# Patient Record
Sex: Female | Born: 1955 | Race: White | Hispanic: No | Marital: Married | Smoking: Former smoker
Health system: Southern US, Community
[De-identification: ages and names within clinical notes are randomized; demographics above are authoritative.]

## PROBLEM LIST (undated history)

## (undated) DIAGNOSIS — K580 Irritable bowel syndrome with diarrhea: Secondary | ICD-10-CM

## (undated) DIAGNOSIS — M179 Osteoarthritis of knee, unspecified: Secondary | ICD-10-CM

## (undated) DIAGNOSIS — R4189 Other symptoms and signs involving cognitive functions and awareness: Secondary | ICD-10-CM

## (undated) DIAGNOSIS — J45909 Unspecified asthma, uncomplicated: Secondary | ICD-10-CM

## (undated) DIAGNOSIS — I619 Nontraumatic intracerebral hemorrhage, unspecified: Secondary | ICD-10-CM

## (undated) DIAGNOSIS — Z87410 Personal history of cervical dysplasia: Secondary | ICD-10-CM

## (undated) DIAGNOSIS — K921 Melena: Secondary | ICD-10-CM

## (undated) DIAGNOSIS — G919 Hydrocephalus, unspecified: Secondary | ICD-10-CM

## (undated) DIAGNOSIS — Z86000 Personal history of in-situ neoplasm of breast: Secondary | ICD-10-CM

## (undated) DIAGNOSIS — I671 Cerebral aneurysm, nonruptured: Secondary | ICD-10-CM

## (undated) DIAGNOSIS — K219 Gastro-esophageal reflux disease without esophagitis: Secondary | ICD-10-CM

## (undated) DIAGNOSIS — H40033 Anatomical narrow angle, bilateral: Secondary | ICD-10-CM

## (undated) DIAGNOSIS — M810 Age-related osteoporosis without current pathological fracture: Secondary | ICD-10-CM

## (undated) DIAGNOSIS — K649 Unspecified hemorrhoids: Secondary | ICD-10-CM

## (undated) DIAGNOSIS — I69391 Dysphagia following cerebral infarction: Secondary | ICD-10-CM

## (undated) DIAGNOSIS — I609 Nontraumatic subarachnoid hemorrhage, unspecified: Secondary | ICD-10-CM

## (undated) DIAGNOSIS — I67848 Other cerebrovascular vasospasm and vasoconstriction: Secondary | ICD-10-CM

## (undated) DIAGNOSIS — I729 Aneurysm of unspecified site: Secondary | ICD-10-CM

## (undated) DIAGNOSIS — G8320 Monoplegia of upper limb affecting unspecified side: Secondary | ICD-10-CM

## (undated) HISTORY — PX: VENTRICULO-PERITONEAL SHUNT PLACEMENT / LAPAROSCOPIC INSERTION PERITONEAL CATHETER: SUR1040

## (undated) HISTORY — PX: MASTECTOMY: SHX3

## (undated) HISTORY — PX: BRAIN SURGERY: SHX531

## (undated) HISTORY — PX: TUBAL LIGATION: SHX77

## (undated) HISTORY — PX: OTHER SURGICAL HISTORY: SHX169

## (undated) HISTORY — PX: CHOLECYSTECTOMY: SHX55

## (undated) HISTORY — PX: MOHS SURGERY: SHX181

## (undated) HISTORY — PX: BREAST SURGERY: SHX581

---

## 1983-05-15 DIAGNOSIS — R87619 Unspecified abnormal cytological findings in specimens from cervix uteri: Secondary | ICD-10-CM | POA: Insufficient documentation

## 1983-05-15 DIAGNOSIS — Z87410 Personal history of cervical dysplasia: Secondary | ICD-10-CM | POA: Insufficient documentation

## 1999-04-18 ENCOUNTER — Other Ambulatory Visit: Admission: RE | Admit: 1999-04-18 | Discharge: 1999-04-18 | Payer: Self-pay | Admitting: Obstetrics and Gynecology

## 2000-02-14 ENCOUNTER — Other Ambulatory Visit: Admission: RE | Admit: 2000-02-14 | Discharge: 2000-02-14 | Payer: Self-pay | Admitting: Obstetrics and Gynecology

## 2005-05-14 DIAGNOSIS — B029 Zoster without complications: Secondary | ICD-10-CM | POA: Insufficient documentation

## 2013-03-24 DIAGNOSIS — K580 Irritable bowel syndrome with diarrhea: Secondary | ICD-10-CM | POA: Insufficient documentation

## 2013-03-24 DIAGNOSIS — K219 Gastro-esophageal reflux disease without esophagitis: Secondary | ICD-10-CM | POA: Insufficient documentation

## 2013-11-19 DIAGNOSIS — K5900 Constipation, unspecified: Secondary | ICD-10-CM | POA: Insufficient documentation

## 2014-06-14 HISTORY — PX: COLONOSCOPY W/ POLYPECTOMY: SHX1380

## 2014-11-11 DIAGNOSIS — K649 Unspecified hemorrhoids: Secondary | ICD-10-CM | POA: Insufficient documentation

## 2014-11-11 DIAGNOSIS — K602 Anal fissure, unspecified: Secondary | ICD-10-CM | POA: Insufficient documentation

## 2014-12-14 DIAGNOSIS — H40033 Anatomical narrow angle, bilateral: Secondary | ICD-10-CM | POA: Insufficient documentation

## 2016-02-07 DIAGNOSIS — Z86010 Personal history of colonic polyps: Secondary | ICD-10-CM | POA: Insufficient documentation

## 2017-11-11 HISTORY — PX: COLONOSCOPY: SHX174

## 2020-01-05 ENCOUNTER — Emergency Department (HOSPITAL_COMMUNITY): Payer: BC Managed Care – PPO

## 2020-01-05 ENCOUNTER — Inpatient Hospital Stay (HOSPITAL_COMMUNITY): Payer: BC Managed Care – PPO

## 2020-01-05 ENCOUNTER — Other Ambulatory Visit: Payer: Self-pay

## 2020-01-05 ENCOUNTER — Inpatient Hospital Stay (HOSPITAL_COMMUNITY)
Admission: EM | Admit: 2020-01-05 | Discharge: 2020-01-20 | DRG: 020 | Disposition: A | Discharge status: Rehabilitation Facility | Payer: BC Managed Care – PPO | Attending: Neurosurgery | Admitting: Neurosurgery

## 2020-01-05 DIAGNOSIS — E871 Hypo-osmolality and hyponatremia: Secondary | ICD-10-CM | POA: Diagnosis not present

## 2020-01-05 DIAGNOSIS — I671 Cerebral aneurysm, nonruptured: Secondary | ICD-10-CM

## 2020-01-05 DIAGNOSIS — D696 Thrombocytopenia, unspecified: Secondary | ICD-10-CM | POA: Diagnosis present

## 2020-01-05 DIAGNOSIS — Z8249 Family history of ischemic heart disease and other diseases of the circulatory system: Secondary | ICD-10-CM

## 2020-01-05 DIAGNOSIS — N390 Urinary tract infection, site not specified: Secondary | ICD-10-CM | POA: Diagnosis not present

## 2020-01-05 DIAGNOSIS — I619 Nontraumatic intracerebral hemorrhage, unspecified: Secondary | ICD-10-CM | POA: Diagnosis present

## 2020-01-05 DIAGNOSIS — D62 Acute posthemorrhagic anemia: Secondary | ICD-10-CM | POA: Diagnosis not present

## 2020-01-05 DIAGNOSIS — Z20822 Contact with and (suspected) exposure to covid-19: Secondary | ICD-10-CM | POA: Diagnosis present

## 2020-01-05 DIAGNOSIS — I672 Cerebral atherosclerosis: Secondary | ICD-10-CM | POA: Diagnosis present

## 2020-01-05 DIAGNOSIS — R1310 Dysphagia, unspecified: Secondary | ICD-10-CM | POA: Diagnosis not present

## 2020-01-05 DIAGNOSIS — K922 Gastrointestinal hemorrhage, unspecified: Secondary | ICD-10-CM | POA: Diagnosis not present

## 2020-01-05 DIAGNOSIS — Y831 Surgical operation with implant of artificial internal device as the cause of abnormal reaction of the patient, or of later complication, without mention of misadventure at the time of the procedure: Secondary | ICD-10-CM | POA: Diagnosis not present

## 2020-01-05 DIAGNOSIS — Z4659 Encounter for fitting and adjustment of other gastrointestinal appliance and device: Secondary | ICD-10-CM

## 2020-01-05 DIAGNOSIS — I610 Nontraumatic intracerebral hemorrhage in hemisphere, subcortical: Secondary | ICD-10-CM | POA: Diagnosis not present

## 2020-01-05 DIAGNOSIS — I611 Nontraumatic intracerebral hemorrhage in hemisphere, cortical: Secondary | ICD-10-CM

## 2020-01-05 DIAGNOSIS — R195 Other fecal abnormalities: Secondary | ICD-10-CM | POA: Diagnosis not present

## 2020-01-05 DIAGNOSIS — R7303 Prediabetes: Secondary | ICD-10-CM | POA: Diagnosis not present

## 2020-01-05 DIAGNOSIS — I609 Nontraumatic subarachnoid hemorrhage, unspecified: Secondary | ICD-10-CM | POA: Diagnosis not present

## 2020-01-05 DIAGNOSIS — R29721 NIHSS score 21: Secondary | ICD-10-CM | POA: Diagnosis present

## 2020-01-05 DIAGNOSIS — Z801 Family history of malignant neoplasm of trachea, bronchus and lung: Secondary | ICD-10-CM

## 2020-01-05 DIAGNOSIS — E876 Hypokalemia: Secondary | ICD-10-CM | POA: Diagnosis present

## 2020-01-05 DIAGNOSIS — R799 Abnormal finding of blood chemistry, unspecified: Secondary | ICD-10-CM | POA: Diagnosis not present

## 2020-01-05 DIAGNOSIS — I607 Nontraumatic subarachnoid hemorrhage from unspecified intracranial artery: Principal | ICD-10-CM | POA: Diagnosis present

## 2020-01-05 DIAGNOSIS — I63522 Cerebral infarction due to unspecified occlusion or stenosis of left anterior cerebral artery: Secondary | ICD-10-CM | POA: Diagnosis not present

## 2020-01-05 DIAGNOSIS — G9349 Other encephalopathy: Secondary | ICD-10-CM | POA: Diagnosis not present

## 2020-01-05 DIAGNOSIS — G8321 Monoplegia of upper limb affecting right dominant side: Secondary | ICD-10-CM | POA: Diagnosis not present

## 2020-01-05 DIAGNOSIS — R000 Tachycardia, unspecified: Secondary | ICD-10-CM

## 2020-01-05 DIAGNOSIS — R739 Hyperglycemia, unspecified: Secondary | ICD-10-CM | POA: Diagnosis present

## 2020-01-05 DIAGNOSIS — G8104 Flaccid hemiplegia affecting left nondominant side: Secondary | ICD-10-CM | POA: Diagnosis present

## 2020-01-05 DIAGNOSIS — G9340 Encephalopathy, unspecified: Secondary | ICD-10-CM | POA: Diagnosis not present

## 2020-01-05 DIAGNOSIS — R569 Unspecified convulsions: Secondary | ICD-10-CM | POA: Diagnosis not present

## 2020-01-05 DIAGNOSIS — Z87891 Personal history of nicotine dependence: Secondary | ICD-10-CM

## 2020-01-05 DIAGNOSIS — K921 Melena: Secondary | ICD-10-CM | POA: Diagnosis not present

## 2020-01-05 DIAGNOSIS — R609 Edema, unspecified: Secondary | ICD-10-CM | POA: Diagnosis not present

## 2020-01-05 DIAGNOSIS — Z781 Physical restraint status: Secondary | ICD-10-CM | POA: Diagnosis not present

## 2020-01-05 DIAGNOSIS — I615 Nontraumatic intracerebral hemorrhage, intraventricular: Secondary | ICD-10-CM | POA: Diagnosis present

## 2020-01-05 DIAGNOSIS — R509 Fever, unspecified: Secondary | ICD-10-CM

## 2020-01-05 DIAGNOSIS — D500 Iron deficiency anemia secondary to blood loss (chronic): Secondary | ICD-10-CM | POA: Diagnosis not present

## 2020-01-05 DIAGNOSIS — B964 Proteus (mirabilis) (morganii) as the cause of diseases classified elsewhere: Secondary | ICD-10-CM | POA: Diagnosis not present

## 2020-01-05 DIAGNOSIS — M7989 Other specified soft tissue disorders: Secondary | ICD-10-CM | POA: Diagnosis not present

## 2020-01-05 DIAGNOSIS — I69391 Dysphagia following cerebral infarction: Secondary | ICD-10-CM | POA: Diagnosis not present

## 2020-01-05 DIAGNOSIS — I97821 Postprocedural cerebrovascular infarction during other surgery: Secondary | ICD-10-CM | POA: Diagnosis not present

## 2020-01-05 DIAGNOSIS — I67848 Other cerebrovascular vasospasm and vasoconstriction: Secondary | ICD-10-CM | POA: Diagnosis not present

## 2020-01-05 DIAGNOSIS — R4189 Other symptoms and signs involving cognitive functions and awareness: Secondary | ICD-10-CM | POA: Diagnosis not present

## 2020-01-05 DIAGNOSIS — R471 Dysarthria and anarthria: Secondary | ICD-10-CM | POA: Diagnosis not present

## 2020-01-05 DIAGNOSIS — R159 Full incontinence of feces: Secondary | ICD-10-CM | POA: Diagnosis not present

## 2020-01-05 DIAGNOSIS — I639 Cerebral infarction, unspecified: Secondary | ICD-10-CM | POA: Diagnosis not present

## 2020-01-05 DIAGNOSIS — K2100 Gastro-esophageal reflux disease with esophagitis, without bleeding: Secondary | ICD-10-CM | POA: Diagnosis not present

## 2020-01-05 LAB — COMPREHENSIVE METABOLIC PANEL
ALT: 20 U/L (ref 0–44)
AST: 26 U/L (ref 15–41)
Albumin: 3.9 g/dL (ref 3.5–5.0)
Alkaline Phosphatase: 74 U/L (ref 38–126)
Anion gap: 12 (ref 5–15)
BUN: 10 mg/dL (ref 8–23)
CO2: 25 mmol/L (ref 22–32)
Calcium: 9 mg/dL (ref 8.9–10.3)
Chloride: 102 mmol/L (ref 98–111)
Creatinine, Ser: 0.65 mg/dL (ref 0.44–1.00)
GFR calc Af Amer: >60 mL/min (ref >60)
GFR calc non Af Amer: >60 mL/min (ref >60)
Glucose, Bld: 168 mg/dL — ABNORMAL HIGH (ref 70–99)
Potassium: 3.4 mmol/L — ABNORMAL LOW (ref 3.5–5.1)
Sodium: 139 mmol/L (ref 135–145)
Total Bilirubin: 0.8 mg/dL (ref 0.3–1.2)
Total Protein: 6.4 g/dL — ABNORMAL LOW (ref 6.5–8.1)

## 2020-01-05 LAB — SARS CORONAVIRUS 2 BY RT PCR (HOSPITAL ORDER, PERFORMED IN ~~LOC~~ HOSPITAL LAB): SARS Coronavirus 2: NEGATIVE

## 2020-01-05 LAB — PROTIME-INR
INR: 1 (ref 0.8–1.2)
INR: 1 (ref 0.8–1.2)
Prothrombin Time: 12.5 seconds (ref 11.4–15.2)
Prothrombin Time: 12.3 seconds (ref 11.4–15.2)

## 2020-01-05 LAB — CBC
HCT: 39 % (ref 36.0–46.0)
Hemoglobin: 12.2 g/dL (ref 12.0–15.0)
MCH: 30 pg (ref 26.0–34.0)
MCHC: 31.3 g/dL (ref 30.0–36.0)
MCV: 96.1 fL (ref 80.0–100.0)
Platelets: 329 10*3/uL (ref 150–400)
RBC: 4.06 MIL/uL (ref 3.87–5.11)
RDW: 12.3 % (ref 11.5–15.5)
WBC: 9 10*3/uL (ref 4.0–10.5)
nRBC: 0 % (ref 0.0–0.2)

## 2020-01-05 LAB — ETHANOL: Alcohol, Ethyl (B): <10 mg/dL (ref <10)

## 2020-01-05 LAB — DIFFERENTIAL
Abs Immature Granulocytes: 0.1 10*3/uL — ABNORMAL HIGH (ref 0.00–0.07)
Basophils Absolute: 0 10*3/uL (ref 0.0–0.1)
Basophils Relative: 0 %
Eosinophils Absolute: 0.3 10*3/uL (ref 0.0–0.5)
Eosinophils Relative: 4 %
Immature Granulocytes: 1 %
Lymphocytes Relative: 41 %
Lymphs Abs: 3.7 10*3/uL (ref 0.7–4.0)
Monocytes Absolute: 0.5 10*3/uL (ref 0.1–1.0)
Monocytes Relative: 6 %
Neutro Abs: 4.3 10*3/uL (ref 1.7–7.7)
Neutrophils Relative %: 48 %

## 2020-01-05 LAB — I-STAT CHEM 8, ED
BUN: 12 mg/dL (ref 8–23)
Calcium, Ion: 1.1 mmol/L — ABNORMAL LOW (ref 1.15–1.40)
Chloride: 104 mmol/L (ref 98–111)
Creatinine, Ser: 0.6 mg/dL (ref 0.44–1.00)
Glucose, Bld: 166 mg/dL — ABNORMAL HIGH (ref 70–99)
HCT: 37 % (ref 36.0–46.0)
Hemoglobin: 12.6 g/dL (ref 12.0–15.0)
Potassium: 3.3 mmol/L — ABNORMAL LOW (ref 3.5–5.1)
Sodium: 139 mmol/L (ref 135–145)
TCO2: 25 mmol/L (ref 22–32)

## 2020-01-05 LAB — HEMOGLOBIN A1C
Hgb A1c MFr Bld: 6 % — ABNORMAL HIGH (ref 4.8–5.6)
Mean Plasma Glucose: 126 mg/dL

## 2020-01-05 LAB — GLUCOSE, CAPILLARY
Glucose-Capillary: 130 mg/dL — ABNORMAL HIGH (ref 70–99)
Glucose-Capillary: 134 mg/dL — ABNORMAL HIGH (ref 70–99)

## 2020-01-05 LAB — LIPID PANEL
Cholesterol: 241 mg/dL — ABNORMAL HIGH (ref 0–200)
HDL: 89 mg/dL (ref >40)
LDL Cholesterol: 125 mg/dL — ABNORMAL HIGH (ref 0–99)
Total CHOL/HDL Ratio: 2.7 RATIO
Triglycerides: 137 mg/dL (ref <150)
VLDL: 27 mg/dL (ref 0–40)

## 2020-01-05 LAB — HIV ANTIBODY (ROUTINE TESTING W REFLEX): HIV Screen 4th Generation wRfx: NONREACTIVE

## 2020-01-05 LAB — APTT
aPTT: 22 seconds — ABNORMAL LOW (ref 24–36)
aPTT: 22 seconds — ABNORMAL LOW (ref 24–36)

## 2020-01-05 LAB — CBG MONITORING, ED: Glucose-Capillary: 154 mg/dL — ABNORMAL HIGH (ref 70–99)

## 2020-01-05 LAB — MRSA PCR SCREENING: MRSA by PCR: NEGATIVE

## 2020-01-05 MED ORDER — ASPIRIN 325 MG PO TABS: 325.0000 mg | ORAL_TABLET | Freq: Every day | ORAL | Status: DC

## 2020-01-05 MED ORDER — CLOPIDOGREL BISULFATE 75 MG PO TABS
300.0000 mg | ORAL_TABLET | Freq: Once | ORAL | Status: AC
  Administered 2020-01-05: 300 mg via ORAL
  Filled 2020-01-05: qty 4

## 2020-01-05 MED ORDER — POTASSIUM CHLORIDE IN NACL 20-0.9 MEQ/L-% IV SOLN
INTRAVENOUS | Status: AC
  Filled 2020-01-05 (×2): qty 1000

## 2020-01-05 MED ORDER — CLEVIDIPINE BUTYRATE 0.5 MG/ML IV EMUL
0.0000 mg/h | INTRAVENOUS | Status: DC
  Administered 2020-01-05 – 2020-01-06 (×2): 1 mg/h via INTRAVENOUS
  Filled 2020-01-05 (×2): qty 50

## 2020-01-05 MED ORDER — CLOPIDOGREL BISULFATE 75 MG PO TABS: 75.0000 mg | ORAL_TABLET | Freq: Every day | ORAL | Status: DC

## 2020-01-05 MED ORDER — ACETAMINOPHEN 325 MG PO TABS
650.0000 mg | ORAL_TABLET | ORAL | Status: DC | PRN
  Administered 2020-01-05 – 2020-01-19 (×5): 650 mg via ORAL
  Filled 2020-01-05 (×5): qty 2

## 2020-01-05 MED ORDER — CLOPIDOGREL BISULFATE 75 MG PO TABS
75.0000 mg | ORAL_TABLET | Freq: Every day | ORAL | Status: DC
  Filled 2020-01-05: qty 1

## 2020-01-05 MED ORDER — SODIUM CHLORIDE 0.9% FLUSH: 3.0000 mL | Freq: Once | INTRAVENOUS | Status: AC

## 2020-01-05 MED ORDER — ACETAMINOPHEN 160 MG/5ML PO SOLN
650.0000 mg | ORAL | Status: DC | PRN
  Administered 2020-01-08 – 2020-01-20 (×16): 650 mg
  Filled 2020-01-05 (×16): qty 20.3

## 2020-01-05 MED ORDER — PANTOPRAZOLE SODIUM 40 MG IV SOLR
40.0000 mg | Freq: Every day | INTRAVENOUS | Status: DC
  Administered 2020-01-05 – 2020-01-07 (×3): 40 mg via INTRAVENOUS
  Filled 2020-01-05 (×3): qty 40

## 2020-01-05 MED ORDER — NIMODIPINE 30 MG PO CAPS
60.0000 mg | ORAL_CAPSULE | ORAL | Status: DC
  Filled 2020-01-05: qty 2

## 2020-01-05 MED ORDER — STROKE: EARLY STAGES OF RECOVERY BOOK: Freq: Once | Status: AC

## 2020-01-05 MED ORDER — NIMODIPINE 6 MG/ML PO SOLN
60.0000 mg | ORAL | Status: DC
  Administered 2020-01-05 – 2020-01-06 (×4): 60 mg via ORAL
  Filled 2020-01-05 (×5): qty 10

## 2020-01-05 MED ORDER — SODIUM CHLORIDE 0.9 % IV SOLN
INTRAVENOUS | Status: DC | PRN
  Administered 2020-01-05 – 2020-01-06 (×2): 1000 mL via INTRAVENOUS

## 2020-01-05 MED ORDER — GADOBUTROL 1 MMOL/ML IV SOLN
8.5000 mL | Freq: Once | INTRAVENOUS | Status: AC | PRN
  Administered 2020-01-05: 8.5 mL via INTRAVENOUS

## 2020-01-05 MED ORDER — ASPIRIN 325 MG PO TABS
650.0000 mg | ORAL_TABLET | Freq: Once | ORAL | Status: AC
  Administered 2020-01-05: 650 mg via ORAL
  Filled 2020-01-05: qty 2

## 2020-01-05 MED ORDER — ACETAMINOPHEN 650 MG RE SUPP
650.0000 mg | RECTAL | Status: DC | PRN
  Filled 2020-01-05: qty 1

## 2020-01-05 MED ORDER — SENNOSIDES-DOCUSATE SODIUM 8.6-50 MG PO TABS
1.0000 | ORAL_TABLET | Freq: Two times a day (BID) | ORAL | Status: DC
  Administered 2020-01-05 – 2020-01-09 (×6): 1 via ORAL
  Filled 2020-01-05 (×6): qty 1

## 2020-01-05 MED ORDER — LEVETIRACETAM IN NACL 500 MG/100ML IV SOLN
500.0000 mg | Freq: Two times a day (BID) | INTRAVENOUS | Status: DC
  Administered 2020-01-05 – 2020-01-09 (×9): 500 mg via INTRAVENOUS
  Filled 2020-01-05 (×8): qty 100

## 2020-01-05 MED ORDER — CHLORHEXIDINE GLUCONATE CLOTH 2 % EX PADS
6.0000 | MEDICATED_PAD | Freq: Every day | CUTANEOUS | Status: DC
  Administered 2020-01-07 – 2020-01-20 (×13): 6 via TOPICAL

## 2020-01-05 MED ORDER — ASPIRIN 325 MG PO TABS
325.0000 mg | ORAL_TABLET | Freq: Every day | ORAL | Status: DC
  Filled 2020-01-05: qty 1

## 2020-01-05 MED ORDER — INSULIN ASPART 100 UNIT/ML ~~LOC~~ SOLN: 0.0000 [IU] | SUBCUTANEOUS | Status: DC

## 2020-01-05 MED ORDER — IOHEXOL 350 MG/ML SOLN
75.0000 mL | Freq: Once | INTRAVENOUS | Status: AC | PRN
  Administered 2020-01-05: 75 mL via INTRAVENOUS

## 2020-01-05 NOTE — Consult Note (Addendum)
Chief Complaint   Chief Complaint  Patient presents with  . Code Stroke    HPI   Consult requested by: Dr Cheral Marker, Neurology Reason for consult: intracranial aneurysm  HPI: Erika Stanton is a 64 y.o. female with no known past medical history who presented to the ED as a code stroke after acute onset headache and left hemiplegia. LKN 9:10am. 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 A NSY consultation was requested. I came by immediately to evaluate the patient.  Healthy otherwise, not on any medications. Prior smoking history, quit 7 years ago. No family history of intracranial aneurysm. Not on any blood thinning medications.  Patient Active Problem List   Diagnosis Date Noted  . ICH (intracerebral hemorrhage) ([place]) 01/05/2020    PMH: No past medical history on file.  PSH: (Not in a hospital admission)   SH: Social History   Tobacco Use  . Smoking status: Not on file  Substance Use Topics  . Alcohol use: Not on file  . Drug use: Not on file    MEDS: Prior to Admission medications   Not on File    ALLERGY: Not on File  Social History   Tobacco Use  . Smoking status: Not on file  Substance Use Topics  . Alcohol use: Not on file     No family history on file.   ROS   Review of Systems  Constitutional: Negative.   HENT: Negative.   Respiratory: Negative.   Cardiovascular: Negative.   Gastrointestinal: Negative for nausea and vomiting.  Genitourinary: Negative.   Musculoskeletal: Negative.   Skin: Negative.   Neurological: Positive for dizziness, focal weakness, weakness and headaches. Negative for tremors, sensory change, seizures and loss of consciousness.    Exam   Vitals:   01/05/20 1110 01/05/20 1120  BP: 131/74 127/70  Pulse: 80 77  Resp: 19 19  Temp:    SpO2: 96% 98%   General appearance: WDWN, NAD, resting  comfortably with eyes closed Eyes: No scleral injection Cardiovascular: Regular rate and rhythm without murmurs, rubs, gallops. No edema or variciosities. Distal pulses normal. Pulmonary: Effort normal, non-labored breathing Musculoskeletal:     Muscle tone upper extremities: Normal    Muscle tone lower extremities: Normal    Motor exam: 5/5 RUE/RLE, 0/5 LUE/RLE Neurological Mental Status:    - Patient is resting with eyes closed, oriented x4. Does not open eyes to command    - Patient is able to give a clear and coherent history. Cranial Nerves    - II: Visual Fields are full. PERRL    - III/IV/VI: EOMI without ptosis or diploplia.     - V: Facial sensation is grossly normal    - VII: Facial movement is symmetric.     - VIII: hearing is intact to voice    - X: Uvula elevates symmetrically    - XI: Shoulder shrug is symmetric.    - XII: tongue is midline without atrophy or fasciculations.  Sensory: Sensation grossly intact to LT RUE, RLE and LLE. Unable to differentiate on LUE  Results - Imaging/Labs   Results for orders placed or performed during the hospital encounter of 01/05/20 (from the past 48 hour(s))  CBG monitoring, ED     Status: Abnormal   Collection Time: 01/05/20  9:55 AM  Result Value Ref Range   Glucose-Capillary 154 (H) 70 - 99 mg/dL  Comment: Glucose reference range applies only to samples taken after fasting for at least 8 hours.  Protime-INR     Status: None   Collection Time: 01/05/20  9:56 AM  Result Value Ref Range   Prothrombin Time 12.5 11.4 - 15.2 seconds   INR 1.0 0.8 - 1.2    Comment: (NOTE) INR goal varies based on device and disease states. Performed at [facility], [place] [address] [place], [place] [address]   APTT     Status: Abnormal   Collection Time: 01/05/20  9:56 AM  Result Value Ref Range   aPTT 22 (L) 24 - 36 seconds    Comment: Performed at [facility] [address], [place] [address]  CBC     Status: None     Collection Time: 01/05/20  9:56 AM  Result Value Ref Range   WBC 9.0 4.0 - 10.5 K/uL   RBC 4.06 3.87 - 5.11 MIL/uL   Hemoglobin 12.2 12.0 - 15.0 g/dL   HCT 39.0 36 - 46 %   MCV 96.1 80.0 - 100.0 fL   MCH 30.0 26.0 - 34.0 pg   MCHC 31.3 30.0 - 36.0 g/dL   RDW 12.3 11.5 - 15.5 %   Platelets 329 150 - 400 K/uL   nRBC 0.0 0.0 - 0.2 %    Comment: Performed at [facility], [place] [address] [place], [place] [address]  Differential     Status: Abnormal   Collection Time: 01/05/20  9:56 AM  Result Value Ref Range   Neutrophils Relative % 48 %   Neutro Abs 4.3 1.7 - 7.7 K/uL   Lymphocytes Relative 41 %   Lymphs Abs 3.7 0.7 - 4.0 K/uL   Monocytes Relative 6 %   Monocytes Absolute 0.5 0 - 1 K/uL   Eosinophils Relative 4 %   Eosinophils Absolute 0.3 0 - 0 K/uL   Basophils Relative 0 %   Basophils Absolute 0.0 0 - 0 K/uL   Immature Granulocytes 1 %   Abs Immature Granulocytes 0.10 (H) 0.00 - 0.07 K/uL    Comment: Performed at [facility] [address], [place], [place] [address]  Comprehensive metabolic panel     Status: Abnormal   Collection Time: 01/05/20  9:56 AM  Result Value Ref Range   Sodium 139 135 - 145 mmol/L   Potassium 3.4 (L) 3.5 - 5.1 mmol/L   Chloride 102 98 - 111 mmol/L   CO2 25 22 - 32 mmol/L   Glucose, Bld 168 (H) 70 - 99 mg/dL    Comment: Glucose reference range applies only to samples taken after fasting for at least 8 hours.   BUN 10 8 - 23 mg/dL   Creatinine, Ser 0.65 0.44 - 1.00 mg/dL   Calcium 9.0 8.9 - 10.3 mg/dL   Total Protein 6.4 (L) 6.5 - 8.1 g/dL   Albumin 3.9 3.5 - 5.0 g/dL   AST 26 15 - 41 U/L   ALT 20 0 - 44 U/L   Alkaline Phosphatase 74 38 - 126 U/L   Total Bilirubin 0.8 0.3 - 1.2 mg/dL   GFR calc non Af Amer >60 >60 mL/min   GFR calc Af Amer >60 >60 mL/min   Anion gap 12 5 - 15    Comment: Performed at [facility], [place] [address] [place], [place] [address]  I-stat chem 8, ED     Status: Abnormal   Collection Time:  01/05/20 10:02 AM  Result Value Ref Range   Sodium 139 135 - 145 mmol/L   Potassium 3.3 (L) 3.5 - 5.1 mmol/L   Chloride 104 98 - 111 mmol/L   BUN 12 8 - 23 mg/dL   Creatinine, Ser 0.60 0.44 - 1.00 mg/dL   Glucose, Bld 166 (H) 70 - 99 mg/dL    Comment: Glucose reference range applies only to samples taken after fasting for at least 8 hours.   Calcium, Ion 1.10 (L) 1.15 - 1.40 mmol/L   TCO2 25 22 - 32 mmol/L   Hemoglobin 12.6 12.0 - 15.0 g/dL   HCT 37.0 36 - 46 %    DG Chest Port 1 View  Result Date: 01/05/2020 CLINICAL DATA:  Code stroke EXAM: PORTABLE CHEST 1 VIEW COMPARISON:  02/11/2008 chest radiograph. FINDINGS: Stable cardiomediastinal silhouette with normal heart size. No pneumothorax. No pleural effusion. Slightly low lung volumes. Mild hazy bibasilar lung opacities. No overt pulmonary edema. Surgical clips are seen in the right upper quadrant of the abdomen. IMPRESSION: Slightly low lung volumes with mild hazy bibasilar lung opacities, favor atelectasis. Electronically Signed   By: Ilona Sorrel M.D.   On: 01/05/2020 10:55   CT HEAD CODE STROKE WO CONTRAST  Result Date: 01/05/2020 CLINICAL DATA:  Code stroke.  64 year old female EXAM: CT HEAD WITHOUT CONTRAST TECHNIQUE: Contiguous axial images were obtained from the base of the skull through the vertex without intravenous contrast. COMPARISON:  None. FINDINGS: Brain: Mild motion artifact despite repeated imaging attempts. Round hyperdense hemorrhage centered at the right basal ganglia is associated with a small volume of extension into the right subarachnoid space (visible along the right sylvian fissure, and a small to moderate volume of intraventricular extension (visible in all ventricles). There is no ventriculomegaly. The intra-axial component of blood in compass is 43 x 33 by 25 mm, foreign estimated volume of 18 mL. Additionally, there is dystrophic calcification along the anterior inferior margin of the hemorrhage, and a suspicious  oval intermediate density (series 3, image 12). See also CTA head today reported separately. There is trace leftward midline shift. Small volume of subarachnoid hemorrhage in the basilar cisterns, more so the right, but the cisterns otherwise remain patent. No superimposed acute cortically based infarct identified. Vascular: Mild Calcified atherosclerosis at the skull base. Skull: Mild motion artifact. No acute osseous abnormality identified. Sinuses/Orbits: Visualized paranasal sinuses and mastoids are clear. Other: No acute orbit or scalp soft tissue finding. ASPECTS [facility] Stroke Program Early CT Score) Total score (0-10 with 10 being normal): Not applicable, acute hemorrhage. IMPRESSION: 1. Positive for acute intracranial hemorrhage. Intra-axial hemorrhage at the right basal ganglia with an estimated volume of 18 mL, plus a small volume of subarachnoid hemorrhage and bilateral intraventricular blood. See also CTA Head today reported separately. 2. Trace leftward midline shift. No ventriculomegaly. Basilar cisterns remain patent. Preliminary report of the above was discussed by telephone with Dr. Cheral Marker on 01/05/2020 at 10:19 . Electronically Signed   By: Genevie Ann M.D.   On: 01/05/2020 10:53   CT ANGIO HEAD CODE STROKE  Result Date: 01/05/2020 CLINICAL DATA:  Code stroke. 64 year old female with acute hemorrhage at the right basal ganglia, small volume SAH and IVH on plain head CT. EXAM: CT ANGIOGRAPHY HEAD TECHNIQUE: Multidetector CT imaging of the head was performed using the standard protocol during bolus administration of intravenous contrast. Multiplanar CT image reconstructions and MIPs were obtained to evaluate the vascular anatomy. CONTRAST:  50mL OMNIPAQUE IOHEXOL 350 MG/ML SOLN  COMPARISON:  Plain head CT today 1008 hours. FINDINGS: Posterior circulation: Patent distal vertebral arteries with mild tortuosity. The right V4 segment is dominant. No distal vertebral plaque or stenosis. Both PICA origins  are patent. Patent vertebrobasilar junction and basilar artery with no stenosis. Normal SCA and PCA origins. Posterior communicating arteries are diminutive or absent. Bilateral PCA branches are within normal limits. Anterior circulation: Tortuous distal cervical right ICA, with a kinked appearance just below the skull base (series 5, image 11). Both ICA siphons are patent without stenosis. Both carotid termini are abnormal. Directed cephalad from the left ICA terminus is a 6 mm saccular aneurysm, nearly inseparable from both the left MCA and ACA origins (series 8, image 25). And a giant aneurysm (20 mm diameter) arises from the right ICA terminus directed cephalad. The sac of this aneurysm is partially calcified, and this is the epicenter of the right basal ganglia intra-axial hemorrhage seen by plain CT. See series 5, image 35 and series 8, image 25. This aneurysm is also inseparable from the proximal right MCA and ACA. Additionally, there is evidence of a small 2 mm aneurysm of the anterior communicating artery directed inferiorly in the midline (series 9, image 95). Bilateral ACA branches are otherwise within normal limits. Both MCA M1 segments and MCA bifurcations are patent and within normal limits. Venous sinuses: Patent. Anatomic variants: None. Review of the MIP images confirms the above findings IMPRESSION: 1. 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 2. 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 3. Underlying mild intracranial atherosclerosis and no large vessel occlusion. Impression #1 was communicated to Dr. Cheral Marker at 10:27 am on 01/05/2020 by text page via the [facility] messaging system. Electronically Signed   By: Genevie Ann M.D.   On: 01/05/2020 10:57   Impression/Plan   64 y.o. female with large ruptured right ICA terminus aneurysm and smaller, but  sizable left ICA terminus aneurysm. There is some intraventricular extension with small amount of SAH. Fisher 2 Hunt Hess 3/4. She is resting comfortably with eyes closed, left hemiplegic, but otherwise following commands, A/O x3 (not to location). She does not need any emergent NS intervention including placement of EVD.   She will need to undergo diagnostic cerebral angiogram and ultimately likely pipeline embolization. Will obtain MRI brain and monitor her overnight and ensure she does not decompensate requiring placement of EVD. Will plan on treatment of large rupture right ICA terminus aneurysm tomorrow am. Will need to be given ASA and Plavix at 0600 tomorrow am in preparation for surgery. - Aneurysm protocol with serial TCDs, keppra, nimotop  Ferne Reus, PA-C [facility] [employer]

## 2020-01-05 NOTE — Evaluation (Signed)
Clinical/Bedside Swallow Evaluation Patient Details  Name: Erika Stanton MRN: [id number] Date of Birth: Nov 25, 1955  Today's Date: 01/05/2020 Time: SLP Start Time (ACUTE ONLY): 1248 SLP Stop Time (ACUTE ONLY): 1302 SLP Time Calculation (min) (ACUTE ONLY): 14 min  Past Medical History: No past medical history on file. Past Surgical History: The histories are not reviewed yet. Please review them in the "History" navigator section and refresh this [place]. HPI:  Pt is a 64 yo female presenting with acute headache and L hemiplegia. CT Head was positive for acute ICH at the R basal ganglia with small volume of SAH and IVH and trace leftward midline shift. CTA revealed a large ruptured R ICA terminus aneurysm and smaller unruptured left ICA terminus aneurysm. No known PMH.    Assessment / Plan / Recommendation Clinical Impression  Pt is drowsy and has mild L-sided facial weakness. She denies any sensory changes during direct testing, but there is mild anterior loss during PO trials out of her L side, with pt unaware. She has reduced awareness overall, having a difficult time trying to decipher if she has cleared her mouth completely with purees. She does not have overt s/s of aspiration, but she also took minimal amounts of POs so she could not be adequately challenged, particularly considering sensory issues noted. Recommend that she remain NPO except for meds (crushed in puree if okay with neurosurgery pre-procedure tomorrow). She could also have a few ice chips at a time with full staff supervision. SLP will plan to re-evaluate after surgery for swallowing. Will f/u at that time for cognitive-linguistic evaluation as well.   SLP Visit Diagnosis: Dysphagia, unspecified (R13.10)    Aspiration Risk  Mild aspiration risk;Moderate aspiration risk    Diet Recommendation NPO except meds;Ice chips PRN after oral care   Medication Administration: Crushed with puree    Other  Recommendations Oral Care  Recommendations: Oral care QID   Follow up Recommendations  (tba)      Frequency and Duration min 2x/week  2 weeks       Prognosis Prognosis for Safe Diet Advancement: Good      Swallow Study   General HPI: Pt is a 64 yo female presenting with acute headache and L hemiplegia. CT Head was positive for acute ICH at the R basal ganglia with small volume of SAH and IVH and trace leftward midline shift. CTA revealed a large ruptured R ICA terminus aneurysm and smaller unruptured left ICA terminus aneurysm. No known PMH.  Type of Study: Bedside Swallow Evaluation Previous Swallow Assessment: none in chart Diet Prior to this Study: NPO Temperature Spikes Noted: No Respiratory Status: Nasal cannula History of Recent Intubation: No Behavior/Cognition: Lethargic/Drowsy;Cooperative;Requires cueing Oral Cavity Assessment: Within Functional Limits Oral Care Completed by SLP: No Oral Cavity - Dentition: Adequate natural dentition Vision:  (keeps eyes closed) Self-Feeding Abilities: Total assist Patient Positioning: Upright in bed Baseline Vocal Quality: Normal Volitional Swallow: Able to elicit    Oral/Motor/Sensory Function Overall Oral Motor/Sensory Function: Mild impairment Facial ROM: Reduced left;Suspected CN VII (facial) dysfunction Facial Symmetry: Abnormal symmetry left;Suspected CN VII (facial) dysfunction Facial Strength: Reduced left;Suspected CN VII (facial) dysfunction Facial Sensation: Reduced left;Suspected CN V (Trigeminal) dysfunction Lingual ROM: Within Functional Limits Lingual Symmetry: Within Functional Limits Lingual Strength: Within Functional Limits Lingual Sensation: Within Functional Limits Velum: Within Functional Limits Mandible: Within Functional Limits   Ice Chips Ice chips: Impaired Presentation: Spoon Oral Phase Impairments: Reduced labial seal Oral Phase Functional Implications: Left anterior spillage;Oral residue  Thin Liquid Thin Liquid:  Impaired Presentation: Straw    Nectar Thick Nectar Thick Liquid: Not tested   Honey Thick Honey Thick Liquid: Not tested   Puree Puree: Impaired Presentation: Spoon Oral Phase Impairments: Reduced labial seal;Poor awareness of bolus   Solid     Solid: Not tested      Osie Bond., M.A. [place] Pager [phone] Office [phone]  01/05/2020,1:19 PM

## 2020-01-05 NOTE — ED Notes (Signed)
Husband -- Ronalee Belts -- [phone]

## 2020-01-05 NOTE — Progress Notes (Signed)
Discussed the patient with Dr. Kathyrn Sheriff. The Neurosurgery service will be placing an order for an ASA/Plavix load and will also possibly transfer the patient to their service. The MRI on my preliminary read appears stable regarding the size of the hematoma. I do not see any contrast leakage through the aneurysm.   Electronically signed: Dr. Kerney Elbe

## 2020-01-05 NOTE — ED Notes (Signed)
Vinnie, PA from neurosurgery at bedside

## 2020-01-05 NOTE — H&P (Addendum)
Neurology history and physical   CC: Right-sided headache along with left-sided flaccidity  History is obtained from: EMS  HPI: Erika Stanton is a 64 y.o. female with no past medical history.  Per husband patient had walked outside then came in to the house complaining of a headache at 9 10 in the morning.  She then had gone into the shower and called for husband due to left arm and left leg weakness.  EMS was called.  On arrival to the ED she was drowsy but still complaining of right-sided headache and left-sided weakness.  On the bridge it was asked where her headache was located, she stated it was along her right forehead.  Her eyes were closed and when asked to open her eyes she attempted with noticeable difficulty. Patient was immediately brought to CT which showed both intraventricular and intraparenchymal hemorrhage.  At that point CTA of head was obtained and neurosurgery was also consulted. 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   LKW: 9:10 tpa given?: no, intracranial bleed Premorbid modified Rankin scale (mRS): 0  NIHSS 1a Level of Conscious.: 0 1b LOC Questions: 2 1c LOC Commands: 0 2 Best Gaze: 2 3 Visual: 2 4 Facial Palsy: 2 5a Motor Arm - left: 4 5b Motor Arm - Right: 0 6a Motor Leg - Left: 4 6b Motor Leg - Right: 0 7 Limb Ataxia: 0 8 Sensory: 2 9 Best Language: 0 10 Dysarthria: 1 11 Extinct. and Inatten.: 2 TOTAL: 21   ICH Score:  Time performed: 10.10 GCS: 1 point Infratentorial origin: No..0 Volume: >30cc is 1 point  Age: 72-0 Intraventricular extension: 1 point Score: 3  No past medical history on file.  No family history on file.   Social History:   has no history on file for tobacco use, alcohol use, and drug use.  Medications  Current Facility-Administered Medications:  .  sodium chloride flush (NS) 0.9 % injection 3 mL, 3 mL, Intravenous, Once, Quintella Reichert, MD No current outpatient medications on file.  ROS:     General ROS: As per HPI. Detailed ROS unobtainable due to AMS.   Exam: Current vital signs: Wt 86.7 kg  Vital signs in last 24 hours: Weight:  [86.7 kg] 86.7 kg (08/24 1000)   Constitutional: Appears well-developed and well-nourished.  Eyes: No scleral injection HENT: No OP obstrucion Head: Normocephalic.  Cardiovascular: Normal rate and regular rhythm.  Respiratory: Effort normal, non-labored breathing GI: Soft.  No distension. There is no tenderness.  Skin: WDI  Neuro: Mental Status: Patient is drowsy/somnolent Speech-hypophonic, sparse but fluent Follows simple commands Cranial Nerves: II: Kept eyes tightly closed. Unable to assess blink to threat. Briefly able to assess pupils, which were 3 mm bilaterally and reactive to light.  III,IV, VI: Eyes conjugately at the midline. No nystagmus.  V:  Sensation present on left VII: Facial movement is symmetric.  VIII: hearing is intact to voice X: Hypophonic speech XI: Head midline XII: No lingual dysarthria Motor: RUE and RLE with normal strength. LUE 0/5. LLE 2/5 with slight reflexive withdrawal to noxious but no volitional movement Sensory: Winces to pain bilaterally but shows left sided negleect Deep Tendon Reflexes: 2+ and symmetric in the biceps Plantars: Upgoing on left  Downgoing on right Cerebellar/Gait: Unable to assess  Labs I have reviewed labs in epic and the results pertinent to this consultation are:   CBC    Component Value Date/Time   WBC 9.0 01/05/2020 0956   RBC 4.06  01/05/2020 0956   HGB 12.6 01/05/2020 1002   HCT 37.0 01/05/2020 1002   PLT 329 01/05/2020 0956   MCV 96.1 01/05/2020 0956   MCH 30.0 01/05/2020 0956   MCHC 31.3 01/05/2020 0956   RDW 12.3 01/05/2020 0956   LYMPHSABS 3.7 01/05/2020 0956   MONOABS 0.5 01/05/2020 0956   EOSABS 0.3 01/05/2020 0956   BASOSABS 0.0 01/05/2020 0956    CMP     Component Value Date/Time   NA 139 01/05/2020 1002   K 3.3 (L) 01/05/2020 1002   CL  104 01/05/2020 1002   GLUCOSE 166 (H) 01/05/2020 1002   BUN 12 01/05/2020 1002   CREATININE 0.60 01/05/2020 1002    Lipid Panel  No results found for: CHOL, TRIG, HDL, CHOLHDL, VLDL, LDLCALC, LDLDIRECT   Imaging I have reviewed the images obtained:  CT-scan of the brain-Positive for acute intracranial hemorrhage.  Intra-axial hemorrhage at the right basal ganglia with an estimated volume of 18 mL, plus a small volume of subarachnoid hemorrhage and bilateral intraventricular blood. Trace leftward midline shift. No ventriculomegaly. Basilar cisterns remain patent.  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  Underlying mild intracranial atherosclerosis and no large vessel cclusion.  [name] PA-C [facility] [phone] 01/05/2020, 10:17 AM    Assessment: 64 year old female presenting to the hospital with right-sided headache and left-sided flaccidity.  CT head was obtained, an revealing Intra-axial hemorrhage at the right basal ganglia with an estimated volume of 18 mL, plus a small volume of subarachnoid hemorrhage and bilateral intraventricular blood, as well as trace leftward midline shift. Patient stated she was on no medications.  CTA was obtained and Neurosurgery was consulted. 1. Exam findings are referable to the right basal ganglia hemorrhage 2. 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 Underlying mild intracranial atherosclerosis with no large vessel occlusion. 3. ICH score: 3 4. Mild hypokalemia 5. Blood glucose in the ED was  elevated at 166 6. Per husband she is healthy with no significant PMHx  Plan: 1. Admitting to the Neuro ICU under the Neurology service 2. Clevidipine for BP control. SBP goal of < 140 3. Neurosurgery consulted and would like to wait 24 hours before any intervention 4. Frequent neuro checks 5. NPO until cleared by speech  6. Monitor daily chemistries to assess for possible SIADH or cerebral salt wasting syndrome 7. MRI brain at 5:00 PM  8. Prophylaxis: DVT prevention- SCDs. GI - Protonix. Bowel - Senokot 9. Code Status: Full Code  10. No antiplatelet medications or anticoagulants.  11. Telemetry 12. Will call Neurosurgery for further input if the patient's exam changes or if repeat imaging reveals extension of hemorrhage 13. IV NS with 20 meq/L of K at 75 cc/hr.  14. CBG q4h  I have seen and examined the patient. I have formulated the assessment and plan. My exam findings were observed and documented by [name], PA Electronically signed: Dr. Kerney Elbe

## 2020-01-05 NOTE — Progress Notes (Addendum)
This RN called the neuro surgery PA on call to verify the new order placed for 650mg  of aspirin and 300mg  of plavix to give tonight. Verbal order to continue with giving the doses of aspirin and plavix tonight to pre-load patient for tomorrow's procedure. Will continue to monitor closely.   CT of head ordered for 10pm as well, verbal order from Neuro surgery PA to d/c order.

## 2020-01-05 NOTE — ED Triage Notes (Signed)
To ED via [facility] from home, was outside with sudden onset of severe headache and left sided flaccidity. On arrival, pt is flaccid on left, pale , diaphoretic. Maintaining airway,

## 2020-01-05 NOTE — ED Provider Notes (Signed)
[facility] EMERGENCY DEPARTMENT Provider Note   CSN: [id number] Arrival date & time: 01/05/20  1093     History No chief complaint on file.   Erika Stanton is a 64 y.o. female.  HPI    Patient presents as a code stroke, with acute onset left-sided deficits. Level 5 caveat secondary to acuity of condition. History is obtained by colleagues, family members via nursing, EMS.  Reportedly patient was at home, in her usual state of health when she had sudden onset of left sided hemiplegia, and headache.  Patient had expedited stroke evaluation, including stat head CT, where I performed my initial evaluation.  Subsequent evaluation performed after the patient returned to her exam room. No past medical history on file.  No past med hx  OB History   No obstetric history on file.     No family history on file.  Social: lives w family, no smoker Home Medications Prior to Admission medications   Not on File    Allergies    Patient has no allergy information on record.  Review of Systems   Review of Systems  Unable to perform ROS: Acuity of condition    Physical Exam Updated Vital Signs Wt 86.7 kg     10:46 AM   Physical Exam Vitals and nursing note reviewed.  Constitutional:      Comments: Withdrawn adult female sitting upright breathing easily  HENT:     Head: Normocephalic and atraumatic.     Nose: No rhinorrhea.  Eyes:     Comments: Patient does not open her eyes consistently, or for prolonged amount of time.  Cardiovascular:     Rate and Rhythm: Normal rate.  Pulmonary:     Effort: Pulmonary effort is normal. No respiratory distress.     Breath sounds: Normal breath sounds.  Abdominal:     General: There is no distension.     Tenderness: There is no abdominal tenderness.  Musculoskeletal:        General: No deformity.  Skin:    General: Skin is warm and dry.  Neurological:     Comments: Left-sided hemiplegia Patient is oriented  x3 No gross facial asymmetry, speech is brief, clear, appropriate. Extraocular exam incomplete.      ED Results / Procedures / Treatments   Labs (all labs ordered are listed, but only abnormal results are displayed) Labs Reviewed  APTT - Abnormal; Notable for the following components:      Result Value   aPTT 22 (*)    All other components within normal limits  DIFFERENTIAL - Abnormal; Notable for the following components:   Abs Immature Granulocytes 0.10 (*)    All other components within normal limits  COMPREHENSIVE METABOLIC PANEL - Abnormal; Notable for the following components:   Potassium 3.4 (*)    Glucose, Bld 168 (*)    Total Protein 6.4 (*)    All other components within normal limits  APTT - Abnormal; Notable for the following components:   aPTT 22 (*)    All other components within normal limits  LIPID PANEL - Abnormal; Notable for the following components:   Cholesterol 241 (*)    LDL Cholesterol 125 (*)    All other components within normal limits  HEMOGLOBIN A1C - Abnormal; Notable for the following components:   Hgb A1c MFr Bld 6.0 (*)    All other components within normal limits  GLUCOSE, CAPILLARY - Abnormal; Notable for the following components:   Glucose-Capillary  130 (*)    All other components within normal limits  GLUCOSE, CAPILLARY - Abnormal; Notable for the following components:   Glucose-Capillary 134 (*)    All other components within normal limits  GLUCOSE, CAPILLARY - Abnormal; Notable for the following components:   Glucose-Capillary 101 (*)    All other components within normal limits  GLUCOSE, CAPILLARY - Abnormal; Notable for the following components:   Glucose-Capillary 111 (*)    All other components within normal limits  I-STAT CHEM 8, ED - Abnormal; Notable for the following components:   Potassium 3.3 (*)    Glucose, Bld 166 (*)    Calcium, Ion 1.10 (*)    All other components within normal limits  CBG MONITORING, ED - Abnormal;  Notable for the following components:   Glucose-Capillary 154 (*)    All other components within normal limits  SARS CORONAVIRUS 2 BY RT PCR (HOSPITAL ORDER, [facility])  MRSA PCR SCREENING  SURGICAL PCR SCREEN  PROTIME-INR  CBC  HIV ANTIBODY (ROUTINE TESTING W REFLEX)  PROTIME-INR  ETHANOL  RAPID URINE DRUG SCREEN, HOSP PERFORMED    EKG EKG Interpretation  Date/Time:  Tuesday January 05 2020 10:27:19 EDT Ventricular Rate:  78 PR Interval:    QRS Duration: 103 QT Interval:  422 QTC Calculation: 481 R Axis:   13 Text Interpretation: Sinus rhythm Low voltage, precordial leads Borderline T abnormalities, diffuse leads Abnormal ECG Confirmed by Carmin Muskrat [phone]) on 01/05/2020 10:49:10 AM   Radiology MR BRAIN W WO CONTRAST  Result Date: 01/05/2020 CLINICAL DATA:  Intracranial hemorrhage EXAM: MRI HEAD WITHOUT AND WITH CONTRAST TECHNIQUE: Multiplanar, multiecho pulse sequences of the brain and surrounding structures were obtained without and with intravenous contrast. CONTRAST:  8.53mL GADAVIST GADOBUTROL 1 MMOL/ML IV SOLN COMPARISON:  Correlation made with prior CT FINDINGS: Brain: Area of parenchymal hemorrhage centered in the right basal ganglia and adjacent white matter measuring approximately 4.1 x 3.1 x 2.8 cm. Surrounding edema is present. Together, there is effacement of the adjacent right lateral ventricle. Leftward midline shift remains trace. Intraventricular extension of hemorrhage is present. Subarachnoid extension into the right sylvian fissure is also noted. These findings are similar to the prior CT. There is no acute infarction. A few patchy foci of T2 hyperintensity in the supratentorial white matter nonspecific but may reflect mild chronic microvascular ischemic changes. There is no abnormal parenchymal enhancement. 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 Additional outpouching adjacent  to the left carotid terminus corresponds to aneurysm seen on CTA. Major vessel flow voids at the skull base are preserved. Skull and upper cervical spine: Normal marrow signal is preserved. Sinuses/Orbits: Paranasal sinuses are aerated. Orbits are unremarkable. Other: Sella is unremarkable.  Mastoid air cells are clear. IMPRESSION: Parenchymal hemorrhage centered in the right basal ganglia and adjacent white matter with sulcal subarachnoid/intraventricular extension and mild regional mass effect. Appearance is similar to the prior CT. 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 Electronically Signed   By: Macy Mis M.D.   On: 01/05/2020 18:45   DG Chest Port 1 View  Result Date: 01/05/2020 CLINICAL DATA:  Code stroke EXAM: PORTABLE CHEST 1 VIEW COMPARISON:  02/11/2008 chest radiograph. FINDINGS: Stable cardiomediastinal silhouette with normal heart size. No pneumothorax. No pleural effusion. Slightly low lung volumes. Mild hazy bibasilar lung opacities. No overt pulmonary edema. Surgical clips are seen in the  right upper quadrant of the abdomen. IMPRESSION: Slightly low lung volumes with mild hazy bibasilar lung opacities, favor atelectasis. Electronically Signed   By: Ilona Sorrel M.D.   On: 01/05/2020 10:55   CT HEAD CODE STROKE WO CONTRAST  Result Date: 01/05/2020 CLINICAL DATA:  Code stroke.  64 year old female EXAM: CT HEAD WITHOUT CONTRAST TECHNIQUE: Contiguous axial images were obtained from the base of the skull through the vertex without intravenous contrast. COMPARISON:  None. FINDINGS: Brain: Mild motion artifact despite repeated imaging attempts. Round hyperdense hemorrhage centered at the right basal ganglia is associated with a small volume of extension into the right subarachnoid space (visible along the right sylvian fissure, and a small to moderate volume of intraventricular extension (visible in all ventricles). There is no  ventriculomegaly. The intra-axial component of blood in compass is 43 x 33 by 25 mm, foreign estimated volume of 18 mL. Additionally, there is dystrophic calcification along the anterior inferior margin of the hemorrhage, and a suspicious oval intermediate density (series 3, image 12). See also CTA head today reported separately. There is trace leftward midline shift. Small volume of subarachnoid hemorrhage in the basilar cisterns, more so the right, but the cisterns otherwise remain patent. No superimposed acute cortically based infarct identified. Vascular: Mild Calcified atherosclerosis at the skull base. Skull: Mild motion artifact. No acute osseous abnormality identified. Sinuses/Orbits: Visualized paranasal sinuses and mastoids are clear. Other: No acute orbit or scalp soft tissue finding. ASPECTS [facility] Stroke Program Early CT Score) Total score (0-10 with 10 being normal): Not applicable, acute hemorrhage. IMPRESSION: 1. Positive for acute intracranial hemorrhage. Intra-axial hemorrhage at the right basal ganglia with an estimated volume of 18 mL, plus a small volume of subarachnoid hemorrhage and bilateral intraventricular blood. See also CTA Head today reported separately. 2. Trace leftward midline shift. No ventriculomegaly. Basilar cisterns remain patent. Preliminary report of the above was discussed by telephone with Dr. Cheral Marker on 01/05/2020 at 10:19 . Electronically Signed   By: Genevie Ann M.D.   On: 01/05/2020 10:53   CT ANGIO HEAD CODE STROKE  Result Date: 01/05/2020 CLINICAL DATA:  Code stroke. 64 year old female with acute hemorrhage at the right basal ganglia, small volume SAH and IVH on plain head CT. EXAM: CT ANGIOGRAPHY HEAD TECHNIQUE: Multidetector CT imaging of the head was performed using the standard protocol during bolus administration of intravenous contrast. Multiplanar CT image reconstructions and MIPs were obtained to evaluate the vascular anatomy. CONTRAST:  29mL OMNIPAQUE IOHEXOL  350 MG/ML SOLN COMPARISON:  Plain head CT today 1008 hours. FINDINGS: Posterior circulation: Patent distal vertebral arteries with mild tortuosity. The right V4 segment is dominant. No distal vertebral plaque or stenosis. Both PICA origins are patent. Patent vertebrobasilar junction and basilar artery with no stenosis. Normal SCA and PCA origins. Posterior communicating arteries are diminutive or absent. Bilateral PCA branches are within normal limits. Anterior circulation: Tortuous distal cervical right ICA, with a kinked appearance just below the skull base (series 5, image 11). Both ICA siphons are patent without stenosis. Both carotid termini are abnormal. Directed cephalad from the left ICA terminus is a 6 mm saccular aneurysm, nearly inseparable from both the left MCA and ACA origins (series 8, image 25). And a giant aneurysm (20 mm diameter) arises from the right ICA terminus directed cephalad. The sac of this aneurysm is partially calcified, and this is the epicenter of the right basal ganglia intra-axial hemorrhage seen by plain CT. See series 5, image 35 and series 8, image 25.  This aneurysm is also inseparable from the proximal right MCA and ACA. Additionally, there is evidence of a small 2 mm aneurysm of the anterior communicating artery directed inferiorly in the midline (series 9, image 95). Bilateral ACA branches are otherwise within normal limits. Both MCA M1 segments and MCA bifurcations are patent and within normal limits. Venous sinuses: Patent. Anatomic variants: None. Review of the MIP images confirms the above findings IMPRESSION: 1. 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 2. 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 3. Underlying mild intracranial atherosclerosis and no large vessel occlusion. Impression #1 was communicated to Dr. Cheral Marker  at 10:27 am on 01/05/2020 by text page via the [facility] messaging system. Electronically Signed   By: Genevie Ann M.D.   On: 01/05/2020 10:57    Procedures Procedures (including critical care time)  Medications Ordered in ED Medications  sodium chloride flush (NS) 0.9 % injection 3 mL (has no administration in time range)    ED Course  I have reviewed the triage vital signs and the nursing notes.  Pertinent labs & imaging results that were available during my care of the patient were reviewed by me and considered in my medical decision making (see chart for details).    MDM Rules/Calculators/A&P                          10:10 AM Review of the images while the patient is having stat CT demonstrates right-sided intracranial hemorrhage.  CT angiography pending.  10:23 AM I discussed the patient's case with our neurosurgical colleagues who will evaluate, intervene as indicated. 10:49 AM Patient starting Cardene drip, blood pressure 143/78.  This adult female presents with sudden onset headache, left-sided hemiplegia.  Absent prior medical issues, and with her description of symptoms immediate concern for bleed, which was demonstrated on head CT, performed expeditiously. Case coordinated with our neurology and neurosurgery colleagues given the patient's neurologic deficits, intracranial hemorrhage.  Patient required admission to the ICU, continuous infusion for blood pressure control.   (Next day chart completion) - Patient is noted to have improved neuro exam in AM, but w concern for her ruptured aneurism, she is preparing to go to the OR w neurosurgery MDM Number of Diagnoses or Management Options Nontraumatic cortical hemorrhage of right cerebral hemisphere [facility]): new, needed workup   Amount and/or Complexity of Data Reviewed Clinical lab tests: reviewed Tests in the radiology section of CPT: reviewed Tests in the medicine section of CPT: reviewed Discussion of test results with the  performing providers: yes Decide to obtain previous medical records or to obtain history from someone other than the patient: yes Obtain history from someone other than the patient: yes Discuss the patient with other providers: yes Independent visualization of images, tracings, or specimens: yes  Risk of Complications, Morbidity, and/or Mortality Presenting problems: high Diagnostic procedures: high Management options: high  Critical Care Total time providing critical care: (35)  Patient Progress Patient progress: stable  Final Clinical Impression(s) / ED Diagnoses Final diagnoses:  Nontraumatic cortical hemorrhage of right cerebral hemisphere [facility])     Carmin Muskrat, MD 01/06/20 [phone]

## 2020-01-05 NOTE — Code Documentation (Signed)
[name] is a 64 yr old woman arriving from [facility] at [phone]. Per pt and EMT, pt was last known well this morning at 0910, when she suddenly had a rt sided headache and left sided flaccidity. Code stroke paged out at [phone]. Pt was a NIHSS of 18 at bridge, for rt sided gaze, left facial droop, hemianopsia, severe Left hemiplegia, dysarthria and L neglect and extinction. Her eyes are shut but she follows commands briskly on rt. Lt pupil is >Rt (77mm vs40mm). Pt taken to CT at 1000. Per neurologist, NCCT demonstrates right sided hemorrhage. CTA ordered and began at 1010. Pt taken back to Trauma room C. Per neurologist, the patients hemorrhage is secondary to ruptured Rt ICA aneurysm. At 1045, pt's BP was noted to be slightly above goal of 140 SBP. Cleviprex gtt initiated at 1 mg per hr at 1047, and titrated to 2 mg per hr at 1049. [name] trying to reach pt's husband. Dr Erika Stanton,( Neurosurgery) to examine pt at 1100. He recommends continued BP control, MRI, f/u CT scan this afternoon, loading pt with asa and plavix for probable coiling tomorrow. Pt will need q 1 hr VS, mNIHSS and pupil checks. Discussed with Erika Stanton. Pt not candidate for TPA or thrombectomy due to [place].

## 2020-01-06 ENCOUNTER — Other Ambulatory Visit: Payer: Self-pay | Admitting: Interventional Radiology

## 2020-01-06 ENCOUNTER — Inpatient Hospital Stay (HOSPITAL_COMMUNITY): Payer: BC Managed Care – PPO | Admitting: Certified Registered Nurse Anesthetist

## 2020-01-06 ENCOUNTER — Inpatient Hospital Stay (HOSPITAL_COMMUNITY): Payer: BC Managed Care – PPO

## 2020-01-06 ENCOUNTER — Encounter (HOSPITAL_COMMUNITY): Payer: Self-pay | Admitting: Neurology

## 2020-01-06 ENCOUNTER — Encounter (HOSPITAL_COMMUNITY)
Admission: EM | Disposition: A | Discharge status: Rehabilitation Facility | Payer: Self-pay | Source: Home / Self Care | Attending: Neurosurgery

## 2020-01-06 HISTORY — PX: IR ANGIOGRAM FOLLOW UP STUDY: IMG697

## 2020-01-06 HISTORY — PX: RADIOLOGY WITH ANESTHESIA: SHX6223

## 2020-01-06 HISTORY — PX: IR 3D INDEPENDENT WKST: IMG2385

## 2020-01-06 HISTORY — PX: IR ANGIO INTRA EXTRACRAN SEL INTERNAL CAROTID BILAT MOD SED: IMG5363

## 2020-01-06 HISTORY — PX: IR ANGIO VERTEBRAL SEL VERTEBRAL UNI L MOD SED: IMG5367

## 2020-01-06 HISTORY — PX: IR NEURO EACH ADD'L AFTER BASIC UNI RIGHT (MS): IMG5374

## 2020-01-06 HISTORY — PX: IR TRANSCATH/EMBOLIZ: IMG695

## 2020-01-06 LAB — SURGICAL PCR SCREEN
MRSA, PCR: NEGATIVE
Staphylococcus aureus: NEGATIVE

## 2020-01-06 LAB — GLUCOSE, CAPILLARY
Glucose-Capillary: 101 mg/dL — ABNORMAL HIGH (ref 70–99)
Glucose-Capillary: 111 mg/dL — ABNORMAL HIGH (ref 70–99)
Glucose-Capillary: 131 mg/dL — ABNORMAL HIGH (ref 70–99)
Glucose-Capillary: 157 mg/dL — ABNORMAL HIGH (ref 70–99)

## 2020-01-06 SURGERY — IR WITH ANESTHESIA
Anesthesia: General

## 2020-01-06 MED ORDER — IOHEXOL 300 MG/ML  SOLN: 150.0000 mL | Freq: Once | INTRAMUSCULAR | Status: DC | PRN

## 2020-01-06 MED ORDER — ACETAMINOPHEN 500 MG PO TABS
1000.0000 mg | ORAL_TABLET | Freq: Once | ORAL | Status: AC
  Administered 2020-01-06: 1000 mg via ORAL

## 2020-01-06 MED ORDER — CHLORHEXIDINE GLUCONATE 0.12 % MT SOLN
OROMUCOSAL | Status: AC
  Administered 2020-01-06: 15 mL
  Filled 2020-01-06: qty 15

## 2020-01-06 MED ORDER — ROCURONIUM BROMIDE 10 MG/ML (PF) SYRINGE
PREFILLED_SYRINGE | INTRAVENOUS | Status: DC | PRN
  Administered 2020-01-06: 20 mg via INTRAVENOUS
  Administered 2020-01-06: 100 mg via INTRAVENOUS
  Administered 2020-01-06 (×2): 20 mg via INTRAVENOUS

## 2020-01-06 MED ORDER — GLYCOPYRROLATE 0.2 MG/ML IJ SOLN
INTRAMUSCULAR | Status: DC | PRN
  Administered 2020-01-06: .2 mg via INTRAVENOUS

## 2020-01-06 MED ORDER — ACETAMINOPHEN 500 MG PO TABS
ORAL_TABLET | ORAL | Status: AC
  Filled 2020-01-06: qty 2

## 2020-01-06 MED ORDER — ACETAMINOPHEN 160 MG/5ML PO SOLN: 325.0000 mg | Freq: Once | ORAL | Status: DC | PRN

## 2020-01-06 MED ORDER — CLEVIDIPINE BUTYRATE 0.5 MG/ML IV EMUL
INTRAVENOUS | Status: AC
  Filled 2020-01-06: qty 50

## 2020-01-06 MED ORDER — AMISULPRIDE (ANTIEMETIC) 5 MG/2ML IV SOLN: 10.0000 mg | Freq: Once | INTRAVENOUS | Status: DC | PRN

## 2020-01-06 MED ORDER — PHENYLEPHRINE HCL (PRESSORS) 10 MG/ML IV SOLN
INTRAVENOUS | Status: DC | PRN
  Administered 2020-01-06 (×4): 80 ug via INTRAVENOUS

## 2020-01-06 MED ORDER — MEPERIDINE HCL 25 MG/ML IJ SOLN: 6.2500 mg | INTRAMUSCULAR | Status: DC | PRN

## 2020-01-06 MED ORDER — CEFAZOLIN SODIUM-DEXTROSE 2-4 GM/100ML-% IV SOLN
INTRAVENOUS | Status: AC
  Filled 2020-01-06: qty 100

## 2020-01-06 MED ORDER — MIDAZOLAM HCL 5 MG/5ML IJ SOLN
INTRAMUSCULAR | Status: DC | PRN
  Administered 2020-01-06: 1 mg via INTRAVENOUS

## 2020-01-06 MED ORDER — ORAL CARE MOUTH RINSE: 15.0000 mL | Freq: Once | OROMUCOSAL | Status: DC

## 2020-01-06 MED ORDER — SODIUM CHLORIDE 0.9 % IV SOLN: INTRAVENOUS | Status: DC | PRN

## 2020-01-06 MED ORDER — PHENYLEPHRINE HCL-NACL 10-0.9 MG/250ML-% IV SOLN
INTRAVENOUS | Status: DC | PRN
  Administered 2020-01-06: 25 ug/min via INTRAVENOUS

## 2020-01-06 MED ORDER — DEXAMETHASONE SODIUM PHOSPHATE 10 MG/ML IJ SOLN
INTRAMUSCULAR | Status: DC | PRN
  Administered 2020-01-06: 5 mg via INTRAVENOUS

## 2020-01-06 MED ORDER — ACETAMINOPHEN 10 MG/ML IV SOLN: 1000.0000 mg | Freq: Once | INTRAVENOUS | Status: DC | PRN

## 2020-01-06 MED ORDER — ASPIRIN 325 MG PO TABS
ORAL_TABLET | ORAL | Status: AC
  Filled 2020-01-06: qty 1

## 2020-01-06 MED ORDER — FENTANYL CITRATE (PF) 250 MCG/5ML IJ SOLN
INTRAMUSCULAR | Status: DC | PRN
  Administered 2020-01-06: 100 ug via INTRAVENOUS

## 2020-01-06 MED ORDER — CLOPIDOGREL BISULFATE 75 MG PO TABS
75.0000 mg | ORAL_TABLET | Freq: Every day | ORAL | Status: DC
  Administered 2020-01-06: 75 mg via ORAL
  Filled 2020-01-06 (×2): qty 1

## 2020-01-06 MED ORDER — SODIUM CHLORIDE 0.9 % IV SOLN: INTRAVENOUS | Status: DC

## 2020-01-06 MED ORDER — FENTANYL CITRATE (PF) 100 MCG/2ML IJ SOLN: 25.0000 ug | INTRAMUSCULAR | Status: DC | PRN

## 2020-01-06 MED ORDER — LIDOCAINE 2% (20 MG/ML) 5 ML SYRINGE
INTRAMUSCULAR | Status: DC | PRN
  Administered 2020-01-06: 100 mg via INTRAVENOUS

## 2020-01-06 MED ORDER — SUGAMMADEX SODIUM 200 MG/2ML IV SOLN
INTRAVENOUS | Status: DC | PRN
  Administered 2020-01-06: 200 mg via INTRAVENOUS

## 2020-01-06 MED ORDER — CHLORHEXIDINE GLUCONATE 0.12 % MT SOLN: 15.0000 mL | Freq: Once | OROMUCOSAL | Status: DC

## 2020-01-06 MED ORDER — NIMODIPINE 6 MG/ML PO SOLN
60.0000 mg | ORAL | Status: DC
  Administered 2020-01-06 – 2020-01-20 (×84): 60 mg
  Filled 2020-01-06 (×76): qty 10

## 2020-01-06 MED ORDER — PROPOFOL 10 MG/ML IV BOLUS
INTRAVENOUS | Status: DC | PRN
  Administered 2020-01-06: 200 mg via INTRAVENOUS

## 2020-01-06 MED ORDER — LACTATED RINGERS IV SOLN: INTRAVENOUS | Status: DC

## 2020-01-06 MED ORDER — LIDOCAINE HCL 1 % IJ SOLN
INTRAMUSCULAR | Status: AC
  Filled 2020-01-06: qty 20

## 2020-01-06 MED ORDER — IOHEXOL 300 MG/ML  SOLN: 100.0000 mL | Freq: Once | INTRAMUSCULAR | Status: DC | PRN

## 2020-01-06 MED ORDER — ESMOLOL HCL 100 MG/10ML IV SOLN
INTRAVENOUS | Status: DC | PRN
  Administered 2020-01-06: 20 mg via INTRAVENOUS

## 2020-01-06 MED ORDER — LABETALOL HCL 5 MG/ML IV SOLN
INTRAVENOUS | Status: DC | PRN
  Administered 2020-01-06: 10 mg via INTRAVENOUS

## 2020-01-06 MED ORDER — ASPIRIN 325 MG PO TABS
325.0000 mg | ORAL_TABLET | Freq: Every day | ORAL | Status: DC
  Administered 2020-01-06: 325 mg via ORAL
  Filled 2020-01-06: qty 1

## 2020-01-06 MED ORDER — HEPARIN SODIUM (PORCINE) 1000 UNIT/ML IJ SOLN
INTRAMUSCULAR | Status: DC | PRN
  Administered 2020-01-06: 1000 [IU] via INTRAVENOUS
  Administered 2020-01-06: 3000 [IU] via INTRAVENOUS

## 2020-01-06 MED ORDER — ACETAMINOPHEN 325 MG PO TABS: 325.0000 mg | ORAL_TABLET | Freq: Once | ORAL | Status: DC | PRN

## 2020-01-06 MED ORDER — ONDANSETRON HCL 4 MG/2ML IJ SOLN
INTRAMUSCULAR | Status: DC | PRN
  Administered 2020-01-06: 4 mg via INTRAVENOUS

## 2020-01-06 NOTE — Anesthesia Procedure Notes (Signed)
Arterial Line Insertion Start/End8/25/2021 10:47 AM, 01/06/2020 11:57 AM Performed by: Duane Boston, MD  Patient location: Pre-op. Preanesthetic checklist: patient identified, IV checked, site marked, risks and benefits discussed, surgical consent, monitors and equipment checked, pre-op evaluation, timeout performed and anesthesia consent Lidocaine 1% used for infiltration Left, radial was placed Catheter size: 20 Fr Hand hygiene performed  and maximum sterile barriers used   Attempts: 1 Procedure performed using ultrasound guided technique. Ultrasound Notes:anatomy identified, needle tip was noted to be adjacent to the nerve/plexus identified and no ultrasound evidence of intravascular and/or intraneural injection Following insertion, dressing applied and Biopatch. Post procedure assessment: normal and unchanged  Patient tolerated the procedure well with no immediate complications.

## 2020-01-06 NOTE — Progress Notes (Signed)
PT Cancellation Note  Patient Details Name: Erika Stanton MRN: [id number] DOB: 1955-06-14   Cancelled Treatment:    Reason Eval/Treat Not Completed: Patient not medically ready. Pt on bedrest and with plans for OR later today. PT to return as able, as appropriate to complete PT eval.  Kittie Plater, PT, DPT Acute Rehabilitation Services Pager #: [phone] Office #: [phone]    Berline Lopes 01/06/2020, 8:42 AM

## 2020-01-06 NOTE — Anesthesia Preprocedure Evaluation (Addendum)
Anesthesia Evaluation  Patient identified by MRN, date of birth, ID band Patient awake    Reviewed: Allergy & Precautions, NPO status , Patient's Chart, lab work & pertinent test results  History of Anesthesia Complications Negative for: history of anesthetic complications  Airway Mallampati: II  TM Distance: >3 FB Neck ROM: Full    Dental no notable dental hx. (+) Dental Advisory Given   Pulmonary neg pulmonary ROS,    Pulmonary exam normal        Cardiovascular negative cardio ROS Normal cardiovascular exam     Neuro/Psych ICH CVA    GI/Hepatic negative GI ROS, Neg liver ROS,   Endo/Other  negative endocrine ROS  Renal/GU negative Renal ROS     Musculoskeletal negative musculoskeletal ROS (+)   Abdominal   Peds  Hematology negative hematology ROS (+)   Anesthesia Other Findings   Reproductive/Obstetrics                            Anesthesia Physical Anesthesia Plan  ASA: III  Anesthesia Plan: General   Post-op Pain Management:    Induction: Intravenous  PONV Risk Score and Plan: 2 and Ondansetron, Dexamethasone and Treatment may vary due to age or medical condition  Airway Management Planned: Oral ETT  Additional Equipment: Arterial line  Intra-op Plan:   Post-operative Plan: Possible Post-op intubation/ventilation  Informed Consent: I have reviewed the patients History and Physical, chart, labs and discussed the procedure including the risks, benefits and alternatives for the proposed anesthesia with the patient or authorized representative who has indicated his/her understanding and acceptance.     Dental advisory given  Plan Discussed with: Anesthesiologist and CRNA  Anesthesia Plan Comments:        Anesthesia Quick Evaluation

## 2020-01-06 NOTE — Consult Note (Deleted)
NAME:  JASHAE WIGGS, MRN:  [id number], DOB:  1955-06-12, LOS: 1 ADMISSION DATE:  01/05/2020, CONSULTATION DATE: 01/06/20  REFERRING MD:  Kerney Elbe, MD, CHIEF COMPLAINT:  Right sided headache, weakness   Brief History   ROSEMOND LYTTLE is a 64 y.o. 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 Since presentation, she has underwent placement of a flow diverting stent with IR.   History of present illness   KAZIA GRISANTI is a 64 y.o. female with no past medical history. Patient had walked outside then returned to the house complaining of a headache at 9/10 in the morning.  She later notified her husband of left arm and left leg weakness.  EMS was called. On arrival to the ED she was drowsy but complained of right-sided headache and left-sided weakness. She reported her headache was located along her right forehead. A STAT head CT was obtained which showed both intraventricular and intraparenchymal hemorrhage. 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   Past Medical History  None  Significant Hospital Events   8/24 - Admitted  Consults:  Neurosurgery Neurology Interventional Radiology  Procedures:  8/25 - Diagnostic cerebral angiogram, stent-supported coil embolization of RICA aneurysm  Significant Diagnostic Tests:  8/24 - CT Head Code Stroke Wo Contrast: Intra-axial hemorrhage at the right basal ganglia with an estimated volume of 18 mL, plus a small volume of subarachnoid hemorrhage and bilateral intraventricular blood.  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  Micro Data:  None  Antimicrobials:  None  Interim history/subjective:  Patient is resting comfortably. Minimally responsive to noxious stimuli. Some spontaneous and purposeful movement.   Objective   Blood pressure (!) 106/59, pulse 91, temperature 97.7 F (36.5 C), resp. rate (!) 21, weight (S) 86.7 kg, SpO2 100 %.        Intake/Output Summary (Last 24 hours) at 01/06/2020 1614 Last data filed at 01/06/2020 1547 Gross per 24 hour  Intake 2574.31 ml  Output 3520 ml  Net -945.69 ml   Filed Weights   01/05/20 1000 01/05/20 1129  Weight: 86.7 kg (S) 86.7 kg    Examination: General: Resting, NG tube in place, well-nourished, no acute distress HENT: Normocephalic, atraumatic, mucus membranes moist Lungs: CTAB, no increased work of breathing Cardiovascular: RRR, Normal S1, S2 Abdomen: NABS, nontender, nondistended, no HSM Extremities: Pulses 2+ and symmetric bilaterally Neuro: Somnolent, not following commands  Resolved Hospital Problem list   N/A  Assessment & Plan:   1. 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 Acute Intracranial Hemorrhage at R Basal Ganglia -Patient is now s/p angiogram and tent-coil embolization of RICA aneurysm  -Clevidipine for BP control, SBP <140 -Continue frequent neuro checks  -Daily chemistries  -Continuous telemetry  -Speech path reevaluation pending, pt currently NPO  -PT/OT eval  -Serial TCDs -Keppra 500 mg IVG -Nimodipine 60 mg PO Q4H -UDS pending   Best practice:  Diet: NPO pending clearance by Speech Pathology Pain/Anxiety/Delirium protocol (if indicated): per protocol VAP protocol (if  indicated): per protocol DVT prophylaxis: pneumatic compression devices/SCDs GI prophylaxis: Protonix Glucose control: N/A Mobility: Bed rest  Code Status: FULL Family Communication: N/A Disposition: ICU  Labs   CBC: Recent  Labs  Lab 01/05/20 0956 01/05/20 1002  WBC 9.0  --   NEUTROABS 4.3  --   HGB 12.2 12.6  HCT 39.0 37.0  MCV 96.1  --   PLT 329  --     Basic Metabolic Panel: Recent Labs  Lab 01/05/20 0956 01/05/20 1002  NA 139 139  K 3.4* 3.3*  CL 102 104  CO2 25  --   GLUCOSE 168* 166*  BUN 10 12  CREATININE 0.65 0.60  CALCIUM 9.0  --    GFR: CrCl cannot be calculated (Unknown ideal weight.). Recent Labs  Lab 01/05/20 0956  WBC 9.0    Liver Function Tests: Recent Labs  Lab 01/05/20 0956  AST 26  ALT 20  ALKPHOS 74  BILITOT 0.8  PROT 6.4*  ALBUMIN 3.9   No results for input(s): LIPASE, AMYLASE in the last 168 hours. No results for input(s): AMMONIA in the last 168 hours.  ABG    Component Value Date/Time   TCO2 25 01/05/2020 1002     Coagulation Profile: Recent Labs  Lab 01/05/20 0956 01/05/20 1211  INR 1.0 1.0    Cardiac Enzymes: No results for input(s): CKTOTAL, CKMB, CKMBINDEX, TROPONINI in the last 168 hours.  HbA1C: Hgb A1c MFr Bld  Date/Time Value Ref Range Status  01/05/2020 12:11 PM 6.0 (H) 4.8 - 5.6 % Final    Comment:    (NOTE)         Prediabetes: 5.7 - 6.4         Diabetes: >6.4         Glycemic control for adults with diabetes: <7.0     CBG: Recent Labs  Lab 01/05/20 0955 01/05/20 1942 01/05/20 2329 01/06/20 0321 01/06/20 0846  GLUCAP 154* 130* 134* 101* 111*    Review of Systems:   Review of Systems  Constitutional: Negative.   HENT: Negative.   Respiratory: Negative.   Cardiovascular: Negative.   Gastrointestinal: Negative for nausea and vomiting.  Genitourinary: Negative.   Musculoskeletal: Negative.   Skin: Negative.   Neurological: Positive for dizziness, focal weakness, weakness and headaches. Negative for tremors, sensory change, seizures and loss of consciousness.   Past Medical History  She  has no past medical history on file.   Surgical History    Past Surgical History:  Procedure Laterality Date  . IR  3D INDEPENDENT WKST  01/06/2020  . IR ANGIO INTRA EXTRACRAN SEL INTERNAL CAROTID BILAT MOD SED  01/06/2020  . IR ANGIO VERTEBRAL SEL VERTEBRAL UNI L MOD SED  01/06/2020  . IR ANGIOGRAM FOLLOW UP STUDY  01/06/2020  . IR ANGIOGRAM FOLLOW UP STUDY  01/06/2020  . IR ANGIOGRAM FOLLOW UP STUDY  01/06/2020  . IR ANGIOGRAM FOLLOW UP STUDY  01/06/2020  . IR ANGIOGRAM FOLLOW UP STUDY  01/06/2020  . IR ANGIOGRAM FOLLOW UP STUDY  01/06/2020  . IR ANGIOGRAM FOLLOW UP STUDY  01/06/2020  . IR ANGIOGRAM FOLLOW UP STUDY  01/06/2020  . IR ANGIOGRAM FOLLOW UP STUDY  01/06/2020  . IR ANGIOGRAM FOLLOW UP STUDY  01/06/2020  . IR NEURO EACH ADD'L AFTER BASIC UNI RIGHT (MS)  01/06/2020  . IR TRANSCATH/EMBOLIZ  01/06/2020     Social History   She has no history on file for tobacco use, alcohol use, and drug use.  Family History   Her family history is not on file.   Allergies Allergies  Allergen Reactions  . Sulfa Antibiotics Rash     Home Medications  Prior to Admission medications   Medication Sig Start Date End Date Taking? Authorizing Provider  sucralfate (CARAFATE) 1 g tablet Take 1 g by mouth 4 (four) times daily -  with meals and at bedtime.   Yes [provider]     Critical care time:

## 2020-01-06 NOTE — Progress Notes (Addendum)
Patient moaning and withdraws to pain to bilateral lower extremities.  On call neurosurgery paged and updated on assessment.  On call neurosurgeon will contact attending neurosurgeon.    1855:  Attending neurosurgeon return page.  Systolic blood pressure goal less than 160.  RN to continue to monitor.

## 2020-01-06 NOTE — Progress Notes (Signed)
  NEUROSURGERY PROGRESS NOTE   No issues overnight. Pt with improved HA and right-sided strength this am.  EXAM:  BP 116/72 (BP Location: Left Arm)   Pulse 67   Temp 98 F (36.7 C) (Oral)   Resp 20   Wt (S) 86.7 kg   SpO2 95%   Awake, alert, oriented  Speech fluent, appropriate  CN grossly intact  5/5 RUE/RLE 2/5 RUE, 3/5 RLE  IMAGING: MRI reviewed with stable left BG hematoma, improved IVH, no HCP. No large territory ischemia/stroke  IMPRESSION:  64 y.o. 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  PLAN: - Will proceed with planned flow-diversion of RICA aneurysm likely with adjuvant coiling - Will give preop dose of ASA/Plavix (loading dose given yesterday pm)  I have again reviewed the situation with the patient and husband. We discussed the need for aneurysm treatment. Options were discussed including my recommendation for flow-diversion. Risks of the procedure were reviewed including stroke leading to worsening weakness/numbness/paralysis/coma/death, bleeding, hematoma, and general risks of anesthesia. Expected postop course and recovery were reviewed. All their questions today were answered and they provided informed consent to proceed.

## 2020-01-06 NOTE — Sedation Documentation (Signed)
Patient is under care of Anesthesiology.  Reporting RN for support.

## 2020-01-06 NOTE — Progress Notes (Signed)
Patient transported back to 4NICU with PACU RN at 1730.

## 2020-01-06 NOTE — Anesthesia Postprocedure Evaluation (Signed)
Anesthesia Post Note  Patient: Erika Stanton  Procedure(s) Performed: IR WITH ANESTHESIA FOR ANEURYSM (N/A )     Patient location during evaluation: PACU Anesthesia Type: General Level of consciousness: awake and alert Pain management: pain level controlled Vital Signs Assessment: post-procedure vital signs reviewed and stable Respiratory status: spontaneous breathing, nonlabored ventilation, respiratory function stable and patient connected to nasal cannula oxygen Cardiovascular status: blood pressure returned to baseline and stable Postop Assessment: no apparent nausea or vomiting Anesthetic complications: no   No complications documented.  Last Vitals:  Vitals:   01/06/20 1655 01/06/20 1658  BP:    Pulse: 88 82  Resp: 19 20  Temp:  36.5 C  SpO2: 100% 98%    Last Pain:  Vitals:   01/06/20 1650  TempSrc:   PainSc: Asleep                 Effie Berkshire

## 2020-01-06 NOTE — Anesthesia Procedure Notes (Signed)
Procedure Name: Intubation Date/Time: 01/06/2020 11:23 AM Performed by: Glynda Jaeger, CRNA Pre-anesthesia Checklist: Patient identified, Patient being monitored, Timeout performed, Emergency Drugs available and Suction available Patient Re-evaluated:Patient Re-evaluated prior to induction Oxygen Delivery Method: Circle System Utilized Preoxygenation: Pre-oxygenation with 100% oxygen Induction Type: IV induction Ventilation: Mask ventilation without difficulty Laryngoscope Size: Mac and 4 Grade View: Grade I Tube type: Oral Tube size: 7.5 mm Number of attempts: 1 Airway Equipment and Method: Stylet Placement Confirmation: ETT inserted through vocal cords under direct vision,  positive ETCO2 and breath sounds checked- equal and bilateral Secured at: 21 cm Tube secured with: Tape Dental Injury: Teeth and Oropharynx as per pre-operative assessment

## 2020-01-06 NOTE — Progress Notes (Signed)
SLP Cancellation Note  Patient Details Name: CALEIGHA ZALE MRN: [id number] DOB: August 09, 1955   Cancelled treatment:       Reason Eval/Treat Not Completed: Patient at procedure or test/unavailable (OR). Will f/u as able.   Osie Bond., M.A. [facility] Pager [phone] Office [phone]  01/06/2020, 10:45 AM

## 2020-01-06 NOTE — Progress Notes (Signed)
Pt seen in PACU postop. No eye opening, pupils 68mm, groans to pain but does not follow commands. W/D BLE and RUE to central pain, no movement RUE. Will get non-contrast head CT now.

## 2020-01-06 NOTE — Brief Op Note (Signed)
  NEUROSURGERY BRIEF OPERATIVE  NOTE   PREOP DX: Subarachnoid Hemorrhage  POSTOP DX: Same  PROCEDURE: Diagnostic cerebral angiogram, stent-supported coil embolization of RICA aneurysm  SURGEON: Dr. Consuella Lose, MD  ANESTHESIA: GETA   EBL: Minimal  SPECIMENS: None  COMPLICATIONS: None  CONDITION: Stable to recovery  FINDINGS (Full report in [facility]): 1. Successful stent-supported coil embolization of large RICA aneurysm. Minimal aneurysm neck residual post-coiling. Right M1 and A1 remain widely patent. 2. 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

## 2020-01-06 NOTE — Progress Notes (Signed)
OT Cancellation Note  Patient Details Name: Erika Stanton MRN: [id number] DOB: 1955-10-11   Cancelled Treatment:    Reason Eval/Treat Not Completed: Patient at procedure or test/ unavailable.  Will reattempt as appropriate.  Nilsa Nutting., OTR/L Acute Rehabilitation Services Pager [phone] Office [phone]   Lucille Passy M 01/06/2020, 10:01 AM

## 2020-01-06 NOTE — Transfer of Care (Addendum)
Immediate Anesthesia Transfer of Care Note  Patient: BRITTIN BELNAP  Procedure(s) Performed: IR WITH ANESTHESIA FOR ANEURYSM (N/A )  Patient Location: PACU  Anesthesia Type:General  Level of Consciousness: drowsy  Airway & Oxygen Therapy: Patient Spontanous Breathing and Patient connected to face mask oxygen  Post-op Assessment: Report given to RN and Post -op Vital signs reviewed and stable  Post vital signs: Reviewed and stable  Last Vitals:  Vitals Value Taken Time  BP 97/49 01/06/20 1547  Temp    Pulse 88 01/06/20 1553  Resp 22 01/06/20 1553  SpO2 99 % 01/06/20 1553  Vitals shown include unvalidated device data.  Last Pain:  Vitals:   01/06/20 0800  TempSrc:   PainSc: Asleep      Patients Stated Pain Goal: 4 (28/41/32 4401)  Complications: No complications documented.

## 2020-01-06 NOTE — Progress Notes (Signed)
  NEUROSURGERY PROGRESS NOTE   No issues overnight. Much more alert this am Improved HA and left sided strength No real concerns this am   EXAM:  BP 109/86   Pulse 76   Temp 98 F (36.7 C) (Oral)   Resp (!) 22   Wt (S) 86.7 kg   SpO2 96%   Awake, alert, oriented  Speech fluent, appropriate  CN grossly intact  5/5 RUE/RLE 2/5 LUE Bicep, tricep 3/5 grip At least 3/5 in LLE diffusely  IMPRESSION/PLAN 64 y.o. female SAH d2 secondary to large ?thrombosed right ICA terminus aneurysm.  Also unruptured, smaller left ICA terminus aneurysm. Left hemiplegia improving. - Plan for treatment today via flow-diverting stent.  - Continue nimotop, keppra - TCDs today

## 2020-01-06 NOTE — Progress Notes (Signed)
CTH reviewed, no new hemorrhage, no HCP. No obvious large territory infarct, ?subtle hypodensity in the right caudate adjacent to hemorrhage although unclear. Will cont to monitor exam, SBP goal < 158mmHg.

## 2020-01-07 ENCOUNTER — Inpatient Hospital Stay (HOSPITAL_COMMUNITY): Payer: BC Managed Care – PPO

## 2020-01-07 ENCOUNTER — Encounter (HOSPITAL_COMMUNITY): Payer: Self-pay | Admitting: Neurosurgery

## 2020-01-07 ENCOUNTER — Other Ambulatory Visit (HOSPITAL_COMMUNITY): Payer: Self-pay

## 2020-01-07 DIAGNOSIS — R569 Unspecified convulsions: Secondary | ICD-10-CM

## 2020-01-07 DIAGNOSIS — I639 Cerebral infarction, unspecified: Secondary | ICD-10-CM

## 2020-01-07 DIAGNOSIS — G9340 Encephalopathy, unspecified: Secondary | ICD-10-CM

## 2020-01-07 LAB — BASIC METABOLIC PANEL
Anion gap: 8 (ref 5–15)
BUN: 6 mg/dL — ABNORMAL LOW (ref 8–23)
CO2: 25 mmol/L (ref 22–32)
Calcium: 8.3 mg/dL — ABNORMAL LOW (ref 8.9–10.3)
Chloride: 107 mmol/L (ref 98–111)
Creatinine, Ser: 0.47 mg/dL (ref 0.44–1.00)
GFR calc Af Amer: >60 mL/min (ref >60)
GFR calc non Af Amer: >60 mL/min (ref >60)
Glucose, Bld: 134 mg/dL — ABNORMAL HIGH (ref 70–99)
Potassium: 3.2 mmol/L — ABNORMAL LOW (ref 3.5–5.1)
Sodium: 140 mmol/L (ref 135–145)

## 2020-01-07 LAB — POCT I-STAT 7, (LYTES, BLD GAS, ICA,H+H)
Acid-Base Excess: 3 mmol/L — ABNORMAL HIGH (ref 0.0–2.0)
Bicarbonate: 26.6 mmol/L (ref 20.0–28.0)
Calcium, Ion: 1.13 mmol/L — ABNORMAL LOW (ref 1.15–1.40)
HCT: 32 % — ABNORMAL LOW (ref 36.0–46.0)
Hemoglobin: 10.9 g/dL — ABNORMAL LOW (ref 12.0–15.0)
O2 Saturation: 97 %
Patient temperature: 98.1
Potassium: 3.1 mmol/L — ABNORMAL LOW (ref 3.5–5.1)
Sodium: 140 mmol/L (ref 135–145)
TCO2: 28 mmol/L (ref 22–32)
pCO2 arterial: 37.3 mmHg (ref 32.0–48.0)
pH, Arterial: 7.46 — ABNORMAL HIGH (ref 7.350–7.450)
pO2, Arterial: 85 mmHg (ref 83.0–108.0)

## 2020-01-07 LAB — CBC
HCT: 32.3 % — ABNORMAL LOW (ref 36.0–46.0)
Hemoglobin: 10.5 g/dL — ABNORMAL LOW (ref 12.0–15.0)
MCH: 31.3 pg (ref 26.0–34.0)
MCHC: 32.5 g/dL (ref 30.0–36.0)
MCV: 96.1 fL (ref 80.0–100.0)
Platelets: 251 10*3/uL (ref 150–400)
RBC: 3.36 MIL/uL — ABNORMAL LOW (ref 3.87–5.11)
RDW: 13 % (ref 11.5–15.5)
WBC: 14 10*3/uL — ABNORMAL HIGH (ref 4.0–10.5)
nRBC: 0 % (ref 0.0–0.2)

## 2020-01-07 LAB — PHOSPHORUS
Phosphorus: 2 mg/dL — ABNORMAL LOW (ref 2.5–4.6)
Phosphorus: 1.4 mg/dL — ABNORMAL LOW (ref 2.5–4.6)

## 2020-01-07 LAB — MAGNESIUM
Magnesium: 2.1 mg/dL (ref 1.7–2.4)
Magnesium: 2.1 mg/dL (ref 1.7–2.4)

## 2020-01-07 LAB — GLUCOSE, CAPILLARY
Glucose-Capillary: 160 mg/dL — ABNORMAL HIGH (ref 70–99)
Glucose-Capillary: 132 mg/dL — ABNORMAL HIGH (ref 70–99)
Glucose-Capillary: 132 mg/dL — ABNORMAL HIGH (ref 70–99)
Glucose-Capillary: 142 mg/dL — ABNORMAL HIGH (ref 70–99)
Glucose-Capillary: 156 mg/dL — ABNORMAL HIGH (ref 70–99)

## 2020-01-07 MED ORDER — VITAL HIGH PROTEIN PO LIQD: 1000.0000 mL | ORAL | Status: DC

## 2020-01-07 MED ORDER — CLOPIDOGREL BISULFATE 75 MG PO TABS
75.0000 mg | ORAL_TABLET | Freq: Every day | ORAL | Status: DC
  Administered 2020-01-07 – 2020-01-20 (×14): 75 mg
  Filled 2020-01-07 (×14): qty 1

## 2020-01-07 MED ORDER — PROSOURCE TF PO LIQD: 45.0000 mL | Freq: Two times a day (BID) | ORAL | Status: DC

## 2020-01-07 MED ORDER — ASPIRIN 325 MG PO TABS
325.0000 mg | ORAL_TABLET | Freq: Every day | ORAL | Status: DC
  Administered 2020-01-07 – 2020-01-20 (×14): 325 mg
  Filled 2020-01-07 (×13): qty 1

## 2020-01-07 MED ORDER — POTASSIUM CHLORIDE 20 MEQ PO PACK
40.0000 meq | PACK | Freq: Once | ORAL | Status: AC
  Administered 2020-01-07: 40 meq
  Filled 2020-01-07: qty 2

## 2020-01-07 MED ORDER — OSMOLITE 1.2 CAL PO LIQD
1000.0000 mL | ORAL | Status: DC
  Administered 2020-01-07 – 2020-01-14 (×7): 1000 mL
  Filled 2020-01-07 (×12): qty 1000

## 2020-01-07 MED ORDER — PROSOURCE TF PO LIQD
45.0000 mL | Freq: Three times a day (TID) | ORAL | Status: DC
  Administered 2020-01-07 – 2020-01-20 (×40): 45 mL
  Filled 2020-01-07 (×40): qty 45

## 2020-01-07 NOTE — Procedures (Addendum)
Patient Name: Erika Stanton  MRN: [id number]  Epilepsy Attending: Lora Havens  Referring Physician/Provider: Dr. Dyann Ruddle Date: 01/07/2020 Duration: 24.78mins  Patient history: 64 y.o. female SAHd#2 Hunt-Hess 4 Fisher 4, POD1 stent-supported coil embolization of large RICA aneurysm.  Continues to be altered.  EEG to evaluate for seizures.  Level of alertness: Comatose  AEDs during EEG study: Keppra  Technical aspects: This EEG study was done with scalp electrodes positioned according to the 10-20 International system of electrode placement. Electrical activity was acquired at a sampling rate of 500Hz  and reviewed with a high frequency filter of 70Hz  and a low frequency filter of 1Hz . EEG data were recorded continuously and digitally stored.   Description: EEG showed continuous rhythmic generalized polymorphic 3 to 6 Hz theta-delta slowing.  Sharp waves were seen in right temporal region.  Hyperventilation and photic stimulation were not performed.     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  No seizures were seen throughout the recording.  Erika Stanton

## 2020-01-07 NOTE — Progress Notes (Addendum)
NEUROLOGY PROGRESS NOTE  Subjective: Patient currently is obtunded.  She currently is not on sedating medications.  Exam: Vitals:   01/07/20 1000 01/07/20 1200  BP: 121/67 122/66  Pulse: (!) 56 64  Resp: 20 20  Temp:  98.1 F (36.7 C)  SpO2: 100% 99%   Neuro:  Mental Status: Patient does not respond to verbal stimuli.  Does respond to noxious stimuli in lower extremities by withdrawing.  Does not follow commands.  No verbalizations noted.  Cranial Nerves: II: patient does not respond confrontation bilaterally,  III,IV,VI: doll's response absent bilaterally. pupils right 2 mm, left 2 mm,and reactive bilaterally V,VII: corneal reflex present bilaterally  VIII: patient does not respond to verbal stimuli IX,X: gag reflex present XI: trapezius strength unable to test bilaterally XII: tongue strength unable to test Motor: Withdraws from noxious stimuli bilateral lower extremities but not upper extremities Sensory: As above. Deep Tendon Reflexes:  2+ throughout upper extremity and knee jerk. Plantars: downgoing bilaterally Cerebellar: Unable to perform    Medications:  Scheduled: . aspirin  325 mg Per Tube Daily  . Chlorhexidine Gluconate Cloth  6 each Topical Daily  . clopidogrel  75 mg Per Tube Daily  . feeding supplement (PROSource TF)  45 mL Per Tube BID  . feeding supplement (VITAL HIGH PROTEIN)  1,000 mL Per Tube Q24H  . niMODipine  60 mg Per Tube Q4H  . pantoprazole (PROTONIX) IV  40 mg Intravenous QHS  . senna-docusate  1 tablet Oral BID   Continuous: . sodium chloride 10 mL/hr at 01/07/20 1200  . clevidipine Stopped (01/07/20 0309)  . levETIRAcetam Stopped (01/07/20 1021)   UXN:ATFTDD chloride, acetaminophen **OR** acetaminophen (TYLENOL) oral liquid 160 mg/5 mL **OR** acetaminophen  Pertinent Labs/Diagnostics:  CT HEAD WO CONTRAST Result Date: 01/06/2020 CLINICAL DATA:  Mental status changes following aneurysm coiling. EXAM: CT HEAD WITHOUT CONTRAST  TECHNIQUE: Contiguous axial images were obtained from the base of the skull through the vertex without intravenous contrast. COMPARISON:  Head CT and MR studies yesterday. FINDINGS: Brain: Brainstem and cerebellum remain normal. Small amount of blood in the fourth ventricle as seen previously. Left hemisphere appears intrinsically normal. Small amount of blood in the left lateral ventricle without hydrocephalus. 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 Surrounding intraparenchymal hemorrhage has not apparently increased. Intraventricular blood on the right appears the same. Vascular: No other vascular finding.  Stent markers are noted. 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 Surrounding intraparenchymal hemorrhage has not apparently increased. Intraventricular blood appears similar, without hydrocephalus. Electronically Signed   By: Nelson Chimes M.D.   On: 01/06/2020 17:42   MR BRAIN WO CONTRAST Result Date: 01/07/2020 CLINICAL DATA:  Follow-up right ICA aneurysm with coiling and hemorrhage. EXAM: MRI HEAD WITHOUT CONTRAST TECHNIQUE: Multiplanar, multiecho pulse sequences of the brain and surrounding structures were obtained without intravenous contrast. COMPARISON:  01/06/2020.  MRI 01/05/2020. FINDINGS: Brain: Diffusion imaging shows development of acute cortical and subcortical infarction in the left anterior inferior frontal lobe and the left parietal vertex region. Few other scattered punctate foci of acute infarction present within the right frontal and parietal cortical brain consistent with micro embolic infarctions. No evidence of acute swelling or hemorrhage associated with those acute infarctions. 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  Surrounding vasogenic edema slightly more prominent than was seen 2  days ago. Layering blood products again evident within the ventricles, not grossly increased in volume. No sign of obstructive hydrocephalus. Vascular: No new vascular finding. Flow present in the major vessels at the base of the brain. Skull and upper cervical spine: Normal Sinuses/Orbits: Clear/normal Other: None IMPRESSION: 1. Development of acute cortical and subcortical infarction in the left anterior inferior frontal lobe and left parietal vertex. Consider spasm as a potential etiology. Few other scattered punctate foci of acute infarction within the right frontal and parietal cortical brain consistent with micro embolic infarctions. No evidence of swelling or hemorrhage associated with those acute infarctions. 2. 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 Surrounding vasogenic edema slightly more prominent than was seen 2 days ago. No evidence of obstructive hydrocephalus. Electronically Signed   By: Nelson Chimes M.D.   On: 01/07/2020 11:08   MR BRAIN W WO CONTRAST Result Date: 01/05/2020 CLINICAL DATA:  Intracranial hemorrhage EXAM: MRI HEAD WITHOUT AND WITH CONTRAST TECHNIQUE: Multiplanar, multiecho pulse sequences of the brain and surrounding structures were obtained without and with intravenous contrast. CONTRAST:  8.75mL GADAVIST GADOBUTROL 1 MMOL/ML IV SOLN COMPARISON:  Correlation made with prior CT FINDINGS: Brain: Area of parenchymal hemorrhage centered in the right basal ganglia and adjacent white matter measuring approximately 4.1 x 3.1 x 2.8 cm. Surrounding edema is present. Together, there is effacement of the adjacent right lateral ventricle. Leftward midline shift remains trace. Intraventricular extension of hemorrhage is present. Subarachnoid extension into the right sylvian fissure is also noted. These findings are  similar to the prior CT. There is no acute infarction. A few patchy foci of T2 hyperintensity in the supratentorial white matter nonspecific but may reflect mild chronic microvascular ischemic changes. There is no abnormal parenchymal enhancement. 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 Additional outpouching adjacent to the left carotid terminus corresponds to aneurysm seen on CTA. Major vessel flow voids at the skull base are preserved. Skull and upper cervical spine: Normal marrow signal is preserved. Sinuses/Orbits: Paranasal sinuses are aerated. Orbits are unremarkable. Other: Sella is unremarkable.  Mastoid air cells are clear. IMPRESSION: Parenchymal hemorrhage centered in the right basal ganglia and adjacent white matter with sulcal subarachnoid/intraventricular extension and mild regional mass effect. Appearance is similar to the prior CT. 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 Electronically Signed   By: Macy Mis M.D.   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 Initial evaluation she was found be hemiplegic on. CT scan demonstrated relatively confined right-sided intraparenchymal hemorrhage primarily in the basal ganglia mild extension into right lateral ventricle. Patient was monitored overnight and MRI was obtained which did not demonstrate any large vessel occlusion. Patient was loaded with aspirin and Plavix. She was noted to be clinically improved with some movement of the left lower extremity greater than left upper extremity. She presents for angiogram and likely endovascular  treatment the large aneurysm on the right side seen CT angiogram. ACCESS: The technical aspects of the procedure as well as its potential risks and benefits were reviewed with the patient and her husband. These risks included but were  not limited to stroke, intracranial hemorrhage, bleeding, infection, allergic reaction, damage to organs or vital structures, stroke, non-diagnostic procedure, and the catastrophic outcomes of heart attack, coma, and death. With an understanding of these risks, informed consent was obtained and witnessed. The patient was placed in the supine position on the angiography table and the skin of right groin prepped in the usual sterile fashion. The procedure was performed under general anesthesia. An 8-French sheath was introduced in the right common femoral artery using Seldinger technique. MEDICATIONS: HEPARIN: 4000 Units total. CONTRAST:  cc, Omnipaque 300 FLUOROSCOPY TIME:  FLUOROSCOPY TIME: See IR records TECHNIQUE: CATHETERS AND WIRES 5-French JB-1 catheter 180 cm 0.035" glidewire 300cm 0.035 " glidewire 6-French NeuronMax guide sheath 5-French MPD guide catheter 6-French Berenstein Select JB-1 catheter 0.058" CatV guidecatheter 150 cm XT-27 microcatheter Synchro select standard microwire Excelsior XT-17 microcatheter x2 Headliner 0.012 double curve microwire COILS/STENTS USED Neuroform Atlas 4.5 x 47mm Target XL 360 standard 14 mm x 50 cm Target XL 360 standard 12 mm x 45 cm Target XL 360 soft 12 mm x 45 cm Target XL 360 soft 12 mm x 45 cm Target XL 360 standard 10 mm x 40 cm Target XL 360 soft 10 mm x 40 cm Target XL 360 soft 10 mm x 40 cm Target XL 360 soft 8 mm x 30 cm Target XL 360 soft 9 mm x 30 cm Target XL 360 soft 9 mm x 30 cm VESSELS CATHETERIZED Right internal carotid Left internal carotid Left vertebral Left middle cerebral Left anterior cerebral artery Right middle cerebral artery Right anterior cerebral artery Right common femoral Left common femoral VESSELS STUDIED  Right internal carotid, head Left internal carotid, head Left vertebral Right internal carotid artery, head (post-stent) Right internal carotid artery, head (during embolization) Right internal carotid artery, head (immediate post-embolization) Right internal carotid artery, head (final control) Left internal carotid artery, head (final control) Right common femoral Left common femoral PROCEDURAL NARRATIVE A 5-Fr JB-1 glide catheter was advanced over a 0.035 glidewire into the aortic arch. The right internal carotid, left internal carotid, and left vertebral artery were then sequentially catheterized and cervical / cerebral angiograms taken. Three-dimensional rotational angiogram was taken with the catheter in the right internal carotid artery. After review of images, the catheter was removed without incident. After review of the images, we elected to proceed with stent supported coiling with stent extending from the right M1 across the neck of the aneurysm into the right M1. This would require by femoral access in order across the anterior communicating artery while maintaining catheter access in the right internal carotid artery for control angiograms. The left common femoral artery was catheterized with the short 5 French sheath using standard Seldinger technique. The JB 1 catheter was then advanced into the right internal carotid artery. The 180 cm Glidewire was removed and exchange length Glidewire was introduced. The glide catheter was then exchanged for the MPD guide catheter. Under roadmap guidance, the XT 17 microcatheter was then introduced over the microwire and advanced into the lumen of the large right carotid terminus aneurysm. Attention was then turned to catheterization of the left internal carotid artery. Using the right-sided femoral access, the neuron max was introduced over the Zearing select catheter and Glidewire. The left internal carotid artery was selected. The neuron max was then  advanced into the proximal cervical left internal carotid artery. The Berenstein select catheter was removed, and Cat 5 guide catheter was introduced over the XT 27 microcatheter and synchro  select microwire. Under roadmap guidance, the microcatheter was advanced into the left middle cerebral artery which allowed advancement of the guide catheter into the distal cavernous/proximal supraclinoid left internal carotid artery. The XT 27 microcatheter was then removed and the XT 17 microcatheter was introduced over the microwire. Attempts were then made to catheterize the left A1 using the synchro select microwire which was unsuccessful. The synchro wire was exchanged for the headliner microwire. This allowed catheterization of the left A1. Again under roadmap guidance, the anterior communicating artery was identified and crossed with the microwire and microcatheter. Roadmap was then taken through the right internal carotid artery in order to advance the microwire and microcatheter into the right A1. At this point, multiple attempts were made to cross the aneurysm neck and select the right middle cerebral artery which were unsuccessful. During these attempts, I did withdrawn the right-sided microcatheter out of the aneurysm lumen in into the distal portion of the right supraclinoid internal carotid artery. Using the synchro select microwire, a loop was made within the aneurysm lumen which allowed selection of the right middle cerebral artery. Microcatheter was then advanced over the lumen into the right middle cerebral artery. The microwire was then further advanced into the mid to distal M3 segment, and the microcatheter was advanced into the M2 M3 junction. This allowed enough distal purchase to withdraw the microwire and reduce the loop within the aneurysm lumen. At this point, the microwire was introduced back into the microcatheter in the right carotid, and the microcatheter was advanced into the aneurysm lumen. The  microwire was then removed from the left-sided microcatheter, and the above Neuroform Atlas stent was introduced. The stent was then deployed successfully extending from the mid right M1 across the neck of the aneurysm and into the distal segment of the right A1. The pusher wire was then withdrawn, and the microcatheter was withdrawn into the contralateral, left A1. Angiogram was taken documenting patency of the middle and anterior cerebral arteries on the right side. Once the stent was deployed successfully, the above coils were sequentially deployed through the right-sided microcatheter with intermittent angiograms taken to document stability of the coil pack and patency of the carotid, anterior cerebral, and middle cerebral arteries. After deployment of the last coil, the distal portion of the coiling catheter was noted to have withdrawn near the aneurysm neck. Once the last coil was deployed, the microcatheter was removed. Final angiogram was taken. The left-sided microcatheter was then removed, and the left-sided guide catheter and guide sheath were withdrawn down into the cervical left internal carotid artery. Final control angiograms were then taken through the guide catheters in the right and left internal carotid arteries. The guide catheter and guide sheath were then synchronously removed without incident. FINDINGS: Right internal carotid, head: Injection reveals the presence of a widely patent ICA, M1, and A1 segments and their branches. There is a large aneurysm at the right carotid terminus which appears to incorporate the origin of the right middle cerebral artery and right anterior cerebral artery. Aneurysm measures approximately 17 x 14 x 19 mm. The aneurysm projects superiorly and somewhat posteriorly behind the ICA terminus. There appears to be a mild post aneurysmal stenosis of the proximal right M1. The parenchymal and venous phases are normal. The venous sinuses are widely patent. Right internal  carotid, 3D rotation 3-dimensional rotational angiographic images were reconstructed on [employer] workstation for review. These further delineate the above described large right internal carotid artery aneurysm. Review of the three-dimensional  images allowed identification of good working projections for deployment of the stent and identification of the neck for coiling. Left internal carotid, head: Injection reveals the presence of a widely patent ICA, A1, and M1 segments and their branches. There is a superiorly projecting aneurysm with relatively narrow neck at the left ICA terminus. This aneurysm measures approximately 5.3 x 4.6 mm. The parenchymal and venous phases are normal. The venous sinuses are widely patent. Left vertebral: Injection reveals the presence of a widely patent vertebral artery. This leads to a widely patent basilar artery that terminates in bilateral P1. The basilar apex is normal. No aneurysms, AVMs, or high-flow fistulas are seen. The parenchymal and venous phases are normal. The venous sinuses are widely patent. Right internal carotid artery, head (post-stent): Angiogram taken from the right internal carotid artery immediately post stent deployment demonstrates widely patent right internal carotid artery, anterior cerebral, and middle cerebral arteries. Stent is seen extending from the distal right A1, across the aneurysm neck, into the midportion of the right M1. No filling defects are seen within the stent. Aneurysm filling is unchanged. Right internal carotid artery, head (during embolization): Injection reveals the presence of a widely patent ICA that leads to a patent ACA and MCA. Angiograms demonstrate progressive occlusion of the aneurysm with significant amount of contrast stasis within the aneurysm lumen. Angiograms also demonstrate widely patent A1 and M1 segments. No filling defects are identified within the stent or near the neck of the aneurysm. Right internal carotid  artery, head (immediate post-embolization): Injection reveals the presence of a widely patent ICA that leads to a patent ACA and MCA. Coil mass within the aneurysm is stable, without coil prolapse or filling defect to suggest thrombus. There is minimal residual aneurysm filling at the neck, just adjacent to the origin of the right M1. Right internal carotid artery, head (final control): Injection reveals the presence of a widely patent ICA that leads to a patent ACA and MCA. No thrombus is visualized. Coil mass is seen within the aneurysm and is in stable position. The parenchymal and venous phases are unremarkable. Left internal carotid artery, head (final control): Injection reveals presence of widely patent left internal carotid artery with patent ACA and MCA. There are no filling defects visualized. There is somewhat sluggish flow through a distal M3/M4 branch in the left frontoparietal region however no significant stasis in this region is seen. Venous sinuses remain widely patent. Right femoral: Normal vessel. No significant atherosclerotic disease. Arterial sheath in adequate position. Left femoral: Normal vessel. No significant atherosclerotic disease. Arterial sheath in adequate position. DISPOSITION: Upon completion of the study, the femoral sheath was removed and hemostasis obtained using a 7-Fr ExoSeal closure device in the right common femoral artery and 5 French closure device in the left common femoral artery. Good proximal and distal lower extremity pulses were documented upon achievement of hemostasis. The procedure was well tolerated and no early complications were observed. The patient was transferred to the postanesthesia care unit in stable hemodynamic condition. IMPRESSION: 1. 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 There is minimal aneurysm neck residual adjacent to the right M1 post coiling. 2. 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 Electronically Signed   By: Consuella Lose   On: 01/06/2020 16:06    DG Abd Portable 1V Result Date: 01/06/2020 CLINICAL DATA:  Check gastric catheter placement EXAM: PORTABLE ABDOMEN -  1 VIEW COMPARISON:  None. FINDINGS: Scattered large and small bowel gas is noted. Gastric catheter is noted within the stomach. No free air is seen. No bony abnormality is noted. IMPRESSION: Gastric catheter within the stomach. Electronically Signed   By: Inez Catalina M.D.   On: 01/06/2020 21:40   EEG adult Result Date: 01/07/2020 Lora Havens, MD     01/07/2020  1:03 PM Patient Name: Erika Stanton MRN: [id number] Epilepsy Attending: Lora Havens Referring Physician/Provider: Dr. Dyann Ruddle Date: 01/07/2020 Duration: 24.86mins Patient history: 64 y.o. female SAHd#2 Hunt-Hess 4 Fisher 4, POD1 stent-supported coil embolization of large RICA aneurysm.  Continues to be altered.  EEG to evaluate for seizures. Level of alertness: Comatose AEDs during EEG study: Keppra Technical aspects: This EEG study was done with scalp electrodes positioned according to the 10-20 International system of electrode placement. Electrical activity was acquired at a sampling rate of 500Hz  and reviewed with a high frequency filter of 70Hz  and a low frequency filter of 1Hz . EEG data were recorded continuously and digitally stored. Description: EEG showed continuous rhythmic generalized polymorphic 3 to 6 Hz theta-delta slowing.  Sharp waves were seen in right temporal region.  Hyperventilation and photic stimulation were not performed.   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  No seizures were seen throughout the recording. Priyanka O  Yadav   VAS [place] TRANSCRANIAL DOPPLER Result Date: 01/07/2020  Transcranial Doppler Indications: Subarachnoid hemorrhage. Performing Technologist: Abram Sander RVS  Examination Guidelines: A complete evaluation includes B-mode imaging, spectral Doppler, color Doppler, and power Doppler as needed of all accessible portions of each vessel. Bilateral testing is considered an integral part of a complete examination. Limited examinations for reoccurring indications may be performed as noted.  +----------+-------------+----------+-----------+-------+ RIGHT TCD Right VM (cm)Depth (cm)PulsatilityComment +----------+-------------+----------+-----------+-------+ MCA           47.00                 1.24            +----------+-------------+----------+-----------+-------+ ACA          -34.00                 1.02            +----------+-------------+----------+-----------+-------+ Term ICA      27.00                 1.41            +----------+-------------+----------+-----------+-------+ Opthalmic     20.00                 1.59            +----------+-------------+----------+-----------+-------+ ICA siphon    42.00                 1.61            +----------+-------------+----------+-----------+-------+  +----------+------------+----------+-----------+-------+ LEFT TCD  Left VM (cm)Depth (cm)PulsatilityComment +----------+------------+----------+-----------+-------+ MCA          52.00                 1.28            +----------+------------+----------+-----------+-------+ PCA          19.00                 0.99            +----------+------------+----------+-----------+-------+  Opthalmic    20.00                 1.07            +----------+------------+----------+-----------+-------+ ICA siphon   28.00                 1.47            +----------+------------+----------+-----------+-------+     Preliminary      [name] PA-C [facility] [phone]  Assessment: 64 year old female who presented to the hospital on 8/24 with right-sided headache and left-sided flaccidity. Imaging revealed a large right carotid terminus ruptured aneurysm with surrounding hematoma. She is now status post coil embolization of the aneurysm with stable intraparenchymal hematoma. She has not recovered function to the expected level at this time point following her aneurysm coiling.  1. 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 The total volume of the 2 acute strokes is relatively large. The new strokes in conjunction with the right sided hematoma are felt to be the most likely explanation for her obtundation.  2. EEG showed evidence of epileptogenicity arising from right temporal region as well as severe diffuse encephalopathy, nonspecific to etiology., The epileptogenicity is felt most likely to be secondary to the acute right hemisphere hemorrhages, most significantly the subarachnoid hemorrhage. The diffuse encephalopathy is felt to be due to combined effects of the right sided hemorrhage, bilateral ventricular blood and left sided ischemic strokes. 3. The etiology for the acute left hemispheric infarctions is felt most likely to be vasospasm.  4. On exam today, the patient is obtunded and on minimal to no sedating medications.   5. She continues to be on Keppra with no clinical seizures.  Recommendations: - LTM EEG:  - Continue Keppra 500 mg twice daily -- Continue Nimodipine  Electronically signed: Dr. Kerney Elbe 01/07/2020, 2:19 PM

## 2020-01-07 NOTE — Progress Notes (Signed)
Transcranial Doppler  Date POD PCO2 HCT BP  MCA ACA PCA OPHT SIPH VERT Basilar  8/26 MR     Right  Left   47  52   -34  *   *  19   20  20    42  28   *  *   *           Right  Left                                            Right  Left                                             Right  Left                                             Right  Left                                            Right  Left                                            Right  Left                                        MCA = Middle Cerebral Artery      OPHT = Opthalmic Artery     BASILAR = Basilar Artery   ACA = Anterior Cerebral Artery     SIPH = Carotid Siphon PCA = Posterior Cerebral Artery   VERT = Verterbral Artery                   Normal MCA = 62+\-12 ACA = 50+\-12 PCA = 42+\-23    Erika Stanton 01/07/2020 1:52 PM

## 2020-01-07 NOTE — Progress Notes (Signed)
OT Cancellation Note  Patient Details Name: Erika Stanton MRN: [id number] DOB: January 17, 1956   Cancelled Treatment:    Reason Eval/Treat Not Completed: Medical issues which prohibited therapy RN reports stat MRI due to decline in medical status and not following any commands.  Will follow and see as able/medically appropriate.   Jolaine Artist, OT Acute Rehabilitation Services Pager [phone] Office [phone]    [name] 01/07/2020, 9:53 AM

## 2020-01-07 NOTE — Progress Notes (Signed)
EEG completed, results pending. 

## 2020-01-07 NOTE — Progress Notes (Signed)
Initial Nutrition Assessment  DOCUMENTATION CODES:   Not applicable  INTERVENTION:   Initiate tube feeding via NG tube (gastric): Osmolite 1.2 at 60 ml/h (1440 ml per day) Prosource TF 45 ml TID  Provides 1888 kcal, 113 gm protein, 1167 ml free water daily   NUTRITION DIAGNOSIS:   Inadequate oral intake related to inability to eat as evidenced by NPO status.  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 Pt is s/p angiogram and tent-coil embolization of R ICA aneurysm.   Pt discussed during ICU rounds and with RN.  Per RN pt obtunded, now has NG for medications. Plan to start nutrition today.   Cleviprex currently off.  Stat MRI today shows development of acute cortical and subcortical infarction in the L anterior inferior frontal lobe and L parietal vertex.   Medications reviewed and include: senokot-s Labs reviewed: K+ 3.2 (L), PO4: 2.0 (L)   Diet Order:   Diet Order    None      EDUCATION NEEDS:   No education needs have been identified at this time  Skin:  Skin Assessment: Reviewed RN Assessment  Last BM:  unknown  Height:   Ht Readings from Last 1 Encounters:  01/07/20 5\' 6"  (1.676 m)    Weight:   Wt Readings from Last 1 Encounters:  01/05/20 (S) 86.7 kg    Ideal Body Weight:  59 kg  BMI:  Body mass index is 30.85 kg/m.  Estimated Nutritional Needs:   Kcal:  1800-2000  Protein:  100-115 grams  Fluid:  > 1.8 L/day  Lockie Pares., RD, LDN, CNSC See AMiON for contact information

## 2020-01-07 NOTE — Progress Notes (Signed)
  NEUROSURGERY PROGRESS NOTE   No issues overnight. No changes overnight  EXAM:  BP 120/61   Pulse 64   Temp 98.1 F (36.7 C) (Axillary)   Resp 18   Wt (S) 86.7 kg   SpO2 100%   Drowsy Does not open eyes PERRL Eyes deviate at times Moves BLE spontaneously Access site: c/d/i  IMPRESSION/PLAN 64 y.o. female SAHd#2 Hunt-Hess 4 Fisher 4, POD1 stent-supported coil embolization of large RICA aneurysm. Altered as above, although stable compared to last night. - AMS: EEG, MRI - continue nimotop, keppra, TCDs

## 2020-01-07 NOTE — Progress Notes (Signed)
SLP Cancellation Note  Patient Details Name: Erika Stanton MRN: [id number] DOB: 1955-06-17   Cancelled treatment:       Reason Eval/Treat Not Completed: Medical issues which prohibited therapy. Pt less responsive this morning. PO trials and cognitive-linguistic evaluation held. Note that diet orders had been placed post-operatively; RN aware and changing to NPO (NGT already present to administer meds overnight). Will f/u as able.   Osie Bond., M.A. [facility] Services Pager [phone] Office [phone]  01/07/2020, 7:54 AM

## 2020-01-07 NOTE — Progress Notes (Signed)
vLTM EEG running. Notified Neuro

## 2020-01-07 NOTE — Progress Notes (Signed)
  NEUROSURGERY PROGRESS NOTE   No issues overnight, with no change in exam. Pt seen and examined this am.  EXAM:  BP 122/66 (BP Location: Left Arm)   Pulse 64   Temp 98.1 F (36.7 C) (Axillary)   Resp 20   Ht 5\' 6"  (1.676 m)   Wt (S) 86.7 kg   SpO2 99%   BMI 30.85 kg/m   Off sedative medications, Eyes open to noxious stimuli Pupils 50mm briskly reactive Grimaces, groans to pain but not FC or responding verbally W/D RUE and BLE, no movement LUE  IMAGING: MRI reviewed with significant susceptibility artifact related to large coil mass. Largely unchanged right peri-aneurysmal/BG hematoma. No HCP. There is gyriform diffusion restriction in the left frontotemporal region c/w ischemia. No large vessel territory stroke  Spot EEG reveal epileptiform discharges from right temporal region, no SZ.  IMPRESSION:  64 y.o. female SAH d#2 POD#1 stent-coil of large RICA terminus aneurysm. Pt remains obtunded postop with epileptiform discharges on EEG. Left frontotemporal ischemia is likely post-procedural and responsible for right arm weakness but does not explain her level of obtundation.  PLAN: - Will consult neurology and get long-term video EEG - Cont Keppra - Cont Nimotop - TCD today and MWF - Cont ASA/Plavix for intracranial stent  I have reviewed the situation with the patient's husband at bedside who seemed very shocked at the critical nature of the patient's illness given her relatively good condition preop. I explained to him that while she was in fair neurologic condition yesterday, there are secondary complications which can result from subarachnoid hemorrhage and angiographic procedures (which we reviewed yesterday preop). The major complications of SAH are delayed neurologic deficit related to stroke, SZ, and hydrocephalus. There are also general potential complications related to ICU stay primarily including infections. We reviewed the plan above. All questions were answered.

## 2020-01-07 NOTE — Progress Notes (Signed)
PT Cancellation Note  Patient Details Name: Erika Stanton MRN: [id number] DOB: June 27, 1955   Cancelled Treatment:    Reason Eval/Treat Not Completed: Medical issues which prohibited therapy. Per discussion with RN pt is minimally responsive and is now undergoing workup for seizure or possible new neurological event. PT will hold until pt is more medically stable.   Zenaida Niece 01/07/2020, 10:56 AM

## 2020-01-07 NOTE — Progress Notes (Signed)
NAME:  Erika Stanton, MRN:  [id number], DOB:  1955/11/27, LOS: 2 ADMISSION DATE:  01/05/2020, CONSULTATION DATE: 01/07/20  REFERRING MD:  Kerney Elbe, MD, CHIEF COMPLAINT:  Right sided headache, weakness   Brief History   Erika Stanton is a 64 y.o. 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 Since presentation, she has underwent placement of a flow diverting stent with IR.   History of present illness   Erika Stanton is a 63 y.o. female with no past medical history. Patient had walked outside then returned to the house complaining of a headache at 9/10 in the morning.  She later notified her husband of left arm and left leg weakness.  EMS was called. On arrival to the ED she was drowsy but complained of right-sided headache and left-sided weakness. She reported her headache was located along her right forehead. A STAT head CT was obtained which showed both intraventricular and intraparenchymal hemorrhage. 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   Past Medical History  None  Significant Hospital Events   8/24 - Admitted  Consults:  Neurosurgery Neurology Interventional Radiology  Procedures:  8/25 - Diagnostic cerebral angiogram, stent-supported coil embolization of RICA aneurysm  Significant Diagnostic Tests:  8/24 - CT Head Code Stroke Wo Contrast: Intra-axial hemorrhage at the right basal ganglia with an estimated volume of 18 mL, plus a small volume of subarachnoid hemorrhage and bilateral intraventricular blood.  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  Micro Data:  None  Antimicrobials:  None  Interim history/subjective:  No acute events overnight. Patient is resting in bed this AM. Her NG tube is in place. She remains minimally responsive to noxious stimuli. She seems to be more unresponsive than last night with less purposeful and spontaneous movements. She was noted to have possible seizure like movements overnight. She did not exhibit any sustained eye opening this AM.  Objective   Blood pressure (!) 135/94, pulse 72, temperature 98.4 F (36.9 C), temperature source Axillary, resp. rate (!) 22, weight (S) 86.7 kg, SpO2 98 %.        Intake/Output Summary (Last 24 hours) at 01/07/2020 0900 Last data filed at 01/07/2020 0800 Gross per 24 hour  Intake 1755.47 ml  Output 6220 ml  Net -4464.53 ml   Filed Weights   01/05/20 1000 01/05/20 1129  Weight: 86.7 kg (S) 86.7 kg    Examination: General: Resting, NG tube in place, well-nourished, no acute distress HENT: Normocephalic, atraumatic, mucus membranes moist Lungs: CTAB, no increased work of breathing Cardiovascular: RRR, Normal S1, S2 Abdomen: NABS, nontender, nondistended, no HSM Extremities: Pulses 2+ and symmetric bilaterally Neuro: Somnolent, not following commands  Resolved Hospital Problem list   N/A  Assessment & Plan:   1. 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 Acute Intracranial Hemorrhage at R Basal Ganglia -Patient is now s/p angiogram and tent-coil embolization of RICA aneurysm  -Clevidipine for BP control, SBP <140 -Continue frequent neuro checks  -Daily chemistries  -Continuous  telemetry  -Speech path reevaluation pending, pt currently NPO  -PT/OT eval  -Serial TCDs -Keppra 500 mg IVG -Nimodipine 60 mg PO Q4H -UDS pending  -EEG pending  -ABG this AM -Stat MRI  Best practice:  Diet: NPO pending clearance by Speech  Pathology Pain/Anxiety/Delirium protocol (if indicated): per protocol VAP protocol (if indicated): per protocol DVT prophylaxis: pneumatic compression devices/SCDs GI prophylaxis: Protonix Glucose control: N/A Mobility: Bed rest  Code Status: FULL Family Communication: N/A Disposition: ICU  Labs   CBC: Recent Labs  Lab 01/05/20 0956 01/05/20 1002 01/07/20 0838  WBC 9.0  --   --   NEUTROABS 4.3  --   --   HGB 12.2 12.6 10.9*  HCT 39.0 37.0 32.0*  MCV 96.1  --   --   PLT 329  --   --     Basic Metabolic Panel: Recent Labs  Lab 01/05/20 0956 01/05/20 1002 01/07/20 0838  NA 139 139 140  K 3.4* 3.3* 3.1*  CL 102 104  --   CO2 25  --   --   GLUCOSE 168* 166*  --   BUN 10 12  --   CREATININE 0.65 0.60  --   CALCIUM 9.0  --   --    GFR: CrCl cannot be calculated (Unknown ideal weight.). Recent Labs  Lab 01/05/20 0956  WBC 9.0    Liver Function Tests: Recent Labs  Lab 01/05/20 0956  AST 26  ALT 20  ALKPHOS 74  BILITOT 0.8  PROT 6.4*  ALBUMIN 3.9   No results for input(s): LIPASE, AMYLASE in the last 168 hours. No results for input(s): AMMONIA in the last 168 hours.  ABG    Component Value Date/Time   PHART 7.460 (H) 01/07/2020 0838   PCO2ART 37.3 01/07/2020 0838   PO2ART 85 01/07/2020 0838   HCO3 26.6 01/07/2020 0838   TCO2 28 01/07/2020 0838   O2SAT 97.0 01/07/2020 0838     Coagulation Profile: Recent Labs  Lab 01/05/20 0956 01/05/20 1211  INR 1.0 1.0    Cardiac Enzymes: No results for input(s): CKTOTAL, CKMB, CKMBINDEX, TROPONINI in the last 168 hours.  HbA1C: Hgb A1c MFr Bld  Date/Time Value Ref Range Status  01/05/2020 12:11 PM 6.0 (H) 4.8 - 5.6 % Final    Comment:    (NOTE)         Prediabetes: 5.7 - 6.4         Diabetes: >6.4         Glycemic control for adults with diabetes: <7.0     CBG: Recent Labs  Lab 01/06/20 0846 01/06/20 1950 01/06/20 2327 01/07/20 0337 01/07/20 0812  GLUCAP 111* 131* 157* 160* 132*     Review of Systems:   Review of Systems  Constitutional: Negative.   HENT: Negative.   Respiratory: Negative.   Cardiovascular: Negative.   Gastrointestinal: Negative for nausea and vomiting.  Genitourinary: Negative.   Musculoskeletal: Negative.   Skin: Negative.   Neurological: Positive for dizziness, focal weakness, weakness and headaches. Negative for tremors, sensory change, seizures and loss of consciousness.   Past Medical History  She  has no past medical history on file.   Surgical History    Past Surgical History:  Procedure Laterality Date  . IR 3D INDEPENDENT WKST  01/06/2020  . IR ANGIO INTRA EXTRACRAN SEL INTERNAL CAROTID BILAT MOD SED  01/06/2020  . IR ANGIO VERTEBRAL SEL VERTEBRAL UNI L MOD SED  01/06/2020  . IR ANGIOGRAM FOLLOW UP STUDY  01/06/2020  . IR ANGIOGRAM FOLLOW UP STUDY  01/06/2020  . IR ANGIOGRAM FOLLOW UP STUDY  01/06/2020  . IR ANGIOGRAM FOLLOW UP STUDY  01/06/2020  . IR ANGIOGRAM FOLLOW UP STUDY  01/06/2020  . IR ANGIOGRAM FOLLOW UP STUDY  01/06/2020  . IR ANGIOGRAM FOLLOW UP STUDY  01/06/2020  . IR ANGIOGRAM FOLLOW UP STUDY  01/06/2020  . IR ANGIOGRAM FOLLOW UP STUDY  01/06/2020  . IR ANGIOGRAM FOLLOW UP STUDY  01/06/2020  . IR NEURO EACH ADD'L AFTER BASIC UNI RIGHT (MS)  01/06/2020  . IR TRANSCATH/EMBOLIZ  01/06/2020  . RADIOLOGY WITH ANESTHESIA N/A 01/06/2020   Procedure: IR WITH ANESTHESIA FOR ANEURYSM;  Surgeon: Consuella Lose, MD;  Location: [facility] [facility];  Service: Radiology;  Laterality: N/A;     Social History   She has no history on file for tobacco use, alcohol use, and drug use.  Family History   Her family history is not on file.   Allergies Allergies  Allergen Reactions  . Sulfa Antibiotics Rash     Home Medications  Prior to Admission medications   Medication Sig Start Date End Date Taking? Authorizing Provider  sucralfate (CARAFATE) 1 g tablet Take 1 g by mouth 4 (four) times daily -  with meals and at bedtime.   Yes [provider]     Critical care time:

## 2020-01-07 NOTE — Progress Notes (Signed)
Night shift neurosurgery notified about patient's lethargy and inability to take oral Nimotop. New order to place NG tube for medications. Will place it at bedside and obtain an abdominal x-ray for confirmation.  2200- NG tube placed, patient started moving more and localizing pain during procedure. Will continue to monitor.  0700- throughout the night, patient started doing more purposeful spontaneous movement. Patient opening eyes to pain but not sustaining eye contact. 0700 Neuro exam completed with this RN and day shift nurse.

## 2020-01-08 ENCOUNTER — Inpatient Hospital Stay (HOSPITAL_COMMUNITY): Payer: BC Managed Care – PPO

## 2020-01-08 DIAGNOSIS — R569 Unspecified convulsions: Secondary | ICD-10-CM

## 2020-01-08 LAB — BASIC METABOLIC PANEL
Anion gap: 8 (ref 5–15)
BUN: 11 mg/dL (ref 8–23)
CO2: 24 mmol/L (ref 22–32)
Calcium: 8.6 mg/dL — ABNORMAL LOW (ref 8.9–10.3)
Chloride: 108 mmol/L (ref 98–111)
Creatinine, Ser: 0.45 mg/dL (ref 0.44–1.00)
GFR calc Af Amer: >60 mL/min (ref >60)
GFR calc non Af Amer: >60 mL/min (ref >60)
Glucose, Bld: 144 mg/dL — ABNORMAL HIGH (ref 70–99)
Potassium: 3.6 mmol/L (ref 3.5–5.1)
Sodium: 140 mmol/L (ref 135–145)

## 2020-01-08 LAB — CBC WITH DIFFERENTIAL/PLATELET
Abs Immature Granulocytes: 0.1 10*3/uL — ABNORMAL HIGH (ref 0.00–0.07)
Basophils Absolute: 0 10*3/uL (ref 0.0–0.1)
Basophils Relative: 0 %
Eosinophils Absolute: 0 10*3/uL (ref 0.0–0.5)
Eosinophils Relative: 0 %
HCT: 35.6 % — ABNORMAL LOW (ref 36.0–46.0)
Hemoglobin: 11.3 g/dL — ABNORMAL LOW (ref 12.0–15.0)
Immature Granulocytes: 1 %
Lymphocytes Relative: 17 %
Lymphs Abs: 2.3 10*3/uL (ref 0.7–4.0)
MCH: 30.9 pg (ref 26.0–34.0)
MCHC: 31.7 g/dL (ref 30.0–36.0)
MCV: 97.3 fL (ref 80.0–100.0)
Monocytes Absolute: 0.8 10*3/uL (ref 0.1–1.0)
Monocytes Relative: 6 %
Neutro Abs: 10.8 10*3/uL — ABNORMAL HIGH (ref 1.7–7.7)
Neutrophils Relative %: 76 %
Platelets: 267 10*3/uL (ref 150–400)
RBC: 3.66 MIL/uL — ABNORMAL LOW (ref 3.87–5.11)
RDW: 13.2 % (ref 11.5–15.5)
WBC: 14.1 10*3/uL — ABNORMAL HIGH (ref 4.0–10.5)
nRBC: 0 % (ref 0.0–0.2)

## 2020-01-08 LAB — GLUCOSE, CAPILLARY
Glucose-Capillary: 203 mg/dL — ABNORMAL HIGH (ref 70–99)
Glucose-Capillary: 180 mg/dL — ABNORMAL HIGH (ref 70–99)
Glucose-Capillary: 160 mg/dL — ABNORMAL HIGH (ref 70–99)
Glucose-Capillary: 132 mg/dL — ABNORMAL HIGH (ref 70–99)
Glucose-Capillary: 131 mg/dL — ABNORMAL HIGH (ref 70–99)
Glucose-Capillary: 140 mg/dL — ABNORMAL HIGH (ref 70–99)

## 2020-01-08 LAB — PHOSPHORUS: Phosphorus: 1.2 mg/dL — ABNORMAL LOW (ref 2.5–4.6)

## 2020-01-08 LAB — MAGNESIUM: Magnesium: 2.1 mg/dL (ref 1.7–2.4)

## 2020-01-08 MED ORDER — PANTOPRAZOLE SODIUM 40 MG PO PACK
40.0000 mg | PACK | Freq: Every day | ORAL | Status: DC
  Administered 2020-01-08 – 2020-01-19 (×12): 40 mg
  Filled 2020-01-08 (×12): qty 20

## 2020-01-08 MED ORDER — SODIUM CHLORIDE 0.9 % IV SOLN: INTRAVENOUS | Status: DC | PRN

## 2020-01-08 MED ORDER — INSULIN ASPART 100 UNIT/ML ~~LOC~~ SOLN
0.0000 [IU] | SUBCUTANEOUS | Status: DC
  Administered 2020-01-08: 3 [IU] via SUBCUTANEOUS
  Administered 2020-01-08 – 2020-01-10 (×9): 2 [IU] via SUBCUTANEOUS
  Administered 2020-01-10: 3 [IU] via SUBCUTANEOUS
  Administered 2020-01-10 – 2020-01-11 (×4): 2 [IU] via SUBCUTANEOUS
  Administered 2020-01-11: 3 [IU] via SUBCUTANEOUS
  Administered 2020-01-11 (×3): 2 [IU] via SUBCUTANEOUS
  Administered 2020-01-12: 3 [IU] via SUBCUTANEOUS
  Administered 2020-01-12 (×2): 2 [IU] via SUBCUTANEOUS
  Administered 2020-01-12 (×2): 3 [IU] via SUBCUTANEOUS
  Administered 2020-01-13: 2 [IU] via SUBCUTANEOUS
  Administered 2020-01-13: 3 [IU] via SUBCUTANEOUS
  Administered 2020-01-13 (×2): 2 [IU] via SUBCUTANEOUS
  Administered 2020-01-13: 3 [IU] via SUBCUTANEOUS
  Administered 2020-01-13 – 2020-01-16 (×12): 2 [IU] via SUBCUTANEOUS
  Administered 2020-01-16: 3 [IU] via SUBCUTANEOUS
  Administered 2020-01-16 – 2020-01-17 (×3): 2 [IU] via SUBCUTANEOUS
  Administered 2020-01-17: 3 [IU] via SUBCUTANEOUS
  Administered 2020-01-17 (×2): 2 [IU] via SUBCUTANEOUS
  Administered 2020-01-18: 3 [IU] via SUBCUTANEOUS
  Administered 2020-01-18 – 2020-01-19 (×6): 2 [IU] via SUBCUTANEOUS
  Administered 2020-01-19: 3 [IU] via SUBCUTANEOUS
  Administered 2020-01-19 – 2020-01-20 (×3): 2 [IU] via SUBCUTANEOUS
  Administered 2020-01-20: 3 [IU] via SUBCUTANEOUS
  Administered 2020-01-20 (×2): 2 [IU] via SUBCUTANEOUS

## 2020-01-08 MED ORDER — POTASSIUM PHOSPHATES 15 MMOLE/5ML IV SOLN
30.0000 mmol | Freq: Once | INTRAVENOUS | Status: AC
  Administered 2020-01-08: 30 mmol via INTRAVENOUS
  Filled 2020-01-08: qty 10

## 2020-01-08 NOTE — Progress Notes (Signed)
[facility] Progress Note Patient Name: BURGUNDY MATUSZAK DOB: 23-May-1955 MRN: [id number]   Date of Service  01/08/2020  HPI/Events of Note  Bedside RN requesting cooling blanket for temperature management.  eICU Interventions  Cooling blanket ordered.        Kerry Kass Leopold Smyers 01/08/2020, 8:24 PM

## 2020-01-08 NOTE — Progress Notes (Signed)
NAME:  Erika Stanton, MRN:  [id number], DOB:  Jun 05, 1955, LOS: 3 ADMISSION DATE:  01/05/2020, CONSULTATION DATE: 01/08/20  REFERRING MD:  Kerney Elbe, MD, CHIEF COMPLAINT:  Right sided headache, weakness   Brief History   Erika Stanton is a 64 y.o. 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 Since presentation, she has underwent placement of a flow diverting stent with IR.   History of present illness   Erika Stanton is a 64 y.o. female with no past medical history. Patient had walked outside then returned to the house complaining of a headache at 9/10 in the morning.  She later notified her husband of left arm and left leg weakness.  EMS was called. On arrival to the ED she was drowsy but complained of right-sided headache and left-sided weakness. She reported her headache was located along her right forehead. A STAT head CT was obtained which showed both intraventricular and intraparenchymal hemorrhage. 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   Past Medical History  None  Significant Hospital Events   8/24 - Admitted  Consults:  Neurosurgery Neurology Interventional Radiology  Procedures:  8/25 - Diagnostic cerebral angiogram, stent-supported coil embolization of RICA aneurysm  Significant Diagnostic Tests:  8/24 - CT Head Code Stroke Wo Contrast: Intra-axial hemorrhage at the right basal ganglia with an estimated volume of 18 mL, plus a small volume of subarachnoid hemorrhage and bilateral intraventricular blood.  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  Micro Data:  None  Antimicrobials:  None  Interim history/subjective:  No acute events overnight.  Objective   Blood pressure 123/67, pulse 61, temperature 99.2 F (37.3 C), temperature source Axillary, resp. rate 18, height 5\' 6"  (1.676 m), weight 86.7 kg, SpO2 100 %.        Intake/Output Summary (Last 24 hours) at 01/08/2020 1620 Last data filed at 01/08/2020 1400 Gross per 24 hour  Intake 1363.43 ml  Output 3675 ml  Net -2311.57 ml   Filed Weights   01/05/20 1000 01/05/20 1129 01/08/20 0500  Weight: 86.7 kg (S) 86.7 kg 86.7 kg    Examination: General: Resting, NG tube in place, well-nourished, no acute distress HENT: Normocephalic, atraumatic, mucus membranes moist Lungs: CTAB, no increased work of breathing Cardiovascular: RRR, Normal S1, S2 Abdomen: NABS, nontender, nondistended, no HSM Extremities: Pulses 2+ and symmetric bilaterally Neuro: Somnolent, not following commands  Resolved Hospital Problem list   N/A  Assessment & Plan:   Critically ill due to ruptured Giant Aneurysm (20 mm) causing Acute Intracranial Hemorrhage at R Basal Ganglia Status post coiling Periprocedural ischemic stroke. 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  Plan:  Allow blood pressure to rise to 299 systolic. Follow serial exam.  Entering vasospasm window, at high risk for secondary ischemic neurological injury Follow serial TCD's.  Augment if velocities increase or showing signs of clinical vasospasm.   Daily Goals Checklist  Pain/Anxiety/Delirium protocol (if indicated): No sedation Neuro vitals: every 2 hours AED's: On Keppra VAP protocol (  if indicated): Not intubated Respiratory support goals: Encourage deep breathing. Blood pressure target: Systolic [phone] DVT prophylaxis: SCDs.  Start chemical full prophylaxis in 72 hours Nutrition Status: Tube  feeds at goal GI prophylaxis: Not indicated Fluid status goals: Maintain euvolemia.  Normal saline at 50 an hour Urinary catheter: Assessment of intravascular volume at risk for cerebral salt wasting. Central lines: Peripheral IVs only Glucose control: Euglycemic sliding scale insulin. Mobility/therapy needs: PT OT following. Antibiotic de-escalation: No antibiotics Home medication reconciliation: On hold Daily labs: CBC, BMP daily Code Status: Full code Family Communication: Husband updated by neurosurgery. Disposition: ICU.   Labs   CBC: Recent Labs  Lab 01/05/20 0956 01/05/20 1002 01/07/20 0838 01/07/20 1015 01/08/20 0509  WBC 9.0  --   --  14.0* 14.1*  NEUTROABS 4.3  --   --   --  10.8*  HGB 12.2 12.6 10.9* 10.5* 11.3*  HCT 39.0 37.0 32.0* 32.3* 35.6*  MCV 96.1  --   --  96.1 97.3  PLT 329  --   --  251 841    Basic Metabolic Panel: Recent Labs  Lab 01/05/20 0956 01/05/20 1002 01/07/20 0838 01/07/20 1015 01/07/20 1322 01/07/20 1700 01/08/20 0509  NA 139 139 140 140  --   --  140  K 3.4* 3.3* 3.1* 3.2*  --   --  3.6  CL 102 104  --  107  --   --  108  CO2 25  --   --  25  --   --  24  GLUCOSE 168* 166*  --  134*  --   --  144*  BUN 10 12  --  6*  --   --  11  CREATININE 0.65 0.60  --  0.47  --   --  0.45  CALCIUM 9.0  --   --  8.3*  --   --  8.6*  MG  --   --   --   --  2.1 2.1 2.1  PHOS  --   --   --   --  2.0* 1.4* 1.2*   GFR: Estimated Creatinine Clearance: 79.9 mL/min (by C-G formula based on SCr of 0.45 mg/dL). Recent Labs  Lab 01/05/20 0956 01/07/20 1015 01/08/20 0509  WBC 9.0 14.0* 14.1*    Liver Function Tests: Recent Labs  Lab 01/05/20 0956  AST 26  ALT 20  ALKPHOS 74  BILITOT 0.8  PROT 6.4*  ALBUMIN 3.9   No results for input(s): LIPASE, AMYLASE in the last 168 hours. No results for input(s): AMMONIA in the last 168 hours.  ABG    Component Value Date/Time   PHART 7.460 (H) 01/07/2020 0838   PCO2ART 37.3 01/07/2020 0838    PO2ART 85 01/07/2020 0838   HCO3 26.6 01/07/2020 0838   TCO2 28 01/07/2020 0838   O2SAT 97.0 01/07/2020 0838     Coagulation Profile: Recent Labs  Lab 01/05/20 0956 01/05/20 1211  INR 1.0 1.0    Cardiac Enzymes: No results for input(s): CKTOTAL, CKMB, CKMBINDEX, TROPONINI in the last 168 hours.  HbA1C: Hgb A1c MFr Bld  Date/Time Value Ref Range Status  01/05/2020 12:11 PM 6.0 (H) 4.8 - 5.6 % Final    Comment:    (NOTE)         Prediabetes: 5.7 - 6.4         Diabetes: >6.4         Glycemic control for adults with diabetes: <7.0  CBG: Recent Labs  Lab 01/07/20 1923 01/07/20 2318 01/08/20 0325 01/08/20 0813 01/08/20 1150  GLUCAP 142* 156* 203* 180* 160*   CRITICAL CARE Performed by: Kipp Brood   Total critical care time: 35 minutes  Critical care time was exclusive of separately billable procedures and treating other patients.  Critical care was necessary to treat or prevent imminent or life-threatening deterioration.  Critical care was time spent personally by me on the following activities: development of treatment plan with patient and/or surrogate as well as nursing, discussions with consultants, evaluation of patient's response to treatment, examination of patient, obtaining history from patient or surrogate, ordering and performing treatments and interventions, ordering and review of laboratory studies, ordering and review of radiographic studies, pulse oximetry, re-evaluation of patient's condition and participation in multidisciplinary rounds.  Kipp Brood, MD [facility] ICU Physician [place]  Pager: [phone] Mobile: [phone] After hours: [phone].

## 2020-01-08 NOTE — Progress Notes (Signed)
SLP Cancellation Note  Patient Details Name: Erika Stanton MRN: [id number] DOB: [birth year]   Cancelled treatment:       Reason Eval/Treat Not Completed: Medical issues which prohibited therapy. Per RN, pt remains obtunded.  SLP will follow along.  Mckinnon Glick L. Tivis Ringer, [place] Office number [phone] Pager [phone]    [name] 01/08/2020, 12:48 PM

## 2020-01-08 NOTE — Progress Notes (Signed)
Transcranial Doppler  Date POD PCO2 HCT BP  MCA ACA PCA OPHT SIPH VERT Basilar  8/26 MR     Right  Left   47  52   -34  *   *  19   20  20    42  28   *  *   *      8/27 MS     Right  Left   25  *   *  *   *  *   20  19   33  27   *  *   *           Right  Left                                             Right  Left                                             Right  Left                                            Right  Left                                            Right  Left                                        MCA = Middle Cerebral Artery      OPHT = Opthalmic Artery     BASILAR = Basilar Artery   ACA = Anterior Cerebral Artery     SIPH = Carotid Siphon PCA = Posterior Cerebral Artery   VERT = Verterbral Artery                   Normal MCA = 62+\-12 ACA = 50+\-12 PCA = 42+\-23   01/08/20- study was very technically limited due to excessive patient movement. 01/08/2020 1:18 PM Maudry Mayhew, MHA, RVT, RDCS, RDMS

## 2020-01-08 NOTE — Progress Notes (Signed)
vLTM EEG complete. No skin breakdown 

## 2020-01-08 NOTE — Progress Notes (Signed)
OT Cancellation Note  Patient Details Name: Erika Stanton MRN: [id number] DOB: 03-07-1956   Cancelled Treatment:    Reason Eval/Treat Not Completed: Active bedrest order. Will reattempt.  Erika Stanton., OTR/L Acute Rehabilitation Services Pager [phone] Office [phone]   Erika Stanton 01/08/2020, 3:14 PM

## 2020-01-08 NOTE — Evaluation (Signed)
Physical Therapy Evaluation Patient Details Name: Erika Stanton MRN: [id number] DOB: 05/18/1955 Today's Date: 01/08/2020   History of Present Illness  64 y.o. female with no past medical history.  Per husband patient had walked outside then came in to the house complaining of a headache at 9 10 in the morning.  She then had gone into the shower and called for husband due to left arm and left leg weakness. 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 Pt underwent coil embolization of RICA aneurysm on 8/25 with additional finding of LICA aneurysm.  Clinical Impression  Pt presents to PT with deficits in functional mobility, gait, balance, endurance, strength, power, cognition, safety awareness, and communication. Pt is lethargic during session but does communicate some verbally and through head nods. Pt follows simple one step commands, requiring significant physical assistance for all functional mobility at this time. Command following does seem to improve with sitting upright. Pt will benefit from continued acute PT POC to improve mobility quality and to reduce caregiver burden. PT recommends CIR at this time as the pt is believed to be independent prior to admission and demonstrates the potential to make significant functional gains with high intensity inpatient PT services.    Follow Up Recommendations CIR    Equipment Recommendations  Wheelchair (measurements PT);Wheelchair cushion (measurements PT);Hospital bed (mechanical lift if home today)    Recommendations for Other Services Rehab consult     Precautions / Restrictions Precautions Precautions: Fall Restrictions Weight Bearing Restrictions: No      Mobility  Bed Mobility Overal bed mobility: Needs Assistance Bed Mobility: Supine to Sit;Sit to Supine     Supine to sit: Total assist;+2 for physical assistance Sit to supine: Total assist;+2 for physical assistance      Transfers Overall transfer level:  Needs assistance Equipment used: 2 person hand held assist Transfers: Sit to/from Stand Sit to Stand: Max assist;+2 physical assistance         General transfer comment: pt requires bilateral knee block and BUE support, performs better with verbal cues for initiation of stand rather than tactile  Ambulation/Gait                Stairs            Wheelchair Mobility    Modified Rankin (Stroke Patients Only) Modified Rankin (Stroke Patients Only) Pre-Morbid Rankin Score: No symptoms Modified Rankin: Severe disability     Balance Overall balance assessment: Needs assistance Sitting-balance support: No upper extremity supported;Feet supported Sitting balance-Leahy Scale: Zero Sitting balance - Comments: maxA Postural control: Posterior lean;Right lateral lean;Left lateral lean Standing balance support: Bilateral upper extremity supported Standing balance-Leahy Scale: Zero Standing balance comment: maxA of 2                             Pertinent Vitals/Pain Pain Assessment: Faces Faces Pain Scale: Hurts even more Pain Location: generalized Pain Descriptors / Indicators: Moaning Pain Intervention(s): Monitored during session    Home Living Family/patient expects to be discharged to:: Private residence Living Arrangements: Spouse/significant other Available Help at Discharge: Family (unsure on availability) Type of Home:  (unable to determine)           Additional Comments: pt unable to report history 2/2 lethargy and impaired communication    Prior Function Level of Independence: Independent         Comments: per chart review     Hand Dominance  Extremity/Trunk Assessment   Upper Extremity Assessment Upper Extremity Assessment: Defer to OT evaluation    Lower Extremity Assessment Lower Extremity Assessment: Generalized weakness (at least 3/5 based on observed movement)    Cervical / Trunk Assessment Cervical / Trunk  Assessment: Kyphotic  Communication   Communication: Expressive difficulties;Receptive difficulties  Cognition Arousal/Alertness: Lethargic Behavior During Therapy: Flat affect Overall Cognitive Status: Difficult to assess                                 General Comments: pt follows one step commands with increased time, demonstrates slowed processing, minimal verbal communication and does respond with some head nods during session      General Comments General comments (skin integrity, edema, etc.): BP read over A-line, pressure in 150's with mobility, up to max of 188 systolic when standing, RN present during session and assisting. Pt on 2L [place] initially, removed during session with stable sats on RA    Exercises     Assessment/Plan    PT Assessment Patient needs continued PT services  PT Problem List Decreased strength;Decreased activity tolerance;Decreased balance;Decreased mobility;Decreased cognition;Decreased knowledge of use of DME;Decreased safety awareness;Cardiopulmonary status limiting activity;Decreased knowledge of precautions       PT Treatment Interventions DME instruction;Gait training;Stair training;Functional mobility training;Therapeutic activities;Therapeutic exercise;Balance training;Neuromuscular re-education;Cognitive remediation;Patient/family education;Wheelchair mobility training    PT Goals (Current goals can be found in the Care Plan section)  Acute Rehab PT Goals Patient Stated Goal: Pt unable to participate in goal setting PT Goal Formulation: Patient unable to participate in goal setting Time For Goal Achievement: 01/22/20 Potential to Achieve Goals: Fair    Frequency Min 4X/week   Barriers to discharge        Co-evaluation PT/OT/SLP Co-Evaluation/Treatment: Yes Reason for Co-Treatment: Complexity of the patient's impairments (multi-system involvement);For patient/therapist safety;To address functional/ADL transfers;Necessary to  address cognition/behavior during functional activity PT goals addressed during session: Mobility/safety with mobility;Balance;Strengthening/ROM         AM-PAC PT "6 Clicks" Mobility  Outcome Measure Help needed turning from your back to your side while in a flat bed without using bedrails?: Total Help needed moving from lying on your back to sitting on the side of a flat bed without using bedrails?: Total Help needed moving to and from a bed to a chair (including a wheelchair)?: Total Help needed standing up from a chair using your arms (e.g., wheelchair or bedside chair)?: Total Help needed to walk in hospital room?: Total Help needed climbing 3-5 steps with a railing? : Total 6 Click Score: 6    End of Session   Activity Tolerance: Patient tolerated treatment well Patient left: in bed;with call bell/phone within reach;with bed alarm set;with nursing/sitter in room Nurse Communication: Mobility status;Need for lift equipment PT Visit Diagnosis: Other abnormalities of gait and mobility (R26.89);Muscle weakness (generalized) (M62.81);Other symptoms and signs involving the nervous system (R29.898);Pain Pain - part of body:  (head)    Time: 4166-0630 PT Time Calculation (min) (ACUTE ONLY): 12 min   Charges:   PT Evaluation $PT Eval Moderate Complexity: 1 Mod          Zenaida Niece, PT, DPT Acute Rehabilitation Pager: [phone]   Zenaida Niece 01/08/2020, 5:16 PM

## 2020-01-08 NOTE — Procedures (Addendum)
Patient Name: Erika Stanton  MRN: [id number]  Epilepsy Attending: Lora Havens  Referring Physician/Provider: Dr. Dyann Ruddle Duration: 01/07/2020 1423  to 8/27/201 1301  Patient history: 64 y.o.femaleSAHd#2Hunt-Hess 4 Fisher 4, POD1stent-supported coil embolization of large RICA aneurysm.  Continues to be altered.  EEG to evaluate for seizures.  Level of alertness: Comatose  AEDs during EEG study: Keppra  Technical aspects: This EEG study was done with scalp electrodes positioned according to the 10-20 International system of electrode placement. Electrical activity was acquired at a sampling rate of 500Hz  and reviewed with a high frequency filter of 70Hz  and a low frequency filter of 1Hz . EEG data were recorded continuously and digitally stored.   Description: EEG showed continuous generalized and lateralized left hemisphere polymorphic 3 to 6 Hz theta-delta slowing. Intermittent rhythmic 2 to 3 Hz delta slowing is also noted in left hemisphere Hyperventilation and photic stimulation were not performed.     ABNORMALITY -Continuous rhythmic slow, generalized and lateralized left hemisphere -Intermittent rhythmic slow, left hemisphere  IMPRESSION: This study showed evidence of cortical dysfunction in left hemisphere likely secondary to underlying stroke.  Lateralized rhythmic delta activity in left hemisphere is on the ictal-interictal continuum with low propensity for seizures.  There is also severe diffuse encephalopathy, nonspecific to etiology.  No seizures or definite epileptiform discharges were seen throughout the recording.  Erika Stanton

## 2020-01-09 LAB — CBC WITH DIFFERENTIAL/PLATELET
Abs Immature Granulocytes: 0.07 10*3/uL (ref 0.00–0.07)
Basophils Absolute: 0 10*3/uL (ref 0.0–0.1)
Basophils Relative: 0 %
Eosinophils Absolute: 0.1 10*3/uL (ref 0.0–0.5)
Eosinophils Relative: 1 %
HCT: 35.1 % — ABNORMAL LOW (ref 36.0–46.0)
Hemoglobin: 11.1 g/dL — ABNORMAL LOW (ref 12.0–15.0)
Immature Granulocytes: 1 %
Lymphocytes Relative: 21 %
Lymphs Abs: 2.3 10*3/uL (ref 0.7–4.0)
MCH: 30.8 pg (ref 26.0–34.0)
MCHC: 31.6 g/dL (ref 30.0–36.0)
MCV: 97.5 fL (ref 80.0–100.0)
Monocytes Absolute: 0.7 10*3/uL (ref 0.1–1.0)
Monocytes Relative: 6 %
Neutro Abs: 8.1 10*3/uL — ABNORMAL HIGH (ref 1.7–7.7)
Neutrophils Relative %: 71 %
Platelets: 265 10*3/uL (ref 150–400)
RBC: 3.6 MIL/uL — ABNORMAL LOW (ref 3.87–5.11)
RDW: 13.2 % (ref 11.5–15.5)
WBC: 11.2 10*3/uL — ABNORMAL HIGH (ref 4.0–10.5)
nRBC: 0 % (ref 0.0–0.2)

## 2020-01-09 LAB — BASIC METABOLIC PANEL
Anion gap: 11 (ref 5–15)
BUN: 16 mg/dL (ref 8–23)
CO2: 24 mmol/L (ref 22–32)
Calcium: 8.8 mg/dL — ABNORMAL LOW (ref 8.9–10.3)
Chloride: 108 mmol/L (ref 98–111)
Creatinine, Ser: 0.49 mg/dL (ref 0.44–1.00)
GFR calc Af Amer: >60 mL/min (ref >60)
GFR calc non Af Amer: >60 mL/min (ref >60)
Glucose, Bld: 157 mg/dL — ABNORMAL HIGH (ref 70–99)
Potassium: 4 mmol/L (ref 3.5–5.1)
Sodium: 143 mmol/L (ref 135–145)

## 2020-01-09 LAB — GLUCOSE, CAPILLARY
Glucose-Capillary: 134 mg/dL — ABNORMAL HIGH (ref 70–99)
Glucose-Capillary: 125 mg/dL — ABNORMAL HIGH (ref 70–99)
Glucose-Capillary: 106 mg/dL — ABNORMAL HIGH (ref 70–99)
Glucose-Capillary: 145 mg/dL — ABNORMAL HIGH (ref 70–99)
Glucose-Capillary: 134 mg/dL — ABNORMAL HIGH (ref 70–99)
Glucose-Capillary: 113 mg/dL — ABNORMAL HIGH (ref 70–99)

## 2020-01-09 LAB — MAGNESIUM: Magnesium: 2.1 mg/dL (ref 1.7–2.4)

## 2020-01-09 LAB — PHOSPHORUS: Phosphorus: 2.6 mg/dL (ref 2.5–4.6)

## 2020-01-09 MED ORDER — LEVETIRACETAM 100 MG/ML PO SOLN
500.0000 mg | Freq: Two times a day (BID) | ORAL | Status: DC
  Administered 2020-01-09 – 2020-01-17 (×16): 500 mg
  Filled 2020-01-09 (×16): qty 5

## 2020-01-09 MED ORDER — SENNOSIDES-DOCUSATE SODIUM 8.6-50 MG PO TABS
1.0000 | ORAL_TABLET | Freq: Two times a day (BID) | ORAL | Status: DC
  Administered 2020-01-09 – 2020-01-19 (×4): 1
  Filled 2020-01-09 (×11): qty 1

## 2020-01-09 NOTE — Progress Notes (Signed)
NAME:  Erika Stanton, MRN:  [id number], DOB:  Dec 17, 1955, LOS: 4 ADMISSION DATE:  01/05/2020, CONSULTATION DATE: 01/09/20  REFERRING MD:  Kerney Elbe, MD, CHIEF COMPLAINT:  Right sided headache, weakness   Brief History   Erika Stanton is a 64 y.o. 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 Since presentation, she has underwent placement of a flow diverting stent with IR.   History of present illness   Erika Stanton is a 64 y.o. female with no past medical history. Patient had walked outside then returned to the house complaining of a headache at 9/10 in the morning.  She later notified her husband of left arm and left leg weakness.  EMS was called. On arrival to the ED she was drowsy but complained of right-sided headache and left-sided weakness. She reported her headache was located along her right forehead. A STAT head CT was obtained which showed both intraventricular and intraparenchymal hemorrhage. 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   Past Medical History  None  Significant Hospital Events   8/24 - Admitted  Consults:  Neurosurgery Neurology Interventional Radiology  Procedures:  8/25 - Diagnostic cerebral angiogram, stent-supported coil embolization of RICA aneurysm  Significant Diagnostic Tests:  8/24 - CT Head Code Stroke Wo Contrast: Intra-axial hemorrhage at the right basal ganglia with an estimated volume of 18 mL, plus a small volume of subarachnoid hemorrhage and bilateral intraventricular blood.  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  Micro Data:  None  Antimicrobials:  None  Interim history/subjective:  No acute events overnight.  Mental status remains unchanged.  Continues to have periodic restless legs symmetrically.  Not following commands.  Objective   Blood pressure (!) 150/75, pulse 93, temperature 98.7 F (37.1 C), temperature source Axillary, resp. rate (!) 22, height 5\' 6"  (1.676 m), weight 86.5 kg, SpO2 98 %.        Intake/Output Summary (Last 24 hours) at 01/09/2020 1506 Last data filed at 01/09/2020 0700 Gross per 24 hour  Intake 2189.55 ml  Output 1550 ml  Net 639.55 ml   Filed Weights   01/05/20 1129 01/08/20 0500 01/09/20 0500  Weight: (S) 86.7 kg 86.7 kg 86.5 kg    Examination: General: Resting, NG tube in place, well-nourished, no acute distress HENT: Normocephalic, atraumatic, mucus membranes moist Lungs: CTAB, no increased work of breathing Cardiovascular: RRR, Normal S1, S2 Abdomen: NABS, nontender, nondistended, no HSM Extremities: Pulses 2+ and symmetric bilaterally Neuro: Somnolent, not following commands eyes closed moans incomprehensibly at times.  Right upper extremity hemiplegia.  Moves both lower extremities symmetrically.  Resolved Hospital Problem list   N/A  Assessment & Plan:   Critically ill due to ruptured Giant Aneurysm (20 mm) causing Acute Intracranial Hemorrhage at R Basal Ganglia Status post coiling Periprocedural ischemic stroke. 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  Plan:  Allow blood pressure to rise to 782 systolic. Follow serial exam.  Entering vasospasm window, at high risk for secondary ischemic neurological injury  Follow serial TCD's.  Augment if velocities increase or showing signs of clinical vasospasm.   Daily Goals Checklist  Pain/Anxiety/Delirium protocol (if indicated): No sedation Neuro vitals: every 2  hours AED's: On Keppra VAP protocol (if indicated): Not intubated Respiratory support goals: Encourage deep breathing. Blood pressure target: Systolic 834-196 DVT prophylaxis: SCDs.  Start chemical full prophylaxis in 72 hours Nutrition Status: Tube feeds at goal GI prophylaxis: Not indicated Fluid status goals: Maintain euvolemia.  Normal saline at 50 an hour Urinary catheter: Assessment of intravascular volume at risk for cerebral salt wasting. Central lines: Peripheral IVs only Glucose control: Euglycemic sliding scale insulin. Mobility/therapy needs: PT OT following. Antibiotic de-escalation: No antibiotics Home medication reconciliation: On hold Daily labs: CBC, BMP daily Code Status: Full code Family Communication: Husband updated by neurosurgery. Disposition: ICU.   Labs   CBC: Recent Labs  Lab 01/05/20 0956 01/05/20 0956 01/05/20 1002 01/07/20 0838 01/07/20 1015 01/08/20 0509 01/09/20 0553  WBC 9.0  --   --   --  14.0* 14.1* 11.2*  NEUTROABS 4.3  --   --   --   --  10.8* 8.1*  HGB 12.2   < > 12.6 10.9* 10.5* 11.3* 11.1*  HCT 39.0   < > 37.0 32.0* 32.3* 35.6* 35.1*  MCV 96.1  --   --   --  96.1 97.3 97.5  PLT 329  --   --   --  251 267 265   < > = values in this interval not displayed.    Basic Metabolic Panel: Recent Labs  Lab 01/05/20 0956 01/05/20 0956 01/05/20 1002 01/07/20 0838 01/07/20 1015 01/07/20 1322 01/07/20 1700 01/08/20 0509 01/09/20 0553  NA 139   < > 139 140 140  --   --  140 143  K 3.4*   < > 3.3* 3.1* 3.2*  --   --  3.6 4.0  CL 102  --  104  --  107  --   --  108 108  CO2 25  --   --   --  25  --   --  24 24  GLUCOSE 168*  --  166*  --  134*  --   --  144* 157*  BUN 10  --  12  --  6*  --   --  11 16  CREATININE 0.65  --  0.60  --  0.47  --   --  0.45 0.49  CALCIUM 9.0  --   --   --  8.3*  --   --  8.6* 8.8*  MG  --   --   --   --   --  2.1 2.1 2.1 2.1  PHOS  --   --   --   --   --  2.0* 1.4* 1.2* 2.6   < > = values in this  interval not displayed.   GFR: Estimated Creatinine Clearance: 79.8 mL/min (by C-G formula based on SCr of 0.49 mg/dL). Recent Labs  Lab 01/05/20 0956 01/07/20 1015 01/08/20 0509 01/09/20 0553  WBC 9.0 14.0* 14.1* 11.2*    Liver Function Tests: Recent Labs  Lab 01/05/20 0956  AST 26  ALT 20  ALKPHOS 74  BILITOT 0.8  PROT 6.4*  ALBUMIN 3.9   No results for input(s): LIPASE, AMYLASE in the last 168 hours. No results for input(s): AMMONIA in the last 168 hours.  ABG    Component Value Date/Time   PHART 7.460 (H) 01/07/2020 2229  PCO2ART 37.3 01/07/2020 0838   PO2ART 85 01/07/2020 0838   HCO3 26.6 01/07/2020 0838   TCO2 28 01/07/2020 0838   O2SAT 97.0 01/07/2020 0838     Coagulation Profile: Recent Labs  Lab 01/05/20 0956 01/05/20 1211  INR 1.0 1.0    Cardiac Enzymes: No results for input(s): CKTOTAL, CKMB, CKMBINDEX, TROPONINI in the last 168 hours.  HbA1C: Hgb A1c MFr Bld  Date/Time Value Ref Range Status  01/05/2020 12:11 PM 6.0 (H) 4.8 - 5.6 % Final    Comment:    (NOTE)         Prediabetes: 5.7 - 6.4         Diabetes: >6.4         Glycemic control for adults with diabetes: <7.0     CBG: Recent Labs  Lab 01/08/20 1935 01/08/20 2311 01/09/20 0320 01/09/20 0808 01/09/20 1157  GLUCAP 131* 140* 134* 125* 106*   CRITICAL CARE Performed by: Kipp Brood   Total critical care time: 35 minutes  Critical care time was exclusive of separately billable procedures and treating other patients.  Critical care was necessary to treat or prevent imminent or life-threatening deterioration.  Critical care was time spent personally by me on the following activities: development of treatment plan with patient and/or surrogate as well as nursing, discussions with consultants, evaluation of patient's response to treatment, examination of patient, obtaining history from patient or surrogate, ordering and performing treatments and interventions, ordering and  review of laboratory studies, ordering and review of radiographic studies, pulse oximetry, re-evaluation of patient's condition and participation in multidisciplinary rounds.  Kipp Brood, MD [facility] [place] ICU Physician [place]  Pager: [phone] Mobile: [phone] After hours: [phone].

## 2020-01-09 NOTE — Progress Notes (Signed)
SLP Cancellation Note  Patient Details Name: Erika Stanton MRN: [id number] DOB: 05/24/1955   Cancelled treatment:        Spoke with RN who reported pt barely opens eyes to sternal rub. Will continue efforts   Houston Siren 01/09/2020, 11:38 AM  Orbie Pyo Colvin Caroli.Ed Risk analyst [phone] Office [phone]  '

## 2020-01-09 NOTE — Progress Notes (Signed)
No new problems or developments overnight.  Patient stable.  Will open her eyes to noxious stimuli.  Does not appear to be aware.  Withdraws her right upper extremity in both lower extremities.  No wound left.  Pupils midposition and reactive.  Status post coiling carotid terminus aneurysm. Continue calcium channel blockers Keppra.

## 2020-01-09 NOTE — Progress Notes (Signed)
Inpatient Rehab Admissions Coordinator Note:   Per Pt/OT recommendations, pt was screened for CIR candidacy by Gayland Curry, MS, CCC-SLp.  At this time we are not recommending a CIR consult.  We will continue to monitor pt's progress with acute therapies from afar.  Please contact me with questions.    Gayland Curry, [place], [place] Admissions Coordinator [phone] 01/09/20 5:06 PM

## 2020-01-09 NOTE — Progress Notes (Signed)
Occupational Therapy Evaluation (late entry)  Pt seen in conjunction with PT with RN present, and CCM outside of room.  Pt able to state her name, and answer intermittent yes/no questions with a delay.  She followed ~20% of simple commands to hold head up, assist with standing, etc.  She required max - total A +2 for functional mobility, and total A for ADLs, and demonstrates Rt Hemiparesis - no movement noted Rt UE and significant edema noted.  Pt lethargic during eval.  Per chart review, pt resides with spouse and was fully independent.  Recommend CIR level rehab once medically stable and activity tolerance improves.    01/08/20 1600  OT Visit Information  Last OT Received On 01/09/20  Assistance Needed +2  PT/OT/SLP Co-Evaluation/Treatment Yes  Reason for Co-Treatment Complexity of the patient's impairments (multi-system involvement);Necessary to address cognition/behavior during functional activity;For patient/therapist safety;To address functional/ADL transfers  PT goals addressed during session Strengthening/ROM  History of Present Illness 64 y.o. female with no past medical history.  Per husband patient had walked outside then came in to the house complaining of a headache at 9 10 in the morning.  She then had gone into the shower and called for husband due to left arm and left leg weakness. 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 Pt underwent coil embolization of RICA aneurysm on 8/25 with additional finding of LICA aneurysm.  Precautions  Precautions Fall  Home Living  Family/patient expects to be discharged to: Private residence  Living Arrangements Spouse/significant other  Available Help at Discharge Family (unsure on availability)  Type of Home  (unable to determine)  Additional Comments pt unable to report history 2/2 lethargy and impaired communication  Prior Function  Level of Independence Independent  Comments per chart review  Communication   Communication Expressive difficulties;Receptive difficulties  Pain Assessment  Pain Assessment Faces  Faces Pain Scale 6 (however, when asked if she is in pain, she states "no")  Pain Location generalized  Pain Descriptors / Indicators Moaning  Cognition  Arousal/Alertness Lethargic  Behavior During Therapy Flat affect  Overall Cognitive Status Impaired/Different from baseline  Area of Impairment Attention;Following commands;Problem solving  Current Attention Level Focused  Following Commands Follows one step commands inconsistently;Follows one step commands with increased time  Problem Solving Decreased initiation;Slow processing;Requires verbal cues;Requires tactile cues;Difficulty sequencing  General Comments pt follows one step commands with increased time, demonstrates slowed processing, minimal verbal communication and does respond with some head nods during session  Difficult to assess due to Impaired communication;Level of arousal  Upper Extremity Assessment  Upper Extremity Assessment Generalized weakness;RUE deficits/detail;LUE deficits/detail  RUE Deficits / Details No active movement of Rt UE.  Significant edema noted.  Full PROM noted.    RUE Coordination decreased fine motor;decreased gross motor  LUE Deficits / Details Pt with minimal spontaneous movement noted.  A-Line present with board in place making assessment of Lt hand difficulty   LUE Coordination decreased fine motor;decreased gross motor  Lower Extremity Assessment  Lower Extremity Assessment Defer to PT evaluation  Cervical / Trunk Assessment  Cervical / Trunk Assessment Kyphotic;Other exceptions (significant trunk weakness with decreased control head/neck,)  ADL  Overall ADL's  Needs assistance/impaired  Eating/Feeding NPO  General ADL Comments Pt requires total A for all aspects.  unable to assist at this time   Vision- Assessment  Additional Comments Pt initially with Lt superior gaze.  Once her  attention was focused, her eyes drifted down to focus on me,  but does not perform eye shifts to fixate  Perception  Comments unable to accurately assess  Praxis  Praxis tested? Deficits  Deficits Initiation  Praxis-Other Comments To be further assessed   Bed Mobility  Overal bed mobility Needs Assistance  Bed Mobility Supine to Sit;Sit to Supine  Supine to sit Total assist;+2 for physical assistance  Sit to supine Total assist;+2 for physical assistance  Transfers  Overall transfer level Needs assistance  Equipment used 2 person hand held assist  Transfers Sit to/from Stand  Sit to Stand Max assist;+2 physical assistance  General transfer comment pt requires bilateral knee block and BUE support, performs better with verbal cues for initiation of stand rather than tactile  Balance  Overall balance assessment Needs assistance  Sitting-balance support No upper extremity supported;Feet supported  Sitting balance-Leahy Scale Zero  Sitting balance - Comments maxA  Postural control Posterior lean;Right lateral lean;Left lateral lean  Standing balance support Bilateral upper extremity supported  Standing balance-Leahy Scale Zero  Standing balance comment maxA of 2  General Comments  General comments (skin integrity, edema, etc.) BP read over A-line, pressure in 150s with mobility, up to max of 211 systolic when standing.  RN present  during session and assisting.  Pt on 02 initially, removed during session  with stable sats on RA   OT - End of Session  Activity Tolerance Patient limited by lethargy  Patient left in bed;with call bell/phone within reach;with nursing/sitter in room;with SCD's reapplied  Nurse Communication Mobility status  OT Assessment  OT Recommendation/Assessment Patient needs continued OT Services  OT Visit Diagnosis Unsteadiness on feet (R26.81);Muscle weakness (generalized) (M62.81);Cognitive communication deficit (R41.841);Hemiplegia and hemiparesis  Symptoms and  signs involving cognitive functions Nontraumatic SAH  Hemiplegia - Right/Left Right  Hemiplegia - dominant/non-dominant Dominant  Hemiplegia - caused by Nontraumatic SAH  OT Problem List Decreased strength;Decreased range of motion;Decreased activity tolerance;Impaired balance (sitting and/or standing);Impaired vision/perception;Decreased coordination;Decreased cognition;Decreased safety awareness;Decreased knowledge of use of DME or AE;Cardiopulmonary status limiting activity;Impaired tone;Impaired UE functional use  OT Plan  OT Frequency (ACUTE ONLY) Min 2X/week  OT Treatment/Interventions (ACUTE ONLY) Self-care/ADL training;Neuromuscular education;DME and/or AE instruction;Splinting;Manual therapy;Therapeutic activities;Cognitive remediation/compensation;Visual/perceptual remediation/compensation;Patient/family education;Balance training  AM-PAC OT "6 Clicks" Daily Activity Outcome Measure (Version 2)  Help from another person eating meals? 1  Help from another person taking care of personal grooming? 1  Help from another person toileting, which includes using toliet, bedpan, or urinal? 1  Help from another person bathing (including washing, rinsing, drying)? 1  Help from another person to put on and taking off regular upper body clothing? 1  Help from another person to put on and taking off regular lower body clothing? 1  6 Click Score 6  OT Recommendation  Recommendations for Other Services Rehab consult  Follow Up Recommendations CIR;Supervision/Assistance - 24 hour  OT Equipment None recommended by OT  Individuals Consulted  Consulted and Agree with Results and Recommendations Patient unable/family or caregiver not available  Acute Rehab OT Goals  Patient Stated Goal Pt unable to participate in goal setting  OT Goal Formulation Patient unable to participate in goal setting  Time For Goal Achievement 01/23/20  Potential to Achieve Goals Good  OT Time Calculation  OT Start Time  (ACUTE ONLY) 1625  OT Stop Time (ACUTE ONLY) 1648  OT Time Calculation (min) 23 min  OT General Charges  $OT Visit 1 Visit  OT Evaluation  $OT Eval Moderate Complexity 1 Mod  Written Expression  Dominant Hand  (uncertain)  Nilsa Nutting., OTR/L Acute Rehabilitation Services Pager [phone] Office [phone]

## 2020-01-10 LAB — CBC WITH DIFFERENTIAL/PLATELET
Abs Immature Granulocytes: 0.06 10*3/uL (ref 0.00–0.07)
Basophils Absolute: 0 10*3/uL (ref 0.0–0.1)
Basophils Relative: 0 %
Eosinophils Absolute: 0.1 10*3/uL (ref 0.0–0.5)
Eosinophils Relative: 1 %
HCT: 37.8 % (ref 36.0–46.0)
Hemoglobin: 12.2 g/dL (ref 12.0–15.0)
Immature Granulocytes: 1 %
Lymphocytes Relative: 20 %
Lymphs Abs: 2.3 10*3/uL (ref 0.7–4.0)
MCH: 31 pg (ref 26.0–34.0)
MCHC: 32.3 g/dL (ref 30.0–36.0)
MCV: 96.2 fL (ref 80.0–100.0)
Monocytes Absolute: 0.7 10*3/uL (ref 0.1–1.0)
Monocytes Relative: 6 %
Neutro Abs: 8.1 10*3/uL — ABNORMAL HIGH (ref 1.7–7.7)
Neutrophils Relative %: 72 %
Platelets: 293 10*3/uL (ref 150–400)
RBC: 3.93 MIL/uL (ref 3.87–5.11)
RDW: 13 % (ref 11.5–15.5)
WBC: 11.2 10*3/uL — ABNORMAL HIGH (ref 4.0–10.5)
nRBC: 0 % (ref 0.0–0.2)

## 2020-01-10 LAB — BASIC METABOLIC PANEL
Anion gap: 11 (ref 5–15)
BUN: 18 mg/dL (ref 8–23)
CO2: 23 mmol/L (ref 22–32)
Calcium: 8.9 mg/dL (ref 8.9–10.3)
Chloride: 103 mmol/L (ref 98–111)
Creatinine, Ser: 0.51 mg/dL (ref 0.44–1.00)
GFR calc Af Amer: >60 mL/min (ref >60)
GFR calc non Af Amer: >60 mL/min (ref >60)
Glucose, Bld: 162 mg/dL — ABNORMAL HIGH (ref 70–99)
Potassium: 4.4 mmol/L (ref 3.5–5.1)
Sodium: 137 mmol/L (ref 135–145)

## 2020-01-10 LAB — GLUCOSE, CAPILLARY
Glucose-Capillary: 130 mg/dL — ABNORMAL HIGH (ref 70–99)
Glucose-Capillary: 138 mg/dL — ABNORMAL HIGH (ref 70–99)
Glucose-Capillary: 138 mg/dL — ABNORMAL HIGH (ref 70–99)
Glucose-Capillary: 140 mg/dL — ABNORMAL HIGH (ref 70–99)
Glucose-Capillary: 146 mg/dL — ABNORMAL HIGH (ref 70–99)
Glucose-Capillary: 154 mg/dL — ABNORMAL HIGH (ref 70–99)

## 2020-01-10 NOTE — Progress Notes (Signed)
No new events or problems overnight.  Patient will awaken to stimuli.  She will state her name.  She mumbles a few other words.  Follows commands with her left upper extremity moves both lower extremities.  No movement of the right upper extremity.Appears to have right central facial weakness  Overall stable.  Supportive efforts, no new recommendations.

## 2020-01-10 NOTE — Progress Notes (Signed)
SLP Cancellation Note  Patient Details Name: Erika Stanton MRN: [id number] DOB: 07/04/1955   Cancelled treatment:       Reason Eval/Treat Not Completed: Medical issues which prohibited therapy- pt remains obtunded.  Will continue to follow along for readiness.  [name] L. Tivis Ringer, [place] Office number [phone] Pager [phone]    [name] 01/10/2020, 9:54 AM

## 2020-01-10 NOTE — Progress Notes (Signed)
NAME:  Erika Stanton, MRN:  [id number], DOB:  [birth year], LOS: 5 ADMISSION DATE:  01/05/2020, CONSULTATION DATE: 01/10/20  REFERRING MD:  Kerney Elbe, MD, CHIEF COMPLAINT:  Right sided headache, weakness   Brief History   Erika Stanton is a 64 y.o. 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 Since presentation, she has underwent placement of a flow diverting stent with IR.   History of present illness   Erika Stanton is a 64 y.o. female with no past medical history. Patient had walked outside then returned to the house complaining of a headache at 9/10 in the morning.  She later notified her husband of left arm and left leg weakness.  EMS was called. On arrival to the ED she was drowsy but complained of right-sided headache and left-sided weakness. She reported her headache was located along her right forehead. A STAT head CT was obtained which showed both intraventricular and intraparenchymal hemorrhage. 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   Past Medical History  None  Significant Hospital Events   8/24 - Admitted  Consults:  Neurosurgery Neurology Interventional Radiology  Procedures:  8/25 - Diagnostic cerebral angiogram, stent-supported coil embolization of RICA aneurysm  Significant Diagnostic Tests:  8/24 - CT Head Code Stroke Wo Contrast: Intra-axial hemorrhage at the right basal ganglia with an estimated volume of 18 mL, plus a small volume of subarachnoid hemorrhage and bilateral intraventricular blood.  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  Micro Data:  None  Antimicrobials:  None  Interim history/subjective:  No acute events overnight.  Mental status remains unchanged.  Continues to have periodic restless legs symmetrically.  Not following commands.  Objective   Blood pressure 128/72, pulse 70, temperature 99 F (37.2 C), temperature source Axillary, resp. rate 19, height 5\' 6"  (1.676 m), weight 86.5 kg, SpO2 97 %.        Intake/Output Summary (Last 24 hours) at 01/10/2020 1442 Last data filed at 01/10/2020 1100 Gross per 24 hour  Intake 2047.81 ml  Output 3550 ml  Net -1502.19 ml   Filed Weights   01/08/20 0500 01/09/20 0500 01/10/20 0500  Weight: 86.7 kg 86.5 kg 86.5 kg    Examination: General: Resting, NG tube in place, well-nourished, no acute distress HENT: Normocephalic, atraumatic, mucus membranes moist Lungs: CTAB, no increased work of breathing Cardiovascular: RRR, Normal S1, S2 Abdomen: NABS, nontender, nondistended, no HSM Extremities: Pulses 2+ and symmetric bilaterally Neuro: Somnolent, not following commands eyes closed moans incomprehensibly at times.  Right upper extremity hemiplegia.  Moves both lower extremities symmetrically.  Resolved Hospital Problem list   N/A  Assessment & Plan:   Critically ill due to ruptured Giant Aneurysm (20 mm) causing Acute Intracranial Hemorrhage at R Basal Ganglia Status post coiling Periprocedural ischemic stroke. 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  Plan:  Allow blood pressure to rise to 412 systolic. Follow serial exam.  Entering vasospasm window, at high risk for secondary ischemic neurological injury Follow serial TCD's.  Augment if velocities increase or showing signs of clinical vasospasm.   Daily Goals Checklist  Pain/Anxiety/Delirium protocol (if indicated): No sedation Neuro vitals: every 2 hours AED's: On  Keppra VAP protocol (if indicated): Not intubated Respiratory support goals: Encourage deep breathing. Blood pressure target: Systolic 397-673 DVT prophylaxis: SCDs.  Start chemical full prophylaxis in 72 hours Nutrition Status: Tube feeds at goal GI prophylaxis: Not indicated Fluid status goals: Maintain euvolemia.  Normal saline at 50 an hour Urinary catheter: Assessment of intravascular volume at risk for cerebral salt wasting. Central lines: Peripheral IVs only Glucose control: Euglycemic sliding scale insulin. Mobility/therapy needs: PT OT following. Antibiotic de-escalation: No antibiotics Home medication reconciliation: On hold Daily labs: CBC, BMP daily Code Status: Full code Family Communication: Husband updated by neurosurgery. Disposition: ICU.   Labs   CBC: Recent Labs  Lab 01/05/20 0956 01/05/20 1002 01/07/20 0838 01/07/20 1015 01/08/20 0509 01/09/20 0553 01/10/20 0545  WBC 9.0  --   --  14.0* 14.1* 11.2* 11.2*  NEUTROABS 4.3  --   --   --  10.8* 8.1* 8.1*  HGB 12.2   < > 10.9* 10.5* 11.3* 11.1* 12.2  HCT 39.0   < > 32.0* 32.3* 35.6* 35.1* 37.8  MCV 96.1  --   --  96.1 97.3 97.5 96.2  PLT 329  --   --  251 267 265 293   < > = values in this interval not displayed.    Basic Metabolic Panel: Recent Labs  Lab 01/05/20 0956 01/05/20 0956 01/05/20 1002 01/05/20 1002 01/07/20 0838 01/07/20 1015 01/07/20 1322 01/07/20 1700 01/08/20 0509 01/09/20 0553 01/10/20 0545  NA 139   < > 139   < > 140 140  --   --  140 143 137  K 3.4*   < > 3.3*   < > 3.1* 3.2*  --   --  3.6 4.0 4.4  CL 102   < > 104  --   --  107  --   --  108 108 103  CO2 25  --   --   --   --  25  --   --  24 24 23   GLUCOSE 168*   < > 166*  --   --  134*  --   --  144* 157* 162*  BUN 10   < > 12  --   --  6*  --   --  11 16 18   CREATININE 0.65   < > 0.60  --   --  0.47  --   --  0.45 0.49 0.51  CALCIUM 9.0  --   --   --   --  8.3*  --   --  8.6* 8.8* 8.9  MG  --   --   --   --   --   --   2.1 2.1 2.1 2.1  --   PHOS  --   --   --   --   --   --  2.0* 1.4* 1.2* 2.6  --    < > = values in this interval not displayed.   GFR: Estimated Creatinine Clearance: 79.8 mL/min (by C-G formula based on SCr of 0.51 mg/dL). Recent Labs  Lab 01/07/20 1015 01/08/20 0509 01/09/20 0553 01/10/20 0545  WBC 14.0* 14.1* 11.2* 11.2*    Liver Function Tests: Recent Labs  Lab 01/05/20 0956  AST 26  ALT 20  ALKPHOS 74  BILITOT 0.8  PROT 6.4*  ALBUMIN 3.9   No results for input(s): LIPASE, AMYLASE in the last 168 hours. No results for input(s): AMMONIA in the last 168 hours.  ABG    Component Value Date/Time   PHART 7.460 (H) 01/07/2020 0838   PCO2ART 37.3 01/07/2020 0838   PO2ART 85 01/07/2020 0838   HCO3 26.6 01/07/2020 0838   TCO2 28 01/07/2020 0838   O2SAT 97.0 01/07/2020 0838     Coagulation Profile: Recent Labs  Lab 01/05/20 0956 01/05/20 1211  INR 1.0 1.0    Cardiac Enzymes: No results for input(s): CKTOTAL, CKMB, CKMBINDEX, TROPONINI in the last 168 hours.  HbA1C: Hgb A1c MFr Bld  Date/Time Value Ref Range Status  01/05/2020 12:11 PM 6.0 (H) 4.8 - 5.6 % Final    Comment:    (NOTE)         Prediabetes: 5.7 - 6.4         Diabetes: >6.4         Glycemic control for adults with diabetes: <7.0     CBG: Recent Labs  Lab 01/09/20 1935 01/09/20 2316 01/10/20 0332 01/10/20 0816 01/10/20 1143  GLUCAP 134* 113* 140* 138* 154*   CRITICAL CARE Performed by: Kipp Brood   Total critical care time: 35 minutes  Critical care time was exclusive of separately billable procedures and treating other patients.  Critical care was necessary to treat or prevent imminent or life-threatening deterioration.  Critical care was time spent personally by me on the following activities: development of treatment plan with patient and/or surrogate as well as nursing, discussions with consultants, evaluation of patient's response to treatment, examination of patient,  obtaining history from patient or surrogate, ordering and performing treatments and interventions, ordering and review of laboratory studies, ordering and review of radiographic studies, pulse oximetry, re-evaluation of patient's condition and participation in multidisciplinary rounds.  Kipp Brood, MD [facility] ICU Physician [place]  Pager: [phone] Mobile: [phone] After hours: [phone].

## 2020-01-11 ENCOUNTER — Inpatient Hospital Stay (HOSPITAL_COMMUNITY): Payer: BC Managed Care – PPO

## 2020-01-11 DIAGNOSIS — R569 Unspecified convulsions: Secondary | ICD-10-CM

## 2020-01-11 LAB — CBC WITH DIFFERENTIAL/PLATELET
Abs Immature Granulocytes: 0.06 10*3/uL (ref 0.00–0.07)
Basophils Absolute: 0 10*3/uL (ref 0.0–0.1)
Basophils Relative: 0 %
Eosinophils Absolute: 0.2 10*3/uL (ref 0.0–0.5)
Eosinophils Relative: 1 %
HCT: 38 % (ref 36.0–46.0)
Hemoglobin: 12.5 g/dL (ref 12.0–15.0)
Immature Granulocytes: 1 %
Lymphocytes Relative: 13 %
Lymphs Abs: 1.6 10*3/uL (ref 0.7–4.0)
MCH: 31.3 pg (ref 26.0–34.0)
MCHC: 32.9 g/dL (ref 30.0–36.0)
MCV: 95.2 fL (ref 80.0–100.0)
Monocytes Absolute: 0.8 10*3/uL (ref 0.1–1.0)
Monocytes Relative: 7 %
Neutro Abs: 9.4 10*3/uL — ABNORMAL HIGH (ref 1.7–7.7)
Neutrophils Relative %: 78 %
Platelets: 327 10*3/uL (ref 150–400)
RBC: 3.99 MIL/uL (ref 3.87–5.11)
RDW: 12.9 % (ref 11.5–15.5)
WBC: 12.1 10*3/uL — ABNORMAL HIGH (ref 4.0–10.5)
nRBC: 0 % (ref 0.0–0.2)

## 2020-01-11 LAB — GLUCOSE, CAPILLARY
Glucose-Capillary: 137 mg/dL — ABNORMAL HIGH (ref 70–99)
Glucose-Capillary: 137 mg/dL — ABNORMAL HIGH (ref 70–99)
Glucose-Capillary: 172 mg/dL — ABNORMAL HIGH (ref 70–99)
Glucose-Capillary: 145 mg/dL — ABNORMAL HIGH (ref 70–99)
Glucose-Capillary: 120 mg/dL — ABNORMAL HIGH (ref 70–99)

## 2020-01-11 MED ORDER — ORAL CARE MOUTH RINSE
15.0000 mL | Freq: Two times a day (BID) | OROMUCOSAL | Status: DC
  Administered 2020-01-12 – 2020-01-20 (×17): 15 mL via OROMUCOSAL

## 2020-01-11 MED ORDER — CHLORHEXIDINE GLUCONATE 0.12 % MT SOLN
15.0000 mL | Freq: Two times a day (BID) | OROMUCOSAL | Status: DC
  Administered 2020-01-11 – 2020-01-20 (×18): 15 mL via OROMUCOSAL
  Filled 2020-01-11 (×16): qty 15

## 2020-01-11 NOTE — Progress Notes (Signed)
  Speech Language Pathology Treatment: Dysphagia  Patient Details Name: Erika Stanton MRN: [id number] DOB: 1955-09-29 Today's Date: 01/11/2020 Time: 7793-9030 SLP Time Calculation (min) (ACUTE ONLY): 11 min  Assessment / Plan / Recommendation Clinical Impression  Pt kept her eyes closed throughout session but did follow a few one-step commands. She did not however follow any oral motor commands for me or respond to any yes/no questions. Vocalization noted x1 during moaning. Despite Max multimodal cueing, she does not show awareness of boluses or make any attempts to accept them. Saliva was noted to be pooling in her mouth and was removed via suction. Would remain NPO for now with ongoing use of Cortrak.    HPI HPI: Pt is a 64 yo female presenting with acute headache and L hemiplegia. CT Head was positive for acute ICH at the R basal ganglia with small volume of SAH and IVH and trace leftward midline shift. CTA revealed a large ruptured R ICA terminus aneurysm and smaller unruptured left ICA terminus aneurysm. No known PMH.       SLP Plan  Continue with current plan of care       Recommendations  Diet recommendations: NPO Medication Administration: Via alternative means                Oral Care Recommendations: Oral care QID Follow up Recommendations:  (tba) SLP Visit Diagnosis: Dysphagia, unspecified (R13.10) Plan: Continue with current plan of care       GO                Osie Bond., M.A. [place] Acute Rehabilitation Services Pager [phone] Office [phone]  01/11/2020, 12:40 PM

## 2020-01-11 NOTE — Progress Notes (Addendum)
  NEUROSURGERY PROGRESS NOTE   No issues overnight.  EXAM:  BP (!) 168/63   Pulse 85   Temp (!) 100.6 F (38.1 C) (Axillary)   Resp (!) 24   Ht 5\' 6"  (1.676 m)   Wt 86.5 kg   SpO2 99%   BMI 30.78 kg/m   Drowsy Not opening eyes to voice PERRL incomprehensible speech Moves LUE and BLE spontaneously, no movement RUE Follows commands by moving feet b/l  IMPRESSION/PLAN 64 y.o. female SAH d#6 POD#5 stent-coil of large RICA terminus aneurysm. Some improvements compared to last week neurologically. - continue supportive care, keppra, nimotop - TCDs today - appreciate PCCM assistance with patient  ADDENDUM:  Pt s/e this am  Agree with above  Spasm watch, neuro stable, following commands TCDs MWF   Thank you for allowing me to participate in this patient's care.  Please do not hesitate to call with questions or concerns.   Elwin Sleight, [facility] Cell: [phone]

## 2020-01-11 NOTE — Procedures (Signed)
Cortrak  Person Inserting Tube:  Esaw Dace, RD Tube Type:  Cortrak - 43 inches Tube Location:  Right nare Initial Placement:  Stomach Secured by: Bridle Technique Used to Measure Tube Placement:  Documented cm marking at nare/ corner of mouth Cortrak Secured At:  65 cm    Cortrak Tube Team Note:  Consult received to place a Cortrak feeding tube.   No x-ray is required. RN may begin using tube.    If the tube becomes dislodged please keep the tube and contact the Cortrak team at [email or web address] ([email or web address]) for replacement.  If after hours and replacement cannot be delayed, place a NG tube and confirm placement with an abdominal x-ray.    Kerman Passey MS, RDN, LDN, CNSC Registered Dietitian III Clinical Nutrition RD Pager and On-Call Pager Number Located in [place]   \

## 2020-01-11 NOTE — Progress Notes (Signed)
Transcranial Doppler  Date POD PCO2 HCT BP  MCA ACA PCA OPHT SIPH VERT Basilar  8/26 MR     Right  Left   47  52   -34  *   *  19   20  20    42  28   *  *   *      8/27 MS     Right  Left   25  *   *  *   *  *   20  19   33  27   *  *   *      8/30 MR     Right  Left   57  66   -30  -25   20  43   96  25   63  40   -20  -41   -32            Right  Left                                             Right  Left                                            Right  Left                                            Right  Left                                        MCA = Middle Cerebral Artery      OPHT = Opthalmic Artery     BASILAR = Basilar Artery   ACA = Anterior Cerebral Artery     SIPH = Carotid Siphon PCA = Posterior Cerebral Artery   VERT = Verterbral Artery                   Normal MCA = 62+\-12 ACA = 50+\-12 PCA = 42+\-23   01/08/20- study was very technically limited due to excessive patient movement.  Erika Stanton 01/11/2020 11:37 AM

## 2020-01-11 NOTE — Progress Notes (Signed)
Physical Therapy Treatment Patient Details Name: Erika Stanton MRN: [id number] DOB: [birth year] Today's Date: 01/11/2020    History of Present Illness 64 y.o. female with no past medical history.  Per husband patient had walked outside then came in to the house complaining of a headache at 9 10 in the morning.  She then had gone into the shower and called for husband due to left arm and left leg weakness. 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 Pt underwent coil embolization of RICA aneurysm on 8/25 with additional finding of LICA aneurysm.    PT Comments    Pt kept eyes closed the entire time but did engage with PT. Pt followed simple commands 60% of time with exception of R UE due to flaccidity. Pt remains to require totalAx2 for all mobility. Acute PT to cont to follow. Continue to recommend CIR as pt was indep PTA.    Follow Up Recommendations  CIR     Equipment Recommendations  Wheelchair (measurements PT);Wheelchair cushion (measurements PT);Hospital bed    Recommendations for Other Services Rehab consult     Precautions / Restrictions Precautions Precautions: Fall Restrictions Weight Bearing Restrictions: No    Mobility  Bed Mobility Overal bed mobility: Needs Assistance Bed Mobility: Supine to Sit;Sit to Supine     Supine to sit: Total assist;+2 for physical assistance Sit to supine: Total assist;+2 for physical assistance   General bed mobility comments: pt with no initiation of task or assist during the task  Transfers                 General transfer comment: not tested  Ambulation/Gait                 Stairs             Wheelchair Mobility    Modified Rankin (Stroke Patients Only) Modified Rankin (Stroke Patients Only) Pre-Morbid Rankin Score: No significant disability Modified Rankin: Severe disability     Balance Overall balance assessment: Needs assistance Sitting-balance support: No upper extremity  supported;Feet supported Sitting balance-Leahy Scale: Zero Sitting balance - Comments: maxA Postural control: Posterior lean;Right lateral lean;Left lateral lean                                  Cognition Arousal/Alertness: Lethargic Behavior During Therapy: Flat affect Overall Cognitive Status: Difficult to assess                         Following Commands: Follows one step commands inconsistently;Follows one step commands with increased time       General Comments: pt followed commands with bilat LEs and gave a thumbs up and showed 2 fingers wiht L hand to command. Pt kept eyes closed the entire session, pt with soft mumbling of yes/no      Exercises Other Exercises Other Exercises: pt actively tried to kick bilat LEs in sitting otherwise passive ROM completed to bilat UEs    General Comments General comments (skin integrity, edema, etc.): VSS      Pertinent Vitals/Pain Pain Assessment: Faces Faces Pain Scale: No hurt Pain Intervention(s): Monitored during session    Home Living                      Prior Function            PT Goals (current goals can now  be found in the care plan section) Acute Rehab PT Goals Patient Stated Goal: Pt unable to participate in goal setting Progress towards PT goals: Progressing toward goals    Frequency    Min 3X/week      PT Plan Frequency needs to be updated    Co-evaluation              AM-PAC PT "6 Clicks" Mobility   Outcome Measure  Help needed turning from your back to your side while in a flat bed without using bedrails?: Total Help needed moving from lying on your back to sitting on the side of a flat bed without using bedrails?: Total Help needed moving to and from a bed to a chair (including a wheelchair)?: Total Help needed standing up from a chair using your arms (e.g., wheelchair or bedside chair)?: Total Help needed to walk in hospital room?: Total Help needed  climbing 3-5 steps with a railing? : Total 6 Click Score: 6    End of Session   Activity Tolerance: Patient tolerated treatment well Patient left: in bed;with call bell/phone within reach;with bed alarm set;with nursing/sitter in room (in chair position) Nurse Communication: Mobility status;Need for lift equipment PT Visit Diagnosis: Other abnormalities of gait and mobility (R26.89);Muscle weakness (generalized) (M62.81);Other symptoms and signs involving the nervous system (R29.898);Pain     Time: 8185-9093 PT Time Calculation (min) (ACUTE ONLY): 15 min  Charges:  $Neuromuscular Re-education: 8-22 mins                     Kittie Plater, PT, DPT Acute Rehabilitation Services Pager #: [phone] Office #: [phone]    Berline Lopes 01/11/2020, 11:31 AM

## 2020-01-11 NOTE — Progress Notes (Deleted)
Transcranial Doppler  Date POD PCO2 HCT BP  MCA ACA PCA OPHT SIPH VERT Basilar  8/26 MR     Right  Left   47  52   -34  *   *  19   20  20    42  28   *  *   *      8/30 MR     Right  Left   57  66   -30  -25   20  43   18  25   40  40   -20  -41   -32           Right  Left                                             Right  Left                                             Right  Left                                            Right  Left                                            Right  Left                                        MCA = Middle Cerebral Artery      OPHT = Opthalmic Artery     BASILAR = Basilar Artery   ACA = Anterior Cerebral Artery     SIPH = Carotid Siphon PCA = Posterior Cerebral Artery   VERT = Verterbral Artery                   Normal MCA = 62+\-12 ACA = 50+\-12 PCA = 42+\-23    Abram Sander 01/11/2020 11:20 AM

## 2020-01-12 DIAGNOSIS — I610 Nontraumatic intracerebral hemorrhage in hemisphere, subcortical: Secondary | ICD-10-CM

## 2020-01-12 LAB — CBC WITH DIFFERENTIAL/PLATELET
Abs Immature Granulocytes: 0.06 10*3/uL (ref 0.00–0.07)
Basophils Absolute: 0 10*3/uL (ref 0.0–0.1)
Basophils Relative: 0 %
Eosinophils Absolute: 0.1 10*3/uL (ref 0.0–0.5)
Eosinophils Relative: 1 %
HCT: 37.5 % (ref 36.0–46.0)
Hemoglobin: 11.9 g/dL — ABNORMAL LOW (ref 12.0–15.0)
Immature Granulocytes: 1 %
Lymphocytes Relative: 10 %
Lymphs Abs: 1 10*3/uL (ref 0.7–4.0)
MCH: 30.3 pg (ref 26.0–34.0)
MCHC: 31.7 g/dL (ref 30.0–36.0)
MCV: 95.4 fL (ref 80.0–100.0)
Monocytes Absolute: 0.7 10*3/uL (ref 0.1–1.0)
Monocytes Relative: 7 %
Neutro Abs: 8.1 10*3/uL — ABNORMAL HIGH (ref 1.7–7.7)
Neutrophils Relative %: 81 %
Platelets: 326 10*3/uL (ref 150–400)
RBC: 3.93 MIL/uL (ref 3.87–5.11)
RDW: 13 % (ref 11.5–15.5)
WBC: 9.9 10*3/uL (ref 4.0–10.5)
nRBC: 0 % (ref 0.0–0.2)

## 2020-01-12 LAB — GLUCOSE, CAPILLARY
Glucose-Capillary: 185 mg/dL — ABNORMAL HIGH (ref 70–99)
Glucose-Capillary: 164 mg/dL — ABNORMAL HIGH (ref 70–99)
Glucose-Capillary: 144 mg/dL — ABNORMAL HIGH (ref 70–99)
Glucose-Capillary: 156 mg/dL — ABNORMAL HIGH (ref 70–99)
Glucose-Capillary: 122 mg/dL — ABNORMAL HIGH (ref 70–99)
Glucose-Capillary: 145 mg/dL — ABNORMAL HIGH (ref 70–99)
Glucose-Capillary: 132 mg/dL — ABNORMAL HIGH (ref 70–99)

## 2020-01-12 MED ORDER — HEPARIN SODIUM (PORCINE) 5000 UNIT/ML IJ SOLN
5000.0000 [IU] | Freq: Three times a day (TID) | INTRAMUSCULAR | Status: DC
  Administered 2020-01-12 – 2020-01-20 (×26): 5000 [IU] via SUBCUTANEOUS
  Filled 2020-01-12 (×26): qty 1

## 2020-01-12 MED ORDER — SODIUM CHLORIDE 0.9 % IV SOLN: INTRAVENOUS | Status: DC

## 2020-01-12 NOTE — Progress Notes (Signed)
Inpatient Rehabilitation Admissions Coordinator  I will follow patient's progress to assist with planning rehab venue and insurance authorization when appropriate. I will meet with family tomorrow.  Danne Baxter, RN, MSN Rehab Admissions Coordinator [phone] 01/12/2020 3:15 PM

## 2020-01-12 NOTE — Consult Note (Signed)
Physical Medicine and Rehabilitation Consult Reason for Consult: Left side weakness Referring Physician: Dr. Kathyrn Sheriff   HPI: Erika Stanton is a 64 y.o. right-handed female on no prescription medications.  Per chart review patient lives with spouse.  Independent prior to admission.  Presented 01/05/2020 with right-sided headache and left-sided weakness.  CT/MRI showed parenchymal hemorrhage centered at the right basal ganglia adjacent white matter with sulcal subarachnoid intraventricular extension and mild regional mass-effect.  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  Admission chemistries potassium 3.3, calcium 1.10, SARS coronavirus negative, alcohol negative.  EEG negative for seizure.  Patient underwent diagnostic cerebral angiogram with successful stent supported coil embolization of large R ICA aneurysm.  Currently maintained on aspirin and Plavix therapy.  Keppra for seizure prophylaxis.  Patient was cleared to begin subcutaneous heparin for DVT prophylaxis.  Currently n.p.o. with alternative means of nutritional support.  Therapy evaluation completed with recommendations of physical medicine rehab consult.   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  Said she's asking frequently, when occ awake, for water.   Constantly moving R>L legs. Got ROS from husband   Review of Systems  Unable to perform ROS: Acuity of condition  Constitutional: Negative for chills and fever.  HENT: Negative for hearing loss.   Eyes: Negative for blurred vision and double vision.  Respiratory: Negative for cough and shortness of breath.   Cardiovascular: Negative for chest pain and leg swelling.  Gastrointestinal: Positive for constipation. Negative for heartburn, nausea and vomiting.  Genitourinary: Negative for dysuria, flank pain  and hematuria.  Musculoskeletal: Positive for myalgias.  Skin: Negative for rash.  Neurological: Positive for weakness and headaches.  All other systems reviewed and are negative.  History reviewed. No pertinent past medical history. Past Surgical History:  Procedure Laterality Date  . IR 3D INDEPENDENT WKST  01/06/2020  . IR ANGIO INTRA EXTRACRAN SEL INTERNAL CAROTID BILAT MOD SED  01/06/2020  . IR ANGIO VERTEBRAL SEL VERTEBRAL UNI L MOD SED  01/06/2020  . IR ANGIOGRAM FOLLOW UP STUDY  01/06/2020  . IR ANGIOGRAM FOLLOW UP STUDY  01/06/2020  . IR ANGIOGRAM FOLLOW UP STUDY  01/06/2020  . IR ANGIOGRAM FOLLOW UP STUDY  01/06/2020  . IR ANGIOGRAM FOLLOW UP STUDY  01/06/2020  . IR ANGIOGRAM FOLLOW UP STUDY  01/06/2020  . IR ANGIOGRAM FOLLOW UP STUDY  01/06/2020  . IR ANGIOGRAM FOLLOW UP STUDY  01/06/2020  . IR ANGIOGRAM FOLLOW UP STUDY  01/06/2020  . IR ANGIOGRAM FOLLOW UP STUDY  01/06/2020  . IR NEURO EACH ADD'L AFTER BASIC UNI RIGHT (MS)  01/06/2020  . IR TRANSCATH/EMBOLIZ  01/06/2020  . RADIOLOGY WITH ANESTHESIA N/A 01/06/2020   Procedure: IR WITH ANESTHESIA FOR ANEURYSM;  Surgeon: Consuella Lose, MD;  Location: [facility];  Service: Radiology;  Laterality: N/A;   History reviewed. No pertinent family history. Social History:  has no history on file for tobacco use, alcohol use, and drug use. Allergies:  Allergies  Allergen Reactions  . Sulfa Antibiotics Rash   No medications prior to admission.    Home: Home Living Family/patient expects to be discharged to:: Private residence Living Arrangements: Spouse/significant other Available Help at Discharge: Family (unsure on availability) Type of Home:  (unable to determine) Additional Comments: pt unable to report history 2/2  lethargy and impaired communication  Functional History: Prior Function Level of Independence: Independent Comments: per chart review Functional Status:  Mobility: Bed Mobility Overal bed mobility: Needs  Assistance Bed Mobility: Rolling, Sidelying to Sit, Sit to Supine Rolling: Total assist, +2 for physical assistance Sidelying to sit: Total assist, +2 for physical assistance Supine to sit: Total assist, +2 for physical assistance Sit to supine: Total assist, +2 for physical assistance General bed mobility comments: pt with no active assist or initiation of task, dependent for trunk elevation and LE management Transfers Overall transfer level: Needs assistance Equipment used: 2 person hand held assist Transfers: Sit to/from Stand Sit to Stand: Max assist, +2 physical assistance General transfer comment: not tested Ambulation/Gait General Gait Details: unable at this time    ADL: ADL Overall ADL's : Needs assistance/impaired Eating/Feeding: NPO General ADL Comments: Pt requires total A for all aspects.  unable to assist at this time   Cognition: Cognition Overall Cognitive Status: Difficult to assess Orientation Level: Oriented to person, Oriented to place, Oriented to time, Disoriented to situation Cognition Arousal/Alertness: Lethargic Behavior During Therapy: Flat affect Overall Cognitive Status: Difficult to assess Area of Impairment: Orientation, Following commands, Problem solving Orientation Level: Disoriented to, Place, Time, Situation (pt stated her name) Current Attention Level: Focused Following Commands: Follows one step commands with increased time, Follows one step commands inconsistently Problem Solving: Slow processing, Decreased initiation, Difficulty sequencing, Requires verbal cues, Requires tactile cues General Comments: pt more verbal today and maintained eyes half open t/o session. pt with improved command follow and was able to complete 10 reps of LE exercises but not with UEs and did not initiate any transfers Difficult to assess due to: Level of arousal  Blood pressure 139/78, pulse 76, temperature 98.3 F (36.8 C), temperature source Axillary, resp.  rate 18, height 5\' 6"  (1.676 m), weight 82.3 kg, SpO2 97 %. Physical Exam Vitals and nursing note reviewed. Exam conducted with a chaperone present.  Constitutional:      Comments: Pt asleep, couldn't get to wake up fully, however constantly moving R>L legs- rolling back and forth; Husband on R side of bed, NAD  HENT:     Head: Normocephalic.     Comments: Has cortrak in place- couldn't /due to lethargy comply with facial commands    Right Ear: External ear normal.     Left Ear: External ear normal.     Nose: Nose normal. No congestion.     Mouth/Throat:     Mouth: Mucous membranes are dry.     Pharynx: Oropharynx is clear. No oropharyngeal exudate.  Eyes:     Comments: Eye closed the entire time- wouldn't/couldn't open eyes  Cardiovascular:     Comments: RRR- no M/R/G Pulmonary:     Comments: Coarse breath sounds- mainly upper airway sounds- good air movement B/L Abdominal:     Comments: Soft, NT; ND, (+)normoactive BS  Musculoskeletal:     Cervical back: Normal range of motion and neck supple.     Comments: Couldn't comply with all commands Squeezed hand on L- wasn't severely weaker Couldn't squeeze on R constantly moving B/L R>L legs back and forth-  Can wiggle toes on L and R feet  Skin:    Comments: IVs in R and L forearms- look good   Neurological:     Comments: Patient is lethargic.  Decreased attention.  Difficult to keep awake during exam.  Minimal following commands.  Minimal verbal output mostly moaning.  No speech, verbal output  when I was in room- husband at bedside; kept eyes closed/asleep entire time- couldn't comply with majority of commands  Mild tone in R hand/wrist which appears to be earlier than expected  Psychiatric:     Comments: asleep     Results for orders placed or performed during the hospital encounter of 01/05/20 (from the past 24 hour(s))  Glucose, capillary     Status: Abnormal   Collection Time: 01/11/20  3:38 PM  Result Value Ref Range    Glucose-Capillary 145 (H) 70 - 99 mg/dL  Glucose, capillary     Status: Abnormal   Collection Time: 01/11/20  7:55 PM  Result Value Ref Range   Glucose-Capillary 120 (H) 70 - 99 mg/dL  CBC with Differential/Platelet     Status: Abnormal   Collection Time: 01/12/20  4:31 AM  Result Value Ref Range   WBC 9.9 4.0 - 10.5 K/uL   RBC 3.93 3.87 - 5.11 MIL/uL   Hemoglobin 11.9 (L) 12.0 - 15.0 g/dL   HCT 37.5 36 - 46 %   MCV 95.4 80.0 - 100.0 fL   MCH 30.3 26.0 - 34.0 pg   MCHC 31.7 30.0 - 36.0 g/dL   RDW 13.0 11.5 - 15.5 %   Platelets 326 150 - 400 K/uL   nRBC 0.0 0.0 - 0.2 %   Neutrophils Relative % 81 %   Neutro Abs 8.1 (H) 1.7 - 7.7 K/uL   Lymphocytes Relative 10 %   Lymphs Abs 1.0 0.7 - 4.0 K/uL   Monocytes Relative 7 %   Monocytes Absolute 0.7 0 - 1 K/uL   Eosinophils Relative 1 %   Eosinophils Absolute 0.1 0 - 0 K/uL   Basophils Relative 0 %   Basophils Absolute 0.0 0 - 0 K/uL   Immature Granulocytes 1 %   Abs Immature Granulocytes 0.06 0.00 - 0.07 K/uL  Glucose, capillary     Status: Abnormal   Collection Time: 01/12/20  7:58 AM  Result Value Ref Range   Glucose-Capillary 185 (H) 70 - 99 mg/dL  Glucose, capillary     Status: Abnormal   Collection Time: 01/12/20 12:03 PM  Result Value Ref Range   Glucose-Capillary 164 (H) 70 - 99 mg/dL   VAS [place] TRANSCRANIAL DOPPLER  Result Date: 01/11/2020  Transcranial Doppler Indications: Subarachnoid hemorrhage. Comparison Study: previous 8/26 Performing Technologist: Abram Sander RVS  Examination Guidelines: A complete evaluation includes B-mode imaging, spectral Doppler, color Doppler, and power Doppler as needed of all accessible portions of each vessel. Bilateral testing is considered an integral part of a complete examination. Limited examinations for reoccurring indications may be performed as noted.  +----------+-------------+----------+-----------+-------+ RIGHT TCD Right VM (cm)Depth (cm)PulsatilityComment  +----------+-------------+----------+-----------+-------+ MCA           57.00                 1.15            +----------+-------------+----------+-----------+-------+ ACA          -30.00                 1.05            +----------+-------------+----------+-----------+-------+ Term ICA      45.00                 1.12            +----------+-------------+----------+-----------+-------+ PCA           20.00  1.43            +----------+-------------+----------+-----------+-------+ Opthalmic     18.00                 1.68            +----------+-------------+----------+-----------+-------+ ICA siphon    40.00                 1.45            +----------+-------------+----------+-----------+-------+ Vertebral    -20.00                 1.23            +----------+-------------+----------+-----------+-------+  +----------+------------+----------+-----------+-------+ LEFT TCD  Left VM (cm)Depth (cm)PulsatilityComment +----------+------------+----------+-----------+-------+ MCA          66.00                 1.07            +----------+------------+----------+-----------+-------+ ACA          -25.00                1.18            +----------+------------+----------+-----------+-------+ Term ICA     35.00                 0.93            +----------+------------+----------+-----------+-------+ PCA          43.00                 0.87            +----------+------------+----------+-----------+-------+ Opthalmic    25.00                 1.41            +----------+------------+----------+-----------+-------+ ICA siphon   40.00                 1.36            +----------+------------+----------+-----------+-------+ Vertebral    -41.00                1.08            +----------+------------+----------+-----------+-------+  +------------+-------+-------+             VM cm/sComment +------------+-------+-------+ Prox  Basilar-32.00         +------------+-------+-------+ Summary: This was a normal transcranial Doppler study, with normal flow direction and velocity of all identified vessels of the anterior and posterior circulations, with no evidence of stenosis, vasospasm or occlusion. There was no evidence of intracranial disease.  *See table(s) above for TCD measurements and observations.  Diagnosing physician: Antony Contras MD Electronically signed by Antony Contras MD on 01/11/2020 at 1:41:20 PM.    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 B/L weakness? Was requiring restraints- has been stopped for now.  1. Does the need for close, 24 hr/day medical supervision in concert with the patient's rehab needs make it unreasonable for this patient to be served in a less intensive setting? Yes 2. Co-Morbidities requiring supervision/potential complications: dysphagia, impaired cognition; aphasia, B/L weakness, early tone in RUE 3. Due to bladder management, bowel management, safety, skin/wound care, disease management, medication administration, pain management and patient education, does the patient require 24 hr/day  rehab nursing? Yes 4. Does the patient require coordinated care of a physician, rehab nurse, therapy disciplines of PT, OT and SLP to address physical and functional deficits in the context of the above medical diagnosis(es)? Yes Addressing deficits in the following areas: balance, endurance, locomotion, strength, transferring, bowel/bladder control, bathing, dressing, feeding, grooming, toileting, cognition, speech, language and swallowing 5. Can the patient actively participate in an intensive therapy program of at least 3 hrs of therapy per day at least 5 days per week? Yes 6. The potential for patient to make measurable gains  while on inpatient rehab is good 7. Anticipated functional outcomes upon discharge from inpatient rehab are min assist  with PT, min assist with OT, min assist with SLP. 8. Estimated rehab length of stay to reach the above functional goals is: ~ 3 weeks- hard to tell at this point 9. Anticipated discharge destination: Home 10. Overall Rehab/Functional Prognosis: good  RECOMMENDATIONS: This patient's condition is appropriate for continued rehabilitative care in the following setting: CIR Patient has agreed to participate in recommended program. Potentially Note that insurance prior authorization may be required for reimbursement for recommended care.  Comment:  1. Signed FMLA paperwork that husband was asking about to save primary physician from having to- FYI 2. If BP would allow and primary team ok, pt might benefit from low dose Ritalin 2.5-5 mg BID at 8am/noon could help with waking pt up and attention- might be appropriate in the next week or so. Or, could try Amantadine liquid 100 mg daily x 4 days then 200 mg daily- to help with initiation.  3. Not quite ready since in ICU as well as pt not awake enough to do 3 hours/therapy yet- however will follow via Admissions coordinators to see when appropriate.  4. Thank you for this consult.      Lavon Paganini Angiulli, PA-C 01/12/2020

## 2020-01-12 NOTE — Progress Notes (Addendum)
NAME:  Erika Stanton, MRN:  [id number], DOB:  18-Sep-1955, LOS: 7 ADMISSION DATE:  01/05/2020, CONSULTATION DATE: 01/12/20  REFERRING MD:  Kerney Elbe, MD, CHIEF COMPLAINT:  Right sided headache, weakness   Brief History   Erika Stanton is a 64 y.o. 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 Since presentation, she has underwent placement of a flow diverting stent with IR.   History of present illness   Erika Stanton is a 64 y.o. female with no past medical history. Patient had walked outside then returned to the house complaining of a headache at 9/10 in the morning.  She later notified her husband of left arm and left leg weakness.  EMS was called. On arrival to the ED she was drowsy but complained of right-sided headache and left-sided weakness. She reported her headache was located along her right forehead. A STAT head CT was obtained which showed both intraventricular and intraparenchymal hemorrhage. 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   Past Medical History  None  Significant Hospital Events   8/24 - Admitted  Consults:  Neurosurgery Neurology Interventional Radiology  Procedures:  8/25 - Diagnostic cerebral angiogram, stent-supported coil embolization of RICA aneurysm  Significant Diagnostic Tests:  8/24 - CT Head Code Stroke Wo Contrast: Intra-axial hemorrhage at the right basal ganglia with an estimated volume of 18 mL, plus a small volume of subarachnoid hemorrhage and bilateral intraventricular blood.  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  8/30-TCD: Normal velocities.  No signs of vasospasm.  Micro Data:  None  Antimicrobials:  None  Interim history/subjective:   Some improvement in mental status.  Patient is now following some commands and able to answer basic questions.  Objective   Blood pressure 135/75, pulse 83, temperature 98.2 F (36.8 C), temperature source Axillary, resp. rate (!) 21, height 5\' 6"  (1.676 m), weight 82.3 kg, SpO2 100 %.        Intake/Output Summary (Last 24 hours) at 01/12/2020 1112 Last data filed at 01/12/2020 1100 Gross per 24 hour  Intake 1664.54 ml  Output 2200 ml  Net -535.46 ml   Filed Weights   01/09/20 0500 01/10/20 0500 01/12/20 0400  Weight: 86.5 kg 86.5 kg 82.3 kg    Examination: General: Resting, NG tube in place, well-nourished, no acute distress HENT: Normocephalic, atraumatic, mucus membranes moist, eyes remain closed Lungs: CTAB, no increased work of breathing Cardiovascular: RRR, Normal S1, S2 Abdomen: NABS, nontender, nondistended, no HSM Extremities: Pulses 2+ and symmetric bilaterally Neuro: Somnolent, not following commands eyes closed moans incomprehensibly at times.  Right upper extremity hemiplegia.  Moves both lower extremities symmetrically.  Resolved Hospital Problem list   N/A  Assessment & Plan:   Critically ill due to ruptured Giant Aneurysm (20 mm) causing Acute Intracranial Hemorrhage at R Basal Ganglia now day 7 post hemorrhage, day 6 post coiling. Status post coiling Periprocedural ischemic stroke. 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  Plan:  Allow blood pressure to rise to  144 systolic, continue IV fluids to maintain euvolemia and continue Nimotop to prevent vasospasm. Follow serial exam.  Entering vasospasm window, at high risk for secondary ischemic neurological injury Follow serial TCD's.  Hemodynamic  augmentation if velocities increase or showing signs of clinical vasospasm.   Daily Goals Checklist  Pain/Anxiety/Delirium protocol (if indicated): No sedation Neuro vitals: every 2 hours AED's: On Keppra VAP protocol (if indicated): Not intubated Respiratory support goals: Encourage deep breathing. Blood pressure target: Systolic [phone] DVT prophylaxis: SCDs.  Start chemical full prophylaxis in 72 hours Nutrition Status: Tube feeds at goal GI prophylaxis: Not indicated Fluid status goals: Maintain euvolemia.  Normal saline at 75 an hour Urinary catheter: Assessment of intravascular volume , at risk for cerebral salt wasting. Central lines: Peripheral IVs only Glucose control: Euglycemic sliding scale insulin. Mobility/therapy needs: PT OT following. Antibiotic de-escalation: No antibiotics Home medication reconciliation: On hold Daily labs: CBC, BMP daily Code Status: Full code Family Communication: Husband updated by neurosurgery. Disposition: ICU.   Labs   CBC: Recent Labs  Lab 01/08/20 0509 01/09/20 0553 01/10/20 0545 01/11/20 0503 01/12/20 0431  WBC 14.1* 11.2* 11.2* 12.1* 9.9  NEUTROABS 10.8* 8.1* 8.1* 9.4* 8.1*  HGB 11.3* 11.1* 12.2 12.5 11.9*  HCT 35.6* 35.1* 37.8 38.0 37.5  MCV 97.3 97.5 96.2 95.2 95.4  PLT 267 265 293 327 149    Basic Metabolic Panel: Recent Labs  Lab 01/07/20 0838 01/07/20 1015 01/07/20 1322 01/07/20 1700 01/08/20 0509 01/09/20 0553 01/10/20 0545  NA 140 140  --   --  140 143 137  K 3.1* 3.2*  --   --  3.6 4.0 4.4  CL  --  107  --   --  108 108 103  CO2  --  25  --   --  24 24 23   GLUCOSE  --  134*  --   --  144* 157* 162*  BUN  --  6*  --   --  11 16 18   CREATININE  --  0.47  --   --  0.45 0.49 0.51  CALCIUM  --  8.3*  --   --  8.6* 8.8* 8.9  MG  --   --  2.1 2.1 2.1 2.1  --   PHOS  --   --  2.0* 1.4* 1.2* 2.6  --    GFR: Estimated Creatinine Clearance: 77.8 mL/min (by C-G formula based on SCr of 0.51 mg/dL). Recent Labs    Lab 01/09/20 0553 01/10/20 0545 01/11/20 0503 01/12/20 0431  WBC 11.2* 11.2* 12.1* 9.9    Liver Function Tests: No results for input(s): AST, ALT, ALKPHOS, BILITOT, PROT, ALBUMIN in the last 168 hours. No results for input(s): LIPASE, AMYLASE in the last 168 hours. No results for input(s): AMMONIA in the last 168 hours.  ABG    Component Value Date/Time   PHART 7.460 (H) 01/07/2020 0838   PCO2ART 37.3 01/07/2020 0838   PO2ART 85 01/07/2020 0838   HCO3 26.6 01/07/2020 0838   TCO2 28 01/07/2020 0838   O2SAT 97.0 01/07/2020 0838     Coagulation Profile: Recent Labs  Lab 01/05/20 1211  INR 1.0    Cardiac Enzymes: No results for input(s): CKTOTAL, CKMB, CKMBINDEX, TROPONINI in the last 168 hours.  HbA1C: Hgb A1c MFr Bld  Date/Time Value Ref Range Status  01/05/2020 12:11 PM 6.0 (H) 4.8 - 5.6 % Final    Comment:    (NOTE)         Prediabetes: 5.7 -  6.4         Diabetes: >6.4         Glycemic control for adults with diabetes: <7.0     CBG: Recent Labs  Lab 01/11/20 0744 01/11/20 1152 01/11/20 1538 01/11/20 1955 01/12/20 0758  GLUCAP 137* 172* 145* 120* 185*   CRITICAL CARE Performed by: Kipp Brood   Total critical care time: 35 minutes  Critical care time was exclusive of separately billable procedures and treating other patients.  Critical care was necessary to treat or prevent imminent or life-threatening deterioration.  Critical care was time spent personally by me on the following activities: development of treatment plan with patient and/or surrogate as well as nursing, discussions with consultants, evaluation of patient's response to treatment, examination of patient, obtaining history from patient or surrogate, ordering and performing treatments and interventions, ordering and review of laboratory studies, ordering and review of radiographic studies, pulse oximetry, re-evaluation of patient's condition and participation in multidisciplinary  rounds.  Kipp Brood, MD [facility] ICU Physician [place]  Pager: [phone] Mobile: [phone] After hours: [phone].

## 2020-01-12 NOTE — Progress Notes (Addendum)
  NEUROSURGERY PROGRESS NOTE   No issues overnight.   EXAM:  BP 127/62 (BP Location: Right Arm)   Pulse 88   Temp 99.7 F (37.6 C) (Axillary)   Resp 17   Ht 5\' 6"  (1.676 m)   Wt 82.3 kg   SpO2 98%   BMI 29.28 kg/m   Drowsy, but easily awakened Opens eyes to voice, PERRL Mumbled speech, but is oriented to self and year Follows commands briskly with LUE, BLE No movement RUE Date POD PCO2 HCT BP  MCA ACA PCA OPHT SIPH VERT Basilar  8/26 MR     Right  Left   47  52   -34  *   *  19   20  20    42  28   *  *   *      8/27 MS     Right  Left   25  *   *  *   *  *   20  19   33  27   *  *   *      8/30 MR     Right  Left   57  66   -30  -25   20  43   17  25   39  40   -46  -57   -5         IMPRESSION/PLAN 64 y.o. female SAH d#7 POD#6 stent-coil of large RICA terminus aneurysm. Improving. - continue supportive care, keppra, nimotop - TCDs limited window, but without evidence of vasospasm. Continue M/W/Fr - appreciate PCCM management of patient  ADDENDUM:  Agree with above  Please do not hesitate to call with questions or concerns.   Elwin Sleight, [facility] Cell: [phone]

## 2020-01-12 NOTE — Progress Notes (Signed)
Inpatient Rehab Admissions Coordinator:   Pt was screened for CIR appropriateness by Shann Medal, PT, DPT.  At this time we are recommending a CIR consult and I will place an order per our protocol.  Please contact me with questions.    Shann Medal, PT, DPT Admissions Coordinator [phone] 01/12/20  12:41 PM

## 2020-01-12 NOTE — Progress Notes (Signed)
Physical Therapy Treatment Patient Details Name: Erika Stanton MRN: [id number] DOB: [birth year] Today's Date: 01/12/2020    History of Present Illness 64 y.o. female with no past medical history.  Per husband patient had walked outside then came in to the house complaining of a headache at 9 10 in the morning.  She then had gone into the shower and called for husband due to left arm and left leg weakness. 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 Pt underwent coil embolization of RICA aneurysm on 8/25 with additional finding of LICA aneurysm.    PT Comments    Pt more alert today and was able to maintain eyes 1/2 way open for session, eyes with upward gaze. Pt did attempted to verbalize several times, ie."lay down" and her name. Pt with improved attention span and was able to complete 10 reps of LE there ex at EOB. Pt continues to require total assist for mobility. Pt with strong L lateral lean in sitting and unable to self correct without assist. Pt able to move bilat LEs to command but unable to move R UE at all due to flacidity, and minimal active movement of L UE. Acute PT to cont to follow to progress mobility.   Follow Up Recommendations  CIR     Equipment Recommendations  Wheelchair (measurements PT);Wheelchair cushion (measurements PT);Hospital bed    Recommendations for Other Services Rehab consult     Precautions / Restrictions Precautions Precautions: Fall Restrictions Weight Bearing Restrictions: No    Mobility  Bed Mobility Overal bed mobility: Needs Assistance Bed Mobility: Rolling;Sidelying to Sit;Sit to Supine Rolling: Total assist;+2 for physical assistance Sidelying to sit: Total assist;+2 for physical assistance   Sit to supine: Total assist;+2 for physical assistance   General bed mobility comments: pt with no active assist or initiation of task, dependent for trunk elevation and LE management  Transfers                 General  transfer comment: not tested  Ambulation/Gait             General Gait Details: unable at this time   Stairs             Wheelchair Mobility    Modified Rankin (Stroke Patients Only)       Balance Overall balance assessment: Needs assistance Sitting-balance support: No upper extremity supported;Feet supported Sitting balance-Leahy Scale: Zero Sitting balance - Comments: pt unable to use R UE at all to support self, pt not using L UE either despite manual muscle stimulation, pt with one effort to push self up toward midline with L UE Postural control: Left lateral lean                                  Cognition Arousal/Alertness: Lethargic Behavior During Therapy: Flat affect Overall Cognitive Status: Difficult to assess Area of Impairment: Orientation;Following commands;Problem solving                 Orientation Level: Disoriented to;Place;Time;Situation (pt stated her name) Current Attention Level: Focused   Following Commands: Follows one step commands with increased time;Follows one step commands inconsistently     Problem Solving: Slow processing;Decreased initiation;Difficulty sequencing;Requires verbal cues;Requires tactile cues General Comments: pt more verbal today and maintained eyes half open t/o session. pt with improved command follow and was able to complete 10 reps of LE exercises but not with  UEs and did not initiate any transfers      Exercises General Exercises - Lower Extremity Long Arc Quad: AROM;Both;10 reps;Seated Hip Flexion/Marching: AROM;Both;Seated (L required AA, appears to be weaker) Other Exercises Other Exercises: passive ROM to bilat UEs at shoulder and elbow    General Comments General comments (skin integrity, edema, etc.): VSS      Pertinent Vitals/Pain Pain Assessment: Faces Faces Pain Scale: No hurt    Home Living                      Prior Function            PT Goals (current  goals can now be found in the care plan section) Progress towards PT goals: Progressing toward goals    Frequency    Min 3X/week      PT Plan Current plan remains appropriate    Co-evaluation              AM-PAC PT "6 Clicks" Mobility   Outcome Measure  Help needed turning from your back to your side while in a flat bed without using bedrails?: Total Help needed moving from lying on your back to sitting on the side of a flat bed without using bedrails?: Total Help needed moving to and from a bed to a chair (including a wheelchair)?: Total Help needed standing up from a chair using your arms (e.g., wheelchair or bedside chair)?: Total Help needed to walk in hospital room?: Total Help needed climbing 3-5 steps with a railing? : Total 6 Click Score: 6    End of Session   Activity Tolerance: Patient tolerated treatment well Patient left: in bed;with call bell/phone within reach;with bed alarm set;with nursing/sitter in room Nurse Communication: Mobility status;Need for lift equipment PT Visit Diagnosis: Other abnormalities of gait and mobility (R26.89);Muscle weakness (generalized) (M62.81);Other symptoms and signs involving the nervous system (R29.898);Pain     Time: 1014-1030 PT Time Calculation (min) (ACUTE ONLY): 16 min  Charges:  $Therapeutic Exercise: 8-22 mins                     Kittie Plater, PT, DPT Acute Rehabilitation Services Pager #: [phone] Office #: [phone]    Berline Lopes 01/12/2020, 11:26 AM

## 2020-01-13 ENCOUNTER — Inpatient Hospital Stay (HOSPITAL_BASED_OUTPATIENT_CLINIC_OR_DEPARTMENT_OTHER): Payer: BC Managed Care – PPO

## 2020-01-13 DIAGNOSIS — R569 Unspecified convulsions: Secondary | ICD-10-CM | POA: Diagnosis not present

## 2020-01-13 LAB — GLUCOSE, CAPILLARY
Glucose-Capillary: 156 mg/dL — ABNORMAL HIGH (ref 70–99)
Glucose-Capillary: 144 mg/dL — ABNORMAL HIGH (ref 70–99)
Glucose-Capillary: 146 mg/dL — ABNORMAL HIGH (ref 70–99)
Glucose-Capillary: 152 mg/dL — ABNORMAL HIGH (ref 70–99)
Glucose-Capillary: 117 mg/dL — ABNORMAL HIGH (ref 70–99)
Glucose-Capillary: 131 mg/dL — ABNORMAL HIGH (ref 70–99)

## 2020-01-13 LAB — BASIC METABOLIC PANEL
Anion gap: 12 (ref 5–15)
BUN: 27 mg/dL — ABNORMAL HIGH (ref 8–23)
CO2: 22 mmol/L (ref 22–32)
Calcium: 8.6 mg/dL — ABNORMAL LOW (ref 8.9–10.3)
Chloride: 105 mmol/L (ref 98–111)
Creatinine, Ser: 0.55 mg/dL (ref 0.44–1.00)
GFR calc Af Amer: >60 mL/min (ref >60)
GFR calc non Af Amer: >60 mL/min (ref >60)
Glucose, Bld: 179 mg/dL — ABNORMAL HIGH (ref 70–99)
Potassium: 3.9 mmol/L (ref 3.5–5.1)
Sodium: 139 mmol/L (ref 135–145)

## 2020-01-13 LAB — CBC WITH DIFFERENTIAL/PLATELET
Abs Immature Granulocytes: 0.06 10*3/uL (ref 0.00–0.07)
Basophils Absolute: 0 10*3/uL (ref 0.0–0.1)
Basophils Relative: 0 %
Eosinophils Absolute: 0.1 10*3/uL (ref 0.0–0.5)
Eosinophils Relative: 1 %
HCT: 35.9 % — ABNORMAL LOW (ref 36.0–46.0)
Hemoglobin: 11.4 g/dL — ABNORMAL LOW (ref 12.0–15.0)
Immature Granulocytes: 1 %
Lymphocytes Relative: 14 %
Lymphs Abs: 1.2 10*3/uL (ref 0.7–4.0)
MCH: 30.6 pg (ref 26.0–34.0)
MCHC: 31.8 g/dL (ref 30.0–36.0)
MCV: 96.5 fL (ref 80.0–100.0)
Monocytes Absolute: 0.7 10*3/uL (ref 0.1–1.0)
Monocytes Relative: 8 %
Neutro Abs: 6.6 10*3/uL (ref 1.7–7.7)
Neutrophils Relative %: 76 %
Platelets: 350 10*3/uL (ref 150–400)
RBC: 3.72 MIL/uL — ABNORMAL LOW (ref 3.87–5.11)
RDW: 12.9 % (ref 11.5–15.5)
WBC: 8.7 10*3/uL (ref 4.0–10.5)
nRBC: 0 % (ref 0.0–0.2)

## 2020-01-13 NOTE — Evaluation (Signed)
Speech Language Pathology Evaluation Patient Details Name: GERALD HONEA MRN: [id number] DOB: March 05, 1956 Today's Date: 01/13/2020 Time: 7829-5621 SLP Time Calculation (min) (ACUTE ONLY): 14 min  Problem List:  Patient Active Problem List   Diagnosis Date Noted  . ICH (intracerebral hemorrhage) ([place]) 01/05/2020   Past Medical History: History reviewed. No pertinent past medical history. Past Surgical History:  Past Surgical History:  Procedure Laterality Date  . IR 3D INDEPENDENT WKST  01/06/2020  . IR ANGIO INTRA EXTRACRAN SEL INTERNAL CAROTID BILAT MOD SED  01/06/2020  . IR ANGIO VERTEBRAL SEL VERTEBRAL UNI L MOD SED  01/06/2020  . IR ANGIOGRAM FOLLOW UP STUDY  01/06/2020  . IR ANGIOGRAM FOLLOW UP STUDY  01/06/2020  . IR ANGIOGRAM FOLLOW UP STUDY  01/06/2020  . IR ANGIOGRAM FOLLOW UP STUDY  01/06/2020  . IR ANGIOGRAM FOLLOW UP STUDY  01/06/2020  . IR ANGIOGRAM FOLLOW UP STUDY  01/06/2020  . IR ANGIOGRAM FOLLOW UP STUDY  01/06/2020  . IR ANGIOGRAM FOLLOW UP STUDY  01/06/2020  . IR ANGIOGRAM FOLLOW UP STUDY  01/06/2020  . IR ANGIOGRAM FOLLOW UP STUDY  01/06/2020  . IR NEURO EACH ADD'L AFTER BASIC UNI RIGHT (MS)  01/06/2020  . IR TRANSCATH/EMBOLIZ  01/06/2020  . RADIOLOGY WITH ANESTHESIA N/A 01/06/2020   Procedure: IR WITH ANESTHESIA FOR ANEURYSM;  Surgeon: Consuella Lose, MD;  Location: [facility];  Service: Radiology;  Laterality: N/A;   HPI:  Pt is a 64 yo female presenting with acute headache and L hemiplegia. CT Head was positive for acute ICH at the R basal ganglia with small volume of SAH and IVH and trace leftward midline shift. CTA revealed a large ruptured R ICA terminus aneurysm and smaller unruptured left ICA terminus aneurysm. No known PMH.    Assessment / Plan / Recommendation Clinical Impression  Pt is showing gradual improvements in level of arousal and interaction, although she still keeps her eyes closed throughout sessions. Today she followed several one-step commands using  oral motor skills and BLE. She seemed to respond the majority of the time to yes/no questions, although her responses cannot be deciphered to assess for accuracy. She vocalize back to SLP but minimally moves her lips (and seemingly her tongue) to shape her speech sounds. Intelligibility is significantly impaired even at the word level and within context. Her husband, who would be a more familiar listener, thought he heard her say "husband" when asked who he was. Pt will need ongoing cognitive and communicative therapy.     SLP Assessment  SLP Recommendation/Assessment: Patient needs continued Speech Lanaguage Pathology Services SLP Visit Diagnosis: Cognitive communication deficit (R41.841)    Follow Up Recommendations  Inpatient Rehab    Frequency and Duration min 2x/week  2 weeks      SLP Evaluation Cognition  Overall Cognitive Status: Impaired/Different from baseline Arousal/Alertness: Lethargic Attention: Focused Focused Attention: Impaired Focused Attention Impairment: Verbal basic Problem Solving: Impaired Problem Solving Impairment: Functional basic Safety/Judgment: Impaired       Comprehension  Auditory Comprehension Overall Auditory Comprehension: Impaired Yes/No Questions: Impaired Basic Biographical Questions:  (difficult to assess - see clinical impressions) Commands: Impaired One Step Basic Commands: 50-74% accurate    Expression Expression Primary Mode of Expression: Verbal Verbal Expression Overall Verbal Expression:  (needs further assessment)   Oral / Motor  Motor Speech Overall Motor Speech: Impaired Respiration: Impaired Level of Impairment: Word Phonation: Low vocal intensity Articulation: Impaired Level of Impairment: Word Intelligibility: Intelligibility reduced Word: 0-24% accurate  GO                    Osie Bond., M.A. [place] Acute Rehabilitation Services Pager [phone] Office [phone]  01/13/2020, 1:17 PM

## 2020-01-13 NOTE — Progress Notes (Signed)
Transcranial Doppler  Date POD PCO2 HCT BP  MCA ACA PCA OPHT SIPH VERT Basilar  8/26 MR     Right  Left   47  52   -34  *   *  19   20  20    42  28   *  *   *      8/27 MS     Right  Left   25  *   *  *   *  *   20  19   33  27   *  *   *      8/30 MR     Right  Left   57  66   -30  -25   20  43   18  25   40  40   -20  -41   -32      9/1 GC      Right  Left   59  74   -49  *   23  *   19  19   43  65   -26  -15   *            Right  Left                                            Right  Left                                            Right  Left                                        MCA = Middle Cerebral Artery      OPHT = Opthalmic Artery     BASILAR = Basilar Artery   ACA = Anterior Cerebral Artery     SIPH = Carotid Siphon PCA = Posterior Cerebral Artery   VERT = Verterbral Artery                   Normal MCA = 62+\-12 ACA = 50+\-12 PCA = 42+\-23  * Unable to insonate.  01/08/20- study was very technically limited due to excessive patient movement. 01/13/20 - Study was technically limited due to patient positioning and movement.  01/13/20 10:18 AM Carlos Levering RVT

## 2020-01-13 NOTE — Progress Notes (Signed)
  Speech Language Pathology Treatment: Dysphagia  Patient Details Name: Erika Stanton MRN: [id number] DOB: [birth year] Today's Date: 01/13/2020 Time: 1050-1105 SLP Time Calculation (min) (ACUTE ONLY): 15 min  Assessment / Plan / Recommendation Clinical Impression  Pt still keeps her eyes closed, but is a little more interactive during therapy today. She followed a few oral motor commands (open mouth, stick out tongue), and opens her mouth partially to accept ice chips. She needs assist from SLP in getting the ice chips in her mouth, using her tongue to also try to help, and there is a small amount of anterior loss on the R side, but she quickly starts masticating them. Mod cues are needed throughout intake for sustained attention to oral preparation. A swallow is consistently elicited, with coughing only observed several minutes after trials as she is partially reclined.Pt's husband arrived partway through the session and was updated on current level of function and POC. Will continue to provide PO trials in SLP sessions only.    HPI HPI: Pt is a 64 yo female presenting with acute headache and L hemiplegia. CT Head was positive for acute ICH at the R basal ganglia with small volume of SAH and IVH and trace leftward midline shift. CTA revealed a large ruptured R ICA terminus aneurysm and smaller unruptured left ICA terminus aneurysm. No known PMH.       SLP Plan  Continue with current plan of care       Recommendations  Diet recommendations: NPO Medication Administration: Via alternative means                Oral Care Recommendations: Oral care QID Follow up Recommendations: Inpatient Rehab SLP Visit Diagnosis: Dysphagia, unspecified (R13.10) Plan: Continue with current plan of care       GO               Osie Bond., M.A. [place] Acute Rehabilitation Services Pager [phone] Office [phone]  01/13/2020, 1:10 PM

## 2020-01-13 NOTE — Progress Notes (Signed)
Inpatient Rehabilitation Admissions Coordinator  I met at bedside with patient's spouse. We discussed goals and expectations of a possible CIR admit pending her ability to participate more fully with therapy. I await her demonstration of ability to participate before proceeding with insurance authorization.  Danne Baxter, RN, MSN Rehab Admissions Coordinator [phone] 01/13/2020 12:16 PM

## 2020-01-13 NOTE — Progress Notes (Signed)
NAME:  Erika Stanton, MRN:  [id number], DOB:  1955-06-04, LOS: 8 ADMISSION DATE:  01/05/2020, CONSULTATION DATE: 01/13/20  REFERRING MD:  Kerney Elbe, MD, CHIEF COMPLAINT:  Right sided headache, weakness   Brief History   Erika Stanton is a 64 y.o. 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 Since presentation, she has underwent placement of a flow diverting stent with IR.   History of present illness   Erika Stanton is a 64 y.o. female with no past medical history. Patient had walked outside then returned to the house complaining of a headache at 9/10 in the morning.  She later notified her husband of left arm and left leg weakness.  EMS was called. On arrival to the ED she was drowsy but complained of right-sided headache and left-sided weakness. She reported her headache was located along her right forehead. A STAT head CT was obtained which showed both intraventricular and intraparenchymal hemorrhage. 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   Past Medical History  None  Significant Hospital Events   8/24 - Admitted  Consults:  Neurosurgery Neurology Interventional Radiology  Procedures:  8/25 - Diagnostic cerebral angiogram, stent-supported coil embolization of RICA aneurysm  Significant Diagnostic Tests:  8/24 - CT Head Code Stroke Wo Contrast: Intra-axial hemorrhage at the right basal ganglia with an estimated volume of 18 mL, plus a small volume of subarachnoid hemorrhage and bilateral intraventricular blood.  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 8/25 CT head : Stent assisted coiling of a right carotid terminus aneurysm. Surrounding intraparenchymal hemorrhage has not apparently increased. Intraventricular blood appears similar, without hydrocephalus. 8/26 MRI brain: Development of acute cortical and subcortical infarction in the left anterior inferior frontal lobe and left parietal vertex. Consider spasm as a potential etiology. Few other scattered punctate foci of acute infarction within the right frontal and parietal cortical brain consistent with micro embolic infarctions  3/66-YQI: Normal velocities.  No signs of vasospasm.  Micro Data:  None  Antimicrobials:  None  Interim history/subjective:  Exam slowly improving. No acute events overnight.   Objective   Blood pressure (!) 161/75, pulse 92, temperature 99 F (37.2 C), temperature source Axillary, resp. rate 20, height 5\' 6"  (1.676 m), weight 82.2 kg, SpO2 96 %.        Intake/Output Summary (Last 24 hours) at 01/13/2020 1122 Last data filed at 01/13/2020 0900 Gross per 24 hour  Intake 2939.27 ml  Output 2350 ml  Net 589.27 ml   Filed Weights   01/10/20 0500 01/12/20 0400 01/13/20 0409  Weight: 86.5 kg 82.3 kg 82.2 kg    Examination: General: middle aged female in NAD HENT: Normocephalic, atraumatic, mucus membranes moist, eyes remain closed Lungs: Clear, no distress Cardiovascular: RRR, no MRG Abdomen: Soft, non-distended, non-tender. Hyperactive.  Extremities: No acute deformity.  Neuro: Sedate: Follows commands in LUE and LLE. Opens eyes to voice and sticks out tongue. Moving RLE spontaneously.    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 - Allow blood  pressure to rise to 962 systolic - continue IV fluids to maintain euvolemia and continue Nimotop to prevent vasospasm. - Keppra ppx - Follow serial exam.   - TCDs pending, entering spasm window - Tube feeding per protocol - ASA/Plavix per neurosurgery  Hyperglycemia without history of DM - SSI   Daily Goals Checklist  Pain/Anxiety/Delirium protocol (if indicated): No sedation Neuro vitals: every 2 hours AED's: On Keppra VAP protocol (if indicated): Not intubated DVT prophylaxis: SCDs.  Heparin SQ Nutrition Status: Tube feeds at goal GI prophylaxis: Not indicated Central lines: Peripheral IVs only Glucose control: Euglycemic sliding scale insulin. Mobility/therapy needs: PT OT following. Code Status: Full code Family Communication: Husband updated at bedside Disposition: ICU.   Labs   CBC: Recent Labs  Lab 01/09/20 0553 01/10/20 0545 01/11/20 0503 01/12/20 0431 01/13/20 0047  WBC 11.2* 11.2* 12.1* 9.9 8.7  NEUTROABS 8.1* 8.1* 9.4* 8.1* 6.6  HGB 11.1* 12.2 12.5 11.9* 11.4*  HCT 35.1* 37.8 38.0 37.5 35.9*  MCV 97.5 96.2 95.2 95.4 96.5  PLT 265 293 327 326 229    Basic Metabolic Panel: Recent Labs  Lab 01/07/20 1015 01/07/20 1322 01/07/20 1700 01/08/20 0509 01/09/20 0553 01/10/20 0545 01/13/20 0047  NA 140  --   --  140 143 137 139  K 3.2*  --   --  3.6 4.0 4.4 3.9  CL 107  --   --  108 108 103 105  CO2 25  --   --  24 24 23 22   GLUCOSE 134*  --   --  144* 157* 162* 179*  BUN 6*  --   --  11 16 18  27*  CREATININE 0.47  --   --  0.45 0.49 0.51 0.55  CALCIUM 8.3*  --   --  8.6* 8.8* 8.9 8.6*  MG  --  2.1 2.1 2.1 2.1  --   --   PHOS  --  2.0* 1.4* 1.2* 2.6  --   --    GFR: Estimated Creatinine Clearance: 77.8 mL/min (by C-G formula based on SCr of 0.55 mg/dL). Recent Labs  Lab 01/10/20 0545 01/11/20 0503 01/12/20 0431 01/13/20 0047  WBC 11.2* 12.1* 9.9 8.7    Liver Function Tests: No results for input(s): AST, ALT, ALKPHOS, BILITOT, PROT, ALBUMIN  in the last 168 hours. No results for input(s): LIPASE, AMYLASE in the last 168 hours. No results for input(s): AMMONIA in the last 168 hours.  ABG    Component Value Date/Time   PHART 7.460 (H) 01/07/2020 0838   PCO2ART 37.3 01/07/2020 0838   PO2ART 85 01/07/2020 0838   HCO3 26.6 01/07/2020 0838   TCO2 28 01/07/2020 0838   O2SAT 97.0 01/07/2020 0838     Coagulation Profile: No results for input(s): INR, PROTIME in the last 168 hours.  Cardiac Enzymes: No results for input(s): CKTOTAL, CKMB, CKMBINDEX, TROPONINI in the last 168 hours.  HbA1C: Hgb A1c MFr Bld  Date/Time Value Ref Range Status  01/05/2020 12:11 PM 6.0 (H) 4.8 - 5.6 % Final    Comment:    (NOTE)         Prediabetes: 5.7 - 6.4         Diabetes: >6.4         Glycemic control for adults with diabetes: <7.0  CBG: Recent Labs  Lab 01/12/20 1531 01/12/20 1936 01/12/20 2322 01/13/20 0324 01/13/20 0719  GLUCAP 144* 156* 132* 156* 144*    Georgann Housekeeper, AGACNP-BC [place]  See Amion for personal pager PCCM on call pager [phone]  01/13/2020 11:43 AM

## 2020-01-13 NOTE — Progress Notes (Addendum)
  NEUROSURGERY PROGRESS NOTE   No issues overnight.   EXAM:  BP (!) 135/109   Pulse 94   Temp 97.9 F (36.6 C) (Axillary)   Resp (!) 22   Ht 5\' 6"  (1.676 m)   Wt 82.2 kg   SpO2 99%   BMI 29.25 kg/m   Drowsy, easily awakened Oriented to self and year PERRL Follows commands consistently with BLE and LUE No motor response RUE  IMPRESSION/PLAN 64 y.o. female SAH d#[id number]-coil of large RICA terminus aneurysm. Stable neurologically. - continue supportive care, keppra, nimotop - TCDs M/W/Fr - appreciate PCCM management of patient - CIR candidate. If patient remains stable, could conceivably be discharged Saturday pending bed availabilty.     ADDENDUM  Pt s/e agree with above  Please do not hesitate to call with questions or concerns.   Elwin Sleight, [facility] Cell: [phone]

## 2020-01-14 ENCOUNTER — Inpatient Hospital Stay (HOSPITAL_COMMUNITY): Payer: BC Managed Care – PPO

## 2020-01-14 DIAGNOSIS — I671 Cerebral aneurysm, nonruptured: Secondary | ICD-10-CM

## 2020-01-14 LAB — CBC WITH DIFFERENTIAL/PLATELET
Abs Immature Granulocytes: 0.06 10*3/uL (ref 0.00–0.07)
Basophils Absolute: 0 10*3/uL (ref 0.0–0.1)
Basophils Relative: 0 %
Eosinophils Absolute: 0.1 10*3/uL (ref 0.0–0.5)
Eosinophils Relative: 2 %
HCT: 35.7 % — ABNORMAL LOW (ref 36.0–46.0)
Hemoglobin: 11.1 g/dL — ABNORMAL LOW (ref 12.0–15.0)
Immature Granulocytes: 1 %
Lymphocytes Relative: 18 %
Lymphs Abs: 1.3 10*3/uL (ref 0.7–4.0)
MCH: 29.7 pg (ref 26.0–34.0)
MCHC: 31.1 g/dL (ref 30.0–36.0)
MCV: 95.5 fL (ref 80.0–100.0)
Monocytes Absolute: 1 10*3/uL (ref 0.1–1.0)
Monocytes Relative: 14 %
Neutro Abs: 4.6 10*3/uL (ref 1.7–7.7)
Neutrophils Relative %: 65 %
Platelets: 319 10*3/uL (ref 150–400)
RBC: 3.74 MIL/uL — ABNORMAL LOW (ref 3.87–5.11)
RDW: 12.8 % (ref 11.5–15.5)
WBC: 7 10*3/uL (ref 4.0–10.5)
nRBC: 0 % (ref 0.0–0.2)

## 2020-01-14 LAB — BASIC METABOLIC PANEL
Anion gap: 9 (ref 5–15)
BUN: 20 mg/dL (ref 8–23)
CO2: 24 mmol/L (ref 22–32)
Calcium: 8.6 mg/dL — ABNORMAL LOW (ref 8.9–10.3)
Chloride: 105 mmol/L (ref 98–111)
Creatinine, Ser: 0.52 mg/dL (ref 0.44–1.00)
GFR calc Af Amer: >60 mL/min (ref >60)
GFR calc non Af Amer: >60 mL/min (ref >60)
Glucose, Bld: 153 mg/dL — ABNORMAL HIGH (ref 70–99)
Potassium: 4.2 mmol/L (ref 3.5–5.1)
Sodium: 138 mmol/L (ref 135–145)

## 2020-01-14 LAB — PROCALCITONIN: Procalcitonin: <0.1 ng/mL

## 2020-01-14 LAB — GLUCOSE, CAPILLARY
Glucose-Capillary: 146 mg/dL — ABNORMAL HIGH (ref 70–99)
Glucose-Capillary: 126 mg/dL — ABNORMAL HIGH (ref 70–99)
Glucose-Capillary: 128 mg/dL — ABNORMAL HIGH (ref 70–99)
Glucose-Capillary: 124 mg/dL — ABNORMAL HIGH (ref 70–99)
Glucose-Capillary: 116 mg/dL — ABNORMAL HIGH (ref 70–99)
Glucose-Capillary: 137 mg/dL — ABNORMAL HIGH (ref 70–99)

## 2020-01-14 MED ORDER — BETHANECHOL CHLORIDE 10 MG PO TABS
10.0000 mg | ORAL_TABLET | Freq: Three times a day (TID) | ORAL | Status: DC
  Administered 2020-01-14 – 2020-01-20 (×20): 10 mg
  Filled 2020-01-14 (×20): qty 1

## 2020-01-14 MED ORDER — JEVITY 1.2 CAL PO LIQD
1000.0000 mL | ORAL | Status: DC
  Administered 2020-01-14 – 2020-01-18 (×4): 1000 mL
  Filled 2020-01-14 (×11): qty 1000

## 2020-01-14 NOTE — Progress Notes (Signed)
Physical Therapy Treatment Patient Details Name: Erika Stanton MRN: [id number] DOB: 10-Apr-1956 Today's Date: 01/14/2020    History of Present Illness 64 y.o. female with no past medical history.  Per husband patient had walked outside then came in to the house complaining of a headache at 9 10 in the morning.  She then had gone into the shower and called for husband due to left arm and left leg weakness. 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 Pt underwent coil embolization of RICA aneurysm on 8/25 with additional finding of LICA aneurysm.    PT Comments    Pt tolerates treatment well, following commands well and completing multiple transfers with significant assistance. PT remains very weak with no AROM noted in RUE. Pt does have elbow flexor tone present in BUEs during exercise. Pt also continues to demonstrate poor head control at this time. PT educates pt on CIR and the need for continued progression of activity tolerance, pt demonstrates some understanding. PT continues to recommend CIR at this time as the pt's level of arousal and participation continue to improve with acute therapies.   Follow Up Recommendations  CIR     Equipment Recommendations  Wheelchair (measurements PT);Wheelchair cushion (measurements PT);Hospital bed (mechanical lift, all if home today)    Recommendations for Other Services       Precautions / Restrictions Precautions Precautions: Fall Restrictions Weight Bearing Restrictions: No    Mobility  Bed Mobility Overal bed mobility: Needs Assistance Bed Mobility: Supine to Sit;Sit to Supine     Supine to sit: Max assist Sit to supine: Max assist      Transfers Overall transfer level: Needs assistance Equipment used: 1 person hand held assist Transfers: Sit to/from Stand Sit to Stand: Max assist         General transfer comment: pt performs 2 sit to stands with PT providing R knee block and BUE  support  Ambulation/Gait                 Stairs             Wheelchair Mobility    Modified Rankin (Stroke Patients Only) Modified Rankin (Stroke Patients Only) Pre-Morbid Rankin Score: No significant disability Modified Rankin: Severe disability     Balance Overall balance assessment: Needs assistance Sitting-balance support: Feet supported;Bilateral upper extremity supported Sitting balance-Leahy Scale: Poor Sitting balance - Comments: minG at edge of bed with BUE support   Standing balance support: Bilateral upper extremity supported Standing balance-Leahy Scale: Zero Standing balance comment: mod-maxA with BUE support, pt able to maintain stand for 30 second intervals with PT support                            Cognition Arousal/Alertness: Lethargic (but awakens easily to stimuli) Behavior During Therapy: Flat affect Overall Cognitive Status: Impaired/Different from baseline Area of Impairment: Attention;Memory;Following commands;Safety/judgement;Problem solving;Awareness                   Current Attention Level: Focused Memory: Decreased recall of precautions;Decreased short-term memory Following Commands: Follows one step commands with increased time Safety/Judgement: Decreased awareness of safety;Decreased awareness of deficits Awareness: Intellectual Problem Solving: Slow processing;Requires verbal cues;Requires tactile cues;Difficulty sequencing;Decreased initiation        Exercises General Exercises - Upper Extremity Elbow Flexion: PROM;Right;AAROM;Left;10 reps Elbow Extension: PROM;Right;AAROM;Left;10 reps    General Comments General comments (skin integrity, edema, etc.): VSS on RA  Pertinent Vitals/Pain Pain Assessment: Faces Faces Pain Scale: No hurt    Home Living                      Prior Function            PT Goals (current goals can now be found in the care plan section) Acute Rehab PT  Goals Patient Stated Goal: to get stronger Progress towards PT goals: Progressing toward goals    Frequency    Min 3X/week      PT Plan Current plan remains appropriate    Co-evaluation              AM-PAC PT "6 Clicks" Mobility   Outcome Measure  Help needed turning from your back to your side while in a flat bed without using bedrails?: Total Help needed moving from lying on your back to sitting on the side of a flat bed without using bedrails?: Total Help needed moving to and from a bed to a chair (including a wheelchair)?: Total Help needed standing up from a chair using your arms (e.g., wheelchair or bedside chair)?: Total Help needed to walk in hospital room?: Total Help needed climbing 3-5 steps with a railing? : Total 6 Click Score: 6    End of Session   Activity Tolerance: Patient tolerated treatment well Patient left: in bed;with call bell/phone within reach;with bed alarm set Nurse Communication: Mobility status;Need for lift equipment PT Visit Diagnosis: Other abnormalities of gait and mobility (R26.89);Muscle weakness (generalized) (M62.81);Other symptoms and signs involving the nervous system (R29.898);Pain     Time: 1937-9024 PT Time Calculation (min) (ACUTE ONLY): 27 min  Charges:  $Therapeutic Activity: 23-37 mins                     Zenaida Niece, PT, DPT Acute Rehabilitation Pager: [phone]    Zenaida Niece 01/14/2020, 3:23 PM

## 2020-01-14 NOTE — Progress Notes (Signed)
Nutrition Follow-up  DOCUMENTATION CODES:   Not applicable  INTERVENTION:   Tube feeding via Cortrak tube (gastric): Change to Jevity 1.2 at 60 ml/h (1440 ml per day) -added fiber  Prosource TF 45 ml TID  Provides 1888 kcal, 113 gm protein, 1167 ml free water daily   NUTRITION DIAGNOSIS:   Inadequate oral intake related to inability to eat as evidenced by NPO status. Ongoing.   GOAL:   Patient will meet greater than or equal to 90% of their needs Met with TF.   MONITOR:   TF tolerance, Diet advancement  REASON FOR ASSESSMENT:   Consult, Ventilator Enteral/tube feeding initiation and management  ASSESSMENT:   Pt with no PMH admitted with ruptured giant aneurysm (20 mm) causing acute intracranial hemorrhage at R basal ganglia. Pt is s/p angiogram and tent-coil embolization of R ICA aneurysm.   Pt discussed during ICU rounds and with RN.  Plan for CIR evaluation  8/26 MRI shows acute cortical and subcortical infarction in the L anterior inferior frontal lobe and L parietal vertex.  8/30 cortrak placed; gastric   Medications reviewed and include: senokot-s Labs reviewed:  CBG's: 126-146  TF: Osmolite 1.2 at 60 ml/h  Prosource TF 45 ml TID Provides 1888 kcal, 113 gm protein   Diet Order:   Diet Order    None      EDUCATION NEEDS:   No education needs have been identified at this time  Skin:  Skin Assessment: Reviewed RN Assessment  Last BM:  unknown  Height:   Ht Readings from Last 1 Encounters:  01/07/20 5' 6"  (1.676 m)    Weight:   Wt Readings from Last 1 Encounters:  01/14/20 83 kg    Ideal Body Weight:  59 kg  BMI:  Body mass index is 29.53 kg/m.  Estimated Nutritional Needs:   Kcal:  1800-2000  Protein:  100-115 grams  Fluid:  > 1.8 L/day  Lockie Pares., RD, LDN, CNSC See AMiON for contact information

## 2020-01-14 NOTE — Progress Notes (Signed)
NAME:  Erika Stanton, MRN:  [id number], DOB:  02-08-1956, LOS: 9 ADMISSION DATE:  01/05/2020, CONSULTATION DATE: 01/14/20  REFERRING MD:  Kerney Elbe, MD, CHIEF COMPLAINT:  Right sided headache, weakness   Brief History   Erika Stanton is a 64 y.o. 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 Since presentation, she has underwent placement of a flow diverting stent with IR.   History of present illness   Erika Stanton is a 64 y.o. female with no past medical history. Patient had walked outside then returned to the house complaining of a headache at 9/10 in the morning.  She later notified her husband of left arm and left leg weakness.  EMS was called. On arrival to the ED she was drowsy but complained of right-sided headache and left-sided weakness. She reported her headache was located along her right forehead. A STAT head CT was obtained which showed both intraventricular and intraparenchymal hemorrhage. 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   Past Medical History  None  Significant Hospital Events   8/24 - Admitted  Consults:  Neurosurgery Neurology Interventional Radiology  Procedures:  8/25 - Diagnostic cerebral angiogram, stent-supported coil embolization of RICA aneurysm   Significant Diagnostic Tests:  8/24 - CT Head Code Stroke Wo Contrast: Intra-axial hemorrhage at the right basal ganglia with an estimated volume of 18 mL, plus a small volume of subarachnoid hemorrhage and bilateral intraventricular blood.  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 8/25 CT head : Stent assisted coiling of a right carotid terminus aneurysm. Surrounding intraparenchymal hemorrhage has not apparently increased. Intraventricular blood appears similar, without hydrocephalus. 8/26 MRI brain: Development of acute cortical and subcortical infarction in the left anterior inferior frontal lobe and left parietal vertex. Consider spasm as a potential etiology. Few other scattered punctate foci of acute infarction within the right frontal and parietal cortical brain consistent with micro embolic infarctions  3/24-MWN: Normal velocities.  No signs of vasospasm.  Micro Data:  None  Antimicrobials:  None  Interim history/subjective:  No acute events overnight. Exam essentially unchanged.   Objective   Blood pressure (!) 158/71, pulse 95, temperature 98.7 F (37.1 C), temperature source Axillary, resp. rate (!) 21, height 5\' 6"  (1.676 m), weight 83 kg, SpO2 95 %.        Intake/Output Summary (Last 24 hours) at 01/14/2020 0758 Last data filed at 01/14/2020 0700 Gross per 24 hour  Intake 2979.19 ml  Output 2575 ml  Net 404.19 ml   Filed Weights   01/12/20 0400 01/13/20 0409 01/14/20 0400  Weight: 82.3 kg 82.2 kg 83 kg    Examination: General: elderly appearing female in NAD HENT: [place]/AT, PERRL, no JVD Lungs: Clear, no distress Cardiovascular: RRR, no MRG Abdomen: Soft, non-distended, non-tender. normoactive Extremities: No acute deformity.  Neuro: Sedate: Follows commands in LUE and LLE. Opens eyes to voice and sticks out tongue. Moving RLE spontaneously.    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 - Allow blood pressure to rise to 409 systolic -  continue IV fluids to maintain euvolemia and continue Nimotop to prevent vasospasm. - Keppra ppx - Ongoing neuro checks - Follow TCD - Tube feeding per protocol - ASA/Plavix per neurosurgery  Hyperglycemia without history of DM - SSI   Daily Goals Checklist  Pain/Anxiety/Delirium protocol (if indicated): No sedation Neuro vitals: every 2 hours AED's: On Keppra VAP protocol (if indicated): Not intubated DVT prophylaxis: SCDs.  Heparin SQ Nutrition Status: Tube feeds at goal GI prophylaxis: Not indicated Central lines: Peripheral IVs only Glucose control: Euglycemic sliding scale insulin. Mobility/therapy needs: PT OT following. Code Status: Full code Family Communication: Husband updated at bedside Disposition: ICU.   Labs   CBC: Recent Labs  Lab 01/09/20 0553 01/10/20 0545 01/11/20 0503 01/12/20 0431 01/13/20 0047  WBC 11.2* 11.2* 12.1* 9.9 8.7  NEUTROABS 8.1* 8.1* 9.4* 8.1* 6.6  HGB 11.1* 12.2 12.5 11.9* 11.4*  HCT 35.1* 37.8 38.0 37.5 35.9*  MCV 97.5 96.2 95.2 95.4 96.5  PLT 265 293 327 326 811    Basic Metabolic Panel: Recent Labs  Lab 01/07/20 1015 01/07/20 1322 01/07/20 1700 01/08/20 0509 01/09/20 0553 01/10/20 0545 01/13/20 0047  NA 140  --   --  140 143 137 139  K 3.2*  --   --  3.6 4.0 4.4 3.9  CL 107  --   --  108 108 103 105  CO2 25  --   --  24 24 23 22   GLUCOSE 134*  --   --  144* 157* 162* 179*  BUN 6*  --   --  11 16 18  27*  CREATININE 0.47  --   --  0.45 0.49 0.51 0.55  CALCIUM 8.3*  --   --  8.6* 8.8* 8.9 8.6*  MG  --  2.1 2.1 2.1 2.1  --   --   PHOS  --  2.0* 1.4* 1.2* 2.6  --   --    GFR: Estimated Creatinine Clearance: 78.2 mL/min (by C-G formula based on SCr of 0.55 mg/dL). Recent Labs  Lab 01/10/20 0545 01/11/20 0503 01/12/20 0431 01/13/20 0047  WBC 11.2* 12.1* 9.9 8.7    Liver Function Tests: No results for input(s): AST, ALT, ALKPHOS, BILITOT, PROT, ALBUMIN in the last 168 hours. No results for input(s): LIPASE,  AMYLASE in the last 168 hours. No results for input(s): AMMONIA in the last 168 hours.  ABG    Component Value Date/Time   PHART 7.460 (H) 01/07/2020 0838   PCO2ART 37.3 01/07/2020 0838   PO2ART 85 01/07/2020 0838   HCO3 26.6 01/07/2020 0838   TCO2 28 01/07/2020 0838   O2SAT 97.0 01/07/2020 0838     Coagulation Profile: No results for input(s): INR, PROTIME in the last 168 hours.  Cardiac Enzymes: No results for input(s): CKTOTAL, CKMB, CKMBINDEX, TROPONINI in the last 168 hours.  HbA1C: Hgb A1c MFr Bld  Date/Time Value Ref Range Status  01/05/2020 12:11 PM 6.0 (H) 4.8 - 5.6 % Final    Comment:    (NOTE)         Prediabetes: 5.7 - 6.4         Diabetes: >6.4         Glycemic control for adults with diabetes: <7.0     CBG: Recent Labs  Lab 01/13/20 1241 01/13/20  1651 01/13/20 1944 01/13/20 2326 01/14/20 [place], AGACNP-BC [place] for personal pager PCCM on call pager [phone]  01/14/2020 7:58 AM

## 2020-01-14 NOTE — Progress Notes (Signed)
Occupational Therapy Progress Note (late entry)  Pt demonstrates improved arousal during session.  She follows simple motor commands ~30% of the time with a delay.  She attempts to move Lt UE, but not Rt UE, however increased tone noted.  She is unable to participate in simple grooming due to bil. UE weakness.  She requires mod - max A for sitting balance EOB.  Spouse was present and very supportive.    Once her arousal improves, recommend CIR.    01/13/20 1500  OT Visit Information  Last OT Received On 01/13/20  Assistance Needed +2  History of Present Illness 64 y.o. female with no past medical history.  Per husband patient had walked outside then came in to the house complaining of a headache at 9 10 in the morning.  She then had gone into the shower and called for husband due to left arm and left leg weakness. 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 Pt underwent coil embolization of RICA aneurysm on 8/25 with additional finding of LICA aneurysm.  Precautions  Precautions Fall  Pain Assessment  Pain Assessment Faces  Faces Pain Scale 0  Cognition  Arousal/Alertness Lethargic  Behavior During Therapy Flat affect  Overall Cognitive Status Impaired/Different from baseline  Area of Impairment Orientation;Following commands;Problem solving  Current Attention Level Focused  Following Commands Follows one step commands inconsistently;Follows one step commands with increased time  Problem Solving Slow processing;Decreased initiation;Requires verbal cues;Requires tactile cues  General Comments Pt lethargic.  When EOB, she will open eyes partially.   With max cues, she will follow  one step commands intermittently   Difficult to assess due to Level of arousal  Upper Extremity Assessment  Upper Extremity Assessment LUE deficits/detail;RUE deficits/detail  RUE Deficits / Details mild spasticity noted.  No definite movement noted   RUE Coordination decreased fine  motor;decreased gross motor  LUE Deficits / Details Pt attempting to move Lt UE, mostly at elbow.  She will attempt to touch mouth on command and can perform ~30% of movement with significant effort   LUE Coordination decreased fine motor;decreased gross motor  Lower Extremity Assessment  Lower Extremity Assessment Defer to PT evaluation  ADL  General ADL Comments Pt requires total A for all aspects.  unable to assist at this time   Bed Mobility  Overal bed mobility Needs Assistance  Bed Mobility Supine to Sit;Sit to Supine  Supine to sit Max assist  Sit to supine Max assist  General bed mobility comments with max cues, pt able to assist with moving LEs off bed and on bed and able to assist minimally with lifting trunk   Balance  Overall balance assessment Needs assistance  Sitting-balance support Feet supported;No upper extremity supported  Sitting balance-Leahy Scale Poor  Sitting balance - Comments Pt required mod - max A to maintain EOB sitting - status fluctuated with fatigue and arousal   Postural control Left lateral lean  Vision- Assessment  Additional Comments Pt will open eyes partially, but does not fixate   Transfers  General transfer comment unable to safely attempt with +1 assist   General Comments  General comments (skin integrity, edema, etc.) pt's spouse present and participatory   OT - End of Session  Activity Tolerance Patient tolerated treatment well  Patient left in bed;with call bell/phone within reach;with bed alarm set;with family/visitor present  Nurse Communication Mobility status  OT Assessment/Plan  OT Plan Discharge plan remains appropriate  OT Visit Diagnosis Unsteadiness on feet (R26.81);Muscle  weakness (generalized) (M62.81);Cognitive communication deficit (R41.841);Hemiplegia and hemiparesis  Symptoms and signs involving cognitive functions Nontraumatic SAH  Hemiplegia - Right/Left Right  Hemiplegia - dominant/non-dominant Dominant  Hemiplegia -  caused by Nontraumatic SAH  OT Frequency (ACUTE ONLY) Min 2X/week  Recommendations for Other Services Rehab consult  Follow Up Recommendations CIR;Supervision/Assistance - 24 hour  OT Equipment None recommended by OT  AM-PAC OT "6 Clicks" Daily Activity Outcome Measure (Version 2)  Help from another person eating meals? 1  Help from another person taking care of personal grooming? 1  Help from another person toileting, which includes using toliet, bedpan, or urinal? 1  Help from another person bathing (including washing, rinsing, drying)? 1  Help from another person to put on and taking off regular upper body clothing? 1  Help from another person to put on and taking off regular lower body clothing? 1  6 Click Score 6  OT Goal Progression  Progress towards OT goals Progressing toward goals  OT Time Calculation  OT Start Time (ACUTE ONLY) 1332  OT Stop Time (ACUTE ONLY) 1403  OT Time Calculation (min) 31 min  OT General Charges  $OT Visit 1 Visit  OT Treatments  $Neuromuscular Re-education 23-37 mins  Nilsa Nutting., OTR/L Acute Rehabilitation Services Pager [phone] Office [phone]

## 2020-01-14 NOTE — Progress Notes (Addendum)
  NEUROSURGERY PROGRESS NOTE   No issues overnight.   EXAM:  BP (!) 158/71   Pulse 95   Temp 98.7 F (37.1 C) (Axillary)   Resp (!) 21   Ht 5\' 6"  (1.676 m)   Wt 83 kg   SpO2 95%   BMI 29.53 kg/m   Drowsy, easily awakened Oriented to self and year PERRL Follows commands consistently with BLE and LUE No motor response RUE  Date POD PCO2 HCT BP  MCA ACA PCA OPHT SIPH VERT Basilar  8/26 MR     Right  Left   47  52   -34  *   *  19   20  20    42  28   *  *   *      8/27 MS     Right  Left   25  *   *  *   *  *   20  19   33  27   *  *   *      8/30 MR     Right  Left   57  66   -30  -25   20  43   18  25   40  40   -20  -41   -32      9/1 GC      Right  Left   59  74   -49  *   23  *   89  19   107  65   -27  -15   *        IMPRESSION/PLAN 64 y.o. female SAH d#9POD#8stent-coil of large RICA terminus aneurysm.Stable neurologically. - continue supportive care, keppra, nimotop - continue asa, plavix - TCDslimited, slight increase in left MCA but not significant. Continue M/W/Fr - appreciate PCCMmanagement ofpatient - CIR candidate  ADDENDUM:  Agree w above  Pt s/e  Rehab planning later this week  Thank you for allowing me to participate in this patient's care.  Please do not hesitate to call with questions or concerns.   Elwin Sleight, [facility] Cell: [phone]

## 2020-01-15 ENCOUNTER — Inpatient Hospital Stay (HOSPITAL_BASED_OUTPATIENT_CLINIC_OR_DEPARTMENT_OTHER): Payer: BC Managed Care – PPO

## 2020-01-15 ENCOUNTER — Inpatient Hospital Stay (HOSPITAL_COMMUNITY): Payer: BC Managed Care – PPO

## 2020-01-15 DIAGNOSIS — I609 Nontraumatic subarachnoid hemorrhage, unspecified: Secondary | ICD-10-CM

## 2020-01-15 DIAGNOSIS — R569 Unspecified convulsions: Secondary | ICD-10-CM

## 2020-01-15 DIAGNOSIS — M7989 Other specified soft tissue disorders: Secondary | ICD-10-CM

## 2020-01-15 DIAGNOSIS — R609 Edema, unspecified: Secondary | ICD-10-CM

## 2020-01-15 DIAGNOSIS — E871 Hypo-osmolality and hyponatremia: Secondary | ICD-10-CM

## 2020-01-15 LAB — CBC WITH DIFFERENTIAL/PLATELET
Abs Immature Granulocytes: 0.04 10*3/uL (ref 0.00–0.07)
Basophils Absolute: 0 10*3/uL (ref 0.0–0.1)
Basophils Relative: 0 %
Eosinophils Absolute: 0.1 10*3/uL (ref 0.0–0.5)
Eosinophils Relative: 1 %
HCT: 35.5 % — ABNORMAL LOW (ref 36.0–46.0)
Hemoglobin: 11.3 g/dL — ABNORMAL LOW (ref 12.0–15.0)
Immature Granulocytes: 1 %
Lymphocytes Relative: 23 %
Lymphs Abs: 1.8 10*3/uL (ref 0.7–4.0)
MCH: 30.5 pg (ref 26.0–34.0)
MCHC: 31.8 g/dL (ref 30.0–36.0)
MCV: 95.9 fL (ref 80.0–100.0)
Monocytes Absolute: 0.7 10*3/uL (ref 0.1–1.0)
Monocytes Relative: 8 %
Neutro Abs: 5.4 10*3/uL (ref 1.7–7.7)
Neutrophils Relative %: 67 %
Platelets: 274 10*3/uL (ref 150–400)
RBC: 3.7 MIL/uL — ABNORMAL LOW (ref 3.87–5.11)
RDW: 12.7 % (ref 11.5–15.5)
WBC: 8.1 10*3/uL (ref 4.0–10.5)
nRBC: 0 % (ref 0.0–0.2)

## 2020-01-15 LAB — URINALYSIS, ROUTINE W REFLEX MICROSCOPIC
Bilirubin Urine: NEGATIVE
Glucose, UA: 50 mg/dL — AB
Ketones, ur: NEGATIVE mg/dL
Nitrite: NEGATIVE
Protein, ur: 100 mg/dL — AB
RBC / HPF: >50 RBC/hpf — ABNORMAL HIGH (ref 0–5)
Specific Gravity, Urine: 1.02 (ref 1.005–1.030)
WBC, UA: >50 WBC/hpf — ABNORMAL HIGH (ref 0–5)
pH: 7 (ref 5.0–8.0)

## 2020-01-15 LAB — GLUCOSE, CAPILLARY
Glucose-Capillary: 137 mg/dL — ABNORMAL HIGH (ref 70–99)
Glucose-Capillary: 143 mg/dL — ABNORMAL HIGH (ref 70–99)
Glucose-Capillary: 114 mg/dL — ABNORMAL HIGH (ref 70–99)
Glucose-Capillary: 116 mg/dL — ABNORMAL HIGH (ref 70–99)
Glucose-Capillary: 146 mg/dL — ABNORMAL HIGH (ref 70–99)
Glucose-Capillary: 137 mg/dL — ABNORMAL HIGH (ref 70–99)

## 2020-01-15 LAB — BASIC METABOLIC PANEL
Anion gap: 10 (ref 5–15)
BUN: 20 mg/dL (ref 8–23)
CO2: 21 mmol/L — ABNORMAL LOW (ref 22–32)
Calcium: 8.6 mg/dL — ABNORMAL LOW (ref 8.9–10.3)
Chloride: 103 mmol/L (ref 98–111)
Creatinine, Ser: 0.43 mg/dL — ABNORMAL LOW (ref 0.44–1.00)
GFR calc Af Amer: >60 mL/min (ref >60)
GFR calc non Af Amer: >60 mL/min (ref >60)
Glucose, Bld: 148 mg/dL — ABNORMAL HIGH (ref 70–99)
Potassium: 4.1 mmol/L (ref 3.5–5.1)
Sodium: 134 mmol/L — ABNORMAL LOW (ref 135–145)

## 2020-01-15 MED ORDER — SODIUM CHLORIDE 0.9 % IV SOLN
2.0000 g | INTRAVENOUS | Status: DC
  Administered 2020-01-15 – 2020-01-20 (×6): 2 g via INTRAVENOUS
  Filled 2020-01-15 (×3): qty 20
  Filled 2020-01-15 (×2): qty 2
  Filled 2020-01-15: qty 20

## 2020-01-15 NOTE — Progress Notes (Addendum)
  NEUROSURGERY PROGRESS NOTE   No issues overnight.   EXAM:  BP 125/63   Pulse 95   Temp 97.6 F (36.4 C)   Resp (!) 22   Ht 5\' 6"  (1.676 m)   Wt 83 kg   SpO2 98%   BMI 29.53 kg/m   Drowsy, easily awakened Oriented to self and year PERRL Follows commands consistently with BLE and LUE No motor response RUE  IMPRESSION/PLAN 64 y.o. female SAH d#10POD#9stent-coil of large RICA terminus aneurysm.Stable neurologically. - continue supportive care, keppra, nimotop - continue asa, plavix - TCDs today - appreciate PCCMmanagement ofpatient - CIR candidate. Could be discharged tomorrow if TCDs today without evidence of vasospasm, bed availability, patient endurace.   ADDENDUM  Agree w above  Pt s/e  CIR planing  Thank you for allowing me to participate in this patient's care.  Please do not hesitate to call with questions or concerns.   Elwin Sleight, [facility] Cell: [phone]

## 2020-01-15 NOTE — Progress Notes (Addendum)
NAME:  Erika Stanton, MRN:  [id number], DOB:  01/14/1956, LOS: 72 ADMISSION DATE:  01/05/2020, CONSULTATION DATE: 01/15/20  REFERRING MD:  Kerney Elbe, MD, CHIEF COMPLAINT:  Right sided headache, weakness   Brief History   Erika Stanton is a 65 y.o. 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 Since presentation, she has underwent placement of a flow diverting stent with IR.   History of present illness   Erika Stanton is a 64 y.o. female with no past medical history. Patient had walked outside then returned to the house complaining of a headache at 9/10 in the morning.  She later notified her husband of left arm and left leg weakness.  EMS was called. On arrival to the ED she was drowsy but complained of right-sided headache and left-sided weakness. She reported her headache was located along her right forehead. A STAT head CT was obtained which showed both intraventricular and intraparenchymal hemorrhage. 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   Past Medical History  None  Significant Hospital Events   8/24 - Admitted  Consults:  Neurosurgery Neurology Interventional Radiology  Procedures:  8/25 - Diagnostic cerebral angiogram, stent-supported coil embolization of RICA aneurysm   Significant Diagnostic Tests:  8/24 - CT Head Code Stroke Wo Contrast: Intra-axial hemorrhage at the right basal ganglia with an estimated volume of 18 mL, plus a small volume of subarachnoid hemorrhage and bilateral intraventricular blood.  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 8/25 CT head : Stent assisted coiling of a right carotid terminus aneurysm. Surrounding intraparenchymal hemorrhage has not apparently increased. Intraventricular blood appears similar, without hydrocephalus. 8/26 MRI brain: Development of acute cortical and subcortical infarction in the left anterior inferior frontal lobe and left parietal vertex. Consider spasm as a potential etiology. Few other scattered punctate foci of acute infarction within the right frontal and parietal cortical brain consistent with micro embolic infarctions  6/25-WLS: Normal velocities.  No signs of vasospasm.  Micro Data:  Urine culture 9/3 >  Antimicrobials:  Ceftriaxone 9/3 >  Interim history/subjective:  No acute events overnight. Cloudy urine.   Objective   Blood pressure 125/63, pulse 95, temperature 98 F (36.7 C), temperature source Axillary, resp. rate (!) 22, height 5\' 6"  (1.676 m), weight 83 kg, SpO2 98 %.        Intake/Output Summary (Last 24 hours) at 01/15/2020 1228 Last data filed at 01/15/2020 1100 Gross per 24 hour  Intake 2888 ml  Output 200 ml  Net 2688 ml   Filed Weights   01/12/20 0400 01/13/20 0409 01/14/20 0400  Weight: 82.3 kg 82.2 kg 83 kg    Examination: General: Elderly female.  HENT: Normocephalic, questionable facial droop on L. No JVD Lungs: Clear, no distress Cardiovascular: RRR, no MRG Abdomen: Soft, non-tender, non-distended  Extremities: No acute deformity.  Neuro: Sedate, arousable to verbal. Follows commands in L upper and both lowers. Moves R upper to pain.     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 - Permissive hypertension to systolic 102HENI - Goal  euvolemia, DC IVF - Nimodipine  - Keppra ppx - Ongoing neuro checks - Neurosurgery monitoring TCDs - Tube feeding per protocol - ASA/Plavix per neurosurgery  Hyperglycemia without history of DM - SSI  Urinary tract infection: based on UA - Send culture - Start CTX  LLE erythema, warmth - Lower extremity dopplers to rule out DVT.   Daily Goals Checklist  Pain/Anxiety/Delirium protocol (if indicated): No sedation Neuro vitals: per unit protocol AED's: On Keppra VAP protocol (if indicated): Not intubated DVT prophylaxis:  Heparin SQ Nutrition Status: Tube feeds at goal GI prophylaxis: Not indicated Central lines: Peripheral IVs only Glucose control: Euglycemic sliding scale insulin. Mobility/therapy needs: PT OT following. Code Status: Full code Family Communication: Family updated bedside.  Disposition: ICU   Labs   CBC: Recent Labs  Lab 01/10/20 0545 01/11/20 0503 01/12/20 0431 01/13/20 0047 01/14/20 0845  WBC 11.2* 12.1* 9.9 8.7 7.0  NEUTROABS 8.1* 9.4* 8.1* 6.6 4.6  HGB 12.2 12.5 11.9* 11.4* 11.1*  HCT 37.8 38.0 37.5 35.9* 35.7*  MCV 96.2 95.2 95.4 96.5 95.5  PLT 293 327 326 350 778    Basic Metabolic Panel: Recent Labs  Lab 01/09/20 0553 01/10/20 0545 01/13/20 0047 01/14/20 0845 01/15/20 0856  NA 143 137 139 138 134*  K 4.0 4.4 3.9 4.2 4.1  CL 108 103 105 105 103  CO2 24 23 22 24  21*  GLUCOSE 157* 162* 179* 153* 148*  BUN 16 18 27* 20 20  CREATININE 0.49 0.51 0.55 0.52 0.43*  CALCIUM 8.8* 8.9 8.6* 8.6* 8.6*  MG 2.1  --   --   --   --   PHOS 2.6  --   --   --   --    GFR: Estimated Creatinine Clearance: 78.2 mL/min (A) (by C-G formula based on SCr of 0.43 mg/dL (L)). Recent Labs  Lab 01/11/20 0503 01/12/20 0431 01/13/20 0047 01/14/20 0845  PROCALCITON  --   --   --  <0.10  WBC 12.1* 9.9 8.7 7.0    Liver Function Tests: No results for input(s): AST, ALT, ALKPHOS, BILITOT, PROT, ALBUMIN in the last 168 hours. No results for input(s):  LIPASE, AMYLASE in the last 168 hours. No results for input(s): AMMONIA in the last 168 hours.  ABG    Component Value Date/Time   PHART 7.460 (H) 01/07/2020 0838   PCO2ART 37.3 01/07/2020 0838   PO2ART 85 01/07/2020 0838   HCO3 26.6 01/07/2020 0838   TCO2 28 01/07/2020 0838   O2SAT 97.0 01/07/2020 0838     Coagulation Profile: No results for input(s): INR, PROTIME in the last 168 hours.  Cardiac Enzymes: No results for input(s): CKTOTAL, CKMB, CKMBINDEX, TROPONINI in the last 168 hours.  HbA1C: Hgb A1c MFr Bld  Date/Time Value Ref Range Status  01/05/2020 12:11 PM 6.0 (H) 4.8 - 5.6 % Final    Comment:    (NOTE)         Prediabetes: 5.7 - 6.4         Diabetes: >6.4         Glycemic control for adults with diabetes: <7.0     CBG: Recent Labs  Lab 01/14/20 1929 01/14/20 2321 01/15/20 0348 01/15/20 0816 01/15/20 1148  GLUCAP 116* 137* 137* 143* 114*  Georgann Housekeeper, AGACNP-BC [employer]  See Amion for personal pager PCCM on call pager [phone]  01/15/2020 12:28 PM

## 2020-01-15 NOTE — Progress Notes (Signed)
Transcranial Doppler  Date POD PCO2 HCT BP  MCA ACA PCA OPHT SIPH VERT Basilar  8/26 MR     Right  Left   47  52   -34  *   *  19   20  20    42  28   *  *   *      8/27 MS     Right  Left   25  *   *  *   *  *   20  19   33  27   *  *   *      8/30 MR     Right  Left   57  66   -30  -25   20  43   18  25   40  40   -20  -41   -32      9/1 GC      Right  Left   59  74   -49  *   23  *   19  19   43  65   -26  -15   *      9/3 GC      Right  Left   52  47   *  -19   36  24   21  23    36  40   *  *   *           Right  Left                                            Right  Left                                        MCA = Middle Cerebral Artery      OPHT = Opthalmic Artery     BASILAR = Basilar Artery   ACA = Anterior Cerebral Artery     SIPH = Carotid Siphon PCA = Posterior Cerebral Artery   VERT = Verterbral Artery                   Normal MCA = 62+\-12 ACA = 50+\-12 PCA = 42+\-23  * Unable to insonate.  01/08/20- study was very technically limited due to excessive patient movement. 01/13/20 - Study was technically limited due to patient positioning and movement. 01/15/20 - Study was technically limited due to patient positioning and movement.  01/15/20 9:42 AM Carlos Levering RVT

## 2020-01-15 NOTE — Progress Notes (Signed)
Inpatient Rehabilitation Admissions Coordinator  I await further progress with therapy before proceeding with insurance authorization for possible CIR admit. Insurance closed on Monday due to the holiday. I will begin authorization Tuesday, for hopeful admit next week when she is more arousable. I contacted Vinnie, PA and he is aware of plan.  Danne Baxter, RN, MSN Rehab Admissions Coordinator [phone] 01/15/2020 12:58 PM

## 2020-01-15 NOTE — Progress Notes (Signed)
Lower extremity venous has been completed.   Preliminary results in CV Proc.   Abram Sander 01/15/2020 2:37 PM

## 2020-01-15 NOTE — Plan of Care (Signed)
  Problem: Clinical Measurements: Goal: Will remain free from infection Outcome: Progressing Goal: Diagnostic test results will improve Outcome: Progressing Goal: Respiratory complications will improve Outcome: Progressing Goal: Cardiovascular complication will be avoided Outcome: Progressing   Problem: Activity: Goal: Risk for activity intolerance will decrease Outcome: Progressing   Problem: Nutrition: Goal: Adequate nutrition will be maintained Outcome: Progressing   Problem: Pain Managment: Goal: General experience of comfort will improve Outcome: Progressing   Problem: Safety: Goal: Ability to remain free from injury will improve Outcome: Progressing   Problem: Skin Integrity: Goal: Risk for impaired skin integrity will decrease Outcome: Progressing   Problem: Nutrition: Goal: Risk of aspiration will decrease Outcome: Progressing

## 2020-01-16 LAB — GLUCOSE, CAPILLARY
Glucose-Capillary: 120 mg/dL — ABNORMAL HIGH (ref 70–99)
Glucose-Capillary: 143 mg/dL — ABNORMAL HIGH (ref 70–99)
Glucose-Capillary: 130 mg/dL — ABNORMAL HIGH (ref 70–99)
Glucose-Capillary: 138 mg/dL — ABNORMAL HIGH (ref 70–99)
Glucose-Capillary: 125 mg/dL — ABNORMAL HIGH (ref 70–99)
Glucose-Capillary: 154 mg/dL — ABNORMAL HIGH (ref 70–99)

## 2020-01-16 NOTE — Progress Notes (Signed)
NAME:  Erika Stanton, MRN:  [id number], DOB:  01-Oct-1955, LOS: 16 ADMISSION DATE:  01/05/2020, CONSULTATION DATE: 01/16/20  REFERRING MD:  Kerney Elbe, MD, CHIEF COMPLAINT:  Right sided headache, weakness   Brief History   Erika Stanton is a 64 y.o. 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 Since presentation, she has underwent placement of a flow diverting stent with IR.   Past Medical History  None  Significant Hospital Events   8/24 - Admitted  Consults:  Neurosurgery Neurology Interventional Radiology  Procedures:  8/25 - Diagnostic cerebral angiogram, stent-supported coil embolization of RICA aneurysm   Significant Diagnostic Tests:  8/24 - CT Head Code Stroke Wo Contrast: Intra-axial hemorrhage at the right basal ganglia with an estimated volume of 18 mL, plus a small volume of subarachnoid hemorrhage and bilateral intraventricular blood.  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 8/25 CT head : Stent assisted coiling of a right carotid terminus aneurysm. Surrounding intraparenchymal hemorrhage has not apparently increased. Intraventricular blood appears similar, without hydrocephalus. 8/26 MRI brain: Development of acute cortical and subcortical infarction in the left anterior inferior frontal lobe and left parietal vertex. Consider spasm as a potential etiology. Few other scattered punctate foci of acute infarction within the right frontal and parietal  cortical brain consistent with micro embolic infarctions  4/70-JGG: Normal velocities.  No signs of vasospasm.  Micro Data:  Urine culture 9/3 >  Antimicrobials:  Ceftriaxone 9/3 >  Interim history/subjective:  Patient is more awake today, remained afebrile  Objective   Blood pressure 122/82, pulse 98, temperature 100.1 F (37.8 C), temperature source Axillary, resp. rate (!) 27, height 5\' 6"  (1.676 m), weight 82.8 kg, SpO2 98 %.        Intake/Output Summary (Last 24 hours) at 01/16/2020 1227 Last data filed at 01/16/2020 0600 Gross per 24 hour  Intake 1434.46 ml  Output 1400 ml  Net 34.46 ml   Filed Weights   01/13/20 0409 01/14/20 0400 01/16/20 0400  Weight: 82.2 kg 83 kg 82.8 kg    Examination: General: Acutely ill, elderly female, lying on the bed HENT: Normocephalic, No JVD Lungs: Clear, no distress Cardiovascular: RRR, no MRG Abdomen: Soft, non-tender, non-distended  Extremities: No acute deformity.  Neuro: Awake, following simple commands.  Moving L upper and both lowers. Moves R upper to pain.     Resolved Hospital Problem list   N/A  Assessment & Plan:   H/H 3, mF4 SAH due to ruptured right ICA terminus aneurysm status post coiling Intraparenchymal hemorrhage Periprocedural ischemic stroke. Encephalopathy due to bihemispheric injury  Continue neuro check every 2 hours Permissive hypertension with SBP goal ~179mmHg Maintain euvolemia, and normal natremia Continue nimodipine  Continue Keppra for seizure prophylaxis Daily TCD's Continue dual antiplatelet agents due to stent Avoid sedation  Possible acute urinary tract infection Patient's urine is foul-smelling, UA is consistent with acute UTI Urine culture is pending Started on ceftriaxone day 2 of 5  Hyponatremia Patient serum sodium is 134, closely  watch as this could be indication of early vasospasm   Daily Goals Checklist  Pain/Anxiety/Delirium protocol (if indicated): No sedation Neuro  vitals: per unit protocol AED's: On Keppra VAP protocol (if indicated): Not intubated DVT prophylaxis:  Heparin SQ Nutrition Status: Tube feeds at goal GI prophylaxis: Not indicated Central lines: Peripheral IVs only Glucose control: Euglycemic sliding scale insulin. Mobility/therapy needs: PT OT following. Code Status: Full code Family Communication: Family updated bedside.  Disposition: ICU   Labs   CBC: Recent Labs  Lab 01/11/20 0503 01/12/20 0431 01/13/20 0047 01/14/20 0845 01/15/20 0856  WBC 12.1* 9.9 8.7 7.0 8.1  NEUTROABS 9.4* 8.1* 6.6 4.6 5.4  HGB 12.5 11.9* 11.4* 11.1* 11.3*  HCT 38.0 37.5 35.9* 35.7* 35.5*  MCV 95.2 95.4 96.5 95.5 95.9  PLT 327 326 350 319 673    Basic Metabolic Panel: Recent Labs  Lab 01/10/20 0545 01/13/20 0047 01/14/20 0845 01/15/20 0856  NA 137 139 138 134*  K 4.4 3.9 4.2 4.1  CL 103 105 105 103  CO2 23 22 24  21*  GLUCOSE 162* 179* 153* 148*  BUN 18 27* 20 20  CREATININE 0.51 0.55 0.52 0.43*  CALCIUM 8.9 8.6* 8.6* 8.6*   GFR: Estimated Creatinine Clearance: 78.1 mL/min (A) (by C-G formula based on SCr of 0.43 mg/dL (L)). Recent Labs  Lab 01/12/20 0431 01/13/20 0047 01/14/20 0845 01/15/20 0856  PROCALCITON  --   --  <0.10  --   WBC 9.9 8.7 7.0 8.1    Liver Function Tests: No results for input(s): AST, ALT, ALKPHOS, BILITOT, PROT, ALBUMIN in the last 168 hours. No results for input(s): LIPASE, AMYLASE in the last 168 hours. No results for input(s): AMMONIA in the last 168 hours.  ABG    Component Value Date/Time   PHART 7.460 (H) 01/07/2020 0838   PCO2ART 37.3 01/07/2020 0838   PO2ART 85 01/07/2020 0838   HCO3 26.6 01/07/2020 0838   TCO2 28 01/07/2020 0838   O2SAT 97.0 01/07/2020 0838     Coagulation Profile: No results for input(s): INR, PROTIME in the last 168 hours.  Cardiac Enzymes: No results for input(s): CKTOTAL, CKMB, CKMBINDEX, TROPONINI in the last 168 hours.  HbA1C: Hgb A1c MFr Bld  Date/Time  Value Ref Range Status  01/05/2020 12:11 PM 6.0 (H) 4.8 - 5.6 % Final    Comment:    (NOTE)         Prediabetes: 5.7 - 6.4         Diabetes: >6.4         Glycemic control for adults with diabetes: <7.0     CBG: Recent Labs  Lab 01/15/20 1949 01/15/20 2320 01/16/20 0316 01/16/20 0834 01/16/20 1113  GLUCAP 146* 137* 120* 143* 130*    Total critical care time: 35 minutes  Performed by: Gonzales care time was exclusive of separately billable procedures and treating other patients.   Critical care was necessary to treat or prevent imminent or life-threatening deterioration.   Critical care was time spent personally by me on the following activities: development of treatment plan with patient and/or surrogate as well as nursing, discussions with consultants, evaluation of patient's response to treatment, examination of patient, obtaining history from patient or surrogate, ordering and performing treatments and interventions, ordering and review of laboratory studies, ordering and review of radiographic studies, pulse oximetry and re-evaluation of patient's condition.   Jacky Kindle MD Critical care physician [facility]  Pager: [phone] Mobile: [phone]

## 2020-01-16 NOTE — Progress Notes (Signed)
Providing Compassionate, Quality Care - Together   Subjective: Nurse reports no adverse events overnight  Objective: Vital signs in last 24 hours: Temp:  [98 F (36.7 C)-100.9 F (38.3 C)] 100.1 F (37.8 C) (09/04 0800) Pulse Rate:  [66-130] 108 (09/04 0800) Resp:  [16-27] 16 (09/04 0800) BP: (109-157)/(63-92) 133/67 (09/04 0800) SpO2:  [90 %-100 %] 94 % (09/04 0800) Weight:  [82.8 kg] 82.8 kg (09/04 0400)  Intake/Output from previous day: 09/03 0701 - 09/04 0700 In: 2571 [P.O.:60; I.V.:381; NG/GT:2130] Out: 1600 [Urine:1200; Stool:400] Intake/Output this shift: No intake/output data recorded.  Drowsy, responds to voice PERRLA Delayed speech, oriented to self Followed commands BLE and LUE   Lab Results: Recent Labs    01/14/20 0845 01/15/20 0856  WBC 7.0 8.1  HGB 11.1* 11.3*  HCT 35.7* 35.5*  PLT 319 274   BMET Recent Labs    01/14/20 0845 01/15/20 0856  NA 138 134*  K 4.2 4.1  CL 105 103  CO2 24 21*  GLUCOSE 153* 148*  BUN 20 20  CREATININE 0.52 0.43*  CALCIUM 8.6* 8.6*    Studies/Results: VAS [place] LOWER EXTREMITY VENOUS (DVT)  Result Date: 01/15/2020  Lower Venous DVTStudy Indications: Swelling, and Edema.  Limitations: Patient positioning. Comparison Study: no prior Performing Technologist: Abram Sander RVS  Examination Guidelines: A complete evaluation includes B-mode imaging, spectral Doppler, color Doppler, and power Doppler as needed of all accessible portions of each vessel. Bilateral testing is considered an integral part of a complete examination. Limited examinations for reoccurring indications may be performed as noted. The reflux portion of the exam is performed with the patient in reverse Trendelenburg.  +-----+---------------+---------+-----------+----------+--------------+ RIGHTCompressibilityPhasicitySpontaneityPropertiesThrombus Aging +-----+---------------+---------+-----------+----------+--------------+ CFV  Full           Yes       Yes                                 +-----+---------------+---------+-----------+----------+--------------+   +---------+---------------+---------+-----------+----------+--------------+ LEFT     CompressibilityPhasicitySpontaneityPropertiesThrombus Aging +---------+---------------+---------+-----------+----------+--------------+ CFV      Full           Yes      Yes                                 +---------+---------------+---------+-----------+----------+--------------+ SFJ      Full                                                        +---------+---------------+---------+-----------+----------+--------------+ FV Prox  Full                                                        +---------+---------------+---------+-----------+----------+--------------+ FV Mid   Full                                                        +---------+---------------+---------+-----------+----------+--------------+ FV DistalFull                                                        +---------+---------------+---------+-----------+----------+--------------+  PFV      Full                                                        +---------+---------------+---------+-----------+----------+--------------+ POP      Full           Yes      Yes                                 +---------+---------------+---------+-----------+----------+--------------+ PTV      Full                                                        +---------+---------------+---------+-----------+----------+--------------+ PERO                                                  Not visualized +---------+---------------+---------+-----------+----------+--------------+     *See table(s) above for measurements and observations.    Preliminary    VAS [place] TRANSCRANIAL DOPPLER  Result Date: 01/15/2020  Transcranial Doppler Indications: Subarachnoid hemorrhage. Limitations: Patient positioning, patient  movement Limitations for diagnostic windows: Unable to insonate occipital window. Comparison Study: 01/07/2020, 01/08/2020, 01/11/2020, 01/13/2020 Performing Technologist: Oliver Hum RVT  Examination Guidelines: A complete evaluation includes B-mode imaging, spectral Doppler, color Doppler, and power Doppler as needed of all accessible portions of each vessel. Bilateral testing is considered an integral part of a complete examination. Limited examinations for reoccurring indications may be performed as noted.  +----------+-------------+----------+-----------+-------+ RIGHT TCD Right VM (cm)Depth (cm)PulsatilityComment +----------+-------------+----------+-----------+-------+ MCA           52.00                 1.44            +----------+-------------+----------+-----------+-------+ Term ICA      20.00                 0.93            +----------+-------------+----------+-----------+-------+ PCA           36.00                 0.99            +----------+-------------+----------+-----------+-------+ Opthalmic     21.00                 1.51            +----------+-------------+----------+-----------+-------+ ICA siphon    36.00                 1.38            +----------+-------------+----------+-----------+-------+  +----------+------------+----------+-----------+-------+ LEFT TCD  Left VM (cm)Depth (cm)PulsatilityComment +----------+------------+----------+-----------+-------+ MCA          47.00                 1.16            +----------+------------+----------+-----------+-------+ ACA          -19.00  1.37            +----------+------------+----------+-----------+-------+ Term ICA     17.00                 1.35            +----------+------------+----------+-----------+-------+ PCA          24.00                 1.11            +----------+------------+----------+-----------+-------+ Opthalmic    23.00                 1.42             +----------+------------+----------+-----------+-------+ ICA siphon   40.00                 1.26            +----------+------------+----------+-----------+-------+     Preliminary     Assessment/Plan: Patient 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 Neuro exam is stable. TCDs 01/15/2020 without evidence of vasospasm.   LOS: 11 days    -Continue supportive care, Keppra, Nimotop -Awaiting approval for CIR   Viona Gilmore, DNP, AGNP-C Nurse Practitioner  [facility] [place]. [address], [place], [place] [address] P: [phone]    F: [phone]  01/16/2020, 9:18 AM

## 2020-01-17 LAB — BASIC METABOLIC PANEL
Anion gap: 11 (ref 5–15)
BUN: 25 mg/dL — ABNORMAL HIGH (ref 8–23)
CO2: 24 mmol/L (ref 22–32)
Calcium: 9.1 mg/dL (ref 8.9–10.3)
Chloride: 106 mmol/L (ref 98–111)
Creatinine, Ser: 0.46 mg/dL (ref 0.44–1.00)
GFR calc Af Amer: >60 mL/min (ref >60)
GFR calc non Af Amer: >60 mL/min (ref >60)
Glucose, Bld: 143 mg/dL — ABNORMAL HIGH (ref 70–99)
Potassium: 3.7 mmol/L (ref 3.5–5.1)
Sodium: 141 mmol/L (ref 135–145)

## 2020-01-17 LAB — GLUCOSE, CAPILLARY
Glucose-Capillary: 107 mg/dL — ABNORMAL HIGH (ref 70–99)
Glucose-Capillary: 125 mg/dL — ABNORMAL HIGH (ref 70–99)
Glucose-Capillary: 139 mg/dL — ABNORMAL HIGH (ref 70–99)
Glucose-Capillary: 142 mg/dL — ABNORMAL HIGH (ref 70–99)
Glucose-Capillary: 153 mg/dL — ABNORMAL HIGH (ref 70–99)
Glucose-Capillary: 155 mg/dL — ABNORMAL HIGH (ref 70–99)

## 2020-01-17 LAB — URINE CULTURE: Culture: >=100000 — AB

## 2020-01-17 NOTE — Progress Notes (Signed)
   Providing Compassionate, Quality Care - Together  NEUROSURGERY PROGRESS NOTE   S: No issues overnight.   O: EXAM:  BP 127/79 (BP Location: Left Arm)   Pulse 77   Temp 99.2 F (37.3 C) (Axillary)   Resp (!) 26   Ht 5\' 6"  (1.676 m)   Wt 82.4 kg   SpO2 98%   BMI 29.32 kg/m   Drowsy, responds to voice, eyes closed PERRLA Delayed speech, oriented to self Followed commands BLE and LUE  ASSESSMENT:  64 y.o. female with  SAH D#12POD#11stent-coil of large RICA terminus aneurysm.Stable neurologically. - continue supportive care, keppra, nimotop - continue asa, plavix - CIR candidate - transfer to 4NP if bed available   Thank you for allowing me to participate in this patient's care.  Please do not hesitate to call with questions or concerns.   Elwin Sleight, [facility] Cell: [phone]

## 2020-01-17 NOTE — Progress Notes (Signed)
NAME:  Erika Stanton, MRN:  [id number], DOB:  June 15, 1955, LOS: 12 ADMISSION DATE:  01/05/2020, CONSULTATION DATE: 01/17/20  REFERRING MD:  Kerney Elbe, MD, CHIEF COMPLAINT:  Right sided headache, weakness   Brief History   Erika Stanton is a 64 y.o. 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 Since presentation, she has underwent placement of a flow diverting stent with IR.   Past Medical History  None  Significant Hospital Events   8/24 - Admitted  Consults:  Neurosurgery Neurology Interventional Radiology  Procedures:  8/25 - Diagnostic cerebral angiogram, stent-supported coil embolization of RICA aneurysm   Significant Diagnostic Tests:  8/24 - CT Head Code Stroke Wo Contrast: Intra-axial hemorrhage at the right basal ganglia with an estimated volume of 18 mL, plus a small volume of subarachnoid hemorrhage and bilateral intraventricular blood.  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 8/25 CT head : Stent assisted coiling of a right carotid terminus aneurysm. Surrounding intraparenchymal hemorrhage has not apparently increased. Intraventricular blood appears similar, without hydrocephalus. 8/26 MRI brain: Development of acute cortical and subcortical infarction in the left anterior inferior frontal lobe and left parietal vertex. Consider spasm as a potential etiology. Few other scattered punctate foci of acute infarction within the right frontal and parietal  cortical brain consistent with micro embolic infarctions  5/62-BWL: Normal velocities.  No signs of vasospasm.  Micro Data:  Urine culture 9/3 >  Antimicrobials:  Ceftriaxone 9/3 >  Interim history/subjective:  Patient remained afebrile overnight with T-max of 99.3  Objective   Blood pressure (!) 155/76, pulse 83, temperature 98.4 F (36.9 C), temperature source Axillary, resp. rate (!) 26, height 5\' 6"  (1.676 m), weight 82.4 kg, SpO2 96 %.        Intake/Output Summary (Last 24 hours) at 01/17/2020 1426 Last data filed at 01/17/2020 1300 Gross per 24 hour  Intake 1970 ml  Output 1600 ml  Net 370 ml   Filed Weights   01/14/20 0400 01/16/20 0400 01/17/20 0500  Weight: 83 kg 82.8 kg 82.4 kg    Examination: General: Acutely ill, elderly female, lying on the bed HENT: Normocephalic, No JVD Lungs: Clear, no distress Cardiovascular: RRR, no MRG Abdomen: Soft, non-tender, non-distended  Extremities: No acute deformity.  Neuro: Opens eyes with vocal stimuli, follows simple commands.  She has disconjugate gaze, pupils bilaterally equal and reactive to light.  Plegic right upper extremity, withdrawing on all other extremities.  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  Continue neuro check every 2 hours Permissive hypertension with SBP goal ~123mmHg Maintain euvolemia, and normal natremia Continue nimodipine  DC Keppra, as she completed 7 days therapy Daily TCD's Continue dual antiplatelet agents due to stent Avoid sedation  Acute urinary tract infection with gram-negative rods Patient's urine is foul-smelling, UA is consistent  with acute UTI Urine culture growing gram-negative rods, follow-up sensitivity results Continue ceftriaxone day 3 of  5  Hyponatremia Patient serum sodium improved to 141 Follow-up closely   Daily Goals Checklist  Pain/Anxiety/Delirium protocol (if indicated): No sedation Neuro vitals: per unit protocol VAP protocol (if indicated): N/A DVT prophylaxis:  Heparin SQ Nutrition Status: Tube feeds at goal GI prophylaxis: Not indicated Glucose control: Euglycemic sliding scale insulin. Mobility/therapy needs: PT OT following. Code Status: Full code Family Communication: Patient's husband updated over the phone Disposition: Transfer to PCU per primary team  PCCM will sign off, please call with question Labs   CBC: Recent Labs  Lab 01/11/20 0503 01/12/20 0431 01/13/20 0047 01/14/20 0845 01/15/20 0856  WBC 12.1* 9.9 8.7 7.0 8.1  NEUTROABS 9.4* 8.1* 6.6 4.6 5.4  HGB 12.5 11.9* 11.4* 11.1* 11.3*  HCT 38.0 37.5 35.9* 35.7* 35.5*  MCV 95.2 95.4 96.5 95.5 95.9  PLT 327 326 350 319 263    Basic Metabolic Panel: Recent Labs  Lab 01/13/20 0047 01/14/20 0845 01/15/20 0856 01/17/20 0506  NA 139 138 134* 141  K 3.9 4.2 4.1 3.7  CL 105 105 103 106  CO2 22 24 21* 24  GLUCOSE 179* 153* 148* 143*  BUN 27* 20 20 25*  CREATININE 0.55 0.52 0.43* 0.46  CALCIUM 8.6* 8.6* 8.6* 9.1   GFR: Estimated Creatinine Clearance: 77.8 mL/min (by C-G formula based on SCr of 0.46 mg/dL). Recent Labs  Lab 01/12/20 0431 01/13/20 0047 01/14/20 0845 01/15/20 0856  PROCALCITON  --   --  <0.10  --   WBC 9.9 8.7 7.0 8.1    Liver Function Tests: No results for input(s): AST, ALT, ALKPHOS, BILITOT, PROT, ALBUMIN in the last 168 hours. No results for input(s): LIPASE, AMYLASE in the last 168 hours. No results for input(s): AMMONIA in the last 168 hours.  ABG    Component Value Date/Time   PHART 7.460 (H) 01/07/2020 0838   PCO2ART 37.3 01/07/2020 0838   PO2ART 85 01/07/2020 0838   HCO3 26.6 01/07/2020 0838   TCO2 28 01/07/2020 0838   O2SAT 97.0 01/07/2020 0838     Coagulation Profile: No results for  input(s): INR, PROTIME in the last 168 hours.  Cardiac Enzymes: No results for input(s): CKTOTAL, CKMB, CKMBINDEX, TROPONINI in the last 168 hours.  HbA1C: Hgb A1c MFr Bld  Date/Time Value Ref Range Status  01/05/2020 12:11 PM 6.0 (H) 4.8 - 5.6 % Final    Comment:    (NOTE)         Prediabetes: 5.7 - 6.4         Diabetes: >6.4         Glycemic control for adults with diabetes: <7.0     CBG: Recent Labs  Lab 01/16/20 1933 01/16/20 2316 01/17/20 0314 01/17/20 0727 01/17/20 1123  GLUCAP 125* 154* 139* 125* 155*     Jacky Kindle MD Critical care physician [place]  Pager: [phone] Mobile: [phone]

## 2020-01-18 LAB — GLUCOSE, CAPILLARY
Glucose-Capillary: 135 mg/dL — ABNORMAL HIGH (ref 70–99)
Glucose-Capillary: 146 mg/dL — ABNORMAL HIGH (ref 70–99)
Glucose-Capillary: 106 mg/dL — ABNORMAL HIGH (ref 70–99)
Glucose-Capillary: 127 mg/dL — ABNORMAL HIGH (ref 70–99)
Glucose-Capillary: 130 mg/dL — ABNORMAL HIGH (ref 70–99)

## 2020-01-18 NOTE — Progress Notes (Signed)
Inpatient Rehab Admissions Coordinator:   I met with pt. And her husband at bedside for follow up questions regarding potential CIR admit. I notified them that I do not have a bed available today and do not yet have authorization from Pt.'s insurance. Pt.'s husband stated that he is eager to get her CIR so that she can begin intensive therapy. Will continue to follow for potential admit this week, pending bed availability and insurance authorization.   Clemens Catholic, [place], [place] Admissions Coordinator  [phone] ([place]) [phone] (office)

## 2020-01-18 NOTE — Progress Notes (Signed)
  Speech Language Pathology Treatment: Dysphagia;Cognitive-Linquistic  Patient Details Name: Erika Stanton MRN: [id number] DOB: [birth year] Today's Date: 01/18/2020 Time: 2263-3354 SLP Time Calculation (min) (ACUTE ONLY): 12 min  Assessment / Plan / Recommendation Clinical Impression  Pt is more responsive to questioning and opens her eyes briefly throughout session. Her responses are very unintelligible, so accuracy is difficult to assess, but when given yes/no questions or binary choices she can correctly demonstrate orientation to location, month, and year. Pt consumed a small amount of ice chips and water with more appropriate oral acceptance and less oral holding, but with strong and prolonged cough responses. Recommend that she remain NPO. If her mentation continues to improve, she would be appropriate for MBS to better evaluate oropharyngeal swallowing.    HPI HPI: Pt is a 64 yo female presenting with acute headache and L hemiplegia. CT Head was positive for acute ICH at the R basal ganglia with small volume of SAH and IVH and trace leftward midline shift. CTA revealed a large ruptured R ICA terminus aneurysm and smaller unruptured left ICA terminus aneurysm. No known PMH.       SLP Plan  Continue with current plan of care       Recommendations  Diet recommendations: NPO Medication Administration: Via alternative means                Oral Care Recommendations: Oral care QID Follow up Recommendations: Inpatient Rehab SLP Visit Diagnosis: Dysphagia, oropharyngeal phase (R13.12);Cognitive communication deficit (R41.841) Plan: Continue with current plan of care       GO                Osie Bond., M.A. [place] [facility] Pager [phone] Office [phone]  01/18/2020, 12:27 PM

## 2020-01-18 NOTE — Progress Notes (Signed)
   Providing Compassionate, Quality Care - Together  NEUROSURGERY PROGRESS NOTE   S: No issues overnight.   O: EXAM:  BP (!) 163/89 (BP Location: Left Arm)   Pulse (!) 102   Temp 99 F (37.2 C)   Resp 18   Ht 5\' 6"  (1.676 m)   Wt 82.4 kg   SpO2 99%   BMI 29.32 kg/m   Drowsy, responds to voice, eyes open Moans incomprehensible sounds PERRLA Followed commands BLE and LUE RUE flaccid  ASSESSMENT:  64 y.o. female with  SAH D#13POD#12stent-coil of large RICA terminus aneurysm.Stable neurologically. - continue supportive care, keppra, nimotop - continue asa, plavix - CIR pending -tcd pending    Thank you for allowing me to participate in this patient's care.  Please do not hesitate to call with questions or concerns.   Elwin Sleight, [facility] Cell: [phone]

## 2020-01-18 NOTE — Progress Notes (Signed)
Physical Therapy Treatment Patient Details Name: Erika Stanton MRN: [id number] DOB: 1955/09/06 Today's Date: 01/18/2020    History of Present Illness 64 y.o. female with no past medical history.  Per husband patient had walked outside then came in to the house complaining of a headache at 9 10 in the morning.  She then had gone into the shower and called for husband due to left arm and left leg weakness. 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 Pt underwent coil embolization of RICA aneurysm on 8/25 with additional finding of LICA aneurysm.    PT Comments    Pt remains lethargic with head resting in L cervical rotation. Pt grimacing with PROM/stretching to neutral in supine and sitting. Pt continues to require maxAx2 to transfer to EOB but was able to maintain EOB sitting with close min guard/minA. Pt able to follow commands with bilat LEs however demo's increased flexor tone in L UE while R UE remains flaccid. Pt was able to transfer sit to stand, std pvt to chair today with mod/maxAX2, Pt powered up well and was able to take steps with assist to chair. Cont to recommend CIR Upon d/c for maximal functional recovery. Acute PT to cont to follow.    Follow Up Recommendations  CIR     Equipment Recommendations  Wheelchair (measurements PT);Wheelchair cushion (measurements PT);Hospital bed (mechanical lift, all if home today)    Recommendations for Other Services Rehab consult     Precautions / Restrictions Precautions Precautions: Fall Precaution Comments: R UE flaccid Restrictions Weight Bearing Restrictions: No    Mobility  Bed Mobility Overal bed mobility: Needs Assistance Bed Mobility: Rolling;Sidelying to Sit Rolling: +2 for physical assistance;Max assist Sidelying to sit: +2 for physical assistance;Max assist       General bed mobility comments: max directional verbal cues, hand over hand assist with L UE to pull on rail to roll to the R. maxA for LE  management off bed and maxA for trunk elevation  Transfers Overall transfer level: Needs assistance Equipment used: 2 person hand held assist (lift with bed pad) Transfers: Sit to/from Stand;Stand Pivot Transfers Sit to Stand: Max assist Stand pivot transfers: Max assist;+2 physical assistance       General transfer comment: pt powered up well however had difficulty advancing LEs/sequencing steps, modA to assist L LE advancement for std pvt to chair  Ambulation/Gait             General Gait Details: unable at this time   Stairs             Wheelchair Mobility    Modified Rankin (Stroke Patients Only) Modified Rankin (Stroke Patients Only) Pre-Morbid Rankin Score: No significant disability Modified Rankin: Severe disability     Balance Overall balance assessment: Needs assistance Sitting-balance support: Feet supported;Bilateral upper extremity supported Sitting balance-Leahy Scale: Poor Sitting balance - Comments: minG at edge of bed with BUE support Postural control: Left lateral lean Standing balance support: Bilateral upper extremity supported Standing balance-Leahy Scale: Zero Standing balance comment: mod-maxA with BUE support, pt able to maintain stand for 30 second intervals with PT support                            Cognition Arousal/Alertness: Lethargic (but awakens easily to stimuli) Behavior During Therapy: Flat affect Overall Cognitive Status: Impaired/Different from baseline Area of Impairment: Attention;Memory;Following commands;Safety/judgement;Problem solving;Awareness  Orientation Level: Disoriented to;Place;Time;Situation (pt responded to being called [name]) Current Attention Level: Focused Memory: Decreased recall of precautions;Decreased short-term memory Following Commands: Follows one step commands with increased time Safety/Judgement: Decreased awareness of safety;Decreased awareness of  deficits Awareness: Intellectual Problem Solving: Slow processing;Requires verbal cues;Requires tactile cues;Difficulty sequencing;Decreased initiation General Comments: pt lethargic with minimal eye opening but responsive to questions and will follow commands with bilat LEs      Exercises General Exercises - Upper Extremity Elbow Flexion: PROM;Right;AAROM;Left;10 reps Elbow Extension: PROM;Right;AAROM;Left;10 reps General Exercises - Lower Extremity Long Arc Quad: AROM;Both;10 reps;Seated Hip Flexion/Marching: AROM;Both;Seated (L required AA, appears to be weaker) Other Exercises Other Exercises: passive ROM to bilat UEs at shoulder and elbow    General Comments General comments (skin integrity, edema, etc.): pt with bruising t/o body, pt actively bleeding from needle stick in abdomen, RN notified and re-dressed it      Pertinent Vitals/Pain Pain Assessment: Faces Faces Pain Scale: Hurts little more Pain Location: moaning with L UE movement, stretching/ROM Pain Descriptors / Indicators: Moaning Pain Intervention(s): Monitored during session    Home Living                      Prior Function            PT Goals (current goals can now be found in the care plan section) Progress towards PT goals: Progressing toward goals    Frequency    Min 4X/week      PT Plan Frequency needs to be updated    Co-evaluation              AM-PAC PT "6 Clicks" Mobility   Outcome Measure  Help needed turning from your back to your side while in a flat bed without using bedrails?: Total Help needed moving from lying on your back to sitting on the side of a flat bed without using bedrails?: Total Help needed moving to and from a bed to a chair (including a wheelchair)?: Total Help needed standing up from a chair using your arms (e.g., wheelchair or bedside chair)?: Total Help needed to walk in hospital room?: Total Help needed climbing 3-5 steps with a railing? :  Total 6 Click Score: 6    End of Session Equipment Utilized During Treatment: Gait belt Activity Tolerance: Patient tolerated treatment well Patient left: with call bell/phone within reach;in chair;with chair alarm set Nurse Communication: Mobility status PT Visit Diagnosis: Other abnormalities of gait and mobility (R26.89);Muscle weakness (generalized) (M62.81);Other symptoms and signs involving the nervous system (R29.898);Pain Pain - part of body:  (head)     Time: 2841-3244 PT Time Calculation (min) (ACUTE ONLY): 30 min  Charges:  $Therapeutic Exercise: 8-22 mins $Therapeutic Activity: 8-22 mins                     Kittie Plater, PT, DPT Acute Rehabilitation Services Pager #: [phone] Office #: [phone]    Berline Lopes 01/18/2020, 2:35 PM

## 2020-01-19 ENCOUNTER — Inpatient Hospital Stay (HOSPITAL_BASED_OUTPATIENT_CLINIC_OR_DEPARTMENT_OTHER): Payer: BC Managed Care – PPO

## 2020-01-19 DIAGNOSIS — R569 Unspecified convulsions: Secondary | ICD-10-CM | POA: Diagnosis not present

## 2020-01-19 LAB — GLUCOSE, CAPILLARY
Glucose-Capillary: 123 mg/dL — ABNORMAL HIGH (ref 70–99)
Glucose-Capillary: 124 mg/dL — ABNORMAL HIGH (ref 70–99)
Glucose-Capillary: 125 mg/dL — ABNORMAL HIGH (ref 70–99)
Glucose-Capillary: 133 mg/dL — ABNORMAL HIGH (ref 70–99)
Glucose-Capillary: 133 mg/dL — ABNORMAL HIGH (ref 70–99)
Glucose-Capillary: 141 mg/dL — ABNORMAL HIGH (ref 70–99)
Glucose-Capillary: 151 mg/dL — ABNORMAL HIGH (ref 70–99)

## 2020-01-19 NOTE — Progress Notes (Addendum)
  NEUROSURGERY PROGRESS NOTE   No issues overnight.   EXAM:  BP 138/84 (BP Location: Left Arm)   Pulse (!) 106   Temp 98.7 F (37.1 C)   Resp 19   Ht 5\' 6"  (1.676 m)   Wt 89.5 kg   SpO2 98%   BMI 31.85 kg/m   Drowsy, easily awakens Incomprehensible speech at times, but is oriented to self and year Follows simple commands with moving BLE, LUE RUE remains flaccid  IMPRESSION/PLAN 64 y.o. female SAHD#14POD#13stent-coil of large RICA terminus aneurysm.Stable neurologically. UTI - continue supportive care, keppra, nimotop - continue asa, plavix - TCDs - CIR pending - UTI: Rocephin day 5/5 today

## 2020-01-19 NOTE — Progress Notes (Signed)
Inpatient Rehabilitation Admissions Coordinator  I have received insurance approval for Cir admit and await bed availability. I have left spouse a Advertising account executive. I will follow up tomorrow.  Danne Baxter, RN, MSN Rehab Admissions Coordinator [phone] 01/19/2020 2:46 PM

## 2020-01-19 NOTE — Progress Notes (Signed)
Transcranial Doppler  Date POD PCO2 HCT BP  MCA ACA PCA OPHT SIPH VERT Basilar  8/26 MR     Right  Left   47  52   -34  *   *  19   20  20    42  28   *  *   *      8/27 MS     Right  Left   25  *   *  *   *  *   20  19   33  27   *  *   *      8/30 MR     Right  Left   57  66   -30  -25   20  43   18  25   40  40   -20  -41   -32      9/1 GC      Right  Left   59  74   -49  *   23  *   19  19   43  65   -26  -15   *      9/3 GC      Right  Left   52  47   *  -19   36  24   21  23    36  40   *  *   *      9/7 MR     Right  Left   37  47   -15  -38   21  21   20  24   29   54   -23  -24     -29         Right  Left                                        MCA = Middle Cerebral Artery      OPHT = Opthalmic Artery     BASILAR = Basilar Artery   ACA = Anterior Cerebral Artery     SIPH = Carotid Siphon PCA = Posterior Cerebral Artery   VERT = Verterbral Artery                   Normal MCA = 62+\-12 ACA = 50+\-12 PCA = 42+\-23  * Unable to insonate.  01/08/20- study was very technically limited due to excessive patient movement. 01/13/20 - Study was technically limited due to patient positioning and movement. 01/15/20 - Study was technically limited due to patient positioning and movement. 01/19/20-Study was technically limited due to patient positioning and movement.  Abram Sander 01/19/2020 11:49 AM

## 2020-01-19 NOTE — Progress Notes (Signed)
Inpatient Rehabilitation Admissions Coordinator  I have opened case with insurance this morning for a possible CIR admit.  Danne Baxter, RN, MSN Rehab Admissions Coordinator [phone] 01/19/2020 8:22 AM

## 2020-01-20 ENCOUNTER — Other Ambulatory Visit: Payer: Self-pay

## 2020-01-20 ENCOUNTER — Inpatient Hospital Stay (HOSPITAL_COMMUNITY)
Admission: RE | Admit: 2020-01-20 | Discharge: 2020-02-23 | DRG: 057 | Disposition: A | Discharge status: Home Health | Payer: BC Managed Care – PPO | Source: Intra-hospital | Attending: Physical Medicine & Rehabilitation | Admitting: Physical Medicine & Rehabilitation

## 2020-01-20 ENCOUNTER — Encounter (HOSPITAL_COMMUNITY): Payer: Self-pay | Admitting: Neurology

## 2020-01-20 ENCOUNTER — Encounter (HOSPITAL_COMMUNITY): Payer: Self-pay | Admitting: Physical Medicine & Rehabilitation

## 2020-01-20 ENCOUNTER — Other Ambulatory Visit (HOSPITAL_COMMUNITY): Payer: BC Managed Care – PPO

## 2020-01-20 DIAGNOSIS — E876 Hypokalemia: Secondary | ICD-10-CM | POA: Diagnosis not present

## 2020-01-20 DIAGNOSIS — R799 Abnormal finding of blood chemistry, unspecified: Secondary | ICD-10-CM

## 2020-01-20 DIAGNOSIS — D696 Thrombocytopenia, unspecified: Secondary | ICD-10-CM | POA: Diagnosis present

## 2020-01-20 DIAGNOSIS — I69322 Dysarthria following cerebral infarction: Secondary | ICD-10-CM

## 2020-01-20 DIAGNOSIS — I69318 Other symptoms and signs involving cognitive functions following cerebral infarction: Secondary | ICD-10-CM

## 2020-01-20 DIAGNOSIS — I607 Nontraumatic subarachnoid hemorrhage from unspecified intracranial artery: Secondary | ICD-10-CM | POA: Diagnosis present

## 2020-01-20 DIAGNOSIS — I67848 Other cerebrovascular vasospasm and vasoconstriction: Secondary | ICD-10-CM

## 2020-01-20 DIAGNOSIS — K922 Gastrointestinal hemorrhage, unspecified: Secondary | ICD-10-CM

## 2020-01-20 DIAGNOSIS — D62 Acute posthemorrhagic anemia: Secondary | ICD-10-CM | POA: Diagnosis present

## 2020-01-20 DIAGNOSIS — K2100 Gastro-esophageal reflux disease with esophagitis, without bleeding: Secondary | ICD-10-CM | POA: Diagnosis not present

## 2020-01-20 DIAGNOSIS — D500 Iron deficiency anemia secondary to blood loss (chronic): Secondary | ICD-10-CM | POA: Diagnosis not present

## 2020-01-20 DIAGNOSIS — Z882 Allergy status to sulfonamides status: Secondary | ICD-10-CM

## 2020-01-20 DIAGNOSIS — I69351 Hemiplegia and hemiparesis following cerebral infarction affecting right dominant side: Secondary | ICD-10-CM | POA: Diagnosis present

## 2020-01-20 DIAGNOSIS — R1310 Dysphagia, unspecified: Secondary | ICD-10-CM | POA: Diagnosis present

## 2020-01-20 DIAGNOSIS — I69391 Dysphagia following cerebral infarction: Secondary | ICD-10-CM

## 2020-01-20 DIAGNOSIS — N390 Urinary tract infection, site not specified: Secondary | ICD-10-CM | POA: Diagnosis present

## 2020-01-20 DIAGNOSIS — R7303 Prediabetes: Secondary | ICD-10-CM | POA: Diagnosis present

## 2020-01-20 DIAGNOSIS — I69319 Unspecified symptoms and signs involving cognitive functions following cerebral infarction: Secondary | ICD-10-CM | POA: Diagnosis not present

## 2020-01-20 DIAGNOSIS — K921 Melena: Secondary | ICD-10-CM | POA: Diagnosis not present

## 2020-01-20 DIAGNOSIS — B964 Proteus (mirabilis) (morganii) as the cause of diseases classified elsewhere: Secondary | ICD-10-CM | POA: Diagnosis present

## 2020-01-20 DIAGNOSIS — R1312 Dysphagia, oropharyngeal phase: Secondary | ICD-10-CM | POA: Diagnosis present

## 2020-01-20 DIAGNOSIS — K449 Diaphragmatic hernia without obstruction or gangrene: Secondary | ICD-10-CM | POA: Diagnosis present

## 2020-01-20 DIAGNOSIS — R000 Tachycardia, unspecified: Secondary | ICD-10-CM | POA: Diagnosis present

## 2020-01-20 DIAGNOSIS — Z87891 Personal history of nicotine dependence: Secondary | ICD-10-CM | POA: Diagnosis not present

## 2020-01-20 DIAGNOSIS — R159 Full incontinence of feces: Secondary | ICD-10-CM | POA: Diagnosis present

## 2020-01-20 DIAGNOSIS — Z8249 Family history of ischemic heart disease and other diseases of the circulatory system: Secondary | ICD-10-CM | POA: Diagnosis not present

## 2020-01-20 DIAGNOSIS — I639 Cerebral infarction, unspecified: Secondary | ICD-10-CM | POA: Diagnosis not present

## 2020-01-20 DIAGNOSIS — Z801 Family history of malignant neoplasm of trachea, bronchus and lung: Secondary | ICD-10-CM | POA: Diagnosis not present

## 2020-01-20 DIAGNOSIS — I611 Nontraumatic intracerebral hemorrhage in hemisphere, cortical: Secondary | ICD-10-CM

## 2020-01-20 DIAGNOSIS — I619 Nontraumatic intracerebral hemorrhage, unspecified: Secondary | ICD-10-CM | POA: Diagnosis present

## 2020-01-20 DIAGNOSIS — R195 Other fecal abnormalities: Secondary | ICD-10-CM | POA: Diagnosis not present

## 2020-01-20 DIAGNOSIS — R059 Cough, unspecified: Secondary | ICD-10-CM

## 2020-01-20 HISTORY — DX: Melena: K92.1

## 2020-01-20 LAB — BASIC METABOLIC PANEL
Anion gap: 8 (ref 5–15)
BUN: 18 mg/dL (ref 8–23)
CO2: 22 mmol/L (ref 22–32)
Calcium: 8 mg/dL — ABNORMAL LOW (ref 8.9–10.3)
Chloride: 106 mmol/L (ref 98–111)
Creatinine, Ser: 1.23 mg/dL — ABNORMAL HIGH (ref 0.44–1.00)
GFR calc Af Amer: 54 mL/min — ABNORMAL LOW (ref >60)
GFR calc non Af Amer: 47 mL/min — ABNORMAL LOW (ref >60)
Glucose, Bld: 77 mg/dL (ref 70–99)
Potassium: 3.8 mmol/L (ref 3.5–5.1)
Sodium: 136 mmol/L (ref 135–145)

## 2020-01-20 LAB — GLUCOSE, CAPILLARY
Glucose-Capillary: 142 mg/dL — ABNORMAL HIGH (ref 70–99)
Glucose-Capillary: 146 mg/dL — ABNORMAL HIGH (ref 70–99)
Glucose-Capillary: 142 mg/dL — ABNORMAL HIGH (ref 70–99)
Glucose-Capillary: 115 mg/dL — ABNORMAL HIGH (ref 70–99)
Glucose-Capillary: 166 mg/dL — ABNORMAL HIGH (ref 70–99)

## 2020-01-20 LAB — CBC
HCT: 25.2 % — ABNORMAL LOW (ref 36.0–46.0)
Hemoglobin: 8.3 g/dL — ABNORMAL LOW (ref 12.0–15.0)
MCH: 29.2 pg (ref 26.0–34.0)
MCHC: 32.9 g/dL (ref 30.0–36.0)
MCV: 88.7 fL (ref 80.0–100.0)
Platelets: 77 10*3/uL — ABNORMAL LOW (ref 150–400)
RBC: 2.84 MIL/uL — ABNORMAL LOW (ref 3.87–5.11)
RDW: 13.3 % (ref 11.5–15.5)
WBC: 5.8 10*3/uL (ref 4.0–10.5)
nRBC: 0 % (ref 0.0–0.2)

## 2020-01-20 MED ORDER — CHLORHEXIDINE GLUCONATE 0.12 % MT SOLN
15.0000 mL | Freq: Two times a day (BID) | OROMUCOSAL | Status: DC
  Administered 2020-01-20 – 2020-02-23 (×46): 15 mL via OROMUCOSAL
  Filled 2020-01-20 (×55): qty 15

## 2020-01-20 MED ORDER — PROSOURCE TF PO LIQD
45.0000 mL | Freq: Three times a day (TID) | ORAL | Status: DC
  Administered 2020-01-20 – 2020-01-21 (×3): 45 mL
  Filled 2020-01-20 (×4): qty 45

## 2020-01-20 MED ORDER — PANTOPRAZOLE SODIUM 40 MG PO PACK
40.0000 mg | PACK | Freq: Every day | ORAL | Status: DC
  Administered 2020-01-20 – 2020-01-25 (×6): 40 mg
  Filled 2020-01-20 (×5): qty 20

## 2020-01-20 MED ORDER — FLEET ENEMA 7-19 GM/118ML RE ENEM: 1.0000 | ENEMA | Freq: Once | RECTAL | Status: DC | PRN

## 2020-01-20 MED ORDER — PROCHLORPERAZINE 25 MG RE SUPP: 12.5000 mg | Freq: Four times a day (QID) | RECTAL | Status: DC | PRN

## 2020-01-20 MED ORDER — TRAZODONE HCL 50 MG PO TABS
25.0000 mg | ORAL_TABLET | Freq: Every evening | ORAL | Status: DC | PRN
  Administered 2020-01-25: 25 mg
  Filled 2020-01-20: qty 1

## 2020-01-20 MED ORDER — BETHANECHOL CHLORIDE 10 MG PO TABS
10.0000 mg | ORAL_TABLET | Freq: Three times a day (TID) | ORAL | Status: DC
  Administered 2020-01-20 – 2020-01-21 (×2): 10 mg
  Filled 2020-01-20 (×2): qty 1

## 2020-01-20 MED ORDER — FREE WATER
200.0000 mL | Freq: Three times a day (TID) | Status: DC
  Administered 2020-01-20 – 2020-01-21 (×3): 200 mL

## 2020-01-20 MED ORDER — PROCHLORPERAZINE MALEATE 5 MG PO TABS: 5.0000 mg | ORAL_TABLET | Freq: Four times a day (QID) | ORAL | Status: DC | PRN

## 2020-01-20 MED ORDER — CHLORHEXIDINE GLUCONATE CLOTH 2 % EX PADS
6.0000 | MEDICATED_PAD | Freq: Every day | CUTANEOUS | Status: DC
  Administered 2020-01-21 – 2020-01-26 (×5): 6 via TOPICAL

## 2020-01-20 MED ORDER — INSULIN ASPART 100 UNIT/ML ~~LOC~~ SOLN
0.0000 [IU] | SUBCUTANEOUS | Status: DC
  Administered 2020-01-20: 3 [IU] via SUBCUTANEOUS
  Administered 2020-01-21 – 2020-01-25 (×20): 2 [IU] via SUBCUTANEOUS
  Administered 2020-01-25 – 2020-01-26 (×4): 3 [IU] via SUBCUTANEOUS
  Administered 2020-01-26 – 2020-01-30 (×9): 2 [IU] via SUBCUTANEOUS
  Administered 2020-01-30 (×2): 3 [IU] via SUBCUTANEOUS
  Administered 2020-01-30 – 2020-02-05 (×14): 2 [IU] via SUBCUTANEOUS

## 2020-01-20 MED ORDER — HEPARIN SODIUM (PORCINE) 5000 UNIT/ML IJ SOLN
5000.0000 [IU] | Freq: Three times a day (TID) | INTRAMUSCULAR | Status: DC
  Administered 2020-01-20 – 2020-01-25 (×15): 5000 [IU] via SUBCUTANEOUS
  Filled 2020-01-20 (×17): qty 1

## 2020-01-20 MED ORDER — ASPIRIN 325 MG PO TABS
325.0000 mg | ORAL_TABLET | Freq: Every day | ORAL | Status: DC
  Administered 2020-01-21 – 2020-01-26 (×6): 325 mg
  Filled 2020-01-20 (×6): qty 1

## 2020-01-20 MED ORDER — JEVITY 1.2 CAL PO LIQD
1000.0000 mL | ORAL | Status: DC
  Administered 2020-01-20: 1000 mL
  Filled 2020-01-20 (×3): qty 1000

## 2020-01-20 MED ORDER — ACETAMINOPHEN 325 MG PO TABS
325.0000 mg | ORAL_TABLET | ORAL | Status: DC | PRN
  Administered 2020-01-23 – 2020-02-04 (×9): 650 mg
  Filled 2020-01-20 (×9): qty 2

## 2020-01-20 MED ORDER — DIPHENHYDRAMINE HCL 12.5 MG/5ML PO ELIX
12.5000 mg | ORAL_SOLUTION | Freq: Four times a day (QID) | ORAL | Status: DC | PRN
  Administered 2020-01-30: 25 mg
  Filled 2020-01-20: qty 10

## 2020-01-20 MED ORDER — CLOPIDOGREL BISULFATE 75 MG PO TABS
75.0000 mg | ORAL_TABLET | Freq: Every day | ORAL | Status: DC
  Administered 2020-01-21 – 2020-01-26 (×6): 75 mg
  Filled 2020-01-20 (×6): qty 1

## 2020-01-20 MED ORDER — CLOPIDOGREL BISULFATE 75 MG PO TABS: 75.0000 mg | ORAL_TABLET | Freq: Every day | ORAL | 2 refills | Status: DC

## 2020-01-20 MED ORDER — ASPIRIN 325 MG PO TABS: 325.0000 mg | ORAL_TABLET | Freq: Every day | ORAL | 3 refills | Status: DC

## 2020-01-20 MED ORDER — ORAL CARE MOUTH RINSE
15.0000 mL | Freq: Two times a day (BID) | OROMUCOSAL | Status: DC
  Administered 2020-01-21 – 2020-02-21 (×32): 15 mL via OROMUCOSAL

## 2020-01-20 MED ORDER — SENNOSIDES-DOCUSATE SODIUM 8.6-50 MG PO TABS
1.0000 | ORAL_TABLET | Freq: Two times a day (BID) | ORAL | Status: DC
  Administered 2020-01-20 – 2020-01-21 (×2): 1
  Filled 2020-01-20 (×2): qty 1

## 2020-01-20 MED ORDER — POLYETHYLENE GLYCOL 3350 17 G PO PACK: 17.0000 g | PACK | Freq: Every day | ORAL | Status: DC | PRN

## 2020-01-20 MED ORDER — PROCHLORPERAZINE EDISYLATE 10 MG/2ML IJ SOLN: 5.0000 mg | Freq: Four times a day (QID) | INTRAMUSCULAR | Status: DC | PRN

## 2020-01-20 MED ORDER — ALUM & MAG HYDROXIDE-SIMETH 200-200-20 MG/5ML PO SUSP: 30.0000 mL | ORAL | Status: DC | PRN

## 2020-01-20 MED ORDER — HEPARIN SODIUM (PORCINE) 5000 UNIT/ML IJ SOLN: 5000.0000 [IU] | Freq: Three times a day (TID) | INTRAMUSCULAR | Status: DC

## 2020-01-20 MED ORDER — NIMODIPINE 6 MG/ML PO SOLN
60.0000 mg | ORAL | Status: AC
  Administered 2020-01-20 – 2020-01-25 (×30): 60 mg
  Filled 2020-01-20 (×31): qty 10

## 2020-01-20 MED ORDER — BISACODYL 10 MG RE SUPP
10.0000 mg | Freq: Every day | RECTAL | Status: DC | PRN
  Administered 2020-01-31 – 2020-02-07 (×2): 10 mg via RECTAL
  Filled 2020-01-20 (×3): qty 1

## 2020-01-20 MED ORDER — GUAIFENESIN-DM 100-10 MG/5ML PO SYRP: 5.0000 mL | ORAL_SOLUTION | Freq: Four times a day (QID) | ORAL | Status: DC | PRN

## 2020-01-20 MED ORDER — NIMODIPINE 6 MG/ML PO SOLN: 60.0000 mg | ORAL | 0 refills | Status: DC

## 2020-01-20 MED ORDER — SODIUM CHLORIDE 0.9 % IV SOLN
2.0000 g | INTRAVENOUS | Status: DC
  Administered 2020-01-21: 2 g via INTRAVENOUS
  Filled 2020-01-20: qty 2

## 2020-01-20 NOTE — Progress Notes (Signed)
Occupational Therapy Treatment Patient Details Name: Erika Stanton MRN: [id number] DOB: 07-Sep-1955 Today's Date: 01/20/2020    History of present illness 64 y.o. female with no past medical history.  Per husband patient had walked outside then came in to the house complaining of a headache at 9 10 in the morning.  She then had gone into the shower and called for husband due to left arm and left leg weakness. 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 Pt underwent coil embolization of RICA aneurysm on 8/25 with additional finding of LICA aneurysm.   OT comments  Pt remains total A for ADL at this time. She performed hand over hand grooming (washing face) and exclaimed "ah" and smiled. Pt max A +2 for bed mobility today and max A +2 for sit to stand utilizing pad. Improved power up from bed. Pt also able to perform SPT to recliner with max A +2 with assist for LE lateral progression. OT will continue to follow acutely. POC remains appropriate. Next session if possible, please plan on getting to chair and attempting to comb out hair (may want to bring shower cap).   Follow Up Recommendations  CIR;Supervision/Assistance - 24 hour    Equipment Recommendations  None recommended by OT (defer to next venue of care)    Recommendations for Other Services Rehab consult    Precautions / Restrictions Precautions Precautions: Fall Precaution Comments: R UE flaccid Restrictions Weight Bearing Restrictions: No       Mobility Bed Mobility Overal bed mobility: Needs Assistance Bed Mobility: Supine to Sit     Supine to sit: Max assist;+2 for physical assistance;+2 for safety/equipment;HOB elevated     General bed mobility comments: max directional cues, Pt able to move legs to center of bed, but req uired max A +2 for all other aspects  Transfers Overall transfer level: Needs assistance Equipment used: 2 person hand held assist (lift with bed pad) Transfers: Sit to/from  [employer] Transfers Sit to Stand: Max assist;+2 safety/equipment;+2 physical assistance Stand pivot transfers: Max assist;+2 physical assistance       General transfer comment: pt powered up well with pad assist, required assist to advance LLE for pivot, noted less left lateral lean in standing    Balance Overall balance assessment: Needs assistance Sitting-balance support: Feet supported;Bilateral upper extremity supported Sitting balance-Leahy Scale: Poor Sitting balance - Comments: mod A at edge of bed with BUE support Postural control: Left lateral lean Standing balance support: Bilateral upper extremity supported Standing balance-Leahy Scale: Zero Standing balance comment: mod-maxA with BUE support, pt able to maintain stand for 30 second intervals with therapist support                           ADL either performed or assessed with clinical judgement   ADL Overall ADL's : Needs assistance/impaired Eating/Feeding: NPO                                     General ADL Comments: Pt requires total A for all aspects.  unable to assist at this time      Vision   Additional Comments: did not track past midline towards the right   Perception     Praxis      Cognition Arousal/Alertness: Awake/alert Behavior During Therapy: Flat affect Overall Cognitive Status: Impaired/Different from baseline Area of Impairment: Attention;Memory;Following commands;Safety/judgement;Problem  solving;Awareness                 Orientation Level: Disoriented to;Place;Time;Situation (pt responded to being called Erika Stanton) Current Attention Level: Focused Memory: Decreased recall of precautions;Decreased short-term memory Following Commands: Follows one step commands with increased time Safety/Judgement: Decreased awareness of safety;Decreased awareness of deficits Awareness: Intellectual Problem Solving: Slow processing;Requires verbal cues;Requires tactile  cues;Difficulty sequencing;Decreased initiation General Comments: Pt more alert, anwering and mermering with questions (untelligible)        Exercises Other Exercises Other Exercises: PROM for head and neck stretching   Shoulder Instructions       General Comments      Pertinent Vitals/ Pain       Pain Assessment: Faces Faces Pain Scale: Hurts a little bit Pain Location: moaning with L UE movement, stretching/ROM of head and neck Pain Descriptors / Indicators: Moaning;Grimacing Pain Intervention(s): Monitored during session;Repositioned  Home Living                                          Prior Functioning/Environment              Frequency  Min 2X/week        Progress Toward Goals  OT Goals(current goals can now be found in the care plan section)  Progress towards OT goals: Progressing toward goals  Acute Rehab OT Goals Patient Stated Goal: to get stronger OT Goal Formulation: Patient unable to participate in goal setting Time For Goal Achievement: 01/23/20 Potential to Achieve Goals: Good  Plan Discharge plan remains appropriate    Co-evaluation    PT/OT/SLP Co-Evaluation/Treatment: Yes Reason for Co-Treatment: Complexity of the patient's impairments (multi-system involvement);For patient/therapist safety;To address functional/ADL transfers PT goals addressed during session: Mobility/safety with mobility;Balance;Strengthening/ROM OT goals addressed during session: ADL's and self-care;Strengthening/ROM      AM-PAC OT "6 Clicks" Daily Activity     Outcome Measure   Help from another person eating meals?: Total Help from another person taking care of personal grooming?: Total Help from another person toileting, which includes using toliet, bedpan, or urinal?: Total Help from another person bathing (including washing, rinsing, drying)?: Total Help from another person to put on and taking off regular upper body clothing?: Total Help  from another person to put on and taking off regular lower body clothing?: Total 6 Click Score: 6    End of Session Equipment Utilized During Treatment: Gait belt  OT Visit Diagnosis: Unsteadiness on feet (R26.81);Muscle weakness (generalized) (M62.81);Cognitive communication deficit (R41.841);Hemiplegia and hemiparesis Symptoms and signs involving cognitive functions: Nontraumatic SAH Hemiplegia - Right/Left: Right Hemiplegia - dominant/non-dominant: Dominant Hemiplegia - caused by: Nontraumatic SAH   Activity Tolerance Patient tolerated treatment well   Patient Left in chair;with call bell/phone within reach;with chair alarm set   Nurse Communication Mobility status        Time: 1324-4010 OT Time Calculation (min): 35 min  Charges: OT General Charges $OT Visit: 1 Visit OT Treatments $Self Care/Home Management : 8-22 mins  Jesse Sans OTR/L Acute Rehabilitation Services Pager: [phone] Office: [place] 01/20/2020, 10:12 AM

## 2020-01-20 NOTE — Progress Notes (Addendum)
Inpatient Rehabilitation Admissions Coordinator  I have insurance approval and CIR bed available to admit patient to today. I spoke with her spouse by phone and he is in agreement to admit. I will contact Vinnie Costello, Pa , acute team and TOC team to make the arrangements to admit today.Patient's sister at bedside for today.  Danne Baxter, RN, MSN Rehab Admissions Coordinator [phone] 01/20/2020 10:59 AM   I spoke with Vinnie, PA and will make the arrangements to admit today.  Danne Baxter, RN, MSN Rehab Admissions Coordinator [phone] 01/20/2020 11:21 AM

## 2020-01-20 NOTE — Progress Notes (Signed)
  NEUROSURGERY PROGRESS NOTE   No issues overnight. No concerns this am  EXAM:  BP 129/78 (BP Location: Right Arm)   Pulse (!) 110   Temp 98.8 F (37.1 C) (Oral)   Resp 19   Ht 5\' 6"  (1.676 m)   Wt 89.5 kg   SpO2 95%   BMI 31.85 kg/m   Drowsy, easily awakened Oriented to self and year Follows commands well with BLE, LUE ?movement RUE with elbow flexion 2/5  IMPRESSION/PLAN 64 y.o. female SAHD#15POD#14stent-coil of large RICA terminus aneurysm.Stable neurologically.  - continue supportive care, keppra, nimotop - continue asa, plavix - TCDs without evidence of vasospasm. Can d/c - CIRpending - UTI: treated for 5 days with rocephin

## 2020-01-20 NOTE — TOC Transition Note (Signed)
Transition of Care (TOC) - CM/SW Discharge Note Marvetta Gibbons RN,BSN Transitions of Care Unit 4NP (non trauma) - RN Case Manager See Treatment Team for direct Phone #   Patient Details  Name: Erika Stanton MRN: [id number] Date of Birth: 09-Feb-1956  Transition of Care [facility]) CM/SW Contact:  Dawayne Patricia, RN Phone Number: 01/20/2020, 2:09 PM   Clinical Narrative:    Pt stable for transition to [place] rehab today- insurance Josem Kaufmann has been received and per [facility] bed available today for INPT rehab admit. Pt and spouse agreeable. Pt will transfer over to [facility] later today.    Final next level of care: IP Rehab Facility Barriers to Discharge: No Barriers Identified   Patient Goals and CMS Choice Patient states their goals for this hospitalization and ongoing recovery are:: rehab CMS Medicare.gov Compare Post Acute Care list provided to:: Patient Represenative (must comment) Choice offered to / list presented to : Spouse  Discharge Placement                INPT rehab- [facility]        Discharge Plan and Services In-house Referral: NA Discharge Planning Services: CM Consult Post Acute Care Choice: IP Rehab          DME Arranged: N/A DME Agency: NA       HH Arranged: NA HH Agency: NA        Social Determinants of Health (SDOH) Interventions     Readmission Risk Interventions Readmission Risk Prevention Plan 01/20/2020  PCP or Specialist Appt within 5-7 Days (No Data)  Home Care Screening Complete  Medication Review (RN CM) Complete  Some recent data might be hidden

## 2020-01-20 NOTE — Progress Notes (Signed)
Inpatient Rehabilitation Medication Review by a Pharmacist  A complete drug regimen review was completed for this patient to identify any potential clinically significant medication issues.  Clinically significant medication issues were identified:  no  Check AMION for pharmacist assigned to patient if future medication questions/issues arise during this admission.  Pharmacist comments:   Time spent performing this drug regimen review (minutes):  < 16min   Resha Filippone S. Alford Highland, PharmD, BCPS Clinical Staff Pharmacist [email or web address]

## 2020-01-20 NOTE — Discharge Summary (Signed)
  Physician Discharge Summary  Patient ID: Erika Stanton MRN: [id number] DOB/AGE: Apr 05, 1956 64 y.o.  Admit date: 01/05/2020 Discharge date: 01/20/2020  Admission Diagnoses:  Ruptured right carotid terminus aneurysm CVA UTI  Discharge Diagnoses:  Same Active Problems:   ICH (intracerebral hemorrhage) ([place])   Brain aneurysm  Discharged Condition: Stable  Hospital Course:  Erika Stanton is a 64 y.o. female  Who presented to the emergency room on 01/05/2020 after acute onset headache and left hemiplegia.  She underwent CT head and ultimately CTA which revealed a  Ruptured large right ICA terminus aneurysm as well as  Unruptured left ICA terminus aneurysm.  She was admitted to the neuro ICU for further workup and management.  The following morning she was noted to have improved strength on the left side although still densely hemiparetic.  She underwent diagnostic cerebral angiogram  And ultimately stent supported coil embolization of the ruptured right ICA terminus aneurysm on 01/05/2020 by Dr Kathyrn Sheriff.  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  She was also noted to have a UTI with which was treated with a 5 day course of Rocephin.  She underwent serial transcranial Dopplers which were without evidence of vasospasm.   She will need to complete 21 day course of Nimotop for vasospasm prevention.  She continued to improve and work with both Physical and Occupational therapy.  [facility]  She was discharged on postop day 14 in hemodynamically stable condition.  Treatments: Surgery Diagnostic cerebral angiogram, stent-supported coil embolization of RICA aneurysm  Discharge Exam: Blood pressure 140/82, pulse 95, temperature 98.3 F (36.8 C), temperature source Axillary, resp. rate 18, height 5\' 6"  (1.676 m), weight 89.5 kg, SpO2 95  %. Awake, alert, oriented Speech fluent, appropriate CN grossly intact 5/5 BUE/BLE Wound c/d/i  Disposition: Discharge disposition: [place] Not Defined       Discharge Instructions    No wound care   Complete by: As directed      Allergies as of 01/20/2020      Reactions   Sulfa Antibiotics Rash      Medication List    TAKE these medications   aspirin 325 MG tablet Place 1 tablet (325 mg total) into feeding tube daily. Start taking on: January 21, 2020   clopidogrel 75 MG tablet Commonly known as: PLAVIX Place 1 tablet (75 mg total) into feeding tube daily. Start taking on: January 21, 2020   niMODipine 6 MG/ML Soln Commonly known as: NYMALIZE Place 10 mLs (60 mg total) into feeding tube every 4 (four) hours.       Follow-up Information    Consuella Lose, MD Follow up.   Specialty: Neurosurgery Contact information: [address] [place] [address] [phone]               Signed: Traci Sermon 01/20/2020, 12:06 PM

## 2020-01-20 NOTE — H&P (Signed)
Physical Medicine and Rehabilitation Admission H&P    Chief Complaint  Patient presents with  . Functional deficits due to aneurysm rupture    HPI:  Erika Stanton is a 64 year old female in relatively good health who was admitted on 01/05/2020 with right-sided headache and left hemiparesis.  History taken from chart review, nursing, and sister due to mentation.  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 Dr. Kathyrn Sheriff consulted and patient underwent stent supported embolization of R-ICA aneurysm on 01/06/2020.  Post op with lethargy and maintained on nimotop and keppra. EEG done showed evidence of epileptogenicity arising from right temporal region as well as severe diffuse encephalopathy but no seizures. On ASA/Plavix due to stent.   She developed obtundation and follow up MRI brain done revealing development of acute cortical and subcortical infarct affecting left anterior frontal and left parietal cortex as well as punctate microemboli in right frontal and parietal regions. Slight increase in vasogenic edema noted without hydrocephalus. LT-EEG showed left cortical dysfunction felt to be due to underlying stroke- no seizures and Keppra was dced on 01/17/2020.  Nimotop added due to concerns of vasospasms and tube feeds started for nutritional support. As mentation improved, patient noted to have flaccid RUE with decrease in verbal output. She remains NPO due to signs of aspiration--MBS pending, has cognitive deficits with disorientation, unintelligible speech and bouts of lethargy. Therapy ongoing and patient required assistance to advance LLE for pregait activity. CIR recommended due to functional decline.  Please see preadmission assessment from earlier today as well.  Review of Systems  Unable to perform ROS: Mental acuity    History  reviewed. No pertinent past medical history.  Unable to obtain from patient.  Past Surgical History:  Procedure Laterality Date  . CHOLECYSTECTOMY    . IR 3D INDEPENDENT WKST  01/06/2020  . IR ANGIO INTRA EXTRACRAN SEL INTERNAL CAROTID BILAT MOD SED  01/06/2020  . IR ANGIO VERTEBRAL SEL VERTEBRAL UNI L MOD SED  01/06/2020  . IR ANGIOGRAM FOLLOW UP STUDY  01/06/2020  . IR ANGIOGRAM FOLLOW UP STUDY  01/06/2020  . IR ANGIOGRAM FOLLOW UP STUDY  01/06/2020  . IR ANGIOGRAM FOLLOW UP STUDY  01/06/2020  . IR ANGIOGRAM FOLLOW UP STUDY  01/06/2020  . IR ANGIOGRAM FOLLOW UP STUDY  01/06/2020  . IR ANGIOGRAM FOLLOW UP STUDY  01/06/2020  . IR ANGIOGRAM FOLLOW UP STUDY  01/06/2020  . IR ANGIOGRAM FOLLOW UP STUDY  01/06/2020  . IR ANGIOGRAM FOLLOW UP STUDY  01/06/2020  . IR NEURO EACH ADD'L AFTER BASIC UNI RIGHT (MS)  01/06/2020  . IR TRANSCATH/EMBOLIZ  01/06/2020  . RADIOLOGY WITH ANESTHESIA N/A 01/06/2020   Procedure: IR WITH ANESTHESIA FOR ANEURYSM;  Surgeon: Consuella Lose, MD;  Location: [facility];  Service: Radiology;  Laterality: N/A;  . TUBAL LIGATION      Family History  Problem Relation Age of Onset  . Cancer Mother   . Congestive Heart Failure Mother   . Lung cancer Father   . Lung cancer Sister      Social History:  Married. Works as an Optometrist for [employer]. Independent without AD. She quit smoking 10+ years ago and uses alcohol occasionally?    Allergies  Allergen Reactions  . Sulfa Antibiotics Rash   No medications prior to admission.    Drug  Regimen Review  Drug regimen was reviewed and remains appropriate with no significant issues identified  Home: Home Living Family/patient expects to be discharged to:: Private residence Living Arrangements: Spouse/significant other Available Help at Discharge: Family, Available 24 hours/day Type of Home: House Home Access: Stairs to enter [employer] of Steps: 4 steps front and side entry Entrance Stairs-Rails: Right, Left, Can  reach both Home Layout: One level Bathroom Shower/Tub: Tub/shower unit, Air cabin crew Accessibility: Yes Additional Comments: patietn has used outpatient therapy in recent months  Lives With: Spouse   Functional History: Prior Function Level of Independence: Independent Comments: per chart review  Functional Status:  Mobility: Bed Mobility Overal bed mobility: Needs Assistance Bed Mobility: Supine to Sit Rolling: +2 for physical assistance, Max assist Sidelying to sit: +2 for physical assistance, Max assist Supine to sit: Max assist, +2 for physical assistance, +2 for safety/equipment, HOB elevated Sit to supine: Max assist General bed mobility comments: max directional cues, Pt able to move legs to center of bed, but req uired max A +2 for all other aspects Transfers Overall transfer level: Needs assistance Equipment used: 2 person hand held assist (lift with bed pad) Transfers: Sit to/from Stand, Stand Pivot Transfers Sit to Stand: Max assist, +2 safety/equipment, +2 physical assistance Stand pivot transfers: Max assist, +2 physical assistance General transfer comment: pt powered up well with pad assist, required assist to advance LLE for pivot, noted less left lateral lean in standing Ambulation/Gait General Gait Details: unable at this time    ADL: ADL Overall ADL's : Needs assistance/impaired Eating/Feeding: NPO General ADL Comments: Pt requires total A for all aspects.  unable to assist at this time   Cognition: Cognition Overall Cognitive Status: Impaired/Different from baseline Arousal/Alertness: Lethargic Orientation Level: Oriented to person, Oriented to place, Disoriented to time, Disoriented to situation Attention: Focused Focused Attention: Impaired Focused Attention Impairment: Verbal basic Problem Solving: Impaired Problem Solving Impairment: Functional basic Safety/Judgment: Impaired Cognition Arousal/Alertness:  Awake/alert Behavior During Therapy: Flat affect Overall Cognitive Status: Impaired/Different from baseline Area of Impairment: Attention, Memory, Following commands, Safety/judgement, Problem solving, Awareness Orientation Level: Disoriented to, Place, Time, Situation (pt responded to being called [name]) Current Attention Level: Focused Memory: Decreased recall of precautions, Decreased short-term memory Following Commands: Follows one step commands with increased time Safety/Judgement: Decreased awareness of safety, Decreased awareness of deficits Awareness: Intellectual Problem Solving: Slow processing, Requires verbal cues, Requires tactile cues, Difficulty sequencing, Decreased initiation General Comments: Pt more alert, anwering and mermering with questions (untelligible) Difficult to assess due to: Level of arousal   Blood pressure 140/82, pulse 95, temperature 98.3 F (36.8 C), temperature source Axillary, resp. rate 18, height 5\' 6"  (1.676 m), weight 89.5 kg, SpO2 95 %. Physical Exam Vitals and nursing note reviewed.  Constitutional:      General: She is not in acute distress.    Appearance: She is obese.     Comments: +Cortrack in place with neck flexed to the chest and head rotated to the left. Had discomfort with attempts to bring head to midline.  HENT:     Head: Normocephalic and atraumatic.     Right Ear: External ear normal.     Left Ear: External ear normal.     Nose: Nose normal.  Eyes:     General:        Right eye: No discharge.        Left eye: No discharge.     Comments: Left gaze preference  Cardiovascular:  Rate and Rhythm: Normal rate and regular rhythm.  Pulmonary:     Effort: Pulmonary effort is normal.     Breath sounds: No stridor. Rhonchi present.  Abdominal:     General: Abdomen is flat. Bowel sounds are normal. There is no distension.  Musculoskeletal:     Cervical back: Normal range of motion.     Comments: No edema or tenderness in  extremities  Skin:    General: Skin is warm and dry.     Comments: Multiple ecchymotic areas on left mid shin with mild erythema at lower aspect.  Few bruises noted on RLE and around ankle.    Neurological:     Mental Status: She is alert.     Comments: Somnolent/lethargic Grunting sounds at times with soft and muttering speech, dysarthria Able to state her name as Ivyonna, Hoelzel, age and partially state DOB. She was able to answer some biographic questions. Opens mouth minimally Motor: Limited due to participation Right upper extremity: Minimal movement noted Left upper extremity: >/2+/5 proximal distal Right lower extremity: Moving spontaneously Left lower extremity: Some spontaneous movement noted   Psychiatric:     Comments: Unable to assess due to mentation     Results for orders placed or performed during the hospital encounter of 01/05/20 (from the past 48 hour(s))  Glucose, capillary     Status: Abnormal   Collection Time: 01/18/20  3:48 PM  Result Value Ref Range   Glucose-Capillary 127 (H) 70 - 99 mg/dL    Comment: Glucose reference range applies only to samples taken after fasting for at least 8 hours.   Comment 1 Notify RN    Comment 2 Document in Chart   Glucose, capillary     Status: Abnormal   Collection Time: 01/18/20  8:35 PM  Result Value Ref Range   Glucose-Capillary 130 (H) 70 - 99 mg/dL    Comment: Glucose reference range applies only to samples taken after fasting for at least 8 hours.  Glucose, capillary     Status: Abnormal   Collection Time: 01/18/20 11:39 PM  Result Value Ref Range   Glucose-Capillary 124 (H) 70 - 99 mg/dL    Comment: Glucose reference range applies only to samples taken after fasting for at least 8 hours.  Glucose, capillary     Status: Abnormal   Collection Time: 01/19/20  4:22 AM  Result Value Ref Range   Glucose-Capillary 133 (H) 70 - 99 mg/dL    Comment: Glucose reference range applies only to samples taken after fasting for  at least 8 hours.  Glucose, capillary     Status: Abnormal   Collection Time: 01/19/20  7:44 AM  Result Value Ref Range   Glucose-Capillary 133 (H) 70 - 99 mg/dL    Comment: Glucose reference range applies only to samples taken after fasting for at least 8 hours.  Glucose, capillary     Status: Abnormal   Collection Time: 01/19/20 12:58 PM  Result Value Ref Range   Glucose-Capillary 141 (H) 70 - 99 mg/dL    Comment: Glucose reference range applies only to samples taken after fasting for at least 8 hours.  Glucose, capillary     Status: Abnormal   Collection Time: 01/19/20  4:37 PM  Result Value Ref Range   Glucose-Capillary 123 (H) 70 - 99 mg/dL    Comment: Glucose reference range applies only to samples taken after fasting for at least 8 hours.   Comment 1 Notify RN  Comment 2 Document in Chart   Glucose, capillary     Status: Abnormal   Collection Time: 01/19/20  7:44 PM  Result Value Ref Range   Glucose-Capillary 151 (H) 70 - 99 mg/dL    Comment: Glucose reference range applies only to samples taken after fasting for at least 8 hours.  Glucose, capillary     Status: Abnormal   Collection Time: 01/19/20 11:42 PM  Result Value Ref Range   Glucose-Capillary 125 (H) 70 - 99 mg/dL    Comment: Glucose reference range applies only to samples taken after fasting for at least 8 hours.  Basic metabolic panel     Status: Abnormal   Collection Time: 01/20/20  2:44 AM  Result Value Ref Range   Sodium 136 135 - 145 mmol/L   Potassium 3.8 3.5 - 5.1 mmol/L   Chloride 106 98 - 111 mmol/L   CO2 22 22 - 32 mmol/L   Glucose, Bld 77 70 - 99 mg/dL    Comment: Glucose reference range applies only to samples taken after fasting for at least 8 hours.   BUN 18 8 - 23 mg/dL   Creatinine, Ser 1.23 (H) 0.44 - 1.00 mg/dL   Calcium 8.0 (L) 8.9 - 10.3 mg/dL   GFR calc non Af Amer 47 (L) >60 mL/min   GFR calc Af Amer 54 (L) >60 mL/min   Anion gap 8 5 - 15    Comment: Performed at [facility] [address], [place], [place] [address]  CBC     Status: Abnormal   Collection Time: 01/20/20  2:44 AM  Result Value Ref Range   WBC 5.8 4.0 - 10.5 K/uL   RBC 2.84 (L) 3.87 - 5.11 MIL/uL   Hemoglobin 8.3 (L) 12.0 - 15.0 g/dL   HCT 25.2 (L) 36 - 46 %   MCV 88.7 80.0 - 100.0 fL   MCH 29.2 26.0 - 34.0 pg   MCHC 32.9 30.0 - 36.0 g/dL   RDW 13.3 11.5 - 15.5 %   Platelets 77 (L) 150 - 400 K/uL    Comment: REPEATED TO VERIFY PLATELET COUNT CONFIRMED BY SMEAR SPECIMEN CHECKED FOR CLOTS Immature Platelet Fraction may be clinically indicated, consider ordering this additional test [id number]    nRBC 0.0 0.0 - 0.2 %    Comment: Performed at [facility], [place] [address], [place], [place] [address]  Glucose, capillary     Status: Abnormal   Collection Time: 01/20/20  4:16 AM  Result Value Ref Range   Glucose-Capillary 142 (H) 70 - 99 mg/dL    Comment: Glucose reference range applies only to samples taken after fasting for at least 8 hours.  Glucose, capillary     Status: Abnormal   Collection Time: 01/20/20  8:08 AM  Result Value Ref Range   Glucose-Capillary 146 (H) 70 - 99 mg/dL    Comment: Glucose reference range applies only to samples taken after fasting for at least 8 hours.  Glucose, capillary     Status: Abnormal   Collection Time: 01/20/20 11:58 AM  Result Value Ref Range   Glucose-Capillary 142 (H) 70 - 99 mg/dL    Comment: Glucose reference range applies only to samples taken after fasting for at least 8 hours.   VAS [place] TRANSCRANIAL DOPPLER  Result Date: 01/19/2020  Transcranial Doppler Indications: Subarachnoid hemorrhage. Limitations: patient positioning/ movement Comparison Study: 01/15/20 previous Performing Technologist: Abram Sander RVS  Examination Guidelines: A complete evaluation includes B-mode  imaging, spectral Doppler, color Doppler, and power Doppler as needed of all accessible portions of each vessel. Bilateral testing is considered an integral part of a  complete examination. Limited examinations for reoccurring indications may be performed as noted.  +----------+-------------+----------+-----------+-------+ RIGHT TCD Right VM (cm)Depth (cm)PulsatilityComment +----------+-------------+----------+-----------+-------+ MCA           37.00                 1.15            +----------+-------------+----------+-----------+-------+ ACA          -15.00                 1.03            +----------+-------------+----------+-----------+-------+ Term ICA      37.00                 1.42            +----------+-------------+----------+-----------+-------+ PCA           21.00                 1.08            +----------+-------------+----------+-----------+-------+ Opthalmic     20.00                 1.23            +----------+-------------+----------+-----------+-------+ ICA siphon    29.00                 1.40            +----------+-------------+----------+-----------+-------+ Vertebral    -23.00                 1.02            +----------+-------------+----------+-----------+-------+  +----------+------------+----------+-----------+-------+ LEFT TCD  Left VM (cm)Depth (cm)PulsatilityComment +----------+------------+----------+-----------+-------+ MCA          47.00                 0.93            +----------+------------+----------+-----------+-------+ ACA          -38.00                0.75            +----------+------------+----------+-----------+-------+ Term ICA     37.00                 1.05            +----------+------------+----------+-----------+-------+ PCA          21.00                 1.64            +----------+------------+----------+-----------+-------+ Opthalmic    24.00                 1.12            +----------+------------+----------+-----------+-------+ ICA siphon   54.00                 1.10            +----------+------------+----------+-----------+-------+  Vertebral    -24.00                1.27            +----------+------------+----------+-----------+-------+  +------------+-------+-------+             VM cm/sComment +------------+-------+-------+ Dist Basilar-29.00         +------------+-------+-------+  Summary: This was a normal transcranial Doppler study, with normal flow direction and velocity of all identified vessels of the anterior and posterior circulations, with no evidence of stenosis, vasospasm or occlusion. There was no evidence of intracranial disease. *See table(s) above for TCD measurements and observations.  Diagnosing physician: Antony Contras MD Electronically signed by Antony Contras MD on 01/19/2020 at 4:42:25 PM.    Final        Medical Problem List and Plan: 1.  Right hemiparesis, dysphagia, cognitive deficits with disorientation, unintelligible speech lethargy secondary to bilateral intracranial hemorrhages with subsequent infarcts.  -patient may not shower  -ELOS/Goals: 24-28 days/Min A/supervision.  Admit to CIR 2.  Antithrombotics: -DVT/anticoagulation:  Pharmaceutical: Heparin--hold    Dopplers ordered to rule out DVT.   -antiplatelet therapy: DAPT 3. Pain Management: Tylenol prn.  4. Mood: LCSW to follow for evaluation and support when appropriate.   -antipsychotic agents: N/A 5. Neuropsych: This patient is not capable of making decisions on her own behalf. 6. Skin/Wound Care: routine pressure relief measures.  7. Fluids/Electrolytes/Nutrition: NPO with Jevity TF- increased to 70 cc/hr so can be held 4 hours for therapy. Added water flushes.  8. ABLA: Monitor H/H. Hgb trending down 11.1-->8.3.  CBC ordered tommorrow 9. Thrombocytopenia: Platelets have dropped from 274-->77 today. Monitor for signs of bleeding.  CBC ordered for tomorrow.  Will consider HIT panel. 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 Oral care qid. 11. Resting tachycardia: HR ranging at 100-110s likely due to debility/medical  issues. Monitor for now.   Monitor with increased exertion 12. Vasospasm prevention: To continue Nimotop for 7 additional days.   Bary Leriche, PA-C 01/20/2020  I have personally performed a face to face diagnostic evaluation, including, but not limited to relevant history and physical exam findings, of this patient and developed relevant assessment and plan.  Additionally, I have reviewed and concur with the physician assistant's documentation above.  Delice Lesch, MD, ABPMR

## 2020-01-20 NOTE — Progress Notes (Signed)
Patient arrived to unit via bed with two nurses. Patient presents lethargic but arousal and able to answer some questions.

## 2020-01-20 NOTE — Progress Notes (Signed)
Courtney Heys, MD  Physician  Physical Medicine and Rehabilitation  Consult Note      Signed  Date of Service:  01/12/2020 12:57 PM      Related encounter: ED to Hosp-Admission (Discharged) from 01/05/2020 in [facility] All Collapse All  Show:Clear all [x] Manual[x] Template[] Copied  Added by: [x] Angiulli, Lavon Paganini, PA-C[x] Courtney Heys, MD  [] Hover for details          Physical Medicine and Rehabilitation Consult Reason for Consult: Left side weakness Referring Physician: Dr. Kathyrn Sheriff     HPI: Erika Stanton is a 64 y.o. right-handed female on no prescription medications.  Per chart review patient lives with spouse.  Independent prior to admission.  Presented 01/05/2020 with right-sided headache and left-sided weakness.  CT/MRI showed parenchymal hemorrhage centered at the right basal ganglia adjacent white matter with sulcal subarachnoid intraventricular extension and mild regional mass-effect.  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  Admission chemistries potassium 3.3, calcium 1.10, SARS coronavirus negative, alcohol negative.  EEG negative for seizure.  Patient underwent diagnostic cerebral angiogram with successful stent supported coil embolization of large R ICA aneurysm.  Currently maintained on aspirin and Plavix therapy.  Keppra for seizure prophylaxis.  Patient was cleared to begin subcutaneous heparin for DVT prophylaxis.  Currently n.p.o. with alternative means of nutritional support.  Therapy evaluation completed with recommendations of physical medicine rehab consult.     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  Said she's asking frequently, when occ awake, for water.    Constantly moving R>L legs. Got ROS from husband    Review of Systems  Unable  to perform ROS: Acuity of condition  Constitutional: Negative for chills and fever.  HENT: Negative for hearing loss.   Eyes: Negative for blurred vision and double vision.  Respiratory: Negative for cough and shortness of breath.   Cardiovascular: Negative for chest pain and leg swelling.  Gastrointestinal: Positive for constipation. Negative for heartburn, nausea and vomiting.  Genitourinary: Negative for dysuria, flank pain and hematuria.  Musculoskeletal: Positive for myalgias.  Skin: Negative for rash.  Neurological: Positive for weakness and headaches.  All other systems reviewed and are negative.   History reviewed. No pertinent past medical history.      Past Surgical History:  Procedure Laterality Date  . IR 3D INDEPENDENT WKST   01/06/2020  . IR ANGIO INTRA EXTRACRAN SEL INTERNAL CAROTID BILAT MOD SED   01/06/2020  . IR ANGIO VERTEBRAL SEL VERTEBRAL UNI L MOD SED   01/06/2020  . IR ANGIOGRAM FOLLOW UP STUDY   01/06/2020  . IR ANGIOGRAM FOLLOW UP STUDY   01/06/2020  . IR ANGIOGRAM FOLLOW UP STUDY   01/06/2020  . IR ANGIOGRAM FOLLOW UP STUDY   01/06/2020  . IR ANGIOGRAM FOLLOW UP STUDY   01/06/2020  . IR ANGIOGRAM FOLLOW UP STUDY   01/06/2020  . IR ANGIOGRAM FOLLOW UP STUDY   01/06/2020  . IR ANGIOGRAM FOLLOW UP STUDY   01/06/2020  . IR ANGIOGRAM FOLLOW UP STUDY   01/06/2020  . IR ANGIOGRAM FOLLOW UP STUDY   01/06/2020  . IR NEURO EACH ADD'L AFTER BASIC UNI RIGHT (MS)   01/06/2020  . IR TRANSCATH/EMBOLIZ   01/06/2020  .  RADIOLOGY WITH ANESTHESIA N/A 01/06/2020    Procedure: IR WITH ANESTHESIA FOR ANEURYSM;  Surgeon: Consuella Lose, MD;  Location: [place];  Service: Radiology;  Laterality: N/A;    History reviewed. No pertinent family history. Social History:  has no history on file for tobacco use, alcohol use, and drug use. Allergies:      Allergies  Allergen Reactions  . Sulfa Antibiotics Rash    No medications prior to admission.      Home: Home Living Family/patient  expects to be discharged to:: Private residence Living Arrangements: Spouse/significant other Available Help at Discharge: Family (unsure on availability) Type of Home:  (unable to determine) Additional Comments: pt unable to report history 2/2 lethargy and impaired communication  Functional History: Prior Function Level of Independence: Independent Comments: per chart review Functional Status:  Mobility: Bed Mobility Overal bed mobility: Needs Assistance Bed Mobility: Rolling, Sidelying to Sit, Sit to Supine Rolling: Total assist, +2 for physical assistance Sidelying to sit: Total assist, +2 for physical assistance Supine to sit: Total assist, +2 for physical assistance Sit to supine: Total assist, +2 for physical assistance General bed mobility comments: pt with no active assist or initiation of task, dependent for trunk elevation and LE management Transfers Overall transfer level: Needs assistance Equipment used: 2 person hand held assist Transfers: Sit to/from Stand Sit to Stand: Max assist, +2 physical assistance General transfer comment: not tested Ambulation/Gait General Gait Details: unable at this time   ADL: ADL Overall ADL's : Needs assistance/impaired Eating/Feeding: NPO General ADL Comments: Pt requires total A for all aspects.  unable to assist at this time    Cognition: Cognition Overall Cognitive Status: Difficult to assess Orientation Level: Oriented to person, Oriented to place, Oriented to time, Disoriented to situation Cognition Arousal/Alertness: Lethargic Behavior During Therapy: Flat affect Overall Cognitive Status: Difficult to assess Area of Impairment: Orientation, Following commands, Problem solving Orientation Level: Disoriented to, Place, Time, Situation (pt stated her name) Current Attention Level: Focused Following Commands: Follows one step commands with increased time, Follows one step commands inconsistently Problem Solving: Slow  processing, Decreased initiation, Difficulty sequencing, Requires verbal cues, Requires tactile cues General Comments: pt more verbal today and maintained eyes half open t/o session. pt with improved command follow and was able to complete 10 reps of LE exercises but not with UEs and did not initiate any transfers Difficult to assess due to: Level of arousal   Blood pressure 139/78, pulse 76, temperature 98.3 F (36.8 C), temperature source Axillary, resp. rate 18, height 5\' 6"  (1.676 m), weight 82.3 kg, SpO2 97 %. Physical Exam Vitals and nursing note reviewed. Exam conducted with a chaperone present.  Constitutional:      Comments: Pt asleep, couldn't get to wake up fully, however constantly moving R>L legs- rolling back and forth; Husband on R side of bed, NAD  HENT:     Head: Normocephalic.     Comments: Has cortrak in place- couldn't /due to lethargy comply with facial commands    Right Ear: External ear normal.     Left Ear: External ear normal.     Nose: Nose normal. No congestion.     Mouth/Throat:     Mouth: Mucous membranes are dry.     Pharynx: Oropharynx is clear. No oropharyngeal exudate.  Eyes:     Comments: Eye closed the entire time- wouldn't/couldn't open eyes  Cardiovascular:     Comments: RRR- no M/R/G Pulmonary:     Comments: Coarse breath sounds- mainly  upper airway sounds- good air movement B/L Abdominal:     Comments: Soft, NT; ND, (+)normoactive BS  Musculoskeletal:     Cervical back: Normal range of motion and neck supple.     Comments: Couldn't comply with all commands Squeezed hand on L- wasn't severely weaker Couldn't squeeze on R constantly moving B/L R>L legs back and forth-  Can wiggle toes on L and R feet  Skin:    Comments: IVs in R and L forearms- look good   Neurological:     Comments: Patient is lethargic.  Decreased attention.  Difficult to keep awake during exam.  Minimal following commands.  Minimal verbal output mostly moaning.  No  speech, verbal output when I was in room- husband at bedside; kept eyes closed/asleep entire time- couldn't comply with majority of commands  Mild tone in R hand/wrist which appears to be earlier than expected  Psychiatric:     Comments: asleep        Lab Results Last 24 Hours       Results for orders placed or performed during the hospital encounter of 01/05/20 (from the past 24 hour(s))  Glucose, capillary     Status: Abnormal    Collection Time: 01/11/20  3:38 PM  Result Value Ref Range    Glucose-Capillary 145 (H) 70 - 99 mg/dL  Glucose, capillary     Status: Abnormal    Collection Time: 01/11/20  7:55 PM  Result Value Ref Range    Glucose-Capillary 120 (H) 70 - 99 mg/dL  CBC with Differential/Platelet     Status: Abnormal    Collection Time: 01/12/20  4:31 AM  Result Value Ref Range    WBC 9.9 4.0 - 10.5 K/uL    RBC 3.93 3.87 - 5.11 MIL/uL    Hemoglobin 11.9 (L) 12.0 - 15.0 g/dL    HCT 37.5 36 - 46 %    MCV 95.4 80.0 - 100.0 fL    MCH 30.3 26.0 - 34.0 pg    MCHC 31.7 30.0 - 36.0 g/dL    RDW 13.0 11.5 - 15.5 %    Platelets 326 150 - 400 K/uL    nRBC 0.0 0.0 - 0.2 %    Neutrophils Relative % 81 %    Neutro Abs 8.1 (H) 1.7 - 7.7 K/uL    Lymphocytes Relative 10 %    Lymphs Abs 1.0 0.7 - 4.0 K/uL    Monocytes Relative 7 %    Monocytes Absolute 0.7 0 - 1 K/uL    Eosinophils Relative 1 %    Eosinophils Absolute 0.1 0 - 0 K/uL    Basophils Relative 0 %    Basophils Absolute 0.0 0 - 0 K/uL    Immature Granulocytes 1 %    Abs Immature Granulocytes 0.06 0.00 - 0.07 K/uL  Glucose, capillary     Status: Abnormal    Collection Time: 01/12/20  7:58 AM  Result Value Ref Range    Glucose-Capillary 185 (H) 70 - 99 mg/dL  Glucose, capillary     Status: Abnormal    Collection Time: 01/12/20 12:03 PM  Result Value Ref Range    Glucose-Capillary 164 (H) 70 - 99 mg/dL       Imaging Results (Last 48 hours)  VAS [place] TRANSCRANIAL DOPPLER   Result Date: 01/11/2020  Transcranial  Doppler Indications: Subarachnoid hemorrhage. Comparison Study: previous 8/26 Performing Technologist: Abram Sander RVS  Examination Guidelines: A complete evaluation includes B-mode imaging, spectral Doppler, color Doppler, and power  Doppler as needed of all accessible portions of each vessel. Bilateral testing is considered an integral part of a complete examination. Limited examinations for reoccurring indications may be performed as noted.  +----------+-------------+----------+-----------+-------+ RIGHT TCD Right VM (cm)Depth (cm)PulsatilityComment +----------+-------------+----------+-----------+-------+ MCA           57.00                 1.15            +----------+-------------+----------+-----------+-------+ ACA          -30.00                 1.05            +----------+-------------+----------+-----------+-------+ Term ICA      45.00                 1.12            +----------+-------------+----------+-----------+-------+ PCA           20.00                 1.43            +----------+-------------+----------+-----------+-------+ Opthalmic     18.00                 1.68            +----------+-------------+----------+-----------+-------+ ICA siphon    40.00                 1.45            +----------+-------------+----------+-----------+-------+ Vertebral    -20.00                 1.23            +----------+-------------+----------+-----------+-------+  +----------+------------+----------+-----------+-------+ LEFT TCD  Left VM (cm)Depth (cm)PulsatilityComment +----------+------------+----------+-----------+-------+ MCA          66.00                 1.07            +----------+------------+----------+-----------+-------+ ACA          -25.00                1.18            +----------+------------+----------+-----------+-------+ Term ICA     35.00                 0.93            +----------+------------+----------+-----------+-------+  PCA          43.00                 0.87            +----------+------------+----------+-----------+-------+ Opthalmic    25.00                 1.41            +----------+------------+----------+-----------+-------+ ICA siphon   40.00                 1.36            +----------+------------+----------+-----------+-------+ Vertebral    -41.00                1.08            +----------+------------+----------+-----------+-------+  +------------+-------+-------+             VM cm/sComment +------------+-------+-------+ Prox Basilar-32.00         +------------+-------+-------+ Summary: This  was a normal transcranial Doppler study, with normal flow direction and velocity of all identified vessels of the anterior and posterior circulations, with no evidence of stenosis, vasospasm or occlusion. There was no evidence of intracranial disease.  *See table(s) above for TCD measurements and observations.  Diagnosing physician: Antony Contras MD Electronically signed by Antony Contras MD on 01/11/2020 at 1:41:20 PM.    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 B/L weakness? Was requiring restraints- has been stopped for now.  1. Does the need for close, 24 hr/day medical supervision in concert with the patient's rehab needs make it unreasonable for this patient to be served in a less intensive setting? Yes 2. Co-Morbidities requiring supervision/potential complications: dysphagia, impaired cognition; aphasia, B/L weakness, early tone in RUE 3. Due to bladder management, bowel management, safety, skin/wound care, disease management, medication administration, pain management and patient education, does the patient require 24 hr/day rehab nursing? Yes 4. Does the patient require coordinated care of a physician,  rehab nurse, therapy disciplines of PT, OT and SLP to address physical and functional deficits in the context of the above medical diagnosis(es)? Yes Addressing deficits in the following areas: balance, endurance, locomotion, strength, transferring, bowel/bladder control, bathing, dressing, feeding, grooming, toileting, cognition, speech, language and swallowing 5. Can the patient actively participate in an intensive therapy program of at least 3 hrs of therapy per day at least 5 days per week? Yes 6. The potential for patient to make measurable gains while on inpatient rehab is good 7. Anticipated functional outcomes upon discharge from inpatient rehab are min assist  with PT, min assist with OT, min assist with SLP. 8. Estimated rehab length of stay to reach the above functional goals is: ~ 3 weeks- hard to tell at this point 9. Anticipated discharge destination: Home 10. Overall Rehab/Functional Prognosis: good   RECOMMENDATIONS: This patient's condition is appropriate for continued rehabilitative care in the following setting: CIR Patient has agreed to participate in recommended program. Potentially Note that insurance prior authorization may be required for reimbursement for recommended care.   Comment:  1. Signed FMLA paperwork that husband was asking about to save primary physician from having to- FYI 2. If BP would allow and primary team ok, pt might benefit from low dose Ritalin 2.5-5 mg BID at 8am/noon could help with waking pt up and attention- might be appropriate in the next week or so. Or, could try Amantadine liquid 100 mg daily x 4 days then 200 mg daily- to help with initiation.  3. Not quite ready since in ICU as well as pt not awake enough to do 3 hours/therapy yet- however will follow via Admissions coordinators to see when appropriate.  4. Thank you for this consult.          Cathlyn Parsons, PA-C 01/12/2020        Revision History                     Routing  History                Note Details  Jan Fireman, MD File Time 01/12/2020  3:09 PM  Author Type Physician Status Signed  Last Editor Courtney Heys, MD Service Physical Medicine and [place] # [phone] [place] Date  01/20/2020    

## 2020-01-20 NOTE — Progress Notes (Signed)
Jamse Arn, MD  Physician  Physical Medicine and Rehabilitation  PMR Pre-admission      Addendum  Date of Service:  01/20/2020 12:02 PM      Related encounter: ED to Hosp-Admission (Discharged) from 01/05/2020 in [place]       Show:Clear all [x] Manual[x] Template[x] Copied  Added by: [x] Cristina Gong, RN  [] Hover for details PMR Admission Coordinator Pre-Admission Assessment   Patient: Erika Stanton is an 64 y.o., female MRN: [id number] DOB: Apr 10, 1956 Height: 5\' 6"  (167.6 cm) Weight: 89.5 kg                                                                                                                                                  Insurance Information HMO:     PPO: yes     PCP:      IPA:      80/20:      OTHER:  PRIMARY: [place] [employer] [place]      Policy#: [id number]      Subscriber: pt CM Name: Marcie Bal      Phone#: [phone]     Fax#: [phone] Pre-Cert#: [id number] approved until 9/21 when updates are due      Employer:  Benefits:  Phone #: [phone]     Name: 9/7 Eff. Date: 05/15/2019     Deduct: $1250      Out of Pocket Max: $4890      Life Max: none  CIR: $300 co pay per admit then covers 80%      SNF: 80% 100 days  Outpatient: $26 to $52 per visit     Co-Pay: visits per medical neccesity Home Health: 80%      Co-Pay: visits per medical neccesity DME: 80%     Co-Pay: 205 Providers: IN NETWORK  SECONDARY: NONE      Policy#:       Phone#:    Development worker, community:       Phone#:    The Engineer, [employer]" for patients in Inpatient Rehabilitation Facilities with attached "Privacy Act [place] Records" was provided and verbally reviewed with: N/A   Emergency Contact Information Contact Information     Name Relation Home Work Mobile    Adama, Ivins Spouse     [phone]       Current Medical History  Patient Admitting Diagnosis: SAH   History of Present Illness:  64 year old female in  relatively good health who was admitted on 01/05/20 with onset of right headache followed by left sided weakness. 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 Dr. Kathyrn Sheriff consulted and patient underwent stent supported embolization of R-ICA aneurysm on 08/25. Post op  with lethargy and maintained on Nimotop and Keppra. EEG done showed evidence of epileptogenicity arising from right temporal region as well as severe diffuse encephalopathy but no seizures. On ASA/Plavix due to stent   She developed obtundation and follow up MRI brain done revealing development of acute cortical and subcortical infarct affecting left anterior frontal and left parietal cortex as well as punctate microemboli in right frontal and parietal regions. Slight increase in vasogenic edema noted without hydrocephalus. LT-EEG showed left cortical dysfunction felt to be due to underlying stroke- no seizures and Keppra was d/c on 09/05. Nimotop added due to concerns of vasospasms and tube feeds started for nutritional support. As mentation improved, patient noted to have flaccid RUE with decrease in verbal output. She remains NPO due to signs of aspiration--MBS pending, has cognitive deficits with disorientation, unintelligible speech and bouts of lethargy.      Complete NIHSS TOTAL: 21 Glasgow Coma Scale Score: 9   Past Medical History  History reviewed. No pertinent past medical history.   Family History  family history is not on file.   Prior Rehab/Hospitalizations:  Has the patient had prior rehab or hospitalizations prior to admission? Yes   Has the patient had major surgery during 100 days prior to admission? Yes   Current Medications    Current Facility-Administered Medications:  .  acetaminophen (TYLENOL) tablet 650 mg, 650 mg, Oral, Q4H PRN, 650 mg at 01/19/20  1015 **OR** acetaminophen (TYLENOL) 160 MG/5ML solution 650 mg, 650 mg, Per Tube, Q4H PRN, 650 mg at 01/20/20 0838 **OR** acetaminophen (TYLENOL) suppository 650 mg, 650 mg, Rectal, Q4H PRN, Dawley, Troy C, DO .  aspirin tablet 325 mg, 325 mg, Per Tube, Daily, Dawley, Troy C, DO, 325 mg at 01/20/20 1660 .  bethanechol (URECHOLINE) tablet 10 mg, 10 mg, Per Tube, TID, Dawley, Troy C, DO, 10 mg at 01/20/20 0839 .  cefTRIAXone (ROCEPHIN) 2 g in sodium chloride 0.9 % 100 mL IVPB, 2 g, Intravenous, Q24H, Chand, Sudham, MD, Last Rate: 200 mL/hr at 01/19/20 1305, 2 g at 01/19/20 1305 .  chlorhexidine (PERIDEX) 0.12 % solution 15 mL, 15 mL, Mouth Rinse, BID, Dawley, Troy C, DO, 15 mL at 01/20/20 0838 .  Chlorhexidine Gluconate Cloth 2 % PADS 6 each, 6 each, Topical, Daily, Dawley, Troy C, DO, 6 each at 01/19/20 1009 .  clopidogrel (PLAVIX) tablet 75 mg, 75 mg, Per Tube, Daily, Dawley, Troy C, DO, 75 mg at 01/20/20 6301 .  feeding supplement (JEVITY 1.2 CAL) liquid 1,000 mL, 1,000 mL, Per Tube, Continuous, Dawley, Troy C, DO, Last Rate: 60 mL/hr at 01/18/20 1015, 1,000 mL at 01/18/20 1015 .  feeding supplement (PROSource TF) liquid 45 mL, 45 mL, Per Tube, TID, Dawley, Troy C, DO, 45 mL at 01/20/20 0838 .  heparin injection 5,000 Units, 5,000 Units, Subcutaneous, Q8H, Dawley, Troy C, DO, 5,000 Units at 01/20/20 0529 .  insulin aspart (novoLOG) injection 0-15 Units, 0-15 Units, Subcutaneous, Q4H, Dawley, Troy C, DO, 2 Units at 01/20/20 0841 .  MEDLINE mouth rinse, 15 mL, Mouth Rinse, q12n4p, Dawley, Troy C, DO, 15 mL at 01/19/20 1258 .  niMODipine (NYMALIZE) 6 MG/ML oral solution 60 mg, 60 mg, Per Tube, Q4H, Dawley, Troy C, DO, 60 mg at 01/20/20 0839 .  pantoprazole sodium (PROTONIX) 40 mg/20 mL oral suspension 40 mg, 40 mg, Per Tube, QHS, Dawley, Troy C, DO, 40 mg at 01/19/20 2200 .  senna-docusate (Senokot-S) tablet 1 tablet, 1 tablet, Per Tube, BID, Dawley, Troy C, DO,  1 tablet at 01/19/20 1009   Patients  Current Diet:     Diet Order     None         Precautions / Restrictions Precautions Precautions: Fall Precaution Comments: R UE flaccid Restrictions Weight Bearing Restrictions: No    Has the patient had 2 or more falls or a fall with injury in the past year?No   Prior Activity Level Community (5-7x/wk): Independent, working and driving   Prior Functional Level Prior Function Level of Independence: Independent Comments: per chart review   Self Care: Did the patient need help bathing, dressing, using the toilet or eating?  Independent   Indoor Mobility: Did the patient need assistance with walking from room to room (with or without device)? Independent   Stairs: Did the patient need assistance with internal or external stairs (with or without device)? Independent   Functional Cognition: Did the patient need help planning regular tasks such as shopping or remembering to take medications? Independent   Home Assistive Devices / Equipment Home Assistive Devices/Equipment: Other (Comment) (handle in the shower)   Prior Device Use: Indicate devices/aids used by the patient prior to current illness, exacerbation or injury? None of the above   Current Functional Level Cognition   Arousal/Alertness: Lethargic Overall Cognitive Status: Impaired/Different from baseline Difficult to assess due to: Level of arousal Current Attention Level: Focused Orientation Level: Oriented to person, Oriented to place, Disoriented to time, Disoriented to situation Following Commands: Follows one step commands with increased time Safety/Judgement: Decreased awareness of safety, Decreased awareness of deficits General Comments: Pt more alert, anwering and mermering with questions (untelligible) Attention: Focused Focused Attention: Impaired Focused Attention Impairment: Verbal basic Problem Solving: Impaired Problem Solving Impairment: Functional basic Safety/Judgment: Impaired    Extremity  Assessment (includes Sensation/Coordination)   Upper Extremity Assessment: LUE deficits/detail, RUE deficits/detail RUE Deficits / Details: mild spasticity noted.  No definite movement noted  RUE Coordination: decreased fine motor, decreased gross motor LUE Deficits / Details: Pt attempting to move Lt UE, mostly at elbow.  She will attempt to touch mouth on command and can perform ~30% of movement with significant effort  LUE Coordination: decreased fine motor, decreased gross motor  Lower Extremity Assessment: Defer to PT evaluation     ADLs   Overall ADL's : Needs assistance/impaired Eating/Feeding: NPO General ADL Comments: Pt requires total A for all aspects.  unable to assist at this time      Mobility   Overal bed mobility: Needs Assistance Bed Mobility: Supine to Sit Rolling: +2 for physical assistance, Max assist Sidelying to sit: +2 for physical assistance, Max assist Supine to sit: Max assist, +2 for physical assistance, +2 for safety/equipment, HOB elevated Sit to supine: Max assist General bed mobility comments: max directional cues, Pt able to move legs to center of bed, but req uired max A +2 for all other aspects     Transfers   Overall transfer level: Needs assistance Equipment used: 2 person hand held assist (lift with bed pad) Transfers: Sit to/from Stand, Stand Pivot Transfers Sit to Stand: Max assist, +2 safety/equipment, +2 physical assistance Stand pivot transfers: Max assist, +2 physical assistance General transfer comment: pt powered up well with pad assist, required assist to advance LLE for pivot, noted less left lateral lean in standing     Ambulation / Gait / Stairs / Wheelchair Mobility   Ambulation/Gait General Gait Details: unable at this time     Posture / Balance Dynamic Sitting Balance Sitting balance -  Comments: mod A at edge of bed with BUE support Balance Overall balance assessment: Needs assistance Sitting-balance support: Feet supported,  Bilateral upper extremity supported Sitting balance-Leahy Scale: Poor Sitting balance - Comments: mod A at edge of bed with BUE support Postural control: Left lateral lean Standing balance support: Bilateral upper extremity supported Standing balance-Leahy Scale: Zero Standing balance comment: mod-maxA with BUE support, pt able to maintain stand for 30 second intervals with therapist support     Special needs/care consideration 8/30 Cortrak placed 43 inches right nare and bridled      Previous Home Environment  Living Arrangements: Spouse/significant other  Lives With: Spouse Available Help at Discharge: Family, Available 24 hours/day Type of Home: House Home Layout: One level Home Access: Stairs to enter Entrance Stairs-Rails: Right, Left, Can reach both Entrance Stairs-Number of Steps: 4 steps front and side entry Bathroom Shower/Tub: Tub/shower unit, Architectural technologist: Standard Bathroom Accessibility: Yes How Accessible: Accessible via walker Home Care Services: No Additional Comments: patietn has used outpatient therapy in recent months   Discharge Living Setting Plans for Discharge Living Setting: Patient's home, Lives with (comment) (spouse) Type of Home at Discharge: House Discharge Home Layout: One level Discharge Home Access: Stairs to enter Entrance Stairs-Rails: Right, Left, Can reach both Entrance Stairs-Number of Steps: 4 Discharge Bathroom Shower/Tub: Tub/shower unit, Curtain Discharge Bathroom Toilet: Standard Discharge Bathroom Accessibility: Yes How Accessible: Accessible via walker Does the patient have any problems obtaining your medications?: No   Social/Family/Support Systems Patient Roles: Spouse Contact Information: spouse, Ronalee Belts Anticipated Caregiver: spouse Anticipated Caregiver's Contact Information: [phone] Ability/Limitations of Caregiver: no limitations Caregiver Availability: 24/7 Discharge Plan Discussed with Primary Caregiver:  Yes Is Caregiver In Agreement with Plan?: Yes Does Caregiver/Family have Issues with Lodging/Transportation while Pt is in Rehab?: No   Goals Patient/Family Goal for Rehab: min assist with PT, OT, and SLP Expected length of stay: ELOS 3 weeks Pt/Family Agrees to Admission and willing to participate: Yes Program Orientation Provided & Reviewed with Pt/Caregiver Including Roles  & Responsibilities: Yes   Decrease burden of Care through IP rehab admission: n/a   Possible need for SNF placement upon discharge:not anticipated     Patient Condition: This patient's medical and functional status has changed since the consult dated 01/12/2020 in which the Rehabilitation Physician determined and documented that the patient was potentially appropriate for intensive rehabilitative care in an inpatient rehabilitation facility. Issues have been addressed and update has been discussed with Dr. Posey Pronto and patient now appropriate for inpatient rehabilitation. Will admit to inpatient rehab today.    Preadmission Screen Completed By:  Cleatrice Burke, RN, 01/20/2020 12:02 PM ______________________________________________________________________   Discussed status with Dr. Posey Pronto on 01/20/2020 at  1212 and received approval for admission today.   Admission Coordinator:  Cleatrice Burke, time 4709 Date 01/20/2020         Revision History                          Note Details  Author Jamse Arn, MD File Time 01/20/2020 12:14 PM  Author Type Physician Status Addendum  Last Editor Jamse Arn, MD Service Physical Medicine and [place] # [id number] Admit Date 01/20/2020

## 2020-01-20 NOTE — Progress Notes (Signed)
Physical Therapy Treatment Patient Details Name: Erika Stanton MRN: [id number] DOB: March 01, 1956 Today's Date: 01/20/2020    History of Present Illness 64 y.o. female with no past medical history.  Per husband patient had walked outside then came in to the house complaining of a headache at 9 10 in the morning.  She then had gone into the shower and called for husband due to left arm and left leg weakness. 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 Pt underwent coil embolization of RICA aneurysm on 8/25 with additional finding of LICA aneurysm.    PT Comments    Pt tolerates treatment well, continues to demonstrates slowed processing and requires initiation of most motor tasks. Pt remains flaccid in LUE and continues to require significant physical assistance for all functional mobility tasks. Pt with left lateral lean during sitting, greatly increasing her falls risk. Pt will continue to benefit from PT POC to improve mobility quality and reduce falls risk during transfers. PT continues to recommend CIR placement at this time.   Follow Up Recommendations  CIR     Equipment Recommendations  Wheelchair (measurements PT);Wheelchair cushion (measurements PT);Hospital bed (mechanical lift, all if home today)    Recommendations for Other Services       Precautions / Restrictions Precautions Precautions: Fall Precaution Comments: R UE flaccid Restrictions Weight Bearing Restrictions: No    Mobility  Bed Mobility Overal bed mobility: Needs Assistance Bed Mobility: Supine to Sit     Supine to sit: Max assist;+2 for physical assistance;+2 for safety/equipment;HOB elevated     General bed mobility comments: max directional cues, Pt able to move legs to center of bed, but req uired max A +2 for all other aspects  Transfers Overall transfer level: Needs assistance Equipment used: 2 person hand held assist (lift with bed pad) Transfers: Sit to/from [employer]  Transfers Sit to Stand: Max assist;+2 safety/equipment;+2 physical assistance Stand pivot transfers: Max assist;+2 physical assistance       General transfer comment: pt powered up well with pad assist, required assist to advance LLE for pivot, noted less left lateral lean in standing  Ambulation/Gait                 Stairs             Wheelchair Mobility    Modified Rankin (Stroke Patients Only) Modified Rankin (Stroke Patients Only) Pre-Morbid Rankin Score: No significant disability Modified Rankin: Severe disability     Balance Overall balance assessment: Needs assistance Sitting-balance support: Feet supported;Bilateral upper extremity supported Sitting balance-Leahy Scale: Poor Sitting balance - Comments: mod A at edge of bed with BUE support Postural control: Left lateral lean Standing balance support: Bilateral upper extremity supported Standing balance-Leahy Scale: Zero Standing balance comment: mod-maxA with BUE support, pt able to maintain stand for 30 second intervals with therapist support                            Cognition Arousal/Alertness: Awake/alert Behavior During Therapy: Flat affect Overall Cognitive Status: Impaired/Different from baseline Area of Impairment: Attention;Memory;Following commands;Safety/judgement;Problem solving;Awareness                 Orientation Level: Disoriented to;Place;Time;Situation (pt responded to being called Erika Stanton) Current Attention Level: Focused Memory: Decreased recall of precautions;Decreased short-term memory Following Commands: Follows one step commands with increased time Safety/Judgement: Decreased awareness of safety;Decreased awareness of deficits Awareness: Intellectual Problem Solving: Slow processing;Requires verbal cues;Requires  tactile cues;Difficulty sequencing;Decreased initiation General Comments: Pt more alert, anwering and mermering with questions (untelligible)       Exercises Other Exercises Other Exercises: PROM for head and neck stretching    General Comments General comments (skin integrity, edema, etc.): VSS on RA      Pertinent Vitals/Pain Pain Assessment: Faces Faces Pain Scale: Hurts a little bit Pain Location: moaning with L UE movement, stretching/ROM of head and neck Pain Descriptors / Indicators: Moaning;Grimacing Pain Intervention(s): Monitored during session;Repositioned    Home Living                      Prior Function            PT Goals (current goals can now be found in the care plan section) Acute Rehab PT Goals Patient Stated Goal: to get stronger Progress towards PT goals: Progressing toward goals (very slowly)    Frequency    Min 4X/week      PT Plan Current plan remains appropriate    Co-evaluation PT/OT/SLP Co-Evaluation/Treatment: Yes Reason for Co-Treatment: Complexity of the patient's impairments (multi-system involvement);For patient/therapist safety;To address functional/ADL transfers PT goals addressed during session: Mobility/safety with mobility;Balance;Strengthening/ROM OT goals addressed during session: ADL's and self-care;Strengthening/ROM      AM-PAC PT "6 Clicks" Mobility   Outcome Measure  Help needed turning from your back to your side while in a flat bed without using bedrails?: Total Help needed moving from lying on your back to sitting on the side of a flat bed without using bedrails?: Total Help needed moving to and from a bed to a chair (including a wheelchair)?: Total Help needed standing up from a chair using your arms (e.g., wheelchair or bedside chair)?: Total Help needed to walk in hospital room?: Total Help needed climbing 3-5 steps with a railing? : Total 6 Click Score: 6    End of Session Equipment Utilized During Treatment: Gait belt Activity Tolerance: Patient tolerated treatment well Patient left: in chair;with call bell/phone within reach;with chair alarm  set Nurse Communication: Mobility status PT Visit Diagnosis: Other abnormalities of gait and mobility (R26.89);Muscle weakness (generalized) (M62.81);Other symptoms and signs involving the nervous system (R29.898);Pain     Time: 4709-2957 PT Time Calculation (min) (ACUTE ONLY): 35 min  Charges:  $Therapeutic Activity: 8-22 mins                     Zenaida Niece, PT, DPT Acute Rehabilitation Pager: [phone]    Zenaida Niece 01/20/2020, 10:29 AM

## 2020-01-20 NOTE — PMR Pre-admission (Addendum)
PMR Admission Coordinator Pre-Admission Assessment  Patient: Erika Stanton is an 64 y.o., female MRN: [id number] DOB: Aug 15, 1955 Height: 5\' 6"  (167.6 cm) Weight: 89.5 kg              Insurance Information HMO:     PPO: yes     PCP:      IPA:      80/20:      OTHER:  PRIMARY: [place] [employer] [place]      Policy#: [id number]      Subscriber: pt CM Name: Marcie Bal      Phone#: [phone]     Fax#: [phone] Pre-Cert#: [id number] approved until 9/21 when updates are due      Employer:  Benefits:  Phone #: [phone]     Name: 9/7 Eff. Date: 05/15/2019     Deduct: $1250      Out of Pocket Max: $4890      Life Max: none  CIR: $300 co pay per admit then covers 80%      SNF: 80% 100 days  Outpatient: $26 to $52 per visit     Co-Pay: visits per medical neccesity Home Health: 80%      Co-Pay: visits per medical neccesity DME: 80%     Co-Pay: 205 Providers: IN NETWORK  SECONDARY: NONE      Policy#:       Phone#:   Development worker, community:       Phone#:   The Engineer, [employer]" for patients in Inpatient Rehabilitation Facilities with attached "Privacy Act [place] Records" was provided and verbally reviewed with: N/A  Emergency Contact Information Contact Information    Name Relation Home Work Mobile   Maricia, Scotti Spouse   [phone]     Current Medical History  Patient Admitting Diagnosis: SAH  History of Present Illness:  64 year old female in relatively good health who was admitted on 01/05/20 with onset of right headache followed by left sided weakness. 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 Dr. Kathyrn Sheriff consulted and patient underwent stent supported embolization of R-ICA aneurysm on 08/25. Post op with lethargy and maintained on Nimotop and Keppra. EEG done showed evidence of epileptogenicity  arising from right temporal region as well as severe diffuse encephalopathy but no seizures. On ASA/Plavix due to stent   She developed obtundation and follow up MRI brain done revealing development of acute cortical and subcortical infarct affecting left anterior frontal and left parietal cortex as well as punctate microemboli in right frontal and parietal regions. Slight increase in vasogenic edema noted without hydrocephalus. LT-EEG showed left cortical dysfunction felt to be due to underlying stroke- no seizures and Keppra was d/c on 09/05. Nimotop added due to concerns of vasospasms and tube feeds started for nutritional support. As mentation improved, patient noted to have flaccid RUE with decrease in verbal output. She remains NPO due to signs of aspiration--MBS pending, has cognitive deficits with disorientation, unintelligible speech and bouts of lethargy.    Complete NIHSS TOTAL: 21 Glasgow Coma Scale Score: 9  Past Medical History  History reviewed. No pertinent past medical history.  Family History  family history is not on file.  Prior Rehab/Hospitalizations:  Has the patient had prior rehab or hospitalizations prior to admission? Yes  Has the patient had major surgery during 100 days prior to admission? Yes  Current Medications   Current Facility-Administered Medications:  .  acetaminophen (TYLENOL) tablet 650 mg, 650 mg, Oral, Q4H PRN, 650 mg at 01/19/20 1015 **OR** acetaminophen (TYLENOL) 160 MG/5ML solution 650 mg, 650 mg, Per Tube, Q4H PRN, 650 mg at 01/20/20 0838 **OR** acetaminophen (TYLENOL) suppository 650 mg, 650 mg, Rectal, Q4H PRN, Dawley, Troy C, DO .  aspirin tablet 325 mg, 325 mg, Per Tube, Daily, Dawley, Troy C, DO, 325 mg at 01/20/20 2706 .  bethanechol (URECHOLINE) tablet 10 mg, 10 mg, Per Tube, TID, Dawley, Troy C, DO, 10 mg at 01/20/20 0839 .  cefTRIAXone (ROCEPHIN) 2 g in sodium chloride 0.9 % 100 mL IVPB, 2 g, Intravenous, Q24H, Chand, Sudham, MD, Last  Rate: 200 mL/hr at 01/19/20 1305, 2 g at 01/19/20 1305 .  chlorhexidine (PERIDEX) 0.12 % solution 15 mL, 15 mL, Mouth Rinse, BID, Dawley, Troy C, DO, 15 mL at 01/20/20 0838 .  Chlorhexidine Gluconate Cloth 2 % PADS 6 each, 6 each, Topical, Daily, Dawley, Troy C, DO, 6 each at 01/19/20 1009 .  clopidogrel (PLAVIX) tablet 75 mg, 75 mg, Per Tube, Daily, Dawley, Troy C, DO, 75 mg at 01/20/20 2376 .  feeding supplement (JEVITY 1.2 CAL) liquid 1,000 mL, 1,000 mL, Per Tube, Continuous, Dawley, Troy C, DO, Last Rate: 60 mL/hr at 01/18/20 1015, 1,000 mL at 01/18/20 1015 .  feeding supplement (PROSource TF) liquid 45 mL, 45 mL, Per Tube, TID, Dawley, Troy C, DO, 45 mL at 01/20/20 0838 .  heparin injection 5,000 Units, 5,000 Units, Subcutaneous, Q8H, Dawley, Troy C, DO, 5,000 Units at 01/20/20 0529 .  insulin aspart (novoLOG) injection 0-15 Units, 0-15 Units, Subcutaneous, Q4H, Dawley, Troy C, DO, 2 Units at 01/20/20 0841 .  MEDLINE mouth rinse, 15 mL, Mouth Rinse, q12n4p, Dawley, Troy C, DO, 15 mL at 01/19/20 1258 .  niMODipine (NYMALIZE) 6 MG/ML oral solution 60 mg, 60 mg, Per Tube, Q4H, Dawley, Troy C, DO, 60 mg at 01/20/20 0839 .  pantoprazole sodium (PROTONIX) 40 mg/20 mL oral suspension 40 mg, 40 mg, Per Tube, QHS, Dawley, Troy C, DO, 40 mg at 01/19/20 2200 .  senna-docusate (Senokot-S) tablet 1 tablet, 1 tablet, Per Tube, BID, Dawley, Troy C, DO, 1 tablet at 01/19/20 1009  Patients Current Diet:  Diet Order    None      Precautions / Restrictions Precautions Precautions: Fall Precaution Comments: R UE flaccid Restrictions Weight Bearing Restrictions: No   Has the patient had 2 or more falls or a fall with injury in the past year?No  Prior Activity Level Community (5-7x/wk): Independent, working and driving  Prior Functional Level Prior Function Level of Independence: Independent Comments: per chart review  Self Care: Did the patient need help bathing, dressing, using the toilet or  eating?  Independent  Indoor Mobility: Did the patient need assistance with walking from room to room (with or without device)? Independent  Stairs: Did the patient need assistance with internal or external stairs (with or without device)? Independent  Functional Cognition: Did the patient need help planning regular tasks such as shopping or remembering to take medications? Independent  Home Assistive Devices / Equipment Home Assistive Devices/Equipment: Other (Comment) (handle in the shower)  Prior Device Use: Indicate devices/aids used by the patient prior to current illness, exacerbation or injury? None of the above  Current Functional Level Cognition  Arousal/Alertness: Lethargic Overall Cognitive Status: Impaired/Different from baseline Difficult to assess due to: Level of arousal Current Attention Level: Focused Orientation Level: Oriented to  person, Oriented to place, Disoriented to time, Disoriented to situation Following Commands: Follows one step commands with increased time Safety/Judgement: Decreased awareness of safety, Decreased awareness of deficits General Comments: Pt more alert, anwering and mermering with questions (untelligible) Attention: Focused Focused Attention: Impaired Focused Attention Impairment: Verbal basic Problem Solving: Impaired Problem Solving Impairment: Functional basic Safety/Judgment: Impaired    Extremity Assessment (includes Sensation/Coordination)  Upper Extremity Assessment: LUE deficits/detail, RUE deficits/detail RUE Deficits / Details: mild spasticity noted.  No definite movement noted  RUE Coordination: decreased fine motor, decreased gross motor LUE Deficits / Details: Pt attempting to move Lt UE, mostly at elbow.  She will attempt to touch mouth on command and can perform ~30% of movement with significant effort  LUE Coordination: decreased fine motor, decreased gross motor  Lower Extremity Assessment: Defer to PT evaluation     ADLs  Overall ADL's : Needs assistance/impaired Eating/Feeding: NPO General ADL Comments: Pt requires total A for all aspects.  unable to assist at this time     Mobility  Overal bed mobility: Needs Assistance Bed Mobility: Supine to Sit Rolling: +2 for physical assistance, Max assist Sidelying to sit: +2 for physical assistance, Max assist Supine to sit: Max assist, +2 for physical assistance, +2 for safety/equipment, HOB elevated Sit to supine: Max assist General bed mobility comments: max directional cues, Pt able to move legs to center of bed, but req uired max A +2 for all other aspects    Transfers  Overall transfer level: Needs assistance Equipment used: 2 person hand held assist (lift with bed pad) Transfers: Sit to/from Stand, Stand Pivot Transfers Sit to Stand: Max assist, +2 safety/equipment, +2 physical assistance Stand pivot transfers: Max assist, +2 physical assistance General transfer comment: pt powered up well with pad assist, required assist to advance LLE for pivot, noted less left lateral lean in standing    Ambulation / Gait / Stairs / Wheelchair Mobility  Ambulation/Gait General Gait Details: unable at this time    Posture / Balance Dynamic Sitting Balance Sitting balance - Comments: mod A at edge of bed with BUE support Balance Overall balance assessment: Needs assistance Sitting-balance support: Feet supported, Bilateral upper extremity supported Sitting balance-Leahy Scale: Poor Sitting balance - Comments: mod A at edge of bed with BUE support Postural control: Left lateral lean Standing balance support: Bilateral upper extremity supported Standing balance-Leahy Scale: Zero Standing balance comment: mod-maxA with BUE support, pt able to maintain stand for 30 second intervals with therapist support    Special needs/care consideration 8/30 Cortrak placed 43 inches right nare and bridled    Previous Home Environment  Living Arrangements:  Spouse/significant other  Lives With: Spouse Available Help at Discharge: Family, Available 24 hours/day Type of Home: House Home Layout: One level Home Access: Stairs to enter Entrance Stairs-Rails: Right, Left, Can reach both Entrance Stairs-Number of Steps: 4 steps front and side entry Bathroom Shower/Tub: Tub/shower unit, Architectural technologist: Standard Bathroom Accessibility: Yes How Accessible: Accessible via walker Home Care Services: No Additional Comments: patietn has used outpatient therapy in recent months  Discharge Living Setting Plans for Discharge Living Setting: Patient's home, Lives with (comment) (spouse) Type of Home at Discharge: House Discharge Home Layout: One level Discharge Home Access: Stairs to enter Entrance Stairs-Rails: Right, Left, Can reach both Entrance Stairs-Number of Steps: 4 Discharge Bathroom Shower/Tub: Tub/shower unit, Curtain Discharge Bathroom Toilet: Standard Discharge Bathroom Accessibility: Yes How Accessible: Accessible via walker Does the patient have any problems obtaining your medications?: No  Social/Family/Support Systems Patient Roles: Spouse Contact Information: spouse, Ronalee Belts Anticipated Caregiver: spouse Anticipated Caregiver's Contact Information: [phone] Ability/Limitations of Caregiver: no limitations Caregiver Availability: 24/7 Discharge Plan Discussed with Primary Caregiver: Yes Is Caregiver In Agreement with Plan?: Yes Does Caregiver/Family have Issues with Lodging/Transportation while Pt is in Rehab?: No  Goals Patient/Family Goal for Rehab: min assist with PT, OT, and SLP Expected length of stay: ELOS 3 weeks Pt/Family Agrees to Admission and willing to participate: Yes Program Orientation Provided & Reviewed with Pt/Caregiver Including Roles  & Responsibilities: Yes  Decrease burden of Care through IP rehab admission: n/a  Possible need for SNF placement upon discharge:not anticipated   Patient  Condition: This patient's medical and functional status has changed since the consult dated 01/12/2020 in which the Rehabilitation Physician determined and documented that the patient was potentially appropriate for intensive rehabilitative care in an inpatient rehabilitation facility. Issues have been addressed and update has been discussed with Dr. Posey Pronto and patient now appropriate for inpatient rehabilitation. Will admit to inpatient rehab today.   Preadmission Screen Completed By:  Cleatrice Burke, RN, 01/20/2020 12:02 PM ______________________________________________________________________   Discussed status with Dr. Posey Pronto on 01/20/2020 at  1212 and received approval for admission today.  Admission Coordinator:  Cleatrice Burke, time [id number] Date 01/20/2020

## 2020-01-20 NOTE — Progress Notes (Signed)
Patient MEWS score 2 at 1957 d/t previous LOC at 1700 reported as responds to voice and RR 22. Upon assessment patient is a/o x3 with slurred speech, alert and arousable. Pt able to state she is in the hospital d/t an aneurysm. Only disoriented to year. VS WNL. Yellow MEWS score reported to [facility]. Q2 VS obtained. After second set of VS MEWS score now green. Will continue to monitor.

## 2020-01-20 NOTE — H&P (Signed)
Physical Medicine and Rehabilitation Admission H&P    Chief Complaint  Patient presents with  . Functional deficits due to aneurysm rupture    HPI:  Erika Stanton is a 64 year old female in relatively good health who was admitted on 01/05/2020 with right-sided headache and left hemiparesis.  History taken from chart review, nursing, and sister due to mentation.  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 Dr. Kathyrn Sheriff consulted and patient underwent stent supported embolization of R-ICA aneurysm on 01/06/2020.  Post op with lethargy and maintained on nimotop and keppra. EEG done showed evidence of epileptogenicity arising from right temporal region as well as severe diffuse encephalopathy but no seizures. On ASA/Plavix due to stent.   She developed obtundation and follow up MRI brain done revealing development of acute cortical and subcortical infarct affecting left anterior frontal and left parietal cortex as well as punctate microemboli in right frontal and parietal regions. Slight increase in vasogenic edema noted without hydrocephalus. LT-EEG showed left cortical dysfunction felt to be due to underlying stroke- no seizures and Keppra was dced on 01/17/2020.  Nimotop added due to concerns of vasospasms and tube feeds started for nutritional support. As mentation improved, patient noted to have flaccid RUE with decrease in verbal output. She remains NPO due to signs of aspiration--MBS pending, has cognitive deficits with disorientation, unintelligible speech and bouts of lethargy. Therapy ongoing and patient required assistance to advance LLE for pregait activity. CIR recommended due to functional decline.  Please see preadmission assessment from earlier today as well.  Review of Systems  Unable to perform ROS: Mental acuity    History  reviewed. No pertinent past medical history.  Unable to obtain from patient.  Past Surgical History:  Procedure Laterality Date  . CHOLECYSTECTOMY    . IR 3D INDEPENDENT WKST  01/06/2020  . IR ANGIO INTRA EXTRACRAN SEL INTERNAL CAROTID BILAT MOD SED  01/06/2020  . IR ANGIO VERTEBRAL SEL VERTEBRAL UNI L MOD SED  01/06/2020  . IR ANGIOGRAM FOLLOW UP STUDY  01/06/2020  . IR ANGIOGRAM FOLLOW UP STUDY  01/06/2020  . IR ANGIOGRAM FOLLOW UP STUDY  01/06/2020  . IR ANGIOGRAM FOLLOW UP STUDY  01/06/2020  . IR ANGIOGRAM FOLLOW UP STUDY  01/06/2020  . IR ANGIOGRAM FOLLOW UP STUDY  01/06/2020  . IR ANGIOGRAM FOLLOW UP STUDY  01/06/2020  . IR ANGIOGRAM FOLLOW UP STUDY  01/06/2020  . IR ANGIOGRAM FOLLOW UP STUDY  01/06/2020  . IR ANGIOGRAM FOLLOW UP STUDY  01/06/2020  . IR NEURO EACH ADD'L AFTER BASIC UNI RIGHT (MS)  01/06/2020  . IR TRANSCATH/EMBOLIZ  01/06/2020  . RADIOLOGY WITH ANESTHESIA N/A 01/06/2020   Procedure: IR WITH ANESTHESIA FOR ANEURYSM;  Surgeon: Consuella Lose, MD;  Location: [facility];  Service: Radiology;  Laterality: N/A;  . TUBAL LIGATION      Family History  Problem Relation Age of Onset  . Cancer Mother   . Congestive Heart Failure Mother   . Lung cancer Father   . Lung cancer Sister      Social History:  Married. Works as an Optometrist for [employer]. Independent without AD. She quit smoking 10+ years ago and uses alcohol occasionally?    Allergies  Allergen Reactions  . Sulfa Antibiotics Rash   Medications Prior to Admission  Medication Sig Dispense Refill  . [  START ON 01/21/2020] aspirin 325 MG tablet Place 1 tablet (325 mg total) into feeding tube daily. 30 tablet 3  . [START ON 01/21/2020] clopidogrel (PLAVIX) 75 MG tablet Place 1 tablet (75 mg total) into feeding tube daily. 30 tablet 2  . niMODipine (NYMALIZE) 6 MG/ML SOLN Place 10 mLs (60 mg total) into feeding tube every 4 (four) hours. 1260 mL 0    Drug Regimen Review  Drug regimen was reviewed and remains appropriate with no  significant issues identified  Home: Home Living Family/patient expects to be discharged to:: Private residence Living Arrangements: Spouse/significant other   Functional History:    Functional Status:  Mobility:          ADL:    Cognition: Cognition Orientation Level: Oriented to person     Blood pressure 138/87, pulse 97, temperature 98 F (36.7 C), resp. rate (!) 22, height 5\' 6"  (1.676 m), weight 89.5 kg, SpO2 94 %. Physical Exam Vitals and nursing note reviewed.  Constitutional:      General: She is not in acute distress.    Appearance: She is obese.     Comments: +Cortrack in place with neck flexed to the chest and head rotated to the left. Had discomfort with attempts to bring head to midline.  HENT:     Head: Normocephalic and atraumatic.     Right Ear: External ear normal.     Left Ear: External ear normal.     Nose: Nose normal.  Eyes:     General:        Right eye: No discharge.        Left eye: No discharge.     Comments: Left gaze preference  Cardiovascular:     Rate and Rhythm: Normal rate and regular rhythm.  Pulmonary:     Effort: Pulmonary effort is normal.     Breath sounds: No stridor. Rhonchi present.  Abdominal:     General: Abdomen is flat. Bowel sounds are normal. There is no distension.  Musculoskeletal:     Cervical back: Normal range of motion.     Comments: No edema or tenderness in extremities  Skin:    General: Skin is warm and dry.     Comments: Multiple ecchymotic areas on left mid shin with mild erythema at lower aspect.  Few bruises noted on RLE and around ankle.    Neurological:     Mental Status: She is alert.     Comments: Somnolent/lethargic Grunting sounds at times with soft and muttering speech, dysarthria Able to state her name as Erika Stanton, age and partially state DOB. She was able to answer some biographic questions. Opens mouth minimally Motor: Limited due to participation Right upper extremity: Minimal  movement noted Left upper extremity: >/2+/5 proximal distal Right lower extremity: Moving spontaneously Left lower extremity: Some spontaneous movement noted   Psychiatric:     Comments: Unable to assess due to mentation     Results for orders placed or performed during the hospital encounter of 01/05/20 (from the past 48 hour(s))  Glucose, capillary     Status: Abnormal   Collection Time: 01/18/20 11:39 PM  Result Value Ref Range   Glucose-Capillary 124 (H) 70 - 99 mg/dL    Comment: Glucose reference range applies only to samples taken after fasting for at least 8 hours.  Glucose, capillary     Status: Abnormal   Collection Time: 01/19/20  4:22 AM  Result Value Ref Range   Glucose-Capillary 133 (H)  70 - 99 mg/dL    Comment: Glucose reference range applies only to samples taken after fasting for at least 8 hours.  Glucose, capillary     Status: Abnormal   Collection Time: 01/19/20  7:44 AM  Result Value Ref Range   Glucose-Capillary 133 (H) 70 - 99 mg/dL    Comment: Glucose reference range applies only to samples taken after fasting for at least 8 hours.  Glucose, capillary     Status: Abnormal   Collection Time: 01/19/20 12:58 PM  Result Value Ref Range   Glucose-Capillary 141 (H) 70 - 99 mg/dL    Comment: Glucose reference range applies only to samples taken after fasting for at least 8 hours.  Glucose, capillary     Status: Abnormal   Collection Time: 01/19/20  4:37 PM  Result Value Ref Range   Glucose-Capillary 123 (H) 70 - 99 mg/dL    Comment: Glucose reference range applies only to samples taken after fasting for at least 8 hours.   Comment 1 Notify RN    Comment 2 Document in Chart   Glucose, capillary     Status: Abnormal   Collection Time: 01/19/20  7:44 PM  Result Value Ref Range   Glucose-Capillary 151 (H) 70 - 99 mg/dL    Comment: Glucose reference range applies only to samples taken after fasting for at least 8 hours.  Glucose, capillary     Status: Abnormal    Collection Time: 01/19/20 11:42 PM  Result Value Ref Range   Glucose-Capillary 125 (H) 70 - 99 mg/dL    Comment: Glucose reference range applies only to samples taken after fasting for at least 8 hours.  Basic metabolic panel     Status: Abnormal   Collection Time: 01/20/20  2:44 AM  Result Value Ref Range   Sodium 136 135 - 145 mmol/L   Potassium 3.8 3.5 - 5.1 mmol/L   Chloride 106 98 - 111 mmol/L   CO2 22 22 - 32 mmol/L   Glucose, Bld 77 70 - 99 mg/dL    Comment: Glucose reference range applies only to samples taken after fasting for at least 8 hours.   BUN 18 8 - 23 mg/dL   Creatinine, Ser 1.23 (H) 0.44 - 1.00 mg/dL   Calcium 8.0 (L) 8.9 - 10.3 mg/dL   GFR calc non Af Amer 47 (L) >60 mL/min   GFR calc Af Amer 54 (L) >60 mL/min   Anion gap 8 5 - 15    Comment: Performed at [facility] [address] [place], [place] [address]  CBC     Status: Abnormal   Collection Time: 01/20/20  2:44 AM  Result Value Ref Range   WBC 5.8 4.0 - 10.5 K/uL   RBC 2.84 (L) 3.87 - 5.11 MIL/uL   Hemoglobin 8.3 (L) 12.0 - 15.0 g/dL   HCT 25.2 (L) 36 - 46 %   MCV 88.7 80.0 - 100.0 fL   MCH 29.2 26.0 - 34.0 pg   MCHC 32.9 30.0 - 36.0 g/dL   RDW 13.3 11.5 - 15.5 %   Platelets 77 (L) 150 - 400 K/uL    Comment: REPEATED TO VERIFY PLATELET COUNT CONFIRMED BY SMEAR SPECIMEN CHECKED FOR CLOTS Immature Platelet Fraction may be clinically indicated, consider ordering this additional test [id number]    nRBC 0.0 0.0 - 0.2 %    Comment: Performed at [facility], [place] [address], [place], [place] [address]  Glucose, capillary  Status: Abnormal   Collection Time: 01/20/20  4:16 AM  Result Value Ref Range   Glucose-Capillary 142 (H) 70 - 99 mg/dL    Comment: Glucose reference range applies only to samples taken after fasting for at least 8 hours.  Glucose, capillary     Status: Abnormal   Collection Time: 01/20/20  8:08 AM  Result Value Ref Range   Glucose-Capillary 146 (H) 70 - 99 mg/dL      Comment: Glucose reference range applies only to samples taken after fasting for at least 8 hours.  Glucose, capillary     Status: Abnormal   Collection Time: 01/20/20 11:58 AM  Result Value Ref Range   Glucose-Capillary 142 (H) 70 - 99 mg/dL    Comment: Glucose reference range applies only to samples taken after fasting for at least 8 hours.  Glucose, capillary     Status: Abnormal   Collection Time: 01/20/20  4:07 PM  Result Value Ref Range   Glucose-Capillary 115 (H) 70 - 99 mg/dL    Comment: Glucose reference range applies only to samples taken after fasting for at least 8 hours.   VAS [place] TRANSCRANIAL DOPPLER  Result Date: 01/19/2020  Transcranial Doppler Indications: Subarachnoid hemorrhage. Limitations: patient positioning/ movement Comparison Study: 01/15/20 previous Performing Technologist: Abram Sander RVS  Examination Guidelines: A complete evaluation includes B-mode imaging, spectral Doppler, color Doppler, and power Doppler as needed of all accessible portions of each vessel. Bilateral testing is considered an integral part of a complete examination. Limited examinations for reoccurring indications may be performed as noted.  +----------+-------------+----------+-----------+-------+ RIGHT TCD Right VM (cm)Depth (cm)PulsatilityComment +----------+-------------+----------+-----------+-------+ MCA           37.00                 1.15            +----------+-------------+----------+-----------+-------+ ACA          -15.00                 1.03            +----------+-------------+----------+-----------+-------+ Term ICA      37.00                 1.42            +----------+-------------+----------+-----------+-------+ PCA           21.00                 1.08            +----------+-------------+----------+-----------+-------+ Opthalmic     20.00                 1.23            +----------+-------------+----------+-----------+-------+ ICA siphon    29.00                  1.40            +----------+-------------+----------+-----------+-------+ Vertebral    -23.00                 1.02            +----------+-------------+----------+-----------+-------+  +----------+------------+----------+-----------+-------+ LEFT TCD  Left VM (cm)Depth (cm)PulsatilityComment +----------+------------+----------+-----------+-------+ MCA          47.00                 0.93            +----------+------------+----------+-----------+-------+ ACA          -  38.00                0.75            +----------+------------+----------+-----------+-------+ Term ICA     37.00                 1.05            +----------+------------+----------+-----------+-------+ PCA          21.00                 1.64            +----------+------------+----------+-----------+-------+ Opthalmic    24.00                 1.12            +----------+------------+----------+-----------+-------+ ICA siphon   54.00                 1.10            +----------+------------+----------+-----------+-------+ Vertebral    -24.00                1.27            +----------+------------+----------+-----------+-------+  +------------+-------+-------+             VM cm/sComment +------------+-------+-------+ Dist Basilar-29.00         +------------+-------+-------+ Summary: This was a normal transcranial Doppler study, with normal flow direction and velocity of all identified vessels of the anterior and posterior circulations, with no evidence of stenosis, vasospasm or occlusion. There was no evidence of intracranial disease. *See table(s) above for TCD measurements and observations.  Diagnosing physician: Antony Contras MD Electronically signed by Antony Contras MD on 01/19/2020 at 4:42:25 PM.    Final        Medical Problem List and Plan: 1.  Right hemiparesis, dysphagia, cognitive deficits with disorientation, unintelligible speech lethargy secondary to bilateral  intracranial hemorrhages with subsequent infarcts.  -patient may not shower  -ELOS/Goals: 24-28 days/Min A/supervision.  Admit to CIR 2.  Antithrombotics: -DVT/anticoagulation:  Pharmaceutical: Heparin--hold    Dopplers ordered to rule out DVT.   -antiplatelet therapy: DAPT 3. Pain Management: Tylenol prn.  4. Mood: LCSW to follow for evaluation and support when appropriate.   -antipsychotic agents: N/A 5. Neuropsych: This patient is not capable of making decisions on her own behalf. 6. Skin/Wound Care: routine pressure relief measures.  7. Fluids/Electrolytes/Nutrition: NPO with Jevity TF- increased to 70 cc/hr so can be held 4 hours for therapy. Added water flushes.  8. ABLA: Monitor H/H. Hgb trending down 11.1-->8.3.  CBC ordered tommorrow 9. Thrombocytopenia: Platelets have dropped from 274-->77 today. Monitor for signs of bleeding.  CBC ordered for tomorrow.  Will consider HIT panel. 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 Oral care qid. 11. Resting tachycardia: HR ranging at 100-110s likely due to debility/medical issues. Monitor for now.   Monitor with increased exertion 12. Vasospasm prevention: To continue Nimotop for 7 additional days.   Bary Leriche, PA-C 01/20/2020  I have personally performed a face to face diagnostic evaluation, including, but not limited to relevant history and physical exam findings, of this patient and developed relevant assessment and plan.  Additionally, I have reviewed and concur with the physician assistant's documentation above.  Delice Lesch, MD, ABPMR  The patient's status has not changed. The original post admission physician evaluation remains appropriate, and any changes from the pre-admission screening or documentation from the acute chart are noted above.  Delice Lesch, MD, ABPMR

## 2020-01-20 NOTE — Progress Notes (Signed)
Report given to RN on 4W. Patient bathed and bed changed. Moving all belongings and patient to [phone].

## 2020-01-21 ENCOUNTER — Inpatient Hospital Stay (HOSPITAL_COMMUNITY): Payer: BC Managed Care – PPO | Admitting: Speech Pathology

## 2020-01-21 ENCOUNTER — Inpatient Hospital Stay (HOSPITAL_COMMUNITY): Payer: BC Managed Care – PPO | Admitting: Occupational Therapy

## 2020-01-21 ENCOUNTER — Inpatient Hospital Stay (HOSPITAL_COMMUNITY): Payer: BC Managed Care – PPO

## 2020-01-21 DIAGNOSIS — D696 Thrombocytopenia, unspecified: Secondary | ICD-10-CM

## 2020-01-21 DIAGNOSIS — I69391 Dysphagia following cerebral infarction: Secondary | ICD-10-CM

## 2020-01-21 DIAGNOSIS — I607 Nontraumatic subarachnoid hemorrhage from unspecified intracranial artery: Secondary | ICD-10-CM

## 2020-01-21 DIAGNOSIS — R195 Other fecal abnormalities: Secondary | ICD-10-CM

## 2020-01-21 LAB — CBC WITH DIFFERENTIAL/PLATELET
Abs Immature Granulocytes: 0.1 10*3/uL — ABNORMAL HIGH (ref 0.00–0.07)
Basophils Absolute: 0 10*3/uL (ref 0.0–0.1)
Basophils Relative: 0 %
Eosinophils Absolute: 0.1 10*3/uL (ref 0.0–0.5)
Eosinophils Relative: 1 %
HCT: 39.8 % (ref 36.0–46.0)
Hemoglobin: 12.5 g/dL (ref 12.0–15.0)
Immature Granulocytes: 1 %
Lymphocytes Relative: 19 %
Lymphs Abs: 1.9 10*3/uL (ref 0.7–4.0)
MCH: 29.9 pg (ref 26.0–34.0)
MCHC: 31.4 g/dL (ref 30.0–36.0)
MCV: 95.2 fL (ref 80.0–100.0)
Monocytes Absolute: 0.5 10*3/uL (ref 0.1–1.0)
Monocytes Relative: 5 %
Neutro Abs: 7.1 10*3/uL (ref 1.7–7.7)
Neutrophils Relative %: 74 %
Platelets: 476 10*3/uL — ABNORMAL HIGH (ref 150–400)
RBC: 4.18 MIL/uL (ref 3.87–5.11)
RDW: 12.5 % (ref 11.5–15.5)
WBC: 9.7 10*3/uL (ref 4.0–10.5)
nRBC: 0 % (ref 0.0–0.2)

## 2020-01-21 LAB — COMPREHENSIVE METABOLIC PANEL
ALT: 56 U/L — ABNORMAL HIGH (ref 0–44)
AST: 32 U/L (ref 15–41)
Albumin: 3.2 g/dL — ABNORMAL LOW (ref 3.5–5.0)
Alkaline Phosphatase: 95 U/L (ref 38–126)
Anion gap: 11 (ref 5–15)
BUN: 33 mg/dL — ABNORMAL HIGH (ref 8–23)
CO2: 23 mmol/L (ref 22–32)
Calcium: 9.6 mg/dL (ref 8.9–10.3)
Chloride: 108 mmol/L (ref 98–111)
Creatinine, Ser: 0.51 mg/dL (ref 0.44–1.00)
GFR calc Af Amer: >60 mL/min (ref >60)
GFR calc non Af Amer: >60 mL/min (ref >60)
Glucose, Bld: 139 mg/dL — ABNORMAL HIGH (ref 70–99)
Potassium: 4.2 mmol/L (ref 3.5–5.1)
Sodium: 142 mmol/L (ref 135–145)
Total Bilirubin: 0.4 mg/dL (ref 0.3–1.2)
Total Protein: 7.3 g/dL (ref 6.5–8.1)

## 2020-01-21 LAB — GLUCOSE, CAPILLARY
Glucose-Capillary: 105 mg/dL — ABNORMAL HIGH (ref 70–99)
Glucose-Capillary: 117 mg/dL — ABNORMAL HIGH (ref 70–99)
Glucose-Capillary: 126 mg/dL — ABNORMAL HIGH (ref 70–99)
Glucose-Capillary: 129 mg/dL — ABNORMAL HIGH (ref 70–99)
Glucose-Capillary: 135 mg/dL — ABNORMAL HIGH (ref 70–99)
Glucose-Capillary: 136 mg/dL — ABNORMAL HIGH (ref 70–99)

## 2020-01-21 MED ORDER — VITAL 1.5 CAL PO LIQD
1000.0000 mL | ORAL | Status: DC
  Administered 2020-01-21 – 2020-01-27 (×4): 1000 mL
  Filled 2020-01-21 (×11): qty 1000

## 2020-01-21 MED ORDER — PROSOURCE TF PO LIQD
45.0000 mL | Freq: Every day | ORAL | Status: DC
  Administered 2020-01-22 – 2020-01-27 (×6): 45 mL
  Filled 2020-01-21 (×6): qty 45

## 2020-01-21 MED ORDER — ALUM & MAG HYDROXIDE-SIMETH 200-200-20 MG/5ML PO SUSP: 30.0000 mL | ORAL | Status: DC | PRN

## 2020-01-21 MED ORDER — CALCIUM POLYCARBOPHIL 625 MG PO TABS
625.0000 mg | ORAL_TABLET | Freq: Every day | ORAL | Status: DC
  Administered 2020-01-21 – 2020-02-23 (×30): 625 mg via NASOGASTRIC
  Filled 2020-01-21 (×31): qty 1

## 2020-01-21 MED ORDER — SACCHAROMYCES BOULARDII 250 MG PO CAPS
250.0000 mg | ORAL_CAPSULE | Freq: Two times a day (BID) | ORAL | Status: DC
  Administered 2020-01-21 – 2020-02-17 (×51): 250 mg via NASOGASTRIC
  Filled 2020-01-21 (×51): qty 1

## 2020-01-21 MED ORDER — FREE WATER
200.0000 mL | Freq: Four times a day (QID) | Status: DC
  Administered 2020-01-21 – 2020-02-04 (×47): 200 mL

## 2020-01-21 MED ORDER — METHYLPHENIDATE HCL 5 MG PO TABS
5.0000 mg | ORAL_TABLET | Freq: Two times a day (BID) | ORAL | Status: DC
  Administered 2020-01-21 – 2020-01-25 (×8): 5 mg via NASOGASTRIC
  Filled 2020-01-21 (×8): qty 1

## 2020-01-21 NOTE — Progress Notes (Signed)
Initial Nutrition Assessment  DOCUMENTATION CODES:   Not applicable  INTERVENTION:   Tube feeding via Cortrak: - Change to Vital 1.5 @ 65 ml/hr (can be held for up to 4 hours for therapies) - ProSource TF daily - Free water per MD/PA, currently 200 ml QID  Tube feeding regimen with current free water flushes provides 1990 kcal, 99 grams of protein, and 1793 ml of H2O (meets 100% of needs).  NUTRITION DIAGNOSIS:   Inadequate oral intake related to dysphagia, lethargy/confusion as evidenced by NPO status.  GOAL:   Patient will meet greater than or equal to 90% of their needs  MONITOR:   Diet advancement, Labs, Weight trends, TF tolerance, I & O's  REASON FOR ASSESSMENT:   Consult Enteral/tube feeding initiation and management  ASSESSMENT:   64 year old female who was admitted on 01/05/20 with right-sided headache and left hemiparesis. 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 Pt remains NPO due to signs of aspiration with tube feeds via Cortrak. Admitted to CIR on 9/08.   RD consulted for TF management. Pt with Cortrak in place, tip gastric per Cortrak team. Per MD consult, trial switching to more elemental formula "to help with loose stool, digestion." Discussed plan with RN.  Attempted to speak with pt at bedside. Pt resting soundly and muttering but did not wake up enough to purposefully interact with RD.  Current TF orders: Jevity 1.2 @ 70 ml/hr (can be held for up to 4 hours for therapies), ProSource TF 45 ml TID, free water 200 ml QID  Medications reviewed and include: SSI q 4 hours, protonix, senna, IV  abx  Labs reviewed: hemoglobin 8.3 CBG's: 115-166 x 24 hours  NUTRITION - FOCUSED PHYSICAL EXAM:    Most Recent Value  Orbital Region Mild depletion  Upper Arm Region No depletion  Thoracic and Lumbar Region No depletion  Buccal Region No depletion  Temple Region Mild depletion  Clavicle Bone Region Moderate depletion  Clavicle and Acromion Bone Region Moderate depletion  Scapular Bone Region Unable to assess  Dorsal Hand Mild depletion  Patellar Region Mild depletion  Anterior Thigh Region Mild depletion  Posterior Calf Region Mild depletion  Edema (RD Assessment) Mild  Hair Reviewed  Eyes Unable to assess  Mouth Unable to assess  Skin Reviewed  Nails Reviewed       Diet Order:   Diet Order            Diet NPO time specified  Diet effective now                 EDUCATION NEEDS:   No education needs have been identified at this time  Skin:  Skin Assessment: Reviewed RN Assessment  Last BM:  01/21/20 rectal tube  Height:   Ht Readings from Last 1 Encounters:  01/20/20 5\' 6"  (1.676 m)    Weight:   Wt Readings from Last 1 Encounters:  01/20/20 89.5 kg    Ideal Body Weight:  59.1 kg  BMI:  Body mass index is 31.85 kg/m.  Estimated Nutritional Needs:   Kcal:  1900-2100  Protein:  95-115 grams  Fluid:  >/= 1.9 L    Gaynell Face, MS,  RD, LDN Inpatient Clinical Dietitian Please see AMiON for contact information.

## 2020-01-21 NOTE — Evaluation (Signed)
Physical Therapy Assessment and Plan  Patient Details  Name: Erika Stanton MRN: [id number] Date of Birth: 05-02-1956  PT Diagnosis: Abnormality of gait, Hypertonia and Quadriplegia Rehab Potential: Fair ELOS: 4 weeks   Today's Date: 01/21/2020 PT Individual Time: 1300-1400 PT Individual Time Calculation (min): 60 min    Hospital Problem: Principal Problem:   Cerebral aneurysm rupture [facility])   Past Medical History: History reviewed. No pertinent past medical history. Past Surgical History:  Past Surgical History:  Procedure Laterality Date  . CHOLECYSTECTOMY    . IR 3D INDEPENDENT WKST  01/06/2020  . IR ANGIO INTRA EXTRACRAN SEL INTERNAL CAROTID BILAT MOD SED  01/06/2020  . IR ANGIO VERTEBRAL SEL VERTEBRAL UNI L MOD SED  01/06/2020  . IR ANGIOGRAM FOLLOW UP STUDY  01/06/2020  . IR ANGIOGRAM FOLLOW UP STUDY  01/06/2020  . IR ANGIOGRAM FOLLOW UP STUDY  01/06/2020  . IR ANGIOGRAM FOLLOW UP STUDY  01/06/2020  . IR ANGIOGRAM FOLLOW UP STUDY  01/06/2020  . IR ANGIOGRAM FOLLOW UP STUDY  01/06/2020  . IR ANGIOGRAM FOLLOW UP STUDY  01/06/2020  . IR ANGIOGRAM FOLLOW UP STUDY  01/06/2020  . IR ANGIOGRAM FOLLOW UP STUDY  01/06/2020  . IR ANGIOGRAM FOLLOW UP STUDY  01/06/2020  . IR NEURO EACH ADD'L AFTER BASIC UNI RIGHT (MS)  01/06/2020  . IR TRANSCATH/EMBOLIZ  01/06/2020  . RADIOLOGY WITH ANESTHESIA N/A 01/06/2020   Procedure: IR WITH ANESTHESIA FOR ANEURYSM;  Surgeon: Consuella Lose, MD;  Location: [place];  Service: Radiology;  Laterality: N/A;  . TUBAL LIGATION      Assessment & Plan Clinical Impression: [name]. [name] is a 64 year old female in relatively good health who was admitted on 01/05/2020 with right-sided headache and left hemiparesis.  History taken from chart review, nursing, and sister due to mentation.  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 Dr. NKathyrn Sheriffconsulted and patient underwent stent supported embolization of R-ICA aneurysm on 01/06/2020.  Post op with lethargy and maintained on nimotop and keppra. EEG done showed evidence of epileptogenicity arising from right temporal region as well as severe diffuse encephalopathy but no seizures. On ASA/Plavix due to stent.   She developed obtundation and follow up MRI brain done revealing development of acute cortical and subcortical infarct affecting left anterior frontal and left parietal cortex as well as punctate microemboli in right frontal and parietal regions. Slight increase in vasogenic edema noted without hydrocephalus. LT-EEG showed left cortical dysfunction felt to be due to underlying stroke- no seizures and Keppra was dced on 01/17/2020.  Nimotop added due to concerns of vasospasms and tube feeds started for nutritional support. As mentation improved, patient noted to have flaccid RUE with decrease in verbal output. She remains NPO due to signs of aspiration--MBS pending, has cognitive deficits with disorientation, unintelligible speech and bouts of lethargy. Therapy ongoing and patient required assistance to advance LLE for pregait activity. CIR recommended due to functional declineatient  Patient transferred to CIR on 01/20/2020 .   Patient currently requires total with mobility secondary to muscle weakness, decreased cardiorespiratoy endurance, impaired timing and sequencing, abnormal tone, unbalanced muscle activation, ataxia and decreased coordination, decreased visual perceptual skills and decreased visual motor skills, decreased midline orientation, decreased attention to right and decreased motor planning, decreased initiation, decreased attention, decreased awareness, decreased problem solving and  decreased safety awareness and decreased sitting balance, decreased standing balance, decreased postural control and decreased balance strategies.  Prior to  hospitalization, patient was total with mobility and lived with Spouse in a House home.  Home access is 4 steps front and side entryStairs to enter.  Patient will benefit from skilled PT intervention to maximize safe functional mobility, minimize fall risk and decrease caregiver burden for planned discharge home with 24 hour assist.  Anticipate patient will benefit from follow up [facility] at discharge.  PT - End of Session Activity Tolerance: Tolerates 30+ min activity with multiple rests Endurance Deficit: Yes Endurance Deficit Description: significant fatigue t/o session PT Assessment Rehab Potential (ACUTE/IP ONLY): Fair PT Barriers to Discharge: [place] home environment;Incontinence PT Barriers to Discharge Comments: Pt w/severe functional deficits PT Patient demonstrates impairments in the following area(s): Balance;Endurance;Motor;Perception;Safety;Skin Integrity;Sensory PT Transfers Functional Problem(s): Bed Mobility;Bed to Chair;Car PT Locomotion Functional Problem(s): Ambulation;Wheelchair Mobility;Stairs PT Plan PT Intensity: Minimum of 1-2 x/day ,45 to 90 minutes PT Frequency: 5 out of 7 days PT Duration Estimated Length of Stay: 4 weeks PT Treatment/Interventions: Ambulation/gait training;DME/adaptive equipment instruction;Neuromuscular re-education;Stair training;UE/LE Strength taining/ROM;Wheelchair propulsion/positioning;Balance/vestibular training;Discharge planning;Pain management;Skin care/wound management;Therapeutic Activities;UE/LE Coordination activities;Cognitive remediation/compensation;Functional mobility training;Patient/family education;Splinting/orthotics;Therapeutic Exercise;Visual/perceptual remediation/compensation PT Transfers Anticipated Outcome(s): min assist PT Locomotion Anticipated Outcome(s): min assist PT Recommendation Follow Up Recommendations: Home health PT;24 hour supervision/assistance Patient destination: Home Equipment Recommended: To be  determined Equipment Details: anticipate possible specialized wc needs   PT Evaluation Precautions/Restrictions Precautions Precautions: Fall Precaution Comments: NPO Restrictions Weight Bearing Restrictions: No General   Vital Signs Pain Pain Assessment Pain Scale: 0-10 Pain Score: 0-No pain Pain Type: Other (Comment) (pt unable to verbalize other than owe, my neck) Pain Location: Neck Pain Orientation: Left;Right Pain Descriptors / Indicators: Other (Comment) (uable) Pain Onset: With Activity Home Living/Prior Functioning Home Living Available Help at Discharge: Family;Available 24 hours/day Type of Home: House Home Access: Stairs to enter [employer] of Steps: 4 steps front and side entry Entrance Stairs-Rails: Right;Left;Can reach both Home Layout: One level Bathroom Shower/Tub: Product/process development scientist: Standard Bathroom Accessibility: Yes  Lives With: Spouse Prior Function Level of Independence: Independent with basic ADLs;Independent with homemaking with ambulation;Independent with gait;Independent with transfers  Able to Take Stairs?: Yes Driving: Yes Vocation: Full time employment Vision/Perception  Vision - Assessment Additional Comments: did not consistently visually track esp to R, appears to have difficulty focusing/tracking on therapist Perception Perception: Impaired Inattention/Neglect: Other (comment) (able to move eyes both right and left with cues - inconsisent attention to either side, poor ability to cross midline) Spatial Orientation: significant lateral lean to the left in sitting position Comments: cannot fully assess due to mentation but appears impaired in functional context Praxis Praxis: Impaired Praxis Impairment Details: Motor planning Praxis-Other Comments: needs to be further assessed as pt able.  Cognition Overall Cognitive Status: Impaired/Different from baseline Arousal/Alertness: Lethargic Orientation  Level: Other (comment) (very difficult to assess this pm, pt very dysarthric and mainly creates growling responses) Attention: Focused;Sustained Focused Attention: Impaired Sustained Attention: Impaired Sustained Attention Impairment: Verbal basic;Functional basic Memory: Impaired Immediate Memory Recall: Sock;Blue;Bed Memory Recall Sock: Without Cue Memory Recall Blue: Without Cue Memory Recall Bed: Without Cue Awareness: Impaired Awareness Impairment: Intellectual impairment Problem Solving: Impaired Problem Solving Impairment: Verbal basic;Functional basic Safety/Judgment: Impaired Comments: dysarthric speech limits understanding - she was able to recall writers name from start to end of 75 minute session Sensation Sensation Light Touch: Impaired by gross assessment Hot/Cold: Impaired by gross assessment Proprioception: Impaired  by gross assessment Additional Comments: pt could not participate in formal testing but appears to have deficits in bilat UEs R>L in functional context Coordination Gross Motor Movements are Fluid and Coordinated: No Fine Motor Movements are Fluid and Coordinated: No Coordination and Movement Description: RUE limited active movement w/poor hand function, <3/5 at shoulder and elbow, LUE able to grasp, flex elbow 3/5, grossly 2/5 at shoulder Finger Nose Finger Test: unable right or left Heel Shin Test: very poor coordination w/increased tone noted on L Motor  Motor Motor: Tetraplegia;Abnormal tone;Abnormal postural alignment and control Motor - Skilled Clinical Observations: see comments in sensation   Trunk/Postural Assessment  Cervical Assessment Cervical Assessment: Exceptions to [facility] Cervical AROM Cervical Flexion: only able to passively achieve upright, pt is not able to actively raise head Cervical - Left Rotation: pt maintains L cervical rotation, able to bring to midline w/passisve stretching in tilted position, cannot maintain, beyond midline pt  grimaces and resistance/spongy end feel Cervical PROM Overall Cervical PROM: see above Cervical Strength Overall Cervical Strength: Deficits Thoracic Assessment Thoracic Assessment: Exceptions to [facility] (pt sits w/L lean and rotation, cannot achieve upright) Lumbar Assessment Lumbar Assessment:  (poor postural control throughout trunk) Postural Control Postural Control: Deficits on evaluation  Balance Balance Balance Assessed: Yes Dynamic Sitting Balance Sitting balance - Comments: sits w/mod / max assist, rotation and lateral flexion to L, intermittent pushing w/RUE Extremity Assessment  RUE Assessment RUE Assessment: Exceptions to [facility] Passive Range of Motion (PROM) Comments: shoulder to 90 for flex and abd, ER 60, distal WFL, flexor tightness t/o Active Range of Motion (AROM) Comments: no volitional AROM noted General Strength Comments: very limited, will abduct slightlty at shoulder toward target but poor ability to control hand/elbow w/<2-3/5 LUE Assessment LUE Assessment: Exceptions to [facility] Passive Range of Motion (PROM) Comments: shoulder flex/abd approx 100-110, distal WFL, tight flexors Active Range of Motion (AROM) Comments: attempts to reach with left UE inconsistently, shoulder approx 1/4, elbow 1/2, gross grasp General Strength Comments: able to flex elbow 3-/5, shoulder grossly 2/5, slight grasp grossly 2/5 RLE Assessment RLE Assessment: Exceptions to [facility] General Strength Comments: able to extend at knee 3/5, flex hip <2+, add 2/5, global functional weakness noted LLE Assessment LLE Assessment: Exceptions to [facility] General Strength Comments: LLE strength and control < R, global deficits, scissoring w/gait due to abnormal tone  Care Tool Care Tool Bed Mobility Roll left and right activity   Roll left and right assist level: Total Assistance - Patient < 25%    Sit to lying activity   Sit to lying assist level: Total Assistance - Patient < 25%    Lying to sitting edge of  bed activity   Lying to sitting edge of bed assist level: Total Assistance - Patient < 25%     Care Tool Transfers Sit to stand transfer   Sit to stand assist level: 2 Helpers    Chair/bed transfer   Chair/bed transfer assist level: 2 Public relations account executive transfer activity did not occur: Safety/medical concerns        Care Tool Locomotion Ambulation   Assist level: 2 helpers Assistive device: Other (comment) (swedish walker) Max distance: 10  Walk 10 feet activity   Assist level: 2 helpers Assistive device: Other (comment) (swedish walker)   Walk 50 feet with 2 turns activity Walk 50 feet with 2 turns activity did not occur: Safety/medical concerns  Walk 150 feet activity Walk 150 feet activity did not occur: Safety/medical concerns      Walk 10 feet on uneven surfaces activity Walk 10 feet on uneven surfaces activity did not occur: Safety/medical concerns      Stairs Stair activity did not occur: Safety/medical concerns        Walk up/down 1 step activity Walk up/down 1 step or curb (drop down) activity did not occur: Safety/medical concerns     Walk up/down 4 steps activity did not occuR: Safety/medical concerns  Walk up/down 4 steps activity      Walk up/down 12 steps activity Walk up/down 12 steps activity did not occur: Safety/medical concerns      Pick up small objects from floor Pick up small object from the floor (from standing position) activity did not occur: Safety/medical concerns      Wheelchair Will patient use wheelchair at discharge?: Yes Type of Wheelchair: Manual   Wheelchair assist level: Dependent - Patient 0%    Wheel 50 feet with 2 turns activity Wheelchair 50 feet with 2 turns activity did not occur: Safety/medical concerns (requires TIS for postural support)    Wheel 150 feet activity Wheelchair 150 feet activity did not occur: Safety/medical concerns      Refer to Care Plan for Long Term  Goals  SHORT TERM GOAL WEEK 1 PT Short Term Goal 1 (Week 1): Pt will demonstrate static sit consistently w/min assist PT Short Term Goal 2 (Week 1): pt will perform STS w/mod assist of 2 PT Short Term Goal 3 (Week 1): Pt will ambulate 58f w/LRAD and max assist of 2 PT Short Term Goal 4 (Week 1): Pt will tolerate passive cervical rotation to R 20* PT Short Term Goal 5 (Week 1): Pt will tolerate 1-2 hrs OOB in wc  Recommendations for other services: None   Skilled Therapeutic Intervention  Pt initiallyw sleeping w/head rotated to L.  Easily awakened but difficulty opening eyes fully.  Pt gives slight nods in response to y/n questions, but speech is dysarthric and growling like in quality.   Pt required max assist for lower body dressing but was able to lift feet for threading pants and perform partial poor quality bridge as well as roll R/L total assist for dressing. Pt total assist to roll L and come to sitting on edge of bed but does initiate appropriateley w/this.  In sitting, pt initially pushing heavily to L, w/L lateral flexion and rotation throughout trunk.  Worked on reaching/leaning to R w/max assist and progresses to min assist w/intermittent max assist due to intermittent L lean/pushing.  Worked on cervical rotation and extension AAROM to PROM to achieve midline.  Pt noted to be unable to track visually during this. STS from edge of bed w/bed elevated w/max assist of 2, heavilty flexed cervical posture, moderate L throacic rotation. SPT bed to wc max assist of 2, min of 1. In sitting, pt tilted for trunk/cervical support and positioning.  Adjustements made to wc and seatbelt fastened.  Gait:   Max assist of 2, scissoring L, heavily flexed cervical /upper thoracic posture, poor stability at hips/trunk/knees, heavy reliance on SNetherlandswalker, third person for mgment of lines and wc.    Husband present during eval and discussed scheduling, purpose of all activities performed in todays  session, use of TIS wc for postural support and roho cushion for skin integrity/pressure relief.  Husband in agreement w/all activities.  At end of session, pt left OOB in wc in  tilt w/husband at bedside.  Nurse and NT notified of need for maximove transfer back to bed in approx 1 hr.  Instructed w/operation of TIS and process for placing harness when seated.  Nurse agreed to assist w/return to bed.  Mobility Bed Mobility Bed Mobility: Rolling Right;Rolling Left;Right Sidelying to Sit Rolling Right: Maximal Assistance - Patient 25-49% Rolling Left: Total Assistance - Patient < 25% Right Sidelying to Sit: Total Assistance - Patient < 25% Transfers Transfers: Sit to Stand;Stand Pivot Transfers Sit to Stand: 2 Helpers;Maximal Assistance - Patient 25-49% Stand Pivot Transfers: 2 Helpers;Maximal Assistance - Patient 25 - 49% Stand Pivot Transfer Details: Tactile cues for initiation;Tactile cues for posture;Verbal cues for sequencing;Manual facilitation for placement;Tactile cues for sequencing;Verbal cues for technique;Manual facilitation for weight bearing;Tactile cues for weight shifting;Manual facilitation for weight shifting Transfer (Assistive device): 2 person hand held assist Locomotion  Gait Ambulation: Yes Gait Assistance: 2 Helpers (3 helpers at eval) Social research officer, government (Feet): 10 Feet Assistive device: Other (Comment) (swedish walker) Gait Assistance Details: Tactile cues for initiation;Tactile cues for posture;Verbal cues for sequencing;Tactile cues for sequencing;Tactile cues for placement;Manual facilitation for weight bearing;Tactile cues for weight shifting;Tactile cues for weight beaing;Manual facilitation for weight shifting Gait Assistance Details: max assist of 2, third person to assist w/lines, wc, walker navigatio Gait Gait: Yes Gait Pattern: Impaired (pt w/scissoring LLE, heavily flexed cervical posture w/rotation to L, L lean, slow buckling bilat knees w/max cues for wt  shifting/trunk extension, verbal cues to extend/stabilize knees at loading to midstance) Gait velocity: very slow Stairs / Additional Locomotion Stairs: No   Discharge Criteria: Patient will be discharged from PT if patient refuses treatment 3 consecutive times without medical reason, if treatment goals not met, if there is a change in medical status, if patient makes no progress towards goals or if patient is discharged from hospital.  The above assessment, treatment plan, treatment alternatives and goals were discussed and mutually agreed upon: by family  Jerrilyn Cairo 01/21/2020, 4:09 PM

## 2020-01-21 NOTE — Progress Notes (Signed)
Inpatient Rehabilitation  Patient information reviewed and entered into eRehab system by Alaynah Schutter M. Janoah Menna, M.A., CCC/SLP, PPS Coordinator.  Information including medical coding, functional ability and quality indicators will be reviewed and updated through discharge.    

## 2020-01-21 NOTE — Evaluation (Signed)
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 No pertinent past medical history. Past Surgical History:  Past Surgical History:  Procedure Laterality Date  . CHOLECYSTECTOMY    . IR 3D INDEPENDENT WKST  01/06/2020  . IR ANGIO INTRA EXTRACRAN SEL INTERNAL CAROTID BILAT MOD SED  01/06/2020  . IR ANGIO VERTEBRAL SEL VERTEBRAL UNI L MOD SED  01/06/2020  . IR ANGIOGRAM FOLLOW UP STUDY  01/06/2020  . IR ANGIOGRAM FOLLOW UP STUDY  01/06/2020  . IR ANGIOGRAM FOLLOW UP STUDY  01/06/2020  . IR ANGIOGRAM FOLLOW UP STUDY  01/06/2020  . IR ANGIOGRAM FOLLOW UP STUDY  01/06/2020  . IR ANGIOGRAM FOLLOW UP STUDY  01/06/2020  . IR ANGIOGRAM FOLLOW UP STUDY  01/06/2020  . IR ANGIOGRAM FOLLOW UP STUDY  01/06/2020  . IR ANGIOGRAM FOLLOW UP STUDY  01/06/2020  . IR ANGIOGRAM FOLLOW UP STUDY  01/06/2020  . IR NEURO EACH ADD'L AFTER BASIC UNI RIGHT (MS)  01/06/2020  . IR TRANSCATH/EMBOLIZ  01/06/2020  . RADIOLOGY WITH ANESTHESIA N/A 01/06/2020   Procedure: IR WITH ANESTHESIA FOR ANEURYSM;  Surgeon: Consuella Lose, MD;  Location: [place];  Service: Radiology;  Laterality: N/A;  . TUBAL LIGATION      Assessment & Plan Clinical Impression: Patient is a 64 y.o. year old female with right-sided headache and left hemiparesis.  History taken from chart review, nursing, and sister due to mentation.  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 Dr. NKathyrn Sheriffconsulted and patient underwent stent supported embolization of R-ICA aneurysm on 01/06/2020.  Post op with lethargy and maintained on nimotop and keppra. EEG done showed evidence of epileptogenicity arising from right temporal region as well as severe diffuse encephalopathy but no seizures. On ASA/Plavix due to stent.   She developed obtundation and follow up MRI brain done revealing development of acute cortical and subcortical infarct affecting left anterior frontal and left parietal cortex as well as punctate microemboli in right frontal and parietal regions. Slight increase in vasogenic edema noted without hydrocephalus. LT-EEG showed left cortical dysfunction felt to be due to underlying stroke- no seizures and Keppra was dced on 01/17/2020.  Nimotop added due to concerns of vasospasms and tube feeds started for nutritional support. As mentation improved, patient noted to have flaccid RUE with decrease in verbal output. She remains NPO due to signs of aspiration--MBS pending, has cognitive deficits with disorientation, unintelligible speech and bouts of lethargy. Therapy ongoing and patient required assistance to advance LLE for pregait activity.    Patient transferred to CIR on 01/20/2020 .    Patient currently requires total with basic self-care skills and IADL secondary to muscle weakness, muscle joint tightness and muscle paralysis, decreased cardiorespiratoy endurance, abnormal tone, unbalanced muscle activation, decreased coordination and decreased motor planning, decreased visual perceptual skills and decreased visual motor skills, decreased midline orientation, decreased attention to left, decreased attention to  right and decreased motor planning, decreased problem solving, decreased safety awareness and delayed processing and decreased sitting balance, decreased standing balance, decreased postural  control and hemiplegia.  Prior to hospitalization, patient could complete ADL/iADL with independent .  Patient will benefit from skilled intervention to decrease level of assist with basic self-care skills, increase independence with basic self-care skills and increase level of independence with iADL prior to discharge home with care partner.  Anticipate patient will require 24 hour supervision and minimal physical assistance and follow up home health.  OT - End of Session Activity Tolerance: Tolerates 10 - 20 min activity with multiple rests Endurance Deficit: Yes Endurance Deficit Description: significant fatigue t/o session OT Assessment OT Patient demonstrates impairments in the following area(s): Balance;Cognition;Endurance;Motor;Nutrition;Perception;Safety;Sensory;Vision OT Basic ADL's Functional Problem(s): Eating;Grooming;Bathing;Dressing;Toileting OT Transfers Functional Problem(s): Toilet;Tub/Shower OT Additional Impairment(s): Fuctional Use of Upper Extremity OT Plan OT Intensity: Minimum of 1-2 x/day, 45 to 90 minutes OT Frequency: 5 out of 7 days OT Duration/Estimated Length of Stay: 4-5 weeks OT Treatment/Interventions: Balance/vestibular training;Neuromuscular re-education;Self Care/advanced ADL retraining;Therapeutic Exercise;Wheelchair propulsion/positioning;Cognitive remediation/compensation;DME/adaptive equipment instruction;Pain management;Skin care/wound managment;UE/LE Strength taining/ROM;Community reintegration;Patient/family education;Splinting/orthotics;UE/LE Coordination activities;Discharge planning;Functional mobility training;Therapeutic Activities;Visual/perceptual remediation/compensation OT Self Feeding Anticipated Outcome(s): supervision OT Basic Self-Care Anticipated Outcome(s): min a OT Toileting Anticipated Outcome(s): min a OT Bathroom Transfers Anticipated Outcome(s): min A OT Recommendation Patient destination: Home Follow Up Recommendations: Home  health OT Equipment Recommended: 3 in 1 bedside comode;Tub/shower bench   OT Evaluation Precautions/Restrictions  Precautions Precautions: Fall Precaution Comments: NPO Restrictions Weight Bearing Restrictions: No   Pain Pain Assessment Pain Scale: 0-10 Pain Score: 0-No pain Pain Type: Other (Comment) (pt unable to verbalize other than owe, my neck) Pain Location: Neck Pain Orientation: Left;Right Pain Descriptors / Indicators: Other (Comment) (uable) Pain Onset: With Activity Home Living/Prior [place] expects to be discharged to:: Private residence Living Arrangements: Spouse/significant other Available Help at Discharge: Family, Available 24 hours/day Type of Home: House Home Access: Stairs to enter [employer] of Steps: 4 steps front and side entry Entrance Stairs-Rails: Right, Left, Can reach both Home Layout: One level Bathroom Shower/Tub: Tub/shower unit, Industrial/product designer: Yes  Lives With: Spouse Prior Function Level of Independence: Independent with basic ADLs, Independent with homemaking with ambulation, Independent with gait, Independent with transfers  Able to Take Stairs?: Yes Driving: Yes Vocation: Full time employment Vision Baseline Vision/History: Wears glasses Wears Glasses: Reading only Patient Visual Report: Other (comment) (patient with difficulty keeping eyes open, poor ability to focus, she attempts to see time on clock but unable) Vision Assessment?: Vision impaired- to be further tested in functional context Additional Comments: did not consistently visually track esp to R, appears to have difficulty focusing/tracking on therapist Perception  Perception: Impaired Inattention/Neglect: Other (comment) (able to move eyes both right and left with cues - inconsisent attention to either side, poor ability to cross midline) Spatial Orientation: significant lateral lean  to the left in sitting position Comments: cannot fully assess due to mentation but appears impaired in functional context Praxis Praxis: Impaired Praxis Impairment Details: Motor planning Praxis-Other Comments: needs to be further assessed as pt able. Cognition Overall Cognitive Status: Impaired/Different from baseline Arousal/Alertness: Lethargic Orientation Level: Person;Place;Situation Person: Oriented Place: Oriented Situation: Oriented Year: 2021 Month: September Day of Week: Incorrect Memory: Impaired Immediate Memory Recall: Sock;Blue;Bed Memory Recall Sock: Without Cue Memory Recall Blue: Without Cue Memory Recall Bed: Without Cue Attention: Focused;Sustained Focused Attention: Impaired Sustained Attention: Impaired Sustained Attention Impairment: Verbal basic;Functional  basic Awareness: Impaired Awareness Impairment: Intellectual impairment Problem Solving: Impaired Problem Solving Impairment: Verbal basic;Functional basic Safety/Judgment: Impaired Comments: dysarthric speech limits understanding - she was able to recall writers name from start to end of 75 minute session Sensation Sensation Light Touch: Impaired by gross assessment Hot/Cold: Impaired by gross assessment Proprioception: Impaired by gross assessment Additional Comments: pt could not participate in formal testing but appears to have deficits in bilat UEs R>L in functional context Coordination Gross Motor Movements are Fluid and Coordinated: No Fine Motor Movements are Fluid and Coordinated: No Coordination and Movement Description: RUE limited active movement w/poor hand function, <3/5 at shoulder and elbow, LUE able to grasp, flex elbow 3/5, grossly 2/5 at shoulder Finger Nose Finger Test: unable right or left Heel Shin Test: very poor coordination w/increased tone noted on L Motor  Motor Motor: Tetraplegia;Abnormal tone;Abnormal postural alignment and control Motor - Skilled Clinical  Observations: see comments in sensation  Trunk/Postural Assessment  Cervical Assessment Cervical Assessment: Exceptions to [facility] Cervical AROM Cervical Flexion: only able to passively achieve upright, pt is not able to actively raise head Cervical - Left Rotation: pt maintains L cervical rotation, able to bring to midline w/passisve stretching in tilted position, cannot maintain, beyond midline pt grimaces and resistance/spongy end feel Cervical PROM Overall Cervical PROM: see above Cervical Strength Overall Cervical Strength: Deficits Thoracic Assessment Thoracic Assessment: Exceptions to [facility] (pt sits w/L lean and rotation, cannot achieve upright) Lumbar Assessment Lumbar Assessment:  (poor postural control throughout trunk) Postural Control Postural Control: Deficits on evaluation  Balance Balance Balance Assessed: Yes Dynamic Sitting Balance Sitting balance - Comments: sits w/mod / max assist, rotation and lateral flexion to L, intermittent pushing w/RUE Extremity/Trunk Assessment RUE Assessment RUE Assessment: Exceptions to [facility] Passive Range of Motion (PROM) Comments: shoulder to 90 for flex and abd, ER 60, distal WFL, flexor tightness t/o Active Range of Motion (AROM) Comments: no volitional AROM noted General Strength Comments: very limited, will abduct slightlty at shoulder toward target but poor ability to control hand/elbow w/<2-3/5 LUE Assessment LUE Assessment: Exceptions to [facility] Passive Range of Motion (PROM) Comments: shoulder flex/abd approx 100-110, distal WFL, tight flexors Active Range of Motion (AROM) Comments: attempts to reach with left UE inconsistently, shoulder approx 1/4, elbow 1/2, gross grasp General Strength Comments: able to flex elbow 3-/5, shoulder grossly 2/5, slight grasp grossly 2/5  Care Tool Care Tool Self Care Eating   Eating Assist Level: Dependent - Patient 0%    Oral Care    Oral Care Assist Level: Dependent - Patient 0%)    Bathing      Body parts bathed by helper: Right arm;Left arm;Chest;Abdomen;Front perineal area;Buttocks;Right upper leg;Left upper leg;Right lower leg;Left lower leg;Face   Assist Level: Dependent - Patient 0%    Upper Body Dressing(including orthotics)   What is the patient wearing?: Pull over shirt   Assist Level: Dependent - Patient 0%    Lower Body Dressing (excluding footwear)   What is the patient wearing?: Pants Assist for lower body dressing: Total Assistance - Patient < 25%    Putting on/Taking off footwear   What is the patient wearing?: Non-skid slipper socks Assist for footwear: Dependent - Patient 0%       Care Tool Toileting Toileting activity   Assist for toileting: Dependent - Patient 0%     Care Tool Bed Mobility Roll left and right activity        Sit to lying activity  Lying to sitting edge of bed activity         Care Tool Transfers Sit to stand transfer        Chair/bed transfer         Toilet transfer         Care Tool Cognition Expression of Ideas and Wants Expression of Ideas and Wants: Frequent difficulty - frequently exhibits difficulty with expressing needs and ideas   Understanding Verbal and Non-Verbal Content Understanding Verbal and Non-Verbal Content: Sometimes understands - understands only basic conversations or simple, direct phrases. Frequently requires cues to understand   Memory/Recall Ability *first 3 days only Memory/Recall Ability *first 3 days only: That he or she is in a hospital/hospital unit;Current season    Refer to Care Plan for [place] 1 OT Short Term Goal 1 (Week 1): patient will roll in bed with min A, move from side lying to sitting edge of bed with mod A of one OT Short Term Goal 2 (Week 1): patient will tolerate unsupported sitting with min A for 10 minutes with head midline OT Short Term Goal 3 (Week 1): patient will use left UE to wash 25% of upper body and assist with donning OH  shirt max A OT Short Term Goal 4 (Week 1): patient will scan environment both right and left to locate items with moderate cues  Recommendations for other services: None    Skilled Therapeutic Intervention  Patient in bed, aroused with mild stimulation.   Husband entered within the first 10 minutes of therapy session.  Evaluation completed as documented above.  She presents with impaired head/trunk control, bilateral UE motor control, visual deficits and endurance limitations which impact all aspects of mobility and self care.  She demonstrates good awareness of her situation and date, she is able to recall information provided during the session, dysarthric speech limits communication.  She is an excellent candidate for IP rehab to maximize independence and facilitate a safe return to home with family.  Anticipate assistance needs and further therapy post discharge.  Reviewed role of OT, plan of care, evaluation process, therapy schedule, appropriate clothing for rehab level, goals for therapy.  Patient will need review, husband demonstrates good understanding but states that he is overwhelmed and worried about the future - explained therapy team and process for meeting to review progress and adjust plans as needed.  Patient attempted to participate in self care activities with focus on head positioning, rolling in bed, utilizing left UE - discussed strategies with husband for improved head position in bed and hand mobility that he could assist with .  Removed pants due to upcoming appointment/procedure.  She remained in bed at close of session, bed alarm set and call bell reachable - she is unable to use at this time - nursing staff to order soft round call bell.    ADL ADL Eating: NPO Grooming: Dependent Where Assessed-Grooming: Bed level Upper Body Bathing: Dependent Where Assessed-Upper Body Bathing: Bed level Lower Body Bathing: Dependent Where Assessed-Lower Body Bathing: Bed level Upper  Body Dressing: Dependent Where Assessed-Upper Body Dressing: Edge of bed Lower Body Dressing: Maximal assistance;Dependent Where Assessed-Lower Body Dressing: Bed level Toileting: Dependent Where Assessed-Toileting: Bed level ADL Comments: patient able to hold wash cloth with left hand but unable to manipulate for washing, she is able to lift each leg for donning pants/socks Mobility  Bed Mobility Bed Mobility: Rolling Right;Rolling Left;Right Sidelying to Sit Rolling Right: Maximal Assistance -  Patient 25-49% Rolling Left: Total Assistance - Patient < 25% Right Sidelying to Sit: Total Assistance - Patient < 25% Transfers Sit to Stand: 2 Helpers;Maximal Assistance - Patient 25-49%   Discharge Criteria: Patient will be discharged from OT if patient refuses treatment 3 consecutive times without medical reason, if treatment goals not met, if there is a change in medical status, if patient makes no progress towards goals or if patient is discharged from hospital.  The above assessment, treatment plan, treatment alternatives and goals were discussed and mutually agreed upon: by patient and by family  Carlos Levering 01/21/2020, 3:58 PM

## 2020-01-21 NOTE — Progress Notes (Signed)
Bilateral lower extremity venous duplex has been completed. Preliminary results can be found in CV Proc through chart review.   01/21/20 12:43 PM Erika Stanton RVT

## 2020-01-21 NOTE — Progress Notes (Signed)
Patient Details  Name: Erika Stanton MRN: [id number] Date of Birth: 21-Jul-1955  Today's Date: 01/21/2020  Hospital Problems: Principal Problem:   Cerebral aneurysm rupture [facility])  Past Medical History: History reviewed. No pertinent past medical history. Past Surgical History:  Past Surgical History:  Procedure Laterality Date  . CHOLECYSTECTOMY    . IR 3D INDEPENDENT WKST  01/06/2020  . IR ANGIO INTRA EXTRACRAN SEL INTERNAL CAROTID BILAT MOD SED  01/06/2020  . IR ANGIO VERTEBRAL SEL VERTEBRAL UNI L MOD SED  01/06/2020  . IR ANGIOGRAM FOLLOW UP STUDY  01/06/2020  . IR ANGIOGRAM FOLLOW UP STUDY  01/06/2020  . IR ANGIOGRAM FOLLOW UP STUDY  01/06/2020  . IR ANGIOGRAM FOLLOW UP STUDY  01/06/2020  . IR ANGIOGRAM FOLLOW UP STUDY  01/06/2020  . IR ANGIOGRAM FOLLOW UP STUDY  01/06/2020  . IR ANGIOGRAM FOLLOW UP STUDY  01/06/2020  . IR ANGIOGRAM FOLLOW UP STUDY  01/06/2020  . IR ANGIOGRAM FOLLOW UP STUDY  01/06/2020  . IR ANGIOGRAM FOLLOW UP STUDY  01/06/2020  . IR NEURO EACH ADD'L AFTER BASIC UNI RIGHT (MS)  01/06/2020  . IR TRANSCATH/EMBOLIZ  01/06/2020  . RADIOLOGY WITH ANESTHESIA N/Erika 01/06/2020   Procedure: IR WITH ANESTHESIA FOR ANEURYSM;  Surgeon: Consuella Lose, MD;  Location: [place];  Service: Radiology;  Laterality: N/Erika;  . TUBAL LIGATION     Social History:  reports that she has quit smoking. She has never used smokeless tobacco. No history on file for alcohol use and drug use.  Family / Support Systems Marital Status: Married How Long?: 7 yrs; together for 17 years Patient Roles: Spouse Spouse/Significant Other: Erika Stanton (husband) [phone] Children: No children Other Supports: some friends Anticipated Caregiver: Husband Ability/Limitations of Caregiver: Knee and back issues. Will not be able to do alot of heavy lifting Caregiver Availability: 24/7 Family Dynamics: Pt lives at home with her husband.  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, nutritional of Employment: 6 Return to Work Plans: Husband believes pt may likely retire Public relations account executive Issues: Denies Guardian/Conservator: N/Erika   Abuse/Neglect Abuse/Neglect Assessment Can Be Completed: Unable to assess, patient is non-responsive or altered mental status Physical Abuse: Denies Verbal Abuse: Denies Sexual Abuse: Denies Exploitation of patient/patient's resources: Denies Self-Neglect: Denies  Emotional Status Pt's affect, behavior and adjustment status: N/Erika Recent Psychosocial Issues: Husband Denies Psychiatric History: Husband Denies Substance Abuse History: Husband denies; occassional beer  Patient / Family Perceptions, Expectations & Goals Pt/Family understanding of illness & functional limitations: Pt husband has Erika general understandig of care needs Premorbid pt/family roles/activities: Independent Anticipated changes in roles/activities/participation: Assistance with ADLs/IADLs Pt/family expectations/goals: Pt husband would like for pt to come home and be able to provide some of her own self care needs if possible such as brushing teeth, being able to get up to bathroom on own. Nothing that requires heavy lifting.  Community Resources [employer] Scripts: None Premorbid Home Care/DME Agencies: None Transportation available at discharge: Husband Resource referrals recommended: Neuropsychology  Discharge Planning Living Arrangements: Spouse/significant other Support Systems: Spouse/significant other Type of Residence: Private residence Administrator, sports: Multimedia programmer (specify) Nurse, mental health) Financial Resources: Employment, Secondary school teacher Screen Referred: No Living Expenses: Own Money Management:  Patient Does the patient have any problems obtaining your  medications?: No Home Management: Both split housekeeping and meal prep tasks Patient/Family Preliminary Plans: Pt husband has begun to manage finances. Care Coordinator Barriers to Discharge: Decreased caregiver support, Lack of/limited family support Care Coordinator Anticipated Follow Up Needs: HH/OP Expected length of stay: 4 weeks  Clinical Impression SW completed assessment with pt husband due to current condition. 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 Pt husband is HCPOA. Lives in Erika ranch style home with 4 steps to enter and F/B entrance; no sidewalk with gravel driveway that leads to grass and then to porch. DME: cane. Husband has already completed FMLA, and working on STD/LTD through employer.   Erika Stanton Erika Stanton 01/21/2020, 5:17 PM

## 2020-01-21 NOTE — Progress Notes (Signed)
At 2158 MEWS score 2 (yellow), change in HR and BP elevated. Patient has been restless and fidgeting continuously when not asleep. Patient is now alert and oriented x4. Slurred speech noted, however, pt able to state she is at [facility] following an aneurysm and accurately states the year. Patient arousable, no s/s of distress.

## 2020-01-21 NOTE — Evaluation (Signed)
Speech Language Pathology Assessment and Plan  Patient Details  Name: Erika Stanton MRN: [id number] Date of Birth: 1956/04/03  SLP Diagnosis: Dysarthria;Dysphagia;Cognitive Impairments  Rehab Potential: Good ELOS: 4 weeks    Today's Date: 01/21/2020 SLP Individual Time: 7564-3329 SLP Individual Time Calculation (min): 12 min   Hospital Problem: Principal Problem:   Cerebral aneurysm rupture [facility])  Past Medical History: History reviewed. No pertinent past medical history. Past Surgical History:  Past Surgical History:  Procedure Laterality Date  . CHOLECYSTECTOMY    . IR 3D INDEPENDENT WKST  01/06/2020  . IR ANGIO INTRA EXTRACRAN SEL INTERNAL CAROTID BILAT MOD SED  01/06/2020  . IR ANGIO VERTEBRAL SEL VERTEBRAL UNI L MOD SED  01/06/2020  . IR ANGIOGRAM FOLLOW UP STUDY  01/06/2020  . IR ANGIOGRAM FOLLOW UP STUDY  01/06/2020  . IR ANGIOGRAM FOLLOW UP STUDY  01/06/2020  . IR ANGIOGRAM FOLLOW UP STUDY  01/06/2020  . IR ANGIOGRAM FOLLOW UP STUDY  01/06/2020  . IR ANGIOGRAM FOLLOW UP STUDY  01/06/2020  . IR ANGIOGRAM FOLLOW UP STUDY  01/06/2020  . IR ANGIOGRAM FOLLOW UP STUDY  01/06/2020  . IR ANGIOGRAM FOLLOW UP STUDY  01/06/2020  . IR ANGIOGRAM FOLLOW UP STUDY  01/06/2020  . IR NEURO EACH ADD'L AFTER BASIC UNI RIGHT (MS)  01/06/2020  . IR TRANSCATH/EMBOLIZ  01/06/2020  . RADIOLOGY WITH ANESTHESIA N/A 01/06/2020   Procedure: IR WITH ANESTHESIA FOR ANEURYSM;  Surgeon: Consuella Lose, MD;  Location: [place];  Service: Radiology;  Laterality: N/A;  . TUBAL LIGATION      Assessment / Plan / Recommendation Clinical Impression Patient is a 64 year old female in relatively good health who was admitted on 01/05/20 with onset of right headache followed by left sided weakness. 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 Dr. NKathyrn Sheriffconsulted  and patient underwent stent supported embolization of R-ICA aneurysm on 08/25. Post op with lethargy and maintained on Nimotop and Keppra. EEG done showed evidence of epileptogenicity arising from right temporal region as well as severe diffuse encephalopathy but no seizures. On ASA/Plavix due to stent. She developed obtundation and follow up MRI brain done revealing development of acute cortical and subcortical infarct affecting left anterior frontal and left parietal cortex as well as punctate microemboli in right frontal and parietal regions. Slight increase in vasogenic edema noted without hydrocephalus.LT-EEG showed left cortical dysfunction felt to be due to underlying stroke- no seizures and Keppra was d/c on 09/05. Nimotop added due to concerns of vasospasms and tube feeds started for nutritional support. As mentation improved, patient noted to have flaccid RUE with decrease in verbal output. She remains NPO due to signs of aspiration--MBS pending, has cognitive deficits with disorientation, unintelligible speech and bouts of lethargy. CIR recommended and patient admitted 01/20/20.  Patient was lethargic throughout the evaluation but was able to answer orientation questions and biographical questions appropriately at the word and phrase level indicative on intact language function. However, patient's speech was severely dysarthric due to imprecise consonants which was exacerbated by fatigue. Patient able to follow commands in 50% of opportunities but demonstrated difficulty maintaining arousal and keeping her eyes open throughout session. Patient consumed minimal amounts of ice chips due to lethargy but able to masticate bolus with prolonged oral transit and suspected delayed swallow initiation. Patient's ability to participate in an objective  swallow assessment is impacted by ongoing lethargy and a severe head flexion and rotation to the left. Recommend patient remain NPO with ongoing trials with SLP with  continued focus on arousal and improved head positioning for PO intake. Patient would benefit from skilled SLP intervention to maximize her cognitive, speech and swallowing function prior to discharge.    Skilled Therapeutic Interventions          Administered a cognitive-linguistic evaluation and BSE, please see above for details.   SLP Assessment  Patient will need skilled [facility] during CIR admission    Recommendations  SLP Diet Recommendations: NPO;Alternative means - temporary Medication Administration: Via alternative means Oral Care Recommendations: Oral care QID Patient destination: Home Follow up Recommendations: Home Health SLP;24 hour supervision/assistance Equipment Recommended: To be determined    SLP Frequency 3 to 5 out of 7 days   SLP Duration  SLP Intensity  SLP Treatment/Interventions 4 weeks  Minumum of 1-2 x/day, 30 to 90 minutes  Cognitive remediation/compensation;Dysphagia/aspiration precaution training;Internal/external aids;Speech/Language facilitation;Therapeutic Activities;Environmental controls;Cueing hierarchy;Functional tasks;Patient/family education    Pain Pain Assessment Pain Scale: 0-10 Pain Type: Other (Comment) (pt unable to verbalize other than owe, my neck) Pain Location: Neck Pain Orientation: Left;Right Pain Descriptors / Indicators: Other (Comment) (uable) Pain Onset: With Activity  Prior Functioning Type of Home: House  Lives With: Spouse Available Help at Discharge: Family;Available 24 hours/day  SLP Evaluation Cognition Overall Cognitive Status: Impaired/Different from baseline Arousal/Alertness: Lethargic Orientation Level: Oriented X4 (name, age, month, year, place and situation) Attention: Focused;Sustained Focused Attention: Appears intact Sustained Attention: Impaired Sustained Attention Impairment: Verbal basic;Functional basic Memory: Impaired Awareness: Impaired Awareness Impairment:  Intellectual impairment Problem Solving: Impaired Problem Solving Impairment: Verbal basic;Functional basic Safety/Judgment: Impaired  Comprehension Auditory Comprehension Overall Auditory Comprehension: Impaired Yes/No Questions: Impaired Basic Biographical Questions: 76-100% accurate Commands: Impaired One Step Basic Commands: 50-74% accurate Conversation: Simple Interfering Components: Attention;Processing speed;Working Field seismologist: Public house manager: Not tested Reading Comprehension Reading Status: Not tested Expression Expression Primary Mode of Expression: Verbal Verbal Expression Overall Verbal Expression: Appears within functional limits for tasks assessed Interfering Components: Speech intelligibility;Attention Written Expression Dominant Hand: Right Written Expression: Unable to assess (comment) Oral Motor Oral Motor/Sensory Function Overall Oral Motor/Sensory Function: Moderate impairment Motor Speech Overall Motor Speech: Impaired Respiration: Impaired Level of Impairment: Word Phonation: Low vocal intensity Articulation: Impaired Level of Impairment: Word Intelligibility: Intelligibility reduced Word: 50-74% accurate Phrase: 25-49% accurate Motor Planning: Witnin functional limits Effective Techniques: Slow rate;Increased vocal intensity;Over-articulate;Pause  Care Tool Care Tool Cognition Expression of Ideas and Wants Expression of Ideas and Wants: Frequent difficulty - frequently exhibits difficulty with expressing needs and ideas   Understanding Verbal and Non-Verbal Content Understanding Verbal and Non-Verbal Content: Sometimes understands - understands only basic conversations or simple, direct phrases. Frequently requires cues to understand   Memory/Recall Ability *first 3 days only Memory/Recall Ability *first 3 days only: That he or she is in a  hospital/hospital unit;Current season      Bedside Swallowing Assessment General Date of Onset: 01/05/20 Previous Swallow Assessment: BSE, Recommended to remain NPO Diet Prior to this Study: NPO;NG Tube Temperature Spikes Noted: No Respiratory Status: Room air History of Recent Intubation: No Behavior/Cognition: Lethargic/Drowsy;Requires cueing;Distractible Oral Cavity - Dentition: Adequate natural dentition Self-Feeding Abilities: Total assist Vision:  (TBD) Patient Positioning: Upright in bed Baseline Vocal Quality: Low vocal intensity Volitional Swallow: Able to elicit  Ice Chips Ice chips: Impaired Presentation: Spoon Oral Phase Impairments: Reduced labial seal  Oral Phase Functional Implications: Prolonged oral transit Pharyngeal Phase Impairments: Throat Clearing - Delayed;Suspected delayed Swallow Thin Liquid Thin Liquid: Not tested Nectar Thick Nectar Thick Liquid: Not tested Honey Thick Honey Thick Liquid: Not tested Puree Puree: Not tested Solid Solid: Not tested BSE Assessment Risk for Aspiration Impact on safety and function: Moderate aspiration risk;Severe aspiration risk Other Related Risk Factors: Lethargy;Cognitive impairment  Short Term Goals: Week 1: SLP Short Term Goal 1 (Week 1): Patient will increase speech intelligibility to ~50% at the phrase level with Max A multimodal cues for use of speech intelligibility strategies. SLP Short Term Goal 2 (Week 1): Patient will consume trials of ice chips with minimal overt s/s of aspiration over 2 sessions with Mod A vebral cues to assess readiness for repeat MBS SLP Short Term Goal 3 (Week 1): Patient will attend to a functional task for ~5 minutes with Max A multimodal cues.  Refer to Care Plan for Long Term Goals  Recommendations for other services: None   Discharge Criteria: Patient will be discharged from SLP if patient refuses treatment 3 consecutive times without medical reason, if treatment goals not  met, if there is a change in medical status, if patient makes no progress towards goals or if patient is discharged from hospital.  The above assessment, treatment plan, treatment alternatives and goals were discussed and mutually agreed upon: No family available/patient unable  [place], [place] 01/21/2020, 3:20 PM

## 2020-01-21 NOTE — Progress Notes (Signed)
Patient ID: KAYLENN CIVIL, female   DOB: [birth year], 64 y.o.   MRN: [id number]  SW spoke with pt husband Ronalee Belts [phone]) to introduce self, explain role, and discuss discharge process. SW completed assessment with pt husband due to speech impairments. SW discussed with pt husband ELOS atleast 4 weeks. SW informed there will be follow-up after team conference on Tuesday with more details. He did express he would like to speak with a medical doctor with regard to his wife's current condition.  Loralee Pacas, MSW, [place] Office: [phone] Cell: [phone] Fax: [phone]

## 2020-01-21 NOTE — Care Management (Addendum)
[facility] Individual Statement of Services  Patient Name:  Erika Stanton  Date:  01/21/2020  Welcome to the [facility].  Our goal is to provide you with an individualized program based on your diagnosis and situation, designed to meet your specific needs.  With this comprehensive rehabilitation program, you will be expected to participate in at least 3 hours of rehabilitation therapies Monday-Friday, with modified therapy programming on the weekends.  Your rehabilitation program will include the following services:  Physical Therapy (PT), Occupational Therapy (OT), Speech Therapy (ST), 24 hour per day rehabilitation nursing, Therapeutic Recreaction (TR), Psychology, Neuropsychology, Care Coordinator, Rehabilitation Medicine, Nutrition Services, Pharmacy Services and Other  Weekly team conferences will be held on Tuesdays to discuss your progress.  Your Inpatient Rehabilitation Care Coordinator will talk with you frequently to get your input and to update you on team discussions.  Team conferences with you and your family in attendance may also be held.  Expected length of stay: 4 weeks   Overall anticipated outcome: Minimal Assistance  Depending on your progress and recovery, your program may change. Your Inpatient Rehabilitation Care Coordinator will coordinate services and will keep you informed of any changes. Your Inpatient Rehabilitation Care Coordinator's name and contact numbers are listed  below.  The following services may also be recommended but are not provided by the [facility]:    [place] will be made to provide these services after discharge if needed.  Arrangements include referral to agencies that provide these services.  Your insurance has been verified to be:  [employer]  Your primary doctor is:  Physicist, medical  Pertinent information  will be shared with your doctor and your insurance company.  Inpatient Rehabilitation Care Coordinator:  Cathleen Corti [phone] or [phone]  Information discussed with and copy given to patient by: Rana Snare, 01/21/2020, 4:35 PM

## 2020-01-21 NOTE — Progress Notes (Addendum)
[facility] PHYSICAL MEDICINE & REHABILITATION PROGRESS NOTE   Subjective/Complaints: Pt restless overnight per RN. Still quite lethargic  ROS: Limited due to cognitive/behavioral    Objective:   VAS [place] TRANSCRANIAL DOPPLER  Result Date: 01/19/2020  Transcranial Doppler Indications: Subarachnoid hemorrhage. Limitations: patient positioning/ movement Comparison Study: 01/15/20 previous Performing Technologist: Abram Sander RVS  Examination Guidelines: A complete evaluation includes B-mode imaging, spectral Doppler, color Doppler, and power Doppler as needed of all accessible portions of each vessel. Bilateral testing is considered an integral part of a complete examination. Limited examinations for reoccurring indications may be performed as noted.  +----------+-------------+----------+-----------+-------+ RIGHT TCD Right VM (cm)Depth (cm)PulsatilityComment +----------+-------------+----------+-----------+-------+ MCA           37.00                 1.15            +----------+-------------+----------+-----------+-------+ ACA          -15.00                 1.03            +----------+-------------+----------+-----------+-------+ Term ICA      37.00                 1.42            +----------+-------------+----------+-----------+-------+ PCA           21.00                 1.08            +----------+-------------+----------+-----------+-------+ Opthalmic     20.00                 1.23            +----------+-------------+----------+-----------+-------+ ICA siphon    29.00                 1.40            +----------+-------------+----------+-----------+-------+ Vertebral    -23.00                 1.02            +----------+-------------+----------+-----------+-------+  +----------+------------+----------+-----------+-------+ LEFT TCD  Left VM (cm)Depth (cm)PulsatilityComment +----------+------------+----------+-----------+-------+ MCA          47.00                  0.93            +----------+------------+----------+-----------+-------+ ACA          -38.00                0.75            +----------+------------+----------+-----------+-------+ Term ICA     37.00                 1.05            +----------+------------+----------+-----------+-------+ PCA          21.00                 1.64            +----------+------------+----------+-----------+-------+ Opthalmic    24.00                 1.12            +----------+------------+----------+-----------+-------+ ICA siphon   54.00                 1.10            +----------+------------+----------+-----------+-------+  Vertebral    -24.00                1.27            +----------+------------+----------+-----------+-------+  +------------+-------+-------+             VM cm/sComment +------------+-------+-------+ Dist Basilar-29.00         +------------+-------+-------+ Summary: This was a normal transcranial Doppler study, with normal flow direction and velocity of all identified vessels of the anterior and posterior circulations, with no evidence of stenosis, vasospasm or occlusion. There was no evidence of intracranial disease. *See table(s) above for TCD measurements and observations.  Diagnosing physician: Antony Contras MD Electronically signed by Antony Contras MD on 01/19/2020 at 4:42:25 PM.    Final    Recent Labs    01/20/20 0244  WBC 5.8  HGB 8.3*  HCT 25.2*  PLT 77*   Recent Labs    01/20/20 0244  NA 136  K 3.8  CL 106  CO2 22  GLUCOSE 77  BUN 18  CREATININE 1.23*  CALCIUM 8.0*    Intake/Output Summary (Last 24 hours) at 01/21/2020 1048 Last data filed at 01/21/2020 0300 Gross per 24 hour  Intake 630.5 ml  Output --  Net 630.5 ml     Physical Exam: Vital Signs Blood pressure 132/82, pulse 97, temperature 98.6 F (37 C), resp. rate (!) 21, height 5\' 6"  (1.676 m), weight 89.5 kg, SpO2 98 %.  General: lying in bed, lethargic but  moving restlessly, eyes closed HEENT: NGT present, mucosa appears moist Neck: Supple without JVD or lymphadenopathy Heart: Reg rate and rhythm. No murmurs rubs or gallops Chest: a few scattered rhonchi Abdomen: Soft, non-tender, non-distended, bowel sounds positive. Extremities: No clubbing, cyanosis, or edema. Pulses are 2+ Skin:  Ecchymoses on bilateral LE's Neuro: lethargic. Does follows 50% of very simple commands. Left sided preference, speech very dysarthric and usually unintelligible. RUE 0/5. Moves LUE 2-3/5 at least and BLE spontaneously L>R. Sensed pain more on left than right. No resting tone.  Musculoskeletal: Full ROM, No pain with AROM or PROM in the neck, trunk, or extremities. Posture appropriate Psych: Pt's affect flat     Assessment/Plan: 1. 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  Physiatrist is providing close team supervision and 24 hour management of active medical problems listed below.  Physiatrist and rehab team continue to assess barriers to discharge/monitor patient progress toward functional and medical goals  Care Tool:  Bathing              Bathing assist       Upper Body Dressing/Undressing Upper body dressing   What is the patient wearing?: Hospital gown only    Upper body assist Assist Level: Dependent - Patient 0%    Lower Body Dressing/Undressing Lower body dressing      What is the patient wearing?: Incontinence brief     Lower body assist Assist for lower body dressing: Dependent - Patient 0%     Toileting Toileting    Toileting assist Assist for toileting: Dependent - Patient 0%     Transfers Chair/bed transfer  Transfers assist           Locomotion Ambulation   Ambulation assist              Walk 10 feet activity   Assist  Walk 50 feet  activity   Assist           Walk 150 feet activity   Assist           Walk 10 feet on uneven surface  activity   Assist           Wheelchair     Assist               Wheelchair 50 feet with 2 turns activity    Assist            Wheelchair 150 feet activity     Assist          Blood pressure 132/82, pulse 97, temperature 98.6 F (37 C), resp. rate (!) 21, height 5\' 6"  (1.676 m), weight 89.5 kg, SpO2 98 %.  Medical Problem List and Plan: 1.  Right hemiparesis, dysphagia, cognitive deficits with disorientation, unintelligible speech lethargy secondary to bilateral intracranial hemorrhages with subsequent infarcts.             -patient may not shower             -ELOS/Goals: 24-28 days/Min A/supervision.             -beginning therapies today, extremely lethargic still but responds to tactile and verbal stim. Has some awareness of surroundings 2.  Antithrombotics: -DVT/anticoagulation:  Pharmaceutical: Heparin--hold                          Dopplers pending to rule out DVT.              -antiplatelet therapy: DAPT 3. Pain Management: Tylenol prn.  4. Mood: LCSW to follow for evaluation and support when appropriate.              -antipsychotic agents: N/A 5. Neuropsych: This patient is not capable of making decisions on her own behalf.  -9/9 begin trial of ritalin for arousal/stimulation 6. Skin/Wound Care: routine pressure relief measures.  7. Fluids/Electrolytes/Nutrition: NPO with Jevity TF- increased to 70 cc/hr so can be held 4 hours for therapy. Added water flushes.   9/9: BUN up to 33 today, increase H2O flushes 8. ABLA: Monitor H/H. Hgb trending down 11.1-->8.3.             hgb miraculously back to 12.5 today 9/9! 9. Thrombocytopenia: Platelets have dropped from 274-->77 . Monitor for signs of bleeding.             9/9 platelets miraculously back to 476k today--f/u next week   -(I question lab error on 9/8 labs) 10.  Post-stroke dysphagia: NPO with tube feeds. Oral care qid. 11. Resting tachycardia: HR around 100 this morning             Monitor with increased exertion 12. Vasospasm prevention: To continue Nimotop for 7 additional days.  13. Bowel incontinence/loose stool: multifactorial, on abx for uti also  -dc ceftriaxone today  -currently with rectal tube for today, dc tomorrow   -stop senna-s, no stool softeners or laxatives  -asked RD to potentially change to more elemental formula  -add fiber and probiotic also 14. Proteus UTI: after discussion with pharmacy and review of chart, ceftriaxone was started 9/3. Will dc today 9/9   LOS: 1 days A FACE TO FACE EVALUATION WAS PERFORMED  Meredith Staggers 01/21/2020, 10:48 AM

## 2020-01-21 NOTE — IPOC Note (Signed)
Overall Plan of Care [facility]) Patient Details Name: Erika Stanton MRN: [id number] DOB: Sep 12, 1955  Admitting Diagnosis: Cerebral aneurysm rupture [facility])  Hospital Problems: Principal Problem:   Cerebral aneurysm rupture [facility])     Functional Problem List: Nursing Behavior, Bladder, Bowel, Edema, Endurance, Medication Management, Nutrition, Pain, Perception, Safety, Skin Integrity, Sensory  PT Balance, Endurance, Motor, Perception, Safety, Skin Integrity, Sensory  OT Balance, Cognition, Endurance, Motor, Nutrition, Perception, Safety, Sensory, Vision  SLP Cognition, Linguistic, Nutrition  TR         Basic ADL's: OT Eating, Grooming, Bathing, Dressing, Toileting     Advanced  ADL's: OT       Transfers: PT Bed Mobility, Bed to Chair, Teacher, early years/pre, Tub/Shower     Locomotion: PT Ambulation, Emergency planning/management officer, Stairs     Additional Impairments: OT Fuctional Use of Upper Extremity  SLP Swallowing, Communication, Social Cognition expression Problem Solving, Memory, Attention, Awareness, Social Interaction  TR      Anticipated Outcomes Item Anticipated Outcome  Self Feeding supervision  Swallowing  Min A   Basic self-care  min a  Toileting  min a   Bathroom Transfers min A  Bowel/Bladder  continent and min assist  Transfers  min assist  Locomotion  min assist  Communication  Min A  Cognition  Min A  Pain  <3 on a 0-10 pain scale  Safety/Judgment  min assist and follow safety plan   Therapy Plan: PT Intensity: Minimum of 1-2 x/day ,45 to 90 minutes PT Frequency: 5 out of 7 days PT Duration Estimated Length of Stay: 4 weeks OT Intensity: Minimum of 1-2 x/day, 45 to 90 minutes OT Frequency: 5 out of 7 days OT Duration/Estimated Length of Stay: 4-5 weeks SLP Intensity: Minumum of 1-2 x/day, 30 to 90 minutes SLP Frequency: 3 to 5 out of 7 days SLP Duration/Estimated Length of Stay: 4 weeks   Due to the current state of emergency, patients may not be  receiving their 3-hours of Medicare-mandated therapy.   Team Interventions: Nursing Interventions Patient/Family Education, Bladder Management, Bowel Management, Disease Management/Prevention, Pain Management, Medication Management, Skin Care/Wound Management, Cognitive Remediation/Compensation, Dysphagia/Aspiration Precaution Training, Discharge Planning, Psychosocial Support  PT interventions Ambulation/gait training, DME/adaptive equipment instruction, Neuromuscular re-education, Stair training, UE/LE Strength taining/ROM, Wheelchair propulsion/positioning, Training and development officer, Discharge planning, Pain management, Skin care/wound management, Therapeutic Activities, UE/LE Coordination activities, Cognitive remediation/compensation, Functional mobility training, Patient/family education, Splinting/orthotics, Therapeutic Exercise, Visual/perceptual remediation/compensation  OT Interventions Balance/vestibular training, Neuromuscular re-education, Self Care/advanced ADL retraining, Therapeutic Exercise, Wheelchair propulsion/positioning, Cognitive remediation/compensation, DME/adaptive equipment instruction, Pain management, Skin care/wound managment, UE/LE Strength taining/ROM, Community reintegration, Barrister's clerk education, Splinting/orthotics, UE/LE Coordination activities, Discharge planning, Functional mobility training, Therapeutic Activities, Visual/perceptual remediation/compensation  SLP Interventions Cognitive remediation/compensation, Dysphagia/aspiration precaution training, Internal/external aids, Speech/Language facilitation, Therapeutic Activities, Environmental controls, Cueing hierarchy, Functional tasks, Patient/family education  TR Interventions    SW/CM Interventions Discharge Planning, Psychosocial Support, Patient/Family Education   Barriers to Discharge MD  Medical stability  Nursing Inaccessible home environment, Decreased caregiver support, Home environment  access/layout, Incontinence, Wound Care, Lack of/limited family support, Medication compliance, Behavior, Nutrition means    PT Inaccessible home environment, Incontinence Pt w/severe functional deficits  OT      SLP      SW Decreased caregiver support, Lack of/limited family support     Team Discharge Planning: Destination: PT-Home ,OT- Home , SLP-Home Projected Follow-up: PT-Home health PT, 24 hour supervision/assistance, OT-  Home health OT, SLP-Home Health SLP, 24 hour supervision/assistance Projected Equipment Needs: PT-To  be determined, OT- 3 in 1 bedside comode, Tub/shower bench, SLP-To be determined Equipment Details: PT-anticipate possible specialized wc needs, OT-  Patient/family involved in discharge planning: PT- Family member/caregiver,  OT-Patient, Family member/caregiver, SLP-Patient unable/family or caregive not available  MD ELOS: 4 weeks Medical Rehab Prognosis:  Good Assessment: The patient has been admitted for CIR therapies with the 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 The team will be addressing functional mobility, strength, stamina, balance, safety, adaptive techniques and equipment, self-care, bowel and bladder mgt, patient and caregiver education, NMR, language, swallowing, arousal, communication.  Goals have been set at min assist for mobility, self-care, and swallowing/communication, speech. .   Due to the current state of emergency, patients may not be receiving their 3 hours per day of Medicare-mandated therapy.    Meredith Staggers, MD, FAAPMR      See Team Conference Notes for weekly updates to the plan of care

## 2020-01-22 ENCOUNTER — Inpatient Hospital Stay (HOSPITAL_COMMUNITY): Payer: BC Managed Care – PPO | Admitting: Physical Therapy

## 2020-01-22 ENCOUNTER — Inpatient Hospital Stay (HOSPITAL_COMMUNITY): Payer: BC Managed Care – PPO | Admitting: Speech Pathology

## 2020-01-22 ENCOUNTER — Inpatient Hospital Stay (HOSPITAL_COMMUNITY): Payer: BC Managed Care – PPO | Admitting: Occupational Therapy

## 2020-01-22 LAB — GLUCOSE, CAPILLARY
Glucose-Capillary: 101 mg/dL — ABNORMAL HIGH (ref 70–99)
Glucose-Capillary: 137 mg/dL — ABNORMAL HIGH (ref 70–99)
Glucose-Capillary: 137 mg/dL — ABNORMAL HIGH (ref 70–99)
Glucose-Capillary: 139 mg/dL — ABNORMAL HIGH (ref 70–99)
Glucose-Capillary: 140 mg/dL — ABNORMAL HIGH (ref 70–99)
Glucose-Capillary: 144 mg/dL — ABNORMAL HIGH (ref 70–99)

## 2020-01-22 LAB — BASIC METABOLIC PANEL
Anion gap: 12 (ref 5–15)
BUN: 33 mg/dL — ABNORMAL HIGH (ref 8–23)
CO2: 24 mmol/L (ref 22–32)
Calcium: 9.2 mg/dL (ref 8.9–10.3)
Chloride: 104 mmol/L (ref 98–111)
Creatinine, Ser: 0.4 mg/dL — ABNORMAL LOW (ref 0.44–1.00)
GFR calc Af Amer: >60 mL/min (ref >60)
GFR calc non Af Amer: >60 mL/min (ref >60)
Glucose, Bld: 109 mg/dL — ABNORMAL HIGH (ref 70–99)
Potassium: 4.3 mmol/L (ref 3.5–5.1)
Sodium: 140 mmol/L (ref 135–145)

## 2020-01-22 MED ORDER — GERHARDT'S BUTT CREAM
TOPICAL_CREAM | CUTANEOUS | Status: DC | PRN
  Filled 2020-01-22: qty 1

## 2020-01-22 NOTE — Progress Notes (Signed)
Occupational Therapy Session Note  Patient Details  Name: Erika Stanton MRN: [id number] Date of Birth: 05/17/1955  Today's Date: 01/22/2020 OT Individual Time: 7829-5621 OT Individual Time Calculation (min): 39 min  and Today's Date: 01/22/2020 OT Missed Time: 21 Minutes Missed Time Reason: Patient unwilling/refused to participate without medical reason;Patient fatigue   Short Term Goals: Week 1:  OT Short Term Goal 1 (Week 1): patient will roll in bed with min A, move from side lying to sitting edge of bed with mod A of one OT Short Term Goal 2 (Week 1): patient will tolerate unsupported sitting with min A for 10 minutes with head midline OT Short Term Goal 3 (Week 1): patient will use left UE to wash 25% of upper body and assist with donning [place] shirt max A OT Short Term Goal 4 (Week 1): patient will scan environment both right and left to locate items with moderate cues  Skilled Therapeutic Interventions/Progress Updates:    Treatment session with focus on trunk control, sitting balance, following 1 step commands, and visual attention to Lt and Rt.  Pt received supine in bed with RN present, administering morning meds.  Engaged in rolling with pt able to bring legs to EOB with min assist, requiring mod-max assist for UB to complete rolling on to side and then transition up to sitting at EOB.  Pt with heavy Lt lean in sitting requiring max assist to facilitate improved sitting balance.  Engaged in PROM to neck to facilitate increased ROM as needed to increase ability to scan Rt and Lt.  Pt initially reporting pain in neck, however able to maintain passive stretch.  Engaged in functional reaching with UB bathing to facilitate both scanning and AAROM in [place].  Engaged in washing face with hand over hand to facilitate increased reach and ROM.  Pt frequently requesting to return to supine throughout activity, able to be redirected for short periods of time.  Returned pt to supine with +2 and continued  to address neck rotation.  Positioned with rolled towel to Lt of head to facilitate midline positioning of neck.  Pt falling asleep in supine, therefore remained semi-reclined in bed with all needs in reach.  Therapy Documentation Precautions:  Precautions Precautions: Fall Precaution Comments: NPO Restrictions Weight Bearing Restrictions: No General: General OT Amount of Missed Time: 21 Minutes Pain: Pt reports pain "all over" with mobility.  Premedicated, returned to supine at end of session.  Therapy/Group: Individual Therapy  Simonne Come 01/22/2020, 10:22 AM

## 2020-01-22 NOTE — Progress Notes (Signed)
Speech Language Pathology Daily Session Note  Patient Details  Name: Erika Stanton MRN: [id number] Date of Birth: May 25, 1955  Today's Date: 01/22/2020 SLP Individual Time: 7209-4709 SLP Individual Time Calculation (min): 55 min  Short Term Goals: Week 1: SLP Short Term Goal 1 (Week 1): Patient will increase speech intelligibility to ~50% at the phrase level with Max A multimodal cues for use of speech intelligibility strategies. SLP Short Term Goal 2 (Week 1): Patient will consume trials of ice chips with minimal overt s/s of aspiration over 2 sessions with Mod A vebral cues to assess readiness for repeat MBS SLP Short Term Goal 3 (Week 1): Patient will attend to a functional task for ~5 minutes with Max A multimodal cues.  Skilled Therapeutic Interventions: Skilled treatment session focused on dysphagia and speech goals. Upon arrival, patient appeared lethargic and required constant repositioning throughout session due to severe neck flexion to the left which impacts her overall function, especially in regards to swallowing. SLP performed oral care via the suction toothbrush and administered trials of ice chips. Patient require Max A visual, verbal and tactile cues to open her oral cavity large enough to except the ice from the spoon. Patient with moderate left anterior spillage but did demonstrate appropriate mastication. Patient initiated a swallow in 100% of trials with only delayed coughing noted X 1. Patient's lethargy and head position continue to be the biggest barrier of her swallowing function. Hopeful for MBS early next week to assess swallow function. Max A verbal cues were also required for use of a slow rate and over-articulation at the phrase level to achieve ~50% intelligibility. Patient left upright in bed with alarm on and all needs within reach. Continue with current plan of care.      Pain No reports of pain, intermittent grimacing when attempting to stretch her neck.    Therapy/Group: Individual Therapy  Winona Sison 01/22/2020, 3:02 PM

## 2020-01-22 NOTE — Progress Notes (Signed)
Physical Therapy Session Note  Patient Details  Name: Erika Stanton MRN: [id number] Date of Birth: 1955-12-24  Today's Date: 01/22/2020 PT Individual Time: 1310-1335 PT Individual Time Calculation (min): 25 min   Short Term Goals: Week 1:  PT Short Term Goal 1 (Week 1): Pt will demonstrate static sit consistently w/min assist PT Short Term Goal 2 (Week 1): pt will perform STS w/mod assist of 2 PT Short Term Goal 3 (Week 1): Pt will ambulate 41ft w/LRAD and max assist of 2 PT Short Term Goal 4 (Week 1): Pt will tolerate passive cervical rotation to R 20* PT Short Term Goal 5 (Week 1): Pt will tolerate 1-2 hrs OOB in wc  Skilled Therapeutic Interventions/Progress Updates:  Skilled PT treatment x 25 minutes total with middle of session interrupted by nursing care to remove rectal tube.  Pt received in bed and agreeable to tx. Provided pt with paper scrub pants cut to be shorts and pt requires multimodal cuing and hand over hand to initiate reaching for bed rail but pt does participate in rolling R with max assist. PT observed BM in brief to began cleaning pt dependently but it appeared that rectal tube may be leaking. Nurse made aware & pt missed time for nursing care to remove tube. PT then re-entered room & pt requires max assist for rolling R, sidelying>sitting but pt attempts to participate in movement. PT places slide board total assist & pt completes slide board bed>TIS w/c with pt initiating/participating in scooting with PT providing total assist for anterior weight shift & head/hips relationship & scooting across board with max assist + 2 for safety. Placed pillow under pt's L side to assist with positioning in w/c. Pt left in TIS w/c with chair alarm & seat belt donned, pancake call bell in lap, husband present in room.  Pt able to verbalize husband's name & another sentence during session.   Therapy Documentation Precautions:  Precautions Precautions: Fall Precaution Comments:  NPO Restrictions Weight Bearing Restrictions: No   General: PT Amount of Missed Time (min): 20 Minutes PT Missed Treatment Reason: Nursing care  Pain: No faces/behaviors demonstrating pain.    Therapy/Group: Individual Therapy  [name] 01/22/2020, 2:47 PM

## 2020-01-22 NOTE — Progress Notes (Signed)
Physical Therapy Session Note  Patient Details  Name: Erika Stanton MRN: [id number] Date of Birth: 04/27/1956  Today's Date: 01/22/2020 PT Individual Time: 1126-1157 PT Individual Time Calculation (min): 31 min   Short Term Goals: Week 1:  PT Short Term Goal 1 (Week 1): Pt will demonstrate static sit consistently w/min assist PT Short Term Goal 2 (Week 1): pt will perform STS w/mod assist of 2 PT Short Term Goal 3 (Week 1): Pt will ambulate 36ft w/LRAD and max assist of 2 PT Short Term Goal 4 (Week 1): Pt will tolerate passive cervical rotation to R 20* PT Short Term Goal 5 (Week 1): Pt will tolerate 1-2 hrs OOB in wc  Skilled Therapeutic Interventions/Progress Updates:    pt received in bed and agreeable to therapy with head nod. Pt sleeping soundly upon arrival to room and required tactile and auditory stimuli to increase alertness. Pt able to mumble yes/no and simple answers to questions during session and follow ismple commands ~75% of time. Pt directed in supine>sit total A with 2 helpers. Once sitting EOB, pt required max A x1 for BLE placement and management, trunk alignment and weight shifting to L for midline positioning. Pt demonstrated pushing posteriorly and laterally, benefited from assistance with stimuli to R side and increased time. Pt demonstrated R torticollis in supine and worse in sitting PT applied towel stretch in sitting for 3x1 mins without over pressure to with pt able to achieve midline and bring eyes ~10 degrees passed midline with command however pt unable to track to R or L. Pt's overall posture improved with extra time at EOB and decreased push and lean, with light towel stretch in sitting pt demonstrated improved posture with trunk extension, midline righting and maintaining with BLE supported and BUE. Pt directed in returning to bed post 20 mins EOB total, max A x2 and total A x2 for midline alignment in bed, upward scooting. Pt left in bed, alarm set, All needs in  reach and in good condition. Call light in lap. All lines as found.   Therapy Documentation Precautions:  Precautions Precautions: Fall Precaution Comments: NPO Restrictions Weight Bearing Restrictions: No    Therapy/Group: Individual Therapy  Junie Panning 01/22/2020, 1:30 PM

## 2020-01-22 NOTE — Progress Notes (Signed)
[facility] PHYSICAL MEDICINE & REHABILITATION PROGRESS NOTE   Subjective/Complaints: No complaints. She is lethargic, able to open eyes and make eye contact.  Gurgling. NGT inplace  ROS: Limited due to cognitive/behavioral    Objective:   VAS [place] LOWER EXTREMITY VENOUS (DVT)  Result Date: 01/21/2020  Lower Venous DVT Study Indications: Immobility.  Risk Factors: None identified. Limitations: Body habitus, poor ultrasound/tissue interface and patient positioning, patient movement. Comparison Study: 01/15/2020 - Negative for left DVT Performing Technologist: Oliver Hum RVT  Examination Guidelines: A complete evaluation includes B-mode imaging, spectral Doppler, color Doppler, and power Doppler as needed of all accessible portions of each vessel. Bilateral testing is considered an integral part of a complete examination. Limited examinations for reoccurring indications may be performed as noted. The reflux portion of the exam is performed with the patient in reverse Trendelenburg.  +---------+---------------+---------+-----------+----------+--------------+ RIGHT    CompressibilityPhasicitySpontaneityPropertiesThrombus Aging +---------+---------------+---------+-----------+----------+--------------+ CFV      Full           Yes      Yes                                 +---------+---------------+---------+-----------+----------+--------------+ SFJ      Full                                                        +---------+---------------+---------+-----------+----------+--------------+ FV Prox  Full                                                        +---------+---------------+---------+-----------+----------+--------------+ FV Mid   Full                                                        +---------+---------------+---------+-----------+----------+--------------+ FV Distal               Yes      Yes                                  +---------+---------------+---------+-----------+----------+--------------+ PFV      Full                                                        +---------+---------------+---------+-----------+----------+--------------+ POP      Full           Yes      Yes                                 +---------+---------------+---------+-----------+----------+--------------+ PTV      Full                                                        +---------+---------------+---------+-----------+----------+--------------+  PERO     Full                                                        +---------+---------------+---------+-----------+----------+--------------+   +---------+---------------+---------+-----------+----------+--------------+ LEFT     CompressibilityPhasicitySpontaneityPropertiesThrombus Aging +---------+---------------+---------+-----------+----------+--------------+ CFV      Full           Yes      Yes                                 +---------+---------------+---------+-----------+----------+--------------+ SFJ      Full                                                        +---------+---------------+---------+-----------+----------+--------------+ FV Prox  Full                                                        +---------+---------------+---------+-----------+----------+--------------+ FV Mid   Full                                                        +---------+---------------+---------+-----------+----------+--------------+ FV DistalFull                                                        +---------+---------------+---------+-----------+----------+--------------+ PFV      Full                                                        +---------+---------------+---------+-----------+----------+--------------+ POP      Full           Yes      Yes                                  +---------+---------------+---------+-----------+----------+--------------+ PTV      Full                                                        +---------+---------------+---------+-----------+----------+--------------+ PERO     Full                                                        +---------+---------------+---------+-----------+----------+--------------+  Summary: RIGHT: - There is no evidence of deep vein thrombosis in the lower extremity. However, portions of this examination were limited- see technologist comments above.  - No cystic structure found in the popliteal fossa.  LEFT: - There is no evidence of deep vein thrombosis in the lower extremity. However, portions of this examination were limited- see technologist comments above.  - No cystic structure found in the popliteal fossa.  *See table(s) above for measurements and observations. Electronically signed by Servando Snare MD on 01/21/2020 at 2:08:10 PM.    Final    Recent Labs    01/20/20 0244 01/21/20 1009  WBC 5.8 9.7  HGB 8.3* 12.5  HCT 25.2* 39.8  PLT 77* 476*   Recent Labs    01/20/20 0244 01/21/20 1009  NA 136 142  K 3.8 4.2  CL 106 108  CO2 22 23  GLUCOSE 77 139*  BUN 18 33*  CREATININE 1.23* 0.51  CALCIUM 8.0* 9.6    Intake/Output Summary (Last 24 hours) at 01/22/2020 1031 Last data filed at 01/22/2020 0010 Gross per 24 hour  Intake --  Output 450 ml  Net -450 ml     Physical Exam: Vital Signs Blood pressure 106/79, pulse 95, temperature 98.6 F (37 C), resp. rate 20, height 5\' 6"  (1.676 m), weight 89.5 kg, SpO2 99 %. General: lying in bed, lethargic but moving restlessly, eyes closed HEENT: NGT present, mucosa appears moist Neck: Supple without JVD or lymphadenopathy Heart: Reg rate and rhythm. No murmurs rubs or gallops Chest: a few scattered rhonchi Abdomen: Soft, non-tender, non-distended, bowel sounds positive. Extremities: No clubbing, cyanosis, or edema. Pulses are 2+ Skin:   Ecchymoses on bilateral LE's Neuro: lethargic. Does follows 50% of very simple commands. Left sided preference, speech very dysarthric and usually unintelligible. RUE 0/5. Moves LUE 2-3/5 at least and BLE spontaneously L>R. Sensed pain more on left than right. No resting tone.  Musculoskeletal: Full ROM, No pain with AROM or PROM in the neck, trunk, or extremities. Posture appropriate Psych: Pt's affect flat   Assessment/Plan: 1. 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  Physiatrist is providing close team supervision and 24 hour management of active medical problems listed below.  Physiatrist and rehab team continue to assess barriers to discharge/monitor patient progress toward functional and medical goals  Care Tool:  Bathing        Body parts bathed by helper: Right arm, Left arm, Chest, Abdomen, Front perineal area, Buttocks, Right upper leg, Left upper leg, Right lower leg, Left lower leg, Face     Bathing assist Assist Level: Dependent - Patient 0%     Upper Body Dressing/Undressing Upper body dressing   What is the patient wearing?: Pull over shirt    Upper body assist Assist Level: Dependent - Patient 0%    Lower Body Dressing/Undressing Lower body dressing      What is the patient wearing?: Incontinence brief     Lower body assist Assist for lower body dressing: Dependent - Patient 0%     Toileting Toileting    Toileting assist Assist for toileting: Dependent - Patient 0%     Transfers Chair/bed transfer  Transfers assist     Chair/bed transfer assist level: 2 Helpers     Locomotion Ambulation   Ambulation assist      Assist level: 2 helpers Assistive device: Other (comment) (swedish walker) Max distance:  10   Walk 10 feet activity   Assist     Assist level: 2 helpers Assistive device: Other  (comment) (swedish walker)   Walk 50 feet activity   Assist Walk 50 feet with 2 turns activity did not occur: Safety/medical concerns         Walk 150 feet activity   Assist Walk 150 feet activity did not occur: Safety/medical concerns         Walk 10 feet on uneven surface  activity   Assist Walk 10 feet on uneven surfaces activity did not occur: Safety/medical concerns         Wheelchair     Assist Will patient use wheelchair at discharge?: Yes Type of Wheelchair: Manual           Wheelchair 50 feet with 2 turns activity    Assist    Wheelchair 50 feet with 2 turns activity did not occur: Safety/medical concerns (requires TIS for postural support)       Wheelchair 150 feet activity     Assist  Wheelchair 150 feet activity did not occur: Safety/medical concerns       Blood pressure 106/79, pulse 95, temperature 98.6 F (37 C), resp. rate 20, height 5\' 6"  (1.676 m), weight 89.5 kg, SpO2 99 %.  Medical Problem List and Plan: 1.  Right hemiparesis, dysphagia, cognitive deficits with disorientation, unintelligible speech lethargy secondary to bilateral intracranial hemorrhages with subsequent infarcts.             -patient may not shower             -ELOS/Goals: 24-28 days/Min A/supervision.             -Continue CIR, extremely lethargic still but responds to tactile and verbal stim. Has some awareness of surroundings 2.  Antithrombotics: -DVT/anticoagulation:  Pharmaceutical: Heparin--hold                          Dopplers pending to rule out DVT.              -antiplatelet therapy: DAPT 3. Pain Management: Tylenol prn. Appears to be well controlled.  4. Mood: LCSW to follow for evaluation and support when appropriate.              -antipsychotic agents: N/A 5. Neuropsych: This patient is not capable of making decisions on her own behalf.  -9/9 begin trial of ritalin for arousal/stimulation  9/10: can make eye contact.  6. Skin/Wound  Care: routine pressure relief measures.  7. Fluids/Electrolytes/Nutrition: NPO with Jevity TF- increased to 70 cc/hr so can be held 4 hours for therapy. Added water flushes.   9/9: BUN up to 33 today, increase H2O flushes 8. ABLA: Monitor H/H. Hgb trending down 11.1-->8.3.             hgb miraculously back to 12.5 9/9! 9. Thrombocytopenia: Platelets have dropped from 274-->77 . Monitor for signs of bleeding.             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 Oral care qid. 11. Resting tachycardia: better controlled 9/10.             Monitor with increased exertion 12. Vasospasm prevention: To continue Nimotop for 7 additional days.  13. Bowel incontinence/loose stool: multifactorial, on abx for uti also  -dc ceftriaxone  -currently with rectal  tube, dc 9/10   -stop senna-s, no stool softeners or laxatives  -asked RD to potentially change to more elemental formula  -add fiber and probiotic also 14. Proteus UTI: after discussion with pharmacy and review of chart, ceftriaxone was started 9/3. Dc'ed 9/9 15. General: Will call husband 9/10 to update him   LOS: 2 days A FACE TO FACE EVALUATION WAS PERFORMED  Martha Clan P Abigail Teall 01/22/2020, 10:31 AM

## 2020-01-22 NOTE — Plan of Care (Signed)
  Problem: Consults Goal: RH BRAIN INJURY PATIENT EDUCATION Description: Description: See Patient Education module for eduction specifics Outcome: Progressing Goal: Skin Care Protocol Initiated - if Braden Score 18 or less Description: If consults are not indicated, leave blank or document N/A Outcome: Progressing Goal: Nutrition Consult-if indicated Outcome: Progressing Goal: Diabetes Guidelines if Diabetic/Glucose > 140 Description: If diabetic or lab glucose is > 140 mg/dl - Initiate Diabetes/Hyperglycemia Guidelines & Document Interventions  Outcome: Progressing   Problem: RH BOWEL ELIMINATION Goal: RH STG MANAGE BOWEL WITH ASSISTANCE Description: STG Manage Bowel with min Assistance. Outcome: Progressing Goal: RH STG MANAGE BOWEL W/MEDICATION W/ASSISTANCE Description: STG Manage Bowel with Medication with min Assistance. Outcome: Progressing   Problem: RH BLADDER ELIMINATION Goal: RH STG MANAGE BLADDER WITH ASSISTANCE Description: STG Manage Bladder With min Assistance Outcome: Progressing Goal: RH STG MANAGE BLADDER WITH MEDICATION WITH ASSISTANCE Description: STG Manage Bladder With Medication With min Assistance. Outcome: Progressing   Problem: RH SKIN INTEGRITY Goal: RH STG SKIN FREE OF INFECTION/BREAKDOWN Description: Skin to remain free from infection and breakdown with min assist while on CIR. Outcome: Progressing Goal: RH STG MAINTAIN SKIN INTEGRITY WITH ASSISTANCE Description: STG Maintain Skin Integrity With min Assistance. Outcome: Progressing Goal: RH STG ABLE TO PERFORM INCISION/WOUND CARE W/ASSISTANCE Description: STG Able To Perform Incision/Wound Care With min Assistance. Outcome: Progressing   Problem: RH SAFETY Goal: RH STG ADHERE TO SAFETY PRECAUTIONS W/ASSISTANCE/DEVICE Description: STG Adhere to Safety Precautions With min Assistance and appropriate assistive Device. Outcome: Progressing   Problem: RH COGNITION-NURSING Goal: RH STG USES MEMORY  AIDS/STRATEGIES W/ASSIST TO PROBLEM SOLVE Description: STG Uses Memory Aids/Strategies With min Assistance to Problem Solve. Outcome: Progressing Goal: RH STG ANTICIPATES NEEDS/CALLS FOR ASSIST W/ASSIST/CUES Description: STG Anticipates Needs/Calls for Assist With min Assistance/Cues. Outcome: Progressing   Problem: RH PAIN MANAGEMENT Goal: RH STG PAIN MANAGED AT OR BELOW PT'S PAIN GOAL Description: <3 on a 0-10 pain scale. Outcome: Progressing   Problem: RH KNOWLEDGE DEFICIT BRAIN INJURY Goal: RH STG INCREASE KNOWLEDGE OF SELF CARE AFTER BRAIN INJURY Description: Patient will demonstrate knowledge of medication management, diabetes management, dietary management, and follow up care with the MD post discharge with min assist from [facility] staff. Outcome: Progressing Goal: RH STG INCREASE KNOWLEDGE OF DYSPHAGIA/FLUID INTAKE Description: Patient will be able to demonstrate knowledge of dysphagia diets, thickened liquids, and swallowing precautions with min assist from rehab staff. Outcome: Progressing   Problem: RH Vision Goal: RH LTG Vision (Specify) Outcome: Progressing

## 2020-01-23 DIAGNOSIS — R7303 Prediabetes: Secondary | ICD-10-CM

## 2020-01-23 DIAGNOSIS — D62 Acute posthemorrhagic anemia: Secondary | ICD-10-CM

## 2020-01-23 DIAGNOSIS — R159 Full incontinence of feces: Secondary | ICD-10-CM

## 2020-01-23 DIAGNOSIS — R799 Abnormal finding of blood chemistry, unspecified: Secondary | ICD-10-CM

## 2020-01-23 LAB — GLUCOSE, CAPILLARY
Glucose-Capillary: 120 mg/dL — ABNORMAL HIGH (ref 70–99)
Glucose-Capillary: 121 mg/dL — ABNORMAL HIGH (ref 70–99)
Glucose-Capillary: 124 mg/dL — ABNORMAL HIGH (ref 70–99)
Glucose-Capillary: 125 mg/dL — ABNORMAL HIGH (ref 70–99)
Glucose-Capillary: 127 mg/dL — ABNORMAL HIGH (ref 70–99)
Glucose-Capillary: 99 mg/dL (ref 70–99)

## 2020-01-23 NOTE — Plan of Care (Signed)
  Problem: Consults Goal: RH BRAIN INJURY PATIENT EDUCATION Description: Description: See Patient Education module for eduction specifics Outcome: Progressing Goal: Skin Care Protocol Initiated - if Braden Score 18 or less Description: If consults are not indicated, leave blank or document N/A Outcome: Progressing Goal: Nutrition Consult-if indicated Outcome: Progressing Goal: Diabetes Guidelines if Diabetic/Glucose > 140 Description: If diabetic or lab glucose is > 140 mg/dl - Initiate Diabetes/Hyperglycemia Guidelines & Document Interventions  Outcome: Progressing   Problem: RH BOWEL ELIMINATION Goal: RH STG MANAGE BOWEL WITH ASSISTANCE Description: STG Manage Bowel with min Assistance. Outcome: Progressing Goal: RH STG MANAGE BOWEL W/MEDICATION W/ASSISTANCE Description: STG Manage Bowel with Medication with min Assistance. Outcome: Progressing   Problem: RH BLADDER ELIMINATION Goal: RH STG MANAGE BLADDER WITH ASSISTANCE Description: STG Manage Bladder With min Assistance Outcome: Progressing Goal: RH STG MANAGE BLADDER WITH MEDICATION WITH ASSISTANCE Description: STG Manage Bladder With Medication With min Assistance. Outcome: Progressing   Problem: RH SKIN INTEGRITY Goal: RH STG SKIN FREE OF INFECTION/BREAKDOWN Description: Skin to remain free from infection and breakdown with min assist while on CIR. Outcome: Progressing Goal: RH STG MAINTAIN SKIN INTEGRITY WITH ASSISTANCE Description: STG Maintain Skin Integrity With min Assistance. Outcome: Progressing Goal: RH STG ABLE TO PERFORM INCISION/WOUND CARE W/ASSISTANCE Description: STG Able To Perform Incision/Wound Care With min Assistance. Outcome: Progressing   Problem: RH SAFETY Goal: RH STG ADHERE TO SAFETY PRECAUTIONS W/ASSISTANCE/DEVICE Description: STG Adhere to Safety Precautions With min Assistance and appropriate assistive Device. Outcome: Progressing   Problem: RH COGNITION-NURSING Goal: RH STG USES MEMORY  AIDS/STRATEGIES W/ASSIST TO PROBLEM SOLVE Description: STG Uses Memory Aids/Strategies With min Assistance to Problem Solve. Outcome: Progressing Goal: RH STG ANTICIPATES NEEDS/CALLS FOR ASSIST W/ASSIST/CUES Description: STG Anticipates Needs/Calls for Assist With min Assistance/Cues. Outcome: Progressing   Problem: RH PAIN MANAGEMENT Goal: RH STG PAIN MANAGED AT OR BELOW PT'S PAIN GOAL Description: <3 on a 0-10 pain scale. Outcome: Progressing   Problem: RH KNOWLEDGE DEFICIT BRAIN INJURY Goal: RH STG INCREASE KNOWLEDGE OF SELF CARE AFTER BRAIN INJURY Description: Patient will demonstrate knowledge of medication management, diabetes management, dietary management, and follow up care with the MD post discharge with min assist from [facility] staff. Outcome: Progressing Goal: RH STG INCREASE KNOWLEDGE OF DYSPHAGIA/FLUID INTAKE Description: Patient will be able to demonstrate knowledge of dysphagia diets, thickened liquids, and swallowing precautions with min assist from rehab staff. Outcome: Progressing   Problem: RH Vision Goal: RH LTG Vision (Specify) Outcome: Progressing

## 2020-01-23 NOTE — Progress Notes (Signed)
[facility] PHYSICAL MEDICINE & REHABILITATION PROGRESS NOTE   Subjective/Complaints: Patient seen sitting up in bed this morning.  No reported issues overnight.  She repeatedly states "ice chips".  ROS: Limited due to cognition  Objective:   VAS [place] LOWER EXTREMITY VENOUS (DVT)  Result Date: 01/21/2020  Lower Venous DVT Study Indications: Immobility.  Risk Factors: None identified. Limitations: Body habitus, poor ultrasound/tissue interface and patient positioning, patient movement. Comparison Study: 01/15/2020 - Negative for left DVT Performing Technologist: Oliver Hum RVT  Examination Guidelines: A complete evaluation includes B-mode imaging, spectral Doppler, color Doppler, and power Doppler as needed of all accessible portions of each vessel. Bilateral testing is considered an integral part of a complete examination. Limited examinations for reoccurring indications may be performed as noted. The reflux portion of the exam is performed with the patient in reverse Trendelenburg.  +---------+---------------+---------+-----------+----------+--------------+ RIGHT    CompressibilityPhasicitySpontaneityPropertiesThrombus Aging +---------+---------------+---------+-----------+----------+--------------+ CFV      Full           Yes      Yes                                 +---------+---------------+---------+-----------+----------+--------------+ SFJ      Full                                                        +---------+---------------+---------+-----------+----------+--------------+ FV Prox  Full                                                        +---------+---------------+---------+-----------+----------+--------------+ FV Mid   Full                                                        +---------+---------------+---------+-----------+----------+--------------+ FV Distal               Yes      Yes                                  +---------+---------------+---------+-----------+----------+--------------+ PFV      Full                                                        +---------+---------------+---------+-----------+----------+--------------+ POP      Full           Yes      Yes                                 +---------+---------------+---------+-----------+----------+--------------+ PTV      Full                                                        +---------+---------------+---------+-----------+----------+--------------+  PERO     Full                                                        +---------+---------------+---------+-----------+----------+--------------+   +---------+---------------+---------+-----------+----------+--------------+ LEFT     CompressibilityPhasicitySpontaneityPropertiesThrombus Aging +---------+---------------+---------+-----------+----------+--------------+ CFV      Full           Yes      Yes                                 +---------+---------------+---------+-----------+----------+--------------+ SFJ      Full                                                        +---------+---------------+---------+-----------+----------+--------------+ FV Prox  Full                                                        +---------+---------------+---------+-----------+----------+--------------+ FV Mid   Full                                                        +---------+---------------+---------+-----------+----------+--------------+ FV DistalFull                                                        +---------+---------------+---------+-----------+----------+--------------+ PFV      Full                                                        +---------+---------------+---------+-----------+----------+--------------+ POP      Full           Yes      Yes                                  +---------+---------------+---------+-----------+----------+--------------+ PTV      Full                                                        +---------+---------------+---------+-----------+----------+--------------+ PERO     Full                                                        +---------+---------------+---------+-----------+----------+--------------+  Summary: RIGHT: - There is no evidence of deep vein thrombosis in the lower extremity. However, portions of this examination were limited- see technologist comments above.  - No cystic structure found in the popliteal fossa.  LEFT: - There is no evidence of deep vein thrombosis in the lower extremity. However, portions of this examination were limited- see technologist comments above.  - No cystic structure found in the popliteal fossa.  *See table(s) above for measurements and observations. Electronically signed by Servando Snare MD on 01/21/2020 at 2:08:10 PM.    Final    Recent Labs    01/21/20 1009  WBC 9.7  HGB 12.5  HCT 39.8  PLT 476*   Recent Labs    01/21/20 1009 01/22/20 1022  NA 142 140  K 4.2 4.3  CL 108 104  CO2 23 24  GLUCOSE 139* 109*  BUN 33* 33*  CREATININE 0.51 0.40*  CALCIUM 9.6 9.2    Intake/Output Summary (Last 24 hours) at 01/23/2020 1018 Last data filed at 01/23/2020 0000 Gross per 24 hour  Intake 288 ml  Output 1150 ml  Net -862 ml     Physical Exam: Vital Signs Blood pressure (!) 142/75, pulse 91, temperature 98.5 F (36.9 C), temperature source Oral, resp. rate 20, height 5\' 6"  (1.676 m), weight 89.5 kg, SpO2 98 %. Constitutional: No distress . Vital signs reviewed. HENT: Normocephalic.  Atraumatic. Eyes: EOMI. No discharge. Cardiovascular: No JVD.  RRR. Respiratory: Normal effort.  No stridor.  Bilateral clear to auscultation. GI: Non-distended.  BS +. Skin: Warm and dry.  Intact.  Lower extremity ecchymosis Psych: Normal mood.  Normal behavior. Musc: Perseverative  speech.. Neuro: Somewhat lethargic Dysarthria Motor: Limited due to participation, no movement noted in right upper extremity  Assessment/Plan: 1. 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  Physiatrist is providing close team supervision and 24 hour management of active medical problems listed below.  Physiatrist and rehab team continue to assess barriers to discharge/monitor patient progress toward functional and medical goals  Care Tool:  Bathing        Body parts bathed by helper: Right arm, Left arm, Chest, Abdomen, Front perineal area, Buttocks, Right upper leg, Left upper leg, Right lower leg, Left lower leg, Face     Bathing assist Assist Level: Dependent - Patient 0%     Upper Body Dressing/Undressing Upper body dressing   What is the patient wearing?: Pull over shirt    Upper body assist Assist Level: Dependent - Patient 0%    Lower Body Dressing/Undressing Lower body dressing      What is the patient wearing?: Incontinence brief     Lower body assist Assist for lower body dressing: Dependent - Patient 0%     Toileting Toileting    Toileting assist Assist for toileting: Dependent - Patient 0%     Transfers Chair/bed transfer  Transfers assist     Chair/bed transfer assist level: Dependent - mechanical lift     Locomotion Ambulation   Ambulation assist      Assist level: 2 helpers Assistive device: Other (comment) (swedish walker) Max distance: 10   Walk 10 feet activity   Assist     Assist level: 2 helpers Assistive device: Other (comment) (swedish walker)   Walk 50 feet activity   Assist Walk 50 feet with 2 turns activity did not occur: Safety/medical concerns  Walk 150 feet activity   Assist Walk 150 feet activity did not occur: Safety/medical concerns         Walk 10 feet  on uneven surface  activity   Assist Walk 10 feet on uneven surfaces activity did not occur: Safety/medical concerns         Wheelchair     Assist Will patient use wheelchair at discharge?: Yes Type of Wheelchair: Manual           Wheelchair 50 feet with 2 turns activity    Assist    Wheelchair 50 feet with 2 turns activity did not occur: Safety/medical concerns (requires TIS for postural support)       Wheelchair 150 feet activity     Assist  Wheelchair 150 feet activity did not occur: Safety/medical concerns       Blood pressure (!) 142/75, pulse 91, temperature 98.5 F (36.9 C), temperature source Oral, resp. rate 20, height 5\' 6"  (1.676 m), weight 89.5 kg, SpO2 98 %.  Medical Problem List and Plan: 1.  Right hemiparesis, dysphagia, cognitive deficits with disorientation, unintelligible speech lethargy secondary to bilateral intracranial hemorrhages with subsequent infarcts.  Continue CIR 2.  Antithrombotics: -DVT/anticoagulation:  Pharmaceutical: Heparin--hold                          Dopplers negative for DVT.              -antiplatelet therapy: DAPT 3. Pain Management: Tylenol prn.  Appears to be well controlled.  4. Mood: LCSW to follow for evaluation and support when appropriate.              -antipsychotic agents: N/A 5. Neuropsych: This patient is not capable of making decisions on her own behalf.  Continue trial of ritalin for arousal/stimulation 6. Skin/Wound Care: routine pressure relief measures.  7. Fluids/Electrolytes/Nutrition: NPO with Jevity TF- increased to 70 cc/hr so can be held 4 hours for therapy. Added water flushes.   Increased H2O flushes 8. ABLA: Monitor H/H.   Hemoglobin 12.5 on 9/9,?  Reliability, labs ordered for Monday 9. Thrombocytopenia:   Monitor for signs of bleeding.  Platelets 476 on 9/9,?  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 Oral care qid. 11. Resting  tachycardia:              Monitor with increased exertion  Improved on 9/11 12. Vasospasm prevention: To continue Nimotop for 4 additional days.  13. Bowel incontinence/loose stool: multifactorial, on abx for uti also  -dced ceftriaxone  -rectal tube dced 9/10   -stop senna-s, no stool softeners or laxatives  -asked RD to potentially change to more elemental formula  -added fiber and probiotic also 14. Proteus UTI: Ceftriaxone course completed 18.  Prediabetes, confounded by tube feeds  Continue to monitor 19.  Elevated BUN  Increased water flushes, labs ordered for Monday  LOS: 3 days A FACE TO FACE EVALUATION WAS PERFORMED  Won Kreuzer Lorie Phenix 01/23/2020, 10:18 AM

## 2020-01-24 ENCOUNTER — Inpatient Hospital Stay (HOSPITAL_COMMUNITY): Payer: BC Managed Care – PPO | Admitting: Occupational Therapy

## 2020-01-24 ENCOUNTER — Inpatient Hospital Stay (HOSPITAL_COMMUNITY): Payer: BC Managed Care – PPO

## 2020-01-24 ENCOUNTER — Inpatient Hospital Stay (HOSPITAL_COMMUNITY): Payer: BC Managed Care – PPO | Admitting: Speech Pathology

## 2020-01-24 LAB — GLUCOSE, CAPILLARY
Glucose-Capillary: 104 mg/dL — ABNORMAL HIGH (ref 70–99)
Glucose-Capillary: 119 mg/dL — ABNORMAL HIGH (ref 70–99)
Glucose-Capillary: 124 mg/dL — ABNORMAL HIGH (ref 70–99)
Glucose-Capillary: 127 mg/dL — ABNORMAL HIGH (ref 70–99)
Glucose-Capillary: 128 mg/dL — ABNORMAL HIGH (ref 70–99)

## 2020-01-24 NOTE — Progress Notes (Signed)
Speech Language Pathology Daily Session Note  Patient Details  Name: Erika Stanton MRN: [id number] Date of Birth: March 12, 1956  Today's Date: 01/24/2020 SLP Individual Time: 6759-1638 SLP Individual Time Calculation (min): 41 min  Short Term Goals: Week 1: SLP Short Term Goal 1 (Week 1): Patient will increase speech intelligibility to ~50% at the phrase level with Max A multimodal cues for use of speech intelligibility strategies. SLP Short Term Goal 2 (Week 1): Patient will consume trials of ice chips with minimal overt s/s of aspiration over 2 sessions with Mod A vebral cues to assess readiness for repeat MBS SLP Short Term Goal 3 (Week 1): Patient will attend to a functional task for ~5 minutes with Max A multimodal cues.  Skilled Therapeutic Interventions: Pt was seen for skilled ST targeting dysphagia and cognitive-linguistic goals. Pt remained awake for entirety of session, however required much encouragement to participate in PO trials, as she reported "it's scary" but unable to elaborate more. She was oriented X4, but Max A multimodal cues required for sustained attention to functional tasks for 1-2 minute intervals and to reduce verbosity during PO intake. Pt required hand over hand assist to complete oral care via suction toothbrush in addition to constant physical and verbal assist to correct severe left lean and attend to right side of visual field. Pt accepted ~10 ice chips and 1 tsp thin H2O with 1 immediate throat clear and 1 instance of delayed coughing noted (after ice). Multiple swallows also noted throughout, and although mastication of ice was efficient, attention deficits intermittently lead to decreased bolus awareness, requiring Moderate verbal cues from SLP. Alertness appears to be improving, however due to cognition would recommend continue at least 1-2 additional PO trials at bedside prior to MBSS. Continue NPO for now. Pt's speech also remains highly unintelligible (~25-30% at  word and phrase level); Max A verbal cues provided for pt to use slower rate of speech and increased vocal intensity.  Pt left laying in bed with alarm set and needs within reach. Continue per current plan of care.          Pain Pain Assessment Pain Scale: Faces Faces Pain Scale: Hurts a little bit Pain Type: Acute pain Pain Location: Head Pain Descriptors / Indicators: Headache Pain Onset: Gradual Pain Intervention(s): RN made aware Multiple Pain Sites: No  Therapy/Group: Individual Therapy  Arbutus Leas 01/24/2020, 8:15 AM

## 2020-01-24 NOTE — Progress Notes (Signed)
Occupational Therapy Session Note  Patient Details  Name: Erika Stanton MRN: [id number] Date of Birth: [birth year]  Today's Date: 01/24/2020 OT Individual Time: 1003-1058 OT Individual Time Calculation (min): 55 min   Short Term Goals: Week 1:  OT Short Term Goal 1 (Week 1): patient will roll in bed with min A, move from side lying to sitting edge of bed with mod A of one OT Short Term Goal 2 (Week 1): patient will tolerate unsupported sitting with min A for 10 minutes with head midline OT Short Term Goal 3 (Week 1): patient will use left UE to wash 25% of upper body and assist with donning [place] shirt max A OT Short Term Goal 4 (Week 1): patient will scan environment both right and left to locate items with moderate cues  Skilled Therapeutic Interventions/Progress Updates:    Pt greeted in bed, nursing staff present as pt had a large BM that leaked out onto entire bed. They were finishing up with bed change, requesting OT to assist with bathing/dresing tasks. 2 assist for hygiene completion at bedlevel with pt having redness in periareas, barrier cream applied for skin integrity. Pt required Max A for rolling Rt>Lt, worked on simple command following during bed mobility in regards to turning body/hand placement. Pt able to adjust her hips and raise each LE against gravity during bathing/dressing tasks. Placed her shirt at midline to promote scanning, pt able to make eye contact with therapist Rt of midline but could not get her to attend to items on the Rt side (I.e. shirt, lotion, arm). HOH for handwashing and lotion appliation. +2 assist for boosting/repositioning in bed x2 due to pts tendency to slide down in bed and lean towards the Lt laterally. At end of session pt remained resting in bed with soft call bell on lap and bed alarm set. Tx focus placed on visual scanning, ADL retraining, and praxis.    Therapy Documentation Precautions:  Precautions Precautions: Fall Precaution Comments:  NPO Restrictions Weight Bearing Restrictions: Yes Vital Signs:  Pain: pt premedicated with tylenol per RN, pt with c/o pain during movement at times throughout session, addressed therapeutically with repositioning strategies    ADL: ADL Eating: NPO Grooming: Dependent Where Assessed-Grooming: Bed level Upper Body Bathing: Dependent Where Assessed-Upper Body Bathing: Bed level Lower Body Bathing: Dependent Where Assessed-Lower Body Bathing: Bed level Upper Body Dressing: Dependent Where Assessed-Upper Body Dressing: Edge of bed Lower Body Dressing: Maximal assistance, Dependent Where Assessed-Lower Body Dressing: Bed level Toileting: Dependent Where Assessed-Toileting: Bed level ADL Comments: patient able to hold wash cloth with left hand but unable to manipulate for washing, she is able to lift each leg for donning pants/socks     Therapy/Group: Individual Therapy  Chele Cornell A Nadira Single 01/24/2020, 12:43 PM

## 2020-01-24 NOTE — Progress Notes (Signed)
Physical Therapy Session Note  Patient Details  Name: Erika Stanton MRN: [id number] Date of Birth: Oct 09, 1955  Today's Date: 01/24/2020 PT Individual Time: 0347-4259 PT Individual Time Calculation (min): 54 min  and Today's Date: 01/24/2020 PT Missed Time: 21 Minutes Missed Time Reason: Patient fatigue  Short Term Goals: Week 1:  PT Short Term Goal 1 (Week 1): Pt will demonstrate static sit consistently w/min assist PT Short Term Goal 2 (Week 1): pt will perform STS w/mod assist of 2 PT Short Term Goal 3 (Week 1): Pt will ambulate 73ft w/LRAD and max assist of 2 PT Short Term Goal 4 (Week 1): Pt will tolerate passive cervical rotation to R 20* PT Short Term Goal 5 (Week 1): Pt will tolerate 1-2 hrs OOB in wc  Skilled Therapeutic Interventions/Progress Updates:    Received pt supine in bed with brother present at bedside, pt denied any pain during session, and able to answer yes/no questions ~35% of the time. +2 available for only 15 minutes of session. Session with emphasis on bed mobility, dressing, functional mobility/transfers, R attention, visual scanning, NMR, generalized strengthening, dynamic sitting balance/coordination, and improved activity tolerance. Checked to ensure pt's brief was clean prior to starting therapy and pt rolled to L and R max A and noted pt with clean brief. Pt rolled L and R with max A and required total A to don pants and non-skid socks. Pt transferred supine<>sitting EOB max A +2. Worked on dynamic sitting balance ~4 minutes with mod A for sitting balance; noted pt with L lateral and posterior lean with gaze preference towards L. While sitting EOB worked on midline orientation, upright posture, R cervical rotation, and reciprocal scooting to get feet on floor. Therapist encouraged OOB mobility to TIS [facility] however pt continuously refused stating "I don't want to" but unable to state why. Therapist educated pt on benefits of OOB mobility and encouraged pt to participate.  Pt ultimately returned to supine with total A +2 for trunk control and midline orientation and required +2 assist to scoot to [facility] however pt able to position LEs in hooklying with cues. Pt rolled to L and R with max A and required +2 assist to position Maximove sling. RN present to administer medication and stated pt with frequent loose bowels today which could be contributing to pt declining OOB mobility. Pt was agreeable to bed level therapy and rolled L and R wih max A and doffed Maximove sling. Pt performed the following exercises supine in bed with supervision and verbal/tactile cues for technique: -SLR x10 bilaterally -Hip abduction x10 bilaterally (AAROM on LLE) -PF x8 on L LE and x10 on R LE -hip adduction pillow squeezes x10 Pt with L torticollis and therapist placed towel roll on L side of neck to facilitate midline orientation of head. Worked on dynamic reaching, UE ROM, and R attention reaching for horseshoes with L UE, grasping them, and handing them back to therapist. Pt able to successfully grasp 1/7 horseshoes with max cues for R attention/arousal. Pt frequently falling asleep throughout session and required max cues for attention/arousal. Towards end of session pt with increased fatigue limiting participation in therapy. Concluded session with pt supine in bed, needs within reach, bed alarm on, and soft call bell in reach. Therapist assisted with positioning neck in midline, pillow supporting bilateral UEs, and pillow underneath knees to prevent pt from sliding down in bed. 21 minutes missed of skilled physical therapy due to pt fatigue.   Therapy Documentation Precautions:  Precautions Precautions: Fall Precaution Comments: NPO Restrictions Weight Bearing Restrictions: No  Therapy/Group: Individual Therapy Alfonse Alpers PT, DPT   01/24/2020, 7:35 AM

## 2020-01-24 NOTE — Progress Notes (Signed)
[place] PHYSICAL MEDICINE & REHABILITATION PROGRESS NOTE   Subjective/Complaints: Patient seen laying in bed this morning.  She states she slept well overnight.  She denies complaints.  ROS: Limited due to cognition/dysarthria, but appears to deny CP, shortness of breath, nausea, vomiting, diarrhea.  Objective:   No results found. No results for input(s): WBC, HGB, HCT, PLT in the last 72 hours. Recent Labs    01/22/20 1022  NA 140  K 4.3  CL 104  CO2 24  GLUCOSE 109*  BUN 33*  CREATININE 0.40*  CALCIUM 9.2    Intake/Output Summary (Last 24 hours) at 01/24/2020 1444 Last data filed at 01/24/2020 0100 Gross per 24 hour  Intake --  Output 600 ml  Net -600 ml     Physical Exam: Vital Signs Blood pressure (!) 144/93, pulse 88, temperature (!) 97.5 F (36.4 C), resp. rate 20, height 5\' 6"  (1.676 m), weight 89.5 kg, SpO2 98 %.  Constitutional: No distress . Vital signs reviewed. HENT: Normocephalic.  Atraumatic. Eyes: EOMI. No discharge. Cardiovascular: No JVD.  RRR. Respiratory: Normal effort.  No stridor.  Bilateral clear to auscultation. GI: Non-distended.  BS +. Skin: Warm and dry.  Intact.  Lower extremity ecchymosis. Psych: Normal mood.  Normal behavior. Musc: Limited assessment due to mentation Neuro: Alert Dysarthria, unchanged Motor: Limited due to participation, no movement noted in right upper extremity  Assessment/Plan: 1. 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  Physiatrist is providing close team supervision and 24 hour management of active medical problems listed below.  Physiatrist and rehab team continue to assess barriers to discharge/monitor patient progress toward functional and medical goals  Care Tool:  Bathing        Body parts bathed by helper: Right arm, Left arm, Chest, Abdomen, Front perineal area,  Buttocks, Right upper leg, Left upper leg, Right lower leg, Left lower leg, Face     Bathing assist Assist Level: Dependent - Patient 0%     Upper Body Dressing/Undressing Upper body dressing   What is the patient wearing?: Pull over shirt    Upper body assist Assist Level: Dependent - Patient 0%    Lower Body Dressing/Undressing Lower body dressing      What is the patient wearing?: Incontinence brief     Lower body assist Assist for lower body dressing: Dependent - Patient 0%     Toileting Toileting    Toileting assist Assist for toileting: Dependent - Patient 0%     Transfers Chair/bed transfer  Transfers assist     Chair/bed transfer assist level: Dependent - mechanical lift     Locomotion Ambulation   Ambulation assist      Assist level: 2 helpers Assistive device: Other (comment) (swedish walker) Max distance: 10   Walk 10 feet activity   Assist     Assist level: 2 helpers Assistive device: Other (comment) (swedish walker)   Walk 50 feet activity   Assist Walk 50 feet with 2 turns activity did not occur: Safety/medical concerns         Walk 150 feet activity   Assist Walk 150 feet activity did not occur: Safety/medical concerns         Walk 10 feet on uneven surface  activity   Assist Walk 10 feet on uneven surfaces activity did not occur: Safety/medical concerns         Wheelchair  Assist Will patient use wheelchair at discharge?: Yes Type of Wheelchair: Manual           Wheelchair 50 feet with 2 turns activity    Assist    Wheelchair 50 feet with 2 turns activity did not occur: Safety/medical concerns (requires TIS for postural support)       Wheelchair 150 feet activity     Assist  Wheelchair 150 feet activity did not occur: Safety/medical concerns       Blood pressure (!) 144/93, pulse 88, temperature (!) 97.5 F (36.4 C), resp. rate 20, height 5\' 6"  (1.676 m), weight 89.5 kg, SpO2  98 %.  Medical Problem List and Plan: 1.  Right hemiparesis, dysphagia, cognitive deficits with disorientation, unintelligible speech lethargy secondary to bilateral intracranial hemorrhages with subsequent infarcts.  Continue CIR 2.  Antithrombotics: -DVT/anticoagulation:  Pharmaceutical: Heparin--hold                          Dopplers negative for DVT.              -antiplatelet therapy: DAPT 3. Pain Management: Tylenol prn.  Appears to be well controlled.  4. Mood: LCSW to follow for evaluation and support when appropriate.              -antipsychotic agents: N/A 5. Neuropsych: This patient is not capable of making decisions on her own behalf.  Continue trial of ritalin for arousal/stimulation 6. Skin/Wound Care: routine pressure relief measures.  7. Fluids/Electrolytes/Nutrition: NPO with Jevity TF- increased to 70 cc/hr so can be held 4 hours for therapy. Added water flushes.   Increased H2O flushes 8. ABLA: Monitor H/H.   Hemoglobin 12.5 on 9/9,?  Reliability, labs ordered for tomorrow 9. Thrombocytopenia:   Monitor for signs of bleeding.  Platelets 476 on 9/9,?  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 Oral care qid. 11. Resting tachycardia:              Monitor with increased exertion  Within normal limits on 9/12 12. Vasospasm prevention: To continue Nimotop for 3 additional days.  13. Bowel incontinence/loose stool: multifactorial, on abx for uti also  -dced ceftriaxone  -rectal tube dced 9/10   -stop senna-s, no stool softeners or laxatives  -asked RD to potentially change to more elemental formula  -added fiber and probiotic also 14. Proteus UTI: Ceftriaxone course completed 18.  Prediabetes, confounded by tube feeds  Relatively controlled on 9/12  Continue to monitor 19.  Elevated BUN  Increased water flushes, labs ordered for tomorrow  LOS: 4 days A FACE TO FACE EVALUATION WAS PERFORMED  Jackqueline Aquilar Lorie Phenix 01/24/2020, 2:44 PM

## 2020-01-25 ENCOUNTER — Inpatient Hospital Stay (HOSPITAL_COMMUNITY): Payer: BC Managed Care – PPO | Admitting: Occupational Therapy

## 2020-01-25 ENCOUNTER — Inpatient Hospital Stay (HOSPITAL_COMMUNITY): Payer: BC Managed Care – PPO | Admitting: Speech Pathology

## 2020-01-25 ENCOUNTER — Inpatient Hospital Stay (HOSPITAL_COMMUNITY): Payer: BC Managed Care – PPO | Admitting: Physical Therapy

## 2020-01-25 LAB — CBC
HCT: 37.7 % (ref 36.0–46.0)
Hemoglobin: 12 g/dL (ref 12.0–15.0)
MCH: 29.9 pg (ref 26.0–34.0)
MCHC: 31.8 g/dL (ref 30.0–36.0)
MCV: 94 fL (ref 80.0–100.0)
Platelets: 498 10*3/uL — ABNORMAL HIGH (ref 150–400)
RBC: 4.01 MIL/uL (ref 3.87–5.11)
RDW: 12.2 % (ref 11.5–15.5)
WBC: 9.2 10*3/uL (ref 4.0–10.5)
nRBC: 0 % (ref 0.0–0.2)

## 2020-01-25 LAB — GLUCOSE, CAPILLARY
Glucose-Capillary: 115 mg/dL — ABNORMAL HIGH (ref 70–99)
Glucose-Capillary: 124 mg/dL — ABNORMAL HIGH (ref 70–99)
Glucose-Capillary: 141 mg/dL — ABNORMAL HIGH (ref 70–99)
Glucose-Capillary: 147 mg/dL — ABNORMAL HIGH (ref 70–99)
Glucose-Capillary: 148 mg/dL — ABNORMAL HIGH (ref 70–99)
Glucose-Capillary: 155 mg/dL — ABNORMAL HIGH (ref 70–99)

## 2020-01-25 LAB — BASIC METABOLIC PANEL
Anion gap: 10 (ref 5–15)
BUN: 18 mg/dL (ref 8–23)
CO2: 23 mmol/L (ref 22–32)
Calcium: 9.3 mg/dL (ref 8.9–10.3)
Chloride: 102 mmol/L (ref 98–111)
Creatinine, Ser: 0.43 mg/dL — ABNORMAL LOW (ref 0.44–1.00)
GFR calc Af Amer: >60 mL/min (ref >60)
GFR calc non Af Amer: >60 mL/min (ref >60)
Glucose, Bld: 122 mg/dL — ABNORMAL HIGH (ref 70–99)
Potassium: 4.4 mmol/L (ref 3.5–5.1)
Sodium: 135 mmol/L (ref 135–145)

## 2020-01-25 MED ORDER — METHYLPHENIDATE HCL 5 MG PO TABS
10.0000 mg | ORAL_TABLET | Freq: Two times a day (BID) | ORAL | Status: DC
  Administered 2020-01-25 – 2020-01-30 (×7): 10 mg via NASOGASTRIC
  Filled 2020-01-25 (×7): qty 2

## 2020-01-25 MED ORDER — CHOLESTYRAMINE 4 G PO PACK
4.0000 g | PACK | Freq: Three times a day (TID) | ORAL | Status: DC
  Administered 2020-01-25 – 2020-02-04 (×22): 4 g
  Filled 2020-01-25 (×31): qty 1

## 2020-01-25 NOTE — Progress Notes (Signed)
Physical Therapy Session Note  Patient Details  Name: SAMARI BITTINGER MRN: [id number] Date of Birth: June 01, 1955  Today's Date: 01/25/2020 PT Individual Time: 1150-1202 PT Individual Time Calculation (min): 12 min   Short Term Goals: Week 1:  PT Short Term Goal 1 (Week 1): Pt will demonstrate static sit consistently w/min assist PT Short Term Goal 2 (Week 1): pt will perform STS w/mod assist of 2 PT Short Term Goal 3 (Week 1): Pt will ambulate 53ft w/LRAD and max assist of 2 PT Short Term Goal 4 (Week 1): Pt will tolerate passive cervical rotation to R 20* PT Short Term Goal 5 (Week 1): Pt will tolerate 1-2 hrs OOB in wc  Skilled Therapeutic Interventions/Progress Updates:  Pt seen to make up missed time from this AM. Pt received in TIS w/c & more alert. Transported pt to/from gym via w/c dependent assist for time management. Dynavision from w/c level with focus on visual scanning, sustained attention & forced use of extremities with pt requiring multimodal cuing to locate lights & hand over hand assist to press them as well as total assist for anterior shift to reach board. Pt requires cuing to raise head up & scan for lights. PT attempts to turn pt's head to neutral vs L cervical rotation with pt c/o pain & repositioned for increased comfort. Pt left in TIS w/c at nurses station with chair alarm & seat belt donned.  Therapy Documentation Precautions:  Precautions Precautions: Fall Precaution Comments: NPO Restrictions Weight Bearing Restrictions: Yes    Therapy/Group: Individual Therapy  [name] 01/25/2020, 12:41 PM

## 2020-01-25 NOTE — Progress Notes (Signed)
Occupational Therapy Session Note  Patient Details  Name: Erika Stanton MRN: [id number] Date of Birth: [birth year]  Today's Date: 01/25/2020 OT Individual Time: 1400-1500 OT Individual Time Calculation (min): 60 min    Short Term Goals: Week 1:  OT Short Term Goal 1 (Week 1): patient will roll in bed with min A, move from side lying to sitting edge of bed with mod A of one OT Short Term Goal 2 (Week 1): patient will tolerate unsupported sitting with min A for 10 minutes with head midline OT Short Term Goal 3 (Week 1): patient will use left UE to wash 25% of upper body and assist with donning OH shirt max A OT Short Term Goal 4 (Week 1): patient will scan environment both right and left to locate items with moderate cues  Skilled Therapeutic Interventions/Progress Updates:    patient in bed, alert, restless.  She denies pain.  Max A/dep to thread pants and complete CM via rolling bed level.  Rolling left min A, rolling right CGA.  Side lying to sitting edge of bed max A.  She continues with left lateral lean requires max A to maintain unsupported sitting.  SB transfer to/from bed, w/c and mat table max A of one with min A of 2nd person.  Provided lap tray for B UE support with good results.  Ongoing facilitation of head in midline and positioning at w/c level.  Attempted unsupported sitting edge of mat for posture and midline but restlessness increased and patient with poor tolerance at this time - assisted into reclined/supported position where she calmed down quickly and was able to tolerated bilateral UE inhibition and shoulder/scapular stretch and positioning.  Returned to bed at close of session.  Sit to supine max A of one.  HOB locked at 30, bed alarm set and call bell in reach.    Therapy Documentation Precautions:  Precautions Precautions: Fall Precaution Comments: NPO Restrictions Weight Bearing Restrictions: Yes   Therapy/Group: Individual Therapy  Carlos Levering 01/25/2020,  7:43 AM

## 2020-01-25 NOTE — Plan of Care (Signed)
  Problem: Consults Goal: RH BRAIN INJURY PATIENT EDUCATION Description: Description: See Patient Education module for eduction specifics Outcome: Progressing Goal: Skin Care Protocol Initiated - if Braden Score 18 or less Description: If consults are not indicated, leave blank or document N/A Outcome: Progressing Goal: Nutrition Consult-if indicated Outcome: Progressing Goal: Diabetes Guidelines if Diabetic/Glucose > 140 Description: If diabetic or lab glucose is > 140 mg/dl - Initiate Diabetes/Hyperglycemia Guidelines & Document Interventions  Outcome: Progressing   Problem: RH BOWEL ELIMINATION Goal: RH STG MANAGE BOWEL WITH ASSISTANCE Description: STG Manage Bowel with min Assistance. Outcome: Progressing Goal: RH STG MANAGE BOWEL W/MEDICATION W/ASSISTANCE Description: STG Manage Bowel with Medication with min Assistance. Outcome: Progressing   Problem: RH BLADDER ELIMINATION Goal: RH STG MANAGE BLADDER WITH ASSISTANCE Description: STG Manage Bladder With min Assistance Outcome: Progressing Goal: RH STG MANAGE BLADDER WITH MEDICATION WITH ASSISTANCE Description: STG Manage Bladder With Medication With min Assistance. Outcome: Progressing   Problem: RH SKIN INTEGRITY Goal: RH STG SKIN FREE OF INFECTION/BREAKDOWN Description: Skin to remain free from infection and breakdown with min assist while on CIR. Outcome: Progressing Goal: RH STG MAINTAIN SKIN INTEGRITY WITH ASSISTANCE Description: STG Maintain Skin Integrity With min Assistance. Outcome: Progressing Goal: RH STG ABLE TO PERFORM INCISION/WOUND CARE W/ASSISTANCE Description: STG Able To Perform Incision/Wound Care With min Assistance. Outcome: Progressing   Problem: RH SAFETY Goal: RH STG ADHERE TO SAFETY PRECAUTIONS W/ASSISTANCE/DEVICE Description: STG Adhere to Safety Precautions With min Assistance and appropriate assistive Device. Outcome: Progressing   Problem: RH COGNITION-NURSING Goal: RH STG USES MEMORY  AIDS/STRATEGIES W/ASSIST TO PROBLEM SOLVE Description: STG Uses Memory Aids/Strategies With min Assistance to Problem Solve. Outcome: Progressing Goal: RH STG ANTICIPATES NEEDS/CALLS FOR ASSIST W/ASSIST/CUES Description: STG Anticipates Needs/Calls for Assist With min Assistance/Cues. Outcome: Progressing   Problem: RH PAIN MANAGEMENT Goal: RH STG PAIN MANAGED AT OR BELOW PT'S PAIN GOAL Description: <3 on a 0-10 pain scale. Outcome: Progressing   Problem: RH KNOWLEDGE DEFICIT BRAIN INJURY Goal: RH STG INCREASE KNOWLEDGE OF SELF CARE AFTER BRAIN INJURY Description: Patient will demonstrate knowledge of medication management, diabetes management, dietary management, and follow up care with the MD post discharge with min assist from [facility] staff. Outcome: Progressing Goal: RH STG INCREASE KNOWLEDGE OF DYSPHAGIA/FLUID INTAKE Description: Patient will be able to demonstrate knowledge of dysphagia diets, thickened liquids, and swallowing precautions with min assist from rehab staff. Outcome: Progressing   Problem: RH Vision Goal: RH LTG Vision (Specify) Outcome: Progressing

## 2020-01-25 NOTE — Progress Notes (Signed)
Speech Language Pathology Daily Session Note  Patient Details  Name: Erika Stanton MRN: [id number] Date of Birth: 25-Dec-1955  Today's Date: 01/25/2020 SLP Individual Time: 7014-1030 SLP Individual Time Calculation (min): 55 min  Short Term Goals: Week 1: SLP Short Term Goal 1 (Week 1): Patient will increase speech intelligibility to ~50% at the phrase level with Max A multimodal cues for use of speech intelligibility strategies. SLP Short Term Goal 2 (Week 1): Patient will consume trials of ice chips with minimal overt s/s of aspiration over 2 sessions with Mod A vebral cues to assess readiness for repeat MBS SLP Short Term Goal 3 (Week 1): Patient will attend to a functional task for ~5 minutes with Max A multimodal cues.  Skilled Therapeutic Interventions: Skilled treatment session focused on cognitive and dysphagia goals. SLP facilitated session by attempting to administer trials. Patient declined trials today despite Max encouragement due to patient reporting "fear of choking." SLP provided education regarding trials of ice chips are the safest option at this time and patient eventually agreeable to one. Patient consumed one ice chip without overt s/s of aspiration but reported she did not want anymore due to "choking." Patient required constant cues for repositioning and attention throughout session with cues to keep her eyes open. Although lethargic, patient was oriented to place, month, year and situation. Patient's speech remains severely dysarthric with Max verbal cues needed for use of speech intelligibility strategies. Patient left upright in bed with alarm on and all needs within reach. Continue with current plan of care.      Pain No/Denies Pain   Therapy/Group: Individual Therapy  Domique Reardon 01/25/2020, 12:25 PM

## 2020-01-25 NOTE — Progress Notes (Signed)
Physical Therapy Session Note  Patient Details  Name: Erika Stanton MRN: [id number] Date of Birth: 07/28/1955  Today's Date: 01/25/2020 PT Individual Time: 3151-7616 PT Individual Time Calculation (min): 51 min   Short Term Goals: Week 1:  PT Short Term Goal 1 (Week 1): Pt will demonstrate static sit consistently w/min assist PT Short Term Goal 2 (Week 1): pt will perform STS w/mod assist of 2 PT Short Term Goal 3 (Week 1): Pt will ambulate 25ft w/LRAD and max assist of 2 PT Short Term Goal 4 (Week 1): Pt will tolerate passive cervical rotation to R 20* PT Short Term Goal 5 (Week 1): Pt will tolerate 1-2 hrs OOB in wc  Skilled Therapeutic Interventions/Progress Updates:  Pt received in bed with nurse exiting after giving meds. Pt able to verbalize more on this date & report she had an aneurysm. Pt noted to be incontinent of BM & PT provides max<>+2 assist for rolling L<>R with pt able to participate in placing UE on bed rail & assist with rolling. PT performs peri hygiene & pt with c/o pain with task due to skin breakdown on bottom with PT applying cream. PT provides total assist for donning brief, pants, socks & shirt but pt is able to lift RLE to assist with task. Pt requires max assist for R sidelying>sitting but does participate in pushing herself upright. Pt completes slide board transfer bed>w/c with mod assist +2 with PT providing total assist for hand placement and head/hips relationship but pt able to participate in scooting across board. Provided pt with another TIS w/c with better headrest & pt transfers w/c>mat, mat>new w/c with max assist +2 and max multimodal cuing for sequencing & technique. Positioned headrest to help decrease L cervical rotation. Pt then falling asleep & with decreased alertness. Pt left sitting in TIS w/c at nurses station with seat belt & chair alarm donned.   Therapy Documentation Precautions:  Precautions Precautions: Fall Precaution Comments: NPO  Restrictions Weight Bearing Restrictions: Yes General: PT Amount of Missed Time (min): 24 Minutes PT Missed Treatment Reason: Patient fatigue  Pain: Pt with c/o pain all over body in various positions with PT repositioning/adjusting pt to increased comfort.   Therapy/Group: Individual Therapy  [name] 01/25/2020, 12:35 PM

## 2020-01-25 NOTE — Progress Notes (Addendum)
[facility] PHYSICAL MEDICINE & REHABILITATION PROGRESS NOTE   Subjective/Complaints: Pt lying in bed to left. SLP says that she was refusing ice chips. Pt feels that ice chips are too much for her--(at least indicated that in so many words)  ROS: Limited due to cognitive/behavioral    Objective:   No results found. Recent Labs    01/25/20 0641  WBC 9.2  HGB 12.0  HCT 37.7  PLT 498*   Recent Labs    01/25/20 0641  NA 135  K 4.4  CL 102  CO2 23  GLUCOSE 122*  BUN 18  CREATININE 0.43*  CALCIUM 9.3   No intake or output data in the 24 hours ending 01/25/20 1206   Physical Exam: Vital Signs Blood pressure (!) 147/74, pulse 99, temperature 98 F (36.7 C), resp. rate 20, height 5\' 6"  (1.676 m), weight 89.5 kg, SpO2 97 %.  Constitutional: No distress . Vital signs reviewed. HEENT: EOMI, oral membranes moist,NGT Neck: supple Cardiovascular: RRR without murmur. No JVD    Respiratory/Chest: CTA Bilaterally without wheezes or rales. Normal effort    GI/Abdomen: BS +, non-tender, non-distended Ext: no clubbing, cyanosis, or edema Psych: flat, restless Musc: Limited assessment due to mentation Neuro: Awake but doesn't keep eyes open consistently Dysarthria, unchanged Motor: still no movement noted in right upper extremity  Assessment/Plan: 1. 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  Physiatrist is providing close team supervision and 24 hour management of active medical problems listed below.  Physiatrist and rehab team continue to assess barriers to discharge/monitor patient progress toward functional and medical goals  Care Tool:  Bathing        Body parts bathed by helper: Right arm, Left arm, Chest, Abdomen, Front perineal area, Buttocks, Right upper leg, Left upper leg, Right lower leg, Left lower leg, Face     Bathing  assist Assist Level: Dependent - Patient 0%     Upper Body Dressing/Undressing Upper body dressing   What is the patient wearing?: Pull over shirt    Upper body assist Assist Level: Dependent - Patient 0%    Lower Body Dressing/Undressing Lower body dressing      What is the patient wearing?: Incontinence brief     Lower body assist Assist for lower body dressing: Dependent - Patient 0%     Toileting Toileting    Toileting assist Assist for toileting: Dependent - Patient 0%     Transfers Chair/bed transfer  Transfers assist     Chair/bed transfer assist level: Dependent - mechanical lift     Locomotion Ambulation   Ambulation assist      Assist level: 2 helpers Assistive device: Other (comment) (swedish walker) Max distance: 10   Walk 10 feet activity   Assist     Assist level: 2 helpers Assistive device: Other (comment) (swedish walker)   Walk 50 feet activity   Assist Walk 50 feet with 2 turns activity did not occur: Safety/medical concerns         Walk 150 feet activity   Assist Walk 150 feet activity did not occur: Safety/medical concerns         Walk 10 feet on uneven surface  activity   Assist Walk 10 feet on uneven surfaces activity did not occur: Safety/medical concerns         Wheelchair     Assist Will patient use wheelchair at discharge?:  Yes Type of Wheelchair: Manual           Wheelchair 50 feet with 2 turns activity    Assist    Wheelchair 50 feet with 2 turns activity did not occur: Safety/medical concerns (requires TIS for postural support)       Wheelchair 150 feet activity     Assist  Wheelchair 150 feet activity did not occur: Safety/medical concerns       Blood pressure (!) 147/74, pulse 99, temperature 98 F (36.7 C), resp. rate 20, height 5\' 6"  (1.676 m), weight 89.5 kg, SpO2 97 %.  Medical Problem List and Plan: 1.  Right hemiparesis, dysphagia, cognitive deficits with  disorientation, unintelligible speech lethargy secondary to bilateral intracranial hemorrhages with subsequent infarcts.  Continue CIR 2.  Antithrombotics: -DVT/anticoagulation:  Pharmaceutical: Heparin--hold                          Dopplers negative for DVT.              -antiplatelet therapy: DAPT 3. Pain Management: Tylenol prn.  Appears to be well controlled.  4. Mood: LCSW to follow for evaluation and support when appropriate.              -antipsychotic agents: N/A 5. Neuropsych: This patient is not capable of making decisions on her own behalf.  Continue trial of ritalin for arousal/stimulation--increase to 10mg  9/13 6. Skin/Wound Care: routine pressure relief measures.  7. Fluids/Electrolytes/Nutrition: NPO with Jevity TF- increased to 70 cc/hr so can be held 4 hours for therapy. Added water flushes.   Increased H2O flushes 8. ABLA: Monitor H/H.   Hemoglobin 12.5 on 9/9--12.0 9/13 9. Thrombocytopenia:   Monitor for signs of bleeding.  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 Oral care qid. 11. Resting tachycardia:              Monitor with increased exertion  Within normal limits on 9/13 12. Vasospasm prevention: To continue Nimotop for 3 additional days.  13. Bowel incontinence/loose stool: multifactorial, on abx for uti also  -continue fiber/probiotic  -stools still loose  -9/13 add cholestyramine TID 14. Proteus UTI: Ceftriaxone course completed 18.  Prediabetes, confounded by tube feeds  Relatively controlled on 9/13  Continue to monitor 19.  Elevated BUN  Labs stable to improved 9/13 LOS: 5 days A FACE TO FACE EVALUATION WAS PERFORMED  Meredith Staggers 01/25/2020, 12:06 PM

## 2020-01-25 NOTE — Progress Notes (Signed)
Orthopedic Tech Progress Note Patient Details:  MYLAN LENGYEL 06/29/1955 [id number] Ordered Resting WHO Patient ID: Laverna Peace, female   DOB: December 02, 1955, 63 y.o.   MRN: [id number]   Tammy Sours 01/25/2020, 2:46 PM

## 2020-01-26 ENCOUNTER — Inpatient Hospital Stay (HOSPITAL_COMMUNITY): Payer: BC Managed Care – PPO | Admitting: Occupational Therapy

## 2020-01-26 ENCOUNTER — Inpatient Hospital Stay (HOSPITAL_COMMUNITY): Payer: BC Managed Care – PPO | Admitting: Physical Therapy

## 2020-01-26 ENCOUNTER — Inpatient Hospital Stay (HOSPITAL_COMMUNITY): Payer: BC Managed Care – PPO | Admitting: Speech Pathology

## 2020-01-26 ENCOUNTER — Encounter (HOSPITAL_COMMUNITY): Payer: Self-pay | Admitting: Physical Medicine & Rehabilitation

## 2020-01-26 ENCOUNTER — Inpatient Hospital Stay (HOSPITAL_COMMUNITY): Payer: BC Managed Care – PPO

## 2020-01-26 DIAGNOSIS — D500 Iron deficiency anemia secondary to blood loss (chronic): Secondary | ICD-10-CM

## 2020-01-26 DIAGNOSIS — K921 Melena: Secondary | ICD-10-CM

## 2020-01-26 LAB — CBC
HCT: 30.3 % — ABNORMAL LOW (ref 36.0–46.0)
HCT: 30.9 % — ABNORMAL LOW (ref 36.0–46.0)
Hemoglobin: 9.8 g/dL — ABNORMAL LOW (ref 12.0–15.0)
Hemoglobin: 9.8 g/dL — ABNORMAL LOW (ref 12.0–15.0)
MCH: 29.9 pg (ref 26.0–34.0)
MCH: 30.5 pg (ref 26.0–34.0)
MCHC: 31.7 g/dL (ref 30.0–36.0)
MCHC: 32.3 g/dL (ref 30.0–36.0)
MCV: 94.2 fL (ref 80.0–100.0)
MCV: 94.4 fL (ref 80.0–100.0)
Platelets: 524 10*3/uL — ABNORMAL HIGH (ref 150–400)
Platelets: 548 10*3/uL — ABNORMAL HIGH (ref 150–400)
RBC: 3.21 MIL/uL — ABNORMAL LOW (ref 3.87–5.11)
RBC: 3.28 MIL/uL — ABNORMAL LOW (ref 3.87–5.11)
RDW: 12.3 % (ref 11.5–15.5)
RDW: 12.4 % (ref 11.5–15.5)
WBC: 17.3 10*3/uL — ABNORMAL HIGH (ref 4.0–10.5)
WBC: 18.4 10*3/uL — ABNORMAL HIGH (ref 4.0–10.5)
nRBC: 0.1 % (ref 0.0–0.2)
nRBC: 0.1 % (ref 0.0–0.2)

## 2020-01-26 LAB — GLUCOSE, CAPILLARY
Glucose-Capillary: 124 mg/dL — ABNORMAL HIGH (ref 70–99)
Glucose-Capillary: 127 mg/dL — ABNORMAL HIGH (ref 70–99)
Glucose-Capillary: 136 mg/dL — ABNORMAL HIGH (ref 70–99)
Glucose-Capillary: 143 mg/dL — ABNORMAL HIGH (ref 70–99)
Glucose-Capillary: 158 mg/dL — ABNORMAL HIGH (ref 70–99)
Glucose-Capillary: 162 mg/dL — ABNORMAL HIGH (ref 70–99)
Glucose-Capillary: 162 mg/dL — ABNORMAL HIGH (ref 70–99)

## 2020-01-26 LAB — URINALYSIS, ROUTINE W REFLEX MICROSCOPIC
Bilirubin Urine: NEGATIVE
Glucose, UA: NEGATIVE mg/dL
Hgb urine dipstick: NEGATIVE
Ketones, ur: NEGATIVE mg/dL
Leukocytes,Ua: NEGATIVE
Nitrite: NEGATIVE
Protein, ur: NEGATIVE mg/dL
Specific Gravity, Urine: 1.027 (ref 1.005–1.030)
pH: 5 (ref 5.0–8.0)

## 2020-01-26 LAB — PROCALCITONIN: Procalcitonin: <0.1 ng/mL

## 2020-01-26 MED ORDER — SODIUM CHLORIDE 0.9 % IV SOLN
80.0000 mg | Freq: Once | INTRAVENOUS | Status: AC
  Administered 2020-01-26: 80 mg via INTRAVENOUS
  Filled 2020-01-26: qty 80

## 2020-01-26 MED ORDER — SODIUM CHLORIDE 0.9% FLUSH: 10.0000 mL | INTRAVENOUS | Status: DC | PRN

## 2020-01-26 MED ORDER — PANTOPRAZOLE SODIUM 40 MG IV SOLR: 40.0000 mg | Freq: Two times a day (BID) | INTRAVENOUS | Status: DC

## 2020-01-26 MED ORDER — CLOPIDOGREL BISULFATE 75 MG PO TABS
75.0000 mg | ORAL_TABLET | Freq: Every day | ORAL | Status: DC
  Administered 2020-01-30 – 2020-02-04 (×6): 75 mg
  Filled 2020-01-26 (×6): qty 1

## 2020-01-26 MED ORDER — PANTOPRAZOLE SODIUM 40 MG PO PACK: 40.0000 mg | PACK | Freq: Two times a day (BID) | ORAL | Status: DC

## 2020-01-26 MED ORDER — ASPIRIN 325 MG PO TABS
325.0000 mg | ORAL_TABLET | Freq: Every day | ORAL | Status: DC
  Administered 2020-01-30 – 2020-02-04 (×6): 325 mg
  Filled 2020-01-26 (×6): qty 1

## 2020-01-26 MED ORDER — PANTOPRAZOLE SODIUM 40 MG IV SOLR: 40.0000 mg | INTRAVENOUS | Status: DC

## 2020-01-26 MED ORDER — SODIUM CHLORIDE 0.9 % IV SOLN
8.0000 mg/h | INTRAVENOUS | Status: DC
  Administered 2020-01-26 – 2020-01-28 (×4): 8 mg/h via INTRAVENOUS
  Filled 2020-01-26 (×5): qty 80

## 2020-01-26 MED ORDER — SODIUM CHLORIDE 0.9% FLUSH
10.0000 mL | Freq: Two times a day (BID) | INTRAVENOUS | Status: DC
  Administered 2020-01-27 – 2020-02-07 (×18): 10 mL

## 2020-01-26 NOTE — Patient Care Conference (Signed)
Inpatient RehabilitationTeam Conference and Plan of Care Update Date: 01/26/2020   Time: 10:38 AM    Patient Name: Erika Stanton      Medical Record Number: [id number]  Date of Birth: April 06, 1956 Sex: Female         Room/Bed: 4W17C/4W17C-01 Payor Info: Payor: [place] / Plan: [facility] PPO / Product Type: *No Product type* /    Admit Date/Time:  01/20/2020  5:15 PM  Primary Diagnosis:  Cerebral aneurysm rupture [facility])  Hospital Problems: Principal Problem:   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[place]) Active Problems:   Elevated BUN   Prediabetes   Incontinence of feces    Expected Discharge Date: Expected Discharge Date: 02/23/20  Team Members Present: Physician leading conference: Dr. Alger Simons Care Coodinator Present: Loralee Pacas, [place] Nurse Present: Renda Rolls, LPN PT Present: Lavone Nian, PT OT Present: Elisabeth Most, OT SLP Present: Weston Anna, SLP PPS Coordinator present : Ileana Ladd, Burna Mortimer, SLP     Current Status/Progress Goal Weekly Team Focus  Bowel/Bladder   incontinent BB  continent  assess toileting q shift and prn   Swallow/Nutrition/ Hydration   NPO with NG tube  Min A  arousal and ability to follow commands with trials for readiness to participate in an MBS   ADL's   dependant  min assistance  attention to task, head/trunk mobility, midline orientation, functional use of bilateral UEs   Mobility   +2 overall for bed mobility, bed<>w/c transfers  min/mod assist overall  bed mobility, transfers, sitting balance, cognition, activity tolerance, NMR   Communication   Max A for speech intelligibility, language appears intact  Min A  Slow rate and over-articulation for speech at word and phrase level to maximize intelligibility   Safety/Cognition/ Behavioral Observations  Max-Total A  Min A  arousal, sustained attention, following commands   Pain   no pain     Assess pain q shift and prn   Skin   bruises  no  bruise  Assess skin q shift and prn     Discharge Planning:  D/c home with husband   Team Discussion: Incontinent B/B, Tolerating tube feedings well. Vomiting due to sliding down in the bed. Multiple attempts to reposition to keep [place] at 30. Can answer questions. Rolling in bed, moving legs better for OT but dependent for self care. +2 assist for transfers. Needs to up in a chair but not in room so will need to be at nurses at the nurses station. Patient on target to meet rehab goals: yes  *See Care Plan and progress notes for long and short-term goals.   Revisions to Treatment Plan:  GI to see her due to black stools, holding blood thinners.  Teaching Needs: Continue family education.  Current Barriers to Discharge: Incontinence, Weight and Behavior  Possible Resolutions to Barriers: Continue medication regimen, continue working on GI issues.     Medical Summary Current Status: more alert but still cognitively impaired. tolerating TF but has had loose stool--->black this morning, drop in hgb. torticollis (left). ritialin started for attention/arousal. RUE weakness  Barriers to Discharge: Behavior;Medical stability   Possible Resolutions to [employer] Focus: ongoing mgt of GI issues, ABLA. maximize arousal and attention.   Continued Need for Acute Rehabilitation Level of Care: The patient requires daily medical management by a physician with specialized training in physical medicine and rehabilitation for the following reasons: Direction of a multidisciplinary physical rehabilitation program to maximize functional independence : Yes Medical management of  patient stability for increased activity during participation in an intensive rehabilitation regime.: Yes Analysis of laboratory values and/or radiology reports with any subsequent need for medication adjustment and/or medical intervention. : Yes   I attest that I was present, lead the team conference, and concur with the  assessment and plan of the team.   Cristi Loron 01/26/2020, 2:05 PM

## 2020-01-26 NOTE — Progress Notes (Signed)
Occupational Therapy Session Note  Patient Details  Name: Erika Stanton MRN: [id number] Date of Birth: 08/14/1955  Today's Date: 01/26/2020 OT Individual Time: 1330-1415 OT Individual Time Calculation (min): 45 min    Short Term Goals: Week 1:  OT Short Term Goal 1 (Week 1): patient will roll in bed with min A, move from side lying to sitting edge of bed with mod A of one OT Short Term Goal 2 (Week 1): patient will tolerate unsupported sitting with min A for 10 minutes with head midline OT Short Term Goal 3 (Week 1): patient will use left UE to wash 25% of upper body and assist with donning [place] shirt max A OT Short Term Goal 4 (Week 1): patient will scan environment both right and left to locate items with moderate cues  Skilled Therapeutic Interventions/Progress Updates:    Patient in bed, alert, she denies pain at this time but states that she does not want to get up due to fatigue.  She did agree to sitting edge of bed with encouragement.  Rolling left in bed with min A.  Side lying to sitting edge of bed with mod A.  She tolerated unsupported sitting with mod A to maintain, facilitation of back extension, min A for cervical spine and midline orientation.  Completed lateral leans with patient assisting at trunk to return to upright.  She attempted to wash face while in seated position - able to hold wash cloth with left hand and lift to chin - max A overall to wash face.   Increased head control noted this session and less pushing laterally.  Sitting to supine with max A.  She attempted to reach with left hand to remove right sock - max A to complete.  Completed head turn and gentle neck ROM, increased visual focusing on left side with cues and increased time.  She was able to read signs in room this session.  She remained in bed at close of session, HOB> 30, bed alarm set, husband present and call bell in reach.    Therapy Documentation Precautions:  Precautions Precautions:  Fall Precaution Comments: NPO Restrictions Weight Bearing Restrictions: Yes  Therapy/Group: Individual Therapy  Carlos Levering 01/26/2020, 7:36 AM

## 2020-01-26 NOTE — Progress Notes (Signed)
Physical Therapy Session Note  Patient Details  Name: Erika Stanton MRN: [id number] Date of Birth: [birth year]  Today's Date: 01/26/2020 PT Individual Time: 0805-0858  PT Individual Time Calculation (min): 53 min   Short Term Goals: Week 1:  PT Short Term Goal 1 (Week 1): Pt will demonstrate static sit consistently w/min assist PT Short Term Goal 2 (Week 1): pt will perform STS w/mod assist of 2 PT Short Term Goal 3 (Week 1): Pt will ambulate 63ft w/LRAD and max assist of 2 PT Short Term Goal 4 (Week 1): Pt will tolerate passive cervical rotation to R 20* PT Short Term Goal 5 (Week 1): Pt will tolerate 1-2 hrs OOB in wc  Skilled Therapeutic Interventions/Progress Updates:  Treatment 1: Pt received in bed with nursing present in room & administering meds. PT provides total/dep assist for donning fresh brief & clothing (pants, shirt, non skid socks) bed level. Pt requires +2 assist for rolling & max multimodal cuing for technique, max assist supine>sit. Pt requires + 2 for slide board bed>w/c with pt not initiating/participating in transfer as much on this date & PT providing total assist for head/hips relationship & scooting across board. While nurse administered meds PT attempted to brush pt's hair dependent assist but pt frequently c/o pain with grooming so activity ended - note left for husband to bring in hair detangler. Transported pt to/from dayroom via w/c dependent assist. Pt completes slide board w/c>mat with dependent +2 assist. Attempted to work on static sitting with focus on upright posture, activity tolerance, upright head (pt requires total assist to elevate head vs forward flexion), sustained attention & locating object. Pt repeatedly states "let me lay down, let me go back to bed" then eventually "y'all are torturing me" with PT providing education & encouragement re: participation in PT & providing rest break leaning back on PT. Pt then begins to vomit & afterwards is provided  max/total assist for stand pivot to w/c, +2 total assist slide board w/c>bed & nurse notified. Pt left in bed asleep, HOB elevated to 30 degrees, call bell in reach.    Treatment 2: Attempted to see pt for scheduled PT session but pt in nursing care (IV placement). Pt missed 30 minutes of skilled PT treatment 2/2 nursing care, will f/u per POC.   Therapy Documentation Precautions:  Precautions Precautions: Fall Precaution Comments: NPO Restrictions Weight Bearing Restrictions: Yes General: PT Amount of Missed Time (min): 30 Minutes PT Missed Treatment Reason: Nursing care    Therapy/Group: Individual Therapy  [name] 01/26/2020, 3:53 PM

## 2020-01-26 NOTE — Progress Notes (Addendum)
PHYSICAL MEDICINE & REHABILITATION PROGRESS NOTE   Subjective/Complaints: Pt lying in bed. Had black, loose stool overnight. Pt denies pain this morning. Was tachycardic, but this has been her baseline  ROS: Limited due to cognitive/behavioral    Objective:   No results found. Recent Labs    01/26/20 0020 01/26/20 0404  WBC 17.3* 18.4*  HGB 9.8* 9.8*  HCT 30.9* 30.3*  PLT 524* 548*   Recent Labs    01/25/20 0641  NA 135  K 4.4  CL 102  CO2 23  GLUCOSE 122*  BUN 18  CREATININE 0.43*  CALCIUM 9.3    Intake/Output Summary (Last 24 hours) at 01/26/2020 0925 Last data filed at 01/26/2020 0826 Gross per 24 hour  Intake 65 ml  Output 800 ml  Net -735 ml     Physical Exam: Vital Signs Blood pressure 123/82, pulse (!) 105, temperature 98.7 F (37.1 C), resp. rate 18, height 5\' 6"  (1.676 m), weight 89.5 kg, SpO2 97 %.  Constitutional: No distress . Vital signs reviewed. HEENT: EOMI, oral membranes moist Neck: head turned to left. SCM tight Cardiovascular: RRR without murmur. No JVD    Respiratory/Chest: CTA Bilaterally without wheezes or rales. Normal effort    GI/Abdomen: BS +, non-tender, non-distended Ext: no clubbing, cyanosis, or edema Psych: pleasant and cooperative Musc: Limited assessment due to mentation Neuro: looks to left, fairly alert. Phonating, answering basic questions. Follows simple commands. Dysarthric. Open eyes when cued.  Motor: still no movement noted in right upper extremity Skin: numerous bruises along limbs and abdomen, largely from IV access, sq heparin   Assessment/Plan: 1. 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  Physiatrist is providing close team supervision and 24 hour management of active medical problems listed below.  Physiatrist and rehab team continue to assess barriers to  discharge/monitor patient progress toward functional and medical goals  Care Tool:  Bathing        Body parts bathed by helper: Right arm, Left arm, Chest, Abdomen, Front perineal area, Buttocks, Right upper leg, Left upper leg, Right lower leg, Left lower leg, Face     Bathing assist Assist Level: Dependent - Patient 0%     Upper Body Dressing/Undressing Upper body dressing   What is the patient wearing?: Pull over shirt    Upper body assist Assist Level: Dependent - Patient 0%    Lower Body Dressing/Undressing Lower body dressing      What is the patient wearing?: Pants     Lower body assist Assist for lower body dressing: Maximal Assistance - Patient 25 - 49%     Toileting Toileting    Toileting assist Assist for toileting: Dependent - Patient 0%     Transfers Chair/bed transfer  Transfers assist     Chair/bed transfer assist level: 2 Helpers     Locomotion Ambulation   Ambulation assist      Assist level: 2 helpers Assistive device: Other (comment) (swedish walker) Max distance: 10   Walk 10 feet activity   Assist     Assist level: 2 helpers Assistive device: Other (comment) (swedish walker)   Walk 50 feet activity   Assist Walk 50 feet with 2 turns activity did not occur: Safety/medical concerns         Walk 150 feet activity   Assist Walk 150 feet activity did not occur: Safety/medical concerns  Walk 10 feet on uneven surface  activity   Assist Walk 10 feet on uneven surfaces activity did not occur: Safety/medical concerns         Wheelchair     Assist Will patient use wheelchair at discharge?: Yes Type of Wheelchair: Manual           Wheelchair 50 feet with 2 turns activity    Assist    Wheelchair 50 feet with 2 turns activity did not occur: Safety/medical concerns (requires TIS for postural support)       Wheelchair 150 feet activity     Assist  Wheelchair 150 feet activity did  not occur: Safety/medical concerns       Blood pressure 123/82, pulse (!) 105, temperature 98.7 F (37.1 C), resp. rate 18, height 5\' 6"  (1.676 m), weight 89.5 kg, SpO2 97 %.  Medical Problem List and Plan: 1.  Right hemiparesis, dysphagia, cognitive deficits with disorientation, unintelligible speech lethargy secondary to bilateral intracranial hemorrhages with subsequent infarcts.  Continue CIR, team conference today 2.  Antithrombotics: -DVT/anticoagulation:  Pharmaceutical: Heparin--hold                          Dopplers negative for DVT.              -antiplatelet therapy: DAPT--hold plavix, asa for now 3. Pain Management: Tylenol prn.  Appears to be well controlled.  4. Mood: LCSW to follow for evaluation and support when appropriate.              -antipsychotic agents: N/A 5. Neuropsych: This patient is not capable of making decisions on her own behalf.  Continue trial of ritalin for arousal/stimulation--increased to 10mg  9/13 6. Skin/Wound Care: routine pressure relief measures.  7. Fluids/Electrolytes/Nutrition: NPO with Jev[place] TF- increased to 70 cc/hr so can be held 4 hours for therapy. Added water flushes.   Increasedd H2O flushes 8. ABLA/?GIB:     Liquid black stool overnight. hgb drop from 12.0 to 9.8 this morning.   -hold plavix/asa for now. Stop sq heparin   -check stool for OB  -serial CBC's  -increase protonix to bid  -GI consult 9. Thrombocytopenia:   Monitor for signs of bleeding.  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 Oral care qid. 11. Resting tachycardia:              has been ranging from 90's to 100's 12. Vasospasm prevention: To continue Nimotop for 3 additional days.  13. Bowel incontinence/loose stool: multifactorial, on abx for uti also  -continue fiber/probiotic  -stools still loose  -9/13 added cholestyramine TID  -9/14 see #8 14. Proteus UTI: Ceftriaxone course completed  -f/u ua negative 18.  Prediabetes,  confounded by tube feeds  Relatively controlled on 9/13  Continue to monitor 19.  Leukoctyosis:   -UA negative, only one low grade temp last night, procalcitonin wnl  -check 2 view cxr (vomited this morning)  -might be reactive related to GIB  -treat supportively, serial labs   LOS: 6 days A FACE TO FACE EVALUATION WAS PERFORMED  Meredith Staggers 01/26/2020, 9:25 AM

## 2020-01-26 NOTE — Consult Note (Signed)
Referring Provider:  [facility]         Primary Care Physician:  Su Ley, MD Primary Gastroenterologist:  unassigned           We were asked to see this patient for:  GI bleed                ASSESSMENT / PLAN:   # Upper GI bleed on plavix and asa. Melenic stools around MN, again around 8P30am and melena on my exam just now.  --change PPI to continuous drip --monitor H+H, transfuse if needed.  --Plavix and Asa placed on hold today.  --She will need an EGD. I will talk with husband about the procedure  # ICH, bilateral with associated infarcts.  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 Started on plavix / asa post procedure 8/25    HPI:                                                                                                                             Chief Complaint: GI bleed  Erika Stanton is a 64 y.o. female with a pmh significant for, not necessarily limited to: intracranial hemorrhage / infarcts, cholecystectomy, obesity.   Patient present to ED 8/24 with headache and left flaccidity. 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 She was started on ASA and Plavix for intracranial stent. Post-op she had she didn't recover expectantly. She had altered mental status, became obtunded. Follow imaging remarkable for acute cortical and subcortical infarcts. EEG showed evidence for epileptogenicity arising from right temporal region as well as diffuse encephalopathy, nonspecific. Started on Keppra and Nimotop. Patient transferred to inpatient rehab for interdisciplinary therapy on 01/20/20.    At time of admission to hospital for brain bleed patient's hgb was 12.2. It subsequently declined but stabilized in 10-11 range. On 9/8 hgb was 8.3 but this probably wasn't accurate as the  following day it was up to 12.5 without a blood transfusion. Hgb remained in 12 range until today when it declined to 9.8 in setting of black stools. Hgb  verified by repeat lab. She had liquid black stool around MN, again around 8:30 am today.    PREVIOUS ENDOSCOPIC EVALUATIONS / PERTINENT STUDIES     Past Surgical History:  Procedure Laterality Date  . CHOLECYSTECTOMY    . IR 3D INDEPENDENT WKST  01/06/2020  . IR ANGIO INTRA EXTRACRAN SEL INTERNAL CAROTID BILAT MOD SED  01/06/2020  . IR ANGIO VERTEBRAL SEL VERTEBRAL UNI L MOD SED  01/06/2020  . IR ANGIOGRAM FOLLOW UP STUDY  01/06/2020  . IR ANGIOGRAM FOLLOW UP STUDY  01/06/2020  . IR ANGIOGRAM FOLLOW UP STUDY  01/06/2020  . IR ANGIOGRAM FOLLOW UP STUDY  01/06/2020  . IR ANGIOGRAM FOLLOW UP STUDY  01/06/2020  . IR ANGIOGRAM FOLLOW UP  STUDY  01/06/2020  . IR ANGIOGRAM FOLLOW UP STUDY  01/06/2020  . IR ANGIOGRAM FOLLOW UP STUDY  01/06/2020  . IR ANGIOGRAM FOLLOW UP STUDY  01/06/2020  . IR ANGIOGRAM FOLLOW UP STUDY  01/06/2020  . IR NEURO EACH ADD'L AFTER BASIC UNI RIGHT (MS)  01/06/2020  . IR TRANSCATH/EMBOLIZ  01/06/2020  . RADIOLOGY WITH ANESTHESIA N/A 01/06/2020   Procedure: IR WITH ANESTHESIA FOR ANEURYSM;  Surgeon: Consuella Lose, MD;  Location: [facility];  Service: Radiology;  Laterality: N/A;  . TUBAL LIGATION      Prior to Admission medications   Medication Sig Start Date End Date Taking? Authorizing Provider  aspirin 325 MG tablet Place 1 tablet (325 mg total) into feeding tube daily. 01/21/20   Costella, Vista Mink, PA-C  clopidogrel (PLAVIX) 75 MG tablet Place 1 tablet (75 mg total) into feeding tube daily. 01/21/20   Costella, Vista Mink, PA-C  niMODipine (NYMALIZE) 6 MG/ML SOLN Place 10 mLs (60 mg total) into feeding tube every 4 (four) hours. 01/20/20   Costella, Vista Mink, PA-C    Current Facility-Administered Medications  Medication Dose Route Frequency Provider Last Rate Last Admin  . acetaminophen (TYLENOL) tablet 325-650 mg  325-650  mg Per Tube Q4H PRN Bary Leriche, PA-C   650 mg at 01/26/20 0819  . alum & mag hydroxide-simeth (MAALOX/MYLANTA) 200-200-20 MG/5ML suspension 30 mL  30 mL Per Tube Q4H PRN Love, Ivan Anchors, PA-C      . [START ON 01/29/2020] aspirin tablet 325 mg  325 mg Per Tube Daily Meredith Staggers, MD      . bisacodyl (DULCOLAX) suppository 10 mg  10 mg Rectal Daily PRN Love, Pamela S, PA-C      . chlorhexidine (PERIDEX) 0.12 % solution 15 mL  15 mL Mouth Rinse BID Bary Leriche, PA-C   15 mL at 01/25/20 2106  . Chlorhexidine Gluconate Cloth 2 % PADS 6 each  6 each Topical Daily Bary Leriche, PA-C   6 each at 01/24/20 9024  . cholestyramine (QUESTRAN) packet 4 g  4 g Per Tube TID Meredith Staggers, MD   4 g at 01/25/20 2324  . [START ON 01/29/2020] clopidogrel (PLAVIX) tablet 75 mg  75 mg Per Tube Daily Meredith Staggers, MD      . diphenhydrAMINE (BENADRYL) 12.5 MG/5ML elixir 12.5-25 mg  12.5-25 mg Per Tube Q6H PRN Love, Pamela S, PA-C      . feeding supplement (PROSource TF) liquid 45 mL  45 mL Per Tube Daily Meredith Staggers, MD   45 mL at 01/26/20 0810  . feeding supplement (VITAL 1.5 CAL) liquid 1,000 mL  1,000 mL Per Tube Continuous Meredith Staggers, MD 65 mL/hr at 01/22/20 1533 1,000 mL at 01/22/20 1533  . free water 200 mL  200 mL Per Tube Q6H Meredith Staggers, MD   200 mL at 01/26/20 0508  . Gerhardt's butt cream   Topical PRN Love, Pamela S, PA-C      . guaiFENesin-dextromethorphan (ROBITUSSIN DM) 100-10 MG/5ML syrup 5-10 mL  5-10 mL Per Tube Q6H PRN Love, Pamela S, PA-C      . insulin aspart (novoLOG) injection 0-15 Units  0-15 Units Subcutaneous Q4H Bary Leriche, PA-C   2 Units at 01/26/20 0435  . MEDLINE mouth rinse  15 mL Mouth Rinse q12n4p Bary Leriche, PA-C   15 mL at 01/25/20 1727  . methylphenidate (RITALIN) tablet 10 mg  10 mg Per NG tube  BID WC Meredith Staggers, MD   10 mg at 01/26/20 0809  . pantoprazole sodium (PROTONIX) 40 mg/20 mL oral suspension 40 mg  40 mg Per Tube BID  Alger Simons T, MD      . polycarbophil [facility]) tablet 625 mg  625 mg Per NG tube Daily Meredith Staggers, MD   625 mg at 01/26/20 0809  . prochlorperazine (COMPAZINE) tablet 5-10 mg  5-10 mg Per Tube Q6H PRN Love, Pamela S, PA-C       Or  . prochlorperazine (COMPAZINE) injection 5-10 mg  5-10 mg Intramuscular Q6H PRN Love, Pamela S, PA-C       Or  . prochlorperazine (COMPAZINE) suppository 12.5 mg  12.5 mg Rectal Q6H PRN Love, Pamela S, PA-C      . saccharomyces boulardii (FLORASTOR) capsule 250 mg  250 mg Per NG tube BID Meredith Staggers, MD   250 mg at 01/26/20 3570  . traZODone (DESYREL) tablet 25 mg  25 mg Per Tube QHS PRN Bary Leriche, PA-C   25 mg at 01/25/20 2327    Allergies as of 01/20/2020 - Review Complete 01/20/2020  Allergen Reaction Noted  . Sulfa antibiotics Rash 03/24/2013    Family History  Problem Relation Age of Onset  . Cancer Mother   . Congestive Heart Failure Mother   . Lung cancer Father   . Lung cancer Sister     Social History   Socioeconomic History  . Marital status: Married    Spouse name: Not on file  . Number of children: Not on file  . Years of education: Not on file  . Highest education level: Not on file  Occupational History  . Not on file  Tobacco Use  . Smoking status: Former Research scientist (life sciences)  . Smokeless tobacco: Never Used  Substance and Sexual Activity  . Alcohol use: Not on file  . Drug use: Not on file  . Sexual activity: Not on file  Other Topics Concern  . Not on file  Social History Narrative  . Not on file   Social Determinants of Health   Financial Resource Strain:   . Difficulty of Paying Living Expenses: Not on file  Food Insecurity:   . Worried About Charity fundraiser in the Last Year: Not on file  . Ran Out of Food in the Last Year: Not on file  Transportation Needs:   . Lack of Transportation (Medical): Not on file  . Lack of Transportation (Non-Medical): Not on file  Physical Activity:   . Days of Exercise  per Week: Not on file  . Minutes of Exercise per Session: Not on file  Stress:   . Feeling of Stress : Not on file  Social Connections:   . Frequency of Communication with Friends and Family: Not on file  . Frequency of Social Gatherings with Friends and Family: Not on file  . Attends Religious Services: Not on file  . Active Member of Clubs or Organizations: Not on file  . Attends Archivist Meetings: Not on file  . Marital Status: Not on file  Intimate Partner Violence:   . Fear of Current or Ex-Partner: Not on file  . Emotionally Abused: Not on file  . Physically Abused: Not on file  . Sexually Abused: Not on file    Review of Systems: All systems reviewed and negative except where noted in HPI.    OBJECTIVE:    Physical Exam: Vital signs in last 24 hours:  Temp:  [97.3 F (36.3 C)-99.6 F (37.6 C)] 98.7 F (37.1 C) (09/14 0641) Pulse Rate:  [72-122] 105 (09/14 0641) Resp:  [18-20] 18 (09/14 0641) BP: (108-126)/(82-95) 123/82 (09/14 0641) SpO2:  [95 %-98 %] 97 % (09/14 0641) Last BM Date: 01/25/20 General:   Alert, obese female in NAD. Has SB feeding tube Psych:  Cooperative.  Eyes:  Pupils equal, sclera clear, no icterus.   Conjunctiva pink. Ears:  Normal auditory acuity. Nose:  No deformity, discharge,  or lesions. Neck:  Supple; no masses Lungs:  Clear throughout to auscultation.   No wheezes, crackles, or rhonchi.  Heart:  Regular rate and rhythm; no murmurs, no lower extremity edema Abdomen:  Soft, non-distended, nontender, BS active, no palp mass   Rectal:  Soft melenic stool in vault.   Msk:  Symmetrical without gross deformities. . Neurologic:  Alert , eyes look right. Tries to follow commands. Answers basic questions but difficult to understand. Unintelligible speech.  Skin:  Intact without significant lesions or rashes.  Filed Weights   01/20/20 1724  Weight: 89.5 kg     Scheduled inpatient medications . [START ON 01/29/2020] aspirin  325  mg Per Tube Daily  . chlorhexidine  15 mL Mouth Rinse BID  . Chlorhexidine Gluconate Cloth  6 each Topical Daily  . cholestyramine  4 g Per Tube TID  . [START ON 01/29/2020] clopidogrel  75 mg Per Tube Daily  . feeding supplement (PROSource TF)  45 mL Per Tube Daily  . free water  200 mL Per Tube Q6H  . insulin aspart  0-15 Units Subcutaneous Q4H  . mouth rinse  15 mL Mouth Rinse q12n4p  . methylphenidate  10 mg Per NG tube BID WC  . pantoprazole sodium  40 mg Per Tube BID  . polycarbophil  625 mg Per NG tube Daily  . saccharomyces boulardii  250 mg Per NG tube BID      Intake/Output from previous day: 09/13 0701 - 09/14 0700 In: 65 [NG/GT:65] Out: 800 [Urine:800] Intake/Output this shift: No intake/output data recorded.   Lab Results: Recent Labs    01/25/20 0641 01/26/20 0020 01/26/20 0404  WBC 9.2 17.3* 18.4*  HGB 12.0 9.8* 9.8*  HCT 37.7 30.9* 30.3*  PLT 498* 524* 548*   BMET Recent Labs    01/25/20 0641  NA 135  K 4.4  CL 102  CO2 23  GLUCOSE 122*  BUN 18  CREATININE 0.43*  CALCIUM 9.3   LFT No results for input(s): PROT, ALBUMIN, AST, ALT, ALKPHOS, BILITOT, BILIDIR, IBILI in the last 72 hours. PT/INR No results for input(s): LABPROT, INR in the last 72 hours. Hepatitis Panel No results for input(s): HEPBSAG, HCVAB, HEPAIGM, HEPBIGM in the last 72 hours.   . CBC Latest Ref Rng & Units 01/26/2020 01/26/2020 01/25/2020  WBC 4.0 - 10.5 K/uL 18.4(H) 17.3(H) 9.2  Hemoglobin 12.0 - 15.0 g/dL 9.8(L) 9.8(L) 12.0  Hematocrit 36 - 46 % 30.3(L) 30.9(L) 37.7  Platelets 150 - 400 K/uL 548(H) 524(H) 498(H)    . CMP Latest Ref Rng & Units 01/25/2020 01/22/2020 01/21/2020  Glucose 70 - 99 mg/dL 122(H) 109(H) 139(H)  BUN 8 - 23 mg/dL 18 33(H) 33(H)  Creatinine 0.44 - 1.00 mg/dL 0.43(L) 0.40(L) 0.51  Sodium 135 - 145 mmol/L 135 140 142  Potassium 3.5 - 5.1 mmol/L 4.4 4.3 4.2  Chloride 98 - 111 mmol/L 102 104 108  CO2 22 - 32 mmol/L 23 24 23   Calcium 8.9 - 10.3  mg/dL 9.3 9.2 9.6  Total Protein 6.5 - 8.1 g/dL - - 7.3  Total Bilirubin 0.3 - 1.2 mg/dL - - 0.4  Alkaline Phos 38 - 126 U/L - - 95  AST 15 - 41 U/L - - 32  ALT 0 - 44 U/L - - 56(H)   Studies/Results: DG Chest 2 View  Result Date: 01/26/2020 CLINICAL DATA:  Cough EXAM: CHEST - 2 VIEW COMPARISON:  01/14/2020 FINDINGS: Heart and mediastinal contours are within normal limits. No focal opacities or effusions. No acute bony abnormality. IMPRESSION: No active cardiopulmonary disease. Electronically Signed   By: Rolm Baptise M.D.   On: 01/26/2020 10:37    Principal Problem:   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[place]) Active Problems:   Elevated BUN   Prediabetes   Incontinence of feces    Tye Savoy, NP-C @  01/26/2020, 11:17 AM

## 2020-01-26 NOTE — Plan of Care (Signed)
  Problem: Consults Goal: RH BRAIN INJURY PATIENT EDUCATION Description: Description: See Patient Education module for eduction specifics Outcome: Progressing Goal: Skin Care Protocol Initiated - if Braden Score 18 or less Description: If consults are not indicated, leave blank or document N/A Outcome: Progressing Goal: Nutrition Consult-if indicated Outcome: Progressing Goal: Diabetes Guidelines if Diabetic/Glucose > 140 Description: If diabetic or lab glucose is > 140 mg/dl - Initiate Diabetes/Hyperglycemia Guidelines & Document Interventions  Outcome: Progressing   Problem: RH BOWEL ELIMINATION Goal: RH STG MANAGE BOWEL WITH ASSISTANCE Description: STG Manage Bowel with min Assistance. Outcome: Progressing Goal: RH STG MANAGE BOWEL W/MEDICATION W/ASSISTANCE Description: STG Manage Bowel with Medication with min Assistance. Outcome: Progressing   Problem: RH BLADDER ELIMINATION Goal: RH STG MANAGE BLADDER WITH ASSISTANCE Description: STG Manage Bladder With min Assistance Outcome: Progressing Goal: RH STG MANAGE BLADDER WITH MEDICATION WITH ASSISTANCE Description: STG Manage Bladder With Medication With min Assistance. Outcome: Progressing   Problem: RH SKIN INTEGRITY Goal: RH STG SKIN FREE OF INFECTION/BREAKDOWN Description: Skin to remain free from infection and breakdown with min assist while on CIR. Outcome: Progressing Goal: RH STG MAINTAIN SKIN INTEGRITY WITH ASSISTANCE Description: STG Maintain Skin Integrity With min Assistance. Outcome: Progressing Goal: RH STG ABLE TO PERFORM INCISION/WOUND CARE W/ASSISTANCE Description: STG Able To Perform Incision/Wound Care With min Assistance. Outcome: Progressing   Problem: RH SAFETY Goal: RH STG ADHERE TO SAFETY PRECAUTIONS W/ASSISTANCE/DEVICE Description: STG Adhere to Safety Precautions With min Assistance and appropriate assistive Device. Outcome: Progressing   Problem: RH COGNITION-NURSING Goal: RH STG USES MEMORY  AIDS/STRATEGIES W/ASSIST TO PROBLEM SOLVE Description: STG Uses Memory Aids/Strategies With min Assistance to Problem Solve. Outcome: Progressing Goal: RH STG ANTICIPATES NEEDS/CALLS FOR ASSIST W/ASSIST/CUES Description: STG Anticipates Needs/Calls for Assist With min Assistance/Cues. Outcome: Progressing   Problem: RH PAIN MANAGEMENT Goal: RH STG PAIN MANAGED AT OR BELOW PT'S PAIN GOAL Description: <3 on a 0-10 pain scale. Outcome: Progressing   Problem: RH KNOWLEDGE DEFICIT BRAIN INJURY Goal: RH STG INCREASE KNOWLEDGE OF SELF CARE AFTER BRAIN INJURY Description: Patient will demonstrate knowledge of medication management, diabetes management, dietary management, and follow up care with the MD post discharge with min assist from [facility] staff. Outcome: Progressing Goal: RH STG INCREASE KNOWLEDGE OF DYSPHAGIA/FLUID INTAKE Description: Patient will be able to demonstrate knowledge of dysphagia diets, thickened liquids, and swallowing precautions with min assist from rehab staff. Outcome: Progressing   Problem: RH Vision Goal: RH LTG Vision (Specify) Outcome: Progressing

## 2020-01-26 NOTE — Progress Notes (Signed)
Patient ID: Erika Stanton, female   DOB: [birth year], 64 y.o.   MRN: [id number]   SW met with pt husband to provide updates from team conference, and d/c date 10/12. Pt husband is worried about pt, and concerned on what her level of care may be at time of discharge, and if he is able to provide care as he is unable to lift her. SW provided him with SNF list in the event this is needed.  Loralee Pacas, MSW, [place] Office: [phone] Cell: [phone] Fax: [phone]

## 2020-01-26 NOTE — Progress Notes (Signed)
On-call provider called back due to elevated WBC. Verbal order placed for Procalcitonin order set, UA and UC.

## 2020-01-26 NOTE — Progress Notes (Signed)
On-call provider notified of lab results. Verbal order for CBC @0500  placed. To notify provider if any more episodes of black stool occur.

## 2020-01-26 NOTE — Progress Notes (Signed)
[facility] to follow up for input. ASA/Plavix put on hold this am. .

## 2020-01-26 NOTE — Progress Notes (Signed)
Speech Language Pathology Daily Session Note  Patient Details  Name: Erika Stanton MRN: [id number] Date of Birth: Mar 25, 1956  Today's Date: 01/26/2020 SLP Individual Time: 0802-2336 SLP Individual Time Calculation (min): 55 min  Short Term Goals: Week 1: SLP Short Term Goal 1 (Week 1): Patient will increase speech intelligibility to ~50% at the phrase level with Max A multimodal cues for use of speech intelligibility strategies. SLP Short Term Goal 2 (Week 1): Patient will consume trials of ice chips with minimal overt s/s of aspiration over 2 sessions with Mod A vebral cues to assess readiness for repeat MBS SLP Short Term Goal 3 (Week 1): Patient will attend to a functional task for ~5 minutes with Max A multimodal cues.  Skilled Therapeutic Interventions: Skilled treatment session focused on dysphagia and speech goals. Upon arrival, patient had a large incontinent episode of liquid tar colored stool. Patient able to follow directions during peri care and clothing management with extra time and overall Min A verbal cues. Patient with increased awareness of head positioning throughout session but continues to require Max-Total A to self-correct. Patient more agreeable to trials of ice chips but only wanted "small ones." Patient consumed trials without overt s/s of aspiration but patient is not ready for MBS due to current cognitive deficits and poor postioning for PO intake. Patient with increased speech intelligibility at the phrase level and was overall ~75% intelligible with Mod-Max A verbal cues for use of speech intelligibility strategies. Patient requested to use the phone to call her husband. Patient able to request a back scratcher from home while on speaker phone. Patient left upright in bed with transport present to take to X-ray. Continue with current plan of care.      Pain Pain Assessment Pain Scale: 0-10 Pain Score: 4  Faces Pain Scale: Hurts little more Pain Type: Acute  pain Pain Location: Other (Comment) (body general) Pain Orientation: Right;Left Pain Descriptors / Indicators: Aching Pain Onset: On-going Patients Stated Pain Goal: 1 Pain Intervention(s): Medication (See eMAR) Multiple Pain Sites: No  Therapy/Group: Individual Therapy  Erika Stanton, [place] 01/26/2020, 10:18 AM

## 2020-01-26 NOTE — Plan of Care (Signed)
  Problem: RH BOWEL ELIMINATION Goal: RH STG MANAGE BOWEL WITH ASSISTANCE Description: STG Manage Bowel with min Assistance. Outcome: Progressing Goal: RH STG MANAGE BOWEL W/MEDICATION W/ASSISTANCE Description: STG Manage Bowel with Medication with min Assistance. Outcome: Progressing   Problem: RH BOWEL ELIMINATION Goal: RH STG MANAGE BOWEL W/MEDICATION W/ASSISTANCE Description: STG Manage Bowel with Medication with min Assistance. Outcome: Progressing   Problem: RH BLADDER ELIMINATION Goal: RH STG MANAGE BLADDER WITH ASSISTANCE Description: STG Manage Bladder With min Assistance Outcome: Progressing Goal: RH STG MANAGE BLADDER WITH MEDICATION WITH ASSISTANCE Description: STG Manage Bladder With Medication With min Assistance. Outcome: Progressing   Problem: RH SKIN INTEGRITY Goal: RH STG SKIN FREE OF INFECTION/BREAKDOWN Description: Skin to remain free from infection and breakdown with min assist while on CIR. Outcome: Progressing Goal: RH STG MAINTAIN SKIN INTEGRITY WITH ASSISTANCE Description: STG Maintain Skin Integrity With min Assistance. Outcome: Progressing Goal: RH STG ABLE TO PERFORM INCISION/WOUND CARE W/ASSISTANCE Description: STG Able To Perform Incision/Wound Care With min Assistance. Outcome: Progressing   Problem: RH SKIN INTEGRITY Goal: RH STG MAINTAIN SKIN INTEGRITY WITH ASSISTANCE Description: STG Maintain Skin Integrity With min Assistance. Outcome: Progressing

## 2020-01-26 NOTE — Progress Notes (Signed)
Pt had a nosebleed from left nostril, pt currently has cortrak in right nostril. When pt was turned, pt had black tarry smear on brief and rectum. On-call provider notified. Order placed for CBC to be obtained. To notify provider if abnormal lab value or if pt has another instance of black stool.

## 2020-01-27 ENCOUNTER — Inpatient Hospital Stay (HOSPITAL_COMMUNITY): Payer: BC Managed Care – PPO | Admitting: Speech Pathology

## 2020-01-27 ENCOUNTER — Inpatient Hospital Stay (HOSPITAL_COMMUNITY): Payer: BC Managed Care – PPO | Admitting: Physical Therapy

## 2020-01-27 ENCOUNTER — Inpatient Hospital Stay (HOSPITAL_COMMUNITY): Payer: BC Managed Care – PPO | Admitting: Certified Registered"

## 2020-01-27 ENCOUNTER — Encounter (HOSPITAL_COMMUNITY): Payer: Self-pay | Admitting: Physical Medicine & Rehabilitation

## 2020-01-27 ENCOUNTER — Inpatient Hospital Stay (HOSPITAL_COMMUNITY): Payer: BC Managed Care – PPO | Admitting: Occupational Therapy

## 2020-01-27 ENCOUNTER — Encounter (HOSPITAL_COMMUNITY)
Admission: RE | Disposition: A | Discharge status: Home Health | Payer: Self-pay | Source: Intra-hospital | Attending: Physical Medicine & Rehabilitation

## 2020-01-27 DIAGNOSIS — K922 Gastrointestinal hemorrhage, unspecified: Secondary | ICD-10-CM

## 2020-01-27 DIAGNOSIS — K2100 Gastro-esophageal reflux disease with esophagitis, without bleeding: Secondary | ICD-10-CM

## 2020-01-27 DIAGNOSIS — K921 Melena: Secondary | ICD-10-CM

## 2020-01-27 HISTORY — PX: ESOPHAGOGASTRODUODENOSCOPY (EGD) WITH PROPOFOL: SHX5813

## 2020-01-27 LAB — COMPREHENSIVE METABOLIC PANEL
ALT: 49 U/L — ABNORMAL HIGH (ref 0–44)
AST: 35 U/L (ref 15–41)
Albumin: 2.6 g/dL — ABNORMAL LOW (ref 3.5–5.0)
Alkaline Phosphatase: 57 U/L (ref 38–126)
Anion gap: 6 (ref 5–15)
BUN: 23 mg/dL (ref 8–23)
CO2: 25 mmol/L (ref 22–32)
Calcium: 8.5 mg/dL — ABNORMAL LOW (ref 8.9–10.3)
Chloride: 104 mmol/L (ref 98–111)
Creatinine, Ser: 0.46 mg/dL (ref 0.44–1.00)
GFR calc Af Amer: >60 mL/min (ref >60)
GFR calc non Af Amer: >60 mL/min (ref >60)
Glucose, Bld: 140 mg/dL — ABNORMAL HIGH (ref 70–99)
Potassium: 4.3 mmol/L (ref 3.5–5.1)
Sodium: 135 mmol/L (ref 135–145)
Total Bilirubin: 0.3 mg/dL (ref 0.3–1.2)
Total Protein: 5.5 g/dL — ABNORMAL LOW (ref 6.5–8.1)

## 2020-01-27 LAB — GLUCOSE, CAPILLARY
Glucose-Capillary: 102 mg/dL — ABNORMAL HIGH (ref 70–99)
Glucose-Capillary: 127 mg/dL — ABNORMAL HIGH (ref 70–99)
Glucose-Capillary: 81 mg/dL (ref 70–99)
Glucose-Capillary: 81 mg/dL (ref 70–99)
Glucose-Capillary: 83 mg/dL (ref 70–99)
Glucose-Capillary: 96 mg/dL (ref 70–99)
Glucose-Capillary: 96 mg/dL (ref 70–99)

## 2020-01-27 LAB — ABO/RH: ABO/RH(D): A POS

## 2020-01-27 LAB — CBC
HCT: 24.9 % — ABNORMAL LOW (ref 36.0–46.0)
Hemoglobin: 7.9 g/dL — ABNORMAL LOW (ref 12.0–15.0)
MCH: 30.9 pg (ref 26.0–34.0)
MCHC: 31.7 g/dL (ref 30.0–36.0)
MCV: 97.3 fL (ref 80.0–100.0)
Platelets: 452 10*3/uL — ABNORMAL HIGH (ref 150–400)
RBC: 2.56 MIL/uL — ABNORMAL LOW (ref 3.87–5.11)
RDW: 12.9 % (ref 11.5–15.5)
WBC: 13.9 10*3/uL — ABNORMAL HIGH (ref 4.0–10.5)
nRBC: 0.6 % — ABNORMAL HIGH (ref 0.0–0.2)

## 2020-01-27 LAB — URINE CULTURE: Culture: 60000 — AB

## 2020-01-27 LAB — PREPARE RBC (CROSSMATCH)

## 2020-01-27 SURGERY — ESOPHAGOGASTRODUODENOSCOPY (EGD) WITH PROPOFOL
Anesthesia: Monitor Anesthesia Care

## 2020-01-27 MED ORDER — VITAL 1.5 CAL PO LIQD
1000.0000 mL | ORAL | Status: DC
  Filled 2020-01-27 (×2): qty 1000

## 2020-01-27 MED ORDER — DEXTROSE-NACL 5-0.45 % IV SOLN: INTRAVENOUS | Status: DC | PRN

## 2020-01-27 MED ORDER — SODIUM CHLORIDE 0.45 % IV SOLN: INTRAVENOUS | Status: DC

## 2020-01-27 MED ORDER — FUROSEMIDE 10 MG/ML IJ SOLN
20.0000 mg | Freq: Once | INTRAMUSCULAR | Status: AC
  Administered 2020-01-27: 20 mg via INTRAVENOUS
  Filled 2020-01-27: qty 2

## 2020-01-27 MED ORDER — PROPOFOL 500 MG/50ML IV EMUL
INTRAVENOUS | Status: DC | PRN
  Administered 2020-01-27: 100 ug/kg/min via INTRAVENOUS

## 2020-01-27 MED ORDER — SODIUM CHLORIDE 0.9% IV SOLUTION: Freq: Once | INTRAVENOUS | Status: DC

## 2020-01-27 MED ORDER — PROPOFOL 10 MG/ML IV BOLUS
INTRAVENOUS | Status: DC | PRN
  Administered 2020-01-27 (×3): 20 mg via INTRAVENOUS

## 2020-01-27 MED ORDER — LACTATED RINGERS IV SOLN: INTRAVENOUS | Status: DC | PRN

## 2020-01-27 SURGICAL SUPPLY — 15 items

## 2020-01-27 NOTE — Plan of Care (Signed)
Standing goals downgraded 2/2 slow progress, househould gait & stair goals discontinued at this time as they are not applicable 2/2 significant cognitive & physical impairments.  Problem: RH Ambulation Goal: LTG Patient will ambulate in home environment (PT) Description: LTG: Patient will ambulate in home environment, # of feet with assistance (PT). Outcome: Not Applicable Flowsheets (Taken 01/27/2020 0752) LTG: Pt will ambulate in home environ  assist needed:: (n/a at this time 2/2 significant physical & cognitive impairments) -- Note: n/a at this time 2/2 significant physical & cognitive impairments   Problem: RH Stairs Goal: LTG Patient will ambulate up and down stairs w/assist (PT) Description: LTG: Patient will ambulate up and down # of stairs with assistance (PT) Outcome: Not Applicable Flowsheets (Taken 01/27/2020 0752) LTG: Pt will ambulate up/down stairs assist needed:: (n/a at this time 2/2 significant physical & cognitive impairments) -- Note: n/a at this time 2/2 significant physical & cognitive impairments   Problem: RH Balance Goal: LTG Patient will maintain dynamic standing balance (PT) Description: LTG:  Patient will maintain dynamic standing balance with assistance during mobility activities (PT) Flowsheets (Taken 01/27/2020 0752) LTG: Pt will maintain dynamic standing balance during mobility activities with:: (downgrade 2/2 slow progress) Moderate Assistance - Patient 50 - 74% Note: downgrade 2/2 slow progress   Problem: Sit to Stand Goal: LTG:  Patient will perform sit to stand with assistance level (PT) Description: LTG:  Patient will perform sit to stand with assistance level (PT) Flowsheets (Taken 01/27/2020 0752) LTG: PT will perform sit to stand in preparation for functional mobility with assistance level: (downgrade 2/2 slow progress) Moderate Assistance - Patient 50 - 74% Note: downgrade 2/2 slow progress   Problem: RH Car Transfers Goal: LTG Patient will  perform car transfers with assist (PT) Description: LTG: Patient will perform car transfers with assistance (PT). Flowsheets (Taken 01/27/2020 0752) LTG: Pt will perform car transfers with assist:: (downgrade 2/2 slow progress) Moderate Assistance - Patient 50 - 74% Note: downgrade 2/2 slow progress   Problem: RH Ambulation Goal: LTG Patient will ambulate in controlled environment (PT) Description: LTG: Patient will ambulate in a controlled environment, # of feet with assistance (PT). Flowsheets (Taken 01/27/2020 0752) LTG: Pt will ambulate in controlled environ  assist needed:: (downgrade 2/2 slow progress) Moderate Assistance - Patient 50 - 74% LTG: Ambulation distance in controlled environment: 25 ft with LRAD Note: downgrade 2/2 slow progress

## 2020-01-27 NOTE — Plan of Care (Signed)
  Problem: RH BLADDER ELIMINATION Goal: RH STG MANAGE BLADDER WITH ASSISTANCE Description: STG Manage Bladder With min Assistance Outcome: Not Progressing; I and O cath q 6

## 2020-01-27 NOTE — Progress Notes (Signed)
[place] PHYSICAL MEDICINE & REHABILITATION PROGRESS NOTE   Subjective/Complaints: Pt in bed. SLP and husband at bedside. Husband nervous about potential EGD but understands it might need to be done. Pt comfortable, denies pain. She spoke up and said she's had ulcers before!   ROS: Limited due to cognitive/behavioral   Objective:   DG Chest 2 View  Result Date: 01/26/2020 CLINICAL DATA:  Cough EXAM: CHEST - 2 VIEW COMPARISON:  01/14/2020 FINDINGS: Heart and mediastinal contours are within normal limits. No focal opacities or effusions. No acute bony abnormality. IMPRESSION: No active cardiopulmonary disease. Electronically Signed   By: Rolm Baptise M.D.   On: 01/26/2020 10:37   Recent Labs    01/26/20 0404 01/27/20 0439  WBC 18.4* 13.9*  HGB 9.8* 7.9*  HCT 30.3* 24.9*  PLT 548* 452*   Recent Labs    01/25/20 0641 01/27/20 0439  NA 135 135  K 4.4 4.3  CL 102 104  CO2 23 25  GLUCOSE 122* 140*  BUN 18 23  CREATININE 0.43* 0.46  CALCIUM 9.3 8.5*    Intake/Output Summary (Last 24 hours) at 01/27/2020 0843 Last data filed at 01/27/2020 0500 Gross per 24 hour  Intake 660 ml  Output 1000 ml  Net -340 ml     Physical Exam: Vital Signs Blood pressure 108/71, pulse (!) 104, temperature 98.2 F (36.8 C), resp. rate 16, height 5\' 6"  (1.676 m), weight 89.5 kg, SpO2 100 %.  Constitutional: No distress . Vital signs reviewed. HEENT: EOMI, oral membranes moist, NGT Neck: supple Cardiovascular: RRR without murmur. No JVD    Respiratory/Chest: CTA Bilaterally without wheezes or rales. Normal effort    GI/Abdomen: BS +, non-tender, non-distended Ext: no clubbing, cyanosis, or edema Psych: more alert and engaging.  Musc: Lleft SCM tight Neuro: looks to left, fairly alert. Phonating better, answering basic questions appropriately. Provided contextual feedback as above. Follows simple commands. Dysarthric.  Eyes open  Motor: still no movement noted in right upper extremity  Skin: numerous bruises along limbs and abdomen, largely from IV access, sq heparin--stable   Assessment/Plan: 1. 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  Physiatrist is providing close team supervision and 24 hour management of active medical problems listed below.  Physiatrist and rehab team continue to assess barriers to discharge/monitor patient progress toward functional and medical goals  Care Tool:  Bathing        Body parts bathed by helper: Right arm, Left arm, Chest, Abdomen, Front perineal area, Buttocks, Right upper leg, Left upper leg, Right lower leg, Left lower leg, Face     Bathing assist Assist Level: Dependent - Patient 0%     Upper Body Dressing/Undressing Upper body dressing   What is the patient wearing?: Pull over shirt    Upper body assist Assist Level: Dependent - Patient 0%    Lower Body Dressing/Undressing Lower body dressing      What is the patient wearing?: Pants     Lower body assist Assist for lower body dressing: Maximal Assistance - Patient 25 - 49%     Toileting Toileting    Toileting assist Assist for toileting: Dependent - Patient 0%     Transfers Chair/bed transfer  Transfers assist     Chair/bed transfer assist level: 2 Helpers     Locomotion Ambulation   Ambulation assist      Assist level: 2 helpers Assistive  device: Other (comment) (swedish walker) Max distance: 10   Walk 10 feet activity   Assist     Assist level: 2 helpers Assistive device: Other (comment) (swedish walker)   Walk 50 feet activity   Assist Walk 50 feet with 2 turns activity did not occur: Safety/medical concerns         Walk 150 feet activity   Assist Walk 150 feet activity did not occur: Safety/medical concerns         Walk 10 feet on uneven surface  activity   Assist Walk 10  feet on uneven surfaces activity did not occur: Safety/medical concerns         Wheelchair     Assist Will patient use wheelchair at discharge?: Yes Type of Wheelchair: Manual           Wheelchair 50 feet with 2 turns activity    Assist    Wheelchair 50 feet with 2 turns activity did not occur: Safety/medical concerns (requires TIS for postural support)       Wheelchair 150 feet activity     Assist  Wheelchair 150 feet activity did not occur: Safety/medical concerns       Blood pressure 108/71, pulse (!) 104, temperature 98.2 F (36.8 C), resp. rate 16, height 5\' 6"  (1.676 m), weight 89.5 kg, SpO2 100 %.  Medical Problem List and Plan: 1.  Right hemiparesis, dysphagia, cognitive deficits with disorientation, unintelligible speech lethargy secondary to bilateral intracranial hemorrhages with subsequent infarcts.  Continue CIR, team conference today 2.  Antithrombotics: -DVT/anticoagulation:  Pharmaceutical: Heparin--hold                          Dopplers negative for DVT.              -antiplatelet therapy: DAPT--hold plavix, asa for now  -spoke to husband about plan today 3. Pain Management: Tylenol prn.  Appears to be well controlled.  4. Mood: LCSW to follow for evaluation and support when appropriate.              -antipsychotic agents: N/A 5. Neuropsych: This patient is not capable of making decisions on her own behalf.  Continue trial of ritalin for arousal/stimulation--increased to 10mg  9/13 6. Skin/Wound Care: routine pressure relief measures.  7. Fluids/Electrolytes/Nutrition: NPO with Jevity TF- increased to 70 cc/hr so can be held 4 hours for therapy.  .   Increased H2O flushes  -BUN sl elevated. Holding TF for potential EGD. run IVF today 50cc/hr 8. ABLA/UGIB:     Liquid black stool overnight. hgb drop from 12.0 to 9.8 this morning.   -hold plavix/asa for now. Stop sq heparin   -check stool for OB  -serial CBC's  -protonix gtt  -GI consult  appreciated!  -further Hgb drop today----> transfuse 2u PRBC  -EGD potentially today?  -pt appears comfortable, stable 9. Thrombocytopenia:   Monitor for signs of bleeding.  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 Oral care qid. 11. Resting tachycardia:              has been ranging from 90's to 100's 12. Vasospasm prevention: To continue Nimotop for 3 additional days.  13. Bowel incontinence/loose stool: multifactorial, on abx for uti also  -continue fiber/probiotic  -stools still loose  -9/13 added cholestyramine TID  -9/14 see #8 14. Proteus UTI: Ceftriaxone course completed  -f/u ua negative 18.  Prediabetes, confounded by tube feeds  Relatively controlled on 9/13  Continue to monitor 19.  Leukoctyosis: improved today to 13k, afebrile  -UA negative , procalcitonin wnl  -  cxr clear  -might be reactive related to GIB  -treat supportively, serial labs   LOS: 7 days A FACE TO FACE EVALUATION WAS PERFORMED  Meredith Staggers 01/27/2020, 8:43 AM

## 2020-01-27 NOTE — Anesthesia Procedure Notes (Signed)
Procedure Name: MAC Date/Time: 01/27/2020 3:57 PM Performed by: Barrington Ellison, CRNA Pre-anesthesia Checklist: Patient identified, Emergency Drugs available, Suction available, Patient being monitored and Timeout performed Patient Re-evaluated:Patient Re-evaluated prior to induction Oxygen Delivery Method: Nasal cannula

## 2020-01-27 NOTE — Progress Notes (Signed)
Speech Language Pathology Daily Session Note  Patient Details  Name: Erika Stanton MRN: [id number] Date of Birth: [birth year]  Today's Date: 01/27/2020 SLP Individual Time: 0700-0800 SLP Individual Time Calculation (min): 60 min  Short Term Goals: Week 1: SLP Short Term Goal 1 (Week 1): Patient will increase speech intelligibility to ~50% at the phrase level with Max A multimodal cues for use of speech intelligibility strategies. SLP Short Term Goal 2 (Week 1): Patient will consume trials of ice chips with minimal overt s/s of aspiration over 2 sessions with Mod A vebral cues to assess readiness for repeat MBS SLP Short Term Goal 3 (Week 1): Patient will attend to a functional task for ~5 minutes with Max A multimodal cues.  Skilled Therapeutic Interventions: Skilled treatment session focused on cognitive and speech goals. Trials were not attempted today due to being NPO for a procedure. Patient's husband present and appeared frustrated due to his perceived lack of communication amongst medical staff to him regarding patient's current plan of medical care. SLP provided support and made the PA and RN aware. Patient with increased speech intelligibility at the word and phrase level. Patient required overall Mod A verbal cues for use of speech intelligibility strategies during spontaneous speech at the phrase level to achieve ~80% intelligibility. Min A verbal cues were needed for use of strategies at the word level during a barrier task. Max-Total A multimodal cues were needed to bring head to midline for visual scanning. Patient left upright in bed with alarm on and all needs within reach. Continue with current plan of care.      Pain Pain Assessment Pain Scale: 0-10 Faces Pain Scale: Hurts whole lot Pain Type: Acute pain Pain Descriptors / Indicators: Aching Pain Onset: On-going Pain Intervention(s): Medication (See eMAR)  Therapy/Group: Individual Therapy  Nichola Cieslinski 01/27/2020,  12:52 PM

## 2020-01-27 NOTE — Progress Notes (Signed)
Received a call from [facility] reporting Ms. Erika Stanton was out, note from Erika Smoker RN stated the Cortrak came out during Endoscopy. Medications reviewed and notes reviewed. Will have new Cortrak placed in the morning, check blood sugar every 4 hours and continue to monitor. Erika Stanton verbalizes understanding.

## 2020-01-27 NOTE — Anesthesia Postprocedure Evaluation (Signed)
Anesthesia Post Note  Patient: Erika Stanton  Procedure(s) Performed: ESOPHAGOGASTRODUODENOSCOPY (EGD) WITH PROPOFOL (N/A )     Patient location during evaluation: PACU Anesthesia Type: MAC Level of consciousness: awake and alert Pain management: pain level controlled Vital Signs Assessment: post-procedure vital signs reviewed and stable Respiratory status: spontaneous breathing, nonlabored ventilation, respiratory function stable and patient connected to nasal cannula oxygen Cardiovascular status: stable and blood pressure returned to baseline Postop Assessment: no apparent nausea or vomiting Anesthetic complications: no   No complications documented.  Last Vitals:  Vitals:   01/27/20 1640 01/27/20 1650  BP: 119/74 125/67  Pulse: 89 95  Resp: 17 19  Temp:    SpO2: 99% 99%    Last Pain:  Vitals:   01/27/20 1650  TempSrc:   PainSc: 0-No pain                 Belenda Cruise P Darcus Edds

## 2020-01-27 NOTE — Progress Notes (Signed)
Physical Therapy Session Note  Patient Details  Name: Erika Stanton MRN: [id number] Date of Birth: 1956-01-11  Today's Date: 01/27/2020 PT Individual Time: 3546-5681  PT Individual Time Calculation (min): 68 min   Short Term Goals: Week 2:  PT Short Term Goal 1 (Week 2): Pt will complete bed<>w/c with max assist +1 consistently. PT Short Term Goal 2 (Week 2): Pt will demonstrate static sitting balance x 5 minutes with min assist. PT Short Term Goal 3 (Week 2): Pt will consistently complete bed mobility with max assist +1.  Skilled Therapeutic Interventions/Progress Updates:  Treatment 1: Pt received in bed with husband present in room. Pt requires total assist for donning pants at bed level, with up to max assist for rolling & max assist supine>sit. Pt sits EOB ~5 minutes with max assist with assist & encouragement/cuing to attempt to lift head with pt somewhat attempting. Pt requires encouragement from PT & husband for OOB activity and when about to position slide board pt reports "I can stand". Pt completes stand pivot bed>w/c with max assist +2 but pt participating in coming to stand & repositioning in w/c. PT does provide max assist for blocking knees, assisting with pivoting. Once in w/c PT placed lap tray & provided towels to assist with positioning in w/c as pt continues to lean L. Pt utilizes kinetron from w/c level 30 seconds + 1 minute + 1 minute with max encouragement and cuing to continue task but with active movement in BLE with task focusing on strengthening & NMR. Pt asks to wipe nose & PT encourages pt to do so with pt requiring max assist for placing cloth in hand. Attempted to have pt engage in tabletop task of grasping & moving blocks but pt with little to no engagement, repeatedly stating she needed to go back to bed - husband made aware of poor motivation. Throughout session PT performed R cervical rotation stretch within comfort zone but pt continues to turn to L. At end of  session pt left in w/c with seat belt & half lap tray donned, husband present in room. Pain: pt with various c/o pain throughout session with positioning & palpation with repositioning provided PRN for increased comfort. Pt also c/o unrated HA & nurse made aware & administered meds at beginning of session.  Treatment 2: At time of scheduled PT appointment, pt off unit for medical procedure. Pt missed 30 minutes skilled PT treatment, will f/u per POC.   Therapy Documentation Precautions:  Precautions Precautions: Fall Precaution Comments: NPO Restrictions Weight Bearing Restrictions: No   General: PT Amount of Missed Time (min): 30 Minutes PT Missed Treatment Reason:  (medical procedure)   Therapy/Group: Individual Therapy  [name] 01/27/2020, 4:00 PM

## 2020-01-27 NOTE — Progress Notes (Addendum)
Daily Rounding Note  01/27/2020, 8:39 AM  LOS: 7 days   SUBJECTIVE:   Chief complaint: melena, anemia   No melena overnight.  No nausea or abd pain Hgb 7.9, rehab MD ordered PRBC x 1 this AM, not yet transfused.    OBJECTIVE:         Vital signs in last 24 hours:    Temp:  [98.1 F (36.7 C)-98.4 F (36.9 C)] 98.2 F (36.8 C) (09/15 0402) Pulse Rate:  [104-109] 104 (09/15 0402) Resp:  [16-20] 16 (09/15 0402) BP: (108-116)/(71-78) 108/71 (09/15 0402) SpO2:  [100 %] 100 % (09/15 0402) Last BM Date: 01/26/20 Filed Weights   01/20/20 1724  Weight: 89.5 kg   General: pale, ill.  coretrack in nose   Heart: RRR Chest: clear bil.  No labored resps.  Soft voice Abdomen: soft, NT, ND.  Active BS Extremities: no CCE Neuro/Psych:  Follows commands.  Alert.    Intake/Output from previous day: 09/14 0701 - 09/15 0700 In: 660 [NG/GT:660] Out: 1000 [Urine:1000]  Intake/Output this shift: No intake/output data recorded.  Lab Results: Recent Labs    01/26/20 0020 01/26/20 0404 01/27/20 0439  WBC 17.3* 18.4* 13.9*  HGB 9.8* 9.8* 7.9*  HCT 30.9* 30.3* 24.9*  PLT 524* 548* 452*   BMET Recent Labs    01/25/20 0641 01/27/20 0439  NA 135 135  K 4.4 4.3  CL 102 104  CO2 23 25  GLUCOSE 122* 140*  BUN 18 23  CREATININE 0.43* 0.46  CALCIUM 9.3 8.5*   LFT Recent Labs    01/27/20 0439  PROT 5.5*  ALBUMIN 2.6*  AST 35  ALT 49*  ALKPHOS 57  BILITOT 0.3   PT/INR No results for input(s): LABPROT, INR in the last 72 hours. Hepatitis Panel No results for input(s): HEPBSAG, HCVAB, HEPAIGM, HEPBIGM in the last 72 hours.  Studies/Results: DG Chest 2 View  Result Date: 01/26/2020 CLINICAL DATA:  Cough EXAM: CHEST - 2 VIEW COMPARISON:  01/14/2020 FINDINGS: Heart and mediastinal contours are within normal limits. No focal opacities or effusions. No acute bony abnormality. IMPRESSION: No active cardiopulmonary  disease. Electronically Signed   By: Rolm Baptise M.D.   On: 01/26/2020 10:37    Scheduled Meds: . sodium chloride   Intravenous Once  . [START ON 01/29/2020] aspirin  325 mg Per Tube Daily  . chlorhexidine  15 mL Mouth Rinse BID  . Chlorhexidine Gluconate Cloth  6 each Topical Daily  . cholestyramine  4 g Per Tube TID  . [START ON 01/29/2020] clopidogrel  75 mg Per Tube Daily  . feeding supplement (PROSource TF)  45 mL Per Tube Daily  . free water  200 mL Per Tube Q6H  . insulin aspart  0-15 Units Subcutaneous Q4H  . mouth rinse  15 mL Mouth Rinse q12n4p  . methylphenidate  10 mg Per NG tube BID WC  . [START ON 01/30/2020] pantoprazole  40 mg Intravenous Q12H  . polycarbophil  625 mg Per NG tube Daily  . saccharomyces boulardii  250 mg Per NG tube BID  . sodium chloride flush  10-40 mL Intracatheter Q12H   Continuous Infusions: . feeding supplement (VITAL 1.5 CAL)    . pantoprozole (PROTONIX) infusion 8 mg/hr (01/27/20 0416)   PRN Meds:.acetaminophen, alum & mag hydroxide-simeth, bisacodyl, diphenhydrAMINE, Gerhardt's butt cream, guaiFENesin-dextromethorphan, prochlorperazine **OR** prochlorperazine **OR** prochlorperazine, sodium chloride flush, traZODone   ASSESMENT:   *    GIB  w melena. Elevated BUN improved 33 >> 23.   Was getting Protonix 40 po bid, now on Protonix gtt as of 9/14.    *    Blood loss anemia.  Hgb 12 >> 7.9 in last 2 d.  No PRBCs to date.   1 PRBC ordered by Dr Naaman Plummer.    *   ICH w bil infarcts.  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  meds on hold for now.  Last Plavix 9/14.    *   Adenomatous colon polyps, hemorrhoids 2016.     PLAN   *   EGD today at 3:15.  NPO for this, ok for meds VT.      Azucena Freed  01/27/2020, 8:39 AM Phone [phone]   Attending physician's note   I have taken an interval history, reviewed the chart and examined the patient. I agree with the Advanced Practitioner's note, impression and recommendations.     Admitted with melena and anemia concerning for upper GI bleed On dual antiplatelet therapy, last dose of Plavix on 9/14  Plan to proceed with EGD for further evaluation  The risks and benefits as well as alternatives of endoscopic procedure(s) have been discussed and reviewed. All questions answered. The patient agrees to proceed.   The patient agreed with the plan and demonstrated an understanding of the instructions.  Damaris Hippo , MD [phone]

## 2020-01-27 NOTE — Progress Notes (Signed)
Occupational Therapy Session Note  Patient Details  Name: Erika Stanton MRN: [id number] Date of Birth: 06-May-1956  Today's Date: 01/27/2020 OT Individual Time: 5883-2549 OT Individual Time Calculation (min): 44 min    Short Term Goals: Week 1:  OT Short Term Goal 1 (Week 1): patient will roll in bed with min A, move from side lying to sitting edge of bed with mod A of one OT Short Term Goal 2 (Week 1): patient will tolerate unsupported sitting with min A for 10 minutes with head midline OT Short Term Goal 3 (Week 1): patient will use left UE to wash 25% of upper body and assist with donning OH shirt max A OT Short Term Goal 4 (Week 1): patient will scan environment both right and left to locate items with moderate cues  Skilled Therapeutic Interventions/Progress Updates:  Patient met lying supine in bed in agreement with OT treatment session. RN present for first ~8 minutes of session get vital signs and administer medication. Patient able to roll to R with +2 assist A in prep for gentle cervical ROM and stretching to promote postureal control. In R side-lying position, patient engaged in activity with focus on tracking in all 4 quadrants of visual field with patient often losing target and inconsisent attention to either side with poor ability track across midline. Session concluded with patient lying on R side with pillows at back, between legs, and between BUE, bed alarm activated, and all needs met.   Therapy Documentation Precautions:  Precautions Precautions: Fall Precaution Comments: NPO Restrictions Weight Bearing Restrictions: No General:    Therapy/Group: Individual Therapy  Jackqueline Aquilar R Howerton-Davis 01/27/2020, 12:49 PM

## 2020-01-27 NOTE — Progress Notes (Signed)
Patient back on the floor. Per report her cortrak came off when they were doing the procedure. CN notified and she messaged Dan PA. Lasix given and second blood transfusion started. Husband in room

## 2020-01-27 NOTE — Anesthesia Preprocedure Evaluation (Addendum)
Anesthesia Evaluation  Patient identified by MRN, date of birth, ID band Patient awake    Reviewed: Patient's Chart, lab work & pertinent test results  Airway Mallampati: III   Neck ROM: Limited  Mouth opening: Limited Mouth Opening  Dental  (+) Teeth Intact   Pulmonary former smoker,    Pulmonary exam normal        Cardiovascular negative cardio ROS   Rhythm:Regular Rate:Normal     Neuro/Psych CVA, Residual Symptoms    GI/Hepatic Neg liver ROS, GIB, Hgb 7.9 at time of eval, 1U PRBC being transfused    Endo/Other  negative endocrine ROS  Renal/GU negative Renal ROS  negative genitourinary   Musculoskeletal negative musculoskeletal ROS (+)   Abdominal Normal abdominal exam  (+)  Abdomen: soft. Bowel sounds: normal.  Peds negative pediatric ROS (+)  Hematology  (+) anemia ,   Anesthesia Other Findings   Reproductive/Obstetrics negative OB ROS                            Anesthesia Physical Anesthesia Plan  ASA: IV  Anesthesia Plan: MAC   Post-op Pain Management:    Induction:   PONV Risk Score and Plan: 2 and Dexamethasone and Ondansetron  Airway Management Planned: Simple Face Mask and Nasal Cannula  Additional Equipment: None  Intra-op Plan:   Post-operative Plan:   Informed Consent: I have reviewed the patients History and Physical, chart, labs and discussed the procedure including the risks, benefits and alternatives for the proposed anesthesia with the patient or authorized representative who has indicated his/her understanding and acceptance.       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 Left anterior frontal/parietal cortical infarct with punctate microemboli of right frontal and parietal regions. Patient with feeding  tube and NPO 2/2 aspiration precautions here for EGD 2/2 GIB.   Lab Results      Component                Value               Date                      WBC                      13.9 (H)            01/27/2020                HGB                      7.9 (L)             01/27/2020                HCT                      24.9 (L)            01/27/2020                MCV                      97.3                01/27/2020  PLT                      452 (H)             01/27/2020           Covid-19 Nucleic Acid Test Results Lab Results      Component                Value               Date                      [id number]              NEGATIVE            01/05/2020          )       Anesthesia Quick Evaluation

## 2020-01-27 NOTE — Progress Notes (Signed)
Physical Therapy Weekly Progress Note  Patient Details  Name: Erika Stanton MRN: [id number] Date of Birth: Apr 03, 1956  Beginning of progress report period: January 21, 2020 End of progress report period: January 27, 2020  Today's Date: 01/27/2020   Patient has met 1 of 5 short term goals.  Pt is making slow progress with functional mobility. Pt currently requires max assist<>+2 for bed mobility, +2 assist for slide board transfers. Pt intermittently participates in rolling & scooting across board, and can answer questions fairly accurately during session. Pt continues to be limited by significant deficits noted below with focus of treatment being on bed mobility, transfers, activity tolerance, positioning, and pt/caregiver education.  Patient continues to demonstrate the following deficits muscle weakness, decreased cardiorespiratoy endurance, decreased coordination and decreased motor planning, decreased visual perceptual skills, decreased midline orientation, decreased attention to right and right side neglect, decreased initiation, decreased attention, decreased awareness, decreased problem solving, decreased safety awareness, decreased memory and delayed processing, and decreased sitting balance, decreased standing balance, decreased postural control and decreased balance strategies and therefore will continue to benefit from skilled PT intervention to increase functional independence with mobility.  Patient is very slowly progressing towards LTGs.  Plan of care revisions: household ambulation & stair goals discontinued at this time as they are not currently applicable 2/2 significant physical & cognitive impairments, standing goals downgraded 2/2 slow progress & significant deficits.  PT Short Term Goals Week 1:  PT Short Term Goal 1 (Week 1): Pt will demonstrate static sit consistently w/min assist PT Short Term Goal 1 - Progress (Week 1): Not met PT Short Term Goal 2 (Week 1): pt will  perform STS w/mod assist of 2 PT Short Term Goal 2 - Progress (Week 1): Not met PT Short Term Goal 3 (Week 1): Pt will ambulate 5f w/LRAD and max assist of 2 PT Short Term Goal 3 - Progress (Week 1): Not met PT Short Term Goal 4 (Week 1): Pt will tolerate passive cervical rotation to R 20* PT Short Term Goal 4 - Progress (Week 1): Not met PT Short Term Goal 5 (Week 1): Pt will tolerate 1-2 hrs OOB in wc PT Short Term Goal 5 - Progress (Week 1): Met Week 2:  PT Short Term Goal 1 (Week 2): Pt will complete bed<>w/c with max assist +1 consistently. PT Short Term Goal 2 (Week 2): Pt will demonstrate static sitting balance x 5 minutes with min assist. PT Short Term Goal 3 (Week 2): Pt will consistently complete bed mobility with max assist +1.    Therapy Documentation Precautions:  Precautions Precautions: Fall Precaution Comments: NPO Restrictions Weight Bearing Restrictions: No   Therapy/Group: Individual Therapy  VWaunita Schooner9/15/2021, 7:58 AM

## 2020-01-27 NOTE — Transfer of Care (Signed)
Immediate Anesthesia Transfer of Care Note  Patient: Erika Stanton  Procedure(s) Performed: ESOPHAGOGASTRODUODENOSCOPY (EGD) WITH PROPOFOL (N/A )  Patient Location: Endoscopy Unit  Anesthesia Type:MAC  Level of Consciousness: drowsy and patient cooperative  Airway & Oxygen Therapy: Patient Spontanous Breathing  Post-op Assessment: Report given to RN  Post vital signs: Reviewed and stable  Last Vitals:  Vitals Value Taken Time  BP 101/69 01/27/20 1629  Temp    Pulse 92 01/27/20 1630  Resp 21 01/27/20 1630  SpO2 98 % 01/27/20 1630  Vitals shown include unvalidated device data.  Last Pain:  Vitals:   01/27/20 1459  TempSrc: Oral  PainSc: 0-No pain      Patients Stated Pain Goal: 1 (70/48/88 9169)  Complications: No complications documented.

## 2020-01-28 ENCOUNTER — Inpatient Hospital Stay (HOSPITAL_COMMUNITY): Payer: BC Managed Care – PPO

## 2020-01-28 ENCOUNTER — Encounter (HOSPITAL_COMMUNITY): Payer: Self-pay | Admitting: Gastroenterology

## 2020-01-28 ENCOUNTER — Inpatient Hospital Stay (HOSPITAL_COMMUNITY): Payer: BC Managed Care – PPO | Admitting: Speech Pathology

## 2020-01-28 ENCOUNTER — Inpatient Hospital Stay (HOSPITAL_COMMUNITY): Payer: BC Managed Care – PPO | Admitting: Occupational Therapy

## 2020-01-28 LAB — CBC
HCT: 33.3 % — ABNORMAL LOW (ref 36.0–46.0)
Hemoglobin: 11.1 g/dL — ABNORMAL LOW (ref 12.0–15.0)
MCH: 31.1 pg (ref 26.0–34.0)
MCHC: 33.3 g/dL (ref 30.0–36.0)
MCV: 93.3 fL (ref 80.0–100.0)
Platelets: 401 10*3/uL — ABNORMAL HIGH (ref 150–400)
RBC: 3.57 MIL/uL — ABNORMAL LOW (ref 3.87–5.11)
RDW: 13.5 % (ref 11.5–15.5)
WBC: 9.4 10*3/uL (ref 4.0–10.5)
nRBC: 0.7 % — ABNORMAL HIGH (ref 0.0–0.2)

## 2020-01-28 LAB — TYPE AND SCREEN
ABO/RH(D): A POS
Antibody Screen: NEGATIVE
Unit division: 0
Unit division: 0

## 2020-01-28 LAB — BASIC METABOLIC PANEL
Anion gap: 10 (ref 5–15)
BUN: 18 mg/dL (ref 8–23)
CO2: 27 mmol/L (ref 22–32)
Calcium: 9.2 mg/dL (ref 8.9–10.3)
Chloride: 99 mmol/L (ref 98–111)
Creatinine, Ser: 0.52 mg/dL (ref 0.44–1.00)
GFR calc Af Amer: >60 mL/min (ref >60)
GFR calc non Af Amer: >60 mL/min (ref >60)
Glucose, Bld: 98 mg/dL (ref 70–99)
Potassium: 3.6 mmol/L (ref 3.5–5.1)
Sodium: 136 mmol/L (ref 135–145)

## 2020-01-28 LAB — BPAM RBC
Blood Product Expiration Date: 202110092359
Blood Product Expiration Date: 202110112359
ISSUE DATE / TIME: 202109151232
ISSUE DATE / TIME: 202109151755
Unit Type and Rh: 6200
Unit Type and Rh: 6200

## 2020-01-28 LAB — GLUCOSE, CAPILLARY
Glucose-Capillary: 86 mg/dL (ref 70–99)
Glucose-Capillary: 95 mg/dL (ref 70–99)
Glucose-Capillary: 86 mg/dL (ref 70–99)
Glucose-Capillary: 83 mg/dL (ref 70–99)
Glucose-Capillary: 96 mg/dL (ref 70–99)

## 2020-01-28 MED ORDER — OSMOLITE 1.5 CAL PO LIQD
1000.0000 mL | ORAL | Status: DC
  Administered 2020-01-29 – 2020-01-31 (×3): 1000 mL
  Filled 2020-01-28 (×4): qty 1000

## 2020-01-28 MED ORDER — PANTOPRAZOLE SODIUM 40 MG PO TBEC: 40.0000 mg | DELAYED_RELEASE_TABLET | Freq: Every day | ORAL | Status: DC

## 2020-01-28 MED ORDER — PROSOURCE TF PO LIQD
45.0000 mL | Freq: Two times a day (BID) | ORAL | Status: DC
  Administered 2020-01-29 – 2020-02-01 (×6): 45 mL
  Filled 2020-01-28 (×6): qty 45

## 2020-01-28 MED ORDER — DEXTROSE-NACL 5-0.45 % IV SOLN: INTRAVENOUS | Status: DC

## 2020-01-28 NOTE — Progress Notes (Signed)
Occupational Therapy Weekly Progress Note  Patient Details  Name: Erika Stanton MRN: [id number] Date of Birth: [birth year]  Beginning of progress report period: January 21, 2020 End of progress report period: January 28, 2020  Today's Date: 01/28/2020 OT Individual Time: 0930-1030 OT Individual Time Calculation (min): 60 min    Patient has met 3 of 4 short term goals.  Pt has made progress this week with her attention, ability to follow directions to roll, sit up in bed, sit at EOB.  She continues to need significant A, cues but there has definitely been some progress this week.  Her main barrier is her postural/ head/ neck control and midline awareness along with a flaccid RUE and severely limited LUE with 2/5 strength and min hypertone. As a result, she continues to require total A with all self care.   Patient continues to demonstrate the following deficits: muscle weakness and muscle joint tightness, decreased cardiorespiratoy endurance, abnormal tone, unbalanced muscle activation and motor apraxia, decreased visual acuity, decreased visual perceptual skills and decreased visual motor skills, decreased midline orientation and decreased attention to right, decreased initiation, decreased attention, decreased awareness, decreased problem solving, decreased safety awareness, decreased memory and delayed processing and decreased sitting balance, decreased standing balance, decreased postural control, hemiplegia and decreased balance strategies and therefore will continue to benefit from skilled OT intervention to enhance overall performance with BADL.  Patient progressing toward long term goals..  Continue plan of care.  OT Short Term Goals Week 1:  OT Short Term Goal 1 (Week 1): patient will roll in bed with min A, move from side lying to sitting edge of bed with mod A of one OT Short Term Goal 1 - Progress (Week 1): Met OT Short Term Goal 2 (Week 1): patient will tolerate unsupported  sitting with min A for 10 minutes with head midline OT Short Term Goal 2 - Progress (Week 1): Met OT Short Term Goal 3 (Week 1): patient will use left UE to wash 25% of upper body and assist with donning OH shirt max A OT Short Term Goal 3 - Progress (Week 1): Progressing toward goal OT Short Term Goal 4 (Week 1): patient will scan environment both right and left to locate items with moderate cues OT Short Term Goal 4 - Progress (Week 1): Met Week 2:  OT Short Term Goal 1 (Week 2): Pt will use L hand with mod A to wash 50 % of her UB. OT Short Term Goal 2 (Week 2): Pt will be able to sit upright on  a BSC with min A. OT Short Term Goal 3 (Week 2): Pt will be able to squat pivot with mod A of 2 to a BSC. OT Short Term Goal 4 (Week 2): Pt will be able to sit to stand with mod A of 2 to increase skills needed for LB dressing.  Skilled Therapeutic Interventions/Progress Updates:    Pt seen this session for self care of LB bed level.  Pt's friend present who wanted to work on her hair once she was up in the chair. So did not place new gown on her as the other one would get wet.  Can not use shirt due to IV lines in B arms.  Pt worked on rolling in bed with min A as old brief removed, pt cleansed and new brief and pants donned.  Sat to EOB mod A with +2 present as pt's balance is very inconsistent.   After pt settled with  cues to focus on posture, she was able to sit for close to 10 min with min A to occasional S.  Worked on visual scanning L to R with min cues.    +2 max slide board to wc.  Set up at sink to allow her friend to start working on her hair.  Belt on.   Therapy Documentation Precautions:  Precautions Precautions: Fall Precaution Comments: NPO Restrictions Weight Bearing Restrictions: No  Pain: Pain Assessment Pain Scale: Faces Pain Score: 0-No pain ADL: ADL Eating: NPO Grooming: Dependent Where Assessed-Grooming: Bed level Upper Body Bathing: Dependent Where  Assessed-Upper Body Bathing: Bed level Lower Body Bathing: Dependent Where Assessed-Lower Body Bathing: Bed level Upper Body Dressing: Dependent Where Assessed-Upper Body Dressing: Edge of bed Lower Body Dressing: Maximal assistance, Dependent Where Assessed-Lower Body Dressing: Bed level Toileting: Dependent Where Assessed-Toileting: Bed level ADL Comments: patient able to hold wash cloth with left hand but unable to manipulate for washing, she is able to lift each leg for donning pants/socks  Therapy/Group: Individual Therapy  Dareen Gutzwiller 01/28/2020, 12:59 PM

## 2020-01-28 NOTE — Op Note (Signed)
[facility] Patient Name: Erika Stanton Procedure Date : 01/27/2020 MRN: [id number] Attending MD: Mauri Pole , MD Date of Birth: 06-18-1955 CSN: [id number] Age: 64 Admit Type: Inpatient Procedure:                Upper GI endoscopy Indications:              Melena, Suspected upper gastrointestinal bleeding,                            Gastrointestinal bleeding of unknown origin Providers:                Mauri Pole, MD, Clyde Lundborg, RN, Laverda Sorenson, Technician, Sampson Si, CRNA Referring MD:              Medicines:                Monitored Anesthesia Care Complications:            No immediate complications. Estimated Blood Loss:     Estimated blood loss: none. Procedure:                Pre-Anesthesia Assessment:                           - Prior to the procedure, a History and Physical                            was performed, and patient medications and                            allergies were reviewed. The patient's tolerance of                            previous anesthesia was also reviewed. The risks                            and benefits of the procedure and the sedation                            options and risks were discussed with the patient.                            All questions were answered, and informed consent                            was obtained. Prior Anticoagulants: The patient has                            taken no previous anticoagulant or antiplatelet                            agents. ASA Grade Assessment: III - A patient with  severe systemic disease. After reviewing the risks                            and benefits, the patient was deemed in                            satisfactory condition to undergo the procedure.                           After obtaining informed consent, the endoscope was                            passed under direct vision. Throughout the                             procedure, the patient's blood pressure, pulse, and                            oxygen saturations were monitored continuously. The                            GIF-H190 ([id number]) Olympus gastroscope was                            introduced through the mouth, and advanced to the                            second part of duodenum. The upper GI endoscopy was                            accomplished without difficulty. The patient                            tolerated the procedure well. Scope In: Scope Out: Findings:      LA Grade B (one or more mucosal breaks greater than 5 mm, not extending       between the tops of two mucosal folds) esophagitis with no bleeding was       found 34 to 35 cm from the incisors.      A small hiatal hernia was present.      Post pyloric feeding tube. The examined duodenum was normal.      The stomach was normal.      The cardia and gastric fundus were normal on retroflexion. Impression:               - LA Grade B reflux esophagitis with no bleeding.                           - Small hiatal hernia.                           - Normal examined duodenum.                           - Normal stomach.                           -  No specimens collected.                           - No source for possible upper GI hemorrhage Recommendation:           - Will need replacement of post pyloric feeding tube                           - Resume previous diet.                           - Continue present medications.                           - Continue Aspirin                           - Hold Plavix                           - Monitor Hgb and transfuse as needed                           - If has evidence of ongoing GI blood loss, will                            consider colonoscopy +/- VCE for further evaluation                           - Called her husband and discussed the findings of                            EGD and further management  plan. Procedure Code(s):        --- Professional ---                           [id number], Esophagogastroduodenoscopy, flexible,                            transoral; diagnostic, including collection of                            specimen(s) by brushing or washing, when performed                            (separate procedure) Diagnosis Code(s):        --- Professional ---                           K21.00, Gastro-esophageal reflux disease with                            esophagitis, without bleeding                           K44.9, Diaphragmatic hernia without obstruction or  gangrene                           K92.2, Gastrointestinal hemorrhage, unspecified CPT copyright 2019 [employer]. All rights reserved. The codes documented in this report are preliminary and upon coder review may  be revised to meet current compliance requirements. Mauri Pole, MD 01/28/2020 11:25:09 AM This report has been signed electronically. Number of Addenda: 0

## 2020-01-28 NOTE — Progress Notes (Signed)
Daily Rounding Note  01/28/2020, 10:45 AM  LOS: 8 days   SUBJECTIVE:    No complaints, feels fine. Denies any nausea, vomiting, abdominal pain, melena or bright red blood per rectum  OBJECTIVE:         Vital signs in last 24 hours:    Temp:  [97.5 F (36.4 C)-98.8 F (37.1 C)] 98 F (36.7 C) (09/16 0331) Pulse Rate:  [89-101] 101 (09/16 0331) Resp:  [14-32] 18 (09/16 0331) BP: (101-152)/(62-95) 122/74 (09/16 0331) SpO2:  [94 %-100 %] 99 % (09/16 0331) Last BM Date: 01/27/20 Filed Weights   01/20/20 1724  Weight: 89.5 kg   General: NAD Abdomen: soft, No distension or tender   Intake/Output from previous day: 09/15 0701 - 09/16 0700 In: 1269.9 [I.V.:644.9; Blood:625] Out: 1300 [Urine:1300]  Intake/Output this shift: Total I/O In: -  Out: 500 [Urine:500]  Lab Results: Recent Labs    01/26/20 0404 01/27/20 0439 01/28/20 0330  WBC 18.4* 13.9* 9.4  HGB 9.8* 7.9* 11.1*  HCT 30.3* 24.9* 33.3*  PLT 548* 452* 401*   BMET Recent Labs    01/27/20 0439 01/28/20 0330  NA 135 136  K 4.3 3.6  CL 104 99  CO2 25 27  GLUCOSE 140* 98  BUN 23 18  CREATININE 0.46 0.52  CALCIUM 8.5* 9.2   LFT Recent Labs    01/27/20 0439  PROT 5.5*  ALBUMIN 2.6*  AST 35  ALT 49*  ALKPHOS 57  BILITOT 0.3   Scheduled Meds: . [START ON 01/29/2020] aspirin  325 mg Per Tube Daily  . chlorhexidine  15 mL Mouth Rinse BID  . Chlorhexidine Gluconate Cloth  6 each Topical Daily  . cholestyramine  4 g Per Tube TID  . [START ON 01/29/2020] clopidogrel  75 mg Per Tube Daily  . feeding supplement (PROSource TF)  45 mL Per Tube Daily  . free water  200 mL Per Tube Q6H  . insulin aspart  0-15 Units Subcutaneous Q4H  . mouth rinse  15 mL Mouth Rinse q12n4p  . methylphenidate  10 mg Per NG tube BID WC  . [START ON 01/30/2020] pantoprazole  40 mg Intravenous Q12H  . polycarbophil  625 mg Per NG tube Daily  . saccharomyces  boulardii  250 mg Per NG tube BID  . sodium chloride flush  10-40 mL Intracatheter Q12H   Continuous Infusions: . sodium chloride 50 mL/hr at 01/27/20 2103  . dextrose 5 % and 0.45% NaCl    . feeding supplement (VITAL 1.5 CAL) Stopped (01/27/20 2033)  . pantoprozole (PROTONIX) infusion 8 mg/hr (01/28/20 1039)   PRN Meds:.acetaminophen, alum & mag hydroxide-simeth, bisacodyl, dextrose 5 % and 0.45% NaCl, diphenhydrAMINE, Gerhardt's butt cream, guaiFENesin-dextromethorphan, prochlorperazine **OR** prochlorperazine **OR** prochlorperazine, sodium chloride flush, traZODone    ASSESMENT:   *    GIB w black stools/melena. Elevated BUN improved 33 >> 23.  Was getting Protonix 40 po bid, now on Protonix gtt as of 9/14.   9/14 EGD showed non-bleeding erosive gastritis and HH.  No real source for reported  Melena. Husband wishes to avoid further invasive testing if possible, so no plans for colonoscopy.    *    Blood loss anemia.  Hgb 12 >> 7.9 >> 1 PRBC >> 11.1 Vigorous response to the transfusion.         *   ICH w bil infarcts.  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  meds on  hold for now.  Last Plavix 9/14.    *   Adenomatous colon polyps, hemorrhoids 2016.  this done thru [facility] provider.      PLAN   *   Restart Plavix  9/17.    *  Discontinued PPI drip.  Add protonix 40 mg/day.    *   Continue NPO for neurogenic dysphagia.  Would take cues for advancing diet from SLP, they did not do repeat speech eval yest due to NPO for the EGD.    *   GI will sign off but call us back for signif drop in Hgb or persistent black stools.  No plans for GI office fup.      Erika Stanton  01/28/2020, 10:45 AM Phone [phone]    Attending physician's note   I have taken an interval history, reviewed the chart and examined the patient. I agree with the Advanced Practitioner's note, impression and recommendations.   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  Melena, EGD was unremarkable, no source for possible upper GI bleed other than mild erosive esophagitis  Hemoglobin responds to PRBC transfusion more than expected, 11 this morning  We will hold off additional GI work-up at this point given she has no further episodes of bleeding and is currently hemodynamically stable  Discussed plan with her husband, he is in agreement  Continue Protonix 40 mg daily  Continue aspirin, okay to restart Plavix tomorrow  GI will sign off, available if have any questions or concerns      I have spent 25 minutes of patient care (this includes precharting, chart review, review of results, face-to-face time used for counseling as well as treatment plan and follow-up. The patient was provided an opportunity to ask questions and all were answered. The patient agreed with the plan and demonstrated an understanding of the instructions.  Damaris Hippo , MD [phone]

## 2020-01-28 NOTE — Progress Notes (Signed)
Speech Language Pathology Daily Session Note  Patient Details  Name: Erika Stanton MRN: [id number] Date of Birth: April 18, 1956  Today's Date: 01/28/2020 SLP Individual Time: 1400-1410 SLP Individual Time Calculation (min): 10 min and Today's Date: 01/28/2020 SLP Missed Time: 50 Minutes Missed Time Reason: Patient fatigue  Short Term Goals: Week 1: SLP Short Term Goal 1 (Week 1): Patient will increase speech intelligibility to ~50% at the phrase level with Max A multimodal cues for use of speech intelligibility strategies. SLP Short Term Goal 2 (Week 1): Patient will consume trials of ice chips with minimal overt s/s of aspiration over 2 sessions with Mod A vebral cues to assess readiness for repeat MBS SLP Short Term Goal 3 (Week 1): Patient will attend to a functional task for ~5 minutes with Max A multimodal cues.  Skilled Therapeutic Interventions: Skilled treatment session focused on cognitive goals. Upon arrival, patient was awake in bed with both eyes open and her head in midline. Patient immediately declined participation due to it being "a hard day."  Patient declined trials but was agreeable to [facility] tomorrow. Patient also accurately recalled the results from her endoscopy . Despite multiple options for treatment session, patient continued to decline session. Patient was ~75% intelligible and was able to turn to her left to look out the door X2. Patient left in bed with alarm on and all needs within reach. Continue with current plan of care.      Pain No/Denies Pain   Therapy/Group: Individual Therapy  Jessie Schrieber 01/28/2020, 2:34 PM

## 2020-01-28 NOTE — Progress Notes (Addendum)
[facility] PHYSICAL MEDICINE & REHABILITATION PROGRESS NOTE   Subjective/Complaints: Pt had cortrak removed as part of EGD yesterday. Pt without complaints but doesn't want to do therapy today!  ROS: Limited due to cognitive/behavioral    Objective:   No results found. Recent Labs    01/27/20 0439 01/28/20 0330  WBC 13.9* 9.4  HGB 7.9* 11.1*  HCT 24.9* 33.3*  PLT 452* 401*   Recent Labs    01/27/20 0439 01/28/20 0330  NA 135 136  K 4.3 3.6  CL 104 99  CO2 25 27  GLUCOSE 140* 98  BUN 23 18  CREATININE 0.46 0.52  CALCIUM 8.5* 9.2    Intake/Output Summary (Last 24 hours) at 01/28/2020 1101 Last data filed at 01/28/2020 0834 Gross per 24 hour  Intake 1269.85 ml  Output 1800 ml  Net -530.15 ml     Physical Exam: Vital Signs Blood pressure 122/74, pulse (!) 101, temperature 98 F (36.7 C), temperature source Oral, resp. rate 18, height 5\' 6"  (1.676 m), weight 89.5 kg, SpO2 99 %.  Constitutional: No distress . Vital signs reviewed. HEENT: EOMI, oral membranes moist Neck: supple Cardiovascular: RRR without murmur. No JVD    Respiratory/Chest: CTA Bilaterally without wheezes or rales. Normal effort    GI/Abdomen: BS +, non-tender, non-distended Ext: no clubbing, cyanosis, or edema Psych: pleasant and cooperative Musc: Left SCM tight still, rotates to left Neuro: more engaging. Speech still quite Dysarthric.  Eyes open  Motor: still no movement noted in right upper extremity Skin: numerous bruises along limbs and abdomen which appear stable  Assessment/Plan: 1. 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  Physiatrist is providing close team supervision and 24 hour management of active medical problems listed below.  Physiatrist and rehab team continue to assess barriers to discharge/monitor patient progress toward functional and  medical goals  Care Tool:  Bathing        Body parts bathed by helper: Right arm, Left arm, Chest, Abdomen, Front perineal area, Buttocks, Right upper leg, Left upper leg, Right lower leg, Left lower leg, Face     Bathing assist Assist Level: Dependent - Patient 0%     Upper Body Dressing/Undressing Upper body dressing   What is the patient wearing?: Pull over shirt    Upper body assist Assist Level: Dependent - Patient 0%    Lower Body Dressing/Undressing Lower body dressing      What is the patient wearing?: Pants     Lower body assist Assist for lower body dressing: Maximal Assistance - Patient 25 - 49%     Toileting Toileting    Toileting assist Assist for toileting: Dependent - Patient 0%     Transfers Chair/bed transfer  Transfers assist     Chair/bed transfer assist level: 2 Helpers     Locomotion Ambulation   Ambulation assist      Assist level: 2 helpers Assistive device: Other (comment) (swedish walker) Max distance: 10   Walk 10 feet activity   Assist     Assist level: 2 helpers Assistive device: Other (comment) (swedish walker)   Walk 50 feet activity   Assist Walk 50 feet with 2 turns activity did not occur: Safety/medical concerns         Walk 150 feet activity   Assist Walk 150 feet activity did not occur: Safety/medical concerns         Walk  10 feet on uneven surface  activity   Assist Walk 10 feet on uneven surfaces activity did not occur: Safety/medical concerns         Wheelchair     Assist Will patient use wheelchair at discharge?: Yes Type of Wheelchair: Manual           Wheelchair 50 feet with 2 turns activity    Assist    Wheelchair 50 feet with 2 turns activity did not occur: Safety/medical concerns (requires TIS for postural support)       Wheelchair 150 feet activity     Assist  Wheelchair 150 feet activity did not occur: Safety/medical concerns       Blood  pressure 122/74, pulse (!) 101, temperature 98 F (36.7 C), temperature source Oral, resp. rate 18, height 5\' 6"  (1.676 m), weight 89.5 kg, SpO2 99 %.  Medical Problem List and Plan: 1.  Right hemiparesis, dysphagia, cognitive deficits with disorientation, unintelligible speech lethargy secondary to bilateral intracranial hemorrhages with subsequent infarcts.  Continue CIR, 2.  Antithrombotics: -DVT/anticoagulation:  Pharmaceutical: Heparin--hold                          Dopplers negative for DVT.              -antiplatelet therapy: DAPT--resume plavix, asa tomorrow 3. Pain Management: Tylenol prn.  Appears to be well controlled.  4. Mood: LCSW to follow for evaluation and support when appropriate.              -antipsychotic agents: N/A 5. Neuropsych: This patient is not capable of making decisions on her own behalf.  Continue trial of ritalin for arousal/stimulation--increased to 10mg  9/13 6. Skin/Wound Care: routine pressure relief measures.  7. Fluids/Electrolytes/Nutrition: NPO    Increased H2O flushes  -BUN stable. Will change to d5 1/2ns at 75cc/hr until cortrak replaced  -resume TF tomorrow  -continue to replace potassium as possible.  8. ABLA/UGIB:     EGD 9/15 apparently demonstrated non-bleeding gastritis, HH   -husband doesn't want to pursue further testing  -holding plavix/asa for now. Stop sq heparin   -protonix per tube now  -GI consult appreciated!  -hgb up to 11.1 after 2u PRBC  --pt appears comfortable, stable 9. Thrombocytopenia:   Monitor for signs of bleeding.  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 Oral care qid.  -will replace with regular NGT today.  11. Resting tachycardia:              has been ranging from 90's to 100's 12. Vasospasm prevention: To continue Nimotop for 3 additional days.  13. Bowel incontinence/loose stool: multifactorial, on abx for uti also  -continue fiber/probiotic  -stools still loose  -9/13  added cholestyramine TID  - see #8 14. Proteus UTI: Ceftriaxone course completed  -f/u ua negative 18.  Prediabetes, confounded by tube feeds  Relatively controlled on 9/13  Continue to monitor 19.  Leukoctyosis: improved today to 9k, afebrile  -UA negative , procalcitonin wnl  - cxr clear  -likely reactive related to GIB  -treat supportively, serial labs   LOS: 8 days A FACE TO FACE EVALUATION WAS PERFORMED  Meredith Staggers 01/28/2020, 11:01 AM

## 2020-01-28 NOTE — Progress Notes (Signed)
Physical Therapy Session Note  Patient Details  Name: Erika Stanton MRN: [id number] Date of Birth: Nov 07, 1955  Today's Date: 01/28/2020 PT Individual Time: 1100-1154 PT Individual Time Calculation (min): 54 min   Short Term Goals: Week 2:  PT Short Term Goal 1 (Week 2): Pt will complete bed<>w/c with max assist +1 consistently. PT Short Term Goal 2 (Week 2): Pt will demonstrate static sitting balance x 5 minutes with min assist. PT Short Term Goal 3 (Week 2): Pt will consistently complete bed mobility with max assist +1.  Skilled Therapeutic Interventions/Progress Updates:    Pt presents up in TIS w/c and perseverating on going back to bed throughout session. Encouragement and distraction provided throughout. Pt performs mod +2 squat pivot transfer to mat table with focus on initiating transfer and maintaining postural control. Seated edge of mat, focused on postural control retraining for balance, head positioning, attention, and functional tasks with UE's (pt continues to want a wash cloth to wipe her mouth throughout the session, attempting to adjust socks in figure 4 position (able to get herself in this position with RLE but difficultly with L due to tightness/tone?). In the process her IV on L side noticed to be loose and then eventually came out (RN made aware and disconnected during session). Pt ended up having to work on dynamic sitting balance about ~20 min edge of mat with redirection for attention and encouragement to complete session. After IV issue addressed, returned to attempts for standing. Multiple attempts with +2 assist and various techniques but once able to coordinate and pt initiating, maintained standing with mod +2 assist and cues for hip/trunk extension, and overall postural control. Transferred back to w/c with mod +2 similar manner and then again once back to bed with focus on technique, postural control, and following of directions/sequencing. Pt returned to supine  managing her own LE's and required mod for trunk control. Bridging to reposition and then repositioned UE's to be supported.   Therapy Documentation Precautions:  Precautions Precautions: Fall Precaution Comments: NPO Restrictions Weight Bearing Restrictions: No   Pain: No reports of pain.   Therapy/Group: Individual Therapy  Canary Brim Ivory Broad, PT, DPT, CBIS  01/28/2020, 12:30 PM

## 2020-01-28 NOTE — Progress Notes (Addendum)
Heard pt moaning. Entered room. Pt said "my hand is hurting" Pt directs nurse to right hand. Pt states "take that thing off of my hand" referring to splint. Splint removed. Fingers are warm and there is no skin issues to right hand. Placed pt's right hand on a pillow. Pt states "that's better. Now get me a blanket". Another blanket placed on pt who is now comfortable.

## 2020-01-28 NOTE — Progress Notes (Signed)
Nutrition Follow-up  DOCUMENTATION CODES:   Not applicable  INTERVENTION:  Once Cortrak replaced, restart tube feeds: - Osmolite 1.5 @ 65 ml/hr (can be held for up to 4 hours for therapies) - ProSource TF BID - Free water per MD/PA, currently 200 ml q 6 hours  Tube feeding regimen with current free water flushes provides 2030 kcal, 104 grams of protein, and 1791 ml of H2O (meets 100% of needs).  NUTRITION DIAGNOSIS:   Inadequate oral intake related to dysphagia, lethargy/confusion as evidenced by NPO status.  Ongoing  GOAL:   Patient will meet greater than or equal to 90% of their needs  Will be met once TF restarted  MONITOR:   Diet advancement, Labs, Weight trends, TF tolerance, I & O's  REASON FOR ASSESSMENT:   Consult Enteral/tube feeding initiation and management  ASSESSMENT:   64 year old female who was admitted on 01/05/20 with right-sided headache and left hemiparesis. 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 Pt remains NPO due to signs of aspiration with tube feeds via Cortrak. Admitted to CIR on 9/08.  9/15 - s/p EGD with findings of non-bleeding reflux esophagitis, Cortrak removed  Pt remains NPO. Pt had several episodes of melenic stool and GI consulted. Pt is s/p EGD yesterday with findings of non-bleeding reflux esophagitis. Cortrak removed during EGD. Per GI note, pt's husband wishes to avoid further invasive testing so no plans for colonoscopy.  Plan is for Cortrak to be replaced tomorrow by [facility] team.  No new weights since 9/08.  Current TF orders (not infusing due to lack of enteral  access): Vital 1.5 @ 65 ml/hr, ProSource TF 45 ml daily, free water 200 ml q 6 hours  Medications reviewed and include: Questran TID, ritalin, protonix, fibercon, florastor IVF: D5 in 1/2 NS @ 75 ml/hr  Labs reviewed. CBG's: 81-96 x 24 hours  UOP: 1300 ml x 24 hours  Diet Order:   Diet Order            Diet NPO time specified Except for: Other (See Comments)  Diet effective midnight                 EDUCATION NEEDS:   No education needs have been identified at this time  Skin:  Skin Assessment: Reviewed RN Assessment  Last BM:  01/21/20 rectal tube  Height:   Ht Readings from Last 1 Encounters:  01/20/20 _0  (1.676 m)    Weight:   Wt Readings from Last 1 Encounters:  01/20/20 89.5 kg    Ideal Body Weight:  59.1 kg  BMI:  Body mass index is 31.85 kg/m.  Estimated Nutritional Needs:   Kcal:  1900-2100  Protein:  95-115 grams  Fluid:  >/= 1.9 L    Gaynell Face, MS, RD, LDN Inpatient Clinical Dietitian Please see [facility] for contact information.

## 2020-01-29 ENCOUNTER — Inpatient Hospital Stay (HOSPITAL_COMMUNITY): Payer: BC Managed Care – PPO

## 2020-01-29 ENCOUNTER — Inpatient Hospital Stay (HOSPITAL_COMMUNITY): Payer: BC Managed Care – PPO | Admitting: Physical Therapy

## 2020-01-29 ENCOUNTER — Inpatient Hospital Stay (HOSPITAL_COMMUNITY): Payer: BC Managed Care – PPO | Admitting: Occupational Therapy

## 2020-01-29 ENCOUNTER — Encounter (HOSPITAL_COMMUNITY): Payer: BC Managed Care – PPO | Admitting: Occupational Therapy

## 2020-01-29 LAB — BASIC METABOLIC PANEL
Anion gap: 10 (ref 5–15)
BUN: 12 mg/dL (ref 8–23)
CO2: 26 mmol/L (ref 22–32)
Calcium: 9 mg/dL (ref 8.9–10.3)
Chloride: 101 mmol/L (ref 98–111)
Creatinine, Ser: 0.49 mg/dL (ref 0.44–1.00)
GFR calc Af Amer: >60 mL/min (ref >60)
GFR calc non Af Amer: >60 mL/min (ref >60)
Glucose, Bld: 121 mg/dL — ABNORMAL HIGH (ref 70–99)
Potassium: 3.4 mmol/L — ABNORMAL LOW (ref 3.5–5.1)
Sodium: 137 mmol/L (ref 135–145)

## 2020-01-29 LAB — CBC
HCT: 33.8 % — ABNORMAL LOW (ref 36.0–46.0)
Hemoglobin: 11.1 g/dL — ABNORMAL LOW (ref 12.0–15.0)
MCH: 31.3 pg (ref 26.0–34.0)
MCHC: 32.8 g/dL (ref 30.0–36.0)
MCV: 95.2 fL (ref 80.0–100.0)
Platelets: 421 10*3/uL — ABNORMAL HIGH (ref 150–400)
RBC: 3.55 MIL/uL — ABNORMAL LOW (ref 3.87–5.11)
RDW: 13.6 % (ref 11.5–15.5)
WBC: 6.8 10*3/uL (ref 4.0–10.5)
nRBC: 0 % (ref 0.0–0.2)

## 2020-01-29 LAB — GLUCOSE, CAPILLARY
Glucose-Capillary: 114 mg/dL — ABNORMAL HIGH (ref 70–99)
Glucose-Capillary: 116 mg/dL — ABNORMAL HIGH (ref 70–99)
Glucose-Capillary: 116 mg/dL — ABNORMAL HIGH (ref 70–99)
Glucose-Capillary: 122 mg/dL — ABNORMAL HIGH (ref 70–99)
Glucose-Capillary: 130 mg/dL — ABNORMAL HIGH (ref 70–99)
Glucose-Capillary: 145 mg/dL — ABNORMAL HIGH (ref 70–99)

## 2020-01-29 MED ORDER — PANTOPRAZOLE SODIUM 40 MG IV SOLR
40.0000 mg | Freq: Two times a day (BID) | INTRAVENOUS | Status: DC
  Administered 2020-01-29 – 2020-02-02 (×9): 40 mg via INTRAVENOUS
  Filled 2020-01-29 (×9): qty 40

## 2020-01-29 MED ORDER — POTASSIUM CHLORIDE 20 MEQ/15ML (10%) PO SOLN
40.0000 meq | Freq: Every day | ORAL | Status: DC
  Administered 2020-01-29 – 2020-02-04 (×7): 40 meq
  Filled 2020-01-29 (×7): qty 30

## 2020-01-29 MED ORDER — ACETAMINOPHEN 650 MG RE SUPP
650.0000 mg | Freq: Four times a day (QID) | RECTAL | Status: DC | PRN
  Administered 2020-01-29: 650 mg via RECTAL
  Filled 2020-01-29: qty 1

## 2020-01-29 MED ORDER — KCL IN DEXTROSE-NACL 40-5-0.45 MEQ/L-%-% IV SOLN
INTRAVENOUS | Status: DC
  Filled 2020-01-29 (×3): qty 1000

## 2020-01-29 NOTE — Progress Notes (Signed)
Occupational Therapy Session Note  Patient Details  Name: Erika Stanton MRN: [id number] Date of Birth: 1955/07/21  Today's Date: 01/29/2020 OT Individual Time: 1105-1200 OT Individual Time Calculation (min): 55 min    Short Term Goals: Week 2:  OT Short Term Goal 1 (Week 2): Pt will use L hand with mod A to wash 50 % of her UB. OT Short Term Goal 2 (Week 2): Pt will be able to sit upright on  a BSC with min A. OT Short Term Goal 3 (Week 2): Pt will be able to squat pivot with mod A of 2 to a BSC. OT Short Term Goal 4 (Week 2): Pt will be able to sit to stand with mod A of 2 to increase skills needed for LB dressing.  Skilled Therapeutic Interventions/Progress Updates:    Pt received in bed stating she was very stressed and upset as the earlier session with the swallow study felt taxing to her.  She initially refused therapy and even with a great deal of encouragement refused any bathing or dressing bed level.   She finally agreed to allowing me to work on PROM for her BUE, neck range of motion.  Spent significant time with B scapula/shoulder ROM followed by wrist and grasp.  She had no active RUE and demonstrates flexion spasticity when she yawns.  In the LUE, she has some active bicep and tricep but not able to move fully against gravity without A.  Focused on traps (had several trigger points on L side) with neck ROM.  Pt tolerated well.  Worked on L hand actively picking up cards with forearm support but pt not able to put in much effort. Completed a visual tracking card match activity which pt was able to find well in R visual field.   Pt resting in bed with all needs met.   Therapy Documentation Precautions:  Precautions Precautions: Fall Precaution Comments: NPO Restrictions Weight Bearing Restrictions: No  Pain: c/o headache but said she had received tylenol   ADL: ADL Eating: NPO Grooming: Dependent Where Assessed-Grooming: Bed level Upper Body Bathing:  Dependent Where Assessed-Upper Body Bathing: Bed level Lower Body Bathing: Dependent Where Assessed-Lower Body Bathing: Bed level Upper Body Dressing: Dependent Where Assessed-Upper Body Dressing: Edge of bed Lower Body Dressing: Maximal assistance, Dependent Where Assessed-Lower Body Dressing: Bed level Toileting: Dependent Where Assessed-Toileting: Bed level ADL Comments: patient able to hold wash cloth with left hand but unable to manipulate for washing, she is able to lift each leg for donning pants/socks   Therapy/Group: Individual Therapy  Nicholous Girgenti 01/29/2020, 12:44 PM

## 2020-01-29 NOTE — Progress Notes (Addendum)
[facility] PHYSICAL MEDICINE & REHABILITATION PROGRESS NOTE   Subjective/Complaints:  pt up in bed. Awake. Slept somewhat. C/o headache.   ROS: Patient denies fever, rash, sore throat, blurred vision, nausea, vomiting, diarrhea, cough, shortness of breath or chest pain, joint or back pain, headache, or mood change.   Objective:   No results found. Recent Labs    01/28/20 0330 01/29/20 0429  WBC 9.4 6.8  HGB 11.1* 11.1*  HCT 33.3* 33.8*  PLT 401* 421*   Recent Labs    01/28/20 0330 01/29/20 0429  NA 136 137  K 3.6 3.4*  CL 99 101  CO2 27 26  GLUCOSE 98 121*  BUN 18 12  CREATININE 0.52 0.49  CALCIUM 9.2 9.0    Intake/Output Summary (Last 24 hours) at 01/29/2020 1107 Last data filed at 01/29/2020 1009 Gross per 24 hour  Intake 997.98 ml  Output 1950 ml  Net -952.02 ml     Physical Exam: Vital Signs Blood pressure (!) 144/79, pulse 88, temperature 98.2 F (36.8 C), resp. rate 16, height 5\' 6"  (1.676 m), weight 89.5 kg, SpO2 94 %.  Constitutional: No distress . Vital signs reviewed. HEENT: EOMI, oral membranes moist Neck: supple Cardiovascular: RRR without murmur. No JVD    Respiratory/Chest: CTA Bilaterally without wheezes or rales. Normal effort    GI/Abdomen: BS +, non-tender, non-distended Ext: no clubbing, cyanosis, or edema Psych: pleasant and cooperative Musc: Left SCM tight still, head more in a neutral position today Neuro: more alert. Engages. Speech clearer with better phonation  Motor: still no movement noted in right upper extremity, flacid. Moves LUE and bilateral LE's  Skin: numerous bruises along limbs and abdomen which appear stable  Assessment/Plan: 1. 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  Physiatrist is providing close team supervision and 24 hour management of active medical problems listed  below.  Physiatrist and rehab team continue to assess barriers to discharge/monitor patient progress toward functional and medical goals  Care Tool:  Bathing        Body parts bathed by helper: Front perineal area, Buttocks, Right upper leg, Left upper leg, Right lower leg, Left lower leg (LB only this session)     Bathing assist Assist Level: Dependent - Patient 0%     Upper Body Dressing/Undressing Upper body dressing   What is the patient wearing?: Pull over shirt    Upper body assist Assist Level: Dependent - Patient 0%    Lower Body Dressing/Undressing Lower body dressing      What is the patient wearing?: Pants     Lower body assist Assist for lower body dressing: Total Assistance - Patient < 25%     Toileting Toileting    Toileting assist Assist for toileting: Dependent - Patient 0%     Transfers Chair/bed transfer  Transfers assist     Chair/bed transfer assist level: 2 Helpers     Locomotion Ambulation   Ambulation assist      Assist level: 2 helpers Assistive device: Other (comment) (swedish walker) Max distance: 10   Walk 10 feet activity   Assist     Assist level: 2 helpers Assistive device: Other (comment) (swedish walker)   Walk 50 feet activity   Assist Walk 50 feet with 2 turns activity did not occur: Safety/medical concerns         Walk 150 feet activity   Assist Walk 150  feet activity did not occur: Safety/medical concerns         Walk 10 feet on uneven surface  activity   Assist Walk 10 feet on uneven surfaces activity did not occur: Safety/medical concerns         Wheelchair     Assist Will patient use wheelchair at discharge?: Yes Type of Wheelchair: Manual           Wheelchair 50 feet with 2 turns activity    Assist    Wheelchair 50 feet with 2 turns activity did not occur: Safety/medical concerns (requires TIS for postural support)       Wheelchair 150 feet activity      Assist  Wheelchair 150 feet activity did not occur: Safety/medical concerns       Blood pressure (!) 144/79, pulse 88, temperature 98.2 F (36.8 C), resp. rate 16, height 5\' 6"  (1.676 m), weight 89.5 kg, SpO2 94 %.  Medical Problem List and Plan: 1.  Right hemiparesis, dysphagia, cognitive deficits with disorientation, unintelligible speech lethargy secondary to bilateral intracranial hemorrhages with subsequent infarcts.  Continue CIR, PT, OT, SLP  -spoke with husband again to give him an update on her medical status. Discussed the fact that with her large ICH and bilateral infarcts that this will be a prolonged process. He worries a lot about the future and her recovery, and sometimes this gets the better of him.  2.  Antithrombotics: -DVT/anticoagulation:  Pharmaceutical: Heparin--hold                          Dopplers negative for DVT.              -antiplatelet therapy: DAPT--resume plavix, asa   3. Pain Management: Tylenol prn.  Appears to be well controlled.  4. Mood: LCSW to follow for evaluation and support when appropriate.              -antipsychotic agents: N/A 5. Neuropsych: This patient is not capable of making decisions on her own behalf.  Continue trial of ritalin for arousal/stimulation--increased to 10mg  9/13   -arousal and mentation gradually improving 6. Skin/Wound Care: routine pressure relief measures.  7. Fluids/Electrolytes/Nutrition: NPO       -9/17 BUN/Cr stable to improved  -currently on d5 1/2ns at 75cc/hr    -await results of MBS before we replace cortrak  -hypokalemia:  replacing potassium (add to IVF for now)  8. ABLA/UGIB:     EGD 9/15 apparently demonstrated non-bleeding esophagitis/gastritis, HH   -husband doesn't want to pursue further testing  9/17: resume plavix/asa today. Will not resume sq heparin    -protonix per tube now (or IV if needed today)   -GI consult appreciated!   -hgb holding at 11.1 after 2u PRBC   -serial CBC's 9.  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 Oral care qid.  -will replace with regular NGT today.  11. Resting tachycardia:              has been ranging from 90's to 100's 12. Vasospasm prevention: To continue Nimotop for 3 additional days.  13. Bowel incontinence/loose stool: multifactorial, on abx for uti also  -  fiber/probiotic  -stools ceased d/t stopping TF over last 48 hours  -9/13 added cholestyramine TID  - see #8 14. Proteus UTI: Ceftriaxone course completed  -f/u ua negative 18.  Prediabetes, confounded by tube feeds  Relatively controlled on 9/17  Continue to monitor 19.  Leukoctyosis: resolved  -UA negative , procalcitonin wnl  - cxr clear  -likely reactive related to GIB      LOS: 9 days A FACE TO FACE EVALUATION WAS PERFORMED  Meredith Staggers 01/29/2020, 11:07 AM

## 2020-01-29 NOTE — Progress Notes (Signed)
Modified Barium Swallow Progress Note  Patient Details  Name: TAMIAH DYSART MRN: [id number] Date of Birth: [birth year]  Today's Date: 01/29/2020  Modified Barium Swallow completed.  Full report located under Chart Review in the Imaging Section.  Brief recommendations include the following:  Clinical Impression  Patient demonstrates a moderate oropharyngeal dysphagia which is further exacerbated by cognitive deficits. Throughout the MBS, patient required constant encouragement for acceptance of trials and to take "big" bites and sips of trials. Patient also required constant cueing for awareness of bolus and for not attempting to talk while swallowing.  Patient with impaired epiglottic inversion, anterior laryngeal mobility and pharyngeal constriction resulting in mild vallecular residue with both solids and liquids that was reduced with additional swallows. Patient with sensed aspiration of thin liquids via straw that also appeared to mix with secretions resulting in intermittent aspiration below the vocal cords that was squeezed back out after completion of swallow. Recommend patient initiate a diet of Dys. 1 textures with nectar-thick liquids via tsp but suspect additional means of nutrition may be warranted as she may not meet her nutritional needs based on PO intake.   Swallow Evaluation Recommendations       SLP Diet Recommendations: Dysphagia 1 (Puree) solids;Nectar thick liquid   Liquid Administration via: No straw;Spoon   Medication Administration: Crushed with puree (or via tube)   Supervision: Full supervision/cueing for compensatory strategies;Full assist for feeding   Compensations: Minimize environmental distractions;Slow rate;Small sips/bites;Monitor for anterior loss;Multiple dry swallows after each bite/sip   Postural Changes: Seated upright at 90 degrees   Oral Care Recommendations: Oral care BID   Other Recommendations: Order thickener from pharmacy;Prohibited food  (jello, ice cream, thin soups);Remove water pitcher;Have oral suction available    Lakeeta Dobosz 01/29/2020,4:11 PM

## 2020-01-29 NOTE — Progress Notes (Signed)
Speech Language Pathology Weekly Progress Note  Patient Details  Name: Erika Stanton MRN: [id number] Date of Birth: 27-Mar-1956  Beginning of progress report period: January 20, 2020 End of progress report period: January 29, 2020  Today's Date: 01/29/2020 SLP Individual Time: 9450-3888 SLP Individual Time Calculation (min): 25 min  Short Term Goals: Week 1: SLP Short Term Goal 1 (Week 1): Patient will increase speech intelligibility to ~50% at the phrase level with Max A multimodal cues for use of speech intelligibility strategies. SLP Short Term Goal 1 - Progress (Week 1): Met SLP Short Term Goal 2 (Week 1): Patient will consume trials of ice chips with minimal overt s/s of aspiration over 2 sessions with Mod A vebral cues to assess readiness for repeat MBS SLP Short Term Goal 2 - Progress (Week 1): Met SLP Short Term Goal 3 (Week 1): Patient will attend to a functional task for ~5 minutes with Max A multimodal cues. SLP Short Term Goal 3 - Progress (Week 1): Met    New Short Term Goals: Week 2: SLP Short Term Goal 1 (Week 2): Patient will increase speech intelligibility to ~75% at the phrase level with Min A multimodal cues for use of speech intelligibility strategies. SLP Short Term Goal 2 (Week 2): Patient will attend to a functional task for ~10 minutes with Max A multimodal cues. SLP Short Term Goal 3 (Week 2): Patient will consume current diet with minimal overt s/s of aspiration and overall Mod A verbal cues for use of swallowing compensatory strategies. SLP Short Term Goal 4 (Week 2): Patient will conusme trials of thin liquids via tsp without overt s/s of aspiraiton over 3 sessions with Mod verbla cues to assess readiness for repeat MBS.  Weekly Progress Updates: Patient has made functional gains and has met 3 of 3 STGs this reporting period. Currently, patient requires overall Mod-Max A multimodal cues for use of speech intelligibility strategies at the phrase level to  achieve ~75% intelligibility. Patient demonstrates increased arousal but continues to require overall Max A multimodal cues and encouragement for sustained attention and participation in treatment sessions. Patient has been consuming trials of ice chips with minimal overt s/s of aspiration and had an MBS today to assess swallow function. Patient's current swallowing function is characterized by pharyngeal residuals with all solids and liquids that is reduced with additional swallows. Sensed aspiration of thin liquids was also observed. Recommend patient initiate a diet of Dys. 1 textures with nectar-thick liquids but suspect she will be unable to meet nutritional needs based on PO intake alone. Patient would benefit from continued skilled SLP intervention to maximize her cognitive, swallowing and speech function prior to discharge.     Intensity: Minumum of 1-2 x/day, 30 to 90 minutes Frequency: 3 to 5 out of 7 days Duration/Length of Stay: 4 weeks Treatment/Interventions: Cognitive remediation/compensation;Dysphagia/aspiration precaution training;Internal/external aids;Speech/Language facilitation;Therapeutic Activities;Environmental controls;Cueing hierarchy;Functional tasks;Patient/family education   Daily Session  Skilled Therapeutic Interventions: Skilled treatment session focused on dysphagia goals. SLP facilitated session by providing encouragement for PO intake. Patient consumed minimal amounts of Dys. 1 textures with nectar-thick liquids via tsp without overt s/s of aspiration, recommend patient continue current diet. Patient's husband present and educated on results of MBS and current plan of care, he verbalized understanding and agreement. patient left upright in the wheelchair with husband present. Continue with current plan of care.      Pain No/Denies Pain  Erika Stanton 01/29/2020, 6:33 AM

## 2020-01-29 NOTE — Progress Notes (Signed)
Physical Therapy Session Note  Patient Details  Name: Erika Stanton MRN: [id number] Date of Birth: [birth year]  Today's Date: 01/29/2020 PT Individual Time: 3785-8850 and 2774-1287 PT Individual Time Calculation (min): 72 min and 25 min  Short Term Goals: Week 2:  PT Short Term Goal 1 (Week 2): Pt will complete bed<>w/c with max assist +1 consistently. PT Short Term Goal 2 (Week 2): Pt will demonstrate static sitting balance x 5 minutes with min assist. PT Short Term Goal 3 (Week 2): Pt will consistently complete bed mobility with max assist +1.  Skilled Therapeutic Interventions/Progress Updates:  Treatment 1: Pt received in bed with husband present at beginning & end of session. PT educates pt's husband Ronalee Belts) on recovery & purpose of PT following injury, and current PT goals. Pt requires max assist for supine>sit but does initiate uprighting trunk on this date. Pt requires up to max assist for static sitting balance as pt with posterior LOB. Pt completes stand pivot bed>w/c with max assist +2 with PT assisting with anterior weight shift & pivot to w/c. In gym, pt completes stand pivot w/c>EOM in same manner. Sit<>stand with +2 assist via 3 muskateers with pt able to weight bear through BLE. Gait x 12 ft + 10 ft + 10 ft + 10 ft with +2 assist via 3 muskateers with pt able to initiate stepping with BLE but does require assistance for placement of LLE 2/2 some L adductor tone & pt impulsive & attempting to step before PT provides cuing to do so. Pt perseverative on going back to bed/laying down throughout session with PT attempting to redirect but with little success. Pt requests to wipe nose but has great difficulty grasping tissue & PT has to place it in her hand. From w/c level, pt utilized kinetron for BLE strengthening & NMR 1 minute + 1 minute + 30 seconds before pt becoming unagreeable to further activity. At end of session pt left in TIS w/c with seat belt & chair alarm donned, half lap tray  donned, pancake call bell in reach, husband present in room. Pain: c/o RUE pain but pt declines asking for pain medication, repositioning provided PRN During session PT did provide total assist for donning pants at bed level but pt attempts to bridge but is unable to clear buttocks from bed.  Treatment 2: Pt received in w/c requesting to go back to bed with PT & husband encouraging pt to participate. Transported to/from gym via w/c dependent assist for time management. Stand pivot w/c<>mat table with mod +2 assist with cuing for anterior weight shifting & safety as pt with impulsivity. From EOM pt transfers sit<>stand x 2 times with +2 assist via 3 muskateers but only stands <15 seconds at a time. Pt then becoming aggravated, continuing to perseverate on going back to bed & pushing backwards. PT assists pt to w/c & returns pt to room. Stand pivot w/c>bed with mod assist +2, max assist sit>supine to lower trunk & elevate BLE onto bed. PT dons R hand splint. Pt left in bed with alarm set, pancake call bell in hand, HOB locked at 30 degrees. Pain: no formal c/o pain  Therapy Documentation Precautions:  Precautions Precautions: Fall Precaution Comments: NPO Restrictions Weight Bearing Restrictions: No    Therapy/Group: Individual Therapy  [name] 01/29/2020, 3:55 PM

## 2020-01-29 NOTE — Plan of Care (Signed)
  Problem: Consults Goal: RH BRAIN INJURY PATIENT EDUCATION Description: Description: See Patient Education module for eduction specifics Outcome: Progressing Goal: Skin Care Protocol Initiated - if Braden Score 18 or less Description: If consults are not indicated, leave blank or document N/A Outcome: Progressing Goal: Nutrition Consult-if indicated Outcome: Progressing Goal: Diabetes Guidelines if Diabetic/Glucose > 140 Description: If diabetic or lab glucose is > 140 mg/dl - Initiate Diabetes/Hyperglycemia Guidelines & Document Interventions  Outcome: Progressing   Problem: RH BOWEL ELIMINATION Goal: RH STG MANAGE BOWEL WITH ASSISTANCE Description: STG Manage Bowel with min Assistance. Outcome: Progressing Goal: RH STG MANAGE BOWEL W/MEDICATION W/ASSISTANCE Description: STG Manage Bowel with Medication with min Assistance. Outcome: Progressing   Problem: RH BLADDER ELIMINATION Goal: RH STG MANAGE BLADDER WITH ASSISTANCE Description: STG Manage Bladder With min Assistance Outcome: Progressing Goal: RH STG MANAGE BLADDER WITH MEDICATION WITH ASSISTANCE Description: STG Manage Bladder With Medication With min Assistance. Outcome: Progressing   Problem: RH SKIN INTEGRITY Goal: RH STG SKIN FREE OF INFECTION/BREAKDOWN Description: Skin to remain free from infection and breakdown with min assist while on CIR. Outcome: Progressing Goal: RH STG MAINTAIN SKIN INTEGRITY WITH ASSISTANCE Description: STG Maintain Skin Integrity With min Assistance. Outcome: Progressing Goal: RH STG ABLE TO PERFORM INCISION/WOUND CARE W/ASSISTANCE Description: STG Able To Perform Incision/Wound Care With min Assistance. Outcome: Progressing   Problem: RH SAFETY Goal: RH STG ADHERE TO SAFETY PRECAUTIONS W/ASSISTANCE/DEVICE Description: STG Adhere to Safety Precautions With min Assistance and appropriate assistive Device. Outcome: Progressing   Problem: RH COGNITION-NURSING Goal: RH STG USES MEMORY  AIDS/STRATEGIES W/ASSIST TO PROBLEM SOLVE Description: STG Uses Memory Aids/Strategies With min Assistance to Problem Solve. Outcome: Progressing Goal: RH STG ANTICIPATES NEEDS/CALLS FOR ASSIST W/ASSIST/CUES Description: STG Anticipates Needs/Calls for Assist With min Assistance/Cues. Outcome: Progressing   Problem: RH PAIN MANAGEMENT Goal: RH STG PAIN MANAGED AT OR BELOW PT'S PAIN GOAL Description: <3 on a 0-10 pain scale. Outcome: Progressing   Problem: RH KNOWLEDGE DEFICIT BRAIN INJURY Goal: RH STG INCREASE KNOWLEDGE OF SELF CARE AFTER BRAIN INJURY Description: Patient will demonstrate knowledge of medication management, diabetes management, dietary management, and follow up care with the MD post discharge with min assist from [facility] staff. Outcome: Progressing Goal: RH STG INCREASE KNOWLEDGE OF DYSPHAGIA/FLUID INTAKE Description: Patient will be able to demonstrate knowledge of dysphagia diets, thickened liquids, and swallowing precautions with min assist from rehab staff. Outcome: Progressing   Problem: RH Vision Goal: RH LTG Vision (Specify) Outcome: Progressing

## 2020-01-29 NOTE — Procedures (Signed)
Cortrak  Tube Type:  Cortrak - 43 inches Tube Location:  Left nare Initial Placement:  Stomach Secured by: Bridle Technique Used to Measure Tube Placement:  Documented cm marking at nare/ corner of mouth Cortrak Secured At:  60 cm    Cortrak Tube Team Note:  Consult received to place a Cortrak feeding tube.   No x-ray is required. RN may begin using tube.   If the tube becomes dislodged please keep the tube and contact the Cortrak team at [email or web address] (password [email or web address]) for replacement.  If after hours and replacement cannot be delayed, place a NG tube and confirm placement with an abdominal x-ray.    Koleen Distance MS, RD, LDN Please refer to [facility] for RD and/or RD on-call/weekend/after hours pager

## 2020-01-29 NOTE — Progress Notes (Addendum)
Pt down for swallow study per bed at this time. Pt request to take cell phone with her.   1000: Pt arrived back on unit per bed. Pt stable.

## 2020-01-29 NOTE — Progress Notes (Signed)
Patient ID: Erika Stanton, female   DOB: 1955-08-19, 64 y.o.   MRN: [id number]   SW received phone call from [name] [phone]) to ask who he can contact in neurology to discuss patient wife medical condition. SW made efforts to explain process on typical outpatient follow-up with neurology. Husband expressed discontent as he would like follow-up with someone with neurology to explain his wife's medical condition. SW encouraged pt to speak with attending regarding his medical concerns. SW informed attending and PA on husband concerns as well.   Loralee Pacas, MSW, [place] Office: [phone] Cell: [phone] Fax: [phone]

## 2020-01-30 ENCOUNTER — Inpatient Hospital Stay (HOSPITAL_COMMUNITY): Payer: BC Managed Care – PPO | Admitting: Physical Therapy

## 2020-01-30 ENCOUNTER — Inpatient Hospital Stay (HOSPITAL_COMMUNITY): Payer: BC Managed Care – PPO

## 2020-01-30 LAB — URINALYSIS, ROUTINE W REFLEX MICROSCOPIC
Bilirubin Urine: NEGATIVE
Glucose, UA: NEGATIVE mg/dL
Hgb urine dipstick: NEGATIVE
Ketones, ur: NEGATIVE mg/dL
Leukocytes,Ua: NEGATIVE
Nitrite: NEGATIVE
Protein, ur: NEGATIVE mg/dL
Specific Gravity, Urine: 1.02 (ref 1.005–1.030)
pH: 5 (ref 5.0–8.0)

## 2020-01-30 LAB — CBC
HCT: 36.1 % (ref 36.0–46.0)
Hemoglobin: 11.6 g/dL — ABNORMAL LOW (ref 12.0–15.0)
MCH: 31.6 pg (ref 26.0–34.0)
MCHC: 32.1 g/dL (ref 30.0–36.0)
MCV: 98.4 fL (ref 80.0–100.0)
Platelets: 398 10*3/uL (ref 150–400)
RBC: 3.67 MIL/uL — ABNORMAL LOW (ref 3.87–5.11)
RDW: 14.2 % (ref 11.5–15.5)
WBC: 7.4 10*3/uL (ref 4.0–10.5)
nRBC: 0 % (ref 0.0–0.2)

## 2020-01-30 LAB — GLUCOSE, CAPILLARY
Glucose-Capillary: 100 mg/dL — ABNORMAL HIGH (ref 70–99)
Glucose-Capillary: 127 mg/dL — ABNORMAL HIGH (ref 70–99)
Glucose-Capillary: 132 mg/dL — ABNORMAL HIGH (ref 70–99)
Glucose-Capillary: 142 mg/dL — ABNORMAL HIGH (ref 70–99)
Glucose-Capillary: 151 mg/dL — ABNORMAL HIGH (ref 70–99)
Glucose-Capillary: 159 mg/dL — ABNORMAL HIGH (ref 70–99)

## 2020-01-30 MED ORDER — METHYLPHENIDATE HCL 5 MG PO TABS
5.0000 mg | ORAL_TABLET | Freq: Two times a day (BID) | ORAL | Status: DC
  Administered 2020-01-31 – 2020-02-15 (×31): 5 mg via NASOGASTRIC
  Filled 2020-01-30 (×31): qty 1

## 2020-01-30 NOTE — Progress Notes (Signed)
Pt. reports she is having hallucinations of a little girl laying in bed with her on her left side. Pt denies pain or discomfort. Bladder scan show 62ml and pt is afebrile. Dr. Read Drivers notified. New order for UA.

## 2020-01-30 NOTE — Progress Notes (Signed)
Occupational Therapy Session Note  Patient Details  Name: Erika Stanton MRN: [id number] Date of Birth: [birth year]  Today's Date: 01/30/2020 OT Individual Time: 1345-1440 OT Individual Time Calculation (min): 55 min    Short Term Goals: Week 1:  OT Short Term Goal 1 (Week 1): patient will roll in bed with min A, move from side lying to sitting edge of bed with mod A of one OT Short Term Goal 1 - Progress (Week 1): Met OT Short Term Goal 2 (Week 1): patient will tolerate unsupported sitting with min A for 10 minutes with head midline OT Short Term Goal 2 - Progress (Week 1): Met OT Short Term Goal 3 (Week 1): patient will use left UE to wash 25% of upper body and assist with donning [place] shirt max A OT Short Term Goal 3 - Progress (Week 1): Progressing toward goal OT Short Term Goal 4 (Week 1): patient will scan environment both right and left to locate items with moderate cues OT Short Term Goal 4 - Progress (Week 1): Met  Skilled Therapeutic Interventions/Progress Updates:    1:1. Pt received in bed agreeable to OT with encouragemetn. Pt requires mod (closed chain transfer to L)-max A (open chain to R) of 1 caregiver to stand pivot EOB<>w/c<>EOM throughout session with VC for hand palcement. Pt completes seated EOM activities targeting deep proprioceptive input into R elbow, L reaching (HOH A) to cross midline locating clothes pin, turn/manipulate into position with MAX A, and reach up to basketball hoop on L to place 10 pins. Pt requires 1 leaning rest break onto OT and MAX encouragement to partiicpate d/t low frustration tolerance. Pt completes UE ranger seated for shoulder flex/ext, elbow flex/ext, int/ext rotation, and horizontal ab/adduct with good activation throughout in gravity eliminated position. Exited session with pt seated in bed, exit alarm ona and call lightin reach  Therapy Documentation Precautions:  Precautions Precautions: Fall Precaution Comments:  NPO Restrictions Weight Bearing Restrictions: No General:   Vital Signs:  Pain: Pain Assessment Pain Scale: 0-10 Pain Score: 0-No pain ADL: ADL Eating: NPO Grooming: Dependent Where Assessed-Grooming: Bed level Upper Body Bathing: Dependent Where Assessed-Upper Body Bathing: Bed level Lower Body Bathing: Dependent Where Assessed-Lower Body Bathing: Bed level Upper Body Dressing: Dependent Where Assessed-Upper Body Dressing: Edge of bed Lower Body Dressing: Maximal assistance, Dependent Where Assessed-Lower Body Dressing: Bed level Toileting: Dependent Where Assessed-Toileting: Bed level ADL Comments: patient able to hold wash cloth with left hand but unable to manipulate for washing, she is able to lift each leg for donning pants/socks Vision   Perception    Praxis   Exercises:   Other Treatments:     Therapy/Group: Individual Therapy  Tonny Branch 01/30/2020, 12:59 PM

## 2020-01-30 NOTE — Progress Notes (Signed)
Patient alert and oriented x4. LBM 01/26/2020. Pt educated on the risk and benefits and the importance of having regular bowel movements. Pt refused suppository at this time. Will continue to educate patient.

## 2020-01-30 NOTE — Progress Notes (Addendum)
[facility] PHYSICAL MEDICINE & REHABILITATION PROGRESS NOTE   Subjective/Complaints:  Some visual illusions  ROS: Patient denies CP, SOB, N/V/D Objective:   No results found. Recent Labs    01/29/20 0429 01/30/20 0351  WBC 6.8 7.4  HGB 11.1* 11.6*  HCT 33.8* 36.1  PLT 421* 398   Recent Labs    01/28/20 0330 01/29/20 0429  NA 136 137  K 3.6 3.4*  CL 99 101  CO2 27 26  GLUCOSE 98 121*  BUN 18 12  CREATININE 0.52 0.49  CALCIUM 9.2 9.0    Intake/Output Summary (Last 24 hours) at 01/30/2020 1052 Last data filed at 01/30/2020 0600 Gross per 24 hour  Intake 1634.13 ml  Output 2200 ml  Net -565.87 ml     Physical Exam: Vital Signs Blood pressure (!) 142/68, pulse 92, temperature 98.7 F (37.1 C), temperature source Oral, resp. rate 18, height 5\' 6"  (1.676 m), weight 89.5 kg, SpO2 99 %.    General: No acute distress Mood and affect are appropriate Heart: Regular rate and rhythm no rubs murmurs or extra sounds Lungs: Clear to auscultation, breathing unlabored, no rales or wheezes Abdomen: Positive bowel sounds, soft nontender to palpation, nondistended Extremities: No clubbing, cyanosis, or edema Skin: No evidence of breakdown, no evidence of rash   Neurologic: Right triceps 2- o/w 0/5 RUE, RLE 0/5, LUE 3/5 , LLE 4/5 Musculoskeletal: No pain with AROM/PROM BUE and BLE . No joint swelling  Skin: numerous bruises along limbs and abdomen which appear stable  Assessment/Plan: 1. 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  Physiatrist is providing close team supervision and 24 hour management of active medical problems listed below.  Physiatrist and rehab team continue to assess barriers to discharge/monitor patient progress toward functional and medical goals  Care Tool:  Bathing        Body parts bathed by helper: Front  perineal area, Buttocks, Right upper leg, Left upper leg, Right lower leg, Left lower leg (LB only this session)     Bathing assist Assist Level: Dependent - Patient 0%     Upper Body Dressing/Undressing Upper body dressing   What is the patient wearing?: Pull over shirt    Upper body assist Assist Level: Dependent - Patient 0%    Lower Body Dressing/Undressing Lower body dressing      What is the patient wearing?: Pants     Lower body assist Assist for lower body dressing: Total Assistance - Patient < 25%     Toileting Toileting    Toileting assist Assist for toileting: Dependent - Patient 0%     Transfers Chair/bed transfer  Transfers assist     Chair/bed transfer assist level: 2 Helpers     Locomotion Ambulation   Ambulation assist      Assist level: 2 helpers Assistive device:  (3 muskateers) Max distance: 12 ft   Walk 10 feet activity   Assist     Assist level: 2 helpers Assistive device: Other (comment) (swedish walker)   Walk 50 feet activity   Assist Walk 50 feet with 2 turns activity did not occur: Safety/medical concerns         Walk 150 feet activity   Assist Walk 150 feet activity did not occur: Safety/medical concerns         Walk 10 feet on uneven surface  activity   Assist Walk 10 feet on  uneven surfaces activity did not occur: Safety/medical concerns         Wheelchair     Assist Will patient use wheelchair at discharge?: Yes Type of Wheelchair: Manual           Wheelchair 50 feet with 2 turns activity    Assist    Wheelchair 50 feet with 2 turns activity did not occur: Safety/medical concerns (requires TIS for postural support)       Wheelchair 150 feet activity     Assist  Wheelchair 150 feet activity did not occur: Safety/medical concerns       Blood pressure (!) 142/68, pulse 92, temperature 98.7 F (37.1 C), temperature source Oral, resp. rate 18, height 5\' 6"  (1.676 m),  weight 89.5 kg, SpO2 99 %.  Medical Problem List and Plan: 1.  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  Continue CIR, PT, OT, SLP      2.  Antithrombotics: -DVT/anticoagulation:  Pharmaceutical: Heparin--hold                          Dopplers negative for DVT.              -antiplatelet therapy: DAPT--resume plavix, asa   3. Pain Management: Tylenol prn.  Appears to be well controlled.  4. Mood: LCSW to follow for evaluation and support when appropriate.              -antipsychotic agents: N/A 5. Neuropsych: This patient is not capable of making decisions on her own behalf.  Continue trial of ritalin for arousal/stimulation--increased to 10mg  9/13   -arousal and mentation gradually improving 6. Skin/Wound Care: routine pressure relief measures.  7. Fluids/Electrolytes/Nutrition: NPO       -9/17 BUN/Cr stable to improved  -currently on d5 1/2ns at 75cc/hr    -await results of MBS before we replace cortrak  -hypokalemia:  replacing potassium (add to IVF for now)  8. ABLA/UGIB:     EGD 9/15 apparently demonstrated non-bleeding esophagitis/gastritis, HH   -husband doesn't want to pursue further testing  9/17: resume plavix/asa today. Will not resume sq heparin    -protonix per tube now (or IV if needed today)   -GI consult appreciated!   -hgb holding at 11.1 after 2u PRBC   -serial CBC's 9. 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 Oral care qid.  -will replace with regular NGT today.  11. Resting tachycardia:              has been ranging from 90's to 100's 12. Vasospasm prevention: To continue Nimotop for 3 additional days.  13. Bowel incontinence/loose stool: multifactorial, on abx for uti also  -  fiber/probiotic  -stools ceased d/t stopping TF over last 48 hours  -9/13 added  cholestyramine TID  - see #8 14. Proteus UTI: Ceftriaxone course completed  -f/u ua negative 18.  Prediabetes, confounded by tube feeds  Relatively controlled on 9/17  Continue to monitor 19.  Leukoctyosis: resolved  -UA negative , procalcitonin wnl  - cxr clear  -likely reactive related to GIB      LOS: 10 days A FACE TO FACE EVALUATION WAS PERFORMED  Charlett Blake 01/30/2020, 10:52 AM

## 2020-01-30 NOTE — Progress Notes (Signed)
Physical Therapy Session Note  Patient Details  Name: Erika Stanton MRN: [id number] Date of Birth: 1955/05/31  Today's Date: 01/30/2020 PT Individual Time: 1002-1055 PT Individual Time Calculation (min): 53 min   Short Term Goals: Week 2:  PT Short Term Goal 1 (Week 2): Pt will complete bed<>w/c with max assist +1 consistently. PT Short Term Goal 2 (Week 2): Pt will demonstrate static sitting balance x 5 minutes with min assist. PT Short Term Goal 3 (Week 2): Pt will consistently complete bed mobility with max assist +1.  Skilled Therapeutic Interventions/Progress Updates: Pt presents supine in bed and agreeable to therapy.  PT noted feeding tube had separated and nursing notified, removed for therapy session.  Pt was incontinent of urine in brief and tube feedings had soiled gown.  Pt performed rolling side to side w/ max A using siderails to remove and perform pericare.  Pt required total A to move RUE.  Pt required total A to thread LEs through pant legs, although she lifts BLEs on command to put them in pants.  Pt able to bridge to pull pants over hips w/ manual A to maintain LEs in hooklying position.  Pt rolls to R sidelying w/ max A and then able to bring LEs off SOB and max A to sitting EOB.  Pt able to maintain balance w/o assist although w/ a retro bias for liftng LEs for PT to don slipper socks, but doesn't fall back.  Pt performed sit to stand transfer w/ A of 1 and then step-pivot to TIS w/c w/ max A and facilitation for weight shift to advance feet.  Pt wheeled to gym for time conservation.  Pt performed SPT w/c <> mat table for therapy.  Pt sat EOM reaching across midline and out of BOS w/ LUE while PT facilitating weight-bearing through R hand/elbow.  Pt performed multiple transitions to down on elbow/forearm and then back to midline w/ mod A.  Pt reports "feels good" to weightbear.  Pt returned to room and requesting to go BTB.  Pt performed SPT w/c > bed w/ max A and weight shifting  to advance feet.  Pt performed sit tosidelying and then supine w/ max A.  Bed alarm on and all needs in reach.  Notified nursing that pt is back in bed for tube feedings.     Therapy Documentation Precautions:  Precautions Precautions: Fall Precaution Comments: NPO Restrictions Weight Bearing Restrictions: No General:   Vital Signs:  Pain: c/o pain to R hand/arm but unable to quantify Pain Assessment Pain Scale: 0-10 Pain Score: 0-No pain Faces Pain Scale: No hurt Mobility:      Therapy/Group: Individual Therapy  Ladoris Gene 01/30/2020, 10:57 AM

## 2020-01-30 NOTE — Progress Notes (Signed)
Speech Language Pathology Daily Session Note  Patient Details  Name: Erika Stanton MRN: [id number] Date of Birth: [birth year]  Today's Date: 01/30/2020 SLP Individual Time: 0800-0830 SLP Individual Time Calculation (min): 30 min  Short Term Goals: Week 2: SLP Short Term Goal 1 (Week 2): Patient will increase speech intelligibility to ~75% at the phrase level with Min A multimodal cues for use of speech intelligibility strategies. SLP Short Term Goal 2 (Week 2): Patient will attend to a functional task for ~10 minutes with Max A multimodal cues. SLP Short Term Goal 3 (Week 2): Patient will consume current diet with minimal overt s/s of aspiration and overall Mod A verbal cues for use of swallowing compensatory strategies. SLP Short Term Goal 4 (Week 2): Patient will conusme trials of thin liquids via tsp without overt s/s of aspiraiton over 3 sessions with Mod verbla cues to assess readiness for repeat MBS.  Skilled Therapeutic Interventions: Skilled SLP intervention focused on dysphagia. Pt fed by SLP with Dys 1 breakfast tray and NTL this session. PO intake was minimal with pt consuming 2 half teaspoons of pureed pancake, 1/2 tsp of cream of wheat and 2 tsp of vanilla pudding. Pt requested frequent breaks and and reported slight discomfort during swallow. Pt refused any further po trials stating she felt "very full." Educated pt on importance of po intake for NG tube removal. Pt tolerated trials completed this session with no overt s/sx of aspiration or penetration. RN tech notified at end of session to continue with feeding pt at end of session and informed of decreased appetite. Pt left in  bed with call bell and needs within reach. Bed alarm set. Cont with therapy per plan of care.      Pain Pain Assessment Pain Scale: Faces Faces Pain Scale: No hurt  Therapy/Group: Individual Therapy  Darrol Poke [place] 01/30/2020, 8:59 AM

## 2020-01-31 ENCOUNTER — Inpatient Hospital Stay (HOSPITAL_COMMUNITY): Payer: BC Managed Care – PPO | Admitting: Speech Pathology

## 2020-01-31 LAB — CBC
HCT: 35 % — ABNORMAL LOW (ref 36.0–46.0)
Hemoglobin: 10.9 g/dL — ABNORMAL LOW (ref 12.0–15.0)
MCH: 30.8 pg (ref 26.0–34.0)
MCHC: 31.1 g/dL (ref 30.0–36.0)
MCV: 98.9 fL (ref 80.0–100.0)
Platelets: 371 10*3/uL (ref 150–400)
RBC: 3.54 MIL/uL — ABNORMAL LOW (ref 3.87–5.11)
RDW: 14 % (ref 11.5–15.5)
WBC: 6.7 10*3/uL (ref 4.0–10.5)
nRBC: 0 % (ref 0.0–0.2)

## 2020-01-31 LAB — GLUCOSE, CAPILLARY
Glucose-Capillary: 105 mg/dL — ABNORMAL HIGH (ref 70–99)
Glucose-Capillary: 124 mg/dL — ABNORMAL HIGH (ref 70–99)
Glucose-Capillary: 138 mg/dL — ABNORMAL HIGH (ref 70–99)
Glucose-Capillary: 131 mg/dL — ABNORMAL HIGH (ref 70–99)
Glucose-Capillary: 122 mg/dL — ABNORMAL HIGH (ref 70–99)
Glucose-Capillary: 120 mg/dL — ABNORMAL HIGH (ref 70–99)

## 2020-01-31 NOTE — Progress Notes (Signed)
Speech Language Pathology Daily Session Note  Patient Details  Name: Erika Stanton MRN: [id number] Date of Birth: 14-Jan-1956  Today's Date: 01/31/2020 SLP Individual Time: 31-1530 SLP Individual Time Calculation (min): 55 min  Short Term Goals: Week 2: SLP Short Term Goal 1 (Week 2): Patient will increase speech intelligibility to ~75% at the phrase level with Min A multimodal cues for use of speech intelligibility strategies. SLP Short Term Goal 2 (Week 2): Patient will attend to a functional task for ~10 minutes with Max A multimodal cues. SLP Short Term Goal 3 (Week 2): Patient will consume current diet with minimal overt s/s of aspiration and overall Mod A verbal cues for use of swallowing compensatory strategies. SLP Short Term Goal 4 (Week 2): Patient will conusme trials of thin liquids via tsp without overt s/s of aspiraiton over 3 sessions with Mod verbla cues to assess readiness for repeat MBS.  Skilled Therapeutic Interventions:  Pt was seen for skilled ST targeting speech intelligibility goals.  Upon arrival pt was in bed, poorly positioned with complaints of discomfort.  Pt repositioned to sitting upright, propped up on pillows with reports of improved comfort.  Pt politely declined POs as she reported feeling full but was otherwise agreeable to participating in treatment.  SLP attempted two different cognitive tasks to address visual scanning and attention to task; however, pt was limited by motor weakness in both tasks even with task simplifications and they were abandoned in favor of speech intelligibility practice.  During therapeutic conversations with therapist, pt needed mod verbal cues for increased vocal intensity to achieve intelligibility at the phrase level.  Vocal quality is harsh and strained with pt reporting sensation of tension when speaking.  SLP introduced humming techniques to reduce tension in the hopes of reducing vocal strain.  Pt able to briefly return  demonstration of techniques with max assist instructional cues.  Pt was left in bed with bed alarm set and call bell within reach.    Pain Pain Assessment Pain Scale: 0-10 Pain Score: 0-No pain  Therapy/Group: Individual Therapy  Raford Brissett, Selinda Orion 01/31/2020, 4:51 PM

## 2020-02-01 ENCOUNTER — Inpatient Hospital Stay (HOSPITAL_COMMUNITY): Payer: BC Managed Care – PPO | Admitting: Occupational Therapy

## 2020-02-01 ENCOUNTER — Inpatient Hospital Stay (HOSPITAL_COMMUNITY): Payer: BC Managed Care – PPO | Admitting: Speech Pathology

## 2020-02-01 ENCOUNTER — Inpatient Hospital Stay (HOSPITAL_COMMUNITY): Payer: BC Managed Care – PPO

## 2020-02-01 LAB — BASIC METABOLIC PANEL
Anion gap: 9 (ref 5–15)
BUN: 18 mg/dL (ref 8–23)
CO2: 27 mmol/L (ref 22–32)
Calcium: 8.9 mg/dL (ref 8.9–10.3)
Chloride: 101 mmol/L (ref 98–111)
Creatinine, Ser: 0.49 mg/dL (ref 0.44–1.00)
GFR calc Af Amer: >60 mL/min (ref >60)
GFR calc non Af Amer: >60 mL/min (ref >60)
Glucose, Bld: 108 mg/dL — ABNORMAL HIGH (ref 70–99)
Potassium: 4.3 mmol/L (ref 3.5–5.1)
Sodium: 137 mmol/L (ref 135–145)

## 2020-02-01 LAB — URINE CULTURE: Culture: >=100000 — AB

## 2020-02-01 LAB — CBC
HCT: 36.6 % (ref 36.0–46.0)
Hemoglobin: 11.7 g/dL — ABNORMAL LOW (ref 12.0–15.0)
MCH: 31.4 pg (ref 26.0–34.0)
MCHC: 32 g/dL (ref 30.0–36.0)
MCV: 98.1 fL (ref 80.0–100.0)
Platelets: 394 10*3/uL (ref 150–400)
RBC: 3.73 MIL/uL — ABNORMAL LOW (ref 3.87–5.11)
RDW: 13.9 % (ref 11.5–15.5)
WBC: 6.8 10*3/uL (ref 4.0–10.5)
nRBC: 0 % (ref 0.0–0.2)

## 2020-02-01 LAB — GLUCOSE, CAPILLARY
Glucose-Capillary: 107 mg/dL — ABNORMAL HIGH (ref 70–99)
Glucose-Capillary: 120 mg/dL — ABNORMAL HIGH (ref 70–99)
Glucose-Capillary: 124 mg/dL — ABNORMAL HIGH (ref 70–99)
Glucose-Capillary: 145 mg/dL — ABNORMAL HIGH (ref 70–99)
Glucose-Capillary: 79 mg/dL (ref 70–99)
Glucose-Capillary: 83 mg/dL (ref 70–99)

## 2020-02-01 MED ORDER — OSMOLITE 1.5 CAL PO LIQD: 1000.0000 mL | ORAL | Status: DC

## 2020-02-01 MED ORDER — AMOXICILLIN 250 MG/5ML PO SUSR
250.0000 mg | Freq: Three times a day (TID) | ORAL | Status: DC
  Administered 2020-02-01 – 2020-02-04 (×9): 250 mg
  Filled 2020-02-01 (×10): qty 5

## 2020-02-01 MED ORDER — OSMOLITE 1.5 CAL PO LIQD
1000.0000 mL | ORAL | Status: DC
  Administered 2020-02-01 – 2020-02-02 (×2): 1000 mL
  Filled 2020-02-01: qty 1000

## 2020-02-01 MED ORDER — PROSOURCE TF PO LIQD
45.0000 mL | Freq: Three times a day (TID) | ORAL | Status: DC
  Administered 2020-02-01 – 2020-02-03 (×8): 45 mL
  Filled 2020-02-01 (×8): qty 45

## 2020-02-01 NOTE — Progress Notes (Signed)
Nutrition Follow-up  DOCUMENTATION CODES:   Not applicable  INTERVENTION:   Transition to nocturnal tube feeds: - Osmolite 1.5 @ 45 ml/hr to run for 12 hours from 1800 to 0600 (total of 540 ml) - ProSource TF 45 ml TID - Free water per MD/PA, currently 200 ml q 6 hours  Nocturnal tube feeding regimen with free water flushes provides 930 kcal, 67 grams of protein, and 1211 ml of H2O (meets 49% of kcal and 71% of protein needs).  - Vital Cuisine Shake TID, each supplement provides 520 kcal and 22 grams of protein  NUTRITION DIAGNOSIS:   Inadequate oral intake related to dysphagia, lethargy/confusion as evidenced by meal completion < 50%.  Ongoing, pt on dysphagia 1 diet with nectar-thick liquids but consuming 0-25%  GOAL:   Patient will meet greater than or equal to 90% of their needs  Progressing  MONITOR:   PO intake, Supplement acceptance, Diet advancement, Labs, Weight trends, TF tolerance, I & O's  REASON FOR ASSESSMENT:   Consult Enteral/tube feeding initiation and management  ASSESSMENT:   64 year old female who was admitted on 01/05/20 with right-sided headache and left hemiparesis. 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 Pt remains NPO due to signs of aspiration with tube feeds via Cortrak. Admitted to CIR on 9/08.  9/15 - s/p EGD with findings of non-bleeding reflux esophagitis, Cortrak removed 9/17 - MBS with recommendations for dysphagia 1 diet with nectar-thick liquids, Cortrak replaced, tip gastric per Cortrak team  Spoke with pt and husband at bedside. Discussed plan to transition pt to nocturnal TF  to promote PO intake during the day. Pt and husband agreeable. All questions answered. Also discussed plan with RN.  Pt amenable to trying thickened shake with meals. RD to order via [facility].  RD ordered tube feeds of Osmolite 1.5 @ 80 ml/hr to run for 12 hours to meet ~75% of estimated needs given poor PO intake. However, MD ordered tube feeds to be changed to Osmolite 1.5 @ 45 ml/hr to run for 12 hours.  No new weights since 01/20/20.  Current TF: Osmolite 1.5 @ 65 ml/hr, ProSource TF 45 ml BID, free water 200 ml q 6 hours  Meal Completion: 0-25%  Medications reviewed and include: Questran TID, SSI q 4 hours, ritalin, protonix, fibercon, KCl 40 mEq daily, florastor  Labs reviewed. CBG's: 107-145 x 24 hours  UOP: 1800 ml x 24 hours  Diet Order:   Diet Order            DIET - DYS 1 Room service appropriate? Yes; Fluid consistency: Nectar Thick  Diet effective now                 EDUCATION NEEDS:   No education needs have been identified at this time  Skin:  Skin Assessment: Reviewed RN Assessment  Last BM:  02/01/20 type 7  Height:   Ht Readings from Last 1 Encounters:  01/20/20 5\' 6"  (1.676 m)    Weight:   Wt Readings from Last 1 Encounters:  01/20/20 89.5 kg    Ideal Body Weight:  59.1 kg  BMI:  Body mass index is 31.85 kg/m.  Estimated Nutritional Needs:  Kcal:  1900-2100  Protein:  95-115 grams  Fluid:  >/= 1.9 L    Gaynell Face, MS, RD, LDN Inpatient Clinical Dietitian Please see [facility] for contact information.

## 2020-02-01 NOTE — Progress Notes (Signed)
Speech Language Pathology Daily Session Note  Patient Details  Name: Erika Stanton MRN: [id number] Date of Birth: December 27, 1955  Today's Date: 02/01/2020 SLP Individual Time: 0700-0745 SLP Individual Time Calculation (min): 45 min  Short Term Goals: Week 2: SLP Short Term Goal 1 (Week 2): Patient will increase speech intelligibility to ~75% at the phrase level with Min A multimodal cues for use of speech intelligibility strategies. SLP Short Term Goal 2 (Week 2): Patient will attend to a functional task for ~10 minutes with Max A multimodal cues. SLP Short Term Goal 3 (Week 2): Patient will consume current diet with minimal overt s/s of aspiration and overall Mod A verbal cues for use of swallowing compensatory strategies. SLP Short Term Goal 4 (Week 2): Patient will conusme trials of thin liquids via tsp without overt s/s of aspiraiton over 3 sessions with Mod verbla cues to assess readiness for repeat MBS.  Skilled Therapeutic Interventions: Skilled treatment session focused on cognitive, speech and swallowing goals. Upon arrival, patient was asleep but easily awakened. Patient consumed minimal amounts of breakfast meal of Dys. 1 textures with nectar-thick liquids without overt s/s of aspiration. Max encouragement needed for PO intake with patient requesting frequent rest breaks. Patient also asked this clinician if she had eaten 50% of her meal after only 2 bites. Recommend current diet with full supervision. Patient was ~75% intelligible with overall Min A verbal cues needed for use of speech intelligibility strategies at the phrase and Max verbal cues needed for sustained attention to tasks. Patient left upright in bed with alarm on and all needs within reach. Continue with current plan of care.      Pain No/Denies Pain   Therapy/Group: Individual Therapy  Prinston Kynard 02/01/2020, 12:03 PM

## 2020-02-01 NOTE — Progress Notes (Signed)
Occupational Therapy Session Note  Patient Details  Name: Erika Stanton MRN: [id number] Date of Birth: 12-05-1955  Today's Date: 02/01/2020 OT Individual Time: 0756-0900   &   1300-1330 OT Individual Time Calculation (min): 64 min   &   30 min   Short Term Goals: Week 2:  OT Short Term Goal 1 (Week 2): Pt will use L hand with mod A to wash 50 % of her UB. OT Short Term Goal 2 (Week 2): Pt will be able to sit upright on  a BSC with min A. OT Short Term Goal 3 (Week 2): Pt will be able to squat pivot with mod A of 2 to a BSC. OT Short Term Goal 4 (Week 2): Pt will be able to sit to stand with mod A of 2 to increase skills needed for LB dressing.  Skilled Therapeutic Interventions/Progress Updates:    AM session:   Patient in bed, alert and aware of date.  Improved midline orientation in supine, seated and standing positions noted.  She denies pain.  LB dressing completed bed level with max A pants, dependent footwear.  CM in stance mod A.  Supine to sitting edge of bed with min A.  Sit to stand and SPT bed to w/c min/mod A.  Oral care completed seated in w/c at sink - max A due to limited left hand dexterity.  She is able to reach to mouth to wash face.  Max A for PO intake of thickened juice and yogurt via tsp.   SPT to mat table min/mod A - she tolerates unsupported sitting for 25 minutes with focus on posture, seated balance, visual scanning of environment, functional reach with left UE, weight bearing bilateral UEs, trunk rotation, LB AROM.  Note left UE shoulder 1/4 - 1/2 against gravity, full elbow, wrist and hand, right 1/4 gravity eliminated shoulder, full elbow, trace wrist/hand.  SPT back to bed at close of session for rest break with min/mod A.  Sit to supine min A.  HOB >30, call bell in hand, bed alarm set.     PM session:   Patient in bed, alert with NT for assistance for lunch meal.  She denies pain.  Supine to sitting edge of bed with min A.  Able to maintain sitting with CS, cues  for upright posture.  Attempted to hold spoon in left hand for eating but unable to manipulate to mouth - overall dependent for scooping and spoon to mouth (poor PO intake due to refusal - she states that she is full &/or thickened consistency is too difficult to swallow - no s/s of aspiration)   Completed sit to stand from edge of bed x 2 with min/mod A.  AAROM right shoulder/elbow.  Min A for sit to supine.  She remained in bed at close of session, bed alarm set and call bell in hand.      Therapy Documentation Precautions:  Precautions Precautions: Fall Precaution Comments: NPO Restrictions Weight Bearing Restrictions: No   Therapy/Group: Individual Therapy  Carlos Levering 02/01/2020, 7:30 AM

## 2020-02-01 NOTE — Progress Notes (Addendum)
[facility] PHYSICAL MEDICINE & REHABILITATION PROGRESS NOTE   Subjective/Complaints: Up with therapy early today. No new issue per se. Still can be a little restless and irritable at times  ROS: Limited due to cognitive/behavioral    Objective:   No results found. Recent Labs    01/31/20 0404 02/01/20 0438  WBC 6.7 6.8  HGB 10.9* 11.7*  HCT 35.0* 36.6  PLT 371 394   Recent Labs    02/01/20 0438  NA 137  K 4.3  CL 101  CO2 27  GLUCOSE 108*  BUN 18  CREATININE 0.49  CALCIUM 8.9    Intake/Output Summary (Last 24 hours) at 02/01/2020 1154 Last data filed at 02/01/2020 1101 Gross per 24 hour  Intake 155 ml  Output 1801 ml  Net -1646 ml     Physical Exam: Vital Signs Blood pressure 126/67, pulse 93, temperature 98.3 F (36.8 C), resp. rate 18, height 5\' 6"  (1.676 m), weight 89.5 kg, SpO2 100 %.    Constitutional: No distress . Vital signs reviewed. HEENT: EOMI, oral membranes moist Neck: supple Cardiovascular: RRR without murmur. No JVD, NGT  Respiratory/Chest: CTA Bilaterally without wheezes or rales. Normal effort    GI/Abdomen: BS +, non-tender, non-distended Ext: no clubbing, cyanosis, or edema Psych: pleasant and cooperative Neurologic: Right triceps 2- o/w 0/5 RUE, RLE 0/5, LUE 3/5 , LLE 4/5 Musculoskeletal: No pain with AROM/PROM BUE and BLE . No joint swelling  Skin: numerous bruises along limbs and abdomen which appear stable  Assessment/Plan: 1. 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  Physiatrist is providing close team supervision and 24 hour management of active medical problems listed below.  Physiatrist and rehab team continue to assess barriers to discharge/monitor patient progress toward functional and medical goals  Care Tool:  Bathing        Body parts bathed by helper: Front perineal area,  Buttocks, Right upper leg, Left upper leg, Right lower leg, Left lower leg (LB only this session)     Bathing assist Assist Level: Dependent - Patient 0%     Upper Body Dressing/Undressing Upper body dressing   What is the patient wearing?: Pull over shirt    Upper body assist Assist Level: Dependent - Patient 0%    Lower Body Dressing/Undressing Lower body dressing      What is the patient wearing?: Pants     Lower body assist Assist for lower body dressing: Total Assistance - Patient < 25%     Toileting Toileting    Toileting assist Assist for toileting: Dependent - Patient 0%     Transfers Chair/bed transfer  Transfers assist     Chair/bed transfer assist level: Maximal Assistance - Patient 25 - 49%     Locomotion Ambulation   Ambulation assist      Assist level: 2 helpers Assistive device:  (3 muskateers) Max distance: 12 ft   Walk 10 feet activity   Assist     Assist level: 2 helpers Assistive device: Other (comment) (swedish walker)   Walk 50 feet activity   Assist Walk 50 feet with 2 turns activity did not occur: Safety/medical concerns         Walk 150 feet activity   Assist Walk 150 feet activity did not occur: Safety/medical concerns         Walk 10 feet on uneven surface  activity   Assist Walk 10  feet on uneven surfaces activity did not occur: Safety/medical concerns         Wheelchair     Assist Will patient use wheelchair at discharge?: Yes Type of Wheelchair: Manual           Wheelchair 50 feet with 2 turns activity    Assist    Wheelchair 50 feet with 2 turns activity did not occur: Safety/medical concerns (requires TIS for postural support)       Wheelchair 150 feet activity     Assist  Wheelchair 150 feet activity did not occur: Safety/medical concerns       Blood pressure 126/67, pulse 93, temperature 98.3 F (36.8 C), resp. rate 18, height 5\' 6"  (1.676 m), weight 89.5 kg,  SpO2 100 %.  Medical Problem List and Plan: 1.  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  Continue CIR, PT, OT, SLP      2.  Antithrombotics: -DVT/anticoagulation:  Pharmaceutical: Heparin--hold                          Dopplers negative for DVT.              -antiplatelet therapy: DAPT--resumed plavix, asa     -hgb holding today 3. Pain Management: Tylenol prn.  Appears to be well controlled.  4. Mood: LCSW to follow for evaluation and support when appropriate.              -antipsychotic agents: N/A 5. Neuropsych: This patient is not capable of making decisions on her own behalf.  Continue trial of ritalin  for concentration and arousal    -will stay with 5mg  dose for now   -arousal and mentation gradually improving 6. Skin/Wound Care: routine pressure relief measures.  7. Fluids/Electrolytes/Nutrition: NPO       -9/20 continue TF, eating little right now  -h2o flushed  -off IVF 8. ABLA/UGIB:     EGD 9/15 apparently demonstrated non-bleeding esophagitis/gastritis, HH   -husband doesn't want to pursue further testing  9/17: resume plavix/asa today. Will not resume sq heparin    -protonix per tube now (or IV if needed today)   -GI consult appreciated!   -hgb holding at 11.7 again 9/20 after 2u PRBC   -recheck cbc Thursday 9. 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   -will reduce TF by half to help encourage intake 11. Resting tachycardia:              has been ranging from 90's to 100's 12. Vasospasm prevention: To continue Nimotop for 3 additional days.  13. Bowel incontinence/loose stool: multifactorial, on abx for uti also  -  fiber/probiotic  -9/13 added cholestyramine TID  - still liquid stool which I  assume is d/t TF 14. Proteus UTI: Ceftriaxone course completed  -f/u ua negative 18.  Prediabetes, confounded by tube feeds  Relatively controlled on 9/17  Continue to monitor 19.  Leukoctyosis: resolved  -UA negative , procalc itonin wnl  - cxr clear  -likely reactive related to GIB      LOS: 12 days A FACE TO FACE EVALUATION WAS PERFORMED  Meredith Staggers 02/01/2020, 11:54 AM

## 2020-02-01 NOTE — Progress Notes (Signed)
Physical Therapy Session Note  Patient Details  Name: Erika Stanton MRN: [id number] Date of Birth: Jun 07, 1955  Today's Date: 02/01/2020 PT Individual Time: 0254-2706 PT Individual Time Calculation (min): 56 min   Short Term Goals: Week 2:  PT Short Term Goal 1 (Week 2): Pt will complete bed<>w/c with max assist +1 consistently. PT Short Term Goal 2 (Week 2): Pt will demonstrate static sitting balance x 5 minutes with min assist. PT Short Term Goal 3 (Week 2): Pt will consistently complete bed mobility with max assist +1.  Skilled Therapeutic Interventions/Progress Updates:      Pt received supine in bed and agrees to therapy. No complaint of pain. Supine to sit with minA. Stand pivot transfer to TIS WC with min/modA at hips. WC transport to gym for time management. Pt ambulates 10' with modA +1 manual assist at hips and +2 WC follow. Additional bouts of 15'x2 with mirror positioned for visual feedback. PT provides tactile facilitation of pelvic rotation and lateral weight shifting. Pt repeatedly says she cannot perform additional activities but is agreeable with gentle encouragement.   Pt performs SPT to mat table with minA. From mat pt performs multiple reps of sit to stand and NMR for standing balance, using mirror for visual feedback, and reaching overhead with LUE for targets. Sit to stand requires minA each rep.   Pt ambulates 20' with multiple turns with modA and +2 WC follow. SPT to bed with minA. Sit to supine with minA and left with alarm intact and all needs within reach.  Therapy Documentation Precautions:  Precautions Precautions: Fall Precaution Comments: NPO Restrictions Weight Bearing Restrictions: No   Therapy/Group: Individual Therapy  Breck Coons, PT, DPT 02/01/2020, 3:44 PM

## 2020-02-02 ENCOUNTER — Inpatient Hospital Stay (HOSPITAL_COMMUNITY): Payer: BC Managed Care – PPO | Admitting: Physical Therapy

## 2020-02-02 ENCOUNTER — Inpatient Hospital Stay (HOSPITAL_COMMUNITY): Payer: BC Managed Care – PPO | Admitting: Occupational Therapy

## 2020-02-02 ENCOUNTER — Inpatient Hospital Stay (HOSPITAL_COMMUNITY): Payer: BC Managed Care – PPO | Admitting: Speech Pathology

## 2020-02-02 LAB — GLUCOSE, CAPILLARY
Glucose-Capillary: 117 mg/dL — ABNORMAL HIGH (ref 70–99)
Glucose-Capillary: 119 mg/dL — ABNORMAL HIGH (ref 70–99)
Glucose-Capillary: 130 mg/dL — ABNORMAL HIGH (ref 70–99)
Glucose-Capillary: 87 mg/dL (ref 70–99)
Glucose-Capillary: 89 mg/dL (ref 70–99)
Glucose-Capillary: 93 mg/dL (ref 70–99)

## 2020-02-02 MED ORDER — PANTOPRAZOLE SODIUM 40 MG PO PACK
40.0000 mg | PACK | Freq: Two times a day (BID) | ORAL | Status: DC
  Administered 2020-02-02 – 2020-02-04 (×5): 40 mg
  Filled 2020-02-02 (×2): qty 20

## 2020-02-02 MED ORDER — TAMSULOSIN HCL 0.4 MG PO CAPS
0.4000 mg | ORAL_CAPSULE | Freq: Every day | ORAL | Status: DC
  Administered 2020-02-02 – 2020-02-18 (×12): 0.4 mg via ORAL
  Filled 2020-02-02 (×13): qty 1

## 2020-02-02 NOTE — Progress Notes (Signed)
Patient ID: Erika Stanton, female   DOB: [birth year], 64 y.o.   MRN: [id number]  SW met with pt and pt husband in room to provide updates from team conference on gains made, and d/c date remains 10/12. SW informed will continue to provide updates.   Loralee Pacas, MSW, [place] Office: [phone] Cell: [phone] Fax: [phone]

## 2020-02-02 NOTE — Progress Notes (Signed)
Occupational Therapy Session Note  Patient Details  Name: Erika Stanton MRN: [id number] Date of Birth: [birth year]  Today's Date: 02/02/2020 OT Individual Time: 1000-1100 OT Individual Time Calculation (min): 60 min    Short Term Goals: Week 2:  OT Short Term Goal 1 (Week 2): Pt will use L hand with mod A to wash 50 % of her UB. OT Short Term Goal 2 (Week 2): Pt will be able to sit upright on  a BSC with min A. OT Short Term Goal 3 (Week 2): Pt will be able to squat pivot with mod A of 2 to a BSC. OT Short Term Goal 4 (Week 2): Pt will be able to sit to stand with mod A of 2 to increase skills needed for LB dressing.  Skilled Therapeutic Interventions/Progress Updates:    Patient in bed, alert and states that she would like to change her clothes.  She denies pain in this position.  Some c/o right UE pain during session relieved with repositioning.   Supine to sitting edge of bed with CGA.  She is able to maintain unsupported sitting with CS, cues for posture - mild kyphosis.  Dressing completed seated edge of bed, mod A for pants over feet and up to thighs, she is able to complete CM in stance with mod A.  UB dressing max A for OH shirt.  She is able to wash face with mod A.   SPT to/from mat table with min A.  Completed trunk mobility/posture and scapular mobility activities.  Bilateral UE NMRE with various supports and AAROM.  SPT back to bed at close of session with min A.  Sit to supine min A.  Completed bilateral shoulder PROM/AAROM and scapular mobility activities - improved shoulder ROM noted post inhibition.  She remained in bed at close of session.  Bed alarm set and call bell in hand.    Therapy Documentation Precautions:  Precautions Precautions: Fall Precaution Comments: NPO Restrictions Weight Bearing Restrictions: No  Therapy/Group: Individual Therapy  Carlos Levering 02/02/2020, 7:35 AM

## 2020-02-02 NOTE — Progress Notes (Signed)
[facility] PHYSICAL MEDICINE & REHABILITATION PROGRESS NOTE   Subjective/Complaints: Up in bed. SLP at bedside. Ate a little of breakfast. Still concerned about loose bm's although last was the night before essentially  ROS: Patient denies fever, rash, sore throat, blurred vision, nausea, vomiting, diarrhea, cough, shortness of breath or chest pain, joint or back pain, headache, or mood change.    Objective:   No results found. Recent Labs    01/31/20 0404 02/01/20 0438  WBC 6.7 6.8  HGB 10.9* 11.7*  HCT 35.0* 36.6  PLT 371 394   Recent Labs    02/01/20 0438  NA 137  K 4.3  CL 101  CO2 27  GLUCOSE 108*  BUN 18  CREATININE 0.49  CALCIUM 8.9    Intake/Output Summary (Last 24 hours) at 02/02/2020 1116 Last data filed at 02/02/2020 0650 Gross per 24 hour  Intake 145 ml  Output 2025 ml  Net -1880 ml     Physical Exam: Vital Signs Blood pressure 119/71, pulse 96, temperature 97.9 F (36.6 C), resp. rate 18, height 5\' 6"  (1.676 m), weight 89.5 kg, SpO2 95 %.    Constitutional: No distress . Vital signs reviewed. HEENT: EOMI, oral membranes moist Neck: supple Cardiovascular: RRR without murmur. No JVD    Respiratory/Chest: CTA Bilaterally without wheezes or rales. Normal effort    GI/Abdomen: BS +, non-tender, non-distended Ext: no clubbing, cyanosis, or edema Psych: pleasant and cooperative Neurologic: improved speech clarity and volume. Trace to 1/5 prox right UE, otherwise 0/5 RUE, RLE 0/5, LUE 3/5 , LLE 4/5 Musculoskeletal: No pain with AROM/PROM BUE and BLE . No joint swelling  Skin: numerous bruises along limbs and abdomen which appear less prominent  Assessment/Plan: 1. 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  Physiatrist is providing close team supervision and 24 hour management of active medical problems listed  below.  Physiatrist and rehab team continue to assess barriers to discharge/monitor patient progress toward functional and medical goals  Care Tool:  Bathing        Body parts bathed by helper: Front perineal area, Buttocks, Right upper leg, Left upper leg, Right lower leg, Left lower leg (LB only this session)     Bathing assist Assist Level: Dependent - Patient 0%     Upper Body Dressing/Undressing Upper body dressing   What is the patient wearing?: Pull over shirt    Upper body assist Assist Level: Dependent - Patient 0%    Lower Body Dressing/Undressing Lower body dressing      What is the patient wearing?: Pants     Lower body assist Assist for lower body dressing: Total Assistance - Patient < 25%     Toileting Toileting    Toileting assist Assist for toileting: Dependent - Patient 0%     Transfers Chair/bed transfer  Transfers assist     Chair/bed transfer assist level: Moderate Assistance - Patient 50 - 74%     Locomotion Ambulation   Ambulation assist      Assist level: 2 helpers Assistive device: No Device (WC follow) Max distance: 20'   Walk 10 feet activity   Assist     Assist level: 2 helpers Assistive device: Other (comment)   Walk 50 feet activity   Assist Walk 50 feet with 2 turns activity did not occur: Safety/medical concerns         Walk 150 feet activity  Assist Walk 150 feet activity did not occur: Safety/medical concerns         Walk 10 feet on uneven surface  activity   Assist Walk 10 feet on uneven surfaces activity did not occur: Safety/medical concerns         Wheelchair     Assist Will patient use wheelchair at discharge?: Yes Type of Wheelchair: Manual           Wheelchair 50 feet with 2 turns activity    Assist    Wheelchair 50 feet with 2 turns activity did not occur: Safety/medical concerns (requires TIS for postural support)       Wheelchair 150 feet activity      Assist  Wheelchair 150 feet activity did not occur: Safety/medical concerns       Blood pressure 119/71, pulse 96, temperature 97.9 F (36.6 C), resp. rate 18, height 5\' 6"  (1.676 m), weight 89.5 kg, SpO2 95 %.  Medical Problem List and Plan: 1.  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  Continue CIR, PT, OT, SLP     -making gains in speech, mobility 2.  Antithrombotics: -DVT/anticoagulation:  Pharmaceutical: Heparin--hold                          Dopplers negative for DVT.              -antiplatelet therapy: DAPT--resumed plavix, asa     -hgb holding  3. Pain Management: Tylenol prn.  Appears to be well controlled.  4. Mood: LCSW to follow for evaluation and support when appropriate.              -antipsychotic agents: N/A 5. Neuropsych: This patient is not capable of making decisions on her own behalf.  Continue trial of ritalin  for concentration and arousal    -continue with 5mg  dose for now   -arousal and mentation gradually improving 6. Skin/Wound Care: routine pressure relief measures.  7. Fluids/Electrolytes/Nutrition: NPO       -9/20 continue TF but cut to half rate to stimulate appetite  -h2o flushes continue  -off IVF 8. ABLA/UGIB:     EGD 9/15 apparently demonstrated non-bleeding esophagitis/gastritis, HH   -husband doesn't want to pursue further testing  9/17: resumed plavix/asa. Will not resume sq heparin    -protonix per tube now (or IV if needed today)   -GI consult appreciated!   -hgb holding at 11.7 again 9/20 after 2u PRBC   -recheck cbc Thursday 9. 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   -will reduce TF by half to help encourage intake 11. Resting tachycardia:              has  been ranging from 90's to 100's 12. Vasospasm prevention: To continue Nimotop for 3 additional days.  13. Bowel incontinence/loose stool: multifactorial, on abx for uti also  -  fiber/probiotic  -9/13 added cholestyramine TID  -has had persistent liquid stool which I assume is d/t TF--none for 24+ hours however 14. Proteus UTI: Ceftriaxone course completed  -f/u ua negative  -I/O cath schedule. Try to void prior to cath 18.  Prediabetes, confounded by tube feeds  Relatively controlled    Continue to monitor 19.  Leukoctyosis: resolved  -UA negative , procalc itonin wnl  - cxr clear  -  likely reactive related to GIB      LOS: 13 days A FACE TO FACE EVALUATION WAS PERFORMED  Meredith Staggers 02/02/2020, 11:16 AM

## 2020-02-02 NOTE — Progress Notes (Signed)
Patient asked writer to remove her right hand \\brace  after having it on for  approximately 2 hours refuse brace to be reapplied

## 2020-02-02 NOTE — Progress Notes (Signed)
Physical Therapy Session Note  Patient Details  Name: Erika Stanton MRN: [id number] Date of Birth: 04/20/1956  Today's Date: 02/02/2020 PT Individual Time: 1355-1450 PT Individual Time Calculation (min): 55 min   and  Today's Date: 02/02/2020 PT Missed Time: 20 Minutes Missed Time Reason: Patient fatigue  Short Term Goals: Week 2:  PT Short Term Goal 1 (Week 2): Pt will complete bed<>w/c with max assist +1 consistently. PT Short Term Goal 2 (Week 2): Pt will demonstrate static sitting balance x 5 minutes with min assist. PT Short Term Goal 3 (Week 2): Pt will consistently complete bed mobility with max assist +1.  Skilled Therapeutic Interventions/Progress Updates:    Pt received supine in bed and agreeable to therapy session. Supine>stitting R EOB, HOB partially elevated, with CGA for safety. L stand pivot to w/c, no AD, with min assist for balance.  Transported to/from gym in w/c for time management and energy conservation. Gait training 54ft and 2ft, no AD, with min/mod assist for balance and +2 assist w/c follow - demos slow gait speed with decreased B LE step length and foot clearance with slight shuffle pattern, downward gaze and increased postural sway/instability. Short distance ~64ft ambulatory transfer w/c>EOM with min assist for balance. Sit<>stands with CGA/min assist for steadying/lifting during session with pt demoing impaired B LE strength and endurance with increasing difficulty coming to stand towards end of session. Standing tolerance, visual scanning, attention, dual-cognitive- task via card matching with L UE - min assist for balance - pt able to place 2-3 cards prior to requiring seated rest break - mod manual facilitation for lifting L UE and positioning cards for improved grasp - pt demos impaired L hand sensation and possible impaired attention to L hand throughout session via unknowingly dropping items from that hand. Standing balance and L UE NMR via placing horseshoes on  basketball goal with min assist for balance and min/mod manual facilitation for increased L shoulder flexion to reach height of basketball goal. Pt adamantly requesting to return back to room and rest requiring significant encouragement to participate in final gait training trial ~32ft, no AD, with mod assist for balance due to increased L trunk lean and continuing the above gait impairments (no w/c follow). Transported back to room in w/c and pt's husband, Erika Stanton, present. He reports some of his main concerns are pt's ability to ambulate, ascend/descend 4 steps using L HR to get in/out of home, and swallowing - will notify primary therapists. Pt performed ~76ft short distance ambulation transfer w/c>EOB, no AD, with min assist for balance. Sit>supine with CGA. Therapist educated pt's husband on her CLOF and significant progress in the past few days and he was very encouraged. Pt left in bed with needs in reach, bed alarm on, and her husband present. Missed 20 minutes of skilled physical therapy.  Therapy Documentation Precautions:  Precautions Precautions: Fall Precaution Comments: NPO Restrictions Weight Bearing Restrictions: No  Pain:   No reports of pain throughout session.   Therapy/Group: Individual Therapy  Tawana Scale , PT, DPT, CSRS  02/02/2020, 1:03 PM

## 2020-02-02 NOTE — Progress Notes (Signed)
Speech Language Pathology Daily Session Note  Patient Details  Name: Erika Stanton MRN: [id number] Date of Birth: [birth year]  Today's Date: 02/02/2020 SLP Individual Time: 0800-0900 SLP Individual Time Calculation (min): 60 min  Short Term Goals: Week 2: SLP Short Term Goal 1 (Week 2): Patient will increase speech intelligibility to ~75% at the phrase level with Min A multimodal cues for use of speech intelligibility strategies. SLP Short Term Goal 2 (Week 2): Patient will attend to a functional task for ~10 minutes with Max A multimodal cues. SLP Short Term Goal 3 (Week 2): Patient will consume current diet with minimal overt s/s of aspiration and overall Mod A verbal cues for use of swallowing compensatory strategies. SLP Short Term Goal 4 (Week 2): Patient will conusme trials of thin liquids via tsp without overt s/s of aspiraiton over 3 sessions with Mod verbla cues to assess readiness for repeat MBS.  Skilled Therapeutic Interventions: Skilled treatment session focused on cognitive and dysphagia goals. SLP facilitated session by providing skilled observation with breakfast meal of Dys. 1 textures with nectar-thick liquids via tsp. Patient consumed meal with one overt cough X 1 with liquids and that required extra time to clear. Patient also reported increased difficulty with pureed textures due to it being "too thick." Recommend patient continue with current diet with modifications at times such as adding thickened liquids to the pureed textures to reduce thickness. Patient with increased attention and ability to engage in a functional conversation with overall Mod I. Also demonstrated increased awareness of swallowing function with increased ability to follow commands. Patient left upright in bed with alarm on and all needs within reach. Continue with current plan of care.      Pain No/Denies Pain   Therapy/Group: Individual Therapy  Rickayla Wieland 02/02/2020, 10:17 AM

## 2020-02-02 NOTE — Patient Care Conference (Signed)
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 Alger Simons Care Coodinator Present: Loralee Pacas, LCSWA;[name] Creig Hines, RN, BSN, CRRN Nurse Present: Isla Pence, RN PT Present: Tereasa Coop, PT OT Present: Meriel Pica, OT SLP Present: Weston Anna, SLP PPS Coordinator present : Ileana Ladd, Burna Mortimer, SLP     Current Status/Progress Goal Weekly Team Focus  Bowel/Bladder   Inability to urinate; incontinent of bowel, last BM 9/20  Become continent x2, PVR <36mL  Assess every shift and as needed; bladder scans q6hr PVRs   Swallow/Nutrition/ Hydration   Dys. 1 textures with nectar-thick liquids via tsp, Mod A  Min A  increse PO intake, trials of thin to assess readiness for repeat MBS   ADL's   min/mod A SPT, improved head/trunk control and midline orientation, max A adl, improving left AROM t/o and proximal right AROM  min A  bilateral UE NMRE, functional transfers, balance, family education   Mobility             Communication   Min-Mod A  Min A  use of speech intelligibilty strategies at the phrase level   Safety/Cognition/ Behavioral Observations  Mod-Max A  Min A  sustained attention, awareness and basic problem solving   Pain   No pain reported during shift  Pain <3/10  Assess every  shift and as needed   Skin   Bruises to bilateral arms and abdomen, otherwise intact  Prevent breakdown, frequent repositioning ongoing  Assess every shift and as needed     Discharge Planning:  D/c to home with husband who will provide 24/7 care pending pt level of care needs at d/c.   Team Discussion: Not voiding, schedule toileting 6/04/19/11, complains of pain when swallowing food. OT mod assist pivot transfers, sitting balance, max assist bathing and dressing. PT min/mod assist SP transfer, gait not pretty but improvement over last week. SLP solids are too thick, going to start RMT, slowly making improvement. Patient on target to meet rehab goals: yes, with slow progression.  *See Care Plan and progress notes for long and short-term goals.   Revisions to Treatment Plan:  None at this time.  Teaching Needs: Continue family education.  Current Barriers to Discharge: Incontinence, Medication compliance and Nutritional means  Possible Resolutions to Barriers: Continue with timed toileting, continue with current medication regimen, continue to advance diet, and supplement nutrition.     Medical Summary Current Status: slow neurological progress d/t diffuse nature of injury. gastritis with GIB--protonix/plavix and asa held---no further bleeding. still receiving TF (cut in half to help stimulate appetite)     Possible Resolutions to [employer] Focus: advance diet, liberate from TF. regular labs specifically monitoring of hgb, daily med mgt   Continued Need for Acute Rehabilitation Level of Care: The patient  requires daily medical management by a physician with specialized training in physical medicine and rehabilitation for the following reasons: Direction of a multidisciplinary physical rehabilitation program to maximize functional independence : Yes Medical management of patient stability for increased activity during participation in an intensive rehabilitation regime.: Yes  Analysis of laboratory values and/or radiology reports with any subsequent need for medication adjustment and/or medical intervention. : Yes   I attest that I was present, lead the team conference, and concur with the assessment and plan of the team.   Cristi Loron 02/02/2020, 3:01 PM

## 2020-02-03 ENCOUNTER — Inpatient Hospital Stay (HOSPITAL_COMMUNITY): Payer: BC Managed Care – PPO | Admitting: Occupational Therapy

## 2020-02-03 ENCOUNTER — Inpatient Hospital Stay (HOSPITAL_COMMUNITY): Payer: BC Managed Care – PPO | Admitting: Speech Pathology

## 2020-02-03 ENCOUNTER — Inpatient Hospital Stay (HOSPITAL_COMMUNITY): Payer: BC Managed Care – PPO | Admitting: Physical Therapy

## 2020-02-03 DIAGNOSIS — I639 Cerebral infarction, unspecified: Secondary | ICD-10-CM

## 2020-02-03 LAB — GLUCOSE, CAPILLARY
Glucose-Capillary: 110 mg/dL — ABNORMAL HIGH (ref 70–99)
Glucose-Capillary: 116 mg/dL — ABNORMAL HIGH (ref 70–99)
Glucose-Capillary: 122 mg/dL — ABNORMAL HIGH (ref 70–99)
Glucose-Capillary: 125 mg/dL — ABNORMAL HIGH (ref 70–99)
Glucose-Capillary: 131 mg/dL — ABNORMAL HIGH (ref 70–99)
Glucose-Capillary: 134 mg/dL — ABNORMAL HIGH (ref 70–99)

## 2020-02-03 MED ORDER — OSMOLITE 1.5 CAL PO LIQD
1000.0000 mL | ORAL | Status: DC
  Administered 2020-02-03 (×2): 1000 mL
  Filled 2020-02-03: qty 1000

## 2020-02-03 NOTE — Progress Notes (Signed)
Nutrition Follow-up  RD working remotely.  DOCUMENTATION CODES:   Not applicable  INTERVENTION:   Given pt is NPO, transition back to continuous tube feeds via Cortrak: - Osmolite 1.5 @ 65 ml/hr (can be held for up to 4 hours for therapies) - ProSource TF TID - Free water per MD/PA, currently 200 ml q 6 hours  Tube feeding regimenwith current free water flushes provides2070kcal, 115grams of protein, and 1763ml of H2O (meets 100% of needs).  NUTRITION DIAGNOSIS:   Inadequate oral intake related to dysphagia, lethargy/confusion as evidenced by meal completion < 50%.  Ongoing, pt now NPO  GOAL:   Patient will meet greater than or equal to 90% of their needs  Progressing  MONITOR:   PO intake, Supplement acceptance, Diet advancement, Labs, Weight trends, TF tolerance, I & O's  REASON FOR ASSESSMENT:   Consult Enteral/tube feeding initiation and management  ASSESSMENT:   64 year old female who was admitted on 01/05/20 with right-sided headache and left hemiparesis. 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 Pt remains NPO due to signs of aspiration with tube feeds via Cortrak. Admitted to CIR on 9/08.  9/15 - s/p EGD with findings of non-bleeding reflux esophagitis, Cortrak removed 9/17 - MBS with recommendations for dysphagia 1 diet with nectar-thick liquids, Cortrak replaced, tip gastric per Cortrak team  Pt made NPO this AM after session with SLP. Plan is for repeat MBS tomorrow with Cortrak in place. Per discussion with SLP, last MBS was completed without Cortrak tube present.  RD will transition pt back to  continuous tube feeds at this time to meet 100% of kcal and protein needs. Discussed plan with RN.  Current TF: Osmolite 1.5 @ 45 ml/hr for 12 hours, ProSource TF 45 ml TID, free water 200 ml q 6 hours  Meal Completion: 0-5% (when on dysphagia 1 diet with nectar thick liquids)  Medications reviewed and include: Questran TID per tube, SSI q 4 hours, ritalin, protonix, fibercon, KCl 40 mEq daily, florastor  Labs reviewed. CBG's: 87-131 x 24 hours  UOP: 2225 ml x 24 hours  Diet Order:   Diet Order            Diet NPO time specified  Diet effective now                 EDUCATION NEEDS:   No education needs have been identified at this time  Skin:  Skin Assessment: Reviewed RN Assessment  Last BM:  02/01/20 type 7  Height:   Ht Readings from Last 1 Encounters:  01/20/20 5\' 6"  (1.676 m)    Weight:   Wt Readings from Last 1 Encounters:  01/20/20 89.5 kg    Ideal Body Weight:  59.1 kg  BMI:  Body mass index is 31.85 kg/m.  Estimated Nutritional Needs:   Kcal:  1900-2100  Protein:  95-115 grams  Fluid:  >/= 1.9 L    Gaynell Face, MS, RD, LDN Inpatient Clinical Dietitian Please see [facility]iON for contact information.

## 2020-02-03 NOTE — Consult Note (Signed)
NEURO HOSPITALIST CONSULT NOTE   Requestig physician: Dr. Naaman Plummer  Reason for Consult: Long-term neurological prognosis  History obtained from:   Patient and Chart    HPI:                                                                                                                                          Erika Stanton is an 64 y.o. 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 Her presenting symptoms were right-sided headache and left hemiparesis. 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 Dr. Kathyrn Sheriff was consulted and the patient underwent stent supported embolization of the R-ICA aneurysm on 01/06/2020.  She had post-operative lethargy and was maintained on Nimodipine for the [facility] and Keppra for seizure prophylaxis. EEG revealed evidence of epileptogenicity arising from the right temporal region as well as severe diffuse encephalopathy but no seizures. She was on ASA/Plavix due to the stent.   She developed obtundation and follow up MRI brain revealed development of acute cortical and subcortical infarcts affecting the left anterior frontal and left parietal cortices as well as punctate microemboli in the right frontal and parietal regions; also noted was a slight increase in vasogenic edema without hydrocephalus. LTM EEG showed left cortical dysfunction felt to be due to underlying stroke - no seizures and Keppra was discontinued on 01/17/2020. Nimodipine was added due to concerns of cerebral vasospasm and tube feeds were started for nutritional support.   As her mentation improved, the patient was noted to have a flaccid RUE with decreased verbal output. At the time of discharge to  Inpatient Rehab on 9/8 she remained NPO due to signs of aspiration. She had cognitive deficits with disorientation, unintelligible speech and bouts of lethargy. Therapy was ongoing and patient required assistance to advance LLE for pregait activity.   During her stay on the Rehab unit, she passed swallow evaluation and began to take PO.  She has been afebrile with stable respirations, BP and HR.   Today's neurological exam by PM&R attending revealed her to be pleasant and cooperative, with improved speech clarity and volume. Strength was trace to 1/5 in her proximal RUE, otherwise 0/5 RUE, RLE 0/5, LUE 3/5 and LLE 4/5. On musculoskeletal exam, there was no pain with AROM/PROM of the BUE and BLE; no joint swelling was noted. Of note, she has been deemed to be not capable of making decisions on her own behalf; she is on a trial of ritalin for concentration and arousal.  She has been incontinent of bowel in the context of loose stools. She continues on DAPT.  Family requested that Neurology be re-consulted for additional prognostic information.   History reviewed. No pertinent past medical history.  Past Surgical History:  Procedure Laterality Date  . CHOLECYSTECTOMY    . COLONOSCOPY  11/2017   at [facility]. no recurrent polyps.  suggest repeat surveillance study 11/2022.    Marland Kitchen COLONOSCOPY W/ POLYPECTOMY  06/2014   Dr Roxy Manns at [facility].  3 adenomatoous polyps, anal fissure.    . ESOPHAGOGASTRODUODENOSCOPY (EGD) WITH PROPOFOL N/A 01/27/2020   Procedure: ESOPHAGOGASTRODUODENOSCOPY (EGD) WITH PROPOFOL;  Surgeon: Mauri Pole, MD;  Location: [facility];  Service: Endoscopy;  Laterality: N/A;  . IR 3D INDEPENDENT WKST  01/06/2020  . IR ANGIO INTRA EXTRACRAN SEL INTERNAL CAROTID BILAT MOD SED  01/06/2020  . IR ANGIO VERTEBRAL SEL VERTEBRAL UNI L MOD SED  01/06/2020  . IR ANGIOGRAM FOLLOW UP STUDY  01/06/2020  . IR ANGIOGRAM FOLLOW UP STUDY  01/06/2020  . IR ANGIOGRAM FOLLOW UP STUDY  01/06/2020  . IR  ANGIOGRAM FOLLOW UP STUDY  01/06/2020  . IR ANGIOGRAM FOLLOW UP STUDY  01/06/2020  . IR ANGIOGRAM FOLLOW UP STUDY  01/06/2020  . IR ANGIOGRAM FOLLOW UP STUDY  01/06/2020  . IR ANGIOGRAM FOLLOW UP STUDY  01/06/2020  . IR ANGIOGRAM FOLLOW UP STUDY  01/06/2020  . IR ANGIOGRAM FOLLOW UP STUDY  01/06/2020  . IR NEURO EACH ADD'L AFTER BASIC UNI RIGHT (MS)  01/06/2020  . IR TRANSCATH/EMBOLIZ  01/06/2020  . RADIOLOGY WITH ANESTHESIA N/A 01/06/2020   Procedure: IR WITH ANESTHESIA FOR ANEURYSM;  Surgeon: Consuella Lose, MD;  Location: [facility];  Service: Radiology;  Laterality: N/A;  . TUBAL LIGATION      Family History  Problem Relation Age of Onset  . Cancer Mother   . Congestive Heart Failure Mother   . Lung cancer Father   . Lung cancer Sister               Social History:  reports that she has quit smoking. She has never used smokeless tobacco. No history on file for alcohol use and drug use.  Allergies  Allergen Reactions  . Sulfa Antibiotics Rash    MEDICATIONS:                                                                                                                     Scheduled: . amoxicillin  250 mg Per Tube Q8H  . aspirin  325 mg Per Tube Daily  . chlorhexidine  15 mL Mouth Rinse BID  . cholestyramine  4 g Per Tube TID  . clopidogrel  75 mg Per Tube Daily  . feeding supplement (PROSource TF)  45 mL Per Tube TID  . free water  200 mL Per Tube Q6H  . insulin aspart  0-15 Units Subcutaneous Q4H  . mouth rinse  15 mL Mouth Rinse q12n4p  . methylphenidate  5 mg Per NG tube  BID WC  . pantoprazole sodium  40 mg Per Tube BID  . polycarbophil  625 mg Per NG tube Daily  . potassium chloride  40 mEq Per Tube Daily  . saccharomyces boulardii  250 mg Per NG tube BID  . sodium chloride flush  10-40 mL Intracatheter Q12H  . tamsulosin  0.4 mg Oral QPC supper     ROS:                                                                                                                                        Some agitation. Denies any pain. Concerned about slow recovery of RUE and swallowing function. Other ROS as per HPI.    Blood pressure 115/81, pulse 84, temperature 97.8 F (36.6 C), resp. rate 17, height 5\' 6"  (1.676 m), weight 89.5 kg, SpO2 96 %.   General Examination:                                                                                                       Physical Exam  HEENT-  Normocephalic. No scleral injection. Cortrak in place.  Lungs- Respirations unlabored Extremities- Warm and well perfused  Neurological Examination Mental Status: Awake and alert. Oriented to city, state, month, year and day of the week. Speech fluent with intact comprehension. Somewhat flattened affect with decreased prosodic content of speech. Able to correctly recite the months of the year forwards and backwards.  Cranial Nerves: II: Visual fields grossly normal with no extinction to DSS. PERRL.   III,IV, VI: No ptosis. EOMI with saccadic quality of pursuits noted.   V,VII: Decreased NL fold on the left. Facial temp sensation equal bilaterally VIII: hearing intact to voice IX,X: No hypophonia XI: Weak on the right.  XII: Midline tongue extension Motor: RUE 1-2/5 RLE 3/5 LUE 4/5 LLE 4/5 Sensory: Temp and light touch subjectively intact throughout, bilaterally Deep Tendon Reflexes: Brisk reflexes with some asymmetry noted.  Cerebellar: No ataxia with FNF on the left. Unable to assess on the right.  Gait: Deferred   Lab Results: Basic Metabolic Panel: Recent Labs  Lab 01/28/20 0330 01/29/20 0429 02/01/20 0438  NA 136 137 137  K 3.6 3.4* 4.3  CL 99 101 101  CO2 27 26 27   GLUCOSE 98 121* 108*  BUN 18 12 18   CREATININE 0.52 0.49 0.49  CALCIUM 9.2 9.0 8.9    CBC: Recent Labs  Lab 01/28/20 0330 01/29/20 0429 01/30/20 0351 01/31/20 0404 02/01/20 4431  WBC 9.4 6.8 7.4 6.7 6.8  HGB 11.1* 11.1* 11.6* 10.9* 11.7*  HCT 33.3* 33.8* 36.1 35.0* 36.6  MCV 93.3 95.2  98.4 98.9 98.1  PLT 401* 421* 398 371 394    Cardiac Enzymes: No results for input(s): CKTOTAL, CKMB, CKMBINDEX, TROPONINI in the last 168 hours.  Lipid Panel: No results for input(s): CHOL, TRIG, HDL, CHOLHDL, VLDL, LDLCALC in the last 168 hours.  Imaging: No results found.   Assessment: 64 y.o. 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 Her presenting symptoms were right-sided headache and left hemiparesis. 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 Dr. Kathyrn Sheriff was consulted and the patient underwent stent supported embolization of the R-ICA aneurysm on 01/06/2020. Follow up MRI brain revealed development of acute cortical and subcortical infarcts affecting the left anterior frontal and left parietal cortices as well as punctate microemboli in the right frontal and parietal regions; also noted was a slight increase in vasogenic edema without hydrocephalus. She has significantly improved while on the rehab service, but family and patient were hoping for a more rapid recovery. Neurology consulted for prognosis.  1. Exam reveals mild mood lability on an underlying flattened but somewhat dysthymic affect. She exhibits intact orientation and concentration for relatively simple cognitive tasks. She has decreased NL fold on the left. RUE 1-2/5, RLE 3/5, LUE 4/5 and LLE 4/5. 2. Significantly gradual improvement during her stay.    Recommendations: 1. Continue with intensive rehabilitation.  2. Patient seemed to do better with tasks when encouraged optimistically. She may benefit from psychology consult to discuss issues with possible sadness and decreased self image resulting from her new neurological  impairments 3. Patient was encouraged to keep up the great work with her therapy. It was clarified to her that although progress in some areas may be slower than others, continuing to make maximum effort with therapy will likely result in a better long-term outcome.    Electronically signed: Dr. Kerney Elbe 02/03/2020, 7:24 PM

## 2020-02-03 NOTE — Plan of Care (Signed)
  Problem: Consults Goal: RH BRAIN INJURY PATIENT EDUCATION Description: Description: See Patient Education module for eduction specifics Outcome: Progressing Goal: Skin Care Protocol Initiated - if Braden Score 18 or less Description: If consults are not indicated, leave blank or document N/A Outcome: Progressing Goal: Nutrition Consult-if indicated Outcome: Progressing Goal: Diabetes Guidelines if Diabetic/Glucose > 140 Description: If diabetic or lab glucose is > 140 mg/dl - Initiate Diabetes/Hyperglycemia Guidelines & Document Interventions  Outcome: Progressing   Problem: RH BOWEL ELIMINATION Goal: RH STG MANAGE BOWEL WITH ASSISTANCE Description: STG Manage Bowel with min Assistance. Outcome: Progressing Goal: RH STG MANAGE BOWEL W/MEDICATION W/ASSISTANCE Description: STG Manage Bowel with Medication with min Assistance. Outcome: Progressing   Problem: RH BLADDER ELIMINATION Goal: RH STG MANAGE BLADDER WITH MEDICATION WITH ASSISTANCE Description: STG Manage Bladder With Medication With min Assistance. Outcome: Progressing   Problem: RH SKIN INTEGRITY Goal: RH STG SKIN FREE OF INFECTION/BREAKDOWN Description: Skin to remain free from infection and breakdown with min assist while on CIR. Outcome: Progressing Goal: RH STG MAINTAIN SKIN INTEGRITY WITH ASSISTANCE Description: STG Maintain Skin Integrity With min Assistance. Outcome: Progressing Goal: RH STG ABLE TO PERFORM INCISION/WOUND CARE W/ASSISTANCE Description: STG Able To Perform Incision/Wound Care With min Assistance. Outcome: Progressing   Problem: RH SAFETY Goal: RH STG ADHERE TO SAFETY PRECAUTIONS W/ASSISTANCE/DEVICE Description: STG Adhere to Safety Precautions With min Assistance and appropriate assistive Device. Outcome: Progressing   Problem: RH COGNITION-NURSING Goal: RH STG USES MEMORY AIDS/STRATEGIES W/ASSIST TO PROBLEM SOLVE Description: STG Uses Memory Aids/Strategies With min Assistance to Problem  Solve. Outcome: Progressing Goal: RH STG ANTICIPATES NEEDS/CALLS FOR ASSIST W/ASSIST/CUES Description: STG Anticipates Needs/Calls for Assist With min Assistance/Cues. Outcome: Progressing   Problem: RH PAIN MANAGEMENT Goal: RH STG PAIN MANAGED AT OR BELOW PT'S PAIN GOAL Description: <3 on a 0-10 pain scale. Outcome: Progressing   Problem: RH KNOWLEDGE DEFICIT BRAIN INJURY Goal: RH STG INCREASE KNOWLEDGE OF SELF CARE AFTER BRAIN INJURY Description: Patient will demonstrate knowledge of medication management, diabetes management, dietary management, and follow up care with the MD post discharge with min assist from [facility] staff. Outcome: Progressing Goal: RH STG INCREASE KNOWLEDGE OF DYSPHAGIA/FLUID INTAKE Description: Patient will be able to demonstrate knowledge of dysphagia diets, thickened liquids, and swallowing precautions with min assist from rehab staff. Outcome: Progressing   Problem: RH Vision Goal: RH LTG Vision (Specify) Outcome: Progressing

## 2020-02-03 NOTE — Progress Notes (Addendum)
Physical Therapy Session Note  Patient Details  Name: Erika Stanton MRN: [id number] Date of Birth: 07-23-1955  Today's Date: 02/03/2020 PT Individual Time: 1301-1411 PT Individual Time Calculation (min): 70 min   Short Term Goals: Week 2:  PT Short Term Goal 1 (Week 2): Pt will complete bed<>w/c with max assist +1 consistently. PT Short Term Goal 2 (Week 2): Pt will demonstrate static sitting balance x 5 minutes with min assist. PT Short Term Goal 3 (Week 2): Pt will consistently complete bed mobility with max assist +1.  Skilled Therapeutic Interventions/Progress Updates: Pt presents semi-reclined in bed , w/ spouse present.  Pt agreeable to therapy.  Nursing removed pt from feeding tube for rx.  Pt performed sup to sit transfer w/ CGA and verbal cues to continue transfer to scoot to EOB.  Pt sat EOB and attempted to don shoes w/ figure-4 positioning, but still required assist from PT.  Pt transfers multiple times during session sit to stand w/ min A and verbal cues.  Pt requires min A for stand to sit 2/2 anxiety and attempting to sit before reaching back.  Pt amb multiple trials w/o AD and min to occasional mod A 2/2 anxiety about ambulating too far.  Pt amb up to 25' including turns to return to seat.  Pt w/ weakening L knee, although doesn't buckle.  Pt performed Nu-step at Level 2 for 5' w/ BLE and LUE using adapter to handlebar.  Pt performed standing w/o AD 30 secs and then 1' w/ max encouragement to continue standing.  Pt c/o pain L knee.  Pt performed standing ad hooking/unhooking horseshoes from basketball hoop w/ verbal cueing for increased L shoulder flexion.  Pt returned to room in w/c and amb from hallway to bed w/ min A.  Pt transfers sit to R sidelying w/ CGA and verbal cues.  RN in room to restart tube feeding and handed off.  Spouse present in room.  Pt will be changed from maxilift to assist of 1 person for transfers.      Therapy Documentation Precautions:   Precautions Precautions: Fall Precaution Comments: NPO Restrictions Weight Bearing Restrictions: No General:   Vital Signs: Therapy Vitals Temp: 97.8 F (36.6 C) Pulse Rate: 84 Resp: 17 BP: 115/81 Patient Position (if appropriate): Lying Oxygen Therapy SpO2: 96 % O2 Device: Room Air Pain: c/o pain, but not quantified Pain Assessment Faces Pain Scale: Hurts whole lot Pain Type: Neuropathic pain Pain Location: Arm Pain Orientation: Right Pain Descriptors / Indicators: Aching Pain Onset: With Activity Pain Intervention(s): Repositioned Mobility:      Therapy/Group: Individual Therapy  Ladoris Gene 02/03/2020, 2:12 PM

## 2020-02-03 NOTE — Progress Notes (Signed)
Occupational Therapy Session Note  Patient Details  Name: Erika Stanton MRN: [id number] Date of Birth: 23-Nov-1955  Today's Date: 02/03/2020 OT Individual Time: 1000-1105 OT Individual Time Calculation (min): 65 min    Short Term Goals: Week 2:  OT Short Term Goal 1 (Week 2): Pt will use L hand with mod A to wash 50 % of her UB. OT Short Term Goal 2 (Week 2): Pt will be able to sit upright on  a BSC with min A. OT Short Term Goal 3 (Week 2): Pt will be able to squat pivot with mod A of 2 to a BSC. OT Short Term Goal 4 (Week 2): Pt will be able to sit to stand with mod A of 2 to increase skills needed for LB dressing.  Skilled Therapeutic Interventions/Progress Updates:    Pt received in bed agreeable to a shower today. To prepare for shower, needed to cover her central line in R arm with plastic wrap and bag.  With just the slightest passive movement to her R arm, pt moaning and stated her R arm "hurt so much".  She said it has always been painful since the stroke and does not feel it is worse.  Her pain in that arm is quite limiting along with only trace R shoulder and 25% or less of LUE AROM.  Her arm limitations cause her to be very limited with her self care BUT her active leg strength has allowed her to be much more independent with her mobility.  Pt completed all sit to stands, stand pivot transfers (from bed to chair, chair >< tub bench in shower) with min A.  She was able to stand with L hand on bar or sink and then release hand to partially wash her bottom and partially pull up her pants.   See ADL notes below.  Pt also has not been tolerating hand splint for more than 2 hours but her wrist and finger ROM is WNL.    Once self care completed pt in wc with RUE supported by pillows. She does not like the lap tray. Belt alarm on and all needs met.   Therapy Documentation Precautions:  Precautions Precautions: Fall Precaution Comments: NPO Restrictions Weight Bearing Restrictions:  No   Pain: Pain Assessment Pain Scale: 0-10 Pain Score: 0-No pain Faces Pain Scale: Hurts whole lot Pain Type: Neuropathic pain Pain Location: Arm Pain Orientation: Right Pain Descriptors / Indicators: Aching Pain Onset: With Activity Pain Intervention(s): Repositioned ADL: ADL Eating: NPO Grooming: Dependent Where Assessed-Grooming: Bed level Upper Body Bathing: Maximal assistance Where Assessed-Upper Body Bathing: Shower Lower Body Bathing: Maximal assistance Where Assessed-Lower Body Bathing: Shower Upper Body Dressing: Maximal assistance Where Assessed-Upper Body Dressing: Wheelchair Lower Body Dressing: Maximal assistance Where Assessed-Lower Body Dressing: Wheelchair, Standing at sink, Sitting at sink Toileting: Dependent Where Assessed-Toileting: Bed level Social research officer, government: Minimal assistance Social research officer, government Method: Stand pivot ADL Comments: patient able to hold wash cloth with left hand but unable to manipulate for washing, she is able to lift each leg for donning pants/socks   Therapy/Group: Individual Therapy  [place] 02/03/2020, 12:22 PM

## 2020-02-03 NOTE — Progress Notes (Signed)
[place] PHYSICAL MEDICINE & REHABILITATION PROGRESS NOTE   Subjective/Complaints: No complaints Denies pain, constipation, insomnia  ROS: Patient denies fever, rash, sore throat, blurred vision, nausea, vomiting, diarrhea, cough, shortness of breath or chest pain, joint or back pain, headache, or mood change.    Objective:   No results found. Recent Labs    02/01/20 0438  WBC 6.8  HGB 11.7*  HCT 36.6  PLT 394   Recent Labs    02/01/20 0438  NA 137  K 4.3  CL 101  CO2 27  GLUCOSE 108*  BUN 18  CREATININE 0.49  CALCIUM 8.9    Intake/Output Summary (Last 24 hours) at 02/03/2020 1413 Last data filed at 02/03/2020 1221 Gross per 24 hour  Intake 660 ml  Output 2125 ml  Net -1465 ml     Physical Exam: Vital Signs Blood pressure 115/81, pulse 84, temperature 97.8 F (36.6 C), resp. rate 17, height 5\' 6"  (1.676 m), weight 89.5 kg, SpO2 96 %.  General: Alert, No apparent distress HEENT: Head is normocephalic, atraumatic, PERRLA, EOMI, sclera anicteric, oral mucosa pink and moist, dentition intact, ext ear canals clear,  Neck: Supple without JVD or lymphadenopathy Heart: Reg rate and rhythm. No murmurs rubs or gallops Chest: CTA bilaterally without wheezes, rales, or rhonchi; no distress Abdomen: Soft, non-tender, non-distended, bowel sounds positive. Extremities: No clubbing, cyanosis, or edema. Pulses are 2+ Psych: pleasant and cooperative Neurologic: improved speech clarity and volume. Trace to 1/5 prox right UE, otherwise 0/5 RUE, RLE 0/5, LUE 3/5 , LLE 4/5 Musculoskeletal: No pain with AROM/PROM BUE and BLE . No joint swelling  Skin: numerous bruises along limbs and abdomen which appear less prominent    Assessment/Plan: 1. 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  Physiatrist is providing close team supervision and 24 hour  management of active medical problems listed below.  Physiatrist and rehab team continue to assess barriers to discharge/monitor patient progress toward functional and medical goals  Care Tool:  Bathing    Body parts bathed by patient: Front perineal area, Abdomen, Left upper leg, Right upper leg   Body parts bathed by helper: Buttocks, Right lower leg, Left lower leg, Face, Right arm, Left arm     Bathing assist Assist Level: Maximal Assistance - Patient 24 - 49%     Upper Body Dressing/Undressing Upper body dressing   What is the patient wearing?: Pull over shirt    Upper body assist Assist Level: Maximal Assistance - Patient 25 - 49%    Lower Body Dressing/Undressing Lower body dressing      What is the patient wearing?: Pants, Incontinence brief     Lower body assist Assist for lower body dressing: Maximal Assistance - Patient 25 - 49%     Toileting Toileting    Toileting assist Assist for toileting: Dependent - Patient 0%     Transfers Chair/bed transfer  Transfers assist     Chair/bed transfer assist level: Minimal Assistance - Patient > 75%     Locomotion Ambulation   Ambulation assist      Assist level: Minimal Assistance - Patient > 75% Assistive device: No Device Max distance: 25   Walk 10 feet activity   Assist     Assist level: Minimal Assistance - Patient > 75% Assistive device: No Device   Walk 50 feet activity   Assist Walk 50 feet with 2 turns  activity did not occur: Safety/medical concerns         Walk 150 feet activity   Assist Walk 150 feet activity did not occur: Safety/medical concerns         Walk 10 feet on uneven surface  activity   Assist Walk 10 feet on uneven surfaces activity did not occur: Safety/medical concerns         Wheelchair     Assist Will patient use wheelchair at discharge?: Yes Type of Wheelchair: Manual           Wheelchair 50 feet with 2 turns  activity    Assist    Wheelchair 50 feet with 2 turns activity did not occur: Safety/medical concerns (requires TIS for postural support)       Wheelchair 150 feet activity     Assist  Wheelchair 150 feet activity did not occur: Safety/medical concerns       Blood pressure 115/81, pulse 84, temperature 97.8 F (36.6 C), resp. rate 17, height 5\' 6"  (1.676 m), weight 89.5 kg, SpO2 96 %.  Medical Problem List and Plan: 1.  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  Continue CIR, PT, OT, SLP     -making gains in speech, mobility 2.  Antithrombotics: -DVT/anticoagulation:  Pharmaceutical: Heparin--hold                          Dopplers negative for DVT.              -antiplatelet therapy: DAPT--resumed plavix, asa     -hgb holding  3. Pain Management: Tylenol prn.  Well controlled 4. Mood: LCSW to follow for evaluation and support when appropriate.              -antipsychotic agents: N/A 5. Neuropsych: This patient is not capable of making decisions on her own behalf.  Continue trial of ritalin  for concentration and arousal    -continue with 5mg  dose for now   -arousal and mentation gradually improving 6. Skin/Wound Care: routine pressure relief measures.  7. Fluids/Electrolytes/Nutrition: NPO       -9/20 continue TF but cut to half rate to stimulate appetite  -h2o flushes continue  -off IVF 8. ABLA/UGIB:     EGD 9/15 apparently demonstrated non-bleeding esophagitis/gastritis, HH   -husband doesn't want to pursue further testing  9/17: resumed plavix/asa. Will not resume sq heparin    -protonix per tube now (or IV if needed today)   -GI consult appreciated!   -hgb holding at 11.7 again 9/20 after 2u PRBC   -recheck cbc Thursday 9. 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   -will reduce TF by half to help encourage intake 11. Resting tachycardia:              has been well controlled.  12. Vasospasm prevention: To continue Nimotop for 3 additional days.  13. Bowel incontinence/loose stool: multifactorial, on abx for uti also  -  fiber/probiotic  -9/13 added cholestyramine TID  -has had persistent liquid stool which I assume is d/t TF--none for 24+ hours however 14. Proteus UTI: Ceftriaxone course completed  -f/u ua negative  -I/O cath schedule. Try to void prior to cath 18.  Prediabetes, confounded by tube feeds  Relatively controlled.   Continue to monitor 19.  Leukoctyosis: resolved  -UA negative , procalc itonin wnl  - cxr clear  -likely reactive related to GIB      LOS: 14 days A FACE TO FACE EVALUATION WAS PERFORMED  Martha Clan P Theran Vandergrift 02/03/2020, 2:13 PM

## 2020-02-03 NOTE — Progress Notes (Deleted)
Pt leaving unit per wheelchair with all belongings. Accompanied by husband. Pt left stable.

## 2020-02-03 NOTE — Progress Notes (Signed)
Speech Language Pathology Daily Session Note  Patient Details  Name: Erika Stanton MRN: [id number] Date of Birth: 1956-04-16  Today's Date: 02/03/2020 SLP Individual Time: 0725-0825 SLP Individual Time Calculation (min): 60 min  Short Term Goals: Week 2: SLP Short Term Goal 1 (Week 2): Patient will increase speech intelligibility to ~75% at the phrase level with Min A multimodal cues for use of speech intelligibility strategies. SLP Short Term Goal 2 (Week 2): Patient will attend to a functional task for ~10 minutes with Max A multimodal cues. SLP Short Term Goal 3 (Week 2): Patient will consume current diet with minimal overt s/s of aspiration and overall Mod A verbal cues for use of swallowing compensatory strategies. SLP Short Term Goal 4 (Week 2): Patient will conusme trials of thin liquids via tsp without overt s/s of aspiraiton over 3 sessions with Mod verbla cues to assess readiness for repeat MBS.  Skilled Therapeutic Interventions: Skilled treatment session focused on dysphagia and cognitive goals. SLP facilitated session by providing skilled observation with breakfast meal of Dys. 1 textures with nectar-thick liquids. Patient with minimal PO intake and demonstrated consistent overt s/s of aspiration, suspect due to increased residuals from placement of NG tube. Recommend NPO with repeat MBS tomorrow to assess swallow function with tube present. SLP also introduced EMST. Patient performed 15 repetitions with Mod verbal cues for accuracy at 10 cm H2O with a self-perceived effort level of 8/10. Patient perseverative on getting dressed and insistent that she can perform task independently, when SLP asked her to take of her shirt by herself, she reported, "I don't want to right now." SLP provided education regarding current physical and cognitive deficits. Patient verbalized understanding but will need reinforcement. Patient left upright in bed with alarm on and all needs within reach. Continue  with current plan of care.      Pain Pain Assessment Pain Scale: 0-10 Pain Score: 0-No pain  Therapy/Group: Individual Therapy  Lynnel Zanetti 02/03/2020, 9:10 AM

## 2020-02-04 ENCOUNTER — Inpatient Hospital Stay (HOSPITAL_COMMUNITY): Payer: BC Managed Care – PPO | Admitting: Occupational Therapy

## 2020-02-04 ENCOUNTER — Encounter (HOSPITAL_COMMUNITY): Payer: BC Managed Care – PPO | Admitting: Speech Pathology

## 2020-02-04 ENCOUNTER — Inpatient Hospital Stay (HOSPITAL_COMMUNITY): Payer: BC Managed Care – PPO | Admitting: Physical Therapy

## 2020-02-04 ENCOUNTER — Inpatient Hospital Stay (HOSPITAL_COMMUNITY): Payer: BC Managed Care – PPO

## 2020-02-04 LAB — GLUCOSE, CAPILLARY
Glucose-Capillary: 101 mg/dL — ABNORMAL HIGH (ref 70–99)
Glucose-Capillary: 113 mg/dL — ABNORMAL HIGH (ref 70–99)
Glucose-Capillary: 121 mg/dL — ABNORMAL HIGH (ref 70–99)
Glucose-Capillary: 127 mg/dL — ABNORMAL HIGH (ref 70–99)
Glucose-Capillary: 95 mg/dL (ref 70–99)
Glucose-Capillary: 98 mg/dL (ref 70–99)

## 2020-02-04 MED ORDER — AMOXICILLIN 250 MG/5ML PO SUSR
250.0000 mg | Freq: Three times a day (TID) | ORAL | Status: DC
  Administered 2020-02-04 – 2020-02-05 (×2): 250 mg
  Filled 2020-02-04 (×3): qty 5

## 2020-02-04 MED ORDER — AMOXICILLIN 250 MG PO CAPS
250.0000 mg | ORAL_CAPSULE | Freq: Three times a day (TID) | ORAL | Status: DC
  Administered 2020-02-04: 250 mg via ORAL
  Filled 2020-02-04: qty 1

## 2020-02-04 MED ORDER — GABAPENTIN 100 MG PO CAPS
100.0000 mg | ORAL_CAPSULE | Freq: Three times a day (TID) | ORAL | Status: DC
  Administered 2020-02-04 – 2020-02-23 (×54): 100 mg via ORAL
  Filled 2020-02-04 (×55): qty 1

## 2020-02-04 NOTE — Plan of Care (Signed)
  Problem: RH Balance Goal: LTG Patient will maintain dynamic standing balance (PT) Description: LTG:  Patient will maintain dynamic standing balance with assistance during mobility activities (PT) Flowsheets (Taken 02/04/2020 1810) LTG: Pt will maintain dynamic standing balance during mobility activities with:: (upgraded based on pt progress) Minimal Assistance - Patient > 75% Note: upgraded based on pt progress   Problem: Sit to Stand Goal: LTG:  Patient will perform sit to stand with assistance level (PT) Description: LTG:  Patient will perform sit to stand with assistance level (PT) Flowsheets (Taken 02/04/2020 1810) LTG: PT will perform sit to stand in preparation for functional mobility with assistance level: (upgraded based on pt progress) Supervision/Verbal cueing Note: upgraded based on pt progress   Problem: RH Bed Mobility Goal: LTG Patient will perform bed mobility with assist (PT) Description: LTG: Patient will perform bed mobility with assistance, with/without cues (PT). Flowsheets (Taken 02/04/2020 1810) LTG: Pt will perform bed mobility with assistance level of: (upgraded based on pt progress) Supervision/Verbal cueing Note: upgraded based on pt progress   Problem: RH Bed to Chair Transfers Goal: LTG Patient will perform bed/chair transfers w/assist (PT) Description: LTG: Patient will perform bed to chair transfers with assistance (PT). Flowsheets (Taken 02/04/2020 1810) LTG: Pt will perform Bed to Chair Transfers with assistance level: (upgraded based on pt progress) Supervision/Verbal cueing Note: upgraded based on pt progress   Problem: RH Car Transfers Goal: LTG Patient will perform car transfers with assist (PT) Description: LTG: Patient will perform car transfers with assistance (PT). Flowsheets (Taken 02/04/2020 1810) LTG: Pt will perform car transfers with assist:: (upgraded based on pt progress) Contact Guard/Touching assist Note: upgraded based on pt progress    Problem: RH Ambulation Goal: LTG Patient will ambulate in controlled environment (PT) Description: LTG: Patient will ambulate in a controlled environment, # of feet with assistance (PT). Flowsheets (Taken 02/04/2020 1811) LTG: Pt will ambulate in controlled environ  assist needed:: (upgraded based on pt progress) Contact Guard/Touching assist LTG: Ambulation distance in controlled environment: 42ft using LRAD Note: upgraded based on pt progress Goal: LTG Patient will ambulate in home environment (PT) Description: LTG: Patient will ambulate in home environment, # of feet with assistance (PT). 02/04/2020 1811 by Tawana Scale, PT Flowsheets (Taken 02/04/2020 1811) LTG: Pt will ambulate in home environ  assist needed:: (upgraded based on pt progress) Contact Guard/Touching assist LTG: Ambulation distance in home environment: 47ft using LRAD Note: upgraded based on pt progress 02/04/2020 1811 by Tawana Scale, PT Reactivated   Problem: RH Stairs Goal: LTG Patient will ambulate up and down stairs w/assist (PT) Description: LTG: Patient will ambulate up and down # of stairs with assistance (PT) 02/04/2020 1811 by Tawana Scale, PT Flowsheets (Taken 02/04/2020 1811) LTG: Pt will ambulate up/down stairs assist needed:: (upgraded based on pt progress) Minimal Assistance - Patient > 75% LTG: Pt will  ambulate up and down number of stairs: 4 steps using HRs per home set-up Note: upgraded based on pt progress 02/04/2020 1811 by Tawana Scale, PT Reactivated

## 2020-02-04 NOTE — Progress Notes (Signed)
Modified Barium Swallow Progress Note  Patient Details  Name: Erika Stanton MRN: [id number] Date of Birth: June 15, 1955  Today's Date: 02/04/2020  Modified Barium Swallow completed.  Full report located under Chart Review in the Imaging Section.  Brief recommendations include the following:  Clinical Impression  Although patient reports difficulty with swallowing and displays consistent overt s/s of aspiration/penetration at the bedside, patient's overall swallowing function appears slightly improved since last MBS.  Patient was lethargic throughout study and required encouragement for full participation resulting in patient declining attempting to feed herself with her left hand or take "bigger" bites/sips.  Patient continues to demonstrate impaired epiglottic inversion, anterior laryngeal mobility and pharyngeal constriction resulting in mild vallecular residue with both solids and liquids that was reduced with additional swallows.  Patient with 1 episode of penetration to the cords with thin liquids that was sensed and cleared and mild high intermittent penetration that was reduced with a chin tuck.  Suspect patient's overall swallowing function will continue to improve with removal of NG tube. Recommend patient initiate a diet of Dys. 1 textures with thin liquids with goal of upgrading solids quickly as cognitive functioning continues to improve in order to maximize PO intake and aid in removal of NG tube.   Swallow Evaluation Recommendations       SLP Diet Recommendations: Dysphagia 1 (Puree) solids;Thin liquid   Liquid Administration via: Spoon;Straw   Medication Administration: Crushed with puree   Supervision: Full supervision/cueing for compensatory strategies;Staff to assist with self feeding   Compensations: Minimize environmental distractions;Slow rate;Small sips/bites;Monitor for anterior loss;Multiple dry swallows after each bite/sip   Postural Changes: Seated upright at 90  degrees   Oral Care Recommendations: Oral care BID   Other Recommendations: Order thickener from pharmacy;Prohibited food (jello, ice cream, thin soups);Remove water pitcher;Have oral suction available    Erika Stanton 02/04/2020,10:50 AM

## 2020-02-04 NOTE — Progress Notes (Signed)
This patient been awaken the majority of the shift, states she is having difficulty in sleeping tonight and the FT is irritating her nares, assessed nares area with no signs of redness, skin breakage or irritation noted. Patient has been repositioned and turned, with oral and back rubs provided, Continue to monitor closely, HOB remains elevated 30%,appears anxious regarding procedure MBS, reassurance continue

## 2020-02-04 NOTE — Progress Notes (Signed)
Occupational Therapy Session Note  Patient Details  Name: Erika Stanton MRN: [id number] Date of Birth: 11-Jan-1956  Today's Date: 02/04/2020 OT Individual Time: 5701-7793 OT Individual Time Calculation (min): 73 min    Short Term Goals: Week 2:  OT Short Term Goal 1 (Week 2): Pt will use L hand with mod A to wash 50 % of her UB. OT Short Term Goal 2 (Week 2): Pt will be able to sit upright on  a BSC with min A. OT Short Term Goal 3 (Week 2): Pt will be able to squat pivot with mod A of 2 to a BSC. OT Short Term Goal 4 (Week 2): Pt will be able to sit to stand with mod A of 2 to increase skills needed for LB dressing.  Skilled Therapeutic Interventions/Progress Updates:    Pt greeted at time of session supine in bed resting but agreeable to OT sesion despite fatigue. Pain with PROM/AAROM to RUE later in session no # given but rest breaks provided and repositioning PRN. Supine > sit EOB CGA, SPT to wheelchair Min A. Brought to therapy gym via [place] chair and SPT to therapy mat Min. Performed UE arch ring with LUE as this arm has more movement beginning with CGA/Min A with distal support from therapist to move rings and needing Min/Mod by end of activity. Therapist performed PROM for RUE within pt's tolerance for shoulder, elbow, wrist and digits in all planes but pt could only tolerate minimal movement d/t pain. Mirror provided for feedback as pt placed BUEs on either side of beach ball, therapist assist for shoulder flexion/elbow extension for forward reaching to promote active movement, 2x10. Pt fatigued, brought back to room and SPT to bed with Min A without AD, sit to supine CGA/Min and positioned for comfort with RUE elevated. RUE elevated and shoulder joint supported throughout session with pillows. Call bell in reach, alarm on.   Therapy Documentation Precautions:  Precautions Precautions: Fall Precaution Comments: NPO Restrictions Weight Bearing Restrictions: No      Therapy/Group:  Individual Therapy  Viona Gilmore 02/04/2020, 2:52 PM

## 2020-02-04 NOTE — Progress Notes (Addendum)
[place] PHYSICAL MEDICINE & REHABILITATION PROGRESS NOTE   Subjective/Complaints: Pt up last night irritated by NGT as well her right arm which is bothering her more with tingling/burning pain. Admits to be anxious at times (husband too)  ROS: Patient denies fever, rash, sore throat, blurred vision, nausea, vomiting, diarrhea, cough, shortness of breath or chest pain .    Objective:   DG Swallowing Func-Speech Pathology  Result Date: 02/04/2020 Objective Swallowing Evaluation: Type of Study: MBS-Modified Barium Swallow Study  Patient Details Name: Erika Stanton MRN: [id number] Date of Birth: 10-16-1955 Today's Date: 02/04/2020 Time: SLP Start Time (ACUTE ONLY): 0930 -SLP Stop Time (ACUTE ONLY): 1000 SLP Time Calculation (min) (ACUTE ONLY): 30 min Past Medical History: No past medical history on file. Past Surgical History: Past Surgical History: Procedure Laterality Date . CHOLECYSTECTOMY   . COLONOSCOPY  11/2017  at [facility]. no recurrent polyps.  suggest repeat surveillance study 11/2022.   Marland Kitchen COLONOSCOPY W/ POLYPECTOMY  06/2014  Dr Roxy Manns at [facility].  3 adenomatoous polyps, anal fissure.   . ESOPHAGOGASTRODUODENOSCOPY (EGD) WITH PROPOFOL N/A 01/27/2020  Procedure: ESOPHAGOGASTRODUODENOSCOPY (EGD) WITH PROPOFOL;  Surgeon: Mauri Pole, MD;  Location: [place] [facility];  Service: Endoscopy;  Laterality: N/A; . IR 3D INDEPENDENT WKST  01/06/2020 . IR ANGIO INTRA EXTRACRAN SEL INTERNAL CAROTID BILAT MOD SED  01/06/2020 . IR ANGIO VERTEBRAL SEL VERTEBRAL UNI L MOD SED  01/06/2020 . IR ANGIOGRAM FOLLOW UP STUDY  01/06/2020 . IR ANGIOGRAM FOLLOW UP STUDY  01/06/2020 . IR ANGIOGRAM FOLLOW UP STUDY  01/06/2020 . IR ANGIOGRAM FOLLOW UP STUDY  01/06/2020 . IR ANGIOGRAM FOLLOW UP STUDY  01/06/2020 . IR ANGIOGRAM FOLLOW UP STUDY  01/06/2020 . IR ANGIOGRAM FOLLOW UP STUDY  01/06/2020 . IR ANGIOGRAM FOLLOW UP STUDY  01/06/2020 . IR ANGIOGRAM FOLLOW UP STUDY  01/06/2020 . IR ANGIOGRAM FOLLOW UP STUDY  01/06/2020 . IR NEURO EACH ADD'L  AFTER BASIC UNI RIGHT (MS)  01/06/2020 . IR TRANSCATH/EMBOLIZ  01/06/2020 . RADIOLOGY WITH ANESTHESIA N/A 01/06/2020  Procedure: IR WITH ANESTHESIA FOR ANEURYSM;  Surgeon: Consuella Lose, MD;  Location: [place];  Service: Radiology;  Laterality: N/A; . TUBAL LIGATION   HPI: See H&P  Subjective: sleepy Assessment / Plan / Recommendation CHL IP CLINICAL IMPRESSIONS 02/04/2020 Clinical Impression Although patient reports difficulty with swallowing and displays consistent overt s/s of aspiration/penetration at the bedside, patient's overall swallowing function appears slightly improved since last MBS.  Patient was lethargic throughout study and required encouragement for full participation resulting in patient declining attempting to feed herself with her left hand or take "bigger" bites/sips.  Patient continues to demonstrate impaired epiglottic inversion, anterior laryngeal mobility and pharyngeal constriction resulting in mild vallecular residue with both solids and liquids that was reduced with additional swallows.  Patient with 1 episode of penetration to the cords with thin liquids that was sensed and cleared and mild high intermittent penetration that was reduced with a chin tuck.  Suspect patient's overall swallowing function will continue to improve with removal of NG tube. Recommend patient initiate a diet of Dys. 1 textures with thin liquids with goal of upgrading solids quickly as cognitive functioning continues to improve in order to maximize PO intake and aid in removal of NG tube. SLP Visit Diagnosis -- Attention and concentration deficit following -- Frontal lobe and executive function deficit following -- Impact on safety and function --   CHL IP TREATMENT RECOMMENDATION 02/04/2020 Treatment Recommendations Therapy as outlined in treatment plan below   Prognosis 02/04/2020  Prognosis for Safe Diet Advancement Good Barriers to Reach Goals Cognitive deficits Barriers/Prognosis Comment -- CHL IP DIET  RECOMMENDATION 02/04/2020 SLP Diet Recommendations Dysphagia 1 (Puree) solids;Thin liquid Liquid Administration via Spoon;Straw Medication Administration Crushed with puree Compensations Minimize environmental distractions;Slow rate;Small sips/bites;Monitor for anterior loss;Multiple dry swallows after each bite/sip Postural Changes Seated upright at 90 degrees   CHL IP OTHER RECOMMENDATIONS 02/04/2020 Recommended Consults -- Oral Care Recommendations Oral care BID Other Recommendations Order thickener from pharmacy;Prohibited food (jello, ice cream, thin soups);Remove water pitcher;Have oral suction available   CHL IP FOLLOW UP RECOMMENDATIONS 02/04/2020 Follow up Recommendations Inpatient Rehab   CHL IP FREQUENCY AND DURATION 02/04/2020 Speech Therapy Frequency (ACUTE ONLY) min 3x week Treatment Duration 3 weeks      CHL IP ORAL PHASE 02/04/2020 Oral Phase Impaired Oral - Pudding Teaspoon -- Oral - Pudding Cup -- Oral - Honey Teaspoon NT Oral - Honey Cup -- Oral - Nectar Teaspoon Lingual/palatal residue Oral - Nectar Cup -- Oral - Nectar Straw Lingual/palatal residue Oral - Thin Teaspoon Lingual/palatal residue Oral - Thin Cup -- Oral - Thin Straw Lingual/palatal residue Oral - Puree WFL Oral - Mech Soft -- Oral - Regular -- Oral - Multi-Consistency -- Oral - Pill -- Oral Phase - Comment --  CHL IP PHARYNGEAL PHASE 02/04/2020 Pharyngeal Phase Impaired Pharyngeal- Pudding Teaspoon -- Pharyngeal -- Pharyngeal- Pudding Cup -- Pharyngeal -- Pharyngeal- Honey Teaspoon NT Pharyngeal -- Pharyngeal- Honey Cup -- Pharyngeal -- Pharyngeal- Nectar Teaspoon Reduced anterior laryngeal mobility;Reduced epiglottic inversion;Reduced pharyngeal peristalsis;Pharyngeal residue - valleculae Pharyngeal -- Pharyngeal- Nectar Cup -- Pharyngeal -- Pharyngeal- Nectar Straw Reduced epiglottic inversion;Reduced pharyngeal peristalsis;Reduced anterior laryngeal mobility;Pharyngeal residue - valleculae Pharyngeal -- Pharyngeal- Thin Teaspoon  Reduced anterior laryngeal mobility;Reduced epiglottic inversion;Pharyngeal residue - valleculae Pharyngeal -- Pharyngeal- Thin Cup -- Pharyngeal -- Pharyngeal- Thin Straw Reduced anterior laryngeal mobility;Reduced pharyngeal peristalsis;Reduced epiglottic inversion;Penetration/Aspiration during swallow Pharyngeal Material does not enter airway;Material enters airway, CONTACTS cords and then ejected out;Material enters airway, remains ABOVE vocal cords and not ejected out Pharyngeal- Puree Reduced anterior laryngeal mobility;Reduced epiglottic inversion;Reduced pharyngeal peristalsis;Pharyngeal residue - valleculae Pharyngeal -- Pharyngeal- Mechanical Soft -- Pharyngeal -- Pharyngeal- Regular -- Pharyngeal -- Pharyngeal- Multi-consistency -- Pharyngeal -- Pharyngeal- Pill -- Pharyngeal -- Pharyngeal Comment --  CHL IP CERVICAL ESOPHAGEAL PHASE 02/04/2020 Cervical Esophageal Phase WFL Pudding Teaspoon -- Pudding Cup -- Honey Teaspoon -- Honey Cup -- Nectar Teaspoon -- Nectar Cup -- Nectar Straw -- Thin Teaspoon -- Thin Cup -- Thin Straw -- Puree -- Mechanical Soft -- Regular -- Multi-consistency -- Pill -- Cervical Esophageal Comment -- PAYNE, COURTNEY 02/04/2020, 10:52 AM    Weston Anna, MA, CCC-SLP [phone]           No results for input(s): WBC, HGB, HCT, PLT in the last 72 hours. No results for input(s): NA, K, CL, CO2, GLUCOSE, BUN, CREATININE, CALCIUM in the last 72 hours.  Intake/Output Summary (Last 24 hours) at 02/04/2020 1102 Last data filed at 02/04/2020 0657 Gross per 24 hour  Intake 1616.75 ml  Output 2300 ml  Net -683.25 ml     Physical Exam: Vital Signs Blood pressure 137/83, pulse 89, temperature 98.1 F (36.7 C), temperature source Oral, resp. rate 16, height 5\' 6"  (1.676 m), weight 89.5 kg, SpO2 98 %.  Constitutional: No distress . Vital signs reviewed. HEENT: EOMI, oral membranes moist, NGT Neck: supple Cardiovascular: RRR without murmur. No JVD    Respiratory/Chest: CTA  Bilaterally without wheezes or rales. Normal effort    GI/Abdomen:  BS +, non-tender, non-distended Ext: no clubbing, cyanosis, or edema Psych: pleasant and cooperative Psych: pleasant and cooperative Neurologic: improved speech clarity and volume continue. Good sitting balance on her own. Trace to 1/5 prox right UE, otherwise 0/5 RUE, RLE 0/5, LUE 2+/5 , LLE 4/5. Right arm sensitive to touch, ?decreased LT discrimination Musculoskeletal: No pain with AROM/PROM BUE and BLE . No joint swelling  Skin: numerous bruises along limbs and abdomen which are improving    Assessment/Plan: 1. 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  Physiatrist is providing close team supervision and 24 hour management of active medical problems listed below.  Physiatrist and rehab team continue to assess barriers to discharge/monitor patient progress toward functional and medical goals  Care Tool:  Bathing    Body parts bathed by patient: Front perineal area, Abdomen, Left upper leg, Right upper leg   Body parts bathed by helper: Buttocks, Right lower leg, Left lower leg, Face, Right arm, Left arm     Bathing assist Assist Level: Maximal Assistance - Patient 24 - 49%     Upper Body Dressing/Undressing Upper body dressing   What is the patient wearing?: Pull over shirt    Upper body assist Assist Level: Maximal Assistance - Patient 25 - 49%    Lower Body Dressing/Undressing Lower body dressing      What is the patient wearing?: Pants, Incontinence brief     Lower body assist Assist for lower body dressing: Maximal Assistance - Patient 25 - 49%     Toileting Toileting    Toileting assist Assist for toileting: Dependent - Patient 0%     Transfers Chair/bed transfer  Transfers assist     Chair/bed transfer assist level: Minimal Assistance - Patient >  75%     Locomotion Ambulation   Ambulation assist      Assist level: Minimal Assistance - Patient > 75% Assistive device: No Device Max distance: 25   Walk 10 feet activity   Assist     Assist level: Minimal Assistance - Patient > 75% Assistive device: No Device   Walk 50 feet activity   Assist Walk 50 feet with 2 turns activity did not occur: Safety/medical concerns         Walk 150 feet activity   Assist Walk 150 feet activity did not occur: Safety/medical concerns         Walk 10 feet on uneven surface  activity   Assist Walk 10 feet on uneven surfaces activity did not occur: Safety/medical concerns         Wheelchair     Assist Will patient use wheelchair at discharge?: Yes Type of Wheelchair: Manual           Wheelchair 50 feet with 2 turns activity    Assist    Wheelchair 50 feet with 2 turns activity did not occur: Safety/medical concerns (requires TIS for postural support)       Wheelchair 150 feet activity     Assist  Wheelchair 150 feet activity did not occur: Safety/medical concerns       Blood pressure 137/83, pulse 89, temperature 98.1 F (36.7 C), temperature source Oral, resp. rate 16, height 5\' 6"  (1.676 m), weight 89.5 kg, SpO2 98 %.  Medical Problem List and Plan: 1.  Right hemiparesis, LUE paresis, dysphagia, cognitive deficits with disorientation, unintelligible speech lethargy secondary to bilateral intracranial hemorrhages with subsequent  infarcts.  Continue CIR, PT, OT, SLP     -making gains in speech, mobility 2.  Antithrombotics: -DVT/anticoagulation:  Pharmaceutical: Heparin--hold                          Dopplers negative for DVT.              -antiplatelet therapy: DAPT--resumed plavix, asa     -hgb holding  3. Pain Management: Tylenol prn.    -add gabapentin 100mg  tid for neuropathic pain 4. Mood: LCSW to follow for evaluation and support when appropriate.              -antipsychotic  agents: N/A 5. Neuropsych: This patient is not capable of making decisions on her own behalf.  Continue trial of ritalin  for concentration and arousal    -continue with 5mg  dose for now   -arousal and mentation gradually improving 6. Skin/Wound Care: routine pressure relief measures.  7. Fluids/Electrolytes/Nutrition: NPO       -9/20 continue TF but cut to half rate to stimulate appetite  -h2o flushes continue  -off IVF 8. ABLA/UGIB:     EGD 9/15 apparently demonstrated non-bleeding esophagitis/gastritis, HH   -husband doesn't want to pursue further testing  9/17: resumed plavix/asa. Will not resume sq heparin    -protonix per tube now (or IV if needed today)   -GI consult appreciated!   -hgb holding at 11.7 again 9/20 after 2u PRBC   -recheck cbc Friday as these have not been drawn yet 9. 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 Resting tachycardia:              has been well controlled.  12. Vasospasm prevention: To continue Nimotop for 3 additional days.  13. Bowel incontinence/loose stool: multifactorial, on abx for uti also  -  fiber/probiotic  -9/23  cholestyramine TID--dc   14. Proteus UTI/enteroccus UTI: Ceftriaxone course completed for proteus  -amoxil x 5 days for enterococcus to complete 9/24 18.  Prediabetes, confounded by tube feeds  Relatively controlled.   Continue to monitor 19.  Leukoctyosis: resolved  -rx uti      LOS: 15 days A FACE TO [place] 02/04/2020, 11:02 AM

## 2020-02-04 NOTE — Progress Notes (Signed)
Physical Therapy Weekly Progress Note  Patient Details  Name: Erika Stanton MRN: [id number] Date of Birth: Jul 12, 1955  Beginning of progress report period: January 27, 2020 End of progress report period: February 04, 2020  Today's Date: 02/04/2020 PT Individual Time: 8144-8185 PT Individual Time Calculation (min): 46 min   and  Today's Date: 02/04/2020 PT Missed Time: 14 Minutes Missed Time Reason: Patient fatigue  Patient has met 3 of 3 short term goals. Erika Stanton has made significant progress with therapy this week demonstrating improvements in all functional mobility tasks. She is performing supine<>sit with min assist, sit<>stands with min/mod assist pending pt's fatigue level, stand pivot transfers no AD with min assist, and ambulating up to 68f no AD with mod assist of 1. She continues to demonstrate impairments as listed below and has significant activity tolerance/endurance impairments as well.  Patient continues to demonstrate the following deficits muscle weakness, decreased cardiorespiratoy endurance, impaired timing and sequencing, unbalanced muscle activation, decreased coordination and decreased motor planning, decreased visual perceptual skills, decreased attention to right and right side neglect, decreased initiation, decreased attention, decreased awareness, decreased problem solving, decreased safety awareness, decreased memory and delayed processing,   and decreased sitting balance, decreased standing balance, decreased postural control and decreased balance strategies and therefore will continue to benefit from skilled PT intervention to increase functional independence with mobility.  Patient progressing toward long term goals and based on pt's progress this week was able to reactivate ambulatory and stair goals as well as upgrade all LTGs.  Continue plan of care.  PT Short Term Goals Week 2:  PT Short Term Goal 1 (Week 2): Pt will complete bed<>w/c with max assist +1  consistently. PT Short Term Goal 1 - Progress (Week 2): Met PT Short Term Goal 2 (Week 2): Pt will demonstrate static sitting balance x 5 minutes with min assist. PT Short Term Goal 2 - Progress (Week 2): Met PT Short Term Goal 3 (Week 2): Pt will consistently complete bed mobility with max assist +1. PT Short Term Goal 3 - Progress (Week 2): Met Week 3:  PT Short Term Goal 1 (Week 3): Pt will perform supine<>sit with CGA PT Short Term Goal 2 (Week 3): Pt will perform sit<>stands using LRAD with CGA PT Short Term Goal 3 (Week 3): Pt will perform bed<>chair transfers using LRAD with CGA PT Short Term Goal 4 (Week 3): Pt will ambulate at least 287fusing LRAD with min assist PT Short Term Goal 5 (Week 3): Pt will ascend/descend 4 steps using HRs with mod assist  Skilled Therapeutic Interventions/Progress Updates:  Ambulation/gait training;DME/adaptive equipment instruction;Neuromuscular re-education;Stair training;UE/LE Strength taining/ROM;Wheelchair propulsion/positioning;Balance/vestibular training;Discharge planning;Pain management;Skin care/wound management;Therapeutic Activities;UE/LE Coordination activities;Cognitive remediation/compensation;Functional mobility training;Patient/family education;Splinting/orthotics;Therapeutic Exercise;Visual/perceptual remediation/compensation;Functional electrical stimulation;Disease management/prevention;Community reintegration;Psychosocial support   Pt received supine in bed and reports being "very tired" but with encouragement agreeable to limited therapy session. Supine>sitting R EOB, HOB partially elevated, with close supervision. Sitting EOB donned shoes max assist. L stand pivot EOB>TIS w/c with min assist for lifting into standing and for balance while turning.  Transported to/from gym in w/c for time management and energy conservation. Therapist retrieved pt a standard 18x18 wheelchair as TIS feature no longer necessary given pt declines to sit in w/c  outside of therapy sessions and demos improving trunk control and transfers  - educated pt on attempting to sit in recliner during day to promote upright, OOB activity tolerance, pt will continue to require additional education and encouragement for this. Pt requires  min/mod assist for sit<>stands with increasing assist for coming to stand towards end of session as pt fatigues. Gait training ~74f x2 with min/mod assist for balance - demos decreased B LE foot clearance and step lengths with pt maintaining slight squat posturing with flexed knees and increased postural sway. Pt reports "that's good enough" requiring significant encouragement/motivation to continue session. Educated on importance of progressing towards stair navigation training for home entry. Performed alternate B LE foot taps on 1st 6" step using L UE support on HR and min assist for balance x8 reps. Pt states "it's hard today"..."I didn't sleep at all" and politely but adamantly requesting to return to room. Transported back in w/c. Therapist noticed R hemiparetic shoulder appears to have anteriorly translated humerus - pt may benefit from sling or taping to improve humerus alignment. Short distance gait ~557fw/c>EOB with min assist for balance. Sit>supine with CGA for trunk descent. Pt left supine in bed with needs in reach, R UE supported on pillows, and bed alarm on. Missed 14 minutes of skilled physical therapy.  Therapy Documentation Precautions:  Precautions Precautions: Fall Precaution Comments: NPO Restrictions Weight Bearing Restrictions: No   Pain:   Reports R shoulder pain with any movement - recommend trying a sling or taping for increased joint support in future sessions - avoided excessive movement during this session for pain management.   Therapy/Group: Individual Therapy  CaTawana Stanton PT, DPT, CSRS  02/04/2020, 4:03 PM

## 2020-02-04 NOTE — Progress Notes (Signed)
Occupational Therapy Session Note  Patient Details  Name: Erika Stanton MRN: [id number] Date of Birth: 05-22-1955  Today's Date: 02/04/2020 OT Individual Time: 0830-0900 OT Individual Time Calculation (min): 30 min    Short Term Goals: Week 2:  OT Short Term Goal 1 (Week 2): Pt will use L hand with mod A to wash 50 % of her UB. OT Short Term Goal 2 (Week 2): Pt will be able to sit upright on  a BSC with min A. OT Short Term Goal 3 (Week 2): Pt will be able to squat pivot with mod A of 2 to a BSC. OT Short Term Goal 4 (Week 2): Pt will be able to sit to stand with mod A of 2 to increase skills needed for LB dressing.  Skilled Therapeutic Interventions/Progress Updates:    Pt received in bed wanting to get dressed and ready for her MBS today.  She was very anxious about the test, but was able to focus on therapy. Today pt did extremely well using her legs and core muscles to sit to EOB, sit at EOB with good posture, sit to stand several times and stand pivot all with CGA to min A.   She continues to need max with dressing due to painful RUE with trace movement and limited LUE due to BUE plegia. Pt adjusted in w/c and transport arrived to take pt to her test.   Therapy Documentation Precautions:  Precautions Precautions: Fall Precaution Comments: NPO Restrictions Weight Bearing Restrictions: No   Pain: Pain Assessment Faces Pain Scale: Hurts whole lot Pain Type: Neuropathic pain Pain Location: Arm Pain Orientation: Right Pain Descriptors / Indicators: Aching;Sharp Pain Onset: On-going Pain Intervention(s): Repositioned ADL: ADL Eating: NPO Grooming: Dependent Where Assessed-Grooming: Bed level Upper Body Bathing: Maximal assistance Where Assessed-Upper Body Bathing: Shower Lower Body Bathing: Maximal assistance Where Assessed-Lower Body Bathing: Shower Upper Body Dressing: Maximal assistance Where Assessed-Upper Body Dressing: Wheelchair Lower Body Dressing: Maximal  assistance Where Assessed-Lower Body Dressing: Wheelchair, Standing at sink, Sitting at sink Toileting: Dependent Where Assessed-Toileting: Bed level Social research officer, government: Minimal assistance Social research officer, government Method: Stand pivot ADL Comments: patient able to hold wash cloth with left hand but unable to manipulate for washing, she is able to lift each leg for donning pants/socks   Therapy/Group: Individual Therapy  [place]rey 02/04/2020, 9:29 AM

## 2020-02-05 ENCOUNTER — Inpatient Hospital Stay (HOSPITAL_COMMUNITY): Payer: BC Managed Care – PPO | Admitting: Physical Therapy

## 2020-02-05 ENCOUNTER — Inpatient Hospital Stay (HOSPITAL_COMMUNITY): Payer: BC Managed Care – PPO | Admitting: Speech Pathology

## 2020-02-05 ENCOUNTER — Inpatient Hospital Stay (HOSPITAL_COMMUNITY): Payer: BC Managed Care – PPO | Admitting: Occupational Therapy

## 2020-02-05 LAB — CBC
HCT: 45.6 % (ref 36.0–46.0)
Hemoglobin: 14.8 g/dL (ref 12.0–15.0)
MCH: 31.4 pg (ref 26.0–34.0)
MCHC: 32.5 g/dL (ref 30.0–36.0)
MCV: 96.6 fL (ref 80.0–100.0)
Platelets: 132 10*3/uL — ABNORMAL LOW (ref 150–400)
RBC: 4.72 MIL/uL (ref 3.87–5.11)
RDW: 13.3 % (ref 11.5–15.5)
WBC: 3.9 10*3/uL — ABNORMAL LOW (ref 4.0–10.5)
nRBC: 0 % (ref 0.0–0.2)

## 2020-02-05 LAB — BASIC METABOLIC PANEL
Anion gap: 10 (ref 5–15)
BUN: 13 mg/dL (ref 8–23)
CO2: 25 mmol/L (ref 22–32)
Calcium: 9.6 mg/dL (ref 8.9–10.3)
Chloride: 103 mmol/L (ref 98–111)
Creatinine, Ser: 0.56 mg/dL (ref 0.44–1.00)
GFR calc Af Amer: >60 mL/min (ref >60)
GFR calc non Af Amer: >60 mL/min (ref >60)
Glucose, Bld: 88 mg/dL (ref 70–99)
Potassium: 4.3 mmol/L (ref 3.5–5.1)
Sodium: 138 mmol/L (ref 135–145)

## 2020-02-05 LAB — GLUCOSE, CAPILLARY
Glucose-Capillary: 100 mg/dL — ABNORMAL HIGH (ref 70–99)
Glucose-Capillary: 138 mg/dL — ABNORMAL HIGH (ref 70–99)
Glucose-Capillary: 90 mg/dL (ref 70–99)
Glucose-Capillary: 90 mg/dL (ref 70–99)
Glucose-Capillary: 94 mg/dL (ref 70–99)
Glucose-Capillary: 97 mg/dL (ref 70–99)

## 2020-02-05 MED ORDER — ALUM & MAG HYDROXIDE-SIMETH 200-200-20 MG/5ML PO SUSP: 30.0000 mL | ORAL | Status: DC | PRN

## 2020-02-05 MED ORDER — POTASSIUM CHLORIDE 20 MEQ/15ML (10%) PO SOLN
40.0000 meq | Freq: Every day | ORAL | Status: DC
  Administered 2020-02-05 – 2020-02-10 (×6): 40 meq via ORAL
  Filled 2020-02-05 (×7): qty 30

## 2020-02-05 MED ORDER — GUAIFENESIN-DM 100-10 MG/5ML PO SYRP: 5.0000 mL | ORAL_SOLUTION | Freq: Four times a day (QID) | ORAL | Status: DC | PRN

## 2020-02-05 MED ORDER — INSULIN ASPART 100 UNIT/ML ~~LOC~~ SOLN: 0.0000 [IU] | Freq: Three times a day (TID) | SUBCUTANEOUS | Status: DC

## 2020-02-05 MED ORDER — AMOXICILLIN 250 MG/5ML PO SUSR
250.0000 mg | Freq: Three times a day (TID) | ORAL | Status: AC
  Administered 2020-02-05 (×2): 250 mg via ORAL
  Filled 2020-02-05 (×3): qty 5

## 2020-02-05 MED ORDER — PANTOPRAZOLE SODIUM 40 MG PO TBEC
40.0000 mg | DELAYED_RELEASE_TABLET | Freq: Two times a day (BID) | ORAL | Status: DC
  Administered 2020-02-05 – 2020-02-23 (×37): 40 mg via ORAL
  Filled 2020-02-05 (×36): qty 1

## 2020-02-05 MED ORDER — ACETAMINOPHEN 325 MG PO TABS
325.0000 mg | ORAL_TABLET | ORAL | Status: DC | PRN
  Administered 2020-02-05 – 2020-02-15 (×5): 650 mg via ORAL
  Administered 2020-02-16: 325 mg via ORAL
  Administered 2020-02-16: 650 mg via ORAL
  Administered 2020-02-17 – 2020-02-18 (×2): 325 mg via ORAL
  Administered 2020-02-19 – 2020-02-23 (×5): 650 mg via ORAL
  Filled 2020-02-05: qty 2
  Filled 2020-02-05: qty 1
  Filled 2020-02-05 (×4): qty 2
  Filled 2020-02-05: qty 1
  Filled 2020-02-05: qty 2
  Filled 2020-02-05: qty 1
  Filled 2020-02-05 (×6): qty 2

## 2020-02-05 MED ORDER — ASPIRIN 325 MG PO TABS
325.0000 mg | ORAL_TABLET | Freq: Every day | ORAL | Status: DC
  Administered 2020-02-05 – 2020-02-23 (×19): 325 mg via ORAL
  Filled 2020-02-05 (×19): qty 1

## 2020-02-05 MED ORDER — PROCHLORPERAZINE 25 MG RE SUPP: 12.5000 mg | Freq: Four times a day (QID) | RECTAL | Status: DC | PRN

## 2020-02-05 MED ORDER — TRAZODONE HCL 50 MG PO TABS: 25.0000 mg | ORAL_TABLET | Freq: Every evening | ORAL | Status: DC | PRN

## 2020-02-05 MED ORDER — ENSURE ENLIVE PO LIQD
237.0000 mL | Freq: Three times a day (TID) | ORAL | Status: DC
  Administered 2020-02-05 – 2020-02-07 (×5): 237 mL via ORAL

## 2020-02-05 MED ORDER — PROCHLORPERAZINE EDISYLATE 10 MG/2ML IJ SOLN: 5.0000 mg | Freq: Four times a day (QID) | INTRAMUSCULAR | Status: DC | PRN

## 2020-02-05 MED ORDER — CLOPIDOGREL BISULFATE 75 MG PO TABS
75.0000 mg | ORAL_TABLET | Freq: Every day | ORAL | Status: DC
  Administered 2020-02-05 – 2020-02-23 (×19): 75 mg via ORAL
  Filled 2020-02-05 (×19): qty 1

## 2020-02-05 MED ORDER — PROCHLORPERAZINE MALEATE 5 MG PO TABS: 5.0000 mg | ORAL_TABLET | Freq: Four times a day (QID) | ORAL | Status: DC | PRN

## 2020-02-05 NOTE — Progress Notes (Signed)
Speech Language Pathology Weekly Progress and Session Note  Patient Details  Name: Erika Stanton MRN: [id number] Date of Birth: 20-May-1955  Beginning of progress report period: January 29, 2020 End of progress report period: February 05, 2020  Today's Date: 02/05/2020 SLP Individual Time: 9892-1194 SLP Individual Time Calculation (min): 60 min  Short Term Goals: Week 2: SLP Short Term Goal 1 (Week 2): Patient will increase speech intelligibility to ~75% at the phrase level with Min A multimodal cues for use of speech intelligibility strategies. SLP Short Term Goal 1 - Progress (Week 2): Met SLP Short Term Goal 2 (Week 2): Patient will attend to a functional task for ~10 minutes with Max A multimodal cues. SLP Short Term Goal 2 - Progress (Week 2): Met SLP Short Term Goal 3 (Week 2): Patient will consume current diet with minimal overt s/s of aspiration and overall Mod A verbal cues for use of swallowing compensatory strategies. SLP Short Term Goal 3 - Progress (Week 2): Not met SLP Short Term Goal 4 (Week 2): Patient will conusme trials of thin liquids via tsp without overt s/s of aspiraiton over 3 sessions with Mod verbla cues to assess readiness for repeat MBS. SLP Short Term Goal 4 - Progress (Week 2): Not met    New Short Term Goals: Week 3: SLP Short Term Goal 1 (Week 3): Patient will consume current diet with minimal overt s/s of aspiration and overall Min A verbal cues for use of swallowing compensatory strategies. SLP Short Term Goal 2 (Week 3): Patient will demonstrate efficient mastication with trials of Dys. 2 textures without overt s/s of aspiration with Min A verbal cues over 2 sessions prior to upgrade. SLP Short Term Goal 3 (Week 3): Patient will demonstrate sustained attention to functional tasks for 10 minutes with Mod verbal cues for redirection. SLP Short Term Goal 4 (Week 3): Patient will demonstrate functional problem solving for basic and familiar tasks with Mod A  verbal cues. SLP Short Term Goal 5 (Week 3): Patient will recall new, daily information with Mod A verbal cues for use of external aids.  Weekly Progress Updates: Patient has made functional but inconsistent gains and has met 2 of 4 STGs this reporting period. Patient was initially tolerating her new diet of Dys. 1 textures with nectar-thick liquids without overt s/s of aspiration. However, an NG tube was placed for alternative means of nutrition due to decreased appetite and PO intake which increased her overt s/s of aspiration with reports of discomfort. SLP repeated an MBS which showed a slight improvement in swallowing function, therefore, a diet of Dys. 1 textures and thin liquids was initiated with the NG tube removed in order to maximize comfort and overall PO intake. Patient demonstrates increased speech intelligibility at the sentence level and is ~90% intelligible with overall Min A verbal cues. Patient demonstrates improved arousal and requires overall Max A verbal cues for sustained attention and functional problem solving/reasoning. Patient and family education ongoing. Patient would benefit from continued skilled SLP intervention to maximize her cognitive, swallowing and speech function prior to discharge.     Intensity: Minumum of 1-2 x/day, 30 to 90 minutes Frequency: 3 to 5 out of 7 days Duration/Length of Stay: 02/23/20 Treatment/Interventions: Cognitive remediation/compensation;Dysphagia/aspiration precaution training;Internal/external aids;Speech/Language facilitation;Therapeutic Activities;Environmental controls;Cueing hierarchy;Functional tasks;Patient/family education   Daily Session  Skilled Therapeutic Interventions: Skilled treatment session focused on dysphagia and cognitive goals. SLP facilitated session by providing Mod A verbal cues for problem solving while donning pants and for  transfer to the recliner to maximize positioning for PO intake. Patient consumed Dys. 1  textures with thin liquids with intermittent, subtle throat clearing with cues needed for use of multiple swallows which appeared to decrease throat clearing. Recommend patient continue current diet. SLP provided Mod A verbal cues for encouragement with PO intake and sustained attention to meal with patient asking if she ate 50% after only 4 bites. Patient left upright in the recliner with alarm on and all needs within reach. Continue with current plan of care.     Pain No/Denies Pain   Therapy/Group: Individual Therapy  Zuley Lutter 02/05/2020, 6:26 AM

## 2020-02-05 NOTE — Progress Notes (Signed)
Physical Therapy Session Note  Patient Details  Name: Erika Stanton MRN: [id number] Date of Birth: [birth year]  Today's Date: 02/05/2020 PT Individual Time: 1751-0258 PT Individual Time Calculation (min): 73 min   Short Term Goals: Week 3:  PT Short Term Goal 1 (Week 3): Pt will perform supine<>sit with CGA PT Short Term Goal 2 (Week 3): Pt will perform sit<>stands using LRAD with CGA PT Short Term Goal 3 (Week 3): Pt will perform bed<>chair transfers using LRAD with CGA PT Short Term Goal 4 (Week 3): Pt will ambulate at least 11f using LRAD with min assist PT Short Term Goal 5 (Week 3): Pt will ascend/descend 4 steps using HRs with mod assist  Skilled Therapeutic Interventions/Progress Updates:   Pt received supine in bed and agreeable to PT. Supine>sit transfer with min assist at trunk to pull in to long sitting.  Pt requested assistance EOB to don shoes. Able to maintain sitting balance with supervision assist at EOB while lifting Bil LE to place in shoes.   [facility] without AD 2 x 271fwith min-mod assist from PT. Pt noted to have narrow BOS and mild L lateral lean, able to correct with cues from PT for a couple steps prior to requiring additional cues for gait pattern. Pt noted to hold RUE in dependent position. PT then instructed pt in gait training with RW and R hand splint to force WB through RUE. Noted to have improve hand and elbow position x 2054fbut reports signicant pain in the R shoulder with use of hand splint.   Dynamic balance training/sustain attention and fine motor task of Wii resort bowling (100 pin rep frame). Min assist overall for balance and min-mod for use of the LUE to control Wii remote. Pt able to sustain standing for 5 frames intermittently throughout treatment with max encouragement from PT for continued participation.   Pt returned to room and performed stand pivot  transfer to bed with no Ad and min assist. Sit>supine completed with supervision assist and increased time for LLE management and left supine in bed with call bell in reach and all needs met.           Therapy Documentation Precautions:  Precautions Precautions: Fall Precaution Comments: NPO Restrictions Weight Bearing Restrictions: No    Vital Signs: Therapy Vitals Temp: 98.4 F (36.9 C) Pulse Rate: (!) 107 Resp: 16 BP: (!) 131/97 Patient Position (if appropriate): Sitting Oxygen Therapy SpO2: 97 % O2 Device: Room Air Pain: Denies at rest    Therapy/Group: Individual Therapy  AusLorie Phenix24/2021, 4:49 PM

## 2020-02-05 NOTE — Progress Notes (Signed)
Occupational Therapy Weekly Progress Note  Patient Details  Name: Erika Stanton MRN: [id number] Date of Birth: [birth year]  Beginning of progress report period: January 28, 2020 End of progress report period: February 05, 2020  Today's Date: 02/05/2020 OT Individual Time: 1771-1657 OT Individual Time Calculation (min): 75 min    Patient has met 4 of 4 short term goals.  Pt has made excellent progress this week. She is now able to stand up and transfer with min A.  Significant improvement in postural control to be able to sit and reach with close S.  LUE is now able to reach over head and pt is able to grasp and release objects.  RUE continues to be very painful but pt does have some active movement in her arm.  Patient continues to demonstrate the following deficits: muscle weakness and muscle joint tightness, decreased cardiorespiratoy endurance, abnormal tone, unbalanced muscle activation and decreased coordination, decreased visual acuity and decreased sitting balance, decreased standing balance, hemiplegia and decreased balance strategies and therefore will continue to benefit from skilled OT intervention to enhance overall performance with BADL.  Patient progressing toward long term goals..  Continue plan of care.  OT Short Term Goals Week 1:  OT Short Term Goal 1 (Week 1): patient will roll in bed with min A, move from side lying to sitting edge of bed with mod A of one OT Short Term Goal 1 - Progress (Week 1): Met OT Short Term Goal 2 (Week 1): patient will tolerate unsupported sitting with min A for 10 minutes with head midline OT Short Term Goal 2 - Progress (Week 1): Met OT Short Term Goal 3 (Week 1): patient will use left UE to wash 25% of upper body and assist with donning [place] shirt max A OT Short Term Goal 3 - Progress (Week 1): Progressing toward goal OT Short Term Goal 4 (Week 1): patient will scan environment both right and left to locate items with moderate cues OT Short  Term Goal 4 - Progress (Week 1): Met Week 2:  OT Short Term Goal 1 (Week 2): Pt will use L hand with mod A to wash 50 % of her UB. OT Short Term Goal 1 - Progress (Week 2): Met OT Short Term Goal 2 (Week 2): Pt will be able to sit upright on  a BSC with min A. OT Short Term Goal 2 - Progress (Week 2): Met OT Short Term Goal 3 (Week 2): Pt will be able to squat pivot with mod A of 2 to a BSC. OT Short Term Goal 3 - Progress (Week 2): Met OT Short Term Goal 4 (Week 2): Pt will be able to sit to stand with mod A of 2 to increase skills needed for LB dressing. OT Short Term Goal 4 - Progress (Week 2): Met Week 3:  OT Short Term Goal 1 (Week 3): Pt will be able to use LUE to wash R arm, torso and LEs with CGA. OT Short Term Goal 2 (Week 3): Pt will use LUE to pull  a shirt on and off over her head. OT Short Term Goal 3 (Week 3): Pt will be able to tolerate self ROM to RUE with min A.  Skilled Therapeutic Interventions/Progress Updates:    Pt received in bed stating she only wanted to change shirts and wanted to wait another day or so to shower again.  She had clean pants on.  Max to doff and on shirt due to R arm  pain and decreased L grasp.  Transferred to wc with min A to brush teeth at sink with set up.  Taken to gym and sat on mat for 45 min with S while working on LUE shoulder AROM with reaching at various angles overhead, L hand/ finger strength with yellow clothespins.   RUE gentle PROM with towel slides on table focusing on pt's ability to tolerate movement. She did ok with slow, gentle movement.    Pt returned to room and requested to lay down.  In bed with alarm set and all needs met.   Therapy Documentation Precautions:  Precautions Precautions: Fall Precaution Comments: NPO Restrictions Weight Bearing Restrictions: No   Pain: c/o RUE only with PROM   ADL: ADL Eating: NPO Grooming: Dependent Where Assessed-Grooming: Bed level Upper Body Bathing: Maximal assistance Where  Assessed-Upper Body Bathing: Shower Lower Body Bathing: Maximal assistance Where Assessed-Lower Body Bathing: Shower Upper Body Dressing: Maximal assistance Where Assessed-Upper Body Dressing: Wheelchair Lower Body Dressing: Maximal assistance Where Assessed-Lower Body Dressing: Wheelchair, Standing at sink, Sitting at sink Toileting: Dependent Where Assessed-Toileting: Bed level Social research officer, government: Minimal assistance Social research officer, government Method: Stand pivot   Therapy/Group: Individual Therapy  [place] 02/05/2020, 8:31 AM

## 2020-02-05 NOTE — Progress Notes (Signed)
PHYSICAL MEDICINE & REHABILITATION PROGRESS NOTE   Subjective/Complaints: [name] says she slept better without NGT. Ate yesterday "but probably not enough". Right arm still sensitive  ROS: Patient denies fever, rash, sore throat, blurred vision, nausea, vomiting, diarrhea, cough, shortness of breath or chest pain,   headache, or mood change.    Objective:   DG Swallowing Func-Speech Pathology  Result Date: 02/04/2020 Objective Swallowing Evaluation: Type of Study: MBS-Modified Barium Swallow Study  Patient Details Name: Erika Stanton MRN: [id number] Date of Birth: 1956-04-24 Today's Date: 02/04/2020 Time: SLP Start Time (ACUTE ONLY): 0930 -SLP Stop Time (ACUTE ONLY): 1000 SLP Time Calculation (min) (ACUTE ONLY): 30 min Past Medical History: No past medical history on file. Past Surgical History: Past Surgical History: Procedure Laterality Date . CHOLECYSTECTOMY   . COLONOSCOPY  11/2017  at [facility]. no recurrent polyps.  suggest repeat surveillance study 11/2022.   Marland Kitchen COLONOSCOPY W/ POLYPECTOMY  06/2014  Dr Roxy Manns at [facility].  3 adenomatoous polyps, anal fissure.   . ESOPHAGOGASTRODUODENOSCOPY (EGD) WITH PROPOFOL N/A 01/27/2020  Procedure: ESOPHAGOGASTRODUODENOSCOPY (EGD) WITH PROPOFOL;  Surgeon: Mauri Pole, MD;  Location: [facility];  Service: Endoscopy;  Laterality: N/A; . IR 3D INDEPENDENT WKST  01/06/2020 . IR ANGIO INTRA EXTRACRAN SEL INTERNAL CAROTID BILAT MOD SED  01/06/2020 . IR ANGIO VERTEBRAL SEL VERTEBRAL UNI L MOD SED  01/06/2020 . IR ANGIOGRAM FOLLOW UP STUDY  01/06/2020 . IR ANGIOGRAM FOLLOW UP STUDY  01/06/2020 . IR ANGIOGRAM FOLLOW UP STUDY  01/06/2020 . IR ANGIOGRAM FOLLOW UP STUDY  01/06/2020 . IR ANGIOGRAM FOLLOW UP STUDY  01/06/2020 . IR ANGIOGRAM FOLLOW UP STUDY  01/06/2020 . IR ANGIOGRAM FOLLOW UP STUDY  01/06/2020 . IR ANGIOGRAM FOLLOW UP STUDY  01/06/2020 . IR ANGIOGRAM FOLLOW UP STUDY  01/06/2020 . IR ANGIOGRAM FOLLOW UP STUDY  01/06/2020 . IR NEURO EACH ADD'L AFTER BASIC UNI  RIGHT (MS)  01/06/2020 . IR TRANSCATH/EMBOLIZ  01/06/2020 . RADIOLOGY WITH ANESTHESIA N/A 01/06/2020  Procedure: IR WITH ANESTHESIA FOR ANEURYSM;  Surgeon: Consuella Lose, MD;  Location: [place];  Service: Radiology;  Laterality: N/A; . TUBAL LIGATION   HPI: See H&P  Subjective: sleepy Assessment / Plan / Recommendation CHL IP CLINICAL IMPRESSIONS 02/04/2020 Clinical Impression Although patient reports difficulty with swallowing and displays consistent overt s/s of aspiration/penetration at the bedside, patient's overall swallowing function appears slightly improved since last MBS.  Patient was lethargic throughout study and required encouragement for full participation resulting in patient declining attempting to feed herself with her left hand or take "bigger" bites/sips.  Patient continues to demonstrate impaired epiglottic inversion, anterior laryngeal mobility and pharyngeal constriction resulting in mild vallecular residue with both solids and liquids that was reduced with additional swallows.  Patient with 1 episode of penetration to the cords with thin liquids that was sensed and cleared and mild high intermittent penetration that was reduced with a chin tuck.  Suspect patient's overall swallowing function will continue to improve with removal of NG tube. Recommend patient initiate a diet of Dys. 1 textures with thin liquids with goal of upgrading solids quickly as cognitive functioning continues to improve in order to maximize PO intake and aid in removal of NG tube. SLP Visit Diagnosis -- Attention and concentration deficit following -- Frontal lobe and executive function deficit following -- Impact on safety and function --   CHL IP TREATMENT RECOMMENDATION 02/04/2020 Treatment Recommendations Therapy as outlined in treatment plan below   Prognosis 02/04/2020 Prognosis for Safe Diet Advancement  Good Barriers to Reach Goals Cognitive deficits Barriers/Prognosis Comment -- CHL IP DIET RECOMMENDATION 02/04/2020  SLP Diet Recommendations Dysphagia 1 (Puree) solids;Thin liquid Liquid Administration via Spoon;Straw Medication Administration Crushed with puree Compensations Minimize environmental distractions;Slow rate;Small sips/bites;Monitor for anterior loss;Multiple dry swallows after each bite/sip Postural Changes Seated upright at 90 degrees   CHL IP OTHER RECOMMENDATIONS 02/04/2020 Recommended Consults -- Oral Care Recommendations Oral care BID Other Recommendations Order thickener from pharmacy;Prohibited food (jello, ice cream, thin soups);Remove water pitcher;Have oral suction available   CHL IP FOLLOW UP RECOMMENDATIONS 02/04/2020 Follow up Recommendations Inpatient Rehab   CHL IP FREQUENCY AND DURATION 02/04/2020 Speech Therapy Frequency (ACUTE ONLY) min 3x week Treatment Duration 3 weeks      CHL IP ORAL PHASE 02/04/2020 Oral Phase Impaired Oral - Pudding Teaspoon -- Oral - Pudding Cup -- Oral - Honey Teaspoon NT Oral - Honey Cup -- Oral - Nectar Teaspoon Lingual/palatal residue Oral - Nectar Cup -- Oral - Nectar Straw Lingual/palatal residue Oral - Thin Teaspoon Lingual/palatal residue Oral - Thin Cup -- Oral - Thin Straw Lingual/palatal residue Oral - Puree WFL Oral - Mech Soft -- Oral - Regular -- Oral - Multi-Consistency -- Oral - Pill -- Oral Phase - Comment --  CHL IP PHARYNGEAL PHASE 02/04/2020 Pharyngeal Phase Impaired Pharyngeal- Pudding Teaspoon -- Pharyngeal -- Pharyngeal- Pudding Cup -- Pharyngeal -- Pharyngeal- Honey Teaspoon NT Pharyngeal -- Pharyngeal- Honey Cup -- Pharyngeal -- Pharyngeal- Nectar Teaspoon Reduced anterior laryngeal mobility;Reduced epiglottic inversion;Reduced pharyngeal peristalsis;Pharyngeal residue - valleculae Pharyngeal -- Pharyngeal- Nectar Cup -- Pharyngeal -- Pharyngeal- Nectar Straw Reduced epiglottic inversion;Reduced pharyngeal peristalsis;Reduced anterior laryngeal mobility;Pharyngeal residue - valleculae Pharyngeal -- Pharyngeal- Thin Teaspoon Reduced anterior laryngeal  mobility;Reduced epiglottic inversion;Pharyngeal residue - valleculae Pharyngeal -- Pharyngeal- Thin Cup -- Pharyngeal -- Pharyngeal- Thin Straw Reduced anterior laryngeal mobility;Reduced pharyngeal peristalsis;Reduced epiglottic inversion;Penetration/Aspiration during swallow Pharyngeal Material does not enter airway;Material enters airway, CONTACTS cords and then ejected out;Material enters airway, remains ABOVE vocal cords and not ejected out Pharyngeal- Puree Reduced anterior laryngeal mobility;Reduced epiglottic inversion;Reduced pharyngeal peristalsis;Pharyngeal residue - valleculae Pharyngeal -- Pharyngeal- Mechanical Soft -- Pharyngeal -- Pharyngeal- Regular -- Pharyngeal -- Pharyngeal- Multi-consistency -- Pharyngeal -- Pharyngeal- Pill -- Pharyngeal -- Pharyngeal Comment --  CHL IP CERVICAL ESOPHAGEAL PHASE 02/04/2020 Cervical Esophageal Phase WFL Pudding Teaspoon -- Pudding Cup -- Honey Teaspoon -- Honey Cup -- Nectar Teaspoon -- Nectar Cup -- Nectar Straw -- Thin Teaspoon -- Thin Cup -- Thin Straw -- Puree -- Mechanical Soft -- Regular -- Multi-consistency -- Pill -- Cervical Esophageal Comment -- PAYNE, COURTNEY 02/04/2020, 10:52 AM    Weston Anna, MA, CCC-SLP [phone]           No results for input(s): WBC, HGB, HCT, PLT in the last 72 hours. No results for input(s): NA, K, CL, CO2, GLUCOSE, BUN, CREATININE, CALCIUM in the last 72 hours.  Intake/Output Summary (Last 24 hours) at 02/05/2020 1039 Last data filed at 02/05/2020 0800 Gross per 24 hour  Intake 565 ml  Output 1950 ml  Net -1385 ml     Physical Exam: Vital Signs Blood pressure 130/82, pulse 93, temperature 98.2 F (36.8 C), resp. rate 18, height 5\' 6"  (1.676 m), weight 89.5 kg, SpO2 100 %.  Constitutional: No distress . Vital signs reviewed. HEENT: EOMI, oral membranes moist Neck: supple Cardiovascular: RRR without murmur. No JVD    Respiratory/Chest: CTA Bilaterally without wheezes or rales. Normal effort    GI/Abdomen:  BS +, non-tender, non-distended Ext: no clubbing, cyanosis, or  edema Psych: pleasant and cooperative Neurologic: very alert. Attends to me quickly. Improving insight and awareness. Speech/vocal quality improving.  Trace to 1/5 prox right UE, otherwise 0/5 RUE, RLE 0/5, LUE 2+/5 , LLE 4/5. Right arm remains sensitive to touch withdecreased LT discrimination Musculoskeletal: No pain with AROM/PROM BUE and BLE . No joint swelling  Skin: numerous bruises along limbs and abdomen which are resolving    Assessment/Plan: 1. 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  Physiatrist is providing close team supervision and 24 hour management of active medical problems listed below.  Physiatrist and rehab team continue to assess barriers to discharge/monitor patient progress toward functional and medical goals  Care Tool:  Bathing    Body parts bathed by patient: Front perineal area, Abdomen, Left upper leg, Right upper leg   Body parts bathed by helper: Buttocks, Right lower leg, Left lower leg, Face, Right arm, Left arm     Bathing assist Assist Level: Maximal Assistance - Patient 24 - 49%     Upper Body Dressing/Undressing Upper body dressing   What is the patient wearing?: Pull over shirt    Upper body assist Assist Level: Maximal Assistance - Patient 25 - 49%    Lower Body Dressing/Undressing Lower body dressing      What is the patient wearing?: Pants, Incontinence brief     Lower body assist Assist for lower body dressing: Maximal Assistance - Patient 25 - 49%     Toileting Toileting    Toileting assist Assist for toileting: Dependent - Patient 0%     Transfers Chair/bed transfer  Transfers assist     Chair/bed transfer assist level: Minimal Assistance - Patient > 75%     Locomotion Ambulation   Ambulation assist      Assist  level: Minimal Assistance - Patient > 75% Assistive device: No Device Max distance: 66ft   Walk 10 feet activity   Assist     Assist level: Minimal Assistance - Patient > 75% Assistive device: No Device   Walk 50 feet activity   Assist Walk 50 feet with 2 turns activity did not occur: Safety/medical concerns         Walk 150 feet activity   Assist Walk 150 feet activity did not occur: Safety/medical concerns         Walk 10 feet on uneven surface  activity   Assist Walk 10 feet on uneven surfaces activity did not occur: Safety/medical concerns         Wheelchair     Assist Will patient use wheelchair at discharge?: Yes Type of Wheelchair: Manual           Wheelchair 50 feet with 2 turns activity    Assist    Wheelchair 50 feet with 2 turns activity did not occur: Safety/medical concerns (requires TIS for postural support)       Wheelchair 150 feet activity     Assist  Wheelchair 150 feet activity did not occur: Safety/medical concerns       Blood pressure 130/82, pulse 93, temperature 98.2 F (36.8 C), resp. rate 18, height 5\' 6"  (1.676 m), weight 89.5 kg, SpO2 100 %.  Medical Problem List and Plan: 1.  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  Continue CIR, PT, OT, SLP     -making progressive  gains with mobility, speech, cognition and swallowing.   -I appreciate neurology assisting with information/education for husband  -I spoke with husband additionally as well.  2.  Antithrombotics: -DVT/anticoagulation:  Pharmaceutical: Heparin--hold                          Dopplers negative for DVT.              -antiplatelet therapy: DAPT--resumed plavix, asa     -hgb holding  3. Pain Management: Tylenol prn.    -added gabapentin 100mg  tid for neuropathic pain 9/23  -9/24,still tender,   observe for effect 4. Mood: LCSW to follow for evaluation and support when appropriate.              -antipsychotic agents: N/A 5. Neuropsych: This patient is not capable of making decisions on her own behalf.  Continue trial of ritalin  for concentration and arousal    -continue with 5mg  dose for now   -arousal and mentation gradually improving 6. Skin/Wound Care: routine pressure relief measures.  7. Fluids/Electrolytes/Nutrition:   -on D1/thins, ate 20-25% yesterday, 50% this morning  -await labs 9/24 8. ABLA/UGIB:     EGD 9/15 apparently demonstrated non-bleeding esophagitis/gastritis, HH   -husband doesn't want to pursue further testing  9/17: resumed plavix/asa. Will not resume sq heparin    -protonix per tube now (or IV if needed today)   -GI consult appreciated   -hgb holding at 11.7 again 9/20 after 2u PRBC   -today's labs pending still!  9. 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 Resting tachycardia:              has been well controlled.  12. Vasospasm prevention: To continue Nimotop for 3 additional days.  13. Bowel incontinence/loose stool: multifactorial, on abx for uti also  -  fiber/probiotic  -9/23  cholestyramine TID--dc'ed   14. Proteus UTI/enteroccus UTI: Ceftriaxone course completed for proteus  -amoxil x 5 days for enterococcus to complete 9/24 18.  Prediabetes, confounded by tube feeds  Relatively controlled.   Dc q4 hour checks 19.  Leukoctyosis: resolved  -rx uti      LOS: 16 days A FACE TO FACE EVALUATION WAS PERFORMED  Meredith Staggers 02/05/2020, 10:39 AM

## 2020-02-05 NOTE — Progress Notes (Signed)
Nutrition Follow-up  DOCUMENTATION CODES:   Not applicable  INTERVENTION:   - Ensure Enlive po TID, each supplement provides 350 kcal and 20 grams of protein  - Magic cup TID with meals, each supplement provides 290 kcal and 9 grams of protein  - Took pt's food preferences  NUTRITION DIAGNOSIS:   Inadequate oral intake related to dysphagia, lethargy/confusion as evidenced by meal completion < 50%.  Ongoing  GOAL:   Patient will meet greater than or equal to 90% of their needs  Progressing  MONITOR:   PO intake, Supplement acceptance, Diet advancement, Labs, Weight trends, TF tolerance, I & O's  REASON FOR ASSESSMENT:   Consult Enteral/tube feeding initiation and management  ASSESSMENT:   64 year old female who was admitted on 01/05/20 with right-sided headache and left hemiparesis. 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 Pt remains NPO due to signs of aspiration with tube feeds via Cortrak. Admitted to CIR on 9/08.  9/15 - s/p EGD with findings of non-bleeding reflux esophagitis, Cortrak removed 9/17 - MBS with recommendations for dysphagia 1 diet with nectar-thick liquids, Cortrak replaced, tip gastric per Cortrak team 9/22 - NPO 9/23 - MBS with recommendations for dysphagia 1 diet with thin liquids, Cortrak removed and TF d/c  Spoke with pt at bedside. Pt reports that she is trying to eat as much as she can. She reports that she did "okay" with breakfast this morning. Pt willing to drink Ensure Enlive oral nutrition supplements. RD to order. Pt also reports liking soup as well as orange Magic Cups. RD will  order these to come with meals.  Discussed with pt the importance of adequate PO intake and supplement acceptance. Provided encouragement to pt to push herself to eat just a little more at meals.  Meal Completion: 0-50%  Medications reviewed and include: SSI q 4 hours, ritalin, protonix, fibercon, KCl 40 mEq daily, florastor  Labs reviewed. CBG's: 90-138 x 24 hours  UOP: 1950 ml x 24 hours  Diet Order:   Diet Order            DIET - DYS 1 Room service appropriate? Yes; Fluid consistency: Thin  Diet effective now                 EDUCATION NEEDS:   No education needs have been identified at this time  Skin:  Skin Assessment: Reviewed RN Assessment  Last BM:  02/01/20  Height:   Ht Readings from Last 1 Encounters:  01/20/20 5\' 6"  (1.676 m)    Weight:   Wt Readings from Last 1 Encounters:  01/20/20 89.5 kg    Ideal Body Weight:  59.1 kg  BMI:  Body mass index is 31.85 kg/m.  Estimated Nutritional Needs:   Kcal:  1900-2100  Protein:  95-115 grams  Fluid:  >/= 1.9 L    Gaynell Face, MS, RD, LDN Inpatient Clinical Dietitian Please see AMiON for contact information.

## 2020-02-06 ENCOUNTER — Inpatient Hospital Stay (HOSPITAL_COMMUNITY): Payer: BC Managed Care – PPO

## 2020-02-06 LAB — GLUCOSE, CAPILLARY
Glucose-Capillary: 102 mg/dL — ABNORMAL HIGH (ref 70–99)
Glucose-Capillary: 192 mg/dL — ABNORMAL HIGH (ref 70–99)
Glucose-Capillary: 99 mg/dL (ref 70–99)
Glucose-Capillary: 107 mg/dL — ABNORMAL HIGH (ref 70–99)
Glucose-Capillary: 105 mg/dL — ABNORMAL HIGH (ref 70–99)

## 2020-02-06 NOTE — Progress Notes (Signed)
Physical Therapy Session Note  Patient Details  Name: Erika Stanton MRN: [id number] Date of Birth: April 22, 1956  Today's Date: 02/06/2020 PT Individual Time: 0900-1000 PT Individual Time Calculation (min): 60 min   Short Term Goals: Week 1:  PT Short Term Goal 1 (Week 1): Pt will demonstrate static sit consistently w/min assist PT Short Term Goal 1 - Progress (Week 1): Not met PT Short Term Goal 2 (Week 1): pt will perform STS w/mod assist of 2 PT Short Term Goal 2 - Progress (Week 1): Not met PT Short Term Goal 3 (Week 1): Pt will ambulate 41f w/LRAD and max assist of 2 PT Short Term Goal 3 - Progress (Week 1): Not met PT Short Term Goal 4 (Week 1): Pt will tolerate passive cervical rotation to R 20* PT Short Term Goal 4 - Progress (Week 1): Not met PT Short Term Goal 5 (Week 1): Pt will tolerate 1-2 hrs OOB in wc PT Short Term Goal 5 - Progress (Week 1): Met Week 2:  PT Short Term Goal 1 (Week 2): Pt will complete bed<>w/c with max assist +1 consistently. PT Short Term Goal 1 - Progress (Week 2): Met PT Short Term Goal 2 (Week 2): Pt will demonstrate static sitting balance x 5 minutes with min assist. PT Short Term Goal 2 - Progress (Week 2): Met PT Short Term Goal 3 (Week 2): Pt will consistently complete bed mobility with max assist +1. PT Short Term Goal 3 - Progress (Week 2): Met Week 3:  PT Short Term Goal 1 (Week 3): Pt will perform supine<>sit with CGA PT Short Term Goal 2 (Week 3): Pt will perform sit<>stands using LRAD with CGA PT Short Term Goal 3 (Week 3): Pt will perform bed<>chair transfers using LRAD with CGA PT Short Term Goal 4 (Week 3): Pt will ambulate at least 257fusing LRAD with min assist PT Short Term Goal 5 (Week 3): Pt will ascend/descend 4 steps using HRs with mod assist  Skilled Therapeutic Interventions/Progress Updates:    PAIN pt denies pain this am  Pt initially supine and agreeable to session.  Supine to sit w/cga.  Dons shoes in sitting w/min  assist and set up. STS from bed w/min assist, gait x 1051furn/sit to wc w/min to light mod assist due to L lean.  Pt transported to gym for continued session. Spt to mat w/cga Pt fitted w/GivMor sling to RUE w/improved seating of humeral head achieved.  Sling used for full session.   Gait: 49f64fmin to occasional mod assist due to L lean tendency, turn/sit to chair where she required 5 min rest before repeating gait.  L lean increased w/second gait trial.  Balance: Static stand w/mild L lean and increased L knee flexion, pt unable to extend and attributes to preexisting OA in knee. Static stand eyes open/closed w/cga Static stand w/cervical nods - pt co dizzyness                         Cervical rotation cga Static stand w/tapping L foot progressing to marching w/L to promote wt shift to R, min assist to cga. Standing floor clock using cones/tapping cones w/L foot to promote wt shift to R, repeated x 4, cga to min assist.  Discussed purpose of wt shifting activities and relation to improved gait function.  Pt receptive.  Gait 45ft10fin assist as above, turn/sit to wc w/min assist.   Pt transported back to room at end  of session.  SPT to bed w/min assist.  Sit to supine w/cues and min assist.  Scoots w/max cues and mod assist. Pt left supine w/rails up x 3, alarm set, bed in lowest position, and needs in reach.   Therapy Documentation Precautions:  Precautions Precautions: Fall Precaution Comments: NPO Restrictions Weight Bearing Restrictions: No    Therapy/Group: Individual Therapy  Callie Fielding, [place] 02/06/2020, 4:33 PM

## 2020-02-06 NOTE — Progress Notes (Signed)
[place] PHYSICAL MEDICINE & REHABILITATION PROGRESS NOTE   Subjective/Complaints: Slept comfortably. No new problems reported. Met husband briefly as well.   ROS: Patient denies fever, rash, sore throat, blurred vision, nausea, vomiting, diarrhea, cough, shortness of breath or chest pain, joint or back pain, headache, or mood change.    Objective:   No results found. Recent Labs    02/05/20 1151  WBC 3.9*  HGB 14.8  HCT 45.6  PLT 132*   Recent Labs    02/05/20 1151  NA 138  K 4.3  CL 103  CO2 25  GLUCOSE 88  BUN 13  CREATININE 0.56  CALCIUM 9.6    Intake/Output Summary (Last 24 hours) at 02/06/2020 1156 Last data filed at 02/06/2020 0807 Gross per 24 hour  Intake 700 ml  Output 1750 ml  Net -1050 ml     Physical Exam: Vital Signs Blood pressure 106/74, pulse 81, temperature 98 F (36.7 C), resp. rate 16, height 5' 6"  (1.676 m), weight 89.5 kg, SpO2 99 %.  Constitutional: No distress . Vital signs reviewed. HEENT: EOMI, oral membranes moist Neck: supple Cardiovascular: RRR without murmur. No JVD    Respiratory/Chest: CTA Bilaterally without wheezes or rales. Normal effort    GI/Abdomen: BS +, non-tender, non-distended Ext: no clubbing, cyanosis, or edema Psych: cooperative Neurologic:alert, attentive.  Improving insight and awareness. Speech/vocal quality improving.  Trace to 1/5 prox right UE, otherwise 0/5 RUE, RLE 0/5, LUE 2+/5 , LLE 4/5. Right arm remains sensitive to touch withdecreased LT discrimination Musculoskeletal: No pain with AROM/PROM BUE and BLE . No joint swelling  Skin: numerous bruises along limbs and abdomen which are resolving    Assessment/Plan: 1. 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  Physiatrist is providing close team supervision and 24 hour management of active medical problems listed  below.  Physiatrist and rehab team continue to assess barriers to discharge/monitor patient progress toward functional and medical goals  Care Tool:  Bathing    Body parts bathed by patient: Front perineal area, Abdomen, Left upper leg, Right upper leg   Body parts bathed by helper: Buttocks, Right lower leg, Left lower leg, Face, Right arm, Left arm     Bathing assist Assist Level: Maximal Assistance - Patient 24 - 49%     Upper Body Dressing/Undressing Upper body dressing   What is the patient wearing?: Pull over shirt    Upper body assist Assist Level: Maximal Assistance - Patient 25 - 49%    Lower Body Dressing/Undressing Lower body dressing      What is the patient wearing?: Pants, Incontinence brief     Lower body assist Assist for lower body dressing: Maximal Assistance - Patient 25 - 49%     Toileting Toileting    Toileting assist Assist for toileting: Dependent - Patient 0%     Transfers Chair/bed transfer  Transfers assist     Chair/bed transfer assist level: Minimal Assistance - Patient > 75%     Locomotion Ambulation   Ambulation assist      Assist level: Minimal Assistance - Patient > 75% Assistive device: No Device Max distance: 82f   Walk 10 feet activity   Assist     Assist level: Minimal Assistance - Patient > 75% Assistive device: No Device   Walk 50 feet activity   Assist Walk 50 feet with 2 turns activity did not occur: Safety/medical  concerns         Walk 150 feet activity   Assist Walk 150 feet activity did not occur: Safety/medical concerns         Walk 10 feet on uneven surface  activity   Assist Walk 10 feet on uneven surfaces activity did not occur: Safety/medical concerns         Wheelchair     Assist Will patient use wheelchair at discharge?: Yes Type of Wheelchair: Manual           Wheelchair 50 feet with 2 turns activity    Assist    Wheelchair 50 feet with 2 turns  activity did not occur: Safety/medical concerns (requires TIS for postural support)       Wheelchair 150 feet activity     Assist  Wheelchair 150 feet activity did not occur: Safety/medical concerns       Blood pressure 106/74, pulse 81, temperature 98 F (36.7 C), resp. rate 16, height 5' 6"  (1.676 m), weight 89.5 kg, SpO2 99 %.  Medical Problem List and Plan: 1.  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  Continue CIR, PT, OT, SLP     -making progressive gains with mobility, speech, cognition and swallowing.   -I appreciate neurology assisting with information/education for husband  -I spoke with husband additionally as well.  2.  Antithrombotics: -DVT/anticoagulation:  Pharmaceutical: Heparin--hold                          Dopplers negative for DVT.              -antiplatelet therapy: DAPT--resumed plavix, asa      3. Pain Management: Tylenol prn.    -added gabapentin 187m tid for neuropathic pain 9/23  -9/24,still tender,  observe for effect 4. Mood: LCSW to follow for evaluation and support when appropriate.              -antipsychotic agents: N/A 5. Neuropsych: This patient is not capable of making decisions on her own behalf.  Continue trial of ritalin  for concentration and arousal    -continue with 564mdose for now   -arousal and mentation gradually improving 6. Skin/Wound Care: routine pressure relief measures.  7. Fluids/Electrolytes/Nutrition:   -on D1/thins,labs ok yesterday  -encourage PO  -recheck labs, prealbumin Monday    8. ABLA/UGIB:     EGD 9/15 apparently demonstrated non-bleeding esophagitis/gastritis, HH   -husband doesn't want to pursue further testing  9/17: resumed plavix/asa. Will not resume sq heparin    -protonix per tube now (or IV if needed today)   -GI consult appreciated   -hgb holding at  11.7 again 9/20 after 2u PRBC   -hgb miraculously up to 14.8 today!  9. 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 Resting tachycardia:              has been well controlled.  12. Vasospasm prevention: To continue Nimotop for 3 additional days.  13. Bowel incontinence/loose stool: multifactorial, on abx for uti also  -  fiber/probiotic  -9/23  cholestyramine TID--dc'ed   14. Proteus UTI/enteroccus UTI: Ceftriaxone course completed for proteus  -amoxil x 5 days for enterococcus  complete 9/24 18.  Prediabetes,    Relatively controlled.  19.  Leukoctyosis: resolved        LOS: 17 days A FACE TO FACE EVALUATION WAS PERFORMED  Meredith Staggers 02/06/2020, 11:56 AM

## 2020-02-06 NOTE — Progress Notes (Addendum)
I/O cath for 400 cc urine , tolerated well, no complaint

## 2020-02-06 NOTE — Plan of Care (Signed)
  Problem: RH BOWEL ELIMINATION Goal: RH STG MANAGE BOWEL WITH ASSISTANCE Description: STG Manage Bowel with min Assistance. Outcome: Not Progressing; LBM 9/21; laxatives   Problem: RH BLADDER ELIMINATION Goal: RH STG MANAGE BLADDER WITH ASSISTANCE Description: STG Manage Bladder With min Assistance Outcome: Not Progressing; in and out cath q 6

## 2020-02-06 NOTE — Progress Notes (Signed)
Occupational Therapy Session Note  Patient Details  Name: Erika Stanton MRN: [id number] Date of Birth: 1956/02/21  Today's Date: 02/06/2020 OT Individual Time: 1103-1130 OT Individual Time Calculation (min): 27 min    Short Term Goals: Week 1:  OT Short Term Goal 1 (Week 1): patient will roll in bed with min A, move from side lying to sitting edge of bed with mod A of one OT Short Term Goal 1 - Progress (Week 1): Met OT Short Term Goal 2 (Week 1): patient will tolerate unsupported sitting with min A for 10 minutes with head midline OT Short Term Goal 2 - Progress (Week 1): Met OT Short Term Goal 3 (Week 1): patient will use left UE to wash 25% of upper body and assist with donning [place] shirt max A OT Short Term Goal 3 - Progress (Week 1): Progressing toward goal OT Short Term Goal 4 (Week 1): patient will scan environment both right and left to locate items with moderate cues OT Short Term Goal 4 - Progress (Week 1): Met  Skilled Therapeutic Interventions/Progress Updates:    1:1 Pt recievd in bed agreeable to OT working on RUE Pt reporting pain at shoulder FF at 90* therefore stayed below that range and pai from midline IV Pt completes supine therex for RUE: shoulder flex/ext, internal/ext (min facilitation) rotion, wrist flex/ext, an gross grasp/release with min manual resistance to strengthen in max pain free ranges. PROM provided against flexor synergy pattern and digit extension. Exited session with pt seated in bed, exit alarm on and call light in reach  Therapy Documentation Precautions:  Precautions Precautions: Fall Precaution Comments: NPO Restrictions Weight Bearing Restrictions: No General:   Vital Signs:   Pain:   ADL: ADL Eating: NPO Grooming: Dependent Where Assessed-Grooming: Bed level Upper Body Bathing: Maximal assistance Where Assessed-Upper Body Bathing: Shower Lower Body Bathing: Maximal assistance Where Assessed-Lower Body Bathing: Shower Upper Body  Dressing: Maximal assistance Where Assessed-Upper Body Dressing: Wheelchair Lower Body Dressing: Maximal assistance Where Assessed-Lower Body Dressing: Wheelchair, Standing at sink, Sitting at sink Toileting: Dependent Where Assessed-Toileting: Bed level Social research officer, government: Minimal assistance Social research officer, government Method: Stand pivot ADL Comments: patient able to hold wash cloth with left hand but unable to manipulate for washing, she is able to lift each leg for donning pants/socks Vision   Perception    Praxis   Exercises:   Other Treatments:     Therapy/Group: Individual Therapy  Tonny Branch 02/06/2020, 11:13 AM

## 2020-02-07 LAB — GLUCOSE, CAPILLARY
Glucose-Capillary: 104 mg/dL — ABNORMAL HIGH (ref 70–99)
Glucose-Capillary: 96 mg/dL (ref 70–99)
Glucose-Capillary: 89 mg/dL (ref 70–99)
Glucose-Capillary: 99 mg/dL (ref 70–99)

## 2020-02-07 NOTE — Plan of Care (Signed)
  Problem: RH BLADDER ELIMINATION Goal: RH STG MANAGE BLADDER WITH ASSISTANCE Description: STG Manage Bladder With min Assistance Outcome: Not Progressing; in and out cath    

## 2020-02-07 NOTE — Progress Notes (Signed)
[place] PHYSICAL MEDICINE & REHABILITATION PROGRESS NOTE   Subjective/Complaints: Slept well again. Trying to eat more. Left arm pain better but still tender  ROS: Patient denies fever, rash, sore throat, blurred vision, nausea, vomiting, diarrhea, cough, shortness of breath or chest pain,  headache, or mood change.     Objective:   No results found. Recent Labs    02/05/20 1151  WBC 3.9*  HGB 14.8  HCT 45.6  PLT 132*   Recent Labs    02/05/20 1151  NA 138  K 4.3  CL 103  CO2 25  GLUCOSE 88  BUN 13  CREATININE 0.56  CALCIUM 9.6    Intake/Output Summary (Last 24 hours) at 02/07/2020 1131 Last data filed at 02/07/2020 1100 Gross per 24 hour  Intake 990 ml  Output 1400 ml  Net -410 ml     Physical Exam: Vital Signs Blood pressure 113/77, pulse 91, temperature 97.9 F (36.6 C), resp. rate 18, height 5\' 6"  (1.676 m), weight 89.5 kg, SpO2 100 %.  Constitutional: No distress . Vital signs reviewed. HEENT: EOMI, oral membranes moist Neck: supple Cardiovascular: RRR without murmur. No JVD    Respiratory/Chest: CTA Bilaterally without wheezes or rales. Normal effort    GI/Abdomen: BS +, non-tender, non-distended Ext: no clubbing, cyanosis, or edema Psych:   cooperative Neurologic:alert, attentive.  Improving insight and awareness. Speech/vocal quality much better.  Trace to 1/5 prox right UE, otherwise 0/5 RUE, RLE 0/5, LUE 2+/5 , LLE 4/5. Right arm remains sensitive to touch withdecreased LT discrimination Musculoskeletal: Right arm still tender with ROM . No joint swelling  Skin: numerous bruises along limbs and abdomen which are resolving    Assessment/Plan: 1. 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  Physiatrist is providing close team supervision and 24 hour management of active medical problems listed  below.  Physiatrist and rehab team continue to assess barriers to discharge/monitor patient progress toward functional and medical goals  Care Tool:  Bathing    Body parts bathed by patient: Front perineal area, Abdomen, Left upper leg, Right upper leg   Body parts bathed by helper: Buttocks, Right lower leg, Left lower leg, Face, Right arm, Left arm     Bathing assist Assist Level: Maximal Assistance - Patient 24 - 49%     Upper Body Dressing/Undressing Upper body dressing   What is the patient wearing?: Pull over shirt    Upper body assist Assist Level: Maximal Assistance - Patient 25 - 49%    Lower Body Dressing/Undressing Lower body dressing      What is the patient wearing?: Pants, Incontinence brief     Lower body assist Assist for lower body dressing: Maximal Assistance - Patient 25 - 49%     Toileting Toileting    Toileting assist Assist for toileting: Dependent - Patient 0%     Transfers Chair/bed transfer  Transfers assist     Chair/bed transfer assist level: Minimal Assistance - Patient > 75%     Locomotion Ambulation   Ambulation assist      Assist level: Minimal Assistance - Patient > 75% Assistive device: No Device Max distance: 66ft   Walk 10 feet activity   Assist     Assist level: Minimal Assistance - Patient > 75% Assistive device: No Device   Walk 50 feet activity   Assist Walk 50 feet with 2 turns activity did not  occur: Safety/medical concerns         Walk 150 feet activity   Assist Walk 150 feet activity did not occur: Safety/medical concerns         Walk 10 feet on uneven surface  activity   Assist Walk 10 feet on uneven surfaces activity did not occur: Safety/medical concerns         Wheelchair     Assist Will patient use wheelchair at discharge?: Yes Type of Wheelchair: Manual           Wheelchair 50 feet with 2 turns activity    Assist    Wheelchair 50 feet with 2 turns  activity did not occur: Safety/medical concerns (requires TIS for postural support)       Wheelchair 150 feet activity     Assist  Wheelchair 150 feet activity did not occur: Safety/medical concerns       Blood pressure 113/77, pulse 91, temperature 97.9 F (36.6 C), resp. rate 18, height 5\' 6"  (1.676 m), weight 89.5 kg, SpO2 100 %.  Medical Problem List and Plan: 1.  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  Continue CIR, PT, OT, SLP     -making progressive gains with mobility, speech, cognition and swallowing.   -I'm speaking with husband regularly about her status. He needs paperwork filled out this week which I said we would take care of.  2.  Antithrombotics: -DVT/anticoagulation:  Pharmaceutical: Heparin--hold                          Dopplers negative for DVT.              -antiplatelet therapy: DAPT--resumed plavix, asa      3. Pain Management: Tylenol prn.    -added gabapentin 100mg  tid for neuropathic pain 9/23  -9/24,still tender,  observe for effect 4. Mood: LCSW to follow for evaluation and support when appropriate.              -antipsychotic agents: N/A 5. Neuropsych: This patient is not capable of making decisions on her own behalf.  Continue trial of ritalin  for concentration and arousal    -continue with 5mg  dose for now   -arousal and mentation gradually improving 6. Skin/Wound Care: routine pressure relief measures.  7. Fluids/Electrolytes/Nutrition:   -on D1/thins,labs ok yesterday  -encouraging PO. She's trying!  -f/u labs, prealbumin Monday    8. ABLA/UGIB:     EGD 9/15 apparently demonstrated non-bleeding esophagitis/gastritis, HH   -husband doesn't want to pursue further testing  9/17: resumed plavix/asa. Will not resume sq heparin    -protonix per tube now (or IV if needed today)   -GI consult  appreciated   -hgb holding at 11.7 again 9/20 after 2u PRBC   -hgb miraculously up to 14.8 on 9/24!  9. 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 Resting tachycardia:              has been well controlled.  12. Vasospasm prevention: To continue Nimotop for 3 additional days.  13. Bowel incontinence/loose stool: multifactorial, on abx for uti also  -  fiber/probiotic  -9/23  cholestyramine TID--dc'ed  -had large soft stool this morning 9/26 14. Proteus UTI/enteroccus UTI: Ceftriaxone course completed for proteus  -amoxil  x 5 days for enterococcus  complete 9/24 18.  Prediabetes,    Relatively controlled.  19.  Leukoctyosis: resolved        LOS: 18 days A FACE TO [place] 02/07/2020, 11:31 AM

## 2020-02-08 ENCOUNTER — Inpatient Hospital Stay (HOSPITAL_COMMUNITY): Payer: BC Managed Care – PPO | Admitting: Speech Pathology

## 2020-02-08 ENCOUNTER — Inpatient Hospital Stay (HOSPITAL_COMMUNITY): Payer: BC Managed Care – PPO | Admitting: Occupational Therapy

## 2020-02-08 ENCOUNTER — Inpatient Hospital Stay (HOSPITAL_COMMUNITY): Payer: BC Managed Care – PPO | Admitting: Physical Therapy

## 2020-02-08 LAB — BASIC METABOLIC PANEL
Anion gap: 12 (ref 5–15)
BUN: 9 mg/dL (ref 8–23)
CO2: 24 mmol/L (ref 22–32)
Calcium: 9.1 mg/dL (ref 8.9–10.3)
Chloride: 102 mmol/L (ref 98–111)
Creatinine, Ser: 0.49 mg/dL (ref 0.44–1.00)
GFR calc Af Amer: >60 mL/min (ref >60)
GFR calc non Af Amer: >60 mL/min (ref >60)
Glucose, Bld: 104 mg/dL — ABNORMAL HIGH (ref 70–99)
Potassium: 3.9 mmol/L (ref 3.5–5.1)
Sodium: 138 mmol/L (ref 135–145)

## 2020-02-08 LAB — CBC
HCT: 37.7 % (ref 36.0–46.0)
Hemoglobin: 11.7 g/dL — ABNORMAL LOW (ref 12.0–15.0)
MCH: 30.5 pg (ref 26.0–34.0)
MCHC: 31 g/dL (ref 30.0–36.0)
MCV: 98.2 fL (ref 80.0–100.0)
Platelets: 311 10*3/uL (ref 150–400)
RBC: 3.84 MIL/uL — ABNORMAL LOW (ref 3.87–5.11)
RDW: 13.1 % (ref 11.5–15.5)
WBC: 5.8 10*3/uL (ref 4.0–10.5)
nRBC: 0 % (ref 0.0–0.2)

## 2020-02-08 LAB — PREALBUMIN: Prealbumin: 24.5 mg/dL (ref 18–38)

## 2020-02-08 LAB — GLUCOSE, CAPILLARY: Glucose-Capillary: 112 mg/dL — ABNORMAL HIGH (ref 70–99)

## 2020-02-08 NOTE — Progress Notes (Signed)
Physical Therapy Session Note  Patient Details  Name: Erika Stanton MRN: [id number] Date of Birth: [birth year]  Today's Date: 02/08/2020 PT Individual Time: 0935-1017 PT Individual Time Calculation (min): 42 min   Short Term Goals: Week 3:  PT Short Term Goal 1 (Week 3): Pt will perform supine<>sit with CGA PT Short Term Goal 2 (Week 3): Pt will perform sit<>stands using LRAD with CGA PT Short Term Goal 3 (Week 3): Pt will perform bed<>chair transfers using LRAD with CGA PT Short Term Goal 4 (Week 3): Pt will ambulate at least 7ft using LRAD with min assist PT Short Term Goal 5 (Week 3): Pt will ascend/descend 4 steps using HRs with mod assist  Skilled Therapeutic Interventions/Progress Updates:  Pt received in recliner & agreeable to tx. Sit<>stand throughout session with min assist with pt aware of need to scoot out to edge of seat. Gait ~3 ft to w/c without AD & light mod assist. Retrieved R half lap tray for RUE support while pt sits in w/c with pt reporting increased comfort. Gait x 40 ft + 40 ft without AD & light mod assist with pt demonstrating decreased step width LLE, decreased weight shift L, L lateral lean, and decreased gait speed with seated rest break in between. Encouraged pt to increase food consumption (SLP reports pt isn't eating much) with pt reporting she is brought food she doesn't eat. Assisted pt with looking at menu/selecting items she likes & calling to place breakfast & lunch order for tomorrow. While doing so, PT performed RUE elbow & digit ROM with pt reporting pain in R shoulder & repositioning provided for increased comfort. At end of session pt left in recliner with chair alarm donned, call bell in reach.  Therapy Documentation Precautions:  Precautions Precautions: Fall Precaution Comments: NPO Restrictions Weight Bearing Restrictions: No    Therapy/Group: Individual Therapy  [name] 02/08/2020, 12:26 PM

## 2020-02-08 NOTE — Progress Notes (Signed)
Speech Language Pathology Daily Session Note  Patient Details  Name: Erika Stanton MRN: [id number] Date of Birth: 12/08/1955  Today's Date: 02/08/2020 SLP Individual Time: 0825-0925 SLP Individual Time Calculation (min): 60 min  Short Term Goals: Week 3: SLP Short Term Goal 1 (Week 3): Patient will consume current diet with minimal overt s/s of aspiration and overall Min A verbal cues for use of swallowing compensatory strategies. SLP Short Term Goal 2 (Week 3): Patient will demonstrate efficient mastication with trials of Dys. 2 textures without overt s/s of aspiration with Min A verbal cues over 2 sessions prior to upgrade. SLP Short Term Goal 3 (Week 3): Patient will demonstrate sustained attention to functional tasks for 10 minutes with Mod verbal cues for redirection. SLP Short Term Goal 4 (Week 3): Patient will demonstrate functional problem solving for basic and familiar tasks with Mod A verbal cues. SLP Short Term Goal 5 (Week 3): Patient will recall new, daily information with Mod A verbal cues for use of external aids.  Skilled Therapeutic Interventions: Skilled treatment session focused on dysphagia and cognitive goals. SLP facilitated session by providing skilled observation with breakfast meal of Dys. 1 textures with thin liquids. Patient consumed meal with minimal overt s/s of aspiration and required supervision verbal cues for use of swallowing compensatory strategies. Patient consumed trials of Dys. 2 textures with efficient mastication and complete oral clearance with use of an intermittent liquid wash. No overt s/s of aspiration noted. Recommend trial tray prior to upgrade. Patient demonstrated increased sustained attention to tasks and demonstrated increased PO intake of meals. Patient also recalled events from the weekend with overall Min A verbal cues. Patient left upright in recliner with alarm on and all needs within reach. Continue with current plan of care.       Pain No/Denies Pain   Therapy/Group: Individual Therapy  Bence Trapp 02/08/2020, 12:32 PM

## 2020-02-08 NOTE — Progress Notes (Signed)
[facility] PHYSICAL MEDICINE & REHABILITATION PROGRESS NOTE   Subjective/Complaints: No complaints this morning. Moving bowels regularly.  Not using IV CBGs well controlled and she does not like finger sticks  ROS: Patient denies fever, rash, sore throat, blurred vision, nausea, vomiting, diarrhea, cough, shortness of breath or chest pain,  headache, or mood change.     Objective:   No results found. Recent Labs    02/08/20 0652  WBC 5.8  HGB 11.7*  HCT 37.7  PLT 311   Recent Labs    02/08/20 0652  NA 138  K 3.9  CL 102  CO2 24  GLUCOSE 104*  BUN 9  CREATININE 0.49  CALCIUM 9.1    Intake/Output Summary (Last 24 hours) at 02/08/2020 1256 Last data filed at 02/08/2020 0600 Gross per 24 hour  Intake 698 ml  Output 2150 ml  Net -1452 ml     Physical Exam: Vital Signs Blood pressure 101/67, pulse 90, temperature 97.8 F (36.6 C), resp. rate 20, height 5\' 6"  (1.676 m), weight 89.5 kg, SpO2 100 %.  General: Alert and oriented x 3, No apparent distress HEENT: Head is normocephalic, atraumatic, PERRLA, EOMI, sclera anicteric, oral mucosa pink and moist, dentition intact, ext ear canals clear,  Neck: Supple without JVD or lymphadenopathy Heart: Reg rate and rhythm. No murmurs rubs or gallops Chest: CTA bilaterally without wheezes, rales, or rhonchi; no distress Abdomen: Soft, non-tender, non-distended, bowel sounds positive. Extremities: No clubbing, cyanosis, or edema. Pulses are 2+ Psych:   cooperative Neurologic:alert, attentive.  Improving insight and awareness. Speech/vocal quality much better.  Trace to 1/5 prox right UE, otherwise 0/5 RUE, RLE 0/5, LUE 2+/5 , LLE 4/5. Right arm remains sensitive to touch withdecreased LT discrimination Musculoskeletal: Right arm still tender with ROM . No joint swelling  Skin: numerous bruises along limbs and abdomen which are resolving    Assessment/Plan: 1. Functional deficits secondary to bilateral ICH's and associated  infarcts which require 3+ hours per day of interdisciplinary therapy in a comprehensive inpatient rehab setting.  Physiatrist is providing close team supervision and 24 hour management of active medical problems listed below.  Physiatrist and rehab team continue to assess barriers to discharge/monitor patient progress toward functional and medical goals  Care Tool:  Bathing    Body parts bathed by patient: Front perineal area, Abdomen, Left upper leg, Right upper leg   Body parts bathed by helper: Buttocks, Right lower leg, Left lower leg, Face, Right arm, Left arm     Bathing assist Assist Level: Maximal Assistance - Patient 24 - 49%     Upper Body Dressing/Undressing Upper body dressing   What is the patient wearing?: Pull over shirt    Upper body assist Assist Level: Maximal Assistance - Patient 25 - 49%    Lower Body Dressing/Undressing Lower body dressing      What is the patient wearing?: Pants, Incontinence brief     Lower body assist Assist for lower body dressing: Maximal Assistance - Patient 25 - 49%     Toileting Toileting    Toileting assist Assist for toileting: Dependent - Patient 0%     Transfers Chair/bed transfer  Transfers assist     Chair/bed transfer assist level: Minimal Assistance - Patient > 75%     Locomotion Ambulation   Ambulation assist      Assist level: Moderate Assistance - Patient 50 - 74% Assistive device: No Device Max distance: 40 ft   Walk 10 feet activity  Assist     Assist level: Moderate Assistance - Patient - 50 - 74% Assistive device: No Device   Walk 50 feet activity   Assist Walk 50 feet with 2 turns activity did not occur: Safety/medical concerns         Walk 150 feet activity   Assist Walk 150 feet activity did not occur: Safety/medical concerns         Walk 10 feet on uneven surface  activity   Assist Walk 10 feet on uneven surfaces activity did not occur: Safety/medical  concerns         Wheelchair     Assist Will patient use wheelchair at discharge?: Yes Type of Wheelchair: Manual           Wheelchair 50 feet with 2 turns activity    Assist    Wheelchair 50 feet with 2 turns activity did not occur: Safety/medical concerns (requires TIS for postural support)       Wheelchair 150 feet activity     Assist  Wheelchair 150 feet activity did not occur: Safety/medical concerns       Blood pressure 101/67, pulse 90, temperature 97.8 F (36.6 C), resp. rate 20, height 5\' 6"  (1.676 m), weight 89.5 kg, SpO2 100 %.  Medical Problem List and Plan: 1.  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  Continue CIR, PT, OT, SLP     -making progressive gains with mobility, speech, cognition and swallowing.   -I'm speaking with husband regularly about her status. He needs paperwork filled out this week which I said we would take care of.  2.  Antithrombotics: -DVT/anticoagulation:  Pharmaceutical: Heparin--hold                          Dopplers negative for DVT.              -antiplatelet therapy: DAPT--resumed plavix, asa   3. Pain Management: Tylenol prn.    -added gabapentin 100mg  tid for neuropathic pain 9/23  -9/24,still tender,  observe for effect  -9/27: she is not sure how much Gabapentin is helping. It is not making her drowsy. Would not like to increase dose at this time.  4. Mood: LCSW to follow for evaluation and support when appropriate.              -antipsychotic agents: N/A 5. Neuropsych: This patient is not capable of making decisions on her own behalf.  Continue trial of ritalin  for concentration and arousal    -continue with 5mg  dose for now   -arousal and mentation gradually improving. Appears brighter.  6. Skin/Wound Care: routine pressure relief measures. May d/c IV 7.  Fluids/Electrolytes/Nutrition:   -on D1/thins,labs ok yesterday  -encouraging PO. She's trying!  Labs look great 9/27   8. ABLA/UGIB:     EGD 9/15 apparently demonstrated non-bleeding esophagitis/gastritis, HH   -husband doesn't want to pursue further testing  9/17: resumed plavix/asa. Will not resume sq heparin    -protonix per tube now (or IV if needed today)   -GI consult appreciated   -hgb holding at 11.7 again 9/20 after 2u PRBC   -hgb miraculously up to 14.8 on 9/24!  9. 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 Resting tachycardia:  has been well controlled.  12. Vasospasm prevention: To continue Nimotop for 3 additional days.  13. Bowel incontinence/loose stool: multifactorial, on abx for uti also  -  fiber/probiotic  -9/23  cholestyramine TID--dc'ed  -had large soft stool this morning 9/26 14. Proteus UTI/enteroccus UTI: Ceftriaxone course completed for proteus  -amoxil x 5 days for enterococcus  complete 9/24 18.  Prediabetes,    Relatively controlled. May d/c CBGs  19.  Leukoctyosis: resolved        LOS: 19 days A FACE TO FACE EVALUATION WAS PERFORMED  Clide Deutscher Rayhana Slider 02/08/2020, 12:56 PM

## 2020-02-08 NOTE — Progress Notes (Signed)
Occupational Therapy Session Note  Patient Details  Name: Erika Stanton MRN: [id number] Date of Birth: 07-29-1955  Today's Date: 02/08/2020 OT Individual Time: 1100-1200   &   1510-1549 OT Individual Time Calculation (min): 60 min    &   39 min   Short Term Goals: Week 2:  OT Short Term Goal 1 (Week 2): Pt will use L hand with mod A to wash 50 % of her UB. OT Short Term Goal 1 - Progress (Week 2): Met OT Short Term Goal 2 (Week 2): Pt will be able to sit upright on  a BSC with min A. OT Short Term Goal 2 - Progress (Week 2): Met OT Short Term Goal 3 (Week 2): Pt will be able to squat pivot with mod A of 2 to a BSC. OT Short Term Goal 3 - Progress (Week 2): Met OT Short Term Goal 4 (Week 2): Pt will be able to sit to stand with mod A of 2 to increase skills needed for LB dressing. OT Short Term Goal 4 - Progress (Week 2): Met Week 3:  OT Short Term Goal 1 (Week 3): Pt will be able to use LUE to wash R arm, torso and LEs with CGA. OT Short Term Goal 2 (Week 3): Pt will use LUE to pull  a shirt on and off over her head. OT Short Term Goal 3 (Week 3): Pt will be able to tolerate self ROM to RUE with min A.  Skilled Therapeutic Interventions/Progress Updates:    AM session:   Patient seated in recliner, alert and ready for therapy session.  She notes intermittent pain t/o right UE with touch/change of position.  Sit to stand and SPT to/from recliner, w/c, mat table with CG/min A.  Unsupported sitting with CS.  Completed trunk mobility, seated balance, bilateral UE weight bearing, proximal positioning with focus on scapular mobility/stability activities, AROM/AAROM, joint mobilization, right hand on flat paddle for surface and inhibition.  Completed supine proximal stability and mobility activities - increased tolerance for handling/touch and mobility right side - pain appears to bed at muscle/tendon - good ROM at all joints after inhibition and mobilization.  Right side with full elbow against  gravity and increased sup/pron.  She returned to recliner at close of session, seat belt alarm set and call bell in reach.     PM session:   Patient in bed, began session after nursing completed cathing.  Patient able to wash peri area with set up.  Supine to sitting edge of bed with CS.  LB dressing completed seated edge of bed, mod A to donn incontinence brief and pants, mod A to pull pants over hips in stance.  SPT to arm chair CGA.  Returned to bed CGA.  Min A for sit to supine.  Reviewed and completed AROM, inhibition techniques and positioning.  She remained in bed at close of session. Bed alarm set and call bell in reach.      Therapy Documentation Precautions:  Precautions Precautions: Fall Precaution Comments: NPO Restrictions Weight Bearing Restrictions: No   Therapy/Group: Individual Therapy  Carlos Levering 02/08/2020, 7:41 AM

## 2020-02-09 ENCOUNTER — Inpatient Hospital Stay (HOSPITAL_COMMUNITY): Payer: BC Managed Care – PPO | Admitting: Speech Pathology

## 2020-02-09 ENCOUNTER — Inpatient Hospital Stay (HOSPITAL_COMMUNITY): Payer: BC Managed Care – PPO | Admitting: Physical Therapy

## 2020-02-09 ENCOUNTER — Inpatient Hospital Stay (HOSPITAL_COMMUNITY): Payer: BC Managed Care – PPO | Admitting: Occupational Therapy

## 2020-02-09 NOTE — Progress Notes (Signed)
Patient refusing Endure

## 2020-02-09 NOTE — Progress Notes (Signed)
Speech Language Pathology Daily Session Note  Patient Details  Name: BARBIE CROSTON MRN: [id number] Date of Birth: [birth year]  Today's Date: 02/09/2020 SLP Individual Time: 1100-1200 SLP Individual Time Calculation (min): 60 min  Short Term Goals: Week 3: SLP Short Term Goal 1 (Week 3): Patient will consume current diet with minimal overt s/s of aspiration and overall Min A verbal cues for use of swallowing compensatory strategies. SLP Short Term Goal 2 (Week 3): Patient will demonstrate efficient mastication with trials of Dys. 2 textures without overt s/s of aspiration with Min A verbal cues over 2 sessions prior to upgrade. SLP Short Term Goal 3 (Week 3): Patient will demonstrate sustained attention to functional tasks for 10 minutes with Mod verbal cues for redirection. SLP Short Term Goal 4 (Week 3): Patient will demonstrate functional problem solving for basic and familiar tasks with Mod A verbal cues. SLP Short Term Goal 5 (Week 3): Patient will recall new, daily information with Mod A verbal cues for use of external aids.  Skilled Therapeutic Interventions: Skilled treatment session focused on dysphagia and cognitive goals. SLP facilitated session by providing skilled observation with upgraded trial tray of Dys. 2 textures with thin liquids. Patient consumed meal without overt s/s of aspiration and demonstrated mildly prolonged mastication and minimal oral residue that cleared with a liquid wash, therefore, recommend patient upgrade to Dys. 2 textures. SLP also facilitated session by providing Mod A verbal cues for problem solving and selective attention with self-feeding. Patient left upright in the recliner with alarm on and all needs within reach. Continue with current plan of care.      Pain No/Denies Pain   Therapy/Group: Individual Therapy  Righteous Claiborne 02/09/2020, 12:44 PM

## 2020-02-09 NOTE — Patient Care Conference (Signed)
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 Alger Simons Care Coodinator Present: Loralee Pacas, LCSWA;Benjamin Casanas Creig Hines, RN, BSN, [place] Nurse Present: Other (comment) Demetrios Loll, RN) PT Present: Lavone Nian, PT OT Present: Meriel Pica, OT SLP Present: Weston Anna, SLP PPS Coordinator present : Ileana Ladd, Burna Mortimer, SLP     Current Status/Progress Goal Weekly Team Focus  Bowel/Bladder   Inability to void, I/O cath Q6 hrs for no voids cont Flomax  Become continent  Assess QS/PRN, toileting needs,Continue Bladder scans Q6hrs/ I/O cath if no voids   Swallow/Nutrition/ Hydration   Dys. 1 textures with thin liquids via straw, Min A  Min A  trials of upgraded textures   ADL's   CGA to stand, min to CGA to stand pivot and take a few steps,  good sitting balance and midline awareness, LUE now moving functionally, RUE tolerating more movement; mod A overall with dressing and toileting; min A with bathing  min A  ADL training; RUE NMR, balance, family education   Mobility   min assist sit<>stand, min/mod assist gait without AD x 40 ft  upgraded to min assist<>supervision overall  bed mobility, transfers, balance, cognition, activity tolerance, NMR, strengthening, gait, stairs as able    Communication   Supervision for speech intelligibility  Min A  use of speech intelligibility strategies at the sentence level   Safety/Cognition/ Behavioral Observations  Min-Mod A  Min A  selective attention, functional problem solving, awareness   Pain   Headaches  ,< OR = to 3  Assess /QS and prn with medication as ordered   Skin   Bruising improving to abdomen and arms  No skin breakdown.frequent repositioning ongoing  Assess QS/PRN     Discharge Planning:  D/c to home with husband who will provide 24/7 care pending pt level of care needs at d/c.   Team Discussion: Incontinent B/B, will work on going to the bathroom. Hand splint to go on at night. OT progress with mobility and RUE is improving. PT goals upgraded to Supervision/contact guard. SLP trial tray of Dys 2 today. Patient on target to meet rehab goals: yes  *See Care Plan and progress notes for long and short-term goals.   Revisions to Treatment Plan:  MD to discontinue Ensure.  Teaching Needs: Continue with family education.  Current Barriers to Discharge: Incontinence and Nutritional means  Possible Resolutions to Barriers: Continue to work on incontinence, and improve nutrition and po intake.     Medical Summary Current Status: continued progress with awareness, arousal, initiation. now on diet. no further GIB. incontinent still  Barriers to Discharge: Medical stability   Possible Resolutions to Barriers/Weekly Focus: improve nutritional/po intake, bowel/bladder program to improve continence, pain control   Continued Need for Acute Rehabilitation Level of Care:  The patient requires daily medical management by a physician with specialized training in physical medicine and rehabilitation for the following reasons: Direction of a multidisciplinary physical rehabilitation program to maximize functional independence : Yes Medical management of patient stability for increased activity during participation in an  intensive rehabilitation regime.: Yes Analysis of laboratory values and/or radiology reports with any subsequent need for medication adjustment and/or medical intervention. : Yes   I attest that I was present, lead the team conference, and concur with the assessment and plan of the team.   Cristi Loron 02/09/2020, 3:35 PM

## 2020-02-09 NOTE — Progress Notes (Signed)
Patient ID: Erika Stanton, female   DOB: Dec 05, 1955, 64 y.o.   MRN: [id number]  SW met with pt and pt husband in room to provide updates from team conference, and d/c date remains the same date 10/12. SW discussed family education with husband. SW to follow-up by Friday to get dates he will be present.   Loralee Pacas, MSW, [place] Office: [phone] Cell: [phone] Fax: [phone]

## 2020-02-09 NOTE — Progress Notes (Signed)
[place] PHYSICAL MEDICINE & REHABILITATION PROGRESS NOTE   Subjective/Complaints: Up in chair working on breakfast. No new complaints today. lifted right arm!Marland Kitchen   ROS: Patient denies fever, rash, sore throat, blurred vision, nausea, vomiting, diarrhea, cough, shortness of breath or chest pain,   headache, or mood change.     Objective:   No results found. Recent Labs    02/08/20 0652  WBC 5.8  HGB 11.7*  HCT 37.7  PLT 311   Recent Labs    02/08/20 0652  NA 138  K 3.9  CL 102  CO2 24  GLUCOSE 104*  BUN 9  CREATININE 0.49  CALCIUM 9.1    Intake/Output Summary (Last 24 hours) at 02/09/2020 1240 Last data filed at 02/09/2020 0900 Gross per 24 hour  Intake 932 ml  Output 2000 ml  Net -1068 ml     Physical Exam: Vital Signs Blood pressure 115/77, pulse 100, temperature 98.3 F (36.8 C), resp. rate 16, height 5\' 6"  (1.676 m), weight 89.5 kg, SpO2 96 %.  Constitutional: No distress . Vital signs reviewed. HEENT: EOMI, oral membranes moist Neck: supple Cardiovascular: RRR without murmur. No JVD    Respiratory/Chest: CTA Bilaterally without wheezes or rales. Normal effort    GI/Abdomen: BS +, non-tender, non-distended Ext: no clubbing, cyanosis, or edema Psych: pleasant and cooperative Neurologic:very alert. Good eye contact. Improved insight and awareness. Speech quality and volume are great!.  Trace to 2+ bicep right UE, otherwise tr to 0/5 RUE, RLE 0/5, LUE 2+/5 , LLE 4/5. Right arm remains sensitive to touch withdecreased LT discrimination Musculoskeletal: Right arm still tender with ROM . No joint swelling Skin: bruises resolving   Assessment/Plan: 1. 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  Physiatrist is providing close team supervision and 24 hour management of active medical problems listed below.  Physiatrist and  rehab team continue to assess barriers to discharge/monitor patient progress toward functional and medical goals  Care Tool:  Bathing    Body parts bathed by patient: Front perineal area, Abdomen, Left upper leg, Right upper leg   Body parts bathed by helper: Buttocks, Right lower leg, Left lower leg, Face, Right arm, Left arm     Bathing assist Assist Level: Maximal Assistance - Patient 24 - 49%     Upper Body Dressing/Undressing Upper body dressing   What is the patient wearing?: Pull over shirt    Upper body assist Assist Level: Maximal Assistance - Patient 25 - 49%    Lower Body Dressing/Undressing Lower body dressing      What is the patient wearing?: Pants, Incontinence brief     Lower body assist Assist for lower body dressing: Maximal Assistance - Patient 25 - 49%     Toileting Toileting    Toileting assist Assist for toileting: Dependent - Patient 0%     Transfers Chair/bed transfer  Transfers assist     Chair/bed transfer assist level: Minimal Assistance - Patient > 75%     Locomotion Ambulation   Ambulation assist      Assist level: Moderate Assistance - Patient 50 - 74% Assistive device: No Device Max distance: 40 ft   Walk 10 feet activity   Assist     Assist level: Moderate Assistance - Patient - 50 - 74% Assistive device: No Device   Walk 50 feet activity   Assist Walk 50 feet with 2 turns activity  did not occur: Safety/medical concerns         Walk 150 feet activity   Assist Walk 150 feet activity did not occur: Safety/medical concerns         Walk 10 feet on uneven surface  activity   Assist Walk 10 feet on uneven surfaces activity did not occur: Safety/medical concerns         Wheelchair     Assist Will patient use wheelchair at discharge?: Yes Type of Wheelchair: Manual           Wheelchair 50 feet with 2 turns activity    Assist    Wheelchair 50 feet with 2 turns activity did not  occur: Safety/medical concerns (requires TIS for postural support)       Wheelchair 150 feet activity     Assist  Wheelchair 150 feet activity did not occur: Safety/medical concerns       Blood pressure 115/77, pulse 100, temperature 98.3 F (36.8 C), resp. rate 16, height 5\' 6"  (1.676 m), weight 89.5 kg, SpO2 96 %.  Medical Problem List and Plan: 1.  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  Continue CIR, PT, OT, SLP     -making progressive gains with mobility, speech, cognition and swallowing.  2.  Antithrombotics: -DVT/anticoagulation:  Pharmaceutical: Heparin--hold                          Dopplers negative for DVT.              -antiplatelet therapy: DAPT--resumed plavix, asa without problems   3. Pain Management: Tylenol prn.    -added gabapentin 100mg  tid for neuropathic pain 9/23  -9/24,still tender,  observe for effect  -9/28 right arm is less tender. Continue gabapentin  4. Mood: LCSW to follow for evaluation and support when appropriate.              -antipsychotic agents: N/A 5. Neuropsych: This patient is not capable of making decisions on her own behalf.  Continue trial of ritalin  for concentration and arousal    -continue with 5mg  dose for now   -arousal and mentation gradually improving. Appears brighter.  6. Skin/Wound Care: routine pressure relief measures. May d/c IV 7. Fluids/Electrolytes/Nutrition:   -on D1/thins,labs ok yesterday  -encouraging PO. Eating 40-60%. Seemed to eat more for breakfast  Labs look great 9/27, prealbumin 24.5   8. ABLA/UGIB:     EGD 9/15 apparently demonstrated non-bleeding esophagitis/gastritis, HH   -husband doesn't want to pursue further testing  9/17: resumed plavix/asa. Will not resume sq heparin    -protonix per tube now (or IV if needed today)   -GI consult  appreciated   -hgb holding at 11.7 9/27     9. 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 Resting tachycardia:              has been well controlled.  12. Vasospasm prevention: To continue Nimotop for 3 additional days.  13. Bowel incontinence/loose stool: multifactorial, on abx for uti also  -  fiber/probiotic  -9/23  cholestyramine TID--dc'ed  -had large soft stool 9/26 14. Proteus UTI/enteroccus UTI: Ceftriaxone course completed for proteus  -amoxil x 5 days for enterococcus  complete 9/24 18.  Prediabetes,  Relatively controlled. May d/c CBGs  19.  Leukoctyosis: resolved        LOS: 20 days A FACE TO FACE EVALUATION WAS PERFORMED  Meredith Staggers 02/09/2020, 12:40 PM

## 2020-02-09 NOTE — Progress Notes (Signed)
Physical Therapy Session Note  Patient Details  Name: Erika Stanton MRN: [id number] Date of Birth: 1956-01-06  Today's Date: 02/09/2020 PT Individual Time: 1417-1535 PT Individual Time Calculation (min): 78 min   Short Term Goals: Week 3:  PT Short Term Goal 1 (Week 3): Pt will perform supine<>sit with CGA PT Short Term Goal 2 (Week 3): Pt will perform sit<>stands using LRAD with CGA PT Short Term Goal 3 (Week 3): Pt will perform bed<>chair transfers using LRAD with CGA PT Short Term Goal 4 (Week 3): Pt will ambulate at least 39ft using LRAD with min assist PT Short Term Goal 5 (Week 3): Pt will ascend/descend 4 steps using HRs with mod assist  Skilled Therapeutic Interventions/Progress Updates:  Pt received in bed with husband present but exiting session. Pt agreeable to tx, completes supine<>sit with supervision, hospital bed features. PT dons RUE giv mor sling for support & attempts to adjust sling for increased comfort. Pt requests to brush teeth standing at sink & does so with assist to put toothpaste on toothbrush and retreive cup, CGA for standing balance. Transported pt outside [place] tower via w/c dependent assist for time management. Sit<>stand with min assist & gait x 40 ft + 60 ft + 75 ft without AD & min assist with PT providing education re: gait pattern with cuing for pt to increase step width LLE & decrease L Lean with fair return demo. Educated pt that she should not consume alcohol when she returns home (pt reports she enjoys a beer on the weekends) & encouraged her to discuss this with MD. Also encouraged pt to attempt stair negotiation during session but pt requests to wait until next week with PT educating her on plans to attempt stair negotiation this week. Back on unit, pt utilizes nu-step with BLE & LUE on level 3 x 5 minutes + 5 minutes with prolonged rest break between with task focusing on strengthening & endurance training & NMR. Back in room, pt ambulates to bed in same  manner. Pt scoots to [facility] with cuing for technique & bed in trendelenburg position. PT educates husband on OPPT f/u. Pt left in bed with alarm set, call bell in reach, husband present to supervise.   Pt with several instances of laughing outbursts during session.  Therapy Documentation Precautions:  Precautions Precautions: Fall Precaution Comments: NPO Restrictions Weight Bearing Restrictions: No  Pain: Pt notes low back pain 6/10 - rest breaks provided PRN, pt declines asking nurse for pain meds   Therapy/Group: Individual Therapy  [name] 02/09/2020, 4:10 PM

## 2020-02-09 NOTE — Progress Notes (Signed)
Occupational Therapy Session Note  Patient Details  Name: Erika Stanton MRN: [id number] Date of Birth: 17-Mar-1956  Today's Date: 02/09/2020 OT Individual Time: 0945-1100 OT Individual Time Calculation (min): 75 min    Short Term Goals: Week 3:  OT Short Term Goal 1 (Week 3): Pt will be able to use LUE to wash R arm, torso and LEs with CGA. OT Short Term Goal 2 (Week 3): Pt will use LUE to pull  a shirt on and off over her head. OT Short Term Goal 3 (Week 3): Pt will be able to tolerate self ROM to RUE with min A.  Skilled Therapeutic Interventions/Progress Updates:    Patient seated in recliner, denies pain at this time.  Sit to stand min A.  Ambulation without AD to/from recliner, toilet, shower bench, arm chair, w/c and bed during session with CGA  (min/mod A at times to stand from lower surfaces)  Continent of bowel and bladder - max A for hygiene and CM.  Bathing completed seated on shower bench - mod A overall for bathing (assistance for thoroughness left UE, lower legs and buttocks)    Dressing completed seated arm chair - mod A for [place] shirt, mod A for incontinence brief and pants, max A CM in stance, min A for slip on shoes.  Max A with attempts to comb/brush hair.  Returned to bed at close of session.  Sit to supine with CS.  She remained in bed, bed alarm set and call bell in reach.    Therapy Documentation Precautions:  Precautions Precautions: Fall Precaution Comments: NPO Restrictions Weight Bearing Restrictions: No   Therapy/Group: Individual Therapy  Erika Stanton 02/09/2020, 7:32 AM

## 2020-02-10 ENCOUNTER — Inpatient Hospital Stay (HOSPITAL_COMMUNITY): Payer: BC Managed Care – PPO | Admitting: Speech Pathology

## 2020-02-10 ENCOUNTER — Inpatient Hospital Stay (HOSPITAL_COMMUNITY): Payer: BC Managed Care – PPO | Admitting: Occupational Therapy

## 2020-02-10 ENCOUNTER — Inpatient Hospital Stay (HOSPITAL_COMMUNITY): Payer: BC Managed Care – PPO | Admitting: Physical Therapy

## 2020-02-10 ENCOUNTER — Inpatient Hospital Stay (HOSPITAL_COMMUNITY): Payer: BC Managed Care – PPO

## 2020-02-10 NOTE — Progress Notes (Signed)
Occupational Therapy Session Note  Patient Details  Name: Erika Stanton MRN: [id number] Date of Birth: 03-Apr-1956  Today's Date: 02/10/2020 OT Individual Time: 1300-1330 OT Individual Time Calculation (min): 30 min    Short Term Goals: Week 1:  OT Short Term Goal 1 (Week 1): patient will roll in bed with min A, move from side lying to sitting edge of bed with mod A of one OT Short Term Goal 1 - Progress (Week 1): Met OT Short Term Goal 2 (Week 1): patient will tolerate unsupported sitting with min A for 10 minutes with head midline OT Short Term Goal 2 - Progress (Week 1): Met OT Short Term Goal 3 (Week 1): patient will use left UE to wash 25% of upper body and assist with donning [place] shirt max A OT Short Term Goal 3 - Progress (Week 1): Progressing toward goal OT Short Term Goal 4 (Week 1): patient will scan environment both right and left to locate items with moderate cues OT Short Term Goal 4 - Progress (Week 1): Met  Skilled Therapeutic Interventions/Progress Updates:    1:1. Pt received in recliner agreeable to OT. Pt completes transfer with GivMohr sling in place recliner>w/c<>TTB/couch/EOB with CGA overall excpet min boosting A to get up from low couch. Pt with improved control ov BLE and tolerates sling well when upright. Reports pain in RUE/hand with sling on in sitting, plastic hand support removed in stiiting and pain relieved. Pt edu re DME/shower modifications to improve safety and sitting on L side of couch at home to have arm rest to power up with. Exited sessionw iht pt seated in bed, exit alarm on and call light in reach  Therapy Documentation Precautions:  Precautions Precautions: Fall Precaution Comments: NPO Restrictions Weight Bearing Restrictions: No General:   Vital Signs:   Pain:   ADL: ADL Eating: NPO Grooming: Dependent Where Assessed-Grooming: Bed level Upper Body Bathing: Maximal assistance Where Assessed-Upper Body Bathing: Shower Lower Body  Bathing: Maximal assistance Where Assessed-Lower Body Bathing: Shower Upper Body Dressing: Maximal assistance Where Assessed-Upper Body Dressing: Wheelchair Lower Body Dressing: Maximal assistance Where Assessed-Lower Body Dressing: Wheelchair, Standing at sink, Sitting at sink Toileting: Dependent Where Assessed-Toileting: Bed level Social research officer, government: Minimal assistance Social research officer, government Method: Stand pivot ADL Comments: patient able to hold wash cloth with left hand but unable to manipulate for washing, she is able to lift each leg for donning pants/socks Vision   Perception    Praxis   Exercises:   Other Treatments:     Therapy/Group: Individual Therapy  Tonny Branch 02/10/2020, 1:30 PM

## 2020-02-10 NOTE — Progress Notes (Signed)
Speech Language Pathology Daily Session Note  Patient Details  Name: Erika Stanton MRN: [id number] Date of Birth: 1955/12/16  Today's Date: 02/10/2020 SLP Individual Time: 0725-0820 SLP Individual Time Calculation (min): 55 min  Short Term Goals: Week 3: SLP Short Term Goal 1 (Week 3): Patient will consume current diet with minimal overt s/s of aspiration and overall Min A verbal cues for use of swallowing compensatory strategies. SLP Short Term Goal 2 (Week 3): Patient will demonstrate efficient mastication with trials of Dys. 2 textures without overt s/s of aspiration with Min A verbal cues over 2 sessions prior to upgrade. SLP Short Term Goal 3 (Week 3): Patient will demonstrate sustained attention to functional tasks for 10 minutes with Mod verbal cues for redirection. SLP Short Term Goal 4 (Week 3): Patient will demonstrate functional problem solving for basic and familiar tasks with Mod A verbal cues. SLP Short Term Goal 5 (Week 3): Patient will recall new, daily information with Mod A verbal cues for use of external aids.  Skilled Therapeutic Interventions:Skilled treatment session focused on dysphagia and cognitive goals. SLP facilitated session by providing skilled observation with breakfast tray of Dys. 2 textures with thin liquids. Patient consumed meal without overt s/s of aspiration and demonstrated efficient mastication and complete oral clearance. Recommend patient continue current diet. SLP also facilitated session by providing Mod A verbal cues for problem solving and selective attention with self-feeding. Patient left upright in the recliner with alarm on and all needs within reach. Continue with current plan of care.      Pain No/Denies Pain   Therapy/Group: Individual Therapy  Tryston Gilliam 02/10/2020, 2:19 PM

## 2020-02-10 NOTE — Progress Notes (Signed)
Occupational Therapy Session Note  Patient Details  Name: Erika Stanton MRN: [id number] Date of Birth: [birth year]  Today's Date: 02/10/2020 OT Individual Time: 0945-1100 OT Individual Time Calculation (min): 75 min    Short Term Goals: Week 3:  OT Short Term Goal 1 (Week 3): Pt will be able to use LUE to wash R arm, torso and LEs with CGA. OT Short Term Goal 2 (Week 3): Pt will use LUE to pull  a shirt on and off over her head. OT Short Term Goal 3 (Week 3): Pt will be able to tolerate self ROM to RUE with min A.  Skilled Therapeutic Interventions/Progress Updates:    Pt received in bed stating she did not need a shower today and was fine in the clothing she had on. She did sit to EOB with min A, donned shoes with min A, and then min -mod to stand from bed.  Pt kept wanting to sit down as she was feeling very fatigued.   Due to fatigue, began session supine to work on RUE scapular mobility with PROM, a/arom for scapular protraction and tricep extension, PROM for external rotation, and active scapular retraction with sh extension. Pt has an anterior subluxation so working on pulling shoulder back is very important for her to focus on. Sat up to EOB to use UE ranger but pt could only tolerate small movement with R sh.   Pt continues to c/o pain with the slightest mobility but does seem to tolerate better.  Obtained a Give Mohr sling and fitted the small to fit her. She needs a medium size and will request MD to order one for her.  Using sling for support, pt ambulated to sink to brush teeth with A for opening containers, applying toothpaste. Pt stood with CGA for several minutes. Pt wanted to then sit and rest.  Per OT note yesterday, pt is still max A with toileting so to work on balance and LUE coordination and strength had pt practice pulling up and pushing down a theraband wrapped around her waist to simulate clothing management. Pt was able to get all the way up except for 25% of her R  hip.   LUE resisted pushing and pulling on theraband to challenge strength.  Pt very fatigued and did not want to sit in recliner.  Transferred back to bed to rest. Pt in bed with all needs met. Bed alarm set.   Therapy Documentation Precautions:  Precautions Precautions: Fall Precaution Comments: NPO Restrictions Weight Bearing Restrictions: No   Pain:   ADL: ADL Eating: NPO Grooming: Dependent Where Assessed-Grooming: Bed level Upper Body Bathing: Maximal assistance Where Assessed-Upper Body Bathing: Shower Lower Body Bathing: Maximal assistance Where Assessed-Lower Body Bathing: Shower Upper Body Dressing: Maximal assistance Where Assessed-Upper Body Dressing: Wheelchair Lower Body Dressing: Maximal assistance Where Assessed-Lower Body Dressing: Wheelchair, Standing at sink, Sitting at sink Toileting: Dependent Where Assessed-Toileting: Bed level Social research officer, government: Minimal assistance Social research officer, government Method: Stand pivot ADL Comments: patient able to hold wash cloth with left hand but unable to manipulate for washing, she is able to lift each leg for donning pants/socks     Therapy/Group: Individual Therapy  [place] 02/10/2020, 9:38 AM

## 2020-02-10 NOTE — Progress Notes (Signed)
Physical Therapy Weekly Progress Note  Patient Details  Name: Erika Stanton MRN: [id number] Date of Birth: 19-May-1955  Beginning of progress report period: February 04, 2020 End of progress report period: February 10, 2020  Today's Date: 02/10/2020 PT Individual Time: 1657-9038 PT Individual Time Calculation (min): 44 min   Patient has met 3 of 5 short term goals.  Pt is making good progress towards LTG's. Pt currently ambulates short distances (~40 ft) without AD & min assist, negotiates 4 steps (6") with L rail & mod assist, and completes bed mobility with supervision with hospital bed features. Pt continues to demonstrate decreased endurance, awareness, and balance, as well as impaired use of BUE but is making good, steady progress overall. Pt would benefit from continued skilled PT treatment to focus on deficits noted above & below to increase independence prior to d/c. Pt's husband will need to participate in hands on caregiver training prior to pt's d/c to ensure safety for pt & husband.  Patient continues to demonstrate the following deficits muscle weakness and muscle joint tightness, decreased cardiorespiratoy endurance, decreased coordination, decreased attention, decreased awareness, decreased problem solving, decreased safety awareness, decreased memory and delayed processing, and decreased standing balance, decreased postural control and decreased balance strategies and therefore will continue to benefit from skilled PT intervention to increase functional independence with mobility.  Patient progressing toward long term goals..  Continue plan of care.  PT Short Term Goals Week 3:  PT Short Term Goal 1 (Week 3): Pt will perform supine<>sit with CGA PT Short Term Goal 1 - Progress (Week 3): Met PT Short Term Goal 2 (Week 3): Pt will perform sit<>stands using LRAD with CGA PT Short Term Goal 2 - Progress (Week 3): Progressing toward goal PT Short Term Goal 3 (Week 3): Pt will  perform bed<>chair transfers using LRAD with CGA PT Short Term Goal 3 - Progress (Week 3): Progressing toward goal PT Short Term Goal 4 (Week 3): Pt will ambulate at least 23f using LRAD with min assist PT Short Term Goal 4 - Progress (Week 3): Met PT Short Term Goal 5 (Week 3): Pt will ascend/descend 4 steps using HRs with mod assist PT Short Term Goal 5 - Progress (Week 3): Met Week 4:  PT Short Term Goal 1 (Week 4): Pt will complete bed<>w/c consistently with CGA & LRAD. PT Short Term Goal 2 (Week 4): Pt will ambulate 50 ft with LRAD & CGA. PT Short Term Goal 3 (Week 4): Pt will negotiate 8 steps with LUE support & mod assist.  Skilled Therapeutic Interventions/Progress Updates:  Pt received in bed & agreeable to tx. Supine<>sit with supervision, HOB slightly elevated, and bed rails; PT provides cuing and instruction [facility] Sit<>stand throughout session with min assist. Pt reports need to void so ambulates into bathroom without AD & min assist with pt able to verbalize need for increased step width LLE; pt continues to demonstrate L lateral lean. Pt performs toilet transfer with min assist & PT assisting with clothing management. Pt with continent void with extra time on toilet & performs peri hygiene with encouragement to attempt & LUE. PT assists pt with washing L hand at sink. Pt completes car transfer at small SUV simulated height with min assist & cuing to sit then place BLE in/out of car vs stepping in. PT educates pt on compensatory pattern for stair negotiation & negotiates 4 steps (6") with L UE support & mod assist with noticeable fatigue during/after task. Back  in room, pt's husband reports she will only have LUE ascending support & they cannot install rail on opposite side but he can provide assistance, which PT reports pt will likely need. At end of session pt left in bed with alarm set, call bell in reach, husband in room.  Pain: no formal c/o pain  Therapy  Documentation Precautions:  Precautions Precautions: Fall Precaution Comments: NPO Restrictions Weight Bearing Restrictions: No   Therapy/Group: Individual Therapy  [name] 02/10/2020, 4:30 PM

## 2020-02-10 NOTE — Progress Notes (Signed)
Nutrition Follow-up  RD working remotely.  DOCUMENTATION CODES:   Not applicable  INTERVENTION:   - Please obtain updated weight, last weight is from 9/08  - Magic cup TID with meals, each supplement provides 290 kcal and 9 grams of protein  NUTRITION DIAGNOSIS:   Inadequate oral intake related to dysphagia, lethargy/confusion as evidenced by meal completion < 50%.  Progressing  GOAL:   Patient will meet greater than or equal to 90% of their needs  Progressing  MONITOR:   PO intake, Supplement acceptance, Diet advancement, Labs, Weight trends  REASON FOR ASSESSMENT:   Consult Enteral/tube feeding initiation and management  ASSESSMENT:   64 year old female who was admitted on 01/05/20 with right-sided headache and left hemiparesis. 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 Pt remains NPO due to signs of aspiration with tube feeds via Cortrak. Admitted to CIR on 9/08.  9/15 - s/p EGD with findings of non-bleeding reflux esophagitis, Cortrak removed 9/17 - MBS with recommendations for dysphagia 1 diet with nectar-thick liquids, Cortrak replaced, tip gastric per Cortrak team 9/22 - NPO 9/23 - MBS with recommendations for dysphagia 1 diet with thin liquids, Cortrak removed and TF d/c 8/28 - diet advanced to dysphagia 2  Noted target d/c date of 10/12.  Pt refusing most Ensure Enlive supplements per [facility] documentation. Will d/c these and continue Magic Cups with meals.  No new weights since 9/08.  Unable to reach pt via phone call today.  Meal Completion: 30-60%  Medications reviewed and include: Ensure Enlive  TID (pt refusing), SSI, ritalin, protonix, fibercon, KCl, florastor  Labs reviewed.  Diet Order:   Diet Order            DIET DYS 2 Room service appropriate? Yes; Fluid consistency: Thin  Diet effective 1000                 EDUCATION NEEDS:   No education needs have been identified at this time  Skin:  Skin Assessment: Reviewed RN Assessment  Last BM:  02/10/20 medium type 4  Height:   Ht Readings from Last 1 Encounters:  01/20/20 5\' 6"  (1.676 m)    Weight:   Wt Readings from Last 1 Encounters:  01/20/20 89.5 kg    Ideal Body Weight:  59.1 kg  BMI:  Body mass index is 31.85 kg/m.  Estimated Nutritional Needs:   Kcal:  1900-2100  Protein:  95-115 grams  Fluid:  >/= 1.9 L    Gaynell Face, MS, RD, LDN Inpatient Clinical Dietitian Please see [facility] for contact information.

## 2020-02-10 NOTE — Progress Notes (Addendum)
Patient ID: Erika Stanton, female   DOB: March 30, 1956, 64 y.o.   MRN: [id number]   SW received updates from pt husband with regard to short term disability forms. SW informed husband will follow-up with medical team with regard to status of forms. SW reached out to medical team and waiting on updates.  *SW left completed STD form in room with pt.   Loralee Pacas, MSW, [place] Office: [phone] Cell: [phone] Fax: [phone]

## 2020-02-11 ENCOUNTER — Inpatient Hospital Stay (HOSPITAL_COMMUNITY): Payer: BC Managed Care – PPO | Admitting: Occupational Therapy

## 2020-02-11 ENCOUNTER — Inpatient Hospital Stay (HOSPITAL_COMMUNITY): Payer: BC Managed Care – PPO | Admitting: Speech Pathology

## 2020-02-11 ENCOUNTER — Inpatient Hospital Stay (HOSPITAL_COMMUNITY): Payer: BC Managed Care – PPO

## 2020-02-11 MED ORDER — POTASSIUM CHLORIDE CRYS ER 20 MEQ PO TBCR
40.0000 meq | EXTENDED_RELEASE_TABLET | Freq: Every day | ORAL | Status: DC
  Administered 2020-02-11 – 2020-02-18 (×8): 40 meq via ORAL
  Filled 2020-02-11 (×8): qty 2

## 2020-02-11 NOTE — Progress Notes (Signed)
Speech Language Pathology Daily Session Note  Patient Details  Name: ISLAND DOHMEN MRN: [id number] Date of Birth: 22-Jan-1956  Today's Date: 02/11/2020 SLP Individual Time: 0725-0825 SLP Individual Time Calculation (min): 60 min  Short Term Goals: Week 3: SLP Short Term Goal 1 (Week 3): Patient will consume current diet with minimal overt s/s of aspiration and overall Min A verbal cues for use of swallowing compensatory strategies. SLP Short Term Goal 2 (Week 3): Patient will demonstrate efficient mastication with trials of Dys. 2 textures without overt s/s of aspiration with Min A verbal cues over 2 sessions prior to upgrade. SLP Short Term Goal 3 (Week 3): Patient will demonstrate sustained attention to functional tasks for 10 minutes with Mod verbal cues for redirection. SLP Short Term Goal 4 (Week 3): Patient will demonstrate functional problem solving for basic and familiar tasks with Mod A verbal cues. SLP Short Term Goal 5 (Week 3): Patient will recall new, daily information with Mod A verbal cues for use of external aids.  Skilled Therapeutic Interventions: Skilled treatment session focused on dysphagia and cognitive goals. SLP facilitated session by providing skilled observation with breakfast tray of Dys. 2 textures with thin liquids. Patient consumed meal without overt s/s of aspiration and demonstrated efficient mastication and complete oral clearance. Recommend patient continue current diet. SLP also facilitated session by providing Min A verbal cues for problem solving and Mod A verbal cues for selective attention to self-feeding. Patient transferred back to bed and left with alarm on and all needs within reach. Continue with current plan of care.     Pain No/Denies Pain   Therapy/Group: Individual Therapy  Rupinder Livingston 02/11/2020, 12:33 PM

## 2020-02-11 NOTE — Progress Notes (Signed)
Patient ID: Erika Stanton, female   DOB: 05-02-1956, 64 y.o.   MRN: [id number]  SW received phone call from pt husband to discuss scheduling family edu. Tuesday (10/5) 1pm-3pm and Thursday (10/7) 9am until completed.   Loralee Pacas, MSW, [place] Office: [phone] Cell: [phone] Fax: [phone]

## 2020-02-11 NOTE — Progress Notes (Signed)
[place] PHYSICAL MEDICINE & REHABILITATION PROGRESS NOTE   Subjective/Complaints: Up in chair working on breakfast. No new complaints today. lifted right arm!Marland Kitchen   ROS: Patient denies fever, rash, sore throat, blurred vision, nausea, vomiting, diarrhea, cough, shortness of breath or chest pain,   headache, or mood change.     Objective:   No results found. No results for input(s): WBC, HGB, HCT, PLT in the last 72 hours. No results for input(s): NA, K, CL, CO2, GLUCOSE, BUN, CREATININE, CALCIUM in the last 72 hours.  Intake/Output Summary (Last 24 hours) at 02/11/2020 1022 Last data filed at 02/11/2020 0900 Gross per 24 hour  Intake 1067 ml  Output 700 ml  Net 367 ml     Physical Exam: Vital Signs Blood pressure 118/73, pulse 74, temperature (!) 97.5 F (36.4 C), resp. rate 17, height 5\' 6"  (1.676 m), weight 89.5 kg, SpO2 100 %.  Constitutional: No distress . Vital signs reviewed. HEENT: EOMI, oral membranes moist Neck: supple Cardiovascular: RRR without murmur. No JVD    Respiratory/Chest: CTA Bilaterally without wheezes or rales. Normal effort    GI/Abdomen: BS +, non-tender, non-distended Ext: no clubbing, cyanosis, or edema Psych: pleasant and cooperative Neurologic:very alert. Good eye contact. Improved insight and awareness. Speech quality and volume are great!.  Trace to 2+ bicep right UE, otherwise tr to 0/5 RUE, RLE 0/5, LUE 2+/5 , LLE 4/5. Right arm remains sensitive to touch withdecreased LT discrimination Musculoskeletal: Right arm still tender with ROM . No joint swelling Skin: bruises resolving   Assessment/Plan: 1. 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  Physiatrist is providing close team supervision and 24 hour management of active medical problems listed below.  Physiatrist and rehab team continue to assess barriers  to discharge/monitor patient progress toward functional and medical goals  Care Tool:  Bathing    Body parts bathed by patient: Front perineal area, Abdomen, Left upper leg, Right upper leg   Body parts bathed by helper: Buttocks, Right lower leg, Left lower leg, Face, Right arm, Left arm     Bathing assist Assist Level: Maximal Assistance - Patient 24 - 49%     Upper Body Dressing/Undressing Upper body dressing   What is the patient wearing?: Pull over shirt    Upper body assist Assist Level: Maximal Assistance - Patient 25 - 49%    Lower Body Dressing/Undressing Lower body dressing      What is the patient wearing?: Pants, Incontinence brief     Lower body assist Assist for lower body dressing: Maximal Assistance - Patient 25 - 49%     Toileting Toileting    Toileting assist Assist for toileting: Dependent - Patient 0%     Transfers Chair/bed transfer  Transfers assist     Chair/bed transfer assist level: Minimal Assistance - Patient > 75%     Locomotion Ambulation   Ambulation assist      Assist level: Minimal Assistance - Patient > 75% Assistive device: No Device Max distance: 20 ft   Walk 10 feet activity   Assist     Assist level: Minimal Assistance - Patient > 75% Assistive device: No Device   Walk 50 feet activity   Assist Walk 50 feet with 2 turns activity did not occur: Safety/medical concerns  Assist level: Minimal Assistance - Patient > 75% Assistive device: No Device    Walk 150 feet activity  Assist Walk 150 feet activity did not occur: Safety/medical concerns         Walk 10 feet on uneven surface  activity   Assist Walk 10 feet on uneven surfaces activity did not occur: Safety/medical concerns         Wheelchair     Assist Will patient use wheelchair at discharge?: Yes Type of Wheelchair: Manual           Wheelchair 50 feet with 2 turns activity    Assist    Wheelchair 50 feet with 2  turns activity did not occur: Safety/medical concerns (requires TIS for postural support)       Wheelchair 150 feet activity     Assist  Wheelchair 150 feet activity did not occur: Safety/medical concerns       Blood pressure 118/73, pulse 74, temperature (!) 97.5 F (36.4 C), resp. rate 17, height 5\' 6"  (1.676 m), weight 89.5 kg, SpO2 100 %.  Medical Problem List and Plan: 1.  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  Continue CIR, PT, OT, SLP     -making progressive in all areas  -spoke to husband who's pleased. Shifting focus to preparation at home  2.  Antithrombotics: -DVT/anticoagulation:  Pharmaceutical: Heparin--hold                          Dopplers negative for DVT.              -antiplatelet therapy: DAPT--resumed plavix, asa without problems   3. Pain Management: Tylenol prn.    -neuropathic pain RUE  -9/30 right arm much improved. Continue same gabapentin 4. Mood: LCSW to follow for evaluation and support when appropriate.              -antipsychotic agents: N/A 5. Neuropsych: This patient is not capable of making decisions on her own behalf.  Continue trial of ritalin  for concentration and arousal    -continue with 5mg  dose for now   -arousal and mentation much improved 6. Skin/Wound Care: routine pressure relief measures. May d/c IV 7. Fluids/Electrolytes/Nutrition:   -on D2/thins, eating 30-50%. Encouraged to pick up intake   -husband says she's a picky eater   -asked him to bring food from home she likes   -check labs tomorrow  8. ABLA/UGIB:     EGD 9/15 apparently demonstrated non-bleeding esophagitis/gastritis, HH   -husband doesn't want to pursue further testing  9/17: resumed plavix/asa. Will not resume sq heparin    -protonix per tube now (or IV if needed today)   -GI consult appreciated   -hgb holding  at 11.7 9/27     9. 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 Resting tachycardia:              has been well controlled.  12. Vasospasm prevention: To continue Nimotop for 3 additional days.  13. Bowel incontinence/loose stool: multifactorial, on abx for uti also  -fiber/probiotic  -stools formed, had bm 9/29 14. Proteus UTI/enteroccus UTI: Ceftriaxone course completed for proteus  -amoxil x 5 days for enterococcus  complete 9/24 18.  Prediabetes,    Relatively controlled. May d/c CBGs  19.  Leukoctyosis: resolved        LOS: 22 days A FACE TO  [place] EVALUATION WAS PERFORMED  Meredith Staggers 02/11/2020, 10:22 AM

## 2020-02-11 NOTE — Progress Notes (Signed)
Occupational Therapy Session Note  Patient Details  Name: Erika Stanton MRN: [id number] Date of Birth: 11/30/1955  Today's Date: 02/11/2020 OT Individual Time: 0900-1000   &   1112-1208 OT Individual Time Calculation (min): 60 min    &   56 min   Short Term Goals: Week 3:  OT Short Term Goal 1 (Week 3): Pt will be able to use LUE to wash R arm, torso and LEs with CGA. OT Short Term Goal 2 (Week 3): Pt will use LUE to pull  a shirt on and off over her head. OT Short Term Goal 3 (Week 3): Pt will be able to tolerate self ROM to RUE with min A.  Skilled Therapeutic Interventions/Progress Updates:    session 1:   Patient in bed, alert and ready for therapy session.  Supine to sit with CS.  Able to donn slip on shoes with min A.  Sit to stand and Ambulate without AD to/from bed, toilet, shower bench, w/c, arm chair with CGA.  Bathing completed seated on shower bench with hand held shower - assistance for thoroughness with bilateral UEs, buttocks and left LE.  Dressing completed seated on arm chair - mod a for [place] shirt, mod A for underwear and pants.  toileting mod A  - she is able to complete hygiene but requires some assist for pants down and up.  Oral care set up seated at sink, dependent for hair care (unable to tolerate brushing out knots in back of head)  Returned to bed so that nursing could scan bladder, completed bilateral UE shoulder/scapular NMRE in supine position.  She remained in bed at close of session, bed alarm set and call bell in reach.   Session 2:   Patient in bed, ready for session,  Bed mobility with CS, sit to stand & ambulation without AD in room to/from bed, w/c, toilet, arm chair with CGA.  kinesiotape applied to promote scapular positioning/support humeral head, facilitate scapular retraction and depression, also facilitation of wrist extensors - tolerated well during session - provided education regarding wear and care.  Completed seated and standing proximal control  activities with lateral lean, trunk rotation with focus on right scapular mobility, posture, balance.  Completed left UE AROM/contitioning activities, provided foam sponge for left hand flexion and extension activities - she is able to recall at end of session.  toileting completed with CS for hygiene, mod A pants down/up.  She sat in arm chair at close of session - husband present, seat belt alarm set, tray table in reach.      Therapy Documentation Precautions:  Precautions Precautions: Fall Precaution Comments: NPO Restrictions Weight Bearing Restrictions: No  Therapy/Group: Individual Therapy  Carlos Levering 02/11/2020, 7:46 AM

## 2020-02-11 NOTE — Plan of Care (Signed)
  Problem: RH BLADDER ELIMINATION Goal: RH STG MANAGE BLADDER WITH ASSISTANCE Description: STG Manage Bladder With min Assistance Outcome: Not Progressing; in and out cath    

## 2020-02-11 NOTE — Progress Notes (Signed)
Physical Therapy Session Note  Patient Details  Name: Erika Stanton MRN: [id number] Date of Birth: 05/25/1955  Today's Date: 02/11/2020 PT Individual Time: 1515-1559 PT Individual Time Calculation (min): 44 min   Short Term Goals: Week 4:  PT Short Term Goal 1 (Week 4): Pt will complete bed<>w/c consistently with CGA & LRAD. PT Short Term Goal 2 (Week 4): Pt will ambulate 50 ft with LRAD & CGA. PT Short Term Goal 3 (Week 4): Pt will negotiate 8 steps with LUE support & mod assist.  Skilled Therapeutic Interventions/Progress Updates:     Pt received supine in bed and agrees to therapy. No complaint of pain. Supine to sit with supervision and cues for positioning. Stand step transfer to RW with minA and no AD. WC transport outside for time management. Pt ambulates outside for gait training over varying levels and surfaces in open environment for increased challenge. Pt ambulates bouts of 50', 100', 100', 110', and 110', without AD. PT provides minA at hips for stability and to facilitate lateral weight shifting and progression of LLE. PT cues for upright gaze to improve posture and balance and pt correctly verbalizes need for wider BOS with L stride length and width. PT encourages pt to sit up following session but pt insistent on going back to bed. Stand step transfer back to bed with minA. Return to supine mod(I) with bed features. Left supine in bed with alarm intact and all needs within reach.  Therapy Documentation Precautions:  Precautions Precautions: Fall Precaution Comments: NPO Restrictions Weight Bearing Restrictions: No    Therapy/Group: Individual Therapy  Breck Coons, PT, DPT 02/11/2020, 4:07 PM

## 2020-02-12 ENCOUNTER — Inpatient Hospital Stay (HOSPITAL_COMMUNITY): Payer: BC Managed Care – PPO | Admitting: Speech Pathology

## 2020-02-12 ENCOUNTER — Inpatient Hospital Stay (HOSPITAL_COMMUNITY): Payer: BC Managed Care – PPO | Admitting: Physical Therapy

## 2020-02-12 ENCOUNTER — Inpatient Hospital Stay (HOSPITAL_COMMUNITY): Payer: BC Managed Care – PPO | Admitting: Occupational Therapy

## 2020-02-12 LAB — BASIC METABOLIC PANEL
Anion gap: 14 (ref 5–15)
BUN: <5 mg/dL — ABNORMAL LOW (ref 8–23)
CO2: 25 mmol/L (ref 22–32)
Calcium: 9.2 mg/dL (ref 8.9–10.3)
Chloride: 102 mmol/L (ref 98–111)
Creatinine, Ser: 0.56 mg/dL (ref 0.44–1.00)
GFR calc Af Amer: >60 mL/min (ref >60)
GFR calc non Af Amer: >60 mL/min (ref >60)
Glucose, Bld: 115 mg/dL — ABNORMAL HIGH (ref 70–99)
Potassium: 4 mmol/L (ref 3.5–5.1)
Sodium: 141 mmol/L (ref 135–145)

## 2020-02-12 NOTE — Progress Notes (Signed)
Speech Language Pathology Weekly Progress and Session Note  Patient Details  Name: Erika Stanton MRN: [id number] Date of Birth: Oct 14, 1955  Beginning of progress report period: February 05, 2020 End of progress report period: February 12, 2020  Today's Date: 02/12/2020 SLP Individual Time: 1275-1700 SLP Individual Time Calculation (min): 60 min  Short Term Goals: Week 3: SLP Short Term Goal 1 (Week 3): Patient will consume current diet with minimal overt s/s of aspiration and overall Min A verbal cues for use of swallowing compensatory strategies. SLP Short Term Goal 1 - Progress (Week 3): Met SLP Short Term Goal 2 (Week 3): Patient will demonstrate efficient mastication with trials of Dys. 2 textures without overt s/s of aspiration with Min A verbal cues over 2 sessions prior to upgrade. SLP Short Term Goal 2 - Progress (Week 3): Met SLP Short Term Goal 3 (Week 3): Patient will demonstrate sustained attention to functional tasks for 10 minutes with Mod verbal cues for redirection. SLP Short Term Goal 3 - Progress (Week 3): Met SLP Short Term Goal 4 (Week 3): Patient will demonstrate functional problem solving for basic and familiar tasks with Mod A verbal cues. SLP Short Term Goal 4 - Progress (Week 3): Met SLP Short Term Goal 5 (Week 3): Patient will recall new, daily information with Mod A verbal cues for use of external aids. SLP Short Term Goal 5 - Progress (Week 3): Met    New Short Term Goals: Week 4: SLP Short Term Goal 1 (Week 4): Patient will consume current diet with minimal overt s/s of aspiration and overall Mod I for use of swallowing compensatory strategies. SLP Short Term Goal 2 (Week 4): Patient will demonstrate efficient mastication with trials of Dys. 3 textures without overt s/s of aspiration with Min A verbal cues over 2 sessions prior to upgrade. SLP Short Term Goal 3 (Week 4): Patient will demonstrate sustained attention to functional tasks for 30 minutes with Min  verbal cues for redirection. SLP Short Term Goal 4 (Week 4): Patient will demonstrate functional problem solving for basic and familiar tasks with Min A verbal cues. SLP Short Term Goal 5 (Week 4): Patient will recall new, daily information with Min A verbal cues for use of external aids.  Weekly Progress Updates: Patient was made functional gains and has met 5 of 5 STGs this reporting period. Currently, patient is consuming Dys. 2 textures with thin liquids without overt s/s of aspiration and supervision verbal cues for for use of swallowing compensatory strategies. This upcoming week will focus on trials of Dys. 3 textures. Patient also requires overall Mod A verbal cues for sustained attention, functional problem solving, recall of functional information and awareness in order to complete functional and familiar tasks safely. Patient is overall 100% intelligible at the sentence level with supervision verbal cues. Patient and family education ongoing. Patient would benefit from continued skilled SLP intervention to maximize her cognitive and swallowing function prior to discharge.      Intensity: Minumum of 1-2 x/day, 30 to 90 minutes Frequency: 3 to 5 out of 7 days Duration/Length of Stay: 02/23/20 Treatment/Interventions: Cognitive remediation/compensation;Dysphagia/aspiration precaution training;Internal/external aids;Speech/Language facilitation;Therapeutic Activities;Environmental controls;Cueing hierarchy;Functional tasks;Patient/family education   Daily Session  Skilled Therapeutic Interventions: Skilled treatment session focused on cognitive and dysphagia goals. Patient consumed breakfast meal of Dys. 2 textures with thin liquids with overt cough X 1, suspect due to laughing and drinking at the same time. and recommend patient continue current diet.  Supervision verbal cues were needed  for use of swallowing compensatory strategies, recommend patient continue full supervision. Patient  continues to be intermittently verbose requiring Mod verbal cues for redirection. However, only Min A verbal cues were needed for problem solving with self-feeding. Patient left upright in the recliner with alarm on and all needs within reach. Continue with current plan of care.     Pain No/Denies Pain  Therapy/Group: Individual Therapy  Jaedin Regina 02/12/2020, 6:21 AM

## 2020-02-12 NOTE — Progress Notes (Signed)
[place] PHYSICAL MEDICINE & REHABILITATION PROGRESS NOTE   Subjective/Complaints: Up with SLP. No new complaints. Ate 75% this morning. Likes to drink a lot.  ROS: Patient denies fever, rash, sore throat, blurred vision, nausea, vomiting, diarrhea, cough, shortness of breath or chest pain,  headache, or mood change.      Objective:   No results found. No results for input(s): WBC, HGB, HCT, PLT in the last 72 hours. Recent Labs    02/12/20 0624  NA 141  K 4.0  CL 102  CO2 25  GLUCOSE 115*  BUN <5*  CREATININE 0.56  CALCIUM 9.2    Intake/Output Summary (Last 24 hours) at 02/12/2020 1046 Last data filed at 02/12/2020 0900 Gross per 24 hour  Intake 1068 ml  Output 1400 ml  Net -332 ml     Physical Exam: Vital Signs Blood pressure 111/63, pulse (!) 101, temperature 98.9 F (37.2 C), resp. rate 17, height 5\' 6"  (1.676 m), weight 89.5 kg, SpO2 94 %.  Constitutional: No distress . Vital signs reviewed. HEENT:   oral membranes moist Neck: supple Cardiovascular: RRR without murmur. No JVD    Respiratory/Chest: CTA Bilaterally without wheezes or rales. Normal effort    GI/Abdomen: BS +, non-tender, non-distended Ext: no clubbing, cyanosis, or edema Psych: pleasant and cooperative Neurologic:LUQ-anopsia. EOMI. Good speech quality. Improved insight and awareness.  Trace to 2+ bicep right UE, otherwise tr to 0/5 RUE, RLE 0/5, LUE 2+/5 , LLE 4/5. Right arm remains sensitive to touch withdecreased LT discrimination Musculoskeletal: Right arm still tender with ROM . No joint swelling Skin: bruises slowly resolving   Assessment/Plan: 1. 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  Physiatrist is providing close team supervision and 24 hour management of active medical problems listed below.  Physiatrist and rehab team continue to assess  barriers to discharge/monitor patient progress toward functional and medical goals  Care Tool:  Bathing    Body parts bathed by patient: Front perineal area, Abdomen, Left upper leg, Right upper leg, Chest, Right lower leg, Face, Left lower leg   Body parts bathed by helper: Right arm, Left arm, Buttocks, Left lower leg     Bathing assist Assist Level: Moderate Assistance - Patient 50 - 74%     Upper Body Dressing/Undressing Upper body dressing   What is the patient wearing?: Pull over shirt    Upper body assist Assist Level: Moderate Assistance - Patient 50 - 74%    Lower Body Dressing/Undressing Lower body dressing      What is the patient wearing?: Underwear/pull up, Pants     Lower body assist Assist for lower body dressing: Moderate Assistance - Patient 50 - 74%     Toileting Toileting    Toileting assist Assist for toileting: Moderate Assistance - Patient 50 - 74%     Transfers Chair/bed transfer  Transfers assist     Chair/bed transfer assist level: Minimal Assistance - Patient > 75%     Locomotion Ambulation   Ambulation assist      Assist level: Minimal Assistance - Patient > 75% Assistive device: No Device Max distance: 110'   Walk 10 feet activity   Assist     Assist level: Minimal Assistance - Patient > 75% Assistive device: No Device   Walk 50 feet activity   Assist Walk 50 feet with 2 turns activity did not occur: Safety/medical concerns  Assist  level: Minimal Assistance - Patient > 75% Assistive device: No Device    Walk 150 feet activity   Assist Walk 150 feet activity did not occur: Safety/medical concerns         Walk 10 feet on uneven surface  activity   Assist Walk 10 feet on uneven surfaces activity did not occur: Safety/medical concerns         Wheelchair     Assist Will patient use wheelchair at discharge?: Yes Type of Wheelchair: Manual           Wheelchair 50 feet with 2 turns  activity    Assist    Wheelchair 50 feet with 2 turns activity did not occur: Safety/medical concerns (requires TIS for postural support)       Wheelchair 150 feet activity     Assist  Wheelchair 150 feet activity did not occur: Safety/medical concerns       Blood pressure 111/63, pulse (!) 101, temperature 98.9 F (37.2 C), resp. rate 17, height 5\' 6"  (1.676 m), weight 89.5 kg, SpO2 94 %.  Medical Problem List and Plan: 1.  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  Continue CIR, PT, OT, SLP     -making progressive in all areas  -husband to be involved with education, dispo planning 2.  Antithrombotics: -DVT/anticoagulation:  Pharmaceutical: Heparin--hold                          Dopplers negative for DVT.              -antiplatelet therapy: DAPT--resumed plavix, asa without problems   3. Pain Management: Tylenol prn.    -neuropathic pain RUE  -10/1 right arm much improved. Continue same gabapentin 4. Mood: LCSW to follow for evaluation and support when appropriate.              -antipsychotic agents: N/A 5. Neuropsych: This patient is not capable of making decisions on her own behalf.  Continue trial of ritalin  for concentration and arousal    -continue with 5mg  dose for now   -arousal and mentation much improved 6. Skin/Wound Care: routine pressure relief measures. May d/c IV 7. Fluids/Electrolytes/Nutrition:   -on D2/thins, ate 50-75% over last 3 meals!   -husband says she's a picky eater   -asked him to bring food from home she likes   -I personally reviewed the patient's labs today which were WNL 8. ABLA/UGIB:     EGD 9/15 apparently demonstrated non-bleeding esophagitis/gastritis, HH   -husband doesn't want to pursue further testing  9/17: resumed plavix/asa. Will not resume sq heparin   10/1 continue  protonix   - most recent hgb holding at 11.7 9/27   -no bleeding   -check cbc Monday      9. 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 Resting tachycardia:              has been well controlled.  12. Vasospasm prevention: nimotop completed 13. Bowel incontinence/loose stool: multifactorial, on abx for uti also  -fiber/probiotic  -stools formed, had bm 10/1 14. Proteus UTI/enteroccus UTI: Ceftriaxone course completed for proteus  -amoxil x 5 days for enterococcus  completed 9/24 18.  Prediabetes,    Relatively controlled. May d/c CBGs           LOS: 23 days A FACE TO FACE EVALUATION  WAS PERFORMED  Meredith Staggers 02/12/2020, 10:46 AM

## 2020-02-12 NOTE — Progress Notes (Signed)
Physical Therapy Session Note  Patient Details  Name: Erika Stanton MRN: [id number] Date of Birth: January 07, 1956  Today's Date: 02/12/2020 PT Individual Time: 9211-9417 PT Individual Time Calculation (min): 72 min   Short Term Goals: Week 4:  PT Short Term Goal 1 (Week 4): Pt will complete bed<>w/c consistently with CGA & LRAD. PT Short Term Goal 2 (Week 4): Pt will ambulate 50 ft with LRAD & CGA. PT Short Term Goal 3 (Week 4): Pt will negotiate 8 steps with LUE support & mod assist.  Skilled Therapeutic Interventions/Progress Updates:  Pt received in recliner & agreeable to tx. Sit<>stand throughout session with min assist. Gait in room/bathroom without AD & min assist. Pt completes toilet transfer with min assist with PT assisting with clothing management. Pt with continent void & BM on toilet with extra time, pt requires max assist for posterior peri hygiene. Pt completes hand hygiene standing at sink with assist to wash LUE. In gym, pt completes Berg Balance Test & scores 28/56; educated pt on current fall risk & interpretation of score. Patient demonstrates increased fall risk as noted by score of 28/56 on Berg Balance Scale.  (<36= high risk for falls, close to 100%; 37-45 significant >80%; 46-51 moderate >50%; 52-55 lower >25%). Pt requires frequent seated rest breaks throughout testing 2/2 fatigue. During Berg balance test, pt demonstrates poor insight into deficits as she reports she's unsure which foot/leg is weaker. Pt performs standing cone taps with min assist with task focusing on dynamic balance, weight shifting L<>R and BLE NMR. PT provides education re: normal stance (pt stands with LLE towards midline), need to weight shift R during standing cone taps & gait. Pt ambulates dayroom>room without AD & min assist with increasing L lateral lean as task progressed. Back in room, PT assists pt with changing clothes per pt request. At end of session pt left in recliner with chair alarm donned,  call bell in reach,    Therapy Documentation Precautions:  Precautions Precautions: Fall Restrictions Weight Bearing Restrictions: No  Pain: C/o unrated pain with RUE movement - repositioning provided for increased comfort  Balance: Balance Balance Assessed: Yes Standardized Balance Assessment Standardized Balance Assessment: Berg Balance Test Berg Balance Test Sit to Stand: Needs minimal aid to stand or to stabilize Standing Unsupported: Able to stand 2 minutes with supervision (with encouragement to continue as pt c/o fatigue during task) Sitting with Back Unsupported but Feet Supported on Floor or Stool: Able to sit safely and securely 2 minutes Stand to Sit: Controls descent by using hands Transfers: Able to transfer with verbal cueing and /or supervision Standing Unsupported with Eyes Closed: Able to stand 10 seconds with supervision Standing Ubsupported with Feet Together: Able to place feet together independently and stand for 1 minute with supervision From Standing, Reach Forward with Outstretched Arm: Reaches forward but needs supervision From Standing Position, Pick up Object from Floor: Able to pick up shoe, needs supervision From Standing Position, Turn to Look Behind Over each Shoulder: Needs supervision when turning Turn 360 Degrees: Needs close supervision or verbal cueing Standing Unsupported, Alternately Place Feet on Step/Stool: Able to complete >2 steps/needs minimal assist Standing Unsupported, One Foot in Front: Able to take small step independently and hold 30 seconds Standing on One Leg: Unable to try or needs assist to prevent fall Total Score: 28    Therapy/Group: Individual Therapy  [name] 02/12/2020, 11:04 AM

## 2020-02-12 NOTE — Progress Notes (Signed)
Occupational Therapy Session Note  Patient Details  Name: Erika Stanton MRN: [id number] Date of Birth: 08/24/1955  Today's Date: 02/12/2020 OT Individual Time: 1025-8527 OT Individual Time Calculation (min): 58 min   Short Term Goals: Week 3:  OT Short Term Goal 1 (Week 3): Pt will be able to use LUE to wash R arm, torso and LEs with CGA. OT Short Term Goal 2 (Week 3): Pt will use LUE to pull  a shirt on and off over her head. OT Short Term Goal 3 (Week 3): Pt will be able to tolerate self ROM to RUE with min A.  Skilled Therapeutic Interventions/Progress Updates:    Pt greeted in the recliner, declining shower or toileting. Agreeable to start session with gentle self ROM of the Rt hand for contracture prevention. Pt needing HOH assistance for carryover of ROM technique due to cognitive deficits, education provided regarding joint protection. Pt with active elbow flexion on the Rt side however exhibits sublux in the Rt shoulder. Mod A for sit<stand from low recliner, Min HHA on the Rt side to ambulate to the sink. Pt engaged in hand washing, face washing, and oral care while standing. Used the mirror to increase her postural awareness due to Lt lean, manual cues to improve midline. Tried to facilitate Rt hand weightbearing on the sink however pt with increased pain when arm was pronated. HOH to use the Rt hand as a gross stabilizer as needed during tasks. When she returned to the recliner after, she engaged in an activity while sitting unsupported involving anterior/lateral reaches to target. Focus at this time was placed on trunk strengthening and Lt UE NMR. Min A for ambulating across the room to throw away her face wipe and then for returning to bed. Pt transferred to bed and was left with all needs within reach and bed alarm set.    Therapy Documentation Precautions:  Precautions Precautions: Fall Precaution Comments: NPO Restrictions Weight Bearing Restrictions: No Vital Signs: Therapy  Vitals Temp: 97.9 F (36.6 C) Pulse Rate: 88 Resp: 14 BP: 115/78 Patient Position (if appropriate): Sitting Oxygen Therapy SpO2: 97 % O2 Device: Room Air Pain: in the Rt hand, RN in during session to provide pain medicine Pain Assessment Pain Scale: 0-10 Pain Score: 4  Pain Type: Acute pain Pain Location: Hand Pain Descriptors / Indicators: Aching Pain Frequency: Intermittent Pain Onset: On-going Pain Intervention(s): Medication (See eMAR) ADL: ADL Eating: NPO Grooming: Dependent Where Assessed-Grooming: Bed level Upper Body Bathing: Maximal assistance Where Assessed-Upper Body Bathing: Shower Lower Body Bathing: Maximal assistance Where Assessed-Lower Body Bathing: Shower Upper Body Dressing: Maximal assistance Where Assessed-Upper Body Dressing: Wheelchair Lower Body Dressing: Maximal assistance Where Assessed-Lower Body Dressing: Wheelchair, Standing at sink, Sitting at sink Toileting: Dependent Where Assessed-Toileting: Bed level Social research officer, government: Minimal assistance Social research officer, government Method: Stand pivot ADL Comments: patient able to hold wash cloth with left hand but unable to manipulate for washing, she is able to lift each leg for donning pants/socks      Therapy/Group: Individual Therapy  Trygg Mantz A [name] 02/12/2020, 4:33 PM

## 2020-02-13 ENCOUNTER — Inpatient Hospital Stay (HOSPITAL_COMMUNITY): Payer: BC Managed Care – PPO

## 2020-02-13 ENCOUNTER — Inpatient Hospital Stay (HOSPITAL_COMMUNITY): Payer: BC Managed Care – PPO | Admitting: Physical Therapy

## 2020-02-13 NOTE — Progress Notes (Signed)
[facility] PHYSICAL MEDICINE & REHABILITATION PROGRESS NOTE   Subjective/Complaints: No complaints this morning. Denies pain, constipation, insomnia.   ROS: Patient denies fever, rash, sore throat, blurred vision, nausea, vomiting, diarrhea, cough, shortness of breath or chest pain,  headache, or mood change.    Objective:   No results found. No results for input(s): WBC, HGB, HCT, PLT in the last 72 hours. Recent Labs    02/12/20 0624  NA 141  K 4.0  CL 102  CO2 25  GLUCOSE 115*  BUN <5*  CREATININE 0.56  CALCIUM 9.2    Intake/Output Summary (Last 24 hours) at 02/13/2020 1653 Last data filed at 02/13/2020 1300 Gross per 24 hour  Intake 740 ml  Output 0 ml  Net 740 ml     Physical Exam: Vital Signs Blood pressure 109/72, pulse 88, temperature 97.8 F (36.6 C), resp. rate 18, height 5\' 6"  (1.676 m), weight 89.5 kg, SpO2 100 %.  General: Alert and oriented x 3, No apparent distress HEENT: Head is normocephalic, atraumatic, PERRLA, EOMI, sclera anicteric, oral mucosa pink and moist, dentition intact, ext ear canals clear,  Neck: Supple without JVD or lymphadenopathy Heart: Reg rate and rhythm. No murmurs rubs or gallops Chest: CTA bilaterally without wheezes, rales, or rhonchi; no distress Abdomen: Soft, non-tender, non-distended, bowel sounds positive. Psych: pleasant and cooperative Neurologic:LUQ-anopsia. EOMI. Good speech quality. Improved insight and awareness.  Trace to 2+ bicep right UE, otherwise tr to 0/5 RUE, RLE 0/5, LUE 2+/5 , LLE 4/5. Right arm remains sensitive to touch withdecreased LT discrimination Musculoskeletal: Right arm still tender with ROM . No joint swelling Skin: bruises slowly resolving    Assessment/Plan: 1. 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  Physiatrist is providing close team supervision and 24  hour management of active medical problems listed below.  Physiatrist and rehab team continue to assess barriers to discharge/monitor patient progress toward functional and medical goals  Care Tool:  Bathing    Body parts bathed by patient: Front perineal area, Abdomen, Left upper leg, Right upper leg, Chest, Right lower leg, Face, Left lower leg   Body parts bathed by helper: Right arm, Left arm, Buttocks, Left lower leg     Bathing assist Assist Level: Moderate Assistance - Patient 50 - 74%     Upper Body Dressing/Undressing Upper body dressing   What is the patient wearing?: Pull over shirt    Upper body assist Assist Level: Moderate Assistance - Patient 50 - 74%    Lower Body Dressing/Undressing Lower body dressing      What is the patient wearing?: Underwear/pull up, Pants     Lower body assist Assist for lower body dressing: Moderate Assistance - Patient 50 - 74%     Toileting Toileting    Toileting assist Assist for toileting: Moderate Assistance - Patient 50 - 74%     Transfers Chair/bed transfer  Transfers assist     Chair/bed transfer assist level: Minimal Assistance - Patient > 75%     Locomotion Ambulation   Ambulation assist      Assist level: Minimal Assistance - Patient > 75% Assistive device: No Device Max distance: 75   Walk 10 feet activity   Assist     Assist level: Minimal Assistance - Patient > 75% Assistive device: No Device   Walk 50 feet activity   Assist Walk 50 feet with 2 turns  activity did not occur: Safety/medical concerns  Assist level: Minimal Assistance - Patient > 75% Assistive device: No Device    Walk 150 feet activity   Assist Walk 150 feet activity did not occur: Safety/medical concerns         Walk 10 feet on uneven surface  activity   Assist Walk 10 feet on uneven surfaces activity did not occur: Safety/medical concerns         Wheelchair     Assist Will patient use wheelchair  at discharge?: Yes Type of Wheelchair: Manual           Wheelchair 50 feet with 2 turns activity    Assist    Wheelchair 50 feet with 2 turns activity did not occur: Safety/medical concerns (requires TIS for postural support)       Wheelchair 150 feet activity     Assist  Wheelchair 150 feet activity did not occur: Safety/medical concerns   Assist Level: Supervision/Verbal cueing   Blood pressure 109/72, pulse 88, temperature 97.8 F (36.6 C), resp. rate 18, height 5\' 6"  (1.676 m), weight 89.5 kg, SpO2 100 %.  Medical Problem List and Plan: 1.  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  Continue CIR, PT, OT, SLP     -making progressive in all areas  -husband to be involved with education, dispo planning 2.  Antithrombotics: -DVT/anticoagulation:  Pharmaceutical: Heparin--hold                          Dopplers negative for DVT.              -antiplatelet therapy: DAPT--resumed plavix, asa without problems   3. Pain Management: Tylenol prn.    -neuropathic pain RUE  -10/1 right arm much improved. Continue same gabapentin  Well controlled 4. Mood: LCSW to follow for evaluation and support when appropriate.              -antipsychotic agents: N/A 5. Neuropsych: This patient is not capable of making decisions on her own behalf.  Continue trial of ritalin  for concentration and arousal    -continue with 5mg  dose for now   -arousal and mentation much improved 6. Skin/Wound Care: routine pressure relief measures. May d/c IV 7. Fluids/Electrolytes/Nutrition:   -on D2/thins, ate 50-75% over last 3 meals!   -husband says she's a picky eater   -asked him to bring food from home she likes   -I personally reviewed the patient's labs today which were WNL 8. ABLA/UGIB:     EGD 9/15 apparently demonstrated non-bleeding  esophagitis/gastritis, HH   -husband doesn't want to pursue further testing  9/17: resumed plavix/asa. Will not resume sq heparin   10/1 continue protonix   - most recent hgb holding at 11.7 9/27   -no bleeding   -check cbc Monday   9. 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 Resting tachycardia:              well controlled 12. Vasospasm prevention: nimotop completed 13. Bowel incontinence/loose stool: multifactorial, on abx for uti also  -fiber/probiotic  -stools formed, had bm 10/1 14. Proteus UTI/enteroccus UTI: Ceftriaxone course completed for proteus  -amoxil x 5 days for enterococcus  completed 9/24 18.  Prediabetes,    Relatively controlled. May d/c CBGs           LOS: 24  days A FACE TO FACE EVALUATION WAS PERFORMED  Erika Stanton 02/13/2020, 4:53 PM

## 2020-02-13 NOTE — Progress Notes (Signed)
Physical Therapy Session Note  Patient Details  Name: Erika Stanton MRN: [id number] Date of Birth: 1955/09/16  Today's Date: 02/13/2020 PT Individual Time: 0800-0857 PT Individual Time Calculation (min): 57 min   Short Term Goals: Week 1:  PT Short Term Goal 1 (Week 1): Pt will demonstrate static sit consistently w/min assist PT Short Term Goal 1 - Progress (Week 1): Not met PT Short Term Goal 2 (Week 1): pt will perform STS w/mod assist of 2 PT Short Term Goal 2 - Progress (Week 1): Not met PT Short Term Goal 3 (Week 1): Pt will ambulate 56f w/LRAD and max assist of 2 PT Short Term Goal 3 - Progress (Week 1): Not met PT Short Term Goal 4 (Week 1): Pt will tolerate passive cervical rotation to R 20* PT Short Term Goal 4 - Progress (Week 1): Not met PT Short Term Goal 5 (Week 1): Pt will tolerate 1-2 hrs OOB in wc PT Short Term Goal 5 - Progress (Week 1): Met  Skilled Therapeutic Interventions/Progress Updates:  Pt was seen bedside in the am. Pt transferred supine to edge of bed with side rail, head of bed elevated and S with increased time. Pt maintained balance on edge of bed with S. Pt transferred edge of bed to w/c with min A ad verbal cues. Pt transported to rehab gym. In gym pt performed multiple sit to stand and stand pivot transfers with min A and verbal cues. Pt performed cone taps and alternating cone taps, 3 sets x 10 reps each for NMR. Pt ambulated 75 feet x 2 without assistive device and min A with verbal cues. Pt ambulated 75 feet with SBQC and min A with verbal cues. Pt ambulated 75 feet with st cane and c/g to min A with verbal cues. Pt returned to room. Pt transferred w/c to recliner with min A and verbal cues. Pt left sitting up in recliner with chair alarm on and call bell within reach.   Therapy Documentation Precautions:  Precautions Precautions: Fall Precaution Comments: NPO Restrictions Weight Bearing Restrictions: No General:   Pain: Pt c/o R hand  pain.  Therapy/Group: Individual Therapy  MDub Amis10/06/2019, 12:08 PM

## 2020-02-13 NOTE — Progress Notes (Signed)
Occupational Therapy Session Note  Patient Details  Name: Erika Stanton MRN: [id number] Date of Birth: 04/03/1956  Today's Date: 02/13/2020 OT Individual Time: 9021-1155 OT Individual Time Calculation (min): 45 min    Short Term Goals: Week 1:  OT Short Term Goal 1 (Week 1): patient will roll in bed with min A, move from side lying to sitting edge of bed with mod A of one OT Short Term Goal 1 - Progress (Week 1): Met OT Short Term Goal 2 (Week 1): patient will tolerate unsupported sitting with min A for 10 minutes with head midline OT Short Term Goal 2 - Progress (Week 1): Met OT Short Term Goal 3 (Week 1): patient will use left UE to wash 25% of upper body and assist with donning [place] shirt max A OT Short Term Goal 3 - Progress (Week 1): Progressing toward goal OT Short Term Goal 4 (Week 1): patient will scan environment both right and left to locate items with moderate cues OT Short Term Goal 4 - Progress (Week 1): Met  Skilled Therapeutic Interventions/Progress Updates:    1:1. Pt received in recliner, agreeable to OT. Pt wanting to change clothing. Pt completes doffing UB pullover shirt with MOD A and dons pull over shirt with MOD A both to manage RUE and pull over head. Pt requires max VC for hemi dressing strategies. Pt provided with LB clothing and is able to doff pants with MIN A to power up to standing and balance while managing pants pas thips. Pt able ot thread Ble and VC for figure 4 strategy. Pt able to pull pants up hips 90% when turning LUE palm facing away  from body to get up R hip. Grooming at sink [place] A to incorporate RUE into toas for NMR. K tape reapplied to R shoulder and wrist to decrease risk of sublux and approximate glenohumeral joint/ encourage wrist extension. Exited sessionw iht pt seated in recliner, exit alarm on and call light in reach  Therapy Documentation Precautions:  Precautions Precautions: Fall Precaution Comments: NPO Restrictions Weight Bearing  Restrictions: No General:   Vital Signs:   Pain:   ADL: ADL Eating: NPO Grooming: Dependent Where Assessed-Grooming: Bed level Upper Body Bathing: Maximal assistance Where Assessed-Upper Body Bathing: Shower Lower Body Bathing: Maximal assistance Where Assessed-Lower Body Bathing: Shower Upper Body Dressing: Maximal assistance Where Assessed-Upper Body Dressing: Wheelchair Lower Body Dressing: Maximal assistance Where Assessed-Lower Body Dressing: Wheelchair, Standing at sink, Sitting at sink Toileting: Dependent Where Assessed-Toileting: Bed level Social research officer, government: Minimal assistance Social research officer, government Method: Stand pivot ADL Comments: patient able to hold wash cloth with left hand but unable to manipulate for washing, she is able to lift each leg for donning pants/socks Vision   Perception    Praxis   Exercises:   Other Treatments:     Therapy/Group: Individual Therapy  Tonny Branch 02/13/2020, 9:46 AM

## 2020-02-13 NOTE — Progress Notes (Signed)
Speech Language Pathology Daily Session Note  Patient Details  Name: Erika Stanton MRN: [id number] Date of Birth: 1955-12-24  Today's Date: 02/13/2020 SLP Individual Time: 3903-0092 SLP Individual Time Calculation (min): 43 min  Short Term Goals: Week 4: SLP Short Term Goal 1 (Week 4): Patient will consume current diet with minimal overt s/s of aspiration and overall Mod I for use of swallowing compensatory strategies. SLP Short Term Goal 2 (Week 4): Patient will demonstrate efficient mastication with trials of Dys. 3 textures without overt s/s of aspiration with Min A verbal cues over 2 sessions prior to upgrade. SLP Short Term Goal 3 (Week 4): Patient will demonstrate sustained attention to functional tasks for 30 minutes with Min verbal cues for redirection. SLP Short Term Goal 4 (Week 4): Patient will demonstrate functional problem solving for basic and familiar tasks with Min A verbal cues. SLP Short Term Goal 5 (Week 4): Patient will recall new, daily information with Min A verbal cues for use of external aids.  Skilled Therapeutic Interventions:Skilled ST services focused on education and cognitive skills. SLP facilitated recall and problem solving skills in novel card task. Pt demonstrated recall of three rules and problem solving skills, when card task played at basic level, requiring supervision A fade to mod I. In mildly complex card task level, pt required extra time and max A fade to mod A verbal cues for problem solving. Pt demonstrated sustained attention in 30 minute interval mod I, however selective attention was impaired. Pt's husband entered room the last 10 minutes of treatment session. SLP provided education pertaining to cognitive and swallow progress. All questions answered to satisfaction. Pt was left in room with husband, call bell within reach and chair alarm set. SLP recommends to continue skilled services.     Pain Pain Assessment Pain Scale: 0-10 Pain Score: 0-No  pain Faces Pain Scale: Hurts whole lot Pain Type: Acute pain Pain Location: Hand Pain Orientation: Anterior Pain Descriptors / Indicators: Aching Pain Frequency: Intermittent Pain Onset: On-going Patients Stated Pain Goal: 0 Pain Intervention(s): Medication (See eMAR)  Therapy/Group: Individual Therapy  Chrishana Spargur  [facility] 02/13/2020, 1:05 PM

## 2020-02-14 ENCOUNTER — Encounter (HOSPITAL_COMMUNITY): Payer: BC Managed Care – PPO | Admitting: Occupational Therapy

## 2020-02-14 NOTE — Progress Notes (Signed)
[facility] PHYSICAL MEDICINE & REHABILITATION PROGRESS NOTE   Subjective/Complaints: Had some headache this morning and Tylenol helped it. Asks what she can do for right shoulder subluxation/arm weakness.  Moving bowels regularly   ROS: Patient denies fever, rash, sore throat, blurred vision, nausea, vomiting, diarrhea, cough, shortness of breath or chest pain,  headache, or mood change.    Objective:   No results found. No results for input(s): WBC, HGB, HCT, PLT in the last 72 hours. Recent Labs    02/12/20 0624  NA 141  K 4.0  CL 102  CO2 25  GLUCOSE 115*  BUN <5*  CREATININE 0.56  CALCIUM 9.2    Intake/Output Summary (Last 24 hours) at 02/14/2020 1216 Last data filed at 02/14/2020 0823 Gross per 24 hour  Intake 617 ml  Output --  Net 617 ml     Physical Exam: Vital Signs Blood pressure 101/72, pulse 89, temperature 98.6 F (37 C), resp. rate 16, height 5\' 6"  (1.676 m), weight 89.5 kg, SpO2 94 %.  General: Alert and oriented x 3, No apparent distress HEENT: Head is normocephalic, atraumatic, PERRLA, EOMI, sclera anicteric, oral mucosa pink and moist, dentition intact, ext ear canals clear,  Neck: Supple without JVD or lymphadenopathy Heart: Reg rate and rhythm. No murmurs rubs or gallops Chest: CTA bilaterally without wheezes, rales, or rhonchi; no distress Abdomen: Soft, non-tender, non-distended, bowel sounds positive. Neurologic:LUQ-anopsia. EOMI. Good speech quality. Improved insight and awareness.  Trace to 2+ bicep right UE, otherwise tr to 0/5 RUE, RLE 0/5, LUE 2+/5 , LLE 4/5. Right arm remains sensitive to touch withdecreased LT discrimination Musculoskeletal: Right arm still tender with ROM . No joint swelling Skin: bruises slowly resolving     Assessment/Plan: 1. 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  Physiatrist  is providing close team supervision and 24 hour management of active medical problems listed below.  Physiatrist and rehab team continue to assess barriers to discharge/monitor patient progress toward functional and medical goals  Care Tool:  Bathing    Body parts bathed by patient: Front perineal area, Abdomen, Left upper leg, Right upper leg, Chest, Right lower leg, Face, Left lower leg   Body parts bathed by helper: Right arm, Left arm, Buttocks, Left lower leg     Bathing assist Assist Level: Moderate Assistance - Patient 50 - 74%     Upper Body Dressing/Undressing Upper body dressing   What is the patient wearing?: Pull over shirt    Upper body assist Assist Level: Moderate Assistance - Patient 50 - 74%    Lower Body Dressing/Undressing Lower body dressing      What is the patient wearing?: Underwear/pull up, Pants     Lower body assist Assist for lower body dressing: Moderate Assistance - Patient 50 - 74%     Toileting Toileting    Toileting assist Assist for toileting: Moderate Assistance - Patient 50 - 74%     Transfers Chair/bed transfer  Transfers assist     Chair/bed transfer assist level: Minimal Assistance - Patient > 75%     Locomotion Ambulation   Ambulation assist      Assist level: Minimal Assistance - Patient > 75% Assistive device: No Device Max distance: 75   Walk 10 feet activity   Assist     Assist level: Minimal Assistance - Patient > 75% Assistive device: No Device   Walk 50  feet activity   Assist Walk 50 feet with 2 turns activity did not occur: Safety/medical concerns  Assist level: Minimal Assistance - Patient > 75% Assistive device: No Device    Walk 150 feet activity   Assist Walk 150 feet activity did not occur: Safety/medical concerns         Walk 10 feet on uneven surface  activity   Assist Walk 10 feet on uneven surfaces activity did not occur: Safety/medical concerns          Wheelchair     Assist Will patient use wheelchair at discharge?: Yes Type of Wheelchair: Manual           Wheelchair 50 feet with 2 turns activity    Assist    Wheelchair 50 feet with 2 turns activity did not occur: Safety/medical concerns (requires TIS for postural support)       Wheelchair 150 feet activity     Assist  Wheelchair 150 feet activity did not occur: Safety/medical concerns   Assist Level: Supervision/Verbal cueing   Blood pressure 101/72, pulse 89, temperature 98.6 F (37 C), resp. rate 16, height 5\' 6"  (1.676 m), weight 89.5 kg, SpO2 94 %.  Medical Problem List and Plan: 1.  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  Continue CIR, PT, OT, SLP     -making progressive in all areas  -husband to be involved with education, dispo planning 2.  Antithrombotics: -DVT/anticoagulation:  Pharmaceutical: Heparin--hold                          Dopplers negative for DVT.              -antiplatelet therapy: DAPT--resumed plavix, asa without problems   3. Pain Management: Tylenol prn.    -neuropathic pain RUE  -10/1 right arm much improved. Continue same gabapentin  Right arm subluxation- discussed pros and cons of sling, lidocaine patch,TENS, Sprint PNS, prolotherapy in outpatient setting 4. Mood: LCSW to follow for evaluation and support when appropriate.              -antipsychotic agents: N/A 5. Neuropsych: This patient is not capable of making decisions on her own behalf.  Continue trial of ritalin  for concentration and arousal    -continue with 5mg  dose for now   -arousal and mentation much improved 6. Skin/Wound Care: routine pressure relief measures. May d/c IV 7. Fluids/Electrolytes/Nutrition:   -on D2/thins, ate 50-75% over last 3 meals!   -husband says she's a picky eater   -asked him to bring  food from home she likes   -I personally reviewed the patient's labs today which were WNL 8. ABLA/UGIB:     EGD 9/15 apparently demonstrated non-bleeding esophagitis/gastritis, HH   -husband doesn't want to pursue further testing  9/17: resumed plavix/asa. Will not resume sq heparin   10/1 continue protonix   - most recent hgb holding at 11.7 9/27   -no bleeding   -check cbc Monday   9. 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 Resting tachycardia:              well controlled 12. Vasospasm prevention: nimotop completed 13. Bowel incontinence/loose stool: multifactorial, on abx for uti also  -fiber/probiotic  -stools formed, had bm 10/1 14. Proteus UTI/enteroccus UTI: Ceftriaxone course completed for proteus  -amoxil x 5 days for enterococcus  completed 9/24 18.  Prediabetes,    Relatively controlled. May d/c CBGs           LOS: 25 days A FACE TO FACE EVALUATION WAS PERFORMED  Martha Clan P Lukka Black 02/14/2020, 12:16 PM

## 2020-02-14 NOTE — Progress Notes (Signed)
Occupational Therapy Session Note  Patient Details  Name: Erika Stanton MRN: [id number] Date of Birth: 01-Feb-1956  Today's Date: 02/14/2020 OT Group Time: 1100-1200 OT Group Time Calculation (min): 60 min  Skilled Therapeutic Interventions/Progress Updates:    Pt engaged in therapeutic w/c level dance group focusing on patient choice, UE/LE strengthening, salience, activity tolerance, and social participation. Pt was guided through various dance-based exercises involving UEs/LEs and trunk. All music was selected by group members. Emphasis placed on UE NMR and trunk control. Kept her Rt elbow supported on pillow or with therapist assistance for sublux safety. Worked on gentle self ROM of hand and also elbow flexion/extension with active assist. She declined standing but sat unsupported during several songs when rotating and laterally swaying trunk in beat to music with CGA from OT. At end of session pts spouse escorted her back to the room.    Therapy Documentation Precautions:  Precautions Precautions: Fall Precaution Comments: NPO Restrictions Weight Bearing Restrictions: No Vital Signs: Therapy Vitals Temp: 97.7 F (36.5 C) Pulse Rate: 85 Resp: 16 BP: 111/75 Patient Position (if appropriate): Lying Oxygen Therapy SpO2: 100 % O2 Device: Room Air Pain:   ADL: ADL Eating: NPO Grooming: Dependent Where Assessed-Grooming: Bed level Upper Body Bathing: Maximal assistance Where Assessed-Upper Body Bathing: Shower Lower Body Bathing: Maximal assistance Where Assessed-Lower Body Bathing: Shower Upper Body Dressing: Maximal assistance Where Assessed-Upper Body Dressing: Wheelchair Lower Body Dressing: Maximal assistance Where Assessed-Lower Body Dressing: Wheelchair, Standing at sink, Sitting at sink Toileting: Dependent Where Assessed-Toileting: Bed level Social research officer, government: Minimal assistance Social research officer, government Method: Stand pivot ADL Comments: patient able to  hold wash cloth with left hand but unable to manipulate for washing, she is able to lift each leg for donning pants/socks      Therapy/Group: Group Therapy  Drue Camera A Ruthene Methvin 02/14/2020, 4:20 PM

## 2020-02-15 ENCOUNTER — Inpatient Hospital Stay (HOSPITAL_COMMUNITY): Payer: BC Managed Care – PPO | Admitting: Speech Pathology

## 2020-02-15 ENCOUNTER — Inpatient Hospital Stay (HOSPITAL_COMMUNITY): Payer: BC Managed Care – PPO | Admitting: Physical Therapy

## 2020-02-15 ENCOUNTER — Inpatient Hospital Stay (HOSPITAL_COMMUNITY): Payer: BC Managed Care – PPO | Admitting: Occupational Therapy

## 2020-02-15 ENCOUNTER — Encounter (HOSPITAL_COMMUNITY): Payer: BC Managed Care – PPO | Admitting: Psychology

## 2020-02-15 LAB — CBC
HCT: 38.6 % (ref 36.0–46.0)
Hemoglobin: 12.1 g/dL (ref 12.0–15.0)
MCH: 30.3 pg (ref 26.0–34.0)
MCHC: 31.3 g/dL (ref 30.0–36.0)
MCV: 96.7 fL (ref 80.0–100.0)
Platelets: 337 10*3/uL (ref 150–400)
RBC: 3.99 MIL/uL (ref 3.87–5.11)
RDW: 12.6 % (ref 11.5–15.5)
WBC: 5.9 10*3/uL (ref 4.0–10.5)
nRBC: 0 % (ref 0.0–0.2)

## 2020-02-15 LAB — BASIC METABOLIC PANEL
Anion gap: 11 (ref 5–15)
BUN: 6 mg/dL — ABNORMAL LOW (ref 8–23)
CO2: 23 mmol/L (ref 22–32)
Calcium: 9.2 mg/dL (ref 8.9–10.3)
Chloride: 101 mmol/L (ref 98–111)
Creatinine, Ser: 0.6 mg/dL (ref 0.44–1.00)
GFR calc Af Amer: >60 mL/min (ref >60)
GFR calc non Af Amer: >60 mL/min (ref >60)
Glucose, Bld: 120 mg/dL — ABNORMAL HIGH (ref 70–99)
Potassium: 4.2 mmol/L (ref 3.5–5.1)
Sodium: 135 mmol/L (ref 135–145)

## 2020-02-15 MED ORDER — METHYLPHENIDATE HCL 5 MG PO TABS
5.0000 mg | ORAL_TABLET | Freq: Every day | ORAL | Status: AC
  Administered 2020-02-17 – 2020-02-18 (×2): 5 mg via NASOGASTRIC
  Filled 2020-02-15 (×3): qty 1

## 2020-02-15 NOTE — Plan of Care (Signed)
  Problem: Consults Goal: RH BRAIN INJURY PATIENT EDUCATION Description: Description: See Patient Education module for eduction specifics Outcome: Progressing Goal: Skin Care Protocol Initiated - if Braden Score 18 or less Description: If consults are not indicated, leave blank or document N/A Outcome: Progressing Goal: Nutrition Consult-if indicated Outcome: Progressing Goal: Diabetes Guidelines if Diabetic/Glucose > 140 Description: If diabetic or lab glucose is > 140 mg/dl - Initiate Diabetes/Hyperglycemia Guidelines & Document Interventions  Outcome: Progressing   Problem: RH BOWEL ELIMINATION Goal: RH STG MANAGE BOWEL WITH ASSISTANCE Description: STG Manage Bowel with min Assistance. Outcome: Progressing Goal: RH STG MANAGE BOWEL W/MEDICATION W/ASSISTANCE Description: STG Manage Bowel with Medication with min Assistance. Outcome: Progressing   Problem: RH BLADDER ELIMINATION Goal: RH STG MANAGE BLADDER WITH ASSISTANCE Description: STG Manage Bladder With min Assistance Outcome: Progressing Goal: RH STG MANAGE BLADDER WITH MEDICATION WITH ASSISTANCE Description: STG Manage Bladder With Medication With min Assistance. Outcome: Progressing   Problem: RH SKIN INTEGRITY Goal: RH STG SKIN FREE OF INFECTION/BREAKDOWN Description: Skin to remain free from infection and breakdown with min assist while on CIR. Outcome: Progressing Goal: RH STG MAINTAIN SKIN INTEGRITY WITH ASSISTANCE Description: STG Maintain Skin Integrity With min Assistance. Outcome: Progressing Goal: RH STG ABLE TO PERFORM INCISION/WOUND CARE W/ASSISTANCE Description: STG Able To Perform Incision/Wound Care With min Assistance. Outcome: Progressing   Problem: RH SAFETY Goal: RH STG ADHERE TO SAFETY PRECAUTIONS W/ASSISTANCE/DEVICE Description: STG Adhere to Safety Precautions With min Assistance and appropriate assistive Device. Outcome: Progressing   Problem: RH COGNITION-NURSING Goal: RH STG USES MEMORY  AIDS/STRATEGIES W/ASSIST TO PROBLEM SOLVE Description: STG Uses Memory Aids/Strategies With min Assistance to Problem Solve. Outcome: Progressing Goal: RH STG ANTICIPATES NEEDS/CALLS FOR ASSIST W/ASSIST/CUES Description: STG Anticipates Needs/Calls for Assist With min Assistance/Cues. Outcome: Progressing   Problem: RH PAIN MANAGEMENT Goal: RH STG PAIN MANAGED AT OR BELOW PT'S PAIN GOAL Description: <3 on a 0-10 pain scale. Outcome: Progressing   Problem: RH KNOWLEDGE DEFICIT BRAIN INJURY Goal: RH STG INCREASE KNOWLEDGE OF SELF CARE AFTER BRAIN INJURY Description: Patient will demonstrate knowledge of medication management, diabetes management, dietary management, and follow up care with the MD post discharge with min assist from [facility] staff. Outcome: Progressing Goal: RH STG INCREASE KNOWLEDGE OF DYSPHAGIA/FLUID INTAKE Description: Patient will be able to demonstrate knowledge of dysphagia diets, thickened liquids, and swallowing precautions with min assist from rehab staff. Outcome: Progressing   Problem: RH Vision Goal: RH LTG Vision (Specify) Outcome: Progressing

## 2020-02-15 NOTE — Consult Note (Signed)
Neuropsychological Consultation   Patient:   Erika Stanton   DOB:   May 08, 1956  MR Number:  [id number]  Location:  [place] [facility] [place] [id number] [place] [id number] Dept: [phone] [facility]: [phone]           Date of Service:   02/15/2020  Start Time:   9:30 End Time:   10:30   Provider/Observer:  [name], Psy.D.       Clinical Neuropsychologist       Billing Code/Service: [id number]  Chief Complaint:    Erika Stanton. Shetterly is a 64 year old female who was in relatively good health.  Patient admitted on 01/05/2020 with right-sided headache and left hemiparesis.  CT head showed right basal ganglia hemorrhage with small amount of subarachnoid hemorrhage and bilateral IVH.  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  Patient underwent stent supported embolization of right ICA aneurysm on 01/06/2020.  Postop with lethargy.  EEG showed evidence of epileptogenicity arising from right temporal region as well as severe diffuse encephalopathy with no seizures.  Patient developed obtundation and follow-up MRI done revealing acute cortical and subcortical infarct affecting the left anterior frontal and left parietal regions as well as punctuate micro emboli in the right frontal and parietal regions.  Patient has significant initial cognitive deficits with disorientation, unintelligible speech and bouts of lethargy.  Patient has made significant improvements and is continuing to show improvements during her CIR admission.  Reason for Service:  Patient was referred for neuropsychological consultation due to residual effects of her CVA and coping issues.  Below is the HPI for the current admission.  HPI:  Erika Stanton. Grinage is a 64 year old female in relatively good health who was admitted on 01/05/2020 with right-sided headache and left  hemiparesis.  History taken from chart review, nursing, and sister due to mentation.  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  CTA head showed ruptured giant aneurysm of R-ICA terminus, 6 mm aneurysm L-ICA terminua and 56mm A-Comm aneurysm. Dr. Kathyrn Sheriff consulted and patient underwent stent supported embolization of R-ICA aneurysm on 01/06/2020.  Post op with lethargy and maintained on nimotop and keppra. EEG done showed evidence of epileptogenicity arising from right temporal region as well as severe diffuse encephalopathy but no seizures. On ASA/Plavix due to stent.   She developed obtundation and follow up MRI brain done revealing development of acute cortical and subcortical infarct affecting left anterior frontal and left parietal cortex as well as punctate microemboli in right frontal and parietal regions. Slight increase in vasogenic edema noted without hydrocephalus. LT-EEG showed left cortical dysfunction felt to be due to underlying stroke- no seizures and Keppra was dced on 01/17/2020.  Nimotop added due to concerns of vasospasms and tube feeds started for nutritional support. As mentation improved, patient noted to have flaccid RUE with decrease in verbal output. She remains NPO due to signs of aspiration--MBS pending, has cognitive deficits with disorientation, unintelligible speech and bouts of lethargy. Therapy ongoing and patient required assistance to advance LLE for pregait activity. CIR recommended due to functional decline.  Please see preadmission assessment from earlier today as well.  Current Status:  Upon entering the room, the patient was seated in her wheelchair and was alert with husband present.  The patient was able to give a generally accurate  summation of what it happened to her but had a lot of questions about why she had the aneurysm etc.  She was oriented to person place and situation.  Information processing speed was somewhat  slowed but generally close to within normal limits.  The patient had some weakness with regard to attention and had some distractibility primarily to internal preoccupations.  Patient's husband also had a lot of questions about expectations going forward.  The patient's memory appeared to be intact and she was able to talk about in reference recent events during inpatient rehabilitative efforts.  Expressive language and receptive language appear to be intact.  Patient continues to have motor deficits primarily with the right arm and weakness with right leg.  Patient denied any significant depressive or anxiety based symptomatology but did have ongoing concerns about the second aneurysm they found that they are monitoring and what to expect going forward as far as recovery.  Behavioral Observation: Erika Stanton  presents as a 64 y.o.-year-old Right Caucasian Female who appeared her stated age. her dress was Appropriate and she was Well Groomed and her manners were Appropriate to the situation.  her participation was indicative of Appropriate and Redirectable behaviors.  There were any physical disabilities noted.  she displayed an appropriate level of cooperation and motivation.     Interactions:    Active Appropriate and Redirectable  Attention:   abnormal and attention span appeared shorter than expected for age  Memory:   within normal limits; recent and remote memory intact  Visuo-spatial:  not examined but both the patient and her husband describe some issues with visual processing.  Speech (Volume):  low  Speech:   normal; normal  Thought Process:  Coherent and Relevant  Though Content:  WNL; not suicidal and not homicidal  Orientation:   person, place and situation  Judgment:   Fair  Planning:   Poor  Affect:    Appropriate  Mood:    Euthymic  Insight:   Fair  Intelligence:   High  Medical History:  History reviewed. No pertinent past medical history.   Psychiatric  History:  No prior psychiatric history.  Family Med/Psych History:  Family History  Problem Relation Age of Onset  . Cancer Mother   . Congestive Heart Failure Mother   . Lung cancer Father   . Lung cancer Sister     Impression/DX:  Elan Mcelvain. Lona is a 64 year old female who was in relatively good health.  Patient admitted on 01/05/2020 with right-sided headache and left hemiparesis.  CT head showed right basal ganglia hemorrhage with small amount of subarachnoid hemorrhage and bilateral IVH.  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  Patient underwent stent supported embolization of right ICA aneurysm on 01/06/2020.  Postop with lethargy.  EEG showed evidence of epileptogenicity arising from right temporal region as well as severe diffuse encephalopathy with no seizures.  Patient developed obtundation and follow-up MRI done revealing acute cortical and subcortical infarct affecting the left anterior frontal and left parietal regions as well as punctuate micro emboli in the right frontal and parietal regions.  Patient has significant initial cognitive deficits with disorientation, unintelligible speech and bouts of lethargy.  Patient has made significant improvements and is continuing to show improvements during her CIR admission.  Upon entering the room, the patient was seated in her wheelchair and was alert with husband present.  The patient was able  to give a generally accurate summation of what it happened to her but had a lot of questions about why she had the aneurysm etc.  She was oriented to person place and situation.  Information processing speed was somewhat slowed but generally close to within normal limits.  The patient had some weakness with regard to attention and had some distractibility primarily to internal preoccupations.  Patient's husband also had a lot of questions about expectations going forward.  The  patient's memory appeared to be intact and she was able to talk about in reference recent events during inpatient rehabilitative efforts.  Expressive language and receptive language appear to be intact.  Patient continues to have motor deficits primarily with the right arm and weakness with right leg.  Patient denied any significant depressive or anxiety based symptomatology but did have ongoing concerns about the second aneurysm they found that they are monitoring and what to expect going forward as far as recovery.  Disposition/Plan:  Today we worked on coping and adjustment issues following her CVA.  There were numerous questions asked by both the patient and her husband regarding expected course going forward and details about what it happened to her with regard to her aneurysm and subsequent stroke events.  Patient will be followed up upon discharge our outpatient program and I suspect we may end up doing neuropsych testing down the road.  At this point the patient is showing significant changes on if not a daily basis weekly basis and therefore it is too early to do complete neuropsychological testing.  I will be available for follow-up.  I will follow up with the patient next week on the unit and will follow up with the patient outpatient as well.          Electronically Signed   _______________________ [name], Psy.D.

## 2020-02-15 NOTE — Progress Notes (Signed)
Occupational Therapy Session Note  Patient Details  Name: Erika Stanton MRN: [id number] Date of Birth: [birth year]  Today's Date: 02/15/2020 OT Individual Time: 1045-1200 OT Individual Time Calculation (min): 75 min    Short Term Goals: Week 3:  OT Short Term Goal 1 (Week 3): Pt will be able to use LUE to wash R arm, torso and LEs with CGA. OT Short Term Goal 2 (Week 3): Pt will use LUE to pull  a shirt on and off over her head. OT Short Term Goal 3 (Week 3): Pt will be able to tolerate self ROM to RUE with min A.  Skilled Therapeutic Interventions/Progress Updates:    Patient seated in recliner, she notes intermittent pain in right UE.  Husband present for family education session today.  Sit to stand and ambulation without AD to/from recliner, arm chair, toilet, shower bench, w/c CGA (occ min A to stand from lower surfaces)  toileting mod A for pants down/up, able to complete hygiene with set up.  Bathing seated on shower bench with hand held shower mod A for bilateral UEs and buttocks.  Dressing completed seated on arm chair - bra mod A, OH shirt mod A with increased time and cues for technique, pants/underwear min A, CM in stance mod A.  Able to slid feet into slip on shoes.  Hair care dependent for washing/drying/combing (fair tolerance for combing)   Able to stand at sink to complete oral care with set up and CS for balance.  Husband demonstrates good understanding but will benefit from ongoing education.   Completed right proximal motor control, posture, inhibition techniques, weight bearing activities with good results.  She returned to recliner at close of session, seat belt alarm set and call bell in reach.    Therapy Documentation Precautions:  Precautions Precautions: Fall Precaution Comments: NPO Restrictions Weight Bearing Restrictions: No   Therapy/Group: Individual Therapy  Carlos Levering 02/15/2020, 7:41 AM

## 2020-02-15 NOTE — Progress Notes (Signed)
Physical Therapy Session Note  Patient Details  Name: Erika Stanton MRN: [id number] Date of Birth: March 11, 1956  Today's Date: 02/15/2020 PT Individual Time: 9038-3338 PT Individual Time Calculation (min): 56 min   Short Term Goals: Week 4:  PT Short Term Goal 1 (Week 4): Pt will complete bed<>w/c consistently with CGA & LRAD. PT Short Term Goal 2 (Week 4): Pt will ambulate 50 ft with LRAD & CGA. PT Short Term Goal 3 (Week 4): Pt will negotiate 8 steps with LUE support & mod assist.  Skilled Therapeutic Interventions/Progress Updates:  Pt received in recliner with husband present for caregiver training. PT educates husband Ronalee Belts) on how to manage chair alarm & he assist pt via ambulation without AD into the bathroom with PT providing cuing re: caregiver positioning, technique & overall safety for pt with pt & Ronalee Belts able to manage clothing & pt with continent void on toilet. Ronalee Belts assist's pt with hand hygiene. PT provided pt with RW & R hand orthosis educating her on benefits for increased balance & RUE support; PT also educates Ronalee Belts on pt's current fall risk & Berg Balance Score. Educated pt on RUE management on hand orthosis & provided assist PRN throughout session. Gait in room, then gait room<>dayroom and around nurses station with RW & CGA with PT providing cuing for midline orientation vs L lateral lean, increased step width LLE, and safety with AD. Ronalee Belts return demonstrates ability to assist pt with gait with RW & gait belt but continues to require cuing for hand placement, caregiver positioning. Pt negotiates 4 steps with L rail ascending, but holds to PT when descending with min assist overall with pt & husband return demonstrating. Educated pt & husband on recommendation for use of RW at all times at home upon d/c, as well as transport w/c for community use. At end of session pt returns to bed & is left with call bell in reach, husband present in room.   Therapy Documentation Precautions:   Precautions Precautions: Fall Precaution Comments: NPO Restrictions Weight Bearing Restrictions: No \\Pain : No c/o pain reported   Therapy/Group: Individual Therapy  [name] 02/15/2020, 3:57 PM

## 2020-02-15 NOTE — Progress Notes (Signed)
Speech Language Pathology Daily Session Note  Patient Details  Name: Erika Stanton MRN: [id number] Date of Birth: [birth year]  Today's Date: 02/15/2020 SLP Individual Time: 0830-0930 SLP Individual Time Calculation (min): 60 min  Short Term Goals: Week 4: SLP Short Term Goal 1 (Week 4): Patient will consume current diet with minimal overt s/s of aspiration and overall Mod I for use of swallowing compensatory strategies. SLP Short Term Goal 2 (Week 4): Patient will demonstrate efficient mastication with trials of Dys. 3 textures without overt s/s of aspiration with Min A verbal cues over 2 sessions prior to upgrade. SLP Short Term Goal 3 (Week 4): Patient will demonstrate sustained attention to functional tasks for 30 minutes with Min verbal cues for redirection. SLP Short Term Goal 4 (Week 4): Patient will demonstrate functional problem solving for basic and familiar tasks with Min A verbal cues. SLP Short Term Goal 5 (Week 4): Patient will recall new, daily information with Min A verbal cues for use of external aids.  Skilled Therapeutic Interventions: Skilled treatment session focused on dysphagia and cognitive goals. SLP facilitated session by providing skilled observation with pills whole in applesauce. Patient without overt s/s of aspiration but reported 2 pills felt "stuck" in her throat. Sensation cleared with pills were followed by a spoonful of Dys. 1 textures. Recommend patient take pills whole in puree as long as she is in an upright position. Patient's husband arrived with 15 minutes left of session. SLP provided education regarding patient's current swallowing function, diet recommendations, appropriate textures and compensatory strategies. He verbalized understanding but will need reinforcement. Patient required overall Min A verbal cues for alternating attention between self-feeding of her breakfast meal and participating in a functional conversation regarding discharge planning.  Patient left upright in the recliner with husband present and all needs within reach. Continue with current plan of care.      Pain No/Denies Pain   Therapy/Group: Individual Therapy  Mayumi Summerson 02/15/2020, 2:59 PM

## 2020-02-15 NOTE — Progress Notes (Signed)
[facility] PHYSICAL MEDICINE & REHABILITATION PROGRESS NOTE   Subjective/Complaints: Up with SLP. No major issues this weekend. Eating better. Excited about upgrade to D3 diet!  ROS: Patient denies fever, rash, sore throat, blurred vision, nausea, vomiting, diarrhea, cough, shortness of breath or chest pain, joint or back pain, headache, or mood change. .    Objective:   No results found. No results for input(s): WBC, HGB, HCT, PLT in the last 72 hours. No results for input(s): NA, K, CL, CO2, GLUCOSE, BUN, CREATININE, CALCIUM in the last 72 hours.  Intake/Output Summary (Last 24 hours) at 02/15/2020 1008 Last data filed at 02/14/2020 1753 Gross per 24 hour  Intake 236 ml  Output --  Net 236 ml     Physical Exam: Vital Signs Blood pressure 106/72, pulse 86, temperature 98.5 F (36.9 C), resp. rate 18, height 5\' 6"  (1.676 m), weight 89.5 kg, SpO2 95 %.  Constitutional: No distress . Vital signs reviewed. HEENT: EOMI, oral membranes moist Neck: supple Cardiovascular: RRR without murmur. No JVD    Respiratory/Chest: CTA Bilaterally without wheezes or rales. Normal effort    GI/Abdomen: BS +, non-tender, non-distended Ext: no clubbing, cyanosis, or edema Psych: pleasant and cooperative Neurologic:LUQ-anopsia. EOMI. Good speech quality. Improved insight and awareness. Left CN VII.    2+ bicep, triceps/ 1+ WE right UE> , LUE 2+/5 , LLE 4/5. Right arm is less sensitive to touch with decreased LT. Musculoskeletal: Right arm still tender with ROM . No joint swelling Skin: bruises slowly resolving     Assessment/Plan: 1. 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  Physiatrist is providing close team supervision and 24 hour management of active medical problems listed below.  Physiatrist and rehab team continue to assess barriers to discharge/monitor  patient progress toward functional and medical goals  Care Tool:  Bathing    Body parts bathed by patient: Front perineal area, Abdomen, Left upper leg, Right upper leg, Chest, Right lower leg, Face, Left lower leg   Body parts bathed by helper: Right arm, Left arm, Buttocks, Left lower leg     Bathing assist Assist Level: Moderate Assistance - Patient 50 - 74%     Upper Body Dressing/Undressing Upper body dressing   What is the patient wearing?: Pull over shirt    Upper body assist Assist Level: Moderate Assistance - Patient 50 - 74%    Lower Body Dressing/Undressing Lower body dressing      What is the patient wearing?: Underwear/pull up, Pants     Lower body assist Assist for lower body dressing: Moderate Assistance - Patient 50 - 74%     Toileting Toileting    Toileting assist Assist for toileting: Moderate Assistance - Patient 50 - 74%     Transfers Chair/bed transfer  Transfers assist     Chair/bed transfer assist level: Minimal Assistance - Patient > 75%     Locomotion Ambulation   Ambulation assist      Assist level: Minimal Assistance - Patient > 75% Assistive device: No Device Max distance: 75   Walk 10 feet activity   Assist     Assist level: Minimal Assistance - Patient > 75% Assistive device: No Device   Walk 50 feet activity   Assist Walk 50 feet with 2 turns activity did not occur: Safety/medical concerns  Assist level: Minimal Assistance - Patient > 75% Assistive device: No Device  Walk 150 feet activity   Assist Walk 150 feet activity did not occur: Safety/medical concerns         Walk 10 feet on uneven surface  activity   Assist Walk 10 feet on uneven surfaces activity did not occur: Safety/medical concerns         Wheelchair     Assist Will patient use wheelchair at discharge?: Yes Type of Wheelchair: Manual           Wheelchair 50 feet with 2 turns activity    Assist    Wheelchair  50 feet with 2 turns activity did not occur: Safety/medical concerns (requires TIS for postural support)       Wheelchair 150 feet activity     Assist  Wheelchair 150 feet activity did not occur: Safety/medical concerns   Assist Level: Supervision/Verbal cueing   Blood pressure 106/72, pulse 86, temperature 98.5 F (36.9 C), resp. rate 18, height 5\' 6"  (1.676 m), weight 89.5 kg, SpO2 95 %.  Medical Problem List and Plan: 1.  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  Continue CIR, PT, OT, SLP     -making progressive in all areas  -husband involved education, dispo planning 2.  Antithrombotics: -DVT/anticoagulation:  Pharmaceutical: Heparin--hold                          Dopplers negative for DVT.              -antiplatelet therapy: DAPT--resumed plavix, asa without problems   3. Pain Management: Tylenol prn.    -neuropathic pain RUE   -10/1 right arm much improved. Continue same gabapentin  Right arm subluxation- continue support with pillow/lap tray. She's getting return in the RUE which is encouraging.  4. Mood: LCSW to follow for evaluation and support when appropriate.              -antipsychotic agents: N/A 5. Neuropsych: This patient is not capable of making decisions on her own behalf.  -arousal and initiation much better. Continue ritalin daily for 3 days then stop.  6. Skin/Wound Care: routine pressure relief measures. May d/c IV 7. Fluids/Electrolytes/Nutrition:   -on D2/thins, eating 50-75%   -advance to D3 will only help 8. ABLA/UGIB:     EGD 9/15 apparently demonstrated non-bleeding esophagitis/gastritis, HH   -husband doesn't want to pursue further testing  9/17: resumed plavix/asa. Will not resume sq heparin   10/1 continue protonix   - most recent hgb holding at 11.7 9/27   -no bleeding   -  cbc Monday    pending 9. 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 Resting tachycardia:              well controlled 12. Vasospasm prevention: nimotop completed 13. Bowel incontinence/loose stool:   -fiber/probiotic  -stools now formed  14. Proteus UTI/enteroccus UTI: Ceftriaxone course completed for proteus  -amoxil x 5 days for enterococcus  completed 9/24 18.  Prediabetes,    Relatively controlled. dc'ed  CBG checks          LOS: 26 days A FACE TO FACE EVALUATION WAS PERFORMED  Meredith Staggers 02/15/2020, 10:08 AM

## 2020-02-16 ENCOUNTER — Inpatient Hospital Stay (HOSPITAL_COMMUNITY): Payer: BC Managed Care – PPO | Admitting: Speech Pathology

## 2020-02-16 ENCOUNTER — Inpatient Hospital Stay (HOSPITAL_COMMUNITY): Payer: BC Managed Care – PPO | Admitting: Physical Therapy

## 2020-02-16 ENCOUNTER — Inpatient Hospital Stay (HOSPITAL_COMMUNITY): Payer: BC Managed Care – PPO | Admitting: Occupational Therapy

## 2020-02-16 NOTE — Progress Notes (Signed)
Speech Language Pathology Daily Session Note  Patient Details  Name: KHANIYA TENAGLIA MRN: [id number] Date of Birth: [birth year]  Today's Date: 02/16/2020 SLP Individual Time: 1100-1155 SLP Individual Time Calculation (min): 55 min  Short Term Goals: Week 4: SLP Short Term Goal 1 (Week 4): Patient will consume current diet with minimal overt s/s of aspiration and overall Mod I for use of swallowing compensatory strategies. SLP Short Term Goal 2 (Week 4): Patient will demonstrate efficient mastication with trials of Dys. 3 textures without overt s/s of aspiration with Min A verbal cues over 2 sessions prior to upgrade. SLP Short Term Goal 3 (Week 4): Patient will demonstrate sustained attention to functional tasks for 30 minutes with Min verbal cues for redirection. SLP Short Term Goal 4 (Week 4): Patient will demonstrate functional problem solving for basic and familiar tasks with Min A verbal cues. SLP Short Term Goal 5 (Week 4): Patient will recall new, daily information with Min A verbal cues for use of external aids.  Skilled Therapeutic Interventions: Skilled treatment session focused on dysphagia goals and ongoing family education. SLP facilitated session by providing skilled observation with trial tray of Dys. 3 textures with thin liquids. Patient consumed meal with efficient mastication and complete oral clearance, therefore, recommend patient upgrade to Dys. 3 textures. Patient's husband present and asked questions about how to appropriately perform the Heimlich maneuver, SLP provided education. SLP also provided education regarding the importance of 24 hour supervision, thinking about friends/family who can assist at home and how to maximize overall safety at home. He verbalized understanding. Patient transferred back to bed at end of session. Patient left supine in bed with alarm on and all needs within reach. Continue with current plan of care.      Pain Pain Assessment Pain Scale:  0-10 Pain Score: 3  Faces Pain Scale: Hurts a little bit Pain Type: Acute pain Pain Location: Hand Pain Orientation: Right Pain Descriptors / Indicators: Aching Pain Frequency: Occasional Pain Onset: On-going Patients Stated Pain Goal: 0 Pain Intervention(s): Medication (See eMAR) Multiple Pain Sites: Yes (Head)  Therapy/Group: Individual Therapy  Lailyn Appelbaum 02/16/2020, 12:33 PM

## 2020-02-16 NOTE — Progress Notes (Signed)
[facility] PHYSICAL MEDICINE & REHABILITATION PROGRESS NOTE   Subjective/Complaints: Up with SLP. No major issues this weekend. Eating better. Excited about upgrade to D3 diet!  ROS: Patient denies fever, rash, sore throat, blurred vision, nausea, vomiting, diarrhea, cough, shortness of breath or chest pain, joint or back pain, headache, or mood change. .    Objective:   No results found. Recent Labs    02/15/20 1037  WBC 5.9  HGB 12.1  HCT 38.6  PLT 337   Recent Labs    02/15/20 1037  NA 135  K 4.2  CL 101  CO2 23  GLUCOSE 120*  BUN 6*  CREATININE 0.60  CALCIUM 9.2    Intake/Output Summary (Last 24 hours) at 02/16/2020 1245 Last data filed at 02/16/2020 1140 Gross per 24 hour  Intake 673 ml  Output --  Net 673 ml     Physical Exam: Vital Signs Blood pressure 111/65, pulse 83, temperature 97.8 F (36.6 C), resp. rate 18, height 5\' 6"  (1.676 m), weight 89.5 kg, SpO2 94 %.  Constitutional: No distress . Vital signs reviewed. HEENT: EOMI, oral membranes moist Neck: supple Cardiovascular: RRR without murmur. No JVD    Respiratory/Chest: CTA Bilaterally without wheezes or rales. Normal effort    GI/Abdomen: BS +, non-tender, non-distended Ext: no clubbing, cyanosis, or edema Psych: pleasant and cooperative Neurologic:LUQ-anopsia. EOMI. Good speech quality. Improved insight and awareness. Left CN VII.    2+ bicep, triceps/ 1+ WE right UE> , LUE 2+/5 , LLE 4/5. Right arm is less sensitive to touch with decreased LT. Musculoskeletal: Right arm still tender with ROM . No joint swelling Skin: bruises slowly resolving     Assessment/Plan: 1. 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  Physiatrist is providing close team supervision and 24 hour management of active medical problems listed below.  Physiatrist and rehab team continue to  assess barriers to discharge/monitor patient progress toward functional and medical goals  Care Tool:  Bathing    Body parts bathed by patient: Front perineal area, Abdomen, Left upper leg, Right upper leg, Chest, Right lower leg, Face, Left lower leg   Body parts bathed by helper: Right arm, Left arm, Buttocks, Left lower leg     Bathing assist Assist Level: Moderate Assistance - Patient 50 - 74%     Upper Body Dressing/Undressing Upper body dressing   What is the patient wearing?: Pull over shirt, Bra    Upper body assist Assist Level: Moderate Assistance - Patient 50 - 74%    Lower Body Dressing/Undressing Lower body dressing      What is the patient wearing?: Underwear/pull up, Pants     Lower body assist Assist for lower body dressing: Minimal Assistance - Patient > 75%     Toileting Toileting    Toileting assist Assist for toileting: Moderate Assistance - Patient 50 - 74%     Transfers Chair/bed transfer  Transfers assist     Chair/bed transfer assist level: Minimal Assistance - Patient > 75%     Locomotion Ambulation   Ambulation assist      Assist level: Contact Guard/Touching assist Assistive device: Walker-rolling Max distance: 100 ft   Walk 10 feet activity   Assist     Assist level: Contact Guard/Touching assist Assistive device: Walker-rolling   Walk 50 feet activity   Assist Walk 50 feet with 2 turns activity did not occur:  Safety/medical concerns  Assist level: Contact Guard/Touching assist Assistive device: Walker-rolling    Walk 150 feet activity   Assist Walk 150 feet activity did not occur: Safety/medical concerns         Walk 10 feet on uneven surface  activity   Assist Walk 10 feet on uneven surfaces activity did not occur: Safety/medical concerns         Wheelchair     Assist Will patient use wheelchair at discharge?: Yes Type of Wheelchair: Manual           Wheelchair 50 feet with 2 turns  activity    Assist    Wheelchair 50 feet with 2 turns activity did not occur: Safety/medical concerns (requires TIS for postural support)       Wheelchair 150 feet activity     Assist  Wheelchair 150 feet activity did not occur: Safety/medical concerns   Assist Level: Supervision/Verbal cueing   Blood pressure 111/65, pulse 83, temperature 97.8 F (36.6 C), resp. rate 18, height 5\' 6"  (1.676 m), weight 89.5 kg, SpO2 94 %.  Medical Problem List and Plan: 1.  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  Continue CIR, PT, OT, SLP     -making progressive in all areas  -husband receiving education, dispo planning 2.  Antithrombotics: -DVT/anticoagulation:  Pharmaceutical: Heparin--hold                          Dopplers negative for DVT.              -antiplatelet therapy: DAPT--resumed plavix, asa without problems   3. Pain Management: Tylenol prn.    -neuropathic pain RUE   -10/1 right arm much improved. Continue same gabapentin  Right arm subluxation- continue support with pillow/lap tray. She's getting return in the RUE which is encouraging.  4. Mood: LCSW to follow for evaluation and support when appropriate.              -antipsychotic agents: N/A 5. Neuropsych: This patient is not capable of making decisions on her own behalf.  -arousal and initiation much better. Continue ritalin daily Friday then stop.  6. Skin/Wound Care: routine pressure relief measures.   7. Fluids/Electrolytes/Nutrition:   -on D3/thins, eating 50-75%+     8. ABLA/UGIB:     EGD 9/15 apparently demonstrated non-bleeding esophagitis/gastritis, HH   -husband doesn't want to pursue further testing  9/17: resumed plavix/asa. Will not resume sq heparin   10/1 continue protonix   - most recent hgb holding at 11.7 9/27   -no bleeding   -hgb stable/increased  10/4 12.1 9. 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 Resting tachycardia:              well controlled 12. Vasospasm prevention: nimotop completed 13. Bowel incontinence/loose stool:   -fiber/probiotic  -stools now formed  14. Proteus UTI/enteroccus UTI: Ceftriaxone course completed for proteus  -amoxil x 5 days for enterococcus  completed 9/24 18.  Prediabetes,    Relatively controlled. dc'ed  CBG checks          LOS: 27 days A FACE TO FACE EVALUATION WAS PERFORMED  Meredith Staggers 02/16/2020, 12:45 PM

## 2020-02-16 NOTE — Progress Notes (Signed)
Physical Therapy Weekly Progress Note  Patient Details  Name: Erika Stanton MRN: [id number] Date of Birth: [birth year]  Beginning of progress report period: February 10, 2020 End of progress report period: February 16, 2020  Today's Date: 02/16/2020   Patient has met 2 of 3 short term goals.  Pt is making good progress towards LTGs. Pt has initiated gait training with RW & R hand orthosis with CGA and hands on caregiver training has been started. Pt requires min assist to ascend stairs with L ascending rail, descend holding to PT or caregiver. Pt continues to demonstrate impaired midline orientation and overall strength. Pt would benefit from continued skilled PT treatment to focus on caregiver training to ensure safe d/c for pt & caregiver and to focus on deficits noted above/below.  Patient continues to demonstrate the following deficits muscle weakness, decreased cardiorespiratoy endurance, decreased coordination, decreased attention, decreased awareness, decreased problem solving, decreased safety awareness, decreased memory and delayed processing, and decreased standing balance, decreased postural control and decreased balance strategies and therefore will continue to benefit from skilled PT intervention to increase functional independence with mobility.  Patient progressing toward long term goals..  Continue plan of care.  PT Short Term Goals Week 4:  PT Short Term Goal 1 (Week 4): Pt will complete bed<>w/c consistently with CGA & LRAD. PT Short Term Goal 1 - Progress (Week 4): Progressing toward goal PT Short Term Goal 2 (Week 4): Pt will ambulate 50 ft with LRAD & CGA. PT Short Term Goal 2 - Progress (Week 4): Met PT Short Term Goal 3 (Week 4): Pt will negotiate 8 steps with LUE support & mod assist. PT Short Term Goal 3 - Progress (Week 4): Met Week 5:  PT Short Term Goal 1 (Week 5): STG = LTG due to estimated d/c date.   Therapy Documentation Precautions:   Precautions Precautions: Fall Precaution Comments: NPO Restrictions Weight Bearing Restrictions: No   Therapy/Group: Individual Therapy  [name] 02/16/2020, 9:21 AM

## 2020-02-16 NOTE — Progress Notes (Signed)
Patient ID: Erika Stanton, female   DOB: 25-Jan-1956, 64 y.o.   MRN: [id number]  SW met with pt and pt husband in room to provide updates from team conference, DME recs- RW and transport chair, and outpatient PT/OT/SLP. SW informed DME will be ordered. Preferred outpatient location is [facility] (p:[phone]/[phone]).   SW sent DME orders to [facility] via parachute. SW faxed referral to [facility], MSW, [place] Office: [phone] Cell: [phone] Fax: [phone]

## 2020-02-16 NOTE — Patient Care Conference (Signed)
Inpatient RehabilitationTeam Conference and Plan of Care Update Date: 02/16/2020   Time: 10:33 AM    Patient Name: Erika Stanton      Medical Record Number: [id number]  Date of Birth: 1955-11-05 Sex: Female         Room/Bed: 4W17C/4W17C-01 Payor Info: Payor: [place] / Plan: [facility] PPO / Product Type: *No Product type* /    Admit Date/Time:  01/20/2020  5:15 PM  Primary Diagnosis:  Cerebral aneurysm rupture [facility])  Hospital Problems: Principal Problem:   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[place]) Active Problems:   Elevated BUN   Prediabetes   Incontinence of feces   Melena    Expected Discharge Date: Expected Discharge Date: 02/23/20  Team Members Present: Physician leading conference: Dr. Alger Simons Care Coodinator Present: Dorthula Nettles, RN, BSN, CRRN;Loralee Pacas, [place] Nurse Present: Other (comment) Demetrios Loll, RN) PT Present: Lavone Nian, PT OT Present: Meriel Pica, OT SLP Present: Weston Anna, SLP PPS Coordinator present : Ileana Ladd, Burna Mortimer, SLP     Current Status/Progress Goal Weekly Team Focus  Bowel/Bladder   continent b/b; LBM: 10/03  gain regular bowel pattern  assist with tolieting needs prn   Swallow/Nutrition/ Hydration   Dys. 3 textures with thin liquids, Supervision  Min A  Diet tolerance, family education   ADL's   SPT / ambulation without AD CG/min A, bathing min/mod A, UB dressing mod A, LB dressing min A, toileting mod A, husband participated in family ed Monday - would benefit from ongoing sessions.  min A  ADL training, R NMRE, funcitonal mobility, family education, discharge planning   Mobility   min assist gait without AD, mod assist stair negotiation, impaired awareness/cognition  previously upgraded to min assist<>supervision overall  bed mobility, transfers, balance, cognition, activity tolerance, NMR, strengthening, gait, stairs, family education & d/c planning   Communication    Supervision  Min A  use of speech strategies, family education   Safety/Cognition/ Behavioral Observations  Min A  Min A  Family education   Pain   c/o headache pains; prn tylenol  <4/10  assess pain QS and prn   Skin   ecchymosis on abdomen  remain free of new skin breakdown/infection  assess skin QS and prn     Discharge Planning:  D/c to home with husband who will provide 24/7 care pending pt level of care needs at d/c. FAm edu on Tues  (10/5) 1pm-3pm and Wed (10/6) 9am until completed.   Team Discussion: Family education today and tomorrow. OT making good progress, still needs assist with ADL's. PT education is on-going with the husband. SLP is doing trials of DYS 3, cognition is improving and going well. Patient on target to meet rehab goals: yes  *See Care Plan and progress notes for long and short-term goals.   Revisions to Treatment Plan:  None at this time.  Teaching Needs: Continue with family education.  Current Barriers to Discharge: Medication compliance and Nutritional means  Possible Resolutions to Barriers: Advance diet and daily adjustment of medications.     Medical Summary Current Status: improving cognitively and seeing RUE improve from a motor standpoint. eating adequately now. drinks plenty. bp controlled. no further GI bleeding  Barriers to Discharge: Medical stability   Possible Resolutions to [employer] Focus: daily lab/vs review, adjustment of meds, advancing diet   Continued Need for Acute Rehabilitation Level of Care: The patient requires daily medical management by a physician with specialized training in physical medicine and  rehabilitation for the following reasons: Direction of a multidisciplinary physical rehabilitation program to maximize functional independence : Yes Medical management of patient stability for increased activity during participation in an intensive rehabilitation regime.: Yes Analysis of laboratory values and/or  radiology reports with any subsequent need for medication adjustment and/or medical intervention. : Yes   I attest that I was present, lead the team conference, and concur with the assessment and plan of the team.   Cristi Loron 02/16/2020, 2:46 PM

## 2020-02-16 NOTE — Progress Notes (Signed)
Physical Therapy Session Note  Patient Details  Name: Erika Stanton MRN: [id number] Date of Birth: 1955/08/01  Today's Date: 02/16/2020 PT Individual Time: 1522-1606 PT Individual Time Calculation (min): 44 min   Short Term Goals: Week 5:  PT Short Term Goal 1 (Week 5): STG = LTG due to estimated d/c date.  Skilled Therapeutic Interventions/Progress Updates:  Pt received in bed with husband present for caregiver training. Supine<>sit with supervision & hospital bed features & PT assists with donning B shoes. Husband Ronalee Belts) dons gait belt with PT educating him on need for snug fit. Ronalee Belts assists pt with managing RUE on hand orthosis & gait in room/bathroom, appropriately cuing pt on safety with gait & with RW. Pt with continent void on toilet. Checked Ronalee Belts off to assist pt to bathroom & safety plan updated to reflect. Gait room<>ortho gym with Braxton providing assist & cuing for safety with PT providing intermittent cuing for increased step width L>R vs scissoring at times. Pt completes car transfer at SUV simulated height with Ronalee Belts assisting gait to car but otherwise pt able to place BLE in/out of car. Discussed transition car>steps with Ronalee Belts reporting stepping stones & grass with PT suggesting he wheel pt car>steps in transport chair but he voices concerns re: this. Pt then ambulates over ramp & mulch to simulate outdoor environment with Ronalee Belts providing assist and PT providing cuing for RW use (push vs lift it off ground). PT encourages pt & Ronalee Belts to practice uneven surfaces as much as possible. Back in room pt left in bed with alarm set, call bell in reach, Ronalee Belts present in room.  PT educates pt & Ronalee Belts on need for pt to use RW for all functional mobility to increase safety.  Therapy Documentation Precautions:  Precautions Precautions: Fall Precaution Comments: NPO Restrictions Weight Bearing Restrictions: No   Pain: unrated RUE pain with use of hand orthosis on RW - attempts to  reposition for increased comfort   Therapy/Group: Individual Therapy  [name] 02/16/2020, 4:14 PM

## 2020-02-16 NOTE — Progress Notes (Signed)
Occupational Therapy Session Note  Patient Details  Name: Erika Stanton MRN: [id number] Date of Birth: 06-23-1955  Today's Date: 02/16/2020 OT Individual Time: 3254-9826  &   1300-1400 OT Individual Time Calculation (min): 45 min   &  60 min   Short Term Goals: Week 3:  OT Short Term Goal 1 (Week 3): Pt will be able to use LUE to wash R arm, torso and LEs with CGA. OT Short Term Goal 2 (Week 3): Pt will use LUE to pull  a shirt on and off over her head. OT Short Term Goal 3 (Week 3): Pt will be able to tolerate self ROM to RUE with min A.  Skilled Therapeutic Interventions/Progress Updates:    AM session:   Patient in bed, alert and aware of date.  She notes intermittent pain in right UE.  Supine to sitting edge of bed CS.  Sit to stand and short distance ambulation with RW/right hand assist CGA to/from bed and arm chair.  Set up/CS for breakfast - provided plate guard for increased ease of scooping.  Change of clothing while seated in arm chair, mod A OH shirt, min A pants, CM in stance mod A.  Ambulation with RW to recliner CGA.  She remained seated in recliner at close of session, seat belt alarm set and call bell in hand.   PM session:   Patient in bed, alert and ready for PM therapy session.  She denies pain at this time.  Supine to sit with CS, sit to stand and ambulation with RW (right hand assist) CGA bed to/from toilet.  toileting completed with min A pants down/up.  Husband present for therapy session.  Comprehensive UB HEP provided and reviewed with patient and husband to include left UE AROM and conditioning exercises, right UE AROM/positioning, weight bearing, inhibition, posture and pain control.  Exercises completed both in seated and standing positions.  Ongoing review recommended in upcoming sessions.  Patient returned to bed at close of session with CS.  Bed alarm set and call bell in reach.      Therapy Documentation Precautions:  Precautions Precautions: Fall Precaution  Comments: NPO Restrictions Weight Bearing Restrictions: No   Therapy/Group: Individual Therapy  Carlos Levering 02/16/2020, 7:35 AM

## 2020-02-16 NOTE — Progress Notes (Addendum)
Nutrition Follow-up  DOCUMENTATION CODES:   Not applicable  INTERVENTION:  - Please obtain updated weight, last weight is from 9/08  -Continue Magic Cup TID with meals, each supplement provides 290 kcal and 9 grams of protein  - Encourage adequate PO intake and provide feeding assistance as needed  ADDENDUM (02/17/20): Per SLP, pt does not like [employer] but does like [employer]. RD to order Boost Breeze po TID, each supplement provides 250 kcal and 9 grams of protein. RD will d/c Magic Cups.  NUTRITION DIAGNOSIS:   Inadequate oral intake related to dysphagia, lethargy/confusion as evidenced by meal completion < 50%.  Progressing  GOAL:   Patient will meet greater than or equal to 90% of their needs  Progressing  MONITOR:   PO intake, Supplement acceptance, Diet advancement, Labs, Weight trends  REASON FOR ASSESSMENT:   Consult Enteral/tube feeding initiation and management  ASSESSMENT:   64 year old female who was admitted on 01/05/20 with right-sided headache and left hemiparesis. 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 Pt remains NPO due to signs of aspiration with tube feeds via Cortrak. Admitted to CIR on 9/08.  09/15 - s/p EGD with findings of non-bleeding reflux esophagitis, Cortrak removed 09/17 - MBS with recommendations for dysphagia 1 diet with nectar-thick liquids, Cortrak replaced, tip gastric per Cortrak team 09/22 - NPO 09/23 - MBS with recommendations for dysphagia 1 diet with thin liquids, Cortrak removed and TF d/c 09/28 - diet advanced to dysphagia 2 10/05 - diet advanced to dysphagia  3  Pt now on dysphagia 3 diet with much improved PO intake. Target d/c date is 10/12. Family education scheduled for today and tomorrow.  Meal Completion: 50-75%  Medications reviewed and include: ritalin, protonix, fibercon, klor-con, florastor  Labs reviewed.  Diet Order:   Diet Order            DIET DYS 3 Room service appropriate? Yes; Fluid consistency: Thin  Diet effective 1000                 EDUCATION NEEDS:   No education needs have been identified at this time  Skin:  Skin Assessment: Reviewed RN Assessment  Last BM:  02/16/20 medium type 5  Height:   Ht Readings from Last 1 Encounters:  01/20/20 5\' 6"  (1.676 m)    Weight:   Wt Readings from Last 1 Encounters:  01/20/20 89.5 kg    Ideal Body Weight:  59.1 kg  BMI:  Body mass index is 31.85 kg/m.  Estimated Nutritional Needs:   Kcal:  1900-2100  Protein:  95-115 grams  Fluid:  >/= 1.9 L    Gaynell Face, MS, RD, LDN Inpatient Clinical Dietitian Please see AMiON for contact information.

## 2020-02-17 ENCOUNTER — Ambulatory Visit (HOSPITAL_COMMUNITY): Payer: BC Managed Care – PPO | Admitting: Physical Therapy

## 2020-02-17 ENCOUNTER — Encounter (HOSPITAL_COMMUNITY): Payer: BC Managed Care – PPO | Admitting: Speech Pathology

## 2020-02-17 ENCOUNTER — Encounter (HOSPITAL_COMMUNITY): Payer: BC Managed Care – PPO | Admitting: Occupational Therapy

## 2020-02-17 ENCOUNTER — Inpatient Hospital Stay (HOSPITAL_COMMUNITY): Payer: BC Managed Care – PPO

## 2020-02-17 MED ORDER — SACCHAROMYCES BOULARDII 250 MG PO CAPS
250.0000 mg | ORAL_CAPSULE | Freq: Two times a day (BID) | ORAL | Status: DC
  Administered 2020-02-17 – 2020-02-23 (×12): 250 mg via ORAL
  Filled 2020-02-17 (×11): qty 1

## 2020-02-17 MED ORDER — BOOST / RESOURCE BREEZE PO LIQD CUSTOM
1.0000 | Freq: Three times a day (TID) | ORAL | Status: DC
  Administered 2020-02-18 – 2020-02-22 (×5): 1 via ORAL

## 2020-02-17 NOTE — Progress Notes (Signed)
Physical Therapy Session Note  Patient Details  Name: Erika Stanton MRN: [id number] Date of Birth: 02/09/1956  Today's Date: 02/17/2020 PT Individual Time: 0902-0945 PT Individual Time Calculation (min): 43 min   Short Term Goals: Week 4:  PT Short Term Goal 1 (Week 4): Pt will complete bed<>w/c consistently with CGA & LRAD. PT Short Term Goal 1 - Progress (Week 4): Progressing toward goal PT Short Term Goal 2 (Week 4): Pt will ambulate 50 ft with LRAD & CGA. PT Short Term Goal 2 - Progress (Week 4): Met PT Short Term Goal 3 (Week 4): Pt will negotiate 8 steps with LUE support & mod assist. PT Short Term Goal 3 - Progress (Week 4): Met  Skilled Therapeutic Interventions/Progress Updates: Pt presents sitting in w/c and agreeable to participate w/ therapy.  Pt wheeled to dayroom for energy and time conservation.  Pt performed multiple sit to stand w/ CGA and performed Connect-4 game on solid surface as well as [employer] cushion.  Pt reaching outside of BOS and crossing midline w/ LUE w/o LOB.  Pt performed negotiation of cone obstacle course w/o AD and then reaching for cones on floor w/ verbal cues for breathing technique.  Pt amb w/ RW and CGA to supervision up to 150' and verbal cues for posture and maintaining BOS.  Pt amb up to 100' w/o AD and CGA, w/ decreased BOS, but no LOB.  Pt requires seated rest breaks and encouragement to perform.  Pt returned to room and chair alarm on and all needs in reach.     Therapy Documentation Precautions:  Precautions Precautions: Fall Precaution Comments: NPO Restrictions Weight Bearing Restrictions: No General:   Vital Signs:   Pain:2/10 R hand, always hurts, received meds prior to therapy Pain Assessment Pain Scale: 0-10 Pain Score: 3  Faces Pain Scale: Hurts a little bit Pain Type: Acute pain Pain Location: Head Pain Descriptors / Indicators: Aching;Throbbing Pain Frequency: Occasional Pain Onset: On-going Patients Stated Pain Goal: 0  Pain Intervention(s): Medication (See eMAR) Multiple Pain Sites: No Mobility:    Therapy/Group: Individual Therapy  Jeffrey P VanDeven 02/17/2020, 9:47 AM  

## 2020-02-17 NOTE — Progress Notes (Signed)
Occupational Therapy Session Note  Patient Details  Name: EULLA KOCHANOWSKI MRN: [id number] Date of Birth: [birth year]  Today's Date: 02/17/2020 OT Individual Time: 1315-1415 OT Individual Time Calculation (min): 60 min    Short Term Goals: Week 1:  OT Short Term Goal 1 (Week 1): patient will roll in bed with min A, move from side lying to sitting edge of bed with mod A of one OT Short Term Goal 1 - Progress (Week 1): Met OT Short Term Goal 2 (Week 1): patient will tolerate unsupported sitting with min A for 10 minutes with head midline OT Short Term Goal 2 - Progress (Week 1): Met OT Short Term Goal 3 (Week 1): patient will use left UE to wash 25% of upper body and assist with donning OH shirt max A OT Short Term Goal 3 - Progress (Week 1): Progressing toward goal OT Short Term Goal 4 (Week 1): patient will scan environment both right and left to locate items with moderate cues OT Short Term Goal 4 - Progress (Week 1): Met  Skilled Therapeutic Interventions/Progress Updates:    1:1. Pt received in standing at sink with husband washing hands after trip to the bathroom. Focus of session on RUE NMR using table top, HOH tasks: reaching for blocks, stacking cups, squeezing styrofoam cup, pronating/supinating hand with cup, and facilitation of tenodesis grasp to improve extension/felxion of R fingers. Pt requires MOD A to reach with RUE and MAX facilitation of gross grasp/release. Pt with trace digit flexion especially along median nerve distribution. All tasks completed with LUE off to the side of the wheelchiar. When pt attempted to do tasks with self assist from LUE unable d/t R inattention. Exited session with pt at sink with husband brushing teeth, gait belt on per safety plan  Therapy Documentation Precautions:  Precautions Precautions: Fall Precaution Comments: NPO Restrictions Weight Bearing Restrictions: No General:   Vital Signs: Therapy Vitals Temp: 98 F (36.7 C) Pulse Rate:  74 Resp: 18 BP: 122/77 Patient Position (if appropriate): Sitting Oxygen Therapy SpO2: 100 % O2 Device: Room Air Pain:   ADL: ADL Eating: NPO Grooming: Dependent Where Assessed-Grooming: Bed level Upper Body Bathing: Maximal assistance Where Assessed-Upper Body Bathing: Shower Lower Body Bathing: Maximal assistance Where Assessed-Lower Body Bathing: Shower Upper Body Dressing: Maximal assistance Where Assessed-Upper Body Dressing: Wheelchair Lower Body Dressing: Maximal assistance Where Assessed-Lower Body Dressing: Wheelchair, Standing at sink, Sitting at sink Toileting: Dependent Where Assessed-Toileting: Bed level Social research officer, government: Minimal assistance Social research officer, government Method: Stand pivot ADL Comments: patient able to hold wash cloth with left hand but unable to manipulate for washing, she is able to lift each leg for donning pants/socks Vision   Perception    Praxis   Exercises:   Other Treatments:     Therapy/Group: Individual Therapy  Tonny Branch 02/17/2020, 4:09 PM

## 2020-02-17 NOTE — Progress Notes (Signed)
Patient ID: Erika Stanton, female   DOB: [birth year], 64 y.o.   MRN: [id number]  Met with pt and husband to give him the number to [facility] OP rehab. He plans to call to get an appointment prior to pt going home so there is no lag time between discharge and OP appointment.

## 2020-02-17 NOTE — Progress Notes (Signed)
Speech Language Pathology Daily Session Note  Patient Details  Name: MARGERITE IMPASTATO MRN: [id number] Date of Birth: 04/20/1956  Today's Date: 02/17/2020 SLP Individual Time: 0950-1030 SLP Individual Time Calculation (min): 40 min  Short Term Goals: Week 4: SLP Short Term Goal 1 (Week 4): Patient will consume current diet with minimal overt s/s of aspiration and overall Mod I for use of swallowing compensatory strategies. SLP Short Term Goal 2 (Week 4): Patient will demonstrate efficient mastication with trials of Dys. 3 textures without overt s/s of aspiration with Min A verbal cues over 2 sessions prior to upgrade. SLP Short Term Goal 3 (Week 4): Patient will demonstrate sustained attention to functional tasks for 30 minutes with Min verbal cues for redirection. SLP Short Term Goal 4 (Week 4): Patient will demonstrate functional problem solving for basic and familiar tasks with Min A verbal cues. SLP Short Term Goal 5 (Week 4): Patient will recall new, daily information with Min A verbal cues for use of external aids.  Skilled Therapeutic Interventions: Skilled treatment session focused on cognitive goals. SLP facilitated session by administering the Cognistat. Patient scored WFL on all subtests with the exception of mild impairments in visual construction tasks. Patient also recalled the functions of her current medications with overall Min A verbal cues with plans organize a 3X/day pill box during the next session. Patient left upright in the recliner with alarm on and all needs within reach. Continue with current plan of care.       Pain No/Denies Pain   Therapy/Group: Individual Therapy  Kateena Degroote 02/17/2020, 12:04 PM

## 2020-02-17 NOTE — Progress Notes (Signed)
Occupational Therapy Weekly Progress Note  Patient Details  Name: Erika Stanton MRN: [id number] Date of Birth: Jul 26, 1955  Beginning of progress report period: February 06, 2020 End of progress report period: February 17, 2020  Today's Date: 02/17/2020 OT Individual Time: 1030-1120 OT Individual Time Calculation (min): 50 min    Patient has met 3 of 3 short term goals. Pt is making excellent progress this week and is now using her LUE in a very functional manner as a dominant A.  She continues to have RUE pain but is tolerating more movement and has even developed increased AROM such as sh abd and elbow flexion but has not been able to integrate into functional tasks without hand over hand A. Improved ability to rise to stand and hold standing balance.   Patient continues to demonstrate the following deficits: decreased cardiorespiratoy endurance, abnormal tone and decreased coordination, decreased attention to right and decreased standing balance, decreased postural control, hemiplegia and decreased balance strategies and therefore will continue to benefit from skilled OT intervention to enhance overall performance with BADL.  Patient progressing toward long term goals..  Continue plan of care.  OT Short Term Goals Week 1:  OT Short Term Goal 1 (Week 1): patient will roll in bed with min A, move from side lying to sitting edge of bed with mod A of one OT Short Term Goal 1 - Progress (Week 1): Met OT Short Term Goal 2 (Week 1): patient will tolerate unsupported sitting with min A for 10 minutes with head midline OT Short Term Goal 2 - Progress (Week 1): Met OT Short Term Goal 3 (Week 1): patient will use left UE to wash 25% of upper body and assist with donning OH shirt max A OT Short Term Goal 3 - Progress (Week 1): Progressing toward goal OT Short Term Goal 4 (Week 1): patient will scan environment both right and left to locate items with moderate cues OT Short Term Goal 4 - Progress  (Week 1): Met Week 2:  OT Short Term Goal 1 (Week 2): Pt will use L hand with mod A to wash 50 % of her UB. OT Short Term Goal 1 - Progress (Week 2): Met OT Short Term Goal 2 (Week 2): Pt will be able to sit upright on  a BSC with min A. OT Short Term Goal 2 - Progress (Week 2): Met OT Short Term Goal 3 (Week 2): Pt will be able to squat pivot with mod A of 2 to a BSC. OT Short Term Goal 3 - Progress (Week 2): Met OT Short Term Goal 4 (Week 2): Pt will be able to sit to stand with mod A of 2 to increase skills needed for LB dressing. OT Short Term Goal 4 - Progress (Week 2): Met Week 3:  OT Short Term Goal 1 (Week 3): Pt will be able to use LUE to wash R arm, torso and LEs with CGA. OT Short Term Goal 1 - Progress (Week 3): Met OT Short Term Goal 2 (Week 3): Pt will use LUE to pull  a shirt on and off over her head. OT Short Term Goal 2 - Progress (Week 3): Met OT Short Term Goal 3 (Week 3): Pt will be able to tolerate self ROM to RUE with min A. OT Short Term Goal 3 - Progress (Week 3): Met Week 4:  OT Short Term Goal 1 (Week 4): STGs = LTGs  Skilled Therapeutic Interventions/Progress Updates:    Pt received  in recliner declining a bath or shower but she did need to toilet and change clothing. Overall mod A with toileting and dressing but the main emphasis today was forced use of R hand with hand over hand guidance and sit to stand technique. In the bathroom, hand over hand (HOH) for pushing down and pulling up pants.  From chair, pushing up to stand with B hands.  Pt needs A to position and maintain her hand on arm rest.  Focused on large forward lean to lift hips and then rise to stand. For specific RUE NMR,  Used dowel bar with B hands with HOH help with R to push bar forward and back and then single hand turning it in circles as if she was stirring.  HOH with R hand as pt used L at the same time to reach forward and grasp handles of a laundry basket and release.   She c/o of hand pain and  states she can not actively flex her fingers.  Overall, pt appears to only have 25% of AROM and also R inattention which is inhibiting her from using it more.  Pt transferred back to recliner at end of session with belt alarm on and all needs met. Therapy Documentation Precautions:  Precautions Precautions: Fall Precaution Comments: NPO Restrictions Weight Bearing Restrictions: No       Pain: Pain Assessment Pain Scale: 0-10 Pain Score: 3  Faces Pain Scale: Hurts a little bit Pain Type: Acute pain Pain Location: Head Pain Descriptors / Indicators: Aching;Throbbing Pain Frequency: Occasional Pain Onset: On-going Patients Stated Pain Goal: 0 Pain Intervention(s): Medication (See eMAR) Multiple Pain Sites: No ADL: ADL Eating: NPO Grooming: Dependent Where Assessed-Grooming: Bed level Upper Body Bathing: Maximal assistance Where Assessed-Upper Body Bathing: Shower Lower Body Bathing: Maximal assistance Where Assessed-Lower Body Bathing: Shower Upper Body Dressing: Maximal assistance Where Assessed-Upper Body Dressing: Wheelchair Lower Body Dressing: Maximal assistance Where Assessed-Lower Body Dressing: Wheelchair, Standing at sink, Sitting at sink Toileting: Dependent Where Assessed-Toileting: Bed level Social research officer, government: Minimal assistance Social research officer, government Method: Stand pivot ADL Comments: patient able to hold wash cloth with left hand but unable to manipulate for washing, she is able to lift each leg for donning pants/socks   Therapy/Group: Individual Therapy  [place] 02/17/2020, 11:53 AM

## 2020-02-18 ENCOUNTER — Inpatient Hospital Stay (HOSPITAL_COMMUNITY): Payer: BC Managed Care – PPO | Admitting: Speech Pathology

## 2020-02-18 ENCOUNTER — Inpatient Hospital Stay (HOSPITAL_COMMUNITY): Payer: BC Managed Care – PPO

## 2020-02-18 ENCOUNTER — Inpatient Hospital Stay (HOSPITAL_COMMUNITY): Payer: BC Managed Care – PPO | Admitting: Occupational Therapy

## 2020-02-18 MED ORDER — POTASSIUM CHLORIDE CRYS ER 20 MEQ PO TBCR
20.0000 meq | EXTENDED_RELEASE_TABLET | Freq: Every day | ORAL | Status: DC
  Administered 2020-02-19 – 2020-02-23 (×5): 20 meq via ORAL
  Filled 2020-02-18 (×5): qty 1

## 2020-02-18 NOTE — Progress Notes (Signed)
Speech Language Pathology Weekly Progress and Session Note  Patient Details  Name: Erika Stanton MRN: [id number] Date of Birth: [birth year]  Beginning of progress report period: February 12, 2020 End of progress report period: February 18, 2020  Today's Date: 02/18/2020 SLP Individual Time: 0725-0825 SLP Individual Time Calculation (min): 60 min  Short Term Goals: Week 4: SLP Short Term Goal 1 (Week 4): Patient will consume current diet with minimal overt s/s of aspiration and overall Mod I for use of swallowing compensatory strategies. SLP Short Term Goal 1 - Progress (Week 4): Met SLP Short Term Goal 2 (Week 4): Patient will demonstrate efficient mastication with trials of Dys. 3 textures without overt s/s of aspiration with Min A verbal cues over 2 sessions prior to upgrade. SLP Short Term Goal 2 - Progress (Week 4): Met SLP Short Term Goal 3 (Week 4): Patient will demonstrate sustained attention to functional tasks for 30 minutes with Min verbal cues for redirection. SLP Short Term Goal 3 - Progress (Week 4): Met SLP Short Term Goal 4 (Week 4): Patient will demonstrate functional problem solving for basic and familiar tasks with Min A verbal cues. SLP Short Term Goal 4 - Progress (Week 4): Met SLP Short Term Goal 5 (Week 4): Patient will recall new, daily information with Min A verbal cues for use of external aids. SLP Short Term Goal 5 - Progress (Week 4): Met    New Short Term Goals: Week 5: SLP Short Term Goal 1 (Week 5): STGs=LTGs due to ELOS  Weekly Progress Updates: Patient continues to make excellent gains and has met 5 of 5 STGs this reporting period. Currently, patient is consuming Dys. 3 textures with thin liquids without overt s/s of aspiration and is overall Mod I for use of swallowing compensatory strategies. Patient continues to need intermittent overall supervision level verbal cues for problem solving and attention with self-feeding tasks.  Overall Min A verbal cues are  required for patient to complete functional and familiar tasks safely in regards to problem solving, recall and awareness. Patient and family education ongoing. Patient would benefit from continued skilled SLP intervention to maximize her cognitive and swallowing function prior to discharge.     Intensity: Minumum of 1-2 x/day, 30 to 90 minutes Frequency: 3 to 5 out of 7 days Duration/Length of Stay: 02/23/20 Treatment/Interventions: Cognitive remediation/compensation;Dysphagia/aspiration precaution training;Internal/external aids;Speech/Language facilitation;Therapeutic Activities;Environmental controls;Cueing hierarchy;Functional tasks;Patient/family education   Daily Session  Skilled Therapeutic Interventions: Skilled treatment session focused on dysphagia and cognitive goals. SLP facilitated session by providing skilled observation with breakfast meal of Dys. 3 textures with thin liquids. Patient consumed meal with overt cough X 1, suspect due to attempting to talk while drinking. Recommend patient continue current diet. Min A verbal cues were required for problem solving with self-feeding in regards to appropriate postitioning and utilizing appropriate assistive devices like the plate guard. Patient appeared to demonstrate improved divided attention between self-feeding and functional conversation but continues to take ~45-60 minutes to complete a meal. Suspect function is impacted by decreased coordination and strength in her LUE which is her non-dominant hand. Patient left upright in the recliner with all needs within reach and alarm on. Continue with current plan of care.        Pain No/Denies Pain   Therapy/Group: Individual Therapy  Marqui Formby 02/18/2020, 6:26 AM

## 2020-02-18 NOTE — Plan of Care (Signed)
  Problem: Consults Goal: RH BRAIN INJURY PATIENT EDUCATION Description: Description: See Patient Education module for eduction specifics Outcome: Progressing Goal: Skin Care Protocol Initiated - if Braden Score 18 or less Description: If consults are not indicated, leave blank or document N/A Outcome: Progressing Goal: Nutrition Consult-if indicated Outcome: Progressing Goal: Diabetes Guidelines if Diabetic/Glucose > 140 Description: If diabetic or lab glucose is > 140 mg/dl - Initiate Diabetes/Hyperglycemia Guidelines & Document Interventions  Outcome: Progressing   Problem: RH BOWEL ELIMINATION Goal: RH STG MANAGE BOWEL WITH ASSISTANCE Description: STG Manage Bowel with min Assistance. Outcome: Progressing Goal: RH STG MANAGE BOWEL W/MEDICATION W/ASSISTANCE Description: STG Manage Bowel with Medication with min Assistance. Outcome: Progressing   Problem: RH BLADDER ELIMINATION Goal: RH STG MANAGE BLADDER WITH ASSISTANCE Description: STG Manage Bladder With min Assistance Outcome: Progressing Goal: RH STG MANAGE BLADDER WITH MEDICATION WITH ASSISTANCE Description: STG Manage Bladder With Medication With min Assistance. Outcome: Progressing   Problem: RH SKIN INTEGRITY Goal: RH STG SKIN FREE OF INFECTION/BREAKDOWN Description: Skin to remain free from infection and breakdown with min assist while on CIR. Outcome: Progressing Goal: RH STG MAINTAIN SKIN INTEGRITY WITH ASSISTANCE Description: STG Maintain Skin Integrity With min Assistance. Outcome: Progressing Goal: RH STG ABLE TO PERFORM INCISION/WOUND CARE W/ASSISTANCE Description: STG Able To Perform Incision/Wound Care With min Assistance. Outcome: Progressing   Problem: RH SAFETY Goal: RH STG ADHERE TO SAFETY PRECAUTIONS W/ASSISTANCE/DEVICE Description: STG Adhere to Safety Precautions With min Assistance and appropriate assistive Device. Outcome: Progressing   Problem: RH COGNITION-NURSING Goal: RH STG USES MEMORY  AIDS/STRATEGIES W/ASSIST TO PROBLEM SOLVE Description: STG Uses Memory Aids/Strategies With min Assistance to Problem Solve. Outcome: Progressing Goal: RH STG ANTICIPATES NEEDS/CALLS FOR ASSIST W/ASSIST/CUES Description: STG Anticipates Needs/Calls for Assist With min Assistance/Cues. Outcome: Progressing   Problem: RH PAIN MANAGEMENT Goal: RH STG PAIN MANAGED AT OR BELOW PT'S PAIN GOAL Description: <3 on a 0-10 pain scale. Outcome: Progressing   Problem: RH KNOWLEDGE DEFICIT BRAIN INJURY Goal: RH STG INCREASE KNOWLEDGE OF SELF CARE AFTER BRAIN INJURY Description: Patient will demonstrate knowledge of medication management, diabetes management, dietary management, and follow up care with the MD post discharge with min assist from [facility] staff. Outcome: Progressing Goal: RH STG INCREASE KNOWLEDGE OF DYSPHAGIA/FLUID INTAKE Description: Patient will be able to demonstrate knowledge of dysphagia diets, thickened liquids, and swallowing precautions with min assist from rehab staff. Outcome: Progressing   Problem: RH Vision Goal: RH LTG Vision (Specify) Outcome: Progressing

## 2020-02-18 NOTE — Progress Notes (Addendum)
[facility] PHYSICAL MEDICINE & REHABILITATION PROGRESS NOTE   Subjective/Complaints: Feeling well. Hates potassium tablets. Otherwise no complaints. Right arm pain is better  ROS: Patient denies fever, rash, sore throat, blurred vision, nausea, vomiting, diarrhea, cough, shortness of breath or chest pain,  headache, or mood change.    Objective:   No results found. No results for input(s): WBC, HGB, HCT, PLT in the last 72 hours. No results for input(s): NA, K, CL, CO2, GLUCOSE, BUN, CREATININE, CALCIUM in the last 72 hours.  Intake/Output Summary (Last 24 hours) at 02/18/2020 1117 Last data filed at 02/18/2020 0831 Gross per 24 hour  Intake 770 ml  Output --  Net 770 ml     Physical Exam: Vital Signs Blood pressure 110/74, pulse 81, temperature 98 F (36.7 C), resp. rate 17, height 5\' 6"  (1.676 m), weight 89.5 kg, SpO2 95 %.  Constitutional: No distress . Vital signs reviewed. HEENT: EOMI, oral membranes moist Neck: supple Cardiovascular: RRR without murmur. No JVD    Respiratory/Chest: CTA Bilaterally without wheezes or rales. Normal effort    GI/Abdomen: BS +, non-tender, non-distended Ext: no clubbing, cyanosis, or edema Psych: pleasant and cooperative Neurologic:LUQ-anopsia. EOMI. Good speech quality. Improved insight and awareness. Left CN VII.    2+ bicep, triceps/ 1+ WE, trace fingers right UE> , LUE 2+/5 , LLE 4/5. Right arm is less sensitive to touch with decreased LT. Musculoskeletal: Right arm much more easily ranged today Skin: bruises slowly resolving     Assessment/Plan: 1. 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  Physiatrist is providing close team supervision and 24 hour management of active medical problems listed below.  Physiatrist and rehab team continue to assess barriers to discharge/monitor patient progress  toward functional and medical goals  Care Tool:  Bathing    Body parts bathed by patient: Front perineal area, Abdomen, Left upper leg, Right upper leg, Chest, Right lower leg, Face, Left lower leg   Body parts bathed by helper: Right arm, Left arm, Buttocks, Left lower leg     Bathing assist Assist Level: Moderate Assistance - Patient 50 - 74%     Upper Body Dressing/Undressing Upper body dressing   What is the patient wearing?: Pull over shirt, Bra    Upper body assist Assist Level: Moderate Assistance - Patient 50 - 74%    Lower Body Dressing/Undressing Lower body dressing      What is the patient wearing?: Underwear/pull up, Pants     Lower body assist Assist for lower body dressing: Minimal Assistance - Patient > 75%     Toileting Toileting    Toileting assist Assist for toileting: Moderate Assistance - Patient 50 - 74%     Transfers Chair/bed transfer  Transfers assist     Chair/bed transfer assist level: Minimal Assistance - Patient > 75%     Locomotion Ambulation   Ambulation assist      Assist level: Contact Guard/Touching assist Assistive device: Walker-rolling Max distance: 150   Walk 10 feet activity   Assist     Assist level: Contact Guard/Touching assist Assistive device: Walker-rolling   Walk 50 feet activity   Assist Walk 50 feet with 2 turns activity did not occur: Safety/medical concerns  Assist level: Contact Guard/Touching assist Assistive device: Walker-rolling    Walk 150 feet activity   Assist Walk 150 feet activity did not occur: Safety/medical concerns  Assist level:  Contact Guard/Touching assist Assistive device: Walker-rolling    Walk 10 feet on uneven surface  activity   Assist Walk 10 feet on uneven surfaces activity did not occur: Safety/medical concerns   Assist level: Minimal Assistance - Patient > 75% Assistive device: Aeronautical engineer Will patient use wheelchair  at discharge?: Yes Type of Wheelchair: Manual           Wheelchair 50 feet with 2 turns activity    Assist    Wheelchair 50 feet with 2 turns activity did not occur: Safety/medical concerns (requires TIS for postural support)       Wheelchair 150 feet activity     Assist  Wheelchair 150 feet activity did not occur: Safety/medical concerns   Assist Level: Supervision/Verbal cueing   Blood pressure 110/74, pulse 81, temperature 98 F (36.7 C), resp. rate 17, height 5\' 6"  (1.676 m), weight 89.5 kg, SpO2 95 %.  Medical Problem List and Plan: 1.  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  Continue CIR, PT, OT, SLP     -making progressive in all areas  -husband receiving education, dispo planning 2.  Antithrombotics: -DVT/anticoagulation:  Pharmaceutical: Heparin--hold                          Dopplers negative for DVT.              -antiplatelet therapy: DAPT--resumed plavix, asa without problems   3. Pain Management: Tylenol prn.    -neuropathic pain RUE   -10/1 right arm much improved. Continue same gabapentin  Right arm subluxation- continue support with pillow/lap tray. She's getting return in the RUE which is encouraging.  4. Mood: LCSW to follow for evaluation and support when appropriate.              -antipsychotic agents: N/A 5. Neuropsych: This patient is not capable of making decisions on her own behalf.  -arousal and initiation much better. Ritalin stopped. 6. Skin/Wound Care: routine pressure relief measures.   7. Fluids/Electrolytes/Nutrition:   -on D3/thins, eating 50-75%+  -recheck CMET 10/8, pt would like to stop potassium   8. ABLA/UGIB:     EGD 9/15 apparently demonstrated non-bleeding esophagitis/gastritis, HH   -husband doesn't want to pursue further testing  9/17: resumed plavix/asa. Will not resume  sq heparin   10/1 continue protonix   - most recent hgb holding at 11.7 9/27   -no bleeding   -hgb stable/increased 10/4 12.1 9. 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 Resting tachycardia:              well controlled 12. Vasospasm prevention: nimotop completed 13. Bowel incontinence/loose stool:   -fiber/probiotic  -stools now formed  14. Proteus UTI/enteroccus UTI: Ceftriaxone course completed for proteus  -amoxil x 5 days for enterococcus  completed 9/24 18.  Prediabetes,    Relatively controlled. dc'ed  CBG checks          LOS: 29 days A FACE TO FACE EVALUATION WAS PERFORMED  Meredith Staggers 02/18/2020, 11:17 AM

## 2020-02-18 NOTE — Discharge Summary (Signed)
Physician Discharge Summary  Patient ID: [name] MRN: [id number] DOB/AGE: 1956/05/01 64 y.o.  Admit date: 01/20/2020 Discharge date: 02/23/2020  Discharge Diagnoses:  Principal Problem:   Cerebral aneurysm rupture [facility]) Active Problems:   ICH (intracerebral hemorrhage) (HCC)   Dysphagia, post-stroke   Acute blood loss anemia   Elevated BUN   Prediabetes   GIB (gastrointestinal bleeding)   Discharged Condition: stable   Significant Diagnostic Studies: DG Swallowing Func-Speech Pathology  Result Date: 02/04/2020 Objective Swallowing Evaluation: Type of Study: MBS-Modified Barium Swallow Study  Patient Details Name: Erika Stanton MRN: [id number] Date of Birth: 1956/04/13 Today's Date: 02/04/2020 Time: SLP Start Time (ACUTE ONLY): 0930 -SLP Stop Time (ACUTE ONLY): 1000 SLP Time Calculation (min) (ACUTE ONLY): 30 min Past Medical History: No past medical history on file. Past Surgical History: Past Surgical History: Procedure Laterality Date . CHOLECYSTECTOMY   . COLONOSCOPY  11/2017  at [facility]. no recurrent polyps.  suggest repeat surveillance study 11/2022.   Marland Kitchen COLONOSCOPY W/ POLYPECTOMY  06/2014  Dr Roxy Manns at [facility].  3 adenomatoous polyps, anal fissure.   . ESOPHAGOGASTRODUODENOSCOPY (EGD) WITH PROPOFOL N/A 01/27/2020  Procedure: ESOPHAGOGASTRODUODENOSCOPY (EGD) WITH PROPOFOL;  Surgeon: Mauri Pole, MD;  Location: [place] [facility];  Service: Endoscopy;  Laterality: N/A; . IR 3D INDEPENDENT WKST  01/06/2020 . IR ANGIO INTRA EXTRACRAN SEL INTERNAL CAROTID BILAT MOD SED  01/06/2020 . IR ANGIO VERTEBRAL SEL VERTEBRAL UNI L MOD SED  01/06/2020 . IR ANGIOGRAM FOLLOW UP STUDY  01/06/2020 . IR ANGIOGRAM FOLLOW UP STUDY  01/06/2020 . IR ANGIOGRAM FOLLOW UP STUDY  01/06/2020 . IR ANGIOGRAM FOLLOW UP STUDY  01/06/2020 . IR ANGIOGRAM FOLLOW UP STUDY  01/06/2020 . IR ANGIOGRAM FOLLOW UP STUDY  01/06/2020 . IR ANGIOGRAM FOLLOW UP STUDY  01/06/2020 . IR ANGIOGRAM FOLLOW UP STUDY  01/06/2020 . IR ANGIOGRAM FOLLOW UP  STUDY  01/06/2020 . IR ANGIOGRAM FOLLOW UP STUDY  01/06/2020 . IR NEURO EACH ADD'L AFTER BASIC UNI RIGHT (MS)  01/06/2020 . IR TRANSCATH/EMBOLIZ  01/06/2020 . RADIOLOGY WITH ANESTHESIA N/A 01/06/2020  Procedure: IR WITH ANESTHESIA FOR ANEURYSM;  Surgeon: Consuella Lose, MD;  Location: [place];  Service: Radiology;  Laterality: N/A; . TUBAL LIGATION   HPI: See H&P  Subjective: sleepy Assessment / Plan / Recommendation CHL IP CLINICAL IMPRESSIONS 02/04/2020 Clinical Impression Although patient reports difficulty with swallowing and displays consistent overt s/s of aspiration/penetration at the bedside, patient's overall swallowing function appears slightly improved since last MBS.  Patient was lethargic throughout study and required encouragement for full participation resulting in patient declining attempting to feed herself with her left hand or take "bigger" bites/sips.  Patient continues to demonstrate impaired epiglottic inversion, anterior laryngeal mobility and pharyngeal constriction resulting in mild vallecular residue with both solids and liquids that was reduced with additional swallows.  Patient with 1 episode of penetration to the cords with thin liquids that was sensed and cleared and mild high intermittent penetration that was reduced with a chin tuck.  Suspect patient's overall swallowing function will continue to improve with removal of NG tube. Recommend patient initiate a diet of Dys. 1 textures with thin liquids with goal of upgrading solids quickly as cognitive functioning continues to improve in order to maximize PO intake and aid in removal of NG tube. SLP Visit Diagnosis -- Attention and concentration deficit following -- Frontal lobe and executive function deficit following -- Impact on safety and function --   CHL IP TREATMENT RECOMMENDATION 02/04/2020 Treatment Recommendations Therapy as outlined  in treatment plan below   Prognosis 02/04/2020 Prognosis for Safe Diet Advancement Good Barriers to  Reach Goals Cognitive deficits Barriers/Prognosis Comment -- CHL IP DIET RECOMMENDATION 02/04/2020 SLP Diet Recommendations Dysphagia 1 (Puree) solids;Thin liquid Liquid Administration via Spoon;Straw Medication Administration Crushed with puree Compensations Minimize environmental distractions;Slow rate;Small sips/bites;Monitor for anterior loss;Multiple dry swallows after each bite/sip Postural Changes Seated upright at 90 degrees   CHL IP OTHER RECOMMENDATIONS 02/04/2020 Recommended Consults -- Oral Care Recommendations Oral care BID Other Recommendations Order thickener from pharmacy;Prohibited food (jello, ice cream, thin soups);Remove water pitcher;Have oral suction available   CHL IP FOLLOW UP RECOMMENDATIONS 02/04/2020 Follow up Recommendations Inpatient Rehab   CHL IP FREQUENCY AND DURATION 02/04/2020 Speech Therapy Frequency (ACUTE ONLY) min 3x week Treatment Duration 3 weeks      CHL IP ORAL PHASE 02/04/2020 Oral Phase Impaired Oral - Pudding Teaspoon -- Oral - Pudding Cup -- Oral - Honey Teaspoon NT Oral - Honey Cup -- Oral - Nectar Teaspoon Lingual/palatal residue Oral - Nectar Cup -- Oral - Nectar Straw Lingual/palatal residue Oral - Thin Teaspoon Lingual/palatal residue Oral - Thin Cup -- Oral - Thin Straw Lingual/palatal residue Oral - Puree WFL Oral - Mech Soft -- Oral - Regular -- Oral - Multi-Consistency -- Oral - Pill -- Oral Phase - Comment --  CHL IP PHARYNGEAL PHASE 02/04/2020 Pharyngeal Phase Impaired Pharyngeal- Pudding Teaspoon -- Pharyngeal -- Pharyngeal- Pudding Cup -- Pharyngeal -- Pharyngeal- Honey Teaspoon NT Pharyngeal -- Pharyngeal- Honey Cup -- Pharyngeal -- Pharyngeal- Nectar Teaspoon Reduced anterior laryngeal mobility;Reduced epiglottic inversion;Reduced pharyngeal peristalsis;Pharyngeal residue - valleculae Pharyngeal -- Pharyngeal- Nectar Cup -- Pharyngeal -- Pharyngeal- Nectar Straw Reduced epiglottic inversion;Reduced pharyngeal peristalsis;Reduced anterior laryngeal  mobility;Pharyngeal residue - valleculae Pharyngeal -- Pharyngeal- Thin Teaspoon Reduced anterior laryngeal mobility;Reduced epiglottic inversion;Pharyngeal residue - valleculae Pharyngeal -- Pharyngeal- Thin Cup -- Pharyngeal -- Pharyngeal- Thin Straw Reduced anterior laryngeal mobility;Reduced pharyngeal peristalsis;Reduced epiglottic inversion;Penetration/Aspiration during swallow Pharyngeal Material does not enter airway;Material enters airway, CONTACTS cords and then ejected out;Material enters airway, remains ABOVE vocal cords and not ejected out Pharyngeal- Puree Reduced anterior laryngeal mobility;Reduced epiglottic inversion;Reduced pharyngeal peristalsis;Pharyngeal residue - valleculae Pharyngeal -- Pharyngeal- Mechanical Soft -- Pharyngeal -- Pharyngeal- Regular -- Pharyngeal -- Pharyngeal- Multi-consistency -- Pharyngeal -- Pharyngeal- Pill -- Pharyngeal -- Pharyngeal Comment --  CHL IP CERVICAL ESOPHAGEAL PHASE 02/04/2020 Cervical Esophageal Phase WFL Pudding Teaspoon -- Pudding Cup -- Honey Teaspoon -- Honey Cup -- Nectar Teaspoon -- Nectar Cup -- Nectar Straw -- Thin Teaspoon -- Thin Cup -- Thin Straw -- Puree -- Mechanical Soft -- Regular -- Multi-consistency -- Pill -- Cervical Esophageal Comment -- PAYNE, COURTNEY 02/04/2020, 10:52 AM    Weston Anna, MA, CCC-SLP [phone]            Labs:  Basic Metabolic Panel: BMP Latest Ref Rng & Units 02/22/2020 02/15/2020 02/12/2020  Glucose 70 - 99 mg/dL 96 120(H) 115(H)  BUN 8 - 23 mg/dL 5(L) 6(L) <5(L)  Creatinine 0.44 - 1.00 mg/dL 0.53 0.60 0.56  Sodium 135 - 145 mmol/L 143 135 141  Potassium 3.5 - 5.1 mmol/L 3.8 4.2 4.0  Chloride 98 - 111 mmol/L 107 101 102  CO2 22 - 32 mmol/L 25 23 25   Calcium 8.9 - 10.3 mg/dL 9.1 9.2 9.2    CBC: CBC Latest Ref Rng & Units 02/22/2020 02/15/2020 02/08/2020  WBC 4.0 - 10.5 K/uL 4.9 5.9 5.8  Hemoglobin 12.0 - 15.0 g/dL 11.6(L) 12.1 11.7(L)  Hematocrit 36 - 46 % 37.5 38.6 37.7  Platelets 150 - 400 K/uL 344  337 311    CBG: No results for input(s): GLUCAP in the last 168 hours.  Brief HPI:   Erika Stanton is a 64 y.o. female in relatively good health who was admitted on 01/05/2020 with right-sided headache and left hemiparesis.  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  Dr. Kathyrn Sheriff was consulted for input and patient underwent stent supported embolization of right-ICA aneurysm on 08/25.  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  She developed obtundation and follow-up MRI brain on 08/26 showed development of acute cortical/subcortical infarcts affecting left anterior frontal and left parietal lobe as well as punctate micro emboli in right frontal and parietal regions.  LT-EEG showed cortical dysfunction felt to be due to underlying stroke.  Keppra was discontinued on 09/05 and Nimotop was added due to concerns of vasospasms.  She was started on tube feeds for nutritional support.  As mentation improved, patient was noted to have flaccid RUE with decrease in verbal output.  She continued to have cognitive deficits with disorientation, unintelligible speech, bouts of lethargy as well as weakness.  Therapy was ongoing and CIR was recommended due to functional decline.     Hospital Course: YARENIS CERINO was admitted to rehab 01/20/2020 for inpatient therapies to consist of PT, ST and OT at least three hours five days a week. Past admission physiatrist, therapy team and rehab RN have worked together to provide customized collaborative inpatient rehab.  Due to bouts of lethargy as well as severe dysphagia she was kept n.p.o. with tube feeds  for nutrition support. Rectal tube was discontinued on 09/30 and Seroquel was discontinued due to issues with bowel incontinence and loose stools.  Fiber and probiotics were added to help with bulking.  Ceftriaxone discontinued on 09/9 as she had been treated with 5-day course of IV antibiotics for Proteus UTI.    On 09/13 p.m. patient was noted to have loose black stool.  She was also tachycardic next a.m. with hemoglobin showing dropped down to 9.8.  Aspirin and Plavix were held and subcu heparin was discontinued.  Dr. Tarri Glenn with above GI was consulted for input and suspected upper GI bleed.  She underwent EGD revealing esophagitis without bleeding as well as small hiatal hernia.  Protonix added, ASA/Plavix held and patient was  transfused with 2 units PRBCs with improvement in H&H.  Once H&H was stable, ASA/Plavix was resumed on 09/17 and she is tolerating this without any recurrent bleed. She is to continue on PPI bid till follow up with GI. Serial CBC showed that thrombocytopenia has resolved and WBC is stable.   Hypokalemia has resolved with supplementation. Patient requested d/c of K dur at discharge and she/husband have been educated on high K+ diet with recommendations to recheck labs in 1-2 weeks by PCP.  Ritalin was added on 09/13 to help improve arousal, attention and for stimulation.  Dr. Lebron Quam Neurology was reconsulted per husband's concerns regarding neurological recovery and recommended continuation of intensive rehab as well as neuropsychological evaluation of mood. Dr. Sima Matas has worked with patient on coping and adjustment issues following CVA and has educated patient  and husband on expected course of recovery.   Right shoulder pain has improved with kineseotaping. Bowel incontinence with loose stools have improved with addition of probiotics and fiber. Bladder function has improved with resolution of urinary retention and flomax was discontinued by 10/08. She has completed  her 21 day course of nimotop and resting tachycardia has resolved with improvement in activity tolerance. She has been seizure free during her stay. She has made steady progress during her rehab stay and is currently at supervision to min assist level. Extensive family education was completed with husband. She will continue to receive follow up outpatient PT, OT and ST at [facility] after discharge.     Rehab course: During patient's stay in rehab weekly team conferences were held to monitor patient's progress, set goals and discuss barriers to discharge. At admission, patient required total assist with basic ADL tasks admit mobility.  Speech was severely dysarthric, she was able to follow 50% of basic commands however was limited due to lethargy and n.p.o. was recommended with trials of oral with speech therapy only She  has had improvement in activity tolerance, balance, postural control as well as ability to compensate for deficits. She requires min assist overall for ADL tasks. She requires supervision to min assist for mobility.  She is tolerating dysphagia 3 diet, thin liquids with supervision for safe swallow strategies. She requires supervision to min cues to complete functional and familiar tasks.   Disposition: Home  Diet: Dysphagia 3, thins.  Special Instructions: 1. Needs supervision for safety. 2.  Recommend repeat BMET in 1-2 weeks to monitor potassium levels.  3. No driving or strenuous activity till cleared by MD.    Discharge Instructions    Ambulatory referral to Physical Medicine Rehab   Complete by: As directed    Moderate complexity bilat ICH     Allergies as of 02/23/2020      Reactions   Sulfa Antibiotics Rash      Medication List    STOP taking these medications   niMODipine 6 MG/ML Soln Commonly known as: NYMALIZE     TAKE these medications   acetaminophen 325 MG tablet Commonly known as: TYLENOL Take 1-2 tablets (325-650 mg total) by mouth every 4  (four) hours as needed for mild pain.   aspirin 325 MG tablet Place 1 tablet (325 mg total) into feeding tube daily.   clopidogrel 75 MG tablet Commonly known as: PLAVIX Place 1 tablet (75 mg total) into feeding tube daily.   gabapentin 100 MG capsule Commonly known as: NEURONTIN Take 1 capsule (100 mg total) by mouth 3 (three) times daily.   pantoprazole 40 MG tablet Commonly known as: PROTONIX Take 1 tablet (40 mg total) by mouth 2 (two) times daily.   saccharomyces boulardii 250 MG capsule Commonly known as: FLORASTOR Take 1 capsule (250 mg total) by mouth 2 (two) times daily.       Follow-up Information    Meredith Staggers, MD Follow up.   Specialty: Physical Medicine and Rehabilitation Why: Office to call for appointment Contact information: [address] [place] [address] [phone]        Consuella Lose, MD Follow up.   Specialty: Neurosurgery Why: Call for appointment Contact information: [address] [place] [address] [phone]        Thornton Park, MD Follow up.   Specialty: Gastroenterology Why: Call for appointment Contact information: [place] [address] [phone]  Signed: Bary Leriche 02/28/2020, 8:29 PM

## 2020-02-18 NOTE — Progress Notes (Signed)
Physical Therapy Session Note  Patient Details  Name: Erika Stanton MRN: [id number] Date of Birth: 07/29/1955  Today's Date: 02/18/2020 PT Individual Time: 1302-1400 PT Individual Time Calculation (min): 58 min   Short Term Goals: Week 4:  PT Short Term Goal 1 (Week 4): Pt will complete bed<>w/c consistently with CGA & LRAD. PT Short Term Goal 1 - Progress (Week 4): Progressing toward goal PT Short Term Goal 2 (Week 4): Pt will ambulate 50 ft with LRAD & CGA. PT Short Term Goal 2 - Progress (Week 4): Met PT Short Term Goal 3 (Week 4): Pt will negotiate 8 steps with LUE support & mod assist. PT Short Term Goal 3 - Progress (Week 4): Met Week 5:  PT Short Term Goal 1 (Week 5): STG = LTG due to estimated d/c date.  Skilled Therapeutic Interventions/Progress Updates:    Session focused on functional transfers, gait with RW with R hand orthosis, stair negotiation for NMR and d/c preparation for home entry, and NMR for balance re-training, tall kneeling, forced weightbearing through RUE, and dynamic gait with and without AD.   Pt's husband had brought in RW from home and trialled it today during session with the hand splint. Worked well and adjusted to patient, so they no longer need to order RW from DME company.  Pt request to use bathroom and was continent of urine. Min assist with RW with R hand orthosis for gait in and out of bathroom with occasional need for physical assist to steer RW over threshold. Impulsive in nature and cues needed for safety. Pt requires CGA to min assist for functional dynamic standing balance while performing clothing management. Required assistance with R side due to tighter fitting clothing per her report. Physical assist and cues for attention and management of RUE throughout session.  NMR for balance retraining and higher level dynamic gait through obstacles, stepping over simulated threshold, and gait over compliant surface with RW. CGA to min assist with  occasional needed assist for management of RW and cues for awareness and safety. In tall kneeling on mat table worked on trunk and postural control and RUE forced weightbearing to tolerance while performing functional reaching task with LUE. Min assist to get into the position to ascend and descend on the mat with bench in front of patient for support. Weightbearing through elbows for transition as well.   Stair negotiation x 4 steps with L rail and support on PT when descending with overall CGA to min assist with cues for technique (which foot to lead with) and hand placement. End of session performed short distance gait without device to challenge balance with min assist overall and repositioned back in the bed.   Education throughout on importance of RUE management and positioning during mobility. Pt making excellent progress overall.   Therapy Documentation Precautions:  Precautions Precautions: Fall Precaution Comments: NPO Restrictions Weight Bearing Restrictions: No    Pain: Reports intermittent pain in R hand during stretching/weightbearing. No intervention needed.    Therapy/Group: Individual Therapy  Gray, Alison Brescia  Alison B. Gray, PT, DPT, CBIS  02/18/2020, 2:15 PM  

## 2020-02-18 NOTE — Progress Notes (Signed)
Occupational Therapy Session Note  Patient Details  Name: Erika Stanton MRN: [id number] Date of Birth: 06/06/1955  Today's Date: 02/18/2020 OT Individual Time: 1000-1115 OT Individual Time Calculation (min): 75 min    Short Term Goals: Week 4:  OT Short Term Goal 1 (Week 4): STGs = LTGs  Skilled Therapeutic Interventions/Progress Updates:    Patient in bed, alert and ready for therapy session.  She request a shower this am, denies pain at this time, occ pain in right UE relieved with weight bearing/motor control activities to facilitate extensors and inhibition of flexors.  Supine to sit with CS.  Ambulation in room with RW (R hand assist) CS/CGA after prep to place hand.  toileting completed with CS/set up.  Shower completed seated on shower bench min/mod a overall- dependent to wash hair, assist for thoroughness with bilateral UEs and buttocks.  Dressing completed seated in arm chair.  Underwear/pants with min A, CM in stance min/mod A.  Bra max A, OH shirt mod A.   Completed UB motor control exercises in stance with CGA and cues for posture, improved shoulder control noted after inhibition techniques, reviewed left hand dexterity activities.  She continues with mild visual motor and inattention deficits - able to read exercises program with increased time (glasses on).  She remained seated in w/c at close of session.   Seat belt alarm set and call bell in hand.    Therapy Documentation Precautions:  Precautions Precautions: Fall Precaution Comments: NPO Restrictions Weight Bearing Restrictions: No  Therapy/Group: Individual Therapy  Carlos Levering 02/18/2020, 7:36 AM

## 2020-02-19 ENCOUNTER — Inpatient Hospital Stay (HOSPITAL_COMMUNITY): Payer: BC Managed Care – PPO | Admitting: Occupational Therapy

## 2020-02-19 ENCOUNTER — Inpatient Hospital Stay (HOSPITAL_COMMUNITY): Payer: BC Managed Care – PPO | Admitting: Physical Therapy

## 2020-02-19 ENCOUNTER — Inpatient Hospital Stay (HOSPITAL_COMMUNITY): Payer: BC Managed Care – PPO

## 2020-02-19 NOTE — Plan of Care (Signed)
  Problem: Consults Goal: RH BRAIN INJURY PATIENT EDUCATION Description: Description: See Patient Education module for eduction specifics Outcome: Progressing Goal: Skin Care Protocol Initiated - if Braden Score 18 or less Description: If consults are not indicated, leave blank or document N/A Outcome: Progressing Goal: Nutrition Consult-if indicated Outcome: Progressing Goal: Diabetes Guidelines if Diabetic/Glucose > 140 Description: If diabetic or lab glucose is > 140 mg/dl - Initiate Diabetes/Hyperglycemia Guidelines & Document Interventions  Outcome: Progressing   Problem: RH BOWEL ELIMINATION Goal: RH STG MANAGE BOWEL WITH ASSISTANCE Description: STG Manage Bowel with min Assistance. Outcome: Progressing Goal: RH STG MANAGE BOWEL W/MEDICATION W/ASSISTANCE Description: STG Manage Bowel with Medication with min Assistance. Outcome: Progressing   Problem: RH BLADDER ELIMINATION Goal: RH STG MANAGE BLADDER WITH ASSISTANCE Description: STG Manage Bladder With min Assistance Outcome: Progressing Goal: RH STG MANAGE BLADDER WITH MEDICATION WITH ASSISTANCE Description: STG Manage Bladder With Medication With min Assistance. Outcome: Progressing   Problem: RH SKIN INTEGRITY Goal: RH STG SKIN FREE OF INFECTION/BREAKDOWN Description: Skin to remain free from infection and breakdown with min assist while on CIR. Outcome: Progressing Goal: RH STG MAINTAIN SKIN INTEGRITY WITH ASSISTANCE Description: STG Maintain Skin Integrity With min Assistance. Outcome: Progressing Goal: RH STG ABLE TO PERFORM INCISION/WOUND CARE W/ASSISTANCE Description: STG Able To Perform Incision/Wound Care With min Assistance. Outcome: Progressing   Problem: RH SAFETY Goal: RH STG ADHERE TO SAFETY PRECAUTIONS W/ASSISTANCE/DEVICE Description: STG Adhere to Safety Precautions With min Assistance and appropriate assistive Device. Outcome: Progressing   Problem: RH COGNITION-NURSING Goal: RH STG USES MEMORY  AIDS/STRATEGIES W/ASSIST TO PROBLEM SOLVE Description: STG Uses Memory Aids/Strategies With min Assistance to Problem Solve. Outcome: Progressing Goal: RH STG ANTICIPATES NEEDS/CALLS FOR ASSIST W/ASSIST/CUES Description: STG Anticipates Needs/Calls for Assist With min Assistance/Cues. Outcome: Progressing   Problem: RH PAIN MANAGEMENT Goal: RH STG PAIN MANAGED AT OR BELOW PT'S PAIN GOAL Description: <3 on a 0-10 pain scale. Outcome: Progressing   Problem: RH KNOWLEDGE DEFICIT BRAIN INJURY Goal: RH STG INCREASE KNOWLEDGE OF SELF CARE AFTER BRAIN INJURY Description: Patient will demonstrate knowledge of medication management, diabetes management, dietary management, and follow up care with the MD post discharge with min assist from [place] staff. Outcome: Progressing Goal: RH STG INCREASE KNOWLEDGE OF DYSPHAGIA/FLUID INTAKE Description: Patient will be able to demonstrate knowledge of dysphagia diets, thickened liquids, and swallowing precautions with min assist from rehab staff. Outcome: Progressing   Problem: RH Vision Goal: RH LTG Vision (Specify) Outcome: Progressing

## 2020-02-19 NOTE — Progress Notes (Signed)
Occupational Therapy Session Note  Patient Details  Name: Erika Stanton MRN: [id number] Date of Birth: 01/30/1956  Today's Date: 02/19/2020 OT Individual Time: 1100-1215 OT Individual Time Calculation (min): 75 min    Short Term Goals: Week 4:  OT Short Term Goal 1 (Week 4): STGs = LTGs  Skilled Therapeutic Interventions/Progress Updates:    Pt seen this session for family education with her spouse Ronalee Belts with a focus on what therapy they can do at home together as he is concerned it will be a long wait for them to get into outpatient near her house.  Pt received in recliner and spent time with pt and her spouse in the room, in the gym and in the apartment on: -Sit to stand techniques from low recliner and couch -stand pivot transfer techniques if not using the Rw (in case of need for a fast transfer to the toilet_ -RUE NMR at large table for towel slides with A to promote active movement and tolerance to movement as pt continues to have a great deal of guarding and hesitancy to move her R arm - worked on supported movement to have pt avoid compenstation -functional activities of grasp and release with hand over hand guidance and forearm rotation  Demonstrated all of the exercises and then spouse did return demonstration.  Wrote down the exercises for pt and spouse to reference and provided her with 2 other levels of foam blocks to use 1 for L hand and 1 for R hand as she is developing thumb and finger prehension.    Spent quite a bit of time on BI education on neuroplasticity, the benefit of repetition and functional activities as spouse had numerous questions.  Reviewed pt's incredible progress and discussed all of her strengths as they are both worried her progress will become stagnant.  Reassured them she is continuing to progress at a rapid rate.    Pt taken back to room and spouse practiced having her take several steps to sit in recliner.  Pt in room with all needs met and spouse with  her.   Therapy Documentation Precautions:  Precautions Precautions: Fall Precaution Comments: NPO Restrictions Weight Bearing Restrictions: No    Vital Signs: Therapy Vitals Temp: 98.5 F (36.9 C) Pulse Rate: 85 Resp: 17 BP: 104/78 Patient Position (if appropriate): Lying Oxygen Therapy SpO2: 96 % Pain: Pain Assessment Pain Scale: 0-10 Pain Score: 0-No pain ADL: ADL Eating: NPO Grooming: Dependent Where Assessed-Grooming: Bed level Upper Body Bathing: Maximal assistance Where Assessed-Upper Body Bathing: Shower Lower Body Bathing: Maximal assistance Where Assessed-Lower Body Bathing: Shower Upper Body Dressing: Maximal assistance Where Assessed-Upper Body Dressing: Wheelchair Lower Body Dressing: Maximal assistance Where Assessed-Lower Body Dressing: Wheelchair, Standing at sink, Sitting at sink Toileting: Dependent Where Assessed-Toileting: Bed level Social research officer, government: Minimal assistance Social research officer, government Method: Stand pivot ADL Comments: patient able to hold wash cloth with left hand but unable to manipulate for washing, she is able to lift each leg for donning pants/socks   Therapy/Group: Individual Therapy  [place] 02/19/2020, 8:52 AM

## 2020-02-19 NOTE — Progress Notes (Addendum)
Patient ID: Erika Stanton, female   DOB: April 07, 1956, 64 y.o.   MRN: [id number]  Spoke with MD who reports [facility] OP is backed up and will take weeks to get into. Discussed with pt trying [facility] she is agreeable, have faxed order to them. Asked if could make appointment and told will contact family to set up appointment, made aware would ike ASAP.  12:30 PM Met with husband to discuss [facility] script faxed to facility they will be calling him to set up appointments. His only concern is that it is not too far between when she goes home and her appointment.Have asked they schedule ASAP

## 2020-02-19 NOTE — Progress Notes (Signed)
Physical Therapy Session Note  Patient Details  Name: Erika Stanton MRN: [id number] Date of Birth: May 14, 1956  Today's Date: 02/19/2020 PT Individual Time: 1300-1415 PT Individual Time Calculation (min): 75 min   Short Term Goals: Week 1:  PT Short Term Goal 1 (Week 1): Pt will demonstrate static sit consistently w/min assist PT Short Term Goal 1 - Progress (Week 1): Not met PT Short Term Goal 2 (Week 1): pt will perform STS w/mod assist of 2 PT Short Term Goal 2 - Progress (Week 1): Not met PT Short Term Goal 3 (Week 1): Pt will ambulate 107f w/LRAD and max assist of 2 PT Short Term Goal 3 - Progress (Week 1): Not met PT Short Term Goal 4 (Week 1): Pt will tolerate passive cervical rotation to R 20* PT Short Term Goal 4 - Progress (Week 1): Not met PT Short Term Goal 5 (Week 1): Pt will tolerate 1-2 hrs OOB in wc PT Short Term Goal 5 - Progress (Week 1): Met Week 2:  PT Short Term Goal 1 (Week 2): Pt will complete bed<>w/c with max assist +1 consistently. PT Short Term Goal 1 - Progress (Week 2): Met PT Short Term Goal 2 (Week 2): Pt will demonstrate static sitting balance x 5 minutes with min assist. PT Short Term Goal 2 - Progress (Week 2): Met PT Short Term Goal 3 (Week 2): Pt will consistently complete bed mobility with max assist +1. PT Short Term Goal 3 - Progress (Week 2): Met Week 3:  PT Short Term Goal 1 (Week 3): Pt will perform supine<>sit with CGA PT Short Term Goal 1 - Progress (Week 3): Met PT Short Term Goal 2 (Week 3): Pt will perform sit<>stands using LRAD with CGA PT Short Term Goal 2 - Progress (Week 3): Progressing toward goal PT Short Term Goal 3 (Week 3): Pt will perform bed<>chair transfers using LRAD with CGA PT Short Term Goal 3 - Progress (Week 3): Progressing toward goal PT Short Term Goal 4 (Week 3): Pt will ambulate at least 245fusing LRAD with min assist PT Short Term Goal 4 - Progress (Week 3): Met PT Short Term Goal 5 (Week 3): Pt will  ascend/descend 4 steps using HRs with mod assist PT Short Term Goal 5 - Progress (Week 3): Met  Skilled Therapeutic Interventions/Progress Updates:    PAIN c/o RUE w/wbing activities and unsupported w/gait, created sling w/gait belt to provide  Husband initially in room, states he had a good session w/OT, but that he "needed a break" and left at this time.   STS from recliner w/cga.  SPT to wc w/cga Pt transported to gym for continued session Gait [id number] device w/cues to increase base, cga.  Balance: Standing floor clocks performed w/cga to light min assist, performed w/bilat Les.  Standing tapping 8in step alternating feet x 5 each.  Standing on foam - increased sway, cga Standing on foam  W/slow nods and cervical rotation - cga to light min assist due to increased sway Standing w/feet together, semitandem R/L and L/R min to cga   STS/turn/stand to tall kneeling using bench and min assist from therapist. In tall kneeling, placed bilat UEs in wbing on bench.  Performed repeated LUE overhead reach w/cga x 6 reps. Sidestepping in tall kneeling w/UEs on bench and cga, performed for core activation/strengthening, length of bench x 6. Tall kneeling to stand w/max assist for RLE backing.   Gait [id number] device w/cga, self cues to increase base  of support, good cadence.  Backing   Attempted standing hip extension, but pt c/o R knee pain , defaulted to mat: Supine bridge w/marching x 3 on L while maintaining brige w/R x 8 reps total for hamstring/glut strength.   Husband arrived at end of session. SPT to wc w/cga.  Pt transported back to room.  Husband requested PT assist pt back to bed citing "needing a break" SPT to bed w/cga.  Sit to supine w/cues and cga.  Scoots w/max cues for sequencing. Pt left supine w/rails up x 3, alarm set, bed in lowest position, and needs in reach, husband at bedside.   Therapy Documentation Precautions:  Precautions Precautions: Fall Precaution  Comments: NPO Restrictions Weight Bearing Restrictions: No    Therapy/Group: Individual Therapy  Callie Fielding, PT   Jerrilyn Cairo 02/19/2020, 4:02 PM

## 2020-02-19 NOTE — Progress Notes (Signed)
Physical Therapy Session Note  Patient Details  Name: Erika Stanton MRN: [id number] Date of Birth: [birth year]  Today's Date: 02/19/2020 PT Individual Time: 0803-0902 PT Individual Time Calculation (min): 59 min   Short Term Goals: Week 5:  PT Short Term Goal 1 (Week 5): STG = LTG due to estimated d/c date.  Skilled Therapeutic Interventions/Progress Updates:  Pt received in chair finishing breakfast. Pt eats last few bites, declining PT placing plate guard on plate. Sit<>stand from chair with supervision & pt able to manage RUE on hand orthosis with extra time without physical assist. Gait in room/bathroom with CGA<>close supervision with use of RW. Pt performs toilet transfer with supervision, continent void on toilet, & peri hygiene without assist. Pt does require assist to pull pants up over R hip, otherwise no assist with clothing management. Hand hygiene at sink with supervision for standing balance. Gait room>apartment>dayroom>room with RW & supervision with min cuing for increased step width (L>RLE) with good return demo. Pt completes transfer from rocking recliner with min assist for sit>stand to simulate furniture at home. In dayroom, pt utilized kinetron in sitting then standing with BUE support for max duration of 1 minute with task focusing on standing balance, weight shifting L<>R, and LE NMR. At end of session pt left in bed with alarm set, call bell in reach.  Therapy Documentation Precautions:  Precautions Precautions: Fall Precaution Comments: NPO Restrictions Weight Bearing Restrictions: No  Pain: C/o intermittent RUE pain when positioning on hand orthosis - repositioning provided for increased comfort   Therapy/Group: Individual Therapy  [name] 02/19/2020, 9:09 AM

## 2020-02-19 NOTE — Progress Notes (Signed)
[facility] PHYSICAL MEDICINE & REHABILITATION PROGRESS NOTE   Subjective/Complaints: No new complaints. Sleeping well. Right arm feels better. Husband had questions about outpt therapies  ROS: Patient denies fever, rash, sore throat, blurred vision, nausea, vomiting, diarrhea, cough, shortness of breath or chest pain, joint or back pain, headache, or mood change.     Objective:   No results found. No results for input(s): WBC, HGB, HCT, PLT in the last 72 hours. No results for input(s): NA, K, CL, CO2, GLUCOSE, BUN, CREATININE, CALCIUM in the last 72 hours.  Intake/Output Summary (Last 24 hours) at 02/19/2020 1250 Last data filed at 02/19/2020 0815 Gross per 24 hour  Intake 478 ml  Output --  Net 478 ml     Physical Exam: Vital Signs Blood pressure 104/78, pulse 85, temperature 98.5 F (36.9 C), resp. rate 17, height 5\' 6"  (1.676 m), weight 89.5 kg, SpO2 96 %.  Constitutional: No distress . Vital signs reviewed. HEENT: EOMI, oral membranes moist Neck: supple Cardiovascular: RRR without murmur. No JVD    Respiratory/Chest: CTA Bilaterally without wheezes or rales. Normal effort    GI/Abdomen: BS +, non-tender, non-distended Ext: no clubbing, cyanosis, or edema Psych: pleasant and cooperative Neurologic:LUQ-anopsia still present. EOMI. Good speech quality. Improved insight and awareness. Left CN VII.    2+ bicep, triceps/ 1+ WE, trace finger flex/ext RUE , 4/5 RLE,  LUE 4-/5 , LLE 4/5. Right arm is less sensitive to touch with decreased LT. Musculoskeletal: Right arm remains less tender to PROM Skin: bruises slowly resolving     Assessment/Plan: 1. 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  Physiatrist is providing close team supervision and 24 hour management of active medical problems listed below.  Physiatrist and rehab team continue to  assess barriers to discharge/monitor patient progress toward functional and medical goals  Care Tool:  Bathing    Body parts bathed by patient: Front perineal area, Abdomen, Left upper leg, Right upper leg, Chest, Right lower leg, Face, Left lower leg   Body parts bathed by helper: Right arm, Left arm, Buttocks, Left lower leg     Bathing assist Assist Level: Moderate Assistance - Patient 50 - 74%     Upper Body Dressing/Undressing Upper body dressing   What is the patient wearing?: Pull over shirt, Bra    Upper body assist Assist Level: Moderate Assistance - Patient 50 - 74%    Lower Body Dressing/Undressing Lower body dressing      What is the patient wearing?: Underwear/pull up, Pants     Lower body assist Assist for lower body dressing: Minimal Assistance - Patient > 75%     Toileting Toileting    Toileting assist Assist for toileting: Moderate Assistance - Patient 50 - 74%     Transfers Chair/bed transfer  Transfers assist     Chair/bed transfer assist level: Contact Guard/Touching assist     Locomotion Ambulation   Ambulation assist      Assist level: Supervision/Verbal cueing Assistive device: Walker-rolling Max distance: 150   Walk 10 feet activity   Assist     Assist level: Supervision/Verbal cueing Assistive device: Walker-rolling   Walk 50 feet activity   Assist Walk 50 feet with 2 turns activity did not occur: Safety/medical concerns  Assist level: Supervision/Verbal cueing Assistive device: Walker-rolling    Walk 150 feet activity   Assist Walk 150 feet activity did not occur:  Safety/medical concerns  Assist level: Supervision/Verbal cueing Assistive device: Walker-rolling    Walk 10 feet on uneven surface  activity   Assist Walk 10 feet on uneven surfaces activity did not occur: Safety/medical concerns   Assist level: Minimal Assistance - Patient > 75% Assistive device: Horticulturist, commercial Will patient use wheelchair at discharge?: Yes Type of Wheelchair: Manual           Wheelchair 50 feet with 2 turns activity    Assist    Wheelchair 50 feet with 2 turns activity did not occur: Safety/medical concerns (requires TIS for postural support)       Wheelchair 150 feet activity     Assist  Wheelchair 150 feet activity did not occur: Safety/medical concerns   Assist Level: Supervision/Verbal cueing   Blood pressure 104/78, pulse 85, temperature 98.5 F (36.9 C), resp. rate 17, height 5\' 6"  (1.676 m), weight 89.5 kg, SpO2 96 %.  Medical Problem List and Plan: 1.  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  Continue CIR, PT, OT, SLP     -making progressive in all areas  -10/8 spoke with husband and patient about discharge therapies. Appears to be a long wait at [facility] and [place]. Will refer to [facility] for appointment. May need home health therapies initially to cover the gap until her first outpt visit. Will await word from SW. 2.  Antithrombotics: -DVT/anticoagulation:  Pharmaceutical: Heparin--hold                          Dopplers negative for DVT.              -antiplatelet therapy: DAPT--resumed plavix, asa without problems   3. Pain Management: Tylenol prn.    -neuropathic pain RUE   -10/1 right arm much improved. Continue same gabapentin 100mg  TID  Right arm subluxation- continue support with pillow/lap tray. She continues to see return in the RUE which is encouraging.  4. Mood: LCSW to follow for evaluation and support when appropriate.              -antipsychotic agents: N/A 5. Neuropsych: This patient is not capable of making decisions on her own behalf.  -arousal and initiation much better. Ritalin stopped. 6. Skin/Wound Care: routine pressure relief measures.   7.  Fluids/Electrolytes/Nutrition:   -on D3/thins, eating 70-90%!!!  -recheck CMET 10/11, pt would like to stop potassium if possible  -I reduced potassium to 37meq daily starting 10/8   8. ABLA/UGIB:     EGD 9/15 apparently demonstrated non-bleeding esophagitis/gastritis, HH   -husband doesn't want to pursue further testing  9/17: resumed plavix/asa. Will not resume sq heparin   10/1 continue protonix  10/8 recheck cbc Monday 9. 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 Resting tachycardia:              well controlled 12. Vasospasm prevention: nimotop completed 13. Bowel incontinence/loose stool:   -fiber/probiotic  -stools now formed  14. Proteus UTI/enteroccus UTI/urine retention: Ceftriaxone course completed for proteus  -amoxil x 5 days for enterococcus  completed 9/24  -10/8 dc flomax 18.  Prediabetes,    Relatively controlled. dc'ed  CBG checks          LOS: 30 days A FACE TO FACE EVALUATION WAS PERFORMED  Meredith Staggers 02/19/2020, 12:50 PM

## 2020-02-20 ENCOUNTER — Inpatient Hospital Stay (HOSPITAL_COMMUNITY): Payer: BC Managed Care – PPO | Admitting: Physical Therapy

## 2020-02-20 NOTE — Progress Notes (Addendum)
Physical Therapy Session Note  Patient Details  Name: Erika Stanton MRN: [id number] Date of Birth: 02/21/1956  Today's Date: 02/20/2020 PT Individual Time: 0707-0803 and 3546-5681 PT Individual Time Calculation (min): 56 min and 57 min  Short Term Goals: Week 5:  PT Short Term Goal 1 (Week 5): STG = LTG due to estimated d/c date.   Skilled Therapeutic Interventions/Progress Updates:  Treatment 1: Pt received in bed & agreeable to tx. Supine>sit with supervision & extra time. PT assists pt with donning B shoes. Sit<>stand throughout session from various surfaces (bed in room, bed in apartment) with supervision, except min assist from low compliant couch in apartment. Pt ambulates in room & bathroom with RW & supervision, requires min assist for clothing management over R hips & completes toilet transfer with supervision. Pt with continent void on toilet, performing peri hygiene without assistance. Pt requires cuing to square up to sink with RW & performs hand hygiene with assist to dry L hand. Gait room>apartment>dayroom>room with RW & supervision with min cuing for increased step width & obstacle avoidance with RW. Pt completes bed mobility in apartment with supervision to simulate bed at home. Pt utilized nu-step on level 4 x 10 with BLE & LUE with frequent rest breaks to consume water; activity focused on global strengthening & endurance training, RLE NMR. At end of session pt left in recliner with chair alarm donned, call bell & all needs in reach. During session pt does inquire about gait without AD & PT continues to educate pt on need to use RW for all mobility for increased safety & balance with pt voicing understanding.  Pain: pt c/o R hand discomfort with use of hand orthosis - PT attempts to assist pt with repositioning for increased comfort  Treatment 2: Pt received in room with [name]) present for session. Erika Stanton very frustrated & voicing concerns re: not knowing how to assist pt  with changing shirt, unaware of BUE HEP, lag time between d/c from CIR & beginning of OPPT & other various things. PT provides education & problem solving for assisting pt with changing shirts with PT ultimately educating pt to pull shirt over head then unthread BUE with PT assisting. Erika Stanton then assists pt with donning new shirt with cuing for compensatory technique from PT but Erika Stanton becoming visibly & verbally frustrated with task but able to assist pt with extra time. PT showed pt & husband CIR notebook with upper body exercises in them with pt recalling remembering them but husband does not - left note for OT to review HEP with them next week. Pt's husband assists her with gait room<>gym with recommendation to use gait belt & for proper hand positioning but poor return demo with Erika Stanton reporting it feels "more natural" with palm down vs up. Pt utilizes RW with R hand orthosis and Erika Stanton providing CGA for gait. Pt negotiates 4 steps with L ascending rail, holds to Gap when descending with min assist with PT providing ongoing cuing for compensatory sequencing and education re: pt's ability to hold to rail with RUE to steady herself but pt & husband unable to place hand on rail. Pt does experience 1 mild LOB with Erika Stanton able to assist with recovery. Erika Stanton reports he doesn't feel the need to practice gait over uneven surfaces even though pt will negotiate grass/gravel/stepping stones from car>house. PT sets up mat with bean bags underneath to simulate uneven surface & encourages them to practice. Erika Stanton reports he will not have pt use  RW during this activity at home despite previous education & education today re: increased balance with AD. Erika Stanton assists pt with gait over uneven surface multiple times without AD & min assist with cuing from PT to stand closer to pt. At end of session pt left sitting in chair with Erika Stanton in room. PT reviewed setting bed & chair alarm with Erika Stanton voicing understanding & PT educating him on need for  alarm to be set prior to his exit.  Pain: c/o unrated pain in R hand - nurse administered pain meds during session.  Therapy Documentation Precautions:  Precautions Precautions: Fall Precaution Comments: NPO Restrictions Weight Bearing Restrictions: No    Therapy/Group: Individual Therapy  Erika Stanton 02/20/2020, 3:11 PM

## 2020-02-20 NOTE — Progress Notes (Signed)
[place] PHYSICAL MEDICINE & REHABILITATION PROGRESS NOTE   Subjective/Complaints:   Pt reports particular issues- tylenol helps when has pain- shoulder and fingers- helps if not really bad.   R shoulder taped- Ktape.   ROS:  Pt denies SOB, abd pain, CP, N/V/C/D, and vision changes   Objective:   No results found. No results for input(s): WBC, HGB, HCT, PLT in the last 72 hours. No results for input(s): NA, K, CL, CO2, GLUCOSE, BUN, CREATININE, CALCIUM in the last 72 hours.  Intake/Output Summary (Last 24 hours) at 02/20/2020 1420 Last data filed at 02/19/2020 2200 Gross per 24 hour  Intake 357 ml  Output --  Net 357 ml     Physical Exam: Vital Signs Blood pressure 108/65, pulse 72, temperature 97.6 F (36.4 C), temperature source Oral, resp. rate 18, height 5\' 6"  (1.676 m), weight 89.5 kg, SpO2 94 %.  Constitutional: No distress . Vital signs reviewed. Sitting up in bed; watching TV, NAD HEENT: EOMI, oral membranes moist Neck: supple Cardiovascular: RRR    Respiratory/Chest: CTA B/L- no W/R/R- good air movement  GI/Abdomen: Soft, NT, ND, (+)BS  Ext: no clubbing, cyanosis, or edema Psych: pleasant and cooperative Neurologic:LUQ-anopsia still present. EOMI. Good speech quality. Improved insight and awareness. Left CN VII.    2+ bicep, triceps/ 1+ WE, trace finger flex/ext RUE , 4/5 RLE,  LUE 4-/5 , LLE 4/5. Right arm is less sensitive to touch with decreased LT. Musculoskeletal: Right arm remains less tender to PROM- R shoulder taped.  Skin: bruises slowly resolving     Assessment/Plan: 1. 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  Physiatrist is providing close team supervision and 24 hour management of active medical problems listed below.  Physiatrist and rehab team continue to assess barriers to discharge/monitor patient  progress toward functional and medical goals  Care Tool:  Bathing    Body parts bathed by patient: Front perineal area, Abdomen, Left upper leg, Right upper leg, Chest, Right lower leg, Face, Left lower leg   Body parts bathed by helper: Right arm, Left arm, Buttocks, Left lower leg     Bathing assist Assist Level: Moderate Assistance - Patient 50 - 74%     Upper Body Dressing/Undressing Upper body dressing   What is the patient wearing?: Pull over shirt, Bra    Upper body assist Assist Level: Moderate Assistance - Patient 50 - 74%    Lower Body Dressing/Undressing Lower body dressing      What is the patient wearing?: Underwear/pull up, Pants     Lower body assist Assist for lower body dressing: Minimal Assistance - Patient > 75%     Toileting Toileting    Toileting assist Assist for toileting: Moderate Assistance - Patient 50 - 74%     Transfers Chair/bed transfer  Transfers assist     Chair/bed transfer assist level: Contact Guard/Touching assist     Locomotion Ambulation   Ambulation assist      Assist level: Supervision/Verbal cueing Assistive device: Walker-rolling Max distance: 150   Walk 10 feet activity   Assist     Assist level: Supervision/Verbal cueing Assistive device: Walker-rolling   Walk 50 feet activity   Assist Walk 50 feet with 2 turns activity did not occur: Safety/medical concerns  Assist level: Supervision/Verbal cueing Assistive device: Walker-rolling    Walk 150 feet activity   Assist Walk 150 feet activity did  not occur: Safety/medical concerns  Assist level: Supervision/Verbal cueing Assistive device: Walker-rolling    Walk 10 feet on uneven surface  activity   Assist Walk 10 feet on uneven surfaces activity did not occur: Safety/medical concerns   Assist level: Minimal Assistance - Patient > 75% Assistive device: Aeronautical engineer Will patient use wheelchair at  discharge?: Yes Type of Wheelchair: Manual           Wheelchair 50 feet with 2 turns activity    Assist    Wheelchair 50 feet with 2 turns activity did not occur: Safety/medical concerns (requires TIS for postural support)       Wheelchair 150 feet activity     Assist  Wheelchair 150 feet activity did not occur: Safety/medical concerns   Assist Level: Supervision/Verbal cueing   Blood pressure 108/65, pulse 72, temperature 97.6 F (36.4 C), temperature source Oral, resp. rate 18, height 5\' 6"  (1.676 m), weight 89.5 kg, SpO2 94 %.  Medical Problem List and Plan: 1.  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  Continue CIR, PT, OT, SLP     -making progressive in all areas  -10/8 spoke with husband and patient about discharge therapies. Appears to be a long wait at [facility] and [place]. Will refer to [facility] for appointment. May need home health therapies initially to cover the gap until her first outpt visit. Will await word from SW. 2.  Antithrombotics: -DVT/anticoagulation:  Pharmaceutical: Heparin--hold                          Dopplers negative for DVT.              -antiplatelet therapy: DAPT--resumed plavix, asa without problems   3. Pain Management: Tylenol prn.    -neuropathic pain RUE   -10/1 right arm much improved. Continue same gabapentin 100mg  TID  Right arm subluxation- continue support with pillow/lap tray. She continues to see return in the RUE which is encouraging.  10/9- R shoulder taped- helping  4. Mood: LCSW to follow for evaluation and support when appropriate.              -antipsychotic agents: N/A 5. Neuropsych: This patient is not capable of making decisions on her own behalf.  -arousal and initiation much better. Ritalin stopped. 6. Skin/Wound Care: routine pressure relief measures.   7.  Fluids/Electrolytes/Nutrition:   -on D3/thins, eating 70-90%!!!  -recheck CMET 10/11, pt would like to stop potassium if possible  -I reduced potassium to 56meq daily starting 10/8  10/9- labs Monday 8. ABLA/UGIB:     EGD 9/15 apparently demonstrated non-bleeding esophagitis/gastritis, HH   -husband doesn't want to pursue further testing  9/17: resumed plavix/asa. Will not resume sq heparin   10/1 continue protonix  10/8 recheck cbc Monday 9. 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 Resting tachycardia:              well controlled 12. Vasospasm prevention: nimotop completed 13. Bowel incontinence/loose stool:   -fiber/probiotic  -stools now formed  14. Proteus UTI/enteroccus UTI/urine retention: Ceftriaxone course completed for proteus  -amoxil x 5 days for enterococcus  completed 9/24  -10/8 dc flomax 18.  Prediabetes,    Relatively controlled. dc'ed  CBG checks          LOS: 31 days A  FACE TO FACE EVALUATION WAS PERFORMED  Erika Stanton 02/20/2020, 2:20 PM

## 2020-02-21 NOTE — Progress Notes (Signed)
[place] PHYSICAL MEDICINE & REHABILITATION PROGRESS NOTE   Subjective/Complaints:   Pt reports OK right now- R shoulder has "usual" pain, but better with tylenol.   ROS:   Pt denies SOB, abd pain, CP, N/V/C/D, and vision changes  Objective:   No results found. No results for input(s): WBC, HGB, HCT, PLT in the last 72 hours. No results for input(s): NA, K, CL, CO2, GLUCOSE, BUN, CREATININE, CALCIUM in the last 72 hours.  Intake/Output Summary (Last 24 hours) at 02/21/2020 1435 Last data filed at 02/21/2020 0730 Gross per 24 hour  Intake 656 ml  Output --  Net 656 ml     Physical Exam: Vital Signs Blood pressure 110/70, pulse 80, temperature 97.6 F (36.4 C), resp. rate 14, height 5\' 6"  (1.676 m), weight 89.5 kg, SpO2 100 %.  Constitutional: sitting up/slightly, in bed, appropriate, NAD HEENT: EOMI, oral membranes moist Neck: supple Cardiovascular: RRR  Respiratory/Chest: CTA B/L- no W/R/R- good air movement  GI/Abdomen: Soft, NT, ND, (+)BS  Ext: no clubbing, cyanosis, or edema Psych: pleasant and cooperative still Neurologic:LUQ-anopsia still present. EOMI. Good speech quality. Improved insight and awareness. Left CN VII.    2+ bicep, triceps/ 1+ WE, trace finger flex/ext RUE , 4/5 RLE,  LUE 4-/5 , LLE 4/5. Right arm is less sensitive to touch with decreased LT. Musculoskeletal: Right arm remains less tender to PROM- R shoulder taped with kineseotaping.  Skin: bruises slowly resolving     Assessment/Plan: 1. 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  Physiatrist is providing close team supervision and 24 hour management of active medical problems listed below.  Physiatrist and rehab team continue to assess barriers to discharge/monitor patient progress toward functional and medical goals  Care Tool:  Bathing    Body parts  bathed by patient: Front perineal area, Abdomen, Left upper leg, Right upper leg, Chest, Right lower leg, Face, Left lower leg   Body parts bathed by helper: Right arm, Left arm, Buttocks, Left lower leg     Bathing assist Assist Level: Moderate Assistance - Patient 50 - 74%     Upper Body Dressing/Undressing Upper body dressing   What is the patient wearing?: Pull over shirt, Bra    Upper body assist Assist Level: Moderate Assistance - Patient 50 - 74%    Lower Body Dressing/Undressing Lower body dressing      What is the patient wearing?: Underwear/pull up, Pants     Lower body assist Assist for lower body dressing: Minimal Assistance - Patient > 75%     Toileting Toileting    Toileting assist Assist for toileting: Moderate Assistance - Patient 50 - 74%     Transfers Chair/bed transfer  Transfers assist     Chair/bed transfer assist level: Contact Guard/Touching assist     Locomotion Ambulation   Ambulation assist      Assist level: Supervision/Verbal cueing Assistive device: Walker-rolling Max distance: 150   Walk 10 feet activity   Assist     Assist level: Supervision/Verbal cueing Assistive device: Walker-rolling   Walk 50 feet activity   Assist Walk 50 feet with 2 turns activity did not occur: Safety/medical concerns  Assist level: Supervision/Verbal cueing Assistive device: Walker-rolling    Walk 150 feet activity   Assist Walk 150 feet activity did not occur: Safety/medical concerns  Assist level: Supervision/Verbal cueing Assistive device: Walker-rolling    Walk 10 feet  on uneven surface  activity   Assist Walk 10 feet on uneven surfaces activity did not occur: Safety/medical concerns   Assist level: Minimal Assistance - Patient > 75% Assistive device: Aeronautical engineer Will patient use wheelchair at discharge?: Yes Type of Wheelchair: Manual           Wheelchair 50 feet with 2 turns  activity    Assist    Wheelchair 50 feet with 2 turns activity did not occur: Safety/medical concerns (requires TIS for postural support)       Wheelchair 150 feet activity     Assist  Wheelchair 150 feet activity did not occur: Safety/medical concerns   Assist Level: Supervision/Verbal cueing   Blood pressure 110/70, pulse 80, temperature 97.6 F (36.4 C), resp. rate 14, height 5\' 6"  (1.676 m), weight 89.5 kg, SpO2 100 %.  Medical Problem List and Plan: 1.  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  Continue CIR, PT, OT, SLP     -making progressive in all areas  -10/8 spoke with husband and patient about discharge therapies. Appears to be a long wait at [facility] and [place]. Will refer to [facility] for appointment. May need home health therapies initially to cover the gap until her first outpt visit. Will await word from SW. 2.  Antithrombotics: -DVT/anticoagulation:  Pharmaceutical: Heparin--hold                          Dopplers negative for DVT.              -antiplatelet therapy: DAPT--resumed plavix, asa without problems   3. Pain Management: Tylenol prn.    -neuropathic pain RUE   -10/1 right arm much improved. Continue same gabapentin 100mg  TID  Right arm subluxation- continue support with pillow/lap tray. She continues to see return in the RUE which is encouraging.  101/10 taping of R shoulder and tylenol prn- con't regimen  4. Mood: LCSW to follow for evaluation and support when appropriate.              -antipsychotic agents: N/A 5. Neuropsych: This patient is not capable of making decisions on her own behalf.  -arousal and initiation much better. Ritalin stopped. 6. Skin/Wound Care: routine pressure relief measures.   7. Fluids/Electrolytes/Nutrition:   -on D3/thins, eating 70-90%!!!  -recheck CMET 10/11, pt would  like to stop potassium if possible  -I reduced potassium to 37meq daily starting 10/8 10/10- labs Monday  8. ABLA/UGIB:     EGD 9/15 apparently demonstrated non-bleeding esophagitis/gastritis, HH   -husband doesn't want to pursue further testing  9/17: resumed plavix/asa. Will not resume sq heparin   10/1 continue protonix  10/10- CBC Monday 9. 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 Resting tachycardia:              well controlled 12. Vasospasm prevention: nimotop completed 13. Bowel incontinence/loose stool:   -fiber/probiotic  -stools now formed  14. Proteus UTI/enteroccus UTI/urine retention: Ceftriaxone course completed for proteus  -amoxil x 5 days for enterococcus  completed 9/24  -10/8 dc flomax 18.  Prediabetes,    Relatively controlled. dc'ed  CBG checks          LOS: 32 days A FACE TO FACE EVALUATION WAS PERFORMED  Erika Stanton 02/21/2020, 2:35 PM

## 2020-02-22 ENCOUNTER — Inpatient Hospital Stay (HOSPITAL_COMMUNITY): Payer: BC Managed Care – PPO

## 2020-02-22 ENCOUNTER — Inpatient Hospital Stay (HOSPITAL_COMMUNITY): Payer: BC Managed Care – PPO | Admitting: Occupational Therapy

## 2020-02-22 ENCOUNTER — Inpatient Hospital Stay (HOSPITAL_COMMUNITY): Payer: BC Managed Care – PPO | Admitting: Speech Pathology

## 2020-02-22 LAB — BASIC METABOLIC PANEL
Anion gap: 11 (ref 5–15)
BUN: 5 mg/dL — ABNORMAL LOW (ref 8–23)
CO2: 25 mmol/L (ref 22–32)
Calcium: 9.1 mg/dL (ref 8.9–10.3)
Chloride: 107 mmol/L (ref 98–111)
Creatinine, Ser: 0.53 mg/dL (ref 0.44–1.00)
GFR, Estimated: >60 mL/min (ref >60)
Glucose, Bld: 96 mg/dL (ref 70–99)
Potassium: 3.8 mmol/L (ref 3.5–5.1)
Sodium: 143 mmol/L (ref 135–145)

## 2020-02-22 LAB — CBC
HCT: 37.5 % (ref 36.0–46.0)
Hemoglobin: 11.6 g/dL — ABNORMAL LOW (ref 12.0–15.0)
MCH: 29.5 pg (ref 26.0–34.0)
MCHC: 30.9 g/dL (ref 30.0–36.0)
MCV: 95.4 fL (ref 80.0–100.0)
Platelets: 344 10*3/uL (ref 150–400)
RBC: 3.93 MIL/uL (ref 3.87–5.11)
RDW: 12.4 % (ref 11.5–15.5)
WBC: 4.9 10*3/uL (ref 4.0–10.5)
nRBC: 0 % (ref 0.0–0.2)

## 2020-02-22 MED ORDER — PANTOPRAZOLE SODIUM 40 MG PO TBEC: 40.0000 mg | DELAYED_RELEASE_TABLET | Freq: Two times a day (BID) | ORAL | 0 refills | Status: DC

## 2020-02-22 MED ORDER — ACETAMINOPHEN 325 MG PO TABS: 325.0000 mg | ORAL_TABLET | ORAL | Status: AC | PRN

## 2020-02-22 MED ORDER — SACCHAROMYCES BOULARDII 250 MG PO CAPS: 250.0000 mg | ORAL_CAPSULE | Freq: Two times a day (BID) | ORAL | 0 refills | Status: DC

## 2020-02-22 MED ORDER — CLOPIDOGREL BISULFATE 75 MG PO TABS: 75.0000 mg | ORAL_TABLET | Freq: Every day | ORAL | 2 refills | Status: DC

## 2020-02-22 MED ORDER — GABAPENTIN 100 MG PO CAPS: 100.0000 mg | ORAL_CAPSULE | Freq: Three times a day (TID) | ORAL | 0 refills | Status: DC

## 2020-02-22 NOTE — Progress Notes (Signed)
Occupational Therapy Discharge Summary  Patient Details  Name: Erika Stanton MRN: [id number] Date of Birth: Sep 18, 1955     Patient has met 11 of 12 long term goals due to improved activity tolerance, improved balance, postural control, ability to compensate for deficits, functional use of  RIGHT upper and LEFT upper extremity, improved attention, improved awareness and improved coordination.  Patient to discharge at [facility] Assist level.  Patient's care partner is independent to provide the necessary physical and cognitive assistance at discharge.    Reasons goals not met: UB dressing continues to require mod A due to limited use of right UE and flexor tightness  Recommendation:  Patient will benefit from ongoing skilled OT services in outpatient setting to continue to advance functional skills in the area of BADL and iADL.  Equipment: family purchased tub bench  Reasons for discharge: treatment goals met  Patient/family agrees with progress made and goals achieved: Yes  OT Discharge Precautions/Restrictions  Precautions Precautions: Fall Restrictions Weight Bearing Restrictions: No ADL ADL Eating: Supervision/safety - provided plate guard Where Assessed-Eating: Chair Grooming: Minimal assistance, Other (comment) (set up for washing face, oral care but dependent for hair care due to knots/matted hair) Where Assessed-Grooming: Standing at sink Upper Body Bathing: Minimal assistance Where Assessed-Upper Body Bathing: Shower Lower Body Bathing: Minimal assistance Where Assessed-Lower Body Bathing: Shower Upper Body Dressing: Moderate assistance Where Assessed-Upper Body Dressing: Chair Lower Body Dressing: Minimal assistance Where Assessed-Lower Body Dressing: Chair Toileting: Minimal assistance Where Assessed-Toileting: Horticulturist, commercial, Close supervision Toilet Transfer Method: Ambulating Tub/Shower Transfer: Metallurgist  Method: Optometrist: Facilities manager: Curator Method: Heritage manager: Psychiatrist Baseline Vision/History: Wears glasses Vision Assessment?: Yes Eye Alignment: Within Functional Limits Ocular Range of Motion: Within Functional Limits Alignment/Gaze Preference: Within Defined Limits Tracking/Visual Pursuits: Decreased smoothness of horizontal tracking;Decreased smoothness of vertical tracking Saccades: Decreased speed of saccadic movement Visual Fields: No apparent deficits Additional Comments: able to locate and read items in environment but requires increased time Perception  Perception: Impaired Inattention/Neglect:  (decreased to RUE) Praxis Praxis: Impaired Cognition Overall Cognitive Status: Impaired/Different from baseline Arousal/Alertness: Awake/alert Orientation Level: Oriented X4 Attention: Selective Sustained Attention: Appears intact Problem Solving: Impaired Safety/Judgment: Impaired Sensation Sensation Additional Comments: hypersensitive on right side Coordination Gross Motor Movements are Fluid and Coordinated: No Fine Motor Movements are Fluid and Coordinated: No Finger Nose Finger Test: left with mild dysmetria, right unable Motor  Motor Motor: Hemiplegia;Abnormal tone;Abnormal postural alignment and control Motor - Discharge Observations: continues with RUE flexor tone, decreased postural control/balance strategies but significant improvement overall since evaluation Mobility  Bed Mobility Bed Mobility: Rolling Right;Supine to Sit;Sit to Supine Rolling Right: Independent Rolling Left: Independent Supine to Sit: Supervision/Verbal cueing Sit to Supine: Supervision/Verbal cueing Transfers Sit to Stand: Supervision/Verbal cueing Stand to Sit: Supervision/Verbal cueing  Trunk/Postural Assessment  Postural Control Postural Control: Deficits  on evaluation (without UE support, decreased balance and righting strategie)  Balance Balance Balance Assessed: Yes Standardized Balance Assessment Standardized Balance Assessment: Berg Balance Test Berg Balance Test Sit to Stand: Able to stand  independently using hands Standing Unsupported: Able to stand 2 minutes with supervision Sitting with Back Unsupported but Feet Supported on Floor or Stool: Able to sit safely and securely 2 minutes Stand to Sit: Sits safely with minimal use of hands Transfers: Able to transfer with verbal cueing and /or supervision Standing Unsupported with Eyes Closed: Able  to stand 10 seconds with supervision Standing Ubsupported with Feet Together: Able to place feet together independently and stand for 1 minute with supervision From Standing, Reach Forward with Outstretched Arm: Can reach forward >12 cm safely (5") From Standing Position, Pick up Object from Floor: Able to pick up shoe, needs supervision From Standing Position, Turn to Look Behind Over each Shoulder: Turn sideways only but maintains balance Turn 360 Degrees: Needs close supervision or verbal cueing Standing Unsupported, Alternately Place Feet on Step/Stool: Able to complete >2 steps/needs minimal assist Standing Unsupported, One Foot in Front: Able to take small step independently and hold 30 seconds Standing on One Leg: Tries to lift leg/unable to hold 3 seconds but remains standing independently Total Score: 35 Static Sitting Balance Static Sitting - Level of Assistance: 7: Independent Dynamic Sitting Balance Dynamic Sitting - Level of Assistance: 5: Stand by assistance Static Standing Balance Static Standing - Level of Assistance: 5: Stand by assistance Dynamic Standing Balance Dynamic Standing - Level of Assistance: 5: Stand by assistance Extremity/Trunk Assessment RUE Assessment Passive Range of Motion (PROM) Comments: shoulder to 90 for flex and abd, ER 75, distal WFL, flexor  tightness t/o - able to inhibit with ongoing cues and strategies Active Range of Motion (AROM) Comments: scapula 3/4 with facilitation, shoulder flex/abd 1/4 ge, extension 3/4, ER 1/2 ge, elbow ext full, flex 1/2 ag, pron full with facilitation, wrist ext 1/4, 0 digits RUE Body System: Neuro Brunstrum levels for arm and hand: Arm Brunstrum level for arm: Stage IV Movement is deviating from synergy LUE Assessment Passive Range of Motion (PROM) Comments: WFL post stretch proximally Active Range of Motion (AROM) Comments: shoulder 130 flex/abd, distal WFL - uses left UE for adl tasks   Carlos Levering 02/22/2020, 4:03 PM

## 2020-02-22 NOTE — Progress Notes (Signed)
Physical Therapy Discharge Summary  Patient Details  Name: Erika Stanton MRN: [id number] Date of Birth: 22-Oct-1955  Today's Date: 02/22/2020 PT Individual Time: 1325-1425 PT Individual Time Calculation (min): 60 min  Session focused on finalizing family education with pt and pt's husband, Ronalee Belts in preparation for d/c tomorrow. Instructed and return demonstrated functional transfers (basic, car transfer, various types of seating heights), gait on unit with RW (R hand orthosis) as well as on ramp and over mulched surface to simulate community access as well as home entry (CGA to min assist needed due to management of RW), stair negotiation for 8 steps with L rail and HHA (to descend) to simulate home environment, and education in regards to fall risk, balance deficits, and importance of continued therapy and functional mobility training. Husband able to provide appropriate guarding and cues for patient throughout all activities at supervision to [place] level. Demonstrated donning of R hand WHO for use at night per his questions. Administered berg balance assessment and results discussed with pt and husband in regards to fall risk as well as improvement since last test done. Both state they feel ready for d/c and husband reports feeling less nervous about it overall as well.   Patient has met 9 of 9 long term goals due to improved activity tolerance, improved balance, improved postural control, increased strength, increased range of motion, decreased pain, ability to compensate for deficits, functional use of  right upper extremity, right lower extremity, left upper extremity and left lower extremity, improved attention, improved awareness and improved coordination.  Patient to discharge at an ambulatory level Supervision to min assist with RW.   Patient's care partner is independent to provide the necessary physical and cognitive assistance at discharge.  Reasons goals not met: n/a - all goals met at this  time.  Recommendation:  Patient will benefit from ongoing skilled PT services in outpatient setting to continue to advance safe functional mobility, address ongoing impairments in strength, balance, endurance, tone management, safety, cognition, and minimize fall risk.  Equipment: transport w/c, hand orthosis (R). Pt has RW available at home. Was brought in and adjusted by PT.  Reasons for discharge: treatment goals met and discharge from hospital  Patient/family agrees with progress made and goals achieved: Yes  PT Discharge Precautions/Restrictions Precautions Precautions: Fall Restrictions Weight Bearing Restrictions: No  Pain Pain Assessment Pain Scale: 0-10 Pain Score: 2  Pain Type: Acute pain Pain Location: Hand Pain Orientation: Right Pain Descriptors / Indicators: Aching Pain Frequency: Intermittent Pain Onset: With Activity Patients Stated Pain Goal: 0 Pain Intervention(s): Medication (See eMAR) Multiple Pain Sites: No Vision/Perception  Perception Perception: Impaired Inattention/Neglect:  (decreased to RUE)  Cognition Overall Cognitive Status: Impaired/Different from baseline Arousal/Alertness: Awake/alert Orientation Level: Oriented X4 Attention: Selective Focused Attention: Appears intact Sustained Attention: Appears intact Selective Attention: Impaired Selective Attention Impairment: Verbal basic;Functional basic Memory: Appears intact Awareness: Impaired Awareness Impairment: Emergent impairment Problem Solving: Impaired Problem Solving Impairment: Functional complex Safety/Judgment: Impaired Sensation Coordination Gross Motor Movements are Fluid and Coordinated: No (RUE impaired) Motor  Motor Motor: Hemiplegia;Abnormal tone;Abnormal postural alignment and control Motor - Discharge Observations: continues with RUE flexor tone, decreased postural control/balance strategies but significant improvement overall since evaluation  Mobility Bed  Mobility Bed Mobility: Rolling Right;Supine to Sit;Sit to Supine Rolling Right: Supervision/verbal cueing Supine to Sit: Supervision/Verbal cueing Sit to Supine: Supervision/Verbal cueing Transfers Transfers: Sit to Stand;Stand Pivot Transfers;Stand to Sit Sit to Stand: Supervision/Verbal cueing Stand to Sit: Supervision/Verbal cueing Stand Pivot Transfers: Supervision/Verbal  cueing Stand Pivot Transfer Details: Verbal cues for safe use of DME/AE;Verbal cues for precautions/safety Locomotion  Gait Ambulation: Yes Gait Assistance: Supervision/Verbal cueing Gait Distance (Feet): 200 Feet Assistive device: Rolling walker (R hand orthosis) Gait Assistance Details: Verbal cues for gait pattern;Verbal cues for precautions/safety;Verbal cues for safe use of DME/AE Gait Assistance Details: narrow BOS, decreased control and awareness of safe use of RW at times Gait Gait: Yes Gait Pattern: Impaired Gait Pattern: Scissoring;Narrow base of support Stairs / Additional Locomotion Stairs: Yes Stairs Assistance: Contact Guard/Touching assist Stair Management Technique: One rail Left;Step to pattern;Two rails Number of Stairs: 8 Height of Stairs: 6 Ramp: Supervision/Verbal cueing Wheelchair Mobility Wheelchair Mobility: No  Trunk/Postural Assessment  Postural Control Postural Control: Deficits on evaluation (without UE support, decreased balance and righting strategie)  Balance Balance Balance Assessed: Yes Standardized Balance Assessment Standardized Balance Assessment: Berg Balance Test Berg Balance Test Sit to Stand: Able to stand  independently using hands Standing Unsupported: Able to stand 2 minutes with supervision Sitting with Back Unsupported but Feet Supported on Floor or Stool: Able to sit safely and securely 2 minutes Stand to Sit: Sits safely with minimal use of hands Transfers: Able to transfer with verbal cueing and /or supervision Standing Unsupported with Eyes Closed:  Able to stand 10 seconds with supervision Standing Ubsupported with Feet Together: Able to place feet together independently and stand for 1 minute with supervision From Standing, Reach Forward with Outstretched Arm: Can reach forward >12 cm safely (5") From Standing Position, Pick up Object from Floor: Able to pick up shoe, needs supervision From Standing Position, Turn to Look Behind Over each Shoulder: Turn sideways only but maintains balance Turn 360 Degrees: Needs close supervision or verbal cueing Standing Unsupported, Alternately Place Feet on Step/Stool: Able to complete >2 steps/needs minimal assist Standing Unsupported, One Foot in Front: Able to take small step independently and hold 30 seconds Standing on One Leg: Tries to lift leg/unable to hold 3 seconds but remains standing independently Total Score: 35 Static Sitting Balance Static Sitting - Level of Assistance: 7: Independent Dynamic Sitting Balance Dynamic Sitting - Level of Assistance: 5: Stand by assistance Static Standing Balance Static Standing - Level of Assistance: 5: Stand by assistance Dynamic Standing Balance Dynamic Standing - Level of Assistance: 5: Stand by assistance (with UE support) Extremity Assessment      RLE Assessment General Strength Comments: grossly appears 4/5 with functional movement LLE Assessment General Strength Comments: grossly appears 4-/5 with functional mobility    Canary Brim Ivory Broad, PT, DPT, CBIS  02/22/2020, 2:48 PM

## 2020-02-22 NOTE — Plan of Care (Signed)
  Problem: Consults Goal: RH BRAIN INJURY PATIENT EDUCATION Description: Description: See Patient Education module for eduction specifics Outcome: Progressing Goal: Skin Care Protocol Initiated - if Braden Score 18 or less Description: If consults are not indicated, leave blank or document N/A Outcome: Progressing Goal: Nutrition Consult-if indicated Outcome: Progressing Goal: Diabetes Guidelines if Diabetic/Glucose > 140 Description: If diabetic or lab glucose is > 140 mg/dl - Initiate Diabetes/Hyperglycemia Guidelines & Document Interventions  Outcome: Progressing   Problem: RH BOWEL ELIMINATION Goal: RH STG MANAGE BOWEL WITH ASSISTANCE Description: STG Manage Bowel with min Assistance. Outcome: Progressing Goal: RH STG MANAGE BOWEL W/MEDICATION W/ASSISTANCE Description: STG Manage Bowel with Medication with min Assistance. Outcome: Progressing   Problem: RH BLADDER ELIMINATION Goal: RH STG MANAGE BLADDER WITH ASSISTANCE Description: STG Manage Bladder With min Assistance Outcome: Progressing Goal: RH STG MANAGE BLADDER WITH MEDICATION WITH ASSISTANCE Description: STG Manage Bladder With Medication With min Assistance. Outcome: Progressing   Problem: RH SKIN INTEGRITY Goal: RH STG SKIN FREE OF INFECTION/BREAKDOWN Description: Skin to remain free from infection and breakdown with min assist while on CIR. Outcome: Progressing Goal: RH STG MAINTAIN SKIN INTEGRITY WITH ASSISTANCE Description: STG Maintain Skin Integrity With min Assistance. Outcome: Progressing Goal: RH STG ABLE TO PERFORM INCISION/WOUND CARE W/ASSISTANCE Description: STG Able To Perform Incision/Wound Care With min Assistance. Outcome: Progressing   Problem: RH SAFETY Goal: RH STG ADHERE TO SAFETY PRECAUTIONS W/ASSISTANCE/DEVICE Description: STG Adhere to Safety Precautions With min Assistance and appropriate assistive Device. Outcome: Progressing   Problem: RH COGNITION-NURSING Goal: RH STG USES MEMORY  AIDS/STRATEGIES W/ASSIST TO PROBLEM SOLVE Description: STG Uses Memory Aids/Strategies With min Assistance to Problem Solve. Outcome: Progressing Goal: RH STG ANTICIPATES NEEDS/CALLS FOR ASSIST W/ASSIST/CUES Description: STG Anticipates Needs/Calls for Assist With min Assistance/Cues. Outcome: Progressing   Problem: RH PAIN MANAGEMENT Goal: RH STG PAIN MANAGED AT OR BELOW PT'S PAIN GOAL Description: <3 on a 0-10 pain scale. Outcome: Progressing   Problem: RH KNOWLEDGE DEFICIT BRAIN INJURY Goal: RH STG INCREASE KNOWLEDGE OF SELF CARE AFTER BRAIN INJURY Description: Patient will demonstrate knowledge of medication management, diabetes management, dietary management, and follow up care with the MD post discharge with min assist from [place] staff. Outcome: Progressing Goal: RH STG INCREASE KNOWLEDGE OF DYSPHAGIA/FLUID INTAKE Description: Patient will be able to demonstrate knowledge of dysphagia diets, thickened liquids, and swallowing precautions with min assist from rehab staff. Outcome: Progressing   Problem: RH Vision Goal: RH LTG Vision (Specify) Outcome: Progressing

## 2020-02-22 NOTE — Progress Notes (Signed)
Occupational Therapy Session Note  Patient Details  Name: Erika Stanton MRN: [id number] Date of Birth: Apr 23, 1956  Today's Date: 02/22/2020 OT Individual Time: 8366-2947 OT Individual Time Calculation (min): 77 min    Short Term Goals: Week 3:  OT Short Term Goal 1 (Week 3): Pt will be able to use LUE to wash R arm, torso and LEs with CGA. OT Short Term Goal 1 - Progress (Week 3): Met OT Short Term Goal 2 (Week 3): Pt will use LUE to pull  a shirt on and off over her head. OT Short Term Goal 2 - Progress (Week 3): Met OT Short Term Goal 3 (Week 3): Pt will be able to tolerate self ROM to RUE with min A. OT Short Term Goal 3 - Progress (Week 3): Met Week 4:  OT Short Term Goal 1 (Week 4): STGs = LTGs  Skilled Therapeutic Interventions/Progress Updates:    Patient seated in recliner, husband present for 50% of session.  Patient able to stand from low surface with CS, ambulate with RW (right hand assist) CS to/from bed, recliner, arm chair, toilet, shower bench with cues for safety/hand placement and prep for weight bearing through right UE due to flexor tendencies.  Shower completed seated on shower bench - she requires assistance for hair washing, thoroughness with bilateral UEs and buttocks.  Dressing completed seated in arm chair.  Mod A for bra and OH shirt, she is able to thread underwear and pants over bilateral legs and requires mod A for CM in stance.  Re-taped shoulder and forearm to promote scapular positioning and facilitate wrist extensors.  Reviewed guarding positions for mobility and activities in stance, bilateral UB positioning, posture, HEP and strategies to inhibit right flexor tightness and facilitate extensors.  She returned to recliner at close of session.  Call bell in reach.  Patient and husband state that they are nervous but ready for discharge to home tomorrow.    Therapy Documentation Precautions:  Precautions Precautions: Fall Precaution Comments:  NPO Restrictions Weight Bearing Restrictions: No   Therapy/Group: Individual Therapy  Carlos Levering 02/22/2020, 7:36 AM

## 2020-02-22 NOTE — Progress Notes (Signed)
Patient ID: Erika Stanton, female   DOB: [birth year], 64 y.o.   MRN: [id number] [facility] has not contacted husband to set up appointment. Contacted them and was told recived referral and husband needed to call to make the appointment gave him the number to call and arrange pt's appointments.

## 2020-02-22 NOTE — Progress Notes (Signed)
[place] PHYSICAL MEDICINE & REHABILITATION PROGRESS NOTE   Subjective/Complaints: Discussed labwork Still has RIght shoulder pain with movement but not when sleeping, also has finger/hand pain   ROS:   Pt denies SOB, abd pain, CP, N/V/D  Objective:   No results found. Recent Labs    02/22/20 0616  WBC 4.9  HGB 11.6*  HCT 37.5  PLT 344   Recent Labs    02/22/20 0616  NA 143  K 3.8  CL 107  CO2 25  GLUCOSE 96  BUN 5*  CREATININE 0.53  CALCIUM 9.1    Intake/Output Summary (Last 24 hours) at 02/22/2020 0807 Last data filed at 02/22/2020 0125 Gross per 24 hour  Intake 480 ml  Output --  Net 480 ml     Physical Exam: Vital Signs Blood pressure 112/67, pulse 73, temperature 98.2 F (36.8 C), resp. rate 17, height 5\' 6"  (1.676 m), weight 89.5 kg, SpO2 91 %.   General: No acute distress Mood and affect are appropriate Heart: Regular rate and rhythm no rubs murmurs or extra sounds Lungs: Clear to auscultation, breathing unlabored, no rales or wheezes Abdomen: Positive bowel sounds, soft nontender to palpation, nondistended Extremities: No clubbing, cyanosis, or edema Skin: No evidence of breakdown, no evidence of rash   awareness. Left CN VII.    2+ bicep, triceps/ 1+ WE, trace finger flex/ext RUE , 4/5 RLE,  LUE 4-/5 , LLE 4/5. Right arm is less sensitive to touch with decreased LT. Musculoskeletal: Right arm remains less tender to PROM- R shoulder taped with kineseotaping.  Skin: bruises slowly resolving     Assessment/Plan: 1. 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  Physiatrist is providing close team supervision and 24 hour management of active medical problems listed below.  Physiatrist and rehab team continue to assess barriers to discharge/monitor patient progress toward functional and medical goals  Care  Tool:  Bathing    Body parts bathed by patient: Front perineal area, Abdomen, Left upper leg, Right upper leg, Chest, Right lower leg, Face, Left lower leg   Body parts bathed by helper: Right arm, Left arm, Buttocks, Left lower leg     Bathing assist Assist Level: Moderate Assistance - Patient 50 - 74%     Upper Body Dressing/Undressing Upper body dressing   What is the patient wearing?: Pull over shirt, Bra    Upper body assist Assist Level: Moderate Assistance - Patient 50 - 74%    Lower Body Dressing/Undressing Lower body dressing      What is the patient wearing?: Underwear/pull up, Pants     Lower body assist Assist for lower body dressing: Minimal Assistance - Patient > 75%     Toileting Toileting    Toileting assist Assist for toileting: Moderate Assistance - Patient 50 - 74%     Transfers Chair/bed transfer  Transfers assist     Chair/bed transfer assist level: Contact Guard/Touching assist     Locomotion Ambulation   Ambulation assist      Assist level: Supervision/Verbal cueing Assistive device: Walker-rolling Max distance: 150   Walk 10 feet activity   Assist     Assist level: Supervision/Verbal cueing Assistive device: Walker-rolling   Walk 50 feet activity   Assist Walk 50 feet with 2 turns activity did not occur: Safety/medical concerns  Assist level: Supervision/Verbal cueing Assistive device: Walker-rolling    Walk 150 feet activity  Assist Walk 150 feet activity did not occur: Safety/medical concerns  Assist level: Supervision/Verbal cueing Assistive device: Walker-rolling    Walk 10 feet on uneven surface  activity   Assist Walk 10 feet on uneven surfaces activity did not occur: Safety/medical concerns   Assist level: Minimal Assistance - Patient > 75% Assistive device: Aeronautical engineer Will patient use wheelchair at discharge?: Yes Type of Wheelchair: Manual            Wheelchair 50 feet with 2 turns activity    Assist    Wheelchair 50 feet with 2 turns activity did not occur: Safety/medical concerns (requires TIS for postural support)       Wheelchair 150 feet activity     Assist  Wheelchair 150 feet activity did not occur: Safety/medical concerns   Assist Level: Supervision/Verbal cueing   Blood pressure 112/67, pulse 73, temperature 98.2 F (36.8 C), resp. rate 17, height 5\' 6"  (1.676 m), weight 89.5 kg, SpO2 91 %.  Medical Problem List and Plan: 1.  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  Continue CIR, PT, OT, SLP     -making progressive in all areas  -plan d/c in am  2.  Antithrombotics: -DVT/anticoagulation:  Pharmaceutical: Heparin--hold                          Dopplers negative for DVT.              -antiplatelet therapy: DAPT--resumed plavix, asa without problems   3. Pain Management: Tylenol prn.    -neuropathic pain RUE   -10/1 right arm much improved. Continue same gabapentin 100mg  TID  Right arm subluxation- continue support with pillow/lap tray. She continues to see return in the RUE which is encouraging.  101/10 taping of R shoulder and tylenol prn- con't regimen  4. Mood: LCSW to follow for evaluation and support when appropriate.              -antipsychotic agents: N/A 5. Neuropsych: This patient is not capable of making decisions on her own behalf.  -arousal and initiation much better. Ritalin stopped. 6. Skin/Wound Care: routine pressure relief measures.   7. Fluids/Electrolytes/Nutrition:   -on D3/thins, eating 70-90%!!!  -recheck CMET 10/11, pt would like to stop potassium if possible  - reduced potassium to 80meq daily starting 10/8, 10/11 labs normal K+  8. ABLA/UGIB:     EGD 9/15 apparently demonstrated non-bleeding esophagitis/gastritis, HH   -husband  doesn't want to pursue further testing  9/17: resumed plavix/asa. Will not resume sq heparin   10/1 continue protonix  10/11- Hgb stable 11.6 9. 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 Resting tachycardia:              well controlled 12. Vasospasm prevention: nimotop completed 13. Bowel incontinence/loose stool:   -fiber/probiotic  -stools now formed  14. Proteus UTI/enteroccus UTI/urine retention: Ceftriaxone course completed for proteus  -amoxil x 5 days for enterococcus  completed 9/24  -10/8 dc flomax 18.  Prediabetes,    Relatively controlled. dc'ed  CBG checks          LOS: 33 days A FACE TO FACE EVALUATION WAS PERFORMED  Charlett Blake 02/22/2020, 8:07 AM

## 2020-02-22 NOTE — Discharge Instructions (Signed)
Inpatient Rehab Discharge Instructions  LLOYD CULLINAN Discharge date and time: No discharge date for patient encounter.   Activities/Precautions/ Functional Status: Activity: activity as tolerated Diet: Soft Wound Care: Routine skin checks Functional status:  ___ No restrictions     ___ Walk up steps independently ___ 24/7 supervision/assistance   ___ Walk up steps with assistance ___ Intermittent supervision/assistance  ___ Bathe/dress independently ___ Walk with walker     _x__ Bathe/dress with assistance ___ Walk Independently    ___ Shower independently ___ Walk with assistance    ___ Shower with assistance ___ No alcohol     ___ Return to work/school ________  Special Instructions:  No driving smoking or alcohol    COMMUNITY REFERRALS UPON DISCHARGE:   Outpatient: PT, OT, SP             Agency:[facility] Phone:[phone]              Appointment Date/Time:HAVE SPOKEN WITH HUSBAND AND MADE APPOINTMENTS  Medical Equipment/Items Ordered: TRANSPORT CHAIR                                                 Agency/Supplier: [facility]  [phone]  My questions have been answered and I understand these instructions. I will adhere to these goals and the provided educational materials after my discharge from the hospital.  Patient/Caregiver Signature _______________________________ Date __________  Clinician Signature _______________________________________ Date __________  Please bring this form and your medication list with you to all your follow-up doctor's appointments.

## 2020-02-22 NOTE — Progress Notes (Signed)
Speech Language Pathology Discharge Summary  Patient Details  Name: Erika Stanton MRN: [id number] Date of Birth: 1956-05-09  Today's Date: 02/22/2020 SLP Individual Time: 0800-0855 SLP Individual Time Calculation (min): 55 min   Skilled Therapeutic Interventions:  Skilled treatment session focused on cognitive goals. Upon arrival, patient's husband was on the phone and asking to speak to the SLP because he had questions. Patient's husband asking questions about appropriate textures for patient's current diet. SLP re-educated him on appropriate foods and afirrmed that we spoke about this topic multiple times last week. A handout was also given to reinforce information. He verbalized understanding. SLP also facilitated session by providing overall supervision level verbal cues for functional problem solving during a complex medication management task. Patient transferred back to recliner at end of session and left with alarm on and all needs within reach.   Patient has met 8 of 8 long term goals.  Patient to discharge at overall Supervision level.   Reasons goals not met: N/A   Clinical Impression/Discharge Summary: Patient has made excellent gains and has met 8 of 8 LTGs this admission. Currently, patient is consuming Dys. 3 textures with thin liquids with minimal overt s/s of aspiration and overall supervision level verbal cues for use of swallowing compensatory strategies. Patient also requires overall supervision-Min A verbal cues to complete functional and familiar tasks safely in regards to selective attention, functional problem solving and emergent awareness. Patient demonstrates improved speech intelligibility and is ~100% intelligible at the conversation level. Patient and family education is complete. Patient would benefit from f/u SLP services to maximize her cognitive and swallowing function and overall functional independence in order to reduce caregiver burden.   Care Partner:   Caregiver Able to Provide Assistance: Yes  Type of Caregiver Assistance: Physical;Cognitive  Recommendation:  24 hour supervision/assistance;Outpatient SLP  Rationale for SLP Follow Up: Reduce caregiver burden;Maximize cognitive function and independence;Maximize swallowing safety   Equipment: N/A   Reasons for discharge: Discharged from hospital;Treatment goals met   Patient/Family Agrees with Progress Made and Goals Achieved: Yes    Zoeya Gramajo, [place] 02/22/2020, 6:19 AM

## 2020-02-23 NOTE — Progress Notes (Signed)
[facility] PHYSICAL MEDICINE & REHABILITATION PROGRESS NOTE   Subjective/Complaints: DC home today Still has some arm pain- Gabapentin and Tylenol help No other complaints  ROS:   Pt denies SOB, abd pain, CP, N/V/D  Objective:   No results found. Recent Labs    02/22/20 0616  WBC 4.9  HGB 11.6*  HCT 37.5  PLT 344   Recent Labs    02/22/20 0616  NA 143  K 3.8  CL 107  CO2 25  GLUCOSE 96  BUN 5*  CREATININE 0.53  CALCIUM 9.1    Intake/Output Summary (Last 24 hours) at 02/23/2020 0925 Last data filed at 02/23/2020 0354 Gross per 24 hour  Intake 932 ml  Output --  Net 932 ml     Physical Exam: Vital Signs Blood pressure 116/64, pulse 78, temperature 97.9 F (36.6 C), resp. rate 14, height 5\' 6"  (1.676 m), weight 89.5 kg, SpO2 97 %.  General: Alert and oriented x 3, No apparent distress HEENT: Head is normocephalic, atraumatic, PERRLA, EOMI, sclera anicteric, oral mucosa pink and moist, dentition intact, ext ear canals clear,  Neck: Supple without JVD or lymphadenopathy Heart: Reg rate and rhythm. No murmurs rubs or gallops Chest: CTA bilaterally without wheezes, rales, or rhonchi; no distress Abdomen: Soft, non-tender, non-distended, bowel sounds positive.  awareness. Left CN VII.    2+ bicep, triceps/ 1+ WE, trace finger flex/ext RUE , 4/5 RLE,  LUE 4-/5 , LLE 4/5. Right arm is less sensitive to touch with decreased LT. Musculoskeletal: Right arm remains less tender to PROM- R shoulder taped with kineseotaping.  Skin: bruises slowly resolving  Assessment/Plan: 1. 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  Physiatrist is providing close team supervision and 24 hour management of active medical problems listed below.  Physiatrist and rehab team continue to assess barriers to discharge/monitor patient progress toward  functional and medical goals  Care Tool:  Bathing    Body parts bathed by patient: Front perineal area, Abdomen, Left upper leg, Right upper leg, Chest, Right lower leg, Face, Left lower leg, Left arm   Body parts bathed by helper: Right arm, Left arm, Buttocks     Bathing assist Assist Level: Minimal Assistance - Patient > 75%     Upper Body Dressing/Undressing Upper body dressing   What is the patient wearing?: Bra, Pull over shirt    Upper body assist Assist Level: Moderate Assistance - Patient 50 - 74%    Lower Body Dressing/Undressing Lower body dressing      What is the patient wearing?: Underwear/pull up, Pants     Lower body assist Assist for lower body dressing: Minimal Assistance - Patient > 75%     Toileting Toileting    Toileting assist Assist for toileting: Moderate Assistance - Patient 50 - 74%     Transfers Chair/bed transfer  Transfers assist     Chair/bed transfer assist level: Supervision/Verbal cueing     Locomotion Ambulation   Ambulation assist      Assist level: Supervision/Verbal cueing Assistive device: Walker-rolling Max distance: 200'   Walk 10 feet activity   Assist     Assist level: Supervision/Verbal cueing Assistive device: Walker-rolling, Orthosis   Walk 50 feet activity   Assist Walk 50 feet with 2 turns activity did not occur: Safety/medical concerns  Assist level: Supervision/Verbal cueing Assistive device: Walker-rolling, Orthosis    Walk 150 feet activity  Assist Walk 150 feet activity did not occur: Safety/medical concerns  Assist level: Supervision/Verbal cueing Assistive device: Walker-rolling, Orthosis    Walk 10 feet on uneven surface  activity   Assist Walk 10 feet on uneven surfaces activity did not occur: Safety/medical concerns   Assist level: Minimal Assistance - Patient > 75% Assistive device: Walker-rolling, Orthosis   Wheelchair     Assist Will patient use wheelchair at  discharge?: No Type of Wheelchair: Manual           Wheelchair 50 feet with 2 turns activity    Assist    Wheelchair 50 feet with 2 turns activity did not occur: Safety/medical concerns (requires TIS for postural support)       Wheelchair 150 feet activity     Assist  Wheelchair 150 feet activity did not occur: Safety/medical concerns   Assist Level: Supervision/Verbal cueing   Blood pressure 116/64, pulse 78, temperature 97.9 F (36.6 C), resp. rate 14, height 5\' 6"  (1.676 m), weight 89.5 kg, SpO2 97 %.  Medical Problem List and Plan: 1.  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  Continue CIR, PT, OT, SLP     -making progressive in all areas  -DC today  2.  Antithrombotics: -DVT/anticoagulation:  Pharmaceutical: Heparin--hold                          Dopplers negative for DVT.              -antiplatelet therapy: DAPT--resumed plavix, asa without problems   3. Pain Management: Tylenol prn.    -neuropathic pain RUE   -10/1 right arm much improved. Continue same gabapentin 100mg  TID  Right arm subluxation- continue support with pillow/lap tray. She continues to see return in the RUE which is encouraging.  -well controlled 4. Mood: LCSW to follow for evaluation and support when appropriate.              -antipsychotic agents: N/A 5. Neuropsych: This patient is not capable of making decisions on her own behalf.  -arousal and initiation much better. Ritalin stopped. 6. Skin/Wound Care: routine pressure relief measures.   7. Fluids/Electrolytes/Nutrition:   -on D3/thins, eating 70-90%!!!  -recheck CMET 10/11, pt would like to stop potassium if possible  - reduced potassium to 47meq daily starting 10/8, 10/11 labs normal K+   -discussed high potassium foods 8. ABLA/UGIB:     EGD 9/15 apparently demonstrated non-bleeding  esophagitis/gastritis, HH   -husband doesn't want to pursue further testing  9/17: resumed plavix/asa. Will not resume sq heparin   10/1 continue protonix  10/11- Hgb stable 11.6 9. 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 Resting tachycardia:              well controlled 12. Vasospasm prevention: nimotop completed 13. Bowel incontinence/loose stool:   -fiber/probiotic  -stools not formed 14. Proteus UTI/enteroccus UTI/urine retention: Ceftriaxone course completed for proteus  -amoxil x 5 days for enterococcus  completed 9/24  -10/8 dc flomax 18.  Prediabetes,    Relatively controlled. dc'ed  CBG checks   >30 minutes spent in discharge of patient including review of medications and follow-up appointments, physical examination, discussed high potassium foods, allergy medications, and in answering all patient's questions          LOS: 34 days A FACE TO FACE EVALUATION WAS PERFORMED  Erika Stanton 02/23/2020, 9:25 AM

## 2020-02-23 NOTE — Plan of Care (Signed)
Pt discharged home with personal belongings. Discharge instructions provided by PA, pt and husband verbalized an understanding of instructions, follow up apps and medications.

## 2020-02-24 NOTE — Progress Notes (Signed)
Inpatient Rehabilitation Care Coordinator  Discharge Note  The overall goal for the admission was met for:   Discharge location: Yes. D/c to home with husband who will provide 24/7 care.   Length of Stay: Yes. 33 days.   Discharge activity level: Yes. Min A.  Home/community participation: Yes. Limited.  Services provided included: MD, RD, PT, OT, SLP, RN, CM, TR, Pharmacy, Neuropsych and SW  Financial Services: Private Insurance: [employer]  Follow-up services arranged: Outpatient: [facility] for PT/OT/SLP and DME: [employer] for transport chair  Comments (or additional information): contact pt husband Ronalee Belts [phone]  Patient/Family verbalized understanding of follow-up arrangements: Yes  Individual responsible for coordination of the follow-up plan: Pt to  have assistance coordinating care needs.   Confirmed correct DME delivered: Rana Snare 02/24/2020    Rana Snare

## 2020-02-25 ENCOUNTER — Ambulatory Visit: Payer: BC Managed Care – PPO | Attending: Physical Medicine & Rehabilitation | Admitting: Rehabilitation

## 2020-02-25 ENCOUNTER — Encounter: Payer: Self-pay | Admitting: Rehabilitation

## 2020-02-25 ENCOUNTER — Telehealth: Payer: Self-pay | Admitting: Registered Nurse

## 2020-02-25 ENCOUNTER — Other Ambulatory Visit: Payer: Self-pay

## 2020-02-25 DIAGNOSIS — M25511 Pain in right shoulder: Secondary | ICD-10-CM | POA: Insufficient documentation

## 2020-02-25 DIAGNOSIS — M6281 Muscle weakness (generalized): Secondary | ICD-10-CM | POA: Diagnosis present

## 2020-02-25 DIAGNOSIS — R2681 Unsteadiness on feet: Secondary | ICD-10-CM | POA: Insufficient documentation

## 2020-02-25 DIAGNOSIS — R41840 Attention and concentration deficit: Secondary | ICD-10-CM | POA: Insufficient documentation

## 2020-02-25 DIAGNOSIS — M79641 Pain in right hand: Secondary | ICD-10-CM | POA: Diagnosis present

## 2020-02-25 DIAGNOSIS — R278 Other lack of coordination: Secondary | ICD-10-CM | POA: Diagnosis present

## 2020-02-25 DIAGNOSIS — I69159 Hemiplegia and hemiparesis following nontraumatic intracerebral hemorrhage affecting unspecified side: Secondary | ICD-10-CM | POA: Insufficient documentation

## 2020-02-25 DIAGNOSIS — R1312 Dysphagia, oropharyngeal phase: Secondary | ICD-10-CM | POA: Diagnosis present

## 2020-02-25 DIAGNOSIS — R41844 Frontal lobe and executive function deficit: Secondary | ICD-10-CM | POA: Diagnosis present

## 2020-02-25 DIAGNOSIS — R41842 Visuospatial deficit: Secondary | ICD-10-CM | POA: Diagnosis present

## 2020-02-25 DIAGNOSIS — R41841 Cognitive communication deficit: Secondary | ICD-10-CM | POA: Insufficient documentation

## 2020-02-25 DIAGNOSIS — R2689 Other abnormalities of gait and mobility: Secondary | ICD-10-CM | POA: Diagnosis present

## 2020-02-25 NOTE — Telephone Encounter (Signed)
Transitional Care call Mr. Galgano answered the TC Questions  Patient name: Erika Stanton. Erika Stanton DOB: 30-Nov-1955 1. Are you/is patient experiencing any problems since coming home? No a. Are there any questions regarding any aspect of care? No 2. Are there any questions regarding medications administration/dosing? No a. Are meds being taken as prescribed? Yes, Mr. Ki stated Mrs. Blackstock was taking potassium in the hospital and he gave her half of his potassium, because it wasn't on her medication list He was instructed not to give Mrs. Perot any of his medication. Dr Ranell Patrick note was reviewed, she was instructed to eat foods high in potassium. Mr. Prisco was called again and the above was discussed with him. He was also instructed to call Ms. Eulas Post PCP to schedule HFU appointment,  And will obtain lab work,he verbalizes understanding.  b. "Patient should review meds with caller to confirm" Medication list was reviewed.  3. Have there been any falls? No 4. Has Home Health been to the house and/or have they contacted you? She had her first appointment at [facility] today he reports.  a. If not, have you tried to contact them? NA b. Can we help you contact them? NA 5. Are bowels and bladder emptying properly? Yes a. Are there any unexpected incontinence issues? No b. If applicable, is patient following bowel/bladder programs? NA 6. Any fevers, problems with breathing, unexpected pain? No 7. Are there any skin problems or new areas of breakdown? No 8. Has the patient/family member arranged specialty MD follow up (ie cardiology/neurology/renal/surgical/etc.)?  Mr. Chamblee was instructed to call Dr. Lujean Amel and Dr Modena Nunnery to schedule HFU appointments. He verbalizes understanding.  a. Can we help arrange? No 9. Does the patient need any other services or support that we can help arrange? No 10. Are caregivers following through as expected in assisting the patient? Yes 11. Has the  patient quit smoking, drinking alcohol, or using drugs as recommended? (                        )  Appointment date/time 03/04/2020  arrival time8:45 for 9:00 appointment with Dr Ranell Patrick. At [place]

## 2020-02-25 NOTE — Therapy (Signed)
[facility] [address] [place], [place], [address] Phone: [phone]   Fax:  [phone]  Physical Therapy Evaluation  Patient Details  Name: Erika Stanton MRN: [id number] Date of Birth: [birth year] Referring Provider (PT): Alger Simons, MD   Encounter Date: 02/25/2020   PT End of Session - 02/25/20 1238    Visit Number 1    Number of Visits 21    Date for PT Re-Evaluation 04/25/20    Authorization Type BCBS State    PT Start Time 0848    PT Stop Time 0934    PT Time Calculation (min) 46 min    Equipment Utilized During Treatment Gait belt    Activity Tolerance Patient tolerated treatment well    Behavior During Therapy [facility] for tasks assessed/performed           History reviewed. No pertinent past medical history.  Past Surgical History:  Procedure Laterality Date  . CHOLECYSTECTOMY    . COLONOSCOPY  11/2017   at [facility]. no recurrent polyps.  suggest repeat surveillance study 11/2022.    Marland Kitchen COLONOSCOPY W/ POLYPECTOMY  06/2014   Dr Roxy Manns at [facility].  3 adenomatoous polyps, anal fissure.    . ESOPHAGOGASTRODUODENOSCOPY (EGD) WITH PROPOFOL N/A 01/27/2020   Procedure: ESOPHAGOGASTRODUODENOSCOPY (EGD) WITH PROPOFOL;  Surgeon: Mauri Pole, MD;  Location: [facility];  Service: Endoscopy;  Laterality: N/A;  . IR 3D INDEPENDENT WKST  01/06/2020  . IR ANGIO INTRA EXTRACRAN SEL INTERNAL CAROTID BILAT MOD SED  01/06/2020  . IR ANGIO VERTEBRAL SEL VERTEBRAL UNI L MOD SED  01/06/2020  . IR ANGIOGRAM FOLLOW UP STUDY  01/06/2020  . IR ANGIOGRAM FOLLOW UP STUDY  01/06/2020  . IR ANGIOGRAM FOLLOW UP STUDY  01/06/2020  . IR ANGIOGRAM FOLLOW UP STUDY  01/06/2020  . IR ANGIOGRAM FOLLOW UP STUDY  01/06/2020  . IR ANGIOGRAM FOLLOW UP STUDY  01/06/2020  . IR ANGIOGRAM FOLLOW UP STUDY  01/06/2020  . IR ANGIOGRAM FOLLOW UP STUDY  01/06/2020  . IR ANGIOGRAM FOLLOW UP STUDY  01/06/2020  . IR ANGIOGRAM FOLLOW UP STUDY  01/06/2020  .  IR NEURO EACH ADD'L AFTER BASIC UNI RIGHT (MS)  01/06/2020  . IR TRANSCATH/EMBOLIZ  01/06/2020  . RADIOLOGY WITH ANESTHESIA N/A 01/06/2020   Procedure: IR WITH ANESTHESIA FOR ANEURYSM;  Surgeon: Consuella Lose, MD;  Location: [facility];  Service: Radiology;  Laterality: N/A;  . TUBAL LIGATION      There were no vitals filed for this visit.    Subjective Assessment - 02/25/20 0853    Subjective "I know my balance is off but my biggest concern in my right hand.  Getting out of a low chair is difficult, getting off the toilet is somewhat difficult."    Patient is accompained by: Family member   husband Ronalee Belts   Limitations Walking;Standing;House hold activities    How long can you walk comfortably? Did a little walking yesterday up/down the hallway    Patient Stated Goals Use my right hand like I used to.    Currently in Pain? Yes    Pain Score 4     Pain Location Arm    Pain Orientation Right    Pain Descriptors / Indicators Aching    Pain Type Acute pain    Pain Radiating Towards arm but mostly hand    Pain Onset 1 to 4 weeks ago    Pain Frequency Constant    Aggravating Factors  trying to move  it, husband trying to help with arm    Pain Relieving Factors Tylenol              [facility] PT Assessment - 02/25/20 0901      Assessment   Medical Diagnosis B ICHs    Referring Provider (PT) Alger Simons, MD    Onset Date/Surgical Date 01/05/20    Prior Therapy acute, IP rehab       Precautions   Precautions Fall      Balance Screen   Has the patient fallen in the past 6 months No    Has the patient had a decrease in activity level because of a fear of falling?  Yes    Is the patient reluctant to leave their home because of a fear of falling?  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[employer], going out to eat      Cognition   Overall Cognitive Status Impaired/Different from baseline      Observation/Other Assessments   Observations Pt reports some visual deficits on the L side       Sensation   Light Touch Impaired Detail    Light Touch Impaired Details Impaired RUE    Hot/Cold Appears Intact    Proprioception Appears Intact      Coordination   Gross Motor Movements are Fluid and Coordinated Yes   in LEs   Heel Shin Test [facility], some weakness noted in LLE       Posture/Postural Control   Posture/Postural Control Postural limitations    Postural Limitations Rounded Shoulders;Forward head;Posterior pelvic tilt;Weight shift left      ROM / Strength   AROM / PROM / Strength Strength      Strength   Overall Strength Deficits    Overall Strength Comments R LE: hip flex 4/5, knee ext 4/5, knee flex 3+/5, ankle DF 4/5.  LLE:  hip flex 3+/5, knee ext 3+/5, knee flex 3+/5, ankle DF 3/5       Transfers   Transfers Sit to Stand;Stand to Sit    Sit to Stand 4: Min guard    Five time sit to stand comments  32.12 secs with LUE support     Stand to Sit 4: Min guard      Ambulation/Gait   Ambulation/Gait Yes    Ambulation/Gait Assistance 4: Min guard    Ambulation Distance (Feet) 100 Feet    Assistive device Rolling walker   R hand orthosis   Gait Pattern Step-through pattern;Decreased arm swing - right;Decreased arm swing - left;Decreased stride length;Decreased dorsiflexion - left;Decreased weight shift to right;Lateral hip instability;Trunk flexed;Narrow base of support    Ambulation Surface Level;Indoor    Gait velocity 1.80 ft/sec with RW    Stairs Yes     Stairs Assistance 4: Min assist    Stairs Assistance Details (indicate cue type and reason) Pt  used LUE on stairs while PT provided R HHA.  Pt with some pain in hand when PT tried to bring into more supination, therefore supported more at forearm.  She did still need cues for sequencing and technique.      Stair Management Technique One rail Left;Step to pattern;Forwards   R HHA   Number of Stairs 4    Height of Stairs 6      Standardized Balance Assessment   Standardized Balance Assessment Berg Balance Test      Berg Balance Test   Sit to Stand Able to stand using hands after several tries    Standing Unsupported Able to stand 2 minutes with supervision    Sitting with Back Unsupported but Feet Supported on Floor or Stool Able to sit 2 minutes under supervision    Stand to Sit Controls descent by using hands    Transfers Able to transfer with verbal cueing and /or supervision    Standing Unsupported with Eyes Closed Able to stand 10 seconds with supervision    Standing Unsupported with Feet Together Able to place feet together independently and stand for 1 minute with supervision    From Standing, Reach Forward with Outstretched Arm Can reach forward >12 cm safely (5")    From Standing Position, Turn to Look Behind Over each Shoulder Looks behind one side only/other side shows less weight shift    Turn 360 Degrees Needs close supervision or verbal cueing                      Objective measurements completed on examination: See above findings.               PT Education - 02/25/20 1237    Education Details Education on evaluation results, POC, goals, getting OT/SLP evals set today.  Recommendations and rationale for use of RW at all times for balance.    Person(s) Educated Patient;Spouse    Methods Explanation;Demonstration    Comprehension Verbalized understanding;Verbal cues required;Need further instruction            PT Short Term Goals - 02/25/20 1252        PT SHORT TERM GOAL #1   Title Pt/spouse will be independent with initial HEP in order to indicate decreased fall risk and improved functional mobility.  (Target Date: 03/26/20)    Time 4    Period Weeks    Status New    Target Date 03/26/20      PT SHORT TERM GOAL #2   Title Pt will complete BERG balance test and improve score by 6 points from baseline in order to indicate decreased fall risk.    Time 4    Period Weeks    Status New      PT SHORT TERM GOAL #3   Title Pt will improve 5TSS to </=25 secs with single UE support in order to indicate dec fall risk and improved functional strength.    Time 4    Period Weeks    Status New      PT SHORT TERM GOAL #4   Title Pt will improve gait speed to >/=2.4 ft/sec with LRAD in order to indicate dec fall risk.    Time 4    Period Weeks    Status New      PT SHORT TERM GOAL #5   Title Pt will ambulate x 1' at S level with LRAD in simulated household environment in order to indicate  improved independence in her home.    Time 4    Period Weeks    Status New             PT Long Term Goals - 02/25/20 1256      PT LONG TERM GOAL #1   Title Pt/spouse will be independent with final HEP in order to indicate dec fall risk and improved functional mobility.  (Target Date: 04/25/20)    Time 8    Period Weeks    Status New    Target Date 04/25/20      PT LONG TERM GOAL #2   Title Pt will improve BERG balance test score by 10 points from baseline in order to indicate decreased fall risk.    Time 8    Period Weeks    Status New      PT LONG TERM GOAL #3   Title Pt will perform 5TSS in </=20 secs without UE support in order to indicate improved functional strength    Time 8    Period Weeks    Status New      PT LONG TERM GOAL #4   Title Pt will ambulate x 500' w/ LRAD at S level over unlevel paved and grassy surfaces, including ramp/curb in order to indicate safe community mobility.    Time 8    Period Weeks    Status New       PT LONG TERM GOAL #5   Title Pt will negotiate up/down 4 stairs with single L rail at mod I level in order to indicate safe home entry/exit.    Time 8    Period Weeks    Status New                  Plan - 02/25/20 1244    Clinical Impression Statement Pt is pleasant 64 y/o woman with recent B ICHs on 01/05/20 with residual RUE weakness, LLE weakness, minor L neglect, balance deficits and overall generalized weakness.  Pt with no significant previous medical history to note.  Upon PT evaluation, note gait speed to be 1.80 ft/sec with RW and min/guard A placing pt at high fall risk, 5TSS time of 32.12 secs with heavy LUE support indicative of high fall risk and decreased functional strength.  We did not get to finish her BERG balance test, however 2 days ago on rehab her score was 35/56 indicative of high fall risk.  Pt will benefit from skilled OP neuro to address deficits.    Personal Factors and Comorbidities Age;Comorbidity 1;Profession    Comorbidities B ICHs    Examination-Activity Limitations Bend;Carry;Lift;Locomotion Level;Reach Overhead;Squat;Stairs;Stand;Transfers    Examination-Participation Restrictions Cleaning;Community Activity;Driving;Occupation;Yard Work;Laundry;Meal Prep    Stability/Clinical Decision Making Evolving/Moderate complexity    Clinical Decision Making Moderate    Rehab Potential Good    PT Frequency 3x / week   then 2x/wk for 4 weeks   PT Duration 4 weeks   then 2x/wk for 4 weeks   PT Treatment/Interventions ADLs/Self Care Home Management;Aquatic Therapy;Electrical Stimulation;DME Instruction;Gait training;Stair training;Functional mobility training;Therapeutic activities;Therapeutic exercise;Balance training;Neuromuscular re-education;Cognitive remediation;Patient/family education;Orthotic Fit/Training;Manual techniques;Passive range of motion;Dry needling;Energy conservation;Vestibular;Visual/perceptual remediation/compensation    PT Next Visit Plan  Finish BERG balance test, update goal if needed, provide HEP for BLE strengthening (sit<>stand, hamstrings, hips) and standing balance.  Focus on trunk and shoulder activation with gait and mobility tasks    PT Home Exercise Plan They want things to do at home.  I would love to try  and do things to incorporate her RUE since it is so tight.    Consulted and Agree with Plan of Care Patient;Family member/caregiver           Patient will benefit from skilled therapeutic intervention in order to improve the following deficits and impairments:  Abnormal gait, Decreased activity tolerance, Decreased balance, Decreased cognition, Decreased endurance, Decreased knowledge of use of DME, Decreased mobility, Decreased strength, Impaired perceived functional ability, Impaired flexibility, Impaired sensation, Postural dysfunction, Improper body mechanics, Impaired vision/preception, Impaired UE functional use, Impaired tone  Visit Diagnosis: Unsteadiness on feet  Muscle weakness (generalized)  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 Melena   . Elevated BUN   . Prediabetes   . Incontinence of feces   . 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 Cerebral vasospasm   . Sinus tachycardia   . Dysphagia, post-stroke   . Thrombocytopenia ([place])   . Acute blood loss anemia   . Brain aneurysm   . ICH (intracerebral hemorrhage) ([place]) 01/05/2020    Cameron Sprang, PT, MPT [facility] [address] [place], [place], [address] Phone: [phone]   Fax:  [phone] 02/25/20, 1:02 PM  Name: Erika Stanton MRN: [id number] Date of Birth: February 20, 1956

## 2020-02-25 NOTE — Telephone Encounter (Signed)
Mr. Keathley was driving, will return the call in 15 minutes.

## 2020-02-25 NOTE — Telephone Encounter (Signed)
Mr. Leach reported he gave his wife 1/2 tablet of his potassium gluconate tablet, he reports it was a 99 mg tablet. Dr Ranell Patrick note was reviewed. The above was discussed with Dr Ranell Patrick and Mr. Petruska was instructed not to give his wife any of his medications again, he verbalizes understanding. Pharmacist was called Manuela Schwartz reports she had received abut 1.25 milliequivalent of elemental potassium.

## 2020-02-28 ENCOUNTER — Encounter (HOSPITAL_COMMUNITY): Payer: Self-pay | Admitting: Physical Medicine & Rehabilitation

## 2020-02-28 DIAGNOSIS — K922 Gastrointestinal hemorrhage, unspecified: Secondary | ICD-10-CM

## 2020-03-01 ENCOUNTER — Other Ambulatory Visit: Payer: Self-pay

## 2020-03-01 ENCOUNTER — Ambulatory Visit: Payer: BC Managed Care – PPO

## 2020-03-01 DIAGNOSIS — R2689 Other abnormalities of gait and mobility: Secondary | ICD-10-CM

## 2020-03-01 DIAGNOSIS — R2681 Unsteadiness on feet: Secondary | ICD-10-CM | POA: Diagnosis not present

## 2020-03-01 DIAGNOSIS — M6281 Muscle weakness (generalized): Secondary | ICD-10-CM

## 2020-03-01 DIAGNOSIS — I69159 Hemiplegia and hemiparesis following nontraumatic intracerebral hemorrhage affecting unspecified side: Secondary | ICD-10-CM

## 2020-03-01 NOTE — Therapy (Signed)
[facility] [address] [place], [place], [address] Phone: [phone]   Fax:  [phone]  Physical Therapy Treatment  Patient Details  Name: Erika Stanton MRN: [id number] Date of Birth: 29-May-1955 Referring Provider (PT): Alger Simons, MD   Encounter Date: 03/01/2020   PT End of Session - 03/01/20 1108    Visit Number 2    Number of Visits 21    Date for PT Re-Evaluation 04/25/20    Authorization Type [employer] State    PT Start Time 1105    PT Stop Time 1149    PT Time Calculation (min) 44 min    Equipment Utilized During Treatment Gait belt    Activity Tolerance Patient tolerated treatment well    Behavior During Therapy WFL for tasks assessed/performed           Past Medical History:  Diagnosis Date  . Melena     Past Surgical History:  Procedure Laterality Date  . CHOLECYSTECTOMY    . COLONOSCOPY  11/2017   at [facility]. no recurrent polyps.  suggest repeat surveillance study 11/2022.    Marland Kitchen COLONOSCOPY W/ POLYPECTOMY  06/2014   Dr Roxy Manns at [facility].  3 adenomatoous polyps, anal fissure.    . ESOPHAGOGASTRODUODENOSCOPY (EGD) WITH PROPOFOL N/A 01/27/2020   Procedure: ESOPHAGOGASTRODUODENOSCOPY (EGD) WITH PROPOFOL;  Surgeon: Mauri Pole, MD;  Location: [facility];  Service: Endoscopy;  Laterality: N/A;  . IR 3D INDEPENDENT WKST  01/06/2020  . IR ANGIO INTRA EXTRACRAN SEL INTERNAL CAROTID BILAT MOD SED  01/06/2020  . IR ANGIO VERTEBRAL SEL VERTEBRAL UNI L MOD SED  01/06/2020  . IR ANGIOGRAM FOLLOW UP STUDY  01/06/2020  . IR ANGIOGRAM FOLLOW UP STUDY  01/06/2020  . IR ANGIOGRAM FOLLOW UP STUDY  01/06/2020  . IR ANGIOGRAM FOLLOW UP STUDY  01/06/2020  . IR ANGIOGRAM FOLLOW UP STUDY  01/06/2020  . IR ANGIOGRAM FOLLOW UP STUDY  01/06/2020  . IR ANGIOGRAM FOLLOW UP STUDY  01/06/2020  . IR ANGIOGRAM FOLLOW UP STUDY  01/06/2020  . IR ANGIOGRAM FOLLOW UP STUDY  01/06/2020  . IR ANGIOGRAM FOLLOW UP STUDY  01/06/2020  .  IR NEURO EACH ADD'L AFTER BASIC UNI RIGHT (MS)  01/06/2020  . IR TRANSCATH/EMBOLIZ  01/06/2020  . RADIOLOGY WITH ANESTHESIA N/A 01/06/2020   Procedure: IR WITH ANESTHESIA FOR ANEURYSM;  Surgeon: Consuella Lose, MD;  Location: [facility];  Service: Radiology;  Laterality: N/A;  . TUBAL LIGATION      There were no vitals filed for this visit.   Subjective Assessment - 03/01/20 1108    Subjective Pt reports that she is doing ok. Right arm is biggest issue. Husband stays close to her whenever she is up.    Patient is accompained by: Family member   husband Ronalee Belts   Limitations Walking;Standing;House hold activities    How long can you walk comfortably? Did a little walking yesterday up/down the hallway    Patient Stated Goals Use my right hand like I used to.    Currently in Pain? Yes    Pain Score 2     Pain Location Arm    Pain Orientation Right    Pain Descriptors / Indicators Aching    Pain Type Chronic pain    Pain Onset 1 to 4 weeks ago    Pain Frequency Intermittent    Aggravating Factors  trying to move it  [facility] Adult PT Treatment/Exercise - 03/01/20 1110      Bed Mobility   Bed Mobility Sit to Supine;Supine to Sit    Supine to Sit Minimal Assistance - Patient > 75%   positioned pillow under right shoulder for comfort.   Sit to Supine Contact Guard/Touching assist      Transfers   Transfers Sit to Stand;Stand to Sit    Sit to Stand 5: Supervision    Sit to Stand Details Verbal cues for technique    Sit to Stand Details (indicate cue type and reason) Pt does need assist to place right hand in hand grip attachment. Pt was cued to lean foward and use her momentum to help with rising.    Stand to Sit 5: Supervision    Stand to Sit Details (indicate cue type and reason) Verbal cues for technique    Stand to Sit Details Reminder to remove right hand prior to sitting from grip attachment      Ambulation/Gait   Ambulation/Gait Yes     Ambulation/Gait Assistance 4: Min guard    Ambulation/Gait Assistance Details Pt reports trying to focus on keeping feet apart more. She reports that she is tired after. PT applied tennis balls to back legs of walker.    Ambulation Distance (Feet) 230 Feet    Assistive device Rolling walker   right hand grip attachment   Gait Pattern Step-through pattern;Narrow base of support    Ambulation Surface Level;Indoor      Standardized Balance Assessment   Standardized Balance Assessment Berg Balance Test      Berg Balance Test   Sit to Stand Able to stand  independently using hands    Standing Unsupported Able to stand safely 2 minutes    Sitting with Back Unsupported but Feet Supported on Floor or Stool Able to sit safely and securely 2 minutes    Stand to Sit Sits safely with minimal use of hands    Transfers Able to transfer safely, definite need of hands    Standing Unsupported with Eyes Closed Able to stand 10 seconds safely    Standing Ubsupported with Feet Together Able to place feet together independently and stand 1 minute safely    From Standing, Reach Forward with Outstretched Arm Can reach confidently >25 cm (10")    From Standing Position, Pick up Object from Floor Able to pick up shoe safely and easily    From Standing Position, Turn to Look Behind Over each Shoulder Turn sideways only but maintains balance    Turn 360 Degrees Able to turn 360 degrees safely but slowly    Standing Unsupported, Alternately Place Feet on Step/Stool Able to stand independently and safely and complete 8 steps in 20 seconds    Standing Unsupported, One Foot in Front Able to plae foot ahead of the other independently and hold 30 seconds    Standing on One Leg Tries to lift leg/unable to hold 3 seconds but remains standing independently   6 sec left, 2 sec right   Total Score 46      Exercises   Exercises Other Exercises    Other Exercises  Sit to stand 5 x 2 from mat with verbal cues to scoot forward  and lean to help with rising. Also cued to be sure to start with feet back under knees and spread apart some. Pt performed without hands from 20" mat. Supine bridges x 10 with pillow under right shoulder/arm to support due to pain. Pillow  helped.                   PT Education - 03/01/20 1259    Education Details Pt and husband instructed in HEP for sit to stand and bridges verbally as PT could not get to print. Also discussed positioning with RUE using pillow under shoulder for support when laying down. Also discussed possible adaptations to couch with adding folded over blanket to sit on or risers under legs of couch to raise up some. Pt to sit on left edge of couch so can push up with LUE.    Person(s) Educated Patient;Spouse    Methods Explanation;Demonstration            PT Short Term Goals - 03/01/20 1301      PT SHORT TERM GOAL #1   Title Pt/spouse will be independent with initial HEP in order to indicate decreased fall risk and improved functional mobility.  (Target Date: 03/26/20)    Time 4    Period Weeks    Status New    Target Date 03/26/20      PT SHORT TERM GOAL #2   Title Pt will complete BERG balance test and improve score by 6 points from baseline in order to indicate decreased fall risk.    Baseline 46/56 on 03/01/20    Time 4    Period Weeks    Status New      PT SHORT TERM GOAL #3   Title Pt will improve 5TSS to </=25 secs with single UE support in order to indicate dec fall risk and improved functional strength.    Time 4    Period Weeks    Status New      PT SHORT TERM GOAL #4   Title Pt will improve gait speed to >/=2.4 ft/sec with LRAD in order to indicate dec fall risk.    Time 4    Period Weeks    Status New      PT SHORT TERM GOAL #5   Title Pt will ambulate x 43' at S level with LRAD in simulated household environment in order to indicate improved independence in her home.    Time 4    Period Weeks    Status New             PT  Long Term Goals - 03/01/20 1302      PT LONG TERM GOAL #1   Title Pt/spouse will be independent with final HEP in order to indicate dec fall risk and improved functional mobility.  (Target Date: 04/25/20)    Time 8    Period Weeks    Status New      PT LONG TERM GOAL #2   Title Pt will improve BERG balance test score by 10 points from baseline in order to indicate decreased fall risk.    Baseline 03/01/20 46/56    Time 8    Period Weeks    Status New      PT LONG TERM GOAL #3   Title Pt will perform 5TSS in </=20 secs without UE support in order to indicate improved functional strength    Time 8    Period Weeks    Status New      PT LONG TERM GOAL #4   Title Pt will ambulate x 500' w/ LRAD at S level over unlevel paved and grassy surfaces, including ramp/curb in order to indicate safe community mobility.    Time 8  Period Weeks    Status New      PT LONG TERM GOAL #5   Title Pt will negotiate up/down 4 stairs with single L rail at mod I level in order to indicate safe home entry/exit.    Time 8    Period Weeks    Status New                 Plan - 03/01/20 1300    Clinical Impression Statement PT performed Merrilee Jansky today and pt has improved since leaving rehab at hospital to 46/56 indicating moderate fall risk. Pt most limited with standing activities due to right shoulder pain needing to support arm with left hand. Has OT eval tomorrow. May need to consider Givmohr sling for support as will likely progress off walker soon.    Personal Factors and Comorbidities Age;Comorbidity 1;Profession    Comorbidities B ICHs    Examination-Activity Limitations Bend;Carry;Lift;Locomotion Level;Reach Overhead;Squat;Stairs;Stand;Transfers    Examination-Participation Restrictions Cleaning;Community Activity;Driving;Occupation;Yard Work;Laundry;Meal Prep    Stability/Clinical Decision Making Evolving/Moderate complexity    Rehab Potential Good    PT Frequency 3x / week   then 2x/wk for  4 weeks   PT Duration 4 weeks   then 2x/wk for 4 weeks   PT Treatment/Interventions ADLs/Self Care Home Management;Aquatic Therapy;Electrical Stimulation;DME Instruction;Gait training;Stair training;Functional mobility training;Therapeutic activities;Therapeutic exercise;Balance training;Neuromuscular re-education;Cognitive remediation;Patient/family education;Orthotic Fit/Training;Manual techniques;Passive range of motion;Dry needling;Energy conservation;Vestibular;Visual/perceptual remediation/compensation    PT Next Visit Plan Print out medbridge HEP I created as my printer was not working. Add to it as able with more strengthening and standing balance. Try givmorh sling pending OT thought as well?  Focus on trunk and shoulder activation with gait and mobility tasks. Can we try tall kneeling with supporting right hand on bench? Want to try to see if once OT plan is made is it possible to transfer his care to [facility] when they get availability as they live in [place] They want things to do at home.  I would love to try and do things to incorporate her RUE since it is so tight.    Consulted and Agree with Plan of Care Patient;Family member/caregiver           Patient will benefit from skilled therapeutic intervention in order to improve the following deficits and impairments:  Abnormal gait, Decreased activity tolerance, Decreased balance, Decreased cognition, Decreased endurance, Decreased knowledge of use of DME, Decreased mobility, Decreased strength, Impaired perceived functional ability, Impaired flexibility, Impaired sensation, Postural dysfunction, Improper body mechanics, Impaired vision/preception, Impaired UE functional use, Impaired tone  Visit Diagnosis: Other abnormalities of gait and mobility  Muscle weakness (generalized)  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)     Problem List Patient Active  Problem List   Diagnosis Date Noted  . GIB (gastrointestinal bleeding) 02/28/2020  . Elevated BUN   . Prediabetes   . 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 Cerebral vasospasm   . Sinus tachycardia   . Dysphagia, post-stroke   . Thrombocytopenia ([place])   . Acute blood loss anemia   . Brain aneurysm   . ICH (intracerebral hemorrhage) ([place]) 01/05/2020    Electa Sniff, PT, DPT, NCS 03/01/2020, 1:11 PM  [facility] [address] [place], [place], [address] Phone: [phone]   Fax:  [phone]  Name: AJWA KIMBERLEY MRN: [id number] Date of Birth: June 13, 1955

## 2020-03-01 NOTE — Patient Instructions (Signed)
Access Code: [email or web address] URL: [email or web address] Date: 03/01/2020 Prepared by: Cherly Anderson  Exercises Sit to Stand - 2 x daily - 7 x weekly - 2 sets - 5 reps Supine Bridge - 2 x daily - 7 x weekly - 2 sets - 10 reps

## 2020-03-02 ENCOUNTER — Ambulatory Visit: Payer: BC Managed Care – PPO

## 2020-03-02 ENCOUNTER — Encounter: Payer: Self-pay | Admitting: Occupational Therapy

## 2020-03-02 ENCOUNTER — Ambulatory Visit: Payer: BC Managed Care – PPO | Admitting: Occupational Therapy

## 2020-03-02 ENCOUNTER — Ambulatory Visit: Payer: BC Managed Care – PPO | Admitting: Rehabilitation

## 2020-03-02 DIAGNOSIS — R2681 Unsteadiness on feet: Secondary | ICD-10-CM

## 2020-03-02 DIAGNOSIS — M25511 Pain in right shoulder: Secondary | ICD-10-CM

## 2020-03-02 DIAGNOSIS — M79641 Pain in right hand: Secondary | ICD-10-CM

## 2020-03-02 DIAGNOSIS — R41841 Cognitive communication deficit: Secondary | ICD-10-CM

## 2020-03-02 DIAGNOSIS — R1312 Dysphagia, oropharyngeal phase: Secondary | ICD-10-CM

## 2020-03-02 DIAGNOSIS — R278 Other lack of coordination: Secondary | ICD-10-CM

## 2020-03-02 DIAGNOSIS — I69159 Hemiplegia and hemiparesis following nontraumatic intracerebral hemorrhage affecting unspecified side: Secondary | ICD-10-CM

## 2020-03-02 DIAGNOSIS — R41842 Visuospatial deficit: Secondary | ICD-10-CM

## 2020-03-02 DIAGNOSIS — R2689 Other abnormalities of gait and mobility: Secondary | ICD-10-CM

## 2020-03-02 DIAGNOSIS — M6281 Muscle weakness (generalized): Secondary | ICD-10-CM

## 2020-03-02 DIAGNOSIS — R41844 Frontal lobe and executive function deficit: Secondary | ICD-10-CM

## 2020-03-02 DIAGNOSIS — R41840 Attention and concentration deficit: Secondary | ICD-10-CM

## 2020-03-02 NOTE — Therapy (Signed)
[facility] [address] [place], [place], [address] Phone: [phone]   Fax:  [phone]  Occupational Therapy Treatment  Patient Details  Name: Erika Stanton MRN: [id number] Date of Birth: Apr 20, 1956 Referring Provider (OT): Dr. Naaman Plummer   Encounter Date: 03/02/2020   OT End of Session - 03/02/20 1534    Visit Number 1    Number of Visits 25    Date for OT Re-Evaluation 05/25/20    Authorization Time Period State BCBS    OT Start Time 1320    OT Stop Time 1400    OT Time Calculation (min) 40 min           Past Medical History:  Diagnosis Date  . Melena     Past Surgical History:  Procedure Laterality Date  . CHOLECYSTECTOMY    . COLONOSCOPY  11/2017   at [facility]. no recurrent polyps.  suggest repeat surveillance study 11/2022.    Marland Kitchen COLONOSCOPY W/ POLYPECTOMY  06/2014   Dr Roxy Manns at [facility].  3 adenomatoous polyps, anal fissure.    . ESOPHAGOGASTRODUODENOSCOPY (EGD) WITH PROPOFOL N/A 01/27/2020   Procedure: ESOPHAGOGASTRODUODENOSCOPY (EGD) WITH PROPOFOL;  Surgeon: Mauri Pole, MD;  Location: [facility] ENDOSCOPY;  Service: Endoscopy;  Laterality: N/A;  . IR 3D INDEPENDENT WKST  01/06/2020  . IR ANGIO INTRA EXTRACRAN SEL INTERNAL CAROTID BILAT MOD SED  01/06/2020  . IR ANGIO VERTEBRAL SEL VERTEBRAL UNI L MOD SED  01/06/2020  . IR ANGIOGRAM FOLLOW UP STUDY  01/06/2020  . IR ANGIOGRAM FOLLOW UP STUDY  01/06/2020  . IR ANGIOGRAM FOLLOW UP STUDY  01/06/2020  . IR ANGIOGRAM FOLLOW UP STUDY  01/06/2020  . IR ANGIOGRAM FOLLOW UP STUDY  01/06/2020  . IR ANGIOGRAM FOLLOW UP STUDY  01/06/2020  . IR ANGIOGRAM FOLLOW UP STUDY  01/06/2020  . IR ANGIOGRAM FOLLOW UP STUDY  01/06/2020  . IR ANGIOGRAM FOLLOW UP STUDY  01/06/2020  . IR ANGIOGRAM FOLLOW UP STUDY  01/06/2020  . IR NEURO EACH ADD'L AFTER BASIC UNI RIGHT (MS)  01/06/2020  . IR TRANSCATH/EMBOLIZ  01/06/2020  . RADIOLOGY WITH ANESTHESIA N/A 01/06/2020   Procedure: IR WITH  ANESTHESIA FOR ANEURYSM;  Surgeon: Consuella Lose, MD;  Location: [facility];  Service: Radiology;  Laterality: N/A;  . TUBAL LIGATION      There were no vitals filed for this visit.   Subjective Assessment - 03/02/20 1329    Subjective  Pt reports significant right shoulder and hand pain    Currently in Pain? Yes    Pain Score 4     Pain Location Shoulder   hand   Pain Orientation Right    Pain Descriptors / Indicators Aching    Pain Type Acute pain    Pain Frequency Intermittent    Aggravating Factors  malpositioing    Pain Relieving Factors neurontin              [facility] OT Assessment - 03/02/20 0001      Assessment   Medical Diagnosis B ICHs    Referring Provider (OT) Dr. 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[employer], going out to eat      ADL   Eating/Feeding Minimal assistance -  using LUE   Grooming Moderate assistance    Upper Body Bathing Maximal assistance    Lower Body Bathing Maximal assistance    Upper Body Dressing Maximal assistance    Lower Body Dressing Moderate assistance    Toilet Transfer Minimal assistance    Tub/Shower Transfer Minimal assistance      IADL   Light Housekeeping Does not participate in any housekeeping tasks    Meal Prep Needs to have meals prepared and served    Medication Management -- -  family assists     Mobility   Mobility Status Needs assist -  minguard with RW     Written Expression   Dominant Hand Right    Handwriting -- -  unable     Vision Assessment   Vision Assessment Vision impaired  _ to be further tested in functional context     Comment hx of R inattention per hospital report, will further assess in functional context      Cognition   Overall Cognitive Status Impaired/Different from baseline    Area of Impairment Safety/judgement;Awareness;Problem solving    Awareness Impaired    Problem Solving Impaired    Behaviors Restless -  anxious     Posture/Postural Control   Posture/Postural Control Postural limitations    Postural Limitations Rounded Shoulders;Forward head;Posterior pelvic tilt;Weight shift left      Sensation   Light Touch Impaired Detail    Light Touch Impaired Details Impaired RUE    Hot/Cold Appears Intact    Proprioception Appears Intact     Coordination   9 Hole Peg Test Left    Right 9 Hole Peg Test -- -  unable   Left 9 Hole Peg Test 51.28 secs      Edema   Edema moderate edema in right hand, pt is very painful with RUE movement      ROM / Strength   AROM / PROM / Strength AROM      AROM   Overall AROM  Deficits    Overall AROM Comments RUE trace finger and wrist flexion present, 10% elbow ext, 25% elbow flexion , trace shoulder flexion- limited by pain and guarding, LUE shoulder flexion 100, elbow extension-10, full elbow flexion, wrist forearm and hand grossly WFLS for A/ROM.      Strength   Overall Strength Deficits    Overall Strength Comments not tested for UE      Hand Function   Right Hand Gross Grasp Impaired    Right Hand Grip (lbs) -- -  unable   Left Hand Grip (lbs) 13.6 lbs                    Treatment: Pt/ husband were instructed in retrograde massage for right hand and splint application. They verbalized understanding.          OT Short Term Goals - 03/02/20 1521      OT SHORT TERM GOAL #1   Title I with initialHEP.    Time 6    Period Weeks    Status New    Target Date 04/13/20      OT SHORT TERM GOAL #2   Title Pt will perform bathing with min A.    Time 6  Period Weeks    Status New      OT SHORT TERM GOAL #3   Title Pt will  donn shirt and pants with min A    Time 6    Period Weeks    Status New      OT SHORT TERM GOAL #4   Title Pt will demonstrate 30 A/ROM shoulder flexion for RUE with pain less than or equal to 4/10.    Time 6    Period Weeks    Status New      OT SHORT TERM GOAL #5   Title Pt will  demonstrate improved LUE fine motor coordination for ADLs as evidenced by decreasing 9 hole peg test score to 45 secs or less.    Time 6    Period Weeks    Status New      OT SHORT TERM GOAL #6   Title Pt/ caregiver will verbalize understanding of RUE positioning and edema control techniques to minimize pain and risk for injury    Time 6    Period Weeks    Status New      OT SHORT TERM GOAL #7   Title Pt will demonstrate at least 25% finger flexion/ extension in RUE in prep for functional use.    Time 6    Period Weeks    Status New             OT Long Term Goals - 03/02/20 1527      OT LONG TERM GOAL #1   Title Pt will perfrom  all basic ADLS with supervision    Time 12    Period Weeks    Status New      OT LONG TERM GOAL #2   Title Pt will demonstrate 45* RUE shoulder flexion in prep for functional reach with pain less than or equal to 3/10.    Time 12    Period Weeks    Status New      OT LONG TERM GOAL #3   Title Pt will use RUE as a stabilizer/ gross A at least 30 % of the time for ADLs/ IADLs.    Time 12    Period Weeks    Status New      OT LONG TERM GOAL #4   Title Pt will increase LUE grip strength to 25 lbs or greater for increased ease with ADLs.    Time 12    Period Weeks    Status New      OT LONG TERM GOAL #5   Title Pt will perform simple home management/ snack prep with min A.    Time 12    Period Weeks    Status New      Long Term Additional Goals   Additional Long Term Goals Yes      OT LONG TERM GOAL #6   Title Pt will demonstrate ability to grasp/ release a cup 2/3 trials with RUE.    Time 12    Period Weeks    Status New      OT LONG TERM GOAL  #7   Title Pt will safely navigate a busy environment and locate items with supervision and  90% or greater accuracy without bumping into items.    Time 12    Period Weeks    Status New      OT LONG TERM GOAL #8   Title I with updated HEP.    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 Pt underwent coil embolization of RICA aneurysm on 8/25 with additional finding of LICA aneurysm. Pt received therapies at [facility] and now she is at home with her husband. Pt with no significant previous medical history to note.   Pt presents with the following deficits: R hemiplegia, LUE weakness, coordination deficits, pain, decreased ROM, edema, visual perceptual deficits, cognitve deficits, decreased balance, and decreased functional mobility. Pt will benefit from skilled OP neuro OT  to address deficits in order to maximize pt's safety and I with ADLS/ IADLS. 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    OT Occupational Profile and History Detailed Assessment- Review of Records and additional review of physical, cognitive, psychosocial history related to current functional performance    Occupational performance deficits (Please refer to evaluation for details): ADL's;IADL's;Rest and Sleep;Work;Play;Leisure    Body Structure / Function / Physical Skills ADL;Balance;Coordination;Decreased knowledge of precautions;Decreased knowledge of use of DME;Dexterity;Edema;Mobility;Tone;Strength;IADL;Sensation;GMC;Gait;ROM;FMC;Flexibility;Pain;Vision;UE functional use;Endurance    Cognitive Skills Attention;Perception;Problem Solve;Safety Awareness;Thought    Rehab Potential Good    Clinical Decision Making Several treatment options, min-mod task modification necessary    Comorbidities Affecting Occupational  Performance: None    Modification or Assistance to Complete Evaluation  Min-Moderate modification of tasks or assist with assess necessary to complete eval    OT Frequency 2x / week    OT Duration 12 weeks    OT Treatment/Interventions Self-care/ADL training;Ultrasound;Energy conservation;Visual/perceptual remediation/compensation;Patient/family education;DME and/or AE instruction;Aquatic Therapy;Paraffin;Gait Training;Passive range of motion;Balance training;Fluidtherapy;Electrical Stimulation;Functional Mobility Training;Splinting;Moist Heat;Therapeutic exercise;Manual Therapy;Cognitive remediation/compensation;Manual lymph drainage;Neuromuscular education;Coping strategies training    Plan education regarding positioning to minimize RUE shoulder pain, gentle body on arm movements in supine    Consulted and Agree with Plan of Care Patient;Family member/caregiver           Patient will benefit from skilled therapeutic intervention in order to improve the following deficits and impairments:   Body Structure / Function / Physical Skills: ADL, Balance, Coordination, Decreased knowledge of precautions, Decreased knowledge of use of DME, Dexterity, Edema, Mobility, Tone, Strength, IADL, Sensation, GMC, Gait, ROM, FMC, Flexibility, Pain, Vision, UE functional use, Endurance Cognitive Skills: Attention, Perception, Problem Solve, Safety Awareness, Thought     Visit Diagnosis: 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, unspecified laterality ([place]) - Plan: Ot plan of care cert/re-cert  Muscle weakness (generalized) - Plan: Ot plan of care cert/re-cert  Other abnormalities of gait and mobility - Plan: Ot plan of care cert/re-cert  Unsteadiness on feet - Plan: Ot plan of care cert/re-cert  Acute pain of right shoulder - Plan: Ot plan of care cert/re-cert  Pain in right hand - Plan: Ot plan of care cert/re-cert  Other lack of coordination - Plan: Ot plan of care  cert/re-cert  Frontal lobe and executive function deficit - Plan: Ot plan of care cert/re-cert  Attention and concentration deficit - Plan: Ot plan of care cert/re-cert  Visuospatial deficit - Plan: Ot plan of care cert/re-cert    Problem List Patient Active Problem List   Diagnosis Date Noted  . GIB (gastrointestinal bleeding) 02/28/2020  . Elevated BUN   . Prediabetes   . 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[place]) 01/20/2020  . Cerebral  vasospasm   . Sinus tachycardia   . Dysphagia, post-stroke   . Thrombocytopenia ([place])   . Acute blood loss anemia   . Brain aneurysm   . ICH (intracerebral hemorrhage) ([place]) 01/05/2020    Erika Stanton 03/02/2020, 3:40 PM  [facility] [address] [place], [place], [address] Phone: [phone]   Fax:  [phone]  Name: Erika Stanton MRN: [id number] Date of Birth: 07-11-1955

## 2020-03-02 NOTE — Therapy (Signed)
[facility] [address] [place], [place], [address] Phone: [phone]   Fax:  [phone]  Speech Language Pathology Evaluation  Patient Details  Name: Erika Stanton MRN: [id number] Date of Birth: [birth year] Referring Provider (SLP): Alger Simons, MD   Encounter Date: 03/02/2020   End of Session - 03/02/20 1734    Visit Number 1    Number of Visits 17    Date for SLP Re-Evaluation 05/27/20   90 days   SLP Start Time 1404    SLP Stop Time  1445    SLP Time Calculation (min) 41 min    Activity Tolerance Patient tolerated treatment well           Past Medical History:  Diagnosis Date  . Melena     Past Surgical History:  Procedure Laterality Date  . CHOLECYSTECTOMY    . COLONOSCOPY  11/2017   at [facility]. no recurrent polyps.  suggest repeat surveillance study 11/2022.    Marland Kitchen COLONOSCOPY W/ POLYPECTOMY  06/2014   Dr Roxy Manns at [facility].  3 adenomatoous polyps, anal fissure.    . ESOPHAGOGASTRODUODENOSCOPY (EGD) WITH PROPOFOL N/A 01/27/2020   Procedure: ESOPHAGOGASTRODUODENOSCOPY (EGD) WITH PROPOFOL;  Surgeon: Mauri Pole, MD;  Location: [facility];  Service: Endoscopy;  Laterality: N/A;  . IR 3D INDEPENDENT WKST  01/06/2020  . IR ANGIO INTRA EXTRACRAN SEL INTERNAL CAROTID BILAT MOD SED  01/06/2020  . IR ANGIO VERTEBRAL SEL VERTEBRAL UNI L MOD SED  01/06/2020  . IR ANGIOGRAM FOLLOW UP STUDY  01/06/2020  . IR ANGIOGRAM FOLLOW UP STUDY  01/06/2020  . IR ANGIOGRAM FOLLOW UP STUDY  01/06/2020  . IR ANGIOGRAM FOLLOW UP STUDY  01/06/2020  . IR ANGIOGRAM FOLLOW UP STUDY  01/06/2020  . IR ANGIOGRAM FOLLOW UP STUDY  01/06/2020  . IR ANGIOGRAM FOLLOW UP STUDY  01/06/2020  . IR ANGIOGRAM FOLLOW UP STUDY  01/06/2020  . IR ANGIOGRAM FOLLOW UP STUDY  01/06/2020  . IR ANGIOGRAM FOLLOW UP STUDY  01/06/2020  . IR NEURO EACH ADD'L AFTER BASIC UNI RIGHT (MS)  01/06/2020  . IR TRANSCATH/EMBOLIZ  01/06/2020  . RADIOLOGY WITH ANESTHESIA  N/A 01/06/2020   Procedure: IR WITH ANESTHESIA FOR ANEURYSM;  Surgeon: Consuella Lose, MD;  Location: [facility];  Service: Radiology;  Laterality: N/A;  . TUBAL LIGATION      There were no vitals filed for this visit.       SLP Evaluation [facility] - 03/02/20 1356      SLP Visit Information   SLP Received On 03/02/20    Referring Provider (SLP) Alger Simons, MD    Onset Date 01-05-20    Medical Diagnosis Bilateral ICH      Subjective   Subjective "There are some things I do at work that I know I wouldnt be abel to do now."      Pain Assessment   Currently in Pain? Yes    Pain Score 4     Pain Location Shoulder    Pain Orientation Right    Pain Type Acute pain    Pain Frequency Intermittent    Pain Relieving Factors meds      General Information   HPI Pt presented with headache and lt hemiplegia on 01-05-20. 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 Stent and embolization  of rt ICA on 01-06-20. Pt rec'd ST on CIR for cognition and swallowing and was d/c'd on dys III/thin with supervision level cues for swallow precautions. Erika Stanton was d/c'd 02-23-20.    Mobility Status currently on PT caseload      Prior Functional Status   Cognitive/Linguistic Baseline Within functional limits    Type of Home House     Lives With Spouse    Vocation Full time employment   manages grant money for [employer]   Overall Cognitive Status Impaired/Different from baseline    Area of Impairment Awareness;Problem solving    Awareness Comments Emergent awareness appears deficient on clock drawing task with hand length and placement of numbers.     Problem Solving Comments Pt endorsed she was having vision difficulties in lt upper quadrant but did not make compensations for this during symbol cancellation task when 2/12  target symbols were on lt side.     Memory Appears intact   husband agrees pt memory appears much like premorbid status   Awareness --    Problem Solving Impaired      Auditory Comprehension   Overall Auditory Comprehension Appears within functional limits for tasks assessed      Verbal Expression   Overall Verbal Expression Appears within functional limits for tasks assessed      Written Expression   Dominant Hand Right      Oral Motor/Sensory Function   Overall Oral Motor/Sensory Function Appears within functional limits for tasks assessed      Motor Speech   Overall Motor Speech Appears within functional limits for tasks assessed                           SLP Education - 03/02/20 1733    Education Details complete eval next session, assess pt with salad next session, eval results thus far    Person(s) Educated Patient    Comprehension Verbalized understanding            SLP Short Term Goals - 03/02/20 1740      SLP SHORT TERM GOAL #1   Title pt to complete cognitive linguistic testing    Time 2    Period Weeks   or 4 total sessions   Status New      SLP SHORT TERM GOAL #2   Title pt will demo emergent awareness 100% with min complex therapy tasks x3 visits    Time 4    Period Weeks    Status New      SLP SHORT TERM GOAL #3   Title pt will demo appriopriate aspiration precuations with suggested POs x3 sessions    Time 4    Period Weeks    Status New      SLP SHORT TERM GOAL #4   Title pt will solve household probelms using functional solutions x3 sessions    Time 4    Period Weeks    Status New            SLP Long Term Goals - 03/02/20 1742      SLP LONG TERM GOAL #1   Title pt will demo anticipatory awareness by double checking all work x5 sessions    Time 8    Period Weeks   or 17 total sessions, for all LTGs     SLP LONG TERM GOAL #2   Title pt will follow swallow precautions with POs  with modified independence x3 sessions     Time 8    Period Weeks    Status New      SLP LONG TERM GOAL #3   Title pt will demo [employer] skills in solving mod complex problems in WNL amount of time with modified independence (double checking answers , etc)    Time 8    Period Weeks    Status New            Plan - 03/02/20 1734    Clinical Impression Statement Erika Stanton presents today with cognitive linguistic deficits demonstrated in awareness and simple executive function (problem solving) tasks. Husband states that pt's memory appears to be at baseline "but she hasn't really been challenged at home." Pt and husband agree pt manages her own meds, and Erika Stanton states she is aware of appointments. Cognitive testing will be completled next session or session after that (3rd total session), due to pt inquiry re: eating salads. She is to bring a typical salad next visit for SLP with which to assess pt's safety. 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 She is leaning towards early retirement.    Speech Therapy Frequency 2x / week    Duration 8 weeks   or 17 total sessions   Treatment/Interventions Aspiration precaution training;Pharyngeal strengthening exercises;Compensatory techniques;Diet toleration management by SLP;Trials of upgraded texture/liquids;Cueing hierarchy;Cognitive reorganization;Internal/external aids;Patient/family education;SLP instruction and feedback;Functional tasks    Potential to Achieve Goals Good    Consulted and Agree with Plan of Care Patient           Patient will benefit from skilled therapeutic intervention in order to improve the following deficits and impairments:   Dysphagia, oropharyngeal phase - Plan: SLP plan of care cert/re-cert  Cognitive communication deficit - Plan:  SLP plan of care cert/re-cert    Problem List Patient Active Problem List   Diagnosis Date Noted  . GIB (gastrointestinal bleeding) 02/28/2020  . Elevated BUN   . Prediabetes   . 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[place]) 01/20/2020  . Cerebral vasospasm   . Sinus tachycardia   . Dysphagia, post-stroke   . Thrombocytopenia ([place])   . Acute blood loss anemia   . Brain aneurysm   . ICH (intracerebral hemorrhage) ([place]) 01/05/2020    [facility], [place]  03/02/2020, 5:47 PM  [facility] [address] [place], [place], [address] Phone: [phone]   Fax:  [phone]  Name: Erika Stanton MRN: [id number] Date of Birth: August 29, 1955

## 2020-03-04 ENCOUNTER — Encounter: Payer: BC Managed Care – PPO | Admitting: Physical Medicine and Rehabilitation

## 2020-03-04 ENCOUNTER — Other Ambulatory Visit: Payer: Self-pay

## 2020-03-04 ENCOUNTER — Ambulatory Visit: Payer: BC Managed Care – PPO

## 2020-03-04 DIAGNOSIS — M6281 Muscle weakness (generalized): Secondary | ICD-10-CM

## 2020-03-04 DIAGNOSIS — R2681 Unsteadiness on feet: Secondary | ICD-10-CM | POA: Diagnosis not present

## 2020-03-04 DIAGNOSIS — R2689 Other abnormalities of gait and mobility: Secondary | ICD-10-CM

## 2020-03-04 NOTE — Therapy (Signed)
[facility] [address] [place], [place], [address] Phone: [phone]   Fax:  [phone]  Physical Therapy Treatment  Patient Details  Name: Erika Stanton MRN: [id number] Date of Birth: Jul 02, 1955 Referring Provider (PT): Alger Simons, MD   Encounter Date: 03/04/2020   PT End of Session - 03/04/20 1108    Visit Number 3    Number of Visits 21    Date for PT Re-Evaluation 04/25/20    Authorization Type BCBS State    PT Start Time 1105    PT Stop Time 1153    PT Time Calculation (min) 48 min    Equipment Utilized During Treatment Gait belt    Activity Tolerance Patient tolerated treatment well    Behavior During Therapy [facility] for tasks assessed/performed           Past Medical History:  Diagnosis Date  . Melena     Past Surgical History:  Procedure Laterality Date  . CHOLECYSTECTOMY    . COLONOSCOPY  11/2017   at [facility]. no recurrent polyps.  suggest repeat surveillance study 11/2022.    Marland Kitchen COLONOSCOPY W/ POLYPECTOMY  06/2014   Dr Roxy Manns at [facility].  3 adenomatoous polyps, anal fissure.    . ESOPHAGOGASTRODUODENOSCOPY (EGD) WITH PROPOFOL N/A 01/27/2020   Procedure: ESOPHAGOGASTRODUODENOSCOPY (EGD) WITH PROPOFOL;  Surgeon: Mauri Pole, MD;  Location: [facility];  Service: Endoscopy;  Laterality: N/A;  . IR 3D INDEPENDENT WKST  01/06/2020  . IR ANGIO INTRA EXTRACRAN SEL INTERNAL CAROTID BILAT MOD SED  01/06/2020  . IR ANGIO VERTEBRAL SEL VERTEBRAL UNI L MOD SED  01/06/2020  . IR ANGIOGRAM FOLLOW UP STUDY  01/06/2020  . IR ANGIOGRAM FOLLOW UP STUDY  01/06/2020  . IR ANGIOGRAM FOLLOW UP STUDY  01/06/2020  . IR ANGIOGRAM FOLLOW UP STUDY  01/06/2020  . IR ANGIOGRAM FOLLOW UP STUDY  01/06/2020  . IR ANGIOGRAM FOLLOW UP STUDY  01/06/2020  . IR ANGIOGRAM FOLLOW UP STUDY  01/06/2020  . IR ANGIOGRAM FOLLOW UP STUDY  01/06/2020  . IR ANGIOGRAM FOLLOW UP STUDY  01/06/2020  . IR ANGIOGRAM FOLLOW UP STUDY  01/06/2020  .  IR NEURO EACH ADD'L AFTER BASIC UNI RIGHT (MS)  01/06/2020  . IR TRANSCATH/EMBOLIZ  01/06/2020  . RADIOLOGY WITH ANESTHESIA N/A 01/06/2020   Procedure: IR WITH ANESTHESIA FOR ANEURYSM;  Surgeon: Consuella Lose, MD;  Location: [facility];  Service: Radiology;  Laterality: N/A;  . TUBAL LIGATION      There were no vitals filed for this visit.   Subjective Assessment - 03/04/20 1109    Subjective Pt's husband voicing frustration about not starting OT quickly after eval yesterday. Pt reports she is still very concerned about arm.    Patient is accompained by: Family member   husband Ronalee Belts   Limitations Walking;Standing;House hold activities    How long can you walk comfortably? Did a little walking yesterday up/down the hallway    Patient Stated Goals Use my right hand like I used to.    Currently in Pain? Yes    Pain Score 0-No pain   no pain at rest   Pain Location Arm    Pain Orientation Right    Pain Descriptors / Indicators Aching;Sharp    Pain Type Acute pain    Pain Onset 1 to 4 weeks ago    Aggravating Factors  movement, malpositioning  [facility] Adult PT Treatment/Exercise - 03/04/20 1112      Ambulation/Gait   Ambulation/Gait Yes    Ambulation/Gait Assistance 4: Min guard    Ambulation/Gait Assistance Details Pt initially tried to put right fingers in pocket of jacket but was sliding out. PT switched to holding right hand to try to get more pronation and slight support to allow some weight bearing through arm. Pt ambulated another 32' with PT providing support under left forearm trying to facilitate some arm swing on right. PT demonstrated to husband on how he can assist with supporting arm with gait at home as he is adamant that walker does not work in their home.     Ambulation Distance (Feet) 345 Feet   115   Assistive device None    Gait Pattern Step-through pattern;Narrow base of support    Ambulation Surface Level;Indoor      Neuro  Re-ed    Neuro Re-ed Details  Alternating taps on 4" step x 10 with PT supporting right arm in pronated position. Pt reported some pain in right knee so switched to just hip flexion on right x 10 then tapping 3 different colored cones with RLE x 10. Pt denied any pain in arm with the support PT provided.      Exercises   Exercises Other Exercises    Other Exercises  Seated bilateral scapular retraction  x 10 with PT helping to facilitate on right and verbal cues to relax upper trap. Pt with minimal motion on right but did improve some with cues to stick out chest. Standing at counter to support right hand on counter: sidestepping 6' x 4 with PT assisting to slide right arm along counter as went along. Standing at counter with PT assisting pt with AAROM to slide right arm forward on counter in pronation and then slide back. PT demonstrated to husband and provided tactile cues at elbow/shoulder and verbal cues to not twist trunk to help. Performed x 10. Pt denied any pain with this motion. Instructed they could try this at home as they stated sitting at table did not work as was too high for arm.                  PT Education - 03/04/20 1354    Education Details PT issued written HEP. Also verbally discussed and demonstrated with pt and husband active assisted/PROM shoulder flexion/ext standing at counter with sliding pronated hand forward and back to tolerance with husband assisting. They stated was not working at table. PT also educated husband on how he could support right arm when walking without walker in home as he is always with her when she is up. Stressed importance of not puling on arm. Also discussed plan to try to transition them to [facility] closer to home as soon as appointments open up.    Person(s) Educated Patient;Spouse    Methods Explanation;Demonstration;Handout    Comprehension Verbalized understanding            PT Short Term Goals - 03/01/20 1301      PT SHORT TERM GOAL  #1   Title Pt/spouse will be independent with initial HEP in order to indicate decreased fall risk and improved functional mobility.  (Target Date: 03/26/20)    Time 4    Period Weeks    Status New    Target Date 03/26/20      PT SHORT TERM GOAL #2   Title Pt will complete BERG balance test and improve  score by 6 points from baseline in order to indicate decreased fall risk.    Baseline 46/56 on 03/01/20    Time 4    Period Weeks    Status New      PT SHORT TERM GOAL #3   Title Pt will improve 5TSS to </=25 secs with single UE support in order to indicate dec fall risk and improved functional strength.    Time 4    Period Weeks    Status New      PT SHORT TERM GOAL #4   Title Pt will improve gait speed to >/=2.4 ft/sec with LRAD in order to indicate dec fall risk.    Time 4    Period Weeks    Status New      PT SHORT TERM GOAL #5   Title Pt will ambulate x 22' at S level with LRAD in simulated household environment in order to indicate improved independence in her home.    Time 4    Period Weeks    Status New             PT Long Term Goals - 03/01/20 1302      PT LONG TERM GOAL #1   Title Pt/spouse will be independent with final HEP in order to indicate dec fall risk and improved functional mobility.  (Target Date: 04/25/20)    Time 8    Period Weeks    Status New      PT LONG TERM GOAL #2   Title Pt will improve BERG balance test score by 10 points from baseline in order to indicate decreased fall risk.    Baseline 03/01/20 46/56    Time 8    Period Weeks    Status New      PT LONG TERM GOAL #3   Title Pt will perform 5TSS in </=20 secs without UE support in order to indicate improved functional strength    Time 8    Period Weeks    Status New      PT LONG TERM GOAL #4   Title Pt will ambulate x 500' w/ LRAD at S level over unlevel paved and grassy surfaces, including ramp/curb in order to indicate safe community mobility.    Time 8    Period Weeks     Status New      PT LONG TERM GOAL #5   Title Pt will negotiate up/down 4 stairs with single L rail at mod I level in order to indicate safe home entry/exit.    Time 8    Period Weeks    Status New                 Plan - 03/04/20 1357    Clinical Impression Statement PT worked on gait without AD. Pt had no LOB during gait. Tried to focus on involving arm more with activities and ways that husband could assist to support arm as well. Pt tolerated more pronated position better today with minimal support. Still does not tolerate right arm hanging at side due to pain. OT had wanted to wait on [place] sling for time being.    Personal Factors and Comorbidities Age;Comorbidity 1;Profession    Comorbidities B ICHs    Examination-Activity Limitations Bend;Carry;Lift;Locomotion Level;Reach Overhead;Squat;Stairs;Stand;Transfers    Examination-Participation Restrictions Cleaning;Community Activity;Driving;Occupation;Yard Work;Laundry;Meal Prep    Stability/Clinical Decision Making Evolving/Moderate complexity    Rehab Potential Good    PT Frequency 3x / week   then 2x/wk  for 4 weeks   PT Duration 4 weeks   then 2x/wk for 4 weeks   PT Treatment/Interventions ADLs/Self Care Home Management;Aquatic Therapy;Electrical Stimulation;DME Instruction;Gait training;Stair training;Functional mobility training;Therapeutic activities;Therapeutic exercise;Balance training;Neuromuscular re-education;Cognitive remediation;Patient/family education;Orthotic Fit/Training;Manual techniques;Passive range of motion;Dry needling;Energy conservation;Vestibular;Visual/perceptual remediation/compensation    PT Next Visit Plan Focus on trunk and shoulder activation with gait and mobility tasks. Can we try tall kneeling with supporting right hand on bench? Continue gait without AD and balance training as well as functional strengthening. Pearline Cables was going to call Monday to check with trying to transition to [facility] for all therapies  as they closed early Friday. Husband frustrated with lack of OT visits.    PT Home Exercise Plan They want things to do at home.  I would love to try and do things to incorporate her RUE since it is so tight.    Consulted and Agree with Plan of Care Patient;Family member/caregiver           Patient will benefit from skilled therapeutic intervention in order to improve the following deficits and impairments:  Abnormal gait, Decreased activity tolerance, Decreased balance, Decreased cognition, Decreased endurance, Decreased knowledge of use of DME, Decreased mobility, Decreased strength, Impaired perceived functional ability, Impaired flexibility, Impaired sensation, Postural dysfunction, Improper body mechanics, Impaired vision/preception, Impaired UE functional use, Impaired tone  Visit Diagnosis: Other abnormalities of gait and mobility  Muscle weakness (generalized)     Problem List Patient Active Problem List   Diagnosis Date Noted  . GIB (gastrointestinal bleeding) 02/28/2020  . Elevated BUN   . Prediabetes   . 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[place]) 01/20/2020  . Cerebral vasospasm   . Sinus tachycardia   . Dysphagia, post-stroke   . Thrombocytopenia ([place])   . Acute blood loss anemia   . Brain aneurysm   . ICH (intracerebral hemorrhage) ([place]) 01/05/2020    Electa Sniff, PT, DPT, NCS 03/04/2020, 2:01 PM  [facility] [address] [place], [place], [address] Phone: [phone]   Fax:  [phone]  Name: KADEIDRA CORYELL MRN: [id number] Date of Birth: June 16, 1955

## 2020-03-04 NOTE — Patient Instructions (Signed)
Access Code: [email or web address] URL: [email or web address] Date: 03/04/2020 Prepared by: Cherly Anderson  Exercises Sit to Stand - 2 x daily - 7 x weekly - 2 sets - 5 reps Supine Bridge - 2 x daily - 7 x weekly - 2 sets - 10 reps Seated Scapular Retraction - 2 x daily - 7 x weekly - 2 sets - 10 reps

## 2020-03-07 ENCOUNTER — Other Ambulatory Visit: Payer: Self-pay

## 2020-03-07 ENCOUNTER — Ambulatory Visit: Payer: BC Managed Care – PPO

## 2020-03-07 ENCOUNTER — Ambulatory Visit: Payer: BC Managed Care – PPO | Admitting: Physical Therapy

## 2020-03-07 ENCOUNTER — Encounter: Payer: Self-pay | Admitting: Physical Therapy

## 2020-03-07 DIAGNOSIS — R2681 Unsteadiness on feet: Secondary | ICD-10-CM | POA: Diagnosis not present

## 2020-03-07 DIAGNOSIS — R2689 Other abnormalities of gait and mobility: Secondary | ICD-10-CM

## 2020-03-07 DIAGNOSIS — R41841 Cognitive communication deficit: Secondary | ICD-10-CM

## 2020-03-07 DIAGNOSIS — M6281 Muscle weakness (generalized): Secondary | ICD-10-CM

## 2020-03-07 DIAGNOSIS — I69159 Hemiplegia and hemiparesis following nontraumatic intracerebral hemorrhage affecting unspecified side: Secondary | ICD-10-CM

## 2020-03-07 DIAGNOSIS — R1312 Dysphagia, oropharyngeal phase: Secondary | ICD-10-CM

## 2020-03-07 NOTE — Patient Instructions (Signed)
You are safe for eating salads at this time. Please make sure you have smaller bite sizes to ensure your safety.

## 2020-03-07 NOTE — Therapy (Signed)
[facility] [address] [place], [place], [address] Phone: [phone]   Fax:  [phone]  Physical Therapy Treatment  Patient Details  Name: Erika Stanton MRN: [id number] Date of Birth: May 18, 1955 Referring Provider (PT): Alger Simons, MD   Encounter Date: 03/07/2020   PT End of Session - 03/07/20 1336    Visit Number 4    Number of Visits 21    Date for PT Re-Evaluation 04/25/20    Authorization Type BCBS State    PT Start Time 1150    PT Stop Time 1240    PT Time Calculation (min) 50 min    Equipment Utilized During Treatment Gait belt    Activity Tolerance Patient tolerated treatment well    Behavior During Therapy [facility] for tasks assessed/performed           Past Medical History:  Diagnosis Date  . Melena     Past Surgical History:  Procedure Laterality Date  . CHOLECYSTECTOMY    . COLONOSCOPY  11/2017   at [facility]. no recurrent polyps.  suggest repeat surveillance study 11/2022.    Marland Kitchen COLONOSCOPY W/ POLYPECTOMY  06/2014   Dr Roxy Manns at [facility].  3 adenomatoous polyps, anal fissure.    . ESOPHAGOGASTRODUODENOSCOPY (EGD) WITH PROPOFOL N/A 01/27/2020   Procedure: ESOPHAGOGASTRODUODENOSCOPY (EGD) WITH PROPOFOL;  Surgeon: Mauri Pole, MD;  Location: [facility];  Service: Endoscopy;  Laterality: N/A;  . IR 3D INDEPENDENT WKST  01/06/2020  . IR ANGIO INTRA EXTRACRAN SEL INTERNAL CAROTID BILAT MOD SED  01/06/2020  . IR ANGIO VERTEBRAL SEL VERTEBRAL UNI L MOD SED  01/06/2020  . IR ANGIOGRAM FOLLOW UP STUDY  01/06/2020  . IR ANGIOGRAM FOLLOW UP STUDY  01/06/2020  . IR ANGIOGRAM FOLLOW UP STUDY  01/06/2020  . IR ANGIOGRAM FOLLOW UP STUDY  01/06/2020  . IR ANGIOGRAM FOLLOW UP STUDY  01/06/2020  . IR ANGIOGRAM FOLLOW UP STUDY  01/06/2020  . IR ANGIOGRAM FOLLOW UP STUDY  01/06/2020  . IR ANGIOGRAM FOLLOW UP STUDY  01/06/2020  . IR ANGIOGRAM FOLLOW UP STUDY  01/06/2020  . IR ANGIOGRAM FOLLOW UP STUDY  01/06/2020  .  IR NEURO EACH ADD'L AFTER BASIC UNI RIGHT (MS)  01/06/2020  . IR TRANSCATH/EMBOLIZ  01/06/2020  . RADIOLOGY WITH ANESTHESIA N/A 01/06/2020   Procedure: IR WITH ANESTHESIA FOR ANEURYSM;  Surgeon: Consuella Lose, MD;  Location: [facility];  Service: Radiology;  Laterality: N/A;  . TUBAL LIGATION      There were no vitals filed for this visit.   Subjective Assessment - 03/07/20 1156    Subjective Husband worried about R hand and shoulder.  Afraid it is going to freeze up.  Denies falls.    Patient is accompained by: Family member   [name]   Limitations Walking;Standing;House hold activities    How long can you walk comfortably? Did a little walking yesterday up/down the hallway    Patient Stated Goals Use my right hand like I used to.    Currently in Pain? Yes    Pain Score 1     Pain Location Arm   and hand   Pain Orientation Right    Pain Descriptors / Indicators Aching;Other (Comment)   becomes sharp with movement   Pain Type Acute pain    Pain Onset 1 to 4 weeks ago    Aggravating Factors  excessive ROM    Pain Relieving Factors not moving  [facility] Adult PT Treatment/Exercise - 03/07/20 0001      Transfers   Transfers Sit to Stand;Stand to Sit    Sit to Stand 5: Supervision    Sit to Stand Details (indicate cue type and reason) needs assistance to secure R hand in walker attachment    Stand to Sit 5: Supervision;4: Min assist    Stand to Sit Details (indicate cue type and reason) Verbal cues for precautions/safety;Verbal cues for technique    Stand to Sit Details cues to turn completely to chair/mat before sititng and assist to take R hand off walker      Ambulation/Gait   Ambulation/Gait Yes    Ambulation/Gait Assistance 4: Min guard;5: Supervision    Ambulation Distance (Feet) 440 Feet   x 1 and 110 x 2   Assistive device Rolling walker;Other (Comment)   vs HHA of PTA on L side   Gait Pattern Step-through pattern;Narrow base of support    Ambulation Surface  Level;Indoor    Gait Comments Pt appears to have L knee instability with gait as well and tends to keep L knee flexed.  Visually appears L knee moves laterally upon weight bearing.  Did not perform any tests today to check knee stability.      Exercises   Exercises Other Exercises    Other Exercises  Seated bilateral scapular retraction  x 20 with PTA helping to facilitate on right and verbal cues to relax upper trap. Used pillow case under R arm to decrease friction. Pt with minimal motion on right but did improve some with cues to stick out chest. In same position working on shoulder forward flexion.  Worked within pt's pain tolerance to increase ROM.  Used breathing to work through pain to increase ROM.            Neuro re-ed Pt standing at edge of mat and moved onto bil knee with min-mod assist of PTA.  PTA manually facilitated weight shift to walk on knee further onto mat.  Place bil UE's on bench and worked on side<>side UE movements along with slight trunk rotation in this position.  Pt again needed cues and breathing to work through pain.  Pt able to maintain this position x 3 minutes.  Pt needed same assist to return to sititng on mat.          PT Short Term Goals - 03/01/20 1301      PT SHORT TERM GOAL #1   Title Pt/spouse will be independent with initial HEP in order to indicate decreased fall risk and improved functional mobility.  (Target Date: 03/26/20)    Time 4    Period Weeks    Status New    Target Date 03/26/20      PT SHORT TERM GOAL #2   Title Pt will complete BERG balance test and improve score by 6 points from baseline in order to indicate decreased fall risk.    Baseline 46/56 on 03/01/20    Time 4    Period Weeks    Status New      PT SHORT TERM GOAL #3   Title Pt will improve 5TSS to </=25 secs with single UE support in order to indicate dec fall risk and improved functional strength.    Time 4    Period Weeks    Status New      PT SHORT TERM GOAL  #4   Title Pt will improve gait speed to >/=2.4 ft/sec with LRAD in  order to indicate dec fall risk.    Time 4    Period Weeks    Status New      PT SHORT TERM GOAL #5   Title Pt will ambulate x 34' at S level with LRAD in simulated household environment in order to indicate improved independence in her home.    Time 4    Period Weeks    Status New             PT Long Term Goals - 03/01/20 1302      PT LONG TERM GOAL #1   Title Pt/spouse will be independent with final HEP in order to indicate dec fall risk and improved functional mobility.  (Target Date: 04/25/20)    Time 8    Period Weeks    Status New      PT LONG TERM GOAL #2   Title Pt will improve BERG balance test score by 10 points from baseline in order to indicate decreased fall risk.    Baseline 03/01/20 46/56    Time 8    Period Weeks    Status New      PT LONG TERM GOAL #3   Title Pt will perform 5TSS in </=20 secs without UE support in order to indicate improved functional strength    Time 8    Period Weeks    Status New      PT LONG TERM GOAL #4   Title Pt will ambulate x 500' w/ LRAD at S level over unlevel paved and grassy surfaces, including ramp/curb in order to indicate safe community mobility.    Time 8    Period Weeks    Status New      PT LONG TERM GOAL #5   Title Pt will negotiate up/down 4 stairs with single L rail at mod I level in order to indicate safe home entry/exit.    Time 8    Period Weeks    Status New                 Plan - 03/07/20 1336    Clinical Impression Statement Pt appears to have L knee instability as observed with gait but unable to perform tests due to time constraints today.  Pt hesitant for ROM of R shoulder but did well with cues and encouragement with AAROM on table.  Cont per poc.    Personal Factors and Comorbidities Age;Comorbidity 1;Profession    Comorbidities B ICHs    Examination-Activity Limitations Bend;Carry;Lift;Locomotion Level;Reach  Overhead;Squat;Stairs;Stand;Transfers    Examination-Participation Restrictions Cleaning;Community Activity;Driving;Occupation;Yard Work;Laundry;Meal Prep    Stability/Clinical Decision Making Evolving/Moderate complexity    Rehab Potential Good    PT Frequency 3x / week   then 2x/wk for 4 weeks   PT Duration 4 weeks   then 2x/wk for 4 weeks   PT Treatment/Interventions ADLs/Self Care Home Management;Aquatic Therapy;Electrical Stimulation;DME Instruction;Gait training;Stair training;Functional mobility training;Therapeutic activities;Therapeutic exercise;Balance training;Neuromuscular re-education;Cognitive remediation;Patient/family education;Orthotic Fit/Training;Manual techniques;Passive range of motion;Dry needling;Energy conservation;Vestibular;Visual/perceptual remediation/compensation    PT Next Visit Plan Erika Stanton-can you test for her L knee instability.  She tends to keep it flexed with gait and appears to have lateral instability?  Focus on trunk and shoulder activation with gait and mobility tasks. Continue gait without AD and balance training as well as functional strengthening. Erika Stanton was going to call Monday to check with trying to transition to [facility] for all therapies as they closed early Friday. Husband frustrated with lack of OT visits but thinks  he would like to remain here for therapy vs [place] as long as we can work on trying to get additional OT visits.    PT Home Exercise Plan They want things to do at home.  I would love to try and do things to incorporate her RUE since it is so tight.    Consulted and Agree with Plan of Care Patient;Family member/caregiver           Patient will benefit from skilled therapeutic intervention in order to improve the following deficits and impairments:  Abnormal gait, Decreased activity tolerance, Decreased balance, Decreased cognition, Decreased endurance, Decreased knowledge of use of DME, Decreased mobility, Decreased strength, Impaired perceived  functional ability, Impaired flexibility, Impaired sensation, Postural dysfunction, Improper body mechanics, Impaired vision/preception, Impaired UE functional use, Impaired tone  Visit Diagnosis: Other abnormalities of gait and mobility  Muscle weakness (generalized)  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 GIB (gastrointestinal bleeding) 02/28/2020  . Elevated BUN   . Prediabetes   . 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[place]) 01/20/2020  . Cerebral vasospasm   . Sinus tachycardia   . Dysphagia, post-stroke   . Thrombocytopenia ([place])   . Acute blood loss anemia   . Brain aneurysm   . ICH (intracerebral hemorrhage) ([place]) 01/05/2020    Erika Stanton, PTA [facility] 03/07/20 1:48 PM Phone: [phone] Fax: [facility] [address] [place], [place], [address] Phone: [phone]   Fax:  [phone]  Name: Erika Stanton MRN: [id number] Date of Birth: [birth year]

## 2020-03-07 NOTE — Therapy (Signed)
[facility] [address] [place], [place], [address] Phone: [phone]   Fax:  [phone]  Speech Language Pathology Treatment  Patient Details  Name: Erika Stanton MRN: [id number] Date of Birth: Jun 30, 1955 Referring Provider (SLP): Alger Simons, MD   Encounter Date: 03/07/2020   End of Session - 03/07/20 1712    Visit Number 2    Number of Visits 17    Date for SLP Re-Evaluation 05/27/20   90 days   SLP Start Time 15    SLP Stop Time  1101    SLP Time Calculation (min) 41 min    Activity Tolerance Patient tolerated treatment well           Past Medical History:  Diagnosis Date  . Melena     Past Surgical History:  Procedure Laterality Date  . CHOLECYSTECTOMY    . COLONOSCOPY  11/2017   at [facility]. no recurrent polyps.  suggest repeat surveillance study 11/2022.    Marland Kitchen COLONOSCOPY W/ POLYPECTOMY  06/2014   Dr Roxy Manns at [facility].  3 adenomatoous polyps, anal fissure.    . ESOPHAGOGASTRODUODENOSCOPY (EGD) WITH PROPOFOL N/A 01/27/2020   Procedure: ESOPHAGOGASTRODUODENOSCOPY (EGD) WITH PROPOFOL;  Surgeon: Mauri Pole, MD;  Location: [facility];  Service: Endoscopy;  Laterality: N/A;  . IR 3D INDEPENDENT WKST  01/06/2020  . IR ANGIO INTRA EXTRACRAN SEL INTERNAL CAROTID BILAT MOD SED  01/06/2020  . IR ANGIO VERTEBRAL SEL VERTEBRAL UNI L MOD SED  01/06/2020  . IR ANGIOGRAM FOLLOW UP STUDY  01/06/2020  . IR ANGIOGRAM FOLLOW UP STUDY  01/06/2020  . IR ANGIOGRAM FOLLOW UP STUDY  01/06/2020  . IR ANGIOGRAM FOLLOW UP STUDY  01/06/2020  . IR ANGIOGRAM FOLLOW UP STUDY  01/06/2020  . IR ANGIOGRAM FOLLOW UP STUDY  01/06/2020  . IR ANGIOGRAM FOLLOW UP STUDY  01/06/2020  . IR ANGIOGRAM FOLLOW UP STUDY  01/06/2020  . IR ANGIOGRAM FOLLOW UP STUDY  01/06/2020  . IR ANGIOGRAM FOLLOW UP STUDY  01/06/2020  . IR NEURO EACH ADD'L AFTER BASIC UNI RIGHT (MS)  01/06/2020  . IR TRANSCATH/EMBOLIZ  01/06/2020  . RADIOLOGY WITH ANESTHESIA  N/A 01/06/2020   Procedure: IR WITH ANESTHESIA FOR ANEURYSM;  Surgeon: Consuella Lose, MD;  Location: [facility];  Service: Radiology;  Laterality: N/A;  . TUBAL LIGATION      There were no vitals filed for this visit.          ADULT SLP TREATMENT - 03/07/20 1040      General Information   Behavior/Cognition Alert;Cooperative;Pleasant mood;Impulsive      Treatment Provided   Treatment provided Dysphagia      Dysphagia Treatment   Temperature Spikes Noted No    Respiratory Status Room air    Oral Cavity - Dentition Adequate natural dentition    Treatment Methods Skilled observation;Patient/caregiver education    Patient observed directly with PO's Yes    Type of PO's observed Regular;Thin liquids    Feeding Needs set up;Needs assist   due to rt hemiparesis   Liquids provided via --   bottle   Oral Phase Signs & Symptoms --   none   Pharyngeal Phase Signs & Symptoms --   none   Other treatment/comments Pt brought in a salad with lettuce, tomatoes, small cheese pieces, and some creamy dressing. No overt s/s difficulties orally or pharyngeally. SLP suspects pt's difficlties, if seen, would be due to impulsivity/attention. SPL told pt she was safe to eat  salad. She questioned nuts. SLP cautioned her with nuts but invited her to bring in some nuts for SLP to observe her with these as well. SLP told pt that she was safer with smaller bite sizes, especially with salad. She acknowledged understanding.       Cognitive-Linquistic Treatment   Treatment focused on Cognition    Skilled Treatment Pt impulsive on directions for trail making (started making trails as SLP telling her the SLP action to connect circles).  Emergent awareness was ID'd with trail making but pt did not attempt to correct when told she could do so. Pt with decr'd problem solving skills noted with trailmaking as she noted there was an error but said, "I don't think I can figure that out." SLP provided pt and husband some  apps for pt to do at home for attention, including talk path therapy and talk path news. SLP enouraged pt and husband to set up SLP to monitor her progress.      Assessment / Recommendations / Plan   Plan Continue with current plan of care   complete CLQT next session     Progression Toward Goals   Progression toward goals Progressing toward goals            SLP Education - 03/07/20 1711    Education Details apps for cognition, safe with salads given small bites, impulsivity and attention are two things that SLP notes are different following pt's CVA    Person(s) Educated Patient;Spouse    Methods Explanation    Comprehension Verbalized understanding;Need further instruction            SLP Short Term Goals - 03/07/20 1715      SLP SHORT TERM GOAL #1   Title pt to complete cognitive linguistic testing    Time 2    Period Weeks   or 4 total sessions   Status On-going      SLP SHORT TERM GOAL #2   Title pt will demo emergent awareness 100% with min complex therapy tasks x3 visits    Time 4    Period Weeks    Status On-going      SLP SHORT TERM GOAL #3   Title pt will demo appriopriate aspiration precuations with suggested POs x3 sessions    Time 4    Period Weeks    Status On-going      SLP SHORT TERM GOAL #4   Title pt will solve household probelms using functional solutions x3 sessions    Time 4    Period Weeks    Status On-going            SLP Long Term Goals - 03/07/20 1715      SLP LONG TERM GOAL #1   Title pt will demo anticipatory awareness by double checking all work x5 sessions    Time 8    Period Weeks   or 17 total sessions, for all LTGs   Status On-going      SLP LONG TERM GOAL #2   Title pt will follow swallow precautions with POs with modified independence x3 sessions    Time 8    Period Weeks    Status On-going      SLP LONG TERM GOAL #3   Title pt will demo [facility] skills in solving mod complex problems in WNL amount of time with modified  independence (double checking answers , etc)    Time 8    Period Weeks  Status On-going            Plan - 03/07/20 1713    Clinical Impression Statement Courteney was assessed for safety with salad today in session and appears safe - no wet voice, coughing, or throat clearing with bites x12. Pt cont with decr'd awareness and simple executive function (problem solving) tasks. Cognitive testing will be completled next session. 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 She is leaning towards early retirement.    Speech Therapy Frequency 2x / week    Duration 8 weeks   or 17 total sessions   Treatment/Interventions Aspiration precaution training;Pharyngeal strengthening exercises;Compensatory techniques;Diet toleration management by SLP;Trials of upgraded texture/liquids;Cueing hierarchy;Cognitive reorganization;Internal/external aids;Patient/family education;SLP instruction and feedback;Functional tasks    Potential to Achieve Goals Good    Consulted and Agree with Plan of Care Patient           Patient will benefit from skilled therapeutic intervention in order to improve the following deficits and impairments:   Cognitive communication deficit  Dysphagia, oropharyngeal phase    Problem List Patient Active Problem List   Diagnosis Date Noted  . GIB (gastrointestinal bleeding) 02/28/2020  . Elevated BUN   . Prediabetes   . 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[place]) 01/20/2020  . Cerebral vasospasm   . Sinus tachycardia   . Dysphagia, post-stroke   . Thrombocytopenia ([place])   . Acute blood loss anemia   . Brain aneurysm   . ICH (intracerebral hemorrhage) ([place]) 01/05/2020    [facility] ,[place], [place]  03/07/2020, 5:16 PM  [facility] [address] [place], [place], [address] Phone: [phone]   Fax:  [phone]   Name: YONA KOSEK MRN: [id number] Date of Birth: 1955/10/23

## 2020-03-08 ENCOUNTER — Ambulatory Visit: Payer: BC Managed Care – PPO | Admitting: Occupational Therapy

## 2020-03-08 ENCOUNTER — Ambulatory Visit: Payer: BC Managed Care – PPO

## 2020-03-08 DIAGNOSIS — R2681 Unsteadiness on feet: Secondary | ICD-10-CM | POA: Diagnosis not present

## 2020-03-08 DIAGNOSIS — M25511 Pain in right shoulder: Secondary | ICD-10-CM

## 2020-03-08 DIAGNOSIS — I69159 Hemiplegia and hemiparesis following nontraumatic intracerebral hemorrhage affecting unspecified side: Secondary | ICD-10-CM

## 2020-03-08 DIAGNOSIS — M6281 Muscle weakness (generalized): Secondary | ICD-10-CM

## 2020-03-08 DIAGNOSIS — M79641 Pain in right hand: Secondary | ICD-10-CM

## 2020-03-08 DIAGNOSIS — R2689 Other abnormalities of gait and mobility: Secondary | ICD-10-CM

## 2020-03-08 NOTE — Patient Instructions (Signed)
Access Code: [email or web address] URL: [email or web address] Date: 03/08/2020 Prepared by: Cherly Anderson  Exercises Sit to Stand - 2 x daily - 7 x weekly - 2 sets - 5 reps Supine Bridge - 2 x daily - 7 x weekly - 2 sets - 10 reps Seated Scapular Retraction - 2 x daily - 7 x weekly - 2 sets - 10 reps Side Stepping with Counter Support - 2 x daily - 7 x weekly - 1 sets - 4-6 reps Walking March - 2 x daily - 7 x weekly - 1 sets - 4-6 reps

## 2020-03-08 NOTE — Therapy (Signed)
[facility] [address] [place], [place], [address] Phone: [phone]   Fax:  [phone]  Occupational Therapy Treatment  Patient Details  Name: Erika Stanton MRN: [id number] Date of Birth: 1956/04/01 Referring Provider (OT): Dr. Naaman Plummer   Encounter Date: 03/08/2020   OT End of Session - 03/08/20 1458    Visit Number 2    Number of Visits 25    Date for OT Re-Evaluation 05/25/20    Authorization Time [place]    OT Start Time 1402    OT Stop Time 1445    OT Time Calculation (min) 43 min    Behavior During Therapy [facility] for tasks assessed/performed           Past Medical History:  Diagnosis Date  . Melena     Past Surgical History:  Procedure Laterality Date  . CHOLECYSTECTOMY    . COLONOSCOPY  11/2017   at [facility]. no recurrent polyps.  suggest repeat surveillance study 11/2022.    Marland Kitchen COLONOSCOPY W/ POLYPECTOMY  06/2014   Dr Roxy Manns at [facility].  3 adenomatoous polyps, anal fissure.    . ESOPHAGOGASTRODUODENOSCOPY (EGD) WITH PROPOFOL N/A 01/27/2020   Procedure: ESOPHAGOGASTRODUODENOSCOPY (EGD) WITH PROPOFOL;  Surgeon: Mauri Pole, MD;  Location: [facility];  Service: Endoscopy;  Laterality: N/A;  . IR 3D INDEPENDENT WKST  01/06/2020  . IR ANGIO INTRA EXTRACRAN SEL INTERNAL CAROTID BILAT MOD SED  01/06/2020  . IR ANGIO VERTEBRAL SEL VERTEBRAL UNI L MOD SED  01/06/2020  . IR ANGIOGRAM FOLLOW UP STUDY  01/06/2020  . IR ANGIOGRAM FOLLOW UP STUDY  01/06/2020  . IR ANGIOGRAM FOLLOW UP STUDY  01/06/2020  . IR ANGIOGRAM FOLLOW UP STUDY  01/06/2020  . IR ANGIOGRAM FOLLOW UP STUDY  01/06/2020  . IR ANGIOGRAM FOLLOW UP STUDY  01/06/2020  . IR ANGIOGRAM FOLLOW UP STUDY  01/06/2020  . IR ANGIOGRAM FOLLOW UP STUDY  01/06/2020  . IR ANGIOGRAM FOLLOW UP STUDY  01/06/2020  . IR ANGIOGRAM FOLLOW UP STUDY  01/06/2020  . IR NEURO EACH ADD'L AFTER BASIC UNI RIGHT (MS)  01/06/2020  . IR TRANSCATH/EMBOLIZ  01/06/2020  .  RADIOLOGY WITH ANESTHESIA N/A 01/06/2020   Procedure: IR WITH ANESTHESIA FOR ANEURYSM;  Surgeon: Consuella Lose, MD;  Location: [facility];  Service: Radiology;  Laterality: N/A;  . TUBAL LIGATION      There were no vitals filed for this visit.           Treatment: discussion with pt/ husband about the availability to OT at this clinic vs. [facility],. [facility] can start PT/OT next week but does not have ST for several weeks. Currently pt/ husband agree with continuing therapy at this site. Supine hotpack applied to right shoulder with gentle joint mobs followed by pt education in HEP. Pt was very fearlful initially then once she became distracted by body on arm movements in supine she complained of less pain.             OT Education - 03/08/20 1548    Education Details Pt/ husband were educated regarding beginning HEP, pt/ husband verbalize understanding. see pt instuctions.    Person(s) Educated Patient;Spouse    Methods Explanation;Demonstration;Verbal cues;Handout    Comprehension Verbalized understanding;Returned demonstration;Verbal cues required            OT Short Term Goals - 03/02/20 1521      OT SHORT TERM GOAL #1   Title I with initialHEP.  Time 6    Period Weeks    Status New    Target Date 04/13/20      OT SHORT TERM GOAL #2   Title Pt will perform bathing with min A.    Time 6    Period Weeks    Status New      OT SHORT TERM GOAL #3   Title Pt will donn shirt and pants with min A    Time 6    Period Weeks    Status New      OT SHORT TERM GOAL #4   Title Pt will demonstrate 30 A/ROM shoulder flexion for RUE with pain less than or equal to 4/10.    Time 6    Period Weeks    Status New      OT SHORT TERM GOAL #5   Title Pt will  demonstrate improved LUE fine motor coordination for ADLs as evidenced by decreasing 9 hole peg test score to 45 secs or less.    Time 6    Period Weeks    Status New      OT SHORT TERM GOAL #6   Title Pt/ caregiver  will verbalize understanding of RUE positioning and edema control techniques to minimize pain and risk for injury    Time 6    Period Weeks    Status New      OT SHORT TERM GOAL #7   Title Pt will demonstrate at least 25% finger flexion/ extension in RUE in prep for functional use.    Time 6    Period Weeks    Status New             OT Long Term Goals - 03/02/20 1527      OT LONG TERM GOAL #1   Title Pt will perfrom  all basic ADLS with supervision    Time 12    Period Weeks    Status New      OT LONG TERM GOAL #2   Title Pt will demonstrate 45* RUE shoulder flexion in prep for functional reach with pain less than or equal to 3/10.    Time 12    Period Weeks    Status New      OT LONG TERM GOAL #3   Title Pt will use RUE as a stabilizer/ gross A at least 30 % of the time for ADLs/ IADLs.    Time 12    Period Weeks    Status New      OT LONG TERM GOAL #4   Title Pt will increase LUE grip strength to 25 lbs or greater for increased ease with ADLs.    Time 12    Period Weeks    Status New      OT LONG TERM GOAL #5   Title Pt will perform simple home management/ snack prep with min A.    Time 12    Period Weeks    Status New      Long Term Additional Goals   Additional Long Term Goals Yes      OT LONG TERM GOAL #6   Title Pt will demonstrate ability to grasp/ release a cup 2/3 trials with RUE.    Time 12    Period Weeks    Status New      OT LONG TERM GOAL #7   Title Pt will safely navigate a busy environment and locate items with supervision and  90% or  greater accuracy without bumping into items.    Time 12    Period Weeks    Status New      OT LONG TERM GOAL #8   Title I with updated HEP.    Time 12    Period Weeks    Status New                 Plan - 03/08/20 1546    Clinical Impression Statement Pt is progressing towards goals. Pt is limited by pain and fear of RUE pain.    OT Occupational Profile and History Detailed Assessment- Review  of Records and additional review of physical, cognitive, psychosocial history related to current functional performance    Occupational performance deficits (Please refer to evaluation for details): ADL's;IADL's;Rest and Sleep;Work;Play;Leisure    Body Structure / Function / Physical Skills ADL;Balance;Coordination;Decreased knowledge of precautions;Decreased knowledge of use of DME;Dexterity;Edema;Mobility;Tone;Strength;IADL;Sensation;GMC;Gait;ROM;FMC;Flexibility;Pain;Vision;UE functional use;Endurance    Cognitive Skills Attention;Perception;Problem Solve;Safety Awareness;Thought    Rehab Potential Good    Clinical Decision Making Several treatment options, min-mod task modification necessary    Comorbidities Affecting Occupational Performance: None    Modification or Assistance to Complete Evaluation  Min-Moderate modification of tasks or assist with assess necessary to complete eval    OT Frequency 2x / week    OT Duration 12 weeks    OT Treatment/Interventions Self-care/ADL training;Ultrasound;Energy conservation;Visual/perceptual remediation/compensation;Patient/family education;DME and/or AE instruction;Aquatic Therapy;Paraffin;Gait Training;Passive range of motion;Balance training;Fluidtherapy;Electrical Stimulation;Functional Mobility Training;Splinting;Moist Heat;Therapeutic exercise;Manual Therapy;Cognitive remediation/compensation;Manual lymph drainage;Neuromuscular education;Coping strategies training    Plan continue education regarding positioning to minimize RUE shoulder pain, gentle body on arm movements in supine    Consulted and Agree with Plan of Care Patient;Family member/caregiver           Patient will benefit from skilled therapeutic intervention in order to improve the following deficits and impairments:   Body Structure / Function / Physical Skills: ADL, Balance, Coordination, Decreased knowledge of precautions, Decreased knowledge of use of DME, Dexterity, Edema,  Mobility, Tone, Strength, IADL, Sensation, GMC, Gait, ROM, FMC, Flexibility, Pain, Vision, UE functional use, Endurance Cognitive Skills: Attention, Perception, Problem Solve, Safety Awareness, Thought     Visit Diagnosis: 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 GIB (gastrointestinal bleeding) 02/28/2020  . Elevated BUN   . Prediabetes   . 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[place]) 01/20/2020  . Cerebral vasospasm   . Sinus tachycardia   . Dysphagia, post-stroke   . Thrombocytopenia ([place])   . Acute blood loss anemia   . Brain aneurysm   . ICH (intracerebral hemorrhage) ([place]) 01/05/2020    Noble Cicalese 03/08/2020, 3:54 PM  [facility] [address] [place], [place], [address] Phone: [phone]   Fax:  [phone]  Name: Erika Stanton MRN: [id number] Date of Birth: 1955/07/16

## 2020-03-08 NOTE — Patient Instructions (Addendum)
Lay on your back, try to relax your shoulders and shoulder blade back into the bed, then rock your legs side to side 10 reps  Roll over onto your right side with arm out to the side supported on the bed,  bend and straighten your right elbow 10 reps 2-3x day,  Then turn your palm up and 10 reps 2-3x day  Open and close your your right hand, assist with your left hand 10 reps 2-3x day

## 2020-03-08 NOTE — Therapy (Signed)
[facility] [address] [place], [place], [address] Phone: [phone]   Fax:  [phone]  Physical Therapy Treatment  Patient Details  Name: Erika Stanton MRN: [id number] Date of Birth: 06/26/1955 Referring Provider (PT): Alger Simons, MD   Encounter Date: 03/08/2020   PT End of Session - 03/08/20 1448    Visit Number 5    Number of Visits 21    Date for PT Re-Evaluation 04/25/20    Authorization Type BCBS State    PT Start Time 6378    PT Stop Time 1530    PT Time Calculation (min) 43 min    Equipment Utilized During Treatment Gait belt    Activity Tolerance Patient tolerated treatment well    Behavior During Therapy [facility] for tasks assessed/performed           Past Medical History:  Diagnosis Date  . Melena     Past Surgical History:  Procedure Laterality Date  . CHOLECYSTECTOMY    . COLONOSCOPY  11/2017   at [facility]. no recurrent polyps.  suggest repeat surveillance study 11/2022.    Marland Kitchen COLONOSCOPY W/ POLYPECTOMY  06/2014   Dr Roxy Manns at [facility].  3 adenomatoous polyps, anal fissure.    . ESOPHAGOGASTRODUODENOSCOPY (EGD) WITH PROPOFOL N/A 01/27/2020   Procedure: ESOPHAGOGASTRODUODENOSCOPY (EGD) WITH PROPOFOL;  Surgeon: Mauri Pole, MD;  Location: [facility];  Service: Endoscopy;  Laterality: N/A;  . IR 3D INDEPENDENT WKST  01/06/2020  . IR ANGIO INTRA EXTRACRAN SEL INTERNAL CAROTID BILAT MOD SED  01/06/2020  . IR ANGIO VERTEBRAL SEL VERTEBRAL UNI L MOD SED  01/06/2020  . IR ANGIOGRAM FOLLOW UP STUDY  01/06/2020  . IR ANGIOGRAM FOLLOW UP STUDY  01/06/2020  . IR ANGIOGRAM FOLLOW UP STUDY  01/06/2020  . IR ANGIOGRAM FOLLOW UP STUDY  01/06/2020  . IR ANGIOGRAM FOLLOW UP STUDY  01/06/2020  . IR ANGIOGRAM FOLLOW UP STUDY  01/06/2020  . IR ANGIOGRAM FOLLOW UP STUDY  01/06/2020  . IR ANGIOGRAM FOLLOW UP STUDY  01/06/2020  . IR ANGIOGRAM FOLLOW UP STUDY  01/06/2020  . IR ANGIOGRAM FOLLOW UP STUDY  01/06/2020  .  IR NEURO EACH ADD'L AFTER BASIC UNI RIGHT (MS)  01/06/2020  . IR TRANSCATH/EMBOLIZ  01/06/2020  . RADIOLOGY WITH ANESTHESIA N/A 01/06/2020   Procedure: IR WITH ANESTHESIA FOR ANEURYSM;  Surgeon: Consuella Lose, MD;  Location: [facility];  Service: Radiology;  Laterality: N/A;  . TUBAL LIGATION      There were no vitals filed for this visit.   Subjective Assessment - 03/08/20 1448    Subjective Pt reports that she is doing ok. Had a good OT session. Husband and pt decided for now they would like to stay at this clinic.    Patient is accompained by: Family member   husband Ronalee Belts   Limitations Walking;Standing;House hold activities    How long can you walk comfortably? Did a little walking yesterday up/down the hallway    Patient Stated Goals Use my right hand like I used to.    Currently in Pain? Yes    Pain Score 0-No pain   2-3/10 with movement   Pain Location Arm    Pain Orientation Right    Pain Descriptors / Indicators Aching    Pain Type Acute pain    Pain Onset 1 to 4 weeks ago    Pain Frequency Intermittent  [facility] Adult PT Treatment/Exercise - 03/08/20 1450      Transfers   Transfers Sit to Stand;Stand to Sit    Sit to Stand 5: Supervision    Stand to Sit 5: Supervision      Ambulation/Gait   Ambulation/Gait Yes    Ambulation/Gait Assistance 4: Min guard    Ambulation/Gait Assistance Details Pt was able to do better relaxing right arm at side today. Pt does not get full knee extension at heel strike.    Ambulation Distance (Feet) 345 Feet    Assistive device None    Gait Pattern Step-through pattern;Narrow base of support;Left flexed knee in stance;Right flexed knee in stance    Ambulation Surface Level;Indoor      Neuro Re-ed    Neuro Re-ed Details  Tall kneeling on mat with hands on blue bench to get some WB through RUE as well. PT assisted to pronate hand more, weight shifting side to side x 10, partial squats x 10, stepping  to side and back 2' x2 on knees. Sit to stand x 5 trying to relax right arm during transfer. Gait weaving in and out of 5 cones x 4 CGA, tapping cone and then stepping over x 2 bouts leading with each foot, side stepping over 5 cones x 2 laps with verbal cues to keep feet straight. At counter on blue mat: marching without UE support with verbal cues to take her time and increase SLS time, side stepping on mat 6' x 4 with verbal cues for form, standing with feet together x 30 sec eyes open then x 30 sec eyes closed, tandem stance x 30 sec each position CGA/min assist at times. Holding to counter: raising up on toes and back on heels x 10.                  PT Education - 03/08/20 2106    Education Details Added to HEP    Person(s) Educated Patient    Methods Explanation;Demonstration;Handout    Comprehension Verbalized understanding            PT Short Term Goals - 03/01/20 1301      PT SHORT TERM GOAL #1   Title Pt/spouse will be independent with initial HEP in order to indicate decreased fall risk and improved functional mobility.  (Target Date: 03/26/20)    Time 4    Period Weeks    Status New    Target Date 03/26/20      PT SHORT TERM GOAL #2   Title Pt will complete BERG balance test and improve score by 6 points from baseline in order to indicate decreased fall risk.    Baseline 46/56 on 03/01/20    Time 4    Period Weeks    Status New      PT SHORT TERM GOAL #3   Title Pt will improve 5TSS to </=25 secs with single UE support in order to indicate dec fall risk and improved functional strength.    Time 4    Period Weeks    Status New      PT SHORT TERM GOAL #4   Title Pt will improve gait speed to >/=2.4 ft/sec with LRAD in order to indicate dec fall risk.    Time 4    Period Weeks    Status New      PT SHORT TERM GOAL #5   Title Pt will ambulate x 10' at S level with LRAD in simulated household environment  in order to indicate improved independence in her home.     Time 4    Period Weeks    Status New             PT Long Term Goals - 03/01/20 1302      PT LONG TERM GOAL #1   Title Pt/spouse will be independent with final HEP in order to indicate dec fall risk and improved functional mobility.  (Target Date: 04/25/20)    Time 8    Period Weeks    Status New      PT LONG TERM GOAL #2   Title Pt will improve BERG balance test score by 10 points from baseline in order to indicate decreased fall risk.    Baseline 03/01/20 46/56    Time 8    Period Weeks    Status New      PT LONG TERM GOAL #3   Title Pt will perform 5TSS in </=20 secs without UE support in order to indicate improved functional strength    Time 8    Period Weeks    Status New      PT LONG TERM GOAL #4   Title Pt will ambulate x 500' w/ LRAD at S level over unlevel paved and grassy surfaces, including ramp/curb in order to indicate safe community mobility.    Time 8    Period Weeks    Status New      PT LONG TERM GOAL #5   Title Pt will negotiate up/down 4 stairs with single L rail at mod I level in order to indicate safe home entry/exit.    Time 8    Period Weeks    Status New                 Plan - 03/08/20 2107    Clinical Impression Statement PT assessed left knee as prior therapist concerned about instability. Negative anterior and posterior drawer. No joint line tenderness, No noted medial or lateral instability. Pt does have weakness on LLE from CVA as well and history of arthritis. Pt tolerated treatment well. She was able to exhibit less guarding at right shoulder with gait activities today.    Personal Factors and Comorbidities Age;Comorbidity 1;Profession    Comorbidities B ICHs    Examination-Activity Limitations Bend;Carry;Lift;Locomotion Level;Reach Overhead;Squat;Stairs;Stand;Transfers    Examination-Participation Restrictions Cleaning;Community Activity;Driving;Occupation;Yard Work;Laundry;Meal Prep    Stability/Clinical Decision Making  Evolving/Moderate complexity    Rehab Potential Good    PT Frequency 3x / week   then 2x/wk for 4 weeks   PT Duration 4 weeks   then 2x/wk for 4 weeks   PT Treatment/Interventions ADLs/Self Care Home Management;Aquatic Therapy;Electrical Stimulation;DME Instruction;Gait training;Stair training;Functional mobility training;Therapeutic activities;Therapeutic exercise;Balance training;Neuromuscular re-education;Cognitive remediation;Patient/family education;Orthotic Fit/Training;Manual techniques;Passive range of motion;Dry needling;Energy conservation;Vestibular;Visual/perceptual remediation/compensation    PT Next Visit Plan Focus on trunk and shoulder activation with gait and mobility tasks. Continue gait without AD and balance training as well as functional strengthening.    PT Home Exercise Plan They want things to do at home.  I would love to try and do things to incorporate her RUE since it is so tight.    Consulted and Agree with Plan of Care Patient;Family member/caregiver           Patient will benefit from skilled therapeutic intervention in order to improve the following deficits and impairments:  Abnormal gait, Decreased activity tolerance, Decreased balance, Decreased cognition, Decreased endurance, Decreased knowledge of use of DME, Decreased  mobility, Decreased strength, Impaired perceived functional ability, Impaired flexibility, Impaired sensation, Postural dysfunction, Improper body mechanics, Impaired vision/preception, Impaired UE functional use, Impaired tone  Visit Diagnosis: Other abnormalities of gait and mobility  Muscle weakness (generalized)     Problem List Patient Active Problem List   Diagnosis Date Noted  . GIB (gastrointestinal bleeding) 02/28/2020  . Elevated BUN   . Prediabetes   . 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[place]) 01/20/2020  . Cerebral vasospasm   . Sinus tachycardia   . Dysphagia, post-stroke   . Thrombocytopenia ([facility])   . Acute blood loss anemia     . Brain aneurysm   . ICH (intracerebral hemorrhage) ([place]) 01/05/2020    Electa Sniff, PT, DPT, NCS 03/08/2020, 9:13 PM  [facility] [address] [place], [place], [address] Phone: [phone]   Fax:  [phone]  Name: Erika Stanton MRN: [id number] Date of Birth: [birth year]

## 2020-03-10 DIAGNOSIS — Z8742 Personal history of other diseases of the female genital tract: Secondary | ICD-10-CM | POA: Insufficient documentation

## 2020-03-11 ENCOUNTER — Other Ambulatory Visit: Payer: Self-pay

## 2020-03-11 ENCOUNTER — Ambulatory Visit: Payer: BC Managed Care – PPO

## 2020-03-11 DIAGNOSIS — R41841 Cognitive communication deficit: Secondary | ICD-10-CM

## 2020-03-11 DIAGNOSIS — R2689 Other abnormalities of gait and mobility: Secondary | ICD-10-CM

## 2020-03-11 DIAGNOSIS — R2681 Unsteadiness on feet: Secondary | ICD-10-CM

## 2020-03-11 DIAGNOSIS — M6281 Muscle weakness (generalized): Secondary | ICD-10-CM

## 2020-03-11 DIAGNOSIS — R1312 Dysphagia, oropharyngeal phase: Secondary | ICD-10-CM

## 2020-03-11 NOTE — Therapy (Signed)
[facility] [address] [place], [place], [address] Phone: [phone]   Fax:  [phone]  Physical Therapy Treatment  Patient Details  Name: Erika Stanton MRN: [id number] Date of Birth: 01/20/1956 Referring Provider (PT): Alger Simons, MD   Encounter Date: 03/11/2020   PT End of Session - 03/11/20 1453    Visit Number 6    Number of Visits 21    Date for PT Re-Evaluation 04/25/20    Authorization Type BCBS State    PT Start Time 1450    PT Stop Time 1528    PT Time Calculation (min) 38 min    Equipment Utilized During Treatment Gait belt    Activity Tolerance Patient tolerated treatment well    Behavior During Therapy [facility] for tasks assessed/performed           Past Medical History:  Diagnosis Date  . Melena     Past Surgical History:  Procedure Laterality Date  . CHOLECYSTECTOMY    . COLONOSCOPY  11/2017   at [facility]. no recurrent polyps.  suggest repeat surveillance study 11/2022.    Marland Kitchen COLONOSCOPY W/ POLYPECTOMY  06/2014   Dr Roxy Manns at [facility].  3 adenomatoous polyps, anal fissure.    . ESOPHAGOGASTRODUODENOSCOPY (EGD) WITH PROPOFOL N/A 01/27/2020   Procedure: ESOPHAGOGASTRODUODENOSCOPY (EGD) WITH PROPOFOL;  Surgeon: Mauri Pole, MD;  Location: [facility];  Service: Endoscopy;  Laterality: N/A;  . IR 3D INDEPENDENT WKST  01/06/2020  . IR ANGIO INTRA EXTRACRAN SEL INTERNAL CAROTID BILAT MOD SED  01/06/2020  . IR ANGIO VERTEBRAL SEL VERTEBRAL UNI L MOD SED  01/06/2020  . IR ANGIOGRAM FOLLOW UP STUDY  01/06/2020  . IR ANGIOGRAM FOLLOW UP STUDY  01/06/2020  . IR ANGIOGRAM FOLLOW UP STUDY  01/06/2020  . IR ANGIOGRAM FOLLOW UP STUDY  01/06/2020  . IR ANGIOGRAM FOLLOW UP STUDY  01/06/2020  . IR ANGIOGRAM FOLLOW UP STUDY  01/06/2020  . IR ANGIOGRAM FOLLOW UP STUDY  01/06/2020  . IR ANGIOGRAM FOLLOW UP STUDY  01/06/2020  . IR ANGIOGRAM FOLLOW UP STUDY  01/06/2020  . IR ANGIOGRAM FOLLOW UP STUDY  01/06/2020  .  IR NEURO EACH ADD'L AFTER BASIC UNI RIGHT (MS)  01/06/2020  . IR TRANSCATH/EMBOLIZ  01/06/2020  . RADIOLOGY WITH ANESTHESIA N/A 01/06/2020   Procedure: IR WITH ANESTHESIA FOR ANEURYSM;  Surgeon: Consuella Lose, MD;  Location: [facility];  Service: Radiology;  Laterality: N/A;  . TUBAL LIGATION      There were no vitals filed for this visit.   Subjective Assessment - 03/11/20 1453    Subjective Pt reports no changes. She came in with walker but not using much at home.    Patient is accompained by: Family member   [name]   Limitations Walking;Standing;House hold activities    How long can you walk comfortably? Did a little walking yesterday up/down the hallway    Patient Stated Goals Use my right hand like I used to.    Currently in Pain? No/denies    Pain Score 0-No pain   2-2/29 pain with certain movements.   Pain Location Arm    Pain Orientation Right    Pain Descriptors / Indicators Aching    Pain Onset 1 to 4 weeks ago    Pain Frequency Intermittent                             [facility] Adult PT  Treatment/Exercise - 03/11/20 1455      Transfers   Transfers Sit to Stand;Stand to Sit    Sit to Stand 5: Supervision    Stand to Sit 5: Supervision      Ambulation/Gait   Ambulation/Gait Yes    Ambulation/Gait Assistance 5: Supervision;4: Min guard    Ambulation/Gait Assistance Details Pt was given verbal cues to relax right arm with gait. Reports it felt a little heavy but not too painful. Pt does fatigue with longer gait.     Ambulation Distance (Feet) 460 Feet    Assistive device None    Gait Pattern Step-through pattern    Ambulation Surface Level;Indoor      Neuro Re-ed    Neuro Re-ed Details  Obstacle course x 6 bouts stepping over 2 foam bolsters on 4" side with reciprocal steps, side stepping over mat, weaving in and out of 5 cones and reciprocal steps over 4 hurdles CGA. Step-ups on 2x4" foam beam x 5 each foot with min assist from PT to steady before  stepping back down. Standing on airex x 30 sec eyes open then x 30 sec eyes closed with increased sway. Pt reports having trouble keeping eyes closed. Standing on airex with 1 UE HHA tapping cone x 10 each leg. Standing on airex with 1 HHA tapping cones x 10 each leg. Tall kneeling on mat with arms supported on blue physioball: pt at first reported pain in right shoulder so PT had to IR some to get in more comfortable postion with positioning hand on more pronation. Pt then able to tolerate. Had pt gently rock ball forward and back with left arm with PT supporting right arm x 10 through minimal range. Pt denied any pain. In same position, partial squats in tall kneeling x 10. Sit to stand from edge of mat x 5 with mirror in front with verbal cues to not push with left arm to decrease lean to left and twisting. Pt provided support to right arm to keep in more extended position with slight elbow flexion and forearm pronation during sit to stand. Husband observing throughout so he could attempt at home. Advised to be sure he is not pulling on arm but supporting to maintain better positioning.                  PT Education - 03/11/20 1617    Education Details Discussed added sit to stand with trying to have husband aide in right arm positioning.    Person(s) Educated Patient;Spouse    Methods Explanation    Comprehension Verbalized understanding            PT Short Term Goals - 03/01/20 1301      PT SHORT TERM GOAL #1   Title Pt/spouse will be independent with initial HEP in order to indicate decreased fall risk and improved functional mobility.  (Target Date: 03/26/20)    Time 4    Period Weeks    Status New    Target Date 03/26/20      PT SHORT TERM GOAL #2   Title Pt will complete BERG balance test and improve score by 6 points from baseline in order to indicate decreased fall risk.    Baseline 46/56 on 03/01/20    Time 4    Period Weeks    Status New      PT SHORT TERM GOAL #3     Title Pt will improve 5TSS to </=25 secs with single UE support  in order to indicate dec fall risk and improved functional strength.    Time 4    Period Weeks    Status New      PT SHORT TERM GOAL #4   Title Pt will improve gait speed to >/=2.4 ft/sec with LRAD in order to indicate dec fall risk.    Time 4    Period Weeks    Status New      PT SHORT TERM GOAL #5   Title Pt will ambulate x 90' at S level with LRAD in simulated household environment in order to indicate improved independence in her home.    Time 4    Period Weeks    Status New             PT Long Term Goals - 03/01/20 1302      PT LONG TERM GOAL #1   Title Pt/spouse will be independent with final HEP in order to indicate dec fall risk and improved functional mobility.  (Target Date: 04/25/20)    Time 8    Period Weeks    Status New      PT LONG TERM GOAL #2   Title Pt will improve BERG balance test score by 10 points from baseline in order to indicate decreased fall risk.    Baseline 03/01/20 46/56    Time 8    Period Weeks    Status New      PT LONG TERM GOAL #3   Title Pt will perform 5TSS in </=20 secs without UE support in order to indicate improved functional strength    Time 8    Period Weeks    Status New      PT LONG TERM GOAL #4   Title Pt will ambulate x 500' w/ LRAD at S level over unlevel paved and grassy surfaces, including ramp/curb in order to indicate safe community mobility.    Time 8    Period Weeks    Status New      PT LONG TERM GOAL #5   Title Pt will negotiate up/down 4 stairs with single L rail at mod I level in order to indicate safe home entry/exit.    Time 8    Period Weeks    Status New                 Plan - 03/11/20 1618    Clinical Impression Statement Pt continues to tolerate more gait activities with better job relaxing right arm at side with less pain. Pt was challenged with higher level balance activities on compliant surface needing CGA/min assist at  times. PT did approve pt to walk with husband assist without walker at this time. She has a lot of trouble controlling walker with veering to right due to RUE weakness which increases pain.    Personal Factors and Comorbidities Age;Comorbidity 1;Profession    Comorbidities B ICHs    Examination-Activity Limitations Bend;Carry;Lift;Locomotion Level;Reach Overhead;Squat;Stairs;Stand;Transfers    Examination-Participation Restrictions Cleaning;Community Activity;Driving;Occupation;Yard Work;Laundry;Meal Prep    Stability/Clinical Decision Making Evolving/Moderate complexity    Rehab Potential Good    PT Frequency 3x / week   then 2x/wk for 4 weeks   PT Duration 4 weeks   then 2x/wk for 4 weeks   PT Treatment/Interventions ADLs/Self Care Home Management;Aquatic Therapy;Electrical Stimulation;DME Instruction;Gait training;Stair training;Functional mobility training;Therapeutic activities;Therapeutic exercise;Balance training;Neuromuscular re-education;Cognitive remediation;Patient/family education;Orthotic Fit/Training;Manual techniques;Passive range of motion;Dry needling;Energy conservation;Vestibular;Visual/perceptual remediation/compensation    PT Next Visit Plan Focus on trunk and shoulder  activation with gait and mobility tasks. Continue gait without AD with cues to relax RUE and balance training as well as functional strengthening.    PT Home Exercise Plan They want things to do at home.  I would love to try and do things to incorporate her RUE since it is so tight.    Consulted and Agree with Plan of Care Patient;Family member/caregiver           Patient will benefit from skilled therapeutic intervention in order to improve the following deficits and impairments:  Abnormal gait, Decreased activity tolerance, Decreased balance, Decreased cognition, Decreased endurance, Decreased knowledge of use of DME, Decreased mobility, Decreased strength, Impaired perceived functional ability, Impaired  flexibility, Impaired sensation, Postural dysfunction, Improper body mechanics, Impaired vision/preception, Impaired UE functional use, Impaired tone  Visit Diagnosis: Other abnormalities of gait and mobility  Muscle weakness (generalized)  Unsteadiness on feet     Problem List Patient Active Problem List   Diagnosis Date Noted  . GIB (gastrointestinal bleeding) 02/28/2020  . Elevated BUN   . Prediabetes   . 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[place]) 01/20/2020  . Cerebral vasospasm   . Sinus tachycardia   . Dysphagia, post-stroke   . Thrombocytopenia ([place])   . Acute blood loss anemia   . Brain aneurysm   . ICH (intracerebral hemorrhage) ([place]) 01/05/2020    Electa Sniff, PT, DPT, NCS 03/11/2020, 4:22 PM  [facility] [address] [place], [place], [address] Phone: [phone]   Fax:  [phone]  Name: Erika Stanton MRN: [id number] Date of Birth: [birth year]

## 2020-03-11 NOTE — Therapy (Signed)
[facility] [address] [place], [place], [address] Phone: [phone]   Fax:  [phone]  Speech Language Pathology Treatment  Patient Details  Name: Erika Stanton MRN: [id number] Date of Birth: 10-26-1955 Referring Provider (SLP): Alger Simons, MD   Encounter Date: 03/11/2020   End of Session - 03/11/20 1724    Visit Number 3    Number of Visits 17    Date for SLP Re-Evaluation 05/27/20   90 days   SLP Start Time 6962    SLP Stop Time  1400    SLP Time Calculation (min) 43 min    Activity Tolerance Patient tolerated treatment well           Past Medical History:  Diagnosis Date  . Melena     Past Surgical History:  Procedure Laterality Date  . CHOLECYSTECTOMY    . COLONOSCOPY  11/2017   at [facility]. no recurrent polyps.  suggest repeat surveillance study 11/2022.    Marland Kitchen COLONOSCOPY W/ POLYPECTOMY  06/2014   Dr Roxy Manns at [facility].  3 adenomatoous polyps, anal fissure.    . ESOPHAGOGASTRODUODENOSCOPY (EGD) WITH PROPOFOL N/A 01/27/2020   Procedure: ESOPHAGOGASTRODUODENOSCOPY (EGD) WITH PROPOFOL;  Surgeon: Mauri Pole, MD;  Location: [facility];  Service: Endoscopy;  Laterality: N/A;  . IR 3D INDEPENDENT WKST  01/06/2020  . IR ANGIO INTRA EXTRACRAN SEL INTERNAL CAROTID BILAT MOD SED  01/06/2020  . IR ANGIO VERTEBRAL SEL VERTEBRAL UNI L MOD SED  01/06/2020  . IR ANGIOGRAM FOLLOW UP STUDY  01/06/2020  . IR ANGIOGRAM FOLLOW UP STUDY  01/06/2020  . IR ANGIOGRAM FOLLOW UP STUDY  01/06/2020  . IR ANGIOGRAM FOLLOW UP STUDY  01/06/2020  . IR ANGIOGRAM FOLLOW UP STUDY  01/06/2020  . IR ANGIOGRAM FOLLOW UP STUDY  01/06/2020  . IR ANGIOGRAM FOLLOW UP STUDY  01/06/2020  . IR ANGIOGRAM FOLLOW UP STUDY  01/06/2020  . IR ANGIOGRAM FOLLOW UP STUDY  01/06/2020  . IR ANGIOGRAM FOLLOW UP STUDY  01/06/2020  . IR NEURO EACH ADD'L AFTER BASIC UNI RIGHT (MS)  01/06/2020  . IR TRANSCATH/EMBOLIZ  01/06/2020  . RADIOLOGY WITH ANESTHESIA  N/A 01/06/2020   Procedure: IR WITH ANESTHESIA FOR ANEURYSM;  Surgeon: Consuella Lose, MD;  Location: [facility];  Service: Radiology;  Laterality: N/A;  . TUBAL LIGATION      There were no vitals filed for this visit.   Subjective Assessment - 03/11/20 1320    Subjective Pt enters with husband remembering that she didn't do trailmaking correctly.    Currently in Pain? No/denies                 ADULT SLP TREATMENT - 03/11/20 1327      General Information   Behavior/Cognition Alert;Cooperative;Pleasant mood;Impulsive      Treatment Provided   Treatment provided Dysphagia      Cognitive-Linquistic Treatment   Treatment focused on Cognition    Skilled Treatment SLP completed CLQT with pt today - pt scores were Attention=168 (mild defict), Memory= 174 (WNL), Executive Function= 20 (mild-mod defict), Language= 31 (WNL), Visuospatial skills= 79 (mild deficit), clock drawing=9 (mod defiict). OVerall pt scored as a mild cognitive linguistic deficit however SLP believes pt's score reflects more like mild-mod cognitive linguistic deficits with reduced awareness of deficits.. Today, SLP showed pt 3 instances when she finished something too quickly indicative of impulsivity. Pt did not agree that this was negatively affecting her until SLP asked her if husband tells her  to slow down and take her time. Pt answered affirmatively and then agreed with SLP this may be affecting her negaively. SLP assisted pt in understanding that attenoitn and impulsivity are related and pt likely having more difficulty paying atteniton - that was likely why design generation, symbol cancellation, and symbol trails were challenging for pt.       Assessment / Recommendations / Plan   Plan Goals updated      Progression Toward Goals   Progression toward goals Progressing toward goals            SLP Education - 03/11/20 1723    Education Details attention and impulsivity are related, they are affecting pt's  ability to do tihngs she was doing premorbidly    Person(s) Educated Patient    Methods Explanation    Comprehension Verbalized understanding;Need further instruction            SLP Short Term Goals - 03/11/20 1726      SLP SHORT TERM GOAL #1   Title pt to complete cognitive linguistic testing    Period --   or 4 total sessions   Status Achieved      SLP SHORT TERM GOAL #2   Title pt will demo emergent awareness 100% with min complex therapy tasks x3 visits    Time 4    Period Weeks    Status On-going      SLP SHORT TERM GOAL #3   Title pt will demo appriopriate aspiration precuations with suggested POs x3 sessions    Time 4    Period Weeks    Status On-going      SLP SHORT TERM GOAL #4   Title pt will solve household probelms using functional solutions x3 sessions    Time 4    Period Weeks    Status On-going      SLP SHORT TERM GOAL #5   Title pt will demo selective attention (internal distraction) to a simple functional task for 10 minutes with rare min A back to task in 3 sessions    Time 4    Period Weeks    Status New            SLP Long Term Goals - 03/11/20 1727      SLP LONG TERM GOAL #1   Title pt will demo anticipatory awareness by double checking all work x5 sessions    Time 8    Period Weeks   or 17 total sessions, for all LTGs   Status On-going      SLP LONG TERM GOAL #2   Title pt will follow swallow precautions with POs with modified independence x3 sessions    Time 8    Period Weeks    Status On-going      SLP LONG TERM GOAL #3   Title pt will demo [facility] skills in solving mod complex problems in WNL amount of time with modified independence (double checking answers , etc)    Time 8    Period Weeks    Status On-going      SLP LONG TERM GOAL #4   Title pt will demo altaernating attention in simple functional cognitive linguistic tasks in 3 sessions    Time [place] - 03/11/20 1724    Clinical Impression  Statement Barri completed the Cognitive Linguistic Quick Test today and results in "skilled intervention". Goals were  updated in light of these results. 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 She is leaning towards early retirement.    Speech Therapy Frequency 2x / week    Duration 8 weeks   or 17 total sessions   Treatment/Interventions Aspiration precaution training;Pharyngeal strengthening exercises;Compensatory techniques;Diet toleration management by SLP;Trials of upgraded texture/liquids;Cueing hierarchy;Cognitive reorganization;Internal/external aids;Patient/family education;SLP instruction and feedback;Functional tasks    Potential to Achieve Goals Good    Consulted and Agree with Plan of Care Patient           Patient will benefit from skilled therapeutic intervention in order to improve the following deficits and impairments:   Cognitive communication deficit  Dysphagia, oropharyngeal phase    Problem List Patient Active Problem List   Diagnosis Date Noted  . GIB (gastrointestinal bleeding) 02/28/2020  . Elevated BUN   . Prediabetes   . 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[place]) 01/20/2020  . Cerebral vasospasm   . Sinus tachycardia   . Dysphagia, post-stroke   . Thrombocytopenia ([place])   . Acute blood loss anemia   . Brain aneurysm   . ICH (intracerebral hemorrhage) ([place]) 01/05/2020    [facility] ,[place], [place]  03/11/2020, 5:29 PM  [facility] [address] [place], [place], [address] Phone: [phone]   Fax:  [phone]   Name: NEYDA DURANGO MRN: [id number] Date of Birth:  07/30/1955

## 2020-03-11 NOTE — Patient Instructions (Signed)
   Talk path therapy Username: [email or web address] PW: therapy  Do:  Picture Recall  [facility] [place]  Articles

## 2020-03-14 ENCOUNTER — Encounter: Payer: Self-pay | Admitting: Physical Therapy

## 2020-03-14 ENCOUNTER — Ambulatory Visit: Payer: BC Managed Care – PPO | Attending: Physical Medicine & Rehabilitation | Admitting: Physical Therapy

## 2020-03-14 ENCOUNTER — Ambulatory Visit: Payer: BC Managed Care – PPO | Admitting: Occupational Therapy

## 2020-03-14 ENCOUNTER — Other Ambulatory Visit: Payer: Self-pay

## 2020-03-14 DIAGNOSIS — R2689 Other abnormalities of gait and mobility: Secondary | ICD-10-CM | POA: Insufficient documentation

## 2020-03-14 DIAGNOSIS — R41840 Attention and concentration deficit: Secondary | ICD-10-CM | POA: Diagnosis present

## 2020-03-14 DIAGNOSIS — R2681 Unsteadiness on feet: Secondary | ICD-10-CM | POA: Diagnosis present

## 2020-03-14 DIAGNOSIS — R1312 Dysphagia, oropharyngeal phase: Secondary | ICD-10-CM | POA: Insufficient documentation

## 2020-03-14 DIAGNOSIS — M79641 Pain in right hand: Secondary | ICD-10-CM | POA: Insufficient documentation

## 2020-03-14 DIAGNOSIS — R41841 Cognitive communication deficit: Secondary | ICD-10-CM | POA: Diagnosis present

## 2020-03-14 DIAGNOSIS — I69159 Hemiplegia and hemiparesis following nontraumatic intracerebral hemorrhage affecting unspecified side: Secondary | ICD-10-CM

## 2020-03-14 DIAGNOSIS — M6281 Muscle weakness (generalized): Secondary | ICD-10-CM | POA: Diagnosis present

## 2020-03-14 DIAGNOSIS — R278 Other lack of coordination: Secondary | ICD-10-CM | POA: Insufficient documentation

## 2020-03-14 DIAGNOSIS — M25511 Pain in right shoulder: Secondary | ICD-10-CM | POA: Insufficient documentation

## 2020-03-14 DIAGNOSIS — R41844 Frontal lobe and executive function deficit: Secondary | ICD-10-CM | POA: Diagnosis present

## 2020-03-14 NOTE — Therapy (Signed)
[facility] [address] [place], [place], [address] Phone: [phone]   Fax:  [phone]  Occupational Therapy Treatment  Patient Details  Name: Erika Stanton MRN: [id number] Date of Birth: 09/21/1955 Referring Provider (OT): Dr. Naaman Plummer   Encounter Date: 03/14/2020   OT End of Session - 03/14/20 1213    Visit Number 3    Number of Visits 25    Date for OT Re-Evaluation 05/25/20    Authorization Time Period State BCBS    OT Start Time 1100    OT Stop Time 1145    OT Time Calculation (min) 45 min    Activity Tolerance Patient tolerated treatment well    Behavior During Therapy [facility] for tasks assessed/performed           Past Medical History:  Diagnosis Date  . Melena     Past Surgical History:  Procedure Laterality Date  . CHOLECYSTECTOMY    . COLONOSCOPY  11/2017   at [facility]. no recurrent polyps.  suggest repeat surveillance study 11/2022.    Marland Kitchen COLONOSCOPY W/ POLYPECTOMY  06/2014   Dr Roxy Manns at [facility].  3 adenomatoous polyps, anal fissure.    . ESOPHAGOGASTRODUODENOSCOPY (EGD) WITH PROPOFOL N/A 01/27/2020   Procedure: ESOPHAGOGASTRODUODENOSCOPY (EGD) WITH PROPOFOL;  Surgeon: Mauri Pole, MD;  Location: [facility];  Service: Endoscopy;  Laterality: N/A;  . IR 3D INDEPENDENT WKST  01/06/2020  . IR ANGIO INTRA EXTRACRAN SEL INTERNAL CAROTID BILAT MOD SED  01/06/2020  . IR ANGIO VERTEBRAL SEL VERTEBRAL UNI L MOD SED  01/06/2020  . IR ANGIOGRAM FOLLOW UP STUDY  01/06/2020  . IR ANGIOGRAM FOLLOW UP STUDY  01/06/2020  . IR ANGIOGRAM FOLLOW UP STUDY  01/06/2020  . IR ANGIOGRAM FOLLOW UP STUDY  01/06/2020  . IR ANGIOGRAM FOLLOW UP STUDY  01/06/2020  . IR ANGIOGRAM FOLLOW UP STUDY  01/06/2020  . IR ANGIOGRAM FOLLOW UP STUDY  01/06/2020  . IR ANGIOGRAM FOLLOW UP STUDY  01/06/2020  . IR ANGIOGRAM FOLLOW UP STUDY  01/06/2020  . IR ANGIOGRAM FOLLOW UP STUDY  01/06/2020  . IR NEURO EACH ADD'L AFTER BASIC UNI RIGHT (MS)   01/06/2020  . IR TRANSCATH/EMBOLIZ  01/06/2020  . RADIOLOGY WITH ANESTHESIA N/A 01/06/2020   Procedure: IR WITH ANESTHESIA FOR ANEURYSM;  Surgeon: Consuella Lose, MD;  Location: [facility];  Service: Radiology;  Laterality: N/A;  . TUBAL LIGATION      There were no vitals filed for this visit.   Subjective Assessment - 03/14/20 1105    Pertinent History ICH 01/06/20, Aneurysm. PMH: OA bilateral knees and hands    Currently in Pain? Yes    Pain Score 4     Pain Location Shoulder    Pain Orientation Right    Pain Onset 1 to 4 weeks ago            Discussed medical management of Rt shoulder pain to decrease intensity and prevent guarding and resistance to treatments - pt sees physiatrist this week.  Pt issued HEP for low range shoulder ROM and wt bearing over RUE for body on arm movements - see pt instructions for details. Pt needed frequent encouragement but did better when she was allowed to guide movement herself. Husband voiced concern about not getting enough therapy and wants to make sure she is getting as much as she can.  Briefly discussed possible aquatic therapy however told them we would need to make sure insurance covered and schedules available  before considering.                     OT Education - 03/14/20 1213    Education Details Additional HEP for shoulder/pain management    Person(s) Educated Patient;Spouse    Methods Explanation;Demonstration;Verbal cues;Handout    Comprehension Verbalized understanding;Returned demonstration;Verbal cues required            OT Short Term Goals - 03/02/20 1521      OT SHORT TERM GOAL #1   Title I with initialHEP.    Time 6    Period Weeks    Status New    Target Date 04/13/20      OT SHORT TERM GOAL #2   Title Pt will perform bathing with min A.    Time 6    Period Weeks    Status New      OT SHORT TERM GOAL #3   Title Pt will donn shirt and pants with min A    Time 6    Period Weeks    Status New       OT SHORT TERM GOAL #4   Title Pt will demonstrate 30 A/ROM shoulder flexion for RUE with pain less than or equal to 4/10.    Time 6    Period Weeks    Status New      OT SHORT TERM GOAL #5   Title Pt will  demonstrate improved LUE fine motor coordination for ADLs as evidenced by decreasing 9 hole peg test score to 45 secs or less.    Time 6    Period Weeks    Status New      OT SHORT TERM GOAL #6   Title Pt/ caregiver will verbalize understanding of RUE positioning and edema control techniques to minimize pain and risk for injury    Time 6    Period Weeks    Status New      OT SHORT TERM GOAL #7   Title Pt will demonstrate at least 25% finger flexion/ extension in RUE in prep for functional use.    Time 6    Period Weeks    Status New             OT Long Term Goals - 03/02/20 1527      OT LONG TERM GOAL #1   Title Pt will perfrom  all basic ADLS with supervision    Time 12    Period Weeks    Status New      OT LONG TERM GOAL #2   Title Pt will demonstrate 45* RUE shoulder flexion in prep for functional reach with pain less than or equal to 3/10.    Time 12    Period Weeks    Status New      OT LONG TERM GOAL #3   Title Pt will use RUE as a stabilizer/ gross A at least 30 % of the time for ADLs/ IADLs.    Time 12    Period Weeks    Status New      OT LONG TERM GOAL #4   Title Pt will increase LUE grip strength to 25 lbs or greater for increased ease with ADLs.    Time 12    Period Weeks    Status New      OT LONG TERM GOAL #5   Title Pt will perform simple home management/ snack prep with min A.    Time 12  Period Weeks    Status New      Long Term Additional Goals   Additional Long Term Goals Yes      OT LONG TERM GOAL #6   Title Pt will demonstrate ability to grasp/ release a cup 2/3 trials with RUE.    Time 12    Period Weeks    Status New      OT LONG TERM GOAL #7   Title Pt will safely navigate a busy environment and locate items with  supervision and  90% or greater accuracy without bumping into items.    Time 12    Period Weeks    Status New      OT LONG TERM GOAL #8   Title I with updated HEP.    Time 12    Period Weeks    Status New                 Plan - 03/14/20 1214    Clinical Impression Statement Pt is limited by pain and fear of RUE pain. Pt reluctant to allow movement and very guarded. Humeral head is anteriorly placed and scapula elevated.    OT Occupational Profile and History Detailed Assessment- Review of Records and additional review of physical, cognitive, psychosocial history related to current functional performance    Occupational performance deficits (Please refer to evaluation for details): ADL's;IADL's;Rest and Sleep;Work;Play;Leisure    Body Structure / Function / Physical Skills ADL;Balance;Coordination;Decreased knowledge of precautions;Decreased knowledge of use of DME;Dexterity;Edema;Mobility;Tone;Strength;IADL;Sensation;GMC;Gait;ROM;FMC;Flexibility;Pain;Vision;UE functional use;Endurance    Cognitive Skills Attention;Perception;Problem Solve;Safety Awareness;Thought    Rehab Potential Good    Clinical Decision Making Several treatment options, min-mod task modification necessary    Comorbidities Affecting Occupational Performance: None    Modification or Assistance to Complete Evaluation  Min-Moderate modification of tasks or assist with assess necessary to complete eval    OT Frequency 2x / week    OT Duration 12 weeks    OT Treatment/Interventions Self-care/ADL training;Ultrasound;Energy conservation;Visual/perceptual remediation/compensation;Patient/family education;DME and/or AE instruction;Aquatic Therapy;Paraffin;Gait Training;Passive range of motion;Balance training;Fluidtherapy;Electrical Stimulation;Functional Mobility Training;Splinting;Moist Heat;Therapeutic exercise;Manual Therapy;Cognitive remediation/compensation;Manual lymph drainage;Neuromuscular education;Coping  strategies training    Plan review HEP, review sidelying exercise, consider aquatic therapy    Consulted and Agree with Plan of Care Patient;Family member/caregiver           Patient will benefit from skilled therapeutic intervention in order to improve the following deficits and impairments:   Body Structure / Function / Physical Skills: ADL, Balance, Coordination, Decreased knowledge of precautions, Decreased knowledge of use of DME, Dexterity, Edema, Mobility, Tone, Strength, IADL, Sensation, GMC, Gait, ROM, FMC, Flexibility, Pain, Vision, UE functional use, Endurance Cognitive Skills: Attention, Perception, Problem Solve, Safety Awareness, Thought     Visit Diagnosis: Acute pain of right shoulder  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 GIB (gastrointestinal bleeding) 02/28/2020  . Elevated BUN   . Prediabetes   . 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[place]) 01/20/2020  . Cerebral vasospasm   . Sinus tachycardia   . Dysphagia, post-stroke   . Thrombocytopenia ([place])   . Acute blood loss anemia   . Brain aneurysm   . ICH (intracerebral hemorrhage) ([place]) 01/05/2020    Carey Bullocks, OTR/L 03/14/2020, 12:18 PM  [facility] [address] [place], [place], [address] Phone: [phone]   Fax:  [phone]  Name: SAYRE MAZOR MRN:  [id number] Date of Birth: Jun 25, 1955

## 2020-03-14 NOTE — Therapy (Signed)
[facility] [address] [place], [place], [address] Phone: [phone]   Fax:  [phone]  Physical Therapy Treatment  Patient Details  Name: Erika Stanton MRN: [id number] Date of Birth: 1955/10/28 Referring Provider (PT): Alger Simons, MD   Encounter Date: 03/14/2020   PT End of Session - 03/14/20 1238    Visit Number 7    Number of Visits 21    Date for PT Re-Evaluation 04/25/20    Authorization Type BCBS State    PT Start Time 1148    PT Stop Time 1230    PT Time Calculation (min) 42 min    Equipment Utilized During Treatment Gait belt    Activity Tolerance Patient tolerated treatment well    Behavior During Therapy [facility] for tasks assessed/performed           Past Medical History:  Diagnosis Date  . Melena     Past Surgical History:  Procedure Laterality Date  . CHOLECYSTECTOMY    . COLONOSCOPY  11/2017   at [facility]. no recurrent polyps.  suggest repeat surveillance study 11/2022.    Marland Kitchen COLONOSCOPY W/ POLYPECTOMY  06/2014   Dr Roxy Manns at [facility].  3 adenomatoous polyps, anal fissure.    . ESOPHAGOGASTRODUODENOSCOPY (EGD) WITH PROPOFOL N/A 01/27/2020   Procedure: ESOPHAGOGASTRODUODENOSCOPY (EGD) WITH PROPOFOL;  Surgeon: Mauri Pole, MD;  Location: [facility];  Service: Endoscopy;  Laterality: N/A;  . IR 3D INDEPENDENT WKST  01/06/2020  . IR ANGIO INTRA EXTRACRAN SEL INTERNAL CAROTID BILAT MOD SED  01/06/2020  . IR ANGIO VERTEBRAL SEL VERTEBRAL UNI L MOD SED  01/06/2020  . IR ANGIOGRAM FOLLOW UP STUDY  01/06/2020  . IR ANGIOGRAM FOLLOW UP STUDY  01/06/2020  . IR ANGIOGRAM FOLLOW UP STUDY  01/06/2020  . IR ANGIOGRAM FOLLOW UP STUDY  01/06/2020  . IR ANGIOGRAM FOLLOW UP STUDY  01/06/2020  . IR ANGIOGRAM FOLLOW UP STUDY  01/06/2020  . IR ANGIOGRAM FOLLOW UP STUDY  01/06/2020  . IR ANGIOGRAM FOLLOW UP STUDY  01/06/2020  . IR ANGIOGRAM FOLLOW UP STUDY  01/06/2020  . IR ANGIOGRAM FOLLOW UP STUDY  01/06/2020  . IR  NEURO EACH ADD'L AFTER BASIC UNI RIGHT (MS)  01/06/2020  . IR TRANSCATH/EMBOLIZ  01/06/2020  . RADIOLOGY WITH ANESTHESIA N/A 01/06/2020   Procedure: IR WITH ANESTHESIA FOR ANEURYSM;  Surgeon: Consuella Lose, MD;  Location: [facility];  Service: Radiology;  Laterality: N/A;  . TUBAL LIGATION      There were no vitals filed for this visit.   Subjective Assessment - 03/14/20 1154    Subjective Pt reports not using walker at home but feels like it is going good.  Her husband walks with her and helps her get up from low surfaces if needed.    Patient is accompained by: Family member   husband Ronalee Belts   Limitations Walking;Standing;House hold activities    How long can you walk comfortably? Did a little walking yesterday up/down the hallway    Patient Stated Goals Use my right hand like I used to.    Currently in Pain? Yes    Pain Score --   goes up to a 4 if "someone messes with it"   Pain Location Other (Comment)    Pain Orientation Right    Pain Descriptors / Indicators Aching    Pain Type Chronic pain    Pain Onset 1 to 4 weeks ago    Pain Frequency Intermittent    Aggravating  Factors  moving it    Pain Relieving Factors resting it                             [facility] Adult PT Treatment/Exercise - 03/14/20 0001      Bed Mobility   Bed Mobility Sit to Supine;Supine to Sit    Supine to Sit Supervision/Verbal cueing    Sit to Supine Supervision/Verbal cueing      Transfers   Transfers Sit to Stand;Stand to Sit    Sit to Stand 5: Supervision    Sit to Stand Details Verbal cues for technique    Stand to Sit 5: Supervision    Stand to Sit Details (indicate cue type and reason) Verbal cues for technique    Number of Reps 2 sets;Other reps (comment)   5 reps   Transfer Cueing cues for forward lean, nose over toes, foot placement      Ambulation/Gait   Ambulation/Gait No      Posture/Postural Control   Posture/Postural Control Postural limitations    Postural Limitations  Rounded Shoulders;Forward head;Posterior pelvic tilt;Weight shift left      Neuro Re-ed    Neuro Re-ed Details  Seated on blue therapy ball working on static sitting, bouncing, LAQ, marching, rocking forward, rocking backward, pelvic elevation.  Pt needed min assist to maintain balance on ball.       Exercises   Exercises Knee/Hip;Lumbar      Lumbar Exercises: Seated   Other Seated Lumbar Exercises pelvic tilts with PTA assisting with sheet x 20 reps;lateral weight shift with pelvic elevation x 10 reps each side with max verbal and tactile cues to perform correctly      Lumbar Exercises: Supine   Pelvic Tilt 15 reps;5 seconds;Limitations    Pelvic Tilt Limitations max cues to perform correctly/initiate    Clam 10 reps   2 sets-1 with red band 1 without   Bridge with clamshell Non-compliant;10 reps;1 second      Knee/Hip Exercises: Supine   Straight Leg Raises Strengthening;Both;10 reps;1 set                    PT Short Term Goals - 03/01/20 1301      PT SHORT TERM GOAL #1   Title Pt/spouse will be independent with initial HEP in order to indicate decreased fall risk and improved functional mobility.  (Target Date: 03/26/20)    Time 4    Period Weeks    Status New    Target Date 03/26/20      PT SHORT TERM GOAL #2   Title Pt will complete BERG balance test and improve score by 6 points from baseline in order to indicate decreased fall risk.    Baseline 46/56 on 03/01/20    Time 4    Period Weeks    Status New      PT SHORT TERM GOAL #3   Title Pt will improve 5TSS to </=25 secs with single UE support in order to indicate dec fall risk and improved functional strength.    Time 4    Period Weeks    Status New      PT SHORT TERM GOAL #4   Title Pt will improve gait speed to >/=2.4 ft/sec with LRAD in order to indicate dec fall risk.    Time 4    Period Weeks    Status New      PT SHORT  TERM GOAL #5   Title Pt will ambulate x 101' at S level with LRAD in  simulated household environment in order to indicate improved independence in her home.    Time 4    Period Weeks    Status New             PT Long Term Goals - 03/01/20 1302      PT LONG TERM GOAL #1   Title Pt/spouse will be independent with final HEP in order to indicate dec fall risk and improved functional mobility.  (Target Date: 04/25/20)    Time 8    Period Weeks    Status New      PT LONG TERM GOAL #2   Title Pt will improve BERG balance test score by 10 points from baseline in order to indicate decreased fall risk.    Baseline 03/01/20 46/56    Time 8    Period Weeks    Status New      PT LONG TERM GOAL #3   Title Pt will perform 5TSS in </=20 secs without UE support in order to indicate improved functional strength    Time 8    Period Weeks    Status New      PT LONG TERM GOAL #4   Title Pt will ambulate x 500' w/ LRAD at S level over unlevel paved and grassy surfaces, including ramp/curb in order to indicate safe community mobility.    Time 8    Period Weeks    Status New      PT LONG TERM GOAL #5   Title Pt will negotiate up/down 4 stairs with single L rail at mod I level in order to indicate safe home entry/exit.    Time 8    Period Weeks    Status New                 Plan - 03/14/20 1238    Clinical Impression Statement Pt continues to have motor planning difficulties especially with trunk and pelvic initiation. Session focused on muscle recruitment, strengthening and sitting balance on therapy ball.  Cont per poc.    Personal Factors and Comorbidities Age;Comorbidity 1;Profession    Comorbidities B ICHs    Examination-Activity Limitations Bend;Carry;Lift;Locomotion Level;Reach Overhead;Squat;Stairs;Stand;Transfers    Examination-Participation Restrictions Cleaning;Community Activity;Driving;Occupation;Yard Work;Laundry;Meal Prep    Stability/Clinical Decision Making Evolving/Moderate complexity    Rehab Potential Good    PT Frequency 3x /  week   then 2x/wk for 4 weeks   PT Duration 4 weeks   then 2x/wk for 4 weeks   PT Treatment/Interventions ADLs/Self Care Home Management;Aquatic Therapy;Electrical Stimulation;DME Instruction;Gait training;Stair training;Functional mobility training;Therapeutic activities;Therapeutic exercise;Balance training;Neuromuscular re-education;Cognitive remediation;Patient/family education;Orthotic Fit/Training;Manual techniques;Passive range of motion;Dry needling;Energy conservation;Vestibular;Visual/perceptual remediation/compensation    PT Next Visit Plan Focus on trunk and shoulder activation with gait and mobility tasks. Continue gait without AD with cues to relax RUE and balance training as well as functional strengthening.    PT Home Exercise Plan They want things to do at home.  I would love to try and do things to incorporate her RUE since it is so tight.    Consulted and Agree with Plan of Care Patient;Family member/caregiver           Patient will benefit from skilled therapeutic intervention in order to improve the following deficits and impairments:  Abnormal gait, Decreased activity tolerance, Decreased balance, Decreased cognition, Decreased endurance, Decreased knowledge of use of DME, Decreased mobility, Decreased strength,  Impaired perceived functional ability, Impaired flexibility, Impaired sensation, Postural dysfunction, Improper body mechanics, Impaired vision/preception, Impaired UE functional use, Impaired tone  Visit Diagnosis: Other abnormalities of gait and mobility  Muscle weakness (generalized)  Unsteadiness on feet  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 GIB (gastrointestinal bleeding) 02/28/2020  . Elevated BUN   . Prediabetes   . 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[place]) 01/20/2020  . Cerebral vasospasm   . Sinus tachycardia   . Dysphagia,  post-stroke   . Thrombocytopenia ([place])   . Acute blood loss anemia   . Brain aneurysm   . ICH (intracerebral hemorrhage) ([place]) 01/05/2020    Narda Bonds, PTA [place] 03/14/20 12:48 PM Phone: [phone] Fax: [facility] [address] [place], [place], [address] Phone: [phone]   Fax:  [phone]  Name: Erika Stanton MRN: [id number] Date of Birth: 03-07-1956

## 2020-03-14 NOTE — Patient Instructions (Signed)
Do below exercises x 10 reps, 2-3 times per day  1) Hold Rt wrist with Lt hand and slide arms down towards floor gently stretching elbow and shoulder (hands inside of knees)   2) Hold pool noodle with both hands and slide down towards floor (hands outside of knees)  3) Weight bearing over Rt hand (let fingers curl over edge of sofa) and reach across body with Lt hand to grab cups and then stretch to place cups on Lt side.   4) Weight bearing over Rt hand, reach up high to Lt side to grab cup/card, etc pushing through Rt hand (Rt side should shorten/curve, Lt side of trunk should lengthen)

## 2020-03-16 ENCOUNTER — Ambulatory Visit: Payer: BC Managed Care – PPO

## 2020-03-16 ENCOUNTER — Ambulatory Visit: Payer: BC Managed Care – PPO | Admitting: Occupational Therapy

## 2020-03-16 ENCOUNTER — Other Ambulatory Visit: Payer: Self-pay

## 2020-03-16 ENCOUNTER — Encounter: Payer: Self-pay | Admitting: Occupational Therapy

## 2020-03-16 DIAGNOSIS — R2689 Other abnormalities of gait and mobility: Secondary | ICD-10-CM

## 2020-03-16 DIAGNOSIS — M25511 Pain in right shoulder: Secondary | ICD-10-CM

## 2020-03-16 DIAGNOSIS — R2681 Unsteadiness on feet: Secondary | ICD-10-CM

## 2020-03-16 DIAGNOSIS — M6281 Muscle weakness (generalized): Secondary | ICD-10-CM

## 2020-03-16 DIAGNOSIS — R41841 Cognitive communication deficit: Secondary | ICD-10-CM

## 2020-03-16 DIAGNOSIS — I69159 Hemiplegia and hemiparesis following nontraumatic intracerebral hemorrhage affecting unspecified side: Secondary | ICD-10-CM

## 2020-03-16 DIAGNOSIS — R1312 Dysphagia, oropharyngeal phase: Secondary | ICD-10-CM

## 2020-03-16 DIAGNOSIS — M79641 Pain in right hand: Secondary | ICD-10-CM

## 2020-03-16 NOTE — Therapy (Signed)
[name] [address] [place], [place], [address] Phone: [phone]   Fax:  [phone]  Physical Therapy Treatment  Patient Details  Name: Erika Stanton MRN: [id number] Date of Birth: [birth year] Referring Provider (PT): Alger Simons, MD   Encounter Date: 03/16/2020   PT End of Session - 03/16/20 1021    Visit Number 8    Number of Visits 21    Date for PT Re-Evaluation 04/25/20    Authorization Type BCBS State    PT Start Time 1020    PT Stop Time 1058    PT Time Calculation (min) 38 min    Equipment Utilized During Treatment Gait belt    Activity Tolerance Patient tolerated treatment well    Behavior During Therapy WFL for tasks assessed/performed           Past Medical History:  Diagnosis Date  . Melena     Past Surgical History:  Procedure Laterality Date  . CHOLECYSTECTOMY    . COLONOSCOPY  11/2017   at [facility]. no recurrent polyps.  suggest repeat surveillance study 11/2022.    Marland Kitchen COLONOSCOPY W/ POLYPECTOMY  06/2014   Dr Roxy Manns at [facility].  3 adenomatoous polyps, anal fissure.    . ESOPHAGOGASTRODUODENOSCOPY (EGD) WITH PROPOFOL N/A 01/27/2020   Procedure: ESOPHAGOGASTRODUODENOSCOPY (EGD) WITH PROPOFOL;  Surgeon: Erika Pole, MD;  Location: [facility];  Service: Endoscopy;  Laterality: N/A;  . IR 3D INDEPENDENT WKST  01/06/2020  . IR ANGIO INTRA EXTRACRAN SEL INTERNAL CAROTID BILAT MOD SED  01/06/2020  . IR ANGIO VERTEBRAL SEL VERTEBRAL UNI L MOD SED  01/06/2020  . IR ANGIOGRAM FOLLOW UP STUDY  01/06/2020  . IR ANGIOGRAM FOLLOW UP STUDY  01/06/2020  . IR ANGIOGRAM FOLLOW UP STUDY  01/06/2020  . IR ANGIOGRAM FOLLOW UP STUDY  01/06/2020  . IR ANGIOGRAM FOLLOW UP STUDY  01/06/2020  . IR ANGIOGRAM FOLLOW UP STUDY  01/06/2020  . IR ANGIOGRAM FOLLOW UP STUDY  01/06/2020  . IR ANGIOGRAM FOLLOW UP STUDY  01/06/2020  . IR ANGIOGRAM FOLLOW UP STUDY  01/06/2020  . IR ANGIOGRAM FOLLOW UP STUDY  01/06/2020  . IR  NEURO EACH ADD'L AFTER BASIC UNI RIGHT (MS)  01/06/2020  . IR TRANSCATH/EMBOLIZ  01/06/2020  . RADIOLOGY WITH ANESTHESIA N/A 01/06/2020   Procedure: IR WITH ANESTHESIA FOR ANEURYSM;  Surgeon: Consuella Lose, MD;  Location: [facility];  Service: Radiology;  Laterality: N/A;  . TUBAL LIGATION      There were no vitals filed for this visit.   Subjective Assessment - 03/16/20 1021    Subjective Pt reports she is doing ok. She has been going without walker with husband supervising.    Patient is accompained by: Family member   [name]   Limitations Walking;Standing;House hold activities    How long can you walk comfortably? Did a little walking yesterday up/down the hallway    Patient Stated Goals Use my right hand like I used to.    Currently in Pain? Yes    Pain Score 2     Pain Location Arm    Pain Orientation Right    Pain Descriptors / Indicators Aching    Pain Type Chronic pain    Pain Onset 1 to 4 weeks ago    Pain Frequency Intermittent                             [facility] Adult  PT Treatment/Exercise - 03/16/20 1023      Transfers   Transfers Sit to Stand;Stand to Sit    Sit to Stand 5: Supervision    Stand to Sit 5: Supervision      Ambulation/Gait   Ambulation/Gait Yes    Ambulation/Gait Assistance 5: Supervision    Ambulation/Gait Assistance Details Pt was cued to try to relax arm at side with gait. Pt able to get more right elbow flexion and pronation at hand.    Ambulation Distance (Feet) 575 Feet    Assistive device None    Gait Pattern Step-through pattern;Decreased arm swing - right    Ambulation Surface Level;Indoor      Neuro Re-ed    Neuro Re-ed Details  Seated on blue physioball: left shoulder flexion x 10 with verbal cues to sit up tall, ant/pelvic tilts x 10 with tactile cues for form, lateral weight shifting x 10, sit to stand x 5 without hands CGA, marching x 10 with decreased stability when raising left leg. CGA for safety. Verbal cues to  keep tummy tight throughout. Step-ups on soft blue foam beam x 5 each foot getting balance each time CGA with getting balance for 5 sec each time. Standing on airex with tapping cone x 10 each foot CGA. On airex with feet together eyes open x 30 sec then eyes closed x 30 sec with increased sway CGA. In // bars on rockerboard positioned lateral: trying to maintain level x 30 sec, then adding in left shoulder flexion x 10 with cues to keep tummy tight, trunk rotation x 5 each side.                   PT Education - 03/16/20 1105    Education Details Pt to continue with current HEP    Person(s) Educated Patient;Spouse    Methods Explanation    Comprehension Verbalized understanding            PT Short Term Goals - 03/01/20 1301      PT SHORT TERM GOAL #1   Title Pt/spouse will be independent with initial HEP in order to indicate decreased fall risk and improved functional mobility.  (Target Date: 03/26/20)    Time 4    Period Weeks    Status New    Target Date 03/26/20      PT SHORT TERM GOAL #2   Title Pt will complete BERG balance test and improve score by 6 points from baseline in order to indicate decreased fall risk.    Baseline 46/56 on 03/01/20    Time 4    Period Weeks    Status New      PT SHORT TERM GOAL #3   Title Pt will improve 5TSS to </=25 secs with single UE support in order to indicate dec fall risk and improved functional strength.    Time 4    Period Weeks    Status New      PT SHORT TERM GOAL #4   Title Pt will improve gait speed to >/=2.4 ft/sec with LRAD in order to indicate dec fall risk.    Time 4    Period Weeks    Status New      PT SHORT TERM GOAL #5   Title Pt will ambulate x 44' at S level with LRAD in simulated household environment in order to indicate improved independence in her home.    Time 4    Period Weeks    Status  New             PT Long Term Goals - 03/01/20 1302      PT LONG TERM GOAL #1   Title Pt/spouse will be  independent with final HEP in order to indicate dec fall risk and improved functional mobility.  (Target Date: 04/25/20)    Time 8    Period Weeks    Status New      PT LONG TERM GOAL #2   Title Pt will improve BERG balance test score by 10 points from baseline in order to indicate decreased fall risk.    Baseline 03/01/20 46/56    Time 8    Period Weeks    Status New      PT LONG TERM GOAL #3   Title Pt will perform 5TSS in </=20 secs without UE support in order to indicate improved functional strength    Time 8    Period Weeks    Status New      PT LONG TERM GOAL #4   Title Pt will ambulate x 500' w/ LRAD at S level over unlevel paved and grassy surfaces, including ramp/curb in order to indicate safe community mobility.    Time 8    Period Weeks    Status New      PT LONG TERM GOAL #5   Title Pt will negotiate up/down 4 stairs with single L rail at mod I level in order to indicate safe home entry/exit.    Time 8    Period Weeks    Status New                 Plan - 03/16/20 1106    Clinical Impression Statement PT continued to incorporate more trunk stabilization activities in to session. Pt challenged with stability on ball needing CGA/min assist at times. Pt was able to show improving functional strength with sit to stand from ball without hands today. Better control on airex during SLS times.    Personal Factors and Comorbidities Age;Comorbidity 1;Profession    Comorbidities B ICHs    Examination-Activity Limitations Bend;Carry;Lift;Locomotion Level;Reach Overhead;Squat;Stairs;Stand;Transfers    Examination-Participation Restrictions Cleaning;Community Activity;Driving;Occupation;Yard Work;Laundry;Meal Prep    Stability/Clinical Decision Making Evolving/Moderate complexity    Rehab Potential Good    PT Frequency 3x / week   then 2x/wk for 4 weeks   PT Duration 4 weeks   then 2x/wk for 4 weeks   PT Treatment/Interventions ADLs/Self Care Home Management;Aquatic  Therapy;Electrical Stimulation;DME Instruction;Gait training;Stair training;Functional mobility training;Therapeutic activities;Therapeutic exercise;Balance training;Neuromuscular re-education;Cognitive remediation;Patient/family education;Orthotic Fit/Training;Manual techniques;Passive range of motion;Dry needling;Energy conservation;Vestibular;Visual/perceptual remediation/compensation    PT Next Visit Plan Focus on trunk and shoulder activation with gait and mobility tasks. Continue gait without AD with cues to relax RUE and balance training as well as functional strengthening.    PT Home Exercise Plan They want things to do at home.  I would love to try and do things to incorporate her RUE since it is so tight.    Consulted and Agree with Plan of Care Patient;Family member/caregiver           Patient will benefit from skilled therapeutic intervention in order to improve the following deficits and impairments:  Abnormal gait, Decreased activity tolerance, Decreased balance, Decreased cognition, Decreased endurance, Decreased knowledge of use of DME, Decreased mobility, Decreased strength, Impaired perceived functional ability, Impaired flexibility, Impaired sensation, Postural dysfunction, Improper body mechanics, Impaired vision/preception, Impaired UE functional use, Impaired tone  Visit Diagnosis: Other abnormalities  of gait and mobility  Muscle weakness (generalized)  Unsteadiness on feet     Problem List Patient Active Problem List   Diagnosis Date Noted  . GIB (gastrointestinal bleeding) 02/28/2020  . Elevated BUN   . Prediabetes   . 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[place]) 01/20/2020  . Cerebral vasospasm   . Sinus tachycardia   . Dysphagia, post-stroke   . Thrombocytopenia ([place])   . Acute blood loss anemia   . Brain aneurysm   . ICH (intracerebral hemorrhage) ([place]) 01/05/2020    Electa Sniff, PT, DPT, NCS 03/16/2020, 11:09 AM  [facility] [address] [place], [place], [address] Phone: [phone]   Fax:  [phone]  Name: MACIL CRADY MRN: [id number] Date of Birth: Dec 18, 1955

## 2020-03-16 NOTE — Therapy (Signed)
[facility] [address] [place], [place], [address] Phone: [phone]   Fax:  [phone]  Occupational Therapy Treatment  Patient Details  Name: Erika Stanton MRN: [id number] Date of Birth: 01-09-1956 Referring Provider (OT): Dr. Naaman Plummer   Encounter Date: 03/16/2020   OT End of Session - 03/16/20 1104    Visit Number 4    Number of Visits 25    Date for OT Re-Evaluation 05/25/20    Authorization Time [place]    OT Start Time 1102    OT Stop Time 1145    OT Time Calculation (min) 43 min    Activity Tolerance Patient tolerated treatment well    Behavior During Therapy [facility] for tasks assessed/performed           Past Medical History:  Diagnosis Date  . Melena     Past Surgical History:  Procedure Laterality Date  . CHOLECYSTECTOMY    . COLONOSCOPY  11/2017   at [facility]. no recurrent polyps.  suggest repeat surveillance study 11/2022.    Marland Kitchen COLONOSCOPY W/ POLYPECTOMY  06/2014   Dr Roxy Manns at [facility].  3 adenomatoous polyps, anal fissure.    . ESOPHAGOGASTRODUODENOSCOPY (EGD) WITH PROPOFOL N/A 01/27/2020   Procedure: ESOPHAGOGASTRODUODENOSCOPY (EGD) WITH PROPOFOL;  Surgeon: Mauri Pole, MD;  Location: [facility];  Service: Endoscopy;  Laterality: N/A;  . IR 3D INDEPENDENT WKST  01/06/2020  . IR ANGIO INTRA EXTRACRAN SEL INTERNAL CAROTID BILAT MOD SED  01/06/2020  . IR ANGIO VERTEBRAL SEL VERTEBRAL UNI L MOD SED  01/06/2020  . IR ANGIOGRAM FOLLOW UP STUDY  01/06/2020  . IR ANGIOGRAM FOLLOW UP STUDY  01/06/2020  . IR ANGIOGRAM FOLLOW UP STUDY  01/06/2020  . IR ANGIOGRAM FOLLOW UP STUDY  01/06/2020  . IR ANGIOGRAM FOLLOW UP STUDY  01/06/2020  . IR ANGIOGRAM FOLLOW UP STUDY  01/06/2020  . IR ANGIOGRAM FOLLOW UP STUDY  01/06/2020  . IR ANGIOGRAM FOLLOW UP STUDY  01/06/2020  . IR ANGIOGRAM FOLLOW UP STUDY  01/06/2020  . IR ANGIOGRAM FOLLOW UP STUDY  01/06/2020  . IR NEURO EACH ADD'L AFTER BASIC UNI RIGHT (MS)   01/06/2020  . IR TRANSCATH/EMBOLIZ  01/06/2020  . RADIOLOGY WITH ANESTHESIA N/A 01/06/2020   Procedure: IR WITH ANESTHESIA FOR ANEURYSM;  Surgeon: Consuella Lose, MD;  Location: [facility];  Service: Radiology;  Laterality: N/A;  . TUBAL LIGATION      There were no vitals filed for this visit.   Subjective Assessment - 03/16/20 1102    Subjective  Pt reports "I always have pain in my arm" about a 2 at rest and about a 4 with movement    Pertinent History ICH 01/06/20, Aneurysm. PMH: OA bilateral knees and hands    Currently in Pain? Yes    Pain Score 2     Pain Location Arm    Pain Orientation Right    Pain Descriptors / Indicators Aching    Pain Type Chronic pain    Pain Onset 1 to 4 weeks ago    Pain Frequency Constant                        OT Treatments/Exercises (OP) - 03/16/20 1137      Neurological Re-education Exercises   Other Exercises 1 supine ROM with RUE. Pt with high guarding and intense pain in RUE limiting therapeutic interventions. Pt demonstrated active movement in elbow flexion/extension in supine with max  A for support at elbow and hand. Pt reported pain at any movement in RUE shoulder. Pt encourgaed to try and limit guarding however poor carryover with the decreasing of guarding of RUE shoulder.    Other Exercises 2 Pt VERY limited by pain with shoulder and could only provide ROM for gently joint mobs      Modalities   Modalities Moist Heat      Moist Heat Therapy   Number Minutes Moist Heat 15 Minutes    Moist Heat Location Shoulder   RUE     Splinting   Splinting issued velcro for already issued splint      Manual Therapy   Manual Therapy Passive ROM    Passive ROM RUE                    OT Short Term Goals - 03/02/20 1521      OT SHORT TERM GOAL #1   Title I with initialHEP.    Time 6    Period Weeks    Status New    Target Date 04/13/20      OT SHORT TERM GOAL #2   Title Pt will perform bathing with min A.    Time 6     Period Weeks    Status New      OT SHORT TERM GOAL #3   Title Pt will donn shirt and pants with min A    Time 6    Period Weeks    Status New      OT SHORT TERM GOAL #4   Title Pt will demonstrate 30 A/ROM shoulder flexion for RUE with pain less than or equal to 4/10.    Time 6    Period Weeks    Status New      OT SHORT TERM GOAL #5   Title Pt will  demonstrate improved LUE fine motor coordination for ADLs as evidenced by decreasing 9 hole peg test score to 45 secs or less.    Time 6    Period Weeks    Status New      OT SHORT TERM GOAL #6   Title Pt/ caregiver will verbalize understanding of RUE positioning and edema control techniques to minimize pain and risk for injury    Time 6    Period Weeks    Status New      OT SHORT TERM GOAL #7   Title Pt will demonstrate at least 25% finger flexion/ extension in RUE in prep for functional use.    Time 6    Period Weeks    Status New             OT Long Term Goals - 03/02/20 1527      OT LONG TERM GOAL #1   Title Pt will perfrom  all basic ADLS with supervision    Time 12    Period Weeks    Status New      OT LONG TERM GOAL #2   Title Pt will demonstrate 45* RUE shoulder flexion in prep for functional reach with pain less than or equal to 3/10.    Time 12    Period Weeks    Status New      OT LONG TERM GOAL #3   Title Pt will use RUE as a stabilizer/ gross A at least 30 % of the time for ADLs/ IADLs.    Time 12    Period Weeks  Status New      OT LONG TERM GOAL #4   Title Pt will increase LUE grip strength to 25 lbs or greater for increased ease with ADLs.    Time 12    Period Weeks    Status New      OT LONG TERM GOAL #5   Title Pt will perform simple home management/ snack prep with min A.    Time 12    Period Weeks    Status New      Long Term Additional Goals   Additional Long Term Goals Yes      OT LONG TERM GOAL #6   Title Pt will demonstrate ability to grasp/ release a cup 2/3 trials  with RUE.    Time 12    Period Weeks    Status New      OT LONG TERM GOAL #7   Title Pt will safely navigate a busy environment and locate items with supervision and  90% or greater accuracy without bumping into items.    Time 12    Period Weeks    Status New      OT LONG TERM GOAL #8   Title I with updated HEP.    Time 12    Period Weeks    Status New                 Plan - 03/16/20 1104    Clinical Impression Statement Pt continues to be limited by pain and fear of RUE pain. Pt reluctant to allow movement and very guarded. Humeral head is anteriorly placed and scapula elevated.    OT Occupational Profile and History Detailed Assessment- Review of Records and additional review of physical, cognitive, psychosocial history related to current functional performance    Occupational performance deficits (Please refer to evaluation for details): ADL's;IADL's;Rest and Sleep;Work;Play;Leisure    Body Structure / Function / Physical Skills ADL;Balance;Coordination;Decreased knowledge of precautions;Decreased knowledge of use of DME;Dexterity;Edema;Mobility;Tone;Strength;IADL;Sensation;GMC;Gait;ROM;FMC;Flexibility;Pain;Vision;UE functional use;Endurance    Cognitive Skills Attention;Perception;Problem Solve;Safety Awareness;Thought    Rehab Potential Good    Clinical Decision Making Several treatment options, min-mod task modification necessary    Comorbidities Affecting Occupational Performance: None    Modification or Assistance to Complete Evaluation  Min-Moderate modification of tasks or assist with assess necessary to complete eval    OT Frequency 2x / week    OT Duration 12 weeks    OT Treatment/Interventions Self-care/ADL training;Ultrasound;Energy conservation;Visual/perceptual remediation/compensation;Patient/family education;DME and/or AE instruction;Aquatic Therapy;Paraffin;Gait Training;Passive range of motion;Balance training;Fluidtherapy;Electrical Stimulation;Functional  Mobility Training;Splinting;Moist Heat;Therapeutic exercise;Manual Therapy;Cognitive remediation/compensation;Manual lymph drainage;Neuromuscular education;Coping strategies training    Plan review HEP, review sidelying exercise, consider aquatic therapy    Consulted and Agree with Plan of Care Patient;Family member/caregiver    Family Member Consulted spouse present           Patient will benefit from skilled therapeutic intervention in order to improve the following deficits and impairments:   Body Structure / Function / Physical Skills: ADL, Balance, Coordination, Decreased knowledge of precautions, Decreased knowledge of use of DME, Dexterity, Edema, Mobility, Tone, Strength, IADL, Sensation, GMC, Gait, ROM, FMC, Flexibility, Pain, Vision, UE functional use, Endurance Cognitive Skills: Attention, Perception, Problem Solve, Safety Awareness, Thought     Visit Diagnosis: Acute pain of right shoulder  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 GIB (gastrointestinal bleeding) 02/28/2020  . Elevated BUN   . Prediabetes   . 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 Cerebral vasospasm   . Sinus tachycardia   . Dysphagia, post-stroke   . Thrombocytopenia ([place])   . Acute blood loss anemia   . Brain aneurysm   . ICH (intracerebral hemorrhage) ([place]) 01/05/2020    Zachery Conch MOT, OTR/L  03/16/2020, 12:53 PM  [facility] [address] [place], [place], [address] Phone: [phone]   Fax:  [phone]  Name: Erika Stanton MRN: [id number] Date of Birth: 1955/09/04

## 2020-03-16 NOTE — Patient Instructions (Signed)
Set alarms in your phone for oding your PT and OT exercises.

## 2020-03-16 NOTE — Therapy (Signed)
[facility] [address] [place], [place], [address] Phone: [phone]   Fax:  [phone]  Speech Language Pathology Treatment  Patient Details  Name: Erika Stanton MRN: [id number] Date of Birth: 11/09/1955 Referring Provider (SLP): Alger Simons, MD   Encounter Date: 03/16/2020   End of Session - 03/16/20 1317    Visit Number 4    Number of Visits 17    Date for SLP Re-Evaluation 05/27/20   90 days   SLP Start Time 1154    SLP Stop Time  9798    SLP Time Calculation (min) 40 min    Activity Tolerance Patient tolerated treatment well           Past Medical History:  Diagnosis Date  . Melena     Past Surgical History:  Procedure Laterality Date  . CHOLECYSTECTOMY    . COLONOSCOPY  11/2017   at [facility]. no recurrent polyps.  suggest repeat surveillance study 11/2022.    Marland Kitchen COLONOSCOPY W/ POLYPECTOMY  06/2014   Dr Roxy Manns at [facility].  3 adenomatoous polyps, anal fissure.    . ESOPHAGOGASTRODUODENOSCOPY (EGD) WITH PROPOFOL N/A 01/27/2020   Procedure: ESOPHAGOGASTRODUODENOSCOPY (EGD) WITH PROPOFOL;  Surgeon: Mauri Pole, MD;  Location: [facility];  Service: Endoscopy;  Laterality: N/A;  . IR 3D INDEPENDENT WKST  01/06/2020  . IR ANGIO INTRA EXTRACRAN SEL INTERNAL CAROTID BILAT MOD SED  01/06/2020  . IR ANGIO VERTEBRAL SEL VERTEBRAL UNI L MOD SED  01/06/2020  . IR ANGIOGRAM FOLLOW UP STUDY  01/06/2020  . IR ANGIOGRAM FOLLOW UP STUDY  01/06/2020  . IR ANGIOGRAM FOLLOW UP STUDY  01/06/2020  . IR ANGIOGRAM FOLLOW UP STUDY  01/06/2020  . IR ANGIOGRAM FOLLOW UP STUDY  01/06/2020  . IR ANGIOGRAM FOLLOW UP STUDY  01/06/2020  . IR ANGIOGRAM FOLLOW UP STUDY  01/06/2020  . IR ANGIOGRAM FOLLOW UP STUDY  01/06/2020  . IR ANGIOGRAM FOLLOW UP STUDY  01/06/2020  . IR ANGIOGRAM FOLLOW UP STUDY  01/06/2020  . IR NEURO EACH ADD'L AFTER BASIC UNI RIGHT (MS)  01/06/2020  . IR TRANSCATH/EMBOLIZ  01/06/2020  . RADIOLOGY WITH ANESTHESIA  N/A 01/06/2020   Procedure: IR WITH ANESTHESIA FOR ANEURYSM;  Surgeon: Consuella Lose, MD;  Location: [facility];  Service: Radiology;  Laterality: N/A;  . TUBAL LIGATION      There were no vitals filed for this visit.   Subjective Assessment - 03/16/20 1209    Subjective Pt enters today telling SLP that she has not made any errors wiht meds in last 3 days.    Currently in Pain? Yes    Pain Score 2     Pain Location Arm    Pain Orientation Right    Pain Descriptors / Indicators Aching    Pain Type Chronic pain    Aggravating Factors  movement "messing with it"    Pain Relieving Factors not moving/rest                 ADULT SLP TREATMENT - 03/16/20 1210      General Information   Behavior/Cognition Alert;Cooperative;Pleasant mood;Impulsive      Treatment Provided   Treatment provided Cognitive-Linquistic      Cognitive-Linquistic Treatment   Treatment focused on Cognition    Skilled Treatment SLP asked pt meds and pt has not had an error in last 3 days, pt reports. SLP assisted pt in performing simple tasks with her phone - setting an alarm for when  she needs to check her email to link SLP account to pt's [facility] account. Pt req'd min A usually to decr impulsivity. SLP asked pt to remember to check her email for a link to link pt account with SLP [facility] [employer] accounts. "I'd just ask Ronalee Belts," pt stated. SLP  provided pt cues for setting an alarm in her phone. SLP assisted pt in setting an alarm providing min A mainly for impulsivity and emergent awareness for incorrect typing of the label for the alarm. SLP encouraged pt to slow down to decr impulsivity, wiht slower thought/movement for approx 20 seconds then pt returned to more impulsive movements.       Assessment / Recommendations / Plan   Plan Continue with current plan of care            SLP Education - 03/16/20 1317    Education Details slowing down important and makes pt more accurate, setting alarms good for reminding  pt about things    Person(s) Educated Patient;Spouse    Methods Explanation;Demonstration;Verbal cues    Comprehension Verbalized understanding;Returned demonstration;Verbal cues required;Need further instruction            SLP Short Term Goals - 03/16/20 1319      SLP [place] #1   Title pt to complete cognitive linguistic testing    Period --   or 4 total sessions   Status Achieved      SLP SHORT TERM GOAL #2   Title pt will demo emergent awareness 100% with min complex therapy tasks x3 visits    Time 3    Period Weeks    Status On-going      SLP SHORT TERM GOAL #3   Title pt will demo appriopriate aspiration precuations with suggested POs x3 sessions    Time 3    Period Weeks    Status On-going      SLP SHORT TERM GOAL #4   Title pt will solve household probelms using functional solutions x3 sessions    Time 3    Period Weeks    Status On-going      SLP SHORT TERM GOAL #5   Title pt will demo selective attention (internal distraction) to a simple functional task for 10 minutes with rare min A back to task in 3 sessions    Time 3    Period Weeks    Status On-going            SLP Long Term Goals - 03/16/20 1319      SLP LONG TERM GOAL #1   Title pt will demo anticipatory awareness by double checking all work x5 sessions    Time 7    Period Weeks   or 17 total sessions, for all LTGs   Status On-going      SLP LONG TERM GOAL #2   Title pt will follow swallow precautions with POs with modified independence x3 sessions    Time 7    Period Weeks    Status On-going      SLP LONG TERM GOAL #3   Title pt will demo [facility] skills in solving mod complex problems in WNL amount of time with modified independence (double checking answers , etc)    Time 7    Period Weeks    Status On-going      SLP LONG TERM GOAL #4   Title pt will demo altaernating attention in simple functional cognitive linguistic tasks in 3 sessions  Time 7    Status On-going             Plan - 03/16/20 1318    Clinical Impression Statement [name] cont with noted impulsivity, decr'd awareness,  and decr'd simple executive function (problem solving) deficits. 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 She is leaning towards early retirement.    Speech Therapy Frequency 2x / week    Duration 8 weeks   or 17 total sessions   Treatment/Interventions Aspiration precaution training;Pharyngeal strengthening exercises;Compensatory techniques;Diet toleration management by SLP;Trials of upgraded texture/liquids;Cueing hierarchy;Cognitive reorganization;Internal/external aids;Patient/family education;SLP instruction and feedback;Functional tasks    Potential to Achieve Goals Good    Consulted and Agree with Plan of Care Patient           Patient will benefit from skilled therapeutic intervention in order to improve the following deficits and impairments:   Cognitive communication deficit  Dysphagia, oropharyngeal phase    Problem List Patient Active Problem List   Diagnosis Date Noted  . GIB (gastrointestinal bleeding) 02/28/2020  . Elevated BUN   . Prediabetes   . 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[place]) 01/20/2020  . Cerebral vasospasm   . Sinus tachycardia   . Dysphagia, post-stroke   . Thrombocytopenia ([place])   . Acute blood loss anemia   . Brain aneurysm   . ICH (intracerebral hemorrhage) ([place]) 01/05/2020    [facility] ,[place], [place]  03/16/2020, 1:20 PM  [facility] [address] [place], [place], [address] Phone: [phone]   Fax:  [phone]   Name: Erika Stanton MRN: [id number] Date of Birth: Oct 19, 1955

## 2020-03-18 ENCOUNTER — Ambulatory Visit: Payer: BC Managed Care – PPO

## 2020-03-18 ENCOUNTER — Other Ambulatory Visit: Payer: Self-pay

## 2020-03-18 DIAGNOSIS — R2689 Other abnormalities of gait and mobility: Secondary | ICD-10-CM | POA: Diagnosis not present

## 2020-03-18 DIAGNOSIS — R41841 Cognitive communication deficit: Secondary | ICD-10-CM

## 2020-03-18 DIAGNOSIS — M6281 Muscle weakness (generalized): Secondary | ICD-10-CM

## 2020-03-18 DIAGNOSIS — R1312 Dysphagia, oropharyngeal phase: Secondary | ICD-10-CM

## 2020-03-18 NOTE — Patient Instructions (Signed)
   Try and do Talk [facility] for total (at least) 60 minutes a day, at least 4 days a week. You might need a rest break every 15-20 minutes.

## 2020-03-18 NOTE — Therapy (Signed)
[facility] [address] [place], [place], [address] Phone: [phone]   Fax:  [phone]  Speech Language Pathology Treatment  Patient Details  Name: Erika Stanton MRN: [id number] Date of Birth: [birth year] Referring Provider (SLP): Alger Simons, MD   Encounter Date: 03/18/2020   End of Session - 03/18/20 1319    Visit Number 5    Number of Visits 17    Date for SLP Re-Evaluation 05/27/20   90 days   SLP Start Time 1150    SLP Stop Time  1233    SLP Time Calculation (min) 43 min    Activity Tolerance Patient tolerated treatment well           Past Medical History:  Diagnosis Date  . Melena     Past Surgical History:  Procedure Laterality Date  . CHOLECYSTECTOMY    . COLONOSCOPY  11/2017   at [facility]. no recurrent polyps.  suggest repeat surveillance study 11/2022.    Marland Kitchen COLONOSCOPY W/ POLYPECTOMY  06/2014   Dr Roxy Manns at [facility]  3 adenomatoous polyps, anal fissure.    . ESOPHAGOGASTRODUODENOSCOPY (EGD) WITH PROPOFOL N/A 01/27/2020   Procedure: ESOPHAGOGASTRODUODENOSCOPY (EGD) WITH PROPOFOL;  Surgeon: Mauri Pole, MD;  Location: [facility];  Service: Endoscopy;  Laterality: N/A;  . IR 3D INDEPENDENT WKST  01/06/2020  . IR ANGIO INTRA EXTRACRAN SEL INTERNAL CAROTID BILAT MOD SED  01/06/2020  . IR ANGIO VERTEBRAL SEL VERTEBRAL UNI L MOD SED  01/06/2020  . IR ANGIOGRAM FOLLOW UP STUDY  01/06/2020  . IR ANGIOGRAM FOLLOW UP STUDY  01/06/2020  . IR ANGIOGRAM FOLLOW UP STUDY  01/06/2020  . IR ANGIOGRAM FOLLOW UP STUDY  01/06/2020  . IR ANGIOGRAM FOLLOW UP STUDY  01/06/2020  . IR ANGIOGRAM FOLLOW UP STUDY  01/06/2020  . IR ANGIOGRAM FOLLOW UP STUDY  01/06/2020  . IR ANGIOGRAM FOLLOW UP STUDY  01/06/2020  . IR ANGIOGRAM FOLLOW UP STUDY  01/06/2020  . IR ANGIOGRAM FOLLOW UP STUDY  01/06/2020  . IR NEURO EACH ADD'L AFTER BASIC UNI RIGHT (MS)  01/06/2020  . IR TRANSCATH/EMBOLIZ  01/06/2020  . RADIOLOGY WITH ANESTHESIA  N/A 01/06/2020   Procedure: IR WITH ANESTHESIA FOR ANEURYSM;  Surgeon: Consuella Lose, MD;  Location: [facility];  Service: Radiology;  Laterality: N/A;  . TUBAL LIGATION      There were no vitals filed for this visit.   Subjective Assessment - 03/18/20 1159    Subjective Pt made two alarms for doing her exercises (Tuesday and Thursday).    Currently in Pain? Yes    Pain Score 2     Pain Location Shoulder    Pain Orientation Right    Pain Descriptors / Indicators Aching    Pain Type Chronic pain                 ADULT SLP TREATMENT - 03/18/20 1201      General Information   Behavior/Cognition Alert;Cooperative;Impulsive;Pleasant mood      Treatment Provided   Treatment provided Cognitive-Linquistic      Cognitive-Linquistic Treatment   Treatment focused on Cognition    Skilled Treatment "Ronalee Belts has to go to [employer] office to ask them about paperwork for my disability." Pt continues to report husband sets up her meds and she takes them, but has not made errors. She recalled what segments she completed with talk path last night. SLP told pt she should do at least an hour a day, at least  4 days a week.  SLP spent enire session working with pt impulsivity to slow down responses with talk path tasks. SLP, at one time, had pt put down her stylus and listen to SLP directions, as she could not cease her activity requested by SLP in order to attend to the next part of the instructions.        Assessment / Recommendations / Plan   Plan Continue with current plan of care      Progression Toward Goals   Progression toward goals Not progressing toward goals (comment)   due to decr impulsivity/attention            SLP Education - 03/18/20 1318    Education Details need to slow down responses, talk path 60 mointues a day, 4 days a week and may need breaks every 15-20 minutes    Person(s) Educated Patient    Methods Explanation    Comprehension Verbalized understanding;Need further  instruction            SLP Short Term Goals - 03/16/20 1319      SLP SHORT TERM GOAL #1   Title pt to complete cognitive linguistic testing    Period --   or 4 total sessions   Status Achieved      SLP SHORT TERM GOAL #2   Title pt will demo emergent awareness 100% with min complex therapy tasks x3 visits    Time 3    Period Weeks    Status On-going      SLP SHORT TERM GOAL #3   Title pt will demo appriopriate aspiration precuations with suggested POs x3 sessions    Time 3    Period Weeks    Status On-going      SLP SHORT TERM GOAL #4   Title pt will solve household probelms using functional solutions x3 sessions    Time 3    Period Weeks    Status On-going      SLP SHORT TERM GOAL #5   Title pt will demo selective attention (internal distraction) to a simple functional task for 10 minutes with rare min A back to task in 3 sessions    Time 3    Period Weeks    Status On-going            SLP Long Term Goals - 03/16/20 1319      SLP LONG TERM GOAL #1   Title pt will demo anticipatory awareness by double checking all work x5 sessions    Time 7    Period Weeks   or 17 total sessions, for all LTGs   Status On-going      SLP LONG TERM GOAL #2   Title pt will follow swallow precautions with POs with modified independence x3 sessions    Time 7    Period Weeks    Status On-going      SLP LONG TERM GOAL #3   Title pt will demo [facility] skills in solving mod complex problems in WNL amount of time with modified independence (double checking answers , etc)    Time 7    Period Weeks    Status On-going      SLP LONG TERM GOAL #4   Title pt will demo altaernating attention in simple functional cognitive linguistic tasks in 3 sessions    Time 7    Status On-going            Plan - 03/18/20 1319  Clinical Impression Statement Shomari cont with noted impulsivity and decr'd focused attention which are beginning to hinder success in therapy tasks. Awareness and simple  executive function (problem solving) deficits also remain apparent. 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 She is leaning towards early retirement.    Speech Therapy Frequency 2x / week    Duration 8 weeks   or 17 total sessions   Treatment/Interventions Aspiration precaution training;Pharyngeal strengthening exercises;Compensatory techniques;Diet toleration management by SLP;Trials of upgraded texture/liquids;Cueing hierarchy;Cognitive reorganization;Internal/external aids;Patient/family education;SLP instruction and feedback;Functional tasks    Potential to Achieve Goals Good    Consulted and Agree with Plan of Care Patient           Patient will benefit from skilled therapeutic intervention in order to improve the following deficits and impairments:   Cognitive communication deficit  Dysphagia, oropharyngeal phase    Problem List Patient Active Problem List   Diagnosis Date Noted  . GIB (gastrointestinal bleeding) 02/28/2020  . Elevated BUN   . Prediabetes   . 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[place]) 01/20/2020  . Cerebral vasospasm   . Sinus tachycardia   . Dysphagia, post-stroke   . Thrombocytopenia ([place])   . Acute blood loss anemia   . Brain aneurysm   . ICH (intracerebral hemorrhage) ([place]) 01/05/2020    [facility] ,[place], [place]  03/18/2020, 1:23 PM  [facility] [address] [place], [place], [address] Phone: [phone]   Fax:  [phone]   Name: Erika Stanton MRN: [id number] Date of Birth: 1955/10/13

## 2020-03-18 NOTE — Therapy (Signed)
[facility] [address] [place], [place], [address] Phone: [phone]   Fax:  [phone]  Physical Therapy Treatment  Patient Details  Name: Erika Stanton MRN: [id number] Date of Birth: 07-11-1955 Referring Provider (PT): Alger Simons, MD   Encounter Date: 03/18/2020   PT End of Session - 03/18/20 1246    Visit Number 9    Number of Visits 21    Date for PT Re-Evaluation 04/25/20    Authorization Type [employer] State    PT Start Time 1245   late getting out of ST   PT Stop Time 1315    PT Time Calculation (min) 30 min    Equipment Utilized During Treatment Gait belt    Activity Tolerance Patient tolerated treatment well    Behavior During Therapy [facility] for tasks assessed/performed           Past Medical History:  Diagnosis Date  . Melena     Past Surgical History:  Procedure Laterality Date  . CHOLECYSTECTOMY    . COLONOSCOPY  11/2017   at [facility]. no recurrent polyps.  suggest repeat surveillance study 11/2022.    Marland Kitchen COLONOSCOPY W/ POLYPECTOMY  06/2014   Dr Roxy Manns at [facility].  3 adenomatoous polyps, anal fissure.    . ESOPHAGOGASTRODUODENOSCOPY (EGD) WITH PROPOFOL N/A 01/27/2020   Procedure: ESOPHAGOGASTRODUODENOSCOPY (EGD) WITH PROPOFOL;  Surgeon: Mauri Pole, MD;  Location: [facility];  Service: Endoscopy;  Laterality: N/A;  . IR 3D INDEPENDENT WKST  01/06/2020  . IR ANGIO INTRA EXTRACRAN SEL INTERNAL CAROTID BILAT MOD SED  01/06/2020  . IR ANGIO VERTEBRAL SEL VERTEBRAL UNI L MOD SED  01/06/2020  . IR ANGIOGRAM FOLLOW UP STUDY  01/06/2020  . IR ANGIOGRAM FOLLOW UP STUDY  01/06/2020  . IR ANGIOGRAM FOLLOW UP STUDY  01/06/2020  . IR ANGIOGRAM FOLLOW UP STUDY  01/06/2020  . IR ANGIOGRAM FOLLOW UP STUDY  01/06/2020  . IR ANGIOGRAM FOLLOW UP STUDY  01/06/2020  . IR ANGIOGRAM FOLLOW UP STUDY  01/06/2020  . IR ANGIOGRAM FOLLOW UP STUDY  01/06/2020  . IR ANGIOGRAM FOLLOW UP STUDY  01/06/2020  . IR ANGIOGRAM FOLLOW UP  STUDY  01/06/2020  . IR NEURO EACH ADD'L AFTER BASIC UNI RIGHT (MS)  01/06/2020  . IR TRANSCATH/EMBOLIZ  01/06/2020  . RADIOLOGY WITH ANESTHESIA N/A 01/06/2020   Procedure: IR WITH ANESTHESIA FOR ANEURYSM;  Surgeon: Consuella Lose, MD;  Location: [facility];  Service: Radiology;  Laterality: N/A;  . TUBAL LIGATION      There were no vitals filed for this visit.   Subjective Assessment - 03/18/20 1246    Subjective Pt reports she is doing ok. She is still having some pain in right arm that is biggest issue.    Patient is accompained by: Family member   [name]   Limitations Walking;Standing;House hold activities    How long can you walk comfortably? Did a little walking yesterday up/down the hallway    Patient Stated Goals Use my right hand like I used to.    Currently in Pain? Yes    Pain Location Arm    Pain Orientation Right    Pain Descriptors / Indicators Aching    Pain Type Chronic pain;Neuropathic pain    Pain Onset 1 to 4 weeks ago                             [facility] Adult PT Treatment/Exercise -  03/18/20 1248      Transfers   Transfers Sit to Stand;Stand to Sit    Sit to Stand 6: Modified independent (Device/Increase time)    Stand to Sit 6: Modified independent (Device/Increase time)      Ambulation/Gait   Ambulation/Gait Yes    Ambulation/Gait Assistance 5: Supervision    Ambulation/Gait Assistance Details 4/10 RPE after gait. Pt was cued to relax right arm at side    Ambulation Distance (Feet) 805 Feet    Assistive device None    Gait Pattern Step-through pattern;Decreased arm swing - right    Ambulation Surface Level;Indoor      Self-Care   Self-Care Other Self-Care Comments    Other Self-Care Comments  Pt tearful during visit voicing concerns that she is not doing enough for right arm. Reports that she just keeps hearing that. PT explained to pt that the most we could ask for was for her to work on what the therapists have given to her. She  reported that the pain in arm made her not do as much as the OT had said. PT advised to discuss with OT if something was hurting so that could help with modifications to help. Also discussed how it is common to feel down after significant medical change like a stroke and that she should talk to Dr. Ranell Patrick next week and let her know how she was feeling. She could perhaps offer medical intervention or someone to speak to. PT going to check on any local support groups for pt. PT encouraged pt to try to look at the good things she can do now such as how much walking has improved and try not to focus so much on what she can't do. Discussed with husband about not constantly harping on her arm and he verbalized that he has realized this is upsetting to pt. Asked pt what she enjoys doing and encouraged her to try to take some time for things she enjoys as well.       Neuro Re-ed    Neuro Re-ed Details  Dynamic gait activities in hallway 40' x 2:  marching gait, gait with head turns left/right, head turns up/down, side stepping.  Pt was given verbal cues for form with activities. 1 brief seated rest break during performance.                    PT Short Term Goals - 03/01/20 1301      PT SHORT TERM GOAL #1   Title Pt/spouse will be independent with initial HEP in order to indicate decreased fall risk and improved functional mobility.  (Target Date: 03/26/20)    Time 4    Period Weeks    Status New    Target Date 03/26/20      PT SHORT TERM GOAL #2   Title Pt will complete BERG balance test and improve score by 6 points from baseline in order to indicate decreased fall risk.    Baseline 46/56 on 03/01/20    Time 4    Period Weeks    Status New      PT SHORT TERM GOAL #3   Title Pt will improve 5TSS to </=25 secs with single UE support in order to indicate dec fall risk and improved functional strength.    Time 4    Period Weeks    Status New      PT SHORT TERM GOAL #4   Title Pt will  improve gait speed  to >/=2.4 ft/sec with LRAD in order to indicate dec fall risk.    Time 4    Period Weeks    Status New      PT SHORT TERM GOAL #5   Title Pt will ambulate x 78' at S level with LRAD in simulated household environment in order to indicate improved independence in her home.    Time 4    Period Weeks    Status New             PT Long Term Goals - 03/01/20 1302      PT LONG TERM GOAL #1   Title Pt/spouse will be independent with final HEP in order to indicate dec fall risk and improved functional mobility.  (Target Date: 04/25/20)    Time 8    Period Weeks    Status New      PT LONG TERM GOAL #2   Title Pt will improve BERG balance test score by 10 points from baseline in order to indicate decreased fall risk.    Baseline 03/01/20 46/56    Time 8    Period Weeks    Status New      PT LONG TERM GOAL #3   Title Pt will perform 5TSS in </=20 secs without UE support in order to indicate improved functional strength    Time 8    Period Weeks    Status New      PT LONG TERM GOAL #4   Title Pt will ambulate x 500' w/ LRAD at S level over unlevel paved and grassy surfaces, including ramp/curb in order to indicate safe community mobility.    Time 8    Period Weeks    Status New      PT LONG TERM GOAL #5   Title Pt will negotiate up/down 4 stairs with single L rail at mod I level in order to indicate safe home entry/exit.    Time 8    Period Weeks    Status New                 Plan - 03/18/20 1602    Clinical Impression Statement PT focused more on improving activity tolerance with longer gait and dynamic gait activities. Pt is showing improving stability with gait and was able to increase distance. Doing better relaxing right arm at side with gait. Spent time discussing pts concerns with arm as she was tearful during session.    Personal Factors and Comorbidities Age;Comorbidity 1;Profession    Comorbidities B ICHs    Examination-Activity  Limitations Bend;Carry;Lift;Locomotion Level;Reach Overhead;Squat;Stairs;Stand;Transfers    Examination-Participation Restrictions Cleaning;Community Activity;Driving;Occupation;Yard Work;Laundry;Meal Prep    Stability/Clinical Decision Making Evolving/Moderate complexity    Rehab Potential Good    PT Frequency 3x / week   then 2x/wk for 4 weeks   PT Duration 4 weeks   then 2x/wk for 4 weeks   PT Treatment/Interventions ADLs/Self Care Home Management;Aquatic Therapy;Electrical Stimulation;DME Instruction;Gait training;Stair training;Functional mobility training;Therapeutic activities;Therapeutic exercise;Balance training;Neuromuscular re-education;Cognitive remediation;Patient/family education;Orthotic Fit/Training;Manual techniques;Passive range of motion;Dry needling;Energy conservation;Vestibular;Visual/perceptual remediation/compensation    PT Next Visit Plan STG check due end of next week. Next week is last week for 3x/week. Make sure visits decrease to 2x/wk after that. Focus on trunk and shoulder activation with gait and mobility tasks. Continue gait without AD with cues to relax RUE and balance training as well as functional strengthening.    Consulted and Agree with Plan of Care Patient;Family member/caregiver  Patient will benefit from skilled therapeutic intervention in order to improve the following deficits and impairments:  Abnormal gait, Decreased activity tolerance, Decreased balance, Decreased cognition, Decreased endurance, Decreased knowledge of use of DME, Decreased mobility, Decreased strength, Impaired perceived functional ability, Impaired flexibility, Impaired sensation, Postural dysfunction, Improper body mechanics, Impaired vision/preception, Impaired UE functional use, Impaired tone  Visit Diagnosis: Other abnormalities of gait and mobility  Muscle weakness (generalized)     Problem List Patient Active Problem List   Diagnosis Date Noted  . GIB  (gastrointestinal bleeding) 02/28/2020  . Elevated BUN   . Prediabetes   . 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[place]) 01/20/2020  . Cerebral vasospasm   . Sinus tachycardia   . Dysphagia, post-stroke   . Thrombocytopenia ([place])   . Acute blood loss anemia   . Brain aneurysm   . ICH (intracerebral hemorrhage) ([place]) 01/05/2020    Electa Sniff, PT, DPT, NCS 03/18/2020, 4:05 PM  [facility] [address] [place], [place], [address] Phone: [phone]   Fax:  [phone]  Name: Erika Stanton MRN: [id number] Date of Birth: [birth year]

## 2020-03-21 ENCOUNTER — Ambulatory Visit: Payer: BC Managed Care – PPO

## 2020-03-21 ENCOUNTER — Other Ambulatory Visit: Payer: Self-pay

## 2020-03-21 ENCOUNTER — Ambulatory Visit: Payer: BC Managed Care – PPO | Admitting: Occupational Therapy

## 2020-03-21 DIAGNOSIS — R2689 Other abnormalities of gait and mobility: Secondary | ICD-10-CM

## 2020-03-21 DIAGNOSIS — I69159 Hemiplegia and hemiparesis following nontraumatic intracerebral hemorrhage affecting unspecified side: Secondary | ICD-10-CM

## 2020-03-21 DIAGNOSIS — M6281 Muscle weakness (generalized): Secondary | ICD-10-CM

## 2020-03-21 DIAGNOSIS — R1312 Dysphagia, oropharyngeal phase: Secondary | ICD-10-CM

## 2020-03-21 DIAGNOSIS — R41841 Cognitive communication deficit: Secondary | ICD-10-CM

## 2020-03-21 DIAGNOSIS — M25511 Pain in right shoulder: Secondary | ICD-10-CM

## 2020-03-21 DIAGNOSIS — R2681 Unsteadiness on feet: Secondary | ICD-10-CM

## 2020-03-21 NOTE — Therapy (Signed)
[facility] [address] [place], [place], [address] Phone: [phone]   Fax:  [phone]  Speech Language Pathology Treatment  Patient Details  Name: Erika Stanton MRN: [id number] Date of Birth: Feb 16, 1956 Referring Provider (SLP): Alger Simons, MD   Encounter Date: 03/21/2020   End of Session - 03/21/20 1412    Visit Number 6    Number of Visits 17    Date for SLP Re-Evaluation 05/27/20   90 days   SLP Start Time 33    SLP Stop Time  1400    SLP Time Calculation (min) 42 min    Activity Tolerance Patient tolerated treatment well           Past Medical History:  Diagnosis Date  . Melena     Past Surgical History:  Procedure Laterality Date  . CHOLECYSTECTOMY    . COLONOSCOPY  11/2017   at [facility]. no recurrent polyps.  suggest repeat surveillance study 11/2022.    Marland Kitchen COLONOSCOPY W/ POLYPECTOMY  06/2014   Dr Roxy Manns at [facility].  3 adenomatoous polyps, anal fissure.    . ESOPHAGOGASTRODUODENOSCOPY (EGD) WITH PROPOFOL N/A 01/27/2020   Procedure: ESOPHAGOGASTRODUODENOSCOPY (EGD) WITH PROPOFOL;  Surgeon: Mauri Pole, MD;  Location: [facility];  Service: Endoscopy;  Laterality: N/A;  . IR 3D INDEPENDENT WKST  01/06/2020  . IR ANGIO INTRA EXTRACRAN SEL INTERNAL CAROTID BILAT MOD SED  01/06/2020  . IR ANGIO VERTEBRAL SEL VERTEBRAL UNI L MOD SED  01/06/2020  . IR ANGIOGRAM FOLLOW UP STUDY  01/06/2020  . IR ANGIOGRAM FOLLOW UP STUDY  01/06/2020  . IR ANGIOGRAM FOLLOW UP STUDY  01/06/2020  . IR ANGIOGRAM FOLLOW UP STUDY  01/06/2020  . IR ANGIOGRAM FOLLOW UP STUDY  01/06/2020  . IR ANGIOGRAM FOLLOW UP STUDY  01/06/2020  . IR ANGIOGRAM FOLLOW UP STUDY  01/06/2020  . IR ANGIOGRAM FOLLOW UP STUDY  01/06/2020  . IR ANGIOGRAM FOLLOW UP STUDY  01/06/2020  . IR ANGIOGRAM FOLLOW UP STUDY  01/06/2020  . IR NEURO EACH ADD'L AFTER BASIC UNI RIGHT (MS)  01/06/2020  . IR TRANSCATH/EMBOLIZ  01/06/2020  . RADIOLOGY WITH ANESTHESIA  N/A 01/06/2020   Procedure: IR WITH ANESTHESIA FOR ANEURYSM;  Surgeon: Consuella Lose, MD;  Location: [facility];  Service: Radiology;  Laterality: N/A;  . TUBAL LIGATION      There were no vitals filed for this visit.   Subjective Assessment - 03/21/20 1323    Subjective (husband) we didn't do much of the exercises - her sister was down and so she had some stimulation from her sister."    Currently in Pain? Yes    Pain Location --   RUE (hand, arm, shoulder)   Pain Orientation Right    Pain Descriptors / Indicators Aching    Pain Type Chronic pain                 ADULT SLP TREATMENT - 03/21/20 1324      General Information   Behavior/Cognition Alert;Cooperative;Impulsive;Pleasant mood      Treatment Provided   Treatment provided Cognitive-Linquistic      Cognitive-Linquistic Treatment   Treatment focused on Cognition    Skilled Treatment No overt s/sx aspiration PNA seen or observed today. Pt provided pt with detailed written directions - pt performed audibly due to rt hand paresis. Pt with consistent (at least 80% of the time) minimal-mod extra time and redirection to details in directions x2 (20%). Pt answered SLP with "  I would hand it to you" when asked what she would do if she were writing the answers down instead of "double check it". SLP provided pt another detailed directions sheet she was to answer audibly instead of written langauge. SLP reminded pt to take special care to only tell SLP what she would write down and pt req'd attention to detail with consistent mod extra time (suspect somehwat due mental fatigue) and redirection to details 1/9 questions. SLP turned on radio and played talk radio while SLP spelled out words. Pt told SLP words 97% success - pt stated that she was looking at SLP's eyes which was a strategy SLP told pt after her first and only error, 4 reps into the task out of 16 reps.       Assessment / Recommendations / Plan   Plan Continue with current  plan of care      Progression Toward Goals   Progression toward goals Progressing toward goals            SLP Education - 03/21/20 1407    Education Details Talk path 60 minutes a day - if you get tired take a 5 minute break and come back    Person(s) Educated Patient;Spouse    Methods Explanation    Comprehension Verbalized understanding            SLP Short Term Goals - 03/21/20 1415      SLP SHORT TERM GOAL #1   Title pt to complete cognitive linguistic testing    Period --   or 4 total sessions   Status Achieved      SLP SHORT TERM GOAL #2   Title pt will demo emergent awareness 100% with min complex therapy tasks x3 visits    Time 2    Period Weeks    Status On-going      SLP SHORT TERM GOAL #3   Title pt will demo appriopriate aspiration precuations with suggested POs x3 sessions    Time 2    Period Weeks    Status On-going      SLP SHORT TERM GOAL #4   Title pt will solve household probelms using functional solutions x3 sessions    Time 2    Period Weeks    Status On-going      SLP SHORT TERM GOAL #5   Title pt will demo selective attention (internal distraction) to a simple functional task for 10 minutes with rare min A back to task in 3 sessions    Time 2    Period Weeks    Status On-going            SLP Long Term Goals - 03/21/20 1415      SLP LONG TERM GOAL #1   Title pt will demo anticipatory awareness by double checking all work x5 sessions    Time 6    Period Weeks   or 17 total sessions, for all LTGs   Status On-going      SLP LONG TERM GOAL #2   Title pt will follow swallow precautions with POs with modified independence x3 sessions    Time 6    Period Weeks    Status On-going      SLP LONG TERM GOAL #3   Title pt will demo [facility] skills in solving mod complex problems in WNL amount of time with modified independence (double checking answers , etc)    Time 6    Period Weeks  Status On-going      SLP LONG TERM GOAL #4   Title pt  will demo altaernating attention in simple functional cognitive linguistic tasks in 3 sessions    Time 6    Status On-going            Plan - 03/21/20 1412    Clinical Impression Statement Erika Stanton cont with noted impulsivity and decr'd focused attention which are beginning to hinder success in therapy tasks. Awareness and simple executive function (problem solving) deficits also remain apparent. 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 She is leaning towards early retirement.    Speech Therapy Frequency 2x / week    Duration 8 weeks   or 17 total sessions   Treatment/Interventions Aspiration precaution training;Pharyngeal strengthening exercises;Compensatory techniques;Diet toleration management by SLP;Trials of upgraded texture/liquids;Cueing hierarchy;Cognitive reorganization;Internal/external aids;Patient/family education;SLP instruction and feedback;Functional tasks    Potential to Achieve Goals Good    Consulted and Agree with Plan of Care Patient           Patient will benefit from skilled therapeutic intervention in order to improve the following deficits and impairments:   Cognitive communication deficit  Dysphagia, oropharyngeal phase    Problem List Patient Active Problem List   Diagnosis Date Noted  . GIB (gastrointestinal bleeding) 02/28/2020  . Elevated BUN   . Prediabetes   . 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[place]) 01/20/2020  . Cerebral vasospasm   . Sinus tachycardia   . Dysphagia, post-stroke   . Thrombocytopenia ([place])   . Acute blood loss anemia   . Brain aneurysm   . ICH (intracerebral hemorrhage) ([place]) 01/05/2020    [facility] ,[place], [place]  03/21/2020, 2:17 PM  [facility] [address] [place], [place], [address] Phone: [phone]   Fax:  [phone]   Name: Erika Stanton MRN: [id number] Date of Birth: [birth year]

## 2020-03-21 NOTE — Therapy (Signed)
[facility] [address] [place], [place], [address] Phone: [phone]   Fax:  [phone]  Occupational Therapy Treatment  Patient Details  Name: Erika Stanton MRN: [id number] Date of Birth: 1955-11-14 Referring Provider (OT): Dr. Naaman Plummer   Encounter Date: 03/21/2020   OT End of Session - 03/21/20 1430    Visit Number 5    Number of Visits 25    Date for OT Re-Evaluation 05/25/20    Authorization Time Period State [employer]    OT Start Time 1230    OT Stop Time 1315    OT Time Calculation (min) 45 min    Activity Tolerance Patient tolerated treatment well    Behavior During Therapy [facility] for tasks assessed/performed           Past Medical History:  Diagnosis Date  . Melena     Past Surgical History:  Procedure Laterality Date  . CHOLECYSTECTOMY    . COLONOSCOPY  11/2017   at [facility]. no recurrent polyps.  suggest repeat surveillance study 11/2022.    Marland Kitchen COLONOSCOPY W/ POLYPECTOMY  06/2014   Dr Roxy Manns at [facility].  3 adenomatoous polyps, anal fissure.    . ESOPHAGOGASTRODUODENOSCOPY (EGD) WITH PROPOFOL N/A 01/27/2020   Procedure: ESOPHAGOGASTRODUODENOSCOPY (EGD) WITH PROPOFOL;  Surgeon: Mauri Pole, MD;  Location: [facility];  Service: Endoscopy;  Laterality: N/A;  . IR 3D INDEPENDENT WKST  01/06/2020  . IR ANGIO INTRA EXTRACRAN SEL INTERNAL CAROTID BILAT MOD SED  01/06/2020  . IR ANGIO VERTEBRAL SEL VERTEBRAL UNI L MOD SED  01/06/2020  . IR ANGIOGRAM FOLLOW UP STUDY  01/06/2020  . IR ANGIOGRAM FOLLOW UP STUDY  01/06/2020  . IR ANGIOGRAM FOLLOW UP STUDY  01/06/2020  . IR ANGIOGRAM FOLLOW UP STUDY  01/06/2020  . IR ANGIOGRAM FOLLOW UP STUDY  01/06/2020  . IR ANGIOGRAM FOLLOW UP STUDY  01/06/2020  . IR ANGIOGRAM FOLLOW UP STUDY  01/06/2020  . IR ANGIOGRAM FOLLOW UP STUDY  01/06/2020  . IR ANGIOGRAM FOLLOW UP STUDY  01/06/2020  . IR ANGIOGRAM FOLLOW UP STUDY  01/06/2020  . IR NEURO EACH ADD'L AFTER BASIC UNI RIGHT (MS)   01/06/2020  . IR TRANSCATH/EMBOLIZ  01/06/2020  . RADIOLOGY WITH ANESTHESIA N/A 01/06/2020   Procedure: IR WITH ANESTHESIA FOR ANEURYSM;  Surgeon: Consuella Lose, MD;  Location: [facility];  Service: Radiology;  Laterality: N/A;  . TUBAL LIGATION      There were no vitals filed for this visit.   Subjective Assessment - 03/21/20 1234    Subjective  Pt reports "I always have pain in my arm" about a 2 at rest and about a 4 with movement    Pertinent History ICH 01/06/20, Aneurysm. PMH: OA bilateral knees and hands    Currently in Pain? Yes    Pain Score 2    at rest, up to 38/45 w/ certain movements   Pain Orientation Right    Pain Descriptors / Indicators Aching    Pain Type Chronic pain;Neuropathic pain    Pain Onset 1 to 4 weeks ago    Pain Frequency Constant    Aggravating Factors  movement "messing with it"    Pain Relieving Factors not moving/rest          Reviewed ex's from HEP including self ROM into gravity, AA/ROM into gravity (using pool noodle), and wt bearing ex's (body on arm movements) - pt performed each with increased success.  Supine: attempted to get patient rolling onto  Rt side but unable to tolerate.  Seated: wt bearing over BUE's from sit to squat position followed by A/P wt shifts and lateral wt shifts (squat position over chair).  BUE wt bearing (arms to side) w/ lateral trunk flexion bilaterally.                         OT Short Term Goals - 03/21/20 1430      OT SHORT TERM GOAL #1   Title I with initialHEP.    Time 6    Period Weeks    Status Achieved    Target Date 04/13/20      OT SHORT TERM GOAL #2   Title Pt will perform bathing with min A.    Time 6    Period Weeks    Status New      OT SHORT TERM GOAL #3   Title Pt will donn shirt and pants with min A    Time 6    Period Weeks    Status New      OT SHORT TERM GOAL #4   Title Pt will demonstrate 30 A/ROM shoulder flexion for RUE with pain less than or equal to 4/10.     Time 6    Period Weeks    Status New      OT SHORT TERM GOAL #5   Title Pt will  demonstrate improved LUE fine motor coordination for ADLs as evidenced by decreasing 9 hole peg test score to 45 secs or less.    Time 6    Period Weeks    Status New      OT SHORT TERM GOAL #6   Title Pt/ caregiver will verbalize understanding of RUE positioning and edema control techniques to minimize pain and risk for injury    Time 6    Period Weeks    Status New      OT SHORT TERM GOAL #7   Title Pt will demonstrate at least 25% finger flexion/ extension in RUE in prep for functional use.    Time 6    Period Weeks    Status New             OT Long Term Goals - 03/02/20 1527      OT LONG TERM GOAL #1   Title Pt will perfrom  all basic ADLS with supervision    Time 12    Period Weeks    Status New      OT LONG TERM GOAL #2   Title Pt will demonstrate 45* RUE shoulder flexion in prep for functional reach with pain less than or equal to 3/10.    Time 12    Period Weeks    Status New      OT LONG TERM GOAL #3   Title Pt will use RUE as a stabilizer/ gross A at least 30 % of the time for ADLs/ IADLs.    Time 12    Period Weeks    Status New      OT LONG TERM GOAL #4   Title Pt will increase LUE grip strength to 25 lbs or greater for increased ease with ADLs.    Time 12    Period Weeks    Status New      OT LONG TERM GOAL #5   Title Pt will perform simple home management/ snack prep with min A.    Time 12    Period  Weeks    Status New      Long Term Additional Goals   Additional Long Term Goals Yes      OT LONG TERM GOAL #6   Title Pt will demonstrate ability to grasp/ release a cup 2/3 trials with RUE.    Time 12    Period Weeks    Status New      OT LONG TERM GOAL #7   Title Pt will safely navigate a busy environment and locate items with supervision and  90% or greater accuracy without bumping into items.    Time 12    Period Weeks    Status New      OT LONG TERM  GOAL #8   Title I with updated HEP.    Time 12    Period Weeks    Status New                 Plan - 03/21/20 1431    Clinical Impression Statement Pt continues to be limited by pain and fear of RUE pain, however pt tolerated ex's into gravity and simple body on arm movements today. Humeral head is anteriorly placed and scapula elevated.    OT Occupational Profile and History Detailed Assessment- Review of Records and additional review of physical, cognitive, psychosocial history related to current functional performance    Occupational performance deficits (Please refer to evaluation for details): ADL's;IADL's;Rest and Sleep;Work;Play;Leisure    Body Structure / Function / Physical Skills ADL;Balance;Coordination;Decreased knowledge of precautions;Decreased knowledge of use of DME;Dexterity;Edema;Mobility;Tone;Strength;IADL;Sensation;GMC;Gait;ROM;FMC;Flexibility;Pain;Vision;UE functional use;Endurance    Cognitive Skills Attention;Perception;Problem Solve;Safety Awareness;Thought    Rehab Potential Good    Clinical Decision Making Several treatment options, min-mod task modification necessary    Comorbidities Affecting Occupational Performance: None    Modification or Assistance to Complete Evaluation  Min-Moderate modification of tasks or assist with assess necessary to complete eval    OT Frequency 2x / week    OT Duration 12 weeks    OT Treatment/Interventions Self-care/ADL training;Ultrasound;Energy conservation;Visual/perceptual remediation/compensation;Patient/family education;DME and/or AE instruction;Aquatic Therapy;Paraffin;Gait Training;Passive range of motion;Balance training;Fluidtherapy;Electrical Stimulation;Functional Mobility Training;Splinting;Moist Heat;Therapeutic exercise;Manual Therapy;Cognitive remediation/compensation;Manual lymph drainage;Neuromuscular education;Coping strategies training    Plan continue to address RUE movement and sh pain, possible estim for  wrist and finger extension (work flex during "off times), simulate LE dressing with theraband    Consulted and Agree with Plan of Care Patient;Family member/caregiver    Family Member Consulted spouse present           Patient will benefit from skilled therapeutic intervention in order to improve the following deficits and impairments:   Body Structure / Function / Physical Skills: ADL, Balance, Coordination, Decreased knowledge of precautions, Decreased knowledge of use of DME, Dexterity, Edema, Mobility, Tone, Strength, IADL, Sensation, GMC, Gait, ROM, FMC, Flexibility, Pain, Vision, UE functional use, Endurance Cognitive Skills: Attention, Perception, Problem Solve, Safety Awareness, Thought     Visit Diagnosis: 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 GIB (gastrointestinal bleeding) 02/28/2020  . Elevated BUN   . Prediabetes   . 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[place]) 01/20/2020  . Cerebral vasospasm   . Sinus tachycardia   . Dysphagia, post-stroke   . Thrombocytopenia ([place])   . Acute blood loss anemia   . Brain aneurysm   . ICH (intracerebral hemorrhage) ([place]) 01/05/2020    Carey Bullocks, OTR/L  03/21/2020, 2:34 PM  [facility] [address] [place], [place], [address] Phone: [phone]   Fax:  [phone]  Name: Erika Stanton MRN: [id number] Date of Birth: 1955-09-15

## 2020-03-21 NOTE — Therapy (Signed)
[facility] [address] [place], [place], [address] Phone: [phone]   Fax:  [phone]  Physical Therapy Treatment  Patient Details  Name: Erika Stanton MRN: [id number] Date of Birth: [birth year] Referring Provider (PT): Alger Simons, MD   Encounter Date: 03/21/2020   PT End of Session - 03/21/20 1452    Visit Number 10    Number of Visits 21    Date for PT Re-Evaluation 04/25/20    Authorization Type BCBS State    PT Start Time 1400    PT Stop Time 1445    PT Time Calculation (min) 45 min    Equipment Utilized During Treatment Gait belt    Activity Tolerance Patient tolerated treatment well    Behavior During Therapy [facility] for tasks assessed/performed           Past Medical History:  Diagnosis Date  . Melena     Past Surgical History:  Procedure Laterality Date  . CHOLECYSTECTOMY    . COLONOSCOPY  11/2017   at [facility]. no recurrent polyps.  suggest repeat surveillance study 11/2022.    Marland Kitchen COLONOSCOPY W/ POLYPECTOMY  06/2014   Dr Roxy Manns at [facility]  3 adenomatoous polyps, anal fissure.    . ESOPHAGOGASTRODUODENOSCOPY (EGD) WITH PROPOFOL N/A 01/27/2020   Procedure: ESOPHAGOGASTRODUODENOSCOPY (EGD) WITH PROPOFOL;  Surgeon: Mauri Pole, MD;  Location: [facility];  Service: Endoscopy;  Laterality: N/A;  . IR 3D INDEPENDENT WKST  01/06/2020  . IR ANGIO INTRA EXTRACRAN SEL INTERNAL CAROTID BILAT MOD SED  01/06/2020  . IR ANGIO VERTEBRAL SEL VERTEBRAL UNI L MOD SED  01/06/2020  . IR ANGIOGRAM FOLLOW UP STUDY  01/06/2020  . IR ANGIOGRAM FOLLOW UP STUDY  01/06/2020  . IR ANGIOGRAM FOLLOW UP STUDY  01/06/2020  . IR ANGIOGRAM FOLLOW UP STUDY  01/06/2020  . IR ANGIOGRAM FOLLOW UP STUDY  01/06/2020  . IR ANGIOGRAM FOLLOW UP STUDY  01/06/2020  . IR ANGIOGRAM FOLLOW UP STUDY  01/06/2020  . IR ANGIOGRAM FOLLOW UP STUDY  01/06/2020  . IR ANGIOGRAM FOLLOW UP STUDY  01/06/2020  . IR ANGIOGRAM FOLLOW UP STUDY  01/06/2020  .  IR NEURO EACH ADD'L AFTER BASIC UNI RIGHT (MS)  01/06/2020  . IR TRANSCATH/EMBOLIZ  01/06/2020  . RADIOLOGY WITH ANESTHESIA N/A 01/06/2020   Procedure: IR WITH ANESTHESIA FOR ANEURYSM;  Surgeon: Consuella Lose, MD;  Location: [facility];  Service: Radiology;  Laterality: N/A;  . TUBAL LIGATION      There were no vitals filed for this visit.       [facility] PT Assessment - 03/21/20 1411      Transfers   Five time sit to stand comments  16.11 s with L UE      Berg Balance Test   Sit to Stand Able to stand without using hands and stabilize independently    Standing Unsupported Able to stand safely 2 minutes    Sitting with Back Unsupported but Feet Supported on Floor or Stool Able to sit safely and securely 2 minutes    Stand to Sit Sits safely with minimal use of hands    Transfers Able to transfer safely, minor use of hands    Standing Unsupported with Eyes Closed Able to stand 10 seconds safely    Standing Unsupported with Feet Together Able to place feet together independently and stand 1 minute safely    From Standing, Reach Forward with Outstretched Arm Can reach confidently >25 cm (10")  From Standing Position, Pick up Object from [place] to pick up shoe safely and easily    From Standing Position, Turn to Look Behind Over each Shoulder Looks behind from both sides and weight shifts well    Turn 360 Degrees Able to turn 360 degrees safely in 4 seconds or less    Standing Unsupported, Alternately Place Feet on Step/Stool Able to stand independently and safely and complete 8 steps in 20 seconds    Standing Unsupported, One Foot in [place] to plae foot ahead of the other independently and hold 30 seconds    Standing on One Leg Able to lift leg independently and hold 5-10 seconds    Total Score 54    Berg comment: 54/56                Berg balance scale and 5x sit to stand assessed STG and LTG reviewed Ramp and curb with SBA: 1x, cues to make sure she steps closer to  edge of step before stepping down curb to avoid taking longer step Stairs: rail on L going up, rail on R coming down with L UE use: 4x alternating steps Gait training: 1050' with >300 feet on grass, 10 feet of gravel. Cues to look down at her feet when going through gravel, grass, ramp, curb to improve balance. When walking on level ground, looking 15-20 feet ahead.                    PT Short Term Goals - 03/21/20 1444      PT SHORT TERM GOAL #1   Title Pt/spouse will be independent with initial HEP in order to indicate decreased fall risk and improved functional mobility.  (Target Date: 03/26/20)    Baseline performing most of HEP 03/21/20    Time 4    Period Weeks    Status Achieved    Target Date 03/26/20      PT SHORT TERM GOAL #2   Title Pt will complete BERG balance test and improve score by 6 points from baseline in order to indicate decreased fall risk.    Baseline 46/56 on 03/01/20; 54/56 03/21/20    Time 4    Period Weeks    Status Achieved      PT SHORT TERM GOAL #3   Title Pt will improve 5TSS to </=25 secs with single UE support in order to indicate dec fall risk and improved functional strength.    Baseline 16 sec with one UE (03/21/20)    Time 4    Period Weeks    Status Achieved      PT SHORT TERM GOAL #4   Title Pt will improve gait speed to >/=2.4 ft/sec with LRAD in order to indicate dec fall risk.    Time 4    Period Weeks    Status New      PT SHORT TERM GOAL #5   Title Pt will ambulate x 27' at S level with LRAD in simulated household environment in order to indicate improved independence in her home.    Baseline 1050' without AD    Time 4    Period Weeks    Status Achieved             PT Long Term Goals - 03/21/20 1439      PT LONG TERM GOAL #1   Title Pt/spouse will be independent with final HEP in order to indicate dec fall risk and improved functional  mobility.  (Target Date: 04/25/20)    Baseline Compliant with HEP 03/21/20     Time 8    Period Weeks    Status Achieved      PT LONG TERM GOAL #2   Title Pt will improve BERG balance test score by 10 points from baseline in order to indicate decreased fall risk.    Baseline 03/01/20 46/56; 54/56 on 03/21/20    Time 8    Period Weeks    Status Achieved      PT LONG TERM GOAL #3   Title Pt will perform 5TSS in </=20 secs without UE support in order to indicate improved functional strength    Baseline 16 seconds with L UE support 03/21/20    Time 8    Period Weeks    Status Achieved      PT LONG TERM GOAL #4   Title Pt will ambulate x 500' w/ LRAD at S level over unlevel paved and grassy surfaces, including ramp/curb in order to indicate safe community mobility.    Baseline 1050' including >300 feet of grass surface with SBA 03/21/20; we worked on ramp and curb also.    Time 8    Period Weeks    Status Achieved      PT LONG TERM GOAL #5   Title Pt will negotiate up/down 4 stairs with single L rail at mod I level in order to indicate safe home entry/exit.    Baseline Pt able to perform stairs with one rail and alternating steps.    Time 8    Period Weeks    Status Achieved                 Plan - 03/21/20 1419    Clinical Impression Statement demo improvement in Berg score from 46/56 to 54/56 which shows improved static balance and decreased fall risk. All long term goals and most of short term goals met (STG # 4 not assessed today). Pt is functional athome.    Personal Factors and Comorbidities Age;Comorbidity 1;Profession    Comorbidities B ICHs    Examination-Activity Limitations Bend;Carry;Lift;Locomotion Level;Reach Overhead;Squat;Stairs;Stand;Transfers    Examination-Participation Restrictions Cleaning;Community Activity;Driving;Occupation;Yard Work;Laundry;Meal Prep    Stability/Clinical Decision Making Evolving/Moderate complexity    Rehab Potential Good    PT Frequency 3x / week   then 2x/wk for 4 weeks   PT Duration 4 weeks   then 2x/wk for  4 weeks   PT Treatment/Interventions ADLs/Self Care Home Management;Aquatic Therapy;Electrical Stimulation;DME Instruction;Gait training;Stair training;Functional mobility training;Therapeutic activities;Therapeutic exercise;Balance training;Neuromuscular re-education;Cognitive remediation;Patient/family education;Orthotic Fit/Training;Manual techniques;Passive range of motion;Dry needling;Energy conservation;Vestibular;Visual/perceptual remediation/compensation    PT Next Visit Plan Assess STG # 4 next session, consider Discharge in 1-2 sessions from PT (discharge planning initiated and patient and husband agree with potential discharge in 1-2 sessions)    Consulted and Agree with Plan of Care Patient;Family member/caregiver           Patient will benefit from skilled therapeutic intervention in order to improve the following deficits and impairments:  Abnormal gait, Decreased activity tolerance, Decreased balance, Decreased cognition, Decreased endurance, Decreased knowledge of use of DME, Decreased mobility, Decreased strength, Impaired perceived functional ability, Impaired flexibility, Impaired sensation, Postural dysfunction, Improper body mechanics, Impaired vision/preception, Impaired UE functional use, Impaired tone  Visit Diagnosis: Muscle weakness (generalized)  Other abnormalities of gait and mobility  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 GIB (gastrointestinal bleeding) 02/28/2020  . Elevated BUN   . Prediabetes   . 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[place]) 01/20/2020  . Cerebral vasospasm   . Sinus tachycardia   . Dysphagia, post-stroke   . Thrombocytopenia ([place])   . Acute blood loss anemia   . Brain aneurysm   . ICH (intracerebral hemorrhage) ([place]) 01/05/2020    Kerrie Pleasure, PT 03/21/2020, 2:53 PM  [facility] [address] [place], [place], [address] Phone: [phone]   Fax:  [phone]  Name: SAMMIE SCHERMERHORN MRN: [id number] Date of Birth: 06/01/1955

## 2020-03-22 ENCOUNTER — Encounter: Payer: Self-pay | Admitting: Physical Medicine and Rehabilitation

## 2020-03-22 ENCOUNTER — Other Ambulatory Visit: Payer: Self-pay

## 2020-03-22 ENCOUNTER — Encounter
Payer: BC Managed Care – PPO | Attending: Physical Medicine and Rehabilitation | Admitting: Physical Medicine and Rehabilitation

## 2020-03-22 VITALS — BP 144/87 | HR 71 | Temp 98.8°F | Ht 66.0 in | Wt 177.8 lb

## 2020-03-22 DIAGNOSIS — M7501 Adhesive capsulitis of right shoulder: Secondary | ICD-10-CM | POA: Diagnosis not present

## 2020-03-22 DIAGNOSIS — I610 Nontraumatic intracerebral hemorrhage in hemisphere, subcortical: Secondary | ICD-10-CM | POA: Diagnosis not present

## 2020-03-22 DIAGNOSIS — I607 Nontraumatic subarachnoid hemorrhage from unspecified intracranial artery: Secondary | ICD-10-CM | POA: Diagnosis present

## 2020-03-22 DIAGNOSIS — R7303 Prediabetes: Secondary | ICD-10-CM | POA: Diagnosis present

## 2020-03-22 MED ORDER — CLOPIDOGREL BISULFATE 75 MG PO TABS: 75.0000 mg | ORAL_TABLET | Freq: Every day | ORAL | 2 refills | Status: AC

## 2020-03-22 MED ORDER — GABAPENTIN 300 MG PO CAPS: 300.0000 mg | ORAL_CAPSULE | Freq: Three times a day (TID) | ORAL | 0 refills | Status: DC

## 2020-03-22 MED ORDER — ASPIRIN 325 MG PO TABS: 325.0000 mg | ORAL_TABLET | Freq: Every day | ORAL | 3 refills | Status: AC

## 2020-03-22 NOTE — Progress Notes (Signed)
Subjective:    Patient ID: Erika Stanton, female    DOB: 1956/02/10, 64 y.o.   MRN: [id number]  HPI  Erika Stanton is a 64 year old woman who presents for transitional care follow-up after CVA.  She has been having right shoulder pain that has been inhibiting her progress. She is currently taking Tylenol twice per day. Current pain is 2/10. She has never tried blue emu oil. She applies voltaren gel to her knee and did not know she could use it in her shoulder well.   She works at [employer] in [employer]- she has to keep up with grant money, needs focus, and she can't type now because of her right hand weakness and pain.  She also has right basilar arthritis.   She has noted issues with focus, patience, attention, vision. Things are blurrier than they were.  Her PT is getting ready to end as she is doing very well with strength in lower extremities. She is doing very well with SLP, reading, memory, comprehension.   Pain Inventory Average Pain 2 Pain Right Now 2 My pain is aching  LOCATION OF PAIN  Shoulder, elbow, wrist, hand, fingers  BOWEL Number of stools per week: 7 Oral laxative use No  Type of laxative na Enema or suppository use No  History of colostomy No  Incontinent No   BLADDER Normal In and out cath, frequency na Able to self cath No  Bladder incontinence No  Frequent urination No  Leakage with coughing No  Difficulty starting stream No  Incomplete bladder emptying No    Mobility walk without assistance how many minutes can you walk? 5-10 ability to climb steps?  yes do you drive?  no  Function disabled: date disabled 01/05/20 I need assistance with the following:  dressing, bathing, toileting, meal prep, household duties and shopping  Neuro/Psych trouble walking  Prior Studies TC appt  Physicians involved in your care TC appt   Family History  Problem Relation Age of Onset  . Cancer Mother   . Congestive Heart Failure Mother   . Lung cancer  Father   . Lung cancer Sister    Social History   Socioeconomic History  . Marital status: Married    Spouse name: Not on file  . Number of children: Not on file  . Years of education: Not on file  . Highest education level: Not on file  Occupational History  . Not on file  Tobacco Use  . Smoking status: Former Research scientist (life sciences)  . Smokeless tobacco: Never Used  Substance and Sexual Activity  . Alcohol use: Not on file  . Drug use: Not on file  . Sexual activity: Not on file  Other Topics Concern  . Not on file  Social History Narrative  . Not on file   Social Determinants of Health   Financial Resource Strain:   . Difficulty of Paying Living Expenses: Not on file  Food Insecurity:   . Worried About Charity fundraiser in the Last Year: Not on file  . Ran Out of Food in the Last Year: Not on file  Transportation Needs:   . Lack of Transportation (Medical): Not on file  . Lack of Transportation (Non-Medical): Not on file  Physical Activity:   . Days of Exercise per Week: Not on file  . Minutes of Exercise per Session: Not on file  Stress:   . Feeling of Stress : Not on file  Social Connections:   . Frequency  of Communication with Friends and Family: Not on file  . Frequency of Social Gatherings with Friends and Family: Not on file  . Attends Religious Services: Not on file  . Active Member of Clubs or Organizations: Not on file  . Attends Archivist Meetings: Not on file  . Marital Status: Not on file   Past Surgical History:  Procedure Laterality Date  . CHOLECYSTECTOMY    . COLONOSCOPY  11/2017   at [facility]. no recurrent polyps.  suggest repeat surveillance study 11/2022.    Marland Kitchen COLONOSCOPY W/ POLYPECTOMY  06/2014   Dr Roxy Manns at [facility].  3 adenomatoous polyps, anal fissure.    . ESOPHAGOGASTRODUODENOSCOPY (EGD) WITH PROPOFOL N/A 01/27/2020   Procedure: ESOPHAGOGASTRODUODENOSCOPY (EGD) WITH PROPOFOL;  Surgeon: Mauri Pole, MD;  Location: [facility];  Service:  Endoscopy;  Laterality: N/A;  . IR 3D INDEPENDENT WKST  01/06/2020  . IR ANGIO INTRA EXTRACRAN SEL INTERNAL CAROTID BILAT MOD SED  01/06/2020  . IR ANGIO VERTEBRAL SEL VERTEBRAL UNI L MOD SED  01/06/2020  . IR ANGIOGRAM FOLLOW UP STUDY  01/06/2020  . IR ANGIOGRAM FOLLOW UP STUDY  01/06/2020  . IR ANGIOGRAM FOLLOW UP STUDY  01/06/2020  . IR ANGIOGRAM FOLLOW UP STUDY  01/06/2020  . IR ANGIOGRAM FOLLOW UP STUDY  01/06/2020  . IR ANGIOGRAM FOLLOW UP STUDY  01/06/2020  . IR ANGIOGRAM FOLLOW UP STUDY  01/06/2020  . IR ANGIOGRAM FOLLOW UP STUDY  01/06/2020  . IR ANGIOGRAM FOLLOW UP STUDY  01/06/2020  . IR ANGIOGRAM FOLLOW UP STUDY  01/06/2020  . IR NEURO EACH ADD'L AFTER BASIC UNI RIGHT (MS)  01/06/2020  . IR TRANSCATH/EMBOLIZ  01/06/2020  . RADIOLOGY WITH ANESTHESIA N/A 01/06/2020   Procedure: IR WITH ANESTHESIA FOR ANEURYSM;  Surgeon: Consuella Lose, MD;  Location: [facility];  Service: Radiology;  Laterality: N/A;  . TUBAL LIGATION     Past Medical History:  Diagnosis Date  . Melena    BP (!) 144/87   Pulse 71   Temp 98.8 F (37.1 C)   Ht 5\' 6"  (1.676 m)   Wt 177 lb 12.8 oz (80.6 kg)   SpO2 95%   BMI 28.70 kg/m   Opioid Risk Score:   Fall Risk Score:  `1  Depression screen PHQ 2/9  No flowsheet data found.    Review of Systems  Musculoskeletal: Positive for gait problem.       Shoulder pain Elbow pain Wrist pain  hand pain Fingers pain   All other systems reviewed and are negative.      Objective:   Physical Exam Gen: no distress, normal appearing HEENT: oral mucosa pink and moist, NCAT Cardio: Reg rate Chest: normal effort, normal rate of breathing Abd: soft, non-distended Ext: no edema Skin: intact Neuro: 2+ bicep, triceps/ 1+ WE, trace finger flex/ext RUE , 4/5 RLE,  LUE 4-/5 , LLE 4/5. Right arm is less sensitive to touch with decreased LT. Musculoskeletal: Right arm with tenderness over right biceps and deltoid.  Psych: pleasant, normal affect    Assessment & Plan:   Erika Stanton is a 64 year old woman who presents for transitional care follow-up after ICH and brain aneurysm.  1) ICH, brain aneurysm:  -Has had follow-up with neurology. -Needs refill for aspirin, Plavix- I have sent today.   2) Impaired mobility, ADLs, attention -Currently she is walking much better. Still having difficulty with longer distances (has history of OA and this was an issue prior to her  ICH). I have provided her a handicap placard.  -Filled out disability paperwork. Called husband back after appointment to let him know it was ready and he will pick it up tomorrow as he would like to have a copy. 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  -Continue home PT, OT, SLP. She has almost completed PT as ability to ambulate has improved. OT should be increased to TID- asked husband to text me OT's phone number so I can communicate this to them.  3) Right shoulder pain- primarily over the biceps and deltoid, anteriorly postured -Right steroid injection next appointment as is significantly limiting therapy --Discussed [employer] PNS system as an option of pain treatment via neuromodulation. Provided following link for patient to learn more about the system: https://www.sprtherapeutics.com/. She does not prefer this option currently -Apply topical blue emu oil -OTC 4% lidocaine patch -Increase gabapentin to 300mg  TID as this is helping with the pain.   4) Right basilar OA:  -Can perform lidocaine and/or injection next visit -Continue bracinc -Can apply blue emu oil here as well.   5) Low K+ -Repeat CMP at follow-up with PCP in 2 days  6) Prediabetes: -Repeat HgbA1c at follow-up with PCP in 2 days.   7) Vaccines: Advised that she is ok to get flu vaccine and COVID booster- 2 weeks apart, latter one month after 2nd vaccination.   8) Alcohol use: She enjoyed drinking beers- sometimes multiple/day. Advised that this can  affect cognition and would recommend no alcohol while she continues to have cognitive deficits.  9) Blood in stool: No longer having, please contact me or PCP if recurs. Has GI f/u.  Transitional care appointment was necessary given severe should pain during therapy that limits sessions, patient ran out of hospital medications, patient has multiple health issues regarding which she and her husband had questions/confusion.

## 2020-03-22 NOTE — Patient Instructions (Addendum)
Lidocaine patch 4% (over the counter)- call me if this is not enough and I will send 5%  Apply Blue Emu oil typically  Can increase Tylenol to up to 6 times per day  CMP, CBC, Vitamin D, Magnesium, Hemoglobin A1C

## 2020-03-23 ENCOUNTER — Ambulatory Visit: Payer: BC Managed Care – PPO | Admitting: Speech Pathology

## 2020-03-23 ENCOUNTER — Ambulatory Visit: Payer: BC Managed Care – PPO

## 2020-03-23 ENCOUNTER — Encounter: Payer: Self-pay | Admitting: Occupational Therapy

## 2020-03-23 ENCOUNTER — Encounter: Payer: Self-pay | Admitting: Speech Pathology

## 2020-03-23 ENCOUNTER — Ambulatory Visit: Payer: BC Managed Care – PPO | Admitting: Occupational Therapy

## 2020-03-23 DIAGNOSIS — R2681 Unsteadiness on feet: Secondary | ICD-10-CM

## 2020-03-23 DIAGNOSIS — R2689 Other abnormalities of gait and mobility: Secondary | ICD-10-CM

## 2020-03-23 DIAGNOSIS — R41844 Frontal lobe and executive function deficit: Secondary | ICD-10-CM

## 2020-03-23 DIAGNOSIS — R1312 Dysphagia, oropharyngeal phase: Secondary | ICD-10-CM

## 2020-03-23 DIAGNOSIS — M6281 Muscle weakness (generalized): Secondary | ICD-10-CM

## 2020-03-23 DIAGNOSIS — I69159 Hemiplegia and hemiparesis following nontraumatic intracerebral hemorrhage affecting unspecified side: Secondary | ICD-10-CM

## 2020-03-23 DIAGNOSIS — R41840 Attention and concentration deficit: Secondary | ICD-10-CM

## 2020-03-23 DIAGNOSIS — M25511 Pain in right shoulder: Secondary | ICD-10-CM

## 2020-03-23 DIAGNOSIS — M79641 Pain in right hand: Secondary | ICD-10-CM

## 2020-03-23 DIAGNOSIS — R278 Other lack of coordination: Secondary | ICD-10-CM

## 2020-03-23 DIAGNOSIS — R41841 Cognitive communication deficit: Secondary | ICD-10-CM

## 2020-03-23 NOTE — Patient Instructions (Addendum)
  Sort through and respond to emails - with no distractions  Ask OT about a mouse  Use the voice memo to send texts so you don't have to worry about spelling the wrong word or auto-correct  Great job starting to do some household chores: loading dishwasher, folding, unloading silver, putting clothes in wash etc  Erika Stanton needs to be responsible for brining in her folder and remembering it when you leave the house

## 2020-03-23 NOTE — Therapy (Signed)
[facility] [address] [place], [place], [address] Phone: [phone]   Fax:  [phone]  Physical Therapy Treatment  Patient Details  Name: ANNABELLA ELFORD MRN: [id number] Date of Birth: [birth year] Referring Provider (PT): Alger Simons, MD   Encounter Date: 03/23/2020   PT End of Session - 03/23/20 1321    Visit Number 11    Number of Visits 21    Date for PT Re-Evaluation 04/25/20    Authorization Type [employer]BS State    PT Start Time 1321    PT Stop Time 1400    PT Time Calculation (min) 39 min    Equipment Utilized During Treatment Gait belt    Activity Tolerance Patient tolerated treatment well    Behavior During Therapy [facility] for tasks assessed/performed           Past Medical History:  Diagnosis Date  . Melena     Past Surgical History:  Procedure Laterality Date  . CHOLECYSTECTOMY    . COLONOSCOPY  11/2017   at [facility]. no recurrent polyps.  suggest repeat surveillance study 11/2022.    Marland Kitchen COLONOSCOPY W/ POLYPECTOMY  06/2014   Dr Roxy Manns at [facility].  3 adenomatoous polyps, anal fissure.    . ESOPHAGOGASTRODUODENOSCOPY (EGD) WITH PROPOFOL N/A 01/27/2020   Procedure: ESOPHAGOGASTRODUODENOSCOPY (EGD) WITH PROPOFOL;  Surgeon: Mauri Pole, MD;  Location: [facility];  Service: Endoscopy;  Laterality: N/A;  . IR 3D INDEPENDENT WKST  01/06/2020  . IR ANGIO INTRA EXTRACRAN SEL INTERNAL CAROTID BILAT MOD SED  01/06/2020  . IR ANGIO VERTEBRAL SEL VERTEBRAL UNI L MOD SED  01/06/2020  . IR ANGIOGRAM FOLLOW UP STUDY  01/06/2020  . IR ANGIOGRAM FOLLOW UP STUDY  01/06/2020  . IR ANGIOGRAM FOLLOW UP STUDY  01/06/2020  . IR ANGIOGRAM FOLLOW UP STUDY  01/06/2020  . IR ANGIOGRAM FOLLOW UP STUDY  01/06/2020  . IR ANGIOGRAM FOLLOW UP STUDY  01/06/2020  . IR ANGIOGRAM FOLLOW UP STUDY  01/06/2020  . IR ANGIOGRAM FOLLOW UP STUDY  01/06/2020  . IR ANGIOGRAM FOLLOW UP STUDY  01/06/2020  . IR ANGIOGRAM FOLLOW UP STUDY  01/06/2020  .  IR NEURO EACH ADD'L AFTER BASIC UNI RIGHT (MS)  01/06/2020  . IR TRANSCATH/EMBOLIZ  01/06/2020  . RADIOLOGY WITH ANESTHESIA N/A 01/06/2020   Procedure: IR WITH ANESTHESIA FOR ANEURYSM;  Surgeon: Consuella Lose, MD;  Location: [facility];  Service: Radiology;  Laterality: N/A;  . TUBAL LIGATION      There were no vitals filed for this visit.   Subjective Assessment - 03/23/20 1321    Subjective Pt just finished ST. Had to use restroom first. Pt reports that she is still challenged with getting up from low chair. She also feels a little unsteady walking outside and needs husband with her.    Patient is accompained by: Family member   husband Ronalee Belts   Limitations Walking;Standing;House hold activities    How long can you walk comfortably? Did a little walking yesterday up/down the hallway    Patient Stated Goals Use my right hand like I used to.    Pain Onset 1 to 4 weeks ago              [facility] PT Assessment - 03/23/20 1325      Functional Gait  Assessment   Gait assessed  Yes    Gait Level Surface Walks 20 ft in less than 5.5 sec, no assistive devices, good speed, no evidence for imbalance,  normal gait pattern, deviates no more than 6 in outside of the 12 in walkway width.    Change in Gait Speed Able to change speed, demonstrates mild gait deviations, deviates 6-10 in outside of the 12 in walkway width, or no gait deviations, unable to achieve a major change in velocity, or uses a change in velocity, or uses an assistive device.    Gait with Horizontal Head Turns Performs head turns smoothly with slight change in gait velocity (eg, minor disruption to smooth gait path), deviates 6-10 in outside 12 in walkway width, or uses an assistive device.    Gait with Vertical Head Turns Performs task with slight change in gait velocity (eg, minor disruption to smooth gait path), deviates 6 - 10 in outside 12 in walkway width or uses assistive device    Gait and Pivot Turn Pivot turns safely within 3 sec  and stops quickly with no loss of balance.    Step Over Obstacle Is able to step over one shoe box (4.5 in total height) without changing gait speed. No evidence of imbalance.    Gait with Narrow Base of Support Ambulates 4-7 steps.   6 steps before staggering   Gait with Eyes Closed Walks 20 ft, uses assistive device, slower speed, mild gait deviations, deviates 6-10 in outside 12 in walkway width. Ambulates 20 ft in less than 9 sec but greater than 7 sec.    Ambulating Backwards Walks 20 ft, slow speed, abnormal gait pattern, evidence for imbalance, deviates 10-15 in outside 12 in walkway width.    Steps Two feet to a stair, must use rail.    Total Score 19                         [facility] Adult PT Treatment/Exercise - 03/23/20 1325      Ambulation/Gait   Ambulation/Gait Yes    Ambulation/Gait Assistance 5: Supervision    Ambulation/Gait Assistance Details Pt was cued to relax right arm. Denied any pain with walking. 3/10 RPE after. Pt reports she feels more tired from the heat outside.     Ambulation Distance (Feet) 1000 Feet    Assistive device None    Gait Pattern Step-through pattern;Decreased arm swing - right    Ambulation Surface Level;Unlevel;Indoor;Outdoor;Paved    Gait velocity 10.2 sec=3.21 ft/ sec    Stairs Yes    Stairs Assistance 6: Modified independent (Device/Increase time)    Stairs Assistance Details (indicate cue type and reason) Pt reports that she needs the rail due to her knees    Stair Management Technique One rail Left;Step to pattern;Alternating pattern    Number of Stairs 4      Neuro Re-ed    Neuro Re-ed Details  Gait over obstacle course: stepping over 5 cones with tapping with each foot prior then marching over blue mat x 4 bouts CGA. Pt was cued to take her time. She had decreased stance time on RLE. Pt performed seated on blue physioball: marching 10 x 2 BLE with verbal and tactile cues to engage core and sit up tall for improved stability. PT  provided CGA to stabilize ball next to mat. Pt more challenged with lifting LLE.                  PT Education - 03/23/20 1409    Education Details Discussed results of FGA testing indicating moderate fall risk. Plan for ongoing PT to further progress dynamic gait/balance.  Person(s) Educated Patient;Spouse    Methods Explanation    Comprehension Verbalized understanding            PT Short Term Goals - 03/23/20 1335      PT SHORT TERM GOAL #1   Title Pt/spouse will be independent with initial HEP in order to indicate decreased fall risk and improved functional mobility.  (Target Date: 03/26/20)    Baseline performing most of HEP 03/21/20    Time 4    Period Weeks    Status Achieved    Target Date 03/26/20      PT SHORT TERM GOAL #2   Title Pt will complete BERG balance test and improve score by 6 points from baseline in order to indicate decreased fall risk.    Baseline 46/56 on 03/01/20; 54/56 03/21/20    Time 4    Period Weeks    Status Achieved      PT SHORT TERM GOAL #3   Title Pt will improve 5TSS to </=25 secs with single UE support in order to indicate dec fall risk and improved functional strength.    Baseline 16 sec with one UE (03/21/20)    Time 4    Period Weeks    Status Achieved      PT SHORT TERM GOAL #4   Title Pt will improve gait speed to >/=2.4 ft/sec with LRAD in order to indicate dec fall risk.    Baseline 03/23/20 3.21 ft/sec    Time 4    Period Weeks    Status Achieved      PT SHORT TERM GOAL #5   Title Pt will ambulate x 78' at S level with LRAD in simulated household environment in order to indicate improved independence in her home.    Baseline 1050' without AD    Time 4    Period Weeks    Status Achieved             PT Long Term Goals - 03/23/20 1418      PT LONG TERM GOAL #1   Title Pt/spouse will be independent with final HEP in order to indicate dec fall risk and improved functional mobility.  (Target Date: 04/25/20)     Baseline Compliant with HEP 03/21/20    Time 8    Period Weeks    Status Achieved      PT LONG TERM GOAL #2   Title Pt will improve BERG balance test score by 10 points from baseline in order to indicate decreased fall risk.    Baseline 03/01/20 46/56; 54/56 on 03/21/20    Time 8    Period Weeks    Status Achieved      PT LONG TERM GOAL #3   Title Pt will perform 5TSS in </=20 secs without UE support in order to indicate improved functional strength    Baseline 16 seconds with L UE support 03/21/20    Time 8    Period Weeks    Status Achieved      PT LONG TERM GOAL #4   Title Pt will ambulate x 500' w/ LRAD at S level over unlevel paved and grassy surfaces, including ramp/curb in order to indicate safe community mobility.    Baseline 1050' including >300 feet of grass surface with SBA 03/21/20; we worked on ramp and curb also.    Time 8    Period Weeks    Status Achieved      PT LONG TERM GOAL #5  Title Pt will negotiate up/down 4 stairs with single L rail at mod I level in order to indicate safe home entry/exit.    Baseline Pt able to perform stairs with one rail and alternating steps.    Time 8    Period Weeks    Status Achieved           Updated LTGs:  PT Long Term Goals - 03/23/20 1422      PT LONG TERM GOAL #1   Title Pt will be independent with progressive HEP for balance and strengthening to continue gains on own.    Time 8    Period Weeks    Status Revised    Target Date 04/25/20      PT LONG TERM GOAL #2   Title Pt will increase FGA from 19/30 to >24/30 for improved balance and gait to decrease fall risk.    Baseline 03/23/20 19/30    Time 8    Period Weeks    Status New    Target Date 04/25/20      PT LONG TERM GOAL #3   Title Pt will perform 5TSS in </=13 secs without UE support in order to indicate improved functional strength    Baseline 16 seconds with L UE support 03/21/20    Time 8    Period Weeks    Status Revised    Target Date 04/25/20       PT LONG TERM GOAL #4   Title Pt will ambulate >1000' on varied surfaces independently with no LOB for improved community ambulation without AD.    Baseline 1050' including >300 feet of grass surface with SBA 03/21/20; we worked on ramp and curb also.    Time 8    Period Weeks    Status Revised    Target Date 04/25/20                Plan - 03/23/20 1409    Clinical Impression Statement PT assessed gait speed and pt improved to 3.63ft/sec which is showing improved safety for community ambulation but decreased compared to norms still. She was assessed with FGA to further progress gait/balance and scored 19/30 indicating moderate fall risk. Pt still at supervision level for gait on varied surfaces. Pt also demonstrates decreased core stability on physioball. PT updated LTGs to be able to further progress pt to maximize function.    Personal Factors and Comorbidities Age;Comorbidity 1;Profession    Comorbidities B ICHs    Examination-Activity Limitations Bend;Carry;Lift;Locomotion Level;Reach Overhead;Squat;Stairs;Stand;Transfers    Examination-Participation Restrictions Cleaning;Community Activity;Driving;Occupation;Yard Work;Laundry;Meal Prep    Stability/Clinical Decision Making Evolving/Moderate complexity    Rehab Potential Good    PT Frequency 3x / week   then 2x/wk for 4 weeks   PT Duration 4 weeks   then 2x/wk for 4 weeks   PT Treatment/Interventions ADLs/Self Care Home Management;Aquatic Therapy;Electrical Stimulation;DME Instruction;Gait training;Stair training;Functional mobility training;Therapeutic activities;Therapeutic exercise;Balance training;Neuromuscular re-education;Cognitive remediation;Patient/family education;Orthotic Fit/Training;Manual techniques;Passive range of motion;Dry needling;Energy conservation;Vestibular;Visual/perceptual remediation/compensation    PT Next Visit Plan Continue with dynamic gait and balance activities. Activities to try to incorporate more  core and shoulder activation as able. Can try seated on physioball or tall kneeling again.    Consulted and Agree with Plan of Care Patient;Family member/caregiver           Patient will benefit from skilled therapeutic intervention in order to improve the following deficits and impairments:  Abnormal gait, Decreased activity tolerance, Decreased balance, Decreased cognition, Decreased endurance, Decreased knowledge  of use of DME, Decreased mobility, Decreased strength, Impaired perceived functional ability, Impaired flexibility, Impaired sensation, Postural dysfunction, Improper body mechanics, Impaired vision/preception, Impaired UE functional use, Impaired tone  Visit Diagnosis: Other abnormalities of gait and mobility  Muscle weakness (generalized)  Unsteadiness on feet     Problem List Patient Active Problem List   Diagnosis Date Noted  . GIB (gastrointestinal bleeding) 02/28/2020  . Elevated BUN   . Prediabetes   . 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[place]) 01/20/2020  . Cerebral vasospasm   . Sinus tachycardia   . Dysphagia, post-stroke   . Thrombocytopenia ([place])   . Acute blood loss anemia   . Brain aneurysm   . ICH (intracerebral hemorrhage) ([place]) 01/05/2020    Electa Sniff, PT, DPT, NCS 03/23/2020, 2:20 PM  [facility] [address] [place], [place], [address] Phone: [phone]   Fax:  [phone]  Name: TASNIM BALENTINE MRN: [id number] Date of Birth: 21-Oct-1955

## 2020-03-23 NOTE — Therapy (Signed)
[facility] [address] [place], [place], [address] Phone: [phone]   Fax:  [phone]  Speech Language Pathology Treatment  Patient Details  Name: Erika Stanton MRN: [id number] Date of Birth: [birth year] Referring Provider (SLP): Alger Simons, MD   Encounter Date: 03/23/2020   End of Session - 03/23/20 1539    Visit Number 7    Number of Visits 17    Date for SLP Re-Evaluation 05/27/20    SLP Start Time 1232    SLP Stop Time  1315    SLP Time Calculation (min) 43 min    Activity Tolerance Patient tolerated treatment well           Past Medical History:  Diagnosis Date  . Melena     Past Surgical History:  Procedure Laterality Date  . CHOLECYSTECTOMY    . COLONOSCOPY  11/2017   at [facility]. no recurrent polyps.  suggest repeat surveillance study 11/2022.    Marland Kitchen COLONOSCOPY W/ POLYPECTOMY  06/2014   Dr Roxy Manns at [facility].  3 adenomatoous polyps, anal fissure.    . ESOPHAGOGASTRODUODENOSCOPY (EGD) WITH PROPOFOL N/A 01/27/2020   Procedure: ESOPHAGOGASTRODUODENOSCOPY (EGD) WITH PROPOFOL;  Surgeon: Mauri Pole, MD;  Location: [facility];  Service: Endoscopy;  Laterality: N/A;  . IR 3D INDEPENDENT WKST  01/06/2020  . IR ANGIO INTRA EXTRACRAN SEL INTERNAL CAROTID BILAT MOD SED  01/06/2020  . IR ANGIO VERTEBRAL SEL VERTEBRAL UNI L MOD SED  01/06/2020  . IR ANGIOGRAM FOLLOW UP STUDY  01/06/2020  . IR ANGIOGRAM FOLLOW UP STUDY  01/06/2020  . IR ANGIOGRAM FOLLOW UP STUDY  01/06/2020  . IR ANGIOGRAM FOLLOW UP STUDY  01/06/2020  . IR ANGIOGRAM FOLLOW UP STUDY  01/06/2020  . IR ANGIOGRAM FOLLOW UP STUDY  01/06/2020  . IR ANGIOGRAM FOLLOW UP STUDY  01/06/2020  . IR ANGIOGRAM FOLLOW UP STUDY  01/06/2020  . IR ANGIOGRAM FOLLOW UP STUDY  01/06/2020  . IR ANGIOGRAM FOLLOW UP STUDY  01/06/2020  . IR NEURO EACH ADD'L AFTER BASIC UNI RIGHT (MS)  01/06/2020  . IR TRANSCATH/EMBOLIZ  01/06/2020  . RADIOLOGY WITH ANESTHESIA N/A  01/06/2020   Procedure: IR WITH ANESTHESIA FOR ANEURYSM;  Surgeon: Consuella Lose, MD;  Location: [facility];  Service: Radiology;  Laterality: N/A;  . TUBAL LIGATION      There were no vitals filed for this visit.   Subjective Assessment - 03/23/20 1234    Subjective "[employer] works my hand and my brain"    Currently in Pain? Yes    Pain Score 2     Pain Location Hand    Pain Orientation Right    Pain Descriptors / Indicators Aching    Pain Type Chronic pain    Pain Onset 1 to 4 weeks ago    Pain Frequency Constant    Aggravating Factors  movement                 ADULT SLP TREATMENT - 03/23/20 1235      General Information   Behavior/Cognition Alert;Cooperative;Impulsive;Pleasant mood      Treatment Provided   Treatment provided Cognitive-Linquistic      Dysphagia Treatment   Temperature Spikes Noted No      Cognitive-Linquistic Treatment   Treatment focused on Cognition;Patient/family/caregiver education    Skilled Treatment Pt forgot her nuts, will bring them next time. Denies swallowing difficulties since past visit. Pt has started doing some chores, loading the dishwasher. Encouraged her  to continue this. She is looking at her calendar the night before, then in the am to recall and plan for appointments. Adelei sustained attention to cogntivie task with mod I. Alternating attention to functional math task with distraction of conversation with usual min to mod A to attend to details and visual cues to attend to and complete mental math.       Assessment / Recommendations / Plan   Plan Continue with current plan of care      Progression Toward Goals   Progression toward goals Progressing toward goals            SLP Education - 03/23/20 1537    Education Details voice memo texting, keep emails cleaned up,    Person(s) Educated Patient;Spouse    Methods Explanation    Comprehension Verbalized understanding            SLP Short Term Goals - 03/23/20  1538      SLP SHORT TERM GOAL #1   Title pt to complete cognitive linguistic testing    Period --   or 4 total sessions   Status Achieved      SLP SHORT TERM GOAL #2   Title pt will demo emergent awareness 100% with min complex therapy tasks x3 visits    Time 2    Period Weeks    Status On-going      SLP SHORT TERM GOAL #3   Title pt will demo appriopriate aspiration precuations with suggested POs x3 sessions    Time 2    Period Weeks    Status On-going      SLP SHORT TERM GOAL #4   Title pt will solve household probelms using functional solutions x3 sessions    Time 2    Period Weeks    Status On-going      SLP SHORT TERM GOAL #5   Title pt will demo selective attention (internal distraction) to a simple functional task for 10 minutes with rare min A back to task in 3 sessions    Time 2    Period Weeks    Status On-going            SLP Long Term Goals - 03/23/20 1539      SLP LONG TERM GOAL #1   Title pt will demo anticipatory awareness by double checking all work x5 sessions    Time 6    Period Weeks   or 17 total sessions, for all LTGs   Status On-going      SLP LONG TERM GOAL #2   Title pt will follow swallow precautions with POs with modified independence x3 sessions    Time 6    Period Weeks    Status On-going      SLP LONG TERM GOAL #3   Title pt will demo [facility] skills in solving mod complex problems in WNL amount of time with modified independence (double checking answers , etc)    Time 6    Period Weeks    Status On-going      SLP LONG TERM GOAL #4   Title pt will demo altaernating attention in simple functional cognitive linguistic tasks in 3 sessions    Time 6    Status On-going            Plan - 03/23/20 1538    Clinical Impression Statement Teyona cont with noted impulsivity and decr'd focused attention which are beginning to hinder success in therapy tasks. Awareness  and simple executive function (problem solving) deficits also remain  apparent. 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 She is leaning towards early retirement.    Speech Therapy Frequency 2x / week    Duration 8 weeks   or 17 visits   Treatment/Interventions Aspiration precaution training;Pharyngeal strengthening exercises;Compensatory techniques;Diet toleration management by SLP;Trials of upgraded texture/liquids;Cueing hierarchy;Cognitive reorganization;Internal/external aids;Patient/family education;SLP instruction and feedback;Functional tasks    Potential to Achieve Goals Good           Patient will benefit from skilled therapeutic intervention in order to improve the following deficits and impairments:   Cognitive communication deficit  Dysphagia, oropharyngeal phase    Problem List Patient Active Problem List   Diagnosis Date Noted  . GIB (gastrointestinal bleeding) 02/28/2020  . Elevated BUN   . Prediabetes   . 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[place]) 01/20/2020  . Cerebral vasospasm   . Sinus tachycardia   . Dysphagia, post-stroke   . Thrombocytopenia ([place])   . Acute blood loss anemia   . Brain aneurysm   . ICH (intracerebral hemorrhage) ([place]) 01/05/2020    Nkosi Cortright, Annye Rusk MS, CCC-SLP 03/23/2020, 3:39 PM  [facility] [address] [place], [place], [address] Phone: [phone]   Fax:  [phone]   Name: MARINA BOERNER MRN: [id number] Date of Birth: 1955/08/26

## 2020-03-24 ENCOUNTER — Ambulatory Visit: Payer: BC Managed Care – PPO

## 2020-03-24 ENCOUNTER — Other Ambulatory Visit: Payer: Self-pay

## 2020-03-24 ENCOUNTER — Ambulatory Visit: Payer: BC Managed Care – PPO | Admitting: Rehabilitation

## 2020-03-24 ENCOUNTER — Encounter: Payer: Self-pay | Admitting: Rehabilitation

## 2020-03-24 DIAGNOSIS — R41841 Cognitive communication deficit: Secondary | ICD-10-CM

## 2020-03-24 DIAGNOSIS — R2689 Other abnormalities of gait and mobility: Secondary | ICD-10-CM

## 2020-03-24 DIAGNOSIS — R2681 Unsteadiness on feet: Secondary | ICD-10-CM

## 2020-03-24 DIAGNOSIS — M6281 Muscle weakness (generalized): Secondary | ICD-10-CM

## 2020-03-24 NOTE — Therapy (Signed)
[facility] [address] [place], [place], [address] Phone: [phone]   Fax:  [phone]  Occupational Therapy Treatment  Patient Details  Name: NATALLY RIBERA MRN: [id number] Date of Birth: 1955/07/12 Referring Provider (OT): Dr. Naaman Plummer   Encounter Date: 03/23/2020   OT End of Session - 03/24/20 1605    Visit Number 6    Number of Visits 25    Date for OT Re-Evaluation 05/25/20    Authorization Time Period State BCBS    OT Start Time 1404    OT Stop Time 1444    OT Time Calculation (min) 40 min    Activity Tolerance Patient tolerated treatment well    Behavior During Therapy [facility] for tasks assessed/performed           Past Medical History:  Diagnosis Date  . Melena     Past Surgical History:  Procedure Laterality Date  . CHOLECYSTECTOMY    . COLONOSCOPY  11/2017   at [facility]. no recurrent polyps.  suggest repeat surveillance study 11/2022.    Marland Kitchen COLONOSCOPY W/ POLYPECTOMY  06/2014   Dr Roxy Manns at [facility].  3 adenomatoous polyps, anal fissure.    . ESOPHAGOGASTRODUODENOSCOPY (EGD) WITH PROPOFOL N/A 01/27/2020   Procedure: ESOPHAGOGASTRODUODENOSCOPY (EGD) WITH PROPOFOL;  Surgeon: Mauri Pole, MD;  Location: [facility];  Service: Endoscopy;  Laterality: N/A;  . IR 3D INDEPENDENT WKST  01/06/2020  . IR ANGIO INTRA EXTRACRAN SEL INTERNAL CAROTID BILAT MOD SED  01/06/2020  . IR ANGIO VERTEBRAL SEL VERTEBRAL UNI L MOD SED  01/06/2020  . IR ANGIOGRAM FOLLOW UP STUDY  01/06/2020  . IR ANGIOGRAM FOLLOW UP STUDY  01/06/2020  . IR ANGIOGRAM FOLLOW UP STUDY  01/06/2020  . IR ANGIOGRAM FOLLOW UP STUDY  01/06/2020  . IR ANGIOGRAM FOLLOW UP STUDY  01/06/2020  . IR ANGIOGRAM FOLLOW UP STUDY  01/06/2020  . IR ANGIOGRAM FOLLOW UP STUDY  01/06/2020  . IR ANGIOGRAM FOLLOW UP STUDY  01/06/2020  . IR ANGIOGRAM FOLLOW UP STUDY  01/06/2020  . IR ANGIOGRAM FOLLOW UP STUDY  01/06/2020  . IR NEURO EACH ADD'L AFTER BASIC UNI RIGHT (MS)   01/06/2020  . IR TRANSCATH/EMBOLIZ  01/06/2020  . RADIOLOGY WITH ANESTHESIA N/A 01/06/2020   Procedure: IR WITH ANESTHESIA FOR ANEURYSM;  Surgeon: Consuella Lose, MD;  Location: [place];  Service: Radiology;  Laterality: N/A;  . TUBAL LIGATION      There were no vitals filed for this visit.   Subjective Assessment - 03/23/20 1403    Subjective  Pt reports right shoulder pain    Currently in Pain? Yes    Pain Score 2     Pain Location Shoulder    Pain Orientation Right    Pain Descriptors / Indicators Aching    Pain Type Acute pain    Pain Onset More than a month ago    Pain Frequency Intermittent    Aggravating Factors  movement    Pain Relieving Factors inactivity              Treatment: Pt demonstrates significantt shoulder pain today. Hotpack applied to right shoulder with shoulder under RUE. Pt was very tearful, reassurance provided. Pt was cued to gently rock LE's from side to side for distraction/ body on arm movements.Therapist performed gentle joint mobs, then gently moved RUE out to the side. Pt performed gentle AA/ROM elbow flexion extension. Gentle P/ROM to digits in flexion and extension followed by AA/ROM finger flexion/  extension. Sidelying on R shoulder pt performed A/ROM elbow flexion/ extension and supination pronation, min facilitation/ v.c Pt reports feeling better at end of session.                    OT Short Term Goals - 03/21/20 1430      OT SHORT TERM GOAL #1   Title I with initialHEP.    Time 6    Period Weeks    Status Achieved    Target Date 04/13/20      OT SHORT TERM GOAL #2   Title Pt will perform bathing with min A.    Time 6    Period Weeks    Status New      OT SHORT TERM GOAL #3   Title Pt will donn shirt and pants with min A    Time 6    Period Weeks    Status New      OT SHORT TERM GOAL #4   Title Pt will demonstrate 30 A/ROM shoulder flexion for RUE with pain less than or equal to 4/10.    Time 6    Period  Weeks    Status New      OT SHORT TERM GOAL #5   Title Pt will  demonstrate improved LUE fine motor coordination for ADLs as evidenced by decreasing 9 hole peg test score to 45 secs or less.    Time 6    Period Weeks    Status New      OT SHORT TERM GOAL #6   Title Pt/ caregiver will verbalize understanding of RUE positioning and edema control techniques to minimize pain and risk for injury    Time 6    Period Weeks    Status New      OT SHORT TERM GOAL #7   Title Pt will demonstrate at least 25% finger flexion/ extension in RUE in prep for functional use.    Time 6    Period Weeks    Status New             OT Long Term Goals - 03/02/20 1527      OT LONG TERM GOAL #1   Title Pt will perfrom  all basic ADLS with supervision    Time 12    Period Weeks    Status New      OT LONG TERM GOAL #2   Title Pt will demonstrate 45* RUE shoulder flexion in prep for functional reach with pain less than or equal to 3/10.    Time 12    Period Weeks    Status New      OT LONG TERM GOAL #3   Title Pt will use RUE as a stabilizer/ gross A at least 30 % of the time for ADLs/ IADLs.    Time 12    Period Weeks    Status New      OT LONG TERM GOAL #4   Title Pt will increase LUE grip strength to 25 lbs or greater for increased ease with ADLs.    Time 12    Period Weeks    Status New      OT LONG TERM GOAL #5   Title Pt will perform simple home management/ snack prep with min A.    Time 12    Period Weeks    Status New      Long Term Additional Goals   Additional Long Term Goals Yes  OT LONG TERM GOAL #6   Title Pt will demonstrate ability to grasp/ release a cup 2/3 trials with RUE.    Time 12    Period Weeks    Status New      OT LONG TERM GOAL #7   Title Pt will safely navigate a busy environment and locate items with supervision and  90% or greater accuracy without bumping into items.    Time 12    Period Weeks    Status New      OT LONG TERM GOAL #8   Title  I with updated HEP.    Time 12    Period Weeks    Status New                 Plan - 03/24/20 1606    Clinical Impression Statement Pt continues to be significantly limited by shoulder pain. Pt is scheduled for an injection on Friday to address shoulder pain. Pt to transition to therapy at [facility] in December. Pt to hopefully begin 3x week OT starting end of November and beginning of December as scheduling allows.    OT Occupational Profile and History Detailed Assessment- Review of Records and additional review of physical, cognitive, psychosocial history related to current functional performance    Occupational performance deficits (Please refer to evaluation for details): ADL's;IADL's;Rest and Sleep;Work;Play;Leisure    Body Structure / Function / Physical Skills ADL;Balance;Coordination;Decreased knowledge of precautions;Decreased knowledge of use of DME;Dexterity;Edema;Mobility;Tone;Strength;IADL;Sensation;GMC;Gait;ROM;FMC;Flexibility;Pain;Vision;UE functional use;Endurance    Cognitive Skills Attention;Perception;Problem Solve;Safety Awareness;Thought    Rehab Potential Good    Clinical Decision Making Several treatment options, min-mod task modification necessary    Comorbidities Affecting Occupational Performance: None    Modification or Assistance to Complete Evaluation  Min-Moderate modification of tasks or assist with assess necessary to complete eval    OT Frequency 2x / week   plan to increase frequency end of NOV/ beginning of Dec to 3x week, will sent off new cert then   OT Duration 12 weeks    OT Treatment/Interventions Self-care/ADL training;Ultrasound;Energy conservation;Visual/perceptual remediation/compensation;Patient/family education;DME and/or AE instruction;Aquatic Therapy;Paraffin;Gait Training;Passive range of motion;Balance training;Fluidtherapy;Electrical Stimulation;Functional Mobility Training;Splinting;Moist Heat;Therapeutic exercise;Manual Therapy;Cognitive  remediation/compensation;Manual lymph drainage;Neuromuscular education;Coping strategies training    Plan address RUE shoulder pain, heat to R shoulder prior to gentle mobilization, edema management to hand, simple ADLs.    Consulted and Agree with Plan of Care Patient;Family member/caregiver    Family Member Consulted spouse present           Patient will benefit from skilled therapeutic intervention in order to improve the following deficits and impairments:   Body Structure / Function / Physical Skills: ADL, Balance, Coordination, Decreased knowledge of precautions, Decreased knowledge of use of DME, Dexterity, Edema, Mobility, Tone, Strength, IADL, Sensation, GMC, Gait, ROM, FMC, Flexibility, Pain, Vision, UE functional use, Endurance Cognitive Skills: Attention, Perception, Problem Solve, Safety Awareness, Thought     Visit Diagnosis: Muscle weakness (generalized)  Acute pain of right shoulder  Pain in right hand  Other lack of coordination  Frontal lobe and executive function deficit  Attention and concentration deficit  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 GIB (gastrointestinal bleeding) 02/28/2020  . Elevated BUN   . Prediabetes   . 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 Cerebral vasospasm   . Sinus tachycardia   . Dysphagia, post-stroke   . Thrombocytopenia ([place])   .  Acute blood loss anemia   . Brain aneurysm   . ICH (intracerebral hemorrhage) ([place]) 01/05/2020    Bodee Lafoe 03/24/2020, 4:13 PM  [facility] [address] [place], [place], [address] Phone: [phone]   Fax:  [phone]  Name: ZARI CLY MRN: [id number] Date of Birth: 07-11-1955

## 2020-03-24 NOTE — Therapy (Signed)
[facility] [address] [place], [place], [address] Phone: [phone]   Fax:  [phone]  Physical Therapy Treatment  Patient Details  Name: Erika Stanton MRN: [id number] Date of Birth: 1956-01-18 Referring Provider (PT): Alger Simons, MD   Encounter Date: 03/24/2020   PT End of Session - 03/24/20 1405    Visit Number 12    Number of Visits 21    Date for PT Re-Evaluation 04/25/20    Authorization Type BCBS State    PT Start Time 0930    PT Stop Time 1014    PT Time Calculation (min) 44 min    Equipment Utilized During Treatment Gait belt    Activity Tolerance Patient tolerated treatment well    Behavior During Therapy [facility] for tasks assessed/performed           Past Medical History:  Diagnosis Date  . Melena     Past Surgical History:  Procedure Laterality Date  . CHOLECYSTECTOMY    . COLONOSCOPY  11/2017   at [facility]. no recurrent polyps.  suggest repeat surveillance study 11/2022.    Marland Kitchen COLONOSCOPY W/ POLYPECTOMY  06/2014   Dr Roxy Manns at [facility].  3 adenomatoous polyps, anal fissure.    . ESOPHAGOGASTRODUODENOSCOPY (EGD) WITH PROPOFOL N/A 01/27/2020   Procedure: ESOPHAGOGASTRODUODENOSCOPY (EGD) WITH PROPOFOL;  Surgeon: Mauri Pole, MD;  Location: [facility];  Service: Endoscopy;  Laterality: N/A;  . IR 3D INDEPENDENT WKST  01/06/2020  . IR ANGIO INTRA EXTRACRAN SEL INTERNAL CAROTID BILAT MOD SED  01/06/2020  . IR ANGIO VERTEBRAL SEL VERTEBRAL UNI L MOD SED  01/06/2020  . IR ANGIOGRAM FOLLOW UP STUDY  01/06/2020  . IR ANGIOGRAM FOLLOW UP STUDY  01/06/2020  . IR ANGIOGRAM FOLLOW UP STUDY  01/06/2020  . IR ANGIOGRAM FOLLOW UP STUDY  01/06/2020  . IR ANGIOGRAM FOLLOW UP STUDY  01/06/2020  . IR ANGIOGRAM FOLLOW UP STUDY  01/06/2020  . IR ANGIOGRAM FOLLOW UP STUDY  01/06/2020  . IR ANGIOGRAM FOLLOW UP STUDY  01/06/2020  . IR ANGIOGRAM FOLLOW UP STUDY  01/06/2020  . IR ANGIOGRAM FOLLOW UP STUDY  01/06/2020  .  IR NEURO EACH ADD'L AFTER BASIC UNI RIGHT (MS)  01/06/2020  . IR TRANSCATH/EMBOLIZ  01/06/2020  . RADIOLOGY WITH ANESTHESIA N/A 01/06/2020   Procedure: IR WITH ANESTHESIA FOR ANEURYSM;  Surgeon: Consuella Lose, MD;  Location: [facility];  Service: Radiology;  Laterality: N/A;  . TUBAL LIGATION      There were no vitals filed for this visit.   Subjective Assessment - 03/24/20 0932    Subjective Pt reports getting cortisone shot in [place] tomorrow.    Patient is accompained by: Family member    Limitations Walking;Standing;House hold activities    Patient Stated Goals Use my right hand like I used to.    Currently in Pain? Yes    Pain Score 2     Pain Location Arm    Pain Orientation Right    Pain Descriptors / Indicators Aching    Pain Type Acute pain    Pain Onset More than a month ago    Pain Frequency Intermittent    Aggravating Factors  movement    Pain Relieving Factors inactivity                             [facility] Adult PT Treatment/Exercise - 03/24/20 0940      Ambulation/Gait  Ambulation/Gait Yes    Ambulation/Gait Assistance 5: Supervision    Ambulation/Gait Assistance Details Needs S esp during more dynamic balance challenges with gait.      Ambulation Distance (Feet) 500 Feet    Assistive device None    Gait Pattern Step-through pattern;Decreased arm swing - right    Ambulation Surface Level;Indoor      High Level Balance   High Level Balance Activities Braiding;Backward walking;Direction changes;Turns;Sudden stops;Head turns    High Level Balance Comments Performed above tasks x 500' with S needed.  Some sway noted with horitzontal head turns.        Neuro Re-ed    Neuro Re-ed Details  Core strengthening and trunk/pelvic dissociation while on physioball:  Hands supported on lap while marching alternating LEs x 20 reps, LAQs x 10 reps each, ant/posterior pelvic tilt x 10 reps with heavy to light facilitation at pelvis for correct initiation vs at  shoulders.  Side/side pelvic motions, again with cues for hip motion vs shoulder motion x 10 reps each side.  Note that during seated physioball exercises, PT worked on moving [place] from her lap to PTs lap beside of her, slowly increasing abduction from body.  Pt able to tolerate well when distracted by other exercise.  We did attempt to get into floor and complete more core and RLE NMR tasks, however due to knee pain, she was unable to tolerate this position for very long despite having two mats under knees for support.  She is able to get into floor with min A and able to complete 5 reps of transitioning from tall knee to L half kneel with cues for maintaining upright posture and R hip activation.  Pt needed min/mod A to get back to mat.  Then worked on mat with sit<>stand with emphasis on midline posture and even progressing to more LLE WB.  Pt needs max facilitation initially at trunk for maintaining upright posture withflexion at hips and improving forward trunk flexion when sitting and standing.  Performed x 10 reps then progressed to having her RUE to PTs lap with pt flexing forward to avoid shoulder pain but tolerating arm being away from body slightly.  Pt tolerated a few reps in this manner.  Ended session with high level balance with emphasis on SLS esp on LLE with altnerating cone tapping while standing on foam mat x 4 reps moving laterally (x 2 sets) progressing to tapping across then in front before returning to floor x 10 reps.  Pt needs max verbal and tactile cues for sustained LLE activation.                      PT Short Term Goals - 03/23/20 1335      PT SHORT TERM GOAL #1   Title Pt/spouse will be independent with initial HEP in order to indicate decreased fall risk and improved functional mobility.  (Target Date: 03/26/20)    Baseline performing most of HEP 03/21/20    Time 4    Period Weeks    Status Achieved    Target Date 03/26/20      PT SHORT TERM GOAL #2    Title Pt will complete BERG balance test and improve score by 6 points from baseline in order to indicate decreased fall risk.    Baseline 46/56 on 03/01/20; 54/56 03/21/20    Time 4    Period Weeks    Status Achieved      PT  SHORT TERM GOAL #3   Title Pt will improve 5TSS to </=25 secs with single UE support in order to indicate dec fall risk and improved functional strength.    Baseline 16 sec with one UE (03/21/20)    Time 4    Period Weeks    Status Achieved      PT SHORT TERM GOAL #4   Title Pt will improve gait speed to >/=2.4 ft/sec with LRAD in order to indicate dec fall risk.    Baseline 03/23/20 3.21 ft/sec    Time 4    Period Weeks    Status Achieved      PT SHORT TERM GOAL #5   Title Pt will ambulate x 58' at S level with LRAD in simulated household environment in order to indicate improved independence in her home.    Baseline 1050' without AD    Time 4    Period Weeks    Status Achieved             PT Long Term Goals - 03/23/20 1422      PT LONG TERM GOAL #1   Title Pt will be independent with progressive HEP for balance and strengthening to continue gains on own.    Time 8    Period Weeks    Status Revised    Target Date 04/25/20      PT LONG TERM GOAL #2   Title Pt will increase FGA from 19/30 to >24/30 for improved balance and gait to decrease fall risk.    Baseline 03/23/20 19/30    Time 8    Period Weeks    Status New    Target Date 04/25/20      PT LONG TERM GOAL #3   Title Pt will perform 5TSS in </=13 secs without UE support in order to indicate improved functional strength    Baseline 16 seconds with L UE support 03/21/20    Time 8    Period Weeks    Status Revised    Target Date 04/25/20      PT LONG TERM GOAL #4   Title Pt will ambulate >1000' on varied surfaces independently with no LOB for improved community ambulation without AD.    Baseline 1050' including >300 feet of grass surface with SBA 03/21/20; we worked on ramp and curb also.     Time 8    Period Weeks    Status Revised    Target Date 04/25/20                 Plan - 03/24/20 1406    Clinical Impression Statement Skilled session focused on core/pelvic/LLE NMR with seated exercises as well as high level dynamic gait/balance tasks.  Pt continues to demonstate decreased core strength and dec ability to sustain LLE activation but does show improvement with continued practice.    Personal Factors and Comorbidities Age;Comorbidity 1;Profession    Comorbidities B ICHs    Examination-Activity Limitations Bend;Carry;Lift;Locomotion Level;Reach Overhead;Squat;Stairs;Stand;Transfers    Examination-Participation Restrictions Cleaning;Community Activity;Driving;Occupation;Yard Work;Laundry;Meal Prep    Stability/Clinical Decision Making Evolving/Moderate complexity    Rehab Potential Good    PT Frequency 3x / week   then 2x/wk for 4 weeks   PT Duration 4 weeks   then 2x/wk for 4 weeks   PT Treatment/Interventions ADLs/Self Care Home Management;Aquatic Therapy;Electrical Stimulation;DME Instruction;Gait training;Stair training;Functional mobility training;Therapeutic activities;Therapeutic exercise;Balance training;Neuromuscular re-education;Cognitive remediation;Patient/family education;Orthotic Fit/Training;Manual techniques;Passive range of motion;Dry needling;Energy conservation;Vestibular;Visual/perceptual remediation/compensation    PT Next Visit Plan  Continue with dynamic gait and balance activities. Activities to try to incorporate more core and shoulder activation as able, LLE NMR. Can try seated on physioball or tall kneeling again.    Consulted and Agree with Plan of Care Patient;Family member/caregiver           Patient will benefit from skilled therapeutic intervention in order to improve the following deficits and impairments:  Abnormal gait, Decreased activity tolerance, Decreased balance, Decreased cognition, Decreased endurance, Decreased knowledge of  use of DME, Decreased mobility, Decreased strength, Impaired perceived functional ability, Impaired flexibility, Impaired sensation, Postural dysfunction, Improper body mechanics, Impaired vision/preception, Impaired UE functional use, Impaired tone  Visit Diagnosis: Other abnormalities of gait and mobility  Muscle weakness (generalized)  Unsteadiness on feet     Problem List Patient Active Problem List   Diagnosis Date Noted  . GIB (gastrointestinal bleeding) 02/28/2020  . Elevated BUN   . Prediabetes   . 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[place]) 01/20/2020  . Cerebral vasospasm   . Sinus tachycardia   . Dysphagia, post-stroke   . Thrombocytopenia ([place])   . Acute blood loss anemia   . Brain aneurysm   . ICH (intracerebral hemorrhage) ([place]) 01/05/2020    Cameron Sprang, PT, MPT [facility] [address] [place], [place], [address] Phone: [phone]   Fax:  [phone] 03/24/20, 2:09 PM  Name: TEQUISHA MAAHS MRN: [id number] Date of Birth: Jan 24, 1956

## 2020-03-24 NOTE — Therapy (Signed)
[facility] [address] [place], [place], [address] Phone: [phone]   Fax:  [phone]  Patient Details  Name: Erika Stanton MRN: [id number] Date of Birth: Nov 20, 1955  Encounter Date: 03/24/2020  Pt arrived today for her third session ST this week, however SLP was not aware of this.   Pt plan of care is for x2/week.  Pt was not charged for today's session.  [facility] 03/24/2020, 11:59 AM  [facility] [address] [place], [place], [address] Phone: [phone]   Fax:  [phone]

## 2020-03-25 ENCOUNTER — Encounter: Payer: Self-pay | Admitting: Physical Medicine and Rehabilitation

## 2020-03-25 ENCOUNTER — Other Ambulatory Visit: Payer: Self-pay

## 2020-03-25 ENCOUNTER — Encounter (HOSPITAL_BASED_OUTPATIENT_CLINIC_OR_DEPARTMENT_OTHER): Payer: BC Managed Care – PPO | Admitting: Physical Medicine and Rehabilitation

## 2020-03-25 VITALS — BP 129/83 | HR 77 | Temp 98.0°F | Ht 66.0 in | Wt 177.0 lb

## 2020-03-25 DIAGNOSIS — M7501 Adhesive capsulitis of right shoulder: Secondary | ICD-10-CM | POA: Diagnosis not present

## 2020-03-25 DIAGNOSIS — I610 Nontraumatic intracerebral hemorrhage in hemisphere, subcortical: Secondary | ICD-10-CM | POA: Diagnosis not present

## 2020-03-25 NOTE — Progress Notes (Signed)
Shoulder injection, right   Indication:Right Shoulder pain not relieved by medication management and other conservative care.  Informed consent was obtained after describing risks and benefits of the procedure with the patient, this includes bleeding, bruising, infection and medication side effects. The patient wishes to proceed and has given written consent. Patient was placed in a seated position. The right shoulder was marked and prepped with betadine in the subacromial area. A 25-gauge 1-1/2 inch needle was inserted into the subacromial area. After negative draw back for blood, a solution containing 1 mL of Triamcinolone and 3 mL of 1% lidocaine was injected. A band aid was applied. The patient tolerated the procedure well. Post procedure instructions were given.

## 2020-03-28 ENCOUNTER — Ambulatory Visit: Payer: BC Managed Care – PPO | Admitting: Occupational Therapy

## 2020-03-28 ENCOUNTER — Ambulatory Visit: Payer: BC Managed Care – PPO

## 2020-03-28 ENCOUNTER — Encounter: Payer: Self-pay | Admitting: Rehabilitation

## 2020-03-28 ENCOUNTER — Other Ambulatory Visit: Payer: Self-pay

## 2020-03-28 ENCOUNTER — Ambulatory Visit: Payer: BC Managed Care – PPO | Admitting: Rehabilitation

## 2020-03-28 DIAGNOSIS — R2689 Other abnormalities of gait and mobility: Secondary | ICD-10-CM

## 2020-03-28 DIAGNOSIS — M6281 Muscle weakness (generalized): Secondary | ICD-10-CM

## 2020-03-28 DIAGNOSIS — I69159 Hemiplegia and hemiparesis following nontraumatic intracerebral hemorrhage affecting unspecified side: Secondary | ICD-10-CM

## 2020-03-28 DIAGNOSIS — R2681 Unsteadiness on feet: Secondary | ICD-10-CM

## 2020-03-28 DIAGNOSIS — R41841 Cognitive communication deficit: Secondary | ICD-10-CM

## 2020-03-28 DIAGNOSIS — M25511 Pain in right shoulder: Secondary | ICD-10-CM

## 2020-03-28 DIAGNOSIS — M79641 Pain in right hand: Secondary | ICD-10-CM

## 2020-03-28 NOTE — Therapy (Signed)
[facility] [address] [place], [place], [address] Phone: [phone]   Fax:  [phone]  Physical Therapy Treatment  Patient Details  Name: Erika Stanton MRN: [id number] Date of Birth: 1956-01-09 Referring Provider (PT): Alger Simons, MD   Encounter Date: 03/28/2020   PT End of Session - 03/28/20 1505    Visit Number 13    Number of Visits 21    Date for PT Re-Evaluation 04/25/20    Authorization Type BCBS State    PT Start Time 1401    PT Stop Time 1445    PT Time Calculation (min) 44 min    Equipment Utilized During Treatment Gait belt    Activity Tolerance Patient tolerated treatment well    Behavior During Therapy [facility] for tasks assessed/performed           Past Medical History:  Diagnosis Date  . Melena     Past Surgical History:  Procedure Laterality Date  . CHOLECYSTECTOMY    . COLONOSCOPY  11/2017   at [facility]. no recurrent polyps.  suggest repeat surveillance study 11/2022.    Marland Kitchen COLONOSCOPY W/ POLYPECTOMY  06/2014   Dr Roxy Manns at [facility].  3 adenomatoous polyps, anal fissure.    . ESOPHAGOGASTRODUODENOSCOPY (EGD) WITH PROPOFOL N/A 01/27/2020   Procedure: ESOPHAGOGASTRODUODENOSCOPY (EGD) WITH PROPOFOL;  Surgeon: Mauri Pole, MD;  Location: [facility];  Service: Endoscopy;  Laterality: N/A;  . IR 3D INDEPENDENT WKST  01/06/2020  . IR ANGIO INTRA EXTRACRAN SEL INTERNAL CAROTID BILAT MOD SED  01/06/2020  . IR ANGIO VERTEBRAL SEL VERTEBRAL UNI L MOD SED  01/06/2020  . IR ANGIOGRAM FOLLOW UP STUDY  01/06/2020  . IR ANGIOGRAM FOLLOW UP STUDY  01/06/2020  . IR ANGIOGRAM FOLLOW UP STUDY  01/06/2020  . IR ANGIOGRAM FOLLOW UP STUDY  01/06/2020  . IR ANGIOGRAM FOLLOW UP STUDY  01/06/2020  . IR ANGIOGRAM FOLLOW UP STUDY  01/06/2020  . IR ANGIOGRAM FOLLOW UP STUDY  01/06/2020  . IR ANGIOGRAM FOLLOW UP STUDY  01/06/2020  . IR ANGIOGRAM FOLLOW UP STUDY  01/06/2020  . IR ANGIOGRAM FOLLOW UP STUDY  01/06/2020  .  IR NEURO EACH ADD'L AFTER BASIC UNI RIGHT (MS)  01/06/2020  . IR TRANSCATH/EMBOLIZ  01/06/2020  . RADIOLOGY WITH ANESTHESIA N/A 01/06/2020   Procedure: IR WITH ANESTHESIA FOR ANEURYSM;  Surgeon: Consuella Lose, MD;  Location: [facility];  Service: Radiology;  Laterality: N/A;  . TUBAL LIGATION      There were no vitals filed for this visit.   Subjective Assessment - 03/28/20 1406    Subjective Pt reports getting cortisone shot in shoulder on Friday.    Limitations Walking;Standing;House hold activities    Patient Stated Goals Use my right hand like I used to.    Currently in Pain? Yes    Pain Score 2     Pain Location Arm    Pain Orientation Right    Pain Descriptors / Indicators Aching    Pain Type Chronic pain    Pain Onset More than a month ago    Pain Frequency Intermittent    Aggravating Factors  movement    Pain Relieving Factors rest, heat                             [facility] Adult PT Treatment/Exercise - 03/28/20 1432      Ambulation/Gait   Ambulation/Gait Yes    Ambulation/Gait Assistance  5: Supervision    Ambulation/Gait Assistance Details Ended session with gait outdoors over unlevel paved and grassy surfaces x 500' at S level.  Cues for upright posture (esp at R shoulder girdle) and maintaining alignment to ensure core activation.  Short bout of backwards gait x 30' with marked difficulty and min/guard A neede and max cues for larger/wider steps.      Ambulation Distance (Feet) 500 Feet    Assistive device None    Gait Pattern Step-through pattern;Decreased arm swing - right    Ambulation Surface Unlevel;Outdoor;Paved;Grass      Neuro Re-ed    Neuro Re-ed Details  NMR for LLE and RUE:  Tall kneeling with kaye bench modified quadruped on elbows with lateral weight shifting x 10 reps (PT supporting at axilla lightly and at hand to maintain aligment througout.)  Progressed to alternating LE extension x 5 reps on each side (did have tech help slightly at hips  for adequate weight shift).  Transitioned to seated position with PT in front in arm chair placing pts R hand on PTs leg with sit>stand transitional movements having her "push through arm" to lift into stand and then PT supporting arm once upright.  Continued with arms extended on bowls (for comfort in hand/wrist) placed on chair with arms extended going from squat position back to upright position x 10 reps.  Also incorporated side/side shifts while in squat and always returning to upright with arms extended.   Standing on foam airex tapping 3 cones with RLE (in front/laterally/diagonally) to engage LLE.  Pt conitnues to be able to activate LLE however is unable to sustain for longer periods of time.  Transitioned back to modified quadruped, however was unable to tolerate WB through R elbow this time, but was able to maintain in tall kneeling with LUE support moving from tall kneel to R half kneel.  PT providing facilitation and cuing at L pelvis/hip for sustained activation when raising and lowering RLE.  Pt did very well with this as we repeated task.                      PT Short Term Goals - 03/23/20 1335      PT SHORT TERM GOAL #1   Title Pt/spouse will be independent with initial HEP in order to indicate decreased fall risk and improved functional mobility.  (Target Date: 03/26/20)    Baseline performing most of HEP 03/21/20    Time 4    Period Weeks    Status Achieved    Target Date 03/26/20      PT SHORT TERM GOAL #2   Title Pt will complete BERG balance test and improve score by 6 points from baseline in order to indicate decreased fall risk.    Baseline 46/56 on 03/01/20; 54/56 03/21/20    Time 4    Period Weeks    Status Achieved      PT SHORT TERM GOAL #3   Title Pt will improve 5TSS to </=25 secs with single UE support in order to indicate dec fall risk and improved functional strength.    Baseline 16 sec with one UE (03/21/20)    Time 4    Period Weeks    Status  Achieved      PT SHORT TERM GOAL #4   Title Pt will improve gait speed to >/=2.4 ft/sec with LRAD in order to indicate dec fall risk.    Baseline 03/23/20 3.21 ft/sec  Time 4    Period Weeks    Status Achieved      PT SHORT TERM GOAL #5   Title Pt will ambulate x 4' at S level with LRAD in simulated household environment in order to indicate improved independence in her home.    Baseline 1050' without AD    Time 4    Period Weeks    Status Achieved             PT Long Term Goals - 03/23/20 1422      PT LONG TERM GOAL #1   Title Pt will be independent with progressive HEP for balance and strengthening to continue gains on own.    Time 8    Period Weeks    Status Revised    Target Date 04/25/20      PT LONG TERM GOAL #2   Title Pt will increase FGA from 19/30 to >24/30 for improved balance and gait to decrease fall risk.    Baseline 03/23/20 19/30    Time 8    Period Weeks    Status New    Target Date 04/25/20      PT LONG TERM GOAL #3   Title Pt will perform 5TSS in </=13 secs without UE support in order to indicate improved functional strength    Baseline 16 seconds with L UE support 03/21/20    Time 8    Period Weeks    Status Revised    Target Date 04/25/20      PT LONG TERM GOAL #4   Title Pt will ambulate >1000' on varied surfaces independently with no LOB for improved community ambulation without AD.    Baseline 1050' including >300 feet of grass surface with SBA 03/21/20; we worked on ramp and curb also.    Time 8    Period Weeks    Status Revised    Target Date 04/25/20                 Plan - 03/28/20 1506    Clinical Impression Statement Pt able to tolerate more WB and body on arm movements today during session.  Continue to incorporate this as much as possible with core and L LE strengthening.    Personal Factors and Comorbidities Age;Comorbidity 1;Profession    Comorbidities B ICHs    Examination-Activity Limitations  Bend;Carry;Lift;Locomotion Level;Reach Overhead;Squat;Stairs;Stand;Transfers    Examination-Participation Restrictions Cleaning;Community Activity;Driving;Occupation;Yard Work;Laundry;Meal Prep    Stability/Clinical Decision Making Evolving/Moderate complexity    Rehab Potential Good    PT Frequency 3x / week   then 2x/wk for 4 weeks   PT Duration 4 weeks   then 2x/wk for 4 weeks   PT Treatment/Interventions ADLs/Self Care Home Management;Aquatic Therapy;Electrical Stimulation;DME Instruction;Gait training;Stair training;Functional mobility training;Therapeutic activities;Therapeutic exercise;Balance training;Neuromuscular re-education;Cognitive remediation;Patient/family education;Orthotic Fit/Training;Manual techniques;Passive range of motion;Dry needling;Energy conservation;Vestibular;Visual/perceptual remediation/compensation    PT Next Visit Plan Continue with dynamic gait and balance activities. Activities to try to incorporate more core and shoulder activation as able, LLE NMR. Can try seated on physioball or tall kneeling again.    Consulted and Agree with Plan of Care Patient;Family member/caregiver           Patient will benefit from skilled therapeutic intervention in order to improve the following deficits and impairments:  Abnormal gait, Decreased activity tolerance, Decreased balance, Decreased cognition, Decreased endurance, Decreased knowledge of use of DME, Decreased mobility, Decreased strength, Impaired perceived functional ability, Impaired flexibility, Impaired sensation, Postural dysfunction, Improper body mechanics, Impaired  vision/preception, Impaired UE functional use, Impaired tone  Visit Diagnosis: Other abnormalities of gait and mobility  Muscle weakness (generalized)  Unsteadiness on feet     Problem List Patient Active Problem List   Diagnosis Date Noted  . GIB (gastrointestinal bleeding) 02/28/2020  . Elevated BUN   . Prediabetes   . Cerebral aneurysm  rupture ([place]) 01/20/2020  . Cerebral vasospasm   . Sinus tachycardia   . Dysphagia, post-stroke   . Thrombocytopenia ([place])   . Acute blood loss anemia   . Brain aneurysm   . ICH (intracerebral hemorrhage) ([place]) 01/05/2020    Cameron Sprang, PT, MPT [facility] [address] [place], [place], [address] Phone: [phone]   Fax:  [phone] 03/28/20, 3:08 PM  Name: Erika Stanton MRN: [id number] Date of Birth: 05/25/1955

## 2020-03-28 NOTE — Patient Instructions (Signed)
  Continue to work on [employer], 60 minutes a day, 4 days a week.

## 2020-03-28 NOTE — Therapy (Signed)
[facility] [address] [place], [place], [address] Phone: [phone]   Fax:  [phone]  Occupational Therapy Treatment  Patient Details  Name: Erika Stanton MRN: [id number] Date of Birth: [birth year] Referring Provider (OT): Dr. Naaman Plummer   Encounter Date: 03/28/2020   OT End of Session - 03/28/20 1408    Visit Number 7    Number of Visits 25    Date for OT Re-Evaluation 05/25/20    Authorization Time Period State BCBS    OT Start Time 1315    OT Stop Time 1400    OT Time Calculation (min) 45 min    Activity Tolerance Patient tolerated treatment well    Behavior During Therapy [facility] for tasks assessed/performed           Past Medical History:  Diagnosis Date  . Melena     Past Surgical History:  Procedure Laterality Date  . CHOLECYSTECTOMY    . COLONOSCOPY  11/2017   at [facility]. no recurrent polyps.  suggest repeat surveillance study 11/2022.    Marland Kitchen COLONOSCOPY W/ POLYPECTOMY  06/2014   Dr Roxy Manns at [facility].  3 adenomatoous polyps, anal fissure.    . ESOPHAGOGASTRODUODENOSCOPY (EGD) WITH PROPOFOL N/A 01/27/2020   Procedure: ESOPHAGOGASTRODUODENOSCOPY (EGD) WITH PROPOFOL;  Surgeon: Mauri Pole, MD;  Location: [facility];  Service: Endoscopy;  Laterality: N/A;  . IR 3D INDEPENDENT WKST  01/06/2020  . IR ANGIO INTRA EXTRACRAN SEL INTERNAL CAROTID BILAT MOD SED  01/06/2020  . IR ANGIO VERTEBRAL SEL VERTEBRAL UNI L MOD SED  01/06/2020  . IR ANGIOGRAM FOLLOW UP STUDY  01/06/2020  . IR ANGIOGRAM FOLLOW UP STUDY  01/06/2020  . IR ANGIOGRAM FOLLOW UP STUDY  01/06/2020  . IR ANGIOGRAM FOLLOW UP STUDY  01/06/2020  . IR ANGIOGRAM FOLLOW UP STUDY  01/06/2020  . IR ANGIOGRAM FOLLOW UP STUDY  01/06/2020  . IR ANGIOGRAM FOLLOW UP STUDY  01/06/2020  . IR ANGIOGRAM FOLLOW UP STUDY  01/06/2020  . IR ANGIOGRAM FOLLOW UP STUDY  01/06/2020  . IR ANGIOGRAM FOLLOW UP STUDY  01/06/2020  . IR NEURO EACH ADD'L AFTER BASIC UNI RIGHT (MS)   01/06/2020  . IR TRANSCATH/EMBOLIZ  01/06/2020  . RADIOLOGY WITH ANESTHESIA N/A 01/06/2020   Procedure: IR WITH ANESTHESIA FOR ANEURYSM;  Surgeon: Consuella Lose, MD;  Location: [facility];  Service: Radiology;  Laterality: N/A;  . TUBAL LIGATION      There were no vitals filed for this visit.   Subjective Assessment - 03/28/20 1322    Subjective  Pt reports right shoulder pain    Pertinent History ICH 01/06/20, Aneurysm. PMH: OA bilateral knees and hands    Currently in Pain? Yes    Pain Score 1    up to 10/10 with any movement   Pain Location Shoulder    Pain Orientation Right    Pain Descriptors / Indicators Aching    Pain Type Acute pain    Pain Onset More than a month ago    Pain Frequency Intermittent    Aggravating Factors  movement    Pain Relieving Factors rest, heat          Hot pack Rt shoulder while stretching Rt hand in composite flexion and extension - pt extremely tight at PIP joints and entire index finger. Emphasized importance of maintaining ROM and stretching fingers both in flexion and extension.  Body on arm movements with BUE wt bearing over chair w/ A/P and lateral wt  shifts, followed by wt bearing edge of mat with lateral trunk flexion bilaterally w/ cues to push down through heel of hand.  Progressed to AA/ROM in gravity elim plane - pt able to progress to further out ranges vs. totally into gravity (using pool noodle and sliding board)                        OT Short Term Goals - 03/21/20 1430      OT SHORT TERM GOAL #1   Title I with initialHEP.    Time 6    Period Weeks    Status Achieved    Target Date 04/13/20      OT SHORT TERM GOAL #2   Title Pt will perform bathing with min A.    Time 6    Period Weeks    Status New      OT SHORT TERM GOAL #3   Title Pt will donn shirt and pants with min A    Time 6    Period Weeks    Status New      OT SHORT TERM GOAL #4   Title Pt will demonstrate 30 A/ROM shoulder flexion for RUE  with pain less than or equal to 4/10.    Time 6    Period Weeks    Status New      OT SHORT TERM GOAL #5   Title Pt will  demonstrate improved LUE fine motor coordination for ADLs as evidenced by decreasing 9 hole peg test score to 45 secs or less.    Time 6    Period Weeks    Status New      OT SHORT TERM GOAL #6   Title Pt/ caregiver will verbalize understanding of RUE positioning and edema control techniques to minimize pain and risk for injury    Time 6    Period Weeks    Status New      OT SHORT TERM GOAL #7   Title Pt will demonstrate at least 25% finger flexion/ extension in RUE in prep for functional use.    Time 6    Period Weeks    Status New             OT Long Term Goals - 03/02/20 1527      OT LONG TERM GOAL #1   Title Pt will perfrom  all basic ADLS with supervision    Time 12    Period Weeks    Status New      OT LONG TERM GOAL #2   Title Pt will demonstrate 45* RUE shoulder flexion in prep for functional reach with pain less than or equal to 3/10.    Time 12    Period Weeks    Status New      OT LONG TERM GOAL #3   Title Pt will use RUE as a stabilizer/ gross A at least 30 % of the time for ADLs/ IADLs.    Time 12    Period Weeks    Status New      OT LONG TERM GOAL #4   Title Pt will increase LUE grip strength to 25 lbs or greater for increased ease with ADLs.    Time 12    Period Weeks    Status New      OT LONG TERM GOAL #5   Title Pt will perform simple home management/ snack prep with min A.  Time 12    Period Weeks    Status New      Long Term Additional Goals   Additional Long Term Goals Yes      OT LONG TERM GOAL #6   Title Pt will demonstrate ability to grasp/ release a cup 2/3 trials with RUE.    Time 12    Period Weeks    Status New      OT LONG TERM GOAL #7   Title Pt will safely navigate a busy environment and locate items with supervision and  90% or greater accuracy without bumping into items.    Time 12     Period Weeks    Status New      OT LONG TERM GOAL #8   Title I with updated HEP.    Time 12    Period Weeks    Status New                 Plan - 03/28/20 1409    Clinical Impression Statement Pt able to tolerate increased ROM at shoulder and elbow with initial body on arm movements and wt bearing activities    OT Occupational Profile and History Detailed Assessment- Review of Records and additional review of physical, cognitive, psychosocial history related to current functional performance    Occupational performance deficits (Please refer to evaluation for details): ADL's;IADL's;Rest and Sleep;Work;Play;Leisure    Body Structure / Function / Physical Skills ADL;Balance;Coordination;Decreased knowledge of precautions;Decreased knowledge of use of DME;Dexterity;Edema;Mobility;Tone;Strength;IADL;Sensation;GMC;Gait;ROM;FMC;Flexibility;Pain;Vision;UE functional use;Endurance    Cognitive Skills Attention;Perception;Problem Solve;Safety Awareness;Thought    Rehab Potential Good    Clinical Decision Making Several treatment options, min-mod task modification necessary    Comorbidities Affecting Occupational Performance: None    Modification or Assistance to Complete Evaluation  Min-Moderate modification of tasks or assist with assess necessary to complete eval    OT Frequency 2x / week   plan to increase frequency end of NOV/ beginning of Dec to 3x week, will sent off new cert then   OT Duration 12 weeks    OT Treatment/Interventions Self-care/ADL training;Ultrasound;Energy conservation;Visual/perceptual remediation/compensation;Patient/family education;DME and/or AE instruction;Aquatic Therapy;Paraffin;Gait Training;Passive range of motion;Balance training;Fluidtherapy;Electrical Stimulation;Functional Mobility Training;Splinting;Moist Heat;Therapeutic exercise;Manual Therapy;Cognitive remediation/compensation;Manual lymph drainage;Neuromuscular education;Coping strategies training    Plan  address RUE shoulder pain, heat to R shoulder prior to gentle mobilization, edema management to hand, simple ADLs.    Consulted and Agree with Plan of Care Patient;Family member/caregiver    Family Member Consulted spouse present           Patient will benefit from skilled therapeutic intervention in order to improve the following deficits and impairments:   Body Structure / Function / Physical Skills: ADL, Balance, Coordination, Decreased knowledge of precautions, Decreased knowledge of use of DME, Dexterity, Edema, Mobility, Tone, Strength, IADL, Sensation, GMC, Gait, ROM, FMC, Flexibility, Pain, Vision, UE functional use, Endurance Cognitive Skills: Attention, Perception, Problem Solve, Safety Awareness, Thought     Visit Diagnosis: Acute pain of right shoulder  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 GIB (gastrointestinal bleeding) 02/28/2020  . Elevated BUN   . Prediabetes   . 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[place]) 01/20/2020  . Cerebral vasospasm   . Sinus tachycardia   . Dysphagia, post-stroke   . Thrombocytopenia ([place])   . Acute blood loss anemia   . Brain aneurysm   . ICH (  intracerebral hemorrhage) ([place]) 01/05/2020    Carey Bullocks, OTR/L 03/28/2020, 2:11 PM  [facility] [address] [place], [place], [address] Phone: [phone]   Fax:  [phone]  Name: Erika Stanton MRN: [id number] Date of Birth: 1955/11/05

## 2020-03-28 NOTE — Therapy (Signed)
[facility] [address] [place], [place], [address] Phone: [phone]   Fax:  [phone]  Speech Language Pathology Treatment  Patient Details  Name: Erika Stanton MRN: [id number] Date of Birth: 1955/06/14 Referring Provider (SLP): Alger Simons, MD   Encounter Date: 03/28/2020   End of Session - 03/28/20 1608    Visit Number 9    Number of Visits 17    Date for SLP Re-Evaluation 05/27/20    SLP Start Time 1450    SLP Stop Time  1530    SLP Time Calculation (min) 40 min    Activity Tolerance Patient tolerated treatment well           Past Medical History:  Diagnosis Date  . Melena     Past Surgical History:  Procedure Laterality Date  . CHOLECYSTECTOMY    . COLONOSCOPY  11/2017   at [facility]. no recurrent polyps.  suggest repeat surveillance study 11/2022.    Marland Kitchen COLONOSCOPY W/ POLYPECTOMY  06/2014   Dr Roxy Manns at [facility].  3 adenomatoous polyps, anal fissure.    . ESOPHAGOGASTRODUODENOSCOPY (EGD) WITH PROPOFOL N/A 01/27/2020   Procedure: ESOPHAGOGASTRODUODENOSCOPY (EGD) WITH PROPOFOL;  Surgeon: Mauri Pole, MD;  Location: [facility];  Service: Endoscopy;  Laterality: N/A;  . IR 3D INDEPENDENT WKST  01/06/2020  . IR ANGIO INTRA EXTRACRAN SEL INTERNAL CAROTID BILAT MOD SED  01/06/2020  . IR ANGIO VERTEBRAL SEL VERTEBRAL UNI L MOD SED  01/06/2020  . IR ANGIOGRAM FOLLOW UP STUDY  01/06/2020  . IR ANGIOGRAM FOLLOW UP STUDY  01/06/2020  . IR ANGIOGRAM FOLLOW UP STUDY  01/06/2020  . IR ANGIOGRAM FOLLOW UP STUDY  01/06/2020  . IR ANGIOGRAM FOLLOW UP STUDY  01/06/2020  . IR ANGIOGRAM FOLLOW UP STUDY  01/06/2020  . IR ANGIOGRAM FOLLOW UP STUDY  01/06/2020  . IR ANGIOGRAM FOLLOW UP STUDY  01/06/2020  . IR ANGIOGRAM FOLLOW UP STUDY  01/06/2020  . IR ANGIOGRAM FOLLOW UP STUDY  01/06/2020  . IR NEURO EACH ADD'L AFTER BASIC UNI RIGHT (MS)  01/06/2020  . IR TRANSCATH/EMBOLIZ  01/06/2020  . RADIOLOGY WITH ANESTHESIA N/A  01/06/2020   Procedure: IR WITH ANESTHESIA FOR ANEURYSM;  Surgeon: Consuella Lose, MD;  Location: [facility];  Service: Radiology;  Laterality: N/A;  . TUBAL LIGATION      There were no vitals filed for this visit.   Subjective Assessment - 03/28/20 1500    Subjective Pt asked SLP if he re-ordered her Talk Path.    Currently in Pain? Yes    Pain Score 2     Pain Location Arm    Pain Orientation Right    Pain Descriptors / Indicators Aching    Pain Type Chronic pain                 ADULT SLP TREATMENT - 03/28/20 1508      General Information   Behavior/Cognition Alert;Cooperative;Impulsive;Pleasant mood      Treatment Provided   Treatment provided Cognitive-Linquistic      Dysphagia Treatment   Other treatment/comments Pt has been eating nuts during the weekend. "I got some cashews! I love them."      Cognitive-Linquistic Treatment   Treatment focused on Cognition;Patient/family/caregiver education    Skilled Treatment Pt had not done > 10 minutes of talk path since Thursday - SLP observed pt's progress and reworked her exercises. 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 With sequences of 6-steps pt sorted cards with distractor (radio on) not noticably hindering pt. As sequences got more detailed visually, pt req'd more time and the last two sequences req'd SLP min-mod cues for errors that pt did not notice.      Assessment / Recommendations / Plan   Plan Continue with current plan of care      Progression Toward Goals   Progression toward goals Progressing toward goals              SLP Short Term Goals - 03/28/20 1609      SLP SHORT TERM GOAL #1   Title pt to complete cognitive linguistic testing    Period --   or 4 total sessions   Status Achieved      SLP SHORT TERM GOAL #2   Title pt will demo emergent awareness 100% with min complex therapy tasks x3 visits    Time 1     Period Weeks    Status On-going      SLP SHORT TERM GOAL #3   Title pt will demo appriopriate aspiration precuations with suggested POs x3 sessions    Status Deferred      SLP SHORT TERM GOAL #4   Title pt will solve household probelms using functional solutions x3 sessions    Time 1    Period Weeks    Status On-going      SLP SHORT TERM GOAL #5   Title pt will demo selective attention (internal distraction) to a simple functional task for 10 minutes with rare min A back to task in 3 sessions    Baseline 03-28-20    Time 1    Period Weeks    Status On-going            SLP Long Term Goals - 03/28/20 1610      SLP LONG TERM GOAL #1   Title pt will demo anticipatory awareness by double checking all work x5 sessions    Time 5    Period Weeks   or 17 total sessions, for all LTGs   Status On-going      SLP LONG TERM GOAL #2   Title pt will follow swallow precautions with POs with modified independence x3 sessions    Status Deferred      SLP LONG TERM GOAL #3   Title pt will demo [facility] skills in solving mod complex problems in WNL amount of time with modified independence (double checking answers , etc)    Time 5    Period Weeks    Status On-going      SLP LONG TERM GOAL #4   Title pt will demo altaernating attention in simple functional cognitive linguistic tasks in 3 sessions    Time 5    Status On-going            Plan - 03/28/20 1608    Clinical Impression Statement Erika Stanton impulsivity appears decr'd today, compared to last week. Her attention (selective) also appears to be improving. Awareness and simple executive function (problem solving) deficits remain apparent. 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 She is leaning  towards early retirement.    Speech Therapy Frequency 2x / week  Duration 8 weeks   or 17 visits   Treatment/Interventions Aspiration precaution training;Pharyngeal strengthening exercises;Compensatory techniques;Diet toleration management by SLP;Trials of upgraded texture/liquids;Cueing hierarchy;Cognitive reorganization;Internal/external aids;Patient/family education;SLP instruction and feedback;Functional tasks    Potential to Achieve Goals Good           Patient will benefit from skilled therapeutic intervention in order to improve the following deficits and impairments:   Cognitive communication deficit    Problem List Patient Active Problem List   Diagnosis Date Noted  . GIB (gastrointestinal bleeding) 02/28/2020  . Elevated BUN   . Prediabetes   . 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[place]) 01/20/2020  . Cerebral vasospasm   . Sinus tachycardia   . Dysphagia, post-stroke   . Thrombocytopenia ([place])   . Acute blood loss anemia   . Brain aneurysm   . ICH (intracerebral hemorrhage) ([place]) 01/05/2020    [facility] ,[place], [place]  03/28/2020, 4:11 PM  [facility] [address] [place], [place], [address] Phone: [phone]   Fax:  [phone]   Name: Erika Stanton MRN: [id number] Date of Birth: 09/01/1955

## 2020-03-30 ENCOUNTER — Ambulatory Visit: Payer: BC Managed Care – PPO

## 2020-03-30 ENCOUNTER — Other Ambulatory Visit: Payer: Self-pay

## 2020-03-30 ENCOUNTER — Ambulatory Visit: Payer: BC Managed Care – PPO | Admitting: Occupational Therapy

## 2020-03-30 DIAGNOSIS — R2689 Other abnormalities of gait and mobility: Secondary | ICD-10-CM | POA: Diagnosis not present

## 2020-03-30 DIAGNOSIS — I69159 Hemiplegia and hemiparesis following nontraumatic intracerebral hemorrhage affecting unspecified side: Secondary | ICD-10-CM

## 2020-03-30 DIAGNOSIS — M25511 Pain in right shoulder: Secondary | ICD-10-CM

## 2020-03-30 DIAGNOSIS — R41841 Cognitive communication deficit: Secondary | ICD-10-CM

## 2020-03-30 DIAGNOSIS — M79641 Pain in right hand: Secondary | ICD-10-CM

## 2020-03-30 DIAGNOSIS — M6281 Muscle weakness (generalized): Secondary | ICD-10-CM

## 2020-03-30 NOTE — Therapy (Signed)
[facility] [address] [place], [place], [address] Phone: [phone]   Fax:  [phone]  Physical Therapy Treatment  Patient Details  Name: Erika Stanton MRN: [id number] Date of Birth: 1955-10-10 Referring Provider (PT): Alger Simons, MD   Encounter Date: 03/30/2020   PT End of Session - 03/30/20 1017    Visit Number 14    Number of Visits 21    Date for PT Re-Evaluation 04/25/20    Authorization Type [employer]BS State    PT Start Time 1020   pt had to use restroom prior to session   PT Stop Time 1058    PT Time Calculation (min) 38 min    Equipment Utilized During Treatment Gait belt    Activity Tolerance Patient tolerated treatment well    Behavior During Therapy [facility] for tasks assessed/performed           Past Medical History:  Diagnosis Date  . Melena     Past Surgical History:  Procedure Laterality Date  . CHOLECYSTECTOMY    . COLONOSCOPY  11/2017   at [facility]. no recurrent polyps.  suggest repeat surveillance study 11/2022.    Marland Kitchen COLONOSCOPY W/ POLYPECTOMY  06/2014   Dr Roxy Manns at [facility].  3 adenomatoous polyps, anal fissure.    . ESOPHAGOGASTRODUODENOSCOPY (EGD) WITH PROPOFOL N/A 01/27/2020   Procedure: ESOPHAGOGASTRODUODENOSCOPY (EGD) WITH PROPOFOL;  Surgeon: Mauri Pole, MD;  Location: [facility];  Service: Endoscopy;  Laterality: N/A;  . IR 3D INDEPENDENT WKST  01/06/2020  . IR ANGIO INTRA EXTRACRAN SEL INTERNAL CAROTID BILAT MOD SED  01/06/2020  . IR ANGIO VERTEBRAL SEL VERTEBRAL UNI L MOD SED  01/06/2020  . IR ANGIOGRAM FOLLOW UP STUDY  01/06/2020  . IR ANGIOGRAM FOLLOW UP STUDY  01/06/2020  . IR ANGIOGRAM FOLLOW UP STUDY  01/06/2020  . IR ANGIOGRAM FOLLOW UP STUDY  01/06/2020  . IR ANGIOGRAM FOLLOW UP STUDY  01/06/2020  . IR ANGIOGRAM FOLLOW UP STUDY  01/06/2020  . IR ANGIOGRAM FOLLOW UP STUDY  01/06/2020  . IR ANGIOGRAM FOLLOW UP STUDY  01/06/2020  . IR ANGIOGRAM FOLLOW UP STUDY  01/06/2020  . IR  ANGIOGRAM FOLLOW UP STUDY  01/06/2020  . IR NEURO EACH ADD'L AFTER BASIC UNI RIGHT (MS)  01/06/2020  . IR TRANSCATH/EMBOLIZ  01/06/2020  . RADIOLOGY WITH ANESTHESIA N/A 01/06/2020   Procedure: IR WITH ANESTHESIA FOR ANEURYSM;  Surgeon: Consuella Lose, MD;  Location: [facility];  Service: Radiology;  Laterality: N/A;  . TUBAL LIGATION      There were no vitals filed for this visit.   Subjective Assessment - 03/30/20 1018    Subjective Pt has both thumb splints donned today. Reports she is doing ok.    Limitations Walking;Standing;House hold activities    Patient Stated Goals Use my right hand like I used to.    Currently in Pain? Yes    Pain Score 2     Pain Location Arm    Pain Orientation Right    Pain Descriptors / Indicators Aching    Pain Type Chronic pain    Pain Onset More than a month ago    Pain Frequency Intermittent    Aggravating Factors  movement    Pain Relieving Factors rest                             [facility] Adult PT Treatment/Exercise - 03/30/20 1022  Ambulation/Gait   Ambulation/Gait Yes    Ambulation/Gait Assistance 5: Supervision    Ambulation/Gait Assistance Details Pt was given verbal cues to try to relax right arm and increased step length.    Ambulation Distance (Feet) 850 Feet    Assistive device None    Gait Pattern Step-through pattern;Decreased arm swing - right    Ambulation Surface Level;Unlevel;Indoor;Outdoor;Paved;Grass      Neuro Re-ed    Neuro Re-ed Details  Seated on blue physioball: sit to stands with PT supporting right arm at elbow and forearm to try to facilitate some WB through right arm. Pt reported some pain in right shoulder. Performed x 6. Then marching with cues to sit up tall 10 x 2 CGA with PT helping to stabilize the ball as well. Pt had decreased stability when lifting left leg. Pelvic tilts ant/post and lateral x 10 each with verbal cues and demonstration for form. Stepping over 5 cones with tapping with each foot  first then reciprocal steps x 4 bouts each with CGA. Standing on airex at bottom of steps: Attempted to place right forearm on railing to be able to get some weight bearing through arm and slide up rail slightly but pt did not tolerate even with max support. Standing on airex with alternating toe taps on bottom step x 10 with verbal cues to tighten bottom to support stance leg, standing feet together with eyes open x 30 sec then eyes closed x 30 sec CGA with increased sway. Feet apart on airex with head turns left/right and up/down x 10 CGA. Tandem walking along counter with light LUE support supervision one direction then no UE support he other direction CGA x 3 bouts each direction.                    PT Short Term Goals - 03/23/20 1335      PT SHORT TERM GOAL #1   Title Pt/spouse will be independent with initial HEP in order to indicate decreased fall risk and improved functional mobility.  (Target Date: 03/26/20)    Baseline performing most of HEP 03/21/20    Time 4    Period Weeks    Status Achieved    Target Date 03/26/20      PT SHORT TERM GOAL #2   Title Pt will complete BERG balance test and improve score by 6 points from baseline in order to indicate decreased fall risk.    Baseline 46/56 on 03/01/20; 54/56 03/21/20    Time 4    Period Weeks    Status Achieved      PT SHORT TERM GOAL #3   Title Pt will improve 5TSS to </=25 secs with single UE support in order to indicate dec fall risk and improved functional strength.    Baseline 16 sec with one UE (03/21/20)    Time 4    Period Weeks    Status Achieved      PT SHORT TERM GOAL #4   Title Pt will improve gait speed to >/=2.4 ft/sec with LRAD in order to indicate dec fall risk.    Baseline 03/23/20 3.21 ft/sec    Time 4    Period Weeks    Status Achieved      PT SHORT TERM GOAL #5   Title Pt will ambulate x 12' at S level with LRAD in simulated household environment in order to indicate improved independence in her  home.    Baseline 1050' without AD  Time 4    Period Weeks    Status Achieved             PT Long Term Goals - 03/23/20 1422      PT LONG TERM GOAL #1   Title Pt will be independent with progressive HEP for balance and strengthening to continue gains on own.    Time 8    Period Weeks    Status Revised    Target Date 04/25/20      PT LONG TERM GOAL #2   Title Pt will increase FGA from 19/30 to >24/30 for improved balance and gait to decrease fall risk.    Baseline 03/23/20 19/30    Time 8    Period Weeks    Status New    Target Date 04/25/20      PT LONG TERM GOAL #3   Title Pt will perform 5TSS in </=13 secs without UE support in order to indicate improved functional strength    Baseline 16 seconds with L UE support 03/21/20    Time 8    Period Weeks    Status Revised    Target Date 04/25/20      PT LONG TERM GOAL #4   Title Pt will ambulate >1000' on varied surfaces independently with no LOB for improved community ambulation without AD.    Baseline 1050' including >300 feet of grass surface with SBA 03/21/20; we worked on ramp and curb also.    Time 8    Period Weeks    Status Revised    Target Date 04/25/20                 Plan - 03/30/20 1108    Clinical Impression Statement Pt refused to try tall kneeling of modified quadruped today. Continues ot have decreased tolerance when trying to incorporate right arm in to activities. Pt was able to show improving stability with gait with right arm relaxed at side.    Personal Factors and Comorbidities Age;Comorbidity 1;Profession    Comorbidities B ICHs    Examination-Activity Limitations Bend;Carry;Lift;Locomotion Level;Reach Overhead;Squat;Stairs;Stand;Transfers    Examination-Participation Restrictions Cleaning;Community Activity;Driving;Occupation;Yard Work;Laundry;Meal Prep    Stability/Clinical Decision Making Evolving/Moderate complexity    Rehab Potential Good    PT Frequency 3x / week   then 2x/wk for  4 weeks   PT Duration 4 weeks   then 2x/wk for 4 weeks   PT Treatment/Interventions ADLs/Self Care Home Management;Aquatic Therapy;Electrical Stimulation;DME Instruction;Gait training;Stair training;Functional mobility training;Therapeutic activities;Therapeutic exercise;Balance training;Neuromuscular re-education;Cognitive remediation;Patient/family education;Orthotic Fit/Training;Manual techniques;Passive range of motion;Dry needling;Energy conservation;Vestibular;Visual/perceptual remediation/compensation    PT Next Visit Plan Continue with dynamic gait and balance activities. Activities to try to incorporate more core and shoulder activation as able, LLE NMR. Can try seated on physioball or tall kneeling again.    Consulted and Agree with Plan of Care Patient;Family member/caregiver           Patient will benefit from skilled therapeutic intervention in order to improve the following deficits and impairments:  Abnormal gait, Decreased activity tolerance, Decreased balance, Decreased cognition, Decreased endurance, Decreased knowledge of use of DME, Decreased mobility, Decreased strength, Impaired perceived functional ability, Impaired flexibility, Impaired sensation, Postural dysfunction, Improper body mechanics, Impaired vision/preception, Impaired UE functional use, Impaired tone  Visit Diagnosis: Other abnormalities of gait and mobility  Muscle weakness (generalized)     Problem List Patient Active Problem List   Diagnosis Date Noted  . GIB (gastrointestinal bleeding) 02/28/2020  . Elevated BUN   .  Prediabetes   . 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[place]) 01/20/2020  . Cerebral vasospasm   . Sinus tachycardia   . Dysphagia, post-stroke   . Thrombocytopenia ([place])   . Acute blood loss anemia   . Brain aneurysm   . ICH (intracerebral hemorrhage) ([place]) 01/05/2020    Electa Sniff , PT, DPT, NCS 03/30/2020, 11:49 AM  [facility] [address] [place], [place], [address] Phone: [phone]   Fax:  [phone]  Name: Erika Stanton MRN: [id number] Date of Birth: November 04, 1955

## 2020-03-30 NOTE — Therapy (Signed)
[facility] [address] [place], [place], [address] Phone: [phone]   Fax:  [phone]  Occupational Therapy Treatment  Patient Details  Name: Erika Stanton MRN: [id number] Date of Birth: 1956/02/09 Referring Provider (OT): Dr. Naaman Plummer   Encounter Date: 03/30/2020   OT End of Session - 03/30/20 1139    Visit Number 8    Number of Visits 25    Date for OT Re-Evaluation 05/25/20    Authorization Time Period State BCBS    OT Start Time 1105    OT Stop Time 1145    OT Time Calculation (min) 40 min    Activity Tolerance Patient tolerated treatment well    Behavior During Therapy [facility] for tasks assessed/performed           Past Medical History:  Diagnosis Date  . Melena     Past Surgical History:  Procedure Laterality Date  . CHOLECYSTECTOMY    . COLONOSCOPY  11/2017   at [facility]. no recurrent polyps.  suggest repeat surveillance study 11/2022.    Marland Kitchen COLONOSCOPY W/ POLYPECTOMY  06/2014   Dr Roxy Manns at [facility].  3 adenomatoous polyps, anal fissure.    . ESOPHAGOGASTRODUODENOSCOPY (EGD) WITH PROPOFOL N/A 01/27/2020   Procedure: ESOPHAGOGASTRODUODENOSCOPY (EGD) WITH PROPOFOL;  Surgeon: Mauri Pole, MD;  Location: [facility];  Service: Endoscopy;  Laterality: N/A;  . IR 3D INDEPENDENT WKST  01/06/2020  . IR ANGIO INTRA EXTRACRAN SEL INTERNAL CAROTID BILAT MOD SED  01/06/2020  . IR ANGIO VERTEBRAL SEL VERTEBRAL UNI L MOD SED  01/06/2020  . IR ANGIOGRAM FOLLOW UP STUDY  01/06/2020  . IR ANGIOGRAM FOLLOW UP STUDY  01/06/2020  . IR ANGIOGRAM FOLLOW UP STUDY  01/06/2020  . IR ANGIOGRAM FOLLOW UP STUDY  01/06/2020  . IR ANGIOGRAM FOLLOW UP STUDY  01/06/2020  . IR ANGIOGRAM FOLLOW UP STUDY  01/06/2020  . IR ANGIOGRAM FOLLOW UP STUDY  01/06/2020  . IR ANGIOGRAM FOLLOW UP STUDY  01/06/2020  . IR ANGIOGRAM FOLLOW UP STUDY  01/06/2020  . IR ANGIOGRAM FOLLOW UP STUDY  01/06/2020  . IR NEURO EACH ADD'L AFTER BASIC UNI RIGHT (MS)   01/06/2020  . IR TRANSCATH/EMBOLIZ  01/06/2020  . RADIOLOGY WITH ANESTHESIA N/A 01/06/2020   Procedure: IR WITH ANESTHESIA FOR ANEURYSM;  Surgeon: Consuella Lose, MD;  Location: [facility];  Service: Radiology;  Laterality: N/A;  . TUBAL LIGATION      There were no vitals filed for this visit.   Subjective Assessment - 03/30/20 1106    Pertinent History ICH 01/06/20, Aneurysm. PMH: OA bilateral knees and hands    Currently in Pain? Yes    Pain Score 2    increases w/ motion   Pain Location Arm    Pain Orientation Right    Pain Descriptors / Indicators Aching    Pain Type Chronic pain    Pain Onset More than a month ago    Pain Frequency Intermittent    Aggravating Factors  movement    Pain Relieving Factors rest            Passive flexion and extension Rt hand while hot pack on Rt shoulder. Emphasized maintaining ROM Rt hand to prevent/manage stiffness and pain.  Practiced donning/doffing jacket w/ min assist to doff off elbow and start zipper. Also verbally reviewed donning/doffing pull over shirt (pt did not have to practice today) NMES x 15 min alternating wrist/finger extensors and finger flexors at 50 pps, 250 pw,  10 sec. On/off cycle, Int = 30 for extensors, Int = 19 for flexors.                       OT Short Term Goals - 03/21/20 1430      OT SHORT TERM GOAL #1   Title I with initialHEP.    Time 6    Period Weeks    Status Achieved    Target Date 04/13/20      OT SHORT TERM GOAL #2   Title Pt will perform bathing with min A.    Time 6    Period Weeks    Status New      OT SHORT TERM GOAL #3   Title Pt will donn shirt and pants with min A    Time 6    Period Weeks    Status New      OT SHORT TERM GOAL #4   Title Pt will demonstrate 30 A/ROM shoulder flexion for RUE with pain less than or equal to 4/10.    Time 6    Period Weeks    Status New      OT SHORT TERM GOAL #5   Title Pt will  demonstrate improved LUE fine motor coordination for  ADLs as evidenced by decreasing 9 hole peg test score to 45 secs or less.    Time 6    Period Weeks    Status New      OT SHORT TERM GOAL #6   Title Pt/ caregiver will verbalize understanding of RUE positioning and edema control techniques to minimize pain and risk for injury    Time 6    Period Weeks    Status New      OT SHORT TERM GOAL #7   Title Pt will demonstrate at least 25% finger flexion/ extension in RUE in prep for functional use.    Time 6    Period Weeks    Status New             OT Long Term Goals - 03/02/20 1527      OT LONG TERM GOAL #1   Title Pt will perfrom  all basic ADLS with supervision    Time 12    Period Weeks    Status New      OT LONG TERM GOAL #2   Title Pt will demonstrate 45* RUE shoulder flexion in prep for functional reach with pain less than or equal to 3/10.    Time 12    Period Weeks    Status New      OT LONG TERM GOAL #3   Title Pt will use RUE as a stabilizer/ gross A at least 30 % of the time for ADLs/ IADLs.    Time 12    Period Weeks    Status New      OT LONG TERM GOAL #4   Title Pt will increase LUE grip strength to 25 lbs or greater for increased ease with ADLs.    Time 12    Period Weeks    Status New      OT LONG TERM GOAL #5   Title Pt will perform simple home management/ snack prep with min A.    Time 12    Period Weeks    Status New      Long Term Additional Goals   Additional Long Term Goals Yes      OT LONG TERM  GOAL #6   Title Pt will demonstrate ability to grasp/ release a cup 2/3 trials with RUE.    Time 12    Period Weeks    Status New      OT LONG TERM GOAL #7   Title Pt will safely navigate a busy environment and locate items with supervision and  90% or greater accuracy without bumping into items.    Time 12    Period Weeks    Status New      OT LONG TERM GOAL #8   Title I with updated HEP.    Time 12    Period Weeks    Status New                 Plan - 03/30/20 1143     Clinical Impression Statement Pt tolerating estim to wrist/fingers. Pt with increased independence donning/doffing jacket style shirt    OT Occupational Profile and History Detailed Assessment- Review of Records and additional review of physical, cognitive, psychosocial history related to current functional performance    Occupational performance deficits (Please refer to evaluation for details): ADL's;IADL's;Rest and Sleep;Work;Play;Leisure    Body Structure / Function / Physical Skills ADL;Balance;Coordination;Decreased knowledge of precautions;Decreased knowledge of use of DME;Dexterity;Edema;Mobility;Tone;Strength;IADL;Sensation;GMC;Gait;ROM;FMC;Flexibility;Pain;Vision;UE functional use;Endurance    Cognitive Skills Attention;Perception;Problem Solve;Safety Awareness;Thought    Rehab Potential Good    Clinical Decision Making Several treatment options, min-mod task modification necessary    Comorbidities Affecting Occupational Performance: None    Modification or Assistance to Complete Evaluation  Min-Moderate modification of tasks or assist with assess necessary to complete eval    OT Frequency 2x / week   plan to increase frequency end of NOV/ beginning of Dec to 3x week, will sent off new cert then   OT Duration 12 weeks    OT Treatment/Interventions Self-care/ADL training;Ultrasound;Energy conservation;Visual/perceptual remediation/compensation;Patient/family education;DME and/or AE instruction;Aquatic Therapy;Paraffin;Gait Training;Passive range of motion;Balance training;Fluidtherapy;Electrical Stimulation;Functional Mobility Training;Splinting;Moist Heat;Therapeutic exercise;Manual Therapy;Cognitive remediation/compensation;Manual lymph drainage;Neuromuscular education;Coping strategies training    Plan address RUE shoulder pain, heat to R shoulder prior to gentle mobilization, passive and active finger flex/ext    Consulted and Agree with Plan of Care Patient;Family member/caregiver     Family Member Consulted spouse present           Patient will benefit from skilled therapeutic intervention in order to improve the following deficits and impairments:   Body Structure / Function / Physical Skills: ADL, Balance, Coordination, Decreased knowledge of precautions, Decreased knowledge of use of DME, Dexterity, Edema, Mobility, Tone, Strength, IADL, Sensation, GMC, Gait, ROM, FMC, Flexibility, Pain, Vision, UE functional use, Endurance Cognitive Skills: Attention, Perception, Problem Solve, Safety Awareness, Thought     Visit Diagnosis: Acute pain of right shoulder  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 GIB (gastrointestinal bleeding) 02/28/2020  . Elevated BUN   . Prediabetes   . 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[place]) 01/20/2020  . Cerebral vasospasm   . Sinus tachycardia   . Dysphagia, post-stroke   . Thrombocytopenia ([place])   . Acute blood loss anemia   . Brain aneurysm   . ICH (intracerebral hemorrhage) ([place]) 01/05/2020    Carey Bullocks, OTR/L 03/30/2020, 2:33 PM  [facility] [address] [place], [place], [address] Phone: [phone]   Fax:  [phone]  Name: Erika Stanton MRN: [id number] Date  of Birth: 06/21/1955

## 2020-03-30 NOTE — Therapy (Signed)
[facility] [address] [place], [place], [address] Phone: [phone]   Fax:  [phone]  Speech Language Pathology Treatment  Patient Details  Name: Erika Stanton MRN: [id number] Date of Birth: December 11, 1955 Referring Provider (SLP): Alger Simons, MD   Encounter Date: 03/30/2020   End of Session - 03/30/20 1305    Visit Number 10    Number of Visits 17    Date for SLP Re-Evaluation 05/27/20    SLP Start Time 0933    SLP Stop Time  1017    SLP Time Calculation (min) 44 min    Activity Tolerance Patient tolerated treatment well           Past Medical History:  Diagnosis Date  . Melena     Past Surgical History:  Procedure Laterality Date  . CHOLECYSTECTOMY    . COLONOSCOPY  11/2017   at [facility] no recurrent polyps.  suggest repeat surveillance study 11/2022.    Marland Kitchen COLONOSCOPY W/ POLYPECTOMY  06/2014   Dr Roxy Manns at [facility].  3 adenomatoous polyps, anal fissure.    . ESOPHAGOGASTRODUODENOSCOPY (EGD) WITH PROPOFOL N/A 01/27/2020   Procedure: ESOPHAGOGASTRODUODENOSCOPY (EGD) WITH PROPOFOL;  Surgeon: Mauri Pole, MD;  Location: [facility];  Service: Endoscopy;  Laterality: N/A;  . IR 3D INDEPENDENT WKST  01/06/2020  . IR ANGIO INTRA EXTRACRAN SEL INTERNAL CAROTID BILAT MOD SED  01/06/2020  . IR ANGIO VERTEBRAL SEL VERTEBRAL UNI L MOD SED  01/06/2020  . IR ANGIOGRAM FOLLOW UP STUDY  01/06/2020  . IR ANGIOGRAM FOLLOW UP STUDY  01/06/2020  . IR ANGIOGRAM FOLLOW UP STUDY  01/06/2020  . IR ANGIOGRAM FOLLOW UP STUDY  01/06/2020  . IR ANGIOGRAM FOLLOW UP STUDY  01/06/2020  . IR ANGIOGRAM FOLLOW UP STUDY  01/06/2020  . IR ANGIOGRAM FOLLOW UP STUDY  01/06/2020  . IR ANGIOGRAM FOLLOW UP STUDY  01/06/2020  . IR ANGIOGRAM FOLLOW UP STUDY  01/06/2020  . IR ANGIOGRAM FOLLOW UP STUDY  01/06/2020  . IR NEURO EACH ADD'L AFTER BASIC UNI RIGHT (MS)  01/06/2020  . IR TRANSCATH/EMBOLIZ  01/06/2020  . RADIOLOGY WITH ANESTHESIA N/A  01/06/2020   Procedure: IR WITH ANESTHESIA FOR ANEURYSM;  Surgeon: Consuella Lose, MD;  Location: [facility];  Service: Radiology;  Laterality: N/A;  . TUBAL LIGATION      There were no vitals filed for this visit.          ADULT SLP TREATMENT - 03/30/20 1144      General Information   Behavior/Cognition Alert;Cooperative;Impulsive;Pleasant mood      Treatment Provided   Treatment provided Cognitive-Linquistic      Cognitive-Linquistic Treatment   Treatment focused on Cognition    Skilled Treatment Pt appears slightly less impulsvie than last session/s. She completed a sustained/selective attention task today and did not use a strategy to keep her place so had difficulty 70% into the task; Required SLP total A to think of a way to keep her place. After this cue from SLP however pt cont'd without cues. SLP was able to have two 6-7 minute conversations with highly salient topics and maintained topic appropriately for each conversation.       Assessment / Recommendations / Plan   Plan Continue with current plan of care      Progression Toward Goals   Progression toward goals Progressing toward goals              SLP Short Term Goals - 03/30/20 1307  SLP SHORT TERM GOAL #1   Title pt to complete cognitive linguistic testing    Period --   or 4 total sessions   Status Achieved      SLP SHORT TERM GOAL #2   Title pt will demo emergent awareness 100% with min complex therapy tasks x3 visits    Baseline 03-30-20    Status Partially Met      SLP SHORT TERM GOAL #3   Title pt will demo appriopriate aspiration precuations with suggested POs x3 sessions    Status Deferred      SLP SHORT TERM GOAL #4   Title pt will solve household probelms using functional solutions x3 sessions    Baseline 03-30-20    Status On-going      SLP SHORT TERM GOAL #5   Title pt will demo selective attention (internal distraction) to a simple functional task for 10 minutes with rare min A  back to task in 3 sessions    Baseline 03-28-20    Period Weeks    Status Partially Met            SLP Long Term Goals - 03/30/20 1308      SLP LONG TERM GOAL #1   Title pt will demo anticipatory awareness by double checking all work x5 sessions    Time 5    Period Weeks   or 17 total sessions, for all LTGs   Status On-going      SLP LONG TERM GOAL #2   Title pt will follow swallow precautions with POs with modified independence x3 sessions    Status Deferred      SLP LONG TERM GOAL #3   Title pt will demo [facility] skills in solving mod complex problems in WNL amount of time with modified independence (double checking answers , etc)    Time 5    Period Weeks    Status On-going      SLP LONG TERM GOAL #4   Title pt will demo altaernating attention in simple functional cognitive linguistic tasks in 3 sessions    Time 5    Status On-going            Plan - 03/30/20 1307    Clinical Impression Statement Erika Stanton's impulsivity appears decr'd today, compared to last week. Her attention (selective) also appears to be improving. Awareness and simple executive function (problem solving) deficits remain apparent. 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 She is leaning towards early retirement.    Speech Therapy Frequency 2x / week    Duration 8 weeks   or 17 visits   Treatment/Interventions Aspiration precaution training;Pharyngeal strengthening exercises;Compensatory techniques;Diet toleration management by SLP;Trials of upgraded texture/liquids;Cueing hierarchy;Cognitive reorganization;Internal/external aids;Patient/family education;SLP instruction and feedback;Functional tasks    Potential to Achieve Goals Good           Patient will benefit  from skilled therapeutic intervention in order to improve the following deficits and impairments:   Cognitive communication deficit    Problem List Patient Active Problem List   Diagnosis Date Noted  . GIB (gastrointestinal bleeding) 02/28/2020  . Elevated BUN   . Prediabetes   . 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[place]) 01/20/2020  . Cerebral vasospasm   . Sinus tachycardia   .  Dysphagia, post-stroke   . Thrombocytopenia ([place])   . Acute blood loss anemia   . Brain aneurysm   . ICH (intracerebral hemorrhage) ([place]) 01/05/2020    [facility], [place]  03/30/2020, 1:08 PM  [facility] [address] [place], [place], [address] Phone: [phone]   Fax:  [phone]   Name: Erika Stanton MRN: [id number] Date of Birth: Jun 03, 1955

## 2020-04-01 ENCOUNTER — Ambulatory Visit: Payer: BC Managed Care – PPO

## 2020-04-04 ENCOUNTER — Ambulatory Visit: Payer: BC Managed Care – PPO | Admitting: Physical Therapy

## 2020-04-04 ENCOUNTER — Other Ambulatory Visit: Payer: Self-pay

## 2020-04-04 ENCOUNTER — Encounter: Payer: Self-pay | Admitting: Occupational Therapy

## 2020-04-04 ENCOUNTER — Ambulatory Visit: Payer: BC Managed Care – PPO | Admitting: Occupational Therapy

## 2020-04-04 ENCOUNTER — Encounter: Payer: Self-pay | Admitting: Physical Therapy

## 2020-04-04 DIAGNOSIS — M6281 Muscle weakness (generalized): Secondary | ICD-10-CM

## 2020-04-04 DIAGNOSIS — M79641 Pain in right hand: Secondary | ICD-10-CM

## 2020-04-04 DIAGNOSIS — R2689 Other abnormalities of gait and mobility: Secondary | ICD-10-CM

## 2020-04-04 DIAGNOSIS — R2681 Unsteadiness on feet: Secondary | ICD-10-CM

## 2020-04-04 DIAGNOSIS — I69159 Hemiplegia and hemiparesis following nontraumatic intracerebral hemorrhage affecting unspecified side: Secondary | ICD-10-CM

## 2020-04-04 DIAGNOSIS — R41844 Frontal lobe and executive function deficit: Secondary | ICD-10-CM

## 2020-04-04 DIAGNOSIS — M25511 Pain in right shoulder: Secondary | ICD-10-CM

## 2020-04-04 NOTE — Patient Instructions (Signed)
Your Splint This splint should initially be fitted by a healthcare practitioner.  The healthcare practitioner is responsible for providing wearing instructions and precautions to the patient, other healthcare practitioners and care provider involved in the patient's care.  This splint was custom made for you. Please read the following instructions to learn about wearing and caring for your splint.  Precautions Should your splint cause any of the following problems, remove the splint immediately and contact your therapist/physician.   Swelling  Severe Pain  Pressure Areas  Stiffness  Numbness    When To Wear Your Splint Where your splint according to your therapist/physician instructions. Daytime for 1 hours then remove splint check skin for pressure areas or redness, if no problems progress to wearing 2-3 hours at a time during the day. If any problems stop wearing splint, remove and bring to next visit.  Care and Cleaning of Your Splint 1. Keep your splint away from open flames. 2. Your splint will lose its shape in temperatures over 135 degrees Farenheit, ( in car windows, near radiators, ovens or in hot water).  Never make any adjustments to your splint, if the splint needs adjusting remove it and make an appointment to see your therapist. 3. Your splint, including the cushion liner may be cleaned with soap and lukewarm water of wiped out with rubbing alcohol. Do not immerse in hot water  4. Straps may be washed with soap and water, but do not moisten the self-adhesive portion.

## 2020-04-04 NOTE — Therapy (Signed)
[facility] [address] [employer] [place], [place], [address] Phone: [phone]   Fax:  [phone]  Physical Therapy Treatment  Patient Details  Name: Erika Stanton MRN: [id number] Date of Birth: [birth year] Referring Provider (PT): Alger Simons, MD   Encounter Date: 04/04/2020   PT End of Session - 04/04/20 1237    Visit Number 15    Number of Visits 21    Date for PT Re-Evaluation 04/25/20    Authorization Type [employer] State    PT Start Time 1150    PT Stop Time 1230    PT Time Calculation (min) 40 min    Equipment Utilized During Treatment Gait belt    Activity Tolerance Patient tolerated treatment well    Behavior During Therapy [facility] for tasks assessed/performed           Past Medical History:  Diagnosis Date  . Melena     Past Surgical History:  Procedure Laterality Date  . CHOLECYSTECTOMY    . COLONOSCOPY  11/2017   at [facility]. no recurrent polyps.  suggest repeat surveillance study 11/2022.    Marland Kitchen COLONOSCOPY W/ POLYPECTOMY  06/2014   Dr Roxy Manns at [facility]  3 adenomatoous polyps, anal fissure.    . ESOPHAGOGASTRODUODENOSCOPY (EGD) WITH PROPOFOL N/A 01/27/2020   Procedure: ESOPHAGOGASTRODUODENOSCOPY (EGD) WITH PROPOFOL;  Surgeon: Mauri Pole, MD;  Location: [place] [facility];  Service: Endoscopy;  Laterality: N/A;  . IR 3D INDEPENDENT WKST  01/06/2020  . IR ANGIO INTRA EXTRACRAN SEL INTERNAL CAROTID BILAT MOD SED  01/06/2020  . IR ANGIO VERTEBRAL SEL VERTEBRAL UNI L MOD SED  01/06/2020  . IR ANGIOGRAM FOLLOW UP STUDY  01/06/2020  . IR ANGIOGRAM FOLLOW UP STUDY  01/06/2020  . IR ANGIOGRAM FOLLOW UP STUDY  01/06/2020  . IR ANGIOGRAM FOLLOW UP STUDY  01/06/2020  . IR ANGIOGRAM FOLLOW UP STUDY  01/06/2020  . IR ANGIOGRAM FOLLOW UP STUDY  01/06/2020  . IR ANGIOGRAM FOLLOW UP STUDY  01/06/2020  . IR ANGIOGRAM FOLLOW UP STUDY  01/06/2020  . IR ANGIOGRAM FOLLOW UP STUDY  01/06/2020  . IR ANGIOGRAM FOLLOW UP STUDY  01/06/2020  .  IR NEURO EACH ADD'L AFTER BASIC UNI RIGHT (MS)  01/06/2020  . IR TRANSCATH/EMBOLIZ  01/06/2020  . RADIOLOGY WITH ANESTHESIA N/A 01/06/2020   Procedure: IR WITH ANESTHESIA FOR ANEURYSM;  Surgeon: Consuella Lose, MD;  Location: [place];  Service: Radiology;  Laterality: N/A;  . TUBAL LIGATION      There were no vitals filed for this visit.   Subjective Assessment - 04/04/20 1154    Subjective Pt reports forgetting to wear her thumb braces today. Denies any falls since last visit.  Feels like her legs are stronger but still struggles with hand.    Limitations Walking;Standing;House hold activities    Patient Stated Goals Use my right hand like I used to.    Currently in Pain? Yes    Pain Score 1     Pain Location Shoulder    Pain Orientation Right    Pain Descriptors / Indicators Aching    Pain Type Chronic pain    Pain Onset More than a month ago    Pain Frequency Constant    Aggravating Factors  moving it    Pain Relieving Factors rest    Multiple Pain Sites Yes    Pain Score 1    Pain Location Hand    Pain Orientation Right    Pain Descriptors / Indicators  Aching    Pain Type Chronic pain    Pain Onset More than a month ago    Pain Frequency Constant    Aggravating Factors  moving it    Pain Relieving Factors rest                 [facility] Adult PT Treatment/Exercise - 04/04/20 0001      Transfers   Sit to Stand 6: Modified independent (Device/Increase time)    Stand to Sit 6: Modified independent (Device/Increase time)    Number of Reps 10 reps      Ambulation/Gait   Ambulation/Gait Yes    Ambulation/Gait Assistance 5: Supervision    Ambulation Distance (Feet) --   in/out of clinic   Assistive device None    Gait Pattern Step-through pattern;Decreased arm swing - right    Ambulation Surface Level;Indoor      High Level Balance   High Level Balance Activities Other (comment)    High Level Balance Comments Standing at steps for taps to 6" step x 20 each side then  taps to 6'", 12", 6", floot x 20 reps each side.  Needed 1 UE support for balance.  Stepping up/down single step with 1 UE assist x 10 both sides for balance activity and strength.  Taps, double taps and tipping/uprighting cones for balance.  Pt needs cues for increased hip flexion on the L to tap near top of cone.  Needs rest breaks in between sets.      Exercises   Exercises Knee/Hip      Knee/Hip Exercises: Supine   Bridges Both;1 set;10 reps    Other Supine Knee/Hip Exercises clams x 10 bil then single side x 10 x 2 sets with green theraband                    PT Short Term Goals - 03/23/20 1335      PT SHORT TERM GOAL #1   Title Pt/spouse will be independent with initial HEP in order to indicate decreased fall risk and improved functional mobility.  (Target Date: 03/26/20)    Baseline performing most of HEP 03/21/20    Time 4    Period Weeks    Status Achieved    Target Date 03/26/20      PT SHORT TERM GOAL #2   Title Pt will complete BERG balance test and improve score by 6 points from baseline in order to indicate decreased fall risk.    Baseline 46/56 on 03/01/20; 54/56 03/21/20    Time 4    Period Weeks    Status Achieved      PT SHORT TERM GOAL #3   Title Pt will improve 5TSS to </=25 secs with single UE support in order to indicate dec fall risk and improved functional strength.    Baseline 16 sec with one UE (03/21/20)    Time 4    Period Weeks    Status Achieved      PT SHORT TERM GOAL #4   Title Pt will improve gait speed to >/=2.4 ft/sec with LRAD in order to indicate dec fall risk.    Baseline 03/23/20 3.21 ft/sec    Time 4    Period Weeks    Status Achieved      PT SHORT TERM GOAL #5   Title Pt will ambulate x 49' at S level with LRAD in simulated household environment in order to indicate improved independence in her home.  Baseline 1050' without AD    Time 4    Period Weeks    Status Achieved             PT Long Term Goals - 03/23/20  1422      PT LONG TERM GOAL #1   Title Pt will be independent with progressive HEP for balance and strengthening to continue gains on own.    Time 8    Period Weeks    Status Revised    Target Date 04/25/20      PT LONG TERM GOAL #2   Title Pt will increase FGA from 19/30 to >24/30 for improved balance and gait to decrease fall risk.    Baseline 03/23/20 19/30    Time 8    Period Weeks    Status New    Target Date 04/25/20      PT LONG TERM GOAL #3   Title Pt will perform 5TSS in </=13 secs without UE support in order to indicate improved functional strength    Baseline 16 seconds with L UE support 03/21/20    Time 8    Period Weeks    Status Revised    Target Date 04/25/20      PT LONG TERM GOAL #4   Title Pt will ambulate >1000' on varied surfaces independently with no LOB for improved community ambulation without AD.    Baseline 1050' including >300 feet of grass surface with SBA 03/21/20; we worked on ramp and curb also.    Time 8    Period Weeks    Status Revised    Target Date 04/25/20                 Plan - 04/04/20 1237    Clinical Impression Statement Skilled session focused on balance, strength and coordination.  Pt with difficulty coordinating LLE at times. Improves with cues for awareness.  Cont per poc.    Personal Factors and Comorbidities Age;Comorbidity 1;Profession    Comorbidities B ICHs    Examination-Activity Limitations Bend;Carry;Lift;Locomotion Level;Reach Overhead;Squat;Stairs;Stand;Transfers    Examination-Participation Restrictions Cleaning;Community Activity;Driving;Occupation;Yard Work;Laundry;Meal Prep    Stability/Clinical Decision Making Evolving/Moderate complexity    Rehab Potential Good    PT Frequency 3x / week   then 2x/wk for 4 weeks   PT Duration 4 weeks   then 2x/wk for 4 weeks   PT Treatment/Interventions ADLs/Self Care Home Management;Aquatic Therapy;Electrical Stimulation;DME Instruction;Gait training;Stair  training;Functional mobility training;Therapeutic activities;Therapeutic exercise;Balance training;Neuromuscular re-education;Cognitive remediation;Patient/family education;Orthotic Fit/Training;Manual techniques;Passive range of motion;Dry needling;Energy conservation;Vestibular;Visual/perceptual remediation/compensation    PT Next Visit Plan Continue with dynamic gait and balance activities. Activities to try to incorporate more core and shoulder activation as able, LLE NMR. Can try seated on physioball or tall kneeling again.    Consulted and Agree with Plan of Care Patient;Family member/caregiver           Patient will benefit from skilled therapeutic intervention in order to improve the following deficits and impairments:  Abnormal gait, Decreased activity tolerance, Decreased balance, Decreased cognition, Decreased endurance, Decreased knowledge of use of DME, Decreased mobility, Decreased strength, Impaired perceived functional ability, Impaired flexibility, Impaired sensation, Postural dysfunction, Improper body mechanics, Impaired vision/preception, Impaired UE functional use, Impaired tone  Visit Diagnosis: Other abnormalities of gait and mobility  Muscle weakness (generalized)  Unsteadiness on feet     Problem List Patient Active Problem List   Diagnosis Date Noted  . GIB (gastrointestinal bleeding) 02/28/2020  . Elevated BUN   . Prediabetes   .  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 Cerebral vasospasm   . Sinus tachycardia   . Dysphagia, post-stroke   . Thrombocytopenia ([place])   . Acute blood loss anemia   . Brain aneurysm   . ICH (intracerebral hemorrhage) ([place]) 01/05/2020    Narda Bonds, PTA [place] 04/04/20 12:40 PM Phone: [phone] Fax: [facility] [address] [place], [place], [address] Phone: [phone]   Fax:   [phone]  Name: Erika Stanton MRN: [id number] Date of Birth: 1956-02-26

## 2020-04-04 NOTE — Therapy (Signed)
[name] [address] [place], [place], [address] Phone: [phone]   Fax:  [phone]  Occupational Therapy Treatment  Patient Details  Name: Erika Stanton MRN: [id number] Date of Birth: 06/12/1955 Referring Provider (OT): Dr. Naaman Plummer   Encounter Date: 04/04/2020   OT End of Session - 04/04/20 1554    Visit Number 9    Number of Visits 25    Date for OT Re-Evaluation 05/25/20    Authorization Time [place]    OT Start Time 6269    OT Stop Time 1315    OT Time Calculation (min) 39 min    Activity Tolerance Patient tolerated treatment well    Behavior During Therapy [facility] for tasks assessed/performed           Past Medical History:  Diagnosis Date  . Melena     Past Surgical History:  Procedure Laterality Date  . CHOLECYSTECTOMY    . COLONOSCOPY  11/2017   at [facility]. no recurrent polyps.  suggest repeat surveillance study 11/2022.    Marland Kitchen COLONOSCOPY W/ POLYPECTOMY  06/2014   Dr Roxy Manns at [facility].  3 adenomatoous polyps, anal fissure.    . ESOPHAGOGASTRODUODENOSCOPY (EGD) WITH PROPOFOL N/A 01/27/2020   Procedure: ESOPHAGOGASTRODUODENOSCOPY (EGD) WITH PROPOFOL;  Surgeon: Mauri Pole, MD;  Location: [place] [facility];  Service: Endoscopy;  Laterality: N/A;  . IR 3D INDEPENDENT WKST  01/06/2020  . IR ANGIO INTRA EXTRACRAN SEL INTERNAL CAROTID BILAT MOD SED  01/06/2020  . IR ANGIO VERTEBRAL SEL VERTEBRAL UNI L MOD SED  01/06/2020  . IR ANGIOGRAM FOLLOW UP STUDY  01/06/2020  . IR ANGIOGRAM FOLLOW UP STUDY  01/06/2020  . IR ANGIOGRAM FOLLOW UP STUDY  01/06/2020  . IR ANGIOGRAM FOLLOW UP STUDY  01/06/2020  . IR ANGIOGRAM FOLLOW UP STUDY  01/06/2020  . IR ANGIOGRAM FOLLOW UP STUDY  01/06/2020  . IR ANGIOGRAM FOLLOW UP STUDY  01/06/2020  . IR ANGIOGRAM FOLLOW UP STUDY  01/06/2020  . IR ANGIOGRAM FOLLOW UP STUDY  01/06/2020  . IR ANGIOGRAM FOLLOW UP STUDY  01/06/2020  . IR NEURO EACH ADD'L AFTER BASIC UNI RIGHT (MS)   01/06/2020  . IR TRANSCATH/EMBOLIZ  01/06/2020  . RADIOLOGY WITH ANESTHESIA N/A 01/06/2020   Procedure: IR WITH ANESTHESIA FOR ANEURYSM;  Surgeon: Consuella Lose, MD;  Location: [place];  Service: Radiology;  Laterality: N/A;  . TUBAL LIGATION      There were no vitals filed for this visit.   Subjective Assessment - 04/04/20 1603    Subjective  Pt reports right shoulder pain and hand pain    Pertinent History ICH 01/06/20, Aneurysm. PMH: OA bilateral knees and hands    Currently in Pain? Yes    Pain Score 3     Pain Location Hand   shoulder   Pain Orientation Right    Pain Descriptors / Indicators Aching    Pain Type Chronic pain    Pain Onset More than a month ago    Pain Frequency Constant    Aggravating Factors  moving it    Pain Relieving Factors rest               Treatment: Hotpack applied to right shoulder and hand x 10 mins while therapist initiated fabrication of custom splint. Pink coloration noted at right hand following use, however it dissipated following several mins. Pt performed self ROM reach for the floor shoulder flexion stretch, gentle passive ROM to digits in flexion/  extension then pt had therapist perform. Pt continues to be very fearful of movement due to pain. Splint was fitted and straps added. Will plan to issue next visit due to time constraints and need for pt. / caregiver education.                   OT Short Term Goals - 03/21/20 1430      OT SHORT TERM GOAL #1   Title I with initialHEP.    Time 6    Period Weeks    Status Achieved    Target Date 04/13/20      OT SHORT TERM GOAL #2   Title Pt will perform bathing with min A.    Time 6    Period Weeks    Status New      OT SHORT TERM GOAL #3   Title Pt will donn shirt and pants with min A    Time 6    Period Weeks    Status New      OT SHORT TERM GOAL #4   Title Pt will demonstrate 30 A/ROM shoulder flexion for RUE with pain less than or equal to 4/10.    Time 6     Period Weeks    Status New      OT SHORT TERM GOAL #5   Title Pt will  demonstrate improved LUE fine motor coordination for ADLs as evidenced by decreasing 9 hole peg test score to 45 secs or less.    Time 6    Period Weeks    Status New      OT SHORT TERM GOAL #6   Title Pt/ caregiver will verbalize understanding of RUE positioning and edema control techniques to minimize pain and risk for injury    Time 6    Period Weeks    Status New      OT SHORT TERM GOAL #7   Title Pt will demonstrate at least 25% finger flexion/ extension in RUE in prep for functional use.    Time 6    Period Weeks    Status New             OT Long Term Goals - 03/02/20 1527      OT LONG TERM GOAL #1   Title Pt will perfrom  all basic ADLS with supervision    Time 12    Period Weeks    Status New      OT LONG TERM GOAL #2   Title Pt will demonstrate 45* RUE shoulder flexion in prep for functional reach with pain less than or equal to 3/10.    Time 12    Period Weeks    Status New      OT LONG TERM GOAL #3   Title Pt will use RUE as a stabilizer/ gross A at least 30 % of the time for ADLs/ IADLs.    Time 12    Period Weeks    Status New      OT LONG TERM GOAL #4   Title Pt will increase LUE grip strength to 25 lbs or greater for increased ease with ADLs.    Time 12    Period Weeks    Status New      OT LONG TERM GOAL #5   Title Pt will perform simple home management/ snack prep with min A.    Time 12    Period Weeks    Status New  Long Term Additional Goals   Additional Long Term Goals Yes      OT LONG TERM GOAL #6   Title Pt will demonstrate ability to grasp/ release a cup 2/3 trials with RUE.    Time 12    Period Weeks    Status New      OT LONG TERM GOAL #7   Title Pt will safely navigate a busy environment and locate items with supervision and  90% or greater accuracy without bumping into items.    Time 12    Period Weeks    Status New      OT LONG TERM GOAL #8    Title I with updated HEP.    Time 12    Period Weeks    Status New                 Plan - 04/04/20 1555    Clinical Impression Statement Pt is progressing slowly limited by pain. Therapist fabricated resting hand splint, yet did not issue due to time constraints. Pt and family will benefit from practice of donning/ doffing splint.    Body Structure / Function / Physical Skills ADL;Balance;Coordination;Decreased knowledge of precautions;Decreased knowledge of use of DME;Dexterity;Edema;Mobility;Tone;Strength;IADL;Sensation;GMC;Gait;ROM;FMC;Flexibility;Pain;Vision;UE functional use;Endurance    Cognitive Skills Attention;Perception;Problem Solve;Safety Awareness;Thought    Rehab Potential Good    Clinical Decision Making Several treatment options, min-mod task modification necessary    OT Frequency 2x / week    OT Duration 12 weeks    OT Treatment/Interventions Self-care/ADL training;Ultrasound;Energy conservation;Visual/perceptual remediation/compensation;Patient/family education;DME and/or AE instruction;Aquatic Therapy;Paraffin;Gait Training;Passive range of motion;Balance training;Fluidtherapy;Electrical Stimulation;Functional Mobility Training;Splinting;Moist Heat;Therapeutic exercise;Manual Therapy;Cognitive remediation/compensation;Manual lymph drainage;Neuromuscular education;Coping strategies training    Plan hotpack to L hand and shoulder(pt with impaired sesation in hand use extra layer prn/ monitor), gentle ROM, education regarding splint/ wear/ care and precuations with practice donning/ doffing    Consulted and Agree with Plan of Care Patient;Family member/caregiver    Family Member Consulted spouse present end of session           Patient will benefit from skilled therapeutic intervention in order to improve the following deficits and impairments:   Body Structure / Function / Physical Skills: ADL, Balance, Coordination, Decreased knowledge of precautions, Decreased  knowledge of use of DME, Dexterity, Edema, Mobility, Tone, Strength, IADL, Sensation, GMC, Gait, ROM, FMC, Flexibility, Pain, Vision, UE functional use, Endurance Cognitive Skills: Attention, Perception, Problem Solve, Safety Awareness, Thought     Visit Diagnosis: Muscle weakness (generalized)  Acute pain of right shoulder  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 GIB (gastrointestinal bleeding) 02/28/2020  . Elevated BUN   . Prediabetes   . 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[place]) 01/20/2020  . Cerebral vasospasm   . Sinus tachycardia   . Dysphagia, post-stroke   . Thrombocytopenia ([place])   . Acute blood loss anemia   . Brain aneurysm   . ICH (intracerebral hemorrhage) ([place]) 01/05/2020    Lillyanne Bradburn 04/04/2020, 4:04 PM  [facility] [address] [place], [place], [address] Phone: [phone]   Fax:  [phone]  Name: SHIRLEEN MCFAUL MRN: [id number] Date of Birth: 1955-12-01

## 2020-04-05 ENCOUNTER — Ambulatory Visit (INDEPENDENT_AMBULATORY_CARE_PROVIDER_SITE_OTHER): Payer: BC Managed Care – PPO | Admitting: Podiatry

## 2020-04-05 DIAGNOSIS — N941 Unspecified dyspareunia: Secondary | ICD-10-CM | POA: Insufficient documentation

## 2020-04-05 DIAGNOSIS — M79674 Pain in right toe(s): Secondary | ICD-10-CM | POA: Diagnosis not present

## 2020-04-05 DIAGNOSIS — B351 Tinea unguium: Secondary | ICD-10-CM | POA: Diagnosis not present

## 2020-04-05 DIAGNOSIS — M79675 Pain in left toe(s): Secondary | ICD-10-CM | POA: Diagnosis not present

## 2020-04-05 NOTE — Progress Notes (Signed)
   SUBJECTIVE Patient presents to office today complaining of elongated, thickened nails that cause pain while ambulating in shoes.  She is unable to trim her own nails.  Patient also mostly complains of ingrown toenails to the bilateral first and second digits.  She states that she routinely goes to a pedicure salon and has them debrided and trimmed.  She has been doing this for years to help alleviate the pain from the ingrown toenails.  Most recently she has been treated for an aneurysm and multiple mini strokes.  She is currently on anticoagulant therapy.  She states that at the end of this month she will no longer be on the anticoagulant therapy.  Patient is here for further evaluation and treatment.  Past Medical History:  Diagnosis Date  . Melena     OBJECTIVE General Patient is awake, alert, and oriented x 3 and in no acute distress. Derm Skin is dry and supple bilateral. Negative open lesions or macerations. Remaining integument unremarkable. Nails are tender, long, thickened and dystrophic with subungual debris, consistent with onychomycosis, 1-5 bilateral. No signs of infection noted.  Incurvated nails noted to the medial lateral border of the bilateral first and second digits consistent with an ingrown toenail Vasc  DP and PT pedal pulses palpable bilaterally. Temperature gradient within normal limits.  Neuro Epicritic and protective threshold sensation grossly intact bilaterally.  Musculoskeletal Exam No symptomatic pedal deformities noted bilateral. Muscular strength within normal limits.  ASSESSMENT 1.  Pain due to onychomycosis of toenails both 2.  Ingrown toenails first and second digits bilateral 3.  Currently on anticoagulant therapy  PLAN OF CARE 1. Patient evaluated today.  2. Instructed to maintain good pedal hygiene and foot care.  3. Mechanical debridement of nails 1-5 bilaterally performed using a nail nipper. Filed with dremel without incident.  4.  Patient states  that she will discontinue her anticoagulant therapy at the end of this month.  Recommend returning in 1 month to address the ingrown toenails for nail matricectomy.  Patient would like them treated however it is in her best interest to wait until she is no longer on anticoagulant medication 5.  Return to clinic in 1 month   Edrick Kins, DPM [facility]  Dr. Edrick Kins, [place] [address] [place]                                        [place], [place] [address]                Office [phone]  Fax [phone]

## 2020-04-06 ENCOUNTER — Encounter: Payer: Self-pay | Admitting: Occupational Therapy

## 2020-04-06 ENCOUNTER — Ambulatory Visit: Payer: BC Managed Care – PPO

## 2020-04-06 ENCOUNTER — Ambulatory Visit: Payer: BC Managed Care – PPO | Admitting: Occupational Therapy

## 2020-04-06 ENCOUNTER — Other Ambulatory Visit: Payer: Self-pay

## 2020-04-06 DIAGNOSIS — R2689 Other abnormalities of gait and mobility: Secondary | ICD-10-CM | POA: Diagnosis not present

## 2020-04-06 DIAGNOSIS — I69159 Hemiplegia and hemiparesis following nontraumatic intracerebral hemorrhage affecting unspecified side: Secondary | ICD-10-CM

## 2020-04-06 DIAGNOSIS — R278 Other lack of coordination: Secondary | ICD-10-CM

## 2020-04-06 DIAGNOSIS — M79641 Pain in right hand: Secondary | ICD-10-CM

## 2020-04-06 DIAGNOSIS — M6281 Muscle weakness (generalized): Secondary | ICD-10-CM

## 2020-04-06 DIAGNOSIS — R41844 Frontal lobe and executive function deficit: Secondary | ICD-10-CM

## 2020-04-06 DIAGNOSIS — M25511 Pain in right shoulder: Secondary | ICD-10-CM

## 2020-04-06 NOTE — Therapy (Signed)
[facility] [address] [place], [place], [address] Phone: [phone]   Fax:  [phone]  Occupational Therapy Treatment  Patient Details  Name: Erika Stanton MRN: [id number] Date of Birth: 20-Aug-1955 Referring Provider (OT): Dr. Naaman Plummer   Encounter Date: 04/06/2020   OT End of Session - 04/06/20 1538    Visit Number 10    Number of Visits 25    Date for OT Re-Evaluation 05/25/20    Authorization Time Period State BCBS    OT Start Time 1532    OT Stop Time 1615    OT Time Calculation (min) 43 min    Activity Tolerance Patient tolerated treatment well    Behavior During Therapy [facility] for tasks assessed/performed           Past Medical History:  Diagnosis Date  . Melena     Past Surgical History:  Procedure Laterality Date  . CHOLECYSTECTOMY    . COLONOSCOPY  11/2017   at [facility]. no recurrent polyps.  suggest repeat surveillance study 11/2022.    Marland Kitchen COLONOSCOPY W/ POLYPECTOMY  06/2014   Dr Roxy Manns at [facility].  3 adenomatoous polyps, anal fissure.    . ESOPHAGOGASTRODUODENOSCOPY (EGD) WITH PROPOFOL N/A 01/27/2020   Procedure: ESOPHAGOGASTRODUODENOSCOPY (EGD) WITH PROPOFOL;  Surgeon: Mauri Pole, MD;  Location: [facility];  Service: Endoscopy;  Laterality: N/A;  . IR 3D INDEPENDENT WKST  01/06/2020  . IR ANGIO INTRA EXTRACRAN SEL INTERNAL CAROTID BILAT MOD SED  01/06/2020  . IR ANGIO VERTEBRAL SEL VERTEBRAL UNI L MOD SED  01/06/2020  . IR ANGIOGRAM FOLLOW UP STUDY  01/06/2020  . IR ANGIOGRAM FOLLOW UP STUDY  01/06/2020  . IR ANGIOGRAM FOLLOW UP STUDY  01/06/2020  . IR ANGIOGRAM FOLLOW UP STUDY  01/06/2020  . IR ANGIOGRAM FOLLOW UP STUDY  01/06/2020  . IR ANGIOGRAM FOLLOW UP STUDY  01/06/2020  . IR ANGIOGRAM FOLLOW UP STUDY  01/06/2020  . IR ANGIOGRAM FOLLOW UP STUDY  01/06/2020  . IR ANGIOGRAM FOLLOW UP STUDY  01/06/2020  . IR ANGIOGRAM FOLLOW UP STUDY  01/06/2020  . IR NEURO EACH ADD'L AFTER BASIC UNI RIGHT (MS)   01/06/2020  . IR TRANSCATH/EMBOLIZ  01/06/2020  . RADIOLOGY WITH ANESTHESIA N/A 01/06/2020   Procedure: IR WITH ANESTHESIA FOR ANEURYSM;  Surgeon: Consuella Lose, MD;  Location: [facility];  Service: Radiology;  Laterality: N/A;  . TUBAL LIGATION      There were no vitals filed for this visit.   Subjective Assessment - 04/06/20 1538    Subjective  Pt reports right shoulder pain and hand pain    Pertinent History ICH 01/06/20, Aneurysm. PMH: OA bilateral knees and hands    Currently in Pain? Yes    Pain Score 4     Pain Location Arm    Pain Orientation Right    Pain Descriptors / Indicators Aching    Pain Type Chronic pain    Pain Onset More than a month ago    Pain Frequency Constant    Aggravating Factors  moving it    Pain Relieving Factors restin it            Treatment:  Pt's spouse says pt is getting more self-sufficient and doing more around the house  Education provided while applying moist heat to RUE hand and shoulder.   Pt and spouse demonstrated compliance with donning and doffing splint.  Table slides for flexion/reach, horizontal abduction and circumduction in both directions with minimal  pain reported. Pt receptive to heat for pain relief and for gentle mobilizations on table.  AROM noted in RUE for supination/pronation for gravity eliminated position.                     OT Short Term Goals - 03/21/20 1430      OT SHORT TERM GOAL #1   Title I with initialHEP.    Time 6    Period Weeks    Status Achieved    Target Date 04/13/20      OT SHORT TERM GOAL #2   Title Pt will perform bathing with min A.    Time 6    Period Weeks    Status New      OT SHORT TERM GOAL #3   Title Pt will donn shirt and pants with min A    Time 6    Period Weeks    Status New      OT SHORT TERM GOAL #4   Title Pt will demonstrate 30 A/ROM shoulder flexion for RUE with pain less than or equal to 4/10.    Time 6    Period Weeks    Status New      OT  SHORT TERM GOAL #5   Title Pt will  demonstrate improved LUE fine motor coordination for ADLs as evidenced by decreasing 9 hole peg test score to 45 secs or less.    Time 6    Period Weeks    Status New      OT SHORT TERM GOAL #6   Title Pt/ caregiver will verbalize understanding of RUE positioning and edema control techniques to minimize pain and risk for injury    Time 6    Period Weeks    Status New      OT SHORT TERM GOAL #7   Title Pt will demonstrate at least 25% finger flexion/ extension in RUE in prep for functional use.    Time 6    Period Weeks    Status New             OT Long Term Goals - 03/02/20 1527      OT LONG TERM GOAL #1   Title Pt will perfrom  all basic ADLS with supervision    Time 12    Period Weeks    Status New      OT LONG TERM GOAL #2   Title Pt will demonstrate 45* RUE shoulder flexion in prep for functional reach with pain less than or equal to 3/10.    Time 12    Period Weeks    Status New      OT LONG TERM GOAL #3   Title Pt will use RUE as a stabilizer/ gross A at least 30 % of the time for ADLs/ IADLs.    Time 12    Period Weeks    Status New      OT LONG TERM GOAL #4   Title Pt will increase LUE grip strength to 25 lbs or greater for increased ease with ADLs.    Time 12    Period Weeks    Status New      OT LONG TERM GOAL #5   Title Pt will perform simple home management/ snack prep with min A.    Time 12    Period Weeks    Status New      Long Term Additional Goals   Additional Long Term  Goals Yes      OT LONG TERM GOAL #6   Title Pt will demonstrate ability to grasp/ release a cup 2/3 trials with RUE.    Time 12    Period Weeks    Status New      OT LONG TERM GOAL #7   Title Pt will safely navigate a busy environment and locate items with supervision and  90% or greater accuracy without bumping into items.    Time 12    Period Weeks    Status New      OT LONG TERM GOAL #8   Title I with updated HEP.    Time 12     Period Weeks    Status New                 Plan - 04/06/20 1620    Clinical Impression Statement Pt is continuing to progress however continues to be limited by pain and pain tolerance for RUE. Pt does well with self-passive movement of RUE and taking things slower for increasing range of motion and mobility in RUE.    Body Structure / Function / Physical Skills ADL;Balance;Coordination;Decreased knowledge of precautions;Decreased knowledge of use of DME;Dexterity;Edema;Mobility;Tone;Strength;IADL;Sensation;GMC;Gait;ROM;FMC;Flexibility;Pain;Vision;UE functional use;Endurance    Cognitive Skills Attention;Perception;Problem Solve;Safety Awareness;Thought    Rehab Potential Good    Clinical Decision Making Several treatment options, min-mod task modification necessary    OT Frequency 2x / week    OT Duration 12 weeks    OT Treatment/Interventions Self-care/ADL training;Ultrasound;Energy conservation;Visual/perceptual remediation/compensation;Patient/family education;DME and/or AE instruction;Aquatic Therapy;Paraffin;Gait Training;Passive range of motion;Balance training;Fluidtherapy;Electrical Stimulation;Functional Mobility Training;Splinting;Moist Heat;Therapeutic exercise;Manual Therapy;Cognitive remediation/compensation;Manual lymph drainage;Neuromuscular education;Coping strategies training    Plan continue working on increasing pain tolerance and moving RUE. Review splint and see how it is being tolerated at home.    Consulted and Agree with Plan of Care Patient;Family member/caregiver    Family Member Consulted spouse present end of session           Patient will benefit from skilled therapeutic intervention in order to improve the following deficits and impairments:   Body Structure / Function / Physical Skills: ADL, Balance, Coordination, Decreased knowledge of precautions, Decreased knowledge of use of DME, Dexterity, Edema, Mobility, Tone, Strength, IADL, Sensation, GMC,  Gait, ROM, FMC, Flexibility, Pain, Vision, UE functional use, Endurance Cognitive Skills: Attention, Perception, Problem Solve, Safety Awareness, Thought     Visit Diagnosis: Muscle weakness (generalized)  Acute pain of right shoulder  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 Dyspareunia in female 04/05/2020  . History of abnormal cervical Pap smear 03/10/2020  . GIB (gastrointestinal bleeding) 02/28/2020  . Elevated BUN   . Prediabetes   . 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[facility]) 01/20/2020  . Cerebral vasospasm   . Sinus tachycardia   . Dysphagia, post-stroke   . Thrombocytopenia ([facility])   . Acute blood loss anemia   . Brain aneurysm   . ICH (intracerebral hemorrhage) ([facility]) 01/05/2020  . History of adenomatous polyp of colon 02/07/2016  . 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 Fissure in ano 11/11/2014  . Hemorrhoids 11/11/2014  . Constipation 11/19/2013  . GERD (gastroesophageal reflux disease) 03/24/2013  . Irritable bowel syndrome with diarrhea 03/24/2013  . Herpes zoster 05/14/2005  . Abnormal Pap smear of cervix 05/15/1983  .  History of cervical dysplasia 05/15/1983    Zachery Conch MOT, OTR/L  04/06/2020, 4:52 PM  [facility] [address] [place], [place], [address] Phone: [phone]   Fax:  [phone]  Name: Erika Stanton MRN: [id number] Date of Birth: 05/07/1956

## 2020-04-06 NOTE — Therapy (Signed)
[facility] [address] [place], [place], [address] Phone: [phone]   Fax:  [phone]  Physical Therapy Treatment  Patient Details  Name: Erika Stanton MRN: [id number] Date of Birth: 1956/01/28 Referring Provider (PT): Alger Simons, MD   Encounter Date: 04/06/2020   PT End of Session - 04/06/20 1617    Visit Number 16    Number of Visits 21    Date for PT Re-Evaluation 04/25/20    Authorization Type BCBS State    PT Start Time 1615    PT Stop Time 1656    PT Time Calculation (min) 41 min    Equipment Utilized During Treatment Gait belt    Activity Tolerance Patient tolerated treatment well    Behavior During Therapy [facility] for tasks assessed/performed           Past Medical History:  Diagnosis Date  . Melena     Past Surgical History:  Procedure Laterality Date  . CHOLECYSTECTOMY    . COLONOSCOPY  11/2017   at [facility]. no recurrent polyps.  suggest repeat surveillance study 11/2022.    Marland Kitchen COLONOSCOPY W/ POLYPECTOMY  06/2014   Dr Roxy Manns at [facility].  3 adenomatoous polyps, anal fissure.    . ESOPHAGOGASTRODUODENOSCOPY (EGD) WITH PROPOFOL N/A 01/27/2020   Procedure: ESOPHAGOGASTRODUODENOSCOPY (EGD) WITH PROPOFOL;  Surgeon: Mauri Pole, MD;  Location: [facility];  Service: Endoscopy;  Laterality: N/A;  . IR 3D INDEPENDENT WKST  01/06/2020  . IR ANGIO INTRA EXTRACRAN SEL INTERNAL CAROTID BILAT MOD SED  01/06/2020  . IR ANGIO VERTEBRAL SEL VERTEBRAL UNI L MOD SED  01/06/2020  . IR ANGIOGRAM FOLLOW UP STUDY  01/06/2020  . IR ANGIOGRAM FOLLOW UP STUDY  01/06/2020  . IR ANGIOGRAM FOLLOW UP STUDY  01/06/2020  . IR ANGIOGRAM FOLLOW UP STUDY  01/06/2020  . IR ANGIOGRAM FOLLOW UP STUDY  01/06/2020  . IR ANGIOGRAM FOLLOW UP STUDY  01/06/2020  . IR ANGIOGRAM FOLLOW UP STUDY  01/06/2020  . IR ANGIOGRAM FOLLOW UP STUDY  01/06/2020  . IR ANGIOGRAM FOLLOW UP STUDY  01/06/2020  . IR ANGIOGRAM FOLLOW UP STUDY  01/06/2020  .  IR NEURO EACH ADD'L AFTER BASIC UNI RIGHT (MS)  01/06/2020  . IR TRANSCATH/EMBOLIZ  01/06/2020  . RADIOLOGY WITH ANESTHESIA N/A 01/06/2020   Procedure: IR WITH ANESTHESIA FOR ANEURYSM;  Surgeon: Consuella Lose, MD;  Location: [facility];  Service: Radiology;  Laterality: N/A;  . TUBAL LIGATION      There were no vitals filed for this visit.   Subjective Assessment - 04/06/20 1617    Subjective Pt just finished OT. Walking going good at home.    Limitations Walking;Standing;House hold activities    Patient Stated Goals Use my right hand like I used to.    Currently in Pain? Yes    Pain Score 2     Pain Location Shoulder   and hand   Pain Orientation Right    Pain Descriptors / Indicators Aching    Pain Type Chronic pain    Pain Onset More than a month ago    Pain Frequency Constant    Aggravating Factors  moving it    Pain Onset More than a month ago                             [facility] Adult PT Treatment/Exercise - 04/06/20 1620      Ambulation/Gait   Ambulation/Gait  Yes    Ambulation/Gait Assistance 5: Supervision    Ambulation/Gait Assistance Details around in clinic with activities    Assistive device None    Gait Pattern Step-through pattern    Ambulation Surface Level;Indoor      Neuro Re-ed    Neuro Re-ed Details  Pt performed dynamic gait activities in hallway 40' x 2: marching with cues to take her time to increase SLS, tandem gait, backwards gait with verbal cues to increase left step, gait with head turns on command left/right/up/down. Close SBA/CGA with activities especially tandem. Step-ups on soft blue foam beam x 5 each leg getting balance each time then stepping up and tapping cone with other foot and back x 5 each side CGA. Pt was cued to tighten bottom to stay up tall and control movement better.  Pt performed reciprocal stepping over 3 cones tapping each cone with both legs prior to stepping x 4 bouts CGA. Sit to stand x 5 with soft blue foam beam  under feet with PT providing some support at right arm to try to get a little weight bearing through elbow with sit to/from stand. Pt instructed to get balance each time. Pt reported right shoulder painful with any movement from PT.      Lumbar Exercises: Aerobic   Other Aerobic Exercise Sci-Fit x 6 min level 4 legs only to increase activity tolerance.                     PT Short Term Goals - 03/23/20 1335      PT SHORT TERM GOAL #1   Title Pt/spouse will be independent with initial HEP in order to indicate decreased fall risk and improved functional mobility.  (Target Date: 03/26/20)    Baseline performing most of HEP 03/21/20    Time 4    Period Weeks    Status Achieved    Target Date 03/26/20      PT SHORT TERM GOAL #2   Title Pt will complete BERG balance test and improve score by 6 points from baseline in order to indicate decreased fall risk.    Baseline 46/56 on 03/01/20; 54/56 03/21/20    Time 4    Period Weeks    Status Achieved      PT SHORT TERM GOAL #3   Title Pt will improve 5TSS to </=25 secs with single UE support in order to indicate dec fall risk and improved functional strength.    Baseline 16 sec with one UE (03/21/20)    Time 4    Period Weeks    Status Achieved      PT SHORT TERM GOAL #4   Title Pt will improve gait speed to >/=2.4 ft/sec with LRAD in order to indicate dec fall risk.    Baseline 03/23/20 3.21 ft/sec    Time 4    Period Weeks    Status Achieved      PT SHORT TERM GOAL #5   Title Pt will ambulate x 17' at S level with LRAD in simulated household environment in order to indicate improved independence in her home.    Baseline 1050' without AD    Time 4    Period Weeks    Status Achieved             PT Long Term Goals - 03/23/20 1422      PT LONG TERM GOAL #1   Title Pt will be independent with progressive HEP for balance and  strengthening to continue gains on own.    Time 8    Period Weeks    Status Revised    Target  Date 04/25/20      PT LONG TERM GOAL #2   Title Pt will increase FGA from 19/30 to >24/30 for improved balance and gait to decrease fall risk.    Baseline 03/23/20 19/30    Time 8    Period Weeks    Status New    Target Date 04/25/20      PT LONG TERM GOAL #3   Title Pt will perform 5TSS in </=13 secs without UE support in order to indicate improved functional strength    Baseline 16 seconds with L UE support 03/21/20    Time 8    Period Weeks    Status Revised    Target Date 04/25/20      PT LONG TERM GOAL #4   Title Pt will ambulate >1000' on varied surfaces independently with no LOB for improved community ambulation without AD.    Baseline 1050' including >300 feet of grass surface with SBA 03/21/20; we worked on ramp and curb also.    Time 8    Period Weeks    Status Revised    Target Date 04/25/20                 Plan - 04/06/20 1703    Clinical Impression Statement Pt continues to show improvement with balance and gait activities. Better coordination with tapping cones today. PT added in NuStep to continue to try to increase activity tolerance. Still not tolerating any WB through right arm.    Personal Factors and Comorbidities Age;Comorbidity 1;Profession    Comorbidities B ICHs    Examination-Activity Limitations Bend;Carry;Lift;Locomotion Level;Reach Overhead;Squat;Stairs;Stand;Transfers    Examination-Participation Restrictions Cleaning;Community Activity;Driving;Occupation;Yard Work;Laundry;Meal Prep    Stability/Clinical Decision Making Evolving/Moderate complexity    Rehab Potential Good    PT Frequency 3x / week   then 2x/wk for 4 weeks   PT Duration 4 weeks   then 2x/wk for 4 weeks   PT Treatment/Interventions ADLs/Self Care Home Management;Aquatic Therapy;Electrical Stimulation;DME Instruction;Gait training;Stair training;Functional mobility training;Therapeutic activities;Therapeutic exercise;Balance training;Neuromuscular re-education;Cognitive  remediation;Patient/family education;Orthotic Fit/Training;Manual techniques;Passive range of motion;Dry needling;Energy conservation;Vestibular;Visual/perceptual remediation/compensation    PT Next Visit Plan Continue with dynamic gait and balance activities. Activities to try to incorporate more core and shoulder activation as able, LLE NMR.    Consulted and Agree with Plan of Care Patient;Family member/caregiver           Patient will benefit from skilled therapeutic intervention in order to improve the following deficits and impairments:  Abnormal gait, Decreased activity tolerance, Decreased balance, Decreased cognition, Decreased endurance, Decreased knowledge of use of DME, Decreased mobility, Decreased strength, Impaired perceived functional ability, Impaired flexibility, Impaired sensation, Postural dysfunction, Improper body mechanics, Impaired vision/preception, Impaired UE functional use, Impaired tone  Visit Diagnosis: Other abnormalities of gait and mobility  Muscle weakness (generalized)     Problem List Patient Active Problem List   Diagnosis Date Noted  . Dyspareunia in female 04/05/2020  . History of abnormal cervical Pap smear 03/10/2020  . GIB (gastrointestinal bleeding) 02/28/2020  . Elevated BUN   . Prediabetes   . 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[facility]) 01/20/2020  . Cerebral vasospasm   . Sinus tachycardia   . Dysphagia, post-stroke   . Thrombocytopenia ([facility])   . Acute blood loss anemia   . Brain aneurysm   . ICH (intracerebral hemorrhage) ([facility]) 01/05/2020  .  History of adenomatous polyp of colon 02/07/2016  . 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 Fissure in ano 11/11/2014  . Hemorrhoids 11/11/2014  . Constipation 11/19/2013  . GERD (gastroesophageal reflux disease) 03/24/2013  . Irritable bowel syndrome with diarrhea 03/24/2013  . Herpes zoster 05/14/2005  . Abnormal Pap smear of cervix 05/15/1983  . History of cervical dysplasia 05/15/1983     Electa Sniff, PT, DPT, NCS 04/06/2020, 5:07 PM  [facility] [address] [place], [place], [address] Phone: [phone]   Fax:  [phone]  Name: Erika Stanton MRN: [id number] Date of Birth: 1955/10/10

## 2020-04-11 ENCOUNTER — Ambulatory Visit: Payer: BC Managed Care – PPO | Admitting: Occupational Therapy

## 2020-04-11 ENCOUNTER — Encounter: Payer: Self-pay | Admitting: Speech Pathology

## 2020-04-11 ENCOUNTER — Encounter: Payer: Self-pay | Admitting: Rehabilitation

## 2020-04-11 ENCOUNTER — Ambulatory Visit: Payer: BC Managed Care – PPO | Admitting: Speech Pathology

## 2020-04-11 ENCOUNTER — Telehealth: Payer: Self-pay

## 2020-04-11 ENCOUNTER — Ambulatory Visit: Payer: BC Managed Care – PPO | Admitting: Rehabilitation

## 2020-04-11 ENCOUNTER — Other Ambulatory Visit: Payer: Self-pay

## 2020-04-11 DIAGNOSIS — R41841 Cognitive communication deficit: Secondary | ICD-10-CM

## 2020-04-11 DIAGNOSIS — M25511 Pain in right shoulder: Secondary | ICD-10-CM

## 2020-04-11 DIAGNOSIS — M79641 Pain in right hand: Secondary | ICD-10-CM

## 2020-04-11 DIAGNOSIS — R2689 Other abnormalities of gait and mobility: Secondary | ICD-10-CM | POA: Diagnosis not present

## 2020-04-11 DIAGNOSIS — I69159 Hemiplegia and hemiparesis following nontraumatic intracerebral hemorrhage affecting unspecified side: Secondary | ICD-10-CM

## 2020-04-11 DIAGNOSIS — R2681 Unsteadiness on feet: Secondary | ICD-10-CM

## 2020-04-11 DIAGNOSIS — M6281 Muscle weakness (generalized): Secondary | ICD-10-CM

## 2020-04-11 NOTE — Therapy (Signed)
[facility] [address] [place], [place], [address] Phone: [phone]   Fax:  [phone]  Physical Therapy Treatment  Patient Details  Name: Erika Stanton MRN: [id number] Date of Birth: 03-09-1956 Referring Provider (PT): Alger Simons, MD   Encounter Date: 04/11/2020   PT End of Session - 04/11/20 1246    Visit Number 17    Number of Visits 21    Date for PT Re-Evaluation 04/25/20    Authorization Type [employer] State    PT Start Time 1150   pt went to restroom   PT Stop Time 1230    PT Time Calculation (min) 40 min    Equipment Utilized During Treatment Gait belt    Activity Tolerance Patient tolerated treatment well    Behavior During Therapy [facility] for tasks assessed/performed           Past Medical History:  Diagnosis Date  . Melena     Past Surgical History:  Procedure Laterality Date  . CHOLECYSTECTOMY    . COLONOSCOPY  11/2017   at [facility]. no recurrent polyps.  suggest repeat surveillance study 11/2022.    Marland Kitchen COLONOSCOPY W/ POLYPECTOMY  06/2014   Dr Roxy Manns at [facility].  3 adenomatoous polyps, anal fissure.    . ESOPHAGOGASTRODUODENOSCOPY (EGD) WITH PROPOFOL N/A 01/27/2020   Procedure: ESOPHAGOGASTRODUODENOSCOPY (EGD) WITH PROPOFOL;  Surgeon: Mauri Pole, MD;  Location: [facility];  Service: Endoscopy;  Laterality: N/A;  . IR 3D INDEPENDENT WKST  01/06/2020  . IR ANGIO INTRA EXTRACRAN SEL INTERNAL CAROTID BILAT MOD SED  01/06/2020  . IR ANGIO VERTEBRAL SEL VERTEBRAL UNI L MOD SED  01/06/2020  . IR ANGIOGRAM FOLLOW UP STUDY  01/06/2020  . IR ANGIOGRAM FOLLOW UP STUDY  01/06/2020  . IR ANGIOGRAM FOLLOW UP STUDY  01/06/2020  . IR ANGIOGRAM FOLLOW UP STUDY  01/06/2020  . IR ANGIOGRAM FOLLOW UP STUDY  01/06/2020  . IR ANGIOGRAM FOLLOW UP STUDY  01/06/2020  . IR ANGIOGRAM FOLLOW UP STUDY  01/06/2020  . IR ANGIOGRAM FOLLOW UP STUDY  01/06/2020  . IR ANGIOGRAM FOLLOW UP STUDY  01/06/2020  . IR ANGIOGRAM FOLLOW UP  STUDY  01/06/2020  . IR NEURO EACH ADD'L AFTER BASIC UNI RIGHT (MS)  01/06/2020  . IR TRANSCATH/EMBOLIZ  01/06/2020  . RADIOLOGY WITH ANESTHESIA N/A 01/06/2020   Procedure: IR WITH ANESTHESIA FOR ANEURYSM;  Surgeon: Consuella Lose, MD;  Location: [facility];  Service: Radiology;  Laterality: N/A;  . TUBAL LIGATION      There were no vitals filed for this visit.   Subjective Assessment - 04/11/20 1154    Subjective Pt does report some shoulder pain today.    Patient is accompained by: Family member    Limitations Walking;Standing;House hold activities    How long can you walk comfortably? Did a little walking yesterday up/down the hallway    Patient Stated Goals Use my right hand like I used to.    Currently in Pain? Yes    Pain Score 2     Pain Location Shoulder    Pain Orientation Right    Pain Descriptors / Indicators Aching    Pain Type Chronic pain    Pain Radiating Towards more shoulder than hand    Pain Onset More than a month ago    Pain Frequency Constant    Aggravating Factors  moving it    Pain Relieving Factors resting it  [place] Adult PT Treatment/Exercise - 04/11/20 1159      Ambulation/Gait   Ambulation/Gait Yes    Ambulation/Gait Assistance 5: Supervision    Ambulation/Gait Assistance Details Throughout session with S for safety.      Ambulation Distance (Feet) 200 Feet    Assistive device None    Gait Pattern Step-through pattern    Ambulation Surface Level;Indoor      High Level Balance   High Level Balance Comments Forward progression along stepping stones alternating foot placement x 4 laps followed by forward stepping over taller orange barriers x 4 laps for LE strengthening and high level balance with standing in SLS for periods of time during stepping.  Cues for slower movement and improved hip flex for improved clearance of barriers.  Added remaining orange barriers and performed lateral stepping x 4 laps with  cues for single step only (avoiding taking small steps) at min/guard level.  At counter top on small rocker board biased vertically performing ball taps back to student observer x 20 reps with intermittent support as needed from LUE on counter and intermittent min/guard to correct balance.  Then biased board horizontally with same task x 20 reps.  Slightly more difficult in this position.  In // bars on BOSU (black top up-Min A to get on BOSU) with feet apart EO maintaining balance x 3 reps of 20 secs.  Pt with marked posterior bias therefore  had her work from posterior to anterior weight shift x 10 reps, progressing to clockwise movements x 10 reps all with intermittent UE support and max encouragement (some assist from PT) for forward weight shift.  Ended on BOSU (blue top up) with LLE in middle moving RLE into/out of abduction x 10 reps with min A and continued facilitation/cues for LLE activation during moving of RLE.        Lumbar Exercises: Aerobic   Nustep LEs only x 6 mins at level 4 resistance.  Tolerated well.                     PT Short Term Goals - 03/23/20 1335      PT SHORT TERM GOAL #1   Title Pt/spouse will be independent with initial HEP in order to indicate decreased fall risk and improved functional mobility.  (Target Date: 03/26/20)    Baseline performing most of HEP 03/21/20    Time 4    Period Weeks    Status Achieved    Target Date 03/26/20      PT SHORT TERM GOAL #2   Title Pt will complete BERG balance test and improve score by 6 points from baseline in order to indicate decreased fall risk.    Baseline 46/56 on 03/01/20; 54/56 03/21/20    Time 4    Period Weeks    Status Achieved      PT SHORT TERM GOAL #3   Title Pt will improve 5TSS to </=25 secs with single UE support in order to indicate dec fall risk and improved functional strength.    Baseline 16 sec with one UE (03/21/20)    Time 4    Period Weeks    Status Achieved      PT SHORT TERM GOAL #4    Title Pt will improve gait speed to >/=2.4 ft/sec with LRAD in order to indicate dec fall risk.    Baseline 03/23/20 3.21 ft/sec    Time 4    Period Weeks    Status Achieved  PT SHORT TERM GOAL #5   Title Pt will ambulate x 44' at S level with LRAD in simulated household environment in order to indicate improved independence in her home.    Baseline 1050' without AD    Time 4    Period Weeks    Status Achieved             PT Long Term Goals - 03/23/20 1422      PT LONG TERM GOAL #1   Title Pt will be independent with progressive HEP for balance and strengthening to continue gains on own.    Time 8    Period Weeks    Status Revised    Target Date 04/25/20      PT LONG TERM GOAL #2   Title Pt will increase FGA from 19/30 to >24/30 for improved balance and gait to decrease fall risk.    Baseline 03/23/20 19/30    Time 8    Period Weeks    Status New    Target Date 04/25/20      PT LONG TERM GOAL #3   Title Pt will perform 5TSS in </=13 secs without UE support in order to indicate improved functional strength    Baseline 16 seconds with L UE support 03/21/20    Time 8    Period Weeks    Status Revised    Target Date 04/25/20      PT LONG TERM GOAL #4   Title Pt will ambulate >1000' on varied surfaces independently with no LOB for improved community ambulation without AD.    Baseline 1050' including >300 feet of grass surface with SBA 03/21/20; we worked on ramp and curb also.    Time 8    Period Weeks    Status Revised    Target Date 04/25/20                 Plan - 04/11/20 1247    Clinical Impression Statement Skilled session continues to focus on high level balance and LLE NMR.  Pt does demonstrate improved ability to maintain LLE activation longer than previous sessions and demos overall improved balance.    Personal Factors and Comorbidities Age;Comorbidity 1;Profession    Comorbidities B ICHs    Examination-Activity Limitations  Bend;Carry;Lift;Locomotion Level;Reach Overhead;Squat;Stairs;Stand;Transfers    Examination-Participation Restrictions Cleaning;Community Activity;Driving;Occupation;Yard Work;Laundry;Meal Prep    Stability/Clinical Decision Making Evolving/Moderate complexity    Rehab Potential Good    PT Frequency 3x / week   then 2x/wk for 4 weeks   PT Duration 4 weeks   then 2x/wk for 4 weeks   PT Treatment/Interventions ADLs/Self Care Home Management;Aquatic Therapy;Electrical Stimulation;DME Instruction;Gait training;Stair training;Functional mobility training;Therapeutic activities;Therapeutic exercise;Balance training;Neuromuscular re-education;Cognitive remediation;Patient/family education;Orthotic Fit/Training;Manual techniques;Passive range of motion;Dry needling;Energy conservation;Vestibular;Visual/perceptual remediation/compensation    PT Next Visit Plan I believe she starts at [facility] on the 13th so we may wanna do a recert before then?? Continue with dynamic gait and balance activities. Activities to try to incorporate more core and shoulder activation as able, LLE NMR.    Consulted and Agree with Plan of Care Patient;Family member/caregiver           Patient will benefit from skilled therapeutic intervention in order to improve the following deficits and impairments:  Abnormal gait, Decreased activity tolerance, Decreased balance, Decreased cognition, Decreased endurance, Decreased knowledge of use of DME, Decreased mobility, Decreased strength, Impaired perceived functional ability, Impaired flexibility, Impaired sensation, Postural dysfunction, Improper body mechanics, Impaired vision/preception, Impaired UE functional use, Impaired  tone  Visit Diagnosis: Muscle weakness (generalized)  Other abnormalities of gait and mobility  Unsteadiness on feet  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 Dyspareunia in female 04/05/2020  . History of abnormal cervical Pap smear 03/10/2020  . GIB (gastrointestinal bleeding) 02/28/2020  . Elevated BUN   . Prediabetes   . 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 Cerebral vasospasm   . Sinus tachycardia   . Dysphagia, post-stroke   . Thrombocytopenia ([place])   . Acute blood loss anemia   . Brain aneurysm   . ICH (intracerebral hemorrhage) ([place]) 01/05/2020  . History of adenomatous polyp of colon 02/07/2016  . 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 Fissure in ano 11/11/2014  . Hemorrhoids 11/11/2014  . Constipation 11/19/2013  . GERD (gastroesophageal reflux disease) 03/24/2013  . Irritable bowel syndrome with diarrhea 03/24/2013  . Herpes zoster 05/14/2005  . Abnormal Pap smear of cervix 05/15/1983  . History of cervical dysplasia 05/15/1983    Cameron Sprang, PT, MPT [facility] [address] [place], [place], [address] Phone: [phone]   Fax:  [phone] 04/11/20, 12:51 PM  Name: Erika Stanton MRN: [id number] Date of Birth: 1955-09-16

## 2020-04-11 NOTE — Therapy (Signed)
[facility] [address] [place], [place], [address] Phone: [phone]   Fax:  [phone]  Speech Language Pathology Treatment  Patient Details  Name: Erika Stanton MRN: [id number] Date of Birth: 1955-10-05 Referring Provider (SLP): Alger Simons, MD   Encounter Date: 04/11/2020   End of Session - 04/11/20 1145    Visit Number 11    Number of Visits 17    Date for SLP Re-Evaluation 05/27/20    SLP Start Time 1103    SLP Stop Time  1146    SLP Time Calculation (min) 43 min    Activity Tolerance Patient tolerated treatment well           Past Medical History:  Diagnosis Date  . Melena     Past Surgical History:  Procedure Laterality Date  . CHOLECYSTECTOMY    . COLONOSCOPY  11/2017   at [facility]. no recurrent polyps.  suggest repeat surveillance study 11/2022.    Marland Kitchen COLONOSCOPY W/ POLYPECTOMY  06/2014   Dr Roxy Manns at [facility].  3 adenomatoous polyps, anal fissure.    . ESOPHAGOGASTRODUODENOSCOPY (EGD) WITH PROPOFOL N/A 01/27/2020   Procedure: ESOPHAGOGASTRODUODENOSCOPY (EGD) WITH PROPOFOL;  Surgeon: Mauri Pole, MD;  Location: [facility];  Service: Endoscopy;  Laterality: N/A;  . IR 3D INDEPENDENT WKST  01/06/2020  . IR ANGIO INTRA EXTRACRAN SEL INTERNAL CAROTID BILAT MOD SED  01/06/2020  . IR ANGIO VERTEBRAL SEL VERTEBRAL UNI L MOD SED  01/06/2020  . IR ANGIOGRAM FOLLOW UP STUDY  01/06/2020  . IR ANGIOGRAM FOLLOW UP STUDY  01/06/2020  . IR ANGIOGRAM FOLLOW UP STUDY  01/06/2020  . IR ANGIOGRAM FOLLOW UP STUDY  01/06/2020  . IR ANGIOGRAM FOLLOW UP STUDY  01/06/2020  . IR ANGIOGRAM FOLLOW UP STUDY  01/06/2020  . IR ANGIOGRAM FOLLOW UP STUDY  01/06/2020  . IR ANGIOGRAM FOLLOW UP STUDY  01/06/2020  . IR ANGIOGRAM FOLLOW UP STUDY  01/06/2020  . IR ANGIOGRAM FOLLOW UP STUDY  01/06/2020  . IR NEURO EACH ADD'L AFTER BASIC UNI RIGHT (MS)  01/06/2020  . IR TRANSCATH/EMBOLIZ  01/06/2020  . RADIOLOGY WITH ANESTHESIA N/A  01/06/2020   Procedure: IR WITH ANESTHESIA FOR ANEURYSM;  Surgeon: Consuella Lose, MD;  Location: [facility];  Service: Radiology;  Laterality: N/A;  . TUBAL LIGATION      There were no vitals filed for this visit.   Subjective Assessment - 04/11/20 1106    Subjective "It's this right hand - everyone says it takes time"    Currently in Pain? Yes    Pain Score 2     Pain Location Shoulder    Pain Orientation Right    Pain Descriptors / Indicators Aching    Pain Type Chronic pain    Pain Onset More than a month ago    Pain Frequency Constant                 ADULT SLP TREATMENT - 04/11/20 1108      General Information   Behavior/Cognition Alert;Cooperative;Impulsive;Pleasant mood      Treatment Provided   Treatment provided Cognitive-Linquistic      Cognitive-Linquistic Treatment   Treatment focused on Cognition;Patient/family/caregiver education    Skilled Treatment Pt returns [facility] - she used strategy of crossing out word to keep place and she "got off track" 1x.  Attention to detail, organizing financial account in date order with frequent min A to cross off completed entry - Erika Stanton doubled checked amouth that  didn't seem correct with mod I.  She entered amounts into calculator to find balance. She initially required cues to go slow and double check amounts as she entered wrong amount and did catch it, but had to start over. 2nd attempt Erika Stanton double checked each amouth. Erika Stanton is monitoring her accounts successfully and she is completing household chores as she is physically able. She gives spouse instrcutions on where to put her clothes (problem solving) She has cleaned out closet and made 1 donation bag. She is packing up clothes she can't wear due to physical limitations with rare min A from spouse.      Assessment / Recommendations / Plan   Plan Continue with current plan of care      Progression Toward Goals   Progression toward goals Progressing toward goals               SLP Short Term Goals - 04/11/20 1140      SLP SHORT TERM GOAL #1   Title pt to complete cognitive linguistic testing    Period --   or 4 total sessions   Status Achieved      SLP SHORT TERM GOAL #2   Title pt will demo emergent awareness 100% with min complex therapy tasks x3 visits    Baseline 03-30-20;    Status Partially Met      SLP SHORT TERM GOAL #3   Title pt will demo appriopriate aspiration precuations with suggested POs x3 sessions    Status Deferred      SLP SHORT TERM GOAL #4   Title pt will solve household probelms using functional solutions x3 sessions    Baseline 03-30-20; 04-11-20    Status Achieved      SLP SHORT TERM GOAL #5   Title pt will demo selective attention (internal distraction) to a simple functional task for 10 minutes with rare min A back to task in 3 sessions    Baseline 03-28-20    Period Weeks    Status Partially Met            SLP Long Term Goals - 04/11/20 1144      SLP LONG TERM GOAL #1   Title pt will demo anticipatory awareness by double checking all work x5 sessions    Time 4    Period Weeks   or 17 total sessions, for all LTGs   Status On-going      SLP LONG TERM GOAL #2   Title pt will follow swallow precautions with POs with modified independence x3 sessions    Status Deferred      SLP LONG TERM GOAL #3   Title pt will demo [facility] skills in solving mod complex problems in WNL amount of time with modified independence (double checking answers , etc)    Time 4    Period Weeks    Status On-going      SLP LONG TERM GOAL #4   Title pt will demo altaernating attention in simple functional cognitive linguistic tasks in 3 sessions    Time 4    Period Weeks    Status On-going            Plan - 04/11/20 1139    Clinical Impression Statement Erika Stanton's impulsivity appears decr'd today, compared to last week. Her attention (selective) also appears to be improving. Awareness and simple executive function (problem solving)  deficits remain apparent. 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 She is leaning towards early retirement.    Speech Therapy Frequency 2x / week    Duration 8 weeks   17 visits   Treatment/Interventions Aspiration precaution training;Pharyngeal strengthening exercises;Compensatory techniques;Diet toleration management by SLP;Trials of upgraded texture/liquids;Cueing hierarchy;Cognitive reorganization;Internal/external aids;Patient/family education;SLP instruction and feedback;Functional tasks    Potential to Achieve Goals Good           Patient will benefit from skilled therapeutic intervention in order to improve the following deficits and impairments:   Cognitive communication deficit    Problem List Patient Active Problem List   Diagnosis Date Noted  . Dyspareunia in female 04/05/2020  . History of abnormal cervical Pap smear 03/10/2020  . GIB (gastrointestinal bleeding) 02/28/2020  . Elevated BUN   . Prediabetes   . 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 Cerebral vasospasm   . Sinus tachycardia   . Dysphagia, post-stroke   . Thrombocytopenia ([place])   . Acute blood loss anemia   . Brain aneurysm   . ICH (intracerebral hemorrhage) ([place]) 01/05/2020  . History of adenomatous polyp of colon 02/07/2016  . 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 Fissure in ano 11/11/2014  . Hemorrhoids 11/11/2014  . Constipation 11/19/2013  . GERD (gastroesophageal reflux disease) 03/24/2013  . Irritable bowel syndrome with diarrhea 03/24/2013  . Herpes zoster 05/14/2005  . Abnormal Pap smear of cervix 05/15/1983  . History of cervical dysplasia 05/15/1983    Erika Stanton, Annye Rusk MS, CCC-SLP 04/11/2020, 11:46 AM  [facility] [address] [place], [place], [address] Phone: [phone]   Fax:  [phone]   Name: Erika Stanton MRN: [id number] Date of Birth: 01-04-1956

## 2020-04-11 NOTE — Therapy (Signed)
[facility] [address] [place], [place], [address] Phone: [phone]   Fax:  [phone]  Occupational Therapy Treatment  Patient Details  Name: Erika Stanton MRN: [id number] Date of Birth: 01/12/1956 Referring Provider (OT): Dr. Naaman Plummer   Encounter Date: 04/11/2020   OT End of Session - 04/11/20 1252    Visit Number 11    Number of Visits 25    Date for OT Re-Evaluation 05/25/20    Authorization Time Period State BCBS    OT Start Time 1235   hot pack first 10 minutes   OT Stop Time 1315    OT Time Calculation (min) 40 min    Activity Tolerance Patient tolerated treatment well    Behavior During Therapy [facility] for tasks assessed/performed           Past Medical History:  Diagnosis Date  . Melena     Past Surgical History:  Procedure Laterality Date  . CHOLECYSTECTOMY    . COLONOSCOPY  11/2017   at [facility]. no recurrent polyps.  suggest repeat surveillance study 11/2022.    Marland Kitchen COLONOSCOPY W/ POLYPECTOMY  06/2014   Dr Roxy Manns at [facility].  3 adenomatoous polyps, anal fissure.    . ESOPHAGOGASTRODUODENOSCOPY (EGD) WITH PROPOFOL N/A 01/27/2020   Procedure: ESOPHAGOGASTRODUODENOSCOPY (EGD) WITH PROPOFOL;  Surgeon: Mauri Pole, MD;  Location: [facility];  Service: Endoscopy;  Laterality: N/A;  . IR 3D INDEPENDENT WKST  01/06/2020  . IR ANGIO INTRA EXTRACRAN SEL INTERNAL CAROTID BILAT MOD SED  01/06/2020  . IR ANGIO VERTEBRAL SEL VERTEBRAL UNI L MOD SED  01/06/2020  . IR ANGIOGRAM FOLLOW UP STUDY  01/06/2020  . IR ANGIOGRAM FOLLOW UP STUDY  01/06/2020  . IR ANGIOGRAM FOLLOW UP STUDY  01/06/2020  . IR ANGIOGRAM FOLLOW UP STUDY  01/06/2020  . IR ANGIOGRAM FOLLOW UP STUDY  01/06/2020  . IR ANGIOGRAM FOLLOW UP STUDY  01/06/2020  . IR ANGIOGRAM FOLLOW UP STUDY  01/06/2020  . IR ANGIOGRAM FOLLOW UP STUDY  01/06/2020  . IR ANGIOGRAM FOLLOW UP STUDY  01/06/2020  . IR ANGIOGRAM FOLLOW UP STUDY  01/06/2020  . IR NEURO EACH ADD'L  AFTER BASIC UNI RIGHT (MS)  01/06/2020  . IR TRANSCATH/EMBOLIZ  01/06/2020  . RADIOLOGY WITH ANESTHESIA N/A 01/06/2020   Procedure: IR WITH ANESTHESIA FOR ANEURYSM;  Surgeon: Consuella Lose, MD;  Location: [facility];  Service: Radiology;  Laterality: N/A;  . TUBAL LIGATION      There were no vitals filed for this visit.   Subjective Assessment - 04/11/20 1251    Pertinent History ICH 01/06/20, Aneurysm. PMH: OA bilateral knees and hands    Currently in Pain? Yes    Pain Score 2    Higher w/ movement   Pain Location Shoulder    Pain Orientation Right    Pain Descriptors / Indicators Aching    Pain Type Chronic pain    Pain Onset More than a month ago    Pain Frequency Constant    Aggravating Factors  moving it    Pain Relieving Factors resting it           Hot pack to Rt shoulder and hand at beginning of session. Therapist added longer proximal straps to splint while on hot pack. Following hot pack, stretch to Rt hand in flexion and extension, especially at PIP joints.  AA/ROM BUE's in low range shoulder flexion then table slides for mid range sh flexion.  NMES x 10 min.  For wrist/finger extensors 50 pps, 250 pw, 10 sec on/off cycle                       OT Short Term Goals - 03/21/20 1430      OT SHORT TERM GOAL #1   Title I with initialHEP.    Time 6    Period Weeks    Status Achieved    Target Date 04/13/20      OT SHORT TERM GOAL #2   Title Pt will perform bathing with min A.    Time 6    Period Weeks    Status New      OT SHORT TERM GOAL #3   Title Pt will donn shirt and pants with min A    Time 6    Period Weeks    Status New      OT SHORT TERM GOAL #4   Title Pt will demonstrate 30 A/ROM shoulder flexion for RUE with pain less than or equal to 4/10.    Time 6    Period Weeks    Status New      OT SHORT TERM GOAL #5   Title Pt will  demonstrate improved LUE fine motor coordination for ADLs as evidenced by decreasing 9 hole peg test score  to 45 secs or less.    Time 6    Period Weeks    Status New      OT SHORT TERM GOAL #6   Title Pt/ caregiver will verbalize understanding of RUE positioning and edema control techniques to minimize pain and risk for injury    Time 6    Period Weeks    Status New      OT SHORT TERM GOAL #7   Title Pt will demonstrate at least 25% finger flexion/ extension in RUE in prep for functional use.    Time 6    Period Weeks    Status New             OT Long Term Goals - 03/02/20 1527      OT LONG TERM GOAL #1   Title Pt will perfrom  all basic ADLS with supervision    Time 12    Period Weeks    Status New      OT LONG TERM GOAL #2   Title Pt will demonstrate 45* RUE shoulder flexion in prep for functional reach with pain less than or equal to 3/10.    Time 12    Period Weeks    Status New      OT LONG TERM GOAL #3   Title Pt will use RUE as a stabilizer/ gross A at least 30 % of the time for ADLs/ IADLs.    Time 12    Period Weeks    Status New      OT LONG TERM GOAL #4   Title Pt will increase LUE grip strength to 25 lbs or greater for increased ease with ADLs.    Time 12    Period Weeks    Status New      OT LONG TERM GOAL #5   Title Pt will perform simple home management/ snack prep with min A.    Time 12    Period Weeks    Status New      Long Term Additional Goals   Additional Long Term Goals Yes      OT LONG TERM GOAL #6  Title Pt will demonstrate ability to grasp/ release a cup 2/3 trials with RUE.    Time 12    Period Weeks    Status New      OT LONG TERM GOAL #7   Title Pt will safely navigate a busy environment and locate items with supervision and  90% or greater accuracy without bumping into items.    Time 12    Period Weeks    Status New      OT LONG TERM GOAL #8   Title I with updated HEP.    Time 12    Period Weeks    Status New                 Plan - 04/11/20 1309    Clinical Impression Statement Pt tolerating slightly more  stretch to Rt shoulder and hand with heat prior to therapy    Body Structure / Function / Physical Skills ADL;Balance;Coordination;Decreased knowledge of precautions;Decreased knowledge of use of DME;Dexterity;Edema;Mobility;Tone;Strength;IADL;Sensation;GMC;Gait;ROM;FMC;Flexibility;Pain;Vision;UE functional use;Endurance    Cognitive Skills Attention;Perception;Problem Solve;Safety Awareness;Thought    Rehab Potential Good    Clinical Decision Making Several treatment options, min-mod task modification necessary    OT Frequency 2x / week    OT Duration 12 weeks    OT Treatment/Interventions Self-care/ADL training;Ultrasound;Energy conservation;Visual/perceptual remediation/compensation;Patient/family education;DME and/or AE instruction;Aquatic Therapy;Paraffin;Gait Training;Passive range of motion;Balance training;Fluidtherapy;Electrical Stimulation;Functional Mobility Training;Splinting;Moist Heat;Therapeutic exercise;Manual Therapy;Cognitive remediation/compensation;Manual lymph drainage;Neuromuscular education;Coping strategies training    Plan continue RUE NMR and pain management    Consulted and Agree with Plan of Care Patient;Family member/caregiver    Family Member Consulted spouse present end of session           Patient will benefit from skilled therapeutic intervention in order to improve the following deficits and impairments:   Body Structure / Function / Physical Skills: ADL, Balance, Coordination, Decreased knowledge of precautions, Decreased knowledge of use of DME, Dexterity, Edema, Mobility, Tone, Strength, IADL, Sensation, GMC, Gait, ROM, FMC, Flexibility, Pain, Vision, UE functional use, Endurance Cognitive Skills: Attention, Perception, Problem Solve, Safety Awareness, Thought     Visit Diagnosis: Acute pain of right shoulder  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 Dyspareunia in female 04/05/2020  . History of abnormal cervical Pap smear 03/10/2020  . GIB (gastrointestinal bleeding) 02/28/2020  . Elevated BUN   . Prediabetes   . 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[facility]) 01/20/2020  . Cerebral vasospasm   . Sinus tachycardia   . Dysphagia, post-stroke   . Thrombocytopenia ([facility])   . Acute blood loss anemia   . Brain aneurysm   . ICH (intracerebral hemorrhage) ([facility]) 01/05/2020  . History of adenomatous polyp of colon 02/07/2016  . 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 Fissure in ano 11/11/2014  . Hemorrhoids 11/11/2014  . Constipation 11/19/2013  . GERD (gastroesophageal reflux disease) 03/24/2013  . Irritable bowel syndrome with diarrhea 03/24/2013  . Herpes zoster 05/14/2005  . Abnormal Pap smear of cervix 05/15/1983  . History of cervical dysplasia 05/15/1983    Carey Bullocks 04/11/2020, 1:11 PM  [facility] [address] [place], [place], [address] Phone: [phone]   Fax:  [phone]  Name: Erika Stanton MRN: [id number] Date of Birth: 12-21-1955

## 2020-04-11 NOTE — Telephone Encounter (Signed)
Pt called today stating that her provider wants her to go back on meloxicam for her shoulder pain. Pt wants to make sure that is ok to start taking this medication due to she will be have an ingrown procedure on 05/03/20. Please advise.

## 2020-04-13 ENCOUNTER — Encounter: Payer: Self-pay | Admitting: Speech Pathology

## 2020-04-13 ENCOUNTER — Ambulatory Visit: Payer: BC Managed Care – PPO | Admitting: Occupational Therapy

## 2020-04-13 ENCOUNTER — Ambulatory Visit: Payer: BC Managed Care – PPO | Attending: Physical Medicine & Rehabilitation

## 2020-04-13 ENCOUNTER — Ambulatory Visit: Payer: BC Managed Care – PPO | Admitting: Speech Pathology

## 2020-04-13 ENCOUNTER — Other Ambulatory Visit: Payer: Self-pay

## 2020-04-13 DIAGNOSIS — R2689 Other abnormalities of gait and mobility: Secondary | ICD-10-CM

## 2020-04-13 DIAGNOSIS — I69159 Hemiplegia and hemiparesis following nontraumatic intracerebral hemorrhage affecting unspecified side: Secondary | ICD-10-CM | POA: Diagnosis present

## 2020-04-13 DIAGNOSIS — M25511 Pain in right shoulder: Secondary | ICD-10-CM | POA: Insufficient documentation

## 2020-04-13 DIAGNOSIS — M79641 Pain in right hand: Secondary | ICD-10-CM | POA: Insufficient documentation

## 2020-04-13 DIAGNOSIS — M6281 Muscle weakness (generalized): Secondary | ICD-10-CM | POA: Diagnosis present

## 2020-04-13 DIAGNOSIS — R278 Other lack of coordination: Secondary | ICD-10-CM | POA: Insufficient documentation

## 2020-04-13 DIAGNOSIS — R41841 Cognitive communication deficit: Secondary | ICD-10-CM | POA: Insufficient documentation

## 2020-04-13 DIAGNOSIS — R2681 Unsteadiness on feet: Secondary | ICD-10-CM | POA: Insufficient documentation

## 2020-04-13 DIAGNOSIS — R41844 Frontal lobe and executive function deficit: Secondary | ICD-10-CM | POA: Insufficient documentation

## 2020-04-13 DIAGNOSIS — R41840 Attention and concentration deficit: Secondary | ICD-10-CM | POA: Diagnosis present

## 2020-04-13 NOTE — Therapy (Signed)
[facility] [address] [place], [place], [address] Phone: [phone]   Fax:  [phone]  Physical Therapy Treatment  Patient Details  Name: Erika Stanton MRN: [id number] Date of Birth: 02-19-1956 Referring Provider (PT): Alger Simons, MD   Encounter Date: 04/13/2020   PT End of Session - 04/13/20 1149    Visit Number 18    Number of Visits 21    Date for PT Re-Evaluation 04/25/20    Authorization Type BCBS State    PT Start Time 1148    PT Stop Time 1232    PT Time Calculation (min) 44 min    Equipment Utilized During Treatment Gait belt    Activity Tolerance Patient tolerated treatment well    Behavior During Therapy [facility] for tasks assessed/performed           Past Medical History:  Diagnosis Date  . Melena     Past Surgical History:  Procedure Laterality Date  . CHOLECYSTECTOMY    . COLONOSCOPY  11/2017   at [facility]. no recurrent polyps.  suggest repeat surveillance study 11/2022.    Marland Kitchen COLONOSCOPY W/ POLYPECTOMY  06/2014   Dr Roxy Manns at [facility].  3 adenomatoous polyps, anal fissure.    . ESOPHAGOGASTRODUODENOSCOPY (EGD) WITH PROPOFOL N/A 01/27/2020   Procedure: ESOPHAGOGASTRODUODENOSCOPY (EGD) WITH PROPOFOL;  Surgeon: Mauri Pole, MD;  Location: [facility];  Service: Endoscopy;  Laterality: N/A;  . IR 3D INDEPENDENT WKST  01/06/2020  . IR ANGIO INTRA EXTRACRAN SEL INTERNAL CAROTID BILAT MOD SED  01/06/2020  . IR ANGIO VERTEBRAL SEL VERTEBRAL UNI L MOD SED  01/06/2020  . IR ANGIOGRAM FOLLOW UP STUDY  01/06/2020  . IR ANGIOGRAM FOLLOW UP STUDY  01/06/2020  . IR ANGIOGRAM FOLLOW UP STUDY  01/06/2020  . IR ANGIOGRAM FOLLOW UP STUDY  01/06/2020  . IR ANGIOGRAM FOLLOW UP STUDY  01/06/2020  . IR ANGIOGRAM FOLLOW UP STUDY  01/06/2020  . IR ANGIOGRAM FOLLOW UP STUDY  01/06/2020  . IR ANGIOGRAM FOLLOW UP STUDY  01/06/2020  . IR ANGIOGRAM FOLLOW UP STUDY  01/06/2020  . IR ANGIOGRAM FOLLOW UP STUDY  01/06/2020  .  IR NEURO EACH ADD'L AFTER BASIC UNI RIGHT (MS)  01/06/2020  . IR TRANSCATH/EMBOLIZ  01/06/2020  . RADIOLOGY WITH ANESTHESIA N/A 01/06/2020   Procedure: IR WITH ANESTHESIA FOR ANEURYSM;  Surgeon: Consuella Lose, MD;  Location: [facility];  Service: Radiology;  Laterality: N/A;  . TUBAL LIGATION      There were no vitals filed for this visit.   Subjective Assessment - 04/13/20 1149    Subjective Pt reports that she goes to orthopedic doctor at [facility] tomorrow about her shoulder, Dr. Windell Moment.    Patient is accompained by: Family member    Limitations Walking;Standing;House hold activities    How long can you walk comfortably? Did a little walking yesterday up/down the hallway    Patient Stated Goals Use my right hand like I used to.    Currently in Pain? Yes    Pain Score 2     Pain Location Shoulder    Pain Orientation Right    Pain Descriptors / Indicators Aching    Pain Type Chronic pain    Pain Onset More than a month ago                             [facility] Adult PT Treatment/Exercise - 04/13/20 1153  Ambulation/Gait   Ambulation/Gait Yes    Ambulation/Gait Assistance 5: Supervision    Ambulation/Gait Assistance Details Pt was cued to relax right arm. During last 75' pt performed dynamic gait activities as follows: 66' with marching with cues to go slow and controlled with setting feet down, head turns left/right on command x 115', tandem gait 40' x 2 with CGA.     Ambulation Distance (Feet) 690 Feet    Assistive device None    Gait Pattern Step-through pattern    Ambulation Surface Level;Indoor      Neuro Re-ed    Neuro Re-ed Details  Walking over blue mat with 3 orange hurdles: reciprocal steps  x 3 laps down and back with verbal cues to watch foot placement as tends to turn in feet, side stepping x 3 laps down and back. Standing on rockerboard positioned ant/post with airex on top trying to maintain level 30 sec x 2 CGA. Pt tends to lean posterior so cued to  shift more towards toes. Standing on rockerboard positioned ant/post with fingertip support alternating tapping cone x 10 each leg. Standing on rockerboard positioned lateral maintaining level x 30 sec then 30 sec with head turns left/right.      Lumbar Exercises: Aerobic   Other Aerobic Exercise Sci-Fit x 8 min legs only level 4 for improved activity tolerance and strengthening.                    PT Short Term Goals - 03/23/20 1335      PT SHORT TERM GOAL #1   Title Pt/spouse will be independent with initial HEP in order to indicate decreased fall risk and improved functional mobility.  (Target Date: 03/26/20)    Baseline performing most of HEP 03/21/20    Time 4    Period Weeks    Status Achieved    Target Date 03/26/20      PT SHORT TERM GOAL #2   Title Pt will complete BERG balance test and improve score by 6 points from baseline in order to indicate decreased fall risk.    Baseline 46/56 on 03/01/20; 54/56 03/21/20    Time 4    Period Weeks    Status Achieved      PT SHORT TERM GOAL #3   Title Pt will improve 5TSS to </=25 secs with single UE support in order to indicate dec fall risk and improved functional strength.    Baseline 16 sec with one UE (03/21/20)    Time 4    Period Weeks    Status Achieved      PT SHORT TERM GOAL #4   Title Pt will improve gait speed to >/=2.4 ft/sec with LRAD in order to indicate dec fall risk.    Baseline 03/23/20 3.21 ft/sec    Time 4    Period Weeks    Status Achieved      PT SHORT TERM GOAL #5   Title Pt will ambulate x 14' at S level with LRAD in simulated household environment in order to indicate improved independence in her home.    Baseline 1050' without AD    Time 4    Period Weeks    Status Achieved             PT Long Term Goals - 03/23/20 1422      PT LONG TERM GOAL #1   Title Pt will be independent with progressive HEP for balance and strengthening to continue gains on own.  Time 8    Period Weeks     Status Revised    Target Date 04/25/20      PT LONG TERM GOAL #2   Title Pt will increase FGA from 19/30 to >24/30 for improved balance and gait to decrease fall risk.    Baseline 03/23/20 19/30    Time 8    Period Weeks    Status New    Target Date 04/25/20      PT LONG TERM GOAL #3   Title Pt will perform 5TSS in </=13 secs without UE support in order to indicate improved functional strength    Baseline 16 seconds with L UE support 03/21/20    Time 8    Period Weeks    Status Revised    Target Date 04/25/20      PT LONG TERM GOAL #4   Title Pt will ambulate >1000' on varied surfaces independently with no LOB for improved community ambulation without AD.    Baseline 1050' including >300 feet of grass surface with SBA 03/21/20; we worked on ramp and curb also.    Time 8    Period Weeks    Status Revised    Target Date 04/25/20                 Plan - 04/13/20 1259    Clinical Impression Statement PT continued to focus on high level balance activities. Pt most challenged on rockerboard activity with keeping weight more posterior. She continues to show improved stability with all gait activities.    Personal Factors and Comorbidities Age;Comorbidity 1;Profession    Comorbidities B ICHs    Examination-Activity Limitations Bend;Carry;Lift;Locomotion Level;Reach Overhead;Squat;Stairs;Stand;Transfers    Examination-Participation Restrictions Cleaning;Community Activity;Driving;Occupation;Yard Work;Laundry;Meal Prep    Stability/Clinical Decision Making Evolving/Moderate complexity    Rehab Potential Good    PT Frequency 3x / week   then 2x/wk for 4 weeks   PT Duration 4 weeks   then 2x/wk for 4 weeks   PT Treatment/Interventions ADLs/Self Care Home Management;Aquatic Therapy;Electrical Stimulation;DME Instruction;Gait training;Stair training;Functional mobility training;Therapeutic activities;Therapeutic exercise;Balance training;Neuromuscular re-education;Cognitive  remediation;Patient/family education;Orthotic Fit/Training;Manual techniques;Passive range of motion;Dry needling;Energy conservation;Vestibular;Visual/perceptual remediation/compensation    PT Next Visit Plan How did visit with orthopedic doctor go?Last 2 visits here before transfer next week. Start to check goals to decide if we need to recert before she transfers.  Continue with dynamic gait and balance activities. Activities to try to incorporate more core and shoulder activation as able, LLE NMR.    Consulted and Agree with Plan of Care Patient;Family member/caregiver           Patient will benefit from skilled therapeutic intervention in order to improve the following deficits and impairments:  Abnormal gait, Decreased activity tolerance, Decreased balance, Decreased cognition, Decreased endurance, Decreased knowledge of use of DME, Decreased mobility, Decreased strength, Impaired perceived functional ability, Impaired flexibility, Impaired sensation, Postural dysfunction, Improper body mechanics, Impaired vision/preception, Impaired UE functional use, Impaired tone  Visit Diagnosis: Other abnormalities of gait and mobility  Muscle weakness (generalized)     Problem List Patient Active Problem List   Diagnosis Date Noted  . Dyspareunia in female 04/05/2020  . History of abnormal cervical Pap smear 03/10/2020  . GIB (gastrointestinal bleeding) 02/28/2020  . Elevated BUN   . Prediabetes   . 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[facility]) 01/20/2020  . Cerebral vasospasm   . Sinus tachycardia   . Dysphagia, post-stroke   . Thrombocytopenia ([facility])   . Acute blood loss anemia   .  Brain aneurysm   . ICH (intracerebral hemorrhage) ([place]) 01/05/2020  . History of adenomatous polyp of colon 02/07/2016  . 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 Fissure in ano 11/11/2014  . Hemorrhoids 11/11/2014  . Constipation 11/19/2013  . GERD (gastroesophageal reflux disease) 03/24/2013  . Irritable  bowel syndrome with diarrhea 03/24/2013  . Herpes zoster 05/14/2005  . Abnormal Pap smear of cervix 05/15/1983  . History of cervical dysplasia 05/15/1983    Electa Sniff, PT, DPT, NCS 04/13/2020, 1:04 PM  [facility] [address] [place], [place], [address] Phone: [phone]   Fax:  [phone]  Name: Erika Stanton MRN: [id number] Date of Birth: 1956-03-21

## 2020-04-13 NOTE — Therapy (Signed)
[facility] [address] [place], [place], [address] Phone: [phone]   Fax:  [phone]  Speech Language Pathology Treatment  Patient Details  Name: Erika Stanton MRN: [id number] Date of Birth: 1956/02/28 Referring Provider (SLP): Erika Simons, MD   Encounter Date: 04/13/2020   End of Session - 04/13/20 1138    Visit Number 12    Number of Visits 17    Date for SLP Re-Evaluation 05/27/20    SLP Start Time 24    SLP Stop Time  1145    SLP Time Calculation (min) 43 min    Activity Tolerance Patient tolerated treatment well           Past Medical History:  Diagnosis Date  . Melena     Past Surgical History:  Procedure Laterality Date  . CHOLECYSTECTOMY    . COLONOSCOPY  11/2017   at [facility]. no recurrent polyps.  suggest repeat surveillance study 11/2022.    Marland Kitchen COLONOSCOPY W/ POLYPECTOMY  06/2014   Dr Erika Stanton at [facility].  3 adenomatoous polyps, anal fissure.    . ESOPHAGOGASTRODUODENOSCOPY (EGD) WITH PROPOFOL N/A 01/27/2020   Procedure: ESOPHAGOGASTRODUODENOSCOPY (EGD) WITH PROPOFOL;  Surgeon: Erika Pole, MD;  Location: [facility];  Service: Endoscopy;  Laterality: N/A;  . IR 3D INDEPENDENT WKST  01/06/2020  . IR ANGIO INTRA EXTRACRAN SEL INTERNAL CAROTID BILAT MOD SED  01/06/2020  . IR ANGIO VERTEBRAL SEL VERTEBRAL UNI L MOD SED  01/06/2020  . IR ANGIOGRAM FOLLOW UP STUDY  01/06/2020  . IR ANGIOGRAM FOLLOW UP STUDY  01/06/2020  . IR ANGIOGRAM FOLLOW UP STUDY  01/06/2020  . IR ANGIOGRAM FOLLOW UP STUDY  01/06/2020  . IR ANGIOGRAM FOLLOW UP STUDY  01/06/2020  . IR ANGIOGRAM FOLLOW UP STUDY  01/06/2020  . IR ANGIOGRAM FOLLOW UP STUDY  01/06/2020  . IR ANGIOGRAM FOLLOW UP STUDY  01/06/2020  . IR ANGIOGRAM FOLLOW UP STUDY  01/06/2020  . IR ANGIOGRAM FOLLOW UP STUDY  01/06/2020  . IR NEURO EACH ADD'L AFTER BASIC UNI RIGHT (MS)  01/06/2020  . IR TRANSCATH/EMBOLIZ  01/06/2020  . RADIOLOGY WITH ANESTHESIA N/A  01/06/2020   Procedure: IR WITH ANESTHESIA FOR ANEURYSM;  Surgeon: Erika Lose, MD;  Location: [facility];  Service: Radiology;  Laterality: N/A;  . TUBAL LIGATION      There were no vitals filed for this visit.   Subjective Assessment - 04/13/20 1112    Subjective "We have a lot going on - our cat is very sick"    Currently in Pain? Yes    Pain Score 4     Pain Location Shoulder    Pain Orientation Right    Pain Descriptors / Indicators Aching    Pain Type Chronic pain    Pain Onset More than a month ago    Pain Frequency Constant    Aggravating Factors  OT/ exercise    Pain Relieving Factors rest                 ADULT SLP TREATMENT - 04/13/20 1114      General Information   Behavior/Cognition Alert;Cooperative;Impulsive;Pleasant mood      Treatment Provided   Treatment provided Cognitive-Linquistic      Cognitive-Linquistic Treatment   Treatment focused on Cognition;Patient/family/caregiver education    Skilled Treatment Pt verbalizes strategies for anticipatory awareness and impulsiveness, Problem solving with moderately complex financial, attention to detail and mental money math with initial cues to double check and  note details in questions, pt completed task with mod I and double checked independently. When she couldn't do mental math, Erika Stanton used calculator on her phone independently. Moderately complex bank schedule and time math and problem solving with occasional min A. for attention to detail and problem solving and 95% accuracy. Erika Stanton consistently re-read and double checked with rare min A. Erika Stanton continues to participate in household chores as she is physically able.       Assessment / Recommendations / Plan   Plan Continue with current plan of care      Progression Toward Goals   Progression toward goals Progressing toward goals              SLP Short Term Goals - 04/13/20 1137      SLP SHORT TERM GOAL #1   Title pt to complete cognitive  linguistic testing    Period --   or 4 total sessions   Status Achieved      SLP SHORT TERM GOAL #2   Title pt will demo emergent awareness 100% with min complex therapy tasks x3 visits    Baseline 03-30-20;    Status Partially Met      SLP SHORT TERM GOAL #3   Title pt will demo appriopriate aspiration precuations with suggested POs x3 sessions    Status Deferred      SLP SHORT TERM GOAL #4   Title pt will solve household probelms using functional solutions x3 sessions    Baseline 03-30-20; 04-11-20    Status Achieved      SLP SHORT TERM GOAL #5   Title pt will demo selective attention (internal distraction) to a simple functional task for 10 minutes with rare min A back to task in 3 sessions    Baseline 03-28-20    Period Weeks    Status Partially Met            SLP Long Term Goals - 04/13/20 1137      SLP LONG TERM GOAL #1   Title pt will demo anticipatory awareness by double checking all work x5 sessions    Time 4    Period Weeks   or 17 total sessions, for all LTGs   Status On-going      SLP LONG TERM GOAL #2   Title pt will follow swallow precautions with POs with modified independence x3 sessions    Status Deferred      SLP LONG TERM GOAL #3   Title pt will demo [facility] skills in solving mod complex problems in WNL amount of time with modified independence (double checking answers , etc)    Time 4    Period Weeks    Status On-going      SLP LONG TERM GOAL #4   Title pt will demo altaernating attention in simple functional cognitive linguistic tasks in 3 sessions    Time 4    Period Weeks    Status On-going            Plan - 04/13/20 1135    Clinical Impression Statement Soley's impulsivity appears decr'd , compared to last week. Her attention (selective) also appears to be improving. Awareness and simple executive function (problem solving) deficits remain apparent. 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 She is leaning towards early retirement.    Speech Therapy Frequency 2x / week    Duration 8 weeks   17 visits   Treatment/Interventions Aspiration precaution training;Pharyngeal strengthening exercises;Compensatory techniques;Diet toleration management by SLP;Trials of upgraded texture/liquids;Cueing hierarchy;Cognitive reorganization;Internal/external aids;Patient/family education;SLP instruction and feedback;Functional tasks    Potential to Achieve Goals Good           Patient will benefit from skilled therapeutic intervention in order to improve the following deficits and impairments:   Cognitive communication deficit    Problem List Patient Active Problem List   Diagnosis Date Noted  . Dyspareunia in female 04/05/2020  . History of abnormal cervical Pap smear 03/10/2020  . GIB (gastrointestinal bleeding) 02/28/2020  . Elevated BUN   . Prediabetes   . 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 Cerebral vasospasm   . Sinus tachycardia   . Dysphagia, post-stroke   . Thrombocytopenia ([place])   . Acute blood loss anemia   . Brain aneurysm   . ICH (intracerebral hemorrhage) ([place]) 01/05/2020  . History of adenomatous polyp of colon 02/07/2016  . 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 Fissure in ano 11/11/2014  . Hemorrhoids 11/11/2014  . Constipation 11/19/2013  . GERD (gastroesophageal reflux disease) 03/24/2013  . Irritable bowel syndrome with diarrhea 03/24/2013  . Herpes zoster 05/14/2005  . Abnormal Pap smear of cervix 05/15/1983  . History of cervical dysplasia 05/15/1983    Erika Stanton, Erika Rusk MS, CCC-SLP 04/13/2020, 11:47 AM  [facility] [address] [place], [place], [address] Phone: [phone]   Fax:   [phone]   Name: Erika Stanton MRN: [id number] Date of Birth: [birth year]

## 2020-04-13 NOTE — Therapy (Signed)
[facility] [address] [place], [place], [address] Phone: [phone]   Fax:  [phone]  Occupational Therapy Treatment  Patient Details  Name: Erika Stanton MRN: [id number] Date of Birth: 11-03-1955 Referring Provider (OT): Dr. Naaman Plummer   Encounter Date: 04/13/2020   OT End of Session - 04/13/20 1058    Visit Number 12    Number of Visits 26   12+24=26   Date for OT Re-Evaluation 06/08/20    Authorization Time Period State BC[employer]    OT Start Time 1020    OT Stop Time 1100    OT Time Calculation (min) 40 min    Activity Tolerance Patient tolerated treatment well    Behavior During Therapy [facility] for tasks assessed/performed           Past Medical History:  Diagnosis Date  . Melena     Past Surgical History:  Procedure Laterality Date  . CHOLECYSTECTOMY    . COLONOSCOPY  11/2017   at [facility]. no recurrent polyps.  suggest repeat surveillance study 11/2022.    Marland Kitchen COLONOSCOPY W/ POLYPECTOMY  06/2014   Dr Roxy Manns at [facility].  3 adenomatoous polyps, anal fissure.    . ESOPHAGOGASTRODUODENOSCOPY (EGD) WITH PROPOFOL N/A 01/27/2020   Procedure: ESOPHAGOGASTRODUODENOSCOPY (EGD) WITH PROPOFOL;  Surgeon: Mauri Pole, MD;  Location: [facility];  Service: Endoscopy;  Laterality: N/A;  . IR 3D INDEPENDENT WKST  01/06/2020  . IR ANGIO INTRA EXTRACRAN SEL INTERNAL CAROTID BILAT MOD SED  01/06/2020  . IR ANGIO VERTEBRAL SEL VERTEBRAL UNI L MOD SED  01/06/2020  . IR ANGIOGRAM FOLLOW UP STUDY  01/06/2020  . IR ANGIOGRAM FOLLOW UP STUDY  01/06/2020  . IR ANGIOGRAM FOLLOW UP STUDY  01/06/2020  . IR ANGIOGRAM FOLLOW UP STUDY  01/06/2020  . IR ANGIOGRAM FOLLOW UP STUDY  01/06/2020  . IR ANGIOGRAM FOLLOW UP STUDY  01/06/2020  . IR ANGIOGRAM FOLLOW UP STUDY  01/06/2020  . IR ANGIOGRAM FOLLOW UP STUDY  01/06/2020  . IR ANGIOGRAM FOLLOW UP STUDY  01/06/2020  . IR ANGIOGRAM FOLLOW UP STUDY  01/06/2020  . IR NEURO EACH ADD'L AFTER BASIC UNI  RIGHT (MS)  01/06/2020  . IR TRANSCATH/EMBOLIZ  01/06/2020  . RADIOLOGY WITH ANESTHESIA N/A 01/06/2020   Procedure: IR WITH ANESTHESIA FOR ANEURYSM;  Surgeon: Consuella Lose, MD;  Location: [facility];  Service: Radiology;  Laterality: N/A;  . TUBAL LIGATION      There were no vitals filed for this visit.   Subjective Assessment - 04/13/20 1124    Subjective  Pt reports right shoulder pain and hand pain    Pertinent History ICH 01/06/20, Aneurysm. PMH: OA bilateral knees and hands    Currently in Pain? Yes    Pain Score 3     Pain Location Shoulder   hand   Pain Orientation Right    Pain Descriptors / Indicators Aching    Pain Type Chronic pain    Pain Onset More than a month ago    Pain Frequency Constant    Aggravating Factors  movement    Pain Relieving Factors rest                     Treatment: Therapist discussed progress and started checking goals while heat on R shoulder, and hand time grossly 10 mins, no adverse reactions. 9 hole peg test performed for LUE, and pt met goal. Discussed ADL performance and new strapping added to splint.  Pt performed gentle self stretch reach for floor and table slides, min-mod v.c, self P/ROM to digits and forearm in supination / pronation, and body on arm movements rocking side to side.  NMES to finger and wrist extensors x 8 mins 50 pps, 250 pw, 10 secs cycle, intensity 40 with pt performing finger and wrist ext during on cycle and finger flexion during off cycle.              OT Short Term Goals - 04/13/20 1031      OT SHORT TERM GOAL #1   Title I with initial HEP.    Time --    Period Weeks    Status Achieved    Target Date 04/13/20      OT SHORT TERM GOAL #2   Title Pt will perform bathing with min A.    Time --    Period Weeks    Status Achieved   Pt reports showering with min A     OT SHORT TERM GOAL #3   Title Pt will donn shirt and pants with min A    Time 4    Period Weeks    Status On-going   mod A      OT SHORT TERM GOAL #4   Title Pt will demonstrate 30 A/ROM shoulder flexion for RUE with pain less than or equal to 4/10.    Time 4    Period Weeks    Status On-going      OT SHORT TERM GOAL #5   Title Pt will  demonstrate improved LUE fine motor coordination for ADLs as evidenced by decreasing 9 hole peg test score to 45 secs or less.    Time --    Period Weeks    Status Achieved   42.34     OT SHORT TERM GOAL #6   Title Pt/ caregiver will verbalize understanding of RUE positioning and edema control techniques to minimize pain and risk for injury    Time 4    Period Weeks    Status On-going   pt uses a pillow, she has splint, needs reinforcement     OT SHORT TERM GOAL #7   Title Pt will demonstrate at least 25% finger flexion/ extension in RUE in prep for functional use.    Time 4    Period Weeks    Status On-going   grossly 20%, not consistent            OT Long Term Goals - 04/13/20 1122      OT LONG TERM GOAL #1   Title Pt will perfrom  all basic ADLS with supervision    Time 8    Period Weeks    Status On-going      OT LONG TERM GOAL #2   Title Pt will demonstrate 45* RUE shoulder flexion in prep for functional reach with pain less than or equal to 3/10.    Time 8    Period Weeks    Status On-going      OT LONG TERM GOAL #3   Title Pt will use RUE as a stabilizer/ gross A at least 30 % of the time for ADLs/ IADLs.    Period Weeks    Status On-going      OT LONG TERM GOAL #4   Title Pt will increase LUE grip strength to 25 lbs or greater for increased ease with ADLs.    Time 8    Period Weeks  Status On-going      OT LONG TERM GOAL #5   Title Pt will perform simple home management/ snack prep with min A.    Time 8    Period Weeks    Status On-going      OT LONG TERM GOAL #6   Title Pt will demonstrate ability to grasp/ release a cup 2/3 trials with RUE.    Time 8    Period Weeks    Status On-going      OT LONG TERM GOAL #7   Title Pt will  safely navigate a busy environment and locate items with supervision and  90% or greater accuracy without bumping into items.    Time 8    Period Weeks    Status On-going      OT LONG TERM GOAL #8   Title I with updated HEP.    Time 8    Period Weeks    Status On-going                 Plan - 04/13/20 1055    Clinical Impression Statement Pt is progressing slowly towards goals due to shoulder and hand pain. Pt remains very fearful of movements. Pt can benefit from continued skilled occupational therapy to address :RUE pain, weakness, decreased LUE coordination, ROM in order to maximize pt's safety and I with ADLs/ IADLS. Pt will be transitioning her care to [facility] later this month as it is closer to home.    OT Occupational Profile and History Detailed Assessment- Review of Records and additional review of physical, cognitive, psychosocial history related to current functional performance    Occupational performance deficits (Please refer to evaluation for details): ADL's;IADL's;Rest and Sleep;Work;Play;Leisure    Body Structure / Function / Physical Skills ADL;Balance;Coordination;Decreased knowledge of precautions;Decreased knowledge of use of DME;Dexterity;Edema;Mobility;Tone;Strength;IADL;Sensation;GMC;Gait;ROM;FMC;Flexibility;Pain;Vision;UE functional use;Endurance    Cognitive Skills Attention;Perception;Problem Solve;Safety Awareness;Thought    Rehab Potential Good    OT Frequency 3x / week    OT Duration 8 weeks    OT Treatment/Interventions Self-care/ADL training;Ultrasound;Energy conservation;Visual/perceptual remediation/compensation;Patient/family education;DME and/or AE instruction;Aquatic Therapy;Paraffin;Gait Training;Passive range of motion;Balance training;Fluidtherapy;Electrical Stimulation;Functional Mobility Training;Splinting;Moist Heat;Therapeutic exercise;Manual Therapy;Cognitive remediation/compensation;Manual lymph drainage;Neuromuscular education;Coping strategies  training    Plan renewal completed as pt frequency was increased to 3x week.    Consulted and Agree with Plan of Care Patient;Family member/caregiver           Patient will benefit from skilled therapeutic intervention in order to improve the following deficits and impairments:   Body Structure / Function / Physical Skills: ADL, Balance, Coordination, Decreased knowledge of precautions, Decreased knowledge of use of DME, Dexterity, Edema, Mobility, Tone, Strength, IADL, Sensation, GMC, Gait, ROM, FMC, Flexibility, Pain, Vision, UE functional use, Endurance Cognitive Skills: Attention, Perception, Problem Solve, Safety Awareness, Thought     Visit Diagnosis: Acute pain of right shoulder - Plan: Ot plan of care cert/re-cert  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 Dyspareunia in female 04/05/2020  . History of abnormal cervical Pap smear 03/10/2020  . GIB (gastrointestinal bleeding) 02/28/2020  . Elevated BUN   . Prediabetes   . 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 Cerebral vasospasm   . Sinus tachycardia   . Dysphagia, post-stroke   . Thrombocytopenia ([place])   . Acute blood loss anemia   . Brain aneurysm   . ICH (intracerebral hemorrhage) ([place]) 01/05/2020  . History of adenomatous polyp of colon 02/07/2016  . 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 Fissure in ano 11/11/2014  . Hemorrhoids  11/11/2014  . Constipation 11/19/2013  . GERD (gastroesophageal reflux disease) 03/24/2013  . Irritable bowel syndrome with diarrhea 03/24/2013  . Herpes zoster 05/14/2005  . Abnormal Pap smear of cervix 05/15/1983  . History of cervical dysplasia 05/15/1983    Erika Stanton 04/13/2020, 11:37 AM  [facility] [address] [place], [place], [address] Phone: [phone]   Fax:  [phone]  Name: Erika Stanton MRN: [id number] Date of Birth: 1955-11-05

## 2020-04-15 ENCOUNTER — Encounter: Payer: Self-pay | Admitting: Occupational Therapy

## 2020-04-15 ENCOUNTER — Ambulatory Visit: Payer: BC Managed Care – PPO | Admitting: Occupational Therapy

## 2020-04-15 ENCOUNTER — Ambulatory Visit: Payer: BC Managed Care – PPO

## 2020-04-15 ENCOUNTER — Other Ambulatory Visit: Payer: Self-pay

## 2020-04-15 DIAGNOSIS — R2689 Other abnormalities of gait and mobility: Secondary | ICD-10-CM | POA: Diagnosis not present

## 2020-04-15 DIAGNOSIS — R41840 Attention and concentration deficit: Secondary | ICD-10-CM

## 2020-04-15 DIAGNOSIS — R278 Other lack of coordination: Secondary | ICD-10-CM

## 2020-04-15 DIAGNOSIS — M25511 Pain in right shoulder: Secondary | ICD-10-CM

## 2020-04-15 DIAGNOSIS — R41844 Frontal lobe and executive function deficit: Secondary | ICD-10-CM

## 2020-04-15 DIAGNOSIS — M6281 Muscle weakness (generalized): Secondary | ICD-10-CM

## 2020-04-15 DIAGNOSIS — M79641 Pain in right hand: Secondary | ICD-10-CM

## 2020-04-15 NOTE — Therapy (Signed)
[facility] [address] [place], [place], [address] Phone: [phone]   Fax:  [phone]  Occupational Therapy Treatment  Patient Details  Name: Erika Stanton MRN: [id number] Date of Birth: 1956/02/03 Referring Provider (OT): Dr. Naaman Plummer   Encounter Date: 04/15/2020   OT End of Session - 04/15/20 1704    Visit Number 13    Number of Visits 26    Date for OT Re-Evaluation 06/08/20    Authorization Time Period State BCBS    OT Start Time 7124    OT Stop Time 1230    OT Time Calculation (min) 42 min           Past Medical History:  Diagnosis Date  . Melena     Past Surgical History:  Procedure Laterality Date  . CHOLECYSTECTOMY    . COLONOSCOPY  11/2017   at [facility]. no recurrent polyps.  suggest repeat surveillance study 11/2022.    Marland Kitchen COLONOSCOPY W/ POLYPECTOMY  06/2014   Dr Roxy Manns at [facility].  3 adenomatoous polyps, anal fissure.    . ESOPHAGOGASTRODUODENOSCOPY (EGD) WITH PROPOFOL N/A 01/27/2020   Procedure: ESOPHAGOGASTRODUODENOSCOPY (EGD) WITH PROPOFOL;  Surgeon: Mauri Pole, MD;  Location: [facility];  Service: Endoscopy;  Laterality: N/A;  . IR 3D INDEPENDENT WKST  01/06/2020  . IR ANGIO INTRA EXTRACRAN SEL INTERNAL CAROTID BILAT MOD SED  01/06/2020  . IR ANGIO VERTEBRAL SEL VERTEBRAL UNI L MOD SED  01/06/2020  . IR ANGIOGRAM FOLLOW UP STUDY  01/06/2020  . IR ANGIOGRAM FOLLOW UP STUDY  01/06/2020  . IR ANGIOGRAM FOLLOW UP STUDY  01/06/2020  . IR ANGIOGRAM FOLLOW UP STUDY  01/06/2020  . IR ANGIOGRAM FOLLOW UP STUDY  01/06/2020  . IR ANGIOGRAM FOLLOW UP STUDY  01/06/2020  . IR ANGIOGRAM FOLLOW UP STUDY  01/06/2020  . IR ANGIOGRAM FOLLOW UP STUDY  01/06/2020  . IR ANGIOGRAM FOLLOW UP STUDY  01/06/2020  . IR ANGIOGRAM FOLLOW UP STUDY  01/06/2020  . IR NEURO EACH ADD'L AFTER BASIC UNI RIGHT (MS)  01/06/2020  . IR TRANSCATH/EMBOLIZ  01/06/2020  . RADIOLOGY WITH ANESTHESIA N/A 01/06/2020   Procedure: IR WITH  ANESTHESIA FOR ANEURYSM;  Surgeon: Consuella Lose, MD;  Location: [facility];  Service: Radiology;  Laterality: N/A;  . TUBAL LIGATION      There were no vitals filed for this visit.   Subjective Assessment - 04/15/20 1703    Subjective  Pt reports right shoulder pain and hand pain    Pertinent History ICH 01/06/20, Aneurysm. PMH: OA bilateral knees and hands    Currently in Pain? Yes    Pain Score 2     Pain Location Hand   shoulder   Pain Orientation Right    Pain Descriptors / Indicators Aching    Pain Type Chronic pain    Pain Onset More than a month ago    Pain Frequency Constant    Aggravating Factors  movement    Pain Relieving Factors rest                   Treatment: Pt demonstrates shoulder pain today. Hotpack applied to right shoulder with pillow under RUE.  Pt was cued to gently rock LE's from side to side for distraction/ body on arm movements.Therapist performed gentle joint mobs, then gently moved RUE out to the side.Sidelying on R shoulder pt performed A/ROM elbow flexion/ extension and supination/ pronation, min facilitation/ v.c  Fluidotherapy x 7 mins to  right hand for pain and stiffness followed by  Gentle P/ROM to digits in flexion and extension followed by AA/ROM finger flexion/ extension.  Pt reports moving hand better at end of session, she was able to grasp a piece of paper..            OT Short Term Goals - 04/13/20 1031      OT SHORT TERM GOAL #1   Title I with initial HEP.    Time --    Period Weeks    Status Achieved    Target Date 04/13/20      OT SHORT TERM GOAL #2   Title Pt will perform bathing with min A.    Time --    Period Weeks    Status Achieved   Pt reports showering with min A     OT SHORT TERM GOAL #3   Title Pt will donn shirt and pants with min A    Time 4    Period Weeks    Status On-going   mod A     OT SHORT TERM GOAL #4   Title Pt will demonstrate 30 A/ROM shoulder flexion for RUE with pain less than or  equal to 4/10.    Time 4    Period Weeks    Status On-going      OT SHORT TERM GOAL #5   Title Pt will  demonstrate improved LUE fine motor coordination for ADLs as evidenced by decreasing 9 hole peg test score to 45 secs or less.    Time --    Period Weeks    Status Achieved   42.34     OT SHORT TERM GOAL #6   Title Pt/ caregiver will verbalize understanding of RUE positioning and edema control techniques to minimize pain and risk for injury    Time 4    Period Weeks    Status On-going   pt uses a pillow, she has splint, needs reinforcement     OT SHORT TERM GOAL #7   Title Pt will demonstrate at least 25% finger flexion/ extension in RUE in prep for functional use.    Time 4    Period Weeks    Status On-going   grossly 20%, not consistent            OT Long Term Goals - 04/13/20 1122      OT LONG TERM GOAL #1   Title Pt will perfrom  all basic ADLS with supervision    Time 8    Period Weeks    Status On-going      OT LONG TERM GOAL #2   Title Pt will demonstrate 45* RUE shoulder flexion in prep for functional reach with pain less than or equal to 3/10.    Time 8    Period Weeks    Status On-going      OT LONG TERM GOAL #3   Title Pt will use RUE as a stabilizer/ gross A at least 30 % of the time for ADLs/ IADLs.    Period Weeks    Status On-going      OT LONG TERM GOAL #4   Title Pt will increase LUE grip strength to 25 lbs or greater for increased ease with ADLs.    Time 8    Period Weeks    Status On-going      OT LONG TERM GOAL #5   Title Pt will perform simple home management/ snack prep with min  A.    Time 8    Period Weeks    Status On-going      OT LONG TERM GOAL #6   Title Pt will demonstrate ability to grasp/ release a cup 2/3 trials with RUE.    Time 8    Period Weeks    Status On-going      OT LONG TERM GOAL #7   Title Pt will safely navigate a busy environment and locate items with supervision and  90% or greater accuracy without bumping  into items.    Time 8    Period Weeks    Status On-going      OT LONG TERM GOAL #8   Title I with updated HEP.    Time 8    Period Weeks    Status On-going                 Plan - 04/15/20 1704    Clinical Impression Statement Pt is progressing slowly towards goals due to shoulder and hand pain. Pt remains very fearful of movements.    OT Occupational Profile and History Detailed Assessment- Review of Records and additional review of physical, cognitive, psychosocial history related to current functional performance    Occupational performance deficits (Please refer to evaluation for details): ADL's;IADL's;Rest and Sleep;Work;Play;Leisure    Body Structure / Function / Physical Skills ADL;Balance;Coordination;Decreased knowledge of precautions;Decreased knowledge of use of DME;Dexterity;Edema;Mobility;Tone;Strength;IADL;Sensation;GMC;Gait;ROM;FMC;Flexibility;Pain;Vision;UE functional use;Endurance    Cognitive Skills Attention;Perception;Problem Solve;Safety Awareness;Thought    Rehab Potential Good    OT Frequency 3x / week    OT Duration 8 weeks    OT Treatment/Interventions Self-care/ADL training;Ultrasound;Energy conservation;Visual/perceptual remediation/compensation;Patient/family education;DME and/or AE instruction;Aquatic Therapy;Paraffin;Gait Training;Passive range of motion;Balance training;Fluidtherapy;Electrical Stimulation;Functional Mobility Training;Splinting;Moist Heat;Therapeutic exercise;Manual Therapy;Cognitive remediation/compensation;Manual lymph drainage;Neuromuscular education;Coping strategies training    Plan continue NMR, pain reduction    Consulted and Agree with Plan of Care Patient;Family member/caregiver           Patient will benefit from skilled therapeutic intervention in order to improve the following deficits and impairments:   Body Structure / Function / Physical Skills: ADL, Balance, Coordination, Decreased knowledge of precautions, Decreased  knowledge of use of DME, Dexterity, Edema, Mobility, Tone, Strength, IADL, Sensation, GMC, Gait, ROM, FMC, Flexibility, Pain, Vision, UE functional use, Endurance Cognitive Skills: Attention, Perception, Problem Solve, Safety Awareness, Thought     Visit Diagnosis: Pain in right hand  Frontal lobe and executive function deficit  Other lack of coordination  Attention and concentration deficit  Muscle weakness (generalized)  Acute pain of right shoulder    Problem List Patient Active Problem List   Diagnosis Date Noted  . Dyspareunia in female 04/05/2020  . History of abnormal cervical Pap smear 03/10/2020  . GIB (gastrointestinal bleeding) 02/28/2020  . Elevated BUN   . Prediabetes   . 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 Cerebral vasospasm   . Sinus tachycardia   . Dysphagia, post-stroke   . Thrombocytopenia ([place])   . Acute blood loss anemia   . Brain aneurysm   . ICH (intracerebral hemorrhage) ([place]) 01/05/2020  . History of adenomatous polyp of colon 02/07/2016  . 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 Fissure in ano 11/11/2014  . Hemorrhoids 11/11/2014  . Constipation 11/19/2013  . GERD (gastroesophageal reflux disease) 03/24/2013  . Irritable bowel syndrome with diarrhea 03/24/2013  . Herpes zoster 05/14/2005  . Abnormal Pap smear of cervix 05/15/1983  . History of cervical dysplasia 05/15/1983    Jonet Mathies 04/15/2020, 5:05  PM  [facility] [address] [place], [place], [address] Phone: [phone]   Fax:  [phone]  Name: Erika Stanton MRN: [id number] Date of Birth: [birth year]

## 2020-04-16 NOTE — Telephone Encounter (Signed)
Yes that is totally fine. - Dr. Amalia Hailey

## 2020-04-17 ENCOUNTER — Encounter: Payer: Self-pay | Admitting: Emergency Medicine

## 2020-04-17 ENCOUNTER — Emergency Department
Admission: EM | Admit: 2020-04-17 | Discharge: 2020-04-17 | Disposition: A | Discharge status: Home / Self Care | Payer: BC Managed Care – PPO | Attending: Emergency Medicine | Admitting: Emergency Medicine

## 2020-04-17 ENCOUNTER — Other Ambulatory Visit: Payer: Self-pay

## 2020-04-17 ENCOUNTER — Emergency Department: Payer: BC Managed Care – PPO

## 2020-04-17 DIAGNOSIS — Z79899 Other long term (current) drug therapy: Secondary | ICD-10-CM | POA: Diagnosis not present

## 2020-04-17 DIAGNOSIS — Z87891 Personal history of nicotine dependence: Secondary | ICD-10-CM | POA: Diagnosis not present

## 2020-04-17 DIAGNOSIS — R519 Headache, unspecified: Secondary | ICD-10-CM | POA: Insufficient documentation

## 2020-04-17 DIAGNOSIS — Z7982 Long term (current) use of aspirin: Secondary | ICD-10-CM | POA: Diagnosis not present

## 2020-04-17 DIAGNOSIS — Z7902 Long term (current) use of antithrombotics/antiplatelets: Secondary | ICD-10-CM | POA: Insufficient documentation

## 2020-04-17 HISTORY — DX: Aneurysm of unspecified site: I72.9

## 2020-04-17 LAB — BASIC METABOLIC PANEL
Anion gap: 10 (ref 5–15)
BUN: 11 mg/dL (ref 8–23)
CO2: 27 mmol/L (ref 22–32)
Calcium: 9.7 mg/dL (ref 8.9–10.3)
Chloride: 102 mmol/L (ref 98–111)
Creatinine, Ser: 0.52 mg/dL (ref 0.44–1.00)
GFR, Estimated: >60 mL/min (ref >60)
Glucose, Bld: 145 mg/dL — ABNORMAL HIGH (ref 70–99)
Potassium: 4 mmol/L (ref 3.5–5.1)
Sodium: 139 mmol/L (ref 135–145)

## 2020-04-17 LAB — CBC
HCT: 40.7 % (ref 36.0–46.0)
Hemoglobin: 13.1 g/dL (ref 12.0–15.0)
MCH: 29.4 pg (ref 26.0–34.0)
MCHC: 32.2 g/dL (ref 30.0–36.0)
MCV: 91.3 fL (ref 80.0–100.0)
Platelets: 338 10*3/uL (ref 150–400)
RBC: 4.46 MIL/uL (ref 3.87–5.11)
RDW: 12.4 % (ref 11.5–15.5)
WBC: 8.5 10*3/uL (ref 4.0–10.5)
nRBC: 0 % (ref 0.0–0.2)

## 2020-04-17 MED ORDER — IOHEXOL 350 MG/ML SOLN
75.0000 mL | Freq: Once | INTRAVENOUS | Status: AC | PRN
  Administered 2020-04-17: 75 mL via INTRAVENOUS

## 2020-04-17 MED ORDER — BUTALBITAL-APAP-CAFFEINE 50-325-40 MG PO TABS
1.0000 | ORAL_TABLET | Freq: Once | ORAL | Status: AC
  Administered 2020-04-17: 1 via ORAL
  Filled 2020-04-17: qty 1

## 2020-04-17 NOTE — Discharge Instructions (Addendum)
Please seek medical attention for any high fevers, chest pain, shortness of breath, change in behavior, persistent vomiting, bloody stool or any other new or concerning symptoms.  

## 2020-04-17 NOTE — ED Provider Notes (Signed)
[facility] Emergency Department Provider Note   ____________________________________________   I have reviewed the triage vital signs and the nursing notes.   HISTORY  Chief Complaint Headache   History limited by: Not Limited   HPI Erika Stanton is a 64 y.o. female who presents to the emergency department today because of concerns for headache.  She states that it started early this afternoon.  Located behind her eyes.  She did try taking some Tylenol without any significant relief.  She denies any vision change.  Denies any nausea or vomiting.  Denies any recent trauma to her head.  She did have concerns given history of aneurysm requiring coiling and another aneurysm that is currently being watched.   Records reviewed. Per medical record review patient has a history of cerebral aneurysm rupture.   Past Medical History:  Diagnosis Date  . Aneurysm ([place])   . Melena     Patient Active Problem List   Diagnosis Date Noted  . Dyspareunia in female 04/05/2020  . History of abnormal cervical Pap smear 03/10/2020  . GIB (gastrointestinal bleeding) 02/28/2020  . Elevated BUN   . Prediabetes   . 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 Cerebral vasospasm   . Sinus tachycardia   . Dysphagia, post-stroke   . Thrombocytopenia ([place])   . Acute blood loss anemia   . Brain aneurysm   . ICH (intracerebral hemorrhage) ([place]) 01/05/2020  . History of adenomatous polyp of colon 02/07/2016  . 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 Fissure in ano 11/11/2014  . Hemorrhoids 11/11/2014  . Constipation 11/19/2013  . GERD (gastroesophageal reflux disease) 03/24/2013  . Irritable bowel syndrome with diarrhea 03/24/2013  . Herpes zoster 05/14/2005  . Abnormal Pap smear of cervix 05/15/1983  . History of cervical dysplasia 05/15/1983    Past Surgical History:  Procedure Laterality Date  . CHOLECYSTECTOMY    . COLONOSCOPY  11/2017   at [facility]. no  recurrent polyps.  suggest repeat surveillance study 11/2022.    Marland Kitchen COLONOSCOPY W/ POLYPECTOMY  06/2014   Dr Roxy Manns at [facility].  3 adenomatoous polyps, anal fissure.    . ESOPHAGOGASTRODUODENOSCOPY (EGD) WITH PROPOFOL N/A 01/27/2020   Procedure: ESOPHAGOGASTRODUODENOSCOPY (EGD) WITH PROPOFOL;  Surgeon: Mauri Pole, MD;  Location: [facility];  Service: Endoscopy;  Laterality: N/A;  . IR 3D INDEPENDENT WKST  01/06/2020  . IR ANGIO INTRA EXTRACRAN SEL INTERNAL CAROTID BILAT MOD SED  01/06/2020  . IR ANGIO VERTEBRAL SEL VERTEBRAL UNI L MOD SED  01/06/2020  . IR ANGIOGRAM FOLLOW UP STUDY  01/06/2020  . IR ANGIOGRAM FOLLOW UP STUDY  01/06/2020  . IR ANGIOGRAM FOLLOW UP STUDY  01/06/2020  . IR ANGIOGRAM FOLLOW UP STUDY  01/06/2020  . IR ANGIOGRAM FOLLOW UP STUDY  01/06/2020  . IR ANGIOGRAM FOLLOW UP STUDY  01/06/2020  . IR ANGIOGRAM FOLLOW UP STUDY  01/06/2020  . IR ANGIOGRAM FOLLOW UP STUDY  01/06/2020  . IR ANGIOGRAM FOLLOW UP STUDY  01/06/2020  . IR ANGIOGRAM FOLLOW UP STUDY  01/06/2020  . IR NEURO EACH ADD'L AFTER BASIC UNI RIGHT (MS)  01/06/2020  . IR TRANSCATH/EMBOLIZ  01/06/2020  . RADIOLOGY WITH ANESTHESIA N/A 01/06/2020   Procedure: IR WITH ANESTHESIA FOR ANEURYSM;  Surgeon: Consuella Lose, MD;  Location: [facility];  Service: Radiology;  Laterality: N/A;  . TUBAL LIGATION      Prior to Admission medications   Medication Sig Start Date End Date Taking? Authorizing Provider  acetaminophen (TYLENOL)  325 MG tablet Take 1-2 tablets (325-650 mg total) by mouth every 4 (four) hours as needed for mild pain. 02/22/20   Angiulli, Lavon Paganini, PA-C  aspirin 325 MG tablet Place 1 tablet (325 mg total) into feeding tube daily. 03/22/20   Raulkar, Clide Deutscher, MD  clopidogrel (PLAVIX) 75 MG tablet Place 1 tablet (75 mg total) into feeding tube daily. 03/22/20   Raulkar, Clide Deutscher, MD  gabapentin (NEURONTIN) 300 MG capsule Take 1 capsule (300 mg total) by mouth 3 (three) times daily. 03/22/20   Raulkar, Clide Deutscher, MD   pantoprazole (PROTONIX) 40 MG tablet Take by mouth.    [provider]    Allergies Sulfa antibiotics  Family History  Problem Relation Age of Onset  . Cancer Mother   . Congestive Heart Failure Mother   . Lung cancer Father   . Lung cancer Sister     Social History Social History   Tobacco Use  . Smoking status: Former Research scientist (life sciences)  . Smokeless tobacco: Never Used  Substance Use Topics  . Alcohol use: Not on file  . Drug use: Not on file    Review of Systems Constitutional: No fever/chills Eyes: No visual changes. ENT: No sore throat. Cardiovascular: Denies chest pain. Respiratory: Denies shortness of breath. Gastrointestinal: No abdominal pain.  No nausea, no vomiting.  No diarrhea.   Genitourinary: Negative for dysuria. Musculoskeletal: Negative for back pain. Skin: Negative for rash. Neurological: Positive for headache. Chronic right sided weakness from previous aneurysm.  ____________________________________________   PHYSICAL EXAM:  VITAL SIGNS: ED Triage Vitals  Enc Vitals Group     BP 04/17/20 2003 (!) 141/79     Pulse Rate 04/17/20 2003 78     Resp 04/17/20 2003 18     Temp 04/17/20 2003 98.1 F (36.7 C)     Temp Source 04/17/20 2003 Oral     SpO2 04/17/20 2003 97 %     Weight 04/17/20 2010 178 lb (80.7 kg)     Height 04/17/20 2010 5\' 7"  (1.702 m)     Head Circumference --      Peak Flow --      Pain Score 04/17/20 2009 5   Constitutional: Alert and oriented.  Eyes: Conjunctivae are normal.  ENT      Head: Normocephalic and atraumatic.      Nose: No congestion/rhinnorhea.      Mouth/Throat: Mucous membranes are moist.      Neck: No stridor. Hematological/Lymphatic/Immunilogical: No cervical lymphadenopathy. Cardiovascular: Normal rate, regular rhythm.  No murmurs, rubs, or gallops.  Respiratory: Normal respiratory effort without tachypnea nor retractions. Breath sounds are clear and equal bilaterally. No  wheezes/rales/rhonchi. Gastrointestinal: Soft. Genitourinary: Deferred Musculoskeletal: Normal range of motion in all extremities.  Neurologic:  Normal speech and language.  Skin:  Skin is warm, dry and intact. No rash noted. Psychiatric: Mood and affect are normal. Speech and behavior are normal. Patient exhibits appropriate insight and judgment.  ____________________________________________    LABS (pertinent positives/negatives)  CBC wbc 8.5, hgb 13.1, plt 338 BMP wnl except glu 145  ____________________________________________   EKG  None  ____________________________________________    RADIOLOGY  CTA HEAD AND NECK IMPRESSION:    1. 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 No obvious residual flow or evidence  for recanalization within the aneurysm sac itself. Grossly patent  flow through the adjacent right MCA stent with perfusion of the  right MCA branches distally.  2. 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  3. 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  4. Otherwise stable and negative CTA of the head and neck. No large  vessel occlusion, hemodynamically significant stenosis, or other  acute vascular abnormality.     ____________________________________________   PROCEDURES  Procedures  ____________________________________________   INITIAL IMPRESSION / ASSESSMENT AND PLAN / ED COURSE  Pertinent labs & imaging results that were available during my care of the patient were reviewed by me and considered in my medical decision making (see chart for details).   Patient presented to the emergency department today because of concerns for headache and neck concern given her history of aneurysm.  She also states she has glaucoma.  Patient CT angio here without any concerning findings.  Does show stable known aneurysmal disease. At this time doubt glaucoma given  lack of vision changes. Patient was given Fioricet and did feel good relief of her headache. Discussed findings with patient. Will plan on discharging home.  ____________________________________________   FINAL CLINICAL IMPRESSION(S) / ED DIAGNOSES  Final diagnoses:  Nonintractable headache, unspecified chronicity pattern, unspecified headache type     Note: This dictation was prepared with Dragon dictation. Any transcriptional errors that result from this process are unintentional     Nance Pear, MD 04/17/20 2248

## 2020-04-17 NOTE — ED Triage Notes (Addendum)
Pt arrived via POV with reports of HA behind the eyes, pt reports she had aneurysm in August 2021 with surgical intervention and was admitted to [facility].  Pt states it feels like a normal headache, but states she did take tylenol and did not ease her pain  Pt states she has hx of narrow angle glaucoma.  Pt is alert and oriented at this time.

## 2020-04-18 ENCOUNTER — Ambulatory Visit: Payer: BC Managed Care – PPO | Admitting: Rehabilitation

## 2020-04-18 ENCOUNTER — Ambulatory Visit: Payer: BC Managed Care – PPO

## 2020-04-18 ENCOUNTER — Ambulatory Visit: Payer: BC Managed Care – PPO | Admitting: Occupational Therapy

## 2020-04-19 ENCOUNTER — Other Ambulatory Visit: Payer: Self-pay

## 2020-04-19 NOTE — Telephone Encounter (Signed)
Could you call the pt and update her please. Thanks

## 2020-04-20 ENCOUNTER — Other Ambulatory Visit: Payer: Self-pay

## 2020-04-20 ENCOUNTER — Ambulatory Visit: Payer: BC Managed Care – PPO | Admitting: Occupational Therapy

## 2020-04-20 ENCOUNTER — Ambulatory Visit: Payer: BC Managed Care – PPO

## 2020-04-20 DIAGNOSIS — R2689 Other abnormalities of gait and mobility: Secondary | ICD-10-CM | POA: Diagnosis not present

## 2020-04-20 DIAGNOSIS — R41841 Cognitive communication deficit: Secondary | ICD-10-CM

## 2020-04-20 DIAGNOSIS — M25511 Pain in right shoulder: Secondary | ICD-10-CM

## 2020-04-20 DIAGNOSIS — M79641 Pain in right hand: Secondary | ICD-10-CM

## 2020-04-20 MED ORDER — GABAPENTIN 300 MG PO CAPS
300.0000 mg | ORAL_CAPSULE | Freq: Three times a day (TID) | ORAL | 0 refills | Status: DC
Start: 2020-04-20 — End: 2020-05-18

## 2020-04-20 NOTE — Therapy (Signed)
[facility] [address] [place], [place], [address] Phone: [phone]   Fax:  [phone]  Speech Language Pathology Treatment  Patient Details  Name: Erika Stanton MRN: [id number] Date of Birth: Jul 17, 1955 Referring Provider (SLP): Alger Simons, MD   Encounter Date: 04/20/2020   End of Session - 04/20/20 1709    Visit Number 13    Number of Visits 17    Date for SLP Re-Evaluation 05/27/20    SLP Start Time 1021    SLP Stop Time  1101    SLP Time Calculation (min) 40 min           Past Medical History:  Diagnosis Date  . Aneurysm ([place])   . Melena     Past Surgical History:  Procedure Laterality Date  . CHOLECYSTECTOMY    . COLONOSCOPY  11/2017   at [facility]. no recurrent polyps.  suggest repeat surveillance study 11/2022.    Marland Kitchen COLONOSCOPY W/ POLYPECTOMY  06/2014   Dr Roxy Manns at [facility].  3 adenomatoous polyps, anal fissure.    . ESOPHAGOGASTRODUODENOSCOPY (EGD) WITH PROPOFOL N/A 01/27/2020   Procedure: ESOPHAGOGASTRODUODENOSCOPY (EGD) WITH PROPOFOL;  Surgeon: Mauri Pole, MD;  Location: [facility];  Service: Endoscopy;  Laterality: N/A;  . IR 3D INDEPENDENT WKST  01/06/2020  . IR ANGIO INTRA EXTRACRAN SEL INTERNAL CAROTID BILAT MOD SED  01/06/2020  . IR ANGIO VERTEBRAL SEL VERTEBRAL UNI L MOD SED  01/06/2020  . IR ANGIOGRAM FOLLOW UP STUDY  01/06/2020  . IR ANGIOGRAM FOLLOW UP STUDY  01/06/2020  . IR ANGIOGRAM FOLLOW UP STUDY  01/06/2020  . IR ANGIOGRAM FOLLOW UP STUDY  01/06/2020  . IR ANGIOGRAM FOLLOW UP STUDY  01/06/2020  . IR ANGIOGRAM FOLLOW UP STUDY  01/06/2020  . IR ANGIOGRAM FOLLOW UP STUDY  01/06/2020  . IR ANGIOGRAM FOLLOW UP STUDY  01/06/2020  . IR ANGIOGRAM FOLLOW UP STUDY  01/06/2020  . IR ANGIOGRAM FOLLOW UP STUDY  01/06/2020  . IR NEURO EACH ADD'L AFTER BASIC UNI RIGHT (MS)  01/06/2020  . IR TRANSCATH/EMBOLIZ  01/06/2020  . RADIOLOGY WITH ANESTHESIA N/A 01/06/2020   Procedure: IR WITH  ANESTHESIA FOR ANEURYSM;  Surgeon: Consuella Lose, MD;  Location: [facility];  Service: Radiology;  Laterality: N/A;  . TUBAL LIGATION      There were no vitals filed for this visit.          ADULT SLP TREATMENT - 04/20/20 1700      General Information   Behavior/Cognition Alert;Cooperative;Impulsive;Pleasant mood      Treatment Provided   Treatment provided Cognitive-Linquistic      Cognitive-Linquistic Treatment   Treatment focused on Cognition    Skilled Treatment Nadiya told SLP that her progress on Talk Path required some changing of tasks. SLP obaserved her status and language tasks were very good, sequencing good. Pt with most difficulty with tasks requiring slower more careful observation and more precise mouse movement. Pt still demonstrates impulsivity which in turn made performance on these tasks decline. Her impulsivity does not appear as strong/persistent as in previous session with this SLP. Zamariya noted this but did little to improve the behavior. When SLP provided pt another alternative for mouse usage she used intermittently/rarely.       Assessment / Recommendations / Plan   Plan Continue with current plan of care      Progression Toward Goals   Progression toward goals Progressing toward goals  SLP Short Term Goals - 04/13/20 1137      SLP SHORT TERM GOAL #1   Title pt to complete cognitive linguistic testing    Period --   or 4 total sessions   Status Achieved      SLP SHORT TERM GOAL #2   Title pt will demo emergent awareness 100% with min complex therapy tasks x3 visits    Baseline 03-30-20;    Status Partially Met      SLP SHORT TERM GOAL #3   Title pt will demo appriopriate aspiration precuations with suggested POs x3 sessions    Status Deferred      SLP SHORT TERM GOAL #4   Title pt will solve household probelms using functional solutions x3 sessions    Baseline 03-30-20; 04-11-20    Status Achieved      SLP SHORT TERM GOAL  #5   Title pt will demo selective attention (internal distraction) to a simple functional task for 10 minutes with rare min A back to task in 3 sessions    Baseline 03-28-20    Period Weeks    Status Partially Met            SLP Long Term Goals - 04/20/20 1711      SLP LONG TERM GOAL #1   Title pt will demo anticipatory awareness by double checking all work with a non-verbal cue x3 sessions    Time 3    Period Weeks   or 17 total sessions, for all LTGs   Status Revised      SLP LONG TERM GOAL #2   Title pt will follow swallow precautions with POs with modified independence x3 sessions    Status Deferred      SLP LONG TERM GOAL #3   Title pt will demo [facility] skills in solving min-mod complex problems in WNL amount of time with modified independence (double checking answers , etc)    Time 4    Period Weeks    Status Revised      SLP LONG TERM GOAL #4   Title pt will demo simple alternating attention in functional cognitive linguistic tasks in 3 sessions    Time 3    Period Weeks    Status On-going            Plan - 04/20/20 1709    Clinical Impression Statement Milly's impulsivity appears decr'd, compared to last session with this SLP ~3 weeks ago. Attention (selective) also appears to be improving. However, her awareness and simple executive function (problem solving) deficits appear to remain unchanged from ~3 weeks ago. 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 She is leaning towards early retirement.    Speech Therapy Frequency 2x / week    Duration 8 weeks   17 visits   Treatment/Interventions Aspiration precaution training;Pharyngeal strengthening exercises;Compensatory techniques;Diet toleration management by SLP;Trials of upgraded  texture/liquids;Cueing hierarchy;Cognitive reorganization;Internal/external aids;Patient/family education;SLP instruction and feedback;Functional tasks    Potential to Achieve Goals Good           Patient will benefit from skilled therapeutic intervention in order to improve the following deficits and impairments:   Cognitive communication deficit    Problem List Patient Active Problem List   Diagnosis Date Noted  .  Dyspareunia in female 04/05/2020  . History of abnormal cervical Pap smear 03/10/2020  . GIB (gastrointestinal bleeding) 02/28/2020  . Elevated BUN   . Prediabetes   . 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 Cerebral vasospasm   . Sinus tachycardia   . Dysphagia, post-stroke   . Thrombocytopenia ([place])   . Acute blood loss anemia   . Brain aneurysm   . ICH (intracerebral hemorrhage) ([place]) 01/05/2020  . History of adenomatous polyp of colon 02/07/2016  . 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 Fissure in ano 11/11/2014  . Hemorrhoids 11/11/2014  . Constipation 11/19/2013  . GERD (gastroesophageal reflux disease) 03/24/2013  . Irritable bowel syndrome with diarrhea 03/24/2013  . Herpes zoster 05/14/2005  . Abnormal Pap smear of cervix 05/15/1983  . History of cervical dysplasia 05/15/1983    [facility] ,MS, CCC-SLP  04/20/2020, 5:13 PM  [facility] [address] [place], [place], [address] Phone: [phone]   Fax:  [phone]   Name: Erika Stanton MRN: [id number] Date of Birth: [birth year]

## 2020-04-20 NOTE — Patient Instructions (Signed)
  Please complete the assigned speech therapy homework prior to your next session and return it to the speech therapist at your next visit.  

## 2020-04-20 NOTE — Therapy (Signed)
[facility] [address] [place], [place], [address] Phone: [phone]   Fax:  [phone]  Occupational Therapy Treatment  Patient Details  Name: Erika Stanton MRN: [id number] Date of Birth: 09/21/1955 Referring Provider (OT): Dr. Naaman Plummer   Encounter Date: 04/20/2020   OT End of Session - 04/20/20 1422    Visit Number 14    Number of Visits 26    Date for OT Re-Evaluation 06/08/20    Authorization Time Period State [employer]    OT Start Time 0930    OT Stop Time 1015    OT Time Calculation (min) 45 min    Activity Tolerance Patient tolerated treatment well    Behavior During Therapy [facility] for tasks assessed/performed           Past Medical History:  Diagnosis Date  . Aneurysm ([place])   . Melena     Past Surgical History:  Procedure Laterality Date  . CHOLECYSTECTOMY    . COLONOSCOPY  11/2017   at [facility]. no recurrent polyps.  suggest repeat surveillance study 11/2022.    Marland Kitchen COLONOSCOPY W/ POLYPECTOMY  06/2014   Dr Roxy Manns at [facility].  3 adenomatoous polyps, anal fissure.    . ESOPHAGOGASTRODUODENOSCOPY (EGD) WITH PROPOFOL N/A 01/27/2020   Procedure: ESOPHAGOGASTRODUODENOSCOPY (EGD) WITH PROPOFOL;  Surgeon: Mauri Pole, MD;  Location: [facility];  Service: Endoscopy;  Laterality: N/A;  . IR 3D INDEPENDENT WKST  01/06/2020  . IR ANGIO INTRA EXTRACRAN SEL INTERNAL CAROTID BILAT MOD SED  01/06/2020  . IR ANGIO VERTEBRAL SEL VERTEBRAL UNI L MOD SED  01/06/2020  . IR ANGIOGRAM FOLLOW UP STUDY  01/06/2020  . IR ANGIOGRAM FOLLOW UP STUDY  01/06/2020  . IR ANGIOGRAM FOLLOW UP STUDY  01/06/2020  . IR ANGIOGRAM FOLLOW UP STUDY  01/06/2020  . IR ANGIOGRAM FOLLOW UP STUDY  01/06/2020  . IR ANGIOGRAM FOLLOW UP STUDY  01/06/2020  . IR ANGIOGRAM FOLLOW UP STUDY  01/06/2020  . IR ANGIOGRAM FOLLOW UP STUDY  01/06/2020  . IR ANGIOGRAM FOLLOW UP STUDY  01/06/2020  . IR ANGIOGRAM FOLLOW UP STUDY  01/06/2020  . IR NEURO EACH ADD'L AFTER  BASIC UNI RIGHT (MS)  01/06/2020  . IR TRANSCATH/EMBOLIZ  01/06/2020  . RADIOLOGY WITH ANESTHESIA N/A 01/06/2020   Procedure: IR WITH ANESTHESIA FOR ANEURYSM;  Surgeon: Consuella Lose, MD;  Location: [facility];  Service: Radiology;  Laterality: N/A;  . TUBAL LIGATION      There were no vitals filed for this visit.   Subjective Assessment - 04/20/20 0941    Subjective  Pt reports right shoulder pain and hand pain    Pertinent History ICH 01/06/20, Aneurysm. PMH: OA bilateral knees and hands    Currently in Pain? Yes    Pain Score 2    pain higher with movement   Pain Location Shoulder    Pain Orientation Right    Pain Descriptors / Indicators Aching    Pain Type Acute pain    Pain Onset More than a month ago    Pain Frequency Intermittent    Aggravating Factors  movement    Pain Relieving Factors rest           Seated: body on arm movements with bilateral trunk flexion during UE wt bearing. Followed by body on arm movements over RUE with cross reaching LUE followed by trunk rotation to Lt side. Self passive stretch in low range sh flexion to floor, followed by AA/ROM BUE's to  floor and then midranges using pool noodle along transfer board.  AA/ROM with tilted stool w/ pain - pt able to control pain if distracted and not focused on pain.                        OT Short Term Goals - 04/13/20 1031      OT SHORT TERM GOAL #1   Title I with initial HEP.    Time --    Period Weeks    Status Achieved    Target Date 04/13/20      OT SHORT TERM GOAL #2   Title Pt will perform bathing with min A.    Time --    Period Weeks    Status Achieved   Pt reports showering with min A     OT SHORT TERM GOAL #3   Title Pt will donn shirt and pants with min A    Time 4    Period Weeks    Status On-going   mod A     OT SHORT TERM GOAL #4   Title Pt will demonstrate 30 A/ROM shoulder flexion for RUE with pain less than or equal to 4/10.    Time 4    Period Weeks     Status On-going      OT SHORT TERM GOAL #5   Title Pt will  demonstrate improved LUE fine motor coordination for ADLs as evidenced by decreasing 9 hole peg test score to 45 secs or less.    Time --    Period Weeks    Status Achieved   42.34     OT SHORT TERM GOAL #6   Title Pt/ caregiver will verbalize understanding of RUE positioning and edema control techniques to minimize pain and risk for injury    Time 4    Period Weeks    Status On-going   pt uses a pillow, she has splint, needs reinforcement     OT SHORT TERM GOAL #7   Title Pt will demonstrate at least 25% finger flexion/ extension in RUE in prep for functional use.    Time 4    Period Weeks    Status On-going   grossly 20%, not consistent            OT Long Term Goals - 04/13/20 1122      OT LONG TERM GOAL #1   Title Pt will perfrom  all basic ADLS with supervision    Time 8    Period Weeks    Status On-going      OT LONG TERM GOAL #2   Title Pt will demonstrate 45* RUE shoulder flexion in prep for functional reach with pain less than or equal to 3/10.    Time 8    Period Weeks    Status On-going      OT LONG TERM GOAL #3   Title Pt will use RUE as a stabilizer/ gross A at least 30 % of the time for ADLs/ IADLs.    Period Weeks    Status On-going      OT LONG TERM GOAL #4   Title Pt will increase LUE grip strength to 25 lbs or greater for increased ease with ADLs.    Time 8    Period Weeks    Status On-going      OT LONG TERM GOAL #5   Title Pt will perform simple home management/ snack prep with min  A.    Time 8    Period Weeks    Status On-going      OT LONG TERM GOAL #6   Title Pt will demonstrate ability to grasp/ release a cup 2/3 trials with RUE.    Time 8    Period Weeks    Status On-going      OT LONG TERM GOAL #7   Title Pt will safely navigate a busy environment and locate items with supervision and  90% or greater accuracy without bumping into items.    Time 8    Period Weeks     Status On-going      OT LONG TERM GOAL #8   Title I with updated HEP.    Time 8    Period Weeks    Status On-going                 Plan - 04/20/20 1425    Clinical Impression Statement Pt is progressing slowly towards goals due to shoulder and hand pain. Pt remains very fearful of movements.    OT Occupational Profile and History Detailed Assessment- Review of Records and additional review of physical, cognitive, psychosocial history related to current functional performance    Occupational performance deficits (Please refer to evaluation for details): ADL's;IADL's;Rest and Sleep;Work;Play;Leisure    Body Structure / Function / Physical Skills ADL;Balance;Coordination;Decreased knowledge of precautions;Decreased knowledge of use of DME;Dexterity;Edema;Mobility;Tone;Strength;IADL;Sensation;GMC;Gait;ROM;FMC;Flexibility;Pain;Vision;UE functional use;Endurance    Cognitive Skills Attention;Perception;Problem Solve;Safety Awareness;Thought    Rehab Potential Good    OT Frequency 3x / week    OT Duration 8 weeks    OT Treatment/Interventions Self-care/ADL training;Ultrasound;Energy conservation;Visual/perceptual remediation/compensation;Patient/family education;DME and/or AE instruction;Aquatic Therapy;Paraffin;Gait Training;Passive range of motion;Balance training;Fluidtherapy;Electrical Stimulation;Functional Mobility Training;Splinting;Moist Heat;Therapeutic exercise;Manual Therapy;Cognitive remediation/compensation;Manual lymph drainage;Neuromuscular education;Coping strategies training    Plan Continue NMR, estim to Rt hand for extension    Consulted and Agree with Plan of Care Patient;Family member/caregiver           Patient will benefit from skilled therapeutic intervention in order to improve the following deficits and impairments:   Body Structure / Function / Physical Skills: ADL, Balance, Coordination, Decreased knowledge of precautions, Decreased knowledge of use of DME,  Dexterity, Edema, Mobility, Tone, Strength, IADL, Sensation, GMC, Gait, ROM, FMC, Flexibility, Pain, Vision, UE functional use, Endurance Cognitive Skills: Attention, Perception, Problem Solve, Safety Awareness, Thought     Visit Diagnosis: Acute pain of right shoulder  Pain in right hand    Problem List Patient Active Problem List   Diagnosis Date Noted  . Dyspareunia in female 04/05/2020  . History of abnormal cervical Pap smear 03/10/2020  . GIB (gastrointestinal bleeding) 02/28/2020  . Elevated BUN   . Prediabetes   . 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 Cerebral vasospasm   . Sinus tachycardia   . Dysphagia, post-stroke   . Thrombocytopenia ([place])   . Acute blood loss anemia   . Brain aneurysm   . ICH (intracerebral hemorrhage) ([place]) 01/05/2020  . History of adenomatous polyp of colon 02/07/2016  . 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 Fissure in ano 11/11/2014  . Hemorrhoids 11/11/2014  . Constipation 11/19/2013  . GERD (gastroesophageal reflux disease) 03/24/2013  . Irritable bowel syndrome with diarrhea 03/24/2013  . Herpes zoster 05/14/2005  . Abnormal Pap smear of cervix 05/15/1983  . History of cervical dysplasia 05/15/1983    Carey Bullocks, OTR/L 04/20/2020, 2:26 PM  [facility] [address] [place],  [place], [address] Phone: [phone]   Fax:  [phone]  Name: Erika Stanton MRN: [id number] Date of Birth: [birth year]

## 2020-04-22 ENCOUNTER — Ambulatory Visit: Payer: BC Managed Care – PPO | Admitting: Occupational Therapy

## 2020-04-22 ENCOUNTER — Ambulatory Visit: Payer: BC Managed Care – PPO

## 2020-04-22 ENCOUNTER — Encounter: Payer: Self-pay | Admitting: Occupational Therapy

## 2020-04-22 ENCOUNTER — Other Ambulatory Visit: Payer: Self-pay

## 2020-04-22 DIAGNOSIS — R41840 Attention and concentration deficit: Secondary | ICD-10-CM

## 2020-04-22 DIAGNOSIS — M6281 Muscle weakness (generalized): Secondary | ICD-10-CM

## 2020-04-22 DIAGNOSIS — M25511 Pain in right shoulder: Secondary | ICD-10-CM

## 2020-04-22 DIAGNOSIS — M79641 Pain in right hand: Secondary | ICD-10-CM

## 2020-04-22 DIAGNOSIS — R278 Other lack of coordination: Secondary | ICD-10-CM

## 2020-04-22 DIAGNOSIS — R41844 Frontal lobe and executive function deficit: Secondary | ICD-10-CM

## 2020-04-22 DIAGNOSIS — R2689 Other abnormalities of gait and mobility: Secondary | ICD-10-CM | POA: Diagnosis not present

## 2020-04-22 DIAGNOSIS — I69159 Hemiplegia and hemiparesis following nontraumatic intracerebral hemorrhage affecting unspecified side: Secondary | ICD-10-CM

## 2020-04-22 DIAGNOSIS — R2681 Unsteadiness on feet: Secondary | ICD-10-CM

## 2020-04-22 NOTE — Therapy (Signed)
[facility] [address] [place], [place], [address] Phone: [phone]   Fax:  [phone]  Occupational Therapy Treatment  Patient Details  Name: Erika Stanton MRN: [id number] Date of Birth: 06/11/1955 Referring Provider (OT): Dr. Naaman Plummer   Encounter Date: 04/22/2020   OT End of Session - 04/22/20 1126    Visit Number 15    Number of Visits 26    Date for OT Re-Evaluation 06/08/20    Authorization Time [place]    OT Start Time 1102    OT Stop Time 1145    OT Time Calculation (min) 43 min    Activity Tolerance Patient tolerated treatment well    Behavior During Therapy [facility] for tasks assessed/performed           Past Medical History:  Diagnosis Date  . Aneurysm ([place])   . Melena     Past Surgical History:  Procedure Laterality Date  . CHOLECYSTECTOMY    . COLONOSCOPY  11/2017   at [facility]. no recurrent polyps.  suggest repeat surveillance study 11/2022.    Marland Kitchen COLONOSCOPY W/ POLYPECTOMY  06/2014   Dr Roxy Manns at [facility].  3 adenomatoous polyps, anal fissure.    . ESOPHAGOGASTRODUODENOSCOPY (EGD) WITH PROPOFOL N/A 01/27/2020   Procedure: ESOPHAGOGASTRODUODENOSCOPY (EGD) WITH PROPOFOL;  Surgeon: Mauri Pole, MD;  Location: [place] [facility];  Service: Endoscopy;  Laterality: N/A;  . IR 3D INDEPENDENT WKST  01/06/2020  . IR ANGIO INTRA EXTRACRAN SEL INTERNAL CAROTID BILAT MOD SED  01/06/2020  . IR ANGIO VERTEBRAL SEL VERTEBRAL UNI L MOD SED  01/06/2020  . IR ANGIOGRAM FOLLOW UP STUDY  01/06/2020  . IR ANGIOGRAM FOLLOW UP STUDY  01/06/2020  . IR ANGIOGRAM FOLLOW UP STUDY  01/06/2020  . IR ANGIOGRAM FOLLOW UP STUDY  01/06/2020  . IR ANGIOGRAM FOLLOW UP STUDY  01/06/2020  . IR ANGIOGRAM FOLLOW UP STUDY  01/06/2020  . IR ANGIOGRAM FOLLOW UP STUDY  01/06/2020  . IR ANGIOGRAM FOLLOW UP STUDY  01/06/2020  . IR ANGIOGRAM FOLLOW UP STUDY  01/06/2020  . IR ANGIOGRAM FOLLOW UP STUDY  01/06/2020  . IR NEURO EACH ADD'L AFTER  BASIC UNI RIGHT (MS)  01/06/2020  . IR TRANSCATH/EMBOLIZ  01/06/2020  . RADIOLOGY WITH ANESTHESIA N/A 01/06/2020   Procedure: IR WITH ANESTHESIA FOR ANEURYSM;  Surgeon: Consuella Lose, MD;  Location: [facility];  Service: Radiology;  Laterality: N/A;  . TUBAL LIGATION      There were no vitals filed for this visit.   Subjective Assessment - 04/22/20 1401    Subjective  Pt reports right shoulder pain and hand pain    Pertinent History ICH 01/06/20, Aneurysm. PMH: OA bilateral knees and hands    Currently in Pain? Yes    Pain Score 2     Pain Location Shoulder   up to 5/10 with movement, shoulder and hand   Pain Orientation Right    Pain Descriptors / Indicators Aching    Pain Type Chronic pain    Pain Onset More than a month ago    Pain Frequency Intermittent    Aggravating Factors  movement    Pain Relieving Factors rest                 Treatment: Hotpack to right shoulder x 15 mins  and left hand x 10 mins due to pain , no adverse reactions. Gentle P/ROM to digits of right hand for flexion and extension. Standing to perform low  range shoulder flexion and horizontal abduction with bilateral UE's on towel roll. Seated self P/ROM to reach for the floor for shoulder flexion , then circumduction, min v.c NME 50 pps, 250 pw, 10 secs cycle to wrist and finger extensors, intensity 40, x 10 mins min facilitation with pt assisting with finger extension during on cycle, no adverse reactions. Standing low range functional grasp / release of cylindrical pegs, mod facilitation/ v.c                  OT Short Term Goals - 04/13/20 1031      OT SHORT TERM GOAL #1   Title I with initial HEP.    Time --    Period Weeks    Status Achieved    Target Date 04/13/20      OT SHORT TERM GOAL #2   Title Pt will perform bathing with min A.    Time --    Period Weeks    Status Achieved   Pt reports showering with min A     OT SHORT TERM GOAL #3   Title Pt will donn shirt and  pants with min A    Time 4    Period Weeks    Status On-going   mod A     OT SHORT TERM GOAL #4   Title Pt will demonstrate 30 A/ROM shoulder flexion for RUE with pain less than or equal to 4/10.    Time 4    Period Weeks    Status On-going      OT SHORT TERM GOAL #5   Title Pt will  demonstrate improved LUE fine motor coordination for ADLs as evidenced by decreasing 9 hole peg test score to 45 secs or less.    Time --    Period Weeks    Status Achieved   42.34     OT SHORT TERM GOAL #6   Title Pt/ caregiver will verbalize understanding of RUE positioning and edema control techniques to minimize pain and risk for injury    Time 4    Period Weeks    Status On-going   pt uses a pillow, she has splint, needs reinforcement     OT SHORT TERM GOAL #7   Title Pt will demonstrate at least 25% finger flexion/ extension in RUE in prep for functional use.    Time 4    Period Weeks    Status On-going   grossly 20%, not consistent            OT Long Term Goals - 04/13/20 1122      OT LONG TERM GOAL #1   Title Pt will perfrom  all basic ADLS with supervision    Time 8    Period Weeks    Status On-going      OT LONG TERM GOAL #2   Title Pt will demonstrate 45* RUE shoulder flexion in prep for functional reach with pain less than or equal to 3/10.    Time 8    Period Weeks    Status On-going      OT LONG TERM GOAL #3   Title Pt will use RUE as a stabilizer/ gross A at least 30 % of the time for ADLs/ IADLs.    Period Weeks    Status On-going      OT LONG TERM GOAL #4   Title Pt will increase LUE grip strength to 25 lbs or greater for increased ease with ADLs.  Time 8    Period Weeks    Status On-going      OT LONG TERM GOAL #5   Title Pt will perform simple home management/ snack prep with min A.    Time 8    Period Weeks    Status On-going      OT LONG TERM GOAL #6   Title Pt will demonstrate ability to grasp/ release a cup 2/3 trials with RUE.    Time 8     Period Weeks    Status On-going      OT LONG TERM GOAL #7   Title Pt will safely navigate a busy environment and locate items with supervision and  90% or greater accuracy without bumping into items.    Time 8    Period Weeks    Status On-going      OT LONG TERM GOAL #8   Title I with updated HEP.    Time 8    Period Weeks    Status On-going                  Patient will benefit from skilled therapeutic intervention in order to improve the following deficits and impairments:           Visit Diagnosis: Acute pain of right shoulder  Pain in right hand  Frontal lobe and executive function deficit  Other lack of coordination  Attention and concentration deficit  Muscle weakness (generalized)    Problem List Patient Active Problem List   Diagnosis Date Noted  . Dyspareunia in female 04/05/2020  . History of abnormal cervical Pap smear 03/10/2020  . GIB (gastrointestinal bleeding) 02/28/2020  . Elevated BUN   . Prediabetes   . 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 Cerebral vasospasm   . Sinus tachycardia   . Dysphagia, post-stroke   . Thrombocytopenia ([place])   . Acute blood loss anemia   . Brain aneurysm   . ICH (intracerebral hemorrhage) ([place]) 01/05/2020  . History of adenomatous polyp of colon 02/07/2016  . 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 Fissure in ano 11/11/2014  . Hemorrhoids 11/11/2014  . Constipation 11/19/2013  . GERD (gastroesophageal reflux disease) 03/24/2013  . Irritable bowel syndrome with diarrhea 03/24/2013  . Herpes zoster 05/14/2005  . Abnormal Pap smear of cervix 05/15/1983  . History of cervical dysplasia 05/15/1983    Erika Stanton 04/22/2020, 2:05 PM  [facility] [address] [place], [place], [address] Phone: [phone]   Fax:  [phone]  Name: Erika Stanton MRN: [id number] Date of Birth: July 29, 1955

## 2020-04-22 NOTE — Therapy (Signed)
[facility] [address] [place], [place], [address] Phone: [phone]   Fax:  [phone]  Physical Therapy Treatment/Recert  Patient Details  Name: Erika Stanton MRN: [id number] Date of Birth: August 20, 1955 Referring Provider (PT): Alger Simons, MD   Encounter Date: 04/22/2020   PT End of Session - 04/22/20 0936    Visit Number 19    Number of Visits 25    Date for PT Re-Evaluation 36/14/43   60 day cert, 30 day poc   Authorization Type [employer]    PT Start Time [phone]    PT Stop Time 1014    PT Time Calculation (min) 43 min    Equipment Utilized During Treatment Gait belt    Activity Tolerance Patient tolerated treatment well    Behavior During Therapy [facility] for tasks assessed/performed           Past Medical History:  Diagnosis Date  . Aneurysm ([place])   . Melena     Past Surgical History:  Procedure Laterality Date  . CHOLECYSTECTOMY    . COLONOSCOPY  11/2017   at [facility]. no recurrent polyps.  suggest repeat surveillance study 11/2022.    Marland Kitchen COLONOSCOPY W/ POLYPECTOMY  06/2014   Dr Roxy Manns at [facility].  3 adenomatoous polyps, anal fissure.    . ESOPHAGOGASTRODUODENOSCOPY (EGD) WITH PROPOFOL N/A 01/27/2020   Procedure: ESOPHAGOGASTRODUODENOSCOPY (EGD) WITH PROPOFOL;  Surgeon: Mauri Pole, MD;  Location: [place] [facility];  Service: Endoscopy;  Laterality: N/A;  . IR 3D INDEPENDENT WKST  01/06/2020  . IR ANGIO INTRA EXTRACRAN SEL INTERNAL CAROTID BILAT MOD SED  01/06/2020  . IR ANGIO VERTEBRAL SEL VERTEBRAL UNI L MOD SED  01/06/2020  . IR ANGIOGRAM FOLLOW UP STUDY  01/06/2020  . IR ANGIOGRAM FOLLOW UP STUDY  01/06/2020  . IR ANGIOGRAM FOLLOW UP STUDY  01/06/2020  . IR ANGIOGRAM FOLLOW UP STUDY  01/06/2020  . IR ANGIOGRAM FOLLOW UP STUDY  01/06/2020  . IR ANGIOGRAM FOLLOW UP STUDY  01/06/2020  . IR ANGIOGRAM FOLLOW UP STUDY  01/06/2020  . IR ANGIOGRAM FOLLOW UP STUDY  01/06/2020  . IR ANGIOGRAM FOLLOW UP STUDY   01/06/2020  . IR ANGIOGRAM FOLLOW UP STUDY  01/06/2020  . IR NEURO EACH ADD'L AFTER BASIC UNI RIGHT (MS)  01/06/2020  . IR TRANSCATH/EMBOLIZ  01/06/2020  . RADIOLOGY WITH ANESTHESIA N/A 01/06/2020   Procedure: IR WITH ANESTHESIA FOR ANEURYSM;  Surgeon: Consuella Lose, MD;  Location: [place];  Service: Radiology;  Laterality: N/A;  . TUBAL LIGATION      There were no vitals filed for this visit.   Subjective Assessment - 04/22/20 0937    Subjective Pt reports that she went to ED on Sunday as she was having a bad headache behind her eyes and this concerned her with aneurysm history. They did CT and did not find anything. She still cancelled on Monday. They gave her medicine in ER only for 1 time dose. Feels she is doing well with walking. Arm is biggest challenge but finally starting to notice some improvement. She did not see orthopedic doctor about shoulder as waited for an hour there and then had another appointment to get to. Going to hold off on going back for now.    Patient is accompained by: Family member    Limitations Walking;Standing;House hold activities    How long can you walk comfortably? Did a little walking yesterday up/down the hallway    Patient Stated Goals Use my  right hand like I used to.    Currently in Pain? Yes    Pain Score 1     Pain Location Shoulder    Pain Orientation Right    Pain Descriptors / Indicators Aching    Pain Type Chronic pain    Pain Onset More than a month ago    Pain Frequency Intermittent    Aggravating Factors  movement    Pain Onset More than a month ago              [facility] PT Assessment - 04/22/20 0940      Assessment   Medical Diagnosis B ICHs    Referring Provider (PT) Alger Simons, MD    Onset Date/Surgical Date 01/05/20      Functional Gait  Assessment   Gait assessed  Yes    Gait Level Surface Walks 20 ft in less than 7 sec but greater than 5.5 sec, uses assistive device, slower speed, mild gait deviations, or deviates 6-10  in outside of the 12 in walkway width.    Change in Gait Speed Able to smoothly change walking speed without loss of balance or gait deviation. Deviate no more than 6 in outside of the 12 in walkway width.    Gait with Horizontal Head Turns Performs head turns smoothly with no change in gait. Deviates no more than 6 in outside 12 in walkway width    Gait with Vertical Head Turns Performs head turns with no change in gait. Deviates no more than 6 in outside 12 in walkway width.    Gait and Pivot Turn Pivot turns safely within 3 sec and stops quickly with no loss of balance.    Step Over Obstacle Is able to step over 2 stacked shoe boxes taped together (9 in total height) without changing gait speed. No evidence of imbalance.    Gait with Narrow Base of Support Is able to ambulate for 10 steps heel to toe with no staggering.    Gait with Eyes Closed Walks 20 ft, uses assistive device, slower speed, mild gait deviations, deviates 6-10 in outside 12 in walkway width. Ambulates 20 ft in less than 9 sec but greater than 7 sec.    Ambulating Backwards Walks 20 ft, uses assistive device, slower speed, mild gait deviations, deviates 6-10 in outside 12 in walkway width.    Steps Alternating feet, must use rail.    Total Score 26                         [facility] Adult PT Treatment/Exercise - 04/22/20 0940      Transfers   Transfers Sit to Stand;Stand to Sit    Sit to Stand 6: Modified independent (Device/Increase time)    Five time sit to stand comments  13.54 sec without hands from mat    Stand to Sit 6: Modified independent (Device/Increase time)      Ambulation/Gait   Ambulation/Gait Yes    Ambulation/Gait Assistance 6: Modified independent (Device/Increase time);5: Supervision    Ambulation/Gait Assistance Details Pt performed dynamic gait activities during gait as well: walking over blue mat, stepping on/off airex, marching gait, sidestepping, backwards gait, tandem gait. Close  supervision with added activities. Verbal cues to relax right arm to try to get more arm swing. BP=122/78 after gait    Ambulation Distance (Feet) 1150 Feet    Assistive device None    Gait Pattern Step-through pattern;Decreased arm swing - right  Ambulation Surface Level;Unlevel;Indoor                  PT Education - 04/22/20 1944    Education Details PT discussed results of testing including 5 x sit to stand and FGA. Also discussed recert plan for 2x/week for 2 weeks followed by 1x/week for 2 weeks with her transfer to [employer]) Educated Patient    Methods Explanation    Comprehension Verbalized understanding            PT Short Term Goals - 03/23/20 1335      PT SHORT TERM GOAL #1   Title Pt/spouse will be independent with initial HEP in order to indicate decreased fall risk and improved functional mobility.  (Target Date: 03/26/20)    Baseline performing most of HEP 03/21/20    Time 4    Period Weeks    Status Achieved    Target Date 03/26/20      PT SHORT TERM GOAL #2   Title Pt will complete BERG balance test and improve score by 6 points from baseline in order to indicate decreased fall risk.    Baseline 46/56 on 03/01/20; 54/56 03/21/20    Time 4    Period Weeks    Status Achieved      PT SHORT TERM GOAL #3   Title Pt will improve 5TSS to </=25 secs with single UE support in order to indicate dec fall risk and improved functional strength.    Baseline 16 sec with one UE (03/21/20)    Time 4    Period Weeks    Status Achieved      PT SHORT TERM GOAL #4   Title Pt will improve gait speed to >/=2.4 ft/sec with LRAD in order to indicate dec fall risk.    Baseline 03/23/20 3.21 ft/sec    Time 4    Period Weeks    Status Achieved      PT SHORT TERM GOAL #5   Title Pt will ambulate x 57' at S level with LRAD in simulated household environment in order to indicate improved independence in her home.    Baseline 1050' without AD    Time 4     Period Weeks    Status Achieved             PT Long Term Goals - 04/22/20 8768      PT LONG TERM GOAL #1   Title Pt will be independent with progressive HEP for balance and strengthening to continue gains on own.    Baseline Pt reports that her PT exercises are going well and doing as instructed. Will continue to progress adding more core stabilization.    Time 8    Period Weeks    Status On-going      PT LONG TERM GOAL #2   Title Pt will increase FGA from 19/30 to >24/30 for improved balance and gait to decrease fall risk.    Baseline 03/23/20 19/30, 04/22/20 26/30    Time 8    Period Weeks    Status Achieved      PT LONG TERM GOAL #3   Title Pt will perform 5TSS in </=13 secs without UE support in order to indicate improved functional strength    Baseline 16 seconds with L UE support 03/21/20, 04/22/20 13.54 sec without hands from mat    Time 8    Period Weeks    Status Partially Met  PT LONG TERM GOAL #4   Title Pt will ambulate >1000' on varied surfaces independently with no LOB for improved community ambulation without AD.    Baseline 1050' including >300 feet of grass surface with SBA 03/21/20; we worked on ramp and curb also. 04/22/20 1150' mod I/supervision on varied surfaces inside due to cold.    Time 8    Period Weeks    Status Partially Met           Updated goals:  PT Short Term Goals - 04/22/20 1957      PT SHORT TERM GOAL #1   Title STGs=LTGs           PT Long Term Goals - 04/22/20 1957      PT LONG TERM GOAL #1   Title Pt will be independent with progressive HEP for balance and strengthening to continue gains on own.    Baseline Pt reports that her PT exercises are going well and doing as instructed. Will continue to progress adding more core stabilization.    Time 4    Period Weeks    Status On-going    Target Date 05/22/20      PT LONG TERM GOAL #2   Title Pt will increase FGA from 26/30 to 28/30 for improved balance and gait to  decrease fall risk.    Baseline 03/23/20 19/30, 04/22/20 26/30    Time 4    Period Weeks    Status Revised    Target Date 05/22/20      PT LONG TERM GOAL #3   Title Pt will perform 5TSS in </=11 secs without UE support in order to indicate improved balance and functional strength    Baseline 16 seconds with L UE support 03/21/20, 04/22/20 13.54 sec without hands from mat    Time 4    Period Weeks    Status Revised    Target Date 05/22/20      PT LONG TERM GOAL #4   Title Pt will ambulate >1000' on varied surfaces independently with no LOB for improved community ambulation without AD.    Baseline 1050' including >300 feet of grass surface with SBA 03/21/20; we worked on ramp and curb also. 04/22/20 1150' mod I/supervision on varied surfaces inside due to cold.    Time 4    Period Weeks    Status On-going    Target Date 05/22/20                Plan - 04/22/20 1012    Clinical Impression Statement Pt has made good progress towards all PT goals. She met FGA goal increasing score to 26/30 placing her in low fall risk category. She decreased 5 x sit to stand just short of goal at 13.54 sec indicating improving balance and functional strength. She is now able to ambulate >1000' without AD mod I on level surface and supervision on nonlevel surfaces. Pt does still have difficulty with activities that involve more core stability and will benefit from continued work on this. PT will plan to recert for 4 more weeks to continue to address BLE strengthening, core stabilization/postural control, and higher level balance.    Personal Factors and Comorbidities Age;Comorbidity 1;Profession    Comorbidities B ICHs    Examination-Activity Limitations Bend;Carry;Lift;Locomotion Level;Reach Overhead;Squat;Stairs;Stand;Transfers    Examination-Participation Restrictions Cleaning;Community Activity;Driving;Occupation;Yard Work;Laundry;Meal Prep    Stability/Clinical Decision Making Evolving/Moderate  complexity    Rehab Potential Good    PT Frequency 2x / week  then 1x/week for 2 weeks   PT Duration 2 weeks    PT Treatment/Interventions ADLs/Self Care Home Management;Aquatic Therapy;Electrical Stimulation;DME Instruction;Gait training;Stair training;Functional mobility training;Therapeutic activities;Therapeutic exercise;Balance training;Neuromuscular re-education;Cognitive remediation;Patient/family education;Orthotic Fit/Training;Manual techniques;Passive range of motion;Dry needling;Energy conservation;Vestibular;Visual/perceptual remediation/compensation    PT Next Visit Plan Recert was just completed for 2x/week for 2 weeks followed by 1x/week for 2 weeks for transfer to [facility]. Continue with dynamic gait and balance activities. Activities to try to incorporate more core and shoulder activation as able, LLE NMR. Pt does have chronic knee issues and is limited some due to that.    Consulted and Agree with Plan of Care Patient;Family member/caregiver           Patient will benefit from skilled therapeutic intervention in order to improve the following deficits and impairments:  Abnormal gait,Decreased activity tolerance,Decreased balance,Decreased cognition,Decreased endurance,Decreased knowledge of use of DME,Decreased mobility,Decreased strength,Impaired perceived functional ability,Impaired flexibility,Impaired sensation,Postural dysfunction,Improper body mechanics,Impaired vision/preception,Impaired UE functional use,Impaired tone  Visit Diagnosis: Other abnormalities of gait and mobility  Muscle weakness (generalized)  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 Dyspareunia in female 04/05/2020  . History of abnormal cervical Pap smear 03/10/2020  . GIB (gastrointestinal bleeding) 02/28/2020  . Elevated BUN   . Prediabetes   .  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[place]) 01/20/2020  . Cerebral vasospasm   . Sinus tachycardia   . Dysphagia, post-stroke   . Thrombocytopenia ([place])   . Acute blood loss anemia   . Brain aneurysm   . ICH (intracerebral hemorrhage) ([place]) 01/05/2020  . History of adenomatous polyp of colon 02/07/2016  . 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 Fissure in ano 11/11/2014  . Hemorrhoids 11/11/2014  . Constipation 11/19/2013  . GERD (gastroesophageal reflux disease) 03/24/2013  . Irritable bowel syndrome with diarrhea 03/24/2013  . Herpes zoster 05/14/2005  . Abnormal Pap smear of cervix 05/15/1983  . History of cervical dysplasia 05/15/1983    Electa Sniff, PT, DPT, NCS 04/22/2020, 7:57 PM  [facility] [address] [place], [place], [address] Phone: [phone]   Fax:  [phone]  Name: Erika Stanton MRN: [id number] Date of Birth: 1955-11-06

## 2020-04-24 ENCOUNTER — Other Ambulatory Visit: Payer: Self-pay | Admitting: Physical Medicine and Rehabilitation

## 2020-04-24 DIAGNOSIS — I607 Nontraumatic subarachnoid hemorrhage from unspecified intracranial artery: Secondary | ICD-10-CM

## 2020-04-25 ENCOUNTER — Other Ambulatory Visit: Payer: Self-pay

## 2020-04-25 ENCOUNTER — Ambulatory Visit: Payer: BC Managed Care – PPO | Attending: Physical Medicine & Rehabilitation | Admitting: Occupational Therapy

## 2020-04-25 ENCOUNTER — Ambulatory Visit: Payer: BC Managed Care – PPO | Admitting: Speech Pathology

## 2020-04-25 ENCOUNTER — Encounter: Payer: Self-pay | Admitting: Occupational Therapy

## 2020-04-25 ENCOUNTER — Ambulatory Visit
Admission: RE | Admit: 2020-04-25 | Discharge: 2020-04-25 | Disposition: A | Discharge status: Home / Self Care | Payer: BC Managed Care – PPO | Source: Ambulatory Visit | Attending: Physical Medicine and Rehabilitation | Admitting: Physical Medicine and Rehabilitation

## 2020-04-25 ENCOUNTER — Ambulatory Visit: Payer: BC Managed Care – PPO | Admitting: Physical Therapy

## 2020-04-25 ENCOUNTER — Encounter: Payer: Self-pay | Admitting: Physical Therapy

## 2020-04-25 DIAGNOSIS — R1312 Dysphagia, oropharyngeal phase: Secondary | ICD-10-CM | POA: Insufficient documentation

## 2020-04-25 DIAGNOSIS — R2689 Other abnormalities of gait and mobility: Secondary | ICD-10-CM

## 2020-04-25 DIAGNOSIS — I607 Nontraumatic subarachnoid hemorrhage from unspecified intracranial artery: Secondary | ICD-10-CM | POA: Diagnosis not present

## 2020-04-25 DIAGNOSIS — I69159 Hemiplegia and hemiparesis following nontraumatic intracerebral hemorrhage affecting unspecified side: Secondary | ICD-10-CM | POA: Insufficient documentation

## 2020-04-25 DIAGNOSIS — R41841 Cognitive communication deficit: Secondary | ICD-10-CM | POA: Insufficient documentation

## 2020-04-25 DIAGNOSIS — M6281 Muscle weakness (generalized): Secondary | ICD-10-CM

## 2020-04-25 DIAGNOSIS — R2681 Unsteadiness on feet: Secondary | ICD-10-CM | POA: Diagnosis present

## 2020-04-25 DIAGNOSIS — R278 Other lack of coordination: Secondary | ICD-10-CM | POA: Insufficient documentation

## 2020-04-25 NOTE — Therapy (Addendum)
[place] MAIN [facility] SERVICES [address] [place], [place], [address] Phone: [phone]   Fax:  [phone]  Occupational Therapy Treatment  Patient Details  Name: Erika Stanton MRN: [id number] Date of Birth: 1955/07/13 Referring Provider (OT): Dr. Naaman Plummer   Encounter Date: 04/25/2020   OT End of Session - 04/25/20 1554    Visit Number 16    Number of Visits 26    Date for OT Re-Evaluation 06/08/20    Authorization Time [place]    OT Start Time [phone]    OT Stop Time 1015    OT Time Calculation (min) 40 min    Activity Tolerance Patient tolerated treatment well    Behavior During Therapy [facility] for tasks assessed/performed           Past Medical History:  Diagnosis Date  . Aneurysm ([place])   . Melena     Past Surgical History:  Procedure Laterality Date  . CHOLECYSTECTOMY    . COLONOSCOPY  11/2017   at [facility]. no recurrent polyps.  suggest repeat surveillance study 11/2022.    Marland Kitchen COLONOSCOPY W/ POLYPECTOMY  06/2014   Dr Roxy Manns at [facility].  3 adenomatoous polyps, anal fissure.    . ESOPHAGOGASTRODUODENOSCOPY (EGD) WITH PROPOFOL N/A 01/27/2020   Procedure: ESOPHAGOGASTRODUODENOSCOPY (EGD) WITH PROPOFOL;  Surgeon: Mauri Pole, MD;  Location: [facility];  Service: Endoscopy;  Laterality: N/A;  . IR 3D INDEPENDENT WKST  01/06/2020  . IR ANGIO INTRA EXTRACRAN SEL INTERNAL CAROTID BILAT MOD SED  01/06/2020  . IR ANGIO VERTEBRAL SEL VERTEBRAL UNI L MOD SED  01/06/2020  . IR ANGIOGRAM FOLLOW UP STUDY  01/06/2020  . IR ANGIOGRAM FOLLOW UP STUDY  01/06/2020  . IR ANGIOGRAM FOLLOW UP STUDY  01/06/2020  . IR ANGIOGRAM FOLLOW UP STUDY  01/06/2020  . IR ANGIOGRAM FOLLOW UP STUDY  01/06/2020  . IR ANGIOGRAM FOLLOW UP STUDY  01/06/2020  . IR ANGIOGRAM FOLLOW UP STUDY  01/06/2020  . IR ANGIOGRAM FOLLOW UP STUDY  01/06/2020  . IR ANGIOGRAM FOLLOW UP STUDY  01/06/2020  . IR ANGIOGRAM FOLLOW UP STUDY  01/06/2020  . IR NEURO EACH ADD'L AFTER BASIC  UNI RIGHT (MS)  01/06/2020  . IR TRANSCATH/EMBOLIZ  01/06/2020  . RADIOLOGY WITH ANESTHESIA N/A 01/06/2020   Procedure: IR WITH ANESTHESIA FOR ANEURYSM;  Surgeon: Consuella Lose, MD;  Location: [place];  Service: Radiology;  Laterality: N/A;  . TUBAL LIGATION      There were no vitals filed for this visit.   Subjective Assessment - 04/25/20 1550    Subjective  Pt. reports new noste of changes to her LUE.    Currently in Pain? Yes    Pain Score 2     Pain Orientation Right          Pt. has transitioned care from neuro outpatient in [facility] to [facility] outpatient.  Pt. reports changes with her LUE occurring over the past couple of days. Pt. reports changes with her motor control, coordination, and grip. Pt. reports having difficulty picking up, and holding glasses/cup.  Measurements were obtained of the LUE: shoulder AROM flexion 122, abduction 88 degrees. strength 3-/5 shoulder strength, 4-/5 elbow flexion, 3+/5 wrist extension. L grip strength today was 7# which reflects a change form 13.6# at the initial eval. Pt.'s left [facility] skills were assessed via the 9 hole peg test today was 5 min. &13, which reflects a change from the initial evaluation which was 51.28 sec. Pt.  Tolerated heat modality to the right shoulder, and hand. Pt. Tolerated gentle scapular mobes for elevation, depression, abduction, and rotation. Pt. to continue with the current POC, and goals. Pt. reports having a CT scan scheduled. Pt.'s referring Physician, Dr. Naaman Plummer was notified of the changes noted this session.                      OT Education - 04/25/20 1551    Education Details LUE measurements    Person(s) Educated Patient;Spouse    Methods Explanation;Demonstration;Verbal cues;Handout    Comprehension Verbalized understanding;Returned demonstration;Verbal cues required            OT Short Term Goals - 04/13/20 1031      OT SHORT TERM GOAL #1   Title I with initial HEP.    Time --    Period  Weeks    Status Achieved    Target Date 04/13/20      OT SHORT TERM GOAL #2   Title Pt will perform bathing with min A.    Time --    Period Weeks    Status Achieved   Pt reports showering with min A     OT SHORT TERM GOAL #3   Title Pt will donn shirt and pants with min A    Time 4    Period Weeks    Status On-going   mod A     OT SHORT TERM GOAL #4   Title Pt will demonstrate 30 A/ROM shoulder flexion for RUE with pain less than or equal to 4/10.    Time 4    Period Weeks    Status On-going      OT SHORT TERM GOAL #5   Title Pt will  demonstrate improved LUE fine motor coordination for ADLs as evidenced by decreasing 9 hole peg test score to 45 secs or less.    Time --    Period Weeks    Status Achieved   42.34     OT SHORT TERM GOAL #6   Title Pt/ caregiver will verbalize understanding of RUE positioning and edema control techniques to minimize pain and risk for injury    Time 4    Period Weeks    Status On-going   pt uses a pillow, she has splint, needs reinforcement     OT SHORT TERM GOAL #7   Title Pt will demonstrate at least 25% finger flexion/ extension in RUE in prep for functional use.    Time 4    Period Weeks    Status On-going   grossly 20%, not consistent            OT Long Term Goals - 04/13/20 1122      OT LONG TERM GOAL #1   Title Pt will perfrom  all basic ADLS with supervision    Time 8    Period Weeks    Status On-going      OT LONG TERM GOAL #2   Title Pt will demonstrate 45* RUE shoulder flexion in prep for functional reach with pain less than or equal to 3/10.    Time 8    Period Weeks    Status On-going      OT LONG TERM GOAL #3   Title Pt will use RUE as a stabilizer/ gross A at least 30 % of the time for ADLs/ IADLs.    Period Weeks    Status On-going      OT  LONG TERM GOAL #4   Title Pt will increase LUE grip strength to 25 lbs or greater for increased ease with ADLs.    Time 8    Period Weeks    Status On-going      OT  LONG TERM GOAL #5   Title Pt will perform simple home management/ snack prep with min A.    Time 8    Period Weeks    Status On-going      OT LONG TERM GOAL #6   Title Pt will demonstrate ability to grasp/ release a cup 2/3 trials with RUE.    Time 8    Period Weeks    Status On-going      OT LONG TERM GOAL #7   Title Pt will safely navigate a busy environment and locate items with supervision and  90% or greater accuracy without bumping into items.    Time 8    Period Weeks    Status On-going      OT LONG TERM GOAL #8   Title I with updated HEP.    Time 8    Period Weeks    Status On-going                 Plan - 04/25/20 1555    Clinical Impression Statement Pt. has transitioned care from neuro outpatient in [facility] to [facility] outpatient.  Pt. reports changes with her LUE occurring over the past couple of days. Pt. reports changes with her motor control, coordination, and grip. Pt. reports having difficulty picking up, and holding glasses/cup.  Measurements were obtained of the LUE: shoulder AROM 122, abduction 88 degrees. strength 3-/5 shoulder strength, 4-/5 elbow flexion, 3+/5 wrist extension. L grip strength today was 7# which reflects a change form 13.6# at the initial eval. Pt.'s left [facility] skills were assessed via the 9 hole peg test today was 5 min. &13, which reflects a change from the initial evaluation which was 51.28 sec. Pt. Tolerated heat modality to the right shoulder, and hand. Pt. Tolerated gentle scapular mobes for elevation, depression, abduction, and rotation. Pt. to continue with the current POC, and goals. Pt. reports having a CT scan scheduled. Pt.'s referring Physician, Dr. Naaman Plummer was notified of the changes noted this session.   OT Occupational Profile and History Detailed Assessment- Review of Records and additional review of physical, cognitive, psychosocial history related to current functional performance    Occupational performance deficits (Please  refer to evaluation for details): ADL's;IADL's;Rest and Sleep;Work;Play;Leisure    Body Structure / Function / Physical Skills ADL;Balance;Coordination;Decreased knowledge of precautions;Decreased knowledge of use of DME;Dexterity;Edema;Mobility;Tone;Strength;IADL;Sensation;GMC;Gait;ROM;FMC;Flexibility;Pain;Vision;UE functional use;Endurance    Cognitive Skills Attention;Perception;Problem Solve;Safety Awareness;Thought    Rehab Potential Good    Clinical Decision Making Several treatment options, min-mod task modification necessary    Comorbidities Affecting Occupational Performance: None    Modification or Assistance to Complete Evaluation  Min-Moderate modification of tasks or assist with assess necessary to complete eval    OT Frequency 3x / week    OT Duration 8 weeks    OT Treatment/Interventions Self-care/ADL training;Ultrasound;Energy conservation;Visual/perceptual remediation/compensation;Patient/family education;DME and/or AE instruction;Aquatic Therapy;Paraffin;Gait Training;Passive range of motion;Balance training;Fluidtherapy;Electrical Stimulation;Functional Mobility Training;Splinting;Moist Heat;Therapeutic exercise;Manual Therapy;Cognitive remediation/compensation;Manual lymph drainage;Neuromuscular education;Coping strategies training    Consulted and Agree with Plan of Care Patient;Family member/caregiver           Patient will benefit from skilled therapeutic intervention in order to improve the following deficits and impairments:   Body Structure / Function /  Physical Skills: ADL,Balance,Coordination,Decreased knowledge of precautions,Decreased knowledge of use of DME,Dexterity,Edema,Mobility,Tone,Strength,IADL,Sensation,GMC,Gait,ROM,FMC,Flexibility,Pain,Vision,UE functional use,Endurance Cognitive Skills: Attention,Perception,Problem Solve,Safety Awareness,Thought     Visit Diagnosis: Muscle weakness (generalized)  Other lack of coordination    Problem List Patient  Active Problem List   Diagnosis Date Noted  . Dyspareunia in female 04/05/2020  . History of abnormal cervical Pap smear 03/10/2020  . GIB (gastrointestinal bleeding) 02/28/2020  . Elevated BUN   . Prediabetes   . 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 Cerebral vasospasm   . Sinus tachycardia   . Dysphagia, post-stroke   . Thrombocytopenia ([place])   . Acute blood loss anemia   . Brain aneurysm   . ICH (intracerebral hemorrhage) ([place]) 01/05/2020  . History of adenomatous polyp of colon 02/07/2016  . 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 Fissure in ano 11/11/2014  . Hemorrhoids 11/11/2014  . Constipation 11/19/2013  . GERD (gastroesophageal reflux disease) 03/24/2013  . Irritable bowel syndrome with diarrhea 03/24/2013  . Herpes zoster 05/14/2005  . Abnormal Pap smear of cervix 05/15/1983  . History of cervical dysplasia 05/15/1983    Harrel Carina, MS, OTR/L 04/25/2020, 4:04 PM  [place] MAIN [facility] SERVICES [address] [place], [place], [address] Phone: [phone]   Fax:  [phone]  Name: Erika Stanton MRN: [id number] Date of Birth: 03-27-1956

## 2020-04-25 NOTE — Therapy (Signed)
[place] MAIN [facility] SERVICES [address] [place], [place], [address] Phone: [phone]   Fax:  [phone]  Speech Language Pathology Treatment  Patient Details  Name: Erika Stanton MRN: [id number] Date of Birth: March 02, 1956 Referring Provider (SLP): Alger Simons, MD   Encounter Date: 04/25/2020   End of Session - 04/25/20 1405    Visit Number 14    Number of Visits 17    Date for SLP Re-Evaluation 05/27/20    SLP Start Time 59    SLP Stop Time  1152    SLP Time Calculation (min) 50 min    Activity Tolerance Patient tolerated treatment well           Past Medical History:  Diagnosis Date  . Aneurysm ([place])   . Melena     Past Surgical History:  Procedure Laterality Date  . CHOLECYSTECTOMY    . COLONOSCOPY  11/2017   at [facility]. no recurrent polyps.  suggest repeat surveillance study 11/2022.    Marland Kitchen COLONOSCOPY W/ POLYPECTOMY  06/2014   Dr Roxy Manns at [facility].  3 adenomatoous polyps, anal fissure.    . ESOPHAGOGASTRODUODENOSCOPY (EGD) WITH PROPOFOL N/A 01/27/2020   Procedure: ESOPHAGOGASTRODUODENOSCOPY (EGD) WITH PROPOFOL;  Surgeon: Mauri Pole, MD;  Location: [facility];  Service: Endoscopy;  Laterality: N/A;  . IR 3D INDEPENDENT WKST  01/06/2020  . IR ANGIO INTRA EXTRACRAN SEL INTERNAL CAROTID BILAT MOD SED  01/06/2020  . IR ANGIO VERTEBRAL SEL VERTEBRAL UNI L MOD SED  01/06/2020  . IR ANGIOGRAM FOLLOW UP STUDY  01/06/2020  . IR ANGIOGRAM FOLLOW UP STUDY  01/06/2020  . IR ANGIOGRAM FOLLOW UP STUDY  01/06/2020  . IR ANGIOGRAM FOLLOW UP STUDY  01/06/2020  . IR ANGIOGRAM FOLLOW UP STUDY  01/06/2020  . IR ANGIOGRAM FOLLOW UP STUDY  01/06/2020  . IR ANGIOGRAM FOLLOW UP STUDY  01/06/2020  . IR ANGIOGRAM FOLLOW UP STUDY  01/06/2020  . IR ANGIOGRAM FOLLOW UP STUDY  01/06/2020  . IR ANGIOGRAM FOLLOW UP STUDY  01/06/2020  . IR NEURO EACH ADD'L AFTER BASIC UNI RIGHT (MS)  01/06/2020  . IR TRANSCATH/EMBOLIZ  01/06/2020  . RADIOLOGY WITH  ANESTHESIA N/A 01/06/2020   Procedure: IR WITH ANESTHESIA FOR ANEURYSM;  Surgeon: Consuella Lose, MD;  Location: [place];  Service: Radiology;  Laterality: N/A;  . TUBAL LIGATION      There were no vitals filed for this visit.   Subjective Assessment - 04/25/20 1356    Subjective "I need to have another CT" for new onset LUE discoordination    Currently in Pain? Yes    Pain Score 2     Pain Location Shoulder                 ADULT SLP TREATMENT - 04/25/20 0001      General Information   Behavior/Cognition Alert;Cooperative;Impulsive;Pleasant mood      Treatment Provided   Treatment provided Cognitive-Linquistic      Cognitive-Linquistic Treatment   Treatment focused on Cognition    Skilled Treatment ALTERNATING ATTN/EXECUTIVE FUNCTION: Patient demo'd interest in new TalkPath activities, so SLP demo'd to pt how to access additional tasks. With simple map activity, patient completed 90% accuracy with extended time; she required cue x2 for L inattention/visual scanning to L. Alternated attention between task and simple conversation functionally, independently playing a repetition of instructions x1 to resume task after brief distraction. VISUOSPATIAL/EXECUTIVE: Patient required occasional min cues to reduce impulsivity, increase preplanning/strategizing in design/organization  task, 100% accuracy with extended time. FUNCTIONAL: When leaving therapy room, pt required cues x2 for impulsivity navigating back to lobby (she had been down hall x2 for PT, OT sessions prior).      Assessment / Recommendations / Plan   Plan Continue with current plan of care      Progression Toward Goals   Progression toward goals Progressing toward goals            SLP Education - 04/25/20 1403    Education Details make a plan before acting    Person(s) Educated Patient    Methods Explanation    Comprehension Verbalized understanding            SLP Short Term Goals - 04/13/20 1137      SLP  SHORT TERM GOAL #1   Title pt to complete cognitive linguistic testing    Period --   or 4 total sessions   Status Achieved      SLP SHORT TERM GOAL #2   Title pt will demo emergent awareness 100% with min complex therapy tasks x3 visits    Baseline 03-30-20;    Status Partially Met      SLP SHORT TERM GOAL #3   Title pt will demo appriopriate aspiration precuations with suggested POs x3 sessions    Status Deferred      SLP SHORT TERM GOAL #4   Title pt will solve household probelms using functional solutions x3 sessions    Baseline 03-30-20; 04-11-20    Status Achieved      SLP SHORT TERM GOAL #5   Title pt will demo selective attention (internal distraction) to a simple functional task for 10 minutes with rare min A back to task in 3 sessions    Baseline 03-28-20    Period Weeks    Status Partially Met            SLP Long Term Goals - 04/20/20 1711      SLP LONG TERM GOAL #1   Title pt will demo anticipatory awareness by double checking all work with a non-verbal cue x3 sessions    Time 3    Period Weeks   or 17 total sessions, for all LTGs   Status Revised      SLP LONG TERM GOAL #2   Title pt will follow swallow precautions with POs with modified independence x3 sessions    Status Deferred      SLP LONG TERM GOAL #3   Title pt will demo [facility] skills in solving min-mod complex problems in WNL amount of time with modified independence (double checking answers , etc)    Time 4    Period Weeks    Status Revised      SLP LONG TERM GOAL #4   Title pt will demo simple alternating attention in functional cognitive linguistic tasks in 3 sessions    Time 3    Period Weeks    Status On-going            Plan - 04/25/20 1405    Clinical Impression Statement Shaunda's impulsivity affected her minimally in structured therapy tasks today, but more so functionally (navigating in a relatively unfamiliar environment). Continues to require cues for awareness and problem  solving. Pt denies dysphagia impacting her; she wishes to continue focus on cognitive deficits at this time. Patient expressed it is likely she will not return to work; she expressed that she could not do so at this time with  current deficits. Continue skilled ST to maximize cognition for improved independence, safety and quality of life.    Speech Therapy Frequency 2x / week    Duration 8 weeks   17 visits   Treatment/Interventions Aspiration precaution training;Pharyngeal strengthening exercises;Compensatory techniques;Diet toleration management by SLP;Trials of upgraded texture/liquids;Cueing hierarchy;Cognitive reorganization;Internal/external aids;Patient/family education;SLP instruction and feedback;Functional tasks    Potential to Achieve Goals Good           Patient will benefit from skilled therapeutic intervention in order to improve the following deficits and impairments:   Cognitive communication deficit    Problem List Patient Active Problem List   Diagnosis Date Noted  . Dyspareunia in female 04/05/2020  . History of abnormal cervical Pap smear 03/10/2020  . GIB (gastrointestinal bleeding) 02/28/2020  . Elevated BUN   . Prediabetes   . 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 Cerebral vasospasm   . Sinus tachycardia   . Dysphagia, post-stroke   . Thrombocytopenia ([place])   . Acute blood loss anemia   . Brain aneurysm   . ICH (intracerebral hemorrhage) ([place]) 01/05/2020  . History of adenomatous polyp of colon 02/07/2016  . 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 Fissure in ano 11/11/2014  . Hemorrhoids 11/11/2014  . Constipation 11/19/2013  . GERD (gastroesophageal reflux disease) 03/24/2013  . Irritable bowel syndrome with diarrhea 03/24/2013  . Herpes zoster 05/14/2005  . Abnormal Pap smear of cervix 05/15/1983  . History of cervical dysplasia 05/15/1983   [name], [place], CCC-SLP Speech-Language Pathologist  Aliene Altes 04/25/2020, 2:09  PM  [place] MAIN [facility] SERVICES [address] [place], [place], [address] Phone: [phone]   Fax:  [phone]   Name: CHAI VERDEJO MRN: [id number] Date of Birth: 1955/08/10

## 2020-04-25 NOTE — Therapy (Signed)
[place] MAIN [facility] SERVICES [address] [place], [place], [address] Phone: [phone]   Fax:  [phone]  Physical Therapy Treatment/Cancellation  Patient Details  Name: Erika Stanton MRN: [id number] Date of Birth: 02-20-1956 Referring Provider (PT): Alger Simons, MD   Encounter Date: 04/25/2020   PT End of Session - 04/25/20 1101    Visit Number 19    Number of Visits 25    Date for PT Re-Evaluation 02/40/97   60 day cert, 30 day poc   Authorization Type [employer] State    PT Start Time 3532    PT Stop Time 1030    PT Time Calculation (min) 15 min    Equipment Utilized During Treatment Gait belt    Activity Tolerance Patient tolerated treatment well    Behavior During Therapy [facility] for tasks assessed/performed           Past Medical History:  Diagnosis Date  . Aneurysm ([place])   . Melena     Past Surgical History:  Procedure Laterality Date  . CHOLECYSTECTOMY    . COLONOSCOPY  11/2017   at [facility]. no recurrent polyps.  suggest repeat surveillance study 11/2022.    Marland Kitchen COLONOSCOPY W/ POLYPECTOMY  06/2014   Dr Roxy Manns at [facility].  3 adenomatoous polyps, anal fissure.    . ESOPHAGOGASTRODUODENOSCOPY (EGD) WITH PROPOFOL N/A 01/27/2020   Procedure: ESOPHAGOGASTRODUODENOSCOPY (EGD) WITH PROPOFOL;  Surgeon: Mauri Pole, MD;  Location: [facility];  Service: Endoscopy;  Laterality: N/A;  . IR 3D INDEPENDENT WKST  01/06/2020  . IR ANGIO INTRA EXTRACRAN SEL INTERNAL CAROTID BILAT MOD SED  01/06/2020  . IR ANGIO VERTEBRAL SEL VERTEBRAL UNI L MOD SED  01/06/2020  . IR ANGIOGRAM FOLLOW UP STUDY  01/06/2020  . IR ANGIOGRAM FOLLOW UP STUDY  01/06/2020  . IR ANGIOGRAM FOLLOW UP STUDY  01/06/2020  . IR ANGIOGRAM FOLLOW UP STUDY  01/06/2020  . IR ANGIOGRAM FOLLOW UP STUDY  01/06/2020  . IR ANGIOGRAM FOLLOW UP STUDY  01/06/2020  . IR ANGIOGRAM FOLLOW UP STUDY  01/06/2020  . IR ANGIOGRAM FOLLOW UP STUDY  01/06/2020  . IR ANGIOGRAM FOLLOW UP STUDY   01/06/2020  . IR ANGIOGRAM FOLLOW UP STUDY  01/06/2020  . IR NEURO EACH ADD'L AFTER BASIC UNI RIGHT (MS)  01/06/2020  . IR TRANSCATH/EMBOLIZ  01/06/2020  . RADIOLOGY WITH ANESTHESIA N/A 01/06/2020   Procedure: IR WITH ANESTHESIA FOR ANEURYSM;  Surgeon: Consuella Lose, MD;  Location: [place];  Service: Radiology;  Laterality: N/A;  . TUBAL LIGATION      There were no vitals filed for this visit.   Subjective Assessment - 04/25/20 1023    Subjective Patient reports a change over the weekend with more impaired motor control on LUE; She reports difficulty controlling LUE hand. She was recently in the ED a week ago with a migraine, she did have new scans done which were negative for acute change; She is scheduled for additional scans today or tomorrow; She does know about another aneurysm but it was stable the last time it was assessed;    Patient is accompained by: Family member    Limitations Walking;Standing;House hold activities    How long can you walk comfortably? Did a little walking yesterday up/down the hallway    Patient Stated Goals Use my right hand like I used to.    Currently in Pain? Yes    Pain Score 2     Pain Location Shoulder  Pain Orientation Right    Pain Descriptors / Indicators Aching    Pain Type Chronic pain    Pain Radiating Towards radiates into right hand;    Pain Onset More than a month ago    Pain Frequency Constant    Aggravating Factors  movement    Pain Relieving Factors heat/rest    Effect of Pain on Daily Activities decreased activity tolerance;    Multiple Pain Sites No    Pain Onset More than a month ago              Patient presents to PT appointment with worsening LUE hand coordination and weakness. Patient reports this has been going on over the weekend. She was recently in the ED a week ago and had a scan with no acute changes. However her LUE incoordination just recently started. She has a history of multiple aneurysms. She is scheduled for a  new CT scan today/tomorrow.  BP 125/71  Recommend therapy session be cancelled today due to worsening symptoms and have patient have scans completed/assesment to rule out worsening aneurysm.  Patient agreeable  Will resume PT appointments when patient is appropriate.                          PT Short Term Goals - 04/22/20 1957      PT SHORT TERM GOAL #1   Title STGs=LTGs             PT Long Term Goals - 04/22/20 1957      PT LONG TERM GOAL #1   Title Pt will be independent with progressive HEP for balance and strengthening to continue gains on own.    Baseline Pt reports that her PT exercises are going well and doing as instructed. Will continue to progress adding more core stabilization.    Time 4    Period Weeks    Status On-going    Target Date 05/22/20      PT LONG TERM GOAL #2   Title Pt will increase FGA from 26/30 to 28/30 for improved balance and gait to decrease fall risk.    Baseline 03/23/20 19/30, 04/22/20 26/30    Time 4    Period Weeks    Status Revised    Target Date 05/22/20      PT LONG TERM GOAL #3   Title Pt will perform 5TSS in </=11 secs without UE support in order to indicate improved balance and functional strength    Baseline 16 seconds with L UE support 03/21/20, 04/22/20 13.54 sec without hands from mat    Time 4    Period Weeks    Status Revised    Target Date 05/22/20      PT LONG TERM GOAL #4   Title Pt will ambulate >1000' on varied surfaces independently with no LOB for improved community ambulation without AD.    Baseline 1050' including >300 feet of grass surface with SBA 03/21/20; we worked on ramp and curb also. 04/22/20 1150' mod I/supervision on varied surfaces inside due to cold.    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 Dyspareunia in female 04/05/2020  . History of abnormal cervical Pap smear 03/10/2020  . GIB (gastrointestinal bleeding) 02/28/2020  . Elevated BUN   . Prediabetes   . 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 Cerebral vasospasm   . Sinus tachycardia   . Dysphagia, post-stroke   . Thrombocytopenia ([place])   . Acute blood loss anemia   . Brain aneurysm   . ICH (intracerebral hemorrhage) ([place]) 01/05/2020  . History of adenomatous polyp of colon 02/07/2016  . 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 Fissure in ano 11/11/2014  . Hemorrhoids 11/11/2014  . Constipation 11/19/2013  . GERD (gastroesophageal reflux disease) 03/24/2013  . Irritable bowel syndrome with diarrhea 03/24/2013  . Herpes zoster 05/14/2005  . Abnormal Pap smear of cervix 05/15/1983  . History of cervical dysplasia 05/15/1983    Vernon Ariel PT, DPT 04/25/2020, 11:01 AM  [place] MAIN [facility] SERVICES [address] [place], [place], [address] Phone: [phone]   Fax:  [phone]  Name: BLESSING ZAUCHA MRN: [id number] Date of Birth: 1955-12-25

## 2020-04-26 ENCOUNTER — Ambulatory Visit: Payer: BC Managed Care – PPO | Admitting: Physical Medicine and Rehabilitation

## 2020-04-27 ENCOUNTER — Other Ambulatory Visit: Payer: Self-pay | Admitting: Neurosurgery

## 2020-04-27 ENCOUNTER — Encounter: Payer: BC Managed Care – PPO | Admitting: Occupational Therapy

## 2020-04-27 ENCOUNTER — Ambulatory Visit: Payer: BC Managed Care – PPO | Admitting: Physical Therapy

## 2020-04-27 ENCOUNTER — Encounter: Payer: BC Managed Care – PPO | Admitting: Speech Pathology

## 2020-04-27 DIAGNOSIS — I609 Nontraumatic subarachnoid hemorrhage, unspecified: Secondary | ICD-10-CM

## 2020-04-29 ENCOUNTER — Ambulatory Visit: Payer: BC Managed Care – PPO | Admitting: Occupational Therapy

## 2020-04-29 ENCOUNTER — Other Ambulatory Visit (HOSPITAL_COMMUNITY): Payer: BC Managed Care – PPO

## 2020-05-02 ENCOUNTER — Encounter: Payer: Self-pay | Admitting: Physical Therapy

## 2020-05-02 ENCOUNTER — Other Ambulatory Visit: Payer: Self-pay

## 2020-05-02 ENCOUNTER — Encounter: Payer: Self-pay | Admitting: Occupational Therapy

## 2020-05-02 ENCOUNTER — Ambulatory Visit: Payer: BC Managed Care – PPO | Admitting: Speech Pathology

## 2020-05-02 ENCOUNTER — Ambulatory Visit: Payer: BC Managed Care – PPO | Admitting: Physical Therapy

## 2020-05-02 ENCOUNTER — Ambulatory Visit: Payer: BC Managed Care – PPO | Admitting: Occupational Therapy

## 2020-05-02 DIAGNOSIS — M6281 Muscle weakness (generalized): Secondary | ICD-10-CM | POA: Diagnosis not present

## 2020-05-02 DIAGNOSIS — R2681 Unsteadiness on feet: Secondary | ICD-10-CM

## 2020-05-02 DIAGNOSIS — R2689 Other abnormalities of gait and mobility: Secondary | ICD-10-CM

## 2020-05-02 DIAGNOSIS — R278 Other lack of coordination: Secondary | ICD-10-CM

## 2020-05-02 DIAGNOSIS — R41841 Cognitive communication deficit: Secondary | ICD-10-CM

## 2020-05-02 NOTE — Therapy (Signed)
[place] MAIN [facility] SERVICES [address] [place], [place], [address] Phone: [phone]   Fax:  [phone]  Occupational Therapy Treatment  Patient Details  Name: Erika Stanton MRN: [id number] Date of Birth: 1955/10/13 Referring Provider (OT): Dr. Naaman Plummer   Encounter Date: 05/02/2020   OT End of Session - 05/02/20 1027    Visit Number 17    Number of Visits 26    Date for OT Re-Evaluation 06/08/20    OT Start Time 0930    OT Stop Time 1015    OT Time Calculation (min) 45 min    Activity Tolerance Patient tolerated treatment well    Behavior During Therapy [facility] for tasks assessed/performed           Past Medical History:  Diagnosis Date  . Aneurysm ([place])   . Melena     Past Surgical History:  Procedure Laterality Date  . CHOLECYSTECTOMY    . COLONOSCOPY  11/2017   at [facility]. no recurrent polyps.  suggest repeat surveillance study 11/2022.    Marland Kitchen COLONOSCOPY W/ POLYPECTOMY  06/2014   Dr Roxy Manns at [facility].  3 adenomatoous polyps, anal fissure.    . ESOPHAGOGASTRODUODENOSCOPY (EGD) WITH PROPOFOL N/A 01/27/2020   Procedure: ESOPHAGOGASTRODUODENOSCOPY (EGD) WITH PROPOFOL;  Surgeon: Mauri Pole, MD;  Location: [facility];  Service: Endoscopy;  Laterality: N/A;  . IR 3D INDEPENDENT WKST  01/06/2020  . IR ANGIO INTRA EXTRACRAN SEL INTERNAL CAROTID BILAT MOD SED  01/06/2020  . IR ANGIO VERTEBRAL SEL VERTEBRAL UNI L MOD SED  01/06/2020  . IR ANGIOGRAM FOLLOW UP STUDY  01/06/2020  . IR ANGIOGRAM FOLLOW UP STUDY  01/06/2020  . IR ANGIOGRAM FOLLOW UP STUDY  01/06/2020  . IR ANGIOGRAM FOLLOW UP STUDY  01/06/2020  . IR ANGIOGRAM FOLLOW UP STUDY  01/06/2020  . IR ANGIOGRAM FOLLOW UP STUDY  01/06/2020  . IR ANGIOGRAM FOLLOW UP STUDY  01/06/2020  . IR ANGIOGRAM FOLLOW UP STUDY  01/06/2020  . IR ANGIOGRAM FOLLOW UP STUDY  01/06/2020  . IR ANGIOGRAM FOLLOW UP STUDY  01/06/2020  . IR NEURO EACH ADD'L AFTER BASIC UNI RIGHT (MS)  01/06/2020  . IR  TRANSCATH/EMBOLIZ  01/06/2020  . RADIOLOGY WITH ANESTHESIA N/A 01/06/2020   Procedure: IR WITH ANESTHESIA FOR ANEURYSM;  Surgeon: Consuella Lose, MD;  Location: [place];  Service: Radiology;  Laterality: N/A;  . TUBAL LIGATION      There were no vitals filed for this visit.   Subjective Assessment - 05/02/20 1024    Subjective  Pt. reports that her LUE has improved since seeing the neurosurgeon, and starting back on blood thinners.    Pertinent History ICH 01/06/20, Aneurysm. PMH: OA bilateral knees and hands    Currently in Pain? Yes    Pain Score 3     Pain Location Arm    Pain Orientation Right    Pain Descriptors / Indicators Aching;Sore    Pain Type Chronic pain    Pain Onset More than a month ago    Pain Score 3    Pain Location Hand    Pain Orientation Right    Pain Descriptors / Indicators Aching    Pain Type Chronic pain          OT TREATMENT    Therapeutic Exercise:  Pt. performed tolerated minimal shoulder abduction, and flexion with 2-3/10 pain. Pt. was able to tolerate AAROM for elbow flexion, elbow extension with resistance, AAROM supination with PROM to  the end range.  PROM for right wrist, digit MP, PIP, and DIP flexion, and extension. Pt. Tolerated moist heat modality to the right shoulder, and right prior to ROM.  Manual Therapy:  Pt. tolerated right scapular mobes in sitting for elevation, depression, abduction/rotation. Mobilizations were performed prior to, independent of, and in preparation for ROM.    Pt. reports that her left UE, and  hand has improved following her appointment with the neurosurgeon. Pt. reports that she has resumed taking her her blood thinner. Pt. Reports that she is waiting for the insurance to approve an MRI. Pt. Tolerated PROM in the right shoulder, wrist, and fingers. Pt.'s ROM continues to be limited by pain. Pt. Reports 2-3/10 pain in the right shoulder, digits, and thumb. Pt. was able to achieve increased extension with PROM, and  self-ROM in the right digit MP, PIP, and DIPs, and thumb palmar abduction with decreased pain following slow gentle ROM. Pt. reported pain initially, however was improved with ROM. Pt. Is able to initiate active right thumb, and 2nd digit movement to initiate formulating a lateral grasp pattern.                      OT Education - 05/02/20 1026    Education Details RUE ROM    Person(s) Educated Patient    Methods Explanation;Demonstration;Verbal cues;Handout    Comprehension Verbalized understanding;Returned demonstration;Verbal cues required            OT Short Term Goals - 04/13/20 1031      OT SHORT TERM GOAL #1   Title I with initial HEP.    Time --    Period Weeks    Status Achieved    Target Date 04/13/20      OT SHORT TERM GOAL #2   Title Pt will perform bathing with min A.    Time --    Period Weeks    Status Achieved   Pt reports showering with min A     OT SHORT TERM GOAL #3   Title Pt will donn shirt and pants with min A    Time 4    Period Weeks    Status On-going   mod A     OT SHORT TERM GOAL #4   Title Pt will demonstrate 30 A/ROM shoulder flexion for RUE with pain less than or equal to 4/10.    Time 4    Period Weeks    Status On-going      OT SHORT TERM GOAL #5   Title Pt will  demonstrate improved LUE fine motor coordination for ADLs as evidenced by decreasing 9 hole peg test score to 45 secs or less.    Time --    Period Weeks    Status Achieved   42.34     OT SHORT TERM GOAL #6   Title Pt/ caregiver will verbalize understanding of RUE positioning and edema control techniques to minimize pain and risk for injury    Time 4    Period Weeks    Status On-going   pt uses a pillow, she has splint, needs reinforcement     OT SHORT TERM GOAL #7   Title Pt will demonstrate at least 25% finger flexion/ extension in RUE in prep for functional use.    Time 4    Period Weeks    Status On-going   grossly 20%, not consistent  OT Long Term Goals - 04/13/20 1122      OT LONG TERM GOAL #1   Title Pt will perfrom  all basic ADLS with supervision    Time 8    Period Weeks    Status On-going      OT LONG TERM GOAL #2   Title Pt will demonstrate 45* RUE shoulder flexion in prep for functional reach with pain less than or equal to 3/10.    Time 8    Period Weeks    Status On-going      OT LONG TERM GOAL #3   Title Pt will use RUE as a stabilizer/ gross A at least 30 % of the time for ADLs/ IADLs.    Period Weeks    Status On-going      OT LONG TERM GOAL #4   Title Pt will increase LUE grip strength to 25 lbs or greater for increased ease with ADLs.    Time 8    Period Weeks    Status On-going      OT LONG TERM GOAL #5   Title Pt will perform simple home management/ snack prep with min A.    Time 8    Period Weeks    Status On-going      OT LONG TERM GOAL #6   Title Pt will demonstrate ability to grasp/ release a cup 2/3 trials with RUE.    Time 8    Period Weeks    Status On-going      OT LONG TERM GOAL #7   Title Pt will safely navigate a busy environment and locate items with supervision and  90% or greater accuracy without bumping into items.    Time 8    Period Weeks    Status On-going      OT LONG TERM GOAL #8   Title I with updated HEP.    Time 8    Period Weeks    Status On-going                 Plan - 05/02/20 1027    Clinical Impression Statement Pt. reports that her left UE, and  hand has improved following her appointment with the neurosurgeon. Pt. reports that she has resumed taking her her blood thinner. Pt. Reports that she is waiting for the insurance to approve an MRI. Pt. Tolerated PROM in the right shoulder, wrist, and fingers. Pt.'s ROM continues to be limited by pain. Pt. Reports 2-3/10 pain in the right shoulder, digits, and thumb. Pt. was able to achieve increased extension with PROM, and self-ROM in the right digit MP, PIP, and DIPs, and thumb palmar  abduction with decreased pain following slow gentle ROM. Pt. reported pain initially, however was improved with ROM. Pt. Is able to initiate active right thumb, and 2nd digit movement to initiate formulating a lateral grasp pattern.     OT Occupational Profile and History Detailed Assessment- Review of Records and additional review of physical, cognitive, psychosocial history related to current functional performance    Occupational performance deficits (Please refer to evaluation for details): ADL's;IADL's;Rest and Sleep;Work;Play;Leisure    Body Structure / Function / Physical Skills ADL;Balance;Coordination;Decreased knowledge of precautions;Decreased knowledge of use of DME;Dexterity;Edema;Mobility;Tone;Strength;IADL;Sensation;GMC;Gait;ROM;FMC;Flexibility;Pain;Vision;UE functional use;Endurance    Rehab Potential Good    Clinical Decision Making Several treatment options, min-mod task modification necessary    Comorbidities Affecting Occupational Performance: None    Modification or Assistance to Complete Evaluation  Min-Moderate modification of tasks or  assist with assess necessary to complete eval    OT Frequency 3x / week    OT Duration 8 weeks    OT Treatment/Interventions Self-care/ADL training;Ultrasound;Energy conservation;Visual/perceptual remediation/compensation;Patient/family education;DME and/or AE instruction;Aquatic Therapy;Paraffin;Gait Training;Passive range of motion;Balance training;Fluidtherapy;Electrical Stimulation;Functional Mobility Training;Splinting;Moist Heat;Therapeutic exercise;Manual Therapy;Cognitive remediation/compensation;Manual lymph drainage;Neuromuscular education;Coping strategies training    Consulted and Agree with Plan of Care Patient;Family member/caregiver           Patient will benefit from skilled therapeutic intervention in order to improve the following deficits and impairments:   Body Structure / Function / Physical Skills:  ADL,Balance,Coordination,Decreased knowledge of precautions,Decreased knowledge of use of DME,Dexterity,Edema,Mobility,Tone,Strength,IADL,Sensation,GMC,Gait,ROM,FMC,Flexibility,Pain,Vision,UE functional use,Endurance       Visit Diagnosis: No diagnosis found.    Problem List Patient Active Problem List   Diagnosis Date Noted  . Dyspareunia in female 04/05/2020  . History of abnormal cervical Pap smear 03/10/2020  . GIB (gastrointestinal bleeding) 02/28/2020  . Elevated BUN   . Prediabetes   . 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 Cerebral vasospasm   . Sinus tachycardia   . Dysphagia, post-stroke   . Thrombocytopenia ([place])   . Acute blood loss anemia   . Brain aneurysm   . ICH (intracerebral hemorrhage) ([place]) 01/05/2020  . History of adenomatous polyp of colon 02/07/2016  . 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 Fissure in ano 11/11/2014  . Hemorrhoids 11/11/2014  . Constipation 11/19/2013  . GERD (gastroesophageal reflux disease) 03/24/2013  . Irritable bowel syndrome with diarrhea 03/24/2013  . Herpes zoster 05/14/2005  . Abnormal Pap smear of cervix 05/15/1983  . History of cervical dysplasia 05/15/1983    Harrel Carina, MS, OTR/L 05/02/2020, 10:36 AM  [place] MAIN [facility] SERVICES [address] [place], [place], [address] Phone: [phone]   Fax:  [phone]  Name: Erika Stanton MRN: [id number] Date of Birth: 26-Feb-1956

## 2020-05-02 NOTE — Therapy (Addendum)
[place] MAIN [facility] SERVICES [address] [place], [place], [address] Phone: [phone]   Fax:  [phone]  Physical Therapy Treatment Physical Therapy Progress Note   Dates of reporting period  03/21/20  to   05/02/20   Patient Details  Name: Erika Stanton MRN: [id number] Date of Birth: 1955-12-24 Referring Provider (PT): Alger Simons, MD   Encounter Date: 05/02/2020   PT End of Session - 05/02/20 1028    Visit Number 20    Number of Visits 25    Date for PT Re-Evaluation 93/26/71   60 day cert, 30 day poc   Authorization Type [employer]BS State    PT Start Time 2458    PT Stop Time 1100    PT Time Calculation (min) 45 min    Equipment Utilized During Treatment Gait belt    Activity Tolerance Patient tolerated treatment well    Behavior During Therapy [facility] for tasks assessed/performed           Past Medical History:  Diagnosis Date  . Aneurysm ([place])   . Melena     Past Surgical History:  Procedure Laterality Date  . CHOLECYSTECTOMY    . COLONOSCOPY  11/2017   at [facility]. no recurrent polyps.  suggest repeat surveillance study 11/2022.    Marland Kitchen COLONOSCOPY W/ POLYPECTOMY  06/2014   Dr Roxy Manns at [facility].  3 adenomatoous polyps, anal fissure.    . ESOPHAGOGASTRODUODENOSCOPY (EGD) WITH PROPOFOL N/A 01/27/2020   Procedure: ESOPHAGOGASTRODUODENOSCOPY (EGD) WITH PROPOFOL;  Surgeon: Mauri Pole, MD;  Location: [facility];  Service: Endoscopy;  Laterality: N/A;  . IR 3D INDEPENDENT WKST  01/06/2020  . IR ANGIO INTRA EXTRACRAN SEL INTERNAL CAROTID BILAT MOD SED  01/06/2020  . IR ANGIO VERTEBRAL SEL VERTEBRAL UNI L MOD SED  01/06/2020  . IR ANGIOGRAM FOLLOW UP STUDY  01/06/2020  . IR ANGIOGRAM FOLLOW UP STUDY  01/06/2020  . IR ANGIOGRAM FOLLOW UP STUDY  01/06/2020  . IR ANGIOGRAM FOLLOW UP STUDY  01/06/2020  . IR ANGIOGRAM FOLLOW UP STUDY  01/06/2020  . IR ANGIOGRAM FOLLOW UP STUDY  01/06/2020  . IR ANGIOGRAM FOLLOW UP STUDY  01/06/2020  . IR  ANGIOGRAM FOLLOW UP STUDY  01/06/2020  . IR ANGIOGRAM FOLLOW UP STUDY  01/06/2020  . IR ANGIOGRAM FOLLOW UP STUDY  01/06/2020  . IR NEURO EACH ADD'L AFTER BASIC UNI RIGHT (MS)  01/06/2020  . IR TRANSCATH/EMBOLIZ  01/06/2020  . RADIOLOGY WITH ANESTHESIA N/A 01/06/2020   Procedure: IR WITH ANESTHESIA FOR ANEURYSM;  Surgeon: Consuella Lose, MD;  Location: [place];  Service: Radiology;  Laterality: N/A;  . TUBAL LIGATION      There were no vitals filed for this visit.   Subjective Assessment - 05/02/20 1019    Subjective Patient reports minimal RUE pain today. She did have CT scan done last week which was negative for acute bleed; She did follow up with neurosurgeon who felt like her coming off aspirin could have caused the change. Therefore he had her start back with the aspirin and within a few days her left hand/UE started back working. She feels like it is almost back to baseline;    Patient is accompained by: Family member    Limitations Walking;Standing;House hold activities    How long can you walk comfortably? Did a little walking yesterday up/down the hallway    Patient Stated Goals Use my right hand like I used to.    Currently in Pain?  Yes    Pain Score 2     Pain Location Shoulder    Pain Orientation Right    Pain Descriptors / Indicators Aching;Sore    Pain Type Chronic pain    Pain Onset More than a month ago    Pain Frequency Constant    Aggravating Factors  movement    Pain Relieving Factors heat/rest    Effect of Pain on Daily Activities decreased activity tolerance;    Multiple Pain Sites No    Pain Onset More than a month ago             TREATMENT: Patient hooklying on mat table: Lumbar trunk rotation x10 reps each direction Posterior pelvic tilt 5 sec hold x10 reps with min VCs for proper position and exercise technique. SLR hip flexion with posterior pelvic tilt x10 reps each LE with moderate difficulty reported; Required min VCs to keep knee extended and to  increase core stabilization;  Single leg bridges x10 reps each LE (figure 4 position) with mod VCs for keeping hips in neutral position  Suspended heel slides with core stabilization x10 reps each LE;   Patient required min-moderate verbal/tactile cues for correct exercise technique and to increase core abdominal stabilization with UE/LE movement   Seated:  Anterior/posterior pelvic tilt with diaphragmatic breathing x10 reps with min Vcs and demonstration for correct exercise technique;   Standing march with pushing down through LUE with core abdominal contraction x10 reps each LE;   Resisted walking 12.5# forward/backward, side stepping (2 way) x2 laps each with min A for safety and cues for eccentric control for better dynamic balance;   Finished with rolling stick to BLE anterior thigh to reduce tightness and reduce delayed onset muscle soreness x2 min;   Advanced HEP, see patient instructions Patient tolerated advanced exercise well. She did fatigue quickly with SLR requiring short rest break. Patient tolerated rolling stick well reporting reduction in LE tightness. She would benefit from additional skilled PT Intervention to improve strength, balance and mobility;  Goals were recently assessed on 16/38/46, see last recertification Patient's condition has the potential to improve in response to therapy. Maximum improvement is yet to be obtained. The anticipated improvement is attainable and reasonable in a generally predictable time.  Patient reports adherence with HEP and reports her LUE is improving.                         PT Education - 05/02/20 1027    Education Details core abdominal strengthening; HEP    Person(s) Educated Patient    Methods Explanation;Verbal cues    Comprehension Verbalized understanding;Returned demonstration;Verbal cues required;Need further instruction            PT Short Term Goals - 04/22/20 1957      PT SHORT TERM GOAL #1    Title STGs=LTGs             PT Long Term Goals - 04/22/20 1957      PT LONG TERM GOAL #1   Title Pt will be independent with progressive HEP for balance and strengthening to continue gains on own.    Baseline Pt reports that her PT exercises are going well and doing as instructed. Will continue to progress adding more core stabilization.    Time 4    Period Weeks    Status On-going    Target Date 05/22/20      PT LONG TERM GOAL #2   Title  Pt will increase FGA from 26/30 to 28/30 for improved balance and gait to decrease fall risk.    Baseline 03/23/20 19/30, 04/22/20 26/30    Time 4    Period Weeks    Status Revised    Target Date 05/22/20      PT LONG TERM GOAL #3   Title Pt will perform 5TSS in </=11 secs without UE support in order to indicate improved balance and functional strength    Baseline 16 seconds with L UE support 03/21/20, 04/22/20 13.54 sec without hands from mat    Time 4    Period Weeks    Status Revised    Target Date 05/22/20      PT LONG TERM GOAL #4   Title Pt will ambulate >1000' on varied surfaces independently with no LOB for improved community ambulation without AD.    Baseline 1050' including >300 feet of grass surface with SBA 03/21/20; we worked on ramp and curb also. 04/22/20 1150' mod I/supervision on varied surfaces inside due to cold.    Time 4    Period Weeks    Status On-going    Target Date 05/22/20                 Plan - 05/02/20 1253    Clinical Impression Statement Patient motivated and participated well within session. She was instructed in core strengthening exercise for better trunk control. Patient does require min VCs for proper positioning/exercise technique for optimal muscle activation. She does fatigue quickly with SLR and other challenging exercise requiring short rest breaks. Patient instructed in advanced HEP. She would benefit from additional skilled PT Intervention to improve strength, balance and mobility;     Personal Factors and Comorbidities Age;Comorbidity 1;Profession    Comorbidities B ICHs    Examination-Activity Limitations Bend;Carry;Lift;Locomotion Level;Reach Overhead;Squat;Stairs;Stand;Transfers    Examination-Participation Restrictions Cleaning;Community Activity;Driving;Occupation;Yard Work;Laundry;Meal Prep    Stability/Clinical Decision Making Evolving/Moderate complexity    Rehab Potential Good    PT Frequency 2x / week   then 1x/week for 2 weeks   PT Duration 2 weeks    PT Treatment/Interventions ADLs/Self Care Home Management;Aquatic Therapy;Electrical Stimulation;DME Instruction;Gait training;Stair training;Functional mobility training;Therapeutic activities;Therapeutic exercise;Balance training;Neuromuscular re-education;Cognitive remediation;Patient/family education;Orthotic Fit/Training;Manual techniques;Passive range of motion;Dry needling;Energy conservation;Vestibular;Visual/perceptual remediation/compensation    PT Next Visit Plan Recert was just completed for 2x/week for 2 weeks followed by 1x/week for 2 weeks for transfer to [facility]. Continue with dynamic gait and balance activities. Activities to try to incorporate more core and shoulder activation as able, LLE NMR. Pt does have chronic knee issues and is limited some due to that.    Consulted and Agree with Plan of Care Patient;Family member/caregiver           Patient will benefit from skilled therapeutic intervention in order to improve the following deficits and impairments:  Abnormal gait,Decreased activity tolerance,Decreased balance,Decreased cognition,Decreased endurance,Decreased knowledge of use of DME,Decreased mobility,Decreased strength,Impaired perceived functional ability,Impaired flexibility,Impaired sensation,Postural dysfunction,Improper body mechanics,Impaired vision/preception,Impaired UE functional use,Impaired tone  Visit Diagnosis: Muscle weakness (generalized)  Other abnormalities of gait and  mobility  Unsteadiness on feet  Other lack of coordination     Problem List Patient Active Problem List   Diagnosis Date Noted  . Dyspareunia in female 04/05/2020  . History of abnormal cervical Pap smear 03/10/2020  . GIB (gastrointestinal bleeding) 02/28/2020  . Elevated BUN   . Prediabetes   . 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 Cerebral vasospasm   . Sinus tachycardia   .  Dysphagia, post-stroke   . Thrombocytopenia ([place])   . Acute blood loss anemia   . Brain aneurysm   . ICH (intracerebral hemorrhage) ([place]) 01/05/2020  . History of adenomatous polyp of colon 02/07/2016  . 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 Fissure in ano 11/11/2014  . Hemorrhoids 11/11/2014  . Constipation 11/19/2013  . GERD (gastroesophageal reflux disease) 03/24/2013  . Irritable bowel syndrome with diarrhea 03/24/2013  . Herpes zoster 05/14/2005  . Abnormal Pap smear of cervix 05/15/1983  . History of cervical dysplasia 05/15/1983    Haadi Santellan PT, DPT 05/02/2020, 12:55 PM  [place] MAIN [facility] SERVICES [address] [place], [place], [address] Phone: [phone]   Fax:  [phone]  Name: VEE BAHE MRN: [id number] Date of Birth: 1955-07-18

## 2020-05-02 NOTE — Patient Instructions (Signed)
Access Code: [email or web address] URL: [email or web address] Date: 05/02/2020 Prepared by: Blanche East  Exercises Seated Pelvic Tilt - 1 x daily - 7 x weekly - 2 sets - 10 reps Standing March with Counter Support - 1 x daily - 7 x weekly - 2 sets - 10 reps

## 2020-05-02 NOTE — Therapy (Signed)
[place] MAIN [facility] SERVICES [address] [place], [place], [address] Phone: [phone]   Fax:  [phone]  Speech Language Pathology Treatment  Patient Details  Name: Erika Stanton MRN: [id number] Date of Birth: 01-25-1956 Referring Provider (SLP): Alger Simons, MD   Encounter Date: 05/02/2020   End of Session - 05/02/20 1216    Visit Number 15    Number of Visits 17    Date for SLP Re-Evaluation 05/27/20    Authorization - Visit Number 5    Authorization - Number of Visits 10    Progress Note Due on Visit 10    SLP Start Time 1100    SLP Stop Time  1155    SLP Time Calculation (min) 55 min    Activity Tolerance Patient tolerated treatment well           Past Medical History:  Diagnosis Date  . Aneurysm ([place])   . Melena     Past Surgical History:  Procedure Laterality Date  . CHOLECYSTECTOMY    . COLONOSCOPY  11/2017   at [facility]. no recurrent polyps.  suggest repeat surveillance study 11/2022.    Marland Kitchen COLONOSCOPY W/ POLYPECTOMY  06/2014   Dr Roxy Manns at [facility].  3 adenomatoous polyps, anal fissure.    . ESOPHAGOGASTRODUODENOSCOPY (EGD) WITH PROPOFOL N/A 01/27/2020   Procedure: ESOPHAGOGASTRODUODENOSCOPY (EGD) WITH PROPOFOL;  Surgeon: Mauri Pole, MD;  Location: [place] [facility];  Service: Endoscopy;  Laterality: N/A;  . IR 3D INDEPENDENT WKST  01/06/2020  . IR ANGIO INTRA EXTRACRAN SEL INTERNAL CAROTID BILAT MOD SED  01/06/2020  . IR ANGIO VERTEBRAL SEL VERTEBRAL UNI L MOD SED  01/06/2020  . IR ANGIOGRAM FOLLOW UP STUDY  01/06/2020  . IR ANGIOGRAM FOLLOW UP STUDY  01/06/2020  . IR ANGIOGRAM FOLLOW UP STUDY  01/06/2020  . IR ANGIOGRAM FOLLOW UP STUDY  01/06/2020  . IR ANGIOGRAM FOLLOW UP STUDY  01/06/2020  . IR ANGIOGRAM FOLLOW UP STUDY  01/06/2020  . IR ANGIOGRAM FOLLOW UP STUDY  01/06/2020  . IR ANGIOGRAM FOLLOW UP STUDY  01/06/2020  . IR ANGIOGRAM FOLLOW UP STUDY  01/06/2020  . IR ANGIOGRAM FOLLOW UP STUDY  01/06/2020  . IR  NEURO EACH ADD'L AFTER BASIC UNI RIGHT (MS)  01/06/2020  . IR TRANSCATH/EMBOLIZ  01/06/2020  . RADIOLOGY WITH ANESTHESIA N/A 01/06/2020   Procedure: IR WITH ANESTHESIA FOR ANEURYSM;  Surgeon: Consuella Lose, MD;  Location: [place];  Service: Radiology;  Laterality: N/A;  . TUBAL LIGATION      There were no vitals filed for this visit.   Subjective Assessment - 05/02/20 1205    Subjective "I did some [employer] yesterday."    Currently in Pain? Yes    Pain Score 3     Pain Location Arm                 ADULT SLP TREATMENT - 05/02/20 1100      General Information   Behavior/Cognition Alert;Cooperative;Impulsive;Pleasant mood    HPI Pt presented with headache and lt hemiplegia on 01-05-20. 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 Stent and embolization of rt ICA on 01-06-20. Pt rec'd ST on CIR for cognition and swallowing and was d/c'd on dys III/thin with supervision level cues for swallow precautions. Erika Stanton was d/c'd 02-23-20.  Treatment Provided   Treatment provided Cognitive-Linquistic      Cognitive-Linquistic Treatment   Treatment focused on Cognition    Skilled Treatment Targeted pt's attention, anticipatory awareness, and reasoning/problem solving in functional task [employer] shopping online activity). With budgeting task, patient required cues to slow down (impulsivity) and double check her calculator entries for accuracy. Had success with verbalizing data as she entered it, but cont to require cues for awareness of missed digits. Pt used her phone browser to search for information for a web "scavenger hunt," including weather app, [employer] and [employer]. Pt demo'd appropriate choice of web site/ apps to use, but required mod cues for attention to detail (price, size, shipping information) and awareness  of typing errors, impulsive clicks. Pt verbalized at end of activity that she recognized much of her frustration with her phone is due to "operator error" and that she needs to slow down.      Assessment / Recommendations / Plan   Plan Continue with current plan of care      Progression Toward Goals   Progression toward goals Progressing toward goals            SLP Education - 05/02/20 1216    Education Details using her phone is a great opportunity to practice curbing impulsivity    Person(s) Educated Patient    Methods Explanation    Comprehension Verbalized understanding            SLP Short Term Goals - 04/13/20 1137      SLP SHORT TERM GOAL #1   Title pt to complete cognitive linguistic testing    Period --   or 4 total sessions   Status Achieved      SLP SHORT TERM GOAL #2   Title pt will demo emergent awareness 100% with min complex therapy tasks x3 visits    Baseline 03-30-20;    Status Partially Met      SLP SHORT TERM GOAL #3   Title pt will demo appriopriate aspiration precuations with suggested POs x3 sessions    Status Deferred      SLP SHORT TERM GOAL #4   Title pt will solve household probelms using functional solutions x3 sessions    Baseline 03-30-20; 04-11-20    Status Achieved      SLP SHORT TERM GOAL #5   Title pt will demo selective attention (internal distraction) to a simple functional task for 10 minutes with rare min A back to task in 3 sessions    Baseline 03-28-20    Period Weeks    Status Partially Met            SLP Long Term Goals - 04/20/20 1711      SLP LONG TERM GOAL #1   Title pt will demo anticipatory awareness by double checking all work with a non-verbal cue x3 sessions    Time 3    Period Weeks   or 17 total sessions, for all LTGs   Status Revised      SLP LONG TERM GOAL #2   Title pt will follow swallow precautions with POs with modified independence x3 sessions    Status Deferred      SLP LONG TERM GOAL #3    Title pt will demo [facility] skills in solving min-mod complex problems in WNL amount of time with modified independence (double checking answers , etc)    Time 4    Period Weeks    Status Revised  SLP LONG TERM GOAL #4   Title pt will demo simple alternating attention in functional cognitive linguistic tasks in 3 sessions    Time 3    Period Weeks    Status On-going            Plan - 05/02/20 1217    Clinical Impression Statement In functional tasks today using her phone, Erika Stanton demonstrated more impulsivity than she did in structured therapy tasks last session. Pt verbalized awareness that her impulsivity was affecting her and that she would benefit from slowing down. Continue skilled ST to maximize cognition for improved independence, safety and quality of life.    Speech Therapy Frequency 2x / week    Duration 8 weeks   17 visits   Treatment/Interventions Aspiration precaution training;Pharyngeal strengthening exercises;Compensatory techniques;Diet toleration management by SLP;Trials of upgraded texture/liquids;Cueing hierarchy;Cognitive reorganization;Internal/external aids;Patient/family education;SLP instruction and feedback;Functional tasks    Potential to Achieve Goals Good           Patient will benefit from skilled therapeutic intervention in order to improve the following deficits and impairments:   Cognitive communication deficit    Problem List Patient Active Problem List   Diagnosis Date Noted  . Dyspareunia in female 04/05/2020  . History of abnormal cervical Pap smear 03/10/2020  . GIB (gastrointestinal bleeding) 02/28/2020  . Elevated BUN   . Prediabetes   . 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 Cerebral vasospasm   . Sinus tachycardia   . Dysphagia, post-stroke   . Thrombocytopenia ([place])   . Acute blood loss anemia   . Brain aneurysm   . ICH (intracerebral hemorrhage) ([place]) 01/05/2020  . History of adenomatous polyp of colon 02/07/2016  .  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 Fissure in ano 11/11/2014  . Hemorrhoids 11/11/2014  . Constipation 11/19/2013  . GERD (gastroesophageal reflux disease) 03/24/2013  . Irritable bowel syndrome with diarrhea 03/24/2013  . Herpes zoster 05/14/2005  . Abnormal Pap smear of cervix 05/15/1983  . History of cervical dysplasia 05/15/1983   [name], [place], CCC-SLP Speech-Language Pathologist  Aliene Altes 05/02/2020, 12:19 PM  [place] MAIN [facility] SERVICES [address] [place], [place], [address] Phone: [phone]   Fax:  [phone]   Name: Erika Stanton MRN: [id number] Date of Birth: 1955-12-10

## 2020-05-03 ENCOUNTER — Other Ambulatory Visit (HOSPITAL_COMMUNITY): Payer: BC Managed Care – PPO

## 2020-05-03 ENCOUNTER — Ambulatory Visit: Payer: BC Managed Care – PPO | Admitting: Podiatry

## 2020-05-04 ENCOUNTER — Ambulatory Visit: Payer: BC Managed Care – PPO | Admitting: Physical Therapy

## 2020-05-04 ENCOUNTER — Ambulatory Visit: Payer: BC Managed Care – PPO | Admitting: Speech Pathology

## 2020-05-04 ENCOUNTER — Other Ambulatory Visit: Payer: Self-pay | Admitting: Neurosurgery

## 2020-05-04 ENCOUNTER — Encounter: Payer: Self-pay | Admitting: Occupational Therapy

## 2020-05-04 ENCOUNTER — Other Ambulatory Visit: Payer: Self-pay

## 2020-05-04 ENCOUNTER — Encounter: Payer: Self-pay | Admitting: Physical Therapy

## 2020-05-04 ENCOUNTER — Other Ambulatory Visit (HOSPITAL_COMMUNITY): Payer: Self-pay | Admitting: Neurosurgery

## 2020-05-04 ENCOUNTER — Ambulatory Visit: Payer: BC Managed Care – PPO | Admitting: Occupational Therapy

## 2020-05-04 DIAGNOSIS — M6281 Muscle weakness (generalized): Secondary | ICD-10-CM

## 2020-05-04 DIAGNOSIS — R41841 Cognitive communication deficit: Secondary | ICD-10-CM

## 2020-05-04 DIAGNOSIS — I609 Nontraumatic subarachnoid hemorrhage, unspecified: Secondary | ICD-10-CM

## 2020-05-04 DIAGNOSIS — R2681 Unsteadiness on feet: Secondary | ICD-10-CM

## 2020-05-04 DIAGNOSIS — R278 Other lack of coordination: Secondary | ICD-10-CM

## 2020-05-04 DIAGNOSIS — R2689 Other abnormalities of gait and mobility: Secondary | ICD-10-CM

## 2020-05-04 NOTE — Therapy (Signed)
[place] MAIN [facility] SERVICES [address] [place], [place], [address] Phone: [phone]   Fax:  [phone]  Speech Language Pathology Treatment  Patient Details  Name: Erika Stanton MRN: [id number] Date of Birth: May 08, 1956 Referring Provider (SLP): Alger Simons, MD   Encounter Date: 05/04/2020   End of Session - 05/04/20 1339    Visit Number 16    Number of Visits 17    Date for SLP Re-Evaluation 05/27/20    Authorization - Visit Number 6    Authorization - Number of Visits 10    Progress Note Due on Visit 10    SLP Start Time 1100    SLP Stop Time  1150    SLP Time Calculation (min) 50 min    Activity Tolerance Patient tolerated treatment well           Past Medical History:  Diagnosis Date  . Aneurysm ([place])   . Melena     Past Surgical History:  Procedure Laterality Date  . CHOLECYSTECTOMY    . COLONOSCOPY  11/2017   at [facility]. no recurrent polyps.  suggest repeat surveillance study 11/2022.    Marland Kitchen COLONOSCOPY W/ POLYPECTOMY  06/2014   Dr Roxy Manns at [facility].  3 adenomatoous polyps, anal fissure.    . ESOPHAGOGASTRODUODENOSCOPY (EGD) WITH PROPOFOL N/A 01/27/2020   Procedure: ESOPHAGOGASTRODUODENOSCOPY (EGD) WITH PROPOFOL;  Surgeon: Mauri Pole, MD;  Location: [place] [facility];  Service: Endoscopy;  Laterality: N/A;  . IR 3D INDEPENDENT WKST  01/06/2020  . IR ANGIO INTRA EXTRACRAN SEL INTERNAL CAROTID BILAT MOD SED  01/06/2020  . IR ANGIO VERTEBRAL SEL VERTEBRAL UNI L MOD SED  01/06/2020  . IR ANGIOGRAM FOLLOW UP STUDY  01/06/2020  . IR ANGIOGRAM FOLLOW UP STUDY  01/06/2020  . IR ANGIOGRAM FOLLOW UP STUDY  01/06/2020  . IR ANGIOGRAM FOLLOW UP STUDY  01/06/2020  . IR ANGIOGRAM FOLLOW UP STUDY  01/06/2020  . IR ANGIOGRAM FOLLOW UP STUDY  01/06/2020  . IR ANGIOGRAM FOLLOW UP STUDY  01/06/2020  . IR ANGIOGRAM FOLLOW UP STUDY  01/06/2020  . IR ANGIOGRAM FOLLOW UP STUDY  01/06/2020  . IR ANGIOGRAM FOLLOW UP STUDY  01/06/2020  . IR  NEURO EACH ADD'L AFTER BASIC UNI RIGHT (MS)  01/06/2020  . IR TRANSCATH/EMBOLIZ  01/06/2020  . RADIOLOGY WITH ANESTHESIA N/A 01/06/2020   Procedure: IR WITH ANESTHESIA FOR ANEURYSM;  Surgeon: Consuella Lose, MD;  Location: [place];  Service: Radiology;  Laterality: N/A;  . TUBAL LIGATION      There were no vitals filed for this visit.   Subjective Assessment - 05/04/20 1328    Subjective "You're my last."    Currently in Pain? No/denies                 ADULT SLP TREATMENT - 05/04/20 1100      General Information   Behavior/Cognition Alert;Cooperative;Impulsive;Pleasant mood    HPI Pt presented with headache and lt hemiplegia on 01-05-20. 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 Stent and embolization of rt ICA on 01-06-20. Pt rec'd ST on CIR for cognition and swallowing and was d/c'd on dys III/thin with supervision level cues for swallow precautions. [place] was d/c'd 02-23-20.      Treatment Provided   Treatment provided Cognitive-Linquistic  Pain Assessment   Pain Assessment No/denies pain      Cognitive-Linquistic Treatment   Treatment focused on Cognition    Skilled Treatment Patient reported problems "paying attention" to her husband when he asks her to do something. With further questioning, discerned that this appears to be related to motivation (ie: pt comprehending but CHOOSING not to follow through) vs attention/recall. We discussed how use of a routine and reminders may help pt to complete the activities she knows "I need to do" such as PT and OT exercises. Pt is to have husband assist her in scheduling them on their shared calendar. She did not have her phone today, but stated she would set an alert (she had done this with prior therapist but turned off as it was conflicting with her therapy  schedule). Targeted alternating attention/awareness in decoding task: pt impulsivity impacted accuracy; she did eventually review and note errors, but added to vs replacing her responses, and she was unable to read these later, causing her to re-correct  or lose her place  when responses were illegible. Mod cues for problem solving how to make corrections in a different way to reduce errors. Verbal problem solving :  90% accuracy with mod cues for task persistence, problem solving and use of strategy. Pt required extended time and several repetitions. Despite requiring multiple repetitions each time, patient did not write down any notes unless she was directed to do so. For goal setting, most of pt complaints are related to dexterity vs cognition. Encouraged pt to have her spouse join for next session to help determine functional goals for pt.      Assessment / Recommendations / Plan   Plan Continue with current plan of care      Progression Toward Goals   Progression toward goals Progressing toward goals            SLP Education - 05/04/20 1339    Education Details spouse should join next session if able    Person(s) Educated Patient    Methods Explanation    Comprehension Verbalized understanding            SLP Short Term Goals - 04/13/20 1137      SLP SHORT TERM GOAL #1   Title pt to complete cognitive linguistic testing    Period --   or 4 total sessions   Status Achieved      SLP SHORT TERM GOAL #2   Title pt will demo emergent awareness 100% with min complex therapy tasks x3 visits    Baseline 03-30-20;    Status Partially Met      SLP SHORT TERM GOAL #3   Title pt will demo appriopriate aspiration precuations with suggested POs x3 sessions    Status Deferred      SLP SHORT TERM GOAL #4   Title pt will solve household probelms using functional solutions x3 sessions    Baseline 03-30-20; 04-11-20    Status Achieved      SLP SHORT TERM GOAL #5   Title pt will demo  selective attention (internal distraction) to a simple functional task for 10 minutes with rare min A back to task in 3 sessions    Baseline 03-28-20    Period Weeks    Status Partially Met            SLP Long Term Goals - 04/20/20 1711      SLP LONG TERM GOAL #1   Title pt will demo anticipatory awareness  by double checking all work with a non-verbal cue x3 sessions    Time 3    Period Weeks   or 17 total sessions, for all LTGs   Status Revised      SLP LONG TERM GOAL #2   Title pt will follow swallow precautions with POs with modified independence x3 sessions    Status Deferred      SLP LONG TERM GOAL #3   Title pt will demo [facility] skills in solving min-mod complex problems in WNL amount of time with modified independence (double checking answers , etc)    Time 4    Period Weeks    Status Revised      SLP LONG TERM GOAL #4   Title pt will demo simple alternating attention in functional cognitive linguistic tasks in 3 sessions    Time 3    Period Weeks    Status On-going            Plan - 05/04/20 1340    Clinical Impression Statement Impulsivity and diminished awareness continue to impact Erika Stanton functionally. Erika Stanton continues to work diligently on online therapy activities at home; she remains motivated for improvement. Continue skilled ST to maximize cognition for improved independence, safety and quality of life.    Speech Therapy Frequency 2x / week    Duration 8 weeks   17 visits   Treatment/Interventions Aspiration precaution training;Pharyngeal strengthening exercises;Compensatory techniques;Diet toleration management by SLP;Trials of upgraded texture/liquids;Cueing hierarchy;Cognitive reorganization;Internal/external aids;Patient/family education;SLP instruction and feedback;Functional tasks    Potential to Achieve Goals Good           Patient will benefit from skilled therapeutic intervention in order to improve the following deficits and impairments:    Cognitive communication deficit    Problem List Patient Active Problem List   Diagnosis Date Noted  . Dyspareunia in female 04/05/2020  . History of abnormal cervical Pap smear 03/10/2020  . GIB (gastrointestinal bleeding) 02/28/2020  . Elevated BUN   . Prediabetes   . 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[place]) 01/20/2020  . Cerebral vasospasm   . Sinus tachycardia   . Dysphagia, post-stroke   . Thrombocytopenia ([place])   . Acute blood loss anemia   . Brain aneurysm   . ICH (intracerebral hemorrhage) ([place]) 01/05/2020  . History of adenomatous polyp of colon 02/07/2016  . 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 Fissure in ano 11/11/2014  . Hemorrhoids 11/11/2014  . Constipation 11/19/2013  . GERD (gastroesophageal reflux disease) 03/24/2013  . Irritable bowel syndrome with diarrhea 03/24/2013  . Herpes zoster 05/14/2005  . Abnormal Pap smear of cervix 05/15/1983  . History of cervical dysplasia 05/15/1983   [name], [place], CCC-SLP Speech-Language Pathologist  Aliene Altes 05/04/2020, 1:42 PM  [place] MAIN [facility] SERVICES [address] [place], [place], [address] Phone: [phone]   Fax:  [phone]   Name: Erika Stanton MRN: [id number] Date of Birth: 1955/09/23

## 2020-05-04 NOTE — Therapy (Signed)
[place] MAIN [facility] SERVICES [address] [place], [place], [address] Phone: [phone]   Fax:  [phone]  Physical Therapy Treatment  Patient Details  Name: Erika Stanton MRN: [id number] Date of Birth: [birth year] Referring Provider (PT): Alger Simons, MD   Encounter Date: 05/04/2020   PT End of Session - 05/04/20 1024    Visit Number 21    Number of Visits 25    Date for PT Re-Evaluation 0000000   60 day cert, 30 day poc   Authorization Type [employer] State    PT Start Time 1016    PT Stop Time 1100    PT Time Calculation (min) 44 min    Equipment Utilized During Treatment Gait belt    Activity Tolerance Patient tolerated treatment well    Behavior During Therapy [facility] for tasks assessed/performed           Past Medical History:  Diagnosis Date  . Aneurysm ([place])   . Melena     Past Surgical History:  Procedure Laterality Date  . CHOLECYSTECTOMY    . COLONOSCOPY  11/2017   at [facility]. no recurrent polyps.  suggest repeat surveillance study 11/2022.    Marland Kitchen COLONOSCOPY W/ POLYPECTOMY  06/2014   Dr Roxy Manns at [facility].  3 adenomatoous polyps, anal fissure.    . ESOPHAGOGASTRODUODENOSCOPY (EGD) WITH PROPOFOL N/A 01/27/2020   Procedure: ESOPHAGOGASTRODUODENOSCOPY (EGD) WITH PROPOFOL;  Surgeon: Mauri Pole, MD;  Location: [place] [facility];  Service: Endoscopy;  Laterality: N/A;  . IR 3D INDEPENDENT WKST  01/06/2020  . IR ANGIO INTRA EXTRACRAN SEL INTERNAL CAROTID BILAT MOD SED  01/06/2020  . IR ANGIO VERTEBRAL SEL VERTEBRAL UNI L MOD SED  01/06/2020  . IR ANGIOGRAM FOLLOW UP STUDY  01/06/2020  . IR ANGIOGRAM FOLLOW UP STUDY  01/06/2020  . IR ANGIOGRAM FOLLOW UP STUDY  01/06/2020  . IR ANGIOGRAM FOLLOW UP STUDY  01/06/2020  . IR ANGIOGRAM FOLLOW UP STUDY  01/06/2020  . IR ANGIOGRAM FOLLOW UP STUDY  01/06/2020  . IR ANGIOGRAM FOLLOW UP STUDY  01/06/2020  . IR ANGIOGRAM FOLLOW UP STUDY  01/06/2020  . IR ANGIOGRAM FOLLOW UP STUDY  01/06/2020  . IR  ANGIOGRAM FOLLOW UP STUDY  01/06/2020  . IR NEURO EACH ADD'L AFTER BASIC UNI RIGHT (MS)  01/06/2020  . IR TRANSCATH/EMBOLIZ  01/06/2020  . RADIOLOGY WITH ANESTHESIA N/A 01/06/2020   Procedure: IR WITH ANESTHESIA FOR ANEURYSM;  Surgeon: Consuella Lose, MD;  Location: [place];  Service: Radiology;  Laterality: N/A;  . TUBAL LIGATION      There were no vitals filed for this visit.   Subjective Assessment - 05/04/20 1023    Subjective Patient reports doing well. She denies any pain after last session. She reports working on the standing marches at home.    Patient is accompained by: Family member    Limitations Walking;Standing;House hold activities    How long can you walk comfortably? Did a little walking yesterday up/down the hallway    Patient Stated Goals Use my right hand like I used to.    Currently in Pain? No/denies    Pain Onset More than a month ago    Pain Onset More than a month ago                 TREATMENT: Ex:  Patient hooklying on mat table: Lumbar trunk rotation x10 reps each direction Posterior pelvic tilt 5 sec hold x10 reps with min VCs for proper  position and exercise technique. SLR hip flexion with posterior pelvic tilt x12 reps each LE with moderate difficulty reported; Required min VCs to keep knee extended and to increase core stabilization;  Single leg bridges x12 reps each LE (figure 4 position) with mod VCs for keeping hips in neutral position  Suspended heel slides with core stabilization x12 reps each LE;  Bridges with alternate march to challenge weight shift and trunk stability and strengthening 2x5 reps each LE;   Patient required min-moderate verbal/tactile cues for correct exercise technique and to increase core abdominal stabilization with UE/LE movement  NMR:  Standing on airex: -alternate toe taps to 8 inch step x15 reps each LE with 1-0 rail assist -standing one foot on airex, one foot on 8 inch step:  Unsupported standing 15 sec hold  each LE  Unsupported standing with head turns side/side x5 reps each  Required min-mod A for  Balance and stance control with increased unsteadiness noted while standing on LLE;    Resisted walking 12.5# forward/backward, side stepping (2 way) x2 laps each with min A for safety and cues for eccentric control for better dynamic balance;   Standing:  BLE heel raises unsupported standing 3 sec hold x10 reps with min A for safety;  Attempted single leg heel raise but patient unable;   Advanced HEP, see patient instructions Patient tolerated advanced exercise well. She did fatigue quickly with SLR requiring short rest break. She had some difficulty with standing on compliant surface with reduced rail assist with challenge to stance control;  She would benefit from additional skilled PT Intervention to improve strength, balance and mobility;                        PT Education - 05/04/20 1024    Education Details core abdominal strengthening, HEP    Person(s) Educated Patient    Methods Explanation;Verbal cues    Comprehension Verbalized understanding;Returned demonstration;Verbal cues required;Need further instruction            PT Short Term Goals - 04/22/20 1957      PT SHORT TERM GOAL #1   Title STGs=LTGs             PT Long Term Goals - 04/22/20 1957      PT LONG TERM GOAL #1   Title Pt will be independent with progressive HEP for balance and strengthening to continue gains on own.    Baseline Pt reports that her PT exercises are going well and doing as instructed. Will continue to progress adding more core stabilization.    Time 4    Period Weeks    Status On-going    Target Date 05/22/20      PT LONG TERM GOAL #2   Title Pt will increase FGA from 26/30 to 28/30 for improved balance and gait to decrease fall risk.    Baseline 03/23/20 19/30, 04/22/20 26/30    Time 4    Period Weeks    Status Revised    Target Date 05/22/20      PT LONG TERM  GOAL #3   Title Pt will perform 5TSS in </=11 secs without UE support in order to indicate improved balance and functional strength    Baseline 16 seconds with L UE support 03/21/20, 04/22/20 13.54 sec without hands from mat    Time 4    Period Weeks    Status Revised    Target Date 05/22/20  PT LONG TERM GOAL #4   Title Pt will ambulate >1000' on varied surfaces independently with no LOB for improved community ambulation without AD.    Baseline 1050' including >300 feet of grass surface with SBA 03/21/20; we worked on ramp and curb also. 04/22/20 1150' mod I/supervision on varied surfaces inside due to cold.    Time 4    Period Weeks    Status On-going    Target Date 05/22/20                 Plan - 05/04/20 1103    Clinical Impression Statement Patient motivated and participated well within session. She was instructed in advanced core strengthening and proximal hip strengthening for better stance control. Patient does require min VCs for proper exercise technique. Patient instructed in advanced balance exercise utilizing compliant surface to challenge stance control. She does exhibit increased difficulty with LLE stance activities with increased lateral loss of balance especially with reduced HHA, requiring min A for safety. Patient would benefit from additional skilled PT Intervention to improve strength, balance and mobility;    Personal Factors and Comorbidities Age;Comorbidity 1;Profession    Comorbidities B ICHs    Examination-Activity Limitations Bend;Carry;Lift;Locomotion Level;Reach Overhead;Squat;Stairs;Stand;Transfers    Examination-Participation Restrictions Cleaning;Community Activity;Driving;Occupation;Yard Work;Laundry;Meal Prep    Stability/Clinical Decision Making Evolving/Moderate complexity    Rehab Potential Good    PT Frequency 2x / week   then 1x/week for 2 weeks   PT Duration 2 weeks    PT Treatment/Interventions ADLs/Self Care Home Management;Aquatic  Therapy;Electrical Stimulation;DME Instruction;Gait training;Stair training;Functional mobility training;Therapeutic activities;Therapeutic exercise;Balance training;Neuromuscular re-education;Cognitive remediation;Patient/family education;Orthotic Fit/Training;Manual techniques;Passive range of motion;Dry needling;Energy conservation;Vestibular;Visual/perceptual remediation/compensation    PT Next Visit Plan Recert was just completed for 2x/week for 2 weeks followed by 1x/week for 2 weeks for transfer to [facility]. Continue with dynamic gait and balance activities. Activities to try to incorporate more core and shoulder activation as able, LLE NMR. Pt does have chronic knee issues and is limited some due to that.    Consulted and Agree with Plan of Care Patient;Family member/caregiver           Patient will benefit from skilled therapeutic intervention in order to improve the following deficits and impairments:  Abnormal gait,Decreased activity tolerance,Decreased balance,Decreased cognition,Decreased endurance,Decreased knowledge of use of DME,Decreased mobility,Decreased strength,Impaired perceived functional ability,Impaired flexibility,Impaired sensation,Postural dysfunction,Improper body mechanics,Impaired vision/preception,Impaired UE functional use,Impaired tone  Visit Diagnosis: Muscle weakness (generalized)  Other abnormalities of gait and mobility  Unsteadiness on feet  Other lack of coordination     Problem List Patient Active Problem List   Diagnosis Date Noted  . Dyspareunia in female 04/05/2020  . History of abnormal cervical Pap smear 03/10/2020  . GIB (gastrointestinal bleeding) 02/28/2020  . Elevated BUN   . Prediabetes   . 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[place]) 01/20/2020  . Cerebral vasospasm   . Sinus tachycardia   . Dysphagia, post-stroke   . Thrombocytopenia ([place])   . Acute blood loss anemia   . Brain aneurysm   . ICH (intracerebral hemorrhage) ([place]) 01/05/2020  .  History of adenomatous polyp of colon 02/07/2016  . 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 Fissure in ano 11/11/2014  . Hemorrhoids 11/11/2014  . Constipation 11/19/2013  . GERD (gastroesophageal reflux disease) 03/24/2013  . Irritable bowel syndrome with diarrhea 03/24/2013  . Herpes zoster 05/14/2005  . Abnormal Pap smear of cervix 05/15/1983  . History of cervical dysplasia 05/15/1983    September Mormile PT, DPT 05/04/2020, 11:05 AM  [facility]  [place] MAIN [facility] SERVICES [address] [place], [place], [address] Phone: [phone]   Fax:  [phone]  Name: Erika Stanton MRN: [id number] Date of Birth: [birth year]

## 2020-05-04 NOTE — Therapy (Signed)
[place] MAIN [facility] SERVICES [address] [place], [place], [address] Phone: [phone]   Fax:  [phone]  Occupational Therapy Treatment  Patient Details  Name: ERCIA CRISAFULLI MRN: [id number] Date of Birth: [birth year] Referring Provider (OT): Dr. Naaman Plummer   Encounter Date: 05/04/2020   OT End of Session - 05/04/20 2102    Visit Number 18    Number of Visits 26    Date for OT Re-Evaluation 06/08/20    Authorization Time [place]    OT Start Time [phone]    OT Stop Time 1015    OT Time Calculation (min) 42 min    Activity Tolerance Patient tolerated treatment well    Behavior During Therapy [facility] for tasks assessed/performed           Past Medical History:  Diagnosis Date  . Aneurysm ([place])   . Melena     Past Surgical History:  Procedure Laterality Date  . CHOLECYSTECTOMY    . COLONOSCOPY  11/2017   at [facility]. no recurrent polyps.  suggest repeat surveillance study 11/2022.    Marland Kitchen COLONOSCOPY W/ POLYPECTOMY  06/2014   Dr Roxy Manns at [facility].  3 adenomatoous polyps, anal fissure.    . ESOPHAGOGASTRODUODENOSCOPY (EGD) WITH PROPOFOL N/A 01/27/2020   Procedure: ESOPHAGOGASTRODUODENOSCOPY (EGD) WITH PROPOFOL;  Surgeon: Mauri Pole, MD;  Location: [place] ENDOSCOPY;  Service: Endoscopy;  Laterality: N/A;  . IR 3D INDEPENDENT WKST  01/06/2020  . IR ANGIO INTRA EXTRACRAN SEL INTERNAL CAROTID BILAT MOD SED  01/06/2020  . IR ANGIO VERTEBRAL SEL VERTEBRAL UNI L MOD SED  01/06/2020  . IR ANGIOGRAM FOLLOW UP STUDY  01/06/2020  . IR ANGIOGRAM FOLLOW UP STUDY  01/06/2020  . IR ANGIOGRAM FOLLOW UP STUDY  01/06/2020  . IR ANGIOGRAM FOLLOW UP STUDY  01/06/2020  . IR ANGIOGRAM FOLLOW UP STUDY  01/06/2020  . IR ANGIOGRAM FOLLOW UP STUDY  01/06/2020  . IR ANGIOGRAM FOLLOW UP STUDY  01/06/2020  . IR ANGIOGRAM FOLLOW UP STUDY  01/06/2020  . IR ANGIOGRAM FOLLOW UP STUDY  01/06/2020  . IR ANGIOGRAM FOLLOW UP STUDY  01/06/2020  . IR NEURO EACH ADD'L AFTER BASIC  UNI RIGHT (MS)  01/06/2020  . IR TRANSCATH/EMBOLIZ  01/06/2020  . RADIOLOGY WITH ANESTHESIA N/A 01/06/2020   Procedure: IR WITH ANESTHESIA FOR ANEURYSM;  Surgeon: Consuella Lose, MD;  Location: [place];  Service: Radiology;  Laterality: N/A;  . TUBAL LIGATION      There were no vitals filed for this visit.   Subjective Assessment - 05/04/20 2057    Subjective  Pt. reports doing well today.    Currently in Pain? Yes    Pain Score 4     Pain Location Shoulder    Pain Orientation Right    Pain Descriptors / Indicators Aching    Pain Type Chronic pain    Pain Onset More than a month ago    Pain Score 4    Pain Location Hand    Pain Orientation Right    Pain Descriptors / Indicators Aching    Pain Type Chronic pain    Pain Onset More than a month ago              [facility] OT Assessment - 05/04/20 0001      Coordination   Left 9 Hole Peg Test 45 sec.      Hand Function   Left Hand Grip (lbs) 8    Left Hand Lateral  Pinch 5 lbs    Left 3 point pinch 7 lbs          OT TREATMENT    Therapeutic Exercise:  Pt. performed tolerated minimal shoulder abduction, and flexion with 2-4/10 pain. Pt. was able to tolerate AAROM for elbow flexion, elbow extension with resistance, AAROM supination with PROM to the end range.  PROM for right wrist, digit MP, PIP, and DIP flexion, and extension. Pt. Tolerated moist heat modality to the right shoulder, and right prior to ROM. Pt., and husband education was provided about ROM, and self ROM. Measurements were obtained for left grip strength, pinch strength, and [place].  Manual Therapy:  Pt. tolerated right scapular mobes in sitting for elevation, depression, abduction/rotation. Mobilizations were performed prior to, independent of, and in preparation for ROM.    Pt.'s husband was present for the beginning of the session today. Pt.'s husband expressed concern about the progress with her RUE, and hand. Pt./caregiver education was provided about  ROM, and self-ROM. Pt. plans to bring in her splints next week to reassess splint fit. Pt. Tolerated PROM in the right shoulder, wrist, and fingers. Pt.'s ROM continues to be limited by pain. Pt. Reports 2-4/10 pain in the right shoulder, digits, and thumb.  Pt. was able to achieve increased extension with PROM, and self-ROM in the right digit MP, PIP, and DIPs, and thumb palmar abduction, as well as radial abduction with decreased pain following slow gentle ROM. Pt. reported pain initially, however did improve with ROM. Pt. Is able to initiate active right thumb, and 2nd digit movement to initiate formulating a lateral grasp pattern.                   OT Education - 05/04/20 2102    Education Details RUE ROM, Left grip, and pinch measurements.    Person(s) Educated Patient    Methods Explanation;Demonstration;Verbal cues;Handout    Comprehension Verbalized understanding;Returned demonstration;Verbal cues required            OT Short Term Goals - 04/13/20 1031      OT SHORT TERM GOAL #1   Title I with initial HEP.    Time --    Period Weeks    Status Achieved    Target Date 04/13/20      OT SHORT TERM GOAL #2   Title Pt will perform bathing with min A.    Time --    Period Weeks    Status Achieved   Pt reports showering with min A     OT SHORT TERM GOAL #3   Title Pt will donn shirt and pants with min A    Time 4    Period Weeks    Status On-going   mod A     OT SHORT TERM GOAL #4   Title Pt will demonstrate 30 A/ROM shoulder flexion for RUE with pain less than or equal to 4/10.    Time 4    Period Weeks    Status On-going      OT SHORT TERM GOAL #5   Title Pt will  demonstrate improved LUE fine motor coordination for ADLs as evidenced by decreasing 9 hole peg test score to 45 secs or less.    Time --    Period Weeks    Status Achieved   42.34     OT SHORT TERM GOAL #6   Title Pt/ caregiver will verbalize understanding of RUE positioning and edema  control techniques to minimize  pain and risk for injury    Time 4    Period Weeks    Status On-going   pt uses a pillow, she has splint, needs reinforcement     OT SHORT TERM GOAL #7   Title Pt will demonstrate at least 25% finger flexion/ extension in RUE in prep for functional use.    Time 4    Period Weeks    Status On-going   grossly 20%, not consistent            OT Long Term Goals - 04/13/20 1122      OT LONG TERM GOAL #1   Title Pt will perfrom  all basic ADLS with supervision    Time 8    Period Weeks    Status On-going      OT LONG TERM GOAL #2   Title Pt will demonstrate 45* RUE shoulder flexion in prep for functional reach with pain less than or equal to 3/10.    Time 8    Period Weeks    Status On-going      OT LONG TERM GOAL #3   Title Pt will use RUE as a stabilizer/ gross A at least 30 % of the time for ADLs/ IADLs.    Period Weeks    Status On-going      OT LONG TERM GOAL #4   Title Pt will increase LUE grip strength to 25 lbs or greater for increased ease with ADLs.    Time 8    Period Weeks    Status On-going      OT LONG TERM GOAL #5   Title Pt will perform simple home management/ snack prep with min A.    Time 8    Period Weeks    Status On-going      OT LONG TERM GOAL #6   Title Pt will demonstrate ability to grasp/ release a cup 2/3 trials with RUE.    Time 8    Period Weeks    Status On-going      OT LONG TERM GOAL #7   Title Pt will safely navigate a busy environment and locate items with supervision and  90% or greater accuracy without bumping into items.    Time 8    Period Weeks    Status On-going      OT LONG TERM GOAL #8   Title I with updated HEP.    Time 8    Period Weeks    Status On-going                 Plan - 05/04/20 2103    Clinical Impression Statement Pt.'s husband was present for the beginning of the session today. Pt.'s husband expressed concern about the progress with her RUE, and hand. Pt./caregiver  education was provided about ROM, and self-ROM. Pt. plans to bring in her splints next week to reassess splint fit. Pt. Tolerated PROM in the right shoulder, wrist, and fingers. Pt.'s ROM continues to be limited by pain. Pt. Reports 2-4/10 pain in the right shoulder, digits, and thumb.  Pt. was able to achieve increased extension with PROM, and self-ROM in the right digit MP, PIP, and DIPs, and thumb palmar abduction, as well as radial abduction with decreased pain following slow gentle ROM. Pt. reported pain initially, however did improve with ROM. Pt. Is able to initiate active right thumb, and 2nd digit movement to initiate formulating a lateral grasp pattern.   OT Occupational Profile  and History Detailed Assessment- Review of Records and additional review of physical, cognitive, psychosocial history related to current functional performance    Occupational performance deficits (Please refer to evaluation for details): ADL's;IADL's;Rest and Sleep;Work;Play;Leisure    Body Structure / Function / Physical Skills ADL;Balance;Coordination;Decreased knowledge of precautions;Decreased knowledge of use of DME;Dexterity;Edema;Mobility;Tone;Strength;IADL;Sensation;GMC;Gait;ROM;FMC;Flexibility;Pain;Vision;UE functional use;Endurance    Cognitive Skills Attention;Perception;Problem Solve;Safety Awareness;Thought    Rehab Potential Good    Clinical Decision Making Several treatment options, min-mod task modification necessary    Comorbidities Affecting Occupational Performance: None    Modification or Assistance to Complete Evaluation  Min-Moderate modification of tasks or assist with assess necessary to complete eval    OT Frequency 3x / week    OT Duration 8 weeks    OT Treatment/Interventions Self-care/ADL training;Ultrasound;Energy conservation;Visual/perceptual remediation/compensation;Patient/family education;DME and/or AE instruction;Aquatic Therapy;Paraffin;Gait Training;Passive range of motion;Balance  training;Fluidtherapy;Electrical Stimulation;Functional Mobility Training;Splinting;Moist Heat;Therapeutic exercise;Manual Therapy;Cognitive remediation/compensation;Manual lymph drainage;Neuromuscular education;Coping strategies training    Consulted and Agree with Plan of Care Patient;Family member/caregiver           Patient will benefit from skilled therapeutic intervention in order to improve the following deficits and impairments:   Body Structure / Function / Physical Skills: ADL,Balance,Coordination,Decreased knowledge of precautions,Decreased knowledge of use of DME,Dexterity,Edema,Mobility,Tone,Strength,IADL,Sensation,GMC,Gait,ROM,FMC,Flexibility,Pain,Vision,UE functional use,Endurance Cognitive Skills: Attention,Perception,Problem Solve,Safety Awareness,Thought     Visit Diagnosis: Muscle weakness (generalized)  Other lack of coordination    Problem List Patient Active Problem List   Diagnosis Date Noted  . Dyspareunia in female 04/05/2020  . History of abnormal cervical Pap smear 03/10/2020  . GIB (gastrointestinal bleeding) 02/28/2020  . Elevated BUN   . Prediabetes   . 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 Cerebral vasospasm   . Sinus tachycardia   . Dysphagia, post-stroke   . Thrombocytopenia ([place])   . Acute blood loss anemia   . Brain aneurysm   . ICH (intracerebral hemorrhage) ([place]) 01/05/2020  . History of adenomatous polyp of colon 02/07/2016  . 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 Fissure in ano 11/11/2014  . Hemorrhoids 11/11/2014  . Constipation 11/19/2013  . GERD (gastroesophageal reflux disease) 03/24/2013  . Irritable bowel syndrome with diarrhea 03/24/2013  . Herpes zoster 05/14/2005  . Abnormal Pap smear of cervix 05/15/1983  . History of cervical dysplasia 05/15/1983    Harrel Carina, MS, OTR/L 05/04/2020, 9:08 PM  [place] MAIN [facility] SERVICES [address] [place],  [place], [address] Phone: [phone]   Fax:  [phone]  Name: YILDA BANDEMER MRN: [id number] Date of Birth: July 18, 1955

## 2020-05-04 NOTE — Patient Instructions (Signed)
Access Code: [facility] URL: [email or web address] Date: 05/04/2020 Prepared by: Blanche East  Exercises Heel rises with counter support - 1 x daily - 7 x weekly - 1 sets - 10 reps - 3 sec hold

## 2020-05-05 ENCOUNTER — Encounter: Payer: Self-pay | Admitting: Occupational Therapy

## 2020-05-05 ENCOUNTER — Ambulatory Visit: Payer: BC Managed Care – PPO | Admitting: Occupational Therapy

## 2020-05-05 ENCOUNTER — Other Ambulatory Visit: Payer: Self-pay

## 2020-05-05 DIAGNOSIS — M6281 Muscle weakness (generalized): Secondary | ICD-10-CM

## 2020-05-05 DIAGNOSIS — R2681 Unsteadiness on feet: Secondary | ICD-10-CM

## 2020-05-05 DIAGNOSIS — R278 Other lack of coordination: Secondary | ICD-10-CM

## 2020-05-05 NOTE — Therapy (Signed)
[place] MAIN [facility] SERVICES [address] [place], [place], [address] Phone: [phone]   Fax:  [phone]  Occupational Therapy Treatment  Patient Details  Name: Erika Stanton MRN: [id number] Date of Birth: 06/13/1955 Referring Provider (OT): Dr. Naaman Plummer   Encounter Date: 05/05/2020   OT End of Session - 05/05/20 1029    Visit Number 19    Number of Visits 26    Date for OT Re-Evaluation 06/08/20    Authorization Time [place]    OT Start Time 1015    OT Stop Time 1105    OT Time Calculation (min) 50 min    Activity Tolerance Patient tolerated treatment well    Behavior During Therapy [facility] for tasks assessed/performed           Past Medical History:  Diagnosis Date  . Aneurysm ([place])   . Melena     Past Surgical History:  Procedure Laterality Date  . CHOLECYSTECTOMY    . COLONOSCOPY  11/2017   at [facility]. no recurrent polyps.  suggest repeat surveillance study 11/2022.    Marland Kitchen COLONOSCOPY W/ POLYPECTOMY  06/2014   Dr Roxy Manns at [facility].  3 adenomatoous polyps, anal fissure.    . ESOPHAGOGASTRODUODENOSCOPY (EGD) WITH PROPOFOL N/A 01/27/2020   Procedure: ESOPHAGOGASTRODUODENOSCOPY (EGD) WITH PROPOFOL;  Surgeon: Mauri Pole, MD;  Location: [place] ENDOSCOPY;  Service: Endoscopy;  Laterality: N/A;  . IR 3D INDEPENDENT WKST  01/06/2020  . IR ANGIO INTRA EXTRACRAN SEL INTERNAL CAROTID BILAT MOD SED  01/06/2020  . IR ANGIO VERTEBRAL SEL VERTEBRAL UNI L MOD SED  01/06/2020  . IR ANGIOGRAM FOLLOW UP STUDY  01/06/2020  . IR ANGIOGRAM FOLLOW UP STUDY  01/06/2020  . IR ANGIOGRAM FOLLOW UP STUDY  01/06/2020  . IR ANGIOGRAM FOLLOW UP STUDY  01/06/2020  . IR ANGIOGRAM FOLLOW UP STUDY  01/06/2020  . IR ANGIOGRAM FOLLOW UP STUDY  01/06/2020  . IR ANGIOGRAM FOLLOW UP STUDY  01/06/2020  . IR ANGIOGRAM FOLLOW UP STUDY  01/06/2020  . IR ANGIOGRAM FOLLOW UP STUDY  01/06/2020  . IR ANGIOGRAM FOLLOW UP STUDY  01/06/2020  . IR NEURO EACH ADD'L AFTER BASIC  UNI RIGHT (MS)  01/06/2020  . IR TRANSCATH/EMBOLIZ  01/06/2020  . RADIOLOGY WITH ANESTHESIA N/A 01/06/2020   Procedure: IR WITH ANESTHESIA FOR ANEURYSM;  Surgeon: Consuella Lose, MD;  Location: [place];  Service: Radiology;  Laterality: N/A;  . TUBAL LIGATION      There were no vitals filed for this visit.   Subjective Assessment - 05/05/20 1028    Subjective  Patient reports her right shoulder is hurting some today and hand. "Heat usually makes it feel better."    Pertinent History ICH 01/06/20, Aneurysm. PMH: OA bilateral knees and hands    Patient Stated Goals Pt would like to be as independent as possible.    Currently in Pain? Yes    Pain Score 2     Pain Location Shoulder    Pain Orientation Right    Pain Descriptors / Indicators Aching    Pain Onset More than a month ago    Pain Frequency Constant    Multiple Pain Sites No           Moist heat applied to right shoulder prior to ROM and exercises for 5 mins to prepare arm for ROM, decrease pain.   Scapular mobs in sitting for scapular retraction, protraction, elevation followed by [facility] for each exercise, manual assist  from therapist for assisted motions.    Gentle ROM for forwards reaching patterns, patient tends to guard heavily on right UE, slightly resistive to movement and has a hard time relaxing to allow therapist to assist with movement.  Pt tends to keep her forearm in a supinated position, instructed on moving more into pronation at times to avoid activating bicep so much.  Pt requires guiding into pronation and highly guarded with this movement as well.  PROM to right digits for extension, tightness noted and patient instructed on frequent finger extension both active and passive motion.  Instructed on towel guiding exercises for tabletop.    Response to tx:   Patient with pain in right shoulder and heavily guarding the arm from movement.  Tends to keep arm in supination and with guarding she is likely activating the  bicep most of the time.  Once we were able to move more she felt better and more relaxed into allowing therapist to assist with movements.  Encouraged patient to work on ROM at home throughout the day over the next week since she is at risk now for contractures.  She demonstrates understanding, will follow up next session to see how exercises went at home.                       OT Education - 05/05/20 1029    Education Details ROM, pain control, strength    Person(s) Educated Patient    Methods Explanation;Demonstration;Verbal cues;Handout    Comprehension Verbalized understanding;Returned demonstration;Verbal cues required            OT Short Term Goals - 04/13/20 1031      OT SHORT TERM GOAL #1   Title I with initial HEP.    Time --    Period Weeks    Status Achieved    Target Date 04/13/20      OT SHORT TERM GOAL #2   Title Pt will perform bathing with min A.    Time --    Period Weeks    Status Achieved   Pt reports showering with min A     OT SHORT TERM GOAL #3   Title Pt will donn shirt and pants with min A    Time 4    Period Weeks    Status On-going   mod A     OT SHORT TERM GOAL #4   Title Pt will demonstrate 30 A/ROM shoulder flexion for RUE with pain less than or equal to 4/10.    Time 4    Period Weeks    Status On-going      OT SHORT TERM GOAL #5   Title Pt will  demonstrate improved LUE fine motor coordination for ADLs as evidenced by decreasing 9 hole peg test score to 45 secs or less.    Time --    Period Weeks    Status Achieved   42.34     OT SHORT TERM GOAL #6   Title Pt/ caregiver will verbalize understanding of RUE positioning and edema control techniques to minimize pain and risk for injury    Time 4    Period Weeks    Status On-going   pt uses a pillow, she has splint, needs reinforcement     OT SHORT TERM GOAL #7   Title Pt will demonstrate at least 25% finger flexion/ extension in RUE in prep for functional use.    Time 4  Period Weeks    Status On-going   grossly 20%, not consistent            OT Long Term Goals - 04/13/20 1122      OT LONG TERM GOAL #1   Title Pt will perfrom  all basic ADLS with supervision    Time 8    Period Weeks    Status On-going      OT LONG TERM GOAL #2   Title Pt will demonstrate 45* RUE shoulder flexion in prep for functional reach with pain less than or equal to 3/10.    Time 8    Period Weeks    Status On-going      OT LONG TERM GOAL #3   Title Pt will use RUE as a stabilizer/ gross A at least 30 % of the time for ADLs/ IADLs.    Period Weeks    Status On-going      OT LONG TERM GOAL #4   Title Pt will increase LUE grip strength to 25 lbs or greater for increased ease with ADLs.    Time 8    Period Weeks    Status On-going      OT LONG TERM GOAL #5   Title Pt will perform simple home management/ snack prep with min A.    Time 8    Period Weeks    Status On-going      OT LONG TERM GOAL #6   Title Pt will demonstrate ability to grasp/ release a cup 2/3 trials with RUE.    Time 8    Period Weeks    Status On-going      OT LONG TERM GOAL #7   Title Pt will safely navigate a busy environment and locate items with supervision and  90% or greater accuracy without bumping into items.    Time 8    Period Weeks    Status On-going      OT LONG TERM GOAL #8   Title I with updated HEP.    Time 8    Period Weeks    Status On-going                 Plan - 05/05/20 1204    Clinical Impression Statement Patient with pain in right shoulder and heavily guarding the arm from movement.  Tends to keep arm in supination and with guarding she is likely activating the bicep most of the time.  Once we were able to move more she felt better and more relaxed into allowing therapist to assist with movements.  Encouraged patient to work on ROM at home throughout the day over the next week since she is at risk now for contractures.  She demonstrates understanding,  will follow up next session to see how exercises went at home.    OT Occupational Profile and History Detailed Assessment- Review of Records and additional review of physical, cognitive, psychosocial history related to current functional performance    Occupational performance deficits (Please refer to evaluation for details): ADL's;IADL's;Rest and Sleep;Work;Play;Leisure    Body Structure / Function / Physical Skills ADL;Balance;Coordination;Decreased knowledge of precautions;Decreased knowledge of use of DME;Dexterity;Edema;Mobility;Tone;Strength;IADL;Sensation;GMC;Gait;ROM;FMC;Flexibility;Pain;Vision;UE functional use;Endurance    Cognitive Skills Attention;Perception;Problem Solve;Safety Awareness;Thought    Rehab Potential Good    Clinical Decision Making Several treatment options, min-mod task modification necessary    Comorbidities Affecting Occupational Performance: None    Modification or Assistance to Complete Evaluation  Min-Moderate modification of tasks or assist with assess  necessary to complete eval    OT Frequency 3x / week    OT Duration 8 weeks    OT Treatment/Interventions Self-care/ADL training;Ultrasound;Energy conservation;Visual/perceptual remediation/compensation;Patient/family education;DME and/or AE instruction;Aquatic Therapy;Paraffin;Gait Training;Passive range of motion;Balance training;Fluidtherapy;Electrical Stimulation;Functional Mobility Training;Splinting;Moist Heat;Therapeutic exercise;Manual Therapy;Cognitive remediation/compensation;Manual lymph drainage;Neuromuscular education;Coping strategies training    Consulted and Agree with Plan of Care Patient;Family member/caregiver           Patient will benefit from skilled therapeutic intervention in order to improve the following deficits and impairments:   Body Structure / Function / Physical Skills: ADL,Balance,Coordination,Decreased knowledge of precautions,Decreased knowledge of use of  DME,Dexterity,Edema,Mobility,Tone,Strength,IADL,Sensation,GMC,Gait,ROM,FMC,Flexibility,Pain,Vision,UE functional use,Endurance Cognitive Skills: Attention,Perception,Problem Solve,Safety Awareness,Thought     Visit Diagnosis: Muscle weakness (generalized)  Other lack of coordination  Unsteadiness on feet    Problem List Patient Active Problem List   Diagnosis Date Noted  . Dyspareunia in female 04/05/2020  . History of abnormal cervical Pap smear 03/10/2020  . GIB (gastrointestinal bleeding) 02/28/2020  . Elevated BUN   . Prediabetes   . 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 Cerebral vasospasm   . Sinus tachycardia   . Dysphagia, post-stroke   . Thrombocytopenia ([place])   . Acute blood loss anemia   . Brain aneurysm   . ICH (intracerebral hemorrhage) ([place]) 01/05/2020  . History of adenomatous polyp of colon 02/07/2016  . 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 Fissure in ano 11/11/2014  . Hemorrhoids 11/11/2014  . Constipation 11/19/2013  . GERD (gastroesophageal reflux disease) 03/24/2013  . Irritable bowel syndrome with diarrhea 03/24/2013  . Herpes zoster 05/14/2005  . Abnormal Pap smear of cervix 05/15/1983  . History of cervical dysplasia 05/15/1983   Achilles Dunk, OTR/L, CLT  Manuelita Moxon 05/05/2020, 12:05 PM  [place] MAIN [facility] SERVICES [address] [place], [place], [address] Phone: [phone]   Fax:  [phone]  Name: Erika Stanton MRN: [id number] Date of Birth: 09-17-1955

## 2020-05-09 ENCOUNTER — Encounter: Payer: Self-pay | Admitting: Occupational Therapy

## 2020-05-09 ENCOUNTER — Other Ambulatory Visit: Payer: Self-pay

## 2020-05-09 ENCOUNTER — Ambulatory Visit: Payer: BC Managed Care – PPO | Admitting: Occupational Therapy

## 2020-05-09 DIAGNOSIS — M6281 Muscle weakness (generalized): Secondary | ICD-10-CM | POA: Diagnosis not present

## 2020-05-09 NOTE — Therapy (Signed)
[place] MAIN [facility] SERVICES [address] [place], [place], [address] Phone: [phone]   Fax:  [phone]  Occupational Therapy Progress Note  Dates of reporting period  04/11/2020   to   05/09/2020  Patient Details  Name: Erika Stanton MRN: [id number] Date of Birth: 06-23-1955 Referring Provider (OT): Dr. Naaman Plummer   Encounter Date: 05/09/2020   OT End of Session - 05/09/20 0931    Visit Number 20    Number of Visits 26    Date for OT Re-Evaluation 06/08/20    Authorization Time Period State [employer]    OT Start Time 0830    OT Stop Time 0920    OT Time Calculation (min) 50 min    Activity Tolerance Patient tolerated treatment well    Behavior During Therapy [facility] for tasks assessed/performed           Past Medical History:  Diagnosis Date  . Aneurysm ([place])   . [place]     Past Surgical History:  Procedure Laterality Date  . CHOLECYSTECTOMY    . COLONOSCOPY  11/2017   at [facility] no recurrent polyps.  suggest repeat surveillance study 11/2022.    Marland Kitchen COLONOSCOPY W/ POLYPECTOMY  06/2014   Dr Roxy Manns at [facility].  3 adenomatoous polyps, anal fissure.    . ESOPHAGOGASTRODUODENOSCOPY (EGD) WITH PROPOFOL N/A 01/27/2020   Procedure: ESOPHAGOGASTRODUODENOSCOPY (EGD) WITH PROPOFOL;  Surgeon: Mauri Pole, MD;  Location: [place] [facility];  Service: Endoscopy;  Laterality: N/A;  . IR 3D INDEPENDENT WKST  01/06/2020  . IR ANGIO INTRA EXTRACRAN SEL INTERNAL CAROTID BILAT MOD SED  01/06/2020  . IR ANGIO VERTEBRAL SEL VERTEBRAL UNI L MOD SED  01/06/2020  . IR ANGIOGRAM FOLLOW UP STUDY  01/06/2020  . IR ANGIOGRAM FOLLOW UP STUDY  01/06/2020  . IR ANGIOGRAM FOLLOW UP STUDY  01/06/2020  . IR ANGIOGRAM FOLLOW UP STUDY  01/06/2020  . IR ANGIOGRAM FOLLOW UP STUDY  01/06/2020  . IR ANGIOGRAM FOLLOW UP STUDY  01/06/2020  . IR ANGIOGRAM FOLLOW UP STUDY  01/06/2020  . IR ANGIOGRAM FOLLOW UP STUDY  01/06/2020  . IR ANGIOGRAM FOLLOW UP STUDY  01/06/2020  . IR ANGIOGRAM  FOLLOW UP STUDY  01/06/2020  . IR NEURO EACH ADD'L AFTER BASIC UNI RIGHT (MS)  01/06/2020  . IR TRANSCATH/EMBOLIZ  01/06/2020  . RADIOLOGY WITH ANESTHESIA N/A 01/06/2020   Procedure: IR WITH ANESTHESIA FOR ANEURYSM;  Surgeon: Consuella Lose, MD;  Location: [place];  Service: Radiology;  Laterality: N/A;  . TUBAL LIGATION      There were no vitals filed for this visit.   Subjective Assessment - 05/09/20 0929    Subjective  Patient reports her right shoulder is hurting some today and hand. "Heat usually makes it feel better."    Patient Stated Goals Pt would like to be as independent as possible.    Currently in Pain? Yes    Pain Score 4     Pain Location Hand   initially with ROM of the hand. Improved after ROM is initated.   Pain Orientation Right    Pain Descriptors / Indicators Aching          Pt. Husband was present during the session today, and expressed multiple concerns regarding limitations they are having with HEP at home including: pt.'s tolerance for exercising at home, pain, pt.'s motivation, follow through,aand splint application. Pt.'s husband also expressed concern about the progress of the patient' s right hand, and 5th digit  tightness. Pt./caregiver education was provided about RUE ROM, as well as strategies for successful ROM at home. Recommended pt. establish a daily schedule for performing HEP. Pt.'s husband demonstrated PROM with stretching for the digits of the right hand with cues for hand position. Pt./husband education was provided about the application of the right resting hand splint. Pt. continues to work on improving RUE  ROM, LUE hand grip strength, and coordination skills in order to work towards improving, and maximizing independence with ADLS, and IADLs.                     OT Education - 05/09/20 0931    Education Details ROM, pain control, strength    Person(s) Educated Patient    Methods Explanation;Demonstration;Verbal cues;Handout     Comprehension Verbalized understanding;Returned demonstration;Verbal cues required            OT Short Term Goals - 05/09/20 1031      OT SHORT TERM GOAL #3   Title Pt will donn shirt and pants with min A    Baseline Pt. is limited    Time 4    Period Weeks    Status On-going    Target Date 06/08/20      OT SHORT TERM GOAL #4   Title Pt will demonstrate 30 A/ROM shoulder flexion for RUE with pain less than or equal to 4/10.    Baseline Pt. presents with limited right shoulder flexion with pain between 2-4    Time 4    Period Weeks    Status On-going    Target Date 06/08/20      OT SHORT TERM GOAL #5   Title Pt will  demonstrate improved LUE fine motor coordination for ADLs as evidenced by decreasing 9 hole peg test score to 45 secs or less.    Baseline Left FMC 41 sec visa the 9 hole peg test    Time 6    Period Weeks      OT SHORT TERM GOAL #6   Title Pt/ caregiver will verbalize understanding of RUE positioning and edema control techniques to minimize pain and risk for injury    Baseline Pt. uses a pillow, and has a resting hand slpint,    Time 4    Period Weeks    Status On-going    Target Date 06/08/20      OT SHORT TERM GOAL #7   Title Pt will demonstrate at least 25% finger flexion/ extension in RUE in prep for functional use.    Baseline Limited PROM, and active digit flexion, and extension    Time 4    Period Weeks    Status On-going    Target Date 06/08/20             OT Long Term Goals - 05/09/20 1039      OT LONG TERM GOAL #1   Title Pt will perform  all basic ADLs with supervision    Baseline Pt. continues to be limited    Time 8    Period Weeks    Status On-going    Target Date 06/08/20      OT LONG TERM GOAL #2   Title Pt will demonstrate 45* RUE shoulder flexion in prep for functional reach with pain less than or equal to 3/10.    Baseline Pt. presents with limited right shoulder flexion with pain 2-4.    Time 8    Period Weeks     Status  On-going    Target Date 06/08/20      OT LONG TERM GOAL #3   Title Pt will use RUE as a stabilizer/ gross A at least 30 % of the time for ADLs/ IADLs.    Baseline Pt. is unalbe to use her right hand as agross assist secondary to limited ROM, and pain.    Time 12    Period Weeks    Status On-going    Target Date 06/08/20      OT LONG TERM GOAL #4   Title Pt will increase LUE grip strength to 25 lbs or greater for increased ease with ADLs.    Baseline Pt. presents with decreased left grip stregth    Time 8    Period Weeks    Status On-going    Target Date 06/08/20      OT LONG TERM GOAL #5   Title Pt will perform simple home management/ snack prep with min A.    Baseline Pt. has difficulty    Time 8    Period Weeks    Status On-going    Target Date 06/08/20      OT LONG TERM GOAL #6   Title Pt will demonstrate ability to grasp/ release a cup 2/3 trials with RUE.    Baseline Pt. is unable to grasp/release a cup.    Time 8    Period Weeks    Status On-going    Target Date 06/08/20      OT LONG TERM GOAL #7   Title Pt will safely navigate a busy environment and locate items with supervision and  90% or greater accuracy without bumping into items.    Baseline TBA    Time 8    Period Weeks    Status On-going    Target Date 06/08/20      OT LONG TERM GOAL #8   Title I with updated HEP.    Baseline Pt. requires assist, and cuing for HEP.    Time 8    Period Weeks    Status On-going    Target Date 06/08/20                 Plan - 05/09/20 0931    Clinical Impression Statement Pt. Husband was present during the session today, and expressed multiple concerns regarding limitations they are having with HEP at home including: pt.'s tolerance for exercising at home, pain, pt.'s motivation, follow through,aand splint application. Pt.'s husband also expressed concern about the progress of the patient' s right hand, and 5th digit tightness. Pt./caregiver education was  provided about RUE ROM, as well as strategies for successful ROM at home. Recommended pt. establish a daily schedule for performing HEP. Pt.'s husband demonstrated PROM with stretching for the digits of the right hand with cues for hand position. Pt./husband education was provided about the application of the right resting hand splint. Pt. continues to work on improving RUE  ROM, LUE hand grip strength, and coordination skills in order to work towards improving, and maximizing independence with ADLS, and IADLs.   OT Occupational Profile and History Detailed Assessment- Review of Records and additional review of physical, cognitive, psychosocial history related to current functional performance    Occupational performance deficits (Please refer to evaluation for details): ADL's;IADL's;Rest and Sleep;Work;Play;Leisure    Body Structure / Function / Physical Skills ADL;Balance;Coordination;Decreased knowledge of precautions;Decreased knowledge of use of DME;Dexterity;Edema;Mobility;Tone;Strength;IADL;Sensation;GMC;Gait;ROM;FMC;Flexibility;Pain;Vision;UE functional use;Endurance    Cognitive Skills Attention;Perception;Problem Solve;Safety Awareness;Thought    Rehab  Potential Good    Clinical Decision Making Several treatment options, min-mod task modification necessary    Comorbidities Affecting Occupational Performance: None    Modification or Assistance to Complete Evaluation  Min-Moderate modification of tasks or assist with assess necessary to complete eval    OT Frequency 3x / week    OT Duration 8 weeks    OT Treatment/Interventions Self-care/ADL training;Ultrasound;Energy conservation;Visual/perceptual remediation/compensation;Patient/family education;DME and/or AE instruction;Aquatic Therapy;Paraffin;Gait Training;Passive range of motion;Balance training;Fluidtherapy;Electrical Stimulation;Functional Mobility Training;Splinting;Moist Heat;Therapeutic exercise;Manual Therapy;Cognitive  remediation/compensation;Manual lymph drainage;Neuromuscular education;Coping strategies training    Plan Pt is transferring her care to [facility] OP as it is closer to home. She remains very fearful of moving RUE and responds well to heat, and estim.    Consulted and Agree with Plan of Care Patient;Family member/caregiver    Family Member Consulted Spose present during the session.           Patient will benefit from skilled therapeutic intervention in order to improve the following deficits and impairments:   Body Structure / Function / Physical Skills: ADL,Balance,Coordination,Decreased knowledge of precautions,Decreased knowledge of use of DME,Dexterity,Edema,Mobility,Tone,Strength,IADL,Sensation,GMC,Gait,ROM,FMC,Flexibility,Pain,Vision,UE functional use,Endurance Cognitive Skills: Attention,Perception,Problem Solve,Safety Awareness,Thought     Visit Diagnosis: Muscle weakness (generalized)    Problem List Patient Active Problem List   Diagnosis Date Noted  . Dyspareunia in female 04/05/2020  . History of abnormal cervical Pap smear 03/10/2020  . GIB (gastrointestinal bleeding) 02/28/2020  . Elevated BUN   . Prediabetes   . 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 Cerebral vasospasm   . Sinus tachycardia   . Dysphagia, post-stroke   . Thrombocytopenia ([place])   . Acute blood loss anemia   . Brain aneurysm   . ICH (intracerebral hemorrhage) ([place]) 01/05/2020  . History of adenomatous polyp of colon 02/07/2016  . 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 Fissure in ano 11/11/2014  . Hemorrhoids 11/11/2014  . Constipation 11/19/2013  . GERD (gastroesophageal reflux disease) 03/24/2013  . Irritable bowel syndrome with diarrhea 03/24/2013  . Herpes zoster 05/14/2005  . Abnormal Pap smear of cervix 05/15/1983  . History of cervical dysplasia 05/15/1983    Harrel Carina, MS, OTR/L 05/09/2020, 10:47 AM  [place] MAIN [facility]  SERVICES [address] [place], [place], [address] Phone: [phone]   Fax:  [phone]  Name: Erika Stanton MRN: [id number] Date of Birth: 1955/12/08

## 2020-05-10 ENCOUNTER — Ambulatory Visit: Payer: BC Managed Care – PPO | Admitting: Physical Therapy

## 2020-05-10 ENCOUNTER — Encounter: Payer: Self-pay | Admitting: Physical Therapy

## 2020-05-10 ENCOUNTER — Ambulatory Visit: Payer: BC Managed Care – PPO | Admitting: Speech Pathology

## 2020-05-10 ENCOUNTER — Other Ambulatory Visit: Payer: Self-pay

## 2020-05-10 ENCOUNTER — Ambulatory Visit: Payer: BC Managed Care – PPO | Admitting: Occupational Therapy

## 2020-05-10 DIAGNOSIS — M6281 Muscle weakness (generalized): Secondary | ICD-10-CM | POA: Diagnosis not present

## 2020-05-10 DIAGNOSIS — R278 Other lack of coordination: Secondary | ICD-10-CM

## 2020-05-10 DIAGNOSIS — R1312 Dysphagia, oropharyngeal phase: Secondary | ICD-10-CM

## 2020-05-10 DIAGNOSIS — R2689 Other abnormalities of gait and mobility: Secondary | ICD-10-CM

## 2020-05-10 DIAGNOSIS — R2681 Unsteadiness on feet: Secondary | ICD-10-CM

## 2020-05-10 DIAGNOSIS — R41841 Cognitive communication deficit: Secondary | ICD-10-CM

## 2020-05-10 NOTE — Therapy (Signed)
[place] MAIN [facility] SERVICES [address] [place], [place], [address] Phone: [phone]   Fax:  [phone]  Occupational Therapy Treatment  Patient Details  Name: Erika Stanton MRN: [id number] Date of Birth: 04-29-1956 Referring Provider (OT): Dr. Naaman Plummer   Encounter Date: 05/10/2020   OT End of Session - 05/10/20 1122    Visit Number 21    Number of Visits 26    Date for OT Re-Evaluation 06/08/20    Authorization Time [place]    OT Start Time 1015    OT Stop Time 1100    OT Time Calculation (min) 45 min    Activity Tolerance Patient tolerated treatment well    Behavior During Therapy [facility] for tasks assessed/performed           Past Medical History:  Diagnosis Date  . Aneurysm ([place])   . Melena     Past Surgical History:  Procedure Laterality Date  . CHOLECYSTECTOMY    . COLONOSCOPY  11/2017   at [facility]. no recurrent polyps.  suggest repeat surveillance study 11/2022.    Marland Kitchen COLONOSCOPY W/ POLYPECTOMY  06/2014   Dr Roxy Manns at [facility].  3 adenomatoous polyps, anal fissure.    . ESOPHAGOGASTRODUODENOSCOPY (EGD) WITH PROPOFOL N/A 01/27/2020   Procedure: ESOPHAGOGASTRODUODENOSCOPY (EGD) WITH PROPOFOL;  Surgeon: Mauri Pole, MD;  Location: [place] [facility];  Service: Endoscopy;  Laterality: N/A;  . IR 3D INDEPENDENT WKST  01/06/2020  . IR ANGIO INTRA EXTRACRAN SEL INTERNAL CAROTID BILAT MOD SED  01/06/2020  . IR ANGIO VERTEBRAL SEL VERTEBRAL UNI L MOD SED  01/06/2020  . IR ANGIOGRAM FOLLOW UP STUDY  01/06/2020  . IR ANGIOGRAM FOLLOW UP STUDY  01/06/2020  . IR ANGIOGRAM FOLLOW UP STUDY  01/06/2020  . IR ANGIOGRAM FOLLOW UP STUDY  01/06/2020  . IR ANGIOGRAM FOLLOW UP STUDY  01/06/2020  . IR ANGIOGRAM FOLLOW UP STUDY  01/06/2020  . IR ANGIOGRAM FOLLOW UP STUDY  01/06/2020  . IR ANGIOGRAM FOLLOW UP STUDY  01/06/2020  . IR ANGIOGRAM FOLLOW UP STUDY  01/06/2020  . IR ANGIOGRAM FOLLOW UP STUDY  01/06/2020  . IR NEURO EACH ADD'L AFTER BASIC  UNI RIGHT (MS)  01/06/2020  . IR TRANSCATH/EMBOLIZ  01/06/2020  . RADIOLOGY WITH ANESTHESIA N/A 01/06/2020   Procedure: IR WITH ANESTHESIA FOR ANEURYSM;  Surgeon: Consuella Lose, MD;  Location: [place];  Service: Radiology;  Laterality: N/A;  . TUBAL LIGATION      There were no vitals filed for this visit.   Subjective Assessment - 05/10/20 1120    Subjective  Pt.'s husband arrived at the end of the session.    Pertinent History ICH 01/06/20, Aneurysm. PMH: OA bilateral knees and hands    Patient Stated Goals Pt would like to be as independent as possible.    Currently in Pain? Yes    Pain Score 4     Pain Location Arm    Pain Orientation Right    Pain Descriptors / Indicators Aching    Pain Type Chronic pain    Pain Onset More than a month ago          OT TREATMENT   Therapeutic Exercise:  Pt. performed tolerated minimal shoulder abduction, and flexion with 2-4/10 pain. Pt. was able to tolerate AAROM for elbow flexion, elbow extension with resistance, AAROM supination with PROM to the end range. PROM for right wrist, digit MP, PIP, and DIP flexion, and extension. Pt. was able to  tolerate positioning her right forearm in pronation with her palm placed down on a pillow with her digits in extension, and thumb in abduction.  Pt. was able to tolerate more ROM when her attention is diverted with conversation. Pt. Tolerated moist heat modality to the right shoulder, and right hand prior to ROM. Pt., and husband education was provided about ROM, and self ROM.   Manual Therapy:  Pt. tolerated right scapular mobes in sitting for elevation, depression, abduction/rotation. Mobilizations were performed prior to, independent of, and in preparation for ROM.   Pt.'s husband was present for the beginning of the session today. Pt.'s husband expressed concern about the progress with her RUE, and hand.  Pt. Tolerated PROM in the right shoulder, wrist, and fingers. Pt.'s ROM continues to be  limited by pain. Pt. Reports 2-4/10 pain in the right shoulder, digits, and thumb.  Pt. was able to achieve increased extension with PROM, and self-ROM in the right digit MP, PIP, and DIPs, and thumb palmar abduction, as well as radial abduction with decreased pain following slow gentle ROM. Pt. Was able to tolerate positioning her forearm in pronation on a pillow with her digits extended, and thumb abducted. Pt. reported pain initially, however did improve with ROM. Pt. Is able to initiate active right thumb, and 2nd digit movement to initiate formulating a lateral grasp pattern.                         OT Education - 05/10/20 1121    Education Details ROM, pain control, strength    Person(s) Educated Patient    Methods Explanation;Demonstration;Verbal cues;Handout    Comprehension Verbalized understanding;Returned demonstration;Verbal cues required            OT Short Term Goals - 05/09/20 1031      OT SHORT TERM GOAL #3   Title Pt will donn shirt and pants with min A    Baseline Pt. is limited    Time 4    Period Weeks    Status On-going    Target Date 06/08/20      OT SHORT TERM GOAL #4   Title Pt will demonstrate 30 A/ROM shoulder flexion for RUE with pain less than or equal to 4/10.    Baseline Pt. presents with limited right shoulder flexion with pain between 2-4    Time 4    Period Weeks    Status On-going    Target Date 06/08/20      OT SHORT TERM GOAL #5   Title Pt will  demonstrate improved LUE fine motor coordination for ADLs as evidenced by decreasing 9 hole peg test score to 45 secs or less.    Baseline Left FMC 41 sec visa the 9 hole peg test    Time 6    Period Weeks      OT SHORT TERM GOAL #6   Title Pt/ caregiver will verbalize understanding of RUE positioning and edema control techniques to minimize pain and risk for injury    Baseline Pt. uses a pillow, and has a resting hand slpint,    Time 4    Period Weeks    Status On-going     Target Date 06/08/20      OT SHORT TERM GOAL #7   Title Pt will demonstrate at least 25% finger flexion/ extension in RUE in prep for functional use.    Baseline Limited PROM, and active digit flexion, and extension  Time 4    Period Weeks    Status On-going    Target Date 06/08/20             OT Long Term Goals - 05/09/20 1039      OT LONG TERM GOAL #1   Title Pt will perform  all basic ADLs with supervision    Baseline Pt. continues to be limited    Time 8    Period Weeks    Status On-going    Target Date 06/08/20      OT LONG TERM GOAL #2   Title Pt will demonstrate 45* RUE shoulder flexion in prep for functional reach with pain less than or equal to 3/10.    Baseline Pt. presents with limited right shoulder flexion with pain 2-4.    Time 8    Period Weeks    Status On-going    Target Date 06/08/20      OT LONG TERM GOAL #3   Title Pt will use RUE as a stabilizer/ gross A at least 30 % of the time for ADLs/ IADLs.    Baseline Pt. is unalbe to use her right hand as agross assist secondary to limited ROM, and pain.    Time 12    Period Weeks    Status On-going    Target Date 06/08/20      OT LONG TERM GOAL #4   Title Pt will increase LUE grip strength to 25 lbs or greater for increased ease with ADLs.    Baseline Pt. presents with decreased left grip stregth    Time 8    Period Weeks    Status On-going    Target Date 06/08/20      OT LONG TERM GOAL #5   Title Pt will perform simple home management/ snack prep with min A.    Baseline Pt. has difficulty    Time 8    Period Weeks    Status On-going    Target Date 06/08/20      OT LONG TERM GOAL #6   Title Pt will demonstrate ability to grasp/ release a cup 2/3 trials with RUE.    Baseline Pt. is unable to grasp/release a cup.    Time 8    Period Weeks    Status On-going    Target Date 06/08/20      OT LONG TERM GOAL #7   Title Pt will safely navigate a busy environment and locate items with  supervision and  90% or greater accuracy without bumping into items.    Baseline TBA    Time 8    Period Weeks    Status On-going    Target Date 06/08/20      OT LONG TERM GOAL #8   Title I with updated HEP.    Baseline Pt. requires assist, and cuing for HEP.    Time 8    Period Weeks    Status On-going    Target Date 06/08/20                 Plan - 05/10/20 1122    Clinical Impression Statement Pt.'s husband was present for the beginning of the session today. Pt.'s husband expressed concern about the progress with her RUE, and hand.  Pt. Tolerated PROM in the right shoulder, wrist, and fingers. Pt.'s ROM continues to be limited by pain. Pt. Reports 2-4/10 pain in the right shoulder, digits, and thumb.  Pt. was able to achieve increased extension  with PROM, and self-ROM in the right digit MP, PIP, and DIPs, and thumb palmar abduction, as well as radial abduction with decreased pain following slow gentle ROM. Pt. Was able to tolerate positioning her forearm in pronation on a pillow with her digits extended, and thumb abducted. Pt. reported pain initially, however did improve with ROM. Pt. Is able to initiate active right thumb, and 2nd digit movement to initiate formulating a lateral grasp pattern.     OT Occupational Profile and History Detailed Assessment- Review of Records and additional review of physical, cognitive, psychosocial history related to current functional performance    Occupational performance deficits (Please refer to evaluation for details): ADL's;IADL's;Rest and Sleep;Work;Play;Leisure    Body Structure / Function / Physical Skills ADL;Balance;Coordination;Decreased knowledge of precautions;Decreased knowledge of use of DME;Dexterity;Edema;Mobility;Tone;Strength;IADL;Sensation;GMC;Gait;ROM;FMC;Flexibility;Pain;Vision;UE functional use;Endurance    Cognitive Skills Attention;Perception;Problem Solve;Safety Awareness;Thought    Rehab Potential Good    Clinical  Decision Making Several treatment options, min-mod task modification necessary    Comorbidities Affecting Occupational Performance: None    Modification or Assistance to Complete Evaluation  Min-Moderate modification of tasks or assist with assess necessary to complete eval    OT Frequency 3x / week    OT Duration 8 weeks    OT Treatment/Interventions Self-care/ADL training;Ultrasound;Energy conservation;Visual/perceptual remediation/compensation;Patient/family education;DME and/or AE instruction;Aquatic Therapy;Paraffin;Gait Training;Passive range of motion;Balance training;Fluidtherapy;Electrical Stimulation;Functional Mobility Training;Splinting;Moist Heat;Therapeutic exercise;Manual Therapy;Cognitive remediation/compensation;Manual lymph drainage;Neuromuscular education;Coping strategies training    Consulted and Agree with Plan of Care Patient;Family member/caregiver    Family Member Consulted Spose present during the session.           Patient will benefit from skilled therapeutic intervention in order to improve the following deficits and impairments:   Body Structure / Function / Physical Skills: ADL,Balance,Coordination,Decreased knowledge of precautions,Decreased knowledge of use of DME,Dexterity,Edema,Mobility,Tone,Strength,IADL,Sensation,GMC,Gait,ROM,FMC,Flexibility,Pain,Vision,UE functional use,Endurance Cognitive Skills: Attention,Perception,Problem Solve,Safety Awareness,Thought     Visit Diagnosis: Muscle weakness (generalized)  Other lack of coordination    Problem List Patient Active Problem List   Diagnosis Date Noted  . Dyspareunia in female 04/05/2020  . History of abnormal cervical Pap smear 03/10/2020  . GIB (gastrointestinal bleeding) 02/28/2020  . Elevated BUN   . Prediabetes   . 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 Cerebral vasospasm   . Sinus tachycardia   . Dysphagia, post-stroke   . Thrombocytopenia ([place])   . Acute blood loss anemia   .  Brain aneurysm   . ICH (intracerebral hemorrhage) ([place]) 01/05/2020  . History of adenomatous polyp of colon 02/07/2016  . 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 Fissure in ano 11/11/2014  . Hemorrhoids 11/11/2014  . Constipation 11/19/2013  . GERD (gastroesophageal reflux disease) 03/24/2013  . Irritable bowel syndrome with diarrhea 03/24/2013  . Herpes zoster 05/14/2005  . Abnormal Pap smear of cervix 05/15/1983  . History of cervical dysplasia 05/15/1983    Harrel Carina, MS, OTR/L 05/10/2020, 11:24 AM  [place] MAIN [facility] SERVICES [address] [place], [place], [address] Phone: [phone]   Fax:  [phone]  Name: Erika Stanton MRN: [id number] Date of Birth: 1956/02/11

## 2020-05-10 NOTE — Therapy (Signed)
[place] MAIN [facility] SERVICES [address] [place], [place], [address] Phone: [phone]   Fax:  [phone]  Physical Therapy Treatment  Patient Details  Name: Erika Stanton MRN: [id number] Date of Birth: 12-17-1955 Referring Provider (PT): Alger Simons, MD   Encounter Date: 05/10/2020   PT End of Session - 05/10/20 0939    Visit Number 22    Number of Visits 25    Date for PT Re-Evaluation 0000000   60 day cert, 30 day poc   Authorization Type [employer]    PT Start Time 0932    PT Stop Time 1015    PT Time Calculation (min) 43 min    Equipment Utilized During Treatment Gait belt    Activity Tolerance Patient tolerated treatment well    Behavior During Therapy [facility] for tasks assessed/performed           Past Medical History:  Diagnosis Date  . Aneurysm ([place])   . Melena     Past Surgical History:  Procedure Laterality Date  . CHOLECYSTECTOMY    . COLONOSCOPY  11/2017   at [facility]. no recurrent polyps.  suggest repeat surveillance study 11/2022.    Marland Kitchen COLONOSCOPY W/ POLYPECTOMY  06/2014   Dr Roxy Manns at [facility].  3 adenomatoous polyps, anal fissure.    . ESOPHAGOGASTRODUODENOSCOPY (EGD) WITH PROPOFOL N/A 01/27/2020   Procedure: ESOPHAGOGASTRODUODENOSCOPY (EGD) WITH PROPOFOL;  Surgeon: Mauri Pole, MD;  Location: [place] [facility];  Service: Endoscopy;  Laterality: N/A;  . IR 3D INDEPENDENT WKST  01/06/2020  . IR ANGIO INTRA EXTRACRAN SEL INTERNAL CAROTID BILAT MOD SED  01/06/2020  . IR ANGIO VERTEBRAL SEL VERTEBRAL UNI L MOD SED  01/06/2020  . IR ANGIOGRAM FOLLOW UP STUDY  01/06/2020  . IR ANGIOGRAM FOLLOW UP STUDY  01/06/2020  . IR ANGIOGRAM FOLLOW UP STUDY  01/06/2020  . IR ANGIOGRAM FOLLOW UP STUDY  01/06/2020  . IR ANGIOGRAM FOLLOW UP STUDY  01/06/2020  . IR ANGIOGRAM FOLLOW UP STUDY  01/06/2020  . IR ANGIOGRAM FOLLOW UP STUDY  01/06/2020  . IR ANGIOGRAM FOLLOW UP STUDY  01/06/2020  . IR ANGIOGRAM FOLLOW UP STUDY  01/06/2020  . IR  ANGIOGRAM FOLLOW UP STUDY  01/06/2020  . IR NEURO EACH ADD'L AFTER BASIC UNI RIGHT (MS)  01/06/2020  . IR TRANSCATH/EMBOLIZ  01/06/2020  . RADIOLOGY WITH ANESTHESIA N/A 01/06/2020   Procedure: IR WITH ANESTHESIA FOR ANEURYSM;  Surgeon: Consuella Lose, MD;  Location: [place];  Service: Radiology;  Laterality: N/A;  . TUBAL LIGATION      There were no vitals filed for this visit.   Subjective Assessment - 05/10/20 0937    Subjective Patient reports doing well; She denies any new falls. Reports continued soreness in RUE;    Patient is accompained by: Family member    Limitations Walking;Standing;House hold activities    How long can you walk comfortably? Did a little walking yesterday up/down the hallway    Patient Stated Goals Use my right hand like I used to.    Currently in Pain? Yes    Pain Score 2     Pain Location Arm    Pain Orientation Right    Pain Descriptors / Indicators Aching    Pain Type Chronic pain    Pain Onset More than a month ago    Pain Frequency Constant    Aggravating Factors  movement    Pain Relieving Factors heat/rest    Effect of Pain  on Daily Activities decreased activity tolerance;    Multiple Pain Sites No    Pain Onset More than a month ago              TREATMENT: Warm up on Nustep LUE and BLE x5 min (unbilled); NMR:  Resisted walking 12.5# forward/backward, side stepping (4 way) x2 laps each with min A for safety and cues for eccentric control for better dynamic balance;   Instructed patient in [place] exercise:  Penguin race x3 reps on bunny slope; progressed from 9 to 15 to 20 fish caught indicating improved weight shift and motor control with game. Patient also progressed from holding on with one hand to no hands but does require min A from therapist for balance control;  She does exhibit heavy upper trunk weight shift with decreased LE weight shift/pelvic positioning.   Instructed patient in scale game side/side to challenge weight  shift with instruction to keep shoulders in neutral and isolate gluteal/pelvic weight shift with hip extension for better lateral weight shift. Patient had difficulty initiating but was able to exhibit better weight shift with cues and repetition. Completed 3 rounds at 30 sec each;   Transitioned to parallel bars with patient in mid stance position, with instruction in forward/backward weight shift with cues for erect posture and increasing pelvic protraction/retraction x5 reps each;  Progressed to PNF D1/D2 pelvic weight shift with therapist resistance x5 reps each;   Advanced HEP with standing weight shift to facilitate better hip/pelvic mobility; See patient instructions. Patient tolerated session well. She does exhibit improvement in weight shift and upper trunk control with increased repetition and instruction to increase hip/pelvic mobility;                         PT Education - 05/10/20 0938    Education Details core strengthening, balance, HEP    Person(s) Educated Patient    Methods Explanation;Verbal cues    Comprehension Verbalized understanding;Returned demonstration;Verbal cues required;Need further instruction            PT Short Term Goals - 04/22/20 1957      PT SHORT TERM GOAL #1   Title STGs=LTGs             PT Long Term Goals - 04/22/20 1957      PT LONG TERM GOAL #1   Title Pt will be independent with progressive HEP for balance and strengthening to continue gains on own.    Baseline Pt reports that her PT exercises are going well and doing as instructed. Will continue to progress adding more core stabilization.    Time 4    Period Weeks    Status On-going    Target Date 05/22/20      PT LONG TERM GOAL #2   Title Pt will increase FGA from 26/30 to 28/30 for improved balance and gait to decrease fall risk.    Baseline 03/23/20 19/30, 04/22/20 26/30    Time 4    Period Weeks    Status Revised    Target Date 05/22/20      PT LONG  TERM GOAL #3   Title Pt will perform 5TSS in </=11 secs without UE support in order to indicate improved balance and functional strength    Baseline 16 seconds with L UE support 03/21/20, 04/22/20 13.54 sec without hands from mat    Time 4    Period Weeks    Status Revised  Target Date 05/22/20      PT LONG TERM GOAL #4   Title Pt will ambulate >1000' on varied surfaces independently with no LOB for improved community ambulation without AD.    Baseline 1050' including >300 feet of grass surface with SBA 03/21/20; we worked on ramp and curb also. 04/22/20 1150' mod I/supervision on varied surfaces inside due to cold.    Time 4    Period Weeks    Status On-going    Target Date 05/22/20                 Plan - 05/10/20 1047    Clinical Impression Statement Patient motivated and participated well within session. She was instructed in advanced balance tasks working on weight shift and postural control. When shifting weight side/side patient often leans with her shoulders and exhibits decreased hip/pelvic mobility with decreased LE motor control/involvement. Patient instructed in weight shift activities on [employer] to challenge weight shift and improve dynamic balance control. She was able to exhibit better weight shift with subsequent repetitions but continues to exhibit decreased hip extension and slumped posture. Advanced HEP with forward/backward weight shift with instruction to focus on gluteal/pelvic activation. She would benefit from additional skilled PT Intervention to improve strength, balance and mobility;    Personal Factors and Comorbidities Age;Comorbidity 1;Profession    Comorbidities B ICHs    Examination-Activity Limitations Bend;Carry;Lift;Locomotion Level;Reach Overhead;Squat;Stairs;Stand;Transfers    Examination-Participation Restrictions Cleaning;Community Activity;Driving;Occupation;Yard Work;Laundry;Meal Prep    Stability/Clinical Decision Making  Evolving/Moderate complexity    Rehab Potential Good    PT Frequency 2x / week   then 1x/week for 2 weeks   PT Duration 2 weeks    PT Treatment/Interventions ADLs/Self Care Home Management;Aquatic Therapy;Electrical Stimulation;DME Instruction;Gait training;Stair training;Functional mobility training;Therapeutic activities;Therapeutic exercise;Balance training;Neuromuscular re-education;Cognitive remediation;Patient/family education;Orthotic Fit/Training;Manual techniques;Passive range of motion;Dry needling;Energy conservation;Vestibular;Visual/perceptual remediation/compensation    PT Next Visit Plan Recert was just completed for 2x/week for 2 weeks followed by 1x/week for 2 weeks for transfer to [facility]. Continue with dynamic gait and balance activities. Activities to try to incorporate more core and shoulder activation as able, LLE NMR. Pt does have chronic knee issues and is limited some due to that.    Consulted and Agree with Plan of Care Patient;Family member/caregiver           Patient will benefit from skilled therapeutic intervention in order to improve the following deficits and impairments:  Abnormal gait,Decreased activity tolerance,Decreased balance,Decreased cognition,Decreased endurance,Decreased knowledge of use of DME,Decreased mobility,Decreased strength,Impaired perceived functional ability,Impaired flexibility,Impaired sensation,Postural dysfunction,Improper body mechanics,Impaired vision/preception,Impaired UE functional use,Impaired tone  Visit Diagnosis: Muscle weakness (generalized)  Other lack of coordination  Unsteadiness on feet  Other abnormalities of gait and mobility     Problem List Patient Active Problem List   Diagnosis Date Noted  . Dyspareunia in female 04/05/2020  . History of abnormal cervical Pap smear 03/10/2020  . GIB (gastrointestinal bleeding) 02/28/2020  . Elevated BUN   . Prediabetes   . 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  Cerebral vasospasm   . Sinus tachycardia   . Dysphagia, post-stroke   . Thrombocytopenia ([place])   . Acute blood loss anemia   . Brain aneurysm   . ICH (intracerebral hemorrhage) ([place]) 01/05/2020  . History of adenomatous polyp of colon 02/07/2016  . 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 Fissure in ano 11/11/2014  . Hemorrhoids 11/11/2014  . Constipation 11/19/2013  . GERD (gastroesophageal reflux disease) 03/24/2013  . Irritable bowel syndrome  with diarrhea 03/24/2013  . Herpes zoster 05/14/2005  . Abnormal Pap smear of cervix 05/15/1983  . History of cervical dysplasia 05/15/1983    Jack Mineau PT, DPT 05/10/2020, 10:54 AM  [facility] MAIN [facility] SERVICES [address] [place], [place], [address] Phone: [phone]   Fax:  [phone]  Name: Erika Stanton MRN: [id number] Date of Birth: 22-May-1955

## 2020-05-10 NOTE — Patient Instructions (Signed)
Access Code: [facility] URL: [email or web address] Date: 05/10/2020 Prepared by: Zettie Pho  Exercises Staggered Stance Forward Backward Weight Shift with Unilateral Counter Support - 1 x daily - 7 x weekly - 1 sets - 10 reps

## 2020-05-10 NOTE — Therapy (Signed)
[place] MAIN [facility] SERVICES [address] [place], [place], [address] Phone: [phone]   Fax:  [phone]  Speech Language Pathology Treatment and Recertification  Patient Details  Name: Erika Stanton MRN: [id number] Date of Birth: 03-03-1956 Referring Provider (SLP): Alger Simons, MD   Encounter Date: 05/10/2020   End of Session - 05/10/20 1327    Visit Number 17    Number of Visits 33    Date for SLP Re-Evaluation 07/09/20    Authorization - Visit Number 7    Progress Note Due on Visit 10    SLP Start Time 1100    SLP Stop Time  1155    SLP Time Calculation (min) 55 min    Activity Tolerance Patient tolerated treatment well           Past Medical History:  Diagnosis Date  . Aneurysm ([place])   . Melena     Past Surgical History:  Procedure Laterality Date  . CHOLECYSTECTOMY    . COLONOSCOPY  11/2017   at [facility]. no recurrent polyps.  suggest repeat surveillance study 11/2022.    Marland Kitchen COLONOSCOPY W/ POLYPECTOMY  06/2014   Dr Roxy Manns at [facility].  3 adenomatoous polyps, anal fissure.    . ESOPHAGOGASTRODUODENOSCOPY (EGD) WITH PROPOFOL N/A 01/27/2020   Procedure: ESOPHAGOGASTRODUODENOSCOPY (EGD) WITH PROPOFOL;  Surgeon: Mauri Pole, MD;  Location: [place] ENDOSCOPY;  Service: Endoscopy;  Laterality: N/A;  . IR 3D INDEPENDENT WKST  01/06/2020  . IR ANGIO INTRA EXTRACRAN SEL INTERNAL CAROTID BILAT MOD SED  01/06/2020  . IR ANGIO VERTEBRAL SEL VERTEBRAL UNI L MOD SED  01/06/2020  . IR ANGIOGRAM FOLLOW UP STUDY  01/06/2020  . IR ANGIOGRAM FOLLOW UP STUDY  01/06/2020  . IR ANGIOGRAM FOLLOW UP STUDY  01/06/2020  . IR ANGIOGRAM FOLLOW UP STUDY  01/06/2020  . IR ANGIOGRAM FOLLOW UP STUDY  01/06/2020  . IR ANGIOGRAM FOLLOW UP STUDY  01/06/2020  . IR ANGIOGRAM FOLLOW UP STUDY  01/06/2020  . IR ANGIOGRAM FOLLOW UP STUDY  01/06/2020  . IR ANGIOGRAM FOLLOW UP STUDY  01/06/2020  . IR ANGIOGRAM FOLLOW UP STUDY  01/06/2020  . IR NEURO EACH ADD'L AFTER  BASIC UNI RIGHT (MS)  01/06/2020  . IR TRANSCATH/EMBOLIZ  01/06/2020  . RADIOLOGY WITH ANESTHESIA N/A 01/06/2020   Procedure: IR WITH ANESTHESIA FOR ANEURYSM;  Surgeon: Consuella Lose, MD;  Location: [place];  Service: Radiology;  Laterality: N/A;  . TUBAL LIGATION      There were no vitals filed for this visit.   Subjective Assessment - 05/10/20 1311    Subjective Patient's husband joins for session today    Patient is accompained by: Family member    Currently in Pain? Yes    Pain Score 4     Pain Location Arm                 ADULT SLP TREATMENT - 05/10/20 1100      General Information   Behavior/Cognition Alert;Cooperative;Pleasant mood    HPI Pt presented with headache and lt hemiplegia on 01-05-20. 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 Stent and embolization of rt ICA on 01-06-20. Pt rec'd ST on CIR for cognition and swallowing and was d/c'd on dys III/thin with supervision level cues for swallow precautions. Erika Stanton was d/c'd  02-23-20.      Treatment Provided   Treatment provided Cognitive-Linquistic      Cognitive-Linquistic Treatment   Treatment focused on Cognition    Skilled Treatment Facilitated discussion of barriers and goals in session today with patient and husband. We identified that patient motivation, initiation has been a barrier for her in completing tasks at home and exercises for her physical and occupational therapy. Education provided on how CVA can affect motivation and initiation, and that this can also result from depression. Patient was tearful, in session, briefly as we discussed this. Encouraged pt, husband to follow up with mental health provider as they report they are both struggling with emotions related to adapting to changes since pt's CVA. Husband has information for local  support groups, and was encouraged to discuss with pt's neurologist or PCP to determine if medication would be an appropriate option for pt. Educated pt and spouse on use of a schedule and routine, and how spouse can/should assist pt with planning her day the night before, as well as functional cognitive activities such as meal planning, grocery list making, following her daily schedule. Patient was resistant to writing date on her schedule, initially, but did so with encouragement. Pt ID'd items and times appropriate for the schedule today (therapy appointments, errand to [facility], exercises, etc). Demo'd to husband how to have pt assist in planning the schedule for the following day each evening. We opted to update goals for this today.      Assessment / Recommendations / Plan   Plan Goals updated      Progression Toward Goals   Progression toward goals Progressing toward goals              SLP Short Term Goals - 05/10/20 1333      SLP SHORT TERM GOAL #1   Title pt to complete cognitive linguistic testing    Period --   or 4 total sessions   Status Achieved      SLP SHORT TERM GOAL #2   Title pt will demo emergent awareness 100% with min complex therapy tasks x3 visits    Baseline 03-30-20;    Status Partially Met      SLP SHORT TERM GOAL #3   Title pt will demo appriopriate aspiration precuations with suggested POs x3 sessions    Status Deferred      SLP SHORT TERM GOAL #4   Title pt will solve household probelms using functional solutions x3 sessions    Baseline 03-30-20; 04-11-20    Status Achieved      SLP SHORT TERM GOAL #5   Title pt will demo selective attention (internal distraction) to a simple functional task for 10 minutes with rare min A back to task in 3 sessions    Baseline 03-28-20    Period Weeks    Status Achieved      Additional Short Term Goals   Additional Short Term Goals Yes      SLP SHORT TERM GOAL #6   Title Patient will preplan daily schedule 5/7  days.    Time 4    Period Weeks    Status New            SLP Long Term Goals - 05/10/20 1335      SLP LONG TERM GOAL #1   Title pt will demo anticipatory awareness by double checking all work with a non-verbal cue x3 sessions    Time 8    Period Weeks  or 17 total sessions, for all LTGs   Status Partially Met      SLP LONG TERM GOAL #2   Title pt will follow swallow precautions with POs with modified independence x3 sessions    Status Deferred      SLP LONG TERM GOAL #3   Title pt will demo [facility] skills in solving min-mod complex problems in WNL amount of time with modified independence (double checking answers , etc)    Time 8    Period Weeks    Status Partially Met      SLP LONG TERM GOAL #4   Title pt will demo simple alternating attention in functional cognitive linguistic tasks in 3 sessions    Time 8    Period Weeks    Status Partially Met      SLP LONG TERM GOAL #5   Title Patient will use schedule to complete 3 pre-planned tasks with min cues from spouse.    Time 8    Period Weeks    Status New            Plan - 05/10/20 1328    Clinical Impression Statement Patient and spouse express desire for her to be more involved in daily tasks at home; she has been struggling with motivation and initiation. Today we discussed options and strategies such as use of a routine, schedule, and pre-planning to increase her participation in desired tasks. Patient has continued to work on cognitive activities outside of therapy, and remains motivated for improvement. Will recert today for 8 weeks with updated goals; if initiation/carryover of schedule do not improve, will consider therapy hold/ or d/c.    Speech Therapy Frequency 2x / week    Duration 8 weeks    Treatment/Interventions Aspiration precaution training;Pharyngeal strengthening exercises;Compensatory techniques;Diet toleration management by SLP;Trials of upgraded texture/liquids;Cueing hierarchy;Cognitive  reorganization;Internal/external aids;Patient/family education;SLP instruction and feedback;Functional tasks    Potential to Achieve Goals Good    Consulted and Agree with Plan of Care Patient;Family member/caregiver           Patient will benefit from skilled therapeutic intervention in order to improve the following deficits and impairments:   Cognitive communication deficit  Dysphagia, oropharyngeal phase    Problem List Patient Active Problem List   Diagnosis Date Noted  . Dyspareunia in female 04/05/2020  . History of abnormal cervical Pap smear 03/10/2020  . GIB (gastrointestinal bleeding) 02/28/2020  . Elevated BUN   . Prediabetes   . 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 Cerebral vasospasm   . Sinus tachycardia   . Dysphagia, post-stroke   . Thrombocytopenia ([place])   . Acute blood loss anemia   . Brain aneurysm   . ICH (intracerebral hemorrhage) ([place]) 01/05/2020  . History of adenomatous polyp of colon 02/07/2016  . 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 Fissure in ano 11/11/2014  . Hemorrhoids 11/11/2014  . Constipation 11/19/2013  . GERD (gastroesophageal reflux disease) 03/24/2013  . Irritable bowel syndrome with diarrhea 03/24/2013  . Herpes zoster 05/14/2005  . Abnormal Pap smear of cervix 05/15/1983  . History of cervical dysplasia 05/15/1983   [name], [place], CCC-SLP Speech-Language Pathologist  Aliene Altes 05/10/2020, 1:37 PM  [employer] MAIN [facility] SERVICES [address] [place], [place], [address] Phone: [phone]   Fax:  [phone]   Name: Erika Stanton MRN: [id number] Date of Birth: [birth year]

## 2020-05-11 ENCOUNTER — Other Ambulatory Visit: Payer: Self-pay

## 2020-05-11 ENCOUNTER — Ambulatory Visit
Admission: RE | Admit: 2020-05-11 | Discharge: 2020-05-11 | Disposition: A | Discharge status: Home / Self Care | Payer: BC Managed Care – PPO | Source: Ambulatory Visit | Attending: Neurosurgery | Admitting: Neurosurgery

## 2020-05-11 DIAGNOSIS — I609 Nontraumatic subarachnoid hemorrhage, unspecified: Secondary | ICD-10-CM | POA: Insufficient documentation

## 2020-05-12 ENCOUNTER — Ambulatory Visit: Payer: BC Managed Care – PPO | Admitting: Physical Therapy

## 2020-05-12 ENCOUNTER — Ambulatory Visit: Payer: BC Managed Care – PPO | Admitting: Occupational Therapy

## 2020-05-12 ENCOUNTER — Encounter: Payer: Self-pay | Admitting: Occupational Therapy

## 2020-05-12 ENCOUNTER — Ambulatory Visit: Payer: BC Managed Care – PPO | Admitting: Speech Pathology

## 2020-05-12 DIAGNOSIS — R2681 Unsteadiness on feet: Secondary | ICD-10-CM

## 2020-05-12 DIAGNOSIS — R278 Other lack of coordination: Secondary | ICD-10-CM

## 2020-05-12 DIAGNOSIS — R2689 Other abnormalities of gait and mobility: Secondary | ICD-10-CM

## 2020-05-12 DIAGNOSIS — M6281 Muscle weakness (generalized): Secondary | ICD-10-CM | POA: Diagnosis not present

## 2020-05-12 DIAGNOSIS — R41841 Cognitive communication deficit: Secondary | ICD-10-CM

## 2020-05-12 DIAGNOSIS — I69159 Hemiplegia and hemiparesis following nontraumatic intracerebral hemorrhage affecting unspecified side: Secondary | ICD-10-CM

## 2020-05-12 NOTE — Therapy (Signed)
[place] MAIN [facility] SERVICES [address] [place], [place], [address] Phone: [phone]   Fax:  [phone]  Speech Language Pathology Treatment  Patient Details  Name: Erika Stanton MRN: [id number] Date of Birth: [birth year] Referring Provider (SLP): Alger Simons, MD   Encounter Date: 05/12/2020   End of Session - 05/12/20 1249    Visit Number 18    Number of Visits 33    Date for SLP Re-Evaluation 07/09/20    Authorization - Visit Number 8    Progress Note Due on Visit 10    SLP Start Time 1100    SLP Stop Time  1146    SLP Time Calculation (min) 46 min    Activity Tolerance Patient tolerated treatment well           Past Medical History:  Diagnosis Date  . Aneurysm ([place])   . Melena     Past Surgical History:  Procedure Laterality Date  . CHOLECYSTECTOMY    . COLONOSCOPY  11/2017   at [facility]. no recurrent polyps.  suggest repeat surveillance study 11/2022.    Marland Kitchen COLONOSCOPY W/ POLYPECTOMY  06/2014   Dr Roxy Manns at [facility].  3 adenomatoous polyps, anal fissure.    . ESOPHAGOGASTRODUODENOSCOPY (EGD) WITH PROPOFOL N/A 01/27/2020   Procedure: ESOPHAGOGASTRODUODENOSCOPY (EGD) WITH PROPOFOL;  Surgeon: Mauri Pole, MD;  Location: [place] [facility];  Service: Endoscopy;  Laterality: N/A;  . IR 3D INDEPENDENT WKST  01/06/2020  . IR ANGIO INTRA EXTRACRAN SEL INTERNAL CAROTID BILAT MOD SED  01/06/2020  . IR ANGIO VERTEBRAL SEL VERTEBRAL UNI L MOD SED  01/06/2020  . IR ANGIOGRAM FOLLOW UP STUDY  01/06/2020  . IR ANGIOGRAM FOLLOW UP STUDY  01/06/2020  . IR ANGIOGRAM FOLLOW UP STUDY  01/06/2020  . IR ANGIOGRAM FOLLOW UP STUDY  01/06/2020  . IR ANGIOGRAM FOLLOW UP STUDY  01/06/2020  . IR ANGIOGRAM FOLLOW UP STUDY  01/06/2020  . IR ANGIOGRAM FOLLOW UP STUDY  01/06/2020  . IR ANGIOGRAM FOLLOW UP STUDY  01/06/2020  . IR ANGIOGRAM FOLLOW UP STUDY  01/06/2020  . IR ANGIOGRAM FOLLOW UP STUDY  01/06/2020  . IR NEURO EACH ADD'L AFTER BASIC UNI RIGHT (MS)   01/06/2020  . IR TRANSCATH/EMBOLIZ  01/06/2020  . RADIOLOGY WITH ANESTHESIA N/A 01/06/2020   Procedure: IR WITH ANESTHESIA FOR ANEURYSM;  Surgeon: Consuella Lose, MD;  Location: [place];  Service: Radiology;  Laterality: N/A;  . TUBAL LIGATION      There were no vitals filed for this visit.   Subjective Assessment - 05/12/20 1238    Subjective "We talked about it, and we are going to get my whiteboard."    Currently in Pain? Yes    Pain Score 4     Pain Location Arm                 ADULT SLP TREATMENT - 05/12/20 1100      General Information   Behavior/Cognition Alert;Cooperative;Pleasant mood    HPI Pt presented with headache and lt hemiplegia on 01-05-20. 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 Stent and embolization of rt ICA on 01-06-20. Pt rec'd ST on CIR for cognition and swallowing and was d/c'd on dys III/thin with supervision level cues for swallow precautions. Jacqlyn was d/c'd 02-23-20.  Treatment Provided   Treatment provided Cognitive-Linquistic      Pain Assessment   Pain Assessment No/denies pain      Cognitive-Linquistic Treatment   Treatment focused on Cognition    Skilled Treatment Patient reports reviewing/planning schedule with her husband since last ST visit. They decided on using a whiteboard at home. They plan to work on setting phone alert reminders for exercises over the weekend. Patient got ice for her glass/helped set table. They also discussed tasks pt can help with more often at home, such as [place]. Targeted alternating attention and executive function in functional task: planning meal for Saturday and shopping list for [employer]. Patient used Water engineer to scan and search for relevant coupons with occasional min cues for attention to detail, impulsivity. Pt indicated  she would like to resume meal planning at home.      Assessment / Recommendations / Plan   Plan Continue with current plan of care      Progression Toward Goals   Progression toward goals Progressing toward goals            SLP Education - 05/12/20 1248    Education Details functional cognitive activities for home    Person(s) Educated Patient    Methods Explanation;Handout    Comprehension Verbalized understanding            SLP Short Term Goals - 05/10/20 1333      SLP SHORT TERM GOAL #1   Title pt to complete cognitive linguistic testing    Period --   or 4 total sessions   Status Achieved      SLP SHORT TERM GOAL #2   Title pt will demo emergent awareness 100% with min complex therapy tasks x3 visits    Baseline 03-30-20;    Status Partially Met      SLP SHORT TERM GOAL #3   Title pt will demo appriopriate aspiration precuations with suggested POs x3 sessions    Status Deferred      SLP SHORT TERM GOAL #4   Title pt will solve household probelms using functional solutions x3 sessions    Baseline 03-30-20; 04-11-20    Status Achieved      SLP SHORT TERM GOAL #5   Title pt will demo selective attention (internal distraction) to a simple functional task for 10 minutes with rare min A back to task in 3 sessions    Baseline 03-28-20    Period Weeks    Status Achieved      Additional Short Term Goals   Additional Short Term Goals Yes      SLP SHORT TERM GOAL #6   Title Patient will preplan daily schedule 5/7 days.    Time 4    Period Weeks    Status New            SLP Long Term Goals - 05/10/20 1335      SLP LONG TERM GOAL #1   Title pt will demo anticipatory awareness by double checking all work with a non-verbal cue x3 sessions    Time 8    Period Weeks   or 17 total sessions, for all LTGs   Status Partially Met      SLP LONG TERM GOAL #2   Title pt will follow swallow precautions with POs with modified independence x3 sessions    Status  Deferred      SLP LONG TERM GOAL #3   Title pt will demo [facility] skills  in solving min-mod complex problems in WNL amount of time with modified independence (double checking answers , etc)    Time 8    Period Weeks    Status Partially Met      SLP LONG TERM GOAL #4   Title pt will demo simple alternating attention in functional cognitive linguistic tasks in 3 sessions    Time 8    Period Weeks    Status Partially Met      SLP LONG TERM GOAL #5   Title Patient will use schedule to complete 3 pre-planned tasks with min cues from spouse.    Time 8    Period Weeks    Status New            Plan - 05/12/20 1249    Clinical Impression Statement Catelynn has improved activity participation outside of therapy since last visit, and expresses interest and desire in assisting with household planning and chores. She required fewer cues/encouragement for writing in functional activity (listmaking) today. Continue skilled ST to maximize independence, cognitive function, and participation in daily activities.    Speech Therapy Frequency 2x / week    Duration 8 weeks    Treatment/Interventions Aspiration precaution training;Pharyngeal strengthening exercises;Compensatory techniques;Diet toleration management by SLP;Trials of upgraded texture/liquids;Cueing hierarchy;Cognitive reorganization;Internal/external aids;Patient/family education;SLP instruction and feedback;Functional tasks    Potential to Achieve Goals Good    Consulted and Agree with Plan of Care Patient;Family member/caregiver           Patient will benefit from skilled therapeutic intervention in order to improve the following deficits and impairments:   Cognitive communication deficit    Problem List Patient Active Problem List   Diagnosis Date Noted  . Dyspareunia in female 04/05/2020  . History of abnormal cervical Pap smear 03/10/2020  . GIB (gastrointestinal bleeding) 02/28/2020  . Elevated BUN   . Prediabetes   . 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 Cerebral vasospasm   . Sinus tachycardia   . Dysphagia, post-stroke   . Thrombocytopenia ([place])   . Acute blood loss anemia   . Brain aneurysm   . ICH (intracerebral hemorrhage) ([place]) 01/05/2020  . History of adenomatous polyp of colon 02/07/2016  . 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 Fissure in ano 11/11/2014  . Hemorrhoids 11/11/2014  . Constipation 11/19/2013  . GERD (gastroesophageal reflux disease) 03/24/2013  . Irritable bowel syndrome with diarrhea 03/24/2013  . Herpes zoster 05/14/2005  . Abnormal Pap smear of cervix 05/15/1983  . History of cervical dysplasia 05/15/1983   [name], [place], [place] E Hannalee Castor 05/12/2020, 12:52 PM  [place] MAIN [facility] SERVICES [address] [place], [place], [address] Phone: [phone]   Fax:  [phone]   Name: LAKEYTA VANDENHEUVEL MRN: [id number] Date of Birth: 08-01-1955

## 2020-05-12 NOTE — Therapy (Signed)
[place] MAIN [facility] SERVICES [address] [place], [place], [address] Phone: [phone]   Fax:  [phone]  Physical Therapy Treatment  Patient Details  Name: Erika Stanton MRN: [id number] Date of Birth: 1956/03/31 Referring Provider (PT): Alger Simons, MD   Encounter Date: 05/12/2020   PT End of Session - 05/12/20 0934    Visit Number 23    Number of Visits 25    Date for PT Re-Evaluation 0000000   60 day cert, 30 day poc   Authorization Type [employer] State    PT Start Time 0930    PT Stop Time 1015    PT Time Calculation (min) 45 min    Equipment Utilized During Treatment Gait belt    Activity Tolerance Patient tolerated treatment well    Behavior During Therapy [facility] for tasks assessed/performed           Past Medical History:  Diagnosis Date  . Aneurysm ([place])   . Melena     Past Surgical History:  Procedure Laterality Date  . CHOLECYSTECTOMY    . COLONOSCOPY  11/2017   at [facility]. no recurrent polyps.  suggest repeat surveillance study 11/2022.    Marland Kitchen COLONOSCOPY W/ POLYPECTOMY  06/2014   Dr Roxy Manns at [facility].  3 adenomatoous polyps, anal fissure.    . ESOPHAGOGASTRODUODENOSCOPY (EGD) WITH PROPOFOL N/A 01/27/2020   Procedure: ESOPHAGOGASTRODUODENOSCOPY (EGD) WITH PROPOFOL;  Surgeon: Mauri Pole, MD;  Location: [place] [facility];  Service: Endoscopy;  Laterality: N/A;  . IR 3D INDEPENDENT WKST  01/06/2020  . IR ANGIO INTRA EXTRACRAN SEL INTERNAL CAROTID BILAT MOD SED  01/06/2020  . IR ANGIO VERTEBRAL SEL VERTEBRAL UNI L MOD SED  01/06/2020  . IR ANGIOGRAM FOLLOW UP STUDY  01/06/2020  . IR ANGIOGRAM FOLLOW UP STUDY  01/06/2020  . IR ANGIOGRAM FOLLOW UP STUDY  01/06/2020  . IR ANGIOGRAM FOLLOW UP STUDY  01/06/2020  . IR ANGIOGRAM FOLLOW UP STUDY  01/06/2020  . IR ANGIOGRAM FOLLOW UP STUDY  01/06/2020  . IR ANGIOGRAM FOLLOW UP STUDY  01/06/2020  . IR ANGIOGRAM FOLLOW UP STUDY  01/06/2020  . IR ANGIOGRAM FOLLOW UP STUDY  01/06/2020  . IR  ANGIOGRAM FOLLOW UP STUDY  01/06/2020  . IR NEURO EACH ADD'L AFTER BASIC UNI RIGHT (MS)  01/06/2020  . IR TRANSCATH/EMBOLIZ  01/06/2020  . RADIOLOGY WITH ANESTHESIA N/A 01/06/2020   Procedure: IR WITH ANESTHESIA FOR ANEURYSM;  Surgeon: Consuella Lose, MD;  Location: [place];  Service: Radiology;  Laterality: N/A;  . TUBAL LIGATION      There were no vitals filed for this visit.   Subjective Assessment - 05/12/20 0933    Subjective Patient denies of any new symptoms or pain since last therapy session. She denies of any falls or injuries.    Patient is accompained by: Family member    Limitations Walking;Standing;House hold activities    How long can you walk comfortably? Did a little walking yesterday up/down the hallway    Patient Stated Goals Use my right hand like I used to.    Currently in Pain? No/denies    Pain Onset More than a month ago    Pain Onset More than a month ago           There Ex:  NuStep x L1 x 5 mins  Supine SLR hip flexion 2 x 10 BLE, with 2#; Supine straight leg hip abduction 2 x 10 BLE, with 2#   Hooklying marching  2 x 10 BLE with 2#; Hooklying clams 2 x 10 BLE, with GTB; Hooklying bridges with arms across chest 2 x 10; Hooklying bridges followed with clams with GTB  2 x 10;   Seated LAQ with 3# ankle weights x 20 BLE;  Sit to stand from regular height chair without UE support x 10, x 7, x 5  Gait training: ambulated on level surface for 800 feet       PT Short Term Goals - 04/22/20 1957      PT SHORT TERM GOAL #1   Title STGs=LTGs             PT Long Term Goals - 04/22/20 1957      PT LONG TERM GOAL #1   Title Pt will be independent with progressive HEP for balance and strengthening to continue gains on own.    Baseline Pt reports that her PT exercises are going well and doing as instructed. Will continue to progress adding more core stabilization.    Time 4    Period Weeks    Status On-going    Target Date 05/22/20      PT LONG  TERM GOAL #2   Title Pt will increase FGA from 26/30 to 28/30 for improved balance and gait to decrease fall risk.    Baseline 03/23/20 19/30, 04/22/20 26/30    Time 4    Period Weeks    Status Revised    Target Date 05/22/20      PT LONG TERM GOAL #3   Title Pt will perform 5TSS in </=11 secs without UE support in order to indicate improved balance and functional strength    Baseline 16 seconds with L UE support 03/21/20, 04/22/20 13.54 sec without hands from mat    Time 4    Period Weeks    Status Revised    Target Date 05/22/20      PT LONG TERM GOAL #4   Title Pt will ambulate >1000' on varied surfaces independently with no LOB for improved community ambulation without AD.    Baseline 1050' including >300 feet of grass surface with SBA 03/21/20; we worked on ramp and curb also. 04/22/20 1150' mod I/supervision on varied surfaces inside due to cold.    Time 4    Period Weeks    Status On-going    Target Date 05/22/20                 Plan - 05/12/20 1100    Clinical Impression Statement Patient completed strengthening exercises with increased muscle fatigue with repetitions due to decreased activity capacity. Therapist provided verbal cues for proper placement of hips and knees when completing bridges to avoid compensation. Patient ambulated on level surface for 800 feet with a decrease hip flexion and knee flexion during swing phase on LLE causing a mild shuffling gait pattern. Patient reports that her bilateral knee pain is what limits her from ambulating a longer distance. Patient will continue to benefit from skilled physical therapy to improve generalized strength, ROM, and capacity for functional activity.    Personal Factors and Comorbidities Age;Comorbidity 1;Profession    Comorbidities B ICHs    Examination-Activity Limitations Bend;Carry;Lift;Locomotion Level;Reach Overhead;Squat;Stairs;Stand;Transfers    Examination-Participation Restrictions Cleaning;Community  Activity;Driving;Occupation;Yard Work;Laundry;Meal Prep    Stability/Clinical Decision Making Evolving/Moderate complexity    Rehab Potential Good    PT Frequency 2x / week   then 1x/week for 2 weeks   PT Duration 2 weeks    PT Treatment/Interventions  ADLs/Self Care Home Management;Aquatic Therapy;Electrical Stimulation;DME Instruction;Gait training;Stair training;Functional mobility training;Therapeutic activities;Therapeutic exercise;Balance training;Neuromuscular re-education;Cognitive remediation;Patient/family education;Orthotic Fit/Training;Manual techniques;Passive range of motion;Dry needling;Energy conservation;Vestibular;Visual/perceptual remediation/compensation    PT Next Visit Plan Recert was just completed for 2x/week for 2 weeks followed by 1x/week for 2 weeks for transfer to [facility]. Continue with dynamic gait and balance activities. Activities to try to incorporate more core and shoulder activation as able, LLE NMR. Pt does have chronic knee issues and is limited some due to that.    Consulted and Agree with Plan of Care Patient;Family member/caregiver           Patient will benefit from skilled therapeutic intervention in order to improve the following deficits and impairments:  Abnormal gait,Decreased activity tolerance,Decreased balance,Decreased cognition,Decreased endurance,Decreased knowledge of use of DME,Decreased mobility,Decreased strength,Impaired perceived functional ability,Impaired flexibility,Impaired sensation,Postural dysfunction,Improper body mechanics,Impaired vision/preception,Impaired UE functional use,Impaired tone  Visit Diagnosis: Muscle weakness (generalized)  Other lack of coordination  Unsteadiness on feet  Other abnormalities of gait and mobility     Problem List Patient Active Problem List   Diagnosis Date Noted  . Dyspareunia in female 04/05/2020  . History of abnormal cervical Pap smear 03/10/2020  . GIB (gastrointestinal bleeding)  02/28/2020  . Elevated BUN   . Prediabetes   . 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[place]) 01/20/2020  . Cerebral vasospasm   . Sinus tachycardia   . Dysphagia, post-stroke   . Thrombocytopenia ([place])   . Acute blood loss anemia   . Brain aneurysm   . ICH (intracerebral hemorrhage) ([place]) 01/05/2020  . History of adenomatous polyp of colon 02/07/2016  . 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 Fissure in ano 11/11/2014  . Hemorrhoids 11/11/2014  . Constipation 11/19/2013  . GERD (gastroesophageal reflux disease) 03/24/2013  . Irritable bowel syndrome with diarrhea 03/24/2013  . Herpes zoster 05/14/2005  . Abnormal Pap smear of cervix 05/15/1983  . History of cervical dysplasia 05/15/1983   Karl Luke PT, DPT Netta Corrigan 05/12/2020, 11:02 AM  [place] MAIN [facility] SERVICES [address] [place], [place], [address] Phone: [phone]   Fax:  [phone]  Name: Erika Stanton MRN: [id number] Date of Birth: 1956-01-28

## 2020-05-12 NOTE — Therapy (Signed)
[place] MAIN [facility] SERVICES [address] [place], [place], [address] Phone: [phone]   Fax:  [phone]  Occupational Therapy Treatment  Patient Details  Name: Erika Stanton MRN: [id number] Date of Birth: 12-05-1955 Referring Provider (OT): Dr. Naaman Plummer   Encounter Date: 05/12/2020   OT End of Session - 05/12/20 1024    Visit Number 22    Number of Visits 26    Date for OT Re-Evaluation 06/08/20    Authorization Time [employer] [place]    OT Start Time 1015    Activity Tolerance Patient tolerated treatment well    Behavior During Therapy [facility] for tasks assessed/performed           Past Medical History:  Diagnosis Date  . Aneurysm ([place])   . Melena     Past Surgical History:  Procedure Laterality Date  . CHOLECYSTECTOMY    . COLONOSCOPY  11/2017   at [facility]. no recurrent polyps.  suggest repeat surveillance study 11/2022.    Marland Kitchen COLONOSCOPY W/ POLYPECTOMY  06/2014   Dr Roxy Manns at [facility].  3 adenomatoous polyps, anal fissure.    . ESOPHAGOGASTRODUODENOSCOPY (EGD) WITH PROPOFOL N/A 01/27/2020   Procedure: ESOPHAGOGASTRODUODENOSCOPY (EGD) WITH PROPOFOL;  Surgeon: Mauri Pole, MD;  Location: [place] [facility];  Service: Endoscopy;  Laterality: N/A;  . IR 3D INDEPENDENT WKST  01/06/2020  . IR ANGIO INTRA EXTRACRAN SEL INTERNAL CAROTID BILAT MOD SED  01/06/2020  . IR ANGIO VERTEBRAL SEL VERTEBRAL UNI L MOD SED  01/06/2020  . IR ANGIOGRAM FOLLOW UP STUDY  01/06/2020  . IR ANGIOGRAM FOLLOW UP STUDY  01/06/2020  . IR ANGIOGRAM FOLLOW UP STUDY  01/06/2020  . IR ANGIOGRAM FOLLOW UP STUDY  01/06/2020  . IR ANGIOGRAM FOLLOW UP STUDY  01/06/2020  . IR ANGIOGRAM FOLLOW UP STUDY  01/06/2020  . IR ANGIOGRAM FOLLOW UP STUDY  01/06/2020  . IR ANGIOGRAM FOLLOW UP STUDY  01/06/2020  . IR ANGIOGRAM FOLLOW UP STUDY  01/06/2020  . IR ANGIOGRAM FOLLOW UP STUDY  01/06/2020  . IR NEURO EACH ADD'L AFTER BASIC UNI RIGHT (MS)  01/06/2020  . IR TRANSCATH/EMBOLIZ   01/06/2020  . RADIOLOGY WITH ANESTHESIA N/A 01/06/2020   Procedure: IR WITH ANESTHESIA FOR ANEURYSM;  Surgeon: Consuella Lose, MD;  Location: [place];  Service: Radiology;  Laterality: N/A;  . TUBAL LIGATION      There were no vitals filed for this visit.   Subjective Assessment - 05/12/20 1015    Subjective  Pt reports she has been trying to work on stretching at home, denies any changes, "no breakthroughs"    Pertinent History ICH 01/06/20, Aneurysm. PMH: OA bilateral knees and hands    Patient Stated Goals Pt would like to be as independent as possible.    Currently in Pain? Yes    Pain Score 4     Pain Location Arm    Pain Orientation Right    Pain Descriptors / Indicators Aching    Pain Type Chronic pain            Moist heat for 5 mins to right shoulder and right hand prior to ROM exercises to decrease pain and increase motion.  Scapular mobs in sitting for scapular retraction, protraction, elevation followed by [facility] for each exercise, manual assist from therapist for assisted motions.    Agreeable this date to try exercises in supine for ROM.  Sit to supine with min assist to manage right UE, pt requested a  pillow to support arm.  Thumb pain on right if therapist pushed against thumb area, effort to avoid this area during exercises to decrease pain, patient remains guarded with movements but able to relax more today than last session therapist worked with patient in sitting.  Gentle PROM to UE for shoulder flexion to 50 degrees today, ABD to 60 degrees followed by [facility] at [facility], patient able to demonstrate shoulder flexion 25 degrees and ABD 30 degrees.  PROM of right elbow with tightness noted at end ranges of extension and flexion, followed by AAROM with gentle resistance with elbow extension today.  PROM to digits with arm in extension followed by AAROM for finger extension, increased movement in index and middle fingers less in ring and small fingers.  Supine to sit with  min assist  Response to tx:   Patient performed well today with attempts at ROM and exercises in supine today.  She had been hesistant to perform in this position but was able to tolerate passive and more active motion in supine and with shoulder supported.  Continue to encourage patient to participate with exercises at home with husband.  She reports he has been less aggressive with the exercises over the last few days after instruction from therapist.  Pt had MRI last night and awaiting results from MD.  Will continue to work towards goals in plan of care to improve right UE ROM and working towards increased functional use of right hand in everyday tasks.                      OT Education - 05/12/20 1024    Education Details ROM, pain control, strength    Person(s) Educated Patient    Methods Explanation;Demonstration;Verbal cues;Handout    Comprehension Verbalized understanding;Returned demonstration;Verbal cues required            OT Short Term Goals - 05/09/20 1031      OT SHORT TERM GOAL #3   Title Pt will donn shirt and pants with min A    Baseline Pt. is limited    Time 4    Period Weeks    Status On-going    Target Date 06/08/20      OT SHORT TERM GOAL #4   Title Pt will demonstrate 30 A/ROM shoulder flexion for RUE with pain less than or equal to 4/10.    Baseline Pt. presents with limited right shoulder flexion with pain between 2-4    Time 4    Period Weeks    Status On-going    Target Date 06/08/20      OT SHORT TERM GOAL #5   Title Pt will  demonstrate improved LUE fine motor coordination for ADLs as evidenced by decreasing 9 hole peg test score to 45 secs or less.    Baseline Left FMC 41 sec visa the 9 hole peg test    Time 6    Period Weeks      OT SHORT TERM GOAL #6   Title Pt/ caregiver will verbalize understanding of RUE positioning and edema control techniques to minimize pain and risk for injury    Baseline Pt. uses a pillow, and has a  resting hand slpint,    Time 4    Period Weeks    Status On-going    Target Date 06/08/20      OT SHORT TERM GOAL #7   Title Pt will demonstrate at least 25% finger flexion/ extension in RUE in  prep for functional use.    Baseline Limited PROM, and active digit flexion, and extension    Time 4    Period Weeks    Status On-going    Target Date 06/08/20             OT Long Term Goals - 05/09/20 1039      OT LONG TERM GOAL #1   Title Pt will perform  all basic ADLs with supervision    Baseline Pt. continues to be limited    Time 8    Period Weeks    Status On-going    Target Date 06/08/20      OT LONG TERM GOAL #2   Title Pt will demonstrate 45* RUE shoulder flexion in prep for functional reach with pain less than or equal to 3/10.    Baseline Pt. presents with limited right shoulder flexion with pain 2-4.    Time 8    Period Weeks    Status On-going    Target Date 06/08/20      OT LONG TERM GOAL #3   Title Pt will use RUE as a stabilizer/ gross A at least 30 % of the time for ADLs/ IADLs.    Baseline Pt. is unalbe to use her right hand as agross assist secondary to limited ROM, and pain.    Time 12    Period Weeks    Status On-going    Target Date 06/08/20      OT LONG TERM GOAL #4   Title Pt will increase LUE grip strength to 25 lbs or greater for increased ease with ADLs.    Baseline Pt. presents with decreased left grip stregth    Time 8    Period Weeks    Status On-going    Target Date 06/08/20      OT LONG TERM GOAL #5   Title Pt will perform simple home management/ snack prep with min A.    Baseline Pt. has difficulty    Time 8    Period Weeks    Status On-going    Target Date 06/08/20      OT LONG TERM GOAL #6   Title Pt will demonstrate ability to grasp/ release a cup 2/3 trials with RUE.    Baseline Pt. is unable to grasp/release a cup.    Time 8    Period Weeks    Status On-going    Target Date 06/08/20      OT LONG TERM GOAL #7   Title Pt  will safely navigate a busy environment and locate items with supervision and  90% or greater accuracy without bumping into items.    Baseline TBA    Time 8    Period Weeks    Status On-going    Target Date 06/08/20      OT LONG TERM GOAL #8   Title I with updated HEP.    Baseline Pt. requires assist, and cuing for HEP.    Time 8    Period Weeks    Status On-going    Target Date 06/08/20                 Plan - 05/12/20 1024    Clinical Impression Statement Patient performed well today with attempts at ROM and exercises in supine today.  She had been hesistant to perform in this position but was able to tolerate passive and more active motion in supine and with shoulder supported.  Continue to  encourage patient to participate with exercises at home with husband.  She reports he has been less aggressive with the exercises over the last few days after instruction from therapist.  Pt had MRI last night and awaiting results from MD.  Will continue to work towards goals in plan of care to improve right UE ROM and working towards increased functional use of right hand in everyday tasks.    OT Occupational Profile and History Detailed Assessment- Review of Records and additional review of physical, cognitive, psychosocial history related to current functional performance    Occupational performance deficits (Please refer to evaluation for details): ADL's;IADL's;Rest and Sleep;Work;Play;Leisure    Body Structure / Function / Physical Skills ADL;Balance;Coordination;Decreased knowledge of precautions;Decreased knowledge of use of DME;Dexterity;Edema;Mobility;Tone;Strength;IADL;Sensation;GMC;Gait;ROM;FMC;Flexibility;Pain;Vision;UE functional use;Endurance    Cognitive Skills Attention;Perception;Problem Solve;Safety Awareness;Thought    Rehab Potential Good    Clinical Decision Making Several treatment options, min-mod task modification necessary    Comorbidities Affecting Occupational  Performance: None    Modification or Assistance to Complete Evaluation  Min-Moderate modification of tasks or assist with assess necessary to complete eval    OT Frequency 3x / week    OT Duration 8 weeks    OT Treatment/Interventions Self-care/ADL training;Ultrasound;Energy conservation;Visual/perceptual remediation/compensation;Patient/family education;DME and/or AE instruction;Aquatic Therapy;Paraffin;Gait Training;Passive range of motion;Balance training;Fluidtherapy;Electrical Stimulation;Functional Mobility Training;Splinting;Moist Heat;Therapeutic exercise;Manual Therapy;Cognitive remediation/compensation;Manual lymph drainage;Neuromuscular education;Coping strategies training    Consulted and Agree with Plan of Care Patient;Family member/caregiver    Family Member Consulted Spose present during the session.           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 Dyspareunia in female 04/05/2020  . History of abnormal cervical Pap smear 03/10/2020  . GIB (gastrointestinal bleeding) 02/28/2020  . Elevated BUN   . Prediabetes   . 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 Cerebral vasospasm   . Sinus tachycardia   . Dysphagia, post-stroke   . Thrombocytopenia (HCC)   . Acute blood loss anemia    . Brain aneurysm   . ICH (intracerebral hemorrhage) (HCC) 01/05/2020  . History of adenomatous polyp of colon 02/07/2016  . 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 Fissure in ano 11/11/2014  . Hemorrhoids 11/11/2014  . Constipation 11/19/2013  . GERD (gastroesophageal reflux disease) 03/24/2013  . Irritable bowel syndrome with diarrhea 03/24/2013  . Herpes zoster 05/14/2005  . Abnormal Pap smear of cervix 05/15/1983  . History of cervical dysplasia 05/15/1983   Kerrie Buffalo, OTR/L, CLT  Phyllistine Domingos 05/12/2020, 1:45 PM  [place] [facility] MAIN [facility] SERVICES [address] [place], [place], [address] Phone: [phone]   Fax:  [phone]  Name: Erika Stanton MRN: [id number] Date of Birth: 06/17/1955

## 2020-05-16 ENCOUNTER — Ambulatory Visit (HOSPITAL_COMMUNITY): Payer: BC Managed Care – PPO

## 2020-05-17 ENCOUNTER — Encounter: Payer: Self-pay | Admitting: Occupational Therapy

## 2020-05-17 ENCOUNTER — Ambulatory Visit: Payer: BC Managed Care – PPO | Admitting: Occupational Therapy

## 2020-05-17 ENCOUNTER — Ambulatory Visit: Payer: BC Managed Care – PPO | Admitting: Speech Pathology

## 2020-05-17 ENCOUNTER — Other Ambulatory Visit: Payer: Self-pay

## 2020-05-17 ENCOUNTER — Ambulatory Visit: Payer: BC Managed Care – PPO | Attending: Physical Medicine & Rehabilitation | Admitting: Physical Therapy

## 2020-05-17 DIAGNOSIS — R278 Other lack of coordination: Secondary | ICD-10-CM | POA: Insufficient documentation

## 2020-05-17 DIAGNOSIS — M6281 Muscle weakness (generalized): Secondary | ICD-10-CM | POA: Insufficient documentation

## 2020-05-17 DIAGNOSIS — R2689 Other abnormalities of gait and mobility: Secondary | ICD-10-CM | POA: Diagnosis present

## 2020-05-17 DIAGNOSIS — R41841 Cognitive communication deficit: Secondary | ICD-10-CM | POA: Diagnosis present

## 2020-05-17 DIAGNOSIS — I607 Nontraumatic subarachnoid hemorrhage from unspecified intracranial artery: Secondary | ICD-10-CM | POA: Insufficient documentation

## 2020-05-17 DIAGNOSIS — I69159 Hemiplegia and hemiparesis following nontraumatic intracerebral hemorrhage affecting unspecified side: Secondary | ICD-10-CM | POA: Insufficient documentation

## 2020-05-17 DIAGNOSIS — R2681 Unsteadiness on feet: Secondary | ICD-10-CM | POA: Insufficient documentation

## 2020-05-17 NOTE — Therapy (Signed)
[place] MAIN [facility] SERVICES [address] [place], [place], [address] Phone: [phone]   Fax:  [phone]  Speech Language Pathology Treatment  Patient Details  Name: Erika Stanton MRN: [id number] Date of Birth: [birth year] Referring Provider (SLP): Alger Simons, MD   Encounter Date: 05/17/2020   End of Session - 05/17/20 1441    Visit Number 19    Number of Visits 33    Date for SLP Re-Evaluation 07/09/20    Authorization - Visit Number 9    Progress Note Due on Visit 10    SLP Start Time 8828    SLP Stop Time  1155    SLP Time Calculation (min) 53 min    Activity Tolerance Patient tolerated treatment well           Past Medical History:  Diagnosis Date  . Aneurysm ([place])   . Melena     Past Surgical History:  Procedure Laterality Date  . CHOLECYSTECTOMY    . COLONOSCOPY  11/2017   at [facility]. no recurrent polyps.  suggest repeat surveillance study 11/2022.    Marland Kitchen COLONOSCOPY W/ POLYPECTOMY  06/2014   Dr Roxy Manns at [facility].  3 adenomatoous polyps, anal fissure.    . ESOPHAGOGASTRODUODENOSCOPY (EGD) WITH PROPOFOL N/A 01/27/2020   Procedure: ESOPHAGOGASTRODUODENOSCOPY (EGD) WITH PROPOFOL;  Surgeon: Mauri Pole, MD;  Location: [place] [facility];  Service: Endoscopy;  Laterality: N/A;  . IR 3D INDEPENDENT WKST  01/06/2020  . IR ANGIO INTRA EXTRACRAN SEL INTERNAL CAROTID BILAT MOD SED  01/06/2020  . IR ANGIO VERTEBRAL SEL VERTEBRAL UNI L MOD SED  01/06/2020  . IR ANGIOGRAM FOLLOW UP STUDY  01/06/2020  . IR ANGIOGRAM FOLLOW UP STUDY  01/06/2020  . IR ANGIOGRAM FOLLOW UP STUDY  01/06/2020  . IR ANGIOGRAM FOLLOW UP STUDY  01/06/2020  . IR ANGIOGRAM FOLLOW UP STUDY  01/06/2020  . IR ANGIOGRAM FOLLOW UP STUDY  01/06/2020  . IR ANGIOGRAM FOLLOW UP STUDY  01/06/2020  . IR ANGIOGRAM FOLLOW UP STUDY  01/06/2020  . IR ANGIOGRAM FOLLOW UP STUDY  01/06/2020  . IR ANGIOGRAM FOLLOW UP STUDY  01/06/2020  . IR NEURO EACH ADD'L AFTER BASIC UNI RIGHT (MS)   01/06/2020  . IR TRANSCATH/EMBOLIZ  01/06/2020  . RADIOLOGY WITH ANESTHESIA N/A 01/06/2020   Procedure: IR WITH ANESTHESIA FOR ANEURYSM;  Surgeon: Consuella Lose, MD;  Location: [place];  Service: Radiology;  Laterality: N/A;  . TUBAL LIGATION      There were no vitals filed for this visit.   Subjective Assessment - 05/17/20 1435    Subjective "I helped make the bed."    Currently in Pain? Yes    Pain Score 2     Pain Location Arm                 ADULT SLP TREATMENT - 05/17/20 1102      General Information   Behavior/Cognition Alert;Cooperative;Pleasant mood    HPI Pt presented with headache and lt hemiplegia on 01-05-20. 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 Stent and embolization of rt ICA on 01-06-20. Pt rec'd ST on CIR for cognition and swallowing and was d/c'd on dys III/thin with supervision level cues for swallow precautions. Chelisa was d/c'd 02-23-20.      Treatment Provided   Treatment provided  Cognitive-Linquistic      Cognitive-Linquistic Treatment   Treatment focused on Cognition    Skilled Treatment Continues to make schedule at home with husband; she has not taken charge of the [place] litter yet but plans to. She feels keeping a regular schedule has been helpful. She and husband worked on meal planning together this week: "we're back to doing it like we used to." When SLP suggested pt could take on writing the meal plan or grocery list, she focused on "not being able to write." Required mod cues for problem solving about how she could do this in a different way. She used notes app on her phone to create a grocery list for a dish her husband will prepare this week; mod cues for impulsivity, error awareness, and occasional cues for initiation/task persistence. She forwarded this list in a text to her  husband. Pt agreed this would be a manageable format for her. Pt expressed interest in podcasts, so SLP assisted pt with downloading some podcasts about stroke/brain injury recovery. Encouraged pt to listen to these and discuss with her husband, as another cognitive activity for home.      Assessment / Recommendations / Plan   Plan Continue with current plan of care      Progression Toward Goals   Progression toward goals Progressing toward goals   slow but steady progress           SLP Education - 05/17/20 1441    Education Details adaptations to help her participate in planning    Person(s) Educated Patient    Methods Explanation    Comprehension Verbalized understanding            SLP Short Term Goals - 05/10/20 1333      SLP SHORT TERM GOAL #1   Title pt to complete cognitive linguistic testing    Period --   or 4 total sessions   Status Achieved      SLP SHORT TERM GOAL #2   Title pt will demo emergent awareness 100% with min complex therapy tasks x3 visits    Baseline 03-30-20;    Status Partially Met      SLP SHORT TERM GOAL #3   Title pt will demo appriopriate aspiration precuations with suggested POs x3 sessions    Status Deferred      SLP SHORT TERM GOAL #4   Title pt will solve household probelms using functional solutions x3 sessions    Baseline 03-30-20; 04-11-20    Status Achieved      SLP SHORT TERM GOAL #5   Title pt will demo selective attention (internal distraction) to a simple functional task for 10 minutes with rare min A back to task in 3 sessions    Baseline 03-28-20    Period Weeks    Status Achieved      Additional Short Term Goals   Additional Short Term Goals Yes      SLP SHORT TERM GOAL #6   Title Patient will preplan daily schedule 5/7 days.    Time 4    Period Weeks    Status New            SLP Long Term Goals - 05/10/20 1335      SLP LONG TERM GOAL #1   Title pt will demo anticipatory awareness by double checking all work  with a non-verbal cue x3 sessions    Time 8    Period Weeks   or 17 total sessions, for  all LTGs   Status Partially Met      SLP LONG TERM GOAL #2   Title pt will follow swallow precautions with POs with modified independence x3 sessions    Status Deferred      SLP LONG TERM GOAL #3   Title pt will demo [facility] skills in solving min-mod complex problems in WNL amount of time with modified independence (double checking answers , etc)    Time 8    Period Weeks    Status Partially Met      SLP LONG TERM GOAL #4   Title pt will demo simple alternating attention in functional cognitive linguistic tasks in 3 sessions    Time 8    Period Weeks    Status Partially Met      SLP LONG TERM GOAL #5   Title Patient will use schedule to complete 3 pre-planned tasks with min cues from spouse.    Time 8    Period Weeks    Status New            Plan - 05/17/20 1442    Clinical Impression Statement Anne-Marie is keeping a more consistent schedule and is working on increasing her participation in household activities/planning. She continues to require cues for error awareness, impulsivity, and at times inititation. Continue skilled ST to maximize independence, cognitive function, and participation in daily activities.    Speech Therapy Frequency 2x / week    Duration 8 weeks    Treatment/Interventions Aspiration precaution training;Pharyngeal strengthening exercises;Compensatory techniques;Diet toleration management by SLP;Trials of upgraded texture/liquids;Cueing hierarchy;Cognitive reorganization;Internal/external aids;Patient/family education;SLP instruction and feedback;Functional tasks    Potential to Achieve Goals Good    Consulted and Agree with Plan of Care Patient;Family member/caregiver           Patient will benefit from skilled therapeutic intervention in order to improve the following deficits and impairments:   Cognitive communication deficit    Problem List Patient Active Problem  List   Diagnosis Date Noted  . Dyspareunia in female 04/05/2020  . History of abnormal cervical Pap smear 03/10/2020  . GIB (gastrointestinal bleeding) 02/28/2020  . Elevated BUN   . Prediabetes   . 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[place]) 01/20/2020  . Cerebral vasospasm   . Sinus tachycardia   . Dysphagia, post-stroke   . Thrombocytopenia ([place])   . Acute blood loss anemia   . Brain aneurysm   . ICH (intracerebral hemorrhage) ([place]) 01/05/2020  . History of adenomatous polyp of colon 02/07/2016  . 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 Fissure in ano 11/11/2014  . Hemorrhoids 11/11/2014  . Constipation 11/19/2013  . GERD (gastroesophageal reflux disease) 03/24/2013  . Irritable bowel syndrome with diarrhea 03/24/2013  . Herpes zoster 05/14/2005  . Abnormal Pap smear of cervix 05/15/1983  . History of cervical dysplasia 05/15/1983   [name], [place], [place] E Herman Fiero 05/17/2020, 2:44 PM  [place] MAIN [facility] SERVICES [address] [place], [place], [address] Phone: [phone]   Fax:  [phone]   Name: AVERLEIGH SAVARY MRN: [id number] Date of Birth: Jul 13, 1955

## 2020-05-17 NOTE — Therapy (Signed)
[place] [facility] MAIN [facility] SERVICES [address] [place], [place], [address] Phone: [phone]   Fax:  [phone]  Physical Therapy Treatment  Patient Details  Name: Erika Stanton MRN: [id number] Date of Birth: 06/29/1955 Referring Provider (PT): Faith Rogue, MD   Encounter Date: 05/17/2020   PT End of Session - 05/17/20 1240    Visit Number 24    Number of Visits 25    Date for PT Re-Evaluation 06/21/20   60 day cert, 30 day poc   Authorization Type [employer] State    PT Start Time 0930    PT Stop Time 1015    PT Time Calculation (min) 45 min    Equipment Utilized During Treatment Gait belt    Activity Tolerance Patient tolerated treatment well    Behavior During Therapy [facility] for tasks assessed/performed           Past Medical History:  Diagnosis Date  . Aneurysm ([facility])   . Melena     Past Surgical History:  Procedure Laterality Date  . CHOLECYSTECTOMY    . COLONOSCOPY  11/2017   at [facility]. no recurrent polyps.  suggest repeat surveillance study 11/2022.    Marland Kitchen COLONOSCOPY W/ POLYPECTOMY  06/2014   Dr Karna Christmas at [facility].  3 adenomatoous polyps, anal fissure.    . ESOPHAGOGASTRODUODENOSCOPY (EGD) WITH PROPOFOL N/A 01/27/2020   Procedure: ESOPHAGOGASTRODUODENOSCOPY (EGD) WITH PROPOFOL;  Surgeon: Napoleon Form, MD;  Location: [facility];  Service: Endoscopy;  Laterality: N/A;  . IR 3D INDEPENDENT WKST  01/06/2020  . IR ANGIO INTRA EXTRACRAN SEL INTERNAL CAROTID BILAT MOD SED  01/06/2020  . IR ANGIO VERTEBRAL SEL VERTEBRAL UNI L MOD SED  01/06/2020  . IR ANGIOGRAM FOLLOW UP STUDY  01/06/2020  . IR ANGIOGRAM FOLLOW UP STUDY  01/06/2020  . IR ANGIOGRAM FOLLOW UP STUDY  01/06/2020  . IR ANGIOGRAM FOLLOW UP STUDY  01/06/2020  . IR ANGIOGRAM FOLLOW UP STUDY  01/06/2020  . IR ANGIOGRAM FOLLOW UP STUDY  01/06/2020  . IR ANGIOGRAM FOLLOW UP STUDY  01/06/2020  . IR ANGIOGRAM FOLLOW UP STUDY  01/06/2020  . IR ANGIOGRAM FOLLOW UP STUDY  01/06/2020  . IR  ANGIOGRAM FOLLOW UP STUDY  01/06/2020  . IR NEURO EACH ADD'L AFTER BASIC UNI RIGHT (MS)  01/06/2020  . IR TRANSCATH/EMBOLIZ  01/06/2020  . RADIOLOGY WITH ANESTHESIA N/A 01/06/2020   Procedure: IR WITH ANESTHESIA FOR ANEURYSM;  Surgeon: Lisbeth Renshaw, MD;  Location: [facility] OR;  Service: Radiology;  Laterality: N/A;  . TUBAL LIGATION      There were no vitals filed for this visit.   Subjective Assessment - 05/17/20 0939    Subjective Patient states her knees are sore and are bothering due to OA. She denies of any falls or injuries since last therapy session.    Patient is accompained by: Family member    Limitations Walking;Standing;House hold activities    How long can you walk comfortably? Did a little walking yesterday up/down the hallway    Patient Stated Goals Use my right hand like I used to.    Currently in Pain? Yes    Pain Score 3     Pain Location Knee    Pain Orientation Left;Right    Pain Descriptors / Indicators Aching    Pain Onset More than a month ago    Pain Onset More than a month ago            There Ex:  NuStep x L1 x 5 mins   Seated LAQ with #2.5 ankle weights x 20 BLE; Seated marches with #2.5 ankle weights x 30 BLE Seated crossovers hip abd/add with #2.5 ankle weights x 20 reps each leg Standing hip abduction with #2.5 ankle weights x 20 reps each leg Standing hamstring curls with #2.5 ankle weights x 20 reps each leg Standing hip flexion with #2.5 ankle weights x 20 reps each leg    Sit to stand from regular height chair without UE support x 10, x 7, x 5   KoreBalance:  [place]: bunny slope with LUE support x 3 reps x caught 14-16 fishes each attempt        PT Short Term Goals - 04/22/20 1957      PT SHORT TERM GOAL #1   Title STGs=LTGs             PT Long Term Goals - 04/22/20 1957      PT LONG TERM GOAL #1   Title Pt will be independent with progressive HEP for balance and strengthening to continue gains on own.    Baseline Pt  reports that her PT exercises are going well and doing as instructed. Will continue to progress adding more core stabilization.    Time 4    Period Weeks    Status On-going    Target Date 05/22/20      PT LONG TERM GOAL #2   Title Pt will increase FGA from 26/30 to 28/30 for improved balance and gait to decrease fall risk.    Baseline 03/23/20 19/30, 04/22/20 26/30    Time 4    Period Weeks    Status Revised    Target Date 05/22/20      PT LONG TERM GOAL #3   Title Pt will perform 5TSS in </=11 secs without UE support in order to indicate improved balance and functional strength    Baseline 16 seconds with L UE support 03/21/20, 04/22/20 13.54 sec without hands from mat    Time 4    Period Weeks    Status Revised    Target Date 05/22/20      PT LONG TERM GOAL #4   Title Pt will ambulate >1000' on varied surfaces independently with no LOB for improved community ambulation without AD.    Baseline 1050' including >300 feet of grass surface with SBA 03/21/20; we worked on ramp and curb also. 04/22/20 1150' mod I/supervision on varied surfaces inside due to cold.    Time 4    Period Weeks    Status On-going    Target Date 05/22/20                 Plan - 05/17/20 1241    Clinical Impression Statement Patient completed strengthening exercises with minimal increase in pain. She reports of difficulty with single leg exercises on RLE due to soreness from arthritis. Therapist provided verbal cues to maintain spine in neutral position when completing standing right hip exercises. Patient demonstrates decreaed weight bearing through RLE on KoreBalance and required constant verbal and visual cues to improve weight bearing equally to accomplish task. Patient will benefit from continued PT services to increase strength and mobility to improve patient's quality of life.    Personal Factors and Comorbidities Age;Comorbidity 1;Profession    Comorbidities B ICHs    Examination-Activity  Limitations Bend;Carry;Lift;Locomotion Level;Reach Overhead;Squat;Stairs;Stand;Transfers    Examination-Participation Restrictions Cleaning;Community Activity;Driving;Occupation;Yard Work;Laundry;Meal Prep    Stability/Clinical Decision Making Evolving/Moderate complexity  Rehab Potential Good    PT Frequency 2x / week   then 1x/week for 2 weeks   PT Duration 2 weeks    PT Treatment/Interventions ADLs/Self Care Home Management;Aquatic Therapy;Electrical Stimulation;DME Instruction;Gait training;Stair training;Functional mobility training;Therapeutic activities;Therapeutic exercise;Balance training;Neuromuscular re-education;Cognitive remediation;Patient/family education;Orthotic Fit/Training;Manual techniques;Passive range of motion;Dry needling;Energy conservation;Vestibular;Visual/perceptual remediation/compensation    PT Next Visit Plan Recert was just completed for 2x/week for 2 weeks followed by 1x/week for 2 weeks for transfer to [facility]. Continue with dynamic gait and balance activities. Activities to try to incorporate more core and shoulder activation as able, LLE NMR. Pt does have chronic knee issues and is limited some due to that.    Consulted and Agree with Plan of Care Patient;Family member/caregiver           Patient will benefit from skilled therapeutic intervention in order to improve the following deficits and impairments:  Abnormal gait,Decreased activity tolerance,Decreased balance,Decreased cognition,Decreased endurance,Decreased knowledge of use of DME,Decreased mobility,Decreased strength,Impaired perceived functional ability,Impaired flexibility,Impaired sensation,Postural dysfunction,Improper body mechanics,Impaired vision/preception,Impaired UE functional use,Impaired tone  Visit Diagnosis: Muscle weakness (generalized)  Other lack of coordination  Unsteadiness on feet  Other abnormalities of gait and mobility     Problem List Patient Active Problem List    Diagnosis Date Noted  . Dyspareunia in female 04/05/2020  . History of abnormal cervical Pap smear 03/10/2020  . GIB (gastrointestinal bleeding) 02/28/2020  . Elevated BUN   . Prediabetes   . 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 Cerebral vasospasm   . Sinus tachycardia   . Dysphagia, post-stroke   . Thrombocytopenia ([place])   . Acute blood loss anemia   . Brain aneurysm   . ICH (intracerebral hemorrhage) ([place]) 01/05/2020  . History of adenomatous polyp of colon 02/07/2016  . 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 Fissure in ano 11/11/2014  . Hemorrhoids 11/11/2014  . Constipation 11/19/2013  . GERD (gastroesophageal reflux disease) 03/24/2013  . Irritable bowel syndrome with diarrhea 03/24/2013  . Herpes zoster 05/14/2005  . Abnormal Pap smear of cervix 05/15/1983  . History of cervical dysplasia 05/15/1983   Karl Luke PT, DPT Netta Corrigan 05/17/2020, 12:51 PM  [place] MAIN [facility] SERVICES [address] [place], [place], [address] Phone: [phone]   Fax:  [phone]  Name: DYLANNE HUSA MRN: [id number] Date of Birth: 23-Jan-1956

## 2020-05-17 NOTE — Therapy (Signed)
[place] MAIN [facility] SERVICES [address] [place], [place], [address] Phone: [phone]   Fax:  [phone]  Occupational Therapy Treatment  Patient Details  Name: Erika Stanton MRN: [id number] Date of Birth: [birth year] Referring Provider (OT): Dr. Naaman Plummer   Encounter Date: 05/17/2020   OT End of Session - 05/17/20 1120    Visit Number 23    Number of Visits 26    Date for OT Re-Evaluation 06/08/20    Authorization Time [place]    OT Start Time 1015    OT Stop Time 1100    OT Time Calculation (min) 45 min    Activity Tolerance Patient tolerated treatment well    Behavior During Therapy [facility] for tasks assessed/performed           Past Medical History:  Diagnosis Date  . Aneurysm ([place])   . Melena     Past Surgical History:  Procedure Laterality Date  . CHOLECYSTECTOMY    . COLONOSCOPY  11/2017   at [facility]. no recurrent polyps.  suggest repeat surveillance study 11/2022.    Marland Kitchen COLONOSCOPY W/ POLYPECTOMY  06/2014   Dr Roxy Manns at [facility].  3 adenomatoous polyps, anal fissure.    . ESOPHAGOGASTRODUODENOSCOPY (EGD) WITH PROPOFOL N/A 01/27/2020   Procedure: ESOPHAGOGASTRODUODENOSCOPY (EGD) WITH PROPOFOL;  Surgeon: Mauri Pole, MD;  Location: [place] [facility];  Service: Endoscopy;  Laterality: N/A;  . IR 3D INDEPENDENT WKST  01/06/2020  . IR ANGIO INTRA EXTRACRAN SEL INTERNAL CAROTID BILAT MOD SED  01/06/2020  . IR ANGIO VERTEBRAL SEL VERTEBRAL UNI L MOD SED  01/06/2020  . IR ANGIOGRAM FOLLOW UP STUDY  01/06/2020  . IR ANGIOGRAM FOLLOW UP STUDY  01/06/2020  . IR ANGIOGRAM FOLLOW UP STUDY  01/06/2020  . IR ANGIOGRAM FOLLOW UP STUDY  01/06/2020  . IR ANGIOGRAM FOLLOW UP STUDY  01/06/2020  . IR ANGIOGRAM FOLLOW UP STUDY  01/06/2020  . IR ANGIOGRAM FOLLOW UP STUDY  01/06/2020  . IR ANGIOGRAM FOLLOW UP STUDY  01/06/2020  . IR ANGIOGRAM FOLLOW UP STUDY  01/06/2020  . IR ANGIOGRAM FOLLOW UP STUDY  01/06/2020  . IR NEURO EACH ADD'L AFTER BASIC  UNI RIGHT (MS)  01/06/2020  . IR TRANSCATH/EMBOLIZ  01/06/2020  . RADIOLOGY WITH ANESTHESIA N/A 01/06/2020   Procedure: IR WITH ANESTHESIA FOR ANEURYSM;  Surgeon: Consuella Lose, MD;  Location: [place];  Service: Radiology;  Laterality: N/A;  . TUBAL LIGATION      There were no vitals filed for this visit.   Subjective Assessment - 05/17/20 1117    Subjective  Pt reports that the splint has become easier to donn, and that she is able to wear it throughout the night.    Pertinent History ICH 01/06/20, Aneurysm. PMH: OA bilateral knees and hands    Patient Stated Goals Pt would like to be as independent as possible.    Currently in Pain? Yes    Pain Score 2     Pain Location Arm    Pain Orientation Right    Pain Descriptors / Indicators Aching          OT TREATMENT   Therapeutic Exercise:  Pt. performed tolerated PROM for 74 degrees of shoulder flexion, and 67 degrees of shoulder abduction in supine. Pt. was able to tolerate AAROM for elbow flexion, elbow extension with resistance, AAROM supination with PROM to the end range. PROM for right wrist, digit MP, PIP, and DIP flexion, and extension, AROM digit  MP, PIP, and DIP flexion, and extension. Education was provided about opportunities to perform hand to face patterns with the right UE to begin engaging it during self-feeding. Pt. was able to tolerate positioning her right forearm in pronation with her palm placed down on a pillow with her digits in extension, and thumb in abduction.  Pt. was able to tolerate more ROM when her attention is diverted with conversation. Pt. Tolerated moist heat modality to the right shoulder, and right hand prior to ROM.  Manual Therapy:  Pt. tolerated right scapular mobes in sitting for elevation, depression, abduction/rotation in sidelying. Mobilizations were performed prior to, independent of, and in preparation for ROM.   Pt. was able to tolerate increased shoulder PROM from a supine  position.Pt. was able to achieve increased extension with PROM, and self-ROM in the right digit MP, PIP, and DIPs, and thumb palmar abduction, as well as radial abductionwith decreased pain following slow gentle ROM. Pt. was able to tolerate positioning her forearm in pronation on a pillow with her digits extended, and thumb abducted. Pt. Continues to present with pain initially, howeverimproves with ROM. Pt. Continues to be able to initiate active right thumb, and 2nd digit movement to initiate formulating a lateral grasp pattern.                           OT Education - 05/17/20 1119    Education Details ROM, pain control, strength    Person(s) Educated Patient    Methods Explanation;Demonstration;Verbal cues;Handout    Comprehension Verbalized understanding;Returned demonstration;Verbal cues required            OT Short Term Goals - 05/09/20 1031      OT SHORT TERM GOAL #3   Title Pt will donn shirt and pants with min A    Baseline Pt. is limited    Time 4    Period Weeks    Status On-going    Target Date 06/08/20      OT SHORT TERM GOAL #4   Title Pt will demonstrate 30 A/ROM shoulder flexion for RUE with pain less than or equal to 4/10.    Baseline Pt. presents with limited right shoulder flexion with pain between 2-4    Time 4    Period Weeks    Status On-going    Target Date 06/08/20      OT SHORT TERM GOAL #5   Title Pt will  demonstrate improved LUE fine motor coordination for ADLs as evidenced by decreasing 9 hole peg test score to 45 secs or less.    Baseline Left [place] 41 sec visa the 9 hole peg test    Time 6    Period Weeks      OT SHORT TERM GOAL #6   Title Pt/ caregiver will verbalize understanding of RUE positioning and edema control techniques to minimize pain and risk for injury    Baseline Pt. uses a pillow, and has a resting hand slpint,    Time 4    Period Weeks    Status On-going    Target Date 06/08/20      OT SHORT TERM  GOAL #7   Title Pt will demonstrate at least 25% finger flexion/ extension in RUE in prep for functional use.    Baseline Limited PROM, and active digit flexion, and extension    Time 4    Period Weeks    Status On-going    Target Date  06/08/20             OT Long Term Goals - 05/09/20 1039      OT LONG TERM GOAL #1   Title Pt will perform  all basic ADLs with supervision    Baseline Pt. continues to be limited    Time 8    Period Weeks    Status On-going    Target Date 06/08/20      OT LONG TERM GOAL #2   Title Pt will demonstrate 45* RUE shoulder flexion in prep for functional reach with pain less than or equal to 3/10.    Baseline Pt. presents with limited right shoulder flexion with pain 2-4.    Time 8    Period Weeks    Status On-going    Target Date 06/08/20      OT LONG TERM GOAL #3   Title Pt will use RUE as a stabilizer/ gross A at least 30 % of the time for ADLs/ IADLs.    Baseline Pt. is unalbe to use her right hand as agross assist secondary to limited ROM, and pain.    Time 12    Period Weeks    Status On-going    Target Date 06/08/20      OT LONG TERM GOAL #4   Title Pt will increase LUE grip strength to 25 lbs or greater for increased ease with ADLs.    Baseline Pt. presents with decreased left grip stregth    Time 8    Period Weeks    Status On-going    Target Date 06/08/20      OT LONG TERM GOAL #5   Title Pt will perform simple home management/ snack prep with min A.    Baseline Pt. has difficulty    Time 8    Period Weeks    Status On-going    Target Date 06/08/20      OT LONG TERM GOAL #6   Title Pt will demonstrate ability to grasp/ release a cup 2/3 trials with RUE.    Baseline Pt. is unable to grasp/release a cup.    Time 8    Period Weeks    Status On-going    Target Date 06/08/20      OT LONG TERM GOAL #7   Title Pt will safely navigate a busy environment and locate items with supervision and  90% or greater accuracy without  bumping into items.    Baseline TBA    Time 8    Period Weeks    Status On-going    Target Date 06/08/20      OT LONG TERM GOAL #8   Title I with updated HEP.    Baseline Pt. requires assist, and cuing for HEP.    Time 8    Period Weeks    Status On-going    Target Date 06/08/20                 Plan - 05/17/20 1120    Clinical Impression Statement Pt. was able to tolerate increased shoulder PROM from a supine position.Pt. was able to achieve increased extension with PROM, and self-ROM in the right digit MP, PIP, and DIPs, and thumb palmar abduction, as well as radial abductionwith decreased pain following slow gentle ROM. Pt. was able to tolerate positioning her forearm in pronation on a pillow with her digits extended, and thumb abducted. Pt. Continues to present with pain initially, howeverimproves with ROM. Pt. Continues to  be able to initiate active right thumb, and 2nd digit movement to initiate formulating a lateral grasp pattern.   OT Occupational Profile and History Detailed Assessment- Review of Records and additional review of physical, cognitive, psychosocial history related to current functional performance    Occupational performance deficits (Please refer to evaluation for details): ADL's;IADL's;Rest and Sleep;Work;Play;Leisure    Body Structure / Function / Physical Skills ADL;Balance;Coordination;Decreased knowledge of precautions;Decreased knowledge of use of DME;Dexterity;Edema;Mobility;Tone;Strength;IADL;Sensation;GMC;Gait;ROM;FMC;Flexibility;Pain;Vision;UE functional use;Endurance    Cognitive Skills Attention;Perception;Problem Solve;Safety Awareness;Thought    Rehab Potential Good    Clinical Decision Making Several treatment options, min-mod task modification necessary    Comorbidities Affecting Occupational Performance: None    Modification or Assistance to Complete Evaluation  Min-Moderate modification of tasks or assist with assess necessary to complete  eval    OT Frequency 3x / week    OT Duration 8 weeks    OT Treatment/Interventions Self-care/ADL training;Ultrasound;Energy conservation;Visual/perceptual remediation/compensation;Patient/family education;DME and/or AE instruction;Aquatic Therapy;Paraffin;Gait Training;Passive range of motion;Balance training;Fluidtherapy;Electrical Stimulation;Functional Mobility Training;Splinting;Moist Heat;Therapeutic exercise;Manual Therapy;Cognitive remediation/compensation;Manual lymph drainage;Neuromuscular education;Coping strategies training    Consulted and Agree with Plan of Care Patient;Family member/caregiver           Patient will benefit from skilled therapeutic intervention in order to improve the following deficits and impairments:   Body Structure / Function / Physical Skills: ADL,Balance,Coordination,Decreased knowledge of precautions,Decreased knowledge of use of DME,Dexterity,Edema,Mobility,Tone,Strength,IADL,Sensation,GMC,Gait,ROM,FMC,Flexibility,Pain,Vision,UE functional use,Endurance Cognitive Skills: Attention,Perception,Problem Solve,Safety Awareness,Thought     Visit Diagnosis: Muscle weakness (generalized)    Problem List Patient Active Problem List   Diagnosis Date Noted  . Dyspareunia in female 04/05/2020  . History of abnormal cervical Pap smear 03/10/2020  . GIB (gastrointestinal bleeding) 02/28/2020  . Elevated BUN   . Prediabetes   . 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 Cerebral vasospasm   . Sinus tachycardia   . Dysphagia, post-stroke   . Thrombocytopenia ([place])   . Acute blood loss anemia   . Brain aneurysm   . ICH (intracerebral hemorrhage) ([place]) 01/05/2020  . History of adenomatous polyp of colon 02/07/2016  . 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 Fissure in ano 11/11/2014  . Hemorrhoids 11/11/2014  . Constipation 11/19/2013  . GERD (gastroesophageal reflux disease) 03/24/2013  . Irritable bowel syndrome with diarrhea 03/24/2013   . Herpes zoster 05/14/2005  . Abnormal Pap smear of cervix 05/15/1983  . History of cervical dysplasia 05/15/1983    Harrel Carina, MS, OTR/L 05/17/2020, 11:27 AM  [place] MAIN [facility] SERVICES [address] [place], [place], [address] Phone: [phone]   Fax:  [phone]  Name: Erika Stanton MRN: [id number] Date of Birth: Dec 28, 1955

## 2020-05-18 ENCOUNTER — Other Ambulatory Visit: Payer: Self-pay

## 2020-05-19 ENCOUNTER — Ambulatory Visit: Payer: BC Managed Care – PPO | Admitting: Speech Pathology

## 2020-05-19 ENCOUNTER — Ambulatory Visit: Payer: BC Managed Care – PPO | Admitting: Occupational Therapy

## 2020-05-19 ENCOUNTER — Ambulatory Visit: Payer: BC Managed Care – PPO | Admitting: Physical Therapy

## 2020-05-20 ENCOUNTER — Other Ambulatory Visit: Payer: Self-pay | Admitting: *Deleted

## 2020-05-20 MED ORDER — GABAPENTIN 300 MG PO CAPS
300.0000 mg | ORAL_CAPSULE | Freq: Three times a day (TID) | ORAL | 1 refills | Status: DC
Start: 2020-05-20 — End: 2020-07-06

## 2020-05-20 MED ORDER — GABAPENTIN 300 MG PO CAPS
300.0000 mg | ORAL_CAPSULE | Freq: Three times a day (TID) | ORAL | 1 refills | Status: DC
Start: 2020-05-20 — End: 2020-05-20

## 2020-05-24 ENCOUNTER — Ambulatory Visit: Payer: BC Managed Care – PPO | Admitting: Occupational Therapy

## 2020-05-24 ENCOUNTER — Encounter: Payer: Self-pay | Admitting: Physical Therapy

## 2020-05-24 ENCOUNTER — Other Ambulatory Visit: Payer: Self-pay

## 2020-05-24 ENCOUNTER — Ambulatory Visit: Payer: BC Managed Care – PPO | Admitting: Speech Pathology

## 2020-05-24 ENCOUNTER — Ambulatory Visit: Payer: BC Managed Care – PPO | Admitting: Physical Therapy

## 2020-05-24 DIAGNOSIS — M6281 Muscle weakness (generalized): Secondary | ICD-10-CM

## 2020-05-24 DIAGNOSIS — R2681 Unsteadiness on feet: Secondary | ICD-10-CM

## 2020-05-24 DIAGNOSIS — R278 Other lack of coordination: Secondary | ICD-10-CM

## 2020-05-24 DIAGNOSIS — R2689 Other abnormalities of gait and mobility: Secondary | ICD-10-CM

## 2020-05-24 DIAGNOSIS — I69159 Hemiplegia and hemiparesis following nontraumatic intracerebral hemorrhage affecting unspecified side: Secondary | ICD-10-CM

## 2020-05-24 DIAGNOSIS — R41841 Cognitive communication deficit: Secondary | ICD-10-CM

## 2020-05-24 NOTE — Patient Instructions (Signed)
Local Driver Evaluation Programs: ° °Comprehensive Evaluation: includes clinical and in vehicle behind the wheel testing by OCCUPATIONAL THERAPIST. Programs have varying levels of adaptive controls available for trial.  ° °[facility] °[address] °[place], [place]  [address] °[phone] or [phone] °http://www.driver-rehab.com °Evaluator:  Cyndee Crompton, OT/CDRS/CDI/SCDCM/Low Vision Certification ° °[facility] °[address] °[place], [place] [address] °[phone] °https://www.[facility].org/home/services/rehabilitation.aspx °Evaluators:  Shannon Sheek, OT and Jill Tucker, OT ° °[facility] [place] [facility] (ONLY SERVES VETERANS!!) °[facility] °[address] °[place], [place]  [address] °[phone] °http://www.[place].va.gov/services/Physical_Medicine_Rehabilitation_Services.asp °Evaluators:  Eric Andrews, KT; Heidi Harris, KT;  Gary Whitaker, KT (KT=kiniesotherapist) ° ° °Clinical evaluations only:  Includes clinical testing, refers to other programs or local certified driving instructor for behind the wheel testing. ° °[facility] (outpatient Rehab) °[facility] °[address] °[place], [place] [address] °[phone] for scheduling °http://[email or web address]/Outpatient-Rehabilitation/Neurorehabilitation-Therapy.htm °Evaluators:  Kelly Lambeth, OT; Kate Phillips, OT ° °Other area clinical evaluators available upon request including [facility], [facility] and [facility]. ° ° °    Resource List °What is a Driver Evaluation: °Your Road Ahead - A Guide to Comprehensive Driving Evaluations °http://www.thehartford.com/resources/mature-market-excellence/publications-on-aging ° °[employer] - Disability and Driving Fact Sheets °http://www.aded.net/?page=510 ° °Driving after a Brain  Injury: °[employer] °http://www.biausa.org/tbims-abstracts/if-there-is-an-effective-way-to-determine-if-someone-is-ready-to-drive-after-tbi?A=SearchResult&SearchID=9495675&ObjectID=2758842&ObjectType=35 ° °Driving with Adaptive Equipment: °[facility] °http://www.driver-rehab.com/adaptive-equipment ° °[employer] °http://www.nmeda.com/ ° ° ° ° ° ° °  °

## 2020-05-24 NOTE — Therapy (Signed)
[place] MAIN [facility] SERVICES [address] [place], [place], [address] Phone: [phone]   Fax:  [phone]  Physical Therapy Treatment  Patient Details  Name: Erika Stanton MRN: [id number] Date of Birth: 1956-02-17 Referring Provider (PT): Alger Simons, MD   Encounter Date: 05/24/2020   PT End of Session - 05/24/20 1009    Visit Number 25    Number of Visits 29    Date for PT Re-Evaluation 75/17/00   60 day cert, 30 day poc   Authorization Type [employer] State    PT Start Time [phone]    PT Stop Time 1015    PT Time Calculation (min) 40 min    Equipment Utilized During Treatment Gait belt    Activity Tolerance Patient tolerated treatment well    Behavior During Therapy [facility] for tasks assessed/performed           Past Medical History:  Diagnosis Date  . Aneurysm ([place])   . Melena     Past Surgical History:  Procedure Laterality Date  . CHOLECYSTECTOMY    . COLONOSCOPY  11/2017   at [facility]. no recurrent polyps.  suggest repeat surveillance study 11/2022.    Marland Kitchen COLONOSCOPY W/ POLYPECTOMY  06/2014   Dr Roxy Manns at [facility].  3 adenomatoous polyps, anal fissure.    . ESOPHAGOGASTRODUODENOSCOPY (EGD) WITH PROPOFOL N/A 01/27/2020   Procedure: ESOPHAGOGASTRODUODENOSCOPY (EGD) WITH PROPOFOL;  Surgeon: Mauri Pole, MD;  Location: [facility];  Service: Endoscopy;  Laterality: N/A;  . IR 3D INDEPENDENT WKST  01/06/2020  . IR ANGIO INTRA EXTRACRAN SEL INTERNAL CAROTID BILAT MOD SED  01/06/2020  . IR ANGIO VERTEBRAL SEL VERTEBRAL UNI L MOD SED  01/06/2020  . IR ANGIOGRAM FOLLOW UP STUDY  01/06/2020  . IR ANGIOGRAM FOLLOW UP STUDY  01/06/2020  . IR ANGIOGRAM FOLLOW UP STUDY  01/06/2020  . IR ANGIOGRAM FOLLOW UP STUDY  01/06/2020  . IR ANGIOGRAM FOLLOW UP STUDY  01/06/2020  . IR ANGIOGRAM FOLLOW UP STUDY  01/06/2020  . IR ANGIOGRAM FOLLOW UP STUDY  01/06/2020  . IR ANGIOGRAM FOLLOW UP STUDY  01/06/2020  . IR ANGIOGRAM FOLLOW UP STUDY  01/06/2020  . IR  ANGIOGRAM FOLLOW UP STUDY  01/06/2020  . IR NEURO EACH ADD'L AFTER BASIC UNI RIGHT (MS)  01/06/2020  . IR TRANSCATH/EMBOLIZ  01/06/2020  . RADIOLOGY WITH ANESTHESIA N/A 01/06/2020   Procedure: IR WITH ANESTHESIA FOR ANEURYSM;  Surgeon: Consuella Lose, MD;  Location: [place];  Service: Radiology;  Laterality: N/A;  . TUBAL LIGATION      There were no vitals filed for this visit.   Subjective Assessment - 05/24/20 0937    Subjective Patient reports doing well; She reports her balance is doing a lot better. Denies any new falls; She reports feeling secure for the most part with ADLs.    Patient is accompained by: Family member    Limitations Walking;Standing;House hold activities    How long can you walk comfortably? Did a little walking yesterday up/down the hallway    Patient Stated Goals Use my right hand like I used to.    Currently in Pain? Yes    Pain Score 4     Pain Location Knee    Pain Orientation Right    Pain Descriptors / Indicators Aching;Sore    Pain Type Acute pain    Pain Onset 1 to 4 weeks ago    Pain Frequency Intermittent    Aggravating Factors  worse  with cold weather    Pain Relieving Factors rest/heat    Effect of Pain on Daily Activities decreased activity tolerance;    Multiple Pain Sites Yes    Pain Score 4    Pain Location Hand    Pain Orientation Right    Pain Descriptors / Indicators Aching    Pain Type Chronic pain    Pain Onset More than a month ago    Pain Frequency Constant    Aggravating Factors  moving it    Pain Relieving Factors rest    Effect of Pain on Daily Activities decreased ADL ability;              [facility] PT Assessment - 05/24/20 0001      6 minute walk test results    Aerobic Endurance Distance Walked 1200    Endurance additional comments without AD, good balance and motor control, good reciprocal gait pattern, no imbalance noted      Standardized Balance Assessment   Standardized Balance Assessment Five Times Sit to Stand     Five times sit to stand comments  17.85 with arms across chest from regular height chair (22 inch height); more impaired than last reassessment on 12/10 which was 13.45 sec from mat table (unsure of height)      Functional Gait  Assessment   Gait Level Surface Walks 20 ft in less than 5.5 sec, no assistive devices, good speed, no evidence for imbalance, normal gait pattern, deviates no more than 6 in outside of the 12 in walkway width.    Change in Gait Speed Able to smoothly change walking speed without loss of balance or gait deviation. Deviate no more than 6 in outside of the 12 in walkway width.    Gait with Horizontal Head Turns Performs head turns smoothly with no change in gait. Deviates no more than 6 in outside 12 in walkway width    Gait with Vertical Head Turns Performs head turns with no change in gait. Deviates no more than 6 in outside 12 in walkway width.    Gait and Pivot Turn Pivot turns safely within 3 sec and stops quickly with no loss of balance.    Step Over Obstacle Is able to step over 2 stacked shoe boxes taped together (9 in total height) without changing gait speed. No evidence of imbalance.    Gait with Narrow Base of Support Is able to ambulate for 10 steps heel to toe with no staggering.    Gait with Eyes Closed Walks 20 ft, uses assistive device, slower speed, mild gait deviations, deviates 6-10 in outside 12 in walkway width. Ambulates 20 ft in less than 9 sec but greater than 7 sec.    Ambulating Backwards Walks 20 ft, uses assistive device, slower speed, mild gait deviations, deviates 6-10 in outside 12 in walkway width.    Steps Alternating feet, must use rail.    Total Score 27           TREATMENT: Warm up on Nustep BLE only level 2 x5 min (Unbilled):    Instructed patient in functional gait assessment, 6 min walk and 5 times sit<>stand to address goals; see above;  Patient is able to ambulate on level surfaces independently with good gait speed. She does  have difficulty with higher level balance tasks such as backwards walking or walking with eyes closed. Patient is scoring as a low fall risk. She reports feeling comfortable walking on unlevel surfaces if she has  to but admits when her husband is near she will hold onto his arm as needed;  She would benefit from additional skilled PT intervention to improve dynamic balance and dual tasks as able. Patient continues to have limited UE movement due to hemiplegia which limits ADL ability and does affect her high level balance;                        PT Education - 05/24/20 1130    Education Details progress towards goals/recommendations, HEP    Person(s) Educated Patient    Methods Explanation;Verbal cues    Comprehension Verbalized understanding;Returned demonstration;Verbal cues required;Need further instruction            PT Short Term Goals - 04/22/20 1957      PT SHORT TERM GOAL #1   Title STGs=LTGs             PT Long Term Goals - 05/24/20 0940      PT LONG TERM GOAL #1   Title Pt will be independent with progressive HEP for balance and strengthening to continue gains on own.    Baseline Pt reports that her PT exercises are going well and doing as instructed. Will continue to progress adding more core stabilization. 1/11: able to do 2-3 times a week;    Time 4    Period Weeks    Status Achieved    Target Date 06/21/20      PT LONG TERM GOAL #2   Title Pt will increase FGA from 26/30 to 28/30 for improved balance and gait to decrease fall risk.    Baseline 03/23/20 19/30, 04/22/20 26/30, 1/11:    Time 4    Period Weeks    Status Not Met    Target Date 06/21/20      PT LONG TERM GOAL #3   Title Pt will perform 5TSS in </=11 secs without UE support in order to indicate improved balance and functional strength    Baseline 16 seconds with L UE support 03/21/20, 04/22/20 13.54 sec without hands from mat, 1/11:17.85 sec with arms across chest from regular  height chair;    Time 4    Period Weeks    Status Not Met    Target Date 06/21/20      PT LONG TERM GOAL #4   Title Pt will ambulate >1000' on varied surfaces independently with no LOB for improved community ambulation without AD.    Baseline 1050' including >300 feet of grass surface with SBA 03/21/20; we worked on ramp and curb also. 04/22/20 1150' mod I/supervision on varied surfaces inside due to cold., 1/11: independent;    Time 4    Period Weeks    Status Achieved    Target Date 06/21/20                 Plan - 05/24/20 1131    Clinical Impression Statement Patient motivated and participated well within session. She was instructed in outcome measures to address progress towards goals. Patient does report increased knee discomfort today likely from cold weather as a result of OA. Patient does exhibit slower 5 times sit<>stand this session. Unsure if this is related to knee discomfort or as a result of different chair height or both. Patient is able to ambulate on level surfaces independently and reports feeling mostly comfortable on unlevel surfaces. Although she does admit that when her husband is around sometimes she will hold onto his arm when  negotiating curbs/ramps outside. Patient exhibits mild improvement in functional gait assessment, although is scoring as a low fall risk. She would benefit from a few additional skilled PT Intervention to address higher level balance such as backwards walking/multi-directional stepping or negotiating various obstacles for reduced fall risk. Patient agreeable.    Personal Factors and Comorbidities Age;Comorbidity 1;Profession    Comorbidities B ICHs    Examination-Activity Limitations Bend;Carry;Lift;Locomotion Level;Reach Overhead;Squat;Stairs;Stand;Transfers    Examination-Participation Restrictions Cleaning;Community Activity;Driving;Occupation;Yard Work;Laundry;Meal Prep    Stability/Clinical Decision Making Evolving/Moderate complexity     Rehab Potential Good    PT Frequency 1x / week   then 1x/week for 2 weeks   PT Duration 4 weeks    PT Treatment/Interventions ADLs/Self Care Home Management;Aquatic Therapy;Electrical Stimulation;DME Instruction;Gait training;Stair training;Functional mobility training;Therapeutic activities;Therapeutic exercise;Balance training;Neuromuscular re-education;Cognitive remediation;Patient/family education;Orthotic Fit/Training;Manual techniques;Passive range of motion;Dry needling;Energy conservation;Vestibular;Visual/perceptual remediation/compensation    PT Next Visit Plan work on dynamic balance and dual tasks when appropriate as well as LLE stabilization;    Consulted and Agree with Plan of Care Patient;Family member/caregiver           Patient will benefit from skilled therapeutic intervention in order to improve the following deficits and impairments:  Abnormal gait,Decreased activity tolerance,Decreased balance,Decreased cognition,Decreased endurance,Decreased knowledge of use of DME,Decreased mobility,Decreased strength,Impaired perceived functional ability,Impaired flexibility,Impaired sensation,Postural dysfunction,Improper body mechanics,Impaired vision/preception,Impaired UE functional use,Impaired tone  Visit Diagnosis: Muscle weakness (generalized)  Other lack of coordination  Unsteadiness on feet  Other abnormalities of gait and mobility     Problem List Patient Active Problem List   Diagnosis Date Noted  . Dyspareunia in female 04/05/2020  . History of abnormal cervical Pap smear 03/10/2020  . GIB (gastrointestinal bleeding) 02/28/2020  . Elevated BUN   . Prediabetes   . 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 Cerebral vasospasm   . Sinus tachycardia   . Dysphagia, post-stroke   . Thrombocytopenia ([place])   . Acute blood loss anemia   . Brain aneurysm   . ICH (intracerebral hemorrhage) ([place]) 01/05/2020  . History of adenomatous polyp of colon 02/07/2016  .  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 Fissure in ano 11/11/2014  . Hemorrhoids 11/11/2014  . Constipation 11/19/2013  . GERD (gastroesophageal reflux disease) 03/24/2013  . Irritable bowel syndrome with diarrhea 03/24/2013  . Herpes zoster 05/14/2005  . Abnormal Pap smear of cervix 05/15/1983  . History of cervical dysplasia 05/15/1983    Ariel Dimitri PT, DPT 05/24/2020, 11:38 AM  [place] MAIN [facility] SERVICES [address] [place], [place], [address] Phone: [phone]   Fax:  [phone]  Name: Erika Stanton MRN: [id number] Date of Birth: 1956/02/09

## 2020-05-24 NOTE — Therapy (Signed)
[place] MAIN [facility] SERVICES [address] [place], [place], [address] Phone: [phone]   Fax:  [phone]  Speech Language Pathology Treatment and Progress Note  Patient Details  Name: Erika Stanton MRN: [id number] Date of Birth: August 18, 1955 Referring Provider (SLP): Alger Simons, MD  Speech Therapy Progress Note  Dates of Reporting Period: 03/30/20 to 05/24/20  Patient has been seen for 10 speech therapy sessions this reporting period targeting cognitive communication deficits. She is making slow but steady progress toward 4/4 active long-term goals.    Encounter Date: 05/24/2020   End of Session - 05/24/20 1626    Visit Number 20    Number of Visits 33    Date for SLP Re-Evaluation 07/09/20    Authorization - Visit Number 10    Progress Note Due on Visit 10    SLP Start Time 1100    SLP Stop Time  1150    SLP Time Calculation (min) 50 min    Activity Tolerance Patient tolerated treatment well           Past Medical History:  Diagnosis Date  . Aneurysm ([place])   . Melena     Past Surgical History:  Procedure Laterality Date  . CHOLECYSTECTOMY    . COLONOSCOPY  11/2017   at [facility]. no recurrent polyps.  suggest repeat surveillance study 11/2022.    Marland Kitchen COLONOSCOPY W/ POLYPECTOMY  06/2014   Dr Roxy Manns at [facility].  3 adenomatoous polyps, anal fissure.    . ESOPHAGOGASTRODUODENOSCOPY (EGD) WITH PROPOFOL N/A 01/27/2020   Procedure: ESOPHAGOGASTRODUODENOSCOPY (EGD) WITH PROPOFOL;  Surgeon: Mauri Pole, MD;  Location: [place] [facility];  Service: Endoscopy;  Laterality: N/A;  . IR 3D INDEPENDENT WKST  01/06/2020  . IR ANGIO INTRA EXTRACRAN SEL INTERNAL CAROTID BILAT MOD SED  01/06/2020  . IR ANGIO VERTEBRAL SEL VERTEBRAL UNI L MOD SED  01/06/2020  . IR ANGIOGRAM FOLLOW UP STUDY  01/06/2020  . IR ANGIOGRAM FOLLOW UP STUDY  01/06/2020  . IR ANGIOGRAM FOLLOW UP STUDY  01/06/2020  . IR ANGIOGRAM FOLLOW UP STUDY  01/06/2020  . IR ANGIOGRAM  FOLLOW UP STUDY  01/06/2020  . IR ANGIOGRAM FOLLOW UP STUDY  01/06/2020  . IR ANGIOGRAM FOLLOW UP STUDY  01/06/2020  . IR ANGIOGRAM FOLLOW UP STUDY  01/06/2020  . IR ANGIOGRAM FOLLOW UP STUDY  01/06/2020  . IR ANGIOGRAM FOLLOW UP STUDY  01/06/2020  . IR NEURO EACH ADD'L AFTER BASIC UNI RIGHT (MS)  01/06/2020  . IR TRANSCATH/EMBOLIZ  01/06/2020  . RADIOLOGY WITH ANESTHESIA N/A 01/06/2020   Procedure: IR WITH ANESTHESIA FOR ANEURYSM;  Surgeon: Consuella Lose, MD;  Location: [place];  Service: Radiology;  Laterality: N/A;  . TUBAL LIGATION      There were no vitals filed for this visit.   Subjective Assessment - 05/24/20 1454    Subjective "I texted the grocery list and the menu."    Currently in Pain? Yes    Pain Score 4     Pain Location Knee                 ADULT SLP TREATMENT - 05/24/20 1455      General Information   Behavior/Cognition Alert;Cooperative    HPI Pt presented with headache and lt hemiplegia on 01-05-20. 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 Stent and embolization of rt ICA on 01-06-20. Pt rec'd ST on CIR for cognition and swallowing and was d/c'd on dys III/thin with supervision level cues for swallow precautions. [name] was d/c'd 02-23-20.      Treatment Provided   Treatment provided Cognitive-Linquistic      Cognitive-Linquistic Treatment   Treatment focused on Cognition;Patient/family/caregiver education    Skilled Treatment [name] continued with making the grocery list and menu for the week; she used the notes app on her phone and texted these to her husband. She also listened to some podcasts and discussed them with Ronalee Belts. She reports not all were of interest to her, and it was more difficult to pay attention to those. In session today, patient listened to TED talk for 10 minutes re: memory  changes. Afterwards engaged in summary appropriately, and was able to recall specific details when questioned. Patient mentioned possibility of return to driving; wants to practice in parking lot with her husband. Encouraged her to discuss this with MD. Educated re: OT driving evaluation information, to provide handout next session. Educated that attention/cognitive deficits may impact safety. Targeted alternating attention with visuospatial task; pt accuracy was 93% (2 overlooked symbols).      Assessment / Recommendations / Plan   Plan Continue with current plan of care      Progression Toward Goals   Progression toward goals Progressing toward goals   slow but steady progress           SLP Education - 05/24/20 1625    Education Details OT driving evaluation information    Person(s) Educated Patient    Methods Explanation    Comprehension Verbalized understanding;Need further instruction            SLP Short Term Goals - 05/24/20 1629      SLP SHORT TERM GOAL #1   Title pt to complete cognitive linguistic testing    Period --   or 4 total sessions   Status Achieved      SLP SHORT TERM GOAL #2   Title pt will demo emergent awareness 100% with min complex therapy tasks x3 visits    Baseline 03-30-20;    Status Partially Met      SLP SHORT TERM GOAL #3   Title pt will demo appriopriate aspiration precuations with suggested POs x3 sessions    Status Deferred      SLP SHORT TERM GOAL #4   Title pt will solve household probelms using functional solutions x3 sessions    Baseline 03-30-20; 04-11-20    Status Achieved      SLP SHORT TERM GOAL #5   Title pt will demo selective attention (internal distraction) to a simple functional task for 10 minutes with rare min A back to task in 3 sessions    Baseline 03-28-20    Period Weeks    Status Achieved      SLP SHORT TERM GOAL #6   Title Patient will preplan daily schedule 5/7 days.    Time 4    Period Weeks    Status New             SLP Long Term Goals - 05/24/20 1630      SLP LONG TERM GOAL #1   Title pt will demo anticipatory awareness by double checking all work with a non-verbal cue x3 sessions    Time 8    Period Weeks   or 17 total sessions, for all LTGs  Status Partially Met      SLP LONG TERM GOAL #2   Title pt will follow swallow precautions with POs with modified independence x3 sessions    Status Deferred      SLP LONG TERM GOAL #3   Title pt will demo [facility] skills in solving min-mod complex problems in WNL amount of time with modified independence (double checking answers , etc)    Time 8    Period Weeks    Status Partially Met      SLP LONG TERM GOAL #4   Title pt will demo simple alternating attention in functional cognitive linguistic tasks in 3 sessions    Time 8    Period Weeks    Status Partially Met      SLP LONG TERM GOAL #5   Title Patient will use schedule to complete 3 pre-planned tasks with min cues from spouse.    Time 8    Period Weeks    Status Partially Met            Plan - 05/24/20 1626    Clinical Impression Statement Keyah continues to make progress in keeping with a daily schedule and using aids and strategies to help her participate in planning meals and shopping trips. In session today she required cues for visual attention, error awareness. Impulsivity at times but was less impulsive in session today. Continue skilled ST to maximize independence, cognitive function, and participation in daily activities.    Speech Therapy Frequency 2x / week    Duration 8 weeks    Treatment/Interventions Aspiration precaution training;Pharyngeal strengthening exercises;Compensatory techniques;Diet toleration management by SLP;Trials of upgraded texture/liquids;Cueing hierarchy;Cognitive reorganization;Internal/external aids;Patient/family education;SLP instruction and feedback;Functional tasks    Potential to Achieve Goals Good    Consulted and Agree with Plan of Care  Patient;Family member/caregiver           Patient will benefit from skilled therapeutic intervention in order to improve the following deficits and impairments:   Cognitive communication deficit    Problem List Patient Active Problem List   Diagnosis Date Noted  . Dyspareunia in female 04/05/2020  . History of abnormal cervical Pap smear 03/10/2020  . GIB (gastrointestinal bleeding) 02/28/2020  . Elevated BUN   . Prediabetes   . 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 Cerebral vasospasm   . Sinus tachycardia   . Dysphagia, post-stroke   . Thrombocytopenia ([place])   . Acute blood loss anemia   . Brain aneurysm   . ICH (intracerebral hemorrhage) ([place]) 01/05/2020  . History of adenomatous polyp of colon 02/07/2016  . 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 Fissure in ano 11/11/2014  . Hemorrhoids 11/11/2014  . Constipation 11/19/2013  . GERD (gastroesophageal reflux disease) 03/24/2013  . Irritable bowel syndrome with diarrhea 03/24/2013  . Herpes zoster 05/14/2005  . Abnormal Pap smear of cervix 05/15/1983  . History of cervical dysplasia 05/15/1983   [name], [place], [place] 05/24/2020, 4:31 PM  [place] MAIN [facility] SERVICES [address] [place], [place], [address] Phone: [phone]   Fax:  [phone]   Name: Erika Stanton MRN: [id number] Date of Birth: 07/22/1955

## 2020-05-26 ENCOUNTER — Ambulatory Visit: Payer: BC Managed Care – PPO | Admitting: Physical Therapy

## 2020-05-26 ENCOUNTER — Other Ambulatory Visit: Payer: Self-pay

## 2020-05-26 ENCOUNTER — Ambulatory Visit: Payer: BC Managed Care – PPO | Admitting: Speech Pathology

## 2020-05-26 ENCOUNTER — Ambulatory Visit: Payer: BC Managed Care – PPO | Admitting: Occupational Therapy

## 2020-05-26 DIAGNOSIS — M6281 Muscle weakness (generalized): Secondary | ICD-10-CM

## 2020-05-26 DIAGNOSIS — I69159 Hemiplegia and hemiparesis following nontraumatic intracerebral hemorrhage affecting unspecified side: Secondary | ICD-10-CM

## 2020-05-26 DIAGNOSIS — R278 Other lack of coordination: Secondary | ICD-10-CM

## 2020-05-26 DIAGNOSIS — R41841 Cognitive communication deficit: Secondary | ICD-10-CM

## 2020-05-26 DIAGNOSIS — R2681 Unsteadiness on feet: Secondary | ICD-10-CM

## 2020-05-26 NOTE — Therapy (Signed)
[place] MAIN [facility] SERVICES [address] [place], [place], [address] Phone: [phone]   Fax:  [phone]  Speech Language Pathology Treatment  Patient Details  Name: Erika Stanton MRN: [id number] Date of Birth: Jul 29, 1955 Referring Provider (SLP): Alger Simons, MD   Encounter Date: 05/26/2020   End of Session - 05/26/20 1309    Visit Number 21    Number of Visits 33    Date for SLP Re-Evaluation 07/09/20    Authorization - Visit Number 1    Progress Note Due on Visit 10    SLP Start Time 1101    SLP Stop Time  1150    SLP Time Calculation (min) 49 min    Activity Tolerance Patient tolerated treatment well           Past Medical History:  Diagnosis Date  . Aneurysm ([place])   . Melena     Past Surgical History:  Procedure Laterality Date  . CHOLECYSTECTOMY    . COLONOSCOPY  11/2017   at [facility]. no recurrent polyps.  suggest repeat surveillance study 11/2022.    Marland Kitchen COLONOSCOPY W/ POLYPECTOMY  06/2014   Dr Roxy Manns at [facility].  3 adenomatoous polyps, anal fissure.    . ESOPHAGOGASTRODUODENOSCOPY (EGD) WITH PROPOFOL N/A 01/27/2020   Procedure: ESOPHAGOGASTRODUODENOSCOPY (EGD) WITH PROPOFOL;  Surgeon: Mauri Pole, MD;  Location: [facility];  Service: Endoscopy;  Laterality: N/A;  . IR 3D INDEPENDENT WKST  01/06/2020  . IR ANGIO INTRA EXTRACRAN SEL INTERNAL CAROTID BILAT MOD SED  01/06/2020  . IR ANGIO VERTEBRAL SEL VERTEBRAL UNI L MOD SED  01/06/2020  . IR ANGIOGRAM FOLLOW UP STUDY  01/06/2020  . IR ANGIOGRAM FOLLOW UP STUDY  01/06/2020  . IR ANGIOGRAM FOLLOW UP STUDY  01/06/2020  . IR ANGIOGRAM FOLLOW UP STUDY  01/06/2020  . IR ANGIOGRAM FOLLOW UP STUDY  01/06/2020  . IR ANGIOGRAM FOLLOW UP STUDY  01/06/2020  . IR ANGIOGRAM FOLLOW UP STUDY  01/06/2020  . IR ANGIOGRAM FOLLOW UP STUDY  01/06/2020  . IR ANGIOGRAM FOLLOW UP STUDY  01/06/2020  . IR ANGIOGRAM FOLLOW UP STUDY  01/06/2020  . IR NEURO EACH ADD'L AFTER BASIC UNI RIGHT (MS)   01/06/2020  . IR TRANSCATH/EMBOLIZ  01/06/2020  . RADIOLOGY WITH ANESTHESIA N/A 01/06/2020   Procedure: IR WITH ANESTHESIA FOR ANEURYSM;  Surgeon: Consuella Lose, MD;  Location: [facility];  Service: Radiology;  Laterality: N/A;  . TUBAL LIGATION      There were no vitals filed for this visit.   Subjective Assessment - 05/26/20 1305    Subjective "We have to put my cat down."    Currently in Pain? No/denies                 ADULT SLP TREATMENT - 05/26/20 1305      General Information   Behavior/Cognition Alert;Cooperative    HPI Pt presented with headache and lt hemiplegia on 01-05-20. 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 Stent and embolization of rt ICA on 01-06-20. Pt rec'd ST on CIR for cognition and swallowing and was d/c'd on dys III/thin with supervision level cues for swallow precautions. Erika Stanton was d/c'd 02-23-20.      Treatment Provided   Treatment provided Cognitive-Linquistic      Pain Assessment   Pain Assessment  No/denies pain      Cognitive-Linquistic Treatment   Treatment focused on Cognition;Patient/family/caregiver education    Skilled Treatment Jennife was tearful (appropriately) at the beginning of session about having to put her cat to sleep. Today we targeted awareness, attention to detail, recall, and impulsivity in structured therapy tasks. For facial recognition, pt accuracy was 88%, however she required moderate cues, fading to occasional min A, to use a strategy for recall vs "guessing". Similar cues necessary in pattern recognition task. Accuracy for 4 pattern sequence was 90%, but for 6 pattern sequence decreased to 55%. Executive function/attention to detail in map/navigation activity was 100% with extended time and moderate cues to use strategy, systematic approach.      Assessment /  Recommendations / Plan   Plan Continue with current plan of care      Progression Toward Goals   Progression toward goals Progressing toward goals            SLP Education - 05/26/20 1309    Education Details Try to slow down and use a strategy vs "guessing"    Person(s) Educated Patient    Methods Explanation    Comprehension Verbalized understanding;Verbal cues required;Need further instruction            SLP Short Term Goals - 05/24/20 1629      SLP SHORT TERM GOAL #1   Title pt to complete cognitive linguistic testing    Period --   or 4 total sessions   Status Achieved      SLP SHORT TERM GOAL #2   Title pt will demo emergent awareness 100% with min complex therapy tasks x3 visits    Baseline 03-30-20;    Status Partially Met      SLP SHORT TERM GOAL #3   Title pt will demo appriopriate aspiration precuations with suggested POs x3 sessions    Status Deferred      SLP SHORT TERM GOAL #4   Title pt will solve household probelms using functional solutions x3 sessions    Baseline 03-30-20; 04-11-20    Status Achieved      SLP SHORT TERM GOAL #5   Title pt will demo selective attention (internal distraction) to a simple functional task for 10 minutes with rare min A back to task in 3 sessions    Baseline 03-28-20    Period Weeks    Status Achieved      SLP SHORT TERM GOAL #6   Title Patient will preplan daily schedule 5/7 days.    Time 4    Period Weeks    Status New            SLP Long Term Goals - 05/24/20 1630      SLP LONG TERM GOAL #1   Title pt will demo anticipatory awareness by double checking all work with a non-verbal cue x3 sessions    Time 8    Period Weeks   or 17 total sessions, for all LTGs   Status Partially Met      SLP LONG TERM GOAL #2   Title pt will follow swallow precautions with POs with modified independence x3 sessions    Status Deferred      SLP LONG TERM GOAL #3   Title pt will demo [facility] skills in solving min-mod complex  problems in WNL amount of time with modified independence (double checking answers , etc)    Time 8    Period Weeks    Status Partially  Met      SLP LONG TERM GOAL #4   Title pt will demo simple alternating attention in functional cognitive linguistic tasks in 3 sessions    Time 8    Period Weeks    Status Partially Met      SLP LONG TERM GOAL #5   Title Patient will use schedule to complete 3 pre-planned tasks with min cues from spouse.    Time 8    Period Weeks    Status Partially Met            Plan - 05/26/20 1310    Clinical Impression Statement Erika Stanton was receptive to using strategies in structured therapy tasks today, but required moderate cues to do so consistently. Impulsivity impacted her performance however pt verbalized awareness of this and made some attempts to reduce this. Continue skilled ST to maximize independence, cognitive function, and participation in daily activities.    Speech Therapy Frequency 2x / week    Duration 8 weeks    Treatment/Interventions Aspiration precaution training;Pharyngeal strengthening exercises;Compensatory techniques;Diet toleration management by SLP;Trials of upgraded texture/liquids;Cueing hierarchy;Cognitive reorganization;Internal/external aids;Patient/family education;SLP instruction and feedback;Functional tasks    Potential to Achieve Goals Good    Consulted and Agree with Plan of Care Patient;Family member/caregiver           Patient will benefit from skilled therapeutic intervention in order to improve the following deficits and impairments:   Cognitive communication deficit    Problem List Patient Active Problem List   Diagnosis Date Noted  . Dyspareunia in female 04/05/2020  . History of abnormal cervical Pap smear 03/10/2020  . GIB (gastrointestinal bleeding) 02/28/2020  . Elevated BUN   . Prediabetes   . 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[place]) 01/20/2020  . Cerebral vasospasm   . Sinus tachycardia   . Dysphagia,  post-stroke   . Thrombocytopenia ([place])   . Acute blood loss anemia   . Brain aneurysm   . ICH (intracerebral hemorrhage) ([place]) 01/05/2020  . History of adenomatous polyp of colon 02/07/2016  . 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 Fissure in ano 11/11/2014  . Hemorrhoids 11/11/2014  . Constipation 11/19/2013  . GERD (gastroesophageal reflux disease) 03/24/2013  . Irritable bowel syndrome with diarrhea 03/24/2013  . Herpes zoster 05/14/2005  . Abnormal Pap smear of cervix 05/15/1983  . History of cervical dysplasia 05/15/1983   [name], [place], Lena E Adyan Palau 05/26/2020, 1:12 PM  [place] MAIN [facility] SERVICES [address] [place], [place], [address] Phone: [phone]   Fax:  [phone]   Name: Erika Stanton MRN: [id number] Date of Birth: 1955/06/10

## 2020-05-27 ENCOUNTER — Other Ambulatory Visit: Payer: Self-pay

## 2020-05-27 ENCOUNTER — Ambulatory Visit: Payer: BC Managed Care – PPO | Admitting: Occupational Therapy

## 2020-05-27 DIAGNOSIS — R2681 Unsteadiness on feet: Secondary | ICD-10-CM

## 2020-05-27 DIAGNOSIS — M6281 Muscle weakness (generalized): Secondary | ICD-10-CM | POA: Diagnosis not present

## 2020-05-27 DIAGNOSIS — R278 Other lack of coordination: Secondary | ICD-10-CM

## 2020-05-27 DIAGNOSIS — I69159 Hemiplegia and hemiparesis following nontraumatic intracerebral hemorrhage affecting unspecified side: Secondary | ICD-10-CM

## 2020-05-28 ENCOUNTER — Encounter: Payer: Self-pay | Admitting: Occupational Therapy

## 2020-05-28 NOTE — Therapy (Signed)
[place] MAIN [facility] SERVICES [address] [place], [place], [address] Phone: [phone]   Fax:  [phone]  Occupational Therapy Treatment  Patient Details  Name: Erika Stanton MRN: [id number] Date of Birth: Feb 12, 1956 Referring Provider (OT): Dr. Naaman Plummer   Encounter Date: 05/26/2020   OT End of Session - 05/28/20 1137    Visit Number 25    Number of Visits 36    Date for OT Re-Evaluation 06/08/20    Authorization Time Period State [employer]    OT Start Time 1017    OT Stop Time 1100    OT Time Calculation (min) 43 min    Activity Tolerance Patient tolerated treatment well    Behavior During Therapy [facility] for tasks assessed/performed           Past Medical History:  Diagnosis Date  . Aneurysm ([place])   . Melena     Past Surgical History:  Procedure Laterality Date  . CHOLECYSTECTOMY    . COLONOSCOPY  11/2017   at [facility]. no recurrent polyps.  suggest repeat surveillance study 11/2022.    Marland Kitchen COLONOSCOPY W/ POLYPECTOMY  06/2014   Dr Roxy Manns at [facility].  3 adenomatoous polyps, anal fissure.    . ESOPHAGOGASTRODUODENOSCOPY (EGD) WITH PROPOFOL N/A 01/27/2020   Procedure: ESOPHAGOGASTRODUODENOSCOPY (EGD) WITH PROPOFOL;  Surgeon: Mauri Pole, MD;  Location: [facility];  Service: Endoscopy;  Laterality: N/A;  . IR 3D INDEPENDENT WKST  01/06/2020  . IR ANGIO INTRA EXTRACRAN SEL INTERNAL CAROTID BILAT MOD SED  01/06/2020  . IR ANGIO VERTEBRAL SEL VERTEBRAL UNI L MOD SED  01/06/2020  . IR ANGIOGRAM FOLLOW UP STUDY  01/06/2020  . IR ANGIOGRAM FOLLOW UP STUDY  01/06/2020  . IR ANGIOGRAM FOLLOW UP STUDY  01/06/2020  . IR ANGIOGRAM FOLLOW UP STUDY  01/06/2020  . IR ANGIOGRAM FOLLOW UP STUDY  01/06/2020  . IR ANGIOGRAM FOLLOW UP STUDY  01/06/2020  . IR ANGIOGRAM FOLLOW UP STUDY  01/06/2020  . IR ANGIOGRAM FOLLOW UP STUDY  01/06/2020  . IR ANGIOGRAM FOLLOW UP STUDY  01/06/2020  . IR ANGIOGRAM FOLLOW UP STUDY  01/06/2020  . IR NEURO EACH ADD'L AFTER BASIC  UNI RIGHT (MS)  01/06/2020  . IR TRANSCATH/EMBOLIZ  01/06/2020  . RADIOLOGY WITH ANESTHESIA N/A 01/06/2020   Procedure: IR WITH ANESTHESIA FOR ANEURYSM;  Surgeon: Consuella Lose, MD;  Location: [facility];  Service: Radiology;  Laterality: N/A;  . TUBAL LIGATION      There were no vitals filed for this visit.   Subjective Assessment - 05/28/20 1135    Subjective  Pt reports she would like to sit to do exercises today.    Pertinent History ICH 01/06/20, Aneurysm. PMH: OA bilateral knees and hands    Patient Stated Goals Pt would like to be as independent as possible.    Currently in Pain? Yes    Pain Score 4     Pain Orientation Right    Pain Descriptors / Indicators Aching    Pain Type Chronic pain    Pain Onset More than a month ago                There were no vitals filed for this visit.   Subjective Assessment - 05/28/20 1135    Subjective  Pt reports she would like to sit to do exercises today.    Pertinent History ICH 01/06/20, Aneurysm. PMH: OA bilateral knees and hands    Patient Stated Goals Pt would  like to be as independent as possible.    Currently in Pain? Yes    Pain Score 4     Pain Orientation Right    Pain Descriptors / Indicators Aching    Pain Type Chronic pain    Pain Onset More than a month ago          Moist heat for 5 mins to right shoulder and right hand prior to ROM exercises to decrease pain and increase motion.  Manual Therapy:  Pt. tolerated right scapular mobs in sitting for elevation, depression, abduction/rotation. Mobilizations were performed prior to, independent of, and in preparation for ROM.    Therapeutic Exercise:  Patient seen for PROM of RUE with emphasis on shoulder movement in sitting for shoulder flexion, ABD, ADD, elbow flexion/extension, wrist flexion/extension.  Patient limited by pain therefore movement patterns were slow and controlled throughtout the session.  Recommended she work on shoulder ABD and to place a pillow  between arm and body after ROM to help with extended stretching at home.  Focused on hand to mouth patterns today encouraging elbow flexion. Recommend patient work towards self feeding with finger foods.  She reports trying chips at home, suggested fries, nuggets, veggies etc as additional items to try.     Response to tx: Focused more today on shoulder ROM and self feeding motions during session.  Continues to benefit from therapist guided motion to decrease pain and increase motion for daily tasks.  Continued to encourage patient to work towards daily motion in right UE as well as use of UE for self feeding skills.  Will plan to focus next session more on right hand next date since we did not work on the hand as much this date.                 OT Education - 05/28/20 1137    Education Details ROM, pain control, strength    Person(s) Educated Patient    Methods Explanation;Demonstration;Verbal cues;Handout    Comprehension Verbalized understanding;Returned demonstration;Verbal cues required            OT Short Term Goals - 05/09/20 1031      OT SHORT TERM GOAL #3   Title Pt will donn shirt and pants with min A    Baseline Pt. is limited    Time 4    Period Weeks    Status On-going    Target Date 06/08/20      OT SHORT TERM GOAL #4   Title Pt will demonstrate 30 A/ROM shoulder flexion for RUE with pain less than or equal to 4/10.    Baseline Pt. presents with limited right shoulder flexion with pain between 2-4    Time 4    Period Weeks    Status On-going    Target Date 06/08/20      OT SHORT TERM GOAL #5   Title Pt will  demonstrate improved LUE fine motor coordination for ADLs as evidenced by decreasing 9 hole peg test score to 45 secs or less.    Baseline Left Petros 41 sec visa the 9 hole peg test    Time 6    Period Weeks      OT SHORT TERM GOAL #6   Title Pt/ caregiver will verbalize understanding of RUE positioning and edema control techniques to minimize pain  and risk for injury    Baseline Pt. uses a pillow, and has a resting hand slpint,    Time 4  Period Weeks    Status On-going    Target Date 06/08/20      OT SHORT TERM GOAL #7   Title Pt will demonstrate at least 25% finger flexion/ extension in RUE in prep for functional use.    Baseline Limited PROM, and active digit flexion, and extension    Time 4    Period Weeks    Status On-going    Target Date 06/08/20                   Plan - 05/28/20 1139    Clinical Impression Statement Focused more today on shoulder ROM and self feeding motions during session.  Continues to benefit from therapist guided motion to decrease pain and increase motion for daily tasks.  Continued to encourage patient to work towards daily motion in right UE as well as use of UE for self feeding skills.  Will plan to focus next session more on right hand next date since we did not work on the hand as much this date.    OT Occupational Profile and History Detailed Assessment- Review of Records and additional review of physical, cognitive, psychosocial history related to current functional performance    Occupational performance deficits (Please refer to evaluation for details): ADL's;IADL's;Rest and Sleep;Work;Play;Leisure    Body Structure / Function / Physical Skills ADL;Balance;Coordination;Decreased knowledge of precautions;Decreased knowledge of use of DME;Dexterity;Edema;Mobility;Tone;Strength;IADL;Sensation;GMC;Gait;ROM;FMC;Flexibility;Pain;Vision;UE functional use;Endurance    Cognitive Skills Attention;Perception;Problem Solve;Safety Awareness;Thought    Rehab Potential Good    Clinical Decision Making Several treatment options, min-mod task modification necessary    Comorbidities Affecting Occupational Performance: None    Modification or Assistance to Complete Evaluation  Min-Moderate modification of tasks or assist with assess necessary to complete eval    OT Frequency 3x / week    OT Duration 8  weeks    OT Treatment/Interventions Self-care/ADL training;Ultrasound;Energy conservation;Visual/perceptual remediation/compensation;Patient/family education;DME and/or AE instruction;Aquatic Therapy;Paraffin;Gait Training;Passive range of motion;Balance training;Fluidtherapy;Electrical Stimulation;Functional Mobility Training;Splinting;Moist Heat;Therapeutic exercise;Manual Therapy;Cognitive remediation/compensation;Manual lymph drainage;Neuromuscular education;Coping strategies training    Consulted and Agree with Plan of Care Patient;Family member/caregiver           Patient will benefit from skilled therapeutic intervention in order to improve the following deficits and impairments:   Body Structure / Function / Physical Skills: ADL,Balance,Coordination,Decreased knowledge of precautions,Decreased knowledge of use of DME,Dexterity,Edema,Mobility,Tone,Strength,IADL,Sensation,GMC,Gait,ROM,FMC,Flexibility,Pain,Vision,UE functional use,Endurance Cognitive Skills: Attention,Perception,Problem Solve,Safety Awareness,Thought     Visit Diagnosis: Muscle weakness (generalized)  Other lack of coordination  Unsteadiness on feet  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 Dyspareunia in female 04/05/2020  . History of abnormal cervical Pap smear 03/10/2020  . GIB (gastrointestinal bleeding) 02/28/2020  . Elevated BUN   . Prediabetes   . 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[place]) 01/20/2020  . Cerebral vasospasm   . Sinus tachycardia   . Dysphagia, post-stroke   . Thrombocytopenia ([place])   . Acute blood loss anemia   . Brain aneurysm   . ICH (intracerebral hemorrhage) ([place]) 01/05/2020  . History of adenomatous polyp of colon 02/07/2016  . 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 Fissure in ano 11/11/2014  . Hemorrhoids 11/11/2014  . Constipation 11/19/2013  .  GERD (gastroesophageal reflux disease) 03/24/2013  . Irritable bowel syndrome with diarrhea 03/24/2013  . Herpes zoster 05/14/2005  . Abnormal Pap smear of cervix 05/15/1983  . History of cervical dysplasia 05/15/1983   Achilles Dunk,  OTR/L, CLT  Heith Haigler 05/28/2020, 11:51 AM  [place] MAIN [facility] SERVICES [address] [place], [place], [address] Phone: [phone]   Fax:  [phone]  Name: OAKLEIGH CORWIN MRN: [id number] Date of Birth: 02-20-1956

## 2020-05-28 NOTE — Therapy (Signed)
[facility] MAIN [facility] SERVICES [address] [place], [place], [address] Phone: [phone]   Fax:  [phone]  Occupational Therapy Treatment  Patient Details  Name: Erika Stanton MRN: [id number] Date of Birth: [birth year] Referring Provider (OT): Dr. Riley Kill   Encounter Date: 05/24/2020   OT End of Session - 05/28/20 1124    Visit Number 24    Number of Visits 36    Date for OT Re-Evaluation 06/08/20    Authorization Time Period State BCBS    OT Start Time 1016    OT Stop Time 1100    OT Time Calculation (min) 44 min    Activity Tolerance Patient tolerated treatment well    Behavior During Therapy [facility] for tasks assessed/performed           Past Medical History:  Diagnosis Date  . Aneurysm (HCC)   . Melena     Past Surgical History:  Procedure Laterality Date  . CHOLECYSTECTOMY    . COLONOSCOPY  11/2017   at [facility]. no recurrent polyps.  suggest repeat surveillance study 11/2022.    Marland Kitchen COLONOSCOPY W/ POLYPECTOMY  06/2014   Dr Karna Christmas at [facility].  3 adenomatoous polyps, anal fissure.    . ESOPHAGOGASTRODUODENOSCOPY (EGD) WITH PROPOFOL N/A 01/27/2020   Procedure: ESOPHAGOGASTRODUODENOSCOPY (EGD) WITH PROPOFOL;  Surgeon: Napoleon Form, MD;  Location: [facility] END[facility];  Service: Endoscopy;  Laterality: N/A;  . IR 3D INDEPENDENT WKST  01/06/2020  . IR ANGIO INTRA EXTRACRAN SEL INTERNAL CAROTID BILAT MOD SED  01/06/2020  . IR ANGIO VERTEBRAL SEL VERTEBRAL UNI L MOD SED  01/06/2020  . IR ANGIOGRAM FOLLOW UP STUDY  01/06/2020  . IR ANGIOGRAM FOLLOW UP STUDY  01/06/2020  . IR ANGIOGRAM FOLLOW UP STUDY  01/06/2020  . IR ANGIOGRAM FOLLOW UP STUDY  01/06/2020  . IR ANGIOGRAM FOLLOW UP STUDY  01/06/2020  . IR ANGIOGRAM FOLLOW UP STUDY  01/06/2020  . IR ANGIOGRAM FOLLOW UP STUDY  01/06/2020  . IR ANGIOGRAM FOLLOW UP STUDY  01/06/2020  . IR ANGIOGRAM FOLLOW UP STUDY  01/06/2020  . IR ANGIOGRAM FOLLOW UP STUDY  01/06/2020  . IR NEURO EACH ADD'L AFTER BASIC  UNI RIGHT (MS)  01/06/2020  . IR TRANSCATH/EMBOLIZ  01/06/2020  . RADIOLOGY WITH ANESTHESIA N/A 01/06/2020   Procedure: IR WITH ANESTHESIA FOR ANEURYSM;  Surgeon: Lisbeth Renshaw, MD;  Location: [facility] OR;  Service: Radiology;  Laterality: N/A;  . TUBAL LIGATION      There were no vitals filed for this visit.   Subjective Assessment - 05/28/20 1121    Subjective  Patient reports she is doing well, has been trying to do more exercises at home with her husband.    Pertinent History ICH 01/06/20, Aneurysm. PMH: OA bilateral knees and hands    Patient Stated Goals Pt would like to be as independent as possible.    Currently in Pain? Yes    Pain Score 4     Pain Location Shoulder    Pain Orientation Right    Pain Descriptors / Indicators Aching    Pain Type Chronic pain    Pain Onset More than a month ago    Pain Frequency Intermittent    Multiple Pain Sites No          Moist heat for 5 mins to right shoulder and right hand prior to ROM exercises to decrease pain and increase motion.  Manual Therapy:  Pt. tolerated right scapular mobs in sitting for  elevation, depression, abduction/rotation in sitting prior to lying down. Mobilizations were performed prior to, independent of, and in preparation for ROM.    Therapeutic Exercise:   Sit to supine with min assist to manage right UE, pillow under right UE to support arm.  Gentle PROM to UE for shoulder flexion to 55 degrees today, ABD to 60 degrees followed by attempts at [facility], patient able to demonstrate shoulder flexion 30 degrees and ABD 30 degrees.  PROM of right elbow with continued tightness noted at end ranges of extension and flexion, followed by AAROM with gentle resistance with elbow extension today.  PROM to digits with arm in extension followed by AAROM for finger extension, increased movement in index and middle fingers less in ring and small fingers.  Supine to sit with min assist  Response to tx: Patient able to tolerate  supine exercises well, min assist for transfers from sit to supine and supine to sit and for positioning of arm and hand.  Patient still tends to guard right UE from any movement and requires cues and assist to help relax into ROM exercises.  Pain and stiffness initially with movement patterns but improves as ROM continues.  Continue to work towards goals to improve RUE ROM and move towards use as a gross assist.                  OT Education - 05/28/20 1123    Education Details ROM, pain control, strength    Person(s) Educated Patient    Methods Explanation;Demonstration;Verbal cues;Handout    Comprehension Verbalized understanding;Returned demonstration;Verbal cues required            OT Short Term Goals - 05/09/20 1031      OT SHORT TERM GOAL #3   Title Pt will donn shirt and pants with min A    Baseline Pt. is limited    Time 4    Period Weeks    Status On-going    Target Date 06/08/20      OT SHORT TERM GOAL #4   Title Pt will demonstrate 30 A/ROM shoulder flexion for RUE with pain less than or equal to 4/10.    Baseline Pt. presents with limited right shoulder flexion with pain between 2-4    Time 4    Period Weeks    Status On-going    Target Date 06/08/20      OT SHORT TERM GOAL #5   Title Pt will  demonstrate improved LUE fine motor coordination for ADLs as evidenced by decreasing 9 hole peg test score to 45 secs or less.    Baseline Left [place] 41 sec visa the 9 hole peg test    Time 6    Period Weeks      OT SHORT TERM GOAL #6   Title Pt/ caregiver will verbalize understanding of RUE positioning and edema control techniques to minimize pain and risk for injury    Baseline Pt. uses a pillow, and has a resting hand slpint,    Time 4    Period Weeks    Status On-going    Target Date 06/08/20      OT SHORT TERM GOAL #7   Title Pt will demonstrate at least 25% finger flexion/ extension in RUE in prep for functional use.    Baseline Limited PROM, and  active digit flexion, and extension    Time 4    Period Weeks    Status On-going    Target Date 06/08/20  OT Long Term Goals - 05/09/20 1039      OT LONG TERM GOAL #1   Title Pt will perform  all basic ADLs with supervision    Baseline Pt. continues to be limited    Time 8    Period Weeks    Status On-going    Target Date 06/08/20      OT LONG TERM GOAL #2   Title Pt will demonstrate 45* RUE shoulder flexion in prep for functional reach with pain less than or equal to 3/10.    Baseline Pt. presents with limited right shoulder flexion with pain 2-4.    Time 8    Period Weeks    Status On-going    Target Date 06/08/20      OT LONG TERM GOAL #3   Title Pt will use RUE as a stabilizer/ gross A at least 30 % of the time for ADLs/ IADLs.    Baseline Pt. is unalbe to use her right hand as agross assist secondary to limited ROM, and pain.    Time 12    Period Weeks    Status On-going    Target Date 06/08/20      OT LONG TERM GOAL #4   Title Pt will increase LUE grip strength to 25 lbs or greater for increased ease with ADLs.    Baseline Pt. presents with decreased left grip stregth    Time 8    Period Weeks    Status On-going    Target Date 06/08/20      OT LONG TERM GOAL #5   Title Pt will perform simple home management/ snack prep with min A.    Baseline Pt. has difficulty    Time 8    Period Weeks    Status On-going    Target Date 06/08/20      OT LONG TERM GOAL #6   Title Pt will demonstrate ability to grasp/ release a cup 2/3 trials with RUE.    Baseline Pt. is unable to grasp/release a cup.    Time 8    Period Weeks    Status On-going    Target Date 06/08/20      OT LONG TERM GOAL #7   Title Pt will safely navigate a busy environment and locate items with supervision and  90% or greater accuracy without bumping into items.    Baseline TBA    Time 8    Period Weeks    Status On-going    Target Date 06/08/20      OT LONG TERM GOAL #8    Title I with updated HEP.    Baseline Pt. requires assist, and cuing for HEP.    Time 8    Period Weeks    Status On-going    Target Date 06/08/20                 Plan - 05/28/20 1125    Clinical Impression Statement Patient able to tolerate supine exercises well, min assist for transfers from sit to supine and supine to sit and for positioning of arm and hand.  Patient still tends to guard right UE from any movement and requires cues and assist to help relax into ROM exercises.  Pain and stiffness initially with movement patterns but improves as ROM continues.  Continue to work towards goals to improve RUE ROM and move towards use as a gross assist.    OT Occupational Profile and History Detailed Assessment- Review of  Records and additional review of physical, cognitive, psychosocial history related to current functional performance    Occupational performance deficits (Please refer to evaluation for details): ADL's;IADL's;Rest and Sleep;Work;Play;Leisure    Body Structure / Function / Physical Skills ADL;Balance;Coordination;Decreased knowledge of precautions;Decreased knowledge of use of DME;Dexterity;Edema;Mobility;Tone;Strength;IADL;Sensation;GMC;Gait;ROM;FMC;Flexibility;Pain;Vision;UE functional use;Endurance    Cognitive Skills Attention;Perception;Problem Solve;Safety Awareness;Thought    Rehab Potential Good    Clinical Decision Making Several treatment options, min-mod task modification necessary    Comorbidities Affecting Occupational Performance: None    Modification or Assistance to Complete Evaluation  Min-Moderate modification of tasks or assist with assess necessary to complete eval    OT Frequency 3x / week    OT Duration 8 weeks    OT Treatment/Interventions Self-care/ADL training;Ultrasound;Energy conservation;Visual/perceptual remediation/compensation;Patient/family education;DME and/or AE instruction;Aquatic Therapy;Paraffin;Gait Training;Passive range of  motion;Balance training;Fluidtherapy;Electrical Stimulation;Functional Mobility Training;Splinting;Moist Heat;Therapeutic exercise;Manual Therapy;Cognitive remediation/compensation;Manual lymph drainage;Neuromuscular education;Coping strategies training    Consulted and Agree with Plan of Care Patient;Family member/caregiver           Patient will benefit from skilled therapeutic intervention in order to improve the following deficits and impairments:   Body Structure / Function / Physical Skills: ADL,Balance,Coordination,Decreased knowledge of precautions,Decreased knowledge of use of DME,Dexterity,Edema,Mobility,Tone,Strength,IADL,Sensation,GMC,Gait,ROM,FMC,Flexibility,Pain,Vision,UE functional use,Endurance Cognitive Skills: Attention,Perception,Problem Solve,Safety Awareness,Thought     Visit Diagnosis: Muscle weakness (generalized)  Other lack of coordination  Unsteadiness on feet  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 Dyspareunia in female 04/05/2020  . History of abnormal cervical Pap smear 03/10/2020  . GIB (gastrointestinal bleeding) 02/28/2020  . Elevated BUN   . Prediabetes   . 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 Cerebral vasospasm   . Sinus tachycardia   . Dysphagia, post-stroke   . Thrombocytopenia ([place])   . Acute blood loss anemia   . Brain aneurysm   . ICH (intracerebral hemorrhage) ([place]) 01/05/2020  . History of adenomatous polyp of colon 02/07/2016  . 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 Fissure in ano 11/11/2014  . Hemorrhoids 11/11/2014  . Constipation 11/19/2013  . GERD (gastroesophageal reflux disease) 03/24/2013  . Irritable bowel syndrome with diarrhea 03/24/2013  . Herpes zoster 05/14/2005  . Abnormal Pap smear of cervix 05/15/1983  . History of cervical dysplasia 05/15/1983   Achilles Dunk,  OTR/L, CLT  Erika Stanton 05/28/2020, 11:34 AM  [place] MAIN [facility] SERVICES [address] [place], [place], [address] Phone: [phone]   Fax:  [phone]  Name: Erika Stanton MRN: [id number] Date of Birth: 03/14/1956

## 2020-05-28 NOTE — Therapy (Signed)
[place] MAIN [facility] SERVICES [address] [place], [place], [address] Phone: [phone]   Fax:  [phone]  Occupational Therapy Treatment  Patient Details  Name: Erika Stanton MRN: [id number] Date of Birth: Jun 09, 1955 Referring Provider (OT): Dr. Naaman Plummer   Encounter Date: 05/27/2020   OT End of Session - 05/28/20 1154    Visit Number 26    Number of Visits 36    Date for OT Re-Evaluation 06/08/20    Authorization Time Period State BCBS    OT Start Time 0830    OT Stop Time 0915    OT Time Calculation (min) 45 min    Activity Tolerance Patient tolerated treatment well    Behavior During Therapy [facility] for tasks assessed/performed           Past Medical History:  Diagnosis Date  . Aneurysm ([place])   . Melena     Past Surgical History:  Procedure Laterality Date  . CHOLECYSTECTOMY    . COLONOSCOPY  11/2017   at [facility]. no recurrent polyps.  suggest repeat surveillance study 11/2022.    Marland Kitchen COLONOSCOPY W/ POLYPECTOMY  06/2014   Dr Roxy Manns at [facility].  3 adenomatoous polyps, anal fissure.    . ESOPHAGOGASTRODUODENOSCOPY (EGD) WITH PROPOFOL N/A 01/27/2020   Procedure: ESOPHAGOGASTRODUODENOSCOPY (EGD) WITH PROPOFOL;  Surgeon: Mauri Pole, MD;  Location: [place] ENDOSCOPY;  Service: Endoscopy;  Laterality: N/A;  . IR 3D INDEPENDENT WKST  01/06/2020  . IR ANGIO INTRA EXTRACRAN SEL INTERNAL CAROTID BILAT MOD SED  01/06/2020  . IR ANGIO VERTEBRAL SEL VERTEBRAL UNI L MOD SED  01/06/2020  . IR ANGIOGRAM FOLLOW UP STUDY  01/06/2020  . IR ANGIOGRAM FOLLOW UP STUDY  01/06/2020  . IR ANGIOGRAM FOLLOW UP STUDY  01/06/2020  . IR ANGIOGRAM FOLLOW UP STUDY  01/06/2020  . IR ANGIOGRAM FOLLOW UP STUDY  01/06/2020  . IR ANGIOGRAM FOLLOW UP STUDY  01/06/2020  . IR ANGIOGRAM FOLLOW UP STUDY  01/06/2020  . IR ANGIOGRAM FOLLOW UP STUDY  01/06/2020  . IR ANGIOGRAM FOLLOW UP STUDY  01/06/2020  . IR ANGIOGRAM FOLLOW UP STUDY  01/06/2020  . IR NEURO EACH ADD'L AFTER BASIC  UNI RIGHT (MS)  01/06/2020  . IR TRANSCATH/EMBOLIZ  01/06/2020  . RADIOLOGY WITH ANESTHESIA N/A 01/06/2020   Procedure: IR WITH ANESTHESIA FOR ANEURYSM;  Surgeon: Consuella Lose, MD;  Location: [place];  Service: Radiology;  Laterality: N/A;  . TUBAL LIGATION      There were no vitals filed for this visit.   Subjective Assessment - 05/28/20 1153    Subjective  Pt with no complaints, reports not much has changed since seeing her yesterday.  Planning to work on self feeding over the weekend as discussed in session yesterday.    Pertinent History ICH 01/06/20, Aneurysm. PMH: OA bilateral knees and hands    Patient Stated Goals Pt would like to be as independent as possible.    Currently in Pain? Yes    Pain Score 4     Pain Location Shoulder    Pain Orientation Right    Pain Descriptors / Indicators Aching    Pain Type Chronic pain    Pain Onset More than a month ago    Pain Frequency Intermittent    Multiple Pain Sites No            Patient reports she is sad today because they had to put down her cat yesterday.   Moist heat to right  shoulder, forearm and hand prior to exercises for 5 mins to prepare the RUE for ROM exercises.    Therapeutic Exercises:  Scapular mobilizations followed by [facility] for shoulder elevation, retraction for 10 reps prior to ROM of arm. PROM to RUE shoulder, elbow, forearm, wrist and hand followed by AAROM to facilitate shoulder flexion in sitting, gentle ROM and rocking motion.  Shoulder ABD to facilitate greater movement to perform underarm hygiene for washing and applying deodorant.  Elbow flexion and extension to facilitate greater hand to mouth patterns with self feeding skills.  Supination/pronation of forearm to also facilitate self feeding skills and for holding items in right hand.  Wrist extension stretch prolonged.  PROM of right hand to each digit and as a unit for grasp and releasing patterns followed by AAROM to work towards holding items as well as  release of items used in daily routine.    Response to tx: Pt continues to work towards improving ROM of RUE to decrease pain and increase motion for daily tasks.  She is starting to work towards using hand for self feeding skills at home.  Finger extension greater with right index and middle finger with greater limitations with ring and small fingers.  Continue to work on grasp and release patterns with hand.  Patient to continue with HEP over the weekend.   Continue to work towards goals in plan of care to maximize safety and independence in necessary daily tasks.                       OT Education - 05/28/20 1154    Education Details ROM, pain control, strength, finger ROM, self feeding    Person(s) Educated Patient    Methods Explanation;Demonstration;Verbal cues;Handout    Comprehension Verbalized understanding;Returned demonstration;Verbal cues required            OT Short Term Goals - 05/09/20 1031      OT SHORT TERM GOAL #3   Title Pt will donn shirt and pants with min A    Baseline Pt. is limited    Time 4    Period Weeks    Status On-going    Target Date 06/08/20      OT SHORT TERM GOAL #4   Title Pt will demonstrate 30 A/ROM shoulder flexion for RUE with pain less than or equal to 4/10.    Baseline Pt. presents with limited right shoulder flexion with pain between 2-4    Time 4    Period Weeks    Status On-going    Target Date 06/08/20      OT SHORT TERM GOAL #5   Title Pt will  demonstrate improved LUE fine motor coordination for ADLs as evidenced by decreasing 9 hole peg test score to 45 secs or less.    Baseline Left [place] 41 sec visa the 9 hole peg test    Time 6    Period Weeks      OT SHORT TERM GOAL #6   Title Pt/ caregiver will verbalize understanding of RUE positioning and edema control techniques to minimize pain and risk for injury    Baseline Pt. uses a pillow, and has a resting hand slpint,    Time 4    Period Weeks    Status  On-going    Target Date 06/08/20      OT SHORT TERM GOAL #7   Title Pt will demonstrate at least 25% finger flexion/ extension in RUE in  prep for functional use.    Baseline Limited PROM, and active digit flexion, and extension    Time 4    Period Weeks    Status On-going    Target Date 06/08/20             OT Long Term Goals - 05/09/20 1039      OT LONG TERM GOAL #1   Title Pt will perform  all basic ADLs with supervision    Baseline Pt. continues to be limited    Time 8    Period Weeks    Status On-going    Target Date 06/08/20      OT LONG TERM GOAL #2   Title Pt will demonstrate 45* RUE shoulder flexion in prep for functional reach with pain less than or equal to 3/10.    Baseline Pt. presents with limited right shoulder flexion with pain 2-4.    Time 8    Period Weeks    Status On-going    Target Date 06/08/20      OT LONG TERM GOAL #3   Title Pt will use RUE as a stabilizer/ gross A at least 30 % of the time for ADLs/ IADLs.    Baseline Pt. is unalbe to use her right hand as agross assist secondary to limited ROM, and pain.    Time 12    Period Weeks    Status On-going    Target Date 06/08/20      OT LONG TERM GOAL #4   Title Pt will increase LUE grip strength to 25 lbs or greater for increased ease with ADLs.    Baseline Pt. presents with decreased left grip stregth    Time 8    Period Weeks    Status On-going    Target Date 06/08/20      OT LONG TERM GOAL #5   Title Pt will perform simple home management/ snack prep with min A.    Baseline Pt. has difficulty    Time 8    Period Weeks    Status On-going    Target Date 06/08/20      OT LONG TERM GOAL #6   Title Pt will demonstrate ability to grasp/ release a cup 2/3 trials with RUE.    Baseline Pt. is unable to grasp/release a cup.    Time 8    Period Weeks    Status On-going    Target Date 06/08/20      OT LONG TERM GOAL #7   Title Pt will safely navigate a busy environment and locate items  with supervision and  90% or greater accuracy without bumping into items.    Baseline TBA    Time 8    Period Weeks    Status On-going    Target Date 06/08/20      OT LONG TERM GOAL #8   Title I with updated HEP.    Baseline Pt. requires assist, and cuing for HEP.    Time 8    Period Weeks    Status On-going    Target Date 06/08/20                 Plan - 05/28/20 1155    Clinical Impression Statement Pt continues to work towards improving ROM of RUE to decrease pain and increase motion for daily tasks.  She is starting to work towards using hand for self feeding skills at home.  Finger extension greater with right index and middle  finger with greater limitations with ring and small fingers.  Continue to work on grasp and release patterns with hand.  Patient to continue with HEP over the weekend.   Continue to work towards goals in plan of care to maximize safety and independence in necessary daily tasks.    OT Occupational Profile and History Detailed Assessment- Review of Records and additional review of physical, cognitive, psychosocial history related to current functional performance    Occupational performance deficits (Please refer to evaluation for details): ADL's;IADL's;Rest and Sleep;Work;Play;Leisure    Body Structure / Function / Physical Skills ADL;Balance;Coordination;Decreased knowledge of precautions;Decreased knowledge of use of DME;Dexterity;Edema;Mobility;Tone;Strength;IADL;Sensation;GMC;Gait;ROM;FMC;Flexibility;Pain;Vision;UE functional use;Endurance    Cognitive Skills Attention;Perception;Problem Solve;Safety Awareness;Thought    Rehab Potential Good    Clinical Decision Making Several treatment options, min-mod task modification necessary    Comorbidities Affecting Occupational Performance: None    Modification or Assistance to Complete Evaluation  Min-Moderate modification of tasks or assist with assess necessary to complete eval    OT Frequency 3x / week     OT Duration 8 weeks    OT Treatment/Interventions Self-care/ADL training;Ultrasound;Energy conservation;Visual/perceptual remediation/compensation;Patient/family education;DME and/or AE instruction;Aquatic Therapy;Paraffin;Gait Training;Passive range of motion;Balance training;Fluidtherapy;Electrical Stimulation;Functional Mobility Training;Splinting;Moist Heat;Therapeutic exercise;Manual Therapy;Cognitive remediation/compensation;Manual lymph drainage;Neuromuscular education;Coping strategies training    Consulted and Agree with Plan of Care Patient;Family member/caregiver           Patient will benefit from skilled therapeutic intervention in order to improve the following deficits and impairments:   Body Structure / Function / Physical Skills: ADL,Balance,Coordination,Decreased knowledge of precautions,Decreased knowledge of use of DME,Dexterity,Edema,Mobility,Tone,Strength,IADL,Sensation,GMC,Gait,ROM,FMC,Flexibility,Pain,Vision,UE functional use,Endurance Cognitive Skills: Attention,Perception,Problem Solve,Safety Awareness,Thought     Visit Diagnosis: Muscle weakness (generalized)  Other lack of coordination  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 Dyspareunia in female 04/05/2020  . History of abnormal cervical Pap smear 03/10/2020  . GIB (gastrointestinal bleeding) 02/28/2020  . Elevated BUN   . Prediabetes   . 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 Cerebral vasospasm   . Sinus tachycardia   . Dysphagia, post-stroke   . Thrombocytopenia ([place])   . Acute blood loss anemia   . Brain aneurysm   . ICH (intracerebral hemorrhage) ([place]) 01/05/2020  . History of adenomatous polyp of colon 02/07/2016  . 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 Fissure in ano 11/11/2014  . Hemorrhoids 11/11/2014  . Constipation  11/19/2013  . GERD (gastroesophageal reflux disease) 03/24/2013  . Irritable bowel syndrome with diarrhea 03/24/2013  . Herpes zoster 05/14/2005  . Abnormal Pap smear of cervix 05/15/1983  . History of cervical dysplasia 05/15/1983   Achilles Dunk, OTR/L, CLT  Yancy Hascall 05/28/2020, 12:07 PM  [place] MAIN [facility] SERVICES [address] [place], [place], [address] Phone: [phone]   Fax:  [phone]  Name: Erika Stanton MRN: [id number] Date of Birth: Dec 23, 1955

## 2020-05-31 ENCOUNTER — Other Ambulatory Visit: Payer: Self-pay

## 2020-05-31 ENCOUNTER — Ambulatory Visit: Payer: BC Managed Care – PPO | Admitting: Occupational Therapy

## 2020-05-31 ENCOUNTER — Ambulatory Visit: Payer: BC Managed Care – PPO

## 2020-05-31 ENCOUNTER — Ambulatory Visit: Payer: BC Managed Care – PPO | Admitting: Speech Pathology

## 2020-05-31 DIAGNOSIS — R41841 Cognitive communication deficit: Secondary | ICD-10-CM

## 2020-05-31 DIAGNOSIS — R2681 Unsteadiness on feet: Secondary | ICD-10-CM

## 2020-05-31 DIAGNOSIS — M6281 Muscle weakness (generalized): Secondary | ICD-10-CM | POA: Diagnosis not present

## 2020-05-31 DIAGNOSIS — R278 Other lack of coordination: Secondary | ICD-10-CM

## 2020-05-31 DIAGNOSIS — I69159 Hemiplegia and hemiparesis following nontraumatic intracerebral hemorrhage affecting unspecified side: Secondary | ICD-10-CM

## 2020-05-31 NOTE — Therapy (Signed)
[place] MAIN [facility] SERVICES [address] [place], [place], [address] Phone: [phone]   Fax:  [phone]  Physical Therapy Treatment  Patient Details  Name: Erika Stanton MRN: [id number] Date of Birth: 1955-10-21 Referring Provider (PT): Alger Simons, MD   Encounter Date: 05/31/2020   PT End of Session - 05/31/20 1449    Visit Number 25    Number of Visits 29    PT Start Time 5726    PT Stop Time 1515    PT Time Calculation (min) 40 min    Equipment Utilized During Treatment Gait belt    Activity Tolerance Patient tolerated treatment well;No increased pain;Patient limited by fatigue    Behavior During Therapy [facility] for tasks assessed/performed           Past Medical History:  Diagnosis Date  . Aneurysm ([place])   . Melena     Past Surgical History:  Procedure Laterality Date  . CHOLECYSTECTOMY    . COLONOSCOPY  11/2017   at [facility]. no recurrent polyps.  suggest repeat surveillance study 11/2022.    Marland Kitchen COLONOSCOPY W/ POLYPECTOMY  06/2014   Dr Roxy Manns at [facility].  3 adenomatoous polyps, anal fissure.    . ESOPHAGOGASTRODUODENOSCOPY (EGD) WITH PROPOFOL N/A 01/27/2020   Procedure: ESOPHAGOGASTRODUODENOSCOPY (EGD) WITH PROPOFOL;  Surgeon: Mauri Pole, MD;  Location: [place] ENDOSCOPY;  Service: Endoscopy;  Laterality: N/A;  . IR 3D INDEPENDENT WKST  01/06/2020  . IR ANGIO INTRA EXTRACRAN SEL INTERNAL CAROTID BILAT MOD SED  01/06/2020  . IR ANGIO VERTEBRAL SEL VERTEBRAL UNI L MOD SED  01/06/2020  . IR ANGIOGRAM FOLLOW UP STUDY  01/06/2020  . IR ANGIOGRAM FOLLOW UP STUDY  01/06/2020  . IR ANGIOGRAM FOLLOW UP STUDY  01/06/2020  . IR ANGIOGRAM FOLLOW UP STUDY  01/06/2020  . IR ANGIOGRAM FOLLOW UP STUDY  01/06/2020  . IR ANGIOGRAM FOLLOW UP STUDY  01/06/2020  . IR ANGIOGRAM FOLLOW UP STUDY  01/06/2020  . IR ANGIOGRAM FOLLOW UP STUDY  01/06/2020  . IR ANGIOGRAM FOLLOW UP STUDY  01/06/2020  . IR ANGIOGRAM FOLLOW UP STUDY  01/06/2020  . IR NEURO EACH  ADD'L AFTER BASIC UNI RIGHT (MS)  01/06/2020  . IR TRANSCATH/EMBOLIZ  01/06/2020  . RADIOLOGY WITH ANESTHESIA N/A 01/06/2020   Procedure: IR WITH ANESTHESIA FOR ANEURYSM;  Surgeon: Consuella Lose, MD;  Location: [place];  Service: Radiology;  Laterality: N/A;  . TUBAL LIGATION      There were no vitals filed for this visit.   Subjective Assessment - 05/31/20 1445    Subjective Patient reports doing well; She reports feeling stronger. States no falls and walking without an assistive device.    Patient is accompained by: Family member    Limitations Walking;Standing;House hold activities    How long can you walk comfortably? Did a little walking yesterday up/down the hallway    Patient Stated Goals Use my right hand like I used to.    Currently in Pain? No/denies    Pain Onset More than a month ago    Pain Onset More than a month ago           There Ex:  NuStep x L2 x 5 mins  (Assist to set up). Patient declined any pain or issues with increased resistance today.  Sit to stand from regular height chair without UE support x 10- VC's for technique to scoot to edge of chair and for slow eccentric control.   Neuro  re-ed:  Static standing in parallel bars - feet wide, feet narrow, staggered- EO then EC x 10 sec each x 3 trials. Patient challenged and presents with unsteadiness with eyes closed but did improve with practice.  Resistive gait using matrix machine 7.5# x 10 feet forward -vc for increased step length- patient responsive and improved without episode of loss of balance.   Side step 7.5# to left x 3 trials  With CGA - patient exhibited mild unsteadiness initially yet improved with practice with no further difficulty. VC's to perform slowly.                       PT Education - 05/31/20 1710    Education Details Verbal cues for correct exercise technique with LE strengthening and balance exercises.    Person(s) Educated Patient    Methods  Explanation;Demonstration;Verbal cues    Comprehension Verbalized understanding;Returned demonstration;Verbal cues required;Need further instruction            PT Short Term Goals - 04/22/20 1957      PT SHORT TERM GOAL #1   Title STGs=LTGs             PT Long Term Goals - 05/31/20 1452      PT LONG TERM GOAL #1   Title Pt will be independent with progressive HEP for balance and strengthening to continue gains on own.    Baseline Pt reports that her PT exercises are going well and doing as instructed. Will continue to progress adding more core stabilization. 1/11: able to do 2-3 times a week;    Time 4    Period Weeks    Status Achieved      PT LONG TERM GOAL #2   Title Pt will increase FGA from 26/30 to 28/30 for improved balance and gait to decrease fall risk.    Baseline 03/23/20 19/30, 04/22/20 26/30, 1/11: 27/30    Time 4    Period Weeks    Status Not Met      PT LONG TERM GOAL #3   Title Pt will perform 5TSS in </=11 secs without UE support in order to indicate improved balance and functional strength    Baseline 16 seconds with L UE support 03/21/20, 04/22/20 13.54 sec without hands from mat, 1/11:17.85 sec with arms across chest from regular height chair;    Time 4    Period Weeks    Status Not Met      PT LONG TERM GOAL #4   Title Pt will ambulate >1000' on varied surfaces independently with no LOB for improved community ambulation without AD.    Baseline 1050' including >300 feet of grass surface with SBA 03/21/20; we worked on ramp and curb also. 04/22/20 1150' mod I/supervision on varied surfaces inside due to cold., 1/11: independent;    Time 4    Period Weeks    Status Achieved                 Plan - 05/31/20 1712    Clinical Impression Statement Patient progressing well- able to perform Nustep with increased resistance without pain or difficulty. Patient demonstrated good balance with resistive gait today yet challenged with balance activities  involving eyes closed. Patient continues to beneift from additional skilled PT intervention to further improve balance, LE strength and mobility for reduced fall risk and improved quality of life    Personal Factors and Comorbidities Age;Comorbidity 1;Profession    Comorbidities B ICHs  Examination-Activity Limitations Bend;Carry;Lift;Locomotion Level;Reach Overhead;Squat;Stairs;Stand;Transfers    Examination-Participation Restrictions Cleaning;Community Activity;Driving;Occupation;Yard Work;Laundry;Meal Prep    Stability/Clinical Decision Making Evolving/Moderate complexity    Rehab Potential Good    PT Frequency 1x / week   then 1x/week for 2 weeks   PT Duration 4 weeks    PT Treatment/Interventions ADLs/Self Care Home Management;Aquatic Therapy;Electrical Stimulation;DME Instruction;Gait training;Stair training;Functional mobility training;Therapeutic activities;Therapeutic exercise;Balance training;Neuromuscular re-education;Cognitive remediation;Patient/family education;Orthotic Fit/Training;Manual techniques;Passive range of motion;Dry needling;Energy conservation;Vestibular;Visual/perceptual remediation/compensation    PT Next Visit Plan Continue to work on dynamic standing balance and progressive LE strengthening as appropriate.    Consulted and Agree with Plan of Care Patient           Patient will benefit from skilled therapeutic intervention in order to improve the following deficits and impairments:  Abnormal gait,Decreased activity tolerance,Decreased balance,Decreased cognition,Decreased endurance,Decreased knowledge of use of DME,Decreased mobility,Decreased strength,Impaired perceived functional ability,Impaired flexibility,Impaired sensation,Postural dysfunction,Improper body mechanics,Impaired vision/preception,Impaired UE functional use,Impaired tone  Visit Diagnosis: Muscle weakness (generalized)  Other lack of coordination  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 Dyspareunia in female 04/05/2020  . History of abnormal cervical Pap smear 03/10/2020  . GIB (gastrointestinal bleeding) 02/28/2020  . Elevated BUN   . Prediabetes   . 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 Cerebral vasospasm   . Sinus tachycardia   . Dysphagia, post-stroke   . Thrombocytopenia ([place])   . Acute blood loss anemia   . Brain aneurysm   . ICH (intracerebral hemorrhage) ([place]) 01/05/2020  . History of adenomatous polyp of colon 02/07/2016  . 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 Fissure in ano 11/11/2014  . Hemorrhoids 11/11/2014  . Constipation 11/19/2013  . GERD (gastroesophageal reflux disease) 03/24/2013  . Irritable bowel syndrome with diarrhea 03/24/2013  . Herpes zoster 05/14/2005  . Abnormal Pap smear of cervix 05/15/1983  . History of cervical dysplasia 05/15/1983    Lewis Moccasin, PT 05/31/2020, 5:31 PM  [place] MAIN [facility] SERVICES [address] [place], [place], [address] Phone: [phone]   Fax:  [phone]  Name: Erika Stanton MRN: [id number] Date of Birth: 12/10/1955

## 2020-05-31 NOTE — Therapy (Signed)
[place] MAIN [facility] SERVICES [address] [place], [place], [address] Phone: [phone]   Fax:  [phone]  Occupational Therapy Treatment  Patient Details  Name: Erika Stanton MRN: [id number] Date of Birth: Jul 02, 1955 Referring Provider (OT): Dr. Naaman Plummer   Encounter Date: 05/31/2020   OT End of Session - 05/31/20 1523    Visit Number 27    Number of Visits 36    Date for OT Re-Evaluation 06/08/20    Authorization Time Period State BCBS    OT Start Time 1515    OT Stop Time 1600    OT Time Calculation (min) 45 min    Activity Tolerance Patient tolerated treatment well    Behavior During Therapy [facility] for tasks assessed/performed           Past Medical History:  Diagnosis Date  . Aneurysm ([place])   . Melena     Past Surgical History:  Procedure Laterality Date  . CHOLECYSTECTOMY    . COLONOSCOPY  11/2017   at [facility]. no recurrent polyps.  suggest repeat surveillance study 11/2022.    Marland Kitchen COLONOSCOPY W/ POLYPECTOMY  06/2014   Dr Roxy Manns at [facility]  3 adenomatoous polyps, anal fissure.    . ESOPHAGOGASTRODUODENOSCOPY (EGD) WITH PROPOFOL N/A 01/27/2020   Procedure: ESOPHAGOGASTRODUODENOSCOPY (EGD) WITH PROPOFOL;  Surgeon: Mauri Pole, MD;  Location: [place] ENDOSCOPY;  Service: Endoscopy;  Laterality: N/A;  . IR 3D INDEPENDENT WKST  01/06/2020  . IR ANGIO INTRA EXTRACRAN SEL INTERNAL CAROTID BILAT MOD SED  01/06/2020  . IR ANGIO VERTEBRAL SEL VERTEBRAL UNI L MOD SED  01/06/2020  . IR ANGIOGRAM FOLLOW UP STUDY  01/06/2020  . IR ANGIOGRAM FOLLOW UP STUDY  01/06/2020  . IR ANGIOGRAM FOLLOW UP STUDY  01/06/2020  . IR ANGIOGRAM FOLLOW UP STUDY  01/06/2020  . IR ANGIOGRAM FOLLOW UP STUDY  01/06/2020  . IR ANGIOGRAM FOLLOW UP STUDY  01/06/2020  . IR ANGIOGRAM FOLLOW UP STUDY  01/06/2020  . IR ANGIOGRAM FOLLOW UP STUDY  01/06/2020  . IR ANGIOGRAM FOLLOW UP STUDY  01/06/2020  . IR ANGIOGRAM FOLLOW UP STUDY  01/06/2020  . IR NEURO EACH ADD'L AFTER BASIC  UNI RIGHT (MS)  01/06/2020  . IR TRANSCATH/EMBOLIZ  01/06/2020  . RADIOLOGY WITH ANESTHESIA N/A 01/06/2020   Procedure: IR WITH ANESTHESIA FOR ANEURYSM;  Surgeon: Consuella Lose, MD;  Location: [place];  Service: Radiology;  Laterality: N/A;  . TUBAL LIGATION      There were no vitals filed for this visit.   Subjective Assessment - 05/31/20 1521    Subjective  Pt reports they didn't have any trouble today getting out after the bad weather.    Patient Stated Goals Pt would like to be as independent as possible.    Currently in Pain? Yes    Pain Score 2     Pain Location Shoulder    Pain Orientation Right    Pain Descriptors / Indicators Aching    Pain Type Chronic pain    Pain Onset More than a month ago    Pain Frequency Intermittent           Pt reports she used chips to bring hand to mouth and wants to get some apple slices.  She forgot to work on placing a pillow between her right arm and body to increase ABD of the arm.    Moist heat to right shoulder and hand for 5 mins prior to therapeutic exercises.  Patient seen for ROM in sitting, shoulder flexion, ABD, elbow flexion/extension, supination/pronation and digit flexion/extension passively followed by AAROM throughout available movements.  Patient tends to keep arm in supinated position and elbow flexed and benefits from prolonged stretching and facilitation of movements.  Still has some pain at the base of the right thumb which she feels is due to arthritis.  Tightness noted at thumb and web space, gentle prolonged stretching to open up web space and also placing small object to web space for a more prolonged gentle stretch, used medication bottles today starting off with small one and moving up in size as she tolerates the stretch.  Instructed to also work on at home.    Grasping and releasing of medication bottles with right hand, performed in standing and therapist assisting with guiding of right UE, facilitation of finger  extension with attempts to release and flexion with grasping.  Grasping of small smaller diameter objects, unable to perform with a cone yet.    Additional focus on pronation of forearm with attempts to pick up items, requires assist and guiding from therapist.     Response to tx: Patient continues to work towards increased active movement and use of right hand as a gross assist.  Recommend patient engage in purposeful tasks which helps to switch the focus off the hand and helps to engage her in the activity.  Continued tightness in RUE and needs to focus on performing passive ROM to digits to avoid contractures of the hand.  Tightness at PIPs in all joints of the right hand, especially ring and small fingers.  Continue to work towards goals in plan of care to maximize safety and independence in daily ADL and IADL tasks.                      OT Education - 05/31/20 1522    Education Details ROM, pain control, strength, finger ROM, self feeding    Person(s) Educated Patient    Methods Explanation;Demonstration;Verbal cues;Handout    Comprehension Verbalized understanding;Returned demonstration;Verbal cues required            OT Short Term Goals - 05/09/20 1031      OT SHORT TERM GOAL #3   Title Pt will donn shirt and pants with min A    Baseline Pt. is limited    Time 4    Period Weeks    Status On-going    Target Date 06/08/20      OT SHORT TERM GOAL #4   Title Pt will demonstrate 30 A/ROM shoulder flexion for RUE with pain less than or equal to 4/10.    Baseline Pt. presents with limited right shoulder flexion with pain between 2-4    Time 4    Period Weeks    Status On-going    Target Date 06/08/20      OT SHORT TERM GOAL #5   Title Pt will  demonstrate improved LUE fine motor coordination for ADLs as evidenced by decreasing 9 hole peg test score to 45 secs or less.    Baseline Left FMC 41 sec visa the 9 hole peg test    Time 6    Period Weeks      OT SHORT  TERM GOAL #6   Title Pt/ caregiver will verbalize understanding of RUE positioning and edema control techniques to minimize pain and risk for injury    Baseline Pt. uses a pillow, and has a resting hand slpint,  Time 4    Period Weeks    Status On-going    Target Date 06/08/20      OT SHORT TERM GOAL #7   Title Pt will demonstrate at least 25% finger flexion/ extension in RUE in prep for functional use.    Baseline Limited PROM, and active digit flexion, and extension    Time 4    Period Weeks    Status On-going    Target Date 06/08/20             OT Long Term Goals - 05/09/20 1039      OT LONG TERM GOAL #1   Title Pt will perform  all basic ADLs with supervision    Baseline Pt. continues to be limited    Time 8    Period Weeks    Status On-going    Target Date 06/08/20      OT LONG TERM GOAL #2   Title Pt will demonstrate 45* RUE shoulder flexion in prep for functional reach with pain less than or equal to 3/10.    Baseline Pt. presents with limited right shoulder flexion with pain 2-4.    Time 8    Period Weeks    Status On-going    Target Date 06/08/20      OT LONG TERM GOAL #3   Title Pt will use RUE as a stabilizer/ gross A at least 30 % of the time for ADLs/ IADLs.    Baseline Pt. is unalbe to use her right hand as agross assist secondary to limited ROM, and pain.    Time 12    Period Weeks    Status On-going    Target Date 06/08/20      OT LONG TERM GOAL #4   Title Pt will increase LUE grip strength to 25 lbs or greater for increased ease with ADLs.    Baseline Pt. presents with decreased left grip stregth    Time 8    Period Weeks    Status On-going    Target Date 06/08/20      OT LONG TERM GOAL #5   Title Pt will perform simple home management/ snack prep with min A.    Baseline Pt. has difficulty    Time 8    Period Weeks    Status On-going    Target Date 06/08/20      OT LONG TERM GOAL #6   Title Pt will demonstrate ability to grasp/  release a cup 2/3 trials with RUE.    Baseline Pt. is unable to grasp/release a cup.    Time 8    Period Weeks    Status On-going    Target Date 06/08/20      OT LONG TERM GOAL #7   Title Pt will safely navigate a busy environment and locate items with supervision and  90% or greater accuracy without bumping into items.    Baseline TBA    Time 8    Period Weeks    Status On-going    Target Date 06/08/20      OT LONG TERM GOAL #8   Title I with updated HEP.    Baseline Pt. requires assist, and cuing for HEP.    Time 8    Period Weeks    Status On-going    Target Date 06/08/20                 Plan - 05/31/20 1523    Clinical Impression  Statement Patient continues to work towards increased active movement and use of right hand as a gross assist.  Recommend patient engage in purposeful tasks which helps to switch the focus off the hand and helps to engage her in the activity.  Continued tightness in RUE and needs to focus on performing passive ROM to digits to avoid contractures of the hand.  Tightness at PIPs in all joints of the right hand, especially ring and small fingers.  Continue to work towards goals in plan of care to maximize safety and independence in daily ADL and IADL tasks.    OT Occupational Profile and History Detailed Assessment- Review of Records and additional review of physical, cognitive, psychosocial history related to current functional performance    Occupational performance deficits (Please refer to evaluation for details): ADL's;IADL's;Rest and Sleep;Work;Play;Leisure    Body Structure / Function / Physical Skills ADL;Balance;Coordination;Decreased knowledge of precautions;Decreased knowledge of use of DME;Dexterity;Edema;Mobility;Tone;Strength;IADL;Sensation;GMC;Gait;ROM;FMC;Flexibility;Pain;Vision;UE functional use;Endurance    Cognitive Skills Attention;Perception;Problem Solve;Safety Awareness;Thought    Rehab Potential Good    Clinical Decision Making  Several treatment options, min-mod task modification necessary    Comorbidities Affecting Occupational Performance: None    Modification or Assistance to Complete Evaluation  Min-Moderate modification of tasks or assist with assess necessary to complete eval    OT Frequency 3x / week    OT Duration 8 weeks    OT Treatment/Interventions Self-care/ADL training;Ultrasound;Energy conservation;Visual/perceptual remediation/compensation;Patient/family education;DME and/or AE instruction;Aquatic Therapy;Paraffin;Gait Training;Passive range of motion;Balance training;Fluidtherapy;Electrical Stimulation;Functional Mobility Training;Splinting;Moist Heat;Therapeutic exercise;Manual Therapy;Cognitive remediation/compensation;Manual lymph drainage;Neuromuscular education;Coping strategies training    Consulted and Agree with Plan of Care Patient;Family member/caregiver           Patient will benefit from skilled therapeutic intervention in order to improve the following deficits and impairments:   Body Structure / Function / Physical Skills: ADL,Balance,Coordination,Decreased knowledge of precautions,Decreased knowledge of use of DME,Dexterity,Edema,Mobility,Tone,Strength,IADL,Sensation,GMC,Gait,ROM,FMC,Flexibility,Pain,Vision,UE functional use,Endurance Cognitive Skills: Attention,Perception,Problem Solve,Safety Awareness,Thought     Visit Diagnosis: Muscle weakness (generalized)  Other lack of coordination  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, unspecified laterality ([facility])  Unsteadiness on feet    Problem List Patient Active Problem List   Diagnosis Date Noted  . Dyspareunia in female 04/05/2020  . History of abnormal cervical Pap smear 03/10/2020  . GIB (gastrointestinal bleeding) 02/28/2020  . Elevated BUN   . Prediabetes   . 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 Cerebral vasospasm   . Sinus tachycardia   . Dysphagia, post-stroke   .  Thrombocytopenia ([facility])   . Acute blood loss anemia   . Brain aneurysm   . ICH (intracerebral hemorrhage) ([facility]) 01/05/2020  . History of adenomatous polyp of colon 02/07/2016  . 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 Fissure in ano 11/11/2014  . Hemorrhoids 11/11/2014  . Constipation 11/19/2013  . GERD (gastroesophageal reflux disease) 03/24/2013  . Irritable bowel syndrome with diarrhea 03/24/2013  . Herpes zoster 05/14/2005  . Abnormal Pap smear of cervix 05/15/1983  . History of cervical dysplasia 05/15/1983   Kerrie Buffalo, OTR/L, CLT  Gracilyn Gunia 06/01/2020, 10:46 AM  [place] [facility] MAIN [facility] SERVICES [address] [place], [place], [address] Phone: [phone]   Fax:  [phone]  Name: CORRY STORIE MRN: [id number] Date of Birth: 1955-11-03

## 2020-05-31 NOTE — Therapy (Signed)
[place] [facility] SERVICES [address] [place], [place], [address] Phone: [phone]   Fax:  [phone]  Speech Language Pathology Treatment  Patient Details  Name: Erika Stanton MRN: [id number] Date of Birth: 1956/03/18 Referring Provider (SLP): Alger Simons, MD   Encounter Date: 05/31/2020   End of Session - 05/31/20 1727    Visit Number 22    Number of Visits 33    Date for SLP Re-Evaluation 07/09/20    Authorization - Visit Number 2    Progress Note Due on Visit 10    SLP Start Time 1601    SLP Stop Time  9449    SLP Time Calculation (min) 50 min    Activity Tolerance Patient tolerated treatment well           Past Medical History:  Diagnosis Date  . Aneurysm ([place])   . Melena     Past Surgical History:  Procedure Laterality Date  . CHOLECYSTECTOMY    . COLONOSCOPY  11/2017   at [facility]. no recurrent polyps.  suggest repeat surveillance study 11/2022.    Marland Kitchen COLONOSCOPY W/ POLYPECTOMY  06/2014   Dr Roxy Manns at [facility].  3 adenomatoous polyps, anal fissure.    . ESOPHAGOGASTRODUODENOSCOPY (EGD) WITH PROPOFOL N/A 01/27/2020   Procedure: ESOPHAGOGASTRODUODENOSCOPY (EGD) WITH PROPOFOL;  Surgeon: Mauri Pole, MD;  Location: [place] [facility];  Service: Endoscopy;  Laterality: N/A;  . IR 3D INDEPENDENT WKST  01/06/2020  . IR ANGIO INTRA EXTRACRAN SEL INTERNAL CAROTID BILAT MOD SED  01/06/2020  . IR ANGIO VERTEBRAL SEL VERTEBRAL UNI L MOD SED  01/06/2020  . IR ANGIOGRAM FOLLOW UP STUDY  01/06/2020  . IR ANGIOGRAM FOLLOW UP STUDY  01/06/2020  . IR ANGIOGRAM FOLLOW UP STUDY  01/06/2020  . IR ANGIOGRAM FOLLOW UP STUDY  01/06/2020  . IR ANGIOGRAM FOLLOW UP STUDY  01/06/2020  . IR ANGIOGRAM FOLLOW UP STUDY  01/06/2020  . IR ANGIOGRAM FOLLOW UP STUDY  01/06/2020  . IR ANGIOGRAM FOLLOW UP STUDY  01/06/2020  . IR ANGIOGRAM FOLLOW UP STUDY  01/06/2020  . IR ANGIOGRAM FOLLOW UP STUDY  01/06/2020  . IR NEURO EACH ADD'L AFTER BASIC UNI RIGHT (MS)   01/06/2020  . IR TRANSCATH/EMBOLIZ  01/06/2020  . RADIOLOGY WITH ANESTHESIA N/A 01/06/2020   Procedure: IR WITH ANESTHESIA FOR ANEURYSM;  Surgeon: Consuella Lose, MD;  Location: [place];  Service: Radiology;  Laterality: N/A;  . TUBAL LIGATION      There were no vitals filed for this visit.   Subjective Assessment - 05/31/20 1721    Subjective "I have to have a biopsy."    Currently in Pain? Yes    Pain Score 2     Pain Location Shoulder                 ADULT SLP TREATMENT - 05/31/20 1722      General Information   Behavior/Cognition Alert;Cooperative    HPI Pt presented with headache and lt hemiplegia on 01-05-20. 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 Stent and embolization of rt ICA on 01-06-20. Pt rec'd ST on CIR for cognition and swallowing and was d/c'd on dys III/thin with supervision level cues for swallow precautions. Erika Stanton was d/c'd 02-23-20.      Treatment Provided   Treatment provided  Cognitive-Linquistic      Cognitive-Linquistic Treatment   Treatment focused on Cognition;Patient/family/caregiver education    Skilled Treatment Patient discussed disability status and monitoring her claim. Discussed that she may want to hire a lawyer and SLP encouraged pt to do research as part of a cognitive exercise for home, such as making a list of potential candidates/ reviews. Patient agreed and with min cues added this suggestion to her to-do list on her phone. Pt also ID'd another appropriate task for her to-do list. She has continued using this app to send her husband the grocery list. SLP suggested using notes app to help her organize her thoughts and prepare for medical appointments by listing questions for her providers, and pt agreed this would be helpful. For executive function/problem solving tasks today,  pt accuracy was 75%; she required moderate cues for task persistence and to identify/break down tasks into smaller steps.      Assessment / Recommendations / Plan   Plan Continue with current plan of care      Progression Toward Goals   Progression toward goals Progressing toward goals            SLP Education - 05/31/20 1726    Education Details need to do the things that are "hard" if she wants to improve    Person(s) Educated Patient    Methods Explanation    Comprehension Verbalized understanding;Need further instruction            SLP Short Term Goals - 05/24/20 1629      SLP SHORT TERM GOAL #1   Title pt to complete cognitive linguistic testing    Period --   or 4 total sessions   Status Achieved      SLP SHORT TERM GOAL #2   Title pt will demo emergent awareness 100% with min complex therapy tasks x3 visits    Baseline 03-30-20;    Status Partially Met      SLP SHORT TERM GOAL #3   Title pt will demo appriopriate aspiration precuations with suggested POs x3 sessions    Status Deferred      SLP SHORT TERM GOAL #4   Title pt will solve household probelms using functional solutions x3 sessions    Baseline 03-30-20; 04-11-20    Status Achieved      SLP SHORT TERM GOAL #5   Title pt will demo selective attention (internal distraction) to a simple functional task for 10 minutes with rare min A back to task in 3 sessions    Baseline 03-28-20    Period Weeks    Status Achieved      SLP SHORT TERM GOAL #6   Title Patient will preplan daily schedule 5/7 days.    Time 4    Period Weeks    Status New            SLP Long Term Goals - 05/24/20 1630      SLP LONG TERM GOAL #1   Title pt will demo anticipatory awareness by double checking all work with a non-verbal cue x3 sessions    Time 8    Period Weeks   or 17 total sessions, for all LTGs   Status Partially Met      SLP LONG TERM GOAL #2   Title pt will follow swallow precautions with POs with modified  independence x3 sessions    Status Deferred      SLP LONG TERM GOAL #3   Title pt will  demo [facility] skills in solving min-mod complex problems in WNL amount of time with modified independence (double checking answers , etc)    Time 8    Period Weeks    Status Partially Met      SLP LONG TERM GOAL #4   Title pt will demo simple alternating attention in functional cognitive linguistic tasks in 3 sessions    Time 8    Period Weeks    Status Partially Met      SLP LONG TERM GOAL #5   Title Patient will use schedule to complete 3 pre-planned tasks with min cues from spouse.    Time 8    Period Weeks    Status Partially Met            Plan - 05/31/20 1727    Clinical Impression Statement Erika Stanton using notes app on her phone today with min cues to assist with recall/task management. With encouragement she completes functional problem solving tasks but requires cues to break down into more manageable steps, take things one step at a time.  Continue skilled ST to maximize independence, cognitive function, and participation in daily activities.    Speech Therapy Frequency 2x / week    Duration 8 weeks    Treatment/Interventions Aspiration precaution training;Pharyngeal strengthening exercises;Compensatory techniques;Diet toleration management by SLP;Trials of upgraded texture/liquids;Cueing hierarchy;Cognitive reorganization;Internal/external aids;Patient/family education;SLP instruction and feedback;Functional tasks    Potential to Achieve Goals Good    Consulted and Agree with Plan of Care Patient;Family member/caregiver           Patient will benefit from skilled therapeutic intervention in order to improve the following deficits and impairments:   Cognitive communication deficit    Problem List Patient Active Problem List   Diagnosis Date Noted  . Dyspareunia in female 04/05/2020  . History of abnormal cervical Pap smear 03/10/2020  . GIB (gastrointestinal bleeding) 02/28/2020  .  Elevated BUN   . Prediabetes   . 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 Cerebral vasospasm   . Sinus tachycardia   . Dysphagia, post-stroke   . Thrombocytopenia ([place])   . Acute blood loss anemia   . Brain aneurysm   . ICH (intracerebral hemorrhage) ([place]) 01/05/2020  . History of adenomatous polyp of colon 02/07/2016  . 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 Fissure in ano 11/11/2014  . Hemorrhoids 11/11/2014  . Constipation 11/19/2013  . GERD (gastroesophageal reflux disease) 03/24/2013  . Irritable bowel syndrome with diarrhea 03/24/2013  . Herpes zoster 05/14/2005  . Abnormal Pap smear of cervix 05/15/1983  . History of cervical dysplasia 05/15/1983   [name], [place], [place] E Erika Stanton 05/31/2020, 5:30 PM  [facility] [address] [place], [place], [address] Phone: [phone]   Fax:  [phone]   Name: Erika Stanton MRN: [id number] Date of Birth: 20-Jun-1955

## 2020-06-01 ENCOUNTER — Encounter: Payer: Self-pay | Admitting: Occupational Therapy

## 2020-06-02 ENCOUNTER — Encounter: Payer: Self-pay | Admitting: Occupational Therapy

## 2020-06-02 ENCOUNTER — Ambulatory Visit: Payer: BC Managed Care – PPO | Admitting: Speech Pathology

## 2020-06-02 ENCOUNTER — Ambulatory Visit: Payer: BC Managed Care – PPO | Admitting: Physical Therapy

## 2020-06-02 ENCOUNTER — Ambulatory Visit: Payer: BC Managed Care – PPO | Admitting: Occupational Therapy

## 2020-06-02 DIAGNOSIS — R41841 Cognitive communication deficit: Secondary | ICD-10-CM

## 2020-06-02 DIAGNOSIS — R278 Other lack of coordination: Secondary | ICD-10-CM

## 2020-06-02 DIAGNOSIS — I69159 Hemiplegia and hemiparesis following nontraumatic intracerebral hemorrhage affecting unspecified side: Secondary | ICD-10-CM

## 2020-06-02 DIAGNOSIS — I607 Nontraumatic subarachnoid hemorrhage from unspecified intracranial artery: Secondary | ICD-10-CM

## 2020-06-02 DIAGNOSIS — M6281 Muscle weakness (generalized): Secondary | ICD-10-CM | POA: Diagnosis not present

## 2020-06-02 NOTE — Therapy (Signed)
[place] [facility] SERVICES [address] [place], [place], [address] Phone: [phone]   Fax:  [phone]  Speech Language Pathology Treatment  Patient Details  Name: Erika Stanton MRN: [id number] Date of Birth: Nov 17, 1955 Referring Provider (SLP): Alger Simons, MD   Encounter Date: 06/02/2020   End of Session - 06/02/20 1254    Visit Number 23    Number of Visits 33    Date for SLP Re-Evaluation 07/09/20    Authorization - Visit Number 3    Progress Note Due on Visit 10    SLP Start Time 1100    SLP Stop Time  1150    SLP Time Calculation (min) 50 min    Activity Tolerance Patient tolerated treatment well           Past Medical History:  Diagnosis Date  . Aneurysm ([place])   . Melena     Past Surgical History:  Procedure Laterality Date  . CHOLECYSTECTOMY    . COLONOSCOPY  11/2017   at [facility]. no recurrent polyps.  suggest repeat surveillance study 11/2022.    Marland Kitchen COLONOSCOPY W/ POLYPECTOMY  06/2014   Dr Roxy Manns at [facility].  3 adenomatoous polyps, anal fissure.    . ESOPHAGOGASTRODUODENOSCOPY (EGD) WITH PROPOFOL N/A 01/27/2020   Procedure: ESOPHAGOGASTRODUODENOSCOPY (EGD) WITH PROPOFOL;  Surgeon: Mauri Pole, MD;  Location: [facility] [facility];  Service: Endoscopy;  Laterality: N/A;  . IR 3D INDEPENDENT WKST  01/06/2020  . IR ANGIO INTRA EXTRACRAN SEL INTERNAL CAROTID BILAT MOD SED  01/06/2020  . IR ANGIO VERTEBRAL SEL VERTEBRAL UNI L MOD SED  01/06/2020  . IR ANGIOGRAM FOLLOW UP STUDY  01/06/2020  . IR ANGIOGRAM FOLLOW UP STUDY  01/06/2020  . IR ANGIOGRAM FOLLOW UP STUDY  01/06/2020  . IR ANGIOGRAM FOLLOW UP STUDY  01/06/2020  . IR ANGIOGRAM FOLLOW UP STUDY  01/06/2020  . IR ANGIOGRAM FOLLOW UP STUDY  01/06/2020  . IR ANGIOGRAM FOLLOW UP STUDY  01/06/2020  . IR ANGIOGRAM FOLLOW UP STUDY  01/06/2020  . IR ANGIOGRAM FOLLOW UP STUDY  01/06/2020  . IR ANGIOGRAM FOLLOW UP STUDY  01/06/2020  . IR NEURO EACH ADD'L AFTER BASIC UNI RIGHT (MS)   01/06/2020  . IR TRANSCATH/EMBOLIZ  01/06/2020  . RADIOLOGY WITH ANESTHESIA N/A 01/06/2020   Procedure: IR WITH ANESTHESIA FOR ANEURYSM;  Surgeon: Consuella Lose, MD;  Location: [place];  Service: Radiology;  Laterality: N/A;  . TUBAL LIGATION      There were no vitals filed for this visit.   Subjective Assessment - 06/02/20 1231    Subjective Husband joined for part of session today    Patient is accompained by: Family member   [name]   Currently in Pain? No/denies                 ADULT SLP TREATMENT - 06/02/20 1232      General Information   Behavior/Cognition Alert;Cooperative    HPI Pt presented with headache and lt hemiplegia on 01-05-20. 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 Stent and embolization of rt ICA on 01-06-20. Pt rec'd ST on CIR for cognition and swallowing and was d/c'd on dys III/thin with supervision level cues for swallow precautions. Erika Stanton was d/c'd 02-23-20.      Treatment Provided   Treatment provided  Cognitive-Linquistic      Pain Assessment   Pain Assessment No/denies pain      Cognitive-Linquistic Treatment   Treatment focused on Cognition;Patient/family/caregiver education    Skilled Treatment Patient reported completing home assignment; she began researching disability lawyers. She and husband continue to use routine at home; he reported they have identified additional tasks pt can do but are not always scheduling these. Suggested using a whiteboard or posting something where pt can choose and then see her tasks for the day. For simple-mod complex problem solving, patient required moderate cues for strategy use, min cues for double-checking her responses, 80% accuracy with double-checking. Alternating attention tasks averaged 90% accuracy overall (initially 96% accurate, but  with increased complexity and mod noise distraction, accuracy decreased to 85%). Occasional cues for impulsivity. Pt homework is to generate a list of questions to ask on a follow-up call about her benefits. Suggested pt keep a call log and notes on her communications re: insurance/disability, and pt agreed this would be helpful. Husband mentioned pt tearful and emotional often; SLP advised that pt may benefit from seeing a counselor or mental health provider, and instructed on how to search for local providers using their insurance website. Encouraged them to discuss with PCP, rehab MD about whether pt may benefit from managing this medically.      Assessment / Recommendations / Plan   Plan Continue with current plan of care      Progression Toward Goals   Progression toward goals Progressing toward goals            SLP Education - 06/02/20 1248    Education Details mental health resources    Person(s) Educated Patient;Spouse    Methods Explanation    Comprehension Verbalized understanding            SLP Short Term Goals - 05/24/20 1629      SLP SHORT TERM GOAL #1   Title pt to complete cognitive linguistic testing    Period --   or 4 total sessions   Status Achieved      SLP SHORT TERM GOAL #2   Title pt will demo emergent awareness 100% with min complex therapy tasks x3 visits    Baseline 03-30-20;    Status Partially Met      SLP SHORT TERM GOAL #3   Title pt will demo appriopriate aspiration precuations with suggested POs x3 sessions    Status Deferred      SLP SHORT TERM GOAL #4   Title pt will solve household probelms using functional solutions x3 sessions    Baseline 03-30-20; 04-11-20    Status Achieved      SLP SHORT TERM GOAL #5   Title pt will demo selective attention (internal distraction) to a simple functional task for 10 minutes with rare min A back to task in 3 sessions    Baseline 03-28-20    Period Weeks    Status Achieved      SLP SHORT TERM GOAL  #6   Title Patient will preplan daily schedule 5/7 days.    Time 4    Period Weeks    Status New            SLP Long Term Goals - 05/24/20 1630      SLP LONG TERM GOAL #1   Title pt will demo anticipatory awareness by double checking all work with a non-verbal cue x3 sessions    Time 8    Period Weeks  or 17 total sessions, for all LTGs   Status Partially Met      SLP LONG TERM GOAL #2   Title pt will follow swallow precautions with POs with modified independence x3 sessions    Status Deferred      SLP LONG TERM GOAL #3   Title pt will demo [facility] skills in solving min-mod complex problems in WNL amount of time with modified independence (double checking answers , etc)    Time 8    Period Weeks    Status Partially Met      SLP LONG TERM GOAL #4   Title pt will demo simple alternating attention in functional cognitive linguistic tasks in 3 sessions    Time 8    Period Weeks    Status Partially Met      SLP LONG TERM GOAL #5   Title Patient will use schedule to complete 3 pre-planned tasks with min cues from spouse.    Time 8    Period Weeks    Status Partially Met            Plan - 06/02/20 1254    Clinical Impression Statement Erika Stanton continues to complete assigned tasks at home. Min cues required for double checking in problem solving tasks today. In general pt took her time with tasks, but when cognitive load/distractions were added she became more impulsive as she made errors. Continue skilled ST to maximize independence, cognitive function, and participation in daily activities.    Speech Therapy Frequency 2x / week    Duration 8 weeks    Treatment/Interventions Aspiration precaution training;Pharyngeal strengthening exercises;Compensatory techniques;Diet toleration management by SLP;Trials of upgraded texture/liquids;Cueing hierarchy;Cognitive reorganization;Internal/external aids;Patient/family education;SLP instruction and feedback;Functional tasks    Potential to  Achieve Goals Good    Consulted and Agree with Plan of Care Patient;Family member/caregiver           Patient will benefit from skilled therapeutic intervention in order to improve the following deficits and impairments:   Cognitive communication deficit  Cerebral aneurysm rupture [facility])    Problem List Patient Active Problem List   Diagnosis Date Noted  . Dyspareunia in female 04/05/2020  . History of abnormal cervical Pap smear 03/10/2020  . GIB (gastrointestinal bleeding) 02/28/2020  . Elevated BUN   . Prediabetes   . 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[place]) 01/20/2020  . Cerebral vasospasm   . Sinus tachycardia   . Dysphagia, post-stroke   . Thrombocytopenia ([place])   . Acute blood loss anemia   . Brain aneurysm   . ICH (intracerebral hemorrhage) ([place]) 01/05/2020  . History of adenomatous polyp of colon 02/07/2016  . 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 Fissure in ano 11/11/2014  . Hemorrhoids 11/11/2014  . Constipation 11/19/2013  . GERD (gastroesophageal reflux disease) 03/24/2013  . Irritable bowel syndrome with diarrhea 03/24/2013  . Herpes zoster 05/14/2005  . Abnormal Pap smear of cervix 05/15/1983  . History of cervical dysplasia 05/15/1983   [name], [place], [place] E Allexis [place] 06/02/2020, 12:57 PM  [place] [facility]IN [facility] SERVICES [address] [place], [place], [address] Phone: [phone]   Fax:  [phone]   Name: Erika Stanton MRN: [id number] Date of Birth: [birth year]

## 2020-06-02 NOTE — Therapy (Signed)
[place] MAIN [facility] SERVICES [address] [place], [place], [address] Phone: [phone]   Fax:  [phone]  Occupational Therapy Treatment  Patient Details  Name: Erika Stanton MRN: [id number] Date of Birth: 1956/03/14 Referring Provider (OT): Dr. Naaman Plummer   Encounter Date: 06/02/2020   OT End of Session - 06/03/20 2118    Visit Number 28    Number of Visits 36    Date for OT Re-Evaluation 06/08/20    Authorization Time Period State [employer]    OT Start Time 1016    OT Stop Time 1100    OT Time Calculation (min) 44 min    Activity Tolerance Patient tolerated treatment well    Behavior During Therapy [facility] for tasks assessed/performed           Past Medical History:  Diagnosis Date  . Aneurysm ([place])   . Melena     Past Surgical History:  Procedure Laterality Date  . CHOLECYSTECTOMY    . COLONOSCOPY  11/2017   at [facility]. no recurrent polyps.  suggest repeat surveillance study 11/2022.    Marland Kitchen COLONOSCOPY W/ POLYPECTOMY  06/2014   Dr Roxy Manns at [facility].  3 adenomatoous polyps, anal fissure.    . ESOPHAGOGASTRODUODENOSCOPY (EGD) WITH PROPOFOL N/A 01/27/2020   Procedure: ESOPHAGOGASTRODUODENOSCOPY (EGD) WITH PROPOFOL;  Surgeon: Mauri Pole, MD;  Location: [place] [facility]OSCOPY;  Service: Endoscopy;  Laterality: N/A;  . IR 3D INDEPENDENT WKST  01/06/2020  . IR ANGIO INTRA EXTRACRAN SEL INTERNAL CAROTID BILAT MOD SED  01/06/2020  . IR ANGIO VERTEBRAL SEL VERTEBRAL UNI L MOD SED  01/06/2020  . IR ANGIOGRAM FOLLOW UP STUDY  01/06/2020  . IR ANGIOGRAM FOLLOW UP STUDY  01/06/2020  . IR ANGIOGRAM FOLLOW UP STUDY  01/06/2020  . IR ANGIOGRAM FOLLOW UP STUDY  01/06/2020  . IR ANGIOGRAM FOLLOW UP STUDY  01/06/2020  . IR ANGIOGRAM FOLLOW UP STUDY  01/06/2020  . IR ANGIOGRAM FOLLOW UP STUDY  01/06/2020  . IR ANGIOGRAM FOLLOW UP STUDY  01/06/2020  . IR ANGIOGRAM FOLLOW UP STUDY  01/06/2020  . IR ANGIOGRAM FOLLOW UP STUDY  01/06/2020  . IR NEURO EACH ADD'L AFTER BASIC  UNI RIGHT (MS)  01/06/2020  . IR TRANSCATH/EMBOLIZ  01/06/2020  . RADIOLOGY WITH ANESTHESIA N/A 01/06/2020   Procedure: IR WITH ANESTHESIA FOR ANEURYSM;  Surgeon: Consuella Lose, MD;  Location: [place];  Service: Radiology;  Laterality: N/A;  . TUBAL LIGATION      There were no vitals filed for this visit.   Moist heat to right shoulder and hand for 5 mins prior to therapeutic exercises.    Shoulder elevation and retraction with therapist guiding through motion on right side.  Patient seen for continued focus on ROM in sitting, shoulder flexion, ABD, elbow flexion/extension, supination/pronation and digit flexion/extension passively followed by AAROM throughout available movements.  Continued tightness noted at thumb and web space, gentle prolonged stretching to open up web space and also placing small object to web space for a more prolonged gentle stretch.  Patient reports she worked on this at home some since last session and feels her hand is a bit looser.    Forward reaching with arm supported on her leg and moving forwards attempting to reach her knee and then returning to starting position.  Multiple repetitions completed followed by forward reaching with therapist guiding right arm.  Therapist guided pronation of right forearm.    Response to tx: Patient continues to progress and  does best when she has a purposeful activity with an end result and/or reaching with emphasis on a target.  Reviewed exercises and ROM for home program.  Will plan to work more on fingers next session.  Continue OT to improve safety and independence with necessary daily tasks.                     OT Education - 06/04/20 2118    Education Details ROM, pain control, strength, finger ROM, self feeding    Person(s) Educated Patient    Methods Explanation;Demonstration;Verbal cues;Handout    Comprehension Verbalized understanding;Returned demonstration;Verbal cues required            OT  Short Term Goals - 05/09/20 1031      OT SHORT TERM GOAL #3   Title Pt will donn shirt and pants with min A    Baseline Pt. is limited    Time 4    Period Weeks    Status On-going    Target Date 06/08/20      OT SHORT TERM GOAL #4   Title Pt will demonstrate 30 A/ROM shoulder flexion for RUE with pain less than or equal to 4/10.    Baseline Pt. presents with limited right shoulder flexion with pain between 2-4    Time 4    Period Weeks    Status On-going    Target Date 06/08/20      OT SHORT TERM GOAL #5   Title Pt will  demonstrate improved LUE fine motor coordination for ADLs as evidenced by decreasing 9 hole peg test score to 45 secs or less.    Baseline Left [place] 41 sec visa the 9 hole peg test    Time 6    Period Weeks      OT SHORT TERM GOAL #6   Title Pt/ caregiver will verbalize understanding of RUE positioning and edema control techniques to minimize pain and risk for injury    Baseline Pt. uses a pillow, and has a resting hand slpint,    Time 4    Period Weeks    Status On-going    Target Date 06/08/20      OT SHORT TERM GOAL #7   Title Pt will demonstrate at least 25% finger flexion/ extension in RUE in prep for functional use.    Baseline Limited PROM, and active digit flexion, and extension    Time 4    Period Weeks    Status On-going    Target Date 06/08/20             OT Long Term Goals - 05/09/20 1039      OT LONG TERM GOAL #1   Title Pt will perform  all basic ADLs with supervision    Baseline Pt. continues to be limited    Time 8    Period Weeks    Status On-going    Target Date 06/08/20      OT LONG TERM GOAL #2   Title Pt will demonstrate 45* RUE shoulder flexion in prep for functional reach with pain less than or equal to 3/10.    Baseline Pt. presents with limited right shoulder flexion with pain 2-4.    Time 8    Period Weeks    Status On-going    Target Date 06/08/20      OT LONG TERM GOAL #3   Title Pt will use RUE as a  stabilizer/ gross A at least 30 %  of the time for ADLs/ IADLs.    Baseline Pt. is unalbe to use her right hand as agross assist secondary to limited ROM, and pain.    Time 12    Period Weeks    Status On-going    Target Date 06/08/20      OT LONG TERM GOAL #4   Title Pt will increase LUE grip strength to 25 lbs or greater for increased ease with ADLs.    Baseline Pt. presents with decreased left grip stregth    Time 8    Period Weeks    Status On-going    Target Date 06/08/20      OT LONG TERM GOAL #5   Title Pt will perform simple home management/ snack prep with min A.    Baseline Pt. has difficulty    Time 8    Period Weeks    Status On-going    Target Date 06/08/20      OT LONG TERM GOAL #6   Title Pt will demonstrate ability to grasp/ release a cup 2/3 trials with RUE.    Baseline Pt. is unable to grasp/release a cup.    Time 8    Period Weeks    Status On-going    Target Date 06/08/20      OT LONG TERM GOAL #7   Title Pt will safely navigate a busy environment and locate items with supervision and  90% or greater accuracy without bumping into items.    Baseline TBA    Time 8    Period Weeks    Status On-going    Target Date 06/08/20      OT LONG TERM GOAL #8   Title I with updated HEP.    Baseline Pt. requires assist, and cuing for HEP.    Time 8    Period Weeks    Status On-going    Target Date 06/08/20                 Plan - 06/04/20 2119    OT Occupational Profile and History Detailed Assessment- Review of Records and additional review of physical, cognitive, psychosocial history related to current functional performance    Occupational performance deficits (Please refer to evaluation for details): ADL's;IADL's;Rest and Sleep;Work;Play;Leisure    Body Structure / Function / Physical Skills ADL;Balance;Coordination;Decreased knowledge of precautions;Decreased knowledge of use of  DME;Dexterity;Edema;Mobility;Tone;Strength;IADL;Sensation;GMC;Gait;ROM;FMC;Flexibility;Pain;Vision;UE functional use;Endurance    Cognitive Skills Attention;Perception;Problem Solve;Safety Awareness;Thought    Rehab Potential Good    Clinical Decision Making Several treatment options, min-mod task modification necessary    Comorbidities Affecting Occupational Performance: None    Modification or Assistance to Complete Evaluation  Min-Moderate modification of tasks or assist with assess necessary to complete eval    OT Frequency 3x / week    OT Duration 8 weeks    OT Treatment/Interventions Self-care/ADL training;Ultrasound;Energy conservation;Visual/perceptual remediation/compensation;Patient/family education;DME and/or AE instruction;Aquatic Therapy;Paraffin;Gait Training;Passive range of motion;Balance training;Fluidtherapy;Electrical Stimulation;Functional Mobility Training;Splinting;Moist Heat;Therapeutic exercise;Manual Therapy;Cognitive remediation/compensation;Manual lymph drainage;Neuromuscular education;Coping strategies training    Consulted and Agree with Plan of Care Patient;Family member/caregiver           Patient will benefit from skilled therapeutic intervention in order to improve the following deficits and impairments:   Body Structure / Function / Physical Skills: ADL,Balance,Coordination,Decreased knowledge of precautions,Decreased knowledge of use of DME,Dexterity,Edema,Mobility,Tone,Strength,IADL,Sensation,GMC,Gait,ROM,FMC,Flexibility,Pain,Vision,UE functional use,Endurance Cognitive Skills: Attention,Perception,Problem Solve,Safety Awareness,Thought     Visit Diagnosis: Muscle weakness (generalized)  Other lack of coordination  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 Dyspareunia in female 04/05/2020  . History of abnormal cervical Pap  smear 03/10/2020  . GIB (gastrointestinal bleeding) 02/28/2020  . Elevated BUN   . Prediabetes   . 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 Cerebral vasospasm   . Sinus tachycardia   . Dysphagia, post-stroke   . Thrombocytopenia ([place])   . Acute blood loss anemia   . Brain aneurysm   . ICH (intracerebral hemorrhage) ([place]) 01/05/2020  . History of adenomatous polyp of colon 02/07/2016  . 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 Fissure in ano 11/11/2014  . Hemorrhoids 11/11/2014  . Constipation 11/19/2013  . GERD (gastroesophageal reflux disease) 03/24/2013  . Irritable bowel syndrome with diarrhea 03/24/2013  . Herpes zoster 05/14/2005  . Abnormal Pap smear of cervix 05/15/1983  . History of cervical dysplasia 05/15/1983   Achilles Dunk, OTR/L, CLT  [place] 06/04/2020, 9:47 PM  [place] MAIN [facility] SERVICES [address] [place], [place], [address] Phone: [phone]   Fax:  [phone]  Name: Erika Stanton MRN: [id number] Date of Birth: 07-18-1955

## 2020-06-03 ENCOUNTER — Other Ambulatory Visit: Payer: Self-pay

## 2020-06-03 ENCOUNTER — Ambulatory Visit: Payer: BC Managed Care – PPO | Admitting: Occupational Therapy

## 2020-06-03 ENCOUNTER — Encounter: Payer: Self-pay | Admitting: Occupational Therapy

## 2020-06-03 DIAGNOSIS — M6281 Muscle weakness (generalized): Secondary | ICD-10-CM | POA: Diagnosis not present

## 2020-06-03 DIAGNOSIS — R278 Other lack of coordination: Secondary | ICD-10-CM

## 2020-06-03 DIAGNOSIS — I69159 Hemiplegia and hemiparesis following nontraumatic intracerebral hemorrhage affecting unspecified side: Secondary | ICD-10-CM

## 2020-06-04 NOTE — Therapy (Signed)
[place] MAIN [facility] SERVICES [address] [place], [place], [address] Phone: [phone]   Fax:  [phone]  Occupational Therapy Treatment  Patient Details  Name: Erika Stanton MRN: [id number] Date of Birth: Nov 13, 1955 Referring Provider (OT): Dr. Naaman Plummer   Encounter Date: 06/03/2020   OT End of Session - 06/04/20 2149    Visit Number 29    Number of Visits 36    Date for OT Re-Evaluation 06/08/20    Authorization Time [place]    OT Start Time 1100    OT Stop Time 1148    OT Time Calculation (min) 48 min    Activity Tolerance Patient tolerated treatment well    Behavior During Therapy [facility] for tasks assessed/performed           Past Medical History:  Diagnosis Date  . Aneurysm ([place])   . Melena     Past Surgical History:  Procedure Laterality Date  . CHOLECYSTECTOMY    . COLONOSCOPY  11/2017   at [facility]. no recurrent polyps.  suggest repeat surveillance study 11/2022.    Marland Kitchen COLONOSCOPY W/ POLYPECTOMY  06/2014   Dr Roxy Manns at [facility].  3 adenomatoous polyps, anal fissure.    . ESOPHAGOGASTRODUODENOSCOPY (EGD) WITH PROPOFOL N/A 01/27/2020   Procedure: ESOPHAGOGASTRODUODENOSCOPY (EGD) WITH PROPOFOL;  Surgeon: Mauri Pole, MD;  Location: [place] [facility];  Service: Endoscopy;  Laterality: N/A;  . IR 3D INDEPENDENT WKST  01/06/2020  . IR ANGIO INTRA EXTRACRAN SEL INTERNAL CAROTID BILAT MOD SED  01/06/2020  . IR ANGIO VERTEBRAL SEL VERTEBRAL UNI L MOD SED  01/06/2020  . IR ANGIOGRAM FOLLOW UP STUDY  01/06/2020  . IR ANGIOGRAM FOLLOW UP STUDY  01/06/2020  . IR ANGIOGRAM FOLLOW UP STUDY  01/06/2020  . IR ANGIOGRAM FOLLOW UP STUDY  01/06/2020  . IR ANGIOGRAM FOLLOW UP STUDY  01/06/2020  . IR ANGIOGRAM FOLLOW UP STUDY  01/06/2020  . IR ANGIOGRAM FOLLOW UP STUDY  01/06/2020  . IR ANGIOGRAM FOLLOW UP STUDY  01/06/2020  . IR ANGIOGRAM FOLLOW UP STUDY  01/06/2020  . IR ANGIOGRAM FOLLOW UP STUDY  01/06/2020  . IR NEURO EACH ADD'L AFTER BASIC  UNI RIGHT (MS)  01/06/2020  . IR TRANSCATH/EMBOLIZ  01/06/2020  . RADIOLOGY WITH ANESTHESIA N/A 01/06/2020   Procedure: IR WITH ANESTHESIA FOR ANEURYSM;  Surgeon: Consuella Lose, MD;  Location: [place];  Service: Radiology;  Laterality: N/A;  . TUBAL LIGATION      There were no vitals filed for this visit.  Moist heat to right shoulder and hand prior to therapeutic exercises for 5 mins to increase blood flow, decrease pain and stiffness with movement patterns.   Shoulder elevation and retraction with therapist guiding through motion on right side.  Patient seen for continued focus on ROM in sitting, shoulder flexion, ABD, elbow flexion/extension, supination/pronation and digit flexion/extension passively followed by AAROM throughout available movements. AAROM thumb and web space, gentle prolonged stretching to open up web space and also placing small object to web space for a more prolonged gentle stretch. Extensive focus on right hand ROM and digit extension, therapist performing ROM at each joint of each individual finger and thumb of right hand while also instructing patient on technique for self directed ROM with demonstration for home program.  Hand over hand assist for proper placement of left hand to assist with right.  Forward reaching task with arm supported on leg and with fingers extended (therapist helping to keep digits  in extension)  Response to tx: Patient has continued to make good progress this week with ROM, stretching, hand to mouth patterns and facilitation of reaching patterns.  Encouraged patient to use right hand as much as possible during functional activities at home.  Continue to work towards goals in plan of care to improve ROM, strength and increased use of right hand in necessary daily tasks.                             OT Education - 06/04/20 2148    Education Details ROM, pain control, strength, finger ROM, self feeding    Person(s) Educated  Patient    Methods Explanation;Demonstration;Verbal cues;Handout    Comprehension Verbalized understanding;Returned demonstration;Verbal cues required            OT Short Term Goals - 05/09/20 1031      OT SHORT TERM GOAL #3   Title Pt will donn shirt and pants with min A    Baseline Pt. is limited    Time 4    Period Weeks    Status On-going    Target Date 06/08/20      OT SHORT TERM GOAL #4   Title Pt will demonstrate 30 A/ROM shoulder flexion for RUE with pain less than or equal to 4/10.    Baseline Pt. presents with limited right shoulder flexion with pain between 2-4    Time 4    Period Weeks    Status On-going    Target Date 06/08/20      OT SHORT TERM GOAL #5   Title Pt will  demonstrate improved LUE fine motor coordination for ADLs as evidenced by decreasing 9 hole peg test score to 45 secs or less.    Baseline Left [place] 41 sec visa the 9 hole peg test    Time 6    Period Weeks      OT SHORT TERM GOAL #6   Title Pt/ caregiver will verbalize understanding of RUE positioning and edema control techniques to minimize pain and risk for injury    Baseline Pt. uses a pillow, and has a resting hand slpint,    Time 4    Period Weeks    Status On-going    Target Date 06/08/20      OT SHORT TERM GOAL #7   Title Pt will demonstrate at least 25% finger flexion/ extension in RUE in prep for functional use.    Baseline Limited PROM, and active digit flexion, and extension    Time 4    Period Weeks    Status On-going    Target Date 06/08/20             OT Long Term Goals - 05/09/20 1039      OT LONG TERM GOAL #1   Title Pt will perform  all basic ADLs with supervision    Baseline Pt. continues to be limited    Time 8    Period Weeks    Status On-going    Target Date 06/08/20      OT LONG TERM GOAL #2   Title Pt will demonstrate 45* RUE shoulder flexion in prep for functional reach with pain less than or equal to 3/10.    Baseline Pt. presents with limited right  shoulder flexion with pain 2-4.    Time 8    Period Weeks    Status On-going    Target Date 06/08/20  OT LONG TERM GOAL #3   Title Pt will use RUE as a stabilizer/ gross A at least 30 % of the time for ADLs/ IADLs.    Baseline Pt. is unalbe to use her right hand as agross assist secondary to limited ROM, and pain.    Time 12    Period Weeks    Status On-going    Target Date 06/08/20      OT LONG TERM GOAL #4   Title Pt will increase LUE grip strength to 25 lbs or greater for increased ease with ADLs.    Baseline Pt. presents with decreased left grip stregth    Time 8    Period Weeks    Status On-going    Target Date 06/08/20      OT LONG TERM GOAL #5   Title Pt will perform simple home management/ snack prep with min A.    Baseline Pt. has difficulty    Time 8    Period Weeks    Status On-going    Target Date 06/08/20      OT LONG TERM GOAL #6   Title Pt will demonstrate ability to grasp/ release a cup 2/3 trials with RUE.    Baseline Pt. is unable to grasp/release a cup.    Time 8    Period Weeks    Status On-going    Target Date 06/08/20      OT LONG TERM GOAL #7   Title Pt will safely navigate a busy environment and locate items with supervision and  90% or greater accuracy without bumping into items.    Baseline TBA    Time 8    Period Weeks    Status On-going    Target Date 06/08/20      OT LONG TERM GOAL #8   Title I with updated HEP.    Baseline Pt. requires assist, and cuing for HEP.    Time 8    Period Weeks    Status On-going    Target Date 06/08/20                 Plan - 06/04/20 2149    Clinical Impression Statement Patient has continued to make good progress this week with ROM, stretching, hand to mouth patterns and facilitation of reaching patterns.  Encouraged patient to use right hand as much as possible during functional activities at home.  Continue to work towards goals in plan of care to improve ROM, strength and increased use of  right hand in necessary daily tasks.    OT Occupational Profile and History Detailed Assessment- Review of Records and additional review of physical, cognitive, psychosocial history related to current functional performance    Occupational performance deficits (Please refer to evaluation for details): ADL's;IADL's;Rest and Sleep;Work;Play;Leisure    Body Structure / Function / Physical Skills ADL;Balance;Coordination;Decreased knowledge of precautions;Decreased knowledge of use of DME;Dexterity;Edema;Mobility;Tone;Strength;IADL;Sensation;GMC;Gait;ROM;FMC;Flexibility;Pain;Vision;UE functional use;Endurance    Cognitive Skills Attention;Perception;Problem Solve;Safety Awareness;Thought    Rehab Potential Good    Clinical Decision Making Several treatment options, min-mod task modification necessary    Comorbidities Affecting Occupational Performance: None    Modification or Assistance to Complete Evaluation  Min-Moderate modification of tasks or assist with assess necessary to complete eval    OT Frequency 3x / week    OT Duration 8 weeks    OT Treatment/Interventions Self-care/ADL training;Ultrasound;Energy conservation;Visual/perceptual remediation/compensation;Patient/family education;DME and/or AE instruction;Aquatic Therapy;Paraffin;Gait Training;Passive range of motion;Balance training;Fluidtherapy;Electrical Stimulation;Functional Mobility Training;Splinting;Moist Heat;Therapeutic exercise;Manual Therapy;Cognitive remediation/compensation;Manual lymph drainage;Neuromuscular  education;Coping strategies training    Consulted and Agree with Plan of Care Patient;Family member/caregiver           Patient will benefit from skilled therapeutic intervention in order to improve the following deficits and impairments:   Body Structure / Function / Physical Skills: ADL,Balance,Coordination,Decreased knowledge of precautions,Decreased knowledge of use of  DME,Dexterity,Edema,Mobility,Tone,Strength,IADL,Sensation,GMC,Gait,ROM,FMC,Flexibility,Pain,Vision,UE functional use,Endurance Cognitive Skills: Attention,Perception,Problem Solve,Safety Awareness,Thought     Visit Diagnosis: Muscle weakness (generalized)  Other lack of coordination  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, unspecified laterality ([facility])    Problem List Patient Active Problem List   Diagnosis Date Noted  . Dyspareunia in female 04/05/2020  . History of abnormal cervical Pap smear 03/10/2020  . GIB (gastrointestinal bleeding) 02/28/2020  . Elevated BUN   . Prediabetes   . 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 Cerebral vasospasm   . Sinus tachycardia   . Dysphagia, post-stroke   . Thrombocytopenia ([facility])   . Acute blood loss anemia   . Brain aneurysm   . ICH (intracerebral hemorrhage) ([facility]) 01/05/2020  . History of adenomatous polyp of colon 02/07/2016  . 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 Fissure in ano 11/11/2014  . Hemorrhoids 11/11/2014  . Constipation 11/19/2013  . GERD (gastroesophageal reflux disease) 03/24/2013  . Irritable bowel syndrome with diarrhea 03/24/2013  . Herpes zoster 05/14/2005  . Abnormal Pap smear of cervix 05/15/1983  . History of cervical dysplasia 05/15/1983   Kerrie Buffalo, OTR/L, CLT  Erika Stanton 06/04/2020, 10:00 PM  [place] [facility] MAIN [facility] SERVICES [address] [place], [place], [address] Phone: [phone]   Fax:  [phone]  Name: Erika Stanton MRN: [id number] Date of Birth: 08-22-1955

## 2020-06-07 ENCOUNTER — Ambulatory Visit: Payer: BC Managed Care – PPO | Admitting: Occupational Therapy

## 2020-06-07 ENCOUNTER — Other Ambulatory Visit: Payer: Self-pay

## 2020-06-07 ENCOUNTER — Ambulatory Visit: Payer: BC Managed Care – PPO | Admitting: Speech Pathology

## 2020-06-07 ENCOUNTER — Ambulatory Visit: Payer: BC Managed Care – PPO

## 2020-06-07 DIAGNOSIS — M6281 Muscle weakness (generalized): Secondary | ICD-10-CM

## 2020-06-07 DIAGNOSIS — I69159 Hemiplegia and hemiparesis following nontraumatic intracerebral hemorrhage affecting unspecified side: Secondary | ICD-10-CM

## 2020-06-07 DIAGNOSIS — R2689 Other abnormalities of gait and mobility: Secondary | ICD-10-CM

## 2020-06-07 DIAGNOSIS — R278 Other lack of coordination: Secondary | ICD-10-CM

## 2020-06-07 DIAGNOSIS — R2681 Unsteadiness on feet: Secondary | ICD-10-CM

## 2020-06-07 NOTE — Therapy (Signed)
[place] MAIN [facility] SERVICES [address] [place], [place], [address] Phone: [phone]   Fax:  [phone]  Occupational Therapy Treatment/Recertification Occupational Therapy Progress Note  Dates of reporting period  03/02/2021  to   06/07/2020  Patient Details  Name: RAYNIE STEINHAUS MRN: [id number] Date of Birth: 1956/02/02 Referring Provider (OT): Dr. Naaman Plummer   Encounter Date: 06/07/2020   OT End of Session - 06/07/20 1225    Visit Number 30    Number of Visits 60    Date for OT Re-Evaluation 08/30/20    OT Start Time 1015    OT Stop Time 1055    OT Time Calculation (min) 40 min    Activity Tolerance Patient tolerated treatment well    Behavior During Therapy [facility] for tasks assessed/performed           Past Medical History:  Diagnosis Date  . Aneurysm ([place])   . Melena     Past Surgical History:  Procedure Laterality Date  . CHOLECYSTECTOMY    . COLONOSCOPY  11/2017   at [facility]. no recurrent polyps.  suggest repeat surveillance study 11/2022.    Marland Kitchen COLONOSCOPY W/ POLYPECTOMY  06/2014   Dr Roxy Manns at [facility].  3 adenomatoous polyps, anal fissure.    . ESOPHAGOGASTRODUODENOSCOPY (EGD) WITH PROPOFOL N/A 01/27/2020   Procedure: ESOPHAGOGASTRODUODENOSCOPY (EGD) WITH PROPOFOL;  Surgeon: Mauri Pole, MD;  Location: [place] [facility];  Service: Endoscopy;  Laterality: N/A;  . IR 3D INDEPENDENT WKST  01/06/2020  . IR ANGIO INTRA EXTRACRAN SEL INTERNAL CAROTID BILAT MOD SED  01/06/2020  . IR ANGIO VERTEBRAL SEL VERTEBRAL UNI L MOD SED  01/06/2020  . IR ANGIOGRAM FOLLOW UP STUDY  01/06/2020  . IR ANGIOGRAM FOLLOW UP STUDY  01/06/2020  . IR ANGIOGRAM FOLLOW UP STUDY  01/06/2020  . IR ANGIOGRAM FOLLOW UP STUDY  01/06/2020  . IR ANGIOGRAM FOLLOW UP STUDY  01/06/2020  . IR ANGIOGRAM FOLLOW UP STUDY  01/06/2020  . IR ANGIOGRAM FOLLOW UP STUDY  01/06/2020  . IR ANGIOGRAM FOLLOW UP STUDY  01/06/2020  . IR ANGIOGRAM FOLLOW UP STUDY  01/06/2020  . IR  ANGIOGRAM FOLLOW UP STUDY  01/06/2020  . IR NEURO EACH ADD'L AFTER BASIC UNI RIGHT (MS)  01/06/2020  . IR TRANSCATH/EMBOLIZ  01/06/2020  . RADIOLOGY WITH ANESTHESIA N/A 01/06/2020   Procedure: IR WITH ANESTHESIA FOR ANEURYSM;  Surgeon: Consuella Lose, MD;  Location: [place];  Service: Radiology;  Laterality: N/A;  . TUBAL LIGATION      There were no vitals filed for this visit.       [facility] OT Assessment - 06/07/20 0001      Coordination   Left 9 Hole Peg Test 36 sec.      Hand Function   Left Hand Grip (lbs) 16    Left Hand Lateral Pinch 5 lbs    Left 3 point pinch 7 lbs          Pt. Reports that she is planning to have surgery for other aneurysm between March, and April at [facility].. Measurements were obtained, and goals were reviewed with the patient. Pt. Has made progress with left UE strength, grip strength, and [place] skills. Pt. Is improving with RUE ROM, and digit AROM. Pt. Is starting to use her right hand as a gross assist stabilizing items while using her left hand. Pt. Is able to perform hand to face patterns with her right hand, however requires cues, and encouragement to try  to use it to feed herself finger foods.  Pt. Presents with less pain during ROM over this past this past recertification period. Pt. Has progressed with self-dressing, however continues to require assist from her husband. Pt. Continues to present with limited RUE ROM, and limited AROM in the digits of her right hand with increased limitations in the 4th, and 5th digits. Pt. Continues to need OT services to work on improving pain,a nd increasing ROM in the RUE, and hand in order to improve engagement of the RUE during basic ADL, and IADL tasks.                     OT Short Term Goals - 06/07/20 1040      OT SHORT TERM GOAL #3   Title Pt will donn shirt and pants with min A    Status Achieved      OT SHORT TERM GOAL #4   Title Pt will demonstrate 30 A/ROM shoulder flexion for RUE with pain  less than or equal to 4/10.    Baseline Pt. presents with limited isolated  right shoulder flexion with pain between now at a 2    Time 4    Period Weeks    Status Partially Met    Target Date 07/02/20      OT SHORT TERM GOAL #6   Title Pt/ caregiver will verbalize understanding of RUE positioning and edema control techniques to minimize pain and risk for injury    Baseline Pt. uses a pillow, and has a resting hand slpint. Pt. reports being able to perfrom retrograde massage when needed.    Time --    Period --    Status Achieved    Target Date --      OT SHORT TERM GOAL #7   Title Pt will demonstrate at least 25% finger flexion/ extension in RUE in prep for functional use.    Baseline Limited PROM, and active digit flexion, and extension. Improved AROM for right 2nd, and 3rd digits. significant limitations with the 4th, and 5th digits.    Time 4    Period Weeks    Status Partially Met    Target Date 07/02/20             OT Long Term Goals - 06/07/20 1049      OT LONG TERM GOAL #1   Title Pt will perform  all basic ADLs with supervision    Baseline Pt. requires minA from her husband    Time 12    Period Weeks    Status On-going    Target Date 08/30/20      OT LONG TERM GOAL #2   Title Pt will demonstrate 45* RUE shoulder flexion in prep for functional reach with pain less than or equal to 3/10.    Baseline Pt. presents with limited AROM for isolated  right shoulder flexion with pain 2.    Time 12    Period Weeks    Status Partially Met    Target Date 08/30/20      OT LONG TERM GOAL #3   Title Pt will use RUE as a stabilizer/ gross A at least 30 % of the time for ADLs/ IADLs.    Baseline Pt. is using her right hand as a gross stabilizer 10% of the time.    Time 12    Period Weeks    Status On-going    Target Date 08/30/20  OT LONG TERM GOAL #4   Title Pt will increase LUE grip strength to 25 lbs or greater for increased ease with ADLs.    Baseline Pt. has  improved with left grip strength. Pt.continues to work towards 25#.    Time 12    Period Weeks    Status On-going    Target Date 08/30/20      OT LONG TERM GOAL #5   Title Pt will perform simple home management/ snack prep with min A.    Baseline Pt. tries to help when she can, however her husband mostly perfroms these tasks.    Time 12    Period Weeks    Status On-going    Target Date 08/30/20      Long Term Additional Goals   Additional Long Term Goals Yes      OT LONG TERM GOAL #6   Title Pt will demonstrate ability to grasp/ release a cup 2/3 trials with RUE.    Baseline Pt. is unable to grasp/release a cup.    Time 12    Period Weeks    Status On-going    Target Date 08/30/20      OT LONG TERM GOAL #7   Title Pt will safely navigate a busy environment and locate items with supervision and  90% or greater accuracy without bumping into items.    Baseline Pt. is able to navigate independently    Status Achieved      OT LONG TERM GOAL #8   Title I with updated HEP.    Baseline Pt. requires assist, and cuing for HEP.    Time 12    Period Weeks    Status On-going    Target Date 08/30/20      OT LONG TERM GOAL  #9   TITLE Pt. will use her right hand to assist the left hand in searching for items in, and holding her purse.    Baseline Pt. is unable to perform    Time 12    Period Weeks    Status New    Target Date 08/30/20      OT LONG TERM GOAL  #10   TITLE Pt. will use her right hand to assist the left to independently place eye glasses into a case.    Baseline Pt. is unable to perfrom.    Time 12    Period Weeks    Status New    Target Date 08/30/20                 Plan - 06/07/20 1226    Clinical Impression Statement Pt. Reports that she is planning to have surgery for other aneurysm between March, and April at [facility]. Measurements were obtained, and goals were reviewed with the patient. Pt. Has made progress with left UE strength, grip strength, and  [place] skills. Pt. Is improving with RUE ROM, and digit AROM. Pt. Is starting to use her right hand as a gross assist stabilizing items while using her left hand. Pt. Is able to perform hand to face patterns with her right hand, however requires cues, and encouragement to try to use it to feed herself finger foods.  Pt. Presents with less pain during ROM over this past this past recertification period. Pt. Has progressed with self-dressing, however continues to require assist from her husband. Pt. Continues to present with limited RUE ROM, and limited AROM in the digits of her right hand with increased limitations in the 4th,  and 5th digits. Pt. Continues to need OT services to work on improving pain,a nd increasing ROM in the RUE, and hand in order to improve engagement of the RUE during basic ADL, and IADL tasks.       OT Occupational Profile and History Detailed Assessment- Review of Records and additional review of physical, cognitive, psychosocial history related to current functional performance    Occupational performance deficits (Please refer to evaluation for details): ADL's;IADL's;Rest and Sleep;Work;Play;Leisure    Body Structure / Function / Physical Skills ADL;Balance;Coordination;Decreased knowledge of precautions;Decreased knowledge of use of DME;Dexterity;Edema;Mobility;Tone;Strength;IADL;Sensation;GMC;Gait;ROM;FMC;Flexibility;Pain;Vision;UE functional use;Endurance    Cognitive Skills Attention;Perception;Problem Solve;Safety Awareness;Thought    Rehab Potential Good    Clinical Decision Making Several treatment options, min-mod task modification necessary    Comorbidities Affecting Occupational Performance: None    Modification or Assistance to Complete Evaluation  Min-Moderate modification of tasks or assist with assess necessary to complete eval    OT Frequency 3x / week    OT Duration 12 weeks    OT Treatment/Interventions Self-care/ADL training;Ultrasound;Energy  conservation;Visual/perceptual remediation/compensation;Patient/family education;DME and/or AE instruction;Aquatic Therapy;Paraffin;Gait Training;Passive range of motion;Balance training;Fluidtherapy;Electrical Stimulation;Functional Mobility Training;Splinting;Moist Heat;Therapeutic exercise;Manual Therapy;Cognitive remediation/compensation;Manual lymph drainage;Neuromuscular education;Coping strategies training    Consulted and Agree with Plan of Care Patient;Family member/caregiver    Family Member Consulted Spose present during the session.           Patient will benefit from skilled therapeutic intervention in order to improve the following deficits and impairments:   Body Structure / Function / Physical Skills: ADL,Balance,Coordination,Decreased knowledge of precautions,Decreased knowledge of use of DME,Dexterity,Edema,Mobility,Tone,Strength,IADL,Sensation,GMC,Gait,ROM,FMC,Flexibility,Pain,Vision,UE functional use,Endurance Cognitive Skills: Attention,Perception,Problem Solve,Safety Awareness,Thought     Visit Diagnosis: Muscle weakness (generalized)  Other lack of coordination    Problem List Patient Active Problem List   Diagnosis Date Noted  . Dyspareunia in female 04/05/2020  . History of abnormal cervical Pap smear 03/10/2020  . GIB (gastrointestinal bleeding) 02/28/2020  . Elevated BUN   . Prediabetes   . 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[place]) 01/20/2020  . Cerebral vasospasm   . Sinus tachycardia   . Dysphagia, post-stroke   . Thrombocytopenia ([place])   . Acute blood loss anemia   . Brain aneurysm   . ICH (intracerebral hemorrhage) ([place]) 01/05/2020  . History of adenomatous polyp of colon 02/07/2016  . 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 Fissure in ano 11/11/2014  . Hemorrhoids 11/11/2014  . Constipation 11/19/2013  . GERD (gastroesophageal reflux disease) 03/24/2013  . Irritable bowel syndrome with diarrhea 03/24/2013  . Herpes zoster 05/14/2005  .  Abnormal Pap smear of cervix 05/15/1983  . History of cervical dysplasia 05/15/1983    Harrel Carina, MS, OTR/L 06/07/2020, 12:27 PM  [place] MAIN [facility] SERVICES [address] [place], [place], [address] Phone: [phone]   Fax:  [phone]  Name: DARIUS FILLINGIM MRN: [id number] Date of Birth: July 20, 1955

## 2020-06-07 NOTE — Therapy (Signed)
[place] MAIN [facility] SERVICES [address] [place], [place], [address] Phone: [phone]   Fax:  [phone]  Physical Therapy Treatment/DISCHARGE SUMMARY  Patient Details  Name: Erika Stanton MRN: [id number] Date of Birth: 1956/03/31 Referring Provider (PT): Alger Simons, MD   Encounter Date: 06/07/2020   PT End of Session - 06/07/20 1018    Visit Number 26    Number of Visits 29    PT Start Time 0928    PT Stop Time 1010    PT Time Calculation (min) 42 min    Equipment Utilized During Treatment Gait belt    Activity Tolerance Patient tolerated treatment well    Behavior During Therapy [facility] for tasks assessed/performed           Past Medical History:  Diagnosis Date  . Aneurysm ([place])   . Melena     Past Surgical History:  Procedure Laterality Date  . CHOLECYSTECTOMY    . COLONOSCOPY  11/2017   at [facility]. no recurrent polyps.  suggest repeat surveillance study 11/2022.    Marland Kitchen COLONOSCOPY W/ POLYPECTOMY  06/2014   Dr Roxy Manns at [facility].  3 adenomatoous polyps, anal fissure.    . ESOPHAGOGASTRODUODENOSCOPY (EGD) WITH PROPOFOL N/A 01/27/2020   Procedure: ESOPHAGOGASTRODUODENOSCOPY (EGD) WITH PROPOFOL;  Surgeon: Mauri Pole, MD;  Location: [place] [facility];  Service: Endoscopy;  Laterality: N/A;  . IR 3D INDEPENDENT WKST  01/06/2020  . IR ANGIO INTRA EXTRACRAN SEL INTERNAL CAROTID BILAT MOD SED  01/06/2020  . IR ANGIO VERTEBRAL SEL VERTEBRAL UNI L MOD SED  01/06/2020  . IR ANGIOGRAM FOLLOW UP STUDY  01/06/2020  . IR ANGIOGRAM FOLLOW UP STUDY  01/06/2020  . IR ANGIOGRAM FOLLOW UP STUDY  01/06/2020  . IR ANGIOGRAM FOLLOW UP STUDY  01/06/2020  . IR ANGIOGRAM FOLLOW UP STUDY  01/06/2020  . IR ANGIOGRAM FOLLOW UP STUDY  01/06/2020  . IR ANGIOGRAM FOLLOW UP STUDY  01/06/2020  . IR ANGIOGRAM FOLLOW UP STUDY  01/06/2020  . IR ANGIOGRAM FOLLOW UP STUDY  01/06/2020  . IR ANGIOGRAM FOLLOW UP STUDY  01/06/2020  . IR NEURO EACH ADD'L AFTER BASIC UNI  RIGHT (MS)  01/06/2020  . IR TRANSCATH/EMBOLIZ  01/06/2020  . RADIOLOGY WITH ANESTHESIA N/A 01/06/2020   Procedure: IR WITH ANESTHESIA FOR ANEURYSM;  Surgeon: Consuella Lose, MD;  Location: [place];  Service: Radiology;  Laterality: N/A;  . TUBAL LIGATION      There were no vitals filed for this visit.  There Ex:  NuStep x L2 x 5 mins  (Assist to set up). Patient declined any pain or issues with increased resistance today.   Sit to stand from regular height chair without UE support x 10- VC's for technique to scoot to edge of chair and for slow eccentric control.     Neuro re-ed:  Resistive gait using matrix machine 7.5# x 10 feet forward/backward/sidestepping x10 eac-vc for increased step length- patient responsive and improved without episode of loss of balance.   [place] Balance Tux racer- mutiple trials, left UE to no UE support                            PT Short Term Goals - 04/22/20 1957      PT SHORT TERM GOAL #1   Title STGs=LTGs             PT Long Term Goals - 05/31/20 1452  PT LONG TERM GOAL #1   Title Pt will be independent with progressive HEP for balance and strengthening to continue gains on own.    Baseline Pt reports that her PT exercises are going well and doing as instructed. Will continue to progress adding more core stabilization. 1/11: able to do 2-3 times a week;    Time 4    Period Weeks    Status Achieved      PT LONG TERM GOAL #2   Title Pt will increase FGA from 26/30 to 28/30 for improved balance and gait to decrease fall risk.    Baseline 03/23/20 19/30, 04/22/20 26/30, 1/11: 27/30    Time 4    Period Weeks    Status Not Met      PT LONG TERM GOAL #3   Title Pt will perform 5TSS in </=11 secs without UE support in order to indicate improved balance and functional strength    Baseline 16 seconds with L UE support 03/21/20, 04/22/20 13.54 sec without hands from mat, 1/11:17.85 sec with arms across chest from regular  height chair;    Time 4    Period Weeks    Status Not Met      PT LONG TERM GOAL #4   Title Pt will ambulate >1000' on varied surfaces independently with no LOB for improved community ambulation without AD.    Baseline 1050' including >300 feet of grass surface with SBA 03/21/20; we worked on ramp and curb also. 04/22/20 1150' mod I/supervision on varied surfaces inside due to cold., 1/11: independent;    Time 4    Period Weeks    Status Achieved                 Plan - 06/07/20 1018    Clinical Impression Statement The patient tolerated treatment well overall and has made excellent progress towards goals.  The patient's balance was challenged ambulation with EO forward and backward.  Patient is appropriate for discharge today and no longer requires skiled PT services.  The patient is encouraged to continue with her HEP 3-4x/week, update was issued 05/24/20.    Personal Factors and Comorbidities Age;Comorbidity 1;Profession    Comorbidities B ICHs    Examination-Activity Limitations Bend;Carry;Lift;Locomotion Level;Reach Overhead;Squat;Stairs;Stand;Transfers    Examination-Participation Restrictions Cleaning;Community Activity;Driving;Occupation;Yard Work;Laundry;Meal Prep    Stability/Clinical Decision Making Evolving/Moderate complexity    Rehab Potential Good    PT Frequency 1x / week   then 1x/week for 2 weeks   PT Duration 4 weeks    PT Treatment/Interventions ADLs/Self Care Home Management;Aquatic Therapy;Electrical Stimulation;DME Instruction;Gait training;Stair training;Functional mobility training;Therapeutic activities;Therapeutic exercise;Balance training;Neuromuscular re-education;Cognitive remediation;Patient/family education;Orthotic Fit/Training;Manual techniques;Passive range of motion;Dry needling;Energy conservation;Vestibular;Visual/perceptual remediation/compensation    PT Next Visit Plan Continue to work on dynamic standing balance and progressive LE strengthening as  appropriate.    Consulted and Agree with Plan of Care Patient           Patient will benefit from skilled therapeutic intervention in order to improve the following deficits and impairments:  Abnormal gait,Decreased activity tolerance,Decreased balance,Decreased cognition,Decreased endurance,Decreased knowledge of use of DME,Decreased mobility,Decreased strength,Impaired perceived functional ability,Impaired flexibility,Impaired sensation,Postural dysfunction,Improper body mechanics,Impaired vision/preception,Impaired UE functional use,Impaired tone  Visit Diagnosis: Muscle weakness (generalized)  Other lack of coordination  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 Dyspareunia in  female 04/05/2020  . History of abnormal cervical Pap smear 03/10/2020  . GIB (gastrointestinal bleeding) 02/28/2020  . Elevated BUN   . Prediabetes   . 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 Cerebral vasospasm   . Sinus tachycardia   . Dysphagia, post-stroke   . Thrombocytopenia ([place])   . Acute blood loss anemia   . Brain aneurysm   . ICH (intracerebral hemorrhage) ([place]) 01/05/2020  . History of adenomatous polyp of colon 02/07/2016  . 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 Fissure in ano 11/11/2014  . Hemorrhoids 11/11/2014  . Constipation 11/19/2013  . GERD (gastroesophageal reflux disease) 03/24/2013  . Irritable bowel syndrome with diarrhea 03/24/2013  . Herpes zoster 05/14/2005  . Abnormal Pap smear of cervix 05/15/1983  . History of cervical dysplasia 05/15/1983    Hal Morales PT, DPT 06/07/2020, 10:20 AM  [place] MAIN [facility] SERVICES [address] [place], [place], [address] Phone: [phone]   Fax:  [phone]  Name: KEIRSTEN MATUSKA MRN: [id number] Date of Birth: 26-May-1955

## 2020-06-09 ENCOUNTER — Encounter: Payer: Self-pay | Admitting: Occupational Therapy

## 2020-06-09 ENCOUNTER — Ambulatory Visit: Payer: BC Managed Care – PPO | Admitting: Speech Pathology

## 2020-06-09 ENCOUNTER — Ambulatory Visit: Payer: BC Managed Care – PPO | Admitting: Physical Therapy

## 2020-06-09 ENCOUNTER — Other Ambulatory Visit: Payer: Self-pay

## 2020-06-09 ENCOUNTER — Ambulatory Visit: Payer: BC Managed Care – PPO | Admitting: Occupational Therapy

## 2020-06-09 DIAGNOSIS — M6281 Muscle weakness (generalized): Secondary | ICD-10-CM | POA: Diagnosis not present

## 2020-06-09 DIAGNOSIS — R278 Other lack of coordination: Secondary | ICD-10-CM

## 2020-06-09 DIAGNOSIS — R41841 Cognitive communication deficit: Secondary | ICD-10-CM

## 2020-06-09 DIAGNOSIS — I607 Nontraumatic subarachnoid hemorrhage from unspecified intracranial artery: Secondary | ICD-10-CM

## 2020-06-09 NOTE — Therapy (Signed)
[place] MAIN [facility] SERVICES [address] [place], [place], [address] Phone: [phone]   Fax:  [phone]  Speech Language Pathology Treatment  Patient Details  Name: Erika Stanton MRN: [id number] Date of Birth: October 13, 1955 Referring Provider (SLP): Alger Simons, MD   Encounter Date: 06/09/2020   End of Session - 06/09/20 1247    Visit Number 24    Number of Visits 33    Date for SLP Re-Evaluation 07/09/20    Authorization - Visit Number 4    Progress Note Due on Visit 10    SLP Start Time 1100    SLP Stop Time  1150    SLP Time Calculation (min) 50 min    Activity Tolerance Patient tolerated treatment well           Past Medical History:  Diagnosis Date  . Aneurysm ([place])   . Melena     Past Surgical History:  Procedure Laterality Date  . CHOLECYSTECTOMY    . COLONOSCOPY  11/2017   at [facility]. no recurrent polyps.  suggest repeat surveillance study 11/2022.    Marland Kitchen COLONOSCOPY W/ POLYPECTOMY  06/2014   Dr Roxy Manns at [facility].  3 adenomatoous polyps, anal fissure.    . ESOPHAGOGASTRODUODENOSCOPY (EGD) WITH PROPOFOL N/A 01/27/2020   Procedure: ESOPHAGOGASTRODUODENOSCOPY (EGD) WITH PROPOFOL;  Surgeon: Mauri Pole, MD;  Location: [place] [facility];  Service: Endoscopy;  Laterality: N/A;  . IR 3D INDEPENDENT WKST  01/06/2020  . IR ANGIO INTRA EXTRACRAN SEL INTERNAL CAROTID BILAT MOD SED  01/06/2020  . IR ANGIO VERTEBRAL SEL VERTEBRAL UNI L MOD SED  01/06/2020  . IR ANGIOGRAM FOLLOW UP STUDY  01/06/2020  . IR ANGIOGRAM FOLLOW UP STUDY  01/06/2020  . IR ANGIOGRAM FOLLOW UP STUDY  01/06/2020  . IR ANGIOGRAM FOLLOW UP STUDY  01/06/2020  . IR ANGIOGRAM FOLLOW UP STUDY  01/06/2020  . IR ANGIOGRAM FOLLOW UP STUDY  01/06/2020  . IR ANGIOGRAM FOLLOW UP STUDY  01/06/2020  . IR ANGIOGRAM FOLLOW UP STUDY  01/06/2020  . IR ANGIOGRAM FOLLOW UP STUDY  01/06/2020  . IR ANGIOGRAM FOLLOW UP STUDY  01/06/2020  . IR NEURO EACH ADD'L AFTER BASIC UNI RIGHT (MS)   01/06/2020  . IR TRANSCATH/EMBOLIZ  01/06/2020  . RADIOLOGY WITH ANESTHESIA N/A 01/06/2020   Procedure: IR WITH ANESTHESIA FOR ANEURYSM;  Surgeon: Consuella Lose, MD;  Location: [place];  Service: Radiology;  Laterality: N/A;  . TUBAL LIGATION      There were no vitals filed for this visit.   Subjective Assessment - 06/09/20 1244    Subjective Pt reports breast biopsy positive for cancer    Currently in Pain? No/denies                 ADULT SLP TREATMENT - 06/09/20 1245      General Information   Behavior/Cognition Alert;Cooperative    HPI Pt presented with headache and lt hemiplegia on 01-05-20. 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 Stent and embolization of rt ICA on 01-06-20. Pt rec'd ST on CIR for cognition and swallowing and was d/c'd on dys III/thin with supervision level cues for swallow precautions. Ethal was d/c'd 02-23-20.      Treatment Provided   Treatment provided Cognitive-Linquistic      Pain Assessment   Pain Assessment  No/denies pain      Cognitive-Linquistic Treatment   Treatment focused on Cognition;Patient/family/caregiver education    Skilled Treatment Patient has appointment with surgeon next week to discuss treatment of breast cancer, just diagnosed. Pt expressed concern and uncertainty over whether this will need to be performed prior to aneurysm repair which is also pending. With min cues pt used her notes app for preplanning for her appointment by listing questions for the surgeon. Noted to re-read her questions and correct errors spontaneously. Targeted problem solving and alternating attention with web search task; patient alternated attention between her web search and searching for reviews, as well as attention to detail when filling in web forms. Generated appropriate solutions to  change her search strategy when results were too narrow with min-mod cues.      Assessment / Recommendations / Plan   Plan Other (Comment)   Will decrease frequency to 1x per week over the next 4 weeks per pt request as pt attempts to resolve insurance issues for ST     Progression Toward Goals   Progression toward goals Progressing toward goals            SLP Education - 06/09/20 1246    Education Details use notes app to preplan for appointments    Person(s) Educated Patient    Methods Explanation    Comprehension Verbalized understanding;Returned demonstration;Need further instruction            SLP Short Term Goals - 06/09/20 1249      SLP SHORT TERM GOAL #1   Title pt to complete cognitive linguistic testing    Period --   or 4 total sessions   Status Achieved      SLP SHORT TERM GOAL #2   Title pt will demo emergent awareness 100% with min complex therapy tasks x3 visits    Baseline 03-30-20;    Status Partially Met      SLP SHORT TERM GOAL #3   Title pt will demo appriopriate aspiration precuations with suggested POs x3 sessions    Status Deferred      SLP SHORT TERM GOAL #4   Title pt will solve household probelms using functional solutions x3 sessions    Baseline 03-30-20; 04-11-20    Status Achieved      SLP SHORT TERM GOAL #5   Title pt will demo selective attention (internal distraction) to a simple functional task for 10 minutes with rare min A back to task in 3 sessions    Baseline 03-28-20    Period Weeks    Status Achieved      SLP SHORT TERM GOAL #6   Title Patient will preplan daily schedule 5/7 days.    Time 4    Period Weeks    Status New            SLP Long Term Goals - 05/24/20 1630      SLP LONG TERM GOAL #1   Title pt will demo anticipatory awareness by double checking all work with a non-verbal cue x3 sessions    Time 8    Period Weeks   or 17 total sessions, for all LTGs   Status Partially Met      SLP LONG TERM GOAL #2    Title pt will follow swallow precautions with POs with modified independence x3 sessions    Status Deferred      SLP LONG TERM GOAL #3   Title pt will demo [facility] skills in solving  min-mod complex problems in WNL amount of time with modified independence (double checking answers , etc)    Time 8    Period Weeks    Status Partially Met      SLP LONG TERM GOAL #4   Title pt will demo simple alternating attention in functional cognitive linguistic tasks in 3 sessions    Time 8    Period Weeks    Status Partially Met      SLP LONG TERM GOAL #5   Title Patient will use schedule to complete 3 pre-planned tasks with min cues from spouse.    Time 8    Period Weeks    Status Partially Met            Plan - 06/09/20 1247    Clinical Impression Statement Erika Stanton continues to complete assigned tasks at home. Improved self-monitoring today in functional tasks; pt reviewed and corrected mistakes in functional tasks. Less impulsive today. Continue skilled ST to maximize independence, cognitive function, and participation in daily activities.    Speech Therapy Frequency 1x /week   decrease requested by pt due to insurance coverage   Duration 8 weeks    Treatment/Interventions Aspiration precaution training;Pharyngeal strengthening exercises;Compensatory techniques;Diet toleration management by SLP;Trials of upgraded texture/liquids;Cueing hierarchy;Cognitive reorganization;Internal/external aids;Patient/family education;SLP instruction and feedback;Functional tasks    Potential to Achieve Goals Good    Consulted and Agree with Plan of Care Patient;Family member/caregiver           Patient will benefit from skilled therapeutic intervention in order to improve the following deficits and impairments:   Cognitive communication deficit  Cerebral aneurysm rupture [facility])    Problem List Patient Active Problem List   Diagnosis Date Noted  . Dyspareunia in female 04/05/2020  . History of abnormal  cervical Pap smear 03/10/2020  . GIB (gastrointestinal bleeding) 02/28/2020  . Elevated BUN   . Prediabetes   . 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[place]) 01/20/2020  . Cerebral vasospasm   . Sinus tachycardia   . Dysphagia, post-stroke   . Thrombocytopenia ([place])   . Acute blood loss anemia   . Brain aneurysm   . ICH (intracerebral hemorrhage) ([place]) 01/05/2020  . History of adenomatous polyp of colon 02/07/2016  . 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 Fissure in ano 11/11/2014  . Hemorrhoids 11/11/2014  . Constipation 11/19/2013  . GERD (gastroesophageal reflux disease) 03/24/2013  . Irritable bowel syndrome with diarrhea 03/24/2013  . Herpes zoster 05/14/2005  . Abnormal Pap smear of cervix 05/15/1983  . History of cervical dysplasia 05/15/1983   [name], [place], [place] E Azelea Seguin 06/09/2020, 1:04 PM  [place] MAIN [facility] SERVICES [address] [place], [place], [address] Phone: [phone]   Fax:  [phone]   Name: Erika Stanton MRN: [id number] Date of Birth: Jul 22, 1955

## 2020-06-09 NOTE — Therapy (Signed)
[place] MAIN [facility] SERVICES [address] [place], [place], [address] Phone: [phone]   Fax:  [phone]  Occupational Therapy Treatment  Patient Details  Name: Erika Stanton MRN: [id number] Date of Birth: 01-15-1956 Referring Provider (OT): Dr. Naaman Plummer   Encounter Date: 06/09/2020   OT End of Session - 06/09/20 1024    Visit Number 31    Number of Visits 60    Date for OT Re-Evaluation 08/30/20    OT Start Time 1015    OT Stop Time 1100    OT Time Calculation (min) 45 min    Activity Tolerance Patient tolerated treatment well    Behavior During Therapy [facility] for tasks assessed/performed           Past Medical History:  Diagnosis Date  . Aneurysm ([place])   . Melena     Past Surgical History:  Procedure Laterality Date  . CHOLECYSTECTOMY    . COLONOSCOPY  11/2017   at [facility]. no recurrent polyps.  suggest repeat surveillance study 11/2022.    Marland Kitchen COLONOSCOPY W/ POLYPECTOMY  06/2014   Dr Roxy Manns at [facility].  3 adenomatoous polyps, anal fissure.    . ESOPHAGOGASTRODUODENOSCOPY (EGD) WITH PROPOFOL N/A 01/27/2020   Procedure: ESOPHAGOGASTRODUODENOSCOPY (EGD) WITH PROPOFOL;  Surgeon: Mauri Pole, MD;  Location: [place] [facility];  Service: Endoscopy;  Laterality: N/A;  . IR 3D INDEPENDENT WKST  01/06/2020  . IR ANGIO INTRA EXTRACRAN SEL INTERNAL CAROTID BILAT MOD SED  01/06/2020  . IR ANGIO VERTEBRAL SEL VERTEBRAL UNI L MOD SED  01/06/2020  . IR ANGIOGRAM FOLLOW UP STUDY  01/06/2020  . IR ANGIOGRAM FOLLOW UP STUDY  01/06/2020  . IR ANGIOGRAM FOLLOW UP STUDY  01/06/2020  . IR ANGIOGRAM FOLLOW UP STUDY  01/06/2020  . IR ANGIOGRAM FOLLOW UP STUDY  01/06/2020  . IR ANGIOGRAM FOLLOW UP STUDY  01/06/2020  . IR ANGIOGRAM FOLLOW UP STUDY  01/06/2020  . IR ANGIOGRAM FOLLOW UP STUDY  01/06/2020  . IR ANGIOGRAM FOLLOW UP STUDY  01/06/2020  . IR ANGIOGRAM FOLLOW UP STUDY  01/06/2020  . IR NEURO EACH ADD'L AFTER BASIC UNI RIGHT (MS)  01/06/2020  . IR  TRANSCATH/EMBOLIZ  01/06/2020  . RADIOLOGY WITH ANESTHESIA N/A 01/06/2020   Procedure: IR WITH ANESTHESIA FOR ANEURYSM;  Surgeon: Consuella Lose, MD;  Location: [place];  Service: Radiology;  Laterality: N/A;  . TUBAL LIGATION      There were no vitals filed for this visit.   OT TREATMENT   Therapeutic Exercise:   Pt. was able to tolerate AAROM for elbow flexion, elbow extension with resistance, AAROM supination with PROM to the end range. PROM for right wrist, digit MP, PIP, and DIP flexion, and extension, AROM digit MP, PIP, and DIP flexion, and extension. Education was provided about opportunities to perform hand to face patterns with the right UE to begin engaging it during self-feeding.Pt. performed AROM for scapular elevation, depression, and abduction.   Neuromuscular re-ed:  Pt. worked on functional reaching for vertical pegs placed at tabletop height. Emphasis was placed on using a lateral pinch to grasp the pegs, and release them into a container. Pt. Required assist, and support proximally.  Self-care:  Pt. worked on performing hand to face patterns using a tissue to wipe her mouth, or forehead after grasping the tissue from the tissue box.  Pt. reports that her breast biopsy came back positive. Pt. has an appointment with the surgeon scheduled Feb. 10th. Pt. reports that  they have some decisions to make about the timing of the cancer surgery/treatments, and the Brain surgery. Pt. reports that she may need to make some adjustments to the schedule. Pt. Continues to work on improving RUE ROM, and functional reaching in preparation for hand to face patterns for self-care tasks,                           OT Education - 06/09/20 1024    Education Details ROM, pain control, strength, finger ROM, self feeding    Person(s) Educated Patient    Methods Explanation;Demonstration;Verbal cues;Handout    Comprehension Verbalized understanding;Returned  demonstration;Verbal cues required            OT Short Term Goals - 06/07/20 1040      OT SHORT TERM GOAL #3   Title Pt will donn shirt and pants with min A    Status Achieved      OT SHORT TERM GOAL #4   Title Pt will demonstrate 30 A/ROM shoulder flexion for RUE with pain less than or equal to 4/10.    Baseline Pt. presents with limited isolated  right shoulder flexion with pain between now at a 2    Time 4    Period Weeks    Status Partially Met    Target Date 07/02/20      OT SHORT TERM GOAL #6   Title Pt/ caregiver will verbalize understanding of RUE positioning and edema control techniques to minimize pain and risk for injury    Baseline Pt. uses a pillow, and has a resting hand slpint. Pt. reports being able to perfrom retrograde massage when needed.    Time --    Period --    Status Achieved    Target Date --      OT SHORT TERM GOAL #7   Title Pt will demonstrate at least 25% finger flexion/ extension in RUE in prep for functional use.    Baseline Limited PROM, and active digit flexion, and extension. Improved AROM for right 2nd, and 3rd digits. significant limitations with the 4th, and 5th digits.    Time 4    Period Weeks    Status Partially Met    Target Date 07/02/20             OT Long Term Goals - 06/07/20 1049      OT LONG TERM GOAL #1   Title Pt will perform  all basic ADLs with supervision    Baseline Pt. requires minA from her husband    Time 12    Period Weeks    Status On-going    Target Date 08/30/20      OT LONG TERM GOAL #2   Title Pt will demonstrate 45* RUE shoulder flexion in prep for functional reach with pain less than or equal to 3/10.    Baseline Pt. presents with limited AROM for isolated  right shoulder flexion with pain 2.    Time 12    Period Weeks    Status Partially Met    Target Date 08/30/20      OT LONG TERM GOAL #3   Title Pt will use RUE as a stabilizer/ gross A at least 30 % of the time for ADLs/ IADLs.    Baseline  Pt. is using her right hand as a gross stabilizer 10% of the time.    Time 12    Period Weeks    Status  On-going    Target Date 08/30/20      OT LONG TERM GOAL #4   Title Pt will increase LUE grip strength to 25 lbs or greater for increased ease with ADLs.    Baseline Pt. has improved with left grip strength. Pt.continues to work towards 25#.    Time 12    Period Weeks    Status On-going    Target Date 08/30/20      OT LONG TERM GOAL #5   Title Pt will perform simple home management/ snack prep with min A.    Baseline Pt. tries to help when she can, however her husband mostly perfroms these tasks.    Time 12    Period Weeks    Status On-going    Target Date 08/30/20      Long Term Additional Goals   Additional Long Term Goals Yes      OT LONG TERM GOAL #6   Title Pt will demonstrate ability to grasp/ release a cup 2/3 trials with RUE.    Baseline Pt. is unable to grasp/release a cup.    Time 12    Period Weeks    Status On-going    Target Date 08/30/20      OT LONG TERM GOAL #7   Title Pt will safely navigate a busy environment and locate items with supervision and  90% or greater accuracy without bumping into items.    Baseline Pt. is able to navigate independently    Status Achieved      OT LONG TERM GOAL #8   Title I with updated HEP.    Baseline Pt. requires assist, and cuing for HEP.    Time 12    Period Weeks    Status On-going    Target Date 08/30/20      OT LONG TERM GOAL  #9   TITLE Pt. will use her right hand to assist the left hand in searching for items in, and holding her purse.    Baseline Pt. is unable to perform    Time 12    Period Weeks    Status New    Target Date 08/30/20      OT LONG TERM GOAL  #10   TITLE Pt. will use her right hand to assist the left to independently place eye glasses into a case.    Baseline Pt. is unable to perfrom.    Time 12    Period Weeks    Status New    Target Date 08/30/20                 Plan -  06/09/20 1026    Clinical Impression Statement Pt. reports that her breast biopsy came back positive. Pt. has an appointment with the surgeon scheduled Feb. 10th. Pt. reports that they have some decisions to make about the timing of the cancer surgery/treatments, and the Brain surgery. Pt. reports that she may need to make some adjustments to the schedule. Pt. Continues to work on improving RUE ROM, and functional reaching in preparation for hand to face patterns for self-care tasks,    OT Occupational Profile and History Detailed Assessment- Review of Records and additional review of physical, cognitive, psychosocial history related to current functional performance    Occupational performance deficits (Please refer to evaluation for details): ADL's;IADL's;Rest and Sleep;Work;Play;Leisure    Body Structure / Function / Physical Skills ADL;Balance;Coordination;Decreased knowledge of precautions;Decreased knowledge of use of DME;Dexterity;Edema;Mobility;Tone;Strength;IADL;Sensation;GMC;Gait;ROM;FMC;Flexibility;Pain;Vision;UE functional use;Endurance  Cognitive Skills Attention;Perception;Problem Solve;Safety Awareness;Thought    Rehab Potential Good    Clinical Decision Making Several treatment options, min-mod task modification necessary    Modification or Assistance to Complete Evaluation  Min-Moderate modification of tasks or assist with assess necessary to complete eval    OT Frequency 3x / week    OT Duration 12 weeks    OT Treatment/Interventions Self-care/ADL training;Ultrasound;Energy conservation;Visual/perceptual remediation/compensation;Patient/family education;DME and/or AE instruction;Aquatic Therapy;Paraffin;Gait Training;Passive range of motion;Balance training;Fluidtherapy;Electrical Stimulation;Functional Mobility Training;Splinting;Moist Heat;Therapeutic exercise;Manual Therapy;Cognitive remediation/compensation;Manual lymph drainage;Neuromuscular education;Coping strategies training     Consulted and Agree with Plan of Care Patient;Family member/caregiver           Patient will benefit from skilled therapeutic intervention in order to improve the following deficits and impairments:   Body Structure / Function / Physical Skills: ADL,Balance,Coordination,Decreased knowledge of precautions,Decreased knowledge of use of DME,Dexterity,Edema,Mobility,Tone,Strength,IADL,Sensation,GMC,Gait,ROM,FMC,Flexibility,Pain,Vision,UE functional use,Endurance Cognitive Skills: Attention,Perception,Problem Solve,Safety Awareness,Thought     Visit Diagnosis: Muscle weakness (generalized)  Other lack of coordination    Problem List Patient Active Problem List   Diagnosis Date Noted  . Dyspareunia in female 04/05/2020  . History of abnormal cervical Pap smear 03/10/2020  . GIB (gastrointestinal bleeding) 02/28/2020  . Elevated BUN   . Prediabetes   . 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[place]) 01/20/2020  . Cerebral vasospasm   . Sinus tachycardia   . Dysphagia, post-stroke   . Thrombocytopenia ([place])   . Acute blood loss anemia   . Brain aneurysm   . ICH (intracerebral hemorrhage) ([place]) 01/05/2020  . History of adenomatous polyp of colon 02/07/2016  . 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 Fissure in ano 11/11/2014  . Hemorrhoids 11/11/2014  . Constipation 11/19/2013  . GERD (gastroesophageal reflux disease) 03/24/2013  . Irritable bowel syndrome with diarrhea 03/24/2013  . Herpes zoster 05/14/2005  . Abnormal Pap smear of cervix 05/15/1983  . History of cervical dysplasia 05/15/1983    Harrel Carina, MS, OTR/L 06/09/2020, 11:14 AM  [place] MAIN [facility] SERVICES [address] [place], [place], [address] Phone: [phone]   Fax:  [phone]  Name: DWAYNA KENTNER MRN: [id number] Date of Birth: 1955-12-27

## 2020-06-10 ENCOUNTER — Ambulatory Visit: Payer: BC Managed Care – PPO | Admitting: Occupational Therapy

## 2020-06-10 ENCOUNTER — Other Ambulatory Visit: Payer: Self-pay

## 2020-06-10 DIAGNOSIS — M6281 Muscle weakness (generalized): Secondary | ICD-10-CM | POA: Diagnosis not present

## 2020-06-10 DIAGNOSIS — R278 Other lack of coordination: Secondary | ICD-10-CM

## 2020-06-10 DIAGNOSIS — I69159 Hemiplegia and hemiparesis following nontraumatic intracerebral hemorrhage affecting unspecified side: Secondary | ICD-10-CM

## 2020-06-10 DIAGNOSIS — I607 Nontraumatic subarachnoid hemorrhage from unspecified intracranial artery: Secondary | ICD-10-CM

## 2020-06-10 NOTE — Therapy (Signed)
[place] MAIN [facility] SERVICES [address] [place], [place], [address] Phone: [phone]   Fax:  [phone]  Occupational Therapy Treatment  Patient Details  Name: Erika Stanton MRN: [id number] Date of Birth: 09/23/1955 Referring Provider (OT): Dr. Naaman Plummer   Encounter Date: 06/10/2020   OT End of Session - 06/11/20 1914    Visit Number 32    Number of Visits 60    Date for OT Re-Evaluation 08/30/20    Authorization Time Period State [employer]    OT Start Time 1100    OT Stop Time 1150    OT Time Calculation (min) 50 min    Activity Tolerance Patient tolerated treatment well    Behavior During Therapy [facility] for tasks assessed/performed           Past Medical History:  Diagnosis Date  . Aneurysm ([place])   . Melena     Past Surgical History:  Procedure Laterality Date  . CHOLECYSTECTOMY    . COLONOSCOPY  11/2017   at [facility]. no recurrent polyps.  suggest repeat surveillance study 11/2022.    Marland Kitchen COLONOSCOPY W/ POLYPECTOMY  06/2014   Dr Roxy Manns at [facility].  3 adenomatoous polyps, anal fissure.    . ESOPHAGOGASTRODUODENOSCOPY (EGD) WITH PROPOFOL N/A 01/27/2020   Procedure: ESOPHAGOGASTRODUODENOSCOPY (EGD) WITH PROPOFOL;  Surgeon: Mauri Pole, MD;  Location: [place] ENDOSCOPY;  Service: Endoscopy;  Laterality: N/A;  . IR 3D INDEPENDENT WKST  01/06/2020  . IR ANGIO INTRA EXTRACRAN SEL INTERNAL CAROTID BILAT MOD SED  01/06/2020  . IR ANGIO VERTEBRAL SEL VERTEBRAL UNI L MOD SED  01/06/2020  . IR ANGIOGRAM FOLLOW UP STUDY  01/06/2020  . IR ANGIOGRAM FOLLOW UP STUDY  01/06/2020  . IR ANGIOGRAM FOLLOW UP STUDY  01/06/2020  . IR ANGIOGRAM FOLLOW UP STUDY  01/06/2020  . IR ANGIOGRAM FOLLOW UP STUDY  01/06/2020  . IR ANGIOGRAM FOLLOW UP STUDY  01/06/2020  . IR ANGIOGRAM FOLLOW UP STUDY  01/06/2020  . IR ANGIOGRAM FOLLOW UP STUDY  01/06/2020  . IR ANGIOGRAM FOLLOW UP STUDY  01/06/2020  . IR ANGIOGRAM FOLLOW UP STUDY  01/06/2020  . IR NEURO EACH ADD'L AFTER BASIC  UNI RIGHT (MS)  01/06/2020  . IR TRANSCATH/EMBOLIZ  01/06/2020  . RADIOLOGY WITH ANESTHESIA N/A 01/06/2020   Procedure: IR WITH ANESTHESIA FOR ANEURYSM;  Surgeon: Consuella Lose, MD;  Location: [place];  Service: Radiology;  Laterality: N/A;  . TUBAL LIGATION      There were no vitals filed for this visit.   Subjective Assessment - 06/10/20 1912    Subjective  Pt reports she is sad, received bad news about her mammogram and biopsy and will have to have a follow up meeting with her doctor soon to decide on the course of treatment and what will come first, either breast surgery or brain surgery.    Pertinent History ICH 01/06/20, Aneurysm. PMH: OA bilateral knees and hands    Patient Stated Goals Pt would like to be as independent as possible.    Currently in Pain? No/denies    Pain Score 0-No pain            Moist Heat to right shoulder, forearm and hand for 5 mins prior to therapeutic exercise this date.    PROM arm for shoulder flexion/extension, ABD, ADD, elbow flexion/extension, forearm pronation, prolonged stretching for finger flexion/extension.   Followed by Sinclair Ship of the right UE for all motions in available ranges.  Continued tightness with  digits for extension and forearm for full pronation.   ADL: Attempting to fold pillow cases unable to perform in sitting but able to try in standing so she could use the tabletop to support the pillowcase, multiple repetitions completed.  Cues for placement of pillowcase in right hand and use of pinch patterns to support case.  Once she was able to demonstrate with greater ease in standing, she was then able to work on the task from a seated position with therapist demonstration, guiding and cues.   Encouraged Using right hand and lateral pinch during task and to work towards increased movement of right shoulder to position pillow cases for folding.  Patient to try at home with pillow cases and towels.    Response to tx: Patient emotionally  labile at times today when talking about her recent news with positive breast biopsy.  Patient acknowledges the need to work more on ROM at home and encouraged her to work towards using the right hand as a gross assist with as many tasks as possible to help with neuro re ed of the arm and to engage it more for active use.  Continue to provide education on ROM to prevent contractures, use of splint at home and moving towards using right UE in functional tasks as an assist.                      OT Education - 06/11/20 1914    Education Details ROM, pain control, strength, finger ROM, self feeding    Person(s) Educated Patient    Methods Explanation;Demonstration;Verbal cues;Handout    Comprehension Verbalized understanding;Returned demonstration;Verbal cues required            OT Short Term Goals - 06/07/20 1040      OT SHORT TERM GOAL #3   Title Pt will donn shirt and pants with min A    Status Achieved      OT SHORT TERM GOAL #4   Title Pt will demonstrate 30 A/ROM shoulder flexion for RUE with pain less than or equal to 4/10.    Baseline Pt. presents with limited isolated  right shoulder flexion with pain between now at a 2    Time 4    Period Weeks    Status Partially Met    Target Date 07/02/20      OT SHORT TERM GOAL #6   Title Pt/ caregiver will verbalize understanding of RUE positioning and edema control techniques to minimize pain and risk for injury    Baseline Pt. uses a pillow, and has a resting hand slpint. Pt. reports being able to perfrom retrograde massage when needed.    Time --    Period --    Status Achieved    Target Date --      OT SHORT TERM GOAL #7   Title Pt will demonstrate at least 25% finger flexion/ extension in RUE in prep for functional use.    Baseline Limited PROM, and active digit flexion, and extension. Improved AROM for right 2nd, and 3rd digits. significant limitations with the 4th, and 5th digits.    Time 4    Period Weeks     Status Partially Met    Target Date 07/02/20             OT Long Term Goals - 06/07/20 1049      OT LONG TERM GOAL #1   Title Pt will perform  all basic ADLs with supervision  Baseline Pt. requires minA from her husband    Time 12    Period Weeks    Status On-going    Target Date 08/30/20      OT LONG TERM GOAL #2   Title Pt will demonstrate 45* RUE shoulder flexion in prep for functional reach with pain less than or equal to 3/10.    Baseline Pt. presents with limited AROM for isolated  right shoulder flexion with pain 2.    Time 12    Period Weeks    Status Partially Met    Target Date 08/30/20      OT LONG TERM GOAL #3   Title Pt will use RUE as a stabilizer/ gross A at least 30 % of the time for ADLs/ IADLs.    Baseline Pt. is using her right hand as a gross stabilizer 10% of the time.    Time 12    Period Weeks    Status On-going    Target Date 08/30/20      OT LONG TERM GOAL #4   Title Pt will increase LUE grip strength to 25 lbs or greater for increased ease with ADLs.    Baseline Pt. has improved with left grip strength. Pt.continues to work towards 25#.    Time 12    Period Weeks    Status On-going    Target Date 08/30/20      OT LONG TERM GOAL #5   Title Pt will perform simple home management/ snack prep with min A.    Baseline Pt. tries to help when she can, however her husband mostly perfroms these tasks.    Time 12    Period Weeks    Status On-going    Target Date 08/30/20      Long Term Additional Goals   Additional Long Term Goals Yes      OT LONG TERM GOAL #6   Title Pt will demonstrate ability to grasp/ release a cup 2/3 trials with RUE.    Baseline Pt. is unable to grasp/release a cup.    Time 12    Period Weeks    Status On-going    Target Date 08/30/20      OT LONG TERM GOAL #7   Title Pt will safely navigate a busy environment and locate items with supervision and  90% or greater accuracy without bumping into items.    Baseline Pt.  is able to navigate independently    Status Achieved      OT LONG TERM GOAL #8   Title I with updated HEP.    Baseline Pt. requires assist, and cuing for HEP.    Time 12    Period Weeks    Status On-going    Target Date 08/30/20      OT LONG TERM GOAL  #9   TITLE Pt. will use her right hand to assist the left hand in searching for items in, and holding her purse.    Baseline Pt. is unable to perform    Time 12    Period Weeks    Status New    Target Date 08/30/20      OT LONG TERM GOAL  #10   TITLE Pt. will use her right hand to assist the left to independently place eye glasses into a case.    Baseline Pt. is unable to perfrom.    Time 12    Period Weeks    Status New    Target Date  08/30/20                 Plan - 06/11/20 1915    Clinical Impression Statement Patient emotionally labile at times today when talking about her recent news with positive breast biopsy.  Patient acknowledges the need to work more on ROM at home and encouraged her to work towards using the right hand as a gross assist with as many tasks as possible to help with neuro re ed of the arm and to engage it more for active use.  Continue to provide education on ROM to prevent contractures, use of splint at home and moving towards using right UE in functional tasks as an assist.    OT Occupational Profile and History Detailed Assessment- Review of Records and additional review of physical, cognitive, psychosocial history related to current functional performance    Occupational performance deficits (Please refer to evaluation for details): ADL's;IADL's;Rest and Sleep;Work;Play;Leisure    Body Structure / Function / Physical Skills ADL;Balance;Coordination;Decreased knowledge of precautions;Decreased knowledge of use of DME;Dexterity;Edema;Mobility;Tone;Strength;IADL;Sensation;GMC;Gait;ROM;FMC;Flexibility;Pain;Vision;UE functional use;Endurance    Cognitive Skills Attention;Perception;Problem Solve;Safety  Awareness;Thought    Rehab Potential Good    Clinical Decision Making Several treatment options, min-mod task modification necessary    Modification or Assistance to Complete Evaluation  Min-Moderate modification of tasks or assist with assess necessary to complete eval    OT Frequency 3x / week    OT Duration 12 weeks    OT Treatment/Interventions Self-care/ADL training;Ultrasound;Energy conservation;Visual/perceptual remediation/compensation;Patient/family education;DME and/or AE instruction;Aquatic Therapy;Paraffin;Gait Training;Passive range of motion;Balance training;Fluidtherapy;Electrical Stimulation;Functional Mobility Training;Splinting;Moist Heat;Therapeutic exercise;Manual Therapy;Cognitive remediation/compensation;Manual lymph drainage;Neuromuscular education;Coping strategies training    Consulted and Agree with Plan of Care Patient;Family member/caregiver           Patient will benefit from skilled therapeutic intervention in order to improve the following deficits and impairments:   Body Structure / Function / Physical Skills: ADL,Balance,Coordination,Decreased knowledge of precautions,Decreased knowledge of use of DME,Dexterity,Edema,Mobility,Tone,Strength,IADL,Sensation,GMC,Gait,ROM,FMC,Flexibility,Pain,Vision,UE functional use,Endurance Cognitive Skills: Attention,Perception,Problem Solve,Safety Awareness,Thought     Visit Diagnosis: Muscle weakness (generalized)  Other lack of coordination  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 Dyspareunia in female 04/05/2020  . History of abnormal cervical Pap smear 03/10/2020  . GIB (gastrointestinal bleeding) 02/28/2020  . Elevated BUN   . Prediabetes   . 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 Cerebral vasospasm   . Sinus tachycardia   . Dysphagia,  post-stroke   . Thrombocytopenia ([place])   . Acute blood loss anemia   . Brain aneurysm   . ICH (intracerebral hemorrhage) ([place]) 01/05/2020  . History of adenomatous polyp of colon 02/07/2016  . 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 Fissure in ano 11/11/2014  . Hemorrhoids 11/11/2014  . Constipation 11/19/2013  . GERD (gastroesophageal reflux disease) 03/24/2013  . Irritable bowel syndrome with diarrhea 03/24/2013  . Herpes zoster 05/14/2005  . Abnormal Pap smear of cervix 05/15/1983  . History of cervical dysplasia 05/15/1983   Achilles Dunk, OTR/L, CLT  Lunette Tapp 06/11/2020, 7:29 PM  [place] MAIN [facility] SERVICES [address] [place], [place], [address] Phone: [phone]   Fax:  [phone]  Name: Erika Stanton MRN: [id number] Date of Birth: August 23, 1955

## 2020-06-13 ENCOUNTER — Ambulatory Visit: Payer: BC Managed Care – PPO | Admitting: Occupational Therapy

## 2020-06-13 ENCOUNTER — Other Ambulatory Visit: Payer: Self-pay

## 2020-06-13 ENCOUNTER — Encounter: Payer: Self-pay | Admitting: Occupational Therapy

## 2020-06-13 DIAGNOSIS — M6281 Muscle weakness (generalized): Secondary | ICD-10-CM | POA: Diagnosis not present

## 2020-06-13 DIAGNOSIS — R278 Other lack of coordination: Secondary | ICD-10-CM

## 2020-06-13 NOTE — Therapy (Signed)
[place] [facility] SERVICES [address] [place], [place], [address] Phone: [phone]   Fax:  [phone]  Occupational Therapy Treatment  Patient Details  Name: Erika Stanton MRN: [id number] Date of Birth: 10/18/1955 Referring Provider (OT): Dr. Naaman Plummer   Encounter Date: 06/13/2020   OT End of Session - 06/13/20 1033    Visit Number 33    Number of Visits 60    Date for OT Re-Evaluation 08/30/20    OT Start Time 1020    OT Stop Time 1100    OT Time Calculation (min) 40 min    Activity Tolerance Patient tolerated treatment well    Behavior During Therapy [facility] for tasks assessed/performed           Past Medical History:  Diagnosis Date  . Aneurysm ([place])   . Melena     Past Surgical History:  Procedure Laterality Date  . CHOLECYSTECTOMY    . COLONOSCOPY  11/2017   at [facility]. no recurrent polyps.  suggest repeat surveillance study 11/2022.    Marland Kitchen COLONOSCOPY W/ POLYPECTOMY  06/2014   Dr Roxy Manns at [facility].  3 adenomatoous polyps, anal fissure.    . ESOPHAGOGASTRODUODENOSCOPY (EGD) WITH PROPOFOL N/A 01/27/2020   Procedure: ESOPHAGOGASTRODUODENOSCOPY (EGD) WITH PROPOFOL;  Surgeon: Mauri Pole, MD;  Location: [facility];  Service: Endoscopy;  Laterality: N/A;  . IR 3D INDEPENDENT WKST  01/06/2020  . IR ANGIO INTRA EXTRACRAN SEL INTERNAL CAROTID BILAT MOD SED  01/06/2020  . IR ANGIO VERTEBRAL SEL VERTEBRAL UNI L MOD SED  01/06/2020  . IR ANGIOGRAM FOLLOW UP STUDY  01/06/2020  . IR ANGIOGRAM FOLLOW UP STUDY  01/06/2020  . IR ANGIOGRAM FOLLOW UP STUDY  01/06/2020  . IR ANGIOGRAM FOLLOW UP STUDY  01/06/2020  . IR ANGIOGRAM FOLLOW UP STUDY  01/06/2020  . IR ANGIOGRAM FOLLOW UP STUDY  01/06/2020  . IR ANGIOGRAM FOLLOW UP STUDY  01/06/2020  . IR ANGIOGRAM FOLLOW UP STUDY  01/06/2020  . IR ANGIOGRAM FOLLOW UP STUDY  01/06/2020  . IR ANGIOGRAM FOLLOW UP STUDY  01/06/2020  . IR NEURO EACH ADD'L AFTER BASIC UNI RIGHT (MS)  01/06/2020  . IR  TRANSCATH/EMBOLIZ  01/06/2020  . RADIOLOGY WITH ANESTHESIA N/A 01/06/2020   Procedure: IR WITH ANESTHESIA FOR ANEURYSM;  Surgeon: Consuella Lose, MD;  Location: [place];  Service: Radiology;  Laterality: N/A;  . TUBAL LIGATION      There were no vitals filed for this visit.   Subjective Assessment - 06/13/20 1030    Subjective  Pt. reports that she goes to see the breast surgeon on 2/10.    Pertinent History ICH 01/06/20, Aneurysm. PMH: OA bilateral knees and hands    Currently in Pain? Yes    Pain Score 4     Pain Location Hand    Pain Orientation Right    Pain Descriptors / Indicators Aching    Pain Score 3    Pain Location Shoulder    Pain Orientation Right    Pain Descriptors / Indicators Aching          OT TREATMENT   Therapeutic Exercise:  Pt. was able to tolerate AAROM for elbow flexion, elbow extension with resistance, AAROM supination with PROM. PROM for right wrist, digit MP, PIP, and DIP flexion, and extension, AROM digit MP, PIP, and DIP flexion, and extension in preparation for grasping objects.Education was provided about opportunities to perform hand to face patterns with the right UE to begin  engaging it during self-feeding.  Neuromuscular re-ed:  Pt. worked on functional reaching for vertical pegs placed at tabletop height. Emphasis was placed on using a lateral pinch to grasp the pegs, and release them into a container. Pt. Required assist, and support proximally.  Pt. reports that she has an appointment with the breat surgeon scheduled Feb. 10th. Pt. reports that she may need to make some adjustments to the schedule.  Pt.reports that she is trying to use her hand at home for self-feeding tasks, however, and not been able to all that much. Pt. Reports that they have increased the amount of times she is doing the exercises at home with her husband. Pt. continues to work on improving RUE ROM, and functional reaching in preparation for hand to face patterns  for self-care tasks.                          OT Education - 06/13/20 1033    Education Details ROM, pain control, strength, finger ROM, self feeding    Person(s) Educated Patient    Methods Explanation;Demonstration;Verbal cues;Handout    Comprehension Verbalized understanding;Returned demonstration;Verbal cues required            OT Short Term Goals - 06/07/20 1040      OT SHORT TERM GOAL #3   Title Pt will donn shirt and pants with min A    Status Achieved      OT SHORT TERM GOAL #4   Title Pt will demonstrate 30 A/ROM shoulder flexion for RUE with pain less than or equal to 4/10.    Baseline Pt. presents with limited isolated  right shoulder flexion with pain between now at a 2    Time 4    Period Weeks    Status Partially Met    Target Date 07/02/20      OT SHORT TERM GOAL #6   Title Pt/ caregiver will verbalize understanding of RUE positioning and edema control techniques to minimize pain and risk for injury    Baseline Pt. uses a pillow, and has a resting hand slpint. Pt. reports being able to perfrom retrograde massage when needed.    Time --    Period --    Status Achieved    Target Date --      OT SHORT TERM GOAL #7   Title Pt will demonstrate at least 25% finger flexion/ extension in RUE in prep for functional use.    Baseline Limited PROM, and active digit flexion, and extension. Improved AROM for right 2nd, and 3rd digits. significant limitations with the 4th, and 5th digits.    Time 4    Period Weeks    Status Partially Met    Target Date 07/02/20             OT Long Term Goals - 06/07/20 1049      OT LONG TERM GOAL #1   Title Pt will perform  all basic ADLs with supervision    Baseline Pt. requires minA from her husband    Time 12    Period Weeks    Status On-going    Target Date 08/30/20      OT LONG TERM GOAL #2   Title Pt will demonstrate 45* RUE shoulder flexion in prep for functional reach with pain less than or  equal to 3/10.    Baseline Pt. presents with limited AROM for isolated  right shoulder flexion with pain 2.  Time 12    Period Weeks    Status Partially Met    Target Date 08/30/20      OT LONG TERM GOAL #3   Title Pt will use RUE as a stabilizer/ gross A at least 30 % of the time for ADLs/ IADLs.    Baseline Pt. is using her right hand as a gross stabilizer 10% of the time.    Time 12    Period Weeks    Status On-going    Target Date 08/30/20      OT LONG TERM GOAL #4   Title Pt will increase LUE grip strength to 25 lbs or greater for increased ease with ADLs.    Baseline Pt. has improved with left grip strength. Pt.continues to work towards 25#.    Time 12    Period Weeks    Status On-going    Target Date 08/30/20      OT LONG TERM GOAL #5   Title Pt will perform simple home management/ snack prep with min A.    Baseline Pt. tries to help when she can, however her husband mostly perfroms these tasks.    Time 12    Period Weeks    Status On-going    Target Date 08/30/20      Long Term Additional Goals   Additional Long Term Goals Yes      OT LONG TERM GOAL #6   Title Pt will demonstrate ability to grasp/ release a cup 2/3 trials with RUE.    Baseline Pt. is unable to grasp/release a cup.    Time 12    Period Weeks    Status On-going    Target Date 08/30/20      OT LONG TERM GOAL #7   Title Pt will safely navigate a busy environment and locate items with supervision and  90% or greater accuracy without bumping into items.    Baseline Pt. is able to navigate independently    Status Achieved      OT LONG TERM GOAL #8   Title I with updated HEP.    Baseline Pt. requires assist, and cuing for HEP.    Time 12    Period Weeks    Status On-going    Target Date 08/30/20      OT LONG TERM GOAL  #9   TITLE Pt. will use her right hand to assist the left hand in searching for items in, and holding her purse.    Baseline Pt. is unable to perform    Time 12    Period  Weeks    Status New    Target Date 08/30/20      OT LONG TERM GOAL  #10   TITLE Pt. will use her right hand to assist the left to independently place eye glasses into a case.    Baseline Pt. is unable to perfrom.    Time 12    Period Weeks    Status New    Target Date 08/30/20                 Plan - 06/13/20 1034    Clinical Impression Statement Pt. reports that she has an appointment with the breat surgeon scheduled Feb. 10th. Pt. reports that she may need to make some adjustments to the schedule.  Pt.reports that she is trying to use her hand at home for self-feeding tasks, however, and not been able to all that much. Pt. Reports that they   have increased the amount of times she is doing the exercises at home with her husband. Pt. continues to work on improving RUE ROM, and functional reaching in preparation for hand to face patterns for self-care tasks.   OT Occupational Profile and History Detailed Assessment- Review of Records and additional review of physical, cognitive, psychosocial history related to current functional performance    Occupational performance deficits (Please refer to evaluation for details): ADL's;IADL's;Rest and Sleep;Work;Play;Leisure    Body Structure / Function / Physical Skills ADL;Balance;Coordination;Decreased knowledge of precautions;Decreased knowledge of use of DME;Dexterity;Edema;Mobility;Tone;Strength;IADL;Sensation;GMC;Gait;ROM;FMC;Flexibility;Pain;Vision;UE functional use;Endurance    Cognitive Skills Attention;Perception;Problem Solve;Safety Awareness;Thought    Rehab Potential Good    Clinical Decision Making Several treatment options, min-mod task modification necessary    Comorbidities Affecting Occupational Performance: None    Modification or Assistance to Complete Evaluation  Min-Moderate modification of tasks or assist with assess necessary to complete eval    OT Frequency 3x / week    OT Duration 12 weeks    OT Treatment/Interventions  Self-care/ADL training;Ultrasound;Energy conservation;Visual/perceptual remediation/compensation;Patient/family education;DME and/or AE instruction;Aquatic Therapy;Paraffin;Gait Training;Passive range of motion;Balance training;Fluidtherapy;Electrical Stimulation;Functional Mobility Training;Splinting;Moist Heat;Therapeutic exercise;Manual Therapy;Cognitive remediation/compensation;Manual lymph drainage;Neuromuscular education;Coping strategies training    Plan Pt is transferring her care to [facility] as it is closer to home. She remains very fearful of moving RUE and responds well to heat, and estim.    Consulted and Agree with Plan of Care Patient;Family member/caregiver           Patient will benefit from skilled therapeutic intervention in order to improve the following deficits and impairments:   Body Structure / Function / Physical Skills: ADL,Balance,Coordination,Decreased knowledge of precautions,Decreased knowledge of use of DME,Dexterity,Edema,Mobility,Tone,Strength,IADL,Sensation,GMC,Gait,ROM,FMC,Flexibility,Pain,Vision,UE functional use,Endurance Cognitive Skills: Attention,Perception,Problem Solve,Safety Awareness,Thought     Visit Diagnosis: Muscle weakness (generalized)  Other lack of coordination    Problem List Patient Active Problem List   Diagnosis Date Noted  . Dyspareunia in female 04/05/2020  . History of abnormal cervical Pap smear 03/10/2020  . GIB (gastrointestinal bleeding) 02/28/2020  . Elevated BUN   . Prediabetes   . 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 Cerebral vasospasm   . Sinus tachycardia   . Dysphagia, post-stroke   . Thrombocytopenia (HCC)   . Acute blood loss anemia   . Brain aneurysm   . ICH (intracerebral hemorrhage) (HCC) 01/05/2020  . History of adenomatous polyp of colon 02/07/2016  . 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 Fissure in ano 11/11/2014  . Hemorrhoids 11/11/2014  . Constipation 11/19/2013  . GERD  (gastroesophageal reflux disease) 03/24/2013  . Irritable bowel syndrome with diarrhea 03/24/2013  . Herpes zoster 05/14/2005  . Abnormal Pap smear of cervix 05/15/1983  . History of cervical dysplasia 05/15/1983    Elaine Jagentenfl, MS, OTR/L 06/13/2020, 1:49 PM  [facility] [address] [place], [place], [address] Phone: [phone]   Fax:  [phone]  Name: Copeland B Shull MRN: [id number] Date of Birth: 12/08/1955 

## 2020-06-14 ENCOUNTER — Other Ambulatory Visit: Payer: Self-pay

## 2020-06-14 ENCOUNTER — Ambulatory Visit: Payer: BC Managed Care – PPO | Admitting: Occupational Therapy

## 2020-06-14 ENCOUNTER — Ambulatory Visit: Payer: BC Managed Care – PPO

## 2020-06-14 ENCOUNTER — Ambulatory Visit: Payer: BC Managed Care – PPO | Attending: Physical Medicine & Rehabilitation | Admitting: Speech Pathology

## 2020-06-14 ENCOUNTER — Encounter: Payer: Self-pay | Admitting: Occupational Therapy

## 2020-06-14 ENCOUNTER — Ambulatory Visit: Payer: Self-pay

## 2020-06-14 DIAGNOSIS — I607 Nontraumatic subarachnoid hemorrhage from unspecified intracranial artery: Secondary | ICD-10-CM | POA: Diagnosis present

## 2020-06-14 DIAGNOSIS — R41841 Cognitive communication deficit: Secondary | ICD-10-CM | POA: Diagnosis present

## 2020-06-14 DIAGNOSIS — R278 Other lack of coordination: Secondary | ICD-10-CM | POA: Diagnosis present

## 2020-06-14 DIAGNOSIS — M6281 Muscle weakness (generalized): Secondary | ICD-10-CM | POA: Diagnosis present

## 2020-06-14 DIAGNOSIS — I69159 Hemiplegia and hemiparesis following nontraumatic intracerebral hemorrhage affecting unspecified side: Secondary | ICD-10-CM

## 2020-06-15 NOTE — Therapy (Signed)
[place] MAIN [facility] SERVICES [address] [place], [place], [address] Phone: [phone]   Fax:  [phone]  Occupational Therapy Treatment  Patient Details  Name: Erika Stanton MRN: [id number] Date of Birth: [birth year] Referring Provider (OT): Dr. Naaman Plummer   Encounter Date: 06/14/2020   OT End of Session - 06/14/20 1415    Visit Number 34    Number of Visits 60    Date for OT Re-Evaluation 08/30/20    Authorization Time Period State BCBS    OT Start Time 1400    OT Stop Time 1500    OT Time Calculation (min) 60 min    Activity Tolerance Patient tolerated treatment well    Behavior During Therapy [facility] for tasks assessed/performed           Past Medical History:  Diagnosis Date  . Aneurysm ([place])   . Melena     Past Surgical History:  Procedure Laterality Date  . CHOLECYSTECTOMY    . COLONOSCOPY  11/2017   at [facility]. no recurrent polyps.  suggest repeat surveillance study 11/2022.    Marland Kitchen COLONOSCOPY W/ POLYPECTOMY  06/2014   Dr Roxy Manns at [facility].  3 adenomatoous polyps, anal fissure.    . ESOPHAGOGASTRODUODENOSCOPY (EGD) WITH PROPOFOL N/A 01/27/2020   Procedure: ESOPHAGOGASTRODUODENOSCOPY (EGD) WITH PROPOFOL;  Surgeon: Mauri Pole, MD;  Location: [place] ENDOSCOPY;  Service: Endoscopy;  Laterality: N/A;  . IR 3D INDEPENDENT WKST  01/06/2020  . IR ANGIO INTRA EXTRACRAN SEL INTERNAL CAROTID BILAT MOD SED  01/06/2020  . IR ANGIO VERTEBRAL SEL VERTEBRAL UNI L MOD SED  01/06/2020  . IR ANGIOGRAM FOLLOW UP STUDY  01/06/2020  . IR ANGIOGRAM FOLLOW UP STUDY  01/06/2020  . IR ANGIOGRAM FOLLOW UP STUDY  01/06/2020  . IR ANGIOGRAM FOLLOW UP STUDY  01/06/2020  . IR ANGIOGRAM FOLLOW UP STUDY  01/06/2020  . IR ANGIOGRAM FOLLOW UP STUDY  01/06/2020  . IR ANGIOGRAM FOLLOW UP STUDY  01/06/2020  . IR ANGIOGRAM FOLLOW UP STUDY  01/06/2020  . IR ANGIOGRAM FOLLOW UP STUDY  01/06/2020  . IR ANGIOGRAM FOLLOW UP STUDY  01/06/2020  . IR NEURO EACH ADD'L AFTER BASIC  UNI RIGHT (MS)  01/06/2020  . IR TRANSCATH/EMBOLIZ  01/06/2020  . RADIOLOGY WITH ANESTHESIA N/A 01/06/2020   Procedure: IR WITH ANESTHESIA FOR ANEURYSM;  Surgeon: Consuella Lose, MD;  Location: [place];  Service: Radiology;  Laterality: N/A;  . TUBAL LIGATION      There were no vitals filed for this visit.   Subjective Assessment - 06/14/20 1413    Subjective  Pt reports nothing new this week, has appt Feb 10th for the breast surgeon to see what her plan of care will be.    Pertinent History ICH 01/06/20, Aneurysm. PMH: OA bilateral knees and hands    Patient Stated Goals Pt would like to be as independent as possible.    Currently in Pain? Yes    Pain Score 1     Pain Location Shoulder    Pain Orientation Right    Multiple Pain Sites No           Moist Heat to right shoulder, forearm and hand for 5 mins prior to therapeutic exercise this date.    PROM arm for shoulder flexion/extension, ABD, ADD, elbow flexion/extension, forearm pronation, prolonged stretching for finger flexion/extension performed in sitting.  Followed by Sinclair Ship of the right UE for all motions in available ranges.  Pt reports, "I don't  know what to do at home other than stretch my fingers".  Reviewed all the past home exercises and had patient recall what exercises she has done in the past and made a list for her to take home as a reminder of all the exercises we have been working on in the clinic and at home.  Pt realized she has been doing more than giving herself credit for.   Cues for increased shoulder flexion with hand to mouth patterns to consistently reach mouth with right hand. Placed drum stick in patients right hand for grasping and then worked towards isolating movement at the wrist to tap the table with ulnar/radial deviation, then tap sideways for supination/pronation.   Performed juxtaciser exercise with holding tool in right hand and attempting to move the washer along the path to the opposite side to  encourage increased wrist movement in all directions but to have a target/end goal which is motivation for pt.   Grasp and release with use of ping pong ball in right hand.  Grasping with forearm supinated, releasing into opposite hand with pronation.   Cues and therapist guiding with all exercises this date.    Response to tx: Patient continuing to progress and attempting to use right hand more in daily activities as an assist and to try to hold, pinch and carry light weight items.  She is working on improving self feeding skills, needs to add more shoulder flexion into task as directed today to get it to her mouth consistently.  Although patient reported she didn't feel she knows what to do at home, when prompted she was able to name at least 10 exercises/tasks she has been instructed on as a part of her home program.  Therapist made a list together with patient this date for future reference for home use.  Continue to work towards goals in plan of care to maximize safety and independence in necessary daily tasks.                      OT Education - 06/14/20 1414    Education Details ROM, pain control, strength, finger ROM, self feeding    Person(s) Educated Patient    Methods Explanation;Demonstration;Verbal cues;Handout    Comprehension Verbalized understanding;Returned demonstration;Verbal cues required            OT Short Term Goals - 06/07/20 1040      OT SHORT TERM GOAL #3   Title Pt will donn shirt and pants with min A    Status Achieved      OT SHORT TERM GOAL #4   Title Pt will demonstrate 30 A/ROM shoulder flexion for RUE with pain less than or equal to 4/10.    Baseline Pt. presents with limited isolated  right shoulder flexion with pain between now at a 2    Time 4    Period Weeks    Status Partially Met    Target Date 07/02/20      OT SHORT TERM GOAL #6   Title Pt/ caregiver will verbalize understanding of RUE positioning and edema control techniques  to minimize pain and risk for injury    Baseline Pt. uses a pillow, and has a resting hand slpint. Pt. reports being able to perfrom retrograde massage when needed.    Time --    Period --    Status Achieved    Target Date --      OT SHORT TERM GOAL #7   Title  Pt will demonstrate at least 25% finger flexion/ extension in RUE in prep for functional use.    Baseline Limited PROM, and active digit flexion, and extension. Improved AROM for right 2nd, and 3rd digits. significant limitations with the 4th, and 5th digits.    Time 4    Period Weeks    Status Partially Met    Target Date 07/02/20             OT Long Term Goals - 06/07/20 1049      OT LONG TERM GOAL #1   Title Pt will perform  all basic ADLs with supervision    Baseline Pt. requires minA from her husband    Time 12    Period Weeks    Status On-going    Target Date 08/30/20      OT LONG TERM GOAL #2   Title Pt will demonstrate 45* RUE shoulder flexion in prep for functional reach with pain less than or equal to 3/10.    Baseline Pt. presents with limited AROM for isolated  right shoulder flexion with pain 2.    Time 12    Period Weeks    Status Partially Met    Target Date 08/30/20      OT LONG TERM GOAL #3   Title Pt will use RUE as a stabilizer/ gross A at least 30 % of the time for ADLs/ IADLs.    Baseline Pt. is using her right hand as a gross stabilizer 10% of the time.    Time 12    Period Weeks    Status On-going    Target Date 08/30/20      OT LONG TERM GOAL #4   Title Pt will increase LUE grip strength to 25 lbs or greater for increased ease with ADLs.    Baseline Pt. has improved with left grip strength. Pt.continues to work towards 25#.    Time 12    Period Weeks    Status On-going    Target Date 08/30/20      OT LONG TERM GOAL #5   Title Pt will perform simple home management/ snack prep with min A.    Baseline Pt. tries to help when she can, however her husband mostly perfroms these tasks.     Time 12    Period Weeks    Status On-going    Target Date 08/30/20      Long Term Additional Goals   Additional Long Term Goals Yes      OT LONG TERM GOAL #6   Title Pt will demonstrate ability to grasp/ release a cup 2/3 trials with RUE.    Baseline Pt. is unable to grasp/release a cup.    Time 12    Period Weeks    Status On-going    Target Date 08/30/20      OT LONG TERM GOAL #7   Title Pt will safely navigate a busy environment and locate items with supervision and  90% or greater accuracy without bumping into items.    Baseline Pt. is able to navigate independently    Status Achieved      OT LONG TERM GOAL #8   Title I with updated HEP.    Baseline Pt. requires assist, and cuing for HEP.    Time 12    Period Weeks    Status On-going    Target Date 08/30/20      OT LONG TERM GOAL  #9   TITLE Pt.  will use her right hand to assist the left hand in searching for items in, and holding her purse.    Baseline Pt. is unable to perform    Time 12    Period Weeks    Status New    Target Date 08/30/20      OT LONG TERM GOAL  #10   TITLE Pt. will use her right hand to assist the left to independently place eye glasses into a case.    Baseline Pt. is unable to perfrom.    Time 12    Period Weeks    Status New    Target Date 08/30/20                 Plan - 06/14/20 1415    Clinical Impression Statement Patient continuing to progress and attempting to use right hand more in daily activities as an assist and to try to hold, pinch and carry light weight items.  She is working on improving self feeding skills, needs to add more shoulder flexion into task as directed today to get it to her mouth consistently.  Although patient reported she didn't feel she knows what to do at home, when prompted she was able to name at least 10 exercises/tasks she has been instructed on as a part of her home program.  Therapist made a list together with patient this date for future  reference for home use.  Continue to work towards goals in plan of care to maximize safety and independence in necessary daily tasks.    OT Occupational Profile and History Detailed Assessment- Review of Records and additional review of physical, cognitive, psychosocial history related to current functional performance    Occupational performance deficits (Please refer to evaluation for details): ADL's;IADL's;Rest and Sleep;Work;Play;Leisure    Body Structure / Function / Physical Skills ADL;Balance;Coordination;Decreased knowledge of precautions;Decreased knowledge of use of DME;Dexterity;Edema;Mobility;Tone;Strength;IADL;Sensation;GMC;Gait;ROM;FMC;Flexibility;Pain;Vision;UE functional use;Endurance    Cognitive Skills Attention;Perception;Problem Solve;Safety Awareness;Thought    Rehab Potential Good    Clinical Decision Making Several treatment options, min-mod task modification necessary    Comorbidities Affecting Occupational Performance: None    Modification or Assistance to Complete Evaluation  Min-Moderate modification of tasks or assist with assess necessary to complete eval    OT Frequency 3x / week    OT Duration 12 weeks    OT Treatment/Interventions Self-care/ADL training;Ultrasound;Energy conservation;Visual/perceptual remediation/compensation;Patient/family education;DME and/or AE instruction;Aquatic Therapy;Paraffin;Gait Training;Passive range of motion;Balance training;Fluidtherapy;Electrical Stimulation;Functional Mobility Training;Splinting;Moist Heat;Therapeutic exercise;Manual Therapy;Cognitive remediation/compensation;Manual lymph drainage;Neuromuscular education;Coping strategies training    Plan Pt is transferring her care to [facility] OP as it is closer to home. She remains very fearful of moving RUE and responds well to heat, and estim.    Consulted and Agree with Plan of Care Patient;Family member/caregiver           Patient will benefit from skilled therapeutic intervention  in order to improve the following deficits and impairments:   Body Structure / Function / Physical Skills: ADL,Balance,Coordination,Decreased knowledge of precautions,Decreased knowledge of use of DME,Dexterity,Edema,Mobility,Tone,Strength,IADL,Sensation,GMC,Gait,ROM,FMC,Flexibility,Pain,Vision,UE functional use,Endurance Cognitive Skills: Attention,Perception,Problem Solve,Safety Awareness,Thought     Visit Diagnosis: Muscle weakness (generalized)  Other lack of coordination  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 Dyspareunia in female 04/05/2020  . History of abnormal cervical Pap smear 03/10/2020  . GIB (gastrointestinal bleeding) 02/28/2020  . Elevated BUN   . Prediabetes   . 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 Cerebral vasospasm   . Sinus tachycardia   . Dysphagia, post-stroke   . Thrombocytopenia ([place])   . Acute blood loss anemia   . Brain aneurysm   . ICH (intracerebral hemorrhage) ([place]) 01/05/2020  . History of adenomatous polyp of colon 02/07/2016  . 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 Fissure in ano 11/11/2014  . Hemorrhoids 11/11/2014  . Constipation 11/19/2013  . GERD (gastroesophageal reflux disease) 03/24/2013  . Irritable bowel syndrome with diarrhea 03/24/2013  . Herpes zoster 05/14/2005  . Abnormal Pap smear of cervix 05/15/1983  . History of cervical dysplasia 05/15/1983   Achilles Dunk, OTR/L, CLT  Romani Wilbon 06/15/2020, 10:55 AM  [place] MAIN [facility] SERVICES [address] [place], [place], [address] Phone: [phone]   Fax:  [phone]  Name: NJERI VICENTE MRN: [id number] Date of Birth: 19-Feb-1956

## 2020-06-15 NOTE — Therapy (Signed)
[place] MAIN [facility] SERVICES [address] [place], [place], [address] Phone: [phone]   Fax:  [phone]  Speech Language Pathology Treatment  Patient Details  Name: Erika Stanton MRN: [id number] Date of Birth: Nov 07, 1955 Referring Provider (SLP): Alger Simons, MD   Encounter Date: 06/14/2020   End of Session - 06/15/20 0855    Visit Number 25    Number of Visits 33    Date for SLP Re-Evaluation 07/09/20    Authorization - Visit Number 5    Progress Note Due on Visit 10    SLP Start Time 1500    SLP Stop Time  1550    SLP Time Calculation (min) 50 min    Activity Tolerance Patient tolerated treatment well           Past Medical History:  Diagnosis Date  . Aneurysm ([place])   . Melena     Past Surgical History:  Procedure Laterality Date  . CHOLECYSTECTOMY    . COLONOSCOPY  11/2017   at [facility]. no recurrent polyps.  suggest repeat surveillance study 11/2022.    Marland Kitchen COLONOSCOPY W/ POLYPECTOMY  06/2014   Dr Roxy Manns at [facility].  3 adenomatoous polyps, anal fissure.    . ESOPHAGOGASTRODUODENOSCOPY (EGD) WITH PROPOFOL N/A 01/27/2020   Procedure: ESOPHAGOGASTRODUODENOSCOPY (EGD) WITH PROPOFOL;  Surgeon: Mauri Pole, MD;  Location: [place] ENDOSCOPY;  Service: Endoscopy;  Laterality: N/A;  . IR 3D INDEPENDENT WKST  01/06/2020  . IR ANGIO INTRA EXTRACRAN SEL INTERNAL CAROTID BILAT MOD SED  01/06/2020  . IR ANGIO VERTEBRAL SEL VERTEBRAL UNI L MOD SED  01/06/2020  . IR ANGIOGRAM FOLLOW UP STUDY  01/06/2020  . IR ANGIOGRAM FOLLOW UP STUDY  01/06/2020  . IR ANGIOGRAM FOLLOW UP STUDY  01/06/2020  . IR ANGIOGRAM FOLLOW UP STUDY  01/06/2020  . IR ANGIOGRAM FOLLOW UP STUDY  01/06/2020  . IR ANGIOGRAM FOLLOW UP STUDY  01/06/2020  . IR ANGIOGRAM FOLLOW UP STUDY  01/06/2020  . IR ANGIOGRAM FOLLOW UP STUDY  01/06/2020  . IR ANGIOGRAM FOLLOW UP STUDY  01/06/2020  . IR ANGIOGRAM FOLLOW UP STUDY  01/06/2020  . IR NEURO EACH ADD'L AFTER BASIC UNI RIGHT (MS)   01/06/2020  . IR TRANSCATH/EMBOLIZ  01/06/2020  . RADIOLOGY WITH ANESTHESIA N/A 01/06/2020   Procedure: IR WITH ANESTHESIA FOR ANEURYSM;  Surgeon: Consuella Lose, MD;  Location: [place];  Service: Radiology;  Laterality: N/A;  . TUBAL LIGATION      There were no vitals filed for this visit.   Subjective Assessment - 06/15/20 0851    Subjective "I'm not doing them enough," re: exercises for her hand    Currently in Pain? Yes    Pain Score 1     Pain Location Shoulder                 ADULT SLP TREATMENT - 06/15/20 0851      General Information   Behavior/Cognition Alert;Cooperative    HPI Pt presented with headache and lt hemiplegia on 01-05-20. 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 Stent and embolization of rt ICA on 01-06-20. Pt rec'd ST on CIR for cognition and swallowing and was d/c'd on dys III/thin with supervision level cues for swallow precautions. Erika Stanton was d/c'd 02-23-20.      Treatment Provided  Treatment provided Cognitive-Linquistic      Cognitive-Linquistic Treatment   Treatment focused on Cognition;Patient/family/caregiver education    Skilled Treatment Targeted verbal problem solving/executive function this session first with functional scenario: identifying opportunities to complete exercises and strategies to remind her to complete them. Pt generated appropriate solution of completing more difficult exercises on non-therapy days, and used her calendar to determine appropriate dates. SLP assisted with writing checklist on her calendar. Pt also identified other reminders to add to her calendar, such as appointments and home tasks such as meal planning. Generated appropriate solutions to min-mod complex financial problems with occasional min cues.      Assessment / Recommendations / Plan   Plan  Continue with current plan of care      Progression Toward Goals   Progression toward goals Progressing toward goals            SLP Education - 06/15/20 0855    Education Details checklists/planning tasks    Person(s) Educated Patient    Methods Explanation    Comprehension Verbalized understanding            SLP Short Term Goals - 06/09/20 1249      SLP SHORT TERM GOAL #1   Title pt to complete cognitive linguistic testing    Period --   or 4 total sessions   Status Achieved      SLP SHORT TERM GOAL #2   Title pt will demo emergent awareness 100% with min complex therapy tasks x3 visits    Baseline 03-30-20;    Status Partially Met      SLP SHORT TERM GOAL #3   Title pt will demo appriopriate aspiration precuations with suggested POs x3 sessions    Status Deferred      SLP SHORT TERM GOAL #4   Title pt will solve household probelms using functional solutions x3 sessions    Baseline 03-30-20; 04-11-20    Status Achieved      SLP SHORT TERM GOAL #5   Title pt will demo selective attention (internal distraction) to a simple functional task for 10 minutes with rare min A back to task in 3 sessions    Baseline 03-28-20    Period Weeks    Status Achieved      SLP SHORT TERM GOAL #6   Title Patient will preplan daily schedule 5/7 days.    Time 4    Period Weeks    Status New            SLP Long Term Goals - 05/24/20 1630      SLP LONG TERM GOAL #1   Title pt will demo anticipatory awareness by double checking all work with a non-verbal cue x3 sessions    Time 8    Period Weeks   or 17 total sessions, for all LTGs   Status Partially Met      SLP LONG TERM GOAL #2   Title pt will follow swallow precautions with POs with modified independence x3 sessions    Status Deferred      SLP LONG TERM GOAL #3   Title pt will demo [facility] skills in solving min-mod complex problems in WNL amount of time with modified independence (double checking answers , etc)    Time 8     Period Weeks    Status Partially Met      SLP LONG TERM GOAL #4   Title pt will demo simple alternating attention in functional cognitive linguistic tasks  in 3 sessions    Time 8    Period Weeks    Status Partially Met      SLP LONG TERM GOAL #5   Title Patient will use schedule to complete 3 pre-planned tasks with min cues from spouse.    Time 8    Period Weeks    Status Partially Met            Plan - 06/15/20 0855    Clinical Impression Statement Erika Stanton continues to complete assigned tasks at home. Generated solutions to functional problems with min cues. Continue skilled ST to maximize independence, cognitive function, and participation in daily activities.    Speech Therapy Frequency 1x /week   decrease requested by pt due to insurance coverage   Duration 8 weeks    Treatment/Interventions Aspiration precaution training;Pharyngeal strengthening exercises;Compensatory techniques;Diet toleration management by SLP;Trials of upgraded texture/liquids;Cueing hierarchy;Cognitive reorganization;Internal/external aids;Patient/family education;SLP instruction and feedback;Functional tasks    Potential to Achieve Goals Good    Consulted and Agree with Plan of Care Patient;Family member/caregiver           Patient will benefit from skilled therapeutic intervention in order to improve the following deficits and impairments:   Cognitive communication deficit  Cerebral aneurysm rupture [facility])    Problem List Patient Active Problem List   Diagnosis Date Noted  . Dyspareunia in female 04/05/2020  . History of abnormal cervical Pap smear 03/10/2020  . GIB (gastrointestinal bleeding) 02/28/2020  . Elevated BUN   . Prediabetes   . 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[place]) 01/20/2020  . Cerebral vasospasm   . Sinus tachycardia   . Dysphagia, post-stroke   . Thrombocytopenia ([place])   . Acute blood loss anemia   . Brain aneurysm   . ICH (intracerebral hemorrhage) ([place]) 01/05/2020  .  History of adenomatous polyp of colon 02/07/2016  . 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 Fissure in ano 11/11/2014  . Hemorrhoids 11/11/2014  . Constipation 11/19/2013  . GERD (gastroesophageal reflux disease) 03/24/2013  . Irritable bowel syndrome with diarrhea 03/24/2013  . Herpes zoster 05/14/2005  . Abnormal Pap smear of cervix 05/15/1983  . History of cervical dysplasia 05/15/1983   [name], [place], [place] E Leisl Spurrier 06/15/2020, 8:57 AM  [place] MAIN [facility] SERVICES [address] [place], [place], [address] Phone: [phone]   Fax:  [phone]   Name: Erika Stanton MRN: [id number] Date of Birth: [birth year]

## 2020-06-16 ENCOUNTER — Encounter: Payer: Self-pay | Admitting: Occupational Therapy

## 2020-06-16 ENCOUNTER — Ambulatory Visit: Payer: Self-pay

## 2020-06-16 ENCOUNTER — Encounter: Payer: Self-pay | Admitting: Speech Pathology

## 2020-06-16 ENCOUNTER — Ambulatory Visit: Payer: BC Managed Care – PPO | Admitting: Occupational Therapy

## 2020-06-16 ENCOUNTER — Other Ambulatory Visit: Payer: Self-pay

## 2020-06-16 DIAGNOSIS — R278 Other lack of coordination: Secondary | ICD-10-CM

## 2020-06-16 DIAGNOSIS — R41841 Cognitive communication deficit: Secondary | ICD-10-CM | POA: Diagnosis not present

## 2020-06-16 DIAGNOSIS — M6281 Muscle weakness (generalized): Secondary | ICD-10-CM

## 2020-06-16 DIAGNOSIS — I69159 Hemiplegia and hemiparesis following nontraumatic intracerebral hemorrhage affecting unspecified side: Secondary | ICD-10-CM

## 2020-06-16 NOTE — Therapy (Signed)
[place] MAIN [facility] SERVICES [address] [place], [place], [address] Phone: [phone]   Fax:  [phone]  Occupational Therapy Treatment  Patient Details  Name: Erika Stanton MRN: [id number] Date of Birth: 02-19-1956 Referring Provider (OT): Dr. Naaman Plummer   Encounter Date: 06/16/2020   OT End of Session - 06/16/20 1352    Visit Number 35    Number of Visits 60    Date for OT Re-Evaluation 08/30/20    Authorization Time Period State BCBS    OT Start Time 0915    OT Stop Time 1000    OT Time Calculation (min) 45 min    Activity Tolerance Patient tolerated treatment well    Behavior During Therapy [facility] for tasks assessed/performed           Past Medical History:  Diagnosis Date  . Aneurysm ([place])   . Melena     Past Surgical History:  Procedure Laterality Date  . CHOLECYSTECTOMY    . COLONOSCOPY  11/2017   at [facility]. no recurrent polyps.  suggest repeat surveillance study 11/2022.    Marland Kitchen COLONOSCOPY W/ POLYPECTOMY  06/2014   Dr Roxy Manns at [facility].  3 adenomatoous polyps, anal fissure.    . ESOPHAGOGASTRODUODENOSCOPY (EGD) WITH PROPOFOL N/A 01/27/2020   Procedure: ESOPHAGOGASTRODUODENOSCOPY (EGD) WITH PROPOFOL;  Surgeon: Mauri Pole, MD;  Location: [place] [facility]COPY;  Service: Endoscopy;  Laterality: N/A;  . IR 3D INDEPENDENT WKST  01/06/2020  . IR ANGIO INTRA EXTRACRAN SEL INTERNAL CAROTID BILAT MOD SED  01/06/2020  . IR ANGIO VERTEBRAL SEL VERTEBRAL UNI L MOD SED  01/06/2020  . IR ANGIOGRAM FOLLOW UP STUDY  01/06/2020  . IR ANGIOGRAM FOLLOW UP STUDY  01/06/2020  . IR ANGIOGRAM FOLLOW UP STUDY  01/06/2020  . IR ANGIOGRAM FOLLOW UP STUDY  01/06/2020  . IR ANGIOGRAM FOLLOW UP STUDY  01/06/2020  . IR ANGIOGRAM FOLLOW UP STUDY  01/06/2020  . IR ANGIOGRAM FOLLOW UP STUDY  01/06/2020  . IR ANGIOGRAM FOLLOW UP STUDY  01/06/2020  . IR ANGIOGRAM FOLLOW UP STUDY  01/06/2020  . IR ANGIOGRAM FOLLOW UP STUDY  01/06/2020  . IR NEURO EACH ADD'L AFTER BASIC  UNI RIGHT (MS)  01/06/2020  . IR TRANSCATH/EMBOLIZ  01/06/2020  . RADIOLOGY WITH ANESTHESIA N/A 01/06/2020   Procedure: IR WITH ANESTHESIA FOR ANEURYSM;  Surgeon: Consuella Lose, MD;  Location: [facility];  Service: Radiology;  Laterality: N/A;  . TUBAL LIGATION      There were no vitals filed for this visit.   Subjective Assessment - 06/16/20 1350    Subjective  Pt reports she still feels like she can't exercise her left fingers as good as the therapist.  Pt teary eyed and emotional at times during the session, worried about the potential for 2 surgeries and upcoming appt regarding new breast CA findings.    Pertinent History ICH 01/06/20, Aneurysm. PMH: OA bilateral knees and hands    Patient Stated Goals Pt would like to be as independent as possible.    Currently in Pain? Yes    Pain Score 2     Pain Location Hand    Pain Orientation Right    Pain Descriptors / Indicators Aching;Tightness    Pain Type Acute pain    Pain Onset More than a month ago    Pain Frequency Intermittent           MoistHeatto right shoulder, forearm and hand for 5 mins prior to therapeutic exercise this  date.   Therapeutic Exercise: PROM armfor shoulder flexion/extension,ABD, ADD, elbow flexion/extension, forearmpronation, prolonged stretching for finger flexion/extension performed in sitting. Followed byAAROMof the right UE for all motions in available ranges. Cues for increased shoulder flexion with hand to mouth patterns to consistently reach mouth with right hand. Pt reports she has been working on the exercises we listed out in the last session.   This date focused on pendulum exercises in standing, clockwise, counterclockwise, front to back and side to side to initiate shoulder movement prior to other exercises.  Performed in standing.  In standing, patient also performing forward flexion reaching on 1/2 wall and then turned facing wall to perform ABD/ADD of arm with pillowcase under right  hand.  Therapist guiding at times, cues for motion and range.  Recommend pt perform in the kitchen at the counter.   Additional focus on ROM of fingers, self directed and therapist directed, cues for hand positioning with left to assist right at small finger and ring finger.   Key pinch with yellow resistance pinch pin, cues to open to hold object in hand and then working on thumb pinch to open pin to place onto dowel.  Pt surprised at the ability to move her thumb and denied any pain with task today.    Response to tx:   Pt.  Continues to be emotionally labile at times with reality of another surgery for an aneurism and new diagnosis of breast CA.  She continues to work towards improving ROM and active movement of her RUE and is becoming more consistent with tasks at home.  She is starting to initiate more exercises and functional use/holding of objects when she can.  Introduced light resistive pinch today at the right thumb with good success.  She admits not feeling confident with the techniques for finger and hand ROM but able to follow directions and cues from therapist and benefits from repeated instruction.  Continue to work towards goals in plan of care to improve right hand function for ADL and IADL tasks.                       OT Education - 06/16/20 1352    Education Details ROM, pain control, strength, finger ROM, functional tasks at home    Person(s) Educated Patient    Methods Explanation;Demonstration;Verbal cues;Handout    Comprehension Verbalized understanding;Returned demonstration;Verbal cues required            OT Short Term Goals - 06/07/20 1040      OT SHORT TERM GOAL #3   Title Pt will donn shirt and pants with min A    Status Achieved      OT SHORT TERM GOAL #4   Title Pt will demonstrate 30 A/ROM shoulder flexion for RUE with pain less than or equal to 4/10.    Baseline Pt. presents with limited isolated  right shoulder flexion with pain between now  at a 2    Time 4    Period Weeks    Status Partially Met    Target Date 07/02/20      OT SHORT TERM GOAL #6   Title Pt/ caregiver will verbalize understanding of RUE positioning and edema control techniques to minimize pain and risk for injury    Baseline Pt. uses a pillow, and has a resting hand slpint. Pt. reports being able to perfrom retrograde massage when needed.    Time --    Period --  Status Achieved    Target Date --      OT SHORT TERM GOAL #7   Title Pt will demonstrate at least 25% finger flexion/ extension in RUE in prep for functional use.    Baseline Limited PROM, and active digit flexion, and extension. Improved AROM for right 2nd, and 3rd digits. significant limitations with the 4th, and 5th digits.    Time 4    Period Weeks    Status Partially Met    Target Date 07/02/20             OT Long Term Goals - 06/07/20 1049      OT LONG TERM GOAL #1   Title Pt will perform  all basic ADLs with supervision    Baseline Pt. requires minA from her husband    Time 12    Period Weeks    Status On-going    Target Date 08/30/20      OT LONG TERM GOAL #2   Title Pt will demonstrate 45* RUE shoulder flexion in prep for functional reach with pain less than or equal to 3/10.    Baseline Pt. presents with limited AROM for isolated  right shoulder flexion with pain 2.    Time 12    Period Weeks    Status Partially Met    Target Date 08/30/20      OT LONG TERM GOAL #3   Title Pt will use RUE as a stabilizer/ gross A at least 30 % of the time for ADLs/ IADLs.    Baseline Pt. is using her right hand as a gross stabilizer 10% of the time.    Time 12    Period Weeks    Status On-going    Target Date 08/30/20      OT LONG TERM GOAL #4   Title Pt will increase LUE grip strength to 25 lbs or greater for increased ease with ADLs.    Baseline Pt. has improved with left grip strength. Pt.continues to work towards 25#.    Time 12    Period Weeks    Status On-going     Target Date 08/30/20      OT LONG TERM GOAL #5   Title Pt will perform simple home management/ snack prep with min A.    Baseline Pt. tries to help when she can, however her husband mostly perfroms these tasks.    Time 12    Period Weeks    Status On-going    Target Date 08/30/20      Long Term Additional Goals   Additional Long Term Goals Yes      OT LONG TERM GOAL #6   Title Pt will demonstrate ability to grasp/ release a cup 2/3 trials with RUE.    Baseline Pt. is unable to grasp/release a cup.    Time 12    Period Weeks    Status On-going    Target Date 08/30/20      OT LONG TERM GOAL #7   Title Pt will safely navigate a busy environment and locate items with supervision and  90% or greater accuracy without bumping into items.    Baseline Pt. is able to navigate independently    Status Achieved      OT LONG TERM GOAL #8   Title I with updated HEP.    Baseline Pt. requires assist, and cuing for HEP.    Time 12    Period Weeks    Status On-going  Target Date 08/30/20      OT LONG TERM GOAL  #9   TITLE Pt. will use her right hand to assist the left hand in searching for items in, and holding her purse.    Baseline Pt. is unable to perform    Time 12    Period Weeks    Status New    Target Date 08/30/20      OT LONG TERM GOAL  #10   TITLE Pt. will use her right hand to assist the left to independently place eye glasses into a case.    Baseline Pt. is unable to perfrom.    Time 12    Period Weeks    Status New    Target Date 08/30/20                 Plan - 06/16/20 1353    Clinical Impression Statement Pt.  Continues to be emotionally labile at times with reality of another surgery for an aneurism and new diagnosis of breast CA.  She continues to work towards improving ROM and active movement of her RUE and is becoming more consistent with tasks at home.  She is starting to initiate more exercises and functional use/holding of objects when she can.   Introduced light resistive pinch today at the right thumb with good success.  She admits not feeling confident with the techniques for finger and hand ROM but able to follow directions and cues from therapist and benefits from repeated instruction.  Continue to work towards goals in plan of care to improve right hand function for ADL and IADL tasks.    OT Occupational Profile and History Detailed Assessment- Review of Records and additional review of physical, cognitive, psychosocial history related to current functional performance    Occupational performance deficits (Please refer to evaluation for details): ADL's;IADL's;Rest and Sleep;Work;Play;Leisure    Body Structure / Function / Physical Skills ADL;Balance;Coordination;Decreased knowledge of precautions;Decreased knowledge of use of DME;Dexterity;Edema;Mobility;Tone;Strength;IADL;Sensation;GMC;Gait;ROM;FMC;Flexibility;Pain;Vision;UE functional use;Endurance    Cognitive Skills Attention;Perception;Problem Solve;Safety Awareness;Thought    Rehab Potential Good    Clinical Decision Making Several treatment options, min-mod task modification necessary    Comorbidities Affecting Occupational Performance: None    Modification or Assistance to Complete Evaluation  Min-Moderate modification of tasks or assist with assess necessary to complete eval    OT Frequency 3x / week    OT Duration 12 weeks    OT Treatment/Interventions Self-care/ADL training;Ultrasound;Energy conservation;Visual/perceptual remediation/compensation;Patient/family education;DME and/or AE instruction;Aquatic Therapy;Paraffin;Gait Training;Passive range of motion;Balance training;Fluidtherapy;Electrical Stimulation;Functional Mobility Training;Splinting;Moist Heat;Therapeutic exercise;Manual Therapy;Cognitive remediation/compensation;Manual lymph drainage;Neuromuscular education;Coping strategies training    Plan Pt is transferring her care to [facility] as it is closer to home. She  remains very fearful of moving RUE and responds well to heat, and estim.    Consulted and Agree with Plan of Care Patient;Family member/caregiver           Patient will benefit from skilled therapeutic intervention in order to improve the following deficits and impairments:   Body Structure / Function / Physical Skills: ADL,Balance,Coordination,Decreased knowledge of precautions,Decreased knowledge of use of DME,Dexterity,Edema,Mobility,Tone,Strength,IADL,Sensation,GMC,Gait,ROM,FMC,Flexibility,Pain,Vision,UE functional use,Endurance Cognitive Skills: Attention,Perception,Problem Solve,Safety Awareness,Thought     Visit Diagnosis: Muscle weakness (generalized)  Other lack of coordination  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 Dyspareunia in female 04/05/2020  . History of abnormal cervical Pap smear 03/10/2020  . GIB (gastrointestinal bleeding)  02/28/2020  . Elevated BUN   . Prediabetes   . 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 Cerebral vasospasm   . Sinus tachycardia   . Dysphagia, post-stroke   . Thrombocytopenia ([place])   . Acute blood loss anemia   . Brain aneurysm   . ICH (intracerebral hemorrhage) ([place]) 01/05/2020  . History of adenomatous polyp of colon 02/07/2016  . 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 Fissure in ano 11/11/2014  . Hemorrhoids 11/11/2014  . Constipation 11/19/2013  . GERD (gastroesophageal reflux disease) 03/24/2013  . Irritable bowel syndrome with diarrhea 03/24/2013  . Herpes zoster 05/14/2005  . Abnormal Pap smear of cervix 05/15/1983  . History of cervical dysplasia 05/15/1983   Achilles Dunk, OTR/L, CLT  Hortencia Martire 06/16/2020, 2:27 PM  [place] MAIN [facility] SERVICES [address] [place], [place], [address] Phone: [phone]   Fax:   [phone]  Name: Erika Stanton MRN: [id number] Date of Birth: 1955/12/22

## 2020-06-17 ENCOUNTER — Other Ambulatory Visit: Payer: Self-pay

## 2020-06-17 ENCOUNTER — Encounter: Payer: Self-pay | Admitting: Physical Medicine and Rehabilitation

## 2020-06-17 ENCOUNTER — Encounter
Payer: BC Managed Care – PPO | Attending: Physical Medicine and Rehabilitation | Admitting: Physical Medicine and Rehabilitation

## 2020-06-17 DIAGNOSIS — I610 Nontraumatic intracerebral hemorrhage in hemisphere, subcortical: Secondary | ICD-10-CM

## 2020-06-17 DIAGNOSIS — I607 Nontraumatic subarachnoid hemorrhage from unspecified intracranial artery: Secondary | ICD-10-CM | POA: Diagnosis not present

## 2020-06-17 NOTE — Progress Notes (Signed)
Subjective:    Patient ID: Erika Stanton, female    DOB: 1956-01-27, 65 y.o.   MRN: [id number]  HPI  Due to national recommendations of social distancing because of COVID 90, an audio/video tele-health visit is felt to be the most appropriate encounter for this patient at this time. See MyChart message from today for the patient's consent to a tele-health encounter with [facility]. This is a follow up tele-visit via phone. The patient is at home. MD is at office.   Erika Stanton is a 65 year old woman who presents for follow up of the following medical conditions:  1) Cerebral aneurysm rupture 2) Right nontraumatic cerebral subcortical hemorrhage -She has been engaging in outpatient therapies with some improvement in her right arm strength, but she is still unable to use her right hand -She has been requested by her employer to submit additional disability paperwork.  -She worked at [employer] in [employer]- she had to keep up with grant money, needs focus, and continues to be unable to type now because of her right hand weakness and pain. -Her husband asks about in-person follow-up for assessment of her progress with outpatient therapies.   3) Right shoulder adhesive capsulitis She has been having right shoulder pain that has been inhibiting her progress. She is currently taking Tylenol twice per day. Current pain is 2/10. She has never tried blue emu oil. She applies voltaren gel to her knee and did not know she could use it in her shoulder well.   4) She also has right basilar arthritis.   5) She has noted issues with focus, patience, attention, vision. Things are blurrier than they were.  Her PT is getting ready to end as she is doing very well with strength in lower extremities. She is doing very well with SLP, reading, memory, comprehension.   Pain Inventory Average Pain 2 Pain Right Now 2 My pain is aching  LOCATION OF PAIN  Shoulder, elbow, wrist,  hand, fingers  BOWEL Number of stools per week: 7 Oral laxative use No  Type of laxative na Enema or suppository use No  History of colostomy No  Incontinent No   BLADDER Normal In and out cath, frequency na Able to self cath No  Bladder incontinence No  Frequent urination No  Leakage with coughing No  Difficulty starting stream No  Incomplete bladder emptying No    Mobility walk without assistance how many minutes can you walk? 5-10 ability to climb steps?  yes do you drive?  no  Function disabled: date disabled 01/05/20 I need assistance with the following:  dressing, bathing, toileting, meal prep, household duties and shopping  Neuro/Psych trouble walking  Prior Studies TC appt  Physicians involved in your care TC appt   Family History  Problem Relation Age of Onset  . Cancer Mother   . Congestive Heart Failure Mother   . Lung cancer Father   . Lung cancer Sister    Social History   Socioeconomic History  . Marital status: Married    Spouse name: Not on file  . Number of children: Not on file  . Years of education: Not on file  . Highest education level: Not on file  Occupational History  . Not on file  Tobacco Use  . Smoking status: Former Research scientist (life sciences)  . Smokeless tobacco: Never Used  Substance and Sexual Activity  . Alcohol use: Not on file  . Drug use: Not on file  .  Sexual activity: Not on file  Other Topics Concern  . Not on file  Social History Narrative  . Not on file   Social Determinants of Health   Financial Resource Strain: Not on file  Food Insecurity: Not on file  Transportation Needs: Not on file  Physical Activity: Not on file  Stress: Not on file  Social Connections: Not on file   Past Surgical History:  Procedure Laterality Date  . CHOLECYSTECTOMY    . COLONOSCOPY  11/2017   at [facility]. no recurrent polyps.  suggest repeat surveillance study 11/2022.    Marland Kitchen COLONOSCOPY W/ POLYPECTOMY  06/2014   Dr Roxy Manns at [facility].  3  adenomatoous polyps, anal fissure.    . ESOPHAGOGASTRODUODENOSCOPY (EGD) WITH PROPOFOL N/A 01/27/2020   Procedure: ESOPHAGOGASTRODUODENOSCOPY (EGD) WITH PROPOFOL;  Surgeon: Mauri Pole, MD;  Location: [place] [facility];  Service: Endoscopy;  Laterality: N/A;  . IR 3D INDEPENDENT WKST  01/06/2020  . IR ANGIO INTRA EXTRACRAN SEL INTERNAL CAROTID BILAT MOD SED  01/06/2020  . IR ANGIO VERTEBRAL SEL VERTEBRAL UNI L MOD SED  01/06/2020  . IR ANGIOGRAM FOLLOW UP STUDY  01/06/2020  . IR ANGIOGRAM FOLLOW UP STUDY  01/06/2020  . IR ANGIOGRAM FOLLOW UP STUDY  01/06/2020  . IR ANGIOGRAM FOLLOW UP STUDY  01/06/2020  . IR ANGIOGRAM FOLLOW UP STUDY  01/06/2020  . IR ANGIOGRAM FOLLOW UP STUDY  01/06/2020  . IR ANGIOGRAM FOLLOW UP STUDY  01/06/2020  . IR ANGIOGRAM FOLLOW UP STUDY  01/06/2020  . IR ANGIOGRAM FOLLOW UP STUDY  01/06/2020  . IR ANGIOGRAM FOLLOW UP STUDY  01/06/2020  . IR NEURO EACH ADD'L AFTER BASIC UNI RIGHT (MS)  01/06/2020  . IR TRANSCATH/EMBOLIZ  01/06/2020  . RADIOLOGY WITH ANESTHESIA N/A 01/06/2020   Procedure: IR WITH ANESTHESIA FOR ANEURYSM;  Surgeon: Consuella Lose, MD;  Location: [place];  Service: Radiology;  Laterality: N/A;  . TUBAL LIGATION     Past Medical History:  Diagnosis Date  . Aneurysm ([place])   . Melena    There were no vitals taken for this visit.  Opioid Risk Score:   Fall Risk Score:  `1  Depression screen PHQ 2/9  Depression screen PHQ 2/9 03/22/2020  Decreased Interest 0  Down, Depressed, Hopeless 1  PHQ - 2 Score 1  Altered sleeping 0  Tired, decreased energy 1  Change in appetite 0  Feeling bad or failure about yourself  0  Trouble concentrating 3  Moving slowly or fidgety/restless 0  Suicidal thoughts 0  PHQ-9 Score 5  Difficult doing work/chores Very difficult      Review of Systems  Musculoskeletal: Positive for gait problem.       Shoulder pain Elbow pain Wrist pain  hand pain Fingers pain   All other systems reviewed and are negative.       Objective:   Physical Exam Not performed as patient was seen via phone visit.     Assessment & Plan:  Erika Stanton is a 65 year old woman who presents for transitional care follow-up after ICH and ruptured cerebral aneurysm.  1) ICH, ruptured cerebral brain aneurysm:  -Has had follow-up with neurology. -Progress with therapy reviewed, continue with outpatient therapy with focus on strengthening right upper extremity. -Discussed current impairments and disability form completed as patient continues to be unable to type with her right hand, which is necessary for her work.   2) Impaired mobility, ADLs, attention -Currently she is walking much better. Still having  difficulty with longer distances (has history of OA and this was an issue prior to her ICH). I have provided her a handicap placard.  -Filled out disability paperwork. Called husband back after appointment to let him know it was ready and he will pick it up tomorrow as he would like to have a copy. 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  -Continue outpatient therapy.   3) Right shoulder pain- primarily over the biceps and deltoid, anteriorly postured -Assess if she requires repeat steroid injection next appointment.  --Discussed [employer] PNS system as an option of pain treatment via neuromodulation. Provided following link for patient to learn more about the system: https://www.sprtherapeutics.com/. She does not prefer this option currently -Apply topical blue emu oil -OTC 4% lidocaine patch -Increase gabapentin to 300mg  TID as this is helping with the pain.   4) Right basilar OA:  -Can perform lidocaine and/or injection next visit -Continue bracing -Can apply blue emu oil here as well.   5) Low K+ -Monitor at follow-up.   6) Prediabetes: -Repeat HgbA1c at follow-up  7) Vaccines: Advised that she is ok to get flu vaccine and COVID booster- 2 weeks apart,  latter one month after 2nd vaccination.   8) Alcohol use: She enjoyed drinking beers- sometimes multiple/day. Advised that this can affect cognition and would recommend no alcohol while she continues to have cognitive deficits.  9) Blood in stool: No longer having, please contact me or PCP if recurs. Has GI f/u.  20 minutes spent in discussion of current limitations and impairments for completion of disability form, discussion of outpatient therapies, review of prior problems and notes

## 2020-06-20 ENCOUNTER — Ambulatory Visit: Payer: Self-pay

## 2020-06-20 ENCOUNTER — Encounter: Payer: Self-pay | Admitting: Speech Pathology

## 2020-06-20 ENCOUNTER — Other Ambulatory Visit: Payer: Self-pay

## 2020-06-20 ENCOUNTER — Encounter: Payer: Self-pay | Admitting: Occupational Therapy

## 2020-06-20 ENCOUNTER — Ambulatory Visit: Payer: BC Managed Care – PPO | Admitting: Occupational Therapy

## 2020-06-20 DIAGNOSIS — R278 Other lack of coordination: Secondary | ICD-10-CM

## 2020-06-20 DIAGNOSIS — M6281 Muscle weakness (generalized): Secondary | ICD-10-CM

## 2020-06-20 DIAGNOSIS — R41841 Cognitive communication deficit: Secondary | ICD-10-CM | POA: Diagnosis not present

## 2020-06-20 NOTE — Therapy (Signed)
[place] MAIN [facility] SERVICES [address] [place], [place], [address] Phone: [phone]   Fax:  [phone]  Occupational Therapy Treatment  Patient Details  Name: Erika Stanton MRN: [id number] Date of Birth: 10-10-1955 Referring Provider (OT): Dr. Naaman Plummer   Encounter Date: 06/20/2020   OT End of Session - 06/20/20 1116    Visit Number 36    Number of Visits 60    Date for OT Re-Evaluation 08/30/20    Authorization Time Period State BCBS    OT Start Time 1104    OT Stop Time 1145    OT Time Calculation (min) 41 min    Activity Tolerance Patient tolerated treatment well    Behavior During Therapy [facility] for tasks assessed/performed           Past Medical History:  Diagnosis Date  . Aneurysm ([place])   . Melena     Past Surgical History:  Procedure Laterality Date  . CHOLECYSTECTOMY    . COLONOSCOPY  11/2017   at [facility]. no recurrent polyps.  suggest repeat surveillance study 11/2022.    Marland Kitchen COLONOSCOPY W/ POLYPECTOMY  06/2014   Dr Roxy Manns at [facility].  3 adenomatoous polyps, anal fissure.    . ESOPHAGOGASTRODUODENOSCOPY (EGD) WITH PROPOFOL N/A 01/27/2020   Procedure: ESOPHAGOGASTRODUODENOSCOPY (EGD) WITH PROPOFOL;  Surgeon: Mauri Pole, MD;  Location: [place] ENDOSCOPY;  Service: Endoscopy;  Laterality: N/A;  . IR 3D INDEPENDENT WKST  01/06/2020  . IR ANGIO INTRA EXTRACRAN SEL INTERNAL CAROTID BILAT MOD SED  01/06/2020  . IR ANGIO VERTEBRAL SEL VERTEBRAL UNI L MOD SED  01/06/2020  . IR ANGIOGRAM FOLLOW UP STUDY  01/06/2020  . IR ANGIOGRAM FOLLOW UP STUDY  01/06/2020  . IR ANGIOGRAM FOLLOW UP STUDY  01/06/2020  . IR ANGIOGRAM FOLLOW UP STUDY  01/06/2020  . IR ANGIOGRAM FOLLOW UP STUDY  01/06/2020  . IR ANGIOGRAM FOLLOW UP STUDY  01/06/2020  . IR ANGIOGRAM FOLLOW UP STUDY  01/06/2020  . IR ANGIOGRAM FOLLOW UP STUDY  01/06/2020  . IR ANGIOGRAM FOLLOW UP STUDY  01/06/2020  . IR ANGIOGRAM FOLLOW UP STUDY  01/06/2020  . IR NEURO EACH ADD'L AFTER BASIC  UNI RIGHT (MS)  01/06/2020  . IR TRANSCATH/EMBOLIZ  01/06/2020  . RADIOLOGY WITH ANESTHESIA N/A 01/06/2020   Procedure: IR WITH ANESTHESIA FOR ANEURYSM;  Surgeon: Consuella Lose, MD;  Location: [place];  Service: Radiology;  Laterality: N/A;  . TUBAL LIGATION      There were no vitals filed for this visit.   Subjective Assessment - 06/20/20 1114    Subjective  Pt. reports that she has an appointment with the breast surgeon.this week.    Pertinent History ICH 01/06/20, Aneurysm. PMH: OA bilateral knees and hands    Currently in Pain? Yes    Pain Score 1     Pain Location Hand    Pain Orientation Right    Pain Descriptors / Indicators Aching;Tightness    Pain Type Acute pain;Chronic pain    Pain Score 1    Pain Location Shoulder    Pain Orientation Right    Pain Descriptors / Indicators Aching          OT TREATMENT   Therapeutic Exercise:  Pt. was able to tolerate AAROM for elbow flexion, elbow extension with resistance, AAROM supination with PROM. PROM for right wrist, digit MP, PIP, and DIP flexion, and extension, AROM digit MP, PIP, and DIP flexion, and extension in preparation for grasping objects.Education  was provided about opportunities to perform hand to face patterns with the right UE to begin engaging it during self-feeding.  Neuromuscular re-ed:  Pt. worked on functional reaching for vertical pegs placed at tabletop height. Emphasis was placed on using a lateral pinch to grasp the pegs, and release them into a container. Pt. required assist, and support proximally.  Pt. reports that she has an appointment with the breat surgeon this week. Pt. reports that she may need to make some adjustments to the schedule. Pt.reports that she continues to use her hand at home for self-feeding tasks, however, and not been able to all that much. Pt. Reports that they have increased the amount of times she is doing the exercises at home with her husband. Pt. continues to work on  improving RUE ROM, and functional reaching in preparation for hand to face patterns for self-care tasks.                        OT Education - 06/20/20 1115    Education Details ROM, pain control, strength, finger ROM, functional tasks at home    Person(s) Educated Patient    Methods Explanation;Demonstration;Verbal cues;Handout    Comprehension Verbalized understanding;Returned demonstration;Verbal cues required            OT Short Term Goals - 06/07/20 1040      OT SHORT TERM GOAL #3   Title Pt will donn shirt and pants with min A    Status Achieved      OT SHORT TERM GOAL #4   Title Pt will demonstrate 30 A/ROM shoulder flexion for RUE with pain less than or equal to 4/10.    Baseline Pt. presents with limited isolated  right shoulder flexion with pain between now at a 2    Time 4    Period Weeks    Status Partially Met    Target Date 07/02/20      OT SHORT TERM GOAL #6   Title Pt/ caregiver will verbalize understanding of RUE positioning and edema control techniques to minimize pain and risk for injury    Baseline Pt. uses a pillow, and has a resting hand slpint. Pt. reports being able to perfrom retrograde massage when needed.    Time --    Period --    Status Achieved    Target Date --      OT SHORT TERM GOAL #7   Title Pt will demonstrate at least 25% finger flexion/ extension in RUE in prep for functional use.    Baseline Limited PROM, and active digit flexion, and extension. Improved AROM for right 2nd, and 3rd digits. significant limitations with the 4th, and 5th digits.    Time 4    Period Weeks    Status Partially Met    Target Date 07/02/20             OT Long Term Goals - 06/07/20 1049      OT LONG TERM GOAL #1   Title Pt will perform  all basic ADLs with supervision    Baseline Pt. requires minA from her husband    Time 12    Period Weeks    Status On-going    Target Date 08/30/20      OT LONG TERM GOAL #2   Title Pt will  demonstrate 45* RUE shoulder flexion in prep for functional reach with pain less than or equal to 3/10.    Baseline Pt. presents  with limited AROM for isolated  right shoulder flexion with pain 2.    Time 12    Period Weeks    Status Partially Met    Target Date 08/30/20      OT LONG TERM GOAL #3   Title Pt will use RUE as a stabilizer/ gross A at least 30 % of the time for ADLs/ IADLs.    Baseline Pt. is using her right hand as a gross stabilizer 10% of the time.    Time 12    Period Weeks    Status On-going    Target Date 08/30/20      OT LONG TERM GOAL #4   Title Pt will increase LUE grip strength to 25 lbs or greater for increased ease with ADLs.    Baseline Pt. has improved with left grip strength. Pt.continues to work towards 25#.    Time 12    Period Weeks    Status On-going    Target Date 08/30/20      OT LONG TERM GOAL #5   Title Pt will perform simple home management/ snack prep with min A.    Baseline Pt. tries to help when she can, however her husband mostly perfroms these tasks.    Time 12    Period Weeks    Status On-going    Target Date 08/30/20      Long Term Additional Goals   Additional Long Term Goals Yes      OT LONG TERM GOAL #6   Title Pt will demonstrate ability to grasp/ release a cup 2/3 trials with RUE.    Baseline Pt. is unable to grasp/release a cup.    Time 12    Period Weeks    Status On-going    Target Date 08/30/20      OT LONG TERM GOAL #7   Title Pt will safely navigate a busy environment and locate items with supervision and  90% or greater accuracy without bumping into items.    Baseline Pt. is able to navigate independently    Status Achieved      OT LONG TERM GOAL #8   Title I with updated HEP.    Baseline Pt. requires assist, and cuing for HEP.    Time 12    Period Weeks    Status On-going    Target Date 08/30/20      OT LONG TERM GOAL  #9   TITLE Pt. will use her right hand to assist the left hand in searching for items  in, and holding her purse.    Baseline Pt. is unable to perform    Time 12    Period Weeks    Status New    Target Date 08/30/20      OT LONG TERM GOAL  #10   TITLE Pt. will use her right hand to assist the left to independently place eye glasses into a case.    Baseline Pt. is unable to perfrom.    Time 12    Period Weeks    Status New    Target Date 08/30/20                 Plan - 06/20/20 1117    Clinical Impression Statement Pt. reports that she has an appointment with the breast surgeon this week. Pt. reports that she may need to make some adjustments to the schedule. Pt.reports that she continues to use her hand at home for self-feeding tasks, however,  and not been able to all that much. Pt. Reports that they have increased the amount of times she is doing the exercises at home with her husband. Pt. continues to work on improving RUE ROM, and functional reaching in preparation for hand to face patterns for self-care tasks.   OT Occupational Profile and History Detailed Assessment- Review of Records and additional review of physical, cognitive, psychosocial history related to current functional performance    Occupational performance deficits (Please refer to evaluation for details): ADL's;IADL's;Rest and Sleep;Work;Play;Leisure    Body Structure / Function / Physical Skills ADL;Balance;Coordination;Decreased knowledge of precautions;Decreased knowledge of use of DME;Dexterity;Edema;Mobility;Tone;Strength;IADL;Sensation;GMC;Gait;ROM;FMC;Flexibility;Pain;Vision;UE functional use;Endurance    Cognitive Skills Attention;Perception;Problem Solve;Safety Awareness;Thought    Rehab Potential Good    Clinical Decision Making Several treatment options, min-mod task modification necessary    Comorbidities Affecting Occupational Performance: None    Modification or Assistance to Complete Evaluation  Min-Moderate modification of tasks or assist with assess necessary to complete eval    OT  Frequency 3x / week    OT Duration 12 weeks    OT Treatment/Interventions Self-care/ADL training;Ultrasound;Energy conservation;Visual/perceptual remediation/compensation;Patient/family education;DME and/or AE instruction;Aquatic Therapy;Paraffin;Gait Training;Passive range of motion;Balance training;Fluidtherapy;Electrical Stimulation;Functional Mobility Training;Splinting;Moist Heat;Therapeutic exercise;Manual Therapy;Cognitive remediation/compensation;Manual lymph drainage;Neuromuscular education;Coping strategies training    Consulted and Agree with Plan of Care Patient;Family member/caregiver           Patient will benefit from skilled therapeutic intervention in order to improve the following deficits and impairments:   Body Structure / Function / Physical Skills: ADL,Balance,Coordination,Decreased knowledge of precautions,Decreased knowledge of use of DME,Dexterity,Edema,Mobility,Tone,Strength,IADL,Sensation,GMC,Gait,ROM,FMC,Flexibility,Pain,Vision,UE functional use,Endurance Cognitive Skills: Attention,Perception,Problem Solve,Safety Awareness,Thought     Visit Diagnosis: Muscle weakness (generalized)  Other lack of coordination    Problem List Patient Active Problem List   Diagnosis Date Noted  . Dyspareunia in female 04/05/2020  . History of abnormal cervical Pap smear 03/10/2020  . GIB (gastrointestinal bleeding) 02/28/2020  . Elevated BUN   . Prediabetes   . 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 Cerebral vasospasm   . Sinus tachycardia   . Dysphagia, post-stroke   . Thrombocytopenia ([place])   . Acute blood loss anemia   . Brain aneurysm   . ICH (intracerebral hemorrhage) ([place]) 01/05/2020  . History of adenomatous polyp of colon 02/07/2016  . 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 Fissure in ano 11/11/2014  . Hemorrhoids 11/11/2014  . Constipation 11/19/2013  . GERD (gastroesophageal reflux disease) 03/24/2013  . Irritable bowel syndrome with  diarrhea 03/24/2013  . Herpes zoster 05/14/2005  . Abnormal Pap smear of cervix 05/15/1983  . History of cervical dysplasia 05/15/1983    Harrel Carina, MS, OTR/L  06/20/2020, 2:01 PM  [place] MAIN [facility] SERVICES [address] [place], [place], [address] Phone: [phone]   Fax:  [phone]  Name: LUCA BURSTON MRN: [id number] Date of Birth: [birth year]

## 2020-06-21 ENCOUNTER — Encounter: Payer: Self-pay | Admitting: Occupational Therapy

## 2020-06-22 ENCOUNTER — Ambulatory Visit: Payer: Self-pay

## 2020-06-22 ENCOUNTER — Ambulatory Visit: Payer: BC Managed Care – PPO | Admitting: Speech Pathology

## 2020-06-22 ENCOUNTER — Encounter: Payer: Self-pay | Admitting: Occupational Therapy

## 2020-06-22 ENCOUNTER — Ambulatory Visit: Payer: BC Managed Care – PPO | Admitting: Occupational Therapy

## 2020-06-22 ENCOUNTER — Other Ambulatory Visit: Payer: Self-pay

## 2020-06-22 DIAGNOSIS — R278 Other lack of coordination: Secondary | ICD-10-CM

## 2020-06-22 DIAGNOSIS — M6281 Muscle weakness (generalized): Secondary | ICD-10-CM

## 2020-06-22 DIAGNOSIS — I69159 Hemiplegia and hemiparesis following nontraumatic intracerebral hemorrhage affecting unspecified side: Secondary | ICD-10-CM

## 2020-06-22 DIAGNOSIS — R41841 Cognitive communication deficit: Secondary | ICD-10-CM

## 2020-06-22 DIAGNOSIS — I607 Nontraumatic subarachnoid hemorrhage from unspecified intracranial artery: Secondary | ICD-10-CM

## 2020-06-22 NOTE — Therapy (Signed)
[place] MAIN [facility] SERVICES [address] [place], [place], [address] Phone: [phone]   Fax:  [phone]  Occupational Therapy Treatment  Patient Details  Name: Erika Stanton MRN: [id number] Date of Birth: 01/25/1956 Referring Provider (OT): Dr. Naaman Plummer   Encounter Date: 06/22/2020   OT End of Session - 06/25/20 2031    Visit Number 37    Number of Visits 60    Date for OT Re-Evaluation 08/30/20    Authorization Time Period State BCBS    OT Start Time 1300    OT Stop Time 1345    OT Time Calculation (min) 45 min    Activity Tolerance Patient tolerated treatment well    Behavior During Therapy [facility] for tasks assessed/performed           Past Medical History:  Diagnosis Date  . Aneurysm ([place])   . Melena     Past Surgical History:  Procedure Laterality Date  . CHOLECYSTECTOMY    . COLONOSCOPY  11/2017   at [facility]. no recurrent polyps.  suggest repeat surveillance study 11/2022.    Marland Kitchen COLONOSCOPY W/ POLYPECTOMY  06/2014   Dr Roxy Manns at [facility]  3 adenomatoous polyps, anal fissure.    . ESOPHAGOGASTRODUODENOSCOPY (EGD) WITH PROPOFOL N/A 01/27/2020   Procedure: ESOPHAGOGASTRODUODENOSCOPY (EGD) WITH PROPOFOL;  Surgeon: Mauri Pole, MD;  Location: [place] ENDOSCOPY;  Service: Endoscopy;  Laterality: N/A;  . IR 3D INDEPENDENT WKST  01/06/2020  . IR ANGIO INTRA EXTRACRAN SEL INTERNAL CAROTID BILAT MOD SED  01/06/2020  . IR ANGIO VERTEBRAL SEL VERTEBRAL UNI L MOD SED  01/06/2020  . IR ANGIOGRAM FOLLOW UP STUDY  01/06/2020  . IR ANGIOGRAM FOLLOW UP STUDY  01/06/2020  . IR ANGIOGRAM FOLLOW UP STUDY  01/06/2020  . IR ANGIOGRAM FOLLOW UP STUDY  01/06/2020  . IR ANGIOGRAM FOLLOW UP STUDY  01/06/2020  . IR ANGIOGRAM FOLLOW UP STUDY  01/06/2020  . IR ANGIOGRAM FOLLOW UP STUDY  01/06/2020  . IR ANGIOGRAM FOLLOW UP STUDY  01/06/2020  . IR ANGIOGRAM FOLLOW UP STUDY  01/06/2020  . IR ANGIOGRAM FOLLOW UP STUDY  01/06/2020  . IR NEURO EACH ADD'L AFTER BASIC  UNI RIGHT ([place])  01/06/2020  . IR TRANSCATH/EMBOLIZ  01/06/2020  . RADIOLOGY WITH ANESTHESIA N/A 01/06/2020   Procedure: IR WITH ANESTHESIA FOR ANEURYSM;  Surgeon: Consuella Lose, MD;  Location: [place];  Service: Radiology;  Laterality: N/A;  . TUBAL LIGATION      There were no vitals filed for this visit.   Subjective Assessment - 06/24/20 2109    Subjective  Pt reports her appt with the breast surgeon is tomorrow.    Pertinent History ICH 01/06/20, Aneurysm. PMH: OA bilateral knees and hands    Patient Stated Goals Pt would like to be as independent as possible.    Currently in Pain? Yes    Pain Score 1     Pain Location Arm    Pain Orientation Right    Pain Descriptors / Indicators Aching;Tightness    Pain Type Chronic pain    Pain Onset More than a month ago    Pain Frequency Intermittent           Reports her exercises are going well at home.  Moist heat for 5 mins prior to therapeutic exercise to improve ROM and decrease pain.  Therapeutic Exercise: Pt. was able to tolerate AAROM for elbow flexion, elbow extension with resistance, AAROM supination with PROM.PROM for right wrist,  digit MP, PIP, and DIP flexion, and extension, AROM digit MP, PIP, and DIP flexion, and extensionin preparation for grasping objects.  Patient seen for use of juxtaciser for wrist motion actively, patient engaged with task, required some assist for proper hand positioning on handle, assist with adjustments as needed.  Multiple trials completed with cues and occasional hand over hand assist.   Grasp and release of red built-up foam with use of right hand.  Supination and pronation performed while holding foam and hand cues for full  range of motion  Response to treatment: Patient continues to demonstrate improved movement patterns with right upper extremity.  She does continue to demonstrate shoulder hiking and compensatory movement patterns but responds well to verbal and tactile cues to correct.   Continues to be motivated by tasks which have an outcome.  Continue to work towards goals and plan of care, patient has appointment with breast surgeon next day.                     OT Education - 06/24/20 2109    Education Details ROM, pain control, strength, finger ROM, functional tasks at home    Person(s) Educated Patient    Methods Explanation;Demonstration;Verbal cues;Handout    Comprehension Verbalized understanding;Returned demonstration;Verbal cues required            OT Short Term Goals - 06/07/20 1040      OT SHORT TERM GOAL #3   Title Pt will donn shirt and pants with min A    Status Achieved      OT SHORT TERM GOAL #4   Title Pt will demonstrate 30 A/ROM shoulder flexion for RUE with pain less than or equal to 4/10.    Baseline Pt. presents with limited isolated  right shoulder flexion with pain between now at a 2    Time 4    Period Weeks    Status Partially Met    Target Date 07/02/20      OT SHORT TERM GOAL #6   Title Pt/ caregiver will verbalize understanding of RUE positioning and edema control techniques to minimize pain and risk for injury    Baseline Pt. uses a pillow, and has a resting hand slpint. Pt. reports being able to perfrom retrograde massage when needed.    Time --    Period --    Status Achieved    Target Date --      OT SHORT TERM GOAL #7   Title Pt will demonstrate at least 25% finger flexion/ extension in RUE in prep for functional use.    Baseline Limited PROM, and active digit flexion, and extension. Improved AROM for right 2nd, and 3rd digits. significant limitations with the 4th, and 5th digits.    Time 4    Period Weeks    Status Partially Met    Target Date 07/02/20             OT Long Term Goals - 06/07/20 1049      OT LONG TERM GOAL #1   Title Pt will perform  all basic ADLs with supervision    Baseline Pt. requires minA from her husband    Time 12    Period Weeks    Status On-going    Target Date  08/30/20      OT LONG TERM GOAL #2   Title Pt will demonstrate 45* RUE shoulder flexion in prep for functional reach with pain less than or equal to 3/10.  Baseline Pt. presents with limited AROM for isolated  right shoulder flexion with pain 2.    Time 12    Period Weeks    Status Partially Met    Target Date 08/30/20      OT LONG TERM GOAL #3   Title Pt will use RUE as a stabilizer/ gross A at least 30 % of the time for ADLs/ IADLs.    Baseline Pt. is using her right hand as a gross stabilizer 10% of the time.    Time 12    Period Weeks    Status On-going    Target Date 08/30/20      OT LONG TERM GOAL #4   Title Pt will increase LUE grip strength to 25 lbs or greater for increased ease with ADLs.    Baseline Pt. has improved with left grip strength. Pt.continues to work towards 25#.    Time 12    Period Weeks    Status On-going    Target Date 08/30/20      OT LONG TERM GOAL #5   Title Pt will perform simple home management/ snack prep with min A.    Baseline Pt. tries to help when she can, however her husband mostly perfroms these tasks.    Time 12    Period Weeks    Status On-going    Target Date 08/30/20      Long Term Additional Goals   Additional Long Term Goals Yes      OT LONG TERM GOAL #6   Title Pt will demonstrate ability to grasp/ release a cup 2/3 trials with RUE.    Baseline Pt. is unable to grasp/release a cup.    Time 12    Period Weeks    Status On-going    Target Date 08/30/20      OT LONG TERM GOAL #7   Title Pt will safely navigate a busy environment and locate items with supervision and  90% or greater accuracy without bumping into items.    Baseline Pt. is able to navigate independently    Status Achieved      OT LONG TERM GOAL #8   Title I with updated HEP.    Baseline Pt. requires assist, and cuing for HEP.    Time 12    Period Weeks    Status On-going    Target Date 08/30/20      OT LONG TERM GOAL  #9   TITLE Pt. will use her right  hand to assist the left hand in searching for items in, and holding her purse.    Baseline Pt. is unable to perform    Time 12    Period Weeks    Status New    Target Date 08/30/20      OT LONG TERM GOAL  #10   TITLE Pt. will use her right hand to assist the left to independently place eye glasses into a case.    Baseline Pt. is unable to perfrom.    Time 12    Period Weeks    Status New    Target Date 08/30/20                 Plan - 06/25/20 2031    Clinical Impression Statement Patient continues to demonstrate improved movement patterns with right upper extremity.  She does continue to demonstrate shoulder hiking and compensatory movement patterns but responds well to verbal and tactile cues to correct.  Continues to be  motivated by tasks which have an outcome.  Continue to work towards goals and plan of care, patient has appointment with breast surgeon next day.    OT Occupational Profile and History Detailed Assessment- Review of Records and additional review of physical, cognitive, psychosocial history related to current functional performance    Occupational performance deficits (Please refer to evaluation for details): ADL's;IADL's;Rest and Sleep;Work;Play;Leisure    Body Structure / Function / Physical Skills ADL;Balance;Coordination;Decreased knowledge of precautions;Decreased knowledge of use of DME;Dexterity;Edema;Mobility;Tone;Strength;IADL;Sensation;GMC;Gait;ROM;FMC;Flexibility;Pain;Vision;UE functional use;Endurance    Cognitive Skills Attention;Perception;Problem Solve;Safety Awareness;Thought    Rehab Potential Good    Clinical Decision Making Several treatment options, min-mod task modification necessary    Comorbidities Affecting Occupational Performance: None    Modification or Assistance to Complete Evaluation  Min-Moderate modification of tasks or assist with assess necessary to complete eval    OT Frequency 3x / week    OT Duration 12 weeks    OT  Treatment/Interventions Self-care/ADL training;Ultrasound;Energy conservation;Visual/perceptual remediation/compensation;Patient/family education;DME and/or AE instruction;Aquatic Therapy;Paraffin;Gait Training;Passive range of motion;Balance training;Fluidtherapy;Electrical Stimulation;Functional Mobility Training;Splinting;Moist Heat;Therapeutic exercise;Manual Therapy;Cognitive remediation/compensation;Manual lymph drainage;Neuromuscular education;Coping strategies training    Consulted and Agree with Plan of Care Patient;Family member/caregiver           Patient will benefit from skilled therapeutic intervention in order to improve the following deficits and impairments:   Body Structure / Function / Physical Skills: ADL,Balance,Coordination,Decreased knowledge of precautions,Decreased knowledge of use of DME,Dexterity,Edema,Mobility,Tone,Strength,IADL,Sensation,GMC,Gait,ROM,FMC,Flexibility,Pain,Vision,UE functional use,Endurance Cognitive Skills: Attention,Perception,Problem Solve,Safety Awareness,Thought     Visit Diagnosis: Muscle weakness (generalized)  Other lack of coordination  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 Dyspareunia in female 04/05/2020  . History of abnormal cervical Pap smear 03/10/2020  . GIB (gastrointestinal bleeding) 02/28/2020  . Elevated BUN   . Prediabetes   . 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 Cerebral vasospasm   . Sinus tachycardia   . Dysphagia, post-stroke   . Thrombocytopenia ([place])   . Acute blood loss anemia   . Brain aneurysm   . ICH (intracerebral hemorrhage) ([place]) 01/05/2020  . History of adenomatous polyp of colon 02/07/2016  . 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 Fissure in ano 11/11/2014  . Hemorrhoids 11/11/2014  . Constipation 11/19/2013  . GERD (gastroesophageal reflux disease)  03/24/2013  . Irritable bowel syndrome with diarrhea 03/24/2013  . Herpes zoster 05/14/2005  . Abnormal Pap smear of cervix 05/15/1983  . History of cervical dysplasia 05/15/1983   Achilles Dunk, OTR/L, CLT  Erika Stanton 06/25/2020, 9:15 PM  [place] MAIN [facility] SERVICES [address] [place], [place], [address] Phone: [phone]   Fax:  [phone]  Name: Erika Stanton MRN: [id number] Date of Birth: 1956-03-14

## 2020-06-22 NOTE — Therapy (Signed)
[place] MAIN [facility] SERVICES [address] [place], [place], [address] Phone: [phone]   Fax:  [phone]  Speech Language Pathology Treatment  Patient Details  Name: Erika Stanton MRN: [id number] Date of Birth: 04-06-1956 Referring Provider (SLP): Alger Simons, MD   Encounter Date: 06/22/2020   End of Session - 06/22/20 1618    Visit Number 26    Number of Visits 33    Date for SLP Re-Evaluation 07/09/20    Authorization - Visit Number 6    Progress Note Due on Visit 10    SLP Start Time 1400    SLP Stop Time  1450    SLP Time Calculation (min) 50 min    Activity Tolerance Patient tolerated treatment well           Past Medical History:  Diagnosis Date  . Aneurysm ([place])   . Melena     Past Surgical History:  Procedure Laterality Date  . CHOLECYSTECTOMY    . COLONOSCOPY  11/2017   at [facility]. no recurrent polyps.  suggest repeat surveillance study 11/2022.    Marland Kitchen COLONOSCOPY W/ POLYPECTOMY  06/2014   Dr Roxy Manns at [facility].  3 adenomatoous polyps, anal fissure.    . ESOPHAGOGASTRODUODENOSCOPY (EGD) WITH PROPOFOL N/A 01/27/2020   Procedure: ESOPHAGOGASTRODUODENOSCOPY (EGD) WITH PROPOFOL;  Surgeon: Mauri Pole, MD;  Location: [facility] [facility];  Service: Endoscopy;  Laterality: N/A;  . IR 3D INDEPENDENT WKST  01/06/2020  . IR ANGIO INTRA EXTRACRAN SEL INTERNAL CAROTID BILAT MOD SED  01/06/2020  . IR ANGIO VERTEBRAL SEL VERTEBRAL UNI L MOD SED  01/06/2020  . IR ANGIOGRAM FOLLOW UP STUDY  01/06/2020  . IR ANGIOGRAM FOLLOW UP STUDY  01/06/2020  . IR ANGIOGRAM FOLLOW UP STUDY  01/06/2020  . IR ANGIOGRAM FOLLOW UP STUDY  01/06/2020  . IR ANGIOGRAM FOLLOW UP STUDY  01/06/2020  . IR ANGIOGRAM FOLLOW UP STUDY  01/06/2020  . IR ANGIOGRAM FOLLOW UP STUDY  01/06/2020  . IR ANGIOGRAM FOLLOW UP STUDY  01/06/2020  . IR ANGIOGRAM FOLLOW UP STUDY  01/06/2020  . IR ANGIOGRAM FOLLOW UP STUDY  01/06/2020  . IR NEURO EACH ADD'L AFTER BASIC UNI RIGHT (MS)   01/06/2020  . IR TRANSCATH/EMBOLIZ  01/06/2020  . RADIOLOGY WITH ANESTHESIA N/A 01/06/2020   Procedure: IR WITH ANESTHESIA FOR ANEURYSM;  Surgeon: Consuella Lose, MD;  Location: [place];  Service: Radiology;  Laterality: N/A;  . TUBAL LIGATION      There were no vitals filed for this visit.   Subjective Assessment - 06/22/20 1403    Subjective "I forgot the folder, but I did everything on the list."    Pain Score 1     Pain Location Arm                 ADULT SLP TREATMENT - 06/22/20 1618      General Information   Behavior/Cognition Alert;Cooperative    HPI Pt presented with headache and lt hemiplegia on 01-05-20. 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 Stent and embolization of rt ICA on 01-06-20. Pt rec'd ST on CIR for cognition and swallowing and was d/c'd on dys III/thin with supervision level cues for swallow precautions. [place]ose was d/c'd 02-23-20.      Treatment Provided   Treatment provided Cognitive-Linquistic  Cognitive-Linquistic Treatment   Treatment focused on Cognition;Patient/family/caregiver education    Skilled Treatment Targeted problem solving/alternating attention with card task; pt responded within reasonable time, double-checked more difficult responses 80% of the time (and did so with question cue x2 when she did not check). Reports using her written schedule to complete most activities; she forgot to bring folder today but will bring next week. SLP worked with pt to ID appropriate home tasks. Today in mod complex attention tasks (spot differences, visual scanning for numbers) accuracy ranged from 80-100%.      Assessment / Recommendations / Plan   Plan Continue with current plan of care      Progression Toward Goals   Progression toward goals Progressing toward goals             SLP Education - 06/22/20 1618    Education Details cognitive apps for home    Person(s) Educated Patient    Methods Handout;Explanation;Demonstration    Comprehension Verbalized understanding            SLP Short Term Goals - 06/09/20 1249      SLP SHORT TERM GOAL #1   Title pt to complete cognitive linguistic testing    Period --   or 4 total sessions   Status Achieved      SLP SHORT TERM GOAL #2   Title pt will demo emergent awareness 100% with min complex therapy tasks x3 visits    Baseline 03-30-20;    Status Partially Met      SLP SHORT TERM GOAL #3   Title pt will demo appriopriate aspiration precuations with suggested POs x3 sessions    Status Deferred      SLP SHORT TERM GOAL #4   Title pt will solve household probelms using functional solutions x3 sessions    Baseline 03-30-20; 04-11-20    Status Achieved      SLP SHORT TERM GOAL #5   Title pt will demo selective attention (internal distraction) to a simple functional task for 10 minutes with rare min A back to task in 3 sessions    Baseline 03-28-20    Period Weeks    Status Achieved      SLP SHORT TERM GOAL #6   Title Patient will preplan daily schedule 5/7 days.    Time 4    Period Weeks    Status New            SLP Long Term Goals - 05/24/20 1630      SLP LONG TERM GOAL #1   Title pt will demo anticipatory awareness by double checking all work with a non-verbal cue x3 sessions    Time 8    Period Weeks   or 17 total sessions, for all LTGs   Status Partially Met      SLP LONG TERM GOAL #2   Title pt will follow swallow precautions with POs with modified independence x3 sessions    Status Deferred      SLP LONG TERM GOAL #3   Title pt will demo [facility] skills in solving min-mod complex problems in WNL amount of time with modified independence (double checking answers , etc)    Time 8    Period Weeks    Status Partially Met      SLP LONG TERM GOAL #4   Title pt will demo simple alternating  attention in functional cognitive linguistic tasks in 3 sessions    Time 8    Period Weeks  Status Partially Met      SLP LONG TERM GOAL #5   Title Patient will use schedule to complete 3 pre-planned tasks with min cues from spouse.    Time 8    Period Weeks    Status Partially Met            Plan - 06/22/20 1619    Clinical Impression Statement Modine continues to complete assigned tasks at home. She reports follow-through with use of schedule to complete daily tasks, although she forgot her notebook today. Spontaneously double-checked her responses in problem solving/divided attention task today. Continue skilled ST to maximize independence, cognitive function, and participation in daily activities.    Speech Therapy Frequency 1x /week   decrease requested by pt due to insurance coverage   Duration 8 weeks    Treatment/Interventions Aspiration precaution training;Pharyngeal strengthening exercises;Compensatory techniques;Diet toleration management by SLP;Trials of upgraded texture/liquids;Cueing hierarchy;Cognitive reorganization;Internal/external aids;Patient/family education;SLP instruction and feedback;Functional tasks    Potential to Achieve Goals Good    Consulted and Agree with Plan of Care Patient;Family member/caregiver           Patient will benefit from skilled therapeutic intervention in order to improve the following deficits and impairments:   Cognitive communication deficit  Cerebral aneurysm rupture [facility])    Problem List Patient Active Problem List   Diagnosis Date Noted  . Dyspareunia in female 04/05/2020  . History of abnormal cervical Pap smear 03/10/2020  . GIB (gastrointestinal bleeding) 02/28/2020  . Elevated BUN   . Prediabetes   . 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[place]) 01/20/2020  . Cerebral vasospasm   . Sinus tachycardia   . Dysphagia, post-stroke   . Thrombocytopenia ([place])   . Acute blood loss anemia   . Brain aneurysm   . ICH (intracerebral  hemorrhage) ([place]) 01/05/2020  . History of adenomatous polyp of colon 02/07/2016  . 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 Fissure in ano 11/11/2014  . Hemorrhoids 11/11/2014  . Constipation 11/19/2013  . GERD (gastroesophageal reflux disease) 03/24/2013  . Irritable bowel syndrome with diarrhea 03/24/2013  . Herpes zoster 05/14/2005  . Abnormal Pap smear of cervix 05/15/1983  . History of cervical dysplasia 05/15/1983   [name], [place], [place] 06/22/2020, 4:29 PM  [place] MAIN [facility] SERVICES [address] [place], [place], [address] Phone: [phone]   Fax:  [phone]   Name: LILYROSE TANNEY MRN: [id number] Date of Birth: 1955-08-26

## 2020-06-23 ENCOUNTER — Encounter: Payer: Self-pay | Admitting: Occupational Therapy

## 2020-06-24 ENCOUNTER — Other Ambulatory Visit: Payer: Self-pay

## 2020-06-24 ENCOUNTER — Ambulatory Visit: Payer: BC Managed Care – PPO | Admitting: Occupational Therapy

## 2020-06-24 DIAGNOSIS — R278 Other lack of coordination: Secondary | ICD-10-CM

## 2020-06-24 DIAGNOSIS — M6281 Muscle weakness (generalized): Secondary | ICD-10-CM

## 2020-06-24 DIAGNOSIS — I69159 Hemiplegia and hemiparesis following nontraumatic intracerebral hemorrhage affecting unspecified side: Secondary | ICD-10-CM

## 2020-06-24 DIAGNOSIS — R41841 Cognitive communication deficit: Secondary | ICD-10-CM | POA: Diagnosis not present

## 2020-06-25 ENCOUNTER — Encounter: Payer: Self-pay | Admitting: Occupational Therapy

## 2020-06-25 NOTE — Therapy (Addendum)
[place] MAIN [facility] SERVICES [address] [place], [place], [address] Phone: [phone]   Fax:  [phone]  Occupational Therapy Treatment  Patient Details  Name: Erika Stanton MRN: [id number] Date of Birth: 13-Jan-1956 Referring Provider (OT): Dr. Naaman Plummer   Encounter Date: 06/24/2020   OT End of Session - 06/25/20 2200    Visit Number 38    Number of Visits 60    Date for OT Re-Evaluation 08/30/20    Authorization Time Period State BC[employer]    OT Start Time 1000    OT Stop Time 1047    OT Time Calculation (min) 47 min    Activity Tolerance Patient tolerated treatment well    Behavior During Therapy [facility] for tasks assessed/performed           Past Medical History:  Diagnosis Date  . Aneurysm ([place])   . Melena     Past Surgical History:  Procedure Laterality Date  . CHOLECYSTECTOMY    . COLONOSCOPY  11/2017   at [facility]. no recurrent polyps.  suggest repeat surveillance study 11/2022.    Marland Kitchen COLONOSCOPY W/ POLYPECTOMY  06/2014   Dr Roxy Manns at [facility].  3 adenomatoous polyps, anal fissure.    . ESOPHAGOGASTRODUODENOSCOPY (EGD) WITH PROPOFOL N/A 01/27/2020   Procedure: ESOPHAGOGASTRODUODENOSCOPY (EGD) WITH PROPOFOL;  Surgeon: Mauri Pole, MD;  Location: [place] [facility];  Service: Endoscopy;  Laterality: N/A;  . IR 3D INDEPENDENT WKST  01/06/2020  . IR ANGIO INTRA EXTRACRAN SEL INTERNAL CAROTID BILAT MOD SED  01/06/2020  . IR ANGIO VERTEBRAL SEL VERTEBRAL UNI L MOD SED  01/06/2020  . IR ANGIOGRAM FOLLOW UP STUDY  01/06/2020  . IR ANGIOGRAM FOLLOW UP STUDY  01/06/2020  . IR ANGIOGRAM FOLLOW UP STUDY  01/06/2020  . IR ANGIOGRAM FOLLOW UP STUDY  01/06/2020  . IR ANGIOGRAM FOLLOW UP STUDY  01/06/2020  . IR ANGIOGRAM FOLLOW UP STUDY  01/06/2020  . IR ANGIOGRAM FOLLOW UP STUDY  01/06/2020  . IR ANGIOGRAM FOLLOW UP STUDY  01/06/2020  . IR ANGIOGRAM FOLLOW UP STUDY  01/06/2020  . IR ANGIOGRAM FOLLOW UP STUDY  01/06/2020  . IR NEURO EACH ADD'L AFTER BASIC  UNI RIGHT (MS)  01/06/2020  . IR TRANSCATH/EMBOLIZ  01/06/2020  . RADIOLOGY WITH ANESTHESIA N/A 01/06/2020   Procedure: IR WITH ANESTHESIA FOR ANEURYSM;  Surgeon: Consuella Lose, MD;  Location: [place];  Service: Radiology;  Laterality: N/A;  . TUBAL LIGATION      There were no vitals filed for this visit.   Subjective Assessment - 06/25/20 2158    Subjective  Pt reports her appt went well with the breast surgeon.    Pertinent History ICH 01/06/20, Aneurysm. PMH: OA bilateral knees and hands    Patient Stated Goals Pt would like to be as independent as possible.    Currently in Pain? Yes    Pain Score 1     Pain Location Arm    Pain Orientation Right    Pain Descriptors / Indicators Aching    Pain Type Chronic pain    Pain Onset More than a month ago    Pain Frequency Intermittent             Went to see the surgeon yesterday, it was felt she will likely have her brain surgery first then the breast surgery.  If she has brain surgery first then has to wait 3 months, also has to stop some of her medications, she Will be  taking an estrogen blocker pill.  Lumpectomy proposed, deep in left breast  Moist heat for 5 mins prior to therapeutic exercise to improve ROM and decrease pain.   Therapeutic Exercise: Pt. was able to tolerate AAROM for elbow flexion, elbow extension with resistance, AAROM supination with PROM.  PROM for right wrist, digit MP, PIP, and DIP flexion, and extension, AROM digit MP, PIP, and DIP flexion, and extension in preparation for grasping objects.  Patient engaging in reaching tasks from lap to table for multiple trials.  Tabletop ROM with towel for forwards reaching, ABD/ADD and circular patterns, requires occasional hand over hand guiding from therapist especially to get task started.   Wrist ROM with juxaciser this date for 2 trials, cues for wrist movement, assist to place tool in her right hand.   Hand to mouth patterns with right hand, cues for greater flexion  to reach mouth rather than tilt neck down to food item.   Review of HEP for the weekend.  Response to tx:   Patient continues to demonstrate limited ROM in RUE overall.  She is participating more in exercise on a daily basis and becoming more consistent with hand to mouth patterns for self feeding.  Encouraged patient to use right hand for a portion of all meals if possible and then switch to left once her hand and arm fatigues.  Increased ROM of right wrist when using juxtaciser tool and has increased focus on outcome.  Minimal pain at rest 1/10 in right arm, up to 3/10 with movement patterns.  Continued focus on grasping but increased emphasis on finger extension.  Continue to work towards goals in plan of care to improve right functional use.                      OT Education - 06/25/20 2200    Education Details ROM, pain control, strength, finger ROM, functional tasks at home    Person(s) Educated Patient    Methods Explanation;Demonstration;Verbal cues;Handout    Comprehension Verbalized understanding;Returned demonstration;Verbal cues required            OT Short Term Goals - 06/07/20 1040      OT SHORT TERM GOAL #3   Title Pt will donn shirt and pants with min A    Status Achieved      OT SHORT TERM GOAL #4   Title Pt will demonstrate 30 A/ROM shoulder flexion for RUE with pain less than or equal to 4/10.    Baseline Pt. presents with limited isolated  right shoulder flexion with pain between now at a 2    Time 4    Period Weeks    Status Partially Met    Target Date 07/02/20      OT SHORT TERM GOAL #6   Title Pt/ caregiver will verbalize understanding of RUE positioning and edema control techniques to minimize pain and risk for injury    Baseline Pt. uses a pillow, and has a resting hand slpint. Pt. reports being able to perfrom retrograde massage when needed.    Time --    Period --    Status Achieved    Target Date --      OT SHORT TERM GOAL #7    Title Pt will demonstrate at least 25% finger flexion/ extension in RUE in prep for functional use.    Baseline Limited PROM, and active digit flexion, and extension. Improved AROM for right 2nd, and 3rd digits. significant limitations with  the 4th, and 5th digits.    Time 4    Period Weeks    Status Partially Met    Target Date 07/02/20             OT Long Term Goals - 06/07/20 1049      OT LONG TERM GOAL #1   Title Pt will perform  all basic ADLs with supervision    Baseline Pt. requires minA from her husband    Time 12    Period Weeks    Status On-going    Target Date 08/30/20      OT LONG TERM GOAL #2   Title Pt will demonstrate 45* RUE shoulder flexion in prep for functional reach with pain less than or equal to 3/10.    Baseline Pt. presents with limited AROM for isolated  right shoulder flexion with pain 2.    Time 12    Period Weeks    Status Partially Met    Target Date 08/30/20      OT LONG TERM GOAL #3   Title Pt will use RUE as a stabilizer/ gross A at least 30 % of the time for ADLs/ IADLs.    Baseline Pt. is using her right hand as a gross stabilizer 10% of the time.    Time 12    Period Weeks    Status On-going    Target Date 08/30/20      OT LONG TERM GOAL #4   Title Pt will increase LUE grip strength to 25 lbs or greater for increased ease with ADLs.    Baseline Pt. has improved with left grip strength. Pt.continues to work towards 25#.    Time 12    Period Weeks    Status On-going    Target Date 08/30/20      OT LONG TERM GOAL #5   Title Pt will perform simple home management/ snack prep with min A.    Baseline Pt. tries to help when she can, however her husband mostly perfroms these tasks.    Time 12    Period Weeks    Status On-going    Target Date 08/30/20      Long Term Additional Goals   Additional Long Term Goals Yes      OT LONG TERM GOAL #6   Title Pt will demonstrate ability to grasp/ release a cup 2/3 trials with RUE.    Baseline  Pt. is unable to grasp/release a cup.    Time 12    Period Weeks    Status On-going    Target Date 08/30/20      OT LONG TERM GOAL #7   Title Pt will safely navigate a busy environment and locate items with supervision and  90% or greater accuracy without bumping into items.    Baseline Pt. is able to navigate independently    Status Achieved      OT LONG TERM GOAL #8   Title I with updated HEP.    Baseline Pt. requires assist, and cuing for HEP.    Time 12    Period Weeks    Status On-going    Target Date 08/30/20      OT LONG TERM GOAL  #9   TITLE Pt. will use her right hand to assist the left hand in searching for items in, and holding her purse.    Baseline Pt. is unable to perform    Time 12    Period Weeks  Status New    Target Date 08/30/20      OT LONG TERM GOAL  #10   TITLE Pt. will use her right hand to assist the left to independently place eye glasses into a case.    Baseline Pt. is unable to perfrom.    Time 12    Period Weeks    Status New    Target Date 08/30/20                 Plan - 06/25/20 2200    Clinical Impression Statement Pt continues to progress with ROM in RUE and moving towards increased use in daily tasks. Patient continues to demonstrate limited ROM in RUE overall.  She is participating more in exercise on a daily basis and becoming more consistent with hand to mouth patterns for self feeding.  Encouraged patient to use right hand for a portion of all meals if possible and then switch to left once her hand and arm fatigues.  Increased ROM of right wrist when using juxtaciser tool and has increased focus on outcome.  Minimal pain at rest 1/10 in right arm, up to 3/10 with movement patterns.  Continued focus on grasping but increased emphasis on finger extension.  Continue to work towards goals in plan of care to improve right functional use.     OT Occupational Profile and History Detailed Assessment- Review of Records and additional review  of physical, cognitive, psychosocial history related to current functional performance    Occupational performance deficits (Please refer to evaluation for details): ADL's;IADL's;Rest and Sleep;Work;Play;Leisure    Body Structure / Function / Physical Skills ADL;Balance;Coordination;Decreased knowledge of precautions;Decreased knowledge of use of DME;Dexterity;Edema;Mobility;Tone;Strength;IADL;Sensation;GMC;Gait;ROM;FMC;Flexibility;Pain;Vision;UE functional use;Endurance    Cognitive Skills Attention;Perception;Problem Solve;Safety Awareness;Thought    Rehab Potential Good    Clinical Decision Making Several treatment options, min-mod task modification necessary    Comorbidities Affecting Occupational Performance: None    Modification or Assistance to Complete Evaluation  Min-Moderate modification of tasks or assist with assess necessary to complete eval    OT Frequency 3x / week    OT Duration 12 weeks    OT Treatment/Interventions Self-care/ADL training;Ultrasound;Energy conservation;Visual/perceptual remediation/compensation;Patient/family education;DME and/or AE instruction;Aquatic Therapy;Paraffin;Gait Training;Passive range of motion;Balance training;Fluidtherapy;Electrical Stimulation;Functional Mobility Training;Splinting;Moist Heat;Therapeutic exercise;Manual Therapy;Cognitive remediation/compensation;Manual lymph drainage;Neuromuscular education;Coping strategies training    Consulted and Agree with Plan of Care Patient;Family member/caregiver           Patient will benefit from skilled therapeutic intervention in order to improve the following deficits and impairments:   Body Structure / Function / Physical Skills: ADL,Balance,Coordination,Decreased knowledge of precautions,Decreased knowledge of use of DME,Dexterity,Edema,Mobility,Tone,Strength,IADL,Sensation,GMC,Gait,ROM,FMC,Flexibility,Pain,Vision,UE functional use,Endurance Cognitive Skills: Attention,Perception,Problem Solve,Safety  Awareness,Thought     Visit Diagnosis: Muscle weakness (generalized)  Other lack of coordination  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 Dyspareunia in female 04/05/2020  . History of abnormal cervical Pap smear 03/10/2020  . GIB (gastrointestinal bleeding) 02/28/2020  . Elevated BUN   . Prediabetes   . 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[place]) 01/20/2020  . Cerebral vasospasm   . Sinus tachycardia   . Dysphagia, post-stroke   . Thrombocytopenia ([place])   . Acute blood loss anemia   . Brain aneurysm   . ICH (intracerebral hemorrhage) ([place]) 01/05/2020  . History of adenomatous polyp of colon 02/07/2016  . 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 Fissure in ano 11/11/2014  . Hemorrhoids 11/11/2014  .  Constipation 11/19/2013  . GERD (gastroesophageal reflux disease) 03/24/2013  . Irritable bowel syndrome with diarrhea 03/24/2013  . Herpes zoster 05/14/2005  . Abnormal Pap smear of cervix 05/15/1983  . History of cervical dysplasia 05/15/1983   Achilles Dunk, OTR/L, CLT  Marvelous Bouwens 06/25/2020, 10:02 PM  [place] MAIN [facility] SERVICES [address] [place], [place], [address] Phone: [phone]   Fax:  [phone]  Name: Erika Stanton MRN: [id number] Date of Birth: [birth year]

## 2020-06-27 ENCOUNTER — Other Ambulatory Visit: Payer: Self-pay

## 2020-06-27 ENCOUNTER — Ambulatory Visit: Payer: Self-pay

## 2020-06-27 ENCOUNTER — Encounter: Payer: Self-pay | Admitting: Occupational Therapy

## 2020-06-27 ENCOUNTER — Ambulatory Visit: Payer: BC Managed Care – PPO | Admitting: Occupational Therapy

## 2020-06-27 DIAGNOSIS — R41841 Cognitive communication deficit: Secondary | ICD-10-CM | POA: Diagnosis not present

## 2020-06-27 DIAGNOSIS — R278 Other lack of coordination: Secondary | ICD-10-CM

## 2020-06-27 DIAGNOSIS — M6281 Muscle weakness (generalized): Secondary | ICD-10-CM

## 2020-06-27 NOTE — Therapy (Signed)
[place] MAIN [facility] SERVICES [address] [place], [place], [address] Phone: [phone]   Fax:  [phone]  Occupational Therapy Treatment  Patient Details  Name: Erika Stanton MRN: [id number] Date of Birth: Oct 03, 1955 Referring Provider (OT): Dr. Naaman Plummer   Encounter Date: 06/27/2020   OT End of Session - 06/27/20 0925    Visit Number 39    Number of Visits 60    Date for OT Re-Evaluation 08/30/20    OT Start Time 0918    OT Stop Time 1000    OT Time Calculation (min) 42 min    Activity Tolerance Patient tolerated treatment well    Behavior During Therapy [facility] for tasks assessed/performed           Past Medical History:  Diagnosis Date  . Aneurysm ([place])   . Melena     Past Surgical History:  Procedure Laterality Date  . CHOLECYSTECTOMY    . COLONOSCOPY  11/2017   at [facility]. no recurrent polyps.  suggest repeat surveillance study 11/2022.    Marland Kitchen COLONOSCOPY W/ POLYPECTOMY  06/2014   Dr Roxy Manns at [facility].  3 adenomatoous polyps, anal fissure.    . ESOPHAGOGASTRODUODENOSCOPY (EGD) WITH PROPOFOL N/A 01/27/2020   Procedure: ESOPHAGOGASTRODUODENOSCOPY (EGD) WITH PROPOFOL;  Surgeon: Mauri Pole, MD;  Location: [place];  Service: Endoscopy;  Laterality: N/A;  . IR 3D INDEPENDENT WKST  01/06/2020  . IR ANGIO INTRA EXTRACRAN SEL INTERNAL CAROTID BILAT MOD SED  01/06/2020  . IR ANGIO VERTEBRAL SEL VERTEBRAL UNI L MOD SED  01/06/2020  . IR ANGIOGRAM FOLLOW UP STUDY  01/06/2020  . IR ANGIOGRAM FOLLOW UP STUDY  01/06/2020  . IR ANGIOGRAM FOLLOW UP STUDY  01/06/2020  . IR ANGIOGRAM FOLLOW UP STUDY  01/06/2020  . IR ANGIOGRAM FOLLOW UP STUDY  01/06/2020  . IR ANGIOGRAM FOLLOW UP STUDY  01/06/2020  . IR ANGIOGRAM FOLLOW UP STUDY  01/06/2020  . IR ANGIOGRAM FOLLOW UP STUDY  01/06/2020  . IR ANGIOGRAM FOLLOW UP STUDY  01/06/2020  . IR ANGIOGRAM FOLLOW UP STUDY  01/06/2020  . IR NEURO EACH ADD'L AFTER BASIC UNI RIGHT (MS)  01/06/2020  . IR  TRANSCATH/EMBOLIZ  01/06/2020  . RADIOLOGY WITH ANESTHESIA N/A 01/06/2020   Procedure: IR WITH ANESTHESIA FOR ANEURYSM;  Surgeon: Consuella Lose, MD;  Location: [place];  Service: Radiology;  Laterality: N/A;  . TUBAL LIGATION     OT TREATMENT   Therapeutic Exercise:  Pt. was able to tolerate AAROM for elbow flexion, elbow extension with resistance, AAROM supination with PROM.PROM for right wrist, digit MP, PIP, and DIP flexion, and extension, AROM digit MP, PIP, and DIP flexion, and extensionin preparation for grasping objects.Education was provided about opportunities to perform hand to face patterns with the right UE to begin engaging it during self-feeding.  Neuromuscular re-ed:  Pt. worked on functional reaching for vertical coned placed at tabletop height. Emphasis was placed on using a gross grasp, and release them into a container. Pt. required assist, and support proximally.  Pt.reports that she had anappointment with thebreastsurgeon last week. Pt. reports that they have to communicate with the neurosurgeon to determine when the surgeries with be scheduled. Pt. Reports that she just wants to get the surgeries past her.Pt.reports that she is trying to use her hand more at home for self-feeding tasks. Pt. reports that they have increased the amount of times she is doing the exercises at home with her husband.Pt.continues to work on improving  RUE ROM, and functional reaching in preparation for hand to face patterns for self-care tasks.                          OT Education - 06/27/20 0925    Education Details ROM, pain control, strength, finger ROM, functional tasks at home    Person(s) Educated Patient    Methods Explanation;Demonstration;Verbal cues;Handout    Comprehension Verbalized understanding;Returned demonstration;Verbal cues required            OT Short Term Goals - 06/07/20 1040      OT SHORT TERM GOAL #3   Title Pt will donn  shirt and pants with min A    Status Achieved      OT SHORT TERM GOAL #4   Title Pt will demonstrate 30 A/ROM shoulder flexion for RUE with pain less than or equal to 4/10.    Baseline Pt. presents with limited isolated  right shoulder flexion with pain between now at a 2    Time 4    Period Weeks    Status Partially Met    Target Date 07/02/20      OT SHORT TERM GOAL #6   Title Pt/ caregiver will verbalize understanding of RUE positioning and edema control techniques to minimize pain and risk for injury    Baseline Pt. uses a pillow, and has a resting hand slpint. Pt. reports being able to perfrom retrograde massage when needed.    Time --    Period --    Status Achieved    Target Date --      OT SHORT TERM GOAL #7   Title Pt will demonstrate at least 25% finger flexion/ extension in RUE in prep for functional use.    Baseline Limited PROM, and active digit flexion, and extension. Improved AROM for right 2nd, and 3rd digits. significant limitations with the 4th, and 5th digits.    Time 4    Period Weeks    Status Partially Met    Target Date 07/02/20             OT Long Term Goals - 06/07/20 1049      OT LONG TERM GOAL #1   Title Pt will perform  all basic ADLs with supervision    Baseline Pt. requires minA from her husband    Time 12    Period Weeks    Status On-going    Target Date 08/30/20      OT LONG TERM GOAL #2   Title Pt will demonstrate 45* RUE shoulder flexion in prep for functional reach with pain less than or equal to 3/10.    Baseline Pt. presents with limited AROM for isolated  right shoulder flexion with pain 2.    Time 12    Period Weeks    Status Partially Met    Target Date 08/30/20      OT LONG TERM GOAL #3   Title Pt will use RUE as a stabilizer/ gross A at least 30 % of the time for ADLs/ IADLs.    Baseline Pt. is using her right hand as a gross stabilizer 10% of the time.    Time 12    Period Weeks    Status On-going    Target Date  08/30/20      OT LONG TERM GOAL #4   Title Pt will increase LUE grip strength to 25 lbs or greater for increased ease with  ADLs.    Baseline Pt. has improved with left grip strength. Pt.continues to work towards 25#.    Time 12    Period Weeks    Status On-going    Target Date 08/30/20      OT LONG TERM GOAL #5   Title Pt will perform simple home management/ snack prep with min A.    Baseline Pt. tries to help when she can, however her husband mostly perfroms these tasks.    Time 12    Period Weeks    Status On-going    Target Date 08/30/20      Long Term Additional Goals   Additional Long Term Goals Yes      OT LONG TERM GOAL #6   Title Pt will demonstrate ability to grasp/ release a cup 2/3 trials with RUE.    Baseline Pt. is unable to grasp/release a cup.    Time 12    Period Weeks    Status On-going    Target Date 08/30/20      OT LONG TERM GOAL #7   Title Pt will safely navigate a busy environment and locate items with supervision and  90% or greater accuracy without bumping into items.    Baseline Pt. is able to navigate independently    Status Achieved      OT LONG TERM GOAL #8   Title I with updated HEP.    Baseline Pt. requires assist, and cuing for HEP.    Time 12    Period Weeks    Status On-going    Target Date 08/30/20      OT LONG TERM GOAL  #9   TITLE Pt. will use her right hand to assist the left hand in searching for items in, and holding her purse.    Baseline Pt. is unable to perform    Time 12    Period Weeks    Status New    Target Date 08/30/20      OT LONG TERM GOAL  #10   TITLE Pt. will use her right hand to assist the left to independently place eye glasses into a case.    Baseline Pt. is unable to perfrom.    Time 12    Period Weeks    Status New    Target Date 08/30/20                 Plan - 06/27/20 0925    Clinical Impression Statement Pt.reports that she had anappointment with thebreastsurgeon last week. Pt. reports  that they have to communicate with the neurosurgeon to determine when the surgeries with be scheduled. Pt. Reports that she just wants to get the surgeries past her.Pt.reports that she is trying to use her hand more at home for self-feeding tasks. Pt. reports that they have increased the amount of times she is doing the exercises at home with her husband.Pt.continues to work on improving RUE ROM, and functional reaching in preparation for hand to face patterns for self-care tasks.   OT Occupational Profile and History Detailed Assessment- Review of Records and additional review of physical, cognitive, psychosocial history related to current functional performance    Occupational performance deficits (Please refer to evaluation for details): ADL's;IADL's;Rest and Sleep;Work;Play;Leisure    Body Structure / Function / Physical Skills ADL;Balance;Coordination;Decreased knowledge of precautions;Decreased knowledge of use of DME;Dexterity;Edema;Mobility;Tone;Strength;IADL;Sensation;GMC;Gait;ROM;FMC;Flexibility;Pain;Vision;UE functional use;Endurance    Cognitive Skills Attention;Perception;Problem Solve;Safety Awareness;Thought    Rehab Potential Good    Clinical Decision Making  Several treatment options, min-mod task modification necessary    Comorbidities Affecting Occupational Performance: None    Modification or Assistance to Complete Evaluation  Min-Moderate modification of tasks or assist with assess necessary to complete eval    OT Frequency 3x / week    OT Duration 12 weeks    OT Treatment/Interventions Self-care/ADL training;Ultrasound;Energy conservation;Visual/perceptual remediation/compensation;Patient/family education;DME and/or AE instruction;Aquatic Therapy;Paraffin;Gait Training;Passive range of motion;Balance training;Fluidtherapy;Electrical Stimulation;Functional Mobility Training;Splinting;Moist Heat;Therapeutic exercise;Manual Therapy;Cognitive remediation/compensation;Manual lymph  drainage;Neuromuscular education;Coping strategies training    Consulted and Agree with Plan of Care Patient;Family member/caregiver           Patient will benefit from skilled therapeutic intervention in order to improve the following deficits and impairments:   Body Structure / Function / Physical Skills: ADL,Balance,Coordination,Decreased knowledge of precautions,Decreased knowledge of use of DME,Dexterity,Edema,Mobility,Tone,Strength,IADL,Sensation,GMC,Gait,ROM,FMC,Flexibility,Pain,Vision,UE functional use,Endurance Cognitive Skills: Attention,Perception,Problem Solve,Safety Awareness,Thought     Visit Diagnosis: Muscle weakness (generalized)  Other lack of coordination    Problem List Patient Active Problem List   Diagnosis Date Noted  . Dyspareunia in female 04/05/2020  . History of abnormal cervical Pap smear 03/10/2020  . GIB (gastrointestinal bleeding) 02/28/2020  . Elevated BUN   . Prediabetes   . 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 Cerebral vasospasm   . Sinus tachycardia   . Dysphagia, post-stroke   . Thrombocytopenia ([place])   . Acute blood loss anemia   . Brain aneurysm   . ICH (intracerebral hemorrhage) ([place]) 01/05/2020  . History of adenomatous polyp of colon 02/07/2016  . 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 Fissure in ano 11/11/2014  . Hemorrhoids 11/11/2014  . Constipation 11/19/2013  . GERD (gastroesophageal reflux disease) 03/24/2013  . Irritable bowel syndrome with diarrhea 03/24/2013  . Herpes zoster 05/14/2005  . Abnormal Pap smear of cervix 05/15/1983  . History of cervical dysplasia 05/15/1983    Harrel Carina, MS, OTR/L 06/27/2020, 10:07 AM  [place] MAIN [facility] SERVICES [address] [place], [place], [address] Phone: [phone]   Fax:  [phone]  Name: Erika Stanton MRN: [id number] Date of Birth: 05-14-1956

## 2020-06-29 ENCOUNTER — Encounter: Payer: Self-pay | Admitting: Occupational Therapy

## 2020-06-29 ENCOUNTER — Ambulatory Visit: Payer: BC Managed Care – PPO | Admitting: Speech Pathology

## 2020-06-29 ENCOUNTER — Other Ambulatory Visit: Payer: Self-pay

## 2020-06-29 ENCOUNTER — Ambulatory Visit: Payer: Self-pay

## 2020-06-29 ENCOUNTER — Ambulatory Visit: Payer: BC Managed Care – PPO | Admitting: Occupational Therapy

## 2020-06-29 DIAGNOSIS — R41841 Cognitive communication deficit: Secondary | ICD-10-CM

## 2020-06-29 DIAGNOSIS — I607 Nontraumatic subarachnoid hemorrhage from unspecified intracranial artery: Secondary | ICD-10-CM

## 2020-06-29 DIAGNOSIS — M6281 Muscle weakness (generalized): Secondary | ICD-10-CM

## 2020-06-29 DIAGNOSIS — R278 Other lack of coordination: Secondary | ICD-10-CM

## 2020-06-29 NOTE — Therapy (Signed)
[place] MAIN [facility] SERVICES [address] [place], [place], [address] Phone: [phone]   Fax:  [phone]  Speech Language Pathology Treatment  Patient Details  Name: Erika Stanton MRN: [id number] Date of Birth: 03/03/1956 Referring Provider (SLP): Alger Simons, MD   Encounter Date: 06/29/2020   End of Session - 06/29/20 1250    Visit Number 27    Number of Visits 33    Date for SLP Re-Evaluation 07/09/20    Authorization - Visit Number 7    Progress Note Due on Visit 10    SLP Start Time 0903    SLP Stop Time  0954    SLP Time Calculation (min) 51 min    Activity Tolerance Patient tolerated treatment well           Past Medical History:  Diagnosis Date  . Aneurysm ([place])   . Melena     Past Surgical History:  Procedure Laterality Date  . CHOLECYSTECTOMY    . COLONOSCOPY  11/2017   at [facility]. no recurrent polyps.  suggest repeat surveillance study 11/2022.    Marland Kitchen COLONOSCOPY W/ POLYPECTOMY  06/2014   Dr Roxy Manns at [facility].  3 adenomatoous polyps, anal fissure.    . ESOPHAGOGASTRODUODENOSCOPY (EGD) WITH PROPOFOL N/A 01/27/2020   Procedure: ESOPHAGOGASTRODUODENOSCOPY (EGD) WITH PROPOFOL;  Surgeon: Mauri Pole, MD;  Location: [place] [facility];  Service: Endoscopy;  Laterality: N/A;  . IR 3D INDEPENDENT WKST  01/06/2020  . IR ANGIO INTRA EXTRACRAN SEL INTERNAL CAROTID BILAT MOD SED  01/06/2020  . IR ANGIO VERTEBRAL SEL VERTEBRAL UNI L MOD SED  01/06/2020  . IR ANGIOGRAM FOLLOW UP STUDY  01/06/2020  . IR ANGIOGRAM FOLLOW UP STUDY  01/06/2020  . IR ANGIOGRAM FOLLOW UP STUDY  01/06/2020  . IR ANGIOGRAM FOLLOW UP STUDY  01/06/2020  . IR ANGIOGRAM FOLLOW UP STUDY  01/06/2020  . IR ANGIOGRAM FOLLOW UP STUDY  01/06/2020  . IR ANGIOGRAM FOLLOW UP STUDY  01/06/2020  . IR ANGIOGRAM FOLLOW UP STUDY  01/06/2020  . IR ANGIOGRAM FOLLOW UP STUDY  01/06/2020  . IR ANGIOGRAM FOLLOW UP STUDY  01/06/2020  . IR NEURO EACH ADD'L AFTER BASIC UNI RIGHT (MS)   01/06/2020  . IR TRANSCATH/EMBOLIZ  01/06/2020  . RADIOLOGY WITH ANESTHESIA N/A 01/06/2020   Procedure: IR WITH ANESTHESIA FOR ANEURYSM;  Surgeon: Consuella Lose, MD;  Location: [place];  Service: Radiology;  Laterality: N/A;  . TUBAL LIGATION      There were no vitals filed for this visit.   Subjective Assessment - 06/29/20 1244    Subjective "I was thinking we'd be done."    Currently in Pain? Yes    Pain Score 1     Pain Location Arm                 ADULT SLP TREATMENT - 06/29/20 1245      General Information   Behavior/Cognition Alert;Cooperative    HPI Pt presented with headache and lt hemiplegia on 01-05-20. 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 Stent and embolization of rt ICA on 01-06-20. Pt rec'd ST on CIR for cognition and swallowing and was d/c'd on dys III/thin with supervision level cues for swallow precautions. Erika Stanton was d/c'd 02-23-20.      Treatment Provided   Treatment provided  Cognitive-Linquistic      Cognitive-Linquistic Treatment   Treatment focused on Cognition;Patient/family/caregiver education    Skilled Treatment SLP facilitated discussion re: plan of care and goals; patient stated she would be ready to conclude ST in February, after next visit on 2/22. We discussed pt goals; she was able to name targets such as "attention, not being so impulsive, double checking." She feels she is improving these things at home. She reports follow-through with using her schedule at home to plan her day, daily. 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 She did not remember to bring her folder today. Targeted verbal problem solving with mod complex functional scenarios. Patient required rare question cues to identify the correct problem, and occasional mod cues to generate an  appropriate solution. As task progressed, pt took more time with her responses and demonstrated better flexibility in identifying multiple solutions.      Assessment / Recommendations / Plan   Plan Continue with current plan of care   d/c after next session; pt in agreement     Progression Toward Goals   Progression toward goals Progressing toward goals            SLP Education - 06/29/20 1250    Education Details difference between alternating and divided attention    Person(s) Educated Patient    Methods Explanation    Comprehension Verbalized understanding            SLP Short Term Goals - 06/09/20 1249      SLP SHORT TERM GOAL #1   Title pt to complete cognitive linguistic testing    Period --   or 4 total sessions   Status Achieved      SLP SHORT TERM GOAL #2   Title pt will demo emergent awareness 100% with min complex therapy tasks x3 visits    Baseline 03-30-20;    Status Partially Met      SLP SHORT TERM GOAL #3   Title pt will demo appriopriate aspiration precuations with suggested POs x3 sessions    Status Deferred      SLP SHORT TERM GOAL #4   Title pt will solve household probelms using functional solutions x3 sessions    Baseline 03-30-20; 04-11-20    Status Achieved      SLP SHORT TERM GOAL #5   Title pt will demo selective attention (internal distraction) to a simple functional task for 10 minutes with rare min A back to task in 3 sessions    Baseline 03-28-20    Period Weeks    Status Achieved      SLP SHORT TERM GOAL #6   Title Patient will preplan daily schedule 5/7 days.    Time 4    Period Weeks    Status New            SLP Long Term Goals - 05/24/20 1630      SLP LONG TERM GOAL #1   Title pt will demo anticipatory awareness by double checking all work with a non-verbal cue x3 sessions    Time 8    Period Weeks   or 17 total sessions, for all LTGs   Status Partially Met      SLP LONG TERM GOAL #2   Title pt will follow swallow  precautions with POs with modified independence x3 sessions    Status Deferred      SLP LONG TERM GOAL #3   Title pt will demo [facility] skills  in solving min-mod complex problems in WNL amount of time with modified independence (double checking answers , etc)    Time 8    Period Weeks    Status Partially Met      SLP LONG TERM GOAL #4   Title pt will demo simple alternating attention in functional cognitive linguistic tasks in 3 sessions    Time 8    Period Weeks    Status Partially Met      SLP LONG TERM GOAL #5   Title Patient will use schedule to complete 3 pre-planned tasks with min cues from spouse.    Time 8    Period Weeks    Status Partially Met            Plan - 06/29/20 1251    Clinical Impression Statement Erika Stanton reports improved participation in household planning and chores. She continues to improve impulsivity; anticipate she will continue to require cues occasionally for consistent use of strategies and for motivation. Patient feels ready to conclude ST after next visit. Continue skilled ST to maximize independence, cognitive function, and participation in daily activities.    Speech Therapy Frequency 1x /week   decrease requested by pt due to insurance coverage   Duration 8 weeks    Treatment/Interventions Aspiration precaution training;Pharyngeal strengthening exercises;Compensatory techniques;Diet toleration management by SLP;Trials of upgraded texture/liquids;Cueing hierarchy;Cognitive reorganization;Internal/external aids;Patient/family education;SLP instruction and feedback;Functional tasks    Potential to Achieve Goals Good    Consulted and Agree with Plan of Care Patient;Family member/caregiver           Patient will benefit from skilled therapeutic intervention in order to improve the following deficits and impairments:   Cognitive communication deficit  Cerebral aneurysm rupture [facility])    Problem List Patient Active Problem List   Diagnosis Date Noted   . Dyspareunia in female 04/05/2020  . History of abnormal cervical Pap smear 03/10/2020  . GIB (gastrointestinal bleeding) 02/28/2020  . Elevated BUN   . Prediabetes   . 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 Cerebral vasospasm   . Sinus tachycardia   . Dysphagia, post-stroke   . Thrombocytopenia ([place])   . Acute blood loss anemia   . Brain aneurysm   . ICH (intracerebral hemorrhage) ([place]) 01/05/2020  . History of adenomatous polyp of colon 02/07/2016  . 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 Fissure in ano 11/11/2014  . Hemorrhoids 11/11/2014  . Constipation 11/19/2013  . GERD (gastroesophageal reflux disease) 03/24/2013  . Irritable bowel syndrome with diarrhea 03/24/2013  . Herpes zoster 05/14/2005  . Abnormal Pap smear of cervix 05/15/1983  . History of cervical dysplasia 05/15/1983   [name], [place], [place] E Maksymilian Mabey 06/29/2020, 1:02 PM  [place] MAIN [facility] SERVICES [address] [place], [place], [address] Phone: [phone]   Fax:  [phone]   Name: Erika Stanton MRN: [id number] Date of Birth: 1955-06-08

## 2020-06-29 NOTE — Therapy (Signed)
[place] MAIN [facility] SERVICES [address] [place], [place], [address] Phone: [phone]   Fax:  [phone]  Occupational Therapy Progress Note  Dates of reporting period  06/07/2020  to  06/29/2020   Patient Details  Name: Erika Stanton MRN: [id number] Date of Birth: 1955/07/25 Referring Provider (OT): Dr. Naaman Plummer   Encounter Date: 06/29/2020   OT End of Session - 06/29/20 1214    Visit Number 40    Number of Visits 74    Date for OT Re-Evaluation 08/30/20    Authorization Type Progress report period starting 06/29/2020    Authorization Time Period State BC[employer]    OT Start Time 1100    OT Stop Time 1145    OT Time Calculation (min) 45 min    Activity Tolerance Patient tolerated treatment well    Behavior During Therapy [facility] for tasks assessed/performed           Past Medical History:  Diagnosis Date  . Aneurysm ([place])   . Melena     Past Surgical History:  Procedure Laterality Date  . CHOLECYSTECTOMY    . COLONOSCOPY  11/2017   at [facility]. no recurrent polyps.  suggest repeat surveillance study 11/2022.    Marland Kitchen COLONOSCOPY W/ POLYPECTOMY  06/2014   Dr Roxy Manns at [facility].  3 adenomatoous polyps, anal fissure.    . ESOPHAGOGASTRODUODENOSCOPY (EGD) WITH PROPOFOL N/A 01/27/2020   Procedure: ESOPHAGOGASTRODUODENOSCOPY (EGD) WITH PROPOFOL;  Surgeon: Mauri Pole, MD;  Location: [place] [facility];  Service: Endoscopy;  Laterality: N/A;  . IR 3D INDEPENDENT WKST  01/06/2020  . IR ANGIO INTRA EXTRACRAN SEL INTERNAL CAROTID BILAT MOD SED  01/06/2020  . IR ANGIO VERTEBRAL SEL VERTEBRAL UNI L MOD SED  01/06/2020  . IR ANGIOGRAM FOLLOW UP STUDY  01/06/2020  . IR ANGIOGRAM FOLLOW UP STUDY  01/06/2020  . IR ANGIOGRAM FOLLOW UP STUDY  01/06/2020  . IR ANGIOGRAM FOLLOW UP STUDY  01/06/2020  . IR ANGIOGRAM FOLLOW UP STUDY  01/06/2020  . IR ANGIOGRAM FOLLOW UP STUDY  01/06/2020  . IR ANGIOGRAM FOLLOW UP STUDY  01/06/2020  . IR ANGIOGRAM FOLLOW UP STUDY  01/06/2020   . IR ANGIOGRAM FOLLOW UP STUDY  01/06/2020  . IR ANGIOGRAM FOLLOW UP STUDY  01/06/2020  . IR NEURO EACH ADD'L AFTER BASIC UNI RIGHT (MS)  01/06/2020  . IR TRANSCATH/EMBOLIZ  01/06/2020  . RADIOLOGY WITH ANESTHESIA N/A 01/06/2020   Procedure: IR WITH ANESTHESIA FOR ANEURYSM;  Surgeon: Consuella Lose, MD;  Location: [place];  Service: Radiology;  Laterality: N/A;  . TUBAL LIGATION      There were no vitals filed for this visit.   Subjective Assessment - 06/29/20 1214    Subjective  Pt reports her appt went well with the breast surgeon.    Pertinent History ICH 01/06/20, Aneurysm. PMH: OA bilateral knees and hands    Patient Stated Goals Pt would like to be as independent as possible.    Currently in Pain? Yes    Pain Score 2     Pain Location Shoulder    Pain Orientation Right    Pain Descriptors / Indicators Aching    Multiple Pain Sites Yes    Pain Score 5    Pain Location Arm    Pain Orientation Right    Pain Descriptors / Indicators Aching          Pt. is trying to engage her right hand more during ADL tasks at home. Pt.  Is using her right hand to reach up, and feed herself chips, and wipe her face. Pt. Is initiating active grasp, and release with her 2nd digit, 3rd digit, and thumb. Pt. has difficulty releasing wider objects. Pt. continues to present with limited RUE ROM, strength, motor control, and [facility] skills. Pt. presents with pain initially with right thumb motion. Pain in the right shoulder is improving.  Pt. continues to present with limited UE functioning, ROM, strength, and continues to work towards  Improving, and maximizing independence with ADLs, and IADLs.                      OT Education - 06/29/20 1214    Education Details ROM, pain control, strength, finger ROM, functional tasks at home    Person(s) Educated Patient    Methods Explanation;Demonstration;Verbal cues;Handout    Comprehension Verbalized understanding;Returned demonstration;Verbal  cues required            OT Short Term Goals - 06/29/20 1217      OT SHORT TERM GOAL #4   Title Pt will demonstrate 30 A/ROM shoulder flexion for RUE with pain less than or equal to 4/10.    Baseline Pt. presents with limited isolated  right shoulder flexion with pain continues to be 2    Time 4    Period Weeks    Status Deferred      OT SHORT TERM GOAL #7   Title Pt will demonstrate at least 25% finger flexion/ extension in RUE in prep for functional use.    Baseline Limited PROM, and active digit flexion, and extension. Improved AROM for right 2nd, and 3rd digits. significant limitations with the 4th, and 5th digits.    Time 4    Period Weeks    Status Deferred             OT Long Term Goals - 06/29/20 1221      OT LONG TERM GOAL #1   Title Pt will perform  all basic ADLs with supervision    Baseline Pt. requires minA from her husband    Time 12    Period Weeks    Status On-going    Target Date 08/30/20      OT LONG TERM GOAL #2   Title Pt will demonstrate 45* RUE shoulder flexion in prep for functional reach with pain less than or equal to 3/10.    Baseline Pt. conitnues to present with limited AROM for isolated  right shoulder flexion with pain 2.    Time 12    Period Weeks    Status Partially Met    Target Date 08/30/20      OT LONG TERM GOAL #3   Title Pt will use RUE as a stabilizer/ gross A at least 30 % of the time for ADLs/ IADLs.    Baseline Pt. is using her right hand as a gross stabilizer 15% of the time.    Time 12    Period Weeks    Status On-going    Target Date 08/30/20      OT LONG TERM GOAL #4   Title Pt will increase LUE grip strength to 25 lbs or greater for increased ease with ADLs.    Baseline Pt. has improved with left grip strength. Pt.continues to work towards 25#.    Time 12    Period Weeks    Status On-going    Target Date 08/30/20      OT LONG  TERM GOAL #5   Title Pt will perform simple home management/ snack prep with min A.     Baseline Pt. conitnues to try to help when she can, however her husband mostly perfroms these tasks.    Time 12    Period Weeks    Status On-going    Target Date 08/30/20      OT LONG TERM GOAL #6   Title Pt will demonstrate ability to grasp/ release a cup 2/3 trials with RUE.    Baseline Pt. is initiating grasp, and active release of objects, however is unable to grasp/release a cup.    Time 12    Period Weeks    Status On-going    Target Date 08/30/20                 Plan - 06/29/20 1217    Clinical Impression Statement Pt. is trying to engage her right hand more during ADL tasks at home. Pt. Is using her right hand to reach up, and feed herself chips, and wipe her face. Pt. Is initiating active grasp, and release with her 2nd digit, 3rd digit, and thumb. Pt. has difficulty releasing wider objects. Pt. continues to present with limited RUE ROM, strength, motor control, and [facility] skills. Pt. presents with pain initially with right thumb motion. Pain in the right shoulder is improving.  Pt. continues to present with limited UE functioning, ROM, strength, and continues to work towards  Improving, and maximizing independence with ADLs, and IADLs.      OT Occupational Profile and History Detailed Assessment- Review of Records and additional review of physical, cognitive, psychosocial history related to current functional performance    Occupational performance deficits (Please refer to evaluation for details): ADL's;IADL's;Rest and Sleep;Work;Play;Leisure    Body Structure / Function / Physical Skills ADL;Balance;Coordination;Decreased knowledge of precautions;Decreased knowledge of use of DME;Dexterity;Edema;Mobility;Tone;Strength;IADL;Sensation;GMC;Gait;ROM;FMC;Flexibility;Pain;Vision;UE functional use;Endurance    Cognitive Skills Attention;Perception;Problem Solve;Safety Awareness;Thought    Rehab Potential Good    Clinical Decision Making Several treatment options, min-mod task  modification necessary    Comorbidities Affecting Occupational Performance: None    Modification or Assistance to Complete Evaluation  Min-Moderate modification of tasks or assist with assess necessary to complete eval    OT Frequency 3x / week    OT Duration 12 weeks    OT Treatment/Interventions Self-care/ADL training;Ultrasound;Energy conservation;Visual/perceptual remediation/compensation;Patient/family education;DME and/or AE instruction;Aquatic Therapy;Paraffin;Gait Training;Passive range of motion;Balance training;Fluidtherapy;Electrical Stimulation;Functional Mobility Training;Splinting;Moist Heat;Therapeutic exercise;Manual Therapy;Cognitive remediation/compensation;Manual lymph drainage;Neuromuscular education;Coping strategies training    Plan Pt is transferring her care to [facility] as it is closer to home. She remains very fearful of moving RUE and responds well to heat, and estim.    Consulted and Agree with Plan of Care Patient;Family member/caregiver           Patient will benefit from skilled therapeutic intervention in order to improve the following deficits and impairments:   Body Structure / Function / Physical Skills: ADL,Balance,Coordination,Decreased knowledge of precautions,Decreased knowledge of use of DME,Dexterity,Edema,Mobility,Tone,Strength,IADL,Sensation,GMC,Gait,ROM,FMC,Flexibility,Pain,Vision,UE functional use,Endurance Cognitive Skills: Attention,Perception,Problem Solve,Safety Awareness,Thought     Visit Diagnosis: Muscle weakness (generalized)  Other lack of coordination    Problem List Patient Active Problem List   Diagnosis Date Noted  . Dyspareunia in female 04/05/2020  . History of abnormal cervical Pap smear 03/10/2020  . GIB (gastrointestinal bleeding) 02/28/2020  . Elevated BUN   . Prediabetes   . 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 Cerebral vasospasm   . Sinus tachycardia   . Dysphagia,  post-stroke   . Thrombocytopenia ([place])    . Acute blood loss anemia   . Brain aneurysm   . ICH (intracerebral hemorrhage) ([place]) 01/05/2020  . History of adenomatous polyp of colon 02/07/2016  . 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 Fissure in ano 11/11/2014  . Hemorrhoids 11/11/2014  . Constipation 11/19/2013  . GERD (gastroesophageal reflux disease) 03/24/2013  . Irritable bowel syndrome with diarrhea 03/24/2013  . Herpes zoster 05/14/2005  . Abnormal Pap smear of cervix 05/15/1983  . History of cervical dysplasia 05/15/1983    Harrel Carina, MS, OTR/L 06/29/2020, 5:59 PM  [place] MAIN [facility] SERVICES [address] [place], [place], [address] Phone: [phone]   Fax:  [phone]  Name: Erika Stanton MRN: [id number] Date of Birth: 01-Aug-1955

## 2020-07-01 ENCOUNTER — Ambulatory Visit: Payer: BC Managed Care – PPO | Admitting: Occupational Therapy

## 2020-07-01 ENCOUNTER — Other Ambulatory Visit: Payer: Self-pay

## 2020-07-01 DIAGNOSIS — R278 Other lack of coordination: Secondary | ICD-10-CM

## 2020-07-01 DIAGNOSIS — R41841 Cognitive communication deficit: Secondary | ICD-10-CM | POA: Diagnosis not present

## 2020-07-01 DIAGNOSIS — M6281 Muscle weakness (generalized): Secondary | ICD-10-CM

## 2020-07-01 DIAGNOSIS — I69159 Hemiplegia and hemiparesis following nontraumatic intracerebral hemorrhage affecting unspecified side: Secondary | ICD-10-CM

## 2020-07-03 ENCOUNTER — Encounter: Payer: Self-pay | Admitting: Occupational Therapy

## 2020-07-03 NOTE — Therapy (Signed)
[place] MAIN [facility] SERVICES [address] [place], [place], [address] Phone: [phone]   Fax:  [phone]  Occupational Therapy Treatment  Patient Details  Name: Erika Stanton MRN: [id number] Date of Birth: 05-11-1956 Referring Provider (OT): Dr. Naaman Plummer   Encounter Date: 07/01/2020   OT End of Session - 07/03/20 1739    Visit Number 41    Number of Visits 52    Date for OT Re-Evaluation 08/30/20    Authorization Type Progress report period starting 06/29/2020    Authorization Time [place]    OT Start Time 1100    OT Stop Time 1147    OT Time Calculation (min) 47 min    Activity Tolerance Patient tolerated treatment well    Behavior During Therapy [facility] for tasks assessed/performed           Past Medical History:  Diagnosis Date  . Aneurysm ([place])   . Melena     Past Surgical History:  Procedure Laterality Date  . CHOLECYSTECTOMY    . COLONOSCOPY  11/2017   at [facility]. no recurrent polyps.  suggest repeat surveillance study 11/2022.    Marland Kitchen COLONOSCOPY W/ POLYPECTOMY  06/2014   Dr Roxy Manns at [facility].  3 adenomatoous polyps, anal fissure.    . ESOPHAGOGASTRODUODENOSCOPY (EGD) WITH PROPOFOL N/A 01/27/2020   Procedure: ESOPHAGOGASTRODUODENOSCOPY (EGD) WITH PROPOFOL;  Surgeon: Mauri Pole, MD;  Location: [place] [facility];  Service: Endoscopy;  Laterality: N/A;  . IR 3D INDEPENDENT WKST  01/06/2020  . IR ANGIO INTRA EXTRACRAN SEL INTERNAL CAROTID BILAT MOD SED  01/06/2020  . IR ANGIO VERTEBRAL SEL VERTEBRAL UNI L MOD SED  01/06/2020  . IR ANGIOGRAM FOLLOW UP STUDY  01/06/2020  . IR ANGIOGRAM FOLLOW UP STUDY  01/06/2020  . IR ANGIOGRAM FOLLOW UP STUDY  01/06/2020  . IR ANGIOGRAM FOLLOW UP STUDY  01/06/2020  . IR ANGIOGRAM FOLLOW UP STUDY  01/06/2020  . IR ANGIOGRAM FOLLOW UP STUDY  01/06/2020  . IR ANGIOGRAM FOLLOW UP STUDY  01/06/2020  . IR ANGIOGRAM FOLLOW UP STUDY  01/06/2020  . IR ANGIOGRAM FOLLOW UP STUDY  01/06/2020  . IR  ANGIOGRAM FOLLOW UP STUDY  01/06/2020  . IR NEURO EACH ADD'L AFTER BASIC UNI RIGHT (MS)  01/06/2020  . IR TRANSCATH/EMBOLIZ  01/06/2020  . RADIOLOGY WITH ANESTHESIA N/A 01/06/2020   Procedure: IR WITH ANESTHESIA FOR ANEURYSM;  Surgeon: Consuella Lose, MD;  Location: [place];  Service: Radiology;  Laterality: N/A;  . TUBAL LIGATION      There were no vitals filed for this visit.   Subjective Assessment - 07/03/20 1737    Subjective  Reports she has been doing towel slides, grasping and releasing of objects at home. Self feeding with potato chips only with right hand.  Holding makeup item in right hand to then take off the top.  Pulling tissues out of the box.    Pertinent History ICH 01/06/20, Aneurysm. PMH: OA bilateral knees and hands    Patient Stated Goals Pt would like to be as independent as possible.    Currently in Pain? Yes    Pain Score 2     Pain Location Arm    Pain Orientation Right    Pain Descriptors / Indicators Aching    Pain Type Chronic pain    Pain Onset More than a month ago    Pain Frequency Intermittent           Pt reports pain right  shoulder and hand 1/10  Moist heat to right shoulder and hand prior to therapeutic exercise for 5 mins, skin inspected prior to and after with no issues.   Shoulder shrugs, retraction and shoulder rolls active assist with therapist and followed by AROM for each exercise with cues as needed.   Pt seen for PROM of RUE, all joints and available planes of motion, followed by AAROM with forward reaching, ABD, elbow flexion/extension, supination of forearm, wrist flexion/extension, digit extension with prolonged stretch.  Patient performing forward reach between legs towards feet.  Reaching with right arm towards knee.    Manipulation of jumbo pegs, difficulty picking up from tabletop, able to take from therapist, grasp and then release into container on the floor. Cues and guiding as needed.  Response to tx: Pt more consistent with  recall of exercises and working towards more active movement of the right UE.  Patient responds well to moist heat prior to exercises and able to move in a greater range of motion.  Improved grasp and release of objects with right hand but still has difficulty with picking up from container or tabletop.  Continue to work towards goals in plan of care to improve ROM, strength and use of right UE in daily activities.                    OT Education - 07/03/20 1738    Education Details ROM, pain control, strength, finger ROM, functional tasks at home    Person(s) Educated Patient    Methods Explanation;Demonstration;Verbal cues;Handout    Comprehension Verbalized understanding;Returned demonstration;Verbal cues required            OT Short Term Goals - 06/29/20 1217      OT SHORT TERM GOAL #4   Title Pt will demonstrate 30 A/ROM shoulder flexion for RUE with pain less than or equal to 4/10.    Baseline Pt. presents with limited isolated  right shoulder flexion with pain continues to be 2    Time 4    Period Weeks    Status Deferred      OT SHORT TERM GOAL #7   Title Pt will demonstrate at least 25% finger flexion/ extension in RUE in prep for functional use.    Baseline Limited PROM, and active digit flexion, and extension. Improved AROM for right 2nd, and 3rd digits. significant limitations with the 4th, and 5th digits.    Time 4    Period Weeks    Status Deferred             OT Long Term Goals - 06/29/20 1221      OT LONG TERM GOAL #1   Title Pt will perform  all basic ADLs with supervision    Baseline Pt. requires minA from her husband    Time 12    Period Weeks    Status On-going    Target Date 08/30/20      OT LONG TERM GOAL #2   Title Pt will demonstrate 45* RUE shoulder flexion in prep for functional reach with pain less than or equal to 3/10.    Baseline Pt. conitnues to present with limited AROM for isolated  right shoulder flexion with pain 2.     Time 12    Period Weeks    Status Partially Met    Target Date 08/30/20      OT LONG TERM GOAL #3   Title Pt will use RUE as a stabilizer/ gross  A at least 30 % of the time for ADLs/ IADLs.    Baseline Pt. is using her right hand as a gross stabilizer 15% of the time.    Time 12    Period Weeks    Status On-going    Target Date 08/30/20      OT LONG TERM GOAL #4   Title Pt will increase LUE grip strength to 25 lbs or greater for increased ease with ADLs.    Baseline Pt. has improved with left grip strength. Pt.continues to work towards 25#.    Time 12    Period Weeks    Status On-going    Target Date 08/30/20      OT LONG TERM GOAL #5   Title Pt will perform simple home management/ snack prep with min A.    Baseline Pt. conitnues to try to help when she can, however her husband mostly perfroms these tasks.    Time 12    Period Weeks    Status On-going    Target Date 08/30/20      OT LONG TERM GOAL #6   Title Pt will demonstrate ability to grasp/ release a cup 2/3 trials with RUE.    Baseline Pt. is initialing grasp, and active release of objects, however is unable to grasp/release a cup.    Time 12    Period Weeks    Status On-going    Target Date 08/30/20      OT LONG TERM GOAL #8   Title I with updated HEP.    Baseline Pt. requires assist, and cuing for HEP.    Time 12    Period Weeks    Status On-going    Target Date 08/30/20      OT LONG TERM GOAL  #9   TITLE Pt. will use her right hand to assist the left hand in searching for items in, and holding her purse.    Baseline Pt. is unable to perform    Time 12    Period Weeks    Status On-going    Target Date 08/30/20      OT LONG TERM GOAL  #10   TITLE Pt. will use her right hand to assist the left to independently place eye glasses into a case.    Baseline Pt. has difficulty perfroming.    Time 12    Period Weeks    Status On-going    Target Date 08/30/20                 Plan - 07/03/20 1739     Clinical Impression Statement Pt more consistent with recall of exercises and working towards more active movement of the right UE.  Patient responds well to moist heat prior to exercises and able to move in a greater range of motion.  Improved grasp and release of objects with right hand but still has difficulty with picking up from container or tabletop.  Continue to work towards goals in plan of care to improve ROM, strength and use of right UE in daily activities.    OT Occupational Profile and History Detailed Assessment- Review of Records and additional review of physical, cognitive, psychosocial history related to current functional performance    Occupational performance deficits (Please refer to evaluation for details): ADL's;IADL's;Rest and Sleep;Work;Play;Leisure    Body Structure / Function / Physical Skills ADL;Balance;Coordination;Decreased knowledge of precautions;Decreased knowledge of use of DME;Dexterity;Edema;Mobility;Tone;Strength;IADL;Sensation;GMC;Gait;ROM;FMC;Flexibility;Pain;Vision;UE functional use;Endurance    Cognitive Skills Attention;Perception;Problem Solve;Safety Awareness;Thought  Rehab Potential Good    Clinical Decision Making Several treatment options, min-mod task modification necessary    Comorbidities Affecting Occupational Performance: None    Modification or Assistance to Complete Evaluation  Min-Moderate modification of tasks or assist with assess necessary to complete eval    OT Frequency 3x / week    OT Duration 12 weeks    OT Treatment/Interventions Self-care/ADL training;Ultrasound;Energy conservation;Visual/perceptual remediation/compensation;Patient/family education;DME and/or AE instruction;Aquatic Therapy;Paraffin;Gait Training;Passive range of motion;Balance training;Fluidtherapy;Electrical Stimulation;Functional Mobility Training;Splinting;Moist Heat;Therapeutic exercise;Manual Therapy;Cognitive remediation/compensation;Manual lymph  drainage;Neuromuscular education;Coping strategies training    Consulted and Agree with Plan of Care Patient;Family member/caregiver           Patient will benefit from skilled therapeutic intervention in order to improve the following deficits and impairments:   Body Structure / Function / Physical Skills: ADL,Balance,Coordination,Decreased knowledge of precautions,Decreased knowledge of use of DME,Dexterity,Edema,Mobility,Tone,Strength,IADL,Sensation,GMC,Gait,ROM,FMC,Flexibility,Pain,Vision,UE functional use,Endurance Cognitive Skills: Attention,Perception,Problem Solve,Safety Awareness,Thought     Visit Diagnosis: Muscle weakness (generalized)  Other lack of coordination  Cognitive communication deficit  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 Dyspareunia in female 04/05/2020  . History of abnormal cervical Pap smear 03/10/2020  . GIB (gastrointestinal bleeding) 02/28/2020  . Elevated BUN   . Prediabetes   . 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 Cerebral vasospasm   . Sinus tachycardia   . Dysphagia, post-stroke   . Thrombocytopenia ([place])   . Acute blood loss anemia   . Brain aneurysm   . ICH (intracerebral hemorrhage) ([place]) 01/05/2020  . History of adenomatous polyp of colon 02/07/2016  . 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 Fissure in ano 11/11/2014  . Hemorrhoids 11/11/2014  . Constipation 11/19/2013  . GERD (gastroesophageal reflux disease) 03/24/2013  . Irritable bowel syndrome with diarrhea 03/24/2013  . Herpes zoster 05/14/2005  . Abnormal Pap smear of cervix 05/15/1983  . History of cervical dysplasia 05/15/1983   Achilles Dunk, OTR/L, CLT  Javonn Gauger 07/03/2020, 5:55 PM  [place] MAIN [facility] SERVICES [address] [place], [place], [address] Phone: [phone]    Fax:  [phone]  Name: Erika Stanton MRN: [id number] Date of Birth: 10-31-1955

## 2020-07-04 ENCOUNTER — Ambulatory Visit: Payer: Self-pay

## 2020-07-04 ENCOUNTER — Encounter: Payer: Self-pay | Admitting: Speech Pathology

## 2020-07-05 ENCOUNTER — Ambulatory Visit: Payer: BC Managed Care – PPO | Admitting: Speech Pathology

## 2020-07-05 ENCOUNTER — Encounter: Payer: Self-pay | Admitting: Occupational Therapy

## 2020-07-05 ENCOUNTER — Ambulatory Visit: Payer: BC Managed Care – PPO | Admitting: Occupational Therapy

## 2020-07-05 ENCOUNTER — Other Ambulatory Visit: Payer: Self-pay

## 2020-07-05 DIAGNOSIS — I69159 Hemiplegia and hemiparesis following nontraumatic intracerebral hemorrhage affecting unspecified side: Secondary | ICD-10-CM

## 2020-07-05 DIAGNOSIS — R41841 Cognitive communication deficit: Secondary | ICD-10-CM

## 2020-07-05 DIAGNOSIS — I607 Nontraumatic subarachnoid hemorrhage from unspecified intracranial artery: Secondary | ICD-10-CM

## 2020-07-05 DIAGNOSIS — R278 Other lack of coordination: Secondary | ICD-10-CM

## 2020-07-05 DIAGNOSIS — M6281 Muscle weakness (generalized): Secondary | ICD-10-CM

## 2020-07-05 NOTE — Therapy (Signed)
[place] MAIN [facility] SERVICES [address] [place], [place], [address] Phone: [phone]   Fax:  [phone]  Speech Language Pathology Treatment and Discharge Summary  Patient Details  Name: Erika Stanton MRN: [id number] Date of Birth: Sep 15, 1955 Referring Provider (SLP): Alger Simons, MD   Encounter Date: 07/05/2020   End of Session - 07/05/20 1532    Visit Number 28    Number of Visits 33    Date for SLP Re-Evaluation 07/09/20    Authorization - Visit Number 8    Progress Note Due on Visit 10    SLP Start Time 2924    SLP Stop Time  1500    SLP Time Calculation (min) 57 min    Activity Tolerance Patient tolerated treatment well           Past Medical History:  Diagnosis Date  . Aneurysm ([place])   . Melena     Past Surgical History:  Procedure Laterality Date  . CHOLECYSTECTOMY    . COLONOSCOPY  11/2017   at [facility]. no recurrent polyps.  suggest repeat surveillance study 11/2022.    Marland Kitchen COLONOSCOPY W/ POLYPECTOMY  06/2014   Dr Roxy Manns at [facility].  3 adenomatoous polyps, anal fissure.    . ESOPHAGOGASTRODUODENOSCOPY (EGD) WITH PROPOFOL N/A 01/27/2020   Procedure: ESOPHAGOGASTRODUODENOSCOPY (EGD) WITH PROPOFOL;  Surgeon: Mauri Pole, MD;  Location: [place] ENDOSCOPY;  Service: Endoscopy;  Laterality: N/A;  . IR 3D INDEPENDENT WKST  01/06/2020  . IR ANGIO INTRA EXTRACRAN SEL INTERNAL CAROTID BILAT MOD SED  01/06/2020  . IR ANGIO VERTEBRAL SEL VERTEBRAL UNI L MOD SED  01/06/2020  . IR ANGIOGRAM FOLLOW UP STUDY  01/06/2020  . IR ANGIOGRAM FOLLOW UP STUDY  01/06/2020  . IR ANGIOGRAM FOLLOW UP STUDY  01/06/2020  . IR ANGIOGRAM FOLLOW UP STUDY  01/06/2020  . IR ANGIOGRAM FOLLOW UP STUDY  01/06/2020  . IR ANGIOGRAM FOLLOW UP STUDY  01/06/2020  . IR ANGIOGRAM FOLLOW UP STUDY  01/06/2020  . IR ANGIOGRAM FOLLOW UP STUDY  01/06/2020  . IR ANGIOGRAM FOLLOW UP STUDY  01/06/2020  . IR ANGIOGRAM FOLLOW UP STUDY  01/06/2020  . IR NEURO EACH ADD'L AFTER  BASIC UNI RIGHT (MS)  01/06/2020  . IR TRANSCATH/EMBOLIZ  01/06/2020  . RADIOLOGY WITH ANESTHESIA N/A 01/06/2020   Procedure: IR WITH ANESTHESIA FOR ANEURYSM;  Surgeon: Consuella Lose, MD;  Location: [place];  Service: Radiology;  Laterality: N/A;  . TUBAL LIGATION      There were no vitals filed for this visit.          ADULT SLP TREATMENT - 07/05/20 1516      General Information   Behavior/Cognition Alert;Cooperative    HPI Pt presented with headache and lt hemiplegia on 01-05-20. 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 Stent and embolization of rt ICA on 01-06-20. Pt rec'd ST on CIR for cognition and swallowing and was d/c'd on dys III/thin with supervision level cues for swallow precautions. Erika Stanton was d/c'd 02-23-20.      Treatment Provided   Treatment provided Cognitive-Linquistic      Pain Assessment   Pain Assessment No/denies pain      Cognitive-Linquistic Treatment   Treatment focused on Cognition;Patient/family/caregiver education    Skilled Treatment Patient brought folder and calendar from home. Husband  also present for today's session. Erika Stanton continues to follow daily schedule and complete events on her calendar. She required min-mod cues for problem solving, anticipatory awareness to ID how she might use her phone (alarm or [name]) for prospective memory/follow-through with a task. Husband had questions for SLP re: motivation and how to keep pt engaged in doing cognitive activities. Answered pt's and husband's questions and identified functional tasks for pt to continue working on her cognitive rehabilitation at home (see pt instructions).  Also reviewed handout with cognitive apps for tablet/phone. Reviewed benefits of neuropsychology and provided list of practitioners as both pt and spouse endorse  difficulties with adjustment to pt's neurological changes. Reinforced importance of routine and schedule to increase pt independence, as well as preplanning and communicating about the schedule each day.      Assessment / Recommendations / Plan   Plan Discharge SLP treatment due to (comment)      Progression Toward Goals   Progression toward goals Goals met, education completed, patient discharged from [place] [facility] - 07/05/20 1538    Education Details functional cognitive activities, neuropsychology follow-up    Person(s) Educated Patient;Spouse    Methods Explanation;Handout    Comprehension Verbalized understanding            SLP Short Term Goals - 07/05/20 1530      SLP SHORT TERM GOAL #1   Title pt to complete cognitive linguistic testing    Period --   or 4 total sessions   Status Achieved      SLP SHORT TERM GOAL #2   Title pt will demo emergent awareness 100% with min complex therapy tasks x3 visits    Baseline 03-30-20;    Status Partially Met      SLP SHORT TERM GOAL #3   Title pt will demo appriopriate aspiration precuations with suggested POs x3 sessions    Status Deferred      SLP SHORT TERM GOAL #4   Title pt will solve household probelms using functional solutions x3 sessions    Baseline 03-30-20; 04-11-20    Status Achieved      SLP SHORT TERM GOAL #5   Title pt will demo selective attention (internal distraction) to a simple functional task for 10 minutes with rare min A back to task in 3 sessions    Baseline 03-28-20    Period Weeks    Status Achieved      SLP SHORT TERM GOAL #6   Title Patient will preplan daily schedule 5/7 days.    Time 4    Period Weeks    Status New            SLP Long Term Goals - 07/05/20 1531      SLP LONG TERM GOAL #1   Title pt will demo anticipatory awareness by double checking all work with a non-verbal cue x3 sessions    Time 8    Period Weeks   or 17 total sessions, for all LTGs   Status  Partially Met      SLP LONG TERM GOAL #2   Title pt will follow swallow precautions with POs with modified independence x3 sessions    Status Deferred      SLP LONG TERM GOAL #3   Title pt will demo [facility] skills in solving min-mod complex problems in WNL amount of time with modified independence (double checking answers , etc)  Time 8    Period Weeks    Status Achieved      SLP LONG TERM GOAL #4   Title pt will demo simple alternating attention in functional cognitive linguistic tasks in 3 sessions    Time 8    Period Weeks    Status Achieved      SLP LONG TERM GOAL #5   Title Patient will use schedule to complete 3 pre-planned tasks with min cues from spouse.    Time 8    Period Weeks    Status Achieved            Plan - 07/05/20 1538    Clinical Impression Statement Spouse present with pt for planned d/c today. Spouse expressed concern that without ST visits pt will not continue with therapeutic activities at home. Anticipate pt will require some level of cuing or structure in the form of routine to be consistent with planning and completing activities; spouse appears willing to assist pt with this. Pt and spouse both expressed difficulty adjusting to neurological changes, and noting reduced motivation at home. Encouraged follow-up with Dr. Jefm Miles, who pt saw during [place] stay, or other neuropsychologist, and encouraged pt/spouse to discuss during rehab follow-up appointment next month. Overall patient has reduced impulsivity and has progressed with selective and alternating attention, continues to require cues for attention to detail, divergent attention, anticipatory awareness. Initiation/motivation are improved with use of schedule and routine but remain challenging for patient. Patient and spouse demonstrate understanding of activities to support pt's ongoing recovery, and are in agreement with d/c from ST at this time.    Speech Therapy Frequency --   d/c   Duration --   d/c    Treatment/Interventions Aspiration precaution training;Pharyngeal strengthening exercises;Compensatory techniques;Diet toleration management by SLP;Trials of upgraded texture/liquids;Cueing hierarchy;Cognitive reorganization;Internal/external aids;Patient/family education;SLP instruction and feedback;Functional tasks    Potential to Achieve Goals Good    Consulted and Agree with Plan of Care Patient;Family member/caregiver           Patient will benefit from skilled therapeutic intervention in order to improve the following deficits and impairments:   Cognitive communication deficit  Cerebral aneurysm rupture [facility])    Problem List Patient Active Problem List   Diagnosis Date Noted  . Dyspareunia in female 04/05/2020  . History of abnormal cervical Pap smear 03/10/2020  . GIB (gastrointestinal bleeding) 02/28/2020  . Elevated BUN   . Prediabetes   . 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[place]) 01/20/2020  . Cerebral vasospasm   . Sinus tachycardia   . Dysphagia, post-stroke   . Thrombocytopenia ([place])   . Acute blood loss anemia   . Brain aneurysm   . ICH (intracerebral hemorrhage) ([place]) 01/05/2020  . History of adenomatous polyp of colon 02/07/2016  . 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 Fissure in ano 11/11/2014  . Hemorrhoids 11/11/2014  . Constipation 11/19/2013  . GERD (gastroesophageal reflux disease) 03/24/2013  . Irritable bowel syndrome with diarrhea 03/24/2013  . Herpes zoster 05/14/2005  . Abnormal Pap smear of cervix 05/15/1983  . History of cervical dysplasia 05/15/1983   SPEECH THERAPY DISCHARGE SUMMARY  Visits from Start of Care: 28  Current functional level related to goals / functional outcomes: Pt met 3/5 LTGs, partially met 1/5 LTGs (1 LTG deferred).    Remaining deficits: Mild cognitive communication deficit with impairments in higher level attention, executive function.    Education / Equipment: Neuropsychology, cognitive activities for  home Plan: Patient agrees to discharge.  Patient goals were  partially met.                                                  the patient's request.  ?????         [name], [place], CCC-SLP Speech-Language Pathologist   Aliene Altes 07/05/2020, 3:50 PM  [place] MAIN [facility] SERVICES [address] [place], [place], [address] Phone: [phone]   Fax:  [phone]   Name: Erika Stanton MRN: [id number] Date of Birth: 05/24/1955

## 2020-07-05 NOTE — Patient Instructions (Signed)
Neuropsychology (through [facility]):  [name] (the doctor you saw before you were discharged on Rehab)  [name]  Neurologist:  Dr. Jennings Books at [place]:  Keep using your monthly planner and calendar. Set and keep your routine for meal planning, other recurring tasks.  Ask [name] to remind you to do tasks at a certain time  - Make a list of questions and topics to discuss with your doctor prior to your appointment -[name], Take notes after your appointment. Summarize what the doctor told you (med changes, surgery details, referrals, etc). Make any notes about further actions or follow up.  -Budget for and set a consistent time to do some creative activity every day for your brain (art, games, reading)  [name], [place], [email or web address]

## 2020-07-06 ENCOUNTER — Other Ambulatory Visit: Payer: Self-pay | Admitting: Physical Medicine and Rehabilitation

## 2020-07-06 ENCOUNTER — Ambulatory Visit: Payer: Self-pay

## 2020-07-06 ENCOUNTER — Encounter: Payer: Self-pay | Admitting: Occupational Therapy

## 2020-07-06 ENCOUNTER — Ambulatory Visit: Payer: BC Managed Care – PPO | Admitting: Occupational Therapy

## 2020-07-06 ENCOUNTER — Other Ambulatory Visit: Payer: Self-pay

## 2020-07-06 DIAGNOSIS — M6281 Muscle weakness (generalized): Secondary | ICD-10-CM

## 2020-07-06 DIAGNOSIS — R41841 Cognitive communication deficit: Secondary | ICD-10-CM | POA: Diagnosis not present

## 2020-07-06 DIAGNOSIS — R278 Other lack of coordination: Secondary | ICD-10-CM

## 2020-07-06 MED ORDER — GABAPENTIN 300 MG PO CAPS
300.0000 mg | ORAL_CAPSULE | Freq: Three times a day (TID) | ORAL | 1 refills | Status: DC
Start: 2020-07-06 — End: 2020-07-06

## 2020-07-06 MED ORDER — GABAPENTIN 300 MG PO CAPS
300.0000 mg | ORAL_CAPSULE | Freq: Three times a day (TID) | ORAL | 1 refills | Status: DC
Start: 2020-07-06 — End: 2020-07-26

## 2020-07-06 NOTE — Progress Notes (Signed)
Can you please schedule her with Dr. Sima Matas for neuropsych follow-up?

## 2020-07-06 NOTE — Therapy (Signed)
[place] MAIN [facility] SERVICES [address] [place], [place], [address] Phone: [phone]   Fax:  [phone]  Occupational Therapy Treatment  Patient Details  Name: Erika Stanton MRN: [id number] Date of Birth: 1956/04/21 Referring Provider (OT): Dr. Naaman Plummer   Encounter Date: 07/06/2020   OT End of Session - 07/06/20 1127    Visit Number 42    Number of Visits 60    Date for OT Re-Evaluation 08/30/20    Authorization Type Progress report period starting 06/29/2020    OT Start Time 1115    OT Stop Time 1200    OT Time Calculation (min) 45 min    Activity Tolerance Patient tolerated treatment well    Behavior During Therapy [facility] for tasks assessed/performed           Past Medical History:  Diagnosis Date  . Aneurysm ([place])   . Melena     Past Surgical History:  Procedure Laterality Date  . CHOLECYSTECTOMY    . COLONOSCOPY  11/2017   at [facility]. no recurrent polyps.  suggest repeat surveillance study 11/2022.    Marland Kitchen COLONOSCOPY W/ POLYPECTOMY  06/2014   Dr Roxy Manns at [facility].  3 adenomatoous polyps, anal fissure.    . ESOPHAGOGASTRODUODENOSCOPY (EGD) WITH PROPOFOL N/A 01/27/2020   Procedure: ESOPHAGOGASTRODUODENOSCOPY (EGD) WITH PROPOFOL;  Surgeon: Mauri Pole, MD;  Location: [place] ENDOSCOPY;  Service: Endoscopy;  Laterality: N/A;  . IR 3D INDEPENDENT WKST  01/06/2020  . IR ANGIO INTRA EXTRACRAN SEL INTERNAL CAROTID BILAT MOD SED  01/06/2020  . IR ANGIO VERTEBRAL SEL VERTEBRAL UNI L MOD SED  01/06/2020  . IR ANGIOGRAM FOLLOW UP STUDY  01/06/2020  . IR ANGIOGRAM FOLLOW UP STUDY  01/06/2020  . IR ANGIOGRAM FOLLOW UP STUDY  01/06/2020  . IR ANGIOGRAM FOLLOW UP STUDY  01/06/2020  . IR ANGIOGRAM FOLLOW UP STUDY  01/06/2020  . IR ANGIOGRAM FOLLOW UP STUDY  01/06/2020  . IR ANGIOGRAM FOLLOW UP STUDY  01/06/2020  . IR ANGIOGRAM FOLLOW UP STUDY  01/06/2020  . IR ANGIOGRAM FOLLOW UP STUDY  01/06/2020  . IR ANGIOGRAM FOLLOW UP STUDY  01/06/2020  . IR NEURO  EACH ADD'L AFTER BASIC UNI RIGHT (MS)  01/06/2020  . IR TRANSCATH/EMBOLIZ  01/06/2020  . RADIOLOGY WITH ANESTHESIA N/A 01/06/2020   Procedure: IR WITH ANESTHESIA FOR ANEURYSM;  Surgeon: Consuella Lose, MD;  Location: [place];  Service: Radiology;  Laterality: N/A;  . TUBAL LIGATION      There were no vitals filed for this visit.   Subjective Assessment - 07/06/20 1126    Subjective  Pt. reports doing well today.    Patient is accompanied by: --    Pertinent History ICH 01/06/20, Aneurysm. PMH: OA bilateral knees and hands    Currently in Pain? Yes    Pain Score 2     Pain Location Arm    Pain Orientation Right    Pain Descriptors / Indicators Aching    Pain Type Chronic pain           OT TREATMENT   Therapeutic Exercise:  Pt. was able to tolerate AAROM for elbow flexion, elbow extension with resistance, AAROM supination with PROM.PROM for right wrist, digit MP, PIP, and DIP flexion, and extension, AROM digit MP, PIP, and DIP flexion, and extensionin preparation for grasping objects.Education was provided about opportunities to engage her hand more at home.  Neuromuscular re-ed:  Pt. worked on [employer], and reaching to release  them over a cone target placed at the tabletop. Emphasis was placed on releasing with digit extension, and thumb IP extension. Pt.required assist, and support proximally. Pt. Worked on holding a small medication bottle with her right hand while opening it with her right hand. Pt then attempted to open the lid with her right hand engaging her thumb, and 2nd digit to unscrew the top without resistance.  Pt. reports that the surgery for her Aneurysm has been scheduled for March 11th. Pt. continues to try to engage her right UE during tasks at home. Pt. continues to do the home exercises with her husband.Pt.continues to work on improving RUE ROM, and functional reaching in preparation for hand to face patterns for self-care tasks. Pt.  Continues to work on improving RUE ROM, and functional hand use during ADL tasks.                       OT Education - 07/06/20 1127    Education Details ROM, pain control, strength, finger ROM, functional tasks at home    Person(s) Educated Patient    Methods Explanation;Demonstration;Verbal cues;Handout    Comprehension Verbalized understanding;Returned demonstration;Verbal cues required            OT Short Term Goals - 06/29/20 1217      OT SHORT TERM GOAL #4   Title Pt will demonstrate 30 A/ROM shoulder flexion for RUE with pain less than or equal to 4/10.    Baseline Pt. presents with limited isolated  right shoulder flexion with pain continues to be 2    Time 4    Period Weeks    Status Deferred      OT SHORT TERM GOAL #7   Title Pt will demonstrate at least 25% finger flexion/ extension in RUE in prep for functional use.    Baseline Limited PROM, and active digit flexion, and extension. Improved AROM for right 2nd, and 3rd digits. significant limitations with the 4th, and 5th digits.    Time 4    Period Weeks    Status Deferred             OT Long Term Goals - 06/29/20 1221      OT LONG TERM GOAL #1   Title Pt will perform  all basic ADLs with supervision    Baseline Pt. requires minA from her husband    Time 12    Period Weeks    Status On-going    Target Date 08/30/20      OT LONG TERM GOAL #2   Title Pt will demonstrate 45* RUE shoulder flexion in prep for functional reach with pain less than or equal to 3/10.    Baseline Pt. conitnues to present with limited AROM for isolated  right shoulder flexion with pain 2.    Time 12    Period Weeks    Status Partially Met    Target Date 08/30/20      OT LONG TERM GOAL #3   Title Pt will use RUE as a stabilizer/ gross A at least 30 % of the time for ADLs/ IADLs.    Baseline Pt. is using her right hand as a gross stabilizer 15% of the time.    Time 12    Period Weeks    Status On-going     Target Date 08/30/20      OT LONG TERM GOAL #4   Title Pt will increase LUE grip strength to 25 lbs or  greater for increased ease with ADLs.    Baseline Pt. has improved with left grip strength. Pt.continues to work towards 25#.    Time 12    Period Weeks    Status On-going    Target Date 08/30/20      OT LONG TERM GOAL #5   Title Pt will perform simple home management/ snack prep with min A.    Baseline Pt. conitnues to try to help when she can, however her husband mostly perfroms these tasks.    Time 12    Period Weeks    Status On-going    Target Date 08/30/20      OT LONG TERM GOAL #6   Title Pt will demonstrate ability to grasp/ release a cup 2/3 trials with RUE.    Baseline Pt. is initialing grasp, and active release of objects, however is unable to grasp/release a cup.    Time 12    Period Weeks    Status On-going    Target Date 08/30/20      OT LONG TERM GOAL #8   Title I with updated HEP.    Baseline Pt. requires assist, and cuing for HEP.    Time 12    Period Weeks    Status On-going    Target Date 08/30/20      OT LONG TERM GOAL  #9   TITLE Pt. will use her right hand to assist the left hand in searching for items in, and holding her purse.    Baseline Pt. is unable to perform    Time 12    Period Weeks    Status On-going    Target Date 08/30/20      OT LONG TERM GOAL  #10   TITLE Pt. will use her right hand to assist the left to independently place eye glasses into a case.    Baseline Pt. has difficulty perfroming.    Time 12    Period Weeks    Status On-going    Target Date 08/30/20                 Plan - 07/06/20 1128    Clinical Impression Statement Pt. reports that the surgery for her Aneurysm has been scheduled for March 11th. Pt. continues to try to engage her right UE during tasks at home. Pt. continues to do the home exercises with her husband.Pt.continues to work on improving RUE ROM, and functional reaching in preparation for hand to  face patterns for self-care tasks. Pt. Continues to work on improving RUE ROM, and functional hand use during ADL tasks.   OT Occupational Profile and History Detailed Assessment- Review of Records and additional review of physical, cognitive, psychosocial history related to current functional performance    Occupational performance deficits (Please refer to evaluation for details): ADL's;IADL's;Rest and Sleep;Work;Play;Leisure    Body Structure / Function / Physical Skills ADL;Balance;Coordination;Decreased knowledge of precautions;Decreased knowledge of use of DME;Dexterity;Edema;Mobility;Tone;Strength;IADL;Sensation;GMC;Gait;ROM;FMC;Flexibility;Pain;Vision;UE functional use;Endurance    Cognitive Skills Attention;Perception;Problem Solve;Safety Awareness;Thought    Rehab Potential Good    Clinical Decision Making Several treatment options, min-mod task modification necessary    Comorbidities Affecting Occupational Performance: None    Modification or Assistance to Complete Evaluation  Min-Moderate modification of tasks or assist with assess necessary to complete eval    OT Frequency 3x / week    OT Duration 12 weeks    OT Treatment/Interventions Self-care/ADL training;Ultrasound;Energy conservation;Visual/perceptual remediation/compensation;Patient/family education;DME and/or AE instruction;Aquatic Therapy;Paraffin;Gait Training;Passive range of motion;Balance training;Fluidtherapy;Electrical Stimulation;Functional  Mobility Training;Splinting;Moist Heat;Therapeutic exercise;Manual Therapy;Cognitive remediation/compensation;Manual lymph drainage;Neuromuscular education;Coping strategies training    Consulted and Agree with Plan of Care Patient;Family member/caregiver           Patient will benefit from skilled therapeutic intervention in order to improve the following deficits and impairments:   Body Structure / Function / Physical Skills: ADL,Balance,Coordination,Decreased knowledge of  precautions,Decreased knowledge of use of DME,Dexterity,Edema,Mobility,Tone,Strength,IADL,Sensation,GMC,Gait,ROM,FMC,Flexibility,Pain,Vision,UE functional use,Endurance Cognitive Skills: Attention,Perception,Problem Solve,Safety Awareness,Thought     Visit Diagnosis: Muscle weakness (generalized)  Other lack of coordination    Problem List Patient Active Problem List   Diagnosis Date Noted  . Dyspareunia in female 04/05/2020  . History of abnormal cervical Pap smear 03/10/2020  . GIB (gastrointestinal bleeding) 02/28/2020  . Elevated BUN   . Prediabetes   . 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[place]) 01/20/2020  . Cerebral vasospasm   . Sinus tachycardia   . Dysphagia, post-stroke   . Thrombocytopenia ([place])   . Acute blood loss anemia   . Brain aneurysm   . ICH (intracerebral hemorrhage) ([place]) 01/05/2020  . History of adenomatous polyp of colon 02/07/2016  . 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 Fissure in ano 11/11/2014  . Hemorrhoids 11/11/2014  . Constipation 11/19/2013  . GERD (gastroesophageal reflux disease) 03/24/2013  . Irritable bowel syndrome with diarrhea 03/24/2013  . Herpes zoster 05/14/2005  . Abnormal Pap smear of cervix 05/15/1983  . History of cervical dysplasia 05/15/1983    Harrel Carina, MS, OTR/L 07/06/2020, 2:34 PM  [place] MAIN [facility] SERVICES [address] [place], [place], [address] Phone: [phone]   Fax:  [phone]  Name: Erika Stanton MRN: [id number] Date of Birth: December 03, 1955

## 2020-07-06 NOTE — Therapy (Signed)
[place] MAIN [facility] SERVICES [address] [place], [place], [address] Phone: [phone]   Fax:  [phone]  Occupational Therapy Treatment  Patient Details  Name: FRIEDA ARNALL MRN: [id number] Date of Birth: [birth year] Referring Provider (OT): Dr. Naaman Plummer   Encounter Date: 07/05/2020   OT End of Session - 07/05/20 1312    Visit Number 41    Number of Visits 21    Date for OT Re-Evaluation 08/30/20    Authorization Type Progress report period starting 06/29/2020    Authorization Time Period State BCBS    OT Start Time 1300    OT Stop Time 1353    OT Time Calculation (min) 53 min    Activity Tolerance Patient tolerated treatment well    Behavior During Therapy [facility] for tasks assessed/performed           Past Medical History:  Diagnosis Date  . Aneurysm ([place])   . Melena     Past Surgical History:  Procedure Laterality Date  . CHOLECYSTECTOMY    . COLONOSCOPY  11/2017   at [facility]. no recurrent polyps.  suggest repeat surveillance study 11/2022.    Marland Kitchen COLONOSCOPY W/ POLYPECTOMY  06/2014   Dr Roxy Manns at [facility].  3 adenomatoous polyps, anal fissure.    . ESOPHAGOGASTRODUODENOSCOPY (EGD) WITH PROPOFOL N/A 01/27/2020   Procedure: ESOPHAGOGASTRODUODENOSCOPY (EGD) WITH PROPOFOL;  Surgeon: Mauri Pole, MD;  Location: [place] ENDOSCOPY;  Service: Endoscopy;  Laterality: N/A;  . IR 3D INDEPENDENT WKST  01/06/2020  . IR ANGIO INTRA EXTRACRAN SEL INTERNAL CAROTID BILAT MOD SED  01/06/2020  . IR ANGIO VERTEBRAL SEL VERTEBRAL UNI L MOD SED  01/06/2020  . IR ANGIOGRAM FOLLOW UP STUDY  01/06/2020  . IR ANGIOGRAM FOLLOW UP STUDY  01/06/2020  . IR ANGIOGRAM FOLLOW UP STUDY  01/06/2020  . IR ANGIOGRAM FOLLOW UP STUDY  01/06/2020  . IR ANGIOGRAM FOLLOW UP STUDY  01/06/2020  . IR ANGIOGRAM FOLLOW UP STUDY  01/06/2020  . IR ANGIOGRAM FOLLOW UP STUDY  01/06/2020  . IR ANGIOGRAM FOLLOW UP STUDY  01/06/2020  . IR ANGIOGRAM FOLLOW UP STUDY  01/06/2020  . IR  ANGIOGRAM FOLLOW UP STUDY  01/06/2020  . IR NEURO EACH ADD'L AFTER BASIC UNI RIGHT (MS)  01/06/2020  . IR TRANSCATH/EMBOLIZ  01/06/2020  . RADIOLOGY WITH ANESTHESIA N/A 01/06/2020   Procedure: IR WITH ANESTHESIA FOR ANEURYSM;  Surgeon: Consuella Lose, MD;  Location: [place];  Service: Radiology;  Laterality: N/A;  . TUBAL LIGATION      There were no vitals filed for this visit.   Subjective Assessment - 07/05/20 1310    Subjective  Pt reports right hand and arm about the same, pain 1/10.  Has been working on grasp and release, moving shoulder.    Pertinent History ICH 01/06/20, Aneurysm. PMH: OA bilateral knees and hands    Patient Stated Goals Pt would like to be as independent as possible.    Currently in Pain? Yes    Pain Score 1     Pain Location Arm    Pain Orientation Right    Pain Descriptors / Indicators Aching    Pain Type Chronic pain    Pain Onset More than a month ago    Pain Frequency Intermittent          Pt reports her surgery for her brain will be March 11 and she will need to stay overnight.  She is anticipating to be able to return  to therapy the following week.  Will need a new order to continue.    Moist heat to right shoulder and hand prior to therapeutic exercise for 5 mins, skin inspected prior to and after with no issues.   Shoulder shrugs, retraction and shoulder rolls active assist with therapist and followed by AROM for each exercise with cues as needed.   Pt seen for PROM of RUE, all joints and available planes of motion, followed by AAROM with forward reaching, ABD, elbow flexion/extension, supination of forearm, wrist flexion/extension, digit extension with prolonged stretch.  Patient performing forward reach between legs towards feet.    Grasp and release of variety of size and shaped objects.  Difficulty with larger objects such as hand sanitizer bottle, assist at thumb and small finger for release.  Multiple trials completed.    Continued to  encourage pt with hand to mouth patterns and discussed engaging right hand into daily tasks as much as possible.    Encouraged stretch to digits for extension and turn to pronation to rest hand on leg for periods to promote extension especially at the small finger.    Response to tx:   Patient with upcoming surgery soon for brain aneurysm with hospitalization.  Patient plans to return to therapy within the next week but will need a new order to continue.  While pt reports she feels things are the same and cannot see progress, she admits she sees progress with select tasks as therapist points out progress from beginning to now.  She reports she has a hard time feeling like her arm should be "normal" and anything less than that is hard to recognize.  Continue to work towards goals in plan of care and encourage patient to continue to incorporate RUE into daily routine and tasks.                   OT Education - 07/05/20 1311    Education Details ROM, pain control, strength, finger ROM, functional tasks at home    Person(s) Educated Patient    Methods Explanation;Demonstration;Verbal cues;Handout    Comprehension Verbalized understanding;Returned demonstration;Verbal cues required            OT Short Term Goals - 06/29/20 1217      OT SHORT TERM GOAL #4   Title Pt will demonstrate 30 A/ROM shoulder flexion for RUE with pain less than or equal to 4/10.    Baseline Pt. presents with limited isolated  right shoulder flexion with pain continues to be 2    Time 4    Period Weeks    Status Deferred      OT SHORT TERM GOAL #7   Title Pt will demonstrate at least 25% finger flexion/ extension in RUE in prep for functional use.    Baseline Limited PROM, and active digit flexion, and extension. Improved AROM for right 2nd, and 3rd digits. significant limitations with the 4th, and 5th digits.    Time 4    Period Weeks    Status Deferred             OT Long Term Goals - 06/29/20  1221      OT LONG TERM GOAL #1   Title Pt will perform  all basic ADLs with supervision    Baseline Pt. requires minA from her husband    Time 12    Period Weeks    Status On-going    Target Date 08/30/20      OT  LONG TERM GOAL #2   Title Pt will demonstrate 45* RUE shoulder flexion in prep for functional reach with pain less than or equal to 3/10.    Baseline Pt. conitnues to present with limited AROM for isolated  right shoulder flexion with pain 2.    Time 12    Period Weeks    Status Partially Met    Target Date 08/30/20      OT LONG TERM GOAL #3   Title Pt will use RUE as a stabilizer/ gross A at least 30 % of the time for ADLs/ IADLs.    Baseline Pt. is using her right hand as a gross stabilizer 15% of the time.    Time 12    Period Weeks    Status On-going    Target Date 08/30/20      OT LONG TERM GOAL #4   Title Pt will increase LUE grip strength to 25 lbs or greater for increased ease with ADLs.    Baseline Pt. has improved with left grip strength. Pt.continues to work towards 25#.    Time 12    Period Weeks    Status On-going    Target Date 08/30/20      OT LONG TERM GOAL #5   Title Pt will perform simple home management/ snack prep with min A.    Baseline Pt. conitnues to try to help when she can, however her husband mostly perfroms these tasks.    Time 12    Period Weeks    Status On-going    Target Date 08/30/20      OT LONG TERM GOAL #6   Title Pt will demonstrate ability to grasp/ release a cup 2/3 trials with RUE.    Baseline Pt. is initialing grasp, and active release of objects, however is unable to grasp/release a cup.    Time 12    Period Weeks    Status On-going    Target Date 08/30/20      OT LONG TERM GOAL #8   Title I with updated HEP.    Baseline Pt. requires assist, and cuing for HEP.    Time 12    Period Weeks    Status On-going    Target Date 08/30/20      OT LONG TERM GOAL  #9   TITLE Pt. will use her right hand to assist the  left hand in searching for items in, and holding her purse.    Baseline Pt. is unable to perform    Time 12    Period Weeks    Status On-going    Target Date 08/30/20      OT LONG TERM GOAL  #10   TITLE Pt. will use her right hand to assist the left to independently place eye glasses into a case.    Baseline Pt. has difficulty perfroming.    Time 12    Period Weeks    Status On-going    Target Date 08/30/20                 Plan - 07/05/20 1317    Clinical Impression Statement Patient with upcoming surgery soon for brain aneurysm with hospitalization.  Patient plans to return to therapy within the next week but will need a new order to continue.  While pt reports she feels things are the same and cannot see progress, she admits she sees progress with select tasks as therapist points out progress from beginning to now.  She reports she has a hard time feeling like her arm should be "normal" and anything less than that is hard to recognize.  Continue to work towards goals in plan of care and encourage patient to continue to incorporate RUE into daily routine and tasks.    OT Occupational Profile and History Detailed Assessment- Review of Records and additional review of physical, cognitive, psychosocial history related to current functional performance    Occupational performance deficits (Please refer to evaluation for details): ADL's;IADL's;Rest and Sleep;Work;Play;Leisure    Body Structure / Function / Physical Skills ADL;Balance;Coordination;Decreased knowledge of precautions;Decreased knowledge of use of DME;Dexterity;Edema;Mobility;Tone;Strength;IADL;Sensation;GMC;Gait;ROM;FMC;Flexibility;Pain;Vision;UE functional use;Endurance    Cognitive Skills Attention;Perception;Problem Solve;Safety Awareness;Thought    Rehab Potential Good    Clinical Decision Making Several treatment options, min-mod task modification necessary    Comorbidities Affecting Occupational Performance: None     Modification or Assistance to Complete Evaluation  Min-Moderate modification of tasks or assist with assess necessary to complete eval    OT Frequency 3x / week    OT Duration 12 weeks    OT Treatment/Interventions Self-care/ADL training;Ultrasound;Energy conservation;Visual/perceptual remediation/compensation;Patient/family education;DME and/or AE instruction;Aquatic Therapy;Paraffin;Gait Training;Passive range of motion;Balance training;Fluidtherapy;Electrical Stimulation;Functional Mobility Training;Splinting;Moist Heat;Therapeutic exercise;Manual Therapy;Cognitive remediation/compensation;Manual lymph drainage;Neuromuscular education;Coping strategies training    Consulted and Agree with Plan of Care Patient;Family member/caregiver           Patient will benefit from skilled therapeutic intervention in order to improve the following deficits and impairments:   Body Structure / Function / Physical Skills: ADL,Balance,Coordination,Decreased knowledge of precautions,Decreased knowledge of use of DME,Dexterity,Edema,Mobility,Tone,Strength,IADL,Sensation,GMC,Gait,ROM,FMC,Flexibility,Pain,Vision,UE functional use,Endurance Cognitive Skills: Attention,Perception,Problem Solve,Safety Awareness,Thought     Visit Diagnosis: Muscle weakness (generalized)  Other lack of coordination  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 Dyspareunia in female 04/05/2020  . History of abnormal cervical Pap smear 03/10/2020  . GIB (gastrointestinal bleeding) 02/28/2020  . Elevated BUN   . Prediabetes   . 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 Cerebral vasospasm   . Sinus tachycardia   . Dysphagia, post-stroke   . Thrombocytopenia ([place])   . Acute blood loss anemia   . Brain aneurysm   . ICH (intracerebral hemorrhage) ([place]) 01/05/2020  . History of adenomatous polyp of  colon 02/07/2016  . 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 Fissure in ano 11/11/2014  . Hemorrhoids 11/11/2014  . Constipation 11/19/2013  . GERD (gastroesophageal reflux disease) 03/24/2013  . Irritable bowel syndrome with diarrhea 03/24/2013  . Herpes zoster 05/14/2005  . Abnormal Pap smear of cervix 05/15/1983  . History of cervical dysplasia 05/15/1983   Achilles Dunk, OTR/L, CLT Maanav Kassabian 07/06/2020, 8:31 AM  [place] MAIN [facility] SERVICES [address] [place], [place], [address] Phone: [phone]   Fax:  [phone]  Name: ELAN MCELVAIN MRN: [id number] Date of Birth: 02-21-1956

## 2020-07-07 ENCOUNTER — Other Ambulatory Visit: Payer: Self-pay | Admitting: Physical Medicine and Rehabilitation

## 2020-07-07 DIAGNOSIS — I607 Nontraumatic subarachnoid hemorrhage from unspecified intracranial artery: Secondary | ICD-10-CM

## 2020-07-07 NOTE — Progress Notes (Signed)
I spoke to Dr Sima Matas, he has not seen her in the clinic, if you could please put in a referral we can schedule as new patient for our office. Thank you.

## 2020-07-07 NOTE — Progress Notes (Signed)
I have just placed!

## 2020-07-08 ENCOUNTER — Encounter: Payer: BC Managed Care – PPO | Admitting: Occupational Therapy

## 2020-07-11 ENCOUNTER — Ambulatory Visit: Payer: BC Managed Care – PPO

## 2020-07-12 ENCOUNTER — Encounter: Payer: BC Managed Care – PPO | Admitting: Speech Pathology

## 2020-07-12 ENCOUNTER — Encounter: Payer: BC Managed Care – PPO | Admitting: Occupational Therapy

## 2020-07-14 ENCOUNTER — Encounter: Payer: BC Managed Care – PPO | Admitting: Speech Pathology

## 2020-07-14 ENCOUNTER — Ambulatory Visit: Payer: BC Managed Care – PPO | Admitting: Occupational Therapy

## 2020-07-15 ENCOUNTER — Other Ambulatory Visit: Payer: Self-pay

## 2020-07-15 ENCOUNTER — Encounter: Payer: Self-pay | Admitting: Occupational Therapy

## 2020-07-15 ENCOUNTER — Ambulatory Visit: Payer: BC Managed Care – PPO | Attending: Physical Medicine & Rehabilitation | Admitting: Occupational Therapy

## 2020-07-15 DIAGNOSIS — I69159 Hemiplegia and hemiparesis following nontraumatic intracerebral hemorrhage affecting unspecified side: Secondary | ICD-10-CM | POA: Diagnosis present

## 2020-07-15 DIAGNOSIS — M6281 Muscle weakness (generalized): Secondary | ICD-10-CM | POA: Diagnosis present

## 2020-07-15 DIAGNOSIS — R278 Other lack of coordination: Secondary | ICD-10-CM | POA: Diagnosis present

## 2020-07-15 DIAGNOSIS — I607 Nontraumatic subarachnoid hemorrhage from unspecified intracranial artery: Secondary | ICD-10-CM | POA: Insufficient documentation

## 2020-07-15 NOTE — Therapy (Signed)
[place] MAIN [facility] SERVICES [address] [place], [place], [address] Phone: [phone]   Fax:  [phone]  Occupational Therapy Treatment  Patient Details  Name: Erika Stanton MRN: [id number] Date of Birth: [birth year] Referring Provider (OT): Dr. Naaman Plummer   Encounter Date: 07/15/2020   OT End of Session - 07/15/20 0943    Visit Number 43    Number of Visits 60    Date for OT Re-Evaluation 08/30/20    Authorization Type Progress report period starting 06/29/2020    OT Start Time 0932    OT Stop Time 1015    OT Time Calculation (min) 43 min    Activity Tolerance Patient tolerated treatment well    Behavior During Therapy [facility] for tasks assessed/performed           Past Medical History:  Diagnosis Date  . Aneurysm ([place])   . Melena     Past Surgical History:  Procedure Laterality Date  . CHOLECYSTECTOMY    . COLONOSCOPY  11/2017   at [facility]. no recurrent polyps.  suggest repeat surveillance study 11/2022.    Marland Kitchen COLONOSCOPY W/ POLYPECTOMY  06/2014   Dr Roxy Manns at [facility].  3 adenomatoous polyps, anal fissure.    . ESOPHAGOGASTRODUODENOSCOPY (EGD) WITH PROPOFOL N/A 01/27/2020   Procedure: ESOPHAGOGASTRODUODENOSCOPY (EGD) WITH PROPOFOL;  Surgeon: Mauri Pole, MD;  Location: [place] ENDOSCOPY;  Service: Endoscopy;  Laterality: N/A;  . IR 3D INDEPENDENT WKST  01/06/2020  . IR ANGIO INTRA EXTRACRAN SEL INTERNAL CAROTID BILAT MOD SED  01/06/2020  . IR ANGIO VERTEBRAL SEL VERTEBRAL UNI L MOD SED  01/06/2020  . IR ANGIOGRAM FOLLOW UP STUDY  01/06/2020  . IR ANGIOGRAM FOLLOW UP STUDY  01/06/2020  . IR ANGIOGRAM FOLLOW UP STUDY  01/06/2020  . IR ANGIOGRAM FOLLOW UP STUDY  01/06/2020  . IR ANGIOGRAM FOLLOW UP STUDY  01/06/2020  . IR ANGIOGRAM FOLLOW UP STUDY  01/06/2020  . IR ANGIOGRAM FOLLOW UP STUDY  01/06/2020  . IR ANGIOGRAM FOLLOW UP STUDY  01/06/2020  . IR ANGIOGRAM FOLLOW UP STUDY  01/06/2020  . IR ANGIOGRAM FOLLOW UP STUDY  01/06/2020  . IR NEURO  EACH ADD'L AFTER BASIC UNI RIGHT (MS)  01/06/2020  . IR TRANSCATH/EMBOLIZ  01/06/2020  . RADIOLOGY WITH ANESTHESIA N/A 01/06/2020   Procedure: IR WITH ANESTHESIA FOR ANEURYSM;  Surgeon: Consuella Lose, MD;  Location: [place];  Service: Radiology;  Laterality: N/A;  . TUBAL LIGATION      There were no vitals filed for this visit.   Subjective Assessment - 07/15/20 0941    Subjective  Pt reports she is doing well, upcoming surgery next week on March 11.  No pain at rest, only with stretching and movement.    Pertinent History ICH 01/06/20, Aneurysm. PMH: OA bilateral knees and hands    Patient Stated Goals Pt would like to be as independent as possible.    Currently in Pain? No/denies    Pain Score 0-No pain           Moist heat to right shoulder, forearm and hand for 5 mins prior to exercises to decrease pain and increase ROM.    Therapeutic Exercise: Shoulder shrugs, retraction and shoulder rolls active assist with therapist and followed by AROM for each exercise with cues as needed.  Pt seen for PROMof RUE, all joints and available planes of motion from a seated position, followed by AAROM with forward reaching, ABD, elbow flexion/extension, supination of forearm,  wrist flexion/extension, digit extension with prolonged stretch. Patient performing forward reach between legs towards feet. Reaching forwards to knee.  Tabletop slides for forwards reaching, side to side for ABD/ADD.    Patient reports using right hand with french fries and pretzels, still has not tried holding a sandwich yet.    Response to tx: Pt with upcoming surgery for brain anersym in the next week, she understands she will need a new order to continue therapy.  Increased tightness this date throughout right UE this date which may be due to not having but one therapy session this week (pt had to cancel the others due to conflicting appts).  Continue to encourage self directed ROM especially at the digits.  Continue OT  to increase ROM, strength and grasping patterns to use in daily tasks.                      OT Education - 07/15/20 0942    Education Details ROM, pain control, strength, finger ROM, functional tasks at home    Person(s) Educated Patient    Methods Explanation;Demonstration;Verbal cues;Handout    Comprehension Verbalized understanding;Returned demonstration;Verbal cues required            OT Short Term Goals - 06/29/20 1217      OT SHORT TERM GOAL #4   Title Pt will demonstrate 30 A/ROM shoulder flexion for RUE with pain less than or equal to 4/10.    Baseline Pt. presents with limited isolated  right shoulder flexion with pain continues to be 2    Time 4    Period Weeks    Status Deferred      OT SHORT TERM GOAL #7   Title Pt will demonstrate at least 25% finger flexion/ extension in RUE in prep for functional use.    Baseline Limited PROM, and active digit flexion, and extension. Improved AROM for right 2nd, and 3rd digits. significant limitations with the 4th, and 5th digits.    Time 4    Period Weeks    Status Deferred             OT Long Term Goals - 06/29/20 1221      OT LONG TERM GOAL #1   Title Pt will perform  all basic ADLs with supervision    Baseline Pt. requires minA from her husband    Time 12    Period Weeks    Status On-going    Target Date 08/30/20      OT LONG TERM GOAL #2   Title Pt will demonstrate 45* RUE shoulder flexion in prep for functional reach with pain less than or equal to 3/10.    Baseline Pt. conitnues to present with limited AROM for isolated  right shoulder flexion with pain 2.    Time 12    Period Weeks    Status Partially Met    Target Date 08/30/20      OT LONG TERM GOAL #3   Title Pt will use RUE as a stabilizer/ gross A at least 30 % of the time for ADLs/ IADLs.    Baseline Pt. is using her right hand as a gross stabilizer 15% of the time.    Time 12    Period Weeks    Status On-going    Target Date  08/30/20      OT LONG TERM GOAL #4   Title Pt will increase LUE grip strength to 25 lbs or greater for increased  ease with ADLs.    Baseline Pt. has improved with left grip strength. Pt.continues to work towards 25#.    Time 12    Period Weeks    Status On-going    Target Date 08/30/20      OT LONG TERM GOAL #5   Title Pt will perform simple home management/ snack prep with min A.    Baseline Pt. conitnues to try to help when she can, however her husband mostly perfroms these tasks.    Time 12    Period Weeks    Status On-going    Target Date 08/30/20      OT LONG TERM GOAL #6   Title Pt will demonstrate ability to grasp/ release a cup 2/3 trials with RUE.    Baseline Pt. is initialing grasp, and active release of objects, however is unable to grasp/release a cup.    Time 12    Period Weeks    Status On-going    Target Date 08/30/20      OT LONG TERM GOAL #8   Title I with updated HEP.    Baseline Pt. requires assist, and cuing for HEP.    Time 12    Period Weeks    Status On-going    Target Date 08/30/20      OT LONG TERM GOAL  #9   TITLE Pt. will use her right hand to assist the left hand in searching for items in, and holding her purse.    Baseline Pt. is unable to perform    Time 12    Period Weeks    Status On-going    Target Date 08/30/20      OT LONG TERM GOAL  #10   TITLE Pt. will use her right hand to assist the left to independently place eye glasses into a case.    Baseline Pt. has difficulty perfroming.    Time 12    Period Weeks    Status On-going    Target Date 08/30/20                 Plan - 07/15/20 0943    Clinical Impression Statement Pt with upcoming surgery for brain anersym in the next week, she understands she will need a new order to continue therapy.  Increased tightness this date throughout right UE this date which may be due to not having but one therapy session this week (pt had to cancel the others due to conflicting appts).   Continue to encourage self directed ROM especially at the digits.  Continue OT to increase ROM, strength and grasping patterns to use in daily tasks.    OT Occupational Profile and History Detailed Assessment- Review of Records and additional review of physical, cognitive, psychosocial history related to current functional performance    Occupational performance deficits (Please refer to evaluation for details): ADL's;IADL's;Rest and Sleep;Work;Play;Leisure    Body Structure / Function / Physical Skills ADL;Balance;Coordination;Decreased knowledge of precautions;Decreased knowledge of use of DME;Dexterity;Edema;Mobility;Tone;Strength;IADL;Sensation;GMC;Gait;ROM;FMC;Flexibility;Pain;Vision;UE functional use;Endurance    Cognitive Skills Attention;Perception;Problem Solve;Safety Awareness;Thought    Rehab Potential Good    Clinical Decision Making Several treatment options, min-mod task modification necessary    Comorbidities Affecting Occupational Performance: None    Modification or Assistance to Complete Evaluation  Min-Moderate modification of tasks or assist with assess necessary to complete eval    OT Frequency 3x / week    OT Duration 12 weeks    OT Treatment/Interventions Self-care/ADL training;Ultrasound;Energy conservation;Visual/perceptual remediation/compensation;Patient/family education;DME and/or AE  instruction;Aquatic Therapy;Paraffin;Gait Training;Passive range of motion;Balance training;Fluidtherapy;Electrical Stimulation;Functional Mobility Training;Splinting;Moist Heat;Therapeutic exercise;Manual Therapy;Cognitive remediation/compensation;Manual lymph drainage;Neuromuscular education;Coping strategies training    Consulted and Agree with Plan of Care Patient;Family member/caregiver           Patient will benefit from skilled therapeutic intervention in order to improve the following deficits and impairments:   Body Structure / Function / Physical Skills:  ADL,Balance,Coordination,Decreased knowledge of precautions,Decreased knowledge of use of DME,Dexterity,Edema,Mobility,Tone,Strength,IADL,Sensation,GMC,Gait,ROM,FMC,Flexibility,Pain,Vision,UE functional use,Endurance Cognitive Skills: Attention,Perception,Problem Solve,Safety Awareness,Thought     Visit Diagnosis: Muscle weakness (generalized)  Other lack of coordination  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 Dyspareunia in female 04/05/2020  . History of abnormal cervical Pap smear 03/10/2020  . GIB (gastrointestinal bleeding) 02/28/2020  . Elevated BUN   . Prediabetes   . 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 Cerebral vasospasm   . Sinus tachycardia   . Dysphagia, post-stroke   . Thrombocytopenia ([place])   . Acute blood loss anemia   . Brain aneurysm   . ICH (intracerebral hemorrhage) ([place]) 01/05/2020  . History of adenomatous polyp of colon 02/07/2016  . 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 Fissure in ano 11/11/2014  . Hemorrhoids 11/11/2014  . Constipation 11/19/2013  . GERD (gastroesophageal reflux disease) 03/24/2013  . Irritable bowel syndrome with diarrhea 03/24/2013  . Herpes zoster 05/14/2005  . Abnormal Pap smear of cervix 05/15/1983  . History of cervical dysplasia 05/15/1983   Achilles Dunk, OTR/L, CLT  Sanari Offner 07/16/2020, 10:35 AM  [place] MAIN [facility] SERVICES [address] [place], [place], [address] Phone: [phone]   Fax:  [phone]  Name: NYALA KIRCHNER MRN: [id number] Date of Birth: June 06, 1955

## 2020-07-18 ENCOUNTER — Encounter: Payer: BC Managed Care – PPO | Admitting: Speech Pathology

## 2020-07-19 ENCOUNTER — Encounter: Payer: BC Managed Care – PPO | Admitting: Speech Pathology

## 2020-07-19 ENCOUNTER — Other Ambulatory Visit: Payer: Self-pay

## 2020-07-19 ENCOUNTER — Ambulatory Visit: Payer: BC Managed Care – PPO | Admitting: Occupational Therapy

## 2020-07-19 DIAGNOSIS — I607 Nontraumatic subarachnoid hemorrhage from unspecified intracranial artery: Secondary | ICD-10-CM

## 2020-07-19 DIAGNOSIS — I69159 Hemiplegia and hemiparesis following nontraumatic intracerebral hemorrhage affecting unspecified side: Secondary | ICD-10-CM

## 2020-07-19 DIAGNOSIS — M6281 Muscle weakness (generalized): Secondary | ICD-10-CM | POA: Diagnosis not present

## 2020-07-19 DIAGNOSIS — R278 Other lack of coordination: Secondary | ICD-10-CM

## 2020-07-20 ENCOUNTER — Encounter: Payer: BC Managed Care – PPO | Admitting: Occupational Therapy

## 2020-07-20 ENCOUNTER — Ambulatory Visit: Payer: BC Managed Care – PPO | Admitting: Occupational Therapy

## 2020-07-20 DIAGNOSIS — I69159 Hemiplegia and hemiparesis following nontraumatic intracerebral hemorrhage affecting unspecified side: Secondary | ICD-10-CM

## 2020-07-20 DIAGNOSIS — R278 Other lack of coordination: Secondary | ICD-10-CM

## 2020-07-20 DIAGNOSIS — M6281 Muscle weakness (generalized): Secondary | ICD-10-CM

## 2020-07-20 DIAGNOSIS — I607 Nontraumatic subarachnoid hemorrhage from unspecified intracranial artery: Secondary | ICD-10-CM

## 2020-07-22 ENCOUNTER — Encounter: Payer: BC Managed Care – PPO | Admitting: Occupational Therapy

## 2020-07-23 ENCOUNTER — Encounter: Payer: Self-pay | Admitting: Occupational Therapy

## 2020-07-23 NOTE — Therapy (Signed)
[place] MAIN [facility] SERVICES [address] [place], [place], [address] Phone: [phone]   Fax:  [phone]  Occupational Therapy Treatment  Patient Details  Name: Erika Stanton MRN: [id number] Date of Birth: [birth year] Referring Provider (OT): Dr. Naaman Plummer   Encounter Date: 07/20/2020   OT End of Session - 07/23/20 1745    Visit Number 45    Number of Visits 60    Date for OT Re-Evaluation 08/30/20    Authorization Type Progress report period starting 06/29/2020    OT Start Time 1015    OT Stop Time 1100    OT Time Calculation (min) 45 min    Activity Tolerance Patient tolerated treatment well    Behavior During Therapy [facility] for tasks assessed/performed           Past Medical History:  Diagnosis Date  . Aneurysm ([place])   . Melena     Past Surgical History:  Procedure Laterality Date  . CHOLECYSTECTOMY    . COLONOSCOPY  11/2017   at [facility]. no recurrent polyps.  suggest repeat surveillance study 11/2022.    Marland Kitchen COLONOSCOPY W/ POLYPECTOMY  06/2014   Dr Roxy Manns at [facility].  3 adenomatoous polyps, anal fissure.    . ESOPHAGOGASTRODUODENOSCOPY (EGD) WITH PROPOFOL N/A 01/27/2020   Procedure: ESOPHAGOGASTRODUODENOSCOPY (EGD) WITH PROPOFOL;  Surgeon: Mauri Pole, MD;  Location: [place] [facility];  Service: Endoscopy;  Laterality: N/A;  . IR 3D INDEPENDENT WKST  01/06/2020  . IR ANGIO INTRA EXTRACRAN SEL INTERNAL CAROTID BILAT MOD SED  01/06/2020  . IR ANGIO VERTEBRAL SEL VERTEBRAL UNI L MOD SED  01/06/2020  . IR ANGIOGRAM FOLLOW UP STUDY  01/06/2020  . IR ANGIOGRAM FOLLOW UP STUDY  01/06/2020  . IR ANGIOGRAM FOLLOW UP STUDY  01/06/2020  . IR ANGIOGRAM FOLLOW UP STUDY  01/06/2020  . IR ANGIOGRAM FOLLOW UP STUDY  01/06/2020  . IR ANGIOGRAM FOLLOW UP STUDY  01/06/2020  . IR ANGIOGRAM FOLLOW UP STUDY  01/06/2020  . IR ANGIOGRAM FOLLOW UP STUDY  01/06/2020  . IR ANGIOGRAM FOLLOW UP STUDY  01/06/2020  . IR ANGIOGRAM FOLLOW UP STUDY  01/06/2020  . IR NEURO  EACH ADD'L AFTER BASIC UNI RIGHT (MS)  01/06/2020  . IR TRANSCATH/EMBOLIZ  01/06/2020  . RADIOLOGY WITH ANESTHESIA N/A 01/06/2020   Procedure: IR WITH ANESTHESIA FOR ANEURYSM;  Surgeon: Consuella Lose, MD;  Location: [place];  Service: Radiology;  Laterality: N/A;  . TUBAL LIGATION      There were no vitals filed for this visit.   Subjective Assessment - 07/23/20 1744    Subjective  Pt continues to report being nervous about upcoming surgery, has difficulty with focusing on exercises.    Pertinent History ICH 01/06/20, Aneurysm. PMH: OA bilateral knees and hands    Patient Stated Goals Pt would like to be as independent as possible.    Currently in Pain? Yes    Pain Score 4     Pain Location Arm    Pain Orientation Right    Pain Descriptors / Indicators Aching;Tender    Pain Type Chronic pain    Pain Onset More than a month ago    Pain Frequency Intermittent           Moist heat to right shoulder, forearm and hand for 5 mins prior to exercises to decrease pain and increase ROM. Pt reports she will have to be at the hospital Friday at 530 am for her procedure.  Therapeutic Exercise: Shoulder shrugs, retraction and shoulder rolls active assist with therapist and followed by AROM for each exercise with cues as needed performed in sitting.    Pt seen for PROMof RUE, all joints and available planes of motionfrom a seated position, followed by AAROM with forward reaching, ABD, elbow flexion/extension, supination of forearm, wrist flexion/extension, digit extension with prolonged stretch. Patient performing forward reach between legs towards feet.Reaching forwards to knee. Tabletop slides for forwards reaching, side to side for ABD/ADD. Additional focus on wrist today with increased tightness, passive stretch towards flex/ext followed by AAROM.  Juxtaciser this date to work towards multidirectional wrist motion on right, cues for technique and occasional assist.  Response to  tx: Pt has difficulty focusing on therapy this week with pending upcoming surgery.  Pt benefits from gentle ROM to right UE, provided encouragement and support during session.  Pt demonstrates understanding that she will need an order to continue OT after surgery.  Recommended continued participation in HEP this week especially before surgery since she may not be up to working on exercises just after surgery.  Continue next week as pt tolerates, adjust exercises as needed in response to any precautions and limitations after surgery for aneurysm.                        OT Education - 07/23/20 1745    Education Details ROM, pain control, strength, finger ROM, functional tasks at home    Person(s) Educated Patient    Methods Explanation;Demonstration;Verbal cues;Handout    Comprehension Verbalized understanding;Returned demonstration;Verbal cues required            OT Short Term Goals - 06/29/20 1217      OT SHORT TERM GOAL #4   Title Pt will demonstrate 30 A/ROM shoulder flexion for RUE with pain less than or equal to 4/10.    Baseline Pt. presents with limited isolated  right shoulder flexion with pain continues to be 2    Time 4    Period Weeks    Status Deferred      OT SHORT TERM GOAL #7   Title Pt will demonstrate at least 25% finger flexion/ extension in RUE in prep for functional use.    Baseline Limited PROM, and active digit flexion, and extension. Improved AROM for right 2nd, and 3rd digits. significant limitations with the 4th, and 5th digits.    Time 4    Period Weeks    Status Deferred             OT Long Term Goals - 06/29/20 1221      OT LONG TERM GOAL #1   Title Pt will perform  all basic ADLs with supervision    Baseline Pt. requires minA from her husband    Time 12    Period Weeks    Status On-going    Target Date 08/30/20      OT LONG TERM GOAL #2   Title Pt will demonstrate 45* RUE shoulder flexion in prep for functional reach with  pain less than or equal to 3/10.    Baseline Pt. conitnues to present with limited AROM for isolated  right shoulder flexion with pain 2.    Time 12    Period Weeks    Status Partially Met    Target Date 08/30/20      OT LONG TERM GOAL #3   Title Pt will use RUE as a stabilizer/ gross A at least  30 % of the time for ADLs/ IADLs.    Baseline Pt. is using her right hand as a gross stabilizer 15% of the time.    Time 12    Period Weeks    Status On-going    Target Date 08/30/20      OT LONG TERM GOAL #4   Title Pt will increase LUE grip strength to 25 lbs or greater for increased ease with ADLs.    Baseline Pt. has improved with left grip strength. Pt.continues to work towards 25#.    Time 12    Period Weeks    Status On-going    Target Date 08/30/20      OT LONG TERM GOAL #5   Title Pt will perform simple home management/ snack prep with min A.    Baseline Pt. conitnues to try to help when she can, however her husband mostly perfroms these tasks.    Time 12    Period Weeks    Status On-going    Target Date 08/30/20      OT LONG TERM GOAL #6   Title Pt will demonstrate ability to grasp/ release a cup 2/3 trials with RUE.    Baseline Pt. is initialing grasp, and active release of objects, however is unable to grasp/release a cup.    Time 12    Period Weeks    Status On-going    Target Date 08/30/20      OT LONG TERM GOAL #8   Title I with updated HEP.    Baseline Pt. requires assist, and cuing for HEP.    Time 12    Period Weeks    Status On-going    Target Date 08/30/20      OT LONG TERM GOAL  #9   TITLE Pt. will use her right hand to assist the left hand in searching for items in, and holding her purse.    Baseline Pt. is unable to perform    Time 12    Period Weeks    Status On-going    Target Date 08/30/20      OT LONG TERM GOAL  #10   TITLE Pt. will use her right hand to assist the left to independently place eye glasses into a case.    Baseline Pt. has  difficulty perfroming.    Time 12    Period Weeks    Status On-going    Target Date 08/30/20                 Plan - 07/23/20 1746    Clinical Impression Statement Pt has difficulty focusing on therapy this week with pending upcoming surgery.  Pt benefits from gentle ROM to right UE, provided encouragement and support during session.  Pt demonstrates understanding that she will need an order to continue OT after surgery.  Recommended continued participation in HEP this week especially before surgery since she may not be up to working on exercises just after surgery.  Continue next week as pt tolerates, adjust exercises as needed in response to any precautions and limitations after surgery for aneurysm.    OT Occupational Profile and History Detailed Assessment- Review of Records and additional review of physical, cognitive, psychosocial history related to current functional performance    Occupational performance deficits (Please refer to evaluation for details): ADL's;IADL's;Rest and Sleep;Work;Play;Leisure    Body Structure / Function / Physical Skills ADL;Balance;Coordination;Decreased knowledge of precautions;Decreased knowledge of use of DME;Dexterity;Edema;Mobility;Tone;Strength;IADL;Sensation;GMC;Gait;ROM;FMC;Flexibility;Pain;Vision;UE functional use;Endurance    Cognitive  Skills Attention;Perception;Problem Solve;Safety Awareness;Thought    Rehab Potential Good    Clinical Decision Making Several treatment options, min-mod task modification necessary    Comorbidities Affecting Occupational Performance: None    Modification or Assistance to Complete Evaluation  Min-Moderate modification of tasks or assist with assess necessary to complete eval    OT Frequency 3x / week    OT Duration 12 weeks    OT Treatment/Interventions Self-care/ADL training;Ultrasound;Energy conservation;Visual/perceptual remediation/compensation;Patient/family education;DME and/or AE instruction;Aquatic  Therapy;Paraffin;Gait Training;Passive range of motion;Balance training;Fluidtherapy;Electrical Stimulation;Functional Mobility Training;Splinting;Moist Heat;Therapeutic exercise;Manual Therapy;Cognitive remediation/compensation;Manual lymph drainage;Neuromuscular education;Coping strategies training    Consulted and Agree with Plan of Care Patient;Family member/caregiver           Patient will benefit from skilled therapeutic intervention in order to improve the following deficits and impairments:   Body Structure / Function / Physical Skills: ADL,Balance,Coordination,Decreased knowledge of precautions,Decreased knowledge of use of DME,Dexterity,Edema,Mobility,Tone,Strength,IADL,Sensation,GMC,Gait,ROM,FMC,Flexibility,Pain,Vision,UE functional use,Endurance Cognitive Skills: Attention,Perception,Problem Solve,Safety Awareness,Thought     Visit Diagnosis: Muscle weakness (generalized)  Other lack of coordination  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 Dyspareunia in female 04/05/2020  . History of abnormal cervical Pap smear 03/10/2020  . GIB (gastrointestinal bleeding) 02/28/2020  . Elevated BUN   . Prediabetes   . 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 Cerebral vasospasm   . Sinus tachycardia   . Dysphagia, post-stroke   . Thrombocytopenia ([place])   . Acute blood loss anemia   . Brain aneurysm   . ICH (intracerebral hemorrhage) ([place]) 01/05/2020  . History of adenomatous polyp of colon 02/07/2016  . 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 Fissure in ano 11/11/2014  . Hemorrhoids 11/11/2014  . Constipation 11/19/2013  . GERD (gastroesophageal reflux disease) 03/24/2013  . Irritable bowel syndrome with diarrhea 03/24/2013  . Herpes zoster 05/14/2005  . Abnormal Pap smear of cervix 05/15/1983  .  History of cervical dysplasia 05/15/1983   Achilles Dunk, OTR/L, CLT  Reyan Helle 07/23/2020, 5:54 PM  [place] MAIN [facility] SERVICES [address] [place], [place], [address] Phone: [phone]   Fax:  [phone]  Name: Erika Stanton MRN: [id number] Date of Birth: Sep 16, 1955

## 2020-07-23 NOTE — Therapy (Signed)
[place] MAIN [facility] SERVICES [address] [place], [place], [address] Phone: [phone]   Fax:  [phone]  Occupational Therapy Treatment  Patient Details  Name: Erika Stanton MRN: [id number] Date of Birth: April 24, 1956 Referring Provider (OT): Dr. Naaman Plummer   Encounter Date: 07/19/2020   OT End of Session - 07/23/20 1733    Visit Number 44    Number of Visits 60    Date for OT Re-Evaluation 08/30/20    Authorization Type Progress report period starting 06/29/2020    OT Start Time 1014    OT Stop Time 1100    OT Time Calculation (min) 46 min    Activity Tolerance Patient tolerated treatment well    Behavior During Therapy [facility] for tasks assessed/performed           Past Medical History:  Diagnosis Date  . Aneurysm ([place])   . Melena     Past Surgical History:  Procedure Laterality Date  . CHOLECYSTECTOMY    . COLONOSCOPY  11/2017   at [facility]. no recurrent polyps.  suggest repeat surveillance study 11/2022.    Marland Kitchen COLONOSCOPY W/ POLYPECTOMY  06/2014   Dr Roxy Manns at [facility].  3 adenomatoous polyps, anal fissure.    . ESOPHAGOGASTRODUODENOSCOPY (EGD) WITH PROPOFOL N/A 01/27/2020   Procedure: ESOPHAGOGASTRODUODENOSCOPY (EGD) WITH PROPOFOL;  Surgeon: Mauri Pole, MD;  Location: [place] [facility];  Service: Endoscopy;  Laterality: N/A;  . IR 3D INDEPENDENT WKST  01/06/2020  . IR ANGIO INTRA EXTRACRAN SEL INTERNAL CAROTID BILAT MOD SED  01/06/2020  . IR ANGIO VERTEBRAL SEL VERTEBRAL UNI L MOD SED  01/06/2020  . IR ANGIOGRAM FOLLOW UP STUDY  01/06/2020  . IR ANGIOGRAM FOLLOW UP STUDY  01/06/2020  . IR ANGIOGRAM FOLLOW UP STUDY  01/06/2020  . IR ANGIOGRAM FOLLOW UP STUDY  01/06/2020  . IR ANGIOGRAM FOLLOW UP STUDY  01/06/2020  . IR ANGIOGRAM FOLLOW UP STUDY  01/06/2020  . IR ANGIOGRAM FOLLOW UP STUDY  01/06/2020  . IR ANGIOGRAM FOLLOW UP STUDY  01/06/2020  . IR ANGIOGRAM FOLLOW UP STUDY  01/06/2020  . IR ANGIOGRAM FOLLOW UP STUDY  01/06/2020  . IR NEURO  EACH ADD'L AFTER BASIC UNI RIGHT (MS)  01/06/2020  . IR TRANSCATH/EMBOLIZ  01/06/2020  . RADIOLOGY WITH ANESTHESIA N/A 01/06/2020   Procedure: IR WITH ANESTHESIA FOR ANEURYSM;  Surgeon: Consuella Lose, MD;  Location: [place];  Service: Radiology;  Laterality: N/A;  . TUBAL LIGATION      There were no vitals filed for this visit.   Subjective Assessment - 07/23/20 1732    Subjective  Pt reports she is nervous about surgery this week, is ready to be done with it.    Pertinent History ICH 01/06/20, Aneurysm. PMH: OA bilateral knees and hands    Patient Stated Goals Pt would like to be as independent as possible.    Currently in Pain? Yes    Pain Score 4     Pain Location Arm    Pain Orientation Right    Pain Descriptors / Indicators Aching    Pain Type Chronic pain    Pain Onset More than a month ago    Pain Frequency Intermittent           Moist heat to right shoulder, forearm and hand for 5 mins prior to exercises to decrease pain and increase ROM.    Therapeutic Exercise: Shoulder shrugs, retraction and shoulder rolls active assist with therapist and followed by AROM  for each exercise with cues as needed performed in sitting.    Pt seen for PROMof RUE, all joints and available planes of motion from a seated position, followed by AAROM with forward reaching, ABD, elbow flexion/extension, supination of forearm, wrist flexion/extension, digit extension with prolonged stretch. Patient performing forward reach between legs towards feet.Reaching forwards to knee. Tabletop slides for forwards reaching, side to side for ABD/ADD.    Gross grasp and release of 1 inch objects.  Difficulty with release at times and requires facilitory techniques.  Response to tx: Pt with increased tone and tightness this date especially in digits, patient has been wearing splint at night.  With upcoming surgery this week, patient admits to feeling worried and increase stress which may be a contributing  factor of increased tightness in right UE.  Encouraged patient to participate in stress reduction activities this week and to continue with ROM, positioning and exercises as much as possible.  Continue towards goals in plan of care to improve RUE use.                       OT Education - 07/23/20 1733    Education Details ROM, pain control, strength, finger ROM, functional tasks at home    Person(s) Educated Patient    Methods Explanation;Demonstration;Verbal cues;Handout    Comprehension Verbalized understanding;Returned demonstration;Verbal cues required            OT Short Term Goals - 06/29/20 1217      OT SHORT TERM GOAL #4   Title Pt will demonstrate 30 A/ROM shoulder flexion for RUE with pain less than or equal to 4/10.    Baseline Pt. presents with limited isolated  right shoulder flexion with pain continues to be 2    Time 4    Period Weeks    Status Deferred      OT SHORT TERM GOAL #7   Title Pt will demonstrate at least 25% finger flexion/ extension in RUE in prep for functional use.    Baseline Limited PROM, and active digit flexion, and extension. Improved AROM for right 2nd, and 3rd digits. significant limitations with the 4th, and 5th digits.    Time 4    Period Weeks    Status Deferred             OT Long Term Goals - 06/29/20 1221      OT LONG TERM GOAL #1   Title Pt will perform  all basic ADLs with supervision    Baseline Pt. requires minA from her husband    Time 12    Period Weeks    Status On-going    Target Date 08/30/20      OT LONG TERM GOAL #2   Title Pt will demonstrate 45* RUE shoulder flexion in prep for functional reach with pain less than or equal to 3/10.    Baseline Pt. conitnues to present with limited AROM for isolated  right shoulder flexion with pain 2.    Time 12    Period Weeks    Status Partially Met    Target Date 08/30/20      OT LONG TERM GOAL #3   Title Pt will use RUE as a stabilizer/ gross A at least  30 % of the time for ADLs/ IADLs.    Baseline Pt. is using her right hand as a gross stabilizer 15% of the time.    Time 12    Period Weeks  Status On-going    Target Date 08/30/20      OT LONG TERM GOAL #4   Title Pt will increase LUE grip strength to 25 lbs or greater for increased ease with ADLs.    Baseline Pt. has improved with left grip strength. Pt.continues to work towards 25#.    Time 12    Period Weeks    Status On-going    Target Date 08/30/20      OT LONG TERM GOAL #5   Title Pt will perform simple home management/ snack prep with min A.    Baseline Pt. conitnues to try to help when she can, however her husband mostly perfroms these tasks.    Time 12    Period Weeks    Status On-going    Target Date 08/30/20      OT LONG TERM GOAL #6   Title Pt will demonstrate ability to grasp/ release a cup 2/3 trials with RUE.    Baseline Pt. is initialing grasp, and active release of objects, however is unable to grasp/release a cup.    Time 12    Period Weeks    Status On-going    Target Date 08/30/20      OT LONG TERM GOAL #8   Title I with updated HEP.    Baseline Pt. requires assist, and cuing for HEP.    Time 12    Period Weeks    Status On-going    Target Date 08/30/20      OT LONG TERM GOAL  #9   TITLE Pt. will use her right hand to assist the left hand in searching for items in, and holding her purse.    Baseline Pt. is unable to perform    Time 12    Period Weeks    Status On-going    Target Date 08/30/20      OT LONG TERM GOAL  #10   TITLE Pt. will use her right hand to assist the left to independently place eye glasses into a case.    Baseline Pt. has difficulty perfroming.    Time 12    Period Weeks    Status On-going    Target Date 08/30/20                 Plan - 07/23/20 1734    Clinical Impression Statement Pt with increased tone and tightness this date especially in digits, patient has been wearing splint at night.  With upcoming  surgery this week, patient admits to feeling worried and increase stress which may be a contributing factor of increased tightness in right UE.  Encouraged patient to participate in stress reduction activities this week and to continue with ROM, positioning and exercises as much as possible.  Continue towards goals in plan of care to improve RUE use.    OT Occupational Profile and History Detailed Assessment- Review of Records and additional review of physical, cognitive, psychosocial history related to current functional performance    Occupational performance deficits (Please refer to evaluation for details): ADL's;IADL's;Rest and Sleep;Work;Play;Leisure    Body Structure / Function / Physical Skills ADL;Balance;Coordination;Decreased knowledge of precautions;Decreased knowledge of use of DME;Dexterity;Edema;Mobility;Tone;Strength;IADL;Sensation;GMC;Gait;ROM;FMC;Flexibility;Pain;Vision;UE functional use;Endurance    Cognitive Skills Attention;Perception;Problem Solve;Safety Awareness;Thought    Rehab Potential Good    Clinical Decision Making Several treatment options, min-mod task modification necessary    Comorbidities Affecting Occupational Performance: None    Modification or Assistance to Complete Evaluation  Min-Moderate modification of tasks or assist with  assess necessary to complete eval    OT Frequency 3x / week    OT Duration 12 weeks    OT Treatment/Interventions Self-care/ADL training;Ultrasound;Energy conservation;Visual/perceptual remediation/compensation;Patient/family education;DME and/or AE instruction;Aquatic Therapy;Paraffin;Gait Training;Passive range of motion;Balance training;Fluidtherapy;Electrical Stimulation;Functional Mobility Training;Splinting;Moist Heat;Therapeutic exercise;Manual Therapy;Cognitive remediation/compensation;Manual lymph drainage;Neuromuscular education;Coping strategies training    Consulted and Agree with Plan of Care Patient;Family member/caregiver            Patient will benefit from skilled therapeutic intervention in order to improve the following deficits and impairments:   Body Structure / Function / Physical Skills: ADL,Balance,Coordination,Decreased knowledge of precautions,Decreased knowledge of use of DME,Dexterity,Edema,Mobility,Tone,Strength,IADL,Sensation,GMC,Gait,ROM,FMC,Flexibility,Pain,Vision,UE functional use,Endurance Cognitive Skills: Attention,Perception,Problem Solve,Safety Awareness,Thought     Visit Diagnosis: Muscle weakness (generalized)  Other lack of coordination  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 Dyspareunia in female 04/05/2020  . History of abnormal cervical Pap smear 03/10/2020  . GIB (gastrointestinal bleeding) 02/28/2020  . Elevated BUN   . Prediabetes   . 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 Cerebral vasospasm   . Sinus tachycardia   . Dysphagia, post-stroke   . Thrombocytopenia ([place])   . Acute blood loss anemia   . Brain aneurysm   . ICH (intracerebral hemorrhage) ([place]) 01/05/2020  . History of adenomatous polyp of colon 02/07/2016  . 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 Fissure in ano 11/11/2014  . Hemorrhoids 11/11/2014  . Constipation 11/19/2013  . GERD (gastroesophageal reflux disease) 03/24/2013  . Irritable bowel syndrome with diarrhea 03/24/2013  . Herpes zoster 05/14/2005  . Abnormal Pap smear of cervix 05/15/1983  . History of cervical dysplasia 05/15/1983   Achilles Dunk, OTR/L, CLT  Lovett,Amy 07/23/2020, 5:41 PM  [place] MAIN [facility] SERVICES [address] [place], [place], [address] Phone: [phone]   Fax:  [phone]  Name: ASCENCION STEGNER MRN: [id number] Date of Birth: [birth year]

## 2020-07-26 ENCOUNTER — Encounter: Payer: BC Managed Care – PPO | Admitting: Occupational Therapy

## 2020-07-26 ENCOUNTER — Encounter: Payer: BC Managed Care – PPO | Admitting: Speech Pathology

## 2020-07-26 ENCOUNTER — Encounter
Payer: BC Managed Care – PPO | Attending: Physical Medicine and Rehabilitation | Admitting: Physical Medicine and Rehabilitation

## 2020-07-26 ENCOUNTER — Encounter: Payer: Self-pay | Admitting: Physical Medicine and Rehabilitation

## 2020-07-26 ENCOUNTER — Other Ambulatory Visit: Payer: Self-pay

## 2020-07-26 VITALS — BP 136/83 | HR 70 | Temp 98.1°F | Ht 67.0 in | Wt 185.6 lb

## 2020-07-26 DIAGNOSIS — I69819 Unspecified symptoms and signs involving cognitive functions following other cerebrovascular disease: Secondary | ICD-10-CM | POA: Diagnosis present

## 2020-07-26 DIAGNOSIS — M7501 Adhesive capsulitis of right shoulder: Secondary | ICD-10-CM | POA: Diagnosis present

## 2020-07-26 DIAGNOSIS — I69398 Other sequelae of cerebral infarction: Secondary | ICD-10-CM | POA: Insufficient documentation

## 2020-07-26 DIAGNOSIS — R252 Cramp and spasm: Secondary | ICD-10-CM | POA: Diagnosis present

## 2020-07-26 DIAGNOSIS — I607 Nontraumatic subarachnoid hemorrhage from unspecified intracranial artery: Secondary | ICD-10-CM | POA: Diagnosis present

## 2020-07-26 DIAGNOSIS — C50919 Malignant neoplasm of unspecified site of unspecified female breast: Secondary | ICD-10-CM | POA: Insufficient documentation

## 2020-07-26 DIAGNOSIS — F411 Generalized anxiety disorder: Secondary | ICD-10-CM | POA: Insufficient documentation

## 2020-07-26 MED ORDER — GABAPENTIN 300 MG PO CAPS: 300.0000 mg | ORAL_CAPSULE | Freq: Three times a day (TID) | ORAL | 5 refills | Status: DC

## 2020-07-26 NOTE — Progress Notes (Signed)
Subjective:    Patient ID: Erika Stanton, female    DOB: 02-03-1956, 65 y.o.   MRN: [id number]  HPI  Erika Stanton is a 65 year old woman who present for follow-up of cerebral aneurysm rupture.  1) Cerebral aneurysm rupture -she had brain surgery last week and is recovering well -she does have a headache -She continues to be unable to use her right hand and this inhibits her from doing her formed work, for which she needed -she continues OT -she has graduated from PT  3) Cognitive deficits: - Her husband notes higher level deficits -She has graduated speech therapy.  -She would like to follow-up with neuropsych  4) Breast cancer -newly diagnosed -Becomes tearful about this, feels overwhelmed  5) Adhesive capsulitis -improving -pain is 2/10 today. Does not need steroid injection  Pain Inventory Average Pain 2 Pain Right Now 2 My pain is varies  LOCATION OF PAIN  Hand and fingers  BOWEL Number of stools per week:3-4 Oral laxative use Yes  Type of laxative miralax Enema or suppository use No  History of colostomy No  Incontinent No   BLADDER Normal In and out cath, frequency Able to self cath No  Bladder incontinence No  Frequent urination No  Leakage with coughing No  Difficulty starting stream No  Incomplete bladder emptying No    Mobility how many minutes can you walk? 10 ability to climb steps?  yes do you drive?  no Do you have any goals in this area?  yes  Function disabled: date disabled 01/05/20 I need assistance with the following:  feeding, dressing, bathing, toileting, meal prep, household duties and shopping  Neuro/Psych No problems in this area  Prior Studies Any changes since last visit?  yes  She has just had her second aneurysm procedure Physicians involved in your care Any changes since last visit?  no   Family History  Problem Relation Age of Onset  . Cancer Mother   . Congestive Heart Failure Mother   . Lung cancer Father    . Lung cancer Sister    Social History   Socioeconomic History  . Marital status: Married    Spouse name: Not on file  . Number of children: Not on file  . Years of education: Not on file  . Highest education level: Not on file  Occupational History  . Not on file  Tobacco Use  . Smoking status: Former Research scientist (life sciences)  . Smokeless tobacco: Never Used  Substance and Sexual Activity  . Alcohol use: Not on file  . Drug use: Not on file  . Sexual activity: Not on file  Other Topics Concern  . Not on file  Social History Narrative  . Not on file   Social Determinants of Health   Financial Resource Strain: Not on file  Food Insecurity: Not on file  Transportation Needs: Not on file  Physical Activity: Not on file  Stress: Not on file  Social Connections: Not on file   Past Surgical History:  Procedure Laterality Date  . CHOLECYSTECTOMY    . COLONOSCOPY  11/2017   at [facility]. no recurrent polyps.  suggest repeat surveillance study 11/2022.    Marland Kitchen COLONOSCOPY W/ POLYPECTOMY  06/2014   Dr Roxy Manns at [facility].  3 adenomatoous polyps, anal fissure.    . ESOPHAGOGASTRODUODENOSCOPY (EGD) WITH PROPOFOL N/A 01/27/2020   Procedure: ESOPHAGOGASTRODUODENOSCOPY (EGD) WITH PROPOFOL;  Surgeon: Mauri Pole, MD;  Location: [place] [facility];  Service: Endoscopy;  Laterality: N/A;  .  IR 3D INDEPENDENT WKST  01/06/2020  . IR ANGIO INTRA EXTRACRAN SEL INTERNAL CAROTID BILAT MOD SED  01/06/2020  . IR ANGIO VERTEBRAL SEL VERTEBRAL UNI L MOD SED  01/06/2020  . IR ANGIOGRAM FOLLOW UP STUDY  01/06/2020  . IR ANGIOGRAM FOLLOW UP STUDY  01/06/2020  . IR ANGIOGRAM FOLLOW UP STUDY  01/06/2020  . IR ANGIOGRAM FOLLOW UP STUDY  01/06/2020  . IR ANGIOGRAM FOLLOW UP STUDY  01/06/2020  . IR ANGIOGRAM FOLLOW UP STUDY  01/06/2020  . IR ANGIOGRAM FOLLOW UP STUDY  01/06/2020  . IR ANGIOGRAM FOLLOW UP STUDY  01/06/2020  . IR ANGIOGRAM FOLLOW UP STUDY  01/06/2020  . IR ANGIOGRAM FOLLOW UP STUDY  01/06/2020  . IR NEURO EACH ADD'L AFTER  BASIC UNI RIGHT (MS)  01/06/2020  . IR TRANSCATH/EMBOLIZ  01/06/2020  . RADIOLOGY WITH ANESTHESIA N/A 01/06/2020   Procedure: IR WITH ANESTHESIA FOR ANEURYSM;  Surgeon: Consuella Lose, MD;  Location: [place];  Service: Radiology;  Laterality: N/A;  . TUBAL LIGATION     Past Medical History:  Diagnosis Date  . Aneurysm ([place])   . Melena    BP 136/83   Pulse 70   Temp 98.1 F (36.7 C)   Ht 5\' 7"  (1.702 m)   Wt 185 lb 9.6 oz (84.2 kg)   SpO2 96%   BMI 29.07 kg/m   Opioid Risk Score:   Fall Risk Score:  `1  Depression screen PHQ 2/9  Depression screen [facility] 2/9 07/26/2020 03/22/2020  Decreased Interest 1 0  Down, Depressed, Hopeless 1 1  PHQ - 2 Score 2 1  Altered sleeping - 0  Tired, decreased energy - 1  Change in appetite - 0  Feeling bad or failure about yourself  - 0  Trouble concentrating - 3  Moving slowly or fidgety/restless - 0  Suicidal thoughts - 0  PHQ-9 Score - 5  Difficult doing work/chores - Very difficult    Review of Systems  Constitutional: Negative.   HENT: Negative.   Eyes: Negative.   Respiratory: Negative.   Cardiovascular: Negative.   Gastrointestinal: Positive for constipation.  Endocrine: Negative.   Genitourinary: Negative.   Musculoskeletal: Negative.   Skin: Negative.   Allergic/Immunologic: Negative.   Neurological: Negative.   Hematological: Bruises/bleeds easily.       Plavix  Psychiatric/Behavioral: Positive for dysphoric mood.  All other systems reviewed and are negative.      Objective:   Physical Exam Gen: no distress, normal appearing HEENT: oral mucosa pink and moist, NCAT Cardio: Reg rate Chest: normal effort, normal rate of breathing Abd: soft, non-distended Ext: no edema Psych: pleasant, normal affect Skin: intact Neuro: Alert and oriented x3 Musculoskeletal: RUE with MAS 2 in WF, 4th and 5th digit flexion, 1 in elbow     Assessment & Plan:  Erika Stanton is a 65 year old woman who presents for follow-up of  cerebral aneurysm rupture  1) Cerebral aneurysm rupture -discussed etiologies for her condition and what is under her control -she is recovering well from her surgery last week.  -has some headache, otherwise symptom free -continue PT HEP -I will complete disability form today -Neuropsych referral for cognitive testing/counseling  2) RUE spasticity: -Botox 100U to be divided among FDP, FDS, FCR, and FCU in 2 months -continue TID stretching  3) Anxiety:  -refilled gabapentin -recommended meditation  4) Breast cancer -provided emotional support -discussed benefits and mechanism of ketogenic diet   5) Impaired mobility and ADLs: -gradually  increase walking with a future goal of 30 minutes per day  >40 minutes spent in discussion of etiologies of cerebral aneurysm rupture, goal of increasing exercise regimen, pursuing the activities that give her most joy, spending daily time outdoors, scheduling neuuropsych eval, completing disability form, discussing anxiety/positive reframing/meditation/refilling gabapentin and discussing risks and benefits of this medication, examination of RUE, discussion of risks and benefits of Botox, plan for Botox next visit

## 2020-07-27 ENCOUNTER — Encounter: Payer: BC Managed Care – PPO | Admitting: Speech Pathology

## 2020-07-27 ENCOUNTER — Ambulatory Visit: Payer: BC Managed Care – PPO | Admitting: Occupational Therapy

## 2020-07-27 ENCOUNTER — Encounter: Payer: Self-pay | Admitting: Occupational Therapy

## 2020-07-27 DIAGNOSIS — R278 Other lack of coordination: Secondary | ICD-10-CM

## 2020-07-27 DIAGNOSIS — I69159 Hemiplegia and hemiparesis following nontraumatic intracerebral hemorrhage affecting unspecified side: Secondary | ICD-10-CM

## 2020-07-27 DIAGNOSIS — M6281 Muscle weakness (generalized): Secondary | ICD-10-CM | POA: Diagnosis not present

## 2020-07-27 DIAGNOSIS — I607 Nontraumatic subarachnoid hemorrhage from unspecified intracranial artery: Secondary | ICD-10-CM

## 2020-07-29 ENCOUNTER — Encounter: Payer: Self-pay | Admitting: Occupational Therapy

## 2020-07-29 ENCOUNTER — Other Ambulatory Visit: Payer: Self-pay

## 2020-07-29 ENCOUNTER — Ambulatory Visit: Payer: BC Managed Care – PPO | Admitting: Occupational Therapy

## 2020-07-29 DIAGNOSIS — R278 Other lack of coordination: Secondary | ICD-10-CM

## 2020-07-29 DIAGNOSIS — M6281 Muscle weakness (generalized): Secondary | ICD-10-CM

## 2020-07-29 DIAGNOSIS — I69159 Hemiplegia and hemiparesis following nontraumatic intracerebral hemorrhage affecting unspecified side: Secondary | ICD-10-CM

## 2020-07-29 DIAGNOSIS — I607 Nontraumatic subarachnoid hemorrhage from unspecified intracranial artery: Secondary | ICD-10-CM

## 2020-07-31 NOTE — Therapy (Signed)
[place] MAIN [facility] SERVICES [address] [place], [place], [address] Phone: [phone]   Fax:  [phone]  Occupational Therapy Treatment  Patient Details  Name: Erika Stanton MRN: [id number] Date of Birth: [birth year] Referring Provider (OT): Dr. Naaman Plummer   Encounter Date: 07/27/2020   OT End of Session - 07/31/20 1416    Visit Number 32    Number of Visits 60    Date for OT Re-Evaluation 08/30/20    Authorization Type Progress report period starting 06/29/2020    OT Start Time 0915    OT Stop Time 1000    OT Time Calculation (min) 45 min    Activity Tolerance Patient tolerated treatment well    Behavior During Therapy [facility] for tasks assessed/performed           Past Medical History:  Diagnosis Date  . Aneurysm ([place])   . Melena     Past Surgical History:  Procedure Laterality Date  . CHOLECYSTECTOMY    . COLONOSCOPY  11/2017   at [facility]. no recurrent polyps.  suggest repeat surveillance study 11/2022.    Marland Kitchen COLONOSCOPY W/ POLYPECTOMY  06/2014   Dr Roxy Manns at [facility].  3 adenomatoous polyps, anal fissure.    . ESOPHAGOGASTRODUODENOSCOPY (EGD) WITH PROPOFOL N/A 01/27/2020   Procedure: ESOPHAGOGASTRODUODENOSCOPY (EGD) WITH PROPOFOL;  Surgeon: Mauri Pole, MD;  Location: [place] [facility];  Service: Endoscopy;  Laterality: N/A;  . IR 3D INDEPENDENT WKST  01/06/2020  . IR ANGIO INTRA EXTRACRAN SEL INTERNAL CAROTID BILAT MOD SED  01/06/2020  . IR ANGIO VERTEBRAL SEL VERTEBRAL UNI L MOD SED  01/06/2020  . IR ANGIOGRAM FOLLOW UP STUDY  01/06/2020  . IR ANGIOGRAM FOLLOW UP STUDY  01/06/2020  . IR ANGIOGRAM FOLLOW UP STUDY  01/06/2020  . IR ANGIOGRAM FOLLOW UP STUDY  01/06/2020  . IR ANGIOGRAM FOLLOW UP STUDY  01/06/2020  . IR ANGIOGRAM FOLLOW UP STUDY  01/06/2020  . IR ANGIOGRAM FOLLOW UP STUDY  01/06/2020  . IR ANGIOGRAM FOLLOW UP STUDY  01/06/2020  . IR ANGIOGRAM FOLLOW UP STUDY  01/06/2020  . IR ANGIOGRAM FOLLOW UP STUDY  01/06/2020  . IR NEURO  EACH ADD'L AFTER BASIC UNI RIGHT (MS)  01/06/2020  . IR TRANSCATH/EMBOLIZ  01/06/2020  . RADIOLOGY WITH ANESTHESIA N/A 01/06/2020   Procedure: IR WITH ANESTHESIA FOR ANEURYSM;  Surgeon: Consuella Lose, MD;  Location: [place];  Service: Radiology;  Laterality: N/A;  . TUBAL LIGATION      There were no vitals filed for this visit.   Subjective Assessment - 07/31/20 1415    Subjective  Pt reports her surgery went well, she will have to have another surgery to go back and repair the first aneursym.    Pertinent History ICH 01/06/20, Aneurysm. PMH: OA bilateral knees and hands    Patient Stated Goals Pt would like to be as independent as possible.    Currently in Pain? Yes    Pain Score 2     Pain Location Arm    Pain Orientation Right    Pain Descriptors / Indicators Aching;Tender    Pain Type Chronic pain    Pain Onset More than a month ago    Pain Frequency Intermittent           Moist heat to right shoulder, forearm and hand for 5 mins prior to exercises to decrease pain and increase ROM.  Therapeutic Exercise: Shoulder shrugs, retraction and shoulder rolls active assist with therapist and  followed by AROM for each exercise with cues as neededperformed in sitting.  Pt seen for PROMof RUE, all joints and available planes of motionfrom a seated position, followed by AAROM with forward reaching, ABD, elbow flexion/extension, supination of forearm, wrist flexion/extension, digit extension with prolonged stretch. Reaching forwards to knee with right hand, fingers tend to stay in flexion.   Discussed using splint some during the day when she has down times.  She reports she normally uses it at night only.  Increased spasticity in right hand especially at small finger and ring finger.    Response to tx: Patient with increased spasticity in right hand which may be due to increased stress with her medical issues.  She also did not take her splint with her to the hospital to wear to  promote finger extension. Recommend she try using splint some during the day, only during rest and down times.  Continue to encourage right hand use at home and daily exercises.                    OT Education - 07/31/20 1416    Education Details ROM, pain control, strength, finger ROM, functional tasks at home    Person(s) Educated Patient    Methods Explanation;Demonstration;Verbal cues;Handout    Comprehension Verbalized understanding;Returned demonstration;Verbal cues required            OT Short Term Goals - 06/29/20 1217      OT SHORT TERM GOAL #4   Title Pt will demonstrate 30 A/ROM shoulder flexion for RUE with pain less than or equal to 4/10.    Baseline Pt. presents with limited isolated  right shoulder flexion with pain continues to be 2    Time 4    Period Weeks    Status Deferred      OT SHORT TERM GOAL #7   Title Pt will demonstrate at least 25% finger flexion/ extension in RUE in prep for functional use.    Baseline Limited PROM, and active digit flexion, and extension. Improved AROM for right 2nd, and 3rd digits. significant limitations with the 4th, and 5th digits.    Time 4    Period Weeks    Status Deferred             OT Long Term Goals - 06/29/20 1221      OT LONG TERM GOAL #1   Title Pt will perform  all basic ADLs with supervision    Baseline Pt. requires minA from her husband    Time 12    Period Weeks    Status On-going    Target Date 08/30/20      OT LONG TERM GOAL #2   Title Pt will demonstrate 45* RUE shoulder flexion in prep for functional reach with pain less than or equal to 3/10.    Baseline Pt. conitnues to present with limited AROM for isolated  right shoulder flexion with pain 2.    Time 12    Period Weeks    Status Partially Met    Target Date 08/30/20      OT LONG TERM GOAL #3   Title Pt will use RUE as a stabilizer/ gross A at least 30 % of the time for ADLs/ IADLs.    Baseline Pt. is using her right hand as a  gross stabilizer 15% of the time.    Time 12    Period Weeks    Status On-going    Target Date  08/30/20      OT LONG TERM GOAL #4   Title Pt will increase LUE grip strength to 25 lbs or greater for increased ease with ADLs.    Baseline Pt. has improved with left grip strength. Pt.continues to work towards 25#.    Time 12    Period Weeks    Status On-going    Target Date 08/30/20      OT LONG TERM GOAL #5   Title Pt will perform simple home management/ snack prep with min A.    Baseline Pt. conitnues to try to help when she can, however her husband mostly perfroms these tasks.    Time 12    Period Weeks    Status On-going    Target Date 08/30/20      OT LONG TERM GOAL #6   Title Pt will demonstrate ability to grasp/ release a cup 2/3 trials with RUE.    Baseline Pt. is initialing grasp, and active release of objects, however is unable to grasp/release a cup.    Time 12    Period Weeks    Status On-going    Target Date 08/30/20      OT LONG TERM GOAL #8   Title I with updated HEP.    Baseline Pt. requires assist, and cuing for HEP.    Time 12    Period Weeks    Status On-going    Target Date 08/30/20      OT LONG TERM GOAL  #9   TITLE Pt. will use her right hand to assist the left hand in searching for items in, and holding her purse.    Baseline Pt. is unable to perform    Time 12    Period Weeks    Status On-going    Target Date 08/30/20      OT LONG TERM GOAL  #10   TITLE Pt. will use her right hand to assist the left to independently place eye glasses into a case.    Baseline Pt. has difficulty perfroming.    Time 12    Period Weeks    Status On-going    Target Date 08/30/20                 Plan - 07/31/20 1417    Clinical Impression Statement Patient with increased spasticity in right hand which may be due to increased stress with her medical issues.  She also did not take her splint with her to the hospital to wear to promote finger extension.  Recommend she try using splint some during the day, only during rest and down times.  Continue to encourage right hand use at home and daily exercises.    OT Occupational Profile and History Detailed Assessment- Review of Records and additional review of physical, cognitive, psychosocial history related to current functional performance    Occupational performance deficits (Please refer to evaluation for details): ADL's;IADL's;Rest and Sleep;Work;Play;Leisure    Body Structure / Function / Physical Skills ADL;Balance;Coordination;Decreased knowledge of precautions;Decreased knowledge of use of DME;Dexterity;Edema;Mobility;Tone;Strength;IADL;Sensation;GMC;Gait;ROM;FMC;Flexibility;Pain;Vision;UE functional use;Endurance    Cognitive Skills Attention;Perception;Problem Solve;Safety Awareness;Thought    Rehab Potential Good    Clinical Decision Making Several treatment options, min-mod task modification necessary    Comorbidities Affecting Occupational Performance: None    Modification or Assistance to Complete Evaluation  Min-Moderate modification of tasks or assist with assess necessary to complete eval    OT Frequency 3x / week    OT Duration 12 weeks  OT Treatment/Interventions Self-care/ADL training;Ultrasound;Energy conservation;Visual/perceptual remediation/compensation;Patient/family education;DME and/or AE instruction;Aquatic Therapy;Paraffin;Gait Training;Passive range of motion;Balance training;Fluidtherapy;Electrical Stimulation;Functional Mobility Training;Splinting;Moist Heat;Therapeutic exercise;Manual Therapy;Cognitive remediation/compensation;Manual lymph drainage;Neuromuscular education;Coping strategies training    Consulted and Agree with Plan of Care Patient;Family member/caregiver           Patient will benefit from skilled therapeutic intervention in order to improve the following deficits and impairments:   Body Structure / Function / Physical Skills:  ADL,Balance,Coordination,Decreased knowledge of precautions,Decreased knowledge of use of DME,Dexterity,Edema,Mobility,Tone,Strength,IADL,Sensation,GMC,Gait,ROM,FMC,Flexibility,Pain,Vision,UE functional use,Endurance Cognitive Skills: Attention,Perception,Problem Solve,Safety Awareness,Thought     Visit Diagnosis: Muscle weakness (generalized)  Other lack of coordination  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 Dyspareunia in female 04/05/2020  . History of abnormal cervical Pap smear 03/10/2020  . GIB (gastrointestinal bleeding) 02/28/2020  . Elevated BUN   . Prediabetes   . 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[place]) 01/20/2020  . Cerebral vasospasm   . Sinus tachycardia   . Dysphagia, post-stroke   . Thrombocytopenia ([place])   . Acute blood loss anemia   . Brain aneurysm   . ICH (intracerebral hemorrhage) ([place]) 01/05/2020  . History of adenomatous polyp of colon 02/07/2016  . 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 Fissure in ano 11/11/2014  . Hemorrhoids 11/11/2014  . Constipation 11/19/2013  . GERD (gastroesophageal reflux disease) 03/24/2013  . Irritable bowel syndrome with diarrhea 03/24/2013  . Herpes zoster 05/14/2005  . Abnormal Pap smear of cervix 05/15/1983  . History of cervical dysplasia 05/15/1983   Achilles Dunk, OTR/L, CLT Felton Buczynski 07/31/2020, 2:25 PM  [place] MAIN [facility] SERVICES [address] [place], [place], [address] Phone: [phone]   Fax:  [phone]  Name: Erika Stanton MRN: [id number] Date of Birth: Apr 07, 1956

## 2020-07-31 NOTE — Therapy (Signed)
[place] MAIN [facility] SERVICES [address] [place], [place], [address] Phone: [phone]   Fax:  [phone]  Occupational Therapy Treatment  Patient Details  Name: Erika Stanton MRN: [id number] Date of Birth: 30-Dec-1955 Referring Provider (OT): Dr. Naaman Plummer   Encounter Date: 07/29/2020   OT End of Session - 07/31/20 1542    Visit Number 42    Date for OT Re-Evaluation 08/30/20    Authorization Type Progress report period starting 06/29/2020    Authorization Time [place]    OT Start Time 1015    OT Stop Time 1100    OT Time Calculation (min) 45 min           Past Medical History:  Diagnosis Date  . Aneurysm (Section)   . Melena     Past Surgical History:  Procedure Laterality Date  . CHOLECYSTECTOMY    . COLONOSCOPY  11/2017   at [facility]. no recurrent polyps.  suggest repeat surveillance study 11/2022.    Marland Kitchen COLONOSCOPY W/ POLYPECTOMY  06/2014   Dr Roxy Manns at [facility].  3 adenomatoous polyps, anal fissure.    . ESOPHAGOGASTRODUODENOSCOPY (EGD) WITH PROPOFOL N/A 01/27/2020   Procedure: ESOPHAGOGASTRODUODENOSCOPY (EGD) WITH PROPOFOL;  Surgeon: Mauri Pole, MD;  Location: [facility];  Service: Endoscopy;  Laterality: N/A;  . IR 3D INDEPENDENT WKST  01/06/2020  . IR ANGIO INTRA EXTRACRAN SEL INTERNAL CAROTID BILAT MOD SED  01/06/2020  . IR ANGIO VERTEBRAL SEL VERTEBRAL UNI L MOD SED  01/06/2020  . IR ANGIOGRAM FOLLOW UP STUDY  01/06/2020  . IR ANGIOGRAM FOLLOW UP STUDY  01/06/2020  . IR ANGIOGRAM FOLLOW UP STUDY  01/06/2020  . IR ANGIOGRAM FOLLOW UP STUDY  01/06/2020  . IR ANGIOGRAM FOLLOW UP STUDY  01/06/2020  . IR ANGIOGRAM FOLLOW UP STUDY  01/06/2020  . IR ANGIOGRAM FOLLOW UP STUDY  01/06/2020  . IR ANGIOGRAM FOLLOW UP STUDY  01/06/2020  . IR ANGIOGRAM FOLLOW UP STUDY  01/06/2020  . IR ANGIOGRAM FOLLOW UP STUDY  01/06/2020  . IR NEURO EACH ADD'L AFTER BASIC UNI RIGHT (MS)  01/06/2020  . IR TRANSCATH/EMBOLIZ  01/06/2020  . RADIOLOGY  WITH ANESTHESIA N/A 01/06/2020   Procedure: IR WITH ANESTHESIA FOR ANEURYSM;  Surgeon: Consuella Lose, MD;  Location: [facility];  Service: Radiology;  Laterality: N/A;  . TUBAL LIGATION      There were no vitals filed for this visit.   Subjective Assessment - 07/31/20 1541    Subjective  Patient reports her pain is about a 1-2 when not moving, shoulder isn't as sore.  Used her right hand to feed self [employer] cookies.    Pertinent History ICH 01/06/20, Aneurysm. PMH: OA bilateral knees and hands    Patient Stated Goals Pt would like to be as independent as possible.    Currently in Pain? Yes    Pain Score 2     Pain Location Arm    Pain Orientation Right    Pain Descriptors / Indicators Aching;Tender    Pain Type Chronic pain    Pain Onset More than a month ago    Pain Frequency Intermittent            Moist heat to right shoulder, forearm and hand for 5 mins prior to exercises to decrease pain and increase ROM.  Therapeutic Exercise: Shoulder shrugs, retraction and shoulder rolls active assist with therapist and followed by AROM for each exercise with cues as neededperformed in sitting.  Pt seen for PROMof RUE, all joints and available planes of motionfrom a seated position, followed by AAROM with forward reaching, ABD, elbow flexion/extension, supination of forearm, wrist flexion/extension, digit extension with prolonged stretch. Reaching forwards to knee with right hand then to floor to decrease spasticity.  Assist to keep fingers in extended position during ROM.  Wrist extension exercises for 5 reps for 2 sets with cues, fingers in flexion during exercises.  Gross grasp and release of items greater than 1 inch in size, assist at times for release of object.  UBE for 5  mins in standing with therapist providing guiding with right hand to ensure grip on handle during whole time.  No resistance added this date, just working on motion.    BP monitored during session  122/78  Response to tx:       Patient continues to demonstrate increased spasticity and digits tend to stay in flexion.  Encouraged patient to try splint during the day during rest times, she did not do this over the past few days but planning to do over the weekend.  Pain decreased at rest, increased with motion of the right arm and hand.  Continue to reinforce the importance of daily exercises with HEP as well as PROM to hand.  Husband has been helping patient at home with exercises.  Continue to progress towards goals in plan of care to maximize safety and independence with ADLs and IADLs.                    OT Education - 07/31/20 1541    Education Details ROM, pain control, strength, finger ROM, functional tasks at home    Person(s) Educated Patient    Methods Explanation;Demonstration;Verbal cues;Handout    Comprehension Verbalized understanding;Returned demonstration;Verbal cues required            OT Short Term Goals - 06/29/20 1217      OT SHORT TERM GOAL #4   Title Pt will demonstrate 30 A/ROM shoulder flexion for RUE with pain less than or equal to 4/10.    Baseline Pt. presents with limited isolated  right shoulder flexion with pain continues to be 2    Time 4    Period Weeks    Status Deferred      OT SHORT TERM GOAL #7   Title Pt will demonstrate at least 25% finger flexion/ extension in RUE in prep for functional use.    Baseline Limited PROM, and active digit flexion, and extension. Improved AROM for right 2nd, and 3rd digits. significant limitations with the 4th, and 5th digits.    Time 4    Period Weeks    Status Deferred             OT Long Term Goals - 06/29/20 1221      OT LONG TERM GOAL #1   Title Pt will perform  all basic ADLs with supervision    Baseline Pt. requires minA from her husband    Time 12    Period Weeks    Status On-going    Target Date 08/30/20      OT LONG TERM GOAL #2   Title Pt will demonstrate 45* RUE shoulder  flexion in prep for functional reach with pain less than or equal to 3/10.    Baseline Pt. conitnues to present with limited AROM for isolated  right shoulder flexion with pain 2.    Time 12    Period Weeks  Status Partially Met    Target Date 08/30/20      OT LONG TERM GOAL #3   Title Pt will use RUE as a stabilizer/ gross A at least 30 % of the time for ADLs/ IADLs.    Baseline Pt. is using her right hand as a gross stabilizer 15% of the time.    Time 12    Period Weeks    Status On-going    Target Date 08/30/20      OT LONG TERM GOAL #4   Title Pt will increase LUE grip strength to 25 lbs or greater for increased ease with ADLs.    Baseline Pt. has improved with left grip strength. Pt.continues to work towards 25#.    Time 12    Period Weeks    Status On-going    Target Date 08/30/20      OT LONG TERM GOAL #5   Title Pt will perform simple home management/ snack prep with min A.    Baseline Pt. conitnues to try to help when she can, however her husband mostly perfroms these tasks.    Time 12    Period Weeks    Status On-going    Target Date 08/30/20      OT LONG TERM GOAL #6   Title Pt will demonstrate ability to grasp/ release a cup 2/3 trials with RUE.    Baseline Pt. is initialing grasp, and active release of objects, however is unable to grasp/release a cup.    Time 12    Period Weeks    Status On-going    Target Date 08/30/20      OT LONG TERM GOAL #8   Title I with updated HEP.    Baseline Pt. requires assist, and cuing for HEP.    Time 12    Period Weeks    Status On-going    Target Date 08/30/20      OT LONG TERM GOAL  #9   TITLE Pt. will use her right hand to assist the left hand in searching for items in, and holding her purse.    Baseline Pt. is unable to perform    Time 12    Period Weeks    Status On-going    Target Date 08/30/20      OT LONG TERM GOAL  #10   TITLE Pt. will use her right hand to assist the left to independently place eye  glasses into a case.    Baseline Pt. has difficulty perfroming.    Time 12    Period Weeks    Status On-going    Target Date 08/30/20                 Plan - 07/31/20 1543    Clinical Impression Statement Patient continues to demonstrate increased spasticity and digits tend to stay in flexion.  Encouraged patient to try splint during the day during rest times, she did not do this over the past few days but planning to do over the weekend.  Pain decreased at rest, increased with motion of the right arm and hand.  Continue to reinforce the importance of daily exercises with HEP as well as PROM to hand.  Husband has been helping patient at home with exercises.  Continue to progress towards goals in plan of care to maximize safety and independence with ADLs and IADLs.    OT Occupational Profile and History Detailed Assessment- Review of Records and additional review of physical, cognitive,  psychosocial history related to current functional performance    Occupational performance deficits (Please refer to evaluation for details): ADL's;IADL's;Rest and Sleep;Work;Play;Leisure    Body Structure / Function / Physical Skills ADL;Balance;Coordination;Decreased knowledge of precautions;Decreased knowledge of use of DME;Dexterity;Edema;Mobility;Tone;Strength;IADL;Sensation;GMC;Gait;ROM;FMC;Flexibility;Pain;Vision;UE functional use;Endurance    Cognitive Skills Attention;Perception;Problem Solve;Safety Awareness;Thought    Rehab Potential Good    Clinical Decision Making Several treatment options, min-mod task modification necessary    Comorbidities Affecting Occupational Performance: None    Modification or Assistance to Complete Evaluation  Min-Moderate modification of tasks or assist with assess necessary to complete eval    OT Frequency 3x / week    OT Duration 12 weeks    OT Treatment/Interventions Self-care/ADL training;Ultrasound;Energy conservation;Visual/perceptual  remediation/compensation;Patient/family education;DME and/or AE instruction;Aquatic Therapy;Paraffin;Gait Training;Passive range of motion;Balance training;Fluidtherapy;Electrical Stimulation;Functional Mobility Training;Splinting;Moist Heat;Therapeutic exercise;Manual Therapy;Cognitive remediation/compensation;Manual lymph drainage;Neuromuscular education;Coping strategies training    Consulted and Agree with Plan of Care Patient;Family member/caregiver           Patient will benefit from skilled therapeutic intervention in order to improve the following deficits and impairments:   Body Structure / Function / Physical Skills: ADL,Balance,Coordination,Decreased knowledge of precautions,Decreased knowledge of use of DME,Dexterity,Edema,Mobility,Tone,Strength,IADL,Sensation,GMC,Gait,ROM,FMC,Flexibility,Pain,Vision,UE functional use,Endurance Cognitive Skills: Attention,Perception,Problem Solve,Safety Awareness,Thought     Visit Diagnosis: Muscle weakness (generalized)  Other lack of coordination  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 Dyspareunia in female 04/05/2020  . History of abnormal cervical Pap smear 03/10/2020  . GIB (gastrointestinal bleeding) 02/28/2020  . Elevated BUN   . Prediabetes   . 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 Cerebral vasospasm   . Sinus tachycardia   . Dysphagia, post-stroke   . Thrombocytopenia ([place])   . Acute blood loss anemia   . Brain aneurysm   . ICH (intracerebral hemorrhage) ([place]) 01/05/2020  . History of adenomatous polyp of colon 02/07/2016  . 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 Fissure in ano 11/11/2014  . Hemorrhoids 11/11/2014  . Constipation 11/19/2013  . GERD (gastroesophageal reflux disease) 03/24/2013  . Irritable bowel syndrome with diarrhea  03/24/2013  . Herpes zoster 05/14/2005  . Abnormal Pap smear of cervix 05/15/1983  . History of cervical dysplasia 05/15/1983   Achilles Dunk, OTR/L, CLT  Lovett,Amy 07/31/2020, 4:31 PM  [place] MAIN [facility] SERVICES [address] [place], [place], [address] Phone: [phone]   Fax:  [phone]  Name: Erika Stanton MRN: [id number] Date of Birth: August 05, 1955

## 2020-08-01 ENCOUNTER — Other Ambulatory Visit: Payer: Self-pay

## 2020-08-01 ENCOUNTER — Ambulatory Visit: Payer: BC Managed Care – PPO | Admitting: Occupational Therapy

## 2020-08-01 DIAGNOSIS — I69159 Hemiplegia and hemiparesis following nontraumatic intracerebral hemorrhage affecting unspecified side: Secondary | ICD-10-CM

## 2020-08-01 DIAGNOSIS — R278 Other lack of coordination: Secondary | ICD-10-CM

## 2020-08-01 DIAGNOSIS — M6281 Muscle weakness (generalized): Secondary | ICD-10-CM | POA: Diagnosis not present

## 2020-08-02 ENCOUNTER — Ambulatory Visit: Payer: BC Managed Care – PPO | Admitting: Occupational Therapy

## 2020-08-02 ENCOUNTER — Encounter: Payer: Self-pay | Admitting: Occupational Therapy

## 2020-08-02 ENCOUNTER — Encounter: Payer: BC Managed Care – PPO | Admitting: Speech Pathology

## 2020-08-02 DIAGNOSIS — M6281 Muscle weakness (generalized): Secondary | ICD-10-CM

## 2020-08-02 DIAGNOSIS — R278 Other lack of coordination: Secondary | ICD-10-CM

## 2020-08-02 NOTE — Therapy (Signed)
[place] MAIN [facility] SERVICES [address] [place], [place], [address] Phone: [phone]   Fax:  [phone]  Occupational Therapy Treatment  Patient Details  Name: Erika Stanton MRN: [id number] Date of Birth: Aug 26, 1955 Referring Provider (OT): Dr. Naaman Plummer   Encounter Date: 08/01/2020   OT End of Session - 08/01/20 1206    Visit Number 39    Number of Visits 57    Date for OT Re-Evaluation 08/30/20    Authorization Type Progress report period starting 06/29/2020    Authorization Time Period State [employer]    OT Start Time 0930    OT Stop Time 1015    OT Time Calculation (min) 45 min    Activity Tolerance Patient tolerated treatment well    Behavior During Therapy [facility] for tasks assessed/performed           Past Medical History:  Diagnosis Date  . Aneurysm ([place])   . Melena     Past Surgical History:  Procedure Laterality Date  . CHOLECYSTECTOMY    . COLONOSCOPY  11/2017   at [facility]. no recurrent polyps.  suggest repeat surveillance study 11/2022.    Marland Kitchen COLONOSCOPY W/ POLYPECTOMY  06/2014   Dr Roxy Manns at [facility].  3 adenomatoous polyps, anal fissure.    . ESOPHAGOGASTRODUODENOSCOPY (EGD) WITH PROPOFOL N/A 01/27/2020   Procedure: ESOPHAGOGASTRODUODENOSCOPY (EGD) WITH PROPOFOL;  Surgeon: Mauri Pole, MD;  Location: [place] ENDOSCOPY;  Service: Endoscopy;  Laterality: N/A;  . IR 3D INDEPENDENT WKST  01/06/2020  . IR ANGIO INTRA EXTRACRAN SEL INTERNAL CAROTID BILAT MOD SED  01/06/2020  . IR ANGIO VERTEBRAL SEL VERTEBRAL UNI L MOD SED  01/06/2020  . IR ANGIOGRAM FOLLOW UP STUDY  01/06/2020  . IR ANGIOGRAM FOLLOW UP STUDY  01/06/2020  . IR ANGIOGRAM FOLLOW UP STUDY  01/06/2020  . IR ANGIOGRAM FOLLOW UP STUDY  01/06/2020  . IR ANGIOGRAM FOLLOW UP STUDY  01/06/2020  . IR ANGIOGRAM FOLLOW UP STUDY  01/06/2020  . IR ANGIOGRAM FOLLOW UP STUDY  01/06/2020  . IR ANGIOGRAM FOLLOW UP STUDY  01/06/2020  . IR ANGIOGRAM FOLLOW UP STUDY  01/06/2020  . IR  ANGIOGRAM FOLLOW UP STUDY  01/06/2020  . IR NEURO EACH ADD'L AFTER BASIC UNI RIGHT (MS)  01/06/2020  . IR TRANSCATH/EMBOLIZ  01/06/2020  . RADIOLOGY WITH ANESTHESIA N/A 01/06/2020   Procedure: IR WITH ANESTHESIA FOR ANEURYSM;  Surgeon: Consuella Lose, MD;  Location: [place];  Service: Radiology;  Laterality: N/A;  . TUBAL LIGATION      There were no vitals filed for this visit.   Subjective Assessment - 08/02/20 1203    Pertinent History ICH 01/06/20, Aneurysm. PMH: OA bilateral knees and hands    Patient Stated Goals Pt would like to be as independent as possible.    Currently in Pain? Yes    Pain Score 2     Pain Location Arm    Pain Orientation Right    Pain Descriptors / Indicators Aching    Pain Type Chronic pain    Pain Onset More than a month ago    Pain Frequency Intermittent          Moist heat to right shoulder, forearm and hand prior to treatment session to decrease pain and increase ROM, tissue elasticity.   Pt reports she did add wearing the splint during the day during her nap time to help extend fingers.  She has been using right hand to feed herself snacks but  not yet using a utensil.     Therapeutic Exercise: Shoulder shrugs, retraction and shoulder rolls active assist with therapist and followed by AROM for each exercise with cues as neededperformed in sitting.  Pt seen for PROMof RUE, all joints and available planes of motionfrom a seated position, followed by AAROM with forward reaching, ABD, elbow flexion/extension, supination of forearm, wrist flexion/extension, digit extension with prolonged stretch. Reaching forwards to knee with right hand, fingers tend to stay in flexion.  Hand to mouth patterns for increased repetitions and multiple trials.  Reaching to tabletop to grasp [place] discs then working to release and drop into a bucket on floor between her legs.  She does not have enough isolated finger movements yet to flip discs to opposite side.  UBE in  standing for 5 mins with no resistance with therapist in constant attendance to provide hand over hand for the right side grip.    Response to tx:   Pt did add additional time with splint wear with resting hand splint during the day when napping as well as wearing during the nighttime.  Pt continues to demonstrate increased spasticity in the right hand and digits and ring and small finger tend to stay in flexed position with all joints placing her at risk for potential contractures.  Pt more consistent with release of objects in downward direction when there is decreased demand on wrist extension.  Patient continues to work on HEP with assistance from husband at home on a daily basis.  She did discuss Botox with MD recently and is considering injections to help manage spasticity.  Continue to work towards goals in plan of care to maximize safety and independence in ADL and IADL tasks at home and in the community.                        OT Education - 08/02/20 1203    Education Details ROM, pain control, strength, finger ROM, functional tasks at home    Person(s) Educated Patient    Methods Explanation;Demonstration;Verbal cues;Handout    Comprehension Verbalized understanding;Returned demonstration;Verbal cues required            OT Short Term Goals - 06/29/20 1217      OT SHORT TERM GOAL #4   Title Pt will demonstrate 30 A/ROM shoulder flexion for RUE with pain less than or equal to 4/10.    Baseline Pt. presents with limited isolated  right shoulder flexion with pain continues to be 2    Time 4    Period Weeks    Status Deferred      OT SHORT TERM GOAL #7   Title Pt will demonstrate at least 25% finger flexion/ extension in RUE in prep for functional use.    Baseline Limited PROM, and active digit flexion, and extension. Improved AROM for right 2nd, and 3rd digits. significant limitations with the 4th, and 5th digits.    Time 4    Period Weeks    Status Deferred              OT Long Term Goals - 06/29/20 1221      OT LONG TERM GOAL #1   Title Pt will perform  all basic ADLs with supervision    Baseline Pt. requires minA from her husband    Time 12    Period Weeks    Status On-going    Target Date 08/30/20      OT LONG TERM  GOAL #2   Title Pt will demonstrate 45* RUE shoulder flexion in prep for functional reach with pain less than or equal to 3/10.    Baseline Pt. conitnues to present with limited AROM for isolated  right shoulder flexion with pain 2.    Time 12    Period Weeks    Status Partially Met    Target Date 08/30/20      OT LONG TERM GOAL #3   Title Pt will use RUE as a stabilizer/ gross A at least 30 % of the time for ADLs/ IADLs.    Baseline Pt. is using her right hand as a gross stabilizer 15% of the time.    Time 12    Period Weeks    Status On-going    Target Date 08/30/20      OT LONG TERM GOAL #4   Title Pt will increase LUE grip strength to 25 lbs or greater for increased ease with ADLs.    Baseline Pt. has improved with left grip strength. Pt.continues to work towards 25#.    Time 12    Period Weeks    Status On-going    Target Date 08/30/20      OT LONG TERM GOAL #5   Title Pt will perform simple home management/ snack prep with min A.    Baseline Pt. conitnues to try to help when she can, however her husband mostly perfroms these tasks.    Time 12    Period Weeks    Status On-going    Target Date 08/30/20      OT LONG TERM GOAL #6   Title Pt will demonstrate ability to grasp/ release a cup 2/3 trials with RUE.    Baseline Pt. is initialing grasp, and active release of objects, however is unable to grasp/release a cup.    Time 12    Period Weeks    Status On-going    Target Date 08/30/20      OT LONG TERM GOAL #8   Title I with updated HEP.    Baseline Pt. requires assist, and cuing for HEP.    Time 12    Period Weeks    Status On-going    Target Date 08/30/20      OT LONG TERM GOAL  #9   TITLE  Pt. will use her right hand to assist the left hand in searching for items in, and holding her purse.    Baseline Pt. is unable to perform    Time 12    Period Weeks    Status On-going    Target Date 08/30/20      OT LONG TERM GOAL  #10   TITLE Pt. will use her right hand to assist the left to independently place eye glasses into a case.    Baseline Pt. has difficulty perfroming.    Time 12    Period Weeks    Status On-going    Target Date 08/30/20                 Plan - 08/02/20 1207    Clinical Impression Statement Pt did add additional time with splint wear with resting hand splint during the day when napping as well as wearing during the nighttime.  Pt continues to demonstrate increased spasticity in the right hand and digits and ring and small finger tend to stay in flexed position with all joints placing her at risk for potential contractures.  Pt more consistent with  release of objects in downward direction when there is decreased demand on wrist extension.  Patient continues to work on HEP with assistance from husband at home on a daily basis.  She did discuss Botox with MD recently and is considering injections to help manage spasticity.  Continue to work towards goals in plan of care to maximize safety and independence in ADL and IADL tasks at home and in the community.    OT Occupational Profile and History Detailed Assessment- Review of Records and additional review of physical, cognitive, psychosocial history related to current functional performance    Occupational performance deficits (Please refer to evaluation for details): ADL's;IADL's;Rest and Sleep;Work;Play;Leisure    Body Structure / Function / Physical Skills ADL;Balance;Coordination;Decreased knowledge of precautions;Decreased knowledge of use of DME;Dexterity;Edema;Mobility;Tone;Strength;IADL;Sensation;GMC;Gait;ROM;FMC;Flexibility;Pain;Vision;UE functional use;Endurance    Cognitive Skills  Attention;Perception;Problem Solve;Safety Awareness;Thought    Rehab Potential Good    Clinical Decision Making Several treatment options, min-mod task modification necessary    Comorbidities Affecting Occupational Performance: None    Modification or Assistance to Complete Evaluation  Min-Moderate modification of tasks or assist with assess necessary to complete eval    OT Frequency 3x / week    OT Duration 12 weeks    OT Treatment/Interventions Self-care/ADL training;Ultrasound;Energy conservation;Visual/perceptual remediation/compensation;Patient/family education;DME and/or AE instruction;Aquatic Therapy;Paraffin;Gait Training;Passive range of motion;Balance training;Fluidtherapy;Electrical Stimulation;Functional Mobility Training;Splinting;Moist Heat;Therapeutic exercise;Manual Therapy;Cognitive remediation/compensation;Manual lymph drainage;Neuromuscular education;Coping strategies training           Patient will benefit from skilled therapeutic intervention in order to improve the following deficits and impairments:   Body Structure / Function / Physical Skills: ADL,Balance,Coordination,Decreased knowledge of precautions,Decreased knowledge of use of DME,Dexterity,Edema,Mobility,Tone,Strength,IADL,Sensation,GMC,Gait,ROM,FMC,Flexibility,Pain,Vision,UE functional use,Endurance Cognitive Skills: Attention,Perception,Problem Solve,Safety Awareness,Thought     Visit Diagnosis: Muscle weakness (generalized)  Other lack of coordination  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 Dyspareunia in female 04/05/2020  . History of abnormal cervical Pap smear 03/10/2020  . GIB (gastrointestinal bleeding) 02/28/2020  . Elevated BUN   . Prediabetes   . 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 Cerebral vasospasm   . Sinus tachycardia   . Dysphagia, post-stroke    . Thrombocytopenia ([place])   . Acute blood loss anemia   . Brain aneurysm   . ICH (intracerebral hemorrhage) ([place]) 01/05/2020  . History of adenomatous polyp of colon 02/07/2016  . 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 Fissure in ano 11/11/2014  . Hemorrhoids 11/11/2014  . Constipation 11/19/2013  . GERD (gastroesophageal reflux disease) 03/24/2013  . Irritable bowel syndrome with diarrhea 03/24/2013  . Herpes zoster 05/14/2005  . Abnormal Pap smear of cervix 05/15/1983  . History of cervical dysplasia 05/15/1983   Achilles Dunk, OTR/L, CLT  Dakisha Schoof 08/02/2020, 12:41 PM  [place] MAIN [facility] SERVICES [address] [place], [place], [address] Phone: [phone]   Fax:  [phone]  Name: Erika Stanton MRN: [id number] Date of Birth: 08-24-1955

## 2020-08-02 NOTE — Therapy (Signed)
[place] MAIN [facility] SERVICES [address] [place], [place], [address] Phone: [phone]   Fax:  [phone]  Occupational Therapy Treatment  Patient Details  Name: Erika Stanton MRN: [id number] Date of Birth: 16-Mar-1956 Referring Provider (OT): Dr. Naaman Plummer   Encounter Date: 08/02/2020   OT End of Session - 08/02/20 1353    Visit Number 44    Number of Visits 48    Date for OT Re-Evaluation 08/30/20    Authorization Type Progress report period starting 06/29/2020    Activity Tolerance Patient tolerated treatment well    Behavior During Therapy [facility] for tasks assessed/performed           Past Medical History:  Diagnosis Date  . Aneurysm ([place])   . Melena     Past Surgical History:  Procedure Laterality Date  . CHOLECYSTECTOMY    . COLONOSCOPY  11/2017   at [facility]. no recurrent polyps.  suggest repeat surveillance study 11/2022.    Marland Kitchen COLONOSCOPY W/ POLYPECTOMY  06/2014   Dr Roxy Manns at [facility].  3 adenomatoous polyps, anal fissure.    . ESOPHAGOGASTRODUODENOSCOPY (EGD) WITH PROPOFOL N/A 01/27/2020   Procedure: ESOPHAGOGASTRODUODENOSCOPY (EGD) WITH PROPOFOL;  Surgeon: Mauri Pole, MD;  Location: [place] ENDOSCOPY;  Service: Endoscopy;  Laterality: N/A;  . IR 3D INDEPENDENT WKST  01/06/2020  . IR ANGIO INTRA EXTRACRAN SEL INTERNAL CAROTID BILAT MOD SED  01/06/2020  . IR ANGIO VERTEBRAL SEL VERTEBRAL UNI L MOD SED  01/06/2020  . IR ANGIOGRAM FOLLOW UP STUDY  01/06/2020  . IR ANGIOGRAM FOLLOW UP STUDY  01/06/2020  . IR ANGIOGRAM FOLLOW UP STUDY  01/06/2020  . IR ANGIOGRAM FOLLOW UP STUDY  01/06/2020  . IR ANGIOGRAM FOLLOW UP STUDY  01/06/2020  . IR ANGIOGRAM FOLLOW UP STUDY  01/06/2020  . IR ANGIOGRAM FOLLOW UP STUDY  01/06/2020  . IR ANGIOGRAM FOLLOW UP STUDY  01/06/2020  . IR ANGIOGRAM FOLLOW UP STUDY  01/06/2020  . IR ANGIOGRAM FOLLOW UP STUDY  01/06/2020  . IR NEURO EACH ADD'L AFTER BASIC UNI RIGHT (MS)  01/06/2020  . IR TRANSCATH/EMBOLIZ   01/06/2020  . RADIOLOGY WITH ANESTHESIA N/A 01/06/2020   Procedure: IR WITH ANESTHESIA FOR ANEURYSM;  Surgeon: Consuella Lose, MD;  Location: [place];  Service: Radiology;  Laterality: N/A;  . TUBAL LIGATION      There were no vitals filed for this visit.   Subjective Assessment - 08/02/20 1351    Subjective  Patient reports that she is uses her RUE to feed herself cookies.    Pertinent History ICH 01/06/20, Aneurysm. PMH: OA bilateral knees and hands    Currently in Pain? Yes    Pain Score 2     Pain Location Shoulder    Pain Orientation Right    Pain Descriptors / Indicators Aching          OT TREATMENT   Therapeutic Exercise:  Pt. was able to tolerate AAROM/PROM for shoulder flexion, abduction, elbow flexion, elbow extension, AAROM supinationPROM for right wrist, digit MP, PIP, and DIP flexion, and extension, AROM digit MP, PIP, and DIP flexion, extension.Thumb radial, and palmar abduction, Thumb IP flexion, and extensionin preparation for grasping objects.Education was provided about continuing to seek out opportunities to engage her hand more at home.  Neuromuscular re-ed:  Pt. worked on grasping 1" circular pegs from a pegboard placed at a flat surface, followed by an inclined surface. Emphasis was placed on releasing with digit extension, and thumb IP  extension. Pt.required assist, and support proximally.    Pt. continues  to engage her right UE during tasks at home, and continues to do home exercises with her husband. Pt. presented with increased thumb ROM with no reports of pain or discomfort with ROM. Pt. Is initiating active digit grasp, and release with her 2nd, and 3rd digit, and thumb however requires cues, and assist for positioning of her hand, and the item that she is grasping. Pt.continues to work on improving RUE ROM, and functional reaching in preparation for hand to face patterns for self-care tasks. Pt. Continues to work on improving RUE ROM, and  functional hand use during ADL tasks.                        OT Education - 08/02/20 1353    Education Details ROM, pain control, strength, finger ROM, functional tasks at home    Person(s) Educated Patient    Methods Explanation;Demonstration;Verbal cues;Handout    Comprehension Verbalized understanding;Returned demonstration;Verbal cues required            OT Short Term Goals - 06/29/20 1217      OT SHORT TERM GOAL #4   Title Pt will demonstrate 30 A/ROM shoulder flexion for RUE with pain less than or equal to 4/10.    Baseline Pt. presents with limited isolated  right shoulder flexion with pain continues to be 2    Time 4    Period Weeks    Status Deferred      OT SHORT TERM GOAL #7   Title Pt will demonstrate at least 25% finger flexion/ extension in RUE in prep for functional use.    Baseline Limited PROM, and active digit flexion, and extension. Improved AROM for right 2nd, and 3rd digits. significant limitations with the 4th, and 5th digits.    Time 4    Period Weeks    Status Deferred             OT Long Term Goals - 06/29/20 1221      OT LONG TERM GOAL #1   Title Pt will perform  all basic ADLs with supervision    Baseline Pt. requires minA from her husband    Time 12    Period Weeks    Status On-going    Target Date 08/30/20      OT LONG TERM GOAL #2   Title Pt will demonstrate 45* RUE shoulder flexion in prep for functional reach with pain less than or equal to 3/10.    Baseline Pt. conitnues to present with limited AROM for isolated  right shoulder flexion with pain 2.    Time 12    Period Weeks    Status Partially Met    Target Date 08/30/20      OT LONG TERM GOAL #3   Title Pt will use RUE as a stabilizer/ gross A at least 30 % of the time for ADLs/ IADLs.    Baseline Pt. is using her right hand as a gross stabilizer 15% of the time.    Time 12    Period Weeks    Status On-going    Target Date 08/30/20      OT LONG TERM  GOAL #4   Title Pt will increase LUE grip strength to 25 lbs or greater for increased ease with ADLs.    Baseline Pt. has improved with left grip strength. Pt.continues to work towards 25#.    Time  12    Period Weeks    Status On-going    Target Date 08/30/20      OT LONG TERM GOAL #5   Title Pt will perform simple home management/ snack prep with min A.    Baseline Pt. conitnues to try to help when she can, however her husband mostly perfroms these tasks.    Time 12    Period Weeks    Status On-going    Target Date 08/30/20      OT LONG TERM GOAL #6   Title Pt will demonstrate ability to grasp/ release a cup 2/3 trials with RUE.    Baseline Pt. is initialing grasp, and active release of objects, however is unable to grasp/release a cup.    Time 12    Period Weeks    Status On-going    Target Date 08/30/20      OT LONG TERM GOAL #8   Title I with updated HEP.    Baseline Pt. requires assist, and cuing for HEP.    Time 12    Period Weeks    Status On-going    Target Date 08/30/20      OT LONG TERM GOAL  #9   TITLE Pt. will use her right hand to assist the left hand in searching for items in, and holding her purse.    Baseline Pt. is unable to perform    Time 12    Period Weeks    Status On-going    Target Date 08/30/20      OT LONG TERM GOAL  #10   TITLE Pt. will use her right hand to assist the left to independently place eye glasses into a case.    Baseline Pt. has difficulty perfroming.    Time 12    Period Weeks    Status On-going    Target Date 08/30/20                 Plan - 08/02/20 1355    Clinical Impression Statement Pt. continues  to engage her right UE during tasks at home, and continues to do home exercises with her husband. Pt. presented with increased thumb ROM with no reports of pain or discomfort with ROM. Pt. Is initiating active digit grasp, and release with her 2nd, and 3rd digit, and thumb however requires cues, and assist for positioning  of her hand, and the item that she is grasping. Pt.continues to work on improving RUE ROM, and functional reaching in preparation for hand to face patterns for self-care tasks. Pt. Continues to work on improving RUE ROM, and functional hand use during ADL tasks.   OT Occupational Profile and History Detailed Assessment- Review of Records and additional review of physical, cognitive, psychosocial history related to current functional performance    Occupational performance deficits (Please refer to evaluation for details): ADL's;IADL's;Rest and Sleep;Work;Play;Leisure    Body Structure / Function / Physical Skills ADL;Balance;Coordination;Decreased knowledge of precautions;Decreased knowledge of use of DME;Dexterity;Edema;Mobility;Tone;Strength;IADL;Sensation;GMC;Gait;ROM;FMC;Flexibility;Pain;Vision;UE functional use;Endurance    Cognitive Skills Attention;Perception;Problem Solve;Safety Awareness;Thought    Rehab Potential Good    Clinical Decision Making Several treatment options, min-mod task modification necessary    Comorbidities Affecting Occupational Performance: None    Modification or Assistance to Complete Evaluation  Min-Moderate modification of tasks or assist with assess necessary to complete eval    OT Frequency 3x / week    OT Duration 12 weeks    OT Treatment/Interventions Self-care/ADL training;Ultrasound;Energy conservation;Visual/perceptual remediation/compensation;Patient/family education;DME and/or AE instruction;Aquatic  Therapy;Paraffin;Gait Training;Passive range of motion;Balance training;Fluidtherapy;Electrical Stimulation;Functional Mobility Training;Splinting;Moist Heat;Therapeutic exercise;Manual Therapy;Cognitive remediation/compensation;Manual lymph drainage;Neuromuscular education;Coping strategies training    Consulted and Agree with Plan of Care Patient;Family member/caregiver           Patient will benefit from skilled therapeutic intervention in order to improve  the following deficits and impairments:   Body Structure / Function / Physical Skills: ADL,Balance,Coordination,Decreased knowledge of precautions,Decreased knowledge of use of DME,Dexterity,Edema,Mobility,Tone,Strength,IADL,Sensation,GMC,Gait,ROM,FMC,Flexibility,Pain,Vision,UE functional use,Endurance Cognitive Skills: Attention,Perception,Problem Solve,Safety Awareness,Thought     Visit Diagnosis: Muscle weakness (generalized)  Other lack of coordination    Problem List Patient Active Problem List   Diagnosis Date Noted  . Dyspareunia in female 04/05/2020  . History of abnormal cervical Pap smear 03/10/2020  . GIB (gastrointestinal bleeding) 02/28/2020  . Elevated BUN   . Prediabetes   . 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 Cerebral vasospasm   . Sinus tachycardia   . Dysphagia, post-stroke   . Thrombocytopenia ([place])   . Acute blood loss anemia   . Brain aneurysm   . ICH (intracerebral hemorrhage) ([place]) 01/05/2020  . History of adenomatous polyp of colon 02/07/2016  . 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 Fissure in ano 11/11/2014  . Hemorrhoids 11/11/2014  . Constipation 11/19/2013  . GERD (gastroesophageal reflux disease) 03/24/2013  . Irritable bowel syndrome with diarrhea 03/24/2013  . Herpes zoster 05/14/2005  . Abnormal Pap smear of cervix 05/15/1983  . History of cervical dysplasia 05/15/1983    Harrel Carina, MS, OTR/L 08/02/2020, 1:58 PM  [place] MAIN [facility] SERVICES [address] [place], [place], [address] Phone: [phone]   Fax:  [phone]  Name: TARRAH FURUTA MRN: [id number] Date of Birth: 08-13-1955

## 2020-08-04 ENCOUNTER — Encounter: Payer: BC Managed Care – PPO | Admitting: Speech Pathology

## 2020-08-04 ENCOUNTER — Ambulatory Visit: Payer: BC Managed Care – PPO | Admitting: Occupational Therapy

## 2020-08-08 ENCOUNTER — Encounter: Payer: Self-pay | Admitting: Occupational Therapy

## 2020-08-08 ENCOUNTER — Other Ambulatory Visit: Payer: Self-pay

## 2020-08-08 ENCOUNTER — Ambulatory Visit: Payer: BC Managed Care – PPO | Admitting: Occupational Therapy

## 2020-08-08 DIAGNOSIS — I69159 Hemiplegia and hemiparesis following nontraumatic intracerebral hemorrhage affecting unspecified side: Secondary | ICD-10-CM

## 2020-08-08 DIAGNOSIS — M6281 Muscle weakness (generalized): Secondary | ICD-10-CM | POA: Diagnosis not present

## 2020-08-08 DIAGNOSIS — R278 Other lack of coordination: Secondary | ICD-10-CM

## 2020-08-08 NOTE — Therapy (Signed)
[place] MAIN [facility] SERVICES [address] [place], [place], [address] Phone: [phone]   Fax:  [phone]   Occupational Therapy Progress Note  Dates of reporting period 06/29/20   to   08/08/20  Patient Details  Name: Erika Stanton MRN: [id number] Date of Birth: 26-Oct-1955 Referring Provider (OT): Dr. Naaman Plummer   Encounter Date: 08/08/2020   OT End of Session - 08/08/20 1259    Visit Number 50    Number of Visits 60    Date for OT Re-Evaluation 08/30/20    Authorization Type Progress report period starting 08/08/2020    OT Start Time 1300    OT Stop Time 1345    OT Time Calculation (min) 45 min    Activity Tolerance Patient tolerated treatment well    Behavior During Therapy [facility] for tasks assessed/performed           Past Medical History:  Diagnosis Date  . Aneurysm ([place])   . Melena     Past Surgical History:  Procedure Laterality Date  . CHOLECYSTECTOMY    . COLONOSCOPY  11/2017   at [facility]. no recurrent polyps.  suggest repeat surveillance study 11/2022.    Marland Kitchen COLONOSCOPY W/ POLYPECTOMY  06/2014   Dr Roxy Manns at [facility].  3 adenomatoous polyps, anal fissure.    . ESOPHAGOGASTRODUODENOSCOPY (EGD) WITH PROPOFOL N/A 01/27/2020   Procedure: ESOPHAGOGASTRODUODENOSCOPY (EGD) WITH PROPOFOL;  Surgeon: Mauri Pole, MD;  Location: [place] [facility];  Service: Endoscopy;  Laterality: N/A;  . IR 3D INDEPENDENT WKST  01/06/2020  . IR ANGIO INTRA EXTRACRAN SEL INTERNAL CAROTID BILAT MOD SED  01/06/2020  . IR ANGIO VERTEBRAL SEL VERTEBRAL UNI L MOD SED  01/06/2020  . IR ANGIOGRAM FOLLOW UP STUDY  01/06/2020  . IR ANGIOGRAM FOLLOW UP STUDY  01/06/2020  . IR ANGIOGRAM FOLLOW UP STUDY  01/06/2020  . IR ANGIOGRAM FOLLOW UP STUDY  01/06/2020  . IR ANGIOGRAM FOLLOW UP STUDY  01/06/2020  . IR ANGIOGRAM FOLLOW UP STUDY  01/06/2020  . IR ANGIOGRAM FOLLOW UP STUDY  01/06/2020  . IR ANGIOGRAM FOLLOW UP STUDY  01/06/2020  . IR ANGIOGRAM FOLLOW UP STUDY  01/06/2020   . IR ANGIOGRAM FOLLOW UP STUDY  01/06/2020  . IR NEURO EACH ADD'L AFTER BASIC UNI RIGHT (MS)  01/06/2020  . IR TRANSCATH/EMBOLIZ  01/06/2020  . RADIOLOGY WITH ANESTHESIA N/A 01/06/2020   Procedure: IR WITH ANESTHESIA FOR ANEURYSM;  Surgeon: Consuella Lose, MD;  Location: [facility];  Service: Radiology;  Laterality: N/A;  . TUBAL LIGATION      There were no vitals filed for this visit.   Subjective Assessment - 08/08/20 1259    Subjective  Pt reports using her RUE to assist with opening her purse.    Pertinent History ICH 01/06/20, Aneurysm. PMH: OA bilateral knees and hands    Patient Stated Goals Pt would like to be as independent as possible.    Currently in Pain? Yes    Pain Score 2     Pain Location Shoulder    Pain Orientation Right    Pain Descriptors / Indicators Aching    Pain Onset More than a month ago    Pain Frequency Intermittent    Pain Relieving Factors rest/heat    Pain Onset More than a month ago              [facility] OT Assessment - 08/08/20 1643      Coordination   Left 9 Hole  Peg Test 32      Hand Function   Right Hand Gross Grasp Impaired    Left Hand Gross Grasp Functional    Left Hand Grip (lbs) 19    Left Hand Lateral Pinch 9 lbs    Left 3 point pinch 8 lbs            Self Care Pt required MIN cues to utilize R hand as stabilizer while managing large wallet/purse. Pt completed x1 trial each grasping small paper cup and heavier metal water bottle. Pt achieved grasping paper cup using L hand to stabilize, unable to release cup. Pt required increased time and L hand assist to grasp water bottle, unable to extend 5th digit for functional grasp.   Therapeutic Exercise Pt tolerated PROM in RUE and hand including shoulder flexion, elbow flexion, extension, forearm supination, wrist flexion, extension, and digit MP flexion, and extension. Pt presents with limited shoulder flexion, increased pain at ~40* flexion.       Measurements were obtained, and  goals were reviewed with the patient. Pt has made progress with left UE strength, grip strength, and [place] skills. Pt is improving with RUE ROM, and digit AROM. Pt is consistently using her right hand as a gross assist stabilizing items while using her left hand. Pt performs hand to face patterns with her right hand for self-feeding, however unable to reach top of head for grooming/dressing tasks. Pt presents with less pain during ROM over this past this past recertification period. Pt continues to require assist from her husband for meal preparation, self-dressing, and bimanual grooming tasks. Pt continues to present with limited RUE ROM, and limited AROM in the digits of her right hand with increased limitations in the 4th and 5th digits. Pt continues to need OT services to work on improving pain and increasing ROM in the RUE/hand in order to improve engagement of the RUE during basic ADL and IADL tasks.       OT Education - 08/08/20 1259    Education Details ROM, pain control, strength, finger ROM, functional tasks at home    Person(s) Educated Patient    Methods Explanation;Demonstration;Verbal cues;Handout    Comprehension Verbalized understanding;Returned demonstration;Verbal cues required            OT Short Term Goals - 08/08/20 1626      OT SHORT TERM GOAL #4   Title Pt will demonstrate 60* A/ROM shoulder flexion for RUE with pain less than or equal to 4/10.    Baseline Pt. presents with limited isolated  right shoulder flexion with pain continues to be 2. 08/08/20: 40* AROM with 3/10 pain    Time 4    Period Weeks    Status Revised    Target Date 09/05/20      OT SHORT TERM GOAL #5   Title Pt will demonstrate improved LUE fine motor coordination for ADLs as evidenced by decreasing 9 hole peg test score to 25 secs or less.    Baseline Left FMC 41 sec visa the 9 hole peg test. 08/08/20: 32 seconds via 9 hole peg    Time 6    Period Weeks    Status Revised      OT SHORT TERM GOAL  #7   Title Pt will demonstrate at least 25% finger flexion/ extension in RUE in prep for functional use.    Baseline Limited PROM, and active digit flexion, and extension. Improved AROM for right 2nd, and 3rd digits. significant limitations with the  4th, and 5th digits. 08/08/20: AROM digit MCP 2nd (80*), 3rd (85*), 4th (80*), 5th (90*)    Time 4    Period Weeks    Status On-going    Target Date 09/05/20             OT Long Term Goals - 08/08/20 1650      OT LONG TERM GOAL #1   Title Pt will perform all basic ADLs with supervision    Baseline Pt. requires minA from her husband. 08/08/20: Pt continues to require MIN A for grooming and dressing tasks.    Time 12    Period Weeks    Status On-going      OT LONG TERM GOAL #2   Title Pt will demonstrate 60* RUE shoulder flexion in prep for functional reach with pain less than or equal to 3/10.    Baseline Pt. conitnues to present with limited AROM for isolated  right shoulder flexion with pain 2. 08/08/20: R AROM Shoulder flexion - 45* c 3/10 pain.    Time 12    Period Weeks    Status Revised      OT LONG TERM GOAL #3   Title Pt will use RUE as a stabilizer/ gross A at least 30 % of the time for ADLs/ IADLs.    Baseline Pt. is using her right hand as a gross stabilizer 15% of the time. 08/08/20: requires MAX cues    Time 12    Period Weeks    Status On-going      OT LONG TERM GOAL #4   Title Pt will increase LUE grip strength to 25 lbs or greater for increased ease with ADLs.    Baseline Pt. has improved with left grip strength. Pt.continues to work towards 25#. 08/08/20: L grip 19#    Time 12    Period Weeks    Status On-going      OT LONG TERM GOAL #5   Title Pt will perform simple home management/ snack prep with min A.    Baseline Pt. conitnues to try to help when she can, however her husband mostly perfroms these tasks. 08/08/20: Husband performs    Time 12    Period Weeks    Status On-going      OT LONG TERM GOAL #6    Title Pt will demonstrate ability to grasp/ release a cup 2/3 trials with RUE.    Baseline Pt. is initialing grasp, and active release of objects, however is unable to grasp/release a cup. 08/08/20: Able to grasp, unable to release x1 trial    Time 12    Period Weeks    Status On-going      OT LONG TERM GOAL #8   Title I with updated HEP.    Baseline Pt. requires assist, and cuing for HEP. 08/08/20: reports completing with husband assist for technique    Time 12    Period Weeks    Status Partially Met      OT LONG TERM GOAL  #9   TITLE Pt. will use her right hand to assist the left hand in searching for items in, and holding her purse.    Baseline Pt. is unable to perform. Pt requires MIN cues to continually use RUE to hold purse.    Time 12    Period Weeks    Status Partially Met      OT LONG TERM GOAL  #10   TITLE Pt. will use her right hand  to assist the left to independently place eye glasses into a case.    Baseline Pt. has difficulty performing. 08/08/20: cues to incorporate RUE    Time 12    Period Weeks    Status Partially Met                 Plan - 08/08/20 1624    Clinical Impression Statement Pt continues to initiate R active digit grasp and release with her 2nd, 3rd digit and thumb however requires cues, and assist for positioning of her hand and the item that she is grasping. Continued difficulty initiating active 5th digit felxion/extension for grasping items. Pt continues to work on improving RUE ROM, and functional reaching in preparation for hand to face patterns for self-care tasks.    OT Occupational Profile and History Detailed Assessment- Review of Records and additional review of physical, cognitive, psychosocial history related to current functional performance    Occupational performance deficits (Please refer to evaluation for details): ADL's;IADL's;Rest and Sleep;Work;Play;Leisure    Body Structure / Function / Physical Skills  ADL;Balance;Coordination;Decreased knowledge of precautions;Decreased knowledge of use of DME;Dexterity;Edema;Mobility;Tone;Strength;IADL;Sensation;GMC;Gait;ROM;FMC;Flexibility;Pain;Vision;UE functional use;Endurance    Cognitive Skills Attention;Perception;Problem Solve;Safety Awareness;Thought    Rehab Potential Good    Clinical Decision Making Several treatment options, min-mod task modification necessary    Comorbidities Affecting Occupational Performance: None    Modification or Assistance to Complete Evaluation  Min-Moderate modification of tasks or assist with assess necessary to complete eval    OT Frequency 3x / week    OT Duration 12 weeks    OT Treatment/Interventions Self-care/ADL training;Ultrasound;Energy conservation;Visual/perceptual remediation/compensation;Patient/family education;DME and/or AE instruction;Aquatic Therapy;Paraffin;Gait Training;Passive range of motion;Balance training;Fluidtherapy;Electrical Stimulation;Functional Mobility Training;Splinting;Moist Heat;Therapeutic exercise;Manual Therapy;Cognitive remediation/compensation;Manual lymph drainage;Neuromuscular education;Coping strategies training    Consulted and Agree with Plan of Care Patient           Patient will benefit from skilled therapeutic intervention in order to improve the following deficits and impairments:   Body Structure / Function / Physical Skills: ADL,Balance,Coordination,Decreased knowledge of precautions,Decreased knowledge of use of DME,Dexterity,Edema,Mobility,Tone,Strength,IADL,Sensation,GMC,Gait,ROM,FMC,Flexibility,Pain,Vision,UE functional use,Endurance Cognitive Skills: Attention,Perception,Problem Solve,Safety Awareness,Thought     Visit Diagnosis: Other lack of coordination  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 Dyspareunia in female 04/05/2020   . History of abnormal cervical Pap smear 03/10/2020  . GIB (gastrointestinal bleeding) 02/28/2020  . Elevated BUN   . Prediabetes   . 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 Cerebral vasospasm   . Sinus tachycardia   . Dysphagia, post-stroke   . Thrombocytopenia ([place])   . Acute blood loss anemia   . Brain aneurysm   . ICH (intracerebral hemorrhage) ([place]) 01/05/2020  . History of adenomatous polyp of colon 02/07/2016  . 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 Fissure in ano 11/11/2014  . Hemorrhoids 11/11/2014  . Constipation 11/19/2013  . GERD (gastroesophageal reflux disease) 03/24/2013  . Irritable bowel syndrome with diarrhea 03/24/2013  . Herpes zoster 05/14/2005  . Abnormal Pap smear of cervix 05/15/1983  . History of cervical dysplasia 05/15/1983    Dessie Coma, M.S. OTR/L  08/08/20, 5:04 PM  ascom [phone]  [place] MAIN [facility] SERVICES [address] [place], [place], [address] Phone: [phone]   Fax:  [phone]  Name: Erika Stanton MRN: [id number] Date of Birth: 1955-08-04

## 2020-08-09 ENCOUNTER — Ambulatory Visit: Payer: BC Managed Care – PPO | Admitting: Occupational Therapy

## 2020-08-09 ENCOUNTER — Encounter: Payer: BC Managed Care – PPO | Admitting: Speech Pathology

## 2020-08-09 ENCOUNTER — Encounter: Payer: Self-pay | Admitting: Occupational Therapy

## 2020-08-09 ENCOUNTER — Other Ambulatory Visit: Payer: Self-pay

## 2020-08-09 DIAGNOSIS — R278 Other lack of coordination: Secondary | ICD-10-CM

## 2020-08-09 DIAGNOSIS — M6281 Muscle weakness (generalized): Secondary | ICD-10-CM | POA: Diagnosis not present

## 2020-08-09 DIAGNOSIS — I69159 Hemiplegia and hemiparesis following nontraumatic intracerebral hemorrhage affecting unspecified side: Secondary | ICD-10-CM

## 2020-08-09 DIAGNOSIS — I607 Nontraumatic subarachnoid hemorrhage from unspecified intracranial artery: Secondary | ICD-10-CM

## 2020-08-09 NOTE — Therapy (Signed)
[place] MAIN [facility] SERVICES [address] [place], [place], [address] Phone: [phone]   Fax:  [phone]  Occupational Therapy Treatment  Patient Details  Name: Erika Stanton MRN: [id number] Date of Birth: 1955-12-26 Referring Provider (OT): Dr. Naaman Plummer   Encounter Date: 08/09/2020   OT End of Session - 08/12/20 1801    Visit Number [id number]    Number of Visits 60    Date for OT Re-Evaluation 08/30/20    Authorization Type Progress report period starting 08/08/2020    OT Start Time 0901    OT Stop Time 0945    OT Time Calculation (min) 44 min    Activity Tolerance Patient tolerated treatment well    Behavior During Therapy [facility] for tasks assessed/performed           Past Medical History:  Diagnosis Date  . Aneurysm ([place])   . Melena     Past Surgical History:  Procedure Laterality Date  . CHOLECYSTECTOMY    . COLONOSCOPY  11/2017   at [facility]. no recurrent polyps.  suggest repeat surveillance study 11/2022.    Marland Kitchen COLONOSCOPY W/ POLYPECTOMY  06/2014   Dr Roxy Manns at [facility].  3 adenomatoous polyps, anal fissure.    . ESOPHAGOGASTRODUODENOSCOPY (EGD) WITH PROPOFOL N/A 01/27/2020   Procedure: ESOPHAGOGASTRODUODENOSCOPY (EGD) WITH PROPOFOL;  Surgeon: Mauri Pole, MD;  Location: [facility];  Service: Endoscopy;  Laterality: N/A;  . IR 3D INDEPENDENT WKST  01/06/2020  . IR ANGIO INTRA EXTRACRAN SEL INTERNAL CAROTID BILAT MOD SED  01/06/2020  . IR ANGIO VERTEBRAL SEL VERTEBRAL UNI L MOD SED  01/06/2020  . IR ANGIOGRAM FOLLOW UP STUDY  01/06/2020  . IR ANGIOGRAM FOLLOW UP STUDY  01/06/2020  . IR ANGIOGRAM FOLLOW UP STUDY  01/06/2020  . IR ANGIOGRAM FOLLOW UP STUDY  01/06/2020  . IR ANGIOGRAM FOLLOW UP STUDY  01/06/2020  . IR ANGIOGRAM FOLLOW UP STUDY  01/06/2020  . IR ANGIOGRAM FOLLOW UP STUDY  01/06/2020  . IR ANGIOGRAM FOLLOW UP STUDY  01/06/2020  . IR ANGIOGRAM FOLLOW UP STUDY  01/06/2020  . IR ANGIOGRAM FOLLOW UP STUDY  01/06/2020  . IR NEURO  EACH ADD'L AFTER BASIC UNI RIGHT (MS)  01/06/2020  . IR TRANSCATH/EMBOLIZ  01/06/2020  . RADIOLOGY WITH ANESTHESIA N/A 01/06/2020   Procedure: IR WITH ANESTHESIA FOR ANEURYSM;  Surgeon: Consuella Lose, MD;  Location: [place];  Service: Radiology;  Laterality: N/A;  . TUBAL LIGATION      There were no vitals filed for this visit.   Subjective Assessment - 08/11/20 1800    Subjective  Pt reports she is scheduled to have her lumpectomy on August 30, 2020    Pertinent History ICH 01/06/20, Aneurysm. PMH: OA bilateral knees and hands    Repetition Decreases Symptoms    Patient Stated Goals Pt would like to be as independent as possible.    Pain Onset More than a month ago           Moist heat to right shoulder, forearm and hand prior to treatment session to decrease pain and increase ROM, tissue elasticity.   Therapeutic Exercise: Shoulder shrugs, retraction and shoulder rolls active assist with therapist and followed by AROM for each exercise with cues as neededperformed in sitting.  Pt seen for PROMof RUE, all joints and available planes of motionfrom a seated position, followed by AAROM with forward reaching, ABD, elbow flexion/extension, supination of forearm, wrist flexion/extension, digit extension with prolonged stretch. Reaching  forwards to kneewith right hand, fingers tend to stay in flexion.  Hand to mouth patterns for increased repetitions and multiple trials, patient reports continuing to work on self feeding with snacks at home.  UBE in standing for 5 mins with no resistance with therapist in constant attendance to provide hand over hand for the right side grip.   Focused on gross grasp and releasing patterns this date, increased difficulty with thumb movement with releasing items and requires assist from therapist.   Response to treatment: Patient remains emotionally labile at times regarding her current medical status.  She just had surgery in the last few weeks for  another aneurysm and now she will have to have a lumpectomy in the next couple weeks.  Her husband continues to assist her with home exercises each day and she has a variety of tasks in her home program she can choose from on a daily basis.  She is wearing her splint at times during her naps during the day and as well as night to help manage spasticity.  She continues to demonstrate increased spasticity especially in the ulnar side of her right hand.  Some thumb pain which may be due to to arthritic changes and limited thumb IP flexion.  Continue to work towards goals and plan of care to maximize safety and independence in necessary daily tasks                     OT Education - 08/12/20 1801    Education Details ROM, pain control, strength, finger ROM, functional tasks at home    Person(s) Educated Patient    Methods Explanation;Demonstration;Verbal cues;Handout    Comprehension Verbalized understanding;Returned demonstration;Verbal cues required            OT Short Term Goals - 08/08/20 1626      OT SHORT TERM GOAL #4   Title Pt will demonstrate 60* A/ROM shoulder flexion for RUE with pain less than or equal to 4/10.    Baseline Pt. presents with limited isolated  right shoulder flexion with pain continues to be 2. 08/08/20: 40* AROM with 3/10 pain    Time 4    Period Weeks    Status Revised    Target Date 09/05/20      OT SHORT TERM GOAL #5   Title Pt will demonstrate improved LUE fine motor coordination for ADLs as evidenced by decreasing 9 hole peg test score to 25 secs or less.    Baseline Left FMC 41 sec visa the 9 hole peg test. 08/08/20: 32 seconds via 9 hole peg    Time 6    Period Weeks    Status Revised      OT SHORT TERM GOAL #7   Title Pt will demonstrate at least 25% finger flexion/ extension in RUE in prep for functional use.    Baseline Limited PROM, and active digit flexion, and extension. Improved AROM for right 2nd, and 3rd digits. significant  limitations with the 4th, and 5th digits. 08/08/20: AROM digit MCP 2nd (80*), 3rd (85*), 4th (80*), 5th (90*)    Time 4    Period Weeks    Status On-going    Target Date 09/05/20             OT Long Term Goals - 08/08/20 1650      OT LONG TERM GOAL #1   Title Pt will perform all basic ADLs with supervision    Baseline Pt. requires minA from  her husband. 08/08/20: Pt continues to require MIN A for grooming and dressing tasks.    Time 12    Period Weeks    Status On-going      OT LONG TERM GOAL #2   Title Pt will demonstrate 60* RUE shoulder flexion in prep for functional reach with pain less than or equal to 3/10.    Baseline Pt. conitnues to present with limited AROM for isolated  right shoulder flexion with pain 2. 08/08/20: R AROM Shoulder flexion - 45* c 3/10 pain.    Time 12    Period Weeks    Status Revised      OT LONG TERM GOAL #3   Title Pt will use RUE as a stabilizer/ gross A at least 30 % of the time for ADLs/ IADLs.    Baseline Pt. is using her right hand as a gross stabilizer 15% of the time. 08/08/20: requires MAX cues    Time 12    Period Weeks    Status On-going      OT LONG TERM GOAL #4   Title Pt will increase LUE grip strength to 25 lbs or greater for increased ease with ADLs.    Baseline Pt. has improved with left grip strength. Pt.continues to work towards 25#. 08/08/20: L grip 19#    Time 12    Period Weeks    Status On-going      OT LONG TERM GOAL #5   Title Pt will perform simple home management/ snack prep with min A.    Baseline Pt. conitnues to try to help when she can, however her husband mostly perfroms these tasks. 08/08/20: Husband performs    Time 12    Period Weeks    Status On-going      OT LONG TERM GOAL #6   Title Pt will demonstrate ability to grasp/ release a cup 2/3 trials with RUE.    Baseline Pt. is initialing grasp, and active release of objects, however is unable to grasp/release a cup. 08/08/20: Able to grasp, unable to release x1  trial    Time 12    Period Weeks    Status On-going      OT LONG TERM GOAL #8   Title I with updated HEP.    Baseline Pt. requires assist, and cuing for HEP. 08/08/20: reports completing with husband assist for technique    Time 12    Period Weeks    Status Partially Met      OT LONG TERM GOAL  #9   TITLE Pt. will use her right hand to assist the left hand in searching for items in, and holding her purse.    Baseline Pt. is unable to perform. Pt requires MIN cues to continually use RUE to hold purse.    Time 12    Period Weeks    Status Partially Met      OT LONG TERM GOAL  #10   TITLE Pt. will use her right hand to assist the left to independently place eye glasses into a case.    Baseline Pt. has difficulty performing. 08/08/20: cues to incorporate RUE    Time 12    Period Weeks    Status Partially Met                 Plan - 08/11/20 1801    Clinical Impression Statement Patient remains emotionally labile at times regarding her current medical status.  She just had surgery in the  last few weeks for another aneurysm and now she will have to have a lumpectomy in the next couple weeks.  Her husband continues to assist her with home exercises each day and she has a variety of tasks in her home program she can choose from on a daily basis.  She is wearing her splint at times during her naps during the day and as well as night to help manage spasticity.  She continues to demonstrate increased spasticity especially in the ulnar side of her right hand.  Some thumb pain which may be due to to arthritic changes and limited thumb IP flexion.  Continue to work towards goals and plan of care to maximize safety and independence in necessary daily tasks    OT Occupational Profile and History Detailed Assessment- Review of Records and additional review of physical, cognitive, psychosocial history related to current functional performance    Occupational performance deficits (Please refer to  evaluation for details): ADL's;IADL's;Rest and Sleep;Work;Play;Leisure    Body Structure / Function / Physical Skills ADL;Balance;Coordination;Decreased knowledge of precautions;Decreased knowledge of use of DME;Dexterity;Edema;Mobility;Tone;Strength;IADL;Sensation;GMC;Gait;ROM;FMC;Flexibility;Pain;Vision;UE functional use;Endurance    Cognitive Skills Attention;Perception;Problem Solve;Safety Awareness;Thought    Rehab Potential Good    Clinical Decision Making Several treatment options, min-mod task modification necessary    Comorbidities Affecting Occupational Performance: None    Modification or Assistance to Complete Evaluation  Min-Moderate modification of tasks or assist with assess necessary to complete eval    OT Frequency 3x / week    OT Duration 12 weeks    OT Treatment/Interventions Self-care/ADL training;Ultrasound;Energy conservation;Visual/perceptual remediation/compensation;Patient/family education;DME and/or AE instruction;Aquatic Therapy;Paraffin;Gait Training;Passive range of motion;Balance training;Fluidtherapy;Electrical Stimulation;Functional Mobility Training;Splinting;Moist Heat;Therapeutic exercise;Manual Therapy;Cognitive remediation/compensation;Manual lymph drainage;Neuromuscular education;Coping strategies training    Consulted and Agree with Plan of Care Patient           Patient will benefit from skilled therapeutic intervention in order to improve the following deficits and impairments:   Body Structure / Function / Physical Skills: ADL,Balance,Coordination,Decreased knowledge of precautions,Decreased knowledge of use of DME,Dexterity,Edema,Mobility,Tone,Strength,IADL,Sensation,GMC,Gait,ROM,FMC,Flexibility,Pain,Vision,UE functional use,Endurance Cognitive Skills: Attention,Perception,Problem Solve,Safety Awareness,Thought     Visit Diagnosis: Muscle weakness (generalized)  Other lack of coordination  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 Dyspareunia in female 04/05/2020  . History of abnormal cervical Pap smear 03/10/2020  . GIB (gastrointestinal bleeding) 02/28/2020  . Elevated BUN   . Prediabetes   . 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 Cerebral vasospasm   . Sinus tachycardia   . Dysphagia, post-stroke   . Thrombocytopenia ([place])   . Acute blood loss anemia   . Brain aneurysm   . ICH (intracerebral hemorrhage) ([place]) 01/05/2020  . History of adenomatous polyp of colon 02/07/2016  . 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 Fissure in ano 11/11/2014  . Hemorrhoids 11/11/2014  . Constipation 11/19/2013  . GERD (gastroesophageal reflux disease) 03/24/2013  . Irritable bowel syndrome with diarrhea 03/24/2013  . Herpes zoster 05/14/2005  . Abnormal Pap smear of cervix 05/15/1983  . History of cervical dysplasia 05/15/1983   Achilles Dunk, OTR/L, CLT  Erika Stanton 08/12/2020, 6:08 PM  [place] MAIN [facility] SERVICES [address] [place], [place], [address] Phone: [phone]   Fax:  [phone]  Name: Erika Stanton MRN: [id number] Date of Birth: 1955-10-27

## 2020-08-11 ENCOUNTER — Other Ambulatory Visit: Payer: Self-pay

## 2020-08-11 ENCOUNTER — Ambulatory Visit: Payer: BC Managed Care – PPO | Admitting: Occupational Therapy

## 2020-08-11 ENCOUNTER — Encounter: Payer: BC Managed Care – PPO | Admitting: Speech Pathology

## 2020-08-11 DIAGNOSIS — M6281 Muscle weakness (generalized): Secondary | ICD-10-CM

## 2020-08-11 DIAGNOSIS — I607 Nontraumatic subarachnoid hemorrhage from unspecified intracranial artery: Secondary | ICD-10-CM

## 2020-08-11 DIAGNOSIS — I69159 Hemiplegia and hemiparesis following nontraumatic intracerebral hemorrhage affecting unspecified side: Secondary | ICD-10-CM

## 2020-08-11 DIAGNOSIS — R278 Other lack of coordination: Secondary | ICD-10-CM

## 2020-08-12 ENCOUNTER — Encounter: Payer: Self-pay | Admitting: Occupational Therapy

## 2020-08-12 NOTE — Therapy (Signed)
[place] MAIN [facility] SERVICES [address] [place], [place], [address] Phone: [phone]   Fax:  [phone]  Occupational Therapy Treatment  Patient Details  Name: Erika Stanton MRN: [id number] Date of Birth: 1956/03/12 Referring Provider (OT): Dr. Naaman Plummer   Encounter Date: 08/11/2020   OT End of Session - 08/12/20 1835    Visit Number 52    Number of Visits 60    Date for OT Re-Evaluation 08/30/20    Authorization Type Progress report period starting 08/08/2020    OT Start Time 1300    OT Stop Time 1347    OT Time Calculation (min) 47 min    Activity Tolerance Patient tolerated treatment well    Behavior During Therapy [facility] for tasks assessed/performed           Past Medical History:  Diagnosis Date  . Aneurysm ([place])   . Melena     Past Surgical History:  Procedure Laterality Date  . CHOLECYSTECTOMY    . COLONOSCOPY  11/2017   at [facility]. no recurrent polyps.  suggest repeat surveillance study 11/2022.    Marland Kitchen COLONOSCOPY W/ POLYPECTOMY  06/2014   Dr Roxy Manns at [facility].  3 adenomatoous polyps, anal fissure.    . ESOPHAGOGASTRODUODENOSCOPY (EGD) WITH PROPOFOL N/A 01/27/2020   Procedure: ESOPHAGOGASTRODUODENOSCOPY (EGD) WITH PROPOFOL;  Surgeon: Mauri Pole, MD;  Location: [facility];  Service: Endoscopy;  Laterality: N/A;  . IR 3D INDEPENDENT WKST  01/06/2020  . IR ANGIO INTRA EXTRACRAN SEL INTERNAL CAROTID BILAT MOD SED  01/06/2020  . IR ANGIO VERTEBRAL SEL VERTEBRAL UNI L MOD SED  01/06/2020  . IR ANGIOGRAM FOLLOW UP STUDY  01/06/2020  . IR ANGIOGRAM FOLLOW UP STUDY  01/06/2020  . IR ANGIOGRAM FOLLOW UP STUDY  01/06/2020  . IR ANGIOGRAM FOLLOW UP STUDY  01/06/2020  . IR ANGIOGRAM FOLLOW UP STUDY  01/06/2020  . IR ANGIOGRAM FOLLOW UP STUDY  01/06/2020  . IR ANGIOGRAM FOLLOW UP STUDY  01/06/2020  . IR ANGIOGRAM FOLLOW UP STUDY  01/06/2020  . IR ANGIOGRAM FOLLOW UP STUDY  01/06/2020  . IR ANGIOGRAM FOLLOW UP STUDY  01/06/2020  . IR NEURO  EACH ADD'L AFTER BASIC UNI RIGHT (MS)  01/06/2020  . IR TRANSCATH/EMBOLIZ  01/06/2020  . RADIOLOGY WITH ANESTHESIA N/A 01/06/2020   Procedure: IR WITH ANESTHESIA FOR ANEURYSM;  Surgeon: Consuella Lose, MD;  Location: [place];  Service: Radiology;  Laterality: N/A;  . TUBAL LIGATION      There were no vitals filed for this visit.   Subjective Assessment - 08/12/20 1834    Subjective  Pt reports she is still nervous about her upcoming surgery mid april.  discussed needing a new order to continue with OT after surgery.    Pertinent History ICH 01/06/20, Aneurysm. PMH: OA bilateral knees and hands    Patient Stated Goals Pt would like to be as independent as possible.    Currently in Pain? Yes    Pain Score 2     Pain Location Shoulder    Pain Orientation Right    Pain Descriptors / Indicators Aching    Pain Type Chronic pain           Moist heat to right shoulder, forearm and hand prior to treatment session to decrease pain and increase ROM, tissue elasticity.   Therapeutic Exercise: Shoulder shrugs, retraction and shoulder rolls active assist with therapist and followed by AROM for each exercise with cues as neededperformed in sitting.  Pt seen for PROMof RUE, all joints and available planes of motionfrom a seated position, followed by AAROM with forward reaching, ABD, elbow flexion/extension, supination of forearm, wrist flexion/extension, digit extension with prolonged stretch. Reaching forwards to kneewith right hand, fingers tend to stay in flexion, therapist assisting patient to maintain finger extension with reaching forwards.UBE in standing for 5 mins with no resistance with therapist in constant attendance to provide hand over hand for the right side grip.  Tabletop stretches with right UE from seated position, therapist providing hand over hand guiding then patient performing with cues to move in a forwards reaching direction and side to side.   Grasp of jumbo pegs,  difficulty picking up from table, requires assist for therapist to hand patient the items, placing between thumb and index finger.  Patient working towards dropping into a bucket placed on the floor between her legs.    Response to tx: Patient continues to perform HEP with assist from husband, she admits it is difficult at times to process information and initiate exercises on her own mostly due to all the medical issues and upcoming surgeries she is facing and has a hard time refocusing her attention to her UE.  Patient responds well to cues and guiding from therapist for ROM and facilitation of grasp and release patterns.  Continues to demonstrate increased spasticity in her RUE and is planning to get Botox injections next month after lumpectomy surgery.  Continue to work towards goals in plan of care to improve functional use of RUE for daily tasks.                      OT Education - 08/12/20 1835    Education Details ROM, pain control, strength, finger ROM, functional tasks at home    Person(s) Educated Patient    Methods Explanation;Demonstration;Verbal cues;Handout    Comprehension Verbalized understanding;Returned demonstration;Verbal cues required            OT Short Term Goals - 08/08/20 1626      OT SHORT TERM GOAL #4   Title Pt will demonstrate 60* A/ROM shoulder flexion for RUE with pain less than or equal to 4/10.    Baseline Pt. presents with limited isolated  right shoulder flexion with pain continues to be 2. 08/08/20: 40* AROM with 3/10 pain    Time 4    Period Weeks    Status Revised    Target Date 09/05/20      OT SHORT TERM GOAL #5   Title Pt will demonstrate improved LUE fine motor coordination for ADLs as evidenced by decreasing 9 hole peg test score to 25 secs or less.    Baseline Left FMC 41 sec visa the 9 hole peg test. 08/08/20: 32 seconds via 9 hole peg    Time 6    Period Weeks    Status Revised      OT SHORT TERM GOAL #7   Title Pt will  demonstrate at least 25% finger flexion/ extension in RUE in prep for functional use.    Baseline Limited PROM, and active digit flexion, and extension. Improved AROM for right 2nd, and 3rd digits. significant limitations with the 4th, and 5th digits. 08/08/20: AROM digit MCP 2nd (80*), 3rd (85*), 4th (80*), 5th (90*)    Time 4    Period Weeks    Status On-going    Target Date 09/05/20             OT Long  Term Goals - 08/08/20 1650      OT LONG TERM GOAL #1   Title Pt will perform all basic ADLs with supervision    Baseline Pt. requires minA from her husband. 08/08/20: Pt continues to require MIN A for grooming and dressing tasks.    Time 12    Period Weeks    Status On-going      OT LONG TERM GOAL #2   Title Pt will demonstrate 60* RUE shoulder flexion in prep for functional reach with pain less than or equal to 3/10.    Baseline Pt. conitnues to present with limited AROM for isolated  right shoulder flexion with pain 2. 08/08/20: R AROM Shoulder flexion - 45* c 3/10 pain.    Time 12    Period Weeks    Status Revised      OT LONG TERM GOAL #3   Title Pt will use RUE as a stabilizer/ gross A at least 30 % of the time for ADLs/ IADLs.    Baseline Pt. is using her right hand as a gross stabilizer 15% of the time. 08/08/20: requires MAX cues    Time 12    Period Weeks    Status On-going      OT LONG TERM GOAL #4   Title Pt will increase LUE grip strength to 25 lbs or greater for increased ease with ADLs.    Baseline Pt. has improved with left grip strength. Pt.continues to work towards 25#. 08/08/20: L grip 19#    Time 12    Period Weeks    Status On-going      OT LONG TERM GOAL #5   Title Pt will perform simple home management/ snack prep with min A.    Baseline Pt. conitnues to try to help when she can, however her husband mostly perfroms these tasks. 08/08/20: Husband performs    Time 12    Period Weeks    Status On-going      OT LONG TERM GOAL #6   Title Pt will  demonstrate ability to grasp/ release a cup 2/3 trials with RUE.    Baseline Pt. is initialing grasp, and active release of objects, however is unable to grasp/release a cup. 08/08/20: Able to grasp, unable to release x1 trial    Time 12    Period Weeks    Status On-going      OT LONG TERM GOAL #8   Title I with updated HEP.    Baseline Pt. requires assist, and cuing for HEP. 08/08/20: reports completing with husband assist for technique    Time 12    Period Weeks    Status Partially Met      OT LONG TERM GOAL  #9   TITLE Pt. will use her right hand to assist the left hand in searching for items in, and holding her purse.    Baseline Pt. is unable to perform. Pt requires MIN cues to continually use RUE to hold purse.    Time 12    Period Weeks    Status Partially Met      OT LONG TERM GOAL  #10   TITLE Pt. will use her right hand to assist the left to independently place eye glasses into a case.    Baseline Pt. has difficulty performing. 08/08/20: cues to incorporate RUE    Time 12    Period Weeks    Status Partially Met  Plan - 08/12/20 1836    Clinical Impression Statement Patient continues to perform HEP with assist from husband, she admits it is difficult at times to process information and initiate exercises on her own mostly due to all the medical issues and upcoming surgeries she is facing and has a hard time refocusing her attention to her UE.  Patient responds well to cues and guiding from therapist for ROM and facilitation of grasp and release patterns.  Continues to demonstrate increased spasticity in her RUE and is planning to get Botox injections next month after lumpectomy surgery.  Continue to work towards goals in plan of care to improve functional use of RUE for daily tasks.    OT Occupational Profile and History Detailed Assessment- Review of Records and additional review of physical, cognitive, psychosocial history related to current functional  performance    Occupational performance deficits (Please refer to evaluation for details): ADL's;IADL's;Rest and Sleep;Work;Play;Leisure    Body Structure / Function / Physical Skills ADL;Balance;Coordination;Decreased knowledge of precautions;Decreased knowledge of use of DME;Dexterity;Edema;Mobility;Tone;Strength;IADL;Sensation;GMC;Gait;ROM;FMC;Flexibility;Pain;Vision;UE functional use;Endurance    Cognitive Skills Attention;Perception;Problem Solve;Safety Awareness;Thought    Rehab Potential Good    Clinical Decision Making Several treatment options, min-mod task modification necessary    Comorbidities Affecting Occupational Performance: None    Modification or Assistance to Complete Evaluation  Min-Moderate modification of tasks or assist with assess necessary to complete eval    OT Frequency 3x / week    OT Duration 12 weeks    OT Treatment/Interventions Self-care/ADL training;Ultrasound;Energy conservation;Visual/perceptual remediation/compensation;Patient/family education;DME and/or AE instruction;Aquatic Therapy;Paraffin;Gait Training;Passive range of motion;Balance training;Fluidtherapy;Electrical Stimulation;Functional Mobility Training;Splinting;Moist Heat;Therapeutic exercise;Manual Therapy;Cognitive remediation/compensation;Manual lymph drainage;Neuromuscular education;Coping strategies training    Consulted and Agree with Plan of Care Patient           Patient will benefit from skilled therapeutic intervention in order to improve the following deficits and impairments:   Body Structure / Function / Physical Skills: ADL,Balance,Coordination,Decreased knowledge of precautions,Decreased knowledge of use of DME,Dexterity,Edema,Mobility,Tone,Strength,IADL,Sensation,GMC,Gait,ROM,FMC,Flexibility,Pain,Vision,UE functional use,Endurance Cognitive Skills: Attention,Perception,Problem Solve,Safety Awareness,Thought     Visit Diagnosis: Muscle weakness (generalized)  Other lack of  coordination  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 Dyspareunia in female 04/05/2020  . History of abnormal cervical Pap smear 03/10/2020  . GIB (gastrointestinal bleeding) 02/28/2020  . Elevated BUN   . Prediabetes   . 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 Cerebral vasospasm   . Sinus tachycardia   . Dysphagia, post-stroke   . Thrombocytopenia ([place])   . Acute blood loss anemia   . Brain aneurysm   . ICH (intracerebral hemorrhage) ([place]) 01/05/2020  . History of adenomatous polyp of colon 02/07/2016  . 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 Fissure in ano 11/11/2014  . Hemorrhoids 11/11/2014  . Constipation 11/19/2013  . GERD (gastroesophageal reflux disease) 03/24/2013  . Irritable bowel syndrome with diarrhea 03/24/2013  . Herpes zoster 05/14/2005  . Abnormal Pap smear of cervix 05/15/1983  . History of cervical dysplasia 05/15/1983   Achilles Dunk, OTR/L, CLT  Elisa Sorlie 08/12/2020, 6:46 PM  [place] MAIN [facility] SERVICES [address] [place], [place], [address] Phone: [phone]   Fax:  [phone]  Name: Erika Stanton MRN: [id number] Date of Birth: Feb 19, 1956

## 2020-08-15 ENCOUNTER — Other Ambulatory Visit: Payer: Self-pay

## 2020-08-15 ENCOUNTER — Encounter: Payer: Self-pay | Admitting: Occupational Therapy

## 2020-08-15 ENCOUNTER — Ambulatory Visit: Payer: BC Managed Care – PPO | Attending: Physical Medicine & Rehabilitation | Admitting: Occupational Therapy

## 2020-08-15 DIAGNOSIS — I69159 Hemiplegia and hemiparesis following nontraumatic intracerebral hemorrhage affecting unspecified side: Secondary | ICD-10-CM | POA: Diagnosis present

## 2020-08-15 DIAGNOSIS — R278 Other lack of coordination: Secondary | ICD-10-CM | POA: Diagnosis present

## 2020-08-15 DIAGNOSIS — M6281 Muscle weakness (generalized): Secondary | ICD-10-CM | POA: Insufficient documentation

## 2020-08-15 NOTE — Therapy (Addendum)
[place] MAIN [facility] SERVICES [address] [place], [place], [address] Phone: [phone]   Fax:  [phone]  Occupational Therapy Treatment  Patient Details  Name: Erika Stanton MRN: [id number] Date of Birth: [birth year] Referring Provider (OT): Dr. Naaman Plummer   Encounter Date: 08/15/2020   OT End of Session - 08/15/20 1307    Visit Number [id number]    Number of Visits 60    Date for OT Re-Evaluation 08/30/20    Authorization Type Progress report period starting 08/08/2020    OT Start Time 1300    OT Stop Time 1345    OT Time Calculation (min) 45 min    Activity Tolerance Patient tolerated treatment well    Behavior During Therapy [facility] for tasks assessed/performed           Past Medical History:  Diagnosis Date  . Aneurysm ([place])   . Melena     Past Surgical History:  Procedure Laterality Date  . CHOLECYSTECTOMY    . COLONOSCOPY  11/2017   at [facility]. no recurrent polyps.  suggest repeat surveillance study 11/2022.    Marland Kitchen COLONOSCOPY W/ POLYPECTOMY  06/2014   Dr Roxy Manns at [facility].  3 adenomatoous polyps, anal fissure.    . ESOPHAGOGASTRODUODENOSCOPY (EGD) WITH PROPOFOL N/A 01/27/2020   Procedure: ESOPHAGOGASTRODUODENOSCOPY (EGD) WITH PROPOFOL;  Surgeon: Mauri Pole, MD;  Location: [place] [facility];  Service: Endoscopy;  Laterality: N/A;  . IR 3D INDEPENDENT WKST  01/06/2020  . IR ANGIO INTRA EXTRACRAN SEL INTERNAL CAROTID BILAT MOD SED  01/06/2020  . IR ANGIO VERTEBRAL SEL VERTEBRAL UNI L MOD SED  01/06/2020  . IR ANGIOGRAM FOLLOW UP STUDY  01/06/2020  . IR ANGIOGRAM FOLLOW UP STUDY  01/06/2020  . IR ANGIOGRAM FOLLOW UP STUDY  01/06/2020  . IR ANGIOGRAM FOLLOW UP STUDY  01/06/2020  . IR ANGIOGRAM FOLLOW UP STUDY  01/06/2020  . IR ANGIOGRAM FOLLOW UP STUDY  01/06/2020  . IR ANGIOGRAM FOLLOW UP STUDY  01/06/2020  . IR ANGIOGRAM FOLLOW UP STUDY  01/06/2020  . IR ANGIOGRAM FOLLOW UP STUDY  01/06/2020  . IR ANGIOGRAM FOLLOW UP STUDY  01/06/2020  . IR NEURO  EACH ADD'L AFTER BASIC UNI RIGHT (MS)  01/06/2020  . IR TRANSCATH/EMBOLIZ  01/06/2020  . RADIOLOGY WITH ANESTHESIA N/A 01/06/2020   Procedure: IR WITH ANESTHESIA FOR ANEURYSM;  Surgeon: Consuella Lose, MD;  Location: [place];  Service: Radiology;  Laterality: N/A;  . TUBAL LIGATION      There were no vitals filed for this visit.   Subjective Assessment - 08/15/20 1339    Subjective  Pt. will have Breast surgery 08/30/2020    Pertinent History ICH 01/06/20, Aneurysm. PMH: OA bilateral knees and hands    Currently in Pain? No/denies          OT TREATMENT   Therapeutic Exercise:  Pt. was able to tolerate AAROM/PROM for shoulder flexion, abduction, elbow flexion, elbow extension, AAROM supinationPROM for right wrist, digit MP, PIP, and DIP flexion, and extension, AROM digit MP, PIP, and DIP flexion, extension.Thumb radial, and palmar abduction, Thumb IP flexion, and extensionin preparation for grasping objects.Education was provided about continuing to seek out opportunities toengage her hand more at home.  Neuromuscular re-ed:  Pt. worked ongrasping 1" circular square blocks from a platform placed at her lap, and setting them on a table at her right side.  Pt. Presented with increased active shoulder abduction when placing them to the right.  Pt. continues  to engage her right UE during tasksat home, and continues to do homeexercises with her husband using a bar, as well as grasp/releasing tennis balls. Pt. presented with increased thumb ROM with no reports of pain or discomfort with ROM. Pt. Is initiating active digit grasp, and release more with her 2nd, and 3rd digits, and thumb however requires cues, and assist for positioning of her hand, and the item that she is grasping. Pt.continues to work on improving RUE ROM, and functional reaching in preparation for hand to face patterns for self-care tasks.Pt. Continues to work on improving RUE ROM, and functional hand use during  ADL tasks.                        OT Education - 08/15/20 1307    Education Details ROM, pain control, strength, finger ROM, functional tasks at home    Person(s) Educated Patient    Methods Explanation;Demonstration;Verbal cues;Handout    Comprehension Verbalized understanding;Returned demonstration;Verbal cues required            OT Short Term Goals - 08/08/20 1626      OT SHORT TERM GOAL #4   Title Pt will demonstrate 60* A/ROM shoulder flexion for RUE with pain less than or equal to 4/10.    Baseline Pt. presents with limited isolated  right shoulder flexion with pain continues to be 2. 08/08/20: 40* AROM with 3/10 pain    Time 4    Period Weeks    Status Revised    Target Date 09/05/20      OT SHORT TERM GOAL #5   Title Pt will demonstrate improved LUE fine motor coordination for ADLs as evidenced by decreasing 9 hole peg test score to 25 secs or less.    Baseline Left FMC 41 sec visa the 9 hole peg test. 08/08/20: 32 seconds via 9 hole peg    Time 6    Period Weeks    Status Revised      OT SHORT TERM GOAL #7   Title Pt will demonstrate at least 25% finger flexion/ extension in RUE in prep for functional use.    Baseline Limited PROM, and active digit flexion, and extension. Improved AROM for right 2nd, and 3rd digits. significant limitations with the 4th, and 5th digits. 08/08/20: AROM digit MCP 2nd (80*), 3rd (85*), 4th (80*), 5th (90*)    Time 4    Period Weeks    Status On-going    Target Date 09/05/20             OT Long Term Goals - 08/08/20 1650      OT LONG TERM GOAL #1   Title Pt will perform all basic ADLs with supervision    Baseline Pt. requires minA from her husband. 08/08/20: Pt continues to require MIN A for grooming and dressing tasks.    Time 12    Period Weeks    Status On-going      OT LONG TERM GOAL #2   Title Pt will demonstrate 60* RUE shoulder flexion in prep for functional reach with pain less than or equal to 3/10.     Baseline Pt. conitnues to present with limited AROM for isolated  right shoulder flexion with pain 2. 08/08/20: R AROM Shoulder flexion - 45* c 3/10 pain.    Time 12    Period Weeks    Status Revised      OT LONG TERM GOAL #3   Title Pt  will use RUE as a stabilizer/ gross A at least 30 % of the time for ADLs/ IADLs.    Baseline Pt. is using her right hand as a gross stabilizer 15% of the time. 08/08/20: requires MAX cues    Time 12    Period Weeks    Status On-going      OT LONG TERM GOAL #4   Title Pt will increase LUE grip strength to 25 lbs or greater for increased ease with ADLs.    Baseline Pt. has improved with left grip strength. Pt.continues to work towards 25#. 08/08/20: L grip 19#    Time 12    Period Weeks    Status On-going      OT LONG TERM GOAL #5   Title Pt will perform simple home management/ snack prep with min A.    Baseline Pt. conitnues to try to help when she can, however her husband mostly perfroms these tasks. 08/08/20: Husband performs    Time 12    Period Weeks    Status On-going      OT LONG TERM GOAL #6   Title Pt will demonstrate ability to grasp/ release a cup 2/3 trials with RUE.    Baseline Pt. is initialing grasp, and active release of objects, however is unable to grasp/release a cup. 08/08/20: Able to grasp, unable to release x1 trial    Time 12    Period Weeks    Status On-going      OT LONG TERM GOAL #8   Title I with updated HEP.    Baseline Pt. requires assist, and cuing for HEP. 08/08/20: reports completing with husband assist for technique    Time 12    Period Weeks    Status Partially Met      OT LONG TERM GOAL  #9   TITLE Pt. will use her right hand to assist the left hand in searching for items in, and holding her purse.    Baseline Pt. is unable to perform. Pt requires MIN cues to continually use RUE to hold purse.    Time 12    Period Weeks    Status Partially Met      OT LONG TERM GOAL  #10   TITLE Pt. will use her right hand  to assist the left to independently place eye glasses into a case.    Baseline Pt. has difficulty performing. 08/08/20: cues to incorporate RUE    Time 12    Period Weeks    Status Partially Met                 Plan - 08/15/20 1307    Clinical Impression Statement Pt. continues  to engage her right UE during tasksat home, and continues to do homeexercises with her husband using a bar, as well as grasp/releasing tennis balls. Pt. presented with increased thumb ROM with no reports of pain or discomfort with ROM. Pt. Is initiating active digit grasp, and release more with her 2nd, and 3rd digits, and thumb however requires cues, and assist for positioning of her hand, and the item that she is grasping. Pt.continues to work on improving RUE ROM, and functional reaching in preparation for hand to face patterns for self-care tasks.Pt. Continues to work on improving RUE ROM, and functional hand use during ADL tasks.   OT Occupational Profile and History Detailed Assessment- Review of Records and additional review of physical, cognitive, psychosocial history related to current functional performance  Occupational performance deficits (Please refer to evaluation for details): ADL's;IADL's;Rest and Sleep;Work;Play;Leisure    Body Structure / Function / Physical Skills ADL;Balance;Coordination;Decreased knowledge of precautions;Decreased knowledge of use of DME;Dexterity;Edema;Mobility;Tone;Strength;IADL;Sensation;GMC;Gait;ROM;FMC;Flexibility;Pain;Vision;UE functional use;Endurance    Cognitive Skills Attention;Perception;Problem Solve;Safety Awareness;Thought    Rehab Potential Good    Clinical Decision Making Several treatment options, min-mod task modification necessary    Comorbidities Affecting Occupational Performance: None    Modification or Assistance to Complete Evaluation  Min-Moderate modification of tasks or assist with assess necessary to complete eval    OT Frequency 3x / week     OT Duration 12 weeks    OT Treatment/Interventions Self-care/ADL training;Ultrasound;Energy conservation;Visual/perceptual remediation/compensation;Patient/family education;DME and/or AE instruction;Aquatic Therapy;Paraffin;Gait Training;Passive range of motion;Balance training;Fluidtherapy;Electrical Stimulation;Functional Mobility Training;Splinting;Moist Heat;Therapeutic exercise;Manual Therapy;Cognitive remediation/compensation;Manual lymph drainage;Neuromuscular education;Coping strategies training    Consulted and Agree with Plan of Care Patient           Patient will benefit from skilled therapeutic intervention in order to improve the following deficits and impairments:   Body Structure / Function / Physical Skills: ADL,Balance,Coordination,Decreased knowledge of precautions,Decreased knowledge of use of DME,Dexterity,Edema,Mobility,Tone,Strength,IADL,Sensation,GMC,Gait,ROM,FMC,Flexibility,Pain,Vision,UE functional use,Endurance Cognitive Skills: Attention,Perception,Problem Solve,Safety Awareness,Thought     Visit Diagnosis: Muscle weakness (generalized)  Other lack of coordination    Problem List Patient Active Problem List   Diagnosis Date Noted  . Dyspareunia in female 04/05/2020  . History of abnormal cervical Pap smear 03/10/2020  . GIB (gastrointestinal bleeding) 02/28/2020  . Elevated BUN   . Prediabetes   . 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 Cerebral vasospasm   . Sinus tachycardia   . Dysphagia, post-stroke   . Thrombocytopenia ([place])   . Acute blood loss anemia   . Brain aneurysm   . ICH (intracerebral hemorrhage) ([place]) 01/05/2020  . History of adenomatous polyp of colon 02/07/2016  . 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 Fissure in ano 11/11/2014  . Hemorrhoids 11/11/2014  . Constipation 11/19/2013  . GERD (gastroesophageal reflux disease) 03/24/2013  . Irritable bowel syndrome with diarrhea 03/24/2013  . Herpes zoster 05/14/2005   . Abnormal Pap smear of cervix 05/15/1983  . History of cervical dysplasia 05/15/1983    Harrel Carina, MS, OTR/L 08/15/2020, 1:41 PM  [place] MAIN [facility] SERVICES [address] [place], [place], [address] Phone: [phone]   Fax:  [phone]  Name: Erika Stanton MRN: [id number] Date of Birth: 12/03/1955

## 2020-08-16 ENCOUNTER — Encounter: Payer: BC Managed Care – PPO | Admitting: Occupational Therapy

## 2020-08-17 ENCOUNTER — Ambulatory Visit: Payer: BC Managed Care – PPO | Admitting: Occupational Therapy

## 2020-08-17 ENCOUNTER — Encounter: Payer: Self-pay | Admitting: Occupational Therapy

## 2020-08-17 ENCOUNTER — Other Ambulatory Visit: Payer: Self-pay

## 2020-08-17 DIAGNOSIS — R278 Other lack of coordination: Secondary | ICD-10-CM

## 2020-08-17 DIAGNOSIS — M6281 Muscle weakness (generalized): Secondary | ICD-10-CM | POA: Diagnosis not present

## 2020-08-17 NOTE — Therapy (Signed)
[place] MAIN [facility] SERVICES [address] [place], [place], [address] Phone: [phone]   Fax:  [phone]  Occupational Therapy Treatment  Patient Details  Name: Erika Stanton MRN: [id number] Date of Birth: 06-16-1955 Referring Provider (OT): Dr. Naaman Plummer   Encounter Date: 08/17/2020   OT End of Session - 08/17/20 1356    Visit Number [id number]    Number of Visits 60    Date for OT Re-Evaluation 08/30/20    Authorization Type Progress report period starting 08/08/2020    OT Start Time 1300    OT Stop Time 1345    OT Time Calculation (min) 45 min    Activity Tolerance Patient tolerated treatment well    Behavior During Therapy [facility] for tasks assessed/performed           Past Medical History:  Diagnosis Date  . Aneurysm ([place])   . Melena     Past Surgical History:  Procedure Laterality Date  . CHOLECYSTECTOMY    . COLONOSCOPY  11/2017   at [facility]. no recurrent polyps.  suggest repeat surveillance study 11/2022.    Marland Kitchen COLONOSCOPY W/ POLYPECTOMY  06/2014   Dr Roxy Manns at [facility].  3 adenomatoous polyps, anal fissure.    . ESOPHAGOGASTRODUODENOSCOPY (EGD) WITH PROPOFOL N/A 01/27/2020   Procedure: ESOPHAGOGASTRODUODENOSCOPY (EGD) WITH PROPOFOL;  Surgeon: Mauri Pole, MD;  Location: [place] [facility];  Service: Endoscopy;  Laterality: N/A;  . IR 3D INDEPENDENT WKST  01/06/2020  . IR ANGIO INTRA EXTRACRAN SEL INTERNAL CAROTID BILAT MOD SED  01/06/2020  . IR ANGIO VERTEBRAL SEL VERTEBRAL UNI L MOD SED  01/06/2020  . IR ANGIOGRAM FOLLOW UP STUDY  01/06/2020  . IR ANGIOGRAM FOLLOW UP STUDY  01/06/2020  . IR ANGIOGRAM FOLLOW UP STUDY  01/06/2020  . IR ANGIOGRAM FOLLOW UP STUDY  01/06/2020  . IR ANGIOGRAM FOLLOW UP STUDY  01/06/2020  . IR ANGIOGRAM FOLLOW UP STUDY  01/06/2020  . IR ANGIOGRAM FOLLOW UP STUDY  01/06/2020  . IR ANGIOGRAM FOLLOW UP STUDY  01/06/2020  . IR ANGIOGRAM FOLLOW UP STUDY  01/06/2020  . IR ANGIOGRAM FOLLOW UP STUDY  01/06/2020  . IR NEURO  EACH ADD'L AFTER BASIC UNI RIGHT (MS)  01/06/2020  . IR TRANSCATH/EMBOLIZ  01/06/2020  . RADIOLOGY WITH ANESTHESIA N/A 01/06/2020   Procedure: IR WITH ANESTHESIA FOR ANEURYSM;  Surgeon: Consuella Lose, MD;  Location: [place];  Service: Radiology;  Laterality: N/A;  . TUBAL LIGATION      There were no vitals filed for this visit.   Subjective Assessment - 08/17/20 1355    Subjective  Pt. will have Breast surgery 08/30/2020    Pertinent History ICH 01/06/20, Aneurysm. PMH: OA bilateral knees and hands    Repetition Decreases Symptoms    Currently in Pain? Yes          OT TREATMENT   Therapeutic Exercise:  Pt. was able to tolerate AAROM/PROMfor shoulder flexion, abduction,elbow flexion, elbow extension,AAROM supinationPROM for right wrist, digit MP, PIP, and DIP flexion, and extension, AROM digit MP, PIP, and DIP flexion,extension.Thumb radial, and palmar abduction, Thumb IP flexion,and extensionin preparation for grasping objects.Education was provided aboutcontinuing to seek outopportunities toengage her hand more at home.  Neuromuscular re-ed:  Pt. worked [place].5" pegs from a pegboard placed at her lap, and actively releasing them in a container placed to her right side. Pt. presented with increased active shoulder abduction when placing them to the right. Pt. then worked in the reverse direction.  Pt. Is anticipating having her breast surgery on 08/30/2020 Pt. Requires moist heat prior to, and in conjunction with ROM. Pt. continues to engage her right UE during tasksat home, andcontinues to do homeexercises with her husband using a bar, as well as grasp/releasing tennis balls. Pt. presented with increased thumb ROM with no reports of pain or discomfort with ROM.Pt. continues to present with  Improving active digit grasp, and release with her right thumb, 2nd, and 3rd digits. Pt.continues to work on improving RUE ROM, and functional reaching in preparation  for hand to face patterns for self-care tasks.Pt. Continues to work on improving RUE ROM, and functional hand use during ADL tasks.                       OT Education - 08/17/20 1356    Education Details ROM, pain control, strength, finger ROM, functional tasks at home    Person(s) Educated Patient    Methods Explanation;Demonstration;Verbal cues;Handout    Comprehension Verbalized understanding;Returned demonstration;Verbal cues required            OT Short Term Goals - 08/08/20 1626      OT SHORT TERM GOAL #4   Title Pt will demonstrate 60* A/ROM shoulder flexion for RUE with pain less than or equal to 4/10.    Baseline Pt. presents with limited isolated  right shoulder flexion with pain continues to be 2. 08/08/20: 40* AROM with 3/10 pain    Time 4    Period Weeks    Status Revised    Target Date 09/05/20      OT SHORT TERM GOAL #5   Title Pt will demonstrate improved LUE fine motor coordination for ADLs as evidenced by decreasing 9 hole peg test score to 25 secs or less.    Baseline Left FMC 41 sec visa the 9 hole peg test. 08/08/20: 32 seconds via 9 hole peg    Time 6    Period Weeks    Status Revised      OT SHORT TERM GOAL #7   Title Pt will demonstrate at least 25% finger flexion/ extension in RUE in prep for functional use.    Baseline Limited PROM, and active digit flexion, and extension. Improved AROM for right 2nd, and 3rd digits. significant limitations with the 4th, and 5th digits. 08/08/20: AROM digit MCP 2nd (80*), 3rd (85*), 4th (80*), 5th (90*)    Time 4    Period Weeks    Status On-going    Target Date 09/05/20             OT Long Term Goals - 08/08/20 1650      OT LONG TERM GOAL #1   Title Pt will perform all basic ADLs with supervision    Baseline Pt. requires minA from her husband. 08/08/20: Pt continues to require MIN A for grooming and dressing tasks.    Time 12    Period Weeks    Status On-going      OT LONG TERM GOAL #2    Title Pt will demonstrate 60* RUE shoulder flexion in prep for functional reach with pain less than or equal to 3/10.    Baseline Pt. conitnues to present with limited AROM for isolated  right shoulder flexion with pain 2. 08/08/20: R AROM Shoulder flexion - 45* c 3/10 pain.    Time 12    Period Weeks    Status Revised      OT LONG TERM GOAL #  3   Title Pt will use RUE as a stabilizer/ gross A at least 30 % of the time for ADLs/ IADLs.    Baseline Pt. is using her right hand as a gross stabilizer 15% of the time. 08/08/20: requires MAX cues    Time 12    Period Weeks    Status On-going      OT LONG TERM GOAL #4   Title Pt will increase LUE grip strength to 25 lbs or greater for increased ease with ADLs.    Baseline Pt. has improved with left grip strength. Pt.continues to work towards 25#. 08/08/20: L grip 19#    Time 12    Period Weeks    Status On-going      OT LONG TERM GOAL #5   Title Pt will perform simple home management/ snack prep with min A.    Baseline Pt. conitnues to try to help when she can, however her husband mostly perfroms these tasks. 08/08/20: Husband performs    Time 12    Period Weeks    Status On-going      OT LONG TERM GOAL #6   Title Pt will demonstrate ability to grasp/ release a cup 2/3 trials with RUE.    Baseline Pt. is initialing grasp, and active release of objects, however is unable to grasp/release a cup. 08/08/20: Able to grasp, unable to release x1 trial    Time 12    Period Weeks    Status On-going      OT LONG TERM GOAL #8   Title I with updated HEP.    Baseline Pt. requires assist, and cuing for HEP. 08/08/20: reports completing with husband assist for technique    Time 12    Period Weeks    Status Partially Met      OT LONG TERM GOAL  #9   TITLE Pt. will use her right hand to assist the left hand in searching for items in, and holding her purse.    Baseline Pt. is unable to perform. Pt requires MIN cues to continually use RUE to hold purse.     Time 12    Period Weeks    Status Partially Met      OT LONG TERM GOAL  #10   TITLE Pt. will use her right hand to assist the left to independently place eye glasses into a case.    Baseline Pt. has difficulty performing. 08/08/20: cues to incorporate RUE    Time 12    Period Weeks    Status Partially Met                 Plan - 08/17/20 1357    Clinical Impression Statement Pt. Is anticipating having her breast surgery on 08/30/2020 Pt. Requires moist heat prior to, and in conjunction with ROM. Pt. continues to engage her right UE during tasksat home, andcontinues to do homeexercises with her husband using a bar, as well as grasp/releasing tennis balls. Pt. presented with increased thumb ROM with no reports of pain or discomfort with ROM.Pt. continues to present with  Improving active digit grasp, and release with her right thumb, 2nd, and 3rd digits. Pt.continues to work on improving RUE ROM, and functional reaching in preparation for hand to face patterns for self-care tasks.Pt. Continues to work on improving RUE ROM, and functional hand use during ADL tasks.    OT Occupational Profile and History Detailed Assessment- Review of Records and additional review of physical, cognitive, psychosocial  history related to current functional performance    Occupational performance deficits (Please refer to evaluation for details): ADL's;IADL's;Rest and Sleep;Work;Play;Leisure    Body Structure / Function / Physical Skills ADL;Balance;Coordination;Decreased knowledge of precautions;Decreased knowledge of use of DME;Dexterity;Edema;Mobility;Tone;Strength;IADL;Sensation;GMC;Gait;ROM;FMC;Flexibility;Pain;Vision;UE functional use;Endurance    Rehab Potential Good    Clinical Decision Making Several treatment options, min-mod task modification necessary    Comorbidities Affecting Occupational Performance: None    Modification or Assistance to Complete Evaluation  Min-Moderate modification of  tasks or assist with assess necessary to complete eval    OT Frequency 3x / week    OT Duration 12 weeks    OT Treatment/Interventions Self-care/ADL training;Ultrasound;Energy conservation;Visual/perceptual remediation/compensation;Patient/family education;DME and/or AE instruction;Aquatic Therapy;Paraffin;Gait Training;Passive range of motion;Balance training;Fluidtherapy;Electrical Stimulation;Functional Mobility Training;Splinting;Moist Heat;Therapeutic exercise;Manual Therapy;Cognitive remediation/compensation;Manual lymph drainage;Neuromuscular education;Coping strategies training    Consulted and Agree with Plan of Care Patient           Patient will benefit from skilled therapeutic intervention in order to improve the following deficits and impairments:   Body Structure / Function / Physical Skills: ADL,Balance,Coordination,Decreased knowledge of precautions,Decreased knowledge of use of DME,Dexterity,Edema,Mobility,Tone,Strength,IADL,Sensation,GMC,Gait,ROM,FMC,Flexibility,Pain,Vision,UE functional use,Endurance       Visit Diagnosis: Muscle weakness (generalized)  Other lack of coordination    Problem List Patient Active Problem List   Diagnosis Date Noted  . Dyspareunia in female 04/05/2020  . History of abnormal cervical Pap smear 03/10/2020  . GIB (gastrointestinal bleeding) 02/28/2020  . Elevated BUN   . Prediabetes   . 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 Cerebral vasospasm   . Sinus tachycardia   . Dysphagia, post-stroke   . Thrombocytopenia ([place])   . Acute blood loss anemia   . Brain aneurysm   . ICH (intracerebral hemorrhage) ([place]) 01/05/2020  . History of adenomatous polyp of colon 02/07/2016  . 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 Fissure in ano 11/11/2014  . Hemorrhoids 11/11/2014  . Constipation 11/19/2013  . GERD (gastroesophageal reflux disease) 03/24/2013  . Irritable bowel syndrome with diarrhea 03/24/2013  . Herpes zoster  05/14/2005  . Abnormal Pap smear of cervix 05/15/1983  . History of cervical dysplasia 05/15/1983    Harrel Carina, MS, OTR/L 08/17/2020, 5:33 PM  [place] MAIN [facility] SERVICES [address] [place], [place], [address] Phone: [phone]   Fax:  [phone]  Name: KANNA DAFOE MRN: [id number] Date of Birth: Feb 02, 1956

## 2020-08-18 ENCOUNTER — Encounter: Payer: BC Managed Care – PPO | Admitting: Occupational Therapy

## 2020-08-23 ENCOUNTER — Encounter: Payer: BC Managed Care – PPO | Admitting: Occupational Therapy

## 2020-08-25 ENCOUNTER — Ambulatory Visit: Payer: BC Managed Care – PPO | Admitting: Occupational Therapy

## 2020-08-25 ENCOUNTER — Other Ambulatory Visit: Payer: Self-pay

## 2020-08-25 ENCOUNTER — Encounter: Payer: Self-pay | Admitting: Occupational Therapy

## 2020-08-25 DIAGNOSIS — R278 Other lack of coordination: Secondary | ICD-10-CM

## 2020-08-25 DIAGNOSIS — M6281 Muscle weakness (generalized): Secondary | ICD-10-CM | POA: Diagnosis not present

## 2020-08-26 ENCOUNTER — Other Ambulatory Visit: Payer: Self-pay

## 2020-08-26 ENCOUNTER — Ambulatory Visit: Payer: BC Managed Care – PPO | Admitting: Occupational Therapy

## 2020-08-26 ENCOUNTER — Encounter: Payer: Self-pay | Admitting: Occupational Therapy

## 2020-08-26 DIAGNOSIS — I69159 Hemiplegia and hemiparesis following nontraumatic intracerebral hemorrhage affecting unspecified side: Secondary | ICD-10-CM

## 2020-08-26 DIAGNOSIS — M6281 Muscle weakness (generalized): Secondary | ICD-10-CM

## 2020-08-26 DIAGNOSIS — R278 Other lack of coordination: Secondary | ICD-10-CM

## 2020-08-26 NOTE — Therapy (Signed)
[place] MAIN [facility] SERVICES [address] [place], [place], [address] Phone: [phone]   Fax:  [phone]  Occupational Therapy Treatment   Patient Details  Name: Erika Stanton MRN: [id number] Date of Birth: [birth year] Referring Provider (OT): Dr. Naaman Plummer   Encounter Date: 08/26/2020   OT End of Session - 08/26/20 1113    Visit Number 52    Number of Visits 60    Date for OT Re-Evaluation 08/30/20    Authorization Type Progress report period starting 08/08/2020    OT Start Time 1115    OT Stop Time 1159    OT Time Calculation (min) 44 min    Activity Tolerance Patient tolerated treatment well    Behavior During Therapy [facility] for tasks assessed/performed           Past Medical History:  Diagnosis Date  . Aneurysm ([place])   . Melena     Past Surgical History:  Procedure Laterality Date  . CHOLECYSTECTOMY    . COLONOSCOPY  11/2017   at [facility]. no recurrent polyps.  suggest repeat surveillance study 11/2022.    Marland Kitchen COLONOSCOPY W/ POLYPECTOMY  06/2014   Dr Roxy Manns at [facility].  3 adenomatoous polyps, anal fissure.    . ESOPHAGOGASTRODUODENOSCOPY (EGD) WITH PROPOFOL N/A 01/27/2020   Procedure: ESOPHAGOGASTRODUODENOSCOPY (EGD) WITH PROPOFOL;  Surgeon: Mauri Pole, MD;  Location: [place] [facility];  Service: Endoscopy;  Laterality: N/A;  . IR 3D INDEPENDENT WKST  01/06/2020  . IR ANGIO INTRA EXTRACRAN SEL INTERNAL CAROTID BILAT MOD SED  01/06/2020  . IR ANGIO VERTEBRAL SEL VERTEBRAL UNI L MOD SED  01/06/2020  . IR ANGIOGRAM FOLLOW UP STUDY  01/06/2020  . IR ANGIOGRAM FOLLOW UP STUDY  01/06/2020  . IR ANGIOGRAM FOLLOW UP STUDY  01/06/2020  . IR ANGIOGRAM FOLLOW UP STUDY  01/06/2020  . IR ANGIOGRAM FOLLOW UP STUDY  01/06/2020  . IR ANGIOGRAM FOLLOW UP STUDY  01/06/2020  . IR ANGIOGRAM FOLLOW UP STUDY  01/06/2020  . IR ANGIOGRAM FOLLOW UP STUDY  01/06/2020  . IR ANGIOGRAM FOLLOW UP STUDY  01/06/2020  . IR ANGIOGRAM FOLLOW UP STUDY  01/06/2020  . IR  NEURO EACH ADD'L AFTER BASIC UNI RIGHT (MS)  01/06/2020  . IR TRANSCATH/EMBOLIZ  01/06/2020  . RADIOLOGY WITH ANESTHESIA N/A 01/06/2020   Procedure: IR WITH ANESTHESIA FOR ANEURYSM;  Surgeon: Consuella Lose, MD;  Location: [place];  Service: Radiology;  Laterality: N/A;  . TUBAL LIGATION      There were no vitals filed for this visit.   Subjective Assessment - 08/26/20 1113    Subjective  Pt reports she is completing exercises at home and anxious for surgery next week to be over with.    Pertinent History ICH 01/06/20, Aneurysm. PMH: OA bilateral knees and hands    Repetition Decreases Symptoms    Patient Stated Goals Pt would like to be as independent as possible.    Currently in Pain? Yes    Pain Score 3     Pain Location Shoulder    Pain Orientation Right    Pain Descriptors / Indicators Aching    Pain Type Chronic pain    Pain Onset More than a month ago    Pain Frequency Intermittent    Pain Onset More than a month ago            Modalities Moist heat applied to right elbow, forearm and hand for 5 minutes prior to therapeutic exercises to increase  range of motion, tissue mobility and decrease pain. Skin inspected prior to and after heat with no issues.  Therapeutic Exercise Pt performed seated body weight exercises for UE strengthening secondary to weakness. 1 set x 10 reps R shoulder shrugs, scapular retraction/protraction, and finger flexion/abduction/adduction. Pt tolerated PROM in all joint ranges of the R hand including wrist flexion/ extension and digit MP flexion/extension. Pt tolerated placing x3 each yellow and red clothespins onto dowel for sustained pinch.  Neuromuscular Re-education Pt grasped objects of varying size (3 inch balls to 1 inch cubes) from tupperware and placed in bucket on R side. Pt successfully placed 14/20 without dropping prematurely. Pt reported pain with R shoulder abduction to place objects in bucket ~1 ft to the R, decreased to ~6 inches after  initial 5 trials.       OT Education - 08/26/20 1113    Education Details ROM, pain control, strength, finger ROM, functional tasks at home    Person(s) Educated Patient    Methods Explanation;Demonstration;Verbal cues;Handout    Comprehension Verbalized understanding;Returned demonstration;Verbal cues required            OT Short Term Goals - 08/08/20 1626      OT SHORT TERM GOAL #4   Title Pt will demonstrate 60* A/ROM shoulder flexion for RUE with pain less than or equal to 4/10.    Baseline Pt. presents with limited isolated  right shoulder flexion with pain continues to be 2. 08/08/20: 40* AROM with 3/10 pain    Time 4    Period Weeks    Status Revised    Target Date 09/05/20      OT SHORT TERM GOAL #5   Title Pt will demonstrate improved LUE fine motor coordination for ADLs as evidenced by decreasing 9 hole peg test score to 25 secs or less.    Baseline Left FMC 41 sec visa the 9 hole peg test. 08/08/20: 32 seconds via 9 hole peg    Time 6    Period Weeks    Status Revised      OT SHORT TERM GOAL #7   Title Pt will demonstrate at least 25% finger flexion/ extension in RUE in prep for functional use.    Baseline Limited PROM, and active digit flexion, and extension. Improved AROM for right 2nd, and 3rd digits. significant limitations with the 4th, and 5th digits. 08/08/20: AROM digit MCP 2nd (80*), 3rd (85*), 4th (80*), 5th (90*)    Time 4    Period Weeks    Status On-going    Target Date 09/05/20             OT Long Term Goals - 08/08/20 1650      OT LONG TERM GOAL #1   Title Pt will perform all basic ADLs with supervision    Baseline Pt. requires minA from her husband. 08/08/20: Pt continues to require MIN A for grooming and dressing tasks.    Time 12    Period Weeks    Status On-going      OT LONG TERM GOAL #2   Title Pt will demonstrate 60* RUE shoulder flexion in prep for functional reach with pain less than or equal to 3/10.    Baseline Pt. conitnues to  present with limited AROM for isolated  right shoulder flexion with pain 2. 08/08/20: R AROM Shoulder flexion - 45* c 3/10 pain.    Time 12    Period Weeks    Status Revised  OT LONG TERM GOAL #3   Title Pt will use RUE as a stabilizer/ gross A at least 30 % of the time for ADLs/ IADLs.    Baseline Pt. is using her right hand as a gross stabilizer 15% of the time. 08/08/20: requires MAX cues    Time 12    Period Weeks    Status On-going      OT LONG TERM GOAL #4   Title Pt will increase LUE grip strength to 25 lbs or greater for increased ease with ADLs.    Baseline Pt. has improved with left grip strength. Pt.continues to work towards 25#. 08/08/20: L grip 19#    Time 12    Period Weeks    Status On-going      OT LONG TERM GOAL #5   Title Pt will perform simple home management/ snack prep with min A.    Baseline Pt. conitnues to try to help when she can, however her husband mostly perfroms these tasks. 08/08/20: Husband performs    Time 12    Period Weeks    Status On-going      OT LONG TERM GOAL #6   Title Pt will demonstrate ability to grasp/ release a cup 2/3 trials with RUE.    Baseline Pt. is initialing grasp, and active release of objects, however is unable to grasp/release a cup. 08/08/20: Able to grasp, unable to release x1 trial    Time 12    Period Weeks    Status On-going      OT LONG TERM GOAL #8   Title I with updated HEP.    Baseline Pt. requires assist, and cuing for HEP. 08/08/20: reports completing with husband assist for technique    Time 12    Period Weeks    Status Partially Met      OT LONG TERM GOAL  #9   TITLE Pt. will use her right hand to assist the left hand in searching for items in, and holding her purse.    Baseline Pt. is unable to perform. Pt requires MIN cues to continually use RUE to hold purse.    Time 12    Period Weeks    Status Partially Met      OT LONG TERM GOAL  #10   TITLE Pt. will use her right hand to assist the left to  independently place eye glasses into a case.    Baseline Pt. has difficulty performing. 08/08/20: cues to incorporate RUE    Time 12    Period Weeks    Status Partially Met                 Plan - 08/26/20 1114    Clinical Impression Statement Pt reports that Ronalee Belts assists with exercises and dressing. Pt improving ability to release objects from grasp. Continues to demonstrate decreased ROM of right shoulder, pain present. Pt will need a new order after surgery to continue, she is aware of this. Will also plan to reassess patient after surgery for her recertification.  Continue OT towards goals in plan of care to increase independence in daily tasks.    OT Occupational Profile and History Detailed Assessment- Review of Records and additional review of physical, cognitive, psychosocial history related to current functional performance    Occupational performance deficits (Please refer to evaluation for details): ADL's;IADL's;Rest and Sleep;Work;Play;Leisure    Body Structure / Function / Physical Skills ADL;Balance;Coordination;Decreased knowledge of precautions;Decreased knowledge of use of DME;Dexterity;Edema;Mobility;Tone;Strength;IADL;Sensation;GMC;Gait;ROM;FMC;Flexibility;Pain;Vision;UE functional use;Endurance  Rehab Potential Good    Clinical Decision Making Several treatment options, min-mod task modification necessary    Comorbidities Affecting Occupational Performance: None    Modification or Assistance to Complete Evaluation  Min-Moderate modification of tasks or assist with assess necessary to complete eval    OT Frequency 3x / week    OT Duration 12 weeks    OT Treatment/Interventions Self-care/ADL training;Ultrasound;Energy conservation;Visual/perceptual remediation/compensation;Patient/family education;DME and/or AE instruction;Aquatic Therapy;Paraffin;Gait Training;Passive range of motion;Balance training;Fluidtherapy;Electrical Stimulation;Functional Mobility  Training;Splinting;Moist Heat;Therapeutic exercise;Manual Therapy;Cognitive remediation/compensation;Manual lymph drainage;Neuromuscular education;Coping strategies training    Consulted and Agree with Plan of Care Patient           Patient will benefit from skilled therapeutic intervention in order to improve the following deficits and impairments:   Body Structure / Function / Physical Skills: ADL,Balance,Coordination,Decreased knowledge of precautions,Decreased knowledge of use of DME,Dexterity,Edema,Mobility,Tone,Strength,IADL,Sensation,GMC,Gait,ROM,FMC,Flexibility,Pain,Vision,UE functional use,Endurance       Visit Diagnosis: Other lack of coordination  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 Dyspareunia in female 04/05/2020  . History of abnormal cervical Pap smear 03/10/2020  . GIB (gastrointestinal bleeding) 02/28/2020  . Elevated BUN   . Prediabetes   . 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 Cerebral vasospasm   . Sinus tachycardia   . Dysphagia, post-stroke   . Thrombocytopenia ([place])   . Acute blood loss anemia   . Brain aneurysm   . ICH (intracerebral hemorrhage) ([place]) 01/05/2020  . History of adenomatous polyp of colon 02/07/2016  . 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 Fissure in ano 11/11/2014  . Hemorrhoids 11/11/2014  . Constipation 11/19/2013  . GERD (gastroesophageal reflux disease) 03/24/2013  . Irritable bowel syndrome with diarrhea 03/24/2013  . Herpes zoster 05/14/2005  . Abnormal Pap smear of cervix 05/15/1983  . History of cervical dysplasia 05/15/1983   Dessie Coma, M.S. OTR/L  08/26/20, 12:20 PM  ascom [phone]  [place] MAIN [facility] SERVICES [address] [place], [place], [address] Phone: [phone]   Fax:   [phone]  Name: ROBYN GALATI MRN: [id number] Date of Birth: 10-Dec-1955

## 2020-08-26 NOTE — Therapy (Signed)
[facility] REHAB SERVICES [address] [place], [place], [address] Phone: [phone]   Fax:  [phone]  Occupational Therapy Treatment  Patient Details  Name: Erika Stanton MRN: [id number] Date of Birth: 08/22/1955 Referring Provider (OT): Dr. Swartz   Encounter Date: 08/25/2020   OT End of Session - 08/26/20 0922    Visit Number 55    Number of Visits 60    Date for OT Re-Evaluation 08/30/20    Authorization Type Progress report period starting 08/08/2020    OT Start Time 1303    OT Stop Time 1347    OT Time Calculation (min) 44 min    Activity Tolerance Patient tolerated treatment well    Behavior During Therapy WFL for tasks assessed/performed           Past Medical History:  Diagnosis Date  . Aneurysm (HCC)   . Melena     Past Surgical History:  Procedure Laterality Date  . CHOLECYSTECTOMY    . COLONOSCOPY  11/2017   at [facility]. no recurrent polyps.  suggest repeat surveillance study 11/2022.    . COLONOSCOPY W/ POLYPECTOMY  06/2014   Dr Brazer at [facility].  3 adenomatoous polyps, anal fissure.    . ESOPHAGOGASTRODUODENOSCOPY (EGD) WITH PROPOFOL N/A 01/27/2020   Procedure: ESOPHAGOGASTRODUODENOSCOPY (EGD) WITH PROPOFOL;  Surgeon: Nandigam, Kavitha V, MD;  Location: [facility];  Service: Endoscopy;  Laterality: N/A;  . IR 3D INDEPENDENT WKST  01/06/2020  . IR ANGIO INTRA EXTRACRAN SEL INTERNAL CAROTID BILAT MOD SED  01/06/2020  . IR ANGIO VERTEBRAL SEL VERTEBRAL UNI L MOD SED  01/06/2020  . IR ANGIOGRAM FOLLOW UP STUDY  01/06/2020  . IR ANGIOGRAM FOLLOW UP STUDY  01/06/2020  . IR ANGIOGRAM FOLLOW UP STUDY  01/06/2020  . IR ANGIOGRAM FOLLOW UP STUDY  01/06/2020  . IR ANGIOGRAM FOLLOW UP STUDY  01/06/2020  . IR ANGIOGRAM FOLLOW UP STUDY  01/06/2020  . IR ANGIOGRAM FOLLOW UP STUDY  01/06/2020  . IR ANGIOGRAM FOLLOW UP STUDY  01/06/2020  . IR ANGIOGRAM FOLLOW UP STUDY  01/06/2020  . IR ANGIOGRAM FOLLOW UP STUDY  01/06/2020  . IR NEURO  EACH ADD'L AFTER BASIC UNI RIGHT (MS)  01/06/2020  . IR TRANSCATH/EMBOLIZ  01/06/2020  . RADIOLOGY WITH ANESTHESIA N/A 01/06/2020   Procedure: IR WITH ANESTHESIA FOR ANEURYSM;  Surgeon: Nundkumar, Neelesh, MD;  Location: [facility] OR;  Service: Radiology;  Laterality: N/A;  . TUBAL LIGATION      There were no vitals filed for this visit.   Subjective Assessment - 08/26/20 0920    Subjective  Pt reports she will have her lumpectomy next Tuesday, feeling anxious but ready to get it over with.    Pertinent History ICH 01/06/20, Aneurysm. PMH: OA bilateral knees and hands    Patient Stated Goals Pt would like to be as independent as possible.    Currently in Pain? Yes    Pain Score 3     Pain Location Shoulder    Pain Orientation Right    Pain Descriptors / Indicators Aching    Pain Onset More than a month ago           Moist heat for 5 mins prior to therapeutic exercises to increase ROM, decrease pain.   Therapeutic Exercises: Pt. Seen for focus on AAROM/PROMfor shoulder flexion, abduction,elbow flexion, elbow extension,AAROM supinationPROM for right wrist, digit MP, PIP, and DIP flexion, and extension, AROM digit MP, PIP, and DIP flexion,extension.Thumb radial,   and palmar abduction, Thumb IP flexion,and extensionin preparation for grasping objects.  UBE in standing for 6 mins total, no rest breaks, resistance from 0 to 1.0 forwards, no resistance backwards.  Patient performing forwards/backwards, alternating levels of resistance with therapist in constant attendance to ensure grip and to adjust settings.    Continue to encourage active use of right UE for functional grasping, hand to mouth patterns.    Response to tx: Patient with upcoming surgery next week for a lumpectomy and is feeling anxious.  Still doing exercises at home but reports it is harder to focus on her right UE with all her medical issues presently.  Continues to demonstrate decreased ROM of right shoulder, pain present.   She is consistent with doing exercises at home with her husband.  Pt will need a new order after surgery to continue, she is aware of this. Will also plan to reassess patient after surgery for her recertification.  Continue OT towards goals in plan of care to increase independence in daily tasks.                     OT Education - 08/26/20 0921    Education Details ROM, pain control, strength, finger ROM, functional tasks at home    Person(s) Educated Patient    Methods Explanation;Demonstration;Verbal cues;Handout    Comprehension Verbalized understanding;Returned demonstration;Verbal cues required            OT Short Term Goals - 08/08/20 1626      OT SHORT TERM GOAL #4   Title Pt will demonstrate 60* A/ROM shoulder flexion for RUE with pain less than or equal to 4/10.    Baseline Pt. presents with limited isolated  right shoulder flexion with pain continues to be 2. 08/08/20: 40* AROM with 3/10 pain    Time 4    Period Weeks    Status Revised    Target Date 09/05/20      OT SHORT TERM GOAL #5   Title Pt will demonstrate improved LUE fine motor coordination for ADLs as evidenced by decreasing 9 hole peg test score to 25 secs or less.    Baseline Left FMC 41 sec visa the 9 hole peg test. 08/08/20: 32 seconds via 9 hole peg    Time 6    Period Weeks    Status Revised      OT SHORT TERM GOAL #7   Title Pt will demonstrate at least 25% finger flexion/ extension in RUE in prep for functional use.    Baseline Limited PROM, and active digit flexion, and extension. Improved AROM for right 2nd, and 3rd digits. significant limitations with the 4th, and 5th digits. 08/08/20: AROM digit MCP 2nd (80*), 3rd (85*), 4th (80*), 5th (90*)    Time 4    Period Weeks    Status On-going    Target Date 09/05/20             OT Long Term Goals - 08/08/20 1650      OT LONG TERM GOAL #1   Title Pt will perform all basic ADLs with supervision    Baseline Pt. requires minA from her  husband. 08/08/20: Pt continues to require MIN A for grooming and dressing tasks.    Time 12    Period Weeks    Status On-going      OT LONG TERM GOAL #2   Title Pt will demonstrate 60* RUE shoulder flexion in prep for functional reach with pain   less than or equal to 3/10.    Baseline Pt. conitnues to present with limited AROM for isolated  right shoulder flexion with pain 2. 08/08/20: R AROM Shoulder flexion - 45* c 3/10 pain.    Time 12    Period Weeks    Status Revised      OT LONG TERM GOAL #3   Title Pt will use RUE as a stabilizer/ gross A at least 30 % of the time for ADLs/ IADLs.    Baseline Pt. is using her right hand as a gross stabilizer 15% of the time. 08/08/20: requires MAX cues    Time 12    Period Weeks    Status On-going      OT LONG TERM GOAL #4   Title Pt will increase LUE grip strength to 25 lbs or greater for increased ease with ADLs.    Baseline Pt. has improved with left grip strength. Pt.continues to work towards 25#. 08/08/20: L grip 19#    Time 12    Period Weeks    Status On-going      OT LONG TERM GOAL #5   Title Pt will perform simple home management/ snack prep with min A.    Baseline Pt. conitnues to try to help when she can, however her husband mostly perfroms these tasks. 08/08/20: Husband performs    Time 12    Period Weeks    Status On-going      OT LONG TERM GOAL #6   Title Pt will demonstrate ability to grasp/ release a cup 2/3 trials with RUE.    Baseline Pt. is initialing grasp, and active release of objects, however is unable to grasp/release a cup. 08/08/20: Able to grasp, unable to release x1 trial    Time 12    Period Weeks    Status On-going      OT LONG TERM GOAL #8   Title I with updated HEP.    Baseline Pt. requires assist, and cuing for HEP. 08/08/20: reports completing with husband assist for technique    Time 12    Period Weeks    Status Partially Met      OT LONG TERM GOAL  #9   TITLE Pt. will use her right hand to assist the  left hand in searching for items in, and holding her purse.    Baseline Pt. is unable to perform. Pt requires MIN cues to continually use RUE to hold purse.    Time 12    Period Weeks    Status Partially Met      OT LONG TERM GOAL  #10   TITLE Pt. will use her right hand to assist the left to independently place eye glasses into a case.    Baseline Pt. has difficulty performing. 08/08/20: cues to incorporate RUE    Time 12    Period Weeks    Status Partially Met                 Plan - 08/26/20 0922    Clinical Impression Statement Patient with upcoming surgery next week for a lumpectomy and is feeling anxious.  Still doing exercises at home but reports it is harder to focus on her right UE with all her medical issues presently.  Continues to demonstrate decreased ROM of right shoulder, pain present.  She is consistent with doing exercises at home with her husband.  Pt will need a new order after surgery to continue, she is aware of   this. Will also plan to reassess patient after surgery for her recertification.  Continue OT towards goals in plan of care to increase independence in daily tasks.    OT Occupational Profile and History Detailed Assessment- Review of Records and additional review of physical, cognitive, psychosocial history related to current functional performance    Occupational performance deficits (Please refer to evaluation for details): ADL's;IADL's;Rest and Sleep;Work;Play;Leisure    Body Structure / Function / Physical Skills ADL;Balance;Coordination;Decreased knowledge of precautions;Decreased knowledge of use of DME;Dexterity;Edema;Mobility;Tone;Strength;IADL;Sensation;GMC;Gait;ROM;FMC;Flexibility;Pain;Vision;UE functional use;Endurance    Rehab Potential Good    Clinical Decision Making Several treatment options, min-mod task modification necessary    Comorbidities Affecting Occupational Performance: None    Modification or Assistance to Complete Evaluation   Min-Moderate modification of tasks or assist with assess necessary to complete eval    OT Frequency 3x / week    OT Duration 12 weeks    OT Treatment/Interventions Self-care/ADL training;Ultrasound;Energy conservation;Visual/perceptual remediation/compensation;Patient/family education;DME and/or AE instruction;Aquatic Therapy;Paraffin;Gait Training;Passive range of motion;Balance training;Fluidtherapy;Electrical Stimulation;Functional Mobility Training;Splinting;Moist Heat;Therapeutic exercise;Manual Therapy;Cognitive remediation/compensation;Manual lymph drainage;Neuromuscular education;Coping strategies training    Consulted and Agree with Plan of Care Patient           Patient will benefit from skilled therapeutic intervention in order to improve the following deficits and impairments:   Body Structure / Function / Physical Skills: ADL,Balance,Coordination,Decreased knowledge of precautions,Decreased knowledge of use of DME,Dexterity,Edema,Mobility,Tone,Strength,IADL,Sensation,GMC,Gait,ROM,FMC,Flexibility,Pain,Vision,UE functional use,Endurance       Visit Diagnosis: Muscle weakness (generalized)  Other lack of coordination    Problem List Patient Active Problem List   Diagnosis Date Noted  . Dyspareunia in female 04/05/2020  . History of abnormal cervical Pap smear 03/10/2020  . GIB (gastrointestinal bleeding) 02/28/2020  . Elevated BUN   . Prediabetes   . 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 Cerebral vasospasm   . Sinus tachycardia   . Dysphagia, post-stroke   . Thrombocytopenia (HCC)   . Acute blood loss anemia   . Brain aneurysm   . ICH (intracerebral hemorrhage) (HCC) 01/05/2020  . History of adenomatous polyp of colon 02/07/2016  . 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 Fissure in ano 11/11/2014  . Hemorrhoids 11/11/2014  . Constipation 11/19/2013  . GERD (gastroesophageal reflux disease) 03/24/2013  . Irritable bowel syndrome with diarrhea  03/24/2013  . Herpes zoster 05/14/2005  . Abnormal Pap smear of cervix 05/15/1983  . History of cervical dysplasia 05/15/1983   Amy T Lovett, OTR/L, CLT  Lovett,Amy 08/26/2020, 10:20 AM   [facility] SERVICES [address] [place], [place], [address] Phone: [phone]   Fax:  [phone]  Name: Veryl B Berryman MRN: [id number] Date of Birth: 12/26/1955 

## 2020-08-29 ENCOUNTER — Encounter: Payer: BC Managed Care – PPO | Admitting: Occupational Therapy

## 2020-08-31 ENCOUNTER — Encounter: Payer: BC Managed Care – PPO | Admitting: Occupational Therapy

## 2020-09-02 ENCOUNTER — Ambulatory Visit: Payer: BC Managed Care – PPO | Admitting: Occupational Therapy

## 2020-09-06 ENCOUNTER — Ambulatory Visit: Payer: BC Managed Care – PPO | Admitting: Occupational Therapy

## 2020-09-08 ENCOUNTER — Ambulatory Visit: Payer: BC Managed Care – PPO | Admitting: Occupational Therapy

## 2020-09-09 ENCOUNTER — Ambulatory Visit: Payer: BC Managed Care – PPO | Admitting: Occupational Therapy

## 2020-09-12 ENCOUNTER — Ambulatory Visit: Payer: BC Managed Care – PPO | Attending: Physical Medicine & Rehabilitation | Admitting: Occupational Therapy

## 2020-09-12 ENCOUNTER — Encounter: Payer: Self-pay | Admitting: Occupational Therapy

## 2020-09-12 DIAGNOSIS — I69159 Hemiplegia and hemiparesis following nontraumatic intracerebral hemorrhage affecting unspecified side: Secondary | ICD-10-CM | POA: Diagnosis present

## 2020-09-12 DIAGNOSIS — R278 Other lack of coordination: Secondary | ICD-10-CM | POA: Diagnosis present

## 2020-09-12 DIAGNOSIS — M6281 Muscle weakness (generalized): Secondary | ICD-10-CM | POA: Insufficient documentation

## 2020-09-12 NOTE — Therapy (Signed)
[place] MAIN [facility] SERVICES [address] [place], [place], [address] Phone: [phone]   Fax:  [phone]  Occupational Therapy Treatment/Recertification Note  Patient Details  Name: Erika Stanton MRN: [id number] Date of Birth: 01/17/1956 Referring Provider (OT): Dr. Naaman Plummer   Encounter Date: 09/12/2020   OT End of Session - 09/12/20 1517    Visit Number 51    Number of Visits 60    Date for OT Re-Evaluation 12/05/20    Authorization Type Progress report period starting 08/08/2020    OT Start Time 1300    OT Stop Time 1345    OT Time Calculation (min) 45 min    Activity Tolerance Patient tolerated treatment well    Behavior During Therapy [facility] for tasks assessed/performed           Past Medical History:  Diagnosis Date  . Aneurysm ([place])   . Melena     Past Surgical History:  Procedure Laterality Date  . CHOLECYSTECTOMY    . COLONOSCOPY  11/2017   at [facility]. no recurrent polyps.  suggest repeat surveillance study 11/2022.    Marland Kitchen COLONOSCOPY W/ POLYPECTOMY  06/2014   Dr Roxy Manns at [facility].  3 adenomatoous polyps, anal fissure.    . ESOPHAGOGASTRODUODENOSCOPY (EGD) WITH PROPOFOL N/A 01/27/2020   Procedure: ESOPHAGOGASTRODUODENOSCOPY (EGD) WITH PROPOFOL;  Surgeon: Mauri Pole, MD;  Location: [facility];  Service: Endoscopy;  Laterality: N/A;  . IR 3D INDEPENDENT WKST  01/06/2020  . IR ANGIO INTRA EXTRACRAN SEL INTERNAL CAROTID BILAT MOD SED  01/06/2020  . IR ANGIO VERTEBRAL SEL VERTEBRAL UNI L MOD SED  01/06/2020  . IR ANGIOGRAM FOLLOW UP STUDY  01/06/2020  . IR ANGIOGRAM FOLLOW UP STUDY  01/06/2020  . IR ANGIOGRAM FOLLOW UP STUDY  01/06/2020  . IR ANGIOGRAM FOLLOW UP STUDY  01/06/2020  . IR ANGIOGRAM FOLLOW UP STUDY  01/06/2020  . IR ANGIOGRAM FOLLOW UP STUDY  01/06/2020  . IR ANGIOGRAM FOLLOW UP STUDY  01/06/2020  . IR ANGIOGRAM FOLLOW UP STUDY  01/06/2020  . IR ANGIOGRAM FOLLOW UP STUDY  01/06/2020  . IR ANGIOGRAM FOLLOW UP STUDY   01/06/2020  . IR NEURO EACH ADD'L AFTER BASIC UNI RIGHT (MS)  01/06/2020  . IR TRANSCATH/EMBOLIZ  01/06/2020  . RADIOLOGY WITH ANESTHESIA N/A 01/06/2020   Procedure: IR WITH ANESTHESIA FOR ANEURYSM;  Surgeon: Consuella Lose, MD;  Location: [facility];  Service: Radiology;  Laterality: N/A;  . TUBAL LIGATION      There were no vitals filed for this visit.   Subjective Assessment - 09/12/20 1328    Subjective  Pt.reports that she is still sore after having a lumpectomy.    Patient is accompanied by: Family member    Pertinent History ICH 01/06/20, Aneurysm. PMH: OA bilateral knees and hands    Repetition Decreases Symptoms    Patient Stated Goals Pt would like to be as independent as possible.    Currently in Pain? Yes    Pain Score 2     Pain Location Shoulder    Pain Orientation Right    Pain Descriptors / Indicators Aching    Pain Score 3    Pain Location Breast    Pain Orientation Left    Pain Descriptors / Indicators Sore    Pain Type Acute pain              [facility] OT Assessment - 09/12/20 1639      AROM   Overall AROM  Comments Right shoulder flexion attempts result in 74 degrees of compensatory scaption, shoulder abduction: 65(88), elbow 19-132, wrist flexion 34, extension 58, limited 4th,a nd 5th digit extension due to flexor tightness.            Pt. has returned to the clinic following Left breast lumpectomy surgery. Pt. presented orders from the physician to resume OT services. Pt. has had multiple aneurym surgeries recently, and has to schedule to have a 3rd one. Pt. has had aneurym surgery, and breast surgery during this past recertification period. Pt. continues with OT services while focusing on the same POC, and goals. Pt. reports that she plans to have her 1st Botox injection for her right hand soon. Pt. continues to present with limited RUE tightness limiting shoulder, elbow, wrist, and hand movements. Pt. is now able to engage her hand in initiating grasping, and  releasing thinner objects consistently. Pt. Is now engaging more with homemaking tasks loading the dishwasher, and assisting with the laundry. Pt. Continues with OT services to work on normalizing RUE tone, improving pain, and facilitating active volitional movement needed for actively grasping, and releasing objects during ADLs, and IADL tasks, maximizing functional hand use, and independence.                    OT Education - 09/12/20 1516    Education Details RUE functioning, POC and goals.    Person(s) Educated Patient    Methods Explanation;Demonstration;Verbal cues;Handout    Comprehension Verbalized understanding;Returned demonstration;Verbal cues required            OT Short Term Goals - 09/12/20 1621      OT SHORT TERM GOAL #4   Title Pt will demonstrate 60* A/ROM shoulder flexion for RUE with pain less than or equal to 4/10.    Baseline Pt. presents with limited isolated  right shoulder flexion with pain continues to be 2. 08/08/20: 40* AROM with 3/10 pain    Time 4    Status Deferred      OT SHORT TERM GOAL #5   Title Pt will demonstrate improved LUE fine motor coordination for ADLs as evidenced by decreasing 9 hole peg test score to 25 secs or less.    Baseline Left FMC 41 sec visa the 9 hole peg test. 08/08/20: 32 seconds via 9 hole peg    Time 6    Period Weeks    Status On-going    Target Date 12/05/20      OT SHORT TERM GOAL #7   Title Pt will demonstrate at least 25% finger flexion/ extension in RUE in prep for functional use.    Baseline Limited PROM, and active digit flexion, and extension. Improved AROM for right 2nd, and 3rd digits. significant limitations with the 4th, and 5th digits. 08/08/20: AROM digit MCP 2nd (80*), 3rd (85*), 4th (80*), 5th (90*)    Time 4    Period Weeks    Status Deferred             OT Long Term Goals - 09/12/20 1625      OT LONG TERM GOAL #1   Title Pt will perform all basic ADLs with supervision    Baseline  09/12/2020: Pt. requires minA from hr husaband following multiple recent surgeries. Pt. requires minA from her husband. 08/08/20: Pt continues to require MIN A for grooming and dressing tasks.    Time 12    Period Weeks    Target Date 12/05/20  OT LONG TERM GOAL #2   Baseline 09/12/2020: Pt. contitnues to present with limited AROM for isolated  right shoulder flexion with pain 2 . 08/08/20: R AROM Shoulder flexion - 45* c 3/10 pain.    Time 12    Period Weeks    Status Revised    Target Date 12/05/20      OT LONG TERM GOAL #3   Title Pt will use RUE as a stabilizer/ gross A at least 30 % of the time for ADLs/ IADLs.    Baseline Pt. is using her right hand as a gross stabilizer 15% of the time. 08/08/20: requires MAX cues    Time 12    Period Weeks    Status On-going    Target Date 12/05/20      OT LONG TERM GOAL #4   Title Pt will increase LUE grip strength to 25 lbs or greater for increased ease with ADLs.    Baseline 09/12/2020:Pt. conitnues to present with limited left grip. Pt. has improved with left grip strength. Pt.continues to work towards 25#. 08/08/20: L grip 19#    Time 12    Period Weeks    Status On-going    Target Date 12/05/20      OT LONG TERM GOAL #5   Title Pt will perform simple home management/ snack prep with min A.    Baseline 09/12/2020: Pt. assists with filling the dishwasher, and performing laundry. Pt. continues to try to help when she can, however her husband mostly performs these tasks. 08/08/20: Husband performs    Time 12    Period Weeks    Status On-going    Target Date 12/05/20      OT LONG TERM GOAL #6   Title Pt will demonstrate ability to grasp/ release a cup 2/3 trials with RUE.    Baseline 09/12/2020: Pt. continues to initiate grasp, and active release of objects, however is unable to grasp/release a cup. 08/08/20: Able to grasp, unable to release x1 trial    Time 12    Period Weeks    Status On-going    Target Date 12/05/20      OT LONG TERM  GOAL #8   Title I with updated HEP.    Baseline 09/12/2020: Pt. continues to require assist for her HEP. Pt. requires assist, and cuing for HEP. 08/08/20: reports completing with husband assist for technique    Time 12    Period Weeks    Target Date 09/12/20      OT LONG TERM GOAL  #9   TITLE Pt. will use her right hand to assist the left hand in searching for items in, and holding her purse.    Baseline 09/12/2020: Pt requires MIN cues to continually use RUE to hold purse.    Time 12    Period Weeks    Status On-going    Target Date 12/05/20      OT LONG TERM GOAL  #10   TITLE Pt. will use her right hand to assist the left to independently place eye glasses into a case.    Baseline Pt. has difficulty performing. 08/08/20: cues to incorporate RUE    Time 12    Period Weeks    Status Partially Met    Target Date 12/05/20                 Plan - 09/12/20 1518    Clinical Impression Statement Pt. has returned to the clinic following Left  breast lumpectomy surgery. Pt. presented orders from the physician to resume OT services. Pt. has had multiple aneurym surgeries recently, and has to schedule to have a 3rd one. Pt. has had aneurym surgery, and breast surgery during this past recertification period. Pt. continues with OT services while focusing on the same POC, and goals. Pt. reports that she plans to have her 1st Botox injection for her right hand soon. Pt. continues to present with limited RUE tightness limiting shoulder, elbow, wrist, and hand movements. Pt. is now able to engage her hand in initiating grasping, and releasing thinner objects consistently. Pt. Is now engaging more with homemaking tasks loading the dishwasher, and assisting with the laundry. Pt. Continues with OT services to work on normalizing RUE tone, improving pain, and facilitating active volitional movement needed for actively grasping, and releasing objects during ADLs, and IADL tasks, maximizing functional hand use,  and independence.   OT Occupational Profile and History Detailed Assessment- Review of Records and additional review of physical, cognitive, psychosocial history related to current functional performance    Occupational performance deficits (Please refer to evaluation for details): ADL's;IADL's;Rest and Sleep;Work;Play;Leisure    Body Structure / Function / Physical Skills ADL;Balance;Coordination;Decreased knowledge of precautions;Decreased knowledge of use of DME;Dexterity;Edema;Mobility;Tone;Strength;IADL;Sensation;GMC;Gait;ROM;FMC;Flexibility;Pain;Vision;UE functional use;Endurance    Cognitive Skills Attention;Perception;Problem Solve;Safety Awareness;Thought    Rehab Potential Good    Clinical Decision Making Several treatment options, min-mod task modification necessary    Comorbidities Affecting Occupational Performance: None    Modification or Assistance to Complete Evaluation  Min-Moderate modification of tasks or assist with assess necessary to complete eval    OT Frequency 3x / week    OT Duration 12 weeks    OT Treatment/Interventions Self-care/ADL training;Ultrasound;Energy conservation;Visual/perceptual remediation/compensation;Patient/family education;DME and/or AE instruction;Aquatic Therapy;Paraffin;Gait Training;Passive range of motion;Balance training;Fluidtherapy;Electrical Stimulation;Functional Mobility Training;Splinting;Moist Heat;Therapeutic exercise;Manual Therapy;Cognitive remediation/compensation;Manual lymph drainage;Neuromuscular education;Coping strategies training    Plan Pt is transferring her care to [facility] as it is closer to home. She remains very fearful of moving RUE and responds well to heat, and estim.    Consulted and Agree with Plan of Care Patient           Patient will benefit from skilled therapeutic intervention in order to improve the following deficits and impairments:   Body Structure / Function / Physical Skills:  ADL,Balance,Coordination,Decreased knowledge of precautions,Decreased knowledge of use of DME,Dexterity,Edema,Mobility,Tone,Strength,IADL,Sensation,GMC,Gait,ROM,FMC,Flexibility,Pain,Vision,UE functional use,Endurance Cognitive Skills: Attention,Perception,Problem Solve,Safety Awareness,Thought     Visit Diagnosis: Muscle weakness (generalized)    Problem List Patient Active Problem List   Diagnosis Date Noted  . Dyspareunia in female 04/05/2020  . History of abnormal cervical Pap smear 03/10/2020  . GIB (gastrointestinal bleeding) 02/28/2020  . Elevated BUN   . Prediabetes   . 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 Cerebral vasospasm   . Sinus tachycardia   . Dysphagia, post-stroke   . Thrombocytopenia ([place])   . Acute blood loss anemia   . Brain aneurysm   . ICH (intracerebral hemorrhage) ([place]) 01/05/2020  . History of adenomatous polyp of colon 02/07/2016  . 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 Fissure in ano 11/11/2014  . Hemorrhoids 11/11/2014  . Constipation 11/19/2013  . GERD (gastroesophageal reflux disease) 03/24/2013  . Irritable bowel syndrome with diarrhea 03/24/2013  . Herpes zoster 05/14/2005  . Abnormal Pap smear of cervix 05/15/1983  . History of cervical dysplasia 05/15/1983    Harrel Carina, MS, OTR/L 09/12/2020, 4:46 PM  [place] MAIN [facility] SERVICES [address]  [place], [place], [address] Phone: [phone]   Fax:  [phone]  Name: Erika Stanton MRN: [id number] Date of Birth: 15-Sep-1955

## 2020-09-14 ENCOUNTER — Other Ambulatory Visit: Payer: Self-pay

## 2020-09-14 ENCOUNTER — Encounter: Payer: Self-pay | Admitting: Occupational Therapy

## 2020-09-14 ENCOUNTER — Ambulatory Visit: Payer: BC Managed Care – PPO | Admitting: Occupational Therapy

## 2020-09-14 DIAGNOSIS — R278 Other lack of coordination: Secondary | ICD-10-CM

## 2020-09-14 DIAGNOSIS — M6281 Muscle weakness (generalized): Secondary | ICD-10-CM | POA: Diagnosis not present

## 2020-09-14 NOTE — Therapy (Signed)
[place] MAIN [facility] SERVICES [address] [place], [place], [address] Phone: [phone]   Fax:  [phone]  Occupational Therapy Treatment  Patient Details  Name: Erika Stanton MRN: [id number] Date of Birth: [birth year] Referring Provider (OT): Dr. Naaman Plummer   Encounter Date: 09/14/2020   OT End of Session - 09/14/20 1458    Visit Number 34    Number of Visits 60    Date for OT Re-Evaluation 12/05/20    Authorization Type Progress report period starting 08/08/2020    OT Start Time 1145    OT Stop Time 1230    OT Time Calculation (min) 45 min    Activity Tolerance Patient tolerated treatment well    Behavior During Therapy [facility] for tasks assessed/performed           Past Medical History:  Diagnosis Date  . Aneurysm ([place])   . Melena     Past Surgical History:  Procedure Laterality Date  . CHOLECYSTECTOMY    . COLONOSCOPY  11/2017   at [facility]. no recurrent polyps.  suggest repeat surveillance study 11/2022.    Marland Kitchen COLONOSCOPY W/ POLYPECTOMY  06/2014   Dr Roxy Manns at [facility].  3 adenomatoous polyps, anal fissure.    . ESOPHAGOGASTRODUODENOSCOPY (EGD) WITH PROPOFOL N/A 01/27/2020   Procedure: ESOPHAGOGASTRODUODENOSCOPY (EGD) WITH PROPOFOL;  Surgeon: Mauri Pole, MD;  Location: [facility];  Service: Endoscopy;  Laterality: N/A;  . IR 3D INDEPENDENT WKST  01/06/2020  . IR ANGIO INTRA EXTRACRAN SEL INTERNAL CAROTID BILAT MOD SED  01/06/2020  . IR ANGIO VERTEBRAL SEL VERTEBRAL UNI L MOD SED  01/06/2020  . IR ANGIOGRAM FOLLOW UP STUDY  01/06/2020  . IR ANGIOGRAM FOLLOW UP STUDY  01/06/2020  . IR ANGIOGRAM FOLLOW UP STUDY  01/06/2020  . IR ANGIOGRAM FOLLOW UP STUDY  01/06/2020  . IR ANGIOGRAM FOLLOW UP STUDY  01/06/2020  . IR ANGIOGRAM FOLLOW UP STUDY  01/06/2020  . IR ANGIOGRAM FOLLOW UP STUDY  01/06/2020  . IR ANGIOGRAM FOLLOW UP STUDY  01/06/2020  . IR ANGIOGRAM FOLLOW UP STUDY  01/06/2020  . IR ANGIOGRAM FOLLOW UP STUDY  01/06/2020  . IR NEURO  EACH ADD'L AFTER BASIC UNI RIGHT (MS)  01/06/2020  . IR TRANSCATH/EMBOLIZ  01/06/2020  . RADIOLOGY WITH ANESTHESIA N/A 01/06/2020   Procedure: IR WITH ANESTHESIA FOR ANEURYSM;  Surgeon: Consuella Lose, MD;  Location: [facility];  Service: Radiology;  Laterality: N/A;  . TUBAL LIGATION      There were no vitals filed for this visit.   Subjective Assessment - 09/14/20 1457    Subjective  Pt. reports that the soreness is improving.    Patient is accompanied by: Family member    Pertinent History ICH 01/06/20, Aneurysm. PMH: OA bilateral knees and hands    Repetition Decreases Symptoms    Currently in Pain? Yes          OT TREATMENT   Therapeutic Exercise:  Pt. was able to tolerate AAROM/PROMfor shoulder flexion, abduction,elbow flexion, elbow extension,AAROM supinationPROM for right wrist, digit MP, PIP, and DIP flexion, and extension, AROM digit MP, PIP, and DIP flexion,extension.Thumb radial, and palmar abduction, Thumb IP flexion,and extensionin preparation for grasping objects.Education was provided aboutcontinuing to seek outopportunities toengage her hand more at home.  Neuromuscular re-ed:  Pt. worked ongrasping1/2" flat marbles from a flat tabletop surface with a resistive mat underneath. Pt utilized her 2nd digit, and thumb when grasping. Emphasis was placed on extending, and opening the digits  on her right hand.  Pt. plans to have Botox injections over the next couple of weeks. Pt. continues to tolerate moist heat prior to, and in conjunction with ROM. Pt. continues to engage her right UE during tasksat home, andcontinues to do homeexercises with her husbandusing a bar, as well as grasp/releasing tennis balls.Pt. presented with increased thumb ROM with no reports of pain or discomfort with ROM.Pt. continues to present with  Improving active digit grasp, and release with her right thumb, 2nd, and 3rd digits. Pt.continues to work on improving RUE ROM, and  functional reaching in preparation for hand to face patterns for self-care tasks.Pt. Continues to work on improving RUE ROM, and functional hand use during ADLs, and IADL tasks.                          OT Education - 09/14/20 1458    Education Details RUE functioning, POC and goals.    Person(s) Educated Patient    Methods Explanation;Demonstration;Verbal cues;Handout    Comprehension Verbalized understanding;Returned demonstration;Verbal cues required            OT Short Term Goals - 09/12/20 1621      OT SHORT TERM GOAL #4   Title Pt will demonstrate 60* A/ROM shoulder flexion for RUE with pain less than or equal to 4/10.    Baseline Pt. presents with limited isolated  right shoulder flexion with pain continues to be 2. 08/08/20: 40* AROM with 3/10 pain    Time 4    Status Deferred      OT SHORT TERM GOAL #5   Title Pt will demonstrate improved LUE fine motor coordination for ADLs as evidenced by decreasing 9 hole peg test score to 25 secs or less.    Baseline Left FMC 41 sec visa the 9 hole peg test. 08/08/20: 32 seconds via 9 hole peg    Time 6    Period Weeks    Status On-going    Target Date 12/05/20      OT SHORT TERM GOAL #7   Title Pt will demonstrate at least 25% finger flexion/ extension in RUE in prep for functional use.    Baseline Limited PROM, and active digit flexion, and extension. Improved AROM for right 2nd, and 3rd digits. significant limitations with the 4th, and 5th digits. 08/08/20: AROM digit MCP 2nd (80*), 3rd (85*), 4th (80*), 5th (90*)    Time 4    Period Weeks    Status Deferred             OT Long Term Goals - 09/12/20 1625      OT LONG TERM GOAL #1   Title Pt will perform all basic ADLs with supervision    Baseline 09/12/2020: Pt. requires minA from her husband following multiple recent surgeries. Pt. requires minA from her husband. 08/08/20: Pt continues to require MIN A for grooming and dressing tasks.    Time 12     Period Weeks    Target Date 12/05/20      OT LONG TERM GOAL #2   Baseline 09/12/2020: Pt. contitnues to present with limited AROM for isolated  right shoulder flexion with pain 2 . 08/08/20: R AROM Shoulder flexion - 45* c 3/10 pain.    Time 12    Period Weeks    Status Revised    Target Date 12/05/20      OT LONG TERM GOAL #3   Title Pt will use  RUE as a stabilizer/ gross A at least 30 % of the time for ADLs/ IADLs.    Baseline Pt. is using her right hand as a gross stabilizer 15% of the time. 08/08/20: requires MAX cues    Time 12    Period Weeks    Status On-going    Target Date 12/05/20      OT LONG TERM GOAL #4   Title Pt will increase LUE grip strength to 25 lbs or greater for increased ease with ADLs.    Baseline 09/12/2020:Pt. conitnues to present with limited left grip. Pt. has improved with left grip strength. Pt.continues to work towards 25#. 08/08/20: L grip 19#    Time 12    Period Weeks    Status On-going    Target Date 12/05/20      OT LONG TERM GOAL #5   Title Pt will perform simple home management/ snack prep with min A.    Baseline 09/12/2020: Pt. assists with filling the dishwasher, and performing laundry. Pt. continues to try to help when she can, however her husband mostly performs these tasks. 08/08/20: Husband performs    Time 12    Period Weeks    Status On-going    Target Date 12/05/20      OT LONG TERM GOAL #6   Title Pt will demonstrate ability to grasp/ release a cup 2/3 trials with RUE.    Baseline 09/12/2020: Pt. continues to initiate grasp, and active release of objects, however is unable to grasp/release a cup. 08/08/20: Able to grasp, unable to release x1 trial    Time 12    Period Weeks    Status On-going    Target Date 12/05/20      OT LONG TERM GOAL #8   Title I with updated HEP.    Baseline 09/12/2020: Pt. conitinues to require assist for her HEP. Pt. requires assist, and cuing for HEP. 08/08/20: reports completing with husband assist for  technique    Time 12    Period Weeks    Target Date 09/12/20      OT LONG TERM GOAL  #9   TITLE Pt. will use her right hand to assist the left hand in searching for items in, and holding her purse.    Baseline 09/12/2020: Pt requires MIN cues to continually use RUE to hold purse.    Time 12    Period Weeks    Status On-going    Target Date 12/05/20      OT LONG TERM GOAL  #10   TITLE Pt. will use her right hand to assist the left to independently place eye glasses into a case.    Baseline Pt. has difficulty performing. 08/08/20: cues to incorporate RUE    Time 12    Period Weeks    Status Partially Met    Target Date 12/05/20                 Plan - 09/14/20 1459    Clinical Impression Statement Pt. plans to have Botox over the next couple of weeks. Pt. continues to tolerate moist heat prior to, and in conjunction with ROM. Pt. continues to engage her right UE during tasksat home, andcontinues to do homeexercises with her husbandusing a bar, as well as grasp/releasing tennis balls.Pt. presented with increased thumb ROM with no reports of pain or discomfort with ROM.Pt. continues to present with  Improving active digit grasp, and release with her right thumb, 2nd, and 3rd  digits. Pt.continues to work on improving RUE ROM, and functional reaching in preparation for hand to face patterns for self-care tasks.Pt. Continues to work on improving RUE ROM, and functional hand use during ADLs, and IADL tasks.   OT Occupational Profile and History Detailed Assessment- Review of Records and additional review of physical, cognitive, psychosocial history related to current functional performance    Occupational performance deficits (Please refer to evaluation for details): ADL's;IADL's;Rest and Sleep;Work;Play;Leisure    Body Structure / Function / Physical Skills ADL;Balance;Coordination;Decreased knowledge of precautions;Decreased knowledge of use of  DME;Dexterity;Edema;Mobility;Tone;Strength;IADL;Sensation;GMC;Gait;ROM;FMC;Flexibility;Pain;Vision;UE functional use;Endurance    Cognitive Skills Attention;Perception;Problem Solve;Safety Awareness;Thought    Rehab Potential Good    Clinical Decision Making Several treatment options, min-mod task modification necessary    Comorbidities Affecting Occupational Performance: None    Modification or Assistance to Complete Evaluation  Min-Moderate modification of tasks or assist with assess necessary to complete eval    OT Frequency 3x / week    OT Duration 12 weeks    OT Treatment/Interventions Self-care/ADL training;Ultrasound;Energy conservation;Visual/perceptual remediation/compensation;Patient/family education;DME and/or AE instruction;Aquatic Therapy;Paraffin;Gait Training;Passive range of motion;Balance training;Fluidtherapy;Electrical Stimulation;Functional Mobility Training;Splinting;Moist Heat;Therapeutic exercise;Manual Therapy;Cognitive remediation/compensation;Manual lymph drainage;Neuromuscular education;Coping strategies training    Plan Pt is transferring her care to [facility] as it is closer to home. She remains very fearful of moving RUE and responds well to heat, and estim.    Consulted and Agree with Plan of Care Patient    Family Member Consulted Spose present during the session.           Patient will benefit from skilled therapeutic intervention in order to improve the following deficits and impairments:   Body Structure / Function / Physical Skills: ADL,Balance,Coordination,Decreased knowledge of precautions,Decreased knowledge of use of DME,Dexterity,Edema,Mobility,Tone,Strength,IADL,Sensation,GMC,Gait,ROM,FMC,Flexibility,Pain,Vision,UE functional use,Endurance Cognitive Skills: Attention,Perception,Problem Solve,Safety Awareness,Thought     Visit Diagnosis: Muscle weakness (generalized)  Other lack of coordination    Problem List Patient Active Problem List    Diagnosis Date Noted  . Dyspareunia in female 04/05/2020  . History of abnormal cervical Pap smear 03/10/2020  . GIB (gastrointestinal bleeding) 02/28/2020  . Elevated BUN   . Prediabetes   . 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 Cerebral vasospasm   . Sinus tachycardia   . Dysphagia, post-stroke   . Thrombocytopenia ([place])   . Acute blood loss anemia   . Brain aneurysm   . ICH (intracerebral hemorrhage) ([place]) 01/05/2020  . History of adenomatous polyp of colon 02/07/2016  . 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 Fissure in ano 11/11/2014  . Hemorrhoids 11/11/2014  . Constipation 11/19/2013  . GERD (gastroesophageal reflux disease) 03/24/2013  . Irritable bowel syndrome with diarrhea 03/24/2013  . Herpes zoster 05/14/2005  . Abnormal Pap smear of cervix 05/15/1983  . History of cervical dysplasia 05/15/1983    Harrel Carina, MS, OTR/L 09/14/2020, 3:02 PM  [place] MAIN [facility] SERVICES [address] [place], [place], [address] Phone: [phone]   Fax:  [phone]  Name: Erika Stanton MRN: [id number] Date of Birth: 1956/02/15

## 2020-09-15 ENCOUNTER — Ambulatory Visit: Payer: BC Managed Care – PPO | Admitting: Occupational Therapy

## 2020-09-19 ENCOUNTER — Other Ambulatory Visit: Payer: Self-pay

## 2020-09-19 ENCOUNTER — Encounter: Payer: Self-pay | Admitting: Occupational Therapy

## 2020-09-19 ENCOUNTER — Ambulatory Visit: Payer: BC Managed Care – PPO | Admitting: Occupational Therapy

## 2020-09-19 DIAGNOSIS — M6281 Muscle weakness (generalized): Secondary | ICD-10-CM | POA: Diagnosis not present

## 2020-09-19 DIAGNOSIS — R278 Other lack of coordination: Secondary | ICD-10-CM

## 2020-09-19 NOTE — Therapy (Signed)
[place] MAIN [facility] SERVICES [address] [place], [place], [address] Phone: [phone]   Fax:  [phone]  Occupational Therapy Treatment  Patient Details  Name: Erika Stanton MRN: [id number] Date of Birth: [birth year] Referring Provider (OT): Dr. Naaman Plummer   Encounter Date: 09/19/2020   OT End of Session - 09/19/20 1312    Visit Number [id number]    Number of Visits 60    Date for OT Re-Evaluation 12/05/20    Authorization Type Progress report period starting 08/08/2020    OT Start Time 1300    OT Stop Time 1345    OT Time Calculation (min) 45 min    Activity Tolerance Patient tolerated treatment well    Behavior During Therapy [facility] for tasks assessed/performed           Past Medical History:  Diagnosis Date  . Aneurysm ([place])   . Melena     Past Surgical History:  Procedure Laterality Date  . CHOLECYSTECTOMY    . COLONOSCOPY  11/2017   at [facility]. no recurrent polyps.  suggest repeat surveillance study 11/2022.    Marland Kitchen COLONOSCOPY W/ POLYPECTOMY  06/2014   Dr Roxy Manns at [facility].  3 adenomatoous polyps, anal fissure.    . ESOPHAGOGASTRODUODENOSCOPY (EGD) WITH PROPOFOL N/A 01/27/2020   Procedure: ESOPHAGOGASTRODUODENOSCOPY (EGD) WITH PROPOFOL;  Surgeon: Mauri Pole, MD;  Location: [facility];  Service: Endoscopy;  Laterality: N/A;  . IR 3D INDEPENDENT WKST  01/06/2020  . IR ANGIO INTRA EXTRACRAN SEL INTERNAL CAROTID BILAT MOD SED  01/06/2020  . IR ANGIO VERTEBRAL SEL VERTEBRAL UNI L MOD SED  01/06/2020  . IR ANGIOGRAM FOLLOW UP STUDY  01/06/2020  . IR ANGIOGRAM FOLLOW UP STUDY  01/06/2020  . IR ANGIOGRAM FOLLOW UP STUDY  01/06/2020  . IR ANGIOGRAM FOLLOW UP STUDY  01/06/2020  . IR ANGIOGRAM FOLLOW UP STUDY  01/06/2020  . IR ANGIOGRAM FOLLOW UP STUDY  01/06/2020  . IR ANGIOGRAM FOLLOW UP STUDY  01/06/2020  . IR ANGIOGRAM FOLLOW UP STUDY  01/06/2020  . IR ANGIOGRAM FOLLOW UP STUDY  01/06/2020  . IR ANGIOGRAM FOLLOW UP STUDY  01/06/2020  . IR NEURO  EACH ADD'L AFTER BASIC UNI RIGHT (MS)  01/06/2020  . IR TRANSCATH/EMBOLIZ  01/06/2020  . RADIOLOGY WITH ANESTHESIA N/A 01/06/2020   Procedure: IR WITH ANESTHESIA FOR ANEURYSM;  Surgeon: Consuella Lose, MD;  Location: [facility];  Service: Radiology;  Laterality: N/A;  . TUBAL LIGATION      There were no vitals filed for this visit.   Subjective Assessment - 09/19/20 1755    Subjective  Pt. reports that she has to clean out stuff in her home, as [place]    Patient is accompanied by: Family member    Pain Score 2     Pain Location Hand    Pain Orientation Right    Pain Descriptors / Indicators Aching    Pain Type Chronic pain    Pain Onset More than a month ago          OT TREATMENT   Therapeutic Exercise:  Pt. was able to tolerate AAROM/PROMfor shoulder flexion, abduction,elbow flexion, elbow extension,AAROM supinationPROM for right wrist, digit MP, PIP, and DIP flexion, and extension, AROM digit MP, PIP, and DIP flexion,extension.Thumb radial, and palmar abduction, Thumb IP flexion,and extensionin preparation for grasping objects.Education was provided aboutcontinuing to seek outopportunities toengage her hand more at home.  Neuromuscular re-ed:  Pt. worked  ongrasping1/2" flat marbles from a flat tabletop surface with a resistive mat underneath. Pt utilized her 2nd digit, and thumb when grasping. Emphasis was placed on extending, and opening the digits on her right hand.  Pt. continues to tolerate moist heat prior to, and in conjunction with ROM.Pt. presented with increased thumb ROM with no reports of pain or discomfort with ROM.Pt. continues to present with Improving active digit grasp,and release with her right thumb, 2nd, and 3rd digits.Pt.continues to work on improving RUE ROM, and functional reaching in preparation for hand to face patterns for self-care tasks.Pt. Continues to work on improving RUE ROM, and functional hand  use during ADLs, and IADL tasks.                       OT Education - 09/19/20 1311    Education Details RUE functioning, POC and goals.    Person(s) Educated Patient    Methods Explanation;Demonstration;Verbal cues;Handout    Comprehension Verbalized understanding;Returned demonstration;Verbal cues required            OT Short Term Goals - 09/12/20 1621      OT SHORT TERM GOAL #4   Title Pt will demonstrate 60* A/ROM shoulder flexion for RUE with pain less than or equal to 4/10.    Baseline Pt. presents with limited isolated  right shoulder flexion with pain continues to be 2. 08/08/20: 40* AROM with 3/10 pain    Time 4    Status Deferred      OT SHORT TERM GOAL #5   Title Pt will demonstrate improved LUE fine motor coordination for ADLs as evidenced by decreasing 9 hole peg test score to 25 secs or less.    Baseline Left FMC 41 sec visa the 9 hole peg test. 08/08/20: 32 seconds via 9 hole peg    Time 6    Period Weeks    Status On-going    Target Date 12/05/20      OT SHORT TERM GOAL #7   Title Pt will demonstrate at least 25% finger flexion/ extension in RUE in prep for functional use.    Baseline Limited PROM, and active digit flexion, and extension. Improved AROM for right 2nd, and 3rd digits. significant limitations with the 4th, and 5th digits. 08/08/20: AROM digit MCP 2nd (80*), 3rd (85*), 4th (80*), 5th (90*)    Time 4    Period Weeks    Status Deferred             OT Long Term Goals - 09/12/20 1625      OT LONG TERM GOAL #1   Title Pt will perform all basic ADLs with supervision    Baseline 09/12/2020: Pt. requires minA from her husband following multiple recent surgeries. Pt. requires minA from her husband. 08/08/20: Pt continues to require MIN A for grooming and dressing tasks.    Time 12    Period Weeks    Target Date 12/05/20      OT LONG TERM GOAL #2   Baseline 09/12/2020: Pt. contitnues to present with limited AROM for isolated  right  shoulder flexion with pain 2 . 08/08/20: R AROM Shoulder flexion - 45* c 3/10 pain.    Time 12    Period Weeks    Status Revised    Target Date 12/05/20      OT LONG TERM GOAL #3   Title Pt will use RUE as a stabilizer/ gross A at least 30 % of the  time for ADLs/ IADLs.    Baseline Pt. is using her right hand as a gross stabilizer 15% of the time. 08/08/20: requires MAX cues    Time 12    Period Weeks    Status On-going    Target Date 12/05/20      OT LONG TERM GOAL #4   Title Pt will increase LUE grip strength to 25 lbs or greater for increased ease with ADLs.    Baseline 09/12/2020:Pt. conitnues to present with limited left grip. Pt. has improved with left grip strength. Pt.continues to work towards 25#. 08/08/20: L grip 19#    Time 12    Period Weeks    Status On-going    Target Date 12/05/20      OT LONG TERM GOAL #5   Title Pt will perform simple home management/ snack prep with min A.    Baseline 09/12/2020: Pt. assists with filling the dishwasher, and performing laundry. Pt. continues to try to help when she can, however her husband mostly performs these tasks. 08/08/20: Husband performs    Time 12    Period Weeks    Status On-going    Target Date 12/05/20      OT LONG TERM GOAL #6   Title Pt will demonstrate ability to grasp/ release a cup 2/3 trials with RUE.    Baseline 09/12/2020: Pt. continues to initiate grasp, and active release of objects, however is unable to grasp/release a cup. 08/08/20: Able to grasp, unable to release x1 trial    Time 12    Period Weeks    Status On-going    Target Date 12/05/20      OT LONG TERM GOAL #8   Title I with updated HEP.    Baseline 09/12/2020: Pt. conitinues to require assist for her HEP. Pt. requires assist, and cuing for HEP. 08/08/20: reports completing with husband assist for technique    Time 12    Period Weeks    Target Date 09/12/20      OT LONG TERM GOAL  #9   TITLE Pt. will use her right hand to assist the left hand in  searching for items in, and holding her purse.    Baseline 09/12/2020: Pt requires MIN cues to continually use RUE to hold purse.    Time 12    Period Weeks    Status On-going    Target Date 12/05/20      OT LONG TERM GOAL  #10   TITLE Pt. will use her right hand to assist the left to independently place eye glasses into a case.    Baseline Pt. has difficulty performing. 08/08/20: cues to incorporate RUE    Time 12    Period Weeks    Status Partially Met    Target Date 12/05/20                 Plan - 09/19/20 1313    Clinical Impression Statement Pt. continues to tolerate moist heat prior to, and in conjunction with ROM.Pt. presented with increased thumb ROM with no reports of pain or discomfort with ROM.Pt. continues to present with Improving active digit grasp,and release with her right thumb, 2nd, and 3rd digits.Pt.continues to work on improving RUE ROM, and functional reaching in preparation for hand to face patterns for self-care tasks.Pt. Continues to work on improving RUE ROM, and functional hand use during ADLs, and IADL tasks.   OT Occupational Profile and History Detailed Assessment- Review of Records and additional  review of physical, cognitive, psychosocial history related to current functional performance    Occupational performance deficits (Please refer to evaluation for details): ADL's;IADL's;Rest and Sleep;Work;Play;Leisure    Body Structure / Function / Physical Skills ADL;Balance;Coordination;Decreased knowledge of precautions;Decreased knowledge of use of DME;Dexterity;Edema;Mobility;Tone;Strength;IADL;Sensation;GMC;Gait;ROM;FMC;Flexibility;Pain;Vision;UE functional use;Endurance    Cognitive Skills Attention;Perception;Problem Solve;Safety Awareness;Thought    Rehab Potential Good    Clinical Decision Making Several treatment options, min-mod task modification necessary    Comorbidities Affecting Occupational Performance: None    Modification or Assistance  to Complete Evaluation  Min-Moderate modification of tasks or assist with assess necessary to complete eval    OT Frequency 3x / week    OT Duration 12 weeks    OT Treatment/Interventions Self-care/ADL training;Ultrasound;Energy conservation;Visual/perceptual remediation/compensation;Patient/family education;DME and/or AE instruction;Aquatic Therapy;Paraffin;Gait Training;Passive range of motion;Balance training;Fluidtherapy;Electrical Stimulation;Functional Mobility Training;Splinting;Moist Heat;Therapeutic exercise;Manual Therapy;Cognitive remediation/compensation;Manual lymph drainage;Neuromuscular education;Coping strategies training    Consulted and Agree with Plan of Care Patient           Patient will benefit from skilled therapeutic intervention in order to improve the following deficits and impairments:   Body Structure / Function / Physical Skills: ADL,Balance,Coordination,Decreased knowledge of precautions,Decreased knowledge of use of DME,Dexterity,Edema,Mobility,Tone,Strength,IADL,Sensation,GMC,Gait,ROM,FMC,Flexibility,Pain,Vision,UE functional use,Endurance Cognitive Skills: Attention,Perception,Problem Solve,Safety Awareness,Thought     Visit Diagnosis: Muscle weakness (generalized)  Other lack of coordination    Problem List Patient Active Problem List   Diagnosis Date Noted  . Dyspareunia in female 04/05/2020  . History of abnormal cervical Pap smear 03/10/2020  . GIB (gastrointestinal bleeding) 02/28/2020  . Elevated BUN   . Prediabetes   . 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 Cerebral vasospasm   . Sinus tachycardia   . Dysphagia, post-stroke   . Thrombocytopenia ([place])   . Acute blood loss anemia   . Brain aneurysm   . ICH (intracerebral hemorrhage) ([place]) 01/05/2020  . History of adenomatous polyp of colon 02/07/2016  . 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 Fissure in ano 11/11/2014  . Hemorrhoids 11/11/2014  . Constipation 11/19/2013   . GERD (gastroesophageal reflux disease) 03/24/2013  . Irritable bowel syndrome with diarrhea 03/24/2013  . Herpes zoster 05/14/2005  . Abnormal Pap smear of cervix 05/15/1983  . History of cervical dysplasia 05/15/1983    Harrel Carina, MS, OTR/L 09/19/2020, 5:59 PM  [place] MAIN [facility] SERVICES [address] [place], [place], [address] Phone: [phone]   Fax:  [phone]  Name: Erika Stanton MRN: [id number] Date of Birth: 12/14/1955

## 2020-09-21 ENCOUNTER — Encounter: Payer: Self-pay | Admitting: Occupational Therapy

## 2020-09-21 ENCOUNTER — Ambulatory Visit: Payer: BC Managed Care – PPO | Admitting: Occupational Therapy

## 2020-09-21 ENCOUNTER — Other Ambulatory Visit: Payer: Self-pay

## 2020-09-21 DIAGNOSIS — M6281 Muscle weakness (generalized): Secondary | ICD-10-CM

## 2020-09-21 NOTE — Therapy (Signed)
[place] MAIN [facility] SERVICES [address] [place], [place], [address] Phone: [phone]   Fax:  [phone]  Occupational Therapy Progress Note  Dates of reporting period  08/08/2020   to   09/21/2020  Patient Details  Name: Erika Stanton MRN: [id number] Date of Birth: [birth year] Referring Provider (OT): Dr. Naaman Plummer   Encounter Date: 09/21/2020   OT End of Session - 09/21/20 1159    Visit Number 60    Number of Visits 60    Date for OT Re-Evaluation 12/05/20    Authorization Type Progress report period starting 09/21/2020    Authorization Time Period State [employer]    OT Start Time 1149    OT Stop Time 1230    OT Time Calculation (min) 41 min    Activity Tolerance Patient tolerated treatment well    Behavior During Therapy [facility] for tasks assessed/performed           Past Medical History:  Diagnosis Date  . Aneurysm ([place])   . Melena     Past Surgical History:  Procedure Laterality Date  . CHOLECYSTECTOMY    . COLONOSCOPY  11/2017   at [facility]. no recurrent polyps.  suggest repeat surveillance study 11/2022.    Marland Kitchen COLONOSCOPY W/ POLYPECTOMY  06/2014   Dr Roxy Manns at [facility].  3 adenomatoous polyps, anal fissure.    . ESOPHAGOGASTRODUODENOSCOPY (EGD) WITH PROPOFOL N/A 01/27/2020   Procedure: ESOPHAGOGASTRODUODENOSCOPY (EGD) WITH PROPOFOL;  Surgeon: Mauri Pole, MD;  Location: [place] [facility];  Service: Endoscopy;  Laterality: N/A;  . IR 3D INDEPENDENT WKST  01/06/2020  . IR ANGIO INTRA EXTRACRAN SEL INTERNAL CAROTID BILAT MOD SED  01/06/2020  . IR ANGIO VERTEBRAL SEL VERTEBRAL UNI L MOD SED  01/06/2020  . IR ANGIOGRAM FOLLOW UP STUDY  01/06/2020  . IR ANGIOGRAM FOLLOW UP STUDY  01/06/2020  . IR ANGIOGRAM FOLLOW UP STUDY  01/06/2020  . IR ANGIOGRAM FOLLOW UP STUDY  01/06/2020  . IR ANGIOGRAM FOLLOW UP STUDY  01/06/2020  . IR ANGIOGRAM FOLLOW UP STUDY  01/06/2020  . IR ANGIOGRAM FOLLOW UP STUDY  01/06/2020  . IR ANGIOGRAM FOLLOW UP STUDY   01/06/2020  . IR ANGIOGRAM FOLLOW UP STUDY  01/06/2020  . IR ANGIOGRAM FOLLOW UP STUDY  01/06/2020  . IR NEURO EACH ADD'L AFTER BASIC UNI RIGHT (MS)  01/06/2020  . IR TRANSCATH/EMBOLIZ  01/06/2020  . RADIOLOGY WITH ANESTHESIA N/A 01/06/2020   Procedure: IR WITH ANESTHESIA FOR ANEURYSM;  Surgeon: Consuella Lose, MD;  Location: [place];  Service: Radiology;  Laterality: N/A;  . TUBAL LIGATION      There were no vitals filed for this visit.   Subjective Assessment - 09/21/20 1154    Subjective  Pt. reports that she is doing well today.    Patient is accompanied by: Family member    Pertinent History ICH 01/06/20, Aneurysm. PMH: OA bilateral knees and hands    Currently in Pain? Yes    Pain Score 2     Pain Location Arm    Pain Orientation Right    Pain Descriptors / Indicators Sore    Pain Type Chronic pain    Pain Onset More than a month ago    Pain Score 2    Pain Location Breast    Pain Orientation Left           OT TREATMENT   Therapeutic Exercise:  Pt. was able to tolerate AAROM/PROMfor shoulder flexion, abduction,elbow flexion, elbow extension,AAROM  supinationPROM for right wrist, digit MP, PIP, and DIP flexion, and extension, AROM digit MP, PIP, and DIP flexion,extension.Thumb radial, and palmar abduction, Thumb IP flexion,and extensionin preparation for grasping objects.Education was provided aboutcontinuing to seek outopportunities toengage her hand more at home.  Neuromuscular re-ed:  Pt. worked ongrasping 1.5" pegs. Pt utilized her 2nd digit, and thumb when grasping. Emphasis was placed on extending her wrist, and opening the digits on her right hand when picking up the objects, as well as when placing them back onto the pegboard.  Pt. Reports that she now has to meet with the radiation oncologist next week, and prepare for radiation treatments if needed. Pt. Continues to progress towards her goals, and is now trying to engage her right hand in more tasks  at home. Pt.continues to toleratemoist heat prior to, and in conjunction with ROM.Pt. presented with increased thumb ROM with no reports of pain or discomfort with ROM.Pt. continues to present with Improving active digit grasp,and release with her right thumb, 2nd, and 3rd digits.Pt.continues to work on improving RUE ROM, and functional reaching in preparation for hand to face patterns for self-care tasks.Pt. Continues to work on improving RUE ROM, and functional hand use during ADLs, and IADLtasks.                        OT Education - 09/21/20 1159    Education Details RUE functioning    Person(s) Educated Patient    Methods Explanation;Demonstration;Verbal cues;Handout    Comprehension Verbalized understanding;Returned demonstration;Verbal cues required            OT Short Term Goals - 09/12/20 1621      OT SHORT TERM GOAL #4   Title Pt will demonstrate 60* A/ROM shoulder flexion for RUE with pain less than or equal to 4/10.    Baseline Pt. presents with limited isolated  right shoulder flexion with pain continues to be 2. 08/08/20: 40* AROM with 3/10 pain    Time 4    Status Deferred      OT SHORT TERM GOAL #5   Title Pt will demonstrate improved LUE fine motor coordination for ADLs as evidenced by decreasing 9 hole peg test score to 25 secs or less.    Baseline Left FMC 41 sec visa the 9 hole peg test. 08/08/20: 32 seconds via 9 hole peg    Time 6    Period Weeks    Status On-going    Target Date 12/05/20      OT SHORT TERM GOAL #7   Title Pt will demonstrate at least 25% finger flexion/ extension in RUE in prep for functional use.    Baseline Limited PROM, and active digit flexion, and extension. Improved AROM for right 2nd, and 3rd digits. significant limitations with the 4th, and 5th digits. 08/08/20: AROM digit MCP 2nd (80*), 3rd (85*), 4th (80*), 5th (90*)    Time 4    Period Weeks    Status Deferred             OT Long Term Goals -  09/21/20 1423      OT LONG TERM GOAL #1   Title Pt will perform all basic ADLs with supervision    Baseline Pt. requires minA from her husband following multiple recent surgeries. Pt. requires minA from her husband. 08/08/20: Pt continues to require MIN A for grooming and dressing tasks.    Time 12    Period Weeks    Status  On-going    Target Date 12/05/20      OT LONG TERM GOAL #2   Title Pt will demonstrate 60* RUE shoulder flexion in prep for functional reach with pain less than or equal to 3/10.    Baseline Pt. continues to present with limited AROM for isolated  right shoulder flexion with pain 2 . 08/08/20: R AROM Shoulder flexion - 45* c 3/10 pain.    Time 12    Period Weeks    Status On-going    Target Date 12/05/20      OT LONG TERM GOAL #3   Title Pt will use RUE as a stabilizer/ gross A at least 30 % of the time for ADLs/ IADLs.    Baseline Pt. is using her right hand as a gross stabilizer 15% of the time. 08/08/20: requires MAX cues    Time 12    Period Weeks    Status On-going    Target Date 12/05/20      OT LONG TERM GOAL #4   Title Pt will increase LUE grip strength to 25 lbs or greater for increased ease with ADLs.    Baseline Pt. conitnues to present with limited left grip. Pt. has improved with left grip strength. Pt.continues to work towards 25#. 08/08/20: L grip 19#    Time 12    Period Weeks    Status On-going    Target Date 12/05/20      OT LONG TERM GOAL #5   Title Pt will perform simple home management/ snack prep with min A.    Baseline Pt. assists with filling the dishwasher, and performing laundry. Pt. continues to try to help when she can, however her husband mostly performs these tasks. 08/08/20: Husband performs    Time 12    Period Weeks    Status On-going    Target Date 12/05/20      OT LONG TERM GOAL #6   Title Pt will demonstrate ability to grasp/ release a cup 2/3 trials with RUE.    Baseline Pt. continues to initiate grasp, and active release  of objects, however is unable to grasp/release a cup. 08/08/20: Able to grasp, unable to release x1 trial    Time 12    Period Weeks    Status On-going    Target Date 12/05/20      OT LONG TERM GOAL #8   Title I with updated HEP.      OT LONG TERM GOAL  #9   TITLE Pt. will use her right hand to assist the left hand in searching for items in, and holding her purse.    Baseline Pt requires MIN cues to continually use RUE to hold purse.    Time 12    Period Weeks    Status On-going    Target Date 12/05/20      OT LONG TERM GOAL  #10   TITLE Pt. will use her right hand to assist the left to independently place eye glasses into a case.    Baseline Pt. has difficulty performing. 08/08/20: cues to incorporate RUE    Time 12    Period Weeks    Status Partially Met    Target Date 12/05/20                 Plan - 09/21/20 1200    Clinical Impression Statement Pt. Reports that she now has to meet with the radiation oncologist next week, and prepare for radiation treatments if  needed. Pt. Continues to progress towards her goals, and is now trying to engage her right hand in more tasks at home. Pt.continues to toleratemoist heat prior to, and in conjunction with ROM.Pt. presented with increased thumb ROM with no reports of pain or discomfort with ROM.Pt. continues to present with Improving active digit grasp,and release with her right thumb, 2nd, and 3rd digits.Pt.continues to work on improving RUE ROM, and functional reaching in preparation for hand to face patterns for self-care tasks.Pt. Continues to work on improving RUE ROM, and functional hand use during ADLs, and IADLtasks.   OT Occupational Profile and History Detailed Assessment- Review of Records and additional review of physical, cognitive, psychosocial history related to current functional performance    Occupational performance deficits (Please refer to evaluation for details): ADL's;IADL's;Rest and  Sleep;Work;Play;Leisure    Body Structure / Function / Physical Skills ADL;Balance;Coordination;Decreased knowledge of precautions;Decreased knowledge of use of DME;Dexterity;Edema;Mobility;Tone;Strength;IADL;Sensation;GMC;Gait;ROM;FMC;Flexibility;Pain;Vision;UE functional use;Endurance    Cognitive Skills Attention;Perception;Problem Solve;Safety Awareness;Thought    Rehab Potential Good    Clinical Decision Making Several treatment options, min-mod task modification necessary    Comorbidities Affecting Occupational Performance: None    Modification or Assistance to Complete Evaluation  Min-Moderate modification of tasks or assist with assess necessary to complete eval    OT Frequency 3x / week    OT Duration 12 weeks    OT Treatment/Interventions Self-care/ADL training;Ultrasound;Energy conservation;Visual/perceptual remediation/compensation;Patient/family education;DME and/or AE instruction;Aquatic Therapy;Paraffin;Gait Training;Passive range of motion;Balance training;Fluidtherapy;Electrical Stimulation;Functional Mobility Training;Splinting;Moist Heat;Therapeutic exercise;Manual Therapy;Cognitive remediation/compensation;Manual lymph drainage;Neuromuscular education;Coping strategies training    Consulted and Agree with Plan of Care Patient           Patient will benefit from skilled therapeutic intervention in order to improve the following deficits and impairments:   Body Structure / Function / Physical Skills: ADL,Balance,Coordination,Decreased knowledge of precautions,Decreased knowledge of use of DME,Dexterity,Edema,Mobility,Tone,Strength,IADL,Sensation,GMC,Gait,ROM,FMC,Flexibility,Pain,Vision,UE functional use,Endurance Cognitive Skills: Attention,Perception,Problem Solve,Safety Awareness,Thought     Visit Diagnosis: Muscle weakness (generalized)    Problem List Patient Active Problem List   Diagnosis Date Noted  . Dyspareunia in female 04/05/2020  . History of abnormal  cervical Pap smear 03/10/2020  . GIB (gastrointestinal bleeding) 02/28/2020  . Elevated BUN   . Prediabetes   . 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 Cerebral vasospasm   . Sinus tachycardia   . Dysphagia, post-stroke   . Thrombocytopenia ([place])   . Acute blood loss anemia   . Brain aneurysm   . ICH (intracerebral hemorrhage) ([place]) 01/05/2020  . History of adenomatous polyp of colon 02/07/2016  . 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 Fissure in ano 11/11/2014  . Hemorrhoids 11/11/2014  . Constipation 11/19/2013  . GERD (gastroesophageal reflux disease) 03/24/2013  . Irritable bowel syndrome with diarrhea 03/24/2013  . Herpes zoster 05/14/2005  . Abnormal Pap smear of cervix 05/15/1983  . History of cervical dysplasia 05/15/1983    Harrel Carina, MS, OTR/L 09/21/2020, 6:03 PM  [place] MAIN [facility] SERVICES [address] [place], [place], [address] Phone: [phone]   Fax:  [phone]  Name: KORAL THADEN MRN: [id number] Date of Birth: June 05, 1955

## 2020-09-23 ENCOUNTER — Ambulatory Visit: Payer: BC Managed Care – PPO | Admitting: Occupational Therapy

## 2020-09-26 ENCOUNTER — Ambulatory Visit: Payer: BC Managed Care – PPO | Admitting: Occupational Therapy

## 2020-09-27 ENCOUNTER — Encounter: Payer: BC Managed Care – PPO | Admitting: Physical Medicine and Rehabilitation

## 2020-09-28 ENCOUNTER — Ambulatory Visit: Payer: BC Managed Care – PPO | Admitting: Occupational Therapy

## 2020-09-29 ENCOUNTER — Other Ambulatory Visit: Payer: Self-pay

## 2020-09-29 ENCOUNTER — Encounter: Payer: Self-pay | Admitting: Occupational Therapy

## 2020-09-29 ENCOUNTER — Encounter: Payer: BC Managed Care – PPO | Admitting: Occupational Therapy

## 2020-09-29 ENCOUNTER — Ambulatory Visit: Payer: BC Managed Care – PPO | Admitting: Occupational Therapy

## 2020-09-29 DIAGNOSIS — M6281 Muscle weakness (generalized): Secondary | ICD-10-CM | POA: Diagnosis not present

## 2020-09-29 NOTE — Therapy (Signed)
[place] MAIN [facility] SERVICES [address] [place], [place], [address] Phone: [phone]   Fax:  [phone]  Occupational Therapy Treatment  Patient Details  Name: Erika Stanton MRN: [id number] Date of Birth: 12/16/1955 Referring Provider (OT): Dr. Naaman Plummer   Encounter Date: 09/29/2020   OT End of Session - 09/29/20 1444    Visit Number 59    Number of Visits 30    Date for OT Re-Evaluation 12/05/20    Authorization Type Progress report period starting 09/21/2020    Authorization Time Period State BC[employer]    OT Start Time 1435    OT Stop Time 1520    OT Time Calculation (min) 45 min    Activity Tolerance Patient tolerated treatment well    Behavior During Therapy [facility] for tasks assessed/performed           Past Medical History:  Diagnosis Date  . Aneurysm ([place])   . Melena     Past Surgical History:  Procedure Laterality Date  . CHOLECYSTECTOMY    . COLONOSCOPY  11/2017   at [facility]. no recurrent polyps.  suggest repeat surveillance study 11/2022.    Marland Kitchen COLONOSCOPY W/ POLYPECTOMY  06/2014   Dr Roxy Manns at [facility]  3 adenomatoous polyps, anal fissure.    . ESOPHAGOGASTRODUODENOSCOPY (EGD) WITH PROPOFOL N/A 01/27/2020   Procedure: ESOPHAGOGASTRODUODENOSCOPY (EGD) WITH PROPOFOL;  Surgeon: Mauri Pole, MD;  Location: [place] ENDOSCOPY;  Service: Endoscopy;  Laterality: N/A;  . IR 3D INDEPENDENT WKST  01/06/2020  . IR ANGIO INTRA EXTRACRAN SEL INTERNAL CAROTID BILAT MOD SED  01/06/2020  . IR ANGIO VERTEBRAL SEL VERTEBRAL UNI L MOD SED  01/06/2020  . IR ANGIOGRAM FOLLOW UP STUDY  01/06/2020  . IR ANGIOGRAM FOLLOW UP STUDY  01/06/2020  . IR ANGIOGRAM FOLLOW UP STUDY  01/06/2020  . IR ANGIOGRAM FOLLOW UP STUDY  01/06/2020  . IR ANGIOGRAM FOLLOW UP STUDY  01/06/2020  . IR ANGIOGRAM FOLLOW UP STUDY  01/06/2020  . IR ANGIOGRAM FOLLOW UP STUDY  01/06/2020  . IR ANGIOGRAM FOLLOW UP STUDY  01/06/2020  . IR ANGIOGRAM FOLLOW UP STUDY  01/06/2020  . IR  ANGIOGRAM FOLLOW UP STUDY  01/06/2020  . IR NEURO EACH ADD'L AFTER BASIC UNI RIGHT (MS)  01/06/2020  . IR TRANSCATH/EMBOLIZ  01/06/2020  . RADIOLOGY WITH ANESTHESIA N/A 01/06/2020   Procedure: IR WITH ANESTHESIA FOR ANEURYSM;  Surgeon: Consuella Lose, MD;  Location: [place];  Service: Radiology;  Laterality: N/A;  . TUBAL LIGATION      There were no vitals filed for this visit.   Subjective Assessment - 09/29/20 1441    Subjective  Pt. reports that she was unable receive Botox treatments due to her insurance company not approving it.    Patient is accompanied by: Family member    Pertinent History ICH 01/06/20, Aneurysm. PMH: OA bilateral knees and hands    Repetition Decreases Symptoms    Pain Score 2     Pain Location Shoulder    Pain Orientation Right    Pain Descriptors / Indicators Aching    Pain Type Chronic pain    Pain Onset More than a month ago    Pain Score 2    Pain Location Hand    Pain Orientation Right    Pain Descriptors / Indicators Sore          OT TREATMENT   Therapeutic Exercise:  Pt. was able to tolerate AAROM/PROMfor shoulder flexion, abduction,elbow flexion, elbow  extension,AAROM supinationPROM for right wrist, digit MP, PIP, and DIP flexion, and extension, AROM digit MP, PIP, and DIP flexion,extension.Thumb radial, and palmar abduction, Thumb IP flexion,and extensionin preparation for grasping objects. Pt. Worked on BUE strengthening, and reciprocal motion using the UBE while seated for 5 min. with no resistance. Constant monitoring was provided.  Neuromuscular re-ed:  Pt. worked [place].5" pegs from a tabletop surface, and out of the therapist's hand. Pt. Worked on placing them into a pegboard placed at lap level. Pt utilized her 2nd digit, and thumb when grasping. Emphasis was placed on extending, and opening the digits on her right hand.  Pt. Reports that her insurance company would not approve  Botox injections. Pt. reports that she  and her husband are planning to move to a 15 y.o., and older community. Pt. presented with  2/10 pain in her right shoulder, and handthumb. Slight increase in edema in the dorsal aspect of her forearm just distal to the wrist joint was noted. No reports of pain. Pt.continues to toleratemoist heat prior to, and in conjunction with ROM.Pt. presented with increased thumb ROM without increased reports in pain. Pt. Tolerated ROM well today.Pt. continues to present with Improving active digit grasp,and release with her right thumb, 2nd, and 3rd digits.Pt.continues to work on improving RUE ROM, and functional reaching in preparation for hand to face patterns for self-care tasks.Pt. Continues to work on improving RUE ROM, and functional hand use during ADLs, and IADLtasks.                           OT Education - 09/29/20 1444    Education Details RUE functioning    Person(s) Educated Patient    Methods Explanation;Demonstration;Verbal cues;Handout    Comprehension Verbalized understanding;Returned demonstration;Verbal cues required            OT Short Term Goals - 09/12/20 1621      OT SHORT TERM GOAL #4   Title Pt will demonstrate 60* A/ROM shoulder flexion for RUE with pain less than or equal to 4/10.    Baseline Pt. presents with limited isolated  right shoulder flexion with pain continues to be 2. 08/08/20: 40* AROM with 3/10 pain    Time 4    Status Deferred      OT SHORT TERM GOAL #5   Title Pt will demonstrate improved LUE fine motor coordination for ADLs as evidenced by decreasing 9 hole peg test score to 25 secs or less.    Baseline Left FMC 41 sec visa the 9 hole peg test. 08/08/20: 32 seconds via 9 hole peg    Time 6    Period Weeks    Status On-going    Target Date 12/05/20      OT SHORT TERM GOAL #7   Title Pt will demonstrate at least 25% finger flexion/ extension in RUE in prep for functional use.    Baseline Limited PROM, and active digit  flexion, and extension. Improved AROM for right 2nd, and 3rd digits. significant limitations with the 4th, and 5th digits. 08/08/20: AROM digit MCP 2nd (80*), 3rd (85*), 4th (80*), 5th (90*)    Time 4    Period Weeks    Status Deferred             OT Long Term Goals - 09/21/20 1423      OT LONG TERM GOAL #1   Title Pt will perform all basic ADLs with supervision  Baseline Pt. requires minA from her husband following multiple recent surgeries. Pt. requires minA from her husband. 08/08/20: Pt continues to require MIN A for grooming and dressing tasks.    Time 12    Period Weeks    Status On-going    Target Date 12/05/20      OT LONG TERM GOAL #2   Title Pt will demonstrate 60* RUE shoulder flexion in prep for functional reach with pain less than or equal to 3/10.    Baseline Pt. continues to present with limited AROM for isolated  right shoulder flexion with pain 2 . 08/08/20: R AROM Shoulder flexion - 45* c 3/10 pain.    Time 12    Period Weeks    Status On-going    Target Date 12/05/20      OT LONG TERM GOAL #3   Title Pt will use RUE as a stabilizer/ gross A at least 30 % of the time for ADLs/ IADLs.    Baseline Pt. is using her right hand as a gross stabilizer 15% of the time. 08/08/20: requires MAX cues    Time 12    Period Weeks    Status On-going    Target Date 12/05/20      OT LONG TERM GOAL #4   Title Pt will increase LUE grip strength to 25 lbs or greater for increased ease with ADLs.    Baseline Pt. conitnues to present with limited left grip. Pt. has improved with left grip strength. Pt.continues to work towards 25#. 08/08/20: L grip 19#    Time 12    Period Weeks    Status On-going    Target Date 12/05/20      OT LONG TERM GOAL #5   Title Pt will perform simple home management/ snack prep with min A.    Baseline Pt. assists with filling the dishwasher, and performing laundry. Pt. continues to try to help when she can, however her husband mostly performs these  tasks. 08/08/20: Husband performs    Time 12    Period Weeks    Status On-going    Target Date 12/05/20      OT LONG TERM GOAL #6   Title Pt will demonstrate ability to grasp/ release a cup 2/3 trials with RUE.    Baseline Pt. continues to initiate grasp, and active release of objects, however is unable to grasp/release a cup. 08/08/20: Able to grasp, unable to release x1 trial    Time 12    Period Weeks    Status On-going    Target Date 12/05/20      OT LONG TERM GOAL #8   Title I with updated HEP.      OT LONG TERM GOAL  #9   TITLE Pt. will use her right hand to assist the left hand in searching for items in, and holding her purse.    Baseline Pt requires MIN cues to continually use RUE to hold purse.    Time 12    Period Weeks    Status On-going    Target Date 12/05/20      OT LONG TERM GOAL  #10   TITLE Pt. will use her right hand to assist the left to independently place eye glasses into a case.    Baseline Pt. has difficulty performing. 08/08/20: cues to incorporate RUE    Time 12    Period Weeks    Status Partially Met    Target Date 12/05/20  Plan - 09/29/20 1445    Clinical Impression Statement Pt. Reports that her insurance company would not approve  Botox injections. Pt. reports that she and her husband are planning to move to a 33 y.o., and older community. Pt. presented with  2/10 pain in her right shoulder, and handthumb. Slight increase in edema in the dorsal aspect of her forearm just distal to the wrist joint was noted. No reports of pain. Pt.continues to toleratemoist heat prior to, and in conjunction with ROM.Pt. presented with increased thumb ROM without increased reports in pain. Pt. Tolerated ROM well today.Pt. continues to present with Improving active digit grasp,and release with her right thumb, 2nd, and 3rd digits.Pt.continues to work on improving RUE ROM, and functional reaching in preparation for hand to face patterns for  self-care tasks.Pt. Continues to work on improving RUE ROM, and functional hand use during ADLs, and IADLtasks.   OT Occupational Profile and History Detailed Assessment- Review of Records and additional review of physical, cognitive, psychosocial history related to current functional performance    Occupational performance deficits (Please refer to evaluation for details): ADL's;IADL's;Rest and Sleep;Work;Play;Leisure    Body Structure / Function / Physical Skills ADL;Balance;Coordination;Decreased knowledge of precautions;Decreased knowledge of use of DME;Dexterity;Edema;Mobility;Tone;Strength;IADL;Sensation;GMC;Gait;ROM;FMC;Flexibility;Pain;Vision;UE functional use;Endurance    Cognitive Skills Attention;Perception;Problem Solve;Safety Awareness;Thought    Rehab Potential Good    Clinical Decision Making Several treatment options, min-mod task modification necessary    Comorbidities Affecting Occupational Performance: None    Modification or Assistance to Complete Evaluation  Min-Moderate modification of tasks or assist with assess necessary to complete eval    OT Frequency 3x / week    OT Duration 12 weeks    OT Treatment/Interventions Self-care/ADL training;Ultrasound;Energy conservation;Visual/perceptual remediation/compensation;Patient/family education;DME and/or AE instruction;Aquatic Therapy;Paraffin;Gait Training;Passive range of motion;Balance training;Fluidtherapy;Electrical Stimulation;Functional Mobility Training;Splinting;Moist Heat;Therapeutic exercise;Manual Therapy;Cognitive remediation/compensation;Manual lymph drainage;Neuromuscular education;Coping strategies training    Consulted and Agree with Plan of Care Patient    Family Member Consulted Spose present during the session.           Patient will benefit from skilled therapeutic intervention in order to improve the following deficits and impairments:   Body Structure / Function / Physical Skills:  ADL,Balance,Coordination,Decreased knowledge of precautions,Decreased knowledge of use of DME,Dexterity,Edema,Mobility,Tone,Strength,IADL,Sensation,GMC,Gait,ROM,FMC,Flexibility,Pain,Vision,UE functional use,Endurance Cognitive Skills: Attention,Perception,Problem Solve,Safety Awareness,Thought     Visit Diagnosis: Muscle weakness (generalized)    Problem List Patient Active Problem List   Diagnosis Date Noted  . Dyspareunia in female 04/05/2020  . History of abnormal cervical Pap smear 03/10/2020  . GIB (gastrointestinal bleeding) 02/28/2020  . Elevated BUN   . Prediabetes   . 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 Cerebral vasospasm   . Sinus tachycardia   . Dysphagia, post-stroke   . Thrombocytopenia ([place])   . Acute blood loss anemia   . Brain aneurysm   . ICH (intracerebral hemorrhage) ([place]) 01/05/2020  . History of adenomatous polyp of colon 02/07/2016  . 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 Fissure in ano 11/11/2014  . Hemorrhoids 11/11/2014  . Constipation 11/19/2013  . GERD (gastroesophageal reflux disease) 03/24/2013  . Irritable bowel syndrome with diarrhea 03/24/2013  . Herpes zoster 05/14/2005  . Abnormal Pap smear of cervix 05/15/1983  . History of cervical dysplasia 05/15/1983    Harrel Carina, MS, OTR/L 09/29/2020, 3:33 PM  [place] MAIN [facility] SERVICES [address] [place], [place], [address] Phone: [phone]   Fax:  [phone]  Name: Erika Stanton MRN: [id number] Date of Birth: Mar 10, 1956

## 2020-09-30 ENCOUNTER — Ambulatory Visit: Payer: BC Managed Care – PPO | Admitting: Occupational Therapy

## 2020-09-30 ENCOUNTER — Encounter: Payer: Self-pay | Admitting: Occupational Therapy

## 2020-09-30 DIAGNOSIS — R278 Other lack of coordination: Secondary | ICD-10-CM

## 2020-09-30 DIAGNOSIS — M6281 Muscle weakness (generalized): Secondary | ICD-10-CM

## 2020-09-30 DIAGNOSIS — I69159 Hemiplegia and hemiparesis following nontraumatic intracerebral hemorrhage affecting unspecified side: Secondary | ICD-10-CM

## 2020-09-30 NOTE — Therapy (Signed)
[place] MAIN [facility] SERVICES [address] [place], [place], [address] Phone: [phone]   Fax:  [phone]  Occupational Therapy Treatment  Patient Details  Name: Erika Stanton MRN: [id number] Date of Birth: 1956/01/08 Referring Provider (OT): Dr. Naaman Plummer   Encounter Date: 09/30/2020   OT End of Session - 09/30/20 1022    Visit Number 10    Number of Visits 36    Date for OT Re-Evaluation 12/05/20    Authorization Type Progress report period starting 09/21/2020    Authorization Time Period State BC[employer]    OT Start Time 1016    OT Stop Time 1100    OT Time Calculation (min) 44 min    Activity Tolerance Patient tolerated treatment well    Behavior During Therapy [facility] for tasks assessed/performed           Past Medical History:  Diagnosis Date  . Aneurysm ([place])   . Melena     Past Surgical History:  Procedure Laterality Date  . CHOLECYSTECTOMY    . COLONOSCOPY  11/2017   at [facility]. no recurrent polyps.  suggest repeat surveillance study 11/2022.    Marland Kitchen COLONOSCOPY W/ POLYPECTOMY  06/2014   Dr Roxy Manns at [facility].  3 adenomatoous polyps, anal fissure.    . ESOPHAGOGASTRODUODENOSCOPY (EGD) WITH PROPOFOL N/A 01/27/2020   Procedure: ESOPHAGOGASTRODUODENOSCOPY (EGD) WITH PROPOFOL;  Surgeon: Mauri Pole, MD;  Location: [facility];  Service: Endoscopy;  Laterality: N/A;  . IR 3D INDEPENDENT WKST  01/06/2020  . IR ANGIO INTRA EXTRACRAN SEL INTERNAL CAROTID BILAT MOD SED  01/06/2020  . IR ANGIO VERTEBRAL SEL VERTEBRAL UNI L MOD SED  01/06/2020  . IR ANGIOGRAM FOLLOW UP STUDY  01/06/2020  . IR ANGIOGRAM FOLLOW UP STUDY  01/06/2020  . IR ANGIOGRAM FOLLOW UP STUDY  01/06/2020  . IR ANGIOGRAM FOLLOW UP STUDY  01/06/2020  . IR ANGIOGRAM FOLLOW UP STUDY  01/06/2020  . IR ANGIOGRAM FOLLOW UP STUDY  01/06/2020  . IR ANGIOGRAM FOLLOW UP STUDY  01/06/2020  . IR ANGIOGRAM FOLLOW UP STUDY  01/06/2020  . IR ANGIOGRAM FOLLOW UP STUDY  01/06/2020  . IR  ANGIOGRAM FOLLOW UP STUDY  01/06/2020  . IR NEURO EACH ADD'L AFTER BASIC UNI RIGHT (MS)  01/06/2020  . IR TRANSCATH/EMBOLIZ  01/06/2020  . RADIOLOGY WITH ANESTHESIA N/A 01/06/2020   Procedure: IR WITH ANESTHESIA FOR ANEURYSM;  Surgeon: Consuella Lose, MD;  Location: [facility];  Service: Radiology;  Laterality: N/A;  . TUBAL LIGATION      There were no vitals filed for this visit.   Subjective Assessment - 09/30/20 1020    Subjective  Pt  reports that her doctors are working on approving another shot similar to Botox which she will trial in June.    Pertinent History ICH 01/06/20, Aneurysm. PMH: OA bilateral knees and hands    Repetition Decreases Symptoms    Patient Stated Goals Pt would like to be as independent as possible.    Currently in Pain? Yes    Pain Score 2     Pain Location Shoulder    Pain Orientation Right    Pain Descriptors / Indicators Aching    Pain Type Chronic pain    Pain Onset More than a month ago    Pain Onset More than a month ago           Therapeutic Exercise Moist heat applied to R shoulder and forearm/hand for 5 minutes prior to therapeutic  exercises to increase range of motion, tissue mobility and decrease pain.  Skin inspected prior to and after heat with no issues. Pt tolerated PROM in all joint ranges of the RUE, and hand including shoulder flexion, abduction, horizontal abduction, elbow flexion, extension, forearm supination, wrist flexion, extension, and digit MP flexion, and extension.   Neuromuscular Re-education Pt worked on grasping one inch resistive cubes using thumb opposition to the tip of the 2nd and 3rd digits. The board was positioned at a vertical angle. Pt. Worked on pressing them back into place while isolating 2nd through 5th digits.   Pt reports plan to trial hair clips and headbands as hair mgmt strategies. Plan to purchase and bring items at a later session to trial.               OT Education - 09/30/20 1022    Education  Details RUE functioning    Person(s) Educated Patient    Methods Explanation;Demonstration;Verbal cues;Handout    Comprehension Verbalized understanding;Returned demonstration;Verbal cues required            OT Short Term Goals - 09/12/20 1621      OT SHORT TERM GOAL #4   Title Pt will demonstrate 60* A/ROM shoulder flexion for RUE with pain less than or equal to 4/10.    Baseline Pt. presents with limited isolated  right shoulder flexion with pain continues to be 2. 08/08/20: 40* AROM with 3/10 pain    Time 4    Status Deferred      OT SHORT TERM GOAL #5   Title Pt will demonstrate improved LUE fine motor coordination for ADLs as evidenced by decreasing 9 hole peg test score to 25 secs or less.    Baseline Left FMC 41 sec visa the 9 hole peg test. 08/08/20: 32 seconds via 9 hole peg    Time 6    Period Weeks    Status On-going    Target Date 12/05/20      OT SHORT TERM GOAL #7   Title Pt will demonstrate at least 25% finger flexion/ extension in RUE in prep for functional use.    Baseline Limited PROM, and active digit flexion, and extension. Improved AROM for right 2nd, and 3rd digits. significant limitations with the 4th, and 5th digits. 08/08/20: AROM digit MCP 2nd (80*), 3rd (85*), 4th (80*), 5th (90*)    Time 4    Period Weeks    Status Deferred             OT Long Term Goals - 09/21/20 1423      OT LONG TERM GOAL #1   Title Pt will perform all basic ADLs with supervision    Baseline Pt. requires minA from her husband following multiple recent surgeries. Pt. requires minA from her husband. 08/08/20: Pt continues to require MIN A for grooming and dressing tasks.    Time 12    Period Weeks    Status On-going    Target Date 12/05/20      OT LONG TERM GOAL #2   Title Pt will demonstrate 60* RUE shoulder flexion in prep for functional reach with pain less than or equal to 3/10.    Baseline Pt. continues to present with limited AROM for isolated  right shoulder flexion  with pain 2 . 08/08/20: R AROM Shoulder flexion - 45* c 3/10 pain.    Time 12    Period Weeks    Status On-going    Target Date 12/05/20  OT LONG TERM GOAL #3   Title Pt will use RUE as a stabilizer/ gross A at least 30 % of the time for ADLs/ IADLs.    Baseline Pt. is using her right hand as a gross stabilizer 15% of the time. 08/08/20: requires MAX cues    Time 12    Period Weeks    Status On-going    Target Date 12/05/20      OT LONG TERM GOAL #4   Title Pt will increase LUE grip strength to 25 lbs or greater for increased ease with ADLs.    Baseline Pt. conitnues to present with limited left grip. Pt. has improved with left grip strength. Pt.continues to work towards 25#. 08/08/20: L grip 19#    Time 12    Period Weeks    Status On-going    Target Date 12/05/20      OT LONG TERM GOAL #5   Title Pt will perform simple home management/ snack prep with min A.    Baseline Pt. assists with filling the dishwasher, and performing laundry. Pt. continues to try to help when she can, however her husband mostly performs these tasks. 08/08/20: Husband performs    Time 12    Period Weeks    Status On-going    Target Date 12/05/20      OT LONG TERM GOAL #6   Title Pt will demonstrate ability to grasp/ release a cup 2/3 trials with RUE.    Baseline Pt. continues to initiate grasp, and active release of objects, however is unable to grasp/release a cup. 08/08/20: Able to grasp, unable to release x1 trial    Time 12    Period Weeks    Status On-going    Target Date 12/05/20      OT LONG TERM GOAL #8   Title I with updated HEP.      OT LONG TERM GOAL  #9   TITLE Pt. will use her right hand to assist the left hand in searching for items in, and holding her purse.    Baseline Pt requires MIN cues to continually use RUE to hold purse.    Time 12    Period Weeks    Status On-going    Target Date 12/05/20      OT LONG TERM GOAL  #10   TITLE Pt. will use her right hand to assist the left  to independently place eye glasses into a case.    Baseline Pt. has difficulty performing. 08/08/20: cues to incorporate RUE    Time 12    Period Weeks    Status Partially Met    Target Date 12/05/20                 Plan - 09/30/20 1022    Clinical Impression Statement Pt reports plan to trial alternative hair devices including clips and headbands. Pt presented with  2/10 pain in her right shoulder, and handthumb. Slight increase in edema in the dorsal aspect of her forearm just distal to the wrist joint was noted. Pt. continues to tolerate moist heat prior to, and in conjunction with ROM. Marland Kitchen Pt. continues to present with Improving active digit grasp, and release with her right thumb, 2nd, and 3rd digits. Pt. continues to work on improving RUE ROM, and functional reaching in preparation for hand to face patterns for self-care tasks. Pt. Continues to work on improving RUE ROM, and functional hand use during ADLs, and IADL tasks.  OT Occupational Profile and History Detailed Assessment- Review of Records and additional review of physical, cognitive, psychosocial history related to current functional performance    Occupational performance deficits (Please refer to evaluation for details): ADL's;IADL's;Rest and Sleep;Work;Play;Leisure    Body Structure / Function / Physical Skills ADL;Balance;Coordination;Decreased knowledge of precautions;Decreased knowledge of use of DME;Dexterity;Edema;Mobility;Tone;Strength;IADL;Sensation;GMC;Gait;ROM;FMC;Flexibility;Pain;Vision;UE functional use;Endurance    Cognitive Skills Attention;Perception;Problem Solve;Safety Awareness;Thought    Rehab Potential Good    Clinical Decision Making Several treatment options, min-mod task modification necessary    Comorbidities Affecting Occupational Performance: None    Modification or Assistance to Complete Evaluation  Min-Moderate modification of tasks or assist with assess necessary to complete eval    OT Frequency  3x / week    OT Duration 12 weeks    OT Treatment/Interventions Self-care/ADL training;Ultrasound;Energy conservation;Visual/perceptual remediation/compensation;Patient/family education;DME and/or AE instruction;Aquatic Therapy;Paraffin;Gait Training;Passive range of motion;Balance training;Fluidtherapy;Electrical Stimulation;Functional Mobility Training;Splinting;Moist Heat;Therapeutic exercise;Manual Therapy;Cognitive remediation/compensation;Manual lymph drainage;Neuromuscular education;Coping strategies training    Consulted and Agree with Plan of Care Patient    Family Member Consulted Spose present during the session.           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 Dyspareunia in female 04/05/2020  . History of abnormal cervical Pap smear 03/10/2020  . GIB (gastrointestinal bleeding) 02/28/2020  . Elevated BUN   . Prediabetes   . 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 Cerebral vasospasm   . Sinus tachycardia   . Dysphagia, post-stroke   . Thrombocytopenia ([place])   . Acute blood loss anemia   . Brain aneurysm   . ICH (intracerebral hemorrhage) ([place]) 01/05/2020  . History of adenomatous polyp of colon 02/07/2016  . 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 Fissure in ano 11/11/2014  . Hemorrhoids  11/11/2014  . Constipation 11/19/2013  . GERD (gastroesophageal reflux disease) 03/24/2013  . Irritable bowel syndrome with diarrhea 03/24/2013  . Herpes zoster 05/14/2005  . Abnormal Pap smear of cervix 05/15/1983  . History of cervical dysplasia 05/15/1983    Dessie Coma, M.S. OTR/L  09/30/20, 12:20 PM  ascom [phone]  [place] MAIN [facility] SERVICES [address] [place], [place], [address] Phone: [phone]   Fax:  [phone]  Name: Erika Stanton MRN: [id number] Date of Birth: July 12, 1955

## 2020-10-03 ENCOUNTER — Ambulatory Visit: Payer: BC Managed Care – PPO | Admitting: Occupational Therapy

## 2020-10-03 ENCOUNTER — Encounter
Payer: BC Managed Care – PPO | Attending: Physical Medicine and Rehabilitation | Admitting: Physical Medicine and Rehabilitation

## 2020-10-03 ENCOUNTER — Encounter: Payer: Self-pay | Admitting: Physical Medicine and Rehabilitation

## 2020-10-03 ENCOUNTER — Other Ambulatory Visit: Payer: Self-pay

## 2020-10-03 VITALS — BP 125/79 | HR 66 | Temp 98.1°F | Ht 67.0 in | Wt 187.4 lb

## 2020-10-03 DIAGNOSIS — R252 Cramp and spasm: Secondary | ICD-10-CM | POA: Insufficient documentation

## 2020-10-03 DIAGNOSIS — I69398 Other sequelae of cerebral infarction: Secondary | ICD-10-CM | POA: Insufficient documentation

## 2020-10-03 NOTE — Progress Notes (Signed)
Dysport 300U Injection for spasticity using needle US guidance  Indication: Severe spasticity which interferes with ADL,mobility and/or  hygiene and is unresponsive to medication management and other conservative care Informed consent was obtained after describing risks and benefits of the procedure with the patient. This includes bleeding, bruising, infection, excessive weakness, or medication side effects. A REMS form is on file and signed. Number of units per muscle:  FCR 100U FCU 100U FDS 100U FDP 100U All injections were done after obtaining appropriate [place] visualization and after negative drawback for blood. The patient tolerated the procedure well. Post procedure instructions were given. A followup appointment was made.

## 2020-10-05 ENCOUNTER — Other Ambulatory Visit: Payer: Self-pay

## 2020-10-05 ENCOUNTER — Encounter: Payer: Self-pay | Admitting: Occupational Therapy

## 2020-10-05 ENCOUNTER — Ambulatory Visit: Payer: BC Managed Care – PPO | Admitting: Occupational Therapy

## 2020-10-05 DIAGNOSIS — R278 Other lack of coordination: Secondary | ICD-10-CM

## 2020-10-05 DIAGNOSIS — M6281 Muscle weakness (generalized): Secondary | ICD-10-CM

## 2020-10-05 NOTE — Therapy (Signed)
[place] MAIN [facility] SERVICES [address] [place], [place], [address] Phone: [phone]   Fax:  [phone]  Occupational Therapy Treatment  Patient Details  Name: Erika Stanton MRN: [id number] Date of Birth: 05/18/1955 Referring Provider (OT): Dr. Naaman Plummer   Encounter Date: 10/05/2020   OT End of Session - 10/05/20 1010    Visit Number 56    Number of Visits 70    Date for OT Re-Evaluation 12/05/20    Authorization Type Progress report period starting 09/21/2020    Authorization Time [place]    OT Start Time 1015    Activity Tolerance Patient tolerated treatment well    Behavior During Therapy [facility] for tasks assessed/performed           Past Medical History:  Diagnosis Date  . Aneurysm ([place])   . Melena     Past Surgical History:  Procedure Laterality Date  . CHOLECYSTECTOMY    . COLONOSCOPY  11/2017   at [facility]. no recurrent polyps.  suggest repeat surveillance study 11/2022.    Marland Kitchen COLONOSCOPY W/ POLYPECTOMY  06/2014   Dr Roxy Manns at [facility].  3 adenomatoous polyps, anal fissure.    . ESOPHAGOGASTRODUODENOSCOPY (EGD) WITH PROPOFOL N/A 01/27/2020   Procedure: ESOPHAGOGASTRODUODENOSCOPY (EGD) WITH PROPOFOL;  Surgeon: Mauri Pole, MD;  Location: [facility] [facility];  Service: Endoscopy;  Laterality: N/A;  . IR 3D INDEPENDENT WKST  01/06/2020  . IR ANGIO INTRA EXTRACRAN SEL INTERNAL CAROTID BILAT MOD SED  01/06/2020  . IR ANGIO VERTEBRAL SEL VERTEBRAL UNI L MOD SED  01/06/2020  . IR ANGIOGRAM FOLLOW UP STUDY  01/06/2020  . IR ANGIOGRAM FOLLOW UP STUDY  01/06/2020  . IR ANGIOGRAM FOLLOW UP STUDY  01/06/2020  . IR ANGIOGRAM FOLLOW UP STUDY  01/06/2020  . IR ANGIOGRAM FOLLOW UP STUDY  01/06/2020  . IR ANGIOGRAM FOLLOW UP STUDY  01/06/2020  . IR ANGIOGRAM FOLLOW UP STUDY  01/06/2020  . IR ANGIOGRAM FOLLOW UP STUDY  01/06/2020  . IR ANGIOGRAM FOLLOW UP STUDY  01/06/2020  . IR ANGIOGRAM FOLLOW UP STUDY  01/06/2020  . IR NEURO EACH ADD'L AFTER  BASIC UNI RIGHT (MS)  01/06/2020  . IR TRANSCATH/EMBOLIZ  01/06/2020  . RADIOLOGY WITH ANESTHESIA N/A 01/06/2020   Procedure: IR WITH ANESTHESIA FOR ANEURYSM;  Surgeon: Consuella Lose, MD;  Location: [place];  Service: Radiology;  Laterality: N/A;  . TUBAL LIGATION      There were no vitals filed for this visit.   Subjective Assessment - 10/05/20 1010    Subjective  Pt reports she got her shot this week making her arm sore.    Pertinent History ICH 01/06/20, Aneurysm. PMH: OA bilateral knees and hands    Repetition Decreases Symptoms    Patient Stated Goals Pt would like to be as independent as possible.    Currently in Pain? Yes    Pain Score 2     Pain Location Shoulder    Pain Orientation Right    Pain Descriptors / Indicators Aching    Pain Type Chronic pain    Pain Onset More than a month ago    Multiple Pain Sites Yes    Pain Score 3    Pain Location Hand    Pain Orientation Right    Pain Descriptors / Indicators Sore    Pain Type Acute pain    Pain Onset More than a month ago  Therapeutic Exercise Pt tolerated PROM in all joint ranges of the R hand including forearm supination, wrist flexion, extension, and digit MP flexion, and extension. Moist heat applied to right elbow, forearm and hand for 5 minutes prior to therapeutic exercises to increase range of motion, tissue mobility and decrease pain.  Skin inspected prior to and after heat with no issues.  Neuromuscular Re-education Pt worked on grasping scrabble tiles from a container in lap and placing them into a cup at table height. Pt unable to grasp tiles one at a time, improved ability to release tiles this date. L hand assist to stabilize cup for R hand to grasp, difficulty pronating to pour tiles out of cup into container.              OT Education - 10/05/20 1010    Education Details RUE functioning    Person(s) Educated Patient    Methods Explanation;Demonstration;Verbal cues;Handout     Comprehension Verbalized understanding;Returned demonstration;Verbal cues required            OT Short Term Goals - 09/12/20 1621      OT SHORT TERM GOAL #4   Title Pt will demonstrate 60* A/ROM shoulder flexion for RUE with pain less than or equal to 4/10.    Baseline Pt. presents with limited isolated  right shoulder flexion with pain continues to be 2. 08/08/20: 40* AROM with 3/10 pain    Time 4    Status Deferred      OT SHORT TERM GOAL #5   Title Pt will demonstrate improved LUE fine motor coordination for ADLs as evidenced by decreasing 9 hole peg test score to 25 secs or less.    Baseline Left FMC 41 sec visa the 9 hole peg test. 08/08/20: 32 seconds via 9 hole peg    Time 6    Period Weeks    Status On-going    Target Date 12/05/20      OT SHORT TERM GOAL #7   Title Pt will demonstrate at least 25% finger flexion/ extension in RUE in prep for functional use.    Baseline Limited PROM, and active digit flexion, and extension. Improved AROM for right 2nd, and 3rd digits. significant limitations with the 4th, and 5th digits. 08/08/20: AROM digit MCP 2nd (80*), 3rd (85*), 4th (80*), 5th (90*)    Time 4    Period Weeks    Status Deferred             OT Long Term Goals - 09/21/20 1423      OT LONG TERM GOAL #1   Title Pt will perform all basic ADLs with supervision    Baseline Pt. requires minA from her husband following multiple recent surgeries. Pt. requires minA from her husband. 08/08/20: Pt continues to require MIN A for grooming and dressing tasks.    Time 12    Period Weeks    Status On-going    Target Date 12/05/20      OT LONG TERM GOAL #2   Title Pt will demonstrate 60* RUE shoulder flexion in prep for functional reach with pain less than or equal to 3/10.    Baseline Pt. continues to present with limited AROM for isolated  right shoulder flexion with pain 2 . 08/08/20: R AROM Shoulder flexion - 45* c 3/10 pain.    Time 12    Period Weeks    Status On-going     Target Date 12/05/20      OT  LONG TERM GOAL #3   Title Pt will use RUE as a stabilizer/ gross A at least 30 % of the time for ADLs/ IADLs.    Baseline Pt. is using her right hand as a gross stabilizer 15% of the time. 08/08/20: requires MAX cues    Time 12    Period Weeks    Status On-going    Target Date 12/05/20      OT LONG TERM GOAL #4   Title Pt will increase LUE grip strength to 25 lbs or greater for increased ease with ADLs.    Baseline Pt. conitnues to present with limited left grip. Pt. has improved with left grip strength. Pt.continues to work towards 25#. 08/08/20: L grip 19#    Time 12    Period Weeks    Status On-going    Target Date 12/05/20      OT LONG TERM GOAL #5   Title Pt will perform simple home management/ snack prep with min A.    Baseline Pt. assists with filling the dishwasher, and performing laundry. Pt. continues to try to help when she can, however her husband mostly performs these tasks. 08/08/20: Husband performs    Time 12    Period Weeks    Status On-going    Target Date 12/05/20      OT LONG TERM GOAL #6   Title Pt will demonstrate ability to grasp/ release a cup 2/3 trials with RUE.    Baseline Pt. continues to initiate grasp, and active release of objects, however is unable to grasp/release a cup. 08/08/20: Able to grasp, unable to release x1 trial    Time 12    Period Weeks    Status On-going    Target Date 12/05/20      OT LONG TERM GOAL #8   Title I with updated HEP.      OT LONG TERM GOAL  #9   TITLE Pt. will use her right hand to assist the left hand in searching for items in, and holding her purse.    Baseline Pt requires MIN cues to continually use RUE to hold purse.    Time 12    Period Weeks    Status On-going    Target Date 12/05/20      OT LONG TERM GOAL  #10   TITLE Pt. will use her right hand to assist the left to independently place eye glasses into a case.    Baseline Pt. has difficulty performing. 08/08/20: cues to  incorporate RUE    Time 12    Period Weeks    Status Partially Met    Target Date 12/05/20                 Plan - 10/05/20 1011    Clinical Impression Statement Pt presented with  2/10 pain in her right shoulder, and handthumb. Pt. continues to tolerate moist heat prior to, and in conjunction with ROM. Pt continues to present with Improving active digit grasp, and release with her right thumb, 2nd, and 3rd digits. Decreased supination/pronation limiting ability to pour items out of cup. Pt. Continues to work on improving RUE ROM, and functional hand use during ADLs, and IADL tasks.    OT Occupational Profile and History Detailed Assessment- Review of Records and additional review of physical, cognitive, psychosocial history related to current functional performance    Occupational performance deficits (Please refer to evaluation for details): ADL's;IADL's;Rest and Sleep;Work;Play;Leisure    Body Structure /  Function / Physical Skills ADL;Balance;Coordination;Decreased knowledge of precautions;Decreased knowledge of use of DME;Dexterity;Edema;Mobility;Tone;Strength;IADL;Sensation;GMC;Gait;ROM;FMC;Flexibility;Pain;Vision;UE functional use;Endurance    Cognitive Skills Attention;Perception;Problem Solve;Safety Awareness;Thought    Rehab Potential Good    Clinical Decision Making Several treatment options, min-mod task modification necessary    Comorbidities Affecting Occupational Performance: None    Modification or Assistance to Complete Evaluation  Min-Moderate modification of tasks or assist with assess necessary to complete eval    OT Frequency 3x / week    OT Duration 12 weeks    OT Treatment/Interventions Self-care/ADL training;Ultrasound;Energy conservation;Visual/perceptual remediation/compensation;Patient/family education;DME and/or AE instruction;Aquatic Therapy;Paraffin;Gait Training;Passive range of motion;Balance training;Fluidtherapy;Electrical Stimulation;Functional Mobility  Training;Splinting;Moist Heat;Therapeutic exercise;Manual Therapy;Cognitive remediation/compensation;Manual lymph drainage;Neuromuscular education;Coping strategies training    Consulted and Agree with Plan of Care Patient    Family Member Consulted Spose present during the session.           Patient will benefit from skilled therapeutic intervention in order to improve the following deficits and impairments:   Body Structure / Function / Physical Skills: ADL,Balance,Coordination,Decreased knowledge of precautions,Decreased knowledge of use of DME,Dexterity,Edema,Mobility,Tone,Strength,IADL,Sensation,GMC,Gait,ROM,FMC,Flexibility,Pain,Vision,UE functional use,Endurance Cognitive Skills: Attention,Perception,Problem Solve,Safety Awareness,Thought     Visit Diagnosis: Muscle weakness (generalized)  Other lack of coordination    Problem List Patient Active Problem List   Diagnosis Date Noted  . Dyspareunia in female 04/05/2020  . History of abnormal cervical Pap smear 03/10/2020  . GIB (gastrointestinal bleeding) 02/28/2020  . Elevated BUN   . Prediabetes   . 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 Cerebral vasospasm   . Sinus tachycardia   . Dysphagia, post-stroke   . Thrombocytopenia ([place])   . Acute blood loss anemia   . Brain aneurysm   . ICH (intracerebral hemorrhage) ([place]) 01/05/2020  . History of adenomatous polyp of colon 02/07/2016  . 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 Fissure in ano 11/11/2014  . Hemorrhoids 11/11/2014  . Constipation 11/19/2013  . GERD (gastroesophageal reflux disease) 03/24/2013  . Irritable bowel syndrome with diarrhea 03/24/2013  . Herpes zoster 05/14/2005  . Abnormal Pap smear of cervix 05/15/1983  . History of cervical dysplasia 05/15/1983    Erika Stanton, Erika Stanton  10/05/20, 12:06 PM  ascom [phone]  [place] MAIN [facility] SERVICES [address] [place], [place],  [address] Phone: [phone]   Fax:  [phone]  Name: Erika Stanton MRN: [id number] Date of Birth: 10/25/1955

## 2020-10-07 ENCOUNTER — Other Ambulatory Visit: Payer: Self-pay

## 2020-10-07 ENCOUNTER — Ambulatory Visit: Payer: BC Managed Care – PPO | Admitting: Occupational Therapy

## 2020-10-07 DIAGNOSIS — M6281 Muscle weakness (generalized): Secondary | ICD-10-CM | POA: Diagnosis not present

## 2020-10-07 DIAGNOSIS — R278 Other lack of coordination: Secondary | ICD-10-CM

## 2020-10-07 NOTE — Therapy (Signed)
[place] MAIN [facility] SERVICES [address] [place], [place], [address] Phone: [phone]   Fax:  [phone]  Occupational Therapy Treatment  Patient Details  Name: CARIDAD SILVEIRA MRN: [id number] Date of Birth: [birth year] Referring Provider (OT): Dr. Naaman Plummer   Encounter Date: 10/07/2020   OT End of Session - 10/07/20 1018    Visit Number 31    Number of Visits 51    Date for OT Re-Evaluation 12/05/20    Authorization Type Progress report period starting 09/21/2020    Authorization Time Period State [employer]    OT Start Time 1015    OT Stop Time 1100    OT Time Calculation (min) 45 min    Activity Tolerance Patient tolerated treatment well    Behavior During Therapy [facility] for tasks assessed/performed           Past Medical History:  Diagnosis Date  . Aneurysm ([place])   . Melena     Past Surgical History:  Procedure Laterality Date  . CHOLECYSTECTOMY    . COLONOSCOPY  11/2017   at [facility]. no recurrent polyps.  suggest repeat surveillance study 11/2022.    Marland Kitchen COLONOSCOPY W/ POLYPECTOMY  06/2014   Dr Roxy Manns at [facility].  3 adenomatoous polyps, anal fissure.    . ESOPHAGOGASTRODUODENOSCOPY (EGD) WITH PROPOFOL N/A 01/27/2020   Procedure: ESOPHAGOGASTRODUODENOSCOPY (EGD) WITH PROPOFOL;  Surgeon: Mauri Pole, MD;  Location: [place] [facility];  Service: Endoscopy;  Laterality: N/A;  . IR 3D INDEPENDENT WKST  01/06/2020  . IR ANGIO INTRA EXTRACRAN SEL INTERNAL CAROTID BILAT MOD SED  01/06/2020  . IR ANGIO VERTEBRAL SEL VERTEBRAL UNI L MOD SED  01/06/2020  . IR ANGIOGRAM FOLLOW UP STUDY  01/06/2020  . IR ANGIOGRAM FOLLOW UP STUDY  01/06/2020  . IR ANGIOGRAM FOLLOW UP STUDY  01/06/2020  . IR ANGIOGRAM FOLLOW UP STUDY  01/06/2020  . IR ANGIOGRAM FOLLOW UP STUDY  01/06/2020  . IR ANGIOGRAM FOLLOW UP STUDY  01/06/2020  . IR ANGIOGRAM FOLLOW UP STUDY  01/06/2020  . IR ANGIOGRAM FOLLOW UP STUDY  01/06/2020  . IR ANGIOGRAM FOLLOW UP STUDY  01/06/2020  . IR  ANGIOGRAM FOLLOW UP STUDY  01/06/2020  . IR NEURO EACH ADD'L AFTER BASIC UNI RIGHT (MS)  01/06/2020  . IR TRANSCATH/EMBOLIZ  01/06/2020  . RADIOLOGY WITH ANESTHESIA N/A 01/06/2020   Procedure: IR WITH ANESTHESIA FOR ANEURYSM;  Surgeon: Consuella Lose, MD;  Location: [place];  Service: Radiology;  Laterality: N/A;  . TUBAL LIGATION      There were no vitals filed for this visit.   Subjective Assessment - 10/07/20 1012    Subjective  Pt reports she has more movement in her wrost this week.    Pertinent History ICH 01/06/20, Aneurysm. PMH: OA bilateral knees and hands    Repetition Decreases Symptoms    Patient Stated Goals Pt would like to be as independent as possible.    Currently in Pain? Yes    Pain Score 2     Pain Location Shoulder    Pain Orientation Right    Pain Descriptors / Indicators Aching    Pain Type Chronic pain    Pain Onset More than a month ago    Pain Onset More than a month ago            Therapeutic Exercise  Pt tolerated PROM in all joint ranges of the R hand including forearm supination, wrist flexion, extension, and digit MP flexion, and  extension. Moist heat applied to right elbow, forearm and hand for 5 minutes prior to therapeutic exercises to increase range of motion, tissue mobility and decrease pain.  Skin inspected prior to and after heat with no issues.    Self Care Pt completed hair brushing with RUE using comb with built up handle. Achieved reaching L side of head with thoroughness, MIN A to support R shoulder flexion to reach R side of head, difficulty bending wrist for smooth brushing.  Neuromuscular Re-education  Pt worked on grasping one inch resistive cubes alternating thumb opposition to the tip of the 2nd through 5th digits. The board was positioned at a horizontal then vertical angle. Pt. Worked on pressing them back into place while isolating 2nd through 5th digits.                    OT Education - 10/07/20 1018     Education Details RUE functioning    Person(s) Educated Patient    Methods Explanation;Demonstration;Verbal cues;Handout    Comprehension Verbalized understanding;Returned demonstration;Verbal cues required            OT Short Term Goals - 09/12/20 1621      OT SHORT TERM GOAL #4   Title Pt will demonstrate 60* A/ROM shoulder flexion for RUE with pain less than or equal to 4/10.    Baseline Pt. presents with limited isolated  right shoulder flexion with pain continues to be 2. 08/08/20: 40* AROM with 3/10 pain    Time 4    Status Deferred      OT SHORT TERM GOAL #5   Title Pt will demonstrate improved LUE fine motor coordination for ADLs as evidenced by decreasing 9 hole peg test score to 25 secs or less.    Baseline Left FMC 41 sec visa the 9 hole peg test. 08/08/20: 32 seconds via 9 hole peg    Time 6    Period Weeks    Status On-going    Target Date 12/05/20      OT SHORT TERM GOAL #7   Title Pt will demonstrate at least 25% finger flexion/ extension in RUE in prep for functional use.    Baseline Limited PROM, and active digit flexion, and extension. Improved AROM for right 2nd, and 3rd digits. significant limitations with the 4th, and 5th digits. 08/08/20: AROM digit MCP 2nd (80*), 3rd (85*), 4th (80*), 5th (90*)    Time 4    Period Weeks    Status Deferred             OT Long Term Goals - 09/21/20 1423      OT LONG TERM GOAL #1   Title Pt will perform all basic ADLs with supervision    Baseline Pt. requires minA from her husband following multiple recent surgeries. Pt. requires minA from her husband. 08/08/20: Pt continues to require MIN A for grooming and dressing tasks.    Time 12    Period Weeks    Status On-going    Target Date 12/05/20      OT LONG TERM GOAL #2   Title Pt will demonstrate 60* RUE shoulder flexion in prep for functional reach with pain less than or equal to 3/10.    Baseline Pt. continues to present with limited AROM for isolated  right shoulder  flexion with pain 2 . 08/08/20: R AROM Shoulder flexion - 45* c 3/10 pain.    Time 12    Period Weeks  Status On-going    Target Date 12/05/20      OT LONG TERM GOAL #3   Title Pt will use RUE as a stabilizer/ gross A at least 30 % of the time for ADLs/ IADLs.    Baseline Pt. is using her right hand as a gross stabilizer 15% of the time. 08/08/20: requires MAX cues    Time 12    Period Weeks    Status On-going    Target Date 12/05/20      OT LONG TERM GOAL #4   Title Pt will increase LUE grip strength to 25 lbs or greater for increased ease with ADLs.    Baseline Pt. conitnues to present with limited left grip. Pt. has improved with left grip strength. Pt.continues to work towards 25#. 08/08/20: L grip 19#    Time 12    Period Weeks    Status On-going    Target Date 12/05/20      OT LONG TERM GOAL #5   Title Pt will perform simple home management/ snack prep with min A.    Baseline Pt. assists with filling the dishwasher, and performing laundry. Pt. continues to try to help when she can, however her husband mostly performs these tasks. 08/08/20: Husband performs    Time 12    Period Weeks    Status On-going    Target Date 12/05/20      OT LONG TERM GOAL #6   Title Pt will demonstrate ability to grasp/ release a cup 2/3 trials with RUE.    Baseline Pt. continues to initiate grasp, and active release of objects, however is unable to grasp/release a cup. 08/08/20: Able to grasp, unable to release x1 trial    Time 12    Period Weeks    Status On-going    Target Date 12/05/20      OT LONG TERM GOAL #8   Title I with updated HEP.      OT LONG TERM GOAL  #9   TITLE Pt. will use her right hand to assist the left hand in searching for items in, and holding her purse.    Baseline Pt requires MIN cues to continually use RUE to hold purse.    Time 12    Period Weeks    Status On-going    Target Date 12/05/20      OT LONG TERM GOAL  #10   TITLE Pt. will use her right hand to assist  the left to independently place eye glasses into a case.    Baseline Pt. has difficulty performing. 08/08/20: cues to incorporate RUE    Time 12    Period Weeks    Status Partially Met    Target Date 12/05/20                 Plan - 10/07/20 1019    Clinical Impression Statement Pt presented with  2/10 pain in her right shoulder, imporived ROM in R wrist. Pt continues to tolerate moist heat prior to, and in conjunction with ROM. Pt continues to present with Improving active digit grasp, and release with her right thumb, 2nd, and 3rd digits. Improved R shoulder ROM for hair brushing this date. Pt. Continues to work on improving RUE ROM, and functional hand use during ADLs, and IADL tasks.    OT Occupational Profile and History Detailed Assessment- Review of Records and additional review of physical, cognitive, psychosocial history related to current functional performance    Occupational performance  deficits (Please refer to evaluation for details): ADL's;IADL's;Rest and Sleep;Work;Play;Leisure    Body Structure / Function / Physical Skills ADL;Balance;Coordination;Decreased knowledge of precautions;Decreased knowledge of use of DME;Dexterity;Edema;Mobility;Tone;Strength;IADL;Sensation;GMC;Gait;ROM;FMC;Flexibility;Pain;Vision;UE functional use;Endurance    Cognitive Skills Attention;Perception;Problem Solve;Safety Awareness;Thought    Rehab Potential Good    Clinical Decision Making Several treatment options, min-mod task modification necessary    Comorbidities Affecting Occupational Performance: None    Modification or Assistance to Complete Evaluation  Min-Moderate modification of tasks or assist with assess necessary to complete eval    OT Frequency 3x / week    OT Duration 12 weeks    OT Treatment/Interventions Self-care/ADL training;Ultrasound;Energy conservation;Visual/perceptual remediation/compensation;Patient/family education;DME and/or AE instruction;Aquatic Therapy;Paraffin;Gait  Training;Passive range of motion;Balance training;Fluidtherapy;Electrical Stimulation;Functional Mobility Training;Splinting;Moist Heat;Therapeutic exercise;Manual Therapy;Cognitive remediation/compensation;Manual lymph drainage;Neuromuscular education;Coping strategies training    Consulted and Agree with Plan of Care Patient           Patient will benefit from skilled therapeutic intervention in order to improve the following deficits and impairments:   Body Structure / Function / Physical Skills: ADL,Balance,Coordination,Decreased knowledge of precautions,Decreased knowledge of use of DME,Dexterity,Edema,Mobility,Tone,Strength,IADL,Sensation,GMC,Gait,ROM,FMC,Flexibility,Pain,Vision,UE functional use,Endurance Cognitive Skills: Attention,Perception,Problem Solve,Safety Awareness,Thought     Visit Diagnosis: Muscle weakness (generalized)  Other lack of coordination    Problem List Patient Active Problem List   Diagnosis Date Noted  . Dyspareunia in female 04/05/2020  . History of abnormal cervical Pap smear 03/10/2020  . GIB (gastrointestinal bleeding) 02/28/2020  . Elevated BUN   . Prediabetes   . 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 Cerebral vasospasm   . Sinus tachycardia   . Dysphagia, post-stroke   . Thrombocytopenia ([place])   . Acute blood loss anemia   . Brain aneurysm   . ICH (intracerebral hemorrhage) ([place]) 01/05/2020  . History of adenomatous polyp of colon 02/07/2016  . 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 Fissure in ano 11/11/2014  . Hemorrhoids 11/11/2014  . Constipation 11/19/2013  . GERD (gastroesophageal reflux disease) 03/24/2013  . Irritable bowel syndrome with diarrhea 03/24/2013  . Herpes zoster 05/14/2005  . Abnormal Pap smear of cervix 05/15/1983  . History of cervical dysplasia 05/15/1983    Dessie Coma, M.S. OTR/L  10/07/20, 11:00 AM  ascom [phone]  [place] MAIN [facility]  SERVICES [address] [place], [place], [address] Phone: [phone]   Fax:  [phone]  Name: ALIEYAH SPADER MRN: [id number] Date of Birth: Oct 10, 1955

## 2020-10-12 ENCOUNTER — Encounter: Payer: BC Managed Care – PPO | Admitting: Occupational Therapy

## 2020-10-14 ENCOUNTER — Ambulatory Visit: Payer: BC Managed Care – PPO | Attending: Physical Medicine & Rehabilitation | Admitting: Occupational Therapy

## 2020-10-14 ENCOUNTER — Other Ambulatory Visit: Payer: Self-pay

## 2020-10-14 DIAGNOSIS — M25511 Pain in right shoulder: Secondary | ICD-10-CM | POA: Diagnosis present

## 2020-10-14 DIAGNOSIS — M79641 Pain in right hand: Secondary | ICD-10-CM | POA: Insufficient documentation

## 2020-10-14 DIAGNOSIS — I69159 Hemiplegia and hemiparesis following nontraumatic intracerebral hemorrhage affecting unspecified side: Secondary | ICD-10-CM | POA: Diagnosis present

## 2020-10-14 DIAGNOSIS — M6281 Muscle weakness (generalized): Secondary | ICD-10-CM | POA: Insufficient documentation

## 2020-10-14 DIAGNOSIS — R278 Other lack of coordination: Secondary | ICD-10-CM | POA: Insufficient documentation

## 2020-10-14 DIAGNOSIS — I607 Nontraumatic subarachnoid hemorrhage from unspecified intracranial artery: Secondary | ICD-10-CM | POA: Insufficient documentation

## 2020-10-15 NOTE — Therapy (Signed)
[place] [facility] SERVICES [address] [place], [place], [address] Phone: [phone]   Fax:  [phone]  Occupational Therapy Treatment  Patient Details  Name: Erika Stanton MRN: [id number] Date of Birth: 1955-10-17 Referring Provider (OT): Dr. Naaman Plummer   Encounter Date: 10/14/2020   OT End of Session - 10/15/20 1655    Visit Number [id number]    Number of Visits 54    Date for OT Re-Evaluation 12/05/20    Authorization Type Progress report period starting 09/21/2020    Authorization Time [place]    OT Start Time 1015    OT Stop Time 1105    OT Time Calculation (min) 50 min    Activity Tolerance Patient tolerated treatment well    Behavior During Therapy [facility] for tasks assessed/performed           Past Medical History:  Diagnosis Date  . Aneurysm ([place])   . Melena     Past Surgical History:  Procedure Laterality Date  . CHOLECYSTECTOMY    . COLONOSCOPY  11/2017   at [facility] no recurrent polyps.  suggest repeat surveillance study 11/2022.    Marland Kitchen COLONOSCOPY W/ POLYPECTOMY  06/2014   Dr Roxy Manns at [facility].  3 adenomatoous polyps, anal fissure.    . ESOPHAGOGASTRODUODENOSCOPY (EGD) WITH PROPOFOL N/A 01/27/2020   Procedure: ESOPHAGOGASTRODUODENOSCOPY (EGD) WITH PROPOFOL;  Surgeon: Mauri Pole, MD;  Location: [place] ENDOSCOPY;  Service: Endoscopy;  Laterality: N/A;  . IR 3D INDEPENDENT WKST  01/06/2020  . IR ANGIO INTRA EXTRACRAN SEL INTERNAL CAROTID BILAT MOD SED  01/06/2020  . IR ANGIO VERTEBRAL SEL VERTEBRAL UNI L MOD SED  01/06/2020  . IR ANGIOGRAM FOLLOW UP STUDY  01/06/2020  . IR ANGIOGRAM FOLLOW UP STUDY  01/06/2020  . IR ANGIOGRAM FOLLOW UP STUDY  01/06/2020  . IR ANGIOGRAM FOLLOW UP STUDY  01/06/2020  . IR ANGIOGRAM FOLLOW UP STUDY  01/06/2020  . IR ANGIOGRAM FOLLOW UP STUDY  01/06/2020  . IR ANGIOGRAM FOLLOW UP STUDY  01/06/2020  . IR ANGIOGRAM FOLLOW UP STUDY  01/06/2020  . IR ANGIOGRAM FOLLOW UP STUDY  01/06/2020  . IR ANGIOGRAM  FOLLOW UP STUDY  01/06/2020  . IR NEURO EACH ADD'L AFTER BASIC UNI RIGHT (MS)  01/06/2020  . IR TRANSCATH/EMBOLIZ  01/06/2020  . RADIOLOGY WITH ANESTHESIA N/A 01/06/2020   Procedure: IR WITH ANESTHESIA FOR ANEURYSM;  Surgeon: Consuella Lose, MD;  Location: [place];  Service: Radiology;  Laterality: N/A;  . TUBAL LIGATION      There were no vitals filed for this visit.   Subjective Assessment - 10/15/20 1654    Subjective  Pt reports she will have radiation soon, will have to have 5 sessions.    Pertinent History ICH 01/06/20, Aneurysm. PMH: OA bilateral knees and hands    Patient Stated Goals Pt would like to be as independent as possible.    Currently in Pain? Yes    Pain Score 2     Pain Location Shoulder    Pain Orientation Right    Pain Descriptors / Indicators Aching    Pain Type Chronic pain    Pain Onset More than a month ago    Pain Frequency Intermittent            Pt received recent injection of Dysport, botox like medication to help manage spasticity in right UE.   Applied moist heat to right shoulder, forearm, wrist and hand for 5 mins prior to  therapeutic exercises.   Therapeutic Exercises: AAROM for scapular elevation, retraction, shoulder rolls and upwards rotation followed by PROM of right shoulder within pain tolerance, flexion to 90 degrees, ABD, elbow flexion/extension, forearm supination/pronation, wrist flexion/extension, digit flexion/extension.  AAROM for same movement patterns with facilitory techniques.   Grasp and release patterns with right hand to pick up objects greater than 1 inch in size.  Picking up jumbo pegs from lap and placing onto tabletop and then back into container, occasional min assist, cues for grasp and release patterns.    Response to tx:   Patient demonstrating decreased spasticity in right UE hand and digits this date with ROM exercises.  Demonstrates some improved motion in the hand but still has spasticity present affecting more of  ring and small finger on the right.  Mild to moderate difficulty with grasp and release patterns with objects, shoulder remains limited to around 90 degrees of active shoulder motion when placing items on tabletop.  Continue to work towards goals in plan of care and work in conjunction with recent injection therapy for management of spasticity.                      OT Education - 10/15/20 1655    Education Details RUE functioning, ROM, grasp/release    Person(s) Educated Patient    Methods Explanation;Demonstration;Verbal cues    Comprehension Verbalized understanding;Returned demonstration;Verbal cues required            OT Short Term Goals - 09/12/20 1621      OT SHORT TERM GOAL #4   Title Pt will demonstrate 60* A/ROM shoulder flexion for RUE with pain less than or equal to 4/10.    Baseline Pt. presents with limited isolated  right shoulder flexion with pain continues to be 2. 08/08/20: 40* AROM with 3/10 pain    Time 4    Status Deferred      OT SHORT TERM GOAL #5   Title Pt will demonstrate improved LUE fine motor coordination for ADLs as evidenced by decreasing 9 hole peg test score to 25 secs or less.    Baseline Left FMC 41 sec visa the 9 hole peg test. 08/08/20: 32 seconds via 9 hole peg    Time 6    Period Weeks    Status On-going    Target Date 12/05/20      OT SHORT TERM GOAL #7   Title Pt will demonstrate at least 25% finger flexion/ extension in RUE in prep for functional use.    Baseline Limited PROM, and active digit flexion, and extension. Improved AROM for right 2nd, and 3rd digits. significant limitations with the 4th, and 5th digits. 08/08/20: AROM digit MCP 2nd (80*), 3rd (85*), 4th (80*), 5th (90*)    Time 4    Period Weeks    Status Deferred             OT Long Term Goals - 09/21/20 1423      OT LONG TERM GOAL #1   Title Pt will perform all basic ADLs with supervision    Baseline Pt. requires minA from her husband following multiple  recent surgeries. Pt. requires minA from her husband. 08/08/20: Pt continues to require MIN A for grooming and dressing tasks.    Time 12    Period Weeks    Status On-going    Target Date 12/05/20      OT LONG TERM GOAL #2   Title Pt will demonstrate  60* RUE shoulder flexion in prep for functional reach with pain less than or equal to 3/10.    Baseline Pt. continues to present with limited AROM for isolated  right shoulder flexion with pain 2 . 08/08/20: R AROM Shoulder flexion - 45* c 3/10 pain.    Time 12    Period Weeks    Status On-going    Target Date 12/05/20      OT LONG TERM GOAL #3   Title Pt will use RUE as a stabilizer/ gross A at least 30 % of the time for ADLs/ IADLs.    Baseline Pt. is using her right hand as a gross stabilizer 15% of the time. 08/08/20: requires MAX cues    Time 12    Period Weeks    Status On-going    Target Date 12/05/20      OT LONG TERM GOAL #4   Title Pt will increase LUE grip strength to 25 lbs or greater for increased ease with ADLs.    Baseline Pt. conitnues to present with limited left grip. Pt. has improved with left grip strength. Pt.continues to work towards 25#. 08/08/20: L grip 19#    Time 12    Period Weeks    Status On-going    Target Date 12/05/20      OT LONG TERM GOAL #5   Title Pt will perform simple home management/ snack prep with min A.    Baseline Pt. assists with filling the dishwasher, and performing laundry. Pt. continues to try to help when she can, however her husband mostly performs these tasks. 08/08/20: Husband performs    Time 12    Period Weeks    Status On-going    Target Date 12/05/20      OT LONG TERM GOAL #6   Title Pt will demonstrate ability to grasp/ release a cup 2/3 trials with RUE.    Baseline Pt. continues to initiate grasp, and active release of objects, however is unable to grasp/release a cup. 08/08/20: Able to grasp, unable to release x1 trial    Time 12    Period Weeks    Status On-going    Target  Date 12/05/20      OT LONG TERM GOAL #8   Title I with updated HEP.      OT LONG TERM GOAL  #9   TITLE Pt. will use her right hand to assist the left hand in searching for items in, and holding her purse.    Baseline Pt requires MIN cues to continually use RUE to hold purse.    Time 12    Period Weeks    Status On-going    Target Date 12/05/20      OT LONG TERM GOAL  #10   TITLE Pt. will use her right hand to assist the left to independently place eye glasses into a case.    Baseline Pt. has difficulty performing. 08/08/20: cues to incorporate RUE    Time 12    Period Weeks    Status Partially Met    Target Date 12/05/20                 Plan - 10/15/20 1655    Clinical Impression Statement Patient demonstrating decreased spasticity in right UE hand and digits this date with ROM exercises.  Demonstrates some improved motion in the hand but still has spasticity present affecting more of ring and small finger on the right.  Mild to moderate difficulty  with grasp and release patterns with objects, shoulder remains limited to around 90 degrees of active shoulder motion when placing items on tabletop.  Continue to work towards goals in plan of care and work in conjunction with recent injection therapy for management of spasticity.    OT Occupational Profile and History Detailed Assessment- Review of Records and additional review of physical, cognitive, psychosocial history related to current functional performance    Occupational performance deficits (Please refer to evaluation for details): ADL's;IADL's;Rest and Sleep;Work;Play;Leisure    Body Structure / Function / Physical Skills ADL;Balance;Coordination;Decreased knowledge of precautions;Decreased knowledge of use of DME;Dexterity;Edema;Mobility;Tone;Strength;IADL;Sensation;GMC;Gait;ROM;FMC;Flexibility;Pain;Vision;UE functional use;Endurance    Cognitive Skills Attention;Perception;Problem Solve;Safety Awareness;Thought    Rehab  Potential Good    Clinical Decision Making Several treatment options, min-mod task modification necessary    Comorbidities Affecting Occupational Performance: None    Modification or Assistance to Complete Evaluation  Min-Moderate modification of tasks or assist with assess necessary to complete eval    OT Frequency 3x / week    OT Duration 12 weeks    OT Treatment/Interventions Self-care/ADL training;Ultrasound;Energy conservation;Visual/perceptual remediation/compensation;Patient/family education;DME and/or AE instruction;Aquatic Therapy;Paraffin;Gait Training;Passive range of motion;Balance training;Fluidtherapy;Electrical Stimulation;Functional Mobility Training;Splinting;Moist Heat;Therapeutic exercise;Manual Therapy;Cognitive remediation/compensation;Manual lymph drainage;Neuromuscular education;Coping strategies training    Consulted and Agree with Plan of Care Patient           Patient will benefit from skilled therapeutic intervention in order to improve the following deficits and impairments:   Body Structure / Function / Physical Skills: ADL,Balance,Coordination,Decreased knowledge of precautions,Decreased knowledge of use of DME,Dexterity,Edema,Mobility,Tone,Strength,IADL,Sensation,GMC,Gait,ROM,FMC,Flexibility,Pain,Vision,UE functional use,Endurance Cognitive Skills: Attention,Perception,Problem Solve,Safety Awareness,Thought     Visit Diagnosis: Muscle weakness (generalized)  Other lack of coordination  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 Dyspareunia in female 04/05/2020  . History of abnormal cervical Pap smear 03/10/2020  . GIB (gastrointestinal bleeding) 02/28/2020  . Elevated BUN   . Prediabetes   . 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 Cerebral vasospasm   . Sinus tachycardia   . Dysphagia, post-stroke   .  Thrombocytopenia ([place])   . Acute blood loss anemia   . Brain aneurysm   . ICH (intracerebral hemorrhage) ([place]) 01/05/2020  . History of adenomatous polyp of colon 02/07/2016  . 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 Fissure in ano 11/11/2014  . Hemorrhoids 11/11/2014  . Constipation 11/19/2013  . GERD (gastroesophageal reflux disease) 03/24/2013  . Irritable bowel syndrome with diarrhea 03/24/2013  . Herpes zoster 05/14/2005  . Abnormal Pap smear of cervix 05/15/1983  . History of cervical dysplasia 05/15/1983   Achilles Dunk, OTR/L, CLT  Erika Stanton 10/15/2020, 5:08 PM  [place] [facility] SERVICES [address] [place], [place], [address] Phone: [phone]   Fax:  [phone]  Name: Erika Stanton MRN: [id number] Date of Birth: [birth year]

## 2020-10-17 ENCOUNTER — Other Ambulatory Visit: Payer: Self-pay

## 2020-10-17 ENCOUNTER — Encounter: Payer: Self-pay | Admitting: Occupational Therapy

## 2020-10-17 ENCOUNTER — Ambulatory Visit: Payer: BC Managed Care – PPO | Admitting: Occupational Therapy

## 2020-10-17 DIAGNOSIS — M6281 Muscle weakness (generalized): Secondary | ICD-10-CM | POA: Diagnosis not present

## 2020-10-17 DIAGNOSIS — R278 Other lack of coordination: Secondary | ICD-10-CM

## 2020-10-17 DIAGNOSIS — I69159 Hemiplegia and hemiparesis following nontraumatic intracerebral hemorrhage affecting unspecified side: Secondary | ICD-10-CM

## 2020-10-19 ENCOUNTER — Ambulatory Visit: Payer: BC Managed Care – PPO | Admitting: Occupational Therapy

## 2020-10-19 ENCOUNTER — Other Ambulatory Visit: Payer: Self-pay

## 2020-10-19 DIAGNOSIS — M6281 Muscle weakness (generalized): Secondary | ICD-10-CM | POA: Diagnosis not present

## 2020-10-19 DIAGNOSIS — I69159 Hemiplegia and hemiparesis following nontraumatic intracerebral hemorrhage affecting unspecified side: Secondary | ICD-10-CM

## 2020-10-19 DIAGNOSIS — R278 Other lack of coordination: Secondary | ICD-10-CM

## 2020-10-20 ENCOUNTER — Encounter: Payer: Self-pay | Admitting: Occupational Therapy

## 2020-10-20 NOTE — Therapy (Signed)
[facility] [address] [place], [place], [address] Phone: [phone]   Fax:  [phone]  Occupational Therapy Treatment  Patient Details  Name: Erika Stanton MRN: [id number] Date of Birth: 08/15/1955 Referring Provider (OT): Dr. Naaman Plummer   Encounter Date: 10/17/2020   OT End of Session - 10/19/20 0846     Visit Number 34    Number of Visits 17    Date for OT Re-Evaluation 12/05/20    Authorization Type Progress report period starting 09/21/2020    Authorization Time Period State BC[employer]    OT Start Time 1017    OT Stop Time 1100    OT Time Calculation (min) 43 min    Activity Tolerance Patient tolerated treatment well    Behavior During Therapy [facility] for tasks assessed/performed             Past Medical History:  Diagnosis Date   Aneurysm ([place])    Melena     Past Surgical History:  Procedure Laterality Date   CHOLECYSTECTOMY     COLONOSCOPY  11/2017   at [facility]. no recurrent polyps.  suggest repeat surveillance study 11/2022.     COLONOSCOPY W/ POLYPECTOMY  06/2014   Dr Roxy Manns at [facility].  3 adenomatoous polyps, anal fissure.     ESOPHAGOGASTRODUODENOSCOPY (EGD) WITH PROPOFOL N/A 01/27/2020   Procedure: ESOPHAGOGASTRODUODENOSCOPY (EGD) WITH PROPOFOL;  Surgeon: Mauri Pole, MD;  Location: [facility];  Service: Endoscopy;  Laterality: N/A;   IR 3D INDEPENDENT WKST  01/06/2020   IR ANGIO INTRA EXTRACRAN SEL INTERNAL CAROTID BILAT MOD SED  01/06/2020   IR ANGIO VERTEBRAL SEL VERTEBRAL UNI L MOD SED  01/06/2020   IR ANGIOGRAM FOLLOW UP STUDY  01/06/2020   IR ANGIOGRAM FOLLOW UP STUDY  01/06/2020   IR ANGIOGRAM FOLLOW UP STUDY  01/06/2020   IR ANGIOGRAM FOLLOW UP STUDY  01/06/2020   IR ANGIOGRAM FOLLOW UP STUDY  01/06/2020   IR ANGIOGRAM FOLLOW UP STUDY  01/06/2020   IR ANGIOGRAM FOLLOW UP STUDY  01/06/2020   IR ANGIOGRAM FOLLOW UP STUDY  01/06/2020   IR ANGIOGRAM FOLLOW UP STUDY  01/06/2020   IR ANGIOGRAM FOLLOW UP  STUDY  01/06/2020   IR NEURO EACH ADD'L AFTER BASIC UNI RIGHT (MS)  01/06/2020   IR TRANSCATH/EMBOLIZ  01/06/2020   RADIOLOGY WITH ANESTHESIA N/A 01/06/2020   Procedure: IR WITH ANESTHESIA FOR ANEURYSM;  Surgeon: Consuella Lose, MD;  Location: [place];  Service: Radiology;  Laterality: N/A;   TUBAL LIGATION      There were no vitals filed for this visit.   Subjective Assessment - 10/19/20 0846     Subjective  Pt reports she will have to have another surgery for her aneursym potentially Sept of this year.    Pertinent History ICH 01/06/20, Aneurysm. PMH: OA bilateral knees and hands    Patient Stated Goals Pt would like to be as independent as possible.    Currently in Pain? No/denies    Pain Score 0-No pain            Applied moist heat to right shoulder, forearm, wrist and hand for 5 mins prior to therapeutic exercises to decrease pain and increase ROM. No pain at rest but 3/10 with movement.   Therapeutic Exercises: AAROM for scapular elevation, retraction, shoulder rolls and upwards rotation followed by PROM of right shoulder within pain tolerance, flexion to 90 degrees, ABD, elbow flexion/extension, forearm supination/pronation, wrist flexion/extension, digit flexion/extension.  AAROM for same movement patterns with facilitory techniques.     Grasp and release patterns with right hand to pick up and release objects,   Ball pegs utilized this date, difficulty with turning items, cues for grasp and release as well as attempt to isolate finger movements for manipulation  Response to tx:   Pt continues to demonstrate impairments in ROM, strength and coordination with use of right hand.  Recent injections of Dysport similar to Botox to help with management of spasticity.  Encouraged patient to work daily on motion at home and use of hand for as many tasks as possible. Decreased spasticity present in right hand but still evident in ring and small finger with use.  Continue to work towards  goals in plan of care to improve right hand functional use for daily tasks.                      OT Education - 10/19/20 0846     Education Details RUE functioning, ROM, grasp/release    Person(s) Educated Patient    Methods Explanation;Demonstration;Verbal cues    Comprehension Verbalized understanding;Returned demonstration;Verbal cues required              OT Short Term Goals - 09/12/20 1621       OT SHORT TERM GOAL #4   Title Pt will demonstrate 60* A/ROM shoulder flexion for RUE with pain less than or equal to 4/10.    Baseline Pt. presents with limited isolated  right shoulder flexion with pain continues to be 2. 08/08/20: 40* AROM with 3/10 pain    Time 4    Status Deferred      OT SHORT TERM GOAL #5   Title Pt will demonstrate improved LUE fine motor coordination for ADLs as evidenced by decreasing 9 hole peg test score to 25 secs or less.    Baseline Left FMC 41 sec visa the 9 hole peg test. 08/08/20: 32 seconds via 9 hole peg    Time 6    Period Weeks    Status On-going    Target Date 12/05/20      OT SHORT TERM GOAL #7   Title Pt will demonstrate at least 25% finger flexion/ extension in RUE in prep for functional use.    Baseline Limited PROM, and active digit flexion, and extension. Improved AROM for right 2nd, and 3rd digits. significant limitations with the 4th, and 5th digits. 08/08/20: AROM digit MCP 2nd (80*), 3rd (85*), 4th (80*), 5th (90*)    Time 4    Period Weeks    Status Deferred               OT Long Term Goals - 09/21/20 1423       OT LONG TERM GOAL #1   Title Pt will perform all basic ADLs with supervision    Baseline Pt. requires minA from her husband following multiple recent surgeries. Pt. requires minA from her husband. 08/08/20: Pt continues to require MIN A for grooming and dressing tasks.    Time 12    Period Weeks    Status On-going    Target Date 12/05/20      OT LONG TERM GOAL #2   Title Pt will demonstrate  60* RUE shoulder flexion in prep for functional reach with pain less than or equal to 3/10.    Baseline Pt. continues to present with limited AROM for isolated  right shoulder flexion with pain 2 . 08/08/20:  R AROM Shoulder flexion - 45* c 3/10 pain.    Time 12    Period Weeks    Status On-going    Target Date 12/05/20      OT LONG TERM GOAL #3   Title Pt will use RUE as a stabilizer/ gross A at least 30 % of the time for ADLs/ IADLs.    Baseline Pt. is using her right hand as a gross stabilizer 15% of the time. 08/08/20: requires MAX cues    Time 12    Period Weeks    Status On-going    Target Date 12/05/20      OT LONG TERM GOAL #4   Title Pt will increase LUE grip strength to 25 lbs or greater for increased ease with ADLs.    Baseline Pt. conitnues to present with limited left grip. Pt. has improved with left grip strength. Pt.continues to work towards 25#. 08/08/20: L grip 19#    Time 12    Period Weeks    Status On-going    Target Date 12/05/20      OT LONG TERM GOAL #5   Title Pt will perform simple home management/ snack prep with min A.    Baseline Pt. assists with filling the dishwasher, and performing laundry. Pt. continues to try to help when she can, however her husband mostly performs these tasks. 08/08/20: Husband performs    Time 12    Period Weeks    Status On-going    Target Date 12/05/20      OT LONG TERM GOAL #6   Title Pt will demonstrate ability to grasp/ release a cup 2/3 trials with RUE.    Baseline Pt. continues to initiate grasp, and active release of objects, however is unable to grasp/release a cup. 08/08/20: Able to grasp, unable to release x1 trial    Time 12    Period Weeks    Status On-going    Target Date 12/05/20      OT LONG TERM GOAL #8   Title I with updated HEP.      OT LONG TERM GOAL  #9   TITLE Pt. will use her right hand to assist the left hand in searching for items in, and holding her purse.    Baseline Pt requires MIN cues to  continually use RUE to hold purse.    Time 12    Period Weeks    Status On-going    Target Date 12/05/20      OT LONG TERM GOAL  #10   TITLE Pt. will use her right hand to assist the left to independently place eye glasses into a case.    Baseline Pt. has difficulty performing. 08/08/20: cues to incorporate RUE    Time 12    Period Weeks    Status Partially Met    Target Date 12/05/20                   Plan - 10/19/20 0847     Clinical Impression Statement Pt continues to demonstrate impairments in ROM, strength and coordination with use of right hand.  Recent injections of Dysport similar to Botox to help with management of spasticity.  Encouraged patient to work daily on motion at home and use of hand for as many tasks as possible. Decreased spasticity present in right hand but still evident in ring and small finger with use.  Continue to work towards goals in plan of care to improve right hand  functional use for daily tasks.    OT Occupational Profile and History Detailed Assessment- Review of Records and additional review of physical, cognitive, psychosocial history related to current functional performance    Occupational performance deficits (Please refer to evaluation for details): ADL's;IADL's;Rest and Sleep;Work;Play;Leisure    Body Structure / Function / Physical Skills ADL;Balance;Coordination;Decreased knowledge of precautions;Decreased knowledge of use of DME;Dexterity;Edema;Mobility;Tone;Strength;IADL;Sensation;GMC;Gait;ROM;FMC;Flexibility;Pain;Vision;UE functional use;Endurance    Cognitive Skills Attention;Perception;Problem Solve;Safety Awareness;Thought    Rehab Potential Good    Clinical Decision Making Several treatment options, min-mod task modification necessary    Comorbidities Affecting Occupational Performance: None    Modification or Assistance to Complete Evaluation  Min-Moderate modification of tasks or assist with assess necessary to complete eval    OT  Frequency 3x / week    OT Duration 12 weeks    OT Treatment/Interventions Self-care/ADL training;Ultrasound;Energy conservation;Visual/perceptual remediation/compensation;Patient/family education;DME and/or AE instruction;Aquatic Therapy;Paraffin;Gait Training;Passive range of motion;Balance training;Fluidtherapy;Electrical Stimulation;Functional Mobility Training;Splinting;Moist Heat;Therapeutic exercise;Manual Therapy;Cognitive remediation/compensation;Manual lymph drainage;Neuromuscular education;Coping strategies training    Consulted and Agree with Plan of Care Patient             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, unspecified laterality (HCC)    Problem List Patient Active Problem List   Diagnosis Date Noted   Dyspareunia in female 04/05/2020   History of abnormal cervical Pap smear 03/10/2020   GIB (gastrointestinal bleeding) 02/28/2020   Elevated BUN    Prediabetes    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   Cerebral vasospasm    Sinus tachycardia    Dysphagia, post-stroke    Thrombocytopenia (HCC)    Acute blood loss anemia    Brain aneurysm    ICH (intracerebral hemorrhage) ([place]) 01/05/2020   History of adenomatous polyp of colon 02/07/2016   Anatomical narrow angle, bilateral 12/14/2014   Fissure in ano 11/11/2014   Hemorrhoids 11/11/2014   Constipation 11/19/2013    GERD (gastroesophageal reflux disease) 03/24/2013   Irritable bowel syndrome with diarrhea 03/24/2013   Herpes zoster 05/14/2005   Abnormal Pap smear of cervix 05/15/1983   History of cervical dysplasia 05/15/1983   Amy T Lovett, OTR/L, CLT  Lovett,Amy 10/20/2020, 8:59 AM  [place], [place], [address] Phone: [phone]   Fax:  [phone]  Name: Erika Stanton MRN: [id number] Date of Birth: July 15, 1955

## 2020-10-20 NOTE — Therapy (Signed)
[facility] SERVICES [address] [place], [place], [address] Phone: [phone]   Fax:  [phone]  Occupational Therapy Treatment  Patient Details  Name: Erika Stanton MRN: [id number] Date of Birth: 1955-06-21 Referring Provider (OT): Dr. Naaman Plummer   Encounter Date: 10/19/2020   OT End of Session - 10/20/20 0916     Visit Number 19    Number of Visits 65    Date for OT Re-Evaluation 12/05/20    Authorization Type Progress report period starting 09/21/2020    Authorization Time [place]    OT Start Time 1015    OT Stop Time 1100    OT Time Calculation (min) 45 min    Activity Tolerance Patient tolerated treatment well    Behavior During Therapy [facility] for tasks assessed/performed             Past Medical History:  Diagnosis Date   Aneurysm ([place])    [place]     Past Surgical History:  Procedure Laterality Date   CHOLECYSTECTOMY     COLONOSCOPY  11/2017   at [facility]. no recurrent polyps.  suggest repeat surveillance study 11/2022.     COLONOSCOPY W/ POLYPECTOMY  06/2014   Dr Roxy Manns at [facility].  3 adenomatoous polyps, anal fissure.     ESOPHAGOGASTRODUODENOSCOPY (EGD) WITH PROPOFOL N/A 01/27/2020   Procedure: ESOPHAGOGASTRODUODENOSCOPY (EGD) WITH PROPOFOL;  Surgeon: Mauri Pole, MD;  Location: [facility];  Service: Endoscopy;  Laterality: N/A;   IR 3D INDEPENDENT WKST  01/06/2020   IR ANGIO INTRA EXTRACRAN SEL INTERNAL CAROTID BILAT MOD SED  01/06/2020   IR ANGIO VERTEBRAL SEL VERTEBRAL UNI L MOD SED  01/06/2020   IR ANGIOGRAM FOLLOW UP STUDY  01/06/2020   IR ANGIOGRAM FOLLOW UP STUDY  01/06/2020   IR ANGIOGRAM FOLLOW UP STUDY  01/06/2020   IR ANGIOGRAM FOLLOW UP STUDY  01/06/2020   IR ANGIOGRAM FOLLOW UP STUDY  01/06/2020   IR ANGIOGRAM FOLLOW UP STUDY  01/06/2020   IR ANGIOGRAM FOLLOW UP STUDY  01/06/2020   IR ANGIOGRAM FOLLOW UP STUDY  01/06/2020   IR ANGIOGRAM FOLLOW UP STUDY  01/06/2020   IR ANGIOGRAM FOLLOW UP  STUDY  01/06/2020   IR NEURO EACH ADD'L AFTER BASIC UNI RIGHT (MS)  01/06/2020   IR TRANSCATH/EMBOLIZ  01/06/2020   RADIOLOGY WITH ANESTHESIA N/A 01/06/2020   Procedure: IR WITH ANESTHESIA FOR ANEURYSM;  Surgeon: Consuella Lose, MD;  Location: [place];  Service: Radiology;  Laterality: N/A;   TUBAL LIGATION      There were no vitals filed for this visit.   Subjective Assessment - 10/20/20 0905     Subjective  Pt reports she and her husband would like to find a place to retire at the beach or potentially in [place] but they are concerned about being close to a major medical facility with her current medical isues.    Pertinent History ICH 01/06/20, Aneurysm. PMH: OA bilateral knees and hands    Patient Stated Goals Pt would like to be as independent as possible.    Currently in Pain? No/denies    Pain Score 0-No pain             No pain reported at rest but has pain with movement of shoulder past 90 degrees of shoulder flexion or ABD  Applied moist heat to right shoulder, forearm, wrist and hand for 5 mins prior to therapeutic exercises to decrease pain and increase ROM. Pain 3/10  with movement.   Therapeutic Exercises: AAROM for scapular elevation, retraction, shoulder rolls and upwards rotation followed by PROM of right shoulder within pain tolerance, flexion to 90 degrees, ABD, elbow flexion/extension, forearm supination/pronation, wrist flexion/extension, digit flexion/extension.   AAROM for same movement patterns with facilitory techniques.    Tabletop ROM with use of pillowcase under her right arm, forward reaching, ABD/ADD with guiding initially from therapist and then without as exercise is graded    Grasp and release patterns with right hand to pick up and release objects,  Resistive velcro squares utilized this date, with difficulty pulling item from board and maintaining prehension patterns. cues for grasp and release as well as attempt to isolate finger movements for  manipulation   Response to tx:   Pt continues to work towards improving right ROM and facilitation of movement in conjunction with her recent Dysport injections to help with management of spasticity and normalization of tone. Continue to encourage patient to work towards as much active movement and use as possible especially incorporating arm into self care and self feeding tasks as much as possible.  Husband continues to work with her on ROM and exercises at home.  Hand presenting with slightly decreased spasticity since injections.                     OT Education - 10/20/20 0915     Education Details RUE functioning, ROM, grasp/release    Person(s) Educated Patient    Methods Explanation;Demonstration;Verbal cues    Comprehension Verbalized understanding;Returned demonstration;Verbal cues required              OT Short Term Goals - 09/12/20 1621       OT SHORT TERM GOAL #4   Title Pt will demonstrate 60* A/ROM shoulder flexion for RUE with pain less than or equal to 4/10.    Baseline Pt. presents with limited isolated  right shoulder flexion with pain continues to be 2. 08/08/20: 40* AROM with 3/10 pain    Time 4    Status Deferred      OT SHORT TERM GOAL #5   Title Pt will demonstrate improved LUE fine motor coordination for ADLs as evidenced by decreasing 9 hole peg test score to 25 secs or less.    Baseline Left FMC 41 sec visa the 9 hole peg test. 08/08/20: 32 seconds via 9 hole peg    Time 6    Period Weeks    Status On-going    Target Date 12/05/20      OT SHORT TERM GOAL #7   Title Pt will demonstrate at least 25% finger flexion/ extension in RUE in prep for functional use.    Baseline Limited PROM, and active digit flexion, and extension. Improved AROM for right 2nd, and 3rd digits. significant limitations with the 4th, and 5th digits. 08/08/20: AROM digit MCP 2nd (80*), 3rd (85*), 4th (80*), 5th (90*)    Time 4    Period Weeks    Status Deferred                OT Long Term Goals - 09/21/20 1423       OT LONG TERM GOAL #1   Title Pt will perform all basic ADLs with supervision    Baseline Pt. requires minA from her husband following multiple recent surgeries. Pt. requires minA from her husband. 08/08/20: Pt continues to require MIN A for grooming and dressing tasks.    Time 12  Period Weeks    Status On-going    Target Date 12/05/20      OT LONG TERM GOAL #2   Title Pt will demonstrate 60* RUE shoulder flexion in prep for functional reach with pain less than or equal to 3/10.    Baseline Pt. continues to present with limited AROM for isolated  right shoulder flexion with pain 2 . 08/08/20: R AROM Shoulder flexion - 45* c 3/10 pain.    Time 12    Period Weeks    Status On-going    Target Date 12/05/20      OT LONG TERM GOAL #3   Title Pt will use RUE as a stabilizer/ gross A at least 30 % of the time for ADLs/ IADLs.    Baseline Pt. is using her right hand as a gross stabilizer 15% of the time. 08/08/20: requires MAX cues    Time 12    Period Weeks    Status On-going    Target Date 12/05/20      OT LONG TERM GOAL #4   Title Pt will increase LUE grip strength to 25 lbs or greater for increased ease with ADLs.    Baseline Pt. conitnues to present with limited left grip. Pt. has improved with left grip strength. Pt.continues to work towards 25#. 08/08/20: L grip 19#    Time 12    Period Weeks    Status On-going    Target Date 12/05/20      OT LONG TERM GOAL #5   Title Pt will perform simple home management/ snack prep with min A.    Baseline Pt. assists with filling the dishwasher, and performing laundry. Pt. continues to try to help when she can, however her husband mostly performs these tasks. 08/08/20: Husband performs    Time 12    Period Weeks    Status On-going    Target Date 12/05/20      OT LONG TERM GOAL #6   Title Pt will demonstrate ability to grasp/ release a cup 2/3 trials with RUE.    Baseline Pt.  continues to initiate grasp, and active release of objects, however is unable to grasp/release a cup. 08/08/20: Able to grasp, unable to release x1 trial    Time 12    Period Weeks    Status On-going    Target Date 12/05/20      OT LONG TERM GOAL #8   Title I with updated HEP.      OT LONG TERM GOAL  #9   TITLE Pt. will use her right hand to assist the left hand in searching for items in, and holding her purse.    Baseline Pt requires MIN cues to continually use RUE to hold purse.    Time 12    Period Weeks    Status On-going    Target Date 12/05/20      OT LONG TERM GOAL  #10   TITLE Pt. will use her right hand to assist the left to independently place eye glasses into a case.    Baseline Pt. has difficulty performing. 08/08/20: cues to incorporate RUE    Time 12    Period Weeks    Status Partially Met    Target Date 12/05/20                   Plan - 10/20/20 0916     Clinical Impression Statement Pt continues to work towards improving right ROM and facilitation of  movement in conjunction with her recent Dysport injections to help with management of spasticity and normalization of tone. Continue to encourage patient to work towards as much active movement and use as possible especially incorporating arm into self care and self feeding tasks as much as possible.  Husband continues to work with her on ROM and exercises at home.  Hand presenting with slightly decreased spasticity since injections.    OT Occupational Profile and History Detailed Assessment- Review of Records and additional review of physical, cognitive, psychosocial history related to current functional performance    Occupational performance deficits (Please refer to evaluation for details): ADL's;IADL's;Rest and Sleep;Work;Play;Leisure    Body Structure / Function / Physical Skills ADL;Balance;Coordination;Decreased knowledge of precautions;Decreased knowledge of use of  DME;Dexterity;Edema;Mobility;Tone;Strength;IADL;Sensation;GMC;Gait;ROM;FMC;Flexibility;Pain;Vision;UE functional use;Endurance    Cognitive Skills Attention;Perception;Problem Solve;Safety Awareness;Thought    Rehab Potential Good    Clinical Decision Making Several treatment options, min-mod task modification necessary    Comorbidities Affecting Occupational Performance: None    Modification or Assistance to Complete Evaluation  Min-Moderate modification of tasks or assist with assess necessary to complete eval    OT Frequency 3x / week    OT Duration 12 weeks    OT Treatment/Interventions Self-care/ADL training;Ultrasound;Energy conservation;Visual/perceptual remediation/compensation;Patient/family education;DME and/or AE instruction;Aquatic Therapy;Paraffin;Gait Training;Passive range of motion;Balance training;Fluidtherapy;Electrical Stimulation;Functional Mobility Training;Splinting;Moist Heat;Therapeutic exercise;Manual Therapy;Cognitive remediation/compensation;Manual lymph drainage;Neuromuscular education;Coping strategies training    Consulted and Agree with Plan of Care Patient             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, unspecified laterality (HCC)    Problem List Patient Active Problem List   Diagnosis Date Noted   Dyspareunia in female 04/05/2020   History of abnormal cervical  Pap smear 03/10/2020   GIB (gastrointestinal bleeding) 02/28/2020   Elevated BUN    Prediabetes    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   Cerebral vasospasm    Sinus tachycardia    Dysphagia, post-stroke    Thrombocytopenia (HCC)    Acute blood loss anemia    Brain aneurysm    ICH (intracerebral hemorrhage) ([place]) 01/05/2020   History of adenomatous polyp of colon 02/07/2016   Anatomical narrow angle, bilateral 12/14/2014   Fissure in ano 11/11/2014   Hemorrhoids 11/11/2014   Constipation 11/19/2013   GERD (gastroesophageal reflux disease) 03/24/2013   Irritable bowel syndrome with diarrhea 03/24/2013   Herpes zoster 05/14/2005   Abnormal Pap smear of cervix 05/15/1983   History of cervical dysplasia 05/15/1983   Ariyon Gerstenberger T Emmerson Taddei, OTR/L, CLT  Trinika Cortese 10/20/2020, 9:29 AM  [place], [place], [address] Phone: [phone]   Fax:  [phone]  Name: Erika Stanton MRN: [id number] Date of Birth: 01-May-1956

## 2020-10-21 ENCOUNTER — Other Ambulatory Visit: Payer: Self-pay

## 2020-10-21 ENCOUNTER — Ambulatory Visit: Payer: BC Managed Care – PPO | Admitting: Occupational Therapy

## 2020-10-21 ENCOUNTER — Encounter: Payer: Self-pay | Admitting: Occupational Therapy

## 2020-10-21 DIAGNOSIS — M6281 Muscle weakness (generalized): Secondary | ICD-10-CM

## 2020-10-21 DIAGNOSIS — R278 Other lack of coordination: Secondary | ICD-10-CM

## 2020-10-21 NOTE — Therapy (Signed)
[place] MAIN [facility] SERVICES [address] [place], [place], [address] Phone: [phone]   Fax:  [phone]  Occupational Therapy Treatment  Patient Details  Name: Erika Stanton MRN: [id number] Date of Birth: [birth year] Referring Provider (OT): Dr. Naaman Plummer   Encounter Date: 10/21/2020   OT End of Session - 10/21/20 1020     Visit Number [id number]    Number of Visits 36    Date for OT Re-Evaluation 12/05/20    Authorization Type Progress report period starting 09/21/2020    Authorization Time Period State [employer]    OT Start Time 1012    OT Stop Time 1057    OT Time Calculation (min) 45 min    Activity Tolerance Patient tolerated treatment well    Behavior During Therapy [facility] for tasks assessed/performed             Past Medical History:  Diagnosis Date   Aneurysm ([place])    Melena     Past Surgical History:  Procedure Laterality Date   CHOLECYSTECTOMY     COLONOSCOPY  11/2017   at [facility]. no recurrent polyps.  suggest repeat surveillance study 11/2022.     COLONOSCOPY W/ POLYPECTOMY  06/2014   Dr Roxy Manns at [facility].  3 adenomatoous polyps, anal fissure.     ESOPHAGOGASTRODUODENOSCOPY (EGD) WITH PROPOFOL N/A 01/27/2020   Procedure: ESOPHAGOGASTRODUODENOSCOPY (EGD) WITH PROPOFOL;  Surgeon: Mauri Pole, MD;  Location: [place] [facility]COPY;  Service: Endoscopy;  Laterality: N/A;   IR 3D INDEPENDENT WKST  01/06/2020   IR ANGIO INTRA EXTRACRAN SEL INTERNAL CAROTID BILAT MOD SED  01/06/2020   IR ANGIO VERTEBRAL SEL VERTEBRAL UNI L MOD SED  01/06/2020   IR ANGIOGRAM FOLLOW UP STUDY  01/06/2020   IR ANGIOGRAM FOLLOW UP STUDY  01/06/2020   IR ANGIOGRAM FOLLOW UP STUDY  01/06/2020   IR ANGIOGRAM FOLLOW UP STUDY  01/06/2020   IR ANGIOGRAM FOLLOW UP STUDY  01/06/2020   IR ANGIOGRAM FOLLOW UP STUDY  01/06/2020   IR ANGIOGRAM FOLLOW UP STUDY  01/06/2020   IR ANGIOGRAM FOLLOW UP STUDY  01/06/2020   IR ANGIOGRAM FOLLOW UP STUDY  01/06/2020   IR ANGIOGRAM FOLLOW UP  STUDY  01/06/2020   IR NEURO EACH ADD'L AFTER BASIC UNI RIGHT (MS)  01/06/2020   IR TRANSCATH/EMBOLIZ  01/06/2020   RADIOLOGY WITH ANESTHESIA N/A 01/06/2020   Procedure: IR WITH ANESTHESIA FOR ANEURYSM;  Surgeon: Consuella Lose, MD;  Location: [place];  Service: Radiology;  Laterality: N/A;   TUBAL LIGATION      There were no vitals filed for this visit.   Subjective Assessment - 10/21/20 1019     Subjective  Pt reports improved pain except for with multiple reps of AROM    Pertinent History ICH 01/06/20, Aneurysm. PMH: OA bilateral knees and hands    Repetition Decreases Symptoms    Patient Stated Goals Pt would like to be as independent as possible.    Currently in Pain? Yes    Pain Score 1     Pain Location Hand    Pain Orientation Right    Pain Descriptors / Indicators Aching    Pain Type Chronic pain    Pain Onset More than a month ago    Pain Frequency Intermittent    Pain Onset More than a month ago               Therapeutic Exercise Applied moist heat to right shoulder, forearm, wrist and hand  for 5 mins prior to therapeutic exercises to decrease pain and increase ROM. Pain 3/10 with movement. Pt tolerated 1 set x 20 reps seated RUE AAROM supination/pronation, wrist ext/flex, digit adduction/abduction, shoulder flexion/abduction, and elbow flex/ext. Pt instructed in updated HEP to improve anterior deltoid strength. Pt worked on the UE UBE in the forward motion for 4 min in standing. Constant attendance was provided for hand position and cues to avoid compensation proximally. Pt required assist x3 to adjust R grip.    Self Care Pt completed combing using RUE with assist from LUE to position comb - reaches front 1/4 of L and R side of head. MOD A from therapist for R shoulder elevation and wrist extension to reach middle 1/2 of L and R side.                 OT Education - 10/21/20 1020     Education Details RUE functioning, ROM, grasp/release    Person(s)  Educated Patient    Methods Explanation;Demonstration;Verbal cues    Comprehension Verbalized understanding;Returned demonstration;Verbal cues required              OT Short Term Goals - 09/12/20 1621       OT SHORT TERM GOAL #4   Title Pt will demonstrate 60* A/ROM shoulder flexion for RUE with pain less than or equal to 4/10.    Baseline Pt. presents with limited isolated  right shoulder flexion with pain continues to be 2. 08/08/20: 40* AROM with 3/10 pain    Time 4    Status Deferred      OT SHORT TERM GOAL #5   Title Pt will demonstrate improved LUE fine motor coordination for ADLs as evidenced by decreasing 9 hole peg test score to 25 secs or less.    Baseline Left FMC 41 sec visa the 9 hole peg test. 08/08/20: 32 seconds via 9 hole peg    Time 6    Period Weeks    Status On-going    Target Date 12/05/20      OT SHORT TERM GOAL #7   Title Pt will demonstrate at least 25% finger flexion/ extension in RUE in prep for functional use.    Baseline Limited PROM, and active digit flexion, and extension. Improved AROM for right 2nd, and 3rd digits. significant limitations with the 4th, and 5th digits. 08/08/20: AROM digit MCP 2nd (80*), 3rd (85*), 4th (80*), 5th (90*)    Time 4    Period Weeks    Status Deferred               OT Long Term Goals - 09/21/20 1423       OT LONG TERM GOAL #1   Title Pt will perform all basic ADLs with supervision    Baseline Pt. requires minA from her husband following multiple recent surgeries. Pt. requires minA from her husband. 08/08/20: Pt continues to require MIN A for grooming and dressing tasks.    Time 12    Period Weeks    Status On-going    Target Date 12/05/20      OT LONG TERM GOAL #2   Title Pt will demonstrate 60* RUE shoulder flexion in prep for functional reach with pain less than or equal to 3/10.    Baseline Pt. continues to present with limited AROM for isolated  right shoulder flexion with pain 2 . 08/08/20: R AROM  Shoulder flexion - 45* c 3/10 pain.    Time 12  Period Weeks    Status On-going    Target Date 12/05/20      OT LONG TERM GOAL #3   Title Pt will use RUE as a stabilizer/ gross A at least 30 % of the time for ADLs/ IADLs.    Baseline Pt. is using her right hand as a gross stabilizer 15% of the time. 08/08/20: requires MAX cues    Time 12    Period Weeks    Status On-going    Target Date 12/05/20      OT LONG TERM GOAL #4   Title Pt will increase LUE grip strength to 25 lbs or greater for increased ease with ADLs.    Baseline Pt. conitnues to present with limited left grip. Pt. has improved with left grip strength. Pt.continues to work towards 25#. 08/08/20: L grip 19#    Time 12    Period Weeks    Status On-going    Target Date 12/05/20      OT LONG TERM GOAL #5   Title Pt will perform simple home management/ snack prep with min A.    Baseline Pt. assists with filling the dishwasher, and performing laundry. Pt. continues to try to help when she can, however her husband mostly performs these tasks. 08/08/20: Husband performs    Time 12    Period Weeks    Status On-going    Target Date 12/05/20      OT LONG TERM GOAL #6   Title Pt will demonstrate ability to grasp/ release a cup 2/3 trials with RUE.    Baseline Pt. continues to initiate grasp, and active release of objects, however is unable to grasp/release a cup. 08/08/20: Able to grasp, unable to release x1 trial    Time 12    Period Weeks    Status On-going    Target Date 12/05/20      OT LONG TERM GOAL #8   Title I with updated HEP.      OT LONG TERM GOAL  #9   TITLE Pt. will use her right hand to assist the left hand in searching for items in, and holding her purse.    Baseline Pt requires MIN cues to continually use RUE to hold purse.    Time 12    Period Weeks    Status On-going    Target Date 12/05/20      OT LONG TERM GOAL  #10   TITLE Pt. will use her right hand to assist the left to independently place eye  glasses into a case.    Baseline Pt. has difficulty performing. 08/08/20: cues to incorporate RUE    Time 12    Period Weeks    Status Partially Met    Target Date 12/05/20                   Plan - 10/21/20 1022     Clinical Impression Statement Pt continues to imporve funcitonal grasp for ADL tasks. Pt agreeable to engage RUE during ADL tasks at home and HEP. Hand presenting with slightly decreased spasticity since injections. Pt continues to demonstrate spasticity and decreased AROM limiting independence with ADLs.    OT Occupational Profile and History Detailed Assessment- Review of Records and additional review of physical, cognitive, psychosocial history related to current functional performance    Occupational performance deficits (Please refer to evaluation for details): ADL's;IADL's;Rest and Sleep;Work;Play;Leisure    Body Structure / Function / Physical Skills ADL;Balance;Coordination;Decreased knowledge of precautions;Decreased  knowledge of use of DME;Dexterity;Edema;Mobility;Tone;Strength;IADL;Sensation;GMC;Gait;ROM;FMC;Flexibility;Pain;Vision;UE functional use;Endurance    Cognitive Skills Attention;Perception;Problem Solve;Safety Awareness;Thought    Rehab Potential Good    Clinical Decision Making Several treatment options, min-mod task modification necessary    Comorbidities Affecting Occupational Performance: None    Modification or Assistance to Complete Evaluation  Min-Moderate modification of tasks or assist with assess necessary to complete eval    OT Frequency 3x / week    OT Duration 12 weeks    OT Treatment/Interventions Self-care/ADL training;Ultrasound;Energy conservation;Visual/perceptual remediation/compensation;Patient/family education;DME and/or AE instruction;Aquatic Therapy;Paraffin;Gait Training;Passive range of motion;Balance training;Fluidtherapy;Electrical Stimulation;Functional Mobility Training;Splinting;Moist Heat;Therapeutic exercise;Manual  Therapy;Cognitive remediation/compensation;Manual lymph drainage;Neuromuscular education;Coping strategies training    Consulted and Agree with Plan of Care Patient             Patient will benefit from skilled therapeutic intervention in order to improve the following deficits and impairments:   Body Structure / Function / Physical Skills: ADL, Balance, Coordination, Decreased knowledge of precautions, Decreased knowledge of use of DME, Dexterity, Edema, Mobility, Tone, Strength, IADL, Sensation, GMC, Gait, ROM, FMC, Flexibility, Pain, Vision, UE functional use, Endurance Cognitive Skills: Attention, Perception, Problem Solve, Safety Awareness, Thought     Visit Diagnosis: Muscle weakness (generalized)  Other lack of coordination    Problem List Patient Active Problem List   Diagnosis Date Noted   Dyspareunia in female 04/05/2020   History of abnormal cervical Pap smear 03/10/2020   GIB (gastrointestinal bleeding) 02/28/2020   Elevated BUN    Prediabetes    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[place]) 01/20/2020   Cerebral vasospasm    Sinus tachycardia    Dysphagia, post-stroke    Thrombocytopenia ([facility])    Acute blood loss anemia    Brain aneurysm    ICH (intracerebral hemorrhage) ([place]) 01/05/2020   History of adenomatous polyp of colon 02/07/2016   Anatomical narrow angle, bilateral 12/14/2014   Fissure in ano 11/11/2014   Hemorrhoids 11/11/2014   Constipation 11/19/2013   GERD (gastroesophageal reflux disease) 03/24/2013   Irritable bowel syndrome with diarrhea 03/24/2013   Herpes zoster 05/14/2005   Abnormal Pap smear of cervix 05/15/1983   History of cervical dysplasia 05/15/1983    Dessie Coma, M.S. OTR/L  10/21/20, 11:21 AM  ascom [phone]   [place] MAIN [facility] SERVICES [address] [place], [place], [address] Phone: [phone]   Fax:  [phone]  Name: KEISHAWNA CARRANZA MRN: [id number] Date of Birth: 11-12-1955

## 2020-10-24 ENCOUNTER — Other Ambulatory Visit: Payer: Self-pay

## 2020-10-24 ENCOUNTER — Ambulatory Visit: Payer: BC Managed Care – PPO

## 2020-10-24 DIAGNOSIS — I69159 Hemiplegia and hemiparesis following nontraumatic intracerebral hemorrhage affecting unspecified side: Secondary | ICD-10-CM

## 2020-10-24 DIAGNOSIS — M6281 Muscle weakness (generalized): Secondary | ICD-10-CM | POA: Diagnosis not present

## 2020-10-24 DIAGNOSIS — R278 Other lack of coordination: Secondary | ICD-10-CM

## 2020-10-24 NOTE — Therapy (Signed)
[place] [facility] SERVICES [address] [place], [place], [address] Phone: [phone]   Fax:  [phone]  Occupational Therapy Treatment  Patient Details  Name: Erika Stanton MRN: [id number] Date of Birth: 1955-05-31 Referring Provider (OT): Dr. Naaman Plummer   Encounter Date: 10/24/2020   OT End of Session - 10/24/20 1130     Visit Number 37    Number of Visits 23    Date for OT Re-Evaluation 12/05/20    Authorization Type Progress report period starting 09/21/2020    Authorization Time [place]    OT Start Time 1015    OT Stop Time 1100    OT Time Calculation (min) 45 min    Activity Tolerance Patient tolerated treatment well    Behavior During Therapy [facility] for tasks assessed/performed             Past Medical History:  Diagnosis Date   Aneurysm ([place])    [place]     Past Surgical History:  Procedure Laterality Date   CHOLECYSTECTOMY     COLONOSCOPY  11/2017   at [facility]. no recurrent polyps.  suggest repeat surveillance study 11/2022.     COLONOSCOPY W/ POLYPECTOMY  06/2014   Dr Roxy Manns at [facility]  3 adenomatoous polyps, anal fissure.     ESOPHAGOGASTRODUODENOSCOPY (EGD) WITH PROPOFOL N/A 01/27/2020   Procedure: ESOPHAGOGASTRODUODENOSCOPY (EGD) WITH PROPOFOL;  Surgeon: Mauri Pole, MD;  Location: Between ENDOSCOPY;  Service: Endoscopy;  Laterality: N/A;   IR 3D INDEPENDENT WKST  01/06/2020   IR ANGIO INTRA EXTRACRAN SEL INTERNAL CAROTID BILAT MOD SED  01/06/2020   IR ANGIO VERTEBRAL SEL VERTEBRAL UNI L MOD SED  01/06/2020   IR ANGIOGRAM FOLLOW UP STUDY  01/06/2020   IR ANGIOGRAM FOLLOW UP STUDY  01/06/2020   IR ANGIOGRAM FOLLOW UP STUDY  01/06/2020   IR ANGIOGRAM FOLLOW UP STUDY  01/06/2020   IR ANGIOGRAM FOLLOW UP STUDY  01/06/2020   IR ANGIOGRAM FOLLOW UP STUDY  01/06/2020   IR ANGIOGRAM FOLLOW UP STUDY  01/06/2020   IR ANGIOGRAM FOLLOW UP STUDY  01/06/2020   IR ANGIOGRAM FOLLOW UP STUDY  01/06/2020   IR ANGIOGRAM FOLLOW UP  STUDY  01/06/2020   IR NEURO EACH ADD'L AFTER BASIC UNI RIGHT (MS)  01/06/2020   IR TRANSCATH/EMBOLIZ  01/06/2020   RADIOLOGY WITH ANESTHESIA N/A 01/06/2020   Procedure: IR WITH ANESTHESIA FOR ANEURYSM;  Surgeon: Consuella Lose, MD;  Location: [facility];  Service: Radiology;  Laterality: N/A;   TUBAL LIGATION      There were no vitals filed for this visit.   Subjective Assessment - 10/24/20 1121     Subjective  "At home I always try to use my arm as a tool."    Pertinent History ICH 01/06/20, Aneurysm. PMH: OA bilateral knees and hands    Repetition Decreases Symptoms    Patient Stated Goals Pt would like to be as independent as possible.    Currently in Pain? Yes    Pain Score 3     Pain Location Shoulder    Pain Orientation Right    Pain Descriptors / Indicators Aching    Pain Type Chronic pain    Pain Onset More than a month ago    Pain Frequency Other (Comment)   mostly with activity   Pain Score 3    Pain Location Hand    Pain Orientation Right    Pain Descriptors / Indicators Aching;Sore    Pain  Type Chronic pain    Pain Onset More than a month ago    Pain Frequency Other (Comment)   with activity and stretching            OT treatment:  Therapeutic exercise:  Applied heat to R shoulder, forearm and hand x5 min for pain reduction and muscle relaxation in prep for functional activity.  OT performed passive R scapular mobility including protraction/rectraction/elevation/depression, passive shoulder flexion 0-90, abd 0-80, ER with muscle energy technique to maximize end range, elbow flex/ext, forearm pron/sup, and wrist and digit flex/ext.  Instructed pt in self PROM techniques for R thumb abd and MCP digit extension with min vc for good return demo. Pt participated in therapeutic activity to utilize R hand as a stabilizer, working to open medication bottles and using R hand to place bottles back in storage box.  Pt struggled with child proof bottles, requiring mod a to open  these.  Otherwise able to use R hand as a fair stabilizer with extra time to open easy open bottles. Intermittent min vc for optimal grasp and position of hand/bottle on table top.  Rest breaks needed with passive digit extension to facilitate tone reduction.   Response to treatment: R sided hemiparesis and coordination deficits continue to limit pt's indep with self care/functional activity.  Pt requires cues to allow rest breaks, to focus on consistent stretching of RUE, and further attempts at using RUE as a stabilizer at home within her daily activities. Continued OT will benefit pt to maximize functional use of RUE.                      OT Education - 10/24/20 1127     Education Details OT reinforced importance of daily self passive stretching to R hand digits to reduce flexor tone and maximize opportunity for functional use.  Instructed in self passive stretch of R thumb abduction and flat hand on table to facilitate MCP extension.    Person(s) Educated Patient    Methods Explanation;Demonstration;Tactile cues;Verbal cues    Comprehension Verbalized understanding;Returned demonstration;Verbal cues required              OT Short Term Goals - 09/12/20 1621       OT SHORT TERM GOAL #4   Title Pt will demonstrate 60* A/ROM shoulder flexion for RUE with pain less than or equal to 4/10.    Baseline Pt. presents with limited isolated  right shoulder flexion with pain continues to be 2. 08/08/20: 40* AROM with 3/10 pain    Time 4    Status Deferred      OT SHORT TERM GOAL #5   Title Pt will demonstrate improved LUE fine motor coordination for ADLs as evidenced by decreasing 9 hole peg test score to 25 secs or less.    Baseline Left FMC 41 sec visa the 9 hole peg test. 08/08/20: 32 seconds via 9 hole peg    Time 6    Period Weeks    Status On-going    Target Date 12/05/20      OT SHORT TERM GOAL #7   Title Pt will demonstrate at least 25% finger flexion/ extension in  RUE in prep for functional use.    Baseline Limited PROM, and active digit flexion, and extension. Improved AROM for right 2nd, and 3rd digits. significant limitations with the 4th, and 5th digits. 08/08/20: AROM digit MCP 2nd (80*), 3rd (85*), 4th (80*), 5th (90*)    Time  4    Period Weeks    Status Deferred               OT Long Term Goals - 09/21/20 1423       OT LONG TERM GOAL #1   Title Pt will perform all basic ADLs with supervision    Baseline Pt. requires minA from her husband following multiple recent surgeries. Pt. requires minA from her husband. 08/08/20: Pt continues to require MIN A for grooming and dressing tasks.    Time 12    Period Weeks    Status On-going    Target Date 12/05/20      OT LONG TERM GOAL #2   Title Pt will demonstrate 60* RUE shoulder flexion in prep for functional reach with pain less than or equal to 3/10.    Baseline Pt. continues to present with limited AROM for isolated  right shoulder flexion with pain 2 . 08/08/20: R AROM Shoulder flexion - 45* c 3/10 pain.    Time 12    Period Weeks    Status On-going    Target Date 12/05/20      OT LONG TERM GOAL #3   Title Pt will use RUE as a stabilizer/ gross A at least 30 % of the time for ADLs/ IADLs.    Baseline Pt. is using her right hand as a gross stabilizer 15% of the time. 08/08/20: requires MAX cues    Time 12    Period Weeks    Status On-going    Target Date 12/05/20      OT LONG TERM GOAL #4   Title Pt will increase LUE grip strength to 25 lbs or greater for increased ease with ADLs.    Baseline Pt. conitnues to present with limited left grip. Pt. has improved with left grip strength. Pt.continues to work towards 25#. 08/08/20: L grip 19#    Time 12    Period Weeks    Status On-going    Target Date 12/05/20      OT LONG TERM GOAL #5   Title Pt will perform simple home management/ snack prep with min A.    Baseline Pt. assists with filling the dishwasher, and performing laundry. Pt.  continues to try to help when she can, however her husband mostly performs these tasks. 08/08/20: Husband performs    Time 12    Period Weeks    Status On-going    Target Date 12/05/20      OT LONG TERM GOAL #6   Title Pt will demonstrate ability to grasp/ release a cup 2/3 trials with RUE.    Baseline Pt. continues to initiate grasp, and active release of objects, however is unable to grasp/release a cup. 08/08/20: Able to grasp, unable to release x1 trial    Time 12    Period Weeks    Status On-going    Target Date 12/05/20      OT LONG TERM GOAL #8   Title I with updated HEP.      OT LONG TERM GOAL  #9   TITLE Pt. will use her right hand to assist the left hand in searching for items in, and holding her purse.    Baseline Pt requires MIN cues to continually use RUE to hold purse.    Time 12    Period Weeks    Status On-going    Target Date 12/05/20      OT LONG TERM GOAL  #10  TITLE Pt. will use her right hand to assist the left to independently place eye glasses into a case.    Baseline Pt. has difficulty performing. 08/08/20: cues to incorporate RUE    Time 12    Period Weeks    Status Partially Met    Target Date 12/05/20                   Plan - 10/24/20 1133     Clinical Impression Statement R sided hemiparesis and coordination deficits continue to limit pt's indep with self care/functional activity.  Pt requires cues to allow rest breaks, to focus on consistent stretching of RUE, and further attempts at using RUE as a stabilizer at home within her daily activities. Continued OT will benefit pt to maximize functional use of RUE.    OT Occupational Profile and History Detailed Assessment- Review of Records and additional review of physical, cognitive, psychosocial history related to current functional performance    Occupational performance deficits (Please refer to evaluation for details): ADL's;IADL's;Rest and Sleep;Work;Play;Leisure    Body Structure /  Function / Physical Skills ADL;Balance;Coordination;Decreased knowledge of precautions;Decreased knowledge of use of DME;Dexterity;Edema;Mobility;Tone;Strength;IADL;Sensation;GMC;Gait;ROM;FMC;Flexibility;Pain;Vision;UE functional use;Endurance    Cognitive Skills Attention;Perception;Problem Solve;Safety Awareness;Thought    Rehab Potential Good    Clinical Decision Making Several treatment options, min-mod task modification necessary    Comorbidities Affecting Occupational Performance: None    Modification or Assistance to Complete Evaluation  Min-Moderate modification of tasks or assist with assess necessary to complete eval    OT Frequency 3x / week    OT Duration 12 weeks    OT Treatment/Interventions Self-care/ADL training;Ultrasound;Energy conservation;Visual/perceptual remediation/compensation;Patient/family education;DME and/or AE instruction;Aquatic Therapy;Paraffin;Gait Training;Passive range of motion;Balance training;Fluidtherapy;Electrical Stimulation;Functional Mobility Training;Splinting;Moist Heat;Therapeutic exercise;Manual Therapy;Cognitive remediation/compensation;Manual lymph drainage;Neuromuscular education;Coping strategies training    Consulted and Agree with Plan of Care Patient             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, unspecified laterality (HCC)    Problem  List Patient Active Problem List   Diagnosis Date Noted   Dyspareunia in female 04/05/2020   History of abnormal cervical Pap smear 03/10/2020   GIB (gastrointestinal bleeding) 02/28/2020   Elevated BUN    Prediabetes    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   Cerebral vasospasm    Sinus tachycardia    Dysphagia, post-stroke    Thrombocytopenia (HCC)    Acute blood loss anemia    Brain aneurysm    ICH (intracerebral hemorrhage) ([place]) 01/05/2020   History of adenomatous polyp of colon 02/07/2016   Anatomical narrow angle, bilateral 12/14/2014   Fissure in ano 11/11/2014   Hemorrhoids 11/11/2014   Constipation 11/19/2013   GERD (gastroesophageal reflux disease) 03/24/2013   Irritable bowel syndrome with diarrhea 03/24/2013   Herpes zoster 05/14/2005   Abnormal Pap smear of cervix 05/15/1983   History of cervical dysplasia 05/15/1983    Darleene Cleaver, MS, OTR/L 10/24/2020, 11:52 AM  [facility] [place]  [place], [place], [address] Phone: [phone]   Fax:  [phone]  Name: Erika Stanton MRN: [id number] Date of Birth: April 05, 1956

## 2020-10-26 ENCOUNTER — Encounter: Payer: BC Managed Care – PPO | Admitting: Occupational Therapy

## 2020-10-28 ENCOUNTER — Encounter: Payer: BC Managed Care – PPO | Admitting: Occupational Therapy

## 2020-10-28 ENCOUNTER — Ambulatory Visit: Payer: BC Managed Care – PPO | Admitting: Physical Medicine and Rehabilitation

## 2020-10-31 ENCOUNTER — Other Ambulatory Visit: Payer: Self-pay

## 2020-10-31 ENCOUNTER — Encounter: Payer: Self-pay | Admitting: Occupational Therapy

## 2020-10-31 ENCOUNTER — Ambulatory Visit: Payer: BC Managed Care – PPO | Admitting: Occupational Therapy

## 2020-10-31 DIAGNOSIS — M6281 Muscle weakness (generalized): Secondary | ICD-10-CM | POA: Diagnosis not present

## 2020-10-31 DIAGNOSIS — R278 Other lack of coordination: Secondary | ICD-10-CM

## 2020-10-31 NOTE — Therapy (Signed)
[place] MAIN [facility] SERVICES [address] [place], [place], [address] Phone: [phone]   Fax:  [phone]  Patient Details  Name: Erika Stanton MRN: [id number] Date of Birth: 02-05-1956 Referring Provider (OT): Dr. Naaman Plummer Occupational Therapy Progress Note                                                                                         Dates of reporting period   09/21/2020  to  6/202/2022   Encounter Date: 10/31/2020   OT End of Session - 10/31/20 1025     Visit Number 1    Number of Visits 84    Date for OT Re-Evaluation 12/05/20    Authorization Type Progress report period starting 09/21/2020    OT Start Time 1015    OT Stop Time 1100    OT Time Calculation (min) 45 min    Activity Tolerance Patient tolerated treatment well    Behavior During Therapy [facility] for tasks assessed/performed             Past Medical History:  Diagnosis Date   Aneurysm ([place])    Melena     Past Surgical History:  Procedure Laterality Date   CHOLECYSTECTOMY     COLONOSCOPY  11/2017   at [facility]. no recurrent polyps.  suggest repeat surveillance study 11/2022.     COLONOSCOPY W/ POLYPECTOMY  06/2014   Dr Roxy Manns at [facility].  3 adenomatoous polyps, anal fissure.     ESOPHAGOGASTRODUODENOSCOPY (EGD) WITH PROPOFOL N/A 01/27/2020   Procedure: ESOPHAGOGASTRODUODENOSCOPY (EGD) WITH PROPOFOL;  Surgeon: Mauri Pole, MD;  Location: [facility];  Service: Endoscopy;  Laterality: N/A;   IR 3D INDEPENDENT WKST  01/06/2020   IR ANGIO INTRA EXTRACRAN SEL INTERNAL CAROTID BILAT MOD SED  01/06/2020   IR ANGIO VERTEBRAL SEL VERTEBRAL UNI L MOD SED  01/06/2020   IR ANGIOGRAM FOLLOW UP STUDY  01/06/2020   IR ANGIOGRAM FOLLOW UP STUDY  01/06/2020   IR ANGIOGRAM FOLLOW UP STUDY  01/06/2020   IR ANGIOGRAM FOLLOW UP STUDY  01/06/2020   IR ANGIOGRAM FOLLOW UP STUDY  01/06/2020   IR ANGIOGRAM FOLLOW UP STUDY  01/06/2020   IR ANGIOGRAM FOLLOW UP STUDY  01/06/2020   IR  ANGIOGRAM FOLLOW UP STUDY  01/06/2020   IR ANGIOGRAM FOLLOW UP STUDY  01/06/2020   IR ANGIOGRAM FOLLOW UP STUDY  01/06/2020   IR NEURO EACH ADD'L AFTER BASIC UNI RIGHT (MS)  01/06/2020   IR TRANSCATH/EMBOLIZ  01/06/2020   RADIOLOGY WITH ANESTHESIA N/A 01/06/2020   Procedure: IR WITH ANESTHESIA FOR ANEURYSM;  Surgeon: Consuella Lose, MD;  Location: [place];  Service: Radiology;  Laterality: N/A;   TUBAL LIGATION      There were no vitals filed for this visit.   Subjective Assessment - 10/31/20 1023     Subjective  "At home I always try to use my arm as a tool."    Patient is accompanied by: Family member    Repetition Decreases Symptoms    Currently in Pain? Yes    Pain Score 1     Pain Location Hand  Pain Orientation Right    Pain Descriptors / Indicators Aching    Pain Type Chronic pain                [facility] OT Assessment - 10/31/20 0001       Coordination   Left 9 Hole Peg Test 32      Hand Function   Left Hand Grip (lbs) 19    Left Hand Lateral Pinch 11 lbs    Left 3 point pinch 10 lbs            OT TREATMENT     Therapeutic Exercise:   Pt. was able to tolerate AAROM/PROM for shoulder flexion, abduction, elbow flexion, elbow extension,  PROM for right wrist, digit MP, PIP, and DIP flexion, and extension, AROM digit MP, PIP, and DIP flexion, extension.Thumb radial, and palmar abduction, Thumb IP flexion, and extension in preparation for grasping objects. Pt. Worked on active digit extension in preparation for functional grasping. PROM was performed following moist heat modality.   Measurements were obtained, and goals were reviewed with the pt. Pt. is making progress, and is now doing her own laundry, assisting with some home management tasks, and performing light meal preparation. Pt. has improved with donning her shirt, pants, and slide on shoes. Pt. Continues to have difficulty donning a bra, fastening zipper/button on pants, and buttoning buttons on her shirt.  Pt. Continues to present with flexor tone, and tightness in the right UE, and hand which limits her ability to perform functional reaching. Pt. Continues to work on improving, and normalizing tone in the RUE, and continues to work towards facilitating the necessary movements needed to engage in ADLs, and IADL tasks.                  OT Education - 10/31/20 1025     Education Details RUE ROM, functional grasping    Person(s) Educated Patient    Methods Explanation;Demonstration;Tactile cues;Verbal cues    Comprehension Verbalized understanding;Returned demonstration;Verbal cues required              OT Short Term Goals - 10/31/20 1057       OT SHORT TERM GOAL #5   Title Pt will demonstrate improved LUE fine motor coordination for ADLs as evidenced by decreasing 9 hole peg test score to 25 secs or less.    Baseline 10/31/2020: Left FMC: 32 sec. Left FMC 41 sec visa the 9 hole peg test. 08/08/20: 32 seconds via 9 hole peg    Time 6    Period Weeks    Status On-going    Target Date 12/05/20               OT Long Term Goals - 10/31/20 1027       OT LONG TERM GOAL #1   Title Pt will perform all basic ADLs with supervision    Baseline 6/202/2022: Pt. is able to donn stretchy shirts, donn slide on shoes, donning pants. Pt. has difficylty with managing a bra,shirt buttons, and pant zipper/button.Pt. requires minA from her husband following multiple recent surgeries. Pt. requires minA from her husband. 08/08/20: Pt continues to require MIN A for grooming and dressing tasks.    Time 12    Period Weeks    Status On-going    Target Date 12/05/20      OT LONG TERM GOAL #2   Title Pt will demonstrate 60* RUE shoulder flexion in prep for functional reach with pain less than or  equal to 3/10.    Baseline 10/31/20: Pt. continues to present with limited AROM for isolated  right shoulder flexion with pain. 08/08/20: R AROM Shoulder flexion - 45* c 3/10 pain.    Time 12    Period  Weeks    Status On-going    Target Date 12/05/20      OT LONG TERM GOAL #3   Title Pt will use RUE as a stabilizer/ gross A at least 30 % of the time for ADLs/ IADLs.    Baseline Pt. is using her right hand as a gross stabilizer 15% of the time. 08/08/20: requires MAX cues    Time 12    Period Weeks    Status On-going    Target Date 12/05/20      OT LONG TERM GOAL #4   Title Pt will increase LUE grip strength to 25 lbs or greater for increased ease with ADLs.    Baseline 10/31/2020: Pt. continues to present with limited left grip. Pt. has improved with left grip strength. Pt.continues to work towards 25#. 08/08/20: L grip 19#    Time 12    Period Weeks    Status On-going    Target Date 12/05/20      OT LONG TERM GOAL #5   Title 10/31/2020: Pt. is now perfroming laundry, loading the dishwasher, assists some with cooking, light meal preparation, setting the table    Baseline Pt. assists with filling the dishwasher, and performing laundry. Pt. continues to try to help when she can, however her husband mostly performs these tasks. 08/08/20: Husband performs    Time 12    Period Weeks    Status On-going    Target Date 12/05/20      OT LONG TERM GOAL #6   Title Pt will demonstrate ability to grasp/ release a cup 2/3 trials with RUE.    Baseline 10/31/2020: Pt. continues to initiate grasp, and active release of objects, however is unable to grasp/release a cup. 08/08/20: Able to grasp, unable to release x1 trial    Time 12    Period Weeks    Status On-going    Target Date 12/05/20      OT LONG TERM GOAL #8   Title I with updated HEP.    Baseline 09/12/2020: Pt. conitinues to require assist for her HEP. Pt. requires assist, and cuing for HEP. 08/08/20: reports completing with husband assist for technique    Time 12    Period Weeks    Status Partially Met    Target Date 12/05/20      OT LONG TERM GOAL  #9   TITLE Pt. will use her right hand to assist the left hand in searching for items  in, and holding her purse.    Baseline Independent    Time 12    Period Weeks    Status Achieved      OT LONG TERM GOAL  #10   TITLE Pt. will use her right hand to assist the left to independently place eye glasses into a case.    Baseline Independent    Time 12    Period Weeks    Status Achieved                   Plan - 10/31/20 1026     Clinical Impression Statement Measurements were obtained, and goals were reviewed with the pt. Pt. is making progress, and is now doing her own laundry, assisting with some home  management tasks, and performing light meal preparation. Pt. has improved with donning her shirt, pants, and slide on shoes. Pt. Continues to have difficulty donning a bra, fastening zipper/button on pants, and buttoning buttons on her shirt. Pt. Continues to present with flexor tone, and tightness in the right UE, and hand which limits her ability to perform functional reaching. Pt. Continues to work on improving, and normalizing tone in the RUE, and continues to work towards facilitating the necessary movements needed to engage in ADLs, and IADL tasks.            OT Occupational Profile and History Detailed Assessment- Review of Records and additional review of physical, cognitive, psychosocial history related to current functional performance    Occupational performance deficits (Please refer to evaluation for details): ADL's;IADL's;Rest and Sleep;Work;Play;Leisure    Body Structure / Function / Physical Skills ADL;Balance;Coordination;Decreased knowledge of precautions;Decreased knowledge of use of DME;Dexterity;Edema;Mobility;Tone;Strength;IADL;Sensation;GMC;Gait;ROM;FMC;Flexibility;Pain;Vision;UE functional use;Endurance    Cognitive Skills Attention;Perception;Problem Solve;Safety Awareness;Thought    Rehab Potential Good    Clinical Decision Making Several treatment options, min-mod task modification necessary    Comorbidities Affecting Occupational  Performance: None    Modification or Assistance to Complete Evaluation  Min-Moderate modification of tasks or assist with assess necessary to complete eval    OT Frequency 3x / week    OT Duration 12 weeks    OT Treatment/Interventions Self-care/ADL training;Ultrasound;Energy conservation;Visual/perceptual remediation/compensation;Patient/family education;DME and/or AE instruction;Aquatic Therapy;Paraffin;Gait Training;Passive range of motion;Balance training;Fluidtherapy;Electrical Stimulation;Functional Mobility Training;Splinting;Moist Heat;Therapeutic exercise;Manual Therapy;Cognitive remediation/compensation;Manual lymph drainage;Neuromuscular education;Coping strategies training    Plan Pt is transferring her care to [facility] as it is closer to home. She remains very fearful of moving RUE and responds well to heat, and estim.    Consulted and Agree with Plan of Care Patient    Family Member Consulted Spose present during the session.             Patient will benefit from skilled therapeutic intervention in order to improve the following deficits and impairments:   Body Structure / Function / Physical Skills: ADL, Balance, Coordination, Decreased knowledge of precautions, Decreased knowledge of use of DME, Dexterity, Edema, Mobility, Tone, Strength, IADL, Sensation, GMC, Gait, ROM, FMC, Flexibility, Pain, Vision, UE functional use, Endurance Cognitive Skills: Attention, Perception, Problem Solve, Safety Awareness, Thought     Visit Diagnosis: Muscle weakness (generalized)  Other lack of coordination    Problem List Patient Active Problem List   Diagnosis Date Noted   Dyspareunia in female 04/05/2020   History of abnormal cervical Pap smear 03/10/2020   GIB (gastrointestinal bleeding) 02/28/2020   Elevated BUN    Prediabetes    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   Cerebral vasospasm    Sinus tachycardia    Dysphagia, post-stroke    Thrombocytopenia ([facility])    Acute  blood loss anemia    Brain aneurysm    ICH (intracerebral hemorrhage) ([place]) 01/05/2020   History of adenomatous polyp of colon 02/07/2016   Anatomical narrow angle, bilateral 12/14/2014   Fissure in ano 11/11/2014   Hemorrhoids 11/11/2014   Constipation 11/19/2013   GERD (gastroesophageal reflux disease) 03/24/2013   Irritable bowel syndrome with diarrhea 03/24/2013   Herpes zoster 05/14/2005   Abnormal Pap smear of cervix 05/15/1983   History of cervical dysplasia 05/15/1983    Harrel Carina, MS, OTR/L 10/31/2020, 4:12 PM  [place], [place], [address] Phone: [phone]   Fax:  [phone]  Name:  DEOLINDA FRID MRN: [id number] Date of Birth: 06-27-1955

## 2020-11-02 ENCOUNTER — Encounter: Payer: BC Managed Care – PPO | Admitting: Occupational Therapy

## 2020-11-03 ENCOUNTER — Encounter: Payer: Self-pay | Admitting: Occupational Therapy

## 2020-11-03 ENCOUNTER — Other Ambulatory Visit: Payer: Self-pay

## 2020-11-03 ENCOUNTER — Ambulatory Visit: Payer: BC Managed Care – PPO | Admitting: Occupational Therapy

## 2020-11-03 DIAGNOSIS — R278 Other lack of coordination: Secondary | ICD-10-CM

## 2020-11-03 DIAGNOSIS — M6281 Muscle weakness (generalized): Secondary | ICD-10-CM | POA: Diagnosis not present

## 2020-11-03 NOTE — Therapy (Signed)
[place] MAIN [facility] SERVICES [address] [place], [place], [address] Phone: [phone]   Fax:  [phone]  Occupational Therapy Treatment  Patient Details  Name: Erika Stanton MRN: [id number] Date of Birth: 01-Nov-1955 Referring Provider (OT): Dr. Naaman Plummer   Encounter Date: 11/03/2020   OT End of Session - 11/03/20 1153     Visit Number [id number]    Number of Visits 22    Date for OT Re-Evaluation 12/05/20    Authorization Type Progress report period starting 09/21/2020    OT Start Time 1145    OT Stop Time 1230    OT Time Calculation (min) 45 min    Activity Tolerance Patient tolerated treatment well    Behavior During Therapy [facility] for tasks assessed/performed             Past Medical History:  Diagnosis Date   Aneurysm ([place])    Melena     Past Surgical History:  Procedure Laterality Date   CHOLECYSTECTOMY     COLONOSCOPY  11/2017   at [facility]. no recurrent polyps.  suggest repeat surveillance study 11/2022.     COLONOSCOPY W/ POLYPECTOMY  06/2014   Dr Roxy Manns at [facility].  3 adenomatoous polyps, anal fissure.     ESOPHAGOGASTRODUODENOSCOPY (EGD) WITH PROPOFOL N/A 01/27/2020   Procedure: ESOPHAGOGASTRODUODENOSCOPY (EGD) WITH PROPOFOL;  Surgeon: Mauri Pole, MD;  Location: [place] [facility];  Service: Endoscopy;  Laterality: N/A;   IR 3D INDEPENDENT WKST  01/06/2020   IR ANGIO INTRA EXTRACRAN SEL INTERNAL CAROTID BILAT MOD SED  01/06/2020   IR ANGIO VERTEBRAL SEL VERTEBRAL UNI L MOD SED  01/06/2020   IR ANGIOGRAM FOLLOW UP STUDY  01/06/2020   IR ANGIOGRAM FOLLOW UP STUDY  01/06/2020   IR ANGIOGRAM FOLLOW UP STUDY  01/06/2020   IR ANGIOGRAM FOLLOW UP STUDY  01/06/2020   IR ANGIOGRAM FOLLOW UP STUDY  01/06/2020   IR ANGIOGRAM FOLLOW UP STUDY  01/06/2020   IR ANGIOGRAM FOLLOW UP STUDY  01/06/2020   IR ANGIOGRAM FOLLOW UP STUDY  01/06/2020   IR ANGIOGRAM FOLLOW UP STUDY  01/06/2020   IR ANGIOGRAM FOLLOW UP STUDY  01/06/2020   IR NEURO EACH ADD'L  AFTER BASIC UNI RIGHT (MS)  01/06/2020   IR TRANSCATH/EMBOLIZ  01/06/2020   RADIOLOGY WITH ANESTHESIA N/A 01/06/2020   Procedure: IR WITH ANESTHESIA FOR ANEURYSM;  Surgeon: Consuella Lose, MD;  Location: [place];  Service: Radiology;  Laterality: N/A;   TUBAL LIGATION      There were no vitals filed for this visit.   Subjective Assessment - 11/03/20 1152     Subjective  Pt. reports having a follow-up MRI at the beginning of July.    Patient is accompanied by: Family member    Pertinent History ICH 01/06/20, Aneurysm. PMH: OA bilateral knees and hands    Patient Stated Goals Pt would like to be as independent as possible.    Currently in Pain? Yes    Pain Score 1     Pain Orientation Left    Pain Radiating Towards hand             OT TREATMENT     Therapeutic Exercise:   Pt. was able to tolerate AAROM/PROM for shoulder flexion, abduction, elbow flexion, elbow extension, AAROM supination PROM for right wrist, digit MP, PIP, and DIP flexion, and extension, AROM digit MP, PIP, and DIP flexion, extension.Thumb radial, and palmar abduction, Thumb IP flexion, and extension in preparation for grasping  objects. Pt. Worked on BUE strengthening, and reciprocal motion using the UBE while seated for 5 min. with no resistance. Constant monitoring was provided.   Neuromuscular re-ed:   Pt. worked on using her right hand for grasping [place] style discs from a tabletop surface, and reaching up to place them into a container placed at a tabletop surface. Emphasis was placed on extending, and opening the digits with her right hand with support proximally at her elbow.   Pt. continues to present with  1/10 pain in her right hand/thumb.  Pt. continues to tolerate moist heat prior to, and in conjunction with ROM. Pt. presented with increased thumb ROM without increased reports in pain. Pt. Tolerated ROM well today. Pt. continues to present with Improving active digit grasp, and release with her right  thumb, 2nd, and 3rd digits. Pt. continues to work on improving RUE ROM, and functional reaching in preparation for engaging her UE during ADLs, and IADL tasks. Pt. continues to work on improving RUE ROM, and functional hand use during ADLs, and IADL tasks.                       OT Education - 11/03/20 1153     Education Details RUE ROM, functional grasping    Person(s) Educated Patient    Methods Explanation;Demonstration;Tactile cues;Verbal cues    Comprehension Verbalized understanding;Returned demonstration;Verbal cues required              OT Short Term Goals - 10/31/20 1057       OT SHORT TERM GOAL #5   Title Pt will demonstrate improved LUE fine motor coordination for ADLs as evidenced by decreasing 9 hole peg test score to 25 secs or less.    Baseline 10/31/2020: Left FMC: 32 sec. Left FMC 41 sec visa the 9 hole peg test. 08/08/20: 32 seconds via 9 hole peg    Time 6    Period Weeks    Status On-going    Target Date 12/05/20               OT Long Term Goals - 10/31/20 1027       OT LONG TERM GOAL #1   Title Pt will perform all basic ADLs with supervision    Baseline 6/202/2022: Pt. is able to donn stretchy shirts, donn slide on shoes, donning pants. Pt. has difficylty with managing a bra,shirt buttons, and pant zipper/button.Pt. requires minA from her husband following multiple recent surgeries. Pt. requires minA from her husband. 08/08/20: Pt continues to require MIN A for grooming and dressing tasks.    Time 12    Period Weeks    Status On-going    Target Date 12/05/20      OT LONG TERM GOAL #2   Title Pt will demonstrate 60* RUE shoulder flexion in prep for functional reach with pain less than or equal to 3/10.    Baseline 10/31/20: Pt. continues to present with limited AROM for isolated  right shoulder flexion with pain. 08/08/20: R AROM Shoulder flexion - 45* c 3/10 pain.    Time 12    Period Weeks    Status On-going    Target Date 12/05/20       OT LONG TERM GOAL #3   Title Pt will use RUE as a stabilizer/ gross A at least 30 % of the time for ADLs/ IADLs.    Baseline Pt. is using her right hand as a gross stabilizer 15% of the  time. 08/08/20: requires MAX cues    Time 12    Period Weeks    Status On-going    Target Date 12/05/20      OT LONG TERM GOAL #4   Title Pt will increase LUE grip strength to 25 lbs or greater for increased ease with ADLs.    Baseline 10/31/2020: Pt. continues to present with limited left grip. Pt. has improved with left grip strength. Pt.continues to work towards 25#. 08/08/20: L grip 19#    Time 12    Period Weeks    Status On-going    Target Date 12/05/20      OT LONG TERM GOAL #5   Title 10/31/2020: Pt. is now perfroming laundry, loading the dishwasher, assists some with cooking, light meal preparation, setting the table    Baseline Pt. assists with filling the dishwasher, and performing laundry. Pt. continues to try to help when she can, however her husband mostly performs these tasks. 08/08/20: Husband performs    Time 12    Period Weeks    Status On-going    Target Date 12/05/20      OT LONG TERM GOAL #6   Title Pt will demonstrate ability to grasp/ release a cup 2/3 trials with RUE.    Baseline 10/31/2020: Pt. continues to initiate grasp, and active release of objects, however is unable to grasp/release a cup. 08/08/20: Able to grasp, unable to release x1 trial    Time 12    Period Weeks    Status On-going    Target Date 12/05/20      OT LONG TERM GOAL #8   Title I with updated HEP.    Baseline 09/12/2020: Pt. conitinues to require assist for her HEP. Pt. requires assist, and cuing for HEP. 08/08/20: reports completing with husband assist for technique    Time 12    Period Weeks    Status Partially Met    Target Date 12/05/20      OT LONG TERM GOAL  #9   TITLE Pt. will use her right hand to assist the left hand in searching for items in, and holding her purse.    Baseline Independent     Time 12    Period Weeks    Status Achieved      OT LONG TERM GOAL  #10   TITLE Pt. will use her right hand to assist the left to independently place eye glasses into a case.    Baseline Independent    Time 12    Period Weeks    Status Achieved                   Plan - 11/03/20 1154     Clinical Impression Statement Pt. continues to present with  1/10 pain in her right hand/thumb.  Pt. continues to tolerate moist heat prior to, and in conjunction with ROM. Pt. presented with increased thumb ROM without increased reports in pain. Pt. Tolerated ROM well today. Pt. continues to present with Improving active digit grasp, and release with her right thumb, 2nd, and 3rd digits. Pt. continues to work on improving RUE ROM, and functional reaching in preparation for engaging her UE during ADLs, and IADL tasks. Pt. continues to work on improving RUE ROM, and functional hand use during ADLs, and IADL tasks.      OT Occupational Profile and History Detailed Assessment- Review of Records and additional review of physical, cognitive, psychosocial history related to current functional performance  Occupational performance deficits (Please refer to evaluation for details): ADL's;IADL's;Rest and Sleep;Work;Play;Leisure    Body Structure / Function / Physical Skills ADL;Balance;Coordination;Decreased knowledge of precautions;Decreased knowledge of use of DME;Dexterity;Edema;Mobility;Tone;Strength;IADL;Sensation;GMC;Gait;ROM;FMC;Flexibility;Pain;Vision;UE functional use;Endurance    Cognitive Skills Attention;Perception;Problem Solve;Safety Awareness;Thought    Rehab Potential Good    Clinical Decision Making Several treatment options, min-mod task modification necessary    Comorbidities Affecting Occupational Performance: None    Modification or Assistance to Complete Evaluation  Min-Moderate modification of tasks or assist with assess necessary to complete eval    OT Frequency 3x / week    OT  Duration 12 weeks    OT Treatment/Interventions Self-care/ADL training;Ultrasound;Energy conservation;Visual/perceptual remediation/compensation;Patient/family education;DME and/or AE instruction;Aquatic Therapy;Paraffin;Gait Training;Passive range of motion;Balance training;Fluidtherapy;Electrical Stimulation;Functional Mobility Training;Splinting;Moist Heat;Therapeutic exercise;Manual Therapy;Cognitive remediation/compensation;Manual lymph drainage;Neuromuscular education;Coping strategies training    Consulted and Agree with Plan of Care Patient    Family Member Consulted Spose present during the session.             Patient will benefit from skilled therapeutic intervention in order to improve the following deficits and impairments:   Body Structure / Function / Physical Skills: ADL, Balance, Coordination, Decreased knowledge of precautions, Decreased knowledge of use of DME, Dexterity, Edema, Mobility, Tone, Strength, IADL, Sensation, GMC, Gait, ROM, FMC, Flexibility, Pain, Vision, UE functional use, Endurance Cognitive Skills: Attention, Perception, Problem Solve, Safety Awareness, Thought     Visit Diagnosis: Muscle weakness (generalized)  Other lack of coordination    Problem List Patient Active Problem List   Diagnosis Date Noted   Dyspareunia in female 04/05/2020   History of abnormal cervical Pap smear 03/10/2020   GIB (gastrointestinal bleeding) 02/28/2020   Elevated BUN    Prediabetes    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   Cerebral vasospasm    Sinus tachycardia    Dysphagia, post-stroke    Thrombocytopenia ([facility])    Acute blood loss anemia    Brain aneurysm    ICH (intracerebral hemorrhage) ([place]) 01/05/2020   History of adenomatous polyp of colon 02/07/2016   Anatomical narrow angle, bilateral 12/14/2014   Fissure in ano 11/11/2014   Hemorrhoids 11/11/2014   Constipation 11/19/2013   GERD (gastroesophageal reflux disease) 03/24/2013   Irritable  bowel syndrome with diarrhea 03/24/2013   Herpes zoster 05/14/2005   Abnormal Pap smear of cervix 05/15/1983   History of cervical dysplasia 05/15/1983    Harrel Carina, MS, OTR/L 11/03/2020, 1:17 PM  [place] [address] [place], [place], [address] Phone: [phone]   Fax:  [phone]  Name: Erika Stanton MRN: [id number] Date of Birth: 1955-08-18

## 2020-11-07 ENCOUNTER — Other Ambulatory Visit: Payer: Self-pay

## 2020-11-07 ENCOUNTER — Ambulatory Visit: Payer: BC Managed Care – PPO | Admitting: Occupational Therapy

## 2020-11-07 DIAGNOSIS — I69159 Hemiplegia and hemiparesis following nontraumatic intracerebral hemorrhage affecting unspecified side: Secondary | ICD-10-CM

## 2020-11-07 DIAGNOSIS — M6281 Muscle weakness (generalized): Secondary | ICD-10-CM

## 2020-11-07 DIAGNOSIS — R278 Other lack of coordination: Secondary | ICD-10-CM

## 2020-11-09 ENCOUNTER — Ambulatory Visit: Payer: BC Managed Care – PPO

## 2020-11-09 ENCOUNTER — Other Ambulatory Visit: Payer: Self-pay

## 2020-11-09 DIAGNOSIS — M79641 Pain in right hand: Secondary | ICD-10-CM

## 2020-11-09 DIAGNOSIS — I69159 Hemiplegia and hemiparesis following nontraumatic intracerebral hemorrhage affecting unspecified side: Secondary | ICD-10-CM

## 2020-11-09 DIAGNOSIS — R278 Other lack of coordination: Secondary | ICD-10-CM

## 2020-11-09 DIAGNOSIS — I607 Nontraumatic subarachnoid hemorrhage from unspecified intracranial artery: Secondary | ICD-10-CM

## 2020-11-09 DIAGNOSIS — M6281 Muscle weakness (generalized): Secondary | ICD-10-CM | POA: Diagnosis not present

## 2020-11-09 DIAGNOSIS — M25511 Pain in right shoulder: Secondary | ICD-10-CM

## 2020-11-09 NOTE — Therapy (Signed)
[facility] SERVICES [address] [place], [place], [address] Phone: [phone]   Fax:  [phone]  Occupational Therapy Treatment  Patient Details  Name: Erika Stanton MRN: [id number] Date of Birth: 10-08-1955 Referring Provider (OT): Dr. Naaman Plummer   Encounter Date: 11/07/2020   OT End of Session - 11/08/20 1007     Visit Number [id number]    Number of Visits 16    Date for OT Re-Evaluation 12/05/20    Authorization Type Progress report period starting 09/21/2020    OT Start Time 1113    OT Stop Time 1202    OT Time Calculation (min) 49 min    Activity Tolerance Patient tolerated treatment well    Behavior During Therapy [facility] for tasks assessed/performed             Past Medical History:  Diagnosis Date   Aneurysm ([place])    Melena     Past Surgical History:  Procedure Laterality Date   CHOLECYSTECTOMY     COLONOSCOPY  11/2017   at [facility]. no recurrent polyps.  suggest repeat surveillance study 11/2022.     COLONOSCOPY W/ POLYPECTOMY  06/2014   Dr Roxy Manns at [facility].  3 adenomatoous polyps, anal fissure.     ESOPHAGOGASTRODUODENOSCOPY (EGD) WITH PROPOFOL N/A 01/27/2020   Procedure: ESOPHAGOGASTRODUODENOSCOPY (EGD) WITH PROPOFOL;  Surgeon: Mauri Pole, MD;  Location: [place] [facility];  Service: Endoscopy;  Laterality: N/A;   IR 3D INDEPENDENT WKST  01/06/2020   IR ANGIO INTRA EXTRACRAN SEL INTERNAL CAROTID BILAT MOD SED  01/06/2020   IR ANGIO VERTEBRAL SEL VERTEBRAL UNI L MOD SED  01/06/2020   IR ANGIOGRAM FOLLOW UP STUDY  01/06/2020   IR ANGIOGRAM FOLLOW UP STUDY  01/06/2020   IR ANGIOGRAM FOLLOW UP STUDY  01/06/2020   IR ANGIOGRAM FOLLOW UP STUDY  01/06/2020   IR ANGIOGRAM FOLLOW UP STUDY  01/06/2020   IR ANGIOGRAM FOLLOW UP STUDY  01/06/2020   IR ANGIOGRAM FOLLOW UP STUDY  01/06/2020   IR ANGIOGRAM FOLLOW UP STUDY  01/06/2020   IR ANGIOGRAM FOLLOW UP STUDY  01/06/2020   IR ANGIOGRAM FOLLOW UP STUDY  01/06/2020   IR NEURO EACH ADD'L  AFTER BASIC UNI RIGHT (MS)  01/06/2020   IR TRANSCATH/EMBOLIZ  01/06/2020   RADIOLOGY WITH ANESTHESIA N/A 01/06/2020   Procedure: IR WITH ANESTHESIA FOR ANEURYSM;  Surgeon: Consuella Lose, MD;  Location: [place];  Service: Radiology;  Laterality: N/A;   TUBAL LIGATION      There were no vitals filed for this visit.   Subjective Assessment - 11/09/20 0915     Subjective  Pt reports she is doing well, difficult to tell if the injections worked to manage spasticity.  She reports the hand feels the same.    Patient is accompanied by: Family member    Pertinent History ICH 01/06/20, Aneurysm. PMH: OA bilateral knees and hands    Patient Stated Goals Pt would like to be as independent as possible.    Currently in Pain? Yes    Pain Score 2     Pain Location Shoulder    Pain Orientation Right    Pain Descriptors / Indicators Aching    Pain Type Chronic pain    Pain Onset More than a month ago             Therapeutic Exercise Applied moist heat to right shoulder, forearm, wrist and hand for 5 mins prior to therapeutic exercises to decrease pain  and increase ROM. Pain 2/10 with movement.  Pt seen for PROM to RUE, shoulder flexion to 90 degrees, ABD to 85, ER, elbow flexion/extension, supination/pronation, wrist flexion/extension, digit flexion, extension followed by 2 set x 10 reps seated RUE AAROM supination/pronation, wrist ext/flex, digit adduction/abduction, shoulder flexion/abduction, and elbow flex/ext. Active reaching from seated position to tabletop height for multiple reps and sets with cues and occasional guiding to decrease compensatory techniques.   Hand to mouth patterns performed and encouraged pt to utilize for self feeding skills for finger foods.      Self Care Pt seen for folding of pillowcases, from lap position, cues to use lateral pinch on right side to hold and stabilize material.  Completed 3 cases for 3 reps each.  Cues for technique and to decrease substitution at  shoulder.    Response to tx:  Pt reports she is using hand for select functional activities at home, trying to hold items, performing exercises but has not used as much with self feeding and grooming tasks.  Discussed using as much as possible for "normal" daily activities even if she needs to use left hand to assist or guide the movement pattern.  Pt continues to report pain at the shoulder with ROM greater than 90 degrees of flexion but pain decreases as session progresses.  Decreased spasticity noted in the ulnar side of the right hand but still has difficulty with finger extension actively and cannot produce full motion.  Continue to work towards goals in plan of care to maximize safety and independence in necessary daily activities.                       OT Education - 11/09/20 1007     Education Details RUE ROM, functional grasping, participation in functional activities with right hand    Person(s) Educated Patient    Methods Explanation;Demonstration;Tactile cues;Verbal cues    Comprehension Verbalized understanding;Returned demonstration;Verbal cues required              OT Short Term Goals - 10/31/20 1057       OT SHORT TERM GOAL #5   Title Pt will demonstrate improved LUE fine motor coordination for ADLs as evidenced by decreasing 9 hole peg test score to 25 secs or less.    Baseline 10/31/2020: Left FMC: 32 sec. Left FMC 41 sec visa the 9 hole peg test. 08/08/20: 32 seconds via 9 hole peg    Time 6    Period Weeks    Status On-going    Target Date 12/05/20               OT Long Term Goals - 10/31/20 1027       OT LONG TERM GOAL #1   Title Pt will perform all basic ADLs with supervision    Baseline 6/202/2022: Pt. is able to donn stretchy shirts, donn slide on shoes, donning pants. Pt. has difficylty with managing a bra,shirt buttons, and pant zipper/button.Pt. requires minA from her husband following multiple recent surgeries. Pt. requires minA  from her husband. 08/08/20: Pt continues to require MIN A for grooming and dressing tasks.    Time 12    Period Weeks    Status On-going    Target Date 12/05/20      OT LONG TERM GOAL #2   Title Pt will demonstrate 60* RUE shoulder flexion in prep for functional reach with pain less than or equal to 3/10.    Baseline 10/31/20:  Pt. continues to present with limited AROM for isolated  right shoulder flexion with pain. 08/08/20: R AROM Shoulder flexion - 45* c 3/10 pain.    Time 12    Period Weeks    Status On-going    Target Date 12/05/20      OT LONG TERM GOAL #3   Title Pt will use RUE as a stabilizer/ gross A at least 30 % of the time for ADLs/ IADLs.    Baseline Pt. is using her right hand as a gross stabilizer 15% of the time. 08/08/20: requires MAX cues    Time 12    Period Weeks    Status On-going    Target Date 12/05/20      OT LONG TERM GOAL #4   Title Pt will increase LUE grip strength to 25 lbs or greater for increased ease with ADLs.    Baseline 10/31/2020: Pt. continues to present with limited left grip. Pt. has improved with left grip strength. Pt.continues to work towards 25#. 08/08/20: L grip 19#    Time 12    Period Weeks    Status On-going    Target Date 12/05/20      OT LONG TERM GOAL #5   Title 10/31/2020: Pt. is now perfroming laundry, loading the dishwasher, assists some with cooking, light meal preparation, setting the table    Baseline Pt. assists with filling the dishwasher, and performing laundry. Pt. continues to try to help when she can, however her husband mostly performs these tasks. 08/08/20: Husband performs    Time 12    Period Weeks    Status On-going    Target Date 12/05/20      OT LONG TERM GOAL #6   Title Pt will demonstrate ability to grasp/ release a cup 2/3 trials with RUE.    Baseline 10/31/2020: Pt. continues to initiate grasp, and active release of objects, however is unable to grasp/release a cup. 08/08/20: Able to grasp, unable to release x1  trial    Time 12    Period Weeks    Status On-going    Target Date 12/05/20      OT LONG TERM GOAL #8   Title I with updated HEP.    Baseline 09/12/2020: Pt. conitinues to require assist for her HEP. Pt. requires assist, and cuing for HEP. 08/08/20: reports completing with husband assist for technique    Time 12    Period Weeks    Status Partially Met    Target Date 12/05/20      OT LONG TERM GOAL  #9   TITLE Pt. will use her right hand to assist the left hand in searching for items in, and holding her purse.    Baseline Independent    Time 12    Period Weeks    Status Achieved      OT LONG TERM GOAL  #10   TITLE Pt. will use her right hand to assist the left to independently place eye glasses into a case.    Baseline Independent    Time 12    Period Weeks    Status Achieved                   Plan - 11/08/20 1008     Clinical Impression Statement Pt reports she is using hand for select functional activities at home, trying to hold items, performing exercises but has not used as much with self feeding and grooming tasks.  Discussed using as  much as possible for "normal" daily activities even if she needs to use left hand to assist or guide the movement pattern.  Pt continues to report pain at the shoulder with ROM greater than 90 degrees of flexion but pain decreases as session progresses.  Decreased spasticity noted in the ulnar side of the right hand but still has difficulty with finger extension actively and cannot produce full motion.  Continue to work towards goals in plan of care to maximize safety and independence in necessary daily activities.    OT Occupational Profile and History Detailed Assessment- Review of Records and additional review of physical, cognitive, psychosocial history related to current functional performance    Occupational performance deficits (Please refer to evaluation for details): ADL's;IADL's;Rest and Sleep;Work;Play;Leisure    Body Structure  / Function / Physical Skills ADL;Balance;Coordination;Decreased knowledge of precautions;Decreased knowledge of use of DME;Dexterity;Edema;Mobility;Tone;Strength;IADL;Sensation;GMC;Gait;ROM;FMC;Flexibility;Pain;Vision;UE functional use;Endurance    Cognitive Skills Attention;Perception;Problem Solve;Safety Awareness;Thought    Rehab Potential Good    Clinical Decision Making Several treatment options, min-mod task modification necessary    Comorbidities Affecting Occupational Performance: None    Modification or Assistance to Complete Evaluation  Min-Moderate modification of tasks or assist with assess necessary to complete eval    OT Frequency 3x / week    OT Duration 12 weeks    OT Treatment/Interventions Self-care/ADL training;Ultrasound;Energy conservation;Visual/perceptual remediation/compensation;Patient/family education;DME and/or AE instruction;Aquatic Therapy;Paraffin;Gait Training;Passive range of motion;Balance training;Fluidtherapy;Electrical Stimulation;Functional Mobility Training;Splinting;Moist Heat;Therapeutic exercise;Manual Therapy;Cognitive remediation/compensation;Manual lymph drainage;Neuromuscular education;Coping strategies training    Consulted and Agree with Plan of Care Patient    Family Member Consulted Spose present during the session.             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, unspecified laterality (HCC)    Problem List Patient Active Problem List   Diagnosis Date Noted   Dyspareunia in female 04/05/2020   History of abnormal cervical Pap smear 03/10/2020   GIB (gastrointestinal bleeding) 02/28/2020   Elevated BUN    Prediabetes    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   Cerebral vasospasm    Sinus tachycardia    Dysphagia, post-stroke    Thrombocytopenia (HCC)    Acute blood loss anemia    Brain aneurysm    ICH (intracerebral hemorrhage) ([place]) 01/05/2020   History of adenomatous polyp of colon 02/07/2016   Anatomical narrow angle, 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 [place], [place], [address] Phone: [phone]   Fax:  [phone]  Name: Erika Stanton MRN: [id number] Date of Birth: [birth year]

## 2020-11-09 NOTE — Therapy (Signed)
[place] MAIN [facility] SERVICES [address] [place], [place], [address] Phone: [phone]   Fax:  [phone]  Occupational Therapy Treatment  Patient Details  Name: Erika Stanton MRN: [id number] Date of Birth: August 20, 1955 Referring Provider (OT): Dr. Naaman Plummer   Encounter Date: 11/09/2020   OT End of Session - 11/09/20 1129     Visit Number [id number]    Number of Visits 69    Date for OT Re-Evaluation 12/05/20    Authorization Type Progress report period starting 09/21/2020    Authorization Time [place]    OT Start Time 1015    OT Stop Time 1100    OT Time Calculation (min) 45 min    Activity Tolerance Patient tolerated treatment well    Behavior During Therapy [facility] for tasks assessed/performed             Past Medical History:  Diagnosis Date   Aneurysm ([place])    Melena     Past Surgical History:  Procedure Laterality Date   CHOLECYSTECTOMY     COLONOSCOPY  11/2017   at [facility] no recurrent polyps.  suggest repeat surveillance study 11/2022.     COLONOSCOPY W/ POLYPECTOMY  06/2014   Dr Roxy Manns at [facility].  3 adenomatoous polyps, anal fissure.     ESOPHAGOGASTRODUODENOSCOPY (EGD) WITH PROPOFOL N/A 01/27/2020   Procedure: ESOPHAGOGASTRODUODENOSCOPY (EGD) WITH PROPOFOL;  Surgeon: Mauri Pole, MD;  Location: [facility];  Service: Endoscopy;  Laterality: N/A;   IR 3D INDEPENDENT WKST  01/06/2020   IR ANGIO INTRA EXTRACRAN SEL INTERNAL CAROTID BILAT MOD SED  01/06/2020   IR ANGIO VERTEBRAL SEL VERTEBRAL UNI L MOD SED  01/06/2020   IR ANGIOGRAM FOLLOW UP STUDY  01/06/2020   IR ANGIOGRAM FOLLOW UP STUDY  01/06/2020   IR ANGIOGRAM FOLLOW UP STUDY  01/06/2020   IR ANGIOGRAM FOLLOW UP STUDY  01/06/2020   IR ANGIOGRAM FOLLOW UP STUDY  01/06/2020   IR ANGIOGRAM FOLLOW UP STUDY  01/06/2020   IR ANGIOGRAM FOLLOW UP STUDY  01/06/2020   IR ANGIOGRAM FOLLOW UP STUDY  01/06/2020   IR ANGIOGRAM FOLLOW UP STUDY  01/06/2020   IR ANGIOGRAM FOLLOW UP  STUDY  01/06/2020   IR NEURO EACH ADD'L AFTER BASIC UNI RIGHT (MS)  01/06/2020   IR TRANSCATH/EMBOLIZ  01/06/2020   RADIOLOGY WITH ANESTHESIA N/A 01/06/2020   Procedure: IR WITH ANESTHESIA FOR ANEURYSM;  Surgeon: Consuella Lose, MD;  Location: [facility];  Service: Radiology;  Laterality: N/A;   TUBAL LIGATION      There were no vitals filed for this visit.   Subjective Assessment - 11/09/20 1124     Subjective  "[place] over [place] 4[place] of [place]."    Patient is accompanied by: Family member    Pertinent History ICH 01/06/20, Aneurysm. PMH: OA bilateral knees and hands    Repetition Decreases Symptoms    Patient Stated Goals Pt would like to be as independent as possible.    Currently in Pain? Yes    Pain Score 2     Pain Location Shoulder    Pain Orientation Right    Pain Descriptors / Indicators Tightness;Aching;Sore    Pain Type Chronic pain    Pain Onset More than a month ago    Pain Frequency Intermittent    Aggravating Factors  cold weather, activity    Pain Relieving Factors rest/heat    Effect of Pain  on Daily Activities decreased activity tolerance    Multiple Pain Sites Yes    Pain Score 2    Pain Location Hand    Pain Orientation Right    Pain Descriptors / Indicators Sore;Tightness    Pain Type Chronic pain    Pain Onset More than a month ago    Pain Frequency Intermittent    Aggravating Factors  moving it    Pain Relieving Factors rest, heat            Occupational Therapy Treatment: Therapeutic Exercise: Performed PROM/AAROM to R shoulder for forward flex, abd, horiz abd/add, ER, working to increase functional ROM for self care.  Moist heat applied intermittently throughout session simultaneous with exercises noted above.  Performed passive stretching for R hand digit extension and palmer abduction.  Instructed pt in self ER stretch with R forearm on table, elbow bent, patient to lean forward as tolerated with good  return demo.     Neuro re-ed: Performed AAROM for R hand digit extension and digit abduction/adduction and instructed pt in completing digit abd/add herself on tabletop with good return demo. Participated in [place] active assisted functional reaching to top of head and contralateral shoulder x10 each.    Response to Treatment: RUE flexor tone, hemiparesis, and pain in R shoulder and hand continue to limit functional use of RUE with self care.  OT educated pt on ensuring she focus on self ROM at home regularly in order to increase range for functional movement.  Pt reports good compliance and showed good return demo of ER stretch and digit AAROM completed this day. Pt will benefit from continued Occupational Therapy to address deficits noted above in order to maximize indep with ADLs/IADLs/leisure.       OT Education - 11/09/20 1129     Education Details self ROM for R shoulder ER with arm on table top and pt bending forward, R hand AAROM    Person(s) Educated Patient    Methods Explanation;Demonstration;Tactile cues;Verbal cues    Comprehension Verbalized understanding;Returned demonstration;Verbal cues required              OT Short Term Goals - 10/31/20 1057       OT SHORT TERM GOAL #5   Title Pt will demonstrate improved LUE fine motor coordination for ADLs as evidenced by decreasing 9 hole peg test score to 25 secs or less.    Baseline 10/31/2020: Left FMC: 32 sec. Left FMC 41 sec visa the 9 hole peg test. 08/08/20: 32 seconds via 9 hole peg    Time 6    Period Weeks    Status On-going    Target Date 12/05/20               OT Long Term Goals - 10/31/20 1027       OT LONG TERM GOAL #1   Title Pt will perform all basic ADLs with supervision    Baseline 6/202/2022: Pt. is able to donn stretchy shirts, donn slide on shoes, donning pants. Pt. has difficylty with managing a bra,shirt buttons, and pant zipper/button.Pt. requires minA from her husband following multiple recent  surgeries. Pt. requires minA from her husband. 08/08/20: Pt continues to require MIN A for grooming and dressing tasks.    Time 12    Period Weeks    Status On-going    Target Date 12/05/20      OT LONG TERM GOAL #2   Title Pt will demonstrate 60* RUE shoulder flexion in  prep for functional reach with pain less than or equal to 3/10.    Baseline 10/31/20: Pt. continues to present with limited AROM for isolated  right shoulder flexion with pain. 08/08/20: R AROM Shoulder flexion - 45* c 3/10 pain.    Time 12    Period Weeks    Status On-going    Target Date 12/05/20      OT LONG TERM GOAL #3   Title Pt will use RUE as a stabilizer/ gross A at least 30 % of the time for ADLs/ IADLs.    Baseline Pt. is using her right hand as a gross stabilizer 15% of the time. 08/08/20: requires MAX cues    Time 12    Period Weeks    Status On-going    Target Date 12/05/20      OT LONG TERM GOAL #4   Title Pt will increase LUE grip strength to 25 lbs or greater for increased ease with ADLs.    Baseline 10/31/2020: Pt. continues to present with limited left grip. Pt. has improved with left grip strength. Pt.continues to work towards 25#. 08/08/20: L grip 19#    Time 12    Period Weeks    Status On-going    Target Date 12/05/20      OT LONG TERM GOAL #5   Title 10/31/2020: Pt. is now perfroming laundry, loading the dishwasher, assists some with cooking, light meal preparation, setting the table    Baseline Pt. assists with filling the dishwasher, and performing laundry. Pt. continues to try to help when she can, however her husband mostly performs these tasks. 08/08/20: Husband performs    Time 12    Period Weeks    Status On-going    Target Date 12/05/20      OT LONG TERM GOAL #6   Title Pt will demonstrate ability to grasp/ release a cup 2/3 trials with RUE.    Baseline 10/31/2020: Pt. continues to initiate grasp, and active release of objects, however is unable to grasp/release a cup. 08/08/20: Able to  grasp, unable to release x1 trial    Time 12    Period Weeks    Status On-going    Target Date 12/05/20      OT LONG TERM GOAL #8   Title I with updated HEP.    Baseline 09/12/2020: Pt. conitinues to require assist for her HEP. Pt. requires assist, and cuing for HEP. 08/08/20: reports completing with husband assist for technique    Time 12    Period Weeks    Status Partially Met    Target Date 12/05/20      OT LONG TERM GOAL  #9   TITLE Pt. will use her right hand to assist the left hand in searching for items in, and holding her purse.    Baseline Independent    Time 12    Period Weeks    Status Achieved      OT LONG TERM GOAL  #10   TITLE Pt. will use her right hand to assist the left to independently place eye glasses into a case.    Baseline Independent    Time 12    Period Weeks    Status Achieved              Plan - 11/09/20 1146     Clinical Impression Statement RUE flexor tone, hemiparesis, and pain in R shoulder and hand continue to limit functional use of RUE with self care.  OT educated pt on ensuring she focus on self ROM at home regularly in order to increase range for functional movement.  Pt reports good compliance and showed good return demo of ER stretch and digit AAROM completed this day. Pt will benefit from continued Occupational Therapy to address deficits noted above in order to maximize indep with ADLs/IADLs/leisure.    OT Occupational Profile and History Detailed Assessment- Review of Records and additional review of physical, cognitive, psychosocial history related to current functional performance    Occupational performance deficits (Please refer to evaluation for details): ADL's;IADL's;Rest and Sleep;Work;Play;Leisure    Body Structure / Function / Physical Skills ADL;Balance;Coordination;Decreased knowledge of precautions;Decreased knowledge of use of DME;Dexterity;Edema;Mobility;Tone;Strength;IADL;Sensation;GMC;Gait;ROM;FMC;Flexibility;Pain;Vision;UE  functional use;Endurance    Cognitive Skills Attention;Perception;Problem Solve;Safety Awareness;Thought    Rehab Potential Good    Clinical Decision Making Several treatment options, min-mod task modification necessary    Comorbidities Affecting Occupational Performance: None    Modification or Assistance to Complete Evaluation  Min-Moderate modification of tasks or assist with assess necessary to complete eval    OT Frequency 3x / week    OT Duration 12 weeks    OT Treatment/Interventions Self-care/ADL training;Ultrasound;Energy conservation;Visual/perceptual remediation/compensation;Patient/family education;DME and/or AE instruction;Aquatic Therapy;Paraffin;Gait Training;Passive range of motion;Balance training;Fluidtherapy;Electrical Stimulation;Functional Mobility Training;Splinting;Moist Heat;Therapeutic exercise;Manual Therapy;Cognitive remediation/compensation;Manual lymph drainage;Neuromuscular education;Coping strategies training    Consulted and Agree with Plan of Care Patient    Family Member Consulted Spose present during the session.             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, unspecified laterality (HCC)  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HCC)  Acute pain of right shoulder  Pain in right hand    Problem List Patient Active Problem List   Diagnosis Date Noted   Dyspareunia in  female 04/05/2020   History of abnormal cervical Pap smear 03/10/2020   GIB (gastrointestinal bleeding) 02/28/2020   Elevated BUN    Prediabetes    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   Cerebral vasospasm    Sinus tachycardia    Dysphagia, post-stroke    Thrombocytopenia (HCC)    Acute blood loss anemia    Brain aneurysm    ICH (intracerebral hemorrhage) ([place]) 01/05/2020   History of adenomatous polyp of colon 02/07/2016   Anatomical narrow angle, bilateral 12/14/2014   Fissure in ano 11/11/2014   Hemorrhoids 11/11/2014   Constipation 11/19/2013   GERD (gastroesophageal reflux disease) 03/24/2013   Irritable bowel syndrome with diarrhea 03/24/2013   Herpes zoster 05/14/2005   Abnormal Pap smear of cervix 05/15/1983   History of cervical dysplasia 05/15/1983   Leta Speller, MS, OTR/L  Darleene Cleaver 11/09/2020, 11:47 AM  [facility] [address] [place], [place], [address] Phone: [phone]   Fax:  [phone]  Name: Erika Stanton MRN: [id number] Date of Birth: 05-Aug-1955

## 2020-11-11 ENCOUNTER — Other Ambulatory Visit: Payer: Self-pay

## 2020-11-11 ENCOUNTER — Ambulatory Visit: Payer: BC Managed Care – PPO | Attending: Physical Medicine & Rehabilitation

## 2020-11-11 DIAGNOSIS — M25511 Pain in right shoulder: Secondary | ICD-10-CM | POA: Insufficient documentation

## 2020-11-11 DIAGNOSIS — I69159 Hemiplegia and hemiparesis following nontraumatic intracerebral hemorrhage affecting unspecified side: Secondary | ICD-10-CM | POA: Insufficient documentation

## 2020-11-11 DIAGNOSIS — M6281 Muscle weakness (generalized): Secondary | ICD-10-CM | POA: Diagnosis present

## 2020-11-11 DIAGNOSIS — R278 Other lack of coordination: Secondary | ICD-10-CM | POA: Insufficient documentation

## 2020-11-11 DIAGNOSIS — I607 Nontraumatic subarachnoid hemorrhage from unspecified intracranial artery: Secondary | ICD-10-CM | POA: Diagnosis present

## 2020-11-11 DIAGNOSIS — M79641 Pain in right hand: Secondary | ICD-10-CM | POA: Insufficient documentation

## 2020-11-11 NOTE — Therapy (Signed)
[place] MAIN [facility] SERVICES [address] [place], [place], [address] Phone: [phone]   Fax:  [phone]  Occupational Therapy Treatment  Patient Details  Name: Erika Stanton MRN: [id number] Date of Birth: [birth year] Referring Provider (OT): Dr. Naaman Plummer   Encounter Date: 11/11/2020   OT End of Session - 11/11/20 1109     Visit Number 22    Number of Visits 72    Date for OT Re-Evaluation 12/05/20    Authorization Type Progress report period starting 09/21/2020    Authorization Time [place]    OT Start Time 1015    OT Stop Time 1100    OT Time Calculation (min) 45 min    Activity Tolerance Patient tolerated treatment well    Behavior During Therapy [facility] for tasks assessed/performed             Past Medical History:  Diagnosis Date   Aneurysm ([place])    Melena     Past Surgical History:  Procedure Laterality Date   CHOLECYSTECTOMY     COLONOSCOPY  11/2017   at [facility]. no recurrent polyps.  suggest repeat surveillance study 11/2022.     COLONOSCOPY W/ POLYPECTOMY  06/2014   Dr Roxy Manns at [facility].  3 adenomatoous polyps, anal fissure.     ESOPHAGOGASTRODUODENOSCOPY (EGD) WITH PROPOFOL N/A 01/27/2020   Procedure: ESOPHAGOGASTRODUODENOSCOPY (EGD) WITH PROPOFOL;  Surgeon: Mauri Pole, MD;  Location: [facility];  Service: Endoscopy;  Laterality: N/A;   IR 3D INDEPENDENT WKST  01/06/2020   IR ANGIO INTRA EXTRACRAN SEL INTERNAL CAROTID BILAT MOD SED  01/06/2020   IR ANGIO VERTEBRAL SEL VERTEBRAL UNI L MOD SED  01/06/2020   IR ANGIOGRAM FOLLOW UP STUDY  01/06/2020   IR ANGIOGRAM FOLLOW UP STUDY  01/06/2020   IR ANGIOGRAM FOLLOW UP STUDY  01/06/2020   IR ANGIOGRAM FOLLOW UP STUDY  01/06/2020   IR ANGIOGRAM FOLLOW UP STUDY  01/06/2020   IR ANGIOGRAM FOLLOW UP STUDY  01/06/2020   IR ANGIOGRAM FOLLOW UP STUDY  01/06/2020   IR ANGIOGRAM FOLLOW UP STUDY  01/06/2020   IR ANGIOGRAM FOLLOW UP STUDY  01/06/2020   IR ANGIOGRAM FOLLOW UP  STUDY  01/06/2020   IR NEURO EACH ADD'L AFTER BASIC UNI RIGHT (MS)  01/06/2020   IR TRANSCATH/EMBOLIZ  01/06/2020   RADIOLOGY WITH ANESTHESIA N/A 01/06/2020   Procedure: IR WITH ANESTHESIA FOR ANEURYSM;  Surgeon: Consuella Lose, MD;  Location: [facility];  Service: Radiology;  Laterality: N/A;   TUBAL LIGATION      There were no vitals filed for this visit.   Subjective Assessment - 11/11/20 1106     Subjective  "I've stretched a little bit at home like you showed me."    Patient is accompanied by: Family member    Pertinent History ICH 01/06/20, Aneurysm. PMH: OA bilateral knees and hands    Repetition Decreases Symptoms    Patient Stated Goals Pt would like to be as independent as possible.    Currently in Pain? Yes    Pain Score 3     Pain Location Shoulder    Pain Orientation Right    Pain Descriptors / Indicators Aching;Tightness;Sore    Pain Type Chronic pain    Pain Onset More than a month ago    Pain Frequency Intermittent    Aggravating Factors  cold weather, activity    Pain Relieving Factors rest/heat    Effect of Pain on Daily Activities decreased activity  tolerance    Multiple Pain Sites Yes    Pain Score 3    Pain Location Hand    Pain Orientation Right    Pain Descriptors / Indicators Sore;Tightness    Pain Type Chronic pain    Pain Onset More than a month ago    Pain Frequency Intermittent    Aggravating Factors  moving it    Pain Relieving Factors rest, heat    Effect of Pain on Daily Activities decreased ADL ability            Occupational Therapy Treatment: Neuro re-ed: Heat application to R shoulder and hand x5 min in prep for passive and AAROM throughout RUE.  Completed passive stretching for R shoulder ER, AAROM for R shoulder flex/abd, passive stretching for R wrist and digit ext.  Performed active digit extension blocking wrist and MP joint to isolate for PIP extension of all digits, active digit abd/adduction.  Focussed on picking up small balls with  R hand and placing into container with vc to pronate R forearm and flex R wrist to facilitate increased digit extension in prep for opening hand to grasp ball.  Performed UBE x5 min with min resistance, focus on reciprocal motion for BUEs.  Vc to minimize compensatory trunk rotation when pedaling forward on UBE in order to achieve better forward reach and elbow extension, vc to avoid hiking R shoulder.    Self Care:  Passive ER stretch and active assisted ER to reach to side of head to simulate motion for coming hair.  Used long handled comb and able to stroke hair with comb on R side of head with therapist helping to facilitate normal movement patterns.    Response to treatment: Pt felt verbal cues to pronate forearm and flex wrist were effective in opening R hand to grasp small balls on table top.  Pt was able to use this strategy and achieve 90% accuracy with picking up balls and placing into container without dropping.  Continued encouragement to passively stretch R hand digits and shoulder at home in order to maximize flexibility for functional movement.  Pt will benefit from continued Occupational Therapy to address R shoulder and hand pain, R sided hemiparesis, and hypertonicity in R hand in order to increase functional use of RUE for ADLs/IADLs/leisure.      OT Education - 11/11/20 1109     Education Details self ROM and functional benefits of increasing R shoulder ER flexibility    Person(s) Educated Patient    Methods Explanation;Demonstration;Tactile cues;Verbal cues    Comprehension Verbalized understanding;Returned demonstration;Verbal cues required              OT Short Term Goals - 10/31/20 1057       OT SHORT TERM GOAL #5   Title Pt will demonstrate improved LUE fine motor coordination for ADLs as evidenced by decreasing 9 hole peg test score to 25 secs or less.    Baseline 10/31/2020: Left FMC: 32 sec. Left FMC 41 sec visa the 9 hole peg test. 08/08/20: 32 seconds via 9 hole  peg    Time 6    Period Weeks    Status On-going    Target Date 12/05/20               OT Long Term Goals - 10/31/20 1027       OT LONG TERM GOAL #1   Title Pt will perform all basic ADLs with supervision    Baseline 6/202/2022: Pt. is  able to donn stretchy shirts, donn slide on shoes, donning pants. Pt. has difficylty with managing a bra,shirt buttons, and pant zipper/button.Pt. requires minA from her husband following multiple recent surgeries. Pt. requires minA from her husband. 08/08/20: Pt continues to require MIN A for grooming and dressing tasks.    Time 12    Period Weeks    Status On-going    Target Date 12/05/20      OT LONG TERM GOAL #2   Title Pt will demonstrate 60* RUE shoulder flexion in prep for functional reach with pain less than or equal to 3/10.    Baseline 10/31/20: Pt. continues to present with limited AROM for isolated  right shoulder flexion with pain. 08/08/20: R AROM Shoulder flexion - 45* c 3/10 pain.    Time 12    Period Weeks    Status On-going    Target Date 12/05/20      OT LONG TERM GOAL #3   Title Pt will use RUE as a stabilizer/ gross A at least 30 % of the time for ADLs/ IADLs.    Baseline Pt. is using her right hand as a gross stabilizer 15% of the time. 08/08/20: requires MAX cues    Time 12    Period Weeks    Status On-going    Target Date 12/05/20      OT LONG TERM GOAL #4   Title Pt will increase LUE grip strength to 25 lbs or greater for increased ease with ADLs.    Baseline 10/31/2020: Pt. continues to present with limited left grip. Pt. has improved with left grip strength. Pt.continues to work towards 25#. 08/08/20: L grip 19#    Time 12    Period Weeks    Status On-going    Target Date 12/05/20      OT LONG TERM GOAL #5   Title 10/31/2020: Pt. is now perfroming laundry, loading the dishwasher, assists some with cooking, light meal preparation, setting the table    Baseline Pt. assists with filling the dishwasher, and performing  laundry. Pt. continues to try to help when she can, however her husband mostly performs these tasks. 08/08/20: Husband performs    Time 12    Period Weeks    Status On-going    Target Date 12/05/20      OT LONG TERM GOAL #6   Title Pt will demonstrate ability to grasp/ release a cup 2/3 trials with RUE.    Baseline 10/31/2020: Pt. continues to initiate grasp, and active release of objects, however is unable to grasp/release a cup. 08/08/20: Able to grasp, unable to release x1 trial    Time 12    Period Weeks    Status On-going    Target Date 12/05/20      OT LONG TERM GOAL #8   Title I with updated HEP.    Baseline 09/12/2020: Pt. conitinues to require assist for her HEP. Pt. requires assist, and cuing for HEP. 08/08/20: reports completing with husband assist for technique    Time 12    Period Weeks    Status Partially Met    Target Date 12/05/20      OT LONG TERM GOAL  #9   TITLE Pt. will use her right hand to assist the left hand in searching for items in, and holding her purse.    Baseline Independent    Time 12    Period Weeks    Status Achieved      OT LONG  TERM GOAL  #10   TITLE Pt. will use her right hand to assist the left to independently place eye glasses into a case.    Baseline Independent    Time 12    Period Weeks    Status Achieved              Plan - 11/11/20 1144     Clinical Impression Statement Pt felt verbal cues to pronate forearm and flex wrist were effective in opening R hand to grasp small balls on table top.  Pt was able to use this strategy and achieve 90% accuracy with picking up balls and placing into container without dropping.  Continued encouragement to passively stretch R hand digits and shoulder at home in order to maximize flexibility for functional movement.  Pt will benefit from continued Occupational Therapy to address R shoulder and hand pain, R sided hemiparesis, and hypertonicity in R hand in order to increase functional use of RUE for  ADLs/IADLs/leisure.    OT Occupational Profile and History Detailed Assessment- Review of Records and additional review of physical, cognitive, psychosocial history related to current functional performance    Occupational performance deficits (Please refer to evaluation for details): ADL's;IADL's;Rest and Sleep;Work;Play;Leisure    Body Structure / Function / Physical Skills ADL;Balance;Coordination;Decreased knowledge of precautions;Decreased knowledge of use of DME;Dexterity;Edema;Mobility;Tone;Strength;IADL;Sensation;GMC;Gait;ROM;FMC;Flexibility;Pain;Vision;UE functional use;Endurance    Cognitive Skills Attention;Perception;Problem Solve;Safety Awareness;Thought    Rehab Potential Good    Clinical Decision Making Several treatment options, min-mod task modification necessary    Comorbidities Affecting Occupational Performance: None    Modification or Assistance to Complete Evaluation  Min-Moderate modification of tasks or assist with assess necessary to complete eval    OT Frequency 3x / week    OT Duration 12 weeks    OT Treatment/Interventions Self-care/ADL training;Ultrasound;Energy conservation;Visual/perceptual remediation/compensation;Patient/family education;DME and/or AE instruction;Aquatic Therapy;Paraffin;Gait Training;Passive range of motion;Balance training;Fluidtherapy;Electrical Stimulation;Functional Mobility Training;Splinting;Moist Heat;Therapeutic exercise;Manual Therapy;Cognitive remediation/compensation;Manual lymph drainage;Neuromuscular education;Coping strategies training    Consulted and Agree with Plan of Care Patient    Family Member Consulted Spose present during the session.             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, unspecified laterality (HCC)  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HCC)  Acute pain of right shoulder  Pain in right hand    Problem List Patient Active Problem List   Diagnosis Date Noted   Dyspareunia in female 04/05/2020   History of abnormal cervical Pap smear 03/10/2020   GIB (gastrointestinal bleeding) 02/28/2020   Elevated BUN    Prediabetes    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   Cerebral vasospasm    Sinus tachycardia    Dysphagia, post-stroke    Thrombocytopenia (HCC)    Acute blood loss anemia    Brain aneurysm    ICH (intracerebral hemorrhage) ([place]) 01/05/2020   History of adenomatous polyp of colon 02/07/2016   Anatomical narrow angle, 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

## 2020-11-15 ENCOUNTER — Ambulatory Visit: Payer: BC Managed Care – PPO

## 2020-11-17 ENCOUNTER — Ambulatory Visit: Payer: BC Managed Care – PPO

## 2020-11-21 ENCOUNTER — Ambulatory Visit: Payer: BC Managed Care – PPO | Admitting: Occupational Therapy

## 2020-11-21 ENCOUNTER — Other Ambulatory Visit: Payer: Self-pay

## 2020-11-21 DIAGNOSIS — R278 Other lack of coordination: Secondary | ICD-10-CM

## 2020-11-21 DIAGNOSIS — M25511 Pain in right shoulder: Secondary | ICD-10-CM

## 2020-11-21 DIAGNOSIS — I69159 Hemiplegia and hemiparesis following nontraumatic intracerebral hemorrhage affecting unspecified side: Secondary | ICD-10-CM

## 2020-11-21 DIAGNOSIS — M6281 Muscle weakness (generalized): Secondary | ICD-10-CM | POA: Diagnosis not present

## 2020-11-21 DIAGNOSIS — I607 Nontraumatic subarachnoid hemorrhage from unspecified intracranial artery: Secondary | ICD-10-CM

## 2020-11-22 ENCOUNTER — Ambulatory Visit: Payer: BC Managed Care – PPO | Admitting: Occupational Therapy

## 2020-11-24 ENCOUNTER — Other Ambulatory Visit: Payer: Self-pay

## 2020-11-24 ENCOUNTER — Ambulatory Visit: Payer: BC Managed Care – PPO | Admitting: Occupational Therapy

## 2020-11-24 ENCOUNTER — Encounter: Payer: Self-pay | Admitting: Occupational Therapy

## 2020-11-24 DIAGNOSIS — R278 Other lack of coordination: Secondary | ICD-10-CM

## 2020-11-24 DIAGNOSIS — M6281 Muscle weakness (generalized): Secondary | ICD-10-CM | POA: Diagnosis not present

## 2020-11-24 NOTE — Therapy (Signed)
[place] MAIN [facility] SERVICES [address] [place], [place], [address] Phone: [phone]   Fax:  [phone]  Occupational Therapy Treatment  Patient Details  Name: Erika Stanton MRN: [id number] Date of Birth: [birth year] Referring Provider (OT): Dr. Naaman Plummer   Encounter Date: 11/24/2020   OT End of Session - 11/24/20 1803     Visit Number 76    Number of Visits 84    Date for OT Re-Evaluation 12/05/20    Authorization Type Progress report period starting 09/21/2020    OT Start Time 1300    OT Stop Time 1345    OT Time Calculation (min) 45 min    Activity Tolerance Patient tolerated treatment well    Behavior During Therapy [facility] for tasks assessed/performed             Past Medical History:  Diagnosis Date   Aneurysm ([place])    Melena     Past Surgical History:  Procedure Laterality Date   CHOLECYSTECTOMY     COLONOSCOPY  11/2017   at [facility]. no recurrent polyps.  suggest repeat surveillance study 11/2022.     COLONOSCOPY W/ POLYPECTOMY  06/2014   Dr Roxy Manns at [facility].  3 adenomatoous polyps, anal fissure.     ESOPHAGOGASTRODUODENOSCOPY (EGD) WITH PROPOFOL N/A 01/27/2020   Procedure: ESOPHAGOGASTRODUODENOSCOPY (EGD) WITH PROPOFOL;  Surgeon: Mauri Pole, MD;  Location: [facility];  Service: Endoscopy;  Laterality: N/A;   IR 3D INDEPENDENT WKST  01/06/2020   IR ANGIO INTRA EXTRACRAN SEL INTERNAL CAROTID BILAT MOD SED  01/06/2020   IR ANGIO VERTEBRAL SEL VERTEBRAL UNI L MOD SED  01/06/2020   IR ANGIOGRAM FOLLOW UP STUDY  01/06/2020   IR ANGIOGRAM FOLLOW UP STUDY  01/06/2020   IR ANGIOGRAM FOLLOW UP STUDY  01/06/2020   IR ANGIOGRAM FOLLOW UP STUDY  01/06/2020   IR ANGIOGRAM FOLLOW UP STUDY  01/06/2020   IR ANGIOGRAM FOLLOW UP STUDY  01/06/2020   IR ANGIOGRAM FOLLOW UP STUDY  01/06/2020   IR ANGIOGRAM FOLLOW UP STUDY  01/06/2020   IR ANGIOGRAM FOLLOW UP STUDY  01/06/2020   IR ANGIOGRAM FOLLOW UP STUDY  01/06/2020   IR NEURO EACH ADD'L  AFTER BASIC UNI RIGHT (MS)  01/06/2020   IR TRANSCATH/EMBOLIZ  01/06/2020   RADIOLOGY WITH ANESTHESIA N/A 01/06/2020   Procedure: IR WITH ANESTHESIA FOR ANEURYSM;  Surgeon: Consuella Lose, MD;  Location: [place];  Service: Radiology;  Laterality: N/A;   TUBAL LIGATION      There were no vitals filed for this visit.   Subjective Assessment - 11/24/20 1801     Subjective  Pt. reports having a had a nice time in [place].    Patient is accompanied by: Family member    Pertinent History ICH 01/06/20, Aneurysm. PMH: OA bilateral knees and hands    Patient Stated Goals Pt would like to be as independent as possible.    Currently in Pain? No/denies    Pain Score 2     Pain Location Shoulder    Pain Orientation Right    Pain Descriptors / Indicators Aching;Tightness            OT TREATMENT     Therapeutic Exercise:   Pt. was able to tolerate AAROM/PROM for shoulder flexion, abduction, elbow flexion, elbow extension, AAROM supination PROM for right wrist, digit MP, PIP, and DIP flexion, and extension, AROM digit MP, PIP, and DIP flexion, extension.Thumb radial, and palmar abduction, Thumb IP flexion, and  extension in preparation for grasping objects. Pt. Worked on BUE strengthening, and reciprocal motion using the UBE while seated for 5 min. with no resistance. Constant monitoring was provided.   Neuromuscular re-ed:   Pt. worked on using her right hand for grasping [place] style discs from a container, and reaching up to place them onto a flat tabletop surface. Emphasis was placed on extending, and opening the digits with her right hand with tapering support proximally at her elbow.   Pt. continues to present with  2/10 pain in her right hand/thumb.  Pt. continues to tolerate moist heat prior to, and in conjunction with ROM. Pt. Tolerated ROM well today. Pt. continues to present with Improving active digit grasp, and release with her right thumb, 2nd, and 3rd digits. Support was gradually  tapered off the RUE when reaching to place the discs onto the tabletop surface. Pt. continues to work on improving RUE ROM, and functional reaching in preparation for engaging her UE during ADLs, and IADL tasks. Pt. continues to work on improving RUE ROM, and functional hand use during ADLs, and IADL tasks.                             OT Education - 11/24/20 1803     Education Details self directed ROM, functional use of hand with grasping, review of ER    Person(s) Educated Patient    Methods Explanation;Demonstration;Tactile cues;Verbal cues    Comprehension Verbalized understanding;Returned demonstration;Verbal cues required              OT Short Term Goals - 10/31/20 1057       OT SHORT TERM GOAL #5   Title Pt will demonstrate improved LUE fine motor coordination for ADLs as evidenced by decreasing 9 hole peg test score to 25 secs or less.    Baseline 10/31/2020: Left FMC: 32 sec. Left FMC 41 sec visa the 9 hole peg test. 08/08/20: 32 seconds via 9 hole peg    Time 6    Period Weeks    Status On-going    Target Date 12/05/20               OT Long Term Goals - 10/31/20 1027       OT LONG TERM GOAL #1   Title Pt will perform all basic ADLs with supervision    Baseline 6/202/2022: Pt. is able to donn stretchy shirts, donn slide on shoes, donning pants. Pt. has difficylty with managing a bra,shirt buttons, and pant zipper/button.Pt. requires minA from her husband following multiple recent surgeries. Pt. requires minA from her husband. 08/08/20: Pt continues to require MIN A for grooming and dressing tasks.    Time 12    Period Weeks    Status On-going    Target Date 12/05/20      OT LONG TERM GOAL #2   Title Pt will demonstrate 60* RUE shoulder flexion in prep for functional reach with pain less than or equal to 3/10.    Baseline 10/31/20: Pt. continues to present with limited AROM for isolated  right shoulder flexion with pain. 08/08/20: R AROM  Shoulder flexion - 45* c 3/10 pain.    Time 12    Period Weeks    Status On-going    Target Date 12/05/20      OT LONG TERM GOAL #3   Title Pt will use RUE as a stabilizer/ gross A at least 30 % of  the time for ADLs/ IADLs.    Baseline Pt. is using her right hand as a gross stabilizer 15% of the time. 08/08/20: requires MAX cues    Time 12    Period Weeks    Status On-going    Target Date 12/05/20      OT LONG TERM GOAL #4   Title Pt will increase LUE grip strength to 25 lbs or greater for increased ease with ADLs.    Baseline 10/31/2020: Pt. continues to present with limited left grip. Pt. has improved with left grip strength. Pt.continues to work towards 25#. 08/08/20: L grip 19#    Time 12    Period Weeks    Status On-going    Target Date 12/05/20      OT LONG TERM GOAL #5   Title 10/31/2020: Pt. is now perfroming laundry, loading the dishwasher, assists some with cooking, light meal preparation, setting the table    Baseline Pt. assists with filling the dishwasher, and performing laundry. Pt. continues to try to help when she can, however her husband mostly performs these tasks. 08/08/20: Husband performs    Time 12    Period Weeks    Status On-going    Target Date 12/05/20      OT LONG TERM GOAL #6   Title Pt will demonstrate ability to grasp/ release a cup 2/3 trials with RUE.    Baseline 10/31/2020: Pt. continues to initiate grasp, and active release of objects, however is unable to grasp/release a cup. 08/08/20: Able to grasp, unable to release x1 trial    Time 12    Period Weeks    Status On-going    Target Date 12/05/20      OT LONG TERM GOAL #8   Title I with updated HEP.    Baseline 09/12/2020: Pt. conitinues to require assist for her HEP. Pt. requires assist, and cuing for HEP. 08/08/20: reports completing with husband assist for technique    Time 12    Period Weeks    Status Partially Met    Target Date 12/05/20      OT LONG TERM GOAL  #9   TITLE Pt. will use her  right hand to assist the left hand in searching for items in, and holding her purse.    Baseline Independent    Time 12    Period Weeks    Status Achieved      OT LONG TERM GOAL  #10   TITLE Pt. will use her right hand to assist the left to independently place eye glasses into a case.    Baseline Independent    Time 12    Period Weeks    Status Achieved                   Plan - 11/24/20 1803     Clinical Impression Statement Pt. continues to present with  2/10 pain in her right hand/thumb.  Pt. continues to tolerate moist heat prior to, and in conjunction with ROM. Pt. Tolerated ROM well today. Pt. continues to present with Improving active digit grasp, and release with her right thumb, 2nd, and 3rd digits. Support was gradually tapered off the RUE when reaching to place the discs onto the tabletop surface. Pt. continues to work on improving RUE ROM, and functional reaching in preparation for engaging her UE during ADLs, and IADL tasks. Pt. continues to work on improving RUE ROM, and functional hand use during ADLs, and IADL tasks.  OT Occupational Profile and History Detailed Assessment- Review of Records and additional review of physical, cognitive, psychosocial history related to current functional performance    Occupational performance deficits (Please refer to evaluation for details): ADL's;IADL's;Rest and Sleep;Work;Play;Leisure    Body Structure / Function / Physical Skills ADL;Balance;Coordination;Decreased knowledge of precautions;Decreased knowledge of use of DME;Dexterity;Edema;Mobility;Tone;Strength;IADL;Sensation;GMC;Gait;ROM;FMC;Flexibility;Pain;Vision;UE functional use;Endurance    Cognitive Skills Attention;Perception;Problem Solve;Safety Awareness;Thought    Rehab Potential Good    Clinical Decision Making Several treatment options, min-mod task modification necessary    Comorbidities Affecting Occupational Performance: None    Modification or Assistance to  Complete Evaluation  Min-Moderate modification of tasks or assist with assess necessary to complete eval    OT Frequency 3x / week    OT Duration 12 weeks    OT Treatment/Interventions Self-care/ADL training;Ultrasound;Energy conservation;Visual/perceptual remediation/compensation;Patient/family education;DME and/or AE instruction;Aquatic Therapy;Paraffin;Gait Training;Passive range of motion;Balance training;Fluidtherapy;Electrical Stimulation;Functional Mobility Training;Splinting;Moist Heat;Therapeutic exercise;Manual Therapy;Cognitive remediation/compensation;Manual lymph drainage;Neuromuscular education;Coping strategies training    Plan Pt is transferring her care to [facility] as it is closer to home. She remains very fearful of moving RUE and responds well to heat, and estim.    Consulted and Agree with Plan of Care Patient             Patient will benefit from skilled therapeutic intervention in order to improve the following deficits and impairments:   Body Structure / Function / Physical Skills: ADL, Balance, Coordination, Decreased knowledge of precautions, Decreased knowledge of use of DME, Dexterity, Edema, Mobility, Tone, Strength, IADL, Sensation, GMC, Gait, ROM, FMC, Flexibility, Pain, Vision, UE functional use, Endurance Cognitive Skills: Attention, Perception, Problem Solve, Safety Awareness, Thought     Visit Diagnosis: Muscle weakness (generalized)  Other lack of coordination    Problem List Patient Active Problem List   Diagnosis Date Noted   Dyspareunia in female 04/05/2020   History of abnormal cervical Pap smear 03/10/2020   GIB (gastrointestinal bleeding) 02/28/2020   Elevated BUN    Prediabetes    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   Cerebral vasospasm    Sinus tachycardia    Dysphagia, post-stroke    Thrombocytopenia ([facility])    Acute blood loss anemia    Brain aneurysm    ICH (intracerebral hemorrhage) ([place]) 01/05/2020   History of adenomatous  polyp of colon 02/07/2016   Anatomical narrow angle, bilateral 12/14/2014   Fissure in ano 11/11/2014   Hemorrhoids 11/11/2014   Constipation 11/19/2013   GERD (gastroesophageal reflux disease) 03/24/2013   Irritable bowel syndrome with diarrhea 03/24/2013   Herpes zoster 05/14/2005   Abnormal Pap smear of cervix 05/15/1983   History of cervical dysplasia 05/15/1983    Harrel Carina, MS, OTR/L 11/24/2020, 6:06 PM  [place] [address] [place], [place], [address] Phone: [phone]   Fax:  [phone]  Name: Erika Stanton MRN: [id number] Date of Birth: June 20, 1955

## 2020-11-24 NOTE — Therapy (Signed)
[place] MAIN [facility] SERVICES [address] [place], [place], [address] Phone: [phone]   Fax:  [phone]  Occupational Therapy Treatment  Patient Details  Name: Erika Stanton MRN: [id number] Date of Birth: 1955/08/03 Referring Provider (OT): Dr. Naaman Plummer   Encounter Date: 11/21/2020   OT End of Session - 11/23/20 0636     Visit Number 70    Number of Visits 16    Date for OT Re-Evaluation 12/05/20    Authorization Type Progress report period starting 09/21/2020    Authorization Time [place]    OT Start Time 6629    OT Stop Time 1345    OT Time Calculation (min) 47 min    Activity Tolerance Patient tolerated treatment well    Behavior During Therapy [facility] for tasks assessed/performed             Past Medical History:  Diagnosis Date   Aneurysm ([place])    Melena     Past Surgical History:  Procedure Laterality Date   CHOLECYSTECTOMY     COLONOSCOPY  11/2017   at [facility]. no recurrent polyps.  suggest repeat surveillance study 11/2022.     COLONOSCOPY W/ POLYPECTOMY  06/2014   Dr Roxy Manns at [facility].  3 adenomatoous polyps, anal fissure.     ESOPHAGOGASTRODUODENOSCOPY (EGD) WITH PROPOFOL N/A 01/27/2020   Procedure: ESOPHAGOGASTRODUODENOSCOPY (EGD) WITH PROPOFOL;  Surgeon: Mauri Pole, MD;  Location: [place] [facility];  Service: Endoscopy;  Laterality: N/A;   IR 3D INDEPENDENT WKST  01/06/2020   IR ANGIO INTRA EXTRACRAN SEL INTERNAL CAROTID BILAT MOD SED  01/06/2020   IR ANGIO VERTEBRAL SEL VERTEBRAL UNI L MOD SED  01/06/2020   IR ANGIOGRAM FOLLOW UP STUDY  01/06/2020   IR ANGIOGRAM FOLLOW UP STUDY  01/06/2020   IR ANGIOGRAM FOLLOW UP STUDY  01/06/2020   IR ANGIOGRAM FOLLOW UP STUDY  01/06/2020   IR ANGIOGRAM FOLLOW UP STUDY  01/06/2020   IR ANGIOGRAM FOLLOW UP STUDY  01/06/2020   IR ANGIOGRAM FOLLOW UP STUDY  01/06/2020   IR ANGIOGRAM FOLLOW UP STUDY  01/06/2020   IR ANGIOGRAM FOLLOW UP STUDY  01/06/2020   IR ANGIOGRAM FOLLOW UP  STUDY  01/06/2020   IR NEURO EACH ADD'L AFTER BASIC UNI RIGHT (MS)  01/06/2020   IR TRANSCATH/EMBOLIZ  01/06/2020   RADIOLOGY WITH ANESTHESIA N/A 01/06/2020   Procedure: IR WITH ANESTHESIA FOR ANEURYSM;  Surgeon: Consuella Lose, MD;  Location: [place];  Service: Radiology;  Laterality: N/A;   TUBAL LIGATION      There were no vitals filed for this visit.   Subjective Assessment - 11/24/20 0633     Subjective  Pt reports she has been doing well, would like to use the hand more and be able to sort through some collectibles.  "I may see if my friend will help me reorganize and decide how to get rid of some stuff.    Patient is accompanied by: Family member    Pertinent History ICH 01/06/20, Aneurysm. PMH: OA bilateral knees and hands    Repetition Decreases Symptoms    Patient Stated Goals Pt would like to be as independent as possible.    Currently in Pain? Yes    Pain Score 2     Pain Location Shoulder    Pain Orientation Right    Pain Descriptors / Indicators Aching;Tightness    Pain Type Chronic pain    Pain Onset More than a month ago  Pain Frequency Intermittent    Pain Onset More than a month ago            Applied moist heat to right shoulder, forearm, wrist and hand for 5 mins prior to therapeutic exercises to decrease pain and increase ROM. Pain 2/10 with movement.    Pt seen for PROM to RUE, shoulder flexion to 90 degrees, (increased pain greater than 90) ABD to 85, ER, elbow flexion/extension, supination/pronation, wrist flexion/extension, digit flexion.    RUE AAROM: Shoulder flexion/abduction,ER, elbow flexion/extension, supination/pronation, wrist ext/flex, digit adduction/abduction and tapping.  Pt  seen for grasp and release of PVC pipe pieces, attempts to place them together and pull apart.  Difficulty with maintaining grasp on item in right hand and required occasional assistance, would likely perform better with larger grip items.  Active reaching to move items  from her lap to the tabletop with the right hand.    Response to tx: Patient remains limited by pain at the shoulder with ROM, encouraged her to continue with exercises at home, engage in active tasks and have husband assist with ROM.  She has been working on active ER more over the last few days.  Continue to work towards goals to improve RUE functional use for ADL and IADL tasks at home and in the community.                      OT Education - 11/24/20 [phone]     Education Details self directed ROM, functional use of hand with grasping, review of ER    Person(s) Educated Patient    Methods Explanation;Demonstration;Tactile cues;Verbal cues    Comprehension Verbalized understanding;Returned demonstration;Verbal cues required              OT Short Term Goals - 10/31/20 1057       OT SHORT TERM GOAL #5   Title Pt will demonstrate improved LUE fine motor coordination for ADLs as evidenced by decreasing 9 hole peg test score to 25 secs or less.    Baseline 10/31/2020: Left FMC: 32 sec. Left FMC 41 sec visa the 9 hole peg test. 08/08/20: 32 seconds via 9 hole peg    Time 6    Period Weeks    Status On-going    Target Date 12/05/20               OT Long Term Goals - 10/31/20 1027       OT LONG TERM GOAL #1   Title Pt will perform all basic ADLs with supervision    Baseline 6/202/2022: Pt. is able to donn stretchy shirts, donn slide on shoes, donning pants. Pt. has difficylty with managing a bra,shirt buttons, and pant zipper/button.Pt. requires minA from her husband following multiple recent surgeries. Pt. requires minA from her husband. 08/08/20: Pt continues to require MIN A for grooming and dressing tasks.    Time 12    Period Weeks    Status On-going    Target Date 12/05/20      OT LONG TERM GOAL #2   Title Pt will demonstrate 60* RUE shoulder flexion in prep for functional reach with pain less than or equal to 3/10.    Baseline 10/31/20: Pt. continues to  present with limited AROM for isolated  right shoulder flexion with pain. 08/08/20: R AROM Shoulder flexion - 45* c 3/10 pain.    Time 12    Period Weeks    Status On-going    Target  Date 12/05/20      OT LONG TERM GOAL #3   Title Pt will use RUE as a stabilizer/ gross A at least 30 % of the time for ADLs/ IADLs.    Baseline Pt. is using her right hand as a gross stabilizer 15% of the time. 08/08/20: requires MAX cues    Time 12    Period Weeks    Status On-going    Target Date 12/05/20      OT LONG TERM GOAL #4   Title Pt will increase LUE grip strength to 25 lbs or greater for increased ease with ADLs.    Baseline 10/31/2020: Pt. continues to present with limited left grip. Pt. has improved with left grip strength. Pt.continues to work towards 25#. 08/08/20: L grip 19#    Time 12    Period Weeks    Status On-going    Target Date 12/05/20      OT LONG TERM GOAL #5   Title 10/31/2020: Pt. is now perfroming laundry, loading the dishwasher, assists some with cooking, light meal preparation, setting the table    Baseline Pt. assists with filling the dishwasher, and performing laundry. Pt. continues to try to help when she can, however her husband mostly performs these tasks. 08/08/20: Husband performs    Time 12    Period Weeks    Status On-going    Target Date 12/05/20      OT LONG TERM GOAL #6   Title Pt will demonstrate ability to grasp/ release a cup 2/3 trials with RUE.    Baseline 10/31/2020: Pt. continues to initiate grasp, and active release of objects, however is unable to grasp/release a cup. 08/08/20: Able to grasp, unable to release x1 trial    Time 12    Period Weeks    Status On-going    Target Date 12/05/20      OT LONG TERM GOAL #8   Title I with updated HEP.    Baseline 09/12/2020: Pt. conitinues to require assist for her HEP. Pt. requires assist, and cuing for HEP. 08/08/20: reports completing with husband assist for technique    Time 12    Period Weeks    Status  Partially Met    Target Date 12/05/20      OT LONG TERM GOAL  #9   TITLE Pt. will use her right hand to assist the left hand in searching for items in, and holding her purse.    Baseline Independent    Time 12    Period Weeks    Status Achieved      OT LONG TERM GOAL  #10   TITLE Pt. will use her right hand to assist the left to independently place eye glasses into a case.    Baseline Independent    Time 12    Period Weeks    Status Achieved                   Plan - 11/23/20 0636     Clinical Impression Statement atient remains limited by pain at the shoulder with ROM, encouraged her to continue with exercises at home, engage in active tasks and have husband assist with ROM.  She has been working on active ER more over the last few days.  Continue to work towards goals to improve RUE functional use for ADL and IADL tasks at home and in the community.    OT Occupational Profile and History Detailed Assessment- Review of Records and  additional review of physical, cognitive, psychosocial history related to current functional performance    Occupational performance deficits (Please refer to evaluation for details): ADL's;IADL's;Rest and Sleep;Work;Play;Leisure    Body Structure / Function / Physical Skills ADL;Balance;Coordination;Decreased knowledge of precautions;Decreased knowledge of use of DME;Dexterity;Edema;Mobility;Tone;Strength;IADL;Sensation;GMC;Gait;ROM;FMC;Flexibility;Pain;Vision;UE functional use;Endurance    Cognitive Skills Attention;Perception;Problem Solve;Safety Awareness;Thought    Rehab Potential Good    Clinical Decision Making Several treatment options, min-mod task modification necessary    Comorbidities Affecting Occupational Performance: None    Modification or Assistance to Complete Evaluation  Min-Moderate modification of tasks or assist with assess necessary to complete eval    OT Frequency 3x / week    OT Duration 12 weeks    OT  Treatment/Interventions Self-care/ADL training;Ultrasound;Energy conservation;Visual/perceptual remediation/compensation;Patient/family education;DME and/or AE instruction;Aquatic Therapy;Paraffin;Gait Training;Passive range of motion;Balance training;Fluidtherapy;Electrical Stimulation;Functional Mobility Training;Splinting;Moist Heat;Therapeutic exercise;Manual Therapy;Cognitive remediation/compensation;Manual lymph drainage;Neuromuscular education;Coping strategies training    Consulted and Agree with Plan of Care Patient    Family Member Consulted Spose present during the session.             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, unspecified laterality (HCC)  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HCC)  Acute pain of right shoulder    Problem List Patient Active Problem List   Diagnosis Date Noted   Dyspareunia in female 04/05/2020   History of abnormal cervical Pap smear 03/10/2020   GIB (gastrointestinal bleeding) 02/28/2020   Elevated BUN    Prediabetes    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   Cerebral vasospasm    Sinus tachycardia    Dysphagia, post-stroke    Thrombocytopenia (HCC)    Acute blood loss anemia    Brain aneurysm    ICH (intracerebral hemorrhage) ([place]) 01/05/2020   History of adenomatous polyp of colon 02/07/2016   Anatomical narrow angle, bilateral 12/14/2014    Fissure in ano 11/11/2014   Hemorrhoids 11/11/2014   Constipation 11/19/2013   GERD (gastroesophageal reflux disease) 03/24/2013   Irritable bowel syndrome with diarrhea 03/24/2013   Herpes zoster 05/14/2005   Abnormal Pap smear of cervix 05/15/1983   History of cervical dysplasia 05/15/1983   Keaghan Staton T Adalind Weitz, OTR/L, CLT  Elaysha Bevard 11/24/2020, 8:23 AM  [place] [address] [place], [place], [address] Phone: [phone]   Fax:  [phone]  Name: Erika Stanton MRN: [id number] Date of Birth: 11/21/1955

## 2020-11-28 ENCOUNTER — Ambulatory Visit: Payer: BC Managed Care – PPO

## 2020-11-28 ENCOUNTER — Other Ambulatory Visit: Payer: Self-pay

## 2020-11-28 ENCOUNTER — Encounter: Payer: Self-pay | Admitting: Psychology

## 2020-11-28 ENCOUNTER — Encounter: Payer: BC Managed Care – PPO | Attending: Physical Medicine and Rehabilitation | Admitting: Psychology

## 2020-11-28 DIAGNOSIS — I69819 Unspecified symptoms and signs involving cognitive functions following other cerebrovascular disease: Secondary | ICD-10-CM | POA: Insufficient documentation

## 2020-11-28 DIAGNOSIS — R252 Cramp and spasm: Secondary | ICD-10-CM | POA: Insufficient documentation

## 2020-11-28 DIAGNOSIS — M25511 Pain in right shoulder: Secondary | ICD-10-CM

## 2020-11-28 DIAGNOSIS — I69398 Other sequelae of cerebral infarction: Secondary | ICD-10-CM | POA: Insufficient documentation

## 2020-11-28 DIAGNOSIS — I69159 Hemiplegia and hemiparesis following nontraumatic intracerebral hemorrhage affecting unspecified side: Secondary | ICD-10-CM

## 2020-11-28 DIAGNOSIS — M79641 Pain in right hand: Secondary | ICD-10-CM

## 2020-11-28 DIAGNOSIS — M6281 Muscle weakness (generalized): Secondary | ICD-10-CM | POA: Diagnosis not present

## 2020-11-28 DIAGNOSIS — F411 Generalized anxiety disorder: Secondary | ICD-10-CM | POA: Diagnosis present

## 2020-11-28 DIAGNOSIS — R278 Other lack of coordination: Secondary | ICD-10-CM

## 2020-11-28 NOTE — Progress Notes (Signed)
Neuropsychological Consultation   Patient:   Erika Stanton   DOB:   1955-07-25  MR Number:  [id number]  Location:  [facility] [place], [place] [id number] [place] [id number] Dept: [phone]           Date of Service:   11/28/2020  Start Time:   1 PM End Time:   2 PM  Provider/Observer:  [name], Psy.D.       Clinical Neuropsychologist       Billing Code/Service: 96116/96121  Chief Complaint:    Erika Stanton is a 65 year old female referred by Leeroy Cha, MD who is her treating physiatrist due to residual effects of cerebrovascular that occurred on 01/05/2020.  The patient initially developed right-sided headache and left hemiparesis.  CT head showed right basal ganglia hemorrhage with small amount of subarachnoid hemorrhage and bilateral IVH.  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  Patient underwent stent supported embolism of right ICA aneurysm on 01/06/2020.  Postoperative lethargy was noted.  EEG showed evidence of epileptogenic arising from right temporal region as well as severe diffuse encephalopathy but no seizures.  Patient became obtunded and follow-up MRI done revealing acute cortical and subcortical infarct affecting the left anterior frontal and left parietal regions as well as punctuate micro emboli in the right frontal and parietal regions.  Patient had initial significant cognitive deficits with disorientation, and unintelligible speech and bouts of lethargy.  Patient was followed up with CIR admission and I saw her 1 time on the unit.  Patient has been followed by physiatry and neurology as well as neurosurgery for coiling of other aneurysms and has a plan for revision of initial repair coming up.  Patient also has other significant medical issue related to ductal  carcinoma of the left breast and status postlumpectomy of the left breast.  Reason for Service:  Erika OSTENSON is a 65 year old female referred by Leeroy Cha, MD who is her treating physiatrist due to residual effects of cerebrovascular that occurred on 01/05/2020.  The patient initially developed right-sided headache and left hemiparesis.  CT head showed right basal ganglia hemorrhage with small amount of subarachnoid hemorrhage and bilateral IVH.  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  Patient underwent stent supported embolism of right ICA aneurysm on 01/06/2020.  Postoperative lethargy was noted.  EEG showed evidence of epileptogenic arising from right temporal region as well as severe diffuse encephalopathy but no seizures.  Patient became obtunded and follow-up MRI done revealing acute cortical and subcortical infarct affecting the left anterior frontal and left parietal regions as well as punctuate micro emboli in the right frontal and parietal regions.  Patient had initial significant cognitive deficits with disorientation, and unintelligible speech and bouts of lethargy.  Patient was followed up with CIR admission and I saw her 1 time on the unit.  Patient has been followed by physiatry and neurology as well as neurosurgery for coiling of other aneurysms and has a plan for revision of initial repair coming up.  Patient also has other significant medical issue related to ductal carcinoma of the left breast and status postlumpectomy of the left breast.  The patient and her husband presented today for neuropsychological clinical interview and the patient reports  that she is still showing significant improvement in overall cognitive functioning but has residual right arm issues.  She reports that there is some continued weakness in the left arm and leg but those have significantly improved.  The patient continues to  walk and do her exercises but has significant issues with anxiety around her recovery and anxiety associated with her ongoing issues.  The patient reports that she has some memory and processing issues but is mainly her motor functions in her arm and hand that bother her the most.  She reports that she gets very emotional with everything that is happening and now has concerns about the upcoming neurosurgical interventions as well as treatment for her breast cancer.  The patient's husband reports that he is still seeing issues with problem-solving but she has made significant recovery.  He reports that she was always a very vibrant and intelligent person but now she gets very emotional and cries at things easily.  She has improved so much but they wanted to work on issues regarding her mood disturbance and anxiety.  Behavioral Observation: [name]  presents as a 65 y.o.-year-old Right Caucasian Female who appeared her stated age. her dress was Appropriate and she was Well Groomed and her manners were Appropriate to the situation.  her participation was indicative of Appropriate and Attentive behaviors.  There were physical disabilities noted.  she displayed an appropriate level of cooperation and motivation.     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  Substance Use:  No concerns of substance  abuse are reported.  Education:   College  Medical History:   Past Medical History:  Diagnosis Date   Aneurysm ([place])    Melena          Patient Active Problem List   Diagnosis Date Noted   Dyspareunia in female 04/05/2020   History of abnormal cervical Pap smear 03/10/2020   GIB (gastrointestinal bleeding) 02/28/2020   Elevated BUN    Prediabetes    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[place]) 01/20/2020   Cerebral vasospasm    Sinus tachycardia    Dysphagia, post-stroke    Thrombocytopenia (HCC)    Acute blood loss anemia    Brain aneurysm    ICH (intracerebral hemorrhage) ([place]) 01/05/2020   History of adenomatous polyp of colon 02/07/2016   Anatomical narrow angle, bilateral 12/14/2014   Fissure in ano 11/11/2014   Hemorrhoids 11/11/2014   Constipation 11/19/2013   GERD (gastroesophageal reflux disease) 03/24/2013   Irritable bowel syndrome with diarrhea 03/24/2013   Herpes zoster 05/14/2005   Abnormal Pap smear of cervix 05/15/1983   History of cervical dysplasia 05/15/1983         Psychiatric History:  No prior psychiatric history  Family Med/Psych History:  Family History  Problem Relation Age of Onset   Cancer  Mother    Congestive Heart Failure Mother    Lung cancer Father    Lung cancer Sister     Impression/DX:  HAYLEN BELLOTTI is a 65 year old female referred by Leeroy Cha, MD who is her treating physiatrist due to residual effects of cerebrovascular that occurred on 01/05/2020.  The patient initially developed right-sided headache and left hemiparesis.  CT head showed right basal ganglia hemorrhage with small amount of subarachnoid hemorrhage and bilateral IVH.  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  Patient underwent stent supported embolism of right ICA aneurysm on 01/06/2020.  Postoperative lethargy was noted.  EEG showed evidence of epileptogenic arising from right temporal  region as well as severe diffuse encephalopathy but no seizures.  Patient became obtunded and follow-up MRI done revealing acute cortical and subcortical infarct affecting the left anterior frontal and left parietal regions as well as punctuate micro emboli in the right frontal and parietal regions.  Patient had initial significant cognitive deficits with disorientation, and unintelligible speech and bouts of lethargy.  Patient was followed up with CIR admission and I saw her 1 time on the unit.  Patient has been followed by physiatry and neurology as well as neurosurgery for coiling of other aneurysms and has a plan for revision of initial repair coming up.  Patient also has other significant medical issue related to ductal carcinoma of the left breast and status postlumpectomy of the left breast.  Disposition/Plan:  At this point, I am not sure if more in-depth neuropsychological testing would be helpful as her deficits are apparent and she is making significant progress.  However, the patient and her husband both describe the biggest issue they are having is related to her coping with motor deficits and some mild residual cognitive deficits.  Increasing issues related to anxiety and coping are present.  We have set the patient up for formal therapeutic interventions around issues related to coping and anxiety.  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 Clinical Neuropsychologist

## 2020-11-28 NOTE — Therapy (Signed)
[place] MAIN [facility] SERVICES [address] [place], [place], [address] Phone: [phone]   Fax:  [phone]  Occupational Therapy Treatment  Patient Details  Name: Erika Stanton MRN: [id number] Date of Birth: 1955/11/07 Referring Provider (OT): Dr. Naaman Plummer   Encounter Date: 11/28/2020   OT End of Session - 11/28/20 1129     Visit Number 54    Number of Visits 25    Date for OT Re-Evaluation 12/05/20    Authorization Type Progress report period starting 09/21/2020    Authorization Time [place]    OT Start Time 1016    OT Stop Time 1059    OT Time Calculation (min) 43 min    Activity Tolerance Patient tolerated treatment well    Behavior During Therapy [facility] for tasks assessed/performed             Past Medical History:  Diagnosis Date   Aneurysm ([place])    Melena     Past Surgical History:  Procedure Laterality Date   CHOLECYSTECTOMY     COLONOSCOPY  11/2017   at [facility]. no recurrent polyps.  suggest repeat surveillance study 11/2022.     COLONOSCOPY W/ POLYPECTOMY  06/2014   Dr Roxy Manns at [facility].  3 adenomatoous polyps, anal fissure.     ESOPHAGOGASTRODUODENOSCOPY (EGD) WITH PROPOFOL N/A 01/27/2020   Procedure: ESOPHAGOGASTRODUODENOSCOPY (EGD) WITH PROPOFOL;  Surgeon: Mauri Pole, MD;  Location: [facility];  Service: Endoscopy;  Laterality: N/A;   IR 3D INDEPENDENT WKST  01/06/2020   IR ANGIO INTRA EXTRACRAN SEL INTERNAL CAROTID BILAT MOD SED  01/06/2020   IR ANGIO VERTEBRAL SEL VERTEBRAL UNI L MOD SED  01/06/2020   IR ANGIOGRAM FOLLOW UP STUDY  01/06/2020   IR ANGIOGRAM FOLLOW UP STUDY  01/06/2020   IR ANGIOGRAM FOLLOW UP STUDY  01/06/2020   IR ANGIOGRAM FOLLOW UP STUDY  01/06/2020   IR ANGIOGRAM FOLLOW UP STUDY  01/06/2020   IR ANGIOGRAM FOLLOW UP STUDY  01/06/2020   IR ANGIOGRAM FOLLOW UP STUDY  01/06/2020   IR ANGIOGRAM FOLLOW UP STUDY  01/06/2020   IR ANGIOGRAM FOLLOW UP STUDY  01/06/2020   IR ANGIOGRAM FOLLOW UP  STUDY  01/06/2020   IR NEURO EACH ADD'L AFTER BASIC UNI RIGHT (MS)  01/06/2020   IR TRANSCATH/EMBOLIZ  01/06/2020   RADIOLOGY WITH ANESTHESIA N/A 01/06/2020   Procedure: IR WITH ANESTHESIA FOR ANEURYSM;  Surgeon: Consuella Lose, MD;  Location: [facility];  Service: Radiology;  Laterality: N/A;   TUBAL LIGATION      There were no vitals filed for this visit.   Subjective Assessment - 11/28/20 1127     Subjective  Pt reports that she's hoping to go to the beach next week with a girlfriend for a few days.    Patient is accompanied by: Family member    Pertinent History ICH 01/06/20, Aneurysm. PMH: OA bilateral knees and hands    Repetition Decreases Symptoms    Patient Stated Goals Pt would like to be as independent as possible.    Currently in Pain? Yes    Pain Score 1     Pain Location Arm    Pain Orientation Right    Pain Descriptors / Indicators Aching;Tightness;Sore    Pain Type Chronic pain    Pain Radiating Towards hand    Pain Onset More than a month ago    Pain Frequency Intermittent    Aggravating Factors  cold weather, activity  Pain Relieving Factors rest/heat    Effect of Pain on Daily Activities decreased activity tolerance when using RUE    Multiple Pain Sites No    Pain Onset More than a month ago            Occupational Therapy Treatment: Neuro re-ed: Pt participated in activities to facilitate [place] coordination.  Used small pvc pipe tower, using RUE to pick up pvc pipes from basket, and used L hand to [employer] while R hand worked to connect [employer] together on tower.  Occasional need for LUE to take over when pt had difficulty connecting pipes, but pt was always able to incorporate RUE to use as a stabilizer.  Utilized velcro board to roll dowels forward and backward to facilitate use of hand and elbow flex/ext with cues to relax R shoulder and minimize shoulder hike.  Max A to push small dowel forward to facilitate digit extension, min A for digit flexion.   Min a to turn larger dowel to simulate turning a key, with vc for coordination of movements throughout hand and forearm.  Intermittent passive stretching throughout RUE, primarily R shoulder, wrist, and digits to reduce stiffness and distal flexor tone.   Response to Treatment: Pt reports that she continues to try and use RUE for activities at home, but she verbalizes that she is limited by frustration.  OT encouraged pt work towards use of RUE when she is not in a hurry, in order to allow frequent rest breaks for stretching hand to minimize tone.  Pt states that she uses her R hand to try and pick up things at home everyday, and uses it to fold laundry.  Pt continues to be limited by pain in R shoulder and hand, distal flexor tone in RUE, and R sided hemiparesis.  Pt will benefit from continued Occupational Therapy to address deficits noted above in order to maximize indep with ADLs/IADLs//leisure.     OT Education - 11/28/20 1128     Education Details work on functional use of RUE at home at times when pt is not rushed; allow rest breaks to stretch hand to reduce tone    Person(s) Educated Patient    Methods Explanation;Demonstration;Tactile cues;Verbal cues    Comprehension Verbalized understanding;Returned demonstration;Verbal cues required              OT Short Term Goals - 10/31/20 1057       OT SHORT TERM GOAL #5   Title Pt will demonstrate improved LUE fine motor coordination for ADLs as evidenced by decreasing 9 hole peg test score to 25 secs or less.    Baseline 10/31/2020: Left FMC: 32 sec. Left FMC 41 sec visa the 9 hole peg test. 08/08/20: 32 seconds via 9 hole peg    Time 6    Period Weeks    Status On-going    Target Date 12/05/20               OT Long Term Goals - 10/31/20 1027       OT LONG TERM GOAL #1   Title Pt will perform all basic ADLs with supervision    Baseline 6/202/2022: Pt. is able to donn stretchy shirts, donn slide on shoes, donning pants. Pt.  has difficylty with managing a bra,shirt buttons, and pant zipper/button.Pt. requires minA from her husband following multiple recent surgeries. Pt. requires minA from her husband. 08/08/20: Pt continues to require MIN A for grooming and dressing tasks.    Time  12    Period Weeks    Status On-going    Target Date 12/05/20      OT LONG TERM GOAL #2   Title Pt will demonstrate 60* RUE shoulder flexion in prep for functional reach with pain less than or equal to 3/10.    Baseline 10/31/20: Pt. continues to present with limited AROM for isolated  right shoulder flexion with pain. 08/08/20: R AROM Shoulder flexion - 45* c 3/10 pain.    Time 12    Period Weeks    Status On-going    Target Date 12/05/20      OT LONG TERM GOAL #3   Title Pt will use RUE as a stabilizer/ gross A at least 30 % of the time for ADLs/ IADLs.    Baseline Pt. is using her right hand as a gross stabilizer 15% of the time. 08/08/20: requires MAX cues    Time 12    Period Weeks    Status On-going    Target Date 12/05/20      OT LONG TERM GOAL #4   Title Pt will increase LUE grip strength to 25 lbs or greater for increased ease with ADLs.    Baseline 10/31/2020: Pt. continues to present with limited left grip. Pt. has improved with left grip strength. Pt.continues to work towards 25#. 08/08/20: L grip 19#    Time 12    Period Weeks    Status On-going    Target Date 12/05/20      OT LONG TERM GOAL #5   Title 10/31/2020: Pt. is now perfroming laundry, loading the dishwasher, assists some with cooking, light meal preparation, setting the table    Baseline Pt. assists with filling the dishwasher, and performing laundry. Pt. continues to try to help when she can, however her husband mostly performs these tasks. 08/08/20: Husband performs    Time 12    Period Weeks    Status On-going    Target Date 12/05/20      OT LONG TERM GOAL #6   Title Pt will demonstrate ability to grasp/ release a cup 2/3 trials with RUE.    Baseline  10/31/2020: Pt. continues to initiate grasp, and active release of objects, however is unable to grasp/release a cup. 08/08/20: Able to grasp, unable to release x1 trial    Time 12    Period Weeks    Status On-going    Target Date 12/05/20      OT LONG TERM GOAL #8   Title I with updated HEP.    Baseline 09/12/2020: Pt. conitinues to require assist for her HEP. Pt. requires assist, and cuing for HEP. 08/08/20: reports completing with husband assist for technique    Time 12    Period Weeks    Status Partially Met    Target Date 12/05/20      OT LONG TERM GOAL  #9   TITLE Pt. will use her right hand to assist the left hand in searching for items in, and holding her purse.    Baseline Independent    Time 12    Period Weeks    Status Achieved      OT LONG TERM GOAL  #10   TITLE Pt. will use her right hand to assist the left to independently place eye glasses into a case.    Baseline Independent    Time 12    Period Weeks    Status Achieved  Plan - 11/28/20 1143     Clinical Impression Statement Pt reports that she continues to try and use RUE for activities at home, but she verbalizes that she is limited by frustration.  OT encouraged pt work towards use of RUE when she is not in a hurry, in order to allow frequent rest breaks for stretching hand to minimize tone.  Pt states that she uses her R hand to try and pick up things at home everyday, and uses it to fold laundry.  Pt continues to be limited by pain in R shoulder and hand, distal flexor tone in RUE, and R sided hemiparesis.  Pt will benefit from continued Occupational Therapy to address deficits noted above in order to maximize indep with ADLs/IADLs//leisure.    OT Occupational Profile and History Detailed Assessment- Review of Records and additional review of physical, cognitive, psychosocial history related to current functional performance    Occupational performance deficits (Please refer to evaluation  for details): ADL's;IADL's;Rest and Sleep;Work;Play;Leisure    Body Structure / Function / Physical Skills ADL;Balance;Coordination;Decreased knowledge of precautions;Decreased knowledge of use of DME;Dexterity;Edema;Mobility;Tone;Strength;IADL;Sensation;GMC;Gait;ROM;FMC;Flexibility;Pain;Vision;UE functional use;Endurance    Cognitive Skills Attention;Perception;Problem Solve;Safety Awareness;Thought    Rehab Potential Good    Clinical Decision Making Several treatment options, min-mod task modification necessary    Comorbidities Affecting Occupational Performance: None    Modification or Assistance to Complete Evaluation  Min-Moderate modification of tasks or assist with assess necessary to complete eval    OT Frequency 3x / week    OT Duration 12 weeks    OT Treatment/Interventions Self-care/ADL training;Ultrasound;Energy conservation;Visual/perceptual remediation/compensation;Patient/family education;DME and/or AE instruction;Aquatic Therapy;Paraffin;Gait Training;Passive range of motion;Balance training;Fluidtherapy;Electrical Stimulation;Functional Mobility Training;Splinting;Moist Heat;Therapeutic exercise;Manual Therapy;Cognitive remediation/compensation;Manual lymph drainage;Neuromuscular education;Coping strategies training    Plan Pt is transferring her care to [facility] OP as it is closer to home. She remains very fearful of moving RUE and responds well to heat, and estim.    Consulted and Agree with Plan of Care Patient             Patient will benefit from skilled therapeutic intervention in order to improve the following deficits and impairments:   Body Structure / Function / Physical Skills: ADL, Balance, Coordination, Decreased knowledge of precautions, Decreased knowledge of use of DME, Dexterity, Edema, Mobility, Tone, Strength, IADL, Sensation, GMC, Gait, ROM, FMC, Flexibility, Pain, Vision, UE functional use, Endurance Cognitive Skills: Attention, Perception, Problem Solve, Safety  Awareness, Thought     Visit Diagnosis: Muscle weakness (generalized)  Other lack of coordination  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, unspecified laterality (HCC)  Acute pain of right shoulder  Pain in right hand    Problem List Patient Active Problem List   Diagnosis Date Noted   Dyspareunia in female 04/05/2020   History of abnormal cervical Pap smear 03/10/2020   GIB (gastrointestinal bleeding) 02/28/2020   Elevated BUN    Prediabetes    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[place]) 01/20/2020   Cerebral vasospasm    Sinus tachycardia    Dysphagia, post-stroke    Thrombocytopenia (HCC)    Acute blood loss anemia    Brain aneurysm    ICH (intracerebral hemorrhage) ([place]) 01/05/2020   History of adenomatous polyp of colon 02/07/2016   Anatomical narrow angle, bilateral 12/14/2014   Fissure in ano 11/11/2014   Hemorrhoids 11/11/2014   Constipation 11/19/2013   GERD (gastroesophageal reflux disease) 03/24/2013   Irritable bowel syndrome with diarrhea 03/24/2013   Herpes zoster 05/14/2005   Abnormal Pap smear of cervix  05/15/1983   History of cervical dysplasia 05/15/1983   Leta Speller, MS, OTR/L  Darleene Cleaver 11/28/2020, 11:45 AM  [place] MAIN [facility] SERVICES [address] [place], [place], [address] Phone: [phone]   Fax:  [phone]  Name: RAINBOW SALMAN MRN: [id number] Date of Birth: [birth year]

## 2020-11-29 ENCOUNTER — Ambulatory Visit: Payer: BC Managed Care – PPO | Admitting: Occupational Therapy

## 2020-11-29 DIAGNOSIS — M6281 Muscle weakness (generalized): Secondary | ICD-10-CM | POA: Diagnosis not present

## 2020-11-29 DIAGNOSIS — R278 Other lack of coordination: Secondary | ICD-10-CM

## 2020-11-29 DIAGNOSIS — M25511 Pain in right shoulder: Secondary | ICD-10-CM

## 2020-11-29 DIAGNOSIS — I69159 Hemiplegia and hemiparesis following nontraumatic intracerebral hemorrhage affecting unspecified side: Secondary | ICD-10-CM

## 2020-11-30 ENCOUNTER — Encounter: Payer: Self-pay | Admitting: Occupational Therapy

## 2020-11-30 NOTE — Therapy (Signed)
[place] [facility] SERVICES [address] [place], [place], [address] Phone: [phone]   Fax:  [phone]  Occupational Therapy Treatment  Patient Details  Name: Erika Stanton MRN: [id number] Date of Birth: 1955/07/16 Referring Provider (OT): Dr. Naaman Plummer   Encounter Date: 11/29/2020   OT End of Session - 11/30/20 1957     Visit Number 58    Number of Visits 46    Date for OT Re-Evaluation 12/05/20    Authorization Type Progress report period starting 09/21/2020    Authorization Time Period State [employer]    OT Start Time 1300    OT Stop Time 1345    OT Time Calculation (min) 45 min    Activity Tolerance Patient tolerated treatment well    Behavior During Therapy [facility] for tasks assessed/performed             Past Medical History:  Diagnosis Date   Aneurysm ([place])    Melena     Past Surgical History:  Procedure Laterality Date   CHOLECYSTECTOMY     COLONOSCOPY  11/2017   at [facility] no recurrent polyps.  suggest repeat surveillance study 11/2022.     COLONOSCOPY W/ POLYPECTOMY  06/2014   Dr Roxy Manns at [facility].  3 adenomatoous polyps, anal fissure.     ESOPHAGOGASTRODUODENOSCOPY (EGD) WITH PROPOFOL N/A 01/27/2020   Procedure: ESOPHAGOGASTRODUODENOSCOPY (EGD) WITH PROPOFOL;  Surgeon: Mauri Pole, MD;  Location: [facility];  Service: Endoscopy;  Laterality: N/A;   IR 3D INDEPENDENT WKST  01/06/2020   IR ANGIO INTRA EXTRACRAN SEL INTERNAL CAROTID BILAT MOD SED  01/06/2020   IR ANGIO VERTEBRAL SEL VERTEBRAL UNI L MOD SED  01/06/2020   IR ANGIOGRAM FOLLOW UP STUDY  01/06/2020   IR ANGIOGRAM FOLLOW UP STUDY  01/06/2020   IR ANGIOGRAM FOLLOW UP STUDY  01/06/2020   IR ANGIOGRAM FOLLOW UP STUDY  01/06/2020   IR ANGIOGRAM FOLLOW UP STUDY  01/06/2020   IR ANGIOGRAM FOLLOW UP STUDY  01/06/2020   IR ANGIOGRAM FOLLOW UP STUDY  01/06/2020   IR ANGIOGRAM FOLLOW UP STUDY  01/06/2020   IR ANGIOGRAM FOLLOW UP STUDY  01/06/2020   IR ANGIOGRAM FOLLOW UP  STUDY  01/06/2020   IR NEURO EACH ADD'L AFTER BASIC UNI RIGHT (MS)  01/06/2020   IR TRANSCATH/EMBOLIZ  01/06/2020   RADIOLOGY WITH ANESTHESIA N/A 01/06/2020   Procedure: IR WITH ANESTHESIA FOR ANEURYSM;  Surgeon: Consuella Lose, MD;  Location: [place];  Service: Radiology;  Laterality: N/A;   TUBAL LIGATION      There were no vitals filed for this visit.   Subjective Assessment - 11/30/20 1956     Subjective  Pt reports that she's hoping to go to the beach next week with a girlfriend for a few days.    Patient is accompanied by: Family member    Pertinent History ICH 01/06/20, Aneurysm. PMH: OA bilateral knees and hands    Repetition Decreases Symptoms    Patient Stated Goals Pt would like to be as independent as possible.    Currently in Pain? Yes    Pain Score 1     Pain Location Arm    Pain Orientation Right    Pain Descriptors / Indicators Aching    Pain Type Chronic pain    Pain Onset More than a month ago    Pain Frequency Intermittent    Pain Onset More than a month ago  Moist heat to right shoulder, wrist and hand for 5 mins prior to therapeutic exercise to decrease pain and increase motion.  Therapeutic Exercise: Patient seen for PROM to RUE for shoulder flexion to 95 degrees, ABD to 90, ER,  elbow flexion/extension, forearm supination/pronation, wrist flexion/extension, digit flexion/extension followed by AAROM for above motions.  Therapist guiding as needed and working towards facilitation of active movement.  `Reaching tasks to table top height with RUE, Cues to decrease compensatory movement patterns at the shoulder with reaching activities.   ADL:   Patient seen for manipulation of medication bottles of a variety of sizes, varying tops, some with child safety locks and others without.  Cues for grasping patterns with right hand at times.  Pt attempting at times to use right hand to open bottles however has more difficulty with child safety lids and has to  switch to use right hand more as a stabilizer.    Response to tx: Pt. Requires cues for movements at the shoulder with reaching tasks to decrease compensatory movement patterns and facilitate normal movement.  Pt continues to have tightness at the shoulder with flexion, ABD and ER. Difficulty with managing access to medication bottles especially those with child safety lids.  Cues for grasping patterns and utilizing available motion and strength to her advantage.  Continue to encourage improving hand to mouth patterns for self feeding and grooming tasks.                         OT Education - 11/30/20 1957     Education Details encouraged hand use, self feeding tasks, reaching to wash face    Person(s) Educated Patient    Methods Explanation;Demonstration;Tactile cues;Verbal cues    Comprehension Verbalized understanding;Returned demonstration;Verbal cues required              OT Short Term Goals - 10/31/20 1057       OT SHORT TERM GOAL #5   Title Pt will demonstrate improved LUE fine motor coordination for ADLs as evidenced by decreasing 9 hole peg test score to 25 secs or less.    Baseline 10/31/2020: Left FMC: 32 sec. Left FMC 41 sec visa the 9 hole peg test. 08/08/20: 32 seconds via 9 hole peg    Time 6    Period Weeks    Status On-going    Target Date 12/05/20               OT Long Term Goals - 10/31/20 1027       OT LONG TERM GOAL #1   Title Pt will perform all basic ADLs with supervision    Baseline 6/202/2022: Pt. is able to donn stretchy shirts, donn slide on shoes, donning pants. Pt. has difficylty with managing a bra,shirt buttons, and pant zipper/button.Pt. requires minA from her husband following multiple recent surgeries. Pt. requires minA from her husband. 08/08/20: Pt continues to require MIN A for grooming and dressing tasks.    Time 12    Period Weeks    Status On-going    Target Date 12/05/20      OT LONG TERM GOAL #2   Title Pt will  demonstrate 60* RUE shoulder flexion in prep for functional reach with pain less than or equal to 3/10.    Baseline 10/31/20: Pt. continues to present with limited AROM for isolated  right shoulder flexion with pain. 08/08/20: R AROM Shoulder flexion - 45* c 3/10 pain.    Time  12    Period Weeks    Status On-going    Target Date 12/05/20      OT LONG TERM GOAL #3   Title Pt will use RUE as a stabilizer/ gross A at least 30 % of the time for ADLs/ IADLs.    Baseline Pt. is using her right hand as a gross stabilizer 15% of the time. 08/08/20: requires MAX cues    Time 12    Period Weeks    Status On-going    Target Date 12/05/20      OT LONG TERM GOAL #4   Title Pt will increase LUE grip strength to 25 lbs or greater for increased ease with ADLs.    Baseline 10/31/2020: Pt. continues to present with limited left grip. Pt. has improved with left grip strength. Pt.continues to work towards 25#. 08/08/20: L grip 19#    Time 12    Period Weeks    Status On-going    Target Date 12/05/20      OT LONG TERM GOAL #5   Title 10/31/2020: Pt. is now perfroming laundry, loading the dishwasher, assists some with cooking, light meal preparation, setting the table    Baseline Pt. assists with filling the dishwasher, and performing laundry. Pt. continues to try to help when she can, however her husband mostly performs these tasks. 08/08/20: Husband performs    Time 12    Period Weeks    Status On-going    Target Date 12/05/20      OT LONG TERM GOAL #6   Title Pt will demonstrate ability to grasp/ release a cup 2/3 trials with RUE.    Baseline 10/31/2020: Pt. continues to initiate grasp, and active release of objects, however is unable to grasp/release a cup. 08/08/20: Able to grasp, unable to release x1 trial    Time 12    Period Weeks    Status On-going    Target Date 12/05/20      OT LONG TERM GOAL #8   Title I with updated HEP.    Baseline 09/12/2020: Pt. conitinues to require assist for her HEP. Pt.  requires assist, and cuing for HEP. 08/08/20: reports completing with husband assist for technique    Time 12    Period Weeks    Status Partially Met    Target Date 12/05/20      OT LONG TERM GOAL  #9   TITLE Pt. will use her right hand to assist the left hand in searching for items in, and holding her purse.    Baseline Independent    Time 12    Period Weeks    Status Achieved      OT LONG TERM GOAL  #10   TITLE Pt. will use her right hand to assist the left to independently place eye glasses into a case.    Baseline Independent    Time 12    Period Weeks    Status Achieved                   Plan - 11/30/20 1958     Clinical Impression Statement Pt. Requires cues for movements at the shoulder with reaching tasks to decrease compensatory movement patterns and facilitate normal movement.  Pt continues to have tightness at the shoulder with flexion, ABD and ER. Difficulty with managing access to medication bottles especially those with child safety lids.  Cues for grasping patterns and utilizing available motion and strength to her advantage.  Continue  to encourage improving hand to mouth patterns for self feeding and grooming tasks.    OT Occupational Profile and History Detailed Assessment- Review of Records and additional review of physical, cognitive, psychosocial history related to current functional performance    Occupational performance deficits (Please refer to evaluation for details): ADL's;IADL's;Rest and Sleep;Work;Play;Leisure    Body Structure / Function / Physical Skills ADL;Balance;Coordination;Decreased knowledge of precautions;Decreased knowledge of use of DME;Dexterity;Edema;Mobility;Tone;Strength;IADL;Sensation;GMC;Gait;ROM;FMC;Flexibility;Pain;Vision;UE functional use;Endurance    Cognitive Skills Attention;Perception;Problem Solve;Safety Awareness;Thought    Rehab Potential Good    Clinical Decision Making Several treatment options, min-mod task modification  necessary    Comorbidities Affecting Occupational Performance: None    Modification or Assistance to Complete Evaluation  Min-Moderate modification of tasks or assist with assess necessary to complete eval    OT Frequency 3x / week    OT Duration 12 weeks    OT Treatment/Interventions Self-care/ADL training;Ultrasound;Energy conservation;Visual/perceptual remediation/compensation;Patient/family education;DME and/or AE instruction;Aquatic Therapy;Paraffin;Gait Training;Passive range of motion;Balance training;Fluidtherapy;Electrical Stimulation;Functional Mobility Training;Splinting;Moist Heat;Therapeutic exercise;Manual Therapy;Cognitive remediation/compensation;Manual lymph drainage;Neuromuscular education;Coping strategies training    Plan Pt is transferring her care to [facility] as it is closer to home. She remains very fearful of moving RUE and responds well to heat, and estim.    Consulted and Agree with Plan of Care Patient             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) 01/05/2020   History of adenomatous polyp of colon 02/07/2016   Anatomical narrow angle, bilateral 12/14/2014   Fissure in ano 11/11/2014   Hemorrhoids 11/11/2014   Constipation 11/19/2013   GERD (gastroesophageal reflux disease) 03/24/2013   Irritable bowel syndrome with diarrhea 03/24/2013   Herpes zoster 05/14/2005   Abnormal Pap smear of cervix 05/15/1983   History of cervical dysplasia 05/15/1983   Erika Stanton, OTR/L, CLT  Sigfredo Schreier 11/30/2020, 8:18 PM  [facility] [address] [place], [place], [address] Phone: [phone]   Fax:  [phone]  Name: ANALIS DISTLER MRN: [id number] Date of Birth: [birth year]

## 2020-12-01 ENCOUNTER — Ambulatory Visit: Payer: BC Managed Care – PPO | Admitting: Occupational Therapy

## 2020-12-01 ENCOUNTER — Other Ambulatory Visit: Payer: Self-pay

## 2020-12-01 ENCOUNTER — Encounter: Payer: Self-pay | Admitting: Occupational Therapy

## 2020-12-01 DIAGNOSIS — M6281 Muscle weakness (generalized): Secondary | ICD-10-CM

## 2020-12-01 DIAGNOSIS — R278 Other lack of coordination: Secondary | ICD-10-CM

## 2020-12-01 NOTE — Therapy (Signed)
[place] MAIN [facility] SERVICES [address] [place], [place], [address] Phone: [phone]   Fax:  [phone]  Occupational Therapy Treatment  Patient Details  Name: Erika Stanton MRN: [id number] Date of Birth: 19-Oct-1955 Referring Provider (OT): Dr. Naaman Plummer   Encounter Date: 12/01/2020   OT End of Session - 12/01/20 1432     Visit Number 65    Number of Visits 88    Date for OT Re-Evaluation 12/05/20    Authorization Type Progress report period starting 09/21/2020    OT Start Time 1300    OT Stop Time 1345    OT Time Calculation (min) 45 min    Activity Tolerance Patient tolerated treatment well    Behavior During Therapy [facility] for tasks assessed/performed             Past Medical History:  Diagnosis Date   Aneurysm ([place])    Melena     Past Surgical History:  Procedure Laterality Date   CHOLECYSTECTOMY     COLONOSCOPY  11/2017   at [facility]. no recurrent polyps.  suggest repeat surveillance study 11/2022.     COLONOSCOPY W/ POLYPECTOMY  06/2014   Dr Roxy Manns at [facility].  3 adenomatoous polyps, anal fissure.     ESOPHAGOGASTRODUODENOSCOPY (EGD) WITH PROPOFOL N/A 01/27/2020   Procedure: ESOPHAGOGASTRODUODENOSCOPY (EGD) WITH PROPOFOL;  Surgeon: Mauri Pole, MD;  Location: [facility];  Service: Endoscopy;  Laterality: N/A;   IR 3D INDEPENDENT WKST  01/06/2020   IR ANGIO INTRA EXTRACRAN SEL INTERNAL CAROTID BILAT MOD SED  01/06/2020   IR ANGIO VERTEBRAL SEL VERTEBRAL UNI L MOD SED  01/06/2020   IR ANGIOGRAM FOLLOW UP STUDY  01/06/2020   IR ANGIOGRAM FOLLOW UP STUDY  01/06/2020   IR ANGIOGRAM FOLLOW UP STUDY  01/06/2020   IR ANGIOGRAM FOLLOW UP STUDY  01/06/2020   IR ANGIOGRAM FOLLOW UP STUDY  01/06/2020   IR ANGIOGRAM FOLLOW UP STUDY  01/06/2020   IR ANGIOGRAM FOLLOW UP STUDY  01/06/2020   IR ANGIOGRAM FOLLOW UP STUDY  01/06/2020   IR ANGIOGRAM FOLLOW UP STUDY  01/06/2020   IR ANGIOGRAM FOLLOW UP STUDY  01/06/2020   IR NEURO EACH ADD'L  AFTER BASIC UNI RIGHT (MS)  01/06/2020   IR TRANSCATH/EMBOLIZ  01/06/2020   RADIOLOGY WITH ANESTHESIA N/A 01/06/2020   Procedure: IR WITH ANESTHESIA FOR ANEURYSM;  Surgeon: Consuella Lose, MD;  Location: [place];  Service: Radiology;  Laterality: N/A;   TUBAL LIGATION      There were no vitals filed for this visit.   Subjective Assessment - 12/01/20 1431     Subjective  Pt reports that she's hoping to go to the beach next week with a girlfriend for a few days.    Patient is accompanied by: Family member    Pertinent History ICH 01/06/20, Aneurysm. PMH: OA bilateral knees and hands    Repetition Decreases Symptoms    Currently in Pain? Yes    Pain Score 1     Pain Location Hand    Pain Orientation Right    Pain Descriptors / Indicators Aching    Pain Onset More than a month ago            OT TREATMENT     Therapeutic Exercise:   Pt. was able to tolerate AAROM/PROM for shoulder flexion, abduction, elbow flexion, elbow extension, AAROM supination PROM for right wrist, digit MP, PIP, and DIP flexion, and extension, AROM digit MP, PIP, and DIP  flexion, extension.Thumb radial, and palmar abduction, Thumb IP flexion, and extension in preparation for grasping objects. Pt. worked on [employer], and reciprocal motion using the UBE while seated for 5 min. with no resistance. Constant monitoring was provided.   Neuromuscular re-ed:   Pt. worked on [facility] skills formulating functional grasp patterns while grasping 1.5", 2", 3", and 4" pegs and reaching up to place them onto a pegboard positioned at a flat tabletop surface. Pt. continues to work on improving UE strength, and [facility] skills. Support was provided at her right elbow when reaching to the tabletop surface.   Pt. continues to present with 1/10 pain in her right hand/thumb.  Pt. continues to tolerate moist heat prior to, and in conjunction with ROM. Pt. tolerated ROM well today. Pt. continues to present with Improving active digit  grasp, and release with her right thumb, 2nd, and 3rd digits. Support was gradually tapered off the RUE when reaching to place the discs onto the tabletop surface. Pt. continues to work on improving RUE ROM, and functional reaching in preparation for engaging her UE during ADLs, and IADL tasks. Pt. continues to work on improving RUE ROM, and functional hand use during ADLs, and IADL tasks.                        OT Education - 12/01/20 1432     Education Details encouraged hand use, self feeding tasks, reaching to wash face    Person(s) Educated Patient    Methods Explanation;Demonstration;Tactile cues;Verbal cues    Comprehension Verbalized understanding;Returned demonstration;Verbal cues required              OT Short Term Goals - 10/31/20 1057       OT SHORT TERM GOAL #5   Title Pt will demonstrate improved LUE fine motor coordination for ADLs as evidenced by decreasing 9 hole peg test score to 25 secs or less.    Baseline 10/31/2020: Left FMC: 32 sec. Left FMC 41 sec visa the 9 hole peg test. 08/08/20: 32 seconds via 9 hole peg    Time 6    Period Weeks    Status On-going    Target Date 12/05/20               OT Long Term Goals - 10/31/20 1027       OT LONG TERM GOAL #1   Title Pt will perform all basic ADLs with supervision    Baseline 6/202/2022: Pt. is able to donn stretchy shirts, donn slide on shoes, donning pants. Pt. has difficylty with managing a bra,shirt buttons, and pant zipper/button.Pt. requires minA from her husband following multiple recent surgeries. Pt. requires minA from her husband. 08/08/20: Pt continues to require MIN A for grooming and dressing tasks.    Time 12    Period Weeks    Status On-going    Target Date 12/05/20      OT LONG TERM GOAL #2   Title Pt will demonstrate 60* RUE shoulder flexion in prep for functional reach with pain less than or equal to 3/10.    Baseline 10/31/20: Pt. continues to present with limited AROM  for isolated  right shoulder flexion with pain. 08/08/20: R AROM Shoulder flexion - 45* c 3/10 pain.    Time 12    Period Weeks    Status On-going    Target Date 12/05/20      OT LONG TERM GOAL #3   Title Pt will  use RUE as a stabilizer/ gross A at least 30 % of the time for ADLs/ IADLs.    Baseline Pt. is using her right hand as a gross stabilizer 15% of the time. 08/08/20: requires MAX cues    Time 12    Period Weeks    Status On-going    Target Date 12/05/20      OT LONG TERM GOAL #4   Title Pt will increase LUE grip strength to 25 lbs or greater for increased ease with ADLs.    Baseline 10/31/2020: Pt. continues to present with limited left grip. Pt. has improved with left grip strength. Pt.continues to work towards 25#. 08/08/20: L grip 19#    Time 12    Period Weeks    Status On-going    Target Date 12/05/20      OT LONG TERM GOAL #5   Title 10/31/2020: Pt. is now perfroming laundry, loading the dishwasher, assists some with cooking, light meal preparation, setting the table    Baseline Pt. assists with filling the dishwasher, and performing laundry. Pt. continues to try to help when she can, however her husband mostly performs these tasks. 08/08/20: Husband performs    Time 12    Period Weeks    Status On-going    Target Date 12/05/20      OT LONG TERM GOAL #6   Title Pt will demonstrate ability to grasp/ release a cup 2/3 trials with RUE.    Baseline 10/31/2020: Pt. continues to initiate grasp, and active release of objects, however is unable to grasp/release a cup. 08/08/20: Able to grasp, unable to release x1 trial    Time 12    Period Weeks    Status On-going    Target Date 12/05/20      OT LONG TERM GOAL #8   Title I with updated HEP.    Baseline 09/12/2020: Pt. conitinues to require assist for her HEP. Pt. requires assist, and cuing for HEP. 08/08/20: reports completing with husband assist for technique    Time 12    Period Weeks    Status Partially Met    Target Date  12/05/20      OT LONG TERM GOAL  #9   TITLE Pt. will use her right hand to assist the left hand in searching for items in, and holding her purse.    Baseline Independent    Time 12    Period Weeks    Status Achieved      OT LONG TERM GOAL  #10   TITLE Pt. will use her right hand to assist the left to independently place eye glasses into a case.    Baseline Independent    Time 12    Period Weeks    Status Achieved                   Plan - 12/01/20 1433     Clinical Impression Statement   Pt. continues to present with 1/10 pain in her right hand/thumb.  Pt. continues to tolerate moist heat prior to, and in conjunction with ROM. Pt. tolerated ROM well today. Pt. continues to present with Improving active digit grasp, and release with her right thumb, 2nd, and 3rd digits. Support was gradually tapered off the RUE when reaching to place the discs onto the tabletop surface. Pt. continues to work on improving RUE ROM, and functional reaching in preparation for engaging her UE during ADLs, and IADL tasks. Pt. continues to work on  improving RUE ROM, and functional hand use during ADLs, and IADL tasks.        OT Occupational Profile and History Detailed Assessment- Review of Records and additional review of physical, cognitive, psychosocial history related to current functional performance    Occupational performance deficits (Please refer to evaluation for details): ADL's;IADL's;Rest and Sleep;Work;Play;Leisure    Body Structure / Function / Physical Skills ADL;Balance;Coordination;Decreased knowledge of precautions;Decreased knowledge of use of DME;Dexterity;Edema;Mobility;Tone;Strength;IADL;Sensation;GMC;Gait;ROM;FMC;Flexibility;Pain;Vision;UE functional use;Endurance    Cognitive Skills Attention;Perception;Problem Solve;Safety Awareness;Thought    Rehab Potential Good    Clinical Decision Making Several treatment options, min-mod task modification necessary    Comorbidities  Affecting Occupational Performance: None    Modification or Assistance to Complete Evaluation  Min-Moderate modification of tasks or assist with assess necessary to complete eval    OT Frequency 3x / week    OT Duration 12 weeks    OT Treatment/Interventions Self-care/ADL training;Ultrasound;Energy conservation;Visual/perceptual remediation/compensation;Patient/family education;DME and/or AE instruction;Aquatic Therapy;Paraffin;Gait Training;Passive range of motion;Balance training;Fluidtherapy;Electrical Stimulation;Functional Mobility Training;Splinting;Moist Heat;Therapeutic exercise;Manual Therapy;Cognitive remediation/compensation;Manual lymph drainage;Neuromuscular education;Coping strategies training    Plan Pt is transferring her care to [facility] as it is closer to home. She remains very fearful of moving RUE and responds well to heat, and estim.    Consulted and Agree with Plan of Care Patient             Patient will benefit from skilled therapeutic intervention in order to improve the following deficits and impairments:   Body Structure / Function / Physical Skills: ADL, Balance, Coordination, Decreased knowledge of precautions, Decreased knowledge of use of DME, Dexterity, Edema, Mobility, Tone, Strength, IADL, Sensation, GMC, Gait, ROM, FMC, Flexibility, Pain, Vision, UE functional use, Endurance Cognitive Skills: Attention, Perception, Problem Solve, Safety Awareness, Thought     Visit Diagnosis: Muscle weakness (generalized)  Other lack of coordination    Problem List Patient Active Problem List   Diagnosis Date Noted   Dyspareunia in female 04/05/2020   History of abnormal cervical Pap smear 03/10/2020   GIB (gastrointestinal bleeding) 02/28/2020   Elevated BUN    Prediabetes    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   Cerebral vasospasm    Sinus tachycardia    Dysphagia, post-stroke    Thrombocytopenia ([facility])    Acute blood loss anemia    Brain aneurysm     ICH (intracerebral hemorrhage) ([place]) 01/05/2020   History of adenomatous polyp of colon 02/07/2016   Anatomical narrow angle, 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 [place], [place], [address] Phone: [phone]   Fax:  [phone]  Name: Erika Stanton MRN: [id number] Date of Birth: 27-Jun-1955

## 2020-12-05 ENCOUNTER — Ambulatory Visit: Payer: BC Managed Care – PPO | Admitting: Occupational Therapy

## 2020-12-06 ENCOUNTER — Ambulatory Visit: Payer: BC Managed Care – PPO | Admitting: Occupational Therapy

## 2020-12-08 ENCOUNTER — Ambulatory Visit: Payer: BC Managed Care – PPO | Admitting: Occupational Therapy

## 2020-12-12 ENCOUNTER — Ambulatory Visit: Payer: BC Managed Care – PPO | Admitting: Occupational Therapy

## 2020-12-13 ENCOUNTER — Encounter: Payer: Self-pay | Admitting: Occupational Therapy

## 2020-12-13 ENCOUNTER — Ambulatory Visit: Payer: BC Managed Care – PPO | Attending: Physical Medicine & Rehabilitation | Admitting: Occupational Therapy

## 2020-12-13 ENCOUNTER — Other Ambulatory Visit: Payer: Self-pay

## 2020-12-13 DIAGNOSIS — M25511 Pain in right shoulder: Secondary | ICD-10-CM | POA: Insufficient documentation

## 2020-12-13 DIAGNOSIS — M79641 Pain in right hand: Secondary | ICD-10-CM | POA: Insufficient documentation

## 2020-12-13 DIAGNOSIS — R278 Other lack of coordination: Secondary | ICD-10-CM | POA: Diagnosis present

## 2020-12-13 DIAGNOSIS — I69159 Hemiplegia and hemiparesis following nontraumatic intracerebral hemorrhage affecting unspecified side: Secondary | ICD-10-CM | POA: Diagnosis present

## 2020-12-13 DIAGNOSIS — M6281 Muscle weakness (generalized): Secondary | ICD-10-CM | POA: Insufficient documentation

## 2020-12-13 NOTE — Therapy (Addendum)
[place] MAIN [facility] SERVICES [address] [place], [place], [address] Phone: [phone]   Fax:  [phone]  Occupational Therapy Treatment/REcertification Progress Update Reporting period from 10/31/2020 to 12/13/2020  Patient Details  Name: Erika Stanton MRN: [id number] Date of Birth: May 27, 1955 Referring Provider (OT): Dr. Naaman Plummer   Encounter Date: 12/13/2020   OT End of Session - 12/14/20 0949     Visit Number 80    Number of Visits 104   Date for OT Re-Evaluation  03/09/2021   Authorization Type Progress report period starting 09/21/2020    OT Start Time 1303    OT Stop Time 1346    OT Time Calculation (min) 43 min    Activity Tolerance Patient tolerated treatment well    Behavior During Therapy [facility] for tasks assessed/performed             Past Medical History:  Diagnosis Date   Aneurysm ([place])    Melena     Past Surgical History:  Procedure Laterality Date   CHOLECYSTECTOMY     COLONOSCOPY  11/2017   at [facility]. no recurrent polyps.  suggest repeat surveillance study 11/2022.     COLONOSCOPY W/ POLYPECTOMY  06/2014   Dr Roxy Manns at [facility].  3 adenomatoous polyps, anal fissure.     ESOPHAGOGASTRODUODENOSCOPY (EGD) WITH PROPOFOL N/A 01/27/2020   Procedure: ESOPHAGOGASTRODUODENOSCOPY (EGD) WITH PROPOFOL;  Surgeon: Mauri Pole, MD;  Location: [facility];  Service: Endoscopy;  Laterality: N/A;   IR 3D INDEPENDENT WKST  01/06/2020   IR ANGIO INTRA EXTRACRAN SEL INTERNAL CAROTID BILAT MOD SED  01/06/2020   IR ANGIO VERTEBRAL SEL VERTEBRAL UNI L MOD SED  01/06/2020   IR ANGIOGRAM FOLLOW UP STUDY  01/06/2020   IR ANGIOGRAM FOLLOW UP STUDY  01/06/2020   IR ANGIOGRAM FOLLOW UP STUDY  01/06/2020   IR ANGIOGRAM FOLLOW UP STUDY  01/06/2020   IR ANGIOGRAM FOLLOW UP STUDY  01/06/2020   IR ANGIOGRAM FOLLOW UP STUDY  01/06/2020   IR ANGIOGRAM FOLLOW UP STUDY  01/06/2020   IR ANGIOGRAM FOLLOW UP STUDY  01/06/2020   IR ANGIOGRAM FOLLOW UP STUDY   01/06/2020   IR ANGIOGRAM FOLLOW UP STUDY  01/06/2020   IR NEURO EACH ADD'L AFTER BASIC UNI RIGHT (MS)  01/06/2020   IR TRANSCATH/EMBOLIZ  01/06/2020   RADIOLOGY WITH ANESTHESIA N/A 01/06/2020   Procedure: IR WITH ANESTHESIA FOR ANEURYSM;  Surgeon: Consuella Lose, MD;  Location: [facility];  Service: Radiology;  Laterality: N/A;   TUBAL LIGATION      There were no vitals filed for this visit.   Subjective Assessment - 12/14/20 0948     Subjective  Pt reports she had a great time with her friend going to the beach for a few days.  Her friend helped out with anything she needed during the trip.    Patient Stated Goals Pt would like to be as independent as possible.    Currently in Pain? Yes    Pain Score 1     Pain Location Arm    Pain Orientation Right    Pain Descriptors / Indicators Aching    Pain Type Chronic pain    Pain Onset More than a month ago    Pain Frequency Intermittent    Multiple Pain Sites No             Reassessment of skills this date, measurements taken for grip, pinch, ROM and strength see flowsheet for details.  Items which are still difficult for patient: assist with hanging items on clothes hanger Zippers especially with pants/jeans Grasp and release with right hand of items and using for self feeding, grooming tasks. Unable to get robe on after a shower and would like to be able to use terry cloth robe to dry off so that her husband doesn't have to help her.    Better: Left hand is improving with strength Speed is improving with completion of self care tasks  Moist heat to right shoulder and hand for 5 mins prior to ROM exercises.  PROM to RUE followed by [facility] for shoulder flexion to 90 degrees, ABD, elbow flexion/extension, forearm supination/pronation, wrist flexion/extension and digit flexion/extension.  Gross grasp and release patterns for 1 inch or greater in size objects.    Response to tx:   Pt demonstrates slightly decreased spasticity in  right hand especially with ulnar side of the hand.  She now has 1# of grip in the right, 2# pinch for lateral and 3 point but requires assistance to place dynamometer and pinch meter in her hand/digits.  Active shoulder flexion to 64 degrees this date with substitution of movements at the shoulder.  Pt continues to make progress however appears to be nearing a plateau, recommend decreasing frequency from 3 times a week to 2 times a week at this point.  Pt continues to benefit from skilled OT services, she is considering another round of Dysport (botox) injections but is not sure at this time.  Goals updated to reflect progress and some added based on pt feedback and self reported goals.       [facility] OT Assessment - 12/15/20 1011       ADL   Eating/Feeding --   left hand only   Grooming --   left hand use   Upper Body Bathing Minimal assistance    Lower Body Bathing Minimal assistance    Upper Body Dressing Increased time   pullover   Lower Body Dressing Modified independent   pants, underwear and slip on shoes, does not wear socks.   Medical laboratory scientific officer Modified independent    ADL comments Carries laundry in her arm against her body, able to put detergent in with her left hand, able to put clothes in dryer, assist with putting clothes on rack, unload lighter items from dishwasher, able to set the table,      IADL   Prior Level of Function Shopping independent    Shopping Needs to be accompanied on any shopping trip    Prior Level of Function Meal Prep independent    Meal Prep Able to complete simple cold meal and snack prep    Medication Management Takes responsibility if medication is prepared in advance in seperate dosage      Written Expression   Dominant Hand Right      Coordination   Right 9 Hole Peg Test 0   unable to complete   Left 9 Hole Peg Test 32      Hand Function   Right Hand Grip (lbs) 1    Right Hand Lateral Pinch 2 lbs    Right  Hand 3 Point Pinch 2 lbs    Left Hand Grip (lbs) 24    Left Hand Lateral Pinch 10 lbs    Left 3 point pinch 15 lbs  OT Education - 12/14/20 0949     Education Details goals, progress, POC, frequency    Person(s) Educated Patient    Methods Explanation;Demonstration;Tactile cues;Verbal cues    Comprehension Verbalized understanding;Returned demonstration;Verbal cues required              OT Short Term Goals - 10/31/20 1057       OT SHORT TERM GOAL #5   Title Pt will demonstrate improved LUE fine motor coordination for ADLs as evidenced by decreasing 9 hole peg test score to 25 secs or less.    Baseline 10/31/2020: Left FMC: 32 sec. Left FMC 41 sec visa the 9 hole peg test. 08/08/20: 32 seconds via 9 hole peg    Time 6    Period Weeks    Status On-going    Target Date 12/05/20               OT Long Term Goals - 12/14/20 1024       OT LONG TERM GOAL #1   Title Pt will perform all basic ADLs with supervision    Baseline 12/13/2020 unchanged 6/202/2022: Pt. is able to donn stretchy shirts, donn slide on shoes, donning pants. Pt. has difficylty with managing a bra,shirt buttons, and pant zipper/button.Pt. requires minA from her husband following multiple recent surgeries. Pt. requires minA from her husband. 08/08/20: Pt continues to require MIN A for grooming and dressing tasks.    Time 12    Period Weeks    Status On-going    Target Date 03/09/21      OT LONG TERM GOAL #2   Title Pt will demonstrate 60* RUE shoulder flexion in prep for functional reach with pain less than or equal to 3/10.    Baseline 12/13/20:  shoulder flexion right 64 degrees 10/31/20: Pt. continues to present with limited AROM for isolated  right shoulder flexion with pain. 08/08/20: R AROM Shoulder flexion - 45* c 3/10 pain.    Time 12    Period Weeks    Status On-going    Target Date 03/09/21      OT LONG TERM GOAL #3   Title Pt will use RUE as a  stabilizer/ gross A at least 30 % of the time for ADLs/ IADLs.    Baseline Pt. is using her right hand as a gross stabilizer 15% of the time. 08/08/20: requires MAX cues    Time 12    Period Weeks    Status Achieved      OT LONG TERM GOAL #4   Title Pt will increase LUE grip strength to 25 lbs or greater for increased ease with ADLs.    Baseline 12/13/20: 24# left, 1# right 10/31/2020: Pt. continues to present with limited left grip. Pt. has improved with left grip strength. Pt.continues to work towards 25#. 08/08/20: L grip 19#    Time 12    Period Weeks    Status On-going    Target Date 03/09/21      OT LONG TERM GOAL #5   Title 10/31/2020: Pt. is now perfroming laundry, loading the dishwasher, assists some with cooking, light meal preparation, setting the table    Baseline 12/13/2020:  continues to assist as needed but uses left hand .  10/13/20: Pt. assists with filling the dishwasher, and performing laundry. Pt. continues to try to help when she can, however her husband mostly performs these tasks. 08/08/20: Husband performs    Time 12    Period Weeks  Status On-going    Target Date 03/09/21      OT LONG TERM GOAL #6   Title Pt will demonstrate ability to grasp/ release a cup 2/3 trials with RUE.    Baseline 12/13/20:  Continues to work on consistency with grasp and release 10/31/2020: Pt. continues to initiate grasp, and active release of objects, however is unable to grasp/release a cup. 08/08/20: Able to grasp, unable to release x1 trial    Time 12    Period Weeks    Status On-going    Target Date 03/09/21      OT LONG TERM GOAL #7   Title Pt will demonstrate the ability to place clothing on hangers with use of bilateral UE and modified technique.    Baseline unable    Time 12    Period Weeks    Status New    Target Date 03/09/21      OT LONG TERM GOAL #8   Title I with updated HEP.    Baseline 12/13/2020:  Continual changes to HEP 09/12/2020: Pt. conitinues to require assist for her  HEP. Pt. requires assist, and cuing for HEP. 08/08/20: reports completing with husband assist for technique    Time 12    Period Weeks    Status Partially Met    Target Date 03/09/21      OT LONG TERM GOAL  #9   TITLE Pt will demonstrate the ability to don and doff robe after shower with modified independence    Baseline unable    Time 12    Period Weeks    Status New    Target Date 03/09/21      OT LONG TERM GOAL  #10   TITLE Pt will demonstrate the ability to perform zippers with modified independence and use of adaptive equipment as needed.    Baseline difficulty    Time 12    Period Weeks    Status New    Target Date 03/09/21                   Plan - 12/14/20 0950     Clinical Impression Statement Pt demonstrates slightly decreased spasticity in right hand especially with ulnar side of the hand.  She now has 1# of grip in the right, 2# pinch for lateral and 3 point but requires assistance to place dynamometer and pinch meter in her hand/digits.  Active shoulder flexion to 84 degrees this date with substitution of movements at the shoulder.  Pt continues to make progress however appears to be nearing a plateau, recommend decreasing frequency from 3 times a week to 2 times a week at this point.  Pt continues to benefit from skilled OT services, she is considering another round of Dysport (botox) injections but is not sure at this time.  Goals updated to reflect progress and some added based on pt feedback and self reported goals.    OT Occupational Profile and History Detailed Assessment- Review of Records and additional review of physical, cognitive, psychosocial history related to current functional performance    Occupational performance deficits (Please refer to evaluation for details): ADL's;IADL's;Rest and Sleep;Work;Play;Leisure    Body Structure / Function / Physical Skills ADL;Balance;Coordination;Decreased knowledge of precautions;Decreased knowledge of use of  DME;Dexterity;Edema;Mobility;Tone;Strength;IADL;Sensation;GMC;Gait;ROM;FMC;Flexibility;Pain;Vision;UE functional use;Endurance    Cognitive Skills Attention;Perception;Problem Solve;Safety Awareness;Thought    Rehab Potential Good    Clinical Decision Making Several treatment options, min-mod task modification necessary    Comorbidities Affecting Occupational Performance: None  Modification or Assistance to Complete Evaluation  Min-Moderate modification of tasks or assist with assess necessary to complete eval    OT Frequency 2x / week    OT Duration 12 weeks    OT Treatment/Interventions Self-care/ADL training;Ultrasound;Energy conservation;Visual/perceptual remediation/compensation;Patient/family education;DME and/or AE instruction;Aquatic Therapy;Paraffin;Gait Training;Passive range of motion;Balance training;Fluidtherapy;Electrical Stimulation;Functional Mobility Training;Splinting;Moist Heat;Therapeutic exercise;Manual Therapy;Cognitive remediation/compensation;Manual lymph drainage;Neuromuscular education;Coping strategies training    Plan Pt is transferring her care to [facility] as it is closer to home. She remains very fearful of moving RUE and responds well to heat, and estim.    Consulted and Agree with Plan of Care Patient             Patient will benefit from skilled therapeutic intervention in order to improve the following deficits and impairments:   Body Structure / Function / Physical Skills: ADL, Balance, Coordination, Decreased knowledge of precautions, Decreased knowledge of use of DME, Dexterity, Edema, Mobility, Tone, Strength, IADL, Sensation, GMC, Gait, ROM, FMC, Flexibility, Pain, Vision, UE functional use, Endurance Cognitive Skills: Attention, Perception, Problem Solve, Safety Awareness, Thought     Visit Diagnosis: Muscle weakness (generalized) - Plan: Ot plan of care cert/re-cert  Other lack of coordination - Plan: Ot plan of care cert/re-cert  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, unspecified laterality ([place]) - Plan: Ot plan of care cert/re-cert  Acute pain of right shoulder - Plan: Ot plan of care cert/re-cert  Pain in right hand - Plan: Ot plan of care cert/re-cert    Problem List Patient Active Problem List   Diagnosis Date Noted   Dyspareunia in female 04/05/2020   History of abnormal cervical Pap smear 03/10/2020   GIB (gastrointestinal bleeding) 02/28/2020   Elevated BUN    Prediabetes    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   Cerebral vasospasm    Sinus tachycardia    Dysphagia, post-stroke    Thrombocytopenia (HCC)    Acute blood loss anemia    Brain aneurysm    ICH (intracerebral hemorrhage) ([place]) 01/05/2020   History of adenomatous polyp of colon 02/07/2016   Anatomical narrow angle, bilateral 12/14/2014   Fissure in ano 11/11/2014   Hemorrhoids 11/11/2014   Constipation 11/19/2013   GERD (gastroesophageal reflux disease) 03/24/2013   Irritable bowel syndrome with diarrhea 03/24/2013   Herpes zoster 05/14/2005   Abnormal Pap smear of cervix 05/15/1983   History of cervical dysplasia 05/15/1983   Erika Stanton, OTR/L, CLT  Stavros Cail 12/15/2020, 10:35 AM  [place], [place], [address] Phone: [phone]   Fax:  [phone]  Name: BLAKELEE ALLINGTON MRN: [id number] Date of Birth: 1955/06/04

## 2020-12-15 ENCOUNTER — Ambulatory Visit: Payer: BC Managed Care – PPO | Admitting: Occupational Therapy

## 2020-12-15 ENCOUNTER — Other Ambulatory Visit: Payer: Self-pay

## 2020-12-15 DIAGNOSIS — M25511 Pain in right shoulder: Secondary | ICD-10-CM

## 2020-12-15 DIAGNOSIS — M6281 Muscle weakness (generalized): Secondary | ICD-10-CM

## 2020-12-15 DIAGNOSIS — M79641 Pain in right hand: Secondary | ICD-10-CM

## 2020-12-15 DIAGNOSIS — R278 Other lack of coordination: Secondary | ICD-10-CM

## 2020-12-15 DIAGNOSIS — I69159 Hemiplegia and hemiparesis following nontraumatic intracerebral hemorrhage affecting unspecified side: Secondary | ICD-10-CM

## 2020-12-18 NOTE — Therapy (Signed)
[place] MAIN [facility] SERVICES [address] [place], [place], [address] Phone: [phone]   Fax:  [phone]  Occupational Therapy Treatment  Patient Details  Name: Erika Stanton MRN: [id number] Date of Birth: 1955-12-31 Referring Provider (OT): Dr. Naaman Plummer   Encounter Date: 12/15/2020   OT End of Session - 12/18/20 1848     Visit Number 81    Number of Visits 104    Date for OT Re-Evaluation 03/09/21    Authorization Type Progress report period starting 09/21/2020    OT Start Time 1303    OT Stop Time 1346    OT Time Calculation (min) 43 min    Activity Tolerance Patient tolerated treatment well    Behavior During Therapy [facility] for tasks assessed/performed             Past Medical History:  Diagnosis Date   Aneurysm ([place])    Melena     Past Surgical History:  Procedure Laterality Date   CHOLECYSTECTOMY     COLONOSCOPY  11/2017   at [facility]. no recurrent polyps.  suggest repeat surveillance study 11/2022.     COLONOSCOPY W/ POLYPECTOMY  06/2014   Dr Roxy Manns at [facility].  3 adenomatoous polyps, anal fissure.     ESOPHAGOGASTRODUODENOSCOPY (EGD) WITH PROPOFOL N/A 01/27/2020   Procedure: ESOPHAGOGASTRODUODENOSCOPY (EGD) WITH PROPOFOL;  Surgeon: Mauri Pole, MD;  Location: [facility];  Service: Endoscopy;  Laterality: N/A;   IR 3D INDEPENDENT WKST  01/06/2020   IR ANGIO INTRA EXTRACRAN SEL INTERNAL CAROTID BILAT MOD SED  01/06/2020   IR ANGIO VERTEBRAL SEL VERTEBRAL UNI L MOD SED  01/06/2020   IR ANGIOGRAM FOLLOW UP STUDY  01/06/2020   IR ANGIOGRAM FOLLOW UP STUDY  01/06/2020   IR ANGIOGRAM FOLLOW UP STUDY  01/06/2020   IR ANGIOGRAM FOLLOW UP STUDY  01/06/2020   IR ANGIOGRAM FOLLOW UP STUDY  01/06/2020   IR ANGIOGRAM FOLLOW UP STUDY  01/06/2020   IR ANGIOGRAM FOLLOW UP STUDY  01/06/2020   IR ANGIOGRAM FOLLOW UP STUDY  01/06/2020   IR ANGIOGRAM FOLLOW UP STUDY  01/06/2020   IR ANGIOGRAM FOLLOW UP STUDY  01/06/2020   IR NEURO EACH ADD'L  AFTER BASIC UNI RIGHT (MS)  01/06/2020   IR TRANSCATH/EMBOLIZ  01/06/2020   RADIOLOGY WITH ANESTHESIA N/A 01/06/2020   Procedure: IR WITH ANESTHESIA FOR ANEURYSM;  Surgeon: Consuella Lose, MD;  Location: [place];  Service: Radiology;  Laterality: N/A;   TUBAL LIGATION      There were no vitals filed for this visit.   Subjective Assessment - 12/18/20 1846     Subjective  Pt reports she is hoping to go back to the beach before she has her next surgery in September to further address her aneurysm.    Pertinent History ICH 01/06/20, Aneurysm. PMH: OA bilateral knees and hands    Patient Stated Goals Pt would like to be as independent as possible.    Currently in Pain? Yes    Pain Score 1     Pain Location Arm    Pain Orientation Right    Pain Descriptors / Indicators Aching    Pain Type Chronic pain    Pain Onset More than a month ago    Pain Frequency Intermittent            Moist heat to right shoulder and hand  to decrease pain and increase motion for 5 mins prior to RIM and therapeutic exercises.  Therex: Pt  seen for PROM to RUE, shoulder flexion to 90 degrees, (increased pain greater than 90) ABD to 85, ER, elbow flexion/extension, supination/pronation, wrist flexion/extension, digit flexion.    RUE AAROM: Shoulder flexion/abduction,ER, elbow flexion/extension, supination/pronation, wrist ext/flex, digit adduction/abduction and tapping.  Neuro: Pt seen for manipulation of small ball pegs to place into grid with use of right hand, difficulty with manipulation for turning items to correct position to be able to place into grid. If ball pegs are placed into grid, pt is able to remove items with right hand with cues for shoulder movement patterns. Pt still tends to hike right shoulder and substitute movements when attempting to use for active reach.   Response to tx:  Pt asking if she should receive another round of Dysport(like Botox). Pt did have some positive effects from last  injection and she will need to personally decide if she would like to do another round and suggested she speak with her physician about it more. Pt has continued to make progress with right hand, still has more spasticity in the ring and small finger of the hand than other digits. She has been working on ROM of shoulder in the clinic and at home but still demonstrates decreased strength and normal movement patterns, tends to hike shoulder when reaching and requires cues to decrease these movement patterns.  Continue towards goals to maximize safety and independence in necessary daily tasks.                        OT Education - 12/18/20 1847     Education Details ROM, reaching, lateral pinch, gross grasp and release of water bottle    Person(s) Educated Patient    Methods Explanation;Demonstration;Tactile cues;Verbal cues    Comprehension Verbalized understanding;Returned demonstration;Verbal cues required              OT Short Term Goals - 10/31/20 1057       OT SHORT TERM GOAL #5   Title Pt will demonstrate improved LUE fine motor coordination for ADLs as evidenced by decreasing 9 hole peg test score to 25 secs or less.    Baseline 10/31/2020: Left FMC: 32 sec. Left FMC 41 sec visa the 9 hole peg test. 08/08/20: 32 seconds via 9 hole peg    Time 6    Period Weeks    Status On-going    Target Date 12/05/20               OT Long Term Goals - 12/14/20 1024       OT LONG TERM GOAL #1   Title Pt will perform all basic ADLs with supervision    Baseline 12/13/2020 unchanged 6/202/2022: Pt. is able to donn stretchy shirts, donn slide on shoes, donning pants. Pt. has difficylty with managing a bra,shirt buttons, and pant zipper/button.Pt. requires minA from her husband following multiple recent surgeries. Pt. requires minA from her husband. 08/08/20: Pt continues to require MIN A for grooming and dressing tasks.    Time 12    Period Weeks    Status On-going    Target  Date 03/09/21      OT LONG TERM GOAL #2   Title Pt will demonstrate 60* RUE shoulder flexion in prep for functional reach with pain less than or equal to 3/10.    Baseline 12/13/20:  shoulder flexion right 64 degrees 10/31/20: Pt. continues to present with limited AROM for isolated  right shoulder flexion with pain. 08/08/20: R  AROM Shoulder flexion - 45* c 3/10 pain.    Time 12    Period Weeks    Status On-going    Target Date 03/09/21      OT LONG TERM GOAL #3   Title Pt will use RUE as a stabilizer/ gross A at least 30 % of the time for ADLs/ IADLs.    Baseline Pt. is using her right hand as a gross stabilizer 15% of the time. 08/08/20: requires MAX cues    Time 12    Period Weeks    Status Achieved      OT LONG TERM GOAL #4   Title Pt will increase LUE grip strength to 25 lbs or greater for increased ease with ADLs.    Baseline 12/13/20: 24# left, 1# right 10/31/2020: Pt. continues to present with limited left grip. Pt. has improved with left grip strength. Pt.continues to work towards 25#. 08/08/20: L grip 19#    Time 12    Period Weeks    Status On-going    Target Date 03/09/21      OT LONG TERM GOAL #5   Title 10/31/2020: Pt. is now perfroming laundry, loading the dishwasher, assists some with cooking, light meal preparation, setting the table    Baseline 12/13/2020:  continues to assist as needed but uses left hand .  10/13/20: Pt. assists with filling the dishwasher, and performing laundry. Pt. continues to try to help when she can, however her husband mostly performs these tasks. 08/08/20: Husband performs    Time 12    Period Weeks    Status On-going    Target Date 03/09/21      OT LONG TERM GOAL #6   Title Pt will demonstrate ability to grasp/ release a cup 2/3 trials with RUE.    Baseline 12/13/20:  Continues to work on consistency with grasp and release 10/31/2020: Pt. continues to initiate grasp, and active release of objects, however is unable to grasp/release a cup. 08/08/20: Able to  grasp, unable to release x1 trial    Time 12    Period Weeks    Status On-going    Target Date 03/09/21      OT LONG TERM GOAL #7   Title Pt will demonstrate the ability to place clothing on hangers with use of bilateral UE and modified technique.    Baseline unable    Time 12    Period Weeks    Status New    Target Date 03/09/21      OT LONG TERM GOAL #8   Title I with updated HEP.    Baseline 12/13/2020:  Continual changes to HEP 09/12/2020: Pt. conitinues to require assist for her HEP. Pt. requires assist, and cuing for HEP. 08/08/20: reports completing with husband assist for technique    Time 12    Period Weeks    Status Partially Met    Target Date 03/09/21      OT LONG TERM GOAL  #9   TITLE Pt will demonstrate the ability to don and doff robe after shower with modified independence    Baseline unable    Time 12    Period Weeks    Status New    Target Date 03/09/21      OT LONG TERM GOAL  #10   TITLE Pt will demonstrate the ability to perform zippers with modified independence and use of adaptive equipment as needed.    Baseline difficulty    Time 12  Period Weeks    Status New    Target Date 03/09/21                   Plan - 12/18/20 1854     Clinical Impression Statement Pt asking if she should receive another round of Dysport(like Botox). Pt did have some positive effects from last injection and she will need to personally decide if she would like to do another round and suggested she speak with her physician about it more. Pt has continued to make progress with right hand, still has more spasticity in the ring and small finger of the hand than other digits. She has been working on ROM of shoulder in the clinic and at home but still demonstrates decreased strength and normal movement patterns, tends to hike shoulder when reaching and requires cues to decrease these movement patterns.  Continue towards goals to maximize safety and independence in necessary  daily tasks.    OT Occupational Profile and History Detailed Assessment- Review of Records and additional review of physical, cognitive, psychosocial history related to current functional performance    Occupational performance deficits (Please refer to evaluation for details): ADL's;IADL's;Rest and Sleep;Work;Play;Leisure    Body Structure / Function / Physical Skills ADL;Balance;Coordination;Decreased knowledge of precautions;Decreased knowledge of use of DME;Dexterity;Edema;Mobility;Tone;Strength;IADL;Sensation;GMC;Gait;ROM;FMC;Flexibility;Pain;Vision;UE functional use;Endurance    Cognitive Skills Attention;Perception;Problem Solve;Safety Awareness;Thought    Rehab Potential Good    Clinical Decision Making Several treatment options, min-mod task modification necessary    Comorbidities Affecting Occupational Performance: None    Modification or Assistance to Complete Evaluation  Min-Moderate modification of tasks or assist with assess necessary to complete eval    OT Frequency 2x / week    OT Duration 12 weeks    OT Treatment/Interventions Self-care/ADL training;Ultrasound;Energy conservation;Visual/perceptual remediation/compensation;Patient/family education;DME and/or AE instruction;Aquatic Therapy;Paraffin;Gait Training;Passive range of motion;Balance training;Fluidtherapy;Electrical Stimulation;Functional Mobility Training;Splinting;Moist Heat;Therapeutic exercise;Manual Therapy;Cognitive remediation/compensation;Manual lymph drainage;Neuromuscular education;Coping strategies training    Plan Pt is transferring her care to [facility] OP as it is closer to home. She remains very fearful of moving RUE and responds well to heat, and estim.    Consulted and Agree with Plan of Care Patient             Patient will benefit from skilled therapeutic intervention in order to improve the following deficits and impairments:   Body Structure / Function / Physical Skills: ADL, Balance, Coordination,  Decreased knowledge of precautions, Decreased knowledge of use of DME, Dexterity, Edema, Mobility, Tone, Strength, IADL, Sensation, GMC, Gait, ROM, FMC, Flexibility, Pain, Vision, UE functional use, Endurance Cognitive Skills: Attention, Perception, Problem Solve, Safety Awareness, Thought     Visit Diagnosis: Muscle weakness (generalized)  Other lack of coordination  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, unspecified laterality (HCC)  Acute pain of right shoulder  Pain in right hand    Problem List Patient Active Problem List   Diagnosis Date Noted   Dyspareunia in female 04/05/2020   History of abnormal cervical Pap smear 03/10/2020   GIB (gastrointestinal bleeding) 02/28/2020   Elevated BUN    Prediabetes    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[employer]) 01/20/2020   Cerebral vasospasm    Sinus tachycardia    Dysphagia, post-stroke    Thrombocytopenia (HCC)    Acute blood loss anemia    Brain aneurysm    ICH (intracerebral hemorrhage) ([place]) 01/05/2020   History of adenomatous polyp of colon 02/07/2016   Anatomical narrow angle, bilateral 12/14/2014   Fissure in ano 11/11/2014  Hemorrhoids 11/11/2014   Constipation 11/19/2013   GERD (gastroesophageal reflux disease) 03/24/2013   Irritable bowel syndrome with diarrhea 03/24/2013   Herpes zoster 05/14/2005   Abnormal Pap smear of cervix 05/15/1983   History of cervical dysplasia 05/15/1983   Tayte Mcwherter T Efren Kross, OTR/L, CLT  Lee-Anne Flicker 12/18/2020, 6:58 PM  [place] MAIN [facility] SERVICES [address] [place], [place], [address] Phone: [phone]   Fax:  [phone]  Name: SWAYZIE CHOATE MRN: [id number] Date of Birth: 1956-01-23

## 2020-12-19 ENCOUNTER — Ambulatory Visit: Payer: BC Managed Care – PPO | Admitting: Occupational Therapy

## 2020-12-20 ENCOUNTER — Ambulatory Visit: Payer: BC Managed Care – PPO

## 2020-12-20 ENCOUNTER — Other Ambulatory Visit: Payer: Self-pay

## 2020-12-20 DIAGNOSIS — M6281 Muscle weakness (generalized): Secondary | ICD-10-CM

## 2020-12-20 DIAGNOSIS — I69159 Hemiplegia and hemiparesis following nontraumatic intracerebral hemorrhage affecting unspecified side: Secondary | ICD-10-CM

## 2020-12-20 DIAGNOSIS — R278 Other lack of coordination: Secondary | ICD-10-CM

## 2020-12-20 NOTE — Therapy (Signed)
[place] MAIN [facility] SERVICES [address] [place], [place], [address] Phone: [phone]   Fax:  [phone]  Occupational Therapy Treatment  Patient Details  Name: Erika Stanton MRN: [id number] Date of Birth: 1955-08-03 Referring Provider (OT): Dr. Naaman Plummer   Encounter Date: 12/20/2020   OT End of Session - 12/20/20 1317     Visit Number [id number]    Number of Visits 104    Date for OT Re-Evaluation 03/09/21    Authorization Type Progress report period starting 09/21/2020    Authorization Time [place]    OT Start Time 1115    OT Stop Time 1157    OT Time Calculation (min) 42 min    Activity Tolerance Patient tolerated treatment well    Behavior During Therapy [facility] for tasks assessed/performed             Past Medical History:  Diagnosis Date   Aneurysm ([place])    Melena     Past Surgical History:  Procedure Laterality Date   CHOLECYSTECTOMY     COLONOSCOPY  11/2017   at [facility]. no recurrent polyps.  suggest repeat surveillance study 11/2022.     COLONOSCOPY W/ POLYPECTOMY  06/2014   Dr Roxy Manns at [facility].  3 adenomatoous polyps, anal fissure.     ESOPHAGOGASTRODUODENOSCOPY (EGD) WITH PROPOFOL N/A 01/27/2020   Procedure: ESOPHAGOGASTRODUODENOSCOPY (EGD) WITH PROPOFOL;  Surgeon: Mauri Pole, MD;  Location: [facility];  Service: Endoscopy;  Laterality: N/A;   IR 3D INDEPENDENT WKST  01/06/2020   IR ANGIO INTRA EXTRACRAN SEL INTERNAL CAROTID BILAT MOD SED  01/06/2020   IR ANGIO VERTEBRAL SEL VERTEBRAL UNI L MOD SED  01/06/2020   IR ANGIOGRAM FOLLOW UP STUDY  01/06/2020   IR ANGIOGRAM FOLLOW UP STUDY  01/06/2020   IR ANGIOGRAM FOLLOW UP STUDY  01/06/2020   IR ANGIOGRAM FOLLOW UP STUDY  01/06/2020   IR ANGIOGRAM FOLLOW UP STUDY  01/06/2020   IR ANGIOGRAM FOLLOW UP STUDY  01/06/2020   IR ANGIOGRAM FOLLOW UP STUDY  01/06/2020   IR ANGIOGRAM FOLLOW UP STUDY  01/06/2020   IR ANGIOGRAM FOLLOW UP STUDY  01/06/2020   IR ANGIOGRAM FOLLOW UP  STUDY  01/06/2020   IR NEURO EACH ADD'L AFTER BASIC UNI RIGHT (MS)  01/06/2020   IR TRANSCATH/EMBOLIZ  01/06/2020   RADIOLOGY WITH ANESTHESIA N/A 01/06/2020   Procedure: IR WITH ANESTHESIA FOR ANEURYSM;  Surgeon: Consuella Lose, MD;  Location: [facility];  Service: Radiology;  Laterality: N/A;   TUBAL LIGATION      There were no vitals filed for this visit.   Subjective Assessment - 12/20/20 1315     Subjective  "I'm scheduled for another Botox shot at the end of the month."    Pertinent History ICH 01/06/20, Aneurysm. PMH: OA bilateral knees and hands    Repetition Decreases Symptoms    Patient Stated Goals Pt would like to be as independent as possible.    Currently in Pain? Yes    Pain Score 2     Pain Location Arm    Pain Orientation Right    Pain Descriptors / Indicators Aching    Pain Type Chronic pain    Pain Radiating Towards shoulder, hand    Pain Onset More than a month ago    Pain Frequency Intermittent    Aggravating Factors  activity    Pain Relieving Factors rest/heat    Multiple Pain Sites No    Pain Onset More  than a month ago            Occupational Therapy Treatment: Manual Therapy: Heat pack applied to R shoulder and distal UE with simultaneous passive stretching for R shoulder flex/abd/ER, R hand wrist and digit extension, and digit abduction with good tolerance, working to reduce pain and stiffness throughout RUE to maximize functional use of arm.  Neuro re-ed: Placed spherical pegs on pegboard on an incline; Pt made multiple attempts to place pegs in pegboard using R hand but not quite able and dropped pegs.  OT placed pegs in pegboard for pt to remove and place back into container.  Varied positioning of pegboard and container to facilitate multiple plane reaching.  Tactile cues to R shoulder to minimize shoulder hike throughout reaching activities.  Frequent rest breaks needed.    Response to Treatment: After education today, pt seems agreeable and willing  to try another Botox shot at the end of the month, which is already scheduled, though pt hadn't been sure if she would go.  Pt reports good carryover with passive stretching at home, by spouse and herself on days she does not come to therapy.  Pt will benefit from continued skilled OT for maximizing strength and coordination throughout RUE in order to maximize functional use of RUE with ADLs.      OT Education - 12/20/20 1317     Education Details Benefits of Botox for RUE (pt reported she was considering not going to her appointment)    Person(s) Educated Patient    Methods Explanation;Verbal cues    Comprehension Verbalized understanding              OT Short Term Goals - 10/31/20 1057       OT SHORT TERM GOAL #5   Title Pt will demonstrate improved LUE fine motor coordination for ADLs as evidenced by decreasing 9 hole peg test score to 25 secs or less.    Baseline 10/31/2020: Left FMC: 32 sec. Left FMC 41 sec visa the 9 hole peg test. 08/08/20: 32 seconds via 9 hole peg    Time 6    Period Weeks    Status On-going    Target Date 12/05/20               OT Long Term Goals - 12/14/20 1024       OT LONG TERM GOAL #1   Title Pt will perform all basic ADLs with supervision    Baseline 12/13/2020 unchanged 6/202/2022: Pt. is able to donn stretchy shirts, donn slide on shoes, donning pants. Pt. has difficylty with managing a bra,shirt buttons, and pant zipper/button.Pt. requires minA from her husband following multiple recent surgeries. Pt. requires minA from her husband. 08/08/20: Pt continues to require MIN A for grooming and dressing tasks.    Time 12    Period Weeks    Status On-going    Target Date 03/09/21      OT LONG TERM GOAL #2   Title Pt will demonstrate 60* RUE shoulder flexion in prep for functional reach with pain less than or equal to 3/10.    Baseline 12/13/20:  shoulder flexion right 64 degrees 10/31/20: Pt. continues to present with limited AROM for isolated   right shoulder flexion with pain. 08/08/20: R AROM Shoulder flexion - 45* c 3/10 pain.    Time 12    Period Weeks    Status On-going    Target Date 03/09/21      OT LONG TERM  GOAL #3   Title Pt will use RUE as a stabilizer/ gross A at least 30 % of the time for ADLs/ IADLs.    Baseline Pt. is using her right hand as a gross stabilizer 15% of the time. 08/08/20: requires MAX cues    Time 12    Period Weeks    Status Achieved      OT LONG TERM GOAL #4   Title Pt will increase LUE grip strength to 25 lbs or greater for increased ease with ADLs.    Baseline 12/13/20: 24# left, 1# right 10/31/2020: Pt. continues to present with limited left grip. Pt. has improved with left grip strength. Pt.continues to work towards 25#. 08/08/20: L grip 19#    Time 12    Period Weeks    Status On-going    Target Date 03/09/21      OT LONG TERM GOAL #5   Title 10/31/2020: Pt. is now perfroming laundry, loading the dishwasher, assists some with cooking, light meal preparation, setting the table    Baseline 12/13/2020:  continues to assist as needed but uses left hand .  10/13/20: Pt. assists with filling the dishwasher, and performing laundry. Pt. continues to try to help when she can, however her husband mostly performs these tasks. 08/08/20: Husband performs    Time 12    Period Weeks    Status On-going    Target Date 03/09/21      OT LONG TERM GOAL #6   Title Pt will demonstrate ability to grasp/ release a cup 2/3 trials with RUE.    Baseline 12/13/20:  Continues to work on consistency with grasp and release 10/31/2020: Pt. continues to initiate grasp, and active release of objects, however is unable to grasp/release a cup. 08/08/20: Able to grasp, unable to release x1 trial    Time 12    Period Weeks    Status On-going    Target Date 03/09/21      OT LONG TERM GOAL #7   Title Pt will demonstrate the ability to place clothing on hangers with use of bilateral UE and modified technique.    Baseline unable    Time  12    Period Weeks    Status New    Target Date 03/09/21      OT LONG TERM GOAL #8   Title I with updated HEP.    Baseline 12/13/2020:  Continual changes to HEP 09/12/2020: Pt. conitinues to require assist for her HEP. Pt. requires assist, and cuing for HEP. 08/08/20: reports completing with husband assist for technique    Time 12    Period Weeks    Status Partially Met    Target Date 03/09/21      OT LONG TERM GOAL  #9   TITLE Pt will demonstrate the ability to don and doff robe after shower with modified independence    Baseline unable    Time 12    Period Weeks    Status New    Target Date 03/09/21      OT LONG TERM GOAL  #10   TITLE Pt will demonstrate the ability to perform zippers with modified independence and use of adaptive equipment as needed.    Baseline difficulty    Time 12    Period Weeks    Status New    Target Date 03/09/21                   Plan - 12/20/20 1328  Clinical Impression Statement After education today, pt seems agreeable and willing to try another Botox shot at the end of the month, which is already scheduled, though pt hadn't been sure if she would go.  Pt reports good carryover with passive stretching at home, by spouse and herself on days she does not come to therapy.  Pt will benefit from continued skilled OT for maximizing strength and coordination throughout RUE in order to maximize functional use of RUE with ADLs.    OT Occupational Profile and History Detailed Assessment- Review of Records and additional review of physical, cognitive, psychosocial history related to current functional performance    Occupational performance deficits (Please refer to evaluation for details): ADL's;IADL's;Rest and Sleep;Work;Play;Leisure    Body Structure / Function / Physical Skills ADL;Balance;Coordination;Decreased knowledge of precautions;Decreased knowledge of use of  DME;Dexterity;Edema;Mobility;Tone;Strength;IADL;Sensation;GMC;Gait;ROM;FMC;Flexibility;Pain;Vision;UE functional use;Endurance    Cognitive Skills Attention;Perception;Problem Solve;Safety Awareness;Thought    Rehab Potential Good    Clinical Decision Making Several treatment options, min-mod task modification necessary    Comorbidities Affecting Occupational Performance: None    Modification or Assistance to Complete Evaluation  Min-Moderate modification of tasks or assist with assess necessary to complete eval    OT Frequency 2x / week    OT Duration 12 weeks    OT Treatment/Interventions Self-care/ADL training;Ultrasound;Energy conservation;Visual/perceptual remediation/compensation;Patient/family education;DME and/or AE instruction;Aquatic Therapy;Paraffin;Gait Training;Passive range of motion;Balance training;Fluidtherapy;Electrical Stimulation;Functional Mobility Training;Splinting;Moist Heat;Therapeutic exercise;Manual Therapy;Cognitive remediation/compensation;Manual lymph drainage;Neuromuscular education;Coping strategies training    Plan Pt is transferring her care to [facility] OP as it is closer to home. She remains very fearful of moving RUE and responds well to heat, and estim.    Consulted and Agree with Plan of Care Patient             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, unspecified  laterality (HCC)    Problem List Patient Active Problem List   Diagnosis Date Noted   Dyspareunia in female 04/05/2020   History of abnormal cervical Pap smear 03/10/2020   GIB (gastrointestinal bleeding) 02/28/2020   Elevated BUN    Prediabetes    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   Cerebral vasospasm    Sinus tachycardia    Dysphagia, post-stroke    Thrombocytopenia (HCC)    Acute blood loss anemia    Brain aneurysm    ICH (intracerebral hemorrhage) ([place]) 01/05/2020   History of adenomatous polyp of colon 02/07/2016   Anatomical narrow angle, 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 [place], [place], [address] Phone: [phone]   Fax:  [phone]  Name: Erika Stanton MRN: [id number] Date of Birth: May 26, 1955

## 2020-12-21 ENCOUNTER — Other Ambulatory Visit: Payer: Self-pay | Admitting: Physical Medicine and Rehabilitation

## 2020-12-22 ENCOUNTER — Ambulatory Visit: Payer: BC Managed Care – PPO | Admitting: Occupational Therapy

## 2020-12-22 ENCOUNTER — Other Ambulatory Visit: Payer: Self-pay

## 2020-12-22 DIAGNOSIS — M79641 Pain in right hand: Secondary | ICD-10-CM

## 2020-12-22 DIAGNOSIS — I69159 Hemiplegia and hemiparesis following nontraumatic intracerebral hemorrhage affecting unspecified side: Secondary | ICD-10-CM

## 2020-12-22 DIAGNOSIS — M25511 Pain in right shoulder: Secondary | ICD-10-CM

## 2020-12-22 DIAGNOSIS — M6281 Muscle weakness (generalized): Secondary | ICD-10-CM

## 2020-12-22 DIAGNOSIS — R278 Other lack of coordination: Secondary | ICD-10-CM

## 2020-12-22 NOTE — Therapy (Signed)
[place] MAIN [facility] SERVICES [address] [place], [place], [address] Phone: [phone]   Fax:  [phone]  Occupational Therapy Treatment  Patient Details  Name: Erika Stanton MRN: [id number] Date of Birth: [birth year] Referring Provider (OT): Dr. Naaman Plummer   Encounter Date: 12/22/2020   OT End of Session - 12/24/20 2117     Visit Number 83    Number of Visits 104    Date for OT Re-Evaluation 03/09/21    Authorization Type Progress report period starting 09/21/2020    Authorization Time [place]    OT Start Time 1300    OT Stop Time 1345    OT Time Calculation (min) 45 min    Activity Tolerance Patient tolerated treatment well    Behavior During Therapy [facility] for tasks assessed/performed             Past Medical History:  Diagnosis Date   Aneurysm ([place])    [place]     Past Surgical History:  Procedure Laterality Date   CHOLECYSTECTOMY     COLONOSCOPY  11/2017   at [facility]. no recurrent polyps.  suggest repeat surveillance study 11/2022.     COLONOSCOPY W/ POLYPECTOMY  06/2014   Dr Roxy Manns at [facility]  3 adenomatoous polyps, anal fissure.     ESOPHAGOGASTRODUODENOSCOPY (EGD) WITH PROPOFOL N/A 01/27/2020   Procedure: ESOPHAGOGASTRODUODENOSCOPY (EGD) WITH PROPOFOL;  Surgeon: Mauri Pole, MD;  Location: [facility];  Service: Endoscopy;  Laterality: N/A;   IR 3D INDEPENDENT WKST  01/06/2020   IR ANGIO INTRA EXTRACRAN SEL INTERNAL CAROTID BILAT MOD SED  01/06/2020   IR ANGIO VERTEBRAL SEL VERTEBRAL UNI L MOD SED  01/06/2020   IR ANGIOGRAM FOLLOW UP STUDY  01/06/2020   IR ANGIOGRAM FOLLOW UP STUDY  01/06/2020   IR ANGIOGRAM FOLLOW UP STUDY  01/06/2020   IR ANGIOGRAM FOLLOW UP STUDY  01/06/2020   IR ANGIOGRAM FOLLOW UP STUDY  01/06/2020   IR ANGIOGRAM FOLLOW UP STUDY  01/06/2020   IR ANGIOGRAM FOLLOW UP STUDY  01/06/2020   IR ANGIOGRAM FOLLOW UP STUDY  01/06/2020   IR ANGIOGRAM FOLLOW UP STUDY  01/06/2020   IR ANGIOGRAM FOLLOW UP  STUDY  01/06/2020   IR NEURO EACH ADD'L AFTER BASIC UNI RIGHT (MS)  01/06/2020   IR TRANSCATH/EMBOLIZ  01/06/2020   RADIOLOGY WITH ANESTHESIA N/A 01/06/2020   Procedure: IR WITH ANESTHESIA FOR ANEURYSM;  Surgeon: Consuella Lose, MD;  Location: [facility];  Service: Radiology;  Laterality: N/A;   TUBAL LIGATION      There were no vitals filed for this visit.   Subjective Assessment - 12/24/20 2117     Subjective  Pt reports she is doing well, she wanted her husband to come in to go over exercises and discuss her plan of care however she forgot to tell him ahead of time, will plan for next visit.    Pertinent History ICH 01/06/20, Aneurysm. PMH: OA bilateral knees and hands    Repetition Decreases Symptoms    Patient Stated Goals Pt would like to be as independent as possible.    Currently in Pain? Yes    Pain Score 2     Pain Location Arm    Pain Orientation Right    Pain Descriptors / Indicators Aching    Pain Type Chronic pain    Pain Onset More than a month ago    Pain Frequency Intermittent    Pain Onset More than a month ago  Moist heat to right shoulder and hand  to decrease pain and increase motion for 5 mins prior to ROM and therapeutic exercises. ROM to right hand digits while heat to shoulder Therex: Pt seen for PROM to RUE, shoulder flexion to 90 degrees, (increased pain greater than 90) ABD to 85, ER, elbow flexion/extension, supination/pronation, wrist flexion/extension, digit flexion.    RUE AAROM: Shoulder flexion/abduction,ER, elbow flexion/extension, supination/pronation, wrist ext/flex, digit adduction/abduction and tapping.   Neuro: Pt seen for manipulation of one inch squares with velcro resistance to remove from grid placed below tabletop height and reaching to place squares on table.  Increased effort to complete task and requires cues and guiding from therapist for normal movement patterns.  Pt to have husband come in next session to review HEP and  answer questions he has regarding how to assist her at home.  Continue to work towards goals in plan of care to improve RUE for daily tasks.                      OT Education - 12/24/20 2117     Education Details HEP, reaching tasks    Person(s) Educated Patient    Methods Explanation;Verbal cues              OT Short Term Goals - 10/31/20 1057       OT SHORT TERM GOAL #5   Title Pt will demonstrate improved LUE fine motor coordination for ADLs as evidenced by decreasing 9 hole peg test score to 25 secs or less.    Baseline 10/31/2020: Left FMC: 32 sec. Left FMC 41 sec visa the 9 hole peg test. 08/08/20: 32 seconds via 9 hole peg    Time 6    Period Weeks    Status On-going    Target Date 12/05/20               OT Long Term Goals - 12/14/20 1024       OT LONG TERM GOAL #1   Title Pt will perform all basic ADLs with supervision    Baseline 12/13/2020 unchanged 6/202/2022: Pt. is able to donn stretchy shirts, donn slide on shoes, donning pants. Pt. has difficylty with managing a bra,shirt buttons, and pant zipper/button.Pt. requires minA from her husband following multiple recent surgeries. Pt. requires minA from her husband. 08/08/20: Pt continues to require MIN A for grooming and dressing tasks.    Time 12    Period Weeks    Status On-going    Target Date 03/09/21      OT LONG TERM GOAL #2   Title Pt will demonstrate 60* RUE shoulder flexion in prep for functional reach with pain less than or equal to 3/10.    Baseline 12/13/20:  shoulder flexion right 64 degrees 10/31/20: Pt. continues to present with limited AROM for isolated  right shoulder flexion with pain. 08/08/20: R AROM Shoulder flexion - 45* c 3/10 pain.    Time 12    Period Weeks    Status On-going    Target Date 03/09/21      OT LONG TERM GOAL #3   Title Pt will use RUE as a stabilizer/ gross A at least 30 % of the time for ADLs/ IADLs.    Baseline Pt. is using her right hand as a gross  stabilizer 15% of the time. 08/08/20: requires MAX cues    Time 12    Period Weeks    Status Achieved  OT LONG TERM GOAL #4   Title Pt will increase LUE grip strength to 25 lbs or greater for increased ease with ADLs.    Baseline 12/13/20: 24# left, 1# right 10/31/2020: Pt. continues to present with limited left grip. Pt. has improved with left grip strength. Pt.continues to work towards 25#. 08/08/20: L grip 19#    Time 12    Period Weeks    Status On-going    Target Date 03/09/21      OT LONG TERM GOAL #5   Title 10/31/2020: Pt. is now perfroming laundry, loading the dishwasher, assists some with cooking, light meal preparation, setting the table    Baseline 12/13/2020:  continues to assist as needed but uses left hand .  10/13/20: Pt. assists with filling the dishwasher, and performing laundry. Pt. continues to try to help when she can, however her husband mostly performs these tasks. 08/08/20: Husband performs    Time 12    Period Weeks    Status On-going    Target Date 03/09/21      OT LONG TERM GOAL #6   Title Pt will demonstrate ability to grasp/ release a cup 2/3 trials with RUE.    Baseline 12/13/20:  Continues to work on consistency with grasp and release 10/31/2020: Pt. continues to initiate grasp, and active release of objects, however is unable to grasp/release a cup. 08/08/20: Able to grasp, unable to release x1 trial    Time 12    Period Weeks    Status On-going    Target Date 03/09/21      OT LONG TERM GOAL #7   Title Pt will demonstrate the ability to place clothing on hangers with use of bilateral UE and modified technique.    Baseline unable    Time 12    Period Weeks    Status New    Target Date 03/09/21      OT LONG TERM GOAL #8   Title I with updated HEP.    Baseline 12/13/2020:  Continual changes to HEP 09/12/2020: Pt. conitinues to require assist for her HEP. Pt. requires assist, and cuing for HEP. 08/08/20: reports completing with husband assist for technique    Time  12    Period Weeks    Status Partially Met    Target Date 03/09/21      OT LONG TERM GOAL  #9   TITLE Pt will demonstrate the ability to don and doff robe after shower with modified independence    Baseline unable    Time 12    Period Weeks    Status New    Target Date 03/09/21      OT LONG TERM GOAL  #10   TITLE Pt will demonstrate the ability to perform zippers with modified independence and use of adaptive equipment as needed.    Baseline difficulty    Time 12    Period Weeks    Status New    Target Date 03/09/21                   Plan - 12/24/20 2118     Clinical Impression Statement Pt seen for manipulation of one inch squares with velcro resistance to remove from grid placed below tabletop height and reaching to place squares on table.  Increased effort to complete task and requires cues and guiding from therapist for normal movement patterns.  Pt to have husband come in next session to review HEP and answer questions he has  regarding how to assist her at home.  Continue to work towards goals in plan of care to improve RUE for daily tasks.    OT Occupational Profile and History Detailed Assessment- Review of Records and additional review of physical, cognitive, psychosocial history related to current functional performance    Occupational performance deficits (Please refer to evaluation for details): ADL's;IADL's;Rest and Sleep;Work;Play;Leisure    Body Structure / Function / Physical Skills ADL;Balance;Coordination;Decreased knowledge of precautions;Decreased knowledge of use of DME;Dexterity;Edema;Mobility;Tone;Strength;IADL;Sensation;GMC;Gait;ROM;FMC;Flexibility;Pain;Vision;UE functional use;Endurance    Cognitive Skills Attention;Perception;Problem Solve;Safety Awareness;Thought    Rehab Potential Good    Clinical Decision Making Several treatment options, min-mod task modification necessary    Comorbidities Affecting Occupational Performance: None    Modification  or Assistance to Complete Evaluation  Min-Moderate modification of tasks or assist with assess necessary to complete eval    OT Frequency 2x / week    OT Duration 12 weeks    OT Treatment/Interventions Self-care/ADL training;Ultrasound;Energy conservation;Visual/perceptual remediation/compensation;Patient/family education;DME and/or AE instruction;Aquatic Therapy;Paraffin;Gait Training;Passive range of motion;Balance training;Fluidtherapy;Electrical Stimulation;Functional Mobility Training;Splinting;Moist Heat;Therapeutic exercise;Manual Therapy;Cognitive remediation/compensation;Manual lymph drainage;Neuromuscular education;Coping strategies training    Plan Pt is transferring her care to [facility] as it is closer to home. She remains very fearful of moving RUE and responds well to heat, and estim.    Consulted and Agree with Plan of Care Patient             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, unspecified laterality (HCC)  Acute pain of right shoulder  Pain in right hand    Problem List Patient Active Problem List   Diagnosis Date Noted   Dyspareunia in female 04/05/2020   History of abnormal cervical Pap smear 03/10/2020   GIB (gastrointestinal bleeding) 02/28/2020   Elevated BUN    Prediabetes    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   Cerebral vasospasm    Sinus tachycardia     Dysphagia, post-stroke    Thrombocytopenia (HCC)    Acute blood loss anemia    Brain aneurysm    ICH (intracerebral hemorrhage) ([place]) 01/05/2020   History of adenomatous polyp of colon 02/07/2016   Anatomical narrow angle, bilateral 12/14/2014   Fissure in ano 11/11/2014   Hemorrhoids 11/11/2014   Constipation 11/19/2013   GERD (gastroesophageal reflux disease) 03/24/2013   Irritable bowel syndrome with diarrhea 03/24/2013   Herpes zoster 05/14/2005   Abnormal Pap smear of cervix 05/15/1983   History of cervical dysplasia 05/15/1983   Erika Stanton T Aalayah Riles, OTR/L, CLT  Kess Mcilwain 12/25/2020, 9:25 PM  [place] [address] [place], [place], [address] Phone: [phone]   Fax:  [phone]  Name: AIYANA STEGMANN MRN: [id number] Date of Birth: [birth year]

## 2020-12-25 ENCOUNTER — Encounter: Payer: Self-pay | Admitting: Occupational Therapy

## 2020-12-26 ENCOUNTER — Ambulatory Visit: Payer: BC Managed Care – PPO | Admitting: Occupational Therapy

## 2020-12-27 ENCOUNTER — Other Ambulatory Visit: Payer: Self-pay

## 2020-12-27 ENCOUNTER — Ambulatory Visit: Payer: BC Managed Care – PPO | Admitting: Occupational Therapy

## 2020-12-27 ENCOUNTER — Encounter: Payer: Self-pay | Admitting: Occupational Therapy

## 2020-12-27 DIAGNOSIS — M6281 Muscle weakness (generalized): Secondary | ICD-10-CM

## 2020-12-27 DIAGNOSIS — M25511 Pain in right shoulder: Secondary | ICD-10-CM

## 2020-12-27 DIAGNOSIS — M79641 Pain in right hand: Secondary | ICD-10-CM

## 2020-12-27 DIAGNOSIS — I69159 Hemiplegia and hemiparesis following nontraumatic intracerebral hemorrhage affecting unspecified side: Secondary | ICD-10-CM

## 2020-12-27 DIAGNOSIS — R278 Other lack of coordination: Secondary | ICD-10-CM

## 2020-12-27 NOTE — Therapy (Signed)
[place] MAIN [facility] SERVICES [address] [place], [place], [address] Phone: [phone]   Fax:  [phone]  Occupational Therapy Treatment  Patient Details  Name: Erika Stanton MRN: [id number] Date of Birth: Sep 26, 1955 Referring Provider (OT): Dr. Naaman Plummer   Encounter Date: 12/27/2020   OT End of Session - 12/27/20 1411     Visit Number 64    Number of Visits 104    Date for OT Re-Evaluation 03/09/21    Authorization Type Progress report period starting 09/21/2020    Authorization Time [place]    OT Start Time 1300    OT Stop Time 1347    OT Time Calculation (min) 47 min    Activity Tolerance Patient tolerated treatment well    Behavior During Therapy [facility] for tasks assessed/performed             Past Medical History:  Diagnosis Date   Aneurysm ([place])    Melena     Past Surgical History:  Procedure Laterality Date   CHOLECYSTECTOMY     COLONOSCOPY  11/2017   at [facility]. no recurrent polyps.  suggest repeat surveillance study 11/2022.     COLONOSCOPY W/ POLYPECTOMY  06/2014   Dr Roxy Manns at [facility].  3 adenomatoous polyps, anal fissure.     ESOPHAGOGASTRODUODENOSCOPY (EGD) WITH PROPOFOL N/A 01/27/2020   Procedure: ESOPHAGOGASTRODUODENOSCOPY (EGD) WITH PROPOFOL;  Surgeon: Mauri Pole, MD;  Location: [place] [facility];  Service: Endoscopy;  Laterality: N/A;   IR 3D INDEPENDENT WKST  01/06/2020   IR ANGIO INTRA EXTRACRAN SEL INTERNAL CAROTID BILAT MOD SED  01/06/2020   IR ANGIO VERTEBRAL SEL VERTEBRAL UNI L MOD SED  01/06/2020   IR ANGIOGRAM FOLLOW UP STUDY  01/06/2020   IR ANGIOGRAM FOLLOW UP STUDY  01/06/2020   IR ANGIOGRAM FOLLOW UP STUDY  01/06/2020   IR ANGIOGRAM FOLLOW UP STUDY  01/06/2020   IR ANGIOGRAM FOLLOW UP STUDY  01/06/2020   IR ANGIOGRAM FOLLOW UP STUDY  01/06/2020   IR ANGIOGRAM FOLLOW UP STUDY  01/06/2020   IR ANGIOGRAM FOLLOW UP STUDY  01/06/2020   IR ANGIOGRAM FOLLOW UP STUDY  01/06/2020   IR ANGIOGRAM FOLLOW UP  STUDY  01/06/2020   IR NEURO EACH ADD'L AFTER BASIC UNI RIGHT (MS)  01/06/2020   IR TRANSCATH/EMBOLIZ  01/06/2020   RADIOLOGY WITH ANESTHESIA N/A 01/06/2020   Procedure: IR WITH ANESTHESIA FOR ANEURYSM;  Surgeon: Consuella Lose, MD;  Location: [place];  Service: Radiology;  Laterality: N/A;   TUBAL LIGATION      There were no vitals filed for this visit.   Subjective Assessment - 12/27/20 1407     Subjective  Pt's husband came in today during session to get an update on her progress and see what she is doing in therapy and what kinds of recommendations are made for home and home program.    Patient is accompanied by: Family member    Pertinent History ICH 01/06/20, Aneurysm. PMH: OA bilateral knees and hands    Patient Stated Goals Pt would like to be as independent as possible.    Currently in Pain? Yes    Pain Score 2     Pain Location Arm    Pain Orientation Right    Pain Descriptors / Indicators Aching    Pain Onset More than a month ago    Pain Frequency Intermittent    Multiple Pain Sites No  Applied moist heat to right shoulder and hand for 5 mins prior to ROM and therapeutic exercises.   While heat applied, discussed patient's progress, goals and current status with husband and patient.  Offered the opportunity for questions regarding care and answered all questions.  Husband reports he would like to see patient use right hand more if possible in her daily interactions and reports she has been engaging in more tasks at home but tends to still use her left UE for most tasks.  Further discussion regarding the topic while performing ROM and exercises during session.     Therapeutic Exercise: Pt seen for PROM to RUE for shoulder flexion, ABD, ADD, ER, elbow flexion/extension, forearm supination/pronation, wrist flexion/extension, digit flexion/extension.  Followed by Sinclair Ship of all movements with facilitation.  Shoulder flexion to 100 with guiding, ABD to 90, supination to  70.  Attempted opposition of thumb to index, increased difficulty but able to perform for a few reps after guiding from therapist, unable to oppose to middle finger or beyond.  Encouraged pt to work on oppositional movements to index and middle finger over the next few days.   Grasp and release of a variety sized ball such as ping pong ball, golf ball and slightly larger, use of right hand to grasp and then place into container on tabletop, cues for shoulder movements and decreased hiking.   Discussed formulating a more comprehensive list of home exercises/tasks to choose from daily, we will work on this over the next week.  Also discussed accountability, use of timers/alarms to indicate time for HEP, etc.   Response to tx: Pt's husband present during session today to observe, review exercises and goals/POC.  All questions answered and the plan is to also formulate a more comprehensive list of exercises and tasks she can pick from on a daily basis and attempt to put in a little more structure and accountability for engaging right hand into tasks at home.  She will be receiving another round of Dysport at the end of next week and will continue to benefit from therapy in conjunction to the injections.  She continues to demonstrate increased tone and spasticity in her right hand affecting more of her ring and small finger which limits many of her grasping and releasing tasks.  Continued to encourage her to engage in daily tasks with use of right UE as much as possible even using as an assist or with just weight bearing onto hand or forearm while performing tasks with left UE.  She demonstrates understanding.  Continue towards goals in plan of care to maximize safety and independence in necessary daily tasks.                      OT Education - 12/27/20 1410     Education Details review of progress with pt and husband, home exercises, recommendations for use of RUE at home with functional  tasks.    Person(s) Educated Patient    Methods Explanation;Verbal cues;Demonstration    Comprehension Verbalized understanding              OT Short Term Goals - 10/31/20 1057       OT SHORT TERM GOAL #5   Title Pt will demonstrate improved LUE fine motor coordination for ADLs as evidenced by decreasing 9 hole peg test score to 25 secs or less.    Baseline 10/31/2020: Left FMC: 32 sec. Left FMC 41 sec visa the 9 hole peg test. 08/08/20:  32 seconds via 9 hole peg    Time 6    Period Weeks    Status On-going    Target Date 12/05/20               OT Long Term Goals - 12/14/20 1024       OT LONG TERM GOAL #1   Title Pt will perform all basic ADLs with supervision    Baseline 12/13/2020 unchanged 6/202/2022: Pt. is able to donn stretchy shirts, donn slide on shoes, donning pants. Pt. has difficylty with managing a bra,shirt buttons, and pant zipper/button.Pt. requires minA from her husband following multiple recent surgeries. Pt. requires minA from her husband. 08/08/20: Pt continues to require MIN A for grooming and dressing tasks.    Time 12    Period Weeks    Status On-going    Target Date 03/09/21      OT LONG TERM GOAL #2   Title Pt will demonstrate 60* RUE shoulder flexion in prep for functional reach with pain less than or equal to 3/10.    Baseline 12/13/20:  shoulder flexion right 64 degrees 10/31/20: Pt. continues to present with limited AROM for isolated  right shoulder flexion with pain. 08/08/20: R AROM Shoulder flexion - 45* c 3/10 pain.    Time 12    Period Weeks    Status On-going    Target Date 03/09/21      OT LONG TERM GOAL #3   Title Pt will use RUE as a stabilizer/ gross A at least 30 % of the time for ADLs/ IADLs.    Baseline Pt. is using her right hand as a gross stabilizer 15% of the time. 08/08/20: requires MAX cues    Time 12    Period Weeks    Status Achieved      OT LONG TERM GOAL #4   Title Pt will increase LUE grip strength to 25 lbs or greater  for increased ease with ADLs.    Baseline 12/13/20: 24# left, 1# right 10/31/2020: Pt. continues to present with limited left grip. Pt. has improved with left grip strength. Pt.continues to work towards 25#. 08/08/20: L grip 19#    Time 12    Period Weeks    Status On-going    Target Date 03/09/21      OT LONG TERM GOAL #5   Title 10/31/2020: Pt. is now perfroming laundry, loading the dishwasher, assists some with cooking, light meal preparation, setting the table    Baseline 12/13/2020:  continues to assist as needed but uses left hand .  10/13/20: Pt. assists with filling the dishwasher, and performing laundry. Pt. continues to try to help when she can, however her husband mostly performs these tasks. 08/08/20: Husband performs    Time 12    Period Weeks    Status On-going    Target Date 03/09/21      OT LONG TERM GOAL #6   Title Pt will demonstrate ability to grasp/ release a cup 2/3 trials with RUE.    Baseline 12/13/20:  Continues to work on consistency with grasp and release 10/31/2020: Pt. continues to initiate grasp, and active release of objects, however is unable to grasp/release a cup. 08/08/20: Able to grasp, unable to release x1 trial    Time 12    Period Weeks    Status On-going    Target Date 03/09/21      OT LONG TERM GOAL #7   Title Pt will demonstrate the ability  to place clothing on hangers with use of bilateral UE and modified technique.    Baseline unable    Time 12    Period Weeks    Status New    Target Date 03/09/21      OT LONG TERM GOAL #8   Title I with updated HEP.    Baseline 12/13/2020:  Continual changes to HEP 09/12/2020: Pt. conitinues to require assist for her HEP. Pt. requires assist, and cuing for HEP. 08/08/20: reports completing with husband assist for technique    Time 12    Period Weeks    Status Partially Met    Target Date 03/09/21      OT LONG TERM GOAL  #9   TITLE Pt will demonstrate the ability to don and doff robe after shower with modified  independence    Baseline unable    Time 12    Period Weeks    Status New    Target Date 03/09/21      OT LONG TERM GOAL  #10   TITLE Pt will demonstrate the ability to perform zippers with modified independence and use of adaptive equipment as needed.    Baseline difficulty    Time 12    Period Weeks    Status New    Target Date 03/09/21                   Plan - 12/27/20 1412     Clinical Impression Statement Pt's husband present during session today to observe, review exercises and goals/POC.  All questions answered and the plan is to also formulate a more comprehensive list of exercises and tasks she can pick from on a daily basis and attempt to put in a little more structure and accountability for engaging right hand into tasks at home.  She will be receiving another round of Dysport at the end of next week and will continue to benefit from therapy in conjunction to the injections.  She continues to demonstrate increased tone and spasticity in her right hand affecting more of her ring and small finger which limits many of her grasping and releasing tasks.  Continued to encourage her to engage in daily tasks with use of right UE as much as possible even using as an assist or with just weight bearing onto hand or forearm while performing tasks with left UE.  She demonstrates understanding.  Continue towards goals in plan of care to maximize safety and independence in necessary daily tasks.    OT Occupational Profile and History Detailed Assessment- Review of Records and additional review of physical, cognitive, psychosocial history related to current functional performance    Occupational performance deficits (Please refer to evaluation for details): ADL's;IADL's;Rest and Sleep;Work;Play;Leisure    Body Structure / Function / Physical Skills ADL;Balance;Coordination;Decreased knowledge of precautions;Decreased knowledge of use of  DME;Dexterity;Edema;Mobility;Tone;Strength;IADL;Sensation;GMC;Gait;ROM;FMC;Flexibility;Pain;Vision;UE functional use;Endurance    Cognitive Skills Attention;Perception;Problem Solve;Safety Awareness;Thought    Rehab Potential Good    Clinical Decision Making Several treatment options, min-mod task modification necessary    Comorbidities Affecting Occupational Performance: None    Modification or Assistance to Complete Evaluation  Min-Moderate modification of tasks or assist with assess necessary to complete eval    OT Frequency 2x / week    OT Duration 12 weeks    OT Treatment/Interventions Self-care/ADL training;Ultrasound;Energy conservation;Visual/perceptual remediation/compensation;Patient/family education;DME and/or AE instruction;Aquatic Therapy;Paraffin;Gait Training;Passive range of motion;Balance training;Fluidtherapy;Electrical Stimulation;Functional Mobility Training;Splinting;Moist Heat;Therapeutic exercise;Manual Therapy;Cognitive remediation/compensation;Manual lymph drainage;Neuromuscular education;Coping strategies training    Plan  Pt is transferring her care to [facility] as it is closer to home. She remains very fearful of moving RUE and responds well to heat, and estim.    Consulted and Agree with Plan of Care Patient             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, unspecified  laterality (HCC)  Acute pain of right shoulder  Pain in right hand    Problem List Patient Active Problem List   Diagnosis Date Noted   Dyspareunia in female 04/05/2020   History of abnormal cervical Pap smear 03/10/2020   GIB (gastrointestinal bleeding) 02/28/2020   Elevated BUN    Prediabetes    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   Cerebral vasospasm    Sinus tachycardia    Dysphagia, post-stroke    Thrombocytopenia (HCC)    Acute blood loss anemia    Brain aneurysm    ICH (intracerebral hemorrhage) ([place]) 01/05/2020   History of adenomatous polyp of colon 02/07/2016   Anatomical narrow angle, 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 [place], [place], [address] Phone: [phone]   Fax:  [phone]  Name: TRISTAN BRAMBLE MRN: [id number] Date of Birth: 06/18/1955

## 2020-12-29 ENCOUNTER — Ambulatory Visit: Payer: BC Managed Care – PPO | Admitting: Occupational Therapy

## 2020-12-29 ENCOUNTER — Encounter: Payer: Self-pay | Admitting: Occupational Therapy

## 2020-12-29 ENCOUNTER — Other Ambulatory Visit: Payer: Self-pay

## 2020-12-29 DIAGNOSIS — R278 Other lack of coordination: Secondary | ICD-10-CM

## 2020-12-29 DIAGNOSIS — M6281 Muscle weakness (generalized): Secondary | ICD-10-CM | POA: Diagnosis not present

## 2020-12-29 NOTE — Therapy (Signed)
[place] [facility] SERVICES [address] [place], [place], [address] Phone: [phone]   Fax:  [phone]  Occupational Therapy Treatment  Patient Details  Name: Erika Stanton MRN: [id number] Date of Birth: 1955/06/19 Referring Provider (OT): Dr. Naaman Plummer   Encounter Date: 12/29/2020   OT End of Session - 12/29/20 1516     Visit Number [id number]    Number of Visits 104    Date for OT Re-Evaluation 03/09/21    Authorization Type Progress report period starting 09/21/2020    Authorization Time Period State [employer]    OT Start Time 1300    OT Stop Time 1345    OT Time Calculation (min) 45 min    Activity Tolerance Patient tolerated treatment well    Behavior During Therapy [facility] for tasks assessed/performed             Past Medical History:  Diagnosis Date   Aneurysm ([place])    Melena     Past Surgical History:  Procedure Laterality Date   CHOLECYSTECTOMY     COLONOSCOPY  11/2017   at [facility]. no recurrent polyps.  suggest repeat surveillance study 11/2022.     COLONOSCOPY W/ POLYPECTOMY  06/2014   Dr Roxy Manns at [facility].  3 adenomatoous polyps, anal fissure.     ESOPHAGOGASTRODUODENOSCOPY (EGD) WITH PROPOFOL N/A 01/27/2020   Procedure: ESOPHAGOGASTRODUODENOSCOPY (EGD) WITH PROPOFOL;  Surgeon: Mauri Pole, MD;  Location: [place] [facility];  Service: Endoscopy;  Laterality: N/A;   IR 3D INDEPENDENT WKST  01/06/2020   IR ANGIO INTRA EXTRACRAN SEL INTERNAL CAROTID BILAT MOD SED  01/06/2020   IR ANGIO VERTEBRAL SEL VERTEBRAL UNI L MOD SED  01/06/2020   IR ANGIOGRAM FOLLOW UP STUDY  01/06/2020   IR ANGIOGRAM FOLLOW UP STUDY  01/06/2020   IR ANGIOGRAM FOLLOW UP STUDY  01/06/2020   IR ANGIOGRAM FOLLOW UP STUDY  01/06/2020   IR ANGIOGRAM FOLLOW UP STUDY  01/06/2020   IR ANGIOGRAM FOLLOW UP STUDY  01/06/2020   IR ANGIOGRAM FOLLOW UP STUDY  01/06/2020   IR ANGIOGRAM FOLLOW UP STUDY  01/06/2020   IR ANGIOGRAM FOLLOW UP STUDY  01/06/2020   IR ANGIOGRAM FOLLOW UP  STUDY  01/06/2020   IR NEURO EACH ADD'L AFTER BASIC UNI RIGHT (MS)  01/06/2020   IR TRANSCATH/EMBOLIZ  01/06/2020   RADIOLOGY WITH ANESTHESIA N/A 01/06/2020   Procedure: IR WITH ANESTHESIA FOR ANEURYSM;  Surgeon: Consuella Lose, MD;  Location: [place];  Service: Radiology;  Laterality: N/A;   TUBAL LIGATION      There were no vitals filed for this visit.   Subjective Assessment - 12/29/20 1514     Subjective  Pt. reports planning her fall vacations around her anticipated surgery.    Patient is accompanied by: Family member    Pertinent History ICH 01/06/20, Aneurysm. PMH: OA bilateral knees and hands    Patient Stated Goals Pt would like to be as independent as possible.    Currently in Pain? Yes    Pain Score 2     Pain Location Arm    Pain Orientation Right    Pain Descriptors / Indicators Aching    Pain Type Chronic pain             OT TREATMENT     Therapeutic Exercise:   Pt. was able to tolerate AAROM/PROM for shoulder flexion, abduction, elbow flexion, elbow extension, AAROM supination PROM for right wrist, digit MP, PIP, and DIP flexion, and extension,  AROM digit MP, PIP, and DIP flexion, extension.Thumb radial, and palmar abduction, Thumb IP flexion, and extension in preparation for grasping objects.   Neuromuscular re-ed:   Pt. worked on [facility] skills formulating functional grasp patterns while grasping, and removing 1" circular pegs from a pegboard positioned at a flat tabletop surface. Pt. continues to work on improving UE strength, and [facility] skills. Support was provided at her right elbow when reaching to the tabletop surface. Emphasis was placed of digit extension when releasing the circular pegs.   Pt. presents with intermittent 2/10 pain in her right hand/thumb. Pt. continues to tolerate moist heat prior to, and in conjunction with ROM. Pt. continues to tolerate ROM well today. Pt. continues to present with Improving active digit grasp, and release with her right thumb,  2nd, and 3rd digits.  Pt. continues to work on improving RUE ROM, and functional reaching in preparation for engaging her UE during ADLs, and IADL tasks. Pt. continues to work on improving RUE ROM, and functional hand use during ADLs, and IADL tasks.                         OT Education - 12/29/20 1515     Education Details RUE ROM, functional hand use.    Person(s) Educated Patient    Methods Explanation;Verbal cues;Demonstration    Comprehension Verbalized understanding              OT Short Term Goals - 10/31/20 1057       OT SHORT TERM GOAL #5   Title Pt will demonstrate improved LUE fine motor coordination for ADLs as evidenced by decreasing 9 hole peg test score to 25 secs or less.    Baseline 10/31/2020: Left FMC: 32 sec. Left FMC 41 sec visa the 9 hole peg test. 08/08/20: 32 seconds via 9 hole peg    Time 6    Period Weeks    Status On-going    Target Date 12/05/20               OT Long Term Goals - 12/14/20 1024       OT LONG TERM GOAL #1   Title Pt will perform all basic ADLs with supervision    Baseline 12/13/2020 unchanged 6/202/2022: Pt. is able to donn stretchy shirts, donn slide on shoes, donning pants. Pt. has difficylty with managing a bra,shirt buttons, and pant zipper/button.Pt. requires minA from her husband following multiple recent surgeries. Pt. requires minA from her husband. 08/08/20: Pt continues to require MIN A for grooming and dressing tasks.    Time 12    Period Weeks    Status On-going    Target Date 03/09/21      OT LONG TERM GOAL #2   Title Pt will demonstrate 60* RUE shoulder flexion in prep for functional reach with pain less than or equal to 3/10.    Baseline 12/13/20:  shoulder flexion right 64 degrees 10/31/20: Pt. continues to present with limited AROM for isolated  right shoulder flexion with pain. 08/08/20: R AROM Shoulder flexion - 45* c 3/10 pain.    Time 12    Period Weeks    Status On-going    Target Date  03/09/21      OT LONG TERM GOAL #3   Title Pt will use RUE as a stabilizer/ gross A at least 30 % of the time for ADLs/ IADLs.    Baseline Pt. is using her right hand as  a gross stabilizer 15% of the time. 08/08/20: requires MAX cues    Time 12    Period Weeks    Status Achieved      OT LONG TERM GOAL #4   Title Pt will increase LUE grip strength to 25 lbs or greater for increased ease with ADLs.    Baseline 12/13/20: 24# left, 1# right 10/31/2020: Pt. continues to present with limited left grip. Pt. has improved with left grip strength. Pt.continues to work towards 25#. 08/08/20: L grip 19#    Time 12    Period Weeks    Status On-going    Target Date 03/09/21      OT LONG TERM GOAL #5   Title 10/31/2020: Pt. is now perfroming laundry, loading the dishwasher, assists some with cooking, light meal preparation, setting the table    Baseline 12/13/2020:  continues to assist as needed but uses left hand .  10/13/20: Pt. assists with filling the dishwasher, and performing laundry. Pt. continues to try to help when she can, however her husband mostly performs these tasks. 08/08/20: Husband performs    Time 12    Period Weeks    Status On-going    Target Date 03/09/21      OT LONG TERM GOAL #6   Title Pt will demonstrate ability to grasp/ release a cup 2/3 trials with RUE.    Baseline 12/13/20:  Continues to work on consistency with grasp and release 10/31/2020: Pt. continues to initiate grasp, and active release of objects, however is unable to grasp/release a cup. 08/08/20: Able to grasp, unable to release x1 trial    Time 12    Period Weeks    Status On-going    Target Date 03/09/21      OT LONG TERM GOAL #7   Title Pt will demonstrate the ability to place clothing on hangers with use of bilateral UE and modified technique.    Baseline unable    Time 12    Period Weeks    Status New    Target Date 03/09/21      OT LONG TERM GOAL #8   Title I with updated HEP.    Baseline 12/13/2020:  Continual  changes to HEP 09/12/2020: Pt. conitinues to require assist for her HEP. Pt. requires assist, and cuing for HEP. 08/08/20: reports completing with husband assist for technique    Time 12    Period Weeks    Status Partially Met    Target Date 03/09/21      OT LONG TERM GOAL  #9   TITLE Pt will demonstrate the ability to don and doff robe after shower with modified independence    Baseline unable    Time 12    Period Weeks    Status New    Target Date 03/09/21      OT LONG TERM GOAL  #10   TITLE Pt will demonstrate the ability to perform zippers with modified independence and use of adaptive equipment as needed.    Baseline difficulty    Time 12    Period Weeks    Status New    Target Date 03/09/21                   Plan - 12/29/20 1813     Clinical Impression Statement Pt. presents with intermittent 2/10 pain in her right hand/thumb. Pt. continues to tolerate moist heat prior to, and in conjunction with ROM. Pt. continues to tolerate ROM well  today. Pt. continues to present with Improving active digit grasp, and release with her right thumb, 2nd, and 3rd digits.  Pt. continues to work on improving RUE ROM, and functional reaching in preparation for engaging her UE during ADLs, and IADL tasks. Pt. continues to work on improving RUE ROM, and functional hand use during ADLs, and IADL tasks.       OT Occupational Profile and History Detailed Assessment- Review of Records and additional review of physical, cognitive, psychosocial history related to current functional performance    Occupational performance deficits (Please refer to evaluation for details): ADL's;IADL's;Rest and Sleep;Work;Play;Leisure    Body Structure / Function / Physical Skills ADL;Balance;Coordination;Decreased knowledge of precautions;Decreased knowledge of use of DME;Dexterity;Edema;Mobility;Tone;Strength;IADL;Sensation;GMC;Gait;ROM;FMC;Flexibility;Pain;Vision;UE functional use;Endurance    Cognitive Skills  Attention;Perception;Problem Solve;Safety Awareness;Thought    Rehab Potential Good    Clinical Decision Making Several treatment options, min-mod task modification necessary    Comorbidities Affecting Occupational Performance: None    Modification or Assistance to Complete Evaluation  Min-Moderate modification of tasks or assist with assess necessary to complete eval    OT Frequency 2x / week    OT Duration 12 weeks    OT Treatment/Interventions Self-care/ADL training;Ultrasound;Energy conservation;Visual/perceptual remediation/compensation;Patient/family education;DME and/or AE instruction;Aquatic Therapy;Paraffin;Gait Training;Passive range of motion;Balance training;Fluidtherapy;Electrical Stimulation;Functional Mobility Training;Splinting;Moist Heat;Therapeutic exercise;Manual Therapy;Cognitive remediation/compensation;Manual lymph drainage;Neuromuscular education;Coping strategies training    Plan Pt is transferring her care to [facility] as it is closer to home. She remains very fearful of moving RUE and responds well to heat, and estim.    Consulted and Agree with Plan of Care Patient             Patient will benefit from skilled therapeutic intervention in order to improve the following deficits and impairments:   Body Structure / Function / Physical Skills: ADL, Balance, Coordination, Decreased knowledge of precautions, Decreased knowledge of use of DME, Dexterity, Edema, Mobility, Tone, Strength, IADL, Sensation, GMC, Gait, ROM, FMC, Flexibility, Pain, Vision, UE functional use, Endurance Cognitive Skills: Attention, Perception, Problem Solve, Safety Awareness, Thought     Visit Diagnosis: Muscle weakness (generalized)  Other lack of coordination    Problem List Patient Active Problem List   Diagnosis Date Noted   Dyspareunia in female 04/05/2020   History of abnormal cervical Pap smear 03/10/2020   GIB (gastrointestinal bleeding) 02/28/2020   Elevated BUN    Prediabetes     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   Cerebral vasospasm    Sinus tachycardia    Dysphagia, post-stroke    Thrombocytopenia ([facility])    Acute blood loss anemia    Brain aneurysm    ICH (intracerebral hemorrhage) ([place]) 01/05/2020   History of adenomatous polyp of colon 02/07/2016   Anatomical narrow angle, 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

## 2021-01-02 ENCOUNTER — Ambulatory Visit: Payer: BC Managed Care – PPO | Admitting: Occupational Therapy

## 2021-01-03 ENCOUNTER — Ambulatory Visit: Payer: BC Managed Care – PPO | Admitting: Occupational Therapy

## 2021-01-03 ENCOUNTER — Encounter: Payer: Self-pay | Admitting: Occupational Therapy

## 2021-01-03 ENCOUNTER — Other Ambulatory Visit: Payer: Self-pay

## 2021-01-03 DIAGNOSIS — M6281 Muscle weakness (generalized): Secondary | ICD-10-CM | POA: Diagnosis not present

## 2021-01-03 DIAGNOSIS — R278 Other lack of coordination: Secondary | ICD-10-CM

## 2021-01-03 DIAGNOSIS — M79641 Pain in right hand: Secondary | ICD-10-CM

## 2021-01-03 DIAGNOSIS — M25511 Pain in right shoulder: Secondary | ICD-10-CM

## 2021-01-03 DIAGNOSIS — I69159 Hemiplegia and hemiparesis following nontraumatic intracerebral hemorrhage affecting unspecified side: Secondary | ICD-10-CM

## 2021-01-03 NOTE — Therapy (Signed)
[place] MAIN [facility] SERVICES [address] [place], [place], [address] Phone: [phone]   Fax:  [phone]  Occupational Therapy Treatment  Patient Details  Name: Erika Stanton MRN: [id number] Date of Birth: June 28, 1955 Referring Provider (OT): Dr. Naaman Plummer   Encounter Date: 01/03/2021   OT End of Session - 01/04/21 1810     Visit Number 70    Number of Visits 104    Date for OT Re-Evaluation 03/09/21    Authorization Type Progress report period starting 09/21/2020    Authorization Time Period State [employer]    OT Start Time 1301    OT Stop Time 1346    OT Time Calculation (min) 45 min    Activity Tolerance Patient tolerated treatment well    Behavior During Therapy [facility] for tasks assessed/performed             Past Medical History:  Diagnosis Date   Aneurysm ([place])    Melena     Past Surgical History:  Procedure Laterality Date   CHOLECYSTECTOMY     COLONOSCOPY  11/2017   at [facility]. no recurrent polyps.  suggest repeat surveillance study 11/2022.     COLONOSCOPY W/ POLYPECTOMY  06/2014   Dr Roxy Manns at [facility].  3 adenomatoous polyps, anal fissure.     ESOPHAGOGASTRODUODENOSCOPY (EGD) WITH PROPOFOL N/A 01/27/2020   Procedure: ESOPHAGOGASTRODUODENOSCOPY (EGD) WITH PROPOFOL;  Surgeon: Mauri Pole, MD;  Location: [facility];  Service: Endoscopy;  Laterality: N/A;   IR 3D INDEPENDENT WKST  01/06/2020   IR ANGIO INTRA EXTRACRAN SEL INTERNAL CAROTID BILAT MOD SED  01/06/2020   IR ANGIO VERTEBRAL SEL VERTEBRAL UNI L MOD SED  01/06/2020   IR ANGIOGRAM FOLLOW UP STUDY  01/06/2020   IR ANGIOGRAM FOLLOW UP STUDY  01/06/2020   IR ANGIOGRAM FOLLOW UP STUDY  01/06/2020   IR ANGIOGRAM FOLLOW UP STUDY  01/06/2020   IR ANGIOGRAM FOLLOW UP STUDY  01/06/2020   IR ANGIOGRAM FOLLOW UP STUDY  01/06/2020   IR ANGIOGRAM FOLLOW UP STUDY  01/06/2020   IR ANGIOGRAM FOLLOW UP STUDY  01/06/2020   IR ANGIOGRAM FOLLOW UP STUDY  01/06/2020   IR ANGIOGRAM FOLLOW UP  STUDY  01/06/2020   IR NEURO EACH ADD'L AFTER BASIC UNI RIGHT (MS)  01/06/2020   IR TRANSCATH/EMBOLIZ  01/06/2020   RADIOLOGY WITH ANESTHESIA N/A 01/06/2020   Procedure: IR WITH ANESTHESIA FOR ANEURYSM;  Surgeon: Consuella Lose, MD;  Location: [place];  Service: Radiology;  Laterality: N/A;   TUBAL LIGATION      There were no vitals filed for this visit.   Subjective Assessment - 01/04/21 1809     Subjective  Pt reports her surgery is Sept 26, add more coils to her aneurysm.  Dysport injection at the end of this week.    Pertinent History ICH 01/06/20, Aneurysm. PMH: OA bilateral knees and hands    Patient Stated Goals Pt would like to be as independent as possible.    Currently in Pain? No/denies    Pain Score 0-No pain            Moist heat to right shoulder, forearm and hand for 5 mins  prior to tx session to decrease pain and increase motion and prepare UE for therapeutic exercises.  Therex:  Following moist heat, Pt seen for PROM to RUE for shoulder flexion, ABD, ADD, ER, elbow flexion/extension, forearm supination/pronation, wrist flexion/extension, digit flexion/extension.  Followed by Sinclair Ship of all movements with facilitation.  Shoulder flexion to 105 with guiding, ABD to 90, supination to 70.  Opposition of thumb to index and towards middle finger.   Gross grasp and release with [place] discs, picking up from tabletop and placing into bucket in lap.  Pt unable to turn/flip discs with isolated finger movements.    Response to tx: Pt continuing to demonstrate limitations in active ROM and strength in right UE, she is engaging in daily home exercises with the assistance of her husband.  Reaching patterns improved with placing and moving items from lap to tabletop.  Decreased isolated finger movements for manipulation skills.  Continue towards goals to improve right UE functional use for daily tasks.                    OT Education - 01/04/21 1809     Education  Details RUE ROM, functional hand use.    Person(s) Educated Patient    Methods Explanation;Verbal cues;Demonstration    Comprehension Verbalized understanding              OT Short Term Goals - 10/31/20 1057       OT SHORT TERM GOAL #5   Title Pt will demonstrate improved LUE fine motor coordination for ADLs as evidenced by decreasing 9 hole peg test score to 25 secs or less.    Baseline 10/31/2020: Left FMC: 32 sec. Left FMC 41 sec visa the 9 hole peg test. 08/08/20: 32 seconds via 9 hole peg    Time 6    Period Weeks    Status On-going    Target Date 12/05/20               OT Long Term Goals - 12/14/20 1024       OT LONG TERM GOAL #1   Title Pt will perform all basic ADLs with supervision    Baseline 12/13/2020 unchanged 6/202/2022: Pt. is able to donn stretchy shirts, donn slide on shoes, donning pants. Pt. has difficylty with managing a bra,shirt buttons, and pant zipper/button.Pt. requires minA from her husband following multiple recent surgeries. Pt. requires minA from her husband. 08/08/20: Pt continues to require MIN A for grooming and dressing tasks.    Time 12    Period Weeks    Status On-going    Target Date 03/09/21      OT LONG TERM GOAL #2   Title Pt will demonstrate 60* RUE shoulder flexion in prep for functional reach with pain less than or equal to 3/10.    Baseline 12/13/20:  shoulder flexion right 64 degrees 10/31/20: Pt. continues to present with limited AROM for isolated  right shoulder flexion with pain. 08/08/20: R AROM Shoulder flexion - 45* c 3/10 pain.    Time 12    Period Weeks    Status On-going    Target Date 03/09/21      OT LONG TERM GOAL #3   Title Pt will use RUE as a stabilizer/ gross A at least 30 % of the time for ADLs/ IADLs.    Baseline Pt. is using her right hand as a gross stabilizer 15% of the time. 08/08/20: requires MAX cues    Time 12    Period Weeks    Status Achieved      OT LONG TERM GOAL #4   Title Pt will increase LUE grip  strength to 25 lbs or greater for increased ease with ADLs.    Baseline 12/13/20: 24# left, 1# right 10/31/2020: Pt. continues  to present with limited left grip. Pt. has improved with left grip strength. Pt.continues to work towards 25#. 08/08/20: L grip 19#    Time 12    Period Weeks    Status On-going    Target Date 03/09/21      OT LONG TERM GOAL #5   Title 10/31/2020: Pt. is now perfroming laundry, loading the dishwasher, assists some with cooking, light meal preparation, setting the table    Baseline 12/13/2020:  continues to assist as needed but uses left hand .  10/13/20: Pt. assists with filling the dishwasher, and performing laundry. Pt. continues to try to help when she can, however her husband mostly performs these tasks. 08/08/20: Husband performs    Time 12    Period Weeks    Status On-going    Target Date 03/09/21      OT LONG TERM GOAL #6   Title Pt will demonstrate ability to grasp/ release a cup 2/3 trials with RUE.    Baseline 12/13/20:  Continues to work on consistency with grasp and release 10/31/2020: Pt. continues to initiate grasp, and active release of objects, however is unable to grasp/release a cup. 08/08/20: Able to grasp, unable to release x1 trial    Time 12    Period Weeks    Status On-going    Target Date 03/09/21      OT LONG TERM GOAL #7   Title Pt will demonstrate the ability to place clothing on hangers with use of bilateral UE and modified technique.    Baseline unable    Time 12    Period Weeks    Status New    Target Date 03/09/21      OT LONG TERM GOAL #8   Title I with updated HEP.    Baseline 12/13/2020:  Continual changes to HEP 09/12/2020: Pt. conitinues to require assist for her HEP. Pt. requires assist, and cuing for HEP. 08/08/20: reports completing with husband assist for technique    Time 12    Period Weeks    Status Partially Met    Target Date 03/09/21      OT LONG TERM GOAL  #9   TITLE Pt will demonstrate the ability to don and doff robe after  shower with modified independence    Baseline unable    Time 12    Period Weeks    Status New    Target Date 03/09/21      OT LONG TERM GOAL  #10   TITLE Pt will demonstrate the ability to perform zippers with modified independence and use of adaptive equipment as needed.    Baseline difficulty    Time 12    Period Weeks    Status New    Target Date 03/09/21                   Plan - 01/04/21 1810     Clinical Impression Statement Pt continuing to demonstrate limitations in active ROM and strength in right UE, she is engaging in daily home exercises with the assistance of her husband.  Reaching patterns improved with placing and moving items from lap to tabletop.  Decreased isolated finger movements for manipulation skills.  Continue towards goals to improve right UE functional use for daily tasks.    OT Occupational Profile and History Detailed Assessment- Review of Records and additional review of physical, cognitive, psychosocial history related to current functional performance    Occupational performance deficits (Please refer to evaluation  for details): ADL's;IADL's;Rest and Sleep;Work;Play;Leisure    Body Structure / Function / Physical Skills ADL;Balance;Coordination;Decreased knowledge of precautions;Decreased knowledge of use of DME;Dexterity;Edema;Mobility;Tone;Strength;IADL;Sensation;GMC;Gait;ROM;FMC;Flexibility;Pain;Vision;UE functional use;Endurance    Cognitive Skills Attention;Perception;Problem Solve;Safety Awareness;Thought    Rehab Potential Good    Clinical Decision Making Several treatment options, min-mod task modification necessary    Comorbidities Affecting Occupational Performance: None    Modification or Assistance to Complete Evaluation  Min-Moderate modification of tasks or assist with assess necessary to complete eval    OT Frequency 2x / week    OT Duration 12 weeks    OT Treatment/Interventions Self-care/ADL training;Ultrasound;Energy  conservation;Visual/perceptual remediation/compensation;Patient/family education;DME and/or AE instruction;Aquatic Therapy;Paraffin;Gait Training;Passive range of motion;Balance training;Fluidtherapy;Electrical Stimulation;Functional Mobility Training;Splinting;Moist Heat;Therapeutic exercise;Manual Therapy;Cognitive remediation/compensation;Manual lymph drainage;Neuromuscular education;Coping strategies training    Plan Pt is transferring her care to [facility] OP as it is closer to home. She remains very fearful of moving RUE and responds well to heat, and estim.    Consulted and Agree with Plan of Care Patient             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, unspecified laterality (HCC)  Acute pain of right shoulder  Pain in right hand    Problem List Patient Active Problem List   Diagnosis Date Noted   Dyspareunia in female 04/05/2020   History of abnormal cervical Pap smear 03/10/2020   GIB (gastrointestinal bleeding) 02/28/2020   Elevated BUN    Prediabetes    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   Cerebral vasospasm    Sinus tachycardia    Dysphagia, post-stroke    Thrombocytopenia (HCC)    Acute blood loss anemia    Brain aneurysm    ICH (intracerebral hemorrhage) ([place]) 01/05/2020   History of adenomatous polyp of colon 02/07/2016   Anatomical narrow angle, bilateral 12/14/2014    Fissure in ano 11/11/2014   Hemorrhoids 11/11/2014   Constipation 11/19/2013   GERD (gastroesophageal reflux disease) 03/24/2013   Irritable bowel syndrome with diarrhea 03/24/2013   Herpes zoster 05/14/2005   Abnormal Pap smear of cervix 05/15/1983   History of cervical dysplasia 05/15/1983   Renay Crammer T Eduar Kumpf, OTR/L, CLT  Florinda Taflinger 01/05/2021, 6:20 PM  [place] [address] [place], [place], [address] Phone: [phone]   Fax:  [phone]  Name: Erika Stanton MRN: [id number] Date of Birth: March 19, 1956

## 2021-01-05 ENCOUNTER — Other Ambulatory Visit: Payer: Self-pay

## 2021-01-05 ENCOUNTER — Ambulatory Visit: Payer: BC Managed Care – PPO | Admitting: Occupational Therapy

## 2021-01-05 DIAGNOSIS — M6281 Muscle weakness (generalized): Secondary | ICD-10-CM | POA: Diagnosis not present

## 2021-01-05 DIAGNOSIS — I69159 Hemiplegia and hemiparesis following nontraumatic intracerebral hemorrhage affecting unspecified side: Secondary | ICD-10-CM

## 2021-01-05 DIAGNOSIS — R278 Other lack of coordination: Secondary | ICD-10-CM

## 2021-01-06 ENCOUNTER — Encounter: Payer: Self-pay | Admitting: Physical Medicine and Rehabilitation

## 2021-01-06 ENCOUNTER — Other Ambulatory Visit: Payer: Self-pay

## 2021-01-06 ENCOUNTER — Encounter: Payer: Self-pay | Admitting: Occupational Therapy

## 2021-01-06 ENCOUNTER — Encounter
Payer: BC Managed Care – PPO | Attending: Physical Medicine and Rehabilitation | Admitting: Physical Medicine and Rehabilitation

## 2021-01-06 VITALS — BP 130/84 | HR 82 | Temp 97.9°F | Ht 67.0 in | Wt 192.0 lb

## 2021-01-06 DIAGNOSIS — R252 Cramp and spasm: Secondary | ICD-10-CM | POA: Insufficient documentation

## 2021-01-06 DIAGNOSIS — I69398 Other sequelae of cerebral infarction: Secondary | ICD-10-CM | POA: Diagnosis not present

## 2021-01-06 NOTE — Therapy (Signed)
[place] MAIN [facility] SERVICES [address] [place], [place], [address] Phone: [phone]   Fax:  [phone]  Occupational Therapy Treatment  Patient Details  Name: COTI BURD MRN: [id number] Date of Birth: 07-Oct-1955 Referring Provider (OT): Dr. Naaman Plummer   Encounter Date: 01/05/2021   OT End of Session - 01/06/21 1040     Visit Number [id number]    Number of Visits 104    Date for OT Re-Evaluation 03/09/21    Authorization Type Progress report period starting 09/21/2020    Authorization Time [place]    OT Start Time 1300    OT Stop Time 1346    OT Time Calculation (min) 46 min    Activity Tolerance Patient tolerated treatment well    Behavior During Therapy [facility] for tasks assessed/performed             Past Medical History:  Diagnosis Date   Aneurysm ([place])    Melena     Past Surgical History:  Procedure Laterality Date   CHOLECYSTECTOMY     COLONOSCOPY  11/2017   at [facility]. no recurrent polyps.  suggest repeat surveillance study 11/2022.     COLONOSCOPY W/ POLYPECTOMY  06/2014   Dr Roxy Manns at [facility].  3 adenomatoous polyps, anal fissure.     ESOPHAGOGASTRODUODENOSCOPY (EGD) WITH PROPOFOL N/A 01/27/2020   Procedure: ESOPHAGOGASTRODUODENOSCOPY (EGD) WITH PROPOFOL;  Surgeon: Mauri Pole, MD;  Location: [facility];  Service: Endoscopy;  Laterality: N/A;   IR 3D INDEPENDENT WKST  01/06/2020   IR ANGIO INTRA EXTRACRAN SEL INTERNAL CAROTID BILAT MOD SED  01/06/2020   IR ANGIO VERTEBRAL SEL VERTEBRAL UNI L MOD SED  01/06/2020   IR ANGIOGRAM FOLLOW UP STUDY  01/06/2020   IR ANGIOGRAM FOLLOW UP STUDY  01/06/2020   IR ANGIOGRAM FOLLOW UP STUDY  01/06/2020   IR ANGIOGRAM FOLLOW UP STUDY  01/06/2020   IR ANGIOGRAM FOLLOW UP STUDY  01/06/2020   IR ANGIOGRAM FOLLOW UP STUDY  01/06/2020   IR ANGIOGRAM FOLLOW UP STUDY  01/06/2020   IR ANGIOGRAM FOLLOW UP STUDY  01/06/2020   IR ANGIOGRAM FOLLOW UP STUDY  01/06/2020   IR ANGIOGRAM FOLLOW UP  STUDY  01/06/2020   IR NEURO EACH ADD'L AFTER BASIC UNI RIGHT (MS)  01/06/2020   IR TRANSCATH/EMBOLIZ  01/06/2020   RADIOLOGY WITH ANESTHESIA N/A 01/06/2020   Procedure: IR WITH ANESTHESIA FOR ANEURYSM;  Surgeon: Consuella Lose, MD;  Location: [facility];  Service: Radiology;  Laterality: N/A;   TUBAL LIGATION      There were no vitals filed for this visit.   Subjective Assessment - 01/06/21 1037     Subjective  Pt reports she will be going for her Dysport injection tomorrow.    Pertinent History ICH 01/06/20, Aneurysm. PMH: OA bilateral knees and hands    Patient Stated Goals Pt would like to be as independent as possible.    Currently in Pain? Yes    Pain Score 2     Pain Location Arm    Pain Orientation Right    Pain Descriptors / Indicators Aching    Pain Type Chronic pain    Pain Onset More than a month ago    Pain Frequency Intermittent             Moist heat to right shoulder, forearm and hand for 5 mins  prior to tx session to decrease pain and increase motion and prepare UE for therapeutic exercises.  Therex:  Following moist heat, Pt seen for PROM to RUE for shoulder flexion, ABD, ADD, ER, elbow flexion/extension, forearm supination/pronation, wrist flexion/extension, digit flexion/extension.  Followed by Sinclair Ship of all movements with facilitation.  Shoulder flexion to 105 with guiding, ABD to 90, supination to 70.  Opposition of thumb to index and towards middle finger.    Grasp and release of resistive pinch pins, red and yellow level of resistance, to place and remove from side of bucket.  Cues for pinch patterns and occasional guiding as needed.    Response to tx:   Pt continues to demonstrate increased spasticity in right hand especially on the ulnar side of the hand with ring and small finger, planning to get Dysport injection on Friday and will need therapy in conjunction to injection to work on normalizing movement patterns.  Able to participate in yellow and red level  resistive pinch pins with cues and increased effort to open thumb to place onto pin.  Continue to work towards goals in plan of care, will work towards completing list of HEP next week.                       OT Education - 01/06/21 1039     Education Details HEP for ROM, use of right hand for functional tasks.    Person(s) Educated Patient    Methods Explanation;Verbal cues;Demonstration    Comprehension Verbalized understanding              OT Short Term Goals - 10/31/20 1057       OT SHORT TERM GOAL #5   Title Pt will demonstrate improved LUE fine motor coordination for ADLs as evidenced by decreasing 9 hole peg test score to 25 secs or less.    Baseline 10/31/2020: Left FMC: 32 sec. Left FMC 41 sec visa the 9 hole peg test. 08/08/20: 32 seconds via 9 hole peg    Time 6    Period Weeks    Status On-going    Target Date 12/05/20               OT Long Term Goals - 12/14/20 1024       OT LONG TERM GOAL #1   Title Pt will perform all basic ADLs with supervision    Baseline 12/13/2020 unchanged 6/202/2022: Pt. is able to donn stretchy shirts, donn slide on shoes, donning pants. Pt. has difficylty with managing a bra,shirt buttons, and pant zipper/button.Pt. requires minA from her husband following multiple recent surgeries. Pt. requires minA from her husband. 08/08/20: Pt continues to require MIN A for grooming and dressing tasks.    Time 12    Period Weeks    Status On-going    Target Date 03/09/21      OT LONG TERM GOAL #2   Title Pt will demonstrate 60* RUE shoulder flexion in prep for functional reach with pain less than or equal to 3/10.    Baseline 12/13/20:  shoulder flexion right 64 degrees 10/31/20: Pt. continues to present with limited AROM for isolated  right shoulder flexion with pain. 08/08/20: R AROM Shoulder flexion - 45* c 3/10 pain.    Time 12    Period Weeks    Status On-going    Target Date 03/09/21      OT LONG TERM GOAL #3   Title Pt  will use RUE as a stabilizer/ gross A at least 30 % of the time for ADLs/ IADLs.  Baseline Pt. is using her right hand as a gross stabilizer 15% of the time. 08/08/20: requires MAX cues    Time 12    Period Weeks    Status Achieved      OT LONG TERM GOAL #4   Title Pt will increase LUE grip strength to 25 lbs or greater for increased ease with ADLs.    Baseline 12/13/20: 24# left, 1# right 10/31/2020: Pt. continues to present with limited left grip. Pt. has improved with left grip strength. Pt.continues to work towards 25#. 08/08/20: L grip 19#    Time 12    Period Weeks    Status On-going    Target Date 03/09/21      OT LONG TERM GOAL #5   Title 10/31/2020: Pt. is now perfroming laundry, loading the dishwasher, assists some with cooking, light meal preparation, setting the table    Baseline 12/13/2020:  continues to assist as needed but uses left hand .  10/13/20: Pt. assists with filling the dishwasher, and performing laundry. Pt. continues to try to help when she can, however her husband mostly performs these tasks. 08/08/20: Husband performs    Time 12    Period Weeks    Status On-going    Target Date 03/09/21      OT LONG TERM GOAL #6   Title Pt will demonstrate ability to grasp/ release a cup 2/3 trials with RUE.    Baseline 12/13/20:  Continues to work on consistency with grasp and release 10/31/2020: Pt. continues to initiate grasp, and active release of objects, however is unable to grasp/release a cup. 08/08/20: Able to grasp, unable to release x1 trial    Time 12    Period Weeks    Status On-going    Target Date 03/09/21      OT LONG TERM GOAL #7   Title Pt will demonstrate the ability to place clothing on hangers with use of bilateral UE and modified technique.    Baseline unable    Time 12    Period Weeks    Status New    Target Date 03/09/21      OT LONG TERM GOAL #8   Title I with updated HEP.    Baseline 12/13/2020:  Continual changes to HEP 09/12/2020: Pt. conitinues to  require assist for her HEP. Pt. requires assist, and cuing for HEP. 08/08/20: reports completing with husband assist for technique    Time 12    Period Weeks    Status Partially Met    Target Date 03/09/21      OT LONG TERM GOAL  #9   TITLE Pt will demonstrate the ability to don and doff robe after shower with modified independence    Baseline unable    Time 12    Period Weeks    Status New    Target Date 03/09/21      OT LONG TERM GOAL  #10   TITLE Pt will demonstrate the ability to perform zippers with modified independence and use of adaptive equipment as needed.    Baseline difficulty    Time 12    Period Weeks    Status New    Target Date 03/09/21                   Plan - 01/06/21 1040     Clinical Impression Statement Pt continues to demonstrate increased spasticity in right hand especially on the ulnar side of the hand with ring and small  finger, planning to get Dysport injection on Friday and will need therapy in conjunction to injection to work on normalizing movement patterns.  Able to participate in yellow and red level resistive pinch pins with cues and increased effort to open thumb to place onto pin.  Continue to work towards goals in plan of care, will work towards completing list of HEP next week.    OT Occupational Profile and History Detailed Assessment- Review of Records and additional review of physical, cognitive, psychosocial history related to current functional performance    Occupational performance deficits (Please refer to evaluation for details): ADL's;IADL's;Rest and Sleep;Work;Play;Leisure    Body Structure / Function / Physical Skills ADL;Balance;Coordination;Decreased knowledge of precautions;Decreased knowledge of use of DME;Dexterity;Edema;Mobility;Tone;Strength;IADL;Sensation;GMC;Gait;ROM;FMC;Flexibility;Pain;Vision;UE functional use;Endurance    Cognitive Skills Attention;Perception;Problem Solve;Safety Awareness;Thought    Rehab Potential  Good    Clinical Decision Making Several treatment options, min-mod task modification necessary    Comorbidities Affecting Occupational Performance: None    Modification or Assistance to Complete Evaluation  Min-Moderate modification of tasks or assist with assess necessary to complete eval    OT Frequency 2x / week    OT Duration 12 weeks    OT Treatment/Interventions Self-care/ADL training;Ultrasound;Energy conservation;Visual/perceptual remediation/compensation;Patient/family education;DME and/or AE instruction;Aquatic Therapy;Paraffin;Gait Training;Passive range of motion;Balance training;Fluidtherapy;Electrical Stimulation;Functional Mobility Training;Splinting;Moist Heat;Therapeutic exercise;Manual Therapy;Cognitive remediation/compensation;Manual lymph drainage;Neuromuscular education;Coping strategies training    Plan Pt is transferring her care to [facility] OP as it is closer to home. She remains very fearful of moving RUE and responds well to heat, and estim.    Consulted and Agree with Plan of Care Patient             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, unspecified laterality (HCC)    Problem List Patient Active Problem List   Diagnosis Date Noted   Dyspareunia in female 04/05/2020   History of abnormal cervical Pap smear 03/10/2020   GIB (gastrointestinal bleeding) 02/28/2020   Elevated  BUN    Prediabetes    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   Cerebral vasospasm    Sinus tachycardia    Dysphagia, post-stroke    Thrombocytopenia (HCC)    Acute blood loss anemia    Brain aneurysm    ICH (intracerebral hemorrhage) ([place]) 01/05/2020   History of adenomatous polyp of colon 02/07/2016   Anatomical narrow angle, bilateral 12/14/2014   Fissure in ano 11/11/2014   Hemorrhoids 11/11/2014   Constipation 11/19/2013   GERD (gastroesophageal reflux disease) 03/24/2013   Irritable bowel syndrome with diarrhea 03/24/2013   Herpes zoster 05/14/2005   Abnormal Pap smear of cervix 05/15/1983   History of cervical dysplasia 05/15/1983   Curley Hogen T Imanuel Pruiett, OTR/L, CLT  Odell Fasching 01/07/2021, 5:23 PM  [facility] [place] [address] [place], [place], [address] Phone: [phone]   Fax:  [phone]  Name: SHAQUASIA CAPONIGRO MRN: [id number] Date of Birth: 1955-06-09

## 2021-01-06 NOTE — Progress Notes (Signed)
Dysport 300U Injection for spasticity using needle US guidance  Indication: Severe spasticity which interferes with ADL,mobility and/or  hygiene and is unresponsive to medication management and other conservative care Informed consent was obtained after describing risks and benefits of the procedure with the patient. This includes bleeding, bruising, infection, excessive weakness, or medication side effects. A REMS form is on file and signed. Number of units per muscle:  FCR 100U FCU 100U FDP 100U All injections were done after obtaining appropriate [place] visualization and after negative drawback for blood. The patient tolerated the procedure well. Post procedure instructions were given. A followup appointment was made.

## 2021-01-09 ENCOUNTER — Ambulatory Visit: Payer: BC Managed Care – PPO | Admitting: Occupational Therapy

## 2021-01-10 ENCOUNTER — Ambulatory Visit: Payer: BC Managed Care – PPO | Admitting: Occupational Therapy

## 2021-01-10 ENCOUNTER — Other Ambulatory Visit: Payer: Self-pay

## 2021-01-10 DIAGNOSIS — R278 Other lack of coordination: Secondary | ICD-10-CM

## 2021-01-10 DIAGNOSIS — M6281 Muscle weakness (generalized): Secondary | ICD-10-CM | POA: Diagnosis not present

## 2021-01-10 DIAGNOSIS — M79641 Pain in right hand: Secondary | ICD-10-CM

## 2021-01-10 DIAGNOSIS — M25511 Pain in right shoulder: Secondary | ICD-10-CM

## 2021-01-10 DIAGNOSIS — I69159 Hemiplegia and hemiparesis following nontraumatic intracerebral hemorrhage affecting unspecified side: Secondary | ICD-10-CM

## 2021-01-11 NOTE — Therapy (Signed)
[facility] SERVICES [address] [place], [place], [address] Phone: [phone]   Fax:  [phone]  Occupational Therapy Treatment  Patient Details  Name: Erika Stanton MRN: [id number] Date of Birth: Oct 04, 1955 Referring Provider (OT): Dr. Naaman Plummer   Encounter Date: 01/10/2021   OT End of Session - 01/11/21 1544     Visit Number 19    Number of Visits 104    Date for OT Re-Evaluation 03/09/21    Authorization Type Progress report period starting 09/21/2020    Authorization Time Period State [employer]    OT Start Time 1301    OT Stop Time 1347    OT Time Calculation (min) 46 min    Activity Tolerance Patient tolerated treatment well    Behavior During Therapy [facility] for tasks assessed/performed             Past Medical History:  Diagnosis Date   Aneurysm ([place])    Melena     Past Surgical History:  Procedure Laterality Date   CHOLECYSTECTOMY     COLONOSCOPY  11/2017   at [facility]. no recurrent polyps.  suggest repeat surveillance study 11/2022.     COLONOSCOPY W/ POLYPECTOMY  06/2014   Dr Roxy Manns at [facility].  3 adenomatoous polyps, anal fissure.     ESOPHAGOGASTRODUODENOSCOPY (EGD) WITH PROPOFOL N/A 01/27/2020   Procedure: ESOPHAGOGASTRODUODENOSCOPY (EGD) WITH PROPOFOL;  Surgeon: Mauri Pole, MD;  Location: [facility];  Service: Endoscopy;  Laterality: N/A;   IR 3D INDEPENDENT WKST  01/06/2020   IR ANGIO INTRA EXTRACRAN SEL INTERNAL CAROTID BILAT MOD SED  01/06/2020   IR ANGIO VERTEBRAL SEL VERTEBRAL UNI L MOD SED  01/06/2020   IR ANGIOGRAM FOLLOW UP STUDY  01/06/2020   IR ANGIOGRAM FOLLOW UP STUDY  01/06/2020   IR ANGIOGRAM FOLLOW UP STUDY  01/06/2020   IR ANGIOGRAM FOLLOW UP STUDY  01/06/2020   IR ANGIOGRAM FOLLOW UP STUDY  01/06/2020   IR ANGIOGRAM FOLLOW UP STUDY  01/06/2020   IR ANGIOGRAM FOLLOW UP STUDY  01/06/2020   IR ANGIOGRAM FOLLOW UP STUDY  01/06/2020   IR ANGIOGRAM FOLLOW UP STUDY  01/06/2020   IR ANGIOGRAM FOLLOW UP  STUDY  01/06/2020   IR NEURO EACH ADD'L AFTER BASIC UNI RIGHT (MS)  01/06/2020   IR TRANSCATH/EMBOLIZ  01/06/2020   RADIOLOGY WITH ANESTHESIA N/A 01/06/2020   Procedure: IR WITH ANESTHESIA FOR ANEURYSM;  Surgeon: Consuella Lose, MD;  Location: [facility];  Service: Radiology;  Laterality: N/A;   TUBAL LIGATION      There were no vitals filed for this visit.   Subjective Assessment - 01/11/21 1537     Subjective  Pt reports she got her Dysport injection on Friday and reports it hurt at the time.    Pertinent History ICH 01/06/20, Aneurysm. PMH: OA bilateral knees and hands    Patient Stated Goals Pt would like to be as independent as possible.    Currently in Pain? Yes    Pain Score 2     Pain Location Arm    Pain Orientation Right    Pain Descriptors / Indicators Aching    Pain Type Chronic pain    Pain Onset More than a month ago    Multiple Pain Sites No             Moist heat to right shoulder, forearm and hand for 5 mins  prior to tx session to decrease pain and increase motion and prepare  UE for therapeutic exercises.   Therex:  Pt seen for PROM to RUE for shoulder flexion, ABD, ADD, ER, elbow flexion/extension, forearm supination/pronation, wrist flexion/extension, digit flexion/extension.  Followed by Sinclair Ship of all movements with facilitation with cues for proper form and technique, guiding required at times to decrease substitution of movement patterns.  Active reaching tasks performed from lap to tabletop and then combined with grasping of items.  Assist with right hand to facilitate extension of digits especially with ring and small finger.   Response to tx:   Pt with recent Dysport injections (similar to botox) to help with managing spasticity in RUE and hand.  Pt inquiring if hand is looser this date, mild changes noted but discussed it often takes a week or more to start seeing more definitive changes.  She will continue to benefit from therapy in conjunction to injections.   Will plan to issue comprehensive HEP next session.  Continue to work towards goals in plan of care to improve right UE for use in ADL and IADL tasks with greater independence and facilitation of more normal movement patterns.                         OT Education - 01/11/21 1543     Education Details ROM, strength and coordination tasks    Person(s) Educated Patient    Methods Explanation;Verbal cues;Demonstration    Comprehension Verbalized understanding;Returned demonstration              OT Short Term Goals - 10/31/20 1057       OT SHORT TERM GOAL #5   Title Pt will demonstrate improved LUE fine motor coordination for ADLs as evidenced by decreasing 9 hole peg test score to 25 secs or less.    Baseline 10/31/2020: Left FMC: 32 sec. Left FMC 41 sec visa the 9 hole peg test. 08/08/20: 32 seconds via 9 hole peg    Time 6    Period Weeks    Status On-going    Target Date 12/05/20               OT Long Term Goals - 12/14/20 1024       OT LONG TERM GOAL #1   Title Pt will perform all basic ADLs with supervision    Baseline 12/13/2020 unchanged 6/202/2022: Pt. is able to donn stretchy shirts, donn slide on shoes, donning pants. Pt. has difficylty with managing a bra,shirt buttons, and pant zipper/button.Pt. requires minA from her husband following multiple recent surgeries. Pt. requires minA from her husband. 08/08/20: Pt continues to require MIN A for grooming and dressing tasks.    Time 12    Period Weeks    Status On-going    Target Date 03/09/21      OT LONG TERM GOAL #2   Title Pt will demonstrate 60* RUE shoulder flexion in prep for functional reach with pain less than or equal to 3/10.    Baseline 12/13/20:  shoulder flexion right 64 degrees 10/31/20: Pt. continues to present with limited AROM for isolated  right shoulder flexion with pain. 08/08/20: R AROM Shoulder flexion - 45* c 3/10 pain.    Time 12    Period Weeks    Status On-going    Target Date  03/09/21      OT LONG TERM GOAL #3   Title Pt will use RUE as a stabilizer/ gross A at least 30 % of the time for ADLs/ IADLs.  Baseline Pt. is using her right hand as a gross stabilizer 15% of the time. 08/08/20: requires MAX cues    Time 12    Period Weeks    Status Achieved      OT LONG TERM GOAL #4   Title Pt will increase LUE grip strength to 25 lbs or greater for increased ease with ADLs.    Baseline 12/13/20: 24# left, 1# right 10/31/2020: Pt. continues to present with limited left grip. Pt. has improved with left grip strength. Pt.continues to work towards 25#. 08/08/20: L grip 19#    Time 12    Period Weeks    Status On-going    Target Date 03/09/21      OT LONG TERM GOAL #5   Title 10/31/2020: Pt. is now perfroming laundry, loading the dishwasher, assists some with cooking, light meal preparation, setting the table    Baseline 12/13/2020:  continues to assist as needed but uses left hand .  10/13/20: Pt. assists with filling the dishwasher, and performing laundry. Pt. continues to try to help when she can, however her husband mostly performs these tasks. 08/08/20: Husband performs    Time 12    Period Weeks    Status On-going    Target Date 03/09/21      OT LONG TERM GOAL #6   Title Pt will demonstrate ability to grasp/ release a cup 2/3 trials with RUE.    Baseline 12/13/20:  Continues to work on consistency with grasp and release 10/31/2020: Pt. continues to initiate grasp, and active release of objects, however is unable to grasp/release a cup. 08/08/20: Able to grasp, unable to release x1 trial    Time 12    Period Weeks    Status On-going    Target Date 03/09/21      OT LONG TERM GOAL #7   Title Pt will demonstrate the ability to place clothing on hangers with use of bilateral UE and modified technique.    Baseline unable    Time 12    Period Weeks    Status New    Target Date 03/09/21      OT LONG TERM GOAL #8   Title I with updated HEP.    Baseline 12/13/2020:  Continual  changes to HEP 09/12/2020: Pt. conitinues to require assist for her HEP. Pt. requires assist, and cuing for HEP. 08/08/20: reports completing with husband assist for technique    Time 12    Period Weeks    Status Partially Met    Target Date 03/09/21      OT LONG TERM GOAL  #9   TITLE Pt will demonstrate the ability to don and doff robe after shower with modified independence    Baseline unable    Time 12    Period Weeks    Status New    Target Date 03/09/21      OT LONG TERM GOAL  #10   TITLE Pt will demonstrate the ability to perform zippers with modified independence and use of adaptive equipment as needed.    Baseline difficulty    Time 12    Period Weeks    Status New    Target Date 03/09/21                   Plan - 01/11/21 1544     Clinical Impression Statement Pt with recent Dysport injections (similar to botox) to help with managing spasticity in RUE and hand.  Pt inquiring if  hand is looser this date, mild changes noted but discussed it often takes a week or more to start seeing more definitive changes.  She will continue to benefit from therapy in conjunction to injections.  Will plan to issue comprehensive HEP next session.  Continue to work towards goals in plan of care to improve right UE for use in ADL and IADL tasks with greater independence and facilitation of more normal movement patterns.    OT Occupational Profile and History Detailed Assessment- Review of Records and additional review of physical, cognitive, psychosocial history related to current functional performance    Occupational performance deficits (Please refer to evaluation for details): ADL's;IADL's;Rest and Sleep;Work;Play;Leisure    Body Structure / Function / Physical Skills ADL;Balance;Coordination;Decreased knowledge of precautions;Decreased knowledge of use of DME;Dexterity;Edema;Mobility;Tone;Strength;IADL;Sensation;GMC;Gait;ROM;FMC;Flexibility;Pain;Vision;UE functional use;Endurance     Cognitive Skills Attention;Perception;Problem Solve;Safety Awareness;Thought    Rehab Potential Good    Clinical Decision Making Several treatment options, min-mod task modification necessary    Comorbidities Affecting Occupational Performance: None    Modification or Assistance to Complete Evaluation  Min-Moderate modification of tasks or assist with assess necessary to complete eval    OT Frequency 2x / week    OT Duration 12 weeks    OT Treatment/Interventions Self-care/ADL training;Ultrasound;Energy conservation;Visual/perceptual remediation/compensation;Patient/family education;DME and/or AE instruction;Aquatic Therapy;Paraffin;Gait Training;Passive range of motion;Balance training;Fluidtherapy;Electrical Stimulation;Functional Mobility Training;Splinting;Moist Heat;Therapeutic exercise;Manual Therapy;Cognitive remediation/compensation;Manual lymph drainage;Neuromuscular education;Coping strategies training    Plan Pt is transferring her care to [facility] as it is closer to home. She remains very fearful of moving RUE and responds well to heat, and estim.    Consulted and Agree with Plan of Care Patient             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, unspecified laterality (HCC)  Acute pain of right shoulder  Pain in right hand    Problem List Patient Active Problem List   Diagnosis Date  Noted   Dyspareunia in female 04/05/2020   History of abnormal cervical Pap smear 03/10/2020   GIB (gastrointestinal bleeding) 02/28/2020   Elevated BUN    Prediabetes    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   Cerebral vasospasm    Sinus tachycardia    Dysphagia, post-stroke    Thrombocytopenia (HCC)    Acute blood loss anemia    Brain aneurysm    ICH (intracerebral hemorrhage) ([place]) 01/05/2020   History of adenomatous polyp of colon 02/07/2016   Anatomical narrow angle, 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

## 2021-01-12 ENCOUNTER — Ambulatory Visit: Payer: BC Managed Care – PPO | Attending: Physical Medicine & Rehabilitation | Admitting: Occupational Therapy

## 2021-01-12 ENCOUNTER — Encounter: Payer: Self-pay | Admitting: Occupational Therapy

## 2021-01-12 ENCOUNTER — Other Ambulatory Visit: Payer: Self-pay

## 2021-01-12 DIAGNOSIS — M79641 Pain in right hand: Secondary | ICD-10-CM | POA: Insufficient documentation

## 2021-01-12 DIAGNOSIS — I607 Nontraumatic subarachnoid hemorrhage from unspecified intracranial artery: Secondary | ICD-10-CM | POA: Diagnosis present

## 2021-01-12 DIAGNOSIS — M6281 Muscle weakness (generalized): Secondary | ICD-10-CM | POA: Diagnosis present

## 2021-01-12 DIAGNOSIS — R278 Other lack of coordination: Secondary | ICD-10-CM | POA: Insufficient documentation

## 2021-01-12 DIAGNOSIS — M25511 Pain in right shoulder: Secondary | ICD-10-CM | POA: Diagnosis present

## 2021-01-12 DIAGNOSIS — I69159 Hemiplegia and hemiparesis following nontraumatic intracerebral hemorrhage affecting unspecified side: Secondary | ICD-10-CM | POA: Diagnosis present

## 2021-01-12 NOTE — Therapy (Signed)
[place] MAIN [facility] SERVICES [address] [place], [place], [address] Phone: [phone]   Fax:  [phone]  Occupational Therapy Treatment  Patient Details  Name: Erika Stanton MRN: [id number] Date of Birth: Aug 23, 1955 Referring Provider (OT): Dr. Naaman Plummer   Encounter Date: 01/12/2021   OT End of Session - 01/12/21 1447     Visit Number 47    Number of Visits 104    Date for OT Re-Evaluation 03/09/21    Authorization Type Progress report period starting 09/21/2020    Authorization Time Period State [employer]    OT Start Time 1300    OT Stop Time 1344    OT Time Calculation (min) 44 min    Activity Tolerance Patient tolerated treatment well    Behavior During Therapy [facility] for tasks assessed/performed             Past Medical History:  Diagnosis Date   Aneurysm ([place])    Melena     Past Surgical History:  Procedure Laterality Date   CHOLECYSTECTOMY     COLONOSCOPY  11/2017   at [facility]. no recurrent polyps.  suggest repeat surveillance study 11/2022.     COLONOSCOPY W/ POLYPECTOMY  06/2014   Dr Roxy Manns at [facility].  3 adenomatoous polyps, anal fissure.     ESOPHAGOGASTRODUODENOSCOPY (EGD) WITH PROPOFOL N/A 01/27/2020   Procedure: ESOPHAGOGASTRODUODENOSCOPY (EGD) WITH PROPOFOL;  Surgeon: Mauri Pole, MD;  Location: [place] [facility];  Service: Endoscopy;  Laterality: N/A;   IR 3D INDEPENDENT WKST  01/06/2020   IR ANGIO INTRA EXTRACRAN SEL INTERNAL CAROTID BILAT MOD SED  01/06/2020   IR ANGIO VERTEBRAL SEL VERTEBRAL UNI L MOD SED  01/06/2020   IR ANGIOGRAM FOLLOW UP STUDY  01/06/2020   IR ANGIOGRAM FOLLOW UP STUDY  01/06/2020   IR ANGIOGRAM FOLLOW UP STUDY  01/06/2020   IR ANGIOGRAM FOLLOW UP STUDY  01/06/2020   IR ANGIOGRAM FOLLOW UP STUDY  01/06/2020   IR ANGIOGRAM FOLLOW UP STUDY  01/06/2020   IR ANGIOGRAM FOLLOW UP STUDY  01/06/2020   IR ANGIOGRAM FOLLOW UP STUDY  01/06/2020   IR ANGIOGRAM FOLLOW UP STUDY  01/06/2020   IR ANGIOGRAM FOLLOW UP  STUDY  01/06/2020   IR NEURO EACH ADD'L AFTER BASIC UNI RIGHT (MS)  01/06/2020   IR TRANSCATH/EMBOLIZ  01/06/2020   RADIOLOGY WITH ANESTHESIA N/A 01/06/2020   Procedure: IR WITH ANESTHESIA FOR ANEURYSM;  Surgeon: Consuella Lose, MD;  Location: [place];  Service: Radiology;  Laterality: N/A;   TUBAL LIGATION      There were no vitals filed for this visit.   Subjective Assessment - 01/12/21 1446     Subjective  Pt reports she will be going to the beach next week with her husband for a vacation    Pertinent History ICH 01/06/20, Aneurysm. PMH: OA bilateral knees and hands    Patient Stated Goals Pt would like to be as independent as possible.    Currently in Pain? Yes    Pain Score 2     Pain Location Arm    Pain Orientation Right    Pain Descriptors / Indicators Aching    Pain Type Chronic pain    Pain Onset More than a month ago    Pain Frequency Intermittent            Moist heat to right shoulder and right forearm for 5 mins prior to therapeutic exercise.    Therapeutic Exercises: AAROM in sitting for shoulder  flexion, ABD, ER, elbow flexion/ext, supination/pronation. Gross grasp and release in sitting [place] in vertical position, pushing forward flexion, ABD and small circles forwards and back, cues and guiding from therapist for control.   Playing cards for flipping with emphasis on supination to place cards into stack.         Response to tx:   Pt continues to progress with right UE, Pt with mild pain at right shoulder with flexion greater than 90 degrees. Spasticity still present in right UE which limits reaching at times. Difficulty with supination/pronation with flipping of cards.  Continue to work towards goals in plan of care to increase independence in daily tasks.                       OT Education - 01/12/21 1446     Education Details ROM, strength and coordination tasks    Person(s) Educated Patient    Methods Explanation;Verbal  cues;Demonstration    Comprehension Verbalized understanding;Returned demonstration              OT Short Term Goals - 10/31/20 1057       OT SHORT TERM GOAL #5   Title Pt will demonstrate improved LUE fine motor coordination for ADLs as evidenced by decreasing 9 hole peg test score to 25 secs or less.    Baseline 10/31/2020: Left FMC: 32 sec. Left FMC 41 sec visa the 9 hole peg test. 08/08/20: 32 seconds via 9 hole peg    Time 6    Period Weeks    Status On-going    Target Date 12/05/20               OT Long Term Goals - 12/14/20 1024       OT LONG TERM GOAL #1   Title Pt will perform all basic ADLs with supervision    Baseline 12/13/2020 unchanged 6/202/2022: Pt. is able to donn stretchy shirts, donn slide on shoes, donning pants. Pt. has difficylty with managing a bra,shirt buttons, and pant zipper/button.Pt. requires minA from her husband following multiple recent surgeries. Pt. requires minA from her husband. 08/08/20: Pt continues to require MIN A for grooming and dressing tasks.    Time 12    Period Weeks    Status On-going    Target Date 03/09/21      OT LONG TERM GOAL #2   Title Pt will demonstrate 60* RUE shoulder flexion in prep for functional reach with pain less than or equal to 3/10.    Baseline 12/13/20:  shoulder flexion right 64 degrees 10/31/20: Pt. continues to present with limited AROM for isolated  right shoulder flexion with pain. 08/08/20: R AROM Shoulder flexion - 45* c 3/10 pain.    Time 12    Period Weeks    Status On-going    Target Date 03/09/21      OT LONG TERM GOAL #3   Title Pt will use RUE as a stabilizer/ gross A at least 30 % of the time for ADLs/ IADLs.    Baseline Pt. is using her right hand as a gross stabilizer 15% of the time. 08/08/20: requires MAX cues    Time 12    Period Weeks    Status Achieved      OT LONG TERM GOAL #4   Title Pt will increase LUE grip strength to 25 lbs or greater for increased ease with ADLs.    Baseline  12/13/20: 24# left, 1# right 10/31/2020:  Pt. continues to present with limited left grip. Pt. has improved with left grip strength. Pt.continues to work towards 25#. 08/08/20: L grip 19#    Time 12    Period Weeks    Status On-going    Target Date 03/09/21      OT LONG TERM GOAL #5   Title 10/31/2020: Pt. is now perfroming laundry, loading the dishwasher, assists some with cooking, light meal preparation, setting the table    Baseline 12/13/2020:  continues to assist as needed but uses left hand .  10/13/20: Pt. assists with filling the dishwasher, and performing laundry. Pt. continues to try to help when she can, however her husband mostly performs these tasks. 08/08/20: Husband performs    Time 12    Period Weeks    Status On-going    Target Date 03/09/21      OT LONG TERM GOAL #6   Title Pt will demonstrate ability to grasp/ release a cup 2/3 trials with RUE.    Baseline 12/13/20:  Continues to work on consistency with grasp and release 10/31/2020: Pt. continues to initiate grasp, and active release of objects, however is unable to grasp/release a cup. 08/08/20: Able to grasp, unable to release x1 trial    Time 12    Period Weeks    Status On-going    Target Date 03/09/21      OT LONG TERM GOAL #7   Title Pt will demonstrate the ability to place clothing on hangers with use of bilateral UE and modified technique.    Baseline unable    Time 12    Period Weeks    Status New    Target Date 03/09/21      OT LONG TERM GOAL #8   Title I with updated HEP.    Baseline 12/13/2020:  Continual changes to HEP 09/12/2020: Pt. conitinues to require assist for her HEP. Pt. requires assist, and cuing for HEP. 08/08/20: reports completing with husband assist for technique    Time 12    Period Weeks    Status Partially Met    Target Date 03/09/21      OT LONG TERM GOAL  #9   TITLE Pt will demonstrate the ability to don and doff robe after shower with modified independence    Baseline unable    Time 12     Period Weeks    Status New    Target Date 03/09/21      OT LONG TERM GOAL  #10   TITLE Pt will demonstrate the ability to perform zippers with modified independence and use of adaptive equipment as needed.    Baseline difficulty    Time 12    Period Weeks    Status New    Target Date 03/09/21                   Plan - 01/12/21 1447     Clinical Impression Statement Pt continues to progress with right UE, Pt with mild pain at right shoulder with flexion greater than 90 degrees. Spasticity still present in right UE which limits reaching at times. Difficulty with supination/pronation with flipping of cards.  Continue to work towards goals in plan of care to increase independence in daily tasks.    OT Occupational Profile and History Detailed Assessment- Review of Records and additional review of physical, cognitive, psychosocial history related to current functional performance    Occupational performance deficits (Please refer to evaluation for details): ADL's;IADL's;Rest and  Sleep;Work;Play;Leisure    Body Structure / Function / Physical Skills ADL;Balance;Coordination;Decreased knowledge of precautions;Decreased knowledge of use of DME;Dexterity;Edema;Mobility;Tone;Strength;IADL;Sensation;GMC;Gait;ROM;FMC;Flexibility;Pain;Vision;UE functional use;Endurance    Cognitive Skills Attention;Perception;Problem Solve;Safety Awareness;Thought    Rehab Potential Good    Clinical Decision Making Several treatment options, min-mod task modification necessary    Comorbidities Affecting Occupational Performance: None    Modification or Assistance to Complete Evaluation  Min-Moderate modification of tasks or assist with assess necessary to complete eval    OT Frequency 2x / week    OT Duration 12 weeks    OT Treatment/Interventions Self-care/ADL training;Ultrasound;Energy conservation;Visual/perceptual remediation/compensation;Patient/family education;DME and/or AE instruction;Aquatic  Therapy;Paraffin;Gait Training;Passive range of motion;Balance training;Fluidtherapy;Electrical Stimulation;Functional Mobility Training;Splinting;Moist Heat;Therapeutic exercise;Manual Therapy;Cognitive remediation/compensation;Manual lymph drainage;Neuromuscular education;Coping strategies training    Plan Pt is transferring her care to [facility] OP as it is closer to home. She remains very fearful of moving RUE and responds well to heat, and estim.    Consulted and Agree with Plan of Care Patient             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, unspecified laterality (HCC)  Acute pain of right shoulder    Problem List Patient Active Problem List   Diagnosis Date Noted   Dyspareunia in female 04/05/2020   History of abnormal cervical Pap smear 03/10/2020   GIB (gastrointestinal bleeding) 02/28/2020   Elevated BUN    Prediabetes    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   Cerebral vasospasm    Sinus tachycardia    Dysphagia, post-stroke    Thrombocytopenia (HCC)    Acute blood loss anemia    Brain aneurysm    ICH (intracerebral hemorrhage) ([place]) 01/05/2020   History of adenomatous polyp of colon 02/07/2016   Anatomical narrow angle, 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

## 2021-01-17 ENCOUNTER — Ambulatory Visit: Payer: BC Managed Care – PPO | Admitting: Occupational Therapy

## 2021-01-19 ENCOUNTER — Ambulatory Visit: Payer: BC Managed Care – PPO | Admitting: Occupational Therapy

## 2021-01-23 ENCOUNTER — Ambulatory Visit: Payer: BC Managed Care – PPO | Admitting: Occupational Therapy

## 2021-01-24 ENCOUNTER — Encounter: Payer: Self-pay | Admitting: Occupational Therapy

## 2021-01-24 ENCOUNTER — Ambulatory Visit: Payer: BC Managed Care – PPO | Admitting: Occupational Therapy

## 2021-01-24 ENCOUNTER — Other Ambulatory Visit: Payer: Self-pay

## 2021-01-24 DIAGNOSIS — M6281 Muscle weakness (generalized): Secondary | ICD-10-CM | POA: Diagnosis not present

## 2021-01-24 DIAGNOSIS — I69159 Hemiplegia and hemiparesis following nontraumatic intracerebral hemorrhage affecting unspecified side: Secondary | ICD-10-CM

## 2021-01-24 DIAGNOSIS — I607 Nontraumatic subarachnoid hemorrhage from unspecified intracranial artery: Secondary | ICD-10-CM

## 2021-01-24 DIAGNOSIS — R278 Other lack of coordination: Secondary | ICD-10-CM

## 2021-01-24 DIAGNOSIS — M25511 Pain in right shoulder: Secondary | ICD-10-CM

## 2021-01-24 DIAGNOSIS — M79641 Pain in right hand: Secondary | ICD-10-CM

## 2021-01-24 NOTE — Therapy (Signed)
[place] MAIN [facility] SERVICES [address] [place], [place], [address] Phone: [phone]   Fax:  [phone]  Occupational Therapy Treatment/Progress Update Reporting period from 12/13/2020 to 01/24/2021  Patient Details  Name: Erika Stanton MRN: [id number] Date of Birth: 09-02-1955 Referring Provider (OT): Dr. Naaman Plummer   Encounter Date: 01/24/2021   OT End of Session - 01/24/21 1515     Visit Number 90    Number of Visits 104    Date for OT Re-Evaluation 03/09/21    Authorization Type Progress report period starting 09/21/2020    Authorization Time [place]    OT Start Time 1100    OT Stop Time 1145    OT Time Calculation (min) 45 min    Activity Tolerance Patient tolerated treatment well    Behavior During Therapy [facility] for tasks assessed/performed             Past Medical History:  Diagnosis Date   Aneurysm ([place])    Melena     Past Surgical History:  Procedure Laterality Date   CHOLECYSTECTOMY     COLONOSCOPY  11/2017   at [facility]. no recurrent polyps.  suggest repeat surveillance study 11/2022.     COLONOSCOPY W/ POLYPECTOMY  06/2014   Dr Roxy Manns at [facility].  3 adenomatoous polyps, anal fissure.     ESOPHAGOGASTRODUODENOSCOPY (EGD) WITH PROPOFOL N/A 01/27/2020   Procedure: ESOPHAGOGASTRODUODENOSCOPY (EGD) WITH PROPOFOL;  Surgeon: Mauri Pole, MD;  Location: [facility];  Service: Endoscopy;  Laterality: N/A;   IR 3D INDEPENDENT WKST  01/06/2020   IR ANGIO INTRA EXTRACRAN SEL INTERNAL CAROTID BILAT MOD SED  01/06/2020   IR ANGIO VERTEBRAL SEL VERTEBRAL UNI L MOD SED  01/06/2020   IR ANGIOGRAM FOLLOW UP STUDY  01/06/2020   IR ANGIOGRAM FOLLOW UP STUDY  01/06/2020   IR ANGIOGRAM FOLLOW UP STUDY  01/06/2020   IR ANGIOGRAM FOLLOW UP STUDY  01/06/2020   IR ANGIOGRAM FOLLOW UP STUDY  01/06/2020   IR ANGIOGRAM FOLLOW UP STUDY  01/06/2020   IR ANGIOGRAM FOLLOW UP STUDY  01/06/2020   IR ANGIOGRAM FOLLOW UP STUDY  01/06/2020   IR  ANGIOGRAM FOLLOW UP STUDY  01/06/2020   IR ANGIOGRAM FOLLOW UP STUDY  01/06/2020   IR NEURO EACH ADD'L AFTER BASIC UNI RIGHT (MS)  01/06/2020   IR TRANSCATH/EMBOLIZ  01/06/2020   RADIOLOGY WITH ANESTHESIA N/A 01/06/2020   Procedure: IR WITH ANESTHESIA FOR ANEURYSM;  Surgeon: Consuella Lose, MD;  Location: [place];  Service: Radiology;  Laterality: N/A;   TUBAL LIGATION      There were no vitals filed for this visit.   Subjective Assessment - 01/24/21 1513     Subjective  Pt reports she [place] the beach with [place], it rained quite a bit but still enjoyed the time away, she has an upcoming surgery for her brain aneursym on Sept 26.    Pertinent History ICH 01/06/20, Aneurysm. PMH: OA bilateral knees and hands    Patient Stated Goals Pt would like to be as independent as possible.    Currently in Pain? Yes    Pain Score 2     Pain Location Arm    Pain Orientation Right    Pain Descriptors / Indicators Aching    Pain Type Chronic pain    Pain Onset More than a month ago    Pain Frequency Intermittent  Pt seen for reassessment of skills, see flowsheet for details.  Updated goals to reflect progress.  Moist heat to right shoulder, forearm, wrist and hand for 5 mins prior to therapeutic exercise.    Pt seen for PROM of RUE in all joints and planes followed by AAROM within available ranges.  Focused on PROM of digits for extension.    Pt attempted 9 hole peg test with right UE but was unable to pick up and manipulate pegs, she can remove from the board once they are placed only.    Gross grasp and release of 1 inch objects this date with multiple reps and trials.  Cues for grasping patterns.    Response to tx: Pt has continued to make progress in right UE with ROM and strength.  She has 1# of grip, 2# of pinch in the right hand.  Pain in shoulder has decreased to 2/10 with movement.  She continues to demonstrate spasticity in right hand especially on the  ulnar side of the hand with ring and small finger but has received recent Dysport injections to help with normalizing spasticity in conjunction with therapy.  Pt remains unable to perform 9 hole peg test, occasionally able to pick up a peg but not able to manipulate to place into the board.  She is able to demonstrate grasp to remove the pegs if placed for her.  She continues to benefit from skilled OT services to maximize safety and independence in necessary daily tasks.        [facility] OT Assessment - 01/24/21 1518       ADL   Eating/Feeding --   left hand only   Grooming --   left hand use   Upper Body Bathing Minimal assistance    Lower Body Bathing Minimal assistance    Upper Body Dressing Increased time   pullover   Lower Body Dressing Modified independent   pants, underwear and slip on shoes, does not wear socks.   Medical laboratory scientific officer Modified independent    ADL comments Carries laundry in her arm against her body, able to put detergent in with her left hand, able to put clothes in dryer, assist with putting clothes on rack, unload lighter items from dishwasher, able to set the table,      IADL   Prior Level of Function Shopping independent    Shopping Needs to be accompanied on any shopping trip    Prior Level of Function Meal Prep independent    Meal Prep Able to complete simple cold meal and snack prep    Medication Management Takes responsibility if medication is prepared in advance in seperate dosage      Coordination   Right 9 Hole Peg Test 0   unable to complete   Left 9 Hole Peg Test 32      AROM   Right Shoulder Flexion 80 Degrees    Right Shoulder ABduction 38 Degrees    Right Elbow Flexion 130    Right Elbow Extension 0    Right Forearm Pronation 80 Degrees    Right Forearm Supination 70 Degrees    Right Wrist Extension 50 Degrees    Right Wrist Flexion 45 Degrees      Right Hand AROM   R Index  MCP 0-90 -65 Degrees    extension   R Index PIP 0-100 -10 Degrees   extension   R Long  MCP 0-90 -55 Degrees   extension  R Long PIP 0-100 -15 Degrees   extension   R Ring  MCP 0-90 -50 Degrees   extension   R Ring PIP 0-100 -45 Degrees   extension   R Little  MCP 0-90 -65 Degrees   extension   R Little PIP 0-100 -60 Degrees   extension     Hand Function   Right Hand Grip (lbs) 1    Right Hand Lateral Pinch 2 lbs    Right Hand 3 Point Pinch 2 lbs    Left Hand Grip (lbs) 24    Left Hand Lateral Pinch 10 lbs    Left 3 point pinch 15 lbs                              OT Education - 01/24/21 1515     Education Details ROM of digits, reaching tasks, progress    Person(s) Educated Patient    Methods Explanation;Verbal cues;Demonstration    Comprehension Verbalized understanding;Returned demonstration              OT Short Term Goals - 10/31/20 1057       OT SHORT TERM GOAL #5   Title Pt will demonstrate improved LUE fine motor coordination for ADLs as evidenced by decreasing 9 hole peg test score to 25 secs or less.    Baseline 10/31/2020: Left FMC: 32 sec. Left FMC 41 sec visa the 9 hole peg test. 08/08/20: 32 seconds via 9 hole peg    Time 6    Period Weeks    Status On-going    Target Date 12/05/20               OT Long Term Goals - 01/24/21 1124       OT LONG TERM GOAL #1   Title Pt will perform all basic ADLs with supervision    Baseline 01/24/21 assist with bra 12/13/2020 unchanged 6/202/2022: Pt. is able to donn stretchy shirts, donn slide on shoes, donning pants. Pt. has difficylty with managing a bra,shirt buttons, and pant zipper/button.Pt. requires minA from her husband following multiple recent surgeries. Pt. requires minA from her husband. 08/08/20: Pt continues to require MIN A for grooming and dressing tasks.    Time 12    Period Weeks    Status On-going      OT LONG TERM GOAL #2   Title Pt will demonstrate 60* RUE shoulder flexion in prep for functional  reach with pain less than or equal to 3/10.    Baseline 12/13/20:  shoulder flexion right 64 degrees 10/31/20: Pt. continues to present with limited AROM for isolated  right shoulder flexion with pain. 08/08/20: R AROM Shoulder flexion - 45* c 3/10 pain.    Time 12    Period Weeks    Status On-going      OT LONG TERM GOAL #3   Title Pt will use RUE as a stabilizer/ gross A at least 30 % of the time for ADLs/ IADLs.    Baseline Pt. is using her right hand as a gross stabilizer 15% of the time. 08/08/20: requires MAX cues    Time 12    Period Weeks    Status Achieved      OT LONG TERM GOAL #4   Title Pt will increase LUE grip strength to 25 lbs or greater for increased ease with ADLs.    Baseline 12/13/20: 24# left, 1# right 10/31/2020: Pt. continues to  present with limited left grip. Pt. has improved with left grip strength. Pt.continues to work towards 25#. 08/08/20: L grip 19#    Time 12    Period Weeks    Status On-going      OT LONG TERM GOAL #5   Title 10/31/2020: Pt. is now perfroming laundry, loading the dishwasher, assists some with cooking, light meal preparation, setting the table    Baseline 12/13/2020:  continues to assist as needed but uses left hand .  10/13/20: Pt. assists with filling the dishwasher, and performing laundry. Pt. continues to try to help when she can, however her husband mostly performs these tasks. 08/08/20: Husband performs    Time 12    Period Weeks    Status On-going      OT LONG TERM GOAL #6   Title Pt will demonstrate ability to grasp/ release a cup 2/3 trials with RUE.    Baseline 12/13/20:  Continues to work on consistency with grasp and release 10/31/2020: Pt. continues to initiate grasp, and active release of objects, however is unable to grasp/release a cup. 08/08/20: Able to grasp, unable to release x1 trial    Time 12    Period Weeks    Status On-going      OT LONG TERM GOAL #7   Title Pt will demonstrate the ability to place clothing on hangers with use of  bilateral UE and modified technique.    Baseline unable    Time 12    Period Weeks    Status New      OT LONG TERM GOAL #8   Title I with updated HEP.    Baseline 12/13/2020:  Continual changes to HEP 09/12/2020: Pt. conitinues to require assist for her HEP. Pt. requires assist, and cuing for HEP. 08/08/20: reports completing with husband assist for technique    Time 12    Period Weeks    Status Partially Met      OT LONG TERM GOAL  #9   TITLE Pt will demonstrate the ability to don and doff robe after shower with modified independence    Baseline unable    Time 12    Period Weeks    Status New      OT LONG TERM GOAL  #10   TITLE Pt will demonstrate the ability to perform zippers with modified independence and use of adaptive equipment as needed.    Baseline difficulty    Time 12    Period Weeks    Status New                   Plan - 01/24/21 1516     Clinical Impression Statement Pt has continued to make progress in right UE with ROM and strength.  She has 1# of grip, 2# of pinch in the right hand.  Pain in shoulder has decreased to 2/10 with movement.  She continues to demonstrate spasticity in right hand especially on the ulnar side of the hand with ring and small finger but has received recent Dysport injections to help with normalizing spasticity in conjunction with therapy.  Pt remains unable to perform 9 hole peg test, occasionally able to pick up a peg but not able to manipulate to place into the board.  She is able to demonstrate grasp to remove the pegs if placed for her.  She continues to benefit from skilled OT services to maximize safety and independence in necessary daily tasks.    OT Occupational Profile  and History Detailed Assessment- Review of Records and additional review of physical, cognitive, psychosocial history related to current functional performance    Occupational performance deficits (Please refer to evaluation for details): ADL's;IADL's;Rest and  Sleep;Work;Play;Leisure    Body Structure / Function / Physical Skills ADL;Balance;Coordination;Decreased knowledge of precautions;Decreased knowledge of use of DME;Dexterity;Edema;Mobility;Tone;Strength;IADL;Sensation;GMC;Gait;ROM;FMC;Flexibility;Pain;Vision;UE functional use;Endurance    Cognitive Skills Attention;Perception;Problem Solve;Safety Awareness;Thought    Rehab Potential Good    Clinical Decision Making Several treatment options, min-mod task modification necessary    Comorbidities Affecting Occupational Performance: None    Modification or Assistance to Complete Evaluation  Min-Moderate modification of tasks or assist with assess necessary to complete eval    OT Frequency 2x / week    OT Duration 12 weeks    OT Treatment/Interventions Self-care/ADL training;Ultrasound;Energy conservation;Visual/perceptual remediation/compensation;Patient/family education;DME and/or AE instruction;Aquatic Therapy;Paraffin;Gait Training;Passive range of motion;Balance training;Fluidtherapy;Electrical Stimulation;Functional Mobility Training;Splinting;Moist Heat;Therapeutic exercise;Manual Therapy;Cognitive remediation/compensation;Manual lymph drainage;Neuromuscular education;Coping strategies training    Plan Pt is transferring her care to [facility] as it is closer to home. She remains very fearful of moving RUE and responds well to heat, and estim.    Consulted and Agree with Plan of Care Patient             Patient will benefit from skilled therapeutic intervention in order to improve the following deficits and impairments:   Body Structure / Function / Physical Skills: ADL, Balance, Coordination, Decreased knowledge of precautions, Decreased knowledge of use of DME, Dexterity, Edema, Mobility, Tone, Strength, IADL, Sensation, GMC, Gait, ROM, FMC, Flexibility, Pain, Vision, UE functional use, Endurance Cognitive Skills: Attention, Perception, Problem Solve, Safety Awareness, Thought     Visit  Diagnosis: Muscle weakness (generalized)  Other lack of coordination  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, unspecified laterality (HCC)  Acute pain of right shoulder  Pain in right hand  Cerebral aneurysm rupture [facility])    Problem List Patient Active Problem List   Diagnosis Date Noted   Dyspareunia in female 04/05/2020   History of abnormal cervical Pap smear 03/10/2020   GIB (gastrointestinal bleeding) 02/28/2020   Elevated BUN    Prediabetes    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   Cerebral vasospasm    Sinus tachycardia    Dysphagia, post-stroke    Thrombocytopenia (HCC)    Acute blood loss anemia    Brain aneurysm    ICH (intracerebral hemorrhage) ([place]) 01/05/2020   History of adenomatous polyp of colon 02/07/2016   Anatomical narrow angle, 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 [place], [place], [address] Phone: [phone]   Fax:  [phone]  Name: Erika Stanton MRN: [id number] Date of Birth: [birth year]

## 2021-01-26 ENCOUNTER — Ambulatory Visit: Payer: BC Managed Care – PPO | Admitting: Occupational Therapy

## 2021-01-26 ENCOUNTER — Other Ambulatory Visit: Payer: Self-pay

## 2021-01-26 ENCOUNTER — Encounter: Payer: Self-pay | Admitting: Occupational Therapy

## 2021-01-26 DIAGNOSIS — M6281 Muscle weakness (generalized): Secondary | ICD-10-CM

## 2021-01-26 DIAGNOSIS — I69159 Hemiplegia and hemiparesis following nontraumatic intracerebral hemorrhage affecting unspecified side: Secondary | ICD-10-CM

## 2021-01-26 DIAGNOSIS — M25511 Pain in right shoulder: Secondary | ICD-10-CM

## 2021-01-26 DIAGNOSIS — R278 Other lack of coordination: Secondary | ICD-10-CM

## 2021-01-26 DIAGNOSIS — M79641 Pain in right hand: Secondary | ICD-10-CM

## 2021-01-30 ENCOUNTER — Ambulatory Visit: Payer: BC Managed Care – PPO | Admitting: Occupational Therapy

## 2021-01-31 ENCOUNTER — Ambulatory Visit: Payer: BC Managed Care – PPO | Admitting: Occupational Therapy

## 2021-01-31 ENCOUNTER — Other Ambulatory Visit: Payer: Self-pay

## 2021-01-31 DIAGNOSIS — I69159 Hemiplegia and hemiparesis following nontraumatic intracerebral hemorrhage affecting unspecified side: Secondary | ICD-10-CM

## 2021-01-31 DIAGNOSIS — M6281 Muscle weakness (generalized): Secondary | ICD-10-CM

## 2021-01-31 DIAGNOSIS — M25511 Pain in right shoulder: Secondary | ICD-10-CM

## 2021-01-31 DIAGNOSIS — R278 Other lack of coordination: Secondary | ICD-10-CM

## 2021-01-31 DIAGNOSIS — M79641 Pain in right hand: Secondary | ICD-10-CM

## 2021-02-01 ENCOUNTER — Encounter: Payer: Self-pay | Admitting: Occupational Therapy

## 2021-02-01 NOTE — Therapy (Signed)
[place] MAIN [facility] SERVICES [address] [place], [place], [address] Phone: [phone]   Fax:  [phone]  Occupational Therapy Treatment  Patient Details  Name: Erika Stanton MRN: [id number] Date of Birth: 12-05-1955 Referring Provider (OT): Dr. Naaman Plummer   Encounter Date: 01/26/2021   OT End of Session - 01/31/21 1856     Visit Number 91    Number of Visits 104    Date for OT Re-Evaluation 03/09/21    Authorization Type Progress report period starting 09/21/2020    Authorization Time [place]    OT Start Time 1300    OT Stop Time 1346    OT Time Calculation (min) 46 min    Activity Tolerance Patient tolerated treatment well    Behavior During Therapy [facility] for tasks assessed/performed             Past Medical History:  Diagnosis Date   Aneurysm ([place])    Melena     Past Surgical History:  Procedure Laterality Date   CHOLECYSTECTOMY     COLONOSCOPY  11/2017   at [facility]. no recurrent polyps.  suggest repeat surveillance study 11/2022.     COLONOSCOPY W/ POLYPECTOMY  06/2014   Dr Roxy Manns at [facility].  3 adenomatoous polyps, anal fissure.     ESOPHAGOGASTRODUODENOSCOPY (EGD) WITH PROPOFOL N/A 01/27/2020   Procedure: ESOPHAGOGASTRODUODENOSCOPY (EGD) WITH PROPOFOL;  Surgeon: Mauri Pole, MD;  Location: [facility];  Service: Endoscopy;  Laterality: N/A;   IR 3D INDEPENDENT WKST  01/06/2020   IR ANGIO INTRA EXTRACRAN SEL INTERNAL CAROTID BILAT MOD SED  01/06/2020   IR ANGIO VERTEBRAL SEL VERTEBRAL UNI L MOD SED  01/06/2020   IR ANGIOGRAM FOLLOW UP STUDY  01/06/2020   IR ANGIOGRAM FOLLOW UP STUDY  01/06/2020   IR ANGIOGRAM FOLLOW UP STUDY  01/06/2020   IR ANGIOGRAM FOLLOW UP STUDY  01/06/2020   IR ANGIOGRAM FOLLOW UP STUDY  01/06/2020   IR ANGIOGRAM FOLLOW UP STUDY  01/06/2020   IR ANGIOGRAM FOLLOW UP STUDY  01/06/2020   IR ANGIOGRAM FOLLOW UP STUDY  01/06/2020   IR ANGIOGRAM FOLLOW UP STUDY  01/06/2020   IR ANGIOGRAM FOLLOW UP  STUDY  01/06/2020   IR NEURO EACH ADD'L AFTER BASIC UNI RIGHT (MS)  01/06/2020   IR TRANSCATH/EMBOLIZ  01/06/2020   RADIOLOGY WITH ANESTHESIA N/A 01/06/2020   Procedure: IR WITH ANESTHESIA FOR ANEURYSM;  Surgeon: Consuella Lose, MD;  Location: [facility];  Service: Radiology;  Laterality: N/A;   TUBAL LIGATION      There were no vitals filed for this visit.   Subjective Assessment - 01/31/21 1854     Subjective  Pt reports she still has a lot of tightness in her right hand.  Difficulty with opening hand for grasping, does better with pinching.    Pertinent History ICH 01/06/20, Aneurysm. PMH: OA bilateral knees and hands    Patient Stated Goals Pt would like to be as independent as possible.    Currently in Pain? Yes    Pain Score 2     Pain Location Arm    Pain Orientation Right    Pain Descriptors / Indicators Aching    Pain Onset More than a month ago    Pain Frequency Intermittent             Moist heat to right shoulder, forearm and hand for 5 mins prior to ROM and therapeutic exercise.  Skin intact prior to and following  heat.   Pt seen for AAROM for shoulder flexion, ABD, ADD, ER, elbow flexion/ext, supination/pronation, wrist flexion/extension, digit flexion/extension.  Prolonged stretching for digit extension in isolation and then as a group.  Thumb flexion/extension, radial and palmar ABD  Pt engaging in reaching tasks from seated position to place arm from lap to tabletop, forward flexion on table, ABD with cues from therapist and guiding as needed.   Attempts to grasp and release ball pegs to pick up from bucket at waist level and place into grid on tabletop.  Cues for prehension patterns with grasping and for shoulder movements to produce more normal movement patterns rather than compensatory movements and hiking at the right shoulder.    Shoulder elevation, depression, retraction, protraction (on tabletop).  Response to tx: Pt continues to demonstrate increased  spasticity in right hand which limits her ability to pick up and grasp larger items such as reaching for cup or water bottle.  She is able to grasp smaller objects about 1 inch in size utilizing pinch skills but not full hand grasping.  She has a splint for nighttime, resting hand splint and reports she continues to use it consistently but often wakes and removes it.  Continue to work towards goals in plan of care to increase ROM and use of right UE in daily tasks as a gross assist and progressing more towards functional use.                       OT Education - 02/01/21 1855     Education Details importance of daily ROM of right UE, incorporating UE into daily tasks.    Person(s) Educated Patient    Methods Explanation;Verbal cues;Demonstration    Comprehension Verbalized understanding;Returned demonstration              OT Short Term Goals - 10/31/20 1057       OT SHORT TERM GOAL #5   Title Pt will demonstrate improved LUE fine motor coordination for ADLs as evidenced by decreasing 9 hole peg test score to 25 secs or less.    Baseline 10/31/2020: Left FMC: 32 sec. Left FMC 41 sec visa the 9 hole peg test. 08/08/20: 32 seconds via 9 hole peg    Time 6    Period Weeks    Status On-going    Target Date 12/05/20               OT Long Term Goals - 01/24/21 1124       OT LONG TERM GOAL #1   Title Pt will perform all basic ADLs with supervision    Baseline 01/24/21 assist with bra 12/13/2020 unchanged 6/202/2022: Pt. is able to donn stretchy shirts, donn slide on shoes, donning pants. Pt. has difficylty with managing a bra,shirt buttons, and pant zipper/button.Pt. requires minA from her husband following multiple recent surgeries. Pt. requires minA from her husband. 08/08/20: Pt continues to require MIN A for grooming and dressing tasks.    Time 12    Period Weeks    Status On-going      OT LONG TERM GOAL #2   Title Pt will demonstrate 60* RUE shoulder flexion in  prep for functional reach with pain less than or equal to 3/10.    Baseline 12/13/20:  shoulder flexion right 64 degrees 10/31/20: Pt. continues to present with limited AROM for isolated  right shoulder flexion with pain. 08/08/20: R AROM Shoulder flexion - 45* c 3/10 pain.  Time 12    Period Weeks    Status On-going      OT LONG TERM GOAL #3   Title Pt will use RUE as a stabilizer/ gross A at least 30 % of the time for ADLs/ IADLs.    Baseline Pt. is using her right hand as a gross stabilizer 15% of the time. 08/08/20: requires MAX cues    Time 12    Period Weeks    Status Achieved      OT LONG TERM GOAL #4   Title Pt will increase LUE grip strength to 25 lbs or greater for increased ease with ADLs.    Baseline 12/13/20: 24# left, 1# right 10/31/2020: Pt. continues to present with limited left grip. Pt. has improved with left grip strength. Pt.continues to work towards 25#. 08/08/20: L grip 19#    Time 12    Period Weeks    Status On-going      OT LONG TERM GOAL #5   Title 10/31/2020: Pt. is now perfroming laundry, loading the dishwasher, assists some with cooking, light meal preparation, setting the table    Baseline 12/13/2020:  continues to assist as needed but uses left hand .  10/13/20: Pt. assists with filling the dishwasher, and performing laundry. Pt. continues to try to help when she can, however her husband mostly performs these tasks. 08/08/20: Husband performs    Time 12    Period Weeks    Status On-going      OT LONG TERM GOAL #6   Title Pt will demonstrate ability to grasp/ release a cup 2/3 trials with RUE.    Baseline 12/13/20:  Continues to work on consistency with grasp and release 10/31/2020: Pt. continues to initiate grasp, and active release of objects, however is unable to grasp/release a cup. 08/08/20: Able to grasp, unable to release x1 trial    Time 12    Period Weeks    Status On-going      OT LONG TERM GOAL #7   Title Pt will demonstrate the ability to place clothing on  hangers with use of bilateral UE and modified technique.    Baseline unable    Time 12    Period Weeks    Status New      OT LONG TERM GOAL #8   Title I with updated HEP.    Baseline 12/13/2020:  Continual changes to HEP 09/12/2020: Pt. conitinues to require assist for her HEP. Pt. requires assist, and cuing for HEP. 08/08/20: reports completing with husband assist for technique    Time 12    Period Weeks    Status Partially Met      OT LONG TERM GOAL  #9   TITLE Pt will demonstrate the ability to don and doff robe after shower with modified independence    Baseline unable    Time 12    Period Weeks    Status New      OT LONG TERM GOAL  #10   TITLE Pt will demonstrate the ability to perform zippers with modified independence and use of adaptive equipment as needed.    Baseline difficulty    Time 12    Period Weeks    Status New                   Plan - 01/31/21 1857     Clinical Impression Statement Pt continues to demonstrate increased spasticity in right hand which limits her ability to pick up  and grasp larger items such as reaching for cup or water bottle.  She is able to grasp smaller objects about 1 inch in size utilizing pinch skills but not full hand grasping.  She has a splint for nighttime, resting hand splint and reports she continues to use it consistently but often wakes and removes it.  Continue to work towards goals in plan of care to increase ROM and use of right UE in daily tasks as a gross assist and progressing more towards functional use.    OT Occupational Profile and History Detailed Assessment- Review of Records and additional review of physical, cognitive, psychosocial history related to current functional performance    Occupational performance deficits (Please refer to evaluation for details): ADL's;IADL's;Rest and Sleep;Work;Play;Leisure    Body Structure / Function / Physical Skills ADL;Balance;Coordination;Decreased knowledge of  precautions;Decreased knowledge of use of DME;Dexterity;Edema;Mobility;Tone;Strength;IADL;Sensation;GMC;Gait;ROM;FMC;Flexibility;Pain;Vision;UE functional use;Endurance    Cognitive Skills Attention;Perception;Problem Solve;Safety Awareness;Thought    Rehab Potential Good    Clinical Decision Making Several treatment options, min-mod task modification necessary    Comorbidities Affecting Occupational Performance: None    Modification or Assistance to Complete Evaluation  Min-Moderate modification of tasks or assist with assess necessary to complete eval    OT Frequency 2x / week    OT Duration 12 weeks    OT Treatment/Interventions Self-care/ADL training;Ultrasound;Energy conservation;Visual/perceptual remediation/compensation;Patient/family education;DME and/or AE instruction;Aquatic Therapy;Paraffin;Gait Training;Passive range of motion;Balance training;Fluidtherapy;Electrical Stimulation;Functional Mobility Training;Splinting;Moist Heat;Therapeutic exercise;Manual Therapy;Cognitive remediation/compensation;Manual lymph drainage;Neuromuscular education;Coping strategies training    Plan Pt is transferring her care to [facility] OP as it is closer to home. She remains very fearful of moving RUE and responds well to heat, and estim.    Consulted and Agree with Plan of Care Patient             Patient will benefit from skilled therapeutic intervention in order to improve the following deficits and impairments:   Body Structure / Function / Physical Skills: ADL, Balance, Coordination, Decreased knowledge of precautions, Decreased knowledge of use of DME, Dexterity, Edema, Mobility, Tone, Strength, IADL, Sensation, GMC, Gait, ROM, FMC, Flexibility, Pain, Vision, UE functional use, Endurance Cognitive Skills: Attention, Perception, Problem Solve, Safety Awareness, Thought     Visit Diagnosis: Muscle weakness (generalized)  Other lack of coordination  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, unspecified laterality (HCC)  Acute pain of right shoulder  Pain in right hand    Problem List Patient Active Problem List   Diagnosis Date Noted   Dyspareunia in female 04/05/2020   History of abnormal cervical Pap smear 03/10/2020   GIB (gastrointestinal bleeding) 02/28/2020   Elevated BUN    Prediabetes    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   Cerebral vasospasm    Sinus tachycardia    Dysphagia, post-stroke    Thrombocytopenia (HCC)    Acute blood loss anemia    Brain aneurysm    ICH (intracerebral hemorrhage) ([place]) 01/05/2020   History of adenomatous polyp of colon 02/07/2016   Anatomical narrow angle, 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 [place], [place], [address] Phone: [phone]  Fax:  [phone]  Name: SHANARA SCHNIEDERS MRN: [id number] Date of Birth: 11-08-1955

## 2021-02-01 NOTE — Therapy (Signed)
[place] MAIN [facility] SERVICES [address] [place], [place], [address] Phone: [phone]   Fax:  [phone]  Occupational Therapy Treatment  Patient Details  Name: Erika Stanton MRN: [id number] Date of Birth: 1956/05/07 Referring Provider (OT): Dr. Naaman Plummer   Encounter Date: 01/31/2021   OT End of Session - 02/01/21 1913     Visit Number 92    Number of Visits 104    Date for OT Re-Evaluation 03/09/21    Authorization Type Progress report period starting 09/21/2020    Authorization Time [place]    OT Start Time 1051    OT Stop Time 1137    OT Time Calculation (min) 46 min    Activity Tolerance Patient tolerated treatment well    Behavior During Therapy [facility] for tasks assessed/performed             Past Medical History:  Diagnosis Date   Aneurysm ([place])    Melena     Past Surgical History:  Procedure Laterality Date   CHOLECYSTECTOMY     COLONOSCOPY  11/2017   at [facility]. no recurrent polyps.  suggest repeat surveillance study 11/2022.     COLONOSCOPY W/ POLYPECTOMY  06/2014   Dr Roxy Manns at [facility].  3 adenomatoous polyps, anal fissure.     ESOPHAGOGASTRODUODENOSCOPY (EGD) WITH PROPOFOL N/A 01/27/2020   Procedure: ESOPHAGOGASTRODUODENOSCOPY (EGD) WITH PROPOFOL;  Surgeon: Mauri Pole, MD;  Location: [facility];  Service: Endoscopy;  Laterality: N/A;   IR 3D INDEPENDENT WKST  01/06/2020   IR ANGIO INTRA EXTRACRAN SEL INTERNAL CAROTID BILAT MOD SED  01/06/2020   IR ANGIO VERTEBRAL SEL VERTEBRAL UNI L MOD SED  01/06/2020   IR ANGIOGRAM FOLLOW UP STUDY  01/06/2020   IR ANGIOGRAM FOLLOW UP STUDY  01/06/2020   IR ANGIOGRAM FOLLOW UP STUDY  01/06/2020   IR ANGIOGRAM FOLLOW UP STUDY  01/06/2020   IR ANGIOGRAM FOLLOW UP STUDY  01/06/2020   IR ANGIOGRAM FOLLOW UP STUDY  01/06/2020   IR ANGIOGRAM FOLLOW UP STUDY  01/06/2020   IR ANGIOGRAM FOLLOW UP STUDY  01/06/2020   IR ANGIOGRAM FOLLOW UP STUDY  01/06/2020   IR ANGIOGRAM FOLLOW UP  STUDY  01/06/2020   IR NEURO EACH ADD'L AFTER BASIC UNI RIGHT (MS)  01/06/2020   IR TRANSCATH/EMBOLIZ  01/06/2020   RADIOLOGY WITH ANESTHESIA N/A 01/06/2020   Procedure: IR WITH ANESTHESIA FOR ANEURYSM;  Surgeon: Consuella Lose, MD;  Location: [facility];  Service: Radiology;  Laterality: N/A;   TUBAL LIGATION      There were no vitals filed for this visit.   Subjective Assessment - 02/01/21 1909     Subjective  Pt reports she is nervous about her upcoming surgery for her aneursym.  She is currently having left knee pain and wearing a brace today for support.  She states she really needs a knee replacement however she does not want to pursue it at this time since she has had multiple surgeries in the last year.    Pertinent History ICH 01/06/20, Aneurysm. PMH: OA bilateral knees and hands    Patient Stated Goals Pt would like to be as independent as possible.    Currently in Pain? Yes    Pain Score 4     Pain Location Knee    Pain Orientation Left;Right    Pain Descriptors / Indicators Aching    Pain Type Chronic pain    Pain Onset In the past 7 days  Pain Frequency Intermittent             Moist heat to right shoulder, forearm and hand for 5 mins prior to ROM and therapeutic exercise.  Pt engaging in active ROM/AAROM reaching tasks in multi directional planes with therapist guidance at times with attempts of shoulder flexion greater than 80 degrees. Use of dowel to work on facilitating grasp and release with assist from therapist, especially on ulnar side of the hand.  Difficulty with fully opening digits without assist to place onto dowel.     Shoulder elevation, retraction and shoulder rolls.   Prolonged stretching with placing right hand around a graduated cone from smaller end to larger end.    Grasp and release of regular clothespins to pick up and place into a bin.  Cues for prehension and grasping patterns, occasional cues for release.    Response to tx: Pt has chronic  knee pain but today left knee pain worse than right and limiting her mobility.  Pt demonstrates difficulty with finger extension with active reach, especially for patterns which require whole hand grasping/holding of object.  She required guiding from therapist to facilitate opening of ulnar side of right hand.  Unable to produce active pinch with regular sized clothespins with right hand, focused on grasp and release to pick up item and place into a container to encourage reach.  Continue to work towards goals in plan of care to facilitate greater functional use of right UE for daily tasks.                      OT Education - 02/01/21 1912     Education Details grasping patterns with stretch for digits on larger item such as water bottle, hand to mouth patterns    Person(s) Educated Patient    Methods Explanation;Verbal cues;Demonstration    Comprehension Verbalized understanding;Returned demonstration              OT Short Term Goals - 10/31/20 1057       OT SHORT TERM GOAL #5   Title Pt will demonstrate improved LUE fine motor coordination for ADLs as evidenced by decreasing 9 hole peg test score to 25 secs or less.    Baseline 10/31/2020: Left FMC: 32 sec. Left FMC 41 sec visa the 9 hole peg test. 08/08/20: 32 seconds via 9 hole peg    Time 6    Period Weeks    Status On-going    Target Date 12/05/20               OT Long Term Goals - 01/24/21 1124       OT LONG TERM GOAL #1   Title Pt will perform all basic ADLs with supervision    Baseline 01/24/21 assist with bra 12/13/2020 unchanged 6/202/2022: Pt. is able to donn stretchy shirts, donn slide on shoes, donning pants. Pt. has difficylty with managing a bra,shirt buttons, and pant zipper/button.Pt. requires minA from her husband following multiple recent surgeries. Pt. requires minA from her husband. 08/08/20: Pt continues to require MIN A for grooming and dressing tasks.    Time 12    Period Weeks    Status  On-going      OT LONG TERM GOAL #2   Title Pt will demonstrate 60* RUE shoulder flexion in prep for functional reach with pain less than or equal to 3/10.    Baseline 12/13/20:  shoulder flexion right 64 degrees 10/31/20: Pt. continues to present   with limited AROM for isolated  right shoulder flexion with pain. 08/08/20: R AROM Shoulder flexion - 45* c 3/10 pain.    Time 12    Period Weeks    Status On-going      OT LONG TERM GOAL #3   Title Pt will use RUE as a stabilizer/ gross A at least 30 % of the time for ADLs/ IADLs.    Baseline Pt. is using her right hand as a gross stabilizer 15% of the time. 08/08/20: requires MAX cues    Time 12    Period Weeks    Status Achieved      OT LONG TERM GOAL #4   Title Pt will increase LUE grip strength to 25 lbs or greater for increased ease with ADLs.    Baseline 12/13/20: 24# left, 1# right 10/31/2020: Pt. continues to present with limited left grip. Pt. has improved with left grip strength. Pt.continues to work towards 25#. 08/08/20: L grip 19#    Time 12    Period Weeks    Status On-going      OT LONG TERM GOAL #5   Title 10/31/2020: Pt. is now perfroming laundry, loading the dishwasher, assists some with cooking, light meal preparation, setting the table    Baseline 12/13/2020:  continues to assist as needed but uses left hand .  10/13/20: Pt. assists with filling the dishwasher, and performing laundry. Pt. continues to try to help when she can, however her husband mostly performs these tasks. 08/08/20: Husband performs    Time 12    Period Weeks    Status On-going      OT LONG TERM GOAL #6   Title Pt will demonstrate ability to grasp/ release a cup 2/3 trials with RUE.    Baseline 12/13/20:  Continues to work on consistency with grasp and release 10/31/2020: Pt. continues to initiate grasp, and active release of objects, however is unable to grasp/release a cup. 08/08/20: Able to grasp, unable to release x1 trial    Time 12    Period Weeks    Status  On-going      OT LONG TERM GOAL #7   Title Pt will demonstrate the ability to place clothing on hangers with use of bilateral UE and modified technique.    Baseline unable    Time 12    Period Weeks    Status New      OT LONG TERM GOAL #8   Title I with updated HEP.    Baseline 12/13/2020:  Continual changes to HEP 09/12/2020: Pt. conitinues to require assist for her HEP. Pt. requires assist, and cuing for HEP. 08/08/20: reports completing with husband assist for technique    Time 12    Period Weeks    Status Partially Met      OT LONG TERM GOAL  #9   TITLE Pt will demonstrate the ability to don and doff robe after shower with modified independence    Baseline unable    Time 12    Period Weeks    Status New      OT LONG TERM GOAL  #10   TITLE Pt will demonstrate the ability to perform zippers with modified independence and use of adaptive equipment as needed.    Baseline difficulty    Time 12    Period Weeks    Status New                   Plan - 02/01/21  1913     Clinical Impression Statement Pt has chronic knee pain but today left knee pain worse than right and limiting her mobility.  Pt demonstrates difficulty with finger extension with active reach, especially for patterns which require whole hand grasping/holding of object.  She required guiding from therapist to facilitate opening of ulnar side of right hand.  Unable to produce active pinch with regular sized clothespins with right hand, focused on grasp and release to pick up item and place into a container to encourage reach.  Continue to work towards goals in plan of care to facilitate greater functional use of right UE for daily tasks.    OT Occupational Profile and History Detailed Assessment- Review of Records and additional review of physical, cognitive, psychosocial history related to current functional performance    Occupational performance deficits (Please refer to evaluation for details): ADL's;IADL's;Rest  and Sleep;Work;Play;Leisure    Body Structure / Function / Physical Skills ADL;Balance;Coordination;Decreased knowledge of precautions;Decreased knowledge of use of DME;Dexterity;Edema;Mobility;Tone;Strength;IADL;Sensation;GMC;Gait;ROM;FMC;Flexibility;Pain;Vision;UE functional use;Endurance    Cognitive Skills Attention;Perception;Problem Solve;Safety Awareness;Thought    Rehab Potential Good    Clinical Decision Making Several treatment options, min-mod task modification necessary    Comorbidities Affecting Occupational Performance: None    Modification or Assistance to Complete Evaluation  Min-Moderate modification of tasks or assist with assess necessary to complete eval    OT Frequency 2x / week    OT Duration 12 weeks    OT Treatment/Interventions Self-care/ADL training;Ultrasound;Energy conservation;Visual/perceptual remediation/compensation;Patient/family education;DME and/or AE instruction;Aquatic Therapy;Paraffin;Gait Training;Passive range of motion;Balance training;Fluidtherapy;Electrical Stimulation;Functional Mobility Training;Splinting;Moist Heat;Therapeutic exercise;Manual Therapy;Cognitive remediation/compensation;Manual lymph drainage;Neuromuscular education;Coping strategies training    Plan Pt is transferring her care to [facility] as it is closer to home. She remains very fearful of moving RUE and responds well to heat, and estim.    Consulted and Agree with Plan of Care Patient             Patient will benefit from skilled therapeutic intervention in order to improve the following deficits and impairments:   Body Structure / Function / Physical Skills: ADL, Balance, Coordination, Decreased knowledge of precautions, Decreased knowledge of use of DME, Dexterity, Edema, Mobility, Tone, Strength, IADL, Sensation, GMC, Gait, ROM, FMC, Flexibility, Pain, Vision, UE functional use, Endurance Cognitive Skills: Attention, Perception, Problem Solve, Safety Awareness, Thought      Visit Diagnosis: Muscle weakness (generalized)  Other lack of coordination  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, unspecified laterality (HCC)  Acute pain of right shoulder  Pain in right hand    Problem List Patient Active Problem List   Diagnosis Date Noted   Dyspareunia in female 04/05/2020   History of abnormal cervical Pap smear 03/10/2020   GIB (gastrointestinal bleeding) 02/28/2020   Elevated BUN    Prediabetes    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   Cerebral vasospasm    Sinus tachycardia    Dysphagia, post-stroke    Thrombocytopenia (HCC)    Acute blood loss anemia    Brain aneurysm    ICH (intracerebral hemorrhage) (HCC) 01/05/2020   History of adenomatous polyp of colon 02/07/2016   Anatomical narrow angle, 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  

## 2021-02-02 ENCOUNTER — Other Ambulatory Visit: Payer: Self-pay

## 2021-02-02 ENCOUNTER — Ambulatory Visit: Payer: BC Managed Care – PPO

## 2021-02-02 DIAGNOSIS — R278 Other lack of coordination: Secondary | ICD-10-CM

## 2021-02-02 DIAGNOSIS — M6281 Muscle weakness (generalized): Secondary | ICD-10-CM | POA: Diagnosis not present

## 2021-02-02 NOTE — Therapy (Signed)
[place] MAIN [facility] SERVICES [address] [place], [place], [address] Phone: [phone]   Fax:  [phone]  Occupational Therapy Treatment  Patient Details  Name: Erika Stanton MRN: [id number] Date of Birth: Sep 05, 1955 Referring Provider (OT): Dr. Naaman Plummer   Encounter Date: 02/02/2021   OT End of Session - 02/02/21 1306     Visit Number [id number]    Number of Visits 104    Date for OT Re-Evaluation 03/09/21    Authorization Type Progress report period starting 09/21/2020    Authorization Time Period State [employer]    OT Start Time 1300    OT Stop Time 1345    OT Time Calculation (min) 45 min    Activity Tolerance Patient tolerated treatment well    Behavior During Therapy [facility] for tasks assessed/performed             Past Medical History:  Diagnosis Date   Aneurysm ([place])    Melena     Past Surgical History:  Procedure Laterality Date   CHOLECYSTECTOMY     COLONOSCOPY  11/2017   at [facility]. no recurrent polyps.  suggest repeat surveillance study 11/2022.     COLONOSCOPY W/ POLYPECTOMY  06/2014   Dr Roxy Manns at [facility].  3 adenomatoous polyps, anal fissure.     ESOPHAGOGASTRODUODENOSCOPY (EGD) WITH PROPOFOL N/A 01/27/2020   Procedure: ESOPHAGOGASTRODUODENOSCOPY (EGD) WITH PROPOFOL;  Surgeon: Mauri Pole, MD;  Location: [facility];  Service: Endoscopy;  Laterality: N/A;   IR 3D INDEPENDENT WKST  01/06/2020   IR ANGIO INTRA EXTRACRAN SEL INTERNAL CAROTID BILAT MOD SED  01/06/2020   IR ANGIO VERTEBRAL SEL VERTEBRAL UNI L MOD SED  01/06/2020   IR ANGIOGRAM FOLLOW UP STUDY  01/06/2020   IR ANGIOGRAM FOLLOW UP STUDY  01/06/2020   IR ANGIOGRAM FOLLOW UP STUDY  01/06/2020   IR ANGIOGRAM FOLLOW UP STUDY  01/06/2020   IR ANGIOGRAM FOLLOW UP STUDY  01/06/2020   IR ANGIOGRAM FOLLOW UP STUDY  01/06/2020   IR ANGIOGRAM FOLLOW UP STUDY  01/06/2020   IR ANGIOGRAM FOLLOW UP STUDY  01/06/2020   IR ANGIOGRAM FOLLOW UP STUDY  01/06/2020   IR ANGIOGRAM FOLLOW UP  STUDY  01/06/2020   IR NEURO EACH ADD'L AFTER BASIC UNI RIGHT (MS)  01/06/2020   IR TRANSCATH/EMBOLIZ  01/06/2020   RADIOLOGY WITH ANESTHESIA N/A 01/06/2020   Procedure: IR WITH ANESTHESIA FOR ANEURYSM;  Surgeon: Consuella Lose, MD;  Location: [facility];  Service: Radiology;  Laterality: N/A;   TUBAL LIGATION      There were no vitals filed for this visit.   Subjective Assessment - 02/02/21 1305     Subjective  Pt reports increased OA knee pain lately    Pertinent History ICH 01/06/20, Aneurysm. PMH: OA bilateral knees and hands    Repetition Decreases Symptoms    Patient Stated Goals Pt would like to be as independent as possible.    Currently in Pain? Yes    Pain Score 3     Pain Location Knee    Pain Orientation Left    Pain Descriptors / Indicators Aching    Pain Type Chronic pain    Pain Onset In the past 7 days    Pain Frequency Intermittent    Pain Onset More than a month ago               Therapeutic Exercise Moist heat applied to right forearm and shoulder for 5 minutes prior to therapeutic  exercises to increase range of motion, tissue mobility and decrease pain.  Skin inspected prior to and after heat with no issues. Pt tolerated PROM in all joint ranges of the RUE, and hand including shoulder flexion, abduction, horizontal abduction, elbow flexion, extension, forearm supination, wrist flexion, extension, and digit MP flexion, and extension. AROM 1 set x 20 reps each wrist flexion/extension, thumb flexion/extension, elbow flexion/extension, shoulder flexion/abduction seated in chair.   Therapeutic Activity Pt worked on reaching for [employer], and moving them through 3 levels of horizontal rungs placed at progressively higher heights. Pt unable to complete from seated position due to limited shoulder AROM, completed 3 lowest levels in standing.          OT Education - 02/02/21 1306     Education Details pain mgmt, HEP    Person(s) Educated Patient    Methods  Explanation;Verbal cues;Demonstration    Comprehension Verbalized understanding;Returned demonstration              OT Short Term Goals - 10/31/20 1057       OT SHORT TERM GOAL #5   Title Pt will demonstrate improved LUE fine motor coordination for ADLs as evidenced by decreasing 9 hole peg test score to 25 secs or less.    Baseline 10/31/2020: Left FMC: 32 sec. Left FMC 41 sec visa the 9 hole peg test. 08/08/20: 32 seconds via 9 hole peg    Time 6    Period Weeks    Status On-going    Target Date 12/05/20               OT Long Term Goals - 01/24/21 1124       OT LONG TERM GOAL #1   Title Pt will perform all basic ADLs with supervision    Baseline 01/24/21 assist with bra 12/13/2020 unchanged 6/202/2022: Pt. is able to donn stretchy shirts, donn slide on shoes, donning pants. Pt. has difficylty with managing a bra,shirt buttons, and pant zipper/button.Pt. requires minA from her husband following multiple recent surgeries. Pt. requires minA from her husband. 08/08/20: Pt continues to require MIN A for grooming and dressing tasks.    Time 12    Period Weeks    Status On-going      OT LONG TERM GOAL #2   Title Pt will demonstrate 60* RUE shoulder flexion in prep for functional reach with pain less than or equal to 3/10.    Baseline 12/13/20:  shoulder flexion right 64 degrees 10/31/20: Pt. continues to present with limited AROM for isolated  right shoulder flexion with pain. 08/08/20: R AROM Shoulder flexion - 45* c 3/10 pain.    Time 12    Period Weeks    Status On-going      OT LONG TERM GOAL #3   Title Pt will use RUE as a stabilizer/ gross A at least 30 % of the time for ADLs/ IADLs.    Baseline Pt. is using her right hand as a gross stabilizer 15% of the time. 08/08/20: requires MAX cues    Time 12    Period Weeks    Status Achieved      OT LONG TERM GOAL #4   Title Pt will increase LUE grip strength to 25 lbs or greater for increased ease with ADLs.    Baseline 12/13/20:  24# left, 1# right 10/31/2020: Pt. continues to present with limited left grip. Pt. has improved with left grip strength. Pt.continues to work towards 25#. 08/08/20: L grip  19#    Time 12    Period Weeks    Status On-going      OT LONG TERM GOAL #5   Title 10/31/2020: Pt. is now perfroming laundry, loading the dishwasher, assists some with cooking, light meal preparation, setting the table    Baseline 12/13/2020:  continues to assist as needed but uses left hand .  10/13/20: Pt. assists with filling the dishwasher, and performing laundry. Pt. continues to try to help when she can, however her husband mostly performs these tasks. 08/08/20: Husband performs    Time 12    Period Weeks    Status On-going      OT LONG TERM GOAL #6   Title Pt will demonstrate ability to grasp/ release a cup 2/3 trials with RUE.    Baseline 12/13/20:  Continues to work on consistency with grasp and release 10/31/2020: Pt. continues to initiate grasp, and active release of objects, however is unable to grasp/release a cup. 08/08/20: Able to grasp, unable to release x1 trial    Time 12    Period Weeks    Status On-going      OT LONG TERM GOAL #7   Title Pt will demonstrate the ability to place clothing on hangers with use of bilateral UE and modified technique.    Baseline unable    Time 12    Period Weeks    Status New      OT LONG TERM GOAL #8   Title I with updated HEP.    Baseline 12/13/2020:  Continual changes to HEP 09/12/2020: Pt. conitinues to require assist for her HEP. Pt. requires assist, and cuing for HEP. 08/08/20: reports completing with husband assist for technique    Time 12    Period Weeks    Status Partially Met      OT LONG TERM GOAL  #9   TITLE Pt will demonstrate the ability to don and doff robe after shower with modified independence    Baseline unable    Time 12    Period Weeks    Status New      OT LONG TERM GOAL  #10   TITLE Pt will demonstrate the ability to perform zippers with modified  independence and use of adaptive equipment as needed.    Baseline difficulty    Time 12    Period Weeks    Status New                   Plan - 02/02/21 1307     Clinical Impression Statement Pt demonstrates difficulty with finger extension with active reach, especially for patterns which require whole hand grasping/holding of object. Shoulder ROM limiting functional reaching activities in sitting/standing. Continue to work towards goals in plan of care to facilitate greater functional use of right UE for daily tasks.    OT Occupational Profile and History Detailed Assessment- Review of Records and additional review of physical, cognitive, psychosocial history related to current functional performance    Occupational performance deficits (Please refer to evaluation for details): ADL's;IADL's;Rest and Sleep;Work;Play;Leisure    Body Structure / Function / Physical Skills ADL;Balance;Coordination;Decreased knowledge of precautions;Decreased knowledge of use of DME;Dexterity;Edema;Mobility;Tone;Strength;IADL;Sensation;GMC;Gait;ROM;FMC;Flexibility;Pain;Vision;UE functional use;Endurance    Cognitive Skills Attention;Perception;Problem Solve;Safety Awareness;Thought    Rehab Potential Good    Clinical Decision Making Several treatment options, min-mod task modification necessary    Comorbidities Affecting Occupational Performance: None    Modification or Assistance to Complete Evaluation  Min-Moderate modification of tasks or  assist with assess necessary to complete eval    OT Frequency 2x / week    OT Duration 12 weeks    OT Treatment/Interventions Self-care/ADL training;Ultrasound;Energy conservation;Visual/perceptual remediation/compensation;Patient/family education;DME and/or AE instruction;Aquatic Therapy;Paraffin;Gait Training;Passive range of motion;Balance training;Fluidtherapy;Electrical Stimulation;Functional Mobility Training;Splinting;Moist Heat;Therapeutic exercise;Manual  Therapy;Cognitive remediation/compensation;Manual lymph drainage;Neuromuscular education;Coping strategies training    Plan Pt is transferring her care to [facility] as it is closer to home. She remains very fearful of moving RUE and responds well to heat, and estim.    Consulted and Agree with Plan of Care Patient             Patient will benefit from skilled therapeutic intervention in order to improve the following deficits and impairments:   Body Structure / Function / Physical Skills: ADL, Balance, Coordination, Decreased knowledge of precautions, Decreased knowledge of use of DME, Dexterity, Edema, Mobility, Tone, Strength, IADL, Sensation, GMC, Gait, ROM, FMC, Flexibility, Pain, Vision, UE functional use, Endurance Cognitive Skills: Attention, Perception, Problem Solve, Safety Awareness, Thought     Visit Diagnosis: Muscle weakness (generalized)  Other lack of coordination    Problem List Patient Active Problem List   Diagnosis Date Noted   Dyspareunia in female 04/05/2020   History of abnormal cervical Pap smear 03/10/2020   GIB (gastrointestinal bleeding) 02/28/2020   Elevated BUN    Prediabetes    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   Cerebral vasospasm    Sinus tachycardia    Dysphagia, post-stroke    Thrombocytopenia ([facility])    Acute blood loss anemia    Brain aneurysm    ICH (intracerebral hemorrhage) ([place]) 01/05/2020   History of adenomatous polyp of colon 02/07/2016   Anatomical narrow angle, bilateral 12/14/2014   Fissure in ano 11/11/2014   Hemorrhoids 11/11/2014   Constipation 11/19/2013   GERD (gastroesophageal reflux disease) 03/24/2013   Irritable bowel syndrome with diarrhea 03/24/2013   Herpes zoster 05/14/2005   Abnormal Pap smear of cervix 05/15/1983   History of cervical dysplasia 05/15/1983    Dessie Coma, M.S. OTR/L  02/02/21, 1:44 PM  ascom [phone]   [place] MAIN [facility] SERVICES [address] [place], [place], [address] Phone: [phone]   Fax:  [phone]  Name: Erika Stanton MRN: [id number] Date of Birth: Mar 16, 1956

## 2021-02-06 ENCOUNTER — Ambulatory Visit: Payer: BC Managed Care – PPO | Admitting: Occupational Therapy

## 2021-02-07 ENCOUNTER — Ambulatory Visit: Payer: BC Managed Care – PPO | Admitting: Occupational Therapy

## 2021-02-09 ENCOUNTER — Ambulatory Visit: Payer: BC Managed Care – PPO | Admitting: Occupational Therapy

## 2021-02-13 ENCOUNTER — Ambulatory Visit: Payer: BC Managed Care – PPO | Admitting: Occupational Therapy

## 2021-02-14 ENCOUNTER — Other Ambulatory Visit: Payer: Self-pay

## 2021-02-14 ENCOUNTER — Ambulatory Visit: Payer: BC Managed Care – PPO | Attending: Physical Medicine & Rehabilitation | Admitting: Occupational Therapy

## 2021-02-14 DIAGNOSIS — M25511 Pain in right shoulder: Secondary | ICD-10-CM | POA: Insufficient documentation

## 2021-02-14 DIAGNOSIS — M6281 Muscle weakness (generalized): Secondary | ICD-10-CM | POA: Insufficient documentation

## 2021-02-14 DIAGNOSIS — I69159 Hemiplegia and hemiparesis following nontraumatic intracerebral hemorrhage affecting unspecified side: Secondary | ICD-10-CM | POA: Diagnosis present

## 2021-02-14 DIAGNOSIS — M79641 Pain in right hand: Secondary | ICD-10-CM | POA: Diagnosis present

## 2021-02-14 DIAGNOSIS — R278 Other lack of coordination: Secondary | ICD-10-CM | POA: Diagnosis present

## 2021-02-14 DIAGNOSIS — R482 Apraxia: Secondary | ICD-10-CM | POA: Diagnosis present

## 2021-02-14 NOTE — Therapy (Signed)
[place] [facility] SERVICES [address] [place], [place], [address] Phone: [phone]   Fax:  [phone]  Occupational Therapy Treatment  Patient Details  Name: Erika Stanton MRN: [id number] Date of Birth: 11-17-1955 Referring Provider (OT): Dr. Naaman Plummer   Encounter Date: 02/14/2021   OT End of Session - 02/14/21 1354     Visit Number 94    Number of Visits 104    Date for OT Re-Evaluation 03/09/21    Authorization Type Progress report period starting 09/21/2020    Authorization Time [place]    OT Start Time 1302    OT Stop Time 1345    OT Time Calculation (min) 43 min    Activity Tolerance Patient tolerated treatment well    Behavior During Therapy [facility] for tasks assessed/performed             Past Medical History:  Diagnosis Date   Aneurysm ([place])    Mel[place]     Past Surgical History:  Procedure Laterality Date   CHOLECYSTECTOMY     COLONOSCOPY  11/2017   at [facility]. no recurrent polyps.  suggest repeat surveillance study 11/2022.     COLONOSCOPY W/ POLYPECTOMY  06/2014   Dr Roxy Manns at [facility].  3 adenomatoous polyps, anal fissure.     ESOPHAGOGASTRODUODENOSCOPY (EGD) WITH PROPOFOL N/A 01/27/2020   Procedure: ESOPHAGOGASTRODUODENOSCOPY (EGD) WITH PROPOFOL;  Surgeon: Mauri Pole, MD;  Location: [facility];  Service: Endoscopy;  Laterality: N/A;   IR 3D INDEPENDENT WKST  01/06/2020   IR ANGIO INTRA EXTRACRAN SEL INTERNAL CAROTID BILAT MOD SED  01/06/2020   IR ANGIO VERTEBRAL SEL VERTEBRAL UNI L MOD SED  01/06/2020   IR ANGIOGRAM FOLLOW UP STUDY  01/06/2020   IR ANGIOGRAM FOLLOW UP STUDY  01/06/2020   IR ANGIOGRAM FOLLOW UP STUDY  01/06/2020   IR ANGIOGRAM FOLLOW UP STUDY  01/06/2020   IR ANGIOGRAM FOLLOW UP STUDY  01/06/2020   IR ANGIOGRAM FOLLOW UP STUDY  01/06/2020   IR ANGIOGRAM FOLLOW UP STUDY  01/06/2020   IR ANGIOGRAM FOLLOW UP STUDY  01/06/2020   IR ANGIOGRAM FOLLOW UP STUDY  01/06/2020   IR ANGIOGRAM FOLLOW UP  STUDY  01/06/2020   IR NEURO EACH ADD'L AFTER BASIC UNI RIGHT (MS)  01/06/2020   IR TRANSCATH/EMBOLIZ  01/06/2020   RADIOLOGY WITH ANESTHESIA N/A 01/06/2020   Procedure: IR WITH ANESTHESIA FOR ANEURYSM;  Surgeon: Consuella Lose, MD;  Location: [place];  Service: Radiology;  Laterality: N/A;   TUBAL LIGATION      There were no vitals filed for this visit.   Subjective Assessment - 02/14/21 1351     Subjective  Pt. has returned to therapy following surgery for aneurysm coil placement    Patient is accompanied by: Family member    Repetition Decreases Symptoms    Patient Stated Goals Pt would like to be as independent as possible.    Currently in Pain? Yes    Pain Score 2     Pain Location Hand    Pain Orientation Right    Pain Descriptors / Indicators Aching   with movement   Pain Onset More than a month ago    Pain Frequency Intermittent            OT TREATMENT     Therapeutic Exercise:   Pt. was able to tolerate AROM/PROM for shoulder flexion, abduction, elbow flexion, elbow extension, AAROM/PROM for right wrist, digit MP, PIP, and DIP flexion,  and extension, AROM digit MP, PIP, and DIP flexion, extension.Thumb radial, and palmar abduction. Emphasis was placed on right 4th, and 5th digit MP, PIP, and DIP extension. Pt. worked on [employer], and reciprocal motion using the UBE while seated for 5 min. with no resistance. Constant monitoring was provided.   Neuromuscular re-ed:   Pt. worked on using her right hand for grasping [place] style discs from a container, and reaching up to place them into a container placed at the tabletop, as well as at various angles. Pt. worked on grasping the discs from the tabletop, and extending her digits to release the discs in the container. Pt. Alternated weight bearing with a flat open hand at the tabletop at times between reps.  Pt. has returned the clinic to resume OT services following suregry for anerysm coil placement. Pt. has  returned with MD orders for no straining, excess squatting, pushing, pulling, or lifting more than 10 pounds 02-06-2021 through 02/13/2021. Pt. is improving with ROM in the left shoulder flexion, and abduction with reaching, as well as 4th, and 5th digit ROM. Pt. continues to present with tightness in the right 4th, and 5th digits. Pt. continues to work on normalizing tone in the left hand, and improving ROM in preparation for grasping, and handling objects, as well as reaching up to place them a various angles duirng ADLs, and IADL tasks.                 OT Education - 02/14/21 1354     Education Details pain mgmt, HEP    Person(s) Educated Patient    Methods Explanation;Verbal cues;Demonstration    Comprehension Verbalized understanding;Returned demonstration              OT Short Term Goals - 10/31/20 1057       OT SHORT TERM GOAL #5   Title Pt will demonstrate improved LUE fine motor coordination for ADLs as evidenced by decreasing 9 hole peg test score to 25 secs or less.    Baseline 10/31/2020: Left FMC: 32 sec. Left FMC 41 sec visa the 9 hole peg test. 08/08/20: 32 seconds via 9 hole peg    Time 6    Period Weeks    Status On-going    Target Date 12/05/20               OT Long Term Goals - 01/24/21 1124       OT LONG TERM GOAL #1   Title Pt will perform all basic ADLs with supervision    Baseline 01/24/21 assist with bra 12/13/2020 unchanged 6/202/2022: Pt. is able to donn stretchy shirts, donn slide on shoes, donning pants. Pt. has difficylty with managing a bra,shirt buttons, and pant zipper/button.Pt. requires minA from her husband following multiple recent surgeries. Pt. requires minA from her husband. 08/08/20: Pt continues to require MIN A for grooming and dressing tasks.    Time 12    Period Weeks    Status On-going      OT LONG TERM GOAL #2   Title Pt will demonstrate 60* RUE shoulder flexion in prep for functional reach with pain less than or equal  to 3/10.    Baseline 12/13/20:  shoulder flexion right 64 degrees 10/31/20: Pt. continues to present with limited AROM for isolated  right shoulder flexion with pain. 08/08/20: R AROM Shoulder flexion - 45* c 3/10 pain.    Time 12    Period Weeks    Status On-going  OT LONG TERM GOAL #3   Title Pt will use RUE as a stabilizer/ gross A at least 30 % of the time for ADLs/ IADLs.    Baseline Pt. is using her right hand as a gross stabilizer 15% of the time. 08/08/20: requires MAX cues    Time 12    Period Weeks    Status Achieved      OT LONG TERM GOAL #4   Title Pt will increase LUE grip strength to 25 lbs or greater for increased ease with ADLs.    Baseline 12/13/20: 24# left, 1# right 10/31/2020: Pt. continues to present with limited left grip. Pt. has improved with left grip strength. Pt.continues to work towards 25#. 08/08/20: L grip 19#    Time 12    Period Weeks    Status On-going      OT LONG TERM GOAL #5   Title 10/31/2020: Pt. is now perfroming laundry, loading the dishwasher, assists some with cooking, light meal preparation, setting the table    Baseline 12/13/2020:  continues to assist as needed but uses left hand .  10/13/20: Pt. assists with filling the dishwasher, and performing laundry. Pt. continues to try to help when she can, however her husband mostly performs these tasks. 08/08/20: Husband performs    Time 12    Period Weeks    Status On-going      OT LONG TERM GOAL #6   Title Pt will demonstrate ability to grasp/ release a cup 2/3 trials with RUE.    Baseline 12/13/20:  Continues to work on consistency with grasp and release 10/31/2020: Pt. continues to initiate grasp, and active release of objects, however is unable to grasp/release a cup. 08/08/20: Able to grasp, unable to release x1 trial    Time 12    Period Weeks    Status On-going      OT LONG TERM GOAL #7   Title Pt will demonstrate the ability to place clothing on hangers with use of bilateral UE and modified  technique.    Baseline unable    Time 12    Period Weeks    Status New      OT LONG TERM GOAL #8   Title I with updated HEP.    Baseline 12/13/2020:  Continual changes to HEP 09/12/2020: Pt. conitinues to require assist for her HEP. Pt. requires assist, and cuing for HEP. 08/08/20: reports completing with husband assist for technique    Time 12    Period Weeks    Status Partially Met      OT LONG TERM GOAL  #9   TITLE Pt will demonstrate the ability to don and doff robe after shower with modified independence    Baseline unable    Time 12    Period Weeks    Status New      OT LONG TERM GOAL  #10   TITLE Pt will demonstrate the ability to perform zippers with modified independence and use of adaptive equipment as needed.    Baseline difficulty    Time 12    Period Weeks    Status New                   Plan - 02/14/21 1354     Clinical Impression Statement Pt. has returned the clinic to resume OT services following suregry for anerysm coil placement. Pt. has returned with MD orders for no straining, excess squatting, pushing, pulling, or lifting more than 10  pounds 02-06-2021 through 02/13/2021. Pt. is improving with ROM in the left shoulder flexion, and abduction with reaching, as well as 4th, and 5th digit ROM. Pt. continues to present with tightness in the right 4th, and 5th digits. Pt. continues to work on normalizing tone in the left hand, and improving ROM in preparation for grasping, and handling objects, as well as reaching up to place them a various angles duirng ADLs, and IADL tasks.     OT Occupational Profile and History Detailed Assessment- Review of Records and additional review of physical, cognitive, psychosocial history related to current functional performance    Occupational performance deficits (Please refer to evaluation for details): ADL's;IADL's;Rest and Sleep;Work;Play;Leisure    Body Structure / Function / Physical Skills  ADL;Balance;Coordination;Decreased knowledge of precautions;Decreased knowledge of use of DME;Dexterity;Edema;Mobility;Tone;Strength;IADL;Sensation;GMC;Gait;ROM;FMC;Flexibility;Pain;Vision;UE functional use;Endurance    Cognitive Skills Attention;Perception;Problem Solve;Safety Awareness;Thought    Rehab Potential Good    Clinical Decision Making Several treatment options, min-mod task modification necessary    Comorbidities Affecting Occupational Performance: None    Modification or Assistance to Complete Evaluation  Min-Moderate modification of tasks or assist with assess necessary to complete eval    OT Frequency 2x / week    OT Duration 12 weeks    OT Treatment/Interventions Self-care/ADL training;Ultrasound;Energy conservation;Visual/perceptual remediation/compensation;Patient/family education;DME and/or AE instruction;Aquatic Therapy;Paraffin;Gait Training;Passive range of motion;Balance training;Fluidtherapy;Electrical Stimulation;Functional Mobility Training;Splinting;Moist Heat;Therapeutic exercise;Manual Therapy;Cognitive remediation/compensation;Manual lymph drainage;Neuromuscular education;Coping strategies training    Consulted and Agree with Plan of Care Patient             Patient will benefit from skilled therapeutic intervention in order to improve the following deficits and impairments:   Body Structure / Function / Physical Skills: ADL, Balance, Coordination, Decreased knowledge of precautions, Decreased knowledge of use of DME, Dexterity, Edema, Mobility, Tone, Strength, IADL, Sensation, GMC, Gait, ROM, FMC, Flexibility, Pain, Vision, UE functional use, Endurance Cognitive Skills: Attention, Perception, Problem Solve, Safety Awareness, Thought     Visit Diagnosis: Muscle weakness (generalized)  Other lack of coordination    Problem List Patient Active Problem List   Diagnosis Date Noted   Dyspareunia in female 04/05/2020   History of abnormal cervical Pap smear  03/10/2020   GIB (gastrointestinal bleeding) 02/28/2020   Elevated BUN    Prediabetes    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[place]) 01/20/2020   Cerebral vasospasm    Sinus tachycardia    Dysphagia, post-stroke    Thrombocytopenia ([facility])    Acute blood loss anemia    Brain aneurysm    ICH (intracerebral hemorrhage) ([place]) 01/05/2020   History of adenomatous polyp of colon 02/07/2016   Anatomical narrow angle, bilateral 12/14/2014   Fissure in ano 11/11/2014   Hemorrhoids 11/11/2014   Constipation 11/19/2013   GERD (gastroesophageal reflux disease) 03/24/2013   Irritable bowel syndrome with diarrhea 03/24/2013   Herpes zoster 05/14/2005   Abnormal Pap smear of cervix 05/15/1983   History of cervical dysplasia 05/15/1983    Harrel Carina, MS, OTR/L 02/14/2021, 2:03 PM  [place] [address] [place], [place], [address] Phone: [phone]   Fax:  [phone]  Name: ANAMARI GALEAS MRN: [id number] Date of Birth: 12/23/1955

## 2021-02-16 ENCOUNTER — Ambulatory Visit: Payer: BC Managed Care – PPO | Admitting: Occupational Therapy

## 2021-02-16 ENCOUNTER — Other Ambulatory Visit: Payer: Self-pay

## 2021-02-16 DIAGNOSIS — M6281 Muscle weakness (generalized): Secondary | ICD-10-CM

## 2021-02-16 DIAGNOSIS — R278 Other lack of coordination: Secondary | ICD-10-CM

## 2021-02-16 DIAGNOSIS — I69159 Hemiplegia and hemiparesis following nontraumatic intracerebral hemorrhage affecting unspecified side: Secondary | ICD-10-CM

## 2021-02-16 DIAGNOSIS — M79641 Pain in right hand: Secondary | ICD-10-CM

## 2021-02-16 DIAGNOSIS — M25511 Pain in right shoulder: Secondary | ICD-10-CM

## 2021-02-18 ENCOUNTER — Encounter: Payer: Self-pay | Admitting: Occupational Therapy

## 2021-02-18 NOTE — Therapy (Signed)
[place] MAIN [facility] SERVICES [address] [place], [place], [address] Phone: [phone]   Fax:  [phone]  Occupational Therapy Treatment  Patient Details  Name: Erika Stanton MRN: [id number] Date of Birth: 1956/03/23 Referring Provider (OT): Dr. Naaman Plummer   Encounter Date: 02/16/2021   OT End of Session - 02/18/21 2134     Visit Number 95    Number of Visits 104    Date for OT Re-Evaluation 03/09/21    Authorization Type Progress report period starting 09/21/2020    Authorization Time Period State [employer]    OT Start Time 1300    OT Stop Time 1345    OT Time Calculation (min) 45 min    Activity Tolerance Patient tolerated treatment well    Behavior During Therapy [facility] for tasks assessed/performed             Past Medical History:  Diagnosis Date   Aneurysm ([place])    Melena     Past Surgical History:  Procedure Laterality Date   CHOLECYSTECTOMY     COLONOSCOPY  11/2017   at [facility]. no recurrent polyps.  suggest repeat surveillance study 11/2022.     COLONOSCOPY W/ POLYPECTOMY  06/2014   Dr Roxy Manns at [facility].  3 adenomatoous polyps, anal fissure.     ESOPHAGOGASTRODUODENOSCOPY (EGD) WITH PROPOFOL N/A 01/27/2020   Procedure: ESOPHAGOGASTRODUODENOSCOPY (EGD) WITH PROPOFOL;  Surgeon: Mauri Pole, MD;  Location: [facility];  Service: Endoscopy;  Laterality: N/A;   IR 3D INDEPENDENT WKST  01/06/2020   IR ANGIO INTRA EXTRACRAN SEL INTERNAL CAROTID BILAT MOD SED  01/06/2020   IR ANGIO VERTEBRAL SEL VERTEBRAL UNI L MOD SED  01/06/2020   IR ANGIOGRAM FOLLOW UP STUDY  01/06/2020   IR ANGIOGRAM FOLLOW UP STUDY  01/06/2020   IR ANGIOGRAM FOLLOW UP STUDY  01/06/2020   IR ANGIOGRAM FOLLOW UP STUDY  01/06/2020   IR ANGIOGRAM FOLLOW UP STUDY  01/06/2020   IR ANGIOGRAM FOLLOW UP STUDY  01/06/2020   IR ANGIOGRAM FOLLOW UP STUDY  01/06/2020   IR ANGIOGRAM FOLLOW UP STUDY  01/06/2020   IR ANGIOGRAM FOLLOW UP STUDY  01/06/2020   IR ANGIOGRAM FOLLOW UP  STUDY  01/06/2020   IR NEURO EACH ADD'L AFTER BASIC UNI RIGHT (MS)  01/06/2020   IR TRANSCATH/EMBOLIZ  01/06/2020   RADIOLOGY WITH ANESTHESIA N/A 01/06/2020   Procedure: IR WITH ANESTHESIA FOR ANEURYSM;  Surgeon: Consuella Lose, MD;  Location: [place];  Service: Radiology;  Laterality: N/A;   TUBAL LIGATION      There were no vitals filed for this visit.   Subjective Assessment - 02/18/21 2132     Subjective  Pt reports she is doing well after her surgery.    Pertinent History ICH 01/06/20, Aneurysm. PMH: OA bilateral knees and hands    Patient Stated Goals Pt would like to be as independent as possible.    Currently in Pain? Yes    Pain Score 2     Pain Location Hand    Pain Orientation Right    Pain Descriptors / Indicators Aching    Pain Type Chronic pain    Pain Onset More than a month ago    Pain Frequency Intermittent            Therapeutic Exercise: AROM/PROM for shoulder flexion, abduction, elbow flexion, elbow extension, AAROM/PROM for right wrist, digit MP, PIP, and DIP flexion, and extension, AROM digit MP, PIP, and DIP flexion, extension.Thumb radial, and  palmar abduction. Emphasis was placed on right 4th, and 5th digit MP, PIP, and DIP extension.   Grasp and release of 1/2 inch objects this date with right hand, picking up and placing into container on table. Cues for prehension patterns.  Unable to pick up small pegs and place into board but able to remove if they are placed for her.     Response to tx: Pt with recent surgery for aneurysm and doing well, no changes neurologically from pre surgery.  Continued focus on right ring and small finger for extension.  Difficulty noted with manipulation of small objects and use of index and middle finger in combination of thumb.  Continue to work towards goals in plan of care to maximize safety and independence in daily tasks.                       OT Education - 02/18/21 2133     Education Details HEP     Person(s) Educated Patient    Methods Explanation;Verbal cues;Demonstration    Comprehension Verbalized understanding;Returned demonstration              OT Short Term Goals - 10/31/20 1057       OT SHORT TERM GOAL #5   Title Pt will demonstrate improved LUE fine motor coordination for ADLs as evidenced by decreasing 9 hole peg test score to 25 secs or less.    Baseline 10/31/2020: Left FMC: 32 sec. Left FMC 41 sec visa the 9 hole peg test. 08/08/20: 32 seconds via 9 hole peg    Time 6    Period Weeks    Status On-going    Target Date 12/05/20               OT Long Term Goals - 01/24/21 1124       OT LONG TERM GOAL #1   Title Pt will perform all basic ADLs with supervision    Baseline 01/24/21 assist with bra 12/13/2020 unchanged 6/202/2022: Pt. is able to donn stretchy shirts, donn slide on shoes, donning pants. Pt. has difficylty with managing a bra,shirt buttons, and pant zipper/button.Pt. requires minA from her husband following multiple recent surgeries. Pt. requires minA from her husband. 08/08/20: Pt continues to require MIN A for grooming and dressing tasks.    Time 12    Period Weeks    Status On-going      OT LONG TERM GOAL #2   Title Pt will demonstrate 60* RUE shoulder flexion in prep for functional reach with pain less than or equal to 3/10.    Baseline 12/13/20:  shoulder flexion right 64 degrees 10/31/20: Pt. continues to present with limited AROM for isolated  right shoulder flexion with pain. 08/08/20: R AROM Shoulder flexion - 45* c 3/10 pain.    Time 12    Period Weeks    Status On-going      OT LONG TERM GOAL #3   Title Pt will use RUE as a stabilizer/ gross A at least 30 % of the time for ADLs/ IADLs.    Baseline Pt. is using her right hand as a gross stabilizer 15% of the time. 08/08/20: requires MAX cues    Time 12    Period Weeks    Status Achieved      OT LONG TERM GOAL #4   Title Pt will increase LUE grip strength to 25 lbs or greater for increased  ease with ADLs.    Baseline  12/13/20: 24# left, 1# right 10/31/2020: Pt. continues to present with limited left grip. Pt. has improved with left grip strength. Pt.continues to work towards 25#. 08/08/20: L grip 19#    Time 12    Period Weeks    Status On-going      OT LONG TERM GOAL #5   Title 10/31/2020: Pt. is now perfroming laundry, loading the dishwasher, assists some with cooking, light meal preparation, setting the table    Baseline 12/13/2020:  continues to assist as needed but uses left hand .  10/13/20: Pt. assists with filling the dishwasher, and performing laundry. Pt. continues to try to help when she can, however her husband mostly performs these tasks. 08/08/20: Husband performs    Time 12    Period Weeks    Status On-going      OT LONG TERM GOAL #6   Title Pt will demonstrate ability to grasp/ release a cup 2/3 trials with RUE.    Baseline 12/13/20:  Continues to work on consistency with grasp and release 10/31/2020: Pt. continues to initiate grasp, and active release of objects, however is unable to grasp/release a cup. 08/08/20: Able to grasp, unable to release x1 trial    Time 12    Period Weeks    Status On-going      OT LONG TERM GOAL #7   Title Pt will demonstrate the ability to place clothing on hangers with use of bilateral UE and modified technique.    Baseline unable    Time 12    Period Weeks    Status New      OT LONG TERM GOAL #8   Title I with updated HEP.    Baseline 12/13/2020:  Continual changes to HEP 09/12/2020: Pt. conitinues to require assist for her HEP. Pt. requires assist, and cuing for HEP. 08/08/20: reports completing with husband assist for technique    Time 12    Period Weeks    Status Partially Met      OT LONG TERM GOAL  #9   TITLE Pt will demonstrate the ability to don and doff robe after shower with modified independence    Baseline unable    Time 12    Period Weeks    Status New      OT LONG TERM GOAL  #10   TITLE Pt will demonstrate the ability  to perform zippers with modified independence and use of adaptive equipment as needed.    Baseline difficulty    Time 12    Period Weeks    Status New                   Plan - 02/18/21 2135     Clinical Impression Statement Pt with recent surgery for aneurysm and doing well, no changes neurologically from pre surgery.  Continued focus on right ring and small finger for extension.  Difficulty noted with manipulation of small objects and use of index and middle finger in combination of thumb.  Continue to work towards goals in plan of care to maximize safety and independence in daily tasks.    OT Occupational Profile and History Detailed Assessment- Review of Records and additional review of physical, cognitive, psychosocial history related to current functional performance    Occupational performance deficits (Please refer to evaluation for details): ADL's;IADL's;Rest and Sleep;Work;Play;Leisure    Body Structure / Function / Physical Skills ADL;Balance;Coordination;Decreased knowledge of precautions;Decreased knowledge of use of DME;Dexterity;Edema;Mobility;Tone;Strength;IADL;Sensation;GMC;Gait;ROM;FMC;Flexibility;Pain;Vision;UE functional use;Endurance    Cognitive Skills  Attention;Perception;Problem Solve;Safety Awareness;Thought    Rehab Potential Good    Clinical Decision Making Several treatment options, min-mod task modification necessary    Comorbidities Affecting Occupational Performance: None    Modification or Assistance to Complete Evaluation  Min-Moderate modification of tasks or assist with assess necessary to complete eval    OT Frequency 2x / week    OT Duration 12 weeks    OT Treatment/Interventions Self-care/ADL training;Ultrasound;Energy conservation;Visual/perceptual remediation/compensation;Patient/family education;DME and/or AE instruction;Aquatic Therapy;Paraffin;Gait Training;Passive range of motion;Balance training;Fluidtherapy;Electrical Stimulation;Functional  Mobility Training;Splinting;Moist Heat;Therapeutic exercise;Manual Therapy;Cognitive remediation/compensation;Manual lymph drainage;Neuromuscular education;Coping strategies training    Consulted and Agree with Plan of Care Patient             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, unspecified laterality (HCC)  Acute pain of right shoulder  Pain in right hand    Problem List Patient Active Problem List   Diagnosis Date Noted   Dyspareunia in female 04/05/2020   History of abnormal cervical Pap smear 03/10/2020   GIB (gastrointestinal bleeding) 02/28/2020   Elevated BUN    Prediabetes    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[place]) 01/20/2020   Cerebral vasospasm    Sinus tachycardia    Dysphagia, post-stroke    Thrombocytopenia (HCC)    Acute blood loss anemia    Brain aneurysm    ICH (intracerebral hemorrhage) ([place]) 01/05/2020   History of adenomatous polyp of colon 02/07/2016   Anatomical narrow angle, bilateral 12/14/2014   Fissure in ano 11/11/2014   Hemorrhoids 11/11/2014   Constipation 11/19/2013   GERD (gastroesophageal reflux disease) 03/24/2013   Irritable bowel syndrome with diarrhea 03/24/2013   Herpes zoster 05/14/2005   Abnormal Pap smear of cervix 05/15/1983   History of cervical dysplasia 05/15/1983   Erika Stanton, OTR/L, CLT  Uriah Trueba, OT/L 02/18/2021, 9:45 PM  [place] [address] [place], [place], [address] Phone: [phone]   Fax:  [phone]  Name: RAYDEN SCHEPER MRN: [id number] Date of Birth: 09/14/1955

## 2021-02-20 ENCOUNTER — Ambulatory Visit: Payer: BC Managed Care – PPO | Admitting: Occupational Therapy

## 2021-02-20 ENCOUNTER — Other Ambulatory Visit: Payer: Self-pay

## 2021-02-20 DIAGNOSIS — M6281 Muscle weakness (generalized): Secondary | ICD-10-CM | POA: Diagnosis not present

## 2021-02-20 DIAGNOSIS — I69159 Hemiplegia and hemiparesis following nontraumatic intracerebral hemorrhage affecting unspecified side: Secondary | ICD-10-CM

## 2021-02-20 DIAGNOSIS — R278 Other lack of coordination: Secondary | ICD-10-CM

## 2021-02-20 DIAGNOSIS — M79641 Pain in right hand: Secondary | ICD-10-CM

## 2021-02-20 DIAGNOSIS — M25511 Pain in right shoulder: Secondary | ICD-10-CM

## 2021-02-21 ENCOUNTER — Ambulatory Visit: Payer: BC Managed Care – PPO | Admitting: Occupational Therapy

## 2021-02-23 ENCOUNTER — Other Ambulatory Visit: Payer: Self-pay

## 2021-02-23 ENCOUNTER — Encounter: Payer: Self-pay | Admitting: Occupational Therapy

## 2021-02-23 ENCOUNTER — Ambulatory Visit: Payer: BC Managed Care – PPO | Admitting: Occupational Therapy

## 2021-02-23 DIAGNOSIS — M25511 Pain in right shoulder: Secondary | ICD-10-CM

## 2021-02-23 DIAGNOSIS — M6281 Muscle weakness (generalized): Secondary | ICD-10-CM

## 2021-02-23 DIAGNOSIS — M79641 Pain in right hand: Secondary | ICD-10-CM

## 2021-02-23 DIAGNOSIS — R278 Other lack of coordination: Secondary | ICD-10-CM

## 2021-02-23 DIAGNOSIS — I69159 Hemiplegia and hemiparesis following nontraumatic intracerebral hemorrhage affecting unspecified side: Secondary | ICD-10-CM

## 2021-02-23 NOTE — Therapy (Signed)
[place] MAIN [facility] SERVICES [address] [place], [place], [address] Phone: [phone]   Fax:  [phone]  Occupational Therapy Treatment  Patient Details  Name: Erika Stanton MRN: [id number] Date of Birth: 1956-03-31 Referring Provider (OT): Dr. Naaman Plummer   Encounter Date: 02/23/2021   OT End of Session - 02/23/21 1446     Visit Number 97    Number of Visits 104    Date for OT Re-Evaluation 03/09/21    Authorization Type Progress report period starting 09/21/2020    Authorization Time Period State BCBS    OT Start Time 1301    OT Stop Time 1345    OT Time Calculation (min) 44 min    Activity Tolerance Patient tolerated treatment well    Behavior During Therapy [facility] for tasks assessed/performed             Past Medical History:  Diagnosis Date   Aneurysm ([place])    Melena     Past Surgical History:  Procedure Laterality Date   CHOLECYSTECTOMY     COLONOSCOPY  11/2017   at [facility]. no recurrent polyps.  suggest repeat surveillance study 11/2022.     COLONOSCOPY W/ POLYPECTOMY  06/2014   Dr Roxy Manns at [facility].  3 adenomatoous polyps, anal fissure.     ESOPHAGOGASTRODUODENOSCOPY (EGD) WITH PROPOFOL N/A 01/27/2020   Procedure: ESOPHAGOGASTRODUODENOSCOPY (EGD) WITH PROPOFOL;  Surgeon: Mauri Pole, MD;  Location: [facility]COPY;  Service: Endoscopy;  Laterality: N/A;   IR 3D INDEPENDENT WKST  01/06/2020   IR ANGIO INTRA EXTRACRAN SEL INTERNAL CAROTID BILAT MOD SED  01/06/2020   IR ANGIO VERTEBRAL SEL VERTEBRAL UNI L MOD SED  01/06/2020   IR ANGIOGRAM FOLLOW UP STUDY  01/06/2020   IR ANGIOGRAM FOLLOW UP STUDY  01/06/2020   IR ANGIOGRAM FOLLOW UP STUDY  01/06/2020   IR ANGIOGRAM FOLLOW UP STUDY  01/06/2020   IR ANGIOGRAM FOLLOW UP STUDY  01/06/2020   IR ANGIOGRAM FOLLOW UP STUDY  01/06/2020   IR ANGIOGRAM FOLLOW UP STUDY  01/06/2020   IR ANGIOGRAM FOLLOW UP STUDY  01/06/2020   IR ANGIOGRAM FOLLOW UP STUDY  01/06/2020   IR ANGIOGRAM FOLLOW UP  STUDY  01/06/2020   IR NEURO EACH ADD'L AFTER BASIC UNI RIGHT (MS)  01/06/2020   IR TRANSCATH/EMBOLIZ  01/06/2020   RADIOLOGY WITH ANESTHESIA N/A 01/06/2020   Procedure: IR WITH ANESTHESIA FOR ANEURYSM;  Surgeon: Consuella Lose, MD;  Location: [place];  Service: Radiology;  Laterality: N/A;   TUBAL LIGATION      There were no vitals filed for this visit.   Subjective Assessment - 02/23/21 1442     Subjective  Patient reports her visit with her sister went well and her sister is back home again in [place].  She reports she is planning to formally retire by November and will need to reassess her insurance, visits and deductibles when her insurance changes.    Pertinent History ICH 01/06/20, Aneurysm. PMH: OA bilateral knees and hands    Patient Stated Goals Pt would like to be as independent as possible.    Currently in Pain? Yes    Pain Score 2     Pain Location Hand    Pain Orientation Right    Pain Descriptors / Indicators Aching    Pain Type Chronic pain    Pain Onset More than a month ago    Pain Frequency Intermittent  Moist heat to right shoulder forearm and hand for 5 minutes  prior to therapeutic exercise.    Therapeutic exercise: Pt brought in another small package with a solar dancer inside to share with the clinic, requested patient use bilateral UEs to open package to be able to remove and place the item on the table.  She was able to complete independently with effort.  Pt seen for PROM followed by A/AAROM for RUE for shoulder flexion, ABD, ER, elbow flexion/extension with hand to mouth patterns, supination/pronation, wrist flexion/extension and prolonged stretching to digits on right hand.   Patient seen for grasp and release of jumbo pegs, difficulty grasping from container and requires assistance to place pegs in right hand using index, middle and thumb.  Patient worked towards placing pegs into board on table, and then removing once all were placed.   Patient required cues for reaching techniques, grasp and release patterns and decrease of shoulder substitution  Reviewed stretch for webspace on the right hand and recommended she use a water bottle with filled halfway with water, as she tolerates increased stretch with water bottle placed in web space for grasping.  Response to treatment: Patient reports she has not tried using the water bottle at home for webspace stretching but will try.  Continue to encourage patient to perform daily range of motion and stretching of the right hand with emphasis on web space as well as ulnar side of the hand for ring and small finger.  With grasp and release of jumbo pegs, the patient dropping items frequently and requires assist to retrieve objects from floor.  Patient has difficulty with manipulating items to turn and place into specific spots.  Continue to work towards goals to improve right hand function for necessary daily tasks.                    OT Education - 02/23/21 1445     Education Details ROM, web space stretch, holding items    Person(s) Educated Patient    Methods Explanation;Verbal cues;Demonstration    Comprehension Verbalized understanding;Returned demonstration              OT Short Term Goals - 10/31/20 1057       OT SHORT TERM GOAL #5   Title Pt will demonstrate improved LUE fine motor coordination for ADLs as evidenced by decreasing 9 hole peg test score to 25 secs or less.    Baseline 10/31/2020: Left FMC: 32 sec. Left FMC 41 sec visa the 9 hole peg test. 08/08/20: 32 seconds via 9 hole peg    Time 6    Period Weeks    Status On-going    Target Date 12/05/20               OT Long Term Goals - 01/24/21 1124       OT LONG TERM GOAL #1   Title Pt will perform all basic ADLs with supervision    Baseline 01/24/21 assist with bra 12/13/2020 unchanged 6/202/2022: Pt. is able to donn stretchy shirts, donn slide on shoes, donning pants. Pt. has difficylty  with managing a bra,shirt buttons, and pant zipper/button.Pt. requires minA from her husband following multiple recent surgeries. Pt. requires minA from her husband. 08/08/20: Pt continues to require MIN A for grooming and dressing tasks.    Time 12    Period Weeks    Status On-going      OT LONG TERM GOAL #2   Title Pt will  demonstrate 60* RUE shoulder flexion in prep for functional reach with pain less than or equal to 3/10.    Baseline 12/13/20:  shoulder flexion right 64 degrees 10/31/20: Pt. continues to present with limited AROM for isolated  right shoulder flexion with pain. 08/08/20: R AROM Shoulder flexion - 45* c 3/10 pain.    Time 12    Period Weeks    Status On-going      OT LONG TERM GOAL #3   Title Pt will use RUE as a stabilizer/ gross A at least 30 % of the time for ADLs/ IADLs.    Baseline Pt. is using her right hand as a gross stabilizer 15% of the time. 08/08/20: requires MAX cues    Time 12    Period Weeks    Status Achieved      OT LONG TERM GOAL #4   Title Pt will increase LUE grip strength to 25 lbs or greater for increased ease with ADLs.    Baseline 12/13/20: 24# left, 1# right 10/31/2020: Pt. continues to present with limited left grip. Pt. has improved with left grip strength. Pt.continues to work towards 25#. 08/08/20: L grip 19#    Time 12    Period Weeks    Status On-going      OT LONG TERM GOAL #5   Title 10/31/2020: Pt. is now perfroming laundry, loading the dishwasher, assists some with cooking, light meal preparation, setting the table    Baseline 12/13/2020:  continues to assist as needed but uses left hand .  10/13/20: Pt. assists with filling the dishwasher, and performing laundry. Pt. continues to try to help when she can, however her husband mostly performs these tasks. 08/08/20: Husband performs    Time 12    Period Weeks    Status On-going      OT LONG TERM GOAL #6   Title Pt will demonstrate ability to grasp/ release a cup 2/3 trials with RUE.    Baseline  12/13/20:  Continues to work on consistency with grasp and release 10/31/2020: Pt. continues to initiate grasp, and active release of objects, however is unable to grasp/release a cup. 08/08/20: Able to grasp, unable to release x1 trial    Time 12    Period Weeks    Status On-going      OT LONG TERM GOAL #7   Title Pt will demonstrate the ability to place clothing on hangers with use of bilateral UE and modified technique.    Baseline unable    Time 12    Period Weeks    Status New      OT LONG TERM GOAL #8   Title I with updated HEP.    Baseline 12/13/2020:  Continual changes to HEP 09/12/2020: Pt. conitinues to require assist for her HEP. Pt. requires assist, and cuing for HEP. 08/08/20: reports completing with husband assist for technique    Time 12    Period Weeks    Status Partially Met      OT LONG TERM GOAL  #9   TITLE Pt will demonstrate the ability to don and doff robe after shower with modified independence    Baseline unable    Time 12    Period Weeks    Status New      OT LONG TERM GOAL  #10   TITLE Pt will demonstrate the ability to perform zippers with modified independence and use of adaptive equipment as needed.    Baseline difficulty  Time 12    Period Weeks    Status New                   Plan - 02/23/21 1446     Clinical Impression Statement Patient reports she has not tried using the water bottle at home for webspace stretching but will try.  Continue to encourage patient to perform daily range of motion and stretching of the right hand with emphasis on web space as well as ulnar side of the hand for ring and small finger.  With grasp and release of jumbo pegs, the patient dropping items frequently and requires assist to retrieve objects from floor.  Patient has difficulty with manipulating items to turn and place into specific spots.  Continue to work towards goals to improve right hand function for necessary daily tasks.    OT Occupational Profile and  History Detailed Assessment- Review of Records and additional review of physical, cognitive, psychosocial history related to current functional performance    Occupational performance deficits (Please refer to evaluation for details): ADL's;IADL's;Rest and Sleep;Work;Play;Leisure    Body Structure / Function / Physical Skills ADL;Balance;Coordination;Decreased knowledge of precautions;Decreased knowledge of use of DME;Dexterity;Edema;Mobility;Tone;Strength;IADL;Sensation;GMC;Gait;ROM;FMC;Flexibility;Pain;Vision;UE functional use;Endurance    Cognitive Skills Attention;Perception;Problem Solve;Safety Awareness;Thought    Rehab Potential Good    Clinical Decision Making Several treatment options, min-mod task modification necessary    Comorbidities Affecting Occupational Performance: None    Modification or Assistance to Complete Evaluation  Min-Moderate modification of tasks or assist with assess necessary to complete eval    OT Frequency 2x / week    OT Duration 12 weeks    OT Treatment/Interventions Self-care/ADL training;Ultrasound;Energy conservation;Visual/perceptual remediation/compensation;Patient/family education;DME and/or AE instruction;Aquatic Therapy;Paraffin;Gait Training;Passive range of motion;Balance training;Fluidtherapy;Electrical Stimulation;Functional Mobility Training;Splinting;Moist Heat;Therapeutic exercise;Manual Therapy;Cognitive remediation/compensation;Manual lymph drainage;Neuromuscular education;Coping strategies training    Consulted and Agree with Plan of Care Patient             Patient will benefit from skilled therapeutic intervention in order to improve the following deficits and impairments:   Body Structure / Function / Physical Skills: ADL, Balance, Coordination, Decreased knowledge of precautions, Decreased knowledge of use of DME, Dexterity, Edema, Mobility, Tone, Strength, IADL, Sensation, GMC, Gait, ROM, FMC, Flexibility, Pain, Vision, UE functional use,  Endurance Cognitive Skills: Attention, Perception, Problem Solve, Safety Awareness, Thought     Visit Diagnosis: Other lack of coordination  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, unspecified laterality (HCC)  Acute pain of right shoulder  Pain in right hand  Muscle weakness (generalized)    Problem List Patient Active Problem List   Diagnosis Date Noted   Dyspareunia in female 04/05/2020   History of abnormal cervical Pap smear 03/10/2020   GIB (gastrointestinal bleeding) 02/28/2020   Elevated BUN    Prediabetes    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   Cerebral vasospasm    Sinus tachycardia    Dysphagia, post-stroke    Thrombocytopenia (HCC)    Acute blood loss anemia    Brain aneurysm    ICH (intracerebral hemorrhage) ([place]) 01/05/2020   History of adenomatous polyp of colon 02/07/2016   Anatomical narrow angle, 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

## 2021-02-23 NOTE — Therapy (Signed)
[place] MAIN [facility] SERVICES [address] [place], [place], [address] Phone: [phone]   Fax:  [phone]  Occupational Therapy Treatment  Patient Details  Name: Erika Stanton MRN: [id number] Date of Birth: Apr 10, 1956 Referring Provider (OT): Dr. Naaman Plummer   Encounter Date: 02/20/2021   OT End of Session - 02/23/21 1432     Visit Number 96    Number of Visits 104    Date for OT Re-Evaluation 03/09/21    Authorization Type Progress report period starting 09/21/2020    Authorization Time [place]    OT Start Time 1149    OT Stop Time 1232    OT Time Calculation (min) 43 min    Activity Tolerance Patient tolerated treatment well    Behavior During Therapy [facility] for tasks assessed/performed             Past Medical History:  Diagnosis Date   Aneurysm ([place])    Melena     Past Surgical History:  Procedure Laterality Date   CHOLECYSTECTOMY     COLONOSCOPY  11/2017   at [facility]. no recurrent polyps.  suggest repeat surveillance study 11/2022.     COLONOSCOPY W/ POLYPECTOMY  06/2014   Dr Roxy Manns at [facility].  3 adenomatoous polyps, anal fissure.     ESOPHAGOGASTRODUODENOSCOPY (EGD) WITH PROPOFOL N/A 01/27/2020   Procedure: ESOPHAGOGASTRODUODENOSCOPY (EGD) WITH PROPOFOL;  Surgeon: Mauri Pole, MD;  Location: [facility];  Service: Endoscopy;  Laterality: N/A;   IR 3D INDEPENDENT WKST  01/06/2020   IR ANGIO INTRA EXTRACRAN SEL INTERNAL CAROTID BILAT MOD SED  01/06/2020   IR ANGIO VERTEBRAL SEL VERTEBRAL UNI L MOD SED  01/06/2020   IR ANGIOGRAM FOLLOW UP STUDY  01/06/2020   IR ANGIOGRAM FOLLOW UP STUDY  01/06/2020   IR ANGIOGRAM FOLLOW UP STUDY  01/06/2020   IR ANGIOGRAM FOLLOW UP STUDY  01/06/2020   IR ANGIOGRAM FOLLOW UP STUDY  01/06/2020   IR ANGIOGRAM FOLLOW UP STUDY  01/06/2020   IR ANGIOGRAM FOLLOW UP STUDY  01/06/2020   IR ANGIOGRAM FOLLOW UP STUDY  01/06/2020   IR ANGIOGRAM FOLLOW UP STUDY  01/06/2020   IR ANGIOGRAM FOLLOW UP  STUDY  01/06/2020   IR NEURO EACH ADD'L AFTER BASIC UNI RIGHT (MS)  01/06/2020   IR TRANSCATH/EMBOLIZ  01/06/2020   RADIOLOGY WITH ANESTHESIA N/A 01/06/2020   Procedure: IR WITH ANESTHESIA FOR ANEURYSM;  Surgeon: Consuella Lose, MD;  Location: [place];  Service: Radiology;  Laterality: N/A;   TUBAL LIGATION      There were no vitals filed for this visit.   Subjective Assessment - 02/23/21 1431     Subjective  Pt reports her sister came to visit and brought her to therapy today.  Pt reports they had a good weekend together, went to an open house and is meeting a friend at lunch.    Pertinent History ICH 01/06/20, Aneurysm. PMH: OA bilateral knees and hands    Patient Stated Goals Pt would like to be as independent as possible.    Currently in Pain? Yes    Pain Score 2     Pain Location Hand    Pain Orientation Right    Pain Descriptors / Indicators Aching    Pain Type Chronic pain    Pain Frequency Intermittent             Pt brought in a small package with a solar dancer inside to share with the clinic, requested  patient use bilateral UEs to open package to be able to remove and place the item on the table.  She was able to complete independently with effort and increased time allowed.   Pt seen for PROM followed by A/AAROM for RUE for shoulder flexion, ABD, ER, elbow flexion/extension with hand to mouth patterns, supination/pronation, wrist flexion/extension and prolonged stretching to digits on right hand. Pt engaging in grasp and release patterns with variety of small ball such as ping pong, golf and hacky sack balls to remove from container and place onto tabletop.  Cues at times for grasping pattern and to extend fingers more to place fingers around objects for grasp.     Response to tx: Pt continues to progress with right UE, continue to demonstrate limitations in full hand grasp and release patterns due to increased spasticity on the ulnar side of the hand.  She has tightness in  her right thumb webspace and encouraged her to work on stretching and attempts to grasp larger objects such as a water bottle at home.  Pt able to use index, middle and thumb with 3 point pinch/grasp pattern when picking up objects.  Continue towards goals in plan of care to improve right UE function.                       OT Education - 02/23/21 1432     Education Details grasping patterns    Person(s) Educated Patient    Methods Explanation;Verbal cues;Demonstration    Comprehension Verbalized understanding;Returned demonstration              OT Short Term Goals - 10/31/20 1057       OT SHORT TERM GOAL #5   Title Pt will demonstrate improved LUE fine motor coordination for ADLs as evidenced by decreasing 9 hole peg test score to 25 secs or less.    Baseline 10/31/2020: Left FMC: 32 sec. Left FMC 41 sec visa the 9 hole peg test. 08/08/20: 32 seconds via 9 hole peg    Time 6    Period Weeks    Status On-going    Target Date 12/05/20               OT Long Term Goals - 01/24/21 1124       OT LONG TERM GOAL #1   Title Pt will perform all basic ADLs with supervision    Baseline 01/24/21 assist with bra 12/13/2020 unchanged 6/202/2022: Pt. is able to donn stretchy shirts, donn slide on shoes, donning pants. Pt. has difficylty with managing a bra,shirt buttons, and pant zipper/button.Pt. requires minA from her husband following multiple recent surgeries. Pt. requires minA from her husband. 08/08/20: Pt continues to require MIN A for grooming and dressing tasks.    Time 12    Period Weeks    Status On-going      OT LONG TERM GOAL #2   Title Pt will demonstrate 60* RUE shoulder flexion in prep for functional reach with pain less than or equal to 3/10.    Baseline 12/13/20:  shoulder flexion right 64 degrees 10/31/20: Pt. continues to present with limited AROM for isolated  right shoulder flexion with pain. 08/08/20: R AROM Shoulder flexion - 45* c 3/10 pain.    Time 12     Period Weeks    Status On-going      OT LONG TERM GOAL #3   Title Pt will use RUE as a stabilizer/ gross A at least  30 % of the time for ADLs/ IADLs.    Baseline Pt. is using her right hand as a gross stabilizer 15% of the time. 08/08/20: requires MAX cues    Time 12    Period Weeks    Status Achieved      OT LONG TERM GOAL #4   Title Pt will increase LUE grip strength to 25 lbs or greater for increased ease with ADLs.    Baseline 12/13/20: 24# left, 1# right 10/31/2020: Pt. continues to present with limited left grip. Pt. has improved with left grip strength. Pt.continues to work towards 25#. 08/08/20: L grip 19#    Time 12    Period Weeks    Status On-going      OT LONG TERM GOAL #5   Title 10/31/2020: Pt. is now perfroming laundry, loading the dishwasher, assists some with cooking, light meal preparation, setting the table    Baseline 12/13/2020:  continues to assist as needed but uses left hand .  10/13/20: Pt. assists with filling the dishwasher, and performing laundry. Pt. continues to try to help when she can, however her husband mostly performs these tasks. 08/08/20: Husband performs    Time 12    Period Weeks    Status On-going      OT LONG TERM GOAL #6   Title Pt will demonstrate ability to grasp/ release a cup 2/3 trials with RUE.    Baseline 12/13/20:  Continues to work on consistency with grasp and release 10/31/2020: Pt. continues to initiate grasp, and active release of objects, however is unable to grasp/release a cup. 08/08/20: Able to grasp, unable to release x1 trial    Time 12    Period Weeks    Status On-going      OT LONG TERM GOAL #7   Title Pt will demonstrate the ability to place clothing on hangers with use of bilateral UE and modified technique.    Baseline unable    Time 12    Period Weeks    Status New      OT LONG TERM GOAL #8   Title I with updated HEP.    Baseline 12/13/2020:  Continual changes to HEP 09/12/2020: Pt. conitinues to require assist for her HEP.  Pt. requires assist, and cuing for HEP. 08/08/20: reports completing with husband assist for technique    Time 12    Period Weeks    Status Partially Met      OT LONG TERM GOAL  #9   TITLE Pt will demonstrate the ability to don and doff robe after shower with modified independence    Baseline unable    Time 12    Period Weeks    Status New      OT LONG TERM GOAL  #10   TITLE Pt will demonstrate the ability to perform zippers with modified independence and use of adaptive equipment as needed.    Baseline difficulty    Time 12    Period Weeks    Status New                   Plan - 02/23/21 1433     Clinical Impression Statement Pt continues to progress with right UE, continue to demonstrate limitations in full hand grasp and release patterns due to increased spasticity on the ulnar side of the hand.  She has tightness in her right thumb webspace and encouraged her to work on stretching and attempts to grasp larger objects such  as a water bottle at home.  Pt able to use index, middle and thumb with 3 point pinch/grasp pattern when picking up objects.  Continue towards goals in plan of care to improve right UE function.    OT Occupational Profile and History Detailed Assessment- Review of Records and additional review of physical, cognitive, psychosocial history related to current functional performance    Occupational performance deficits (Please refer to evaluation for details): ADL's;IADL's;Rest and Sleep;Work;Play;Leisure    Body Structure / Function / Physical Skills ADL;Balance;Coordination;Decreased knowledge of precautions;Decreased knowledge of use of DME;Dexterity;Edema;Mobility;Tone;Strength;IADL;Sensation;GMC;Gait;ROM;FMC;Flexibility;Pain;Vision;UE functional use;Endurance    Cognitive Skills Attention;Perception;Problem Solve;Safety Awareness;Thought    Rehab Potential Good    Clinical Decision Making Several treatment options, min-mod task modification necessary     Comorbidities Affecting Occupational Performance: None    Modification or Assistance to Complete Evaluation  Min-Moderate modification of tasks or assist with assess necessary to complete eval    OT Frequency 2x / week    OT Duration 12 weeks    OT Treatment/Interventions Self-care/ADL training;Ultrasound;Energy conservation;Visual/perceptual remediation/compensation;Patient/family education;DME and/or AE instruction;Aquatic Therapy;Paraffin;Gait Training;Passive range of motion;Balance training;Fluidtherapy;Electrical Stimulation;Functional Mobility Training;Splinting;Moist Heat;Therapeutic exercise;Manual Therapy;Cognitive remediation/compensation;Manual lymph drainage;Neuromuscular education;Coping strategies training    Consulted and Agree with Plan of Care Patient             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, unspecified laterality (HCC)  Acute pain of right shoulder  Pain in right hand    Problem List Patient Active Problem List   Diagnosis Date Noted   Dyspareunia in female 04/05/2020   History of abnormal cervical Pap smear 03/10/2020   GIB (gastrointestinal bleeding) 02/28/2020   Elevated BUN    Prediabetes    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[place]) 01/20/2020   Cerebral vasospasm    Sinus tachycardia    Dysphagia, post-stroke    Thrombocytopenia (HCC)    Acute blood loss anemia     Brain aneurysm    ICH (intracerebral hemorrhage) ([place]) 01/05/2020   History of adenomatous polyp of colon 02/07/2016   Anatomical narrow angle, bilateral 12/14/2014   Fissure in ano 11/11/2014   Hemorrhoids 11/11/2014   Constipation 11/19/2013   GERD (gastroesophageal reflux disease) 03/24/2013   Irritable bowel syndrome with diarrhea 03/24/2013   Herpes zoster 05/14/2005   Abnormal Pap smear of cervix 05/15/1983   History of cervical dysplasia 05/15/1983   Vicenta Olds T Tomasita Morrow, OTR/L, CLT  Shequilla Goodgame, OT/L 02/23/2021, 2:41 PM  [place] [facility] [address] [place], [place], [address] Phone: [phone]   Fax:  [phone]  Name: Erika Stanton MRN: [id number] Date of Birth: May 05, 1956

## 2021-02-27 ENCOUNTER — Ambulatory Visit: Payer: BC Managed Care – PPO | Admitting: Occupational Therapy

## 2021-02-27 ENCOUNTER — Other Ambulatory Visit: Payer: Self-pay

## 2021-02-27 DIAGNOSIS — M6281 Muscle weakness (generalized): Secondary | ICD-10-CM | POA: Diagnosis not present

## 2021-02-27 DIAGNOSIS — R278 Other lack of coordination: Secondary | ICD-10-CM

## 2021-02-27 DIAGNOSIS — I69159 Hemiplegia and hemiparesis following nontraumatic intracerebral hemorrhage affecting unspecified side: Secondary | ICD-10-CM

## 2021-02-27 NOTE — Therapy (Signed)
[place] MAIN [facility] SERVICES [address] [place], [place], [address] Phone: [phone]   Fax:  [phone]  Occupational Therapy Treatment  Patient Details  Name: Erika Stanton MRN: [id number] Date of Birth: 10/27/1955 Referring Provider (OT): Dr. Naaman Plummer   Encounter Date: 02/27/2021   OT End of Session - 02/27/21 1302     Visit Number 98    Number of Visits 104    Date for OT Re-Evaluation 03/09/21    Authorization Type Progress report period starting 09/21/2020    Authorization Time [place]    OT Start Time 1255    OT Stop Time 1340    OT Time Calculation (min) 45 min    Activity Tolerance Patient tolerated treatment well    Behavior During Therapy [facility] for tasks assessed/performed             Past Medical History:  Diagnosis Date   Aneurysm ([place])    Melena     Past Surgical History:  Procedure Laterality Date   CHOLECYSTECTOMY     COLONOSCOPY  11/2017   at [facility]. no recurrent polyps.  suggest repeat surveillance study 11/2022.     COLONOSCOPY W/ POLYPECTOMY  06/2014   Dr Roxy Manns at [facility].  3 adenomatoous polyps, anal fissure.     ESOPHAGOGASTRODUODENOSCOPY (EGD) WITH PROPOFOL N/A 01/27/2020   Procedure: ESOPHAGOGASTRODUODENOSCOPY (EGD) WITH PROPOFOL;  Surgeon: Mauri Pole, MD;  Location: [place] [facility];  Service: Endoscopy;  Laterality: N/A;   IR 3D INDEPENDENT WKST  01/06/2020   IR ANGIO INTRA EXTRACRAN SEL INTERNAL CAROTID BILAT MOD SED  01/06/2020   IR ANGIO VERTEBRAL SEL VERTEBRAL UNI L MOD SED  01/06/2020   IR ANGIOGRAM FOLLOW UP STUDY  01/06/2020   IR ANGIOGRAM FOLLOW UP STUDY  01/06/2020   IR ANGIOGRAM FOLLOW UP STUDY  01/06/2020   IR ANGIOGRAM FOLLOW UP STUDY  01/06/2020   IR ANGIOGRAM FOLLOW UP STUDY  01/06/2020   IR ANGIOGRAM FOLLOW UP STUDY  01/06/2020   IR ANGIOGRAM FOLLOW UP STUDY  01/06/2020   IR ANGIOGRAM FOLLOW UP STUDY  01/06/2020   IR ANGIOGRAM FOLLOW UP STUDY  01/06/2020   IR ANGIOGRAM FOLLOW UP  STUDY  01/06/2020   IR NEURO EACH ADD'L AFTER BASIC UNI RIGHT (MS)  01/06/2020   IR TRANSCATH/EMBOLIZ  01/06/2020   RADIOLOGY WITH ANESTHESIA N/A 01/06/2020   Procedure: IR WITH ANESTHESIA FOR ANEURYSM;  Surgeon: Consuella Lose, MD;  Location: [place];  Service: Radiology;  Laterality: N/A;   TUBAL LIGATION      There were no vitals filed for this visit.   Subjective Assessment - 02/27/21 1301     Subjective  Pt reports starting her water bottle web space stretches.    Pertinent History ICH 01/06/20, Aneurysm. PMH: OA bilateral knees and hands    Repetition Decreases Symptoms    Patient Stated Goals Pt would like to be as independent as possible.    Currently in Pain? Yes    Pain Score 2     Pain Location Hand    Pain Orientation Right    Pain Descriptors / Indicators Aching    Pain Type Chronic pain    Pain Onset More than a month ago    Pain Frequency Intermittent    Pain Onset More than a month ago              Therapeutic Exercise Pt seen for A/AROM for RUE for shoulder flexion, ABD, ER, elbow flexion/extension  with hand to mouth patterns, supination/pronation, wrist flexion/extension and prolonged stretching to digits on right hand.   Neuromuscular Re-education Pt worked on grasping washers of various sizes from magnetic bowl and placing on tabletop using R hand to grasp, L hand provided assist to stabilize bowl. Pt requires increased rest breaks and cues to complete with pincher grip.  Pt. worked on extending her digits to release the discs onto tabletop. Pt alternated weight bearing with a flat open hand at the tabletop at times between reps.             OT Education - 02/27/21 1302     Education Details ROM, web space stretch, holding items    Person(s) Educated Patient    Methods Explanation;Verbal cues;Demonstration    Comprehension Verbalized understanding;Returned demonstration              OT Short Term Goals - 10/31/20 1057       OT SHORT  TERM GOAL #5   Title Pt will demonstrate improved LUE fine motor coordination for ADLs as evidenced by decreasing 9 hole peg test score to 25 secs or less.    Baseline 10/31/2020: Left FMC: 32 sec. Left FMC 41 sec visa the 9 hole peg test. 08/08/20: 32 seconds via 9 hole peg    Time 6    Period Weeks    Status On-going    Target Date 12/05/20               OT Long Term Goals - 01/24/21 1124       OT LONG TERM GOAL #1   Title Pt will perform all basic ADLs with supervision    Baseline 01/24/21 assist with bra 12/13/2020 unchanged 6/202/2022: Pt. is able to donn stretchy shirts, donn slide on shoes, donning pants. Pt. has difficylty with managing a bra,shirt buttons, and pant zipper/button.Pt. requires minA from her husband following multiple recent surgeries. Pt. requires minA from her husband. 08/08/20: Pt continues to require MIN A for grooming and dressing tasks.    Time 12    Period Weeks    Status On-going      OT LONG TERM GOAL #2   Title Pt will demonstrate 60* RUE shoulder flexion in prep for functional reach with pain less than or equal to 3/10.    Baseline 12/13/20:  shoulder flexion right 64 degrees 10/31/20: Pt. continues to present with limited AROM for isolated  right shoulder flexion with pain. 08/08/20: R AROM Shoulder flexion - 45* c 3/10 pain.    Time 12    Period Weeks    Status On-going      OT LONG TERM GOAL #3   Title Pt will use RUE as a stabilizer/ gross A at least 30 % of the time for ADLs/ IADLs.    Baseline Pt. is using her right hand as a gross stabilizer 15% of the time. 08/08/20: requires MAX cues    Time 12    Period Weeks    Status Achieved      OT LONG TERM GOAL #4   Title Pt will increase LUE grip strength to 25 lbs or greater for increased ease with ADLs.    Baseline 12/13/20: 24# left, 1# right 10/31/2020: Pt. continues to present with limited left grip. Pt. has improved with left grip strength. Pt.continues to work towards 25#. 08/08/20: L grip 19#     Time 12    Period Weeks    Status On-going  OT LONG TERM GOAL #5   Title 10/31/2020: Pt. is now perfroming laundry, loading the dishwasher, assists some with cooking, light meal preparation, setting the table    Baseline 12/13/2020:  continues to assist as needed but uses left hand .  10/13/20: Pt. assists with filling the dishwasher, and performing laundry. Pt. continues to try to help when she can, however her husband mostly performs these tasks. 08/08/20: Husband performs    Time 12    Period Weeks    Status On-going      OT LONG TERM GOAL #6   Title Pt will demonstrate ability to grasp/ release a cup 2/3 trials with RUE.    Baseline 12/13/20:  Continues to work on consistency with grasp and release 10/31/2020: Pt. continues to initiate grasp, and active release of objects, however is unable to grasp/release a cup. 08/08/20: Able to grasp, unable to release x1 trial    Time 12    Period Weeks    Status On-going      OT LONG TERM GOAL #7   Title Pt will demonstrate the ability to place clothing on hangers with use of bilateral UE and modified technique.    Baseline unable    Time 12    Period Weeks    Status New      OT LONG TERM GOAL #8   Title I with updated HEP.    Baseline 12/13/2020:  Continual changes to HEP 09/12/2020: Pt. conitinues to require assist for her HEP. Pt. requires assist, and cuing for HEP. 08/08/20: reports completing with husband assist for technique    Time 12    Period Weeks    Status Partially Met      OT LONG TERM GOAL  #9   TITLE Pt will demonstrate the ability to don and doff robe after shower with modified independence    Baseline unable    Time 12    Period Weeks    Status New      OT LONG TERM GOAL  #10   TITLE Pt will demonstrate the ability to perform zippers with modified independence and use of adaptive equipment as needed.    Baseline difficulty    Time 12    Period Weeks    Status New                   Plan - 02/27/21 1303      Clinical Impression Statement Patient reports she started using the water bottle at home for webspace stretching.  Pt completed [employer] completing 4/6 types of latches with increased time in vertical position (unable to complete in horizontal position. Cues, incereased time, and stablizer assist to pinch large/medium washers from magnetic bowl, unable to complete with small washers. Patient has difficulty with manipulating items to turn and place into specific spots.  Continue to work towards goals to improve right hand function for necessary daily tasks.    OT Occupational Profile and History Detailed Assessment- Review of Records and additional review of physical, cognitive, psychosocial history related to current functional performance    Occupational performance deficits (Please refer to evaluation for details): ADL's;IADL's;Rest and Sleep;Work;Play;Leisure    Body Structure / Function / Physical Skills ADL;Balance;Coordination;Decreased knowledge of precautions;Decreased knowledge of use of DME;Dexterity;Edema;Mobility;Tone;Strength;IADL;Sensation;GMC;Gait;ROM;FMC;Flexibility;Pain;Vision;UE functional use;Endurance    Cognitive Skills Attention;Perception;Problem Solve;Safety Awareness;Thought    Rehab Potential Good    Clinical Decision Making Several treatment options, min-mod task modification necessary    Comorbidities Affecting Occupational Performance: None  Modification or Assistance to Complete Evaluation  Min-Moderate modification of tasks or assist with assess necessary to complete eval    OT Frequency 2x / week    OT Duration 12 weeks    OT Treatment/Interventions Self-care/ADL training;Ultrasound;Energy conservation;Visual/perceptual remediation/compensation;Patient/family education;DME and/or AE instruction;Aquatic Therapy;Paraffin;Gait Training;Passive range of motion;Balance training;Fluidtherapy;Electrical Stimulation;Functional Mobility Training;Splinting;Moist Heat;Therapeutic  exercise;Manual Therapy;Cognitive remediation/compensation;Manual lymph drainage;Neuromuscular education;Coping strategies training    Consulted and Agree with Plan of Care Patient             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, unspecified laterality (HCC)    Problem List Patient Active Problem List   Diagnosis Date Noted   Dyspareunia in female 04/05/2020   History of abnormal cervical Pap smear 03/10/2020   GIB (gastrointestinal bleeding) 02/28/2020   Elevated BUN    Prediabetes    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[place]) 01/20/2020   Cerebral vasospasm    Sinus tachycardia    Dysphagia, post-stroke    Thrombocytopenia (HCC)    Acute blood loss anemia    Brain aneurysm    ICH (intracerebral hemorrhage) ([place]) 01/05/2020   History of adenomatous polyp of colon 02/07/2016   Anatomical narrow angle, bilateral 12/14/2014   Fissure in ano 11/11/2014   Hemorrhoids 11/11/2014   Constipation 11/19/2013   GERD (gastroesophageal reflux disease) 03/24/2013   Irritable bowel syndrome with diarrhea 03/24/2013   Herpes zoster 05/14/2005   Abnormal Pap smear of cervix 05/15/1983   History of cervical dysplasia 05/15/1983     Dessie Coma, M.S. OTR/L  02/27/21, 1:36 PM  ascom [phone]   [place] [facility] MAIN [facility] SERVICES [address] [place], [place],  [address] Phone: [phone]   Fax:  [phone]  Name: Erika Stanton MRN: [id number] Date of Birth: Jan 19, 1956

## 2021-02-28 ENCOUNTER — Ambulatory Visit: Payer: BC Managed Care – PPO | Admitting: Occupational Therapy

## 2021-03-02 ENCOUNTER — Other Ambulatory Visit: Payer: Self-pay

## 2021-03-02 ENCOUNTER — Ambulatory Visit: Payer: BC Managed Care – PPO

## 2021-03-02 DIAGNOSIS — M6281 Muscle weakness (generalized): Secondary | ICD-10-CM

## 2021-03-02 DIAGNOSIS — I69159 Hemiplegia and hemiparesis following nontraumatic intracerebral hemorrhage affecting unspecified side: Secondary | ICD-10-CM

## 2021-03-02 DIAGNOSIS — R278 Other lack of coordination: Secondary | ICD-10-CM

## 2021-03-03 NOTE — Therapy (Signed)
[place] MAIN [facility] SERVICES [address] [place], [place], [address] Phone: [phone]   Fax:  [phone]  Occupational Therapy Treatment  Patient Details  Name: Erika Stanton MRN: [id number] Date of Birth: 10-Jun-1955 No data recorded  Encounter Date: 03/02/2021   OT End of Session - 03/03/21 1309     Visit Number 99    Number of Visits 104    Date for OT Re-Evaluation 03/09/21    Authorization Type Progress report period starting 09/21/2020    Authorization Time Period State [employer]    OT Start Time 1300    OT Stop Time 1345    OT Time Calculation (min) 45 min    Activity Tolerance Patient tolerated treatment well    Behavior During Therapy [facility] for tasks assessed/performed             Past Medical History:  Diagnosis Date   Aneurysm ([place])    Melena     Past Surgical History:  Procedure Laterality Date   CHOLECYSTECTOMY     COLONOSCOPY  11/2017   at [facility]. no recurrent polyps.  suggest repeat surveillance study 11/2022.     COLONOSCOPY W/ POLYPECTOMY  06/2014   Dr Roxy Manns at [facility].  3 adenomatoous polyps, anal fissure.     ESOPHAGOGASTRODUODENOSCOPY (EGD) WITH PROPOFOL N/A 01/27/2020   Procedure: ESOPHAGOGASTRODUODENOSCOPY (EGD) WITH PROPOFOL;  Surgeon: Mauri Pole, MD;  Location: [facility];  Service: Endoscopy;  Laterality: N/A;   IR 3D INDEPENDENT WKST  01/06/2020   IR ANGIO INTRA EXTRACRAN SEL INTERNAL CAROTID BILAT MOD SED  01/06/2020   IR ANGIO VERTEBRAL SEL VERTEBRAL UNI L MOD SED  01/06/2020   IR ANGIOGRAM FOLLOW UP STUDY  01/06/2020   IR ANGIOGRAM FOLLOW UP STUDY  01/06/2020   IR ANGIOGRAM FOLLOW UP STUDY  01/06/2020   IR ANGIOGRAM FOLLOW UP STUDY  01/06/2020   IR ANGIOGRAM FOLLOW UP STUDY  01/06/2020   IR ANGIOGRAM FOLLOW UP STUDY  01/06/2020   IR ANGIOGRAM FOLLOW UP STUDY  01/06/2020   IR ANGIOGRAM FOLLOW UP STUDY  01/06/2020   IR ANGIOGRAM FOLLOW UP STUDY  01/06/2020   IR ANGIOGRAM FOLLOW UP STUDY  01/06/2020    IR NEURO EACH ADD'L AFTER BASIC UNI RIGHT (MS)  01/06/2020   IR TRANSCATH/EMBOLIZ  01/06/2020   RADIOLOGY WITH ANESTHESIA N/A 01/06/2020   Procedure: IR WITH ANESTHESIA FOR ANEURYSM;  Surgeon: Consuella Lose, MD;  Location: [facility];  Service: Radiology;  Laterality: N/A;   TUBAL LIGATION      There were no vitals filed for this visit.   Subjective Assessment - 03/02/21 1306     Subjective  Pt states she has another Botox round for her arm coming up in Dec.    Patient is accompanied by: Family member    Pertinent History ICH 01/06/20, Aneurysm. PMH: OA bilateral knees and hands    Repetition Decreases Symptoms    Patient Stated Goals Pt would like to be as independent as possible.    Currently in Pain? Yes    Pain Score 1     Pain Location Hand    Pain Orientation Right    Pain Descriptors / Indicators Aching;Tightness    Pain Type Chronic pain    Pain Radiating Towards shoulder, hand    Pain Onset More than a month ago    Pain Frequency Intermittent    Aggravating Factors  activity    Pain Relieving Factors rest/heat    Effect of  Pain on Daily Activities decreased activity tolerance when using RUE    Multiple Pain Sites No    Pain Onset More than a month ago           Occupational Therapy Treatment: Manual Therapy: Moist heat applied to R shoulder, R forearm, and hand x10  min for pain reduction, muscle relaxation with simultaneous passive stretching throughout RUE, working to increase ROM for self care performance.  Performed R shoulder passive flex/abd/ER, R forearm pron/sup, R wrist and digit extension, passive digit flexion to tolerance.  Intermittent stretching for R hand digit extension throughout session to minimize flexor tone after pinching activities.   Neuro re-ed: Addressed therapy resistant clothespins with light resistance yellow and red pins, placing onto vertical dowel.  Moved dowel to facilitate R shoulder flexion and abduction, with OT providing tactile cues to  minimize R shoulder shrug while reaching, and providing min guard through distal RUE to facilitate elbow extension during forward reach while helping to depress R shoulder.   Response to Treatment: See Plan/clinical impression below.     OT Education - 03/02/21 1308     Education Details normalized movement patterns for reaching with RUE    Person(s) Educated Patient    Methods Explanation;Verbal cues;Demonstration    Comprehension Verbalized understanding;Returned demonstration;Tactile cues required;Verbal cues required;Need further instruction              OT Short Term Goals - 10/31/20 1057       OT SHORT TERM GOAL #5   Title Pt will demonstrate improved LUE fine motor coordination for ADLs as evidenced by decreasing 9 hole peg test score to 25 secs or less.    Baseline 10/31/2020: Left FMC: 32 sec. Left FMC 41 sec visa the 9 hole peg test. 08/08/20: 32 seconds via 9 hole peg    Time 6    Period Weeks    Status On-going    Target Date 12/05/20               OT Long Term Goals - 01/24/21 1124       OT LONG TERM GOAL #1   Title Pt will perform all basic ADLs with supervision    Baseline 01/24/21 assist with bra 12/13/2020 unchanged 6/202/2022: Pt. is able to donn stretchy shirts, donn slide on shoes, donning pants. Pt. has difficylty with managing a bra,shirt buttons, and pant zipper/button.Pt. requires minA from her husband following multiple recent surgeries. Pt. requires minA from her husband. 08/08/20: Pt continues to require MIN A for grooming and dressing tasks.    Time 12    Period Weeks    Status On-going      OT LONG TERM GOAL #2   Title Pt will demonstrate 60* RUE shoulder flexion in prep for functional reach with pain less than or equal to 3/10.    Baseline 12/13/20:  shoulder flexion right 64 degrees 10/31/20: Pt. continues to present with limited AROM for isolated  right shoulder flexion with pain. 08/08/20: R AROM Shoulder flexion - 45* c 3/10 pain.    Time 12     Period Weeks    Status On-going      OT LONG TERM GOAL #3   Title Pt will use RUE as a stabilizer/ gross A at least 30 % of the time for ADLs/ IADLs.    Baseline Pt. is using her right hand as a gross stabilizer 15% of the time. 08/08/20: requires MAX cues    Time 12  Period Weeks    Status Achieved      OT LONG TERM GOAL #4   Title Pt will increase LUE grip strength to 25 lbs or greater for increased ease with ADLs.    Baseline 12/13/20: 24# left, 1# right 10/31/2020: Pt. continues to present with limited left grip. Pt. has improved with left grip strength. Pt.continues to work towards 25#. 08/08/20: L grip 19#    Time 12    Period Weeks    Status On-going      OT LONG TERM GOAL #5   Title 10/31/2020: Pt. is now perfroming laundry, loading the dishwasher, assists some with cooking, light meal preparation, setting the table    Baseline 12/13/2020:  continues to assist as needed but uses left hand .  10/13/20: Pt. assists with filling the dishwasher, and performing laundry. Pt. continues to try to help when she can, however her husband mostly performs these tasks. 08/08/20: Husband performs    Time 12    Period Weeks    Status On-going      OT LONG TERM GOAL #6   Title Pt will demonstrate ability to grasp/ release a cup 2/3 trials with RUE.    Baseline 12/13/20:  Continues to work on consistency with grasp and release 10/31/2020: Pt. continues to initiate grasp, and active release of objects, however is unable to grasp/release a cup. 08/08/20: Able to grasp, unable to release x1 trial    Time 12    Period Weeks    Status On-going      OT LONG TERM GOAL #7   Title Pt will demonstrate the ability to place clothing on hangers with use of bilateral UE and modified technique.    Baseline unable    Time 12    Period Weeks    Status New      OT LONG TERM GOAL #8   Title I with updated HEP.    Baseline 12/13/2020:  Continual changes to HEP 09/12/2020: Pt. conitinues to require assist for her HEP.  Pt. requires assist, and cuing for HEP. 08/08/20: reports completing with husband assist for technique    Time 12    Period Weeks    Status Partially Met      OT LONG TERM GOAL  #9   TITLE Pt will demonstrate the ability to don and doff robe after shower with modified independence    Baseline unable    Time 12    Period Weeks    Status New      OT LONG TERM GOAL  #10   TITLE Pt will demonstrate the ability to perform zippers with modified independence and use of adaptive equipment as needed.    Baseline difficulty    Time 12    Period Weeks    Status New              Plan - 03/02/21 1319     Clinical Impression Statement Pt tolerated yellow and red pins well for pinching with R hand, with sufficient digit extension to set up hand for pinching and then releasing of pins approximately 75% of the time. Pt requires min guard to assist with normalized movement patterns for forward reaching in order to increase elbow extension while depressing R shoulder.  Continued skilled OT will address functional use of RUE for reaching and object manipulation during self care tasks.    OT Occupational Profile and History Detailed Assessment- Review of Records and additional review of physical, cognitive, psychosocial history  related to current functional performance    Occupational performance deficits (Please refer to evaluation for details): ADL's;IADL's;Rest and Sleep;Work;Play;Leisure    Body Structure / Function / Physical Skills ADL;Balance;Coordination;Decreased knowledge of precautions;Decreased knowledge of use of DME;Dexterity;Edema;Mobility;Tone;Strength;IADL;Sensation;GMC;Gait;ROM;FMC;Flexibility;Pain;Vision;UE functional use;Endurance    Cognitive Skills Attention;Perception;Problem Solve;Safety Awareness;Thought    Rehab Potential Good    Clinical Decision Making Several treatment options, min-mod task modification necessary    Comorbidities Affecting Occupational Performance: None     Modification or Assistance to Complete Evaluation  Min-Moderate modification of tasks or assist with assess necessary to complete eval    OT Frequency 2x / week    OT Duration 12 weeks    OT Treatment/Interventions Self-care/ADL training;Ultrasound;Energy conservation;Visual/perceptual remediation/compensation;Patient/family education;DME and/or AE instruction;Aquatic Therapy;Paraffin;Gait Training;Passive range of motion;Balance training;Fluidtherapy;Electrical Stimulation;Functional Mobility Training;Splinting;Moist Heat;Therapeutic exercise;Manual Therapy;Cognitive remediation/compensation;Manual lymph drainage;Neuromuscular education;Coping strategies training    Consulted and Agree with Plan of Care Patient             Patient will benefit from skilled therapeutic intervention in order to improve the following deficits and impairments:   Body Structure / Function / Physical Skills: ADL, Balance, Coordination, Decreased knowledge of precautions, Decreased knowledge of use of DME, Dexterity, Edema, Mobility, Tone, Strength, IADL, Sensation, GMC, Gait, ROM, FMC, Flexibility, Pain, Vision, UE functional use, Endurance Cognitive Skills: Attention, Perception, Problem Solve, Safety Awareness, Thought     Visit Diagnosis: Other lack of coordination  Muscle weakness (generalized)  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, unspecified laterality (HCC)    Problem List Patient Active Problem List   Diagnosis Date Noted   Dyspareunia in female 04/05/2020   History of abnormal cervical Pap smear 03/10/2020   GIB (gastrointestinal bleeding) 02/28/2020   Elevated BUN    Prediabetes    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   Cerebral vasospasm    Sinus tachycardia    Dysphagia, post-stroke    Thrombocytopenia (HCC)    Acute blood loss anemia    Brain aneurysm    ICH (intracerebral hemorrhage) ([place]) 01/05/2020   History of adenomatous polyp of colon  02/07/2016   Anatomical narrow angle, bilateral 12/14/2014   Fissure in ano 11/11/2014   Hemorrhoids 11/11/2014   Constipation 11/19/2013   GERD (gastroesophageal reflux disease) 03/24/2013   Irritable bowel syndrome with diarrhea 03/24/2013   Herpes zoster 05/14/2005   Abnormal Pap smear of cervix 05/15/1983   History of cervical dysplasia 05/15/1983   Leta Speller, MS, OTR/L  Darleene Cleaver, OT/L 03/03/2021, 1:19 PM   [facility] [address] [place], [place], [address] Phone: [phone]   Fax:  [phone]  Name: Erika Stanton MRN: [id number] Date of Birth: Sep 26, 1955

## 2021-03-06 ENCOUNTER — Ambulatory Visit: Payer: BC Managed Care – PPO | Admitting: Occupational Therapy

## 2021-03-07 ENCOUNTER — Ambulatory Visit: Payer: BC Managed Care – PPO

## 2021-03-07 ENCOUNTER — Other Ambulatory Visit: Payer: Self-pay

## 2021-03-07 DIAGNOSIS — M6281 Muscle weakness (generalized): Secondary | ICD-10-CM

## 2021-03-07 DIAGNOSIS — R482 Apraxia: Secondary | ICD-10-CM

## 2021-03-07 DIAGNOSIS — R278 Other lack of coordination: Secondary | ICD-10-CM

## 2021-03-08 NOTE — Therapy (Signed)
[place] MAIN [facility] SERVICES [address] [place], [place], [address] Phone: [phone]   Fax:  [phone]  Occupational Therapy Recertification  Patient Details  Name: Erika Stanton MRN: [id number] Date of Birth: [birth year] No data recorded  Encounter Date: 03/07/2021   OT End of Session - 03/08/21 0849     Visit Number 100    Number of Visits 124    Date for OT Re-Evaluation 05/29/21    Authorization Type Progress report period starting 01/26/2021    Authorization Time Period State BCBS    OT Start Time 1300    OT Stop Time 1345    OT Time Calculation (min) 45 min    Activity Tolerance Patient tolerated treatment well    Behavior During Therapy [facility] for tasks assessed/performed             Past Medical History:  Diagnosis Date   Aneurysm ([place])    Melena     Past Surgical History:  Procedure Laterality Date   CHOLECYSTECTOMY     COLONOSCOPY  11/2017   at [facility]. no recurrent polyps.  suggest repeat surveillance study 11/2022.     COLONOSCOPY W/ POLYPECTOMY  06/2014   Dr Roxy Manns at [facility].  3 adenomatoous polyps, anal fissure.     ESOPHAGOGASTRODUODENOSCOPY (EGD) WITH PROPOFOL N/A 01/27/2020   Procedure: ESOPHAGOGASTRODUODENOSCOPY (EGD) WITH PROPOFOL;  Surgeon: Mauri Pole, MD;  Location: [facility];  Service: Endoscopy;  Laterality: N/A;   IR 3D INDEPENDENT WKST  01/06/2020   IR ANGIO INTRA EXTRACRAN SEL INTERNAL CAROTID BILAT MOD SED  01/06/2020   IR ANGIO VERTEBRAL SEL VERTEBRAL UNI L MOD SED  01/06/2020   IR ANGIOGRAM FOLLOW UP STUDY  01/06/2020   IR ANGIOGRAM FOLLOW UP STUDY  01/06/2020   IR ANGIOGRAM FOLLOW UP STUDY  01/06/2020   IR ANGIOGRAM FOLLOW UP STUDY  01/06/2020   IR ANGIOGRAM FOLLOW UP STUDY  01/06/2020   IR ANGIOGRAM FOLLOW UP STUDY  01/06/2020   IR ANGIOGRAM FOLLOW UP STUDY  01/06/2020   IR ANGIOGRAM FOLLOW UP STUDY  01/06/2020   IR ANGIOGRAM FOLLOW UP STUDY  01/06/2020   IR ANGIOGRAM FOLLOW UP STUDY   01/06/2020   IR NEURO EACH ADD'L AFTER BASIC UNI RIGHT (MS)  01/06/2020   IR TRANSCATH/EMBOLIZ  01/06/2020   RADIOLOGY WITH ANESTHESIA N/A 01/06/2020   Procedure: IR WITH ANESTHESIA FOR ANEURYSM;  Surgeon: Consuella Lose, MD;  Location: [facility];  Service: Radiology;  Laterality: N/A;   TUBAL LIGATION      There were no vitals filed for this visit.   Subjective Assessment - 03/07/21 0847     Subjective  Pt reports her insurance will be changing at the end of Nov and may have to stop therapy for awhile at that point.    Patient is accompanied by: Family member    Pertinent History ICH 01/06/20, Aneurysm. PMH: OA bilateral knees and hands    Repetition Decreases Symptoms    Patient Stated Goals Pt would like to be as independent as possible.    Currently in Pain? Yes    Pain Score 1     Pain Location Hand    Pain Orientation Right    Pain Descriptors / Indicators Aching;Tightness    Pain Type Chronic pain    Pain Radiating Towards shoulder, hand    Pain Onset More than a month ago    Pain Frequency Intermittent    Aggravating Factors  activity  Pain Relieving Factors rest/heat/gentle stretch    Effect of Pain on Daily Activities decreased activity tolerance when using RUE    Multiple Pain Sites No    Pain Onset More than a month ago             [facility] OT Assessment - 03/08/21 0001       ADL   Upper Body Bathing Minimal assistance    Lower Body Bathing Modified independent    Upper Body Dressing Set up   assist with clothing fasteners   Lower Body Dressing Modified independent   pants, underwear and slip on shoes, does not wear socks.   Medical laboratory scientific officer Modified independent    ADL comments Carries laundry in her arm against her body, able to put detergent in with her left hand, able to put clothes in dryer, assist with putting clothes on rack, unload lighter items from dishwasher, able to set the table,      IADL   Prior Level of  Function Shopping independent    Shopping Needs to be accompanied on any shopping trip    Prior Level of Function Meal Prep independent    Meal Prep Able to complete simple cold meal and snack prep    Medication Management Takes responsibility if medication is prepared in advance in seperate dosage      Coordination   Right 9 Hole Peg Test --   NT d/t time constraints, will attempt next session   Left 9 Hole Peg Test NT d/t time constraints (will attempt next session)      AROM   Right Shoulder Flexion 75 Degrees    Right Shoulder ABduction 60 Degrees    Right Wrist Extension 68 Degrees    Right Wrist Flexion 60 Degrees      Right Hand AROM   R Index  MCP 0-90 -60 Degrees   ext   R Index PIP 0-100 0 Degrees   ext   R Long  MCP 0-90 -55 Degrees   ext   R Long PIP 0-100 0 Degrees   ext   R Ring  MCP 0-90 -45 Degrees   ext   R Ring PIP 0-100 -30 Degrees   ext   R Little  MCP 0-90 -85 Degrees   ext   R Little PIP 0-100 -30 Degrees   ext     Hand Function   Right Hand Grip (lbs) 1    Right Hand Lateral Pinch 2 lbs    Right Hand 3 Point Pinch 2 lbs    Left Hand Grip (lbs) 21    Left Hand Lateral Pinch 9 lbs    Left 3 point pinch 15 lbs              Occupational Therapy Recertification: Manual Therapy: Moist heat applied to R shoulder/forearm/hand x10 min for pain reduction/muscle relaxation with simultaneous passive stretching, working to reduce flexor tone and increase ROM for self care performance.  Focus on R shoulder flexion/abd, R wrist extension, and digit flex/ext with good tolerance.    Therapeutic Activity: Participated in goal review/updates/remeasurements for ROM/strength/coordination for recertification evaluation, see note for details.    Response to treatment: Pt making gains with R shoulder active abduction from 38 to 60 degrees, allowing improved functional reach during self care tasks.  Distal mobility also with noted gains, with improved active wrist  flexion from 45 to 60 and wrist extension from 50 to 68 degrees.  R hand digits 2-4 with noted improvements in MP and PIP extension, continuing to work towards improved active flexion/extension to better enable grasp/release of ADL supplies.  OT visits continue to address distal RUE tone, strength and coordination throughout RUE, pain management, developing normalized movement patterns with reaching and grasp/release.  Pt is increasing use of R hand during self care, using as a stabilizer at least 25% of the time.  Pt will continue to benefit from skilled OT to address above noted UE deficits in order to maximize functional use of RUE for self care tasks.    OT Education - 03/07/21 0849     Education Details goal updates/recert poc    Person(s) Educated Patient    Methods Explanation;Verbal cues;Demonstration    Comprehension Verbalized understanding;Returned demonstration;Tactile cues required;Verbal cues required;Need further instruction              OT Short Term Goals - 03/07/21 1309       OT SHORT TERM GOAL #3   Baseline 03/07/21 assist only with clothing fasterners      OT SHORT TERM GOAL #4   Title Pt will demonstrate 60* A/ROM shoulder flexion for RUE with pain less than or equal to 4/10.    Baseline Pt. presents with limited isolated  right shoulder flexion with pain continues to be 2. 08/08/20: 40* AROM with 3/10 pain; 03/07/21: Pt demos 75* active shoulder flexion with 2/10 pain or less.    Time 4    Period Weeks    Status On-going    Target Date 04/17/21      OT SHORT TERM GOAL #5   Title Pt will demonstrate improved LUE fine motor coordination for ADLs as evidenced by decreasing 9 hole peg test score to 25 secs or less.    Baseline 10/31/2020: Left FMC: 32 sec. Left FMC 41 sec visa the 9 hole peg test. 08/08/20: 32 seconds via 9 hole peg    Time 6    Period Weeks    Status On-going    Target Date 12/05/20               OT Long Term Goals - 03/07/21 1316        OT LONG TERM GOAL #1   Title Pt will perform all basic ADLs with supervision    Baseline 01/24/21 assist with bra 12/13/2020 unchanged 6/202/2022: Pt. is able to donn stretchy shirts, donn slide on shoes, donning pants. Pt. has difficylty with managing a bra,shirt buttons, and pant zipper/button.Pt. requires minA from her husband following multiple recent surgeries. Pt. requires minA from her husband. 08/08/20: Pt continues to require MIN A for grooming and dressing tasks; 03/07/21: pt performs UB/LB bathing and dressing with set up (assist with clothing fasteners); min A for grooming d/t unable to manage hair with 2 hands    Time 12    Period Weeks    Status On-going    Target Date 05/29/21      OT LONG TERM GOAL #2   Title --    Baseline --    Time 12    Period Weeks    Status On-going      OT LONG TERM GOAL #3   Title Pt will use RUE as a stabilizer/ gross A at least 30 % of the time for ADLs/ IADLs.    Baseline Pt. is using her right hand as a gross stabilizer 15% of the time. 08/08/20: requires MAX cues; 03/07/21 Pt using R hand as a  gross stabilizer at least 25% of the time.    Time 12    Period Weeks    Status On-going    Target Date 05/29/21      OT LONG TERM GOAL #4   Title Pt will increase LUE grip strength to 25 lbs or greater for increased ease with ADLs.    Baseline 12/13/20: 24# left, 1# right 10/31/2020: Pt. continues to present with limited left grip. Pt. has improved with left grip strength. Pt.continues to work towards 25#. 08/08/20: L grip 19#; 03/07/21: L grip 21 lbs (R 1 lb)    Time 12    Period Weeks    Status On-going    Target Date 05/29/21      OT LONG TERM GOAL #5   Title 10/31/2020: Pt. is now perfroming laundry, loading the dishwasher, assists some with cooking, light meal preparation, setting the table    Baseline 12/13/2020:  continues to assist as needed but uses left hand .  10/13/20: Pt. assists with filling the dishwasher, and performing laundry. Pt. continues to  try to help when she can, however her husband mostly performs these tasks. 08/08/20: Husband performs; 03/07/21: Pt assists with laundry, loading dishwasher, light cooking/meal prep; mostly using LUE.    Time 12    Period Weeks    Status On-going    Target Date 05/29/21      OT LONG TERM GOAL #6   Title Pt will demonstrate ability to grasp/ release a cup 2/3 trials with RUE.    Baseline 12/13/20:  Continues to work on consistency with grasp and release 10/31/2020: Pt. continues to initiate grasp, and active release of objects, however is unable to grasp/release a cup. 08/08/20: Able to grasp, unable to release x1 trial; 03/05/21: Able to grasp/release cup with min A to keep cup from tipping over.    Time 12    Period Weeks    Status On-going    Target Date 05/29/21      OT LONG TERM GOAL #7   Title Pt will demonstrate the ability to place clothing on hangers with use of bilateral UE and modified technique.    Baseline unable; 03/07/21: min-mod A    Time 12    Period Weeks    Status On-going    Target Date 05/29/21      OT LONG TERM GOAL #8   Title I with updated HEP.    Baseline 12/13/2020:  Continual changes to HEP 09/12/2020: Pt. conitinues to require assist for her HEP. Pt. requires assist, and cuing for HEP. 08/08/20: reports completing with husband assist for technique; 03/07/21: min vc    Time 12    Period Weeks    Status Partially Met    Target Date 05/29/21      OT LONG TERM GOAL  #9   TITLE Pt will demonstrate the ability to don and doff robe after shower with modified independence    Baseline unable; 03/07/21: pt reports she avoids her robe (too heavy); uses towel instead.    Time 12    Period Weeks    Status On-going    Target Date 05/29/21      OT LONG TERM GOAL  #10   TITLE Pt will demonstrate the ability to perform zippers with modified independence and use of adaptive equipment as needed.    Baseline difficulty; 03/07/21: cues to modify zippers with string loops (has not  yet tried)    Time 12    Period Weeks  Status On-going    Target Date 05/29/21                Plan - 03/07/21 1329     Clinical Impression Statement Pt making gains with R shoulder active abduction from 38 to 60 degrees, allowing improved functional reach during self care tasks.  Distal mobility also with noted gains, with improved active wrist flexion from 45 to 60 and wrist extension from 50 to 68 degrees.  R hand digits 2-4 with noted improvements in MP and PIP extension, continuing to work towards improved active flexion/extension to better enable grasp/release of ADL supplies.  OT visits continue to address distal RUE tone, strength and coordination throughout RUE, pain management, developing normalized movement patterns with reaching and grasp/release.  Pt is increasing use of R hand during self care, using as a stabilizer at least 25% of the time.  Pt will continue to benefit from skilled OT to address above noted UE deficits in order to maximize functional use of RUE for self care tasks.    OT Occupational Profile and History Detailed Assessment- Review of Records and additional review of physical, cognitive, psychosocial history related to current functional performance    Occupational performance deficits (Please refer to evaluation for details): ADL's;IADL's;Rest and Sleep;Work;Play;Leisure    Body Structure / Function / Physical Skills ADL;Balance;Coordination;Decreased knowledge of precautions;Decreased knowledge of use of DME;Dexterity;Edema;Mobility;Tone;Strength;IADL;Sensation;GMC;Gait;ROM;FMC;Flexibility;Pain;Vision;UE functional use;Endurance    Cognitive Skills Attention;Perception;Problem Solve;Safety Awareness;Thought    Rehab Potential Good    Clinical Decision Making Several treatment options, min-mod task modification necessary    Comorbidities Affecting Occupational Performance: None    Modification or Assistance to Complete Evaluation  Min-Moderate modification of  tasks or assist with assess necessary to complete eval    OT Frequency 2x / week    OT Duration 12 weeks    OT Treatment/Interventions Self-care/ADL training;Ultrasound;Energy conservation;Visual/perceptual remediation/compensation;Patient/family education;DME and/or AE instruction;Aquatic Therapy;Paraffin;Gait Training;Passive range of motion;Balance training;Fluidtherapy;Electrical Stimulation;Functional Mobility Training;Splinting;Moist Heat;Therapeutic exercise;Manual Therapy;Cognitive remediation/compensation;Manual lymph drainage;Neuromuscular education;Coping strategies training    Consulted and Agree with Plan of Care Patient             Patient will benefit from skilled therapeutic intervention in order to improve the following deficits and impairments:   Body Structure / Function / Physical Skills: ADL, Balance, Coordination, Decreased knowledge of precautions, Decreased knowledge of use of DME, Dexterity, Edema, Mobility, Tone, Strength, IADL, Sensation, GMC, Gait, ROM, FMC, Flexibility, Pain, Vision, UE functional use, Endurance Cognitive Skills: Attention, Perception, Problem Solve, Safety Awareness, Thought     Visit Diagnosis: Apraxia  Muscle weakness (generalized)  Other lack of coordination    Problem List Patient Active Problem List   Diagnosis Date Noted   Dyspareunia in female 04/05/2020   History of abnormal cervical Pap smear 03/10/2020   GIB (gastrointestinal bleeding) 02/28/2020   Elevated BUN    Prediabetes    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   Cerebral vasospasm    Sinus tachycardia    Dysphagia, post-stroke    Thrombocytopenia ([facility])    Acute blood loss anemia    Brain aneurysm    ICH (intracerebral hemorrhage) ([place]) 01/05/2020   History of adenomatous polyp of colon 02/07/2016   Anatomical narrow angle, 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

## 2021-03-09 ENCOUNTER — Ambulatory Visit: Payer: BC Managed Care – PPO

## 2021-03-09 ENCOUNTER — Other Ambulatory Visit: Payer: Self-pay

## 2021-03-09 DIAGNOSIS — M6281 Muscle weakness (generalized): Secondary | ICD-10-CM

## 2021-03-09 DIAGNOSIS — R278 Other lack of coordination: Secondary | ICD-10-CM

## 2021-03-10 NOTE — Therapy (Signed)
[place] MAIN [facility] SERVICES [address] [place], [place], [address] Phone: [phone]   Fax:  [phone]  Occupational Therapy Treatment  Patient Details  Name: Erika Stanton MRN: [id number] Date of Birth: 1955-09-16 No data recorded  Encounter Date: 03/09/2021   OT End of Session - 03/10/21 1744     Visit Number 101    Number of Visits 124    Date for OT Re-Evaluation 05/29/21    Authorization Type Progress report period starting 01/26/2021    Authorization Time Period State BC[employer]    OT Start Time 1300    OT Stop Time 1345    OT Time Calculation (min) 45 min    Activity Tolerance Patient tolerated treatment well    Behavior During Therapy [facility] for tasks assessed/performed             Past Medical History:  Diagnosis Date   Aneurysm ([place])    Melena     Past Surgical History:  Procedure Laterality Date   CHOLECYSTECTOMY     COLONOSCOPY  11/2017   at [facility]. no recurrent polyps.  suggest repeat surveillance study 11/2022.     COLONOSCOPY W/ POLYPECTOMY  06/2014   Dr Roxy Manns at [facility].  3 adenomatoous polyps, anal fissure.     ESOPHAGOGASTRODUODENOSCOPY (EGD) WITH PROPOFOL N/A 01/27/2020   Procedure: ESOPHAGOGASTRODUODENOSCOPY (EGD) WITH PROPOFOL;  Surgeon: Mauri Pole, MD;  Location: [facility];  Service: Endoscopy;  Laterality: N/A;   IR 3D INDEPENDENT WKST  01/06/2020   IR ANGIO INTRA EXTRACRAN SEL INTERNAL CAROTID BILAT MOD SED  01/06/2020   IR ANGIO VERTEBRAL SEL VERTEBRAL UNI L MOD SED  01/06/2020   IR ANGIOGRAM FOLLOW UP STUDY  01/06/2020   IR ANGIOGRAM FOLLOW UP STUDY  01/06/2020   IR ANGIOGRAM FOLLOW UP STUDY  01/06/2020   IR ANGIOGRAM FOLLOW UP STUDY  01/06/2020   IR ANGIOGRAM FOLLOW UP STUDY  01/06/2020   IR ANGIOGRAM FOLLOW UP STUDY  01/06/2020   IR ANGIOGRAM FOLLOW UP STUDY  01/06/2020   IR ANGIOGRAM FOLLOW UP STUDY  01/06/2020   IR ANGIOGRAM FOLLOW UP STUDY  01/06/2020   IR ANGIOGRAM FOLLOW UP STUDY  01/06/2020    IR NEURO EACH ADD'L AFTER BASIC UNI RIGHT (MS)  01/06/2020   IR TRANSCATH/EMBOLIZ  01/06/2020   RADIOLOGY WITH ANESTHESIA N/A 01/06/2020   Procedure: IR WITH ANESTHESIA FOR ANEURYSM;  Surgeon: Consuella Lose, MD;  Location: [place];  Service: Radiology;  Laterality: N/A;   TUBAL LIGATION      There were no vitals filed for this visit.   Subjective Assessment - 03/09/21 1742     Subjective  Pt reports doing well today with no new complaints.    Patient is accompanied by: Family member    Pertinent History ICH 01/06/20, Aneurysm. PMH: OA bilateral knees and hands    Repetition Decreases Symptoms    Patient Stated Goals Pt would like to be as independent as possible.    Currently in Pain? Yes    Pain Score 3     Pain Location Hand    Pain Orientation Right    Pain Descriptors / Indicators Aching;Tightness;Tender    Pain Type Chronic pain    Pain Radiating Towards shoulder, hand    Pain Onset More than a month ago    Pain Frequency Intermittent    Aggravating Factors  activity    Pain Relieving Factors rest/heat/gentle stretch    Effect of Pain on Daily Activities  decreased activity tolerance when using RUE    Multiple Pain Sites No    Pain Onset More than a month ago                [facility] OT Assessment - 03/10/21 0001       Coordination   Right 9 Hole Peg Test 0   attempted but unable.  Able to remove several pegs from holes with multiple attempts and dropping, but unable to place peg in hole.           Occupational Therapy Treatment: Manual Therapy: Moist heat applied to R shoulder/forearm/hand x10 min for pain reduction/muscle relaxation with simultaneous passive stretching to R hand wrist and digit flexors, working to reduce flexor tone and increase ROM for self care performance.  Focus on R shoulder flexion/abd, R wrist extension, and digit flex/ext with good tolerance.    Neuro re-ed: Pt worked to remove jumbo pegs from pegboard using RUE and engaging R thumb, IF,  and LF to form a 3 point pinch; able to perform with 50% accuracy.  Pt performs with at least 75% accuracy when only using IF and thumb.  OT placed pegboard on incline and moved from center, R, L on table top to facilitate R shoulder flex/abd/horiz add, OT providing intermittent tactile cues to depress R shoulder while reaching.  Intermittent passive stretching between reps to reduce flexor tone in R wrist and hand.   Response to Treatment: See Plan/clinical impression below.    OT Education - 03/09/21 1743     Education Details engaging R LF into prehension patterns    Person(s) Educated Patient    Methods Explanation;Verbal cues;Demonstration    Comprehension Verbalized understanding;Returned demonstration;Tactile cues required;Verbal cues required;Need further instruction              OT Short Term Goals - 03/07/21 1309       OT SHORT TERM GOAL #3   Baseline 03/07/21 assist only with clothing fasterners      OT SHORT TERM GOAL #4   Title Pt will demonstrate 60* A/ROM shoulder flexion for RUE with pain less than or equal to 4/10.    Baseline Pt. presents with limited isolated  right shoulder flexion with pain continues to be 2. 08/08/20: 40* AROM with 3/10 pain; 03/07/21: Pt demos 75* active shoulder flexion with 2/10 pain or less.    Time 4    Period Weeks    Status On-going    Target Date 04/17/21      OT SHORT TERM GOAL #5   Title Pt will demonstrate improved LUE fine motor coordination for ADLs as evidenced by decreasing 9 hole peg test score to 25 secs or less.    Baseline 10/31/2020: Left FMC: 32 sec. Left FMC 41 sec visa the 9 hole peg test. 08/08/20: 32 seconds via 9 hole peg    Time 6    Period Weeks    Status On-going    Target Date 12/05/20               OT Long Term Goals - 03/07/21 1316       OT LONG TERM GOAL #1   Title Pt will perform all basic ADLs with supervision    Baseline 01/24/21 assist with bra 12/13/2020 unchanged 6/202/2022: Pt. is able to  donn stretchy shirts, donn slide on shoes, donning pants. Pt. has difficylty with managing a bra,shirt buttons, and pant zipper/button.Pt. requires minA from her husband following multiple recent surgeries. Pt.  requires minA from her husband. 08/08/20: Pt continues to require MIN A for grooming and dressing tasks; 03/07/21: pt performs UB/LB bathing and dressing with set up (assist with clothing fasteners); min A for grooming d/t unable to manage hair with 2 hands    Time 12    Period Weeks    Status On-going    Target Date 05/29/21      OT LONG TERM GOAL #2   Title --    Baseline --    Time 12    Period Weeks    Status On-going      OT LONG TERM GOAL #3   Title Pt will use RUE as a stabilizer/ gross A at least 30 % of the time for ADLs/ IADLs.    Baseline Pt. is using her right hand as a gross stabilizer 15% of the time. 08/08/20: requires MAX cues; 03/07/21 Pt using R hand as a gross stabilizer at least 25% of the time.    Time 12    Period Weeks    Status On-going    Target Date 05/29/21      OT LONG TERM GOAL #4   Title Pt will increase LUE grip strength to 25 lbs or greater for increased ease with ADLs.    Baseline 12/13/20: 24# left, 1# right 10/31/2020: Pt. continues to present with limited left grip. Pt. has improved with left grip strength. Pt.continues to work towards 25#. 08/08/20: L grip 19#; 03/07/21: L grip 21 lbs (R 1 lb)    Time 12    Period Weeks    Status On-going    Target Date 05/29/21      OT LONG TERM GOAL #5   Title 10/31/2020: Pt. is now perfroming laundry, loading the dishwasher, assists some with cooking, light meal preparation, setting the table    Baseline 12/13/2020:  continues to assist as needed but uses left hand .  10/13/20: Pt. assists with filling the dishwasher, and performing laundry. Pt. continues to try to help when she can, however her husband mostly performs these tasks. 08/08/20: Husband performs; 03/07/21: Pt assists with laundry, loading dishwasher,  light cooking/meal prep; mostly using LUE.    Time 12    Period Weeks    Status On-going    Target Date 05/29/21      OT LONG TERM GOAL #6   Title Pt will demonstrate ability to grasp/ release a cup 2/3 trials with RUE.    Baseline 12/13/20:  Continues to work on consistency with grasp and release 10/31/2020: Pt. continues to initiate grasp, and active release of objects, however is unable to grasp/release a cup. 08/08/20: Able to grasp, unable to release x1 trial; 03/05/21: Able to grasp/release cup with min A to keep cup from tipping over.    Time 12    Period Weeks    Status On-going    Target Date 05/29/21      OT LONG TERM GOAL #7   Title Pt will demonstrate the ability to place clothing on hangers with use of bilateral UE and modified technique.    Baseline unable; 03/07/21: min-mod A    Time 12    Period Weeks    Status On-going    Target Date 05/29/21      OT LONG TERM GOAL #8   Title I with updated HEP.    Baseline 12/13/2020:  Continual changes to HEP 09/12/2020: Pt. conitinues to require assist for her HEP. Pt. requires assist, and cuing for HEP. 08/08/20:  reports completing with husband assist for technique; 03/07/21: min vc    Time 12    Period Weeks    Status Partially Met    Target Date 05/29/21      OT LONG TERM GOAL  #9   TITLE Pt will demonstrate the ability to don and doff robe after shower with modified independence    Baseline unable; 03/07/21: pt reports she avoids her robe (too heavy); uses towel instead.    Time 12    Period Weeks    Status On-going    Target Date 05/29/21      OT LONG TERM GOAL  #10   TITLE Pt will demonstrate the ability to perform zippers with modified independence and use of adaptive equipment as needed.    Baseline difficulty; 03/07/21: cues to modify zippers with string loops (has not yet tried)    Time 12    Period Weeks    Status On-going    Target Date 05/29/21              Plan - 03/09/21 1759     Clinical Impression  Statement Pt was able to attempt 9 hole peg test today with R hand.  Pt was able to pick up 1 peg with multiple attempts using R hand, but unable to place into pegboard.  Pt was able to remove 2-3 pegs from pegboard with multiple attempts and dropping of pegs.  Pt continues to develop forward and lateral reaching with RUE to reach for ADL supplies.  Pt typically recognizes when R shoulder is hiking and self corrects slightly, but has difficulty maintaining depressed shoulder while reaching.  Pt able to engage R LF with IF and thumb this day to pick up jumbo pegs from pegboard with 50% accuracy and mod vc for technique.  Encouraged pt continue to work on 2 point and 3 point pinch for small object grasp/release when engaging R hand at home to pick up small ADL supplies.  Flexor tone in R hand and wrist continue to limit RUE efficient prehension.  Skilled OT will continue to address increased functional use of RUE for self care tasks.    OT Occupational Profile and History Detailed Assessment- Review of Records and additional review of physical, cognitive, psychosocial history related to current functional performance    Occupational performance deficits (Please refer to evaluation for details): ADL's;IADL's;Rest and Sleep;Work;Play;Leisure    Body Structure / Function / Physical Skills ADL;Balance;Coordination;Decreased knowledge of precautions;Decreased knowledge of use of DME;Dexterity;Edema;Mobility;Tone;Strength;IADL;Sensation;GMC;Gait;ROM;FMC;Flexibility;Pain;Vision;UE functional use;Endurance    Cognitive Skills Attention;Perception;Problem Solve;Safety Awareness;Thought    Rehab Potential Good    Clinical Decision Making Several treatment options, min-mod task modification necessary    Comorbidities Affecting Occupational Performance: None    Modification or Assistance to Complete Evaluation  Min-Moderate modification of tasks or assist with assess necessary to complete eval    OT Frequency 2x / week     OT Duration 12 weeks    OT Treatment/Interventions Self-care/ADL training;Ultrasound;Energy conservation;Visual/perceptual remediation/compensation;Patient/family education;DME and/or AE instruction;Aquatic Therapy;Paraffin;Gait Training;Passive range of motion;Balance training;Fluidtherapy;Electrical Stimulation;Functional Mobility Training;Splinting;Moist Heat;Therapeutic exercise;Manual Therapy;Cognitive remediation/compensation;Manual lymph drainage;Neuromuscular education;Coping strategies training    Consulted and Agree with Plan of Care Patient             Patient will benefit from skilled therapeutic intervention in order to improve the following deficits and impairments:   Body Structure / Function / Physical Skills: ADL, Balance, Coordination, Decreased knowledge of precautions, Decreased knowledge of use of DME, Dexterity, Edema, Mobility, Tone,  Strength, IADL, Sensation, GMC, Gait, ROM, FMC, Flexibility, Pain, Vision, UE functional use, Endurance Cognitive Skills: Attention, Perception, Problem Solve, Safety Awareness, Thought     Visit Diagnosis: Muscle weakness (generalized)  Other lack of coordination    Problem List Patient Active Problem List   Diagnosis Date Noted   Dyspareunia in female 04/05/2020   History of abnormal cervical Pap smear 03/10/2020   GIB (gastrointestinal bleeding) 02/28/2020   Elevated BUN    Prediabetes    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   Cerebral vasospasm    Sinus tachycardia    Dysphagia, post-stroke    Thrombocytopenia (HCC)    Acute blood loss anemia    Brain aneurysm    ICH (intracerebral hemorrhage) ([place]) 01/05/2020   History of adenomatous polyp of colon 02/07/2016   Anatomical narrow angle, 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

## 2021-03-13 ENCOUNTER — Ambulatory Visit: Payer: BC Managed Care – PPO | Admitting: Occupational Therapy

## 2021-03-14 ENCOUNTER — Other Ambulatory Visit: Payer: Self-pay

## 2021-03-14 ENCOUNTER — Ambulatory Visit: Payer: BC Managed Care – PPO | Attending: Physical Medicine & Rehabilitation | Admitting: Occupational Therapy

## 2021-03-14 DIAGNOSIS — I69159 Hemiplegia and hemiparesis following nontraumatic intracerebral hemorrhage affecting unspecified side: Secondary | ICD-10-CM | POA: Insufficient documentation

## 2021-03-14 DIAGNOSIS — M79641 Pain in right hand: Secondary | ICD-10-CM | POA: Diagnosis present

## 2021-03-14 DIAGNOSIS — M6281 Muscle weakness (generalized): Secondary | ICD-10-CM | POA: Diagnosis present

## 2021-03-14 DIAGNOSIS — R278 Other lack of coordination: Secondary | ICD-10-CM | POA: Insufficient documentation

## 2021-03-14 DIAGNOSIS — M25511 Pain in right shoulder: Secondary | ICD-10-CM | POA: Diagnosis present

## 2021-03-16 ENCOUNTER — Ambulatory Visit: Payer: BC Managed Care – PPO | Admitting: Occupational Therapy

## 2021-03-16 ENCOUNTER — Other Ambulatory Visit: Payer: Self-pay

## 2021-03-16 DIAGNOSIS — M6281 Muscle weakness (generalized): Secondary | ICD-10-CM | POA: Diagnosis not present

## 2021-03-16 DIAGNOSIS — I69159 Hemiplegia and hemiparesis following nontraumatic intracerebral hemorrhage affecting unspecified side: Secondary | ICD-10-CM

## 2021-03-16 DIAGNOSIS — R278 Other lack of coordination: Secondary | ICD-10-CM

## 2021-03-18 ENCOUNTER — Encounter: Payer: Self-pay | Admitting: Occupational Therapy

## 2021-03-18 NOTE — Therapy (Signed)
[place] MAIN [facility] SERVICES [address] [place], [place], [address] Phone: [phone]   Fax:  [phone]  Occupational Therapy Treatment  Patient Details  Name: Erika Stanton MRN: [id number] Date of Birth: [birth year] No data recorded  Encounter Date: 03/16/2021   OT End of Session - 03/18/21 1407     Visit Number 103    Number of Visits 124    Date for OT Re-Evaluation 05/29/21    Authorization Type Progress report period starting 01/26/2021    Authorization Time Period State BCBS    OT Start Time 1306    OT Stop Time 1350    OT Time Calculation (min) 44 min    Activity Tolerance Patient tolerated treatment well    Behavior During Therapy [facility] for tasks assessed/performed             Past Medical History:  Diagnosis Date   Aneurysm ([place])    Melena     Past Surgical History:  Procedure Laterality Date   CHOLECYSTECTOMY     COLONOSCOPY  11/2017   at [facility]. no recurrent polyps.  suggest repeat surveillance study 11/2022.     COLONOSCOPY W/ POLYPECTOMY  06/2014   Dr Roxy Manns at [facility].  3 adenomatoous polyps, anal fissure.     ESOPHAGOGASTRODUODENOSCOPY (EGD) WITH PROPOFOL N/A 01/27/2020   Procedure: ESOPHAGOGASTRODUODENOSCOPY (EGD) WITH PROPOFOL;  Surgeon: Mauri Pole, MD;  Location: [facility];  Service: Endoscopy;  Laterality: N/A;   IR 3D INDEPENDENT WKST  01/06/2020   IR ANGIO INTRA EXTRACRAN SEL INTERNAL CAROTID BILAT MOD SED  01/06/2020   IR ANGIO VERTEBRAL SEL VERTEBRAL UNI L MOD SED  01/06/2020   IR ANGIOGRAM FOLLOW UP STUDY  01/06/2020   IR ANGIOGRAM FOLLOW UP STUDY  01/06/2020   IR ANGIOGRAM FOLLOW UP STUDY  01/06/2020   IR ANGIOGRAM FOLLOW UP STUDY  01/06/2020   IR ANGIOGRAM FOLLOW UP STUDY  01/06/2020   IR ANGIOGRAM FOLLOW UP STUDY  01/06/2020   IR ANGIOGRAM FOLLOW UP STUDY  01/06/2020   IR ANGIOGRAM FOLLOW UP STUDY  01/06/2020   IR ANGIOGRAM FOLLOW UP STUDY  01/06/2020   IR ANGIOGRAM FOLLOW UP STUDY  01/06/2020    IR NEURO EACH ADD'L AFTER BASIC UNI RIGHT (MS)  01/06/2020   IR TRANSCATH/EMBOLIZ  01/06/2020   RADIOLOGY WITH ANESTHESIA N/A 01/06/2020   Procedure: IR WITH ANESTHESIA FOR ANEURYSM;  Surgeon: Consuella Lose, MD;  Location: [place];  Service: Radiology;  Laterality: N/A;   TUBAL LIGATION      There were no vitals filed for this visit.   Subjective Assessment - 03/18/21 1405     Subjective  Pt reports she is still waiting to hear about her retirement paper work and insurance.    Pertinent History ICH 01/06/20, Aneurysm. PMH: OA bilateral knees and hands    Patient Stated Goals Pt would like to be as independent as possible.    Currently in Pain? Yes    Pain Score 3     Pain Location Hand    Pain Orientation Right    Pain Descriptors / Indicators Aching;Tightness    Pain Type Chronic pain    Pain Onset More than a month ago    Pain Frequency Intermittent            Moist heat to right shoulder, forearm and hand for 5 mins prior to therapeutic intervention to decrease pain, decrease flexor tone and increased ROM to prepare arm for active use  in tasks in the clinic.     Therapeutic exercises: Pt seen for PROM of right UE, shoulder flexion to 90 degrees, ABD to 90 degrees, ER with prolonged stretching, elbow flexion/extension, supination pronation, wrist flexion/extension and digit flexion/extension followed by A/AROM of same movements as outlined.  Pt performing tabletop AAROM with towel under RUE, forward flexion, diagonal patterns with assist as needed.    Focused this date on prehension patterns with incorporating right hand middle and index finger onto objects with attempts to pick up items.  Pt's middle finger stays extended while ring and small finger are flexed and has difficulty with managing spasticity for normal prehension patterns to pick up and manipulate small objects.   Response to tx: Pt continues to demonstrate limitations in right hand function with difficulty managing  both sides of the hand.  The ring and small finger remain in flexion while the middle finger and index are extended, pt demonstrates difficulty with control of movement patterns between these 2 sides of the hand.  Continue to work towards goals to improve RUE function for use in daily tasks.                        OT Education - 03/18/21 1407     Education Details prehension patterns    Person(s) Educated Patient    Methods Explanation;Verbal cues;Demonstration    Comprehension Verbalized understanding;Returned demonstration;Tactile cues required;Verbal cues required;Need further instruction              OT Short Term Goals - 03/07/21 1309       OT SHORT TERM GOAL #3   Baseline 03/07/21 assist only with clothing fasterners      OT SHORT TERM GOAL #4   Title Pt will demonstrate 60* A/ROM shoulder flexion for RUE with pain less than or equal to 4/10.    Baseline Pt. presents with limited isolated  right shoulder flexion with pain continues to be 2. 08/08/20: 40* AROM with 3/10 pain; 03/07/21: Pt demos 75* active shoulder flexion with 2/10 pain or less.    Time 4    Period Weeks    Status On-going    Target Date 04/17/21      OT SHORT TERM GOAL #5   Title Pt will demonstrate improved LUE fine motor coordination for ADLs as evidenced by decreasing 9 hole peg test score to 25 secs or less.    Baseline 10/31/2020: Left FMC: 32 sec. Left FMC 41 sec visa the 9 hole peg test. 08/08/20: 32 seconds via 9 hole peg    Time 6    Period Weeks    Status On-going    Target Date 12/05/20               OT Long Term Goals - 03/07/21 1316       OT LONG TERM GOAL #1   Title Pt will perform all basic ADLs with supervision    Baseline 01/24/21 assist with bra 12/13/2020 unchanged 6/202/2022: Pt. is able to donn stretchy shirts, donn slide on shoes, donning pants. Pt. has difficylty with managing a bra,shirt buttons, and pant zipper/button.Pt. requires minA from her husband  following multiple recent surgeries. Pt. requires minA from her husband. 08/08/20: Pt continues to require MIN A for grooming and dressing tasks; 03/07/21: pt performs UB/LB bathing and dressing with set up (assist with clothing fasteners); min A for grooming d/t unable to manage hair with 2 hands    Time 12  Period Weeks    Status On-going    Target Date 05/29/21      OT LONG TERM GOAL #2   Title --    Baseline --    Time 12    Period Weeks    Status On-going      OT LONG TERM GOAL #3   Title Pt will use RUE as a stabilizer/ gross A at least 30 % of the time for ADLs/ IADLs.    Baseline Pt. is using her right hand as a gross stabilizer 15% of the time. 08/08/20: requires MAX cues; 03/07/21 Pt using R hand as a gross stabilizer at least 25% of the time.    Time 12    Period Weeks    Status On-going    Target Date 05/29/21      OT LONG TERM GOAL #4   Title Pt will increase LUE grip strength to 25 lbs or greater for increased ease with ADLs.    Baseline 12/13/20: 24# left, 1# right 10/31/2020: Pt. continues to present with limited left grip. Pt. has improved with left grip strength. Pt.continues to work towards 25#. 08/08/20: L grip 19#; 03/07/21: L grip 21 lbs (R 1 lb)    Time 12    Period Weeks    Status On-going    Target Date 05/29/21      OT LONG TERM GOAL #5   Title 10/31/2020: Pt. is now perfroming laundry, loading the dishwasher, assists some with cooking, light meal preparation, setting the table    Baseline 12/13/2020:  continues to assist as needed but uses left hand .  10/13/20: Pt. assists with filling the dishwasher, and performing laundry. Pt. continues to try to help when she can, however her husband mostly performs these tasks. 08/08/20: Husband performs; 03/07/21: Pt assists with laundry, loading dishwasher, light cooking/meal prep; mostly using LUE.    Time 12    Period Weeks    Status On-going    Target Date 05/29/21      OT LONG TERM GOAL #6   Title Pt will demonstrate  ability to grasp/ release a cup 2/3 trials with RUE.    Baseline 12/13/20:  Continues to work on consistency with grasp and release 10/31/2020: Pt. continues to initiate grasp, and active release of objects, however is unable to grasp/release a cup. 08/08/20: Able to grasp, unable to release x1 trial; 03/05/21: Able to grasp/release cup with min A to keep cup from tipping over.    Time 12    Period Weeks    Status On-going    Target Date 05/29/21      OT LONG TERM GOAL #7   Title Pt will demonstrate the ability to place clothing on hangers with use of bilateral UE and modified technique.    Baseline unable; 03/07/21: min-mod A    Time 12    Period Weeks    Status On-going    Target Date 05/29/21      OT LONG TERM GOAL #8   Title I with updated HEP.    Baseline 12/13/2020:  Continual changes to HEP 09/12/2020: Pt. conitinues to require assist for her HEP. Pt. requires assist, and cuing for HEP. 08/08/20: reports completing with husband assist for technique; 03/07/21: min vc    Time 12    Period Weeks    Status Partially Met    Target Date 05/29/21      OT LONG TERM GOAL  #9   TITLE Pt will demonstrate the ability  to don and doff robe after shower with modified independence    Baseline unable; 03/07/21: pt reports she avoids her robe (too heavy); uses towel instead.    Time 12    Period Weeks    Status On-going    Target Date 05/29/21      OT LONG TERM GOAL  #10   TITLE Pt will demonstrate the ability to perform zippers with modified independence and use of adaptive equipment as needed.    Baseline difficulty; 03/07/21: cues to modify zippers with string loops (has not yet tried)    Time 12    Period Weeks    Status On-going    Target Date 05/29/21                   Plan - 03/18/21 1407     Clinical Impression Statement Pt continues to demonstrate limitations in right hand function with difficulty managing both sides of the hand.  The ring and small finger remain in flexion  while the middle finger and index are extended, pt demonstrates difficulty with control of movement patterns between these 2 sides of the hand.  Continue to work towards goals to improve RUE function for use in daily tasks.    OT Occupational Profile and History Detailed Assessment- Review of Records and additional review of physical, cognitive, psychosocial history related to current functional performance    Occupational performance deficits (Please refer to evaluation for details): ADL's;IADL's;Rest and Sleep;Work;Play;Leisure    Body Structure / Function / Physical Skills ADL;Balance;Coordination;Decreased knowledge of precautions;Decreased knowledge of use of DME;Dexterity;Edema;Mobility;Tone;Strength;IADL;Sensation;GMC;Gait;ROM;FMC;Flexibility;Pain;Vision;UE functional use;Endurance    Cognitive Skills Attention;Perception;Problem Solve;Safety Awareness;Thought    Rehab Potential Good    Clinical Decision Making Several treatment options, min-mod task modification necessary    Comorbidities Affecting Occupational Performance: None    Modification or Assistance to Complete Evaluation  Min-Moderate modification of tasks or assist with assess necessary to complete eval    OT Frequency 2x / week    OT Duration 12 weeks    OT Treatment/Interventions Self-care/ADL training;Ultrasound;Energy conservation;Visual/perceptual remediation/compensation;Patient/family education;DME and/or AE instruction;Aquatic Therapy;Paraffin;Gait Training;Passive range of motion;Balance training;Fluidtherapy;Electrical Stimulation;Functional Mobility Training;Splinting;Moist Heat;Therapeutic exercise;Manual Therapy;Cognitive remediation/compensation;Manual lymph drainage;Neuromuscular education;Coping strategies training    Consulted and Agree with Plan of Care Patient             Patient will benefit from skilled therapeutic intervention in order to improve the following deficits and impairments:   Body Structure /  Function / Physical Skills: ADL, Balance, Coordination, Decreased knowledge of precautions, Decreased knowledge of use of DME, Dexterity, Edema, Mobility, Tone, Strength, IADL, Sensation, GMC, Gait, ROM, FMC, Flexibility, Pain, Vision, UE functional use, Endurance Cognitive Skills: Attention, Perception, Problem Solve, Safety Awareness, Thought     Visit Diagnosis: Muscle weakness (generalized)  Other lack of coordination  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, unspecified laterality (HCC)    Problem List Patient Active Problem List   Diagnosis Date Noted   Dyspareunia in female 04/05/2020   History of abnormal cervical Pap smear 03/10/2020   GIB (gastrointestinal bleeding) 02/28/2020   Elevated BUN    Prediabetes    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   Cerebral vasospasm    Sinus tachycardia    Dysphagia, post-stroke    Thrombocytopenia (HCC)    Acute blood loss anemia    Brain aneurysm    ICH (intracerebral hemorrhage) ([place]) 01/05/2020   History of adenomatous polyp of colon 02/07/2016   Anatomical narrow angle, 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 [place], [place], [address] Phone: [phone]   Fax:  [phone]  Name: Erika Stanton MRN: [id number] Date of Birth: 07/20/1955

## 2021-03-18 NOTE — Therapy (Signed)
[place] MAIN [facility] SERVICES [address] [place], [place], [address] Phone: [phone]   Fax:  [phone]  Occupational Therapy Treatment  Patient Details  Name: Erika Stanton MRN: [id number] Date of Birth: 05-11-1956 No data recorded  Encounter Date: 03/14/2021   OT End of Session - 03/18/21 1203     Visit Number 102    Number of Visits 124    Date for OT Re-Evaluation 05/29/21    Authorization Type Progress report period starting 01/26/2021    Authorization Time Period State [employer]    OT Start Time 1300    OT Stop Time 1345    OT Time Calculation (min) 45 min    Activity Tolerance Patient tolerated treatment well    Behavior During Therapy [facility] for tasks assessed/performed             Past Medical History:  Diagnosis Date   Aneurysm ([place])    Melena     Past Surgical History:  Procedure Laterality Date   CHOLECYSTECTOMY     COLONOSCOPY  11/2017   at [facility]. no recurrent polyps.  suggest repeat surveillance study 11/2022.     COLONOSCOPY W/ POLYPECTOMY  06/2014   Dr Roxy Manns at [facility].  3 adenomatoous polyps, anal fissure.     ESOPHAGOGASTRODUODENOSCOPY (EGD) WITH PROPOFOL N/A 01/27/2020   Procedure: ESOPHAGOGASTRODUODENOSCOPY (EGD) WITH PROPOFOL;  Surgeon: Mauri Pole, MD;  Location: [place] [facility];  Service: Endoscopy;  Laterality: N/A;   IR 3D INDEPENDENT WKST  01/06/2020   IR ANGIO INTRA EXTRACRAN SEL INTERNAL CAROTID BILAT MOD SED  01/06/2020   IR ANGIO VERTEBRAL SEL VERTEBRAL UNI L MOD SED  01/06/2020   IR ANGIOGRAM FOLLOW UP STUDY  01/06/2020   IR ANGIOGRAM FOLLOW UP STUDY  01/06/2020   IR ANGIOGRAM FOLLOW UP STUDY  01/06/2020   IR ANGIOGRAM FOLLOW UP STUDY  01/06/2020   IR ANGIOGRAM FOLLOW UP STUDY  01/06/2020   IR ANGIOGRAM FOLLOW UP STUDY  01/06/2020   IR ANGIOGRAM FOLLOW UP STUDY  01/06/2020   IR ANGIOGRAM FOLLOW UP STUDY  01/06/2020   IR ANGIOGRAM FOLLOW UP STUDY  01/06/2020   IR ANGIOGRAM FOLLOW UP STUDY  01/06/2020    IR NEURO EACH ADD'L AFTER BASIC UNI RIGHT (MS)  01/06/2020   IR TRANSCATH/EMBOLIZ  01/06/2020   RADIOLOGY WITH ANESTHESIA N/A 01/06/2020   Procedure: IR WITH ANESTHESIA FOR ANEURYSM;  Surgeon: Consuella Lose, MD;  Location: [place];  Service: Radiology;  Laterality: N/A;   TUBAL LIGATION      There were no vitals filed for this visit.   Subjective Assessment - 03/18/21 1158     Subjective  Pt reports she and her husband have decided not to move now.  They are planning to redo the flooring in her house and her husband wants her to downsize a lot of her collectibles she has so they will have more space.  Pt is planning to ask her friend this week to help her with this task since she cannot lift or move the storage containers and requires assist to open containers.    Pertinent History ICH 01/06/20, Aneurysm. PMH: OA bilateral knees and hands    Patient Stated Goals Pt would like to be as independent as possible.    Currently in Pain? Yes    Pain Score 3     Pain Location Hand    Pain Orientation Right    Pain Descriptors / Indicators Aching;Tightness    Pain Type  Chronic pain    Pain Onset More than a month ago    Pain Frequency Intermittent            Moist heat to right shoulder, forearm and hand for 5 mins prior to therapeutic intervention to decrease pain, decrease flexor tone and increased ROM to prepare arm for active use in tasks in the clinic.    Therapeutic exercises: Pt seen for PROM of right UE, shoulder flexion to 90 degrees, ABD to 90 degrees, ER with prolonged stretching, elbow flexion/extension, supination pronation, wrist flexion/extension and digit flexion/extension followed by A/AROM of same movements as outlined.  Pt performing tabletop AAROM with towel under RUE, forward flexion, diagonal patterns with assist as needed.    Gross grasp and release with attempts at 1/2 inch objects however pt had difficulty so task was graded to larger 1 inch items.  Difficulty with  manipulation of items but if handed items she was able to grasp and release.    Response to tx: Pt reports she has been trying to work on prehension patterns as outlined in last tx session but has difficulty with trying to get middle finger to work at home with picking up items.  She continues to demonstrate increased tone in right hand with affects her ability to manipulate and hold items for purposeful use in daily tasks.  Continue to work towards decreasing pain, increasing motion and normalizing movement patterns for improved use of right UE as a gross assist and working towards more refined movement patterns.                         OT Education - 03/18/21 1202     Education Details ROM of right UE, reaching tasks, prehension patterns    Person(s) Educated Patient    Methods Explanation;Verbal cues;Demonstration    Comprehension Verbalized understanding;Returned demonstration;Tactile cues required;Verbal cues required;Need further instruction              OT Short Term Goals - 03/07/21 1309       OT SHORT TERM GOAL #3   Baseline 03/07/21 assist only with clothing fasterners      OT SHORT TERM GOAL #4   Title Pt will demonstrate 60* A/ROM shoulder flexion for RUE with pain less than or equal to 4/10.    Baseline Pt. presents with limited isolated  right shoulder flexion with pain continues to be 2. 08/08/20: 40* AROM with 3/10 pain; 03/07/21: Pt demos 75* active shoulder flexion with 2/10 pain or less.    Time 4    Period Weeks    Status On-going    Target Date 04/17/21      OT SHORT TERM GOAL #5   Title Pt will demonstrate improved LUE fine motor coordination for ADLs as evidenced by decreasing 9 hole peg test score to 25 secs or less.    Baseline 10/31/2020: Left FMC: 32 sec. Left FMC 41 sec visa the 9 hole peg test. 08/08/20: 32 seconds via 9 hole peg    Time 6    Period Weeks    Status On-going    Target Date 12/05/20               OT Long Term  Goals - 03/07/21 1316       OT LONG TERM GOAL #1   Title Pt will perform all basic ADLs with supervision    Baseline 01/24/21 assist with bra 12/13/2020 unchanged 6/202/2022: Pt. is able  to donn stretchy shirts, donn slide on shoes, donning pants. Pt. has difficylty with managing a bra,shirt buttons, and pant zipper/button.Pt. requires minA from her husband following multiple recent surgeries. Pt. requires minA from her husband. 08/08/20: Pt continues to require MIN A for grooming and dressing tasks; 03/07/21: pt performs UB/LB bathing and dressing with set up (assist with clothing fasteners); min A for grooming d/t unable to manage hair with 2 hands    Time 12    Period Weeks    Status On-going    Target Date 05/29/21      OT LONG TERM GOAL #2   Title --    Baseline --    Time 12    Period Weeks    Status On-going      OT LONG TERM GOAL #3   Title Pt will use RUE as a stabilizer/ gross A at least 30 % of the time for ADLs/ IADLs.    Baseline Pt. is using her right hand as a gross stabilizer 15% of the time. 08/08/20: requires MAX cues; 03/07/21 Pt using R hand as a gross stabilizer at least 25% of the time.    Time 12    Period Weeks    Status On-going    Target Date 05/29/21      OT LONG TERM GOAL #4   Title Pt will increase LUE grip strength to 25 lbs or greater for increased ease with ADLs.    Baseline 12/13/20: 24# left, 1# right 10/31/2020: Pt. continues to present with limited left grip. Pt. has improved with left grip strength. Pt.continues to work towards 25#. 08/08/20: L grip 19#; 03/07/21: L grip 21 lbs (R 1 lb)    Time 12    Period Weeks    Status On-going    Target Date 05/29/21      OT LONG TERM GOAL #5   Title 10/31/2020: Pt. is now perfroming laundry, loading the dishwasher, assists some with cooking, light meal preparation, setting the table    Baseline 12/13/2020:  continues to assist as needed but uses left hand .  10/13/20: Pt. assists with filling the dishwasher, and  performing laundry. Pt. continues to try to help when she can, however her husband mostly performs these tasks. 08/08/20: Husband performs; 03/07/21: Pt assists with laundry, loading dishwasher, light cooking/meal prep; mostly using LUE.    Time 12    Period Weeks    Status On-going    Target Date 05/29/21      OT LONG TERM GOAL #6   Title Pt will demonstrate ability to grasp/ release a cup 2/3 trials with RUE.    Baseline 12/13/20:  Continues to work on consistency with grasp and release 10/31/2020: Pt. continues to initiate grasp, and active release of objects, however is unable to grasp/release a cup. 08/08/20: Able to grasp, unable to release x1 trial; 03/05/21: Able to grasp/release cup with min A to keep cup from tipping over.    Time 12    Period Weeks    Status On-going    Target Date 05/29/21      OT LONG TERM GOAL #7   Title Pt will demonstrate the ability to place clothing on hangers with use of bilateral UE and modified technique.    Baseline unable; 03/07/21: min-mod A    Time 12    Period Weeks    Status On-going    Target Date 05/29/21      OT LONG TERM GOAL #8   Title  I with updated HEP.    Baseline 12/13/2020:  Continual changes to HEP 09/12/2020: Pt. conitinues to require assist for her HEP. Pt. requires assist, and cuing for HEP. 08/08/20: reports completing with husband assist for technique; 03/07/21: min vc    Time 12    Period Weeks    Status Partially Met    Target Date 05/29/21      OT LONG TERM GOAL  #9   TITLE Pt will demonstrate the ability to don and doff robe after shower with modified independence    Baseline unable; 03/07/21: pt reports she avoids her robe (too heavy); uses towel instead.    Time 12    Period Weeks    Status On-going    Target Date 05/29/21      OT LONG TERM GOAL  #10   TITLE Pt will demonstrate the ability to perform zippers with modified independence and use of adaptive equipment as needed.    Baseline difficulty; 03/07/21: cues to  modify zippers with string loops (has not yet tried)    Time 12    Period Weeks    Status On-going    Target Date 05/29/21                   Plan - 03/18/21 1204     Clinical Impression Statement Pt reports she has been trying to work on prehension patterns as outlined in last tx session but has difficulty with trying to get middle finger to work at home with picking up items.  She continues to demonstrate increased tone in right hand with affects her ability to manipulate and hold items for purposeful use in daily tasks.  Continue to work towards decreasing pain, increasing motion and normalizing movement patterns for improved use of right UE as a gross assist and working towards more refined movement patterns.    OT Occupational Profile and History Detailed Assessment- Review of Records and additional review of physical, cognitive, psychosocial history related to current functional performance    Occupational performance deficits (Please refer to evaluation for details): ADL's;IADL's;Rest and Sleep;Work;Play;Leisure    Body Structure / Function / Physical Skills ADL;Balance;Coordination;Decreased knowledge of precautions;Decreased knowledge of use of DME;Dexterity;Edema;Mobility;Tone;Strength;IADL;Sensation;GMC;Gait;ROM;FMC;Flexibility;Pain;Vision;UE functional use;Endurance    Cognitive Skills Attention;Perception;Problem Solve;Safety Awareness;Thought    Rehab Potential Good    Clinical Decision Making Several treatment options, min-mod task modification necessary    Comorbidities Affecting Occupational Performance: None    Modification or Assistance to Complete Evaluation  Min-Moderate modification of tasks or assist with assess necessary to complete eval    OT Frequency 2x / week    OT Duration 12 weeks    OT Treatment/Interventions Self-care/ADL training;Ultrasound;Energy conservation;Visual/perceptual remediation/compensation;Patient/family education;DME and/or AE  instruction;Aquatic Therapy;Paraffin;Gait Training;Passive range of motion;Balance training;Fluidtherapy;Electrical Stimulation;Functional Mobility Training;Splinting;Moist Heat;Therapeutic exercise;Manual Therapy;Cognitive remediation/compensation;Manual lymph drainage;Neuromuscular education;Coping strategies training    Consulted and Agree with Plan of Care Patient             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, unspecified laterality ([place])  Acute pain of right shoulder  Pain in right hand    Problem List Patient Active Problem List   Diagnosis Date Noted   Dyspareunia in female 04/05/2020   History of abnormal cervical Pap smear 03/10/2020   GIB (gastrointestinal bleeding) 02/28/2020   Elevated BUN    Prediabetes    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   Cerebral vasospasm    Sinus tachycardia    Dysphagia, post-stroke    Thrombocytopenia ([facility])    Acute blood loss anemia    Brain aneurysm    ICH (intracerebral hemorrhage) ([place]) 01/05/2020   History of adenomatous polyp of colon 02/07/2016   Anatomical narrow angle, bilateral 12/14/2014   Fissure in ano 11/11/2014   Hemorrhoids 11/11/2014   Constipation 11/19/2013   GERD (gastroesophageal reflux disease) 03/24/2013   Irritable bowel syndrome with diarrhea 03/24/2013   Herpes zoster 05/14/2005   Abnormal Pap smear of  cervix 05/15/1983   History of cervical dysplasia 05/15/1983   Naimah Yingst T Tomasita Morrow, OTR/L, CLT  Orby Tangen, OT/L 03/18/2021, 12:16 PM  [place] MAIN [facility] SERVICES [address] [place], [place], [address] Phone: [phone]   Fax:  [phone]  Name: Erika Stanton MRN: [id number] Date of Birth: 04-27-1956

## 2021-03-20 ENCOUNTER — Ambulatory Visit: Payer: BC Managed Care – PPO | Admitting: Occupational Therapy

## 2021-03-21 ENCOUNTER — Ambulatory Visit: Payer: BC Managed Care – PPO | Admitting: Occupational Therapy

## 2021-03-23 ENCOUNTER — Other Ambulatory Visit: Payer: Self-pay

## 2021-03-23 ENCOUNTER — Encounter: Payer: Self-pay | Admitting: Occupational Therapy

## 2021-03-23 ENCOUNTER — Ambulatory Visit: Payer: BC Managed Care – PPO | Admitting: Occupational Therapy

## 2021-03-23 DIAGNOSIS — R278 Other lack of coordination: Secondary | ICD-10-CM

## 2021-03-23 DIAGNOSIS — M6281 Muscle weakness (generalized): Secondary | ICD-10-CM | POA: Diagnosis not present

## 2021-03-23 NOTE — Therapy (Addendum)
[place] MAIN [facility] SERVICES [address] [place], [place], [address] Phone: [phone]   Fax:  [phone]  Occupational Therapy Treatment  Patient Details  Name: Erika Stanton MRN: [id number] Date of Birth: August 08, 1955 No data recorded  Encounter Date: 03/23/2021   OT End of Session - 03/23/21 1731     Visit Number 104    Number of Visits 148    Date for OT Re-Evaluation 05/29/21    Authorization Type Progress report period starting 01/26/2021    OT Start Time 1300    OT Stop Time 1345    OT Time Calculation (min) 45 min    Activity Tolerance Patient tolerated treatment well    Behavior During Therapy [facility] for tasks assessed/performed             Past Medical History:  Diagnosis Date   Aneurysm ([place])    Melena     Past Surgical History:  Procedure Laterality Date   CHOLECYSTECTOMY     COLONOSCOPY  11/2017   at [facility]. no recurrent polyps.  suggest repeat surveillance study 11/2022.     COLONOSCOPY W/ POLYPECTOMY  06/2014   Dr Roxy Manns at [facility].  3 adenomatoous polyps, anal fissure.     ESOPHAGOGASTRODUODENOSCOPY (EGD) WITH PROPOFOL N/A 01/27/2020   Procedure: ESOPHAGOGASTRODUODENOSCOPY (EGD) WITH PROPOFOL;  Surgeon: Mauri Pole, MD;  Location: [place] [facility];  Service: Endoscopy;  Laterality: N/A;   IR 3D INDEPENDENT WKST  01/06/2020   IR ANGIO INTRA EXTRACRAN SEL INTERNAL CAROTID BILAT MOD SED  01/06/2020   IR ANGIO VERTEBRAL SEL VERTEBRAL UNI L MOD SED  01/06/2020   IR ANGIOGRAM FOLLOW UP STUDY  01/06/2020   IR ANGIOGRAM FOLLOW UP STUDY  01/06/2020   IR ANGIOGRAM FOLLOW UP STUDY  01/06/2020   IR ANGIOGRAM FOLLOW UP STUDY  01/06/2020   IR ANGIOGRAM FOLLOW UP STUDY  01/06/2020   IR ANGIOGRAM FOLLOW UP STUDY  01/06/2020   IR ANGIOGRAM FOLLOW UP STUDY  01/06/2020   IR ANGIOGRAM FOLLOW UP STUDY  01/06/2020   IR ANGIOGRAM FOLLOW UP STUDY  01/06/2020   IR ANGIOGRAM FOLLOW UP STUDY  01/06/2020   IR NEURO EACH ADD'L AFTER BASIC UNI  RIGHT (MS)  01/06/2020   IR TRANSCATH/EMBOLIZ  01/06/2020   RADIOLOGY WITH ANESTHESIA N/A 01/06/2020   Procedure: IR WITH ANESTHESIA FOR ANEURYSM;  Surgeon: Consuella Lose, MD;  Location: [place];  Service: Radiology;  Laterality: N/A;   TUBAL LIGATION      There were no vitals filed for this visit.   Subjective Assessment - 03/23/21 1306     Subjective  Pt reports she is still waiting to hear about her retirement paper work and insurance.    Patient is accompanied by: Family member    Pertinent History ICH 01/06/20, Aneurysm. PMH: OA bilateral knees and hands    Patient Stated Goals Pt would like to be as independent as possible.    Currently in Pain? Yes    Pain Score 3     Pain Location Hand    Pain Orientation Right    Pain Descriptors / Indicators Sore    Pain Type Chronic pain    Pain Onset More than a month ago            OT TREATMENT     Therapeutic Exercise:   Pt. was able to tolerate AROM/PROM for shoulder flexion, abduction, elbow flexion, elbow extension, AAROM/PROM for right wrist, digit MP, PIP, and DIP flexion, and  extension, AROM digit MP, PIP, and DIP flexion, extension.Thumb radial, and palmar abduction. Emphasis was placed on right 4th, and 5th digit MP, PIP, and DIP extension. Pt. worked on [employer], and reciprocal motion using the UBE while seated for 5 min. with no resistance. Constant monitoring was provided.   Neuromuscular re-ed:   Pt. worked on  using her right hand for grasping 1", 3/4", and 1/2" washers from a magnetic dish. Pt. Worked on reaching up to place them onto a horizontal dowel.   Pt. Alternated weight bearing with a flat open hand at the tabletop at times between reps.   Pt. reports that she continues to wait for her retirement paperwork to be completed. Pt. Reports anticipating an insurance change for the new year. Pt. continues to improve with ROM in the right shoulder for flexion, and abduction with reaching, as well as 4th, and  5th digit ROM. Pt. Is tolerating ROM in her hand well. Pt. continues to present with tightness in the right 4th, and 5th digits. Pt. continues to work on normalizing tone in the right hand, and improving ROM in preparation for grasping, and handling objects, as well as reaching up to place them a various angles during ADLs, and IADL tasks.                         OT Education - 03/23/21 1731     Education Details Prehension patterns    Person(s) Educated Patient    Methods Explanation;Verbal cues;Demonstration    Comprehension Verbalized understanding;Returned demonstration;Tactile cues required;Verbal cues required;Need further instruction              OT Short Term Goals - 03/07/21 1309       OT SHORT TERM GOAL #3   Baseline 03/07/21 assist only with clothing fasterners      OT SHORT TERM GOAL #4   Title Pt will demonstrate 60* A/ROM shoulder flexion for RUE with pain less than or equal to 4/10.    Baseline Pt. presents with limited isolated  right shoulder flexion with pain continues to be 2. 08/08/20: 40* AROM with 3/10 pain; 03/07/21: Pt demos 75* active shoulder flexion with 2/10 pain or less.    Time 4    Period Weeks    Status On-going    Target Date 04/17/21      OT SHORT TERM GOAL #5   Title Pt will demonstrate improved LUE fine motor coordination for ADLs as evidenced by decreasing 9 hole peg test score to 25 secs or less.    Baseline 10/31/2020: Left FMC: 32 sec. Left FMC 41 sec visa the 9 hole peg test. 08/08/20: 32 seconds via 9 hole peg    Time 6    Period Weeks    Status On-going    Target Date 12/05/20               OT Long Term Goals - 03/07/21 1316       OT LONG TERM GOAL #1   Title Pt will perform all basic ADLs with supervision    Baseline 01/24/21 assist with bra 12/13/2020 unchanged 6/202/2022: Pt. is able to donn stretchy shirts, donn slide on shoes, donning pants. Pt. has difficylty with managing a bra,shirt buttons, and pant  zipper/button.Pt. requires minA from her husband following multiple recent surgeries. Pt. requires minA from her husband. 08/08/20: Pt continues to require MIN A for grooming and dressing tasks; 03/07/21: pt performs UB/LB bathing and  dressing with set up (assist with clothing fasteners); min A for grooming d/t unable to manage hair with 2 hands    Time 12    Period Weeks    Status On-going    Target Date 05/29/21      OT LONG TERM GOAL #2   Title --    Baseline --    Time 12    Period Weeks    Status On-going      OT LONG TERM GOAL #3   Title Pt will use RUE as a stabilizer/ gross A at least 30 % of the time for ADLs/ IADLs.    Baseline Pt. is using her right hand as a gross stabilizer 15% of the time. 08/08/20: requires MAX cues; 03/07/21 Pt using R hand as a gross stabilizer at least 25% of the time.    Time 12    Period Weeks    Status On-going    Target Date 05/29/21      OT LONG TERM GOAL #4   Title Pt will increase LUE grip strength to 25 lbs or greater for increased ease with ADLs.    Baseline 12/13/20: 24# left, 1# right 10/31/2020: Pt. continues to present with limited left grip. Pt. has improved with left grip strength. Pt.continues to work towards 25#. 08/08/20: L grip 19#; 03/07/21: L grip 21 lbs (R 1 lb)    Time 12    Period Weeks    Status On-going    Target Date 05/29/21      OT LONG TERM GOAL #5   Title 10/31/2020: Pt. is now perfroming laundry, loading the dishwasher, assists some with cooking, light meal preparation, setting the table    Baseline 12/13/2020:  continues to assist as needed but uses left hand .  10/13/20: Pt. assists with filling the dishwasher, and performing laundry. Pt. continues to try to help when she can, however her husband mostly performs these tasks. 08/08/20: Husband performs; 03/07/21: Pt assists with laundry, loading dishwasher, light cooking/meal prep; mostly using LUE.    Time 12    Period Weeks    Status On-going    Target Date 05/29/21       OT LONG TERM GOAL #6   Title Pt will demonstrate ability to grasp/ release a cup 2/3 trials with RUE.    Baseline 12/13/20:  Continues to work on consistency with grasp and release 10/31/2020: Pt. continues to initiate grasp, and active release of objects, however is unable to grasp/release a cup. 08/08/20: Able to grasp, unable to release x1 trial; 03/05/21: Able to grasp/release cup with min A to keep cup from tipping over.    Time 12    Period Weeks    Status On-going    Target Date 05/29/21      OT LONG TERM GOAL #7   Title Pt will demonstrate the ability to place clothing on hangers with use of bilateral UE and modified technique.    Baseline unable; 03/07/21: min-mod A    Time 12    Period Weeks    Status On-going    Target Date 05/29/21      OT LONG TERM GOAL #8   Title I with updated HEP.    Baseline 12/13/2020:  Continual changes to HEP 09/12/2020: Pt. conitinues to require assist for her HEP. Pt. requires assist, and cuing for HEP. 08/08/20: reports completing with husband assist for technique; 03/07/21: min vc    Time 12    Period Weeks  Status Partially Met    Target Date 05/29/21      OT LONG TERM GOAL  #9   TITLE Pt will demonstrate the ability to don and doff robe after shower with modified independence    Baseline unable; 03/07/21: pt reports she avoids her robe (too heavy); uses towel instead.    Time 12    Period Weeks    Status On-going    Target Date 05/29/21      OT LONG TERM GOAL  #10   TITLE Pt will demonstrate the ability to perform zippers with modified independence and use of adaptive equipment as needed.    Baseline difficulty; 03/07/21: cues to modify zippers with string loops (has not yet tried)    Time 12    Period Weeks    Status On-going    Target Date 05/29/21                   Plan - 03/23/21 1732     Clinical Impression Statement Pt. reports that she continues to wait for her retirement paperwork to be completed. Pt. Reports  anticipating an insurance change for the new year. Pt. continues to improve with ROM in the right shoulder for flexion, and abduction with reaching, as well as 4th, and 5th digit ROM. Pt. Is tolerating ROM in her hand well. Pt. continues to present with tightness in the right 4th, and 5th digits. Pt. continues to work on normalizing tone in the right hand, and improving ROM in preparation for grasping, and handling objects, as well as reaching up to place them a various angles during ADLs, and IADL tasks.   OT Occupational Profile and History Detailed Assessment- Review of Records and additional review of physical, cognitive, psychosocial history related to current functional performance    Occupational performance deficits (Please refer to evaluation for details): ADL's;IADL's;Rest and Sleep;Work;Play;Leisure    Body Structure / Function / Physical Skills ADL;Balance;Coordination;Decreased knowledge of precautions;Decreased knowledge of use of DME;Dexterity;Edema;Mobility;Tone;Strength;IADL;Sensation;GMC;Gait;ROM;FMC;Flexibility;Pain;Vision;UE functional use;Endurance    Cognitive Skills Attention;Perception;Problem Solve;Safety Awareness;Thought    Rehab Potential Good    Clinical Decision Making Several treatment options, min-mod task modification necessary    Comorbidities Affecting Occupational Performance: None    Modification or Assistance to Complete Evaluation  Min-Moderate modification of tasks or assist with assess necessary to complete eval    OT Frequency 2x / week    OT Duration 12 weeks    OT Treatment/Interventions Self-care/ADL training;Ultrasound;Energy conservation;Visual/perceptual remediation/compensation;Patient/family education;DME and/or AE instruction;Aquatic Therapy;Paraffin;Gait Training;Passive range of motion;Balance training;Fluidtherapy;Electrical Stimulation;Functional Mobility Training;Splinting;Moist Heat;Therapeutic exercise;Manual Therapy;Cognitive  remediation/compensation;Manual lymph drainage;Neuromuscular education;Coping strategies training    Plan Pt is transferring her care to [facility] as it is closer to home. She remains very fearful of moving RUE and responds well to heat, and estim.    Consulted and Agree with Plan of Care Patient             Patient will benefit from skilled therapeutic intervention in order to improve the following deficits and impairments:   Body Structure / Function / Physical Skills: ADL, Balance, Coordination, Decreased knowledge of precautions, Decreased knowledge of use of DME, Dexterity, Edema, Mobility, Tone, Strength, IADL, Sensation, GMC, Gait, ROM, FMC, Flexibility, Pain, Vision, UE functional use, Endurance Cognitive Skills: Attention, Perception, Problem Solve, Safety Awareness, Thought     Visit Diagnosis: Muscle weakness (generalized)  Other lack of coordination    Problem List Patient Active Problem List   Diagnosis Date Noted   Dyspareunia in female  04/05/2020   History of abnormal cervical Pap smear 03/10/2020   GIB (gastrointestinal bleeding) 02/28/2020   Elevated BUN    Prediabetes    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   Cerebral vasospasm    Sinus tachycardia    Dysphagia, post-stroke    Thrombocytopenia ([facility])    Acute blood loss anemia    Brain aneurysm    ICH (intracerebral hemorrhage) ([place]) 01/05/2020   History of adenomatous polyp of colon 02/07/2016   Anatomical narrow angle, bilateral 12/14/2014   Fissure in ano 11/11/2014   Hemorrhoids 11/11/2014   Constipation 11/19/2013   GERD (gastroesophageal reflux disease) 03/24/2013   Irritable bowel syndrome with diarrhea 03/24/2013   Herpes zoster 05/14/2005   Abnormal Pap smear of cervix 05/15/1983   History of cervical dysplasia 05/15/1983    Harrel Carina, MS, OTR/L 03/23/2021, 6:05 PM  [place] MAIN [facility] SERVICES [address] [place], [place],  [address] Phone: [phone]   Fax:  [phone]  Name: Erika Stanton MRN: [id number] Date of Birth: Aug 13, 1955

## 2021-03-27 ENCOUNTER — Other Ambulatory Visit: Payer: Self-pay

## 2021-03-27 ENCOUNTER — Ambulatory Visit: Payer: BC Managed Care – PPO | Admitting: Occupational Therapy

## 2021-03-28 ENCOUNTER — Other Ambulatory Visit: Payer: Self-pay

## 2021-03-28 ENCOUNTER — Ambulatory Visit: Payer: BC Managed Care – PPO | Admitting: Occupational Therapy

## 2021-03-28 ENCOUNTER — Encounter: Payer: Self-pay | Admitting: Occupational Therapy

## 2021-03-28 DIAGNOSIS — M6281 Muscle weakness (generalized): Secondary | ICD-10-CM | POA: Diagnosis not present

## 2021-03-28 DIAGNOSIS — R278 Other lack of coordination: Secondary | ICD-10-CM

## 2021-03-28 NOTE — Therapy (Addendum)
[place] MAIN [facility] SERVICES [address] [place], [place], [address] Phone: [phone]   Fax:  [phone]  Occupational Therapy Treatment  Patient Details  Name: Erika Stanton MRN: [id number] Date of Birth: [birth year] No data recorded  Encounter Date: 03/28/2021   OT End of Session - 03/28/21 1618     Visit Number 105    Number of Visits 148    Date for OT Re-Evaluation 05/29/21    Authorization Type Progress report period starting 01/26/2021    OT Start Time 1430    OT Stop Time 1515    OT Time Calculation (min) 45 min    Activity Tolerance Patient tolerated treatment well    Behavior During Therapy [facility] for tasks assessed/performed             Past Medical History:  Diagnosis Date   Aneurysm ([place])    Melena     Past Surgical History:  Procedure Laterality Date   CHOLECYSTECTOMY     COLONOSCOPY  11/2017   at [facility]. no recurrent polyps.  suggest repeat surveillance study 11/2022.     COLONOSCOPY W/ POLYPECTOMY  06/2014   Dr Roxy Manns at [facility].  3 adenomatoous polyps, anal fissure.     ESOPHAGOGASTRODUODENOSCOPY (EGD) WITH PROPOFOL N/A 01/27/2020   Procedure: ESOPHAGOGASTRODUODENOSCOPY (EGD) WITH PROPOFOL;  Surgeon: Mauri Pole, MD;  Location: [place] [facility];  Service: Endoscopy;  Laterality: N/A;   IR 3D INDEPENDENT WKST  01/06/2020   IR ANGIO INTRA EXTRACRAN SEL INTERNAL CAROTID BILAT MOD SED  01/06/2020   IR ANGIO VERTEBRAL SEL VERTEBRAL UNI L MOD SED  01/06/2020   IR ANGIOGRAM FOLLOW UP STUDY  01/06/2020   IR ANGIOGRAM FOLLOW UP STUDY  01/06/2020   IR ANGIOGRAM FOLLOW UP STUDY  01/06/2020   IR ANGIOGRAM FOLLOW UP STUDY  01/06/2020   IR ANGIOGRAM FOLLOW UP STUDY  01/06/2020   IR ANGIOGRAM FOLLOW UP STUDY  01/06/2020   IR ANGIOGRAM FOLLOW UP STUDY  01/06/2020   IR ANGIOGRAM FOLLOW UP STUDY  01/06/2020   IR ANGIOGRAM FOLLOW UP STUDY  01/06/2020   IR ANGIOGRAM FOLLOW UP STUDY  01/06/2020   IR NEURO EACH ADD'L AFTER BASIC UNI  RIGHT (MS)  01/06/2020   IR TRANSCATH/EMBOLIZ  01/06/2020   RADIOLOGY WITH ANESTHESIA N/A 01/06/2020   Procedure: IR WITH ANESTHESIA FOR ANEURYSM;  Surgeon: Consuella Lose, MD;  Location: [place];  Service: Radiology;  Laterality: N/A;   TUBAL LIGATION      There were no vitals filed for this visit.   Subjective Assessment - 03/28/21 1615     Subjective  Pt. is looking forward to her trip to [place] at the beginning of December.    Patient is accompanied by: Family member    Repetition Decreases Symptoms    Patient Stated Goals Pt would like to be as independent as possible.    Currently in Pain? No/denies            OT TREATMENT     Therapeutic Exercise:   Pt. was able to tolerate AROM/PROM for shoulder flexion, abduction, elbow flexion, elbow extension, AAROM/PROM for right wrist, digit MP, PIP, and DIP flexion, and extension, AROM digit MP, PIP, and DIP flexion, extension.Thumb radial, and palmar abduction. Emphasis was placed on right 4th, and 5th digit MP, PIP, and DIP extension.    Neuromuscular re-ed:   Pt. worked on  using her right hand for grasping 1", 3/4", and 1/2" washers from a magnetic dish,  and placing them onto a horizontal dowel held in her left hand. Pt. worked on Psychologist, sport and exercise by sliding them off the edge of the table, and working towards stacking them.   Pt. continues to improve with ROM in the right shoulder for flexion, and abduction with reaching, as well as 4th, and 5th digit ROM. Pt. Is tolerating ROM in her hand well. Pt. presents with less tightness in her 4th digit, however continues to present with flexor tightness in the right 4th, and 5th MPs.  Pt. Was able to slide the checkers off the edge of the table, however required the base of the checker pile to be stabilized when stacking them. Pt. continues to work on normalizing tone in the right hand, and improving ROM in preparation for grasping, and handling objects, as well as reaching up to  place them at various angles during ADLs, and IADL tasks.                      OT Education - 03/28/21 1618     Education Details RUE grasp patterns    Person(s) Educated Patient    Methods Explanation;Verbal cues;Demonstration    Comprehension Verbalized understanding;Returned demonstration;Tactile cues required;Verbal cues required;Need further instruction              OT Short Term Goals - 03/07/21 1309       OT SHORT TERM GOAL #3   Baseline 03/07/21 assist only with clothing fasterners      OT SHORT TERM GOAL #4   Title Pt will demonstrate 60* A/ROM shoulder flexion for RUE with pain less than or equal to 4/10.    Baseline Pt. presents with limited isolated  right shoulder flexion with pain continues to be 2. 08/08/20: 40* AROM with 3/10 pain; 03/07/21: Pt demos 75* active shoulder flexion with 2/10 pain or less.    Time 4    Period Weeks    Status On-going    Target Date 04/17/21      OT SHORT TERM GOAL #5   Title Pt will demonstrate improved LUE fine motor coordination for ADLs as evidenced by decreasing 9 hole peg test score to 25 secs or less.    Baseline 10/31/2020: Left FMC: 32 sec. Left FMC 41 sec visa the 9 hole peg test. 08/08/20: 32 seconds via 9 hole peg    Time 6    Period Weeks    Status On-going    Target Date 12/05/20               OT Long Term Goals - 03/07/21 1316       OT LONG TERM GOAL #1   Title Pt will perform all basic ADLs with supervision    Baseline 01/24/21 assist with bra 12/13/2020 unchanged 6/202/2022: Pt. is able to donn stretchy shirts, donn slide on shoes, donning pants. Pt. has difficylty with managing a bra,shirt buttons, and pant zipper/button.Pt. requires minA from her husband following multiple recent surgeries. Pt. requires minA from her husband. 08/08/20: Pt continues to require MIN A for grooming and dressing tasks; 03/07/21: pt performs UB/LB bathing and dressing with set up (assist with clothing fasteners); min  A for grooming d/t unable to manage hair with 2 hands    Time 12    Period Weeks    Status On-going    Target Date 05/29/21      OT LONG TERM GOAL #2   Title --    Baseline --  Time 12    Period Weeks    Status On-going      OT LONG TERM GOAL #3   Title Pt will use RUE as a stabilizer/ gross A at least 30 % of the time for ADLs/ IADLs.    Baseline Pt. is using her right hand as a gross stabilizer 15% of the time. 08/08/20: requires MAX cues; 03/07/21 Pt using R hand as a gross stabilizer at least 25% of the time.    Time 12    Period Weeks    Status On-going    Target Date 05/29/21      OT LONG TERM GOAL #4   Title Pt will increase LUE grip strength to 25 lbs or greater for increased ease with ADLs.    Baseline 12/13/20: 24# left, 1# right 10/31/2020: Pt. continues to present with limited left grip. Pt. has improved with left grip strength. Pt.continues to work towards 25#. 08/08/20: L grip 19#; 03/07/21: L grip 21 lbs (R 1 lb)    Time 12    Period Weeks    Status On-going    Target Date 05/29/21      OT LONG TERM GOAL #5   Title 10/31/2020: Pt. is now perfroming laundry, loading the dishwasher, assists some with cooking, light meal preparation, setting the table    Baseline 12/13/2020:  continues to assist as needed but uses left hand .  10/13/20: Pt. assists with filling the dishwasher, and performing laundry. Pt. continues to try to help when she can, however her husband mostly performs these tasks. 08/08/20: Husband performs; 03/07/21: Pt assists with laundry, loading dishwasher, light cooking/meal prep; mostly using LUE.    Time 12    Period Weeks    Status On-going    Target Date 05/29/21      OT LONG TERM GOAL #6   Title Pt will demonstrate ability to grasp/ release a cup 2/3 trials with RUE.    Baseline 12/13/20:  Continues to work on consistency with grasp and release 10/31/2020: Pt. continues to initiate grasp, and active release of objects, however is unable to grasp/release a  cup. 08/08/20: Able to grasp, unable to release x1 trial; 03/05/21: Able to grasp/release cup with min A to keep cup from tipping over.    Time 12    Period Weeks    Status On-going    Target Date 05/29/21      OT LONG TERM GOAL #7   Title Pt will demonstrate the ability to place clothing on hangers with use of bilateral UE and modified technique.    Baseline unable; 03/07/21: min-mod A    Time 12    Period Weeks    Status On-going    Target Date 05/29/21      OT LONG TERM GOAL #8   Title I with updated HEP.    Baseline 12/13/2020:  Continual changes to HEP 09/12/2020: Pt. conitinues to require assist for her HEP. Pt. requires assist, and cuing for HEP. 08/08/20: reports completing with husband assist for technique; 03/07/21: min vc    Time 12    Period Weeks    Status Partially Met    Target Date 05/29/21      OT LONG TERM GOAL  #9   TITLE Pt will demonstrate the ability to don and doff robe after shower with modified independence    Baseline unable; 03/07/21: pt reports she avoids her robe (too heavy); uses towel instead.    Time 12  Period Weeks    Status On-going    Target Date 05/29/21      OT LONG TERM GOAL  #10   TITLE Pt will demonstrate the ability to perform zippers with modified independence and use of adaptive equipment as needed.    Baseline difficulty; 03/07/21: cues to modify zippers with string loops (has not yet tried)    Time 12    Period Weeks    Status On-going    Target Date 05/29/21                   Plan - 03/28/21 1619     Clinical Impression Statement Pt. continues to improve with ROM in the right shoulder for flexion, and abduction with reaching, as well as 4th, and 5th digit ROM. Pt. Is tolerating ROM in her hand well. Pt. presents with less tightness in her 4th digit, however continues to present with flexor tightness in the right 4th, and 5th MPs.  Pt. Was able to slide the checkers off the edge of the table, however required the base of  the checker pile to be stabilized when stacking them. Pt. continues to work on normalizing tone in the right hand, and improving ROM in preparation for grasping, and handling objects, as well as reaching up to place them at various angles during ADLs, and IADL tasks.   OT Occupational Profile and History Detailed Assessment- Review of Records and additional review of physical, cognitive, psychosocial history related to current functional performance    Occupational performance deficits (Please refer to evaluation for details): ADL's;IADL's;Rest and Sleep;Work;Play;Leisure    Body Structure / Function / Physical Skills ADL;Balance;Coordination;Decreased knowledge of precautions;Decreased knowledge of use of DME;Dexterity;Edema;Mobility;Tone;Strength;IADL;Sensation;GMC;Gait;ROM;FMC;Flexibility;Pain;Vision;UE functional use;Endurance    Cognitive Skills Attention;Perception;Problem Solve;Safety Awareness;Thought    Rehab Potential Good    Clinical Decision Making Several treatment options, min-mod task modification necessary    Comorbidities Affecting Occupational Performance: None    Modification or Assistance to Complete Evaluation  Min-Moderate modification of tasks or assist with assess necessary to complete eval    OT Frequency 2x / week    OT Duration 12 weeks    OT Treatment/Interventions Self-care/ADL training;Ultrasound;Energy conservation;Visual/perceptual remediation/compensation;Patient/family education;DME and/or AE instruction;Aquatic Therapy;Paraffin;Gait Training;Passive range of motion;Balance training;Fluidtherapy;Electrical Stimulation;Functional Mobility Training;Splinting;Moist Heat;Therapeutic exercise;Manual Therapy;Cognitive remediation/compensation;Manual lymph drainage;Neuromuscular education;Coping strategies training    Plan Pt is transferring her care to [facility] as it is closer to home. She remains very fearful of moving RUE and responds well to heat, and estim.    Consulted  and Agree with Plan of Care Patient             Patient will benefit from skilled therapeutic intervention in order to improve the following deficits and impairments:   Body Structure / Function / Physical Skills: ADL, Balance, Coordination, Decreased knowledge of precautions, Decreased knowledge of use of DME, Dexterity, Edema, Mobility, Tone, Strength, IADL, Sensation, GMC, Gait, ROM, FMC, Flexibility, Pain, Vision, UE functional use, Endurance Cognitive Skills: Attention, Perception, Problem Solve, Safety Awareness, Thought     Visit Diagnosis: Muscle weakness (generalized)  Other lack of coordination    Problem List Patient Active Problem List   Diagnosis Date Noted   Dyspareunia in female 04/05/2020   History of abnormal cervical Pap smear 03/10/2020   GIB (gastrointestinal bleeding) 02/28/2020   Elevated BUN    Prediabetes    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   Cerebral vasospasm    Sinus tachycardia    Dysphagia, post-stroke  Thrombocytopenia ([place])    Acute blood loss anemia    Brain aneurysm    ICH (intracerebral hemorrhage) ([place]) 01/05/2020   History of adenomatous polyp of colon 02/07/2016   Anatomical narrow angle, 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

## 2021-03-30 ENCOUNTER — Ambulatory Visit: Payer: BC Managed Care – PPO | Admitting: Occupational Therapy

## 2021-03-30 ENCOUNTER — Other Ambulatory Visit: Payer: Self-pay

## 2021-03-30 ENCOUNTER — Ambulatory Visit: Payer: BC Managed Care – PPO

## 2021-03-30 DIAGNOSIS — M25511 Pain in right shoulder: Secondary | ICD-10-CM

## 2021-03-30 DIAGNOSIS — M6281 Muscle weakness (generalized): Secondary | ICD-10-CM

## 2021-03-30 DIAGNOSIS — M79641 Pain in right hand: Secondary | ICD-10-CM

## 2021-03-30 DIAGNOSIS — I69159 Hemiplegia and hemiparesis following nontraumatic intracerebral hemorrhage affecting unspecified side: Secondary | ICD-10-CM

## 2021-03-30 DIAGNOSIS — R278 Other lack of coordination: Secondary | ICD-10-CM

## 2021-03-31 ENCOUNTER — Ambulatory Visit: Payer: BC Managed Care – PPO | Attending: Internal Medicine

## 2021-03-31 ENCOUNTER — Encounter: Payer: Self-pay | Admitting: Occupational Therapy

## 2021-03-31 DIAGNOSIS — Z23 Encounter for immunization: Secondary | ICD-10-CM

## 2021-03-31 NOTE — Therapy (Signed)
[place] MAIN [facility] SERVICES [address] [place], [place], [address] Phone: [phone]   Fax:  [phone]  Occupational Therapy Treatment  Patient Details  Name: Erika Stanton MRN: [id number] Date of Birth: 08-16-1955 No data recorded  Encounter Date: 03/30/2021   OT End of Session - 03/31/21 1818     Visit Number 106    Number of Visits 148    Date for OT Re-Evaluation 05/29/21    Authorization Type Progress report period starting 01/26/2021    OT Start Time 1300    OT Stop Time 1345    OT Time Calculation (min) 45 min    Activity Tolerance Patient tolerated treatment well    Behavior During Therapy [facility] for tasks assessed/performed             Past Medical History:  Diagnosis Date   Aneurysm ([place])    Melena     Past Surgical History:  Procedure Laterality Date   CHOLECYSTECTOMY     COLONOSCOPY  11/2017   at [facility]. no recurrent polyps.  suggest repeat surveillance study 11/2022.     COLONOSCOPY W/ POLYPECTOMY  06/2014   Dr Roxy Manns at [facility].  3 adenomatoous polyps, anal fissure.     ESOPHAGOGASTRODUODENOSCOPY (EGD) WITH PROPOFOL N/A 01/27/2020   Procedure: ESOPHAGOGASTRODUODENOSCOPY (EGD) WITH PROPOFOL;  Surgeon: Mauri Pole, MD;  Location: [place] [facility];  Service: Endoscopy;  Laterality: N/A;   IR 3D INDEPENDENT WKST  01/06/2020   IR ANGIO INTRA EXTRACRAN SEL INTERNAL CAROTID BILAT MOD SED  01/06/2020   IR ANGIO VERTEBRAL SEL VERTEBRAL UNI L MOD SED  01/06/2020   IR ANGIOGRAM FOLLOW UP STUDY  01/06/2020   IR ANGIOGRAM FOLLOW UP STUDY  01/06/2020   IR ANGIOGRAM FOLLOW UP STUDY  01/06/2020   IR ANGIOGRAM FOLLOW UP STUDY  01/06/2020   IR ANGIOGRAM FOLLOW UP STUDY  01/06/2020   IR ANGIOGRAM FOLLOW UP STUDY  01/06/2020   IR ANGIOGRAM FOLLOW UP STUDY  01/06/2020   IR ANGIOGRAM FOLLOW UP STUDY  01/06/2020   IR ANGIOGRAM FOLLOW UP STUDY  01/06/2020   IR ANGIOGRAM FOLLOW UP STUDY  01/06/2020   IR NEURO EACH ADD'L AFTER BASIC UNI  RIGHT (MS)  01/06/2020   IR TRANSCATH/EMBOLIZ  01/06/2020   RADIOLOGY WITH ANESTHESIA N/A 01/06/2020   Procedure: IR WITH ANESTHESIA FOR ANEURYSM;  Surgeon: Consuella Lose, MD;  Location: [place];  Service: Radiology;  Laterality: N/A;   TUBAL LIGATION      There were no vitals filed for this visit.   Subjective Assessment - 03/31/21 1817     Subjective  Pt reports she still hasn't gotten her retirement information resolved and is unsure of her insurance during the month of December.  She is planning on being on a vacation the first week of December and needs to cancel her appts for that week.    Pertinent History ICH 01/06/20, Aneurysm. PMH: OA bilateral knees and hands    Patient Stated Goals Pt would like to be as independent as possible.    Currently in Pain? Yes    Pain Score 3     Pain Location Hand    Pain Orientation Right    Pain Descriptors / Indicators Aching;Sore    Pain Type Chronic pain    Pain Onset More than a month ago    Pain Frequency Intermittent             Moist heat to right shoulder, forearm and hand  for 5 mins prior to tx session. Pt seen for PROM to RUE, all joints and planes within available range and pain tolerance.  Pain noted with any movements greater than 100 degrees of shoulder flexion.  Shoulder flexion, ABD, ADD, ER, elbow flexion/extension, forearm supination/pronation, wrist flexion/ext and digit flex/extension.  Prolonged stretching and focus on digits especially with ulnar side of hand which continues to demonstrate increased spasticity.  Following passive ROM, pt engaging in A/AROM to RUE in available range with therapist guiding and assist as needed.   Lateral pinch with use of yellow pinch pins, difficulty with red level resistance but able to slightly open pin and then place onto tabletop, multiple trials and reps completed.  Attempts at manipulation of [place] discs but unable to turn and flip, she is able to grossly grasp and release,  obtaining from therapist and placing onto tabletop.  Returned items from tabletop to bucket at her side.   Response to tx:   Pt continues to complain of pain in right thumb at times, tender to touch.  Spasticity present in ulnar side of right hand especially in the ring and small finger which remain in a flexed position most of the time.  She is still wearing a functional hand splint at night and reports she and her husband stretch her hand during the day to promote finger extension and to decrease flexion contractures.  Pt continues to demonstrate difficulty with manipulation skills with her right hand, she is able to engage in grasp and release of objects, has some lateral pinch present but has difficulty with turning or flipping objects in her hand.  Continue to work towards goals in plan of care to improve right hand function for daily tasks.                      OT Education - 03/31/21 1818     Education Details RUE grasp patterns    Person(s) Educated Patient    Methods Explanation;Verbal cues;Demonstration    Comprehension Verbalized understanding;Returned demonstration;Tactile cues required;Verbal cues required;Need further instruction              OT Short Term Goals - 03/07/21 1309       OT SHORT TERM GOAL #3   Baseline 03/07/21 assist only with clothing fasterners      OT SHORT TERM GOAL #4   Title Pt will demonstrate 60* A/ROM shoulder flexion for RUE with pain less than or equal to 4/10.    Baseline Pt. presents with limited isolated  right shoulder flexion with pain continues to be 2. 08/08/20: 40* AROM with 3/10 pain; 03/07/21: Pt demos 75* active shoulder flexion with 2/10 pain or less.    Time 4    Period Weeks    Status On-going    Target Date 04/17/21      OT SHORT TERM GOAL #5   Title Pt will demonstrate improved LUE fine motor coordination for ADLs as evidenced by decreasing 9 hole peg test score to 25 secs or less.    Baseline 10/31/2020: Left  FMC: 32 sec. Left FMC 41 sec visa the 9 hole peg test. 08/08/20: 32 seconds via 9 hole peg    Time 6    Period Weeks    Status On-going    Target Date 12/05/20               OT Long Term Goals - 03/07/21 1316       OT LONG TERM  GOAL #1   Title Pt will perform all basic ADLs with supervision    Baseline 01/24/21 assist with bra 12/13/2020 unchanged 6/202/2022: Pt. is able to donn stretchy shirts, donn slide on shoes, donning pants. Pt. has difficylty with managing a bra,shirt buttons, and pant zipper/button.Pt. requires minA from her husband following multiple recent surgeries. Pt. requires minA from her husband. 08/08/20: Pt continues to require MIN A for grooming and dressing tasks; 03/07/21: pt performs UB/LB bathing and dressing with set up (assist with clothing fasteners); min A for grooming d/t unable to manage hair with 2 hands    Time 12    Period Weeks    Status On-going    Target Date 05/29/21      OT LONG TERM GOAL #2   Title --    Baseline --    Time 12    Period Weeks    Status On-going      OT LONG TERM GOAL #3   Title Pt will use RUE as a stabilizer/ gross A at least 30 % of the time for ADLs/ IADLs.    Baseline Pt. is using her right hand as a gross stabilizer 15% of the time. 08/08/20: requires MAX cues; 03/07/21 Pt using R hand as a gross stabilizer at least 25% of the time.    Time 12    Period Weeks    Status On-going    Target Date 05/29/21      OT LONG TERM GOAL #4   Title Pt will increase LUE grip strength to 25 lbs or greater for increased ease with ADLs.    Baseline 12/13/20: 24# left, 1# right 10/31/2020: Pt. continues to present with limited left grip. Pt. has improved with left grip strength. Pt.continues to work towards 25#. 08/08/20: L grip 19#; 03/07/21: L grip 21 lbs (R 1 lb)    Time 12    Period Weeks    Status On-going    Target Date 05/29/21      OT LONG TERM GOAL #5   Title 10/31/2020: Pt. is now perfroming laundry, loading the dishwasher,  assists some with cooking, light meal preparation, setting the table    Baseline 12/13/2020:  continues to assist as needed but uses left hand .  10/13/20: Pt. assists with filling the dishwasher, and performing laundry. Pt. continues to try to help when she can, however her husband mostly performs these tasks. 08/08/20: Husband performs; 03/07/21: Pt assists with laundry, loading dishwasher, light cooking/meal prep; mostly using LUE.    Time 12    Period Weeks    Status On-going    Target Date 05/29/21      OT LONG TERM GOAL #6   Title Pt will demonstrate ability to grasp/ release a cup 2/3 trials with RUE.    Baseline 12/13/20:  Continues to work on consistency with grasp and release 10/31/2020: Pt. continues to initiate grasp, and active release of objects, however is unable to grasp/release a cup. 08/08/20: Able to grasp, unable to release x1 trial; 03/05/21: Able to grasp/release cup with min A to keep cup from tipping over.    Time 12    Period Weeks    Status On-going    Target Date 05/29/21      OT LONG TERM GOAL #7   Title Pt will demonstrate the ability to place clothing on hangers with use of bilateral UE and modified technique.    Baseline unable; 03/07/21: min-mod A    Time 12  Period Weeks    Status On-going    Target Date 05/29/21      OT LONG TERM GOAL #8   Title I with updated HEP.    Baseline 12/13/2020:  Continual changes to HEP 09/12/2020: Pt. conitinues to require assist for her HEP. Pt. requires assist, and cuing for HEP. 08/08/20: reports completing with husband assist for technique; 03/07/21: min vc    Time 12    Period Weeks    Status Partially Met    Target Date 05/29/21      OT LONG TERM GOAL  #9   TITLE Pt will demonstrate the ability to don and doff robe after shower with modified independence    Baseline unable; 03/07/21: pt reports she avoids her robe (too heavy); uses towel instead.    Time 12    Period Weeks    Status On-going    Target Date 05/29/21       OT LONG TERM GOAL  #10   TITLE Pt will demonstrate the ability to perform zippers with modified independence and use of adaptive equipment as needed.    Baseline difficulty; 03/07/21: cues to modify zippers with string loops (has not yet tried)    Time 12    Period Weeks    Status On-going    Target Date 05/29/21                   Plan - 03/31/21 1819     Clinical Impression Statement Pt continues to complain of pain in right thumb at times, tender to touch.  Spasticity present in ulnar side of right hand especially in the ring and small finger which remain in a flexed position most of the time.  She is still wearing a functional hand splint at night and reports she and her husband stretch her hand during the day to promote finger extension and to decrease flexion contractures.  Pt continues to demonstrate difficulty with manipulation skills with her right hand, she is able to engage in grasp and release of objects, has some lateral pinch present but has difficulty with turning or flipping objects in her hand.  Continue to work towards goals in plan of care to improve right hand function for daily tasks.    OT Occupational Profile and History Detailed Assessment- Review of Records and additional review of physical, cognitive, psychosocial history related to current functional performance    Occupational performance deficits (Please refer to evaluation for details): ADL's;IADL's;Rest and Sleep;Work;Play;Leisure    Body Structure / Function / Physical Skills ADL;Balance;Coordination;Decreased knowledge of precautions;Decreased knowledge of use of DME;Dexterity;Edema;Mobility;Tone;Strength;IADL;Sensation;GMC;Gait;ROM;FMC;Flexibility;Pain;Vision;UE functional use;Endurance    Cognitive Skills Attention;Perception;Problem Solve;Safety Awareness;Thought    Rehab Potential Good    Clinical Decision Making Several treatment options, min-mod task modification necessary    Comorbidities Affecting  Occupational Performance: None    Modification or Assistance to Complete Evaluation  Min-Moderate modification of tasks or assist with assess necessary to complete eval    OT Frequency 2x / week    OT Duration 12 weeks    OT Treatment/Interventions Self-care/ADL training;Ultrasound;Energy conservation;Visual/perceptual remediation/compensation;Patient/family education;DME and/or AE instruction;Aquatic Therapy;Paraffin;Gait Training;Passive range of motion;Balance training;Fluidtherapy;Electrical Stimulation;Functional Mobility Training;Splinting;Moist Heat;Therapeutic exercise;Manual Therapy;Cognitive remediation/compensation;Manual lymph drainage;Neuromuscular education;Coping strategies training    Plan Pt is transferring her care to [facility] as it is closer to home. She remains very fearful of moving RUE and responds well to heat, and estim.    Consulted and Agree with Plan of Care Patient  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, unspecified laterality (HCC)  Acute pain of right shoulder  Pain in right hand    Problem List Patient Active Problem List   Diagnosis Date Noted   Dyspareunia in female 04/05/2020   History of abnormal cervical Pap smear 03/10/2020   GIB (gastrointestinal bleeding) 02/28/2020   Elevated BUN    Prediabetes    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[place]) 01/20/2020    Cerebral vasospasm    Sinus tachycardia    Dysphagia, post-stroke    Thrombocytopenia (HCC)    Acute blood loss anemia    Brain aneurysm    ICH (intracerebral hemorrhage) ([place]) 01/05/2020   History of adenomatous polyp of colon 02/07/2016   Anatomical narrow angle, bilateral 12/14/2014   Fissure in ano 11/11/2014   Hemorrhoids 11/11/2014   Constipation 11/19/2013   GERD (gastroesophageal reflux disease) 03/24/2013   Irritable bowel syndrome with diarrhea 03/24/2013   Herpes zoster 05/14/2005   Abnormal Pap smear of cervix 05/15/1983   History of cervical dysplasia 05/15/1983   Takayla Baillie T Tomasita Morrow, OTR/L, CLT  Zamir Staples, OT/L 03/31/2021, 6:21 PM  [place] MAIN [facility] SERVICES [address] [place], [place], [address] Phone: [phone]   Fax:  [phone]  Name: Erika Stanton MRN: [id number] Date of Birth: 05/20/1955

## 2021-03-31 NOTE — Progress Notes (Signed)
   Covid-19 Vaccination Clinic  Name:  Erika Stanton    MRN: [id number] DOB: 1955-09-19  03/31/2021  [name] was observed post Covid-19 immunization for 15 minutes without incident. She was provided with Vaccine Information Sheet and instruction to access the V-Safe system.   [name] was instructed to call 911 with any severe reactions post vaccine: Difficulty breathing  Swelling of face and throat  A fast heartbeat  A bad rash all over body  Dizziness and weakness   Immunizations Administered     Name Date Dose VIS Date Route   Pfizer Covid-19 Vaccine Bivalent Booster 03/31/2021  2:50 PM 0.3 mL 01/11/2021 Intramuscular   Manufacturer: [employer]   Lot: [id number]   [place]: [place], PharmD, MBA Clinical Acute Care Pharmacist

## 2021-04-03 ENCOUNTER — Ambulatory Visit: Payer: BC Managed Care – PPO | Admitting: Occupational Therapy

## 2021-04-04 ENCOUNTER — Ambulatory Visit: Payer: BC Managed Care – PPO | Admitting: Occupational Therapy

## 2021-04-04 ENCOUNTER — Other Ambulatory Visit: Payer: Self-pay

## 2021-04-04 ENCOUNTER — Encounter: Payer: Self-pay | Admitting: Occupational Therapy

## 2021-04-04 ENCOUNTER — Other Ambulatory Visit: Payer: Self-pay | Admitting: Physical Medicine and Rehabilitation

## 2021-04-04 DIAGNOSIS — M6281 Muscle weakness (generalized): Secondary | ICD-10-CM | POA: Diagnosis not present

## 2021-04-04 DIAGNOSIS — R278 Other lack of coordination: Secondary | ICD-10-CM

## 2021-04-04 DIAGNOSIS — I69159 Hemiplegia and hemiparesis following nontraumatic intracerebral hemorrhage affecting unspecified side: Secondary | ICD-10-CM

## 2021-04-04 DIAGNOSIS — M25511 Pain in right shoulder: Secondary | ICD-10-CM

## 2021-04-04 MED ORDER — PFIZER COVID-19 VAC BIVALENT 30 MCG/0.3ML IM SUSP
INTRAMUSCULAR | 0 refills | Status: AC
  Filled 2021-04-04: qty 0.3, 1d supply, fill #0

## 2021-04-05 NOTE — Therapy (Signed)
[place] MAIN [facility] SERVICES [address] [place], [place], [address] Phone: [phone]   Fax:  [phone]  Occupational Therapy Treatment  Patient Details  Name: Erika Stanton MRN: [id number] Date of Birth: 1955/07/30 No data recorded  Encounter Date: 04/04/2021   OT End of Session - 04/05/21 1435     Visit Number 107    Number of Visits 148    Date for OT Re-Evaluation 05/29/21    Authorization Type Progress report period starting 01/26/2021    OT Start Time 1303    OT Stop Time 1347    OT Time Calculation (min) 44 min    Activity Tolerance Patient tolerated treatment well    Behavior During Therapy [facility] for tasks assessed/performed             Past Medical History:  Diagnosis Date   Aneurysm ([place])    Melena     Past Surgical History:  Procedure Laterality Date   CHOLECYSTECTOMY     COLONOSCOPY  11/2017   at [facility]. no recurrent polyps.  suggest repeat surveillance study 11/2022.     COLONOSCOPY W/ POLYPECTOMY  06/2014   Dr Roxy Manns at [facility]  3 adenomatoous polyps, anal fissure.     ESOPHAGOGASTRODUODENOSCOPY (EGD) WITH PROPOFOL N/A 01/27/2020   Procedure: ESOPHAGOGASTRODUODENOSCOPY (EGD) WITH PROPOFOL;  Surgeon: Mauri Pole, MD;  Location: [facility];  Service: Endoscopy;  Laterality: N/A;   IR 3D INDEPENDENT WKST  01/06/2020   IR ANGIO INTRA EXTRACRAN SEL INTERNAL CAROTID BILAT MOD SED  01/06/2020   IR ANGIO VERTEBRAL SEL VERTEBRAL UNI L MOD SED  01/06/2020   IR ANGIOGRAM FOLLOW UP STUDY  01/06/2020   IR ANGIOGRAM FOLLOW UP STUDY  01/06/2020   IR ANGIOGRAM FOLLOW UP STUDY  01/06/2020   IR ANGIOGRAM FOLLOW UP STUDY  01/06/2020   IR ANGIOGRAM FOLLOW UP STUDY  01/06/2020   IR ANGIOGRAM FOLLOW UP STUDY  01/06/2020   IR ANGIOGRAM FOLLOW UP STUDY  01/06/2020   IR ANGIOGRAM FOLLOW UP STUDY  01/06/2020   IR ANGIOGRAM FOLLOW UP STUDY  01/06/2020   IR ANGIOGRAM FOLLOW UP STUDY  01/06/2020   IR NEURO EACH ADD'L AFTER BASIC UNI  RIGHT (MS)  01/06/2020   IR TRANSCATH/EMBOLIZ  01/06/2020   RADIOLOGY WITH ANESTHESIA N/A 01/06/2020   Procedure: IR WITH ANESTHESIA FOR ANEURYSM;  Surgeon: Consuella Lose, MD;  Location: [facility];  Service: Radiology;  Laterality: N/A;   TUBAL LIGATION      There were no vitals filed for this visit.   Subjective Assessment - 04/04/21 1509     Subjective  Pt reports she is still trying to figure out her insurance for December and requesting to be on Hold for the month with being out of town and then transitioning to [employer]. She knows she will have her Medicare plan for January.    Pertinent History ICH 01/06/20, Aneurysm. PMH: OA bilateral knees and hands    Patient Stated Goals Pt would like to be as independent as possible.    Currently in Pain? Yes    Pain Score 3             Applied moist heat for five minutes prior to range of motion exercises to decrease pain and increase motion, prepare the arm for therapeutic exercise.  Therapeutic Exercise: Verbal review of exercises patient is performing at home with her the assistance of her husband. Patient reports she is engaging in daily range of motion  exercises, and wearing her splint at night time.  Patient seen for passive range of motion exercises to right upper extremity for shoulder flexion, abduction, adduction, external rotation, elbow flexion, and extension, forearm supination/pronation, wrist, flexion, and extension, digit flexion extension. Following passive range of motion, patient was seen for active assisted range of motion in all available planes in within patients pain tolerance. Patient engaging in reciprocal arm movement with use of UBE in standing with therapist assist to ensure grip on right side. Patient performed eight minutes total, forward/backward. Patient demonstrates greater difficulty with going in a backwards direction and requires increased assist.  Neuromuscular:   Patient seen for manipulation of ball  pegs to attempt to place into grid, however patient has difficulty with turning objects to appropriate position to place. If patient is handed the item, she is able to place into the board and can remove with increased effort for reaching patterns at table top level from a seated position.   Response to tx:  Patient has a planned vacation the first week of December, but has asked to be on hold for the remainder of December due to issues with insurance since she is retiring and her birthday is soon to qualify for Medicare. Will plan to review home exercise program next session and stress the importance of daily performance and use of right upper extremity as a gross assist to encourage functional use especially if she will be missing therapy for a few weeks. Pt demonstrates difficulty with flexion of index and middle finger while ring and small tend to stay in a flexed position.  Encouraged her to continue with prehension patterns to promote flexion in index and middle fingers for manipulation skills. Continue towards goals in plan of care to maximize safety and independence in daily tasks.                        OT Education - 04/05/21 1435     Education Details HEP    Person(s) Educated Patient    Methods Explanation;Verbal cues;Demonstration    Comprehension Verbalized understanding;Returned demonstration;Tactile cues required;Verbal cues required;Need further instruction              OT Short Term Goals - 03/07/21 1309       OT SHORT TERM GOAL #3   Baseline 03/07/21 assist only with clothing fasterners      OT SHORT TERM GOAL #4   Title Pt will demonstrate 60* A/ROM shoulder flexion for RUE with pain less than or equal to 4/10.    Baseline Pt. presents with limited isolated  right shoulder flexion with pain continues to be 2. 08/08/20: 40* AROM with 3/10 pain; 03/07/21: Pt demos 75* active shoulder flexion with 2/10 pain or less.    Time 4    Period Weeks     Status On-going    Target Date 04/17/21      OT SHORT TERM GOAL #5   Title Pt will demonstrate improved LUE fine motor coordination for ADLs as evidenced by decreasing 9 hole peg test score to 25 secs or less.    Baseline 10/31/2020: Left FMC: 32 sec. Left FMC 41 sec visa the 9 hole peg test. 08/08/20: 32 seconds via 9 hole peg    Time 6    Period Weeks    Status On-going    Target Date 12/05/20               OT Long Term Goals -  03/07/21 1316       OT LONG TERM GOAL #1   Title Pt will perform all basic ADLs with supervision    Baseline 01/24/21 assist with bra 12/13/2020 unchanged 6/202/2022: Pt. is able to donn stretchy shirts, donn slide on shoes, donning pants. Pt. has difficylty with managing a bra,shirt buttons, and pant zipper/button.Pt. requires minA from her husband following multiple recent surgeries. Pt. requires minA from her husband. 08/08/20: Pt continues to require MIN A for grooming and dressing tasks; 03/07/21: pt performs UB/LB bathing and dressing with set up (assist with clothing fasteners); min A for grooming d/t unable to manage hair with 2 hands    Time 12    Period Weeks    Status On-going    Target Date 05/29/21      OT LONG TERM GOAL #2   Title --    Baseline --    Time 12    Period Weeks    Status On-going      OT LONG TERM GOAL #3   Title Pt will use RUE as a stabilizer/ gross A at least 30 % of the time for ADLs/ IADLs.    Baseline Pt. is using her right hand as a gross stabilizer 15% of the time. 08/08/20: requires MAX cues; 03/07/21 Pt using R hand as a gross stabilizer at least 25% of the time.    Time 12    Period Weeks    Status On-going    Target Date 05/29/21      OT LONG TERM GOAL #4   Title Pt will increase LUE grip strength to 25 lbs or greater for increased ease with ADLs.    Baseline 12/13/20: 24# left, 1# right 10/31/2020: Pt. continues to present with limited left grip. Pt. has improved with left grip strength. Pt.continues to work  towards 25#. 08/08/20: L grip 19#; 03/07/21: L grip 21 lbs (R 1 lb)    Time 12    Period Weeks    Status On-going    Target Date 05/29/21      OT LONG TERM GOAL #5   Title 10/31/2020: Pt. is now perfroming laundry, loading the dishwasher, assists some with cooking, light meal preparation, setting the table    Baseline 12/13/2020:  continues to assist as needed but uses left hand .  10/13/20: Pt. assists with filling the dishwasher, and performing laundry. Pt. continues to try to help when she can, however her husband mostly performs these tasks. 08/08/20: Husband performs; 03/07/21: Pt assists with laundry, loading dishwasher, light cooking/meal prep; mostly using LUE.    Time 12    Period Weeks    Status On-going    Target Date 05/29/21      OT LONG TERM GOAL #6   Title Pt will demonstrate ability to grasp/ release a cup 2/3 trials with RUE.    Baseline 12/13/20:  Continues to work on consistency with grasp and release 10/31/2020: Pt. continues to initiate grasp, and active release of objects, however is unable to grasp/release a cup. 08/08/20: Able to grasp, unable to release x1 trial; 03/05/21: Able to grasp/release cup with min A to keep cup from tipping over.    Time 12    Period Weeks    Status On-going    Target Date 05/29/21      OT LONG TERM GOAL #7   Title Pt will demonstrate the ability to place clothing on hangers with use of bilateral UE and modified technique.    Baseline  unable; 03/07/21: min-mod A    Time 12    Period Weeks    Status On-going    Target Date 05/29/21      OT LONG TERM GOAL #8   Title I with updated HEP.    Baseline 12/13/2020:  Continual changes to HEP 09/12/2020: Pt. conitinues to require assist for her HEP. Pt. requires assist, and cuing for HEP. 08/08/20: reports completing with husband assist for technique; 03/07/21: min vc    Time 12    Period Weeks    Status Partially Met    Target Date 05/29/21      OT LONG TERM GOAL  #9   TITLE Pt will demonstrate the  ability to don and doff robe after shower with modified independence    Baseline unable; 03/07/21: pt reports she avoids her robe (too heavy); uses towel instead.    Time 12    Period Weeks    Status On-going    Target Date 05/29/21      OT LONG TERM GOAL  #10   TITLE Pt will demonstrate the ability to perform zippers with modified independence and use of adaptive equipment as needed.    Baseline difficulty; 03/07/21: cues to modify zippers with string loops (has not yet tried)    Time 12    Period Weeks    Status On-going    Target Date 05/29/21                   Plan - 04/05/21 1435     Clinical Impression Statement Patient has a planned vacation the first week of December, but has asked to be on hold for the remainder of December due to issues with insurance since she is retiring and her birthday is soon to qualify for Medicare. Will plan to review home exercise program next session and stress the importance of daily performance and use of right upper extremity as a gross assist to encourage functional use especially if she will be missing therapy for a few weeks. Pt demonstrates difficulty with flexion of index and middle finger while ring and small tend to stay in a flexed position.  Encouraged her to continue with prehension patterns to promote flexion in index and middle fingers for manipulation skills. Continue towards goals in plan of care to maximize safety and independence in daily tasks.    OT Occupational Profile and History Detailed Assessment- Review of Records and additional review of physical, cognitive, psychosocial history related to current functional performance    Occupational performance deficits (Please refer to evaluation for details): ADL's;IADL's;Rest and Sleep;Work;Play;Leisure    Body Structure / Function / Physical Skills ADL;Balance;Coordination;Decreased knowledge of precautions;Decreased knowledge of use of  DME;Dexterity;Edema;Mobility;Tone;Strength;IADL;Sensation;GMC;Gait;ROM;FMC;Flexibility;Pain;Vision;UE functional use;Endurance    Cognitive Skills Attention;Perception;Problem Solve;Safety Awareness;Thought    Rehab Potential Good    Clinical Decision Making Several treatment options, min-mod task modification necessary    Comorbidities Affecting Occupational Performance: None    Modification or Assistance to Complete Evaluation  Min-Moderate modification of tasks or assist with assess necessary to complete eval    OT Frequency 2x / week    OT Duration 12 weeks    OT Treatment/Interventions Self-care/ADL training;Ultrasound;Energy conservation;Visual/perceptual remediation/compensation;Patient/family education;DME and/or AE instruction;Aquatic Therapy;Paraffin;Gait Training;Passive range of motion;Balance training;Fluidtherapy;Electrical Stimulation;Functional Mobility Training;Splinting;Moist Heat;Therapeutic exercise;Manual Therapy;Cognitive remediation/compensation;Manual lymph drainage;Neuromuscular education;Coping strategies training    Plan Pt is transferring her care to [facility] as it is closer to home. She remains very fearful of moving RUE and responds well to heat,  and estim.    Consulted and Agree with Plan of Care Patient             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, unspecified  laterality (HCC)  Acute pain of right shoulder    Problem List Patient Active Problem List   Diagnosis Date Noted   Dyspareunia in female 04/05/2020   History of abnormal cervical Pap smear 03/10/2020   GIB (gastrointestinal bleeding) 02/28/2020   Elevated BUN    Prediabetes    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   Cerebral vasospasm    Sinus tachycardia    Dysphagia, post-stroke    Thrombocytopenia (HCC)    Acute blood loss anemia    Brain aneurysm    ICH (intracerebral hemorrhage) ([place]) 01/05/2020   History of adenomatous polyp of colon 02/07/2016   Anatomical narrow angle, 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 [place], [place], [address] Phone: [phone]   Fax:  [phone]  Name: KINDRA BICKHAM MRN: [id number] Date of Birth: Apr 20, 1956

## 2021-04-10 ENCOUNTER — Ambulatory Visit: Payer: BC Managed Care – PPO | Admitting: Occupational Therapy

## 2021-04-11 ENCOUNTER — Ambulatory Visit: Payer: BC Managed Care – PPO | Admitting: Occupational Therapy

## 2021-04-12 ENCOUNTER — Encounter: Payer: Self-pay | Admitting: Occupational Therapy

## 2021-04-12 ENCOUNTER — Other Ambulatory Visit: Payer: Self-pay

## 2021-04-12 ENCOUNTER — Ambulatory Visit: Payer: BC Managed Care – PPO | Admitting: Occupational Therapy

## 2021-04-12 DIAGNOSIS — M6281 Muscle weakness (generalized): Secondary | ICD-10-CM

## 2021-04-12 DIAGNOSIS — R278 Other lack of coordination: Secondary | ICD-10-CM

## 2021-04-12 NOTE — Therapy (Addendum)
[place] MAIN [facility] SERVICES [address] [place], [place], [address] Phone: [phone]   Fax:  [phone]  Occupational Therapy Treatment  Patient Details  Name: Erika Stanton MRN: [id number] Date of Birth: April 20, 1956 No data recorded  Encounter Date: 04/12/2021   OT End of Session - 04/12/21 1720     Visit Number 108    Number of Visits 148    Date for OT Re-Evaluation 05/29/21    Authorization Type Progress report period starting 01/26/2021    OT Start Time 1350    OT Stop Time 1430    OT Time Calculation (min) 40 min    Activity Tolerance Patient tolerated treatment well    Behavior During Therapy [facility] for tasks assessed/performed             Past Medical History:  Diagnosis Date   Aneurysm ([place])    Melena     Past Surgical History:  Procedure Laterality Date   CHOLECYSTECTOMY     COLONOSCOPY  11/2017   at [facility]. no recurrent polyps.  suggest repeat surveillance study 11/2022.     COLONOSCOPY W/ POLYPECTOMY  06/2014   Dr Roxy Manns at [facility].  3 adenomatoous polyps, anal fissure.     ESOPHAGOGASTRODUODENOSCOPY (EGD) WITH PROPOFOL N/A 01/27/2020   Procedure: ESOPHAGOGASTRODUODENOSCOPY (EGD) WITH PROPOFOL;  Surgeon: Mauri Pole, MD;  Location: [place] [facility];  Service: Endoscopy;  Laterality: N/A;   IR 3D INDEPENDENT WKST  01/06/2020   IR ANGIO INTRA EXTRACRAN SEL INTERNAL CAROTID BILAT MOD SED  01/06/2020   IR ANGIO VERTEBRAL SEL VERTEBRAL UNI L MOD SED  01/06/2020   IR ANGIOGRAM FOLLOW UP STUDY  01/06/2020   IR ANGIOGRAM FOLLOW UP STUDY  01/06/2020   IR ANGIOGRAM FOLLOW UP STUDY  01/06/2020   IR ANGIOGRAM FOLLOW UP STUDY  01/06/2020   IR ANGIOGRAM FOLLOW UP STUDY  01/06/2020   IR ANGIOGRAM FOLLOW UP STUDY  01/06/2020   IR ANGIOGRAM FOLLOW UP STUDY  01/06/2020   IR ANGIOGRAM FOLLOW UP STUDY  01/06/2020   IR ANGIOGRAM FOLLOW UP STUDY  01/06/2020   IR ANGIOGRAM FOLLOW UP STUDY  01/06/2020   IR NEURO EACH ADD'L AFTER BASIC UNI  RIGHT (MS)  01/06/2020   IR TRANSCATH/EMBOLIZ  01/06/2020   RADIOLOGY WITH ANESTHESIA N/A 01/06/2020   Procedure: IR WITH ANESTHESIA FOR ANEURYSM;  Surgeon: Consuella Lose, MD;  Location: [place];  Service: Radiology;  Laterality: N/A;   TUBAL LIGATION      There were no vitals filed for this visit.   Subjective Assessment - 04/12/21 1405     Subjective  Pt. reports that she is doing well..    Patient is accompanied by: Family member    Pertinent History ICH 01/06/20, Aneurysm. PMH: OA bilateral knees and hands    Repetition Decreases Symptoms    Currently in Pain? Yes    Pain Score 2     Pain Location Hand    Pain Orientation Right    Pain Descriptors / Indicators Aching;Sore    Pain Type Chronic pain            OT TREATMENT     Therapeutic Exercise:   Pt. was able to tolerate AROM/PROM for shoulder flexion, abduction, elbow flexion, elbow extension, AAROM/PROM for right wrist, digit MP, PIP, and DIP flexion, and extension, AROM digit MP, PIP, and DIP flexion, extension.Thumb radial, and palmar abduction. Emphasis was placed on right 4th, and 5th digit MP, PIP, and DIP extension.  Neuromuscular re-ed:   Pt. Worked on right hand  [facility] skills reaching up to remove 1" circular pegs from a pegboard positioned vertically at various angles.    Pt. Will be unable to return to the clinic until after the Holidays due to travel plans, and anticipated insurance changes. Pt. continues to improve with ROM in the right shoulder for flexion, and abduction with reaching, as well as 4th, and 5th digit ROM. Pt. Is tolerating ROM in her hand well. Pt. presents with less tightness in her 4th digit, however continues to present with flexor tightness in the right 4th, and 5th MPs.  Pt. Was able to grasp all peg from the board when reaching up in front of her with shoulder flexion, and out into abduction. Pt. Dropped several pegs while reaching up into scaption. Pt. continues to work on normalizing  tone in the right hand, and improving ROM in preparation for grasping, and handling objects, as well as reaching up to place them at various angles during ADLs, and IADL tasks.                        OT Education - 04/12/21 1720     Education Details HEP    Person(s) Educated Patient    Comprehension Verbalized understanding;Returned demonstration;Tactile cues required;Verbal cues required;Need further instruction              OT Short Term Goals - 03/07/21 1309       OT SHORT TERM GOAL #3   Baseline 03/07/21 assist only with clothing fasterners      OT SHORT TERM GOAL #4   Title Pt will demonstrate 60* A/ROM shoulder flexion for RUE with pain less than or equal to 4/10.    Baseline Pt. presents with limited isolated  right shoulder flexion with pain continues to be 2. 08/08/20: 40* AROM with 3/10 pain; 03/07/21: Pt demos 75* active shoulder flexion with 2/10 pain or less.    Time 4    Period Weeks    Status On-going    Target Date 04/17/21      OT SHORT TERM GOAL #5   Title Pt will demonstrate improved LUE fine motor coordination for ADLs as evidenced by decreasing 9 hole peg test score to 25 secs or less.    Baseline 10/31/2020: Left FMC: 32 sec. Left FMC 41 sec visa the 9 hole peg test. 08/08/20: 32 seconds via 9 hole peg    Time 6    Period Weeks    Status On-going    Target Date 12/05/20               OT Long Term Goals - 03/07/21 1316       OT LONG TERM GOAL #1   Title Pt will perform all basic ADLs with supervision    Baseline 01/24/21 assist with bra 12/13/2020 unchanged 6/202/2022: Pt. is able to donn stretchy shirts, donn slide on shoes, donning pants. Pt. has difficylty with managing a bra,shirt buttons, and pant zipper/button.Pt. requires minA from her husband following multiple recent surgeries. Pt. requires minA from her husband. 08/08/20: Pt continues to require MIN A for grooming and dressing tasks; 03/07/21: pt performs UB/LB bathing and  dressing with set up (assist with clothing fasteners); min A for grooming d/t unable to manage hair with 2 hands    Time 12    Period Weeks    Status On-going    Target Date 05/29/21  OT LONG TERM GOAL #2   Title --    Baseline --    Time 12    Period Weeks    Status On-going      OT LONG TERM GOAL #3   Title Pt will use RUE as a stabilizer/ gross A at least 30 % of the time for ADLs/ IADLs.    Baseline Pt. is using her right hand as a gross stabilizer 15% of the time. 08/08/20: requires MAX cues; 03/07/21 Pt using R hand as a gross stabilizer at least 25% of the time.    Time 12    Period Weeks    Status On-going    Target Date 05/29/21      OT LONG TERM GOAL #4   Title Pt will increase LUE grip strength to 25 lbs or greater for increased ease with ADLs.    Baseline 12/13/20: 24# left, 1# right 10/31/2020: Pt. continues to present with limited left grip. Pt. has improved with left grip strength. Pt.continues to work towards 25#. 08/08/20: L grip 19#; 03/07/21: L grip 21 lbs (R 1 lb)    Time 12    Period Weeks    Status On-going    Target Date 05/29/21      OT LONG TERM GOAL #5   Title 10/31/2020: Pt. is now perfroming laundry, loading the dishwasher, assists some with cooking, light meal preparation, setting the table    Baseline 12/13/2020:  continues to assist as needed but uses left hand .  10/13/20: Pt. assists with filling the dishwasher, and performing laundry. Pt. continues to try to help when she can, however her husband mostly performs these tasks. 08/08/20: Husband performs; 03/07/21: Pt assists with laundry, loading dishwasher, light cooking/meal prep; mostly using LUE.    Time 12    Period Weeks    Status On-going    Target Date 05/29/21      OT LONG TERM GOAL #6   Title Pt will demonstrate ability to grasp/ release a cup 2/3 trials with RUE.    Baseline 12/13/20:  Continues to work on consistency with grasp and release 10/31/2020: Pt. continues to initiate grasp, and active  release of objects, however is unable to grasp/release a cup. 08/08/20: Able to grasp, unable to release x1 trial; 03/05/21: Able to grasp/release cup with min A to keep cup from tipping over.    Time 12    Period Weeks    Status On-going    Target Date 05/29/21      OT LONG TERM GOAL #7   Title Pt will demonstrate the ability to place clothing on hangers with use of bilateral UE and modified technique.    Baseline unable; 03/07/21: min-mod A    Time 12    Period Weeks    Status On-going    Target Date 05/29/21      OT LONG TERM GOAL #8   Title I with updated HEP.    Baseline 12/13/2020:  Continual changes to HEP 09/12/2020: Pt. conitinues to require assist for her HEP. Pt. requires assist, and cuing for HEP. 08/08/20: reports completing with husband assist for technique; 03/07/21: min vc    Time 12    Period Weeks    Status Partially Met    Target Date 05/29/21      OT LONG TERM GOAL  #9   TITLE Pt will demonstrate the ability to don and doff robe after shower with modified independence    Baseline unable; 03/07/21: pt reports  she avoids her robe (too heavy); uses towel instead.    Time 12    Period Weeks    Status On-going    Target Date 05/29/21      OT LONG TERM GOAL  #10   TITLE Pt will demonstrate the ability to perform zippers with modified independence and use of adaptive equipment as needed.    Baseline difficulty; 03/07/21: cues to modify zippers with string loops (has not yet tried)    Time 12    Period Weeks    Status On-going    Target Date 05/29/21                   Plan - 04/12/21 1721     Clinical Impression Statement Pt. Will be unable to return to the clinic until after the Holidays due to travel plans, and anticipated insurance changes. Pt. continues to improve with ROM in the right shoulder for flexion, and abduction with reaching, as well as 4th, and 5th digit ROM. Pt. Is tolerating ROM in her hand well. Pt. presents with less tightness in her 4th  digit, however continues to present with flexor tightness in the right 4th, and 5th MPs.  Pt. Was able to grasp all peg from the board when reaching up in front of her with shoulder flexion, and out into abduction. Pt. Dropped several pegs while reaching up into scaption. Pt. continues to work on normalizing tone in the right hand, and improving ROM in preparation for grasping, and handling objects, as well as reaching up to place them at various angles during ADLs, and IADL tasks.         OT Occupational Profile and History Detailed Assessment- Review of Records and additional review of physical, cognitive, psychosocial history related to current functional performance    Occupational performance deficits (Please refer to evaluation for details): ADL's;IADL's;Rest and Sleep;Work;Play;Leisure    Body Structure / Function / Physical Skills ADL;Balance;Coordination;Decreased knowledge of precautions;Decreased knowledge of use of DME;Dexterity;Edema;Mobility;Tone;Strength;IADL;Sensation;GMC;Gait;ROM;FMC;Flexibility;Pain;Vision;UE functional use;Endurance    Cognitive Skills Attention;Perception;Problem Solve;Safety Awareness;Thought    Rehab Potential Good    Clinical Decision Making Several treatment options, min-mod task modification necessary    Comorbidities Affecting Occupational Performance: None    Modification or Assistance to Complete Evaluation  Min-Moderate modification of tasks or assist with assess necessary to complete eval    OT Frequency 2x / week    OT Duration 12 weeks    OT Treatment/Interventions Self-care/ADL training;Ultrasound;Energy conservation;Visual/perceptual remediation/compensation;Patient/family education;DME and/or AE instruction;Aquatic Therapy;Paraffin;Gait Training;Passive range of motion;Balance training;Fluidtherapy;Electrical Stimulation;Functional Mobility Training;Splinting;Moist Heat;Therapeutic exercise;Manual Therapy;Cognitive remediation/compensation;Manual  lymph drainage;Neuromuscular education;Coping strategies training    Plan Pt is transferring her care to [facility] OP as it is closer to home. She remains very fearful of moving RUE and responds well to heat, and estim.    Consulted and Agree with Plan of Care Patient             Patient will benefit from skilled therapeutic intervention in order to improve the following deficits and impairments:   Body Structure / Function / Physical Skills: ADL, Balance, Coordination, Decreased knowledge of precautions, Decreased knowledge of use of DME, Dexterity, Edema, Mobility, Tone, Strength, IADL, Sensation, GMC, Gait, ROM, FMC, Flexibility, Pain, Vision, UE functional use, Endurance Cognitive Skills: Attention, Perception, Problem Solve, Safety Awareness, Thought     Visit Diagnosis: Muscle weakness (generalized)  Other lack of coordination    Problem List Patient Active Problem List   Diagnosis Date Noted   Dyspareunia in  female 04/05/2020   History of abnormal cervical Pap smear 03/10/2020   GIB (gastrointestinal bleeding) 02/28/2020   Elevated BUN    Prediabetes    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   Cerebral vasospasm    Sinus tachycardia    Dysphagia, post-stroke    Thrombocytopenia (HCC)    Acute blood loss anemia    Brain aneurysm    ICH (intracerebral hemorrhage) ([place]) 01/05/2020   History of adenomatous polyp of colon 02/07/2016   Anatomical narrow angle, bilateral 12/14/2014   Fissure in ano 11/11/2014   Hemorrhoids 11/11/2014   Constipation 11/19/2013   GERD (gastroesophageal reflux disease) 03/24/2013   Irritable bowel syndrome with diarrhea 03/24/2013   Herpes zoster 05/14/2005   Abnormal Pap smear of cervix 05/15/1983   History of cervical dysplasia 05/15/1983    Harrel Carina, MS, OTR/L 04/12/2021, 5:39 PM  [place] MAIN [facility] SERVICES [address] [place], [place], [address] Phone: [phone]   Fax:   [phone]  Name: ADRIENA MANFRE MRN: [id number] Date of Birth: April 07, 1956

## 2021-04-13 ENCOUNTER — Ambulatory Visit: Payer: Medicare PPO | Admitting: Occupational Therapy

## 2021-04-17 ENCOUNTER — Ambulatory Visit: Payer: Medicare PPO | Admitting: Occupational Therapy

## 2021-04-18 ENCOUNTER — Ambulatory Visit: Payer: Medicare PPO | Admitting: Occupational Therapy

## 2021-04-19 ENCOUNTER — Ambulatory Visit: Payer: BC Managed Care – PPO | Admitting: Psychology

## 2021-04-20 ENCOUNTER — Ambulatory Visit: Payer: Medicare PPO | Admitting: Occupational Therapy

## 2021-04-24 ENCOUNTER — Ambulatory Visit: Payer: Medicare PPO | Admitting: Occupational Therapy

## 2021-04-25 ENCOUNTER — Ambulatory Visit: Payer: Medicare PPO | Admitting: Occupational Therapy

## 2021-04-27 ENCOUNTER — Ambulatory Visit: Payer: Medicare PPO | Admitting: Occupational Therapy

## 2021-04-28 ENCOUNTER — Other Ambulatory Visit: Payer: Self-pay

## 2021-04-28 ENCOUNTER — Encounter: Payer: Medicare PPO | Attending: Physical Medicine and Rehabilitation | Admitting: Physical Medicine and Rehabilitation

## 2021-04-28 VITALS — BP 135/84 | HR 67 | Temp 98.3°F | Wt 190.0 lb

## 2021-04-28 DIAGNOSIS — F411 Generalized anxiety disorder: Secondary | ICD-10-CM | POA: Diagnosis present

## 2021-04-28 DIAGNOSIS — I69819 Unspecified symptoms and signs involving cognitive functions following other cerebrovascular disease: Secondary | ICD-10-CM | POA: Insufficient documentation

## 2021-04-28 DIAGNOSIS — I69398 Other sequelae of cerebral infarction: Secondary | ICD-10-CM | POA: Diagnosis present

## 2021-04-28 DIAGNOSIS — I607 Nontraumatic subarachnoid hemorrhage from unspecified intracranial artery: Secondary | ICD-10-CM | POA: Diagnosis present

## 2021-04-28 DIAGNOSIS — R252 Cramp and spasm: Secondary | ICD-10-CM | POA: Insufficient documentation

## 2021-04-28 NOTE — Progress Notes (Signed)
Dysport 300U Injection for spasticity using needle US guidance  Indication: Severe spasticity which interferes with ADL,mobility and/or  hygiene and is unresponsive to medication management and other conservative care Informed consent was obtained after describing risks and benefits of the procedure with the patient. This includes bleeding, bruising, infection, excessive weakness, or medication side effects. A REMS form is on file and signed. Number of units per muscle:  FCR 100U FDS 100U FDP 100U All injections were done after obtaining appropriate [place] visualization and after negative drawback for blood. The patient tolerated the procedure well. Post procedure instructions were given. A followup appointment was made.

## 2021-05-01 ENCOUNTER — Ambulatory Visit: Payer: Medicare PPO | Admitting: Occupational Therapy

## 2021-05-02 ENCOUNTER — Ambulatory Visit: Payer: Medicare PPO | Admitting: Occupational Therapy

## 2021-05-04 ENCOUNTER — Ambulatory Visit: Payer: Medicare PPO | Admitting: Occupational Therapy

## 2021-05-09 ENCOUNTER — Ambulatory Visit: Payer: Medicare PPO | Admitting: Occupational Therapy

## 2021-05-10 ENCOUNTER — Encounter: Payer: Self-pay | Admitting: Psychology

## 2021-05-10 ENCOUNTER — Other Ambulatory Visit: Payer: Self-pay

## 2021-05-10 ENCOUNTER — Encounter: Payer: Medicare PPO | Admitting: Psychology

## 2021-05-10 DIAGNOSIS — I607 Nontraumatic subarachnoid hemorrhage from unspecified intracranial artery: Secondary | ICD-10-CM | POA: Diagnosis not present

## 2021-05-10 DIAGNOSIS — R252 Cramp and spasm: Secondary | ICD-10-CM | POA: Diagnosis not present

## 2021-05-10 DIAGNOSIS — I69819 Unspecified symptoms and signs involving cognitive functions following other cerebrovascular disease: Secondary | ICD-10-CM | POA: Diagnosis not present

## 2021-05-10 DIAGNOSIS — F411 Generalized anxiety disorder: Secondary | ICD-10-CM

## 2021-05-10 DIAGNOSIS — I69398 Other sequelae of cerebral infarction: Secondary | ICD-10-CM

## 2021-05-10 NOTE — Progress Notes (Signed)
Neuropsychological Consultation   Patient:   Erika Stanton   DOB:   June 12, 1955  MR Number:  [id number]  Location:  [place] [facility] [place], [place] [id number] [place] [address] Dept: [phone]           Date of Service:   05/10/2021  Start Time:   10 AM End Time:   11 AM  Provider/Observer:  [name], Psy.D.       Clinical Neuropsychologist       Billing Code/Service: [id number]/[id number]  Chief Complaint:    Erika Stanton is a 65 year old female referred by Leeroy Cha, MD who is her treating physiatrist due to residual effects of cerebrovascular that occurred on 01/05/2020.  The patient initially developed right-sided headache and left hemiparesis.  CT head showed right basal ganglia hemorrhage with small amount of subarachnoid hemorrhage and bilateral IVH.  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  Patient underwent stent supported embolism of right ICA aneurysm on 01/06/2020.  Postoperative lethargy was noted.  EEG showed evidence of epileptogenic arising from right temporal region as well as severe diffuse encephalopathy but no seizures.  Patient became obtunded and follow-up MRI done revealing acute cortical and subcortical infarct affecting the left anterior frontal and left parietal regions as well as punctuate micro emboli in the right frontal and parietal regions.  Patient had initial significant cognitive deficits with disorientation, and unintelligible speech and bouts of lethargy.  Patient was followed up with CIR admission and I saw her 1 time on the unit.  Patient has been followed by physiatry and neurology as well as neurosurgery for coiling of other aneurysms and has a plan for revision of initial repair coming up.  Patient also has other significant medical issue related to  ductal carcinoma of the left breast and status postlumpectomy of the left breast.  05/10/2021: Today was her first follow-up therapeutic visit after a long delay due to scheduling issues.  The patient came in initially along with her husband and her husband was in the visit for the first 5 minutes and then left for [place] to continue individually.  The patient's husband reports that there continue to be a lot of stressors and conflicts that have developed since the patient's cerebrovascular event.  The patient's husband reports that he struggles to cope with the changes and limitations of his wife and how to most effectively communicate and help her and acknowledges a lot of stress between the 2 developing.  The patient acknowledges these issues.  During her individual interactions the patient acknowledges some of the frustrations that have been growing in the relationship with their attempts to cope and manage with her residual deficits.  The patient continues to struggle with her loss of motor function in her right arm and reports that she will have a very strong emotional reaction when she struggles and there are a lot of emotional distress issues coping.  Behavioral Observation: Erika Stanton  presents as a 65 y.o.-year-old Right Caucasian Female who appeared her stated age. her dress was Appropriate and she was Well Groomed and her manners were Appropriate to the situation.  her participation was indicative of Appropriate and Attentive behaviors.  There were physical disabilities noted.  she displayed an appropriate level of cooperation and motivation.     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  Substance Use:  No concerns of substance abuse are reported.  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   Cerebral vasospasm    Sinus tachycardia    Dysphagia, post-stroke    Thrombocytopenia (HCC)    Acute blood loss anemia    Brain aneurysm    ICH (intracerebral hemorrhage) ([place]) 01/05/2020   History of adenomatous polyp of colon 02/07/2016   Anatomical narrow angle, bilateral 12/14/2014   Fissure in ano 11/11/2014   Hemorrhoids 11/11/2014   Constipation 11/19/2013   GERD (gastroesophageal reflux disease) 03/24/2013   Irritable bowel syndrome with diarrhea 03/24/2013   Herpes zoster 05/14/2005   Abnormal Pap smear of cervix 05/15/1983   History of cervical dysplasia 05/15/1983         Psychiatric History:  No prior psychiatric history  Family Med/Psych History:  Family History  Problem Relation Age of Onset   Cancer Mother    Congestive Heart Failure Mother    Lung cancer Father    Lung cancer Sister     Impression/DX:  Erika Stanton is a 65 year old female referred by Leeroy Cha, MD who is her treating physiatrist due to residual effects of cerebrovascular that occurred on 01/05/2020.  The patient initially developed right-sided headache and left hemiparesis.  CT head showed right basal ganglia hemorrhage with small amount of  subarachnoid hemorrhage and bilateral IVH.  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  Patient underwent stent supported embolism of right ICA aneurysm on 01/06/2020.  Postoperative lethargy was noted.  EEG showed evidence of epileptogenic arising from right temporal region as well as severe diffuse encephalopathy but no seizures.  Patient became obtunded and follow-up MRI done revealing acute cortical and subcortical infarct affecting the left anterior frontal and left parietal regions as well as punctuate micro emboli in the right frontal and parietal regions.  Patient had initial significant cognitive deficits with disorientation, and unintelligible speech and bouts of lethargy.  Patient was followed up with CIR admission and I saw her 1 time on the unit.  Patient has been followed by physiatry and neurology as well as neurosurgery for coiling of other aneurysms and has a plan for revision of initial repair coming up.  Patient also has other significant medical issue related to ductal carcinoma of the left breast and  status postlumpectomy of the left breast.  05/10/2021: Today was her first follow-up therapeutic visit after a long delay due to scheduling issues.  The patient came in initially along with her husband and her husband was in the visit for the first 5 minutes and then left for [place] to continue individually.  The patient's husband reports that there continue to be a lot of stressors and conflicts that have developed since the patient's cerebrovascular event.  The patient's husband reports that he struggles to cope with the changes and limitations of his wife and how to most effectively communicate and help her and acknowledges a lot of stress between the 2 developing.  The patient acknowledges these issues.  During her individual interactions the patient acknowledges some of the frustrations that have been growing in the  relationship with their attempts to cope and manage with her residual deficits.  The patient continues to struggle with her loss of motor function in her right arm and reports that she will have a very strong emotional reaction when she struggles and there are a lot of emotional distress issues coping.  Disposition/Plan:  Today, we began working on therapeutic interventions particularly around issues of coping and adjustment with residual/late effects of her cerebrovascular accident.  The patient continues to have some cognitive issues particularly with regard to attention and concentration but a more significant difficulty right now is anxiety and depressive types of symptoms with her frustration regarding ongoing physical limitations and the development of some conflicts between her and her husband where they are both having difficulty adjusting to changes in the patient.  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 Clinical Neuropsychologist

## 2021-05-11 ENCOUNTER — Ambulatory Visit: Payer: Medicare PPO | Admitting: Occupational Therapy

## 2021-05-16 ENCOUNTER — Encounter: Payer: Self-pay | Admitting: Occupational Therapy

## 2021-05-16 ENCOUNTER — Other Ambulatory Visit: Payer: Self-pay

## 2021-05-16 ENCOUNTER — Ambulatory Visit: Payer: Medicare PPO | Attending: Physical Medicine & Rehabilitation | Admitting: Occupational Therapy

## 2021-05-16 DIAGNOSIS — I69159 Hemiplegia and hemiparesis following nontraumatic intracerebral hemorrhage affecting unspecified side: Secondary | ICD-10-CM | POA: Diagnosis present

## 2021-05-16 DIAGNOSIS — R482 Apraxia: Secondary | ICD-10-CM | POA: Diagnosis present

## 2021-05-16 DIAGNOSIS — M6281 Muscle weakness (generalized): Secondary | ICD-10-CM | POA: Diagnosis not present

## 2021-05-16 DIAGNOSIS — R278 Other lack of coordination: Secondary | ICD-10-CM | POA: Diagnosis present

## 2021-05-16 NOTE — Therapy (Signed)
[place] MAIN [facility] SERVICES [address] [place], [place], [address] Phone: [phone]   Fax:  [phone]  Occupational Therapy Treatment  Patient Details  Name: Erika Stanton MRN: [id number] Date of Birth: [birth year] No data recorded  Encounter Date: 05/16/2021   OT End of Session - 05/16/21 1320     Visit Number 109    Number of Visits 148    Date for OT Re-Evaluation 05/29/21    Authorization Type Progress report period starting 01/26/2021    OT Start Time 1145    OT Stop Time 1230    OT Time Calculation (min) 45 min    Activity Tolerance Patient tolerated treatment well    Behavior During Therapy [facility] for tasks assessed/performed             Past Medical History:  Diagnosis Date   Aneurysm ([place])    Melena     Past Surgical History:  Procedure Laterality Date   CHOLECYSTECTOMY     COLONOSCOPY  11/2017   at [facility]. no recurrent polyps.  suggest repeat surveillance study 11/2022.     COLONOSCOPY W/ POLYPECTOMY  06/2014   Dr Roxy Manns at [facility].  3 adenomatoous polyps, anal fissure.     ESOPHAGOGASTRODUODENOSCOPY (EGD) WITH PROPOFOL N/A 01/27/2020   Procedure: ESOPHAGOGASTRODUODENOSCOPY (EGD) WITH PROPOFOL;  Surgeon: Mauri Pole, MD;  Location: [place] ENDOSCOPY;  Service: Endoscopy;  Laterality: N/A;   IR 3D INDEPENDENT WKST  01/06/2020   IR ANGIO INTRA EXTRACRAN SEL INTERNAL CAROTID BILAT MOD SED  01/06/2020   IR ANGIO VERTEBRAL SEL VERTEBRAL UNI L MOD SED  01/06/2020   IR ANGIOGRAM FOLLOW UP STUDY  01/06/2020   IR ANGIOGRAM FOLLOW UP STUDY  01/06/2020   IR ANGIOGRAM FOLLOW UP STUDY  01/06/2020   IR ANGIOGRAM FOLLOW UP STUDY  01/06/2020   IR ANGIOGRAM FOLLOW UP STUDY  01/06/2020   IR ANGIOGRAM FOLLOW UP STUDY  01/06/2020   IR ANGIOGRAM FOLLOW UP STUDY  01/06/2020   IR ANGIOGRAM FOLLOW UP STUDY  01/06/2020   IR ANGIOGRAM FOLLOW UP STUDY  01/06/2020   IR ANGIOGRAM FOLLOW UP STUDY  01/06/2020   IR NEURO EACH ADD'L AFTER BASIC UNI RIGHT  (MS)  01/06/2020   IR TRANSCATH/EMBOLIZ  01/06/2020   RADIOLOGY WITH ANESTHESIA N/A 01/06/2020   Procedure: IR WITH ANESTHESIA FOR ANEURYSM;  Surgeon: Consuella Lose, MD;  Location: [place];  Service: Radiology;  Laterality: N/A;   TUBAL LIGATION      There were no vitals filed for this visit.   Subjective Assessment - 05/16/21 1310     Subjective  Pt. reports that she had been unable to grasp with her right hand since having the most recent Botox injection.    Patient is accompanied by: Family member    Pertinent History ICH 01/06/20, Aneurysm. PMH: OA bilateral knees and hands    Patient Stated Goals Pt would like to be as independent as possible.    Currently in Pain? Yes    Pain Score --   Not rated   Pain Location Hand    Pain Orientation Right    Pain Descriptors / Indicators Aching;Sore    Pain Type Chronic pain            OT TREATMENT    Therapeutic Exercise:  Pt. tolerated PROM to the right hand with slow gentle stretching for the right 2nd, 3rd, 4th, and 5th digit MP, PIP, and DIP joints with emphasis on flexion secondary  to extensor tightness in the 2nd, and 3rd digits, and emphasis on extension in the 4th, and 5th digits secondary to flexion tightness. Pt. Tolerated thumb radial, and palmar abduction. There. Ex was performed following, and in conjunction with moist heat.   Pt. has returned for therapy. Pt. Reports having had Botox injections in December, and that she has been unable to grasp objects since. Pt. present with right hand extensor tightness in the 2nd, and 3rd digit MPs, PIPs, and DIPs. Pt. Presents with tightness at the thumb MP joint limiting  radial, and palmar abduction.These limitations hinder the movements required for formulating grasp patterns. Pt. also presents with 4th, and 5th digit MP, PIP, and DIP flexor tightness. Pt. Tolerated 2nd and 3rd digit MP, PIP, and DIP flexion individually at each joint, however, has discomfort when attempting to flex the  the MP, PIP, and DIP together in each each digit. Pt. Continues to work on improving right hand ROM to prepare the right hand for functional grasping objects during ADLs, and IADLs. Pt. reports that she trying to use her left hand for daily tasks, however reports that it is awkward for her to use it during self-feeding, and ADL tasks.                       OT Education - 05/16/21 1315     Education Details ROM, [facility]    Person(s) Educated Patient    Methods Explanation;Verbal cues;Demonstration    Comprehension Verbalized understanding;Returned demonstration;Tactile cues required;Verbal cues required;Need further instruction              OT Short Term Goals - 03/07/21 1309       OT SHORT TERM GOAL #3   Baseline 03/07/21 assist only with clothing fasterners      OT SHORT TERM GOAL #4   Title Pt will demonstrate 60* A/ROM shoulder flexion for RUE with pain less than or equal to 4/10.    Baseline Pt. presents with limited isolated  right shoulder flexion with pain continues to be 2. 08/08/20: 40* AROM with 3/10 pain; 03/07/21: Pt demos 75* active shoulder flexion with 2/10 pain or less.    Time 4    Period Weeks    Status On-going    Target Date 04/17/21      OT SHORT TERM GOAL #5   Title Pt will demonstrate improved LUE fine motor coordination for ADLs as evidenced by decreasing 9 hole peg test score to 25 secs or less.    Baseline 10/31/2020: Left FMC: 32 sec. Left FMC 41 sec visa the 9 hole peg test. 08/08/20: 32 seconds via 9 hole peg    Time 6    Period Weeks    Status On-going    Target Date 12/05/20               OT Long Term Goals - 03/07/21 1316       OT LONG TERM GOAL #1   Title Pt will perform all basic ADLs with supervision    Baseline 01/24/21 assist with bra 12/13/2020 unchanged 6/202/2022: Pt. is able to donn stretchy shirts, donn slide on shoes, donning pants. Pt. has difficylty with managing a bra,shirt buttons, and pant zipper/button.Pt.  requires minA from her husband following multiple recent surgeries. Pt. requires minA from her husband. 08/08/20: Pt continues to require MIN A for grooming and dressing tasks; 03/07/21: pt performs UB/LB bathing and dressing with set up (assist with clothing fasteners); min A  for grooming d/t unable to manage hair with 2 hands    Time 12    Period Weeks    Status On-going    Target Date 05/29/21      OT LONG TERM GOAL #2   Title --    Baseline --    Time 12    Period Weeks    Status On-going      OT LONG TERM GOAL #3   Title Pt will use RUE as a stabilizer/ gross A at least 30 % of the time for ADLs/ IADLs.    Baseline Pt. is using her right hand as a gross stabilizer 15% of the time. 08/08/20: requires MAX cues; 03/07/21 Pt using R hand as a gross stabilizer at least 25% of the time.    Time 12    Period Weeks    Status On-going    Target Date 05/29/21      OT LONG TERM GOAL #4   Title Pt will increase LUE grip strength to 25 lbs or greater for increased ease with ADLs.    Baseline 12/13/20: 24# left, 1# right 10/31/2020: Pt. continues to present with limited left grip. Pt. has improved with left grip strength. Pt.continues to work towards 25#. 08/08/20: L grip 19#; 03/07/21: L grip 21 lbs (R 1 lb)    Time 12    Period Weeks    Status On-going    Target Date 05/29/21      OT LONG TERM GOAL #5   Title 10/31/2020: Pt. is now perfroming laundry, loading the dishwasher, assists some with cooking, light meal preparation, setting the table    Baseline 12/13/2020:  continues to assist as needed but uses left hand .  10/13/20: Pt. assists with filling the dishwasher, and performing laundry. Pt. continues to try to help when she can, however her husband mostly performs these tasks. 08/08/20: Husband performs; 03/07/21: Pt assists with laundry, loading dishwasher, light cooking/meal prep; mostly using LUE.    Time 12    Period Weeks    Status On-going    Target Date 05/29/21      OT LONG TERM GOAL  #6   Title Pt will demonstrate ability to grasp/ release a cup 2/3 trials with RUE.    Baseline 12/13/20:  Continues to work on consistency with grasp and release 10/31/2020: Pt. continues to initiate grasp, and active release of objects, however is unable to grasp/release a cup. 08/08/20: Able to grasp, unable to release x1 trial; 03/05/21: Able to grasp/release cup with min A to keep cup from tipping over.    Time 12    Period Weeks    Status On-going    Target Date 05/29/21      OT LONG TERM GOAL #7   Title Pt will demonstrate the ability to place clothing on hangers with use of bilateral UE and modified technique.    Baseline unable; 03/07/21: min-mod A    Time 12    Period Weeks    Status On-going    Target Date 05/29/21      OT LONG TERM GOAL #8   Title I with updated HEP.    Baseline 12/13/2020:  Continual changes to HEP 09/12/2020: Pt. conitinues to require assist for her HEP. Pt. requires assist, and cuing for HEP. 08/08/20: reports completing with husband assist for technique; 03/07/21: min vc    Time 12    Period Weeks    Status Partially Met    Target Date 05/29/21  OT LONG TERM GOAL  #9   TITLE Pt will demonstrate the ability to don and doff robe after shower with modified independence    Baseline unable; 03/07/21: pt reports she avoids her robe (too heavy); uses towel instead.    Time 12    Period Weeks    Status On-going    Target Date 05/29/21      OT LONG TERM GOAL  #10   TITLE Pt will demonstrate the ability to perform zippers with modified independence and use of adaptive equipment as needed.    Baseline difficulty; 03/07/21: cues to modify zippers with string loops (has not yet tried)    Time 12    Period Weeks    Status On-going    Target Date 05/29/21                   Plan - 05/16/21 1325     Clinical Impression Statement Pt. has returned for therapy. Pt. Reports having had Botox injections in December, and that she has been unable to grasp  objects since. Pt. present with right hand extensor tightness in the 2nd, and 3rd digit MPs, PIPs, and DIPs. Pt. Presents with tightness at the thumb MP joint limiting  radial, and palmar abduction.These limitations hinder the movements required for formulating grasp patterns. Pt. also presents with 4th, and 5th digit MP, PIP, and DIP flexor tightness. Pt. Tolerated 2nd and 3rd digit MP, PIP, and DIP flexion individually at each joint, however, has discomfort when attempting to flex the the MP, PIP, and DIP together in each each digit. Pt. Continues to work on improving right hand ROM to prepare the right hand for functional grasping objects during ADLs, and IADLs. Pt. reports that she trying to use her left hand for daily tasks, however reports that it is awkward for her to use it during self-feeding, and ADL tasks.     OT Occupational Profile and History Detailed Assessment- Review of Records and additional review of physical, cognitive, psychosocial history related to current functional performance    Occupational performance deficits (Please refer to evaluation for details): ADL's;IADL's;Rest and Sleep;Work;Play;Leisure    Body Structure / Function / Physical Skills ADL;Balance;Coordination;Decreased knowledge of precautions;Decreased knowledge of use of DME;Dexterity;Edema;Mobility;Tone;Strength;IADL;Sensation;GMC;Gait;ROM;FMC;Flexibility;Pain;Vision;UE functional use;Endurance    Cognitive Skills Attention;Perception;Problem Solve;Safety Awareness;Thought    Rehab Potential Good    Clinical Decision Making Several treatment options, min-mod task modification necessary    Comorbidities Affecting Occupational Performance: None    Modification or Assistance to Complete Evaluation  Min-Moderate modification of tasks or assist with assess necessary to complete eval    OT Frequency 2x / week    OT Duration 12 weeks    OT Treatment/Interventions Self-care/ADL training;Ultrasound;Energy  conservation;Visual/perceptual remediation/compensation;Patient/family education;DME and/or AE instruction;Aquatic Therapy;Paraffin;Gait Training;Passive range of motion;Balance training;Fluidtherapy;Electrical Stimulation;Functional Mobility Training;Splinting;Moist Heat;Therapeutic exercise;Manual Therapy;Cognitive remediation/compensation;Manual lymph drainage;Neuromuscular education;Coping strategies training    Plan Pt is transferring her care to [facility] OP as it is closer to home. She remains very fearful of moving RUE and responds well to heat, and estim.    Consulted and Agree with Plan of Care Patient             Patient will benefit from skilled therapeutic intervention in order to improve the following deficits and impairments:   Body Structure / Function / Physical Skills: ADL, Balance, Coordination, Decreased knowledge of precautions, Decreased knowledge of use of DME, Dexterity, Edema, Mobility, Tone, Strength, IADL, Sensation, GMC, Gait, ROM, FMC, Flexibility, Pain, Vision, UE functional  use, Endurance Cognitive Skills: Attention, Perception, Problem Solve, Safety Awareness, Thought     Visit Diagnosis: Muscle weakness (generalized)  Other lack of coordination    Problem List Patient Active Problem List   Diagnosis Date Noted   Dyspareunia in female 04/05/2020   History of abnormal cervical Pap smear 03/10/2020   GIB (gastrointestinal bleeding) 02/28/2020   Elevated BUN    Prediabetes    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   Cerebral vasospasm    Sinus tachycardia    Dysphagia, post-stroke    Thrombocytopenia ([facility])    Acute blood loss anemia    Brain aneurysm    ICH (intracerebral hemorrhage) ([place]) 01/05/2020   History of adenomatous polyp of colon 02/07/2016   Anatomical narrow angle, 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 [place], [place], [address] Phone: [phone]   Fax:  [phone]  Name: Erika Stanton MRN: [id number] Date of Birth: 10-27-1955

## 2021-05-18 ENCOUNTER — Ambulatory Visit: Payer: Medicare PPO | Admitting: Occupational Therapy

## 2021-05-18 ENCOUNTER — Encounter: Payer: Self-pay | Admitting: Occupational Therapy

## 2021-05-18 ENCOUNTER — Other Ambulatory Visit: Payer: Self-pay

## 2021-05-18 DIAGNOSIS — R278 Other lack of coordination: Secondary | ICD-10-CM

## 2021-05-18 DIAGNOSIS — M6281 Muscle weakness (generalized): Secondary | ICD-10-CM

## 2021-05-18 NOTE — Therapy (Signed)
[place] MAIN [facility] SERVICES [address] [place], [place], [address] Phone: [phone]   Fax:  [phone]  Occupational Therapy Progress/Recertification Note  Dates of reporting period  01/26/2021   to   05/18/2021   Patient Details  Name: Erika Stanton MRN: [id number] Date of Birth: 1955-10-09 No data recorded  Encounter Date: 05/18/2021   OT End of Session - 05/18/21 1416     Visit Number 110    Number of Visits 148    Date for OT Re-Evaluation 08/21/21    Authorization Type Progress report period starting 01/26/2021    Authorization Time Period Medicare    OT Start Time 1300    OT Stop Time 1345    OT Time Calculation (min) 45 min    Activity Tolerance Patient tolerated treatment well    Behavior During Therapy [facility] for tasks assessed/performed             Past Medical History:  Diagnosis Date   Aneurysm ([place])    Melena     Past Surgical History:  Procedure Laterality Date   CHOLECYSTECTOMY     COLONOSCOPY  11/2017   at [facility]. no recurrent polyps.  suggest repeat surveillance study 11/2022.     COLONOSCOPY W/ POLYPECTOMY  06/2014   Dr Roxy Manns at [facility].  3 adenomatoous polyps, anal fissure.     ESOPHAGOGASTRODUODENOSCOPY (EGD) WITH PROPOFOL N/A 01/27/2020   Procedure: ESOPHAGOGASTRODUODENOSCOPY (EGD) WITH PROPOFOL;  Surgeon: Mauri Pole, MD;  Location: [facility];  Service: Endoscopy;  Laterality: N/A;   IR 3D INDEPENDENT WKST  01/06/2020   IR ANGIO INTRA EXTRACRAN SEL INTERNAL CAROTID BILAT MOD SED  01/06/2020   IR ANGIO VERTEBRAL SEL VERTEBRAL UNI L MOD SED  01/06/2020   IR ANGIOGRAM FOLLOW UP STUDY  01/06/2020   IR ANGIOGRAM FOLLOW UP STUDY  01/06/2020   IR ANGIOGRAM FOLLOW UP STUDY  01/06/2020   IR ANGIOGRAM FOLLOW UP STUDY  01/06/2020   IR ANGIOGRAM FOLLOW UP STUDY  01/06/2020   IR ANGIOGRAM FOLLOW UP STUDY  01/06/2020   IR ANGIOGRAM FOLLOW UP STUDY  01/06/2020   IR ANGIOGRAM FOLLOW UP STUDY  01/06/2020   IR  ANGIOGRAM FOLLOW UP STUDY  01/06/2020   IR ANGIOGRAM FOLLOW UP STUDY  01/06/2020   IR NEURO EACH ADD'L AFTER BASIC UNI RIGHT (MS)  01/06/2020   IR TRANSCATH/EMBOLIZ  01/06/2020   RADIOLOGY WITH ANESTHESIA N/A 01/06/2020   Procedure: IR WITH ANESTHESIA FOR ANEURYSM;  Surgeon: Consuella Lose, MD;  Location: [place];  Service: Radiology;  Laterality: N/A;   TUBAL LIGATION      There were no vitals filed for this visit.   Subjective Assessment - 05/18/21 1415     Subjective  Pt., and husband were present for the treatment session.    Patient is accompanied by: Family member    Pertinent History ICH 01/06/20, Aneurysm. PMH: OA bilateral knees and hands    Repetition Decreases Symptoms    Patient Stated Goals Pt would like to be as independent as possible.    Currently in Pain? Yes    Pain Score --   Not rated           Measurements were obtained, and goals were reviewed. Pt.'s husband present initially to discuss concerns about noted changes in her right hand. Education was provided to the pt., and her husband about ROM, and impacts to her right hand function. Pt. resents with right 2nd, and 3rd digit PIP  extensor tightness which limits her ability to initiate, and formulate grasping motion. Pt. Continues to present with 4th, and 5th digit MP, and PIP flexor tightness. Pt. and her husband were provided with education about PROM, and self-ROM. Pt. Continues to need OT services to work towards improving ROM, regaining function in the right hand, and improve overall left hand functioning during ADLs, and IADL tasks. Pt. continues need OT services to work towards improving right hand ROM,  formulate grasp patterns in preparation for reaching, and [facility] skills needed to improve engagement during ADLs, and IADL tasks, and maximizing overall independence.                       OT Education - 05/18/21 1416     Education Details ROM, [facility]    Person(s) Educated Patient    Methods  Explanation;Verbal cues;Demonstration    Comprehension Verbalized understanding;Returned demonstration;Tactile cues required;Verbal cues required;Need further instruction              OT Short Term Goals - 05/18/21 1418       OT SHORT TERM GOAL #4   Baseline Pt. presents with limited isolated  right shoulder flexion with pain continues to be 2. 08/08/20: 40* AROM with 3/10 pain; 03/07/21: Pt demos 75* active shoulder flexion with 2/10 pain or less. 40th visit: Pt. continues to present with limited shoulder ROM, and pain has decreased.    Time 4    Period Weeks    Status On-going    Target Date 08/21/21      OT SHORT TERM GOAL #5   Title Pt will demonstrate improved LUE fine motor coordination for ADLs as evidenced by decreasing 9 hole peg test score to 25 secs or less.    Baseline 10/31/2020: Left FMC: 32 sec. Left FMC 41 sec visa the 9 hole peg test. 08/08/20: 32 seconds via 9 hole peg 40th visit: Left hand: 35 sec.    Time 12    Period Weeks    Status On-going    Target Date 08/21/21               OT Long Term Goals - 05/18/21 1422       OT LONG TERM GOAL #1   Title Pt will perform all basic ADLs with supervision    Baseline 01/24/21 assist with bra 12/13/2020 unchanged 6/202/2022: Pt. is able to donn stretchy shirts, donn slide on shoes, donning pants. Pt. has difficylty with managing a bra,shirt buttons, and pant zipper/button.Pt. requires minA from her husband following multiple recent surgeries. Pt. requires minA from her husband. 08/08/20: Pt continues to require MIN A for grooming and dressing tasks; 03/07/21: pt performs UB/LB bathing and dressing with set up (assist with clothing fasteners); min A for grooming d/t unable to manage hair with 2 hands 40th visit: Pt. conitnues to require minA  for self-grooming, Pt. continues to perform UB/LB bathing/dressing, assist with fasteners    Time 12    Period Weeks    Status On-going    Target Date 08/21/21      OT LONG TERM  GOAL #2   Title Pt will demonstrate 60* RUE shoulder flexion in prep for functional reach with pain less than or equal to 3/10.    Baseline 12/13/20:  shoulder flexion right 64 degrees 10/31/20: Pt. continues to present with limited AROM for isolated  right shoulder flexion with pain. 08/08/20: R AROM Shoulder flexion - 45* c 3/10 pain.  Time 12    Period Weeks    Status On-going    Target Date 08/21/21      OT LONG TERM GOAL #3   Title Pt will use RUE as a stabilizer/ gross A at least 30 % of the time for ADLs/ IADLs.    Baseline Pt. is using her right hand as a gross stabilizer 15% of the time. 08/08/20: requires MAX cues; 03/07/21 Pt using R hand as a gross stabilizer at least 25% of the time. 40th visit: Pt. has difficulty using her right hand  as a gross stabilizer.    Time 12    Period Weeks    Status On-going    Target Date 08/21/21      OT LONG TERM GOAL #4   Title Pt will increase LUE grip strength to 25 lbs or greater for increased ease with ADLs.    Baseline 12/13/20: 24# left, 1# right 10/31/2020: Pt. continues to present with limited left grip. Pt. has improved with left grip strength. Pt.continues to work towards 25#. 08/08/20: L grip 19#; 03/07/21: L grip 21 lbs (R 1 lb) 05/18/2021: Left grip strength: 30#    Time 12    Period Weeks    Status On-going    Target Date 08/21/21      OT LONG TERM GOAL #5   Title 10/31/2020: Pt. is now perfroming laundry, loading the dishwasher, assists some with cooking, light meal preparation, setting the table    Baseline 12/13/2020:  continues to assist as needed but uses left hand .  10/13/20: Pt. assists with filling the dishwasher, and performing laundry. Pt. continues to try to help when she can, however her husband mostly performs these tasks. 08/08/20: Husband performs; 03/07/21: Pt assists with laundry, loading dishwasher, light cooking/meal prep; mostly using LUE. 05/18/2021: pt. continues to assist with laundry skills, loading the dishwasher, light  cooking using the left hand    Time 12    Period Weeks    Status On-going    Target Date 08/21/21      OT LONG TERM GOAL #6   Title Pt will demonstrate ability to grasp/ release a cup 2/3 trials with RUE.    Baseline 12/13/20:  Continues to work on consistency with grasp and release 10/31/2020: Pt. continues to initiate grasp, and active release of objects, however is unable to grasp/release a cup. 08/08/20: Able to grasp, unable to release x1 trial; 03/05/21: Able to grasp/release cup with min A to keep cup from tipping over.05/18/2021: Pt. is unable to grasp items secondary to changes in the right hand following Botox injections. Pt. has 2nd,a nd 3rd digit PIP extensor tightness.    Time 12    Period Weeks    Status On-going    Target Date 08/21/21      OT LONG TERM GOAL #7   Title Pt will demonstrate the ability to place clothing on hangers with use of bilateral UE and modified technique.    Baseline unable; 03/07/21: min-mod A    Time 12    Period Weeks    Status On-going    Target Date 08/21/21      OT LONG TERM GOAL #8   Title I with updated HEP.    Baseline 12/13/2020: Continual changes to HEP 09/12/2020: Pt. continues to require assist for her HEP. Pt. requires assist, and cuing for HEP. 08/08/20: reports completing with husband assist for technique; 03/07/21: min vc. 05/18/2021: Pt. requires assist with HEPs, Husband assists with HEP.  Time 12    Period Weeks    Status Partially Met    Target Date 08/21/21      OT LONG TERM GOAL  #9   TITLE Pt will demonstrate the ability to don and doff robe after shower with modified independence    Baseline unable; 03/07/21: pt reports she avoids her robe (too heavy); uses towel instead.    Time 12    Period Weeks    Status Deferred      OT LONG TERM GOAL  #10   TITLE Pt will demonstrate the ability to perform zippers with modified independence and use of adaptive equipment as needed.    Baseline difficulty; 03/07/21: cues to modify zippers  with string loops (has not yet tried) 05/18/2021: TBD    Time 12    Period Weeks    Status On-going    Target Date 08/21/21                   Plan - 05/18/21 1418     Clinical Impression Statement Measurements were obtained, and goals were reviewed. Pt.'s husband present initially to discuss concerns about noted changes in her right hand. Education was provided to the pt., and her husband about ROM, and impacts to her right hand function. Pt. resents with right 2nd, and 3rd digit PIP extensor tightness which limits her ability to initiate, and formulate grasping motion. Pt. Continues to present with 4th, and 5th digit MP, and PIP flexor tightness. Pt. and her husband were provided with education about PROM, and self-ROM. Pt. Continues to need OT services to work towards improving ROM, regaining function in the right hand, and improve overall left hand functioning during ADLs, and IADL tasks. Pt. continues need OT services to work towards improving right hand ROM,  formulate grasp patterns in preparation for reaching, and [facility] skills needed to improve engagement during ADLs, and IADL tasks, and maximizing overall independence.      OT Occupational Profile and History Detailed Assessment- Review of Records and additional review of physical, cognitive, psychosocial history related to current functional performance    Occupational performance deficits (Please refer to evaluation for details): ADL's;IADL's;Rest and Sleep;Work;Play;Leisure    Body Structure / Function / Physical Skills ADL;Balance;Coordination;Decreased knowledge of precautions;Decreased knowledge of use of DME;Dexterity;Edema;Mobility;Tone;Strength;IADL;Sensation;GMC;Gait;ROM;FMC;Flexibility;Pain;Vision;UE functional use;Endurance    Cognitive Skills Attention;Perception;Problem Solve;Safety Awareness;Thought    Rehab Potential Good    Clinical Decision Making Several treatment options, min-mod task modification necessary     Comorbidities Affecting Occupational Performance: None    Modification or Assistance to Complete Evaluation  Min-Moderate modification of tasks or assist with assess necessary to complete eval    OT Frequency 2x / week    OT Duration 12 weeks    OT Treatment/Interventions Self-care/ADL training;Ultrasound;Energy conservation;Visual/perceptual remediation/compensation;Patient/family education;DME and/or AE instruction;Aquatic Therapy;Paraffin;Gait Training;Passive range of motion;Balance training;Fluidtherapy;Electrical Stimulation;Functional Mobility Training;Splinting;Moist Heat;Therapeutic exercise;Manual Therapy;Cognitive remediation/compensation;Manual lymph drainage;Neuromuscular education;Coping strategies training    Plan Pt is transferring her care to [facility] as it is closer to home. She remains very fearful of moving RUE and responds well to heat, and estim.    Consulted and Agree with Plan of Care Patient             Patient will benefit from skilled therapeutic intervention in order to improve the following deficits and impairments:   Body Structure / Function / Physical Skills: ADL, Balance, Coordination, Decreased knowledge of precautions, Decreased knowledge of use of DME, Dexterity, Edema, Mobility, Tone, Strength, IADL, Sensation,  [place], Gait, ROM, FMC, Flexibility, Pain, Vision, UE functional use, Endurance Cognitive Skills: Attention, Perception, Problem Solve, Safety Awareness, Thought     Visit Diagnosis: Muscle weakness (generalized)  Other lack of coordination    Problem List Patient Active Problem List   Diagnosis Date Noted   Dyspareunia in female 04/05/2020   History of abnormal cervical Pap smear 03/10/2020   GIB (gastrointestinal bleeding) 02/28/2020   Elevated BUN    Prediabetes    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   Cerebral vasospasm    Sinus tachycardia    Dysphagia, post-stroke    Thrombocytopenia ([facility])    Acute blood loss anemia     Brain aneurysm    ICH (intracerebral hemorrhage) ([place]) 01/05/2020   History of adenomatous polyp of colon 02/07/2016   Anatomical narrow angle, 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

## 2021-05-22 ENCOUNTER — Encounter: Payer: Medicare PPO | Attending: Physical Medicine and Rehabilitation | Admitting: Psychology

## 2021-05-22 ENCOUNTER — Other Ambulatory Visit: Payer: Self-pay

## 2021-05-22 ENCOUNTER — Ambulatory Visit: Payer: Medicare PPO | Admitting: Occupational Therapy

## 2021-05-22 DIAGNOSIS — I69398 Other sequelae of cerebral infarction: Secondary | ICD-10-CM | POA: Diagnosis not present

## 2021-05-22 DIAGNOSIS — F411 Generalized anxiety disorder: Secondary | ICD-10-CM | POA: Insufficient documentation

## 2021-05-22 DIAGNOSIS — R252 Cramp and spasm: Secondary | ICD-10-CM | POA: Insufficient documentation

## 2021-05-22 DIAGNOSIS — I69819 Unspecified symptoms and signs involving cognitive functions following other cerebrovascular disease: Secondary | ICD-10-CM | POA: Diagnosis not present

## 2021-05-22 DIAGNOSIS — I607 Nontraumatic subarachnoid hemorrhage from unspecified intracranial artery: Secondary | ICD-10-CM | POA: Diagnosis not present

## 2021-05-23 ENCOUNTER — Ambulatory Visit: Payer: Medicare PPO | Admitting: Occupational Therapy

## 2021-05-23 ENCOUNTER — Encounter: Payer: Self-pay | Admitting: Psychology

## 2021-05-23 DIAGNOSIS — M6281 Muscle weakness (generalized): Secondary | ICD-10-CM | POA: Diagnosis not present

## 2021-05-23 DIAGNOSIS — R278 Other lack of coordination: Secondary | ICD-10-CM

## 2021-05-23 DIAGNOSIS — I69159 Hemiplegia and hemiparesis following nontraumatic intracerebral hemorrhage affecting unspecified side: Secondary | ICD-10-CM

## 2021-05-23 NOTE — Progress Notes (Signed)
Neuropsychological Consultation   Patient:   Erika Stanton   DOB:   Aug 21, 1955  MR Number:  [id number]  Location:  [facility] [place], [place] [id number] [place] [id number] Dept: [phone]           Date of Service:   05/22/2021  Start Time:   3 PM End Time:   4 PM  Provider/Observer:  [name], Psy.D.       Clinical Neuropsychologist       Billing Code/Service: 96158/[id number]  Chief Complaint:    Erika Stanton is a 66 year old female referred by Leeroy Cha, MD who is her treating physiatrist due to residual effects of cerebrovascular that occurred on 01/05/2020.  The patient initially developed right-sided headache and left hemiparesis.  CT head showed right basal ganglia hemorrhage with small amount of subarachnoid hemorrhage and bilateral IVH.  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  Patient underwent stent supported embolism of right ICA aneurysm on 01/06/2020.  Postoperative lethargy was noted.  EEG showed evidence of epileptogenic arising from right temporal region as well as severe diffuse encephalopathy but no seizures.  Patient became obtunded and follow-up MRI done revealing acute cortical and subcortical infarct affecting the left anterior frontal and left parietal regions as well as punctuate micro emboli in the right frontal and parietal regions.  Patient had initial significant cognitive deficits with disorientation, and unintelligible speech and bouts of lethargy.  Patient was followed up with CIR admission and I saw her 1 time on the unit.  Patient has been followed by physiatry and neurology as well as neurosurgery for coiling of other aneurysms and has a plan for revision of initial repair coming up.  Patient also has other significant medical issue related to ductal  carcinoma of the left breast and status postlumpectomy of the left breast.  05/10/2021: Today was her first follow-up therapeutic visit after a long delay due to scheduling issues.  The patient came in initially along with her husband and her husband was in the visit for the first 5 minutes and then left for [place] to continue individually.  The patient's husband reports that there continue to be a lot of stressors and conflicts that have developed since the patient's cerebrovascular event.  The patient's husband reports that he struggles to cope with the changes and limitations of his wife and how to most effectively communicate and help her and acknowledges a lot of stress between the 2 developing.  The patient acknowledges these issues.  During her individual interactions the patient acknowledges some of the frustrations that have been growing in the relationship with their attempts to cope and manage with her residual deficits.  The patient continues to struggle with her loss of motor function in her right arm and reports that she will have a very strong emotional reaction when she struggles and there are a lot of emotional distress issues coping.  05/22/2021: Today, the patient reports that there have been some significant improvements with how she and her husband are interacting.  We have talked specifically during her previous visit how to address some of the stressors they are experiencing with her changes in motor functioning particularly loss of right hand motor functioning and how both of them have become increasingly frustrated and defensive with each other.  The patient reports that  she actively applied some of the strategies we addressed and it is improved and reduce the number of conflicts and stress they have been experiencing.  Behavioral Observation: Erika Stanton  presents as a 66 y.o.-year-old Right Caucasian Female who appeared her stated age. her dress was Appropriate and she was Well Groomed  and her manners were Appropriate to the situation.  her participation was indicative of Appropriate and Attentive behaviors.  There were physical disabilities noted.  she displayed an appropriate level of cooperation and motivation.     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  Substance Use:  No concerns of substance abuse are reported.  Education:   College  Medical History:   Past Medical History:  Diagnosis Date   Aneurysm ([facility])    Melena          Patient Active Problem List   Diagnosis Date Noted   Dyspareunia in female 04/05/2020   History of abnormal cervical Pap smear 03/10/2020   GIB (gastrointestinal bleeding) 02/28/2020   Elevated BUN    Prediabetes    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[facility]) 01/20/2020   Cerebral vasospasm    Sinus tachycardia    Dysphagia, post-stroke    Thrombocytopenia ([facility])    Acute blood loss anemia    Brain aneurysm    ICH (intracerebral hemorrhage) ([place]) 01/05/2020   History of adenomatous polyp of colon 02/07/2016   Anatomical narrow angle, bilateral 12/14/2014   Fissure in ano 11/11/2014   Hemorrhoids 11/11/2014   Constipation 11/19/2013   GERD (gastroesophageal reflux disease) 03/24/2013   Irritable bowel syndrome with diarrhea 03/24/2013   Herpes zoster 05/14/2005   Abnormal Pap smear of cervix 05/15/1983   History of cervical dysplasia  05/15/1983         Psychiatric History:  No prior psychiatric history  Family Med/Psych History:  Family History  Problem Relation Age of Onset   Cancer Mother    Congestive Heart Failure Mother    Lung cancer Father    Lung cancer Sister     Impression/DX:  Erika Stanton is a 66 year old female referred by Leeroy Cha, MD who is her treating physiatrist due to residual effects of cerebrovascular that occurred on 01/05/2020.  The patient initially developed right-sided headache and left hemiparesis.  CT head showed right basal ganglia hemorrhage with small amount of subarachnoid hemorrhage and bilateral IVH.  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  Patient underwent stent supported embolism of right ICA aneurysm on 01/06/2020.  Postoperative lethargy was noted.  EEG showed evidence of epileptogenic arising from right temporal region as well as severe diffuse encephalopathy but no seizures.  Patient became obtunded and follow-up MRI done revealing acute cortical and subcortical infarct affecting the left anterior  frontal and left parietal regions as well as punctuate micro emboli in the right frontal and parietal regions.  Patient had initial significant cognitive deficits with disorientation, and unintelligible speech and bouts of lethargy.  Patient was followed up with CIR admission and I saw her 1 time on the unit.  Patient has been followed by physiatry and neurology as well as neurosurgery for coiling of other aneurysms and has a plan for revision of initial repair coming up.  Patient also has other significant medical issue related to ductal carcinoma of the left breast and status postlumpectomy of the left breast.  05/10/2021: Today was her first follow-up therapeutic visit after a long delay due to scheduling issues.  The patient came in initially along with her husband and her husband was in the visit  for the first 5 minutes and then left for [place] to continue individually.  The patient's husband reports that there continue to be a lot of stressors and conflicts that have developed since the patient's cerebrovascular event.  The patient's husband reports that he struggles to cope with the changes and limitations of his wife and how to most effectively communicate and help her and acknowledges a lot of stress between the 2 developing.  The patient acknowledges these issues.  During her individual interactions the patient acknowledges some of the frustrations that have been growing in the relationship with their attempts to cope and manage with her residual deficits.  The patient continues to struggle with her loss of motor function in her right arm and reports that she will have a very strong emotional reaction when she struggles and there are a lot of emotional distress issues coping.  05/22/2021: Today, the patient reports that there have been some significant improvements with how she and her husband are interacting.  We have talked specifically during her previous visit how to address some of the stressors they are experiencing with her changes in motor functioning particularly loss of right hand motor functioning and how both of them have become increasingly frustrated and defensive with each other.  The patient reports that she actively applied some of the strategies we addressed and it is improved and reduce the number of conflicts and stress they have been experiencing.  Disposition/Plan:  Today, we began working on therapeutic interventions particularly around issues of coping and adjustment with residual/late effects of her cerebrovascular accident.  The patient continues to have some cognitive issues particularly with regard to attention and concentration but a more significant difficulty right now is anxiety and depressive types of symptoms with her frustration regarding ongoing physical limitations and  the development of some conflicts between her and her husband where they are both having difficulty adjusting to changes in the patient.  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 Clinical Neuropsychologist

## 2021-05-25 ENCOUNTER — Ambulatory Visit: Payer: Medicare PPO | Admitting: Occupational Therapy

## 2021-05-27 ENCOUNTER — Encounter: Payer: Self-pay | Admitting: Occupational Therapy

## 2021-05-27 NOTE — Therapy (Signed)
[place] MAIN [facility] SERVICES [address] [place], [place], [address] Phone: [phone]   Fax:  [phone]  Occupational Therapy Treatment  Patient Details  Name: Erika Stanton MRN: [id number] Date of Birth: 11-Jun-1955 No data recorded  Encounter Date: 05/23/2021   OT End of Session - 05/27/21 1545     Visit Number 111    Number of Visits 148    Date for OT Re-Evaluation 08/21/21    Authorization Type Progress report period starting 01/26/2021    Authorization Time Period Medicare    OT Start Time 1105    OT Stop Time 1156    OT Time Calculation (min) 51 min    Activity Tolerance Patient tolerated treatment well    Behavior During Therapy [facility] for tasks assessed/performed             Past Medical History:  Diagnosis Date   Aneurysm ([place])    Melena     Past Surgical History:  Procedure Laterality Date   CHOLECYSTECTOMY     COLONOSCOPY  11/2017   at [facility]. no recurrent polyps.  suggest repeat surveillance study 11/2022.     COLONOSCOPY W/ POLYPECTOMY  06/2014   Dr Roxy Manns at [facility].  3 adenomatoous polyps, anal fissure.     ESOPHAGOGASTRODUODENOSCOPY (EGD) WITH PROPOFOL N/A 01/27/2020   Procedure: ESOPHAGOGASTRODUODENOSCOPY (EGD) WITH PROPOFOL;  Surgeon: Mauri Pole, MD;  Location: [facility];  Service: Endoscopy;  Laterality: N/A;   IR 3D INDEPENDENT WKST  01/06/2020   IR ANGIO INTRA EXTRACRAN SEL INTERNAL CAROTID BILAT MOD SED  01/06/2020   IR ANGIO VERTEBRAL SEL VERTEBRAL UNI L MOD SED  01/06/2020   IR ANGIOGRAM FOLLOW UP STUDY  01/06/2020   IR ANGIOGRAM FOLLOW UP STUDY  01/06/2020   IR ANGIOGRAM FOLLOW UP STUDY  01/06/2020   IR ANGIOGRAM FOLLOW UP STUDY  01/06/2020   IR ANGIOGRAM FOLLOW UP STUDY  01/06/2020   IR ANGIOGRAM FOLLOW UP STUDY  01/06/2020   IR ANGIOGRAM FOLLOW UP STUDY  01/06/2020   IR ANGIOGRAM FOLLOW UP STUDY  01/06/2020   IR ANGIOGRAM FOLLOW UP STUDY  01/06/2020   IR ANGIOGRAM FOLLOW UP STUDY  01/06/2020   IR  NEURO EACH ADD'L AFTER BASIC UNI RIGHT (MS)  01/06/2020   IR TRANSCATH/EMBOLIZ  01/06/2020   RADIOLOGY WITH ANESTHESIA N/A 01/06/2020   Procedure: IR WITH ANESTHESIA FOR ANEURYSM;  Surgeon: Consuella Lose, MD;  Location: [facility];  Service: Radiology;  Laterality: N/A;   TUBAL LIGATION      There were no vitals filed for this visit.   Subjective Assessment - 05/27/21 1543     Subjective  Pt reports she had a good time in [place] in Dec, is now officially retired and was able to get all of her insurance issues worked out and now on [employer].    Pertinent History ICH 01/06/20, Aneurysm. PMH: OA bilateral knees and hands    Patient Stated Goals Pt would like to be as independent as possible.    Currently in Pain? Yes    Pain Score 2     Pain Location Hand    Pain Orientation Right    Pain Descriptors / Indicators Aching;Sore    Pain Type Chronic pain            Moist heat to right hand, wrist and shoulder for 5 mins prior to therapeutic exercises to decrease pain, increase tissue mobility and improve ROM.  Therapeutic Exercise:   Pt seen  for right shoulder flexion to 100 degrees in sitting, ABD to 80, ER with prolonged stretching, elbow flexion/extension, wrist flexion/extension, and digit ROM.  Pt demonstrates increased extensor tone in the index and middle finger while ring and small finger demonstrates flexor tone.  Pt has pain at the base thumb at [facility] joint and has difficulty tolerating range of motion of the thumb and stretch to webspace, performed with slow, prolonged stretch.  Discussed the need to work on flexion of the Index and middle fingers to help facilitate use with grasping patterns.   Pt able to grossly pick up or hold larger items but items able to be presented to patient in a certain way for her to hold, she lacks any manipulation skills in right UE at this time.    Response to tx: Pt continues to demonstrate pain in right UE, appears to have arthritic changes to thumb  with pain and stiffness.  As noted above, she presents with index and middle finger extensor tightness with difficulty moving into flexion, while small finger and ring stay more in flexed position.  Instructed patient on range of motion frequently of right hand to improve mobility of digits for purposeful movements.  Continue to work towards goals in plan of care to maximize safety and independence in daily tasks.                        OT Education - 05/27/21 1545     Education Details range of motion of hand, working index and middle for flexion and ring and small for extension    Person(s) Educated Patient    Methods Explanation;Verbal cues;Demonstration    Comprehension Verbalized understanding;Returned demonstration;Tactile cues required;Verbal cues required;Need further instruction              OT Short Term Goals - 05/18/21 1418       OT SHORT TERM GOAL #4   Baseline Pt. presents with limited isolated  right shoulder flexion with pain continues to be 2. 08/08/20: 40* AROM with 3/10 pain; 03/07/21: Pt demos 75* active shoulder flexion with 2/10 pain or less. 40th visit: Pt. continues to present with limited shoulder ROM, and pain has decreased.    Time 4    Period Weeks    Status On-going    Target Date 08/21/21      OT SHORT TERM GOAL #5   Title Pt will demonstrate improved LUE fine motor coordination for ADLs as evidenced by decreasing 9 hole peg test score to 25 secs or less.    Baseline 10/31/2020: Left FMC: 32 sec. Left FMC 41 sec visa the 9 hole peg test. 08/08/20: 32 seconds via 9 hole peg 40th visit: Left hand: 35 sec.    Time 12    Period Weeks    Status On-going    Target Date 08/21/21               OT Long Term Goals - 05/18/21 1422       OT LONG TERM GOAL #1   Title Pt will perform all basic ADLs with supervision    Baseline 01/24/21 assist with bra 12/13/2020 unchanged 6/202/2022: Pt. is able to donn stretchy shirts, donn slide on shoes,  donning pants. Pt. has difficylty with managing a bra,shirt buttons, and pant zipper/button.Pt. requires minA from her husband following multiple recent surgeries. Pt. requires minA from her husband. 08/08/20: Pt continues to require MIN A for grooming and dressing tasks; 03/07/21: pt  performs UB/LB bathing and dressing with set up (assist with clothing fasteners); min A for grooming d/t unable to manage hair with 2 hands 40th visit: Pt. conitnues to require minA  for self-grooming, Pt. continues to perform UB/LB bathing/dressing, assist with fasteners    Time 12    Period Weeks    Status On-going    Target Date 08/21/21      OT LONG TERM GOAL #2   Title Pt will demonstrate 60* RUE shoulder flexion in prep for functional reach with pain less than or equal to 3/10.    Baseline 12/13/20:  shoulder flexion right 64 degrees 10/31/20: Pt. continues to present with limited AROM for isolated  right shoulder flexion with pain. 08/08/20: R AROM Shoulder flexion - 45* c 3/10 pain.    Time 12    Period Weeks    Status On-going    Target Date 08/21/21      OT LONG TERM GOAL #3   Title Pt will use RUE as a stabilizer/ gross A at least 30 % of the time for ADLs/ IADLs.    Baseline Pt. is using her right hand as a gross stabilizer 15% of the time. 08/08/20: requires MAX cues; 03/07/21 Pt using R hand as a gross stabilizer at least 25% of the time. 40th visit: Pt. has difficulty using her right hand  as a gross stabilizer.    Time 12    Period Weeks    Status On-going    Target Date 08/21/21      OT LONG TERM GOAL #4   Title Pt will increase LUE grip strength to 25 lbs or greater for increased ease with ADLs.    Baseline 12/13/20: 24# left, 1# right 10/31/2020: Pt. continues to present with limited left grip. Pt. has improved with left grip strength. Pt.continues to work towards 25#. 08/08/20: L grip 19#; 03/07/21: L grip 21 lbs (R 1 lb) 05/18/2021: Left grip strength: 30#    Time 12    Period Weeks    Status  On-going    Target Date 08/21/21      OT LONG TERM GOAL #5   Title 10/31/2020: Pt. is now perfroming laundry, loading the dishwasher, assists some with cooking, light meal preparation, setting the table    Baseline 12/13/2020:  continues to assist as needed but uses left hand .  10/13/20: Pt. assists with filling the dishwasher, and performing laundry. Pt. continues to try to help when she can, however her husband mostly performs these tasks. 08/08/20: Husband performs; 03/07/21: Pt assists with laundry, loading dishwasher, light cooking/meal prep; mostly using LUE. 05/18/2021: pt. continues to assist with laundry skills, loading the dishwasher, light cooking using the left hand    Time 12    Period Weeks    Status On-going    Target Date 08/21/21      OT LONG TERM GOAL #6   Title Pt will demonstrate ability to grasp/ release a cup 2/3 trials with RUE.    Baseline 12/13/20:  Continues to work on consistency with grasp and release 10/31/2020: Pt. continues to initiate grasp, and active release of objects, however is unable to grasp/release a cup. 08/08/20: Able to grasp, unable to release x1 trial; 03/05/21: Able to grasp/release cup with min A to keep cup from tipping over.05/18/2021: Pt. is unable to grasp items secondary to changes in the right hand following Botox injections. Pt. has 2nd,a nd 3rd digit PIP extensor tightness.    Time 12  Period Weeks    Status On-going    Target Date 08/21/21      OT LONG TERM GOAL #7   Title Pt will demonstrate the ability to place clothing on hangers with use of bilateral UE and modified technique.    Baseline unable; 03/07/21: min-mod A    Time 12    Period Weeks    Status On-going    Target Date 08/21/21      OT LONG TERM GOAL #8   Title I with updated HEP.    Baseline 12/13/2020: Continual changes to HEP 09/12/2020: Pt. continues to require assist for her HEP. Pt. requires assist, and cuing for HEP. 08/08/20: reports completing with husband assist for  technique; 03/07/21: min vc. 05/18/2021: Pt. requires assist with HEPs, Husband assists with HEP.    Time 12    Period Weeks    Status Partially Met    Target Date 08/21/21      OT LONG TERM GOAL  #9   TITLE Pt will demonstrate the ability to don and doff robe after shower with modified independence    Baseline unable; 03/07/21: pt reports she avoids her robe (too heavy); uses towel instead.    Time 12    Period Weeks    Status Deferred      OT LONG TERM GOAL  #10   TITLE Pt will demonstrate the ability to perform zippers with modified independence and use of adaptive equipment as needed.    Baseline difficulty; 03/07/21: cues to modify zippers with string loops (has not yet tried) 05/18/2021: TBD    Time 12    Period Weeks    Status On-going    Target Date 08/21/21                   Plan - 05/27/21 1547     Clinical Impression Statement Pt continues to demonstrate pain in right UE, appears to have arthritic changes to thumb with pain and stiffness.  As noted above, she presents with index and middle finger extensor tightness with difficulty moving into flexion, while small finger and ring stay more in flexed position.  Instructed patient on range of motion frequently of right hand to improve mobility of digits for purposeful movements.  Continue to work towards goals in plan of care to maximize safety and independence in daily tasks.    OT Occupational Profile and History Detailed Assessment- Review of Records and additional review of physical, cognitive, psychosocial history related to current functional performance    Occupational performance deficits (Please refer to evaluation for details): ADL's;IADL's;Rest and Sleep;Work;Play;Leisure    Body Structure / Function / Physical Skills ADL;Balance;Coordination;Decreased knowledge of precautions;Decreased knowledge of use of DME;Dexterity;Edema;Mobility;Tone;Strength;IADL;Sensation;GMC;Gait;ROM;FMC;Flexibility;Pain;Vision;UE  functional use;Endurance    Cognitive Skills Attention;Perception;Problem Solve;Safety Awareness;Thought    Rehab Potential Good    Clinical Decision Making Several treatment options, min-mod task modification necessary    Comorbidities Affecting Occupational Performance: None    Modification or Assistance to Complete Evaluation  Min-Moderate modification of tasks or assist with assess necessary to complete eval    OT Frequency 2x / week    OT Duration 12 weeks    OT Treatment/Interventions Self-care/ADL training;Ultrasound;Energy conservation;Visual/perceptual remediation/compensation;Patient/family education;DME and/or AE instruction;Aquatic Therapy;Paraffin;Gait Training;Passive range of motion;Balance training;Fluidtherapy;Electrical Stimulation;Functional Mobility Training;Splinting;Moist Heat;Therapeutic exercise;Manual Therapy;Cognitive remediation/compensation;Manual lymph drainage;Neuromuscular education;Coping strategies training    Plan Pt is transferring her care to [facility] as it is closer to home. She remains very fearful of moving RUE and responds well to  heat, and estim.    Consulted and Agree with Plan of Care Patient             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, unspecified laterality (HCC)    Problem List Patient Active Problem List   Diagnosis Date Noted   Dyspareunia in  female 04/05/2020   History of abnormal cervical Pap smear 03/10/2020   GIB (gastrointestinal bleeding) 02/28/2020   Elevated BUN    Prediabetes    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   Cerebral vasospasm    Sinus tachycardia    Dysphagia, post-stroke    Thrombocytopenia (HCC)    Acute blood loss anemia    Brain aneurysm    ICH (intracerebral hemorrhage) ([place]) 01/05/2020   History of adenomatous polyp of colon 02/07/2016   Anatomical narrow angle, 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 [place], [place], [address] Phone: [phone]   Fax:  [phone]  Name: Erika Stanton MRN: [id number] Date of Birth: [birth year]

## 2021-05-29 ENCOUNTER — Ambulatory Visit: Payer: Medicare PPO | Admitting: Occupational Therapy

## 2021-05-30 ENCOUNTER — Ambulatory Visit: Payer: Medicare PPO

## 2021-05-30 ENCOUNTER — Other Ambulatory Visit: Payer: Self-pay

## 2021-05-30 DIAGNOSIS — M6281 Muscle weakness (generalized): Secondary | ICD-10-CM | POA: Diagnosis not present

## 2021-05-30 DIAGNOSIS — R482 Apraxia: Secondary | ICD-10-CM

## 2021-05-30 DIAGNOSIS — R278 Other lack of coordination: Secondary | ICD-10-CM

## 2021-05-31 NOTE — Therapy (Signed)
[place] MAIN [facility] SERVICES [address] [place], [place], [address] Phone: [phone]   Fax:  [phone]  Occupational Therapy Treatment  Patient Details  Name: Erika Stanton MRN: [id number] Date of Birth: 1955-06-26 No data recorded  Encounter Date: 05/30/2021   OT End of Session - 05/31/21 0818     Visit Number 112    Number of Visits 148    Date for OT Re-Evaluation 08/21/21    Authorization Type Progress report period starting 01/26/2021    Authorization Time Period Medicare    OT Start Time 1255    OT Stop Time 1345    OT Time Calculation (min) 50 min    Activity Tolerance Patient tolerated treatment well    Behavior During Therapy [facility] for tasks assessed/performed             Past Medical History:  Diagnosis Date   Aneurysm ([place])    Melena     Past Surgical History:  Procedure Laterality Date   CHOLECYSTECTOMY     COLONOSCOPY  11/2017   at [facility]. no recurrent polyps.  suggest repeat surveillance study 11/2022.     COLONOSCOPY W/ POLYPECTOMY  06/2014   Dr Roxy Manns at [facility].  3 adenomatoous polyps, anal fissure.     ESOPHAGOGASTRODUODENOSCOPY (EGD) WITH PROPOFOL N/A 01/27/2020   Procedure: ESOPHAGOGASTRODUODENOSCOPY (EGD) WITH PROPOFOL;  Surgeon: Mauri Pole, MD;  Location: [facility];  Service: Endoscopy;  Laterality: N/A;   IR 3D INDEPENDENT WKST  01/06/2020   IR ANGIO INTRA EXTRACRAN SEL INTERNAL CAROTID BILAT MOD SED  01/06/2020   IR ANGIO VERTEBRAL SEL VERTEBRAL UNI L MOD SED  01/06/2020   IR ANGIOGRAM FOLLOW UP STUDY  01/06/2020   IR ANGIOGRAM FOLLOW UP STUDY  01/06/2020   IR ANGIOGRAM FOLLOW UP STUDY  01/06/2020   IR ANGIOGRAM FOLLOW UP STUDY  01/06/2020   IR ANGIOGRAM FOLLOW UP STUDY  01/06/2020   IR ANGIOGRAM FOLLOW UP STUDY  01/06/2020   IR ANGIOGRAM FOLLOW UP STUDY  01/06/2020   IR ANGIOGRAM FOLLOW UP STUDY  01/06/2020   IR ANGIOGRAM FOLLOW UP STUDY  01/06/2020   IR ANGIOGRAM FOLLOW UP STUDY  01/06/2020   IR  NEURO EACH ADD'L AFTER BASIC UNI RIGHT (MS)  01/06/2020   IR TRANSCATH/EMBOLIZ  01/06/2020   RADIOLOGY WITH ANESTHESIA N/A 01/06/2020   Procedure: IR WITH ANESTHESIA FOR ANEURYSM;  Surgeon: Consuella Lose, MD;  Location: [place];  Service: Radiology;  Laterality: N/A;   TUBAL LIGATION      There were no vitals filed for this visit.   Subjective Assessment - 05/30/21 0816     Subjective  "Pt reports she can't grip anything anymore since her recent Botox injections."    Patient is accompanied by: Family member    Pertinent History ICH 01/06/20, Aneurysm. PMH: OA bilateral knees and hands    Repetition Decreases Symptoms    Patient Stated Goals Pt would like to be as independent as possible.    Currently in Pain? Yes    Pain Score 2     Pain Location Hand    Pain Orientation Right    Pain Descriptors / Indicators Aching;Tightness;Sore    Pain Type Chronic pain    Pain Radiating Towards shoulder, hand    Pain Onset More than a month ago    Pain Frequency Intermittent    Aggravating Factors  activity    Pain Relieving Factors rest/heat/gentle stretch    Effect of Pain on  Daily Activities decreased activity tolerance when using RUE    Multiple Pain Sites No    Pain Onset More than a month ago            Occupational Therapy Treatment: Moist heat applied to R shoulder and hand x 5 min for pain reduction/muscle relaxation in prep for manual therapy and therapeutic exercise.   Manual Therapy: Gentle passive stretching performed to R shoulder for flexion, abd, ER with grade 4 joint mobilization, working to increase shoulder mobility for functional reaching during self care.  Performed passive digit ROM to R hand with focus on ext of 4th and 5th digits, abd/add of thumb, and flexion of 2nd and 3rd digits.  Used small ball in palm of hand to work towards closing hand around ball.  Issued small stress ball for pt to carry over at home.    Therapeutic Exercise: Used ACE wrap to keep hand  closed around ball for prolonged gentle stretch (~10 min) during AAROM for R shoulder for flex/abd/horiz abd/add, and ER x2 sets 10 reps each.  Good tolerance to hand closed around ball.  Removed ACE wrap and performed light squeezes with stress ball for gripping x2 sets 10 reps for R hand strengthening.    Response to Treatment: See Plan/clinical impression below.    OT Education - 05/30/21 0817     Education Details self passive stretching with R hand using stress ball in palm    Person(s) Educated Patient    Methods Explanation;Verbal cues;Demonstration    Comprehension Verbalized understanding;Returned demonstration;Tactile cues required;Verbal cues required;Need further instruction              OT Short Term Goals - 05/18/21 1418       OT SHORT TERM GOAL #4   Baseline Pt. presents with limited isolated  right shoulder flexion with pain continues to be 2. 08/08/20: 40* AROM with 3/10 pain; 03/07/21: Pt demos 75* active shoulder flexion with 2/10 pain or less. 40th visit: Pt. continues to present with limited shoulder ROM, and pain has decreased.    Time 4    Period Weeks    Status On-going    Target Date 08/21/21      OT SHORT TERM GOAL #5   Title Pt will demonstrate improved LUE fine motor coordination for ADLs as evidenced by decreasing 9 hole peg test score to 25 secs or less.    Baseline 10/31/2020: Left FMC: 32 sec. Left FMC 41 sec visa the 9 hole peg test. 08/08/20: 32 seconds via 9 hole peg 40th visit: Left hand: 35 sec.    Time 12    Period Weeks    Status On-going    Target Date 08/21/21               OT Long Term Goals - 05/18/21 1422       OT LONG TERM GOAL #1   Title Pt will perform all basic ADLs with supervision    Baseline 01/24/21 assist with bra 12/13/2020 unchanged 6/202/2022: Pt. is able to donn stretchy shirts, donn slide on shoes, donning pants. Pt. has difficylty with managing a bra,shirt buttons, and pant zipper/button.Pt. requires minA from her  husband following multiple recent surgeries. Pt. requires minA from her husband. 08/08/20: Pt continues to require MIN A for grooming and dressing tasks; 03/07/21: pt performs UB/LB bathing and dressing with set up (assist with clothing fasteners); min A for grooming d/t unable to manage hair with 2 hands 40th visit: Pt. conitnues  to require minA  for self-grooming, Pt. continues to perform UB/LB bathing/dressing, assist with fasteners    Time 12    Period Weeks    Status On-going    Target Date 08/21/21      OT LONG TERM GOAL #2   Title Pt will demonstrate 60* RUE shoulder flexion in prep for functional reach with pain less than or equal to 3/10.    Baseline 12/13/20:  shoulder flexion right 64 degrees 10/31/20: Pt. continues to present with limited AROM for isolated  right shoulder flexion with pain. 08/08/20: R AROM Shoulder flexion - 45* c 3/10 pain.    Time 12    Period Weeks    Status On-going    Target Date 08/21/21      OT LONG TERM GOAL #3   Title Pt will use RUE as a stabilizer/ gross A at least 30 % of the time for ADLs/ IADLs.    Baseline Pt. is using her right hand as a gross stabilizer 15% of the time. 08/08/20: requires MAX cues; 03/07/21 Pt using R hand as a gross stabilizer at least 25% of the time. 40th visit: Pt. has difficulty using her right hand  as a gross stabilizer.    Time 12    Period Weeks    Status On-going    Target Date 08/21/21      OT LONG TERM GOAL #4   Title Pt will increase LUE grip strength to 25 lbs or greater for increased ease with ADLs.    Baseline 12/13/20: 24# left, 1# right 10/31/2020: Pt. continues to present with limited left grip. Pt. has improved with left grip strength. Pt.continues to work towards 25#. 08/08/20: L grip 19#; 03/07/21: L grip 21 lbs (R 1 lb) 05/18/2021: Left grip strength: 30#    Time 12    Period Weeks    Status On-going    Target Date 08/21/21      OT LONG TERM GOAL #5   Title 10/31/2020: Pt. is now perfroming laundry, loading the  dishwasher, assists some with cooking, light meal preparation, setting the table    Baseline 12/13/2020:  continues to assist as needed but uses left hand .  10/13/20: Pt. assists with filling the dishwasher, and performing laundry. Pt. continues to try to help when she can, however her husband mostly performs these tasks. 08/08/20: Husband performs; 03/07/21: Pt assists with laundry, loading dishwasher, light cooking/meal prep; mostly using LUE. 05/18/2021: pt. continues to assist with laundry skills, loading the dishwasher, light cooking using the left hand    Time 12    Period Weeks    Status On-going    Target Date 08/21/21      OT LONG TERM GOAL #6   Title Pt will demonstrate ability to grasp/ release a cup 2/3 trials with RUE.    Baseline 12/13/20:  Continues to work on consistency with grasp and release 10/31/2020: Pt. continues to initiate grasp, and active release of objects, however is unable to grasp/release a cup. 08/08/20: Able to grasp, unable to release x1 trial; 03/05/21: Able to grasp/release cup with min A to keep cup from tipping over.05/18/2021: Pt. is unable to grasp items secondary to changes in the right hand following Botox injections. Pt. has 2nd,a nd 3rd digit PIP extensor tightness.    Time 12    Period Weeks    Status On-going    Target Date 08/21/21      OT LONG TERM GOAL #7   Title Pt  will demonstrate the ability to place clothing on hangers with use of bilateral UE and modified technique.    Baseline unable; 03/07/21: min-mod A    Time 12    Period Weeks    Status On-going    Target Date 08/21/21      OT LONG TERM GOAL #8   Title I with updated HEP.    Baseline 12/13/2020: Continual changes to HEP 09/12/2020: Pt. continues to require assist for her HEP. Pt. requires assist, and cuing for HEP. 08/08/20: reports completing with husband assist for technique; 03/07/21: min vc. 05/18/2021: Pt. requires assist with HEPs, Husband assists with HEP.    Time 12    Period Weeks     Status Partially Met    Target Date 08/21/21      OT LONG TERM GOAL  #9   TITLE Pt will demonstrate the ability to don and doff robe after shower with modified independence    Baseline unable; 03/07/21: pt reports she avoids her robe (too heavy); uses towel instead.    Time 12    Period Weeks    Status Deferred      OT LONG TERM GOAL  #10   TITLE Pt will demonstrate the ability to perform zippers with modified independence and use of adaptive equipment as needed.    Baseline difficulty; 03/07/21: cues to modify zippers with string loops (has not yet tried) 05/18/2021: TBD    Time 12    Period Weeks    Status On-going    Target Date 08/21/21               Plan - 05/30/21 0902     Clinical Impression Statement Pt presents with index and middle finger extensor tightness with difficulty moving into flexion, while small finger and ring stay more in flexed position.  Instructed patient on range of motion frequently of right hand to improve mobility of digits for purposeful movements and issued stress ball for stretching R hand over ball for prolonged stretching at home.  Continue to work towards goals in plan of care to maximize safety and independence in daily tasks.    OT Occupational Profile and History Detailed Assessment- Review of Records and additional review of physical, cognitive, psychosocial history related to current functional performance    Occupational performance deficits (Please refer to evaluation for details): ADL's;IADL's;Rest and Sleep;Work;Play;Leisure    Body Structure / Function / Physical Skills ADL;Balance;Coordination;Decreased knowledge of precautions;Decreased knowledge of use of DME;Dexterity;Edema;Mobility;Tone;Strength;IADL;Sensation;GMC;Gait;ROM;FMC;Flexibility;Pain;Vision;UE functional use;Endurance    Cognitive Skills Attention;Perception;Problem Solve;Safety Awareness;Thought    Rehab Potential Good    Clinical Decision Making Several treatment options,  min-mod task modification necessary    Comorbidities Affecting Occupational Performance: None    Modification or Assistance to Complete Evaluation  Min-Moderate modification of tasks or assist with assess necessary to complete eval    OT Frequency 2x / week    OT Duration 12 weeks    OT Treatment/Interventions Self-care/ADL training;Ultrasound;Energy conservation;Visual/perceptual remediation/compensation;Patient/family education;DME and/or AE instruction;Aquatic Therapy;Paraffin;Gait Training;Passive range of motion;Balance training;Fluidtherapy;Electrical Stimulation;Functional Mobility Training;Splinting;Moist Heat;Therapeutic exercise;Manual Therapy;Cognitive remediation/compensation;Manual lymph drainage;Neuromuscular education;Coping strategies training    Plan Pt is transferring her care to [facility] as it is closer to home. She remains very fearful of moving RUE and responds well to heat, and estim.    Consulted and Agree with Plan of Care Patient             Patient will benefit from skilled therapeutic intervention in order to improve the following  deficits and impairments:   Body Structure / Function / Physical Skills: ADL, Balance, Coordination, Decreased knowledge of precautions, Decreased knowledge of use of DME, Dexterity, Edema, Mobility, Tone, Strength, IADL, Sensation, GMC, Gait, ROM, FMC, Flexibility, Pain, Vision, UE functional use, Endurance Cognitive Skills: Attention, Perception, Problem Solve, Safety Awareness, Thought     Visit Diagnosis: Apraxia  Other lack of coordination  Muscle weakness (generalized)    Problem List Patient Active Problem List   Diagnosis Date Noted   Dyspareunia in female 04/05/2020   History of abnormal cervical Pap smear 03/10/2020   GIB (gastrointestinal bleeding) 02/28/2020   Elevated BUN    Prediabetes    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   Cerebral vasospasm    Sinus tachycardia    Dysphagia, post-stroke     Thrombocytopenia ([facility])    Acute blood loss anemia    Brain aneurysm    ICH (intracerebral hemorrhage) ([place]) 01/05/2020   History of adenomatous polyp of colon 02/07/2016   Anatomical narrow angle, 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

## 2021-06-01 ENCOUNTER — Ambulatory Visit: Payer: Medicare PPO

## 2021-06-05 ENCOUNTER — Ambulatory Visit: Payer: Medicare PPO | Admitting: Occupational Therapy

## 2021-06-06 ENCOUNTER — Ambulatory Visit: Payer: Medicare PPO | Admitting: Occupational Therapy

## 2021-06-08 ENCOUNTER — Other Ambulatory Visit: Payer: Self-pay

## 2021-06-08 ENCOUNTER — Ambulatory Visit: Payer: Medicare PPO | Admitting: Occupational Therapy

## 2021-06-08 DIAGNOSIS — M6281 Muscle weakness (generalized): Secondary | ICD-10-CM

## 2021-06-08 DIAGNOSIS — I69159 Hemiplegia and hemiparesis following nontraumatic intracerebral hemorrhage affecting unspecified side: Secondary | ICD-10-CM

## 2021-06-08 DIAGNOSIS — R278 Other lack of coordination: Secondary | ICD-10-CM

## 2021-06-08 NOTE — Therapy (Signed)
[facility] SERVICES [address] [place], [place], [address] Phone: [phone]   Fax:  [phone]  Occupational Therapy Treatment  Patient Details  Name: Erika Stanton MRN: [id number] Date of Birth: 1955/09/29 No data recorded  Encounter Date: 06/08/2021   OT End of Session - 06/08/21 1634     Visit Number 113    Number of Visits 148    Date for OT Re-Evaluation 08/21/21    OT Start Time 1302    OT Stop Time 1350    OT Time Calculation (min) 48 min    Activity Tolerance Patient tolerated treatment well    Behavior During Therapy [facility] for tasks assessed/performed             Past Medical History:  Diagnosis Date   Aneurysm ([place])    Melena     Past Surgical History:  Procedure Laterality Date   CHOLECYSTECTOMY     COLONOSCOPY  11/2017   at [facility]. no recurrent polyps.  suggest repeat surveillance study 11/2022.     COLONOSCOPY W/ POLYPECTOMY  06/2014   Dr Roxy Manns at [facility].  3 adenomatoous polyps, anal fissure.     ESOPHAGOGASTRODUODENOSCOPY (EGD) WITH PROPOFOL N/A 01/27/2020   Procedure: ESOPHAGOGASTRODUODENOSCOPY (EGD) WITH PROPOFOL;  Surgeon: Mauri Pole, MD;  Location: [place] [facility];  Service: Endoscopy;  Laterality: N/A;   IR 3D INDEPENDENT WKST  01/06/2020   IR ANGIO INTRA EXTRACRAN SEL INTERNAL CAROTID BILAT MOD SED  01/06/2020   IR ANGIO VERTEBRAL SEL VERTEBRAL UNI L MOD SED  01/06/2020   IR ANGIOGRAM FOLLOW UP STUDY  01/06/2020   IR ANGIOGRAM FOLLOW UP STUDY  01/06/2020   IR ANGIOGRAM FOLLOW UP STUDY  01/06/2020   IR ANGIOGRAM FOLLOW UP STUDY  01/06/2020   IR ANGIOGRAM FOLLOW UP STUDY  01/06/2020   IR ANGIOGRAM FOLLOW UP STUDY  01/06/2020   IR ANGIOGRAM FOLLOW UP STUDY  01/06/2020   IR ANGIOGRAM FOLLOW UP STUDY  01/06/2020   IR ANGIOGRAM FOLLOW UP STUDY  01/06/2020   IR ANGIOGRAM FOLLOW UP STUDY  01/06/2020   IR NEURO EACH ADD'L AFTER BASIC UNI RIGHT (MS)  01/06/2020   IR TRANSCATH/EMBOLIZ  01/06/2020   RADIOLOGY  WITH ANESTHESIA N/A 01/06/2020   Procedure: IR WITH ANESTHESIA FOR ANEURYSM;  Surgeon: Consuella Lose, MD;  Location: [place];  Service: Radiology;  Laterality: N/A;   TUBAL LIGATION      There were no vitals filed for this visit.   Subjective Assessment - 06/08/21 1632     Subjective  Pt reports she doesn't feel her index and middle finger on the right responded to Botox injections, she states, "I feel like my fingers are worse, I can't bend them at all now, they didn't relax"    Patient is accompanied by: Family member    Pertinent History ICH 01/06/20, Aneurysm. PMH: OA bilateral knees and hands    Repetition Decreases Symptoms    Patient Stated Goals Pt would like to be as independent as possible.    Currently in Pain? Yes    Pain Score 4     Pain Location Hand    Pain Orientation Right    Pain Descriptors / Indicators Aching;Tightness    Pain Type Chronic pain    Pain Onset More than a month ago    Pain Frequency Intermittent    Aggravating Factors  with attempts at finger flexion    Pain Onset More than a month ago  Occupational Therapy Treatment:  Moist heat applied to R shoulder and hand x 5 min for pain reduction/muscle relaxation prior to therapeutic exercise.    Therapeutic Exercise:  Performed passive digit ROM to R hand with focus on ext of 4th and 5th digits, abd/add of thumb, and flexion of 2nd and 3rd digits.  Pt unable to actively flexion 2nd and 3rd digit and complains of increased pain with any passive flexion at the PIP joint.  Slow, prolonged stretching and incorporated use of ball to mold fingers around.  Encouraged pt to focus on ROM for flexion in these digits with prolonged stretch and position in slight finger flexion if she is able.  Ring and small fingers tend to stay in flexion, therapist performed passive stretches for extension then followed by active flexion/extension of ring and small.  AAROM for shoulder flexion to 90 degrees, ABD to  90, ER with prolonged stretch, active elbow flexion/extension.    Attempts to pick up variety sized balls, unable to flex at index and middle finger on right but able to pick up larger items with fingers extended and grossly grasp/pinching.      Response to tx: Pt continues to demonstrate increased extensor tone in right index and middle fingers while ring and small fingers remain in flexion most of the time.  She has limited movement in her thumb at MCP and IP joints most likely related to arthritis which she had limitations prior to her CVA.  Will consider trying custom splint to block MP flexion and target and isolate for PIP flexion next session.  Will also plan to use paraffin for deep heat prior to exercises to increase tissue mobility, decrease pain and increase active/passive motion.  Continue to work towards goals in plan of care to maximize safety and independence in daily tasks.                        OT Education - 06/08/21 1634     Education Details continued focus on passive stretching of right index and middle fingers into flexion.    Person(s) Educated Patient    Methods Explanation;Verbal cues;Demonstration    Comprehension Verbalized understanding;Returned demonstration;Tactile cues required;Verbal cues required;Need further instruction              OT Short Term Goals - 05/18/21 1418       OT SHORT TERM GOAL #4   Baseline Pt. presents with limited isolated  right shoulder flexion with pain continues to be 2. 08/08/20: 40* AROM with 3/10 pain; 03/07/21: Pt demos 75* active shoulder flexion with 2/10 pain or less. 40th visit: Pt. continues to present with limited shoulder ROM, and pain has decreased.    Time 4    Period Weeks    Status On-going    Target Date 08/21/21      OT SHORT TERM GOAL #5   Title Pt will demonstrate improved LUE fine motor coordination for ADLs as evidenced by decreasing 9 hole peg test score to 25 secs or less.    Baseline  10/31/2020: Left FMC: 32 sec. Left FMC 41 sec visa the 9 hole peg test. 08/08/20: 32 seconds via 9 hole peg 40th visit: Left hand: 35 sec.    Time 12    Period Weeks    Status On-going    Target Date 08/21/21               OT Long Term Goals - 05/18/21 1422  OT LONG TERM GOAL #1   Title Pt will perform all basic ADLs with supervision    Baseline 01/24/21 assist with bra 12/13/2020 unchanged 6/202/2022: Pt. is able to donn stretchy shirts, donn slide on shoes, donning pants. Pt. has difficylty with managing a bra,shirt buttons, and pant zipper/button.Pt. requires minA from her husband following multiple recent surgeries. Pt. requires minA from her husband. 08/08/20: Pt continues to require MIN A for grooming and dressing tasks; 03/07/21: pt performs UB/LB bathing and dressing with set up (assist with clothing fasteners); min A for grooming d/t unable to manage hair with 2 hands 40th visit: Pt. conitnues to require minA  for self-grooming, Pt. continues to perform UB/LB bathing/dressing, assist with fasteners    Time 12    Period Weeks    Status On-going    Target Date 08/21/21      OT LONG TERM GOAL #2   Title Pt will demonstrate 60* RUE shoulder flexion in prep for functional reach with pain less than or equal to 3/10.    Baseline 12/13/20:  shoulder flexion right 64 degrees 10/31/20: Pt. continues to present with limited AROM for isolated  right shoulder flexion with pain. 08/08/20: R AROM Shoulder flexion - 45* c 3/10 pain.    Time 12    Period Weeks    Status On-going    Target Date 08/21/21      OT LONG TERM GOAL #3   Title Pt will use RUE as a stabilizer/ gross A at least 30 % of the time for ADLs/ IADLs.    Baseline Pt. is using her right hand as a gross stabilizer 15% of the time. 08/08/20: requires MAX cues; 03/07/21 Pt using R hand as a gross stabilizer at least 25% of the time. 40th visit: Pt. has difficulty using her right hand  as a gross stabilizer.    Time 12    Period  Weeks    Status On-going    Target Date 08/21/21      OT LONG TERM GOAL #4   Title Pt will increase LUE grip strength to 25 lbs or greater for increased ease with ADLs.    Baseline 12/13/20: 24# left, 1# right 10/31/2020: Pt. continues to present with limited left grip. Pt. has improved with left grip strength. Pt.continues to work towards 25#. 08/08/20: L grip 19#; 03/07/21: L grip 21 lbs (R 1 lb) 05/18/2021: Left grip strength: 30#    Time 12    Period Weeks    Status On-going    Target Date 08/21/21      OT LONG TERM GOAL #5   Title 10/31/2020: Pt. is now perfroming laundry, loading the dishwasher, assists some with cooking, light meal preparation, setting the table    Baseline 12/13/2020:  continues to assist as needed but uses left hand .  10/13/20: Pt. assists with filling the dishwasher, and performing laundry. Pt. continues to try to help when she can, however her husband mostly performs these tasks. 08/08/20: Husband performs; 03/07/21: Pt assists with laundry, loading dishwasher, light cooking/meal prep; mostly using LUE. 05/18/2021: pt. continues to assist with laundry skills, loading the dishwasher, light cooking using the left hand    Time 12    Period Weeks    Status On-going    Target Date 08/21/21      OT LONG TERM GOAL #6   Title Pt will demonstrate ability to grasp/ release a cup 2/3 trials with RUE.    Baseline 12/13/20:  Continues to  work on consistency with grasp and release 10/31/2020: Pt. continues to initiate grasp, and active release of objects, however is unable to grasp/release a cup. 08/08/20: Able to grasp, unable to release x1 trial; 03/05/21: Able to grasp/release cup with min A to keep cup from tipping over.05/18/2021: Pt. is unable to grasp items secondary to changes in the right hand following Botox injections. Pt. has 2nd,a nd 3rd digit PIP extensor tightness.    Time 12    Period Weeks    Status On-going    Target Date 08/21/21      OT LONG TERM GOAL #7   Title Pt will  demonstrate the ability to place clothing on hangers with use of bilateral UE and modified technique.    Baseline unable; 03/07/21: min-mod A    Time 12    Period Weeks    Status On-going    Target Date 08/21/21      OT LONG TERM GOAL #8   Title I with updated HEP.    Baseline 12/13/2020: Continual changes to HEP 09/12/2020: Pt. continues to require assist for her HEP. Pt. requires assist, and cuing for HEP. 08/08/20: reports completing with husband assist for technique; 03/07/21: min vc. 05/18/2021: Pt. requires assist with HEPs, Husband assists with HEP.    Time 12    Period Weeks    Status Partially Met    Target Date 08/21/21      OT LONG TERM GOAL  #9   TITLE Pt will demonstrate the ability to don and doff robe after shower with modified independence    Baseline unable; 03/07/21: pt reports she avoids her robe (too heavy); uses towel instead.    Time 12    Period Weeks    Status Deferred      OT LONG TERM GOAL  #10   TITLE Pt will demonstrate the ability to perform zippers with modified independence and use of adaptive equipment as needed.    Baseline difficulty; 03/07/21: cues to modify zippers with string loops (has not yet tried) 05/18/2021: TBD    Time 12    Period Weeks    Status On-going    Target Date 08/21/21                   Plan - 06/08/21 1635     Clinical Impression Statement Pt continues to demonstrate increased extensor tone in right index and middle fingers while ring and small fingers remain in flexion most of the time.  She has limited movement in her thumb at MCP and IP joints most likely related to arthritis which she had limitations prior to her CVA.  Will consider trying custom splint to block MP flexion and target and isolate for PIP flexion next session.  Will also plan to use paraffin for deep heat prior to exercises to increase tissue mobility, decrease pain and increase active/passive motion.  Continue to work towards goals in plan of care to  maximize safety and independence in daily tasks.    OT Occupational Profile and History Detailed Assessment- Review of Records and additional review of physical, cognitive, psychosocial history related to current functional performance    Occupational performance deficits (Please refer to evaluation for details): ADL's;IADL's;Rest and Sleep;Work;Play;Leisure    Body Structure / Function / Physical Skills ADL;Balance;Coordination;Decreased knowledge of precautions;Decreased knowledge of use of DME;Dexterity;Edema;Mobility;Tone;Strength;IADL;Sensation;GMC;Gait;ROM;FMC;Flexibility;Pain;Vision;UE functional use;Endurance    Cognitive Skills Attention;Perception;Problem Solve;Safety Awareness;Thought    Rehab Potential Good    Clinical Decision Making Several treatment options, min-mod  task modification necessary    Comorbidities Affecting Occupational Performance: None    Modification or Assistance to Complete Evaluation  Min-Moderate modification of tasks or assist with assess necessary to complete eval    OT Frequency 2x / week    OT Duration 12 weeks    OT Treatment/Interventions Self-care/ADL training;Ultrasound;Energy conservation;Visual/perceptual remediation/compensation;Patient/family education;DME and/or AE instruction;Aquatic Therapy;Paraffin;Gait Training;Passive range of motion;Balance training;Fluidtherapy;Electrical Stimulation;Functional Mobility Training;Splinting;Moist Heat;Therapeutic exercise;Manual Therapy;Cognitive remediation/compensation;Manual lymph drainage;Neuromuscular education;Coping strategies training    Plan Pt is transferring her care to [facility] OP as it is closer to home. She remains very fearful of moving RUE and responds well to heat, and estim.    Consulted and Agree with Plan of Care Patient             Patient will benefit from skilled therapeutic intervention in order to improve the following deficits and impairments:   Body Structure / Function / Physical  Skills: ADL, Balance, Coordination, Decreased knowledge of precautions, Decreased knowledge of use of DME, Dexterity, Edema, Mobility, Tone, Strength, IADL, Sensation, GMC, Gait, ROM, FMC, Flexibility, Pain, Vision, UE functional use, Endurance Cognitive Skills: Attention, Perception, Problem Solve, Safety Awareness, Thought     Visit Diagnosis: Muscle weakness (generalized)  Other lack of coordination  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, unspecified laterality (HCC)    Problem List Patient Active Problem List   Diagnosis Date Noted   Dyspareunia in female 04/05/2020   History of abnormal cervical Pap smear 03/10/2020   GIB (gastrointestinal bleeding) 02/28/2020   Elevated BUN    Prediabetes    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   Cerebral vasospasm    Sinus tachycardia    Dysphagia, post-stroke    Thrombocytopenia (HCC)    Acute blood loss anemia    Brain aneurysm    ICH (intracerebral hemorrhage) ([place]) 01/05/2020   History of adenomatous polyp of colon 02/07/2016   Anatomical narrow angle, 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

## 2021-06-12 ENCOUNTER — Ambulatory Visit: Payer: Medicare PPO | Admitting: Occupational Therapy

## 2021-06-13 ENCOUNTER — Other Ambulatory Visit: Payer: Self-pay

## 2021-06-13 ENCOUNTER — Ambulatory Visit: Payer: Medicare PPO | Admitting: Occupational Therapy

## 2021-06-13 ENCOUNTER — Encounter: Payer: Self-pay | Admitting: Occupational Therapy

## 2021-06-13 DIAGNOSIS — I69159 Hemiplegia and hemiparesis following nontraumatic intracerebral hemorrhage affecting unspecified side: Secondary | ICD-10-CM

## 2021-06-13 DIAGNOSIS — R278 Other lack of coordination: Secondary | ICD-10-CM

## 2021-06-13 DIAGNOSIS — M6281 Muscle weakness (generalized): Secondary | ICD-10-CM

## 2021-06-13 NOTE — Therapy (Signed)
[place] MAIN [facility] SERVICES [address] [place], [place], [address] Phone: [phone]   Fax:  [phone]  Occupational Therapy Treatment  Patient Details  Name: Erika Stanton MRN: [id number] Date of Birth: 06-21-1955 No data recorded  Encounter Date: 06/13/2021   OT End of Session - 06/13/21 2029     Visit Number 114    Number of Visits 148    Date for OT Re-Evaluation 08/21/21    OT Start Time 1300    OT Stop Time 1350    OT Time Calculation (min) 50 min    Activity Tolerance Patient tolerated treatment well    Behavior During Therapy [facility] for tasks assessed/performed             Past Medical History:  Diagnosis Date   Aneurysm ([place])    Melena     Past Surgical History:  Procedure Laterality Date   CHOLECYSTECTOMY     COLONOSCOPY  11/2017   at [facility]. no recurrent polyps.  suggest repeat surveillance study 11/2022.     COLONOSCOPY W/ POLYPECTOMY  06/2014   Dr Roxy Manns at [facility].  3 adenomatoous polyps, anal fissure.     ESOPHAGOGASTRODUODENOSCOPY (EGD) WITH PROPOFOL N/A 01/27/2020   Procedure: ESOPHAGOGASTRODUODENOSCOPY (EGD) WITH PROPOFOL;  Surgeon: Mauri Pole, MD;  Location: [facility];  Service: Endoscopy;  Laterality: N/A;   IR 3D INDEPENDENT WKST  01/06/2020   IR ANGIO INTRA EXTRACRAN SEL INTERNAL CAROTID BILAT MOD SED  01/06/2020   IR ANGIO VERTEBRAL SEL VERTEBRAL UNI L MOD SED  01/06/2020   IR ANGIOGRAM FOLLOW UP STUDY  01/06/2020   IR ANGIOGRAM FOLLOW UP STUDY  01/06/2020   IR ANGIOGRAM FOLLOW UP STUDY  01/06/2020   IR ANGIOGRAM FOLLOW UP STUDY  01/06/2020   IR ANGIOGRAM FOLLOW UP STUDY  01/06/2020   IR ANGIOGRAM FOLLOW UP STUDY  01/06/2020   IR ANGIOGRAM FOLLOW UP STUDY  01/06/2020   IR ANGIOGRAM FOLLOW UP STUDY  01/06/2020   IR ANGIOGRAM FOLLOW UP STUDY  01/06/2020   IR ANGIOGRAM FOLLOW UP STUDY  01/06/2020   IR NEURO EACH ADD'L AFTER BASIC UNI RIGHT (MS)  01/06/2020   IR TRANSCATH/EMBOLIZ  01/06/2020   RADIOLOGY  WITH ANESTHESIA N/A 01/06/2020   Procedure: IR WITH ANESTHESIA FOR ANEURYSM;  Surgeon: Consuella Lose, MD;  Location: [place];  Service: Radiology;  Laterality: N/A;   TUBAL LIGATION      There were no vitals filed for this visit.   Subjective Assessment - 06/13/21 1750     Subjective  Pt reports she is doing well, has been working on bending the fingers at home but still has pain with attempts to manage index and middle fingers.    Patient is accompanied by: Family member    Pertinent History ICH 01/06/20, Aneurysm. PMH: OA bilateral knees and hands    Repetition Decreases Symptoms    Patient Stated Goals Pt would like to be as independent as possible.    Currently in Pain? Yes    Pain Score 4     Pain Location Hand    Pain Orientation Right    Pain Descriptors / Indicators Aching;Sore;Tingling    Pain Type Chronic pain    Pain Onset More than a month ago    Pain Frequency Intermittent    Pain Onset More than a month ago             Pt seen for use of paraffin bath to right hand  and wrist, applied for 10 mins to decrease pain, increase tissue mobility and increase ROM. Skin inspected prior to and post treatment with no issues.   Following paraffin, pt was seen for RUE PROM to hand and digits with prolonged stretching to index and middle finger for flexion, focused on PIP flexion, fabricated small hand splint from thermodynamic plastic to block MP flexion and target PIP flexion.  Decreased active motion and required assist for placing on index and middle fingers in to splint and assist through the motion.  Ring and small finger extension stretches and working to facilitate active movement for grasp and release patterns.  AAROM to right shoulder for flexion and ABD.   Response to tx: Pt continues to demonstrate increased tightness in right index and middle finger with limitations in motion for flexion.  Improved ROM this date after [place] of paraffin.  Limited with grasp and release  patterns to pick up items with right hand. Will continue to focus on motion to decrease further risk of contractures and to work towards using right UE as a gross assist.  Continue to work with blocking motion of MPs while increasing PIP ROM to promote grasping/holding of objects.  Continue to work towards goals to improve right UE function, decrease pain and increase motion.                       OT Education - 06/13/21 2028     Education Details continued focus on passive stretching of right index and middle fingers into flexion.  use of paraffin    Person(s) Educated Patient    Methods Explanation;Verbal cues;Demonstration    Comprehension Verbalized understanding;Returned demonstration;Tactile cues required;Verbal cues required;Need further instruction              OT Short Term Goals - 05/18/21 1418       OT SHORT TERM GOAL #4   Baseline Pt. presents with limited isolated  right shoulder flexion with pain continues to be 2. 08/08/20: 40* AROM with 3/10 pain; 03/07/21: Pt demos 75* active shoulder flexion with 2/10 pain or less. 40th visit: Pt. continues to present with limited shoulder ROM, and pain has decreased.    Time 4    Period Weeks    Status On-going    Target Date 08/21/21      OT SHORT TERM GOAL #5   Title Pt will demonstrate improved LUE fine motor coordination for ADLs as evidenced by decreasing 9 hole peg test score to 25 secs or less.    Baseline 10/31/2020: Left FMC: 32 sec. Left FMC 41 sec visa the 9 hole peg test. 08/08/20: 32 seconds via 9 hole peg 40th visit: Left hand: 35 sec.    Time 12    Period Weeks    Status On-going    Target Date 08/21/21               OT Long Term Goals - 05/18/21 1422       OT LONG TERM GOAL #1   Title Pt will perform all basic ADLs with supervision    Baseline 01/24/21 assist with bra 12/13/2020 unchanged 6/202/2022: Pt. is able to donn stretchy shirts, donn slide on shoes, donning pants. Pt. has difficylty  with managing a bra,shirt buttons, and pant zipper/button.Pt. requires minA from her husband following multiple recent surgeries. Pt. requires minA from her husband. 08/08/20: Pt continues to require MIN A for grooming and dressing tasks; 03/07/21: pt performs UB/LB bathing and  dressing with set up (assist with clothing fasteners); min A for grooming d/t unable to manage hair with 2 hands 40th visit: Pt. conitnues to require minA  for self-grooming, Pt. continues to perform UB/LB bathing/dressing, assist with fasteners    Time 12    Period Weeks    Status On-going    Target Date 08/21/21      OT LONG TERM GOAL #2   Title Pt will demonstrate 60* RUE shoulder flexion in prep for functional reach with pain less than or equal to 3/10.    Baseline 12/13/20:  shoulder flexion right 64 degrees 10/31/20: Pt. continues to present with limited AROM for isolated  right shoulder flexion with pain. 08/08/20: R AROM Shoulder flexion - 45* c 3/10 pain.    Time 12    Period Weeks    Status On-going    Target Date 08/21/21      OT LONG TERM GOAL #3   Title Pt will use RUE as a stabilizer/ gross A at least 30 % of the time for ADLs/ IADLs.    Baseline Pt. is using her right hand as a gross stabilizer 15% of the time. 08/08/20: requires MAX cues; 03/07/21 Pt using R hand as a gross stabilizer at least 25% of the time. 40th visit: Pt. has difficulty using her right hand  as a gross stabilizer.    Time 12    Period Weeks    Status On-going    Target Date 08/21/21      OT LONG TERM GOAL #4   Title Pt will increase LUE grip strength to 25 lbs or greater for increased ease with ADLs.    Baseline 12/13/20: 24# left, 1# right 10/31/2020: Pt. continues to present with limited left grip. Pt. has improved with left grip strength. Pt.continues to work towards 25#. 08/08/20: L grip 19#; 03/07/21: L grip 21 lbs (R 1 lb) 05/18/2021: Left grip strength: 30#    Time 12    Period Weeks    Status On-going    Target Date 08/21/21       OT LONG TERM GOAL #5   Title 10/31/2020: Pt. is now perfroming laundry, loading the dishwasher, assists some with cooking, light meal preparation, setting the table    Baseline 12/13/2020:  continues to assist as needed but uses left hand .  10/13/20: Pt. assists with filling the dishwasher, and performing laundry. Pt. continues to try to help when she can, however her husband mostly performs these tasks. 08/08/20: Husband performs; 03/07/21: Pt assists with laundry, loading dishwasher, light cooking/meal prep; mostly using LUE. 05/18/2021: pt. continues to assist with laundry skills, loading the dishwasher, light cooking using the left hand    Time 12    Period Weeks    Status On-going    Target Date 08/21/21      OT LONG TERM GOAL #6   Title Pt will demonstrate ability to grasp/ release a cup 2/3 trials with RUE.    Baseline 12/13/20:  Continues to work on consistency with grasp and release 10/31/2020: Pt. continues to initiate grasp, and active release of objects, however is unable to grasp/release a cup. 08/08/20: Able to grasp, unable to release x1 trial; 03/05/21: Able to grasp/release cup with min A to keep cup from tipping over.05/18/2021: Pt. is unable to grasp items secondary to changes in the right hand following Botox injections. Pt. has 2nd,a nd 3rd digit PIP extensor tightness.    Time 12    Period Weeks  Status On-going    Target Date 08/21/21      OT LONG TERM GOAL #7   Title Pt will demonstrate the ability to place clothing on hangers with use of bilateral UE and modified technique.    Baseline unable; 03/07/21: min-mod A    Time 12    Period Weeks    Status On-going    Target Date 08/21/21      OT LONG TERM GOAL #8   Title I with updated HEP.    Baseline 12/13/2020: Continual changes to HEP 09/12/2020: Pt. continues to require assist for her HEP. Pt. requires assist, and cuing for HEP. 08/08/20: reports completing with husband assist for technique; 03/07/21: min vc. 05/18/2021: Pt.  requires assist with HEPs, Husband assists with HEP.    Time 12    Period Weeks    Status Partially Met    Target Date 08/21/21      OT LONG TERM GOAL  #9   TITLE Pt will demonstrate the ability to don and doff robe after shower with modified independence    Baseline unable; 03/07/21: pt reports she avoids her robe (too heavy); uses towel instead.    Time 12    Period Weeks    Status Deferred      OT LONG TERM GOAL  #10   TITLE Pt will demonstrate the ability to perform zippers with modified independence and use of adaptive equipment as needed.    Baseline difficulty; 03/07/21: cues to modify zippers with string loops (has not yet tried) 05/18/2021: TBD    Time 12    Period Weeks    Status On-going    Target Date 08/21/21                   Plan - 06/13/21 2029     Clinical Impression Statement Pt continues to demonstrate increased tightness in right index and middle finger with limitations in motion for flexion.  Improved ROM this date after [place] of paraffin.  Limited with grasp and release patterns to pick up items with right hand. Will continue to focus on motion to decrease further risk of contractures and to work towards using right UE as a gross assist.  Continue to work with blocking motion of MPs while increasing PIP ROM to promote grasping/holding of objects.  Continue to work towards goals to improve right UE function, decrease pain and increase motion.    OT Occupational Profile and History Detailed Assessment- Review of Records and additional review of physical, cognitive, psychosocial history related to current functional performance    Occupational performance deficits (Please refer to evaluation for details): ADL's;IADL's;Rest and Sleep;Work;Play;Leisure    Body Structure / Function / Physical Skills ADL;Balance;Coordination;Decreased knowledge of precautions;Decreased knowledge of use of  DME;Dexterity;Edema;Mobility;Tone;Strength;IADL;Sensation;GMC;Gait;ROM;FMC;Flexibility;Pain;Vision;UE functional use;Endurance    Cognitive Skills Attention;Perception;Problem Solve;Safety Awareness;Thought    Rehab Potential Good    Clinical Decision Making Several treatment options, min-mod task modification necessary    Comorbidities Affecting Occupational Performance: None    Modification or Assistance to Complete Evaluation  Min-Moderate modification of tasks or assist with assess necessary to complete eval    OT Frequency 2x / week    OT Duration 12 weeks    OT Treatment/Interventions Self-care/ADL training;Ultrasound;Energy conservation;Visual/perceptual remediation/compensation;Patient/family education;DME and/or AE instruction;Aquatic Therapy;Paraffin;Gait Training;Passive range of motion;Balance training;Fluidtherapy;Electrical Stimulation;Functional Mobility Training;Splinting;Moist Heat;Therapeutic exercise;Manual Therapy;Cognitive remediation/compensation;Manual lymph drainage;Neuromuscular education;Coping strategies training    Plan Pt is transferring her care to [facility] as it is closer to home. She remains  very fearful of moving RUE and responds well to heat, and estim.    Consulted and Agree with Plan of Care Patient             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, unspecified  laterality (HCC)    Problem List Patient Active Problem List   Diagnosis Date Noted   Dyspareunia in female 04/05/2020   History of abnormal cervical Pap smear 03/10/2020   GIB (gastrointestinal bleeding) 02/28/2020   Elevated BUN    Prediabetes    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   Cerebral vasospasm    Sinus tachycardia    Dysphagia, post-stroke    Thrombocytopenia (HCC)    Acute blood loss anemia    Brain aneurysm    ICH (intracerebral hemorrhage) ([place]) 01/05/2020   History of adenomatous polyp of colon 02/07/2016   Anatomical narrow angle, 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

## 2021-06-15 ENCOUNTER — Encounter: Payer: Self-pay | Admitting: Occupational Therapy

## 2021-06-15 ENCOUNTER — Other Ambulatory Visit: Payer: Self-pay

## 2021-06-15 ENCOUNTER — Ambulatory Visit: Payer: Medicare PPO | Attending: Physical Medicine & Rehabilitation | Admitting: Occupational Therapy

## 2021-06-15 DIAGNOSIS — R482 Apraxia: Secondary | ICD-10-CM | POA: Diagnosis present

## 2021-06-15 DIAGNOSIS — M25511 Pain in right shoulder: Secondary | ICD-10-CM | POA: Diagnosis present

## 2021-06-15 DIAGNOSIS — M6281 Muscle weakness (generalized): Secondary | ICD-10-CM | POA: Diagnosis not present

## 2021-06-15 DIAGNOSIS — M79641 Pain in right hand: Secondary | ICD-10-CM | POA: Diagnosis present

## 2021-06-15 DIAGNOSIS — I69159 Hemiplegia and hemiparesis following nontraumatic intracerebral hemorrhage affecting unspecified side: Secondary | ICD-10-CM | POA: Diagnosis present

## 2021-06-15 DIAGNOSIS — R278 Other lack of coordination: Secondary | ICD-10-CM | POA: Diagnosis present

## 2021-06-15 NOTE — Therapy (Signed)
[place] MAIN [facility] SERVICES [address] [place], [place], [address] Phone: [phone]   Fax:  [phone]  Occupational Therapy Treatment  Patient Details  Name: Erika Stanton MRN: [id number] Date of Birth: 02-26-1956 No data recorded  Encounter Date: 06/15/2021   OT End of Session - 06/23/21 1929     Visit Number 115    Number of Visits 148    Date for OT Re-Evaluation 08/21/21    OT Start Time 1259    OT Stop Time 1345    OT Time Calculation (min) 46 min    Activity Tolerance Patient tolerated treatment well    Behavior During Therapy [facility] for tasks assessed/performed              Past Medical History:  Diagnosis Date   Aneurysm ([place])    Melena     Past Surgical History:  Procedure Laterality Date   CHOLECYSTECTOMY     COLONOSCOPY  11/2017   at [facility]. no recurrent polyps.  suggest repeat surveillance study 11/2022.     COLONOSCOPY W/ POLYPECTOMY  06/2014   Dr Roxy Manns at [facility].  3 adenomatoous polyps, anal fissure.     ESOPHAGOGASTRODUODENOSCOPY (EGD) WITH PROPOFOL N/A 01/27/2020   Procedure: ESOPHAGOGASTRODUODENOSCOPY (EGD) WITH PROPOFOL;  Surgeon: Mauri Pole, MD;  Location: [place] ENDOSCOPY;  Service: Endoscopy;  Laterality: N/A;   IR 3D INDEPENDENT WKST  01/06/2020   IR ANGIO INTRA EXTRACRAN SEL INTERNAL CAROTID BILAT MOD SED  01/06/2020   IR ANGIO VERTEBRAL SEL VERTEBRAL UNI L MOD SED  01/06/2020   IR ANGIOGRAM FOLLOW UP STUDY  01/06/2020   IR ANGIOGRAM FOLLOW UP STUDY  01/06/2020   IR ANGIOGRAM FOLLOW UP STUDY  01/06/2020   IR ANGIOGRAM FOLLOW UP STUDY  01/06/2020   IR ANGIOGRAM FOLLOW UP STUDY  01/06/2020   IR ANGIOGRAM FOLLOW UP STUDY  01/06/2020   IR ANGIOGRAM FOLLOW UP STUDY  01/06/2020   IR ANGIOGRAM FOLLOW UP STUDY  01/06/2020   IR ANGIOGRAM FOLLOW UP STUDY  01/06/2020   IR ANGIOGRAM FOLLOW UP STUDY  01/06/2020   IR NEURO EACH ADD'L AFTER BASIC UNI RIGHT (MS)  01/06/2020   IR TRANSCATH/EMBOLIZ  01/06/2020   RADIOLOGY  WITH ANESTHESIA N/A 01/06/2020   Procedure: IR WITH ANESTHESIA FOR ANEURYSM;  Surgeon: Consuella Lose, MD;  Location: [place];  Service: Radiology;  Laterality: N/A;   TUBAL LIGATION      There were no vitals filed for this visit.   Subjective Assessment - 06/23/21 1929     Subjective  Pt reports her sister is coming to visit next week as long as the weather is good.  Pain in bilateral knees today, 3/10.    Patient is accompanied by: Family member    Pertinent History ICH 01/06/20, Aneurysm. PMH: OA bilateral knees and hands    Repetition Decreases Symptoms    Patient Stated Goals Pt would like to be as independent as possible.    Currently in Pain? Yes    Pain Score 3     Pain Location Knee    Pain Orientation Right;Left    Pain Descriptors / Indicators Aching;Sore    Pain Onset More than a month ago    Pain Onset More than a month ago             Pt reports she is excited about her sister coming soon to visit.  She reports her sister is planning to help her sort through her  totes with collectibles to get rid of things, she is unable to do this herself due to decreased use of right hand, unable to lift or move boxes.    Pt seen for paraffin bath to right hand and digits to increase tissue mobility, decrease pain and increase range of motion.  Performed prior to therapeutic exercises.  Pt dipping hand 5-6 times, use of cling wrap over hand and towel to keep heat in.   Therapeutic Exercises:   Pt seen for PROM of right hand, wrist, forearm and shoulder with prolonged stretching to right index and middle fingers for flexion and ring and small finger for extension.  Pt limited by pain in right hand especially with attempts for flexion of digits.  Pt instructed on prolonged stretching and positioning of hand to promote increased flexion.  Attempted grasp and release of objects, pt able to grasp larger objects 2 inch in size with gross grasping pattern, could not flex at index and middle.   Therapist attempting to help "wrap" digits around object with difficulty.    Response to tx: Pt continues to report pain with ROM exercises of right hand and digits.  Pt's index and middle finger stays in extension and demonstrates increased tightness with any attempts at flexion.  She responds best to slow prolonged stretch with distraction from activity of possible.  Reports paraffin felt hot today, although the temp was measured prior to tx and within the range.  Will adjust next session and see if she is able to tolerate the deep heat from paraffin.  Continue to reinforce HEP and daily ROM of RUE.                     OT Education - 06/23/21 1929     Education Details ROM of digits at home, use of heat    Person(s) Educated Patient    Methods Explanation;Verbal cues;Demonstration    Comprehension Verbalized understanding;Returned demonstration;Tactile cues required;Verbal cues required;Need further instruction              OT Short Term Goals - 05/18/21 1418       OT SHORT TERM GOAL #4   Baseline Pt. presents with limited isolated  right shoulder flexion with pain continues to be 2. 08/08/20: 40* AROM with 3/10 pain; 03/07/21: Pt demos 75* active shoulder flexion with 2/10 pain or less. 40th visit: Pt. continues to present with limited shoulder ROM, and pain has decreased.    Time 4    Period Weeks    Status On-going    Target Date 08/21/21      OT SHORT TERM GOAL #5   Title Pt will demonstrate improved LUE fine motor coordination for ADLs as evidenced by decreasing 9 hole peg test score to 25 secs or less.    Baseline 10/31/2020: Left FMC: 32 sec. Left FMC 41 sec visa the 9 hole peg test. 08/08/20: 32 seconds via 9 hole peg 40th visit: Left hand: 35 sec.    Time 12    Period Weeks    Status On-going    Target Date 08/21/21               OT Long Term Goals - 05/18/21 1422       OT LONG TERM GOAL #1   Title Pt will perform all basic ADLs with  supervision    Baseline 01/24/21 assist with bra 12/13/2020 unchanged 6/202/2022: Pt. is able to donn stretchy shirts, donn slide on shoes, donning  pants. Pt. has difficylty with managing a bra,shirt buttons, and pant zipper/button.Pt. requires minA from her husband following multiple recent surgeries. Pt. requires minA from her husband. 08/08/20: Pt continues to require MIN A for grooming and dressing tasks; 03/07/21: pt performs UB/LB bathing and dressing with set up (assist with clothing fasteners); min A for grooming d/t unable to manage hair with 2 hands 40th visit: Pt. conitnues to require minA  for self-grooming, Pt. continues to perform UB/LB bathing/dressing, assist with fasteners    Time 12    Period Weeks    Status On-going    Target Date 08/21/21      OT LONG TERM GOAL #2   Title Pt will demonstrate 60* RUE shoulder flexion in prep for functional reach with pain less than or equal to 3/10.    Baseline 12/13/20:  shoulder flexion right 64 degrees 10/31/20: Pt. continues to present with limited AROM for isolated  right shoulder flexion with pain. 08/08/20: R AROM Shoulder flexion - 45* c 3/10 pain.    Time 12    Period Weeks    Status On-going    Target Date 08/21/21      OT LONG TERM GOAL #3   Title Pt will use RUE as a stabilizer/ gross A at least 30 % of the time for ADLs/ IADLs.    Baseline Pt. is using her right hand as a gross stabilizer 15% of the time. 08/08/20: requires MAX cues; 03/07/21 Pt using R hand as a gross stabilizer at least 25% of the time. 40th visit: Pt. has difficulty using her right hand  as a gross stabilizer.    Time 12    Period Weeks    Status On-going    Target Date 08/21/21      OT LONG TERM GOAL #4   Title Pt will increase LUE grip strength to 25 lbs or greater for increased ease with ADLs.    Baseline 12/13/20: 24# left, 1# right 10/31/2020: Pt. continues to present with limited left grip. Pt. has improved with left grip strength. Pt.continues to work towards  25#. 08/08/20: L grip 19#; 03/07/21: L grip 21 lbs (R 1 lb) 05/18/2021: Left grip strength: 30#    Time 12    Period Weeks    Status On-going    Target Date 08/21/21      OT LONG TERM GOAL #5   Title 10/31/2020: Pt. is now perfroming laundry, loading the dishwasher, assists some with cooking, light meal preparation, setting the table    Baseline 12/13/2020:  continues to assist as needed but uses left hand .  10/13/20: Pt. assists with filling the dishwasher, and performing laundry. Pt. continues to try to help when she can, however her husband mostly performs these tasks. 08/08/20: Husband performs; 03/07/21: Pt assists with laundry, loading dishwasher, light cooking/meal prep; mostly using LUE. 05/18/2021: pt. continues to assist with laundry skills, loading the dishwasher, light cooking using the left hand    Time 12    Period Weeks    Status On-going    Target Date 08/21/21      OT LONG TERM GOAL #6   Title Pt will demonstrate ability to grasp/ release a cup 2/3 trials with RUE.    Baseline 12/13/20:  Continues to work on consistency with grasp and release 10/31/2020: Pt. continues to initiate grasp, and active release of objects, however is unable to grasp/release a cup. 08/08/20: Able to grasp, unable to release x1 trial; 03/05/21: Able to grasp/release cup  with min A to keep cup from tipping over.05/18/2021: Pt. is unable to grasp items secondary to changes in the right hand following Botox injections. Pt. has 2nd,a nd 3rd digit PIP extensor tightness.    Time 12    Period Weeks    Status On-going    Target Date 08/21/21      OT LONG TERM GOAL #7   Title Pt will demonstrate the ability to place clothing on hangers with use of bilateral UE and modified technique.    Baseline unable; 03/07/21: min-mod A    Time 12    Period Weeks    Status On-going    Target Date 08/21/21      OT LONG TERM GOAL #8   Title I with updated HEP.    Baseline 12/13/2020: Continual changes to HEP 09/12/2020: Pt.  continues to require assist for her HEP. Pt. requires assist, and cuing for HEP. 08/08/20: reports completing with husband assist for technique; 03/07/21: min vc. 05/18/2021: Pt. requires assist with HEPs, Husband assists with HEP.    Time 12    Period Weeks    Status Partially Met    Target Date 08/21/21      OT LONG TERM GOAL  #9   TITLE Pt will demonstrate the ability to don and doff robe after shower with modified independence    Baseline unable; 03/07/21: pt reports she avoids her robe (too heavy); uses towel instead.    Time 12    Period Weeks    Status Deferred      OT LONG TERM GOAL  #10   TITLE Pt will demonstrate the ability to perform zippers with modified independence and use of adaptive equipment as needed.    Baseline difficulty; 03/07/21: cues to modify zippers with string loops (has not yet tried) 05/18/2021: TBD    Time 12    Period Weeks    Status On-going    Target Date 08/21/21                   Plan - 06/23/21 1930     Clinical Impression Statement Pt continues to report pain with ROM exercises of right hand and digits.  Pt's index and middle finger stays in extension and demonstrates increased tightness with any attempts at flexion.  She responds best to slow prolonged stretch with distraction from activity of possible.  Reports paraffin felt hot today, although the temp was measured prior to tx and within the range.  Will adjust next session and see if she is able to tolerate the deep heat from paraffin.  Continue to reinforce HEP and daily ROM of RUE.    OT Occupational Profile and History Detailed Assessment- Review of Records and additional review of physical, cognitive, psychosocial history related to current functional performance    Occupational performance deficits (Please refer to evaluation for details): ADL's;IADL's;Rest and Sleep;Work;Play;Leisure    Body Structure / Function / Physical Skills ADL;Balance;Coordination;Decreased knowledge of  precautions;Decreased knowledge of use of DME;Dexterity;Edema;Mobility;Tone;Strength;IADL;Sensation;GMC;Gait;ROM;FMC;Flexibility;Pain;Vision;UE functional use;Endurance    Cognitive Skills Attention;Perception;Problem Solve;Safety Awareness;Thought    Rehab Potential Good    Clinical Decision Making Several treatment options, min-mod task modification necessary    Comorbidities Affecting Occupational Performance: None    Modification or Assistance to Complete Evaluation  Min-Moderate modification of tasks or assist with assess necessary to complete eval    OT Frequency 2x / week    OT Duration 12 weeks    OT Treatment/Interventions Self-care/ADL training;Ultrasound;Energy conservation;Visual/perceptual remediation/compensation;Patient/family education;DME and/or  AE instruction;Aquatic Therapy;Paraffin;Gait Training;Passive range of motion;Balance training;Fluidtherapy;Electrical Stimulation;Functional Mobility Training;Splinting;Moist Heat;Therapeutic exercise;Manual Therapy;Cognitive remediation/compensation;Manual lymph drainage;Neuromuscular education;Coping strategies training    Plan Pt is transferring her care to [facility] as it is closer to home. She remains very fearful of moving RUE and responds well to heat, and estim.    Consulted and Agree with Plan of Care Patient              Patient will benefit from skilled therapeutic intervention in order to improve the following deficits and impairments:   Body Structure / Function / Physical Skills: ADL, Balance, Coordination, Decreased knowledge of precautions, Decreased knowledge of use of DME, Dexterity, Edema, Mobility, Tone, Strength, IADL, Sensation, GMC, Gait, ROM, FMC, Flexibility, Pain, Vision, UE functional use, Endurance Cognitive Skills: Attention, Perception, Problem Solve, Safety Awareness, Thought     Visit Diagnosis: Muscle weakness (generalized)  Other lack of coordination  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, unspecified laterality (HCC)    Problem List Patient Active Problem List   Diagnosis Date Noted   Dyspareunia in female 04/05/2020   History of abnormal cervical Pap smear 03/10/2020   GIB (gastrointestinal bleeding) 02/28/2020   Elevated BUN    Prediabetes    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   Cerebral vasospasm    Sinus tachycardia    Dysphagia, post-stroke    Thrombocytopenia (HCC)    Acute blood loss anemia    Brain aneurysm    ICH (intracerebral hemorrhage) ([place]) 01/05/2020   History of adenomatous polyp of colon 02/07/2016   Anatomical narrow angle, 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 [place], [place], [address] Phone: [phone]   Fax:  [phone]  Name: KINDELL STRADA MRN: [id number] Date of Birth: [birth year]

## 2021-06-19 ENCOUNTER — Ambulatory Visit: Payer: Medicare PPO | Admitting: Occupational Therapy

## 2021-06-20 ENCOUNTER — Other Ambulatory Visit: Payer: Self-pay

## 2021-06-20 ENCOUNTER — Ambulatory Visit: Payer: Medicare PPO | Admitting: Occupational Therapy

## 2021-06-20 DIAGNOSIS — I69159 Hemiplegia and hemiparesis following nontraumatic intracerebral hemorrhage affecting unspecified side: Secondary | ICD-10-CM

## 2021-06-20 DIAGNOSIS — M6281 Muscle weakness (generalized): Secondary | ICD-10-CM

## 2021-06-20 DIAGNOSIS — M79641 Pain in right hand: Secondary | ICD-10-CM

## 2021-06-20 DIAGNOSIS — M25511 Pain in right shoulder: Secondary | ICD-10-CM

## 2021-06-20 DIAGNOSIS — R278 Other lack of coordination: Secondary | ICD-10-CM

## 2021-06-22 ENCOUNTER — Other Ambulatory Visit: Payer: Self-pay

## 2021-06-22 ENCOUNTER — Ambulatory Visit: Payer: Medicare PPO | Admitting: Occupational Therapy

## 2021-06-22 DIAGNOSIS — I69159 Hemiplegia and hemiparesis following nontraumatic intracerebral hemorrhage affecting unspecified side: Secondary | ICD-10-CM

## 2021-06-22 DIAGNOSIS — M79641 Pain in right hand: Secondary | ICD-10-CM

## 2021-06-22 DIAGNOSIS — R278 Other lack of coordination: Secondary | ICD-10-CM

## 2021-06-22 DIAGNOSIS — M6281 Muscle weakness (generalized): Secondary | ICD-10-CM

## 2021-06-22 DIAGNOSIS — M25511 Pain in right shoulder: Secondary | ICD-10-CM

## 2021-06-24 ENCOUNTER — Encounter: Payer: Self-pay | Admitting: Occupational Therapy

## 2021-06-24 NOTE — Therapy (Signed)
[place] MAIN [facility] SERVICES [address] [place], [place], [address] Phone: [phone]   Fax:  [phone]  Occupational Therapy Treatment  Patient Details  Name: Erika Stanton MRN: [id number] Date of Birth: 1955/08/10 No data recorded  Encounter Date: 06/22/2021   OT End of Session - 06/24/21 2048     Visit Number 117    Number of Visits 148    Date for OT Re-Evaluation 08/21/21    OT Start Time 1301    OT Stop Time 1345    OT Time Calculation (min) 44 min    Activity Tolerance Patient tolerated treatment well    Behavior During Therapy [facility] for tasks assessed/performed             Past Medical History:  Diagnosis Date   Aneurysm ([place])    Melena     Past Surgical History:  Procedure Laterality Date   CHOLECYSTECTOMY     COLONOSCOPY  11/2017   at [facility]. no recurrent polyps.  suggest repeat surveillance study 11/2022.     COLONOSCOPY W/ POLYPECTOMY  06/2014   Dr Roxy Manns at [facility].  3 adenomatoous polyps, anal fissure.     ESOPHAGOGASTRODUODENOSCOPY (EGD) WITH PROPOFOL N/A 01/27/2020   Procedure: ESOPHAGOGASTRODUODENOSCOPY (EGD) WITH PROPOFOL;  Surgeon: Mauri Pole, MD;  Location: [facility];  Service: Endoscopy;  Laterality: N/A;   IR 3D INDEPENDENT WKST  01/06/2020   IR ANGIO INTRA EXTRACRAN SEL INTERNAL CAROTID BILAT MOD SED  01/06/2020   IR ANGIO VERTEBRAL SEL VERTEBRAL UNI L MOD SED  01/06/2020   IR ANGIOGRAM FOLLOW UP STUDY  01/06/2020   IR ANGIOGRAM FOLLOW UP STUDY  01/06/2020   IR ANGIOGRAM FOLLOW UP STUDY  01/06/2020   IR ANGIOGRAM FOLLOW UP STUDY  01/06/2020   IR ANGIOGRAM FOLLOW UP STUDY  01/06/2020   IR ANGIOGRAM FOLLOW UP STUDY  01/06/2020   IR ANGIOGRAM FOLLOW UP STUDY  01/06/2020   IR ANGIOGRAM FOLLOW UP STUDY  01/06/2020   IR ANGIOGRAM FOLLOW UP STUDY  01/06/2020   IR ANGIOGRAM FOLLOW UP STUDY  01/06/2020   IR NEURO EACH ADD'L AFTER BASIC UNI RIGHT (MS)  01/06/2020   IR TRANSCATH/EMBOLIZ  01/06/2020   RADIOLOGY  WITH ANESTHESIA N/A 01/06/2020   Procedure: IR WITH ANESTHESIA FOR ANEURYSM;  Surgeon: Consuella Lose, MD;  Location: [place];  Service: Radiology;  Laterality: N/A;   TUBAL LIGATION      There were no vitals filed for this visit.   Subjective Assessment - 06/24/21 2046     Subjective  Pt reports her sister is here visiting, they are planning to work on sorting out her collectibles and trying to downsize at her house.  Pt is unable to lift or manage storage totes and requires assist for task.    Pertinent History ICH 01/06/20, Aneurysm. PMH: OA bilateral knees and hands    Patient Stated Goals Pt would like to be as independent as possible.    Currently in Pain? Yes    Pain Score 3     Pain Location Hand    Pain Orientation Right    Pain Descriptors / Indicators Aching;Sharp    Pain Type Chronic pain    Pain Onset More than a month ago    Pain Frequency Intermittent            Moist heat to right shoulder and hand, pain 0 at rest at hand, but 3/10 shoulder pain with movement, unable to put hair in  ponytail.   Therapeutic Exercises:   Pt seen for PROM of right hand, wrist, forearm and shoulder with prolonged stretching to right index and middle fingers for flexion and ring and small finger for extension.  Pt continues to be limited by pain in right hand especially with attempts for flexion of digits.  Prolonged stretching to right index and middle finger for flexion, ring and small finger with extension.  Pt still not able to demonstrate active flexion of index and middle to hold objects with any precision.  Shoulder ROM in sitting for shoulder flexion, ABD, ER, elbow flexion/extension, supination/pronation, wrist flexion/extension with therapist assist.    Response to tx: Pt remains limited with ROM both active and passive in right hand especially with index and middle finger.  Discussed in detail the importance of daily ROM and pt admits she does not push motion at home as therapist  does in the clinic.  Continue to work towards goals and reinforce HEP to make an impact on motion.                        OT Education - 06/24/21 2048     Education Details Positioning to encourage ROM, prolonged stretch    Person(s) Educated Patient    Methods Explanation;Verbal cues;Demonstration    Comprehension Verbalized understanding;Returned demonstration;Tactile cues required;Verbal cues required;Need further instruction              OT Short Term Goals - 05/18/21 1418       OT SHORT TERM GOAL #4   Baseline Pt. presents with limited isolated  right shoulder flexion with pain continues to be 2. 08/08/20: 40* AROM with 3/10 pain; 03/07/21: Pt demos 75* active shoulder flexion with 2/10 pain or less. 40th visit: Pt. continues to present with limited shoulder ROM, and pain has decreased.    Time 4    Period Weeks    Status On-going    Target Date 08/21/21      OT SHORT TERM GOAL #5   Title Pt will demonstrate improved LUE fine motor coordination for ADLs as evidenced by decreasing 9 hole peg test score to 25 secs or less.    Baseline 10/31/2020: Left FMC: 32 sec. Left FMC 41 sec visa the 9 hole peg test. 08/08/20: 32 seconds via 9 hole peg 40th visit: Left hand: 35 sec.    Time 12    Period Weeks    Status On-going    Target Date 08/21/21               OT Long Term Goals - 05/18/21 1422       OT LONG TERM GOAL #1   Title Pt will perform all basic ADLs with supervision    Baseline 01/24/21 assist with bra 12/13/2020 unchanged 6/202/2022: Pt. is able to donn stretchy shirts, donn slide on shoes, donning pants. Pt. has difficylty with managing a bra,shirt buttons, and pant zipper/button.Pt. requires minA from her husband following multiple recent surgeries. Pt. requires minA from her husband. 08/08/20: Pt continues to require MIN A for grooming and dressing tasks; 03/07/21: pt performs UB/LB bathing and dressing with set up (assist with clothing fasteners);  min A for grooming d/t unable to manage hair with 2 hands 40th visit: Pt. conitnues to require minA  for self-grooming, Pt. continues to perform UB/LB bathing/dressing, assist with fasteners    Time 12    Period Weeks    Status On-going    Target  Date 08/21/21      OT LONG TERM GOAL #2   Title Pt will demonstrate 60* RUE shoulder flexion in prep for functional reach with pain less than or equal to 3/10.    Baseline 12/13/20:  shoulder flexion right 64 degrees 10/31/20: Pt. continues to present with limited AROM for isolated  right shoulder flexion with pain. 08/08/20: R AROM Shoulder flexion - 45* c 3/10 pain.    Time 12    Period Weeks    Status On-going    Target Date 08/21/21      OT LONG TERM GOAL #3   Title Pt will use RUE as a stabilizer/ gross A at least 30 % of the time for ADLs/ IADLs.    Baseline Pt. is using her right hand as a gross stabilizer 15% of the time. 08/08/20: requires MAX cues; 03/07/21 Pt using R hand as a gross stabilizer at least 25% of the time. 40th visit: Pt. has difficulty using her right hand  as a gross stabilizer.    Time 12    Period Weeks    Status On-going    Target Date 08/21/21      OT LONG TERM GOAL #4   Title Pt will increase LUE grip strength to 25 lbs or greater for increased ease with ADLs.    Baseline 12/13/20: 24# left, 1# right 10/31/2020: Pt. continues to present with limited left grip. Pt. has improved with left grip strength. Pt.continues to work towards 25#. 08/08/20: L grip 19#; 03/07/21: L grip 21 lbs (R 1 lb) 05/18/2021: Left grip strength: 30#    Time 12    Period Weeks    Status On-going    Target Date 08/21/21      OT LONG TERM GOAL #5   Title 10/31/2020: Pt. is now perfroming laundry, loading the dishwasher, assists some with cooking, light meal preparation, setting the table    Baseline 12/13/2020:  continues to assist as needed but uses left hand .  10/13/20: Pt. assists with filling the dishwasher, and performing laundry. Pt. continues to  try to help when she can, however her husband mostly performs these tasks. 08/08/20: Husband performs; 03/07/21: Pt assists with laundry, loading dishwasher, light cooking/meal prep; mostly using LUE. 05/18/2021: pt. continues to assist with laundry skills, loading the dishwasher, light cooking using the left hand    Time 12    Period Weeks    Status On-going    Target Date 08/21/21      OT LONG TERM GOAL #6   Title Pt will demonstrate ability to grasp/ release a cup 2/3 trials with RUE.    Baseline 12/13/20:  Continues to work on consistency with grasp and release 10/31/2020: Pt. continues to initiate grasp, and active release of objects, however is unable to grasp/release a cup. 08/08/20: Able to grasp, unable to release x1 trial; 03/05/21: Able to grasp/release cup with min A to keep cup from tipping over.05/18/2021: Pt. is unable to grasp items secondary to changes in the right hand following Botox injections. Pt. has 2nd,a nd 3rd digit PIP extensor tightness.    Time 12    Period Weeks    Status On-going    Target Date 08/21/21      OT LONG TERM GOAL #7   Title Pt will demonstrate the ability to place clothing on hangers with use of bilateral UE and modified technique.    Baseline unable; 03/07/21: min-mod A    Time 12    Period  Weeks    Status On-going    Target Date 08/21/21      OT LONG TERM GOAL #8   Title I with updated HEP.    Baseline 12/13/2020: Continual changes to HEP 09/12/2020: Pt. continues to require assist for her HEP. Pt. requires assist, and cuing for HEP. 08/08/20: reports completing with husband assist for technique; 03/07/21: min vc. 05/18/2021: Pt. requires assist with HEPs, Husband assists with HEP.    Time 12    Period Weeks    Status Partially Met    Target Date 08/21/21      OT LONG TERM GOAL  #9   TITLE Pt will demonstrate the ability to don and doff robe after shower with modified independence    Baseline unable; 03/07/21: pt reports she avoids her robe (too  heavy); uses towel instead.    Time 12    Period Weeks    Status Deferred      OT LONG TERM GOAL  #10   TITLE Pt will demonstrate the ability to perform zippers with modified independence and use of adaptive equipment as needed.    Baseline difficulty; 03/07/21: cues to modify zippers with string loops (has not yet tried) 05/18/2021: TBD    Time 12    Period Weeks    Status On-going    Target Date 08/21/21                   Plan - 06/24/21 2048     Clinical Impression Statement Pt remains limited with ROM both active and passive in right hand especially with index and middle finger.  Discussed in detail the importance of daily ROM and pt admits she does not push motion at home as therapist does in the clinic.  Continue to work towards goals and reinforce HEP to make an impact on motion.    OT Occupational Profile and History Detailed Assessment- Review of Records and additional review of physical, cognitive, psychosocial history related to current functional performance    Occupational performance deficits (Please refer to evaluation for details): ADL's;IADL's;Rest and Sleep;Work;Play;Leisure    Body Structure / Function / Physical Skills ADL;Balance;Coordination;Decreased knowledge of precautions;Decreased knowledge of use of DME;Dexterity;Edema;Mobility;Tone;Strength;IADL;Sensation;GMC;Gait;ROM;FMC;Flexibility;Pain;Vision;UE functional use;Endurance    Cognitive Skills Attention;Perception;Problem Solve;Safety Awareness;Thought    Rehab Potential Good    Clinical Decision Making Several treatment options, min-mod task modification necessary    Comorbidities Affecting Occupational Performance: None    Modification or Assistance to Complete Evaluation  Min-Moderate modification of tasks or assist with assess necessary to complete eval    OT Frequency 2x / week    OT Duration 12 weeks    OT Treatment/Interventions Self-care/ADL training;Ultrasound;Energy  conservation;Visual/perceptual remediation/compensation;Patient/family education;DME and/or AE instruction;Aquatic Therapy;Paraffin;Gait Training;Passive range of motion;Balance training;Fluidtherapy;Electrical Stimulation;Functional Mobility Training;Splinting;Moist Heat;Therapeutic exercise;Manual Therapy;Cognitive remediation/compensation;Manual lymph drainage;Neuromuscular education;Coping strategies training    Plan Pt is transferring her care to [facility] as it is closer to home. She remains very fearful of moving RUE and responds well to heat, and estim.    Consulted and Agree with Plan of Care Patient             Patient will benefit from skilled therapeutic intervention in order to improve the following deficits and impairments:   Body Structure / Function / Physical Skills: ADL, Balance, Coordination, Decreased knowledge of precautions, Decreased knowledge of use of DME, Dexterity, Edema, Mobility, Tone, Strength, IADL, Sensation, GMC, Gait, ROM, FMC, Flexibility, Pain, Vision, UE functional use, Endurance Cognitive Skills: Attention, Perception, Problem  Solve, Safety Awareness, Thought     Visit Diagnosis: Muscle weakness (generalized)  Other lack of coordination  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

## 2021-06-24 NOTE — Therapy (Signed)
[place] MAIN [facility] SERVICES [address] [place], [place], [address] Phone: [phone]   Fax:  [phone]  Occupational Therapy Treatment  Patient Details  Name: Erika Stanton MRN: [id number] Date of Birth: [birth year] No data recorded  Encounter Date: 06/20/2021   OT End of Session - 06/24/21 2018     Visit Number 116    Number of Visits 148    Date for OT Re-Evaluation 08/21/21    OT Start Time 1300    OT Stop Time 1345    OT Time Calculation (min) 45 min    Activity Tolerance Patient tolerated treatment well    Behavior During Therapy [facility] for tasks assessed/performed             Past Medical History:  Diagnosis Date   Aneurysm (Opp)    Melena     Past Surgical History:  Procedure Laterality Date   CHOLECYSTECTOMY     COLONOSCOPY  11/2017   at [facility]. no recurrent polyps.  suggest repeat surveillance study 11/2022.     COLONOSCOPY W/ POLYPECTOMY  06/2014   Dr Roxy Manns at [facility].  3 adenomatoous polyps, anal fissure.     ESOPHAGOGASTRODUODENOSCOPY (EGD) WITH PROPOFOL N/A 01/27/2020   Procedure: ESOPHAGOGASTRODUODENOSCOPY (EGD) WITH PROPOFOL;  Surgeon: Mauri Pole, MD;  Location: [place] [facility];  Service: Endoscopy;  Laterality: N/A;   IR 3D INDEPENDENT WKST  01/06/2020   IR ANGIO INTRA EXTRACRAN SEL INTERNAL CAROTID BILAT MOD SED  01/06/2020   IR ANGIO VERTEBRAL SEL VERTEBRAL UNI L MOD SED  01/06/2020   IR ANGIOGRAM FOLLOW UP STUDY  01/06/2020   IR ANGIOGRAM FOLLOW UP STUDY  01/06/2020   IR ANGIOGRAM FOLLOW UP STUDY  01/06/2020   IR ANGIOGRAM FOLLOW UP STUDY  01/06/2020   IR ANGIOGRAM FOLLOW UP STUDY  01/06/2020   IR ANGIOGRAM FOLLOW UP STUDY  01/06/2020   IR ANGIOGRAM FOLLOW UP STUDY  01/06/2020   IR ANGIOGRAM FOLLOW UP STUDY  01/06/2020   IR ANGIOGRAM FOLLOW UP STUDY  01/06/2020   IR ANGIOGRAM FOLLOW UP STUDY  01/06/2020   IR NEURO EACH ADD'L AFTER BASIC UNI RIGHT (MS)  01/06/2020   IR TRANSCATH/EMBOLIZ  01/06/2020   RADIOLOGY  WITH ANESTHESIA N/A 01/06/2020   Procedure: IR WITH ANESTHESIA FOR ANEURYSM;  Surgeon: Consuella Lose, MD;  Location: [place];  Service: Radiology;  Laterality: N/A;   TUBAL LIGATION      There were no vitals filed for this visit.   Subjective Assessment - 06/24/21 2014     Subjective  Pt reports she is doing well, still wants to consider moving for her retirement years but is concerned about leaving this area due to her access to medical care.  Her husband wants to move back to his home state of [place].  If they stay in this area, they may need to move to another house.  Her sister is coming to help with cleaning out some of her stored things since they may be getting a new floor soon.    Pertinent History ICH 01/06/20, Aneurysm. PMH: OA bilateral knees and hands    Patient Stated Goals Pt would like to be as independent as possible.    Currently in Pain? Yes    Pain Score 3     Pain Location Hand    Pain Orientation Right    Pain Descriptors / Indicators Aching;Sharp    Pain Type Chronic pain    Pain Onset More than a month  ago    Pain Frequency Intermittent            Pt seen for paraffin bath to right hand and digits to increase tissue mobility, decrease pain and increase range of motion.   Pt dipping hand 5-6 times, use of cling wrap over hand and towel to keep heat in. Performed prior to therapeutic exercises.    Therapeutic Exercises:   Pt seen for PROM of right hand, wrist, forearm and shoulder with prolonged stretching to right index and middle fingers for flexion and ring and small finger for extension.  Pt continues to be limited by pain in right hand especially with attempts for flexion of digits.  Prolonged stretching to right index and middle finger for flexion, ring and small finger with extension.  Pt still not able to demonstrate active flexion of index and middle to hold objects with any precision.  Shoulder ROM in sitting for shoulder flexion, ABD, ER, elbow  flexion/extension, supination/pronation, wrist flexion/extension with therapist assist.    Response to tx: Pt responds well to deep heat, therapist decreased heat to sensitive setting prior to session and paraffin was around 120 degrees, pt reports it still felt fairly hot to her but she was able to tolerate it well, skin inspected prior to and after tx with no issues.  Continues to be limited by pain in right hand and difficulty with tolerating ROM to index and middle fingers.  Continue to work towards goals to improve RUE ROM, decrease pain and improve use of hand towards functional use/gross assist.                       OT Education - 06/24/21 2017     Education Details Positioning to encourage ROM, prolonged stretch    Person(s) Educated Patient    Methods Explanation;Verbal cues;Demonstration    Comprehension Verbalized understanding;Returned demonstration;Tactile cues required;Verbal cues required;Need further instruction              OT Short Term Goals - 05/18/21 1418       OT SHORT TERM GOAL #4   Baseline Pt. presents with limited isolated  right shoulder flexion with pain continues to be 2. 08/08/20: 40* AROM with 3/10 pain; 03/07/21: Pt demos 75* active shoulder flexion with 2/10 pain or less. 40th visit: Pt. continues to present with limited shoulder ROM, and pain has decreased.    Time 4    Period Weeks    Status On-going    Target Date 08/21/21      OT SHORT TERM GOAL #5   Title Pt will demonstrate improved LUE fine motor coordination for ADLs as evidenced by decreasing 9 hole peg test score to 25 secs or less.    Baseline 10/31/2020: Left FMC: 32 sec. Left FMC 41 sec visa the 9 hole peg test. 08/08/20: 32 seconds via 9 hole peg 40th visit: Left hand: 35 sec.    Time 12    Period Weeks    Status On-going    Target Date 08/21/21               OT Long Term Goals - 05/18/21 1422       OT LONG TERM GOAL #1   Title Pt will perform all basic ADLs  with supervision    Baseline 01/24/21 assist with bra 12/13/2020 unchanged 6/202/2022: Pt. is able to donn stretchy shirts, donn slide on shoes, donning pants. Pt. has difficylty with managing a bra,shirt buttons, and  pant zipper/button.Pt. requires minA from her husband following multiple recent surgeries. Pt. requires minA from her husband. 08/08/20: Pt continues to require MIN A for grooming and dressing tasks; 03/07/21: pt performs UB/LB bathing and dressing with set up (assist with clothing fasteners); min A for grooming d/t unable to manage hair with 2 hands 40th visit: Pt. conitnues to require minA  for self-grooming, Pt. continues to perform UB/LB bathing/dressing, assist with fasteners    Time 12    Period Weeks    Status On-going    Target Date 08/21/21      OT LONG TERM GOAL #2   Title Pt will demonstrate 60* RUE shoulder flexion in prep for functional reach with pain less than or equal to 3/10.    Baseline 12/13/20:  shoulder flexion right 64 degrees 10/31/20: Pt. continues to present with limited AROM for isolated  right shoulder flexion with pain. 08/08/20: R AROM Shoulder flexion - 45* c 3/10 pain.    Time 12    Period Weeks    Status On-going    Target Date 08/21/21      OT LONG TERM GOAL #3   Title Pt will use RUE as a stabilizer/ gross A at least 30 % of the time for ADLs/ IADLs.    Baseline Pt. is using her right hand as a gross stabilizer 15% of the time. 08/08/20: requires MAX cues; 03/07/21 Pt using R hand as a gross stabilizer at least 25% of the time. 40th visit: Pt. has difficulty using her right hand  as a gross stabilizer.    Time 12    Period Weeks    Status On-going    Target Date 08/21/21      OT LONG TERM GOAL #4   Title Pt will increase LUE grip strength to 25 lbs or greater for increased ease with ADLs.    Baseline 12/13/20: 24# left, 1# right 10/31/2020: Pt. continues to present with limited left grip. Pt. has improved with left grip strength. Pt.continues to work  towards 25#. 08/08/20: L grip 19#; 03/07/21: L grip 21 lbs (R 1 lb) 05/18/2021: Left grip strength: 30#    Time 12    Period Weeks    Status On-going    Target Date 08/21/21      OT LONG TERM GOAL #5   Title 10/31/2020: Pt. is now perfroming laundry, loading the dishwasher, assists some with cooking, light meal preparation, setting the table    Baseline 12/13/2020:  continues to assist as needed but uses left hand .  10/13/20: Pt. assists with filling the dishwasher, and performing laundry. Pt. continues to try to help when she can, however her husband mostly performs these tasks. 08/08/20: Husband performs; 03/07/21: Pt assists with laundry, loading dishwasher, light cooking/meal prep; mostly using LUE. 05/18/2021: pt. continues to assist with laundry skills, loading the dishwasher, light cooking using the left hand    Time 12    Period Weeks    Status On-going    Target Date 08/21/21      OT LONG TERM GOAL #6   Title Pt will demonstrate ability to grasp/ release a cup 2/3 trials with RUE.    Baseline 12/13/20:  Continues to work on consistency with grasp and release 10/31/2020: Pt. continues to initiate grasp, and active release of objects, however is unable to grasp/release a cup. 08/08/20: Able to grasp, unable to release x1 trial; 03/05/21: Able to grasp/release cup with min A to keep cup from tipping over.05/18/2021: Pt.  is unable to grasp items secondary to changes in the right hand following Botox injections. Pt. has 2nd,a nd 3rd digit PIP extensor tightness.    Time 12    Period Weeks    Status On-going    Target Date 08/21/21      OT LONG TERM GOAL #7   Title Pt will demonstrate the ability to place clothing on hangers with use of bilateral UE and modified technique.    Baseline unable; 03/07/21: min-mod A    Time 12    Period Weeks    Status On-going    Target Date 08/21/21      OT LONG TERM GOAL #8   Title I with updated HEP.    Baseline 12/13/2020: Continual changes to HEP 09/12/2020: Pt.  continues to require assist for her HEP. Pt. requires assist, and cuing for HEP. 08/08/20: reports completing with husband assist for technique; 03/07/21: min vc. 05/18/2021: Pt. requires assist with HEPs, Husband assists with HEP.    Time 12    Period Weeks    Status Partially Met    Target Date 08/21/21      OT LONG TERM GOAL  #9   TITLE Pt will demonstrate the ability to don and doff robe after shower with modified independence    Baseline unable; 03/07/21: pt reports she avoids her robe (too heavy); uses towel instead.    Time 12    Period Weeks    Status Deferred      OT LONG TERM GOAL  #10   TITLE Pt will demonstrate the ability to perform zippers with modified independence and use of adaptive equipment as needed.    Baseline difficulty; 03/07/21: cues to modify zippers with string loops (has not yet tried) 05/18/2021: TBD    Time 12    Period Weeks    Status On-going    Target Date 08/21/21                   Plan - 06/24/21 2018     Clinical Impression Statement Pt responds well to deep heat, therapist decreased heat to sensitive setting prior to session and paraffin was around 120 degrees, pt reports it still felt fairly hot to her but she was able to tolerate it well, skin inspected prior to and after tx with no issues.  Continues to be limited by pain in right hand and difficulty with tolerating ROM to index and middle fingers.  Continue to work towards goals to improve RUE ROM, decrease pain and improve use of hand towards functional use/gross assist.    OT Occupational Profile and History Detailed Assessment- Review of Records and additional review of physical, cognitive, psychosocial history related to current functional performance    Occupational performance deficits (Please refer to evaluation for details): ADL's;IADL's;Rest and Sleep;Work;Play;Leisure    Body Structure / Function / Physical Skills ADL;Balance;Coordination;Decreased knowledge of precautions;Decreased  knowledge of use of DME;Dexterity;Edema;Mobility;Tone;Strength;IADL;Sensation;GMC;Gait;ROM;FMC;Flexibility;Pain;Vision;UE functional use;Endurance    Cognitive Skills Attention;Perception;Problem Solve;Safety Awareness;Thought    Rehab Potential Good    Clinical Decision Making Several treatment options, min-mod task modification necessary    Comorbidities Affecting Occupational Performance: None    Modification or Assistance to Complete Evaluation  Min-Moderate modification of tasks or assist with assess necessary to complete eval    OT Frequency 2x / week    OT Duration 12 weeks    OT Treatment/Interventions Self-care/ADL training;Ultrasound;Energy conservation;Visual/perceptual remediation/compensation;Patient/family education;DME and/or AE instruction;Aquatic Therapy;Paraffin;Gait Training;Passive range of motion;Balance training;Fluidtherapy;Electrical Stimulation;Functional Mobility Training;Splinting;Moist Heat;Therapeutic  exercise;Manual Therapy;Cognitive remediation/compensation;Manual lymph drainage;Neuromuscular education;Coping strategies training    Plan Pt is transferring her care to [facility] as it is closer to home. She remains very fearful of moving RUE and responds well to heat, and estim.    Consulted and Agree with Plan of Care Patient             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,  unspecified laterality (HCC)  Acute pain of right shoulder  Pain in right hand    Problem List Patient Active Problem List   Diagnosis Date Noted   Dyspareunia in female 04/05/2020   History of abnormal cervical Pap smear 03/10/2020   GIB (gastrointestinal bleeding) 02/28/2020   Elevated BUN    Prediabetes    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[place]) 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   Cerebral vasospasm    Sinus tachycardia    Dysphagia, post-stroke    Thrombocytopenia (HCC)    Acute blood loss anemia    Brain aneurysm    ICH (intracerebral hemorrhage) ([place]) 01/05/2020   History of adenomatous polyp of colon 02/07/2016   Anatomical narrow angle, 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 [place], [place], [address] Phone: [phone]   Fax:  [phone]  Name: Erika Stanton MRN: [id number] Date of Birth: [birth year]

## 2021-06-27 ENCOUNTER — Ambulatory Visit: Payer: Medicare PPO | Admitting: Occupational Therapy

## 2021-06-27 ENCOUNTER — Encounter: Payer: Self-pay | Admitting: Occupational Therapy

## 2021-06-27 ENCOUNTER — Other Ambulatory Visit: Payer: Self-pay

## 2021-06-27 DIAGNOSIS — I69159 Hemiplegia and hemiparesis following nontraumatic intracerebral hemorrhage affecting unspecified side: Secondary | ICD-10-CM

## 2021-06-27 DIAGNOSIS — R278 Other lack of coordination: Secondary | ICD-10-CM

## 2021-06-27 DIAGNOSIS — M25511 Pain in right shoulder: Secondary | ICD-10-CM

## 2021-06-27 DIAGNOSIS — M6281 Muscle weakness (generalized): Secondary | ICD-10-CM | POA: Diagnosis not present

## 2021-06-27 DIAGNOSIS — M79641 Pain in right hand: Secondary | ICD-10-CM

## 2021-06-29 ENCOUNTER — Ambulatory Visit: Payer: Medicare PPO | Admitting: Occupational Therapy

## 2021-06-29 ENCOUNTER — Other Ambulatory Visit: Payer: Self-pay

## 2021-06-29 DIAGNOSIS — R278 Other lack of coordination: Secondary | ICD-10-CM

## 2021-06-29 DIAGNOSIS — M25511 Pain in right shoulder: Secondary | ICD-10-CM

## 2021-06-29 DIAGNOSIS — M6281 Muscle weakness (generalized): Secondary | ICD-10-CM

## 2021-06-29 DIAGNOSIS — M79641 Pain in right hand: Secondary | ICD-10-CM

## 2021-06-29 DIAGNOSIS — I69159 Hemiplegia and hemiparesis following nontraumatic intracerebral hemorrhage affecting unspecified side: Secondary | ICD-10-CM

## 2021-06-30 NOTE — Therapy (Signed)
[place] MAIN [facility] SERVICES [address] [place], [place], [address] Phone: [phone]   Fax:  [phone]  Occupational Therapy Treatment  Patient Details  Name: Erika Stanton MRN: [id number] Date of Birth: [birth year] No data recorded  Encounter Date: 06/27/2021   OT End of Session - 06/30/21 1113     Visit Number 118    Number of Visits 148    Date for OT Re-Evaluation 08/21/21    OT Start Time 1300    OT Stop Time 1346    OT Time Calculation (min) 46 min    Activity Tolerance Patient tolerated treatment well    Behavior During Therapy [facility] for tasks assessed/performed             Past Medical History:  Diagnosis Date   Aneurysm ([place])    Melena     Past Surgical History:  Procedure Laterality Date   CHOLECYSTECTOMY     COLONOSCOPY  11/2017   at [facility]. no recurrent polyps.  suggest repeat surveillance study 11/2022.     COLONOSCOPY W/ POLYPECTOMY  06/2014   Dr Roxy Manns at [facility]  3 adenomatoous polyps, anal fissure.     ESOPHAGOGASTRODUODENOSCOPY (EGD) WITH PROPOFOL N/A 01/27/2020   Procedure: ESOPHAGOGASTRODUODENOSCOPY (EGD) WITH PROPOFOL;  Surgeon: Mauri Pole, MD;  Location: [place] [facility];  Service: Endoscopy;  Laterality: N/A;   IR 3D INDEPENDENT WKST  01/06/2020   IR ANGIO INTRA EXTRACRAN SEL INTERNAL CAROTID BILAT MOD SED  01/06/2020   IR ANGIO VERTEBRAL SEL VERTEBRAL UNI L MOD SED  01/06/2020   IR ANGIOGRAM FOLLOW UP STUDY  01/06/2020   IR ANGIOGRAM FOLLOW UP STUDY  01/06/2020   IR ANGIOGRAM FOLLOW UP STUDY  01/06/2020   IR ANGIOGRAM FOLLOW UP STUDY  01/06/2020   IR ANGIOGRAM FOLLOW UP STUDY  01/06/2020   IR ANGIOGRAM FOLLOW UP STUDY  01/06/2020   IR ANGIOGRAM FOLLOW UP STUDY  01/06/2020   IR ANGIOGRAM FOLLOW UP STUDY  01/06/2020   IR ANGIOGRAM FOLLOW UP STUDY  01/06/2020   IR ANGIOGRAM FOLLOW UP STUDY  01/06/2020   IR NEURO EACH ADD'L AFTER BASIC UNI RIGHT (MS)  01/06/2020   IR TRANSCATH/EMBOLIZ  01/06/2020   RADIOLOGY  WITH ANESTHESIA N/A 01/06/2020   Procedure: IR WITH ANESTHESIA FOR ANEURYSM;  Surgeon: Consuella Lose, MD;  Location: [place];  Service: Radiology;  Laterality: N/A;   TUBAL LIGATION      There were no vitals filed for this visit.   Moist heat to right shoulder and hand prior to treatment to decrease pain, increase tissue mobility and increase ROM.    Therapeutic Exercises:  PROM stretches by therapist for shoulder flexion, ABD, ER, elbow flex/ext, supination/pronation, wrist flex/ext, digit flex/extension.  All motions followed by A/AAROM for same motions listed previously.  Prolonged stretch to right index and middle finger for flexion especially at PIP, working towards composite flexion of all digits.    Attempts at gross grasp and release of objects, pt able to pick up mid sized objects with use of lateral pinch and/or pattern with fingers extended and thumb under object.    Response to tx:   Pt limited with thumb active movement at [facility] and IP joints which appears to be related to arthritis but pt has less pain and sensitivity at the joint this date compared to the past.  Continues to lack active flexion in the index and middle finger to be able to wrap fingers around objects. Pt is unsure  if she would like to get Botox again, she felt it did not help the last time and feels her index and middle fingers are more difficult to bend, recommend she discuss with physician regarding the Botox injections, which could be related to dosage or where it was placed last.  Continue to work towards goals in plan of care to improve RUE motion and function for daily tasks.                     OT Education - 06/29/21 1106     Education Details Positioning to encourage ROM, prolonged stretch for index and middle finger for flexion    Person(s) Educated Patient    Methods Explanation;Verbal cues;Demonstration    Comprehension Verbalized understanding;Returned demonstration;Tactile cues  required;Verbal cues required;Need further instruction              OT Short Term Goals - 05/18/21 1418       OT SHORT TERM GOAL #4   Baseline Pt. presents with limited isolated  right shoulder flexion with pain continues to be 2. 08/08/20: 40* AROM with 3/10 pain; 03/07/21: Pt demos 75* active shoulder flexion with 2/10 pain or less. 40th visit: Pt. continues to present with limited shoulder ROM, and pain has decreased.    Time 4    Period Weeks    Status On-going    Target Date 08/21/21      OT SHORT TERM GOAL #5   Title Pt will demonstrate improved LUE fine motor coordination for ADLs as evidenced by decreasing 9 hole peg test score to 25 secs or less.    Baseline 10/31/2020: Left FMC: 32 sec. Left FMC 41 sec visa the 9 hole peg test. 08/08/20: 32 seconds via 9 hole peg 40th visit: Left hand: 35 sec.    Time 12    Period Weeks    Status On-going    Target Date 08/21/21               OT Long Term Goals - 05/18/21 1422       OT LONG TERM GOAL #1   Title Pt will perform all basic ADLs with supervision    Baseline 01/24/21 assist with bra 12/13/2020 unchanged 6/202/2022: Pt. is able to donn stretchy shirts, donn slide on shoes, donning pants. Pt. has difficylty with managing a bra,shirt buttons, and pant zipper/button.Pt. requires minA from her husband following multiple recent surgeries. Pt. requires minA from her husband. 08/08/20: Pt continues to require MIN A for grooming and dressing tasks; 03/07/21: pt performs UB/LB bathing and dressing with set up (assist with clothing fasteners); min A for grooming d/t unable to manage hair with 2 hands 40th visit: Pt. conitnues to require minA  for self-grooming, Pt. continues to perform UB/LB bathing/dressing, assist with fasteners    Time 12    Period Weeks    Status On-going    Target Date 08/21/21      OT LONG TERM GOAL #2   Title Pt will demonstrate 60* RUE shoulder flexion in prep for functional reach with pain less than or equal  to 3/10.    Baseline 12/13/20:  shoulder flexion right 64 degrees 10/31/20: Pt. continues to present with limited AROM for isolated  right shoulder flexion with pain. 08/08/20: R AROM Shoulder flexion - 45* c 3/10 pain.    Time 12    Period Weeks    Status On-going    Target Date 08/21/21      OT  LONG TERM GOAL #3   Title Pt will use RUE as a stabilizer/ gross A at least 30 % of the time for ADLs/ IADLs.    Baseline Pt. is using her right hand as a gross stabilizer 15% of the time. 08/08/20: requires MAX cues; 03/07/21 Pt using R hand as a gross stabilizer at least 25% of the time. 40th visit: Pt. has difficulty using her right hand  as a gross stabilizer.    Time 12    Period Weeks    Status On-going    Target Date 08/21/21      OT LONG TERM GOAL #4   Title Pt will increase LUE grip strength to 25 lbs or greater for increased ease with ADLs.    Baseline 12/13/20: 24# left, 1# right 10/31/2020: Pt. continues to present with limited left grip. Pt. has improved with left grip strength. Pt.continues to work towards 25#. 08/08/20: L grip 19#; 03/07/21: L grip 21 lbs (R 1 lb) 05/18/2021: Left grip strength: 30#    Time 12    Period Weeks    Status On-going    Target Date 08/21/21      OT LONG TERM GOAL #5   Title 10/31/2020: Pt. is now perfroming laundry, loading the dishwasher, assists some with cooking, light meal preparation, setting the table    Baseline 12/13/2020:  continues to assist as needed but uses left hand .  10/13/20: Pt. assists with filling the dishwasher, and performing laundry. Pt. continues to try to help when she can, however her husband mostly performs these tasks. 08/08/20: Husband performs; 03/07/21: Pt assists with laundry, loading dishwasher, light cooking/meal prep; mostly using LUE. 05/18/2021: pt. continues to assist with laundry skills, loading the dishwasher, light cooking using the left hand    Time 12    Period Weeks    Status On-going    Target Date 08/21/21      OT LONG  TERM GOAL #6   Title Pt will demonstrate ability to grasp/ release a cup 2/3 trials with RUE.    Baseline 12/13/20:  Continues to work on consistency with grasp and release 10/31/2020: Pt. continues to initiate grasp, and active release of objects, however is unable to grasp/release a cup. 08/08/20: Able to grasp, unable to release x1 trial; 03/05/21: Able to grasp/release cup with min A to keep cup from tipping over.05/18/2021: Pt. is unable to grasp items secondary to changes in the right hand following Botox injections. Pt. has 2nd,a nd 3rd digit PIP extensor tightness.    Time 12    Period Weeks    Status On-going    Target Date 08/21/21      OT LONG TERM GOAL #7   Title Pt will demonstrate the ability to place clothing on hangers with use of bilateral UE and modified technique.    Baseline unable; 03/07/21: min-mod A    Time 12    Period Weeks    Status On-going    Target Date 08/21/21      OT LONG TERM GOAL #8   Title I with updated HEP.    Baseline 12/13/2020: Continual changes to HEP 09/12/2020: Pt. continues to require assist for her HEP. Pt. requires assist, and cuing for HEP. 08/08/20: reports completing with husband assist for technique; 03/07/21: min vc. 05/18/2021: Pt. requires assist with HEPs, Husband assists with HEP.    Time 12    Period Weeks    Status Partially Met    Target Date 08/21/21  OT LONG TERM GOAL  #9   TITLE Pt will demonstrate the ability to don and doff robe after shower with modified independence    Baseline unable; 03/07/21: pt reports she avoids her robe (too heavy); uses towel instead.    Time 12    Period Weeks    Status Deferred      OT LONG TERM GOAL  #10   TITLE Pt will demonstrate the ability to perform zippers with modified independence and use of adaptive equipment as needed.    Baseline difficulty; 03/07/21: cues to modify zippers with string loops (has not yet tried) 05/18/2021: TBD    Time 12    Period Weeks    Status On-going    Target  Date 08/21/21                   Plan - 06/30/21 1114     Clinical Impression Statement Pt limited with thumb active movement at [facility] and IP joints which appears to be related to arthritis but pt has less pain and sensitivity at the joint this date compared to the past.  Continues to lack active flexion in the index and middle finger to be able to wrap fingers around objects. Pt is unsure if she would like to get Botox again, she felt it did not help the last time and feels her index and middle fingers are more difficult to bend, recommend she discuss with physician regarding the Botox injections, which could be related to dosage or where it was placed last.  Continue to work towards goals in plan of care to improve RUE motion and function for daily tasks.    OT Occupational Profile and History Detailed Assessment- Review of Records and additional review of physical, cognitive, psychosocial history related to current functional performance    Occupational performance deficits (Please refer to evaluation for details): ADL's;IADL's;Rest and Sleep;Work;Play;Leisure    Body Structure / Function / Physical Skills ADL;Balance;Coordination;Decreased knowledge of precautions;Decreased knowledge of use of DME;Dexterity;Edema;Mobility;Tone;Strength;IADL;Sensation;GMC;Gait;ROM;FMC;Flexibility;Pain;Vision;UE functional use;Endurance    Cognitive Skills Attention;Perception;Problem Solve;Safety Awareness;Thought    Rehab Potential Good    Clinical Decision Making Several treatment options, min-mod task modification necessary    Comorbidities Affecting Occupational Performance: None    Modification or Assistance to Complete Evaluation  Min-Moderate modification of tasks or assist with assess necessary to complete eval    OT Frequency 2x / week    OT Duration 12 weeks    OT Treatment/Interventions Self-care/ADL training;Ultrasound;Energy conservation;Visual/perceptual remediation/compensation;Patient/family  education;DME and/or AE instruction;Aquatic Therapy;Paraffin;Gait Training;Passive range of motion;Balance training;Fluidtherapy;Electrical Stimulation;Functional Mobility Training;Splinting;Moist Heat;Therapeutic exercise;Manual Therapy;Cognitive remediation/compensation;Manual lymph drainage;Neuromuscular education;Coping strategies training    Plan Pt is transferring her care to [facility] as it is closer to home. She remains very fearful of moving RUE and responds well to heat, and estim.    Consulted and Agree with Plan of Care Patient             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, unspecified laterality (HCC)  Acute pain of  right shoulder  Pain in right hand    Problem List Patient Active Problem List   Diagnosis Date Noted   Dyspareunia in female 04/05/2020   History of abnormal cervical Pap smear 03/10/2020   GIB (gastrointestinal bleeding) 02/28/2020   Elevated BUN    Prediabetes    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   Cerebral vasospasm    Sinus tachycardia    Dysphagia, post-stroke    Thrombocytopenia (HCC)    Acute blood loss anemia    Brain aneurysm    ICH (intracerebral hemorrhage) ([place]) 01/05/2020   History of adenomatous polyp of colon 02/07/2016   Anatomical narrow angle, 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

## 2021-07-04 ENCOUNTER — Encounter: Payer: Self-pay | Admitting: Occupational Therapy

## 2021-07-04 ENCOUNTER — Other Ambulatory Visit: Payer: Self-pay

## 2021-07-04 ENCOUNTER — Ambulatory Visit: Payer: Medicare PPO

## 2021-07-04 DIAGNOSIS — R482 Apraxia: Secondary | ICD-10-CM

## 2021-07-04 DIAGNOSIS — M6281 Muscle weakness (generalized): Secondary | ICD-10-CM

## 2021-07-04 DIAGNOSIS — R278 Other lack of coordination: Secondary | ICD-10-CM

## 2021-07-04 NOTE — Therapy (Signed)
[place] MAIN [facility] SERVICES [address] [place], [place], [address] Phone: [phone]   Fax:  [phone]  Occupational Therapy Treatment  Patient Details  Name: Erika Stanton MRN: [id number] Date of Birth: 1955-06-25 No data recorded  Encounter Date: 06/29/2021   OT End of Session - 07/04/21 1204     Visit Number 119    Number of Visits 148    Date for OT Re-Evaluation 08/21/21    OT Start Time 1300    OT Stop Time 1350    OT Time Calculation (min) 50 min    Activity Tolerance Patient tolerated treatment well    Behavior During Therapy [facility] for tasks assessed/performed             Past Medical History:  Diagnosis Date   Aneurysm ([place])    Melena     Past Surgical History:  Procedure Laterality Date   CHOLECYSTECTOMY     COLONOSCOPY  11/2017   at [facility]. no recurrent polyps.  suggest repeat surveillance study 11/2022.     COLONOSCOPY W/ POLYPECTOMY  06/2014   Dr Roxy Manns at [facility].  3 adenomatoous polyps, anal fissure.     ESOPHAGOGASTRODUODENOSCOPY (EGD) WITH PROPOFOL N/A 01/27/2020   Procedure: ESOPHAGOGASTRODUODENOSCOPY (EGD) WITH PROPOFOL;  Surgeon: Mauri Pole, MD;  Location: [place] [facility];  Service: Endoscopy;  Laterality: N/A;   IR 3D INDEPENDENT WKST  01/06/2020   IR ANGIO INTRA EXTRACRAN SEL INTERNAL CAROTID BILAT MOD SED  01/06/2020   IR ANGIO VERTEBRAL SEL VERTEBRAL UNI L MOD SED  01/06/2020   IR ANGIOGRAM FOLLOW UP STUDY  01/06/2020   IR ANGIOGRAM FOLLOW UP STUDY  01/06/2020   IR ANGIOGRAM FOLLOW UP STUDY  01/06/2020   IR ANGIOGRAM FOLLOW UP STUDY  01/06/2020   IR ANGIOGRAM FOLLOW UP STUDY  01/06/2020   IR ANGIOGRAM FOLLOW UP STUDY  01/06/2020   IR ANGIOGRAM FOLLOW UP STUDY  01/06/2020   IR ANGIOGRAM FOLLOW UP STUDY  01/06/2020   IR ANGIOGRAM FOLLOW UP STUDY  01/06/2020   IR ANGIOGRAM FOLLOW UP STUDY  01/06/2020   IR NEURO EACH ADD'L AFTER BASIC UNI RIGHT (MS)  01/06/2020   IR TRANSCATH/EMBOLIZ  01/06/2020   RADIOLOGY  WITH ANESTHESIA N/A 01/06/2020   Procedure: IR WITH ANESTHESIA FOR ANEURYSM;  Surgeon: Consuella Lose, MD;  Location: [place];  Service: Radiology;  Laterality: N/A;   TUBAL LIGATION      There were no vitals filed for this visit.   Subjective Assessment - 07/04/21 1202     Subjective  Pt reports she is doing well, still not sure she wants to do Botox again since she feels it did not help last time.    Pertinent History ICH 01/06/20, Aneurysm. PMH: OA bilateral knees and hands    Patient Stated Goals Pt would like to be as independent as possible.    Currently in Pain? Yes    Pain Score 3     Pain Location Hand    Pain Orientation Right    Pain Descriptors / Indicators Aching;Sharp    Pain Type Chronic pain    Pain Onset More than a month ago    Pain Frequency Intermittent             Pt seen for paraffin bath to right hand and wrist, paraffin placed on sensitive setting prior to patient's arrival, temp at 120 degrees.  Pt dipping 3-4 times into wax, saran wrap applied and towel to keep in  heat for 8  mins.  Moist heat to right shoulder applied during the same time as paraffin.  Both heat modalities utilized to decrease pain, increase motion and increase tissue mobility.  Performed prior to therapeutic exercises. ° °Therapeutic Exercise: °Pt seen for PROM of right hand and wrist with prolonged stretching to index and middle fingers at PIP joint.  Pt remains tender and sensitive to ROM and therapist has to move slow and incremental for patient to tolerate.  Continued focus on attempts to move index and middle fingers actively, pt still unable to produce active movement to flex joints at this time.  She engaged in attempts to pick up items but is only able to do so currently with fingers extended and thumb under object with items greater than one inch in size.  Ring and small finger tend to stay in flexion but has shown more progress with extending this digits on commands after ROM.  AAROM  of shoulder for flexion, ABD, elbow flexion/extension, forearm supination/pronation.  Pt encouraged to move shoulder daily.  Since she cannot move right hand as much for functional grasp, she is not engaging her shoulder as much into activities.  ° °Response to tx: °Since last Botox injection, pt has demonstrated and increased amount of finger extension in right index and middle fingers.  She is unable to demonstrate active flexion in these digits and also has difficulty with tolerating passive ROM by therapist.  Paraffin appears to help with ROM however, pt requires sensitive setting with temp decreased to around 118-120 range.  She performs better with distraction from activity while therapist flexes these digits.  Will continue to problem solve ways to move digits into flexion to avoid contractures of fingers.  Continue to discuss with her the recommendation she talk with her MD about her last results with Botox  and to see what his future recommendations would be regarding any additional injections as well as placement and dosage of injections.  Will need progress update next session.   ° ° ° ° ° ° ° ° ° ° ° ° ° ° ° ° ° ° ° ° OT Education - 07/04/21 1204   ° ° Education Details Positioning to encourage ROM, prolonged stretch for index and middle finger for flexion   ° Person(s) Educated Patient   ° Methods Explanation;Verbal cues;Demonstration   ° Comprehension Verbalized understanding;Returned demonstration;Tactile cues required;Verbal cues required;Need further instruction   ° °  °  ° °  ° ° ° OT Short Term Goals - 05/18/21 1418   ° °  ° OT SHORT TERM GOAL #4  ° Baseline Pt. presents with limited isolated  right shoulder flexion with pain continues to be 2. 08/08/20: 40* AROM with 3/10 pain; 03/07/21: Pt demos 75* active shoulder flexion with 2/10 pain or less. 40th visit: Pt. continues to present with limited shoulder ROM, and pain has decreased.   ° Time 4   ° Period Weeks   ° Status On-going   ° Target Date  08/21/21   °  ° OT SHORT TERM GOAL #5  ° Title Pt will demonstrate improved LUE fine motor coordination for ADLs as evidenced by decreasing 9 hole peg test score to 25 secs or less.   ° Baseline 10/31/2020: Left FMC: 32 sec. Left FMC 41 sec visa the 9 hole peg test. 08/08/20: 32 seconds via 9 hole peg 40th visit: Left hand: 35 sec.   ° Time 12   ° Period Weeks   °   Status On-going   ° Target Date 08/21/21   ° °  °  ° °  ° ° ° ° OT Long Term Goals - 05/18/21 1422   ° °  ° OT LONG TERM GOAL #1  ° Title Pt will perform all basic ADLs with supervision   ° Baseline 01/24/21 assist with bra 12/13/2020 unchanged 6/202/2022: Pt. is able to donn stretchy shirts, donn slide on shoes, donning pants. Pt. has difficylty with managing a bra,shirt buttons, and pant zipper/button.Pt. requires minA from her husband following multiple recent surgeries. Pt. requires minA from her husband. 08/08/20: Pt continues to require MIN A for grooming and dressing tasks; 03/07/21: pt performs UB/LB bathing and dressing with set up (assist with clothing fasteners); min A for grooming d/t unable to manage hair with 2 hands 40th visit: Pt. conitnues to require minA  for self-grooming, Pt. continues to perform UB/LB bathing/dressing, assist with fasteners   ° Time 12   ° Period Weeks   ° Status On-going   ° Target Date 08/21/21   °  ° OT LONG TERM GOAL #2  ° Title Pt will demonstrate 60* RUE shoulder flexion in prep for functional reach with pain less than or equal to 3/10.   ° Baseline 12/13/20:  shoulder flexion right 64 degrees 10/31/20: Pt. continues to present with limited AROM for isolated  right shoulder flexion with pain. 08/08/20: R AROM Shoulder flexion - 45* c 3/10 pain.   ° Time 12   ° Period Weeks   ° Status On-going   ° Target Date 08/21/21   °  ° OT LONG TERM GOAL #3  ° Title Pt will use RUE as a stabilizer/ gross A at least 30 % of the time for ADLs/ IADLs.   ° Baseline Pt. is using her right hand as a gross stabilizer 15% of the time. 08/08/20:  requires MAX cues; 03/07/21 Pt using R hand as a gross stabilizer at least 25% of the time. 40th visit: Pt. has difficulty using her right hand  as a gross stabilizer.   ° Time 12   ° Period Weeks   ° Status On-going   ° Target Date 08/21/21   °  ° OT LONG TERM GOAL #4  ° Title Pt will increase LUE grip strength to 25 lbs or greater for increased ease with ADLs.   ° Baseline 12/13/20: 24# left, 1# right 10/31/2020: Pt. continues to present with limited left grip. Pt. has improved with left grip strength. Pt.continues to work towards 25#. 08/08/20: L grip 19#; 03/07/21: L grip 21 lbs (R 1 lb) 05/18/2021: Left grip strength: 30#   ° Time 12   ° Period Weeks   ° Status On-going   ° Target Date 08/21/21   °  ° OT LONG TERM GOAL #5  ° Title 10/31/2020: Pt. is now perfroming laundry, loading the dishwasher, assists some with cooking, light meal preparation, setting the table   ° Baseline 12/13/2020:  continues to assist as needed but uses left hand .  10/13/20: Pt. assists with filling the dishwasher, and performing laundry. Pt. continues to try to help when she can, however her husband mostly performs these tasks. 08/08/20: Husband performs; 03/07/21: Pt assists with laundry, loading dishwasher, light cooking/meal prep; mostly using LUE. 05/18/2021: pt. continues to assist with laundry skills, loading the dishwasher, light cooking using the left hand   ° Time 12   ° Period Weeks   ° Status On-going   ° Target Date   08/21/21   °  ° OT LONG TERM GOAL #6  ° Title Pt will demonstrate ability to grasp/ release a cup 2/3 trials with RUE.   ° Baseline 12/13/20:  Continues to work on consistency with grasp and release 10/31/2020: Pt. continues to initiate grasp, and active release of objects, however is unable to grasp/release a cup. 08/08/20: Able to grasp, unable to release x1 trial; 03/05/21: Able to grasp/release cup with min A to keep cup from tipping over.05/18/2021: Pt. is unable to grasp items secondary to changes in the right hand  following Botox injections. Pt. has 2nd,a nd 3rd digit PIP extensor tightness.   ° Time 12   ° Period Weeks   ° Status On-going   ° Target Date 08/21/21   °  ° OT LONG TERM GOAL #7  ° Title Pt will demonstrate the ability to place clothing on hangers with use of bilateral UE and modified technique.   ° Baseline unable; 03/07/21: min-mod A   ° Time 12   ° Period Weeks   ° Status On-going   ° Target Date 08/21/21   °  ° OT LONG TERM GOAL #8  ° Title I with updated HEP.   ° Baseline 12/13/2020: Continual changes to HEP 09/12/2020: Pt. continues to require assist for her HEP. Pt. requires assist, and cuing for HEP. 08/08/20: reports completing with husband assist for technique; 03/07/21: min vc. 05/18/2021: Pt. requires assist with HEPs, Husband assists with HEP.   ° Time 12   ° Period Weeks   ° Status Partially Met   ° Target Date 08/21/21   °  ° OT LONG TERM GOAL  #9  ° TITLE Pt will demonstrate the ability to don and doff robe after shower with modified independence   ° Baseline unable; 03/07/21: pt reports she avoids her robe (too heavy); uses towel instead.   ° Time 12   ° Period Weeks   ° Status Deferred   °  ° OT LONG TERM GOAL  #10  ° TITLE Pt will demonstrate the ability to perform zippers with modified independence and use of adaptive equipment as needed.   ° Baseline difficulty; 03/07/21: cues to modify zippers with string loops (has not yet tried) 05/18/2021: TBD   ° Time 12   ° Period Weeks   ° Status On-going   ° Target Date 08/21/21   ° °  °  ° °  ° ° ° ° ° ° ° ° Plan - 07/03/21 1205   ° ° Clinical Impression Statement Since last Botox injection, pt has demonstrated and increased amount of finger extension in right index and middle fingers.  She is unable to demonstrate active flexion in these digits and also has difficulty with tolerating passive ROM by therapist.  Paraffin appears to help with ROM however, pt requires sensitive setting with temp decreased to around 118-120 range.  She performs better with  distraction from activity while therapist flexes these digits.  Will continue to problem solve ways to move digits into flexion to avoid contractures of fingers.  Continue to discuss with her the recommendation she talk with her MD about her last results with Botox  and to see what his future recommendations would be regarding any additional injections as well as placement and dosage of injections.  Will need progress update next session.   ° OT Occupational Profile and History Detailed Assessment- Review of Records and additional review of physical, cognitive, psychosocial history related to current functional   performance   ° Occupational performance deficits (Please refer to evaluation for details): ADL's;IADL's;Rest and Sleep;Work;Play;Leisure   ° Body Structure / Function / Physical Skills ADL;Balance;Coordination;Decreased knowledge of precautions;Decreased knowledge of use of DME;Dexterity;Edema;Mobility;Tone;Strength;IADL;Sensation;GMC;Gait;ROM;FMC;Flexibility;Pain;Vision;UE functional use;Endurance   ° Cognitive Skills Attention;Perception;Problem Solve;Safety Awareness;Thought   ° Rehab Potential Good   ° Clinical Decision Making Several treatment options, min-mod task modification necessary   ° Comorbidities Affecting Occupational Performance: None   ° Modification or Assistance to Complete Evaluation  Min-Moderate modification of tasks or assist with assess necessary to complete eval   ° OT Frequency 2x / week   ° OT Duration 12 weeks   ° OT Treatment/Interventions Self-care/ADL training;Ultrasound;Energy conservation;Visual/perceptual remediation/compensation;Patient/family education;DME and/or AE instruction;Aquatic Therapy;Paraffin;Gait Training;Passive range of motion;Balance training;Fluidtherapy;Electrical Stimulation;Functional Mobility Training;Splinting;Moist Heat;Therapeutic exercise;Manual Therapy;Cognitive remediation/compensation;Manual lymph drainage;Neuromuscular education;Coping strategies  training   ° Plan Pt is transferring her care to [facility] as it is closer to home. She remains very fearful of moving RUE and responds well to heat, and estim.   ° Consulted and Agree with Plan of Care Patient   ° °  °  ° °  ° ° °Patient will benefit from skilled therapeutic intervention in order to improve the following deficits and impairments:   °Body Structure / Function / Physical Skills: ADL, Balance, Coordination, Decreased knowledge of precautions, Decreased knowledge of use of DME, Dexterity, Edema, Mobility, Tone, Strength, IADL, Sensation, GMC, Gait, ROM, FMC, Flexibility, Pain, Vision, UE functional use, Endurance °Cognitive Skills: Attention, Perception, Problem Solve, Safety Awareness, Thought °  ° ° °Visit Diagnosis: °Muscle weakness (generalized) ° °Other lack of coordination ° °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, unspecified laterality (HCC) ° °Acute pain of right shoulder ° °Pain in right hand ° ° ° °Problem List °Patient Active Problem List  ° Diagnosis Date Noted  ° Dyspareunia in female 04/05/2020  ° History of abnormal cervical Pap smear 03/10/2020  ° GIB (gastrointestinal bleeding) 02/28/2020  ° Elevated BUN   ° Prediabetes   ° 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  ° Cerebral vasospasm   ° Sinus tachycardia   ° Dysphagia, post-stroke   ° Thrombocytopenia (HCC)   ° Acute blood loss anemia   ° Brain aneurysm   ° ICH (intracerebral hemorrhage) (HCC) 01/05/2020  ° History of adenomatous polyp of colon 02/07/2016  ° Anatomical narrow angle, 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

## 2021-07-05 NOTE — Therapy (Signed)
[place] MAIN [facility] SERVICES [address] [place], [place], [address] Phone: [phone]   Fax:  [phone]  Occupational Therapy Treatment/Progress Note: Progress reporting period 05/23/21-06/03/21  Patient Details  Name: Erika Stanton MRN: [id number] Date of Birth: 07-07-1955 No data recorded  Encounter Date: 07/04/2021   OT End of Session - 07/05/21 1407     Visit Number 120    Number of Visits 148    Date for OT Re-Evaluation 08/21/21    Authorization Type Progress report period starting 01/26/2021    Authorization Time Period Medicare    OT Start Time 1345    OT Stop Time 1430    OT Time Calculation (min) 45 min    Activity Tolerance Patient tolerated treatment well    Behavior During Therapy [facility] for tasks assessed/performed             Past Medical History:  Diagnosis Date   Aneurysm ([place])    Melena     Past Surgical History:  Procedure Laterality Date   CHOLECYSTECTOMY     COLONOSCOPY  11/2017   at [facility]. no recurrent polyps.  suggest repeat surveillance study 11/2022.     COLONOSCOPY W/ POLYPECTOMY  06/2014   Dr Roxy Manns at [facility].  3 adenomatoous polyps, anal fissure.     ESOPHAGOGASTRODUODENOSCOPY (EGD) WITH PROPOFOL N/A 01/27/2020   Procedure: ESOPHAGOGASTRODUODENOSCOPY (EGD) WITH PROPOFOL;  Surgeon: Mauri Pole, MD;  Location: [place] [facility];  Service: Endoscopy;  Laterality: N/A;   IR 3D INDEPENDENT WKST  01/06/2020   IR ANGIO INTRA EXTRACRAN SEL INTERNAL CAROTID BILAT MOD SED  01/06/2020   IR ANGIO VERTEBRAL SEL VERTEBRAL UNI L MOD SED  01/06/2020   IR ANGIOGRAM FOLLOW UP STUDY  01/06/2020   IR ANGIOGRAM FOLLOW UP STUDY  01/06/2020   IR ANGIOGRAM FOLLOW UP STUDY  01/06/2020   IR ANGIOGRAM FOLLOW UP STUDY  01/06/2020   IR ANGIOGRAM FOLLOW UP STUDY  01/06/2020   IR ANGIOGRAM FOLLOW UP STUDY  01/06/2020   IR ANGIOGRAM FOLLOW UP STUDY  01/06/2020   IR ANGIOGRAM FOLLOW UP STUDY  01/06/2020   IR ANGIOGRAM FOLLOW UP STUDY   01/06/2020   IR ANGIOGRAM FOLLOW UP STUDY  01/06/2020   IR NEURO EACH ADD'L AFTER BASIC UNI RIGHT (MS)  01/06/2020   IR TRANSCATH/EMBOLIZ  01/06/2020   RADIOLOGY WITH ANESTHESIA N/A 01/06/2020   Procedure: IR WITH ANESTHESIA FOR ANEURYSM;  Surgeon: Consuella Lose, MD;  Location: [place];  Service: Radiology;  Laterality: N/A;   TUBAL LIGATION      There were no vitals filed for this visit.   Subjective Assessment - 07/04/21 1404     Subjective  Pt verbalized that she would make sure to go for a follow up in March to see the doctor that gave her Botox.    Patient is accompanied by: Family member    Pertinent History ICH 01/06/20, Aneurysm. PMH: OA bilateral knees and hands    Repetition Decreases Symptoms    Patient Stated Goals Pt would like to be as independent as possible.    Currently in Pain? Yes    Pain Score 3     Pain Location Hand    Pain Orientation Right    Pain Descriptors / Indicators Aching;Sharp;Tightness    Pain Type Chronic pain    Pain Radiating Towards shoulder, hand    Pain Onset More than a month ago    Pain Frequency Intermittent    Aggravating Factors  with attempts at finger flexion    Pain Relieving Factors rest/heat/gentle stretch    Effect of Pain on Daily Activities decreaed activity tolerance when using RUE    Multiple Pain Sites No    Pain Onset More than a month ago            Occupational Therapy Treatment/Progress note: Moist heat applied to R shoulder and R hand/forearm x 5 min for pain reduction/muscle relaxation in prep for therapeutic exercise.   Therapeutic Exercise/Progress towards goals: Strength and coordination measures taken and goals updated; see note for details.  Performed slow, passive stretching for R IF and LF MP, PIP, and DIP flexion, and passive extension stretching for RF and SF at MP and PIP joints to tolerance.  Made attempts at active digit blocking exercises for RF and LF DIP and PIP joints with only trace movement.  R hand  digits noted above have been very stiff since Botox in Dec.  Pt does report that spouse stretches her hand daily.  OT provided positive reinforcement to continue daily stretching, but encouraged pt increase frequency to at least 3x per day, which is likely better carried over with self PROM for 2 of the 3 sessions.  OT encouraged pt ensure that she follow up with MD next month to go over how her last Botox injections affected her hand.  Pt verbalized that she does plan to go.  Anticipate that pt may need a ball splint or similar to provide prolonged stretch for extensor tightness of 2nd and 3rd digits in R hand, as pt has not made any significant AROM or PROM gains in R hand over the last month which has prevented pt from grasp/release patterns with R hand for self care tasks.  Will review recommendations for this with pt next session.       OT Education - 07/04/21 1407     Education Details Increasing frequency for stretching R hand throughout the day; prolonged stretch for index and middle finger for flexion    Person(s) Educated Patient    Methods Explanation;Verbal cues;Demonstration    Comprehension Verbalized understanding;Returned demonstration;Tactile cues required;Verbal cues required;Need further instruction              OT Short Term Goals - 07/04/21 1355       OT SHORT TERM GOAL #1   Title I with initial HEP.    Time 6    Period Weeks    Status Achieved    Target Date 04/13/20      OT SHORT TERM GOAL #4   Title Pt will demonstrate 60* A/ROM shoulder flexion for RUE with pain less than or equal to 4/10.    Baseline Pt. presents with limited isolated  right shoulder flexion with pain continues to be 2. 08/08/20: 40* AROM with 3/10 pain; 03/07/21: Pt demos 75* active shoulder flexion with 2/10 pain or less. 40th visit: Pt. continues to present with limited shoulder ROM, and pain has decreased; 07/04/21 R shoulder active flexion 85, 3/10 pain.    Time 4    Period Weeks     Status On-going    Target Date 08/21/21      OT SHORT TERM GOAL #5   Title Pt will demonstrate improved LUE fine motor coordination for ADLs as evidenced by decreasing 9 hole peg test score to 25 secs or less.    Baseline 10/31/2020: Left FMC: 32 sec. Left FMC 41 sec visa the 9 hole peg test. 08/08/20: 32 seconds via  9 hole peg 40th visit: Left hand: 35 sec.; 07/04/21: L hand 28 sec    Time 12    Period Weeks    Status On-going    Target Date 08/21/21      OT SHORT TERM GOAL #7   Title Pt will demonstrate at least 25% finger flexion/ extension in RUE in prep for functional use.    Baseline Limited PROM, and active digit flexion, and extension. Improved AROM for right 2nd, and 3rd digits. significant limitations with the 4th, and 5th digits. 08/08/20: AROM digit MCP 2nd (80*), 3rd (85*), 4th (80*), 5th (90*); 2/21/2: Since Botox injection in Dec, R hand has had limited active movement; see note for details    Time 4    Period Weeks    Status On-going    Target Date 09/05/20               OT Long Term Goals - 07/05/21 1417       OT LONG TERM GOAL #3   Title Pt will use RUE as a stabilizer/ gross A at least 30 % of the time for ADLs/ IADLs.    Baseline Pt. is using her right hand as a gross stabilizer 15% of the time. 08/08/20: requires MAX cues; 03/07/21 Pt using R hand as a gross stabilizer at least 25% of the time. 40th visit: Pt. has difficulty using her right hand  as a gross stabilizer; 07/04/21: Pt uses RUE as a stabilizer 40-50% of the time for ADLs/IADLs.    Time 12    Period Weeks    Status On-going    Target Date 08/21/21      OT LONG TERM GOAL #4   Title Pt will increase LUE grip strength to 25 lbs or greater for increased ease with ADLs.    Baseline 12/13/20: 24# left, 1# right 10/31/2020: Pt. continues to present with limited left grip. Pt. has improved with left grip strength. Pt.continues to work towards 25#. 08/08/20: L grip 19#; 03/07/21: L grip 21 lbs (R 1 lb) 05/18/2021:  Left grip strength: 30#; 07/04/21:L grip 21#, R 0#    Time 12    Period Weeks    Status On-going    Target Date 08/21/21      OT LONG TERM GOAL #5   Title 10/31/2020: Pt. is now perfroming laundry, loading the dishwasher, assists some with cooking, light meal preparation, setting the table    Baseline 12/13/2020:  continues to assist as needed but uses left hand .  10/13/20: Pt. assists with filling the dishwasher, and performing laundry. Pt. continues to try to help when she can, however her husband mostly performs these tasks. 08/08/20: Husband performs; 03/07/21: Pt assists with laundry, loading dishwasher, light cooking/meal prep; mostly using LUE. 05/18/2021: pt. continues to assist with laundry skills, loading the dishwasher, light cooking using the left hand; 07/04/21: using L hand for above noted tasks    Time 12    Period Weeks    Status On-going    Target Date 08/21/21      OT LONG TERM GOAL #6   Title Pt will demonstrate ability to grasp/ release a cup 2/3 trials with RUE.    Baseline 12/13/20:  Continues to work on consistency with grasp and release 10/31/2020: Pt. continues to initiate grasp, and active release of objects, however is unable to grasp/release a cup. 08/08/20: Able to grasp, unable to release x1 trial; 03/05/21: Able to grasp/release cup with min A to keep cup  from tipping over.05/18/2021: Pt. is unable to grasp items secondary to changes in the right hand following Botox injections. Pt. has 2nd,a nd 3rd digit PIP extensor tightness; 07/04/21: continues with extensor tightness in 2nd and 3rd digit, and flexor tightness in 4th and 5th digit MCPs    Time 12    Period Weeks    Status On-going    Target Date 08/21/21      OT LONG TERM GOAL #7   Title Pt will demonstrate the ability to place clothing on hangers with use of bilateral UE and modified technique.    Baseline Eval: unable, 03/07/21: min-mod A; 07/04/21: predominantly manages wtih LUE    Time 12    Period Weeks    Status  On-going    Target Date 08/21/21      OT LONG TERM GOAL #8   Title I with updated HEP.    Baseline 12/13/2020: Continual changes to HEP 09/12/2020: Pt. continues to require assist for her HEP. Pt. requires assist, and cuing for HEP. 08/08/20: reports completing with husband assist for technique; 03/07/21: min vc. 05/18/2021: Pt. requires assist with HEPs, Husband assists with HEP; 07/04/21: vc to increase frequency with stretching sessions for R hand; needing several times per day, not just once    Time 12    Period Weeks    Status Partially Met    Target Date 08/21/21      OT LONG TERM GOAL  #10   TITLE Pt will demonstrate the ability to perform zippers with modified independence and use of adaptive equipment as needed.    Baseline difficulty; 03/07/21: cues to modify zippers with string loops (has not yet tried) 05/18/2021: TBD; 07/04/21: assist needed with zippers    Time 12    Period Weeks    Status On-going    Target Date 08/21/21              Plan - 07/04/21 1445     Clinical Impression Statement Strength and coordination measures taken and goals updated; see note for details.  Performed slow, passive stretching for R IF and LF MP, PIP, and DIP flexion, and passive extension stretching for RF and SF at MP and PIP joints to tolerance.  Made attempts at active digit blocking exercises for RF and LF DIP and PIP joints with only trace movement.  R hand digits noted above have been very stiff since Botox in Dec.  Pt does report that spouse stretches her hand daily.  OT provided positive reinforcement to continue daily stretching, but encouraged pt increase frequency to at least 3x per day, which is likely better carried over with self PROM for 2 of the 3 sessions.  OT encouraged pt ensure that she follow up with MD next month to go over how her last Botox injections affected her hand.  Pt verbalized that she does plan to go.  Anticipate that pt may need a ball splint or similar to provide  prolonged stretch for extensor tightness of 2nd and 3rd digits in R hand, as pt has not made any significant AROM or PROM gains in R hand over the last month.  Will review recommendations for this with pt next session.    OT Occupational Profile and History Detailed Assessment- Review of Records and additional review of physical, cognitive, psychosocial history related to current functional performance    Occupational performance deficits (Please refer to evaluation for details): ADL's;IADL's;Rest and Sleep;Work;Play;Leisure    Body Structure / Function / Physical Skills  ADL;Balance;Coordination;Decreased knowledge of precautions;Decreased knowledge of use of DME;Dexterity;Edema;Mobility;Tone;Strength;IADL;Sensation;GMC;Gait;ROM;FMC;Flexibility;Pain;Vision;UE functional use;Endurance    Cognitive Skills Attention;Perception;Problem Solve;Safety Awareness;Thought    Rehab Potential Good    Clinical Decision Making Several treatment options, min-mod task modification necessary    Comorbidities Affecting Occupational Performance: None    Modification or Assistance to Complete Evaluation  Min-Moderate modification of tasks or assist with assess necessary to complete eval    OT Frequency 2x / week    OT Duration 12 weeks    OT Treatment/Interventions Self-care/ADL training;Ultrasound;Energy conservation;Visual/perceptual remediation/compensation;Patient/family education;DME and/or AE instruction;Aquatic Therapy;Paraffin;Gait Training;Passive range of motion;Balance training;Fluidtherapy;Electrical Stimulation;Functional Mobility Training;Splinting;Moist Heat;Therapeutic exercise;Manual Therapy;Cognitive remediation/compensation;Manual lymph drainage;Neuromuscular education;Coping strategies training    Plan Pt is transferring her care to [facility] as it is closer to home. She remains very fearful of moving RUE and responds well to heat, and estim.    Consulted and Agree with Plan of Care Patient              Patient will benefit from skilled therapeutic intervention in order to improve the following deficits and impairments:   Body Structure / Function / Physical Skills: ADL, Balance, Coordination, Decreased knowledge of precautions, Decreased knowledge of use of DME, Dexterity, Edema, Mobility, Tone, Strength, IADL, Sensation, GMC, Gait, ROM, FMC, Flexibility, Pain, Vision, UE functional use, Endurance Cognitive Skills: Attention, Perception, Problem Solve, Safety Awareness, Thought     Visit Diagnosis: Apraxia  Muscle weakness (generalized)  Other lack of coordination    Problem List Patient Active Problem List   Diagnosis Date Noted   Dyspareunia in female 04/05/2020   History of abnormal cervical Pap smear 03/10/2020   GIB (gastrointestinal bleeding) 02/28/2020   Elevated BUN    Prediabetes    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   Cerebral vasospasm    Sinus tachycardia    Dysphagia, post-stroke    Thrombocytopenia ([facility])    Acute blood loss anemia    Brain aneurysm    ICH (intracerebral hemorrhage) ([place]) 01/05/2020   History of adenomatous polyp of colon 02/07/2016   Anatomical narrow angle, 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 [place], [place], [address] Phone: [phone]   Fax:  [phone]  Name: Erika Stanton MRN: [id number] Date of Birth: 11-30-1955

## 2021-07-06 ENCOUNTER — Ambulatory Visit: Payer: Medicare PPO | Admitting: Occupational Therapy

## 2021-07-06 ENCOUNTER — Other Ambulatory Visit: Payer: Self-pay

## 2021-07-06 DIAGNOSIS — M6281 Muscle weakness (generalized): Secondary | ICD-10-CM | POA: Diagnosis not present

## 2021-07-06 DIAGNOSIS — I69159 Hemiplegia and hemiparesis following nontraumatic intracerebral hemorrhage affecting unspecified side: Secondary | ICD-10-CM

## 2021-07-06 DIAGNOSIS — R278 Other lack of coordination: Secondary | ICD-10-CM

## 2021-07-06 DIAGNOSIS — M25511 Pain in right shoulder: Secondary | ICD-10-CM

## 2021-07-06 DIAGNOSIS — M79641 Pain in right hand: Secondary | ICD-10-CM

## 2021-07-07 ENCOUNTER — Encounter: Payer: Self-pay | Admitting: Occupational Therapy

## 2021-07-07 NOTE — Therapy (Signed)
[place] MAIN [facility] SERVICES [address] [place], [place], [address] Phone: [phone]   Fax:  [phone]  Occupational Therapy Treatment  Patient Details  Name: Erika Stanton MRN: [id number] Date of Birth: May 28, 1955 No data recorded  Encounter Date: 07/06/2021   OT End of Session - 07/07/21 0904     Visit Number 121    Number of Visits 148    Date for OT Re-Evaluation 08/21/21    Authorization Type Progress report period starting 01/26/2021    Authorization Time Period Medicare    OT Start Time 1259    OT Stop Time 1345    OT Time Calculation (min) 46 min    Activity Tolerance Patient tolerated treatment well    Behavior During Therapy [facility] for tasks assessed/performed             Past Medical History:  Diagnosis Date   Aneurysm ([place])    Melena     Past Surgical History:  Procedure Laterality Date   CHOLECYSTECTOMY     COLONOSCOPY  11/2017   at [facility]. no recurrent polyps.  suggest repeat surveillance study 11/2022.     COLONOSCOPY W/ POLYPECTOMY  06/2014   Dr Roxy Manns at [facility].  3 adenomatoous polyps, anal fissure.     ESOPHAGOGASTRODUODENOSCOPY (EGD) WITH PROPOFOL N/A 01/27/2020   Procedure: ESOPHAGOGASTRODUODENOSCOPY (EGD) WITH PROPOFOL;  Surgeon: Mauri Pole, MD;  Location: [facility] [facility];  Service: Endoscopy;  Laterality: N/A;   IR 3D INDEPENDENT WKST  01/06/2020   IR ANGIO INTRA EXTRACRAN SEL INTERNAL CAROTID BILAT MOD SED  01/06/2020   IR ANGIO VERTEBRAL SEL VERTEBRAL UNI L MOD SED  01/06/2020   IR ANGIOGRAM FOLLOW UP STUDY  01/06/2020   IR ANGIOGRAM FOLLOW UP STUDY  01/06/2020   IR ANGIOGRAM FOLLOW UP STUDY  01/06/2020   IR ANGIOGRAM FOLLOW UP STUDY  01/06/2020   IR ANGIOGRAM FOLLOW UP STUDY  01/06/2020   IR ANGIOGRAM FOLLOW UP STUDY  01/06/2020   IR ANGIOGRAM FOLLOW UP STUDY  01/06/2020   IR ANGIOGRAM FOLLOW UP STUDY  01/06/2020   IR ANGIOGRAM FOLLOW UP STUDY  01/06/2020   IR ANGIOGRAM FOLLOW UP STUDY  01/06/2020   IR  NEURO EACH ADD'L AFTER BASIC UNI RIGHT (MS)  01/06/2020   IR TRANSCATH/EMBOLIZ  01/06/2020   RADIOLOGY WITH ANESTHESIA N/A 01/06/2020   Procedure: IR WITH ANESTHESIA FOR ANEURYSM;  Surgeon: Consuella Lose, MD;  Location: [place];  Service: Radiology;  Laterality: N/A;   TUBAL LIGATION      There were no vitals filed for this visit.   Subjective Assessment - 07/07/21 0903     Subjective  Pt reports she and her husband are still working on her ROM daily.  Wearing splint at night.    Patient is accompanied by: Family member    Pertinent History ICH 01/06/20, Aneurysm. PMH: OA bilateral knees and hands    Repetition Decreases Symptoms    Patient Stated Goals Pt would like to be as independent as possible.    Currently in Pain? Yes    Pain Score 2     Pain Location Hand    Pain Orientation Right    Pain Descriptors / Indicators Aching;Tightness    Pain Type Chronic pain    Pain Onset More than a month ago    Pain Frequency Intermittent    Pain Onset More than a month ago            Moist heat applied  to right shoulder, hand and wrist for 5 mins prior to therapeutic exercise to decrease pain, increase motion and increase tissue mobility .  Therapeutic Exercises: Performed slow, passive stretching for R IF and LF MP, PIP, and DIP flexion, and passive extension stretching for RF and SF at MP and PIP joints to tolerance.  Continued attempts at active digit blocking exercises for RF and LF DIP and PIP joints with only trace movement. Use of medium ball to right palm and then utilized coban to wrap digits around ball for prolonged flexion stretch.  Pt continues to demonstrate difficulty with picking up items with right hand, continues to use almost a duck bill grasp (index and middle finger extended with thumb under) to pick up items, unable to pick up smaller items such as coins, beans, small pegs, etc.    Discussed recommendation for ball splint for extensor tightness in right hand  especially for index and middle fingers which have demonstrated increased tightness in extension since Botox in December.    Response to tx: Discussed need for another type of splint for right hand,  Potentially a ball splint to help promote flexion in 2nd and 3rd digits. Therapist to find a picture of splint for next session to share, costs and options for places to purchase.  Continue to recommend daily range of motion with emphasis on flexion at MP, PIP and DIP joints of index and middle fingers.  Pt to follow up with MD regarding Botox and results from last injection and to see recommendations if another round is indicated.                         OT Education - 07/07/21 0904     Education Details Increasing frequency for stretching R hand throughout the day; prolonged stretch for index and middle finger for flexion    Person(s) Educated Patient    Methods Explanation;Verbal cues;Demonstration    Comprehension Verbalized understanding;Returned demonstration;Tactile cues required;Verbal cues required;Need further instruction              OT Short Term Goals - 07/04/21 1355       OT SHORT TERM GOAL #1   Title I with initial HEP.    Time 6    Period Weeks    Status Achieved    Target Date 04/13/20      OT SHORT TERM GOAL #4   Title Pt will demonstrate 60* A/ROM shoulder flexion for RUE with pain less than or equal to 4/10.    Baseline Pt. presents with limited isolated  right shoulder flexion with pain continues to be 2. 08/08/20: 40* AROM with 3/10 pain; 03/07/21: Pt demos 75* active shoulder flexion with 2/10 pain or less. 40th visit: Pt. continues to present with limited shoulder ROM, and pain has decreased; 07/04/21 R shoulder active flexion 85, 3/10 pain.    Time 4    Period Weeks    Status On-going    Target Date 08/21/21      OT SHORT TERM GOAL #5   Title Pt will demonstrate improved LUE fine motor coordination for ADLs as evidenced by decreasing 9 hole  peg test score to 25 secs or less.    Baseline 10/31/2020: Left FMC: 32 sec. Left FMC 41 sec visa the 9 hole peg test. 08/08/20: 32 seconds via 9 hole peg 40th visit: Left hand: 35 sec.; 07/04/21: L hand 28 sec    Time 12    Period Weeks  Status On-going    Target Date 08/21/21      OT SHORT TERM GOAL #7   Title Pt will demonstrate at least 25% finger flexion/ extension in RUE in prep for functional use.    Baseline Limited PROM, and active digit flexion, and extension. Improved AROM for right 2nd, and 3rd digits. significant limitations with the 4th, and 5th digits. 08/08/20: AROM digit MCP 2nd (80*), 3rd (85*), 4th (80*), 5th (90*); 2/21/2: Since Botox injection in Dec, R hand has had limited active movement; see note for details    Time 4    Period Weeks    Status On-going    Target Date 09/05/20               OT Long Term Goals - 07/05/21 1417       OT LONG TERM GOAL #3   Title Pt will use RUE as a stabilizer/ gross A at least 30 % of the time for ADLs/ IADLs.    Baseline Pt. is using her right hand as a gross stabilizer 15% of the time. 08/08/20: requires MAX cues; 03/07/21 Pt using R hand as a gross stabilizer at least 25% of the time. 40th visit: Pt. has difficulty using her right hand  as a gross stabilizer; 07/04/21: Pt uses RUE as a stabilizer 40-50% of the time for ADLs/IADLs.    Time 12    Period Weeks    Status On-going    Target Date 08/21/21      OT LONG TERM GOAL #4   Title Pt will increase LUE grip strength to 25 lbs or greater for increased ease with ADLs.    Baseline 12/13/20: 24# left, 1# right 10/31/2020: Pt. continues to present with limited left grip. Pt. has improved with left grip strength. Pt.continues to work towards 25#. 08/08/20: L grip 19#; 03/07/21: L grip 21 lbs (R 1 lb) 05/18/2021: Left grip strength: 30#; 07/04/21:L grip 21#, R 0#    Time 12    Period Weeks    Status On-going    Target Date 08/21/21      OT LONG TERM GOAL #5   Title 10/31/2020: Pt. is  now perfroming laundry, loading the dishwasher, assists some with cooking, light meal preparation, setting the table    Baseline 12/13/2020:  continues to assist as needed but uses left hand .  10/13/20: Pt. assists with filling the dishwasher, and performing laundry. Pt. continues to try to help when she can, however her husband mostly performs these tasks. 08/08/20: Husband performs; 03/07/21: Pt assists with laundry, loading dishwasher, light cooking/meal prep; mostly using LUE. 05/18/2021: pt. continues to assist with laundry skills, loading the dishwasher, light cooking using the left hand; 07/04/21: using L hand for above noted tasks    Time 12    Period Weeks    Status On-going    Target Date 08/21/21      OT LONG TERM GOAL #6   Title Pt will demonstrate ability to grasp/ release a cup 2/3 trials with RUE.    Baseline 12/13/20:  Continues to work on consistency with grasp and release 10/31/2020: Pt. continues to initiate grasp, and active release of objects, however is unable to grasp/release a cup. 08/08/20: Able to grasp, unable to release x1 trial; 03/05/21: Able to grasp/release cup with min A to keep cup from tipping over.05/18/2021: Pt. is unable to grasp items secondary to changes in the right hand following Botox injections. Pt. has 2nd,a nd 3rd digit PIP extensor  tightness; 07/04/21: continues with extensor tightness in 2nd and 3rd digit, and flexor tightness in 4th and 5th digit MCPs    Time 12    Period Weeks    Status On-going    Target Date 08/21/21      OT LONG TERM GOAL #7   Title Pt will demonstrate the ability to place clothing on hangers with use of bilateral UE and modified technique.    Baseline Eval: unable, 03/07/21: min-mod A; 07/04/21: predominantly manages wtih LUE    Time 12    Period Weeks    Status On-going    Target Date 08/21/21      OT LONG TERM GOAL #8   Title I with updated HEP.    Baseline 12/13/2020: Continual changes to HEP 09/12/2020: Pt. continues to require  assist for her HEP. Pt. requires assist, and cuing for HEP. 08/08/20: reports completing with husband assist for technique; 03/07/21: min vc. 05/18/2021: Pt. requires assist with HEPs, Husband assists with HEP; 07/04/21: vc to increase frequency with stretching sessions for R hand; needing several times per day, not just once    Time 12    Period Weeks    Status Partially Met    Target Date 08/21/21      OT LONG TERM GOAL  #10   TITLE Pt will demonstrate the ability to perform zippers with modified independence and use of adaptive equipment as needed.    Baseline difficulty; 03/07/21: cues to modify zippers with string loops (has not yet tried) 05/18/2021: TBD; 07/04/21: assist needed with zippers    Time 12    Period Weeks    Status On-going    Target Date 08/21/21                   Plan - 07/07/21 0905     Clinical Impression Statement Discussed need for another type of splint for right hand,   Potentially a ball splint to help promote flexion in 2nd and 3rd digits. Therapist to find a picture of splint for next session to share, costs and options for places to purchase.  Continue to recommend daily range of motion with emphasis on flexion at MP, PIP and DIP joints of index and middle fingers.  Pt to follow up with MD regarding Botox and results from last injection and to see recommendations if another round is indicated.    OT Occupational Profile and History Detailed Assessment- Review of Records and additional review of physical, cognitive, psychosocial history related to current functional performance    Occupational performance deficits (Please refer to evaluation for details): ADL's;IADL's;Rest and Sleep;Work;Play;Leisure    Body Structure / Function / Physical Skills ADL;Balance;Coordination;Decreased knowledge of precautions;Decreased knowledge of use of DME;Dexterity;Edema;Mobility;Tone;Strength;IADL;Sensation;GMC;Gait;ROM;FMC;Flexibility;Pain;Vision;UE functional use;Endurance     Cognitive Skills Attention;Perception;Problem Solve;Safety Awareness;Thought    Rehab Potential Good    Clinical Decision Making Several treatment options, min-mod task modification necessary    Comorbidities Affecting Occupational Performance: None    Modification or Assistance to Complete Evaluation  Min-Moderate modification of tasks or assist with assess necessary to complete eval    OT Frequency 2x / week    OT Duration 12 weeks    OT Treatment/Interventions Self-care/ADL training;Ultrasound;Energy conservation;Visual/perceptual remediation/compensation;Patient/family education;DME and/or AE instruction;Aquatic Therapy;Paraffin;Gait Training;Passive range of motion;Balance training;Fluidtherapy;Electrical Stimulation;Functional Mobility Training;Splinting;Moist Heat;Therapeutic exercise;Manual Therapy;Cognitive remediation/compensation;Manual lymph drainage;Neuromuscular education;Coping strategies training    Plan Pt is transferring her care to [facility] as it is closer to home. She remains very fearful of moving RUE and  responds well to heat, and estim.    Consulted and Agree with Plan of Care Patient             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, unspecified laterality (HCC)  Acute pain of right shoulder  Pain in right hand    Problem List Patient Active Problem List   Diagnosis  Date Noted   Dyspareunia in female 04/05/2020   History of abnormal cervical Pap smear 03/10/2020   GIB (gastrointestinal bleeding) 02/28/2020   Elevated BUN    Prediabetes    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[place]) 01/20/2020   Cerebral vasospasm    Sinus tachycardia    Dysphagia, post-stroke    Thrombocytopenia (HCC)    Acute blood loss anemia    Brain aneurysm    ICH (intracerebral hemorrhage) ([place]) 01/05/2020   History of adenomatous polyp of colon 02/07/2016   Anatomical narrow angle, 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 [place], [place], [address] Phone: [phone]   Fax:  [phone]  Name: Erika Stanton MRN: [id number] Date of Birth: 1955-10-07

## 2021-07-11 ENCOUNTER — Encounter: Payer: Self-pay | Admitting: Occupational Therapy

## 2021-07-11 ENCOUNTER — Ambulatory Visit: Payer: Medicare PPO | Admitting: Occupational Therapy

## 2021-07-11 ENCOUNTER — Other Ambulatory Visit: Payer: Self-pay

## 2021-07-11 DIAGNOSIS — M25511 Pain in right shoulder: Secondary | ICD-10-CM

## 2021-07-11 DIAGNOSIS — M6281 Muscle weakness (generalized): Secondary | ICD-10-CM

## 2021-07-11 DIAGNOSIS — R278 Other lack of coordination: Secondary | ICD-10-CM

## 2021-07-11 DIAGNOSIS — I69159 Hemiplegia and hemiparesis following nontraumatic intracerebral hemorrhage affecting unspecified side: Secondary | ICD-10-CM

## 2021-07-11 DIAGNOSIS — M79641 Pain in right hand: Secondary | ICD-10-CM

## 2021-07-11 NOTE — Therapy (Signed)
[place] MAIN [facility] SERVICES [address] [place], [place], [address] Phone: [phone]   Fax:  [phone]  Occupational Therapy Treatment  Patient Details  Name: Erika Stanton MRN: [id number] Date of Birth: Nov 18, 1955 No data recorded  Encounter Date: 07/11/2021   OT End of Session - 07/11/21 1311     Visit Number 122    Number of Visits 148    Date for OT Re-Evaluation 08/21/21    Authorization Type Progress report period starting 01/26/2021    Authorization Time Period Medicare    OT Start Time 1300    OT Stop Time 1346    OT Time Calculation (min) 46 min    Activity Tolerance Patient tolerated treatment well    Behavior During Therapy [facility] for tasks assessed/performed             Past Medical History:  Diagnosis Date   Aneurysm ([place])    Melena     Past Surgical History:  Procedure Laterality Date   CHOLECYSTECTOMY     COLONOSCOPY  11/2017   at [facility]. no recurrent polyps.  suggest repeat surveillance study 11/2022.     COLONOSCOPY W/ POLYPECTOMY  06/2014   Dr Roxy Manns at [facility].  3 adenomatoous polyps, anal fissure.     ESOPHAGOGASTRODUODENOSCOPY (EGD) WITH PROPOFOL N/A 01/27/2020   Procedure: ESOPHAGOGASTRODUODENOSCOPY (EGD) WITH PROPOFOL;  Surgeon: Mauri Pole, MD;  Location: [facility];  Service: Endoscopy;  Laterality: N/A;   IR 3D INDEPENDENT WKST  01/06/2020   IR ANGIO INTRA EXTRACRAN SEL INTERNAL CAROTID BILAT MOD SED  01/06/2020   IR ANGIO VERTEBRAL SEL VERTEBRAL UNI L MOD SED  01/06/2020   IR ANGIOGRAM FOLLOW UP STUDY  01/06/2020   IR ANGIOGRAM FOLLOW UP STUDY  01/06/2020   IR ANGIOGRAM FOLLOW UP STUDY  01/06/2020   IR ANGIOGRAM FOLLOW UP STUDY  01/06/2020   IR ANGIOGRAM FOLLOW UP STUDY  01/06/2020   IR ANGIOGRAM FOLLOW UP STUDY  01/06/2020   IR ANGIOGRAM FOLLOW UP STUDY  01/06/2020   IR ANGIOGRAM FOLLOW UP STUDY  01/06/2020   IR ANGIOGRAM FOLLOW UP STUDY  01/06/2020   IR ANGIOGRAM FOLLOW UP STUDY  01/06/2020   IR  NEURO EACH ADD'L AFTER BASIC UNI RIGHT (MS)  01/06/2020   IR TRANSCATH/EMBOLIZ  01/06/2020   RADIOLOGY WITH ANESTHESIA N/A 01/06/2020   Procedure: IR WITH ANESTHESIA FOR ANEURYSM;  Surgeon: Consuella Lose, MD;  Location: [place];  Service: Radiology;  Laterality: N/A;   TUBAL LIGATION      There were no vitals filed for this visit.   Subjective Assessment - 07/11/21 1308     Subjective  Pt reports she went to get her nails done but messed up the left one when she was pulling up her pants.    Pertinent History ICH 01/06/20, Aneurysm. PMH: OA bilateral knees and hands    Patient Stated Goals Pt would like to be as independent as possible.    Currently in Pain? Yes            Pt reports she got a cortisone shot in right knee on Friday, hoping it will help with the pain.  Moist heat applied to right shoulder, hand and wrist for 5 mins prior to therapeutic exercise to decrease pain, increase motion and increase tissue mobility .   Therapeutic Exercises: Performed slow, passive stretching for R IF and LF MP, PIP, and DIP flexion, and passive extension stretching for RF and SF at MP  and PIP joints to tolerance.  Continued attempts at active digit blocking exercises for RF and LF DIP and PIP joints with only trace movement. Use of wide coban to wrap hand and digits for prolonged flexion stretch.   Discussed option of another splint such as a ball splint however, we also discussed the use of a flexion glove which may help patient more to increase flexion in the right hand for index and long finger and will be adjustable.  Therapist to check with hand clinic to see if there is one available to use during next treatment session.    Response to tx: Pt with continued spasticity and remains at risk for contractures in right hand.  She responds well to prolonged stretching and use of coban today and would likely benefit from a flexion glove.  Will attempt to facilitate for next session and pt will be  able to take glove home to use as a part of her home program.  Continue to work towards goals in plan of care to improve ROM, decrease pain and increase function for daily tasks.                      OT Education - 07/11/21 1310     Education Details home program for ROM, stretching    Person(s) Educated Patient    Methods Explanation;Verbal cues;Demonstration    Comprehension Verbalized understanding;Returned demonstration;Tactile cues required;Verbal cues required;Need further instruction              OT Short Term Goals - 07/04/21 1355       OT SHORT TERM GOAL #1   Title I with initial HEP.    Time 6    Period Weeks    Status Achieved    Target Date 04/13/20      OT SHORT TERM GOAL #4   Title Pt will demonstrate 60* A/ROM shoulder flexion for RUE with pain less than or equal to 4/10.    Baseline Pt. presents with limited isolated  right shoulder flexion with pain continues to be 2. 08/08/20: 40* AROM with 3/10 pain; 03/07/21: Pt demos 75* active shoulder flexion with 2/10 pain or less. 40th visit: Pt. continues to present with limited shoulder ROM, and pain has decreased; 07/04/21 R shoulder active flexion 85, 3/10 pain.    Time 4    Period Weeks    Status On-going    Target Date 08/21/21      OT SHORT TERM GOAL #5   Title Pt will demonstrate improved LUE fine motor coordination for ADLs as evidenced by decreasing 9 hole peg test score to 25 secs or less.    Baseline 10/31/2020: Left FMC: 32 sec. Left FMC 41 sec visa the 9 hole peg test. 08/08/20: 32 seconds via 9 hole peg 40th visit: Left hand: 35 sec.; 07/04/21: L hand 28 sec    Time 12    Period Weeks    Status On-going    Target Date 08/21/21      OT SHORT TERM GOAL #7   Title Pt will demonstrate at least 25% finger flexion/ extension in RUE in prep for functional use.    Baseline Limited PROM, and active digit flexion, and extension. Improved AROM for right 2nd, and 3rd digits. significant limitations  with the 4th, and 5th digits. 08/08/20: AROM digit MCP 2nd (80*), 3rd (85*), 4th (80*), 5th (90*); 2/21/2: Since Botox injection in Dec, R hand has had limited active movement; see note for  details    Time 4    Period Weeks    Status On-going    Target Date 09/05/20               OT Long Term Goals - 07/05/21 1417       OT LONG TERM GOAL #3   Title Pt will use RUE as a stabilizer/ gross A at least 30 % of the time for ADLs/ IADLs.    Baseline Pt. is using her right hand as a gross stabilizer 15% of the time. 08/08/20: requires MAX cues; 03/07/21 Pt using R hand as a gross stabilizer at least 25% of the time. 40th visit: Pt. has difficulty using her right hand  as a gross stabilizer; 07/04/21: Pt uses RUE as a stabilizer 40-50% of the time for ADLs/IADLs.    Time 12    Period Weeks    Status On-going    Target Date 08/21/21      OT LONG TERM GOAL #4   Title Pt will increase LUE grip strength to 25 lbs or greater for increased ease with ADLs.    Baseline 12/13/20: 24# left, 1# right 10/31/2020: Pt. continues to present with limited left grip. Pt. has improved with left grip strength. Pt.continues to work towards 25#. 08/08/20: L grip 19#; 03/07/21: L grip 21 lbs (R 1 lb) 05/18/2021: Left grip strength: 30#; 07/04/21:L grip 21#, R 0#    Time 12    Period Weeks    Status On-going    Target Date 08/21/21      OT LONG TERM GOAL #5   Title 10/31/2020: Pt. is now perfroming laundry, loading the dishwasher, assists some with cooking, light meal preparation, setting the table    Baseline 12/13/2020:  continues to assist as needed but uses left hand .  10/13/20: Pt. assists with filling the dishwasher, and performing laundry. Pt. continues to try to help when she can, however her husband mostly performs these tasks. 08/08/20: Husband performs; 03/07/21: Pt assists with laundry, loading dishwasher, light cooking/meal prep; mostly using LUE. 05/18/2021: pt. continues to assist with laundry skills, loading the  dishwasher, light cooking using the left hand; 07/04/21: using L hand for above noted tasks    Time 12    Period Weeks    Status On-going    Target Date 08/21/21      OT LONG TERM GOAL #6   Title Pt will demonstrate ability to grasp/ release a cup 2/3 trials with RUE.    Baseline 12/13/20:  Continues to work on consistency with grasp and release 10/31/2020: Pt. continues to initiate grasp, and active release of objects, however is unable to grasp/release a cup. 08/08/20: Able to grasp, unable to release x1 trial; 03/05/21: Able to grasp/release cup with min A to keep cup from tipping over.05/18/2021: Pt. is unable to grasp items secondary to changes in the right hand following Botox injections. Pt. has 2nd,a nd 3rd digit PIP extensor tightness; 07/04/21: continues with extensor tightness in 2nd and 3rd digit, and flexor tightness in 4th and 5th digit MCPs    Time 12    Period Weeks    Status On-going    Target Date 08/21/21      OT LONG TERM GOAL #7   Title Pt will demonstrate the ability to place clothing on hangers with use of bilateral UE and modified technique.    Baseline Eval: unable, 03/07/21: min-mod A; 07/04/21: predominantly manages wtih LUE    Time 12  Period Weeks    Status On-going    Target Date 08/21/21      OT LONG TERM GOAL #8   Title I with updated HEP.    Baseline 12/13/2020: Continual changes to HEP 09/12/2020: Pt. continues to require assist for her HEP. Pt. requires assist, and cuing for HEP. 08/08/20: reports completing with husband assist for technique; 03/07/21: min vc. 05/18/2021: Pt. requires assist with HEPs, Husband assists with HEP; 07/04/21: vc to increase frequency with stretching sessions for R hand; needing several times per day, not just once    Time 12    Period Weeks    Status Partially Met    Target Date 08/21/21      OT LONG TERM GOAL  #10   TITLE Pt will demonstrate the ability to perform zippers with modified independence and use of adaptive equipment as  needed.    Baseline difficulty; 03/07/21: cues to modify zippers with string loops (has not yet tried) 05/18/2021: TBD; 07/04/21: assist needed with zippers    Time 12    Period Weeks    Status On-going    Target Date 08/21/21                   Plan - 07/11/21 1311     Clinical Impression Statement Pt with continued spasticity and remains at risk for contractures in right hand.  She responds well to prolonged stretching and use of coban today and would likely benefit from a flexion glove.  Will attempt to facilitate for next session and pt will be able to take glove home to use as a part of her home program.  Continue to work towards goals in plan of care to improve ROM, decrease pain and increase function for daily tasks.    OT Occupational Profile and History Detailed Assessment- Review of Records and additional review of physical, cognitive, psychosocial history related to current functional performance    Occupational performance deficits (Please refer to evaluation for details): ADL's;IADL's;Rest and Sleep;Work;Play;Leisure    Body Structure / Function / Physical Skills ADL;Balance;Coordination;Decreased knowledge of precautions;Decreased knowledge of use of DME;Dexterity;Edema;Mobility;Tone;Strength;IADL;Sensation;GMC;Gait;ROM;FMC;Flexibility;Pain;Vision;UE functional use;Endurance    Cognitive Skills Attention;Perception;Problem Solve;Safety Awareness;Thought    Rehab Potential Good    Clinical Decision Making Several treatment options, min-mod task modification necessary    Comorbidities Affecting Occupational Performance: None    Modification or Assistance to Complete Evaluation  Min-Moderate modification of tasks or assist with assess necessary to complete eval    OT Frequency 2x / week    OT Duration 12 weeks    OT Treatment/Interventions Self-care/ADL training;Ultrasound;Energy conservation;Visual/perceptual remediation/compensation;Patient/family education;DME and/or AE  instruction;Aquatic Therapy;Paraffin;Gait Training;Passive range of motion;Balance training;Fluidtherapy;Electrical Stimulation;Functional Mobility Training;Splinting;Moist Heat;Therapeutic exercise;Manual Therapy;Cognitive remediation/compensation;Manual lymph drainage;Neuromuscular education;Coping strategies training    Plan Pt is transferring her care to [facility] as it is closer to home. She remains very fearful of moving RUE and responds well to heat, and estim.    Consulted and Agree with Plan of Care Patient             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, unspecified laterality (HCC)  Acute pain of right shoulder  Pain in right hand    Problem List Patient Active Problem List   Diagnosis Date Noted   Dyspareunia in female 04/05/2020   History of abnormal cervical Pap smear 03/10/2020   GIB (gastrointestinal bleeding) 02/28/2020   Elevated BUN    Prediabetes    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   Cerebral vasospasm    Sinus tachycardia    Dysphagia, post-stroke    Thrombocytopenia (HCC)    Acute blood loss anemia    Brain aneurysm    ICH (intracerebral hemorrhage) ([place]) 01/05/2020   History of adenomatous polyp of colon 02/07/2016   Anatomical narrow angle, 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 [place], [place], [address] Phone: [phone]   Fax:  [phone]  Name: Erika Stanton MRN: [id number] Date of Birth: [birth year]

## 2021-07-13 ENCOUNTER — Other Ambulatory Visit: Payer: Self-pay

## 2021-07-13 ENCOUNTER — Ambulatory Visit: Payer: Medicare PPO | Attending: Physical Medicine & Rehabilitation | Admitting: Occupational Therapy

## 2021-07-13 DIAGNOSIS — I69159 Hemiplegia and hemiparesis following nontraumatic intracerebral hemorrhage affecting unspecified side: Secondary | ICD-10-CM | POA: Diagnosis present

## 2021-07-13 DIAGNOSIS — M6281 Muscle weakness (generalized): Secondary | ICD-10-CM | POA: Insufficient documentation

## 2021-07-13 DIAGNOSIS — M25511 Pain in right shoulder: Secondary | ICD-10-CM | POA: Diagnosis present

## 2021-07-13 DIAGNOSIS — R278 Other lack of coordination: Secondary | ICD-10-CM | POA: Diagnosis present

## 2021-07-13 DIAGNOSIS — M79641 Pain in right hand: Secondary | ICD-10-CM | POA: Diagnosis present

## 2021-07-13 DIAGNOSIS — R482 Apraxia: Secondary | ICD-10-CM | POA: Insufficient documentation

## 2021-07-14 NOTE — Therapy (Signed)
[place] MAIN [facility] SERVICES [address] [place], [place], [address] Phone: [phone]   Fax:  [phone]  Occupational Therapy Treatment  Patient Details  Name: Erika Stanton MRN: [id number] Date of Birth: 02-29-1956 No data recorded  Encounter Date: 07/13/2021   OT End of Session - 07/14/21 1322     Visit Number 124    Number of Visits 148    Date for OT Re-Evaluation 08/21/21    Authorization Time Period Medicare    OT Start Time 1301    OT Stop Time 1345    OT Time Calculation (min) 44 min    Activity Tolerance Patient tolerated treatment well    Behavior During Therapy [facility] for tasks assessed/performed             Past Medical History:  Diagnosis Date   Aneurysm ([place])    Melena     Past Surgical History:  Procedure Laterality Date   CHOLECYSTECTOMY     COLONOSCOPY  11/2017   at [facility]. no recurrent polyps.  suggest repeat surveillance study 11/2022.     COLONOSCOPY W/ POLYPECTOMY  06/2014   Dr Roxy Manns at [facility].  3 adenomatoous polyps, anal fissure.     ESOPHAGOGASTRODUODENOSCOPY (EGD) WITH PROPOFOL N/A 01/27/2020   Procedure: ESOPHAGOGASTRODUODENOSCOPY (EGD) WITH PROPOFOL;  Surgeon: Mauri Pole, MD;  Location: [facility];  Service: Endoscopy;  Laterality: N/A;   IR 3D INDEPENDENT WKST  01/06/2020   IR ANGIO INTRA EXTRACRAN SEL INTERNAL CAROTID BILAT MOD SED  01/06/2020   IR ANGIO VERTEBRAL SEL VERTEBRAL UNI L MOD SED  01/06/2020   IR ANGIOGRAM FOLLOW UP STUDY  01/06/2020   IR ANGIOGRAM FOLLOW UP STUDY  01/06/2020   IR ANGIOGRAM FOLLOW UP STUDY  01/06/2020   IR ANGIOGRAM FOLLOW UP STUDY  01/06/2020   IR ANGIOGRAM FOLLOW UP STUDY  01/06/2020   IR ANGIOGRAM FOLLOW UP STUDY  01/06/2020   IR ANGIOGRAM FOLLOW UP STUDY  01/06/2020   IR ANGIOGRAM FOLLOW UP STUDY  01/06/2020   IR ANGIOGRAM FOLLOW UP STUDY  01/06/2020   IR ANGIOGRAM FOLLOW UP STUDY  01/06/2020   IR NEURO EACH ADD'L AFTER BASIC UNI RIGHT (MS)  01/06/2020   IR  TRANSCATH/EMBOLIZ  01/06/2020   RADIOLOGY WITH ANESTHESIA N/A 01/06/2020   Procedure: IR WITH ANESTHESIA FOR ANEURYSM;  Surgeon: Consuella Lose, MD;  Location: [place];  Service: Radiology;  Laterality: N/A;   TUBAL LIGATION      There were no vitals filed for this visit.   Subjective Assessment - 07/14/21 1321     Subjective  Pt reports she and her husband are looking at a duplex near [facility] and trying to decide if they will move to another place.    Pertinent History ICH 01/06/20, Aneurysm. PMH: OA bilateral knees and hands    Patient Stated Goals Pt would like to be as independent as possible.    Currently in Pain? Yes    Pain Score 2     Pain Location Hand    Pain Orientation Right    Pain Descriptors / Indicators Aching;Tightness    Pain Type Chronic pain    Pain Onset More than a month ago    Pain Frequency Intermittent             Moist heat applied to right shoulder, hand and wrist for 5 mins prior to therapeutic exercise to decrease pain, increase motion and increase tissue mobility .   Therapeutic Exercises:  Performed slow, passive stretching for R IF and LF MP, PIP, and DIP flexion, and passive extension stretching for RF and SF at MP and PIP joints to tolerance.  Continued attempts at active digit blocking exercises for RF and LF DIP and PIP joints with only trace movement. Added flexion glove this date, pt requires max assist to don glove, adjusted rubber bands and applied prolonged stretch in increments during session.  Pt was instructed on use of flexion glove to use at home over the weekend and reassess next session.    Response to tx: Improved response this date with ROM exercises and use of flexion glove during session.  Pt does have pain in hand 2/10 or more with stretch at times.  She will require max assist from husband to don glove however, once it is on, she was instructed on applying incremental stretch with the rubber bands. She will try over the  weekend and she plans to bring in glove next session for reassessment of application, use and progress with ROM.  Continue to work towards goals in plan of care to improve RUE ROM, decrease pain and increase functional use.                      OT Education - 07/14/21 1322     Education Details use of flexion glove for right hand    Person(s) Educated Patient    Methods Explanation;Verbal cues;Demonstration    Comprehension Verbalized understanding;Returned demonstration;Tactile cues required;Verbal cues required;Need further instruction              OT Short Term Goals - 07/04/21 1355       OT SHORT TERM GOAL #1   Title I with initial HEP.    Time 6    Period Weeks    Status Achieved    Target Date 04/13/20      OT SHORT TERM GOAL #4   Title Pt will demonstrate 60* A/ROM shoulder flexion for RUE with pain less than or equal to 4/10.    Baseline Pt. presents with limited isolated  right shoulder flexion with pain continues to be 2. 08/08/20: 40* AROM with 3/10 pain; 03/07/21: Pt demos 75* active shoulder flexion with 2/10 pain or less. 40th visit: Pt. continues to present with limited shoulder ROM, and pain has decreased; 07/04/21 R shoulder active flexion 85, 3/10 pain.    Time 4    Period Weeks    Status On-going    Target Date 08/21/21      OT SHORT TERM GOAL #5   Title Pt will demonstrate improved LUE fine motor coordination for ADLs as evidenced by decreasing 9 hole peg test score to 25 secs or less.    Baseline 10/31/2020: Left FMC: 32 sec. Left FMC 41 sec visa the 9 hole peg test. 08/08/20: 32 seconds via 9 hole peg 40th visit: Left hand: 35 sec.; 07/04/21: L hand 28 sec    Time 12    Period Weeks    Status On-going    Target Date 08/21/21      OT SHORT TERM GOAL #7   Title Pt will demonstrate at least 25% finger flexion/ extension in RUE in prep for functional use.    Baseline Limited PROM, and active digit flexion, and extension. Improved AROM for  right 2nd, and 3rd digits. significant limitations with the 4th, and 5th digits. 08/08/20: AROM digit MCP 2nd (80*), 3rd (85*), 4th (80*), 5th (90*); 2/21/2: Since Botox injection  in Dec, R hand has had limited active movement; see note for details    Time 4    Period Weeks    Status On-going    Target Date 09/05/20               OT Long Term Goals - 07/05/21 1417       OT LONG TERM GOAL #3   Title Pt will use RUE as a stabilizer/ gross A at least 30 % of the time for ADLs/ IADLs.    Baseline Pt. is using her right hand as a gross stabilizer 15% of the time. 08/08/20: requires MAX cues; 03/07/21 Pt using R hand as a gross stabilizer at least 25% of the time. 40th visit: Pt. has difficulty using her right hand  as a gross stabilizer; 07/04/21: Pt uses RUE as a stabilizer 40-50% of the time for ADLs/IADLs.    Time 12    Period Weeks    Status On-going    Target Date 08/21/21      OT LONG TERM GOAL #4   Title Pt will increase LUE grip strength to 25 lbs or greater for increased ease with ADLs.    Baseline 12/13/20: 24# left, 1# right 10/31/2020: Pt. continues to present with limited left grip. Pt. has improved with left grip strength. Pt.continues to work towards 25#. 08/08/20: L grip 19#; 03/07/21: L grip 21 lbs (R 1 lb) 05/18/2021: Left grip strength: 30#; 07/04/21:L grip 21#, R 0#    Time 12    Period Weeks    Status On-going    Target Date 08/21/21      OT LONG TERM GOAL #5   Title 10/31/2020: Pt. is now perfroming laundry, loading the dishwasher, assists some with cooking, light meal preparation, setting the table    Baseline 12/13/2020:  continues to assist as needed but uses left hand .  10/13/20: Pt. assists with filling the dishwasher, and performing laundry. Pt. continues to try to help when she can, however her husband mostly performs these tasks. 08/08/20: Husband performs; 03/07/21: Pt assists with laundry, loading dishwasher, light cooking/meal prep; mostly using LUE. 05/18/2021: pt.  continues to assist with laundry skills, loading the dishwasher, light cooking using the left hand; 07/04/21: using L hand for above noted tasks    Time 12    Period Weeks    Status On-going    Target Date 08/21/21      OT LONG TERM GOAL #6   Title Pt will demonstrate ability to grasp/ release a cup 2/3 trials with RUE.    Baseline 12/13/20:  Continues to work on consistency with grasp and release 10/31/2020: Pt. continues to initiate grasp, and active release of objects, however is unable to grasp/release a cup. 08/08/20: Able to grasp, unable to release x1 trial; 03/05/21: Able to grasp/release cup with min A to keep cup from tipping over.05/18/2021: Pt. is unable to grasp items secondary to changes in the right hand following Botox injections. Pt. has 2nd,a nd 3rd digit PIP extensor tightness; 07/04/21: continues with extensor tightness in 2nd and 3rd digit, and flexor tightness in 4th and 5th digit MCPs    Time 12    Period Weeks    Status On-going    Target Date 08/21/21      OT LONG TERM GOAL #7   Title Pt will demonstrate the ability to place clothing on hangers with use of bilateral UE and modified technique.    Baseline Eval: unable, 03/07/21: min-mod A;  07/04/21: predominantly manages wtih LUE    Time 12    Period Weeks    Status On-going    Target Date 08/21/21      OT LONG TERM GOAL #8   Title I with updated HEP.    Baseline 12/13/2020: Continual changes to HEP 09/12/2020: Pt. continues to require assist for her HEP. Pt. requires assist, and cuing for HEP. 08/08/20: reports completing with husband assist for technique; 03/07/21: min vc. 05/18/2021: Pt. requires assist with HEPs, Husband assists with HEP; 07/04/21: vc to increase frequency with stretching sessions for R hand; needing several times per day, not just once    Time 12    Period Weeks    Status Partially Met    Target Date 08/21/21      OT LONG TERM GOAL  #10   TITLE Pt will demonstrate the ability to perform zippers with  modified independence and use of adaptive equipment as needed.    Baseline difficulty; 03/07/21: cues to modify zippers with string loops (has not yet tried) 05/18/2021: TBD; 07/04/21: assist needed with zippers    Time 12    Period Weeks    Status On-going    Target Date 08/21/21                   Plan - 07/14/21 1323     Clinical Impression Statement Improved response this date with ROM exercises and use of flexion glove during session.  Pt does have pain in hand 2/10 or more with stretch at times.  She will require max assist from husband to don glove however, once it is on, she was instructed on applying incremental stretch with the rubber bands. She will try over the weekend and she plans to bring in glove next session for reassessment of application, use and progress with ROM.  Continue to work towards goals in plan of care to improve RUE ROM, decrease pain and increase functional use.    OT Occupational Profile and History Detailed Assessment- Review of Records and additional review of physical, cognitive, psychosocial history related to current functional performance    Occupational performance deficits (Please refer to evaluation for details): ADL's;IADL's;Rest and Sleep;Work;Play;Leisure    Body Structure / Function / Physical Skills ADL;Balance;Coordination;Decreased knowledge of precautions;Decreased knowledge of use of DME;Dexterity;Edema;Mobility;Tone;Strength;IADL;Sensation;GMC;Gait;ROM;FMC;Flexibility;Pain;Vision;UE functional use;Endurance    Cognitive Skills Attention;Perception;Problem Solve;Safety Awareness;Thought    Rehab Potential Good    Clinical Decision Making Several treatment options, min-mod task modification necessary    Comorbidities Affecting Occupational Performance: None    Modification or Assistance to Complete Evaluation  Min-Moderate modification of tasks or assist with assess necessary to complete eval    OT Frequency 2x / week    OT Duration 12 weeks     OT Treatment/Interventions Self-care/ADL training;Ultrasound;Energy conservation;Visual/perceptual remediation/compensation;Patient/family education;DME and/or AE instruction;Aquatic Therapy;Paraffin;Gait Training;Passive range of motion;Balance training;Fluidtherapy;Electrical Stimulation;Functional Mobility Training;Splinting;Moist Heat;Therapeutic exercise;Manual Therapy;Cognitive remediation/compensation;Manual lymph drainage;Neuromuscular education;Coping strategies training    Plan Pt is transferring her care to [facility] as it is closer to home. She remains very fearful of moving RUE and responds well to heat, and estim.    Consulted and Agree with Plan of Care Patient             Patient will benefit from skilled therapeutic intervention in order to improve the following deficits and impairments:   Body Structure / Function / Physical Skills: ADL, Balance, Coordination, Decreased knowledge of precautions, Decreased knowledge of use of DME, Dexterity, Edema, Mobility, Tone, Strength,  IADL, Sensation, GMC, Gait, ROM, FMC, Flexibility, Pain, Vision, UE functional use, Endurance Cognitive Skills: Attention, Perception, Problem Solve, Safety Awareness, Thought     Visit Diagnosis: Muscle weakness (generalized)  Other lack of coordination  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, unspecified laterality (HCC)  Acute pain of right shoulder  Pain in right hand    Problem List Patient Active Problem List   Diagnosis Date Noted   Dyspareunia in female 04/05/2020   History of abnormal cervical Pap smear 03/10/2020   GIB (gastrointestinal bleeding) 02/28/2020   Elevated BUN    Prediabetes    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   Cerebral vasospasm    Sinus tachycardia    Dysphagia, post-stroke    Thrombocytopenia (HCC)    Acute blood loss anemia    Brain aneurysm    ICH (intracerebral hemorrhage) ([place]) 01/05/2020   History of adenomatous  polyp of colon 02/07/2016   Anatomical narrow angle, 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

## 2021-07-18 ENCOUNTER — Ambulatory Visit: Payer: Medicare PPO

## 2021-07-18 ENCOUNTER — Other Ambulatory Visit: Payer: Self-pay

## 2021-07-18 DIAGNOSIS — M79641 Pain in right hand: Secondary | ICD-10-CM

## 2021-07-18 DIAGNOSIS — M6281 Muscle weakness (generalized): Secondary | ICD-10-CM | POA: Diagnosis not present

## 2021-07-18 DIAGNOSIS — R278 Other lack of coordination: Secondary | ICD-10-CM

## 2021-07-18 NOTE — Therapy (Signed)
[place] [facility] [address] [place], [place], [address] Phone: [phone]   Fax:  [phone]  Occupational Therapy Treatment  Patient Details  Name: Erika Stanton MRN: [id number] Date of Birth: December 23, 1955 No data recorded  Encounter Date: 07/18/2021   OT End of Session - 07/18/21 1725     Visit Number 124    Number of Visits 148    Date for OT Re-Evaluation 08/21/21    Authorization Type Progress report period starting 07/06/21    Authorization Time Period Medicare    OT Start Time 1300    OT Stop Time 1345    OT Time Calculation (min) 45 min    Activity Tolerance Patient tolerated treatment well    Behavior During Therapy [facility] for tasks assessed/performed             Past Medical History:  Diagnosis Date   Aneurysm ([place])    Melena     Past Surgical History:  Procedure Laterality Date   CHOLECYSTECTOMY     COLONOSCOPY  11/2017   at [facility]. no recurrent polyps.  suggest repeat surveillance study 11/2022.     COLONOSCOPY W/ POLYPECTOMY  06/2014   Dr Roxy Manns at [facility].  3 adenomatoous polyps, anal fissure.     ESOPHAGOGASTRODUODENOSCOPY (EGD) WITH PROPOFOL N/A 01/27/2020   Procedure: ESOPHAGOGASTRODUODENOSCOPY (EGD) WITH PROPOFOL;  Surgeon: Mauri Pole, MD;  Location: [facility];  Service: Endoscopy;  Laterality: N/A;   IR 3D INDEPENDENT WKST  01/06/2020   IR ANGIO INTRA EXTRACRAN SEL INTERNAL CAROTID BILAT MOD SED  01/06/2020   IR ANGIO VERTEBRAL SEL VERTEBRAL UNI L MOD SED  01/06/2020   IR ANGIOGRAM FOLLOW UP STUDY  01/06/2020   IR ANGIOGRAM FOLLOW UP STUDY  01/06/2020   IR ANGIOGRAM FOLLOW UP STUDY  01/06/2020   IR ANGIOGRAM FOLLOW UP STUDY  01/06/2020   IR ANGIOGRAM FOLLOW UP STUDY  01/06/2020   IR ANGIOGRAM FOLLOW UP STUDY  01/06/2020   IR ANGIOGRAM FOLLOW UP STUDY  01/06/2020   IR ANGIOGRAM FOLLOW UP STUDY  01/06/2020   IR ANGIOGRAM FOLLOW UP STUDY  01/06/2020   IR ANGIOGRAM FOLLOW UP STUDY  01/06/2020   IR  NEURO EACH ADD'L AFTER BASIC UNI RIGHT (MS)  01/06/2020   IR TRANSCATH/EMBOLIZ  01/06/2020   RADIOLOGY WITH ANESTHESIA N/A 01/06/2020   Procedure: IR WITH ANESTHESIA FOR ANEURYSM;  Surgeon: Consuella Lose, MD;  Location: [place];  Service: Radiology;  Laterality: N/A;   TUBAL LIGATION      There were no vitals filed for this visit.   Subjective Assessment - 07/18/21 1722     Subjective  Pt reports her husband had a hard time getting on her flexion glove, but he managed.    Patient is accompanied by: Family member    Pertinent History ICH 01/06/20, Aneurysm. PMH: OA bilateral knees and hands    Repetition Decreases Symptoms    Patient Stated Goals Pt would like to be as independent as possible.    Currently in Pain? Yes    Pain Score 2     Pain Location Hand    Pain Orientation Right    Pain Descriptors / Indicators Aching;Tightness;Sore    Pain Type Chronic pain    Pain Radiating Towards shoulder, hand    Pain Onset More than a month ago    Pain Frequency Intermittent    Aggravating Factors  with attempts at finger flexion    Pain Relieving Factors rest/heat/gentle  stretch    Effect of Pain on Daily Activities decreased activity tolerance when using RUE    Multiple Pain Sites No    Pain Onset More than a month ago            Occupational Therapy Treatment: Moist heat applied to R shoulder and hand x 5 min for pain reduction/muscle relaxation in prep for therapeutic exercise and donning flexion glove.  Therapeutic Exercise: Passive stretching performed for R shoulder flex/abd and prolonged stretch for R shoulder ER, active assisted R shoulder flex/abd x10 each, working to increase R shoulder mobility for UB ADLs.  Performed slow, prolonged stretch for R hand IF and LF MCP, PIP, DIP flexion, and MCP and PIP ext of digits 2-5 with good tolerance.  Performed active assisted joint blocking for R IF PIP and DIP for increasing flexion.      Orthotic fit training:  Following  therapeutic exercise, OT donned flexion glove, providing instruction for easier donning techniques d/t pt reported spouse has some difficulty.  Pt able to verbalize donning strategies following education and will relay to spouse.  OT made adjustments to rubber band tension to increase flexion tolerance for R hand IF and LF, and was able to adjust again after 5 min of wearing glove to further increase flexion.  OT educated pt on having spouse adjust tension of rubber bands as needed to increase or decrease flexion in digits by knotting rubber bands; pt verbalized understanding.  Pt tolerated 10 min without discomfort.  Encouraged pt to slowly increase wearing time as able, working up to 15-20 min on, several times per day.    Response to Treatment: See Plan/clinical impression below.    OT Education - 07/18/21 1724     Education Details use of flexion glove for right hand, donning strategy for glove, increasing wearing time as tolerated    Person(s) Educated Patient    Methods Explanation;Verbal cues;Demonstration;Tactile cues    Comprehension Verbalized understanding;Returned demonstration;Tactile cues required;Verbal cues required;Need further instruction              OT Short Term Goals - 07/04/21 1355       OT SHORT TERM GOAL #1   Title I with initial HEP.    Time 6    Period Weeks    Status Achieved    Target Date 04/13/20      OT SHORT TERM GOAL #4   Title Pt will demonstrate 60* A/ROM shoulder flexion for RUE with pain less than or equal to 4/10.    Baseline Pt. presents with limited isolated  right shoulder flexion with pain continues to be 2. 08/08/20: 40* AROM with 3/10 pain; 03/07/21: Pt demos 75* active shoulder flexion with 2/10 pain or less. 40th visit: Pt. continues to present with limited shoulder ROM, and pain has decreased; 07/04/21 R shoulder active flexion 85, 3/10 pain.    Time 4    Period Weeks    Status On-going    Target Date 08/21/21      OT SHORT TERM GOAL  #5   Title Pt will demonstrate improved LUE fine motor coordination for ADLs as evidenced by decreasing 9 hole peg test score to 25 secs or less.    Baseline 10/31/2020: Left FMC: 32 sec. Left FMC 41 sec visa the 9 hole peg test. 08/08/20: 32 seconds via 9 hole peg 40th visit: Left hand: 35 sec.; 07/04/21: L hand 28 sec    Time 12    Period Weeks  Status On-going    Target Date 08/21/21      OT SHORT TERM GOAL #7   Title Pt will demonstrate at least 25% finger flexion/ extension in RUE in prep for functional use.    Baseline Limited PROM, and active digit flexion, and extension. Improved AROM for right 2nd, and 3rd digits. significant limitations with the 4th, and 5th digits. 08/08/20: AROM digit MCP 2nd (80*), 3rd (85*), 4th (80*), 5th (90*); 2/21/2: Since Botox injection in Dec, R hand has had limited active movement; see note for details    Time 4    Period Weeks    Status On-going    Target Date 09/05/20               OT Long Term Goals - 07/05/21 1417       OT LONG TERM GOAL #3   Title Pt will use RUE as a stabilizer/ gross A at least 30 % of the time for ADLs/ IADLs.    Baseline Pt. is using her right hand as a gross stabilizer 15% of the time. 08/08/20: requires MAX cues; 03/07/21 Pt using R hand as a gross stabilizer at least 25% of the time. 40th visit: Pt. has difficulty using her right hand  as a gross stabilizer; 07/04/21: Pt uses RUE as a stabilizer 40-50% of the time for ADLs/IADLs.    Time 12    Period Weeks    Status On-going    Target Date 08/21/21      OT LONG TERM GOAL #4   Title Pt will increase LUE grip strength to 25 lbs or greater for increased ease with ADLs.    Baseline 12/13/20: 24# left, 1# right 10/31/2020: Pt. continues to present with limited left grip. Pt. has improved with left grip strength. Pt.continues to work towards 25#. 08/08/20: L grip 19#; 03/07/21: L grip 21 lbs (R 1 lb) 05/18/2021: Left grip strength: 30#; 07/04/21:L grip 21#, R 0#    Time 12     Period Weeks    Status On-going    Target Date 08/21/21      OT LONG TERM GOAL #5   Title 10/31/2020: Pt. is now perfroming laundry, loading the dishwasher, assists some with cooking, light meal preparation, setting the table    Baseline 12/13/2020:  continues to assist as needed but uses left hand .  10/13/20: Pt. assists with filling the dishwasher, and performing laundry. Pt. continues to try to help when she can, however her husband mostly performs these tasks. 08/08/20: Husband performs; 03/07/21: Pt assists with laundry, loading dishwasher, light cooking/meal prep; mostly using LUE. 05/18/2021: pt. continues to assist with laundry skills, loading the dishwasher, light cooking using the left hand; 07/04/21: using L hand for above noted tasks    Time 12    Period Weeks    Status On-going    Target Date 08/21/21      OT LONG TERM GOAL #6   Title Pt will demonstrate ability to grasp/ release a cup 2/3 trials with RUE.    Baseline 12/13/20:  Continues to work on consistency with grasp and release 10/31/2020: Pt. continues to initiate grasp, and active release of objects, however is unable to grasp/release a cup. 08/08/20: Able to grasp, unable to release x1 trial; 03/05/21: Able to grasp/release cup with min A to keep cup from tipping over.05/18/2021: Pt. is unable to grasp items secondary to changes in the right hand following Botox injections. Pt. has 2nd,a nd 3rd digit PIP extensor  tightness; 07/04/21: continues with extensor tightness in 2nd and 3rd digit, and flexor tightness in 4th and 5th digit MCPs    Time 12    Period Weeks    Status On-going    Target Date 08/21/21      OT LONG TERM GOAL #7   Title Pt will demonstrate the ability to place clothing on hangers with use of bilateral UE and modified technique.    Baseline Eval: unable, 03/07/21: min-mod A; 07/04/21: predominantly manages wtih LUE    Time 12    Period Weeks    Status On-going    Target Date 08/21/21      OT LONG TERM GOAL #8    Title I with updated HEP.    Baseline 12/13/2020: Continual changes to HEP 09/12/2020: Pt. continues to require assist for her HEP. Pt. requires assist, and cuing for HEP. 08/08/20: reports completing with husband assist for technique; 03/07/21: min vc. 05/18/2021: Pt. requires assist with HEPs, Husband assists with HEP; 07/04/21: vc to increase frequency with stretching sessions for R hand; needing several times per day, not just once    Time 12    Period Weeks    Status Partially Met    Target Date 08/21/21      OT LONG TERM GOAL  #10   TITLE Pt will demonstrate the ability to perform zippers with modified independence and use of adaptive equipment as needed.    Baseline difficulty; 03/07/21: cues to modify zippers with string loops (has not yet tried) 05/18/2021: TBD; 07/04/21: assist needed with zippers    Time 12    Period Weeks    Status On-going    Target Date 08/21/21              Plan - 07/18/21 0819     Clinical Impression Statement Good tolerance to passive and AAROM this day throughout RUE.  Following therapeutic exercise, OT donned flexion glove, providing instruction for easier donning techniques d/t pt reported spouse has some difficulty.  Pt able to verbalize donning strategies following education and will relay to spouse.  OT made adjustments to rubber band tension to increase flexion tolerance for R hand IF and LF, and was able to adjust again after 5 min of wearing glove to further increase flexion.  OT educated pt on having spouse adjust tension of rubber bands as needed to increase or decrease flexion in digits by knotting rubber bands; pt verbalized understanding.  Pt tolerated 10 min without discomfort.  Encouraged pt to slowly increase wearing time as able, working up to 15-20 min on, several times per day.  Pt will continue to benefit from skilled OT for managing R hand stiffness with therapeutic exercise and flexion glove, managing R shoulder pain, working to increase  strength and flexibility throughout RUE, and increasing functional use of RUE for daily tasks.    OT Occupational Profile and History Detailed Assessment- Review of Records and additional review of physical, cognitive, psychosocial history related to current functional performance    Occupational performance deficits (Please refer to evaluation for details): ADL's;IADL's;Rest and Sleep;Work;Play;Leisure    Body Structure / Function / Physical Skills ADL;Balance;Coordination;Decreased knowledge of precautions;Decreased knowledge of use of DME;Dexterity;Edema;Mobility;Tone;Strength;IADL;Sensation;GMC;Gait;ROM;FMC;Flexibility;Pain;Vision;UE functional use;Endurance    Cognitive Skills Attention;Perception;Problem Solve;Safety Awareness;Thought    Rehab Potential Good    Clinical Decision Making Several treatment options, min-mod task modification necessary    Comorbidities Affecting Occupational Performance: None    Modification or Assistance to Complete Evaluation  Min-Moderate modification of tasks  or assist with assess necessary to complete eval    OT Frequency 2x / week    OT Duration 12 weeks    OT Treatment/Interventions Self-care/ADL training;Ultrasound;Energy conservation;Visual/perceptual remediation/compensation;Patient/family education;DME and/or AE instruction;Aquatic Therapy;Paraffin;Gait Training;Passive range of motion;Balance training;Fluidtherapy;Electrical Stimulation;Functional Mobility Training;Splinting;Moist Heat;Therapeutic exercise;Manual Therapy;Cognitive remediation/compensation;Manual lymph drainage;Neuromuscular education;Coping strategies training    Plan Pt is transferring her care to [facility] as it is closer to home. She remains very fearful of moving RUE and responds well to heat, and estim.    Consulted and Agree with Plan of Care Patient             Patient will benefit from skilled therapeutic intervention in order to improve the following deficits and impairments:    Body Structure / Function / Physical Skills: ADL, Balance, Coordination, Decreased knowledge of precautions, Decreased knowledge of use of DME, Dexterity, Edema, Mobility, Tone, Strength, IADL, Sensation, GMC, Gait, ROM, FMC, Flexibility, Pain, Vision, UE functional use, Endurance Cognitive Skills: Attention, Perception, Problem Solve, Safety Awareness, Thought     Visit Diagnosis: Muscle weakness (generalized)  Other lack of coordination  Pain in right hand    Problem List Patient Active Problem List   Diagnosis Date Noted   Dyspareunia in female 04/05/2020   History of abnormal cervical Pap smear 03/10/2020   GIB (gastrointestinal bleeding) 02/28/2020   Elevated BUN    Prediabetes    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   Cerebral vasospasm    Sinus tachycardia    Dysphagia, post-stroke    Thrombocytopenia ([facility])    Acute blood loss anemia    Brain aneurysm    ICH (intracerebral hemorrhage) ([place]) 01/05/2020   History of adenomatous polyp of colon 02/07/2016   Anatomical narrow angle, 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

## 2021-07-20 ENCOUNTER — Ambulatory Visit: Payer: Medicare PPO

## 2021-07-20 ENCOUNTER — Other Ambulatory Visit: Payer: Self-pay

## 2021-07-20 DIAGNOSIS — R482 Apraxia: Secondary | ICD-10-CM

## 2021-07-20 DIAGNOSIS — M6281 Muscle weakness (generalized): Secondary | ICD-10-CM

## 2021-07-20 DIAGNOSIS — R278 Other lack of coordination: Secondary | ICD-10-CM

## 2021-07-21 NOTE — Therapy (Signed)
[place] MAIN [facility] SERVICES [address] [place], [place], [address] Phone: [phone]   Fax:  [phone]  Occupational Therapy Treatment  Patient Details  Name: Erika Stanton MRN: [id number] Date of Birth: December 20, 1955 No data recorded  Encounter Date: 07/20/2021   OT End of Session - 07/21/21 0803     Visit Number 125    Number of Visits 148    Date for OT Re-Evaluation 08/21/21    Authorization Type Progress report period starting 07/06/21    Authorization Time Period Medicare    OT Start Time 1300    OT Stop Time 1345    OT Time Calculation (min) 45 min    Activity Tolerance Patient tolerated treatment well    Behavior During Therapy [facility] for tasks assessed/performed             Past Medical History:  Diagnosis Date   Aneurysm ([place])    Melena     Past Surgical History:  Procedure Laterality Date   CHOLECYSTECTOMY     COLONOSCOPY  11/2017   at [facility]. no recurrent polyps.  suggest repeat surveillance study 11/2022.     COLONOSCOPY W/ POLYPECTOMY  06/2014   Dr Roxy Manns at [facility].  3 adenomatoous polyps, anal fissure.     ESOPHAGOGASTRODUODENOSCOPY (EGD) WITH PROPOFOL N/A 01/27/2020   Procedure: ESOPHAGOGASTRODUODENOSCOPY (EGD) WITH PROPOFOL;  Surgeon: Mauri Pole, MD;  Location: [place] ENDOSCOPY;  Service: Endoscopy;  Laterality: N/A;   IR 3D INDEPENDENT WKST  01/06/2020   IR ANGIO INTRA EXTRACRAN SEL INTERNAL CAROTID BILAT MOD SED  01/06/2020   IR ANGIO VERTEBRAL SEL VERTEBRAL UNI L MOD SED  01/06/2020   IR ANGIOGRAM FOLLOW UP STUDY  01/06/2020   IR ANGIOGRAM FOLLOW UP STUDY  01/06/2020   IR ANGIOGRAM FOLLOW UP STUDY  01/06/2020   IR ANGIOGRAM FOLLOW UP STUDY  01/06/2020   IR ANGIOGRAM FOLLOW UP STUDY  01/06/2020   IR ANGIOGRAM FOLLOW UP STUDY  01/06/2020   IR ANGIOGRAM FOLLOW UP STUDY  01/06/2020   IR ANGIOGRAM FOLLOW UP STUDY  01/06/2020   IR ANGIOGRAM FOLLOW UP STUDY  01/06/2020   IR ANGIOGRAM FOLLOW UP STUDY  01/06/2020   IR  NEURO EACH ADD'L AFTER BASIC UNI RIGHT (MS)  01/06/2020   IR TRANSCATH/EMBOLIZ  01/06/2020   RADIOLOGY WITH ANESTHESIA N/A 01/06/2020   Procedure: IR WITH ANESTHESIA FOR ANEURYSM;  Surgeon: Consuella Lose, MD;  Location: [place];  Service: Radiology;  Laterality: N/A;   TUBAL LIGATION      There were no vitals filed for this visit.   Subjective Assessment - 07/20/21 0801     Subjective  Pt reports doing well today.    Patient is accompanied by: Family member    Pertinent History ICH 01/06/20, Aneurysm. PMH: OA bilateral knees and hands    Repetition Decreases Symptoms    Patient Stated Goals Pt would like to be as independent as possible.    Currently in Pain? Yes    Pain Score 2     Pain Location Hand    Pain Orientation Right    Pain Descriptors / Indicators Aching;Sore;Tightness    Pain Type Chronic pain    Pain Radiating Towards shoulder, hand    Pain Onset More than a month ago    Pain Frequency Intermittent    Aggravating Factors  with attempts at finger flexion    Pain Relieving Factors rest/heat/gentle stretch    Effect of Pain on Daily Activities decreased  activity tolerance when using RUE    Multiple Pain Sites No    Pain Onset More than a month ago            Occupational Therapy Treatment: Moist heat applied to R shoulder and hand x 5 min for pain reduction/muscle relaxation in prep for manual therapy and therapeutic exercise.  Manual Therapy: Performed slow, prolonged stretch for R hand IF and LF MCP, PIP, DIP flexion, and MCP and PIP ext of digits 2-5 with good tolerance, performed in prep for donning flexion glove.   Therapeutic Exercise: Donned flexion glove and tolerated x10 min with intermittent adjustment of band tension for R IF (had to loosen slightly to keep stretch within a tolerable range.)  With glove donned, pt participated in therapeutic exercise to facilitate active R shoulder flex, abd, and horiz abd/add reaching with RUE to push rings on/off  [place] tower between levels 1-3.  Pt performs with rest breaks between each level and moderate vc to minimize compensatory leaning while reaching with RUE.  Response to Treatment: See Plan/clinical impression below.     OT Education - 07/20/21 0803     Education Details use of flexion glove, slowly increasing wearing time    Person(s) Educated Patient    Methods Explanation;Verbal cues;Demonstration;Tactile cues    Comprehension Verbalized understanding;Returned demonstration;Tactile cues required;Verbal cues required;Need further instruction              OT Short Term Goals - 07/04/21 1355       OT SHORT TERM GOAL #1   Title I with initial HEP.    Time 6    Period Weeks    Status Achieved    Target Date 04/13/20      OT SHORT TERM GOAL #4   Title Pt will demonstrate 60* A/ROM shoulder flexion for RUE with pain less than or equal to 4/10.    Baseline Pt. presents with limited isolated  right shoulder flexion with pain continues to be 2. 08/08/20: 40* AROM with 3/10 pain; 03/07/21: Pt demos 75* active shoulder flexion with 2/10 pain or less. 40th visit: Pt. continues to present with limited shoulder ROM, and pain has decreased; 07/04/21 R shoulder active flexion 85, 3/10 pain.    Time 4    Period Weeks    Status On-going    Target Date 08/21/21      OT SHORT TERM GOAL #5   Title Pt will demonstrate improved LUE fine motor coordination for ADLs as evidenced by decreasing 9 hole peg test score to 25 secs or less.    Baseline 10/31/2020: Left FMC: 32 sec. Left FMC 41 sec visa the 9 hole peg test. 08/08/20: 32 seconds via 9 hole peg 40th visit: Left hand: 35 sec.; 07/04/21: L hand 28 sec    Time 12    Period Weeks    Status On-going    Target Date 08/21/21      OT SHORT TERM GOAL #7   Title Pt will demonstrate at least 25% finger flexion/ extension in RUE in prep for functional use.    Baseline Limited PROM, and active digit flexion, and extension. Improved AROM for right 2nd,  and 3rd digits. significant limitations with the 4th, and 5th digits. 08/08/20: AROM digit MCP 2nd (80*), 3rd (85*), 4th (80*), 5th (90*); 2/21/2: Since Botox injection in Dec, R hand has had limited active movement; see note for details    Time 4    Period Weeks    Status On-going  Target Date 09/05/20               OT Long Term Goals - 07/05/21 1417       OT LONG TERM GOAL #3   Title Pt will use RUE as a stabilizer/ gross A at least 30 % of the time for ADLs/ IADLs.    Baseline Pt. is using her right hand as a gross stabilizer 15% of the time. 08/08/20: requires MAX cues; 03/07/21 Pt using R hand as a gross stabilizer at least 25% of the time. 40th visit: Pt. has difficulty using her right hand  as a gross stabilizer; 07/04/21: Pt uses RUE as a stabilizer 40-50% of the time for ADLs/IADLs.    Time 12    Period Weeks    Status On-going    Target Date 08/21/21      OT LONG TERM GOAL #4   Title Pt will increase LUE grip strength to 25 lbs or greater for increased ease with ADLs.    Baseline 12/13/20: 24# left, 1# right 10/31/2020: Pt. continues to present with limited left grip. Pt. has improved with left grip strength. Pt.continues to work towards 25#. 08/08/20: L grip 19#; 03/07/21: L grip 21 lbs (R 1 lb) 05/18/2021: Left grip strength: 30#; 07/04/21:L grip 21#, R 0#    Time 12    Period Weeks    Status On-going    Target Date 08/21/21      OT LONG TERM GOAL #5   Title 10/31/2020: Pt. is now perfroming laundry, loading the dishwasher, assists some with cooking, light meal preparation, setting the table    Baseline 12/13/2020:  continues to assist as needed but uses left hand .  10/13/20: Pt. assists with filling the dishwasher, and performing laundry. Pt. continues to try to help when she can, however her husband mostly performs these tasks. 08/08/20: Husband performs; 03/07/21: Pt assists with laundry, loading dishwasher, light cooking/meal prep; mostly using LUE. 05/18/2021: pt. continues to  assist with laundry skills, loading the dishwasher, light cooking using the left hand; 07/04/21: using L hand for above noted tasks    Time 12    Period Weeks    Status On-going    Target Date 08/21/21      OT LONG TERM GOAL #6   Title Pt will demonstrate ability to grasp/ release a cup 2/3 trials with RUE.    Baseline 12/13/20:  Continues to work on consistency with grasp and release 10/31/2020: Pt. continues to initiate grasp, and active release of objects, however is unable to grasp/release a cup. 08/08/20: Able to grasp, unable to release x1 trial; 03/05/21: Able to grasp/release cup with min A to keep cup from tipping over.05/18/2021: Pt. is unable to grasp items secondary to changes in the right hand following Botox injections. Pt. has 2nd,a nd 3rd digit PIP extensor tightness; 07/04/21: continues with extensor tightness in 2nd and 3rd digit, and flexor tightness in 4th and 5th digit MCPs    Time 12    Period Weeks    Status On-going    Target Date 08/21/21      OT LONG TERM GOAL #7   Title Pt will demonstrate the ability to place clothing on hangers with use of bilateral UE and modified technique.    Baseline Eval: unable, 03/07/21: min-mod A; 07/04/21: predominantly manages wtih LUE    Time 12    Period Weeks    Status On-going    Target Date 08/21/21  OT LONG TERM GOAL #8   Title I with updated HEP.    Baseline 12/13/2020: Continual changes to HEP 09/12/2020: Pt. continues to require assist for her HEP. Pt. requires assist, and cuing for HEP. 08/08/20: reports completing with husband assist for technique; 03/07/21: min vc. 05/18/2021: Pt. requires assist with HEPs, Husband assists with HEP; 07/04/21: vc to increase frequency with stretching sessions for R hand; needing several times per day, not just once    Time 12    Period Weeks    Status Partially Met    Target Date 08/21/21      OT LONG TERM GOAL  #10   TITLE Pt will demonstrate the ability to perform zippers with modified  independence and use of adaptive equipment as needed.    Baseline difficulty; 03/07/21: cues to modify zippers with string loops (has not yet tried) 05/18/2021: TBD; 07/04/21: assist needed with zippers    Time 12    Period Weeks    Status On-going    Target Date 08/21/21             Plan - 07/20/21 0814     Clinical Impression Statement Pt reports she didn't get much wearing time with flexion glove since last visit, but understands need for frequent donning with short duration, gradually increasing wearing time as tolerated.  Pt will continue to benefit from skilled OT to address R hand stiffness with therapeutic exercise and flexion glove, managing R shoulder pain, working to increase strength and flexibility throughout RUE, and increasing functional use of RUE for daily tasks.    OT Occupational Profile and History Detailed Assessment- Review of Records and additional review of physical, cognitive, psychosocial history related to current functional performance    Occupational performance deficits (Please refer to evaluation for details): ADL's;IADL's;Rest and Sleep;Work;Play;Leisure    Body Structure / Function / Physical Skills ADL;Balance;Coordination;Decreased knowledge of precautions;Decreased knowledge of use of DME;Dexterity;Edema;Mobility;Tone;Strength;IADL;Sensation;GMC;Gait;ROM;FMC;Flexibility;Pain;Vision;UE functional use;Endurance    Cognitive Skills Attention;Perception;Problem Solve;Safety Awareness;Thought    Rehab Potential Good    Clinical Decision Making Several treatment options, min-mod task modification necessary    Comorbidities Affecting Occupational Performance: None    Modification or Assistance to Complete Evaluation  Min-Moderate modification of tasks or assist with assess necessary to complete eval    OT Frequency 2x / week    OT Duration 12 weeks    OT Treatment/Interventions Self-care/ADL training;Ultrasound;Energy conservation;Visual/perceptual  remediation/compensation;Patient/family education;DME and/or AE instruction;Aquatic Therapy;Paraffin;Gait Training;Passive range of motion;Balance training;Fluidtherapy;Electrical Stimulation;Functional Mobility Training;Splinting;Moist Heat;Therapeutic exercise;Manual Therapy;Cognitive remediation/compensation;Manual lymph drainage;Neuromuscular education;Coping strategies training    Plan Pt is transferring her care to [facility] OP as it is closer to home. She remains very fearful of moving RUE and responds well to heat, and estim.    Consulted and Agree with Plan of Care Patient             Patient will benefit from skilled therapeutic intervention in order to improve the following deficits and impairments:   Body Structure / Function / Physical Skills: ADL, Balance, Coordination, Decreased knowledge of precautions, Decreased knowledge of use of DME, Dexterity, Edema, Mobility, Tone, Strength, IADL, Sensation, GMC, Gait, ROM, FMC, Flexibility, Pain, Vision, UE functional use, Endurance Cognitive Skills: Attention, Perception, Problem Solve, Safety Awareness, Thought     Visit Diagnosis: Apraxia  Muscle weakness (generalized)  Other lack of coordination    Problem List Patient Active Problem List   Diagnosis Date Noted   Dyspareunia in female 04/05/2020   History of abnormal cervical Pap smear  03/10/2020   GIB (gastrointestinal bleeding) 02/28/2020   Elevated BUN    Prediabetes    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   Cerebral vasospasm    Sinus tachycardia    Dysphagia, post-stroke    Thrombocytopenia ([facility])    Acute blood loss anemia    Brain aneurysm    ICH (intracerebral hemorrhage) ([address]) 01/05/2020   History of adenomatous polyp of colon 02/07/2016   Anatomical narrow angle, bilateral 12/14/2014   Fissure in ano 11/11/2014   Hemorrhoids 11/11/2014   Constipation 11/19/2013   GERD (gastroesophageal reflux disease) 03/24/2013   Irritable bowel syndrome with  diarrhea 03/24/2013   Herpes zoster 05/14/2005   Abnormal Pap smear of cervix 05/15/1983   History of cervical dysplasia 05/15/1983   Leta Speller, MS, OTR/L  Darleene Cleaver, OT 07/21/2021, 8:15 AM  [place] MAIN [facility] SERVICES [address] [place], [place], [address] Phone: [phone]   Fax:  [phone]  Name: Erika Stanton MRN: [id number] Date of Birth: 12/12/1955

## 2021-07-23 ENCOUNTER — Encounter: Payer: Self-pay | Admitting: Physical Medicine and Rehabilitation

## 2021-07-25 ENCOUNTER — Ambulatory Visit: Payer: Medicare PPO

## 2021-07-25 ENCOUNTER — Other Ambulatory Visit: Payer: Self-pay

## 2021-07-25 DIAGNOSIS — M6281 Muscle weakness (generalized): Secondary | ICD-10-CM

## 2021-07-25 DIAGNOSIS — R278 Other lack of coordination: Secondary | ICD-10-CM

## 2021-07-25 DIAGNOSIS — R482 Apraxia: Secondary | ICD-10-CM

## 2021-07-26 NOTE — Therapy (Signed)
[place] ?[place] [facility] ?[place], [place], [address] ?Phone: [phone]   Fax:  [phone] ? ?Occupational Therapy Treatment ? ?Patient Details  ?Name: Erika Stanton ?MRN: [id number] ?Date of Birth: 1956/04/16 ?No data recorded ? ?Encounter Date: 07/25/2021 ? ? OT End of Session - 07/26/21 1236   ? ? Visit Number 126   ? Number of Visits 148   ? Date for OT Re-Evaluation 08/21/21   ? Authorization Type Progress report period starting 07/06/21   ? Authorization Time Period Medicare   ? OT Start Time 1300   ? OT Stop Time 1345   ? OT Time Calculation (min) 45 min   ? Activity Tolerance Patient tolerated treatment well   ? Behavior During Therapy [facility] for tasks assessed/performed   ? ?  ?  ? ?  ? ? ?Past Medical History:  ?Diagnosis Date  ? Aneurysm ([place])   ? Melena   ? ? ?Past Surgical History:  ?Procedure Laterality Date  ? CHOLECYSTECTOMY    ? COLONOSCOPY  11/2017  ? at [facility]. no recurrent polyps.  suggest repeat surveillance study 11/2022.    ? COLONOSCOPY W/ POLYPECTOMY  06/2014  ? Dr Roxy Manns at [facility].  3 adenomatoous polyps, anal fissure.    ? ESOPHAGOGASTRODUODENOSCOPY (EGD) WITH PROPOFOL N/A 01/27/2020  ? Procedure: ESOPHAGOGASTRODUODENOSCOPY (EGD) WITH PROPOFOL;  Surgeon: Mauri Pole, MD;  Location: [place] ENDOSCOPY;  Service: Endoscopy;  Laterality: N/A;  ? IR 3D INDEPENDENT WKST  01/06/2020  ? IR ANGIO INTRA EXTRACRAN SEL INTERNAL CAROTID BILAT MOD SED  01/06/2020  ? IR ANGIO VERTEBRAL SEL VERTEBRAL UNI L MOD SED  01/06/2020  ? IR ANGIOGRAM FOLLOW UP STUDY  01/06/2020  ? IR ANGIOGRAM FOLLOW UP STUDY  01/06/2020  ? IR ANGIOGRAM FOLLOW UP STUDY  01/06/2020  ? IR ANGIOGRAM FOLLOW UP STUDY  01/06/2020  ? IR ANGIOGRAM FOLLOW UP STUDY  01/06/2020  ? IR ANGIOGRAM FOLLOW UP STUDY  01/06/2020  ? IR ANGIOGRAM FOLLOW UP STUDY  01/06/2020  ? IR ANGIOGRAM FOLLOW UP STUDY  01/06/2020  ? IR ANGIOGRAM FOLLOW UP STUDY  01/06/2020  ? IR ANGIOGRAM FOLLOW UP STUDY  01/06/2020  ? IR  NEURO EACH ADD'L AFTER BASIC UNI RIGHT (MS)  01/06/2020  ? IR TRANSCATH/EMBOLIZ  01/06/2020  ? RADIOLOGY WITH ANESTHESIA N/A 01/06/2020  ? Procedure: IR WITH ANESTHESIA FOR ANEURYSM;  Surgeon: Consuella Lose, MD;  Location: [place];  Service: Radiology;  Laterality: N/A;  ? TUBAL LIGATION    ? ? ?There were no vitals filed for this visit. ? ? Subjective Assessment - 07/25/21 1234   ? ? Subjective  Pt reports she forgot her glove again at home today.   ? Patient is accompanied by: Family member   ? Pertinent History ICH 01/06/20, Aneurysm. PMH: OA bilateral knees and hands   ? Repetition Decreases Symptoms   ? Patient Stated Goals Pt would like to be as independent as possible.   ? Currently in Pain? Yes   ? Pain Score 4    ? Pain Location Arm   ? Pain Orientation Right   ? Pain Descriptors / Indicators Aching;Sore;Tightness   ? Pain Type Chronic pain   ? Pain Radiating Towards shoulder, hand, fingers   ? Pain Onset More than a month ago   ? Pain Frequency Intermittent   ? Aggravating Factors  with attemptsat finger flexion or reaching above shoulder level   ? Pain Relieving Factors rest/heat/gentle stretch   ?  Effect of Pain on Daily Activities decreased activity tolerance when using RUE   ? Multiple Pain Sites No   ? Pain Onset More than a month ago   ? ?  ?  ? ?  ? ?Occupational Therapy Treatment: ?Moist heat applied to R shoulder and hand x  5 min for pain reduction/muscle relaxation in prep for therapeutic exercise. ? ?Therapeutic Exercise: ?Passive stretching performed for R shoulder flex/abd and prolonged stretch for R shoulder ER, working to increase R shoulder mobility for UB ADLs.  Performed slow, prolonged stretch for R hand IF and LF MCP, PIP, DIP flexion, and MCP and PIP ext of digits 2-5 with good tolerance.  Performed passive joint blocking stretch for R IF and LF PIP and DIP for increasing flexion.    ? ?Self Care: ?Practiced using R hand as a stabilizer to open pill containers.  Pt able to open easy  open tops grasping bottle with R hand and twisting top with the L, but did not have sufficient R grip strength to manage child proof/push top containers.  Practiced picking up small ball and block with R hand, squeezing to prevent OT from pulling objects out from hand; pt able to resist a slight pull holding objects in R thumb, IF, and LF.  ? ?Response to Treatment: ?Pt reports inconsistent use of flexion glove at home, reporting it's too tight and spouse has a difficult time donning for pt.  OT encouraged pt try to bring in glove each time to therapy to assess fit, and reminded pt to have spouse adjust tension with retying knots on bands as needed.  OT encouraged pt wrap fingers around her stress ball at home for prolonged stretch 5-10 min several times per day if not using flexion glove.  Pt receptive/verbalized understanding.  Good tolerance to therapeutic exercises this day.  Pt was able to lightly grasp small 2-3" objects in R hand and release with 50-75% accuracy, occasionally needing to pull object out with L hand.  Pt will continue to benefit from skilled OT for managing R hand stiffness with therapeutic exercise and flexion glove, managing R shoulder pain, working to increase strength and flexibility throughout RUE, and increasing functional use of RUE for daily tasks.  ? ? ? OT Education - 07/25/21 1235   ? ? Education Details use of flexion glove, slowly increasing wearing time; wrap fingers around ball for prolonged stretch if pt chooses not to use glove   ? Person(s) Educated Patient   ? Methods Explanation;Verbal cues   ? Comprehension Verbalized understanding;Returned demonstration;Tactile cues required;Verbal cues required;Need further instruction   ? ?  ?  ? ?  ? ? ? OT Short Term Goals - 07/04/21 1355   ? ?  ? OT SHORT TERM GOAL #1  ? Title I with initial HEP.   ? Time 6   ? Period Weeks   ? Status Achieved   ? Target Date 04/13/20   ?  ? OT SHORT TERM GOAL #4  ? Title Pt will demonstrate 60* A/ROM  shoulder flexion for RUE with pain less than or equal to 4/10.   ? Baseline Pt. presents with limited isolated  right shoulder flexion with pain continues to be 2. 08/08/20: 40* AROM with 3/10 pain; 03/07/21: Pt demos 75* active shoulder flexion with 2/10 pain or less. 40th visit: Pt. continues to present with limited shoulder ROM, and pain has decreased; 07/04/21 R shoulder active flexion 85, 3/10 pain.   ? Time 4   ?  Period Weeks   ? Status On-going   ? Target Date 08/21/21   ?  ? OT SHORT TERM GOAL #5  ? Title Pt will demonstrate improved LUE fine motor coordination for ADLs as evidenced by decreasing 9 hole peg test score to 25 secs or less.   ? Baseline 10/31/2020: Left FMC: 32 sec. Left FMC 41 sec visa the 9 hole peg test. 08/08/20: 32 seconds via 9 hole peg 40th visit: Left hand: 35 sec.; 07/04/21: L hand 28 sec   ? Time 12   ? Period Weeks   ? Status On-going   ? Target Date 08/21/21   ?  ? OT SHORT TERM GOAL #7  ? Title Pt will demonstrate at least 25% finger flexion/ extension in RUE in prep for functional use.   ? Baseline Limited PROM, and active digit flexion, and extension. Improved AROM for right 2nd, and 3rd digits. significant limitations with the 4th, and 5th digits. 08/08/20: AROM digit MCP 2nd (80*), 3rd (85*), 4th (80*), 5th (90*); 2/21/2: Since Botox injection in Dec, R hand has had limited active movement; see note for details   ? Time 4   ? Period Weeks   ? Status On-going   ? Target Date 09/05/20   ? ?  ?  ? ?  ? ? ? ? OT Long Term Goals - 07/05/21 1417   ? ?  ? OT LONG TERM GOAL #3  ? Title Pt will use RUE as a stabilizer/ gross A at least 30 % of the time for ADLs/ IADLs.   ? Baseline Pt. is using her right hand as a gross stabilizer 15% of the time. 08/08/20: requires MAX cues; 03/07/21 Pt using R hand as a gross stabilizer at least 25% of the time. 40th visit: Pt. has difficulty using her right hand  as a gross stabilizer; 07/04/21: Pt uses RUE as a stabilizer 40-50% of the time for ADLs/IADLs.    ? Time 12   ? Period Weeks   ? Status On-going   ? Target Date 08/21/21   ?  ? OT LONG TERM GOAL #4  ? Title Pt will increase LUE grip strength to 25 lbs or greater for increased ease with ADLs.   ? B

## 2021-07-27 ENCOUNTER — Encounter: Payer: BC Managed Care – PPO | Admitting: Occupational Therapy

## 2021-08-01 ENCOUNTER — Ambulatory Visit: Payer: Medicare PPO

## 2021-08-01 ENCOUNTER — Other Ambulatory Visit: Payer: Self-pay

## 2021-08-01 ENCOUNTER — Encounter: Payer: Medicare PPO | Attending: Physical Medicine and Rehabilitation | Admitting: Physical Medicine and Rehabilitation

## 2021-08-01 ENCOUNTER — Encounter: Payer: Self-pay | Admitting: Physical Medicine and Rehabilitation

## 2021-08-01 VITALS — BP 124/87 | HR 62 | Ht 67.0 in | Wt 190.0 lb

## 2021-08-01 DIAGNOSIS — I69819 Unspecified symptoms and signs involving cognitive functions following other cerebrovascular disease: Secondary | ICD-10-CM | POA: Diagnosis present

## 2021-08-01 DIAGNOSIS — I69398 Other sequelae of cerebral infarction: Secondary | ICD-10-CM | POA: Insufficient documentation

## 2021-08-01 DIAGNOSIS — M7501 Adhesive capsulitis of right shoulder: Secondary | ICD-10-CM | POA: Diagnosis present

## 2021-08-01 DIAGNOSIS — R252 Cramp and spasm: Secondary | ICD-10-CM | POA: Diagnosis present

## 2021-08-01 DIAGNOSIS — R482 Apraxia: Secondary | ICD-10-CM

## 2021-08-01 DIAGNOSIS — I607 Nontraumatic subarachnoid hemorrhage from unspecified intracranial artery: Secondary | ICD-10-CM | POA: Diagnosis present

## 2021-08-01 DIAGNOSIS — M6281 Muscle weakness (generalized): Secondary | ICD-10-CM

## 2021-08-01 DIAGNOSIS — R278 Other lack of coordination: Secondary | ICD-10-CM

## 2021-08-01 MED ORDER — VITAMIN D (ERGOCALCIFEROL) 1.25 MG (50000 UNIT) PO CAPS: 50000.0000 [IU] | ORAL_CAPSULE | ORAL | 0 refills | Status: AC

## 2021-08-01 NOTE — Progress Notes (Signed)
.zts ? ?Subjective:  ? ? Patient ID: Erika Stanton, female    DOB: 07-Mar-1956, 66 y.o.   MRN: [id number] ? ?HPI  ?Erika Stanton is a 66 year old woman who present for follow-up of cerebral aneurysm rupture. ? ?1) Cerebral aneurysm rupture ?-she had brain surgery last week and is recovering well ?-she does have a headache ?-She continues to be unable to use her right hand and this inhibits her from doing her formed work, for which she needed ?-she continues OT ?-she has graduated from PT ? ?3) Cognitive deficits: ?- Her husband notes higher level deficits, worst since the brain injury, she is doing overall better.  ?-She has graduated speech therapy.  ?-She would like to follow-up with neuropsych- she is following with Dr. Sima Matas ? ?4) Breast cancer ?-newly diagnosed ?-Becomes tearful about this, feels overwhelmed ? ?5) Adhesive capsulitis ?-improving ?-pain is 2/10 today. Does not need steroid injection ? ?6) Impatience ?-her husband still notes at time ? ?7) Spasticity ?-more in the lumbricals today ?-they are stretching her every day.  ? ?Pain Inventory ?Average Pain 2 ?Pain Right Now 2 ?My pain is  varies ? ?LOCATION OF PAIN  Hand and fingers ? ?BOWEL ?Number of stools per week:3-4 ?Oral laxative use Yes  ?Type of laxative miralax ?Enema or suppository use No  ?History of colostomy No  ?Incontinent No  ? ?BLADDER ?Normal ?In and out cath, frequency ?Able to self cath No  ?Bladder incontinence No  ?Frequent urination No  ?Leakage with coughing No  ?Difficulty starting stream No  ?Incomplete bladder emptying No  ? ? ?Mobility ?how many minutes can you walk? 10 ?ability to climb steps?  yes ?do you drive?  no ?Do you have any goals in this area?  yes ? ?Function ?disabled: date disabled 01/05/20 ?I need assistance with the following:  feeding, dressing, bathing, toileting, meal prep, household duties and shopping ? ?Neuro/Psych ?No problems in this area ? ?Prior Studies ?Any changes since last visit?  yes  She has  just had her second aneurysm procedure ?Physicians involved in your care ?Any changes since last visit?  no ? ? ?Family History  ?Problem Relation Age of Onset  ? Cancer Mother   ? Congestive Heart Failure Mother   ? Lung cancer Father   ? Lung cancer Sister   ? ?Social History  ? ?Socioeconomic History  ? Marital status: Married  ?  Spouse name: Not on file  ? Number of children: Not on file  ? Years of education: Not on file  ? Highest education level: Not on file  ?Occupational History  ? Not on file  ?Tobacco Use  ? Smoking status: Former  ? Smokeless tobacco: Never  ?Vaping Use  ? Vaping Use: Never used  ?Substance and Sexual Activity  ? Alcohol use: Yes  ?  Comment: RARE  ? Drug use: Not Currently  ? Sexual activity: Not on file  ?Other Topics Concern  ? Not on file  ?Social History Narrative  ? Not on file  ? ?Social Determinants of Health  ? ?Financial Resource Strain: Not on file  ?Food Insecurity: Not on file  ?Transportation Needs: Not on file  ?Physical Activity: Not on file  ?Stress: Not on file  ?Social Connections: Not on file  ? ?Past Surgical History:  ?Procedure Laterality Date  ? CHOLECYSTECTOMY    ? COLONOSCOPY  11/2017  ? at [facility]. no recurrent polyps.  suggest repeat surveillance study 11/2022.    ?  COLONOSCOPY W/ POLYPECTOMY  06/2014  ? Dr Roxy Manns at [facility].  3 adenomatoous polyps, anal fissure.    ? ESOPHAGOGASTRODUODENOSCOPY (EGD) WITH PROPOFOL N/A 01/27/2020  ? Procedure: ESOPHAGOGASTRODUODENOSCOPY (EGD) WITH PROPOFOL;  Surgeon: Mauri Pole, MD;  Location: [facility];  Service: Endoscopy;  Laterality: N/A;  ? IR 3D INDEPENDENT WKST  01/06/2020  ? IR ANGIO INTRA EXTRACRAN SEL INTERNAL CAROTID BILAT MOD SED  01/06/2020  ? IR ANGIO VERTEBRAL SEL VERTEBRAL UNI L MOD SED  01/06/2020  ? IR ANGIOGRAM FOLLOW UP STUDY  01/06/2020  ? IR ANGIOGRAM FOLLOW UP STUDY  01/06/2020  ? IR ANGIOGRAM FOLLOW UP STUDY  01/06/2020  ? IR ANGIOGRAM FOLLOW UP STUDY  01/06/2020  ? IR ANGIOGRAM FOLLOW UP STUDY  01/06/2020   ? IR ANGIOGRAM FOLLOW UP STUDY  01/06/2020  ? IR ANGIOGRAM FOLLOW UP STUDY  01/06/2020  ? IR ANGIOGRAM FOLLOW UP STUDY  01/06/2020  ? IR ANGIOGRAM FOLLOW UP STUDY  01/06/2020  ? IR ANGIOGRAM FOLLOW UP STUDY  01/06/2020  ? IR NEURO EACH ADD'L AFTER BASIC UNI RIGHT (MS)  01/06/2020  ? IR TRANSCATH/EMBOLIZ  01/06/2020  ? RADIOLOGY WITH ANESTHESIA N/A 01/06/2020  ? Procedure: IR WITH ANESTHESIA FOR ANEURYSM;  Surgeon: Consuella Lose, MD;  Location: [facility];  Service: Radiology;  Laterality: N/A;  ? TUBAL LIGATION    ? ?Past Medical History:  ?Diagnosis Date  ? Aneurysm ([place])   ? Melena   ? ?Ht '5\' 7"'$  (1.702 m)   Wt 190 lb (86.2 kg)   BMI 29.76 kg/m?  ? ?Opioid Risk Score:   ?Fall Risk Score:  `1 ? ?Depression screen PHQ 2/9 ? ?Depression screen [facility] 2/9 08/01/2021 07/26/2020  ?Decreased Interest 0 1  ?Down, Depressed, Hopeless 0 1  ?PHQ - 2 Score 0 2  ?Some recent data might be hidden  ? ? ?Review of Systems  ?Constitutional: Negative.   ?HENT: Negative.    ?Eyes: Negative.   ?Respiratory: Negative.    ?Cardiovascular: Negative.   ?Gastrointestinal:  Positive for constipation.  ?Endocrine: Negative.   ?Genitourinary: Negative.   ?Musculoskeletal: Negative.   ?Skin: Negative.   ?Allergic/Immunologic: Negative.   ?Neurological: Negative.   ?Hematological:  Bruises/bleeds easily.  ?     Plavix  ?Psychiatric/Behavioral:  Positive for dysphoric mood.   ?All other systems reviewed and are negative. ? ? ?   ?Objective:  ? Physical Exam ?Gen: no distress, normal appearing ?HEENT: oral mucosa pink and moist, NCAT ?Cardio: Reg rate ?Chest: normal effort, normal rate of breathing ?Abd: soft, non-distended ?Ext: no edema ?Psych: pleasant, normal affect ?Skin: intact ?Neuro: Alert and oriented x3 ?Musculoskeletal: ?RUE with MAS 2 in WF, 4th and 5th digit flexion, 1 in elbow ? ?   ?Assessment & Plan:  ?Erika Stanton is a 66 year old woman who presents for follow-up of cerebral aneurysm rupture ? ?1) Cerebral aneurysm rupture ?-discussed  etiologies for her condition and what is under her control ?-she is recovering well from her surgery last week.  ?-has some headache, otherwise symptom free ?-continue PT HEP ?-I will complete disability form today ?-Neuropsych referral for cognitive testing/counseling ?-f/u MRI  ? ?2) RUE spasticity: ?-skip dysport today as we would need to target the lumbricals, ADM, and  ?-continue TID stretching ? ?3) Anxiety:  ?-continue gabapentin ?-recommended meditation ? ?4) Breast cancer ?-provided emotional support ?-discussed benefits and mechanism of ketogenic diet  ? ?5) Impaired mobility and ADLs: ?-gradually increase walking with a future goal of 30 minutes  per day ? ?6) Adhesive capsulitis: ?-improved ?-continue OT ? ?7) Prediabetes: ?-recommended walking after meals ?-recommended checking HgbA1c next appointment.  ?-discussed last glucose was 100.  ?-avoid sugar, bread, pasta, rice ?-avoid snacking ?-perform daily foot exam and at least annual eye exam ?-try to incorporate into your diet some of the following foods which are good for diabetes: ?1) cinnamon- imitates effects of insulin, increasing glucose transport into cells ([place] or [place] cinnamon is best, least processed) ?2) nuts- can slow down the blood sugar response of carbohydrate rich foods ?3) oatmeal- contains and anti-inflammatory compound avenanthramide ?4) whole-milk yogurt (best types are no sugar, [place] yogurt, or goat/sheep yogurt) ?5) beans- high in protein, fiber, and vitamins, low glycemic index ?6) broccoli- great source of vitamin A and C ?7) quinoa- higher in protein and fiber than other grains ?8) spinach- high in vitamin A, fiber, and protein ?9) olive oil- reduces glucose levels, LDL, and triglycerides ?10) salmon- excellent amount of omega-3-fatty acids ?11) walnuts- rich in antioxidants ?12) apples- high in fiber and quercetin ?13) carrots- highly nutritious with low impact on blood sugar ?14) eggs- improve HDL (good cholesterol),  high in protein, keep you satiated ?15) turmeric: improves blood sugars, cardiovascular disease, and protects kidney health ?16) garlic: improves blood sugar, blood pressure, pain ?17) tomatoes: highly nutri

## 2021-08-02 NOTE — Therapy (Signed)
[facility] ?[place], [place], [address] ?Phone: [phone]   Fax:  [phone] ? ?Occupational Therapy Treatment ? ?Patient Details  ?Name: Erika Stanton ?MRN: [id number] ?Date of Birth: 11/14/1955 ?No data recorded ? ?Encounter Date: 08/01/2021 ? ? OT End of Session - 08/02/21 0925   ? ? Visit Number [id number]   ? Number of Visits 148   ? Date for OT Re-Evaluation 08/21/21   ? Authorization Type Progress report period starting 07/06/21   ? Authorization Time Period Medicare   ? OT Start Time 1300   ? OT Stop Time 6301   ? OT Time Calculation (min) 43 min   ? Activity Tolerance Patient tolerated treatment well   ? Behavior During Therapy [facility] for tasks assessed/performed   ? ?  ?  ? ?  ? ? ?Past Medical History:  ?Diagnosis Date  ? Aneurysm ([place])   ? Melena   ? ? ?Past Surgical History:  ?Procedure Laterality Date  ? CHOLECYSTECTOMY    ? COLONOSCOPY  11/2017  ? at [facility]. no recurrent polyps.  suggest repeat surveillance study 11/2022.    ? COLONOSCOPY W/ POLYPECTOMY  06/2014  ? Dr Roxy Manns at [facility].  3 adenomatoous polyps, anal fissure.    ? ESOPHAGOGASTRODUODENOSCOPY (EGD) WITH PROPOFOL N/A 01/27/2020  ? Procedure: ESOPHAGOGASTRODUODENOSCOPY (EGD) WITH PROPOFOL;  Surgeon: Mauri Pole, MD;  Location: [facility];  Service: Endoscopy;  Laterality: N/A;  ? IR 3D INDEPENDENT WKST  01/06/2020  ? IR ANGIO INTRA EXTRACRAN SEL INTERNAL CAROTID BILAT MOD SED  01/06/2020  ? IR ANGIO VERTEBRAL SEL VERTEBRAL UNI L MOD SED  01/06/2020  ? IR ANGIOGRAM FOLLOW UP STUDY  01/06/2020  ? IR ANGIOGRAM FOLLOW UP STUDY  01/06/2020  ? IR ANGIOGRAM FOLLOW UP STUDY  01/06/2020  ? IR ANGIOGRAM FOLLOW UP STUDY  01/06/2020  ? IR ANGIOGRAM FOLLOW UP STUDY  01/06/2020  ? IR ANGIOGRAM FOLLOW UP STUDY  01/06/2020  ? IR ANGIOGRAM FOLLOW UP STUDY  01/06/2020  ? IR ANGIOGRAM FOLLOW UP STUDY  01/06/2020  ? IR ANGIOGRAM FOLLOW UP STUDY  01/06/2020  ? IR ANGIOGRAM FOLLOW UP STUDY  01/06/2020  ? IR  NEURO EACH ADD'L AFTER BASIC UNI RIGHT (MS)  01/06/2020  ? IR TRANSCATH/EMBOLIZ  01/06/2020  ? RADIOLOGY WITH ANESTHESIA N/A 01/06/2020  ? Procedure: IR WITH ANESTHESIA FOR ANEURYSM;  Surgeon: Consuella Lose, MD;  Location: [facility];  Service: Radiology;  Laterality: N/A;  ? TUBAL LIGATION    ? ? ?There were no vitals filed for this visit. ? ? Subjective Assessment - 08/01/21 0920   ? ? Subjective  Pt reports she has her follow up with Dr. Ranell Patrick today about Botox.   ? Patient is accompanied by: Family member   ? Pertinent History ICH 01/06/20, Aneurysm. PMH: OA bilateral knees and hands   ? Repetition Decreases Symptoms   ? Patient Stated Goals Pt would like to be as independent as possible.   ? Currently in Pain? Yes   ? Pain Score 4    ? Pain Location Arm   ? Pain Orientation Right   ? Pain Descriptors / Indicators Aching;Sore;Tightness   ? Pain Type Chronic pain   ? Pain Radiating Towards shoulder, hand, fingers   ? Pain Onset More than a month ago   ? Pain Frequency Intermittent   ? Aggravating Factors  with attempts at finger flexion or reaching above shoulder level   ? Pain  Relieving Factors rest/heat/gentle stretch   ? Effect of Pain on Daily Activities decreased activity tolerance when using RUE   ? Multiple Pain Sites No   ? Pain Onset More than a month ago   ? ?  ?  ? ?  ?Occupational Therapy Treatment: ?Hotpack: ?Moist heat applied to R shoulder and hand x 5 min for pain reduction/muscle relaxation in prep for therapeutic exercise. ?  ?Therapeutic Exercise: ?Passive stretching performed for R shoulder flex/abd and prolonged stretch for R shoulder ER, working to increase R shoulder mobility for UB ADLs.  Performed slow, prolonged stretch for R hand IF and LF MCP, PIP, DIP flexion, and MCP and PIP ext of digits 2-5 with good tolerance.  Assisted pt to don flexion glove and kept on x10 min while performing R shoulder ROM noted above.  Facilitated active R shoulder flexion/abd/horiz abd/adduction and  grasp/release, working to reach for and stack cones at table top level.  Pt required extra time for grasp d/t spasticity in R hand, and occasional assist from L hand to set up cone in R hand; able to release cone by lifting hand to top of cone.  ? ?Response to Treatment: ?See Plan/clinical impression below. ? ? ? ? OT Education - 08/01/21 0924   ? ? Education Details consistent use of flexion glove, short duration (5-10 min) as tolerated   ? Person(s) Educated Patient   ? Methods Explanation;Verbal cues   ? Comprehension Verbalized understanding;Returned demonstration;Tactile cues required;Verbal cues required;Need further instruction   ? ?  ?  ? ?  ? ? ? OT Short Term Goals - 07/04/21 1355   ? ?  ? OT SHORT TERM GOAL #1  ? Title I with initial HEP.   ? Time 6   ? Period Weeks   ? Status Achieved   ? Target Date 04/13/20   ?  ? OT SHORT TERM GOAL #4  ? Title Pt will demonstrate 60* A/ROM shoulder flexion for RUE with pain less than or equal to 4/10.   ? Baseline Pt. presents with limited isolated  right shoulder flexion with pain continues to be 2. 08/08/20: 40* AROM with 3/10 pain; 03/07/21: Pt demos 75* active shoulder flexion with 2/10 pain or less. 40th visit: Pt. continues to present with limited shoulder ROM, and pain has decreased; 07/04/21 R shoulder active flexion 85, 3/10 pain.   ? Time 4   ? Period Weeks   ? Status On-going   ? Target Date 08/21/21   ?  ? OT SHORT TERM GOAL #5  ? Title Pt will demonstrate improved LUE fine motor coordination for ADLs as evidenced by decreasing 9 hole peg test score to 25 secs or less.   ? Baseline 10/31/2020: Left FMC: 32 sec. Left FMC 41 sec visa the 9 hole peg test. 08/08/20: 32 seconds via 9 hole peg 40th visit: Left hand: 35 sec.; 07/04/21: L hand 28 sec   ? Time 12   ? Period Weeks   ? Status On-going   ? Target Date 08/21/21   ?  ? OT SHORT TERM GOAL #7  ? Title Pt will demonstrate at least 25% finger flexion/ extension in RUE in prep for functional use.   ? Baseline  Limited PROM, and active digit flexion, and extension. Improved AROM for right 2nd, and 3rd digits. significant limitations with the 4th, and 5th digits. 08/08/20: AROM digit MCP 2nd (80*), 3rd (85*), 4th (80*), 5th (90*); 2/21/2: Since Botox injection in Dec, R  hand has had limited active movement; see note for details   ? Time 4   ? Period Weeks   ? Status On-going   ? Target Date 09/05/20   ? ?  ?  ? ?  ? ? ? ? OT Long Term Goals - 07/05/21 1417   ? ?  ? OT LONG TERM GOAL #3  ? Title Pt will use RUE as a stabilizer/ gross A at least 30 % of the time for ADLs/ IADLs.   ? Baseline Pt. is using her right hand as a gross stabilizer 15% of the time. 08/08/20: requires MAX cues; 03/07/21 Pt using R hand as a gross stabilizer at least 25% of the time. 40th visit: Pt. has difficulty using her right hand  as a gross stabilizer; 07/04/21: Pt uses RUE as a stabilizer 40-50% of the time for ADLs/IADLs.   ? Time 12   ? Period Weeks   ? Status On-going   ? Target Date 08/21/21   ?  ? OT LONG TERM GOAL #4  ? Title Pt will increase LUE grip strength to 25 lbs or greater for increased ease with ADLs.   ? Baseline 12/13/20: 24# left, 1# right 10/31/2020: Pt. continues to present with limited left grip. Pt. has improved with left grip strength. Pt.continues to work towards 25#. 08/08/20: L grip 19#; 03/07/21: L grip 21 lbs (R 1 lb) 05/18/2021: Left grip strength: 30#; 07/04/21:L grip 21#, R 0#   ? Time 12   ? Period Weeks   ? Status On-going   ? Target Date 08/21/21   ?  ? OT LONG TERM GOAL #5  ? Title 10/31/2020: Pt. is now perfroming laundry, loading the dishwasher, assists some with cooking, light meal preparation, setting the table   ? Baseline 12/13/2020:  continues to assist as needed but uses left hand .  10/13/20: Pt. assists with filling the dishwasher, and performing laundry. Pt. continues to try to help when she can, however her husband mostly performs these tasks. 08/08/20: Husband performs; 03/07/21: Pt assists with laundry, loading  dishwasher, light cooking/meal prep; mostly using LUE. 05/18/2021: pt. continues to assist with laundry skills, loading the dishwasher, light cooking using the left hand; 07/04/21: using L hand for above noted tasks

## 2021-08-03 ENCOUNTER — Other Ambulatory Visit: Payer: Self-pay

## 2021-08-03 ENCOUNTER — Ambulatory Visit: Payer: Medicare PPO | Admitting: Occupational Therapy

## 2021-08-03 DIAGNOSIS — R278 Other lack of coordination: Secondary | ICD-10-CM

## 2021-08-03 DIAGNOSIS — M79641 Pain in right hand: Secondary | ICD-10-CM

## 2021-08-03 DIAGNOSIS — M6281 Muscle weakness (generalized): Secondary | ICD-10-CM | POA: Diagnosis not present

## 2021-08-03 DIAGNOSIS — I69159 Hemiplegia and hemiparesis following nontraumatic intracerebral hemorrhage affecting unspecified side: Secondary | ICD-10-CM

## 2021-08-05 ENCOUNTER — Encounter: Payer: Self-pay | Admitting: Occupational Therapy

## 2021-08-05 NOTE — Therapy (Addendum)
[place] ?[facility] ?[place], [place], [address] ?Phone: [phone]   Fax:  [phone] ? ?Occupational Therapy Treatment ? ?Patient Details  ?Name: Erika Stanton ?MRN: [id number] ?Date of Birth: 1955/06/26 ?No data recorded ? ?Encounter Date: 08/03/2021 ? ? OT End of Session - 08/05/21 1521   ? ? Visit Number [id number]   ? Number of Visits 148   ? Date for OT Re-Evaluation 08/21/21   ? Authorization Type Progress report period starting 07/06/21   ? Authorization Time Period Medicare   ? OT Start Time 1301   ? OT Stop Time 1347   ? OT Time Calculation (min) 46 min   ? Activity Tolerance Patient tolerated treatment well   ? Behavior During Therapy [facility] for tasks assessed/performed   ? ?  ?  ? ?  ? ? ?Past Medical History:  ?Diagnosis Date  ? Aneurysm ([place])   ? Melena   ? ? ?Past Surgical History:  ?Procedure Laterality Date  ? CHOLECYSTECTOMY    ? COLONOSCOPY  11/2017  ? at [facility]. no recurrent polyps.  suggest repeat surveillance study 11/2022.    ? COLONOSCOPY W/ POLYPECTOMY  06/2014  ? Dr Roxy Manns at [facility].  3 adenomatoous polyps, anal fissure.    ? ESOPHAGOGASTRODUODENOSCOPY (EGD) WITH PROPOFOL N/A 01/27/2020  ? Procedure: ESOPHAGOGASTRODUODENOSCOPY (EGD) WITH PROPOFOL;  Surgeon: Mauri Pole, MD;  Location: [facility];  Service: Endoscopy;  Laterality: N/A;  ? IR 3D INDEPENDENT WKST  01/06/2020  ? IR ANGIO INTRA EXTRACRAN SEL INTERNAL CAROTID BILAT MOD SED  01/06/2020  ? IR ANGIO VERTEBRAL SEL VERTEBRAL UNI L MOD SED  01/06/2020  ? IR ANGIOGRAM FOLLOW UP STUDY  01/06/2020  ? IR ANGIOGRAM FOLLOW UP STUDY  01/06/2020  ? IR ANGIOGRAM FOLLOW UP STUDY  01/06/2020  ? IR ANGIOGRAM FOLLOW UP STUDY  01/06/2020  ? IR ANGIOGRAM FOLLOW UP STUDY  01/06/2020  ? IR ANGIOGRAM FOLLOW UP STUDY  01/06/2020  ? IR ANGIOGRAM FOLLOW UP STUDY  01/06/2020  ? IR ANGIOGRAM FOLLOW UP STUDY  01/06/2020  ? IR ANGIOGRAM FOLLOW UP STUDY  01/06/2020  ? IR ANGIOGRAM FOLLOW UP STUDY  01/06/2020  ? IR  NEURO EACH ADD'L AFTER BASIC UNI RIGHT (MS)  01/06/2020  ? IR TRANSCATH/EMBOLIZ  01/06/2020  ? RADIOLOGY WITH ANESTHESIA N/A 01/06/2020  ? Procedure: IR WITH ANESTHESIA FOR ANEURYSM;  Surgeon: Consuella Lose, MD;  Location: [place];  Service: Radiology;  Laterality: N/A;  ? TUBAL LIGATION    ? ? ?There were no vitals filed for this visit. ? ? Subjective Assessment - 08/05/21 1511   ? ? Subjective  Pt reports she decided not to get dysport (similar to botox) since she had a reaction last time.   ? Pertinent History ICH 01/06/20, Aneurysm. PMH: OA bilateral knees and hands   ? Patient Stated Goals Pt would like to be as independent as possible.   ? Currently in Pain? Yes   ? Pain Score 3    ? Pain Location Hand   ? Pain Orientation Right   ? Pain Descriptors / Indicators Aching;Sore;Tightness   ? Pain Type Chronic pain   ? Pain Onset More than a month ago   ? ?  ?  ? ?  ? ? ?Moist heat applied to right shoulder, hand and wrist for 5 mins prior to therapeutic exercise to decrease pain, increase motion and increase tissue mobility. ?  ?Therapeutic Exercises: ?Performed slow, passive stretching  for R IF and LF MP, PIP, and DIP flexion, and passive extension stretching for RF and SF at MP and PIP joints to tolerance.  Continued attempts at active digit blocking exercises for RF and LF DIP and PIP joints with only trace movement. ?Reviewed use of flexion glove this date, pt requires max assist to don glove, adjusted rubber bands and applied prolonged stretch in increments during session.  Pt was instructed on strategies for donning glove with greater ease, she reports her husband tried one time to place it on her hand and struggled and could not get it on.  If pt would like to have husband come into therapy session, we can instruct him on how to don for home stretching program.   ?Gross grasp and release but performed with index and middle in extension. ?  ?Response to tx: ?Pt was issued and instructed on donning flexion  glove in past sessions however she reported her husband could not don at home.  Recommend husband come in for further instruction on donning for use at home.  She would benefit from daily use of flexion glove to impact her passive range of motion.  Pt did not receive dysport injection at her last appt, still has another month before it would be time to receive another injection, she discussed with her MD regarding the reaction she had last time and her current state with index and middle finger in extension with difficulty moving into flexion even passively.  Continue to work towards goals in plan of care to improve RUE ROM, strength and use as a gross assist in ADL and IADL tasks.  ? ? ? ? ? ? ? ? ? ? ? ? ? ? ? ? ? ? ? OT Education - 08/05/21 1520   ? ? Education Details consistent use of flexion glove, short duration (5-10 min) as tolerated   ? Person(s) Educated Patient   ? Methods Explanation;Verbal cues   ? Comprehension Verbalized understanding;Returned demonstration;Tactile cues required;Verbal cues required;Need further instruction   ? ?  ?  ? ?  ? ? ? OT Short Term Goals - 07/04/21 1355   ? ?  ? OT SHORT TERM GOAL #1  ? Title I with initial HEP.   ? Time 6   ? Period Weeks   ? Status Achieved   ? Target Date 04/13/20   ?  ? OT SHORT TERM GOAL #4  ? Title Pt will demonstrate 60* A/ROM shoulder flexion for RUE with pain less than or equal to 4/10.   ? Baseline Pt. presents with limited isolated  right shoulder flexion with pain continues to be 2. 08/08/20: 40* AROM with 3/10 pain; 03/07/21: Pt demos 75* active shoulder flexion with 2/10 pain or less. 40th visit: Pt. continues to present with limited shoulder ROM, and pain has decreased; 07/04/21 R shoulder active flexion 85, 3/10 pain.   ? Time 4   ? Period Weeks   ? Status On-going   ? Target Date 08/21/21   ?  ? OT SHORT TERM GOAL #5  ? Title Pt will demonstrate improved LUE fine motor coordination for ADLs as evidenced by decreasing 9 hole peg test score to  25 secs or less.   ? Baseline 10/31/2020: Left FMC: 32 sec. Left FMC 41 sec visa the 9 hole peg test. 08/08/20: 32 seconds via 9 hole peg 40th visit: Left hand: 35 sec.; 07/04/21: L hand 28 sec   ? Time 12   ? Period  Weeks   ? Status On-going   ? Target Date 08/21/21   ?  ? OT SHORT TERM GOAL #7  ? Title Pt will demonstrate at least 25% finger flexion/ extension in RUE in prep for functional use.   ? Baseline Limited PROM, and active digit flexion, and extension. Improved AROM for right 2nd, and 3rd digits. significant limitations with the 4th, and 5th digits. 08/08/20: AROM digit MCP 2nd (80*), 3rd (85*), 4th (80*), 5th (90*); 2/21/2: Since Botox injection in Dec, R hand has had limited active movement; see note for details   ? Time 4   ? Period Weeks   ? Status On-going   ? Target Date 09/05/20   ? ?  ?  ? ?  ? ? ? ? OT Long Term Goals - 07/05/21 1417   ? ?  ? OT LONG TERM GOAL #3  ? Title Pt will use RUE as a stabilizer/ gross A at least 30 % of the time for ADLs/ IADLs.   ? Baseline Pt. is using her right hand as a gross stabilizer 15% of the time. 08/08/20: requires MAX cues; 03/07/21 Pt using R hand as a gross stabilizer at least 25% of the time. 40th visit: Pt. has difficulty using her right hand  as a gross stabilizer; 07/04/21: Pt uses RUE as a stabilizer 40-50% of the time for ADLs/IADLs.   ? Time 12   ? Period Weeks   ? Status On-going   ? Target Date 08/21/21   ?  ? OT LONG TERM GOAL #4  ? Title Pt will increase LUE grip strength to 25 lbs or greater for increased ease with ADLs.   ? Baseline 12/13/20: 24# left, 1# right 10/31/2020: Pt. continues to present with limited left grip. Pt. has improved with left grip strength. Pt.continues to work towards 25#. 08/08/20: L grip 19#; 03/07/21: L grip 21 lbs (R 1 lb) 05/18/2021: Left grip strength: 30#; 07/04/21:L grip 21#, R 0#   ? Time 12   ? Period Weeks   ? Status On-going   ? Target Date 08/21/21   ?  ? OT LONG TERM GOAL #5  ? Title 10/31/2020: Pt. is now perfroming  laundry, loading the dishwasher, assists some with cooking, light meal preparation, setting the table   ? Baseline 12/13/2020:  continues to assist as needed but uses left hand .  10/13/20: Pt. assists with fill

## 2021-08-08 ENCOUNTER — Encounter: Payer: BC Managed Care – PPO | Admitting: Occupational Therapy

## 2021-08-10 ENCOUNTER — Ambulatory Visit: Payer: Medicare PPO | Admitting: Occupational Therapy

## 2021-08-10 DIAGNOSIS — R278 Other lack of coordination: Secondary | ICD-10-CM

## 2021-08-10 DIAGNOSIS — M6281 Muscle weakness (generalized): Secondary | ICD-10-CM

## 2021-08-10 DIAGNOSIS — M79641 Pain in right hand: Secondary | ICD-10-CM

## 2021-08-10 DIAGNOSIS — I69159 Hemiplegia and hemiparesis following nontraumatic intracerebral hemorrhage affecting unspecified side: Secondary | ICD-10-CM

## 2021-08-12 ENCOUNTER — Encounter: Payer: Self-pay | Admitting: Occupational Therapy

## 2021-08-12 NOTE — Therapy (Signed)
[place] ?[facility] ?[place], [place], [address] ?Phone: [phone]   Fax:  [phone] ? ?Occupational Therapy Treatment ? ?Patient Details  ?Name: Erika Stanton ?MRN: [id number] ?Date of Birth: December 14, 1955 ?No data recorded ? ?Encounter Date: 08/10/2021 ? ? OT End of Session - 08/12/21 1755   ? ? Visit Number 129   ? Number of Visits 148   ? Date for OT Re-Evaluation 08/21/21   ? Authorization Type Progress report period starting 07/06/21   ? Authorization Time Period Medicare   ? OT Start Time 1300   ? OT Stop Time 1345   ? OT Time Calculation (min) 45 min   ? Activity Tolerance Patient tolerated treatment well   ? Behavior During Therapy [facility] for tasks assessed/performed   ? ?  ?  ? ?  ? ? ?Past Medical History:  ?Diagnosis Date  ? Aneurysm ([place])   ? Melena   ? ? ?Past Surgical History:  ?Procedure Laterality Date  ? CHOLECYSTECTOMY    ? COLONOSCOPY  11/2017  ? at [facility]. no recurrent polyps.  suggest repeat surveillance study 11/2022.    ? COLONOSCOPY W/ POLYPECTOMY  06/2014  ? Dr Roxy Manns at [facility].  3 adenomatoous polyps, anal fissure.    ? ESOPHAGOGASTRODUODENOSCOPY (EGD) WITH PROPOFOL N/A 01/27/2020  ? Procedure: ESOPHAGOGASTRODUODENOSCOPY (EGD) WITH PROPOFOL;  Surgeon: Mauri Pole, MD;  Location: [facility];  Service: Endoscopy;  Laterality: N/A;  ? IR 3D INDEPENDENT WKST  01/06/2020  ? IR ANGIO INTRA EXTRACRAN SEL INTERNAL CAROTID BILAT MOD SED  01/06/2020  ? IR ANGIO VERTEBRAL SEL VERTEBRAL UNI L MOD SED  01/06/2020  ? IR ANGIOGRAM FOLLOW UP STUDY  01/06/2020  ? IR ANGIOGRAM FOLLOW UP STUDY  01/06/2020  ? IR ANGIOGRAM FOLLOW UP STUDY  01/06/2020  ? IR ANGIOGRAM FOLLOW UP STUDY  01/06/2020  ? IR ANGIOGRAM FOLLOW UP STUDY  01/06/2020  ? IR ANGIOGRAM FOLLOW UP STUDY  01/06/2020  ? IR ANGIOGRAM FOLLOW UP STUDY  01/06/2020  ? IR ANGIOGRAM FOLLOW UP STUDY  01/06/2020  ? IR ANGIOGRAM FOLLOW UP STUDY  01/06/2020  ? IR ANGIOGRAM FOLLOW UP STUDY  01/06/2020  ? IR  NEURO EACH ADD'L AFTER BASIC UNI RIGHT (MS)  01/06/2020  ? IR TRANSCATH/EMBOLIZ  01/06/2020  ? RADIOLOGY WITH ANESTHESIA N/A 01/06/2020  ? Procedure: IR WITH ANESTHESIA FOR ANEURYSM;  Surgeon: Consuella Lose, MD;  Location: [place];  Service: Radiology;  Laterality: N/A;  ? TUBAL LIGATION    ? ? ?There were no vitals filed for this visit. ? ? Subjective Assessment - 08/11/21 1754   ? ? Subjective  Pt reports she went to her MD appt.  Had an MRI last week, the anerysm is the same size and she cannot have any more coils put in.  Will check again in 6 months, if she has to have any new stents they would need to go thru the skull.   ? Pertinent History ICH 01/06/20, Aneurysm. PMH: OA bilateral knees and hands   ? Patient Stated Goals Pt would like to be as independent as possible.   ? Currently in Pain? Yes   ? Pain Score 3    ? Pain Location Hand   ? Pain Orientation Right   ? Pain Descriptors / Indicators Aching;Sore   ? Pain Type Chronic pain   ? Pain Onset More than a month ago   ? Pain Frequency Intermittent   ? ?  ?  ? ?  ? ?  Moist heat applied to right shoulder, hand and wrist for 5 mins prior to therapeutic exercise to decrease pain, increase motion and increase tissue mobility. ?  ?Therapeutic Exercises: ?Performed slow, passive stretching for R IF and LF MP, PIP, and DIP flexion, and passive extension stretching for RF and SF at MP and PIP joints to tolerance.  Continued attempts at active digit blocking exercises for RF and LF DIP and PIP joints with only trace movement. ?Reviewed use of flexion glove again this date, pt continues to require max assist to don glove.  Pt reports she has not been wearing the glove due to difficulty for husband to don.  Discussed the importance of daily use for ROM and stretching.   ?Wrapped right hand with ace wrap with small ball in palm for promotion of finger flexion, prolonged stretching for multiple trials.  ?Pt performing reaching tasks to place item on tabletop and remove  for repeated trials.  Cues for decreasing hiking at shoulder. ? ?Response to tx: ?Pt continues to demonstrate limited active movement in right digits, especially in index and middle finger.  She has not been wearing flexion glove at home and reports it is difficult for her husband to don.  She did not bring the glove in this session, worked on wrapping hand in flexion with small ball in palm.  Pt able to tolerate for short periods and multiple trials to work towards flexion of digits.  Pt would benefit from use of flexion glove at home, will instruct husband on donning if he is willing to come into therapy session.   ? ? ? ? ? ? ? ? ? ? ? ? ? ? ? ? ? ? ? ? OT Education - 08/12/21 1754   ? ? Education Details consistent use of flexion glove, short duration (5-10 min) as tolerated   ? Person(s) Educated Patient   ? Methods Explanation;Verbal cues   ? Comprehension Verbalized understanding;Returned demonstration;Tactile cues required;Verbal cues required;Need further instruction   ? ?  ?  ? ?  ? ? ? OT Short Term Goals - 07/04/21 1355   ? ?  ? OT SHORT TERM GOAL #1  ? Title I with initial HEP.   ? Time 6   ? Period Weeks   ? Status Achieved   ? Target Date 04/13/20   ?  ? OT SHORT TERM GOAL #4  ? Title Pt will demonstrate 60* A/ROM shoulder flexion for RUE with pain less than or equal to 4/10.   ? Baseline Pt. presents with limited isolated  right shoulder flexion with pain continues to be 2. 08/08/20: 40* AROM with 3/10 pain; 03/07/21: Pt demos 75* active shoulder flexion with 2/10 pain or less. 40th visit: Pt. continues to present with limited shoulder ROM, and pain has decreased; 07/04/21 R shoulder active flexion 85, 3/10 pain.   ? Time 4   ? Period Weeks   ? Status On-going   ? Target Date 08/21/21   ?  ? OT SHORT TERM GOAL #5  ? Title Pt will demonstrate improved LUE fine motor coordination for ADLs as evidenced by decreasing 9 hole peg test score to 25 secs or less.   ? Baseline 10/31/2020: Left FMC: 32 sec. Left  FMC 41 sec visa the 9 hole peg test. 08/08/20: 32 seconds via 9 hole peg 40th visit: Left hand: 35 sec.; 07/04/21: L hand 28 sec   ? Time 12   ? Period Weeks   ? Status On-going   ?  Target Date 08/21/21   ?  ? OT SHORT TERM GOAL #7  ? Title Pt will demonstrate at least 25% finger flexion/ extension in RUE in prep for functional use.   ? Baseline Limited PROM, and active digit flexion, and extension. Improved AROM for right 2nd, and 3rd digits. significant limitations with the 4th, and 5th digits. 08/08/20: AROM digit MCP 2nd (80*), 3rd (85*), 4th (80*), 5th (90*); 2/21/2: Since Botox injection in Dec, R hand has had limited active movement; see note for details   ? Time 4   ? Period Weeks   ? Status On-going   ? Target Date 09/05/20   ? ?  ?  ? ?  ? ? ? ? OT Long Term Goals - 07/05/21 1417   ? ?  ? OT LONG TERM GOAL #3  ? Title Pt will use RUE as a stabilizer/ gross A at least 30 % of the time for ADLs/ IADLs.   ? Baseline Pt. is using her right hand as a gross stabilizer 15% of the time. 08/08/20: requires MAX cues; 03/07/21 Pt using R hand as a gross stabilizer at least 25% of the time. 40th visit: Pt. has difficulty using her right hand  as a gross stabilizer; 07/04/21: Pt uses RUE as a stabilizer 40-50% of the time for ADLs/IADLs.   ? Time 12   ? Period Weeks   ? Status On-going   ? Target Date 08/21/21   ?  ? OT LONG TERM GOAL #4  ? Title Pt will increase LUE grip strength to 25 lbs or greater for increased ease with ADLs.   ? Baseline 12/13/20: 24# left, 1# right 10/31/2020: Pt. continues to present with limited left grip. Pt. has improved with left grip strength. Pt.continues to work towards 25#. 08/08/20: L grip 19#; 03/07/21: L grip 21 lbs (R 1 lb) 05/18/2021: Left grip strength: 30#; 07/04/21:L grip 21#, R 0#   ? Time 12   ? Period Weeks   ? Status On-going   ? Target Date 08/21/21   ?  ? OT LONG TERM GOAL #5  ? Title 10/31/2020: Pt. is now perfroming laundry, loading the dishwasher, assists some with cooking, light  meal preparation, setting the table   ? Baseline 12/13/2020:  continues to assist as needed but uses left hand .  10/13/20: Pt. assists with filling the dishwasher, and performing laundry. Pt. continues to tr

## 2021-08-15 ENCOUNTER — Encounter: Payer: Medicare PPO | Admitting: Occupational Therapy

## 2021-08-17 ENCOUNTER — Ambulatory Visit: Payer: Medicare PPO | Attending: Physical Medicine & Rehabilitation | Admitting: Occupational Therapy

## 2021-08-17 DIAGNOSIS — R482 Apraxia: Secondary | ICD-10-CM | POA: Diagnosis present

## 2021-08-17 DIAGNOSIS — R278 Other lack of coordination: Secondary | ICD-10-CM | POA: Diagnosis present

## 2021-08-17 DIAGNOSIS — I69159 Hemiplegia and hemiparesis following nontraumatic intracerebral hemorrhage affecting unspecified side: Secondary | ICD-10-CM | POA: Diagnosis present

## 2021-08-17 DIAGNOSIS — M6281 Muscle weakness (generalized): Secondary | ICD-10-CM | POA: Diagnosis present

## 2021-08-17 DIAGNOSIS — M25511 Pain in right shoulder: Secondary | ICD-10-CM | POA: Diagnosis present

## 2021-08-17 DIAGNOSIS — M79641 Pain in right hand: Secondary | ICD-10-CM | POA: Insufficient documentation

## 2021-08-19 NOTE — Therapy (Addendum)
[place] [facility] ?[place], [place], [address] ?Phone: [phone]   Fax:  [phone] ? ?Occupational Therapy Treatment/Progress Update ?Reporting period from 1/94/1740 to 08/17/2021/Recertification ? ?Patient Details  ?Name: Erika Stanton ?MRN: [id number] ?Date of Birth: [birth year] ?No data recorded ? ?Encounter Date: 08/17/2021 ? ? OT End of Session - 08/19/21 2151   ? ? Visit Number 130   ? Number of Visits 154  ? Date for OT Re-Evaluation 11/09/21   ? Authorization Type Progress report period starting 07/06/21   ? Authorization Time Period Medicare   ? OT Start Time 1301   ? OT Stop Time 1350   ? OT Time Calculation (min) 49 min   ? Activity Tolerance Patient tolerated treatment well   ? Behavior During Therapy [facility] for tasks assessed/performed   ? ?  ?  ? ?  ? ? ?Past Medical History:  ?Diagnosis Date  ? Aneurysm ([place])   ? Melena   ? ? ?Past Surgical History:  ?Procedure Laterality Date  ? CHOLECYSTECTOMY    ? COLONOSCOPY  11/2017  ? at [facility]. no recurrent polyps.  suggest repeat surveillance study 11/2022.    ? COLONOSCOPY W/ POLYPECTOMY  06/2014  ? Dr Roxy Manns at [facility].  3 adenomatoous polyps, anal fissure.    ? ESOPHAGOGASTRODUODENOSCOPY (EGD) WITH PROPOFOL N/A 01/27/2020  ? Procedure: ESOPHAGOGASTRODUODENOSCOPY (EGD) WITH PROPOFOL;  Surgeon: Mauri Pole, MD;  Location: [facility];  Service: Endoscopy;  Laterality: N/A;  ? IR 3D INDEPENDENT WKST  01/06/2020  ? IR ANGIO INTRA EXTRACRAN SEL INTERNAL CAROTID BILAT MOD SED  01/06/2020  ? IR ANGIO VERTEBRAL SEL VERTEBRAL UNI L MOD SED  01/06/2020  ? IR ANGIOGRAM FOLLOW UP STUDY  01/06/2020  ? IR ANGIOGRAM FOLLOW UP STUDY  01/06/2020  ? IR ANGIOGRAM FOLLOW UP STUDY  01/06/2020  ? IR ANGIOGRAM FOLLOW UP STUDY  01/06/2020  ? IR ANGIOGRAM FOLLOW UP STUDY  01/06/2020  ? IR ANGIOGRAM FOLLOW UP STUDY  01/06/2020  ? IR ANGIOGRAM FOLLOW UP STUDY  01/06/2020  ? IR ANGIOGRAM FOLLOW UP STUDY  01/06/2020  ? IR ANGIOGRAM  FOLLOW UP STUDY  01/06/2020  ? IR ANGIOGRAM FOLLOW UP STUDY  01/06/2020  ? IR NEURO EACH ADD'L AFTER BASIC UNI RIGHT (MS)  01/06/2020  ? IR TRANSCATH/EMBOLIZ  01/06/2020  ? RADIOLOGY WITH ANESTHESIA N/A 01/06/2020  ? Procedure: IR WITH ANESTHESIA FOR ANEURYSM;  Surgeon: Consuella Lose, MD;  Location: [place];  Service: Radiology;  Laterality: N/A;  ? TUBAL LIGATION    ? ? ?There were no vitals filed for this visit. ? ? Subjective Assessment - 08/19/21 2150   ? ? Subjective  Pt reports   ? Pertinent History ICH 01/06/20, Aneurysm. PMH: OA bilateral knees and hands   ? Patient Stated Goals Pt would like to be as independent as possible.   ? Currently in Pain? Yes   ? Pain Score 3    ? Pain Location Hand   ? Pain Orientation Right   ? Pain Descriptors / Indicators Aching;Sore   ? Pain Type Chronic pain   ? Pain Onset More than a month ago   ? Pain Frequency Intermittent   ? ?  ?  ? ?  ? ? ? ? ? [place] OT Assessment - 08/19/21 2152   ? ?  ? AROM  ? Right Shoulder Flexion 100 Degrees   ? Right Shoulder ABduction 79 Degrees   ? Right Elbow Flexion  140   ? Right Elbow Extension 0   ? Right Forearm Pronation 85 Degrees   ? Right Forearm Supination 90 Degrees   ? Right Wrist Extension 50 Degrees   ? Right Wrist Flexion 62 Degrees   ? ?  ?  ? ?  ? ? ?Moist heat applied to right shoulder, hand and wrist for 5 mins prior to therapeutic exercise to decrease pain, increase motion and increase tissue mobility. ?  ?Therapeutic Exercises: ?Performed slow, passive stretching for R IF and LF MP, PIP, and DIP flexion, and passive extension stretching for RF and SF at MP and PIP joints to tolerance.  Continued attempts at active digit blocking exercises for RF and LF DIP and PIP joints with only trace movement. ?Reassessment of ROM and strength performed for progress update, please refer to flowsheet above for details.  ? ?Reviewed use of flexion glove again this date, pt continues to require max assist to don glove.  Pt reports she is going  to try to wear the glove this week with focus on index and middle flexion, she will not need to use rubber bands on the ring and small finger.  She will advise her husband on how to get it on but she would also like to work on being able to don herself and be independent with use.  Instructed her to don glove and then keep on while she is watching TV and occasionally pull down and attach the rubber bands in flexion for 3-5 mins at a time and then release but keep glove on for next set.  Pt demonstrates understanding.   ? ?Pt demonstrates increased tightness in right web space and CMC joint, has some arthritic changes and thumb is painful at times with any attempts at ROM.  She is unable to flex at PIP, DIP joints of right index and middle finger actively.  More passive motion in middle finger than index.  Difficulty with grasping items overall and is unable to produce a tip to tip grasp, can grossly grasp with index an middle finger extended and some use of thumb as a support if item is not too large.   ?  ?Response to tx: ?Pt demonstrated progress with shoulder, elbow and forearm on right, wrist remained about the same and still has difficulty with active movement of the index and middle fingers of the right hand.  She has pain in the right hand at [facility] joint of the thumb, and with index and middle finger PIP and DIP passive flexion.  Decreased hand use for grasping and prehension skills.  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  Advised her again to speak with her MD about the use of dysport.  Pt has demonstrated progress with right shoulder, elbow and forearm motion but remains limited with right hand.  She would benefit from continued skilled OT intervention to maximize safety and independence in necessary daily tasks.  Discussed the  importance of using flexion glove over the next few weeks to see if this will impact her right hand as well as discuss with MD her contractures in extension and determine if another round of Dysport is indicated, location and dosage of injections.   ? ? ? ? ? ? ? ? ? ? ? ? ? ? ? ?  OT Education - 08/19/21 2150   ? ? Education Details consistent use of flexion glove, short duration (5-10 min) as tolerated   ? Person(s) Educated Patient   ? Methods Explanation;Verbal cues   ? Comprehension Verbalized understanding;Returned demonstration;Tactile cues required;Verbal cues required;Need further instruction   ? ?  ?  ? ?  ? ? ? OT Short Term Goals - 08/17/21 1355   ? ?  ? OT SHORT TERM GOAL #1  ? Title I with initial HEP.   ? Time 6   ? Period Weeks   ? Status Achieved   ? Target Date 04/13/20   ?  ? OT SHORT TERM GOAL #4  ? Title Pt will demonstrate 60* A/ROM shoulder flexion for RUE with pain less than or equal to 4/10.   ? Baseline Pt. presents with limited isolated  right shoulder flexion with pain continues to be 2. 08/08/20: 40* AROM with 3/10 pain; 03/07/21: Pt demos 75* active shoulder flexion with 2/10 pain or less. 40th visit: Pt. continues to present with limited shoulder ROM, and pain has decreased; 07/04/21 R shoulder active flexion 85, 3/10 pain.   ? Time 4   ? Period Weeks   ? Status On-going   ? Target Date 11/09/2021  ?  ? OT SHORT TERM GOAL #5  ? Title Pt will demonstrate improved LUE fine motor coordination for ADLs as evidenced by decreasing 9 hole peg test score to 25 secs or less.   ? Baseline 10/31/2020: Left FMC: 32 sec. Left FMC 41 sec visa the 9 hole peg test. 08/08/20: 32 seconds via 9 hole peg 40th visit: Left hand: 35 sec.; 07/04/21: L hand 28 sec   ? Time 12   ? Period Weeks   ? Status On-going   ? Target Date 11/09/2021  ?  ? OT SHORT TERM GOAL #7  ? Title Pt will demonstrate at least 25% finger flexion/ extension in RUE in prep for functional use.   ? Baseline Limited PROM, and active digit  flexion, and extension. Improved AROM for right 2nd, and 3rd digits. significant limitations with the 4th, and 5th digits. 08/08/20: AROM digit MCP 2nd (80*), 3rd (85*), 4th (80*), 5th (90*); 2/21/2: Since Botox

## 2021-08-21 NOTE — Addendum Note (Signed)
Addended by: Garlon Hatchet T on: 08/21/2021 09:38 AM ? ? Modules accepted: Orders ? ?

## 2021-08-22 ENCOUNTER — Ambulatory Visit: Payer: Medicare PPO | Admitting: Occupational Therapy

## 2021-08-24 ENCOUNTER — Encounter: Payer: Self-pay | Admitting: Occupational Therapy

## 2021-08-24 ENCOUNTER — Ambulatory Visit: Payer: Medicare PPO | Admitting: Occupational Therapy

## 2021-08-24 DIAGNOSIS — I69159 Hemiplegia and hemiparesis following nontraumatic intracerebral hemorrhage affecting unspecified side: Secondary | ICD-10-CM

## 2021-08-24 DIAGNOSIS — M6281 Muscle weakness (generalized): Secondary | ICD-10-CM

## 2021-08-24 DIAGNOSIS — R278 Other lack of coordination: Secondary | ICD-10-CM

## 2021-08-24 NOTE — Therapy (Signed)
[place] [facility] ?[place], [place], [address] ?Phone: [phone]   Fax:  [phone] ? ?Occupational Therapy Treatment ? ?Patient Details  ?Name: Erika Stanton ?MRN: [id number] ?Date of Birth: Sep 22, 1955 ?No data recorded ? ?Encounter Date: 08/24/2021 ? ? OT End of Session - 08/24/21 1256   ? ? Visit Number 131   ? Number of Visits 148   ? Date for OT Re-Evaluation 11/09/21   ? Authorization Type Progress report period starting 07/06/21   ? Authorization Time Period Medicare   ? OT Start Time 1300   ? OT Stop Time 1344   ? OT Time Calculation (min) 44 min   ? Activity Tolerance Patient tolerated treatment well   ? Behavior During Therapy [facility] for tasks assessed/performed   ? ?  ?  ? ?  ? ? ?Past Medical History:  ?Diagnosis Date  ? Aneurysm ([place])   ? Melena   ? ? ?Past Surgical History:  ?Procedure Laterality Date  ? CHOLECYSTECTOMY    ? COLONOSCOPY  11/2017  ? at [facility]. no recurrent polyps.  suggest repeat surveillance study 11/2022.    ? COLONOSCOPY W/ POLYPECTOMY  06/2014  ? Dr Roxy Manns at [facility].  3 adenomatoous polyps, anal fissure.    ? ESOPHAGOGASTRODUODENOSCOPY (EGD) WITH PROPOFOL N/A 01/27/2020  ? Procedure: ESOPHAGOGASTRODUODENOSCOPY (EGD) WITH PROPOFOL;  Surgeon: Mauri Pole, MD;  Location: [facility];  Service: Endoscopy;  Laterality: N/A;  ? IR 3D INDEPENDENT WKST  01/06/2020  ? IR ANGIO INTRA EXTRACRAN SEL INTERNAL CAROTID BILAT MOD SED  01/06/2020  ? IR ANGIO VERTEBRAL SEL VERTEBRAL UNI L MOD SED  01/06/2020  ? IR ANGIOGRAM FOLLOW UP STUDY  01/06/2020  ? IR ANGIOGRAM FOLLOW UP STUDY  01/06/2020  ? IR ANGIOGRAM FOLLOW UP STUDY  01/06/2020  ? IR ANGIOGRAM FOLLOW UP STUDY  01/06/2020  ? IR ANGIOGRAM FOLLOW UP STUDY  01/06/2020  ? IR ANGIOGRAM FOLLOW UP STUDY  01/06/2020  ? IR ANGIOGRAM FOLLOW UP STUDY  01/06/2020  ? IR ANGIOGRAM FOLLOW UP STUDY  01/06/2020  ? IR ANGIOGRAM FOLLOW UP STUDY  01/06/2020  ? IR ANGIOGRAM FOLLOW UP STUDY  01/06/2020  ? IR  NEURO EACH ADD'L AFTER BASIC UNI RIGHT (MS)  01/06/2020  ? IR TRANSCATH/EMBOLIZ  01/06/2020  ? RADIOLOGY WITH ANESTHESIA N/A 01/06/2020  ? Procedure: IR WITH ANESTHESIA FOR ANEURYSM;  Surgeon: Consuella Lose, MD;  Location: [facility];  Service: Radiology;  Laterality: N/A;  ? TUBAL LIGATION    ? ? ?There were no vitals filed for this visit. ? ? Subjective Assessment - 08/24/21 1255   ? ? Subjective  Reports having increased knee pain lately.   ? Pertinent History ICH 01/06/20, Aneurysm. PMH: OA bilateral knees and hands   ? Patient Stated Goals Pt would like to be as independent as possible.   ? Currently in Pain? Yes   ? Pain Score 3    ? Pain Location Hand   ? Pain Orientation Right   ? Pain Descriptors / Indicators Discomfort   ? Pain Type Chronic pain   ? Pain Onset More than a month ago   ? ?  ?  ? ?  ? ? ? ?Therapeutic Exercise ?Pt tolerated heat prior to and in preparation for therapeutic exercise. Pt tolerated seated PROM in all joint ranges of the R hand and wrist including forearm supination, wrist flexion, extension, and digit MP flexion, and extension. Pt reports wearing glove but  did not bring to session. Wrapped right hand with ace wrap with small ball in palm for promotion of finger flexion, prolonged stretching for multiple trials.  ? ? ?Therapeutic Activity ?Pt completed seated activity grasping balls and 1 inch discs with 3 pt pinch, completes 10 reps of palm sized balls and 20 reps of discs grasping from tabletop and placing in bucket placed at near shoulder height.  ? ? ? ? ? ? ? ? OT Education - 08/24/21 1256   ? ? Education Details consistent use of flexion glove, short duration (5-10 min) as tolerated   ? Person(s) Educated Patient   ? Methods Explanation;Verbal cues   ? Comprehension Verbalized understanding;Returned demonstration;Tactile cues required;Verbal cues required;Need further instruction   ? ?  ?  ? ?  ? ? ? OT Short Term Goals - 07/04/21 1355   ? ?  ? OT SHORT TERM GOAL #1  ? Title I  with initial HEP.   ? Time 6   ? Period Weeks   ? Status Achieved   ? Target Date 04/13/20   ?  ? OT SHORT TERM GOAL #4  ? Title Pt will demonstrate 60* A/ROM shoulder flexion for RUE with pain less than or equal to 4/10.   ? Baseline Pt. presents with limited isolated  right shoulder flexion with pain continues to be 2. 08/08/20: 40* AROM with 3/10 pain; 03/07/21: Pt demos 75* active shoulder flexion with 2/10 pain or less. 40th visit: Pt. continues to present with limited shoulder ROM, and pain has decreased; 07/04/21 R shoulder active flexion 85, 3/10 pain.   ? Time 4   ? Period Weeks   ? Status On-going   ? Target Date 08/21/21   ?  ? OT SHORT TERM GOAL #5  ? Title Pt will demonstrate improved LUE fine motor coordination for ADLs as evidenced by decreasing 9 hole peg test score to 25 secs or less.   ? Baseline 10/31/2020: Left FMC: 32 sec. Left FMC 41 sec visa the 9 hole peg test. 08/08/20: 32 seconds via 9 hole peg 40th visit: Left hand: 35 sec.; 07/04/21: L hand 28 sec   ? Time 12   ? Period Weeks   ? Status On-going   ? Target Date 08/21/21   ?  ? OT SHORT TERM GOAL #7  ? Title Pt will demonstrate at least 25% finger flexion/ extension in RUE in prep for functional use.   ? Baseline Limited PROM, and active digit flexion, and extension. Improved AROM for right 2nd, and 3rd digits. significant limitations with the 4th, and 5th digits. 08/08/20: AROM digit MCP 2nd (80*), 3rd (85*), 4th (80*), 5th (90*); 2/21/2: Since Botox injection in Dec, R hand has had limited active movement; see note for details   ? Time 4   ? Period Weeks   ? Status On-going   ? Target Date 09/05/20   ? ?  ?  ? ?  ? ? ? ? OT Long Term Goals - 07/05/21 1417   ? ?  ? OT LONG TERM GOAL #3  ? Title Pt will use RUE as a stabilizer/ gross A at least 30 % of the time for ADLs/ IADLs.   ? Baseline Pt. is using her right hand as a gross stabilizer 15% of the time. 08/08/20: requires MAX cues; 03/07/21 Pt using R hand as a gross stabilizer at least 25% of  the time. 40th visit: Pt. has difficulty using her right hand  as  a gross stabilizer; 07/04/21: Pt uses RUE as a stabilizer 40-50% of the time for ADLs/IADLs.   ? Time 12   ? Period Weeks   ? Status On-going   ? Target Date 08/21/21   ?  ? OT LONG TERM GOAL #4  ? Title Pt will increase LUE grip strength to 25 lbs or greater for increased ease with ADLs.   ? Baseline 12/13/20: 24# left, 1# right 10/31/2020: Pt. continues to present with limited left grip. Pt. has improved with left grip strength. Pt.continues to work towards 25#. 08/08/20: L grip 19#; 03/07/21: L grip 21 lbs (R 1 lb) 05/18/2021: Left grip strength: 30#; 07/04/21:L grip 21#, R 0#   ? Time 12   ? Period Weeks   ? Status On-going   ? Target Date 08/21/21   ?  ? OT LONG TERM GOAL #5  ? Title 10/31/2020: Pt. is now perfroming laundry, loading the dishwasher, assists some with cooking, light meal preparation, setting the table   ? Baseline 12/13/2020:  continues to assist as needed but uses left hand .  10/13/20: Pt. assists with filling the dishwasher, and performing laundry. Pt. continues to try to help when she can, however her husband mostly performs these tasks. 08/08/20: Husband performs; 03/07/21: Pt assists with laundry, loading dishwasher, light cooking/meal prep; mostly using LUE. 05/18/2021: pt. continues to assist with laundry skills, loading the dishwasher, light cooking using the left hand; 07/04/21: using L hand for above noted tasks   ? Time 12   ? Period Weeks   ? Status On-going   ? Target Date 08/21/21   ?  ? OT LONG TERM GOAL #6  ? Title Pt will demonstrate ability to grasp/ release a cup 2/3 trials with RUE.   ? Baseline 12/13/20:  Continues to work on consistency with grasp and release 10/31/2020: Pt. continues to initiate grasp, and active release of objects, however is unable to grasp/release a cup. 08/08/20: Able to grasp, unable to release x1 trial; 03/05/21: Able to grasp/release cup with min A to keep cup from tipping over.05/18/2021: Pt. is unable  to grasp items secondary to changes in the right hand following Botox injections. Pt. has 2nd,a nd 3rd digit PIP extensor tightness; 07/04/21: continues with extensor tightness in 2nd and 3rd digit, an

## 2021-08-29 ENCOUNTER — Ambulatory Visit: Payer: Medicare PPO | Admitting: Occupational Therapy

## 2021-08-29 ENCOUNTER — Encounter: Payer: Self-pay | Admitting: Occupational Therapy

## 2021-08-29 DIAGNOSIS — M6281 Muscle weakness (generalized): Secondary | ICD-10-CM

## 2021-08-29 DIAGNOSIS — R278 Other lack of coordination: Secondary | ICD-10-CM

## 2021-08-29 NOTE — Therapy (Signed)
[place] ?[facility] ?[place], [place], [address] ?Phone: [phone]   Fax:  [phone] ? ?Occupational Therapy Treatment ? ?Patient Details  ?Name: Erika Stanton ?MRN: [id number] ?Date of Birth: [birth year] ?No data recorded ? ?Encounter Date: 08/29/2021 ? ? OT End of Session - 08/29/21 1802   ? ? Visit Number [id number]   ? Number of Visits 148   ? Date for OT Re-Evaluation 11/09/21   ? Authorization Type Progress report period starting 07/06/21   ? OT Start Time 1300   ? OT Stop Time 1345   ? OT Time Calculation (min) 45 min   ? Activity Tolerance Patient tolerated treatment well   ? Behavior During Therapy [facility] for tasks assessed/performed   ? ?  ?  ? ?  ? ? ?Past Medical History:  ?Diagnosis Date  ? Aneurysm ([place])   ? Melena   ? ? ?Past Surgical History:  ?Procedure Laterality Date  ? CHOLECYSTECTOMY    ? COLONOSCOPY  11/2017  ? at [facility]. no recurrent polyps.  suggest repeat surveillance study 11/2022.    ? COLONOSCOPY W/ POLYPECTOMY  06/2014  ? Dr Roxy Manns at [facility].  3 adenomatoous polyps, anal fissure.    ? ESOPHAGOGASTRODUODENOSCOPY (EGD) WITH PROPOFOL N/A 01/27/2020  ? Procedure: ESOPHAGOGASTRODUODENOSCOPY (EGD) WITH PROPOFOL;  Surgeon: Mauri Pole, MD;  Location: [place] [facility];  Service: Endoscopy;  Laterality: N/A;  ? IR 3D INDEPENDENT WKST  01/06/2020  ? IR ANGIO INTRA EXTRACRAN SEL INTERNAL CAROTID BILAT MOD SED  01/06/2020  ? IR ANGIO VERTEBRAL SEL VERTEBRAL UNI L MOD SED  01/06/2020  ? IR ANGIOGRAM FOLLOW UP STUDY  01/06/2020  ? IR ANGIOGRAM FOLLOW UP STUDY  01/06/2020  ? IR ANGIOGRAM FOLLOW UP STUDY  01/06/2020  ? IR ANGIOGRAM FOLLOW UP STUDY  01/06/2020  ? IR ANGIOGRAM FOLLOW UP STUDY  01/06/2020  ? IR ANGIOGRAM FOLLOW UP STUDY  01/06/2020  ? IR ANGIOGRAM FOLLOW UP STUDY  01/06/2020  ? IR ANGIOGRAM FOLLOW UP STUDY  01/06/2020  ? IR ANGIOGRAM FOLLOW UP STUDY  01/06/2020  ? IR ANGIOGRAM FOLLOW UP STUDY  01/06/2020  ? IR NEURO EACH ADD'L AFTER BASIC UNI RIGHT  (MS)  01/06/2020  ? IR TRANSCATH/EMBOLIZ  01/06/2020  ? RADIOLOGY WITH ANESTHESIA N/A 01/06/2020  ? Procedure: IR WITH ANESTHESIA FOR ANEURYSM;  Surgeon: Consuella Lose, MD;  Location: [place];  Service: Radiology;  Laterality: N/A;  ? TUBAL LIGATION    ? ? ?There were no vitals filed for this visit. ? ? Subjective Assessment - 08/29/21 1311   ? ? Subjective  Reports wearing her digit flexion glove for 5-10 min. 2-3 x's while watching a movie.   ? Patient is accompanied by: Family member   ? Currently in Pain? Yes   ? Pain Score 3    ? Pain Location Hand   ? Pain Orientation Right   ? Pain Descriptors / Indicators Discomfort   ? ?  ?  ? ?  ? ? ?OT TREATMENT   ? ? Manual therapy; ? ?Pt. tolerated soft tissue massage to the volar, and dorsal aspect of the right 2nd, and 3rd digit MPs, PIP, and DIPs.  Manual therapy was performed independent of, and in preparation for there. Ex.  ? ?Therapeutic Exercise: ? ?Pt. tolerated PROM to the right 2nd, and 3rd digits to increase digit MP, PIP, and DIP flexion in preparation for formulating a fist.  ? ?Pt. presents with 3/10 pain  intermittently in the right hand during ROM when performing digit MP, PIP, and DIP flexion together in each digit (2nd or 3rd digit). Pt. Reports no discomfort when performing ROM to each digit joint individually. Pt. Tolerated Manual therapy well prior to ROM. Pt. Continues to work on improving pain, ROM with right 2nd, and 3rd digit MP, PIP, and DIP flexion , and overall RUE functioning to improve engagement in daily ADL, and IADL tasks ? ? ? ? ? ? ? ? ? ? ? ? ? ? ? ? ? ? OT Education - 08/29/21 1802   ? ? Education Details consistent use of flexion glove, short duration (5-10 min) as tolerated 2-3x's duringa movie.   ? Person(s) Educated Patient   ? Comprehension Verbalized understanding;Returned demonstration;Tactile cues required;Verbal cues required;Need further instruction   ? ?  ?  ? ?  ? ? ? OT Short Term Goals - 07/04/21 1355   ? ?  ? OT SHORT  TERM GOAL #1  ? Title I with initial HEP.   ? Time 6   ? Period Weeks   ? Status Achieved   ? Target Date 04/13/20   ?  ? OT SHORT TERM GOAL #4  ? Title Pt will demonstrate 60* A/ROM shoulder flexion for RUE with pain less than or equal to 4/10.   ? Baseline Pt. presents with limited isolated  right shoulder flexion with pain continues to be 2. 08/08/20: 40* AROM with 3/10 pain; 03/07/21: Pt demos 75* active shoulder flexion with 2/10 pain or less. 40th visit: Pt. continues to present with limited shoulder ROM, and pain has decreased; 07/04/21 R shoulder active flexion 85, 3/10 pain.   ? Time 4   ? Period Weeks   ? Status On-going   ? Target Date 08/21/21   ?  ? OT SHORT TERM GOAL #5  ? Title Pt will demonstrate improved LUE fine motor coordination for ADLs as evidenced by decreasing 9 hole peg test score to 25 secs or less.   ? Baseline 10/31/2020: Left FMC: 32 sec. Left FMC 41 sec visa the 9 hole peg test. 08/08/20: 32 seconds via 9 hole peg 40th visit: Left hand: 35 sec.; 07/04/21: L hand 28 sec   ? Time 12   ? Period Weeks   ? Status On-going   ? Target Date 08/21/21   ?  ? OT SHORT TERM GOAL #7  ? Title Pt will demonstrate at least 25% finger flexion/ extension in RUE in prep for functional use.   ? Baseline Limited PROM, and active digit flexion, and extension. Improved AROM for right 2nd, and 3rd digits. significant limitations with the 4th, and 5th digits. 08/08/20: AROM digit MCP 2nd (80*), 3rd (85*), 4th (80*), 5th (90*); 2/21/2: Since Botox injection in Dec, R hand has had limited active movement; see note for details   ? Time 4   ? Period Weeks   ? Status On-going   ? Target Date 09/05/20   ? ?  ?  ? ?  ? ? ? ? OT Long Term Goals - 07/05/21 1417   ? ?  ? OT LONG TERM GOAL #3  ? Title Pt will use RUE as a stabilizer/ gross A at least 30 % of the time for ADLs/ IADLs.   ? Baseline Pt. is using her right hand as a gross stabilizer 15% of the time. 08/08/20: requires MAX cues; 03/07/21 Pt using R hand as a gross  stabilizer at least 25% of  the time. 40th visit: Pt. has difficulty using her right hand  as a gross stabilizer; 07/04/21: Pt uses RUE as a stabilizer 40-50% of the time for ADLs/IADLs.   ? Time 12   ? Period Weeks   ? Status On-going   ? Target Date 08/21/21   ?  ? OT LONG TERM GOAL #4  ? Title Pt will increase LUE grip strength to 25 lbs or greater for increased ease with ADLs.   ? Baseline 12/13/20: 24# left, 1# right 10/31/2020: Pt. continues to present with limited left grip. Pt. has improved with left grip strength. Pt.continues to work towards 25#. 08/08/20: L grip 19#; 03/07/21: L grip 21 lbs (R 1 lb) 05/18/2021: Left grip strength: 30#; 07/04/21:L grip 21#, R 0#   ? Time 12   ? Period Weeks   ? Status On-going   ? Target Date 08/21/21   ?  ? OT LONG TERM GOAL #5  ? Title 10/31/2020: Pt. is now perfroming laundry, loading the dishwasher, assists some with cooking, light meal preparation, setting the table   ? Baseline 12/13/2020:  continues to assist as needed but uses left hand .  10/13/20: Pt. assists with filling the dishwasher, and performing laundry. Pt. continues to try to help when she can, however her husband mostly performs these tasks. 08/08/20: Husband performs; 03/07/21: Pt assists with laundry, loading dishwasher, light cooking/meal prep; mostly using LUE. 05/18/2021: pt. continues to assist with laundry skills, loading the dishwasher, light cooking using the left hand; 07/04/21: using L hand for above noted tasks   ? Time 12   ? Period Weeks   ? Status On-going   ? Target Date 08/21/21   ?  ? OT LONG TERM GOAL #6  ? Title Pt will demonstrate ability to grasp/ release a cup 2/3 trials with RUE.   ? Baseline 12/13/20:  Continues to work on consistency with grasp and release 10/31/2020: Pt. continues to initiate grasp, and active release of objects, however is unable to grasp/release a cup. 08/08/20: Able to grasp, unable to release x1 trial; 03/05/21: Able to grasp/release cup with min A to keep cup from tipping  over.05/18/2021: Pt. is unable to grasp items secondary to changes in the right hand following Botox injections. Pt. has 2nd,a nd 3rd digit PIP extensor tightness; 07/04/21: continues with extensor tightness

## 2021-08-31 ENCOUNTER — Ambulatory Visit: Payer: Medicare PPO | Admitting: Occupational Therapy

## 2021-08-31 ENCOUNTER — Encounter: Payer: Self-pay | Admitting: Occupational Therapy

## 2021-08-31 DIAGNOSIS — M6281 Muscle weakness (generalized): Secondary | ICD-10-CM

## 2021-08-31 DIAGNOSIS — R278 Other lack of coordination: Secondary | ICD-10-CM

## 2021-08-31 NOTE — Therapy (Signed)
[place] ?[facility] ?[place], [place], [address] ?Phone: [phone]   Fax:  [phone] ? ?Occupational Therapy Treatment ? ?Patient Details  ?Name: Erika Stanton ?MRN: [id number] ?Date of Birth: Nov 08, 1955 ?No data recorded ? ?Encounter Date: 08/31/2021 ? ? OT End of Session - 08/31/21 1444   ? ? Visit Number 133   ? Number of Visits 148   ? Date for OT Re-Evaluation 11/09/21   ? Authorization Type Progress report period starting 07/06/21   ? Authorization Time Period Medicare   ? OT Start Time 1300   ? OT Stop Time 1345   ? OT Time Calculation (min) 45 min   ? Activity Tolerance Patient tolerated treatment well   ? Behavior During Therapy [facility] for tasks assessed/performed   ? ?  ?  ? ?  ? ? ?Past Medical History:  ?Diagnosis Date  ? Aneurysm ([place])   ? Melena   ? ? ?Past Surgical History:  ?Procedure Laterality Date  ? CHOLECYSTECTOMY    ? COLONOSCOPY  11/2017  ? at [facility]. no recurrent polyps.  suggest repeat surveillance study 11/2022.    ? COLONOSCOPY W/ POLYPECTOMY  06/2014  ? Dr Roxy Manns at [facility].  3 adenomatoous polyps, anal fissure.    ? ESOPHAGOGASTRODUODENOSCOPY (EGD) WITH PROPOFOL N/A 01/27/2020  ? Procedure: ESOPHAGOGASTRODUODENOSCOPY (EGD) WITH PROPOFOL;  Surgeon: Mauri Pole, MD;  Location: [place] [facility];  Service: Endoscopy;  Laterality: N/A;  ? IR 3D INDEPENDENT WKST  01/06/2020  ? IR ANGIO INTRA EXTRACRAN SEL INTERNAL CAROTID BILAT MOD SED  01/06/2020  ? IR ANGIO VERTEBRAL SEL VERTEBRAL UNI L MOD SED  01/06/2020  ? IR ANGIOGRAM FOLLOW UP STUDY  01/06/2020  ? IR ANGIOGRAM FOLLOW UP STUDY  01/06/2020  ? IR ANGIOGRAM FOLLOW UP STUDY  01/06/2020  ? IR ANGIOGRAM FOLLOW UP STUDY  01/06/2020  ? IR ANGIOGRAM FOLLOW UP STUDY  01/06/2020  ? IR ANGIOGRAM FOLLOW UP STUDY  01/06/2020  ? IR ANGIOGRAM FOLLOW UP STUDY  01/06/2020  ? IR ANGIOGRAM FOLLOW UP STUDY  01/06/2020  ? IR ANGIOGRAM FOLLOW UP STUDY  01/06/2020  ? IR ANGIOGRAM FOLLOW UP STUDY  01/06/2020  ? IR  NEURO EACH ADD'L AFTER BASIC UNI RIGHT (MS)  01/06/2020  ? IR TRANSCATH/EMBOLIZ  01/06/2020  ? RADIOLOGY WITH ANESTHESIA N/A 01/06/2020  ? Procedure: IR WITH ANESTHESIA FOR ANEURYSM;  Surgeon: Consuella Lose, MD;  Location: [place];  Service: Radiology;  Laterality: N/A;  ? TUBAL LIGATION    ? ? ?There were no vitals filed for this visit. ? ? Subjective Assessment - 08/31/21 1444   ? ? Subjective  Reports wearing her digit flexion glove for 5-10 min. 2-3 x's while watching a movie.   ? Patient is accompanied by: Family member   ? Pertinent History ICH 01/06/20, Aneurysm. PMH: OA bilateral knees and hands   ? Currently in Pain? No/denies   ? ?  ?  ? ?  ? ?OT TREATMENT   ?  ?Manual therapy; ?  ?Pt. tolerated soft tissue massage to the volar, and dorsal aspect of the right 2nd, and 3rd digit MPs, PIP, and DIPs.  Manual therapy was performed independent of, and in preparation for there. Ex.  ?  ?Therapeutic Exercise: ?  ?Pt. tolerated PROM to the right 2nd, and 3rd digits to increase digit MP, PIP, and DIP flexion in preparation for formulating a fist.  ? ?Neuromuscular re-education: ? ?Pt. worked on right hand [facility]  skills. Grasping 1 &1/2" [place] style discs, moving them from the container, and reaching out to place them into at the tabletop surface.  ?  ?Pt. reports no pain today. Pt. tolerated manual therapy well prior to ROM, and tolerated ROM with emphasis placed on PROM for right 2nd, and 3rd digit MP, PIP, and DIP flexion 2/2 digit extensor tightness, and 4th & 5th digit extension 2/2 flexor tightness. Pt. continues to work on improving ROM with right 2nd, and 3rd digit MP, PIP, and DIP flexion, and overall RUE functioning to improve engagement in daily ADL, and IADL tasks. Pt. Continues to work on improving RUE functioning in order to improve RUE functional reaching, and functional grasp patterns.   ? ? ? ? ? ? ? ? ? ? ? ? ? ? ? ? ? ? ? ? ? ? OT Education - 08/31/21 1444   ? ? Education Details consistent use of  flexion glove, short duration (5-10 min) as tolerated 2-3x's duringa movie.   ? Person(s) Educated Patient   ? Methods Explanation;Verbal cues   ? Comprehension Verbalized understanding;Returned demonstration;Tactile cues required;Verbal cues required;Need further instruction   ? ?  ?  ? ?  ? ? ? OT Short Term Goals - 07/04/21 1355   ? ?  ? OT SHORT TERM GOAL #1  ? Title I with initial HEP.   ? Time 6   ? Period Weeks   ? Status Achieved   ? Target Date 04/13/20   ?  ? OT SHORT TERM GOAL #4  ? Title Pt will demonstrate 60* A/ROM shoulder flexion for RUE with pain less than or equal to 4/10.   ? Baseline Pt. presents with limited isolated  right shoulder flexion with pain continues to be 2. 08/08/20: 40* AROM with 3/10 pain; 03/07/21: Pt demos 75* active shoulder flexion with 2/10 pain or less. 40th visit: Pt. continues to present with limited shoulder ROM, and pain has decreased; 07/04/21 R shoulder active flexion 85, 3/10 pain.   ? Time 4   ? Period Weeks   ? Status On-going   ? Target Date 08/21/21   ?  ? OT SHORT TERM GOAL #5  ? Title Pt will demonstrate improved LUE fine motor coordination for ADLs as evidenced by decreasing 9 hole peg test score to 25 secs or less.   ? Baseline 10/31/2020: Left FMC: 32 sec. Left FMC 41 sec visa the 9 hole peg test. 08/08/20: 32 seconds via 9 hole peg 40th visit: Left hand: 35 sec.; 07/04/21: L hand 28 sec   ? Time 12   ? Period Weeks   ? Status On-going   ? Target Date 08/21/21   ?  ? OT SHORT TERM GOAL #7  ? Title Pt will demonstrate at least 25% finger flexion/ extension in RUE in prep for functional use.   ? Baseline Limited PROM, and active digit flexion, and extension. Improved AROM for right 2nd, and 3rd digits. significant limitations with the 4th, and 5th digits. 08/08/20: AROM digit MCP 2nd (80*), 3rd (85*), 4th (80*), 5th (90*); 2/21/2: Since Botox injection in Dec, R hand has had limited active movement; see note for details   ? Time 4   ? Period Weeks   ? Status On-going    ? Target Date 09/05/20   ? ?  ?  ? ?  ? ? ? ? OT Long Term Goals - 07/05/21 1417   ? ?  ? OT LONG TERM GOAL #3  ?  Title Pt will use RUE as a stabilizer/ gross A at least 30 % of the time for ADLs/ IADLs.   ? Baseline Pt. is using her right hand as a gross stabilizer 15% of the time. 08/08/20: requires MAX cues; 03/07/21 Pt using R hand as a gross stabilizer at least 25% of the time. 40th visit: Pt. has difficulty using her right hand  as a gross stabilizer; 07/04/21: Pt uses RUE as a stabilizer 40-50% of the time for ADLs/IADLs.   ? Time 12   ? Period Weeks   ? Status On-going   ? Target Date 08/21/21   ?  ? OT LONG TERM GOAL #4  ? Title Pt will increase LUE grip strength to 25 lbs or greater for increased ease with ADLs.   ? Baseline 12/13/20: 24# left, 1# right 10/31/2020: Pt. continues to present with limited left grip. Pt. has improved with left grip strength. Pt.continues to work towards 25#. 08/08/20: L grip 19#; 03/07/21: L grip 21 lbs (R 1 lb) 05/18/2021: Left grip strength: 30#; 07/04/21:L grip 21#, R 0#   ? Time 12   ? Period Weeks   ? Status On-going   ? Target Date 08/21/21   ?  ? OT LONG TERM GOAL #5  ? Title 10/31/2020: Pt. is now perfroming laundry, loading the dishwasher, assists some with cooking, light meal preparation, setting the table   ? Baseline 12/13/2020:  continues to assist as needed but uses left hand .  10/13/20: Pt. assists with filling the dishwasher, and performing laundry. Pt. continues to try to help when she can, however her husband mostly performs these tasks. 08/08/20: Husband performs; 03/07/21: Pt assists with laundry, loading dishwasher, light cooking/meal prep; mostly using LUE. 05/18/2021: pt. continues to assist with laundry skills, loading the dishwasher, light cooking using the left hand; 07/04/21: using L hand for above noted tasks   ? Time 12   ? Period Weeks   ? Status On-going   ? Target Date 08/21/21   ?  ? OT LONG TERM GOAL #6  ? Title Pt will demonstrate ability to grasp/ release  a cup 2/3 trials with RUE.   ? Baseline 12/13/20:  Continues to work on consistency with grasp and release 10/31/2020: Pt. continues to initiate grasp, and active release of objects, however is unable

## 2021-09-05 ENCOUNTER — Ambulatory Visit: Payer: Medicare PPO | Admitting: Occupational Therapy

## 2021-09-05 ENCOUNTER — Encounter: Payer: Self-pay | Admitting: Occupational Therapy

## 2021-09-05 DIAGNOSIS — M6281 Muscle weakness (generalized): Secondary | ICD-10-CM

## 2021-09-05 DIAGNOSIS — R278 Other lack of coordination: Secondary | ICD-10-CM

## 2021-09-05 DIAGNOSIS — M79641 Pain in right hand: Secondary | ICD-10-CM

## 2021-09-05 DIAGNOSIS — I69159 Hemiplegia and hemiparesis following nontraumatic intracerebral hemorrhage affecting unspecified side: Secondary | ICD-10-CM

## 2021-09-05 NOTE — Therapy (Signed)
[place] [facility] ?[place], [place], [address] ?Phone: [phone]   Fax:  [phone] ? ?Occupational Therapy Treatment ? ?Patient Details  ?Name: Erika Stanton ?MRN: [id number] ?Date of Birth: 03/11/1956 ?No data recorded ? ?Encounter Date: 09/05/2021 ? ? OT End of Session - 09/05/21 1619   ? ? Visit Number 134   ? Number of Visits 148   ? Date for OT Re-Evaluation 11/09/21   ? Authorization Type Progress report period starting 07/06/21   ? Authorization Time Period Medicare   ? OT Start Time 1301   ? OT Stop Time 1350   ? OT Time Calculation (min) 49 min   ? Activity Tolerance Patient tolerated treatment well   ? Behavior During Therapy [facility] for tasks assessed/performed   ? ?  ?  ? ?  ? ? ?Past Medical History:  ?Diagnosis Date  ? Aneurysm ([place])   ? Melena   ? ? ?Past Surgical History:  ?Procedure Laterality Date  ? CHOLECYSTECTOMY    ? COLONOSCOPY  11/2017  ? at [facility]. no recurrent polyps.  suggest repeat surveillance study 11/2022.    ? COLONOSCOPY W/ POLYPECTOMY  06/2014  ? Dr Roxy Manns at [facility].  3 adenomatoous polyps, anal fissure.    ? ESOPHAGOGASTRODUODENOSCOPY (EGD) WITH PROPOFOL N/A 01/27/2020  ? Procedure: ESOPHAGOGASTRODUODENOSCOPY (EGD) WITH PROPOFOL;  Surgeon: Mauri Pole, MD;  Location: [facility];  Service: Endoscopy;  Laterality: N/A;  ? IR 3D INDEPENDENT WKST  01/06/2020  ? IR ANGIO INTRA EXTRACRAN SEL INTERNAL CAROTID BILAT MOD SED  01/06/2020  ? IR ANGIO VERTEBRAL SEL VERTEBRAL UNI L MOD SED  01/06/2020  ? IR ANGIOGRAM FOLLOW UP STUDY  01/06/2020  ? IR ANGIOGRAM FOLLOW UP STUDY  01/06/2020  ? IR ANGIOGRAM FOLLOW UP STUDY  01/06/2020  ? IR ANGIOGRAM FOLLOW UP STUDY  01/06/2020  ? IR ANGIOGRAM FOLLOW UP STUDY  01/06/2020  ? IR ANGIOGRAM FOLLOW UP STUDY  01/06/2020  ? IR ANGIOGRAM FOLLOW UP STUDY  01/06/2020  ? IR ANGIOGRAM FOLLOW UP STUDY  01/06/2020  ? IR ANGIOGRAM FOLLOW UP STUDY  01/06/2020  ? IR ANGIOGRAM FOLLOW UP STUDY  01/06/2020  ? IR  NEURO EACH ADD'L AFTER BASIC UNI RIGHT (MS)  01/06/2020  ? IR TRANSCATH/EMBOLIZ  01/06/2020  ? RADIOLOGY WITH ANESTHESIA N/A 01/06/2020  ? Procedure: IR WITH ANESTHESIA FOR ANEURYSM;  Surgeon: Consuella Lose, MD;  Location: [facility];  Service: Radiology;  Laterality: N/A;  ? TUBAL LIGATION    ? ? ?There were no vitals filed for this visit. ? ? Subjective Assessment - 09/05/21 1617   ? ? Subjective  Pt reports her husband still thinks it is difficult to get the flexion glove on but still doing it on a daily basis for a few times a day.   ? Pertinent History ICH 01/06/20, Aneurysm. PMH: OA bilateral knees and hands   ? Patient Stated Goals Pt would like to be as independent as possible.   ? Currently in Pain? Yes   ? Pain Score 3    ? Pain Location Hand   ? Pain Orientation Right   ? Pain Descriptors / Indicators Discomfort;Aching   ? Pain Type Chronic pain   ? Pain Onset More than a month ago   ? ?  ?  ? ?  ? ? ?Moist heat applied to right shoulder, hand and wrist for 5 mins prior to therapeutic exercise to decrease pain, increase motion and  increase tissue mobility. ?  ?Therapeutic Exercises: ?Performed slow, passive stretching for R IF and LF MP, PIP, and DIP flexion, and passive extension stretching for RF and SF at MP and PIP joints to tolerance.  Continued attempts at active digit blocking exercises for RF and LF DIP and PIP joints with only trace movement. ?Reaching activity with right shoulder with AAROM within pain tolerances and in multi directional planes of motion.   ?Pt continues to wear flexion glove at home, husband is assisting her to don and doff, reports some continued difficulty but gets it on a few times a day and uses it while watching TV.   ?Continued difficulty with grasp and release of objects depending on shape and size of object.   ? ?Response to tx: ?Pt reports pain at times with passive stretching, she states she probably does not push as much at home as therapist does.  Reports pain as 3/10.   Pt has flexion glove and reports using it daily, she requires assist from her husband to don.  Pt continues to demonstrate increased tightness especially in index PIP with any flexion attempts, Middle/long finger also remains in extension but able to achieve greater flexion of PIP joints than index.  Pt continues to demonstrate decreased movement of right hand for functional tasks.  She is able to grasp and release some objects depending on size and shape but her grasping pattern is modified and not a functional grasp at this point.  Index and middle fingers remain in extension, middle finger with hyperextension at PIP joint.  Ring and small fingers tend to stay in flexion.  Continue towards goals in plan of care, will consider trying a ring splint for hyperextension of middle finger at PIP.   ? ? ? ? ? ? ? ? ? ? ? ? ? ? ? ? ? ? ? ? OT Education - 09/05/21 1619   ? ? Education Details consistent use of flexion glove and ROM at home   ? Person(s) Educated Patient   ? Methods Explanation;Verbal cues   ? Comprehension Verbalized understanding;Returned demonstration;Tactile cues required;Verbal cues required;Need further instruction   ? ?  ?  ? ?  ? ? ? OT Short Term Goals - 07/04/21 1355   ? ?  ? OT SHORT TERM GOAL #1  ? Title I with initial HEP.   ? Time 6   ? Period Weeks   ? Status Achieved   ? Target Date 04/13/20   ?  ? OT SHORT TERM GOAL #4  ? Title Pt will demonstrate 60* A/ROM shoulder flexion for RUE with pain less than or equal to 4/10.   ? Baseline Pt. presents with limited isolated  right shoulder flexion with pain continues to be 2. 08/08/20: 40* AROM with 3/10 pain; 03/07/21: Pt demos 75* active shoulder flexion with 2/10 pain or less. 40th visit: Pt. continues to present with limited shoulder ROM, and pain has decreased; 07/04/21 R shoulder active flexion 85, 3/10 pain.   ? Time 4   ? Period Weeks   ? Status On-going   ? Target Date 08/21/21   ?  ? OT SHORT TERM GOAL #5  ? Title Pt will demonstrate  improved LUE fine motor coordination for ADLs as evidenced by decreasing 9 hole peg test score to 25 secs or less.   ? Baseline 10/31/2020: Left FMC: 32 sec. Left FMC 41 sec visa the 9 hole peg test. 08/08/20: 32 seconds via 9 hole peg 40th  visit: Left hand: 35 sec.; 07/04/21: L hand 28 sec   ? Time 12   ? Period Weeks   ? Status On-going   ? Target Date 08/21/21   ?  ? OT SHORT TERM GOAL #7  ? Title Pt will demonstrate at least 25% finger flexion/ extension in RUE in prep for functional use.   ? Baseline Limited PROM, and active digit flexion, and extension. Improved AROM for right 2nd, and 3rd digits. significant limitations with the 4th, and 5th digits. 08/08/20: AROM digit MCP 2nd (80*), 3rd (85*), 4th (80*), 5th (90*); 2/21/2: Since Botox injection in Dec, R hand has had limited active movement; see note for details   ? Time 4   ? Period Weeks   ? Status On-going   ? Target Date 09/05/20   ? ?  ?  ? ?  ? ? ? ? OT Long Term Goals - 07/05/21 1417   ? ?  ? OT LONG TERM GOAL #3  ? Title Pt will use RUE as a stabilizer/ gross A at least 30 % of the time for ADLs/ IADLs.   ? Baseline Pt. is using her right hand as a gross stabilizer 15% of the time. 08/08/20: requires MAX cues; 03/07/21 Pt using R hand as a gross stabilizer at least 25% of the time. 40th visit: Pt. has difficulty using her right hand  as a gross stabilizer; 07/04/21: Pt uses RUE as a stabilizer 40-50% of the time for ADLs/IADLs.   ? Time 12   ? Period Weeks   ? Status On-going   ? Target Date 08/21/21   ?  ? OT LONG TERM GOAL #4  ? Title Pt will increase LUE grip strength to 25 lbs or greater for increased ease with ADLs.   ? Baseline 12/13/20: 24# left, 1# right 10/31/2020: Pt. continues to present with limited left grip. Pt. has improved with left grip strength. Pt.continues to work towards 25#. 08/08/20: L grip 19#; 03/07/21: L grip 21 lbs (R 1 lb) 05/18/2021: Left grip strength: 30#; 07/04/21:L grip 21#, R 0#   ? Time 12   ? Period Weeks   ? Status On-going    ? Target Date 08/21/21   ?  ? OT LONG TERM GOAL #5  ? Title 10/31/2020: Pt. is now perfroming laundry, loading the dishwasher, assists some with cooking, light meal preparation, setting the table   ? [place]

## 2021-09-07 ENCOUNTER — Ambulatory Visit: Payer: Medicare PPO | Admitting: Occupational Therapy

## 2021-09-07 DIAGNOSIS — I69159 Hemiplegia and hemiparesis following nontraumatic intracerebral hemorrhage affecting unspecified side: Secondary | ICD-10-CM

## 2021-09-07 DIAGNOSIS — M6281 Muscle weakness (generalized): Secondary | ICD-10-CM | POA: Diagnosis not present

## 2021-09-07 DIAGNOSIS — R278 Other lack of coordination: Secondary | ICD-10-CM

## 2021-09-07 DIAGNOSIS — M79641 Pain in right hand: Secondary | ICD-10-CM

## 2021-09-10 ENCOUNTER — Encounter: Payer: Self-pay | Admitting: Occupational Therapy

## 2021-09-10 NOTE — Therapy (Signed)
[place] ?[facility] ?[place], [place], [address] ?Phone: [phone]   Fax:  [phone] ? ?Occupational Therapy Treatment ? ?Patient Details  ?Name: Erika Stanton ?MRN: [id number] ?Date of Birth: 1956/05/08 ?No data recorded ? ?Encounter Date: 09/07/2021 ? ? OT End of Session - 09/10/21 2051   ? ? Visit Number 135   ? Number of Visits 148   ? Date for OT Re-Evaluation 11/09/21   ? Authorization Type Progress report period starting 07/06/21   ? Authorization Time Period Medicare   ? OT Start Time 1300   ? OT Stop Time 1345   ? OT Time Calculation (min) 45 min   ? Activity Tolerance Patient tolerated treatment well   ? Behavior During Therapy [facility] for tasks assessed/performed   ? ?  ?  ? ?  ? ? ?Past Medical History:  ?Diagnosis Date  ? Aneurysm ([place])   ? Melena   ? ? ?Past Surgical History:  ?Procedure Laterality Date  ? CHOLECYSTECTOMY    ? COLONOSCOPY  11/2017  ? at [facility]. no recurrent polyps.  suggest repeat surveillance study 11/2022.    ? COLONOSCOPY W/ POLYPECTOMY  06/2014  ? Dr Roxy Manns at [facility].  3 adenomatoous polyps, anal fissure.    ? ESOPHAGOGASTRODUODENOSCOPY (EGD) WITH PROPOFOL N/A 01/27/2020  ? Procedure: ESOPHAGOGASTRODUODENOSCOPY (EGD) WITH PROPOFOL;  Surgeon: Mauri Pole, MD;  Location: [facility];  Service: Endoscopy;  Laterality: N/A;  ? IR 3D INDEPENDENT WKST  01/06/2020  ? IR ANGIO INTRA EXTRACRAN SEL INTERNAL CAROTID BILAT MOD SED  01/06/2020  ? IR ANGIO VERTEBRAL SEL VERTEBRAL UNI L MOD SED  01/06/2020  ? IR ANGIOGRAM FOLLOW UP STUDY  01/06/2020  ? IR ANGIOGRAM FOLLOW UP STUDY  01/06/2020  ? IR ANGIOGRAM FOLLOW UP STUDY  01/06/2020  ? IR ANGIOGRAM FOLLOW UP STUDY  01/06/2020  ? IR ANGIOGRAM FOLLOW UP STUDY  01/06/2020  ? IR ANGIOGRAM FOLLOW UP STUDY  01/06/2020  ? IR ANGIOGRAM FOLLOW UP STUDY  01/06/2020  ? IR ANGIOGRAM FOLLOW UP STUDY  01/06/2020  ? IR ANGIOGRAM FOLLOW UP STUDY  01/06/2020  ? IR ANGIOGRAM FOLLOW UP STUDY  01/06/2020  ? IR  NEURO EACH ADD'L AFTER BASIC UNI RIGHT (MS)  01/06/2020  ? IR TRANSCATH/EMBOLIZ  01/06/2020  ? RADIOLOGY WITH ANESTHESIA N/A 01/06/2020  ? Procedure: IR WITH ANESTHESIA FOR ANEURYSM;  Surgeon: Consuella Lose, MD;  Location: [facility];  Service: Radiology;  Laterality: N/A;  ? TUBAL LIGATION    ? ? ?There were no vitals filed for this visit. ? ? Subjective Assessment - 09/10/21 2050   ? ? Subjective  Pt reports she is doing well this week.   ? Pertinent History ICH 01/06/20, Aneurysm. PMH: OA bilateral knees and hands   ? Patient Stated Goals Pt would like to be as independent as possible.   ? Currently in Pain? Yes   ? Pain Score 3    ? Pain Location Hand   ? Pain Orientation Right   ? Pain Descriptors / Indicators Aching;Discomfort   ? Pain Type Chronic pain   ? Pain Onset More than a month ago   ? Pain Frequency Intermittent   ? ?  ?  ? ?  ? ?Moist heat applied to right shoulder, hand and wrist for 5 mins prior to therapeutic exercise to decrease pain, increase motion and increase tissue mobility. ?  ?Therapeutic Exercises: ?Performed slow, passive stretching for R IF and LF  MP, PIP, and DIP flexion, and passive extension stretching for RF and SF at MP and PIP joints to tolerance.  Continued attempts at active digit blocking exercises for RF and LF DIP and PIP joints with only trace movement. ?Reaching activity with right shoulder with AAROM within pain tolerances and in multi directional planes of motion.   ?Pt performing reaching tasks with gross grasp and release with assist from therapist to grasp looped SAEBO balls and place on first 2 levels from seated position.  To complete the 3rd and 4th levels, pt required to stand to reach these levels with right UE.  Difficulty with release of looped balls and requires assist at times for thumb ABD.  ?Gross grasp and release of [place] discs, lateral pinch utilized with assist.  ?Pt continues to wear flexion glove at home but still requires assist to don.  ? ?Response  to tx:  ?Pt engaged in repetitive reaching tasks this date and doing well with shoulder ROM.  Remains limited with right hand grasping patterns to pick up and place items in a variety of planes of motion from sitting and standing positions.  Right index and middle fingers with extensor contracture and has pain with attempts to increase flexion, will attempt to fit middle finger with figure 8 splint next week.  Continue to work towards goals to improve ROM, strength and working towards functional use of RUE in daily tasks.   ? ? ? ? ? ? ? ? ? ? ? ? ? ? ? ? ? ? ? ? ? OT Education - 09/10/21 2051   ? ? Education Details consistent use of flexion glove and ROM at home   ? Person(s) Educated Patient   ? Methods Explanation;Verbal cues   ? Comprehension Verbalized understanding;Returned demonstration;Tactile cues required;Verbal cues required;Need further instruction   ? ?  ?  ? ?  ? ? ? OT Short Term Goals - 07/04/21 1355   ? ?  ? OT SHORT TERM GOAL #1  ? Title I with initial HEP.   ? Time 6   ? Period Weeks   ? Status Achieved   ? Target Date 04/13/20   ?  ? OT SHORT TERM GOAL #4  ? Title Pt will demonstrate 60* A/ROM shoulder flexion for RUE with pain less than or equal to 4/10.   ? Baseline Pt. presents with limited isolated  right shoulder flexion with pain continues to be 2. 08/08/20: 40* AROM with 3/10 pain; 03/07/21: Pt demos 75* active shoulder flexion with 2/10 pain or less. 40th visit: Pt. continues to present with limited shoulder ROM, and pain has decreased; 07/04/21 R shoulder active flexion 85, 3/10 pain.   ? Time 4   ? Period Weeks   ? Status On-going   ? Target Date 08/21/21   ?  ? OT SHORT TERM GOAL #5  ? Title Pt will demonstrate improved LUE fine motor coordination for ADLs as evidenced by decreasing 9 hole peg test score to 25 secs or less.   ? Baseline 10/31/2020: Left FMC: 32 sec. Left FMC 41 sec visa the 9 hole peg test. 08/08/20: 32 seconds via 9 hole peg 40th visit: Left hand: 35 sec.; 07/04/21: L  hand 28 sec   ? Time 12   ? Period Weeks   ? Status On-going   ? Target Date 08/21/21   ?  ? OT SHORT TERM GOAL #7  ? Title Pt will demonstrate at least 25% finger flexion/ extension in  RUE in prep for functional use.   ? Baseline Limited PROM, and active digit flexion, and extension. Improved AROM for right 2nd, and 3rd digits. significant limitations with the 4th, and 5th digits. 08/08/20: AROM digit MCP 2nd (80*), 3rd (85*), 4th (80*), 5th (90*); 2/21/2: Since Botox injection in Dec, R hand has had limited active movement; see note for details   ? Time 4   ? Period Weeks   ? Status On-going   ? Target Date 09/05/20   ? ?  ?  ? ?  ? ? ? ? OT Long Term Goals - 07/05/21 1417   ? ?  ? OT LONG TERM GOAL #3  ? Title Pt will use RUE as a stabilizer/ gross A at least 30 % of the time for ADLs/ IADLs.   ? Baseline Pt. is using her right hand as a gross stabilizer 15% of the time. 08/08/20: requires MAX cues; 03/07/21 Pt using R hand as a gross stabilizer at least 25% of the time. 40th visit: Pt. has difficulty using her right hand  as a gross stabilizer; 07/04/21: Pt uses RUE as a stabilizer 40-50% of the time for ADLs/IADLs.   ? Time 12   ? Period Weeks   ? Status On-going   ? Target Date 08/21/21   ?  ? OT LONG TERM GOAL #4  ? Title Pt will increase LUE grip strength to 25 lbs or greater for increased ease with ADLs.   ? Baseline 12/13/20: 24# left, 1# right 10/31/2020: Pt. continues to present with limited left grip. Pt. has improved with left grip strength. Pt.continues to work towards 25#. 08/08/20: L grip 19#; 03/07/21: L grip 21 lbs (R 1 lb) 05/18/2021: Left grip strength: 30#; 07/04/21:L grip 21#, R 0#   ? Time 12   ? Period Weeks   ? Status On-going   ? Target Date 08/21/21   ?  ? OT LONG TERM GOAL #5  ? Title 10/31/2020: Pt. is now perfroming laundry, loading the dishwasher, assists some with cooking, light meal preparation, setting the table   ? Baseline 12/13/2020:  continues to assist as needed but uses left hand .   10/13/20: Pt. assists with filling the dishwasher, and performing laundry. Pt. continues to try to help when she can, however her husband mostly performs these tasks. 08/08/20: Husband performs; 03/07/21: Pt assi

## 2021-09-12 ENCOUNTER — Ambulatory Visit: Payer: Medicare PPO | Attending: Physical Medicine & Rehabilitation

## 2021-09-12 DIAGNOSIS — M79641 Pain in right hand: Secondary | ICD-10-CM | POA: Insufficient documentation

## 2021-09-12 DIAGNOSIS — I69159 Hemiplegia and hemiparesis following nontraumatic intracerebral hemorrhage affecting unspecified side: Secondary | ICD-10-CM | POA: Insufficient documentation

## 2021-09-12 DIAGNOSIS — M6281 Muscle weakness (generalized): Secondary | ICD-10-CM | POA: Insufficient documentation

## 2021-09-12 DIAGNOSIS — R278 Other lack of coordination: Secondary | ICD-10-CM | POA: Insufficient documentation

## 2021-09-13 NOTE — Therapy (Signed)
[place] [facility] ?[place], [place], [address] ?Phone: [phone]   Fax:  [phone] ? ?Occupational Therapy Treatment ? ?Patient Details  ?Name: Erika Stanton ?MRN: [id number] ?Date of Birth: 12-04-1955 ?No data recorded ? ?Encounter Date: 09/12/2021 ? ? OT End of Session - 09/13/21 1155   ? ? Visit Number 136   ? Number of Visits 148   ? Date for OT Re-Evaluation 11/09/21   ? Authorization Type Progress report period starting 07/06/21   ? Authorization Time Period Medicare   ? OT Start Time 1300   ? OT Stop Time 1345   ? OT Time Calculation (min) 45 min   ? Activity Tolerance Patient tolerated treatment well   ? Behavior During Therapy [facility] for tasks assessed/performed   ? ?  ?  ? ?  ? ? ?Past Medical History:  ?Diagnosis Date  ? Aneurysm ([place])   ? Melena   ? ? ?Past Surgical History:  ?Procedure Laterality Date  ? CHOLECYSTECTOMY    ? COLONOSCOPY  11/2017  ? at [facility]. no recurrent polyps.  suggest repeat surveillance study 11/2022.    ? COLONOSCOPY W/ POLYPECTOMY  06/2014  ? Dr Roxy Manns at [facility].  3 adenomatoous polyps, anal fissure.    ? ESOPHAGOGASTRODUODENOSCOPY (EGD) WITH PROPOFOL N/A 01/27/2020  ? Procedure: ESOPHAGOGASTRODUODENOSCOPY (EGD) WITH PROPOFOL;  Surgeon: Mauri Pole, MD;  Location: [facility];  Service: Endoscopy;  Laterality: N/A;  ? IR 3D INDEPENDENT WKST  01/06/2020  ? IR ANGIO INTRA EXTRACRAN SEL INTERNAL CAROTID BILAT MOD SED  01/06/2020  ? IR ANGIO VERTEBRAL SEL VERTEBRAL UNI L MOD SED  01/06/2020  ? IR ANGIOGRAM FOLLOW UP STUDY  01/06/2020  ? IR ANGIOGRAM FOLLOW UP STUDY  01/06/2020  ? IR ANGIOGRAM FOLLOW UP STUDY  01/06/2020  ? IR ANGIOGRAM FOLLOW UP STUDY  01/06/2020  ? IR ANGIOGRAM FOLLOW UP STUDY  01/06/2020  ? IR ANGIOGRAM FOLLOW UP STUDY  01/06/2020  ? IR ANGIOGRAM FOLLOW UP STUDY  01/06/2020  ? IR ANGIOGRAM FOLLOW UP STUDY  01/06/2020  ? IR ANGIOGRAM FOLLOW UP STUDY  01/06/2020  ? IR ANGIOGRAM FOLLOW UP STUDY  01/06/2020  ? IR  NEURO EACH ADD'L AFTER BASIC UNI RIGHT (MS)  01/06/2020  ? IR TRANSCATH/EMBOLIZ  01/06/2020  ? RADIOLOGY WITH ANESTHESIA N/A 01/06/2020  ? Procedure: IR WITH ANESTHESIA FOR ANEURYSM;  Surgeon: Consuella Lose, MD;  Location: [facility];  Service: Radiology;  Laterality: N/A;  ? TUBAL LIGATION    ? ? ?There were no vitals filed for this visit. ? ? Subjective Assessment - 09/12/21 1152   ? ? Subjective  Pt reports she was offered a shot in her hand vs her forearm to try and loosen up her fingers but she didn't feel ready to do that.   ? Patient is accompanied by: Family member   ? Pertinent History ICH 01/06/20, Aneurysm. PMH: OA bilateral knees and hands   ? Repetition Decreases Symptoms   ? Patient Stated Goals Pt would like to be as independent as possible.   ? Currently in Pain? Yes   ? Pain Score 3    ? Pain Location Hand   ? Pain Orientation Right   ? Pain Descriptors / Indicators Aching;Discomfort;Tightness   ? Pain Type Chronic pain   ? Pain Radiating Towards shoulder, hand, fingers   ? Pain Onset More than a month ago   ? Pain Frequency Intermittent   ? Aggravating Factors  with attempts at finger flexion or reaching above shoulder level   ? Pain Relieving Factors rest/heat/gentle stretch   ? Effect of Pain on Daily Activities decreased activity tolerance when using RUE   ? Multiple Pain Sites No   ? Pain Onset More than a month ago   ? ?  ?  ? ?  ? ?Occupational Therapy Treatment: ?Moist heat applied to R shoulder and R hand x 5 min for pain reduction/muscle relaxation in prep for ROM and strengthening exercises.   ? ?Therapeutic Exercise: ?Passive stretching performed for R shoulder flex/abd, horiz abd/add, and prolonged stretch for R shoulder ER, working to increase R shoulder mobility for UB ADLs.  Completed AAROM for R shoulder flex, abd, and horiz add x2 sets 10 reps each.  Ace wrapped R hand digits into flexion around palm sized ball to promote prolonged stretch during AAROM for shoulder noted above.  Then  removed ACE wrap to perform slow, prolonged stretch for R hand IF and LF MCP, PIP, DIP flexion, and MCP and PIP ext of digits 2-5 with good tolerance.  Performed digit blocking active and active assisted for R IF and LF DIP and PIP flexion x2 sets 10 reps each.   ? ?Response to Treatment: ?Pt tolerated R shoulder PROM and AAROM well to R shoulder, and good tolerance to prolonged passive stretching for R hand digit flexion using ACE wrap, as well as PROM and digit blocking with therapist.  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  Pt will continue to benefit from skilled OT for managing R hand stiffness with therapeutic exercise and flexion glove, managing R shoulder pain, working to increase strength and flexibility throughout RUE, and increasing functional use of RUE for daily tasks.  ? ? ? OT Education - 09/12/21 1155   ? ? Education Details consistent use of flexion glove and ROM at home   ? Person(s) Educated Patient   ? Methods Explanation;Verbal cues   ? Comprehension Verbalized understanding;Returned demonstration;Tactile cues required;Verbal cues required;Need further instruction   ? ?  ?  ? ?  ? ? ? OT Short Term Goals - 07/04/21 1355   ? ?  ? OT SHORT TERM GOAL #1  ? Title I with initial HEP.   ? Time 6   ? Period Weeks   ? Status Achieved   ? Target Date 04/13/20   ?  ? OT SHORT TERM GOAL #4  ? Title Pt will demonstrate 60* A/ROM shoulder flexion for RUE with pain less than or equal to 4/10.   ? Baseline Pt. presents with limited isolated  right shoulder flexion with pain continues to be 2. 08/08/20: 40* AROM with 3/10 pain; 03/07/21: Pt demos 75* active shoulder flexion with 2/10 pain or less. 40th visit: Pt. continues to present with limited shoulder ROM, and pain has decreased; 07/04/21 R shoulder active flexion 85, 3/10 pain.   ? Time 4   ? Period Weeks   ? Status On-going   ? Target Date 08/21/21   ?  ? OT SHORT TERM GOAL #5  ? Title Pt will  demonstrate improved LUE fine motor coordination for ADLs as evidenced by decreasing 9 hole peg test score to 25 secs or less.   ? Baseline 10/31/2020: Left FMC: 32 sec. Left FMC 41 sec visa the 9 hole peg test. 08/08/20: 32 seconds via 9 hole peg 40th visit: Left hand: 35 sec.; 07/04/21: L hand 28 sec   ?  Time 12   ? Period Weeks   ? Status On-going   ? Target Date 08/21/21   ?  ? OT SHORT TERM GOAL #7  ? Title Pt will demonstrate at least 25% finger flexion/ extension in RUE in prep for functional use.   ? Baseline Limited PROM, and active digit flexion, and extension. Improved AROM for right 2nd, and 3rd digits. significant limitations with the 4th, and 5th digits. 08/08/20: AROM digit MCP 2nd (80*), 3rd (85*), 4th (80*), 5th (90*); 2/21/2: Since Botox injection in Dec, R hand has had limited active movement; see note for details   ? Time 4   ? Period Weeks   ? Status On-going   ? Target Date 09/05/20   ? ?  ?  ? ?  ? ? ? ? OT Long Term Goals - 07/05/21 1417   ? ?  ? OT LONG TERM GOAL #3  ? Title Pt will use RUE as a stabilizer/ gross A at least 30 % of the time for ADLs/ IADLs.   ? Baseline Pt. is using her right hand as a gross stabilizer 15% of the time. 08/08/20: requires MAX cues; 03/07/21 Pt using R hand as a gross stabilizer at least 25% of the time. 40th visit: Pt. has difficulty using her right hand  as a gross stabilizer; 07/04/21: Pt uses RUE as a stabilizer 40-50% of the time for ADLs/IADLs.   ? Time 12   ? Period Weeks   ? Status On-going   ? Target Date 08/21/21   ?  ? OT LONG TERM GOAL #4  ? Title Pt will increase LUE grip strength to 25 lbs or greater for increased ease with ADLs.   ? Baseline 12/13/20: 24# left, 1# right 10/31/2020: Pt. continues to present with limited left grip. Pt. has improved with left grip strength. Pt.continues to work towards 25#. 08/08/20: L grip 19#; 03/07/21: L grip 21 lbs (R 1 lb) 05/18/2021: Left grip strength: 30#; 07/04/21:L grip 21#, R 0#   ? Time 12   ? Period Weeks   ?  Status On-going   ? Target Date 08/21/21   ?  ? OT LONG TERM GOAL #5  ? Title 10/31/2020: Pt. is now perfroming laundry, loading the dishwasher, assists some with cooking, light meal preparation, setting the table

## 2021-09-14 ENCOUNTER — Ambulatory Visit: Payer: Medicare PPO | Admitting: Occupational Therapy

## 2021-09-14 DIAGNOSIS — R278 Other lack of coordination: Secondary | ICD-10-CM

## 2021-09-14 DIAGNOSIS — M6281 Muscle weakness (generalized): Secondary | ICD-10-CM | POA: Diagnosis not present

## 2021-09-14 DIAGNOSIS — I69159 Hemiplegia and hemiparesis following nontraumatic intracerebral hemorrhage affecting unspecified side: Secondary | ICD-10-CM

## 2021-09-14 DIAGNOSIS — M79641 Pain in right hand: Secondary | ICD-10-CM

## 2021-09-16 ENCOUNTER — Encounter: Payer: Self-pay | Admitting: Occupational Therapy

## 2021-09-16 NOTE — Therapy (Signed)
[place] ?[facility] ?[place], [place], [address] ?Phone: [phone]   Fax:  [phone] ? ?Occupational Therapy Treatment ? ?Patient Details  ?Name: Erika Stanton ?MRN: [id number] ?Date of Birth: 16-Dec-1955 ?No data recorded ? ?Encounter Date: 09/14/2021 ? ? OT End of Session - 09/16/21 1732   ? ? Visit Number 137   ? Number of Visits 148   ? Date for OT Re-Evaluation 11/09/21   ? Authorization Type Progress report period starting 07/06/21   ? Authorization Time Period Medicare   ? OT Start Time 1301   ? OT Stop Time 1345   ? OT Time Calculation (min) 44 min   ? Activity Tolerance Patient tolerated treatment well   ? Behavior During Therapy [facility] for tasks assessed/performed   ? ?  ?  ? ?  ? ? ?Past Medical History:  ?Diagnosis Date  ? Aneurysm ([place])   ? Melena   ? ? ?Past Surgical History:  ?Procedure Laterality Date  ? CHOLECYSTECTOMY    ? COLONOSCOPY  11/2017  ? at [facility]. no recurrent polyps.  suggest repeat surveillance study 11/2022.    ? COLONOSCOPY W/ POLYPECTOMY  06/2014  ? Dr Roxy Manns at [facility].  3 adenomatoous polyps, anal fissure.    ? ESOPHAGOGASTRODUODENOSCOPY (EGD) WITH PROPOFOL N/A 01/27/2020  ? Procedure: ESOPHAGOGASTRODUODENOSCOPY (EGD) WITH PROPOFOL;  Surgeon: Mauri Pole, MD;  Location: [facility];  Service: Endoscopy;  Laterality: N/A;  ? IR 3D INDEPENDENT WKST  01/06/2020  ? IR ANGIO INTRA EXTRACRAN SEL INTERNAL CAROTID BILAT MOD SED  01/06/2020  ? IR ANGIO VERTEBRAL SEL VERTEBRAL UNI L MOD SED  01/06/2020  ? IR ANGIOGRAM FOLLOW UP STUDY  01/06/2020  ? IR ANGIOGRAM FOLLOW UP STUDY  01/06/2020  ? IR ANGIOGRAM FOLLOW UP STUDY  01/06/2020  ? IR ANGIOGRAM FOLLOW UP STUDY  01/06/2020  ? IR ANGIOGRAM FOLLOW UP STUDY  01/06/2020  ? IR ANGIOGRAM FOLLOW UP STUDY  01/06/2020  ? IR ANGIOGRAM FOLLOW UP STUDY  01/06/2020  ? IR ANGIOGRAM FOLLOW UP STUDY  01/06/2020  ? IR ANGIOGRAM FOLLOW UP STUDY  01/06/2020  ? IR ANGIOGRAM FOLLOW UP STUDY  01/06/2020  ? IR  NEURO EACH ADD'L AFTER BASIC UNI RIGHT (MS)  01/06/2020  ? IR TRANSCATH/EMBOLIZ  01/06/2020  ? RADIOLOGY WITH ANESTHESIA N/A 01/06/2020  ? Procedure: IR WITH ANESTHESIA FOR ANEURYSM;  Surgeon: Consuella Lose, MD;  Location: [facility];  Service: Radiology;  Laterality: N/A;  ? TUBAL LIGATION    ? ? ?There were no vitals filed for this visit. ? ? Subjective Assessment - 09/16/21 1731   ? ? Subjective  Pt reports her sister is in town and they are spending time together today, going to lunch and shopping after therapy.   ? Pertinent History ICH 01/06/20, Aneurysm. PMH: OA bilateral knees and hands   ? Patient Stated Goals Pt would like to be as independent as possible.   ? Currently in Pain? Yes   ? Pain Score 2    ? Pain Location Hand   ? Pain Orientation Right   ? Pain Descriptors / Indicators Aching;Tightness   ? Pain Type Chronic pain   ? Pain Onset More than a month ago   ? Pain Frequency Intermittent   ? ?  ?  ? ?  ? ? ?Occupational Therapy Treatment: ?Moist heat applied to R shoulder and R hand x 5 min for pain reduction/muscle relaxation in prep for ROM and  strengthening exercises.   ? ?Therapeutic Exercise: ?Performed slow, passive stretching for R IF and LF MP, PIP, and DIP flexion, and passive extension stretching for RF and SF at MP and PIP joints to tolerance. Active digit blocking exercises for RF and LF DIP and PIP joints with only trace movement. ?Reaching activity with right shoulder with AAROM within pain tolerances and in multi directional planes of motion.   ?Pt seen for attempts to remove resistive velcro squares from board, cues for pinch and grip patterns.  Reaching to replace items at an angle on tabletop, cues for shoulder movement to decrease hiking of shoulder and promotion of forward flexion.   ? ? ?  ?Response to Treatment: ?Pt able to demonstrate modified grasping patterns with right hand, remains limited with index and middle finger remaining in extension.  She does demonstrate some increased  passive motion in these fingers but demonstrates difficulty with active movement patterns and tends to use more of a lateral pinch pattern.  Although web space is tight on the right as well.  Continue to work towards goals in plan of care to improve R UE ROM, strength and use as an assist in daily tasks.   ? ? ? ? ? ? ? ? ? ? ? ? ? ? ? ? ? ? ? OT Education - 09/16/21 1732   ? ? Education Details ROM, flexion of index and middle fingers, grasping   ? Person(s) Educated Patient   ? Methods Explanation;Verbal cues   ? Comprehension Verbalized understanding;Returned demonstration;Tactile cues required;Verbal cues required;Need further instruction   ? ?  ?  ? ?  ? ? ? OT Short Term Goals - 07/04/21 1355   ? ?  ? OT SHORT TERM GOAL #1  ? Title I with initial HEP.   ? Time 6   ? Period Weeks   ? Status Achieved   ? Target Date 04/13/20   ?  ? OT SHORT TERM GOAL #4  ? Title Pt will demonstrate 60* A/ROM shoulder flexion for RUE with pain less than or equal to 4/10.   ? Baseline Pt. presents with limited isolated  right shoulder flexion with pain continues to be 2. 08/08/20: 40* AROM with 3/10 pain; 03/07/21: Pt demos 75* active shoulder flexion with 2/10 pain or less. 40th visit: Pt. continues to present with limited shoulder ROM, and pain has decreased; 07/04/21 R shoulder active flexion 85, 3/10 pain.   ? Time 4   ? Period Weeks   ? Status On-going   ? Target Date 08/21/21   ?  ? OT SHORT TERM GOAL #5  ? Title Pt will demonstrate improved LUE fine motor coordination for ADLs as evidenced by decreasing 9 hole peg test score to 25 secs or less.   ? Baseline 10/31/2020: Left FMC: 32 sec. Left FMC 41 sec visa the 9 hole peg test. 08/08/20: 32 seconds via 9 hole peg 40th visit: Left hand: 35 sec.; 07/04/21: L hand 28 sec   ? Time 12   ? Period Weeks   ? Status On-going   ? Target Date 08/21/21   ?  ? OT SHORT TERM GOAL #7  ? Title Pt will demonstrate at least 25% finger flexion/ extension in RUE in prep for functional use.   ?  Baseline Limited PROM, and active digit flexion, and extension. Improved AROM for right 2nd, and 3rd digits. significant limitations with the 4th, and 5th digits. 08/08/20: AROM digit MCP 2nd (80*), 3rd (85*),  4th (80*), 5th (90*); 2/21/2: Since Botox injection in Dec, R hand has had limited active movement; see note for details   ? Time 4   ? Period Weeks   ? Status On-going   ? Target Date 09/05/20   ? ?  ?  ? ?  ? ? ? ? OT Long Term Goals - 07/05/21 1417   ? ?  ? OT LONG TERM GOAL #3  ? Title Pt will use RUE as a stabilizer/ gross A at least 30 % of the time for ADLs/ IADLs.   ? Baseline Pt. is using her right hand as a gross stabilizer 15% of the time. 08/08/20: requires MAX cues; 03/07/21 Pt using R hand as a gross stabilizer at least 25% of the time. 40th visit: Pt. has difficulty using her right hand  as a gross stabilizer; 07/04/21: Pt uses RUE as a stabilizer 40-50% of the time for ADLs/IADLs.   ? Time 12   ? Period Weeks   ? Status On-going   ? Target Date 08/21/21   ?  ? OT LONG TERM GOAL #4  ? Title Pt will increase LUE grip strength to 25 lbs or greater for increased ease with ADLs.   ? Baseline 12/13/20: 24# left, 1# right 10/31/2020: Pt. continues to present with limited left grip. Pt. has improved with left grip strength. Pt.continues to work towards 25#. 08/08/20: L grip 19#; 03/07/21: L grip 21 lbs (R 1 lb) 05/18/2021: Left grip strength: 30#; 07/04/21:L grip 21#, R 0#   ? Time 12   ? Period Weeks   ? Status On-going   ? Target Date 08/21/21   ?  ? OT LONG TERM GOAL #5  ? Title 10/31/2020: Pt. is now perfroming laundry, loading the dishwasher, assists some with cooking, light meal preparation, setting the table   ? Baseline 12/13/2020:  continues to assist as needed but uses left hand .  10/13/20: Pt. assists with filling the dishwasher, and performing laundry. Pt. continues to try to help when she can, however her husband mostly performs these tasks. 08/08/20: Husband performs; 03/07/21: Pt assists with laundry,  loading dishwasher, light cooking/meal prep; mostly using LUE. 05/18/2021: pt. continues to assist with laundry skills, loading the dishwasher, light cooking using the left hand; 07/04/21: using L hand for

## 2021-09-21 ENCOUNTER — Ambulatory Visit: Payer: Medicare PPO

## 2021-09-21 DIAGNOSIS — R278 Other lack of coordination: Secondary | ICD-10-CM

## 2021-09-21 DIAGNOSIS — M6281 Muscle weakness (generalized): Secondary | ICD-10-CM

## 2021-09-22 NOTE — Therapy (Signed)
[facility] ?[place], [place], [address] ?Phone: [phone]   Fax:  [phone] ? ?Occupational Therapy Treatment ? ?Patient Details  ?Name: Erika Stanton ?MRN: [id number] ?Date of Birth: 01-11-1956 ?No data recorded ? ?Encounter Date: 09/21/2021 ? ? OT End of Session - 09/22/21 0847   ? ? Visit Number 138   ? Number of Visits 148   ? Date for OT Re-Evaluation 11/09/21   ? Authorization Type Progress report period starting 07/06/21   ? OT Start Time 1300   ? OT Stop Time 1345   ? OT Time Calculation (min) 45 min   ? Activity Tolerance Patient tolerated treatment well   ? Behavior During Therapy [facility] for tasks assessed/performed   ? ?  ?  ? ?  ? ? ?Past Medical History:  ?Diagnosis Date  ? Aneurysm ([place])   ? Melena   ? ? ?Past Surgical History:  ?Procedure Laterality Date  ? CHOLECYSTECTOMY    ? COLONOSCOPY  11/2017  ? at [facility]. no recurrent polyps.  suggest repeat surveillance study 11/2022.    ? COLONOSCOPY W/ POLYPECTOMY  06/2014  ? Dr Roxy Manns at [facility].  3 adenomatoous polyps, anal fissure.    ? ESOPHAGOGASTRODUODENOSCOPY (EGD) WITH PROPOFOL N/A 01/27/2020  ? Procedure: ESOPHAGOGASTRODUODENOSCOPY (EGD) WITH PROPOFOL;  Surgeon: Mauri Pole, MD;  Location: [facility];  Service: Endoscopy;  Laterality: N/A;  ? IR 3D INDEPENDENT WKST  01/06/2020  ? IR ANGIO INTRA EXTRACRAN SEL INTERNAL CAROTID BILAT MOD SED  01/06/2020  ? IR ANGIO VERTEBRAL SEL VERTEBRAL UNI L MOD SED  01/06/2020  ? IR ANGIOGRAM FOLLOW UP STUDY  01/06/2020  ? IR ANGIOGRAM FOLLOW UP STUDY  01/06/2020  ? IR ANGIOGRAM FOLLOW UP STUDY  01/06/2020  ? IR ANGIOGRAM FOLLOW UP STUDY  01/06/2020  ? IR ANGIOGRAM FOLLOW UP STUDY  01/06/2020  ? IR ANGIOGRAM FOLLOW UP STUDY  01/06/2020  ? IR ANGIOGRAM FOLLOW UP STUDY  01/06/2020  ? IR ANGIOGRAM FOLLOW UP STUDY  01/06/2020  ? IR ANGIOGRAM FOLLOW UP STUDY  01/06/2020  ? IR ANGIOGRAM FOLLOW UP STUDY  01/06/2020  ? IR NEURO EACH ADD'L AFTER BASIC UNI RIGHT  (MS)  01/06/2020  ? IR TRANSCATH/EMBOLIZ  01/06/2020  ? RADIOLOGY WITH ANESTHESIA N/A 01/06/2020  ? Procedure: IR WITH ANESTHESIA FOR ANEURYSM;  Surgeon: Consuella Lose, MD;  Location: [facility];  Service: Radiology;  Laterality: N/A;  ? TUBAL LIGATION    ? ? ?There were no vitals filed for this visit. ? ? Subjective Assessment - 09/21/21 0845   ? ? Subjective  Pt reports having a nice visit with her sister being in town this week.   ? Patient is accompanied by: Family member   ? Pertinent History ICH 01/06/20, Aneurysm. PMH: OA bilateral knees and hands   ? Repetition Decreases Symptoms   ? Patient Stated Goals Pt would like to be as independent as possible.   ? Currently in Pain? Yes   ? Pain Score 2    ? Pain Location Hand   ? Pain Orientation Right   ? Pain Descriptors / Indicators Aching;Tightness   ? Pain Type Chronic pain   ? Pain Radiating Towards shoulder, hand, fingers   ? Pain Onset More than a month ago   ? Pain Frequency Intermittent   ? Aggravating Factors  with attempts at finger flexion or reaching above shoulder level   ? Pain Relieving Factors rest/heat/gentle stretch   ?  Effect of Pain on Daily Activities decreased activity tolerance when using RUE   ? Multiple Pain Sites No   ? Pain Onset More than a month ago   ? ?  ?  ? ?  ? ?Occupational Therapy Treatment: ?Moist heat applied to R shoulder and R hand x 5 min for pain reduction/muscle relaxation in prep for ROM and strengthening exercises. ? ?Therapeutic Exercise: ?Performed slow, prolonged stretch for R hand IF and LF MCP, PIP, DIP flexion, and MCP and PIP ext of digits 2-5 with good tolerance.  Passive stretching performed for R shoulder flex/abd, horiz abd/add, and prolonged stretch for R shoulder ER, working to increase R shoulder mobility for UB ADLs.  Ace wrapped R hand digits into flexion around palm sized ball to promote prolonged stretch during PROM for shoulder noted above.  Removed ACE wrap for participation in functional reaching.   Jumbo pegs placed in pegboard on an incline to promote R shoulder flexion.  Pt removed pegs to promote reaching with grasp/release, discarding pegs into container at table top level.  Pegboard placed in variety of positions on table top incline to promote forward flexion, horiz add and horiz abd.  Pt used a quadrupod grasp, leaving out the 5th digit, and PIPs and DIPs remained in full extension.  Rest breaks taken between peg board positional changes.  Intermittent tactile cues to maintain shoulder depression and promoting elbow extension when reaching for pegs.  ? ?Response to Treatment: ?Pt tolerated R shoulder PROM and AROM well to R shoulder, and good tolerance to prolonged passive stretching for R hand digit flexion using ACE wrap.  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  Pt is improving with functional reaching activities for RUE, noting less of a shoulder hike when reaching forward for objects close to or below shoulder height.  Pt will continue to benefit from skilled OT for managing R hand stiffness with therapeutic exercise and flexion glove, managing R shoulder pain, working to increase strength and flexibility throughout RUE, and increasing functional use of RUE for daily tasks.  ? ? OT Education - 09/21/21 0847   ? ? Education Details ROM, grasping   ? Person(s) Educated Patient   ? Methods Explanation;Verbal cues   ? Comprehension Verbalized understanding;Returned demonstration;Tactile cues required;Verbal cues required;Need further instruction   ? ?  ?  ? ?  ? ? ? OT Short Term Goals - 07/04/21 1355   ? ?  ? OT SHORT TERM GOAL #1  ? Title I with initial HEP.   ? Time 6   ? Period Weeks   ? Status Achieved   ? Target Date 04/13/20   ?  ? OT SHORT TERM GOAL #4  ? Title Pt will demonstrate 60* A/ROM shoulder flexion for RUE with pain less than or equal to 4/10.   ? Baseline Pt. presents with limited isolated  right shoulder flexion with pain  continues to be 2. 08/08/20: 40* AROM with 3/10 pain; 03/07/21: Pt demos 75* active shoulder flexion with 2/10 pain or less. 40th visit: Pt. continues to present with limited shoulder ROM, and pain has decreased; 07/04/21 R shoulder active flexion 85, 3/10 pain.   ? Time 4   ? Period Weeks   ? Status On-going   ? Target Date 08/21/21   ?  ? OT SHORT TERM GOAL #5  ? Title Pt will demonstrate improved LUE fine motor coordination for ADLs as evidenced by decreasing  9 hole peg test score to 25 secs or less.   ? Baseline 10/31/2020: Left FMC: 32 sec. Left FMC 41 sec visa the 9 hole peg test. 08/08/20: 32 seconds via 9 hole peg 40th visit: Left hand: 35 sec.; 07/04/21: L hand 28 sec   ? Time 12   ? Period Weeks   ? Status On-going   ? Target Date 08/21/21   ?  ? OT SHORT TERM GOAL #7  ? Title Pt will demonstrate at least 25% finger flexion/ extension in RUE in prep for functional use.   ? Baseline Limited PROM, and active digit flexion, and extension. Improved AROM for right 2nd, and 3rd digits. significant limitations with the 4th, and 5th digits. 08/08/20: AROM digit MCP 2nd (80*), 3rd (85*), 4th (80*), 5th (90*); 2/21/2: Since Botox injection in Dec, R hand has had limited active movement; see note for details   ? Time 4   ? Period Weeks   ? Status On-going   ? Target Date 09/05/20   ? ?  ?  ? ?  ? ? ? ? OT Long Term Goals - 07/05/21 1417   ? ?  ? OT LONG TERM GOAL #3  ? Title Pt will use RUE as a stabilizer/ gross A at least 30 % of the time for ADLs/ IADLs.   ? Baseline Pt. is using her right hand as a gross stabilizer 15% of the time. 08/08/20: requires MAX cues; 03/07/21 Pt using R hand as a gross stabilizer at least 25% of the time. 40th visit: Pt. has difficulty using her right hand  as a gross stabilizer; 07/04/21: Pt uses RUE as a stabilizer 40-50% of the time for ADLs/IADLs.   ? Time 12   ? Period Weeks   ? Status On-going   ? Target Date 08/21/21   ?  ? OT LONG TERM GOAL #4  ? Title Pt will increase LUE grip strength  to 25 lbs or greater for increased ease with ADLs.   ? Baseline 12/13/20: 24# left, 1# right 10/31/2020: Pt. continues to present with limited left grip. Pt. has improved with left grip strength. Pt.continues to w

## 2021-09-26 ENCOUNTER — Ambulatory Visit: Payer: Medicare PPO

## 2021-09-26 DIAGNOSIS — M6281 Muscle weakness (generalized): Secondary | ICD-10-CM | POA: Diagnosis not present

## 2021-09-26 DIAGNOSIS — I69159 Hemiplegia and hemiparesis following nontraumatic intracerebral hemorrhage affecting unspecified side: Secondary | ICD-10-CM

## 2021-09-26 DIAGNOSIS — R278 Other lack of coordination: Secondary | ICD-10-CM

## 2021-09-27 NOTE — Therapy (Signed)
[facility] ?[place], [place], [address] ?Phone: [phone]   Fax:  [phone] ? ?Occupational Therapy Treatment ? ?Patient Details  ?Name: Erika Stanton ?MRN: [id number] ?Date of Birth: 07/17/1955 ?No data recorded ? ?Encounter Date: 09/26/2021 ? ? OT End of Session - 09/27/21 1232   ? ? Visit Number 139   ? Number of Visits 148   ? Date for OT Re-Evaluation 11/09/21   ? Authorization Type Progress report period starting 07/06/21   ? Authorization Time Period Medicare   ? OT Start Time 1300   ? OT Stop Time 1345   ? OT Time Calculation (min) 45 min   ? Activity Tolerance Patient tolerated treatment well   ? Behavior During Therapy [facility] for tasks assessed/performed   ? ?  ?  ? ?  ? ? ?Past Medical History:  ?Diagnosis Date  ? Aneurysm ([place])   ? Melena   ? ? ?Past Surgical History:  ?Procedure Laterality Date  ? CHOLECYSTECTOMY    ? COLONOSCOPY  11/2017  ? at [facility]. no recurrent polyps.  suggest repeat surveillance study 11/2022.    ? COLONOSCOPY W/ POLYPECTOMY  06/2014  ? Dr Roxy Manns at [facility].  3 adenomatoous polyps, anal fissure.    ? ESOPHAGOGASTRODUODENOSCOPY (EGD) WITH PROPOFOL N/A 01/27/2020  ? Procedure: ESOPHAGOGASTRODUODENOSCOPY (EGD) WITH PROPOFOL;  Surgeon: Mauri Pole, MD;  Location: [facility];  Service: Endoscopy;  Laterality: N/A;  ? IR 3D INDEPENDENT WKST  01/06/2020  ? IR ANGIO INTRA EXTRACRAN SEL INTERNAL CAROTID BILAT MOD SED  01/06/2020  ? IR ANGIO VERTEBRAL SEL VERTEBRAL UNI L MOD SED  01/06/2020  ? IR ANGIOGRAM FOLLOW UP STUDY  01/06/2020  ? IR ANGIOGRAM FOLLOW UP STUDY  01/06/2020  ? IR ANGIOGRAM FOLLOW UP STUDY  01/06/2020  ? IR ANGIOGRAM FOLLOW UP STUDY  01/06/2020  ? IR ANGIOGRAM FOLLOW UP STUDY  01/06/2020  ? IR ANGIOGRAM FOLLOW UP STUDY  01/06/2020  ? IR ANGIOGRAM FOLLOW UP STUDY  01/06/2020  ? IR ANGIOGRAM FOLLOW UP STUDY  01/06/2020  ? IR ANGIOGRAM FOLLOW UP STUDY  01/06/2020  ? IR ANGIOGRAM FOLLOW UP STUDY  01/06/2020  ? IR  NEURO EACH ADD'L AFTER BASIC UNI RIGHT (MS)  01/06/2020  ? IR TRANSCATH/EMBOLIZ  01/06/2020  ? RADIOLOGY WITH ANESTHESIA N/A 01/06/2020  ? Procedure: IR WITH ANESTHESIA FOR ANEURYSM;  Surgeon: Consuella Lose, MD;  Location: [facility];  Service: Radiology;  Laterality: N/A;  ? TUBAL LIGATION    ? ? ?There were no vitals filed for this visit. ? ? Subjective Assessment - 09/26/21 1226   ? ? Subjective  Pt reports doing well today.   ? Patient is accompanied by: Family member   ? Pertinent History ICH 01/06/20, Aneurysm. PMH: OA bilateral knees and hands   ? Repetition Decreases Symptoms   ? Patient Stated Goals Pt would like to be as independent as possible.   ? Currently in Pain? Yes   ? Pain Score 1    ? Pain Location Hand   ? Pain Orientation Right   ? Pain Descriptors / Indicators Aching;Tightness   ? Pain Type Chronic pain   ? Pain Radiating Towards shoulder, hand, fingers   ? Pain Onset More than a month ago   ? Pain Frequency Intermittent   ? Aggravating Factors  with attempts at finger flexion or reaching above shoulder level   ? Pain Relieving Factors rest/heat/gentle stretch   ? Effect  of Pain on Daily Activities decreased activity tolerance when using RUE   ? Multiple Pain Sites Yes   ? Pain Score 3   ? Pain Location Knee   ? Pain Orientation Right;Left   ? Pain Descriptors / Indicators Aching   ? Pain Type Chronic pain   ? Pain Onset More than a month ago   ? Pain Frequency Intermittent   ? Aggravating Factors  N/A   ? Pain Relieving Factors rest, heat   ? Effect of Pain on Daily Activities pain with ambulation   ? ?  ?  ? ?  ? ?Occupational Therapy Treatment: ?Moist heat applied to R shoulder and R hand x 5 min for pain reduction/muscle relaxation in prep for ROM and strengthening exercises. ?  ?Therapeutic Exercise: ?Performed slow, prolonged stretch for R hand IF and LF MCP, PIP, DIP flexion, and MCP and PIP ext of digits 2-5 with good tolerance.  Passive stretching performed for R shoulder flex/abd, horiz  abd/add, and prolonged stretch for R shoulder ER, working to increase R shoulder mobility for UB ADLs.  Ace wrapped R hand digits into flexion around palm sized ball to promote prolonged stretch during balloon batting.  Facilitated R shoulder multiplane strengthening with balloon batting.  Frequent rest breaks needed, with cues to extend arm vs leaning in with trunk to reach for balloon. ? ?Response to Treatment: ?Pt tolerated R shoulder PROM and AROM well, and good tolerance to prolonged passive stretching for R hand digit flexion using ACE wrap around ball x15 min.  Pt reported that she really liked hitting the balloon and though she could do this at home with her husband.  OT encouraged this.  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  Pt is improving with functional reaching activities for RUE, noting less of a shoulder hike when reaching forward for objects close to or below shoulder height.  Pt requires min vc to avoid compensatory movement patterns.  Pt will continue to benefit from skilled OT for managing R hand stiffness with therapeutic exercise and flexion glove, managing R shoulder pain, working to increase strength and flexibility throughout RUE, and increasing functional use of RUE for daily tasks.  ? ? ? OT Education - 09/26/21 1232   ? ? Education Details ROM, grasping   ? Person(s) Educated Patient   ? Methods Explanation;Verbal cues;Demonstration   ? Comprehension Verbalized understanding;Returned demonstration;Tactile cues required;Verbal cues required;Need further instruction   ? ?  ?  ? ?  ? ? ? OT Short Term Goals - 07/04/21 1355   ? ?  ? OT SHORT TERM GOAL #1  ? Title I with initial HEP.   ? Time 6   ? Period Weeks   ? Status Achieved   ? Target Date 04/13/20   ?  ? OT SHORT TERM GOAL #4  ? Title Pt will demonstrate 60* A/ROM shoulder flexion for RUE with pain less than or equal to 4/10.   ? Baseline Pt. presents with limited isolated   right shoulder flexion with pain continues to be 2. 08/08/20: 40* AROM with 3/10 pain; 03/07/21: Pt demos 75* active shoulder flexion with 2/10 pain or less. 40th visit: Pt. continues to present with limited shoulder ROM, and pain has decreased; 07/04/21 R shoulder active flexion 85, 3/10 pain.   ? Time 4   ? Period Weeks   ? Status On-going   ? Target Date 08/21/21   ?  ?  OT SHORT TERM GOAL #5  ? Title Pt will demonstrate improved LUE fine motor coordination for ADLs as evidenced by decreasing 9 hole peg test score to 25 secs or less.   ? Baseline 10/31/2020: Left FMC: 32 sec. Left FMC 41 sec visa the 9 hole peg test. 08/08/20: 32 seconds via 9 hole peg 40th visit: Left hand: 35 sec.; 07/04/21: L hand 28 sec   ? Time 12   ? Period Weeks   ? Status On-going   ? Target Date 08/21/21   ?  ? OT SHORT TERM GOAL #7  ? Title Pt will demonstrate at least 25% finger flexion/ extension in RUE in prep for functional use.   ? Baseline Limited PROM, and active digit flexion, and extension. Improved AROM for right 2nd, and 3rd digits. significant limitations with the 4th, and 5th digits. 08/08/20: AROM digit MCP 2nd (80*), 3rd (85*), 4th (80*), 5th (90*); 2/21/2: Since Botox injection in Dec, R hand has had limited active movement; see note for details   ? Time 4   ? Period Weeks   ? Status On-going   ? Target Date 09/05/20   ? ?  ?  ? ?  ? ? ? ? OT Long Term Goals - 07/05/21 1417   ? ?  ? OT LONG TERM GOAL #3  ? Title Pt will use RUE as a stabilizer/ gross A at least 30 % of the time for ADLs/ IADLs.   ? Baseline Pt. is using her right hand as a gross stabilizer 15% of the time. 08/08/20: requires MAX cues; 03/07/21 Pt using R hand as a gross stabilizer at least 25% of the time. 40th visit: Pt. has difficulty using her right hand  as a gross stabilizer; 07/04/21: Pt uses RUE as a stabilizer 40-50% of the time for ADLs/IADLs.   ? Time 12   ? Period Weeks   ? Status On-going   ? Target Date 08/21/21   ?  ? OT LONG TERM GOAL #4  ? Title  Pt will increase LUE grip strength to 25 lbs or greater for increased ease with ADLs.   ? Baseline 12/13/20: 24# left, 1# right 10/31/2020: Pt. continues to present with limited left grip. Pt. has improved with

## 2021-09-28 ENCOUNTER — Encounter: Payer: Self-pay | Admitting: Occupational Therapy

## 2021-09-28 ENCOUNTER — Ambulatory Visit: Payer: Medicare PPO | Admitting: Occupational Therapy

## 2021-09-28 DIAGNOSIS — I69159 Hemiplegia and hemiparesis following nontraumatic intracerebral hemorrhage affecting unspecified side: Secondary | ICD-10-CM

## 2021-09-28 DIAGNOSIS — R278 Other lack of coordination: Secondary | ICD-10-CM

## 2021-09-28 DIAGNOSIS — M6281 Muscle weakness (generalized): Secondary | ICD-10-CM | POA: Diagnosis not present

## 2021-09-30 NOTE — Therapy (Signed)
[name] [facility] SERVICES [address] [place], [place], [address] Phone: [phone]   Fax:  [phone]  Occupational Therapy Treatment/Progress Update Reporting period from 08/17/2021 to 09/28/2021  Patient Details  Name: Erika Stanton MRN: [id number] Date of Birth: 11/20/1955 No data recorded  Encounter Date: 09/28/2021   OT End of Session - 09/30/21 1520     Visit Number 140    Number of Visits 148    Date for OT Re-Evaluation 11/09/21    Authorization Type Progress report period starting 07/06/21    Authorization Time Period Medicare    OT Start Time 1300    OT Stop Time 1346    OT Time Calculation (min) 46 min    Activity Tolerance Patient tolerated treatment well    Behavior During Therapy [facility] for tasks assessed/performed             Past Medical History:  Diagnosis Date   Aneurysm ([place])    Melena     Past Surgical History:  Procedure Laterality Date   CHOLECYSTECTOMY     COLONOSCOPY  11/2017   at [facility]. no recurrent polyps.  suggest repeat surveillance study 11/2022.     COLONOSCOPY W/ POLYPECTOMY  06/2014   Dr Roxy Manns at [facility].  3 adenomatoous polyps, anal fissure.     ESOPHAGOGASTRODUODENOSCOPY (EGD) WITH PROPOFOL N/A 01/27/2020   Procedure: ESOPHAGOGASTRODUODENOSCOPY (EGD) WITH PROPOFOL;  Surgeon: Mauri Pole, MD;  Location: [facility] [facility];  Service: Endoscopy;  Laterality: N/A;   IR 3D INDEPENDENT WKST  01/06/2020   IR ANGIO INTRA EXTRACRAN SEL INTERNAL CAROTID BILAT MOD SED  01/06/2020   IR ANGIO VERTEBRAL SEL VERTEBRAL UNI L MOD SED  01/06/2020   IR ANGIOGRAM FOLLOW UP STUDY  01/06/2020   IR ANGIOGRAM FOLLOW UP STUDY  01/06/2020   IR ANGIOGRAM FOLLOW UP STUDY  01/06/2020   IR ANGIOGRAM FOLLOW UP STUDY  01/06/2020   IR ANGIOGRAM FOLLOW UP STUDY  01/06/2020   IR ANGIOGRAM FOLLOW UP STUDY  01/06/2020   IR ANGIOGRAM FOLLOW UP STUDY  01/06/2020   IR ANGIOGRAM FOLLOW UP STUDY  01/06/2020   IR ANGIOGRAM FOLLOW UP STUDY   01/06/2020   IR ANGIOGRAM FOLLOW UP STUDY  01/06/2020   IR NEURO EACH ADD'L AFTER BASIC UNI RIGHT (MS)  01/06/2020   IR TRANSCATH/EMBOLIZ  01/06/2020   RADIOLOGY WITH ANESTHESIA N/A 01/06/2020   Procedure: IR WITH ANESTHESIA FOR ANEURYSM;  Surgeon: Consuella Lose, MD;  Location: [place];  Service: Radiology;  Laterality: N/A;   TUBAL LIGATION      There were no vitals filed for this visit.   Subjective Assessment - 09/30/21 1519     Subjective  Pt reports she and her husband will be traveling some soon in June and has modified her therapy schedule.    Patient is accompanied by: Family member    Pertinent History ICH 01/06/20, Aneurysm. PMH: OA bilateral knees and hands    Repetition Decreases Symptoms    Patient Stated Goals Pt would like to be as independent as possible.    Currently in Pain? Yes    Pain Score 1     Pain Location Hand    Pain Orientation Right    Pain Descriptors / Indicators Aching;Tightness    Pain Type Chronic pain    Pain Onset More than a month ago    Pain Frequency Intermittent    Pain Onset More than a month ago  Occupational Therapy Treatment: Moist heat applied to R shoulder and R hand x 5 min for pain reduction/muscle relaxation in prep for ROM and strengthening exercises.   Therapeutic Exercise: Performed slow, prolonged stretch for R hand IF and LF MCP, PIP, DIP flexion, and MCP and PIP ext of digits 2-5 with good tolerance.  Passive stretching performed for R shoulder flex/abd, horiz abd/add, and prolonged stretch for R shoulder ER, working to increase R shoulder mobility for UB ADLs.  Ace wrapped R hand digits into flexion around palm sized ball to promote prolonged stretch.  Performed target reaching with right UE to promote ROM and strength in right UE while hand is wrapped in flexion.    Progress update with goals updated to reflect progress. Pt able to complete most of her self care tasks with modified techniques and using left hand for  most of tasks.  She is able to step in a sports bra but requires assist if bra is a fastening one.  She is unable to do her pony tail herself and requires assist from her husband and would like to be able to perform herself.    Response to tx:  Pt has continued to make progress in right shoulder with ROM and strength.  She is able to use it grossly as an assist such as holding items close to her body.  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  Therapist to try fitting for oval rings next session for PIP joint.  Pt has not used flexion glove on a regular basis, encouraged her to try daily for wearing for short periods while she is watching TV.  Pt continues to benefit from skilled OT services to maximize safety and independence in necessary daily tasks.                      OT Education - 09/30/21 1519     Education Details ROM, grasping, progress    Person(s) Educated Patient    Methods Explanation;Verbal cues;Demonstration    Comprehension Verbalized understanding;Returned demonstration;Tactile cues required;Verbal cues required;Need further instruction              OT Short Term Goals - 07/04/21 1355       OT SHORT TERM GOAL #1   Title I with initial HEP.    Time 6    Period Weeks    Status Achieved    Target Date 04/13/20      OT SHORT TERM GOAL #4   Title Pt will demonstrate 60* A/ROM shoulder flexion for RUE with pain less than or equal to 4/10.    Baseline Pt. presents with limited isolated  right shoulder flexion with pain continues to be 2. 08/08/20: 40* AROM with 3/10 pain; 03/07/21: Pt demos 75* active shoulder flexion with 2/10 pain or less. 40th visit: Pt. continues to present with limited shoulder ROM, and pain has decreased; 07/04/21 R shoulder active flexion 85, 3/10 pain.    Time 4    Period Weeks    Status On-going    Target Date 08/21/21      OT SHORT TERM GOAL #5    Title Pt will demonstrate improved LUE fine motor coordination for ADLs as evidenced by decreasing 9 hole peg test score to 25 secs or less.    Baseline 10/31/2020: Left FMC: 32 sec. Left FMC 41 sec visa the 9 hole peg test. 08/08/20: 32 seconds via  9 hole peg 40th visit: Left hand: 35 sec.; 07/04/21: L hand 28 sec    Time 12    Period Weeks    Status On-going    Target Date 08/21/21      OT SHORT TERM GOAL #7   Title Pt will demonstrate at least 25% finger flexion/ extension in RUE in prep for functional use.    Baseline Limited PROM, and active digit flexion, and extension. Improved AROM for right 2nd, and 3rd digits. significant limitations with the 4th, and 5th digits. 08/08/20: AROM digit MCP 2nd (80*), 3rd (85*), 4th (80*), 5th (90*); 2/21/2: Since Botox injection in Dec, R hand has had limited active movement; see note for details    Time 4    Period Weeks    Status On-going    Target Date 09/05/20               OT Long Term Goals - 09/30/21 1608       OT LONG TERM GOAL #1   Title Pt will perform all basic ADLs with supervision    Baseline 01/24/21 assist with bra 12/13/2020 unchanged 6/202/2022: Pt. is able to donn stretchy shirts, donn slide on shoes, donning pants. Pt. has difficylty with managing a bra,shirt buttons, and pant zipper/button.Pt. requires minA from her husband following multiple recent surgeries. Pt. requires minA from her husband. 08/08/20: Pt continues to require MIN A for grooming and dressing tasks; 03/07/21: pt performs UB/LB bathing and dressing with set up (assist with clothing fasteners); min A for grooming d/t unable to manage hair with 2 hands 40th visit: Pt. conitnues to require minA  for self-grooming, Pt. continues to perform UB/LB bathing/dressing, assist with fasteners. 09/28/2021: assist for fastening bra and to put hair in ponytail    Time 12    Period Weeks    Status On-going    Target Date 11/09/21      OT LONG TERM GOAL #2   Title Pt will  demonstrate 60* RUE shoulder flexion in prep for functional reach with pain less than or equal to 3/10.    Baseline 12/13/20:  shoulder flexion right 64 degrees 10/31/20: Pt. continues to present with limited AROM for isolated  right shoulder flexion with pain. 08/08/20: R AROM Shoulder flexion - 45* c 3/10 pain.. 09/28/21: 98 degrees shoulder flexion    Time 12    Period Weeks    Status Achieved    Target Date 11/09/21      OT LONG TERM GOAL #3   Title Pt will use RUE as a stabilizer/ gross A at least 30 % of the time for ADLs/ IADLs.    Baseline Pt. is using her right hand as a gross stabilizer 15% of the time. 08/08/20: requires MAX cues; 03/07/21 Pt using R hand as a gross stabilizer at least 25% of the time. 40th visit: Pt. has difficulty using her right hand  as a gross stabilizer. 09/28/2021: using about 25% of time as gross assist depending on task    Time 12    Period Weeks    Status On-going    Target Date 11/09/21      OT LONG TERM GOAL #4   Title Pt will increase LUE grip strength to 25 lbs or greater for increased ease with ADLs.    Baseline 12/13/20: 24# left, 1# right 10/31/2020: Pt. continues to present with limited left grip. Pt. has improved with left grip strength. Pt.continues to work towards 25#. 08/08/20: L grip 19#;  03/07/21: L grip 21 lbs (R 1 lb) 05/18/2021: Left grip strength: 30#09/28/2021:  grip limited in right measures 1#    Time 12    Period Weeks    Status On-going    Target Date 11/09/21      OT LONG TERM GOAL #5   Title 10/31/2020: Pt. is now perfroming laundry, loading the dishwasher, assists some with cooking, light meal preparation, setting the table    Baseline 12/13/2020:  continues to assist as needed but uses left hand .  10/13/20: Pt. assists with filling the dishwasher, and performing laundry. Pt. continues to try to help when she can, however her husband mostly performs these tasks. 08/08/20: Husband performs; 03/07/21: Pt assists with laundry, loading dishwasher,  light cooking/meal prep; mostly using LUE. 05/18/2021: pt. continues to assist with laundry skills, loading the dishwasher, light cooking using the left hand.  09/28/2021: using right hand at times as a stabilizer but still requires assist for tasks and using left in most tasks.    Time 12    Period Weeks    Status On-going    Target Date 11/09/21      OT LONG TERM GOAL #6   Title Pt will demonstrate ability to grasp/ release a cup 2/3 trials with RUE.    Baseline 12/13/20:  Continues to work on consistency with grasp and release 10/31/2020: Pt. continues to initiate grasp, and active release of objects, however is unable to grasp/release a cup. 08/08/20: Able to grasp, unable to release x1 trial; 03/05/21: Able to grasp/release cup with min A to keep cup from tipping over.05/18/2021: Pt. is unable to grasp items secondary to changes in the right hand following Botox injections. Pt. has 2nd,a nd 3rd digit PIP extensor tightness. 09/28/2021: pt with extensor tone in index and MF, difficulty with grasping patterns.    Time 12    Period Weeks    Status On-going    Target Date 11/09/21      OT LONG TERM GOAL #7   Title Pt will demonstrate the ability to place clothing on hangers with use of bilateral UE and modified technique.    Baseline unable; 03/07/21: min-mod A, 09/28/2021: min assist    Time 12    Period Weeks    Status On-going    Target Date 11/09/21      OT LONG TERM GOAL #8   Title I with updated HEP.    Baseline 12/13/2020: Continual changes to HEP 09/12/2020: Pt. continues to require assist for her HEP. Pt. requires assist, and cuing for HEP. 08/08/20: reports completing with husband assist for technique; 03/07/21: min vc. 05/18/2021: Pt. requires assist with HEPs, Husband assists with HEP. 09/28/2021: continue to update HEP    Time 12    Period Weeks    Status Partially Met    Target Date 11/09/21      OT LONG TERM GOAL  #9   TITLE Pt will demonstrate the ability to don and doff robe after  shower with modified independence    Baseline unable; 03/07/21: pt reports she avoids her robe (too heavy); uses towel instead. 09/28/2021: new robe which is lighter weight    Time 12    Period Weeks    Status On-going    Target Date 11/09/21      OT LONG TERM GOAL  #10   TITLE Pt will demonstrate the ability to perform zippers with modified independence and use of adaptive equipment as needed.    Baseline difficulty; 03/07/21:  cues to modify zippers with string loops (has not yet tried) 05/18/2021: TBD, 09/28/2021: continued difficulty and requires assist    Time 12    Period Weeks    Status On-going    Target Date 11/09/21                   Plan - 09/30/21 1520     Clinical Impression Statement Pt has continued to make progress in right shoulder with ROM and strength.  She is able to use it grossly as an assist such as holding items close to her body.  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  Therapist to try fitting for oval rings next session for PIP joint.  Pt has not used flexion glove on a regular basis, encouraged her to try daily for wearing for short periods while she is watching TV.  Pt continues to benefit from skilled OT services to maximize safety and independence in necessary daily tasks.    OT Occupational Profile and History Detailed Assessment- Review of Records and additional review of physical, cognitive, psychosocial history related to current functional performance    Occupational performance deficits (Please refer to evaluation for details): ADL's;IADL's;Rest and Sleep;Work;Play;Leisure    Body Structure / Function / Physical Skills ADL;Balance;Coordination;Decreased knowledge of precautions;Decreased knowledge of use of DME;Dexterity;Edema;Mobility;Tone;Strength;IADL;Sensation;GMC;Gait;ROM;FMC;Flexibility;Pain;Vision;UE functional use;Endurance    Cognitive Skills  Attention;Perception;Problem Solve;Safety Awareness;Thought    Rehab Potential Good    Clinical Decision Making Several treatment options, min-mod task modification necessary    Comorbidities Affecting Occupational Performance: None    Modification or Assistance to Complete Evaluation  Min-Moderate modification of tasks or assist with assess necessary to complete eval    OT Frequency 2x / week    OT Treatment/Interventions Self-care/ADL training;Ultrasound;Energy conservation;Visual/perceptual remediation/compensation;Patient/family education;DME and/or AE instruction;Aquatic Therapy;Paraffin;Gait Training;Passive range of motion;Balance training;Fluidtherapy;Electrical Stimulation;Functional Mobility Training;Splinting;Moist Heat;Therapeutic exercise;Manual Therapy;Cognitive remediation/compensation;Manual lymph drainage;Neuromuscular education;Coping strategies training    Consulted and Agree with Plan of Care Patient    Family Member Consulted Spose present during the session.             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, unspecified laterality [facility])    Problem List Patient Active Problem List   Diagnosis Date Noted   Dyspareunia in female 04/05/2020   History of abnormal cervical Pap smear 03/10/2020   GIB (gastrointestinal bleeding) 02/28/2020   Elevated BUN    Prediabetes    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   Cerebral vasospasm    Sinus tachycardia    Dysphagia, post-stroke    Thrombocytopenia ([facility])    Acute blood loss anemia    Brain aneurysm    ICH (intracerebral hemorrhage) ([place]) 01/05/2020   History of adenomatous polyp of colon 02/07/2016   Anatomical narrow angle, bilateral 12/14/2014   Fissure in ano 11/11/2014   Hemorrhoids 11/11/2014   Constipation 11/19/2013   GERD (gastroesophageal reflux disease) 03/24/2013   Irritable bowel syndrome with diarrhea 03/24/2013   Herpes  zoster 05/14/2005   Abnormal Pap smear of cervix 05/15/1983   History of cervical dysplasia 05/15/1983   Erika Stanton, OTR/L, CLT  Erika Stanton, OT 09/30/2021, 4:24 PM  [place] MAIN [facility] SERVICES [address] [place], [place], [address] Phone: [phone]   Fax:  [phone]  Name: KAM KUSHNIR MRN: [id number] Date of Birth: [birth year]

## 2021-10-02 ENCOUNTER — Other Ambulatory Visit: Payer: Self-pay

## 2021-10-02 MED ORDER — GABAPENTIN 300 MG PO CAPS: ORAL_CAPSULE | ORAL | 5 refills | Status: AC

## 2021-10-03 ENCOUNTER — Ambulatory Visit: Payer: Medicare PPO | Admitting: Occupational Therapy

## 2021-10-03 ENCOUNTER — Encounter: Payer: Self-pay | Admitting: Occupational Therapy

## 2021-10-03 DIAGNOSIS — M6281 Muscle weakness (generalized): Secondary | ICD-10-CM | POA: Diagnosis not present

## 2021-10-03 DIAGNOSIS — R278 Other lack of coordination: Secondary | ICD-10-CM

## 2021-10-03 NOTE — Therapy (Signed)
[place] MAIN [facility] SERVICES [address] [place], [place], [address] Phone: [phone]   Fax:  [phone]  Occupational Therapy Treatment  Patient Details  Name: Erika Stanton MRN: [id number] Date of Birth: August 02, 1955 No data recorded  Encounter Date: 10/03/2021   OT End of Session - 10/03/21 1350     Visit Number 141    Number of Visits 148    Date for OT Re-Evaluation 11/09/21    Authorization Type Progress report period starting 07/06/21    OT Start Time 1300    OT Stop Time 1345    OT Time Calculation (min) 45 min    Activity Tolerance Patient tolerated treatment well    Behavior During Therapy [facility] for tasks assessed/performed             Past Medical History:  Diagnosis Date   Aneurysm ([place])    Melena     Past Surgical History:  Procedure Laterality Date   CHOLECYSTECTOMY     COLONOSCOPY  11/2017   at [facility]. no recurrent polyps.  suggest repeat surveillance study 11/2022.     COLONOSCOPY W/ POLYPECTOMY  06/2014   Dr Roxy Manns at [facility].  3 adenomatoous polyps, anal fissure.     ESOPHAGOGASTRODUODENOSCOPY (EGD) WITH PROPOFOL N/A 01/27/2020   Procedure: ESOPHAGOGASTRODUODENOSCOPY (EGD) WITH PROPOFOL;  Surgeon: Mauri Pole, MD;  Location: [place] ENDOSCOPY;  Service: Endoscopy;  Laterality: N/A;   IR 3D INDEPENDENT WKST  01/06/2020   IR ANGIO INTRA EXTRACRAN SEL INTERNAL CAROTID BILAT MOD SED  01/06/2020   IR ANGIO VERTEBRAL SEL VERTEBRAL UNI L MOD SED  01/06/2020   IR ANGIOGRAM FOLLOW UP STUDY  01/06/2020   IR ANGIOGRAM FOLLOW UP STUDY  01/06/2020   IR ANGIOGRAM FOLLOW UP STUDY  01/06/2020   IR ANGIOGRAM FOLLOW UP STUDY  01/06/2020   IR ANGIOGRAM FOLLOW UP STUDY  01/06/2020   IR ANGIOGRAM FOLLOW UP STUDY  01/06/2020   IR ANGIOGRAM FOLLOW UP STUDY  01/06/2020   IR ANGIOGRAM FOLLOW UP STUDY  01/06/2020   IR ANGIOGRAM FOLLOW UP STUDY  01/06/2020   IR ANGIOGRAM FOLLOW UP STUDY  01/06/2020   IR NEURO EACH ADD'L AFTER BASIC UNI RIGHT  (MS)  01/06/2020   IR TRANSCATH/EMBOLIZ  01/06/2020   RADIOLOGY WITH ANESTHESIA N/A 01/06/2020   Procedure: IR WITH ANESTHESIA FOR ANEURYSM;  Surgeon: Consuella Lose, MD;  Location: [place];  Service: Radiology;  Laterality: N/A;   TUBAL LIGATION      There were no vitals filed for this visit.   Subjective Assessment - 10/03/21 1349     Subjective  Pt reports she and her husband will be traveling to [place] next week    Patient is accompanied by: Family member    Pertinent History ICH 01/06/20, Aneurysm. PMH: OA bilateral knees and hands    Repetition Decreases Symptoms    Patient Stated Goals Pt would like to be as independent as possible.    Currently in Pain? No/denies            OT TREATMENT     Manual therapy;   Pt. tolerated soft tissue massage to the volar, and dorsal aspect of the right 2nd, and 3rd digit PIPs.  Manual therapy was performed independent of, and in preparation for there. Ex.    Therapeutic Exercise:   Pt. tolerated PROM to the right 2nd, and 3rd digits to increase digit MP, PIP, and DIP flexion in preparation for formulating a fist. Pt. Tolerated  PROM, and AAROM for 5th, and 5th digit extension.   Neuromuscular re-education:   Pt. worked on right hand [facility] skills using a 3pt. grasp pattern for grasping 1" circular pegs from a pegboard placed flat, followed by being placed at a vertical angle to encourage more wrist extension. Pt. worked on reaching up to place them into a container in front of her, and out to the side to promote shoulder flexion, and abduction.  Pt. tolerated moist heat, and manual therapy well prior to ROM. Pt. tolerated ROM with emphasis placed on PROM for right 2nd, and 3rd digit MP, PIP, and DIP flexion 2/2 digit extensor tightness, and 4th & 5th digit extension 2/2 flexor tightness. Pt. Presented with improved 3pt. grasp on the circular pegs when the pegboard was placed at vertical incline. Pt. continues to work on improving ROM with  right 2nd, and 3rd digit MP, PIP, and DIP flexion, and overall RUE functioning to improve engagement in daily ADL, and IADL tasks. Pt. continues to work on improving RUE functioning in order to improve RUE functional reaching, and functional grasp patterns.                                  OT Education - 10/03/21 1350     Education Details ROM, grasping, progress    Person(s) Educated Patient    Methods Explanation;Verbal cues;Demonstration    Comprehension Verbalized understanding;Returned demonstration;Tactile cues required;Verbal cues required;Need further instruction              OT Short Term Goals - 07/04/21 1355       OT SHORT TERM GOAL #1   Title I with initial HEP.    Time 6    Period Weeks    Status Achieved    Target Date 04/13/20      OT SHORT TERM GOAL #4   Title Pt will demonstrate 60* A/ROM shoulder flexion for RUE with pain less than or equal to 4/10.    Baseline Pt. presents with limited isolated  right shoulder flexion with pain continues to be 2. 08/08/20: 40* AROM with 3/10 pain; 03/07/21: Pt demos 75* active shoulder flexion with 2/10 pain or less. 40th visit: Pt. continues to present with limited shoulder ROM, and pain has decreased; 07/04/21 R shoulder active flexion 85, 3/10 pain.    Time 4    Period Weeks    Status On-going    Target Date 08/21/21      OT SHORT TERM GOAL #5   Title Pt will demonstrate improved LUE fine motor coordination for ADLs as evidenced by decreasing 9 hole peg test score to 25 secs or less.    Baseline 10/31/2020: Left FMC: 32 sec. Left FMC 41 sec visa the 9 hole peg test. 08/08/20: 32 seconds via 9 hole peg 40th visit: Left hand: 35 sec.; 07/04/21: L hand 28 sec    Time 12    Period Weeks    Status On-going    Target Date 08/21/21      OT SHORT TERM GOAL #7   Title Pt will demonstrate at least 25% finger flexion/ extension in RUE in prep for functional use.    Baseline Limited PROM, and active digit  flexion, and extension. Improved AROM for right 2nd, and 3rd digits. significant limitations with the 4th, and 5th digits. 08/08/20: AROM digit MCP 2nd (80*), 3rd (85*), 4th (80*), 5th (90*); 2/21/2: Since Botox injection in  Dec, R hand has had limited active movement; see note for details    Time 4    Period Weeks    Status On-going    Target Date 09/05/20               OT Long Term Goals - 09/30/21 1608       OT LONG TERM GOAL #1   Title Pt will perform all basic ADLs with supervision    Baseline 01/24/21 assist with bra 12/13/2020 unchanged 6/202/2022: Pt. is able to donn stretchy shirts, donn slide on shoes, donning pants. Pt. has difficylty with managing a bra,shirt buttons, and pant zipper/button.Pt. requires minA from her husband following multiple recent surgeries. Pt. requires minA from her husband. 08/08/20: Pt continues to require MIN A for grooming and dressing tasks; 03/07/21: pt performs UB/LB bathing and dressing with set up (assist with clothing fasteners); min A for grooming d/t unable to manage hair with 2 hands 40th visit: Pt. conitnues to require minA  for self-grooming, Pt. continues to perform UB/LB bathing/dressing, assist with fasteners. 09/28/2021: assist for fastening bra and to put hair in ponytail    Time 12    Period Weeks    Status On-going    Target Date 11/09/21      OT LONG TERM GOAL #2   Title Pt will demonstrate 60* RUE shoulder flexion in prep for functional reach with pain less than or equal to 3/10.    Baseline 12/13/20:  shoulder flexion right 64 degrees 10/31/20: Pt. continues to present with limited AROM for isolated  right shoulder flexion with pain. 08/08/20: R AROM Shoulder flexion - 45* c 3/10 pain.. 09/28/21: 98 degrees shoulder flexion    Time 12    Period Weeks    Status Achieved    Target Date 11/09/21      OT LONG TERM GOAL #3   Title Pt will use RUE as a stabilizer/ gross A at least 30 % of the time for ADLs/ IADLs.    Baseline Pt. is using  her right hand as a gross stabilizer 15% of the time. 08/08/20: requires MAX cues; 03/07/21 Pt using R hand as a gross stabilizer at least 25% of the time. 40th visit: Pt. has difficulty using her right hand  as a gross stabilizer. 09/28/2021: using about 25% of time as gross assist depending on task    Time 12    Period Weeks    Status On-going    Target Date 11/09/21      OT LONG TERM GOAL #4   Title Pt will increase LUE grip strength to 25 lbs or greater for increased ease with ADLs.    Baseline 12/13/20: 24# left, 1# right 10/31/2020: Pt. continues to present with limited left grip. Pt. has improved with left grip strength. Pt.continues to work towards 25#. 08/08/20: L grip 19#; 03/07/21: L grip 21 lbs (R 1 lb) 05/18/2021: Left grip strength: 30#09/28/2021:  grip limited in right measures 1#    Time 12    Period Weeks    Status On-going    Target Date 11/09/21      OT LONG TERM GOAL #5   Title 10/31/2020: Pt. is now perfroming laundry, loading the dishwasher, assists some with cooking, light meal preparation, setting the table    Baseline 12/13/2020:  continues to assist as needed but uses left hand .  10/13/20: Pt. assists with filling the dishwasher, and performing laundry. Pt. continues to try to help when she can,  however her husband mostly performs these tasks. 08/08/20: Husband performs; 03/07/21: Pt assists with laundry, loading dishwasher, light cooking/meal prep; mostly using LUE. 05/18/2021: pt. continues to assist with laundry skills, loading the dishwasher, light cooking using the left hand.  09/28/2021: using right hand at times as a stabilizer but still requires assist for tasks and using left in most tasks.    Time 12    Period Weeks    Status On-going    Target Date 11/09/21      OT LONG TERM GOAL #6   Title Pt will demonstrate ability to grasp/ release a cup 2/3 trials with RUE.    Baseline 12/13/20:  Continues to work on consistency with grasp and release 10/31/2020: Pt. continues to  initiate grasp, and active release of objects, however is unable to grasp/release a cup. 08/08/20: Able to grasp, unable to release x1 trial; 03/05/21: Able to grasp/release cup with min A to keep cup from tipping over.05/18/2021: Pt. is unable to grasp items secondary to changes in the right hand following Botox injections. Pt. has 2nd,a nd 3rd digit PIP extensor tightness. 09/28/2021: pt with extensor tone in index and MF, difficulty with grasping patterns.    Time 12    Period Weeks    Status On-going    Target Date 11/09/21      OT LONG TERM GOAL #7   Title Pt will demonstrate the ability to place clothing on hangers with use of bilateral UE and modified technique.    Baseline unable; 03/07/21: min-mod A, 09/28/2021: min assist    Time 12    Period Weeks    Status On-going    Target Date 11/09/21      OT LONG TERM GOAL #8   Title I with updated HEP.    Baseline 12/13/2020: Continual changes to HEP 09/12/2020: Pt. continues to require assist for her HEP. Pt. requires assist, and cuing for HEP. 08/08/20: reports completing with husband assist for technique; 03/07/21: min vc. 05/18/2021: Pt. requires assist with HEPs, Husband assists with HEP. 09/28/2021: continue to update HEP    Time 12    Period Weeks    Status Partially Met    Target Date 11/09/21      OT LONG TERM GOAL  #9   TITLE Pt will demonstrate the ability to don and doff robe after shower with modified independence    Baseline unable; 03/07/21: pt reports she avoids her robe (too heavy); uses towel instead. 09/28/2021: new robe which is lighter weight    Time 12    Period Weeks    Status On-going    Target Date 11/09/21      OT LONG TERM GOAL  #10   TITLE Pt will demonstrate the ability to perform zippers with modified independence and use of adaptive equipment as needed.    Baseline difficulty; 03/07/21: cues to modify zippers with string loops (has not yet tried) 05/18/2021: TBD, 09/28/2021: continued difficulty and requires assist     Time 12    Period Weeks    Status On-going    Target Date 11/09/21                   Plan - 10/03/21 1351     Clinical Impression Statement Pt. tolerated moist heat, and manual therapy well prior to ROM. Pt. tolerated ROM with emphasis placed on PROM for right 2nd, and 3rd digit MP, PIP, and DIP flexion 2/2 digit extensor tightness, and 4th & 5th digit extension  2/2 flexor tightness. Pt. Presented with improved 3pt. grasp on the circular pegs when the pegboard was placed at vertical incline. Pt. continues to work on improving ROM with right 2nd, and 3rd digit MP, PIP, and DIP flexion, and overall RUE functioning to improve engagement in daily ADL, and IADL tasks. Pt. continues to work on improving RUE functioning in order to improve RUE functional reaching, and functional grasp patterns.             OT Occupational Profile and History Detailed Assessment- Review of Records and additional review of physical, cognitive, psychosocial history related to current functional performance    Occupational performance deficits (Please refer to evaluation for details): ADL's;IADL's;Rest and Sleep;Work;Play;Leisure    Body Structure / Function / Physical Skills ADL;Balance;Coordination;Decreased knowledge of precautions;Decreased knowledge of use of DME;Dexterity;Edema;Mobility;Tone;Strength;IADL;Sensation;GMC;Gait;ROM;FMC;Flexibility;Pain;Vision;UE functional use;Endurance    Cognitive Skills Attention;Perception;Problem Solve;Safety Awareness;Thought    Rehab Potential Good    Clinical Decision Making Several treatment options, min-mod task modification necessary    Comorbidities Affecting Occupational Performance: None    Modification or Assistance to Complete Evaluation  Min-Moderate modification of tasks or assist with assess necessary to complete eval    OT Frequency 2x / week    OT Treatment/Interventions Self-care/ADL training;Ultrasound;Energy conservation;Visual/perceptual  remediation/compensation;Patient/family education;DME and/or AE instruction;Aquatic Therapy;Paraffin;Gait Training;Passive range of motion;Balance training;Fluidtherapy;Electrical Stimulation;Functional Mobility Training;Splinting;Moist Heat;Therapeutic exercise;Manual Therapy;Cognitive remediation/compensation;Manual lymph drainage;Neuromuscular education;Coping strategies training    Consulted and Agree with Plan of Care Patient    Family Member Consulted Spose present during the session.             Patient will benefit from skilled therapeutic intervention in order to improve the following deficits and impairments:   Body Structure / Function / Physical Skills: ADL, Balance, Coordination, Decreased knowledge of precautions, Decreased knowledge of use of DME, Dexterity, Edema, Mobility, Tone, Strength, IADL, Sensation, GMC, Gait, ROM, FMC, Flexibility, Pain, Vision, UE functional use, Endurance Cognitive Skills: Attention, Perception, Problem Solve, Safety Awareness, Thought     Visit Diagnosis: Muscle weakness (generalized)  Other lack of coordination    Problem List Patient Active Problem List   Diagnosis Date Noted   Dyspareunia in female 04/05/2020   History of abnormal cervical Pap smear 03/10/2020   GIB (gastrointestinal bleeding) 02/28/2020   Elevated BUN    Prediabetes    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   Cerebral vasospasm    Sinus tachycardia    Dysphagia, post-stroke    Thrombocytopenia ([facility])    Acute blood loss anemia    Brain aneurysm    ICH (intracerebral hemorrhage) ([place]) 01/05/2020   History of adenomatous polyp of colon 02/07/2016   Anatomical narrow angle, 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 [place], [place], [address] Phone: [phone]   Fax:  [phone]  Name: ADELEIGH BARLETTA MRN: [id number] Date of Birth: 1955/08/27

## 2021-10-05 ENCOUNTER — Encounter: Payer: Self-pay | Admitting: Occupational Therapy

## 2021-10-05 ENCOUNTER — Ambulatory Visit: Payer: Medicare PPO | Admitting: Occupational Therapy

## 2021-10-05 DIAGNOSIS — I69159 Hemiplegia and hemiparesis following nontraumatic intracerebral hemorrhage affecting unspecified side: Secondary | ICD-10-CM

## 2021-10-05 DIAGNOSIS — M79641 Pain in right hand: Secondary | ICD-10-CM

## 2021-10-05 DIAGNOSIS — R278 Other lack of coordination: Secondary | ICD-10-CM

## 2021-10-05 DIAGNOSIS — M6281 Muscle weakness (generalized): Secondary | ICD-10-CM | POA: Diagnosis not present

## 2021-10-05 NOTE — Therapy (Signed)
[place] MAIN [facility] SERVICES [address] [place], [place], [address] Phone: [phone]   Fax:  [phone]  Occupational Therapy Treatment  Patient Details  Name: Erika Stanton MRN: [id number] Date of Birth: 1955-08-22 No data recorded  Encounter Date: 10/05/2021   OT End of Session - 10/05/21 1935     Visit Number 142    Number of Visits 148    Date for OT Re-Evaluation 11/09/21    Authorization Type Progress report period starting 07/06/21    OT Start Time 1300    OT Stop Time 1346    OT Time Calculation (min) 46 min    Activity Tolerance Patient tolerated treatment well    Behavior During Therapy [facility] for tasks assessed/performed             Past Medical History:  Diagnosis Date   Aneurysm ([place])    Melena     Past Surgical History:  Procedure Laterality Date   CHOLECYSTECTOMY     COLONOSCOPY  11/2017   at [facility]. no recurrent polyps.  suggest repeat surveillance study 11/2022.     COLONOSCOPY W/ POLYPECTOMY  06/2014   Dr Roxy Manns at [facility].  3 adenomatoous polyps, anal fissure.     ESOPHAGOGASTRODUODENOSCOPY (EGD) WITH PROPOFOL N/A 01/27/2020   Procedure: ESOPHAGOGASTRODUODENOSCOPY (EGD) WITH PROPOFOL;  Surgeon: Mauri Pole, MD;  Location: [place] [facility];  Service: Endoscopy;  Laterality: N/A;   IR 3D INDEPENDENT WKST  01/06/2020   IR ANGIO INTRA EXTRACRAN SEL INTERNAL CAROTID BILAT MOD SED  01/06/2020   IR ANGIO VERTEBRAL SEL VERTEBRAL UNI L MOD SED  01/06/2020   IR ANGIOGRAM FOLLOW UP STUDY  01/06/2020   IR ANGIOGRAM FOLLOW UP STUDY  01/06/2020   IR ANGIOGRAM FOLLOW UP STUDY  01/06/2020   IR ANGIOGRAM FOLLOW UP STUDY  01/06/2020   IR ANGIOGRAM FOLLOW UP STUDY  01/06/2020   IR ANGIOGRAM FOLLOW UP STUDY  01/06/2020   IR ANGIOGRAM FOLLOW UP STUDY  01/06/2020   IR ANGIOGRAM FOLLOW UP STUDY  01/06/2020   IR ANGIOGRAM FOLLOW UP STUDY  01/06/2020   IR ANGIOGRAM FOLLOW UP STUDY  01/06/2020   IR NEURO EACH ADD'L AFTER BASIC UNI RIGHT  (MS)  01/06/2020   IR TRANSCATH/EMBOLIZ  01/06/2020   RADIOLOGY WITH ANESTHESIA N/A 01/06/2020   Procedure: IR WITH ANESTHESIA FOR ANEURYSM;  Surgeon: Consuella Lose, MD;  Location: [place];  Service: Radiology;  Laterality: N/A;   TUBAL LIGATION      There were no vitals filed for this visit.   Subjective Assessment - 10/05/21 1933     Subjective  Pt requesting a print out of her schedule since she will be away for a couple weeks in June on vacation.    Patient is accompanied by: Family member    Pertinent History ICH 01/06/20, Aneurysm. PMH: OA bilateral knees and hands    Repetition Decreases Symptoms    Patient Stated Goals Pt would like to be as independent as possible.    Currently in Pain? Yes    Pain Score 2     Pain Location Hand    Pain Orientation Right    Pain Descriptors / Indicators Tightness;Aching    Pain Type Chronic pain    Pain Onset More than a month ago    Pain Frequency Intermittent             Rationale for Evaluation and Treatment Rehabilitation  Occupational Therapy Treatment: Moist heat applied to R shoulder  and R hand x 5 min for pain reduction/muscle relaxation in prep for ROM and strengthening exercises.  Therapeutic Exercises:  Pt seen this date for AAROM to right shoulder for shoulder flexion, ABD, ADD, elbow flexion/extension, forearm supination/pronation. PROM to digits of right hand for flexion, emphasis on index and middle finger flexion at all joints with prolonged stretch to PIP of each digit.   Pt performing reaching tasks to pick up [place] discs from tabletop and place in to container presented to patient in various planes of motion to encourage reach and shoulder/arm movements, multiple rounds completed.  Cues at times for prehension patterns and attempts to hold items, she is demonstrating some MP flexion, DIP flexion but PIP remains in extension.  Attempted to fit for oval 8 rings for swan neck deformity at PIP but rings were too small  and will need to order larger sizes.     Response to tx: Pt continues to progress with shoulder and arm movement patterns.  Continues to demonstrate extensor contractures in index and middle fingers at PIP joints but responds well to prolonged stretching.  Would likely benefit from oval 8 splint, will refit once larger sizes are received.  Pt still able to grossly pick up select items and demonstrates some DIP flexion but remains limited with grasping due to extensor patterns in index and middle. Continue to work towards goals in plan of care to improve right hand function.                         OT Education - 10/05/21 1935     Education Details ROM, grasping    Person(s) Educated Patient    Methods Explanation;Verbal cues;Demonstration    Comprehension Verbalized understanding;Returned demonstration;Tactile cues required;Verbal cues required;Need further instruction              OT Short Term Goals - 07/04/21 1355       OT SHORT TERM GOAL #1   Title I with initial HEP.    Time 6    Period Weeks    Status Achieved    Target Date 04/13/20      OT SHORT TERM GOAL #4   Title Pt will demonstrate 60* A/ROM shoulder flexion for RUE with pain less than or equal to 4/10.    Baseline Pt. presents with limited isolated  right shoulder flexion with pain continues to be 2. 08/08/20: 40* AROM with 3/10 pain; 03/07/21: Pt demos 75* active shoulder flexion with 2/10 pain or less. 40th visit: Pt. continues to present with limited shoulder ROM, and pain has decreased; 07/04/21 R shoulder active flexion 85, 3/10 pain.    Time 4    Period Weeks    Status On-going    Target Date 08/21/21      OT SHORT TERM GOAL #5   Title Pt will demonstrate improved LUE fine motor coordination for ADLs as evidenced by decreasing 9 hole peg test score to 25 secs or less.    Baseline 10/31/2020: Left FMC: 32 sec. Left FMC 41 sec visa the 9 hole peg test. 08/08/20: 32 seconds via 9 hole peg 40th  visit: Left hand: 35 sec.; 07/04/21: L hand 28 sec    Time 12    Period Weeks    Status On-going    Target Date 08/21/21      OT SHORT TERM GOAL #7   Title Pt will demonstrate at least 25% finger flexion/ extension in RUE in prep for  functional use.    Baseline Limited PROM, and active digit flexion, and extension. Improved AROM for right 2nd, and 3rd digits. significant limitations with the 4th, and 5th digits. 08/08/20: AROM digit MCP 2nd (80*), 3rd (85*), 4th (80*), 5th (90*); 2/21/2: Since Botox injection in Dec, R hand has had limited active movement; see note for details    Time 4    Period Weeks    Status On-going    Target Date 09/05/20               OT Long Term Goals - 09/30/21 1608       OT LONG TERM GOAL #1   Title Pt will perform all basic ADLs with supervision    Baseline 01/24/21 assist with bra 12/13/2020 unchanged 6/202/2022: Pt. is able to donn stretchy shirts, donn slide on shoes, donning pants. Pt. has difficylty with managing a bra,shirt buttons, and pant zipper/button.Pt. requires minA from her husband following multiple recent surgeries. Pt. requires minA from her husband. 08/08/20: Pt continues to require MIN A for grooming and dressing tasks; 03/07/21: pt performs UB/LB bathing and dressing with set up (assist with clothing fasteners); min A for grooming d/t unable to manage hair with 2 hands 40th visit: Pt. conitnues to require minA  for self-grooming, Pt. continues to perform UB/LB bathing/dressing, assist with fasteners. 09/28/2021: assist for fastening bra and to put hair in ponytail    Time 12    Period Weeks    Status On-going    Target Date 11/09/21      OT LONG TERM GOAL #2   Title Pt will demonstrate 60* RUE shoulder flexion in prep for functional reach with pain less than or equal to 3/10.    Baseline 12/13/20:  shoulder flexion right 64 degrees 10/31/20: Pt. continues to present with limited AROM for isolated  right shoulder flexion with pain. 08/08/20: R  AROM Shoulder flexion - 45* c 3/10 pain.. 09/28/21: 98 degrees shoulder flexion    Time 12    Period Weeks    Status Achieved    Target Date 11/09/21      OT LONG TERM GOAL #3   Title Pt will use RUE as a stabilizer/ gross A at least 30 % of the time for ADLs/ IADLs.    Baseline Pt. is using her right hand as a gross stabilizer 15% of the time. 08/08/20: requires MAX cues; 03/07/21 Pt using R hand as a gross stabilizer at least 25% of the time. 40th visit: Pt. has difficulty using her right hand  as a gross stabilizer. 09/28/2021: using about 25% of time as gross assist depending on task    Time 12    Period Weeks    Status On-going    Target Date 11/09/21      OT LONG TERM GOAL #4   Title Pt will increase LUE grip strength to 25 lbs or greater for increased ease with ADLs.    Baseline 12/13/20: 24# left, 1# right 10/31/2020: Pt. continues to present with limited left grip. Pt. has improved with left grip strength. Pt.continues to work towards 25#. 08/08/20: L grip 19#; 03/07/21: L grip 21 lbs (R 1 lb) 05/18/2021: Left grip strength: 30#09/28/2021:  grip limited in right measures 1#    Time 12    Period Weeks    Status On-going    Target Date 11/09/21      OT LONG TERM GOAL #5   Title 10/31/2020: Pt. is now perfroming laundry, loading the  dishwasher, assists some with cooking, light meal preparation, setting the table    Baseline 12/13/2020:  continues to assist as needed but uses left hand .  10/13/20: Pt. assists with filling the dishwasher, and performing laundry. Pt. continues to try to help when she can, however her husband mostly performs these tasks. 08/08/20: Husband performs; 03/07/21: Pt assists with laundry, loading dishwasher, light cooking/meal prep; mostly using LUE. 05/18/2021: pt. continues to assist with laundry skills, loading the dishwasher, light cooking using the left hand.  09/28/2021: using right hand at times as a stabilizer but still requires assist for tasks and using left in most  tasks.    Time 12    Period Weeks    Status On-going    Target Date 11/09/21      OT LONG TERM GOAL #6   Title Pt will demonstrate ability to grasp/ release a cup 2/3 trials with RUE.    Baseline 12/13/20:  Continues to work on consistency with grasp and release 10/31/2020: Pt. continues to initiate grasp, and active release of objects, however is unable to grasp/release a cup. 08/08/20: Able to grasp, unable to release x1 trial; 03/05/21: Able to grasp/release cup with min A to keep cup from tipping over.05/18/2021: Pt. is unable to grasp items secondary to changes in the right hand following Botox injections. Pt. has 2nd,a nd 3rd digit PIP extensor tightness. 09/28/2021: pt with extensor tone in index and MF, difficulty with grasping patterns.    Time 12    Period Weeks    Status On-going    Target Date 11/09/21      OT LONG TERM GOAL #7   Title Pt will demonstrate the ability to place clothing on hangers with use of bilateral UE and modified technique.    Baseline unable; 03/07/21: min-mod A, 09/28/2021: min assist    Time 12    Period Weeks    Status On-going    Target Date 11/09/21      OT LONG TERM GOAL #8   Title I with updated HEP.    Baseline 12/13/2020: Continual changes to HEP 09/12/2020: Pt. continues to require assist for her HEP. Pt. requires assist, and cuing for HEP. 08/08/20: reports completing with husband assist for technique; 03/07/21: min vc. 05/18/2021: Pt. requires assist with HEPs, Husband assists with HEP. 09/28/2021: continue to update HEP    Time 12    Period Weeks    Status Partially Met    Target Date 11/09/21      OT LONG TERM GOAL  #9   TITLE Pt will demonstrate the ability to don and doff robe after shower with modified independence    Baseline unable; 03/07/21: pt reports she avoids her robe (too heavy); uses towel instead. 09/28/2021: new robe which is lighter weight    Time 12    Period Weeks    Status On-going    Target Date 11/09/21      OT LONG TERM GOAL   #10   TITLE Pt will demonstrate the ability to perform zippers with modified independence and use of adaptive equipment as needed.    Baseline difficulty; 03/07/21: cues to modify zippers with string loops (has not yet tried) 05/18/2021: TBD, 09/28/2021: continued difficulty and requires assist    Time 12    Period Weeks    Status On-going    Target Date 11/09/21                   Plan - 10/05/21 1935  Clinical Impression Statement Pt continues to progress with shoulder and arm movement patterns.  Continues to demonstrate extensor contractures in index and middle fingers at PIP joints but responds well to prolonged stretching.  Would likely benefit from oval 8 splint, will refit once larger sizes are received.  Pt still able to grossly pick up select items and demonstrates some DIP flexion but remains limited with grasping due to extensor patterns in index and middle. Continue to work towards goals in plan of care to improve right hand function.    OT Occupational Profile and History Detailed Assessment- Review of Records and additional review of physical, cognitive, psychosocial history related to current functional performance    Occupational performance deficits (Please refer to evaluation for details): ADL's;IADL's;Rest and Sleep;Work;Play;Leisure    Body Structure / Function / Physical Skills ADL;Balance;Coordination;Decreased knowledge of precautions;Decreased knowledge of use of DME;Dexterity;Edema;Mobility;Tone;Strength;IADL;Sensation;GMC;Gait;ROM;FMC;Flexibility;Pain;Vision;UE functional use;Endurance    Cognitive Skills Attention;Perception;Problem Solve;Safety Awareness;Thought    Rehab Potential Good    Clinical Decision Making Several treatment options, min-mod task modification necessary    Comorbidities Affecting Occupational Performance: None    Modification or Assistance to Complete Evaluation  Min-Moderate modification of tasks or assist with assess necessary to  complete eval    OT Frequency 2x / week    OT Treatment/Interventions Self-care/ADL training;Ultrasound;Energy conservation;Visual/perceptual remediation/compensation;Patient/family education;DME and/or AE instruction;Aquatic Therapy;Paraffin;Gait Training;Passive range of motion;Balance training;Fluidtherapy;Electrical Stimulation;Functional Mobility Training;Splinting;Moist Heat;Therapeutic exercise;Manual Therapy;Cognitive remediation/compensation;Manual lymph drainage;Neuromuscular education;Coping strategies training    Consulted and Agree with Plan of Care Patient    Family Member Consulted Spose present during the session.             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, unspecified laterality (HCC)  Pain in right hand    Problem List Patient Active Problem List   Diagnosis Date Noted   Dyspareunia in female 04/05/2020   History of abnormal cervical Pap smear 03/10/2020   GIB (gastrointestinal bleeding) 02/28/2020   Elevated BUN    Prediabetes    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   Cerebral vasospasm    Sinus tachycardia    Dysphagia, post-stroke    Thrombocytopenia (HCC)    Acute blood loss anemia    Brain aneurysm    ICH (intracerebral hemorrhage) ([place]) 01/05/2020   History of adenomatous polyp of colon 02/07/2016   Anatomical narrow angle, bilateral 12/14/2014    Fissure in ano 11/11/2014   Hemorrhoids 11/11/2014   Constipation 11/19/2013   GERD (gastroesophageal reflux disease) 03/24/2013   Irritable bowel syndrome with diarrhea 03/24/2013   Herpes zoster 05/14/2005   Abnormal Pap smear of cervix 05/15/1983   History of cervical dysplasia 05/15/1983   Eliot Popper T Tomasita Morrow, OTR/L, CLT  Lakeith Careaga, OT 10/05/2021, 8:36 PM  [place] MAIN [facility] SERVICES [address] [place], [place], [address] Phone: [phone]   Fax:  [phone]  Name: CHYNNA BUERKLE MRN: [id number] Date of Birth: 02-04-1956

## 2021-10-10 ENCOUNTER — Ambulatory Visit: Payer: Medicare PPO | Admitting: Occupational Therapy

## 2021-10-10 DIAGNOSIS — I69159 Hemiplegia and hemiparesis following nontraumatic intracerebral hemorrhage affecting unspecified side: Secondary | ICD-10-CM

## 2021-10-10 DIAGNOSIS — M6281 Muscle weakness (generalized): Secondary | ICD-10-CM

## 2021-10-10 DIAGNOSIS — R278 Other lack of coordination: Secondary | ICD-10-CM

## 2021-10-10 DIAGNOSIS — M79641 Pain in right hand: Secondary | ICD-10-CM

## 2021-10-11 NOTE — Therapy (Signed)
[place] MAIN [facility] SERVICES [address] [place], [place], [address] Phone: [phone]   Fax:  [phone]  Occupational Therapy Treatment  Patient Details  Name: Erika Stanton MRN: [id number] Date of Birth: 21-Feb-1956 No data recorded  Encounter Date: 10/10/2021   OT End of Session - 10/11/21 2014     Visit Number 143    Number of Visits 148    Date for OT Re-Evaluation 11/09/21    Authorization Type Progress report period starting 07/06/21    OT Start Time 1100    OT Stop Time 1147    OT Time Calculation (min) 47 min    Activity Tolerance Patient tolerated treatment well    Behavior During Therapy [facility] for tasks assessed/performed             Past Medical History:  Diagnosis Date   Aneurysm ([place])    Melena     Past Surgical History:  Procedure Laterality Date   CHOLECYSTECTOMY     COLONOSCOPY  11/2017   at [facility]. no recurrent polyps.  suggest repeat surveillance study 11/2022.     COLONOSCOPY W/ POLYPECTOMY  06/2014   Dr Roxy Manns at [facility].  3 adenomatoous polyps, anal fissure.     ESOPHAGOGASTRODUODENOSCOPY (EGD) WITH PROPOFOL N/A 01/27/2020   Procedure: ESOPHAGOGASTRODUODENOSCOPY (EGD) WITH PROPOFOL;  Surgeon: Mauri Pole, MD;  Location: [facility];  Service: Endoscopy;  Laterality: N/A;   IR 3D INDEPENDENT WKST  01/06/2020   IR ANGIO INTRA EXTRACRAN SEL INTERNAL CAROTID BILAT MOD SED  01/06/2020   IR ANGIO VERTEBRAL SEL VERTEBRAL UNI L MOD SED  01/06/2020   IR ANGIOGRAM FOLLOW UP STUDY  01/06/2020   IR ANGIOGRAM FOLLOW UP STUDY  01/06/2020   IR ANGIOGRAM FOLLOW UP STUDY  01/06/2020   IR ANGIOGRAM FOLLOW UP STUDY  01/06/2020   IR ANGIOGRAM FOLLOW UP STUDY  01/06/2020   IR ANGIOGRAM FOLLOW UP STUDY  01/06/2020   IR ANGIOGRAM FOLLOW UP STUDY  01/06/2020   IR ANGIOGRAM FOLLOW UP STUDY  01/06/2020   IR ANGIOGRAM FOLLOW UP STUDY  01/06/2020   IR ANGIOGRAM FOLLOW UP STUDY  01/06/2020   IR NEURO EACH ADD'L AFTER BASIC UNI RIGHT  (MS)  01/06/2020   IR TRANSCATH/EMBOLIZ  01/06/2020   RADIOLOGY WITH ANESTHESIA N/A 01/06/2020   Procedure: IR WITH ANESTHESIA FOR ANEURYSM;  Surgeon: Consuella Lose, MD;  Location: [facility];  Service: Radiology;  Laterality: N/A;   TUBAL LIGATION      There were no vitals filed for this visit.   Subjective Assessment - 10/11/21 2012     Subjective  Pt reports she is leaving tomorrow for her vacation with her husband and will be gone for a couple weeks.    Pertinent History ICH 01/06/20, Aneurysm. PMH: OA bilateral knees and hands    Repetition Decreases Symptoms    Patient Stated Goals Pt would like to be as independent as possible.    Currently in Pain? Yes    Pain Score 2     Pain Location Hand    Pain Orientation Right    Pain Descriptors / Indicators Tightness;Aching    Pain Type Chronic pain    Pain Onset More than a month ago                                  OT Education - 10/11/21 2013     Education Details  reaching, ROM exercises for right hand, grasp and release    Person(s) Educated Patient    Methods Explanation;Verbal cues;Demonstration    Comprehension Verbalized understanding;Returned demonstration;Tactile cues required;Verbal cues required;Need further instruction              OT Short Term Goals - 07/04/21 1355       OT SHORT TERM GOAL #1   Title I with initial HEP.    Time 6    Period Weeks    Status Achieved    Target Date 04/13/20      OT SHORT TERM GOAL #4   Title Pt will demonstrate 60* A/ROM shoulder flexion for RUE with pain less than or equal to 4/10.    Baseline Pt. presents with limited isolated  right shoulder flexion with pain continues to be 2. 08/08/20: 40* AROM with 3/10 pain; 03/07/21: Pt demos 75* active shoulder flexion with 2/10 pain or less. 40th visit: Pt. continues to present with limited shoulder ROM, and pain has decreased; 07/04/21 R shoulder active flexion 85, 3/10 pain.    Time 4    Period Weeks     Status On-going    Target Date 08/21/21      OT SHORT TERM GOAL #5   Title Pt will demonstrate improved LUE fine motor coordination for ADLs as evidenced by decreasing 9 hole peg test score to 25 secs or less.    Baseline 10/31/2020: Left FMC: 32 sec. Left FMC 41 sec visa the 9 hole peg test. 08/08/20: 32 seconds via 9 hole peg 40th visit: Left hand: 35 sec.; 07/04/21: L hand 28 sec    Time 12    Period Weeks    Status On-going    Target Date 08/21/21      OT SHORT TERM GOAL #7   Title Pt will demonstrate at least 25% finger flexion/ extension in RUE in prep for functional use.    Baseline Limited PROM, and active digit flexion, and extension. Improved AROM for right 2nd, and 3rd digits. significant limitations with the 4th, and 5th digits. 08/08/20: AROM digit MCP 2nd (80*), 3rd (85*), 4th (80*), 5th (90*); 2/21/2: Since Botox injection in Dec, R hand has had limited active movement; see note for details    Time 4    Period Weeks    Status On-going    Target Date 09/05/20               OT Long Term Goals - 09/30/21 1608       OT LONG TERM GOAL #1   Title Pt will perform all basic ADLs with supervision    Baseline 01/24/21 assist with bra 12/13/2020 unchanged 6/202/2022: Pt. is able to donn stretchy shirts, donn slide on shoes, donning pants. Pt. has difficylty with managing a bra,shirt buttons, and pant zipper/button.Pt. requires minA from her husband following multiple recent surgeries. Pt. requires minA from her husband. 08/08/20: Pt continues to require MIN A for grooming and dressing tasks; 03/07/21: pt performs UB/LB bathing and dressing with set up (assist with clothing fasteners); min A for grooming d/t unable to manage hair with 2 hands 40th visit: Pt. conitnues to require minA  for self-grooming, Pt. continues to perform UB/LB bathing/dressing, assist with fasteners. 09/28/2021: assist for fastening bra and to put hair in ponytail    Time 12    Period Weeks    Status On-going     Target Date 11/09/21      OT LONG TERM GOAL #2  Title Pt will demonstrate 60* RUE shoulder flexion in prep for functional reach with pain less than or equal to 3/10.    Baseline 12/13/20:  shoulder flexion right 64 degrees 10/31/20: Pt. continues to present with limited AROM for isolated  right shoulder flexion with pain. 08/08/20: R AROM Shoulder flexion - 45* c 3/10 pain.. 09/28/21: 98 degrees shoulder flexion    Time 12    Period Weeks    Status Achieved    Target Date 11/09/21      OT LONG TERM GOAL #3   Title Pt will use RUE as a stabilizer/ gross A at least 30 % of the time for ADLs/ IADLs.    Baseline Pt. is using her right hand as a gross stabilizer 15% of the time. 08/08/20: requires MAX cues; 03/07/21 Pt using R hand as a gross stabilizer at least 25% of the time. 40th visit: Pt. has difficulty using her right hand  as a gross stabilizer. 09/28/2021: using about 25% of time as gross assist depending on task    Time 12    Period Weeks    Status On-going    Target Date 11/09/21      OT LONG TERM GOAL #4   Title Pt will increase LUE grip strength to 25 lbs or greater for increased ease with ADLs.    Baseline 12/13/20: 24# left, 1# right 10/31/2020: Pt. continues to present with limited left grip. Pt. has improved with left grip strength. Pt.continues to work towards 25#. 08/08/20: L grip 19#; 03/07/21: L grip 21 lbs (R 1 lb) 05/18/2021: Left grip strength: 30#09/28/2021:  grip limited in right measures 1#    Time 12    Period Weeks    Status On-going    Target Date 11/09/21      OT LONG TERM GOAL #5   Title 10/31/2020: Pt. is now perfroming laundry, loading the dishwasher, assists some with cooking, light meal preparation, setting the table    Baseline 12/13/2020:  continues to assist as needed but uses left hand .  10/13/20: Pt. assists with filling the dishwasher, and performing laundry. Pt. continues to try to help when she can, however her husband mostly performs these tasks. 08/08/20: Husband  performs; 03/07/21: Pt assists with laundry, loading dishwasher, light cooking/meal prep; mostly using LUE. 05/18/2021: pt. continues to assist with laundry skills, loading the dishwasher, light cooking using the left hand.  09/28/2021: using right hand at times as a stabilizer but still requires assist for tasks and using left in most tasks.    Time 12    Period Weeks    Status On-going    Target Date 11/09/21      OT LONG TERM GOAL #6   Title Pt will demonstrate ability to grasp/ release a cup 2/3 trials with RUE.    Baseline 12/13/20:  Continues to work on consistency with grasp and release 10/31/2020: Pt. continues to initiate grasp, and active release of objects, however is unable to grasp/release a cup. 08/08/20: Able to grasp, unable to release x1 trial; 03/05/21: Able to grasp/release cup with min A to keep cup from tipping over.05/18/2021: Pt. is unable to grasp items secondary to changes in the right hand following Botox injections. Pt. has 2nd,a nd 3rd digit PIP extensor tightness. 09/28/2021: pt with extensor tone in index and MF, difficulty with grasping patterns.    Time 12    Period Weeks    Status On-going    Target Date 11/09/21  OT LONG TERM GOAL #7   Title Pt will demonstrate the ability to place clothing on hangers with use of bilateral UE and modified technique.    Baseline unable; 03/07/21: min-mod A, 09/28/2021: min assist    Time 12    Period Weeks    Status On-going    Target Date 11/09/21      OT LONG TERM GOAL #8   Title I with updated HEP.    Baseline 12/13/2020: Continual changes to HEP 09/12/2020: Pt. continues to require assist for her HEP. Pt. requires assist, and cuing for HEP. 08/08/20: reports completing with husband assist for technique; 03/07/21: min vc. 05/18/2021: Pt. requires assist with HEPs, Husband assists with HEP. 09/28/2021: continue to update HEP    Time 12    Period Weeks    Status Partially Met    Target Date 11/09/21      OT LONG TERM GOAL  #9    TITLE Pt will demonstrate the ability to don and doff robe after shower with modified independence    Baseline unable; 03/07/21: pt reports she avoids her robe (too heavy); uses towel instead. 09/28/2021: new robe which is lighter weight    Time 12    Period Weeks    Status On-going    Target Date 11/09/21      OT LONG TERM GOAL  #10   TITLE Pt will demonstrate the ability to perform zippers with modified independence and use of adaptive equipment as needed.    Baseline difficulty; 03/07/21: cues to modify zippers with string loops (has not yet tried) 05/18/2021: TBD, 09/28/2021: continued difficulty and requires assist    Time 12    Period Weeks    Status On-going    Target Date 11/09/21                   Plan - 10/11/21 2014     Clinical Impression Statement P    OT Occupational Profile and History Detailed Assessment- Review of Records and additional review of physical, cognitive, psychosocial history related to current functional performance    Occupational performance deficits (Please refer to evaluation for details): ADL's;IADL's;Rest and Sleep;Work;Play;Leisure    Body Structure / Function / Physical Skills ADL;Balance;Coordination;Decreased knowledge of precautions;Decreased knowledge of use of DME;Dexterity;Edema;Mobility;Tone;Strength;IADL;Sensation;GMC;Gait;ROM;FMC;Flexibility;Pain;Vision;UE functional use;Endurance    Cognitive Skills Attention;Perception;Problem Solve;Safety Awareness;Thought    Rehab Potential Good    Clinical Decision Making Several treatment options, min-mod task modification necessary    Comorbidities Affecting Occupational Performance: None    Modification or Assistance to Complete Evaluation  Min-Moderate modification of tasks or assist with assess necessary to complete eval    OT Frequency 2x / week    OT Treatment/Interventions Self-care/ADL training;Ultrasound;Energy conservation;Visual/perceptual remediation/compensation;Patient/family  education;DME and/or AE instruction;Aquatic Therapy;Paraffin;Gait Training;Passive range of motion;Balance training;Fluidtherapy;Electrical Stimulation;Functional Mobility Training;Splinting;Moist Heat;Therapeutic exercise;Manual Therapy;Cognitive remediation/compensation;Manual lymph drainage;Neuromuscular education;Coping strategies training    Consulted and Agree with Plan of Care Patient    Family Member Consulted Spose present during the session.             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, unspecified laterality (HCC)  Pain in right hand    Problem List Patient Active Problem List   Diagnosis Date Noted   Dyspareunia in female 04/05/2020   History of abnormal cervical Pap smear 03/10/2020   GIB (gastrointestinal bleeding) 02/28/2020   Elevated BUN    Prediabetes    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   Cerebral vasospasm    Sinus tachycardia    Dysphagia, post-stroke    Thrombocytopenia (HCC)    Acute blood loss anemia    Brain aneurysm    ICH (intracerebral hemorrhage) ([place]) 01/05/2020   History of adenomatous polyp of colon 02/07/2016   Anatomical narrow angle, bilateral 12/14/2014   Fissure in ano 11/11/2014   Hemorrhoids 11/11/2014   Constipation 11/19/2013   GERD (gastroesophageal reflux disease) 03/24/2013   Irritable bowel syndrome with diarrhea 03/24/2013    Herpes zoster 05/14/2005   Abnormal Pap smear of cervix 05/15/1983   History of cervical dysplasia 05/15/1983   Teagan Ozawa T Tomasita Morrow, OTR/L, CLT  Khushboo Chuck, OT 10/11/2021, 8:15 PM  [place] MAIN [facility] SERVICES [address] [place], [place], [address] Phone: [phone]   Fax:  [phone]  Name: Erika Stanton MRN: [id number] Date of Birth: 02/28/1956

## 2021-10-12 ENCOUNTER — Encounter: Payer: Medicare PPO | Admitting: Occupational Therapy

## 2021-10-19 ENCOUNTER — Encounter: Payer: Medicare PPO | Admitting: Occupational Therapy

## 2021-10-31 ENCOUNTER — Ambulatory Visit: Payer: Medicare PPO | Attending: Physical Medicine & Rehabilitation | Admitting: Occupational Therapy

## 2021-10-31 DIAGNOSIS — I69159 Hemiplegia and hemiparesis following nontraumatic intracerebral hemorrhage affecting unspecified side: Secondary | ICD-10-CM | POA: Insufficient documentation

## 2021-10-31 DIAGNOSIS — R278 Other lack of coordination: Secondary | ICD-10-CM | POA: Diagnosis present

## 2021-10-31 DIAGNOSIS — M6281 Muscle weakness (generalized): Secondary | ICD-10-CM | POA: Insufficient documentation

## 2021-10-31 NOTE — Therapy (Incomplete)
OUTPATIENT OCCUPATIONAL THERAPY TREATMENT NOTE   Patient Name: Erika Stanton MRN: [id number] DOB:06-24-1955, 66 y.o., female Today's Date: 10/31/2021  PCP: Su Ley, MD REFERRING PROVIDER: Alger Simons, MD   OT End of Session - 10/31/21 1542     Visit Number 144    Number of Visits 148    Date for OT Re-Evaluation 11/09/21    Authorization Type Progress report period starting 07/06/21    OT Start Time 51    OT Stop Time 1145    OT Time Calculation (min) 45 min    Activity Tolerance Patient tolerated treatment well    Behavior During Therapy [facility] for tasks assessed/performed             Past Medical History:  Diagnosis Date   Aneurysm ([place])    Melena    Past Surgical History:  Procedure Laterality Date   CHOLECYSTECTOMY     COLONOSCOPY  11/2017   at [facility]. no recurrent polyps.  suggest repeat surveillance study 11/2022.     COLONOSCOPY W/ POLYPECTOMY  06/2014   Dr Roxy Manns at [facility].  3 adenomatoous polyps, anal fissure.     ESOPHAGOGASTRODUODENOSCOPY (EGD) WITH PROPOFOL N/A 01/27/2020   Procedure: ESOPHAGOGASTRODUODENOSCOPY (EGD) WITH PROPOFOL;  Surgeon: Mauri Pole, MD;  Location: [facility];  Service: Endoscopy;  Laterality: N/A;   IR 3D INDEPENDENT WKST  01/06/2020   IR ANGIO INTRA EXTRACRAN SEL INTERNAL CAROTID BILAT MOD SED  01/06/2020   IR ANGIO VERTEBRAL SEL VERTEBRAL UNI L MOD SED  01/06/2020   IR ANGIOGRAM FOLLOW UP STUDY  01/06/2020   IR ANGIOGRAM FOLLOW UP STUDY  01/06/2020   IR ANGIOGRAM FOLLOW UP STUDY  01/06/2020   IR ANGIOGRAM FOLLOW UP STUDY  01/06/2020   IR ANGIOGRAM FOLLOW UP STUDY  01/06/2020   IR ANGIOGRAM FOLLOW UP STUDY  01/06/2020   IR ANGIOGRAM FOLLOW UP STUDY  01/06/2020   IR ANGIOGRAM FOLLOW UP STUDY  01/06/2020   IR ANGIOGRAM FOLLOW UP STUDY  01/06/2020   IR ANGIOGRAM FOLLOW UP STUDY  01/06/2020   IR NEURO EACH ADD'L AFTER BASIC UNI RIGHT (MS)  01/06/2020   IR TRANSCATH/EMBOLIZ  01/06/2020   RADIOLOGY WITH ANESTHESIA N/A 01/06/2020    Procedure: IR WITH ANESTHESIA FOR ANEURYSM;  Surgeon: Consuella Lose, MD;  Location: [place];  Service: Radiology;  Laterality: N/A;   TUBAL LIGATION     Patient Active Problem List   Diagnosis Date Noted   Dyspareunia in female 04/05/2020   History of abnormal cervical Pap smear 03/10/2020   GIB (gastrointestinal bleeding) 02/28/2020   Elevated BUN    Prediabetes    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   Cerebral vasospasm    Sinus tachycardia    Dysphagia, post-stroke    Thrombocytopenia ([facility])    Acute blood loss anemia    Brain aneurysm    ICH (intracerebral hemorrhage) ([place]) 01/05/2020   History of adenomatous polyp of colon 02/07/2016   Anatomical narrow angle, bilateral 12/14/2014   Fissure in ano 11/11/2014   Hemorrhoids 11/11/2014   Constipation 11/19/2013   GERD (gastroesophageal reflux disease) 03/24/2013   Irritable bowel syndrome with diarrhea 03/24/2013   Herpes zoster 05/14/2005   Abnormal Pap smear of cervix 05/15/1983   History of cervical dysplasia 05/15/1983      REFERRING DIAG: Aneurysm  THERAPY DIAG:   Weakness. Lack of Coordination  Rationale for Evaluation and Treatment Rehabilitation  PERTINENT HISTORY:   ICH 01/06/20, Aneurysm. PMH: OA bilateral knees and hands  SUBJECTIVE:   Pt. Reports having had a nice vacation travelling to the [place] last week.  PAIN:  Are you having pain? No     OBJECTIVE:   TODAY'S TREATMENT:   Therapeutic Exercise:   Pt. tolerated PROM to the right 2nd, and 3rd digits to increase digit MP, PIP, and DIP flexion in preparation for formulating a fist. Pt. Tolerated PROM, and AAROM for 5th, and 5th digit extension.   Neuromuscular re-education:   Pt. worked on right hand [facility] skills using a 3pt. grasp pattern for grasping  [place] style discs. Emphasis was placed on thumb abduction, and finger extension with releasing the [place] discs into the container.   Pt. tolerated moist heat, and manual  therapy well prior to ROM. Pt. tolerated ROM with emphasis placed on PROM for right 2nd, and 3rd digit MP, PIP, and DIP flexion 2/2 digit extensor tightness, and 4th & 5th digit extension 2/2 flexor tightness. Pt. Presented with improved 3pt. Grasp, and release when grasping for [place] style pegs. Pt. continues to work on improving ROM with right 2nd, and 3rd digit MP, PIP, and DIP flexion, and overall RUE functioning to improve engagement in daily ADL, and IADL tasks. Pt. continues to work on improving RUE functioning in order to improve RUE functional reaching, and functional grasp patterns.    PATIENT EDUCATION:  Education details: ROM to the digits of the right hand, grasp patterns Person educated: Patient Education method: Customer service manager Education comprehension: verbalized understanding, returned demonstration, and needs further education   HOME EXERCISE PROGRAM   Pt. To continues to HEP for ROM           OT Short Term Goals - 07/04/21 1355       OT SHORT TERM GOAL #1   Title I with initial HEP.    Time 6    Period Weeks    Status Achieved    Target Date 04/13/20      OT SHORT TERM GOAL #4   Title Pt will demonstrate 60* A/ROM shoulder flexion for RUE with pain less than or equal to 4/10.    Baseline Pt. presents with limited isolated  right shoulder flexion with pain continues to be 2. 08/08/20: 40* AROM with 3/10 pain; 03/07/21: Pt demos 75* active shoulder flexion with 2/10 pain or less. 40th visit: Pt. continues to present with limited shoulder ROM, and pain has decreased; 07/04/21 R shoulder active flexion 85, 3/10 pain.    Time 4    Period Weeks    Status On-going    Target Date 08/21/21      OT SHORT TERM GOAL #5   Title Pt will demonstrate improved LUE fine motor coordination for ADLs as evidenced by decreasing 9 hole peg test score to 25 secs or less.    Baseline 10/31/2020: Left FMC: 32 sec. Left FMC 41 sec visa the 9 hole peg test. 08/08/20: 32  seconds via 9 hole peg 40th visit: Left hand: 35 sec.; 07/04/21: L hand 28 sec    Time 12    Period Weeks    Status On-going    Target Date 08/21/21      OT SHORT TERM GOAL #7   Title Pt will demonstrate at least 25% finger flexion/ extension in RUE in prep for functional use.    Baseline Limited PROM, and active digit flexion, and extension. Improved AROM for right 2nd, and 3rd digits. significant limitations with the 4th, and 5th digits. 08/08/20: AROM digit MCP 2nd (80*),  3rd (85*), 4th (80*), 5th (90*); 2/21/2: Since Botox injection in Dec, R hand has had limited active movement; see note for details    Time 4    Period Weeks    Status On-going    Target Date 09/05/20              OT Long Term Goals - 09/30/21 1608       OT LONG TERM GOAL #1   Title Pt will perform all basic ADLs with supervision    Baseline 01/24/21 assist with bra 12/13/2020 unchanged 6/202/2022: Pt. is able to donn stretchy shirts, donn slide on shoes, donning pants. Pt. has difficylty with managing a bra,shirt buttons, and pant zipper/button.Pt. requires minA from her husband following multiple recent surgeries. Pt. requires minA from her husband. 08/08/20: Pt continues to require MIN A for grooming and dressing tasks; 03/07/21: pt performs UB/LB bathing and dressing with set up (assist with clothing fasteners); min A for grooming d/t unable to manage hair with 2 hands 40th visit: Pt. conitnues to require minA  for self-grooming, Pt. continues to perform UB/LB bathing/dressing, assist with fasteners. 09/28/2021: assist for fastening bra and to put hair in ponytail    Time 12    Period Weeks    Status On-going    Target Date 11/09/21      OT LONG TERM GOAL #2   Title Pt will demonstrate 60* RUE shoulder flexion in prep for functional reach with pain less than or equal to 3/10.    Baseline 12/13/20:  shoulder flexion right 64 degrees 10/31/20: Pt. continues to present with limited AROM for isolated  right shoulder  flexion with pain. 08/08/20: R AROM Shoulder flexion - 45* c 3/10 pain.. 09/28/21: 98 degrees shoulder flexion    Time 12    Period Weeks    Status Achieved    Target Date 11/09/21      OT LONG TERM GOAL #3   Title Pt will use RUE as a stabilizer/ gross A at least 30 % of the time for ADLs/ IADLs.    Baseline Pt. is using her right hand as a gross stabilizer 15% of the time. 08/08/20: requires MAX cues; 03/07/21 Pt using R hand as a gross stabilizer at least 25% of the time. 40th visit: Pt. has difficulty using her right hand  as a gross stabilizer. 09/28/2021: using about 25% of time as gross assist depending on task    Time 12    Period Weeks    Status On-going    Target Date 11/09/21      OT LONG TERM GOAL #4   Title Pt will increase LUE grip strength to 25 lbs or greater for increased ease with ADLs.    Baseline 12/13/20: 24# left, 1# right 10/31/2020: Pt. continues to present with limited left grip. Pt. has improved with left grip strength. Pt.continues to work towards 25#. 08/08/20: L grip 19#; 03/07/21: L grip 21 lbs (R 1 lb) 05/18/2021: Left grip strength: 30#09/28/2021:  grip limited in right measures 1#    Time 12    Period Weeks    Status On-going    Target Date 11/09/21      OT LONG TERM GOAL #5   Title 10/31/2020: Pt. is now perfroming laundry, loading the dishwasher, assists some with cooking, light meal preparation, setting the table    Baseline 12/13/2020:  continues to assist as needed but uses left hand .  10/13/20: Pt. assists with filling the dishwasher, and performing  laundry. Pt. continues to try to help when she can, however her husband mostly performs these tasks. 08/08/20: Husband performs; 03/07/21: Pt assists with laundry, loading dishwasher, light cooking/meal prep; mostly using LUE. 05/18/2021: pt. continues to assist with laundry skills, loading the dishwasher, light cooking using the left hand.  09/28/2021: using right hand at times as a stabilizer but still requires assist for  tasks and using left in most tasks.    Time 12    Period Weeks    Status On-going    Target Date 11/09/21      OT LONG TERM GOAL #6   Title Pt will demonstrate ability to grasp/ release a cup 2/3 trials with RUE.    Baseline 12/13/20:  Continues to work on consistency with grasp and release 10/31/2020: Pt. continues to initiate grasp, and active release of objects, however is unable to grasp/release a cup. 08/08/20: Able to grasp, unable to release x1 trial; 03/05/21: Able to grasp/release cup with min A to keep cup from tipping over.05/18/2021: Pt. is unable to grasp items secondary to changes in the right hand following Botox injections. Pt. has 2nd,a nd 3rd digit PIP extensor tightness. 09/28/2021: pt with extensor tone in index and MF, difficulty with grasping patterns.    Time 12    Period Weeks    Status On-going    Target Date 11/09/21      OT LONG TERM GOAL #7   Title Pt will demonstrate the ability to place clothing on hangers with use of bilateral UE and modified technique.    Baseline unable; 03/07/21: min-mod A, 09/28/2021: min assist    Time 12    Period Weeks    Status On-going    Target Date 11/09/21      OT LONG TERM GOAL #8   Title I with updated HEP.    Baseline 12/13/2020: Continual changes to HEP 09/12/2020: Pt. continues to require assist for her HEP. Pt. requires assist, and cuing for HEP. 08/08/20: reports completing with husband assist for technique; 03/07/21: min vc. 05/18/2021: Pt. requires assist with HEPs, Husband assists with HEP. 09/28/2021: continue to update HEP    Time 12    Period Weeks    Status Partially Met    Target Date 11/09/21      OT LONG TERM GOAL  #9   TITLE Pt will demonstrate the ability to don and doff robe after shower with modified independence    Baseline unable; 03/07/21: pt reports she avoids her robe (too heavy); uses towel instead. 09/28/2021: new robe which is lighter weight    Time 12    Period Weeks    Status On-going    Target Date  11/09/21      OT LONG TERM GOAL  #10   TITLE Pt will demonstrate the ability to perform zippers with modified independence and use of adaptive equipment as needed.    Baseline difficulty; 03/07/21: cues to modify zippers with string loops (has not yet tried) 05/18/2021: TBD, 09/28/2021: continued difficulty and requires assist    Time 12    Period Weeks    Status On-going    Target Date 11/09/21              Plan - 10/31/21 1557     Clinical Impression Statement Pt. tolerated moist heat, and manual therapy well prior to ROM. Pt. tolerated ROM with emphasis placed on PROM for right 2nd, and 3rd digit MP, PIP, and DIP flexion 2/2 digit extensor tightness, and  4th & 5th digit extension 2/2 flexor tightness. Pt. Presented with improved 3pt. Grasp, and release when grasping for [place] style pegs. Pt. continues to work on improving ROM with right 2nd, and 3rd digit MP, PIP, and DIP flexion, and overall RUE functioning to improve engagement in daily ADL, and IADL tasks. Pt. continues to work on improving RUE functioning in order to improve RUE functional reaching, and functional grasp patterns.    OT Occupational Profile and History Detailed Assessment- Review of Records and additional review of physical, cognitive, psychosocial history related to current functional performance    Occupational performance deficits (Please refer to evaluation for details): ADL's;IADL's;Rest and Sleep;Work;Play;Leisure    Body Structure / Function / Physical Skills ADL;Balance;Coordination;Decreased knowledge of precautions;Decreased knowledge of use of DME;Dexterity;Edema;Mobility;Tone;Strength;IADL;Sensation;GMC;Gait;ROM;FMC;Flexibility;Pain;Vision;UE functional use;Endurance    Cognitive Skills Attention;Perception;Problem Solve;Safety Awareness;Thought    Rehab Potential Good    Clinical Decision Making Several treatment options, min-mod task modification necessary    Comorbidities Affecting Occupational  Performance: None    Modification or Assistance to Complete Evaluation  Min-Moderate modification of tasks or assist with assess necessary to complete eval    OT Frequency 2x / week    OT Treatment/Interventions Self-care/ADL training;Ultrasound;Energy conservation;Visual/perceptual remediation/compensation;Patient/family education;DME and/or AE instruction;Aquatic Therapy;Paraffin;Gait Training;Passive range of motion;Balance training;Fluidtherapy;Electrical Stimulation;Functional Mobility Training;Splinting;Moist Heat;Therapeutic exercise;Manual Therapy;Cognitive remediation/compensation;Manual lymph drainage;Neuromuscular education;Coping strategies training    Consulted and Agree with Plan of Care Patient    Family Member Consulted Spose present during the session.            Harrel Carina, MS, OTR/L   Harrel Carina, OT 10/31/2021, 3:57 PM

## 2021-11-02 ENCOUNTER — Ambulatory Visit: Payer: Medicare PPO

## 2021-11-02 DIAGNOSIS — R278 Other lack of coordination: Secondary | ICD-10-CM

## 2021-11-02 DIAGNOSIS — M6281 Muscle weakness (generalized): Secondary | ICD-10-CM | POA: Diagnosis not present

## 2021-11-02 DIAGNOSIS — I69159 Hemiplegia and hemiparesis following nontraumatic intracerebral hemorrhage affecting unspecified side: Secondary | ICD-10-CM

## 2021-11-05 IMAGING — CT CT ANGIO NECK
2 of 10 series · 7 of 36 positions shown · IV contrast (APPLIED)
Comparison: Prior study from 01/07/2020 and 01/05/2020

CLINICAL DATA: Initial evaluation for acute headache.

EXAM:
CT ANGIOGRAPHY HEAD AND NECK
TECHNIQUE: Multidetector CT imaging of the head and neck was performed using
the standard protocol during bolus administration of intravenous
contrast. Multiplanar CT image reconstructions and MIPs were
obtained to evaluate the vascular anatomy. Carotid stenosis
measurements (when applicable) are obtained utilizing NASCET
criteria, using the distal internal carotid diameter as the
denominator.
CONTRAST:  75mL OMNIPAQUE IOHEXOL 350 MG/ML SOLN

[Series 7: sagittal soft tissue · sagittal · 0.33mm/px · 1 of 56 slices shown]
[im 9/56  soft-tissue]
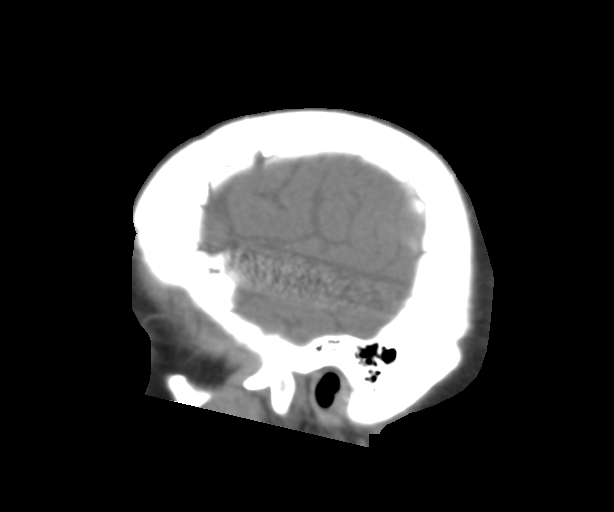

[Series 10: ax thin · axial · 0.39mm/px · z∈[-322,-80]mm · 6 of 348 slices shown]
[im 50/348  soft-tissue]
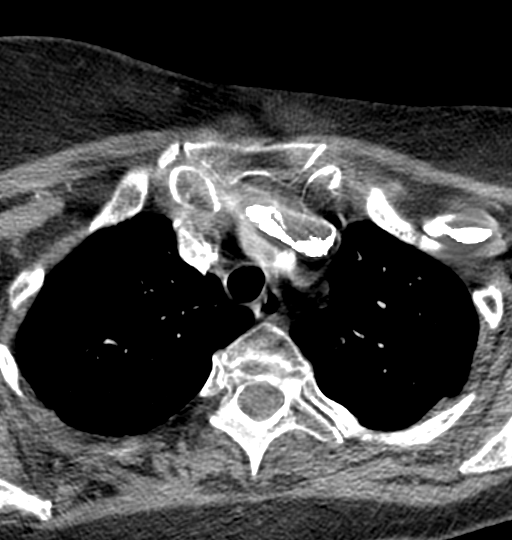
[im 100/348  bone]
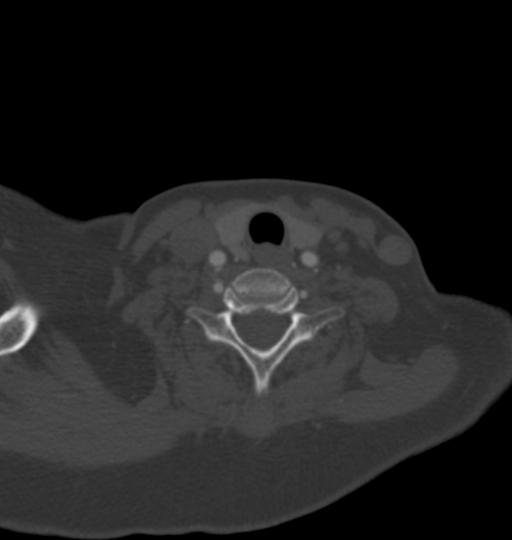
[im 149/348  soft-tissue]
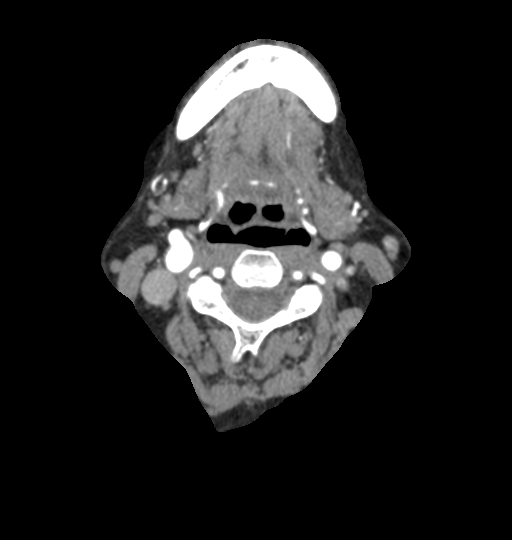
[im 199/348  bone]
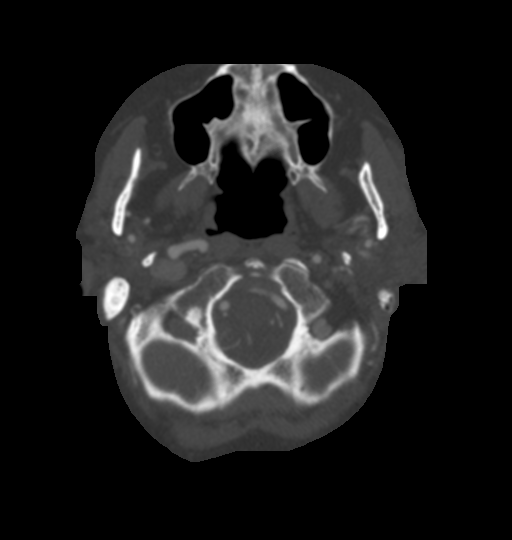
[im 248/348  soft-tissue]
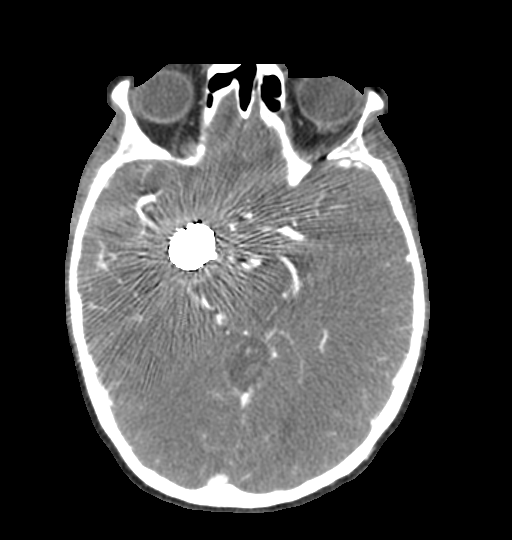
[im 298/348  bone]
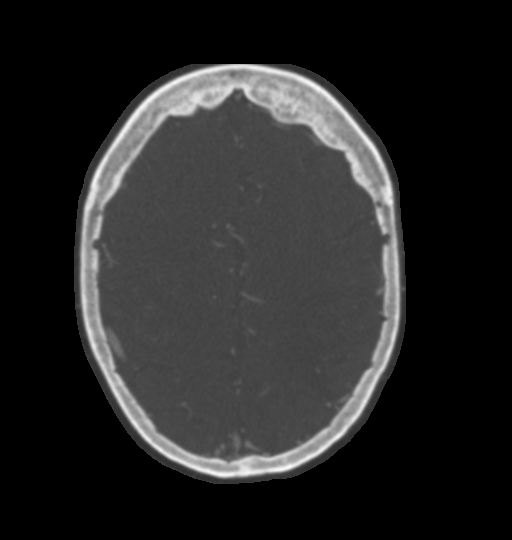

[7 of 36 positions shown; findings below may reference images not displayed]

FINDINGS: CT HEAD FINDINGS

Brain: Extensive streak artifact from prior coil embolization of
right ICA terminus aneurysm.

Cerebral volume within normal limits. Chronic ischemic infarcts
noted involving the right basal ganglia. Additional encephalomalacia
of involving the high left frontoparietal region compatible with
chronic left MCA territory infarct.

No acute intracranial hemorrhage. No visible acute large vessel
territory infarct. No mass lesion or midline shift. No hydrocephalus
or extra-axial fluid collection.

Vascular: Streak artifact from coil embolization of right ICA
terminus aneurysm. No visible hyperdense vessel, although evaluation
limited by artifact.

Skull: Scalp soft tissues and calvarium within normal limits.

Sinuses: Visualized paranasal sinuses and mastoid air cells are
clear.

Orbits: Globes and orbital soft tissues demonstrate no acute
finding.

Review of the MIP images confirms the above findings

CTA NECK FINDINGS

Aortic arch: Evaluation aorta arch somewhat limited by streak
artifact from adjacent venous contamination. Visualized arch grossly
within normal limits for caliber without abnormality. Normal branch
pattern. No hemodynamically significant stenosis seen about the
origin of the great vessels.

Right carotid system: Right common and internal carotid arteries
widely patent without stenosis dissection or occlusion.

Left carotid system: Left common and internal carotid arteries
widely patent without stenosis, dissection or occlusion.

Vertebral arteries: Both vertebral arteries arise from the
subclavian arteries. Right vertebral artery dominant. No proximal
subclavian artery stenosis. Vertebral arteries widely patent without
stenosis, dissection or occlusion.

Skeleton: No visible acute osseous abnormality. No discrete or
worrisome osseous lesions.

Other neck: No other acute soft tissue abnormality within the neck.
No mass or adenopathy.

Upper chest: Visualized upper chest demonstrates no acute finding.

Review of the MIP images confirms the above findings

CTA HEAD FINDINGS

Anterior circulation: Petrous and cavernous right ICA remain widely
patent without abnormality. Extensive streak artifact from prior
coil embolization of giant right ICA terminus aneurysm. Vascular
stent in place from the right ICA terminus to the distal right M1
segment. Artifact limits evaluation of the underlying right ICA
terminus and right M1 segment. No obvious residual flow within the
aneurysm or evidence for recanalization. Grossly patent flow through
the right MCA stent, with widely patent flow seen within the right
MCA branches distally.

Petrous, cavernous, and supraclinoid left ICA remain widely patent.
6 mm superiorly directed aneurysm arising from the left ICA terminus
again seen, stable from previous (series 10, image 252). Left M1
segment widely patent. Normal left MCA bifurcation. Distal left MCA
branches well perfused.

Either A1 segment well visualized due to streak artifact. Focal
tortuosity versus possible 2 mm aneurysm at the anterior
communicating artery complex, grossly stable from previous. Anterior
cerebral arteries well perfused and widely patent distally.

Posterior circulation: Both V4 segments are mildly tortuous but
widely patent to the vertebrobasilar junction. Right V4 dominant.
Both PICA patent. Basilar mildly tortuous but widely patent to its
distal aspect. Superior cerebral arteries remain grossly patent at
their origins. Origins of the PCAs not well assessed due to streak
artifact, but remain grossly patent. PCAs grossly perfused distally
without abnormality.

Venous sinuses: Patent.

Anatomic variants: None significant.

Review of the MIP images confirms the above findings
IMPRESSION: CT HEAD IMPRESSION:

1. No acute intracranial abnormality.
2. Sequelae of prior stent assisted coil embolization of giant right
ICA terminus aneurysm.
3. Chronic right basal ganglia and left MCA distribution infarcts.

CTA HEAD AND NECK IMPRESSION:

1. Extensive streak artifact from prior coil embolization of giant
right ICA terminus aneurysm. No obvious residual flow or evidence
for recanalization within the aneurysm sac itself. Grossly patent
flow through the adjacent right MCA stent with perfusion of the
right MCA branches distally.
2. Stable 6 mm left ICA terminus aneurysm.
3. Stable focal tortuosity versus possible 2 mm anterior
communicating artery aneurysm.
4. Otherwise stable and negative CTA of the head and neck. No large
vessel occlusion, hemodynamically significant stenosis, or other
acute vascular abnormality.

## 2021-11-07 ENCOUNTER — Ambulatory Visit: Payer: Medicare PPO | Admitting: Occupational Therapy

## 2021-11-07 ENCOUNTER — Encounter: Payer: Self-pay | Admitting: Occupational Therapy

## 2021-11-07 DIAGNOSIS — M6281 Muscle weakness (generalized): Secondary | ICD-10-CM

## 2021-11-09 ENCOUNTER — Ambulatory Visit: Payer: Medicare PPO

## 2021-11-09 DIAGNOSIS — M6281 Muscle weakness (generalized): Secondary | ICD-10-CM | POA: Diagnosis not present

## 2021-11-09 DIAGNOSIS — I69159 Hemiplegia and hemiparesis following nontraumatic intracerebral hemorrhage affecting unspecified side: Secondary | ICD-10-CM

## 2021-11-09 DIAGNOSIS — R278 Other lack of coordination: Secondary | ICD-10-CM

## 2021-11-10 NOTE — Therapy (Signed)
OUTPATIENT OCCUPATIONAL THERAPY Discharge NOTE   Patient Name: Erika Stanton MRN: [id number] DOB:1955/09/29, 66 y.o., female Today's Date: 11/10/2021  PCP: Su Ley, MD REFERRING PROVIDER: Alger Simons, MD   OT End of Session - 11/10/21 1926     Visit Number 147    Number of Visits 148    Date for OT Re-Evaluation 11/09/21    Authorization Type Progress report period starting 5/18//23    Authorization Time Period Medicare    OT Start Time 1100    OT Stop Time 1145    OT Time Calculation (min) 45 min    Activity Tolerance Patient tolerated treatment well    Behavior During Therapy [facility] for tasks assessed/performed             Past Medical History:  Diagnosis Date   Aneurysm ([place])    Melena    Past Surgical History:  Procedure Laterality Date   CHOLECYSTECTOMY     COLONOSCOPY  11/2017   at [facility]. no recurrent polyps.  suggest repeat surveillance study 11/2022.     COLONOSCOPY W/ POLYPECTOMY  06/2014   Dr Roxy Manns at [facility].  3 adenomatoous polyps, anal fissure.     ESOPHAGOGASTRODUODENOSCOPY (EGD) WITH PROPOFOL N/A 01/27/2020   Procedure: ESOPHAGOGASTRODUODENOSCOPY (EGD) WITH PROPOFOL;  Surgeon: Mauri Pole, MD;  Location: [place] [facility];  Service: Endoscopy;  Laterality: N/A;   IR 3D INDEPENDENT WKST  01/06/2020   IR ANGIO INTRA EXTRACRAN SEL INTERNAL CAROTID BILAT MOD SED  01/06/2020   IR ANGIO VERTEBRAL SEL VERTEBRAL UNI L MOD SED  01/06/2020   IR ANGIOGRAM FOLLOW UP STUDY  01/06/2020   IR ANGIOGRAM FOLLOW UP STUDY  01/06/2020   IR ANGIOGRAM FOLLOW UP STUDY  01/06/2020   IR ANGIOGRAM FOLLOW UP STUDY  01/06/2020   IR ANGIOGRAM FOLLOW UP STUDY  01/06/2020   IR ANGIOGRAM FOLLOW UP STUDY  01/06/2020   IR ANGIOGRAM FOLLOW UP STUDY  01/06/2020   IR ANGIOGRAM FOLLOW UP STUDY  01/06/2020   IR ANGIOGRAM FOLLOW UP STUDY  01/06/2020   IR ANGIOGRAM FOLLOW UP STUDY  01/06/2020   IR NEURO EACH ADD'L AFTER BASIC UNI RIGHT (MS)  01/06/2020   IR TRANSCATH/EMBOLIZ  01/06/2020    RADIOLOGY WITH ANESTHESIA N/A 01/06/2020   Procedure: IR WITH ANESTHESIA FOR ANEURYSM;  Surgeon: Consuella Lose, MD;  Location: [place];  Service: Radiology;  Laterality: N/A;   TUBAL LIGATION     Patient Active Problem List   Diagnosis Date Noted   Dyspareunia in female 04/05/2020   History of abnormal cervical Pap smear 03/10/2020   GIB (gastrointestinal bleeding) 02/28/2020   Elevated BUN    Prediabetes    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   Cerebral vasospasm    Sinus tachycardia    Dysphagia, post-stroke    Thrombocytopenia (HCC)    Acute blood loss anemia    Brain aneurysm    ICH (intracerebral hemorrhage) ([place]) 01/05/2020   History of adenomatous polyp of colon 02/07/2016   Anatomical narrow angle, bilateral 12/14/2014   Fissure in ano 11/11/2014   Hemorrhoids 11/11/2014   Constipation 11/19/2013   GERD (gastroesophageal reflux disease) 03/24/2013   Irritable bowel syndrome with diarrhea 03/24/2013   Herpes zoster 05/14/2005   Abnormal Pap smear of cervix 05/15/1983   History of cervical dysplasia 05/15/1983      REFERRING DIAG: Aneurysm  THERAPY DIAG:   Weakness. Lack of Coordination  Rationale for Evaluation and Treatment Rehabilitation  PERTINENT HISTORY:   ICH 01/06/20, Aneurysm. PMH:  OA bilateral knees and hands    SUBJECTIVE:   Pt reports no major changes in her R arm.  PAIN:  Are you having pain? No     OBJECTIVE:   TODAY'S TREATMENT:   Therapeutic Exercise:   Pt. tolerated PROM to the right 2nd, and 3rd digits to increase digit MP, PIP, and DIP flexion, thumb MP radial, and ulnar MP abduction, thumb IP flexion in preparation for formulating a fist. Pt. Tolerated PROM, and AAROM for 5th, and 5th digit extension.    Pt. tolerated moist heat prior to ROM. Pt. tolerated ROM with emphasis placed on PROM for right 2nd, and 3rd digit MP, PIP, and DIP flexion 2/2 digit extensor tightness, and 4th & 5th digit extension 2/2 flexor  tightness.  Objective measures taken and goals reviewed for discharge.  OT reviewed HEP; pt indep with AROM/PROM throughout RUE and continues to wear her hand splint at night time.      PATIENT EDUCATION:  Education details: ROM to the digits of the right hand, grasp patterns Person educated: Patient Education method: Explanation and Demonstration Education comprehension: verbalized understanding and returned demo.    HOME EXERCISE PROGRAM   Pt. To continue with her  HEP for ROM to the right hand digits and R shoulder.           OT Short Term Goals - 07/04/21 1355       OT SHORT TERM GOAL #1   Title I with initial HEP.    Time 6    Period Weeks    Status Achieved    Target Date 04/13/20      OT SHORT TERM GOAL #4   Title Pt will demonstrate 60* A/ROM shoulder flexion for RUE with pain less than or equal to 4/10.    Baseline Pt. presents with limited isolated  right shoulder flexion with pain continues to be 2. 08/08/20: 40* AROM with 3/10 pain; 03/07/21: Pt demos 75* active shoulder flexion with 2/10 pain or less. 40th visit: Pt. continues to present with limited shoulder ROM, and pain has decreased; 07/04/21 R shoulder active flexion 85, 3/10 pain; 11/09/21 d/c: active R shoulder flexion and abd 100 with pain 2/10.   Time 4    Period Weeks    Status met   Target Date 08/21/21      OT SHORT TERM GOAL #5   Title Pt will demonstrate improved LUE fine motor coordination for ADLs as evidenced by decreasing 9 hole peg test score to 25 secs or less.    Baseline 10/31/2020: Left FMC: 32 sec. Left FMC 41 sec visa the 9 hole peg test. 08/08/20: 32 seconds via 9 hole peg 40th visit: Left hand: 35 sec.; 07/04/21: L hand 28 sec; 11/09/21 d/c: L hand 33 sec, R unable   Time 12    Period Weeks    Status Partially met    Target Date 08/21/21      OT SHORT TERM GOAL #7   Title Pt will demonstrate at least 25% finger flexion/ extension in RUE in prep for functional use.    Baseline Limited  PROM, and active digit flexion, and extension. Improved AROM for right 2nd, and 3rd digits. significant limitations with the 4th, and 5th digits. 08/08/20: AROM digit MCP 2nd (80*), 3rd (85*), 4th (80*), 5th (90*); 2/21/2: Since Botox injection in Dec, R hand has had limited active movement; see note for details; 11/09/21: see note for ROM measurements    Time 4  Period Weeks    Status Not met    Target Date 09/05/20              OT Long Term Goals - 09/30/21 1608       OT LONG TERM GOAL #1   Title Pt will perform all basic ADLs with supervision    Baseline 01/24/21 assist with bra 12/13/2020 unchanged 6/202/2022: Pt. is able to donn stretchy shirts, donn slide on shoes, donning pants. Pt. has difficylty with managing a bra,shirt buttons, and pant zipper/button.Pt. requires minA from her husband following multiple recent surgeries. Pt. requires minA from her husband. 08/08/20: Pt continues to require MIN A for grooming and dressing tasks; 03/07/21: pt performs UB/LB bathing and dressing with set up (assist with clothing fasteners); min A for grooming d/t unable to manage hair with 2 hands 40th visit: Pt. conitnues to require minA  for self-grooming, Pt. continues to perform UB/LB bathing/dressing, assist with fasteners. 09/28/2021: assist for fastening bra and to put hair in ponytail; 11/09/21 d/c: pt now wears a bra she can step into to avoid the fasteners; spouse continues to assist with putting hair in a ponytail.    Time 12    Period Weeks    Status Partially met   Target Date 11/09/21      OT LONG TERM GOAL #2   Title Pt will demonstrate 60* RUE shoulder flexion in prep for functional reach with pain less than or equal to 3/10.    Baseline 12/13/20:  shoulder flexion right 64 degrees 10/31/20: Pt. continues to present with limited AROM for isolated  right shoulder flexion with pain. 08/08/20: R AROM Shoulder flexion - 45* c 3/10 pain.. 09/28/21: 98 degrees shoulder flexion    Time 12    Period  Weeks    Status Achieved    Target Date 11/09/21      OT LONG TERM GOAL #3   Title Pt will use RUE as a stabilizer/ gross A at least 30 % of the time for ADLs/ IADLs.    Baseline Pt. is using her right hand as a gross stabilizer 15% of the time. 08/08/20: requires MAX cues; 03/07/21 Pt using R hand as a gross stabilizer at least 25% of the time. 40th visit: Pt. has difficulty using her right hand  as a gross stabilizer. 09/28/2021: using about 25% of time as gross assist depending on task; 11/09/21: Pt folds laundry and uses R hand to carry light ADL supplies and light grocery bags.    Time 12    Period Weeks    Status Partially met   Target Date 11/09/21      OT LONG TERM GOAL #4   Title Pt will increase LUE grip strength to 25 lbs or greater for increased ease with ADLs.    Baseline 12/13/20: 24# left, 1# right 10/31/2020: Pt. continues to present with limited left grip. Pt. has improved with left grip strength. Pt.continues to work towards 25#. 08/08/20: L grip 19#; 03/07/21: L grip 21 lbs (R 1 lb) 05/18/2021: Left grip strength: 30#09/28/2021:  grip limited in right measures 1#; 11/09/21 d/c: L grip 28#, R grip 0#    Time 12    Period Weeks    Status met   Target Date 11/09/21      OT LONG TERM GOAL #5   Title 10/31/2020: Pt. is now perfroming laundry, loading the dishwasher, assists some with cooking, light meal preparation, setting the table    Baseline 12/13/2020:  continues  to assist as needed but uses left hand .  10/13/20: Pt. assists with filling the dishwasher, and performing laundry. Pt. continues to try to help when she can, however her husband mostly performs these tasks. 08/08/20: Husband performs; 03/07/21: Pt assists with laundry, loading dishwasher, light cooking/meal prep; mostly using LUE. 05/18/2021: pt. continues to assist with laundry skills, loading the dishwasher, light cooking using the left hand.  09/28/2021: using right hand at times as a stabilizer but still requires assist for  tasks and using left in most tasks.    Time 12    Period Weeks    Status met   Target Date 11/09/21      OT LONG TERM GOAL #6   Title Pt will demonstrate ability to grasp/ release a cup 2/3 trials with RUE.    Baseline 12/13/20:  Continues to work on consistency with grasp and release 10/31/2020: Pt. continues to initiate grasp, and active release of objects, however is unable to grasp/release a cup. 08/08/20: Able to grasp, unable to release x1 trial; 03/05/21: Able to grasp/release cup with min A to keep cup from tipping over.05/18/2021: Pt. is unable to grasp items secondary to changes in the right hand following Botox injections. Pt. has 2nd,a nd 3rd digit PIP extensor tightness. 09/28/2021: pt with extensor tone in index and MF, difficulty with grasping patterns.    Time 12    Period Weeks    Status Not met   Target Date 11/09/21      OT LONG TERM GOAL #7   Title Pt will demonstrate the ability to place clothing on hangers with use of bilateral UE and modified technique.    Baseline unable; 03/07/21: min-mod A, 09/28/2021: min assist    Time 12    Period Weeks    Status Partially met   Target Date 11/09/21      OT LONG TERM GOAL #8   Title I with updated HEP.    Baseline 12/13/2020: Continual changes to HEP 09/12/2020: Pt. continues to require assist for her HEP. Pt. requires assist, and cuing for HEP. 08/08/20: reports completing with husband assist for technique; 03/07/21: min vc. 05/18/2021: Pt. requires assist with HEPs, Husband assists with HEP. 09/28/2021: continue to update HEP; 11/09/21 d/c: indep with HEP   Time 12    Period Weeks    Status Met    Target Date 11/09/21      OT LONG TERM GOAL  #9   TITLE Pt will demonstrate the ability to don and doff robe after shower with modified independence    Baseline unable; 03/07/21: pt reports she avoids her robe (too heavy); uses towel instead. 09/28/2021: new robe which is lighter weight    Time 12    Period Weeks    Status met   Target  Date 11/09/21      OT LONG TERM GOAL  #10   TITLE Pt will demonstrate the ability to perform zippers with modified independence and use of adaptive equipment as needed.    Baseline difficulty; 03/07/21: cues to modify zippers with string loops (has not yet tried) 05/18/2021: TBD, 09/28/2021: continued difficulty and requires assist    Time 12    Period Weeks    Status Not met   Target Date 11/09/21              Plan - 10/31/21 1557     Clinical Impression Statement Pt is showing plateau with objective measures throughout RUE.  Pt is indep with HEP  and reports she is consistent to engage her RUE as often as she can to fold laundry, carry light ADLs supplies, and  use R hand as a stabilizer when able.  Pt continues to be limited with grasping items secondary to changes in the right hand following Botox injections in Dec with noted extensor tightness in the 2nd and 3rd digit PIP or the R hand.  Pt continues to wear her resting hand splint at night time and is indep with HEP for ROM throughout the RUE.  Encouraged pt to continue with HEP and engaging R hand as able into daily tasks, and request new referral from MD if any changes occur in the RUE that need to be addressed with therapy.  Pt verbalized understanding.    OT Occupational Profile and History Detailed Assessment- Review of Records and additional review of physical, cognitive, psychosocial history related to current functional performance    Occupational performance deficits (Please refer to evaluation for details): ADL's;IADL's;Rest and Sleep;Work;Play;Leisure    Body Structure / Function / Physical Skills ADL;Balance;Coordination;Decreased knowledge of precautions;Decreased knowledge of use of DME;Dexterity;Edema;Mobility;Tone;Strength;IADL;Sensation;GMC;Gait;ROM;FMC;Flexibility;Pain;Vision;UE functional use;Endurance    Cognitive Skills Attention;Perception;Problem Solve;Safety Awareness;Thought    Rehab Potential Good    Clinical  Decision Making Several treatment options, min-mod task modification necessary    Comorbidities Affecting Occupational Performance: None    Modification or Assistance to Complete Evaluation  Min-Moderate modification of tasks or assist with assess necessary to complete eval    OT Frequency 2x / week    OT Treatment/Interventions Self-care/ADL training;Ultrasound;Energy conservation;Visual/perceptual remediation/compensation;Patient/family education;DME and/or AE instruction;Aquatic Therapy;Paraffin;Gait Training;Passive range of motion;Balance training;Fluidtherapy;Electrical Stimulation;Functional Mobility Training;Splinting;Moist Heat;Therapeutic exercise;Manual Therapy;Cognitive remediation/compensation;Manual lymph drainage;Neuromuscular education;Coping strategies training    Consulted and Agree with Plan of Care Patient    Family Member Consulted Spose present during the session.            Leta Speller, MS, OTR/L   Darleene Cleaver, OT 11/10/2021, 7:27 PM

## 2021-11-16 ENCOUNTER — Encounter: Payer: Medicare PPO | Attending: Physical Medicine and Rehabilitation | Admitting: Physical Medicine and Rehabilitation

## 2021-11-16 ENCOUNTER — Encounter: Payer: Self-pay | Admitting: Physical Medicine and Rehabilitation

## 2021-11-16 ENCOUNTER — Ambulatory Visit: Payer: Medicare PPO | Admitting: Occupational Therapy

## 2021-11-16 VITALS — BP 128/83 | HR 64 | Ht 67.0 in | Wt 188.6 lb

## 2021-11-16 DIAGNOSIS — R252 Cramp and spasm: Secondary | ICD-10-CM | POA: Diagnosis not present

## 2021-11-16 DIAGNOSIS — I69398 Other sequelae of cerebral infarction: Secondary | ICD-10-CM | POA: Diagnosis not present

## 2021-11-16 DIAGNOSIS — I607 Nontraumatic subarachnoid hemorrhage from unspecified intracranial artery: Secondary | ICD-10-CM | POA: Diagnosis not present

## 2021-11-16 MED ORDER — BACLOFEN 10 MG PO TABS: 10.0000 mg | ORAL_TABLET | Freq: Every evening | ORAL | 11 refills | Status: AC

## 2021-11-16 NOTE — Progress Notes (Signed)
.zts  Subjective:    Patient ID: Erika Stanton, female    DOB: 06-26-1955, 66 y.o.   MRN: [id number]  HPI  Erika Stanton is a 66 year old woman who present for follow-up of cerebral aneurysm rupture.  1) Cerebral aneurysm rupture -she had brain surgery last week and is recovering well -she does have a headache -She continues to be unable to use her right hand and this inhibits her from doing her formed work, for which she needed -she was discharged from OT since her therapist felt she made maximal progress.  -she has graduated from PT -she asks weather her weakness will be present forever  3) Cognitive deficits: - Her husband notes higher level deficits, worst since the brain injury, she is doing overall better.  -She has graduated speech therapy.  -She would like to follow-up with neuropsych- she is following with Dr. Sima Matas  4) Breast cancer -newly diagnosed -Becomes tearful about this, feels overwhelmed  5) Adhesive capsulitis -improving -pain is 2/10 today. 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  -her husband is able to stretch her hand completely flat -she feels that the tightness in her lumbricals worsened after her last Botox which was >6 months ago, she does not want to repeat Botox again today -she has not take any antispasticity medications, but she is willing to try them if they may help.   Pain Inventory Average Pain 2 Pain Right Now 2 My pain is  varies  LOCATION OF PAIN  Hand and fingers  BOWEL Number of stools per week:3-4 Oral laxative use Yes  Type of laxative miralax Enema or suppository use No  History of colostomy No  Incontinent No   BLADDER Normal In and out cath, frequency Able to self cath No  Bladder incontinence No  Frequent urination No  Leakage with coughing No  Difficulty starting stream No  Incomplete  bladder emptying No    Mobility how many minutes can you walk? 10 ability to climb steps?  yes do you drive?  no Do you have any goals in this area?  yes  Function disabled: date disabled 01/05/20 I need assistance with the following:  feeding, dressing, bathing, toileting, meal prep, household duties and shopping  Neuro/Psych No problems in this area  Prior Studies Any changes since last visit?  yes  She has just had her second aneurysm procedure Physicians involved in your care Any changes since last visit?  no   Family History  Problem Relation Age of Onset   Cancer Mother    Congestive Heart Failure Mother    Lung cancer Father    Lung cancer Sister    Social History   Socioeconomic History   Marital status: Married    Spouse name: Not on file   Number of children: Not on file   Years of education: Not on file   Highest education level: Not on file  Occupational History   Not on file  Tobacco Use   Smoking status: Former   Smokeless tobacco: Never  Vaping Use   Vaping Use: Never used  Substance and Sexual Activity   Alcohol use: Yes    Comment: RARE   Drug use: Not Currently   Sexual activity: Not on file  Other Topics Concern   Not on file  Social History Narrative   Not on file  Social Determinants of Health   Financial Resource Strain: Not on file  Food Insecurity: Not on file  Transportation Needs: Not on file  Physical Activity: Not on file  Stress: Not on file  Social Connections: Not on file   Past Surgical History:  Procedure Laterality Date   CHOLECYSTECTOMY     COLONOSCOPY  11/2017   at [facility]. no recurrent polyps.  suggest repeat surveillance study 11/2022.     COLONOSCOPY W/ POLYPECTOMY  06/2014   Dr Roxy Manns at [facility].  3 adenomatoous polyps, anal fissure.     ESOPHAGOGASTRODUODENOSCOPY (EGD) WITH PROPOFOL N/A 01/27/2020   Procedure: ESOPHAGOGASTRODUODENOSCOPY (EGD) WITH PROPOFOL;  Surgeon: Mauri Pole, MD;  Location: [place]  ENDOSCOPY;  Service: Endoscopy;  Laterality: N/A;   IR 3D INDEPENDENT WKST  01/06/2020   IR ANGIO INTRA EXTRACRAN SEL INTERNAL CAROTID BILAT MOD SED  01/06/2020   IR ANGIO VERTEBRAL SEL VERTEBRAL UNI L MOD SED  01/06/2020   IR ANGIOGRAM FOLLOW UP STUDY  01/06/2020   IR ANGIOGRAM FOLLOW UP STUDY  01/06/2020   IR ANGIOGRAM FOLLOW UP STUDY  01/06/2020   IR ANGIOGRAM FOLLOW UP STUDY  01/06/2020   IR ANGIOGRAM FOLLOW UP STUDY  01/06/2020   IR ANGIOGRAM FOLLOW UP STUDY  01/06/2020   IR ANGIOGRAM FOLLOW UP STUDY  01/06/2020   IR ANGIOGRAM FOLLOW UP STUDY  01/06/2020   IR ANGIOGRAM FOLLOW UP STUDY  01/06/2020   IR ANGIOGRAM FOLLOW UP STUDY  01/06/2020   IR NEURO EACH ADD'L AFTER BASIC UNI RIGHT (MS)  01/06/2020   IR TRANSCATH/EMBOLIZ  01/06/2020   RADIOLOGY WITH ANESTHESIA N/A 01/06/2020   Procedure: IR WITH ANESTHESIA FOR ANEURYSM;  Surgeon: Consuella Lose, MD;  Location: [place];  Service: Radiology;  Laterality: N/A;   TUBAL LIGATION     Past Medical History:  Diagnosis Date   Aneurysm ([place])    Melena    BP 128/83   Pulse 64   Ht '5\' 7"'$  (1.702 m)   Wt 188 lb 9.6 oz (85.5 kg)   SpO2 95%   BMI 29.54 kg/m   Opioid Risk Score:   Fall Risk Score:  `1  Depression screen [facility] 2/9     11/16/2021   11:53 AM 08/01/2021    3:32 PM 07/26/2020   11:13 AM 03/22/2020   10:18 AM  Depression screen PHQ 2/9  Decreased Interest 0 0 1 0  Down, Depressed, Hopeless 0 0 1 1  PHQ - 2 Score 0 0 2 1  Altered sleeping    0  Tired, decreased energy    1  Change in appetite    0  Feeling bad or failure about yourself     0  Trouble concentrating    3  Moving slowly or fidgety/restless    0  Suicidal thoughts    0  PHQ-9 Score    5  Difficult doing work/chores    Very difficult    Review of Systems  Constitutional: Negative.   HENT: Negative.    Eyes: Negative.   Respiratory: Negative.    Cardiovascular: Negative.   Gastrointestinal:  Positive for constipation.  Endocrine: Negative.   Genitourinary:  Negative.   Musculoskeletal: Negative.   Skin: Negative.   Allergic/Immunologic: Negative.   Neurological: Negative.   Hematological:  Bruises/bleeds easily.       Plavix  Psychiatric/Behavioral:  Positive for dysphoric mood.   All other systems reviewed and are negative.      Objective:   Physical Exam  Gen: no distress, normal appearing HEENT: oral mucosa pink and moist, NCAT Cardio: Reg rate Chest: normal effort, normal rate of breathing Abd: soft, non-distended Ext: no edema Psych: pleasant, normal affect Skin: intact Neuro: Alert and oriented x3 Musculoskeletal: RUE with MAS 2 in WF, 4th and 5th digit flexion, 1 in elbow, 3 in lumbricals and thumb extension     Assessment & Plan:  Mrs. Slingerland is a 66 year old woman who presents for follow-up of cerebral aneurysm rupture  1) Cerebral aneurysm rupture -discussed etiologies for her condition and what is under her control -she is recovering well from her surgery last week.  -has some headache, otherwise symptom free -continue PT HEP -I will complete disability form today -Neuropsych referral for cognitive testing/counseling  -discussed not to give hope and that there are newer technologies to improve strength such as deep brain stimulation and vagal nerve stimulation that are approved for ischemic stroke but unfortunately not yet approved for hemorrhagic stroke.   2) RUE spasticity: -prescribed baclofen '10mg'$  HS to see if this helps her spasticity -encouraged follow-up with hand surgeon -discussed that she has likely spasticity and would benefit from Botox.  -continue TID stretching, discussed that this is one of the most important things she can do -can consider restarting therapy in January  3) Anxiety:  -continue gabapentin -recommended meditation  4) Breast cancer -provided emotional support -discussed benefits and mechanism of ketogenic diet   5) Impaired mobility and ADLs: -gradually increase walking with a  future goal of 30 minutes per day  6) Adhesive capsulitis: -improved -continue OT  7) Prediabetes: -recommended walking after meals -recommended checking HgbA1c next appointment.  -discussed last glucose was 100.  -avoid sugar, bread, pasta, rice -avoid snacking -perform daily foot exam and at least annual eye exam -try to incorporate into your diet some of the following foods which are good for diabetes: 1) cinnamon- imitates effects of insulin, increasing glucose transport into cells ([place] or [place] cinnamon is best, least processed) 2) nuts- can slow down the blood sugar response of carbohydrate rich foods 3) oatmeal- contains and anti-inflammatory compound avenanthramide 4) whole-milk yogurt (best types are no sugar, [place] yogurt, or goat/sheep yogurt) 5) beans- high in protein, fiber, and vitamins, low glycemic index 6) broccoli- great source of vitamin A and C 7) quinoa- higher in protein and fiber than other grains 8) spinach- high in vitamin A, fiber, and protein 9) olive oil- reduces glucose levels, LDL, and triglycerides 10) salmon- excellent amount of omega-3-fatty acids 11) walnuts- rich in antioxidants 12) apples- high in fiber and quercetin 13) carrots- highly nutritious with low impact on blood sugar 14) eggs- improve HDL (good cholesterol), high in protein, keep you satiated 15) turmeric: improves blood sugars, cardiovascular disease, and protects kidney health 16) garlic: improves blood sugar, blood pressure, pain 17) tomatoes: highly nutritious with low impact on blood sugar

## 2021-11-20 ENCOUNTER — Ambulatory Visit: Payer: Medicare PPO | Admitting: Psychology

## 2021-11-21 ENCOUNTER — Ambulatory Visit: Payer: Medicare PPO

## 2022-02-20 ENCOUNTER — Encounter: Payer: Medicare PPO | Admitting: Physical Medicine and Rehabilitation

## 2022-04-02 ENCOUNTER — Ambulatory Visit: Payer: Medicare PPO | Admitting: Psychology

## 2023-11-09 ENCOUNTER — Encounter (HOSPITAL_COMMUNITY): Payer: Self-pay | Admitting: Interventional Radiology

## 2023-11-14 ENCOUNTER — Encounter: Payer: Self-pay | Admitting: Gastroenterology

## 2023-12-02 ENCOUNTER — Encounter: Admission: RE | Payer: Self-pay | Source: Home / Self Care

## 2023-12-02 ENCOUNTER — Ambulatory Visit: Admission: RE | Admit: 2023-12-02 | Source: Home / Self Care | Admitting: Gastroenterology

## 2023-12-02 ENCOUNTER — Encounter: Payer: Self-pay | Admitting: Anesthesiology

## 2023-12-02 HISTORY — DX: Other symptoms and signs involving cognitive functions and awareness: R41.89

## 2023-12-02 HISTORY — DX: Nontraumatic subarachnoid hemorrhage, unspecified: I60.9

## 2023-12-02 HISTORY — DX: Personal history of cervical dysplasia: Z87.410

## 2023-12-02 HISTORY — DX: Personal history of in-situ neoplasm of breast: Z86.000

## 2023-12-02 HISTORY — DX: Monoplegia of upper limb affecting unspecified side: G83.20

## 2023-12-02 HISTORY — DX: Anatomical narrow angle, bilateral: H40.033

## 2023-12-02 HISTORY — DX: Hydrocephalus, unspecified: G91.9

## 2023-12-02 HISTORY — DX: Unspecified asthma, uncomplicated: J45.909

## 2023-12-02 HISTORY — DX: Osteoarthritis of knee, unspecified: M17.9

## 2023-12-02 HISTORY — DX: Other cerebrovascular vasospasm and vasoconstriction: I67.848

## 2023-12-02 HISTORY — DX: Unspecified hemorrhoids: K64.9

## 2023-12-02 HISTORY — DX: Dysphagia following cerebral infarction: I69.391

## 2023-12-02 HISTORY — DX: Cerebral aneurysm, nonruptured: I67.1

## 2023-12-02 HISTORY — DX: Age-related osteoporosis without current pathological fracture: M81.0

## 2023-12-02 HISTORY — DX: Nontraumatic intracerebral hemorrhage, unspecified: I61.9

## 2023-12-02 HISTORY — DX: Gastro-esophageal reflux disease without esophagitis: K21.9

## 2023-12-02 HISTORY — DX: Irritable bowel syndrome with diarrhea: K58.0

## 2023-12-02 SURGERY — COLONOSCOPY
Anesthesia: General

## 2023-12-02 NOTE — H&P (Signed)
 Patient's procedure canceled today. Staff was notified  Elspeth EMERSON Jungling, DO [facility]

## 2023-12-07 NOTE — Discharge Summary (Signed)
 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 *  Discharge Condition: Stable Discharge Location: Home with Home Health Code Status: Full Code Anticipatory Guidance  Principal Diagnosis: Multifocal R MCA territory ischemic stroke, acute Etiology: ESUS  Secondary Ischemic Stroke Prevention: Statin: atorvastatin 40 mg  Antithrombotic: Aspirin  81 mg PO and Clopidogrel  75 mg    Outpatient follow up studies:  30 day cardiac event monitor,  Hyper-coagulable labs for stroke workup: Thrombosis panel, Lupus anticoagulant, Prothrombin gene mutation, and Factor V Leiden (collected while inpatient, results pending),  MRA for routine monitoring of aneurysms (scheduled as below), and routine monitoring of LDL and A1c.  Follow Up Appointments: Future Appointments  Date Time Provider Department Center  01/08/2024 12:00 PM C PREADMISSION TESTING PAGE [facility]  02/07/2024  1:00 PM Bernardino Alyce Dover, MD [facility]  02/19/2024  1:20 PM Boggess, Feliciano Bathe, DO [facility]  02/25/2024  1:00 PM Evern Thersia Jansky, NP [facility]  02/25/2024  2:00 PM PORT NURSE [facility]  03/06/2024 11:30 AM Maranda Penne Hamilton, NP DESD [facility] [place]  03/06/2024  12:45 PM [facility] [place] [facility]ION [facility]AIR 5 DINFUSION SOUTH [place]  03/09/2024  1:00 PM Benjamine Nat Gaskins, NP [facility]  03/11/2024  6:45 AM DMP MR 1 DMPMRI DMP  03/11/2024  9:00 AM Provenzano, Bernarda Macintosh, PA NEUROSURG1L [facility]  04/07/2024 11:30 AM Bethany Pleas, MD [facility]   04/13/2024  1:20 PM Boggess, Feliciano Bathe, DO [facility]  04/14/2024 11:00 AM Messick, Oneil Barter, MD [facility]  04/24/2024 10:30 AM Shlomo Almarie Fontana, PA [facility]  04/27/2024  2:00 PM Viann Jayson Constable, MD [facility]  07/23/2024  1:30 PM [facility] M[facility] 3 CANCTR [facility]  08/12/2024 11:00 AM [facility] [address]   HPI (initial encounter)  Erika Stanton is a 68 y.o. female with PMH of prior L MCA stroke, bilateral ICA aneurysms s/p coiling, prior SAH and VP shunt placement(Certas at 5), breast cancer, who presents as a stroke code via EMS.    Patient fell after getting out of bed this morning to use the restroom. Hit her head with the fall, but denies any loss of consciousness. EMS was called, reported left upper and lower extremity weakness.    Patient reports she is ambulatory at baseline, uses a cane for support while walking. She is independent in her ADLs. She has residual weakness in her right arm from her prior L MCA stroke.   Initial Stroke Evaluation Last known Normal: 12:00 AM 12/02/23 BP: SBP 130s  Glucose: 151 Anticoagulation PTA?: None Antithrombotic therapy PTA?: Aspirin  81 mg    IV thrombolytics administered?:   Yes/No, No  Date/Time given:    Thrombectomy: No, taken for DSA but no LVO  Hospital Course by Problem List  #Multifocal R MCA territory ischemic stroke, acute #Prior SAH and VPS with  bilateral ICA aneurysms coiled  #Hx of L MCA stroke CT and CTA were confounded by streak artifact but were unremarkable for ICH or LVO. CT perfusion demonstrated perfusion abnormality in R MCA  territory c/w ischemia. Erika Stanton was taken for DSA on 7/21 which revealed no LVO, with some residual filling of the previously coiled large R ICA aneurysm. She was initially admitted to the NICU s/p DSA. Subsequently, MRI demonstrated distal posterior MCA territory wedge stroke. Per NSGY recommendations, Erika Stanton was started on Plavix  in addition to her daily aspirin  given the residual filling of the R ICA aneurysm with plan to f/u in the OP setting with MRA. Plavix  duration to be determined at time of NSGY f/u.  Etiology for this stroke is consistent with embolism given multifocal acute infarcts in the distal R MCA territory but without a clear source. Review of her prior stroke history reveals events associated with procedures (L ACA infarct s/p stent/coil of ruptured large R ICA terminus aneurysm 12/2019, R ICA terminus aneurysm retreatment with coiling c/b clot formation s/p stent from MCA to distal intracranial ICA 10/2022). Imaging and procedures have been suggestive against a cardioembolic source. TTE obtained during this hospitalization was with normal biventricular function, however a bubble study was not obtained to confirm absence of PFO. Reassuringly, she has had a prior TTE with bubble which was normal (12/19/20). She also has had prior loop monitoring without detected events (02/13/21), but we will repeat this OP given this event was not associated with a procedure (heart monitor placed prior to discharge). With her history of breast cancer, it is important to consider malignancy leading to a hypercoagulable state. She has been on endocrine therapy with anastrozole given estrogen-receptor positive DCIS, which notably is not associated with increased risk for VTE in comparison with placebo. Reassuringly, CT CAP was without evidence of masses, lymphadenopathy, or thrombosis. Additional hypercoagulability workup was pursued to assess for conditions putting her susceptible to recurrent stroke (Anti-beta 2  glycoprotein 1 and Anti-cardiolipin negative thus far, with results of Thrombosis panel, Lupus anticoagulant, Prothrombin gene mutation, and Factor V Leiden pending).CTA is suggestive against LAA; however, atorvastatin was initiated given her LDL was elevated.   #Hyperlipidemia Atorvastatin 40mg  daily was initiated for stroke prevention in the setting of LDL elevated to 130.  #Peripheral neuropathy Erika Stanton's home gabapentin  was continued.  #Constipation due to immobility Erika Stanton was placed on a bowel regimen with senna-s BID/miralax  qd.  #Hx breast cancer Erika Stanton's home anastrozole was held in the setting of stroke workup. As above, this was determined to be appropriate for resumption at discharge.           #GERD Erika Stanton was continued on her home pantoprazole .   Physical Exam at Discharge   Vital signs: Temp:  [36.6 C (97.8 F)-36.9 C (98.4 F)] 36.8 C (98.2 F) Heart Rate:  [74-93] 86 Resp:  [14-21] 16 BP: (104-127)/(56-85) 123/84 Temp (24hrs), Avg:36.8 C (98.2 F), Min:36.6 C (97.8 F), Max:36.9 C (98.4 F)  SpO2: 96 % Weight: 86.4 kg (190 lb 7.6 oz)  GENERAL: Normal appearance, no acute distress. EYES: No erythema or drainage, no gross abnormalities Cardiovascular: Observation reveals no significant swelling; palpation reveals good peripheral perfusion. MSK: Full passive range of motion of all major joints without pain. No atrophy or fasciculations. With contractures in R hand which are present at baseline s/p L MCA stroke.  NEURO: MENTAL STATUS:  Alert. Attention and concentration is good. Oriented to person, place, and time. Speech  is clear. Language is fluent with good repetition, comprehension, and naming.  CRANIAL NERVES:  II: Visual fields - full , PERRL III, IV, VI: No deviation at primary gaze, extraocular movements intact V: Facial sensation symmetric to light touch and pinprick bilaterally VII: Facial movements - LT complete facial palsy  (with some activation of L eyebrow, unable to hold air in L cheek, asymmetric smile, improved vs admission) VIII: Hearing intact bilaterally IX/X: Palate elevation symmetric bilaterally XI: Shoulder shrug symmetric bilaterally XII: Tongue protrusion midline TONE: Increased tone in R hand with contractures. Otherwise, muscle tone is normal bilaterally. STRENGTH: There is no pronator drift of out-stretched arms.    Dlt Bic Tri Grp HF KnF KnE PlF DoF  L 5 4+ 4+ 4+ 5 5 5 5 5   R 4 4 4  4- 5 4 5 5 4    SENSORY: No lateralizing signs or deficits on light touch, pinprick, or position sense, in all extremities. Decreased vibration to level of knees bilaterally. No extinction on bilateral simultaneous stimulation. REFLEXES: 3+ and symmetric biceps, brachioradialis; 1+ knees; plantar response flexor.  COORDINATION/MOVEMENT: No dysmetria on finger-to-nose or heel-to-shin. No truncal ataxia. No abnormal movements.  GAIT: per PT assessment- narrow base of support, decreased push off, shuffle, trunk forward flexion, decreased step length bilaterally  Discharge NIHSS:      Initial NIHSS 14 Time and date: 0810 12/02/23   Mental Status:   1a  Level of consciousness: 0=alert; keenly responsive  1b. LOC questions:  0=Performs both tasks correctly  1c. LOC commands: 0=Performs both tasks correctly   CN:   2.  Best Gaze: 0=normal  3. Visual: 0=No visual loss  4. Facial Palsy: 1=Minor paralysis (flattened nasolabial fold, asymmetric on smiling)   Motor:   5a. Motor left arm: 0=No drift, limb holds 90 (or 45) degrees for full 10 seconds  5b.  Motor right arm: 0=No drift, limb holds 90 (or 45) degrees for full 10 seconds  6a. motor left leg: 0=No drift, limb holds 90 (or 45) degrees for full 10 seconds  6b  Motor right leg:  0=No drift, limb holds 90 (or 45) degrees for full 10 seconds   Cortical   7. Limb Ataxia: 0=Absent  8.  Sensory: 0=Normal; no sensory loss  9. Best Language:  0=No aphasia, normal  10.  Dysarthria: 0=Normal  11. Extinction and Inattention: 0=No abnormality   Total:   1  Limitations marked by asterisk and described in full exam on left with corresponding asterisk.  Discharge Estimated Modified Rankin Score: 4 - Moderately severe disability. Unable to attend to own bodily needs without assistance, and unable to walk unassisted. (Occasionally walked with cane prior to this stroke, now requires walker for ambulation.)  Discharge Medications  Discharge Medications:   Medication List     START taking these medications    atorvastatin 40 MG tablet Commonly known as: LIPITOR Take 1 tablet (40 mg total) by mouth once daily Start taking on: December 08, 2023   clopidogreL  75 mg tablet Commonly known as: PLAVIX  Take 1 tablet (75 mg total) by mouth once daily Start taking on: December 08, 2023       CHANGE how you take these medications    gabapentin  300 MG capsule Commonly known as: NEURONTIN  TAKE 1 CAPSULE (300 MG TOTAL) BY MOUTH 3 (THREE) TIMES DAILY FOR 90 DAYS What changed: when to take this       CONTINUE taking these medications    acetaminophen  500 MG tablet Commonly  known as: TYLENOL    anastrozole 1 mg tablet Commonly known as: ARIMIDEX Take 1 tablet (1 mg total) by mouth once daily   aspirin  81 mg Cap   brimonidine 0.2 % ophthalmic solution Commonly known as: ALPHAGAN Place 1 drop into the right eye 2 (two) times daily   calcium  carbonate 300 mg (750 mg) chewable tablet Commonly known as: TUMS E-X   calcium  carbonate-vitamin D3 500 mg-10 mcg (400 unit) tablet Commonly known as: OS-CAL 500+D   cholecalciferol 1000 unit tablet   COLACE ORAL   dexAMETHasone  2 MG tablet Commonly known as: DECADRON  Take one tablet the morning of the infusion and then take one tablet each morning for two days after your infusion.   diazePAM 5 MG tablet Commonly known as: VALIUM TAKE 30 MIN PRIOR TO MRI   diclofenac 1 % topical gel Commonly known as: VOLTAREN    latanoprost 0.005 % ophthalmic solution Commonly known as: XALATAN Place 1 drop into both eyes at bedtime   loratadine 10 mg tablet Commonly known as: CLARITIN   multivitamin tablet   pantoprazole  20 MG DR tablet Commonly known as: PROTONIX  TAKE 1 TABLET BY MOUTH EVERY DAY   polyethylene glycol powder Commonly known as: MIRALAX    PROBIOTIC ORAL   sennosides-docusate 8.6-50 mg tablet Commonly known as: SENOKOT-S Take 2 tablets by mouth 2 (two) times daily   SLOW-MAG ORAL         Where to Get Your Medications     These medications were sent to [facility]  [address], [place] [address]    Hours: 8:30-5 MON-FRI Phone: [phone]  atorvastatin 40 MG tablet clopidogreL  75 mg tablet      Pertinent Diagnostics and Imaging  CT Brain w/o contrast, 12/02/23:  IMPRESSION:  1.  Evaluation is limited by streak artifact from large embolization coils. Within this limitation no large vascular territory infarct. No acute intracranial hemorrhage.  2.  Large left frontal scalp hematoma and laceration just anterior to the left frontal approach ventricular shunt catheter, with associated scalp hematoma that surround the catheter and extending to the left frontal burr hole.    CTA Head and Neck w/ & w/o contrast, 12/02/23: IMPRESSION: 1.  Unchanged coiling of the bilateral carotid termini and right ICA terminus clipping with significant streak artifact, right greater than left. Unchanged cutoff at the right carotid terminus with nonvisualization of the right M1 and A1 segments. Of note, there is filling of the right distal M3 branches above the level of streak artifact. 2.  No hemodynamically significant arterial stenosis in the neck.    MRI Brain w/o contrast, 12/03/23: IMPRESSION:  1. Multifocal acute infarcts within the right MCA territory, as above.  Distribution may reflect thromboembolic etiology from the more proximal right MCA. 2.  Please note that considerable susceptibility artifact from left frontal approach ventriculostomy shunt catheter obscures a majority of the left hemisphere limiting evaluation and precluding assessment for infarct in these locations.   TTE, 12/03/23:  NORMAL LEFT VENTRICULAR SYSTOLIC FUNCTION WITH NO LVH ESTIMATED EF: >55%, CALC EF(2D): 76% NORMAL LA PRESSURES WITH NORMAL DIASTOLIC FUNCTION NORMAL RIGHT VENTRICULAR SYSTOLIC FUNCTION VALVULAR REGURGITATION: No AR, No MR, TRIVIAL PR, TRIVIAL TR ESTIMATED RVSP: 27 mmHg (Normal) NO VALVULAR STENOSIS   CT CAP, 12/05/23: Impression: 1.  No evidence of primary or metastatic disease within the chest, abdomen, or pelvis.  2.  Mild hepatic steatosis.  Procedures  12/02/23- Diagnostic cerebral angiogram 1. Bilateral common carotid arteriograms.  2.  Right internal carotid arteriograms.  3. Right femoral arteriogram.     Findings: 1. Right common carotid artery injection demonstrates normal carotid bifurcation with no evidence of stenosis or FMD.   2.  Right internal carotid arteriogram demonstrates antegrade filling of the  middle cerebral arteries. Capillary and venous phases are unremarkable.  There is  small residual filling of the neck of the right ICA aneurysm.  3. Left common carotid artery injection demonstrates normal carotid bifurcation with no evidence of stenosis or FMD. Right ACA filling through Acom. 4. Right femoral arteriogram demonstrates common femoral puncture site below the level of the inferior epigastric artery with no flow-limiting stenosis or dissection.     IMPRESSION:  No evidence of large vessel occlusion. Right ICA giant aneurysm s/p stent coiling with small residual neck filling.  Consults  IP CONSULT TO NEUROLOGY IP CONSULT TO [facility] IP CONSULT TO [facility] Population Specific Quality Measures   Stroke Education: Discussed patient specific risk factors of stroke including hyperlipidemia,  known cerebrovascular disease, prior stroke , and prediabetes. Further education also provided on comorbid medical conditions, tobacco use, medication adherence, maintaining a healthy BMI. Once has had some recovery from stroke and is able to make changes and participate, we also discussed lifestyle modifications like dietary changes and at least 150 minutes per week of moderate-intensity aerobic activity or 75 minutes per week of vigorous aerobic activity, or a combination of both, preferably spread throughout the week, as applicable to each patient. Post-Stroke Cognitive Assessment: per OT and Speech notes PT DC Recommendations (autopopulates when complete): Home;Outpatient PT (12/07/23 1126)   Services Setup at Discharge [facility] health, Nursing, Infusion, PT/OT):  PT and OT Wound Care: none needed Wound Care Order Instructions     None      Tubes/Lines at Discharge: none For pending tests please use the following [facility] numbers:  Laboratory: [phone] Microbiology: [phone] Pathology: [phone] Radiology: [phone]  Time spent: 30 minutes Deneice Railing, MD [facility] Resident, PGY-3 DENEICE RAILING, MD 12/07/2023   ------------------------------------------------------------------------------- Attestation signed by Margart Flower, Iantha Muse, MD at 12/10/2023  7:24 AM Attestation Statement:   I personally saw and evaluated the patient, and participated in the management and treatment plan as documented in the resident/fellow note.  DAPT for three months and until follow-up by NSU. This is per NSU for concern for embolization from base of coiled right MCA aneurysm Patient with baseline right sided weakness from left MCA ischemic stroke Minimal left sided weakness from new stroke 30 days heart monitor at discharge She is on anastrazole which may possibly increase the risk of hypercoagulability but was continued given considerations for risk vs. Benefit  Lab  Results  Component Value Date   LDLCALC 130 (H) 12/02/2023   Lab Results  Component Value Date   HGBA1C 6.1 (H) 12/03/2023   HGBA1C 6.1 (H) 10/01/2023   Vitals:   12/07/23 0750 12/07/23 0815 12/07/23 0849 12/07/23 1030  BP:  119/75  123/84  BP Location:  Right upper arm  Right upper arm  Patient Position:  Lying  Sitting  Pulse:  90 93 86  Resp:  18 16 16   Temp: 36.8 C (98.2 F)     TempSrc: Oral     SpO2:  97% 95% 96%  Weight:      Height:         NADA KAIS MARGART FLOWER, MD  -------------------------------------------------------------------------------

## 2023-12-10 ENCOUNTER — Ambulatory Visit: Attending: Family Medicine

## 2023-12-10 ENCOUNTER — Ambulatory Visit: Admitting: Occupational Therapy

## 2023-12-10 DIAGNOSIS — R296 Repeated falls: Secondary | ICD-10-CM | POA: Insufficient documentation

## 2023-12-10 DIAGNOSIS — M6281 Muscle weakness (generalized): Secondary | ICD-10-CM | POA: Insufficient documentation

## 2023-12-10 DIAGNOSIS — R278 Other lack of coordination: Secondary | ICD-10-CM | POA: Diagnosis present

## 2023-12-10 DIAGNOSIS — R269 Unspecified abnormalities of gait and mobility: Secondary | ICD-10-CM | POA: Diagnosis present

## 2023-12-10 DIAGNOSIS — R262 Difficulty in walking, not elsewhere classified: Secondary | ICD-10-CM | POA: Diagnosis present

## 2023-12-10 DIAGNOSIS — R2689 Other abnormalities of gait and mobility: Secondary | ICD-10-CM | POA: Diagnosis present

## 2023-12-10 NOTE — Therapy (Incomplete)
 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 no recurrent polyps.  suggest repeat surveillance study 11/2022.     COLONOSCOPY W/ POLYPECTOMY  06/2014   Dr Elder at [facility].  3 adenomatoous polyps, anal fissure.     ESOPHAGOGASTRODUODENOSCOPY (EGD) WITH PROPOFOL  N/A 01/27/2020   Procedure: ESOPHAGOGASTRODUODENOSCOPY (EGD) WITH PROPOFOL ;  Surgeon: Shila Gustav GAILS, MD;  Location: [facility] END[facility];  Service: Endoscopy;  Laterality: N/A;   INSERTION OF PERMANENT INTRAPERITONEAL CANNULA/CATH N/A    LAPAROSCOPIC   IR 3D INDEPENDENT WKST  01/06/2020   IR ANGIO INTRA EXTRACRAN SEL INTERNAL CAROTID BILAT MOD SED  01/06/2020   IR ANGIO VERTEBRAL SEL VERTEBRAL UNI L MOD SED  01/06/2020   IR ANGIOGRAM FOLLOW UP STUDY  01/06/2020   IR ANGIOGRAM FOLLOW UP STUDY  01/06/2020   IR ANGIOGRAM FOLLOW UP STUDY  01/06/2020   IR ANGIOGRAM FOLLOW UP STUDY  01/06/2020   IR ANGIOGRAM FOLLOW UP STUDY  01/06/2020   IR ANGIOGRAM FOLLOW UP STUDY  01/06/2020   IR ANGIOGRAM FOLLOW UP STUDY  01/06/2020    IR ANGIOGRAM FOLLOW UP STUDY  01/06/2020   IR ANGIOGRAM FOLLOW UP STUDY  01/06/2020   IR ANGIOGRAM FOLLOW UP STUDY  01/06/2020   IR NEURO EACH ADD'L AFTER BASIC UNI RIGHT (MS)  01/06/2020   IR TRANSCATH/EMBOLIZ  01/06/2020   MASTECTOMY     MOHS SURGERY N/A    RADIOLOGY WITH ANESTHESIA N/A 01/06/2020   Procedure: IR WITH ANESTHESIA FOR ANEURYSM;  Surgeon: Lanis Pupa, MD;  Location: [facility] OR;  Service: Radiology;  Laterality: N/A;   STENT SUPPORTED EMBOLIZATION OF RIGHT ICA ANEURYSM Right    TUBAL LIGATION     VENTRICULO-PERITONEAL SHUNT PLACEMENT / LAPAROSCOPIC INSERTION PERITONEAL CATHETER N/A    Patient Active Problem List   Diagnosis Date Noted   Dyspareunia in female 04/05/2020   History of abnormal cervical Pap smear 03/10/2020   GIB (gastrointestinal bleeding) 02/28/2020   Elevated BUN    Prediabetes    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   Cerebral vasospasm    Sinus tachycardia    Dysphagia, post-stroke    Thrombocytopenia (HCC)    Acute blood loss anemia    Brain aneurysm    ICH (intracerebral hemorrhage) (HCC) 01/05/2020   History of adenomatous polyp of colon 02/07/2016   Anatomical narrow angle, bilateral 12/14/2014   Fissure in ano 11/11/2014   Hemorrhoids 11/11/2014   Constipation 11/19/2013  GERD (gastroesophageal reflux disease) 03/24/2013   Irritable bowel syndrome with diarrhea 03/24/2013   Herpes zoster 05/14/2005   Abnormal Pap smear of cervix 05/15/1983   History of cervical dysplasia 05/15/1983    ONSET DATE: ***  REFERRING DIAG: ***  THERAPY DIAG:  No diagnosis found.  Rationale for Evaluation and Treatment: {HABREHAB:27488}  SUBJECTIVE:   SUBJECTIVE STATEMENT: *** Pt accompanied by: {accompnied:27141}  PERTINENT HISTORY: ***  PRECAUTIONS: {Therapy precautions:24002}  WEIGHT BEARING RESTRICTIONS: {Yes ***/No:24003}  PAIN:  Are you having pain? {OPRCPAIN:27236}  FALLS: Has patient fallen in last 6 months? Yes. Number  of falls 1  LIVING ENVIRONMENT: Lives with: lives with their family and lives with their spouse Lives in: House/apartment Stairs: hand rails, 2 steps   Has following equipment at home: [name], cane  PLOF: Independent with basic ADLs, Spouse help as needed  PATIENT GOALS: Strengthening  OBJECTIVE:  Note: Objective measures were completed at Evaluation unless otherwise noted.  HAND DOMINANCE: Left  ADLs: Overall ADLs:  Transfers/ambulation related to ADLs: Eating: Able to cut foods with a fork, husband assists as needed.  Grooming: husband assists with applying toothpaste, able to brush teeth and hair UB Dressing: Spouse assists with applying/fastening bra, Pt. Requires assistance with zippers, and buttons. LB Dressing: Spouse assists with tying shoes when needed, uses slip on shoes. Pt. Does not zip pants. Toileting: Independently Bathing: Pt. Requires intermittent assistance with bathing tasks. Tub Shower transfers:  Equipment: Information systems manager with back  IADLs: Shopping: Spouse typically does the shopping, isnt able to carry heavy objects.  Light housekeeping: Spouse typically does most of the cleaning around the home, Pt. Cleans bathroom, and laundry Pt. Is unable to carry laundry due to exacerbation of weakness.  Meal Prep: Spouse typically cooks and prepares meals.  Community mobility:  Medication management: Pt. Is able to take care of meds with pillbox Financial management:  Spouse takes care of it, and makes online payments.  Handwriting: {OTWRITTENEXPRESSION:25361}  MOBILITY STATUS: {OTMOBILITY:25360}  POSTURE COMMENTS:  {posture:25561} Sitting balance: {sitting balance:25483}  ACTIVITY TOLERANCE: Activity tolerance: ***  FUNCTIONAL OUTCOME MEASURES: {OTFUNCTIONALMEASURES:27238}  UPPER EXTREMITY ROM:    {AROM/PROM:27142} ROM Right eval Left eval  Shoulder flexion    Shoulder abduction    Shoulder adduction    Shoulder extension    Shoulder internal  rotation    Shoulder external rotation    Elbow flexion    Elbow extension    Wrist flexion    Wrist extension    Wrist ulnar deviation    Wrist radial deviation    Wrist pronation    Wrist supination    (Blank rows = not tested)  UPPER EXTREMITY MMT:     MMT Right eval Left eval  Shoulder flexion    Shoulder abduction    Shoulder adduction    Shoulder extension    Shoulder internal rotation    Shoulder external rotation    Middle trapezius    Lower trapezius    Elbow flexion    Elbow extension    Wrist flexion    Wrist extension    Wrist ulnar deviation    Wrist radial deviation    Wrist pronation    Wrist supination    (Blank rows = not tested)  HAND FUNCTION: {handfunction:27230}  COORDINATION: {otcoordination:27237}  SENSATION: {sensation:27233}  EDEMA: ***  MUSCLE TONE: {UETONE:25567}  COGNITION: Overall cognitive status: {cognition:24006}  VISION: Subjective report: *** Baseline vision: {OTBASELINEVISION:25363} Visual history: {OTVISUALHISTORY:25364}  VISION ASSESSMENT: {visionassessment:27231}  Patient has difficulty with following  activities due to following visual impairments: ***  PERCEPTION: {Perception:25564}  PRAXIS: {Praxis:25565}  OBSERVATIONS: ***                                                                                                                             TREATMENT DATE: ***         PATIENT EDUCATION: Education details: *** Person educated: {Person educated:25204} Education method: {Education Method:25205} Education comprehension: {Education Comprehension:25206}  HOME EXERCISE PROGRAM: ***   GOALS: Goals reviewed with patient? {yes/no:20286}  SHORT TERM GOALS: Target date: ***  *** Baseline: Goal status: INITIAL  2.  *** Baseline:  Goal status: INITIAL  3.  *** Baseline:  Goal status: INITIAL  4.  *** Baseline:  Goal status: INITIAL  5.  *** Baseline:  Goal status: INITIAL  6.   *** Baseline:  Goal status: INITIAL  LONG TERM GOALS: Target date: ***  *** Baseline:  Goal status: INITIAL  2.  *** Baseline:  Goal status: INITIAL  3.  *** Baseline:  Goal status: INITIAL  4.  *** Baseline:  Goal status: INITIAL  5.  *** Baseline:  Goal status: INITIAL  6.  *** Baseline:  Goal status: INITIAL  ASSESSMENT:  CLINICAL IMPRESSION: Patient is a *** y.o. *** who was seen today for occupational therapy evaluation for ***.   PERFORMANCE DEFICITS: in functional skills including {OT physical skills:25468}, cognitive skills including {OT cognitive skills:25469}, and psychosocial skills including {OT psychosocial skills:25470}.   IMPAIRMENTS: are limiting patient from {OT performance deficits:25471}.   CO-MORBIDITIES: {Comorbidities:25485} that affects occupational performance. Patient will benefit from skilled OT to address above impairments and improve overall function.  MODIFICATION OR ASSISTANCE TO COMPLETE EVALUATION: {OT modification:25474}  OT OCCUPATIONAL PROFILE AND HISTORY: {OT PROFILE AND HISTORY:25484}  CLINICAL DECISION MAKING: {OT CDM:25475}  REHAB POTENTIAL: {rehabpotential:25112}  EVALUATION COMPLEXITY: {Evaluation complexity:25115}    PLAN:  OT FREQUENCY: {rehab frequency:25116}  OT DURATION: {rehab duration:25117}  PLANNED INTERVENTIONS: {OT Interventions:25467}  RECOMMENDED OTHER SERVICES: ***  CONSULTED AND AGREED WITH PLAN OF CARE: {ENR:74513}  PLAN FOR NEXT SESSION: ***   Damien Nap, Student-OT 12/10/2023, 2:02 PM

## 2023-12-10 NOTE — Therapy (Signed)
 OUTPATIENT PHYSICAL THERAPY NEURO EVALUATION   Patient Name: Erika Stanton MRN: [id number] DOB:1955-09-23, 68 y.o., female Today's Date: 12/11/2023   PCP: Dr. Oneil Galloway  REFERRING PROVIDER: Dr. Oneil Galloway  END OF SESSION:  PT End of Session - 12/10/23 1339     Visit Number 1    Number of Visits 24    Date for PT Re-Evaluation 03/03/24    PT Start Time 1315    PT Stop Time 1359    PT Time Calculation (min) 44 min    Equipment Utilized During Treatment Gait belt    Activity Tolerance Patient tolerated treatment well    Behavior During Therapy WFL for tasks assessed/performed          Past Medical History:  Diagnosis Date   Anatomical narrow angle, bilateral    Aneurysm (HCC)    Asthma    Cerebral aneurysm    Cerebral vasospasm    Cognitive change    Dysphagia, post-stroke    GERD (gastroesophageal reflux disease)    Hemorrhoids    History of cervical dysplasia    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 no recurrent polyps.  suggest repeat surveillance study 11/2022.     COLONOSCOPY W/ POLYPECTOMY  06/2014   Dr Elder at [facility].  3 adenomatoous polyps, anal fissure.     ESOPHAGOGASTRODUODENOSCOPY (EGD) WITH PROPOFOL  N/A 01/27/2020   Procedure: ESOPHAGOGASTRODUODENOSCOPY (EGD) WITH PROPOFOL ;  Surgeon: Shila Gustav GAILS, MD;  Location: [facility] ENDOSCOPY;  Service: Endoscopy;  Laterality: N/A;   INSERTION OF PERMANENT INTRAPERITONEAL CANNULA/CATH N/A    LAPAROSCOPIC   IR 3D INDEPENDENT WKST  01/06/2020   IR ANGIO INTRA EXTRACRAN SEL  INTERNAL CAROTID BILAT MOD SED  01/06/2020   IR ANGIO VERTEBRAL SEL VERTEBRAL UNI L MOD SED  01/06/2020   IR ANGIOGRAM FOLLOW UP STUDY  01/06/2020   IR ANGIOGRAM FOLLOW UP STUDY  01/06/2020   IR ANGIOGRAM FOLLOW UP STUDY  01/06/2020   IR ANGIOGRAM FOLLOW UP STUDY  01/06/2020   IR ANGIOGRAM FOLLOW UP STUDY  01/06/2020   IR ANGIOGRAM FOLLOW UP STUDY  01/06/2020   IR ANGIOGRAM FOLLOW UP STUDY  01/06/2020   IR ANGIOGRAM FOLLOW UP STUDY  01/06/2020   IR ANGIOGRAM FOLLOW UP STUDY  01/06/2020   IR ANGIOGRAM FOLLOW UP STUDY  01/06/2020   IR NEURO EACH ADD'L AFTER BASIC UNI RIGHT (MS)  01/06/2020   IR TRANSCATH/EMBOLIZ  01/06/2020   MASTECTOMY     MOHS SURGERY N/A    RADIOLOGY WITH ANESTHESIA N/A 01/06/2020   Procedure: IR WITH ANESTHESIA FOR ANEURYSM;  Surgeon: Lanis Pupa, MD;  Location: [facility] OR;  Service: Radiology;  Laterality: N/A;   STENT SUPPORTED EMBOLIZATION OF RIGHT ICA ANEURYSM Right    TUBAL LIGATION     VENTRICULO-PERITONEAL SHUNT PLACEMENT / LAPAROSCOPIC INSERTION PERITONEAL CATHETER N/A    Patient Active Problem List   Diagnosis Date Noted   Dyspareunia in female 04/05/2020   History of abnormal cervical Pap  smear 03/10/2020   GIB (gastrointestinal bleeding) 02/28/2020   Elevated BUN    Prediabetes    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   Cerebral vasospasm    Sinus tachycardia    Dysphagia, post-stroke    Thrombocytopenia (HCC)    Acute blood loss anemia    Brain aneurysm    ICH (intracerebral hemorrhage) (HCC) 01/05/2020   History of adenomatous polyp of colon 02/07/2016   Anatomical narrow angle, bilateral 12/14/2014   Fissure in ano 11/11/2014   Hemorrhoids 11/11/2014   Constipation 11/19/2013   GERD (gastroesophageal reflux disease) 03/24/2013   Irritable bowel syndrome with diarrhea 03/24/2013   Herpes zoster 05/14/2005   Abnormal Pap smear of cervix 05/15/1983   History of cervical dysplasia 05/15/1983    ONSET DATE: 12/02/2023  REFERRING  DIAG: I63.9 (ICD-10-CM) - CVA (cerebral vascular accident) (HCC)   THERAPY DIAG:  Abnormality of gait and mobility  Difficulty in walking, not elsewhere classified  Muscle weakness (generalized)  Other abnormalities of gait and mobility  Other lack of coordination  Repeated falls  Rationale for Evaluation and Treatment: Rehabilitation  SUBJECTIVE:                                                                                                                                                                                             SUBJECTIVE STATEMENT: Patient reports she was in hospital from [facility]- Sat (July 21st- 25th) with CVA affecting left side. I was supposed to have a knee replacement surgery in September but that is on hold for now. I was doing my prep for my colonoscopy and fell in the bathroom.  Patient reports having mild left sided weakness compared to her CVA in 2021 with heavy Right sided UE weakness. She reports bad OA affecting both knees as well.    Pt accompanied by: Husband brought patient  PERTINENT HISTORY: Previous CVA in 2021 affecting Right side. Reports going to ED  last Monday due to fall/CVA affecting Left side. Diagnosed with R CVA with Left Sided weakness.    PAIN:  Are you having pain? Yes: NPRS scale: Current = 1/10; worst= 6/10 Pain location: Bilateral knees- Arthritis  Pain description: achy yet sharp with weight bearing Aggravating factors: Increased walking, transfers Relieving factors: rest  PRECAUTIONS: Fall  RED FLAGS: None   WEIGHT BEARING RESTRICTIONS: No  FALLS: Has patient fallen in last 6 months? Yes. Number of falls fell with the stroke  LIVING ENVIRONMENT: Lives with: lives with their spouse Lives in: House/apartment Stairs: Yes: External: 2 steps; on left going up Has following equipment at home: Single point cane, Walker - 2 wheeled,  shower chair, and Grab bars  PLOF: Independent- Retired from [employer]  PATIENT GOALS: Walk  without any assistive device.  OBJECTIVE:  Note: Objective measures were completed at Evaluation unless otherwise noted.  DIAGNOSTIC FINDINGS: MRI BRAIN WITHOUT CONTRAST  INDICATION: Neuro deficit, acute, stroke suspected, left sided facial weakness, I63.9 Cerebral infarction, unspecified (CMS/HHS-HCC)  COMPARISON: December 02, 2023 CTA head and neck  TECHNIQUE/PROTOCOL: Standard adult protocol brain MR without contrast.  FINDINGS: Brain Parenchyma: Multifocal areas of restricted diffusion within the right cerebral hemisphere; for example, within the right periventricular white matter (series 6 image 47), right posterior temporal lobe (series 6 image 44), inferior right temporal lobe (series 6 image 41), and right parietal lobe (series 6 image 50). No midline shift. Susceptibility artifact from left frontal approach ventriculostomy shunt catheter obscures a majority of the left hemisphere and susceptibility artifact from right ICA terminus coils portions of the right temporal lobe. Encephalomalacia around the coil pack and left frontal parietal lobe, unchanged. Area of FLAIR hyperintensity in the right cerebellar peduncle, likely representing wallerian degeneration. Scattered areas of increased T2/FLAIR signal intensity in the periventricular white matter, non-specific but likely the sequela of chronic small vessel ischemic disease. Ventricles and Sulci: Stable appearance of shunted ventricular system with left frontal approach ventriculostomy catheter with tip terminating near the septum pellucidum. Extra-Axial Spaces: No extra-axial fluid collection. Basal Cisterns: Normal. Intracranial Flow-Voids: Normal.  Paranasal Sinuses: Non-opacified. Mastoids: Normal. Orbits: Normal. Cranium: Multiple burr holes. Visualized upper cervical spine: No high grade stenosis.   IMPRESSION: 1. Multifocal acute infarcts within the right MCA territory, as above. Distribution may reflect  thromboembolic etiology from the more proximal right MCA. 2. Please note that considerable susceptibility artifact from left frontal approach ventriculostomy shunt catheter obscures a majority of the left hemisphere limiting evaluation and precluding assessment for infarct in these locations.  Findings were discussed communicated via Epic secure chat with Devora MD by Lang Payment, MD at 12/03/2023 12:50 AM.  The preliminary report (critical or emergent communication) was reviewed prior to this dictation and there are no substantial differences between the preliminary results and the impressions in this final report.  Electronically Reviewed by:  Metta Fruits, MD, [facility] Electronically Reviewed on:  12/03/2023 11:11 AM  I have reviewed the images and concur with the above findings.  Electronically Signed by:  Evalene Bigger, MD, [facility] Electronically Signed on:  12/03/2023 5:30 PM Procedure Note  Amrhein, Evalene Agent, MD - 12/03/2023 Formatting of this note might be different from the original. MRI BRAIN WITHOUT CONTRAST  INDICATION: Neuro deficit, acute, stroke suspected, left sided facial weakness, I63.9 Cerebral infarction, unspecified (CMS/HHS-HCC)  COMPARISON: December 02, 2023 CTA head and neck  TECHNIQUE/PROTOCOL: Standard adult protocol brain MR without contrast.  FINDINGS: Brain Parenchyma: Multifocal areas of restricted diffusion within the right cerebral hemisphere; for example, within the right periventricular white matter (series 6 image 47), right posterior temporal lobe (series 6 image 44), inferior right temporal lobe (series 6 image 41), and right parietal lobe (series 6 image 50). No midline shift. Susceptibility artifact from left frontal approach ventriculostomy shunt catheter obscures a majority of the left hemisphere and susceptibility artifact from right ICA terminus coils portions of the right temporal lobe. Encephalomalacia around the  coil pack and left frontal parietal lobe, unchanged. Area of FLAIR hyperintensity in the right cerebellar peduncle, likely representing wallerian degeneration. Scattered areas of increased T2/FLAIR signal intensity in the periventricular white matter, non-specific but likely the sequela of chronic small vessel ischemic disease.  Ventricles and Sulci: Stable appearance of shunted ventricular system with left frontal approach ventriculostomy catheter with tip terminating near the septum pellucidum. Extra-Axial Spaces: No extra-axial fluid collection. Basal Cisterns: Normal. Intracranial Flow-Voids: Normal.  Paranasal Sinuses: Non-opacified. Mastoids: Normal. Orbits: Normal. Cranium: Multiple burr holes. Visualized upper cervical spine: No high grade stenosis.   IMPRESSION: 1. Multifocal acute infarcts within the right MCA territory, as above. Distribution may reflect thromboembolic etiology from the more proximal right MCA. 2. Please note that considerable susceptibility artifact from left frontal approach ventriculostomy shunt catheter obscures a majority of the left hemisphere limiting evaluation and precluding assessment for infarct in these locations.  Findings were discussed communicated via Epic secure chat with Devora MD by Lang Payment, MD at 12/03/2023 12:50 AM.  The preliminary report (critical or emergent communication) was reviewed prior to this dictation and there are no substantial differences between the preliminary results and the impressions in this final report.  Electronically Reviewed by:  Metta Fruits, MD, [facility] Electronically Reviewed on:  12/03/2023 11:11 AM  I have reviewed the images and concur with the above findings.  Electronically Signed by:  Evalene Bigger, MD, [facility] Electronically Signed on:  12/03/2023 5:30 PM Exam End: 12/03/23 00:10   Specimen Collected: 12/02/23 Last Resulted: 12/03/23 17:30  Received From: [facility]  Result Received: 12/09/23 16:07     COGNITION: Overall cognitive status: Within functional limits for tasks assessed   SENSATION: WFL  COORDINATION: Impaired - delayed each LE  EDEMA:  None observed   POSTURE: rounded shoulders and forward head  LOWER EXTREMITY ROM:     Active  Right Eval Left Eval  Hip flexion    Hip extension    Hip abduction    Hip adduction    Hip internal rotation    Hip external rotation    Knee flexion    Knee extension    Ankle dorsiflexion    Ankle plantarflexion    Ankle inversion    Ankle eversion     (Blank rows = not tested)  LOWER EXTREMITY MMT:    MMT Right Eval Left Eval  Hip flexion 4 4  Hip extension    Hip abduction    Hip adduction    Hip internal rotation 2- 2-  Hip external rotation 3+ 3+  Knee flexion 3+ 3+  Knee extension 4 4  Ankle dorsiflexion 4 4  Ankle plantarflexion    Ankle inversion    Ankle eversion    (Blank rows = not tested)  BED MOBILITY:  Not tested  TRANSFERS: Sit to stand: CGA  Assistive device utilized: Environmental consultant - 2 wheeled     Stand to sit: CGA  Assistive device utilized: Environmental consultant - 2 wheeled      RAMP:  Not tested  CURB:  Not tested  STAIRS: Not tested GAIT: Findings: Gait Characteristics: step to pattern, decreased arm swing- Right, decreased arm swing- Left, decreased step length- Right, decreased step length- Left, decreased stance time- Right, decreased stance time- Left, decreased stride length, decreased hip/knee flexion- Right, decreased hip/knee flexion- Left, decreased ankle dorsiflexion- Right, decreased ankle dorsiflexion- Left, wide BOS, poor foot clearance- Right, and poor foot clearance- Left, Distance walked: 60 feet, Assistive device utilized:Walker - 2 wheeled, Level of assistance: CGA, and Comments:    FUNCTIONAL TESTS:  5 times sit to stand: 25.20 sec with LUE Support Timed up and go (TUG): 40 sec with RW (increased time to place right hand on walker)  6  minute walk test: To be assessed next visit 10 meter walk test: to be assessed next visit Berg Balance Scale: To be assessed next visit    PATIENT SURVEYS:  Stroke Impact Scale - To be assessed next visit                                                                                                                             TREATMENT DATE:  12/10/2023 BP= 120/80 mmHg RUE PT EVALUATION only   PATIENT EDUCATION: Education details: PT plan of care; Discussion of symptoms of CVA and to go to ED as soon as possible. Purpose of PT for balance, strength, coordination and endurance.  Person educated: Patient Education method: Explanation Education comprehension: verbalized understanding  HOME EXERCISE PROGRAM: To be initiated next 1-2 visits.  GOALS: Goals reviewed with patient? Yes  SHORT TERM GOALS: Target date: 01/21/2024  Pt will be independent with HEP in order to improve strength and balance in order to decrease fall risk and improve function at home.  Baseline: EVAL - No current formal HEP in place Goal status: INITIAL   LONG TERM GOALS: Target date: 03/03/2024  1.  Patient will complete five times sit to stand test in < 15 seconds indicating an increased LE strength and improved balance. Baseline: EVAL= 25.20 sec with LUE Support Goal status: INITIAL   2.  Patient will increase Berg Balance score by > 6 points to demonstrate decreased fall risk during functional activities. Baseline: EVAL: To be assessed next visit Goal status: INITIAL   3.  Patient will reduce timed up and go to <11 seconds to reduce fall risk and demonstrate improved transfer/gait ability. Baseline: EVAL = 40 sec with RW Goal status: INITIAL  4.  Patient will increase 10 meter walk test to >1.71m/s as to improve gait speed for better community ambulation and to reduce fall risk. Baseline: EVAL: To be assessed next visit Goal status: INITIAL  5. Patient will increase six minute walk test distance  by 200 feet for progression to community ambulator and improve gait ability Baseline: EVAL= To be assessed next visit Goal status: INITIAL   ASSESSMENT:  CLINICAL IMPRESSION: Patient is a 68 y.o. female who was seen today for physical therapy evaluation and treatment for R CVA with left sided weakness. She presents today with impaired mobility- reliant currently on RW which she has some difficulty grasping with RUE due to previous CVA. She has some left sided weakness from most current CVA and difficulty with transfers and mobility. Will need to further assess gait speed, endurance and balance next visit as well as set up Home program. She will benefit from skilled PT to address her overall LE muscle weakness, impaired functional mobility and balance to improve her overall functional independence, quality of life, safety with mobility and decreased risk of falling.   OBJECTIVE IMPAIRMENTS: Abnormal gait, decreased activity tolerance, decreased balance, decreased coordination, decreased endurance, decreased mobility, difficulty walking, decreased ROM, decreased  strength, impaired UE functional use, and pain.   ACTIVITY LIMITATIONS: carrying, lifting, bending, sitting, standing, squatting, sleeping, stairs, and transfers  PARTICIPATION LIMITATIONS: meal prep, cleaning, laundry, driving, shopping, community activity, and yard work  PERSONAL FACTORS: 1-2 comorbidities: OA, previous CVA with significant R sided weakness are also affecting patient's functional outcome.   REHAB POTENTIAL: Good  CLINICAL DECISION MAKING: Stable/uncomplicated  EVALUATION COMPLEXITY: Moderate  PLAN:  PT FREQUENCY: 1-2x/week  PT DURATION: 12 weeks  PLANNED INTERVENTIONS: 97164- PT Re-evaluation, 97750- Physical Performance Testing, 97110-Therapeutic exercises, 97530- Therapeutic activity, [id number]- Neuromuscular re-education, 97535- Self Care, [id number]- Manual therapy, [id number]- Gait training, [phone]- Orthotic Initial,  [phone]- Orthotic/Prosthetic subsequent, [phone]- Canalith repositioning, [phone]- Electrical stimulation (manual), [phone] (1-2 muscles), [phone] (3+ muscles)- Dry Needling, Patient/Family education, Balance training, Stair training, Taping, Joint mobilization, Joint manipulation, Spinal manipulation, Spinal mobilization, Compression bandaging, Vestibular training, DME instructions, Cryotherapy, and Moist heat  PLAN FOR NEXT SESSION:  - Continue to assess Balance- BERG -6 min walk test -10 meter walk test - SIS- 16 -Initiate LE strengthening, transfer safety, gait training with appropriate AD -Implement appropriate HEP    Reyes LOISE London, PT 12/11/2023, 4:26 PM

## 2023-12-12 ENCOUNTER — Ambulatory Visit

## 2023-12-12 DIAGNOSIS — R2689 Other abnormalities of gait and mobility: Secondary | ICD-10-CM

## 2023-12-12 DIAGNOSIS — R262 Difficulty in walking, not elsewhere classified: Secondary | ICD-10-CM

## 2023-12-12 DIAGNOSIS — R278 Other lack of coordination: Secondary | ICD-10-CM

## 2023-12-12 DIAGNOSIS — R269 Unspecified abnormalities of gait and mobility: Secondary | ICD-10-CM

## 2023-12-12 DIAGNOSIS — M6281 Muscle weakness (generalized): Secondary | ICD-10-CM

## 2023-12-12 DIAGNOSIS — R296 Repeated falls: Secondary | ICD-10-CM

## 2023-12-12 NOTE — Therapy (Signed)
 OUTPATIENT PHYSICAL THERAPY NEURO TREATMENT   Patient Name: Erika Stanton MRN: [id number] DOB:Jun 26, 1955, 68 y.o., female Today's Date: 12/12/2023   PCP: Dr. Oneil Galloway  REFERRING PROVIDER: Dr. Oneil Galloway  END OF SESSION:  PT End of Session - 12/12/23 1325     Visit Number 2    Number of Visits 24    Date for PT Re-Evaluation 03/03/24    PT Start Time 1315    PT Stop Time 1359    PT Time Calculation (min) 44 min    Equipment Utilized During Treatment Gait belt    Activity Tolerance Patient tolerated treatment well    Behavior During Therapy WFL for tasks assessed/performed           Past Medical History:  Diagnosis Date   Anatomical narrow angle, bilateral    Aneurysm (HCC)    Asthma    Cerebral aneurysm    Cerebral vasospasm    Cognitive change    Dysphagia, post-stroke    GERD (gastroesophageal reflux disease)    Hemorrhoids    History of cervical dysplasia    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 no recurrent polyps.  suggest repeat surveillance study 11/2022.     COLONOSCOPY W/ POLYPECTOMY  06/2014   Dr Elder at [facility].  3 adenomatoous polyps, anal fissure.     ESOPHAGOGASTRODUODENOSCOPY (EGD) WITH PROPOFOL  N/A 01/27/2020   Procedure: ESOPHAGOGASTRODUODENOSCOPY (EGD) WITH PROPOFOL ;  Surgeon: Shila Gustav GAILS, MD;  Location: [facility] ENDOSCOPY;  Service: Endoscopy;  Laterality: N/A;   INSERTION OF PERMANENT INTRAPERITONEAL CANNULA/CATH N/A    LAPAROSCOPIC   IR 3D INDEPENDENT WKST  01/06/2020   IR ANGIO INTRA EXTRACRAN SEL  INTERNAL CAROTID BILAT MOD SED  01/06/2020   IR ANGIO VERTEBRAL SEL VERTEBRAL UNI L MOD SED  01/06/2020   IR ANGIOGRAM FOLLOW UP STUDY  01/06/2020   IR ANGIOGRAM FOLLOW UP STUDY  01/06/2020   IR ANGIOGRAM FOLLOW UP STUDY  01/06/2020   IR ANGIOGRAM FOLLOW UP STUDY  01/06/2020   IR ANGIOGRAM FOLLOW UP STUDY  01/06/2020   IR ANGIOGRAM FOLLOW UP STUDY  01/06/2020   IR ANGIOGRAM FOLLOW UP STUDY  01/06/2020   IR ANGIOGRAM FOLLOW UP STUDY  01/06/2020   IR ANGIOGRAM FOLLOW UP STUDY  01/06/2020   IR ANGIOGRAM FOLLOW UP STUDY  01/06/2020   IR NEURO EACH ADD'L AFTER BASIC UNI RIGHT (MS)  01/06/2020   IR TRANSCATH/EMBOLIZ  01/06/2020   MASTECTOMY     MOHS SURGERY N/A    RADIOLOGY WITH ANESTHESIA N/A 01/06/2020   Procedure: IR WITH ANESTHESIA FOR ANEURYSM;  Surgeon: Lanis Pupa, MD;  Location: [facility] OR;  Service: Radiology;  Laterality: N/A;   STENT SUPPORTED EMBOLIZATION OF RIGHT ICA ANEURYSM Right    TUBAL LIGATION     VENTRICULO-PERITONEAL SHUNT PLACEMENT / LAPAROSCOPIC INSERTION PERITONEAL CATHETER N/A    Patient Active Problem List   Diagnosis Date Noted   Dyspareunia in female 04/05/2020   History of abnormal cervical  Pap smear 03/10/2020   GIB (gastrointestinal bleeding) 02/28/2020   Elevated BUN    Prediabetes    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   Cerebral vasospasm    Sinus tachycardia    Dysphagia, post-stroke    Thrombocytopenia (HCC)    Acute blood loss anemia    Brain aneurysm    ICH (intracerebral hemorrhage) (HCC) 01/05/2020   History of adenomatous polyp of colon 02/07/2016   Anatomical narrow angle, bilateral 12/14/2014   Fissure in ano 11/11/2014   Hemorrhoids 11/11/2014   Constipation 11/19/2013   GERD (gastroesophageal reflux disease) 03/24/2013   Irritable bowel syndrome with diarrhea 03/24/2013   Herpes zoster 05/14/2005   Abnormal Pap smear of cervix 05/15/1983   History of cervical dysplasia 05/15/1983    ONSET DATE: 12/02/2023  REFERRING  DIAG: I63.9 (ICD-10-CM) - CVA (cerebral vascular accident) (HCC)   THERAPY DIAG:  Muscle weakness (generalized)  Other lack of coordination  Abnormality of gait and mobility  Difficulty in walking, not elsewhere classified  Other abnormalities of gait and mobility  Repeated falls  Rationale for Evaluation and Treatment: Rehabilitation  SUBJECTIVE:                                                                                                                                                                                             SUBJECTIVE STATEMENT:  From Today: Just to be clear- I was using the cane until just before my CVA- I got a walker to prepare for upcoming TKR surgery but then after the CVA I was told to use the walker.    From EVAL:Patient reports she was in hospital from [facility]- Sat (July 21st- 25th) with CVA affecting left side. I was supposed to have a knee replacement surgery in September but that is on hold for now. I was doing my prep for my colonoscopy and fell in the bathroom.  Patient reports having mild left sided weakness compared to her CVA in 2021 with heavy Right sided UE weakness. She reports bad OA affecting both knees as well.    Pt accompanied by: Husband brought patient  PERTINENT HISTORY: Previous CVA in 2021 affecting Right side. Reports going to ED  last Monday due to fall/CVA affecting Left side. Diagnosed with R CVA with Left Sided weakness.    PAIN:  Are you having pain? Yes: NPRS scale: Current = 1/10; worst= 6/10 Pain location: Bilateral knees- Arthritis  Pain description: achy yet sharp with weight bearing Aggravating factors: Increased walking, transfers Relieving factors: rest  PRECAUTIONS: Fall  RED FLAGS: None   WEIGHT BEARING RESTRICTIONS: No  FALLS: Has patient fallen in  last 6 months? Yes. Number of falls fell with the stroke  LIVING ENVIRONMENT: Lives with: lives with their spouse Lives in: House/apartment Stairs: Yes:  External: 2 steps; on left going up Has following equipment at home: Single point cane, Walker - 2 wheeled, shower chair, and Grab bars  PLOF: Independent- Retired from [employer]  PATIENT GOALS: Walk without any assistive device.  OBJECTIVE:  Note: Objective measures were completed at Evaluation unless otherwise noted.  DIAGNOSTIC FINDINGS: MRI BRAIN WITHOUT CONTRAST  INDICATION: Neuro deficit, acute, stroke suspected, left sided facial weakness, I63.9 Cerebral infarction, unspecified (CMS/HHS-HCC)  COMPARISON: December 02, 2023 CTA head and neck  TECHNIQUE/PROTOCOL: Standard adult protocol brain MR without contrast.  FINDINGS: Brain Parenchyma: Multifocal areas of restricted diffusion within the right cerebral hemisphere; for example, within the right periventricular white matter (series 6 image 47), right posterior temporal lobe (series 6 image 44), inferior right temporal lobe (series 6 image 41), and right parietal lobe (series 6 image 50). No midline shift. Susceptibility artifact from left frontal approach ventriculostomy shunt catheter obscures a majority of the left hemisphere and susceptibility artifact from right ICA terminus coils portions of the right temporal lobe. Encephalomalacia around the coil pack and left frontal parietal lobe, unchanged. Area of FLAIR hyperintensity in the right cerebellar peduncle, likely representing wallerian degeneration. Scattered areas of increased T2/FLAIR signal intensity in the periventricular white matter, non-specific but likely the sequela of chronic small vessel ischemic disease. Ventricles and Sulci: Stable appearance of shunted ventricular system with left frontal approach ventriculostomy catheter with tip terminating near the septum pellucidum. Extra-Axial Spaces: No extra-axial fluid collection. Basal Cisterns: Normal. Intracranial Flow-Voids: Normal.  Paranasal Sinuses: Non-opacified. Mastoids: Normal. Orbits: Normal. Cranium:  Multiple burr holes. Visualized upper cervical spine: No high grade stenosis.   IMPRESSION: 1. Multifocal acute infarcts within the right MCA territory, as above. Distribution may reflect thromboembolic etiology from the more proximal right MCA. 2. Please note that considerable susceptibility artifact from left frontal approach ventriculostomy shunt catheter obscures a majority of the left hemisphere limiting evaluation and precluding assessment for infarct in these locations.  Findings were discussed communicated via Epic secure chat with Devora MD by Lang Payment, MD at 12/03/2023 12:50 AM.  The preliminary report (critical or emergent communication) was reviewed prior to this dictation and there are no substantial differences between the preliminary results and the impressions in this final report.  Electronically Reviewed by:  Metta Fruits, MD, [facility] Electronically Reviewed on:  12/03/2023 11:11 AM  I have reviewed the images and concur with the above findings.  Electronically Signed by:  Evalene Bigger, MD, [facility] Electronically Signed on:  12/03/2023 5:30 PM Procedure Note  Amrhein, Evalene Agent, MD - 12/03/2023 Formatting of this note might be different from the original. MRI BRAIN WITHOUT CONTRAST  INDICATION: Neuro deficit, acute, stroke suspected, left sided facial weakness, I63.9 Cerebral infarction, unspecified (CMS/HHS-HCC)  COMPARISON: December 02, 2023 CTA head and neck  TECHNIQUE/PROTOCOL: Standard adult protocol brain MR without contrast.  FINDINGS: Brain Parenchyma: Multifocal areas of restricted diffusion within the right cerebral hemisphere; for example, within the right periventricular white matter (series 6 image 47), right posterior temporal lobe (series 6 image 44), inferior right temporal lobe (series 6 image 41), and right parietal lobe (series 6 image 50). No midline shift. Susceptibility artifact from left frontal approach  ventriculostomy shunt catheter obscures a majority of the left hemisphere and susceptibility artifact from right ICA terminus coils portions of the right temporal lobe. Encephalomalacia around the  coil pack and left frontal parietal lobe, unchanged. Area of FLAIR hyperintensity in the right cerebellar peduncle, likely representing wallerian degeneration. Scattered areas of increased T2/FLAIR signal intensity in the periventricular white matter, non-specific but likely the sequela of chronic small vessel ischemic disease. Ventricles and Sulci: Stable appearance of shunted ventricular system with left frontal approach ventriculostomy catheter with tip terminating near the septum pellucidum. Extra-Axial Spaces: No extra-axial fluid collection. Basal Cisterns: Normal. Intracranial Flow-Voids: Normal.  Paranasal Sinuses: Non-opacified. Mastoids: Normal. Orbits: Normal. Cranium: Multiple burr holes. Visualized upper cervical spine: No high grade stenosis.   IMPRESSION: 1. Multifocal acute infarcts within the right MCA territory, as above. Distribution may reflect thromboembolic etiology from the more proximal right MCA. 2. Please note that considerable susceptibility artifact from left frontal approach ventriculostomy shunt catheter obscures a majority of the left hemisphere limiting evaluation and precluding assessment for infarct in these locations.  Findings were discussed communicated via Epic secure chat with Devora MD by Lang Payment, MD at 12/03/2023 12:50 AM.  The preliminary report (critical or emergent communication) was reviewed prior to this dictation and there are no substantial differences between the preliminary results and the impressions in this final report.  Electronically Reviewed by:  Metta Fruits, MD, [facility] Electronically Reviewed on:  12/03/2023 11:11 AM  I have reviewed the images and concur with the above findings.  Electronically Signed by:  Evalene Bigger, MD, [facility] Electronically Signed on:  12/03/2023 5:30 PM Exam End: 12/03/23 00:10   Specimen Collected: 12/02/23 Last Resulted: 12/03/23 17:30  Received From: [facility]  Result Received: 12/09/23 16:07     COGNITION: Overall cognitive status: Within functional limits for tasks assessed   SENSATION: WFL  COORDINATION: Impaired - delayed each LE  EDEMA:  None observed   POSTURE: rounded shoulders and forward head  LOWER EXTREMITY ROM:     Active  Right Eval Left Eval  Hip flexion    Hip extension    Hip abduction    Hip adduction    Hip internal rotation    Hip external rotation    Knee flexion    Knee extension    Ankle dorsiflexion    Ankle plantarflexion    Ankle inversion    Ankle eversion     (Blank rows = not tested)  LOWER EXTREMITY MMT:    MMT Right Eval Left Eval  Hip flexion 4 4  Hip extension    Hip abduction    Hip adduction    Hip internal rotation 2- 2-  Hip external rotation 3+ 3+  Knee flexion 3+ 3+  Knee extension 4 4  Ankle dorsiflexion 4 4  Ankle plantarflexion    Ankle inversion    Ankle eversion    (Blank rows = not tested)  BED MOBILITY:  Not tested  TRANSFERS: Sit to stand: CGA  Assistive device utilized: Environmental consultant - 2 wheeled     Stand to sit: CGA  Assistive device utilized: Environmental consultant - 2 wheeled      RAMP:  Not tested  CURB:  Not tested  STAIRS: Not tested GAIT: Findings: Gait Characteristics: step to pattern, decreased arm swing- Right, decreased arm swing- Left, decreased step length- Right, decreased step length- Left, decreased stance time- Right, decreased stance time- Left, decreased stride length, decreased hip/knee flexion- Right, decreased hip/knee flexion- Left, decreased ankle dorsiflexion- Right, decreased ankle dorsiflexion- Left, wide BOS, poor foot clearance- Right, and poor foot clearance- Left, Distance walked: 60 feet, Assistive device utilized:Walker -  2 wheeled, Level  of assistance: CGA, and Comments:    FUNCTIONAL TESTS:  5 times sit to stand: 25.20 sec with LUE Support Timed up and go (TUG): 40 sec with RW (increased time to place right hand on walker)  6 minute walk test: To be assessed next visit 10 meter walk test: to be assessed next visit Berg Balance Scale: To be assessed next visit    PATIENT SURVEYS:  Stroke Impact Scale - To be assessed next visit                                                                                                                             TREATMENT DATE:   Item Test date: 12/12/2023 Test date:  Test date:   Sitting to standing 3. able to stand independently using hands Insert OPRCBERGREEVAL SmartPhrase at re-test date Insert OPRCBERGREEVAL SmartPhrase at re-test date  2. Standing unsupported 4. able to stand safely for 2 minutes    3. Sitting with back unsupported, feet supported 4. able to sit safely and securely for 2 minutes    4. Standing to sitting 3. controls descent by using hands    5. Pivot transfer  3. able to transfer safely with definite need of hands    6. Standing unsupported with eyes closed 4. able to stand 10 seconds safely    7. Standing unsupported with feet together 3. able to place feet together independently and stand 1 minute with supervision    8. Reaching forward with outstretched arms while standing 3. can reach forward 12 cm (5 inches)    9. Pick up object from the floor from standing 4. able to pick up slipper safely and easily    10. Turning to look behind over left and right shoulders while standing 4. looks behind from both sides and weight shifts well    11. Turn 360 degrees 2. able to turn 360 degrees safely but slowly    12. Place alternate foot on step or stool while standing unsupported 1. able to complete > 2 steps needs minimal assist    13. Standing unsupported one foot in front 3. able to place foot ahead independently and hold 30 seconds    14. Standing on one leg 1.  tries to lift leg unable to hold 3 seconds but remains standing independently.      Total Score 42/56 Total Score /56 Total Score /56    10 Meter Walk Test: Patient instructed to walk 10 meters (32.8 ft) as quickly and as safely as possible at their normal speed x2 and at a fast speed x2. Time measured from 2 meter mark to 8 meter mark to accommodate ramp-up and ramp-down.  Normal speed 1: 0.65 m/s Normal speed 2: 0.65  m/s Average Normal speed: 0.65 m/s with cane  Cut off scores: <0.4 m/s = household Ambulator, 0.4-0.8 m/s = limited community Ambulator, >0.8 m/s = community Ambulator, >1.2 m/s = crossing a  street, <1.0 = increased fall risk MCID 0.05 m/s (small), 0.13 m/s (moderate), 0.06 m/s (significant)  (ANPTA Core Set of Outcome Measures for Adults with Neurologic Conditions, 2018)   PATIENT EDUCATION: Education details: PT plan of care; Discussion of symptoms of CVA and to go to ED as soon as possible. Purpose of PT for balance, strength, coordination and endurance.  Person educated: Patient Education method: Explanation Education comprehension: verbalized understanding  HOME EXERCISE PROGRAM: To be initiated next 1-2 visits.  GOALS: Goals reviewed with patient? Yes  SHORT TERM GOALS: Target date: 01/21/2024  Pt will be independent with HEP in order to improve strength and balance in order to decrease fall risk and improve function at home.  Baseline: EVAL - No current formal HEP in place Goal status: INITIAL   LONG TERM GOALS: Target date: 03/03/2024  1.  Patient will complete five times sit to stand test in < 15 seconds indicating an increased LE strength and improved balance. Baseline: EVAL= 25.20 sec with LUE Support Goal status: INITIAL   2.  Patient will increase Berg Balance score by > 6 points to demonstrate decreased fall risk during functional activities. Baseline: EVAL: To be assessed next visit; 12/12/2023=  Goal status: INITIAL   3.  Patient will reduce  timed up and go to <11 seconds to reduce fall risk and demonstrate improved transfer/gait ability. Baseline: EVAL = 40 sec with RW Goal status: INITIAL  4.  Patient will increase 10 meter walk test to >1.74m/s as to improve gait speed for better community ambulation and to reduce fall risk. Baseline: EVAL: To be assessed next visit; 0.65 m/s Goal status: INITIAL  5. Patient will increase six minute walk test distance by 200 feet for progression to community ambulator and improve gait ability Baseline: EVAL= To be assessed next visit Goal status: INITIAL   ASSESSMENT:  CLINICAL IMPRESSION: Patient is a 68 y.o. female who was seen today for physical therapy treatment for R CVA with left sided weakness. Treatment continued with further assessment to address balance and functional mobility. Patient demonstrates impairments - Scoring 42/56 on BERG- with increased risk of falling. She also presents with decreased overall gait speed with use of cane today. Increased time required for BERG due to needing rest breaks secondary to fatigue. She will benefit from skilled PT to address her overall LE muscle weakness, impaired functional mobility and balance to improve her overall functional independence, quality of life, safety with mobility and decreased risk of falling.   OBJECTIVE IMPAIRMENTS: Abnormal gait, decreased activity tolerance, decreased balance, decreased coordination, decreased endurance, decreased mobility, difficulty walking, decreased ROM, decreased strength, impaired UE functional use, and pain.   ACTIVITY LIMITATIONS: carrying, lifting, bending, sitting, standing, squatting, sleeping, stairs, and transfers  PARTICIPATION LIMITATIONS: meal prep, cleaning, laundry, driving, shopping, community activity, and yard work  PERSONAL FACTORS: 1-2 comorbidities: OA, previous CVA with significant R sided weakness are also affecting patient's functional outcome.   REHAB POTENTIAL: Good  CLINICAL  DECISION MAKING: Stable/uncomplicated  EVALUATION COMPLEXITY: Moderate  PLAN:  PT FREQUENCY: 1-2x/week  PT DURATION: 12 weeks  PLANNED INTERVENTIONS: 97164- PT Re-evaluation, 97750- Physical Performance Testing, 97110-Therapeutic exercises, 97530- Therapeutic activity, [id number]- Neuromuscular re-education, 97535- Self Care, [id number]- Manual therapy, [id number]- Gait training, [phone]- Orthotic Initial, [phone]- Orthotic/Prosthetic subsequent, [phone]- Canalith repositioning, [phone]- Electrical stimulation (manual), [phone] (1-2 muscles), [phone] (3+ muscles)- Dry Needling, Patient/Family education, Balance training, Stair training, Taping, Joint mobilization, Joint manipulation, Spinal manipulation, Spinal mobilization, Compression bandaging, Vestibular training, DME instructions, Cryotherapy, and  Moist heat  PLAN FOR NEXT SESSION:   -6 min walk test - SIS- 16 -Initiate LE strengthening, transfer safety, gait training with appropriate AD -Implement appropriate HEP    Reyes LOISE London, PT 12/12/2023, 2:53 PM

## 2023-12-13 ENCOUNTER — Ambulatory Visit: Attending: Family Medicine

## 2023-12-13 DIAGNOSIS — R269 Unspecified abnormalities of gait and mobility: Secondary | ICD-10-CM | POA: Diagnosis present

## 2023-12-13 DIAGNOSIS — R262 Difficulty in walking, not elsewhere classified: Secondary | ICD-10-CM | POA: Diagnosis present

## 2023-12-13 DIAGNOSIS — M6281 Muscle weakness (generalized): Secondary | ICD-10-CM | POA: Diagnosis present

## 2023-12-13 DIAGNOSIS — R296 Repeated falls: Secondary | ICD-10-CM | POA: Diagnosis present

## 2023-12-13 DIAGNOSIS — R278 Other lack of coordination: Secondary | ICD-10-CM | POA: Insufficient documentation

## 2023-12-13 DIAGNOSIS — R2689 Other abnormalities of gait and mobility: Secondary | ICD-10-CM | POA: Insufficient documentation

## 2023-12-16 NOTE — Therapy (Signed)
 OUTPATIENT PHYSICAL THERAPY NEURO TREATMENT   Patient Name: Erika Stanton MRN: [id number] DOB:10-Sep-1955, 68 y.o., female Today's Date: 12/19/2023   PCP: Dr. Oneil Galloway  REFERRING PROVIDER: Dr. 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 no recurrent polyps.  suggest repeat surveillance study 11/2022.     COLONOSCOPY W/ POLYPECTOMY  06/2014   Dr Elder at [facility].  3 adenomatoous polyps, anal fissure.     ESOPHAGOGASTRODUODENOSCOPY (EGD) WITH PROPOFOL  N/A 01/27/2020   Procedure: ESOPHAGOGASTRODUODENOSCOPY (EGD) WITH PROPOFOL ;  Surgeon: Shila Gustav GAILS, MD;  Location: [facility] END[facility]COPY;  Service: Endoscopy;  Laterality: N/A;   INSERTION OF PERMANENT INTRAPERITONEAL CANNULA/CATH N/A    LAPAROSCOPIC   IR 3D INDEPENDENT WKST  01/06/2020   IR ANGIO INTRA EXTRACRAN SEL INTERNAL CAROTID BILAT MOD SED  01/06/2020   IR ANGIO VERTEBRAL SEL VERTEBRAL UNI L MOD SED  01/06/2020   IR ANGIOGRAM FOLLOW UP STUDY  01/06/2020   IR ANGIOGRAM FOLLOW UP STUDY  01/06/2020   IR ANGIOGRAM FOLLOW UP STUDY  01/06/2020   IR ANGIOGRAM FOLLOW UP STUDY  01/06/2020   IR ANGIOGRAM FOLLOW UP STUDY  01/06/2020   IR ANGIOGRAM FOLLOW UP STUDY  01/06/2020   IR  ANGIOGRAM FOLLOW UP STUDY  01/06/2020   IR ANGIOGRAM FOLLOW UP STUDY  01/06/2020   IR ANGIOGRAM FOLLOW UP STUDY  01/06/2020   IR ANGIOGRAM FOLLOW UP STUDY  01/06/2020   IR NEURO EACH ADD'L AFTER BASIC UNI RIGHT (MS)  01/06/2020   IR TRANSCATH/EMBOLIZ  01/06/2020   MASTECTOMY     MOHS SURGERY N/A    RADIOLOGY WITH ANESTHESIA N/A 01/06/2020   Procedure: IR WITH ANESTHESIA FOR ANEURYSM;  Surgeon: Lanis Pupa, MD;  Location: [facility] OR;  Service: Radiology;  Laterality: N/A;   STENT SUPPORTED EMBOLIZATION OF RIGHT ICA ANEURYSM Right    TUBAL LIGATION     VENTRICULO-PERITONEAL SHUNT PLACEMENT / LAPAROSCOPIC INSERTION PERITONEAL CATHETER N/A    Patient Active Problem List   Diagnosis Date Noted   Dyspareunia in female 04/05/2020   History of abnormal cervical Pap smear 03/10/2020   GIB (gastrointestinal bleeding) 02/28/2020   Elevated BUN    Prediabetes    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   Cerebral vasospasm    Sinus tachycardia    Dysphagia, post-stroke    Thrombocytopenia (HCC)    Acute blood loss anemia    Brain aneurysm    ICH (intracerebral hemorrhage) (HCC) 01/05/2020   History of adenomatous polyp of colon 02/07/2016   Anatomical narrow angle, bilateral 12/14/2014   Fissure in ano 11/11/2014  Hemorrhoids 11/11/2014   Constipation 11/19/2013   GERD (gastroesophageal reflux disease) 03/24/2013   Irritable bowel syndrome with diarrhea 03/24/2013   Herpes zoster 05/14/2005   Abnormal Pap smear of cervix 05/15/1983   History of cervical dysplasia 05/15/1983    ONSET DATE: 12/02/2023  REFERRING DIAG: I63.9 (ICD-10-CM) - CVA (cerebral vascular accident) (HCC)   THERAPY DIAG:  Muscle weakness (generalized)  Other lack of coordination  Abnormality of gait and mobility  Difficulty in walking, not elsewhere classified  Other abnormalities of gait and mobility  Repeated falls  Rationale for Evaluation and Treatment: Rehabilitation  SUBJECTIVE:                                                                                                                                                                                              SUBJECTIVE STATEMENT:  Pt reports no new complications and denies any falls since the last visit.  Pt looking forward to getting HEP today.   From EVAL:Patient reports she was in hospital from [facility]- Sat (July 21st- 25th) with CVA affecting left side. I was supposed to have a knee replacement surgery in September but that is on hold for now. I was doing my prep for my colonoscopy and fell in the bathroom.  Patient reports having mild left sided weakness compared to her CVA in 2021 with heavy Right sided UE weakness. She reports bad OA affecting both knees as well.    Pt accompanied by: Husband brought patient  PERTINENT HISTORY: Previous CVA in 2021 affecting Right side. Reports going to ED  last Monday due to fall/CVA affecting Left side. Diagnosed with R CVA with Left Sided weakness.    PAIN:  Are you having pain? Yes: NPRS scale: Current = 1/10; worst= 6/10 Pain location: Bilateral knees- Arthritis  Pain description: achy yet sharp with weight bearing Aggravating factors: Increased walking, transfers Relieving factors: rest  PRECAUTIONS: Fall  RED FLAGS: None   WEIGHT BEARING RESTRICTIONS: No  FALLS: Has patient fallen in last 6 months? Yes. Number of falls fell with the stroke  LIVING ENVIRONMENT: Lives with: lives with their spouse Lives in: House/apartment Stairs: Yes: External: 2 steps; on left going up Has following equipment at home: Single point cane, Walker - 2 wheeled, shower chair, and Grab bars  PLOF: Independent- Retired from [employer]  PATIENT GOALS: Walk without any assistive device.  OBJECTIVE:  Note: Objective measures were completed at Evaluation unless otherwise noted.  DIAGNOSTIC FINDINGS: MRI BRAIN WITHOUT CONTRAST  INDICATION: Neuro deficit, acute, stroke suspected, left sided  facial weakness, I63.9 Cerebral infarction, unspecified (CMS/HHS-HCC)  COMPARISON: December 02, 2023 CTA  head and neck  TECHNIQUE/PROTOCOL: Standard adult protocol brain MR without contrast.  FINDINGS: Brain Parenchyma: Multifocal areas of restricted diffusion within the right cerebral hemisphere; for example, within the right periventricular white matter (series 6 image 47), right posterior temporal lobe (series 6 image 44), inferior right temporal lobe (series 6 image 41), and right parietal lobe (series 6 image 50). No midline shift. Susceptibility artifact from left frontal approach ventriculostomy shunt catheter obscures a majority of the left hemisphere and susceptibility artifact from right ICA terminus coils portions of the right temporal lobe. Encephalomalacia around the coil pack and left frontal parietal lobe, unchanged. Area of FLAIR hyperintensity in the right cerebellar peduncle, likely representing wallerian degeneration. Scattered areas of increased T2/FLAIR signal intensity in the periventricular white matter, non-specific but likely the sequela of chronic small vessel ischemic disease. Ventricles and Sulci: Stable appearance of shunted ventricular system with left frontal approach ventriculostomy catheter with tip terminating near the septum pellucidum. Extra-Axial Spaces: No extra-axial fluid collection. Basal Cisterns: Normal. Intracranial Flow-Voids: Normal.  Paranasal Sinuses: Non-opacified. Mastoids: Normal. Orbits: Normal. Cranium: Multiple burr holes. Visualized upper cervical spine: No high grade stenosis.   IMPRESSION: 1. Multifocal acute infarcts within the right MCA territory, as above. Distribution may reflect thromboembolic etiology from the more proximal right MCA. 2. Please note that considerable susceptibility artifact from left frontal approach ventriculostomy shunt catheter obscures a majority of the left hemisphere limiting evaluation and  precluding assessment for infarct in these locations.  Findings were discussed communicated via Epic secure chat with Devora MD by Lang Payment, MD at 12/03/2023 12:50 AM.  The preliminary report (critical or emergent communication) was reviewed prior to this dictation and there are no substantial differences between the preliminary results and the impressions in this final report.  Electronically Reviewed by:  Metta Fruits, MD, [facility] Electronically Reviewed on:  12/03/2023 11:11 AM  I have reviewed the images and concur with the above findings.  Electronically Signed by:  Evalene Bigger, MD, [facility] Electronically Signed on:  12/03/2023 5:30 PM Procedure Note  Amrhein, Evalene Agent, MD - 12/03/2023 Formatting of this note might be different from the original. MRI BRAIN WITHOUT CONTRAST  INDICATION: Neuro deficit, acute, stroke suspected, left sided facial weakness, I63.9 Cerebral infarction, unspecified (CMS/HHS-HCC)  COMPARISON: December 02, 2023 CTA head and neck  TECHNIQUE/PROTOCOL: Standard adult protocol brain MR without contrast.  FINDINGS: Brain Parenchyma: Multifocal areas of restricted diffusion within the right cerebral hemisphere; for example, within the right periventricular white matter (series 6 image 47), right posterior temporal lobe (series 6 image 44), inferior right temporal lobe (series 6 image 41), and right parietal lobe (series 6 image 50). No midline shift. Susceptibility artifact from left frontal approach ventriculostomy shunt catheter obscures a majority of the left hemisphere and susceptibility artifact from right ICA terminus coils portions of the right temporal lobe. Encephalomalacia around the coil pack and left frontal parietal lobe, unchanged. Area of FLAIR hyperintensity in the right cerebellar peduncle, likely representing wallerian degeneration. Scattered areas of increased T2/FLAIR signal intensity in the periventricular white  matter, non-specific but likely the sequela of chronic small vessel ischemic disease. Ventricles and Sulci: Stable appearance of shunted ventricular system with left frontal approach ventriculostomy catheter with tip terminating near the septum pellucidum. Extra-Axial Spaces: No extra-axial fluid collection. Basal Cisterns: Normal. Intracranial Flow-Voids: Normal.  Paranasal Sinuses: Non-opacified. Mastoids: Normal. Orbits: Normal. Cranium: Multiple burr holes. Visualized upper cervical spine: No high grade stenosis.   IMPRESSION: 1. Multifocal acute infarcts within  the right MCA territory, as above. Distribution may reflect thromboembolic etiology from the more proximal right MCA. 2. Please note that considerable susceptibility artifact from left frontal approach ventriculostomy shunt catheter obscures a majority of the left hemisphere limiting evaluation and precluding assessment for infarct in these locations.  Findings were discussed communicated via Epic secure chat with Devora MD by Lang Payment, MD at 12/03/2023 12:50 AM.  The preliminary report (critical or emergent communication) was reviewed prior to this dictation and there are no substantial differences between the preliminary results and the impressions in this final report.  Electronically Reviewed by:  Metta Fruits, MD, [facility] Electronically Reviewed on:  12/03/2023 11:11 AM  I have reviewed the images and concur with the above findings.  Electronically Signed by:  Evalene Bigger, MD, [facility] Electronically Signed on:  12/03/2023 5:30 PM Exam End: 12/03/23 00:10   Specimen Collected: 12/02/23 Last Resulted: 12/03/23 17:30  Received From: [facility]  Result Received: 12/09/23 16:07     COGNITION: Overall cognitive status: Within functional limits for tasks assessed   SENSATION: WFL  COORDINATION: Impaired - delayed each LE  EDEMA:  None observed   POSTURE: rounded  shoulders and forward head  LOWER EXTREMITY ROM:     Active  Right Eval Left Eval  Hip flexion    Hip extension    Hip abduction    Hip adduction    Hip internal rotation    Hip external rotation    Knee flexion    Knee extension    Ankle dorsiflexion    Ankle plantarflexion    Ankle inversion    Ankle eversion     (Blank rows = not tested)  LOWER EXTREMITY MMT:    MMT Right Eval Left Eval  Hip flexion 4 4  Hip extension    Hip abduction    Hip adduction    Hip internal rotation 2- 2-  Hip external rotation 3+ 3+  Knee flexion 3+ 3+  Knee extension 4 4  Ankle dorsiflexion 4 4  Ankle plantarflexion    Ankle inversion    Ankle eversion    (Blank rows = not tested)  BED MOBILITY:  Not tested  TRANSFERS: Sit to stand: CGA  Assistive device utilized: Environmental consultant - 2 wheeled     Stand to sit: CGA  Assistive device utilized: Environmental consultant - 2 wheeled      RAMP:  Not tested  CURB:  Not tested  STAIRS: Not tested GAIT: Findings: Gait Characteristics: step to pattern, decreased arm swing- Right, decreased arm swing- Left, decreased step length- Right, decreased step length- Left, decreased stance time- Right, decreased stance time- Left, decreased stride length, decreased hip/knee flexion- Right, decreased hip/knee flexion- Left, decreased ankle dorsiflexion- Right, decreased ankle dorsiflexion- Left, wide BOS, poor foot clearance- Right, and poor foot clearance- Left, Distance walked: 60 feet, Assistive device utilized:Walker - 2 wheeled, Level of assistance: CGA, and Comments:    FUNCTIONAL TESTS:  5 times sit to stand: 25.20 sec with LUE Support Timed up and go (TUG): 40 sec with RW (increased time to place right hand on walker)  6 minute walk test: 482' 10 meter walk test: 0.65 m/s Berg Balance Scale: 42/56    PATIENT SURVEYS:  Stroke Impact Scale - 65.63%  TREATMENT DATE: 12/19/23   Physical Performance Test:  6 Min Walk Test:  Instructed patient to ambulate as quickly and as safely as possible for 6 minutes using LRAD. Patient was allowed to take standing rest breaks without stopping the test, but if the patient required a sitting rest break the clock would be stopped and the test would be over.  Results: 482 feet using a cane with L UE. Results indicate that the patient has reduced endurance with ambulation compared to age matched norms.  Age Matched Norms (in meters): 33-69 yo M: 30 F: 104, 82-79 yo M: 33 F: 471, 61-89 yo M: 417 F: 392 MDC: 58.21 meters (190.98 feet) or 50 meters (ANPTA Core Set of Outcome Measures for Adults with Neurologic Conditions, 2018)  Direct time spent with pt with education provided in order to perform test correctly.    TherEx:  Standing heel raise with UE support on bar, 2x10 Standing toe raise with UE support on bar, 2x10 Standing heel raise with UE support on bar, 2x10 Standing mini squats with UE support on bar, 2x10 Standing mini lunge with UE support on bar, 2x10 Standing tandem balance stance with UE support on bar, 2x10    PATIENT EDUCATION: Education details: PT plan of care; Discussion of symptoms of CVA and to go to ED as soon as possible. Purpose of PT for balance, strength, coordination and endurance.  Person educated: Patient Education method: Explanation Education comprehension: verbalized understanding  HOME EXERCISE PROGRAM: Access Code: [email or web address] URL: [email or web address] Date: 12/17/2023 Prepared by: Sidra Simpers  Program Notes **Be sure to perform all exercises next to a countertop or sturdy piece of furniture in case you become unsteady.  Exercises - Standing Heel Raise with Support  - 1 x daily - 7 x weekly - 2 sets - 15 reps - Standing Toe Raises at Chair  - 1 x daily - 7 x weekly - 2 sets - 15 reps - Mini Squat with Counter Support  - 1 x  daily - 7 x weekly - 2 sets - 15 reps - Lunge with Counter Support  - 1 x daily - 7 x weekly - 2 sets - 15 reps - Standing Tandem Balance with Counter Support  - 1 x daily - 7 x weekly - 2 sets - 2 reps - 30 hold - Standing Single Leg Stance with Counter Support  - 1 x daily - 7 x weekly - 2 sets - 2 reps - 30 hold  GOALS: Goals reviewed with patient? Yes  SHORT TERM GOALS: Target date: 01/21/2024  Pt will be independent with HEP in order to improve strength and balance in order to decrease fall risk and improve function at home.  Baseline: EVAL - No current formal HEP in place Goal status: INITIAL   LONG TERM GOALS: Target date: 03/03/2024  1.  Patient will complete five times sit to stand test in < 15 seconds indicating an increased LE strength and improved balance. Baseline: EVAL= 25.20 sec with LUE Support Goal status: INITIAL   2.  Patient will increase Berg Balance score by > 6 points to demonstrate decreased fall risk during functional activities. Baseline: EVAL: To be assessed next visit; 12/12/2023= 42/56 Goal status: INITIAL   3.  Patient will reduce timed up and go to <11 seconds to reduce fall risk and demonstrate improved transfer/gait ability. Baseline: EVAL = 40 sec with RW Goal status: INITIAL  4.  Patient will increase 10 meter walk test to >1.41m/s as  to improve gait speed for better community ambulation and to reduce fall risk. Baseline: EVAL: 0.65 m/s Goal status: INITIAL  5. Patient will increase six minute walk test distance by 200 feet for progression to community ambulator and improve gait ability Baseline: EVAL= 482' Goal status: INITIAL   ASSESSMENT:  CLINICAL IMPRESSION:  Pt responded well to the exercises given and was able to tolerate the newly introduced exercises.  Pt given the HEP packet and was encouraged to perform at minimum once a day.  Pt very verbose throughout the session and at times tangential.  Pt easily redirected, and will continue  with participation o the strengthening exercises.   Pt will continue to benefit from skilled therapy to address remaining deficits in order to improve overall QoL and return to PLOF.     OBJECTIVE IMPAIRMENTS: Abnormal gait, decreased activity tolerance, decreased balance, decreased coordination, decreased endurance, decreased mobility, difficulty walking, decreased ROM, decreased strength, impaired UE functional use, and pain.   ACTIVITY LIMITATIONS: carrying, lifting, bending, sitting, standing, squatting, sleeping, stairs, and transfers  PARTICIPATION LIMITATIONS: meal prep, cleaning, laundry, driving, shopping, community activity, and yard work  PERSONAL FACTORS: 1-2 comorbidities: OA, previous CVA with significant R sided weakness are also affecting patient's functional outcome.   REHAB POTENTIAL: Good  CLINICAL DECISION MAKING: Stable/uncomplicated  EVALUATION COMPLEXITY: Moderate  PLAN:  PT FREQUENCY: 1-2x/week  PT DURATION: 12 weeks  PLANNED INTERVENTIONS: 97164- PT Re-evaluation, 97750- Physical Performance Testing, 97110-Therapeutic exercises, 97530- Therapeutic activity, [id number]- Neuromuscular re-education, 97535- Self Care, [id number]- Manual therapy, [id number]- Gait training, [phone]- Orthotic Initial, [phone]- Orthotic/Prosthetic subsequent, [phone]- Canalith repositioning, [phone]- Electrical stimulation (manual), [phone] (1-2 muscles), [phone] (3+ muscles)- Dry Needling, Patient/Family education, Balance training, Stair training, Taping, Joint mobilization, Joint manipulation, Spinal manipulation, Spinal mobilization, Compression bandaging, Vestibular training, DME instructions, Cryotherapy, and Moist heat  PLAN FOR NEXT SESSION:   Initiate LE strengthening, transfer safety, gait training with appropriate AD Assess HEP compliance   Fonda Simpers, PT, DPT Physical Therapist - [place]  [facility]  12/19/23, 9:07 AM

## 2023-12-17 ENCOUNTER — Ambulatory Visit

## 2023-12-17 DIAGNOSIS — M6281 Muscle weakness (generalized): Secondary | ICD-10-CM | POA: Diagnosis not present

## 2023-12-17 DIAGNOSIS — R278 Other lack of coordination: Secondary | ICD-10-CM

## 2023-12-17 DIAGNOSIS — R262 Difficulty in walking, not elsewhere classified: Secondary | ICD-10-CM

## 2023-12-17 DIAGNOSIS — R2689 Other abnormalities of gait and mobility: Secondary | ICD-10-CM

## 2023-12-17 DIAGNOSIS — R296 Repeated falls: Secondary | ICD-10-CM

## 2023-12-17 DIAGNOSIS — R269 Unspecified abnormalities of gait and mobility: Secondary | ICD-10-CM

## 2023-12-17 NOTE — Therapy (Addendum)
 OUTPATIENT OCCUPATIONAL THERAPY NEURO TREATMENT NOTE  Patient Name: Erika Stanton MRN: [id number] DOB:[birth year], 68 y.o., female Today's Date: 12/19/2023  PCP: Laurice Anes, MD REFERRING PROVIDER: Laurice Anes, MD   OT End of Session - 12/19/23 1032     Visit Number 3   Number of Visits 24    Date for OT Re-Evaluation 03/03/24    OT Start Time 1450    OT Stop Time 1530    OT Time Calculation (min) 40 min    Activity Tolerance Patient tolerated treatment well    Behavior During Therapy WFL for tasks assessed/performed         Past Medical History:  Diagnosis Date   Anatomical narrow angle, bilateral    Aneurysm (HCC)    Asthma    Cerebral aneurysm    Cerebral vasospasm    Cognitive change    Dysphagia, post-stroke    GERD (gastroesophageal reflux disease)    Hemorrhoids    History of cervical dysplasia    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 no recurrent polyps.  suggest repeat surveillance study 11/2022.     COLONOSCOPY W/ POLYPECTOMY  06/2014   Dr Elder at [facility].  3 adenomatoous polyps, anal fissure.     ESOPHAGOGASTRODUODENOSCOPY (EGD) WITH PROPOFOL  N/A 01/27/2020   Procedure: ESOPHAGOGASTRODUODENOSCOPY (EGD) WITH PROPOFOL ;  Surgeon: Shila Gustav GAILS, MD;  Location: [facility] ENDOSCOPY;  Service: Endoscopy;  Laterality: N/A;   INSERTION OF PERMANENT INTRAPERITONEAL CANNULA/CATH N/A    LAPAROSCOPIC   IR 3D INDEPENDENT WKST  01/06/2020   IR ANGIO INTRA EXTRACRAN SEL INTERNAL CAROTID BILAT MOD SED  01/06/2020   IR ANGIO VERTEBRAL SEL  VERTEBRAL UNI L MOD SED  01/06/2020   IR ANGIOGRAM FOLLOW UP STUDY  01/06/2020   IR ANGIOGRAM FOLLOW UP STUDY  01/06/2020   IR ANGIOGRAM FOLLOW UP STUDY  01/06/2020   IR ANGIOGRAM FOLLOW UP STUDY  01/06/2020   IR ANGIOGRAM FOLLOW UP STUDY  01/06/2020   IR ANGIOGRAM FOLLOW UP STUDY  01/06/2020   IR ANGIOGRAM FOLLOW UP STUDY  01/06/2020   IR ANGIOGRAM FOLLOW UP STUDY  01/06/2020   IR ANGIOGRAM FOLLOW UP STUDY  01/06/2020   IR ANGIOGRAM FOLLOW UP STUDY  01/06/2020   IR NEURO EACH ADD'L AFTER BASIC UNI RIGHT (MS)  01/06/2020   IR TRANSCATH/EMBOLIZ  01/06/2020   MASTECTOMY     MOHS SURGERY N/A    RADIOLOGY WITH ANESTHESIA N/A 01/06/2020   Procedure: IR WITH ANESTHESIA FOR ANEURYSM;  Surgeon: Lanis Pupa, MD;  Location: [facility] OR;  Service: Radiology;  Laterality: N/A;   STENT SUPPORTED EMBOLIZATION OF RIGHT ICA ANEURYSM Right    TUBAL LIGATION     VENTRICULO-PERITONEAL SHUNT PLACEMENT / LAPAROSCOPIC INSERTION PERITONEAL CATHETER N/A    Patient Active Problem List   Diagnosis Date Noted   Dyspareunia in female 04/05/2020   History of abnormal cervical Pap smear 03/10/2020   GIB (gastrointestinal bleeding) 02/28/2020   Elevated BUN  Prediabetes    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   Cerebral vasospasm    Sinus tachycardia    Dysphagia, post-stroke    Thrombocytopenia (HCC)    Acute blood loss anemia    Brain aneurysm    ICH (intracerebral hemorrhage) (HCC) 01/05/2020   History of adenomatous polyp of colon 02/07/2016   Anatomical narrow angle, bilateral 12/14/2014   Fissure in ano 11/11/2014   Hemorrhoids 11/11/2014   Constipation 11/19/2013   GERD (gastroesophageal reflux disease) 03/24/2013   Irritable bowel syndrome with diarrhea 03/24/2013   Herpes zoster 05/14/2005   Abnormal Pap smear of cervix 05/15/1983   History of cervical dysplasia 05/15/1983   ONSET DATE: 12/02/2023  REFERRING DIAG: L ischemic CVA  THERAPY DIAG:  Muscle weakness  (generalized)  Other lack of coordination  Rationale for Evaluation and Treatment: Rehabilitation  SUBJECTIVE:  SUBJECTIVE STATEMENT: Pt requested some focus on handwriting this date using L hand. Pt accompanied by: self  PERTINENT HISTORY:  Pt. Was admitted to [facility] [address] from 12/02/23-12/06/23 with an Acute Right MCA CVA, Hydrocephalus with shunt  placement. History of Subarachnoid Hemorrhage with bilateral ICA coiled aneurysms. 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  PRECAUTIONS: None  WEIGHT BEARING RESTRICTIONS: No  PAIN: 12/17/23: 4/10 pain bilat knees  Are you having pain? No  FALLS: Has patient fallen in last 6 months? Yes. Number of falls 1  LIVING ENVIRONMENT: Lives with: lives with their family and lives with their spouse Lives in: House/apartment Stairs: hand rails, 2 steps   Has following equipment at home: [name], cane  PLOF: Independent with basic ADLs, Spouse help as needed  PATIENT GOALS: Strengthening  OBJECTIVE:  Note: Objective measures were completed at Evaluation unless otherwise noted.  HAND DOMINANCE: Left  ADLs: Overall ADLs: spouse assists when needed, Pt. reports that she can do most tasks herself. Uses her left hand to engage in self-care tasks. Eating: Able to cut foods with a fork, husband assists as needed.  Grooming: husband assists with applying toothpaste, able to brush teeth and hair UB Dressing: Spouse assists with applying/fastening bra, Pt. Requires assistance with zippers, and buttons. LB Dressing: Spouse assists with tying shoes when needed, uses slip on shoes. Pt. Does not zip pants. Toileting: Able to complete independently Bathing: Pt. Requires intermittent  assistance with bathing tasks. Tub Shower transfers: Spouse assists with transfers when/if needed Equipment: Shower seat with back  IADLs: Shopping: Spouse typically does the shopping, isnt able to carry heavy objects.  Light housekeeping: Spouse typically does most of the cleaning around the home, Pt. Cleans bathroom, and laundry Pt. Is unable to carry laundry due to exacerbation of weakness.  Meal Prep: Spouse typically cooks and prepares meals.  Community mobility: Pt. Requires assistance using walker. Pt. reports that she is unable to carry heavy items, and typically does not do the shopping.  Medication management: Pt. is able to take care of meds with pillbox Financial management:  Spouse takes care of it, and makes online payments.  Handwriting: 50% legible  MOBILITY STATUS: Needs Assist: Pt. Requires use of a walker   POSTURE COMMENTS:  No Significant postural limitations Sitting balance: Good  ACTIVITY TOLERANCE: Activity tolerance:   FUNCTIONAL OUTCOME MEASURES: TBD   UPPER EXTREMITY ROM:    Active ROM Right  eval Left eval  Shoulder flexion 94(121) 102(125)  Shoulder abduction 75(106) 110(126)  Shoulder adduction    Shoulder extension    Shoulder internal rotation    Shoulder external rotation    Elbow flexion 135(155) 140(150)  Elbow extension 0 -10(-9)  Wrist flexion 40(44) 42(58)  Wrist extension 30(34) 40(50)  Wrist ulnar deviation    Wrist radial deviation    Wrist pronation    Wrist supination    (Blank rows = not tested)  UPPER EXTREMITY MMT:     MMT Right eval Left eval  Shoulder flexion 3-/5 3-/5  Shoulder abduction 3-/5 3-/5  Shoulder adduction    Shoulder extension    Shoulder internal rotation    Shoulder external rotation    Middle trapezius    Lower trapezius    Elbow flexion 4-/5 4+/5  Elbow extension 4-/5 4+/5  Wrist flexion 4-/5 3+/5  Wrist extension 4-/5 3+/5  Wrist ulnar deviation    Wrist radial deviation    Wrist  pronation    Wrist supination    (Blank rows = not tested)  HAND FUNCTION: Grip strength: Right: N/T lbs; Left: 43 lbs, Lateral pinch: Right: N/T lbs, Left: 9 lbs, and 3 point pinch: Right: N/T lbs, Left: 5 lbs Pt. Presents with flexor tightness in the right 5th digit, however is able to passively extend 5th digit within normal range. Pt. Also Presents with hyper extension in the 2nd and 3rd digit at PIP joints. Pt. Is able to achieve full Digit MP, PIP, and DIP PROM.  COORDINATION: 9 Hole Peg test: Right: N/T sec; Left: 51 sec  SENSATION: Not tested  EDEMA:   MUSCLE TONE: Fluctuating flexor tone in R hand.  COGNITION: Overall cognitive status: Within functional limits for tasks assessed  VISION: Subjective report:  Baseline vision:  Visual history:   VISION ASSESSMENT:  PERCEPTION:   PRAXIS:   OBSERVATIONS: Pt. Presents with fluctuating flexor tone in the R hand, involving the 5th digit at the PIP/DIP joint. Pt. Presents with hyperextension at the 2nd and 3rd digits at the PIP joints.                                                                                                                    TREATMENT DATE: 12/17/2023 Therapeutic Activity: Issued pink theraputty and instructed pt in strengthening and coordination exercises for L hand, including gross grasping, lateral/2 point/3 point pinching, digit abd/add, and digging coins out of putty.  Able to return demo with intermittent mod vc for technique to improve quality of movement.  Encouraged completion 5-10 min, 1-2x per day.    Self Care: -Focus on handwriting skills with L hand.  Trial of various pen grips, including Bipgrip, built up foam, and rubber pen grip with finger pad indentations (pt preferred this).  Dycem placed beneath paper to prevent sliding.  Practiced name/initials on lined paper; decreased legibility and set up of assist for pen prehension within thumb, IF, and LF.   PATIENT EDUCATION: Education  details:  HEP Person educated: Patient Education method: Explanation, Demonstration, Tactile cues, Verbal cues, and Handouts Education comprehension: verbalized understanding and returned demonstration  HOME EXERCISE PROGRAM: Pink theraputty; visual handout issued  GOALS: Goals reviewed with patient? Yes  SHORT TERM GOALS: Target date: 01/21/2024    Pt. Will be independent with HEP for UE functioning. Baseline: Eval:No current HEP Goal status: INITIAL  LONG TERM GOALS: Target date: 03/03/2024    Pt. Will improve [facility] skills to be able to be able to manipulate small objects at home Baseline: 9 hole peg test: L: 51 seconds Goal status: INITIAL  2.  Pt. Will increase BUE shoulder ROM by 10 degrees to be able to reach into cabinets and shelves.  Baseline: Shoulder flexion: R: 94(121), L: 102(125), Shoulder abduction: R: 75(106), L: 110(126) Goal status: INITIAL  3.  Pt. Will improve L grip strength by 5# to be able to securely grasp ADL items at home.  Baseline: Grip strength: R: NT, L: 43# Goal status: INITIAL  4.  Pt. Will increase L lateral key pinch strength by 3#  to open wide mouth jars.  Baseline: Lateral key pinch: R: NT, L: 9# Goal status: INITIAL  5.  Pt. Will independently engage the right hand as a gross assist to the left hand 100% of the time during ADLs/IADLs. Baseline: Eval: Pt. With limited engagement of the right hand as a gross assist during tasks.  Goal status: INITIAL  6.  Pt. Will  improve left hand [facility] skills by 3 sec. of speed to be able to manipulate zippers, and buttons efficiently. Baseline: Eval:Right: NT Left: 51 sec.  Goal status: INITIAL  7. Pt. Will improve handwriting legibility to 100% in printed form for written correspondence.  Baseline: 50% legible in printed form for first name only. Goal status: INITIAL  ASSESSMENT:  CLINICAL IMPRESSION: Downgraded to using 1/2 of putty from container for easier manipulation and will progress to full  container when able.  Min vc to engage the R hand as a stabilizer when able.  Min-mod vc to follow visual handout for theraputty.  Further training needed.  Noted impulsivity, distractibility, and delayed motor planning in the L hand, requiring vc to slow down movement attempts with each exercise to improve quality of movements.  Additional focus on L hand use for handwriting.  Pt preferred rubber pen grip and benefited from dycem beneath paper to reduce paper sliding.  Also required set up for pen prehension within L hand digits.  Additional training needed to improve legibility with name/initials.  Pt. will continue to benefit from OT services to increase BUE ROM in the shoulder, wrist and hands, as well as increasing L grip/pinch strength and improve [facility] skills to be able to perform her desired ADL/IADLs.   PERFORMANCE DEFICITS: in functional skills including ADLs, IADLs, coordination, dexterity, edema, tone, ROM, strength, pain, Fine motor control, Gross motor control, endurance, and UE functional use, and psychosocial skills including coping strategies, environmental adaptation, habits, and routines and behaviors.   IMPAIRMENTS: are limiting patient from ADLs, IADLs, rest and sleep, leisure, and social participation.   CO-MORBIDITIES: may have co-morbidities  that affects occupational performance. Patient will benefit from skilled OT to address above impairments and improve overall function.  MODIFICATION OR ASSISTANCE TO COMPLETE EVALUATION: Min-Moderate modification of tasks or assist with assess necessary to complete an evaluation.  OT OCCUPATIONAL PROFILE AND HISTORY: Detailed assessment: Review of records and additional review of physical, cognitive, psychosocial history related to current functional performance.  CLINICAL  DECISION MAKING: Moderate - several treatment options, min-mod task modification necessary  REHAB POTENTIAL: Good  EVALUATION COMPLEXITY: Moderate    PLAN:  OT  FREQUENCY: 2x/week  OT DURATION: 12 weeks  PLANNED INTERVENTIONS: 97168 OT Re-evaluation, 97535 self care/ADL training, 02889 therapeutic exercise, 97530 therapeutic activity, 97112 neuromuscular re-education, 97140 manual therapy, 97018 paraffin, 02960 fluidotherapy, 97010 moist heat, 97010 cryotherapy, 97034 contrast bath, 97032 electrical stimulation (manual), 97760 Orthotic Initial, passive range of motion, energy conservation, coping strategies training, patient/family education, and DME and/or AE instructions  RECOMMENDED OTHER SERVICES: PT   CONSULTED AND AGREED WITH PLAN OF CARE: Patient  PLAN FOR NEXT SESSION: Treatment  Inocente Blazing, MS, OTR/L  12/19/2023, 10:38 AM

## 2023-12-19 ENCOUNTER — Ambulatory Visit

## 2023-12-19 DIAGNOSIS — R278 Other lack of coordination: Secondary | ICD-10-CM

## 2023-12-19 DIAGNOSIS — R2689 Other abnormalities of gait and mobility: Secondary | ICD-10-CM

## 2023-12-19 DIAGNOSIS — R269 Unspecified abnormalities of gait and mobility: Secondary | ICD-10-CM

## 2023-12-19 DIAGNOSIS — M6281 Muscle weakness (generalized): Secondary | ICD-10-CM | POA: Diagnosis not present

## 2023-12-19 DIAGNOSIS — R262 Difficulty in walking, not elsewhere classified: Secondary | ICD-10-CM

## 2023-12-19 NOTE — Therapy (Signed)
 OUTPATIENT OCCUPATIONAL THERAPY NEURO TREATMENT NOTE  Patient Name: Erika Stanton MRN: [id number] DOB:May 17, 1955, 68 y.o., female Today's Date: 12/19/2023  PCP: Laurice Anes, MD REFERRING PROVIDER: Laurice Anes, MD  END OF SESSION:  OT End of Session - 12/18/23 1504     Visit Number 2    Number of Visits 24    Date for OT Re-Evaluation 03/03/24    OT Start Time 1015    OT Stop Time 1101    OT Time Calculation (min) 46 min    Activity Tolerance Patient tolerated treatment well    Behavior During Therapy WFL for tasks assessed/performed               Past Medical History:  Diagnosis Date   Anatomical narrow angle, bilateral    Aneurysm (HCC)    Asthma    Cerebral aneurysm    Cerebral vasospasm    Cognitive change    Dysphagia, post-stroke    GERD (gastroesophageal reflux disease)    Hemorrhoids    History of cervical dysplasia    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 no recurrent polyps.  suggest repeat surveillance study 11/2022.     COLONOSCOPY W/ POLYPECTOMY  06/2014   Dr Elder at [facility].  3 adenomatoous polyps, anal fissure.     ESOPHAGOGASTRODUODENOSCOPY (EGD) WITH PROPOFOL  N/A 01/27/2020   Procedure: ESOPHAGOGASTRODUODENOSCOPY (EGD) WITH PROPOFOL ;  Surgeon: Shila Gustav GAILS, MD;  Location: [facility];  Service: Endoscopy;  Laterality: N/A;   INSERTION OF PERMANENT INTRAPERITONEAL CANNULA/CATH N/A    LAPAROSCOPIC   IR 3D INDEPENDENT WKST  01/06/2020   IR ANGIO INTRA EXTRACRAN SEL INTERNAL CAROTID BILAT MOD SED  01/06/2020    IR ANGIO VERTEBRAL SEL VERTEBRAL UNI L MOD SED  01/06/2020   IR ANGIOGRAM FOLLOW UP STUDY  01/06/2020   IR ANGIOGRAM FOLLOW UP STUDY  01/06/2020   IR ANGIOGRAM FOLLOW UP STUDY  01/06/2020   IR ANGIOGRAM FOLLOW UP STUDY  01/06/2020   IR ANGIOGRAM FOLLOW UP STUDY  01/06/2020   IR ANGIOGRAM FOLLOW UP STUDY  01/06/2020   IR ANGIOGRAM FOLLOW UP STUDY  01/06/2020   IR ANGIOGRAM FOLLOW UP STUDY  01/06/2020   IR ANGIOGRAM FOLLOW UP STUDY  01/06/2020   IR ANGIOGRAM FOLLOW UP STUDY  01/06/2020   IR NEURO EACH ADD'L AFTER BASIC UNI RIGHT (MS)  01/06/2020   IR TRANSCATH/EMBOLIZ  01/06/2020   MASTECTOMY     MOHS SURGERY N/A    RADIOLOGY WITH ANESTHESIA N/A 01/06/2020   Procedure: IR WITH ANESTHESIA FOR ANEURYSM;  Surgeon: Lanis Pupa, MD;  Location: [facility] OR;  Service: Radiology;  Laterality: N/A;   STENT SUPPORTED EMBOLIZATION OF RIGHT ICA ANEURYSM Right    TUBAL LIGATION     VENTRICULO-PERITONEAL SHUNT PLACEMENT / LAPAROSCOPIC INSERTION PERITONEAL CATHETER N/A    Patient Active Problem List   Diagnosis Date Noted   Dyspareunia in female 04/05/2020   History of abnormal cervical Pap smear 03/10/2020   GIB (  gastrointestinal bleeding) 02/28/2020   Elevated BUN    Prediabetes    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   Cerebral vasospasm    Sinus tachycardia    Dysphagia, post-stroke    Thrombocytopenia (HCC)    Acute blood loss anemia    Brain aneurysm    ICH (intracerebral hemorrhage) (HCC) 01/05/2020   History of adenomatous polyp of colon 02/07/2016   Anatomical narrow angle, bilateral 12/14/2014   Fissure in ano 11/11/2014   Hemorrhoids 11/11/2014   Constipation 11/19/2013   GERD (gastroesophageal reflux disease) 03/24/2013   Irritable bowel syndrome with diarrhea 03/24/2013   Herpes zoster 05/14/2005   Abnormal Pap smear of cervix 05/15/1983   History of cervical dysplasia 05/15/1983    ONSET DATE: 12/02/2023  REFERRING DIAG: L ischemic CVA  THERAPY DIAG:  Muscle  weakness (generalized)  Other lack of coordination  Rationale for Evaluation and Treatment: Rehabilitation  SUBJECTIVE:   SUBJECTIVE STATEMENT: See below in treatment note for subjective information Pt accompanied by: self  PERTINENT HISTORY:  Pt. Was admitted to [facility] from 12/02/23-12/06/23 with an Acute Right MCA CVA, Hydrocephalus with shunt  placement. History of Subarachnoid Hemorrhage with bilateral ICA coiled aneurysms. 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  PRECAUTIONS: None  WEIGHT BEARING RESTRICTIONS: No  PAIN:  Are you having pain? No  FALLS: Has patient fallen in last 6 months? Yes. Number of falls 1  LIVING ENVIRONMENT: Lives with: lives with their family and lives with their spouse Lives in: House/apartment Stairs: hand rails, 2 steps   Has following equipment at home: [name], cane  PLOF: Independent with basic ADLs, Spouse help as needed  PATIENT GOALS: Strengthening  OBJECTIVE:  Note: Objective measures were completed at Evaluation unless otherwise noted.  HAND DOMINANCE: Left  ADLs: Overall ADLs: spouse assists when needed, Pt. reports that she can do most tasks herself. Uses her left hand to engage in self-care tasks. Eating: Able to cut foods with a fork, husband assists as needed.  Grooming: husband assists with applying toothpaste, able to brush teeth and hair UB Dressing: Spouse assists with applying/fastening bra, Pt. Requires assistance with zippers, and buttons. LB Dressing: Spouse assists with tying shoes when needed, uses slip on shoes. Pt. Does not zip pants. Toileting: Able to complete independently Bathing: Pt. Requires intermittent assistance with bathing  tasks. Tub Shower transfers: Spouse assists with transfers when/if needed Equipment: Shower seat with back  IADLs: Shopping: Spouse typically does the shopping, isnt able to carry heavy objects.  Light housekeeping: Spouse typically does most of the cleaning around the home, Pt. Cleans bathroom, and laundry Pt. Is unable to carry laundry due to exacerbation of weakness.  Meal Prep: Spouse typically cooks and prepares meals.  Community mobility: Pt. Requires assistance using walker. Pt. reports that she is unable to carry heavy items, and typically does not do the shopping.  Medication management: Pt. is able to take care of meds with pillbox Financial management:  Spouse takes care of it, and makes online payments.  Handwriting: 50% legible  MOBILITY STATUS: Needs Assist: Pt. Requires use of a walker   POSTURE COMMENTS:  No Significant postural limitations Sitting balance: Good  ACTIVITY TOLERANCE: Activity tolerance:   FUNCTIONAL OUTCOME MEASURES: TBD   UPPER EXTREMITY ROM:  Active ROM Right eval Left eval  Shoulder flexion 94(121) 102(125)  Shoulder abduction 75(106) 110(126)  Shoulder adduction    Shoulder extension    Shoulder internal rotation    Shoulder external rotation    Elbow flexion 135(155) 140(150)  Elbow extension 0 -10(-9)  Wrist flexion 40(44) 42(58)  Wrist extension 30(34) 40(50)  Wrist ulnar deviation    Wrist radial deviation    Wrist pronation    Wrist supination    (Blank rows = not tested)  UPPER EXTREMITY MMT:     MMT Right eval Left eval  Shoulder flexion 3-/5 3-/5  Shoulder abduction 3-/5 3-/5  Shoulder adduction    Shoulder extension    Shoulder internal rotation    Shoulder external rotation    Middle trapezius    Lower trapezius    Elbow flexion 4-/5 4+/5  Elbow extension 4-/5 4+/5  Wrist flexion 4-/5 3+/5  Wrist extension 4-/5 3+/5  Wrist ulnar deviation    Wrist radial deviation    Wrist pronation    Wrist supination     (Blank rows = not tested)  HAND FUNCTION: Grip strength: Right: N/T lbs; Left: 43 lbs, Lateral pinch: Right: N/T lbs, Left: 9 lbs, and 3 point pinch: Right: N/T lbs, Left: 5 lbs Pt. Presents with flexor tightness in the right 5th digit, however is able to passively extend 5th digit within normal range. Pt. Also Presents with hyper extension in the 2nd and 3rd digit at PIP joints. Pt. Is able to achieve full Digit MP, PIP, and DIP PROM.  COORDINATION: 9 Hole Peg test: Right: N/T sec; Left: 51 sec  SENSATION: Not tested  EDEMA:   MUSCLE TONE: Fluctuating flexor tone in R hand.  COGNITION: Overall cognitive status: Within functional limits for tasks assessed   OBSERVATIONS: Pt. Presents with fluctuating flexor tone in the R hand, involving the 5th digit at the PIP/DIP joint. Pt. Presents with hyperextension at the 2nd and 3rd digits at the PIP joints.                                                                                             TREATMENT DATE: 12/13/2023  Pt reports her husband will be going out of town soon and her sister will be coming to stay with her and help as needed.  She is excited to spend time with her sister.    Moist heat to bilateral hands for 5 mins prior to ROM and exercises.     Therapeutic exercises:   Pt seen for bilateral hand ROM, left AAROM, RUE PROM along with manual stretching.  She has hyperextension of the PIP joints on her right hand.  Attempted to fit for oval 8 splint however she likely needs a size 9 and we do not currently have in stock.   Focused on her left hand with her new diagnosis affecting the left side now.  Pt seen for strengthening with use of hand gripper with use of 6# on left for 15 reps, cues for proper hand placement on gripper and for sustained gripping patterns.    Neuromuscular re-education: Pt seen for  manipulation of [place]  discs to pick up and turn and flip with left hand, cues for isolated finger movements and  manipulation skills.  Multiple trials completed.  Pt picking up small to med sized buttons and placing into container with cues.     PATIENT EDUCATION: Education details: OT POC, goals, ADL/IADL, and BUE functional status Person educated: Patient Education method: Explanation, Demonstration, Tactile cues, and Verbal cues Education comprehension: verbalized understanding and returned demonstration  HOME EXERCISE PROGRAM: Continue ongoing assessment of need and provide as indicated.    GOALS: Goals reviewed with patient? Yes  SHORT TERM GOALS: Target date: 01/21/2024    Pt. Will be independent with HEP for UE functioning. Baseline: Eval:No current HEP Goal status: INITIAL  LONG TERM GOALS: Target date: 03/03/2024    Pt. Will improve [facility] skills to be able to be able to manipulate small objects at home Baseline: 9 hole peg test: L: 51 seconds Goal status: INITIAL  2.  Pt. Will increase BUE shoulder ROM by 10 degrees to be able to reach into cabinets and shelves.  Baseline: Shoulder flexion: R: 94(121), L: 102(125), Shoulder abduction: R: 75(106), L: 110(126) Goal status: INITIAL  3.  Pt. Will improve L grip strength by 5# to be able to securely grasp ADL items at home.  Baseline: Grip strength: R: NT, L: 43# Goal status: INITIAL  4.  Pt. Will increase L lateral key pinch strength by 3#  to open wide mouth jars.  Baseline: Lateral key pinch: R: NT, L: 9# Goal status: INITIAL  5.  Pt. Will independently engage the right hand as a gross assist to the left hand 100% of the time during ADLs/IADLs. Baseline: Eval: Pt. With limited engagement of the right hand as a gross assist during tasks.  Goal status: INITIAL  6.  Pt. Will  improve left hand [facility] skills by 3 sec. of speed to be able to manipulate zippers, and buttons efficiently. Baseline: Eval:Right: NT Left: 51 sec.  Goal status: INITIAL  7. Pt. Will improve handwriting legibility to 100% in printed form for written  correspondence.  Baseline: 50% legible in printed form for first name only. Goal status: INITIAL  ASSESSMENT:  CLINICAL IMPRESSION:  Pt seen this date with focus on ROM, strength and use of left hand for manipulation skills.  Pt able to perform grip strength on the left with 6# and cues for sustained gripping patterns.  Pt able to manipulate items 1 inch in size but with slower hand skill and benefited from cues for isolated finger movements. Pt demonstrates difficulty with manipulation of buttons from tabletop and also with clothing.  Pt. will benefit from OT services to increase BUE ROM in the shoulder, wrist and hands, as well as increasing L grip/pinch strength and improve [facility] skills to be able to perform her desired ADL/IADLs.   PERFORMANCE DEFICITS: in functional skills including ADLs, IADLs, coordination, dexterity, edema, tone, ROM, strength, pain, Fine motor control, Gross motor control, endurance, and UE functional use, and psychosocial skills including coping strategies, environmental adaptation, habits, and routines and behaviors.   IMPAIRMENTS: are limiting patient from ADLs, IADLs, rest and sleep, leisure, and social participation.   CO-MORBIDITIES: may have co-morbidities  that affects occupational performance. Patient will benefit from skilled OT to address above impairments and improve overall function.  MODIFICATION OR ASSISTANCE TO COMPLETE EVALUATION: Min-Moderate modification of tasks or assist with assess necessary to complete an evaluation.  OT OCCUPATIONAL PROFILE AND HISTORY: Detailed assessment: Review of records and  additional review of physical, cognitive, psychosocial history related to current functional performance.  CLINICAL DECISION MAKING: Moderate - several treatment options, min-mod task modification necessary  REHAB POTENTIAL: Good  EVALUATION COMPLEXITY: Moderate   PLAN:  OT FREQUENCY: 2x/week  OT DURATION: 12 weeks  PLANNED INTERVENTIONS: 97168  OT Re-evaluation, 97535 self care/ADL training, 02889 therapeutic exercise, 97530 therapeutic activity, 97112 neuromuscular re-education, 97140 manual therapy, 97018 paraffin, 02960 fluidotherapy, 97010 moist heat, 97010 cryotherapy, 97034 contrast bath, 97032 electrical stimulation (manual), 97760 Orthotic Initial, passive range of motion, energy conservation, coping strategies training, patient/family education, and DME and/or AE instructions  RECOMMENDED OTHER SERVICES: PT   CONSULTED AND AGREED WITH PLAN OF CARE: Patient  PLAN FOR NEXT SESSION: Treatment  Isao Seltzer T Kylene Zamarron, OTR/L, CLT Trevione Wert, OT  12/19/2023, 3:04 PM

## 2023-12-19 NOTE — Therapy (Signed)
 OUTPATIENT PHYSICAL THERAPY NEURO TREATMENT   Patient Name: NEA GITTENS MRN: [id number] DOB:10-29-1955, 68 y.o., female Today's Date: 12/20/2023   PCP: Dr. Oneil Galloway  REFERRING PROVIDER: Dr. Oneil Galloway  END OF SESSION:  PT End of Session - 12/19/23 1457     Visit Number 4    Number of Visits 24    Date for PT Re-Evaluation 03/03/24    Progress Note Due on Visit 10    PT Start Time 1450    PT Stop Time 1529    PT Time Calculation (min) 39 min    Equipment Utilized During Treatment Gait belt    Activity Tolerance Patient tolerated treatment well    Behavior During Therapy WFL for tasks assessed/performed             Past Medical History:  Diagnosis Date   Anatomical narrow angle, bilateral    Aneurysm (HCC)    Asthma    Cerebral aneurysm    Cerebral vasospasm    Cognitive change    Dysphagia, post-stroke    GERD (gastroesophageal reflux disease)    Hemorrhoids    History of cervical dysplasia    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 no recurrent polyps.  suggest repeat surveillance study 11/2022.     COLONOSCOPY W/ POLYPECTOMY  06/2014   Dr Elder at [facility].  3 adenomatoous polyps, anal fissure.     ESOPHAGOGASTRODUODENOSCOPY (EGD) WITH PROPOFOL  N/A 01/27/2020   Procedure: ESOPHAGOGASTRODUODENOSCOPY (EGD) WITH PROPOFOL ;  Surgeon: Shila Gustav GAILS, MD;  Location: [facility] ENDOSCOPY;  Service: Endoscopy;  Laterality: N/A;   INSERTION OF PERMANENT INTRAPERITONEAL CANNULA/CATH N/A    LAPAROSCOPIC   IR 3D INDEPENDENT WKST  01/06/2020    IR ANGIO INTRA EXTRACRAN SEL INTERNAL CAROTID BILAT MOD SED  01/06/2020   IR ANGIO VERTEBRAL SEL VERTEBRAL UNI L MOD SED  01/06/2020   IR ANGIOGRAM FOLLOW UP STUDY  01/06/2020   IR ANGIOGRAM FOLLOW UP STUDY  01/06/2020   IR ANGIOGRAM FOLLOW UP STUDY  01/06/2020   IR ANGIOGRAM FOLLOW UP STUDY  01/06/2020   IR ANGIOGRAM FOLLOW UP STUDY  01/06/2020   IR ANGIOGRAM FOLLOW UP STUDY  01/06/2020   IR ANGIOGRAM FOLLOW UP STUDY  01/06/2020   IR ANGIOGRAM FOLLOW UP STUDY  01/06/2020   IR ANGIOGRAM FOLLOW UP STUDY  01/06/2020   IR ANGIOGRAM FOLLOW UP STUDY  01/06/2020   IR NEURO EACH ADD'L AFTER BASIC UNI RIGHT (MS)  01/06/2020   IR TRANSCATH/EMBOLIZ  01/06/2020   MASTECTOMY     MOHS SURGERY N/A    RADIOLOGY WITH ANESTHESIA N/A 01/06/2020   Procedure: IR WITH ANESTHESIA FOR ANEURYSM;  Surgeon: Lanis Pupa, MD;  Location: [facility] OR;  Service: Radiology;  Laterality: N/A;   STENT SUPPORTED EMBOLIZATION OF RIGHT ICA ANEURYSM Right    TUBAL LIGATION     VENTRICULO-PERITONEAL SHUNT PLACEMENT / LAPAROSCOPIC INSERTION PERITONEAL CATHETER N/A    Patient Active Problem List   Diagnosis Date Noted  Dyspareunia in female 04/05/2020   History of abnormal cervical Pap smear 03/10/2020   GIB (gastrointestinal bleeding) 02/28/2020   Elevated BUN    Prediabetes    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   Cerebral vasospasm    Sinus tachycardia    Dysphagia, post-stroke    Thrombocytopenia (HCC)    Acute blood loss anemia    Brain aneurysm    ICH (intracerebral hemorrhage) (HCC) 01/05/2020   History of adenomatous polyp of colon 02/07/2016   Anatomical narrow angle, bilateral 12/14/2014   Fissure in ano 11/11/2014   Hemorrhoids 11/11/2014   Constipation 11/19/2013   GERD (gastroesophageal reflux disease) 03/24/2013   Irritable bowel syndrome with diarrhea 03/24/2013   Herpes zoster 05/14/2005   Abnormal Pap smear of cervix 05/15/1983   History of cervical dysplasia 05/15/1983    ONSET  DATE: 12/02/2023  REFERRING DIAG: I63.9 (ICD-10-CM) - CVA (cerebral vascular accident) (HCC)   THERAPY DIAG:  Muscle weakness (generalized)  Other lack of coordination  Abnormality of gait and mobility  Difficulty in walking, not elsewhere classified  Other abnormalities of gait and mobility  Rationale for Evaluation and Treatment: Rehabilitation  SUBJECTIVE:                                                                                                                                                                                             SUBJECTIVE STATEMENT:  Pt reports doing okay today - states always having some knee pain.   From EVAL:Patient reports she was in hospital from [facility]- Sat (July 21st- 25th) with CVA affecting left side. I was supposed to have a knee replacement surgery in September but that is on hold for now. I was doing my prep for my colonoscopy and fell in the bathroom.  Patient reports having mild left sided weakness compared to her CVA in 2021 with heavy Right sided UE weakness. She reports bad OA affecting both knees as well.    Pt accompanied by: Husband brought patient  PERTINENT HISTORY: Previous CVA in 2021 affecting Right side. Reports going to ED  last Monday due to fall/CVA affecting Left side. Diagnosed with R CVA with Left Sided weakness.    PAIN:  Are you having pain? Yes: NPRS scale: Current = 1/10; worst= 6/10 Pain location: Bilateral knees- Arthritis  Pain description: achy yet sharp with weight bearing Aggravating factors: Increased walking, transfers Relieving factors: rest  PRECAUTIONS: Fall  RED FLAGS: None   WEIGHT BEARING RESTRICTIONS: No  FALLS: Has patient fallen in last 6 months? Yes. Number of falls fell with the stroke  LIVING ENVIRONMENT: Lives with: lives with their spouse  Lives in: House/apartment Stairs: Yes: External: 2 steps; on left going up Has following equipment at home: Single point cane, Walker - 2  wheeled, shower chair, and Grab bars  PLOF: Independent- Retired from [employer]  PATIENT GOALS: Walk without any assistive device.  OBJECTIVE:  Note: Objective measures were completed at Evaluation unless otherwise noted.  DIAGNOSTIC FINDINGS: MRI BRAIN WITHOUT CONTRAST  INDICATION: Neuro deficit, acute, stroke suspected, left sided facial weakness, I63.9 Cerebral infarction, unspecified (CMS/HHS-HCC)  COMPARISON: December 02, 2023 CTA head and neck  TECHNIQUE/PROTOCOL: Standard adult protocol brain MR without contrast.  FINDINGS: Brain Parenchyma: Multifocal areas of restricted diffusion within the right cerebral hemisphere; for example, within the right periventricular white matter (series 6 image 47), right posterior temporal lobe (series 6 image 44), inferior right temporal lobe (series 6 image 41), and right parietal lobe (series 6 image 50). No midline shift. Susceptibility artifact from left frontal approach ventriculostomy shunt catheter obscures a majority of the left hemisphere and susceptibility artifact from right ICA terminus coils portions of the right temporal lobe. Encephalomalacia around the coil pack and left frontal parietal lobe, unchanged. Area of FLAIR hyperintensity in the right cerebellar peduncle, likely representing wallerian degeneration. Scattered areas of increased T2/FLAIR signal intensity in the periventricular white matter, non-specific but likely the sequela of chronic small vessel ischemic disease. Ventricles and Sulci: Stable appearance of shunted ventricular system with left frontal approach ventriculostomy catheter with tip terminating near the septum pellucidum. Extra-Axial Spaces: No extra-axial fluid collection. Basal Cisterns: Normal. Intracranial Flow-Voids: Normal.  Paranasal Sinuses: Non-opacified. Mastoids: Normal. Orbits: Normal. Cranium: Multiple burr holes. Visualized upper cervical spine: No high grade stenosis.   IMPRESSION: 1.  Multifocal acute infarcts within the right MCA territory, as above. Distribution may reflect thromboembolic etiology from the more proximal right MCA. 2. Please note that considerable susceptibility artifact from left frontal approach ventriculostomy shunt catheter obscures a majority of the left hemisphere limiting evaluation and precluding assessment for infarct in these locations.  Findings were discussed communicated via Epic secure chat with Devora MD by Lang Payment, MD at 12/03/2023 12:50 AM.  The preliminary report (critical or emergent communication) was reviewed prior to this dictation and there are no substantial differences between the preliminary results and the impressions in this final report.  Electronically Reviewed by:  Metta Fruits, MD, [facility] Electronically Reviewed on:  12/03/2023 11:11 AM  I have reviewed the images and concur with the above findings.  Electronically Signed by:  Evalene Bigger, MD, [facility] Electronically Signed on:  12/03/2023 5:30 PM Procedure Note  Amrhein, Evalene Agent, MD - 12/03/2023 Formatting of this note might be different from the original. MRI BRAIN WITHOUT CONTRAST  INDICATION: Neuro deficit, acute, stroke suspected, left sided facial weakness, I63.9 Cerebral infarction, unspecified (CMS/HHS-HCC)  COMPARISON: December 02, 2023 CTA head and neck  TECHNIQUE/PROTOCOL: Standard adult protocol brain MR without contrast.  FINDINGS: Brain Parenchyma: Multifocal areas of restricted diffusion within the right cerebral hemisphere; for example, within the right periventricular white matter (series 6 image 47), right posterior temporal lobe (series 6 image 44), inferior right temporal lobe (series 6 image 41), and right parietal lobe (series 6 image 50). No midline shift. Susceptibility artifact from left frontal approach ventriculostomy shunt catheter obscures a majority of the left hemisphere and susceptibility artifact from right  ICA terminus coils portions of the right temporal lobe. Encephalomalacia around the coil pack and left frontal parietal lobe, unchanged. Area of FLAIR hyperintensity in the right cerebellar peduncle, likely representing wallerian  degeneration. Scattered areas of increased T2/FLAIR signal intensity in the periventricular white matter, non-specific but likely the sequela of chronic small vessel ischemic disease. Ventricles and Sulci: Stable appearance of shunted ventricular system with left frontal approach ventriculostomy catheter with tip terminating near the septum pellucidum. Extra-Axial Spaces: No extra-axial fluid collection. Basal Cisterns: Normal. Intracranial Flow-Voids: Normal.  Paranasal Sinuses: Non-opacified. Mastoids: Normal. Orbits: Normal. Cranium: Multiple burr holes. Visualized upper cervical spine: No high grade stenosis.   IMPRESSION: 1. Multifocal acute infarcts within the right MCA territory, as above. Distribution may reflect thromboembolic etiology from the more proximal right MCA. 2. Please note that considerable susceptibility artifact from left frontal approach ventriculostomy shunt catheter obscures a majority of the left hemisphere limiting evaluation and precluding assessment for infarct in these locations.  Findings were discussed communicated via Epic secure chat with Devora MD by Lang Payment, MD at 12/03/2023 12:50 AM.  The preliminary report (critical or emergent communication) was reviewed prior to this dictation and there are no substantial differences between the preliminary results and the impressions in this final report.  Electronically Reviewed by:  Metta Fruits, MD, [facility] Electronically Reviewed on:  12/03/2023 11:11 AM  I have reviewed the images and concur with the above findings.  Electronically Signed by:  Evalene Bigger, MD, [facility] Electronically Signed on:  12/03/2023 5:30 PM Exam End: 12/03/23 00:10   Specimen  Collected: 12/02/23 Last Resulted: 12/03/23 17:30  Received From: [facility]  Result Received: 12/09/23 16:07     COGNITION: Overall cognitive status: Within functional limits for tasks assessed   SENSATION: WFL  COORDINATION: Impaired - delayed each LE  EDEMA:  None observed   POSTURE: rounded shoulders and forward head  LOWER EXTREMITY ROM:     Active  Right Eval Left Eval  Hip flexion    Hip extension    Hip abduction    Hip adduction    Hip internal rotation    Hip external rotation    Knee flexion    Knee extension    Ankle dorsiflexion    Ankle plantarflexion    Ankle inversion    Ankle eversion     (Blank rows = not tested)  LOWER EXTREMITY MMT:    MMT Right Eval Left Eval  Hip flexion 4 4  Hip extension    Hip abduction    Hip adduction    Hip internal rotation 2- 2-  Hip external rotation 3+ 3+  Knee flexion 3+ 3+  Knee extension 4 4  Ankle dorsiflexion 4 4  Ankle plantarflexion    Ankle inversion    Ankle eversion    (Blank rows = not tested)  BED MOBILITY:  Not tested  TRANSFERS: Sit to stand: CGA  Assistive device utilized: Environmental consultant - 2 wheeled     Stand to sit: CGA  Assistive device utilized: Environmental consultant - 2 wheeled      RAMP:  Not tested  CURB:  Not tested  STAIRS: Not tested GAIT: Findings: Gait Characteristics: step to pattern, decreased arm swing- Right, decreased arm swing- Left, decreased step length- Right, decreased step length- Left, decreased stance time- Right, decreased stance time- Left, decreased stride length, decreased hip/knee flexion- Right, decreased hip/knee flexion- Left, decreased ankle dorsiflexion- Right, decreased ankle dorsiflexion- Left, wide BOS, poor foot clearance- Right, and poor foot clearance- Left, Distance walked: 60 feet, Assistive device utilized:Walker - 2 wheeled, Level of assistance: CGA, and Comments:    FUNCTIONAL TESTS:  5 times sit to stand:  25.20 sec with LUE  Support Timed up and go (TUG): 40 sec with RW (increased time to place right hand on walker)  6 minute walk test: 482' 10 meter walk test: 0.65 m/s Berg Balance Scale: 42/56    PATIENT SURVEYS:  Stroke Impact Scale - 65.63%                                                                                                                             TREATMENT DATE: 12/20/23   TA:  Sit to stand with LUE Support x 10 with VC for leaning forward. Patient reported some B knee pain Fwd/bwd with SPC x 15 feet x 4 (iniitally counting steps = 20 fwd/34 bwd- improved to 10/21)  with patient having initial difficulty walking in straight line but did improve with practice.  Side stepping x 15 feet to left then back to right (patient reported as hard)    Self care/Home management: Reviewed previously instructed below activities:   Standing heel raise with UE support on mat, 2x10 (instructed to perform slow) Standing toe raise with UE support on mat, 2x10 Standing mini squats with UE support on mat, 2x10 Standing (substituted hip ext for previous mini lunge) with UE support on mat, 2x10 Standing tandem balance stance with UE support on bar, 2x10    PATIENT EDUCATION: Education details: PT plan of care; Discussion of symptoms of CVA and to go to ED as soon as possible. Purpose of PT for balance, strength, coordination and endurance.  Person educated: Patient Education method: Explanation Education comprehension: verbalized understanding  HOME EXERCISE PROGRAM: Access Code: [email or web address] URL: [email or web address] Date: 12/17/2023 Prepared by: Sidra Simpers  Program Notes **Be sure to perform all exercises next to a countertop or sturdy piece of furniture in case you become unsteady.  Exercises - Standing Heel Raise with Support  - 1 x daily - 7 x weekly - 2 sets - 15 reps - Standing Toe Raises at Chair  - 1 x daily - 7 x weekly - 2 sets - 15 reps - Mini Squat with Counter Support   - 1 x daily - 7 x weekly - 2 sets - 15 reps - Lunge with Counter Support  - 1 x daily - 7 x weekly - 2 sets - 15 reps - Standing Tandem Balance with Counter Support  - 1 x daily - 7 x weekly - 2 sets - 2 reps - 30 hold - Standing Single Leg Stance with Counter Support  - 1 x daily - 7 x weekly - 2 sets - 2 reps - 30 hold  GOALS: Goals reviewed with patient? Yes  SHORT TERM GOALS: Target date: 01/21/2024  Pt will be independent with HEP in order to improve strength and balance in order to decrease fall risk and improve function at home.  Baseline: EVAL - No current formal HEP in place Goal status: INITIAL   LONG TERM GOALS: Target date: 03/03/2024  1.  Patient will complete five times sit to stand test in < 15 seconds indicating an increased LE strength and improved balance. Baseline: EVAL= 25.20 sec with LUE Support Goal status: INITIAL   2.  Patient will increase Berg Balance score by > 6 points to demonstrate decreased fall risk during functional activities. Baseline: EVAL: To be assessed next visit; 12/12/2023= 42/56 Goal status: INITIAL   3.  Patient will reduce timed up and go to <11 seconds to reduce fall risk and demonstrate improved transfer/gait ability. Baseline: EVAL = 40 sec with RW Goal status: INITIAL  4.  Patient will increase 10 meter walk test to >1.2m/s as to improve gait speed for better community ambulation and to reduce fall risk. Baseline: EVAL: 0.65 m/s Goal status: INITIAL  5. Patient will increase six minute walk test distance by 200 feet for progression to community ambulator and improve gait ability Baseline: EVAL= 482' Goal status: INITIAL   ASSESSMENT:  CLINICAL IMPRESSION:  Treatment continued with review of previously instructed HEP- VC for correct technique including specific activation of muscle and cadence. She was able to return demonstration with min VC and visual demonstration. She was challenged with dynamic functional moving -  specifically side stepping and retro walking- did demonstrate some in session progress with improved step length and seemed to gain some confidence throughout treatment today.   Pt will continue to benefit from skilled therapy to address remaining deficits in order to improve overall QoL and return to PLOF.     OBJECTIVE IMPAIRMENTS: Abnormal gait, decreased activity tolerance, decreased balance, decreased coordination, decreased endurance, decreased mobility, difficulty walking, decreased ROM, decreased strength, impaired UE functional use, and pain.   ACTIVITY LIMITATIONS: carrying, lifting, bending, sitting, standing, squatting, sleeping, stairs, and transfers  PARTICIPATION LIMITATIONS: meal prep, cleaning, laundry, driving, shopping, community activity, and yard work  PERSONAL FACTORS: 1-2 comorbidities: OA, previous CVA with significant R sided weakness are also affecting patient's functional outcome.   REHAB POTENTIAL: Good  CLINICAL DECISION MAKING: Stable/uncomplicated  EVALUATION COMPLEXITY: Moderate  PLAN:  PT FREQUENCY: 1-2x/week  PT DURATION: 12 weeks  PLANNED INTERVENTIONS: 97164- PT Re-evaluation, 97750- Physical Performance Testing, 97110-Therapeutic exercises, 97530- Therapeutic activity, [id number]- Neuromuscular re-education, 97535- Self Care, [id number]- Manual therapy, [id number]- Gait training, [id number]- Orthotic Initial, [id number]- Orthotic/Prosthetic subsequent, [id number]- Canalith repositioning, [id number]- Electrical stimulation (manual), [phone] (1-2 muscles), 20561 (3+ muscles)- Dry Needling, Patient/Family education, Balance training, Stair training, Taping, Joint mobilization, Joint manipulation, Spinal manipulation, Spinal mobilization, Compression bandaging, Vestibular training, DME instructions, Cryotherapy, and Moist heat  PLAN FOR NEXT SESSION:   Continue with LE strengthening, transfer safety, gait training with appropriate AD Assess HEP compliance and progress as  appropriate.   Chyrl London, PT Physical Therapist - [facility]  12/20/23, 8:28 AM

## 2023-12-23 ENCOUNTER — Ambulatory Visit

## 2023-12-24 ENCOUNTER — Ambulatory Visit

## 2023-12-24 DIAGNOSIS — R2689 Other abnormalities of gait and mobility: Secondary | ICD-10-CM

## 2023-12-24 DIAGNOSIS — R296 Repeated falls: Secondary | ICD-10-CM

## 2023-12-24 DIAGNOSIS — M6281 Muscle weakness (generalized): Secondary | ICD-10-CM

## 2023-12-24 DIAGNOSIS — R278 Other lack of coordination: Secondary | ICD-10-CM

## 2023-12-24 DIAGNOSIS — R262 Difficulty in walking, not elsewhere classified: Secondary | ICD-10-CM

## 2023-12-24 DIAGNOSIS — R269 Unspecified abnormalities of gait and mobility: Secondary | ICD-10-CM

## 2023-12-24 NOTE — Therapy (Signed)
 OUTPATIENT PHYSICAL THERAPY NEURO TREATMENT   Patient Name: Erika Stanton MRN: [id number] DOB:08-Dec-1955, 68 y.o., female Today's Date: 12/25/2023   PCP: Dr. Oneil Galloway  REFERRING PROVIDER: Dr. Oneil Galloway  END OF SESSION:  PT End of Session - 12/24/23 1546     Visit Number 5    Number of Visits 24    Date for PT Re-Evaluation 03/03/24    Progress Note Due on Visit 10    PT Start Time 1543    PT Stop Time 1609    PT Time Calculation (min) 26 min    Equipment Utilized During Treatment Gait belt    Activity Tolerance Patient limited by pain    Behavior During Therapy WFL for tasks assessed/performed              Past Medical History:  Diagnosis Date   Anatomical narrow angle, bilateral    Aneurysm (HCC)    Asthma    Cerebral aneurysm    Cerebral vasospasm    Cognitive change    Dysphagia, post-stroke    GERD (gastroesophageal reflux disease)    Hemorrhoids    History of cervical dysplasia    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 no recurrent polyps.  suggest repeat surveillance study 11/2022.     COLONOSCOPY W/ POLYPECTOMY  06/2014   Dr Elder at [facility].  3 adenomatoous polyps, anal fissure.     ESOPHAGOGASTRODUODENOSCOPY (EGD) WITH PROPOFOL  N/A 01/27/2020   Procedure: ESOPHAGOGASTRODUODENOSCOPY (EGD) WITH PROPOFOL ;  Surgeon: Shila Gustav GAILS, MD;  Location: [facility] END[facility];  Service: Endoscopy;  Laterality: N/A;   INSERTION OF PERMANENT INTRAPERITONEAL CANNULA/CATH N/A    LAPAROSCOPIC   IR 3D INDEPENDENT WKST  01/06/2020   IR  ANGIO INTRA EXTRACRAN SEL INTERNAL CAROTID BILAT MOD SED  01/06/2020   IR ANGIO VERTEBRAL SEL VERTEBRAL UNI L MOD SED  01/06/2020   IR ANGIOGRAM FOLLOW UP STUDY  01/06/2020   IR ANGIOGRAM FOLLOW UP STUDY  01/06/2020   IR ANGIOGRAM FOLLOW UP STUDY  01/06/2020   IR ANGIOGRAM FOLLOW UP STUDY  01/06/2020   IR ANGIOGRAM FOLLOW UP STUDY  01/06/2020   IR ANGIOGRAM FOLLOW UP STUDY  01/06/2020   IR ANGIOGRAM FOLLOW UP STUDY  01/06/2020   IR ANGIOGRAM FOLLOW UP STUDY  01/06/2020   IR ANGIOGRAM FOLLOW UP STUDY  01/06/2020   IR ANGIOGRAM FOLLOW UP STUDY  01/06/2020   IR NEURO EACH ADD'L AFTER BASIC UNI RIGHT (MS)  01/06/2020   IR TRANSCATH/EMBOLIZ  01/06/2020   MASTECTOMY     MOHS SURGERY N/A    RADIOLOGY WITH ANESTHESIA N/A 01/06/2020   Procedure: IR WITH ANESTHESIA FOR ANEURYSM;  Surgeon: Lanis Pupa, MD;  Location: [facility] OR;  Service: Radiology;  Laterality: N/A;   STENT SUPPORTED EMBOLIZATION OF RIGHT ICA ANEURYSM Right    TUBAL LIGATION     VENTRICULO-PERITONEAL SHUNT PLACEMENT / LAPAROSCOPIC INSERTION PERITONEAL CATHETER N/A    Patient Active Problem List   Diagnosis Date Noted  Dyspareunia in female 04/05/2020   History of abnormal cervical Pap smear 03/10/2020   GIB (gastrointestinal bleeding) 02/28/2020   Elevated BUN    Prediabetes    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   Cerebral vasospasm    Sinus tachycardia    Dysphagia, post-stroke    Thrombocytopenia (HCC)    Acute blood loss anemia    Brain aneurysm    ICH (intracerebral hemorrhage) (HCC) 01/05/2020   History of adenomatous polyp of colon 02/07/2016   Anatomical narrow angle, bilateral 12/14/2014   Fissure in ano 11/11/2014   Hemorrhoids 11/11/2014   Constipation 11/19/2013   GERD (gastroesophageal reflux disease) 03/24/2013   Irritable bowel syndrome with diarrhea 03/24/2013   Herpes zoster 05/14/2005   Abnormal Pap smear of cervix 05/15/1983   History of cervical dysplasia 05/15/1983    ONSET DATE:  12/02/2023  REFERRING DIAG: I63.9 (ICD-10-CM) - CVA (cerebral vascular accident) (HCC)   THERAPY DIAG:  Muscle weakness (generalized)  Other lack of coordination  Abnormality of gait and mobility  Difficulty in walking, not elsewhere classified  Other abnormalities of gait and mobility  Repeated falls  Rationale for Evaluation and Treatment: Rehabilitation  SUBJECTIVE:                                                                                                                                                                                             SUBJECTIVE STATEMENT:  Patient reports doing okay- wishing she could walk better.   From EVAL:Patient reports she was in hospital from [facility]- Sat (July 21st- 25th) with CVA affecting left side. I was supposed to have a knee replacement surgery in September but that is on hold for now. I was doing my prep for my colonoscopy and fell in the bathroom.  Patient reports having mild left sided weakness compared to her CVA in 2021 with heavy Right sided UE weakness. She reports bad OA affecting both knees as well.    Pt accompanied by: Husband brought patient  PERTINENT HISTORY: Previous CVA in 2021 affecting Right side. Reports going to ED  last Monday due to fall/CVA affecting Left side. Diagnosed with R CVA with Left Sided weakness.    PAIN:  Are you having pain? Yes: NPRS scale: Current = 1/10; worst= 6/10 Pain location: Bilateral knees- Arthritis  Pain description: achy yet sharp with weight bearing Aggravating factors: Increased walking, transfers Relieving factors: rest  PRECAUTIONS: Fall  RED FLAGS: None   WEIGHT BEARING RESTRICTIONS: No  FALLS: Has patient fallen in last 6 months? Yes. Number of falls fell with the stroke  LIVING ENVIRONMENT: Lives with: lives with their spouse  Lives in: House/apartment Stairs: Yes: External: 2 steps; on left going up Has following equipment at home: Single point cane, Walker - 2  wheeled, shower chair, and Grab bars  PLOF: Independent- Retired from [employer]  PATIENT GOALS: Walk without any assistive device.  OBJECTIVE:  Note: Objective measures were completed at Evaluation unless otherwise noted.  DIAGNOSTIC FINDINGS: MRI BRAIN WITHOUT CONTRAST  INDICATION: Neuro deficit, acute, stroke suspected, left sided facial weakness, I63.9 Cerebral infarction, unspecified (CMS/HHS-HCC)  COMPARISON: December 02, 2023 CTA head and neck  TECHNIQUE/PROTOCOL: Standard adult protocol brain MR without contrast.  FINDINGS: Brain Parenchyma: Multifocal areas of restricted diffusion within the right cerebral hemisphere; for example, within the right periventricular white matter (series 6 image 47), right posterior temporal lobe (series 6 image 44), inferior right temporal lobe (series 6 image 41), and right parietal lobe (series 6 image 50). No midline shift. Susceptibility artifact from left frontal approach ventriculostomy shunt catheter obscures a majority of the left hemisphere and susceptibility artifact from right ICA terminus coils portions of the right temporal lobe. Encephalomalacia around the coil pack and left frontal parietal lobe, unchanged. Area of FLAIR hyperintensity in the right cerebellar peduncle, likely representing wallerian degeneration. Scattered areas of increased T2/FLAIR signal intensity in the periventricular white matter, non-specific but likely the sequela of chronic small vessel ischemic disease. Ventricles and Sulci: Stable appearance of shunted ventricular system with left frontal approach ventriculostomy catheter with tip terminating near the septum pellucidum. Extra-Axial Spaces: No extra-axial fluid collection. Basal Cisterns: Normal. Intracranial Flow-Voids: Normal.  Paranasal Sinuses: Non-opacified. Mastoids: Normal. Orbits: Normal. Cranium: Multiple burr holes. Visualized upper cervical spine: No high grade stenosis.   IMPRESSION: 1.  Multifocal acute infarcts within the right MCA territory, as above. Distribution may reflect thromboembolic etiology from the more proximal right MCA. 2. Please note that considerable susceptibility artifact from left frontal approach ventriculostomy shunt catheter obscures a majority of the left hemisphere limiting evaluation and precluding assessment for infarct in these locations.  Findings were discussed communicated via Epic secure chat with Devora MD by Lang Payment, MD at 12/03/2023 12:50 AM.  The preliminary report (critical or emergent communication) was reviewed prior to this dictation and there are no substantial differences between the preliminary results and the impressions in this final report.  Electronically Reviewed by:  Metta Fruits, MD, [facility] Electronically Reviewed on:  12/03/2023 11:11 AM  I have reviewed the images and concur with the above findings.  Electronically Signed by:  Evalene Bigger, MD, [facility] Electronically Signed on:  12/03/2023 5:30 PM Procedure Note  Amrhein, Evalene Agent, MD - 12/03/2023 Formatting of this note might be different from the original. MRI BRAIN WITHOUT CONTRAST  INDICATION: Neuro deficit, acute, stroke suspected, left sided facial weakness, I63.9 Cerebral infarction, unspecified (CMS/HHS-HCC)  COMPARISON: December 02, 2023 CTA head and neck  TECHNIQUE/PROTOCOL: Standard adult protocol brain MR without contrast.  FINDINGS: Brain Parenchyma: Multifocal areas of restricted diffusion within the right cerebral hemisphere; for example, within the right periventricular white matter (series 6 image 47), right posterior temporal lobe (series 6 image 44), inferior right temporal lobe (series 6 image 41), and right parietal lobe (series 6 image 50). No midline shift. Susceptibility artifact from left frontal approach ventriculostomy shunt catheter obscures a majority of the left hemisphere and susceptibility artifact from right  ICA terminus coils portions of the right temporal lobe. Encephalomalacia around the coil pack and left frontal parietal lobe, unchanged. Area of FLAIR hyperintensity in the right cerebellar peduncle, likely representing wallerian  degeneration. Scattered areas of increased T2/FLAIR signal intensity in the periventricular white matter, non-specific but likely the sequela of chronic small vessel ischemic disease. Ventricles and Sulci: Stable appearance of shunted ventricular system with left frontal approach ventriculostomy catheter with tip terminating near the septum pellucidum. Extra-Axial Spaces: No extra-axial fluid collection. Basal Cisterns: Normal. Intracranial Flow-Voids: Normal.  Paranasal Sinuses: Non-opacified. Mastoids: Normal. Orbits: Normal. Cranium: Multiple burr holes. Visualized upper cervical spine: No high grade stenosis.   IMPRESSION: 1. Multifocal acute infarcts within the right MCA territory, as above. Distribution may reflect thromboembolic etiology from the more proximal right MCA. 2. Please note that considerable susceptibility artifact from left frontal approach ventriculostomy shunt catheter obscures a majority of the left hemisphere limiting evaluation and precluding assessment for infarct in these locations.  Findings were discussed communicated via Epic secure chat with Devora MD by Lang Payment, MD at 12/03/2023 12:50 AM.  The preliminary report (critical or emergent communication) was reviewed prior to this dictation and there are no substantial differences between the preliminary results and the impressions in this final report.  Electronically Reviewed by:  Metta Fruits, MD, [facility] Electronically Reviewed on:  12/03/2023 11:11 AM  I have reviewed the images and concur with the above findings.  Electronically Signed by:  Evalene Bigger, MD, [facility] Electronically Signed on:  12/03/2023 5:30 PM Exam End: 12/03/23 00:10   Specimen  Collected: 12/02/23 Last Resulted: 12/03/23 17:30  Received From: [facility]  Result Received: 12/09/23 16:07     COGNITION: Overall cognitive status: Within functional limits for tasks assessed   SENSATION: WFL  COORDINATION: Impaired - delayed each LE  EDEMA:  None observed   POSTURE: rounded shoulders and forward head  LOWER EXTREMITY ROM:     Active  Right Eval Left Eval  Hip flexion    Hip extension    Hip abduction    Hip adduction    Hip internal rotation    Hip external rotation    Knee flexion    Knee extension    Ankle dorsiflexion    Ankle plantarflexion    Ankle inversion    Ankle eversion     (Blank rows = not tested)  LOWER EXTREMITY MMT:    MMT Right Eval Left Eval  Hip flexion 4 4  Hip extension    Hip abduction    Hip adduction    Hip internal rotation 2- 2-  Hip external rotation 3+ 3+  Knee flexion 3+ 3+  Knee extension 4 4  Ankle dorsiflexion 4 4  Ankle plantarflexion    Ankle inversion    Ankle eversion    (Blank rows = not tested)  BED MOBILITY:  Not tested  TRANSFERS: Sit to stand: CGA  Assistive device utilized: Environmental consultant - 2 wheeled     Stand to sit: CGA  Assistive device utilized: Environmental consultant - 2 wheeled      RAMP:  Not tested  CURB:  Not tested  STAIRS: Not tested GAIT: Findings: Gait Characteristics: step to pattern, decreased arm swing- Right, decreased arm swing- Left, decreased step length- Right, decreased step length- Left, decreased stance time- Right, decreased stance time- Left, decreased stride length, decreased hip/knee flexion- Right, decreased hip/knee flexion- Left, decreased ankle dorsiflexion- Right, decreased ankle dorsiflexion- Left, wide BOS, poor foot clearance- Right, and poor foot clearance- Left, Distance walked: 60 feet, Assistive device utilized:Walker - 2 wheeled, Level of assistance: CGA, and Comments:    FUNCTIONAL TESTS:  5 times sit to stand:  25.20 sec with LUE  Support Timed up and go (TUG): 40 sec with RW (increased time to place right hand on walker)  6 minute walk test: 482' 10 meter walk test: 0.65 m/s Berg Balance Scale: 42/56    PATIENT SURVEYS:  Stroke Impact Scale - 65.63%                                                                                                                             TREATMENT DATE: 12/25/23   TA:  Sit to stand without UE support with airex pad in seat x 5 with VC for leaning forward. Patient reported some B knee pain  Resistive gait using 1.5# AW  with SPC, CGAx 160 feet x 1 - Short reciprocal steps and approx 2/3 way in- patient complaining of increasing B knee pain. Side stepping x 15 feet to left then back to right (patient reported as hard)    NMR:  - step tap onto 1.5 in step without UE Support x 20 reps alt LE- Mild complaint of B knee pain -Step tap onto 4 in step- LUE support x 10 alt LE - Better with some UE Support -   PATIENT EDUCATION: Education details: PT plan of care; Discussion of symptoms of CVA and to go to ED as soon as possible. Purpose of PT for balance, strength, coordination and endurance.  Person educated: Patient Education method: Explanation Education comprehension: verbalized understanding  HOME EXERCISE PROGRAM: Access Code: [email or web address] URL: [email or web address] Date: 12/17/2023 Prepared by: Sidra Simpers  Program Notes **Be sure to perform all exercises next to a countertop or sturdy piece of furniture in case you become unsteady.  Exercises - Standing Heel Raise with Support  - 1 x daily - 7 x weekly - 2 sets - 15 reps - Standing Toe Raises at Chair  - 1 x daily - 7 x weekly - 2 sets - 15 reps - Mini Squat with Counter Support  - 1 x daily - 7 x weekly - 2 sets - 15 reps - Lunge with Counter Support  - 1 x daily - 7 x weekly - 2 sets - 15 reps - Standing Tandem Balance with Counter Support  - 1 x daily - 7 x weekly - 2 sets - 2 reps - 30 hold -  Standing Single Leg Stance with Counter Support  - 1 x daily - 7 x weekly - 2 sets - 2 reps - 30 hold  GOALS: Goals reviewed with patient? Yes  SHORT TERM GOALS: Target date: 01/21/2024  Pt will be independent with HEP in order to improve strength and balance in order to decrease fall risk and improve function at home.  Baseline: EVAL - No current formal HEP in place Goal status: INITIAL   LONG TERM GOALS: Target date: 03/03/2024  1.  Patient will complete five times sit to stand test in < 15 seconds indicating an increased LE strength and improved balance. Baseline: EVAL=  25.20 sec with LUE Support Goal status: INITIAL   2.  Patient will increase Berg Balance score by > 6 points to demonstrate decreased fall risk during functional activities. Baseline: EVAL: To be assessed next visit; 12/12/2023= 42/56 Goal status: INITIAL   3.  Patient will reduce timed up and go to <11 seconds to reduce fall risk and demonstrate improved transfer/gait ability. Baseline: EVAL = 40 sec with RW Goal status: INITIAL  4.  Patient will increase 10 meter walk test to >1.80m/s as to improve gait speed for better community ambulation and to reduce fall risk. Baseline: EVAL: 0.65 m/s Goal status: INITIAL  5. Patient will increase six minute walk test distance by 200 feet for progression to community ambulator and improve gait ability Baseline: EVAL= 482' Goal status: INITIAL   ASSESSMENT:  CLINICAL IMPRESSION:  Treatment was limited today due to patient late arrival then by increased report of B knee pain. She reported that she usually takes tylenol  but did not before session. She was struggling to perform any weight bearing activities and ultimately in more pain - so ceased treatment. Discussed trying to pre-medicate before next treatment to see if this helps. Will also attempt to modify treatment as necessary. Pt will continue to benefit from skilled therapy to address remaining deficits in order to  improve overall QoL and return to PLOF.     OBJECTIVE IMPAIRMENTS: Abnormal gait, decreased activity tolerance, decreased balance, decreased coordination, decreased endurance, decreased mobility, difficulty walking, decreased ROM, decreased strength, impaired UE functional use, and pain.   ACTIVITY LIMITATIONS: carrying, lifting, bending, sitting, standing, squatting, sleeping, stairs, and transfers  PARTICIPATION LIMITATIONS: meal prep, cleaning, laundry, driving, shopping, community activity, and yard work  PERSONAL FACTORS: 1-2 comorbidities: OA, previous CVA with significant R sided weakness are also affecting patient's functional outcome.   REHAB POTENTIAL: Good  CLINICAL DECISION MAKING: Stable/uncomplicated  EVALUATION COMPLEXITY: Moderate  PLAN:  PT FREQUENCY: 1-2x/week  PT DURATION: 12 weeks  PLANNED INTERVENTIONS: 97164- PT Re-evaluation, 97750- Physical Performance Testing, 97110-Therapeutic exercises, 97530- Therapeutic activity, [id number]- Neuromuscular re-education, 97535- Self Care, [id number]- Manual therapy, [id number]- Gait training, [id number]- Orthotic Initial, [phone]- Orthotic/Prosthetic subsequent, [phone]- Canalith repositioning, [id number]- Electrical stimulation (manual), [phone] (1-2 muscles), [id number] (3+ muscles)- Dry Needling, Patient/Family education, Balance training, Stair training, Taping, Joint mobilization, Joint manipulation, Spinal manipulation, Spinal mobilization, Compression bandaging, Vestibular training, DME instructions, Cryotherapy, and Moist heat  PLAN FOR NEXT SESSION:   Continue with LE strengthening, transfer safety, gait training with appropriate AD Assess HEP compliance and progress as appropriate.   Chyrl London, PT Physical Therapist - [facility]  12/25/23, 8:21 AM

## 2023-12-26 ENCOUNTER — Encounter: Payer: Self-pay | Admitting: Occupational Therapy

## 2023-12-26 ENCOUNTER — Ambulatory Visit

## 2023-12-26 ENCOUNTER — Ambulatory Visit: Admitting: Occupational Therapy

## 2023-12-26 DIAGNOSIS — M6281 Muscle weakness (generalized): Secondary | ICD-10-CM

## 2023-12-26 DIAGNOSIS — R278 Other lack of coordination: Secondary | ICD-10-CM

## 2023-12-26 DIAGNOSIS — R269 Unspecified abnormalities of gait and mobility: Secondary | ICD-10-CM

## 2023-12-26 DIAGNOSIS — R296 Repeated falls: Secondary | ICD-10-CM

## 2023-12-26 DIAGNOSIS — R262 Difficulty in walking, not elsewhere classified: Secondary | ICD-10-CM

## 2023-12-26 DIAGNOSIS — R2689 Other abnormalities of gait and mobility: Secondary | ICD-10-CM

## 2023-12-26 NOTE — Therapy (Signed)
 OUTPATIENT OCCUPATIONAL THERAPY NEURO TREATMENT NOTE  Patient Name: Erika Stanton MRN: [id number] DOB:06-25-1955, 68 y.o., female Today's Date: 12/26/2023  PCP: Laurice Anes, MD REFERRING PROVIDER: Laurice Anes, MD   OT End of Session - 12/19/23 1032     Visit Number 4   Number of Visits 24    Date for OT Re-Evaluation 03/03/24    OT Start Time 1450    OT Stop Time 1530    OT Time Calculation (min) 40 min    Activity Tolerance Patient tolerated treatment well    Behavior During Therapy WFL for tasks assessed/performed         Past Medical History:  Diagnosis Date   Anatomical narrow angle, bilateral    Aneurysm (HCC)    Asthma    Cerebral aneurysm    Cerebral vasospasm    Cognitive change    Dysphagia, post-stroke    GERD (gastroesophageal reflux disease)    Hemorrhoids    History of cervical dysplasia    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 no recurrent polyps.  suggest repeat surveillance study 11/2022.     COLONOSCOPY W/ POLYPECTOMY  06/2014   Dr Elder at [facility].  3 adenomatoous polyps, anal fissure.     ESOPHAGOGASTRODUODENOSCOPY (EGD) WITH PROPOFOL  N/A 01/27/2020   Procedure: ESOPHAGOGASTRODUODENOSCOPY (EGD) WITH PROPOFOL ;  Surgeon: Shila Gustav GAILS, MD;  Location: [facility] ENDOSCOPY;  Service: Endoscopy;  Laterality: N/A;   INSERTION OF PERMANENT INTRAPERITONEAL CANNULA/CATH N/A    LAPAROSCOPIC   IR 3D INDEPENDENT WKST  01/06/2020   IR ANGIO INTRA EXTRACRAN SEL INTERNAL CAROTID BILAT MOD SED  01/06/2020   IR ANGIO VERTEBRAL SEL  VERTEBRAL UNI L MOD SED  01/06/2020   IR ANGIOGRAM FOLLOW UP STUDY  01/06/2020   IR ANGIOGRAM FOLLOW UP STUDY  01/06/2020   IR ANGIOGRAM FOLLOW UP STUDY  01/06/2020   IR ANGIOGRAM FOLLOW UP STUDY  01/06/2020   IR ANGIOGRAM FOLLOW UP STUDY  01/06/2020   IR ANGIOGRAM FOLLOW UP STUDY  01/06/2020   IR ANGIOGRAM FOLLOW UP STUDY  01/06/2020   IR ANGIOGRAM FOLLOW UP STUDY  01/06/2020   IR ANGIOGRAM FOLLOW UP STUDY  01/06/2020   IR ANGIOGRAM FOLLOW UP STUDY  01/06/2020   IR NEURO EACH ADD'L AFTER BASIC UNI RIGHT (MS)  01/06/2020   IR TRANSCATH/EMBOLIZ  01/06/2020   MASTECTOMY     MOHS SURGERY N/A    RADIOLOGY WITH ANESTHESIA N/A 01/06/2020   Procedure: IR WITH ANESTHESIA FOR ANEURYSM;  Surgeon: Lanis Pupa, MD;  Location: [facility] OR;  Service: Radiology;  Laterality: N/A;   STENT SUPPORTED EMBOLIZATION OF RIGHT ICA ANEURYSM Right    TUBAL LIGATION     VENTRICULO-PERITONEAL SHUNT PLACEMENT / LAPAROSCOPIC INSERTION PERITONEAL CATHETER N/A    Patient Active Problem List   Diagnosis Date Noted   Dyspareunia in female 04/05/2020   History of abnormal cervical Pap smear 03/10/2020   GIB (gastrointestinal bleeding) 02/28/2020   Elevated BUN  Prediabetes    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   Cerebral vasospasm    Sinus tachycardia    Dysphagia, post-stroke    Thrombocytopenia (HCC)    Acute blood loss anemia    Brain aneurysm    ICH (intracerebral hemorrhage) (HCC) 01/05/2020   History of adenomatous polyp of colon 02/07/2016   Anatomical narrow angle, bilateral 12/14/2014   Fissure in ano 11/11/2014   Hemorrhoids 11/11/2014   Constipation 11/19/2013   GERD (gastroesophageal reflux disease) 03/24/2013   Irritable bowel syndrome with diarrhea 03/24/2013   Herpes zoster 05/14/2005   Abnormal Pap smear of cervix 05/15/1983   History of cervical dysplasia 05/15/1983   ONSET DATE: 12/02/2023  REFERRING DIAG: L ischemic CVA  THERAPY DIAG:  Other lack of  coordination  Muscle weakness (generalized)  Rationale for Evaluation and Treatment: Rehabilitation  SUBJECTIVE:  SUBJECTIVE STATEMENT: Pt reports she has been using her putty.  Pt accompanied by: self  PERTINENT HISTORY:  Pt. Was admitted to [facility] from 12/02/23-12/06/23 with an Acute Right MCA CVA, Hydrocephalus with shunt  placement. History of Subarachnoid Hemorrhage with bilateral ICA coiled aneurysms. 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  PRECAUTIONS: None  WEIGHT BEARING RESTRICTIONS: No  PAIN: 12/17/23: 4/10 pain bilat knees  Are you having pain? No  FALLS: Has patient fallen in last 6 months? Yes. Number of falls 1  LIVING ENVIRONMENT: Lives with: lives with their family and lives with their spouse Lives in: House/apartment Stairs: hand rails, 2 steps   Has following equipment at home: [name], cane  PLOF: Independent with basic ADLs, Spouse help as needed  PATIENT GOALS: Strengthening  OBJECTIVE:  Note: Objective measures were completed at Evaluation unless otherwise noted.  HAND DOMINANCE: Left  ADLs: Overall ADLs: spouse assists when needed, Pt. reports that she can do most tasks herself. Uses her left hand to engage in self-care tasks. Eating: Able to cut foods with a fork, husband assists as needed.  Grooming: husband assists with applying toothpaste, able to brush teeth and hair UB Dressing: Spouse assists with applying/fastening bra, Pt. Requires assistance with zippers, and buttons. LB Dressing: Spouse assists with tying shoes when needed, uses slip on shoes. Pt. Does not zip pants. Toileting: Able to complete independently Bathing: Pt. Requires intermittent assistance with bathing  tasks. Tub Shower transfers: Spouse assists with transfers when/if needed Equipment: Shower seat with back  IADLs: Shopping: Spouse typically does the shopping, isnt able to carry heavy objects.  Light housekeeping: Spouse typically does most of the cleaning around the home, Pt. Cleans bathroom, and laundry Pt. Is unable to carry laundry due to exacerbation of weakness.  Meal Prep: Spouse typically cooks and prepares meals.  Community mobility: Pt. Requires assistance using walker. Pt. reports that she is unable to carry heavy items, and typically does not do the shopping.  Medication management: Pt. is able to take care of meds with pillbox Financial management:  Spouse takes care of it, and makes online payments.  Handwriting: 50% legible  MOBILITY STATUS: Needs Assist: Pt. Requires use of a walker   POSTURE COMMENTS:  No Significant postural limitations Sitting balance: Good  ACTIVITY TOLERANCE: Activity tolerance:   FUNCTIONAL OUTCOME MEASURES: TBD   UPPER EXTREMITY ROM:    Active ROM Right eval Left  eval  Shoulder flexion 94(121) 102(125)  Shoulder abduction 75(106) 110(126)  Shoulder adduction    Shoulder extension    Shoulder internal rotation    Shoulder external rotation    Elbow flexion 135(155) 140(150)  Elbow extension 0 -10(-9)  Wrist flexion 40(44) 42(58)  Wrist extension 30(34) 40(50)  Wrist ulnar deviation    Wrist radial deviation    Wrist pronation    Wrist supination    (Blank rows = not tested)  UPPER EXTREMITY MMT:     MMT Right eval Left eval  Shoulder flexion 3-/5 3-/5  Shoulder abduction 3-/5 3-/5  Shoulder adduction    Shoulder extension    Shoulder internal rotation    Shoulder external rotation    Middle trapezius    Lower trapezius    Elbow flexion 4-/5 4+/5  Elbow extension 4-/5 4+/5  Wrist flexion 4-/5 3+/5  Wrist extension 4-/5 3+/5  Wrist ulnar deviation    Wrist radial deviation    Wrist pronation    Wrist supination     (Blank rows = not tested)  HAND FUNCTION: Grip strength: Right: N/T lbs; Left: 43 lbs, Lateral pinch: Right: N/T lbs, Left: 9 lbs, and 3 point pinch: Right: N/T lbs, Left: 5 lbs Pt. Presents with flexor tightness in the right 5th digit, however is able to passively extend 5th digit within normal range. Pt. Also Presents with hyper extension in the 2nd and 3rd digit at PIP joints. Pt. Is able to achieve full Digit MP, PIP, and DIP PROM.  COORDINATION: 9 Hole Peg test: Right: N/T sec; Left: 51 sec  SENSATION: Not tested  EDEMA:   MUSCLE TONE: Fluctuating flexor tone in R hand.  COGNITION: Overall cognitive status: Within functional limits for tasks assessed  VISION: Subjective report:  Baseline vision:  Visual history:   VISION ASSESSMENT:  PERCEPTION:   PRAXIS:   OBSERVATIONS: Pt. Presents with fluctuating flexor tone in the R hand, involving the 5th digit at the PIP/DIP joint. Pt. Presents with hyperextension at the 2nd and 3rd digits at the PIP joints.                                                                                                                    TREATMENT DATE: 12/26/2023 Therapeutic Exercises: - Isolated digit strengthening placing push pins into resistive cork board.  -functional grip strengthening using 2 red rubberbands.  -red theraband exercises LUE sitting 1 set x 10 reps each: elbow flexion, elbow extension, shoulder flexion, shoulder IR, chest press  Neuromuscular Re-education:  -Pt performed [facility] tasks using the Grooved pegboard. Pt worked on grasping the grooved pegs from a horizontal position and worked on Comptroller, moving the pegs to a vertical position in the hand to prepare for placing them in the grooved slot.   PATIENT EDUCATION: Education details: HEP Person educated: Patient Education method: Programmer, multimedia, Facilities manager, Actor cues, Verbal cues, and Handouts Education comprehension: verbalized understanding and returned  demonstration  HOME EXERCISE PROGRAM: Pink theraputty; visual handout issued  GOALS: Goals reviewed with patient? Yes  SHORT TERM GOALS: Target date: 01/21/2024    Pt. Will be independent with HEP for UE functioning. Baseline: Eval:No current HEP Goal status: INITIAL  LONG TERM GOALS: Target date: 03/03/2024    Pt. Will improve [facility] skills to be able to be able to manipulate small objects at home Baseline: 9 hole peg test: L: 51 seconds Goal status: INITIAL  2.  Pt. Will increase BUE shoulder ROM by 10 degrees to be able to reach into cabinets and shelves.  Baseline: Shoulder flexion: R: 94(121), L: 102(125), Shoulder abduction: R: 75(106), L: 110(126) Goal status: INITIAL  3.  Pt. Will improve L grip strength by 5# to be able to securely grasp ADL items at home.  Baseline: Grip strength: R: NT, L: 43# Goal status: INITIAL  4.  Pt. Will increase L lateral key pinch strength by 3#  to open wide mouth jars.  Baseline: Lateral key pinch: R: NT, L: 9# Goal status: INITIAL  5.  Pt. Will independently engage the right hand as a gross assist to the left hand 100% of the time during ADLs/IADLs. Baseline: Eval: Pt. With limited engagement of the right hand as a gross assist during tasks.  Goal status: INITIAL  6.  Pt. Will  improve left hand [facility] skills by 3 sec. of speed to be able to manipulate zippers, and buttons efficiently. Baseline: Eval:Right: NT Left: 51 sec.  Goal status: INITIAL  7. Pt. Will improve handwriting legibility to 100% in printed form for written correspondence.  Baseline: 50% legible in printed form for first name only. Goal status: INITIAL  ASSESSMENT:  CLINICAL IMPRESSION:   Good tolerance for seated LUE exercises, red theraband provided for home use. Requires encouragement throughout session. Dropped 3/12 grooved pegs in L hand, reports sensation deficits. Pt. will continue to benefit from OT services to increase BUE ROM in the shoulder, wrist and  hands, as well as increasing L grip/pinch strength and improve [facility] skills to be able to perform her desired ADL/IADLs.   PERFORMANCE DEFICITS: in functional skills including ADLs, IADLs, coordination, dexterity, edema, tone, ROM, strength, pain, Fine motor control, Gross motor control, endurance, and UE functional use, and psychosocial skills including coping strategies, environmental adaptation, habits, and routines and behaviors.   IMPAIRMENTS: are limiting patient from ADLs, IADLs, rest and sleep, leisure, and social participation.   CO-MORBIDITIES: may have co-morbidities  that affects occupational performance. Patient will benefit from skilled OT to address above impairments and improve overall function.  MODIFICATION OR ASSISTANCE TO COMPLETE EVALUATION: Min-Moderate modification of tasks or assist with assess necessary to complete an evaluation.  OT OCCUPATIONAL PROFILE AND HISTORY: Detailed assessment: Review of records and additional review of physical, cognitive, psychosocial history related to current functional performance.  CLINICAL DECISION MAKING: Moderate - several treatment options, min-mod task modification necessary  REHAB POTENTIAL: Good  EVALUATION COMPLEXITY: Moderate    PLAN:  OT FREQUENCY: 2x/week  OT DURATION: 12 weeks  PLANNED INTERVENTIONS: 97168 OT Re-evaluation, 97535 self care/ADL training, 02889 therapeutic exercise, 97530 therapeutic activity, 97112 neuromuscular re-education, 97140 manual therapy, 97018 paraffin, 02960 fluidotherapy, 97010 moist heat, 97010 cryotherapy, 97034 contrast bath, 97032 electrical stimulation (manual), 97760 Orthotic Initial, passive range of motion, energy conservation, coping strategies training, patient/family education, and DME and/or AE instructions  RECOMMENDED OTHER SERVICES: PT   CONSULTED AND AGREED WITH PLAN OF CARE: Patient  PLAN FOR NEXT SESSION: Treatment  Elston Slot, M.S. OTR/L  12/26/23, 2:53 PM  ascom  [phone]  12/26/2023, 2:53 PM

## 2023-12-26 NOTE — Therapy (Signed)
 OUTPATIENT PHYSICAL THERAPY NEURO TREATMENT   Patient Name: Erika Stanton MRN: [id number] DOB:04-Apr-1956, 68 y.o., female Today's Date: 12/26/2023   PCP: Dr. Oneil Galloway  REFERRING PROVIDER: Dr. Oneil Galloway  END OF SESSION:  PT End of Session - 12/26/23 1534     Visit Number 6    Number of Visits 24    Date for PT Re-Evaluation 03/03/24    Progress Note Due on Visit 10    PT Start Time 1530    PT Stop Time 1615    PT Time Calculation (min) 45 min    Equipment Utilized During Treatment Gait belt    Activity Tolerance Patient limited by pain    Behavior During Therapy WFL for tasks assessed/performed          Past Medical History:  Diagnosis Date   Anatomical narrow angle, bilateral    Aneurysm (HCC)    Asthma    Cerebral aneurysm    Cerebral vasospasm    Cognitive change    Dysphagia, post-stroke    GERD (gastroesophageal reflux disease)    Hemorrhoids    History of cervical dysplasia    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 no recurrent polyps.  suggest repeat surveillance study 11/2022.     COLONOSCOPY W/ POLYPECTOMY  06/2014   Dr Elder at [facility].  3 adenomatoous polyps, anal fissure.     ESOPHAGOGASTRODUODENOSCOPY (EGD) WITH PROPOFOL  N/A 01/27/2020   Procedure: ESOPHAGOGASTRODUODENOSCOPY (EGD) WITH PROPOFOL ;  Surgeon: Shila Gustav GAILS, MD;  Location: [facility];  Service: Endoscopy;  Laterality: N/A;   INSERTION OF PERMANENT INTRAPERITONEAL CANNULA/CATH N/A    LAPAROSCOPIC   IR 3D INDEPENDENT WKST  01/06/2020   IR ANGIO  INTRA EXTRACRAN SEL INTERNAL CAROTID BILAT MOD SED  01/06/2020   IR ANGIO VERTEBRAL SEL VERTEBRAL UNI L MOD SED  01/06/2020   IR ANGIOGRAM FOLLOW UP STUDY  01/06/2020   IR ANGIOGRAM FOLLOW UP STUDY  01/06/2020   IR ANGIOGRAM FOLLOW UP STUDY  01/06/2020   IR ANGIOGRAM FOLLOW UP STUDY  01/06/2020   IR ANGIOGRAM FOLLOW UP STUDY  01/06/2020   IR ANGIOGRAM FOLLOW UP STUDY  01/06/2020   IR ANGIOGRAM FOLLOW UP STUDY  01/06/2020   IR ANGIOGRAM FOLLOW UP STUDY  01/06/2020   IR ANGIOGRAM FOLLOW UP STUDY  01/06/2020   IR ANGIOGRAM FOLLOW UP STUDY  01/06/2020   IR NEURO EACH ADD'L AFTER BASIC UNI RIGHT (MS)  01/06/2020   IR TRANSCATH/EMBOLIZ  01/06/2020   MASTECTOMY     MOHS SURGERY N/A    RADIOLOGY WITH ANESTHESIA N/A 01/06/2020   Procedure: IR WITH ANESTHESIA FOR ANEURYSM;  Surgeon: Lanis Pupa, MD;  Location: [facility] OR;  Service: Radiology;  Laterality: N/A;   STENT SUPPORTED EMBOLIZATION OF RIGHT ICA ANEURYSM Right    TUBAL LIGATION     VENTRICULO-PERITONEAL SHUNT PLACEMENT / LAPAROSCOPIC INSERTION PERITONEAL CATHETER N/A    Patient Active Problem List   Diagnosis Date Noted   Dyspareunia in  female 04/05/2020   History of abnormal cervical Pap smear 03/10/2020   GIB (gastrointestinal bleeding) 02/28/2020   Elevated BUN    Prediabetes    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   Cerebral vasospasm    Sinus tachycardia    Dysphagia, post-stroke    Thrombocytopenia (HCC)    Acute blood loss anemia    Brain aneurysm    ICH (intracerebral hemorrhage) (HCC) 01/05/2020   History of adenomatous polyp of colon 02/07/2016   Anatomical narrow angle, bilateral 12/14/2014   Fissure in ano 11/11/2014   Hemorrhoids 11/11/2014   Constipation 11/19/2013   GERD (gastroesophageal reflux disease) 03/24/2013   Irritable bowel syndrome with diarrhea 03/24/2013   Herpes zoster 05/14/2005   Abnormal Pap smear of cervix 05/15/1983   History of cervical dysplasia 05/15/1983    ONSET DATE:  12/02/2023  REFERRING DIAG: I63.9 (ICD-10-CM) - CVA (cerebral vascular accident) (HCC)   THERAPY DIAG:  Other lack of coordination  Muscle weakness (generalized)  Abnormality of gait and mobility  Difficulty in walking, not elsewhere classified  Other abnormalities of gait and mobility  Repeated falls  Rationale for Evaluation and Treatment: Rehabilitation  SUBJECTIVE:                                                                                                                                                                                             SUBJECTIVE STATEMENT:  Pt reports doing ok.  Pt reports she took some tylenol  before coming, but the pain in her knees are still bothering.  Pt reports 3/10 pain in the LE's.     From EVAL:Patient reports she was in hospital from [facility]- Sat (July 21st- 25th) with CVA affecting left side. I was supposed to have a knee replacement surgery in September but that is on hold for now. I was doing my prep for my colonoscopy and fell in the bathroom.  Patient reports having mild left sided weakness compared to her CVA in 2021 with heavy Right sided UE weakness. She reports bad OA affecting both knees as well.    Pt accompanied by: Husband brought patient  PERTINENT HISTORY: Previous CVA in 2021 affecting Right side. Reports going to ED  last Monday due to fall/CVA affecting Left side. Diagnosed with R CVA with Left Sided weakness.    PAIN:  Are you having pain? Yes: NPRS scale: Current = 1/10; worst= 6/10 Pain location: Bilateral knees- Arthritis  Pain description: achy yet sharp with weight bearing Aggravating factors: Increased walking, transfers Relieving factors: rest  PRECAUTIONS: Fall  RED FLAGS: None   WEIGHT BEARING RESTRICTIONS: No  FALLS: Has patient fallen  in last 6 months? Yes. Number of falls fell with the stroke  LIVING ENVIRONMENT: Lives with: lives with their spouse Lives in: House/apartment Stairs: Yes:  External: 2 steps; on left going up Has following equipment at home: Single point cane, Walker - 2 wheeled, shower chair, and Grab bars  PLOF: Independent- Retired from [employer]  PATIENT GOALS: Walk without any assistive device.  OBJECTIVE:  Note: Objective measures were completed at Evaluation unless otherwise noted.  DIAGNOSTIC FINDINGS: MRI BRAIN WITHOUT CONTRAST  INDICATION: Neuro deficit, acute, stroke suspected, left sided facial weakness, I63.9 Cerebral infarction, unspecified (CMS/HHS-HCC)  COMPARISON: December 02, 2023 CTA head and neck  TECHNIQUE/PROTOCOL: Standard adult protocol brain MR without contrast.  FINDINGS: Brain Parenchyma: Multifocal areas of restricted diffusion within the right cerebral hemisphere; for example, within the right periventricular white matter (series 6 image 47), right posterior temporal lobe (series 6 image 44), inferior right temporal lobe (series 6 image 41), and right parietal lobe (series 6 image 50). No midline shift. Susceptibility artifact from left frontal approach ventriculostomy shunt catheter obscures a majority of the left hemisphere and susceptibility artifact from right ICA terminus coils portions of the right temporal lobe. Encephalomalacia around the coil pack and left frontal parietal lobe, unchanged. Area of FLAIR hyperintensity in the right cerebellar peduncle, likely representing wallerian degeneration. Scattered areas of increased T2/FLAIR signal intensity in the periventricular white matter, non-specific but likely the sequela of chronic small vessel ischemic disease. Ventricles and Sulci: Stable appearance of shunted ventricular system with left frontal approach ventriculostomy catheter with tip terminating near the septum pellucidum. Extra-Axial Spaces: No extra-axial fluid collection. Basal Cisterns: Normal. Intracranial Flow-Voids: Normal.  Paranasal Sinuses: Non-opacified. Mastoids: Normal. Orbits: Normal. Cranium:  Multiple burr holes. Visualized upper cervical spine: No high grade stenosis.   IMPRESSION: 1. Multifocal acute infarcts within the right MCA territory, as above. Distribution may reflect thromboembolic etiology from the more proximal right MCA. 2. Please note that considerable susceptibility artifact from left frontal approach ventriculostomy shunt catheter obscures a majority of the left hemisphere limiting evaluation and precluding assessment for infarct in these locations.  Findings were discussed communicated via Epic secure chat with Devora MD by Lang Payment, MD at 12/03/2023 12:50 AM.  The preliminary report (critical or emergent communication) was reviewed prior to this dictation and there are no substantial differences between the preliminary results and the impressions in this final report.  Electronically Reviewed by:  Metta Fruits, MD, [facility] Electronically Reviewed on:  12/03/2023 11:11 AM  I have reviewed the images and concur with the above findings.  Electronically Signed by:  Evalene Bigger, MD, [facility] Electronically Signed on:  12/03/2023 5:30 PM Procedure Note  Amrhein, Evalene Agent, MD - 12/03/2023 Formatting of this note might be different from the original. MRI BRAIN WITHOUT CONTRAST  INDICATION: Neuro deficit, acute, stroke suspected, left sided facial weakness, I63.9 Cerebral infarction, unspecified (CMS/HHS-HCC)  COMPARISON: December 02, 2023 CTA head and neck  TECHNIQUE/PROTOCOL: Standard adult protocol brain MR without contrast.  FINDINGS: Brain Parenchyma: Multifocal areas of restricted diffusion within the right cerebral hemisphere; for example, within the right periventricular white matter (series 6 image 47), right posterior temporal lobe (series 6 image 44), inferior right temporal lobe (series 6 image 41), and right parietal lobe (series 6 image 50). No midline shift. Susceptibility artifact from left frontal approach  ventriculostomy shunt catheter obscures a majority of the left hemisphere and susceptibility artifact from right ICA terminus coils portions of the right temporal lobe. Encephalomalacia around  the coil pack and left frontal parietal lobe, unchanged. Area of FLAIR hyperintensity in the right cerebellar peduncle, likely representing wallerian degeneration. Scattered areas of increased T2/FLAIR signal intensity in the periventricular white matter, non-specific but likely the sequela of chronic small vessel ischemic disease. Ventricles and Sulci: Stable appearance of shunted ventricular system with left frontal approach ventriculostomy catheter with tip terminating near the septum pellucidum. Extra-Axial Spaces: No extra-axial fluid collection. Basal Cisterns: Normal. Intracranial Flow-Voids: Normal.  Paranasal Sinuses: Non-opacified. Mastoids: Normal. Orbits: Normal. Cranium: Multiple burr holes. Visualized upper cervical spine: No high grade stenosis.   IMPRESSION: 1. Multifocal acute infarcts within the right MCA territory, as above. Distribution may reflect thromboembolic etiology from the more proximal right MCA. 2. Please note that considerable susceptibility artifact from left frontal approach ventriculostomy shunt catheter obscures a majority of the left hemisphere limiting evaluation and precluding assessment for infarct in these locations.  Findings were discussed communicated via Epic secure chat with Devora MD by Lang Payment, MD at 12/03/2023 12:50 AM.  The preliminary report (critical or emergent communication) was reviewed prior to this dictation and there are no substantial differences between the preliminary results and the impressions in this final report.  Electronically Reviewed by:  Metta Fruits, MD, [facility] Electronically Reviewed on:  12/03/2023 11:11 AM  I have reviewed the images and concur with the above findings.  Electronically Signed by:  Evalene Bigger, MD, [facility] Electronically Signed on:  12/03/2023 5:30 PM Exam End: 12/03/23 00:10   Specimen Collected: 12/02/23 Last Resulted: 12/03/23 17:30  Received From: [facility]  Result Received: 12/09/23 16:07     COGNITION: Overall cognitive status: Within functional limits for tasks assessed   SENSATION: WFL  COORDINATION: Impaired - delayed each LE  EDEMA:  None observed   POSTURE: rounded shoulders and forward head  LOWER EXTREMITY ROM:     Active  Right Eval Left Eval  Hip flexion    Hip extension    Hip abduction    Hip adduction    Hip internal rotation    Hip external rotation    Knee flexion    Knee extension    Ankle dorsiflexion    Ankle plantarflexion    Ankle inversion    Ankle eversion     (Blank rows = not tested)  LOWER EXTREMITY MMT:    MMT Right Eval Left Eval  Hip flexion 4 4  Hip extension    Hip abduction    Hip adduction    Hip internal rotation 2- 2-  Hip external rotation 3+ 3+  Knee flexion 3+ 3+  Knee extension 4 4  Ankle dorsiflexion 4 4  Ankle plantarflexion    Ankle inversion    Ankle eversion    (Blank rows = not tested)  BED MOBILITY:  Not tested  TRANSFERS: Sit to stand: CGA  Assistive device utilized: Environmental consultant - 2 wheeled     Stand to sit: CGA  Assistive device utilized: Environmental consultant - 2 wheeled      RAMP:  Not tested  CURB:  Not tested  STAIRS: Not tested GAIT: Findings: Gait Characteristics: step to pattern, decreased arm swing- Right, decreased arm swing- Left, decreased step length- Right, decreased step length- Left, decreased stance time- Right, decreased stance time- Left, decreased stride length, decreased hip/knee flexion- Right, decreased hip/knee flexion- Left, decreased ankle dorsiflexion- Right, decreased ankle dorsiflexion- Left, wide BOS, poor foot clearance- Right, and poor foot clearance- Left, Distance walked: 60 feet, Assistive device  utilized:Walker - 2 wheeled, Level  of assistance: CGA, and Comments:    FUNCTIONAL TESTS:  5 times sit to stand: 25.20 sec with LUE Support Timed up and go (TUG): 40 sec with RW (increased time to place right hand on walker)  6 minute walk test: 482' 10 meter walk test: 0.65 m/s Berg Balance Scale: 42/56    PATIENT SURVEYS:  Stroke Impact Scale - 65.63%                                                                                                                             TREATMENT DATE: 12/26/23    TA:   The patient completed 5 minutes at level(s) 1-3 on [employer] using both BUE/BLE reciprocal movements to promote strength, endurance, and cardiorespiratory fitness. PT increased the resistance level and monitored the patient's response to the intervention throughout. The patient required min cueing for technique and modA level of assistance to get the R UE onto the handle of the machine.  Sit to stand without UE support with airex pad in seat 2x5 with VC for leaning forward. Patient reported some B knee pain  STS with minA and no Airex pad under the LE's, 2x5  Resistive gait using 2.5# AW  with SPC, CGAx 160 feet x2 - Short reciprocal steps and approx 2/3 way in on first lap - patient complaining of increasing B knee pain. Pt responded better on the second lap with improved step length and not speaking about knee pain  Seated LAQ with 2.5# AW donned, 2x10 each LE Seated marches with 2.5# AW donned, 2x10 each LE  Seated exercises performed in order to improve quad strength necessary for eccentric lowering to chair and marches performed to improve hip flexor endurance for proper gait mechanics     PATIENT EDUCATION: Education details: PT plan of care; Discussion of symptoms of CVA and to go to ED as soon as possible. Purpose of PT for balance, strength, coordination and endurance.  Person educated: Patient Education method: Explanation Education comprehension: verbalized understanding  HOME EXERCISE  PROGRAM: Access Code: [email or web address] URL: [email or web address] Date: 12/17/2023 Prepared by: Sidra Simpers  Program Notes **Be sure to perform all exercises next to a countertop or sturdy piece of furniture in case you become unsteady.  Exercises - Standing Heel Raise with Support  - 1 x daily - 7 x weekly - 2 sets - 15 reps - Standing Toe Raises at Chair  - 1 x daily - 7 x weekly - 2 sets - 15 reps - Mini Squat with Counter Support  - 1 x daily - 7 x weekly - 2 sets - 15 reps - Lunge with Counter Support  - 1 x daily - 7 x weekly - 2 sets - 15 reps - Standing Tandem Balance with Counter Support  - 1 x daily - 7 x weekly - 2 sets - 2 reps - 30 hold - Standing Single Leg Stance with Counter Support  -  1 x daily - 7 x weekly - 2 sets - 2 reps - 30 hold  GOALS: Goals reviewed with patient? Yes  SHORT TERM GOALS: Target date: 01/21/2024  Pt will be independent with HEP in order to improve strength and balance in order to decrease fall risk and improve function at home.  Baseline: EVAL - No current formal HEP in place Goal status: INITIAL   LONG TERM GOALS: Target date: 03/03/2024  1.  Patient will complete five times sit to stand test in < 15 seconds indicating an increased LE strength and improved balance. Baseline: EVAL= 25.20 sec with LUE Support Goal status: INITIAL   2.  Patient will increase Berg Balance score by > 6 points to demonstrate decreased fall risk during functional activities. Baseline: EVAL: To be assessed next visit; 12/12/2023= 42/56 Goal status: INITIAL   3.  Patient will reduce timed up and go to <11 seconds to reduce fall risk and demonstrate improved transfer/gait ability. Baseline: EVAL = 40 sec with RW Goal status: INITIAL  4.  Patient will increase 10 meter walk test to >1.70m/s as to improve gait speed for better community ambulation and to reduce fall risk. Baseline: EVAL: 0.65 m/s Goal status: INITIAL  5. Patient will increase six minute  walk test distance by 200 feet for progression to community ambulator and improve gait ability Baseline: EVAL= 482' Goal status: INITIAL   ASSESSMENT:  CLINICAL IMPRESSION:  Pt performed well with tasks, however was fatigued after ambulation attempts.  Pt ultimately performed well and benefited from the increased reps and resistance that were applied during the session.  Pt ultimately still lacks the endurance levels necessary for community level ambulation and will continue to benefit from skilled therapy approaches to target the muscle conditioning for proper gait.   Pt will continue to benefit from skilled therapy to address remaining deficits in order to improve overall QoL and return to PLOF.       OBJECTIVE IMPAIRMENTS: Abnormal gait, decreased activity tolerance, decreased balance, decreased coordination, decreased endurance, decreased mobility, difficulty walking, decreased ROM, decreased strength, impaired UE functional use, and pain.   ACTIVITY LIMITATIONS: carrying, lifting, bending, sitting, standing, squatting, sleeping, stairs, and transfers  PARTICIPATION LIMITATIONS: meal prep, cleaning, laundry, driving, shopping, community activity, and yard work  PERSONAL FACTORS: 1-2 comorbidities: OA, previous CVA with significant R sided weakness are also affecting patient's functional outcome.   REHAB POTENTIAL: Good  CLINICAL DECISION MAKING: Stable/uncomplicated  EVALUATION COMPLEXITY: Moderate  PLAN:  PT FREQUENCY: 1-2x/week  PT DURATION: 12 weeks  PLANNED INTERVENTIONS: 97164- PT Re-evaluation, 97750- Physical Performance Testing, 97110-Therapeutic exercises, 97530- Therapeutic activity, [id number]- Neuromuscular re-education, 97535- Self Care, [id number]- Manual therapy, [id number]- Gait training, [id number]- Orthotic Initial, [phone]- Orthotic/Prosthetic subsequent, [phone]- Canalith repositioning, [id number]- Electrical stimulation (manual), [phone] (1-2 muscles), [id number] (3+ muscles)- Dry Needling,  Patient/Family education, Balance training, Stair training, Taping, Joint mobilization, Joint manipulation, Spinal manipulation, Spinal mobilization, Compression bandaging, Vestibular training, DME instructions, Cryotherapy, and Moist heat  PLAN FOR NEXT SESSION:   Continue with LE strengthening, transfer safety, gait training with appropriate AD Assess HEP compliance and progress as appropriate.   Fonda Simpers, PT, DPT Physical Therapist - [facility]  12/26/23, 5:53 PM

## 2023-12-31 ENCOUNTER — Ambulatory Visit: Admitting: Physical Therapy

## 2023-12-31 ENCOUNTER — Ambulatory Visit

## 2023-12-31 DIAGNOSIS — R262 Difficulty in walking, not elsewhere classified: Secondary | ICD-10-CM

## 2023-12-31 DIAGNOSIS — R269 Unspecified abnormalities of gait and mobility: Secondary | ICD-10-CM

## 2023-12-31 DIAGNOSIS — R278 Other lack of coordination: Secondary | ICD-10-CM

## 2023-12-31 DIAGNOSIS — R296 Repeated falls: Secondary | ICD-10-CM

## 2023-12-31 DIAGNOSIS — M6281 Muscle weakness (generalized): Secondary | ICD-10-CM | POA: Diagnosis not present

## 2023-12-31 DIAGNOSIS — R2689 Other abnormalities of gait and mobility: Secondary | ICD-10-CM

## 2023-12-31 NOTE — Therapy (Signed)
 OUTPATIENT PHYSICAL THERAPY NEURO TREATMENT   Patient Name: Erika Stanton MRN: [id number] DOB:[birth year], 68 y.o., female Today's Date: 12/31/2023   PCP: Dr. Oneil Galloway  REFERRING PROVIDER: Dr. Oneil Galloway  END OF SESSION:  PT End of Session - 12/31/23 1414     Visit Number 7    Number of Visits 24    Date for PT Re-Evaluation 03/03/24    Progress Note Due on Visit 10    PT Start Time 0212    PT Stop Time 0245    PT Time Calculation (min) 33 min    Equipment Utilized During Treatment Gait belt    Activity Tolerance Patient limited by pain    Behavior During Therapy WFL for tasks assessed/performed          Past Medical History:  Diagnosis Date   Anatomical narrow angle, bilateral    Aneurysm (HCC)    Asthma    Cerebral aneurysm    Cerebral vasospasm    Cognitive change    Dysphagia, post-stroke    GERD (gastroesophageal reflux disease)    Hemorrhoids    History of cervical dysplasia    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 no recurrent polyps.  suggest repeat surveillance study 11/2022.     COLONOSCOPY W/ POLYPECTOMY  06/2014   Dr Elder at [facility].  3 adenomatoous polyps, anal fissure.     ESOPHAGOGASTRODUODENOSCOPY (EGD) WITH PROPOFOL  N/A 01/27/2020   Procedure: ESOPHAGOGASTRODUODENOSCOPY (EGD) WITH PROPOFOL ;  Surgeon: Shila Gustav GAILS, MD;  Location: [facility];  Service: Endoscopy;  Laterality: N/A;   INSERTION OF PERMANENT INTRAPERITONEAL CANNULA/CATH N/A    LAPAROSCOPIC   IR 3D INDEPENDENT WKST  01/06/2020   IR ANGIO  INTRA EXTRACRAN SEL INTERNAL CAROTID BILAT MOD SED  01/06/2020   IR ANGIO VERTEBRAL SEL VERTEBRAL UNI L MOD SED  01/06/2020   IR ANGIOGRAM FOLLOW UP STUDY  01/06/2020   IR ANGIOGRAM FOLLOW UP STUDY  01/06/2020   IR ANGIOGRAM FOLLOW UP STUDY  01/06/2020   IR ANGIOGRAM FOLLOW UP STUDY  01/06/2020   IR ANGIOGRAM FOLLOW UP STUDY  01/06/2020   IR ANGIOGRAM FOLLOW UP STUDY  01/06/2020   IR ANGIOGRAM FOLLOW UP STUDY  01/06/2020   IR ANGIOGRAM FOLLOW UP STUDY  01/06/2020   IR ANGIOGRAM FOLLOW UP STUDY  01/06/2020   IR ANGIOGRAM FOLLOW UP STUDY  01/06/2020   IR NEURO EACH ADD'L AFTER BASIC UNI RIGHT (MS)  01/06/2020   IR TRANSCATH/EMBOLIZ  01/06/2020   MASTECTOMY     MOHS SURGERY N/A    RADIOLOGY WITH ANESTHESIA N/A 01/06/2020   Procedure: IR WITH ANESTHESIA FOR ANEURYSM;  Surgeon: Lanis Pupa, MD;  Location: [facility] OR;  Service: Radiology;  Laterality: N/A;   STENT SUPPORTED EMBOLIZATION OF RIGHT ICA ANEURYSM Right    TUBAL LIGATION     VENTRICULO-PERITONEAL SHUNT PLACEMENT / LAPAROSCOPIC INSERTION PERITONEAL CATHETER N/A    Patient Active Problem List   Diagnosis Date Noted   Dyspareunia in  female 04/05/2020   History of abnormal cervical Pap smear 03/10/2020   GIB (gastrointestinal bleeding) 02/28/2020   Elevated BUN    Prediabetes    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   Cerebral vasospasm    Sinus tachycardia    Dysphagia, post-stroke    Thrombocytopenia (HCC)    Acute blood loss anemia    Brain aneurysm    ICH (intracerebral hemorrhage) (HCC) 01/05/2020   History of adenomatous polyp of colon 02/07/2016   Anatomical narrow angle, bilateral 12/14/2014   Fissure in ano 11/11/2014   Hemorrhoids 11/11/2014   Constipation 11/19/2013   GERD (gastroesophageal reflux disease) 03/24/2013   Irritable bowel syndrome with diarrhea 03/24/2013   Herpes zoster 05/14/2005   Abnormal Pap smear of cervix 05/15/1983   History of cervical dysplasia 05/15/1983    ONSET DATE:  12/02/2023  REFERRING DIAG: I63.9 (ICD-10-CM) - CVA (cerebral vascular accident) (HCC)   THERAPY DIAG:  Other lack of coordination  Muscle weakness (generalized)  Abnormality of gait and mobility  Difficulty in walking, not elsewhere classified  Other abnormalities of gait and mobility  Repeated falls  Rationale for Evaluation and Treatment: Rehabilitation  SUBJECTIVE:                                                                                                                                                                                             SUBJECTIVE STATEMENT:   Pt arrives lat to PT. Pt reports that she is sad today. States that she had spoken to Neurologist that cleared her to have knee surgery potentially 3-6 months following CVA. Spoke with surgical team this morning, and was told that she has to wait at least 9 months from anaesthesia before surgy con be scheduled.     From EVAL:Patient reports she was in hospital from [facility]- Sat (July 21st- 25th) with CVA affecting left side. I was supposed to have a knee replacement surgery in September but that is on hold for now. I was doing my prep for my colonoscopy and fell in the bathroom.  Patient reports having mild left sided weakness compared to her CVA in 2021 with heavy Right sided UE weakness. She reports bad OA affecting both knees as well.    Pt accompanied by: Husband brought patient  PERTINENT HISTORY: Previous CVA in 2021 affecting Right side. Reports going to ED  last Monday due to fall/CVA affecting Left side. Diagnosed with R CVA with Left Sided weakness.    PAIN:  Are you having pain? Yes: NPRS scale: Current = 1/10; worst= 6/10 Pain location: Bilateral knees- Arthritis  Pain description: achy yet sharp with weight  bearing Aggravating factors: Increased walking, transfers Relieving factors: rest  PRECAUTIONS: Fall  RED FLAGS: None   WEIGHT BEARING RESTRICTIONS: No  FALLS: Has patient fallen in  last 6 months? Yes. Number of falls fell with the stroke  LIVING ENVIRONMENT: Lives with: lives with their spouse Lives in: House/apartment Stairs: Yes: External: 2 steps; on left going up Has following equipment at home: Single point cane, Walker - 2 wheeled, shower chair, and Grab bars  PLOF: Independent- Retired from [employer]  PATIENT GOALS: Walk without any assistive device.  OBJECTIVE:  Note: Objective measures were completed at Evaluation unless otherwise noted.  DIAGNOSTIC FINDINGS: MRI BRAIN WITHOUT CONTRAST  INDICATION: Neuro deficit, acute, stroke suspected, left sided facial weakness, I63.9 Cerebral infarction, unspecified (CMS/HHS-HCC)  COMPARISON: December 02, 2023 CTA head and neck  TECHNIQUE/PROTOCOL: Standard adult protocol brain MR without contrast.  FINDINGS: Brain Parenchyma: Multifocal areas of restricted diffusion within the right cerebral hemisphere; for example, within the right periventricular white matter (series 6 image 47), right posterior temporal lobe (series 6 image 44), inferior right temporal lobe (series 6 image 41), and right parietal lobe (series 6 image 50). No midline shift. Susceptibility artifact from left frontal approach ventriculostomy shunt catheter obscures a majority of the left hemisphere and susceptibility artifact from right ICA terminus coils portions of the right temporal lobe. Encephalomalacia around the coil pack and left frontal parietal lobe, unchanged. Area of FLAIR hyperintensity in the right cerebellar peduncle, likely representing wallerian degeneration. Scattered areas of increased T2/FLAIR signal intensity in the periventricular white matter, non-specific but likely the sequela of chronic small vessel ischemic disease. Ventricles and Sulci: Stable appearance of shunted ventricular system with left frontal approach ventriculostomy catheter with tip terminating near the septum pellucidum. Extra-Axial Spaces: No extra-axial  fluid collection. Basal Cisterns: Normal. Intracranial Flow-Voids: Normal.  Paranasal Sinuses: Non-opacified. Mastoids: Normal. Orbits: Normal. Cranium: Multiple burr holes. Visualized upper cervical spine: No high grade stenosis.   IMPRESSION: 1. Multifocal acute infarcts within the right MCA territory, as above. Distribution may reflect thromboembolic etiology from the more proximal right MCA. 2. Please note that considerable susceptibility artifact from left frontal approach ventriculostomy shunt catheter obscures a majority of the left hemisphere limiting evaluation and precluding assessment for infarct in these locations.  Findings were discussed communicated via Epic secure chat with Devora MD by Lang Payment, MD at 12/03/2023 12:50 AM.  The preliminary report (critical or emergent communication) was reviewed prior to this dictation and there are no substantial differences between the preliminary results and the impressions in this final report.  Electronically Reviewed by:  Metta Fruits, MD, [facility] Electronically Reviewed on:  12/03/2023 11:11 AM  I have reviewed the images and concur with the above findings.  Electronically Signed by:  Evalene Bigger, MD, [facility] Electronically Signed on:  12/03/2023 5:30 PM Procedure Note  Amrhein, Evalene Agent, MD - 12/03/2023 Formatting of this note might be different from the original. MRI BRAIN WITHOUT CONTRAST  INDICATION: Neuro deficit, acute, stroke suspected, left sided facial weakness, I63.9 Cerebral infarction, unspecified (CMS/HHS-HCC)  COMPARISON: December 02, 2023 CTA head and neck  TECHNIQUE/PROTOCOL: Standard adult protocol brain MR without contrast.  FINDINGS: Brain Parenchyma: Multifocal areas of restricted diffusion within the right cerebral hemisphere; for example, within the right periventricular white matter (series 6 image 47), right posterior temporal lobe (series 6 image 44), inferior right  temporal lobe (series 6 image 41), and right parietal lobe (series 6 image 50). No midline shift. Susceptibility artifact from left frontal  approach ventriculostomy shunt catheter obscures a majority of the left hemisphere and susceptibility artifact from right ICA terminus coils portions of the right temporal lobe. Encephalomalacia around the coil pack and left frontal parietal lobe, unchanged. Area of FLAIR hyperintensity in the right cerebellar peduncle, likely representing wallerian degeneration. Scattered areas of increased T2/FLAIR signal intensity in the periventricular white matter, non-specific but likely the sequela of chronic small vessel ischemic disease. Ventricles and Sulci: Stable appearance of shunted ventricular system with left frontal approach ventriculostomy catheter with tip terminating near the septum pellucidum. Extra-Axial Spaces: No extra-axial fluid collection. Basal Cisterns: Normal. Intracranial Flow-Voids: Normal.  Paranasal Sinuses: Non-opacified. Mastoids: Normal. Orbits: Normal. Cranium: Multiple burr holes. Visualized upper cervical spine: No high grade stenosis.   IMPRESSION: 1. Multifocal acute infarcts within the right MCA territory, as above. Distribution may reflect thromboembolic etiology from the more proximal right MCA. 2. Please note that considerable susceptibility artifact from left frontal approach ventriculostomy shunt catheter obscures a majority of the left hemisphere limiting evaluation and precluding assessment for infarct in these locations.  Findings were discussed communicated via Epic secure chat with Devora MD by Lang Payment, MD at 12/03/2023 12:50 AM.  The preliminary report (critical or emergent communication) was reviewed prior to this dictation and there are no substantial differences between the preliminary results and the impressions in this final report.  Electronically Reviewed by:  Metta Fruits, MD, [facility] Electronically Reviewed on:  12/03/2023 11:11 AM  I have reviewed the images and concur with the above findings.  Electronically Signed by:  Evalene Bigger, MD, [facility] Electronically Signed on:  12/03/2023 5:30 PM Exam End: 12/03/23 00:10   Specimen Collected: 12/02/23 Last Resulted: 12/03/23 17:30  Received From: [facility]  Result Received: 12/09/23 16:07     COGNITION: Overall cognitive status: Within functional limits for tasks assessed   SENSATION: WFL  COORDINATION: Impaired - delayed each LE  EDEMA:  None observed   POSTURE: rounded shoulders and forward head  LOWER EXTREMITY ROM:     Active  Right Eval Left Eval  Hip flexion    Hip extension    Hip abduction    Hip adduction    Hip internal rotation    Hip external rotation    Knee flexion    Knee extension    Ankle dorsiflexion    Ankle plantarflexion    Ankle inversion    Ankle eversion     (Blank rows = not tested)  LOWER EXTREMITY MMT:    MMT Right Eval Left Eval  Hip flexion 4 4  Hip extension    Hip abduction    Hip adduction    Hip internal rotation 2- 2-  Hip external rotation 3+ 3+  Knee flexion 3+ 3+  Knee extension 4 4  Ankle dorsiflexion 4 4  Ankle plantarflexion    Ankle inversion    Ankle eversion    (Blank rows = not tested)  BED MOBILITY:  Not tested  TRANSFERS: Sit to stand: CGA  Assistive device utilized: Environmental consultant - 2 wheeled     Stand to sit: CGA  Assistive device utilized: Environmental consultant - 2 wheeled      RAMP:  Not tested  CURB:  Not tested  STAIRS: Not tested GAIT: Findings: Gait Characteristics: step to pattern, decreased arm swing- Right, decreased arm swing- Left, decreased step length- Right, decreased step length- Left, decreased stance time- Right, decreased stance time- Left, decreased stride length, decreased hip/knee flexion- Right, decreased hip/knee  flexion- Left, decreased ankle dorsiflexion- Right, decreased ankle  dorsiflexion- Left, wide BOS, poor foot clearance- Right, and poor foot clearance- Left, Distance walked: 60 feet, Assistive device utilized:Walker - 2 wheeled, Level of assistance: CGA, and Comments:    FUNCTIONAL TESTS:  5 times sit to stand: 25.20 sec with LUE Support Timed up and go (TUG): 40 sec with RW (increased time to place right hand on walker)  6 minute walk test: 482' 10 meter walk test: 0.65 m/s Berg Balance Scale: 42/56    PATIENT SURVEYS:  Stroke Impact Scale - 65.63%                                                                                                                             TREATMENT DATE: 12/31/23  TE  LAQ AROM x 12  Hip abduction RTB  x 12  Hip flexion RTB x 12 bil  Sit<>stand for improved BLE strength with 1 UE support 2 x 8   NMR Standing on airex pad  Normal BOS 2 x 30 sec  Narrow BOS 2 x 30 sec  Normal BOS head turns x 10 bil  Head nods x 10 bil   Reciprocal foot taps on 6 inch step x 8 bil with cues for posture and gluteal activation   Gait with SPC to WC x 42ft with supervision assist no significant antalgia noted.     Seated exercises performed in order to improve quad strength necessary for eccentric lowering to chair and marches performed to improve hip flexor endurance for proper gait mechanics     PATIENT EDUCATION: Education details: PT plan of care; Discussion of symptoms of CVA and to go to ED as soon as possible. Purpose of PT for balance, strength, coordination and endurance.  Person educated: Patient Education method: Explanation Education comprehension: verbalized understanding  HOME EXERCISE PROGRAM: Access Code: [email or web address] URL: [email or web address] Date: 12/17/2023 Prepared by: Sidra Simpers  Program Notes **Be sure to perform all exercises next to a countertop or sturdy piece of furniture in case you become unsteady.  Exercises - Standing Heel Raise with Support  - 1 x daily - 7 x weekly - 2 sets  - 15 reps - Standing Toe Raises at Chair  - 1 x daily - 7 x weekly - 2 sets - 15 reps - Mini Squat with Counter Support  - 1 x daily - 7 x weekly - 2 sets - 15 reps - Lunge with Counter Support  - 1 x daily - 7 x weekly - 2 sets - 15 reps - Standing Tandem Balance with Counter Support  - 1 x daily - 7 x weekly - 2 sets - 2 reps - 30 hold - Standing Single Leg Stance with Counter Support  - 1 x daily - 7 x weekly - 2 sets - 2 reps - 30 hold  GOALS: Goals reviewed with patient? Yes  SHORT TERM GOALS: Target date: 01/21/2024  Pt will be independent  with HEP in order to improve strength and balance in order to decrease fall risk and improve function at home.  Baseline: EVAL - No current formal HEP in place Goal status: INITIAL   LONG TERM GOALS: Target date: 03/03/2024  1.  Patient will complete five times sit to stand test in < 15 seconds indicating an increased LE strength and improved balance. Baseline: EVAL= 25.20 sec with LUE Support Goal status: INITIAL   2.  Patient will increase Berg Balance score by > 6 points to demonstrate decreased fall risk during functional activities. Baseline: EVAL: To be assessed next visit; 12/12/2023= 42/56 Goal status: INITIAL   3.  Patient will reduce timed up and go to <11 seconds to reduce fall risk and demonstrate improved transfer/gait ability. Baseline: EVAL = 40 sec with RW Goal status: INITIAL  4.  Patient will increase 10 meter walk test to >1.32m/s as to improve gait speed for better community ambulation and to reduce fall risk. Baseline: EVAL: 0.65 m/s Goal status: INITIAL  5. Patient will increase six minute walk test distance by 200 feet for progression to community ambulator and improve gait ability Baseline: EVAL= 482' Goal status: INITIAL   ASSESSMENT:  CLINICAL IMPRESSION:  Pt performed well with tasks,but late arrival limits PT treatment. Due to knee pain this PM but requests to minimize gait on this day. Treatment focused on  BLE strength and static/dynamic balance on unlevel surface. Noted to have  cavitation on BLE when stepping on/off airex pad. Pt reports feeling lateral LOB to the R, but no overt falls seen by PT. Pt will continue to benefit from skilled therapy to address remaining deficits in order to improve overall QoL and return to PLOF.       OBJECTIVE IMPAIRMENTS: Abnormal gait, decreased activity tolerance, decreased balance, decreased coordination, decreased endurance, decreased mobility, difficulty walking, decreased ROM, decreased strength, impaired UE functional use, and pain.   ACTIVITY LIMITATIONS: carrying, lifting, bending, sitting, standing, squatting, sleeping, stairs, and transfers  PARTICIPATION LIMITATIONS: meal prep, cleaning, laundry, driving, shopping, community activity, and yard work  PERSONAL FACTORS: 1-2 comorbidities: OA, previous CVA with significant R sided weakness are also affecting patient's functional outcome.   REHAB POTENTIAL: Good  CLINICAL DECISION MAKING: Stable/uncomplicated  EVALUATION COMPLEXITY: Moderate  PLAN:  PT FREQUENCY: 1-2x/week  PT DURATION: 12 weeks  PLANNED INTERVENTIONS: 97164- PT Re-evaluation, 97750- Physical Performance Testing, 97110-Therapeutic exercises, 97530- Therapeutic activity, [id number]- Neuromuscular re-education, 97535- Self Care, [id number]- Manual therapy, [id number]- Gait training, [id number]- Orthotic Initial, [phone]- Orthotic/Prosthetic subsequent, [phone]- Canalith repositioning, [id number]- Electrical stimulation (manual), [phone] (1-2 muscles), [id number] (3+ muscles)- Dry Needling, Patient/Family education, Balance training, Stair training, Taping, Joint mobilization, Joint manipulation, Spinal manipulation, Spinal mobilization, Compression bandaging, Vestibular training, DME instructions, Cryotherapy, and Moist heat  PLAN FOR NEXT SESSION:   Continue with LE strengthening, transfer safety, gait training with appropriate AD Assess HEP compliance and progress  as appropriate.    Massie Dollar PT, DPT  Physical Therapist - [facility]  2:15 PM 12/31/23

## 2023-12-31 NOTE — Therapy (Unsigned)
 OUTPATIENT OCCUPATIONAL THERAPY NEURO TREATMENT NOTE  Patient Name: Erika Stanton MRN: [id number] DOB:26-Apr-1956, 68 y.o., female Today's Date: 01/02/2024  PCP: Laurice Anes, MD REFERRING PROVIDER: Laurice Anes, MD   OT End of Session - 01/02/24 1406     Visit Number 5    Number of Visits 24    Date for OT Re-Evaluation 03/03/24    OT Start Time 1445    OT Stop Time 1530    OT Time Calculation (min) 45 min    Activity Tolerance Patient tolerated treatment well    Behavior During Therapy WFL for tasks assessed/performed         Past Medical History:  Diagnosis Date   Anatomical narrow angle, bilateral    Aneurysm (HCC)    Asthma    Cerebral aneurysm    Cerebral vasospasm    Cognitive change    Dysphagia, post-stroke    GERD (gastroesophageal reflux disease)    Hemorrhoids    History of cervical dysplasia    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 no recurrent polyps.  suggest repeat surveillance study 11/2022.     COLONOSCOPY W/ POLYPECTOMY  06/2014   Dr Elder at [facility].  3 adenomatoous polyps, anal fissure.     ESOPHAGOGASTRODUODENOSCOPY (EGD) WITH PROPOFOL  N/A 01/27/2020   Procedure: ESOPHAGOGASTRODUODENOSCOPY (EGD) WITH PROPOFOL ;  Surgeon: Shila Gustav GAILS, MD;  Location: [facility] ENDOSCOPY;  Service: Endoscopy;  Laterality: N/A;   INSERTION OF PERMANENT INTRAPERITONEAL CANNULA/CATH N/A    LAPAROSCOPIC   IR 3D INDEPENDENT WKST  01/06/2020   IR ANGIO INTRA EXTRACRAN SEL INTERNAL CAROTID BILAT MOD SED  01/06/2020   IR ANGIO VERTEBRAL SEL  VERTEBRAL UNI L MOD SED  01/06/2020   IR ANGIOGRAM FOLLOW UP STUDY  01/06/2020   IR ANGIOGRAM FOLLOW UP STUDY  01/06/2020   IR ANGIOGRAM FOLLOW UP STUDY  01/06/2020   IR ANGIOGRAM FOLLOW UP STUDY  01/06/2020   IR ANGIOGRAM FOLLOW UP STUDY  01/06/2020   IR ANGIOGRAM FOLLOW UP STUDY  01/06/2020   IR ANGIOGRAM FOLLOW UP STUDY  01/06/2020   IR ANGIOGRAM FOLLOW UP STUDY  01/06/2020   IR ANGIOGRAM FOLLOW UP STUDY  01/06/2020   IR ANGIOGRAM FOLLOW UP STUDY  01/06/2020   IR NEURO EACH ADD'L AFTER BASIC UNI RIGHT (MS)  01/06/2020   IR TRANSCATH/EMBOLIZ  01/06/2020   MASTECTOMY     MOHS SURGERY N/A    RADIOLOGY WITH ANESTHESIA N/A 01/06/2020   Procedure: IR WITH ANESTHESIA FOR ANEURYSM;  Surgeon: Lanis Pupa, MD;  Location: [facility] OR;  Service: Radiology;  Laterality: N/A;   STENT SUPPORTED EMBOLIZATION OF RIGHT ICA ANEURYSM Right    TUBAL LIGATION     VENTRICULO-PERITONEAL SHUNT PLACEMENT / LAPAROSCOPIC INSERTION PERITONEAL CATHETER N/A    Patient Active Problem List   Diagnosis Date Noted   Dyspareunia in female 04/05/2020   History of abnormal cervical Pap smear 03/10/2020   GIB (gastrointestinal bleeding) 02/28/2020   Elevated BUN  Prediabetes    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   Cerebral vasospasm    Sinus tachycardia    Dysphagia, post-stroke    Thrombocytopenia (HCC)    Acute blood loss anemia    Brain aneurysm    ICH (intracerebral hemorrhage) (HCC) 01/05/2020   History of adenomatous polyp of colon 02/07/2016   Anatomical narrow angle, bilateral 12/14/2014   Fissure in ano 11/11/2014   Hemorrhoids 11/11/2014   Constipation 11/19/2013   GERD (gastroesophageal reflux disease) 03/24/2013   Irritable bowel syndrome with diarrhea 03/24/2013   Herpes zoster 05/14/2005   Abnormal Pap smear of cervix 05/15/1983   History of cervical dysplasia 05/15/1983   ONSET DATE: 12/02/2023  REFERRING DIAG: L ischemic CVA  THERAPY DIAG:  Muscle weakness  (generalized)  Other lack of coordination  Rationale for Evaluation and Treatment: Rehabilitation  SUBJECTIVE:  SUBJECTIVE STATEMENT: Pt reports she's been having difficulties plugging in her ipad charger and water  flosser charger. Pt accompanied by: self  PERTINENT HISTORY:  Pt. Was admitted to [facility] from 12/02/23-12/06/23 with an Acute Right MCA CVA, Hydrocephalus with shunt  placement. History of Subarachnoid Hemorrhage with bilateral ICA coiled aneurysms. 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  PRECAUTIONS: None  WEIGHT BEARING RESTRICTIONS: No  PAIN: 12/31/23: 4/10 pain bilat knees  Are you having pain? No  FALLS: Has patient fallen in last 6 months? Yes. Number of falls 1  LIVING ENVIRONMENT: Lives with: lives with their family and lives with their spouse Lives in: House/apartment Stairs: hand rails, 2 steps   Has following equipment at home: [name], cane  PLOF: Independent with basic ADLs, Spouse help as needed  PATIENT GOALS: Strengthening  OBJECTIVE:  Note: Objective measures were completed at Evaluation unless otherwise noted.  HAND DOMINANCE: Left  ADLs: Overall ADLs: spouse assists when needed, Pt. reports that she can do most tasks herself. Uses her left hand to engage in self-care tasks. Eating: Able to cut foods with a fork, husband assists as needed.  Grooming: husband assists with applying toothpaste, able to brush teeth and hair UB Dressing: Spouse assists with applying/fastening bra, Pt. Requires assistance with zippers, and buttons. LB Dressing: Spouse assists with tying shoes when needed, uses slip on shoes. Pt. Does not zip pants. Toileting: Able to complete  independently Bathing: Pt. Requires intermittent assistance with bathing tasks. Tub Shower transfers: Spouse assists with transfers when/if needed Equipment: Shower seat with back  IADLs: Shopping: Spouse typically does the shopping, isnt able to carry heavy objects.  Light housekeeping: Spouse typically does most of the cleaning around the home, Pt. Cleans bathroom, and laundry Pt. Is unable to carry laundry due to exacerbation of weakness.  Meal Prep: Spouse typically cooks and prepares meals.  Community mobility: Pt. Requires assistance using walker. Pt. reports that she is unable to carry heavy items, and typically does not do the shopping.  Medication management: Pt. is able to take care of meds with pillbox Financial management:  Spouse takes care of it, and makes online payments.  Handwriting: 50% legible  MOBILITY STATUS: Needs Assist: Pt. Requires use of a walker   POSTURE COMMENTS:  No Significant postural limitations Sitting balance: Good  ACTIVITY TOLERANCE: Activity tolerance:   FUNCTIONAL OUTCOME MEASURES: TBD   UPPER EXTREMITY ROM:  Active ROM Right eval Left eval  Shoulder flexion 94(121) 102(125)  Shoulder abduction 75(106) 110(126)  Shoulder adduction    Shoulder extension    Shoulder internal rotation    Shoulder external rotation    Elbow flexion 135(155) 140(150)  Elbow extension 0 -10(-9)  Wrist flexion 40(44) 42(58)  Wrist extension 30(34) 40(50)  Wrist ulnar deviation    Wrist radial deviation    Wrist pronation    Wrist supination    (Blank rows = not tested)  UPPER EXTREMITY MMT:     MMT Right eval Left eval  Shoulder flexion 3-/5 3-/5  Shoulder abduction 3-/5 3-/5  Shoulder adduction    Shoulder extension    Shoulder internal rotation    Shoulder external rotation    Middle trapezius    Lower trapezius    Elbow flexion 4-/5 4+/5  Elbow extension 4-/5 4+/5  Wrist flexion 4-/5 3+/5  Wrist extension 4-/5 3+/5  Wrist ulnar  deviation    Wrist radial deviation    Wrist pronation    Wrist supination    (Blank rows = not tested)  HAND FUNCTION: Grip strength: Right: N/T lbs; Left: 43 lbs, Lateral pinch: Right: N/T lbs, Left: 9 lbs, and 3 point pinch: Right: N/T lbs, Left: 5 lbs Pt. Presents with flexor tightness in the right 5th digit, however is able to passively extend 5th digit within normal range. Pt. Also Presents with hyper extension in the 2nd and 3rd digit at PIP joints. Pt. Is able to achieve full Digit MP, PIP, and DIP PROM.  COORDINATION: 9 Hole Peg test: Right: N/T sec; Left: 51 sec  SENSATION: Not tested  EDEMA:   MUSCLE TONE: Fluctuating flexor tone in R hand.  COGNITION: Overall cognitive status: Within functional limits for tasks assessed  VISION: Subjective report:  Baseline vision:  Visual history:   VISION ASSESSMENT:  PERCEPTION:   PRAXIS:   OBSERVATIONS: Pt. Presents with fluctuating flexor tone in the R hand, involving the 5th digit at the PIP/DIP joint. Pt. Presents with hyperextension at the 2nd and 3rd digits at the PIP joints.                                                                                                                    TREATMENT DATE: 12/31/2023 Therapeutic Exercises: -Review of self passive stretching to reduce contracture risk in R hand, including wrist and digit ext and digit abd, mod vc/tactile cues.  Pt inquired about spouse coming in to review stretches as he wants to make sure that he's doing them correctly.  OT encouraged pt that spouse is welcome at any appt for review of above.  Self Care: -Practiced trials of plugging/unplugging various chargers into electronic devices with vc for positioning and engaging the R hand as a stabilizer.  Pt required max vc for lining up charger with device.   Therapeutic Activity: -Participation in placing grooved pegs into pegboards as a carryover task of lining up objects to place into a hole.  Pt  required  mod vc for repositioning peg in hand to line up peg in groove, but was able to decrease to min vc once pegboard was repositioned closer to pt for better viewing (pt was wearing reading glasses).   -Visual/perceptual assessment to ensure pt could reposition a shape as directed by therapist and draw a mirror image of a shape (both accurate), and clock drawing to assess spatial awareness; all appeared grossly intact.     PATIENT EDUCATION: Education details: Review of self PROM to R hand to reduce contracture risk Person educated: Patient Education method: Explanation, Demonstration, Tactile cues, and Verbal cues Education comprehension: verbalized understanding and returned demonstration, further training needed  HOME EXERCISE PROGRAM: Pink theraputty; visual handout issued  GOALS: Goals reviewed with patient? Yes  SHORT TERM GOALS: Target date: 01/21/2024    Pt. Will be independent with HEP for UE functioning. Baseline: Eval:No current HEP Goal status: INITIAL  LONG TERM GOALS: Target date: 03/03/2024    Pt. Will improve [facility] skills to be able to be able to manipulate small objects at home Baseline: 9 hole peg test: L: 51 seconds Goal status: INITIAL  2.  Pt. Will increase BUE shoulder ROM by 10 degrees to be able to reach into cabinets and shelves.  Baseline: Shoulder flexion: R: 94(121), L: 102(125), Shoulder abduction: R: 75(106), L: 110(126) Goal status: INITIAL  3.  Pt. Will improve L grip strength by 5# to be able to securely grasp ADL items at home.  Baseline: Grip strength: R: NT, L: 43# Goal status: INITIAL  4.  Pt. Will increase L lateral key pinch strength by 3#  to open wide mouth jars.  Baseline: Lateral key pinch: R: NT, L: 9# Goal status: INITIAL  5.  Pt. Will independently engage the right hand as a gross assist to the left hand 100% of the time during ADLs/IADLs. Baseline: Eval: Pt. With limited engagement of the right hand as a gross assist during tasks.   Goal status: INITIAL  6.  Pt. Will  improve left hand [facility] skills by 3 sec. of speed to be able to manipulate zippers, and buttons efficiently. Baseline: Eval:Right: NT Left: 51 sec.  Goal status: INITIAL  7. Pt. Will improve handwriting legibility to 100% in printed form for written correspondence.  Baseline: 50% legible in printed form for first name only. Goal status: INITIAL  ASSESSMENT:  CLINICAL IMPRESSION: Good participation in self PROM review to reduce contracture risk in R hand.  Pt asked if spouse could attend a session for a review, which OT encouraged.  OT also encouraged pt bring in her resting hand splint to reassess for appropriate fit.  Focus today on plugging in chargers into electronic devices, as pt reports that she struggles to plug in her devices at home, specifically her ipad and water  flosser.  Pt struggled greatly to line up both types of chargers into holes.  OT questioned whether struggle was d/t FM impairment, vs visual acuity, vs visual perceptual deficit.  Grossly assessed visual perceptual deficits today as noted above, as pt did not appear to understand how to line up and reposition objects to fit necessary holes.  Visual perceptual skills assessed today appeared grossly intact.  Pt did struggle with the [facility] aspect of tasks noted above, but also did seem to struggle to view the holes to plug in the chargers.  OT encouraged pt bring in her water  flosser for breakdown of task components.  Pt agreeable.  May need to determine if lighting  or building up charging cord for better manipulation would be helpful.  Pt. will continue to benefit from OT services to increase BUE ROM in the shoulder, wrist and hands, as well as increasing L grip/pinch strength and improve [facility] skills to be able to perform her desired ADL/IADLs.   PERFORMANCE DEFICITS: in functional skills including ADLs, IADLs, coordination, dexterity, edema, tone, ROM, strength, pain, Fine motor control, Gross motor  control, endurance, and UE functional use, and psychosocial skills including coping strategies, environmental adaptation, habits, and routines and behaviors.   IMPAIRMENTS: are limiting patient from ADLs, IADLs, rest and sleep, leisure, and social participation.   CO-MORBIDITIES: may have co-morbidities  that affects occupational performance. Patient will benefit from skilled OT to address above impairments and improve overall function.  MODIFICATION OR ASSISTANCE TO COMPLETE EVALUATION: Min-Moderate modification of tasks or assist with assess necessary to complete an evaluation.  OT OCCUPATIONAL PROFILE AND HISTORY: Detailed assessment: Review of records and additional review of physical, cognitive, psychosocial history related to current functional performance.  CLINICAL DECISION MAKING: Moderate - several treatment options, min-mod task modification necessary  REHAB POTENTIAL: Good  EVALUATION COMPLEXITY: Moderate    PLAN:  OT FREQUENCY: 2x/week  OT DURATION: 12 weeks  PLANNED INTERVENTIONS: 97168 OT Re-evaluation, 97535 self care/ADL training, 02889 therapeutic exercise, 97530 therapeutic activity, 97112 neuromuscular re-education, 97140 manual therapy, 97018 paraffin, 02960 fluidotherapy, 97010 moist heat, 97010 cryotherapy, 97034 contrast bath, 97032 electrical stimulation (manual), 97760 Orthotic Initial, passive range of motion, energy conservation, coping strategies training, patient/family education, and DME and/or AE instructions  RECOMMENDED OTHER SERVICES: PT   CONSULTED AND AGREED WITH PLAN OF CARE: Patient  PLAN FOR NEXT SESSION: Treatment  Inocente Blazing, MS, OTR/L  01/02/2024, 2:08 PM

## 2024-01-02 ENCOUNTER — Ambulatory Visit

## 2024-01-02 ENCOUNTER — Ambulatory Visit: Admitting: Occupational Therapy

## 2024-01-02 DIAGNOSIS — M6281 Muscle weakness (generalized): Secondary | ICD-10-CM

## 2024-01-02 DIAGNOSIS — R2689 Other abnormalities of gait and mobility: Secondary | ICD-10-CM

## 2024-01-02 DIAGNOSIS — R278 Other lack of coordination: Secondary | ICD-10-CM

## 2024-01-02 DIAGNOSIS — R269 Unspecified abnormalities of gait and mobility: Secondary | ICD-10-CM

## 2024-01-02 DIAGNOSIS — R262 Difficulty in walking, not elsewhere classified: Secondary | ICD-10-CM

## 2024-01-02 DIAGNOSIS — R296 Repeated falls: Secondary | ICD-10-CM

## 2024-01-02 NOTE — Therapy (Signed)
 OUTPATIENT PHYSICAL THERAPY NEURO TREATMENT   Patient Name: Erika Stanton MRN: [id number] DOB:01-28-1956, 68 y.o., female Today's Date: 01/02/2024   PCP: Dr. Oneil Galloway  REFERRING PROVIDER: Dr. Oneil Galloway  END OF SESSION:  PT End of Session - 01/02/24 1412     Visit Number 8    Number of Visits 24    Date for PT Re-Evaluation 03/03/24    Progress Note Due on Visit 10    PT Start Time 1406    PT Stop Time 1445    PT Time Calculation (min) 39 min    Equipment Utilized During Treatment Gait belt    Activity Tolerance Patient limited by pain    Behavior During Therapy WFL for tasks assessed/performed          Past Medical History:  Diagnosis Date   Anatomical narrow angle, bilateral    Aneurysm (HCC)    Asthma    Cerebral aneurysm    Cerebral vasospasm    Cognitive change    Dysphagia, post-stroke    GERD (gastroesophageal reflux disease)    Hemorrhoids    History of cervical dysplasia    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 no recurrent polyps.  suggest repeat surveillance study 11/2022.     COLONOSCOPY W/ POLYPECTOMY  06/2014   Dr Elder at [facility].  3 adenomatoous polyps, anal fissure.     ESOPHAGOGASTRODUODENOSCOPY (EGD) WITH PROPOFOL  N/A 01/27/2020   Procedure: ESOPHAGOGASTRODUODENOSCOPY (EGD) WITH PROPOFOL ;  Surgeon: Shila Gustav GAILS, MD;  Location: [facility] ENDOSCOPY;  Service: Endoscopy;  Laterality: N/A;   INSERTION OF PERMANENT INTRAPERITONEAL CANNULA/CATH N/A    LAPAROSCOPIC   IR 3D INDEPENDENT WKST  01/06/2020   IR ANGIO  INTRA EXTRACRAN SEL INTERNAL CAROTID BILAT MOD SED  01/06/2020   IR ANGIO VERTEBRAL SEL VERTEBRAL UNI L MOD SED  01/06/2020   IR ANGIOGRAM FOLLOW UP STUDY  01/06/2020   IR ANGIOGRAM FOLLOW UP STUDY  01/06/2020   IR ANGIOGRAM FOLLOW UP STUDY  01/06/2020   IR ANGIOGRAM FOLLOW UP STUDY  01/06/2020   IR ANGIOGRAM FOLLOW UP STUDY  01/06/2020   IR ANGIOGRAM FOLLOW UP STUDY  01/06/2020   IR ANGIOGRAM FOLLOW UP STUDY  01/06/2020   IR ANGIOGRAM FOLLOW UP STUDY  01/06/2020   IR ANGIOGRAM FOLLOW UP STUDY  01/06/2020   IR ANGIOGRAM FOLLOW UP STUDY  01/06/2020   IR NEURO EACH ADD'L AFTER BASIC UNI RIGHT (MS)  01/06/2020   IR TRANSCATH/EMBOLIZ  01/06/2020   MASTECTOMY     MOHS SURGERY N/A    RADIOLOGY WITH ANESTHESIA N/A 01/06/2020   Procedure: IR WITH ANESTHESIA FOR ANEURYSM;  Surgeon: Lanis Pupa, MD;  Location: [facility] OR;  Service: Radiology;  Laterality: N/A;   STENT SUPPORTED EMBOLIZATION OF RIGHT ICA ANEURYSM Right    TUBAL LIGATION     VENTRICULO-PERITONEAL SHUNT PLACEMENT / LAPAROSCOPIC INSERTION PERITONEAL CATHETER N/A    Patient Active Problem List   Diagnosis Date Noted   Dyspareunia in  female 04/05/2020   History of abnormal cervical Pap smear 03/10/2020   GIB (gastrointestinal bleeding) 02/28/2020   Elevated BUN    Prediabetes    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   Cerebral vasospasm    Sinus tachycardia    Dysphagia, post-stroke    Thrombocytopenia (HCC)    Acute blood loss anemia    Brain aneurysm    ICH (intracerebral hemorrhage) (HCC) 01/05/2020   History of adenomatous polyp of colon 02/07/2016   Anatomical narrow angle, bilateral 12/14/2014   Fissure in ano 11/11/2014   Hemorrhoids 11/11/2014   Constipation 11/19/2013   GERD (gastroesophageal reflux disease) 03/24/2013   Irritable bowel syndrome with diarrhea 03/24/2013   Herpes zoster 05/14/2005   Abnormal Pap smear of cervix 05/15/1983   History of cervical dysplasia 05/15/1983    ONSET DATE:  12/02/2023  REFERRING DIAG: I63.9 (ICD-10-CM) - CVA (cerebral vascular accident) (HCC)   THERAPY DIAG:  Muscle weakness (generalized)  Other lack of coordination  Abnormality of gait and mobility  Difficulty in walking, not elsewhere classified  Other abnormalities of gait and mobility  Repeated falls  Rationale for Evaluation and Treatment: Rehabilitation  SUBJECTIVE:                                                                                                                                                                                             SUBJECTIVE STATEMENT:   Pt reports pain in the bilateral knees.  Pt continues to report that she is unable to have the surgery on her knees due to the stroke.     From EVAL:Patient reports she was in hospital from [facility]- Sat (July 21st- 25th) with CVA affecting left side. I was supposed to have a knee replacement surgery in September but that is on hold for now. I was doing my prep for my colonoscopy and fell in the bathroom.  Patient reports having mild left sided weakness compared to her CVA in 2021 with heavy Right sided UE weakness. She reports bad OA affecting both knees as well.    Pt accompanied by: Husband brought patient  PERTINENT HISTORY: Previous CVA in 2021 affecting Right side. Reports going to ED  last Monday due to fall/CVA affecting Left side. Diagnosed with R CVA with Left Sided weakness.    PAIN:  Are you having pain? Yes: NPRS scale: Current = 1/10; worst= 6/10 Pain location: Bilateral knees- Arthritis  Pain description: achy yet sharp with weight bearing Aggravating factors: Increased walking, transfers Relieving factors: rest  PRECAUTIONS: Fall  RED FLAGS: None   WEIGHT BEARING RESTRICTIONS: No  FALLS: Has patient fallen in last  6 months? Yes. Number of falls fell with the stroke  LIVING ENVIRONMENT: Lives with: lives with their spouse Lives in: House/apartment Stairs: Yes: External: 2 steps;  on left going up Has following equipment at home: Single point cane, Walker - 2 wheeled, shower chair, and Grab bars  PLOF: Independent- Retired from [employer]  PATIENT GOALS: Walk without any assistive device.  OBJECTIVE:  Note: Objective measures were completed at Evaluation unless otherwise noted.  DIAGNOSTIC FINDINGS: MRI BRAIN WITHOUT CONTRAST  INDICATION: Neuro deficit, acute, stroke suspected, left sided facial weakness, I63.9 Cerebral infarction, unspecified (CMS/HHS-HCC)  COMPARISON: December 02, 2023 CTA head and neck  TECHNIQUE/PROTOCOL: Standard adult protocol brain MR without contrast.  FINDINGS: Brain Parenchyma: Multifocal areas of restricted diffusion within the right cerebral hemisphere; for example, within the right periventricular white matter (series 6 image 47), right posterior temporal lobe (series 6 image 44), inferior right temporal lobe (series 6 image 41), and right parietal lobe (series 6 image 50). No midline shift. Susceptibility artifact from left frontal approach ventriculostomy shunt catheter obscures a majority of the left hemisphere and susceptibility artifact from right ICA terminus coils portions of the right temporal lobe. Encephalomalacia around the coil pack and left frontal parietal lobe, unchanged. Area of FLAIR hyperintensity in the right cerebellar peduncle, likely representing wallerian degeneration. Scattered areas of increased T2/FLAIR signal intensity in the periventricular white matter, non-specific but likely the sequela of chronic small vessel ischemic disease. Ventricles and Sulci: Stable appearance of shunted ventricular system with left frontal approach ventriculostomy catheter with tip terminating near the septum pellucidum. Extra-Axial Spaces: No extra-axial fluid collection. Basal Cisterns: Normal. Intracranial Flow-Voids: Normal.  Paranasal Sinuses: Non-opacified. Mastoids: Normal. Orbits: Normal. Cranium: Multiple burr  holes. Visualized upper cervical spine: No high grade stenosis.   IMPRESSION: 1. Multifocal acute infarcts within the right MCA territory, as above. Distribution may reflect thromboembolic etiology from the more proximal right MCA. 2. Please note that considerable susceptibility artifact from left frontal approach ventriculostomy shunt catheter obscures a majority of the left hemisphere limiting evaluation and precluding assessment for infarct in these locations.  Findings were discussed communicated via Epic secure chat with Devora MD by Lang Payment, MD at 12/03/2023 12:50 AM.  The preliminary report (critical or emergent communication) was reviewed prior to this dictation and there are no substantial differences between the preliminary results and the impressions in this final report.  Electronically Reviewed by:  Metta Fruits, MD, [facility] Electronically Reviewed on:  12/03/2023 11:11 AM  I have reviewed the images and concur with the above findings.  Electronically Signed by:  Evalene Bigger, MD, [facility] Electronically Signed on:  12/03/2023 5:30 PM Procedure Note  Amrhein, Evalene Agent, MD - 12/03/2023 Formatting of this note might be different from the original. MRI BRAIN WITHOUT CONTRAST  INDICATION: Neuro deficit, acute, stroke suspected, left sided facial weakness, I63.9 Cerebral infarction, unspecified (CMS/HHS-HCC)  COMPARISON: December 02, 2023 CTA head and neck  TECHNIQUE/PROTOCOL: Standard adult protocol brain MR without contrast.  FINDINGS: Brain Parenchyma: Multifocal areas of restricted diffusion within the right cerebral hemisphere; for example, within the right periventricular white matter (series 6 image 47), right posterior temporal lobe (series 6 image 44), inferior right temporal lobe (series 6 image 41), and right parietal lobe (series 6 image 50). No midline shift. Susceptibility artifact from left frontal approach ventriculostomy shunt  catheter obscures a majority of the left hemisphere and susceptibility artifact from right ICA terminus coils portions of the right temporal lobe. Encephalomalacia around the coil  pack and left frontal parietal lobe, unchanged. Area of FLAIR hyperintensity in the right cerebellar peduncle, likely representing wallerian degeneration. Scattered areas of increased T2/FLAIR signal intensity in the periventricular white matter, non-specific but likely the sequela of chronic small vessel ischemic disease. Ventricles and Sulci: Stable appearance of shunted ventricular system with left frontal approach ventriculostomy catheter with tip terminating near the septum pellucidum. Extra-Axial Spaces: No extra-axial fluid collection. Basal Cisterns: Normal. Intracranial Flow-Voids: Normal.  Paranasal Sinuses: Non-opacified. Mastoids: Normal. Orbits: Normal. Cranium: Multiple burr holes. Visualized upper cervical spine: No high grade stenosis.   IMPRESSION: 1. Multifocal acute infarcts within the right MCA territory, as above. Distribution may reflect thromboembolic etiology from the more proximal right MCA. 2. Please note that considerable susceptibility artifact from left frontal approach ventriculostomy shunt catheter obscures a majority of the left hemisphere limiting evaluation and precluding assessment for infarct in these locations.  Findings were discussed communicated via Epic secure chat with Devora MD by Lang Payment, MD at 12/03/2023 12:50 AM.  The preliminary report (critical or emergent communication) was reviewed prior to this dictation and there are no substantial differences between the preliminary results and the impressions in this final report.  Electronically Reviewed by:  Metta Fruits, MD, [facility] Electronically Reviewed on:  12/03/2023 11:11 AM  I have reviewed the images and concur with the above findings.  Electronically Signed by:  Evalene Bigger, MD, [facility] Electronically Signed on:  12/03/2023 5:30 PM Exam End: 12/03/23 00:10   Specimen Collected: 12/02/23 Last Resulted: 12/03/23 17:30  Received From: [facility]  Result Received: 12/09/23 16:07     COGNITION: Overall cognitive status: Within functional limits for tasks assessed   SENSATION: WFL  COORDINATION: Impaired - delayed each LE  EDEMA:  None observed   POSTURE: rounded shoulders and forward head  LOWER EXTREMITY ROM:     Active  Right Eval Left Eval  Hip flexion    Hip extension    Hip abduction    Hip adduction    Hip internal rotation    Hip external rotation    Knee flexion    Knee extension    Ankle dorsiflexion    Ankle plantarflexion    Ankle inversion    Ankle eversion     (Blank rows = not tested)  LOWER EXTREMITY MMT:    MMT Right Eval Left Eval  Hip flexion 4 4  Hip extension    Hip abduction    Hip adduction    Hip internal rotation 2- 2-  Hip external rotation 3+ 3+  Knee flexion 3+ 3+  Knee extension 4 4  Ankle dorsiflexion 4 4  Ankle plantarflexion    Ankle inversion    Ankle eversion    (Blank rows = not tested)  BED MOBILITY:  Not tested  TRANSFERS: Sit to stand: CGA  Assistive device utilized: Environmental consultant - 2 wheeled     Stand to sit: CGA  Assistive device utilized: Environmental consultant - 2 wheeled      RAMP:  Not tested  CURB:  Not tested  STAIRS: Not tested GAIT: Findings: Gait Characteristics: step to pattern, decreased arm swing- Right, decreased arm swing- Left, decreased step length- Right, decreased step length- Left, decreased stance time- Right, decreased stance time- Left, decreased stride length, decreased hip/knee flexion- Right, decreased hip/knee flexion- Left, decreased ankle dorsiflexion- Right, decreased ankle dorsiflexion- Left, wide BOS, poor foot clearance- Right, and poor foot clearance- Left, Distance walked: 60 feet, Assistive device utilized:Walker -  2 wheeled, Level of assistance: CGA,  and Comments:    FUNCTIONAL TESTS:  5 times sit to stand: 25.20 sec with LUE Support Timed up and go (TUG): 40 sec with RW (increased time to place right hand on walker)  6 minute walk test: 482' 10 meter walk test: 0.65 m/s Berg Balance Scale: 42/56    PATIENT SURVEYS:  Stroke Impact Scale - 65.63%                                                                                                                             TREATMENT DATE: 01/02/24   TherEx:  Seated LAQ 3# Aw donned, 2x15 Hip abduction, GTB,  2x15 each LE stabilizing the attempt on the contralateral LE Hip flexion, 3# AW donned, 2x15 each LE Seated hamstring curls with GTB, 2x15 each LE STS with UE support on the chair with the L UE, x15    Gait Training:  Ambulation around the gym with no AD and close CGA utilized, with verbal and visual cuing for proper hand placement and taking equal length steps, lap around  the gym, 2 separate attempts, and then 1 attempt to OT apportionment.   PATIENT EDUCATION: Education details: PT plan of care; Discussion of symptoms of CVA and to go to ED as soon as possible. Purpose of PT for balance, strength, coordination and endurance.  Person educated: Patient Education method: Explanation Education comprehension: verbalized understanding  HOME EXERCISE PROGRAM: Access Code: [email or web address] URL: [email or web address] Date: 12/17/2023 Prepared by: Sidra Simpers  Program Notes **Be sure to perform all exercises next to a countertop or sturdy piece of furniture in case you become unsteady.  Exercises - Standing Heel Raise with Support  - 1 x daily - 7 x weekly - 2 sets - 15 reps - Standing Toe Raises at Chair  - 1 x daily - 7 x weekly - 2 sets - 15 reps - Mini Squat with Counter Support  - 1 x daily - 7 x weekly - 2 sets - 15 reps - Lunge with Counter Support  - 1 x daily - 7 x weekly - 2 sets - 15 reps - Standing Tandem Balance with Counter Support  - 1 x daily - 7 x  weekly - 2 sets - 2 reps - 30 hold - Standing Single Leg Stance with Counter Support  - 1 x daily - 7 x weekly - 2 sets - 2 reps - 30 hold  GOALS: Goals reviewed with patient? Yes  SHORT TERM GOALS: Target date: 01/21/2024  Pt will be independent with HEP in order to improve strength and balance in order to decrease fall risk and improve function at home.  Baseline: EVAL - No current formal HEP in place Goal status: INITIAL   LONG TERM GOALS: Target date: 03/03/2024  1.  Patient will complete five times sit to stand test in < 15 seconds indicating an increased LE strength and improved balance. Baseline: EVAL= 25.20  sec with LUE Support Goal status: INITIAL   2.  Patient will increase Berg Balance score by > 6 points to demonstrate decreased fall risk during functional activities. Baseline: EVAL: To be assessed next visit; 12/12/2023= 42/56 Goal status: INITIAL   3.  Patient will reduce timed up and go to <11 seconds to reduce fall risk and demonstrate improved transfer/gait ability. Baseline: EVAL = 40 sec with RW Goal status: INITIAL  4.  Patient will increase 10 meter walk test to >1.71m/s as to improve gait speed for better community ambulation and to reduce fall risk. Baseline: EVAL: 0.65 m/s Goal status: INITIAL  5. Patient will increase six minute walk test distance by 200 feet for progression to community ambulator and improve gait ability Baseline: EVAL= 482' Goal status: INITIAL   ASSESSMENT:  CLINICAL IMPRESSION:  Pt overall tolerated the treatment well and was able to perform with increased resistance utilizing GTB and weight with the seated exercises.  Pt ultimately needs continued endurance training in order to improve overall tolerance to exercises.  Pt needed rest breaks in between bouts of ambulation and was fairly winded at the time as well.  Pt given education on pursed lip breathing throughout the session as well.   Pt will continue to benefit from skilled  therapy to address remaining deficits in order to improve overall QoL and return to PLOF.        OBJECTIVE IMPAIRMENTS: Abnormal gait, decreased activity tolerance, decreased balance, decreased coordination, decreased endurance, decreased mobility, difficulty walking, decreased ROM, decreased strength, impaired UE functional use, and pain.   ACTIVITY LIMITATIONS: carrying, lifting, bending, sitting, standing, squatting, sleeping, stairs, and transfers  PARTICIPATION LIMITATIONS: meal prep, cleaning, laundry, driving, shopping, community activity, and yard work  PERSONAL FACTORS: 1-2 comorbidities: OA, previous CVA with significant R sided weakness are also affecting patient's functional outcome.   REHAB POTENTIAL: Good  CLINICAL DECISION MAKING: Stable/uncomplicated  EVALUATION COMPLEXITY: Moderate  PLAN:  PT FREQUENCY: 1-2x/week  PT DURATION: 12 weeks  PLANNED INTERVENTIONS: 97164- PT Re-evaluation, 97750- Physical Performance Testing, 97110-Therapeutic exercises, 97530- Therapeutic activity, [id number]- Neuromuscular re-education, 97535- Self Care, [id number]- Manual therapy, [id number]- Gait training, [id number]- Orthotic Initial, [phone]- Orthotic/Prosthetic subsequent, [phone]- Canalith repositioning, [id number]- Electrical stimulation (manual), [phone] (1-2 muscles), [id number] (3+ muscles)- Dry Needling, Patient/Family education, Balance training, Stair training, Taping, Joint mobilization, Joint manipulation, Spinal manipulation, Spinal mobilization, Compression bandaging, Vestibular training, DME instructions, Cryotherapy, and Moist heat  PLAN FOR NEXT SESSION:   Continue with LE strengthening, transfer safety, gait training with appropriate AD Assess HEP compliance and progress as appropriate.    Fonda Simpers, PT, DPT Physical Therapist - [facility]  01/02/24, 3:02 PM

## 2024-01-02 NOTE — Therapy (Addendum)
 OUTPATIENT OCCUPATIONAL THERAPY NEURO TREATMENT NOTE  Patient Name: Erika Stanton MRN: [id number] DOB:Jan 14, 1956, 68 y.o., female Today's Date: 01/02/2024  PCP: Laurice Anes, MD REFERRING PROVIDER: Laurice Anes, MD   OT End of Session - 01/02/24 2246     Visit Number 6    Number of Visits 24    Date for OT Re-Evaluation 03/03/24    OT Start Time 1445    OT Stop Time 1530    OT Time Calculation (min) 45 min    Activity Tolerance Patient tolerated treatment well    Behavior During Therapy [facility] for tasks assessed/performed         Past Medical History:  Diagnosis Date   Anatomical narrow angle, bilateral    Aneurysm (HCC)    Asthma    Cerebral aneurysm    Cerebral vasospasm    Cognitive change    Dysphagia, post-stroke    GERD (gastroesophageal reflux disease)    Hemorrhoids    History of cervical dysplasia    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 no recurrent polyps.  suggest repeat surveillance study 11/2022.     COLONOSCOPY W/ POLYPECTOMY  06/2014   Dr Elder at [facility].  3 adenomatoous polyps, anal fissure.     ESOPHAGOGASTRODUODENOSCOPY (EGD) WITH PROPOFOL  N/A 01/27/2020   Procedure: ESOPHAGOGASTRODUODENOSCOPY (EGD) WITH PROPOFOL ;  Surgeon: Shila Gustav GAILS, MD;  Location: [facility] ENDOSCOPY;  Service: Endoscopy;  Laterality: N/A;   INSERTION OF PERMANENT INTRAPERITONEAL CANNULA/CATH N/A    LAPAROSCOPIC   IR 3D INDEPENDENT WKST  01/06/2020   IR ANGIO INTRA EXTRACRAN SEL INTERNAL CAROTID BILAT MOD SED  01/06/2020   IR ANGIO VERTEBRAL SEL  VERTEBRAL UNI L MOD SED  01/06/2020   IR ANGIOGRAM FOLLOW UP STUDY  01/06/2020   IR ANGIOGRAM FOLLOW UP STUDY  01/06/2020   IR ANGIOGRAM FOLLOW UP STUDY  01/06/2020   IR ANGIOGRAM FOLLOW UP STUDY  01/06/2020   IR ANGIOGRAM FOLLOW UP STUDY  01/06/2020   IR ANGIOGRAM FOLLOW UP STUDY  01/06/2020   IR ANGIOGRAM FOLLOW UP STUDY  01/06/2020   IR ANGIOGRAM FOLLOW UP STUDY  01/06/2020   IR ANGIOGRAM FOLLOW UP STUDY  01/06/2020   IR ANGIOGRAM FOLLOW UP STUDY  01/06/2020   IR NEURO EACH ADD'L AFTER BASIC UNI RIGHT (MS)  01/06/2020   IR TRANSCATH/EMBOLIZ  01/06/2020   MASTECTOMY     MOHS SURGERY N/A    RADIOLOGY WITH ANESTHESIA N/A 01/06/2020   Procedure: IR WITH ANESTHESIA FOR ANEURYSM;  Surgeon: Lanis Pupa, MD;  Location: [facility] OR;  Service: Radiology;  Laterality: N/A;   STENT SUPPORTED EMBOLIZATION OF RIGHT ICA ANEURYSM Right    TUBAL LIGATION     VENTRICULO-PERITONEAL SHUNT PLACEMENT / LAPAROSCOPIC INSERTION PERITONEAL CATHETER N/A    Patient Active Problem List   Diagnosis Date Noted   Dyspareunia in female 04/05/2020   History of abnormal cervical Pap smear 03/10/2020   GIB (gastrointestinal bleeding) 02/28/2020   Elevated BUN  Prediabetes    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   Cerebral vasospasm    Sinus tachycardia    Dysphagia, post-stroke    Thrombocytopenia (HCC)    Acute blood loss anemia    Brain aneurysm    ICH (intracerebral hemorrhage) (HCC) 01/05/2020   History of adenomatous polyp of colon 02/07/2016   Anatomical narrow angle, bilateral 12/14/2014   Fissure in ano 11/11/2014   Hemorrhoids 11/11/2014   Constipation 11/19/2013   GERD (gastroesophageal reflux disease) 03/24/2013   Irritable bowel syndrome with diarrhea 03/24/2013   Herpes zoster 05/14/2005   Abnormal Pap smear of cervix 05/15/1983   History of cervical dysplasia 05/15/1983   ONSET DATE: 12/02/2023  REFERRING DIAG: L ischemic CVA  THERAPY DIAG:  Muscle weakness  (generalized)  Rationale for Evaluation and Treatment: Rehabilitation  SUBJECTIVE:  SUBJECTIVE STATEMENT: Pt reports that she did not bring in her water  floss pic today.  Pt accompanied by: self  PERTINENT HISTORY:  Pt. Was admitted to [facility] from 12/02/23-12/06/23 with an Acute Right MCA CVA, Hydrocephalus with shunt  placement. History of Subarachnoid Hemorrhage with bilateral ICA coiled aneurysms. 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  PRECAUTIONS: None  WEIGHT BEARING RESTRICTIONS: No  PAIN: 12/31/23: 4/10 pain bilat knees  Are you having pain? No  FALLS: Has patient fallen in last 6 months? Yes. Number of falls 1  LIVING ENVIRONMENT: Lives with: lives with their family and lives with their spouse Lives in: House/apartment Stairs: hand rails, 2 steps   Has following equipment at home: [name], cane  PLOF: Independent with basic ADLs, Spouse help as needed  PATIENT GOALS: Strengthening  OBJECTIVE:  Note: Objective measures were completed at Evaluation unless otherwise noted.  HAND DOMINANCE: Left  ADLs: Overall ADLs: spouse assists when needed, Pt. reports that she can do most tasks herself. Uses her left hand to engage in self-care tasks. Eating: Able to cut foods with a fork, husband assists as needed.  Grooming: husband assists with applying toothpaste, able to brush teeth and hair UB Dressing: Spouse assists with applying/fastening bra, Pt. Requires assistance with zippers, and buttons. LB Dressing: Spouse assists with tying shoes when needed, uses slip on shoes. Pt. Does not zip pants. Toileting: Able to complete independently Bathing: Pt. Requires intermittent assistance with bathing  tasks. Tub Shower transfers: Spouse assists with transfers when/if needed Equipment: Shower seat with back  IADLs: Shopping: Spouse typically does the shopping, isnt able to carry heavy objects.  Light housekeeping: Spouse typically does most of the cleaning around the home, Pt. Cleans bathroom, and laundry Pt. Is unable to carry laundry due to exacerbation of weakness.  Meal Prep: Spouse typically cooks and prepares meals.  Community mobility: Pt. Requires assistance using walker. Pt. reports that she is unable to carry heavy items, and typically does not do the shopping.  Medication management: Pt. is able to take care of meds with pillbox Financial management:  Spouse takes care of it, and makes online payments.  Handwriting: 50% legible  MOBILITY STATUS: Needs Assist: Pt. Requires use of a walker   POSTURE COMMENTS:  No Significant postural limitations Sitting balance: Good  ACTIVITY TOLERANCE: Activity tolerance:   FUNCTIONAL OUTCOME MEASURES: TBD   UPPER EXTREMITY ROM:    Active ROM Right eval Left  eval  Shoulder flexion 94(121) 102(125)  Shoulder abduction 75(106) 110(126)  Shoulder adduction    Shoulder extension    Shoulder internal rotation    Shoulder external rotation    Elbow flexion 135(155) 140(150)  Elbow extension 0 -10(-9)  Wrist flexion 40(44) 42(58)  Wrist extension 30(34) 40(50)  Wrist ulnar deviation    Wrist radial deviation    Wrist pronation    Wrist supination    (Blank rows = not tested)  UPPER EXTREMITY MMT:     MMT Right eval Left eval  Shoulder flexion 3-/5 3-/5  Shoulder abduction 3-/5 3-/5  Shoulder adduction    Shoulder extension    Shoulder internal rotation    Shoulder external rotation    Middle trapezius    Lower trapezius    Elbow flexion 4-/5 4+/5  Elbow extension 4-/5 4+/5  Wrist flexion 4-/5 3+/5  Wrist extension 4-/5 3+/5  Wrist ulnar deviation    Wrist radial deviation    Wrist pronation    Wrist supination     (Blank rows = not tested)  HAND FUNCTION: Grip strength: Right: N/T lbs; Left: 43 lbs, Lateral pinch: Right: N/T lbs, Left: 9 lbs, and 3 point pinch: Right: N/T lbs, Left: 5 lbs Pt. Presents with flexor tightness in the right 5th digit, however is able to passively extend 5th digit within normal range. Pt. Also Presents with hyper extension in the 2nd and 3rd digit at PIP joints. Pt. Is able to achieve full Digit MP, PIP, and DIP PROM.  COORDINATION: 9 Hole Peg test: Right: N/T sec; Left: 51 sec  SENSATION: Not tested  EDEMA:   MUSCLE TONE: Fluctuating flexor tone in R hand.  COGNITION: Overall cognitive status: Within functional limits for tasks assessed  VISION: Subjective report:  Baseline vision:  Visual history:   VISION ASSESSMENT:  PERCEPTION:   PRAXIS:   OBSERVATIONS: Pt. presents with fluctuating flexor tone in the R hand, involving the 5th digit at the PIP/DIP joint. Pt. Presents with hyperextension at the 2nd and 3rd digits at the PIP joints.                                                                                                                    TREATMENT DATE: 01/02/2024  Therapeutic Exercises: -PROM/AAROM  was performed with gentle stretching of the right wrist in flexion, extension, radial and ulnar deviation, 2nd-5th digit MP, PIP, and DIP flexion, and extension 2nd to tone, and tightness.  Self Care: -Reviewed manual plugging/unplugging various sized chargers into electronic devices with vc for positioning of the device, inserting the charger cord into the device with the left hand while using the R hand as a stabilize. Pt required max vc for lining up charger with device, often times attempting to place the charger into the device at a different angle.   Therapeutic Activity: -Pt. Worked on using her left hand for  placing grooved pegs into various angled slots on the grooved pegboard to work the Engineer, technical sales  skills  components skills needed to accurately align, and fit the appropriate connector into the designated device.  Pt requires mod vc  to find, and align each peg into the appropriate slot direction. The [facility] component of twisting the peg within her finger tips was eliminated to encourage the Pt. To work primarily on being able to perceptually match the direction of the grooved peg with the appropriate direction of the slot.    Orthotic Fitting:  -Assessed Oval-8 splints size 9 for the right 2nd, and 3rd digits. The splints were initially easy to donn, and doff.  Pt. was able to tolerate splint wearing for 10 min. during the session with no redness, or skin irritation , however the splints became more snug, and difficult to remove after 10 min. of wearing. Will need additional reassessment of splint fit with gradual increases of wearing time in the clinic prior to releasing the splints for home use.        PATIENT EDUCATION: Education details: oval-8 splint fit Person educated: Patient Education method: Explanation, Demonstration, Tactile cues, and Verbal cues Education comprehension: verbalized understanding and returned demonstration, further training needed  HOME EXERCISE PROGRAM: Pink theraputty; visual handout issued  GOALS: Goals reviewed with patient? Yes  SHORT TERM GOALS: Target date: 01/21/2024    Pt. Will be independent with HEP for UE functioning. Baseline: Eval:No current HEP Goal status: INITIAL  LONG TERM GOALS: Target date: 03/03/2024    Pt. Will improve [facility] skills to be able to be able to manipulate small objects at home Baseline: 9 hole peg test: L: 51 seconds Goal status: INITIAL  2.  Pt. Will increase BUE shoulder ROM by 10 degrees to be able to reach into cabinets and shelves.  Baseline: Shoulder flexion: R: 94(121), L: 102(125), Shoulder abduction: R: 75(106), L: 110(126) Goal status: INITIAL  3.  Pt. Will improve L grip strength by 5# to be able to securely  grasp ADL items at home.  Baseline: Grip strength: R: NT, L: 43# Goal status: INITIAL  4.  Pt. Will increase L lateral key pinch strength by 3#  to open wide mouth jars.  Baseline: Lateral key pinch: R: NT, L: 9# Goal status: INITIAL  5.  Pt. Will independently engage the right hand as a gross assist to the left hand 100% of the time during ADLs/IADLs. Baseline: Eval: Pt. With limited engagement of the right hand as a gross assist during tasks.  Goal status: INITIAL  6.  Pt. Will  improve left hand [facility] skills by 3 sec. of speed to be able to manipulate zippers, and buttons efficiently. Baseline: Eval:Right: NT Left: 51 sec.  Goal status: INITIAL  7. Pt. Will improve handwriting legibility to 100% in printed form for written correspondence.  Baseline: 50% legible in printed form for first name only. Goal status: INITIAL  ASSESSMENT:  CLINICAL IMPRESSION:  Pt. reports having hand a sad day, and is really upset that she will not be able to have her knee replacement, and ultimately will most likely miss their friend's children's wedding next spring. Pt. reports that she will not be able to go to [facility], either, but is most upset about missing the wedding. Pt. reports that there is still a chance that her husband will  be able to drive them both there, but is concerned that it may be too much for her husband to drive to [place] . Pt. Was able to tolerate initial Oval-8 splint fits, however will need to reassess tolerance of  the wearing schedule as they initially donned and doffed easily, however became very snug after 10 minutes of wearing time. Pt. Presented with difficulty fitting various sized, and shaped chargers into connectors on electronic devices. Pt. also presented with difficulty aligning the appropriate grooved peg into the appropriate direction slot on the pegboard, even with modifications made during the task to eliminate the need for her to coordinate twisting them within her  digits to match the peg to the groove. Pt. Would benefit from a visual perceptual assessment of visual discrimination skills. Pt. continues to benefit from OT services to increase BUE ROM in the shoulder, wrist and hands, as well as increasing L grip/pinch strength and improve [facility] skills to be able to perform her desired ADL/IADLs.   PERFORMANCE DEFICITS: in functional skills including ADLs, IADLs, coordination, dexterity, edema, tone, ROM, strength, pain, Fine motor control, Gross motor control, endurance, and UE functional use, and psychosocial skills including coping strategies, environmental adaptation, habits, and routines and behaviors.   IMPAIRMENTS: are limiting patient from ADLs, IADLs, rest and sleep, leisure, and social participation.   CO-MORBIDITIES: may have co-morbidities  that affects occupational performance. Patient will benefit from skilled OT to address above impairments and improve overall function.  MODIFICATION OR ASSISTANCE TO COMPLETE EVALUATION: Min-Moderate modification of tasks or assist with assess necessary to complete an evaluation.  OT OCCUPATIONAL PROFILE AND HISTORY: Detailed assessment: Review of records and additional review of physical, cognitive, psychosocial history related to current functional performance.  CLINICAL DECISION MAKING: Moderate - several treatment options, min-mod task modification necessary  REHAB POTENTIAL: Good  EVALUATION COMPLEXITY: Moderate    PLAN:  OT FREQUENCY: 2x/week  OT DURATION: 12 weeks  PLANNED INTERVENTIONS: 97168 OT Re-evaluation, 97535 self care/ADL training, 02889 therapeutic exercise, 97530 therapeutic activity, 97112 neuromuscular re-education, 97140 manual therapy, 97018 paraffin, 02960 fluidotherapy, 97010 moist heat, 97010 cryotherapy, 97034 contrast bath, 97032 electrical stimulation (manual), 97760 Orthotic Initial, passive range of motion, energy conservation, coping strategies training, patient/family  education, and DME and/or AE instructions  RECOMMENDED OTHER SERVICES: PT   CONSULTED AND AGREED WITH PLAN OF CARE: Patient  PLAN FOR NEXT SESSION: Treatment  Elinora Weigand, MS, OTR/L   01/02/2024, 10:54 PM

## 2024-01-02 NOTE — Therapy (Incomplete)
 OUTPATIENT OCCUPATIONAL THERAPY NEURO TREATMENT NOTE  Patient Name: Erika Stanton MRN: [id number] DOB:26-May-1955, 68 y.o., female Today's Date: 01/02/2024  PCP: Laurice Anes, MD REFERRING PROVIDER: Laurice Anes, MD   OT End of Session - 01/02/24 2246     Visit Number 6    Number of Visits 24    Date for OT Re-Evaluation 03/03/24    OT Start Time 1445    OT Stop Time 1530    OT Time Calculation (min) 45 min    Activity Tolerance Patient tolerated treatment well    Behavior During Therapy [facility] for tasks assessed/performed         Past Medical History:  Diagnosis Date  . Anatomical narrow angle, bilateral   . Aneurysm (HCC)   . Asthma   . Cerebral aneurysm   . Cerebral vasospasm   . Cognitive change   . Dysphagia, post-stroke   . GERD (gastroesophageal reflux disease)   . Hemorrhoids   . History of cervical dysplasia   . 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 Hydrocephalus (HCC)   . ICH (intracerebral hemorrhage) (HCC)   . Irritable bowel syndrome with diarrhea   . Melena   . OA (osteoarthritis) of knee   . Osteoporosis, post-menopausal   . Spastic monoplegia of upper extremity (HCC)   . Subarachnoid hemorrhage [facility])    Past Surgical History:  Procedure Laterality Date  . BRAIN SURGERY N/A   . BREAST SURGERY    . CHOLECYSTECTOMY    . COLONOSCOPY  11/2017   at [facility]. no recurrent polyps.  suggest repeat surveillance study 11/2022.    SABRA COLONOSCOPY W/ POLYPECTOMY  06/2014   Dr Elder at [facility].  3 adenomatoous polyps, anal fissure.    . ESOPHAGOGASTRODUODENOSCOPY (EGD) WITH PROPOFOL  N/A 01/27/2020   Procedure: ESOPHAGOGASTRODUODENOSCOPY (EGD) WITH PROPOFOL ;  Surgeon: Shila Gustav GAILS, MD;  Location: [facility] ENDOSCOPY;  Service: Endoscopy;  Laterality: N/A;  . INSERTION OF PERMANENT INTRAPERITONEAL CANNULA/CATH N/A    LAPAROSCOPIC  . IR 3D INDEPENDENT WKST  01/06/2020  . IR ANGIO INTRA EXTRACRAN SEL INTERNAL CAROTID BILAT MOD SED  01/06/2020   . IR ANGIO VERTEBRAL SEL VERTEBRAL UNI L MOD SED  01/06/2020  . IR ANGIOGRAM FOLLOW UP STUDY  01/06/2020  . IR ANGIOGRAM FOLLOW UP STUDY  01/06/2020  . IR ANGIOGRAM FOLLOW UP STUDY  01/06/2020  . IR ANGIOGRAM FOLLOW UP STUDY  01/06/2020  . IR ANGIOGRAM FOLLOW UP STUDY  01/06/2020  . IR ANGIOGRAM FOLLOW UP STUDY  01/06/2020  . IR ANGIOGRAM FOLLOW UP STUDY  01/06/2020  . IR ANGIOGRAM FOLLOW UP STUDY  01/06/2020  . IR ANGIOGRAM FOLLOW UP STUDY  01/06/2020  . IR ANGIOGRAM FOLLOW UP STUDY  01/06/2020  . IR NEURO EACH ADD'L AFTER BASIC UNI RIGHT (MS)  01/06/2020  . IR TRANSCATH/EMBOLIZ  01/06/2020  . MASTECTOMY    . MOHS SURGERY N/A   . RADIOLOGY WITH ANESTHESIA N/A 01/06/2020   Procedure: IR WITH ANESTHESIA FOR ANEURYSM;  Surgeon: Lanis Pupa, MD;  [facility] OR;  Service: Radiology;  Laterality: N/A;  . STENT SUPPORTED EMBOLIZATION OF RIGHT ICA ANEURYSM Right   . TUBAL LIGATION    . VENTRICULO-PERITONEAL SHUNT PLACEMENT / LAPAROSCOPIC INSERTION PERITONEAL CATHETER N/A    Patient Active Problem List   Diagnosis Date Noted  . Dyspareunia in female 04/05/2020  . History of abnormal cervical Pap smear 03/10/2020  . GIB (gastrointestinal bleeding) 02/28/2020  . Elevated BUN   .  Prediabetes   . 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 Cerebral vasospasm   . Sinus tachycardia   . Dysphagia, post-stroke   . Thrombocytopenia (HCC)   . Acute blood loss anemia   . Brain aneurysm   . ICH (intracerebral hemorrhage) (HCC) 01/05/2020  . History of adenomatous polyp of colon 02/07/2016  . 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 Fissure in ano 11/11/2014  . Hemorrhoids 11/11/2014  . Constipation 11/19/2013  . GERD (gastroesophageal reflux disease) 03/24/2013  . Irritable bowel syndrome with diarrhea 03/24/2013  . Herpes zoster 05/14/2005  . Abnormal Pap smear of cervix 05/15/1983  . History of cervical dysplasia 05/15/1983   ONSET DATE: 12/02/2023  REFERRING DIAG:  L ischemic CVA  THERAPY DIAG:  Muscle weakness (generalized)  Rationale for Evaluation and Treatment: Rehabilitation  SUBJECTIVE:  SUBJECTIVE STATEMENT: Pt reports she's been having difficulties plugging in her ipad charger and water  flosser charger. Pt accompanied by: self  PERTINENT HISTORY:  Pt. Was admitted to [facility] from 12/02/23-12/06/23 with an Acute Right MCA CVA, Hydrocephalus with shunt  placement. History of Subarachnoid Hemorrhage with bilateral ICA coiled aneurysms. 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  PRECAUTIONS: None  WEIGHT BEARING RESTRICTIONS: No  PAIN: 12/31/23: 4/10 pain bilat knees  Are you having pain? No  FALLS: Has patient fallen in last 6 months? Yes. Number of falls 1  LIVING ENVIRONMENT: Lives with: lives with their family and lives with their spouse Lives in: House/apartment Stairs: hand rails, 2 steps   Has following equipment at home: [name], cane  PLOF: Independent with basic ADLs, Spouse help as needed  PATIENT GOALS: Strengthening  OBJECTIVE:  Note: Objective measures were completed at Evaluation unless otherwise noted.  HAND DOMINANCE: Left  ADLs: Overall ADLs: spouse assists when needed, Pt. reports that she can do most tasks herself. Uses her left hand to engage in self-care tasks. Eating: Able to cut foods with a fork, husband assists as needed.  Grooming: husband assists with applying toothpaste, able to brush teeth and hair UB Dressing: Spouse assists with applying/fastening bra, Pt. Requires assistance with zippers, and buttons. LB Dressing: Spouse assists with tying shoes when needed, uses slip on shoes. Pt. Does not zip pants. Toileting: Able to complete  independently Bathing: Pt. Requires intermittent assistance with bathing tasks. Tub Shower transfers: Spouse assists with transfers when/if needed Equipment: Shower seat with back  IADLs: Shopping: Spouse typically does the shopping, isnt able to carry heavy objects.  Light housekeeping: Spouse typically does most of the cleaning around the home, Pt. Cleans bathroom, and laundry Pt. Is unable to carry laundry due to exacerbation of weakness.  Meal Prep: Spouse typically cooks and prepares meals.  Community mobility: Pt. Requires assistance using walker. Pt. reports that she is unable to carry heavy items, and typically does not do the shopping.  Medication management: Pt. is able to take care of meds with pillbox Financial management:  Spouse takes care of it, and makes online payments.  Handwriting: 50% legible  MOBILITY STATUS: Needs Assist: Pt. Requires use of a walker   POSTURE COMMENTS:  No Significant postural limitations Sitting balance: Good  ACTIVITY TOLERANCE: Activity tolerance:   FUNCTIONAL OUTCOME MEASURES: TBD   UPPER EXTREMITY ROM:    Active ROM Right eval  Left eval  Shoulder flexion 94(121) 102(125)  Shoulder abduction 75(106) 110(126)  Shoulder adduction    Shoulder extension    Shoulder internal rotation    Shoulder external rotation    Elbow flexion 135(155) 140(150)  Elbow extension 0 -10(-9)  Wrist flexion 40(44) 42(58)  Wrist extension 30(34) 40(50)  Wrist ulnar deviation    Wrist radial deviation    Wrist pronation    Wrist supination    (Blank rows = not tested)  UPPER EXTREMITY MMT:     MMT Right eval Left eval  Shoulder flexion 3-/5 3-/5  Shoulder abduction 3-/5 3-/5  Shoulder adduction    Shoulder extension    Shoulder internal rotation    Shoulder external rotation    Middle trapezius    Lower trapezius    Elbow flexion 4-/5 4+/5  Elbow extension 4-/5 4+/5  Wrist flexion 4-/5 3+/5  Wrist extension 4-/5 3+/5  Wrist ulnar  deviation    Wrist radial deviation    Wrist pronation    Wrist supination    (Blank rows = not tested)  HAND FUNCTION: Grip strength: Right: N/T lbs; Left: 43 lbs, Lateral pinch: Right: N/T lbs, Left: 9 lbs, and 3 point pinch: Right: N/T lbs, Left: 5 lbs Pt. Presents with flexor tightness in the right 5th digit, however is able to passively extend 5th digit within normal range. Pt. Also Presents with hyper extension in the 2nd and 3rd digit at PIP joints. Pt. Is able to achieve full Digit MP, PIP, and DIP PROM.  COORDINATION: 9 Hole Peg test: Right: N/T sec; Left: 51 sec  SENSATION: Not tested  EDEMA:   MUSCLE TONE: Fluctuating flexor tone in R hand.  COGNITION: Overall cognitive status: Within functional limits for tasks assessed  VISION: Subjective report:  Baseline vision:  Visual history:   VISION ASSESSMENT:  PERCEPTION:   PRAXIS:   OBSERVATIONS: Pt. presents with fluctuating flexor tone in the R hand, involving the 5th digit at the PIP/DIP joint. Pt. Presents with hyperextension at the 2nd and 3rd digits at the PIP joints.                                                                                                                    TREATMENT DATE: 01/02/2024  Therapeutic Exercises: -PROM/AAROM with gentle stretching of the right wrist in flexion, extension, radial and ulnar deviation, 2nd-5th digit MP, PIP, and DIP flexion, and extension 2nd to tone, and tightness.  Self Care: -Reviewed manual plugging/unplugging various sized chargers into electronic devices with vc for positioning of the device, inserting the charger cord into the device with the left hand while using the R hand as a stabilize. Pt required max vc for lining up charger with device, often times attempting to place the charger into the device at a different angle.   Therapeutic Activity: -Pt. Worked on placing grooved pegs into various angled slots on the grooved pegboard to work the Recruitment consultant components skills needed to accurately align,  and fit the appropriate connector into the designated device.  Pt requires mod vc  to find, and align each peg into the appropriate slot direction. The task of twisting the peg within her finger tips was eliminated to encourage the Pt. To work primarily on being able to perceptually match the direction of the grooved peg with the appropriate direction of the slot.    Orthotic Fitting:  -Assessed Oval-8 splints size 9 for the right 2nd, and 3rd digits. The splints were initially easy to donn, and doff.  Pt. was able to tolerate splint wearing for 10 min. during the session with no redness, or skin irritation , however the splints became more snug, and difficult to remove after 10 min. of wearing. Will need additional reassessment of splint fit with gradual increases of wearing time in the clinic prior to releasing the splints for home use.        PATIENT EDUCATION: Education details: Review of self PROM to R hand to reduce contracture risk Person educated: Patient Education method: Explanation, Demonstration, Tactile cues, and Verbal cues Education comprehension: verbalized understanding and returned demonstration, further training needed  HOME EXERCISE PROGRAM: Pink theraputty; visual handout issued  GOALS: Goals reviewed with patient? Yes  SHORT TERM GOALS: Target date: 01/21/2024    Pt. Will be independent with HEP for UE functioning. Baseline: Eval:No current HEP Goal status: INITIAL  LONG TERM GOALS: Target date: 03/03/2024    Pt. Will improve [facility] skills to be able to be able to manipulate small objects at home Baseline: 9 hole peg test: L: 51 seconds Goal status: INITIAL  2.  Pt. Will increase BUE shoulder ROM by 10 degrees to be able to reach into cabinets and shelves.  Baseline: Shoulder flexion: R: 94(121), L: 102(125), Shoulder abduction: R: 75(106), L: 110(126) Goal status:  INITIAL  3.  Pt. Will improve L grip strength by 5# to be able to securely grasp ADL items at home.  Baseline: Grip strength: R: NT, L: 43# Goal status: INITIAL  4.  Pt. Will increase L lateral key pinch strength by 3#  to open wide mouth jars.  Baseline: Lateral key pinch: R: NT, L: 9# Goal status: INITIAL  5.  Pt. Will independently engage the right hand as a gross assist to the left hand 100% of the time during ADLs/IADLs. Baseline: Eval: Pt. With limited engagement of the right hand as a gross assist during tasks.  Goal status: INITIAL  6.  Pt. Will  improve left hand [facility] skills by 3 sec. of speed to be able to manipulate zippers, and buttons efficiently. Baseline: Eval:Right: NT Left: 51 sec.  Goal status: INITIAL  7. Pt. Will improve handwriting legibility to 100% in printed form for written correspondence.  Baseline: 50% legible in printed form for first name only. Goal status: INITIAL  ASSESSMENT:  CLINICAL IMPRESSION:  Pt. reports having hand a sad day, and is really upset that she will not be able to have her knee replacement, and ultimately will most likely miss their friend's children's wedding next spring. Pt. reports that she will not be able to go to [facility] [place], either, but is most upset about missing the wedding. Pt. reports that there is still a chance that her husband will  be able to drive them both there, but is concerned that it may be too much for her husband to drive to [place] . Pt. Was able to tolerate Oval-8 splint fits, however will need to reassess tolerance of the wearing  schedule as they initially donned, and doffed easily, however became very snug after 10 minutes of wearing time. Pt. Presented with difficulty fitting various sized, and shaped chargers into connectors on electronic devices, as well as aligning the appropriate grooved peg into the  Pt. Would benefit from a visual perceptual assessment of visual discrimination skills.  Pt. continues to benefit  from OT services to increase BUE ROM in the shoulder, wrist and hands, as well as increasing L grip/pinch strength and improve [facility] skills to be able to perform her desired ADL/IADLs.   PERFORMANCE DEFICITS: in functional skills including ADLs, IADLs, coordination, dexterity, edema, tone, ROM, strength, pain, Fine motor control, Gross motor control, endurance, and UE functional use, and psychosocial skills including coping strategies, environmental adaptation, habits, and routines and behaviors.   IMPAIRMENTS: are limiting patient from ADLs, IADLs, rest and sleep, leisure, and social participation.   CO-MORBIDITIES: may have co-morbidities  that affects occupational performance. Patient will benefit from skilled OT to address above impairments and improve overall function.  MODIFICATION OR ASSISTANCE TO COMPLETE EVALUATION: Min-Moderate modification of tasks or assist with assess necessary to complete an evaluation.  OT OCCUPATIONAL PROFILE AND HISTORY: Detailed assessment: Review of records and additional review of physical, cognitive, psychosocial history related to current functional performance.  CLINICAL DECISION MAKING: Moderate - several treatment options, min-mod task modification necessary  REHAB POTENTIAL: Good  EVALUATION COMPLEXITY: Moderate    PLAN:  OT FREQUENCY: 2x/week  OT DURATION: 12 weeks  PLANNED INTERVENTIONS: 97168 OT Re-evaluation, 97535 self care/ADL training, 02889 therapeutic exercise, 97530 therapeutic activity, 97112 neuromuscular re-education, 97140 manual therapy, 97018 paraffin, 02960 fluidotherapy, 97010 moist heat, 97010 cryotherapy, 97034 contrast bath, 97032 electrical stimulation (manual), 97760 Orthotic Initial, passive range of motion, energy conservation, coping strategies training, patient/family education, and DME and/or AE instructions  RECOMMENDED OTHER SERVICES: PT   CONSULTED AND AGREED WITH PLAN OF CARE: Patient  PLAN FOR NEXT SESSION:  Treatment  Kaliah Haddaway, MS, OTR/L   01/02/2024, 10:54 PM

## 2024-01-06 ENCOUNTER — Ambulatory Visit

## 2024-01-06 DIAGNOSIS — M6281 Muscle weakness (generalized): Secondary | ICD-10-CM

## 2024-01-06 DIAGNOSIS — R278 Other lack of coordination: Secondary | ICD-10-CM

## 2024-01-08 NOTE — Therapy (Signed)
 OUTPATIENT OCCUPATIONAL THERAPY NEURO TREATMENT NOTE  Patient Name: Erika Stanton MRN: [id number] DOB:06/23/1955, 68 y.o., female Today's Date: 01/08/2024  PCP: Laurice Anes, MD REFERRING PROVIDER: Laurice Anes, MD   OT End of Session - 01/08/24 1200     Visit Number 7    Number of Visits 24    Date for OT Re-Evaluation 03/03/24    OT Start Time 1100    OT Stop Time 1145    OT Time Calculation (min) 45 min    Activity Tolerance Patient tolerated treatment well    Behavior During Therapy WFL for tasks assessed/performed         Past Medical History:  Diagnosis Date   Anatomical narrow angle, bilateral    Aneurysm (HCC)    Asthma    Cerebral aneurysm    Cerebral vasospasm    Cognitive change    Dysphagia, post-stroke    GERD (gastroesophageal reflux disease)    Hemorrhoids    History of cervical dysplasia    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 no recurrent polyps.  suggest repeat surveillance study 11/2022.     COLONOSCOPY W/ POLYPECTOMY  06/2014   Dr Elder at [facility].  3 adenomatoous polyps, anal fissure.     ESOPHAGOGASTRODUODENOSCOPY (EGD) WITH PROPOFOL  N/A 01/27/2020   Procedure: ESOPHAGOGASTRODUODENOSCOPY (EGD) WITH PROPOFOL ;  Surgeon: Shila Gustav GAILS, MD;  Location: [facility] END[facility];  Service: Endoscopy;  Laterality: N/A;   INSERTION OF PERMANENT INTRAPERITONEAL CANNULA/CATH N/A    LAPAROSCOPIC   IR 3D INDEPENDENT WKST  01/06/2020   IR ANGIO INTRA EXTRACRAN SEL INTERNAL CAROTID BILAT MOD SED  01/06/2020   IR ANGIO VERTEBRAL SEL  VERTEBRAL UNI L MOD SED  01/06/2020   IR ANGIOGRAM FOLLOW UP STUDY  01/06/2020   IR ANGIOGRAM FOLLOW UP STUDY  01/06/2020   IR ANGIOGRAM FOLLOW UP STUDY  01/06/2020   IR ANGIOGRAM FOLLOW UP STUDY  01/06/2020   IR ANGIOGRAM FOLLOW UP STUDY  01/06/2020   IR ANGIOGRAM FOLLOW UP STUDY  01/06/2020   IR ANGIOGRAM FOLLOW UP STUDY  01/06/2020   IR ANGIOGRAM FOLLOW UP STUDY  01/06/2020   IR ANGIOGRAM FOLLOW UP STUDY  01/06/2020   IR ANGIOGRAM FOLLOW UP STUDY  01/06/2020   IR NEURO EACH ADD'L AFTER BASIC UNI RIGHT (MS)  01/06/2020   IR TRANSCATH/EMBOLIZ  01/06/2020   MASTECTOMY     MOHS SURGERY N/A    RADIOLOGY WITH ANESTHESIA N/A 01/06/2020   Procedure: IR WITH ANESTHESIA FOR ANEURYSM;  Surgeon: Lanis Pupa, MD;  Location: [facility] OR;  Service: Radiology;  Laterality: N/A;   STENT SUPPORTED EMBOLIZATION OF RIGHT ICA ANEURYSM Right    TUBAL LIGATION     VENTRICULO-PERITONEAL SHUNT PLACEMENT / LAPAROSCOPIC INSERTION PERITONEAL CATHETER N/A    Patient Active Problem List   Diagnosis Date Noted   Dyspareunia in female 04/05/2020   History of abnormal cervical Pap smear 03/10/2020   GIB (gastrointestinal bleeding) 02/28/2020   Elevated BUN  Prediabetes    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   Cerebral vasospasm    Sinus tachycardia    Dysphagia, post-stroke    Thrombocytopenia (HCC)    Acute blood loss anemia    Brain aneurysm    ICH (intracerebral hemorrhage) (HCC) 01/05/2020   History of adenomatous polyp of colon 02/07/2016   Anatomical narrow angle, bilateral 12/14/2014   Fissure in ano 11/11/2014   Hemorrhoids 11/11/2014   Constipation 11/19/2013   GERD (gastroesophageal reflux disease) 03/24/2013   Irritable bowel syndrome with diarrhea 03/24/2013   Herpes zoster 05/14/2005   Abnormal Pap smear of cervix 05/15/1983   History of cervical dysplasia 05/15/1983   ONSET DATE: 12/02/2023  REFERRING DIAG: L ischemic CVA  THERAPY DIAG:  Muscle weakness  (generalized)  Other lack of coordination  Rationale for Evaluation and Treatment: Rehabilitation  SUBJECTIVE:  SUBJECTIVE STATEMENT: Spouse present with pt today for duration of tx session with request to review pt's exercise routine (spouse assists daily with HEP) and OT to perform fit check for resting hand splint.   Pt accompanied by: spouse, Garrel  PERTINENT HISTORY:  Pt. Was admitted to [facility] from 12/02/23-12/06/23 with an Acute Right MCA CVA, Hydrocephalus with shunt  placement. History of Subarachnoid Hemorrhage with bilateral ICA coiled aneurysms. 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  PRECAUTIONS: None  WEIGHT BEARING RESTRICTIONS: No  PAIN: 01/06/24: 4/10 pain bilat knees  Are you having pain? No  FALLS: Has patient fallen in last 6 months? Yes. Number of falls 1  LIVING ENVIRONMENT: Lives with: lives with their family and lives with their spouse Lives in: House/apartment Stairs: hand rails, 2 steps   Has following equipment at home: [name], cane  PLOF: Independent with basic ADLs, Spouse help as needed  PATIENT GOALS: Strengthening  OBJECTIVE:  Note: Objective measures were completed at Evaluation unless otherwise noted.  HAND DOMINANCE: Left  ADLs: Overall ADLs: spouse assists when needed, Pt. reports that she can do most tasks herself. Uses her left hand to engage in self-care tasks. Eating: Able to cut foods with a fork, husband assists as needed.  Grooming: husband assists with applying toothpaste, able to brush teeth and hair UB Dressing: Spouse assists with applying/fastening bra, Pt. Requires assistance with zippers, and buttons. LB Dressing: Spouse assists with tying shoes when  needed, uses slip on shoes. Pt. Does not zip pants. Toileting: Able to complete independently Bathing: Pt. Requires intermittent assistance with bathing tasks. Tub Shower transfers: Spouse assists with transfers when/if needed Equipment: Shower seat with back  IADLs: Shopping: Spouse typically does the shopping, isnt able to carry heavy objects.  Light housekeeping: Spouse typically does most of the cleaning around the home, Pt. Cleans bathroom, and laundry Pt. Is unable to carry laundry due to exacerbation of weakness.  Meal Prep: Spouse typically cooks and prepares meals.  Community mobility: Pt. Requires assistance using walker. Pt. reports that she is unable to carry heavy items, and typically does not do the shopping.  Medication management: Pt. is able to take care of meds with pillbox Financial management:  Spouse takes care of it, and makes online payments.  Handwriting: 50% legible  MOBILITY STATUS: Needs Assist: Pt. Requires use of a walker   POSTURE COMMENTS:  No Significant postural limitations Sitting  balance: Good  ACTIVITY TOLERANCE: Activity tolerance:   FUNCTIONAL OUTCOME MEASURES: TBD  UPPER EXTREMITY ROM:    Active ROM Right eval Left eval  Shoulder flexion 94(121) 102(125)  Shoulder abduction 75(106) 110(126)  Shoulder adduction    Shoulder extension    Shoulder internal rotation    Shoulder external rotation    Elbow flexion 135(155) 140(150)  Elbow extension 0 -10(-9)  Wrist flexion 40(44) 42(58)  Wrist extension 30(34) 40(50)  Wrist ulnar deviation    Wrist radial deviation    Wrist pronation    Wrist supination    (Blank rows = not tested)  UPPER EXTREMITY MMT:     MMT Right eval Left eval  Shoulder flexion 3-/5 3-/5  Shoulder abduction 3-/5 3-/5  Shoulder adduction    Shoulder extension    Shoulder internal rotation    Shoulder external rotation    Middle trapezius    Lower trapezius    Elbow flexion 4-/5 4+/5  Elbow extension 4-/5  4+/5  Wrist flexion 4-/5 3+/5  Wrist extension 4-/5 3+/5  Wrist ulnar deviation    Wrist radial deviation    Wrist pronation    Wrist supination    (Blank rows = not tested)  HAND FUNCTION: Grip strength: Right: N/T lbs; Left: 43 lbs, Lateral pinch: Right: N/T lbs, Left: 9 lbs, and 3 point pinch: Right: N/T lbs, Left: 5 lbs Pt. Presents with flexor tightness in the right 5th digit, however is able to passively extend 5th digit within normal range. Pt. Also Presents with hyper extension in the 2nd and 3rd digit at PIP joints. Pt. Is able to achieve full Digit MP, PIP, and DIP PROM.  COORDINATION: 9 Hole Peg test: Right: N/T sec; Left: 51 sec  SENSATION: Not tested  EDEMA:   MUSCLE TONE: Fluctuating flexor tone in R hand.  COGNITION: Overall cognitive status: Within functional limits for tasks assessed  VISION: Subjective report:  Baseline vision:  Visual history:   VISION ASSESSMENT:  PERCEPTION:   PRAXIS:   OBSERVATIONS: Pt. presents with fluctuating flexor tone in the R hand, involving the 5th digit at the PIP/DIP joint. Pt. Presents with hyperextension at the 2nd and 3rd digits at the PIP joints.                                                                                                                    TREATMENT DATE: 01/06/2024 Therapeutic Exercises: -HEP review with pt and caregiver (spouse).  Spouse demonstrated passive stretching to R hand digits.  OT provided education on performing stretches slow to avoid pain and to minimize increase in hypertonicity.  OT added digit abd to current regimen, thumb abd, and wrist flex/ext with forearm isolated on arm rest of chair.  Instructed caregiver in passive stretching techniques for additions noted above, and pt for self PROM techniques.  Self Care: -Goal/poc review with pt and spouse -Spouse concerned about keeping pt's L hand functional (OT reviewed goals to address this), and spouse is also concerned about  pt's time  management skills.  OT reviewed possible changes in cognition comparing status pre and post most recent CVA.  Educated on benefits of SLP eval (pt/spouse receptive) to address spouse's concerns; OT faxed request for SLP eval/tx.   -Education provided on benefits of stretches noted above to reduce contracture risk and to maintain hand hygiene/avoid skin breakdown.   -Fall prevention education completed with pt and spouse.  OT encouraging pt avoid reaching to floor from standing position after spouse verbalized frustration of pt leaving tissues on the ground.    Orthotic/prosthetic subsequent: -Assessed Oval-8 splints size 9 for the right 2nd, and 3rd digits.  Pt/spouse brought in their splints from home which they recently found, 1 size 9, and 1 size 7.  OT determined size 7 to be too small and issued a size 9; pt will now a size for R 2nd digit PIP and R 3rd digit PIP.   -Advised on wearing schedule for oval 8 splints, beginning with 30 min on a few times per day; pt/spouse to complete skin check after splint is doffed each time and discontinue use if signs of redness do not resolve after a few min or if skin breakdown occurs. -Fit of R Softpro resting hand splint assessed.  OT adjusted slightly to allowing increased MP and PIP flexion d/t gapping noted beneath the MCPs, and also with pt reporting that she can't tolerate her splint more than 30 min.  Reviewed recommended wearing schedule with encouragement to don 30 min at a time during the day, increase as tolerated.  Need to increase wearing time for several hours without pain before attempting to don for night time.    PATIENT EDUCATION: Education details: splint wearing schedule, HEP review, poc review Person educated: Patient, spouse Education method: Explanation, Demonstration, Tactile cues, and Verbal cues Education comprehension: verbalized understanding and returned demonstration, further training needed  HOME EXERCISE PROGRAM: Pink  theraputty; visual handout issued -Self and caregiver assisted PROM to R hand and wrist  GOALS: Goals reviewed with patient? Yes  SHORT TERM GOALS: Target date: 01/21/2024    Pt. Will be independent with HEP for UE functioning. Baseline: Eval:No current HEP Goal status: INITIAL  LONG TERM GOALS: Target date: 03/03/2024    Pt. Will improve [facility] skills to be able to be able to manipulate small objects at home Baseline: 9 hole peg test: L: 51 seconds Goal status: INITIAL  2.  Pt. Will increase BUE shoulder ROM by 10 degrees to be able to reach into cabinets and shelves.  Baseline: Shoulder flexion: R: 94(121), L: 102(125), Shoulder abduction: R: 75(106), L: 110(126) Goal status: INITIAL  3.  Pt. Will improve L grip strength by 5# to be able to securely grasp ADL items at home.  Baseline: Grip strength: R: NT, L: 43# Goal status: INITIAL  4.  Pt. Will increase L lateral key pinch strength by 3#  to open wide mouth jars.  Baseline: Lateral key pinch: R: NT, L: 9# Goal status: INITIAL  5.  Pt. Will independently engage the right hand as a gross assist to the left hand 100% of the time during ADLs/IADLs. Baseline: Eval: Pt. With limited engagement of the right hand as a gross assist during tasks.  Goal status: INITIAL  6.  Pt. Will  improve left hand [facility] skills by 3 sec. of speed to be able to manipulate zippers, and buttons efficiently. Baseline: Eval:Right: NT Left: 51 sec.  Goal status: INITIAL  7. Pt. Will improve handwriting legibility  to 100% in printed form for written correspondence.  Baseline: 50% legible in printed form for first name only. Goal status: INITIAL  ASSESSMENT:  CLINICAL IMPRESSION: Spouse present for tx today for HEP review, splint check assessment, and poc review.  OT provided splint adjustments and recommendations for wearing schedule as noted above, HEP review, and recommendation for SLP eval to assess/tx cognitive deficits post CVA.  SLP order faxed.    Order also sent to MD for new Soft Pro resting hand splint, per spouse's request.  Will plan to send signed script to [facility] when received.  Spouse receptive to education provided during session.  Pt intermittently tearful this date when reviewing OT goals, spouse's goals for pt, possible cognitive changes, and pt's feelings of being a burden to her caregivers.  OT provided therapeutic listening and encouragement.  Pt will benefit from a visual perceptual assessment of visual discrimination skills. Pt. continues to benefit from OT services to increase BUE ROM in the shoulder, wrist and hands, as well as increasing L grip/pinch strength and improve [facility] skills to be able to perform her desired ADL/IADLs.   PERFORMANCE DEFICITS: in functional skills including ADLs, IADLs, coordination, dexterity, edema, tone, ROM, strength, pain, Fine motor control, Gross motor control, endurance, and UE functional use, and psychosocial skills including coping strategies, environmental adaptation, habits, and routines and behaviors.   IMPAIRMENTS: are limiting patient from ADLs, IADLs, rest and sleep, leisure, and social participation.   CO-MORBIDITIES: may have co-morbidities  that affects occupational performance. Patient will benefit from skilled OT to address above impairments and improve overall function.  MODIFICATION OR ASSISTANCE TO COMPLETE EVALUATION: Min-Moderate modification of tasks or assist with assess necessary to complete an evaluation.  OT OCCUPATIONAL PROFILE AND HISTORY: Detailed assessment: Review of records and additional review of physical, cognitive, psychosocial history related to current functional performance.  CLINICAL DECISION MAKING: Moderate - several treatment options, min-mod task modification necessary  REHAB POTENTIAL: Good  EVALUATION COMPLEXITY: Moderate    PLAN:  OT FREQUENCY: 2x/week  OT DURATION: 12 weeks  PLANNED INTERVENTIONS: 97168 OT Re-evaluation, 97535 self  care/ADL training, 02889 therapeutic exercise, 97530 therapeutic activity, 97112 neuromuscular re-education, 97140 manual therapy, 97018 paraffin, 02960 fluidotherapy, 97010 moist heat, 97010 cryotherapy, 97034 contrast bath, 97032 electrical stimulation (manual), 97760 Orthotic Initial, passive range of motion, energy conservation, coping strategies training, patient/family education, and DME and/or AE instructions  RECOMMENDED OTHER SERVICES: PT   CONSULTED AND AGREED WITH PLAN OF CARE: Patient  PLAN FOR NEXT SESSION: Treatment  Inocente Blazing, MS, OTR/L   01/08/2024, 12:02 PM

## 2024-01-09 ENCOUNTER — Ambulatory Visit: Admitting: Physical Therapy

## 2024-01-09 ENCOUNTER — Ambulatory Visit

## 2024-01-09 DIAGNOSIS — R296 Repeated falls: Secondary | ICD-10-CM

## 2024-01-09 DIAGNOSIS — M6281 Muscle weakness (generalized): Secondary | ICD-10-CM

## 2024-01-09 DIAGNOSIS — R262 Difficulty in walking, not elsewhere classified: Secondary | ICD-10-CM

## 2024-01-09 DIAGNOSIS — R278 Other lack of coordination: Secondary | ICD-10-CM

## 2024-01-09 DIAGNOSIS — R269 Unspecified abnormalities of gait and mobility: Secondary | ICD-10-CM

## 2024-01-09 DIAGNOSIS — R2689 Other abnormalities of gait and mobility: Secondary | ICD-10-CM

## 2024-01-09 NOTE — Therapy (Signed)
 OUTPATIENT PHYSICAL THERAPY NEURO TREATMENT   Patient Name: Erika Stanton MRN: [id number] DOB:December 01, 1955, 68 y.o., female Today's Date: 01/09/2024   PCP: Dr. Oneil Galloway  REFERRING PROVIDER: Dr. Oneil Galloway  END OF SESSION:  PT End of Session - 01/09/24 1322     Visit Number 9    Number of Visits 24    Date for PT Re-Evaluation 03/03/24    Progress Note Due on Visit 10    PT Start Time 1322    PT Stop Time 1400    PT Time Calculation (min) 38 min    Equipment Utilized During Treatment Gait belt    Activity Tolerance Patient tolerated treatment well    Behavior During Therapy WFL for tasks assessed/performed           Past Medical History:  Diagnosis Date   Anatomical narrow angle, bilateral    Aneurysm (HCC)    Asthma    Cerebral aneurysm    Cerebral vasospasm    Cognitive change    Dysphagia, post-stroke    GERD (gastroesophageal reflux disease)    Hemorrhoids    History of cervical dysplasia    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 no recurrent polyps.  suggest repeat surveillance study 11/2022.     COLONOSCOPY W/ POLYPECTOMY  06/2014   Dr Elder at [facility].  3 adenomatoous polyps, anal fissure.     ESOPHAGOGASTRODUODENOSCOPY (EGD) WITH PROPOFOL  N/A 01/27/2020   Procedure: ESOPHAGOGASTRODUODENOSCOPY (EGD) WITH PROPOFOL ;  Surgeon: Shila Gustav GAILS, MD;  Location: [facility] END[facility];  Service: Endoscopy;  Laterality: N/A;   INSERTION OF PERMANENT INTRAPERITONEAL CANNULA/CATH N/A    LAPAROSCOPIC   IR 3D INDEPENDENT WKST  01/06/2020    IR ANGIO INTRA EXTRACRAN SEL INTERNAL CAROTID BILAT MOD SED  01/06/2020   IR ANGIO VERTEBRAL SEL VERTEBRAL UNI L MOD SED  01/06/2020   IR ANGIOGRAM FOLLOW UP STUDY  01/06/2020   IR ANGIOGRAM FOLLOW UP STUDY  01/06/2020   IR ANGIOGRAM FOLLOW UP STUDY  01/06/2020   IR ANGIOGRAM FOLLOW UP STUDY  01/06/2020   IR ANGIOGRAM FOLLOW UP STUDY  01/06/2020   IR ANGIOGRAM FOLLOW UP STUDY  01/06/2020   IR ANGIOGRAM FOLLOW UP STUDY  01/06/2020   IR ANGIOGRAM FOLLOW UP STUDY  01/06/2020   IR ANGIOGRAM FOLLOW UP STUDY  01/06/2020   IR ANGIOGRAM FOLLOW UP STUDY  01/06/2020   IR NEURO EACH ADD'L AFTER BASIC UNI RIGHT (MS)  01/06/2020   IR TRANSCATH/EMBOLIZ  01/06/2020   MASTECTOMY     MOHS SURGERY N/A    RADIOLOGY WITH ANESTHESIA N/A 01/06/2020   Procedure: IR WITH ANESTHESIA FOR ANEURYSM;  Surgeon: Lanis Pupa, MD;  Location: [facility] OR;  Service: Radiology;  Laterality: N/A;   STENT SUPPORTED EMBOLIZATION OF RIGHT ICA ANEURYSM Right    TUBAL LIGATION     VENTRICULO-PERITONEAL SHUNT PLACEMENT / LAPAROSCOPIC INSERTION PERITONEAL CATHETER N/A    Patient Active Problem List   Diagnosis Date Noted   Dyspareunia  in female 04/05/2020   History of abnormal cervical Pap smear 03/10/2020   GIB (gastrointestinal bleeding) 02/28/2020   Elevated BUN    Prediabetes    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   Cerebral vasospasm    Sinus tachycardia    Dysphagia, post-stroke    Thrombocytopenia (HCC)    Acute blood loss anemia    Brain aneurysm    ICH (intracerebral hemorrhage) (HCC) 01/05/2020   History of adenomatous polyp of colon 02/07/2016   Anatomical narrow angle, bilateral 12/14/2014   Fissure in ano 11/11/2014   Hemorrhoids 11/11/2014   Constipation 11/19/2013   GERD (gastroesophageal reflux disease) 03/24/2013   Irritable bowel syndrome with diarrhea 03/24/2013   Herpes zoster 05/14/2005   Abnormal Pap smear of cervix 05/15/1983   History of cervical dysplasia 05/15/1983    ONSET  DATE: 12/02/2023  REFERRING DIAG: I63.9 (ICD-10-CM) - CVA (cerebral vascular accident) (HCC)   THERAPY DIAG:  Muscle weakness (generalized)  Other lack of coordination  Abnormality of gait and mobility  Difficulty in walking, not elsewhere classified  Other abnormalities of gait and mobility  Repeated falls  Rationale for Evaluation and Treatment: Rehabilitation  SUBJECTIVE:                                                                                                                                                                                             SUBJECTIVE STATEMENT:  Pt becomes tearful and states yesterday she went out to eat and she couldn't get up out of the booth at the restaurant, despite having been to that restaurant several times before. Pt states it scared her that she wasn't able to get out of the seat and caused her to be concern she was having a set back.  Pt states she then had a fear of stepping off of the curb at the restaurant so she used her husband's support whereas normally she can do it mod-I using her cane. Pt states it made her anxious and scared that this happened. Therapist providing emotional support and encouragement with plan to address these concerns during session today.  Pt states she tries to do her exercises including sit to stands at home. Pt also states she is trying to get into speech therapy.  Pt states in her house she will walk without the cane, except at night when she gets up to go to bathroom, she will use the cane.   From EVAL:Patient reports she was in hospital from [facility]- Sat (July 21st- 25th) with CVA affecting left side. I was supposed to have a knee replacement surgery in September but that is on hold  for now. I was doing my prep for my colonoscopy and fell in the bathroom.  Patient reports having mild left sided weakness compared to her CVA in 2021 with heavy Right sided UE weakness. She reports bad OA affecting both knees  as well.    Pt accompanied by: Husband brought patient  PERTINENT HISTORY: Previous CVA in 2021 affecting Right side. Reports going to ED  last Monday due to fall/CVA affecting Left side. Diagnosed with R CVA with Left Sided weakness.    PAIN:  Are you having pain? Yes: NPRS scale: Current = 1/10; worst= 6/10 Pain location: Bilateral knees- Arthritis  Pain description: achy yet sharp with weight bearing Aggravating factors: Increased walking, transfers Relieving factors: rest  PRECAUTIONS: Fall  RED FLAGS: None   WEIGHT BEARING RESTRICTIONS: No  FALLS: Has patient fallen in last 6 months? Yes. Number of falls fell with the stroke  LIVING ENVIRONMENT: Lives with: lives with their spouse Lives in: House/apartment Stairs: Yes: External: 2 steps; on left going up Has following equipment at home: Single point cane, Walker - 2 wheeled, shower chair, and Grab bars  PLOF: Independent- Retired from [employer]  PATIENT GOALS: Walk without any assistive device.  OBJECTIVE:  Note: Objective measures were completed at Evaluation unless otherwise noted.  DIAGNOSTIC FINDINGS: MRI BRAIN WITHOUT CONTRAST  INDICATION: Neuro deficit, acute, stroke suspected, left sided facial weakness, I63.9 Cerebral infarction, unspecified (CMS/HHS-HCC)  COMPARISON: December 02, 2023 CTA head and neck  TECHNIQUE/PROTOCOL: Standard adult protocol brain MR without contrast.  FINDINGS: Brain Parenchyma: Multifocal areas of restricted diffusion within the right cerebral hemisphere; for example, within the right periventricular white matter (series 6 image 47), right posterior temporal lobe (series 6 image 44), inferior right temporal lobe (series 6 image 41), and right parietal lobe (series 6 image 50). No midline shift. Susceptibility artifact from left frontal approach ventriculostomy shunt catheter obscures a majority of the left hemisphere and susceptibility artifact from right ICA terminus coils portions of  the right temporal lobe. Encephalomalacia around the coil pack and left frontal parietal lobe, unchanged. Area of FLAIR hyperintensity in the right cerebellar peduncle, likely representing wallerian degeneration. Scattered areas of increased T2/FLAIR signal intensity in the periventricular white matter, non-specific but likely the sequela of chronic small vessel ischemic disease. Ventricles and Sulci: Stable appearance of shunted ventricular system with left frontal approach ventriculostomy catheter with tip terminating near the septum pellucidum. Extra-Axial Spaces: No extra-axial fluid collection. Basal Cisterns: Normal. Intracranial Flow-Voids: Normal.  Paranasal Sinuses: Non-opacified. Mastoids: Normal. Orbits: Normal. Cranium: Multiple burr holes. Visualized upper cervical spine: No high grade stenosis.   IMPRESSION: 1. Multifocal acute infarcts within the right MCA territory, as above. Distribution may reflect thromboembolic etiology from the more proximal right MCA. 2. Please note that considerable susceptibility artifact from left frontal approach ventriculostomy shunt catheter obscures a majority of the left hemisphere limiting evaluation and precluding assessment for infarct in these locations.  Findings were discussed communicated via Epic secure chat with Devora MD by Lang Payment, MD at 12/03/2023 12:50 AM.  The preliminary report (critical or emergent communication) was reviewed prior to this dictation and there are no substantial differences between the preliminary results and the impressions in this final report.  Electronically Reviewed by:  Metta Fruits, MD, [facility] Electronically Reviewed on:  12/03/2023 11:11 AM  I have reviewed the images and concur with the above findings.  Electronically Signed by:  Evalene Bigger, MD, [facility] Electronically Signed on:  12/03/2023 5:30 PM Procedure Note  Amrhein, Evalene Agent, MD - 12/03/2023 Formatting of  this note might be different from the original. MRI BRAIN WITHOUT CONTRAST  INDICATION: Neuro deficit, acute, stroke suspected, left sided facial weakness, I63.9 Cerebral infarction, unspecified (CMS/HHS-HCC)  COMPARISON: December 02, 2023 CTA head and neck  TECHNIQUE/PROTOCOL: Standard adult protocol brain MR without contrast.  FINDINGS: Brain Parenchyma: Multifocal areas of restricted diffusion within the right cerebral hemisphere; for example, within the right periventricular white matter (series 6 image 47), right posterior temporal lobe (series 6 image 44), inferior right temporal lobe (series 6 image 41), and right parietal lobe (series 6 image 50). No midline shift. Susceptibility artifact from left frontal approach ventriculostomy shunt catheter obscures a majority of the left hemisphere and susceptibility artifact from right ICA terminus coils portions of the right temporal lobe. Encephalomalacia around the coil pack and left frontal parietal lobe, unchanged. Area of FLAIR hyperintensity in the right cerebellar peduncle, likely representing wallerian degeneration. Scattered areas of increased T2/FLAIR signal intensity in the periventricular white matter, non-specific but likely the sequela of chronic small vessel ischemic disease. Ventricles and Sulci: Stable appearance of shunted ventricular system with left frontal approach ventriculostomy catheter with tip terminating near the septum pellucidum. Extra-Axial Spaces: No extra-axial fluid collection. Basal Cisterns: Normal. Intracranial Flow-Voids: Normal.  Paranasal Sinuses: Non-opacified. Mastoids: Normal. Orbits: Normal. Cranium: Multiple burr holes. Visualized upper cervical spine: No high grade stenosis.   IMPRESSION: 1. Multifocal acute infarcts within the right MCA territory, as above. Distribution may reflect thromboembolic etiology from the more proximal right MCA. 2. Please note that considerable  susceptibility artifact from left frontal approach ventriculostomy shunt catheter obscures a majority of the left hemisphere limiting evaluation and precluding assessment for infarct in these locations.  Findings were discussed communicated via Epic secure chat with Devora MD by Lang Payment, MD at 12/03/2023 12:50 AM.  The preliminary report (critical or emergent communication) was reviewed prior to this dictation and there are no substantial differences between the preliminary results and the impressions in this final report.  Electronically Reviewed by:  Metta Fruits, MD, [facility] Electronically Reviewed on:  12/03/2023 11:11 AM  I have reviewed the images and concur with the above findings.  Electronically Signed by:  Evalene Bigger, MD, [facility] Electronically Signed on:  12/03/2023 5:30 PM Exam End: 12/03/23 00:10   Specimen Collected: 12/02/23 Last Resulted: 12/03/23 17:30  Received From: [facility]  Result Received: 12/09/23 16:07     COGNITION: Overall cognitive status: Within functional limits for tasks assessed   SENSATION: WFL  COORDINATION: Impaired - delayed each LE  EDEMA:  None observed   POSTURE: rounded shoulders and forward head  LOWER EXTREMITY ROM:     Active  Right Eval Left Eval  Hip flexion    Hip extension    Hip abduction    Hip adduction    Hip internal rotation    Hip external rotation    Knee flexion    Knee extension    Ankle dorsiflexion    Ankle plantarflexion    Ankle inversion    Ankle eversion     (Blank rows = not tested)  LOWER EXTREMITY MMT:    MMT Right Eval Left Eval  Hip flexion 4 4  Hip extension    Hip abduction    Hip adduction    Hip internal rotation 2- 2-  Hip external rotation 3+ 3+  Knee flexion 3+ 3+  Knee extension 4 4  Ankle dorsiflexion 4 4  Ankle  plantarflexion    Ankle inversion    Ankle eversion    (Blank rows = not tested)  BED MOBILITY:  Not  tested  TRANSFERS: Sit to stand: CGA  Assistive device utilized: Environmental consultant - 2 wheeled     Stand to sit: CGA  Assistive device utilized: Environmental consultant - 2 wheeled      RAMP:  Not tested  CURB:  Not tested  STAIRS: Not tested GAIT: Findings: Gait Characteristics: step to pattern, decreased arm swing- Right, decreased arm swing- Left, decreased step length- Right, decreased step length- Left, decreased stance time- Right, decreased stance time- Left, decreased stride length, decreased hip/knee flexion- Right, decreased hip/knee flexion- Left, decreased ankle dorsiflexion- Right, decreased ankle dorsiflexion- Left, wide BOS, poor foot clearance- Right, and poor foot clearance- Left, Distance walked: 60 feet, Assistive device utilized:Walker - 2 wheeled, Level of assistance: CGA, and Comments:    FUNCTIONAL TESTS:  5 times sit to stand: 25.20 sec with LUE Support Timed up and go (TUG): 40 sec with RW (increased time to place right hand on walker)  6 minute walk test: 482' 10 meter walk test: 0.65 m/s Berg Balance Scale: 42/56    PATIENT SURVEYS:  Stroke Impact Scale - 65.63%                                                                                                                             TREATMENT DATE: 01/09/24  Patient arrives to session in transport chair.  Unless otherwise stated, CGA/light min A was provided and gait belt donned in order to ensure pt safety throughout session.  L stand pivot transport chair>green chair with CGA using UE support on armrests  Gait training 113ft using SPC in L UE support progressed to no UE support after ~25ft with CGA for steadying. Pt demonstrating the following gait deviations with therapist providing the described cuing and facilitation for improvement:  Bilateral knees partially flexed throughout stance phases of gait Slow gait speed Reciprocal stepping pattern, but with fatigue has decreased L LE foot clearance during swing Mild excessive  hip/trunk forward flexion  B LE functional strengthening via sit<>stands with airex pad in seat to elevate floor to seat height 2x 10 reps - able to complete successfully without UE support and only close SBA  Gait training 143ft, no UE support, with CGA for safety/steadying.  Noticed slight postural sway/path deviation towards end as she becomes fatigued; otherwise, same impairments as noted above   Stair navigation training ascending/descending 4 steps x2 (6 height) using L UE support on each HR with CGA/light min assist for balance Step-to pattern leading with R LE on ascent and L LE on descent  During 2nd trial performed 1 step using SPC support to simulate a curb and patient required heavier min A for balance and support   Block practice curb step practice using SPC and green step with 1x purple plate x4 reps - forward step-up and forward step down -  requires min A progressed towards CGA for steadying - stepping up leading with R LE and down leading with L LE - requires mod progressed to min cuing for sequencing SPC placement.   Transported to next therapy session.    PATIENT EDUCATION: Education details: PT plan of care; Discussion of symptoms of CVA and to go to ED as soon as possible. Purpose of PT for balance, strength, coordination and endurance.  Person educated: Patient Education method: Explanation Education comprehension: verbalized understanding  HOME EXERCISE PROGRAM: Access Code: [email or web address] URL: [email or web address] Date: 12/17/2023 Prepared by: Sidra Simpers  Program Notes **Be sure to perform all exercises next to a countertop or sturdy piece of furniture in case you become unsteady.  Exercises - Standing Heel Raise with Support  - 1 x daily - 7 x weekly - 2 sets - 15 reps - Standing Toe Raises at Chair  - 1 x daily - 7 x weekly - 2 sets - 15 reps - Mini Squat with Counter Support  - 1 x daily - 7 x weekly - 2 sets - 15 reps - Lunge with  Counter Support  - 1 x daily - 7 x weekly - 2 sets - 15 reps - Standing Tandem Balance with Counter Support  - 1 x daily - 7 x weekly - 2 sets - 2 reps - 30 hold - Standing Single Leg Stance with Counter Support  - 1 x daily - 7 x weekly - 2 sets - 2 reps - 30 hold  GOALS: Goals reviewed with patient? Yes  SHORT TERM GOALS: Target date: 01/21/2024  Pt will be independent with HEP in order to improve strength and balance in order to decrease fall risk and improve function at home.  Baseline: EVAL - No current formal HEP in place Goal status: INITIAL   LONG TERM GOALS: Target date: 03/03/2024  1.  Patient will complete five times sit to stand test in < 15 seconds indicating an increased LE strength and improved balance. Baseline: EVAL= 25.20 sec with LUE Support Goal status: INITIAL   2.  Patient will increase Berg Balance score by > 6 points to demonstrate decreased fall risk during functional activities. Baseline: EVAL: To be assessed next visit; 12/12/2023= 42/56 Goal status: INITIAL   3.  Patient will reduce timed up and go to <11 seconds to reduce fall risk and demonstrate improved transfer/gait ability. Baseline: EVAL = 40 sec with RW Goal status: INITIAL  4.  Patient will increase 10 meter walk test to >1.91m/s as to improve gait speed for better community ambulation and to reduce fall risk. Baseline: EVAL: 0.65 m/s Goal status: INITIAL  5. Patient will increase six minute walk test distance by 200 feet for progression to community ambulator and improve gait ability Baseline: EVAL= 482' Goal status: INITIAL   ASSESSMENT:  CLINICAL IMPRESSION: Patient arrived to session upset due to concern she had had a set back due to inability to come to standing from the booth seat of a restaurant she frequently visits. Therapy session focused on addressing patient's stated concerns of being able to ambulate longer distances, come to stand independently, and safely navigate a curb step.  Patient participated in longer distance gait training without use of SPC today demonstrating only minor instability towards end as she fatigues; although does demo slow gait speed with downward gaze. Patient also participated in block practice curb step navigation using SPC with pt requiring mod progressed to min cuing for sequencing AD management and steps with  light min A for balance. Patient will benefit from continued encouragement and support of interventions that promote increased confidence with functional mobility tasks. Pt will continue to benefit from skilled therapy to address remaining deficits in order to improve overall QoL and return to PLOF.        OBJECTIVE IMPAIRMENTS: Abnormal gait, decreased activity tolerance, decreased balance, decreased coordination, decreased endurance, decreased mobility, difficulty walking, decreased ROM, decreased strength, impaired UE functional use, and pain.   ACTIVITY LIMITATIONS: carrying, lifting, bending, sitting, standing, squatting, sleeping, stairs, and transfers  PARTICIPATION LIMITATIONS: meal prep, cleaning, laundry, driving, shopping, community activity, and yard work  PERSONAL FACTORS: 1-2 comorbidities: OA, previous CVA with significant R sided weakness are also affecting patient's functional outcome.   REHAB POTENTIAL: Good  CLINICAL DECISION MAKING: Stable/uncomplicated  EVALUATION COMPLEXITY: Moderate  PLAN:  PT FREQUENCY: 1-2x/week  PT DURATION: 12 weeks  PLANNED INTERVENTIONS: 97164- PT Re-evaluation, 97750- Physical Performance Testing, 97110-Therapeutic exercises, 97530- Therapeutic activity, [id number]- Neuromuscular re-education, 97535- Self Care, [id number]- Manual therapy, [id number]- Gait training, [phone]- Orthotic Initial, [phone]- Orthotic/Prosthetic subsequent, [phone]- Canalith repositioning, [phone]- Electrical stimulation (manual), [phone] (1-2 muscles), [phone] (3+ muscles)- Dry Needling, Patient/Family education, Balance training, Stair  training, Taping, Joint mobilization, Joint manipulation, Spinal manipulation, Spinal mobilization, Compression bandaging, Vestibular training, DME instructions, Cryotherapy, and Moist heat  PLAN FOR NEXT SESSION:   *Progress Note*  Curb step navigation training  B LE functional strengthening Sit<>stands Step-ups  Gait training with LRAD Assess HEP compliance and progress as appropriate.   Connell Kiss, PT, DPT, NCS, CSRS Physical Therapist - [place]  [facility]  5:06 PM 01/09/24

## 2024-01-11 NOTE — Therapy (Signed)
 OUTPATIENT OCCUPATIONAL THERAPY NEURO TREATMENT NOTE  Patient Name: Erika Stanton MRN: [id number] DOB:08/29/1955, 68 y.o., female Today's Date: 01/11/2024  PCP: Laurice Anes, MD REFERRING PROVIDER: Laurice Anes, MD   OT End of Session - 01/11/24 1159     Visit Number 8    Number of Visits 24    Date for OT Re-Evaluation 03/03/24    OT Start Time 1400    OT Stop Time 1445    OT Time Calculation (min) 45 min    Activity Tolerance Patient tolerated treatment well    Behavior During Therapy WFL for tasks assessed/performed         Past Medical History:  Diagnosis Date   Anatomical narrow angle, bilateral    Aneurysm (HCC)    Asthma    Cerebral aneurysm    Cerebral vasospasm    Cognitive change    Dysphagia, post-stroke    GERD (gastroesophageal reflux disease)    Hemorrhoids    History of cervical dysplasia    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 no recurrent polyps.  suggest repeat surveillance study 11/2022.     COLONOSCOPY W/ POLYPECTOMY  06/2014   Dr Elder at [facility].  3 adenomatoous polyps, anal fissure.     ESOPHAGOGASTRODUODENOSCOPY (EGD) WITH PROPOFOL  N/A 01/27/2020   Procedure: ESOPHAGOGASTRODUODENOSCOPY (EGD) WITH PROPOFOL ;  Surgeon: Shila Gustav GAILS, MD;  Location: [facility] ENDOSCOPY;  Service: Endoscopy;  Laterality: N/A;   INSERTION OF PERMANENT INTRAPERITONEAL CANNULA/CATH N/A    LAPAROSCOPIC   IR 3D INDEPENDENT WKST  01/06/2020   IR ANGIO INTRA EXTRACRAN SEL INTERNAL CAROTID BILAT MOD SED  01/06/2020   IR ANGIO VERTEBRAL SEL  VERTEBRAL UNI L MOD SED  01/06/2020   IR ANGIOGRAM FOLLOW UP STUDY  01/06/2020   IR ANGIOGRAM FOLLOW UP STUDY  01/06/2020   IR ANGIOGRAM FOLLOW UP STUDY  01/06/2020   IR ANGIOGRAM FOLLOW UP STUDY  01/06/2020   IR ANGIOGRAM FOLLOW UP STUDY  01/06/2020   IR ANGIOGRAM FOLLOW UP STUDY  01/06/2020   IR ANGIOGRAM FOLLOW UP STUDY  01/06/2020   IR ANGIOGRAM FOLLOW UP STUDY  01/06/2020   IR ANGIOGRAM FOLLOW UP STUDY  01/06/2020   IR ANGIOGRAM FOLLOW UP STUDY  01/06/2020   IR NEURO EACH ADD'L AFTER BASIC UNI RIGHT (MS)  01/06/2020   IR TRANSCATH/EMBOLIZ  01/06/2020   MASTECTOMY     MOHS SURGERY N/A    RADIOLOGY WITH ANESTHESIA N/A 01/06/2020   Procedure: IR WITH ANESTHESIA FOR ANEURYSM;  Surgeon: Lanis Pupa, MD;  Location: [facility] OR;  Service: Radiology;  Laterality: N/A;   STENT SUPPORTED EMBOLIZATION OF RIGHT ICA ANEURYSM Right    TUBAL LIGATION     VENTRICULO-PERITONEAL SHUNT PLACEMENT / LAPAROSCOPIC INSERTION PERITONEAL CATHETER N/A    Patient Active Problem List   Diagnosis Date Noted   Dyspareunia in female 04/05/2020   History of abnormal cervical Pap smear 03/10/2020   GIB (gastrointestinal bleeding) 02/28/2020   Elevated BUN  Prediabetes    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   Cerebral vasospasm    Sinus tachycardia    Dysphagia, post-stroke    Thrombocytopenia (HCC)    Acute blood loss anemia    Brain aneurysm    ICH (intracerebral hemorrhage) (HCC) 01/05/2020   History of adenomatous polyp of colon 02/07/2016   Anatomical narrow angle, bilateral 12/14/2014   Fissure in ano 11/11/2014   Hemorrhoids 11/11/2014   Constipation 11/19/2013   GERD (gastroesophageal reflux disease) 03/24/2013   Irritable bowel syndrome with diarrhea 03/24/2013   Herpes zoster 05/14/2005   Abnormal Pap smear of cervix 05/15/1983   History of cervical dysplasia 05/15/1983   ONSET DATE: 12/02/2023  REFERRING DIAG: L ischemic CVA  THERAPY DIAG:  Muscle weakness  (generalized)  Other lack of coordination  Rationale for Evaluation and Treatment: Rehabilitation  SUBJECTIVE:  SUBJECTIVE STATEMENT: Pt reports she was able to wear her Softpro resting hand splint up to an hour yesterday. Pt accompanied by: self  PERTINENT HISTORY:  Pt. Was admitted to [facility] from 12/02/23-12/06/23 with an Acute Right MCA CVA, Hydrocephalus with shunt  placement. History of Subarachnoid Hemorrhage with bilateral ICA coiled aneurysms. 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  PRECAUTIONS: None  WEIGHT BEARING RESTRICTIONS: No  PAIN: 01/09/24: 4/10 pain bilat knees  Are you having pain? No  FALLS: Has patient fallen in last 6 months? Yes. Number of falls 1  LIVING ENVIRONMENT: Lives with: lives with their family and lives with their spouse Lives in: House/apartment Stairs: hand rails, 2 steps   Has following equipment at home: [name], cane  PLOF: Independent with basic ADLs, Spouse help as needed  PATIENT GOALS: Strengthening  OBJECTIVE:  Note: Objective measures were completed at Evaluation unless otherwise noted.  HAND DOMINANCE: Left  ADLs: Overall ADLs: spouse assists when needed, Pt. reports that she can do most tasks herself. Uses her left hand to engage in self-care tasks. Eating: Able to cut foods with a fork, husband assists as needed.  Grooming: husband assists with applying toothpaste, able to brush teeth and hair UB Dressing: Spouse assists with applying/fastening bra, Pt. Requires assistance with zippers, and buttons. LB Dressing: Spouse assists with tying shoes when needed, uses slip on shoes. Pt. Does not zip pants. Toileting: Able to complete independently Bathing: Pt.  Requires intermittent assistance with bathing tasks. Tub Shower transfers: Spouse assists with transfers when/if needed Equipment: Shower seat with back  IADLs: Shopping: Spouse typically does the shopping, isnt able to carry heavy objects.  Light housekeeping: Spouse typically does most of the cleaning around the home, Pt. Cleans bathroom, and laundry Pt. Is unable to carry laundry due to exacerbation of weakness.  Meal Prep: Spouse typically cooks and prepares meals.  Community mobility: Pt. Requires assistance using walker. Pt. reports that she is unable to carry heavy items, and typically does not do the shopping.  Medication management: Pt. is able to take care of meds with pillbox Financial management:  Spouse takes care of it, and makes online payments.  Handwriting: 50% legible  MOBILITY STATUS: Needs Assist: Pt. Requires use of a walker   POSTURE COMMENTS:  No Significant postural limitations Sitting balance: Good  ACTIVITY TOLERANCE: Activity tolerance:   FUNCTIONAL OUTCOME MEASURES: TBD  UPPER EXTREMITY ROM:  Active ROM Right eval Left eval  Shoulder flexion 94(121) 102(125)  Shoulder abduction 75(106) 110(126)  Shoulder adduction    Shoulder extension    Shoulder internal rotation    Shoulder external rotation    Elbow flexion 135(155) 140(150)  Elbow extension 0 -10(-9)  Wrist flexion 40(44) 42(58)  Wrist extension 30(34) 40(50)  Wrist ulnar deviation    Wrist radial deviation    Wrist pronation    Wrist supination    (Blank rows = not tested)  UPPER EXTREMITY MMT:     MMT Right eval Left eval  Shoulder flexion 3-/5 3-/5  Shoulder abduction 3-/5 3-/5  Shoulder adduction    Shoulder extension    Shoulder internal rotation    Shoulder external rotation    Middle trapezius    Lower trapezius    Elbow flexion 4-/5 4+/5  Elbow extension 4-/5 4+/5  Wrist flexion 4-/5 3+/5  Wrist extension 4-/5 3+/5  Wrist ulnar deviation    Wrist radial  deviation    Wrist pronation    Wrist supination    (Blank rows = not tested)  HAND FUNCTION: Grip strength: Right: N/T lbs; Left: 43 lbs, Lateral pinch: Right: N/T lbs, Left: 9 lbs, and 3 point pinch: Right: N/T lbs, Left: 5 lbs Pt. Presents with flexor tightness in the right 5th digit, however is able to passively extend 5th digit within normal range. Pt. Also Presents with hyper extension in the 2nd and 3rd digit at PIP joints. Pt. Is able to achieve full Digit MP, PIP, and DIP PROM.  COORDINATION: 9 Hole Peg test: Right: N/T sec; Left: 51 sec  SENSATION: Not tested  EDEMA:   MUSCLE TONE: Fluctuating flexor tone in R hand.  COGNITION: Overall cognitive status: Within functional limits for tasks assessed  VISION: Subjective report:  Baseline vision:  Visual history:   VISION ASSESSMENT:  PERCEPTION:   PRAXIS:   OBSERVATIONS: Pt. presents with fluctuating flexor tone in the R hand, involving the 5th digit at the PIP/DIP joint. Pt. Presents with hyperextension at the 2nd and 3rd digits at the PIP joints.                                                                                                                    TREATMENT DATE: 01/09/2024 Therapeutic Exercises: -Facilitated L hand grip strengthening with hand gripper set at moderate resistance with 2 red bands: 2 sets 10 reps each, requiring initial min guard to maintain grasp of hand gripper, progressing with vc only  -Facilitated L hand pinch strengthening using therapy resistant clothes pins to target lateral and 3 point pinching, 2-3 sets for each pinch type.   -Gentle passive wrist and digit extension stretching for R hand, and passive digit abd to reduce contracture risk and promote healthy skin integrity.  Self Care: -Review of splinting schedule recommendations for R hand splint -Education provided for RUE positioning during the day at rest and with activity, positioning RUE in supportive position on table top  with fingers  extended to tolerance.  OT placed small ball in hand during OT session to reduce digit flexion, as digits repeatedly came out of full extension on table top d/t increased tone while participating in L hand strengthening.  Moist heat also applied to R hand for tone reduction simultaneous to L handed activities.  PATIENT EDUCATION: Education details: splint wearing schedule, HEP review Person educated: Patient, spouse Education method: Explanation, Demonstration, Tactile cues, and Verbal cues Education comprehension: verbalized understanding and returned demonstration, further training needed  HOME EXERCISE PROGRAM: Pink theraputty; visual handout issued -Self and caregiver assisted PROM to R hand and wrist  GOALS: Goals reviewed with patient? Yes  SHORT TERM GOALS: Target date: 01/21/2024    Pt. Will be independent with HEP for UE functioning. Baseline: Eval:No current HEP Goal status: INITIAL  LONG TERM GOALS: Target date: 03/03/2024    Pt. Will improve [facility] skills to be able to be able to manipulate small objects at home Baseline: 9 hole peg test: L: 51 seconds Goal status: INITIAL  2.  Pt. Will increase BUE shoulder ROM by 10 degrees to be able to reach into cabinets and shelves.  Baseline: Shoulder flexion: R: 94(121), L: 102(125), Shoulder abduction: R: 75(106), L: 110(126) Goal status: INITIAL  3.  Pt. Will improve L grip strength by 5# to be able to securely grasp ADL items at home.  Baseline: Grip strength: R: NT, L: 43# Goal status: INITIAL  4.  Pt. Will increase L lateral key pinch strength by 3#  to open wide mouth jars.  Baseline: Lateral key pinch: R: NT, L: 9# Goal status: INITIAL  5.  Pt. Will independently engage the right hand as a gross assist to the left hand 100% of the time during ADLs/IADLs. Baseline: Eval: Pt. With limited engagement of the right hand as a gross assist during tasks.  Goal status: INITIAL  6.  Pt. Will  improve left hand  [facility] skills by 3 sec. of speed to be able to manipulate zippers, and buttons efficiently. Baseline: Eval:Right: NT Left: 51 sec.  Goal status: INITIAL  7. Pt. Will improve handwriting legibility to 100% in printed form for written correspondence.  Baseline: 50% legible in printed form for first name only. Goal status: INITIAL  ASSESSMENT:  CLINICAL IMPRESSION: Pt reports she was able to wear her Softpro resting hand splint up to an hour yesterday, noting improved tolerance to wearing after MP flexion angle was slightly increased last OT session.  Pt receptive to positioning strategies for R hand today during L hand strengthening activities, and responded well to ball in hand with moist hot pack wrapped around hand for muscle relaxation/tone reduction.  Pt continues to present with L hand weakness.  Pt was able to tolerate up to blue clips with repeat trials/increased effort when using a lateral pinch, and up to green clips when using 3 point pinch patterns.  Pt will benefit from a visual perceptual assessment of visual discrimination skills.  Pt. continues to benefit from OT services to increase BUE ROM in the shoulder, wrist and hands, as well as increasing L grip/pinch strength and improve [facility] skills to be able to perform her desired ADL/IADLs.   PERFORMANCE DEFICITS: in functional skills including ADLs, IADLs, coordination, dexterity, edema, tone, ROM, strength, pain, Fine motor control, Gross motor control, endurance, and UE functional use, and psychosocial skills including coping strategies, environmental adaptation, habits, and routines and behaviors.   IMPAIRMENTS: are limiting patient from ADLs, IADLs, rest and sleep, leisure, and  social participation.   CO-MORBIDITIES: may have co-morbidities  that affects occupational performance. Patient will benefit from skilled OT to address above impairments and improve overall function.  MODIFICATION OR ASSISTANCE TO COMPLETE EVALUATION: Min-Moderate  modification of tasks or assist with assess necessary to complete an evaluation.  OT OCCUPATIONAL PROFILE AND HISTORY: Detailed assessment: Review of records and additional review of physical, cognitive, psychosocial history related to current functional performance.  CLINICAL DECISION MAKING: Moderate - several treatment options, min-mod task modification necessary  REHAB POTENTIAL: Good  EVALUATION COMPLEXITY: Moderate    PLAN:  OT FREQUENCY: 2x/week  OT DURATION: 12 weeks  PLANNED INTERVENTIONS: 97168 OT Re-evaluation, 97535 self care/ADL training, 02889 therapeutic exercise, 97530 therapeutic activity, 97112 neuromuscular re-education, 97140 manual therapy, 97018 paraffin, 02960 fluidotherapy, 97010 moist heat, 97010 cryotherapy, 97034 contrast bath, 97032 electrical stimulation (manual), 97760 Orthotic Initial, passive range of motion, energy conservation, coping strategies training, patient/family education, and DME and/or AE instructions  RECOMMENDED OTHER SERVICES: PT   CONSULTED AND AGREED WITH PLAN OF CARE: Patient  PLAN FOR NEXT SESSION: Treatment  Inocente Blazing, MS, OTR/L   01/11/2024, 12:00 PM

## 2024-01-14 ENCOUNTER — Ambulatory Visit: Attending: Family Medicine | Admitting: Physical Therapy

## 2024-01-14 ENCOUNTER — Ambulatory Visit: Admitting: Occupational Therapy

## 2024-01-14 DIAGNOSIS — R296 Repeated falls: Secondary | ICD-10-CM | POA: Insufficient documentation

## 2024-01-14 DIAGNOSIS — R2689 Other abnormalities of gait and mobility: Secondary | ICD-10-CM | POA: Insufficient documentation

## 2024-01-14 DIAGNOSIS — R278 Other lack of coordination: Secondary | ICD-10-CM

## 2024-01-14 DIAGNOSIS — R269 Unspecified abnormalities of gait and mobility: Secondary | ICD-10-CM | POA: Insufficient documentation

## 2024-01-14 DIAGNOSIS — R41841 Cognitive communication deficit: Secondary | ICD-10-CM | POA: Insufficient documentation

## 2024-01-14 DIAGNOSIS — M6281 Muscle weakness (generalized): Secondary | ICD-10-CM | POA: Diagnosis present

## 2024-01-14 DIAGNOSIS — R262 Difficulty in walking, not elsewhere classified: Secondary | ICD-10-CM | POA: Insufficient documentation

## 2024-01-14 NOTE — Therapy (Signed)
 OUTPATIENT OCCUPATIONAL THERAPY NEURO TREATMENT NOTE  Patient Name: Erika Stanton MRN: [id number] DOB:1956/02/11, 68 y.o., female Today's Date: 01/14/2024  PCP: Laurice Anes, MD REFERRING PROVIDER: Laurice Anes, MD   OT End of Session - 01/14/24 1414     Visit Number 9    Number of Visits 24    Date for OT Re-Evaluation 03/03/24    OT Start Time 1015    OT Stop Time 1100    OT Time Calculation (min) 45 min    Activity Tolerance Patient tolerated treatment well    Behavior During Therapy WFL for tasks assessed/performed         Past Medical History:  Diagnosis Date   Anatomical narrow angle, bilateral    Aneurysm (HCC)    Asthma    Cerebral aneurysm    Cerebral vasospasm    Cognitive change    Dysphagia, post-stroke    GERD (gastroesophageal reflux disease)    Hemorrhoids    History of cervical dysplasia    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 no recurrent polyps.  suggest repeat surveillance study 11/2022.     COLONOSCOPY W/ POLYPECTOMY  06/2014   Dr Elder at [facility].  3 adenomatoous polyps, anal fissure.     ESOPHAGOGASTRODUODENOSCOPY (EGD) WITH PROPOFOL  N/A 01/27/2020   Procedure: ESOPHAGOGASTRODUODENOSCOPY (EGD) WITH PROPOFOL ;  Surgeon: Shila Gustav GAILS, MD;  Location: [facility];  Service: Endoscopy;  Laterality: N/A;   INSERTION OF PERMANENT INTRAPERITONEAL CANNULA/CATH N/A    LAPAROSCOPIC   IR 3D INDEPENDENT WKST  01/06/2020   IR ANGIO INTRA EXTRACRAN SEL INTERNAL CAROTID BILAT MOD SED  01/06/2020   IR ANGIO VERTEBRAL SEL  VERTEBRAL UNI L MOD SED  01/06/2020   IR ANGIOGRAM FOLLOW UP STUDY  01/06/2020   IR ANGIOGRAM FOLLOW UP STUDY  01/06/2020   IR ANGIOGRAM FOLLOW UP STUDY  01/06/2020   IR ANGIOGRAM FOLLOW UP STUDY  01/06/2020   IR ANGIOGRAM FOLLOW UP STUDY  01/06/2020   IR ANGIOGRAM FOLLOW UP STUDY  01/06/2020   IR ANGIOGRAM FOLLOW UP STUDY  01/06/2020   IR ANGIOGRAM FOLLOW UP STUDY  01/06/2020   IR ANGIOGRAM FOLLOW UP STUDY  01/06/2020   IR ANGIOGRAM FOLLOW UP STUDY  01/06/2020   IR NEURO EACH ADD'L AFTER BASIC UNI RIGHT (MS)  01/06/2020   IR TRANSCATH/EMBOLIZ  01/06/2020   MASTECTOMY     MOHS SURGERY N/A    RADIOLOGY WITH ANESTHESIA N/A 01/06/2020   Procedure: IR WITH ANESTHESIA FOR ANEURYSM;  Surgeon: Lanis Pupa, MD;  Location: [facility] OR;  Service: Radiology;  Laterality: N/A;   STENT SUPPORTED EMBOLIZATION OF RIGHT ICA ANEURYSM Right    TUBAL LIGATION     VENTRICULO-PERITONEAL SHUNT PLACEMENT / LAPAROSCOPIC INSERTION PERITONEAL CATHETER N/A    Patient Active Problem List   Diagnosis Date Noted   Dyspareunia in female 04/05/2020   History of abnormal cervical Pap smear 03/10/2020   GIB (gastrointestinal bleeding) 02/28/2020   Elevated BUN  Prediabetes    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   Cerebral vasospasm    Sinus tachycardia    Dysphagia, post-stroke    Thrombocytopenia (HCC)    Acute blood loss anemia    Brain aneurysm    ICH (intracerebral hemorrhage) (HCC) 01/05/2020   History of adenomatous polyp of colon 02/07/2016   Anatomical narrow angle, bilateral 12/14/2014   Fissure in ano 11/11/2014   Hemorrhoids 11/11/2014   Constipation 11/19/2013   GERD (gastroesophageal reflux disease) 03/24/2013   Irritable bowel syndrome with diarrhea 03/24/2013   Herpes zoster 05/14/2005   Abnormal Pap smear of cervix 05/15/1983   History of cervical dysplasia 05/15/1983   ONSET DATE: 12/02/2023  REFERRING DIAG: L ischemic CVA  THERAPY DIAG:  Muscle weakness  (generalized)  Other lack of coordination  Rationale for Evaluation and Treatment: Rehabilitation  SUBJECTIVE:  SUBJECTIVE STATEMENT: Pt. reports still being sad about not being able to have her knee replacement for 9 months. Pt accompanied by: self  PERTINENT HISTORY:  Pt. Was admitted to [facility] from 12/02/23-12/06/23 with an Acute Right MCA CVA, Hydrocephalus with shunt  placement. History of Subarachnoid Hemorrhage with bilateral ICA coiled aneurysms. 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  PRECAUTIONS: None  WEIGHT BEARING RESTRICTIONS: No  PAIN:  01/14/24: No reports of pain 01/09/24: 4/10 pain bilat knees  Are you having pain? No  FALLS: Has patient fallen in last 6 months? Yes. Number of falls 1  LIVING ENVIRONMENT: Lives with: lives with their family and lives with their spouse Lives in: House/apartment Stairs: hand rails, 2 steps   Has following equipment at home: [name], cane  PLOF: Independent with basic ADLs, Spouse help as needed  PATIENT GOALS: Strengthening  OBJECTIVE:  Note: Objective measures were completed at Evaluation unless otherwise noted.  HAND DOMINANCE: Left  ADLs: Overall ADLs: spouse assists when needed, Pt. reports that she can do most tasks herself. Uses her left hand to engage in self-care tasks. Eating: Able to cut foods with a fork, husband assists as needed.  Grooming: husband assists with applying toothpaste, able to brush teeth and hair UB Dressing: Spouse assists with applying/fastening bra, Pt. Requires assistance with zippers, and buttons. LB Dressing: Spouse assists with tying shoes when needed, uses slip on shoes. Pt. Does not zip pants. Toileting: Able to  complete independently Bathing: Pt. Requires intermittent assistance with bathing tasks. Tub Shower transfers: Spouse assists with transfers when/if needed Equipment: Shower seat with back  IADLs: Shopping: Spouse typically does the shopping, isnt able to carry heavy objects.  Light housekeeping: Spouse typically does most of the cleaning around the home, Pt. Cleans bathroom, and laundry Pt. Is unable to carry laundry due to exacerbation of weakness.  Meal Prep: Spouse typically cooks and prepares meals.  Community mobility: Pt. Requires assistance using walker. Pt. reports that she is unable to carry heavy items, and typically does not do the shopping.  Medication management: Pt. is able to take care of meds with pillbox Financial management:  Spouse takes care of it, and makes online payments.  Handwriting: 50% legible  MOBILITY STATUS: Needs Assist: Pt. Requires use of a walker   POSTURE COMMENTS:  No Significant postural limitations Sitting balance: Good  ACTIVITY TOLERANCE: Activity tolerance:   FUNCTIONAL OUTCOME MEASURES:  TBD  UPPER EXTREMITY ROM:    Active ROM Right eval Left eval  Shoulder flexion 94(121) 102(125)  Shoulder abduction 75(106) 110(126)  Shoulder adduction    Shoulder extension    Shoulder internal rotation    Shoulder external rotation    Elbow flexion 135(155) 140(150)  Elbow extension 0 -10(-9)  Wrist flexion 40(44) 42(58)  Wrist extension 30(34) 40(50)  Wrist ulnar deviation    Wrist radial deviation    Wrist pronation    Wrist supination    (Blank rows = not tested)  UPPER EXTREMITY MMT:     MMT Right eval Left eval  Shoulder flexion 3-/5 3-/5  Shoulder abduction 3-/5 3-/5  Shoulder adduction    Shoulder extension    Shoulder internal rotation    Shoulder external rotation    Middle trapezius    Lower trapezius    Elbow flexion 4-/5 4+/5  Elbow extension 4-/5 4+/5  Wrist flexion 4-/5 3+/5  Wrist extension 4-/5 3+/5  Wrist  ulnar deviation    Wrist radial deviation    Wrist pronation    Wrist supination    (Blank rows = not tested)  HAND FUNCTION: Grip strength: Right: N/T lbs; Left: 43 lbs, Lateral pinch: Right: N/T lbs, Left: 9 lbs, and 3 point pinch: Right: N/T lbs, Left: 5 lbs Pt. Presents with flexor tightness in the right 5th digit, however is able to passively extend 5th digit within normal range. Pt. Also Presents with hyper extension in the 2nd and 3rd digit at PIP joints. Pt. Is able to achieve full Digit MP, PIP, and DIP PROM.  COORDINATION: 9 Hole Peg test: Right: N/T sec; Left: 51 sec  SENSATION: Not tested  EDEMA:   MUSCLE TONE: Fluctuating flexor tone in R hand.  COGNITION: Overall cognitive status: Within functional limits for tasks assessed  VISION: Subjective report:  Baseline vision:  Visual history:   VISION ASSESSMENT:  PERCEPTION:   PRAXIS:   OBSERVATIONS: Pt. presents with fluctuating flexor tone in the R hand, involving the 5th digit at the PIP/DIP joint. Pt. Presents with hyperextension at the 2nd and 3rd digits at the PIP joints.                                                                                                                    TREATMENT DATE: 02/06/2024  Therapeutic Exercises:  -Pt. tolerated right hand PROM/AAROM through all joint ranges.  -Pt. Performed gross grip strengthening with the left hand using 2 resistive band strength of force.  Therapeutic Activities:  -Left hand lateral, and 3pt. pinch strengthening using yellow, red, green, and blue level resistive clips. -Incorporated a reaching component in combination with the task to further challenge distal motor control while moving the proximal UE.  -Facilitated Left hand [facility] skills grasping 1.5 discs, storing them in the hand, then moving them from the palm to th tip of the 2nd digit, and thumb in preparation for reaching up  to discard them through a slotted container. -Facilitated left  hand [facility] skills grasping dice, holding one at a time in the palm while simultaneously grasping another one with the 2nd digit, and thumb in preparation for stacking them into a vertical tower on the tabletop.  PATIENT EDUCATION: Education details: splint wearing schedule, HEP review Person educated: Patient, spouse Education method: Explanation, Demonstration, Tactile cues, and Verbal cues Education comprehension: verbalized understanding and returned demonstration, further training needed  HOME EXERCISE PROGRAM: Pink theraputty; visual handout issued -Self and caregiver assisted PROM to R hand and wrist  GOALS: Goals reviewed with patient? Yes  SHORT TERM GOALS: Target date: 01/21/2024    Pt. Will be independent with HEP for UE functioning. Baseline: Eval:No current HEP Goal status: INITIAL  LONG TERM GOALS: Target date: 03/03/2024    Pt. Will improve [facility] skills to be able to be able to manipulate small objects at home Baseline: 9 hole peg test: L: 51 seconds Goal status: INITIAL  2.  Pt. Will increase BUE shoulder ROM by 10 degrees to be able to reach into cabinets and shelves.  Baseline: Shoulder flexion: R: 94(121), L: 102(125), Shoulder abduction: R: 75(106), L: 110(126) Goal status: INITIAL  3.  Pt. Will improve L grip strength by 5# to be able to securely grasp ADL items at home.  Baseline: Grip strength: R: NT, L: 43# Goal status: INITIAL  4.  Pt. Will increase L lateral key pinch strength by 3#  to open wide mouth jars.  Baseline: Lateral key pinch: R: NT, L: 9# Goal status: INITIAL  5.  Pt. Will independently engage the right hand as a gross assist to the left hand 100% of the time during ADLs/IADLs. Baseline: Eval: Pt. With limited engagement of the right hand as a gross assist during tasks.  Goal status: INITIAL  6.  Pt. Will  improve left hand [facility] skills by 3 sec. of speed to be able to manipulate zippers, and buttons efficiently. Baseline: Eval:Right: NT  Left: 51 sec.  Goal status: INITIAL  7. Pt. Will improve handwriting legibility to 100% in printed form for written correspondence.  Baseline: 50% legible in printed form for first name only. Goal status: INITIAL  ASSESSMENT:  CLINICAL IMPRESSION:   Pt. was able to perform left hand pinch strengthening with yellow, red, green, and blue level resistance with cues for form, and technique required.  Pt. Required proximal assist to to reach out to place the discs through a slotted target.  Pt. was able create a vertical tower of 5 pieces. Pt. was able to discard the discs from the radial aspect of her hand. However,  Has difficulty discarding items from her thumb, and 2nd digit ultimately resorting to discarding them from the ulnar aspect of the hand. Pt. continues to benefit from a visual perceptual assessment of visual discrimination skills. Pt. continues to benefit from OT services to increase BUE ROM in the shoulder, wrist and hands, as well as increasing L grip/pinch strength and improve [employer] skills to be able to perform her desired ADL/IADLs.   PERFORMANCE DEFICITS: in functional skills including ADLs, IADLs, coordination, dexterity, edema, tone, ROM, strength, pain, Fine motor control, Gross motor control, endurance, and UE functional use, and psychosocial skills including coping strategies, environmental adaptation, habits, and routines and behaviors.   IMPAIRMENTS: are limiting patient from ADLs, IADLs, rest and sleep, leisure, and social participation.   CO-MORBIDITIES: may have co-morbidities  that affects occupational performance. Patient will benefit from skilled OT to address above impairments and improve overall function.  MODIFICATION OR  ASSISTANCE TO COMPLETE EVALUATION: Min-Moderate modification of tasks or assist with assess necessary to complete an evaluation.  OT OCCUPATIONAL PROFILE AND HISTORY: Detailed assessment: Review of records and additional review of physical,  cognitive, psychosocial history related to current functional performance.  CLINICAL DECISION MAKING: Moderate - several treatment options, min-mod task modification necessary  REHAB POTENTIAL: Good  EVALUATION COMPLEXITY: Moderate    PLAN:  OT FREQUENCY: 2x/week  OT DURATION: 12 weeks  PLANNED INTERVENTIONS: 97168 OT Re-evaluation, 97535 self care/ADL training, 02889 therapeutic exercise, 97530 therapeutic activity, 97112 neuromuscular re-education, 97140 manual therapy, 97018 paraffin, 02960 fluidotherapy, 97010 moist heat, 97010 cryotherapy, 97034 contrast bath, 97032 electrical stimulation (manual), 97760 Orthotic Initial, passive range of motion, energy conservation, coping strategies training, patient/family education, and DME and/or AE instructions  RECOMMENDED OTHER SERVICES: PT   CONSULTED AND AGREED WITH PLAN OF CARE: Patient  PLAN FOR NEXT SESSION: Treatment  Waldine Zenz, MS, OTR/L   01/14/2024, 2:19 PM

## 2024-01-14 NOTE — Therapy (Signed)
 OUTPATIENT PHYSICAL THERAPY NEURO TREATMENT/  PHYSICAL THERAPY PROGRESS NOTE   Dates of reporting period  12/10/23   to   01/14/2024     Patient Name: Erika Stanton MRN: [id number] DOB:December 05, 1955, 68 y.o., female Today's Date: 01/14/2024   PCP: Dr. Oneil Galloway  REFERRING PROVIDER: Dr. Oneil Galloway  END OF SESSION:  PT End of Session - 01/14/24 0939     Visit Number 10    Number of Visits 24    Date for PT Re-Evaluation 03/03/24    Progress Note Due on Visit 10    PT Start Time 0936    PT Stop Time 1015    PT Time Calculation (min) 39 min    Equipment Utilized During Treatment Gait belt    Activity Tolerance Patient tolerated treatment well    Behavior During Therapy WFL for tasks assessed/performed           Past Medical History:  Diagnosis Date   Anatomical narrow angle, bilateral    Aneurysm (HCC)    Asthma    Cerebral aneurysm    Cerebral vasospasm    Cognitive change    Dysphagia, post-stroke    GERD (gastroesophageal reflux disease)    Hemorrhoids    History of cervical dysplasia    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 no recurrent polyps.  suggest repeat surveillance study 11/2022.     COLONOSCOPY W/ POLYPECTOMY  06/2014   Dr Elder at [facility].  3 adenomatoous polyps, anal fissure.     ESOPHAGOGASTRODUODENOSCOPY (EGD) WITH PROPOFOL  N/A 01/27/2020   Procedure: ESOPHAGOGASTRODUODENOSCOPY (EGD) WITH PROPOFOL ;  Surgeon: Shila Gustav GAILS, MD;  Location: [facility] ENDOSCOPY;  Service: Endoscopy;  Laterality: N/A;   INSERTION OF  PERMANENT INTRAPERITONEAL CANNULA/CATH N/A    LAPAROSCOPIC   IR 3D INDEPENDENT WKST  01/06/2020   IR ANGIO INTRA EXTRACRAN SEL INTERNAL CAROTID BILAT MOD SED  01/06/2020   IR ANGIO VERTEBRAL SEL VERTEBRAL UNI L MOD SED  01/06/2020   IR ANGIOGRAM FOLLOW UP STUDY  01/06/2020   IR ANGIOGRAM FOLLOW UP STUDY  01/06/2020   IR ANGIOGRAM FOLLOW UP STUDY  01/06/2020   IR ANGIOGRAM FOLLOW UP STUDY  01/06/2020   IR ANGIOGRAM FOLLOW UP STUDY  01/06/2020   IR ANGIOGRAM FOLLOW UP STUDY  01/06/2020   IR ANGIOGRAM FOLLOW UP STUDY  01/06/2020   IR ANGIOGRAM FOLLOW UP STUDY  01/06/2020   IR ANGIOGRAM FOLLOW UP STUDY  01/06/2020   IR ANGIOGRAM FOLLOW UP STUDY  01/06/2020   IR NEURO EACH ADD'L AFTER BASIC UNI RIGHT (MS)  01/06/2020   IR TRANSCATH/EMBOLIZ  01/06/2020   MASTECTOMY     MOHS SURGERY N/A    RADIOLOGY WITH ANESTHESIA N/A 01/06/2020   Procedure: IR WITH ANESTHESIA FOR ANEURYSM;  Surgeon: Lanis Pupa, MD;  Location: [facility] OR;  Service: Radiology;  Laterality: N/A;   STENT SUPPORTED EMBOLIZATION OF RIGHT ICA ANEURYSM Right    TUBAL LIGATION     VENTRICULO-PERITONEAL SHUNT PLACEMENT /  LAPAROSCOPIC INSERTION PERITONEAL CATHETER N/A    Patient Active Problem List   Diagnosis Date Noted   Dyspareunia in female 04/05/2020   History of abnormal cervical Pap smear 03/10/2020   GIB (gastrointestinal bleeding) 02/28/2020   Elevated BUN    Prediabetes    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   Cerebral vasospasm    Sinus tachycardia    Dysphagia, post-stroke    Thrombocytopenia (HCC)    Acute blood loss anemia    Brain aneurysm    ICH (intracerebral hemorrhage) (HCC) 01/05/2020   History of adenomatous polyp of colon 02/07/2016   Anatomical narrow angle, bilateral 12/14/2014   Fissure in ano 11/11/2014   Hemorrhoids 11/11/2014   Constipation 11/19/2013   GERD (gastroesophageal reflux disease) 03/24/2013   Irritable bowel syndrome with diarrhea 03/24/2013   Herpes zoster 05/14/2005    Abnormal Pap smear of cervix 05/15/1983   History of cervical dysplasia 05/15/1983    ONSET DATE: 12/02/2023  REFERRING DIAG: I63.9 (ICD-10-CM) - CVA (cerebral vascular accident) (HCC)   THERAPY DIAG:  Muscle weakness (generalized)  Other lack of coordination  Abnormality of gait and mobility  Difficulty in walking, not elsewhere classified  Repeated falls  Other abnormalities of gait and mobility  Rationale for Evaluation and Treatment: Rehabilitation  SUBJECTIVE:                                                                                                                                                                                             SUBJECTIVE STATEMENT:  Pt reports that she is doing well. Had a good holiday weekend.  Has a little soreness in the R knee. Does not rate.  Is continuing to perform HEP daily at home. Reports no issues with current HEP  Has not been out to eat since issues with getting out of seat in booth last week prior to last PT session.   From EVAL:Patient reports she was in hospital from [facility]- Sat (July 21st- 25th) with CVA affecting left side. I was supposed to have a knee replacement surgery in September but that is on hold for now. I was doing my prep for my colonoscopy and fell in the bathroom.  Patient reports having mild left sided weakness compared to her CVA in 2021 with heavy Right sided UE weakness. She reports bad OA affecting both knees as well.    Pt accompanied by: Husband brought patient  PERTINENT HISTORY: Previous CVA in 2021 affecting Right side. Reports going to ED  last Monday due to fall/CVA affecting Left side. Diagnosed with R CVA with Left Sided weakness.    PAIN:  Are  you having pain? Yes: NPRS scale: Current = 1/10; worst= 6/10 Pain location: Bilateral knees- Arthritis  Pain description: achy yet sharp with weight bearing Aggravating factors: Increased walking, transfers Relieving factors:  rest  PRECAUTIONS: Fall  RED FLAGS: None   WEIGHT BEARING RESTRICTIONS: No  FALLS: Has patient fallen in last 6 months? Yes. Number of falls fell with the stroke  LIVING ENVIRONMENT: Lives with: lives with their spouse Lives in: House/apartment Stairs: Yes: External: 2 steps; on left going up Has following equipment at home: Single point cane, Walker - 2 wheeled, shower chair, and Grab bars  PLOF: Independent- Retired from [employer]  PATIENT GOALS: Walk without any assistive device.  OBJECTIVE:  Note: Objective measures were completed at Evaluation unless otherwise noted.  DIAGNOSTIC FINDINGS: MRI BRAIN WITHOUT CONTRAST  INDICATION: Neuro deficit, acute, stroke suspected, left sided facial weakness, I63.9 Cerebral infarction, unspecified (CMS/HHS-HCC)  COMPARISON: December 02, 2023 CTA head and neck  TECHNIQUE/PROTOCOL: Standard adult protocol brain MR without contrast.  FINDINGS: Brain Parenchyma: Multifocal areas of restricted diffusion within the right cerebral hemisphere; for example, within the right periventricular white matter (series 6 image 47), right posterior temporal lobe (series 6 image 44), inferior right temporal lobe (series 6 image 41), and right parietal lobe (series 6 image 50). No midline shift. Susceptibility artifact from left frontal approach ventriculostomy shunt catheter obscures a majority of the left hemisphere and susceptibility artifact from right ICA terminus coils portions of the right temporal lobe. Encephalomalacia around the coil pack and left frontal parietal lobe, unchanged. Area of FLAIR hyperintensity in the right cerebellar peduncle, likely representing wallerian degeneration. Scattered areas of increased T2/FLAIR signal intensity in the periventricular white matter, non-specific but likely the sequela of chronic small vessel ischemic disease. Ventricles and Sulci: Stable appearance of shunted ventricular system with left frontal approach  ventriculostomy catheter with tip terminating near the septum pellucidum. Extra-Axial Spaces: No extra-axial fluid collection. Basal Cisterns: Normal. Intracranial Flow-Voids: Normal.  Paranasal Sinuses: Non-opacified. Mastoids: Normal. Orbits: Normal. Cranium: Multiple burr holes. Visualized upper cervical spine: No high grade stenosis.   IMPRESSION: 1. Multifocal acute infarcts within the right MCA territory, as above. Distribution may reflect thromboembolic etiology from the more proximal right MCA. 2. Please note that considerable susceptibility artifact from left frontal approach ventriculostomy shunt catheter obscures a majority of the left hemisphere limiting evaluation and precluding assessment for infarct in these locations.  Findings were discussed communicated via Epic secure chat with Devora MD by Lang Payment, MD at 12/03/2023 12:50 AM.  The preliminary report (critical or emergent communication) was reviewed prior to this dictation and there are no substantial differences between the preliminary results and the impressions in this final report.  Electronically Reviewed by:  Metta Fruits, MD, [facility] Electronically Reviewed on:  12/03/2023 11:11 AM  I have reviewed the images and concur with the above findings.  Electronically Signed by:  Evalene Bigger, MD, [facility] Electronically Signed on:  12/03/2023 5:30 PM Procedure Note  Amrhein, Evalene Agent, MD - 12/03/2023 Formatting of this note might be different from the original. MRI BRAIN WITHOUT CONTRAST  INDICATION: Neuro deficit, acute, stroke suspected, left sided facial weakness, I63.9 Cerebral infarction, unspecified (CMS/HHS-HCC)  COMPARISON: December 02, 2023 CTA head and neck  TECHNIQUE/PROTOCOL: Standard adult protocol brain MR without contrast.  FINDINGS: Brain Parenchyma: Multifocal areas of restricted diffusion within the right cerebral hemisphere; for example, within the right  periventricular white matter (series 6 image 47), right posterior temporal lobe (series 6 image 44),  inferior right temporal lobe (series 6 image 41), and right parietal lobe (series 6 image 50). No midline shift. Susceptibility artifact from left frontal approach ventriculostomy shunt catheter obscures a majority of the left hemisphere and susceptibility artifact from right ICA terminus coils portions of the right temporal lobe. Encephalomalacia around the coil pack and left frontal parietal lobe, unchanged. Area of FLAIR hyperintensity in the right cerebellar peduncle, likely representing wallerian degeneration. Scattered areas of increased T2/FLAIR signal intensity in the periventricular white matter, non-specific but likely the sequela of chronic small vessel ischemic disease. Ventricles and Sulci: Stable appearance of shunted ventricular system with left frontal approach ventriculostomy catheter with tip terminating near the septum pellucidum. Extra-Axial Spaces: No extra-axial fluid collection. Basal Cisterns: Normal. Intracranial Flow-Voids: Normal.  Paranasal Sinuses: Non-opacified. Mastoids: Normal. Orbits: Normal. Cranium: Multiple burr holes. Visualized upper cervical spine: No high grade stenosis.   IMPRESSION: 1. Multifocal acute infarcts within the right MCA territory, as above. Distribution may reflect thromboembolic etiology from the more proximal right MCA. 2. Please note that considerable susceptibility artifact from left frontal approach ventriculostomy shunt catheter obscures a majority of the left hemisphere limiting evaluation and precluding assessment for infarct in these locations.  Findings were discussed communicated via Epic secure chat with Devora MD by Lang Payment, MD at 12/03/2023 12:50 AM.  The preliminary report (critical or emergent communication) was reviewed prior to this dictation and there are no substantial differences between the preliminary  results and the impressions in this final report.  Electronically Reviewed by:  Metta Fruits, MD, [facility] Electronically Reviewed on:  12/03/2023 11:11 AM  I have reviewed the images and concur with the above findings.  Electronically Signed by:  Evalene Bigger, MD, [facility] Electronically Signed on:  12/03/2023 5:30 PM Exam End: 12/03/23 00:10   Specimen Collected: 12/02/23 Last Resulted: 12/03/23 17:30  Received From: [facility]  Result Received: 12/09/23 16:07     COGNITION: Overall cognitive status: Within functional limits for tasks assessed   SENSATION: WFL  COORDINATION: Impaired - delayed each LE  EDEMA:  None observed   POSTURE: rounded shoulders and forward head  LOWER EXTREMITY ROM:     Active  Right Eval Left Eval  Hip flexion    Hip extension    Hip abduction    Hip adduction    Hip internal rotation    Hip external rotation    Knee flexion    Knee extension    Ankle dorsiflexion    Ankle plantarflexion    Ankle inversion    Ankle eversion     (Blank rows = not tested)  LOWER EXTREMITY MMT:    MMT Right Eval Left Eval  Hip flexion 4 4  Hip extension    Hip abduction    Hip adduction    Hip internal rotation 2- 2-  Hip external rotation 3+ 3+  Knee flexion 3+ 3+  Knee extension 4 4  Ankle dorsiflexion 4 4  Ankle plantarflexion    Ankle inversion    Ankle eversion    (Blank rows = not tested)  BED MOBILITY:  Not tested  TRANSFERS: Sit to stand: CGA  Assistive device utilized: Environmental consultant - 2 wheeled     Stand to sit: CGA  Assistive device utilized: Environmental consultant - 2 wheeled      RAMP:  Not tested  CURB:  Not tested  STAIRS: Not tested GAIT: Findings: Gait Characteristics: step to pattern, decreased arm swing- Right, decreased arm swing- Left, decreased  step length- Right, decreased step length- Left, decreased stance time- Right, decreased stance time- Left, decreased stride length, decreased hip/knee  flexion- Right, decreased hip/knee flexion- Left, decreased ankle dorsiflexion- Right, decreased ankle dorsiflexion- Left, wide BOS, poor foot clearance- Right, and poor foot clearance- Left, Distance walked: 60 feet, Assistive device utilized:Walker - 2 wheeled, Level of assistance: CGA, and Comments:    FUNCTIONAL TESTS:  5 times sit to stand: 25.20 sec with LUE Support Timed up and go (TUG): 40 sec with RW (increased time to place right hand on walker)  6 minute walk test: 482' 10 meter walk test: 0.65 m/s Berg Balance Scale: 42/56    PATIENT SURVEYS:  Stroke Impact Scale - 65.63%                                                                                                                             TREATMENT DATE: 01/14/24  Patient arrives to session in transport chair.  Unless otherwise stated, CGA/light min A was provided and gait belt donned in order to ensure pt safety throughout session.  L stand pivot transport chair>green chair with CGA using UE support on armrests  10 Meter Walk Test: Patient instructed to walk 10 meters (32.8 ft) as quickly and as safely as possible at their normal speed x2 and at a fast speed x2. Time measured from 2 meter mark to 8 meter mark to accommodate ramp-up and ramp-down.  Normal speed 1: 15.14sec  Normal speed 2: 14.67 sec  Average Normal speed: 0.67 m/s with SPC  Cut off scores: <0.4 m/s = household Ambulator, 0.4-0.8 m/s = limited community Ambulator, >0.8 m/s = community Ambulator, >1.2 m/s = crossing a street, <1.0 = increased fall risk MCID 0.05 m/s (small), 0.13 m/s (moderate), 0.06 m/s (significant)  (ANPTA Core Set of Outcome Measures for Adults with Neurologic Conditions, 2018)  Pt performed 5 time sit<>stand (5xSTS):  23.36 without UE support and CGA-min assist. 20.10 sec with LUE support sec (>15 sec indicates increased fall risk)   PT instructed pt in TUG: 18.33sec with SPC sec (average of 3 trials; >13.5 sec indicates increased  fall risk)   [facility] PT Assessment - 01/14/24 0001       Berg Balance Test   Sit to Stand Able to stand  independently using hands    Standing Unsupported Able to stand safely 2 minutes    Sitting with Back Unsupported but Feet Supported on Floor or Stool Able to sit safely and securely 2 minutes    Stand to Sit Sits safely with minimal use of hands    Transfers Able to transfer safely, definite need of hands    Standing Unsupported with Eyes Closed Able to stand 10 seconds safely    Standing Unsupported with Feet Together Able to place feet together independently and stand for 1 minute with supervision    From Standing, Reach Forward with Outstretched Arm Can reach forward >12 cm safely (5)  From Standing Position, Pick up Object from Floor Able to pick up shoe safely and easily    From Standing Position, Turn to Look Behind Over each Shoulder Looks behind one side only/other side shows less weight shift    Turn 360 Degrees Able to turn 360 degrees safely but slowly    Standing Unsupported, Alternately Place Feet on Step/Stool Able to stand independently and complete 8 steps >20 seconds    Standing Unsupported, One Foot in Front Able to plae foot ahead of the other independently and hold 30 seconds    Standing on One Leg Tries to lift leg/unable to hold 3 seconds but remains standing independently    Total Score 44          Patient demonstrates increased fall risk as noted by score of   44/56 on Berg Balance Scale.  (<36= high risk for falls, close to 100%; 37-45 significant >80%; 46-51 moderate >50%; 52-55 lower >25%)  6 Min Walk Test:  Instructed patient to ambulate as quickly and as safely as possible for 6 minutes using LRAD. Patient was allowed to take standing rest breaks without stopping the test, but if the patient required a sitting rest break the clock would be stopped and the test would be over.  Results: 465 feet  using a SPC with CGA required seated rest break at 4:59min .  Results indicate that the patient has reduced endurance with ambulation compared to age matched norms.  Age Matched Norms: 36-69 yo M: 83 F: 15, 15-79 yo M: 12 F: 471, 23-89 yo M: 417 F: 392 MDC: 58.21 meters (190.98 feet) or 50 meters (ANPTA Core Set of Outcome Measures for Adults with Neurologic Conditions, 2018)   Transported to next therapy session.    PATIENT EDUCATION: Education details: PT plan of care; Discussion of symptoms of CVA and to go to ED as soon as possible. Purpose of PT for balance, strength, coordination and endurance.  Person educated: Patient Education method: Explanation Education comprehension: verbalized understanding  HOME EXERCISE PROGRAM: Access Code: [email or web address] URL: [email or web address] Date: 12/17/2023 Prepared by: Sidra Simpers  Program Notes **Be sure to perform all exercises next to a countertop or sturdy piece of furniture in case you become unsteady.  Exercises - Standing Heel Raise with Support  - 1 x daily - 7 x weekly - 2 sets - 15 reps - Standing Toe Raises at Chair  - 1 x daily - 7 x weekly - 2 sets - 15 reps - Mini Squat with Counter Support  - 1 x daily - 7 x weekly - 2 sets - 15 reps - Lunge with Counter Support  - 1 x daily - 7 x weekly - 2 sets - 15 reps - Standing Tandem Balance with Counter Support  - 1 x daily - 7 x weekly - 2 sets - 2 reps - 30 hold - Standing Single Leg Stance with Counter Support  - 1 x daily - 7 x weekly - 2 sets - 2 reps - 30 hold  GOALS: Goals reviewed with patient? Yes  SHORT TERM GOALS: Target date: 01/21/2024  Pt will be independent with HEP in order to improve strength and balance in order to decrease fall risk and improve function at home.  Baseline: EVAL - No current formal HEP in place Goal status: INITIAL   LONG TERM GOALS: Target date: 03/03/2024  1.  Patient will complete five times sit to stand test in < 15 seconds indicating an increased LE strength  and improved  balance. Baseline: EVAL= 25.20 sec with LUE Support 9/2: 23.36 without UE support and CGA-min assist. 20.10 sec with LUE support Goal status: INITIAL  2.  Patient will increase Berg Balance score by > 6 points to demonstrate decreased fall risk during functional activities. Baseline: EVAL: To be assessed next visit; 12/12/2023= 42/56 9/2: 44/56  Goal status: INITIAL   3.  Patient will reduce timed up and go to <11 seconds to reduce fall risk and demonstrate improved transfer/gait ability. Baseline: EVAL = 40 sec with RW 9/2: 18.33sec with SPC Goal status: INITIAL  4.  Patient will increase 10 meter walk test to >1.18m/s as to improve gait speed for better community ambulation and to reduce fall risk. Baseline: EVAL: 0.65 m/s 9/2: 0.67 m/s with SPC Goal status: INITIAL  5. Patient will increase six minute walk test distance by 200 feet for progression to community ambulator and improve gait ability Baseline: EVAL= 482' 9/2: 465 feet  using a SPC with CGA required seated rest break at 4:49min  Goal status: INITIAL   ASSESSMENT:  CLINICAL IMPRESSION: PT arrived in better spirits to this PT treatment. PT treatment focused goal assessment for progress note. Pt demonstrates improved gait speed, reduced time on TUG and 5x STS, improved Balance scale, and reports consistency with HEP. Slight reduction with distance in 6 min walk test, but performed with SPC on this day, and was noted to have increased anxiety after 3 min requiring seated rest break. Patient will benefit from continued encouragement and support of interventions that promote increased confidence with functional mobility tasks. Pt will continue to benefit from skilled therapy to address remaining deficits in order to improve overall QoL and return to PLOF.    Patient's condition has the potential to improve in response to therapy. Maximum improvement is yet to be obtained. The anticipated improvement is attainable and reasonable in a  generally predictable time.       OBJECTIVE IMPAIRMENTS: Abnormal gait, decreased activity tolerance, decreased balance, decreased coordination, decreased endurance, decreased mobility, difficulty walking, decreased ROM, decreased strength, impaired UE functional use, and pain.   ACTIVITY LIMITATIONS: carrying, lifting, bending, sitting, standing, squatting, sleeping, stairs, and transfers  PARTICIPATION LIMITATIONS: meal prep, cleaning, laundry, driving, shopping, community activity, and yard work  PERSONAL FACTORS: 1-2 comorbidities: OA, previous CVA with significant R sided weakness are also affecting patient's functional outcome.   REHAB POTENTIAL: Good  CLINICAL DECISION MAKING: Stable/uncomplicated  EVALUATION COMPLEXITY: Moderate  PLAN:  PT FREQUENCY: 1-2x/week  PT DURATION: 12 weeks  PLANNED INTERVENTIONS: 97164- PT Re-evaluation, 97750- Physical Performance Testing, 97110-Therapeutic exercises, 97530- Therapeutic activity, [id number]- Neuromuscular re-education, 97535- Self Care, [id number]- Manual therapy, [id number]- Gait training, [phone]- Orthotic Initial, [phone]- Orthotic/Prosthetic subsequent, [phone]- Canalith repositioning, [phone]- Electrical stimulation (manual), [phone] (1-2 muscles), [phone] (3+ muscles)- Dry Needling, Patient/Family education, Balance training, Stair training, Taping, Joint mobilization, Joint manipulation, Spinal manipulation, Spinal mobilization, Compression bandaging, Vestibular training, DME instructions, Cryotherapy, and Moist heat  PLAN FOR NEXT SESSION:   Continue  Curb step navigation training  B LE functional strengthening Sit<>stands Step-ups  Gait training with LRAD Assess HEP compliance and progress as appropriate.  Massie Dollar PT, DPT  Physical Therapist - [facility]  10:41 AM 01/14/24

## 2024-01-16 ENCOUNTER — Ambulatory Visit: Admitting: Physical Therapy

## 2024-01-16 ENCOUNTER — Ambulatory Visit

## 2024-01-16 DIAGNOSIS — M6281 Muscle weakness (generalized): Secondary | ICD-10-CM | POA: Diagnosis not present

## 2024-01-16 DIAGNOSIS — R278 Other lack of coordination: Secondary | ICD-10-CM

## 2024-01-16 DIAGNOSIS — R269 Unspecified abnormalities of gait and mobility: Secondary | ICD-10-CM

## 2024-01-16 DIAGNOSIS — R296 Repeated falls: Secondary | ICD-10-CM

## 2024-01-16 DIAGNOSIS — R2689 Other abnormalities of gait and mobility: Secondary | ICD-10-CM

## 2024-01-16 DIAGNOSIS — R262 Difficulty in walking, not elsewhere classified: Secondary | ICD-10-CM

## 2024-01-16 NOTE — Therapy (Signed)
 OUTPATIENT PHYSICAL THERAPY NEURO TREATMENT/    Patient Name: Erika Stanton PAYMENT MRN: [id number] DOB:1956/04/29, 68 y.o., female Today's Date: 01/16/2024   PCP: Dr. Oneil Galloway  REFERRING PROVIDER: Dr. Oneil Galloway  END OF SESSION:  PT End of Session - 01/16/24 1531     Visit Number 11    Number of Visits 24    Date for PT Re-Evaluation 03/03/24    Progress Note Due on Visit 20    PT Start Time 1530    PT Stop Time 1610    PT Time Calculation (min) 40 min    Equipment Utilized During Treatment Gait belt    Activity Tolerance Patient tolerated treatment well    Behavior During Therapy WFL for tasks assessed/performed           Past Medical History:  Diagnosis Date   Anatomical narrow angle, bilateral    Aneurysm (HCC)    Asthma    Cerebral aneurysm    Cerebral vasospasm    Cognitive change    Dysphagia, post-stroke    GERD (gastroesophageal reflux disease)    Hemorrhoids    History of cervical dysplasia    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 no recurrent polyps.  suggest repeat surveillance study 11/2022.     COLONOSCOPY W/ POLYPECTOMY  06/2014   Dr Elder at [facility].  3 adenomatoous polyps, anal fissure.     ESOPHAGOGASTRODUODENOSCOPY (EGD) WITH PROPOFOL  N/A 01/27/2020   Procedure: ESOPHAGOGASTRODUODENOSCOPY (EGD) WITH PROPOFOL ;  Surgeon: Shila Gustav GAILS, MD;  Location: [facility] ENDOSCOPY;  Service: Endoscopy;  Laterality: N/A;   INSERTION OF PERMANENT INTRAPERITONEAL CANNULA/CATH N/A    LAPAROSCOPIC   IR 3D INDEPENDENT WKST  01/06/2020    IR ANGIO INTRA EXTRACRAN SEL INTERNAL CAROTID BILAT MOD SED  01/06/2020   IR ANGIO VERTEBRAL SEL VERTEBRAL UNI L MOD SED  01/06/2020   IR ANGIOGRAM FOLLOW UP STUDY  01/06/2020   IR ANGIOGRAM FOLLOW UP STUDY  01/06/2020   IR ANGIOGRAM FOLLOW UP STUDY  01/06/2020   IR ANGIOGRAM FOLLOW UP STUDY  01/06/2020   IR ANGIOGRAM FOLLOW UP STUDY  01/06/2020   IR ANGIOGRAM FOLLOW UP STUDY  01/06/2020   IR ANGIOGRAM FOLLOW UP STUDY  01/06/2020   IR ANGIOGRAM FOLLOW UP STUDY  01/06/2020   IR ANGIOGRAM FOLLOW UP STUDY  01/06/2020   IR ANGIOGRAM FOLLOW UP STUDY  01/06/2020   IR NEURO EACH ADD'L AFTER BASIC UNI RIGHT (MS)  01/06/2020   IR TRANSCATH/EMBOLIZ  01/06/2020   MASTECTOMY     MOHS SURGERY N/A    RADIOLOGY WITH ANESTHESIA N/A 01/06/2020   Procedure: IR WITH ANESTHESIA FOR ANEURYSM;  Surgeon: Lanis Pupa, MD;  Location: [facility] OR;  Service: Radiology;  Laterality: N/A;   STENT SUPPORTED EMBOLIZATION OF RIGHT ICA ANEURYSM Right    TUBAL LIGATION     VENTRICULO-PERITONEAL SHUNT PLACEMENT / LAPAROSCOPIC INSERTION PERITONEAL CATHETER N/A    Patient Active Problem List   Diagnosis Date Noted  Dyspareunia in female 04/05/2020   History of abnormal cervical Pap smear 03/10/2020   GIB (gastrointestinal bleeding) 02/28/2020   Elevated BUN    Prediabetes    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   Cerebral vasospasm    Sinus tachycardia    Dysphagia, post-stroke    Thrombocytopenia (HCC)    Acute blood loss anemia    Brain aneurysm    ICH (intracerebral hemorrhage) (HCC) 01/05/2020   History of adenomatous polyp of colon 02/07/2016   Anatomical narrow angle, bilateral 12/14/2014   Fissure in ano 11/11/2014   Hemorrhoids 11/11/2014   Constipation 11/19/2013   GERD (gastroesophageal reflux disease) 03/24/2013   Irritable bowel syndrome with diarrhea 03/24/2013   Herpes zoster 05/14/2005   Abnormal Pap smear of cervix 05/15/1983   History of cervical dysplasia 05/15/1983    ONSET  DATE: 12/02/2023  REFERRING DIAG: I63.9 (ICD-10-CM) - CVA (cerebral vascular accident) (HCC)   THERAPY DIAG:  Muscle weakness (generalized)  Other lack of coordination  Abnormality of gait and mobility  Difficulty in walking, not elsewhere classified  Repeated falls  Other abnormalities of gait and mobility  Rationale for Evaluation and Treatment: Rehabilitation  SUBJECTIVE:                                                                                                                                                                                             SUBJECTIVE STATEMENT:  Pt reports that she is doing well. Has a little soreness in the R knee. Does not rate.  Is continuing to perform HEP daily at home. Reports no issues with current HEP  From EVAL:Patient reports she was in hospital from [facility]- Sat (July 21st- 25th) with CVA affecting left side. I was supposed to have a knee replacement surgery in September but that is on hold for now. I was doing my prep for my colonoscopy and fell in the bathroom.  Patient reports having mild left sided weakness compared to her CVA in 2021 with heavy Right sided UE weakness. She reports bad OA affecting both knees as well.    Pt accompanied by: Husband brought patient  PERTINENT HISTORY: Previous CVA in 2021 affecting Right side. Reports going to ED  last Monday due to fall/CVA affecting Left side. Diagnosed with R CVA with Left Sided weakness.    PAIN:  Are you having pain? Yes: NPRS scale: Current = 1/10; worst= 6/10 Pain location: Bilateral knees- Arthritis  Pain description: achy yet sharp with weight bearing Aggravating factors: Increased walking, transfers Relieving factors: rest  PRECAUTIONS: Fall  RED FLAGS: None   WEIGHT BEARING RESTRICTIONS: No  FALLS: Has  patient fallen in last 6 months? Yes. Number of falls fell with the stroke  LIVING ENVIRONMENT: Lives with: lives with their spouse Lives in:  House/apartment Stairs: Yes: External: 2 steps; on left going up Has following equipment at home: Single point cane, Walker - 2 wheeled, shower chair, and Grab bars  PLOF: Independent- Retired from [employer]  PATIENT GOALS: Walk without any assistive device.  OBJECTIVE:  Note: Objective measures were completed at Evaluation unless otherwise noted.  DIAGNOSTIC FINDINGS: MRI BRAIN WITHOUT CONTRAST  INDICATION: Neuro deficit, acute, stroke suspected, left sided facial weakness, I63.9 Cerebral infarction, unspecified (CMS/HHS-HCC)  COMPARISON: December 02, 2023 CTA head and neck  TECHNIQUE/PROTOCOL: Standard adult protocol brain MR without contrast.  FINDINGS: Brain Parenchyma: Multifocal areas of restricted diffusion within the right cerebral hemisphere; for example, within the right periventricular white matter (series 6 image 47), right posterior temporal lobe (series 6 image 44), inferior right temporal lobe (series 6 image 41), and right parietal lobe (series 6 image 50). No midline shift. Susceptibility artifact from left frontal approach ventriculostomy shunt catheter obscures a majority of the left hemisphere and susceptibility artifact from right ICA terminus coils portions of the right temporal lobe. Encephalomalacia around the coil pack and left frontal parietal lobe, unchanged. Area of FLAIR hyperintensity in the right cerebellar peduncle, likely representing wallerian degeneration. Scattered areas of increased T2/FLAIR signal intensity in the periventricular white matter, non-specific but likely the sequela of chronic small vessel ischemic disease. Ventricles and Sulci: Stable appearance of shunted ventricular system with left frontal approach ventriculostomy catheter with tip terminating near the septum pellucidum. Extra-Axial Spaces: No extra-axial fluid collection. Basal Cisterns: Normal. Intracranial Flow-Voids: Normal.  Paranasal Sinuses: Non-opacified. Mastoids:  Normal. Orbits: Normal. Cranium: Multiple burr holes. Visualized upper cervical spine: No high grade stenosis.   IMPRESSION: 1. Multifocal acute infarcts within the right MCA territory, as above. Distribution may reflect thromboembolic etiology from the more proximal right MCA. 2. Please note that considerable susceptibility artifact from left frontal approach ventriculostomy shunt catheter obscures a majority of the left hemisphere limiting evaluation and precluding assessment for infarct in these locations.  Findings were discussed communicated via Epic secure chat with Devora MD by Lang Payment, MD at 12/03/2023 12:50 AM.  The preliminary report (critical or emergent communication) was reviewed prior to this dictation and there are no substantial differences between the preliminary results and the impressions in this final report.  Electronically Reviewed by:  Metta Fruits, MD, [facility] Electronically Reviewed on:  12/03/2023 11:11 AM  I have reviewed the images and concur with the above findings.  Electronically Signed by:  Evalene Bigger, MD, [facility] Electronically Signed on:  12/03/2023 5:30 PM Procedure Note  Amrhein, Evalene Agent, MD - 12/03/2023 Formatting of this note might be different from the original. MRI BRAIN WITHOUT CONTRAST  INDICATION: Neuro deficit, acute, stroke suspected, left sided facial weakness, I63.9 Cerebral infarction, unspecified (CMS/HHS-HCC)  COMPARISON: December 02, 2023 CTA head and neck  TECHNIQUE/PROTOCOL: Standard adult protocol brain MR without contrast.  FINDINGS: Brain Parenchyma: Multifocal areas of restricted diffusion within the right cerebral hemisphere; for example, within the right periventricular white matter (series 6 image 47), right posterior temporal lobe (series 6 image 44), inferior right temporal lobe (series 6 image 41), and right parietal lobe (series 6 image 50). No midline shift. Susceptibility artifact  from left frontal approach ventriculostomy shunt catheter obscures a majority of the left hemisphere and susceptibility artifact from right ICA terminus coils portions of the right temporal lobe.  Encephalomalacia around the coil pack and left frontal parietal lobe, unchanged. Area of FLAIR hyperintensity in the right cerebellar peduncle, likely representing wallerian degeneration. Scattered areas of increased T2/FLAIR signal intensity in the periventricular white matter, non-specific but likely the sequela of chronic small vessel ischemic disease. Ventricles and Sulci: Stable appearance of shunted ventricular system with left frontal approach ventriculostomy catheter with tip terminating near the septum pellucidum. Extra-Axial Spaces: No extra-axial fluid collection. Basal Cisterns: Normal. Intracranial Flow-Voids: Normal.  Paranasal Sinuses: Non-opacified. Mastoids: Normal. Orbits: Normal. Cranium: Multiple burr holes. Visualized upper cervical spine: No high grade stenosis.   IMPRESSION: 1. Multifocal acute infarcts within the right MCA territory, as above. Distribution may reflect thromboembolic etiology from the more proximal right MCA. 2. Please note that considerable susceptibility artifact from left frontal approach ventriculostomy shunt catheter obscures a majority of the left hemisphere limiting evaluation and precluding assessment for infarct in these locations.  Findings were discussed communicated via Epic secure chat with Devora MD by Lang Payment, MD at 12/03/2023 12:50 AM.  The preliminary report (critical or emergent communication) was reviewed prior to this dictation and there are no substantial differences between the preliminary results and the impressions in this final report.  Electronically Reviewed by:  Metta Fruits, MD, [facility] Electronically Reviewed on:  12/03/2023 11:11 AM  I have reviewed the images and concur with the above  findings.  Electronically Signed by:  Evalene Bigger, MD, [facility] Electronically Signed on:  12/03/2023 5:30 PM Exam End: 12/03/23 00:10   Specimen Collected: 12/02/23 Last Resulted: 12/03/23 17:30  Received From: [facility]  Result Received: 12/09/23 16:07     COGNITION: Overall cognitive status: Within functional limits for tasks assessed   SENSATION: WFL  COORDINATION: Impaired - delayed each LE  EDEMA:  None observed   POSTURE: rounded shoulders and forward head  LOWER EXTREMITY ROM:     Active  Right Eval Left Eval  Hip flexion    Hip extension    Hip abduction    Hip adduction    Hip internal rotation    Hip external rotation    Knee flexion    Knee extension    Ankle dorsiflexion    Ankle plantarflexion    Ankle inversion    Ankle eversion     (Blank rows = not tested)  LOWER EXTREMITY MMT:    MMT Right Eval Left Eval  Hip flexion 4 4  Hip extension    Hip abduction    Hip adduction    Hip internal rotation 2- 2-  Hip external rotation 3+ 3+  Knee flexion 3+ 3+  Knee extension 4 4  Ankle dorsiflexion 4 4  Ankle plantarflexion    Ankle inversion    Ankle eversion    (Blank rows = not tested)  BED MOBILITY:  Not tested  TRANSFERS: Sit to stand: CGA  Assistive device utilized: Environmental consultant - 2 wheeled     Stand to sit: CGA  Assistive device utilized: Environmental consultant - 2 wheeled      RAMP:  Not tested  CURB:  Not tested  STAIRS: Not tested GAIT: Findings: Gait Characteristics: step to pattern, decreased arm swing- Right, decreased arm swing- Left, decreased step length- Right, decreased step length- Left, decreased stance time- Right, decreased stance time- Left, decreased stride length, decreased hip/knee flexion- Right, decreased hip/knee flexion- Left, decreased ankle dorsiflexion- Right, decreased ankle dorsiflexion- Left, wide BOS, poor foot clearance- Right, and poor foot clearance- Left, Distance walked: 60 feet,  Assistive device utilized:Walker - 2 wheeled, Level of assistance: CGA, and Comments:    FUNCTIONAL TESTS:  5 times sit to stand: 25.20 sec with LUE Support Timed up and go (TUG): 40 sec with RW (increased time to place right hand on walker)  6 minute walk test: 482' 10 meter walk test: 0.65 m/s Berg Balance Scale: 42/56    PATIENT SURVEYS:  Stroke Impact Scale - 65.63%                                                                                                                             TREATMENT DATE: 01/16/24  Patient arrives to session in transport chair.  Gait with SPC 146ft x 2  Gait without AD 160ft x 2 + 330ft  Forward/reverse without AD 52ft x 4 each  Side stepping R and L without 12 ft x 3 bil  Y stepping x 5 CW/CCW  Sit<>stand x 8 with mild knee pain reported.   Throughout gait training PT instructed pt in relax the RUE to reduce bicep tone/ in flexion as well as neutral hip ER/IR to reduce inversion with heel contact on the RLE.  CGA for safety and gail belt in place throughout session unless otherwise noted. Pt demonstrated mild SOB with prolonged gait training, but able to complete without increased anxiety on this day.       PATIENT EDUCATION: Education details: PT plan of care; Discussion of symptoms of CVA and to go to ED as soon as possible. Purpose of PT for balance, strength, coordination and endurance.  Person educated: Patient Education method: Explanation Education comprehension: verbalized understanding  HOME EXERCISE PROGRAM: Access Code: [email or web address] URL: [email or web address] Date: 12/17/2023 Prepared by: Sidra Simpers  Program Notes **Be sure to perform all exercises next to a countertop or sturdy piece of furniture in case you become unsteady.  Exercises - Standing Heel Raise with Support  - 1 x daily - 7 x weekly - 2 sets - 15 reps - Standing Toe Raises at Chair  - 1 x daily - 7 x weekly - 2 sets - 15 reps - Mini Squat with  Counter Support  - 1 x daily - 7 x weekly - 2 sets - 15 reps - Lunge with Counter Support  - 1 x daily - 7 x weekly - 2 sets - 15 reps - Standing Tandem Balance with Counter Support  - 1 x daily - 7 x weekly - 2 sets - 2 reps - 30 hold - Standing Single Leg Stance with Counter Support  - 1 x daily - 7 x weekly - 2 sets - 2 reps - 30 hold  GOALS: Goals reviewed with patient? Yes  SHORT TERM GOALS: Target date: 01/21/2024  Pt will be independent with HEP in order to improve strength and balance in order to decrease fall risk and improve function at home.  Baseline: EVAL - No current formal HEP in place Goal status: INITIAL  LONG TERM GOALS: Target date: 03/03/2024  1.  Patient will complete five times sit to stand test in < 15 seconds indicating an increased LE strength and improved balance. Baseline: EVAL= 25.20 sec with LUE Support 9/2: 23.36 without UE support and CGA-min assist. 20.10 sec with LUE support Goal status: INITIAL  2.  Patient will increase Berg Balance score by > 6 points to demonstrate decreased fall risk during functional activities. Baseline: EVAL: To be assessed next visit; 12/12/2023= 42/56 9/2: 44/56  Goal status: INITIAL   3.  Patient will reduce timed up and go to <11 seconds to reduce fall risk and demonstrate improved transfer/gait ability. Baseline: EVAL = 40 sec with RW 9/2: 18.33sec with SPC Goal status: INITIAL  4.  Patient will increase 10 meter walk test to >1.65m/s as to improve gait speed for better community ambulation and to reduce fall risk. Baseline: EVAL: 0.65 m/s 9/2: 0.67 m/s with SPC Goal status: INITIAL  5. Patient will increase six minute walk test distance by 200 feet for progression to community ambulator and improve gait ability Baseline: EVAL= 482' 9/2: 465 feet  using a SPC with CGA required seated rest break at 4:46min  Goal status: INITIAL   ASSESSMENT:  CLINICAL IMPRESSION: PT arrives to PT motivated to participate. PT  treatment focused on dynamic gait training with cues for improved gait pattern to reduce hip IR and ankle inversion with heel contact as well as improved posture and reduced tone in the RUE. Pt was able to complete multiple bouts of prolonged gait training with mild SOB, but no increased anxiety on this day.  Pt will continue to benefit from skilled therapy to address remaining deficits in order to improve overall QoL and return to PLOF.     OBJECTIVE IMPAIRMENTS: Abnormal gait, decreased activity tolerance, decreased balance, decreased coordination, decreased endurance, decreased mobility, difficulty walking, decreased ROM, decreased strength, impaired UE functional use, and pain.   ACTIVITY LIMITATIONS: carrying, lifting, bending, sitting, standing, squatting, sleeping, stairs, and transfers  PARTICIPATION LIMITATIONS: meal prep, cleaning, laundry, driving, shopping, community activity, and yard work  PERSONAL FACTORS: 1-2 comorbidities: OA, previous CVA with significant R sided weakness are also affecting patient's functional outcome.   REHAB POTENTIAL: Good  CLINICAL DECISION MAKING: Stable/uncomplicated  EVALUATION COMPLEXITY: Moderate  PLAN:  PT FREQUENCY: 1-2x/week  PT DURATION: 12 weeks  PLANNED INTERVENTIONS: 97164- PT Re-evaluation, 97750- Physical Performance Testing, 97110-Therapeutic exercises, 97530- Therapeutic activity, [id number]- Neuromuscular re-education, 97535- Self Care, [id number]- Manual therapy, [id number]- Gait training, [phone]- Orthotic Initial, [phone]- Orthotic/Prosthetic subsequent, [phone]- Canalith repositioning, [phone]- Electrical stimulation (manual), [phone] (1-2 muscles), [id number] (3+ muscles)- Dry Needling, Patient/Family education, Balance training, Stair training, Taping, Joint mobilization, Joint manipulation, Spinal manipulation, Spinal mobilization, Compression bandaging, Vestibular training, DME instructions, Cryotherapy, and Moist heat  PLAN FOR NEXT SESSION:   Continue   Curb step navigation training  B LE functional strengthening Sit<>stands Step-ups  Gait training with LRAD with increased activity tolerance.  Assess HEP compliance and progress as appropriate.  Massie Dollar PT, DPT  Physical Therapist - [place]  [facility]  3:33 PM 01/16/24

## 2024-01-16 NOTE — Therapy (Unsigned)
 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 no recurrent polyps.  suggest repeat surveillance study 11/2022.     COLONOSCOPY W/ POLYPECTOMY  06/2014   Dr Elder at [facility].  3 adenomatoous polyps, anal fissure.     ESOPHAGOGASTRODUODENOSCOPY (EGD) WITH PROPOFOL  N/A 01/27/2020   Procedure: ESOPHAGOGASTRODUODENOSCOPY (EGD) WITH PROPOFOL ;  Surgeon: Shila Gustav GAILS, MD;  Location: [facility] ENDOSCOPY;  Service: Endoscopy;  Laterality: N/A;   INSERTION OF PERMANENT INTRAPERITONEAL CANNULA/CATH N/A    LAPAROSCOPIC   IR 3D INDEPENDENT WKST  01/06/2020   IR ANGIO INTRA EXTRACRAN SEL INTERNAL CAROTID BILAT MOD SED  01/06/2020   IR ANGIO VERTEBRAL SEL VERTEBRAL UNI L MOD SED  01/06/2020   IR ANGIOGRAM FOLLOW UP STUDY  01/06/2020   IR ANGIOGRAM FOLLOW UP STUDY  01/06/2020   IR ANGIOGRAM FOLLOW UP STUDY  01/06/2020   IR ANGIOGRAM FOLLOW UP STUDY  01/06/2020   IR ANGIOGRAM FOLLOW UP STUDY  01/06/2020   IR ANGIOGRAM FOLLOW UP STUDY  01/06/2020   IR ANGIOGRAM FOLLOW UP  STUDY  01/06/2020   IR ANGIOGRAM FOLLOW UP STUDY  01/06/2020   IR ANGIOGRAM FOLLOW UP STUDY  01/06/2020   IR ANGIOGRAM FOLLOW UP STUDY  01/06/2020   IR NEURO EACH ADD'L AFTER BASIC UNI RIGHT (MS)  01/06/2020   IR TRANSCATH/EMBOLIZ  01/06/2020   MASTECTOMY     MOHS SURGERY N/A    RADIOLOGY WITH ANESTHESIA N/A 01/06/2020   Procedure: IR WITH ANESTHESIA FOR ANEURYSM;  Surgeon: Lanis Pupa, MD;  Location: [facility] OR;  Service: Radiology;  Laterality: N/A;   STENT SUPPORTED EMBOLIZATION OF RIGHT ICA ANEURYSM Right    TUBAL LIGATION     VENTRICULO-PERITONEAL SHUNT PLACEMENT / LAPAROSCOPIC INSERTION PERITONEAL CATHETER N/A    Patient Active Problem List   Diagnosis Date Noted   Dyspareunia in female 04/05/2020   History of abnormal cervical Pap smear 03/10/2020   GIB (gastrointestinal bleeding) 02/28/2020   Elevated BUN    Prediabetes    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   Cerebral vasospasm    Sinus tachycardia    Dysphagia, post-stroke    Thrombocytopenia (HCC)    Acute blood loss anemia    Brain aneurysm    ICH (intracerebral hemorrhage) (HCC) 01/05/2020   History of adenomatous polyp of colon 02/07/2016   Anatomical narrow angle, bilateral 12/14/2014   Fissure in ano 11/11/2014   Hemorrhoids 11/11/2014   Constipation  11/19/2013   GERD (gastroesophageal reflux disease) 03/24/2013   Irritable bowel syndrome with diarrhea 03/24/2013   Herpes zoster 05/14/2005   Abnormal Pap smear of cervix 05/15/1983   History of cervical dysplasia 05/15/1983   ONSET DATE: 12/02/2023  REFERRING DIAG: L ischemic CVA  THERAPY DIAG:  No diagnosis found.  Rationale for Evaluation and Treatment: Rehabilitation  SUBJECTIVE:  SUBJECTIVE STATEMENT: Pt. reports still being sad about not being able to have her knee replacement for 9 months. Pt accompanied by: self  PERTINENT HISTORY:  Pt. Was admitted to [facility] from 12/02/23-12/06/23 with an Acute Right MCA CVA, Hydrocephalus with shunt   placement. History of Subarachnoid Hemorrhage with bilateral ICA coiled aneurysms. 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  PRECAUTIONS: None  WEIGHT BEARING RESTRICTIONS: No  PAIN:  01/14/24: No reports of pain 01/09/24: 4/10 pain bilat knees  Are you having pain? No  FALLS: Has patient fallen in last 6 months? Yes. Number of falls 1  LIVING ENVIRONMENT: Lives with: lives with their family and lives with their spouse Lives in: House/apartment Stairs: hand rails, 2 steps   Has following equipment at home: [name], cane  PLOF: Independent with basic ADLs, Spouse help as needed  PATIENT GOALS: Strengthening  OBJECTIVE:  Note: Objective measures were completed at Evaluation unless otherwise noted.  HAND DOMINANCE: Left  ADLs: Overall ADLs: spouse assists when needed, Pt. reports that she can do most tasks herself. Uses her left hand to engage in self-care tasks. Eating: Able to cut foods with a fork, husband assists as needed.  Grooming: husband assists with applying toothpaste, able to brush teeth and hair UB Dressing: Spouse assists with applying/fastening bra, Pt. Requires assistance with zippers, and buttons. LB Dressing: Spouse assists with tying shoes when needed, uses slip on shoes. Pt. Does not zip pants. Toileting: Able to complete independently Bathing: Pt. Requires intermittent assistance with bathing tasks. Tub Shower transfers: Spouse assists with transfers when/if needed Equipment: Shower seat with back  IADLs: Shopping: Spouse typically does the shopping, isnt able to carry heavy objects.  Light housekeeping: Spouse typically does most of the cleaning around the home, Pt. Cleans bathroom,  and laundry Pt. Is unable to carry laundry due to exacerbation of weakness.  Meal Prep: Spouse typically cooks and prepares meals.  Community mobility: Pt. Requires assistance using walker. Pt. reports that she is unable to carry heavy items, and typically does not do the shopping.  Medication management: Pt. is able to take care of meds with pillbox Financial management:  Spouse takes care of it, and makes online payments.  Handwriting: 50% legible  MOBILITY STATUS: Needs Assist: Pt. Requires use of a walker   POSTURE COMMENTS:  No Significant postural limitations Sitting balance: Good  ACTIVITY TOLERANCE: Activity tolerance:   FUNCTIONAL OUTCOME MEASURES: TBD  UPPER EXTREMITY ROM:    Active ROM Right eval Left eval  Shoulder flexion 94(121) 102(125)  Shoulder abduction 75(106) 110(126)  Shoulder adduction    Shoulder extension    Shoulder internal rotation    Shoulder external rotation    Elbow flexion 135(155) 140(150)  Elbow extension 0 -10(-9)  Wrist flexion 40(44) 42(58)  Wrist extension 30(34) 40(50)  Wrist ulnar deviation    Wrist radial deviation    Wrist pronation  Wrist supination    (Blank rows = not tested)  UPPER EXTREMITY MMT:     MMT Right eval Left eval  Shoulder flexion 3-/5 3-/5  Shoulder abduction 3-/5 3-/5  Shoulder adduction    Shoulder extension    Shoulder internal rotation    Shoulder external rotation    Middle trapezius    Lower trapezius    Elbow flexion 4-/5 4+/5  Elbow extension 4-/5 4+/5  Wrist flexion 4-/5 3+/5  Wrist extension 4-/5 3+/5  Wrist ulnar deviation    Wrist radial deviation    Wrist pronation    Wrist supination    (Blank rows = not tested)  HAND FUNCTION: Grip strength: Right: N/T lbs; Left: 43 lbs, Lateral pinch: Right: N/T lbs, Left: 9 lbs, and 3 point pinch: Right: N/T lbs, Left: 5 lbs Pt. Presents with flexor tightness in the right 5th digit, however is able to passively extend 5th digit within normal  range. Pt. Also Presents with hyper extension in the 2nd and 3rd digit at PIP joints. Pt. Is able to achieve full Digit MP, PIP, and DIP PROM.  COORDINATION: 9 Hole Peg test: Right: N/T sec; Left: 51 sec  SENSATION: Not tested  EDEMA:   MUSCLE TONE: Fluctuating flexor tone in R hand.  COGNITION: Overall cognitive status: Within functional limits for tasks assessed  VISION: Subjective report:  Baseline vision:  Visual history:   VISION ASSESSMENT:  PERCEPTION:   PRAXIS:   OBSERVATIONS: Pt. presents with fluctuating flexor tone in the R hand, involving the 5th digit at the PIP/DIP joint. Pt. Presents with hyperextension at the 2nd and 3rd digits at the PIP joints.                                                                                                                    TREATMENT DATE: 02/06/2024  Therapeutic Exercises:  -Pt. tolerated right hand PROM/AAROM through all joint ranges.  -Pt. Performed gross grip strengthening with the left hand using 2 resistive band strength of force.  Therapeutic Activities:  -Left hand lateral, and 3pt. pinch strengthening using yellow, red, green, and blue level resistive clips. -Incorporated a reaching component in combination with the task to further challenge distal motor control while moving the proximal UE.  -Facilitated Left hand [facility] skills grasping 1.5 discs, storing them in the hand, then moving them from the palm to th tip of the 2nd digit, and thumb in preparation for reaching up  to discard them through a slotted container. -Facilitated left hand [facility] skills grasping dice, holding one at a time in the palm while simultaneously grasping another one with the 2nd digit, and thumb in preparation for stacking them into a vertical tower on the tabletop.  PATIENT EDUCATION: Education details: splint wearing schedule, HEP review Person educated: Patient, spouse Education method: Explanation, Demonstration, Tactile cues, and Verbal  cues Education comprehension: verbalized understanding and returned demonstration, further training needed  HOME EXERCISE PROGRAM: Pink theraputty; visual handout issued -Self and caregiver assisted PROM  to R hand and wrist  GOALS: Goals reviewed with patient? Yes  SHORT TERM GOALS: Target date: 01/21/2024    Pt. Will be independent with HEP for UE functioning. Baseline: Eval:No current HEP; 01/16/24: pt reports doing a little writing and and squeezing a ball, but denies using putty; spouse stretches RUE daily Goal status: INITIAL  LONG TERM GOALS: Target date: 03/03/2024    Pt. Will improve [facility] skills to be able to be able to manipulate small objects at home Baseline: 9 hole peg test: L: 51 seconds Goal status: INITIAL  2.  Pt. Will increase BUE shoulder ROM by 10 degrees to be able to reach into cabinets and shelves.  Baseline: Shoulder flexion: R: 94(121), L: 102(125), Shoulder abduction: R: 75(106), L: 110(126); 01/16/24: Shoulder flexion R: 100 with scaption(132), abd R 74 (98), L flexion 134 with scaption (145), L shoulder abd 140, (170) Goal status: INITIAL  3.  Pt. Will improve L grip strength by 5# to be able to securely grasp ADL items at home.  Baseline: Grip strength: R: NT, L: 43#; 01/16/24: L: 33# Goal status: INITIAL  4.  Pt. Will increase L lateral key pinch strength by 3#  to open wide mouth jars.  Baseline: Lateral key pinch: R: NT, L: 9#; 01/16/24: L 8 # Goal status: INITIAL  5.  Pt. Will independently engage the right hand as a gross assist to the left hand 100% of the time during ADLs/IADLs. Baseline: Eval: Pt. With limited engagement of the right hand as a gross assist during tasks.  Goal status: INITIAL  6.  Pt. Will  improve left hand [facility] skills by 3 sec. of speed to be able to manipulate zippers, and buttons efficiently. Baseline: Eval:Right: NT Left: 51 sec; 01/16/24: Left: 1 min and 1 sec  Goal status:   7. Pt. Will improve handwriting legibility to 100% in  printed form for written correspondence.  Baseline: 50% legible in printed form for first name only. Goal status: INITIAL  ASSESSMENT:  CLINICAL IMPRESSION:   Pt. was able to perform left hand pinch strengthening with yellow, red, green, and blue level resistance with cues for form, and technique required.  Pt. Required proximal assist to to reach out to place the discs through a slotted target.  Pt. was able create a vertical tower of 5 pieces. Pt. was able to discard the discs from the radial aspect of her hand. However,  Has difficulty discarding items from her thumb, and 2nd digit ultimately resorting to discarding them from the ulnar aspect of the hand. Pt. continues to benefit from a visual perceptual assessment of visual discrimination skills. Pt. continues to benefit from OT services to increase BUE ROM in the shoulder, wrist and hands, as well as increasing L grip/pinch strength and improve [facility] skills to be able to perform her desired ADL/IADLs.   PERFORMANCE DEFICITS: in functional skills including ADLs, IADLs, coordination, dexterity, edema, tone, ROM, strength, pain, Fine motor control, Gross motor control, endurance, and UE functional use, and psychosocial skills including coping strategies, environmental adaptation, habits, and routines and behaviors.   IMPAIRMENTS: are limiting patient from ADLs, IADLs, rest and sleep, leisure, and social participation.   CO-MORBIDITIES: may have co-morbidities  that affects occupational performance. Patient will benefit from skilled OT to address above impairments and improve overall function.  MODIFICATION OR ASSISTANCE TO COMPLETE EVALUATION: Min-Moderate modification of tasks or assist with assess necessary to complete an evaluation.  OT OCCUPATIONAL PROFILE AND HISTORY: Detailed assessment: Review of  records and additional review of physical, cognitive, psychosocial history related to current functional performance.  CLINICAL DECISION MAKING:  Moderate - several treatment options, min-mod task modification necessary  REHAB POTENTIAL: Good  EVALUATION COMPLEXITY: Moderate    PLAN:  OT FREQUENCY: 2x/week  OT DURATION: 12 weeks  PLANNED INTERVENTIONS: 97168 OT Re-evaluation, 97535 self care/ADL training, 02889 therapeutic exercise, 97530 therapeutic activity, 97112 neuromuscular re-education, 97140 manual therapy, 97018 paraffin, 02960 fluidotherapy, 97010 moist heat, 97010 cryotherapy, 97034 contrast bath, 97032 electrical stimulation (manual), 97760 Orthotic Initial, passive range of motion, energy conservation, coping strategies training, patient/family education, and DME and/or AE instructions  RECOMMENDED OTHER SERVICES: PT   CONSULTED AND AGREED WITH PLAN OF CARE: Patient  PLAN FOR NEXT SESSION: Treatment  Elaine Jagentenfl, MS, OTR/L   01/16/2024, 2:50 PM

## 2024-01-20 ENCOUNTER — Encounter

## 2024-01-21 ENCOUNTER — Ambulatory Visit

## 2024-01-21 DIAGNOSIS — R262 Difficulty in walking, not elsewhere classified: Secondary | ICD-10-CM

## 2024-01-21 DIAGNOSIS — R296 Repeated falls: Secondary | ICD-10-CM

## 2024-01-21 DIAGNOSIS — R278 Other lack of coordination: Secondary | ICD-10-CM

## 2024-01-21 DIAGNOSIS — R2689 Other abnormalities of gait and mobility: Secondary | ICD-10-CM

## 2024-01-21 DIAGNOSIS — M6281 Muscle weakness (generalized): Secondary | ICD-10-CM

## 2024-01-21 DIAGNOSIS — R269 Unspecified abnormalities of gait and mobility: Secondary | ICD-10-CM

## 2024-01-21 NOTE — Therapy (Signed)
 OUTPATIENT PHYSICAL THERAPY NEURO TREATMENT/    Patient Name: Erika Stanton MRN: [id number] DOB:12/15/1955, 68 y.o., female Today's Date: 01/21/2024   PCP: Dr. Oneil Galloway  REFERRING PROVIDER: Dr. Oneil Galloway  END OF SESSION:  PT End of Session - 01/21/24 1148     Visit Number 12    Number of Visits 24    Date for PT Re-Evaluation 03/03/24    Progress Note Due on Visit 20    PT Start Time 1148    Equipment Utilized During Treatment Gait belt    Activity Tolerance Patient tolerated treatment well    Behavior During Therapy WFL for tasks assessed/performed           Past Medical History:  Diagnosis Date   Anatomical narrow angle, bilateral    Aneurysm (HCC)    Asthma    Cerebral aneurysm    Cerebral vasospasm    Cognitive change    Dysphagia, post-stroke    GERD (gastroesophageal reflux disease)    Hemorrhoids    History of cervical dysplasia    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 no recurrent polyps.  suggest repeat surveillance study 11/2022.     COLONOSCOPY W/ POLYPECTOMY  06/2014   Dr Elder at [facility].  3 adenomatoous polyps, anal fissure.     ESOPHAGOGASTRODUODENOSCOPY (EGD) WITH PROPOFOL  N/A 01/27/2020   Procedure: ESOPHAGOGASTRODUODENOSCOPY (EGD) WITH PROPOFOL ;  Surgeon: Shila Gustav GAILS, MD;  Location: [facility] END[facility];  Service: Endoscopy;  Laterality: N/A;   INSERTION OF PERMANENT INTRAPERITONEAL CANNULA/CATH N/A    LAPAROSCOPIC   IR 3D INDEPENDENT WKST  01/06/2020   IR ANGIO INTRA EXTRACRAN SEL INTERNAL CAROTID BILAT MOD  SED  01/06/2020   IR ANGIO VERTEBRAL SEL VERTEBRAL UNI L MOD SED  01/06/2020   IR ANGIOGRAM FOLLOW UP STUDY  01/06/2020   IR ANGIOGRAM FOLLOW UP STUDY  01/06/2020   IR ANGIOGRAM FOLLOW UP STUDY  01/06/2020   IR ANGIOGRAM FOLLOW UP STUDY  01/06/2020   IR ANGIOGRAM FOLLOW UP STUDY  01/06/2020   IR ANGIOGRAM FOLLOW UP STUDY  01/06/2020   IR ANGIOGRAM FOLLOW UP STUDY  01/06/2020   IR ANGIOGRAM FOLLOW UP STUDY  01/06/2020   IR ANGIOGRAM FOLLOW UP STUDY  01/06/2020   IR ANGIOGRAM FOLLOW UP STUDY  01/06/2020   IR NEURO EACH ADD'L AFTER BASIC UNI RIGHT (MS)  01/06/2020   IR TRANSCATH/EMBOLIZ  01/06/2020   MASTECTOMY     MOHS SURGERY N/A    RADIOLOGY WITH ANESTHESIA N/A 01/06/2020   Procedure: IR WITH ANESTHESIA FOR ANEURYSM;  Surgeon: Lanis Pupa, MD;  Location: [facility] OR;  Service: Radiology;  Laterality: N/A;   STENT SUPPORTED EMBOLIZATION OF RIGHT ICA ANEURYSM Right    TUBAL LIGATION     VENTRICULO-PERITONEAL SHUNT PLACEMENT / LAPAROSCOPIC INSERTION PERITONEAL CATHETER N/A    Patient Active Problem List   Diagnosis Date Noted   Dyspareunia in female 04/05/2020   History of abnormal cervical Pap smear 03/10/2020   GIB (  gastrointestinal bleeding) 02/28/2020   Elevated BUN    Prediabetes    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   Cerebral vasospasm    Sinus tachycardia    Dysphagia, post-stroke    Thrombocytopenia (HCC)    Acute blood loss anemia    Brain aneurysm    ICH (intracerebral hemorrhage) (HCC) 01/05/2020   History of adenomatous polyp of colon 02/07/2016   Anatomical narrow angle, bilateral 12/14/2014   Fissure in ano 11/11/2014   Hemorrhoids 11/11/2014   Constipation 11/19/2013   GERD (gastroesophageal reflux disease) 03/24/2013   Irritable bowel syndrome with diarrhea 03/24/2013   Herpes zoster 05/14/2005   Abnormal Pap smear of cervix 05/15/1983   History of cervical dysplasia 05/15/1983    ONSET DATE: 12/02/2023  REFERRING DIAG: I63.9 (ICD-10-CM) - CVA  (cerebral vascular accident) (HCC)   THERAPY DIAG:  Muscle weakness (generalized)  Other lack of coordination  Abnormality of gait and mobility  Difficulty in walking, not elsewhere classified  Repeated falls  Other abnormalities of gait and mobility  Rationale for Evaluation and Treatment: Rehabilitation  SUBJECTIVE:                                                                                                                                                                                             SUBJECTIVE STATEMENT:  Pt reports that she is doing well. Has a little soreness in the R knee. Does not rate.  Is continuing to perform HEP daily at home. Reports no issues with current HEP  From EVAL:Patient reports she was in hospital from [facility]- Sat (July 21st- 25th) with CVA affecting left side. I was supposed to have a knee replacement surgery in September but that is on hold for now. I was doing my prep for my colonoscopy and fell in the bathroom.  Patient reports having mild left sided weakness compared to her CVA in 2021 with heavy Right sided UE weakness. She reports bad OA affecting both knees as well.    Pt accompanied by: Husband brought patient  PERTINENT HISTORY: Previous CVA in 2021 affecting Right side. Reports going to ED  last Monday due to fall/CVA affecting Left side. Diagnosed with R CVA with Left Sided weakness.    PAIN:  Are you having pain? Yes: NPRS scale: Current = 1/10; worst= 6/10 Pain location: Bilateral knees- Arthritis  Pain description: achy yet sharp with weight bearing Aggravating factors: Increased walking, transfers Relieving factors: rest  PRECAUTIONS: Fall  RED FLAGS: None   WEIGHT BEARING RESTRICTIONS: No  FALLS: Has patient fallen in last 6 months? Yes. Number of falls fell with the stroke  LIVING  ENVIRONMENT: Lives with: lives with their spouse Lives in: House/apartment Stairs: Yes: External: 2 steps; on left going up Has  following equipment at home: Single point cane, Walker - 2 wheeled, shower chair, and Grab bars  PLOF: Independent- Retired from [employer]  PATIENT GOALS: Walk without any assistive device.  OBJECTIVE:  Note: Objective measures were completed at Evaluation unless otherwise noted.  DIAGNOSTIC FINDINGS: MRI BRAIN WITHOUT CONTRAST  INDICATION: Neuro deficit, acute, stroke suspected, left sided facial weakness, I63.9 Cerebral infarction, unspecified (CMS/HHS-HCC)  COMPARISON: December 02, 2023 CTA head and neck  TECHNIQUE/PROTOCOL: Standard adult protocol brain MR without contrast.  FINDINGS: Brain Parenchyma: Multifocal areas of restricted diffusion within the right cerebral hemisphere; for example, within the right periventricular white matter (series 6 image 47), right posterior temporal lobe (series 6 image 44), inferior right temporal lobe (series 6 image 41), and right parietal lobe (series 6 image 50). No midline shift. Susceptibility artifact from left frontal approach ventriculostomy shunt catheter obscures a majority of the left hemisphere and susceptibility artifact from right ICA terminus coils portions of the right temporal lobe. Encephalomalacia around the coil pack and left frontal parietal lobe, unchanged. Area of FLAIR hyperintensity in the right cerebellar peduncle, likely representing wallerian degeneration. Scattered areas of increased T2/FLAIR signal intensity in the periventricular white matter, non-specific but likely the sequela of chronic small vessel ischemic disease. Ventricles and Sulci: Stable appearance of shunted ventricular system with left frontal approach ventriculostomy catheter with tip terminating near the septum pellucidum. Extra-Axial Spaces: No extra-axial fluid collection. Basal Cisterns: Normal. Intracranial Flow-Voids: Normal.  Paranasal Sinuses: Non-opacified. Mastoids: Normal. Orbits: Normal. Cranium: Multiple burr holes. Visualized upper  cervical spine: No high grade stenosis.   IMPRESSION: 1. Multifocal acute infarcts within the right MCA territory, as above. Distribution may reflect thromboembolic etiology from the more proximal right MCA. 2. Please note that considerable susceptibility artifact from left frontal approach ventriculostomy shunt catheter obscures a majority of the left hemisphere limiting evaluation and precluding assessment for infarct in these locations.  Findings were discussed communicated via Epic secure chat with Devora MD by Lang Payment, MD at 12/03/2023 12:50 AM.  The preliminary report (critical or emergent communication) was reviewed prior to this dictation and there are no substantial differences between the preliminary results and the impressions in this final report.  Electronically Reviewed by:  Metta Fruits, MD, [facility] Electronically Reviewed on:  12/03/2023 11:11 AM  I have reviewed the images and concur with the above findings.  Electronically Signed by:  Evalene Bigger, MD, [facility] Electronically Signed on:  12/03/2023 5:30 PM Procedure Note  Amrhein, Evalene Agent, MD - 12/03/2023 Formatting of this note might be different from the original. MRI BRAIN WITHOUT CONTRAST  INDICATION: Neuro deficit, acute, stroke suspected, left sided facial weakness, I63.9 Cerebral infarction, unspecified (CMS/HHS-HCC)  COMPARISON: December 02, 2023 CTA head and neck  TECHNIQUE/PROTOCOL: Standard adult protocol brain MR without contrast.  FINDINGS: Brain Parenchyma: Multifocal areas of restricted diffusion within the right cerebral hemisphere; for example, within the right periventricular white matter (series 6 image 47), right posterior temporal lobe (series 6 image 44), inferior right temporal lobe (series 6 image 41), and right parietal lobe (series 6 image 50). No midline shift. Susceptibility artifact from left frontal approach ventriculostomy shunt catheter obscures a majority  of the left hemisphere and susceptibility artifact from right ICA terminus coils portions of the right temporal lobe. Encephalomalacia around the coil pack and left frontal parietal lobe, unchanged. Area of FLAIR hyperintensity in  the right cerebellar peduncle, likely representing wallerian degeneration. Scattered areas of increased T2/FLAIR signal intensity in the periventricular white matter, non-specific but likely the sequela of chronic small vessel ischemic disease. Ventricles and Sulci: Stable appearance of shunted ventricular system with left frontal approach ventriculostomy catheter with tip terminating near the septum pellucidum. Extra-Axial Spaces: No extra-axial fluid collection. Basal Cisterns: Normal. Intracranial Flow-Voids: Normal.  Paranasal Sinuses: Non-opacified. Mastoids: Normal. Orbits: Normal. Cranium: Multiple burr holes. Visualized upper cervical spine: No high grade stenosis.   IMPRESSION: 1. Multifocal acute infarcts within the right MCA territory, as above. Distribution may reflect thromboembolic etiology from the more proximal right MCA. 2. Please note that considerable susceptibility artifact from left frontal approach ventriculostomy shunt catheter obscures a majority of the left hemisphere limiting evaluation and precluding assessment for infarct in these locations.  Findings were discussed communicated via Epic secure chat with Devora MD by Lang Payment, MD at 12/03/2023 12:50 AM.  The preliminary report (critical or emergent communication) was reviewed prior to this dictation and there are no substantial differences between the preliminary results and the impressions in this final report.  Electronically Reviewed by:  Metta Fruits, MD, [facility] Electronically Reviewed on:  12/03/2023 11:11 AM  I have reviewed the images and concur with the above findings.  Electronically Signed by:  Evalene Bigger, MD, [facility] Electronically Signed  on:  12/03/2023 5:30 PM Exam End: 12/03/23 00:10   Specimen Collected: 12/02/23 Last Resulted: 12/03/23 17:30  Received From: [facility]  Result Received: 12/09/23 16:07     COGNITION: Overall cognitive status: Within functional limits for tasks assessed   SENSATION: WFL  COORDINATION: Impaired - delayed each LE  EDEMA:  None observed   POSTURE: rounded shoulders and forward head  LOWER EXTREMITY ROM:     Active  Right Eval Left Eval  Hip flexion    Hip extension    Hip abduction    Hip adduction    Hip internal rotation    Hip external rotation    Knee flexion    Knee extension    Ankle dorsiflexion    Ankle plantarflexion    Ankle inversion    Ankle eversion     (Blank rows = not tested)  LOWER EXTREMITY MMT:    MMT Right Eval Left Eval  Hip flexion 4 4  Hip extension    Hip abduction    Hip adduction    Hip internal rotation 2- 2-  Hip external rotation 3+ 3+  Knee flexion 3+ 3+  Knee extension 4 4  Ankle dorsiflexion 4 4  Ankle plantarflexion    Ankle inversion    Ankle eversion    (Blank rows = not tested)  BED MOBILITY:  Not tested  TRANSFERS: Sit to stand: CGA  Assistive device utilized: Environmental consultant - 2 wheeled     Stand to sit: CGA  Assistive device utilized: Environmental consultant - 2 wheeled      RAMP:  Not tested  CURB:  Not tested  STAIRS: Not tested GAIT: Findings: Gait Characteristics: step to pattern, decreased arm swing- Right, decreased arm swing- Left, decreased step length- Right, decreased step length- Left, decreased stance time- Right, decreased stance time- Left, decreased stride length, decreased hip/knee flexion- Right, decreased hip/knee flexion- Left, decreased ankle dorsiflexion- Right, decreased ankle dorsiflexion- Left, wide BOS, poor foot clearance- Right, and poor foot clearance- Left, Distance walked: 60 feet, Assistive device utilized:Walker - 2 wheeled, Level of assistance: CGA, and Comments:    FUNCTIONAL  TESTS:  5 times sit to stand: 25.20 sec with LUE Support Timed up and go (TUG): 40 sec with RW (increased time to place right hand on walker)  6 minute walk test: 482' 10 meter walk test: 0.65 m/s Berg Balance Scale: 42/56    PATIENT SURVEYS:  Stroke Impact Scale - 65.63%                                                                                                                             TREATMENT DATE: 01/21/24  Patient arrives to session in transport chair.  Dynamic step tap onto 1/2 foam roll x 15 ea LE  Sit to stand - x 5 without UE and 3 more with light UE support - (each time standing up- walked approx 6 feet fwd/bwd without AD focusing on step sequencing- No LOB.  Resistive gait without AD wearing 2.5 #AW x  16ft  x 4 trials  Forward/reverse without AD 70ft x 4 each  Side stepping up/over 1/2 foam roll x 15 R and L with min UE support Sit<>stand x 8 with mild knee pain reported.   Throughout gait training PT instructed pt in relax the RUE to reduce bicep tone/ in flexion as well as neutral hip ER/IR to reduce inversion with heel contact on the RLE.  CGA for safety and gail belt in place throughout session unless otherwise noted. Pt demonstrated mild SOB with prolonged gait training, but able to complete without increased anxiety on this day.       PATIENT EDUCATION: Education details: PT plan of care; Discussion of symptoms of CVA and to go to ED as soon as possible. Purpose of PT for balance, strength, coordination and endurance.  Person educated: Patient Education method: Explanation Education comprehension: verbalized understanding  HOME EXERCISE PROGRAM: Access Code: [email or web address] URL: [email or web address] Date: 12/17/2023 Prepared by: Sidra Simpers  Program Notes **Be sure to perform all exercises next to a countertop or sturdy piece of furniture in case you become unsteady.  Exercises - Standing Heel Raise with Support  - 1 x daily - 7 x  weekly - 2 sets - 15 reps - Standing Toe Raises at Chair  - 1 x daily - 7 x weekly - 2 sets - 15 reps - Mini Squat with Counter Support  - 1 x daily - 7 x weekly - 2 sets - 15 reps - Lunge with Counter Support  - 1 x daily - 7 x weekly - 2 sets - 15 reps - Standing Tandem Balance with Counter Support  - 1 x daily - 7 x weekly - 2 sets - 2 reps - 30 hold - Standing Single Leg Stance with Counter Support  - 1 x daily - 7 x weekly - 2 sets - 2 reps - 30 hold  GOALS: Goals reviewed with patient? Yes  SHORT TERM GOALS: Target date: 01/21/2024  Pt will be independent with HEP in order to improve strength and balance  in order to decrease fall risk and improve function at home.  Baseline: EVAL - No current formal HEP in place Goal status: INITIAL   LONG TERM GOALS: Target date: 03/03/2024  1.  Patient will complete five times sit to stand test in < 15 seconds indicating an increased LE strength and improved balance. Baseline: EVAL= 25.20 sec with LUE Support 9/2: 23.36 without UE support and CGA-min assist. 20.10 sec with LUE support Goal status: INITIAL  2.  Patient will increase Berg Balance score by > 6 points to demonstrate decreased fall risk during functional activities. Baseline: EVAL: To be assessed next visit; 12/12/2023= 42/56 9/2: 44/56  Goal status: INITIAL   3.  Patient will reduce timed up and go to <11 seconds to reduce fall risk and demonstrate improved transfer/gait ability. Baseline: EVAL = 40 sec with RW 9/2: 18.33sec with SPC Goal status: INITIAL  4.  Patient will increase 10 meter walk test to >1.56m/s as to improve gait speed for better community ambulation and to reduce fall risk. Baseline: EVAL: 0.65 m/s 9/2: 0.67 m/s with [facility] 9/9: 0.71 m/s without an AD and wearing 2.5 # AW Goal status: INITIAL  5. Patient will increase six minute walk test distance by 200 feet for progression to community ambulator and improve gait ability Baseline: EVAL= 482' 9/2: 465 feet   using a SPC with CGA required seated rest break at 4:65min  Goal status: INITIAL   ASSESSMENT:  CLINICAL IMPRESSION: PT arrives to PT motivated to participate. PT treatment focused on dynamic gait training with cues for improved gait pattern to reduce hip IR and ankle inversion with heel contact as well as improved posture and reduced tone in the RUE. Pt was able to complete multiple bouts of prolonged gait training with mild SOB, but no increased anxiety on this day.  Pt will continue to benefit from skilled therapy to address remaining deficits in order to improve overall QoL and return to PLOF.     OBJECTIVE IMPAIRMENTS: Abnormal gait, decreased activity tolerance, decreased balance, decreased coordination, decreased endurance, decreased mobility, difficulty walking, decreased ROM, decreased strength, impaired UE functional use, and pain.   ACTIVITY LIMITATIONS: carrying, lifting, bending, sitting, standing, squatting, sleeping, stairs, and transfers  PARTICIPATION LIMITATIONS: meal prep, cleaning, laundry, driving, shopping, community activity, and yard work  PERSONAL FACTORS: 1-2 comorbidities: OA, previous CVA with significant R sided weakness are also affecting patient's functional outcome.   REHAB POTENTIAL: Good  CLINICAL DECISION MAKING: Stable/uncomplicated  EVALUATION COMPLEXITY: Moderate  PLAN:  PT FREQUENCY: 1-2x/week  PT DURATION: 12 weeks  PLANNED INTERVENTIONS: 97164- PT Re-evaluation, 97750- Physical Performance Testing, 97110-Therapeutic exercises, 97530- Therapeutic activity, [id number]- Neuromuscular re-education, 97535- Self Care, [id number]- Manual therapy, [id number]- Gait training, [phone]- Orthotic Initial, [phone]- Orthotic/Prosthetic subsequent, [phone]- Canalith repositioning, [phone]- Electrical stimulation (manual), [phone] (1-2 muscles), [phone] (3+ muscles)- Dry Needling, Patient/Family education, Balance training, Stair training, Taping, Joint mobilization, Joint manipulation,  Spinal manipulation, Spinal mobilization, Compression bandaging, Vestibular training, DME instructions, Cryotherapy, and Moist heat  PLAN FOR NEXT SESSION:   Continue  Curb step navigation training  B LE functional strengthening Sit<>stands Step-ups  Gait training with LRAD with increased activity tolerance.  Assess HEP compliance and progress as appropriate.  Massie Dollar PT, DPT  Physical Therapist - [facility]  11:48 AM 01/21/24

## 2024-01-23 ENCOUNTER — Ambulatory Visit: Admitting: Speech Pathology

## 2024-01-23 ENCOUNTER — Ambulatory Visit: Admitting: Occupational Therapy

## 2024-01-23 DIAGNOSIS — R41841 Cognitive communication deficit: Secondary | ICD-10-CM

## 2024-01-23 DIAGNOSIS — M6281 Muscle weakness (generalized): Secondary | ICD-10-CM

## 2024-01-23 NOTE — Therapy (Unsigned)
 OUTPATIENT SPEECH LANGUAGE PATHOLOGY  COGNITIVE COMMUNICATION EVALUATION   Patient Name: Erika Stanton MRN: [id number] DOB:18-Oct-1955, 68 y.o., female Today's Date: 01/23/2024  PCP: Oneil Galloway, MD REFERRING PROVIDER: Oneil Galloway, MD   End of Session - 01/23/24 1546     Visit Number 1    Number of Visits 1    Authorization Type [employer] Choice PPO    SLP Start Time 1450    SLP Stop Time  1525    SLP Time Calculation (min) 35 min    Activity Tolerance Patient tolerated treatment well          Past Medical History:  Diagnosis Date   Anatomical narrow angle, bilateral    Aneurysm (HCC)    Asthma    Cerebral aneurysm    Cerebral vasospasm    Cognitive change    Dysphagia, post-stroke    GERD (gastroesophageal reflux disease)    Hemorrhoids    History of cervical dysplasia    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 no recurrent polyps.  suggest repeat surveillance study 11/2022.     COLONOSCOPY W/ POLYPECTOMY  06/2014   Dr Elder at [facility].  3 adenomatoous polyps, anal fissure.     ESOPHAGOGASTRODUODENOSCOPY (EGD) WITH PROPOFOL  N/A 01/27/2020   Procedure: ESOPHAGOGASTRODUODENOSCOPY (EGD) WITH PROPOFOL ;  Surgeon: Shila Gustav GAILS, MD;  Location: [facility];  Service: Endoscopy;  Laterality: N/A;   INSERTION OF PERMANENT INTRAPERITONEAL CANNULA/CATH N/A    LAPAROSCOPIC   IR 3D INDEPENDENT WKST  01/06/2020   IR ANGIO INTRA EXTRACRAN SEL INTERNAL CAROTID BILAT MOD SED  01/06/2020   IR ANGIO VERTEBRAL SEL VERTEBRAL UNI L MOD SED   01/06/2020   IR ANGIOGRAM FOLLOW UP STUDY  01/06/2020   IR ANGIOGRAM FOLLOW UP STUDY  01/06/2020   IR ANGIOGRAM FOLLOW UP STUDY  01/06/2020   IR ANGIOGRAM FOLLOW UP STUDY  01/06/2020   IR ANGIOGRAM FOLLOW UP STUDY  01/06/2020   IR ANGIOGRAM FOLLOW UP STUDY  01/06/2020   IR ANGIOGRAM FOLLOW UP STUDY  01/06/2020   IR ANGIOGRAM FOLLOW UP STUDY  01/06/2020   IR ANGIOGRAM FOLLOW UP STUDY  01/06/2020   IR ANGIOGRAM FOLLOW UP STUDY  01/06/2020   IR NEURO EACH ADD'L AFTER BASIC UNI RIGHT (MS)  01/06/2020   IR TRANSCATH/EMBOLIZ  01/06/2020   MASTECTOMY     MOHS SURGERY N/A    RADIOLOGY WITH ANESTHESIA N/A 01/06/2020   Procedure: IR WITH ANESTHESIA FOR ANEURYSM;  Surgeon: Lanis Pupa, MD;  Location: [facility] OR;  Service: Radiology;  Laterality: N/A;   STENT SUPPORTED EMBOLIZATION OF RIGHT ICA ANEURYSM Right    TUBAL LIGATION     VENTRICULO-PERITONEAL SHUNT PLACEMENT / LAPAROSCOPIC INSERTION PERITONEAL CATHETER N/A    Patient Active Problem List   Diagnosis Date Noted   Dyspareunia in female 04/05/2020   History of abnormal cervical Pap smear 03/10/2020   GIB (gastrointestinal bleeding) 02/28/2020   Elevated BUN    Prediabetes    Cerebral  aneurysm rupture (HCC) 01/20/2020   Cerebral vasospasm    Sinus tachycardia    Dysphagia, post-stroke    Thrombocytopenia (HCC)    Acute blood loss anemia    Brain aneurysm    ICH (intracerebral hemorrhage) (HCC) 01/05/2020   History of adenomatous polyp of colon 02/07/2016   Anatomical narrow angle, bilateral 12/14/2014   Fissure in ano 11/11/2014   Hemorrhoids 11/11/2014   Constipation 11/19/2013   GERD (gastroesophageal reflux disease) 03/24/2013   Irritable bowel syndrome with diarrhea 03/24/2013   Herpes zoster 05/14/2005   Abnormal Pap smear of cervix 05/15/1983   History of cervical dysplasia 05/15/1983    ONSET DATE: 12/02/2023; date of referral 01/16/2024  REFERRING DIAG: R41.841 (ICD-10-CM) - Cognitive communication deficit    THERAPY DIAG:  Cognitive communication deficit  Rationale for Evaluation and Treatment Rehabilitation  SUBJECTIVE:   SUBJECTIVE STATEMENT: Pt pleasant, eager Pt accompanied by: self  PERTINENT HISTORY: Erika Stanton is a 68 y.o. female with a past medical history of prior L MCA stroke, bilateral ICA aneurysms s/p coiling, prior SAH and VP shunt placement([facility] at 5), breast cancer seen in stroke clinic for follow up after recent hospitalization from 12/02/2023-12/07/2023 for multifocal right MCA ischemic strokes.  Pt has participated in multiple courses of skilled ST services.   DIAGNOSTIC FINDINGS:  CTA Head and Neck w/ & w/o contrast, 12/02/23: IMPRESSION: 1. Unchanged coiling of the bilateral carotid termini and right ICA terminus clipping with significant streak artifact, right greater than left. Unchanged cutoff at the right carotid terminus with nonvisualization of the right M1 and A1 segments. Of note, there is filling of the right distal M3 branches above the level of streak artifact. 2. No hemodynamically significant arterial stenosis in the neck.   MRI Brain w/o contrast, 12/03/23: IMPRESSION:  1. Multifocal acute infarcts within the right MCA territory, as above.  Distribution may reflect thromboembolic etiology from the more proximal right MCA. 2. Please note that considerable susceptibility artifact from left frontal approach ventriculostomy shunt catheter obscures a majority of the left hemisphere limiting evaluation and precluding assessment for infarct in these locations.   PAIN:  Are you having pain? Monitored during the session   FALLS: Has patient fallen in last 6 months?  See PT evaluation for details  LIVING ENVIRONMENT: Lives with: lives with their spouse Lives in: House/apartment  PLOF:  Level of assistance: Needed assistance with ADLs, Needed assistance with IADLS Employment: On disability   PATIENT GOALS   to improve my confidence when I  am walking, to help me not be as emotional  OBJECTIVE:   COGNITIVE COMMUNICATION Overall cognitive status: History of cognitive impairments - at baseline Functional Impairments: Pt has the support of her husband with medication management, bill paying and use of paper/pencil calendar for appts  AUDITORY COMPREHENSION  Overall auditory comprehension: Appears intact YES/NO questions: Appears intact Following directions: Appears intact Conversation: Complex  READING COMPREHENSION: Intact  EXPRESSION: verbal  VERBAL EXPRESSION:   Overall verbal expression: Appears intact Level of generative/spontaneous verbalization: conversation Automatic speech: name: intact and social response: intact  Repetition: Appears intact Naming: Responsive: 76-100%, Confrontation: 76-100%, Convergent: 76-100%, and Divergent: 76-100% Pragmatics: Appears intact Non-verbal means of communication: N/A  MOTOR SPEECH: Overall motor speech: Appears intact Phonation: normal Resonance: WFL Articulation: Appears intact Intelligibility: Intelligible Motor planning: Appears intact  STANDARDIZED ASSESSMENTS: The Cognistat is a domain specific assessment of neurocognitive functioning in three general domains: 1.) consciousness, 2) orientation, and 3) simple attention; and five major domains 1) language,  2) constructional ability, 3) memory, 4) calculation skills,  and 5) executive skills.  The Cognistat has been vaildated for adolescents (12-19), adults (20-59), and seniors (60-84).   General Domains Consciousness Orientation Simple Attention  Five Major Domains Language Constructional ability Memory Calculation Skills Executive Skills  Pt's performance was within the normal range.   PATIENT EDUCATION: Education details: results of this assessment Person educated: Patient Education method: Explanation Education comprehension: verbalized understanding  ASSESSMENT:  CLINICAL IMPRESSION: Patient is  a 68 y.o. female who was seen today for a cognitive communication evaluation. At this time, pt presents with cognitive communication abilities that are considered to be at baseline. Pt is not able to identify any acute or recent changes with improvement observed in formal test measures. At this time, skilled ST services are not appropriate but would recommend pt consider counseling/referral to psychology.       Erika Stanton, M.S., CCC-SLP, Tree surgeon Certified Brain Injury Specialist [facility]  [facility] Rehabilitation Services Office [phone] Ascom [phone] Fax [phone]

## 2024-01-24 NOTE — Therapy (Addendum)
 OUTPATIENT OCCUPATIONAL THERAPY NEURO TREATMENT NOTE   Patient Name: Erika Stanton MRN: [id number] DOB:11-Apr-1956, 68 y.o., female Today's Date: 01/24/2024  PCP: Laurice Anes, MD REFERRING PROVIDER: Laurice Anes, MD   OT End of Session - 01/24/24 0012     Visit Number 11    Number of Visits 24    Date for OT Re-Evaluation 03/03/24    OT Start Time 1533    OT Stop Time 1615    OT Time Calculation (min) 42 min    Equipment Utilized During Treatment transport chair    Activity Tolerance Patient tolerated treatment well    Behavior During Therapy WFL for tasks assessed/performed          Past Medical History:  Diagnosis Date   Anatomical narrow angle, bilateral    Aneurysm (HCC)    Asthma    Cerebral aneurysm    Cerebral vasospasm    Cognitive change    Dysphagia, post-stroke    GERD (gastroesophageal reflux disease)    Hemorrhoids    History of cervical dysplasia    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 no recurrent polyps.  suggest repeat surveillance study 11/2022.     COLONOSCOPY W/ POLYPECTOMY  06/2014   Dr Elder at [facility].  3 adenomatoous polyps, anal fissure.     ESOPHAGOGASTRODUODENOSCOPY (EGD) WITH PROPOFOL  N/A 01/27/2020   Procedure: ESOPHAGOGASTRODUODENOSCOPY (EGD) WITH PROPOFOL ;  Surgeon: Shila Gustav GAILS, MD;  Location: [facility];  Service: Endoscopy;  Laterality: N/A;   INSERTION OF PERMANENT INTRAPERITONEAL CANNULA/CATH N/A    LAPAROSCOPIC   IR 3D INDEPENDENT WKST  01/06/2020   IR ANGIO INTRA EXTRACRAN SEL INTERNAL  CAROTID BILAT MOD SED  01/06/2020   IR ANGIO VERTEBRAL SEL VERTEBRAL UNI L MOD SED  01/06/2020   IR ANGIOGRAM FOLLOW UP STUDY  01/06/2020   IR ANGIOGRAM FOLLOW UP STUDY  01/06/2020   IR ANGIOGRAM FOLLOW UP STUDY  01/06/2020   IR ANGIOGRAM FOLLOW UP STUDY  01/06/2020   IR ANGIOGRAM FOLLOW UP STUDY  01/06/2020   IR ANGIOGRAM FOLLOW UP STUDY  01/06/2020   IR ANGIOGRAM FOLLOW UP STUDY  01/06/2020   IR ANGIOGRAM FOLLOW UP STUDY  01/06/2020   IR ANGIOGRAM FOLLOW UP STUDY  01/06/2020   IR ANGIOGRAM FOLLOW UP STUDY  01/06/2020   IR NEURO EACH ADD'L AFTER BASIC UNI RIGHT (MS)  01/06/2020   IR TRANSCATH/EMBOLIZ  01/06/2020   MASTECTOMY     MOHS SURGERY N/A    RADIOLOGY WITH ANESTHESIA N/A 01/06/2020   Procedure: IR WITH ANESTHESIA FOR ANEURYSM;  Surgeon: Lanis Pupa, MD;  Location: [facility] OR;  Service: Radiology;  Laterality: N/A;   STENT SUPPORTED EMBOLIZATION OF RIGHT ICA ANEURYSM Right    TUBAL LIGATION     VENTRICULO-PERITONEAL SHUNT PLACEMENT / LAPAROSCOPIC INSERTION PERITONEAL CATHETER N/A    Patient Active Problem List   Diagnosis Date Noted   Dyspareunia in female 04/05/2020   History of abnormal cervical Pap smear 03/10/2020  GIB (gastrointestinal bleeding) 02/28/2020   Elevated BUN    Prediabetes    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   Cerebral vasospasm    Sinus tachycardia    Dysphagia, post-stroke    Thrombocytopenia (HCC)    Acute blood loss anemia    Brain aneurysm    ICH (intracerebral hemorrhage) (HCC) 01/05/2020   History of adenomatous polyp of colon 02/07/2016   Anatomical narrow angle, bilateral 12/14/2014   Fissure in ano 11/11/2014   Hemorrhoids 11/11/2014   Constipation 11/19/2013   GERD (gastroesophageal reflux disease) 03/24/2013   Irritable bowel syndrome with diarrhea 03/24/2013   Herpes zoster 05/14/2005   Abnormal Pap smear of cervix 05/15/1983   History of cervical dysplasia 05/15/1983   ONSET DATE: 12/02/2023  REFERRING DIAG: L  ischemic CVA  THERAPY DIAG:  Muscle weakness (generalized)  Rationale for Evaluation and Treatment: Rehabilitation  SUBJECTIVE:  SUBJECTIVE STATEMENT: Pt. reports feeling pretty good today. Pt accompanied by: self  PERTINENT HISTORY:  Pt. Was admitted to [facility] from 12/02/23-12/06/23 with an Acute Right MCA CVA, Hydrocephalus with shunt  placement. History of Subarachnoid Hemorrhage with bilateral ICA coiled aneurysms. 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  PRECAUTIONS: None  WEIGHT BEARING RESTRICTIONS: No  PAIN:  01/23/24: no reports of pain 01/16/24: 4/10 bilat knees 01/14/24: No reports of pain 01/09/24: 4/10 pain bilat knees  Are you having pain? No  FALLS: Has patient fallen in last 6 months? Yes. Number of falls 1  LIVING ENVIRONMENT: Lives with: lives with their family and lives with their spouse Lives in: House/apartment Stairs: hand rails, 2 steps   Has following equipment at home: [name], cane  PLOF: Independent with basic ADLs, Spouse help as needed  PATIENT GOALS: Strengthening  OBJECTIVE:  Note: Objective measures were completed at Evaluation unless otherwise noted.  HAND DOMINANCE: Left  ADLs: Overall ADLs: spouse assists when needed, Pt. reports that she can do most tasks herself. Uses her left hand to engage in self-care tasks. Eating: Able to cut foods with a fork, husband assists as needed.  Grooming: husband assists with applying toothpaste, able to brush teeth and hair UB Dressing: Spouse assists with applying/fastening bra, Pt. Requires assistance with zippers, and buttons. LB Dressing: Spouse assists with tying shoes when needed, uses slip on shoes. Pt. Does not zip  pants. Toileting: Able to complete independently Bathing: Pt. Requires intermittent assistance with bathing tasks. Tub Shower transfers: Spouse assists with transfers when/if needed Equipment: Shower seat with back  IADLs: Shopping: Spouse typically does the shopping, isnt able to carry heavy objects.  Light housekeeping: Spouse typically does most of the cleaning around the home, Pt. Cleans bathroom, and laundry Pt. Is unable to carry laundry due to exacerbation of weakness.  Meal Prep: Spouse typically cooks and prepares meals.  Community mobility: Pt. Requires assistance using walker. Pt. reports that she is unable to carry heavy items, and typically does not do the shopping.  Medication management: Pt. is able to take care of meds with pillbox Financial management:  Spouse takes care of it, and makes online payments.  Handwriting: 50% legible  MOBILITY STATUS: Needs Assist: Pt. Requires use of a walker   POSTURE COMMENTS:  No Significant postural limitations Sitting balance: Good  ACTIVITY TOLERANCE: Activity tolerance:  FUNCTIONAL OUTCOME MEASURES: TBD  UPPER EXTREMITY ROM:    Active ROM Right eval Right 01/16/24 Left eval Left 01/16/24  Shoulder flexion 94(121) 100 with scaption (132) 102(125) 134 with scaption (145)  Shoulder abduction 75(106) 74 (98) 110(126) 140 (170)  Shoulder adduction      Shoulder extension      Shoulder internal rotation      Shoulder external rotation      Elbow flexion 135(155)  140(150)   Elbow extension 0  -10(-9)   Wrist flexion 40(44)  42(58)   Wrist extension 30(34)  40(50)   Wrist ulnar deviation      Wrist radial deviation      Wrist pronation      Wrist supination      (Blank rows = not tested)  UPPER EXTREMITY MMT:     MMT Right eval Left eval  Shoulder flexion 3-/5 3-/5  Shoulder abduction 3-/5 3-/5  Shoulder adduction    Shoulder extension    Shoulder internal rotation    Shoulder external rotation    Middle  trapezius    Lower trapezius    Elbow flexion 4-/5 4+/5  Elbow extension 4-/5 4+/5  Wrist flexion 4-/5 3+/5  Wrist extension 4-/5 3+/5  Wrist ulnar deviation    Wrist radial deviation    Wrist pronation    Wrist supination    (Blank rows = not tested)  HAND FUNCTION: Grip strength: Right: N/T lbs; Left: 43 lbs, Lateral pinch: Right: N/T lbs, Left: 9 lbs, and 3 point pinch: Right: N/T lbs, Left: 5 lbs Pt. Presents with flexor tightness in the right 5th digit, however is able to passively extend 5th digit within normal range. Pt. Also Presents with hyper extension in the 2nd and 3rd digit at PIP joints. Pt. Is able to achieve full Digit MP, PIP, and DIP PROM.  01/16/24: Grip strength: Left: 33 lbs; Lateral pinching: Left: 8 lbs; 3 point pinch: Left: 3 lbs  COORDINATION: 9 Hole Peg test: Right: N/T sec; Left: 51 sec 01/16/24: Left: 1 min 1 sec  SENSATION: Not tested  EDEMA:   MUSCLE TONE: Fluctuating flexor tone in R hand.  COGNITION: Overall cognitive status: Within functional limits for tasks assessed  VISION: Subjective report:  Baseline vision:  Visual history:   VISION ASSESSMENT:  PERCEPTION:   PRAXIS:   OBSERVATIONS: Pt. presents with fluctuating flexor tone in the R hand, involving the 5th digit at the PIP/DIP joint. Pt. Presents with hyperextension at the 2nd and 3rd digits at the PIP joints.                                                                                                                    TREATMENT DATE: 01/23/24  Therapeutic Ex.:   -Pt. tolerated right hand PROM/AAROM through all joint ranges.   Therapeutic Activities:   -facilitated left hand Lateral, and 3pt. Pinch strengthening using yellow, red, green, and blue level resistive clips in combination with bilateral motor control skills transferring them between  her hands. Pt grasped the clips with the right hand transferred them to the left hand for pinch strengthening. After transferring them  back to the right hand, she reached out in front of her, then to the far right to discard them into a container placed at the tabletop. Pt. Continues to require step-by-step cues, and cues for hand clip position, and for each step of the task.  Self-care:  Pt. worked on Mining engineer with the left hand attempting to formulate the letters of her name.  PATIENT EDUCATION: Education details:  left hand function, bimanual dexterity, writing skills Person educated: Patient Education method: Explanation and Verbal cues Education comprehension: verbalized understanding and needs further education  HOME EXERCISE PROGRAM: -Pink theraputty; visual handout issued -Self and caregiver assisted PROM to R hand and wrist  GOALS: Goals reviewed with patient? Yes  SHORT TERM GOALS: Target date: 01/21/2024    Pt. Will be independent with HEP for UE functioning. Baseline: Eval:No current HEP; 01/16/24: pt reports doing a little writing and squeezing a ball, but denies using putty; spouse stretches RUE daily Goal status: in progress  LONG TERM GOALS: Target date: 03/03/2024    Pt. Will improve [facility] skills to be able to be able to manipulate small objects at home Baseline: 9 hole peg test: L: 51 seconds; 01/16/24: L 1 min 1 sec Goal status: in progress  2.  Pt. Will increase BUE shoulder ROM by 10 degrees to be able to reach into cabinets and shelves.  Baseline: Shoulder flexion: R: 94(121), L: 102(125), Shoulder abduction: R: 75(106), L: 110(126); 01/16/24: Shoulder flexion: R: 100 with scaption (132), L: 134 with scaption (145), Shoulder abduction: R: 74 (98), L: 140 (170) Goal status: in progress  3.  Pt. Will improve L grip strength by 5# to be able to securely grasp ADL items at home.  Baseline: Grip strength: R: NT, L: 43#; 01/16/24: L: 33# Goal status: in progress  4.  Pt. Will increase L lateral key pinch strength by 3#  to open wide mouth jars.  Baseline: Lateral key pinch: R: NT, L: 9#; 01/16/24:  L: 8# Goal status: in progress  5.  Pt. Will independently engage the right hand as a gross assist to the left hand 100% of the time during ADLs/IADLs. Baseline: Eval: Pt. With limited engagement of the right hand as a gross assist during tasks; 01/16/24: Observed <25% of the time during OT sessions  Goal status: in progress  6.  Pt. will improve left hand [facility] skills by 3 sec. of speed to be able to manipulate zippers, and buttons efficiently. Baseline: Eval:Right: NT Left: 51 sec; 01/16/24: Left: 1 min and 1 sec  Goal status: in progress  7. Pt. Will improve handwriting legibility to 100% in printed form for written correspondence.  Baseline: 50% legible in printed form for first name only; 01/16/24: 80-90% legible to print first name 1 of 4 attempts.  3 of 4 attempts 50% or less legible Goal status: in progress  ASSESSMENT:  CLINICAL IMPRESSION:  Pt. required step-by-step cues during each task, as well as tactile cues, and cues for visual demonstration of the proper hand pinch position on each of the clips. Writing legibility was less than 25% this afternoon to formulate the lets of her name. Pt. required assist to position the pen with the PenGrip in place. Pt. continues to benefit from OT services to increase BUE ROM in the shoulder, wrist and hands, as well as increasing L grip/pinch strength and  improve [facility] skills to be able to perform her desired ADL/IADLs.   PERFORMANCE DEFICITS: in functional skills including ADLs, IADLs, coordination, dexterity, edema, tone, ROM, strength, pain, Fine motor control, Gross motor control, endurance, and UE functional use, and psychosocial skills including coping strategies, environmental adaptation, habits, and routines and behaviors.   IMPAIRMENTS: are limiting patient from ADLs, IADLs, rest and sleep, leisure, and social participation.   CO-MORBIDITIES: may have co-morbidities  that affects occupational performance. Patient will benefit from skilled OT to  address above impairments and improve overall function.  MODIFICATION OR ASSISTANCE TO COMPLETE EVALUATION: Min-Moderate modification of tasks or assist with assess necessary to complete an evaluation.  OT OCCUPATIONAL PROFILE AND HISTORY: Detailed assessment: Review of records and additional review of physical, cognitive, psychosocial history related to current functional performance.  CLINICAL DECISION MAKING: Moderate - several treatment options, min-mod task modification necessary  REHAB POTENTIAL: Good  EVALUATION COMPLEXITY: Moderate    PLAN:  OT FREQUENCY: 2x/week  OT DURATION: 12 weeks  PLANNED INTERVENTIONS: 97168 OT Re-evaluation, 97535 self care/ADL training, 02889 therapeutic exercise, 97530 therapeutic activity, 97112 neuromuscular re-education, 97140 manual therapy, 97018 paraffin, 02960 fluidotherapy, 97010 moist heat, 97010 cryotherapy, 97034 contrast bath, 97032 electrical stimulation (manual), 97760 Orthotic Initial, passive range of motion, energy conservation, coping strategies training, patient/family education, and DME and/or AE instructions  RECOMMENDED OTHER SERVICES: PT   CONSULTED AND AGREED WITH PLAN OF CARE: Patient  PLAN FOR NEXT SESSION: Treatment  Richardson Otter, MS, OTR/L   01/24/2024, 12:15 AM

## 2024-01-27 ENCOUNTER — Ambulatory Visit

## 2024-01-27 ENCOUNTER — Ambulatory Visit: Admitting: Physical Therapy

## 2024-01-27 ENCOUNTER — Ambulatory Visit: Admitting: Speech Pathology

## 2024-01-27 DIAGNOSIS — R262 Difficulty in walking, not elsewhere classified: Secondary | ICD-10-CM

## 2024-01-27 DIAGNOSIS — M6281 Muscle weakness (generalized): Secondary | ICD-10-CM

## 2024-01-27 DIAGNOSIS — R278 Other lack of coordination: Secondary | ICD-10-CM

## 2024-01-27 DIAGNOSIS — R269 Unspecified abnormalities of gait and mobility: Secondary | ICD-10-CM

## 2024-01-27 DIAGNOSIS — R296 Repeated falls: Secondary | ICD-10-CM

## 2024-01-27 NOTE — Therapy (Signed)
 OUTPATIENT OCCUPATIONAL THERAPY NEURO TREATMENT NOTE   Patient Name: Erika Stanton MRN: [id number] DOB:Mar 05, 1956, 68 y.o., female Today's Date: 01/27/2024  PCP: Laurice Anes, MD REFERRING PROVIDER: Laurice Anes, MD   OT End of Session - 01/27/24 1015     Visit Number 12    Number of Visits 24    Date for OT Re-Evaluation 03/03/24    Authorization Time Period Reporting period beginning 01/16/24    OT Start Time 1015    OT Stop Time 1100    OT Time Calculation (min) 45 min    Equipment Utilized During Treatment transport chair    Activity Tolerance Patient tolerated treatment well    Behavior During Therapy WFL for tasks assessed/performed         Past Medical History:  Diagnosis Date   Anatomical narrow angle, bilateral    Aneurysm (HCC)    Asthma    Cerebral aneurysm    Cerebral vasospasm    Cognitive change    Dysphagia, post-stroke    GERD (gastroesophageal reflux disease)    Hemorrhoids    History of cervical dysplasia    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 no recurrent polyps.  suggest repeat surveillance study 11/2022.     COLONOSCOPY W/ POLYPECTOMY  06/2014   Dr Elder at [facility].  3 adenomatoous polyps, anal fissure.     ESOPHAGOGASTRODUODENOSCOPY (EGD) WITH PROPOFOL  N/A 01/27/2020   Procedure: ESOPHAGOGASTRODUODENOSCOPY (EGD) WITH PROPOFOL ;  Surgeon: Shila Gustav GAILS, MD;  Location: [facility];  Service: Endoscopy;  Laterality: N/A;   INSERTION OF PERMANENT INTRAPERITONEAL CANNULA/CATH N/A    LAPAROSCOPIC   IR 3D  INDEPENDENT WKST  01/06/2020   IR ANGIO INTRA EXTRACRAN SEL INTERNAL CAROTID BILAT MOD SED  01/06/2020   IR ANGIO VERTEBRAL SEL VERTEBRAL UNI L MOD SED  01/06/2020   IR ANGIOGRAM FOLLOW UP STUDY  01/06/2020   IR ANGIOGRAM FOLLOW UP STUDY  01/06/2020   IR ANGIOGRAM FOLLOW UP STUDY  01/06/2020   IR ANGIOGRAM FOLLOW UP STUDY  01/06/2020   IR ANGIOGRAM FOLLOW UP STUDY  01/06/2020   IR ANGIOGRAM FOLLOW UP STUDY  01/06/2020   IR ANGIOGRAM FOLLOW UP STUDY  01/06/2020   IR ANGIOGRAM FOLLOW UP STUDY  01/06/2020   IR ANGIOGRAM FOLLOW UP STUDY  01/06/2020   IR ANGIOGRAM FOLLOW UP STUDY  01/06/2020   IR NEURO EACH ADD'L AFTER BASIC UNI RIGHT (MS)  01/06/2020   IR TRANSCATH/EMBOLIZ  01/06/2020   MASTECTOMY     MOHS SURGERY N/A    RADIOLOGY WITH ANESTHESIA N/A 01/06/2020   Procedure: IR WITH ANESTHESIA FOR ANEURYSM;  Surgeon: Lanis Pupa, MD;  Location: [facility] OR;  Service: Radiology;  Laterality: N/A;   STENT SUPPORTED EMBOLIZATION OF RIGHT ICA ANEURYSM Right    TUBAL LIGATION     VENTRICULO-PERITONEAL SHUNT PLACEMENT / LAPAROSCOPIC INSERTION PERITONEAL CATHETER N/A    Patient Active Problem List   Diagnosis Date Noted   Dyspareunia in female 04/05/2020  History of abnormal cervical Pap smear 03/10/2020   GIB (gastrointestinal bleeding) 02/28/2020   Elevated BUN    Prediabetes    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   Cerebral vasospasm    Sinus tachycardia    Dysphagia, post-stroke    Thrombocytopenia (HCC)    Acute blood loss anemia    Brain aneurysm    ICH (intracerebral hemorrhage) (HCC) 01/05/2020   History of adenomatous polyp of colon 02/07/2016   Anatomical narrow angle, bilateral 12/14/2014   Fissure in ano 11/11/2014   Hemorrhoids 11/11/2014   Constipation 11/19/2013   GERD (gastroesophageal reflux disease) 03/24/2013   Irritable bowel syndrome with diarrhea 03/24/2013   Herpes zoster 05/14/2005   Abnormal Pap smear of cervix 05/15/1983   History of cervical  dysplasia 05/15/1983   ONSET DATE: 12/02/2023  REFERRING DIAG: L ischemic CVA  THERAPY DIAG:  Muscle weakness (generalized)  Other lack of coordination  Rationale for Evaluation and Treatment: Rehabilitation  SUBJECTIVE:  SUBJECTIVE STATEMENT: Pt reports that her weekend was ok. Pt accompanied by: self  PERTINENT HISTORY:  Pt. Was admitted to [facility] from 12/02/23-12/06/23 with an Acute Right MCA CVA, Hydrocephalus with shunt  placement. History of Subarachnoid Hemorrhage with bilateral ICA coiled aneurysms. 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  PRECAUTIONS: None  WEIGHT BEARING RESTRICTIONS: No  PAIN:    01/27/24: 4/10 bilat knees 01/23/24: no reports of pain 01/16/24: 4/10 bilat knees 01/14/24: No reports of pain 01/09/24: 4/10 pain bilat knees  Are you having pain? No  FALLS: Has patient fallen in last 6 months? Yes. Number of falls 1  LIVING ENVIRONMENT: Lives with: lives with their family and lives with their spouse Lives in: House/apartment Stairs: hand rails, 2 steps   Has following equipment at home: [name], cane  PLOF: Independent with basic ADLs, Spouse help as needed  PATIENT GOALS: Strengthening  OBJECTIVE:  Note: Objective measures were completed at Evaluation unless otherwise noted.  HAND DOMINANCE: Left  ADLs: Overall ADLs: spouse assists when needed, Pt. reports that she can do most tasks herself. Uses her left hand to engage in self-care tasks. Eating: Able to cut foods with a fork, husband assists as needed.  Grooming: husband assists with applying toothpaste, able to brush teeth and hair UB Dressing: Spouse assists with applying/fastening bra, Pt. Requires assistance with  zippers, and buttons. LB Dressing: Spouse assists with tying shoes when needed, uses slip on shoes. Pt. Does not zip pants. Toileting: Able to complete independently Bathing: Pt. Requires intermittent assistance with bathing tasks. Tub Shower transfers: Spouse assists with transfers when/if needed Equipment: Shower seat with back  IADLs: Shopping: Spouse typically does the shopping, isnt able to carry heavy objects.  Light housekeeping: Spouse typically does most of the cleaning around the home, Pt. Cleans bathroom, and laundry Pt. Is unable to carry laundry due to exacerbation of weakness.  Meal Prep: Spouse typically cooks and prepares meals.  Community mobility: Pt. Requires assistance using walker. Pt. reports that she is unable to carry heavy items, and typically does not do the shopping.  Medication management: Pt. is able to take care of meds with pillbox Financial management:  Spouse takes care of it, and makes online payments.  Handwriting: 50% legible  MOBILITY STATUS: Needs Assist: Pt. Requires use  of a walker   POSTURE COMMENTS:  No Significant postural limitations Sitting balance: Good  ACTIVITY TOLERANCE: Activity tolerance:   FUNCTIONAL OUTCOME MEASURES: TBD  UPPER EXTREMITY ROM:    Active ROM Right eval Right 01/16/24 Left eval Left 01/16/24  Shoulder flexion 94(121) 100 with scaption (132) 102(125) 134 with scaption (145)  Shoulder abduction 75(106) 74 (98) 110(126) 140 (170)  Shoulder adduction      Shoulder extension      Shoulder internal rotation      Shoulder external rotation      Elbow flexion 135(155)  140(150)   Elbow extension 0  -10(-9)   Wrist flexion 40(44)  42(58)   Wrist extension 30(34)  40(50)   Wrist ulnar deviation      Wrist radial deviation      Wrist pronation      Wrist supination      (Blank rows = not tested)  UPPER EXTREMITY MMT:     MMT Right eval Left eval  Shoulder flexion 3-/5 3-/5  Shoulder abduction 3-/5 3-/5   Shoulder adduction    Shoulder extension    Shoulder internal rotation    Shoulder external rotation    Middle trapezius    Lower trapezius    Elbow flexion 4-/5 4+/5  Elbow extension 4-/5 4+/5  Wrist flexion 4-/5 3+/5  Wrist extension 4-/5 3+/5  Wrist ulnar deviation    Wrist radial deviation    Wrist pronation    Wrist supination    (Blank rows = not tested)  HAND FUNCTION: Grip strength: Right: N/T lbs; Left: 43 lbs, Lateral pinch: Right: N/T lbs, Left: 9 lbs, and 3 point pinch: Right: N/T lbs, Left: 5 lbs Pt. Presents with flexor tightness in the right 5th digit, however is able to passively extend 5th digit within normal range. Pt. Also Presents with hyper extension in the 2nd and 3rd digit at PIP joints. Pt. Is able to achieve full Digit MP, PIP, and DIP PROM.  01/16/24: Grip strength: Left: 33 lbs; Lateral pinching: Left: 8 lbs; 3 point pinch: Left: 3 lbs  COORDINATION: 9 Hole Peg test: Right: N/T sec; Left: 51 sec 01/16/24: Left: 1 min 1 sec  SENSATION: Not tested  EDEMA:   MUSCLE TONE: Fluctuating flexor tone in R hand.  COGNITION: Overall cognitive status: Within functional limits for tasks assessed  VISION: Subjective report:  Baseline vision:  Visual history:   VISION ASSESSMENT:  PERCEPTION:   PRAXIS:   OBSERVATIONS: Pt. presents with fluctuating flexor tone in the R hand, involving the 5th digit at the PIP/DIP joint. Pt. Presents with hyperextension at the 2nd and 3rd digits at the PIP joints.                                                                                                                    TREATMENT DATE: 01/27/24 Therapeutic Activity: -L hand strengthening/coordination training facilitated with pt manipulating theraputty for activity set up below (pushing, pinching, rolling putty)  Self Care: -Positioning education/implementation (  trial of pan splint for R hand during activities noted below, thumb was positioned in complete radial  adduction in this splint to improve tolerance to positioning; transitioned to R hand wrapped around ball for more tolerable stretch after ~10 min, using Ace wrap to secure ball in palm).  Encouraged hand wrapped around ball or rolled washcloth in palm during the day for optimal hand positioning at rest, as pt verbalizes only wanting to wear her Softpro resting hand splint for 1-1.5 hours at night time. -Practiced fork/knife skills for self feeding simulation:  -pt used rocker knife to cut putty; mod vc for technique and hand position.  -simulated spreading butter using table knife and putty, with and without built up foam handle  -practiced cutting putty with table knife with and without built up foam handle  -practiced varying hand positions for grasping fork: tines up/down while stabbing putty pieces  -practiced stabbing putty pieces and performing hand to mouth patterns with and without built up foam handle around fork   PATIENT EDUCATION: Education details:  left hand function, bimanual dexterity, writing skills Person educated: Patient Education method: Explanation and Verbal cues Education comprehension: verbalized understanding and needs further education  HOME EXERCISE PROGRAM: -Pink theraputty; visual handout issued -Self and caregiver assisted PROM to R hand and wrist  GOALS: Goals reviewed with patient? Yes  SHORT TERM GOALS: Target date: 01/21/2024    Pt. Will be independent with HEP for UE functioning. Baseline: Eval:No current HEP; 01/16/24: pt reports doing a little writing and squeezing a ball, but denies using putty; spouse stretches RUE daily Goal status: in progress  LONG TERM GOALS: Target date: 03/03/2024    Pt. Will improve [facility] skills to be able to be able to manipulate small objects at home Baseline: 9 hole peg test: L: 51 seconds; 01/16/24: L 1 min 1 sec Goal status: in progress  2.  Pt. Will increase BUE shoulder ROM by 10 degrees to be able to reach into  cabinets and shelves.  Baseline: Shoulder flexion: R: 94(121), L: 102(125), Shoulder abduction: R: 75(106), L: 110(126); 01/16/24: Shoulder flexion: R: 100 with scaption (132), L: 134 with scaption (145), Shoulder abduction: R: 74 (98), L: 140 (170) Goal status: in progress  3.  Pt. Will improve L grip strength by 5# to be able to securely grasp ADL items at home.  Baseline: Grip strength: R: NT, L: 43#; 01/16/24: L: 33# Goal status: in progress  4.  Pt. Will increase L lateral key pinch strength by 3#  to open wide mouth jars.  Baseline: Lateral key pinch: R: NT, L: 9#; 01/16/24: L: 8# Goal status: in progress  5.  Pt. Will independently engage the right hand as a gross assist to the left hand 100% of the time during ADLs/IADLs. Baseline: Eval: Pt. With limited engagement of the right hand as a gross assist during tasks; 01/16/24: Observed <25% of the time during OT sessions  Goal status: in progress  6.  Pt. will improve left hand [facility] skills by 3 sec. of speed to be able to manipulate zippers, and buttons efficiently. Baseline: Eval:Right: NT Left: 51 sec; 01/16/24: Left: 1 min and 1 sec  Goal status: in progress  7. Pt. Will improve handwriting legibility to 100% in printed form for written correspondence.  Baseline: 50% legible in printed form for first name only; 01/16/24: 80-90% legible to print first name 1 of 4 attempts.  3 of 4 attempts 50% or less legible Goal status: in progress  ASSESSMENT: CLINICAL IMPRESSION: Ongoing R hand positioning education reviewed today.  OT encouraged pt to begin bringing her oval 8 splints to therapy each time as R hand digits appeared more hyperextended than usual.  Pt tolerated pan splint for ~10 min before transition to resting R hand with ball in palm.  OT encouraged use of ball or rolled washcloth for reducing risk of contractures and skin breakdown as she does not prefer to wear her softpro resting hand splint during the day.  Fork and [employer]  practiced as noted above.  Pt does better to maintain grasp of each utensil using built up foam grip around fork/knife, but pt verbalizes that she always wants to look as normal as possible, and prefers to use her standard utensils at home.  Pt requires extra time, vc for correcting knife position as it slips in her hand, and frequent repositioning of fork d/t weak grasp without built up handles.  Pt states that her spouse helps her to cut everything at home, and though pt acknowledges that it would be nice to cut her own food, she demonstrates limited motivation for activity compensation/AE which would allow increased independence.  Pt. continues to benefit from OT services to increase BUE ROM in the shoulder, wrist and hands, as well as increasing L grip/pinch strength and improve [facility] skills to be able to perform her desired ADL/IADLs.   PERFORMANCE DEFICITS: in functional skills including ADLs, IADLs, coordination, dexterity, edema, tone, ROM, strength, pain, Fine motor control, Gross motor control, endurance, and UE functional use, and psychosocial skills including coping strategies, environmental adaptation, habits, and routines and behaviors.   IMPAIRMENTS: are limiting patient from ADLs, IADLs, rest and sleep, leisure, and social participation.   CO-MORBIDITIES: may have co-morbidities  that affects occupational performance. Patient will benefit from skilled OT to address above impairments and improve overall function.  MODIFICATION OR ASSISTANCE TO COMPLETE EVALUATION: Min-Moderate modification of tasks or assist with assess necessary to complete an evaluation.  OT OCCUPATIONAL PROFILE AND HISTORY: Detailed assessment: Review of records and additional review of physical, cognitive, psychosocial history related to current functional performance.  CLINICAL DECISION MAKING: Moderate - several treatment options, min-mod task modification necessary  REHAB POTENTIAL: Good  EVALUATION COMPLEXITY:  Moderate    PLAN:  OT FREQUENCY: 2x/week  OT DURATION: 12 weeks  PLANNED INTERVENTIONS: 97168 OT Re-evaluation, 97535 self care/ADL training, 02889 therapeutic exercise, 97530 therapeutic activity, 97112 neuromuscular re-education, 97140 manual therapy, 97018 paraffin, 02960 fluidotherapy, 97010 moist heat, 97010 cryotherapy, 97034 contrast bath, 97032 electrical stimulation (manual), 97760 Orthotic Initial, passive range of motion, energy conservation, coping strategies training, patient/family education, and DME and/or AE instructions  RECOMMENDED OTHER SERVICES: PT   CONSULTED AND AGREED WITH PLAN OF CARE: Patient  PLAN FOR NEXT SESSION: Treatment  Inocente Blazing, MS, OTR/L  01/27/2024, 11:51 AM

## 2024-01-27 NOTE — Therapy (Signed)
 OUTPATIENT PHYSICAL THERAPY NEURO TREATMENT   Patient Name: Erika Stanton MRN: [id number] DOB:July 27, 1955, 68 y.o., female Today's Date: 01/27/2024   PCP: Dr. Oneil Galloway  REFERRING PROVIDER: Dr. Oneil Galloway  END OF SESSION:  PT End of Session - 01/27/24 0932     Visit Number 13    Number of Visits 24    Date for PT Re-Evaluation 03/03/24    Progress Note Due on Visit 20    PT Start Time 0933    PT Stop Time 1015    PT Time Calculation (min) 42 min    Equipment Utilized During Treatment Gait belt    Activity Tolerance Patient tolerated treatment well    Behavior During Therapy WFL for tasks assessed/performed           Past Medical History:  Diagnosis Date   Anatomical narrow angle, bilateral    Aneurysm (HCC)    Asthma    Cerebral aneurysm    Cerebral vasospasm    Cognitive change    Dysphagia, post-stroke    GERD (gastroesophageal reflux disease)    Hemorrhoids    History of cervical dysplasia    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 no recurrent polyps.  suggest repeat surveillance study 11/2022.     COLONOSCOPY W/ POLYPECTOMY  06/2014   Dr Elder at [facility].  3 adenomatoous polyps, anal fissure.     ESOPHAGOGASTRODUODENOSCOPY (EGD) WITH PROPOFOL  N/A 01/27/2020   Procedure: ESOPHAGOGASTRODUODENOSCOPY (EGD) WITH PROPOFOL ;  Surgeon: Shila Gustav GAILS, MD;  Location: [facility];  Service: Endoscopy;  Laterality: N/A;   INSERTION OF PERMANENT INTRAPERITONEAL CANNULA/CATH N/A    LAPAROSCOPIC   IR 3D INDEPENDENT WKST  01/06/2020    IR ANGIO INTRA EXTRACRAN SEL INTERNAL CAROTID BILAT MOD SED  01/06/2020   IR ANGIO VERTEBRAL SEL VERTEBRAL UNI L MOD SED  01/06/2020   IR ANGIOGRAM FOLLOW UP STUDY  01/06/2020   IR ANGIOGRAM FOLLOW UP STUDY  01/06/2020   IR ANGIOGRAM FOLLOW UP STUDY  01/06/2020   IR ANGIOGRAM FOLLOW UP STUDY  01/06/2020   IR ANGIOGRAM FOLLOW UP STUDY  01/06/2020   IR ANGIOGRAM FOLLOW UP STUDY  01/06/2020   IR ANGIOGRAM FOLLOW UP STUDY  01/06/2020   IR ANGIOGRAM FOLLOW UP STUDY  01/06/2020   IR ANGIOGRAM FOLLOW UP STUDY  01/06/2020   IR ANGIOGRAM FOLLOW UP STUDY  01/06/2020   IR NEURO EACH ADD'L AFTER BASIC UNI RIGHT (MS)  01/06/2020   IR TRANSCATH/EMBOLIZ  01/06/2020   MASTECTOMY     MOHS SURGERY N/A    RADIOLOGY WITH ANESTHESIA N/A 01/06/2020   Procedure: IR WITH ANESTHESIA FOR ANEURYSM;  Surgeon: Lanis Pupa, MD;  Location: [facility] OR;  Service: Radiology;  Laterality: N/A;   STENT SUPPORTED EMBOLIZATION OF RIGHT ICA ANEURYSM Right    TUBAL LIGATION     VENTRICULO-PERITONEAL SHUNT PLACEMENT / LAPAROSCOPIC INSERTION PERITONEAL CATHETER N/A    Patient Active Problem List   Diagnosis Date Noted   Dyspareunia  in female 04/05/2020   History of abnormal cervical Pap smear 03/10/2020   GIB (gastrointestinal bleeding) 02/28/2020   Elevated BUN    Prediabetes    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   Cerebral vasospasm    Sinus tachycardia    Dysphagia, post-stroke    Thrombocytopenia (HCC)    Acute blood loss anemia    Brain aneurysm    ICH (intracerebral hemorrhage) (HCC) 01/05/2020   History of adenomatous polyp of colon 02/07/2016   Anatomical narrow angle, bilateral 12/14/2014   Fissure in ano 11/11/2014   Hemorrhoids 11/11/2014   Constipation 11/19/2013   GERD (gastroesophageal reflux disease) 03/24/2013   Irritable bowel syndrome with diarrhea 03/24/2013   Herpes zoster 05/14/2005   Abnormal Pap smear of cervix 05/15/1983   History of cervical dysplasia 05/15/1983    ONSET  DATE: 12/02/2023  REFERRING DIAG: I63.9 (ICD-10-CM) - CVA (cerebral vascular accident) (HCC)   THERAPY DIAG:  Muscle weakness (generalized)  Other lack of coordination  Abnormality of gait and mobility  Difficulty in walking, not elsewhere classified  Repeated falls  Rationale for Evaluation and Treatment: Rehabilitation  SUBJECTIVE:                                                                                                                                                                                             SUBJECTIVE STATEMENT:   Pt reports doing okay. Reports that she did not do much of the weekend. Mild pain in bil knees. Rates 4/10 on this day.   From EVAL:Patient reports she was in hospital from [facility]- Sat (July 21st- 25th) with CVA affecting left side. I was supposed to have a knee replacement surgery in September but that is on hold for now. I was doing my prep for my colonoscopy and fell in the bathroom.  Patient reports having mild left sided weakness compared to her CVA in 2021 with heavy Right sided UE weakness. She reports bad OA affecting both knees as well.    Pt accompanied by: Husband brought patient  PERTINENT HISTORY: Previous CVA in 2021 affecting Right side. Reports going to ED  last Monday due to fall/CVA affecting Left side. Diagnosed with R CVA with Left Sided weakness.    PAIN:  Are you having pain? Yes: NPRS scale: Current = 1/10; worst= 6/10 Pain location: Bilateral knees- Arthritis  Pain description: achy yet sharp with weight bearing Aggravating factors: Increased walking, transfers Relieving factors: rest  PRECAUTIONS: Fall  RED FLAGS: None   WEIGHT BEARING RESTRICTIONS: No  FALLS: Has patient fallen in last 6 months? Yes. Number of falls fell with the stroke  LIVING ENVIRONMENT: Lives with: lives with their spouse Lives in: House/apartment Stairs: Yes: External: 2 steps; on left going up Has following equipment at home: Single  point cane, Walker - 2 wheeled, shower chair, and Grab bars  PLOF: Independent- Retired from [employer]  PATIENT GOALS: Walk without any assistive device.  OBJECTIVE:  Note: Objective measures were completed at Evaluation unless otherwise noted.  DIAGNOSTIC FINDINGS: MRI BRAIN WITHOUT CONTRAST  INDICATION: Neuro deficit, acute, stroke suspected, left sided facial weakness, I63.9 Cerebral infarction, unspecified (CMS/HHS-HCC)  COMPARISON: December 02, 2023 CTA head and neck  TECHNIQUE/PROTOCOL: Standard adult protocol brain MR without contrast.  FINDINGS: Brain Parenchyma: Multifocal areas of restricted diffusion within the right cerebral hemisphere; for example, within the right periventricular white matter (series 6 image 47), right posterior temporal lobe (series 6 image 44), inferior right temporal lobe (series 6 image 41), and right parietal lobe (series 6 image 50). No midline shift. Susceptibility artifact from left frontal approach ventriculostomy shunt catheter obscures a majority of the left hemisphere and susceptibility artifact from right ICA terminus coils portions of the right temporal lobe. Encephalomalacia around the coil pack and left frontal parietal lobe, unchanged. Area of FLAIR hyperintensity in the right cerebellar peduncle, likely representing wallerian degeneration. Scattered areas of increased T2/FLAIR signal intensity in the periventricular white matter, non-specific but likely the sequela of chronic small vessel ischemic disease. Ventricles and Sulci: Stable appearance of shunted ventricular system with left frontal approach ventriculostomy catheter with tip terminating near the septum pellucidum. Extra-Axial Spaces: No extra-axial fluid collection. Basal Cisterns: Normal. Intracranial Flow-Voids: Normal.  Paranasal Sinuses: Non-opacified. Mastoids: Normal. Orbits: Normal. Cranium: Multiple burr holes. Visualized upper cervical spine: No high grade  stenosis.   IMPRESSION: 1. Multifocal acute infarcts within the right MCA territory, as above. Distribution may reflect thromboembolic etiology from the more proximal right MCA. 2. Please note that considerable susceptibility artifact from left frontal approach ventriculostomy shunt catheter obscures a majority of the left hemisphere limiting evaluation and precluding assessment for infarct in these locations.  Findings were discussed communicated via Epic secure chat with Devora MD by Lang Payment, MD at 12/03/2023 12:50 AM.  The preliminary report (critical or emergent communication) was reviewed prior to this dictation and there are no substantial differences between the preliminary results and the impressions in this final report.  Electronically Reviewed by:  Metta Fruits, MD, [facility] Electronically Reviewed on:  12/03/2023 11:11 AM  I have reviewed the images and concur with the above findings.  Electronically Signed by:  Evalene Bigger, MD, [facility] Electronically Signed on:  12/03/2023 5:30 PM Procedure Note  Amrhein, Evalene Agent, MD - 12/03/2023 Formatting of this note might be different from the original. MRI BRAIN WITHOUT CONTRAST  INDICATION: Neuro deficit, acute, stroke suspected, left sided facial weakness, I63.9 Cerebral infarction, unspecified (CMS/HHS-HCC)  COMPARISON: December 02, 2023 CTA head and neck  TECHNIQUE/PROTOCOL: Standard adult protocol brain MR without contrast.  FINDINGS: Brain Parenchyma: Multifocal areas of restricted diffusion within the right cerebral hemisphere; for example, within the right periventricular white matter (series 6 image 47), right posterior temporal lobe (series 6 image 44), inferior right temporal lobe (series 6 image 41), and right parietal lobe (series 6 image 50). No midline shift. Susceptibility artifact from left frontal approach ventriculostomy shunt catheter obscures a majority of the left hemisphere and  susceptibility artifact from right ICA terminus coils portions of the right temporal lobe. Encephalomalacia around the coil pack and left frontal parietal lobe, unchanged. Area of FLAIR hyperintensity  in the right cerebellar peduncle, likely representing wallerian degeneration. Scattered areas of increased T2/FLAIR signal intensity in the periventricular white matter, non-specific but likely the sequela of chronic small vessel ischemic disease. Ventricles and Sulci: Stable appearance of shunted ventricular system with left frontal approach ventriculostomy catheter with tip terminating near the septum pellucidum. Extra-Axial Spaces: No extra-axial fluid collection. Basal Cisterns: Normal. Intracranial Flow-Voids: Normal.  Paranasal Sinuses: Non-opacified. Mastoids: Normal. Orbits: Normal. Cranium: Multiple burr holes. Visualized upper cervical spine: No high grade stenosis.   IMPRESSION: 1. Multifocal acute infarcts within the right MCA territory, as above. Distribution may reflect thromboembolic etiology from the more proximal right MCA. 2. Please note that considerable susceptibility artifact from left frontal approach ventriculostomy shunt catheter obscures a majority of the left hemisphere limiting evaluation and precluding assessment for infarct in these locations.  Findings were discussed communicated via Epic secure chat with Devora MD by Lang Payment, MD at 12/03/2023 12:50 AM.  The preliminary report (critical or emergent communication) was reviewed prior to this dictation and there are no substantial differences between the preliminary results and the impressions in this final report.  Electronically Reviewed by:  Metta Fruits, MD, [facility] Electronically Reviewed on:  12/03/2023 11:11 AM  I have reviewed the images and concur with the above findings.  Electronically Signed by:  Evalene Bigger, MD, [facility] Electronically Signed on:  12/03/2023 5:30 PM Exam  End: 12/03/23 00:10   Specimen Collected: 12/02/23 Last Resulted: 12/03/23 17:30  Received From: [facility]  Result Received: 12/09/23 16:07     COGNITION: Overall cognitive status: Within functional limits for tasks assessed   SENSATION: WFL  COORDINATION: Impaired - delayed each LE  EDEMA:  None observed   POSTURE: rounded shoulders and forward head  LOWER EXTREMITY ROM:     Active  Right Eval Left Eval  Hip flexion    Hip extension    Hip abduction    Hip adduction    Hip internal rotation    Hip external rotation    Knee flexion    Knee extension    Ankle dorsiflexion    Ankle plantarflexion    Ankle inversion    Ankle eversion     (Blank rows = not tested)  LOWER EXTREMITY MMT:    MMT Right Eval Left Eval  Hip flexion 4 4  Hip extension    Hip abduction    Hip adduction    Hip internal rotation 2- 2-  Hip external rotation 3+ 3+  Knee flexion 3+ 3+  Knee extension 4 4  Ankle dorsiflexion 4 4  Ankle plantarflexion    Ankle inversion    Ankle eversion    (Blank rows = not tested)  BED MOBILITY:  Not tested  TRANSFERS: Sit to stand: CGA  Assistive device utilized: Environmental consultant - 2 wheeled     Stand to sit: CGA  Assistive device utilized: Environmental consultant - 2 wheeled      RAMP:  Not tested  CURB:  Not tested  STAIRS: Not tested GAIT: Findings: Gait Characteristics: step to pattern, decreased arm swing- Right, decreased arm swing- Left, decreased step length- Right, decreased step length- Left, decreased stance time- Right, decreased stance time- Left, decreased stride length, decreased hip/knee flexion- Right, decreased hip/knee flexion- Left, decreased ankle dorsiflexion- Right, decreased ankle dorsiflexion- Left, wide BOS, poor foot clearance- Right, and poor foot clearance- Left, Distance walked: 60 feet, Assistive device utilized:Walker - 2 wheeled, Level of assistance: CGA, and Comments:  FUNCTIONAL TESTS:  5 times sit to stand:  25.20 sec with LUE Support Timed up and go (TUG): 40 sec with RW (increased time to place right hand on walker)  6 minute walk test: 482' 10 meter walk test: 0.65 m/s Berg Balance Scale: 42/56    PATIENT SURVEYS:  Stroke Impact Scale - 65.63%                                                                                                                             TREATMENT DATE: 01/21/24  Patient arrives to session in transport chair.  Stand pivot transfer to arm chair without UE support and no assist from PT.   Gait with SPC 330ft x 3 bouts with 1-2 min rest between bouts due to mild knee pain and SOB.  No overt LOB, but pt reports feeling unsteady and lopsided    Standing on airex pad while engaged in cognitive task to solve letter puzzle. Cues for visual scanning to the R to locate all letters. Pt required 5 min standing on pad to complete puzzle.   Gait over red mat with SPC x 3 laps + 3laps and without UE support 2 laps x 2.  Throughout session, PT provided CGA for safety with gait belt in place.         PATIENT EDUCATION: Education details: PT plan of care; Discussion of symptoms of CVA and to go to ED as soon as possible. Purpose of PT for balance, strength, coordination and endurance.  Person educated: Patient Education method: Explanation Education comprehension: verbalized understanding  HOME EXERCISE PROGRAM: Access Code: [email or web address] URL: [email or web address] Date: 12/17/2023 Prepared by: Sidra Simpers  Program Notes **Be sure to perform all exercises next to a countertop or sturdy piece of furniture in case you become unsteady.  Exercises - Standing Heel Raise with Support  - 1 x daily - 7 x weekly - 2 sets - 15 reps - Standing Toe Raises at Chair  - 1 x daily - 7 x weekly - 2 sets - 15 reps - Mini Squat with Counter Support  - 1 x daily - 7 x weekly - 2 sets - 15 reps - Lunge with Counter Support  - 1 x daily - 7 x weekly - 2 sets - 15 reps -  Standing Tandem Balance with Counter Support  - 1 x daily - 7 x weekly - 2 sets - 2 reps - 30 hold - Standing Single Leg Stance with Counter Support  - 1 x daily - 7 x weekly - 2 sets - 2 reps - 30 hold  GOALS: Goals reviewed with patient? Yes  SHORT TERM GOALS: Target date: 01/21/2024  Pt will be independent with HEP in order to improve strength and balance in order to decrease fall risk and improve function at home.  Baseline: EVAL - No current formal HEP in place Goal status: INITIAL   LONG TERM GOALS: Target date: 03/03/2024  1.  Patient will complete five times sit to stand test in < 15 seconds indicating an increased LE strength and improved balance. Baseline: EVAL= 25.20 sec with LUE Support 9/2: 23.36 without UE support and CGA-min assist. 20.10 sec with LUE support Goal status: INITIAL  2.  Patient will increase Berg Balance score by > 6 points to demonstrate decreased fall risk during functional activities. Baseline: EVAL: To be assessed next visit; 12/12/2023= 42/56 9/2: 44/56  Goal status: INITIAL   3.  Patient will reduce timed up and go to <11 seconds to reduce fall risk and demonstrate improved transfer/gait ability. Baseline: EVAL = 40 sec with RW 9/2: 18.33sec with SPC Goal status: INITIAL  4.  Patient will increase 10 meter walk test to >1.63m/s as to improve gait speed for better community ambulation and to reduce fall risk. Baseline: EVAL: 0.65 m/s 9/2: 0.67 m/s with [facility] 9/9: 0.71 m/s without an AD and wearing 2.5 # AW Goal status: INITIAL  5. Patient will increase six minute walk test distance by 200 feet for progression to community ambulator and improve gait ability Baseline: EVAL= 482' 9/2: 465 feet  using a SPC with CGA required seated rest break at 4:38min  Goal status: INITIAL   ASSESSMENT:  CLINICAL IMPRESSION: Treatment continues to focus on activity tolerence and functional mobility on level and unlevel surfaces. She continues to demo more  confidence but self limiting due to increased anxiety with fatigue. No loss LOB noted while performing balance/cognitive tasks or gait on unlevel surface, but pt continues to report severe fear of falling and mild knee pain. Pt will continue to benefit from skilled therapy to address remaining deficits in order to improve overall QoL and return to PLOF.     OBJECTIVE IMPAIRMENTS: Abnormal gait, decreased activity tolerance, decreased balance, decreased coordination, decreased endurance, decreased mobility, difficulty walking, decreased ROM, decreased strength, impaired UE functional use, and pain.   ACTIVITY LIMITATIONS: carrying, lifting, bending, sitting, standing, squatting, sleeping, stairs, and transfers  PARTICIPATION LIMITATIONS: meal prep, cleaning, laundry, driving, shopping, community activity, and yard work  PERSONAL FACTORS: 1-2 comorbidities: OA, previous CVA with significant R sided weakness are also affecting patient's functional outcome.   REHAB POTENTIAL: Good  CLINICAL DECISION MAKING: Stable/uncomplicated  EVALUATION COMPLEXITY: Moderate  PLAN:  PT FREQUENCY: 1-2x/week  PT DURATION: 12 weeks  PLANNED INTERVENTIONS: 97164- PT Re-evaluation, 97750- Physical Performance Testing, 97110-Therapeutic exercises, 97530- Therapeutic activity, [id number]- Neuromuscular re-education, 97535- Self Care, [id number]- Manual therapy, [id number]- Gait training, [phone]- Orthotic Initial, [phone]- Orthotic/Prosthetic subsequent, [phone]- Canalith repositioning, [phone]- Electrical stimulation (manual), [phone] (1-2 muscles), [id number] (3+ muscles)- Dry Needling, Patient/Family education, Balance training, Stair training, Taping, Joint mobilization, Joint manipulation, Spinal manipulation, Spinal mobilization, Compression bandaging, Vestibular training, DME instructions, Cryotherapy, and Moist heat  PLAN FOR NEXT SESSION:   Continue  Curb step navigation training  B LE functional strengthening Sit<>stands Step-ups   Gait training with LRAD with increased activity tolerance.    Massie Dollar PT, DPT  Physical Therapist - [place]  [facility]  9:33 AM 01/27/24

## 2024-01-29 ENCOUNTER — Ambulatory Visit

## 2024-01-29 ENCOUNTER — Ambulatory Visit: Admitting: Occupational Therapy

## 2024-01-29 ENCOUNTER — Ambulatory Visit: Admitting: Speech Pathology

## 2024-01-29 DIAGNOSIS — R262 Difficulty in walking, not elsewhere classified: Secondary | ICD-10-CM

## 2024-01-29 DIAGNOSIS — R296 Repeated falls: Secondary | ICD-10-CM

## 2024-01-29 DIAGNOSIS — M6281 Muscle weakness (generalized): Secondary | ICD-10-CM

## 2024-01-29 DIAGNOSIS — R269 Unspecified abnormalities of gait and mobility: Secondary | ICD-10-CM

## 2024-01-29 DIAGNOSIS — R278 Other lack of coordination: Secondary | ICD-10-CM

## 2024-01-29 NOTE — Therapy (Signed)
 OUTPATIENT PHYSICAL THERAPY TREATMENT   Patient Name: Erika Stanton MRN: [id number] DOB:[birth year], 68 y.o., female Today's Date: 01/29/2024  PCP: Dr. Oneil Galloway  REFERRING PROVIDER: Dr. Oneil Galloway  END OF SESSION:  PT End of Session - 01/29/24 1319     Visit Number 14    Number of Visits 24    Date for PT Re-Evaluation 03/03/24    PT Start Time 1317    PT Stop Time 1357    PT Time Calculation (min) 40 min    Equipment Utilized During Treatment Gait belt    Activity Tolerance Patient tolerated treatment well;No increased pain    Behavior During Therapy WFL for tasks assessed/performed           Past Medical History:  Diagnosis Date   Anatomical narrow angle, bilateral    Aneurysm (HCC)    Asthma    Cerebral aneurysm    Cerebral vasospasm    Cognitive change    Dysphagia, post-stroke    GERD (gastroesophageal reflux disease)    Hemorrhoids    History of cervical dysplasia    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 no recurrent polyps.  suggest repeat surveillance study 11/2022.     COLONOSCOPY W/ POLYPECTOMY  06/2014   Dr Elder at [facility].  3 adenomatoous polyps, anal fissure.     ESOPHAGOGASTRODUODENOSCOPY (EGD) WITH PROPOFOL  N/A 01/27/2020   Procedure: ESOPHAGOGASTRODUODENOSCOPY (EGD) WITH PROPOFOL ;  Surgeon: Shila Gustav GAILS, MD;  Location: [facility] ENDOSCOPY;  Service: Endoscopy;  Laterality: N/A;   INSERTION OF PERMANENT INTRAPERITONEAL CANNULA/CATH N/A    LAPAROSCOPIC   IR 3D INDEPENDENT WKST  01/06/2020   IR ANGIO INTRA EXTRACRAN  SEL INTERNAL CAROTID BILAT MOD SED  01/06/2020   IR ANGIO VERTEBRAL SEL VERTEBRAL UNI L MOD SED  01/06/2020   IR ANGIOGRAM FOLLOW UP STUDY  01/06/2020   IR ANGIOGRAM FOLLOW UP STUDY  01/06/2020   IR ANGIOGRAM FOLLOW UP STUDY  01/06/2020   IR ANGIOGRAM FOLLOW UP STUDY  01/06/2020   IR ANGIOGRAM FOLLOW UP STUDY  01/06/2020   IR ANGIOGRAM FOLLOW UP STUDY  01/06/2020   IR ANGIOGRAM FOLLOW UP STUDY  01/06/2020   IR ANGIOGRAM FOLLOW UP STUDY  01/06/2020   IR ANGIOGRAM FOLLOW UP STUDY  01/06/2020   IR ANGIOGRAM FOLLOW UP STUDY  01/06/2020   IR NEURO EACH ADD'L AFTER BASIC UNI RIGHT (MS)  01/06/2020   IR TRANSCATH/EMBOLIZ  01/06/2020   MASTECTOMY     MOHS SURGERY N/A    RADIOLOGY WITH ANESTHESIA N/A 01/06/2020   Procedure: IR WITH ANESTHESIA FOR ANEURYSM;  Surgeon: Lanis Pupa, MD;  Location: [facility] OR;  Service: Radiology;  Laterality: N/A;   STENT SUPPORTED EMBOLIZATION OF RIGHT ICA ANEURYSM Right    TUBAL LIGATION     VENTRICULO-PERITONEAL SHUNT PLACEMENT / LAPAROSCOPIC INSERTION PERITONEAL CATHETER N/A    Patient Active Problem List   Diagnosis Date Noted   Dyspareunia in female 04/05/2020   History of abnormal cervical  Pap smear 03/10/2020   GIB (gastrointestinal bleeding) 02/28/2020   Elevated BUN    Prediabetes    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   Cerebral vasospasm    Sinus tachycardia    Dysphagia, post-stroke    Thrombocytopenia (HCC)    Acute blood loss anemia    Brain aneurysm    ICH (intracerebral hemorrhage) (HCC) 01/05/2020   History of adenomatous polyp of colon 02/07/2016   Anatomical narrow angle, bilateral 12/14/2014   Fissure in ano 11/11/2014   Hemorrhoids 11/11/2014   Constipation 11/19/2013   GERD (gastroesophageal reflux disease) 03/24/2013   Irritable bowel syndrome with diarrhea 03/24/2013   Herpes zoster 05/14/2005   Abnormal Pap smear of cervix 05/15/1983   History of cervical dysplasia 05/15/1983    ONSET DATE: 12/02/2023  REFERRING  DIAG: I63.9 (ICD-10-CM) - CVA (cerebral vascular accident) (HCC)   THERAPY DIAG:  Muscle weakness (generalized)  Other lack of coordination  Abnormality of gait and mobility  Difficulty in walking, not elsewhere classified  Repeated falls  Rationale for Evaluation and Treatment: Rehabilitation  SUBJECTIVE:                                                                                                                                                                                             SUBJECTIVE STATEMENT:   Pt reports doing okay. Reports that she did not do much of the weekend. Mild pain in bil knees. Rates 4/10 on this day.   From EVAL:Patient reports she was in hospital from [facility]- Sat (July 21st- 25th) with CVA affecting left side. I was supposed to have a knee replacement surgery in September but that is on hold for now. I was doing my prep for my colonoscopy and fell in the bathroom.  Patient reports having mild left sided weakness compared to her CVA in 2021 with heavy Right sided UE weakness. She reports bad OA affecting both knees as well.    Pt accompanied by: Husband brought patient  PERTINENT HISTORY: Previous CVA in 2021 affecting Right side. Reports going to ED  last Monday due to fall/CVA affecting Left side. Diagnosed with R CVA with Left Sided weakness.    PAIN:  Are you having pain? Yes: NPRS scale: Current = 1/10; worst= 6/10 Pain location: Bilateral knees- Arthritis  Pain description: achy yet sharp with weight bearing Aggravating factors: Increased walking, transfers Relieving factors: rest  PRECAUTIONS: Fall  RED FLAGS: None   WEIGHT BEARING RESTRICTIONS: No  FALLS: Has patient fallen in last 6 months? Yes. Number of falls fell with the stroke  LIVING ENVIRONMENT: Lives with: lives with their spouse Lives  in: House/apartment Stairs: Yes: External: 2 steps; on left going up Has following equipment at home: Single point cane, Walker - 2  wheeled, shower chair, and Grab bars  PLOF: Independent- Retired from [employer]  PATIENT GOALS: Walk without any assistive device.  OBJECTIVE:  Note: Objective measures were completed at evaluation unless otherwise noted.  LOWER EXTREMITY MMT:    MMT Right Eval Left Eval  Hip flexion 4 4  Hip internal rotation 2- 2-  Hip external rotation 3+ 3+  Knee flexion 3+ 3+  Knee extension 4 4  Ankle dorsiflexion 4 4  (Blank rows = not tested)  TRANSFERS: Sit to stand: CGA  Assistive device utilized: Environmental consultant - 2 wheeled     Stand to sit: CGA  Assistive device utilized: Environmental consultant - 2 wheeled      GAIT: Findings: Gait Characteristics: step to pattern, decreased arm swing- Right, decreased arm swing- Left, decreased step length- Right, decreased step length- Left, decreased stance time- Right, decreased stance time- Left, decreased stride length, decreased hip/knee flexion- Right, decreased hip/knee flexion- Left, decreased ankle dorsiflexion- Right, decreased ankle dorsiflexion- Left, wide BOS, poor foot clearance- Right, and poor foot clearance- Left, Distance walked: 60 feet, Assistive device utilized:Walker - 2 wheeled, Level of assistance: CGA, and Comments:    FUNCTIONAL TESTS:  5 times sit to stand: 25.20 sec with LUE Support Timed up and go (TUG): 40 sec with RW (increased time to place right hand on walker)  6 minute walk test: 482' 10 meter walk test: 0.65 m/s Berg Balance Scale: 42/56    PATIENT SURVEYS:  Stroke Impact Scale - 65.63%                                                                                                                             TREATMENT DATE 01/29/24 :   -Patient arrives to session in transport chair -seated heel slides x20 for knee OA warmup  -8xSTS from chair + airex boost  -overground AMB c SPC LUE (used sparingly per preference) 353ft 3:19 DOE at 18ft, standing rest break, feelings of knee buckling at 265ft.    -151/83 mmHg 96% SpO2, 98 bpm- post  AMB vitals (seated) -141/93 mmHg  101bpm standing (standing x0) -137/93 mmHg  99bpm (Standing x1 minute)  -137//97 mmHg 93bpm (Standing x2 minute)  -STS from chair + airex x10 with hand push off   -AMB overground x330ft, brief DOE break at 139ft, tries to complete with mask pulled down to improve her  -154/106 mmHg post AMB vitals in standing, 111 bpm   -foursquare stepping on Red mat: 3 laps, 4 directions, minGuardA   PATIENT EDUCATION: Education details: PT plan of care; Discussion of symptoms of CVA and to go to ED as soon as possible. Purpose of PT for balance, strength, coordination and endurance.  Person educated: Patient Education method: Explanation Education comprehension: verbalized understanding  HOME EXERCISE PROGRAM: Access Code: [email or web address] URL: [email or web address] Date: 12/17/2023 Prepared by: Sidra  Robbins  Program Notes **Be sure to perform all exercises next to a countertop or sturdy piece of furniture in case you become unsteady.  Exercises - Standing Heel Raise with Support  - 1 x daily - 7 x weekly - 2 sets - 15 reps - Standing Toe Raises at Chair  - 1 x daily - 7 x weekly - 2 sets - 15 reps - Mini Squat with Counter Support  - 1 x daily - 7 x weekly - 2 sets - 15 reps - Lunge with Counter Support  - 1 x daily - 7 x weekly - 2 sets - 15 reps - Standing Tandem Balance with Counter Support  - 1 x daily - 7 x weekly - 2 sets - 2 reps - 30 hold - Standing Single Leg Stance with Counter Support  - 1 x daily - 7 x weekly - 2 sets - 2 reps - 30 hold  GOALS: Goals reviewed with patient? Yes  SHORT TERM GOALS: Target date: 01/21/2024  Pt will be independent with HEP in order to improve strength and balance in order to decrease fall risk and improve function at home.  Baseline: EVAL - No current formal HEP in place Goal status: INITIAL   LONG TERM GOALS: Target date: 03/03/2024  1.  Patient will complete five times sit to stand test in < 15 seconds  indicating an increased LE strength and improved balance. Baseline: EVAL= 25.20 sec with LUE Support 9/2: 23.36 without UE support and CGA-min assist. 20.10 sec with LUE support Goal status: INITIAL  2.  Patient will increase Berg Balance score by > 6 points to demonstrate decreased fall risk during functional activities. Baseline: EVAL: To be assessed next visit; 12/12/2023= 42/56 9/2: 44/56  Goal status: INITIAL   3.  Patient will reduce timed up and go to <11 seconds to reduce fall risk and demonstrate improved transfer/gait ability. Baseline: EVAL = 40 sec with RW 9/2: 18.33sec with SPC Goal status: INITIAL  4.  Patient will increase 10 meter walk test to >1.5m/s as to improve gait speed for better community ambulation and to reduce fall risk. Baseline: EVAL: 0.65 m/s 9/2: 0.67 m/s with [facility] 9/9: 0.71 m/s without an AD and wearing 2.5 # AW Goal status: INITIAL  5. Patient will increase six minute walk test distance by 200 feet for progression to community ambulator and improve gait ability Baseline: EVAL= 482' 9/2: 465 feet  using a SPC with CGA required seated rest break at 4:48min  Goal status: INITIAL   ASSESSMENT:  CLINICAL IMPRESSION: Continued with overground activity. Confidence in balance remains limited at times. Orthostatic and post exercise vitals reassuring. Knee pain remains a limitation overall. Pt will continue to benefit from skilled therapy to address remaining deficits in order to improve overall QoL and return to PLOF.     OBJECTIVE IMPAIRMENTS: Abnormal gait, decreased activity tolerance, decreased balance, decreased coordination, decreased endurance, decreased mobility, difficulty walking, decreased ROM, decreased strength, impaired UE functional use, and pain.   ACTIVITY LIMITATIONS: carrying, lifting, bending, sitting, standing, squatting, sleeping, stairs, and transfers  PARTICIPATION LIMITATIONS: meal prep, cleaning, laundry, driving, shopping, community  activity, and yard work  PERSONAL FACTORS: 1-2 comorbidities: OA, previous CVA with significant R sided weakness are also affecting patient's functional outcome.   REHAB POTENTIAL: Good  CLINICAL DECISION MAKING: Stable/uncomplicated  EVALUATION COMPLEXITY: Moderate  PLAN:  PT FREQUENCY: 1-2x/week  PT DURATION: 12 weeks  PLANNED INTERVENTIONS: 97164- PT Re-evaluation, 97750- Physical Performance Testing, 97110-Therapeutic  exercises, 97530- Therapeutic activity, [id number]- Neuromuscular re-education, [phone]- Self Care, 02859- Manual therapy, [id number]- Gait training, [phone]- Orthotic Initial, [phone]- Orthotic/Prosthetic subsequent, [phone]- Canalith repositioning, [phone]- Electrical stimulation (manual), [phone] (1-2 muscles), [id number] (3+ muscles)- Dry Needling, Patient/Family education, Balance training, Stair training, Taping, Joint mobilization, Joint manipulation, Spinal manipulation, Spinal mobilization, Compression bandaging, Vestibular training, DME instructions, Cryotherapy, and Moist heat  PLAN FOR NEXT SESSION:  Continue  Curb step navigation training  B LE functional strengthening Sit<>stands Step-ups  Gait training with LRAD with increased activity tolerance.   1:23 PM, 01/29/24 Peggye JAYSON Linear, PT, DPT Physical Therapist - [facility]  Outpatient Physical Therapy- Main Campus [phone]

## 2024-01-30 NOTE — Therapy (Addendum)
 OUTPATIENT OCCUPATIONAL THERAPY NEURO TREATMENT NOTE   Patient Name: Erika Stanton MRN: [id number] DOB:Jun 08, 1955, 68 y.o., female Today's Date: 01/30/2024  PCP: Laurice Anes, MD REFERRING PROVIDER: Laurice Anes, MD   OT End of Session - 01/30/24 0809     Visit Number 13    Number of Visits 24    Date for OT Re-Evaluation 03/03/24    OT Start Time 1015    OT Stop Time 1100    OT Time Calculation (min) 45 min    Equipment Utilized During Treatment transport chair    Activity Tolerance Patient tolerated treatment well    Behavior During Therapy WFL for tasks assessed/performed         Past Medical History:  Diagnosis Date   Anatomical narrow angle, bilateral    Aneurysm (HCC)    Asthma    Cerebral aneurysm    Cerebral vasospasm    Cognitive change    Dysphagia, post-stroke    GERD (gastroesophageal reflux disease)    Hemorrhoids    History of cervical dysplasia    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 no recurrent polyps.  suggest repeat surveillance study 11/2022.     COLONOSCOPY W/ POLYPECTOMY  06/2014   Dr Elder at [facility].  3 adenomatoous polyps, anal fissure.     ESOPHAGOGASTRODUODENOSCOPY (EGD) WITH PROPOFOL  N/A 01/27/2020   Procedure: ESOPHAGOGASTRODUODENOSCOPY (EGD) WITH PROPOFOL ;  Surgeon: Shila Gustav GAILS, MD;  Location: [facility] ENDOSCOPY;  Service: Endoscopy;  Laterality: N/A;   INSERTION OF PERMANENT INTRAPERITONEAL CANNULA/CATH N/A    LAPAROSCOPIC   IR 3D INDEPENDENT WKST  01/06/2020   IR ANGIO INTRA EXTRACRAN SEL INTERNAL  CAROTID BILAT MOD SED  01/06/2020   IR ANGIO VERTEBRAL SEL VERTEBRAL UNI L MOD SED  01/06/2020   IR ANGIOGRAM FOLLOW UP STUDY  01/06/2020   IR ANGIOGRAM FOLLOW UP STUDY  01/06/2020   IR ANGIOGRAM FOLLOW UP STUDY  01/06/2020   IR ANGIOGRAM FOLLOW UP STUDY  01/06/2020   IR ANGIOGRAM FOLLOW UP STUDY  01/06/2020   IR ANGIOGRAM FOLLOW UP STUDY  01/06/2020   IR ANGIOGRAM FOLLOW UP STUDY  01/06/2020   IR ANGIOGRAM FOLLOW UP STUDY  01/06/2020   IR ANGIOGRAM FOLLOW UP STUDY  01/06/2020   IR ANGIOGRAM FOLLOW UP STUDY  01/06/2020   IR NEURO EACH ADD'L AFTER BASIC UNI RIGHT (MS)  01/06/2020   IR TRANSCATH/EMBOLIZ  01/06/2020   MASTECTOMY     MOHS SURGERY N/A    RADIOLOGY WITH ANESTHESIA N/A 01/06/2020   Procedure: IR WITH ANESTHESIA FOR ANEURYSM;  Surgeon: Lanis Pupa, MD;  Location: [facility] OR;  Service: Radiology;  Laterality: N/A;   STENT SUPPORTED EMBOLIZATION OF RIGHT ICA ANEURYSM Right    TUBAL LIGATION     VENTRICULO-PERITONEAL SHUNT PLACEMENT / LAPAROSCOPIC INSERTION PERITONEAL CATHETER N/A    Patient Active Problem List   Diagnosis Date Noted   Dyspareunia in female 04/05/2020   History of abnormal cervical Pap smear 03/10/2020  GIB (gastrointestinal bleeding) 02/28/2020   Elevated BUN    Prediabetes    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   Cerebral vasospasm    Sinus tachycardia    Dysphagia, post-stroke    Thrombocytopenia (HCC)    Acute blood loss anemia    Brain aneurysm    ICH (intracerebral hemorrhage) (HCC) 01/05/2020   History of adenomatous polyp of colon 02/07/2016   Anatomical narrow angle, bilateral 12/14/2014   Fissure in ano 11/11/2014   Hemorrhoids 11/11/2014   Constipation 11/19/2013   GERD (gastroesophageal reflux disease) 03/24/2013   Irritable bowel syndrome with diarrhea 03/24/2013   Herpes zoster 05/14/2005   Abnormal Pap smear of cervix 05/15/1983   History of cervical dysplasia 05/15/1983   ONSET DATE: 12/02/2023  REFERRING DIAG: L  ischemic CVA  THERAPY DIAG:  Muscle weakness (generalized)  Other lack of coordination  Rationale for Evaluation and Treatment: Rehabilitation  SUBJECTIVE:  SUBJECTIVE STATEMENT: Pt reports that  she was told she doesn't need speech therapy, but was hoping they would help with her confidence when walking. Pt accompanied by: self  PERTINENT HISTORY:  Pt. Was admitted to [facility] from 12/02/23-12/06/23 with an Acute Right MCA CVA, Hydrocephalus with shunt  placement. History of Subarachnoid Hemorrhage with bilateral ICA coiled aneurysms. 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  PRECAUTIONS: None  WEIGHT BEARING RESTRICTIONS: No  PAIN:  01/29/24: No reports of pain  01/27/24: 4/10 bilat knees 01/23/24: no reports of pain 01/16/24: 4/10 bilat knees 01/14/24: No reports of pain 01/09/24: 4/10 pain bilat knees  Are you having pain? No  FALLS: Has patient fallen in last 6 months? Yes. Number of falls 1  LIVING ENVIRONMENT: Lives with: lives with their family and lives with their spouse Lives in: House/apartment Stairs: hand rails, 2 steps   Has following equipment at home: [name], cane  PLOF: Independent with basic ADLs, Spouse help as needed  PATIENT GOALS: Strengthening  OBJECTIVE:  Note: Objective measures were completed at Evaluation unless otherwise noted.  HAND DOMINANCE: Left  ADLs: Overall ADLs: spouse assists when needed, Pt. reports that she can do most tasks herself. Uses her left hand to engage in self-care tasks. Eating: Able to cut foods with a fork, husband assists as needed.  Grooming: husband assists with applying toothpaste, able to brush teeth and hair UB Dressing: Spouse assists with  applying/fastening bra, Pt. Requires assistance with zippers, and buttons. LB Dressing: Spouse assists with tying shoes when needed, uses slip on shoes. Pt. Does not zip pants. Toileting: Able to complete independently Bathing: Pt. Requires intermittent assistance with bathing tasks. Tub Shower transfers: Spouse assists with transfers when/if needed Equipment: Shower seat with back  IADLs: Shopping: Spouse typically does the shopping, isnt able to carry heavy objects.  Light housekeeping: Spouse typically does most of the cleaning around the home, Pt. Cleans bathroom, and laundry Pt. Is unable to carry laundry due to exacerbation of weakness.  Meal Prep: Spouse typically cooks and prepares meals.  Community mobility: Pt. Requires assistance using walker. Pt. reports that she is unable to carry heavy items, and typically does not do the shopping.  Medication management: Pt. is able to take care of meds with pillbox Financial management:  Spouse takes care of it, and makes online payments.  Handwriting: 50% legible  MOBILITY STATUS: Needs Assist: Pt. Requires use of a walker   POSTURE COMMENTS:  No Significant postural limitations Sitting balance: Good  ACTIVITY TOLERANCE: Activity tolerance:   FUNCTIONAL OUTCOME MEASURES: TBD  UPPER EXTREMITY ROM:    Active ROM Right eval Right 01/16/24 Left eval Left 01/16/24  Shoulder flexion 94(121) 100 with scaption (132) 102(125) 134 with scaption (145)  Shoulder abduction 75(106) 74 (98) 110(126) 140 (170)  Shoulder adduction      Shoulder extension      Shoulder internal rotation      Shoulder external rotation      Elbow flexion 135(155)  140(150)   Elbow extension 0  -10(-9)   Wrist flexion 40(44)  42(58)   Wrist extension 30(34)  40(50)   Wrist ulnar deviation      Wrist radial deviation      Wrist pronation      Wrist supination      (Blank rows = not tested)  UPPER EXTREMITY MMT:     MMT Right eval Left eval  Shoulder  flexion 3-/5 3-/5  Shoulder abduction 3-/5 3-/5  Shoulder adduction    Shoulder extension    Shoulder internal rotation    Shoulder external rotation    Middle trapezius    Lower trapezius    Elbow flexion 4-/5 4+/5  Elbow extension 4-/5 4+/5  Wrist flexion 4-/5 3+/5  Wrist extension 4-/5 3+/5  Wrist ulnar deviation    Wrist radial deviation    Wrist pronation    Wrist supination    (Blank rows = not tested)  HAND FUNCTION: Grip strength: Right: N/T lbs; Left: 43 lbs, Lateral pinch: Right: N/T lbs, Left: 9 lbs, and 3 point pinch: Right: N/T lbs, Left: 5 lbs Pt. Presents with flexor tightness in the right 5th digit, however is able to passively extend 5th digit within normal range. Pt. Also Presents with hyper extension in the 2nd and 3rd digit at PIP joints. Pt. Is able to achieve full Digit MP, PIP, and DIP PROM.  01/16/24: Grip strength: Left: 33 lbs; Lateral pinching: Left: 8 lbs; 3 point pinch: Left: 3 lbs  COORDINATION: 9 Hole Peg test: Right: N/T sec; Left: 51 sec 01/16/24: Left: 1 min 1 sec  SENSATION: Not tested  EDEMA:   MUSCLE TONE: Fluctuating flexor tone in R hand.  COGNITION: Overall cognitive status: Within functional limits for tasks assessed  VISION: Subjective report:  Baseline vision:  Visual history:   VISION ASSESSMENT:  PERCEPTION:   PRAXIS:   OBSERVATIONS: Pt. presents with fluctuating flexor tone in the R hand, involving the 5th digit at the PIP/DIP joint. Pt. Presents with hyperextension at the 2nd and 3rd digits at the PIP joints.                                                                                                                    TREATMENT DATE: 01/27/24  Therapeutic Ex.:    -Pt. tolerated right hand PROM/AAROM through all joint  ranges.    Therapeutic Activities:    -facilitated left hand Lateral, and 3pt. Pinch strengthening using yellow, red, green, and blue level resistive clips in combination with bilateral motor control  skills transferring them between her hands. Pt grasped the clips with the right hand transferred them to the left hand for pinch strengthening. After transferring them back to the right hand, she reached out in front of her, then to the far right to discard them into a container placed at the tabletop. Pt. Continues to require step-by-step cues, and cues for hand clip position, and for each step of the task.   Self-care:   Pt. worked on Mining engineer with the left hand to formulate her first name with 75% legibility, and a list of words with 50% legibility using a pen with an attached pen grip.   PATIENT EDUCATION: Education details:  left hand function, bimanual dexterity, writing skills Person educated: Patient Education method: Explanation and Verbal cues Education comprehension: verbalized understanding and needs further education  HOME EXERCISE PROGRAM: -Pink theraputty; visual handout issued -Self and caregiver assisted PROM to R hand and wrist  GOALS: Goals reviewed with patient? Yes  SHORT TERM GOALS: Target date: 01/21/2024    Pt. Will be independent with HEP for UE functioning. Baseline: Eval:No current HEP; 01/16/24: pt reports doing a little writing and squeezing a ball, but denies using putty; spouse stretches RUE daily Goal status: in progress  LONG TERM GOALS: Target date: 03/03/2024    Pt. Will improve [facility] skills to be able to be able to manipulate small objects at home Baseline: 9 hole peg test: L: 51 seconds; 01/16/24: L 1 min 1 sec Goal status: in progress  2.  Pt. Will increase BUE shoulder ROM by 10 degrees to be able to reach into cabinets and shelves.  Baseline: Shoulder flexion: R: 94(121), L: 102(125), Shoulder abduction: R: 75(106), L: 110(126); 01/16/24: Shoulder flexion: R: 100 with scaption (132), L: 134 with scaption (145), Shoulder abduction: R: 74 (98), L: 140 (170) Goal status: in progress  3.  Pt. Will improve L grip strength by 5# to be able to  securely grasp ADL items at home.  Baseline: Grip strength: R: NT, L: 43#; 01/16/24: L: 33# Goal status: in progress  4.  Pt. Will increase L lateral key pinch strength by 3#  to open wide mouth jars.  Baseline: Lateral key pinch: R: NT, L: 9#; 01/16/24: L: 8# Goal status: in progress  5.  Pt. Will independently engage the right hand as a gross assist to the left hand 100% of the time during ADLs/IADLs. Baseline: Eval: Pt. With limited engagement of the right hand as a gross assist during tasks; 01/16/24: Observed <25% of the time during OT sessions  Goal status: in progress  6.  Pt. will improve left hand [facility] skills by 3 sec. of speed to be able to manipulate zippers, and buttons efficiently. Baseline: Eval:Right: NT Left: 51 sec; 01/16/24: Left: 1 min and 1 sec  Goal status: in progress  7. Pt. Will improve handwriting legibility to 100% in printed form for written correspondence.  Baseline: 50% legible in printed form for first name only; 01/16/24: 80-90% legible to print first name 1 of 4 attempts.  3 of 4 attempts 50% or less legible Goal status: in progress  ASSESSMENT: CLINICAL IMPRESSION:  Pt. was able to formulate her first name with 75% legibility, followed by a list of words with 75% legibility. Pt. Requires increased time, and assist  to position the pen grip in her hand. Pt. Required visual cues pinch position on the clips, and occasional assist to reach to the far right with the RUE.  Pt. continues to benefit from OT services to increase BUE ROM in the shoulder, wrist and hands, as well as increasing L grip/pinch strength and improve [facility] skills to be able to perform her desired ADL/IADLs.   PERFORMANCE DEFICITS: in functional skills including ADLs, IADLs, coordination, dexterity, edema, tone, ROM, strength, pain, Fine motor control, Gross motor control, endurance, and UE functional use, and psychosocial skills including coping strategies, environmental adaptation, habits, and routines and  behaviors.   IMPAIRMENTS: are limiting patient from ADLs, IADLs, rest and sleep, leisure, and social participation.   CO-MORBIDITIES: may have co-morbidities  that affects occupational performance. Patient will benefit from skilled OT to address above impairments and improve overall function.  MODIFICATION OR ASSISTANCE TO COMPLETE EVALUATION: Min-Moderate modification of tasks or assist with assess necessary to complete an evaluation.  OT OCCUPATIONAL PROFILE AND HISTORY: Detailed assessment: Review of records and additional review of physical, cognitive, psychosocial history related to current functional performance.  CLINICAL DECISION MAKING: Moderate - several treatment options, min-mod task modification necessary  REHAB POTENTIAL: Good  EVALUATION COMPLEXITY: Moderate    PLAN:  OT FREQUENCY: 2x/week  OT DURATION: 12 weeks  PLANNED INTERVENTIONS: 97168 OT Re-evaluation, 97535 self care/ADL training, 02889 therapeutic exercise, 97530 therapeutic activity, 97112 neuromuscular re-education, 97140 manual therapy, 97018 paraffin, 02960 fluidotherapy, 97010 moist heat, 97010 cryotherapy, 97034 contrast bath, 97032 electrical stimulation (manual), 97760 Orthotic Initial, passive range of motion, energy conservation, coping strategies training, patient/family education, and DME and/or AE instructions  RECOMMENDED OTHER SERVICES: PT   CONSULTED AND AGREED WITH PLAN OF CARE: Patient  PLAN FOR NEXT SESSION: Treatment  Andi Layfield, MS, OTR/L   01/30/2024, 8:17 AM

## 2024-02-03 ENCOUNTER — Encounter: Admitting: Speech Pathology

## 2024-02-03 ENCOUNTER — Ambulatory Visit

## 2024-02-03 ENCOUNTER — Ambulatory Visit: Admitting: Occupational Therapy

## 2024-02-03 DIAGNOSIS — M6281 Muscle weakness (generalized): Secondary | ICD-10-CM

## 2024-02-03 DIAGNOSIS — R262 Difficulty in walking, not elsewhere classified: Secondary | ICD-10-CM

## 2024-02-03 DIAGNOSIS — R269 Unspecified abnormalities of gait and mobility: Secondary | ICD-10-CM

## 2024-02-03 DIAGNOSIS — R278 Other lack of coordination: Secondary | ICD-10-CM

## 2024-02-03 NOTE — Therapy (Signed)
 OUTPATIENT PHYSICAL THERAPY TREATMENT   Patient Name: Erika Stanton MRN: [id number] DOB:20-Jul-1955, 68 y.o., female Today's Date: 02/03/2024  PCP: Dr. Oneil Galloway  REFERRING PROVIDER: Dr. Oneil Galloway  END OF SESSION:  PT End of Session - 02/03/24 1413     Visit Number 15    Number of Visits 24    Date for Recertification  03/03/24    Authorization Type [employer]    Progress Note Due on Visit 20    PT Start Time 1410    PT Stop Time 1440    PT Time Calculation (min) 30 min    Equipment Utilized During Treatment Gait belt    Activity Tolerance Patient tolerated treatment well;No increased pain    Behavior During Therapy WFL for tasks assessed/performed           Past Medical History:  Diagnosis Date   Anatomical narrow angle, bilateral    Aneurysm    Asthma    Cerebral aneurysm    Cerebral vasospasm    Cognitive change    Dysphagia, post-stroke    GERD (gastroesophageal reflux disease)    Hemorrhoids    History of cervical dysplasia    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 no recurrent polyps.  suggest repeat surveillance study 11/2022.     COLONOSCOPY W/ POLYPECTOMY  06/2014   Dr Elder at [facility].  3 adenomatoous polyps, anal fissure.     ESOPHAGOGASTRODUODENOSCOPY (EGD) WITH PROPOFOL  N/A 01/27/2020   Procedure: ESOPHAGOGASTRODUODENOSCOPY (EGD) WITH PROPOFOL ;  Surgeon: Shila Gustav GAILS, MD;  Location: [facility] ENDOSCOPY;  Service: Endoscopy;  Laterality: N/A;   INSERTION OF PERMANENT INTRAPERITONEAL CANNULA/CATH N/A     LAPAROSCOPIC   IR 3D INDEPENDENT WKST  01/06/2020   IR ANGIO INTRA EXTRACRAN SEL INTERNAL CAROTID BILAT MOD SED  01/06/2020   IR ANGIO VERTEBRAL SEL VERTEBRAL UNI L MOD SED  01/06/2020   IR ANGIOGRAM FOLLOW UP STUDY  01/06/2020   IR ANGIOGRAM FOLLOW UP STUDY  01/06/2020   IR ANGIOGRAM FOLLOW UP STUDY  01/06/2020   IR ANGIOGRAM FOLLOW UP STUDY  01/06/2020   IR ANGIOGRAM FOLLOW UP STUDY  01/06/2020   IR ANGIOGRAM FOLLOW UP STUDY  01/06/2020   IR ANGIOGRAM FOLLOW UP STUDY  01/06/2020   IR ANGIOGRAM FOLLOW UP STUDY  01/06/2020   IR ANGIOGRAM FOLLOW UP STUDY  01/06/2020   IR ANGIOGRAM FOLLOW UP STUDY  01/06/2020   IR NEURO EACH ADD'L AFTER BASIC UNI RIGHT (MS)  01/06/2020   IR TRANSCATH/EMBOLIZ  01/06/2020   MASTECTOMY     MOHS SURGERY N/A    RADIOLOGY WITH ANESTHESIA N/A 01/06/2020   Procedure: IR WITH ANESTHESIA FOR ANEURYSM;  Surgeon: Lanis Pupa, MD;  Location: [facility] OR;  Service: Radiology;  Laterality: N/A;   STENT SUPPORTED EMBOLIZATION OF RIGHT ICA ANEURYSM Right    TUBAL LIGATION     VENTRICULO-PERITONEAL SHUNT PLACEMENT / LAPAROSCOPIC INSERTION PERITONEAL CATHETER N/A    Patient Active Problem List  Diagnosis Date Noted   Dyspareunia in female 04/05/2020   History of abnormal cervical Pap smear 03/10/2020   GIB (gastrointestinal bleeding) 02/28/2020   Elevated BUN    Prediabetes    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   Cerebral vasospasm    Sinus tachycardia    Dysphagia, post-stroke    Thrombocytopenia    Acute blood loss anemia    Brain aneurysm    ICH (intracerebral hemorrhage) (HCC) 01/05/2020   History of adenomatous polyp of colon 02/07/2016   Anatomical narrow angle, bilateral 12/14/2014   Fissure in ano 11/11/2014   Hemorrhoids 11/11/2014   Constipation 11/19/2013   GERD (gastroesophageal reflux disease) 03/24/2013   Irritable bowel syndrome with diarrhea 03/24/2013   Herpes zoster 05/14/2005   Abnormal Pap smear of cervix 05/15/1983   History  of cervical dysplasia 05/15/1983    ONSET DATE: 12/02/2023  REFERRING DIAG: I63.9 (ICD-10-CM) - CVA (cerebral vascular accident) (HCC)   THERAPY DIAG:  Muscle weakness (generalized)  Other lack of coordination  Abnormality of gait and mobility  Difficulty in walking, not elsewhere classified  Rationale for Evaluation and Treatment: Rehabilitation  SUBJECTIVE:                                                                                                                                                                                             SUBJECTIVE STATEMENT:   Pt reports doing okay. Reports that she did not do much of the weekend. Mild pain in bil knees. Rates 4/10 on this day.   From EVAL:Patient reports she was in hospital from [facility]- Sat (July 21st- 25th) with CVA affecting left side. I was supposed to have a knee replacement surgery in September but that is on hold for now. I was doing my prep for my colonoscopy and fell in the bathroom.  Patient reports having mild left sided weakness compared to her CVA in 2021 with heavy Right sided UE weakness. She reports bad OA affecting both knees as well.    Pt accompanied by: Husband brought patient  PERTINENT HISTORY: Previous CVA in 2021 affecting Right side. Reports going to ED  last Monday due to fall/CVA affecting Left side. Diagnosed with R CVA with Left Sided weakness.    PAIN:  Are you having pain? Yes: NPRS scale: Current = 1/10; worst= 6/10 Pain location: Bilateral knees- Arthritis  Pain description: achy yet sharp with weight bearing Aggravating factors: Increased walking, transfers Relieving factors: rest  PRECAUTIONS: Fall  RED FLAGS: None   WEIGHT BEARING RESTRICTIONS: No  FALLS: Has patient fallen in last 6 months? Yes. Number of falls fell with the  stroke  LIVING ENVIRONMENT: Lives with: lives with their spouse Lives in: House/apartment Stairs: Yes: External: 2 steps; on left going up Has following  equipment at home: Single point cane, Walker - 2 wheeled, shower chair, and Grab bars  PLOF: Independent- Retired from [employer]  PATIENT GOALS: Walk without any assistive device.  OBJECTIVE:  Note: Objective measures were completed at evaluation unless otherwise noted.  LOWER EXTREMITY MMT:    MMT Right Eval Left Eval  Hip flexion 4 4  Hip internal rotation 2- 2-  Hip external rotation 3+ 3+  Knee flexion 3+ 3+  Knee extension 4 4  Ankle dorsiflexion 4 4  (Blank rows = not tested)  TRANSFERS: Sit to stand: CGA  Assistive device utilized: Environmental consultant - 2 wheeled     Stand to sit: CGA  Assistive device utilized: Environmental consultant - 2 wheeled      GAIT: Findings: Gait Characteristics: step to pattern, decreased arm swing- Right, decreased arm swing- Left, decreased step length- Right, decreased step length- Left, decreased stance time- Right, decreased stance time- Left, decreased stride length, decreased hip/knee flexion- Right, decreased hip/knee flexion- Left, decreased ankle dorsiflexion- Right, decreased ankle dorsiflexion- Left, wide BOS, poor foot clearance- Right, and poor foot clearance- Left, Distance walked: 60 feet, Assistive device utilized:Walker - 2 wheeled, Level of assistance: CGA, and Comments:    FUNCTIONAL TESTS:  5 times sit to stand: 25.20 sec with LUE Support Timed up and go (TUG): 40 sec with RW (increased time to place right hand on walker)  6 minute walk test: 482' 10 meter walk test: 0.65 m/s Berg Balance Scale: 42/56   PATIENT SURVEYS:  Stroke Impact Scale - 65.63%                                                                                                                             TREATMENT DATE 02/03/24:  -Patient arrives to session in transport chair -seated heel slides x20 bilat for knee OA warmup  -10xSTS from elevated plinth  -standing alternate steps taps x12, no support (minGuardA)  -eyes closed x60sec  *needs a seated rest break -lateral head turns  x16 -forward lean and reach x12  -overground AMB s SPC 375ft 3:40ft  *still very anxious, about knee pain, joint noise, confidence, difficulty remembering past performances and having confidence from this.  -overground AMB c SPC LUE 310ft in 2:32 (kep tpt distracted when helped her AMB better without worry.)    PATIENT EDUCATION: Education details: PT plan of care; Discussion of symptoms of CVA and to go to ED as soon as possible. Purpose of PT for balance, strength, coordination and endurance.  Person educated: Patient Education method: Explanation Education comprehension: verbalized understanding  HOME EXERCISE PROGRAM: Access Code: [email or web address] URL: [email or web address] Date: 12/17/2023 Prepared by: Sidra Simpers  Program Notes **Be sure to perform all exercises next to a countertop or sturdy piece of furniture in case you become unsteady.  Exercises - Standing Heel Raise  with Support  - 1 x daily - 7 x weekly - 2 sets - 15 reps - Standing Toe Raises at Chair  - 1 x daily - 7 x weekly - 2 sets - 15 reps - Mini Squat with Counter Support  - 1 x daily - 7 x weekly - 2 sets - 15 reps - Lunge with Counter Support  - 1 x daily - 7 x weekly - 2 sets - 15 reps - Standing Tandem Balance with Counter Support  - 1 x daily - 7 x weekly - 2 sets - 2 reps - 30 hold - Standing Single Leg Stance with Counter Support  - 1 x daily - 7 x weekly - 2 sets - 2 reps - 30 hold  GOALS: Goals reviewed with patient? Yes  SHORT TERM GOALS: Target date: 01/21/2024  Pt will be independent with HEP in order to improve strength and balance in order to decrease fall risk and improve function at home.  Baseline: EVAL - No current formal HEP in place Goal status: INITIAL   LONG TERM GOALS: Target date: 03/03/2024  1.  Patient will complete five times sit to stand test in < 15 seconds indicating an increased LE strength and improved balance. Baseline: EVAL= 25.20 sec with LUE Support 9/2: 23.36  without UE support and CGA-min assist. 20.10 sec with LUE support Goal status: INITIAL  2.  Patient will increase Berg Balance score by > 6 points to demonstrate decreased fall risk during functional activities. Baseline: EVAL: To be assessed next visit; 12/12/2023= 42/56 9/2: 44/56  Goal status: INITIAL   3.  Patient will reduce timed up and go to <11 seconds to reduce fall risk and demonstrate improved transfer/gait ability. Baseline: EVAL = 40 sec with RW 9/2: 18.33sec with SPC Goal status: INITIAL  4.  Patient will increase 10 meter walk test to >1.78m/s as to improve gait speed for better community ambulation and to reduce fall risk. Baseline: EVAL: 0.65 m/s 9/2: 0.67 m/s with [facility] 9/9: 0.71 m/s without an AD and wearing 2.5 # AW Goal status: INITIAL  5. Patient will increase six minute walk test distance by 200 feet for progression to community ambulator and improve gait ability Baseline: EVAL= 482' 9/2: 465 feet  using a SPC with CGA required seated rest break at 4:20min  Goal status: INITIAL   ASSESSMENT:  CLINICAL IMPRESSION: Continued with overground activity. Confidence in balance remains limited at times. Orthostatic and post exercise vitals reassuring. Knee pain remains a limitation overall. Pt will continue to benefit from skilled therapy to address remaining deficits in order to improve overall QoL and return to PLOF.     OBJECTIVE IMPAIRMENTS: Abnormal gait, decreased activity tolerance, decreased balance, decreased coordination, decreased endurance, decreased mobility, difficulty walking, decreased ROM, decreased strength, impaired UE functional use, and pain.   ACTIVITY LIMITATIONS: carrying, lifting, bending, sitting, standing, squatting, sleeping, stairs, and transfers  PARTICIPATION LIMITATIONS: meal prep, cleaning, laundry, driving, shopping, community activity, and yard work  PERSONAL FACTORS: 1-2 comorbidities: OA, previous CVA with significant R sided weakness  are also affecting patient's functional outcome.   REHAB POTENTIAL: Good  CLINICAL DECISION MAKING: Stable/uncomplicated  EVALUATION COMPLEXITY: Moderate  PLAN:  PT FREQUENCY: 1-2x/week  PT DURATION: 12 weeks  PLANNED INTERVENTIONS: [id number]- PT Re-evaluation, 97750- Physical Performance Testing, 97110-Therapeutic exercises, 97530- Therapeutic activity, [id number]- Neuromuscular re-education, 97535- Self Care, [id number]- Manual therapy, [id number]- Gait training, [id number]- Orthotic Initial, [id number]- Orthotic/Prosthetic subsequent, [phone]- Canalith repositioning, [id number]- Electrical stimulation (manual), [phone] (  1-2 muscles), [id number] (3+ muscles)- Dry Needling, Patient/Family education, Balance training, Stair training, Taping, Joint mobilization, Joint manipulation, Spinal manipulation, Spinal mobilization, Compression bandaging, Vestibular training, DME instructions, Cryotherapy, and Moist heat  PLAN FOR NEXT SESSION:  Continue  Curb step navigation training  B LE functional strengthening Sit<>stands Step-ups  Gait training with LRAD with increased activity tolerance.   2:16 PM, 02/03/24 Peggye JAYSON Linear, PT, DPT Physical Therapist - [facility]  Outpatient Physical Therapy- Main Campus [phone]

## 2024-02-03 NOTE — Therapy (Signed)
 OUTPATIENT OCCUPATIONAL THERAPY NEURO TREATMENT NOTE   Patient Name: Erika Stanton MRN: [id number] DOB:[birth year], 68 y.o., female Today's Date: 02/03/2024  PCP: Laurice Anes, MD REFERRING PROVIDER: Laurice Anes, MD   OT End of Session - 02/03/24 2218     Visit Number 14    Number of Visits 24    Date for Recertification  03/03/24    Authorization Time Period Reporting period beginning 01/16/24    OT Start Time 1445    OT Stop Time 1530    OT Time Calculation (min) 45 min    Equipment Utilized During Treatment transport chair    Activity Tolerance Patient tolerated treatment well    Behavior During Therapy WFL for tasks assessed/performed         Past Medical History:  Diagnosis Date   Anatomical narrow angle, bilateral    Aneurysm    Asthma    Cerebral aneurysm    Cerebral vasospasm    Cognitive change    Dysphagia, post-stroke    GERD (gastroesophageal reflux disease)    Hemorrhoids    History of cervical dysplasia    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 no recurrent polyps.  suggest repeat surveillance study 11/2022.     COLONOSCOPY W/ POLYPECTOMY  06/2014   Dr Elder at [facility].  3 adenomatoous polyps, anal fissure.     ESOPHAGOGASTRODUODENOSCOPY (EGD) WITH PROPOFOL  N/A 01/27/2020   Procedure: ESOPHAGOGASTRODUODENOSCOPY (EGD) WITH PROPOFOL ;  Surgeon: Shila Gustav GAILS, MD;  Location: [facility] ENDOSCOPY;  Service: Endoscopy;  Laterality: N/A;   INSERTION OF PERMANENT INTRAPERITONEAL CANNULA/CATH N/A    LAPAROSCOPIC   IR 3D INDEPENDENT  WKST  01/06/2020   IR ANGIO INTRA EXTRACRAN SEL INTERNAL CAROTID BILAT MOD SED  01/06/2020   IR ANGIO VERTEBRAL SEL VERTEBRAL UNI L MOD SED  01/06/2020   IR ANGIOGRAM FOLLOW UP STUDY  01/06/2020   IR ANGIOGRAM FOLLOW UP STUDY  01/06/2020   IR ANGIOGRAM FOLLOW UP STUDY  01/06/2020   IR ANGIOGRAM FOLLOW UP STUDY  01/06/2020   IR ANGIOGRAM FOLLOW UP STUDY  01/06/2020   IR ANGIOGRAM FOLLOW UP STUDY  01/06/2020   IR ANGIOGRAM FOLLOW UP STUDY  01/06/2020   IR ANGIOGRAM FOLLOW UP STUDY  01/06/2020   IR ANGIOGRAM FOLLOW UP STUDY  01/06/2020   IR ANGIOGRAM FOLLOW UP STUDY  01/06/2020   IR NEURO EACH ADD'L AFTER BASIC UNI RIGHT (MS)  01/06/2020   IR TRANSCATH/EMBOLIZ  01/06/2020   MASTECTOMY     MOHS SURGERY N/A    RADIOLOGY WITH ANESTHESIA N/A 01/06/2020   Procedure: IR WITH ANESTHESIA FOR ANEURYSM;  Surgeon: Lanis Pupa, MD;  Location: [facility] OR;  Service: Radiology;  Laterality: N/A;   STENT SUPPORTED EMBOLIZATION OF RIGHT ICA ANEURYSM Right    TUBAL LIGATION     VENTRICULO-PERITONEAL SHUNT PLACEMENT / LAPAROSCOPIC INSERTION PERITONEAL CATHETER N/A    Patient Active Problem List   Diagnosis Date Noted   Dyspareunia in female 04/05/2020  History of abnormal cervical Pap smear 03/10/2020   GIB (gastrointestinal bleeding) 02/28/2020   Elevated BUN    Prediabetes    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   Cerebral vasospasm    Sinus tachycardia    Dysphagia, post-stroke    Thrombocytopenia    Acute blood loss anemia    Brain aneurysm    ICH (intracerebral hemorrhage) (HCC) 01/05/2020   History of adenomatous polyp of colon 02/07/2016   Anatomical narrow angle, bilateral 12/14/2014   Fissure in ano 11/11/2014   Hemorrhoids 11/11/2014   Constipation 11/19/2013   GERD (gastroesophageal reflux disease) 03/24/2013   Irritable bowel syndrome with diarrhea 03/24/2013   Herpes zoster 05/14/2005   Abnormal Pap smear of cervix 05/15/1983   History of cervical dysplasia 05/15/1983    ONSET DATE: 12/02/2023  REFERRING DIAG: L ischemic CVA  THERAPY DIAG:  Muscle weakness (generalized)  Rationale for Evaluation and Treatment: Rehabilitation  SUBJECTIVE:  SUBJECTIVE STATEMENT: Pt reports that  she was told she doesn't need speech therapy, but was hoping they would help with her confidence when walking. Pt accompanied by: self  PERTINENT HISTORY:  Pt. Was admitted to [facility] from 12/02/23-12/06/23 with an Acute Right MCA CVA, Hydrocephalus with shunt  placement. History of Subarachnoid Hemorrhage with bilateral ICA coiled aneurysms. 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  PRECAUTIONS: None  WEIGHT BEARING RESTRICTIONS: No  PAIN:  02/03/2024: 7/10 pain in the  right hand, and joints of the right hand 01/29/24: No reports of pain  01/27/24: 4/10 bilat knees 01/23/24: no reports of pain 01/16/24: 4/10 bilat knees 01/14/24: No reports of pain 01/09/24: 4/10 pain bilat knees  Are you having pain? No  FALLS: Has patient fallen in last 6 months? Yes. Number of falls 1  LIVING ENVIRONMENT: Lives with: lives with their family and lives with their spouse Lives in: House/apartment Stairs: hand rails, 2 steps   Has following equipment at home: [name], cane  PLOF: Independent with basic ADLs, Spouse help as needed  PATIENT GOALS: Strengthening  OBJECTIVE:  Note: Objective measures were completed at Evaluation unless otherwise noted.  HAND DOMINANCE: Left  ADLs: Overall ADLs: spouse assists when needed, Pt. reports that she can do most tasks herself. Uses her left hand to engage in self-care tasks. Eating: Able to cut foods with a fork, husband assists as needed.  Grooming: husband assists with  applying toothpaste, able to brush teeth and hair UB Dressing: Spouse assists with applying/fastening bra, Pt. Requires assistance with zippers, and buttons. LB Dressing: Spouse assists with tying shoes when needed, uses slip on shoes. Pt. Does not zip pants. Toileting: Able to complete independently Bathing: Pt. Requires intermittent assistance with bathing tasks. Tub Shower transfers: Spouse assists with transfers when/if needed Equipment: Shower seat with back  IADLs: Shopping: Spouse typically does the shopping, isnt able to carry heavy objects.  Light housekeeping: Spouse typically does most of the cleaning around the home, Pt. Cleans bathroom, and laundry Pt. Is unable to carry laundry due to exacerbation of weakness.  Meal Prep: Spouse typically cooks and prepares meals.  Community mobility: Pt. Requires assistance using walker. Pt. reports that she is unable to carry heavy items, and typically does not do the shopping.  Medication management: Pt. is able to take care  of meds with pillbox Financial management:  Spouse takes care of it, and makes online payments.  Handwriting: 50% legible  MOBILITY STATUS: Needs Assist: Pt. Requires use of a walker   POSTURE COMMENTS:  No Significant postural limitations Sitting balance: Good  ACTIVITY TOLERANCE: Activity tolerance:   FUNCTIONAL OUTCOME MEASURES: TBD  UPPER EXTREMITY ROM:    Active ROM Right eval Right 01/16/24 Left eval Left 01/16/24  Shoulder flexion 94(121) 100 with scaption (132) 102(125) 134 with scaption (145)  Shoulder abduction 75(106) 74 (98) 110(126) 140 (170)  Shoulder adduction      Shoulder extension      Shoulder internal rotation      Shoulder external rotation      Elbow flexion 135(155)  140(150)   Elbow extension 0  -10(-9)   Wrist flexion 40(44)  42(58)   Wrist extension 30(34)  40(50)   Wrist ulnar deviation      Wrist radial deviation      Wrist pronation      Wrist supination      (Blank rows =  not tested)  UPPER EXTREMITY MMT:     MMT Right eval Left eval  Shoulder flexion 3-/5 3-/5  Shoulder abduction 3-/5 3-/5  Shoulder adduction    Shoulder extension    Shoulder internal rotation    Shoulder external rotation    Middle trapezius    Lower trapezius    Elbow flexion 4-/5 4+/5  Elbow extension 4-/5 4+/5  Wrist flexion 4-/5 3+/5  Wrist extension 4-/5 3+/5  Wrist ulnar deviation    Wrist radial deviation    Wrist pronation    Wrist supination    (Blank rows = not tested)  HAND FUNCTION: Grip strength: Right: N/T lbs; Left: 43 lbs, Lateral pinch: Right: N/T lbs, Left: 9 lbs, and 3 point pinch: Right: N/T lbs, Left: 5 lbs Pt. Presents with flexor tightness in the right 5th digit, however is able to passively extend 5th digit within normal range. Pt. Also Presents with hyper extension in the 2nd and 3rd digit at PIP joints. Pt. Is able to achieve full Digit MP, PIP, and DIP PROM.  01/16/24: Grip strength: Left: 33 lbs; Lateral pinching: Left: 8 lbs; 3 point pinch: Left: 3 lbs  COORDINATION: 9 Hole Peg test: Right: N/T sec; Left: 51 sec 01/16/24: Left: 1 min 1 sec  SENSATION: Not tested  EDEMA:   MUSCLE TONE: Fluctuating flexor tone in R hand.  COGNITION: Overall cognitive status: Within functional limits for tasks assessed  VISION: Subjective report:  Baseline vision:  Visual history:   VISION ASSESSMENT:  PERCEPTION:   PRAXIS:   OBSERVATIONS: Pt. presents with fluctuating flexor tone in the R hand, involving the 5th digit at the PIP/DIP joint. Pt. Presents with hyperextension at the 2nd and 3rd digits at the PIP joints.  TREATMENT DATE: 02/03/24  Contrast Bath:   Contrasting heat pack for 3 min. followed by cold pack for 1 min. for 3 trials ending with 3 min. of heat for a total of 15 min. to the Right hand 2/2 pain, and stiffness. Contrast  bath was performed in preparation for manual therapy, and there. Ex.     Manual Therapy:   -Pt. Tolerated STM to the volar surface of the right hand and digits, thumb CMC, MP, and IP joints, thenar eminence, hypothenar eminence, as well as 2nd through 5th digit MCPs, PIPs, and DIPs. -Manual therapy was performed independent of, and in preparation for therapeutic Ex.    Therapeutic Ex.:    -Pt. tolerated right hand PROM/AAROM through all joint ranges with the right hand.   PATIENT EDUCATION: Education details: Contrasting heat/cold pack to the right hand. Person educated: Patient Education method: Explanation and Verbal cues Education comprehension: verbalized understanding and needs further education  HOME EXERCISE PROGRAM: -Pink theraputty; visual handout issued -Self and caregiver assisted PROM to R hand and wrist  GOALS: Goals reviewed with patient? Yes  SHORT TERM GOALS: Target date: 01/21/2024    Pt. Will be independent with HEP for UE functioning. Baseline: Eval:No current HEP; 01/16/24: pt reports doing a little writing and squeezing a ball, but denies using putty; spouse stretches RUE daily Goal status: in progress  LONG TERM GOALS: Target date: 03/03/2024    Pt. Will improve [facility] skills to be able to be able to manipulate small objects at home Baseline: 9 hole peg test: L: 51 seconds; 01/16/24: L 1 min 1 sec Goal status: in progress  2.  Pt. Will increase BUE shoulder ROM by 10 degrees to be able to reach into cabinets and shelves.  Baseline: Shoulder flexion: R: 94(121), L: 102(125), Shoulder abduction: R: 75(106), L: 110(126); 01/16/24: Shoulder flexion: R: 100 with scaption (132), L: 134 with scaption (145), Shoulder abduction: R: 74 (98), L: 140 (170) Goal status: in progress  3.  Pt. Will improve L grip strength by 5# to be able to securely grasp ADL items at home.  Baseline: Grip strength: R: NT, L: 43#; 01/16/24: L: 33# Goal status: in progress  4.  Pt. Will  increase L lateral key pinch strength by 3#  to open wide mouth jars.  Baseline: Lateral key pinch: R: NT, L: 9#; 01/16/24: L: 8# Goal status: in progress  5.  Pt. Will independently engage the right hand as a gross assist to the left hand 100% of the time during ADLs/IADLs. Baseline: Eval: Pt. With limited engagement of the right hand as a gross assist during tasks; 01/16/24: Observed <25% of the time during OT sessions  Goal status: in progress  6.  Pt. will improve left hand [facility] skills by 3 sec. of speed to be able to manipulate zippers, and buttons efficiently. Baseline: Eval:Right: NT Left: 51 sec; 01/16/24: Left: 1 min and 1 sec  Goal status: in progress  7. Pt. Will improve handwriting legibility to 100% in printed form for written correspondence.  Baseline: 50% legible in printed form for first name only; 01/16/24: 80-90% legible to print first name 1 of 4 attempts.  3 of 4 attempts 50% or less legible Goal status: in progress  ASSESSMENT: CLINICAL IMPRESSION:  Upon arrival Pt. reported having increased pain in the right hand. Pt. responded well to treatment, and reported pain in the right hand significantly improved following treatment this afternoon. Pt. was provided with a visual handout regarding contrasting  heat/cold at home. Pt. continues to benefit from OT services to increase BUE ROM in the shoulder, wrist and hands, as well as increasing L grip/pinch strength and improve [facility] skills to be able to perform her desired ADL/IADLs.   PERFORMANCE DEFICITS: in functional skills including ADLs, IADLs, coordination, dexterity, edema, tone, ROM, strength, pain, Fine motor control, Gross motor control, endurance, and UE functional use, and psychosocial skills including coping strategies, environmental adaptation, habits, and routines and behaviors.   IMPAIRMENTS: are limiting patient from ADLs, IADLs, rest and sleep, leisure, and social participation.   CO-MORBIDITIES: may have co-morbidities   that affects occupational performance. Patient will benefit from skilled OT to address above impairments and improve overall function.  MODIFICATION OR ASSISTANCE TO COMPLETE EVALUATION: Min-Moderate modification of tasks or assist with assess necessary to complete an evaluation.  OT OCCUPATIONAL PROFILE AND HISTORY: Detailed assessment: Review of records and additional review of physical, cognitive, psychosocial history related to current functional performance.  CLINICAL DECISION MAKING: Moderate - several treatment options, min-mod task modification necessary  REHAB POTENTIAL: Good  EVALUATION COMPLEXITY: Moderate    PLAN:  OT FREQUENCY: 2x/week  OT DURATION: 12 weeks  PLANNED INTERVENTIONS: 97168 OT Re-evaluation, 97535 self care/ADL training, 02889 therapeutic exercise, 97530 therapeutic activity, 97112 neuromuscular re-education, 97140 manual therapy, 97018 paraffin, 02960 fluidotherapy, 97010 moist heat, 97010 cryotherapy, 97034 contrast bath, 97032 electrical stimulation (manual), 97760 Orthotic Initial, passive range of motion, energy conservation, coping strategies training, patient/family education, and DME and/or AE instructions  RECOMMENDED OTHER SERVICES: PT   CONSULTED AND AGREED WITH PLAN OF CARE: Patient  PLAN FOR NEXT SESSION: Treatment  Richardson Otter, MS, OTR/L   02/03/2024, 10:25 PM

## 2024-02-05 ENCOUNTER — Encounter: Admitting: Speech Pathology

## 2024-02-05 ENCOUNTER — Ambulatory Visit

## 2024-02-05 DIAGNOSIS — R269 Unspecified abnormalities of gait and mobility: Secondary | ICD-10-CM

## 2024-02-05 DIAGNOSIS — R262 Difficulty in walking, not elsewhere classified: Secondary | ICD-10-CM

## 2024-02-05 DIAGNOSIS — R278 Other lack of coordination: Secondary | ICD-10-CM

## 2024-02-05 DIAGNOSIS — M6281 Muscle weakness (generalized): Secondary | ICD-10-CM | POA: Diagnosis not present

## 2024-02-05 NOTE — Therapy (Addendum)
 OUTPATIENT PHYSICAL THERAPY TREATMENT   Patient Name: Erika Stanton MRN: [id number] DOB:1955-06-03, 68 y.o., female Today's Date: 02/05/2024  PCP: Dr. Oneil Galloway  REFERRING PROVIDER: Dr. Oneil Galloway  END OF SESSION:  PT End of Session - 02/05/24 1105     Visit Number 16    Number of Visits 24    Date for Recertification  03/03/24    Authorization Type [employer]    Progress Note Due on Visit 20    PT Start Time 1102    PT Stop Time 1142    PT Time Calculation (min) 40 min    Equipment Utilized During Treatment Gait belt    Activity Tolerance Patient tolerated treatment well;No increased pain    Behavior During Therapy WFL for tasks assessed/performed           Past Medical History:  Diagnosis Date   Anatomical narrow angle, bilateral    Aneurysm    Asthma    Cerebral aneurysm    Cerebral vasospasm    Cognitive change    Dysphagia, post-stroke    GERD (gastroesophageal reflux disease)    Hemorrhoids    History of cervical dysplasia    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 no recurrent polyps.  suggest repeat surveillance study 11/2022.     COLONOSCOPY W/ POLYPECTOMY  06/2014   Dr Elder at [facility].  3 adenomatoous polyps, anal fissure.     ESOPHAGOGASTRODUODENOSCOPY (EGD) WITH PROPOFOL  N/A 01/27/2020   Procedure: ESOPHAGOGASTRODUODENOSCOPY (EGD) WITH PROPOFOL ;  Surgeon: Shila Gustav GAILS, MD;  Location: [facility] ENDOSCOPY;  Service: Endoscopy;  Laterality: N/A;   INSERTION OF PERMANENT INTRAPERITONEAL CANNULA/CATH N/A     LAPAROSCOPIC   IR 3D INDEPENDENT WKST  01/06/2020   IR ANGIO INTRA EXTRACRAN SEL INTERNAL CAROTID BILAT MOD SED  01/06/2020   IR ANGIO VERTEBRAL SEL VERTEBRAL UNI L MOD SED  01/06/2020   IR ANGIOGRAM FOLLOW UP STUDY  01/06/2020   IR ANGIOGRAM FOLLOW UP STUDY  01/06/2020   IR ANGIOGRAM FOLLOW UP STUDY  01/06/2020   IR ANGIOGRAM FOLLOW UP STUDY  01/06/2020   IR ANGIOGRAM FOLLOW UP STUDY  01/06/2020   IR ANGIOGRAM FOLLOW UP STUDY  01/06/2020   IR ANGIOGRAM FOLLOW UP STUDY  01/06/2020   IR ANGIOGRAM FOLLOW UP STUDY  01/06/2020   IR ANGIOGRAM FOLLOW UP STUDY  01/06/2020   IR ANGIOGRAM FOLLOW UP STUDY  01/06/2020   IR NEURO EACH ADD'L AFTER BASIC UNI RIGHT (MS)  01/06/2020   IR TRANSCATH/EMBOLIZ  01/06/2020   MASTECTOMY     MOHS SURGERY N/A    RADIOLOGY WITH ANESTHESIA N/A 01/06/2020   Procedure: IR WITH ANESTHESIA FOR ANEURYSM;  Surgeon: Lanis Pupa, MD;  Location: [facility] OR;  Service: Radiology;  Laterality: N/A;   STENT SUPPORTED EMBOLIZATION OF RIGHT ICA ANEURYSM Right    TUBAL LIGATION     VENTRICULO-PERITONEAL SHUNT PLACEMENT / LAPAROSCOPIC INSERTION PERITONEAL CATHETER N/A    Patient Active Problem List  Diagnosis Date Noted   Dyspareunia in female 04/05/2020   History of abnormal cervical Pap smear 03/10/2020   GIB (gastrointestinal bleeding) 02/28/2020   Elevated BUN    Prediabetes    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   Cerebral vasospasm    Sinus tachycardia    Dysphagia, post-stroke    Thrombocytopenia    Acute blood loss anemia    Brain aneurysm    ICH (intracerebral hemorrhage) (HCC) 01/05/2020   History of adenomatous polyp of colon 02/07/2016   Anatomical narrow angle, bilateral 12/14/2014   Fissure in ano 11/11/2014   Hemorrhoids 11/11/2014   Constipation 11/19/2013   GERD (gastroesophageal reflux disease) 03/24/2013   Irritable bowel syndrome with diarrhea 03/24/2013   Herpes zoster 05/14/2005   Abnormal Pap smear of cervix 05/15/1983   History  of cervical dysplasia 05/15/1983    ONSET DATE: 12/02/2023  REFERRING DIAG: I63.9 (ICD-10-CM) - CVA (cerebral vascular accident) (HCC)   THERAPY DIAG:  Muscle weakness (generalized)  Other lack of coordination  Abnormality of gait and mobility  Difficulty in walking, not elsewhere classified  Rationale for Evaluation and Treatment: Rehabilitation  SUBJECTIVE:                                                                                                                                                                                             SUBJECTIVE STATEMENT:   Pt reports no updates today.   PERTINENT HISTORY:  Patient reports she was in hospital from [facility]- Sat (July 21st- 25th) with CVA affecting left side. I was supposed to have a knee replacement surgery in September but that is on hold for now. I was doing my prep for my colonoscopy and fell in the bathroom.  Patient reports having mild left sided weakness compared to her CVA in 2021 with heavy Right sided UE weakness. She reports bad OA affecting both knees as well. Previous CVA in 2021 affecting Right side. Reports going to ED  last Monday due to fall/CVA affecting Left side. Diagnosed with R CVA with Left Sided weakness.    PAIN:  Are you having pain? 3/10 bilat knees   PRECAUTIONS: Fall  WEIGHT BEARING RESTRICTIONS: No  FALLS: Has patient fallen in last 6 months? Yes. Number of falls fell with the stroke  PATIENT GOALS: Walk without any assistive device.  OBJECTIVE:  Note: Objective measures were completed at evaluation unless otherwise noted.  LOWER EXTREMITY MMT:    MMT Right Eval Left Eval  Hip flexion 4 4  Hip internal rotation 2- 2-  Hip external rotation 3+ 3+  Knee flexion 3+ 3+  Knee extension 4  4  Ankle dorsiflexion 4 4  (Blank rows = not tested)  FUNCTIONAL TESTS:  5 times sit to stand: 25.20 sec with LUE Support Timed up and go (TUG): 40 sec with RW (increased time to place right hand on  walker)  6 minute walk test: 482' 10 meter walk test: 0.65 m/s Berg Balance Scale: 42/56   PATIENT SURVEYS:  Stroke Impact Scale - 65.63%                                                                                                                             TREATMENT DATE 02/05/24:  -Patient arrives to session in transport chair -overground AMB with SPC: 427ft (LL lap, 6:36)  -overground AMB with SPC: 478ft (LL lap, 7:01) (triad   -AMB to // bars without device -the donning of 3lb AW bilat, then side stepping in bars 3x each, and forward backward stepping in bars 3x each   PATIENT EDUCATION: Education details: PT plan of care; Discussion of symptoms of CVA and to go to ED as soon as possible. Purpose of PT for balance, strength, coordination and endurance.  Person educated: Patient Education method: Explanation Education comprehension: verbalized understanding  HOME EXERCISE PROGRAM: Access Code: [email or web address] URL: [email or web address] Date: 12/17/2023 Prepared by: Sidra Simpers  Program Notes **Be sure to perform all exercises next to a countertop or sturdy piece of furniture in case you become unsteady.  Exercises - Standing Heel Raise with Support  - 1 x daily - 7 x weekly - 2 sets - 15 reps - Standing Toe Raises at Chair  - 1 x daily - 7 x weekly - 2 sets - 15 reps - Mini Squat with Counter Support  - 1 x daily - 7 x weekly - 2 sets - 15 reps - Lunge with Counter Support  - 1 x daily - 7 x weekly - 2 sets - 15 reps - Standing Tandem Balance with Counter Support  - 1 x daily - 7 x weekly - 2 sets - 2 reps - 30 hold - Standing Single Leg Stance with Counter Support  - 1 x daily - 7 x weekly - 2 sets - 2 reps - 30 hold  GOALS: Goals reviewed with patient? Yes  SHORT TERM GOALS: Target date: 01/21/2024  Pt will be independent with HEP in order to improve strength and balance in order to decrease fall risk and improve function at home.  Baseline: EVAL - No  current formal HEP in place Goal status: INITIAL   LONG TERM GOALS: Target date: 03/03/2024  1.  Patient will complete five times sit to stand test in < 15 seconds indicating an increased LE strength and improved balance. Baseline: EVAL= 25.20 sec with LUE Support 9/2: 23.36 without UE support and CGA-min assist. 20.10 sec with LUE support Goal status: INITIAL  2.  Patient will increase Berg Balance score by > 6 points to demonstrate decreased fall risk during functional activities. Baseline: EVAL:  To be assessed next visit; 12/12/2023= 42/56 9/2: 44/56  Goal status: INITIAL   3.  Patient will reduce timed up and go to <11 seconds to reduce fall risk and demonstrate improved transfer/gait ability. Baseline: EVAL = 40 sec with RW 9/2: 18.33sec with SPC Goal status: INITIAL  4.  Patient will increase 10 meter walk test to >1.75m/s as to improve gait speed for better community ambulation and to reduce fall risk. Baseline: EVAL: 0.65 m/s 9/2: 0.67 m/s with [facility] 9/9: 0.71 m/s without an AD and wearing 2.5 # AW Goal status: INITIAL  5. Patient will increase six minute walk test distance by 200 feet for progression to community ambulator and improve gait ability Baseline: EVAL= 482' 9/2: 465 feet  using a SPC with CGA required seated rest break at 4:56min  Goal status: INITIAL   ASSESSMENT:  CLINICAL IMPRESSION: Trial AMB in hallway today on LL, a novel route, pt not as tied into the two lap norm hence she is able to AMB farther with only 1 DOE break. Pt is later pleased to learn of how far she walked today in distance. Returned to resisted step training in // bars to make those muscles bigger and stronger. Pt will continue to benefit from skilled therapy to address remaining deficits in order to improve overall QoL and return to PLOF.     OBJECTIVE IMPAIRMENTS: Abnormal gait, decreased activity tolerance, decreased balance, decreased coordination, decreased endurance, decreased  mobility, difficulty walking, decreased ROM, decreased strength, impaired UE functional use, and pain.   ACTIVITY LIMITATIONS: carrying, lifting, bending, sitting, standing, squatting, sleeping, stairs, and transfers  PARTICIPATION LIMITATIONS: meal prep, cleaning, laundry, driving, shopping, community activity, and yard work  PERSONAL FACTORS: 1-2 comorbidities: OA, previous CVA with significant R sided weakness are also affecting patient's functional outcome.   REHAB POTENTIAL: Good  CLINICAL DECISION MAKING: Stable/uncomplicated  EVALUATION COMPLEXITY: Moderate  PLAN:  PT FREQUENCY: 1-2x/week  PT DURATION: 12 weeks  PLANNED INTERVENTIONS: 97164- PT Re-evaluation, 97750- Physical Performance Testing, 97110-Therapeutic exercises, 97530- Therapeutic activity, [id number]- Neuromuscular re-education, 97535- Self Care, [id number]- Manual therapy, [id number]- Gait training, [phone]- Orthotic Initial, [phone]- Orthotic/Prosthetic subsequent, [phone]- Canalith repositioning, [phone]- Electrical stimulation (manual), [phone] (1-2 muscles), [id number] (3+ muscles)- Dry Needling, Patient/Family education, Balance training, Stair training, Taping, Joint mobilization, Joint manipulation, Spinal manipulation, Spinal mobilization, Compression bandaging, Vestibular training, DME instructions, Cryotherapy, and Moist heat  PLAN FOR NEXT SESSION:  Continue  Curb step navigation training  B LE functional strengthening Sit<>stands Step-ups  Gait training with LRAD with increased activity tolerance.   11:07 AM, 02/05/24 Peggye JAYSON Linear, PT, DPT Physical Therapist - [facility]  Outpatient Physical Therapy- Main [facility] [phone]

## 2024-02-05 NOTE — Therapy (Signed)
 OUTPATIENT OCCUPATIONAL THERAPY NEURO TREATMENT NOTE   Patient Name: Erika Stanton MRN: [id number] DOB:[birth year], 68 y.o., female Today's Date: 02/05/2024  PCP: Laurice Anes, MD REFERRING PROVIDER: Laurice Anes, MD   OT End of Session - 02/05/24 1330     Visit Number 15    Number of Visits 24    Date for Recertification  03/03/24    Authorization Time Period Reporting period beginning 01/16/24    OT Start Time 1145    OT Stop Time 1230    OT Time Calculation (min) 45 min    Equipment Utilized During Treatment transport chair    Activity Tolerance Patient tolerated treatment well    Behavior During Therapy WFL for tasks assessed/performed         Past Medical History:  Diagnosis Date   Anatomical narrow angle, bilateral    Aneurysm    Asthma    Cerebral aneurysm    Cerebral vasospasm    Cognitive change    Dysphagia, post-stroke    GERD (gastroesophageal reflux disease)    Hemorrhoids    History of cervical dysplasia    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 no recurrent polyps.  suggest repeat surveillance study 11/2022.     COLONOSCOPY W/ POLYPECTOMY  06/2014   Dr Elder at [facility].  3 adenomatoous polyps, anal fissure.     ESOPHAGOGASTRODUODENOSCOPY (EGD) WITH PROPOFOL  N/A 01/27/2020   Procedure: ESOPHAGOGASTRODUODENOSCOPY (EGD) WITH PROPOFOL ;  Surgeon: Shila Gustav GAILS, MD;  Location: [facility] ENDOSCOPY;  Service: Endoscopy;  Laterality: N/A;   INSERTION OF PERMANENT INTRAPERITONEAL CANNULA/CATH N/A    LAPAROSCOPIC   IR 3D INDEPENDENT  [facility]  01/06/2020   IR ANGIO INTRA EXTRACRAN SEL INTERNAL CAROTID BILAT MOD SED  01/06/2020   IR ANGIO VERTEBRAL SEL VERTEBRAL UNI L MOD SED  01/06/2020   IR ANGIOGRAM FOLLOW UP STUDY  01/06/2020   IR ANGIOGRAM FOLLOW UP STUDY  01/06/2020   IR ANGIOGRAM FOLLOW UP STUDY  01/06/2020   IR ANGIOGRAM FOLLOW UP STUDY  01/06/2020   IR ANGIOGRAM FOLLOW UP STUDY  01/06/2020   IR ANGIOGRAM FOLLOW UP STUDY  01/06/2020   IR ANGIOGRAM FOLLOW UP STUDY  01/06/2020   IR ANGIOGRAM FOLLOW UP STUDY  01/06/2020   IR ANGIOGRAM FOLLOW UP STUDY  01/06/2020   IR ANGIOGRAM FOLLOW UP STUDY  01/06/2020   IR NEURO EACH ADD'L AFTER BASIC UNI RIGHT (MS)  01/06/2020   IR TRANSCATH/EMBOLIZ  01/06/2020   MASTECTOMY     MOHS SURGERY N/A    RADIOLOGY WITH ANESTHESIA N/A 01/06/2020   Procedure: IR WITH ANESTHESIA FOR ANEURYSM;  Surgeon: Lanis Pupa, MD;  Location: [facility] OR;  Service: Radiology;  Laterality: N/A;   STENT SUPPORTED EMBOLIZATION OF RIGHT ICA ANEURYSM Right    TUBAL LIGATION     VENTRICULO-PERITONEAL SHUNT PLACEMENT / LAPAROSCOPIC INSERTION PERITONEAL CATHETER N/A    Patient Active Problem List   Diagnosis Date Noted   Dyspareunia in female 04/05/2020  History of abnormal cervical Pap smear 03/10/2020   GIB (gastrointestinal bleeding) 02/28/2020   Elevated BUN    Prediabetes    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   Cerebral vasospasm    Sinus tachycardia    Dysphagia, post-stroke    Thrombocytopenia    Acute blood loss anemia    Brain aneurysm    ICH (intracerebral hemorrhage) (HCC) 01/05/2020   History of adenomatous polyp of colon 02/07/2016   Anatomical narrow angle, bilateral 12/14/2014   Fissure in ano 11/11/2014   Hemorrhoids 11/11/2014   Constipation 11/19/2013   GERD (gastroesophageal reflux disease) 03/24/2013   Irritable bowel syndrome with diarrhea 03/24/2013   Herpes zoster 05/14/2005   Abnormal Pap smear of cervix 05/15/1983   History of cervical dysplasia 05/15/1983    ONSET DATE: 12/02/2023  REFERRING DIAG: L ischemic CVA  THERAPY DIAG:  Muscle weakness (generalized)  Other lack of coordination  Rationale for Evaluation and Treatment: Rehabilitation  SUBJECTIVE:  SUBJECTIVE STATEMENT: Pt reports continued challenges with opening toothpaste and other containers. Pt accompanied by: self  PERTINENT HISTORY:  Pt. Was admitted to [facility] from 12/02/23-12/06/23 with an Acute Right MCA CVA, Hydrocephalus with shunt  placement. History of Subarachnoid Hemorrhage with bilateral ICA coiled aneurysms. 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  PRECAUTIONS: None  WEIGHT BEARING RESTRICTIONS: No  PAIN:  02/05/24: 4/10 bilat knees Are you having pain? No  FALLS: Has patient fallen in last 6 months? Yes. Number of falls 1  LIVING ENVIRONMENT: Lives with: lives with their family and lives with their spouse Lives in: House/apartment Stairs: hand rails, 2 steps   Has following equipment at home: [name], cane  PLOF: Independent with basic ADLs, Spouse help as needed  PATIENT GOALS: Strengthening  OBJECTIVE:  Note: Objective measures were completed at Evaluation unless otherwise noted.  HAND DOMINANCE: Left  ADLs: Overall ADLs: spouse assists when needed, Pt. reports that she can do most tasks herself. Uses her left hand to engage in self-care tasks. Eating: Able to cut foods with a fork, husband assists as needed.  Grooming: husband assists with applying toothpaste, able to brush teeth and hair UB Dressing: Spouse assists with applying/fastening bra, Pt. Requires assistance with zippers, and buttons. LB Dressing: Spouse assists with tying shoes when needed, uses slip on shoes. Pt.  Does not zip pants. Toileting: Able to complete independently Bathing: Pt. Requires intermittent assistance with bathing tasks. Tub Shower transfers: Spouse assists with transfers when/if needed Equipment: Shower seat with back  IADLs: Shopping: Spouse typically does the shopping, isnt able to carry heavy objects.  Light housekeeping: Spouse typically does most of the cleaning around the home, Pt. Cleans bathroom, and laundry Pt. Is unable to carry laundry due to exacerbation of weakness.  Meal Prep: Spouse typically cooks and prepares meals.  Community mobility: Pt. Requires assistance using walker. Pt. reports that she is unable to carry heavy items, and typically does not do the shopping.  Medication management: Pt. is able to take care of meds with pillbox Financial management:  Spouse takes care of it, and makes online payments.  Handwriting: 50% legible  MOBILITY STATUS: Needs Assist: Pt. Requires use of a walker   POSTURE COMMENTS:  No Significant postural limitations Sitting balance: Good  ACTIVITY TOLERANCE: Activity tolerance:  FUNCTIONAL OUTCOME MEASURES: TBD  UPPER EXTREMITY ROM:    Active ROM Right eval Right 01/16/24 Left eval Left 01/16/24  Shoulder flexion 94(121) 100 with scaption (132) 102(125) 134 with scaption (145)  Shoulder abduction 75(106) 74 (98) 110(126) 140 (170)  Shoulder adduction      Shoulder extension      Shoulder internal rotation      Shoulder external rotation      Elbow flexion 135(155)  140(150)   Elbow extension 0  -10(-9)   Wrist flexion 40(44)  42(58)   Wrist extension 30(34)  40(50)   Wrist ulnar deviation      Wrist radial deviation      Wrist pronation      Wrist supination      (Blank rows = not tested)  UPPER EXTREMITY MMT:     MMT Right eval Left eval  Shoulder flexion 3-/5 3-/5  Shoulder abduction 3-/5 3-/5  Shoulder adduction    Shoulder extension    Shoulder internal rotation    Shoulder external rotation     Middle trapezius    Lower trapezius    Elbow flexion 4-/5 4+/5  Elbow extension 4-/5 4+/5  Wrist flexion 4-/5 3+/5  Wrist extension 4-/5 3+/5  Wrist ulnar deviation    Wrist radial deviation    Wrist pronation    Wrist supination    (Blank rows = not tested)  HAND FUNCTION: Grip strength: Right: N/T lbs; Left: 43 lbs, Lateral pinch: Right: N/T lbs, Left: 9 lbs, and 3 point pinch: Right: N/T lbs, Left: 5 lbs Pt. Presents with flexor tightness in the right 5th digit, however is able to passively extend 5th digit within normal range. Pt. Also Presents with hyper extension in the 2nd and 3rd digit at PIP joints. Pt. Is able to achieve full Digit MP, PIP, and DIP PROM.  01/16/24: Grip strength: Left: 33 lbs; Lateral pinching: Left: 8 lbs; 3 point pinch: Left: 3 lbs  COORDINATION: 9 Hole Peg test: Right: N/T sec; Left: 51 sec 01/16/24: Left: 1 min 1 sec  SENSATION: Not tested  EDEMA:   MUSCLE TONE: Fluctuating flexor tone in R hand.  COGNITION: Overall cognitive status: Within functional limits for tasks assessed  VISION: Subjective report:  Baseline vision:  Visual history:   VISION ASSESSMENT:  PERCEPTION:   PRAXIS:   OBSERVATIONS: Pt. presents with fluctuating flexor tone in the R hand, involving the 5th digit at the PIP/DIP joint. Pt. Presents with hyperextension at the 2nd and 3rd digits at the PIP joints.                                                                                                                    TREATMENT DATE: 02/05/24 Moist heat applied to R hand used intermittently throughout OT session to promote reduced spasticity/relax R hand for optimal positioning  Self Care: -Placed rolled washcloth within R palm while moist heat was placed to reduce extreme digit flexion/promote optimal positioning for healthy skin integrity -Practiced opening  various grooming containers with L hand, intermittently using legs or R hand as a stabilizer for container; vc  for positioning strategies of container in hand or lap (deodorant, lotion, toothpaste) -Reviewed cervical ROM for HEP to reduce R lateral neck flexion.  Education provided on head malalignment being result of muscle imbalance from CVAs.    Therapeutic Exercise: -L hand grip strengthening: Hand gripper set at light-moderate resistance with 2 red bands: 3 sets 15 reps with min vc for working towards full flexion of digits with each rep -L elbow strengthening: 3 lb dumbbell for L elbow flexion x2 sets 10 reps, min A to support L elbow and distally at the wrist to reduce compensation -L forearm strengthening: 2 lb dumbbell for L forearm pron/sup x2 sets 10 reps: min guard for form/technique  -L wrist strengthening: 2 lb dumbbell for L wrist flex, ext, RD/UD x2 sets 10 reps: CGA for positioning to reduce compensation -Cervical rotation and lateral flexion to the L x10 reps each   PATIENT EDUCATION: Education details: Cervical ROM Person educated: Patient Education method: Programmer, multimedia, Demonstration, and Verbal cues Education comprehension: verbalized understanding, returned demonstration, verbal cues required, and needs further education  HOME EXERCISE PROGRAM: -Pink theraputty; visual handout issued -Self and caregiver assisted PROM to R hand and wrist -Cervical ROM to reduce R lateral neck flexion  GOALS: Goals reviewed with patient? Yes  SHORT TERM GOALS: Target date: 01/21/2024    Pt. Will be independent with HEP for UE functioning. Baseline: Eval:No current HEP; 01/16/24: pt reports doing a little writing and squeezing a ball, but denies using putty; spouse stretches RUE daily Goal status: in progress  LONG TERM GOALS: Target date: 03/03/2024    Pt. Will improve [facility] skills to be able to be able to manipulate small objects at home Baseline: 9 hole peg test: L: 51 seconds; 01/16/24: L 1 min 1 sec Goal status: in progress  2.  Pt. Will increase BUE shoulder ROM by 10 degrees to be able  to reach into cabinets and shelves.  Baseline: Shoulder flexion: R: 94(121), L: 102(125), Shoulder abduction: R: 75(106), L: 110(126); 01/16/24: Shoulder flexion: R: 100 with scaption (132), L: 134 with scaption (145), Shoulder abduction: R: 74 (98), L: 140 (170) Goal status: in progress  3.  Pt. Will improve L grip strength by 5# to be able to securely grasp ADL items at home.  Baseline: Grip strength: R: NT, L: 43#; 01/16/24: L: 33# Goal status: in progress  4.  Pt. Will increase L lateral key pinch strength by 3#  to open wide mouth jars.  Baseline: Lateral key pinch: R: NT, L: 9#; 01/16/24: L: 8# Goal status: in progress  5.  Pt. Will independently engage the right hand as a gross assist to the left hand 100% of the time during ADLs/IADLs. Baseline: Eval: Pt. With limited engagement of the right hand as a gross assist during tasks; 01/16/24: Observed <25% of the time during OT sessions  Goal status: in progress  6.  Pt. will improve left hand [facility] skills by 3 sec. of speed to be able to manipulate zippers, and buttons efficiently. Baseline: Eval:Right: NT Left: 51 sec; 01/16/24: Left: 1 min and 1 sec  Goal status: in progress  7. Pt. Will improve handwriting legibility to 100% in printed form for written correspondence.  Baseline: 50% legible in printed form for first name only; 01/16/24: 80-90% legible to print first name 1 of 4 attempts.  3 of 4 attempts 50% or less  legible Goal status: in progress  ASSESSMENT: CLINICAL IMPRESSION: Pt demonstrated good ability to engage the R hand as a stabilizer to open deodorant, lotion, and toothpaste containers.  Had attempted with L hand only, but easier with use of R hand or placing containers between legs, requiring occasional min vc for positioning strategies with tight tops.  Good tolerance to light resistance dumbbells today for Lue strengthening.  Pt does require constant min guard-min A to provide proximal and distal support to reduce substitution  patterns and to promote proper technique.  Pt required several prompts for maintaining focus/attention to task, with pt frequently asking if her time was done yet.  Pt. continues to benefit from OT services to increase BUE ROM in the shoulder, wrist and hands, as well as increasing L grip/pinch strength and improve [facility] skills to be able to perform her desired ADL/IADLs.   PERFORMANCE DEFICITS: in functional skills including ADLs, IADLs, coordination, dexterity, edema, tone, ROM, strength, pain, Fine motor control, Gross motor control, endurance, and UE functional use, and psychosocial skills including coping strategies, environmental adaptation, habits, and routines and behaviors.   IMPAIRMENTS: are limiting patient from ADLs, IADLs, rest and sleep, leisure, and social participation.   CO-MORBIDITIES: may have co-morbidities  that affects occupational performance. Patient will benefit from skilled OT to address above impairments and improve overall function.  MODIFICATION OR ASSISTANCE TO COMPLETE EVALUATION: Min-Moderate modification of tasks or assist with assess necessary to complete an evaluation.  OT OCCUPATIONAL PROFILE AND HISTORY: Detailed assessment: Review of records and additional review of physical, cognitive, psychosocial history related to current functional performance.  CLINICAL DECISION MAKING: Moderate - several treatment options, min-mod task modification necessary  REHAB POTENTIAL: Good  EVALUATION COMPLEXITY: Moderate    PLAN:  OT FREQUENCY: 2x/week  OT DURATION: 12 weeks  PLANNED INTERVENTIONS: 97168 OT Re-evaluation, 97535 self care/ADL training, 02889 therapeutic exercise, 97530 therapeutic activity, 97112 neuromuscular re-education, 97140 manual therapy, 97018 paraffin, 02960 fluidotherapy, 97010 moist heat, 97010 cryotherapy, 97034 contrast bath, 97032 electrical stimulation (manual), 97760 Orthotic Initial, passive range of motion, energy conservation, coping  strategies training, patient/family education, and DME and/or AE instructions  RECOMMENDED OTHER SERVICES: PT   CONSULTED AND AGREED WITH PLAN OF CARE: Patient  PLAN FOR NEXT SESSION: Treatment  Inocente Blazing, MS, OTR/L   02/05/2024, 1:31 PM

## 2024-02-10 ENCOUNTER — Ambulatory Visit: Admitting: Physical Therapy

## 2024-02-10 ENCOUNTER — Encounter: Admitting: Speech Pathology

## 2024-02-10 ENCOUNTER — Ambulatory Visit: Admitting: Occupational Therapy

## 2024-02-10 DIAGNOSIS — R278 Other lack of coordination: Secondary | ICD-10-CM

## 2024-02-10 DIAGNOSIS — M6281 Muscle weakness (generalized): Secondary | ICD-10-CM | POA: Diagnosis not present

## 2024-02-10 DIAGNOSIS — R2689 Other abnormalities of gait and mobility: Secondary | ICD-10-CM

## 2024-02-10 DIAGNOSIS — R296 Repeated falls: Secondary | ICD-10-CM

## 2024-02-10 DIAGNOSIS — R269 Unspecified abnormalities of gait and mobility: Secondary | ICD-10-CM

## 2024-02-10 DIAGNOSIS — R262 Difficulty in walking, not elsewhere classified: Secondary | ICD-10-CM

## 2024-02-10 NOTE — Therapy (Signed)
 OUTPATIENT PHYSICAL THERAPY TREATMENT   Patient Name: Erika Stanton MRN: [id number] DOB:[birth year], 68 y.o., female Today's Date: 02/10/2024  PCP: Dr. Oneil Galloway  REFERRING PROVIDER: Dr. Oneil Galloway  END OF SESSION:  PT End of Session - 02/10/24 1405     Visit Number 17    Number of Visits 24    Date for Recertification  03/03/24    Authorization Type [employer]    Progress Note Due on Visit 20    PT Start Time 1400    PT Stop Time 1440    PT Time Calculation (min) 40 min    Equipment Utilized During Treatment Gait belt    Activity Tolerance Patient tolerated treatment well;No increased pain    Behavior During Therapy WFL for tasks assessed/performed           Past Medical History:  Diagnosis Date   Anatomical narrow angle, bilateral    Aneurysm    Asthma    Cerebral aneurysm    Cerebral vasospasm    Cognitive change    Dysphagia, post-stroke    GERD (gastroesophageal reflux disease)    Hemorrhoids    History of cervical dysplasia    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 no recurrent polyps.  suggest repeat surveillance study 11/2022.     COLONOSCOPY W/ POLYPECTOMY  06/2014   Dr Elder at [facility].  3 adenomatoous polyps, anal fissure.     ESOPHAGOGASTRODUODENOSCOPY (EGD) WITH PROPOFOL  N/A 01/27/2020   Procedure: ESOPHAGOGASTRODUODENOSCOPY (EGD) WITH PROPOFOL ;  Surgeon: Shila Gustav GAILS, MD;  Location: [facility] ENDOSCOPY;  Service: Endoscopy;  Laterality: N/A;   INSERTION OF PERMANENT INTRAPERITONEAL CANNULA/CATH N/A     LAPAROSCOPIC   IR 3D INDEPENDENT WKST  01/06/2020   IR ANGIO INTRA EXTRACRAN SEL INTERNAL CAROTID BILAT MOD SED  01/06/2020   IR ANGIO VERTEBRAL SEL VERTEBRAL UNI L MOD SED  01/06/2020   IR ANGIOGRAM FOLLOW UP STUDY  01/06/2020   IR ANGIOGRAM FOLLOW UP STUDY  01/06/2020   IR ANGIOGRAM FOLLOW UP STUDY  01/06/2020   IR ANGIOGRAM FOLLOW UP STUDY  01/06/2020   IR ANGIOGRAM FOLLOW UP STUDY  01/06/2020   IR ANGIOGRAM FOLLOW UP STUDY  01/06/2020   IR ANGIOGRAM FOLLOW UP STUDY  01/06/2020   IR ANGIOGRAM FOLLOW UP STUDY  01/06/2020   IR ANGIOGRAM FOLLOW UP STUDY  01/06/2020   IR ANGIOGRAM FOLLOW UP STUDY  01/06/2020   IR NEURO EACH ADD'L AFTER BASIC UNI RIGHT (MS)  01/06/2020   IR TRANSCATH/EMBOLIZ  01/06/2020   MASTECTOMY     MOHS SURGERY N/A    RADIOLOGY WITH ANESTHESIA N/A 01/06/2020   Procedure: IR WITH ANESTHESIA FOR ANEURYSM;  Surgeon: Lanis Pupa, MD;  Location: [facility] OR;  Service: Radiology;  Laterality: N/A;   STENT SUPPORTED EMBOLIZATION OF RIGHT ICA ANEURYSM Right    TUBAL LIGATION     VENTRICULO-PERITONEAL SHUNT PLACEMENT / LAPAROSCOPIC INSERTION PERITONEAL CATHETER N/A    Patient Active Problem List  Diagnosis Date Noted   Dyspareunia in female 04/05/2020   History of abnormal cervical Pap smear 03/10/2020   GIB (gastrointestinal bleeding) 02/28/2020   Elevated BUN    Prediabetes    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   Cerebral vasospasm    Sinus tachycardia    Dysphagia, post-stroke    Thrombocytopenia    Acute blood loss anemia    Brain aneurysm    ICH (intracerebral hemorrhage) (HCC) 01/05/2020   History of adenomatous polyp of colon 02/07/2016   Anatomical narrow angle, bilateral 12/14/2014   Fissure in ano 11/11/2014   Hemorrhoids 11/11/2014   Constipation 11/19/2013   GERD (gastroesophageal reflux disease) 03/24/2013   Irritable bowel syndrome with diarrhea 03/24/2013   Herpes zoster 05/14/2005   Abnormal Pap smear of cervix 05/15/1983   History  of cervical dysplasia 05/15/1983    ONSET DATE: 12/02/2023  REFERRING DIAG: I63.9 (ICD-10-CM) - CVA (cerebral vascular accident) (HCC)   THERAPY DIAG:  Muscle weakness (generalized)  Other lack of coordination  Abnormality of gait and mobility  Difficulty in walking, not elsewhere classified  Repeated falls  Other abnormalities of gait and mobility  Rationale for Evaluation and Treatment: Rehabilitation  SUBJECTIVE:                                                                                                                                                                                             SUBJECTIVE STATEMENT:   Pt reports no updates today.   PERTINENT HISTORY:  Patient reports she was in hospital from [facility]- Sat (July 21st- 25th) with CVA affecting left side. I was supposed to have a knee replacement surgery in September but that is on hold for now. I was doing my prep for my colonoscopy and fell in the bathroom.  Patient reports having mild left sided weakness compared to her CVA in 2021 with heavy Right sided UE weakness. She reports bad OA affecting both knees as well. Previous CVA in 2021 affecting Right side. Reports going to ED  last Monday due to fall/CVA affecting Left side. Diagnosed with R CVA with Left Sided weakness.    PAIN:  Are you having pain? 3/10 bilat knees   PRECAUTIONS: Fall  WEIGHT BEARING RESTRICTIONS: No  FALLS: Has patient fallen in last 6 months? Yes. Number of falls fell with the stroke  PATIENT GOALS: Walk without any assistive device.  OBJECTIVE:  Note: Objective measures were completed at evaluation unless otherwise noted.  LOWER EXTREMITY MMT:    MMT Right Eval Left Eval  Hip flexion 4 4  Hip internal rotation 2- 2-  Hip external rotation 3+  3+  Knee flexion 3+ 3+  Knee extension 4 4  Ankle dorsiflexion 4 4  (Blank rows = not tested)  FUNCTIONAL TESTS:  5 times sit to stand: 25.20 sec with LUE Support Timed up and  go (TUG): 40 sec with RW (increased time to place right hand on walker)  6 minute walk test: 482' 10 meter walk test: 0.65 m/s Berg Balance Scale: 42/56   PATIENT SURVEYS:  Stroke Impact Scale - 65.63%                                                                                                                             TREATMENT DATE 02/10/24:    -Patient arrives to session in transport chair  -nustep reciprocal movement training x 6 min level 1-3. Cues for improved attention to the RUE to maintain grasp; unable to sustain grip without assist. PT provided R hand splint to improve use of BUE with full ROM on the R shoulder   Gait with SPC through rehab department x 342ft with standing rest break at 145ft; performed x 2 bouts additional gait through rehab gym with no UE support x 167ft to weave through multiple obstacles and equipment through rehab gym.  -Reciprocal foot tap on 6 inch step 2x 10 bil  -Forward/reverse gait with SPC 10 ft x 4 . Cues for improved step length on the RLE    PATIENT EDUCATION: Education details: PT plan of care; Discussion of symptoms of CVA and to go to ED as soon as possible. Purpose of PT for balance, strength, coordination and endurance.  Person educated: Patient Education method: Explanation Education comprehension: verbalized understanding  HOME EXERCISE PROGRAM: Access Code: [email or web address] URL: [email or web address] Date: 12/17/2023 Prepared by: Sidra Simpers  Program Notes **Be sure to perform all exercises next to a countertop or sturdy piece of furniture in case you become unsteady.  Exercises - Standing Heel Raise with Support  - 1 x daily - 7 x weekly - 2 sets - 15 reps - Standing Toe Raises at Chair  - 1 x daily - 7 x weekly - 2 sets - 15 reps - Mini Squat with Counter Support  - 1 x daily - 7 x weekly - 2 sets - 15 reps - Lunge with Counter Support  - 1 x daily - 7 x weekly - 2 sets - 15 reps - Standing Tandem Balance with  Counter Support  - 1 x daily - 7 x weekly - 2 sets - 2 reps - 30 hold - Standing Single Leg Stance with Counter Support  - 1 x daily - 7 x weekly - 2 sets - 2 reps - 30 hold  GOALS: Goals reviewed with patient? Yes  SHORT TERM GOALS: Target date: 01/21/2024  Pt will be independent with HEP in order to improve strength and balance in order to decrease fall risk and improve function at home.  Baseline: EVAL - No current formal HEP in place Goal  status: INITIAL   LONG TERM GOALS: Target date: 03/03/2024  1.  Patient will complete five times sit to stand test in < 15 seconds indicating an increased LE strength and improved balance. Baseline: EVAL= 25.20 sec with LUE Support 9/2: 23.36 without UE support and CGA-min assist. 20.10 sec with LUE support Goal status: INITIAL  2.  Patient will increase Berg Balance score by > 6 points to demonstrate decreased fall risk during functional activities. Baseline: EVAL: To be assessed next visit; 12/12/2023= 42/56 9/2: 44/56  Goal status: INITIAL   3.  Patient will reduce timed up and go to <11 seconds to reduce fall risk and demonstrate improved transfer/gait ability. Baseline: EVAL = 40 sec with RW 9/2: 18.33sec with SPC Goal status: INITIAL  4.  Patient will increase 10 meter walk test to >1.34m/s as to improve gait speed for better community ambulation and to reduce fall risk. Baseline: EVAL: 0.65 m/s 9/2: 0.67 m/s with [facility] 9/9: 0.71 m/s without an AD and wearing 2.5 # AW Goal status: INITIAL  5. Patient will increase six minute walk test distance by 200 feet for progression to community ambulator and improve gait ability Baseline: EVAL= 482' 9/2: 465 feet  using a SPC with CGA required seated rest break at 4:76min  Goal status: INITIAL   ASSESSMENT:  CLINICAL IMPRESSION:  Pt performed gait training and is once again self limiting to 2 laps and requesting standing rest break on each bout after 131ft. Performed continued dynamic mobility  training with forward/reverse gait and gait without UE support including navigation through obstacles in rehab gym. Requesting multiple breaks between bouts of all interventions.  Pt will continue to benefit from skilled therapy to address remaining deficits in order to improve overall QoL and return to PLOF.     OBJECTIVE IMPAIRMENTS: Abnormal gait, decreased activity tolerance, decreased balance, decreased coordination, decreased endurance, decreased mobility, difficulty walking, decreased ROM, decreased strength, impaired UE functional use, and pain.   ACTIVITY LIMITATIONS: carrying, lifting, bending, sitting, standing, squatting, sleeping, stairs, and transfers  PARTICIPATION LIMITATIONS: meal prep, cleaning, laundry, driving, shopping, community activity, and yard work  PERSONAL FACTORS: 1-2 comorbidities: OA, previous CVA with significant R sided weakness are also affecting patient's functional outcome.   REHAB POTENTIAL: Good  CLINICAL DECISION MAKING: Stable/uncomplicated  EVALUATION COMPLEXITY: Moderate  PLAN:  PT FREQUENCY: 1-2x/week  PT DURATION: 12 weeks  PLANNED INTERVENTIONS: [id number]- PT Re-evaluation, 97750- Physical Performance Testing, 97110-Therapeutic exercises, 97530- Therapeutic activity, [id number]- Neuromuscular re-education, 97535- Self Care, [id number]- Manual therapy, [id number]- Gait training, [phone]- Orthotic Initial, [phone]- Orthotic/Prosthetic subsequent, [phone]- Canalith repositioning, [phone]- Electrical stimulation (manual), [phone] (1-2 muscles), [phone] (3+ muscles)- Dry Needling, Patient/Family education, Balance training, Stair training, Taping, Joint mobilization, Joint manipulation, Spinal manipulation, Spinal mobilization, Compression bandaging, Vestibular training, DME instructions, Cryotherapy, and Moist heat  PLAN FOR NEXT SESSION:  Continue  Curb step navigation training  B LE functional strengthening Sit<>stands Step-ups  Gait training with LRAD with increased  activity tolerance through hall of hospital to reduce self limiting behavior.    Massie Dollar PT, DPT  Physical Therapist - [facility] ([place])  2:06 PM 02/10/24

## 2024-02-11 ENCOUNTER — Encounter: Payer: Self-pay | Admitting: Occupational Therapy

## 2024-02-11 NOTE — Therapy (Signed)
 OUTPATIENT OCCUPATIONAL THERAPY NEURO TREATMENT NOTE   Patient Name: Erika Stanton MRN: [id number] DOB:1955/11/13, 68 y.o., female Today's Date: 02/11/2024  PCP: Laurice Anes, MD REFERRING PROVIDER: Laurice Anes, MD   OT End of Session - 02/11/24 1842     Visit Number 16    Number of Visits 24    Date for Recertification  03/03/24    Authorization Time Period Reporting period beginning 01/16/24    OT Start Time 1318    OT Stop Time 1402    OT Time Calculation (min) 44 min    Equipment Utilized During Treatment transport chair    Activity Tolerance Patient tolerated treatment well    Behavior During Therapy WFL for tasks assessed/performed         Past Medical History:  Diagnosis Date   Anatomical narrow angle, bilateral    Aneurysm    Asthma    Cerebral aneurysm    Cerebral vasospasm    Cognitive change    Dysphagia, post-stroke    GERD (gastroesophageal reflux disease)    Hemorrhoids    History of cervical dysplasia    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 no recurrent polyps.  suggest repeat surveillance study 11/2022.     COLONOSCOPY W/ POLYPECTOMY  06/2014   Dr Elder at [facility].  3 adenomatoous polyps, anal fissure.     ESOPHAGOGASTRODUODENOSCOPY (EGD) WITH PROPOFOL  N/A 01/27/2020   Procedure: ESOPHAGOGASTRODUODENOSCOPY (EGD) WITH PROPOFOL ;  Surgeon: Shila Gustav GAILS, MD;  Location: [facility] END[facility]COPY;  Service: Endoscopy;  Laterality: N/A;   INSERTION OF PERMANENT INTRAPERITONEAL CANNULA/CATH N/A    LAPAROSCOPIC   IR 3D INDEPENDENT  [facility]  01/06/2020   IR ANGIO INTRA EXTRACRAN SEL INTERNAL CAROTID BILAT MOD SED  01/06/2020   IR ANGIO VERTEBRAL SEL VERTEBRAL UNI L MOD SED  01/06/2020   IR ANGIOGRAM FOLLOW UP STUDY  01/06/2020   IR ANGIOGRAM FOLLOW UP STUDY  01/06/2020   IR ANGIOGRAM FOLLOW UP STUDY  01/06/2020   IR ANGIOGRAM FOLLOW UP STUDY  01/06/2020   IR ANGIOGRAM FOLLOW UP STUDY  01/06/2020   IR ANGIOGRAM FOLLOW UP STUDY  01/06/2020   IR ANGIOGRAM FOLLOW UP STUDY  01/06/2020   IR ANGIOGRAM FOLLOW UP STUDY  01/06/2020   IR ANGIOGRAM FOLLOW UP STUDY  01/06/2020   IR ANGIOGRAM FOLLOW UP STUDY  01/06/2020   IR NEURO EACH ADD'L AFTER BASIC UNI RIGHT (MS)  01/06/2020   IR TRANSCATH/EMBOLIZ  01/06/2020   MASTECTOMY     MOHS SURGERY N/A    RADIOLOGY WITH ANESTHESIA N/A 01/06/2020   Procedure: IR WITH ANESTHESIA FOR ANEURYSM;  Surgeon: Lanis Pupa, MD;  Location: [facility] OR;  Service: Radiology;  Laterality: N/A;   STENT SUPPORTED EMBOLIZATION OF RIGHT ICA ANEURYSM Right    TUBAL LIGATION     VENTRICULO-PERITONEAL SHUNT PLACEMENT / LAPAROSCOPIC INSERTION PERITONEAL CATHETER N/A    Patient Active Problem List   Diagnosis Date Noted   Dyspareunia in female 04/05/2020  History of abnormal cervical Pap smear 03/10/2020   GIB (gastrointestinal bleeding) 02/28/2020   Elevated BUN    Prediabetes    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   Cerebral vasospasm    Sinus tachycardia    Dysphagia, post-stroke    Thrombocytopenia    Acute blood loss anemia    Brain aneurysm    ICH (intracerebral hemorrhage) (HCC) 01/05/2020   History of adenomatous polyp of colon 02/07/2016   Anatomical narrow angle, bilateral 12/14/2014   Fissure in ano 11/11/2014   Hemorrhoids 11/11/2014   Constipation 11/19/2013   GERD (gastroesophageal reflux disease) 03/24/2013   Irritable bowel syndrome with diarrhea 03/24/2013   Herpes zoster 05/14/2005   Abnormal Pap smear of cervix 05/15/1983   History of cervical dysplasia 05/15/1983    ONSET DATE: 12/02/2023  REFERRING DIAG: L ischemic CVA  THERAPY DIAG:  Muscle weakness (generalized)  Other lack of coordination  Rationale for Evaluation and Treatment: Rehabilitation  SUBJECTIVE:  SUBJECTIVE STATEMENT: Pt reports continued challenges with opening toothpaste and other containers. Pt accompanied by: self  PERTINENT HISTORY:  Pt. Was admitted to [facility] from 12/02/23-12/06/23 with an Acute Right MCA CVA, Hydrocephalus with shunt  placement. History of Subarachnoid Hemorrhage with bilateral ICA coiled aneurysms. 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  PRECAUTIONS: None  WEIGHT BEARING RESTRICTIONS: No  PAIN:  02/05/24: 4/10 bilat knees Are you having pain? No  FALLS: Has patient fallen in last 6 months? Yes. Number of falls 1  LIVING ENVIRONMENT: Lives with: lives with their family and lives with their spouse Lives in: House/apartment Stairs: hand rails, 2 steps   Has following equipment at home: [name], cane  PLOF: Independent with basic ADLs, Spouse help as needed  PATIENT GOALS: Strengthening  OBJECTIVE:  Note: Objective measures were completed at Evaluation unless otherwise noted.  HAND DOMINANCE: Left  ADLs: Overall ADLs: spouse assists when needed, Pt. reports that she can do most tasks herself. Uses her left hand to engage in self-care tasks. Eating: Able to cut foods with a fork, husband assists as needed.  Grooming: husband assists with applying toothpaste, able to brush teeth and hair UB Dressing: Spouse assists with applying/fastening bra, Pt. Requires assistance with zippers, and buttons. LB Dressing: Spouse assists with tying shoes when needed, uses slip on shoes. Pt.  Does not zip pants. Toileting: Able to complete independently Bathing: Pt. Requires intermittent assistance with bathing tasks. Tub Shower transfers: Spouse assists with transfers when/if needed Equipment: Shower seat with back  IADLs: Shopping: Spouse typically does the shopping, isnt able to carry heavy objects.  Light housekeeping: Spouse typically does most of the cleaning around the home, Pt. Cleans bathroom, and laundry Pt. Is unable to carry laundry due to exacerbation of weakness.  Meal Prep: Spouse typically cooks and prepares meals.  Community mobility: Pt. Requires assistance using walker. Pt. reports that she is unable to carry heavy items, and typically does not do the shopping.  Medication management: Pt. is able to take care of meds with pillbox Financial management:  Spouse takes care of it, and makes online payments.  Handwriting: 50% legible  MOBILITY STATUS: Needs Assist: Pt. Requires use of a walker   POSTURE COMMENTS:  No Significant postural limitations Sitting balance: Good  ACTIVITY TOLERANCE: Activity tolerance:  FUNCTIONAL OUTCOME MEASURES: TBD  UPPER EXTREMITY ROM:    Active ROM Right eval Right 01/16/24 Left eval Left 01/16/24  Shoulder flexion 94(121) 100 with scaption (132) 102(125) 134 with scaption (145)  Shoulder abduction 75(106) 74 (98) 110(126) 140 (170)  Shoulder adduction      Shoulder extension      Shoulder internal rotation      Shoulder external rotation      Elbow flexion 135(155)  140(150)   Elbow extension 0  -10(-9)   Wrist flexion 40(44)  42(58)   Wrist extension 30(34)  40(50)   Wrist ulnar deviation      Wrist radial deviation      Wrist pronation      Wrist supination      (Blank rows = not tested)  UPPER EXTREMITY MMT:     MMT Right eval Left eval  Shoulder flexion 3-/5 3-/5  Shoulder abduction 3-/5 3-/5  Shoulder adduction    Shoulder extension    Shoulder internal rotation    Shoulder external rotation     Middle trapezius    Lower trapezius    Elbow flexion 4-/5 4+/5  Elbow extension 4-/5 4+/5  Wrist flexion 4-/5 3+/5  Wrist extension 4-/5 3+/5  Wrist ulnar deviation    Wrist radial deviation    Wrist pronation    Wrist supination    (Blank rows = not tested)  HAND FUNCTION: Grip strength: Right: N/T lbs; Left: 43 lbs, Lateral pinch: Right: N/T lbs, Left: 9 lbs, and 3 point pinch: Right: N/T lbs, Left: 5 lbs Pt. Presents with flexor tightness in the right 5th digit, however is able to passively extend 5th digit within normal range. Pt. Also Presents with hyper extension in the 2nd and 3rd digit at PIP joints. Pt. Is able to achieve full Digit MP, PIP, and DIP PROM.  01/16/24: Grip strength: Left: 33 lbs; Lateral pinching: Left: 8 lbs; 3 point pinch: Left: 3 lbs  COORDINATION: 9 Hole Peg test: Right: N/T sec; Left: 51 sec 01/16/24: Left: 1 min 1 sec  SENSATION: Not tested  EDEMA:   MUSCLE TONE: Fluctuating flexor tone in R hand.  COGNITION: Overall cognitive status: Within functional limits for tasks assessed  VISION: Subjective report:  Baseline vision:  Visual history:   VISION ASSESSMENT:  PERCEPTION:   PRAXIS:   OBSERVATIONS: Pt. presents with fluctuating flexor tone in the R hand, involving the 5th digit at the PIP/DIP joint. Pt. Presents with hyperextension at the 2nd and 3rd digits at the PIP joints.                                                                                                                    TREATMENT DATE: 02/10/24  Pt reports she is doing well, has been doing some range of motion of her hands at home, occasionally using putty.    Moist heat applied to R hand used intermittently throughout OT session to promote reduced spasticity/relax R hand for optimal positioning  Therapeutic  Exercise: Pt seen this date with focus on left hand strengthening with the use of push pins and moderate resistance board.  Pt demonstrates difficulty with pushing  pins into board without use of predrilled holes.  She is able to place into predrilled holes but demonstrates difficulty with pushing pins all the way, flush with the board.  Cues for prehension patterns for tip to tip pinch on left.    Neuromuscular Reeducation:  Pt seen for fine motor coordination skills with use of grooved pegboard, cues for turning and manipulation of pegs to place into board.  Increased time required to complete task.  Moderate difficulty with task.  Able to remove pegs once they are placed with greater ease.   Therapeutic Activities: Pt seen for multi directional reaching from seated position to place checkers into board on elevated plane.  Cues for tip to tip prehension, alternating patterns and attempting to use hand for storage.     PATIENT EDUCATION: Education details: ROM, positioning of hand, finger extension Person educated: Patient Education method: Explanation, Demonstration, and Verbal cues Education comprehension: verbalized understanding, returned demonstration, verbal cues required, and needs further education  HOME EXERCISE PROGRAM: -Pink theraputty; visual handout issued -Self and caregiver assisted PROM to R hand and wrist   GOALS: Goals reviewed with patient? Yes  SHORT TERM GOALS: Target date: 01/21/2024    Pt. Will be independent with HEP for UE functioning. Baseline: Eval:No current HEP; 01/16/24: pt reports doing a little writing and squeezing a ball, but denies using putty; spouse stretches RUE daily Goal status: in progress  LONG TERM GOALS: Target date: 03/03/2024    Pt. Will improve [facility] skills to be able to be able to manipulate small objects at home Baseline: 9 hole peg test: L: 51 seconds; 01/16/24: L 1 min 1 sec Goal status: in progress  2.  Pt. Will increase BUE shoulder ROM by 10 degrees to be able to reach into cabinets and shelves.  Baseline: Shoulder flexion: R: 94(121), L: 102(125), Shoulder abduction: R: 75(106), L: 110(126);  01/16/24: Shoulder flexion: R: 100 with scaption (132), L: 134 with scaption (145), Shoulder abduction: R: 74 (98), L: 140 (170) Goal status: in progress  3.  Pt. Will improve L grip strength by 5# to be able to securely grasp ADL items at home.  Baseline: Grip strength: R: NT, L: 43#; 01/16/24: L: 33# Goal status: in progress  4.  Pt. Will increase L lateral key pinch strength by 3#  to open wide mouth jars.  Baseline: Lateral key pinch: R: NT, L: 9#; 01/16/24: L: 8# Goal status: in progress  5.  Pt. Will independently engage the right hand as a gross assist to the left hand 100% of the time during ADLs/IADLs. Baseline: Eval: Pt. With limited engagement of the right hand as a gross assist during tasks; 01/16/24: Observed <25% of the time during OT sessions  Goal status: in progress  6.  Pt. will improve left hand [facility] skills by 3 sec. of speed to be able to manipulate zippers, and buttons efficiently. Baseline: Eval:Right: NT Left: 51 sec; 01/16/24: Left: 1 min and 1 sec  Goal status: in progress  7. Pt. Will improve handwriting legibility to 100% in printed form for written correspondence.  Baseline: 50% legible in printed form for first name only; 01/16/24: 80-90% legible to print first name 1 of 4 attempts.  3 of 4 attempts 50% or less legible Goal status: in progress  ASSESSMENT: CLINICAL IMPRESSION: Encouraged pt to  work towards consistency with exercises on a daily basis, potentially performing exercises during commercial breaks of her favorite shows.  Pt continues to demonstrate decreased strength and coordination of left hand.  She responds to verbal and tactile cues but requires frequent cuing for consistency with movement patterns.  Pt demonstrates difficulty with translatory skills of the left hand and using the hand for storage.  Continue to work towards goals in plan of care to maximize safety and independence in necessary daily tasks.    PERFORMANCE DEFICITS: in functional skills  including ADLs, IADLs, coordination, dexterity, edema, tone, ROM, strength, pain, Fine motor control, Gross motor control, endurance, and UE functional use, and psychosocial skills including coping strategies, environmental adaptation, habits, and routines and behaviors.   IMPAIRMENTS: are limiting patient from ADLs, IADLs, rest and sleep, leisure, and social participation.   CO-MORBIDITIES: may have co-morbidities  that affects occupational performance. Patient will benefit from skilled OT to address above impairments and improve overall function.  MODIFICATION OR ASSISTANCE TO COMPLETE EVALUATION: Min-Moderate modification of tasks or assist with assess necessary to complete an evaluation.  OT OCCUPATIONAL PROFILE AND HISTORY: Detailed assessment: Review of records and additional review of physical, cognitive, psychosocial history related to current functional performance.  CLINICAL DECISION MAKING: Moderate - several treatment options, min-mod task modification necessary  REHAB POTENTIAL: Good  EVALUATION COMPLEXITY: Moderate    PLAN:  OT FREQUENCY: 2x/week  OT DURATION: 12 weeks  PLANNED INTERVENTIONS: 97168 OT Re-evaluation, 97535 self care/ADL training, 02889 therapeutic exercise, 97530 therapeutic activity, 97112 neuromuscular re-education, 97140 manual therapy, 97018 paraffin, 02960 fluidotherapy, 97010 moist heat, 97010 cryotherapy, 97034 contrast bath, 97032 electrical stimulation (manual), 97760 Orthotic Initial, passive range of motion, energy conservation, coping strategies training, patient/family education, and DME and/or AE instructions  RECOMMENDED OTHER SERVICES: PT   CONSULTED AND AGREED WITH PLAN OF CARE: Patient  PLAN FOR NEXT SESSION: Treatment    Cindee Mclester T Yuleidy Rappleye, OTR/L, CLT  02/11/2024, 6:44 PM

## 2024-02-12 ENCOUNTER — Ambulatory Visit: Attending: Family Medicine | Admitting: Occupational Therapy

## 2024-02-12 ENCOUNTER — Ambulatory Visit

## 2024-02-12 ENCOUNTER — Ambulatory Visit: Admitting: Physical Therapy

## 2024-02-12 ENCOUNTER — Encounter: Admitting: Speech Pathology

## 2024-02-12 DIAGNOSIS — R41841 Cognitive communication deficit: Secondary | ICD-10-CM | POA: Diagnosis present

## 2024-02-12 DIAGNOSIS — R2689 Other abnormalities of gait and mobility: Secondary | ICD-10-CM

## 2024-02-12 DIAGNOSIS — R278 Other lack of coordination: Secondary | ICD-10-CM | POA: Insufficient documentation

## 2024-02-12 DIAGNOSIS — R296 Repeated falls: Secondary | ICD-10-CM

## 2024-02-12 DIAGNOSIS — R269 Unspecified abnormalities of gait and mobility: Secondary | ICD-10-CM | POA: Insufficient documentation

## 2024-02-12 DIAGNOSIS — R262 Difficulty in walking, not elsewhere classified: Secondary | ICD-10-CM

## 2024-02-12 DIAGNOSIS — M6281 Muscle weakness (generalized): Secondary | ICD-10-CM | POA: Diagnosis present

## 2024-02-12 NOTE — Therapy (Unsigned)
 OUTPATIENT PHYSICAL THERAPY TREATMENT   Patient Name: Erika Stanton MRN: [id number] DOB:[birth year], 68 y.o., female Today's Date: 02/13/2024  PCP: Dr. Oneil Galloway  REFERRING PROVIDER: Dr. Oneil Galloway  END OF SESSION:  PT End of Session - 02/12/24 1458     Visit Number 18    Number of Visits 24    Date for Recertification  03/03/24    Authorization Type [employer]    Progress Note Due on Visit 20    PT Start Time 1445    PT Stop Time 1528    PT Time Calculation (min) 43 min    Equipment Utilized During Treatment Gait belt    Activity Tolerance Patient tolerated treatment well;No increased pain    Behavior During Therapy WFL for tasks assessed/performed           Past Medical History:  Diagnosis Date   Anatomical narrow angle, bilateral    Aneurysm    Asthma    Cerebral aneurysm    Cerebral vasospasm    Cognitive change    Dysphagia, post-stroke    GERD (gastroesophageal reflux disease)    Hemorrhoids    History of cervical dysplasia    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 no recurrent polyps.  suggest repeat surveillance study 11/2022.     COLONOSCOPY W/ POLYPECTOMY  06/2014   Dr Elder at [facility].  3 adenomatoous polyps, anal fissure.     ESOPHAGOGASTRODUODENOSCOPY (EGD) WITH PROPOFOL  N/A 01/27/2020   Procedure: ESOPHAGOGASTRODUODENOSCOPY (EGD) WITH PROPOFOL ;  Surgeon: Shila Gustav GAILS, MD;  Location: [facility] ENDOSCOPY;  Service: Endoscopy;  Laterality: N/A;   INSERTION OF PERMANENT INTRAPERITONEAL CANNULA/CATH N/A     LAPAROSCOPIC   IR 3D INDEPENDENT WKST  01/06/2020   IR ANGIO INTRA EXTRACRAN SEL INTERNAL CAROTID BILAT MOD SED  01/06/2020   IR ANGIO VERTEBRAL SEL VERTEBRAL UNI L MOD SED  01/06/2020   IR ANGIOGRAM FOLLOW UP STUDY  01/06/2020   IR ANGIOGRAM FOLLOW UP STUDY  01/06/2020   IR ANGIOGRAM FOLLOW UP STUDY  01/06/2020   IR ANGIOGRAM FOLLOW UP STUDY  01/06/2020   IR ANGIOGRAM FOLLOW UP STUDY  01/06/2020   IR ANGIOGRAM FOLLOW UP STUDY  01/06/2020   IR ANGIOGRAM FOLLOW UP STUDY  01/06/2020   IR ANGIOGRAM FOLLOW UP STUDY  01/06/2020   IR ANGIOGRAM FOLLOW UP STUDY  01/06/2020   IR ANGIOGRAM FOLLOW UP STUDY  01/06/2020   IR NEURO EACH ADD'L AFTER BASIC UNI RIGHT (MS)  01/06/2020   IR TRANSCATH/EMBOLIZ  01/06/2020   MASTECTOMY     MOHS SURGERY N/A    RADIOLOGY WITH ANESTHESIA N/A 01/06/2020   Procedure: IR WITH ANESTHESIA FOR ANEURYSM;  Surgeon: Lanis Pupa, MD;  Location: [facility] OR;  Service: Radiology;  Laterality: N/A;   STENT SUPPORTED EMBOLIZATION OF RIGHT ICA ANEURYSM Right    TUBAL LIGATION     VENTRICULO-PERITONEAL SHUNT PLACEMENT / LAPAROSCOPIC INSERTION PERITONEAL CATHETER N/A    Patient Active Problem List  Diagnosis Date Noted   Dyspareunia in female 04/05/2020   History of abnormal cervical Pap smear 03/10/2020   GIB (gastrointestinal bleeding) 02/28/2020   Elevated BUN    Prediabetes    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   Cerebral vasospasm    Sinus tachycardia    Dysphagia, post-stroke    Thrombocytopenia    Acute blood loss anemia    Brain aneurysm    ICH (intracerebral hemorrhage) (HCC) 01/05/2020   History of adenomatous polyp of colon 02/07/2016   Anatomical narrow angle, bilateral 12/14/2014   Fissure in ano 11/11/2014   Hemorrhoids 11/11/2014   Constipation 11/19/2013   GERD (gastroesophageal reflux disease) 03/24/2013   Irritable bowel syndrome with diarrhea 03/24/2013   Herpes zoster 05/14/2005   Abnormal Pap smear of cervix 05/15/1983   History  of cervical dysplasia 05/15/1983    ONSET DATE: 12/02/2023  REFERRING DIAG: I63.9 (ICD-10-CM) - CVA (cerebral vascular accident) (HCC)   THERAPY DIAG:  Muscle weakness (generalized)  Other lack of coordination  Abnormality of gait and mobility  Difficulty in walking, not elsewhere classified  Repeated falls  Other abnormalities of gait and mobility  Cognitive communication deficit  Rationale for Evaluation and Treatment: Rehabilitation  SUBJECTIVE:                                                                                                                                                                                             SUBJECTIVE STATEMENT:   Pt reports feeling okay- denies any falls and states Knee soreness is about the same.   PERTINENT HISTORY:  Patient reports she was in hospital from [facility]- Sat (July 21st- 25th) with CVA affecting left side. I was supposed to have a knee replacement surgery in September but that is on hold for now. I was doing my prep for my colonoscopy and fell in the bathroom.  Patient reports having mild left sided weakness compared to her CVA in 2021 with heavy Right sided UE weakness. She reports bad OA affecting both knees as well. Previous CVA in 2021 affecting Right side. Reports going to ED  last Monday due to fall/CVA affecting Left side. Diagnosed with R CVA with Left Sided weakness.    PAIN:  Are you having pain? 3/10 bilat knees   PRECAUTIONS: Fall  WEIGHT BEARING RESTRICTIONS: No  FALLS: Has patient fallen in last 6 months? Yes. Number of falls fell with the stroke  PATIENT GOALS: Walk without any assistive device.  OBJECTIVE:  Note: Objective measures were completed at evaluation unless otherwise noted.  LOWER EXTREMITY MMT:    MMT Right Eval Left Eval  Hip  flexion 4 4  Hip internal rotation 2- 2-  Hip external rotation 3+ 3+  Knee flexion 3+ 3+  Knee extension 4 4  Ankle dorsiflexion 4 4  (Blank rows = not  tested)  FUNCTIONAL TESTS:  5 times sit to stand: 25.20 sec with LUE Support Timed up and go (TUG): 40 sec with RW (increased time to place right hand on walker)  6 minute walk test: 482' 10 meter walk test: 0.65 m/s Berg Balance Scale: 42/56   PATIENT SURVEYS:  Stroke Impact Scale - 65.63%                                                                                                                             TREATMENT DATE 02/12/24:    -Patient arrives to session in transport chair  TA:  -Reciprocal foot tap on 6 inch step 2x 10 bil with 1UE support  Sit to stand with focus on eccentric control - min LUE support on way up but no UE support with eccentric sit. X 15 reps  -Nustep reciprocal movement training x 6 min level 1. Cues for improved attention to the RUE to maintain grasp; unable to sustain grip without assist. PT provided some physical assist to improve use of BUE with full ROM on the R shoulder   -Side stepping at support bar- down and back x 5 with 1UE support.   GAIT TRAINING: Gait with SPC through rehab department x 361ft with standing rest break at 130ft; performed x 2 laps- Reciprocal steps and VC for erect upright posture.    PATIENT EDUCATION: Education details: PT plan of care; Discussion of symptoms of CVA and to go to ED as soon as possible. Purpose of PT for balance, strength, coordination and endurance.  Person educated: Patient Education method: Explanation Education comprehension: verbalized understanding  HOME EXERCISE PROGRAM: Access Code: [email or web address] URL: [email or web address] Date: 12/17/2023 Prepared by: Sidra Simpers  Program Notes **Be sure to perform all exercises next to a countertop or sturdy piece of furniture in case you become unsteady.  Exercises - Standing Heel Raise with Support  - 1 x daily - 7 x weekly - 2 sets - 15 reps - Standing Toe Raises at Chair  - 1 x daily - 7 x weekly - 2 sets - 15 reps - Mini Squat with  Counter Support  - 1 x daily - 7 x weekly - 2 sets - 15 reps - Lunge with Counter Support  - 1 x daily - 7 x weekly - 2 sets - 15 reps - Standing Tandem Balance with Counter Support  - 1 x daily - 7 x weekly - 2 sets - 2 reps - 30 hold - Standing Single Leg Stance with Counter Support  - 1 x daily - 7 x weekly - 2 sets - 2 reps - 30 hold  GOALS: Goals reviewed with patient? Yes  SHORT TERM GOALS: Target date: 01/21/2024  Pt will be independent  with HEP in order to improve strength and balance in order to decrease fall risk and improve function at home.  Baseline: EVAL - No current formal HEP in place Goal status: INITIAL   LONG TERM GOALS: Target date: 03/03/2024  1.  Patient will complete five times sit to stand test in < 15 seconds indicating an increased LE strength and improved balance. Baseline: EVAL= 25.20 sec with LUE Support 9/2: 23.36 without UE support and CGA-min assist. 20.10 sec with LUE support Goal status: INITIAL  2.  Patient will increase Berg Balance score by > 6 points to demonstrate decreased fall risk during functional activities. Baseline: EVAL: To be assessed next visit; 12/12/2023= 42/56 9/2: 44/56  Goal status: INITIAL   3.  Patient will reduce timed up and go to <11 seconds to reduce fall risk and demonstrate improved transfer/gait ability. Baseline: EVAL = 40 sec with RW 9/2: 18.33sec with SPC Goal status: INITIAL  4.  Patient will increase 10 meter walk test to >1.61m/s as to improve gait speed for better community ambulation and to reduce fall risk. Baseline: EVAL: 0.65 m/s 9/2: 0.67 m/s with [facility] 9/9: 0.71 m/s without an AD and wearing 2.5 # AW Goal status: INITIAL  5. Patient will increase six minute walk test distance by 200 feet for progression to community ambulator and improve gait ability Baseline: EVAL= 482' 9/2: 465 feet  using a SPC with CGA required seated rest break at 4:19min  Goal status: INITIAL   ASSESSMENT:  CLINICAL IMPRESSION:   Patient able to demonstrate improving reciprocal steps overall today- still limited by fatigue but more efficient walk vs. Previous sessions. She was also able to keep R hand gripped to Nustep handle with only intermittent adjustment today.  Pt will continue to benefit from skilled therapy to address remaining deficits in order to improve overall QoL and return to PLOF.     OBJECTIVE IMPAIRMENTS: Abnormal gait, decreased activity tolerance, decreased balance, decreased coordination, decreased endurance, decreased mobility, difficulty walking, decreased ROM, decreased strength, impaired UE functional use, and pain.   ACTIVITY LIMITATIONS: carrying, lifting, bending, sitting, standing, squatting, sleeping, stairs, and transfers  PARTICIPATION LIMITATIONS: meal prep, cleaning, laundry, driving, shopping, community activity, and yard work  PERSONAL FACTORS: 1-2 comorbidities: OA, previous CVA with significant R sided weakness are also affecting patient's functional outcome.   REHAB POTENTIAL: Good  CLINICAL DECISION MAKING: Stable/uncomplicated  EVALUATION COMPLEXITY: Moderate  PLAN:  PT FREQUENCY: 1-2x/week  PT DURATION: 12 weeks  PLANNED INTERVENTIONS: 97164- PT Re-evaluation, 97750- Physical Performance Testing, 97110-Therapeutic exercises, 97530- Therapeutic activity, [id number]- Neuromuscular re-education, 97535- Self Care, [id number]- Manual therapy, [id number]- Gait training, [phone]- Orthotic Initial, [phone]- Orthotic/Prosthetic subsequent, [phone]- Canalith repositioning, [phone]- Electrical stimulation (manual), [phone] (1-2 muscles), [id number] (3+ muscles)- Dry Needling, Patient/Family education, Balance training, Stair training, Taping, Joint mobilization, Joint manipulation, Spinal manipulation, Spinal mobilization, Compression bandaging, Vestibular training, DME instructions, Cryotherapy, and Moist heat  PLAN FOR NEXT SESSION:  Continue  Curb step navigation training  B LE functional  strengthening Sit<>stands Step-ups  Gait training with LRAD with increased activity tolerance through hall of hospital to reduce self limiting behavior.    Chyrl London, PT Physical Therapist - [facility]  10:03 PM 02/13/24

## 2024-02-12 NOTE — Therapy (Addendum)
 OUTPATIENT OCCUPATIONAL THERAPY NEURO TREATMENT NOTE   Patient Name: Erika Stanton MRN: [id number] DOB:[birth year], 68 y.o., female Today's Date: 02/12/2024  PCP: Laurice Anes, MD REFERRING PROVIDER: Laurice Anes, MD   OT End of Session - 02/12/24 1603     Visit Number 17    Number of Visits 24    Date for Recertification  03/03/24    Authorization Time Period Reporting period beginning 01/16/24    OT Start Time 1400    OT Stop Time 1445    OT Time Calculation (min) 45 min    Equipment Utilized During Treatment transport chair    Activity Tolerance Patient tolerated treatment well    Behavior During Therapy WFL for tasks assessed/performed         Past Medical History:  Diagnosis Date   Anatomical narrow angle, bilateral    Aneurysm    Asthma    Cerebral aneurysm    Cerebral vasospasm    Cognitive change    Dysphagia, post-stroke    GERD (gastroesophageal reflux disease)    Hemorrhoids    History of cervical dysplasia    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 no recurrent polyps.  suggest repeat surveillance study 11/2022.     COLONOSCOPY W/ POLYPECTOMY  06/2014   Dr Elder at [facility].  3 adenomatoous polyps, anal fissure.     ESOPHAGOGASTRODUODENOSCOPY (EGD) WITH PROPOFOL  N/A 01/27/2020   Procedure: ESOPHAGOGASTRODUODENOSCOPY (EGD) WITH PROPOFOL ;  Surgeon: Shila Gustav GAILS, MD;  Location: [facility] END[facility]COPY;  Service: Endoscopy;  Laterality: N/A;   INSERTION OF PERMANENT INTRAPERITONEAL CANNULA/CATH N/A    LAPAROSCOPIC   IR 3D INDEPENDENT  WKST  01/06/2020   IR ANGIO INTRA EXTRACRAN SEL INTERNAL CAROTID BILAT MOD SED  01/06/2020   IR ANGIO VERTEBRAL SEL VERTEBRAL UNI L MOD SED  01/06/2020   IR ANGIOGRAM FOLLOW UP STUDY  01/06/2020   IR ANGIOGRAM FOLLOW UP STUDY  01/06/2020   IR ANGIOGRAM FOLLOW UP STUDY  01/06/2020   IR ANGIOGRAM FOLLOW UP STUDY  01/06/2020   IR ANGIOGRAM FOLLOW UP STUDY  01/06/2020   IR ANGIOGRAM FOLLOW UP STUDY  01/06/2020   IR ANGIOGRAM FOLLOW UP STUDY  01/06/2020   IR ANGIOGRAM FOLLOW UP STUDY  01/06/2020   IR ANGIOGRAM FOLLOW UP STUDY  01/06/2020   IR ANGIOGRAM FOLLOW UP STUDY  01/06/2020   IR NEURO EACH ADD'L AFTER BASIC UNI RIGHT (MS)  01/06/2020   IR TRANSCATH/EMBOLIZ  01/06/2020   MASTECTOMY     MOHS SURGERY N/A    RADIOLOGY WITH ANESTHESIA N/A 01/06/2020   Procedure: IR WITH ANESTHESIA FOR ANEURYSM;  Surgeon: Lanis Pupa, MD;  Location: [facility] OR;  Service: Radiology;  Laterality: N/A;   STENT SUPPORTED EMBOLIZATION OF RIGHT ICA ANEURYSM Right    TUBAL LIGATION     VENTRICULO-PERITONEAL SHUNT PLACEMENT / LAPAROSCOPIC INSERTION PERITONEAL CATHETER N/A    Patient Active Problem List   Diagnosis Date Noted   Dyspareunia in female 04/05/2020  History of abnormal cervical Pap smear 03/10/2020   GIB (gastrointestinal bleeding) 02/28/2020   Elevated BUN    Prediabetes    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   Cerebral vasospasm    Sinus tachycardia    Dysphagia, post-stroke    Thrombocytopenia    Acute blood loss anemia    Brain aneurysm    ICH (intracerebral hemorrhage) (HCC) 01/05/2020   History of adenomatous polyp of colon 02/07/2016   Anatomical narrow angle, bilateral 12/14/2014   Fissure in ano 11/11/2014   Hemorrhoids 11/11/2014   Constipation 11/19/2013   GERD (gastroesophageal reflux disease) 03/24/2013   Irritable bowel syndrome with diarrhea 03/24/2013   Herpes zoster 05/14/2005   Abnormal Pap smear of cervix 05/15/1983   History of cervical dysplasia 05/15/1983    ONSET DATE: 12/02/2023  REFERRING DIAG: L ischemic CVA  THERAPY DIAG:  Muscle weakness (generalized)  Rationale for Evaluation and Treatment: Rehabilitation  SUBJECTIVE:  SUBJECTIVE STATEMENT: Pt. reports doing okay today. Pt accompanied by: self  PERTINENT HISTORY:  Pt. Was admitted to [facility] from 12/02/23-12/06/23 with an Acute Right MCA CVA, Hydrocephalus with shunt  placement. History of Subarachnoid Hemorrhage with bilateral ICA coiled aneurysms. 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  PRECAUTIONS: None  WEIGHT BEARING RESTRICTIONS: No  PAIN:  02/05/24: 4/10 bilat knees Are you having pain? No  FALLS: Has patient fallen in last 6 months? Yes. Number of falls 1  LIVING ENVIRONMENT: Lives with: lives with their family and lives with their spouse Lives in: House/apartment Stairs: hand rails, 2 steps   Has following equipment at home: [name], cane  PLOF: Independent with basic ADLs, Spouse help as needed  PATIENT GOALS: Strengthening  OBJECTIVE:  Note: Objective measures were completed at Evaluation unless otherwise noted.  HAND DOMINANCE: Left  ADLs: Overall ADLs: spouse assists when needed, Pt. reports that she can do most tasks herself. Uses her left hand to engage in self-care tasks. Eating: Able to cut foods with a fork, husband assists as needed.  Grooming: husband assists with applying toothpaste, able to brush teeth and hair UB Dressing: Spouse assists with applying/fastening bra, Pt. Requires assistance with zippers, and buttons. LB Dressing: Spouse assists with tying shoes when needed, uses slip on shoes. Pt. Does not zip pants. Toileting: Able to complete independently Bathing: Pt.  Requires intermittent assistance with bathing tasks. Tub Shower transfers: Spouse assists with transfers when/if needed Equipment: Shower seat with back  IADLs: Shopping: Spouse typically does the shopping, isnt able to carry heavy objects.  Light housekeeping: Spouse typically does most of the cleaning around the home, Pt. Cleans bathroom, and laundry Pt. Is unable to carry laundry due to exacerbation of weakness.  Meal Prep: Spouse typically cooks and prepares meals.  Community mobility: Pt. Requires assistance using walker. Pt. reports that she is unable to carry heavy items, and typically does not do the shopping.  Medication management: Pt. is able to take care of meds with pillbox Financial management:  Spouse takes care of it, and makes online payments.  Handwriting: 50% legible  MOBILITY STATUS: Needs Assist: Pt. Requires use of a walker   POSTURE COMMENTS:  No Significant postural limitations Sitting balance: Good  ACTIVITY TOLERANCE: Activity tolerance:   FUNCTIONAL OUTCOME MEASURES: TBD  UPPER EXTREMITY ROM:  Active ROM Right eval Right 01/16/24 Left eval Left 01/16/24  Shoulder flexion 94(121) 100 with scaption (132) 102(125) 134 with scaption (145)  Shoulder abduction 75(106) 74 (98) 110(126) 140 (170)  Shoulder adduction      Shoulder extension      Shoulder internal rotation      Shoulder external rotation      Elbow flexion 135(155)  140(150)   Elbow extension 0  -10(-9)   Wrist flexion 40(44)  42(58)   Wrist extension 30(34)  40(50)   Wrist ulnar deviation      Wrist radial deviation      Wrist pronation      Wrist supination      (Blank rows = not tested)  UPPER EXTREMITY MMT:     MMT Right eval Left eval  Shoulder flexion 3-/5 3-/5  Shoulder abduction 3-/5 3-/5  Shoulder adduction    Shoulder extension    Shoulder internal rotation    Shoulder external rotation    Middle trapezius    Lower trapezius    Elbow flexion 4-/5 4+/5  Elbow  extension 4-/5 4+/5  Wrist flexion 4-/5 3+/5  Wrist extension 4-/5 3+/5  Wrist ulnar deviation    Wrist radial deviation    Wrist pronation    Wrist supination    (Blank rows = not tested)  HAND FUNCTION: Grip strength: Right: N/T lbs; Left: 43 lbs, Lateral pinch: Right: N/T lbs, Left: 9 lbs, and 3 point pinch: Right: N/T lbs, Left: 5 lbs Pt. Presents with flexor tightness in the right 5th digit, however is able to passively extend 5th digit within normal range. Pt. Also Presents with hyper extension in the 2nd and 3rd digit at PIP joints. Pt. Is able to achieve full Digit MP, PIP, and DIP PROM.  01/16/24: Grip strength: Left: 33 lbs; Lateral pinching: Left: 8 lbs; 3 point pinch: Left: 3 lbs  COORDINATION: 9 Hole Peg test: Right: N/T sec; Left: 51 sec 01/16/24: Left: 1 min 1 sec  SENSATION: Not tested  EDEMA:   MUSCLE TONE: Fluctuating flexor tone in R hand.  COGNITION: Overall cognitive status: Within functional limits for tasks assessed  VISION: Subjective report:  Baseline vision:  Visual history:   VISION ASSESSMENT:  PERCEPTION:   PRAXIS:   OBSERVATIONS: Pt. presents with fluctuating flexor tone in the R hand, involving the 5th digit at the PIP/DIP joint. Pt. Presents with hyperextension at the 2nd and 3rd digits at the PIP joints.                                                                                                                    TREATMENT DATE: 02/12/24  Moist heat applied to R hand used intermittently throughout OT session to promote reduced spasticity/relax R hand for optimal positioning  Therapeutic Ex.:    -Pt. tolerated right hand PROM/AAROM through all joint ranges.    Therapeutic Activities:    -facilitated lateral, and 3pt. pinch strengthening using yellow, red, green, and blue level resistive  clips starting with the left hand with cue  in combination with bilateral motor control skills transferring them from her left hand hand to the right  hand, then reaching out  to the far right to discard them into a container placed at the tabletop. Pt. Continues to require step-by-step cues, and cues for hand clip position.   PATIENT EDUCATION: Education details: ROM, positioning of hand, finger extension Person educated: Patient Education method: Explanation, Demonstration, and Verbal cues Education comprehension: verbalized understanding, returned demonstration, verbal cues required, and needs further education  HOME EXERCISE PROGRAM: -Pink theraputty; visual handout issued -Self and caregiver assisted PROM to R hand and wrist -Monitor splint with skin checks   GOALS: Goals reviewed with patient? Yes  SHORT TERM GOALS: Target date: 01/21/2024    Pt. Will be independent with HEP for UE functioning. Baseline: Eval: No current HEP; 01/16/24: pt reports doing a little writing and squeezing a ball, but denies using putty; spouse stretches RUE daily Goal status: in progress  LONG TERM GOALS: Target date: 03/03/2024    Pt. Will improve [facility] skills to be able to be able to manipulate small objects at home Baseline: 9 hole peg test: L: 51 seconds; 01/16/24: L 1 min 1 sec Goal status: in progress  2.  Pt. Will increase BUE shoulder ROM by 10 degrees to be able to reach into cabinets and shelves.  Baseline: Shoulder flexion: R: 94(121), L: 102(125), Shoulder abduction: R: 75(106), L: 110(126); 01/16/24: Shoulder flexion: R: 100 with scaption (132), L: 134 with scaption (145), Shoulder abduction: R: 74 (98), L: 140 (170) Goal status: in progress  3.  Pt. Will improve L grip strength by 5# to be able to securely grasp ADL items at home.  Baseline: Grip strength: R: NT, L: 43#; 01/16/24: L: 33# Goal status: in progress  4.  Pt. Will increase L lateral key pinch strength by 3#  to open wide mouth jars.  Baseline: Lateral key pinch: R: NT, L: 9#; 01/16/24: L: 8# Goal status: in progress  5.  Pt. Will independently engage the right hand as a gross  assist to the left hand 100% of the time during ADLs/IADLs. Baseline: Eval: Pt. With limited engagement of the right hand as a gross assist during tasks; 01/16/24: Observed <25% of the time during OT sessions  Goal status: in progress  6.  Pt. will improve left hand [facility] skills by 3 sec. of speed to be able to manipulate zippers, and buttons efficiently. Baseline: Eval:Right: NT Left: 51 sec; 01/16/24: Left: 1 min and 1 sec  Goal status: in progress  7. Pt. Will improve handwriting legibility to 100% in printed form for written correspondence.  Baseline: 50% legible in printed form for first name only; 01/16/24: 80-90% legible to print first name 1 of 4 attempts.  3 of 4 attempts 50% or less legible Goal status: in progress  ASSESSMENT: CLINICAL IMPRESSION:  Upon arrival, Pt. presented with areas of redness on her 2nd, and 5th MCP joints which Pt. attributes to the hand splint straps. Pt. was encouraged to monitor the areas, and splint straps to maintain good skin integrity. Pt. tolerated moist heat to the right shoulder, and left hand prior to gentle ROM. Pt. reports less pain today with no reports of pain in the right hand. Pt. continues to require step-by-step cues, and assist for hand position, and movement patterns. Pt. requires cues, and encouragement to engage her right hand in more daily tasks. Pt. continues to work towards  goals in plan of care to maximize safety and independence in necessary daily tasks.    PERFORMANCE DEFICITS: in functional skills including ADLs, IADLs, coordination, dexterity, edema, tone, ROM, strength, pain, Fine motor control, Gross motor control, endurance, and UE functional use, and psychosocial skills including coping strategies, environmental adaptation, habits, and routines and behaviors.   IMPAIRMENTS: are limiting patient from ADLs, IADLs, rest and sleep, leisure, and social participation.   CO-MORBIDITIES: may have co-morbidities  that affects occupational  performance. Patient will benefit from skilled OT to address above impairments and improve overall function.  MODIFICATION OR ASSISTANCE TO COMPLETE EVALUATION: Min-Moderate modification of tasks or assist with assess necessary to complete an evaluation.  OT OCCUPATIONAL PROFILE AND HISTORY: Detailed assessment: Review of records and additional review of physical, cognitive, psychosocial history related to current functional performance.  CLINICAL DECISION MAKING: Moderate - several treatment options, min-mod task modification necessary  REHAB POTENTIAL: Good  EVALUATION COMPLEXITY: Moderate    PLAN:  OT FREQUENCY: 2x/week  OT DURATION: 12 weeks  PLANNED INTERVENTIONS: 97168 OT Re-evaluation, 97535 self care/ADL training, 02889 therapeutic exercise, 97530 therapeutic activity, 97112 neuromuscular re-education, 97140 manual therapy, 97018 paraffin, 02960 fluidotherapy, 97010 moist heat, 97010 cryotherapy, 97034 contrast bath, 97032 electrical stimulation (manual), 97760 Orthotic Initial, passive range of motion, energy conservation, coping strategies training, patient/family education, and DME and/or AE instructions  RECOMMENDED OTHER SERVICES: PT   CONSULTED AND AGREED WITH PLAN OF CARE: Patient  PLAN FOR NEXT SESSION: Treatment  Richardson Otter, MS, OTR/L   02/12/2024, 4:07 PM

## 2024-02-17 ENCOUNTER — Encounter: Admitting: Speech Pathology

## 2024-02-17 ENCOUNTER — Ambulatory Visit

## 2024-02-17 ENCOUNTER — Encounter

## 2024-02-18 ENCOUNTER — Ambulatory Visit: Admitting: Physical Therapy

## 2024-02-18 ENCOUNTER — Ambulatory Visit: Admitting: Occupational Therapy

## 2024-02-18 DIAGNOSIS — R2689 Other abnormalities of gait and mobility: Secondary | ICD-10-CM

## 2024-02-18 DIAGNOSIS — M6281 Muscle weakness (generalized): Secondary | ICD-10-CM

## 2024-02-18 DIAGNOSIS — R296 Repeated falls: Secondary | ICD-10-CM

## 2024-02-18 DIAGNOSIS — R262 Difficulty in walking, not elsewhere classified: Secondary | ICD-10-CM

## 2024-02-18 DIAGNOSIS — R269 Unspecified abnormalities of gait and mobility: Secondary | ICD-10-CM

## 2024-02-18 DIAGNOSIS — R278 Other lack of coordination: Secondary | ICD-10-CM

## 2024-02-18 NOTE — Therapy (Signed)
 OUTPATIENT PHYSICAL THERAPY TREATMENT   Patient Name: Erika Stanton MRN: [id number] DOB:06-17-1955, 68 y.o., female Today's Date: 02/18/2024  PCP: Dr. Oneil Galloway  REFERRING PROVIDER: Dr. Oneil Galloway  END OF SESSION:   PT End of Session - 02/18/24 1618     Visit Number 19    Number of Visits 24    Date for Recertification  03/03/24    Authorization Type [employer]    Progress Note Due on Visit 20    PT Start Time 1620    PT Stop Time 1659    PT Time Calculation (min) 39 min    Equipment Utilized During Treatment Gait belt    Activity Tolerance Patient tolerated treatment well;No increased pain    Behavior During Therapy WFL for tasks assessed/performed            Past Medical History:  Diagnosis Date   Anatomical narrow angle, bilateral    Aneurysm    Asthma    Cerebral aneurysm    Cerebral vasospasm    Cognitive change    Dysphagia, post-stroke    GERD (gastroesophageal reflux disease)    Hemorrhoids    History of cervical dysplasia    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 no recurrent polyps.  suggest repeat surveillance study 11/2022.     COLONOSCOPY W/ POLYPECTOMY  06/2014   Dr Elder at [facility].  3 adenomatoous polyps, anal fissure.     ESOPHAGOGASTRODUODENOSCOPY (EGD) WITH PROPOFOL  N/A 01/27/2020   Procedure: ESOPHAGOGASTRODUODENOSCOPY (EGD) WITH PROPOFOL ;  Surgeon: Shila Gustav GAILS, MD;  Location: [facility] END[facility];  Service: Endoscopy;  Laterality: N/A;   INSERTION OF PERMANENT INTRAPERITONEAL CANNULA/CATH N/A     LAPAROSCOPIC   IR 3D INDEPENDENT WKST  01/06/2020   IR ANGIO INTRA EXTRACRAN SEL INTERNAL CAROTID BILAT MOD SED  01/06/2020   IR ANGIO VERTEBRAL SEL VERTEBRAL UNI L MOD SED  01/06/2020   IR ANGIOGRAM FOLLOW UP STUDY  01/06/2020   IR ANGIOGRAM FOLLOW UP STUDY  01/06/2020   IR ANGIOGRAM FOLLOW UP STUDY  01/06/2020   IR ANGIOGRAM FOLLOW UP STUDY  01/06/2020   IR ANGIOGRAM FOLLOW UP STUDY  01/06/2020   IR ANGIOGRAM FOLLOW UP STUDY  01/06/2020   IR ANGIOGRAM FOLLOW UP STUDY  01/06/2020   IR ANGIOGRAM FOLLOW UP STUDY  01/06/2020   IR ANGIOGRAM FOLLOW UP STUDY  01/06/2020   IR ANGIOGRAM FOLLOW UP STUDY  01/06/2020   IR NEURO EACH ADD'L AFTER BASIC UNI RIGHT (MS)  01/06/2020   IR TRANSCATH/EMBOLIZ  01/06/2020   MASTECTOMY     MOHS SURGERY N/A    RADIOLOGY WITH ANESTHESIA N/A 01/06/2020   Procedure: IR WITH ANESTHESIA FOR ANEURYSM;  Surgeon: Lanis Pupa, MD;  Location: [facility] OR;  Service: Radiology;  Laterality: N/A;   STENT SUPPORTED EMBOLIZATION OF RIGHT ICA ANEURYSM Right    TUBAL LIGATION     VENTRICULO-PERITONEAL SHUNT PLACEMENT / LAPAROSCOPIC INSERTION PERITONEAL CATHETER N/A    Patient Active Problem List  Diagnosis Date Noted   Dyspareunia in female 04/05/2020   History of abnormal cervical Pap smear 03/10/2020   GIB (gastrointestinal bleeding) 02/28/2020   Elevated BUN    Prediabetes    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   Cerebral vasospasm    Sinus tachycardia    Dysphagia, post-stroke    Thrombocytopenia    Acute blood loss anemia    Brain aneurysm    ICH (intracerebral hemorrhage) (HCC) 01/05/2020   History of adenomatous polyp of colon 02/07/2016   Anatomical narrow angle, bilateral 12/14/2014   Fissure in ano 11/11/2014   Hemorrhoids 11/11/2014   Constipation 11/19/2013   GERD (gastroesophageal reflux disease) 03/24/2013   Irritable bowel syndrome with diarrhea 03/24/2013   Herpes zoster 05/14/2005   Abnormal Pap smear of cervix 05/15/1983   History  of cervical dysplasia 05/15/1983    ONSET DATE: 12/02/2023  REFERRING DIAG: I63.9 (ICD-10-CM) - CVA (cerebral vascular accident) (HCC)   THERAPY DIAG:  Muscle weakness (generalized)  Other lack of coordination  Abnormality of gait and mobility  Difficulty in walking, not elsewhere classified  Repeated falls  Other abnormalities of gait and mobility  Rationale for Evaluation and Treatment: Rehabilitation  SUBJECTIVE:                                                                                                                                                                                             SUBJECTIVE STATEMENT:  Pt reports she is doing okay today, some knee pain/soreness reported. Pt denies any falls or stumbles.   PERTINENT HISTORY:  Patient reports she was in hospital from [facility]- Sat (July 21st- 25th) with CVA affecting left side. I was supposed to have a knee replacement surgery in September but that is on hold for now. I was doing my prep for my colonoscopy and fell in the bathroom.  Patient reports having mild left sided weakness compared to her CVA in 2021 with heavy Right sided UE weakness. She reports bad OA affecting both knees as well. Previous CVA in 2021 affecting Right side. Reports going to ED  last Monday due to fall/CVA affecting Left side. Diagnosed with R CVA with Left Sided weakness.    PAIN:  Are you having pain? 3/10 bilat knees   PRECAUTIONS: Fall  WEIGHT BEARING RESTRICTIONS: No  FALLS: Has patient fallen in last 6 months? Yes. Number of falls fell with the stroke  PATIENT GOALS: Walk without any assistive device.  OBJECTIVE:  Note: Objective measures were completed at evaluation unless otherwise noted.  LOWER EXTREMITY MMT:    MMT Right Eval Left Eval  Hip flexion 4 4  Hip internal rotation 2- 2-  Hip external rotation 3+ 3+  Knee flexion 3+ 3+  Knee extension 4 4  Ankle dorsiflexion 4 4  (Blank rows = not tested)  FUNCTIONAL  TESTS:  5 times sit to stand: 25.20 sec with LUE Support Timed up and go (TUG): 40 sec with RW (increased time to place right hand on walker)  6 minute walk test: 482' 10 meter walk test: 0.65 m/s Berg Balance Scale: 42/56   PATIENT SURVEYS:  Stroke Impact Scale - 65.63%                                                                                                                             TREATMENT DATE : 02/18/2024  Unless otherwise noted, CGA provided & gait belt donned.   -Patient arrives to session in transport chair.  Nustep reciprocal movement training x 6 min 40 sec. Rolling hill setting, levels 1-4 Pt needing intermittent assist with R UE to maintain grip; removing R UE ~5 min 30 sec  Gait: 2x75' with SPC adjusting cane height for improved gait mechanics Verbal cueing for use of AD in L UE, reciprocal swing through of AD when stepping with R LE, decreased lateral lean   2 rounds of following:  STS x10 with focus on eccentric control Min LUE support for push off, no UE support for eccentric portion Verbal cueing for continuation of activity Gait 150' with [facility] Cont verbal cueing for use of AD in L UE, reciprocal swing through of AD when stepping with R LE, decreased lateral lean Patient transported via transport chair to front entrance.     PATIENT EDUCATION: Education details: PT plan of care; Discussion of symptoms of CVA and to go to ED as soon as possible. Purpose of PT for balance, strength, coordination and endurance.  Person educated: Patient Education method: Explanation Education comprehension: verbalized understanding  HOME EXERCISE PROGRAM: Access Code: [email or web address] URL: [email or web address] Date: 12/17/2023 Prepared by: Sidra Simpers  Program Notes **Be sure to perform all exercises next to a countertop or sturdy piece of furniture in case you become unsteady.  Exercises - Standing Heel Raise with Support  - 1 x daily - 7 x weekly - 2  sets - 15 reps - Standing Toe Raises at Chair  - 1 x daily - 7 x weekly - 2 sets - 15 reps - Mini Squat with Counter Support  - 1 x daily - 7 x weekly - 2 sets - 15 reps - Lunge with Counter Support  - 1 x daily - 7 x weekly - 2 sets - 15 reps - Standing Tandem Balance with Counter Support  - 1 x daily - 7 x weekly - 2 sets - 2 reps - 30 hold - Standing Single Leg Stance with Counter Support  - 1 x daily - 7 x weekly - 2 sets - 2 reps - 30 hold  GOALS: Goals reviewed with patient? Yes  SHORT  TERM GOALS: Target date: 01/21/2024  Pt will be independent with HEP in order to improve strength and balance in order to decrease fall risk and improve function at home.  Baseline: EVAL - No current formal HEP in place Goal status: INITIAL   LONG TERM GOALS: Target date: 03/03/2024  1.  Patient will complete five times sit to stand test in < 15 seconds indicating an increased LE strength and improved balance. Baseline: EVAL= 25.20 sec with LUE Support 9/2: 23.36 without UE support and CGA-min assist. 20.10 sec with LUE support Goal status: INITIAL  2.  Patient will increase Berg Balance score by > 6 points to demonstrate decreased fall risk during functional activities. Baseline: EVAL: To be assessed next visit; 12/12/2023= 42/56 9/2: 44/56  Goal status: INITIAL   3.  Patient will reduce timed up and go to <11 seconds to reduce fall risk and demonstrate improved transfer/gait ability. Baseline: EVAL = 40 sec with RW 9/2: 18.33sec with SPC Goal status: INITIAL  4.  Patient will increase 10 meter walk test to >1.92m/s as to improve gait speed for better community ambulation and to reduce fall risk. Baseline: EVAL: 0.65 m/s 9/2: 0.67 m/s with [facility] 9/9: 0.71 m/s without an AD and wearing 2.5 # AW Goal status: INITIAL  5. Patient will increase six minute walk test distance by 200 feet for progression to community ambulator and improve gait ability Baseline: EVAL= 482' 9/2: 465 feet  using a SPC  with CGA required seated rest break at 4:17min  Goal status: INITIAL   ASSESSMENT:  CLINICAL IMPRESSION:  Pt completing multiple bouts of gait this date to promote increased reciprocal swing through with SPC. Adjusted SPC for improved gait mechanics as well. Pt still limited by decreased endurance.  Pt will continue to benefit from skilled therapy to address remaining deficits in order to improve overall QoL and return to PLOF.     OBJECTIVE IMPAIRMENTS: Abnormal gait, decreased activity tolerance, decreased balance, decreased coordination, decreased endurance, decreased mobility, difficulty walking, decreased ROM, decreased strength, impaired UE functional use, and pain.   ACTIVITY LIMITATIONS: carrying, lifting, bending, sitting, standing, squatting, sleeping, stairs, and transfers  PARTICIPATION LIMITATIONS: meal prep, cleaning, laundry, driving, shopping, community activity, and yard work  PERSONAL FACTORS: 1-2 comorbidities: OA, previous CVA with significant R sided weakness are also affecting patient's functional outcome.   REHAB POTENTIAL: Good  CLINICAL DECISION MAKING: Stable/uncomplicated  EVALUATION COMPLEXITY: Moderate  PLAN:  PT FREQUENCY: 1-2x/week  PT DURATION: 12 weeks  PLANNED INTERVENTIONS: 97164- PT Re-evaluation, 97750- Physical Performance Testing, 97110-Therapeutic exercises, 97530- Therapeutic activity, [id number]- Neuromuscular re-education, 97535- Self Care, [id number]- Manual therapy, [id number]- Gait training, [phone]- Orthotic Initial, [phone]- Orthotic/Prosthetic subsequent, [phone]- Canalith repositioning, [phone]- Electrical stimulation (manual), [phone] (1-2 muscles), [phone] (3+ muscles)- Dry Needling, Patient/Family education, Balance training, Stair training, Taping, Joint mobilization, Joint manipulation, Spinal manipulation, Spinal mobilization, Compression bandaging, Vestibular training, DME instructions, Cryotherapy, and Moist heat  PLAN FOR NEXT SESSION:  *Progress  note* Continue  Curb step navigation training  B LE functional strengthening Sit<>stands Step-ups  Gait training with LRAD with increased activity tolerance through hall of hospital to reduce self limiting behavior.  Decrease seated rest breaks for improved endurance   Chiquita Silvan, SPT Physical Therapy Student - [place]  [facility]  5:13 PM 02/18/24

## 2024-02-18 NOTE — Therapy (Signed)
 OUTPATIENT OCCUPATIONAL THERAPY NEURO TREATMENT NOTE   Patient Name: Erika Stanton MRN: [id number] DOB:18-Jun-1955, 68 y.o., female Today's Date: 02/18/2024  PCP: Laurice Anes, MD REFERRING PROVIDER: Laurice Anes, MD   OT End of Session - 02/18/24 1646     Visit Number 19    Number of Visits 24    Date for Recertification  03/03/24    Authorization Time Period Reporting period beginning 01/16/24    OT Start Time 1530    OT Stop Time 1615    OT Time Calculation (min) 45 min    Activity Tolerance Patient tolerated treatment well    Behavior During Therapy WFL for tasks assessed/performed         Past Medical History:  Diagnosis Date   Anatomical narrow angle, bilateral    Aneurysm    Asthma    Cerebral aneurysm    Cerebral vasospasm    Cognitive change    Dysphagia, post-stroke    GERD (gastroesophageal reflux disease)    Hemorrhoids    History of cervical dysplasia    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 no recurrent polyps.  suggest repeat surveillance study 11/2022.     COLONOSCOPY W/ POLYPECTOMY  06/2014   Dr Elder at [facility]  3 adenomatoous polyps, anal fissure.     ESOPHAGOGASTRODUODENOSCOPY (EGD) WITH PROPOFOL  N/A 01/27/2020   Procedure: ESOPHAGOGASTRODUODENOSCOPY (EGD) WITH PROPOFOL ;  Surgeon: Shila Gustav GAILS, MD;  Location: [facility] ENDOSCOPY;  Service: Endoscopy;  Laterality: N/A;   INSERTION OF PERMANENT INTRAPERITONEAL CANNULA/CATH N/A    LAPAROSCOPIC   IR 3D INDEPENDENT WKST  01/06/2020   IR ANGIO INTRA EXTRACRAN SEL INTERNAL  CAROTID BILAT MOD SED  01/06/2020   IR ANGIO VERTEBRAL SEL VERTEBRAL UNI L MOD SED  01/06/2020   IR ANGIOGRAM FOLLOW UP STUDY  01/06/2020   IR ANGIOGRAM FOLLOW UP STUDY  01/06/2020   IR ANGIOGRAM FOLLOW UP STUDY  01/06/2020   IR ANGIOGRAM FOLLOW UP STUDY  01/06/2020   IR ANGIOGRAM FOLLOW UP STUDY  01/06/2020   IR ANGIOGRAM FOLLOW UP STUDY  01/06/2020   IR ANGIOGRAM FOLLOW UP STUDY  01/06/2020   IR ANGIOGRAM FOLLOW UP STUDY  01/06/2020   IR ANGIOGRAM FOLLOW UP STUDY  01/06/2020   IR ANGIOGRAM FOLLOW UP STUDY  01/06/2020   IR NEURO EACH ADD'L AFTER BASIC UNI RIGHT (MS)  01/06/2020   IR TRANSCATH/EMBOLIZ  01/06/2020   MASTECTOMY     MOHS SURGERY N/A    RADIOLOGY WITH ANESTHESIA N/A 01/06/2020   Procedure: IR WITH ANESTHESIA FOR ANEURYSM;  Surgeon: Lanis Pupa, MD;  Location: [facility] OR;  Service: Radiology;  Laterality: N/A;   STENT SUPPORTED EMBOLIZATION OF RIGHT ICA ANEURYSM Right    TUBAL LIGATION     VENTRICULO-PERITONEAL SHUNT PLACEMENT / LAPAROSCOPIC INSERTION PERITONEAL CATHETER N/A    Patient Active Problem List   Diagnosis Date Noted   Dyspareunia in female 04/05/2020   History of abnormal cervical Pap smear 03/10/2020  GIB (gastrointestinal bleeding) 02/28/2020   Elevated BUN    Prediabetes    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   Cerebral vasospasm    Sinus tachycardia    Dysphagia, post-stroke    Thrombocytopenia    Acute blood loss anemia    Brain aneurysm    ICH (intracerebral hemorrhage) (HCC) 01/05/2020   History of adenomatous polyp of colon 02/07/2016   Anatomical narrow angle, bilateral 12/14/2014   Fissure in ano 11/11/2014   Hemorrhoids 11/11/2014   Constipation 11/19/2013   GERD (gastroesophageal reflux disease) 03/24/2013   Irritable bowel syndrome with diarrhea 03/24/2013   Herpes zoster 05/14/2005   Abnormal Pap smear of cervix 05/15/1983   History of cervical dysplasia 05/15/1983   ONSET DATE: 12/02/2023  REFERRING DIAG: L ischemic  CVA  THERAPY DIAG:  Muscle weakness (generalized)  Rationale for Evaluation and Treatment: Rehabilitation  SUBJECTIVE:  SUBJECTIVE STATEMENT: Pt. reports doing okay today. Pt accompanied by: self  PERTINENT HISTORY:  Pt. was admitted to [facility] from 12/02/23-12/06/23 with an Acute Right MCA CVA, Hydrocephalus with shunt  placement. History of Subarachnoid Hemorrhage with bilateral ICA coiled aneurysms. 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  PRECAUTIONS: None  WEIGHT BEARING RESTRICTIONS: No  02/18/24: PAIN  02/18/24: No pain  02/05/24: 4/10 bilat knees Are you having pain? No  FALLS: Has patient fallen in last 6 months? Yes. Number of falls 1  LIVING ENVIRONMENT: Lives with: lives with their family and lives with their spouse Lives in: House/apartment Stairs: hand rails, 2 steps   Has following equipment at home: [name], cane  PLOF: Independent with basic ADLs, Spouse help as needed  PATIENT GOALS: Strengthening  OBJECTIVE:  Note: Objective measures were completed at Evaluation unless otherwise noted.  HAND DOMINANCE: Left  ADLs: Overall ADLs: spouse assists when needed, Pt. reports that she can do most tasks herself. Uses her left hand to engage in self-care tasks. Eating: Able to cut foods with a fork, husband assists as needed.  Grooming: husband assists with applying toothpaste, able to brush teeth and hair UB Dressing: Spouse assists with applying/fastening bra, Pt. Requires assistance with zippers, and buttons. LB Dressing: Spouse assists with tying shoes when needed, uses slip on shoes. Pt. Does not zip pants. Toileting: Able to complete independently Bathing: Pt. Requires intermittent  assistance with bathing tasks. Tub Shower transfers: Spouse assists with transfers when/if needed Equipment: Shower seat with back  IADLs: Shopping: Spouse typically does the shopping, isnt able to carry heavy objects.  Light housekeeping: Spouse typically does most of the cleaning around the home, Pt. Cleans bathroom, and laundry Pt. Is unable to carry laundry due to exacerbation of weakness.  Meal Prep: Spouse typically cooks and prepares meals.  Community mobility: Pt. Requires assistance using walker. Pt. reports that she is unable to carry heavy items, and typically does not do the shopping.  Medication management: Pt. is able to take care of meds with pillbox Financial management:  Spouse takes care of it, and makes online payments.  Handwriting: 50% legible  MOBILITY STATUS: Needs Assist: Pt. Requires use of a walker   POSTURE COMMENTS:  No Significant postural limitations Sitting balance: Good  ACTIVITY TOLERANCE: Activity tolerance:   FUNCTIONAL OUTCOME MEASURES: TBD  UPPER EXTREMITY ROM:    Active  ROM Right eval Right 01/16/24 Left eval Left 01/16/24  Shoulder flexion 94(121) 100 with scaption (132) 102(125) 134 with scaption (145)  Shoulder abduction 75(106) 74 (98) 110(126) 140 (170)  Shoulder adduction      Shoulder extension      Shoulder internal rotation      Shoulder external rotation      Elbow flexion 135(155)  140(150)   Elbow extension 0  -10(-9)   Wrist flexion 40(44)  42(58)   Wrist extension 30(34)  40(50)   Wrist ulnar deviation      Wrist radial deviation      Wrist pronation      Wrist supination      (Blank rows = not tested)  UPPER EXTREMITY MMT:     MMT Right eval Left eval  Shoulder flexion 3-/5 3-/5  Shoulder abduction 3-/5 3-/5  Shoulder adduction    Shoulder extension    Shoulder internal rotation    Shoulder external rotation    Middle trapezius    Lower trapezius    Elbow flexion 4-/5 4+/5  Elbow extension 4-/5 4+/5  Wrist  flexion 4-/5 3+/5  Wrist extension 4-/5 3+/5  Wrist ulnar deviation    Wrist radial deviation    Wrist pronation    Wrist supination    (Blank rows = not tested)  HAND FUNCTION: Grip strength: Right: N/T lbs; Left: 43 lbs, Lateral pinch: Right: N/T lbs, Left: 9 lbs, and 3 point pinch: Right: N/T lbs, Left: 5 lbs Pt. Presents with flexor tightness in the right 5th digit, however is able to passively extend 5th digit within normal range. Pt. Also Presents with hyper extension in the 2nd and 3rd digit at PIP joints. Pt. Is able to achieve full Digit MP, PIP, and DIP PROM.  01/16/24: Grip strength: Left: 33 lbs; Lateral pinching: Left: 8 lbs; 3 point pinch: Left: 3 lbs  COORDINATION: 9 Hole Peg test: Right: N/T sec; Left: 51 sec 01/16/24: Left: 1 min 1 sec  SENSATION: Not tested  EDEMA:   MUSCLE TONE: Fluctuating flexor tone in R hand.  COGNITION: Overall cognitive status: Within functional limits for tasks assessed  VISION: Subjective report:  Baseline vision:  Visual history:   VISION ASSESSMENT:  PERCEPTION:   PRAXIS:   OBSERVATIONS: Pt. presents with fluctuating flexor tone in the R hand, involving the 5th digit at the PIP/DIP joint. Pt. Presents with hyperextension at the 2nd and 3rd digits at the PIP joints.                                                                                                                    TREATMENT DATE: 02/18/24  Therapeutic Ex.:    -Pt. tolerated right hand PROM through all joint ranges.   Self-care:  -Pt. worked on the IADL task of writing with emphasis placed on left hand pen grasp, letter formation, and writing legibility while formulating lists of words.      PATIENT EDUCATION: Education details: ROM,pen grasp, Mining engineer Person  educated: Patient Education method: Explanation, Demonstration, and Verbal cues Education comprehension: verbalized understanding, returned demonstration, verbal cues required, and needs further  education  HOME EXERCISE PROGRAM: -Pink theraputty; visual handout issued -Self and caregiver assisted PROM to R hand and wrist -Monitor splint with skin checks   GOALS: Goals reviewed with patient? Yes  SHORT TERM GOALS: Target date: 01/21/2024    Pt. Will be independent with HEP for UE functioning. Baseline: Eval: No current HEP; 01/16/24: pt reports doing a little writing and squeezing a ball, but denies using putty; spouse stretches RUE daily Goal status: in progress  LONG TERM GOALS: Target date: 03/03/2024    Pt. Will improve [facility] skills to be able to be able to manipulate small objects at home Baseline: 9 hole peg test: L: 51 seconds; 01/16/24: L 1 min 1 sec Goal status: in progress  2.  Pt. Will increase BUE shoulder ROM by 10 degrees to be able to reach into cabinets and shelves.  Baseline: Shoulder flexion: R: 94(121), L: 102(125), Shoulder abduction: R: 75(106), L: 110(126); 01/16/24: Shoulder flexion: R: 100 with scaption (132), L: 134 with scaption (145), Shoulder abduction: R: 74 (98), L: 140 (170) Goal status: in progress  3.  Pt. Will improve L grip strength by 5# to be able to securely grasp ADL items at home.  Baseline: Grip strength: R: NT, L: 43#; 01/16/24: L: 33# Goal status: in progress  4.  Pt. Will increase L lateral key pinch strength by 3#  to open wide mouth jars.  Baseline: Lateral key pinch: R: NT, L: 9#; 01/16/24: L: 8# Goal status: in progress  5.  Pt. Will independently engage the right hand as a gross assist to the left hand 100% of the time during ADLs/IADLs. Baseline: Eval: Pt. With limited engagement of the right hand as a gross assist during tasks; 01/16/24: Observed <25% of the time during OT sessions  Goal status: in progress  6.  Pt. will improve left hand [facility] skills by 3 sec. of speed to be able to manipulate zippers, and buttons efficiently. Baseline: Eval:Right: NT Left: 51 sec; 01/16/24: Left: 1 min and 1 sec  Goal status: in progress  7. Pt.  Will improve handwriting legibility to 100% in printed form for written correspondence.  Baseline: 50% legible in printed form for first name only; 01/16/24: 80-90% legible to print first name 1 of 4 attempts.  3 of 4 attempts 50% or less legible Goal status: in progress  ASSESSMENT: CLINICAL IMPRESSION:  Pt. required cues, and assist for the initial grasp on the pen with the left hand. Pt. was able to formulate her first name in 3/4 sized letters with 100% legibility in printed form. Pt. was able to formulate word lists with 75% initially, however legibility decreased to 25% 1/4 of the way through the word list and for the remaining words. Pt. continues to benefit from OT services to increase BUE ROM in the shoulder, wrist and hands, as well as increasing L grip/pinch strength and improve [facility] skills to be able to perform her desired ADL/IADLs.     PERFORMANCE DEFICITS: in functional skills including ADLs, IADLs, coordination, dexterity, edema, tone, ROM, strength, pain, Fine motor control, Gross motor control, endurance, and UE functional use, and psychosocial skills including coping strategies, environmental adaptation, habits, and routines and behaviors.   IMPAIRMENTS: are limiting patient from ADLs, IADLs, rest and sleep, leisure, and social participation.   CO-MORBIDITIES: may have co-morbidities  that affects occupational performance. Patient will  benefit from skilled OT to address above impairments and improve overall function.  MODIFICATION OR ASSISTANCE TO COMPLETE EVALUATION: Min-Moderate modification of tasks or assist with assess necessary to complete an evaluation.  OT OCCUPATIONAL PROFILE AND HISTORY: Detailed assessment: Review of records and additional review of physical, cognitive, psychosocial history related to current functional performance.  CLINICAL DECISION MAKING: Moderate - several treatment options, min-mod task modification necessary  REHAB POTENTIAL:  Good  EVALUATION COMPLEXITY: Moderate    PLAN:  OT FREQUENCY: 2x/week  OT DURATION: 12 weeks  PLANNED INTERVENTIONS: 97168 OT Re-evaluation, 97535 self care/ADL training, 02889 therapeutic exercise, 97530 therapeutic activity, 97112 neuromuscular re-education, 97140 manual therapy, 97018 paraffin, 02960 fluidotherapy, 97010 moist heat, 97010 cryotherapy, 97034 contrast bath, 97032 electrical stimulation (manual), 97760 Orthotic Initial, passive range of motion, energy conservation, coping strategies training, patient/family education, and DME and/or AE instructions  RECOMMENDED OTHER SERVICES: PT   CONSULTED AND AGREED WITH PLAN OF CARE: Patient  PLAN FOR NEXT SESSION: Treatment  Chrisotpher Rivero, MS, OTR/L   02/18/2024, 4:49 PM

## 2024-02-19 ENCOUNTER — Encounter

## 2024-02-19 ENCOUNTER — Ambulatory Visit

## 2024-02-19 ENCOUNTER — Encounter: Admitting: Speech Pathology

## 2024-02-20 ENCOUNTER — Ambulatory Visit: Admitting: Occupational Therapy

## 2024-02-20 DIAGNOSIS — M6281 Muscle weakness (generalized): Secondary | ICD-10-CM | POA: Diagnosis not present

## 2024-02-21 NOTE — Therapy (Signed)
 Occupational Therapy Progress Note  Dates of reporting period  01/16/24   to   02/20/24    Patient Name: Erika Stanton MRN: [id number] DOB:07/04/1955, 68 y.o., female Today's Date: 02/21/2024  PCP: Laurice Anes, MD REFERRING PROVIDER: Laurice Anes, MD   OT End of Session - 02/21/24 0023     Visit Number 20    Number of Visits 24    Date for Recertification  03/03/24    OT Start Time 1615    OT Stop Time 1700    OT Time Calculation (min) 45 min    Equipment Utilized During Treatment transport chair    Activity Tolerance Patient tolerated treatment well    Behavior During Therapy WFL for tasks assessed/performed          Past Medical History:  Diagnosis Date   Anatomical narrow angle, bilateral    Aneurysm    Asthma    Cerebral aneurysm    Cerebral vasospasm    Cognitive change    Dysphagia, post-stroke    GERD (gastroesophageal reflux disease)    Hemorrhoids    History of cervical dysplasia    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 no recurrent polyps.  suggest repeat surveillance study 11/2022.     COLONOSCOPY W/ POLYPECTOMY  06/2014   Dr Elder at [facility].  3 adenomatoous polyps, anal fissure.     ESOPHAGOGASTRODUODENOSCOPY (EGD) WITH PROPOFOL  N/A 01/27/2020   Procedure: ESOPHAGOGASTRODUODENOSCOPY (EGD) WITH PROPOFOL ;  Surgeon: Shila Gustav GAILS, MD;  Location: [facility] ENDOSCOPY;  Service: Endoscopy;  Laterality: N/A;   INSERTION OF PERMANENT INTRAPERITONEAL CANNULA/CATH N/A    LAPAROSCOPIC   IR 3D INDEPENDENT WKST  01/06/2020   IR  ANGIO INTRA EXTRACRAN SEL INTERNAL CAROTID BILAT MOD SED  01/06/2020   IR ANGIO VERTEBRAL SEL VERTEBRAL UNI L MOD SED  01/06/2020   IR ANGIOGRAM FOLLOW UP STUDY  01/06/2020   IR ANGIOGRAM FOLLOW UP STUDY  01/06/2020   IR ANGIOGRAM FOLLOW UP STUDY  01/06/2020   IR ANGIOGRAM FOLLOW UP STUDY  01/06/2020   IR ANGIOGRAM FOLLOW UP STUDY  01/06/2020   IR ANGIOGRAM FOLLOW UP STUDY  01/06/2020   IR ANGIOGRAM FOLLOW UP STUDY  01/06/2020   IR ANGIOGRAM FOLLOW UP STUDY  01/06/2020   IR ANGIOGRAM FOLLOW UP STUDY  01/06/2020   IR ANGIOGRAM FOLLOW UP STUDY  01/06/2020   IR NEURO EACH ADD'L AFTER BASIC UNI RIGHT (MS)  01/06/2020   IR TRANSCATH/EMBOLIZ  01/06/2020   MASTECTOMY     MOHS SURGERY N/A    RADIOLOGY WITH ANESTHESIA N/A 01/06/2020   Procedure: IR WITH ANESTHESIA FOR ANEURYSM;  Surgeon: Lanis Pupa, MD;  Location: [facility] OR;  Service: Radiology;  Laterality: N/A;   STENT SUPPORTED EMBOLIZATION OF RIGHT ICA ANEURYSM Right    TUBAL LIGATION     VENTRICULO-PERITONEAL SHUNT PLACEMENT / LAPAROSCOPIC INSERTION PERITONEAL CATHETER N/A    Patient Active Problem List   Diagnosis Date Noted   Dyspareunia in female  04/05/2020   History of abnormal cervical Pap smear 03/10/2020   GIB (gastrointestinal bleeding) 02/28/2020   Elevated BUN    Prediabetes    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   Cerebral vasospasm    Sinus tachycardia    Dysphagia, post-stroke    Thrombocytopenia    Acute blood loss anemia    Brain aneurysm    ICH (intracerebral hemorrhage) (HCC) 01/05/2020   History of adenomatous polyp of colon 02/07/2016   Anatomical narrow angle, bilateral 12/14/2014   Fissure in ano 11/11/2014   Hemorrhoids 11/11/2014   Constipation 11/19/2013   GERD (gastroesophageal reflux disease) 03/24/2013   Irritable bowel syndrome with diarrhea 03/24/2013   Herpes zoster 05/14/2005   Abnormal Pap smear of cervix 05/15/1983   History of cervical dysplasia 05/15/1983   ONSET DATE:  12/02/2023  REFERRING DIAG: L ischemic CVA  THERAPY DIAG:  Muscle weakness (generalized)  Rationale for Evaluation and Treatment: Rehabilitation  SUBJECTIVE:  SUBJECTIVE STATEMENT: Pt. reports  that she only has OT today Pt accompanied by: self  PERTINENT HISTORY:  Pt. was admitted to [facility] from 12/02/23-12/06/23 with an Acute Right MCA CVA, Hydrocephalus with shunt  placement. History of Subarachnoid Hemorrhage with bilateral ICA coiled aneurysms. 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  PRECAUTIONS: None  WEIGHT BEARING RESTRICTIONS: No  02/18/24: PAIN  02/18/24: No pain  02/05/24: 4/10 bilat knees Are you having pain? No  FALLS: Has patient fallen in last 6 months? Yes. Number of falls 1  LIVING ENVIRONMENT: Lives with: lives with their family and lives with their spouse Lives in: House/apartment Stairs: hand rails, 2 steps   Has following equipment at home: [name], cane  PLOF: Independent with basic ADLs, Spouse help as needed  PATIENT GOALS: Strengthening  OBJECTIVE:  Note: Objective measures were completed at Evaluation unless otherwise noted.  HAND DOMINANCE: Left  ADLs: Overall ADLs: spouse assists when needed, Pt. reports that she can do most tasks herself. Uses her left hand to engage in self-care tasks. Eating: Able to cut foods with a fork, husband assists as needed.  Grooming: husband assists with applying toothpaste, able to brush teeth and hair UB Dressing: Spouse assists with applying/fastening bra, Pt. Requires assistance with zippers, and buttons. LB Dressing: Spouse assists with tying shoes when needed, uses slip on shoes. Pt. Does not zip pants. Toileting: Able to complete  independently Bathing: Pt. Requires intermittent assistance with bathing tasks. Tub Shower transfers: Spouse assists with transfers when/if needed Equipment: Shower seat with back  IADLs: Shopping: Spouse typically does the shopping, isnt able to carry heavy objects.  Light housekeeping: Spouse typically does most of the cleaning around the home, Pt. Cleans bathroom, and laundry Pt. Is unable to carry laundry due to exacerbation of weakness.  Meal Prep: Spouse typically cooks and prepares meals.  Community mobility: Pt. Requires assistance using walker. Pt. reports that she is unable to carry heavy items, and typically does not do the shopping.  Medication management: Pt. is able to take care of meds with pillbox Financial management:  Spouse takes care of it, and makes online payments.  Handwriting: 50% legible  MOBILITY STATUS: Needs Assist: Pt. Requires use of a walker   POSTURE COMMENTS:  No Significant postural limitations Sitting balance: Good  ACTIVITY TOLERANCE:  Activity tolerance:   FUNCTIONAL OUTCOME MEASURES: TBD  UPPER EXTREMITY ROM:    Active ROM Right eval Right 01/16/24 Left eval Left 01/16/24  Shoulder flexion 94(121) 100 with scaption (132) 102(125) 134 with scaption (145)  Shoulder abduction 75(106) 74 (98) 110(126) 140 (170)  Shoulder adduction      Shoulder extension      Shoulder internal rotation      Shoulder external rotation      Elbow flexion 135(155)  140(150)   Elbow extension 0  -10(-9)   Wrist flexion 40(44)  42(58)   Wrist extension 30(34)  40(50)   Wrist ulnar deviation      Wrist radial deviation      Wrist pronation      Wrist supination      (Blank rows = not tested)  UPPER EXTREMITY MMT:     MMT Right eval Left eval  Shoulder flexion 3-/5 3-/5  Shoulder abduction 3-/5 3-/5  Shoulder adduction    Shoulder extension    Shoulder internal rotation    Shoulder external rotation    Middle trapezius    Lower trapezius    Elbow  flexion 4-/5 4+/5  Elbow extension 4-/5 4+/5  Wrist flexion 4-/5 3+/5  Wrist extension 4-/5 3+/5  Wrist ulnar deviation    Wrist radial deviation    Wrist pronation    Wrist supination    (Blank rows = not tested)  HAND FUNCTION: Grip strength: Right: N/T lbs; Left: 43 lbs, Lateral pinch: Right: N/T lbs, Left: 9 lbs, and 3 point pinch: Right: N/T lbs, Left: 5 lbs Pt. Presents with flexor tightness in the right 5th digit, however is able to passively extend 5th digit within normal range. Pt. Also Presents with hyper extension in the 2nd and 3rd digit at PIP joints. Pt. Is able to achieve full Digit MP, PIP, and DIP PROM.  01/16/24: Grip strength: Left: 33 lbs; Lateral pinching: Left: 8 lbs; 3 point pinch: Left: 3 lbs  COORDINATION: 9 Hole Peg test: Right: N/T sec; Left: 51 sec 01/16/24: Left: 1 min 1 sec  SENSATION: Not tested  EDEMA:   MUSCLE TONE: Fluctuating flexor tone in R hand.  COGNITION: Overall cognitive status: Within functional limits for tasks assessed  VISION: Subjective report:  Baseline vision:  Visual history:   VISION ASSESSMENT:  PERCEPTION:   PRAXIS:   OBSERVATIONS: Pt. presents with fluctuating flexor tone in the R hand, involving the 5th digit at the PIP/DIP joint. Pt. Presents with hyperextension at the 2nd and 3rd digits at the PIP joints.                                                                                                                    TREATMENT DATE: 02/20/24  Moist heat applied to R hand used intermittently throughout OT session to promote reduced spasticity/relax R hand for optimal positioning   Therapeutic Ex.:    -Pt. tolerated right hand PROM/AAROM through all joint ranges.    Therapeutic Activities:    -  facilitated lateral, and 3pt. pinch strengthening using yellow, red, green, and blue level resistive clips starting with the left hand with cue  in combination with bilateral motor control skills transferring them from her  left hand hand to the right hand, then reaching out  through multiple planes to discard them into a container placed at the tabletop. Pt. required fewer cues for hand clip position.    PATIENT EDUCATION: Education details: ROM,pen grasp, Mining engineer Person educated: Patient Education method: Explanation, Demonstration, and Verbal cues Education comprehension: verbalized understanding, returned demonstration, verbal cues required, and needs further education  HOME EXERCISE PROGRAM: -Pink theraputty; visual handout issued -Self and caregiver assisted PROM to R hand and wrist -Monitor splint with skin checks   GOALS: Goals reviewed with patient? Yes  SHORT TERM GOALS: Target date: 01/21/2024    Pt. Will be independent with HEP for UE functioning. Baseline: Eval: No current HEP; 01/16/24: pt reports doing a little writing and squeezing a ball, but denies using putty; spouse stretches RUE daily 02/20/24:  Pt. Continues to do a little writing and squeezing a ball, but denies using putty; spouse stretches RUE Goal status: in progress  LONG TERM GOALS: Target date: 03/03/2024    Pt. Will improve [facility] skills to be able to be able to manipulate small objects at home Baseline: 9 hole peg test: L: 51 seconds; 01/16/24: L 1 min 1 sec  Goal status: Ongoing  2.  Pt. Will increase BUE shoulder ROM by 10 degrees to be able to reach into cabinets and shelves.  Baseline: Shoulder flexion: R: 94(121), L: 102(125), Shoulder abduction: R: 75(106), L: 110(126); 01/16/24: Shoulder flexion: R: 100 with scaption (132), L: 134 with scaption (145), Shoulder abduction: R: 74 (98), L: 140 (170)02/20/24: Pt. Is improving with functional reaching through multiple planes with cues, and assist required. Goal status: Ongoing  3.  Pt. Will improve L grip strength by 5# to be able to securely grasp ADL items at home.  Baseline: Grip strength: R: NT, L: 43#; 01/16/24: L: 33#  Goal status: Ongoing  4.  Pt. Will increase L  lateral key pinch strength by 3#  to open wide mouth jars.  Baseline: Lateral key pinch: R: NT, L: 9#; 01/16/24: L: 8#  Goal status: Ongoing  5.  Pt. Will independently engage the right hand as a gross assist to the left hand 100% of the time during ADLs/IADLs. Baseline: Eval: Pt. With limited engagement of the right hand as a gross assist during tasks; 01/16/24: Observed <25% of the time during OT sessions 02/20/24: Pt. Continues to present with limited engagement of the RUE as a gross assist to the left hand approximately 25% of the time with cues. Goal status: Ongoing  6.  Pt. will  be able to manipulate zippers, and buttons efficiently with modified independence Baseline: Eval:Right: NT Left: 51 sec; 01/16/24: Left: 1 min and 1 sec 02/20/24: Pt. Continues to have difficulty manipulating zippers and buttons. Goal status: Ongoing   7. Pt. Will improve handwriting legibility to 100% for one sentence in printed form for written correspondence.  Baseline: 50% legible in printed form for first name only; 01/16/24: 80-90% legible to print first name 1 of 4 attempts.  3 of 4 attempts 50% or less legible 02/20/24: first name only 100%, 25% for lists of words.  Goal status: Ongoing  ASSESSMENT: CLINICAL IMPRESSION:  Pt. Is making progress overall with left UE functioning. Pt. Is improving with bilateral UE functional reaching through multiple planes.  Patient has improved with writing her first name legibly however legibility decreases when writing lists of words.  Patient continues to require and cueing to initiate engaging the right hand as a gross assist to the left hand. Pt. continues to benefit from OT services to increase BUE ROM in the shoulder, wrist and hands, as well as increasing L grip/pinch strength and improve [facility] skills to be able to perform her desired ADL/IADLs.     PERFORMANCE DEFICITS: in functional skills including ADLs, IADLs, coordination, dexterity, edema, tone, ROM, strength, pain,  Fine motor control, Gross motor control, endurance, and UE functional use, and psychosocial skills including coping strategies, environmental adaptation, habits, and routines and behaviors.   IMPAIRMENTS: are limiting patient from ADLs, IADLs, rest and sleep, leisure, and social participation.   CO-MORBIDITIES: may have co-morbidities  that affects occupational performance. Patient will benefit from skilled OT to address above impairments and improve overall function.  MODIFICATION OR ASSISTANCE TO COMPLETE EVALUATION: Min-Moderate modification of tasks or assist with assess necessary to complete an evaluation.  OT OCCUPATIONAL PROFILE AND HISTORY: Detailed assessment: Review of records and additional review of physical, cognitive, psychosocial history related to current functional performance.  CLINICAL DECISION MAKING: Moderate - several treatment options, min-mod task modification necessary  REHAB POTENTIAL: Good  EVALUATION COMPLEXITY: Moderate    PLAN:  OT FREQUENCY: 2x/week  OT DURATION: 12 weeks  PLANNED INTERVENTIONS: 97168 OT Re-evaluation, 97535 self care/ADL training, 02889 therapeutic exercise, 97530 therapeutic activity, 97112 neuromuscular re-education, 97140 manual therapy, 97018 paraffin, 02960 fluidotherapy, 97010 moist heat, 97010 cryotherapy, 97034 contrast bath, 97032 electrical stimulation (manual), 97760 Orthotic Initial, passive range of motion, energy conservation, coping strategies training, patient/family education, and DME and/or AE instructions  RECOMMENDED OTHER SERVICES: PT   CONSULTED AND AGREED WITH PLAN OF CARE: Patient  PLAN FOR NEXT SESSION: Treatment  Richardson Otter, MS, OTR/L   02/21/2024, 12:29 AM

## 2024-02-24 ENCOUNTER — Encounter

## 2024-02-24 ENCOUNTER — Encounter: Admitting: Speech Pathology

## 2024-02-24 ENCOUNTER — Ambulatory Visit

## 2024-02-25 ENCOUNTER — Ambulatory Visit

## 2024-02-26 ENCOUNTER — Ambulatory Visit

## 2024-02-26 ENCOUNTER — Encounter

## 2024-02-26 ENCOUNTER — Encounter: Admitting: Speech Pathology

## 2024-02-27 ENCOUNTER — Ambulatory Visit: Admitting: Physical Therapy

## 2024-02-27 ENCOUNTER — Ambulatory Visit: Admitting: Occupational Therapy

## 2024-02-27 DIAGNOSIS — M6281 Muscle weakness (generalized): Secondary | ICD-10-CM

## 2024-02-27 DIAGNOSIS — R2689 Other abnormalities of gait and mobility: Secondary | ICD-10-CM

## 2024-02-27 DIAGNOSIS — R278 Other lack of coordination: Secondary | ICD-10-CM

## 2024-02-27 DIAGNOSIS — R269 Unspecified abnormalities of gait and mobility: Secondary | ICD-10-CM

## 2024-02-27 DIAGNOSIS — R262 Difficulty in walking, not elsewhere classified: Secondary | ICD-10-CM

## 2024-02-27 DIAGNOSIS — R296 Repeated falls: Secondary | ICD-10-CM

## 2024-02-27 NOTE — Therapy (Addendum)
 OUTPATIENT PHYSICAL THERAPY TREATMENT/Physical Therapy Progress Note   Dates of reporting period  01/16/24   to   02/27/24    Patient Name: Erika Stanton MRN: [id number] DOB:29-Apr-1956, 68 y.o., female Today's Date: 02/27/2024  PCP: Dr. Oneil Galloway  REFERRING PROVIDER: Dr. Oneil Galloway  END OF SESSION:   PT End of Session - 02/27/24 1040     Visit Number 20    Number of Visits 24    Date for Recertification  03/03/24    Authorization Type [employer]    Progress Note Due on Visit 20    PT Start Time 1100    PT Stop Time 1142    PT Time Calculation (min) 42 min    Equipment Utilized During Treatment Gait belt    Activity Tolerance Patient tolerated treatment well;No increased pain    Behavior During Therapy WFL for tasks assessed/performed             Past Medical History:  Diagnosis Date   Anatomical narrow angle, bilateral    Aneurysm    Asthma    Cerebral aneurysm    Cerebral vasospasm    Cognitive change    Dysphagia, post-stroke    GERD (gastroesophageal reflux disease)    Hemorrhoids    History of cervical dysplasia    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 no recurrent polyps.  suggest repeat surveillance study 11/2022.     COLONOSCOPY W/ POLYPECTOMY  06/2014   Dr Elder at [facility].  3 adenomatoous polyps, anal fissure.     ESOPHAGOGASTRODUODENOSCOPY (EGD) WITH PROPOFOL  N/A 01/27/2020   Procedure: ESOPHAGOGASTRODUODENOSCOPY (EGD) WITH PROPOFOL ;  Surgeon: Shila Gustav GAILS, MD;  Location: [facility];  Service:  Endoscopy;  Laterality: N/A;   INSERTION OF PERMANENT INTRAPERITONEAL CANNULA/CATH N/A    LAPAROSCOPIC   IR 3D INDEPENDENT WKST  01/06/2020   IR ANGIO INTRA EXTRACRAN SEL INTERNAL CAROTID BILAT MOD SED  01/06/2020   IR ANGIO VERTEBRAL SEL VERTEBRAL UNI L MOD SED  01/06/2020   IR ANGIOGRAM FOLLOW UP STUDY  01/06/2020   IR ANGIOGRAM FOLLOW UP STUDY  01/06/2020   IR ANGIOGRAM FOLLOW UP STUDY  01/06/2020   IR ANGIOGRAM FOLLOW UP STUDY  01/06/2020   IR ANGIOGRAM FOLLOW UP STUDY  01/06/2020   IR ANGIOGRAM FOLLOW UP STUDY  01/06/2020   IR ANGIOGRAM FOLLOW UP STUDY  01/06/2020   IR ANGIOGRAM FOLLOW UP STUDY  01/06/2020   IR ANGIOGRAM FOLLOW UP STUDY  01/06/2020   IR ANGIOGRAM FOLLOW UP STUDY  01/06/2020   IR NEURO EACH ADD'L AFTER BASIC UNI RIGHT (MS)  01/06/2020   IR TRANSCATH/EMBOLIZ  01/06/2020   MASTECTOMY     MOHS SURGERY N/A    RADIOLOGY WITH ANESTHESIA N/A 01/06/2020   Procedure: IR WITH ANESTHESIA FOR ANEURYSM;  Surgeon: Lanis Pupa, MD;  Location: [facility] OR;  Service: Radiology;  Laterality: N/A;   STENT SUPPORTED EMBOLIZATION OF RIGHT ICA ANEURYSM Right    TUBAL LIGATION  VENTRICULO-PERITONEAL SHUNT PLACEMENT / LAPAROSCOPIC INSERTION PERITONEAL CATHETER N/A    Patient Active Problem List   Diagnosis Date Noted   Dyspareunia in female 04/05/2020   History of abnormal cervical Pap smear 03/10/2020   GIB (gastrointestinal bleeding) 02/28/2020   Elevated BUN    Prediabetes    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   Cerebral vasospasm    Sinus tachycardia    Dysphagia, post-stroke    Thrombocytopenia    Acute blood loss anemia    Brain aneurysm    ICH (intracerebral hemorrhage) (HCC) 01/05/2020   History of adenomatous polyp of colon 02/07/2016   Anatomical narrow angle, bilateral 12/14/2014   Fissure in ano 11/11/2014   Hemorrhoids 11/11/2014   Constipation 11/19/2013   GERD (gastroesophageal reflux disease) 03/24/2013   Irritable bowel syndrome with diarrhea  03/24/2013   Herpes zoster 05/14/2005   Abnormal Pap smear of cervix 05/15/1983   History of cervical dysplasia 05/15/1983    ONSET DATE: 12/02/2023  REFERRING DIAG: I63.9 (ICD-10-CM) - CVA (cerebral vascular accident) (HCC)   THERAPY DIAG:  Abnormality of gait and mobility  Difficulty in walking, not elsewhere classified  Repeated falls  Other abnormalities of gait and mobility  Rationale for Evaluation and Treatment: Rehabilitation  SUBJECTIVE:                                                                                                                                                                                             SUBJECTIVE STATEMENT:  Pt reports she is doing okay today, some knee pain/soreness reported. Pt denies any falls or stumbles since last visit. Is eager to continue making progress.   PERTINENT HISTORY:  Patient reports she was in hospital from [facility]- Sat (July 21st- 25th) with CVA affecting left side. I was supposed to have a knee replacement surgery in September but that is on hold for now. I was doing my prep for my colonoscopy and fell in the bathroom.  Patient reports having mild left sided weakness compared to her CVA in 2021 with heavy Right sided UE weakness. She reports bad OA affecting both knees as well. Previous CVA in 2021 affecting Right side. Reports going to ED  last Monday due to fall/CVA affecting Left side. Diagnosed with R CVA with Left Sided weakness.    PAIN:  Are you having pain? 3/10 bilat knees   PRECAUTIONS: Fall  WEIGHT BEARING RESTRICTIONS: No  FALLS: Has patient fallen in last 6 months? Yes. Number of falls fell with the stroke  PATIENT GOALS: Walk without any assistive device.  OBJECTIVE:  Note: Objective measures were completed at evaluation unless otherwise  noted.  LOWER EXTREMITY MMT:    MMT Right Eval Left Eval  Hip flexion 4 4  Hip internal rotation 2- 2-  Hip external rotation 3+ 3+  Knee flexion 3+ 3+   Knee extension 4 4  Ankle dorsiflexion 4 4  (Blank rows = not tested)  FUNCTIONAL TESTS:  5 times sit to stand: 25.20 sec with LUE Support Timed up and go (TUG): 40 sec with RW (increased time to place right hand on walker)  6 minute walk test: 482' 10 meter walk test: 0.65 m/s Berg Balance Scale: 42/56   PATIENT SURVEYS:  Stroke Impact Scale - 65.63%                                                                                                                             TREATMENT DATE : 02/27/2024  Unless otherwise noted, CGA provided & gait belt donned.   -Patient arrives to session in transport chair.  SIS 16- 65.6%   Physical Performance Test or Measurement: a  physical performance test(s) or measurement (eg,  musculoskeletal, functional capacity), with written report,  each 15 mins   Patient demonstrates increased fall risk as noted by score of  45 /56 on Berg Balance Scale.  (<36= high risk for falls, close to 100%; 37-45 significant >80%; 46-51 moderate >50%; 52-55 lower >25%)  [facility] PT Assessment - 02/27/24 0001       Berg Balance Test   Sit to Stand Able to stand without using hands and stabilize independently    Standing Unsupported Able to stand safely 2 minutes    Sitting with Back Unsupported but Feet Supported on Floor or Stool Able to sit safely and securely 2 minutes    Stand to Sit Sits safely with minimal use of hands    Transfers Able to transfer safely, definite need of hands    Standing Unsupported with Eyes Closed Able to stand 10 seconds safely    Standing Unsupported with Feet Together Able to place feet together independently and stand 1 minute safely    From Standing, Reach Forward with Outstretched Arm Can reach confidently >25 cm (10)    From Standing Position, Pick up Object from Floor Able to pick up shoe safely and easily    From Standing Position, Turn to Look Behind Over each Shoulder Looks behind one side only/other side shows less weight  shift    Turn 360 Degrees Able to turn 360 degrees safely but slowly    Standing Unsupported, Alternately Place Feet on Step/Stool Able to complete 4 steps without aid or supervision    Standing Unsupported, One Foot in Front Able to take small step independently and hold 30 seconds    Standing on One Leg Tries to lift leg/unable to hold 3 seconds but remains standing independently    Total Score 45          PT instructed pt in TUG: 17.47 sec with SPC sec ( >  13.5 sec indicates increased fall risk)  10 Meter Walk Test: Patient instructed to walk 10 meters (32.8 ft) as quickly and as safely as possible at their normal speed Results: 75 m/s with SPC m/s   Cut off scores:   Household Ambulator  < 0.4 m/s  Limited Community Ambulator  0.4 - 0.8 m/s  [employer]  > 0.8 m/s  Increased fall risk  < 1.5m/s  Crossing a Street  >1.26m/s  MCID 0.05 m/s (small), 0.13 m/s (moderate), 0.06 m/s (significant)  (ANPTA Core Set of Outcome Measures for Adults with Neurologic Conditions, 2018)     6 Min Walk Test:  Instructed patient to ambulate as quickly and as safely as possible for 6 minutes using LRAD. Patient was allowed to take standing rest breaks without stopping the test, but if the patient required a sitting rest break the clock would be stopped and the test would be over.  Results: 542 ft  feet using a SPC with CGA. Pt takes 2 standing rest breaks during test.  Results indicate that the patient has reduced endurance with ambulation compared to age matched norms.  Age Matched Norms (in meters): 25-69 yo M: 55 F: 35, 78-79 yo M: 67 F: 471, 40-89 yo M: 417 F: 392 MDC: 58.21 meters (190.98 feet) or 50 meters (ANPTA Core Set of Outcome Measures for Adults with Neurologic Conditions, 2018)      PATIENT EDUCATION: Education details: PT plan of care; Discussion of symptoms of CVA and to go to ED as soon as possible. Purpose of PT for balance, strength, coordination and  endurance.  Person educated: Patient Education method: Explanation Education comprehension: verbalized understanding  HOME EXERCISE PROGRAM: Access Code: [email or web address] URL: [email or web address] Date: 12/17/2023 Prepared by: Sidra Simpers  Program Notes **Be sure to perform all exercises next to a countertop or sturdy piece of furniture in case you become unsteady.  Exercises - Standing Heel Raise with Support  - 1 x daily - 7 x weekly - 2 sets - 15 reps - Standing Toe Raises at Chair  - 1 x daily - 7 x weekly - 2 sets - 15 reps - Mini Squat with Counter Support  - 1 x daily - 7 x weekly - 2 sets - 15 reps - Lunge with Counter Support  - 1 x daily - 7 x weekly - 2 sets - 15 reps - Standing Tandem Balance with Counter Support  - 1 x daily - 7 x weekly - 2 sets - 2 reps - 30 hold - Standing Single Leg Stance with Counter Support  - 1 x daily - 7 x weekly - 2 sets - 2 reps - 30 hold  GOALS: Goals reviewed with patient? Yes  SHORT TERM GOALS: Target date: 01/21/2024  Pt will be independent with HEP in order to improve strength and balance in order to decrease fall risk and improve function at home.  Baseline: EVAL - No current formal HEP in place Goal status: INITIAL   LONG TERM GOALS: Target date: 03/03/2024  1.  Patient will complete five times sit to stand test in < 15 seconds indicating an increased LE strength and improved balance. Baseline: EVAL= 25.20 sec with LUE Support 9/2: 23.36 without UE support and CGA-min assist. 20.10 sec with LUE support Goal status: INITIAL  2.  Patient will increase Berg Balance score by > 6 points to demonstrate decreased fall risk during functional activities. Baseline: EVAL: To be assessed next visit; 12/12/2023= 42/56 9/2: 44/56  10/16:45 Goal status: INITIAL   3.  Patient will reduce timed up and go to <11 seconds to reduce fall risk and demonstrate improved transfer/gait ability. Baseline: EVAL = 40 sec with RW 9/2: 18.33sec  with SPC 10/16:17.47 sec with SPC Goal status: INITIAL  4.  Patient will increase 10 meter walk test to >1.46m/s as to improve gait speed for better community ambulation and to reduce fall risk. Baseline: EVAL: 0.65 m/s 9/2: 0.67 m/s with [facility] 9/9: 0.71 m/s without an AD and wearing 2.5 # AW 10/16: .75 m/s with SPC Goal status: INITIAL  5. Patient will increase six minute walk test distance by 200 feet for progression to community ambulator and improve gait ability Baseline: EVAL= 482' 9/2: 465 feet  using a SPC with CGA required seated rest break at 4:63min  10/16: 542 ft and full 6 minutes  Goal status: INITIAL   ASSESSMENT:  CLINICAL IMPRESSION:   Patient arrived with good motivation for completion of pt activities.  Pt shows good progress towards all her long term goals showing improved gait ability, improved gait speed, and improved fall risk. Pt still at risk for falls as a result of testing. Pt will continue to benefit from skilled physical therapy intervention to address impairments, improve QOL, and attain therapy goals. Patient's condition has the potential to improve in response to therapy. Maximum improvement is yet to be obtained. The anticipated improvement is attainable and reasonable in a generally predictable time.     OBJECTIVE IMPAIRMENTS: Abnormal gait, decreased activity tolerance, decreased balance, decreased coordination, decreased endurance, decreased mobility, difficulty walking, decreased ROM, decreased strength, impaired UE functional use, and pain.   ACTIVITY LIMITATIONS: carrying, lifting, bending, sitting, standing, squatting, sleeping, stairs, and transfers  PARTICIPATION LIMITATIONS: meal prep, cleaning, laundry, driving, shopping, community activity, and yard work  PERSONAL FACTORS: 1-2 comorbidities: OA, previous CVA with significant R sided weakness are also affecting patient's functional outcome.   REHAB POTENTIAL: Good  CLINICAL DECISION MAKING:  Stable/uncomplicated  EVALUATION COMPLEXITY: Moderate  PLAN:  PT FREQUENCY: 1-2x/week  PT DURATION: 12 weeks  PLANNED INTERVENTIONS: 97164- PT Re-evaluation, 97750- Physical Performance Testing, 97110-Therapeutic exercises, 97530- Therapeutic activity, [id number]- Neuromuscular re-education, 97535- Self Care, [id number]- Manual therapy, [id number]- Gait training, [phone]- Orthotic Initial, [phone]- Orthotic/Prosthetic subsequent, [phone]- Canalith repositioning, [phone]- Electrical stimulation (manual), [phone] (1-2 muscles), [id number] (3+ muscles)- Dry Needling, Patient/Family education, Balance training, Stair training, Taping, Joint mobilization, Joint manipulation, Spinal manipulation, Spinal mobilization, Compression bandaging, Vestibular training, DME instructions, Cryotherapy, and Moist heat  PLAN FOR NEXT SESSION:  *Progress note* Continue  Curb step navigation training  B LE functional strengthening Sit<>stands Step-ups  Gait training with LRAD with increased activity tolerance through hall of hospital to reduce self limiting behavior.  Decrease seated rest breaks for improved endurance   Note: Portions of this document were prepared using Dragon voice recognition software and although reviewed may contain unintentional dictation errors in syntax, grammar, or spelling.  Lonni KATHEE Gainer PT ,DPT Physical Therapist- [place]  [facility]    2:30 PM 02/27/24

## 2024-02-28 NOTE — Therapy (Signed)
 Occupational Therapy Neuro Treatment Note   Patient Name: Erika Stanton MRN: [id number] DOB:[birth year], 68 y.o., female Today's Date: 02/28/2024  PCP: Erika Anes, MD REFERRING PROVIDER: Laurice Anes, MD   OT End of Session - 02/28/24 0015     Visit Number 21    Number of Visits 24    Date for Recertification  03/03/24    OT Start Time 1015    OT Stop Time 1100    OT Time Calculation (min) 45 min    Activity Tolerance Patient tolerated treatment well    Behavior During Therapy [facility] for tasks assessed/performed          Past Medical History:  Diagnosis Date   Anatomical narrow angle, bilateral    Aneurysm    Asthma    Cerebral aneurysm    Cerebral vasospasm    Cognitive change    Dysphagia, post-stroke    GERD (gastroesophageal reflux disease)    Hemorrhoids    History of cervical dysplasia    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 no recurrent polyps.  suggest repeat surveillance study 11/2022.     COLONOSCOPY W/ POLYPECTOMY  06/2014   Dr Erika Stanton at [facility].  3 adenomatoous polyps, anal fissure.     ESOPHAGOGASTRODUODENOSCOPY (EGD) WITH PROPOFOL  N/A 01/27/2020   Procedure: ESOPHAGOGASTRODUODENOSCOPY (EGD) WITH PROPOFOL ;  Surgeon: Erika Gustav GAILS, MD;  Location: [facility] ENDOSCOPY;  Service: Endoscopy;  Laterality: N/A;   INSERTION OF PERMANENT INTRAPERITONEAL CANNULA/CATH N/A    LAPAROSCOPIC   IR 3D INDEPENDENT WKST  01/06/2020   IR ANGIO INTRA EXTRACRAN SEL INTERNAL CAROTID BILAT MOD SED  01/06/2020   IR ANGIO VERTEBRAL SEL VERTEBRAL  UNI L MOD SED  01/06/2020   IR ANGIOGRAM FOLLOW UP STUDY  01/06/2020   IR ANGIOGRAM FOLLOW UP STUDY  01/06/2020   IR ANGIOGRAM FOLLOW UP STUDY  01/06/2020   IR ANGIOGRAM FOLLOW UP STUDY  01/06/2020   IR ANGIOGRAM FOLLOW UP STUDY  01/06/2020   IR ANGIOGRAM FOLLOW UP STUDY  01/06/2020   IR ANGIOGRAM FOLLOW UP STUDY  01/06/2020   IR ANGIOGRAM FOLLOW UP STUDY  01/06/2020   IR ANGIOGRAM FOLLOW UP STUDY  01/06/2020   IR ANGIOGRAM FOLLOW UP STUDY  01/06/2020   IR NEURO EACH ADD'L AFTER BASIC UNI RIGHT (MS)  01/06/2020   IR TRANSCATH/EMBOLIZ  01/06/2020   MASTECTOMY     MOHS SURGERY N/A    RADIOLOGY WITH ANESTHESIA N/A 01/06/2020   Procedure: IR WITH ANESTHESIA FOR ANEURYSM;  Surgeon: Erika Pupa, MD;  Location: [facility] OR;  Service: Radiology;  Laterality: N/A;   STENT SUPPORTED EMBOLIZATION OF RIGHT ICA ANEURYSM Right    TUBAL LIGATION     VENTRICULO-PERITONEAL SHUNT PLACEMENT / LAPAROSCOPIC INSERTION PERITONEAL CATHETER N/A    Patient Active Problem List   Diagnosis Date Noted   Dyspareunia in female 04/05/2020   History of abnormal cervical Pap smear 03/10/2020   GIB (gastrointestinal bleeding) 02/28/2020   Elevated BUN  Prediabetes    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   Cerebral vasospasm    Sinus tachycardia    Dysphagia, post-stroke    Thrombocytopenia    Acute blood loss anemia    Brain aneurysm    ICH (intracerebral hemorrhage) (HCC) 01/05/2020   History of adenomatous polyp of colon 02/07/2016   Anatomical narrow angle, bilateral 12/14/2014   Fissure in ano 11/11/2014   Hemorrhoids 11/11/2014   Constipation 11/19/2013   GERD (gastroesophageal reflux disease) 03/24/2013   Irritable bowel syndrome with diarrhea 03/24/2013   Herpes zoster 05/14/2005   Abnormal Pap smear of cervix 05/15/1983   History of cervical dysplasia 05/15/1983   ONSET DATE: 12/02/2023  REFERRING DIAG: L ischemic CVA  THERAPY DIAG:  Muscle weakness (generalized)  Other lack of  coordination  Rationale for Evaluation and Treatment: Rehabilitation  SUBJECTIVE:  SUBJECTIVE STATEMENT:  Pt. reports doing okay today  PERTINENT HISTORY:  Pt. was admitted to [facility] from 12/02/23-12/06/23 with an Acute Right MCA CVA, Hydrocephalus with shunt  placement. History of Subarachnoid Hemorrhage with bilateral ICA coiled aneurysms. 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  PRECAUTIONS: None  WEIGHT BEARING RESTRICTIONS: No  02/18/24: PAIN  02/28/24: No pain 02/18/24: No pain  02/05/24: 4/10 bilat knees Are you having pain? No  FALLS: Has patient fallen in last 6 months? Yes. Number of falls 1  LIVING ENVIRONMENT: Lives with: lives with their family and lives with their spouse Lives in: House/apartment Stairs: hand rails, 2 steps   Has following equipment at home: [name], cane  PLOF: Independent with basic ADLs, Spouse help as needed  PATIENT GOALS: Strengthening  OBJECTIVE:  Note: Objective measures were completed at Evaluation unless otherwise noted.  HAND DOMINANCE: Left  ADLs: Overall ADLs: spouse assists when needed, Pt. reports that she can do most tasks herself. Uses her left hand to engage in self-care tasks. Eating: Able to cut foods with a fork, husband assists as needed.  Grooming: husband assists with applying toothpaste, able to brush teeth and hair UB Dressing: Spouse assists with applying/fastening bra, Pt. Requires assistance with zippers, and buttons. LB Dressing: Spouse assists with tying shoes when needed, uses slip on shoes. Pt. Does not zip pants. Toileting: Able to complete independently Bathing: Pt. Requires intermittent assistance with bathing tasks. Tub Shower  transfers: Spouse assists with transfers when/if needed Equipment: Shower seat with back  IADLs: Shopping: Spouse typically does the shopping, isnt able to carry heavy objects.  Light housekeeping: Spouse typically does most of the cleaning around the home, Pt. Cleans bathroom, and laundry Pt. Is unable to carry laundry due to exacerbation of weakness.  Meal Prep: Spouse typically cooks and prepares meals.  Community mobility: Pt. Requires assistance using walker. Pt. reports that she is unable to carry heavy items, and typically does not do the shopping.  Medication management: Pt. is able to take care of meds with pillbox Financial management:  Spouse takes care of it, and makes online payments.  Handwriting: 50% legible  MOBILITY STATUS: Needs Assist: Pt. Requires use of a walker   POSTURE COMMENTS:  No Significant postural limitations Sitting balance: Good  ACTIVITY TOLERANCE: Activity tolerance:   FUNCTIONAL OUTCOME MEASURES: TBD  UPPER EXTREMITY ROM:    Active ROM Right eval Right 01/16/24 Left  eval Left 01/16/24  Shoulder flexion 94(121) 100 with scaption (132) 102(125) 134 with scaption (145)  Shoulder abduction 75(106) 74 (98) 110(126) 140 (170)  Shoulder adduction      Shoulder extension      Shoulder internal rotation      Shoulder external rotation      Elbow flexion 135(155)  140(150)   Elbow extension 0  -10(-9)   Wrist flexion 40(44)  42(58)   Wrist extension 30(34)  40(50)   Wrist ulnar deviation      Wrist radial deviation      Wrist pronation      Wrist supination      (Blank rows = not tested)  UPPER EXTREMITY MMT:     MMT Right eval Left eval  Shoulder flexion 3-/5 3-/5  Shoulder abduction 3-/5 3-/5  Shoulder adduction    Shoulder extension    Shoulder internal rotation    Shoulder external rotation    Middle trapezius    Lower trapezius    Elbow flexion 4-/5 4+/5  Elbow extension 4-/5 4+/5  Wrist flexion 4-/5 3+/5  Wrist extension 4-/5  3+/5  Wrist ulnar deviation    Wrist radial deviation    Wrist pronation    Wrist supination    (Blank rows = not tested)  HAND FUNCTION: Grip strength: Right: N/T lbs; Left: 43 lbs, Lateral pinch: Right: N/T lbs, Left: 9 lbs, and 3 point pinch: Right: N/T lbs, Left: 5 lbs Pt. Presents with flexor tightness in the right 5th digit, however is able to passively extend 5th digit within normal range. Pt. Also Presents with hyper extension in the 2nd and 3rd digit at PIP joints. Pt. Is able to achieve full Digit MP, PIP, and DIP PROM.  01/16/24: Grip strength: Left: 33 lbs; Lateral pinching: Left: 8 lbs; 3 point pinch: Left: 3 lbs  COORDINATION: 9 Hole Peg test: Right: N/T sec; Left: 51 sec 01/16/24: Left: 1 min 1 sec  SENSATION: Not tested  EDEMA:   MUSCLE TONE: Fluctuating flexor tone in R hand.  COGNITION: Overall cognitive status: Within functional limits for tasks assessed  VISION: Subjective report:  Baseline vision:  Visual history:   VISION ASSESSMENT:  PERCEPTION:   PRAXIS:   OBSERVATIONS: Pt. presents with fluctuating flexor tone in the R hand, involving the 5th digit at the PIP/DIP joint. Pt. Presents with hyperextension at the 2nd and 3rd digits at the PIP joints.                                                                                                                    TREATMENT DATE: 02/27/24  Moist heat applied to right hand, and right  shoulder used intermittently throughout OT session to promote reduced spasticity/relax R hand for optimal positioning   Therapeutic Ex.:    -Pt. tolerated right hand PROM/AAROM through all joint ranges to decrease stiffness, normalize tone, and prepare the hand to engage in tasks.   Therapeutic Activities:    Pt. worked on grasping,  and manipulating 3/4, and 1washers from a magnetic dish using point grasp pattern the left hand. Pt. Then worked on transferring each washer from the left hand  to the right hand at midline,  followed by using the right hand to  reach up, stabilize, and sustain the  shoulder in elevation while placing the washer over a small precise unstable/moveable target on vertical a dowel. Multiple reps were performed.     PATIENT EDUCATION: Education details: ROM,pen grasp, Mining engineer Person educated: Patient Education method: Explanation, Demonstration, and Verbal cues Education comprehension: verbalized understanding, returned demonstration, verbal cues required, and needs further education  HOME EXERCISE PROGRAM: -Pink theraputty; visual handout issued -Self and caregiver assisted PROM to R hand and wrist -Monitor splint with skin checks   GOALS: Goals reviewed with patient? Yes  SHORT TERM GOALS: Target date: 01/21/2024    Pt. Will be independent with HEP for UE functioning. Baseline: Eval: No current HEP; 01/16/24: pt reports doing a little writing and squeezing a ball, but denies using putty; spouse stretches RUE daily 02/20/24:  Pt. Continues to do a little writing and squeezing a ball, but denies using putty; spouse stretches RUE Goal status: in progress  LONG TERM GOALS: Target date: 03/03/2024    Pt. Will improve [facility] skills to be able to be able to manipulate small objects at home Baseline: 9 hole peg test: L: 51 seconds; 01/16/24: L 1 min 1 sec  Goal status: Ongoing  2.  Pt. Will increase BUE shoulder ROM by 10 degrees to be able to reach into cabinets and shelves.  Baseline: Shoulder flexion: R: 94(121), L: 102(125), Shoulder abduction: R: 75(106), L: 110(126); 01/16/24: Shoulder flexion: R: 100 with scaption (132), L: 134 with scaption (145), Shoulder abduction: R: 74 (98), L: 140 (170)02/20/24: Pt. Is improving with functional reaching through multiple planes with cues, and assist required. Goal status: Ongoing  3.  Pt. Will improve L grip strength by 5# to be able to securely grasp ADL items at home.  Baseline: Grip strength: R: NT, L: 43#; 01/16/24: L: 33#  Goal  status: Ongoing  4.  Pt. Will increase L lateral key pinch strength by 3#  to open wide mouth jars.  Baseline: Lateral key pinch: R: NT, L: 9#; 01/16/24: L: 8#  Goal status: Ongoing  5.  Pt. Will independently engage the right hand as a gross assist to the left hand 100% of the time during ADLs/IADLs. Baseline: Eval: Pt. With limited engagement of the right hand as a gross assist during tasks; 01/16/24: Observed <25% of the time during OT sessions 02/20/24: Pt. Continues to present with limited engagement of the RUE as a gross assist to the left hand approximately 25% of the time with cues. Goal status: Ongoing  6.  Pt. will  be able to manipulate zippers, and buttons efficiently with modified independence Baseline: Eval:Right: NT Left: 51 sec; 01/16/24: Left: 1 min and 1 sec 02/20/24: Pt. Continues to have difficulty manipulating zippers and buttons. Goal status: Ongoing   7. Pt. Will improve handwriting legibility to 100% for one sentence in printed form for written correspondence.  Baseline: 50% legible in printed form for first name only; 01/16/24: 80-90% legible to print first name 1 of 4 attempts.  3 of 4 attempts 50% or less legible 02/20/24: first name only 100%, 25% for lists of words.  Goal status: Ongoing  ASSESSMENT: CLINICAL IMPRESSION:  Pt. is making progress. Pt.was able to consistently reach up to place the washers over  a small precise unstable/movable target. Pt. Accuracy improved as the task progressed. Pt. continues to benefit from OT services to increase BUE ROM in the shoulder, wrist and hands, as well as increasing L grip/pinch strength and improve [facility] skills to be able to perform her desired ADL/IADLs.     PERFORMANCE DEFICITS: in functional skills including ADLs, IADLs, coordination, dexterity, edema, tone, ROM, strength, pain, Fine motor control, Gross motor control, endurance, and UE functional use, and psychosocial skills including coping strategies, environmental  adaptation, habits, and routines and behaviors.   IMPAIRMENTS: are limiting patient from ADLs, IADLs, rest and sleep, leisure, and social participation.   CO-MORBIDITIES: may have co-morbidities  that affects occupational performance. Patient will benefit from skilled OT to address above impairments and improve overall function.  MODIFICATION OR ASSISTANCE TO COMPLETE EVALUATION: Min-Moderate modification of tasks or assist with assess necessary to complete an evaluation.  OT OCCUPATIONAL PROFILE AND HISTORY: Detailed assessment: Review of records and additional review of physical, cognitive, psychosocial history related to current functional performance.  CLINICAL DECISION MAKING: Moderate - several treatment options, min-mod task modification necessary  REHAB POTENTIAL: Good  EVALUATION COMPLEXITY: Moderate    PLAN:  OT FREQUENCY: 2x/week  OT DURATION: 12 weeks  PLANNED INTERVENTIONS: 97168 OT Re-evaluation, 97535 self care/ADL training, 02889 therapeutic exercise, 97530 therapeutic activity, 97112 neuromuscular re-education, 97140 manual therapy, 97018 paraffin, 02960 fluidotherapy, 97010 moist heat, 97010 cryotherapy, 97034 contrast bath, 97032 electrical stimulation (manual), 97760 Orthotic Initial, passive range of motion, energy conservation, coping strategies training, patient/family education, and DME and/or AE instructions  RECOMMENDED OTHER SERVICES: PT   CONSULTED AND AGREED WITH PLAN OF CARE: Patient  PLAN FOR NEXT SESSION: Treatment  Richardson Otter, MS, OTR/L   02/28/2024, 12:17 AM

## 2024-03-02 ENCOUNTER — Ambulatory Visit: Admitting: Occupational Therapy

## 2024-03-02 ENCOUNTER — Ambulatory Visit

## 2024-03-02 ENCOUNTER — Encounter: Admitting: Speech Pathology

## 2024-03-02 DIAGNOSIS — R278 Other lack of coordination: Secondary | ICD-10-CM

## 2024-03-02 DIAGNOSIS — M6281 Muscle weakness (generalized): Secondary | ICD-10-CM

## 2024-03-02 DIAGNOSIS — R262 Difficulty in walking, not elsewhere classified: Secondary | ICD-10-CM

## 2024-03-02 DIAGNOSIS — R296 Repeated falls: Secondary | ICD-10-CM

## 2024-03-02 DIAGNOSIS — R269 Unspecified abnormalities of gait and mobility: Secondary | ICD-10-CM

## 2024-03-02 NOTE — Addendum Note (Signed)
 Addended by: Tyiesha Brackney M on: 03/02/2024 05:59 PM   Modules accepted: Orders

## 2024-03-02 NOTE — Therapy (Signed)
 OUTPATIENT PHYSICAL THERAPY TREATMENT  Patient Name: Erika Stanton MRN: [id number] DOB:Mar 17, 1956, 68 y.o., female Today's Date: 03/02/2024  PCP: Dr. Oneil Galloway  REFERRING PROVIDER: Dr. Oneil Galloway  END OF SESSION:   PT End of Session - 03/02/24 1407     Visit Number 21    Number of Visits 24    Date for Recertification  03/03/24    Authorization Type [employer]    Progress Note Due on Visit 20    PT Start Time 1403    PT Stop Time 1443    PT Time Calculation (min) 40 min    Equipment Utilized During Treatment Gait belt    Activity Tolerance Patient tolerated treatment well;No increased pain    Behavior During Therapy WFL for tasks assessed/performed             Past Medical History:  Diagnosis Date   Anatomical narrow angle, bilateral    Aneurysm    Asthma    Cerebral aneurysm    Cerebral vasospasm    Cognitive change    Dysphagia, post-stroke    GERD (gastroesophageal reflux disease)    Hemorrhoids    History of cervical dysplasia    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 no recurrent polyps.  suggest repeat surveillance study 11/2022.     COLONOSCOPY W/ POLYPECTOMY  06/2014   Dr Elder at [facility].  3 adenomatoous polyps, anal fissure.     ESOPHAGOGASTRODUODENOSCOPY (EGD) WITH PROPOFOL  N/A 01/27/2020   Procedure: ESOPHAGOGASTRODUODENOSCOPY (EGD) WITH PROPOFOL ;  Surgeon: Shila Gustav GAILS, MD;  Location: [facility] ENDOSCOPY;  Service: Endoscopy;  Laterality: N/A;   INSERTION OF PERMANENT INTRAPERITONEAL CANNULA/CATH N/A     LAPAROSCOPIC   IR 3D INDEPENDENT WKST  01/06/2020   IR ANGIO INTRA EXTRACRAN SEL INTERNAL CAROTID BILAT MOD SED  01/06/2020   IR ANGIO VERTEBRAL SEL VERTEBRAL UNI L MOD SED  01/06/2020   IR ANGIOGRAM FOLLOW UP STUDY  01/06/2020   IR ANGIOGRAM FOLLOW UP STUDY  01/06/2020   IR ANGIOGRAM FOLLOW UP STUDY  01/06/2020   IR ANGIOGRAM FOLLOW UP STUDY  01/06/2020   IR ANGIOGRAM FOLLOW UP STUDY  01/06/2020   IR ANGIOGRAM FOLLOW UP STUDY  01/06/2020   IR ANGIOGRAM FOLLOW UP STUDY  01/06/2020   IR ANGIOGRAM FOLLOW UP STUDY  01/06/2020   IR ANGIOGRAM FOLLOW UP STUDY  01/06/2020   IR ANGIOGRAM FOLLOW UP STUDY  01/06/2020   IR NEURO EACH ADD'L AFTER BASIC UNI RIGHT (MS)  01/06/2020   IR TRANSCATH/EMBOLIZ  01/06/2020   MASTECTOMY     MOHS SURGERY N/A    RADIOLOGY WITH ANESTHESIA N/A 01/06/2020   Procedure: IR WITH ANESTHESIA FOR ANEURYSM;  Surgeon: Lanis Pupa, MD;  Location: [facility] OR;  Service: Radiology;  Laterality: N/A;   STENT SUPPORTED EMBOLIZATION OF RIGHT ICA ANEURYSM Right    TUBAL LIGATION     VENTRICULO-PERITONEAL SHUNT PLACEMENT / LAPAROSCOPIC INSERTION PERITONEAL CATHETER N/A    Patient Active Problem List  Diagnosis Date Noted   Dyspareunia in female 04/05/2020   History of abnormal cervical Pap smear 03/10/2020   GIB (gastrointestinal bleeding) 02/28/2020   Elevated BUN    Prediabetes    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   Cerebral vasospasm    Sinus tachycardia    Dysphagia, post-stroke    Thrombocytopenia    Acute blood loss anemia    Brain aneurysm    ICH (intracerebral hemorrhage) (HCC) 01/05/2020   History of adenomatous polyp of colon 02/07/2016   Anatomical narrow angle, bilateral 12/14/2014   Fissure in ano 11/11/2014   Hemorrhoids 11/11/2014   Constipation 11/19/2013   GERD (gastroesophageal reflux disease) 03/24/2013   Irritable bowel syndrome with diarrhea 03/24/2013   Herpes zoster 05/14/2005   Abnormal Pap smear of cervix 05/15/1983   History  of cervical dysplasia 05/15/1983    ONSET DATE: 12/02/2023  REFERRING DIAG: I63.9 (ICD-10-CM) - CVA (cerebral vascular accident) (HCC)   THERAPY DIAG:  Muscle weakness (generalized)  Other lack of coordination  Abnormality of gait and mobility  Difficulty in walking, not elsewhere classified  Repeated falls  Rationale for Evaluation and Treatment: Rehabilitation  SUBJECTIVE:                                                                                                                                                                                             SUBJECTIVE STATEMENT:  Pt says she is doing fine today. Asking is insurance approved more visits.   PERTINENT HISTORY:  Patient reports she was in hospital from [facility]- Sat (July 21st- 25th) with CVA affecting left side. I was supposed to have a knee replacement surgery in September but that is on hold for now. I was doing my prep for my colonoscopy and fell in the bathroom.  Patient reports having mild left sided weakness compared to her CVA in 2021 with heavy Right sided UE weakness. She reports bad OA affecting both knees as well. Previous CVA in 2021 affecting Right side. Reports going to ED  last Monday due to fall/CVA affecting Left side. Diagnosed with R CVA with Left Sided weakness.    PAIN:  Are you having pain? 3/10 bilat knees   PRECAUTIONS: Fall  WEIGHT BEARING RESTRICTIONS: No  FALLS: Has patient fallen in last 6 months? Yes. Number of falls fell with the stroke  PATIENT GOALS: Walk without any assistive device.  OBJECTIVE:  Note: Objective measures were completed at evaluation unless otherwise noted.  TREATMENT DATE : 03/02/2024 -discussion of recent reassessment and plan for recert, stronger focus on achieving more AMB function.  -AMB to water  fountain c SPC, minGuard  -AMB to  [facility] in (556ft) c SPC oir transport chair: 37sec, several standing DOE breaks -STS to/from rocking chair with minA -seated recovery x4 minutes  -AMB back from [facility] in, outdoors for half, 472ft, 92m 12sec -transport to OT  PATIENT EDUCATION: Education details: PT plan of care; Discussion of symptoms of CVA and to go to ED as soon as possible. Purpose of PT for balance, strength, coordination and endurance.  Person educated: Patient Education method: Explanation Education comprehension: verbalized understanding  HOME EXERCISE PROGRAM: Access Code: [email or web address] URL: [email or web address] Date: 12/17/2023 Prepared by: Sidra Simpers  Program Notes **Be sure to perform all exercises next to a countertop or sturdy piece of furniture in case you become unsteady.  Exercises - Standing Heel Raise with Support  - 1 x daily - 7 x weekly - 2 sets - 15 reps - Standing Toe Raises at Chair  - 1 x daily - 7 x weekly - 2 sets - 15 reps - Mini Squat with Counter Support  - 1 x daily - 7 x weekly - 2 sets - 15 reps - Lunge with Counter Support  - 1 x daily - 7 x weekly - 2 sets - 15 reps - Standing Tandem Balance with Counter Support  - 1 x daily - 7 x weekly - 2 sets - 2 reps - 30 hold - Standing Single Leg Stance with Counter Support  - 1 x daily - 7 x weekly - 2 sets - 2 reps - 30 hold  GOALS: Goals reviewed with patient? Yes  SHORT TERM GOALS: Target date: 01/21/2024  Pt will be independent with HEP in order to improve strength and balance in order to decrease fall risk and improve function at home.  Baseline: EVAL - No current formal HEP in place Goal status: INITIAL   LONG TERM GOALS: Target date: 03/03/2024  1.  Patient will complete five times sit to stand test in < 15 seconds indicating an increased LE strength and improved balance. Baseline: EVAL= 25.20 sec with LUE Support 9/2: 23.36 without UE support and CGA-min assist. 20.10 sec with LUE  support Goal status: INITIAL  2.  Patient will increase Berg Balance score by > 6 points to demonstrate decreased fall risk during functional activities. Baseline: EVAL: To be assessed next visit; 12/12/2023= 42/56 9/2: 44/56  10/16:45 Goal status: INITIAL   3.  Patient will reduce timed up and go to <11 seconds to reduce fall risk and demonstrate improved transfer/gait ability. Baseline: EVAL = 40 sec with RW 9/2: 18.33sec with SPC 10/16:17.47 sec with SPC Goal status: INITIAL  4.  Patient will increase 10 meter walk test to >1.29m/s as to improve gait speed for better community ambulation and to reduce fall risk. Baseline: EVAL: 0.65 m/s 9/2: 0.67 m/s with [facility] 9/9: 0.71 m/s without an AD and wearing 2.5 # AW 10/16: .75 m/s with SPC Goal status: INITIAL  5. Patient will increase six minute walk test distance by 200 feet for progression to community ambulator and improve gait ability Baseline: EVAL= 482' 9/2: 465 feet  using a SPC with CGA required seated rest break at 4:20min  10/16: 542 ft and full 6 minutes  Goal status: INITIAL   ASSESSMENT:  CLINICAL IMPRESSION:  Pt encouraged to cotninue to push hard and fight to achieve greater  progress on AMB, pt limited by recurrent DOE and chronic knee pain. Pt is very motivated to make improvements in this area. Pt will continue to benefit from skilled physical therapy intervention to address impairments, improve QOL, and attain therapy goals. Patient's condition has the potential to improve in response to therapy. Maximum improvement is yet to be obtained. The anticipated improvement is attainable and reasonable in a generally predictable time.     OBJECTIVE IMPAIRMENTS: Abnormal gait, decreased activity tolerance, decreased balance, decreased coordination, decreased endurance, decreased mobility, difficulty walking, decreased ROM, decreased strength, impaired UE functional use, and pain.   ACTIVITY LIMITATIONS: carrying, lifting,  bending, sitting, standing, squatting, sleeping, stairs, and transfers  PARTICIPATION LIMITATIONS: meal prep, cleaning, laundry, driving, shopping, community activity, and yard work  PERSONAL FACTORS: 1-2 comorbidities: OA, previous CVA with significant R sided weakness are also affecting patient's functional outcome.   REHAB POTENTIAL: Good  CLINICAL DECISION MAKING: Stable/uncomplicated  EVALUATION COMPLEXITY: Moderate  PLAN:  PT FREQUENCY: 1-2x/week  PT DURATION: 12 weeks  PLANNED INTERVENTIONS: [id number]- PT Re-evaluation, 97750- Physical Performance Testing, 97110-Therapeutic exercises, 97530- Therapeutic activity, [id number]- Neuromuscular re-education, 97535- Self Care, [id number]- Manual therapy, [id number]- Gait training, [phone]- Orthotic Initial, [phone]- Orthotic/Prosthetic subsequent, [phone]- Canalith repositioning, [phone]- Electrical stimulation (manual), [phone] (1-2 muscles), [phone] (3+ muscles)- Dry Needling, Patient/Family education, Balance training, Stair training, Taping, Joint mobilization, Joint manipulation, Spinal manipulation, Spinal mobilization, Compression bandaging, Vestibular training, DME instructions, Cryotherapy, and Moist heat  PLAN FOR NEXT SESSION:  Distance walking tolerance Curb step navigation training  BLE functional strengthening Sit<>stands Step-ups  Gait training with LRAD with increased activity tolerance through hall of hospital to reduce self limiting behavior.  Decrease seated rest breaks for improved endurance  2:54 PM, 03/02/24 Peggye JAYSON Linear, PT, DPT Physical Therapist - [facility]  Outpatient Physical Therapy- Main Campus [phone]

## 2024-03-02 NOTE — Therapy (Addendum)
 Occupational Therapy Neuro Treatment/Recertification Note   Patient Name: Erika Stanton MRN: [id number] DOB:1955-11-21, 68 y.o., female Today's Date: 03/02/2024  PCP: Laurice Anes, MD REFERRING PROVIDER: Laurice Anes, MD   OT End of Session - 03/02/24 1450     Visit Number 22    Number of Visits 48    Date for Recertification  05/25/24    OT Start Time 1450    OT Stop Time 1530    OT Time Calculation (min) 40 min    Equipment Utilized During Treatment transport chair    Activity Tolerance Patient tolerated treatment well    Behavior During Therapy WFL for tasks assessed/performed          Past Medical History:  Diagnosis Date   Anatomical narrow angle, bilateral    Aneurysm    Asthma    Cerebral aneurysm    Cerebral vasospasm    Cognitive change    Dysphagia, post-stroke    GERD (gastroesophageal reflux disease)    Hemorrhoids    History of cervical dysplasia    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 no recurrent polyps.  suggest repeat surveillance study 11/2022.     COLONOSCOPY W/ POLYPECTOMY  06/2014   Dr Elder at [facility].  3 adenomatoous polyps, anal fissure.     ESOPHAGOGASTRODUODENOSCOPY (EGD) WITH PROPOFOL  N/A 01/27/2020   Procedure: ESOPHAGOGASTRODUODENOSCOPY (EGD) WITH PROPOFOL ;  Surgeon: Shila Gustav GAILS, MD;  Location: [facility] ENDOSCOPY;  Service: Endoscopy;  Laterality: N/A;   INSERTION OF PERMANENT INTRAPERITONEAL CANNULA/CATH N/A    LAPAROSCOPIC   IR 3D INDEPENDENT WKST  01/06/2020   IR ANGIO INTRA EXTRACRAN SEL INTERNAL  CAROTID BILAT MOD SED  01/06/2020   IR ANGIO VERTEBRAL SEL VERTEBRAL UNI L MOD SED  01/06/2020   IR ANGIOGRAM FOLLOW UP STUDY  01/06/2020   IR ANGIOGRAM FOLLOW UP STUDY  01/06/2020   IR ANGIOGRAM FOLLOW UP STUDY  01/06/2020   IR ANGIOGRAM FOLLOW UP STUDY  01/06/2020   IR ANGIOGRAM FOLLOW UP STUDY  01/06/2020   IR ANGIOGRAM FOLLOW UP STUDY  01/06/2020   IR ANGIOGRAM FOLLOW UP STUDY  01/06/2020   IR ANGIOGRAM FOLLOW UP STUDY  01/06/2020   IR ANGIOGRAM FOLLOW UP STUDY  01/06/2020   IR ANGIOGRAM FOLLOW UP STUDY  01/06/2020   IR NEURO EACH ADD'L AFTER BASIC UNI RIGHT (MS)  01/06/2020   IR TRANSCATH/EMBOLIZ  01/06/2020   MASTECTOMY     MOHS SURGERY N/A    RADIOLOGY WITH ANESTHESIA N/A 01/06/2020   Procedure: IR WITH ANESTHESIA FOR ANEURYSM;  Surgeon: Lanis Pupa, MD;  Location: [facility] OR;  Service: Radiology;  Laterality: N/A;   STENT SUPPORTED EMBOLIZATION OF RIGHT ICA ANEURYSM Right    TUBAL LIGATION     VENTRICULO-PERITONEAL SHUNT PLACEMENT / LAPAROSCOPIC INSERTION PERITONEAL CATHETER N/A    Patient Active Problem List   Diagnosis Date Noted   Dyspareunia in female 04/05/2020   History of abnormal cervical Pap smear 03/10/2020   GIB (  gastrointestinal bleeding) 02/28/2020   Elevated BUN    Prediabetes    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   Cerebral vasospasm    Sinus tachycardia    Dysphagia, post-stroke    Thrombocytopenia    Acute blood loss anemia    Brain aneurysm    ICH (intracerebral hemorrhage) (HCC) 01/05/2020   History of adenomatous polyp of colon 02/07/2016   Anatomical narrow angle, bilateral 12/14/2014   Fissure in ano 11/11/2014   Hemorrhoids 11/11/2014   Constipation 11/19/2013   GERD (gastroesophageal reflux disease) 03/24/2013   Irritable bowel syndrome with diarrhea 03/24/2013   Herpes zoster 05/14/2005   Abnormal Pap smear of cervix 05/15/1983   History of cervical dysplasia 05/15/1983   ONSET DATE: 12/02/2023  REFERRING DIAG: L ischemic  CVA  THERAPY DIAG:  Muscle weakness (generalized)  Other lack of coordination  Rationale for Evaluation and Treatment: Rehabilitation  SUBJECTIVE:  SUBJECTIVE STATEMENT:  Pt. reports doing okay today  PERTINENT HISTORY:  Pt. was admitted to [facility] from 12/02/23-12/06/23 with an Acute Right MCA CVA, Hydrocephalus with shunt  placement. History of Subarachnoid Hemorrhage with bilateral ICA coiled aneurysms. 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  PRECAUTIONS: None  WEIGHT BEARING RESTRICTIONS: No  02/18/24: PAIN  02/28/24: No pain 02/18/24: No pain  02/05/24: 4/10 bilat knees Are you having pain? No  FALLS: Has patient fallen in last 6 months? Yes. Number of falls 1  LIVING ENVIRONMENT: Lives with: lives with their family and lives with their spouse Lives in: House/apartment Stairs: hand rails, 2 steps   Has following equipment at home: [name], cane  PLOF: Independent with basic ADLs, Spouse help as needed  PATIENT GOALS: Strengthening  OBJECTIVE:  Note: Objective measures were completed at Evaluation unless otherwise noted.  HAND DOMINANCE: Left  ADLs: Overall ADLs: spouse assists when needed, Pt. reports that she can do most tasks herself. Uses her left hand to engage in self-care tasks. Eating: Able to cut foods with a fork, husband assists as needed.  Grooming: husband assists with applying toothpaste, able to brush teeth and hair UB Dressing: Spouse assists with applying/fastening bra, Pt. Requires assistance with zippers, and buttons. LB Dressing: Spouse assists with tying shoes when needed, uses slip on shoes. Pt. Does not zip pants. Toileting: Able to complete independently Bathing: Pt.  Requires intermittent assistance with bathing tasks. Tub Shower transfers: Spouse assists with transfers when/if needed Equipment: Shower seat with back  IADLs: Shopping: Spouse typically does the shopping, isnt able to carry heavy objects.  Light housekeeping: Spouse typically does most of the cleaning around the home, Pt. Cleans bathroom, and laundry Pt. Is unable to carry laundry due to exacerbation of weakness.  Meal Prep: Spouse typically cooks and prepares meals.  Community mobility: Pt. Requires assistance using walker. Pt. reports that she is unable to carry heavy items, and typically does not do the shopping.  Medication management: Pt. is able to take care of meds with pillbox Financial management:  Spouse takes care of it, and makes online payments.  Handwriting: 50% legible  MOBILITY STATUS: Needs Assist: Pt. Requires use of a walker   POSTURE COMMENTS:  No Significant postural limitations Sitting balance: Good  ACTIVITY TOLERANCE: Activity tolerance:   FUNCTIONAL OUTCOME MEASURES: TBD  UPPER EXTREMITY ROM:  Active ROM Right eval Right 01/16/24 Right 03/02/24 Left eval Left 01/16/24 Left 03/02/24  Shoulder flexion 94(121) 100 with scaption (132) 126(146) 102(125) 134 with scaption (145) 142(148)  Shoulder abduction 75(106) 74 (98) 93(120) 110(126) 140 (170) WNL  Shoulder adduction        Shoulder extension        Shoulder internal rotation        Shoulder external rotation        Elbow flexion 135(155)  150(155) 140(150)  150  Elbow extension 0  0 -10(-9)  -4(0)  Wrist flexion 40(44)  48(60) 42(58)  52(60)  Wrist extension 30(34)  48(60) 40(50)  52(56)  Wrist ulnar deviation        Wrist radial deviation        Wrist pronation        Wrist supination        (Blank rows = not tested)  UPPER EXTREMITY MMT:     MMT Right eval Right 03/02/24 Left eval Left 03/02/24  Shoulder flexion 3-/5 3+/5 3-/5 4/5  Shoulder abduction 3-/5 3-/5 3-/5 4/5  Shoulder  adduction      Shoulder extension      Shoulder internal rotation      Shoulder external rotation      Middle trapezius      Lower trapezius      Elbow flexion 4-/5 4+/5 4+/5 5/5  Elbow extension 4-/5 4+/5 4+/5 5/5  Wrist flexion 4-/5 4-/5 3+/5 4/5  Wrist extension 4-/5 4-/5 3+/5 4/5  Wrist ulnar deviation      Wrist radial deviation      Wrist pronation      Wrist supination      (Blank rows = not tested)  HAND FUNCTION  Eval:  Grip strength: Right: N/T lbs; Left: 43 lbs, Lateral pinch: Right: N/T lbs, Left: 9 lbs, and 3 point pinch: Right: N/T lbs, Left: 5 lbs Pt. Presents with flexor tightness in the right 5th digit, however is able to passively extend 5th digit within normal range. Pt. Also Presents with hyper extension in the 2nd and 3rd digit at PIP joints. Pt. Is able to achieve full Digit MP, PIP, and DIP PROM.  01/16/24: Grip strength: Left: 33 lbs; Lateral pinching: Left: 8 lbs; 3 point pinch: Left: 3 lbs  03/02/24: Grip strength: Left: 36 lbs; Lateral pinching: Left: 8 lbs; 3 point pinch: Left: 3 lbs  COORDINATION: 9 Hole Peg test: Right: N/T sec; Left: 51 sec 01/16/24: Left: 1 min 1 sec 03/02/24:  Left: 1 min 1 sec   SENSATION: Not tested  EDEMA:   MUSCLE TONE: Fluctuating flexor tone in R hand.  COGNITION: Overall cognitive status: Within functional limits for tasks assessed  VISION: Subjective report:  Baseline vision:  Visual history:   VISION ASSESSMENT:  PERCEPTION:   PRAXIS:   OBSERVATIONS: Pt. presents with fluctuating flexor tone in the R hand, involving the 5th digit at the PIP/DIP joint. Pt. Presents with hyperextension at the 2nd and 3rd digits at the PIP joints.  TREATMENT DATE: 03/02/24  Measurements were obtained, and goals were reviewed with the Pt.    PATIENT EDUCATION: Education details: ROM,pen grasp, Nurse, adult Person educated: Patient Education method: Explanation, Demonstration, and Verbal cues Education comprehension: verbalized understanding, returned demonstration, verbal cues required, and needs further education  HOME EXERCISE PROGRAM: -Pink theraputty; visual handout issued -Self and caregiver assisted PROM to R hand and wrist -Monitor splint with skin checks   GOALS: Goals reviewed with patient? Yes  SHORT TERM GOALS: Target date: 04/13/2024    Pt. Will be independent with HEP for UE functioning. Baseline: Eval: No current HEP; 01/16/24: pt reports doing a little writing and squeezing a ball, but denies using putty; spouse stretches RUE daily 02/20/24:  Pt. Continues to do a little writing and squeezing a ball, but denies using putty; spouse stretches RUE 03/02/24: Pt. Husband assists Pt. With ROM to the right hand. Pt. reports doing hand exercises. Goal status:Ongoing  LONG TERM GOALS: Target date: 05/25/2024   Pt. Will improve [facility] skills to be able to manipulate small objects at home Baseline: 9 hole peg test: L: 51 seconds; 01/16/24: L 1 min 1 sec, 03/02/24: Left: 1 min. & 1 sec. Pt. Continues to work towards using the left to manipulate small objects. Goal status: Ongoing  2.  Pt. Will increase BUE shoulder ROM by 10 degrees to be able to reach into cabinets and shelves.  Baseline: Shoulder flexion: R: 94(121), L: 102(125), Shoulder abduction: R: 75(106), L: 110(126); 01/16/24: Shoulder flexion: R: 100 with scaption (132), L: 134 with scaption (145), Shoulder abduction: R: 74 (98), L: 140 (170)02/20/24: Pt. Is improving with functional reaching through multiple planes with cues, and assist required. 03/02/24: Shoulder flexion: R: 126(146), L: 142(148), Shoulder abduction: R: 93(120), L: WNL;  Goal status: In progress; Ongoing  3.  Pt. Will improve L grip strength by 5# to be able to securely grasp ADL items at home.  Baseline: Grip strength: R: NT, L: 43#; 01/16/24: L: 33#   03/02/24: 36# Goal status: In progress; Ongoing  4.  Pt. Will increase L lateral key pinch strength by 3#  to open wide mouth jars.  Baseline: Lateral key pinch: R: NT, L: 9#; 01/16/24: L: 8# 03/02/24: Pinch meter is out for calibration. Pt. Continues to have difficulty opening jars Goal status: Ongoing  5.  Pt. Will independently engage the right hand as a gross assist to the left hand 100% of the time during ADLs/IADLs. Baseline: Eval: Pt. With limited engagement of the right hand as a gross assist during tasks; 01/16/24: Observed <25% of the time during OT sessions 02/20/24: Pt. Continues to present with limited engagement of the RUE as a gross assist to the left hand approximately 25% of the time with cues. 03/02/24: Pt. Is starting to engage her right hand more as a gross assist to the left  Goal status: In progress; Ongoing  6.  Pt. Will be able to manipulate zippers, and buttons efficiently with modified independence Baseline: Eval:Right: NT Left: 51 sec; 01/16/24: Left: 1 min and 1 sec 02/20/24: Pt. continues to have difficulty manipulating zippers and buttons.03/02/24: Pt. continues to have difficulty manipulating zippers, and buttons, and may benefit from reviewing adaptive devices. Goal status: Ongoing   7. Pt. Will improve handwriting legibility to 100% for one sentence in printed form for written correspondence.  Baseline: 50% legible in printed form for first name only; 01/16/24: 80-90% legible to print first name 1 of 4 attempts.  3 of 4  attempts 50% or less legible 02/20/24: first name only 100%, 25% for lists of words. 03/02/24:  first name only 100%, 25% for lists of words.  Goal status: In progress; Ongoing  8. Pt. Will independently, and efficiently transfer small 1/2 objects from the left hand to the right hand in midline in preparation from reaching up  to place items at a target with 100% accuracy with the right hand.  Baseline: 03/02/24: Pt. Has progressed to transferring 1.5  objects between her hand in midline, and reach up to place them at targets with 60% accuracy  Goal Status: New 03/02/24  9. Pt. Will increased BUE strength by 2 mm grades to assist with ADLs, and IADLs.  Baseline: 03/02/24: UE strength: Shoulder flexion: R: 3+/5 L: 4/5, abduction: R: 3-/5, L 4/5:  elbow flexion: R:4+/5, L: 5/5, extension: R: 4+/5, L:5/5, wrist flexion: R: 4-/5  L: 4/5 wrist extension: R: 4-/5 L: 4/5  Goal Status: New: 03/02/24  ASSESSMENT: CLINICAL IMPRESSION:  Pt. has made progress this over this recertification period with bilateral shoulder ROM, grip strength, and engaging her right hand more as a gross assist to the left hand. Pt. has progressed with writing legibility. Pt. continues to present with difficulty fastening zippers, and buttons. New goals were added for improving BUE strength, as well as for transferring items from the left hand to the right hand I preparation for reaching up to place them accurately at targets with the RUE. Pt. continues to benefit from OT services to increase BUE ROM in the shoulder, wrist and hands, as well as increasing L grip/pinch strength and improve [facility] skills to be able to perform her desired ADL/IADLs.     PERFORMANCE DEFICITS: in functional skills including ADLs, IADLs, coordination, dexterity, edema, tone, ROM, strength, pain, Fine motor control, Gross motor control, endurance, and UE functional use, and psychosocial skills including coping strategies, environmental adaptation, habits, and routines and behaviors.   IMPAIRMENTS: are limiting patient from ADLs, IADLs, rest and sleep, leisure, and social participation.   CO-MORBIDITIES: may have co-morbidities  that affects occupational performance. Patient will benefit from skilled OT to address above impairments and improve overall function.  MODIFICATION OR ASSISTANCE TO COMPLETE EVALUATION: Min-Moderate modification of tasks or assist with assess necessary to complete an  evaluation.  OT OCCUPATIONAL PROFILE AND HISTORY: Detailed assessment: Review of records and additional review of physical, cognitive, psychosocial history related to current functional performance.  CLINICAL DECISION MAKING: Moderate - several treatment options, min-mod task modification necessary  REHAB POTENTIAL: Good  EVALUATION COMPLEXITY: Moderate    PLAN:  OT FREQUENCY: 2x/week  OT DURATION: 12 weeks  PLANNED INTERVENTIONS: 97168 OT Re-evaluation, 97535 self care/ADL training, 02889 therapeutic exercise, 97530 therapeutic activity, 97112 neuromuscular re-education, 97140 manual therapy, 97018 paraffin, 02960 fluidotherapy, 97010 moist heat, 97010 cryotherapy, 97034 contrast bath, 97032 electrical stimulation (manual), 97760 Orthotic Initial, passive range of motion, energy conservation, coping strategies training, patient/family education, and DME and/or AE instructions  RECOMMENDED OTHER SERVICES: PT   CONSULTED AND AGREED WITH PLAN OF CARE: Patient  PLAN FOR NEXT SESSION: Treatment  Richardson Otter, MS, OTR/L   03/02/2024, 2:54 PM

## 2024-03-04 ENCOUNTER — Ambulatory Visit

## 2024-03-04 ENCOUNTER — Encounter: Admitting: Speech Pathology

## 2024-03-04 DIAGNOSIS — M6281 Muscle weakness (generalized): Secondary | ICD-10-CM

## 2024-03-04 DIAGNOSIS — R269 Unspecified abnormalities of gait and mobility: Secondary | ICD-10-CM

## 2024-03-04 DIAGNOSIS — R278 Other lack of coordination: Secondary | ICD-10-CM

## 2024-03-04 DIAGNOSIS — R262 Difficulty in walking, not elsewhere classified: Secondary | ICD-10-CM

## 2024-03-04 DIAGNOSIS — R2689 Other abnormalities of gait and mobility: Secondary | ICD-10-CM

## 2024-03-04 DIAGNOSIS — R296 Repeated falls: Secondary | ICD-10-CM

## 2024-03-04 NOTE — Therapy (Signed)
 OUTPATIENT PHYSICAL THERAPY TREATMENT/RECERT  Patient Name: Erika Stanton MRN: [id number] DOB:[birth year], 68 y.o., female Today's Date: 03/04/2024  PCP: Dr. Oneil Galloway  REFERRING PROVIDER: Dr. Oneil Galloway  END OF SESSION:   PT End of Session - 03/04/24 1412     Visit Number 22    Number of Visits 46    Date for Recertification  05/27/24    Authorization Type [employer]    Progress Note Due on Visit 30    PT Start Time 1402    PT Stop Time 1440    PT Time Calculation (min) 38 min    Equipment Utilized During Treatment Gait belt    Activity Tolerance Patient tolerated treatment well;No increased pain    Behavior During Therapy WFL for tasks assessed/performed             Past Medical History:  Diagnosis Date   Anatomical narrow angle, bilateral    Aneurysm    Asthma    Cerebral aneurysm    Cerebral vasospasm    Cognitive change    Dysphagia, post-stroke    GERD (gastroesophageal reflux disease)    Hemorrhoids    History of cervical dysplasia    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 no recurrent polyps.  suggest repeat surveillance study 11/2022.     COLONOSCOPY W/ POLYPECTOMY  06/2014   Dr Elder at [facility].  3 adenomatoous polyps, anal fissure.     ESOPHAGOGASTRODUODENOSCOPY (EGD) WITH PROPOFOL  N/A 01/27/2020   Procedure: ESOPHAGOGASTRODUODENOSCOPY (EGD) WITH PROPOFOL ;  Surgeon: Shila Gustav GAILS, MD;  Location: [facility] ENDOSCOPY;  Service: Endoscopy;  Laterality: N/A;   INSERTION OF PERMANENT INTRAPERITONEAL CANNULA/CATH N/A     LAPAROSCOPIC   IR 3D INDEPENDENT WKST  01/06/2020   IR ANGIO INTRA EXTRACRAN SEL INTERNAL CAROTID BILAT MOD SED  01/06/2020   IR ANGIO VERTEBRAL SEL VERTEBRAL UNI L MOD SED  01/06/2020   IR ANGIOGRAM FOLLOW UP STUDY  01/06/2020   IR ANGIOGRAM FOLLOW UP STUDY  01/06/2020   IR ANGIOGRAM FOLLOW UP STUDY  01/06/2020   IR ANGIOGRAM FOLLOW UP STUDY  01/06/2020   IR ANGIOGRAM FOLLOW UP STUDY  01/06/2020   IR ANGIOGRAM FOLLOW UP STUDY  01/06/2020   IR ANGIOGRAM FOLLOW UP STUDY  01/06/2020   IR ANGIOGRAM FOLLOW UP STUDY  01/06/2020   IR ANGIOGRAM FOLLOW UP STUDY  01/06/2020   IR ANGIOGRAM FOLLOW UP STUDY  01/06/2020   IR NEURO EACH ADD'L AFTER BASIC UNI RIGHT (MS)  01/06/2020   IR TRANSCATH/EMBOLIZ  01/06/2020   MASTECTOMY     MOHS SURGERY N/A    RADIOLOGY WITH ANESTHESIA N/A 01/06/2020   Procedure: IR WITH ANESTHESIA FOR ANEURYSM;  Surgeon: Lanis Pupa, MD;  Location: [facility] OR;  Service: Radiology;  Laterality: N/A;   STENT SUPPORTED EMBOLIZATION OF RIGHT ICA ANEURYSM Right    TUBAL LIGATION     VENTRICULO-PERITONEAL SHUNT PLACEMENT / LAPAROSCOPIC INSERTION PERITONEAL CATHETER N/A    Patient Active Problem List  Diagnosis Date Noted   Dyspareunia in female 04/05/2020   History of abnormal cervical Pap smear 03/10/2020   GIB (gastrointestinal bleeding) 02/28/2020   Elevated BUN    Prediabetes    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   Cerebral vasospasm    Sinus tachycardia    Dysphagia, post-stroke    Thrombocytopenia    Acute blood loss anemia    Brain aneurysm    ICH (intracerebral hemorrhage) (HCC) 01/05/2020   History of adenomatous polyp of colon 02/07/2016   Anatomical narrow angle, bilateral 12/14/2014   Fissure in ano 11/11/2014   Hemorrhoids 11/11/2014   Constipation 11/19/2013   GERD (gastroesophageal reflux disease) 03/24/2013   Irritable bowel syndrome with diarrhea 03/24/2013   Herpes zoster 05/14/2005   Abnormal Pap smear of cervix 05/15/1983   History  of cervical dysplasia 05/15/1983    ONSET DATE: 12/02/2023  REFERRING DIAG: I63.9 (ICD-10-CM) - CVA (cerebral vascular accident) (HCC)   THERAPY DIAG:  Muscle weakness (generalized) - Plan: PT plan of care cert/re-cert  Other lack of coordination - Plan: PT plan of care cert/re-cert  Abnormality of gait and mobility - Plan: PT plan of care cert/re-cert  Difficulty in walking, not elsewhere classified - Plan: PT plan of care cert/re-cert  Repeated falls - Plan: PT plan of care cert/re-cert  Other abnormalities of gait and mobility - Plan: PT plan of care cert/re-cert  Rationale for Evaluation and Treatment: Rehabilitation  SUBJECTIVE:                                                                                                                                                                                             SUBJECTIVE STATEMENT:  Pt says she is doing fine today. Asking is insurance approved more visits.   PERTINENT HISTORY:  Patient reports she was in hospital from [facility]- Sat (July 21st- 25th) with CVA affecting left side. I was supposed to have a knee replacement surgery in September but that is on hold for now. I was doing my prep for my colonoscopy and fell in the bathroom.  Patient reports having mild left sided weakness compared to her CVA in 2021 with heavy Right sided UE weakness. She reports bad OA affecting both knees as well. Previous CVA in 2021 affecting Right side. Reports going to ED  last Monday due to fall/CVA affecting Left side. Diagnosed with R CVA with Left Sided weakness.    PAIN:  Are you having pain? 3/10 bilat knees   PRECAUTIONS: Fall  WEIGHT BEARING RESTRICTIONS: No  FALLS: Has patient fallen in last 6 months? Yes. Number of falls fell with the stroke  PATIENT GOALS: Walk without any assistive device.  OBJECTIVE:  Note: Objective measures were completed at evaluation unless otherwise noted.                                                                                                                               TREATMENT DATE : 03/04/2024   Self care/Home management -Spent significant time explaining difference between progress report and a recertification. Patient in agreement with plan for recert ultimately. She did not have any new goals and agreeable to continue to work on her functional LE strength, endurance, and balance. Discussed that she lacks confidence and needed to practice more and stay compliant with HEP.   TA:  Nustep - interval training to promote LE strength/cardioresp endurance. PT sets up intervention, adjusts intensity throughout and monitors pt for response. Cuing for SPM/speed, pt maintains SPM in 50s  x 6 min with (1st 2 min RUE applied with patient attempting to hold grip with only CGA)  -AMB in clinic with use of SPC-x approx 500 feet with 2 standing rest breaks -STS technique including scooting and anterior weight shift x 5  Pt performed 5 time sit<>stand (5xSTS): more difficulty raising without UE but performed 18.75 sec with LUE Support sec (>15 sec indicates increased fall risk)   PATIENT EDUCATION: Education details: PT plan of care; Discussion of symptoms of CVA and to go to ED as soon as possible. Purpose of PT for balance, strength, coordination and endurance.  Person educated: Patient Education method: Explanation Education comprehension: verbalized understanding  HOME EXERCISE PROGRAM: Access Code: [email or web address] URL: [email or web address] Date: 12/17/2023 Prepared by: Sidra Simpers  Program Notes **Be sure to perform all exercises next to a countertop or sturdy piece of furniture in case you become unsteady.  Exercises - Standing Heel Raise with Support  - 1 x daily - 7 x weekly - 2 sets - 15 reps - Standing Toe Raises at Chair  - 1 x daily - 7 x weekly - 2 sets - 15 reps - Mini Squat with Counter Support  - 1 x daily - 7 x weekly - 2 sets - 15 reps - Lunge with Counter  Support  - 1 x daily - 7 x weekly - 2 sets - 15 reps - Standing Tandem Balance with Counter Support  - 1 x daily - 7 x weekly - 2 sets - 2 reps - 30 hold - Standing Single Leg Stance with Counter Support  - 1 x daily - 7 x weekly - 2 sets - 2 reps - 30 hold  GOALS: Goals reviewed with patient? Yes  SHORT TERM GOALS: Target date: 01/21/2024  Pt will be independent with HEP in order to improve strength and balance in order to decrease fall risk and improve function at home.  Baseline: EVAL - No current formal HEP in place; 03/04/2024- Patient reports independent with all current HEP and riding a stationary bike.  Goal status: MET  LONG TERM GOALS: Target date: 03/03/2024  1.  Patient will complete five times sit to stand test in < 15 seconds indicating an increased LE strength and improved balance. Baseline: EVAL= 25.20 sec with LUE Support 9/2: 23.36 without UE support and CGA-min assist. 20.10 sec with LUE support; 03/04/2024- more difficulty raising without UE but performed 18.75 sec with LUE Support Goal status: INITIAL  2.  Patient will increase Berg Balance score by > 6 points to demonstrate decreased fall risk during functional activities. Baseline: EVAL: To be assessed next visit; 12/12/2023= 42/56 9/2: 44/56  10/16:45/56 Goal status: PROGRESSING   3.  Patient will reduce timed up and go to <11 seconds to reduce fall risk and demonstrate improved transfer/gait ability. Baseline: EVAL = 40 sec with RW 9/2: 18.33sec with SPC 10/16:17.47 sec with SPC Goal status: PROGRESSING  4.  Patient will increase 10 meter walk test to >1.38m/s as to improve gait speed for better community ambulation and to reduce fall risk. Baseline: EVAL: 0.65 m/s 9/2: 0.67 m/s with [facility] 9/9: 0.71 m/s without an AD and wearing 2.5 # AW 10/16: .75 m/s with SPC Goal status: PROGRESSING  5. Patient will increase six minute walk test distance by 200 feet for progression to community ambulator and improve gait  ability Baseline: EVAL= 482' 9/2: 465 feet  using a SPC with CGA required seated rest break at 4:75min  10/16: 542 ft and full 6 minutes  Goal status: INITIAL   ASSESSMENT:  CLINICAL IMPRESSION:  Patient presents with good motivation for recert visit today. She just recently completed goal assessment so did not reassess again today. She continues to present chronic knee pain and limited functional endurance and required some rest breaks and increased encouragement. She has made steady progress as seen by last values from progress report x 2 visits ago. Patient's condition has the potential to improve in response to therapy. Maximum improvement is yet to be obtained. The anticipated improvement is attainable and reasonable in a generally predictable time.  Patient reports   Pt will continue to benefit from skilled physical therapy intervention to address impairments, improve QOL, and attain therapy goals.     OBJECTIVE IMPAIRMENTS: Abnormal gait, decreased activity tolerance, decreased balance, decreased coordination, decreased endurance, decreased mobility, difficulty walking, decreased ROM, decreased strength, impaired UE functional use, and pain.   ACTIVITY LIMITATIONS: carrying, lifting, bending, sitting, standing, squatting, sleeping, stairs, and transfers  PARTICIPATION LIMITATIONS: meal prep, cleaning, laundry, driving, shopping, community activity, and yard work  PERSONAL FACTORS: 1-2 comorbidities: OA, previous CVA with significant R sided weakness are also affecting patient's functional outcome.   REHAB POTENTIAL: Good  CLINICAL DECISION MAKING: Stable/uncomplicated  EVALUATION COMPLEXITY: Moderate  PLAN:  PT FREQUENCY: 1-2x/week  PT DURATION: 12 weeks  PLANNED INTERVENTIONS: 97164- PT Re-evaluation, 97750- Physical Performance Testing, 97110-Therapeutic exercises, 97530- Therapeutic activity, [id number]- Neuromuscular re-education, 97535- Self Care, [id number]- Manual therapy, [id number]-  Gait training, [phone]- Orthotic Initial, [phone]- Orthotic/Prosthetic subsequent, [phone]- Canalith repositioning, [phone]- Electrical stimulation (manual), [phone] (1-2 muscles), [id number] (3+ muscles)- Dry Needling, Patient/Family education, Balance training, Stair training, Taping, Joint mobilization, Joint manipulation, Spinal manipulation, Spinal mobilization, Compression bandaging, Vestibular training, DME instructions, Cryotherapy, and Moist heat  PLAN FOR NEXT SESSION:  Distance walking tolerance Curb step navigation training  BLE functional strengthening Sit<>stands Step-ups  Gait training with LRAD with increased activity tolerance through hall of hospital to reduce self limiting behavior.  Decrease seated rest breaks for improved endurance  2:57 PM, 03/04/24 Chyrl London, PT Physical Therapist -  [facility]  Outpatient Physical Therapy- Main Campus [phone]

## 2024-03-04 NOTE — Therapy (Signed)
 Outpatient Occupational Therapy Neuro Treatment Note   Patient Name: Erika Stanton MRN: [id number] DOB:February 04, 1956, 68 y.o., female Today's Date: 03/08/2024  PCP: Laurice Anes, MD REFERRING PROVIDER: Laurice Anes, MD   OT End of Session - 03/08/24 2016     Visit Number 23    Number of Visits 48    Date for Recertification  05/25/24    OT Start Time 1315    OT Stop Time 1400    OT Time Calculation (min) 45 min    Equipment Utilized During Treatment transport chair    Activity Tolerance Patient tolerated treatment well    Behavior During Therapy WFL for tasks assessed/performed         Past Medical History:  Diagnosis Date   Anatomical narrow angle, bilateral    Aneurysm    Asthma    Cerebral aneurysm    Cerebral vasospasm    Cognitive change    Dysphagia, post-stroke    GERD (gastroesophageal reflux disease)    Hemorrhoids    History of cervical dysplasia    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 no recurrent polyps.  suggest repeat surveillance study 11/2022.     COLONOSCOPY W/ POLYPECTOMY  06/2014   Dr Elder at [facility].  3 adenomatoous polyps, anal fissure.     ESOPHAGOGASTRODUODENOSCOPY (EGD) WITH PROPOFOL  N/A 01/27/2020   Procedure: ESOPHAGOGASTRODUODENOSCOPY (EGD) WITH PROPOFOL ;  Surgeon: Shila Gustav GAILS, MD;  Location: [facility] ENDOSCOPY;  Service: Endoscopy;  Laterality: N/A;   INSERTION OF PERMANENT INTRAPERITONEAL CANNULA/CATH N/A    LAPAROSCOPIC   IR 3D INDEPENDENT WKST  01/06/2020   IR ANGIO INTRA EXTRACRAN SEL INTERNAL CAROTID  BILAT MOD SED  01/06/2020   IR ANGIO VERTEBRAL SEL VERTEBRAL UNI L MOD SED  01/06/2020   IR ANGIOGRAM FOLLOW UP STUDY  01/06/2020   IR ANGIOGRAM FOLLOW UP STUDY  01/06/2020   IR ANGIOGRAM FOLLOW UP STUDY  01/06/2020   IR ANGIOGRAM FOLLOW UP STUDY  01/06/2020   IR ANGIOGRAM FOLLOW UP STUDY  01/06/2020   IR ANGIOGRAM FOLLOW UP STUDY  01/06/2020   IR ANGIOGRAM FOLLOW UP STUDY  01/06/2020   IR ANGIOGRAM FOLLOW UP STUDY  01/06/2020   IR ANGIOGRAM FOLLOW UP STUDY  01/06/2020   IR ANGIOGRAM FOLLOW UP STUDY  01/06/2020   IR NEURO EACH ADD'L AFTER BASIC UNI RIGHT (MS)  01/06/2020   IR TRANSCATH/EMBOLIZ  01/06/2020   MASTECTOMY     MOHS SURGERY N/A    RADIOLOGY WITH ANESTHESIA N/A 01/06/2020   Procedure: IR WITH ANESTHESIA FOR ANEURYSM;  Surgeon: Lanis Pupa, MD;  Location: [facility] OR;  Service: Radiology;  Laterality: N/A;   STENT SUPPORTED EMBOLIZATION OF RIGHT ICA ANEURYSM Right    TUBAL LIGATION     VENTRICULO-PERITONEAL SHUNT PLACEMENT / LAPAROSCOPIC INSERTION PERITONEAL CATHETER N/A    Patient Active Problem List   Diagnosis Date Noted   Dyspareunia in female 04/05/2020   History of abnormal cervical Pap smear 03/10/2020   GIB (  gastrointestinal bleeding) 02/28/2020   Elevated BUN    Prediabetes    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   Cerebral vasospasm    Sinus tachycardia    Dysphagia, post-stroke    Thrombocytopenia    Acute blood loss anemia    Brain aneurysm    ICH (intracerebral hemorrhage) (HCC) 01/05/2020   History of adenomatous polyp of colon 02/07/2016   Anatomical narrow angle, bilateral 12/14/2014   Fissure in ano 11/11/2014   Hemorrhoids 11/11/2014   Constipation 11/19/2013   GERD (gastroesophageal reflux disease) 03/24/2013   Irritable bowel syndrome with diarrhea 03/24/2013   Herpes zoster 05/14/2005   Abnormal Pap smear of cervix 05/15/1983   History of cervical dysplasia 05/15/1983   ONSET DATE: 12/02/2023  REFERRING DIAG: L ischemic  CVA  THERAPY DIAG:  Muscle weakness (generalized)  Other lack of coordination  Rationale for Evaluation and Treatment: Rehabilitation  SUBJECTIVE:  SUBJECTIVE STATEMENT: Pt reports doing well today.  PERTINENT HISTORY:  Pt. was [facility] to [facility] from 12/02/23-12/06/23 with an Acute Right MCA CVA, Hydrocephalus with shunt  placement. History of Subarachnoid Hemorrhage with bilateral ICA coiled aneurysms. 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  PRECAUTIONS: None  WEIGHT BEARING RESTRICTIONS: No  02/18/24: PAIN 03/04/24: 3/10 pain bilat knees 02/28/24: No pain 02/18/24: No pain  02/05/24: 4/10 bilat knees Are you having pain? No  FALLS: Has patient fallen in last 6 months? Yes. Number of falls 1  LIVING ENVIRONMENT: Lives with: lives with their family and lives with their spouse Lives in: House/apartment Stairs: hand rails, 2 steps   Has following equipment at home: [name], cane  PLOF: Independent with basic ADLs, Spouse help as needed  PATIENT GOALS: Strengthening  OBJECTIVE:  Note: Objective measures were completed at Evaluation unless otherwise noted.  HAND DOMINANCE: Left  ADLs: Overall ADLs: spouse assists when needed, Pt. reports that she can do most tasks herself. Uses her left hand to engage in self-care tasks. Eating: Able to cut foods with a fork, husband assists as needed.  Grooming: husband assists with applying toothpaste, able to brush teeth and hair UB Dressing: Spouse assists with applying/fastening bra, Pt. Requires assistance with zippers, and buttons. LB Dressing: Spouse assists with tying shoes when needed, uses slip on shoes. Pt. Does not zip pants. Toileting: Able to complete  independently Bathing: Pt. Requires intermittent assistance with bathing tasks. Tub Shower transfers: Spouse assists with transfers when/if needed Equipment: Shower seat with back  IADLs: Shopping: Spouse typically does the shopping, isnt able to carry heavy objects.  Light housekeeping: Spouse typically does most of the cleaning around the home, Pt. Cleans bathroom, and laundry Pt. Is unable to carry laundry due to exacerbation of weakness.  Meal Prep: Spouse typically cooks and prepares meals.  Community mobility: Pt. Requires assistance using walker. Pt. reports that she is unable to carry heavy items, and typically does not do the shopping.  Medication management: Pt. is able to take care of meds with pillbox Financial management:  Spouse takes care of it, and makes online payments.  Handwriting: 50% legible  MOBILITY STATUS: Needs Assist: Pt. Requires use of a walker   POSTURE COMMENTS:  No Significant postural limitations Sitting balance: Good  ACTIVITY TOLERANCE: Activity tolerance:   FUNCTIONAL OUTCOME MEASURES: TBD  UPPER EXTREMITY ROM:    Active ROM Right eval Right 01/16/24 Right 03/02/24 Left eval Left 01/16/24 Left 03/02/24  Shoulder flexion 94(121) 100 with scaption (132) 126(146) 102(125) 134 with scaption (145) 142(148)  Shoulder abduction 75(106) 74 (98) 93(120) 110(126) 140 (170) WNL  Shoulder adduction        Shoulder extension        Shoulder internal rotation        Shoulder external rotation        Elbow flexion 135(155)  150(155) 140(150)  150  Elbow extension 0  0 -10(-9)  -4(0)  Wrist flexion 40(44)  48(60) 42(58)  52(60)  Wrist extension 30(34)  48(60) 40(50)  52(56)  Wrist ulnar deviation        Wrist radial deviation        Wrist pronation        Wrist supination        (Blank rows = not tested)  UPPER EXTREMITY MMT:     MMT Right eval Right 03/02/24 Left eval Left 03/02/24  Shoulder flexion 3-/5 3+/5 3-/5 4/5  Shoulder abduction 3-/5  3-/5 3-/5 4/5  Shoulder adduction      Shoulder extension      Shoulder internal rotation      Shoulder external rotation      Middle trapezius      Lower trapezius      Elbow flexion 4-/5 4+/5 4+/5 5/5  Elbow extension 4-/5 4+/5 4+/5 5/5  Wrist flexion 4-/5 4-/5 3+/5 4/5  Wrist extension 4-/5 4-/5 3+/5 4/5  Wrist ulnar deviation      Wrist radial deviation      Wrist pronation      Wrist supination      (Blank rows = not tested)  HAND FUNCTION Eval:  Grip strength: Right: N/T lbs; Left: 43 lbs, Lateral pinch: Right: N/T lbs, Left: 9 lbs, and 3 point pinch: Right: N/T lbs, Left: 5 lbs Pt. Presents with flexor tightness in the right 5th digit, however is able to passively extend 5th digit within normal range. Pt. Also Presents with hyper extension in the 2nd and 3rd digit at PIP joints. Pt. Is able to achieve full Digit MP, PIP, and DIP PROM.  01/16/24: Grip strength: Left: 33 lbs; Lateral pinching: Left: 8 lbs; 3 point pinch: Left: 3 lbs  03/02/24: Grip strength: Left: 36 lbs; Lateral pinching: Left: 8 lbs; 3 point pinch: Left: 3 lbs  COORDINATION: 9 Hole Peg test: Right: N/T sec; Left: 51 sec 01/16/24: Left: 1 min 1 sec 03/02/24:  Left: 1 min 1 sec   SENSATION: Not tested  EDEMA:   MUSCLE TONE: Fluctuating flexor tone in R hand.  COGNITION: Overall cognitive status: Within functional limits for tasks assessed  VISION: Subjective report:  Baseline vision:  Visual history:   VISION ASSESSMENT:  PERCEPTION:   PRAXIS:   OBSERVATIONS: Pt. presents with fluctuating flexor tone in the R hand, involving the 5th digit at the PIP/DIP joint. Pt. Presents with hyperextension at the 2nd and 3rd digits at the PIP joints.  TREATMENT DATE: 03/04/24 RUE positioned on pillow at table top with moist heat around the hand for tone reduction; used through most of session  simultaneous to activities noted below.  Therapeutic Exercise: -Facilitated L hand grip strengthening working to remove jumbo pegs from pegboard with hand gripper set at 11.2# for 3 trials.  Intermittent tc/vc for adjustment of hand gripper in hand to promote optimal performance.  Therapeutic Activity: -Facilitated L hand FMC/dexterity skills working to pick up poker chips 1 by 1, store up to 2 in hand, and perform translatory skills to move chips from palm to fingertips in prep to push chips through slotted jar. Worked with multiple dice, scooping multiple into palm, picking up 1 by 1, storing up to 3-4 in hand, and discarding 1 by 1 into container.  -Upgraded FMC/dexterity skills with smaller manipulative, placing small pegs into pegboard with L hand, 1 by 1.  Vc to downgrade slightly by alternating every other hole d/t side by side placement observed to be too challenging.   PATIENT EDUCATION: Education details: L hand FMC/dexterity skills/strengthening Person educated: Patient Education method: Explanation, Demonstration, and Verbal cues Education comprehension: verbalized understanding, returned demonstration, verbal cues required, and needs further education  HOME EXERCISE PROGRAM: -Pink theraputty; visual handout issued -Self and caregiver assisted PROM to R hand and wrist -Monitor splint with skin checks  GOALS: Goals reviewed with patient? Yes  SHORT TERM GOALS: Target date: 04/13/2024    Pt. Will be independent with HEP for UE functioning. Baseline: Eval: No current HEP; 01/16/24: pt reports doing a little writing and squeezing a ball, but denies using putty; spouse stretches RUE daily 02/20/24:  Pt. Continues to do a little writing and squeezing a ball, but denies using putty; spouse stretches RUE 03/02/24: Pt. Husband assists Pt. With ROM to the right hand. Pt. reports doing hand exercises. Goal status:Ongoing  LONG TERM GOALS: Target date: 05/25/2024   Pt. Will improve [facility]  skills to be able to manipulate small objects at home Baseline: 9 hole peg test: L: 51 seconds; 01/16/24: L 1 min 1 sec, 03/02/24: Left: 1 min. & 1 sec. Pt. Continues to work towards using the left to manipulate small objects. Goal status: Ongoing  2.  Pt. Will increase BUE shoulder ROM by 10 degrees to be able to reach into cabinets and shelves.  Baseline: Shoulder flexion: R: 94(121), L: 102(125), Shoulder abduction: R: 75(106), L: 110(126); 01/16/24: Shoulder flexion: R: 100 with scaption (132), L: 134 with scaption (145), Shoulder abduction: R: 74 (98), L: 140 (170)02/20/24: Pt. Is improving with functional reaching through multiple planes with cues, and assist required. 03/02/24: Shoulder flexion: R: 126(146), L: 142(148), Shoulder abduction: R: 93(120), L: WNL;  Goal status: In progress; Ongoing  3.  Pt. Will improve L grip strength by 5# to be able to securely grasp ADL items at home.  Baseline: Grip strength: R: NT, L: 43#; 01/16/24: L: 33#  03/02/24: 36# Goal status: In progress; Ongoing  4.  Pt. Will increase L lateral key pinch strength by 3#  to open wide mouth jars.  Baseline: Lateral key pinch: R: NT, L: 9#; 01/16/24: L: 8# 03/02/24: Pinch meter is out for calibration. Pt. Continues to have difficulty opening jars Goal status: Ongoing  5.  Pt. Will independently engage the right hand as a gross assist to the left hand 100% of the time during ADLs/IADLs. Baseline: Eval: Pt. With limited engagement of the right hand as a gross assist during tasks; 01/16/24: Observed <25% of  the time during OT sessions 02/20/24: Pt. Continues to present with limited engagement of the RUE as a gross assist to the left hand approximately 25% of the time with cues. 03/02/24: Pt. Is starting to engage her right hand more as a gross assist to the left  Goal status: In progress; Ongoing  6.  Pt. Will be able to manipulate zippers, and buttons efficiently with modified independence Baseline: Eval:Right: NT Left: 51  sec; 01/16/24: Left: 1 min and 1 sec 02/20/24: Pt. continues to have difficulty manipulating zippers and buttons.03/02/24: Pt. continues to have difficulty manipulating zippers, and buttons, and may benefit from reviewing adaptive devices. Goal status: Ongoing   7. Pt. Will improve handwriting legibility to 100% for one sentence in printed form for written correspondence.  Baseline: 50% legible in printed form for first name only; 01/16/24: 80-90% legible to print first name 1 of 4 attempts.  3 of 4 attempts 50% or less legible 02/20/24: first name only 100%, 25% for lists of words. 03/02/24:  first name only 100%, 25% for lists of words.  Goal status: In progress; Ongoing  8. Pt. Will independently, and efficiently transfer small 1/2 objects from the left hand to the right hand in midline in preparation from reaching up  to place items at a target with 100% accuracy with the right hand.  Baseline: 03/02/24: Pt. Has progressed to transferring 1.5 objects between her hand in midline, and reach up to place them at targets with 60% accuracy  Goal Status: New 03/02/24  9. Pt. Will increased BUE strength by 2 mm grades to assist with ADLs, and IADLs.  Baseline: 03/02/24: UE strength: Shoulder flexion: R: 3+/5 L: 4/5, abduction: R: 3-/5, L 4/5:  elbow flexion: R:4+/5, L: 5/5, extension: R: 4+/5, L:5/5, wrist flexion: R: 4-/5  L: 4/5 wrist extension: R: 4-/5 L: 4/5  Goal Status: New: 03/02/24  ASSESSMENT: CLINICAL IMPRESSION: Pt able to manage 3 trials on hand gripper today set at 11.2#, requiring occasional vc/tactile cue to adjust hand position in hand gripper for optimal performance. Pt demonstrated good ability to manipulate poker chips and dice with item storage and translatory skills, but showed increased challenge with small pegs into pegboard.  Small peg activity downgraded to pt placing pegs into every other hole rather than side by side to allow increased room for error.  Pt. continues to benefit  from OT services to increase BUE ROM in the shoulder, wrist and hands, as well as increasing L grip/pinch strength and improve [facility] skills to be able to perform her desired ADL/IADLs.    PERFORMANCE DEFICITS: in functional skills including ADLs, IADLs, coordination, dexterity, edema, tone, ROM, strength, pain, Fine motor control, Gross motor control, endurance, and UE functional use, and psychosocial skills including coping strategies, environmental adaptation, habits, and routines and behaviors.   IMPAIRMENTS: are limiting patient from ADLs, IADLs, rest and sleep, leisure, and social participation.   CO-MORBIDITIES: may have co-morbidities  that affects occupational performance. Patient will benefit from skilled OT to address above impairments and improve overall function.  MODIFICATION OR ASSISTANCE TO COMPLETE EVALUATION: Min-Moderate modification of tasks or assist with assess necessary to complete an evaluation.  OT OCCUPATIONAL PROFILE AND HISTORY: Detailed assessment: Review of records and additional review of physical, cognitive, psychosocial history related to current functional performance.  CLINICAL DECISION MAKING: Moderate - several treatment options, min-mod task modification necessary  REHAB POTENTIAL: Good  EVALUATION COMPLEXITY: Moderate  PLAN:  OT FREQUENCY: 2x/week  OT DURATION: 12 weeks  PLANNED INTERVENTIONS: 97168 OT Re-evaluation, 97535 self care/ADL training, 02889 therapeutic exercise, 97530 therapeutic activity, 97112 neuromuscular re-education, 97140 manual therapy, 97018 paraffin, 02960 fluidotherapy, 97010 moist heat, 97010 cryotherapy, 97034 contrast bath, 97032 electrical stimulation (manual), 97760 Orthotic Initial, passive range of motion, energy conservation, coping strategies training, patient/family education, and DME and/or AE instructions  RECOMMENDED OTHER SERVICES: PT   CONSULTED AND AGREED WITH PLAN OF CARE: Patient  PLAN FOR NEXT SESSION:  Treatment  Inocente Blazing, MS, OTR/L   03/08/2024, 8:18 PM

## 2024-03-09 ENCOUNTER — Encounter

## 2024-03-09 ENCOUNTER — Ambulatory Visit: Admitting: Physical Therapy

## 2024-03-09 ENCOUNTER — Encounter: Admitting: Speech Pathology

## 2024-03-10 ENCOUNTER — Ambulatory Visit: Admitting: Occupational Therapy

## 2024-03-10 ENCOUNTER — Ambulatory Visit

## 2024-03-10 DIAGNOSIS — M6281 Muscle weakness (generalized): Secondary | ICD-10-CM

## 2024-03-10 DIAGNOSIS — R2689 Other abnormalities of gait and mobility: Secondary | ICD-10-CM

## 2024-03-10 DIAGNOSIS — R41841 Cognitive communication deficit: Secondary | ICD-10-CM

## 2024-03-10 DIAGNOSIS — R269 Unspecified abnormalities of gait and mobility: Secondary | ICD-10-CM

## 2024-03-10 DIAGNOSIS — R278 Other lack of coordination: Secondary | ICD-10-CM

## 2024-03-10 DIAGNOSIS — R296 Repeated falls: Secondary | ICD-10-CM

## 2024-03-10 DIAGNOSIS — R262 Difficulty in walking, not elsewhere classified: Secondary | ICD-10-CM

## 2024-03-10 NOTE — Therapy (Addendum)
 Outpatient Occupational Therapy Neuro Treatment Note   Patient Name: Erika Stanton MRN: [id number] DOB:09-30-1955, 68 y.o., female Today's Date: 03/10/2024  PCP: Laurice Anes, MD REFERRING PROVIDER: Laurice Anes, MD   OT End of Session - 03/10/24 1601     Visit Number 24    Number of Visits 48    Date for Recertification  05/25/24    OT Start Time 1145    OT Stop Time 1230    OT Time Calculation (min) 45 min    Equipment Utilized During Treatment transport chair    Activity Tolerance Patient tolerated treatment well    Behavior During Therapy WFL for tasks assessed/performed         Past Medical History:  Diagnosis Date   Anatomical narrow angle, bilateral    Aneurysm    Asthma    Cerebral aneurysm    Cerebral vasospasm    Cognitive change    Dysphagia, post-stroke    GERD (gastroesophageal reflux disease)    Hemorrhoids    History of cervical dysplasia    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 no recurrent polyps.  suggest repeat surveillance study 11/2022.     COLONOSCOPY W/ POLYPECTOMY  06/2014   Dr Elder at [facility].  3 adenomatoous polyps, anal fissure.     ESOPHAGOGASTRODUODENOSCOPY (EGD) WITH PROPOFOL  N/A 01/27/2020   Procedure: ESOPHAGOGASTRODUODENOSCOPY (EGD) WITH PROPOFOL ;  Surgeon: Shila Gustav GAILS, MD;  Location: [facility] ENDOSCOPY;  Service: Endoscopy;  Laterality: N/A;   INSERTION OF PERMANENT INTRAPERITONEAL CANNULA/CATH N/A    LAPAROSCOPIC   IR 3D INDEPENDENT WKST  01/06/2020   IR ANGIO INTRA EXTRACRAN SEL INTERNAL CAROTID  BILAT MOD SED  01/06/2020   IR ANGIO VERTEBRAL SEL VERTEBRAL UNI L MOD SED  01/06/2020   IR ANGIOGRAM FOLLOW UP STUDY  01/06/2020   IR ANGIOGRAM FOLLOW UP STUDY  01/06/2020   IR ANGIOGRAM FOLLOW UP STUDY  01/06/2020   IR ANGIOGRAM FOLLOW UP STUDY  01/06/2020   IR ANGIOGRAM FOLLOW UP STUDY  01/06/2020   IR ANGIOGRAM FOLLOW UP STUDY  01/06/2020   IR ANGIOGRAM FOLLOW UP STUDY  01/06/2020   IR ANGIOGRAM FOLLOW UP STUDY  01/06/2020   IR ANGIOGRAM FOLLOW UP STUDY  01/06/2020   IR ANGIOGRAM FOLLOW UP STUDY  01/06/2020   IR NEURO EACH ADD'L AFTER BASIC UNI RIGHT (MS)  01/06/2020   IR TRANSCATH/EMBOLIZ  01/06/2020   MASTECTOMY     MOHS SURGERY N/A    RADIOLOGY WITH ANESTHESIA N/A 01/06/2020   Procedure: IR WITH ANESTHESIA FOR ANEURYSM;  Surgeon: Lanis Pupa, MD;  Location: [facility] OR;  Service: Radiology;  Laterality: N/A;   STENT SUPPORTED EMBOLIZATION OF RIGHT ICA ANEURYSM Right    TUBAL LIGATION     VENTRICULO-PERITONEAL SHUNT PLACEMENT / LAPAROSCOPIC INSERTION PERITONEAL CATHETER N/A    Patient Active Problem List   Diagnosis Date Noted   Dyspareunia in female 04/05/2020   History of abnormal cervical Pap smear 03/10/2020   GIB (  gastrointestinal bleeding) 02/28/2020   Elevated BUN    Prediabetes    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   Cerebral vasospasm    Sinus tachycardia    Dysphagia, post-stroke    Thrombocytopenia    Acute blood loss anemia    Brain aneurysm    ICH (intracerebral hemorrhage) (HCC) 01/05/2020   History of adenomatous polyp of colon 02/07/2016   Anatomical narrow angle, bilateral 12/14/2014   Fissure in ano 11/11/2014   Hemorrhoids 11/11/2014   Constipation 11/19/2013   GERD (gastroesophageal reflux disease) 03/24/2013   Irritable bowel syndrome with diarrhea 03/24/2013   Herpes zoster 05/14/2005   Abnormal Pap smear of cervix 05/15/1983   History of cervical dysplasia 05/15/1983   ONSET DATE: 12/02/2023  REFERRING DIAG: L ischemic  CVA  THERAPY DIAG:  Muscle weakness (generalized)  Rationale for Evaluation and Treatment: Rehabilitation  SUBJECTIVE:  SUBJECTIVE STATEMENT: Pt. reports doing well today.  PERTINENT HISTORY:  Pt. was [facility] from 12/02/23-12/06/23 with an Acute Right MCA CVA, Hydrocephalus with shunt  placement. History of Subarachnoid Hemorrhage with bilateral ICA coiled aneurysms. 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  PRECAUTIONS: None  WEIGHT BEARING RESTRICTIONS: No  02/18/24: PAIN   03/10/24: No pain 03/04/24: 3/10 pain bilat knees 02/28/24: No pain 02/18/24: No pain  02/05/24: 4/10 bilat knees Are you having pain? No  FALLS: Has patient fallen in last 6 months? Yes. Number of falls 1  LIVING ENVIRONMENT: Lives with: lives with their family and lives with their spouse Lives in: House/apartment Stairs: hand rails, 2 steps   Has following equipment at home: [name], cane  PLOF: Independent with basic ADLs, Spouse help as needed  PATIENT GOALS: Strengthening  OBJECTIVE:  Note: Objective measures were completed at Evaluation unless otherwise noted.  HAND DOMINANCE: Left  ADLs: Overall ADLs: spouse assists when needed, Pt. reports that she can do most tasks herself. Uses her left hand to engage in self-care tasks. Eating: Able to cut foods with a fork, husband assists as needed.  Grooming: husband assists with applying toothpaste, able to brush teeth and hair UB Dressing: Spouse assists with applying/fastening bra, Pt. Requires assistance with zippers, and buttons. LB Dressing: Spouse assists with tying shoes when needed, uses slip on shoes. Pt. Does not zip pants. Toileting: Able to complete  independently Bathing: Pt. Requires intermittent assistance with bathing tasks. Tub Shower transfers: Spouse assists with transfers when/if needed Equipment: Shower seat with back  IADLs: Shopping: Spouse typically does the shopping, isnt able to carry heavy objects.  Light housekeeping: Spouse typically does most of the cleaning around the home, Pt. Cleans bathroom, and laundry Pt. Is unable to carry laundry due to exacerbation of weakness.  Meal Prep: Spouse typically cooks and prepares meals.  Community mobility: Pt. Requires assistance using walker. Pt. reports that she is unable to carry heavy items, and typically does not do the shopping.  Medication management: Pt. is able to take care of meds with pillbox Financial management:  Spouse takes care of it, and makes online payments.  Handwriting: 50% legible  MOBILITY STATUS: Needs Assist: Pt. Requires use of a walker   POSTURE COMMENTS:  No Significant postural limitations Sitting balance: Good  ACTIVITY TOLERANCE: Activity tolerance:   FUNCTIONAL OUTCOME MEASURES: TBD  UPPER EXTREMITY ROM:    Active ROM Right eval Right 01/16/24 Right 03/02/24 Left eval Left 01/16/24 Left 03/02/24  Shoulder flexion 94(121) 100 with scaption (132) 126(146) 102(125) 134 with scaption (145) 142(148)  Shoulder abduction 75(106) 74 (98) 93(120) 110(126) 140 (170) WNL  Shoulder adduction        Shoulder extension        Shoulder internal rotation        Shoulder external rotation        Elbow flexion 135(155)  150(155) 140(150)  150  Elbow extension 0  0 -10(-9)  -4(0)  Wrist flexion 40(44)  48(60) 42(58)  52(60)  Wrist extension 30(34)  48(60) 40(50)  52(56)  Wrist ulnar deviation        Wrist radial deviation        Wrist pronation        Wrist supination        (Blank rows = not tested)  UPPER EXTREMITY MMT:     MMT Right eval Right 03/02/24 Left eval Left 03/02/24  Shoulder flexion 3-/5 3+/5 3-/5 4/5  Shoulder abduction 3-/5  3-/5 3-/5 4/5  Shoulder adduction      Shoulder extension      Shoulder internal rotation      Shoulder external rotation      Middle trapezius      Lower trapezius      Elbow flexion 4-/5 4+/5 4+/5 5/5  Elbow extension 4-/5 4+/5 4+/5 5/5  Wrist flexion 4-/5 4-/5 3+/5 4/5  Wrist extension 4-/5 4-/5 3+/5 4/5  Wrist ulnar deviation      Wrist radial deviation      Wrist pronation      Wrist supination      (Blank rows = not tested)  HAND FUNCTION Eval:  Grip strength: Right: N/T lbs; Left: 43 lbs, Lateral pinch: Right: N/T lbs, Left: 9 lbs, and 3 point pinch: Right: N/T lbs, Left: 5 lbs Pt. Presents with flexor tightness in the right 5th digit, however is able to passively extend 5th digit within normal range. Pt. Also Presents with hyper extension in the 2nd and 3rd digit at PIP joints. Pt. Is able to achieve full Digit MP, PIP, and DIP PROM.  01/16/24: Grip strength: Left: 33 lbs; Lateral pinching: Left: 8 lbs; 3 point pinch: Left: 3 lbs  03/02/24: Grip strength: Left: 36 lbs; Lateral pinching: Left: 8 lbs; 3 point pinch: Left: 3 lbs  COORDINATION: 9 Hole Peg test: Right: N/T sec; Left: 51 sec 01/16/24: Left: 1 min 1 sec 03/02/24:  Left: 1 min 1 sec   SENSATION: Not tested  EDEMA:   MUSCLE TONE: Fluctuating flexor tone in R hand.  COGNITION: Overall cognitive status: Within functional limits for tasks assessed  VISION: Subjective report:  Baseline vision:  Visual history:   VISION ASSESSMENT:  PERCEPTION:   PRAXIS:   OBSERVATIONS: Pt. presents with fluctuating flexor tone in the R hand, involving the 5th digit at the PIP/DIP joint. Pt. presents with hyperextension at the 2nd and 3rd digits at the PIP joints.  TREATMENT DATE: 03/10/24  RUE positioned on pillow with moist heat at the right shoulder, and hand in conjunction with ROM.  Therapeutic  Exercise:  Therapeutic Ex.:    -Pt. tolerated right hand PROM through all joint ranges 2/2 tightness, and tone.   Therapeutic Activity:  -Facilitated functional reaching with the LUE grasping 1.5 pegs, transferring it from her left hand to her right hand formulating a lateral grasp, then reaching up with the right hand to place it onto an [employer].   PATIENT EDUCATION: Education details: L hand FMC/dexterity skills/strengthening Person educated: Patient Education method: Explanation, Demonstration, and Verbal cues Education comprehension: verbalized understanding, returned demonstration, verbal cues required, and needs further education  HOME EXERCISE PROGRAM: -Pink theraputty; visual handout issued -Self and caregiver assisted PROM to R hand and wrist -Monitor splint with skin checks  GOALS: Goals reviewed with patient? Yes  SHORT TERM GOALS: Target date: 04/13/2024    Pt. Will be independent with HEP for UE functioning. Baseline: Eval: No current HEP; 01/16/24: pt reports doing a little writing and squeezing a ball, but denies using putty; spouse stretches RUE daily 02/20/24:  Pt. Continues to do a little writing and squeezing a ball, but denies using putty; spouse stretches RUE 03/02/24: Pt. Husband assists Pt. With ROM to the right hand. Pt. reports doing hand exercises. Goal status:Ongoing  LONG TERM GOALS: Target date: 05/25/2024   Pt. Will improve [facility] skills to be able to manipulate small objects at home Baseline: 9 hole peg test: L: 51 seconds; 01/16/24: L 1 min 1 sec, 03/02/24: Left: 1 min. & 1 sec. Pt. Continues to work towards using the left to manipulate small objects. Goal status: Ongoing  2.  Pt. Will increase BUE shoulder ROM by 10 degrees to be able to reach into cabinets and shelves.  Baseline: Shoulder flexion: R: 94(121), L: 102(125), Shoulder abduction: R: 75(106), L: 110(126); 01/16/24: Shoulder flexion: R: 100 with scaption (132), L: 134 with scaption  (145), Shoulder abduction: R: 74 (98), L: 140 (170)02/20/24: Pt. Is improving with functional reaching through multiple planes with cues, and assist required. 03/02/24: Shoulder flexion: R: 126(146), L: 142(148), Shoulder abduction: R: 93(120), L: WNL;  Goal status: In progress; Ongoing  3.  Pt. Will improve L grip strength by 5# to be able to securely grasp ADL items at home.  Baseline: Grip strength: R: NT, L: 43#; 01/16/24: L: 33#  03/02/24: 36# Goal status: In progress; Ongoing  4.  Pt. Will increase L lateral key pinch strength by 3#  to open wide mouth jars.  Baseline: Lateral key pinch: R: NT, L: 9#; 01/16/24: L: 8# 03/02/24: Pinch meter is out for calibration. Pt. Continues to have difficulty opening jars Goal status: Ongoing  5.  Pt. Will independently engage the right hand as a gross assist to the left hand 100% of the time during ADLs/IADLs. Baseline: Eval: Pt. With limited engagement of the right hand as a gross assist during tasks; 01/16/24: Observed <25% of the time during OT sessions 02/20/24: Pt. Continues to present with limited engagement of the RUE as a gross assist to the left hand approximately 25% of the time with cues. 03/02/24: Pt. Is starting to engage her right hand more as a gross assist to the left  Goal status: In progress; Ongoing  6.  Pt. Will be able to manipulate zippers, and buttons efficiently with modified independence Baseline: Eval:Right: NT Left: 51 sec; 01/16/24: Left: 1 min and 1 sec 02/20/24: Pt. continues to have  difficulty manipulating zippers and buttons.03/02/24: Pt. continues to have difficulty manipulating zippers, and buttons, and may benefit from reviewing adaptive devices. Goal status: Ongoing   7. Pt. Will improve handwriting legibility to 100% for one sentence in printed form for written correspondence.  Baseline: 50% legible in printed form for first name only; 01/16/24: 80-90% legible to print first name 1 of 4 attempts.  3 of 4 attempts 50% or less  legible 02/20/24: first name only 100%, 25% for lists of words. 03/02/24:  first name only 100%, 25% for lists of words.  Goal status: In progress; Ongoing  8. Pt. Will independently, and efficiently transfer small 1/2 objects from the left hand to the right hand in midline in preparation from reaching up  to place items at a target with 100% accuracy with the right hand.  Baseline: 03/02/24: Pt. Has progressed to transferring 1.5 objects between her hand in midline, and reach up to place them at targets with 60% accuracy  Goal Status: New 03/02/24  9. Pt. Will increased BUE strength by 2 mm grades to assist with ADLs, and IADLs.  Baseline: 03/02/24: UE strength: Shoulder flexion: R: 3+/5 L: 4/5, abduction: R: 3-/5, L 4/5:  elbow flexion: R:4+/5, L: 5/5, extension: R: 4+/5, L:5/5, wrist flexion: R: 4-/5  L: 4/5 wrist extension: R: 4-/5 L: 4/5  Goal Status: New: 03/02/24  ASSESSMENT: CLINICAL IMPRESSION:  Pt. tolerated ROM well with the RUE/hand/digits. Pt. required step-by-step cues initially to engage the right hand when transferring the items between hands, and support proximally when reaching up to place the items into a container.  Pt. required assist to perform a lateral grasp on the Pt. presented with more difficulty the further away that she had to reach. Pt. continues to benefit from OT services to increase BUE ROM in the shoulder, wrist and hands, as well as increasing L grip/pinch strength and improve [facility] skills to be able to perform her desired ADL/IADLs.    PERFORMANCE DEFICITS: in functional skills including ADLs, IADLs, coordination, dexterity, edema, tone, ROM, strength, pain, Fine motor control, Gross motor control, endurance, and UE functional use, and psychosocial skills including coping strategies, environmental adaptation, habits, and routines and behaviors.   IMPAIRMENTS: are limiting patient from ADLs, IADLs, rest and sleep, leisure, and social participation.    CO-MORBIDITIES: may have co-morbidities  that affects occupational performance. Patient will benefit from skilled OT to address above impairments and improve overall function.  MODIFICATION OR ASSISTANCE TO COMPLETE EVALUATION: Min-Moderate modification of tasks or assist with assess necessary to complete an evaluation.  OT OCCUPATIONAL PROFILE AND HISTORY: Detailed assessment: Review of records and additional review of physical, cognitive, psychosocial history related to current functional performance.  CLINICAL DECISION MAKING: Moderate - several treatment options, min-mod task modification necessary  REHAB POTENTIAL: Good  EVALUATION COMPLEXITY: Moderate  PLAN:  OT FREQUENCY: 2x/week  OT DURATION: 12 weeks  PLANNED INTERVENTIONS: 97168 OT Re-evaluation, 97535 self care/ADL training, 02889 therapeutic exercise, 97530 therapeutic activity, 97112 neuromuscular re-education, 97140 manual therapy, 97018 paraffin, 02960 fluidotherapy, 97010 moist heat, 97010 cryotherapy, 97034 contrast bath, 97032 electrical stimulation (manual), 97760 Orthotic Initial, passive range of motion, energy conservation, coping strategies training, patient/family education, and DME and/or AE instructions  RECOMMENDED OTHER SERVICES: PT   CONSULTED AND AGREED WITH PLAN OF CARE: Patient  PLAN FOR NEXT SESSION: Treatment  Bee Marchiano, MS, OTR/L   03/10/24

## 2024-03-10 NOTE — Therapy (Signed)
 OUTPATIENT PHYSICAL THERAPY TREATMENT  Patient Name: Erika Stanton MRN: [id number] DOB:06/27/1955, 68 y.o., female Today's Date: 03/10/2024  PCP: Dr. Oneil Galloway  REFERRING PROVIDER: Dr. Oneil Galloway  END OF SESSION:   PT End of Session - 03/10/24 1103     Visit Number 23    Number of Visits 46    Date for Recertification  05/27/24    Authorization Type [employer]    Progress Note Due on Visit 30    PT Start Time 1103    PT Stop Time 1145    PT Time Calculation (min) 42 min    Equipment Utilized During Treatment Gait belt    Activity Tolerance Patient tolerated treatment well;No increased pain    Behavior During Therapy WFL for tasks assessed/performed              Past Medical History:  Diagnosis Date   Anatomical narrow angle, bilateral    Aneurysm    Asthma    Cerebral aneurysm    Cerebral vasospasm    Cognitive change    Dysphagia, post-stroke    GERD (gastroesophageal reflux disease)    Hemorrhoids    History of cervical dysplasia    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 no recurrent polyps.  suggest repeat surveillance study 11/2022.     COLONOSCOPY W/ POLYPECTOMY  06/2014   Dr Elder at [facility].  3 adenomatoous polyps, anal fissure.     ESOPHAGOGASTRODUODENOSCOPY (EGD) WITH PROPOFOL  N/A 01/27/2020   Procedure: ESOPHAGOGASTRODUODENOSCOPY (EGD) WITH PROPOFOL ;  Surgeon: Shila Gustav GAILS, MD;  Location: [facility];  Service: Endoscopy;  Laterality: N/A;   INSERTION OF PERMANENT INTRAPERITONEAL CANNULA/CATH N/A     LAPAROSCOPIC   IR 3D INDEPENDENT WKST  01/06/2020   IR ANGIO INTRA EXTRACRAN SEL INTERNAL CAROTID BILAT MOD SED  01/06/2020   IR ANGIO VERTEBRAL SEL VERTEBRAL UNI L MOD SED  01/06/2020   IR ANGIOGRAM FOLLOW UP STUDY  01/06/2020   IR ANGIOGRAM FOLLOW UP STUDY  01/06/2020   IR ANGIOGRAM FOLLOW UP STUDY  01/06/2020   IR ANGIOGRAM FOLLOW UP STUDY  01/06/2020   IR ANGIOGRAM FOLLOW UP STUDY  01/06/2020   IR ANGIOGRAM FOLLOW UP STUDY  01/06/2020   IR ANGIOGRAM FOLLOW UP STUDY  01/06/2020   IR ANGIOGRAM FOLLOW UP STUDY  01/06/2020   IR ANGIOGRAM FOLLOW UP STUDY  01/06/2020   IR ANGIOGRAM FOLLOW UP STUDY  01/06/2020   IR NEURO EACH ADD'L AFTER BASIC UNI RIGHT (MS)  01/06/2020   IR TRANSCATH/EMBOLIZ  01/06/2020   MASTECTOMY     MOHS SURGERY N/A    RADIOLOGY WITH ANESTHESIA N/A 01/06/2020   Procedure: IR WITH ANESTHESIA FOR ANEURYSM;  Surgeon: Lanis Pupa, MD;  Location: [facility] OR;  Service: Radiology;  Laterality: N/A;   STENT SUPPORTED EMBOLIZATION OF RIGHT ICA ANEURYSM Right    TUBAL LIGATION     VENTRICULO-PERITONEAL SHUNT PLACEMENT / LAPAROSCOPIC INSERTION PERITONEAL CATHETER N/A    Patient Active Problem  List   Diagnosis Date Noted   Dyspareunia in female 04/05/2020   History of abnormal cervical Pap smear 03/10/2020   GIB (gastrointestinal bleeding) 02/28/2020   Elevated BUN    Prediabetes    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   Cerebral vasospasm    Sinus tachycardia    Dysphagia, post-stroke    Thrombocytopenia    Acute blood loss anemia    Brain aneurysm    ICH (intracerebral hemorrhage) (HCC) 01/05/2020   History of adenomatous polyp of colon 02/07/2016   Anatomical narrow angle, bilateral 12/14/2014   Fissure in ano 11/11/2014   Hemorrhoids 11/11/2014   Constipation 11/19/2013   GERD (gastroesophageal reflux disease) 03/24/2013   Irritable bowel syndrome with diarrhea 03/24/2013   Herpes zoster 05/14/2005   Abnormal Pap smear of cervix 05/15/1983   History  of cervical dysplasia 05/15/1983    ONSET DATE: 12/02/2023  REFERRING DIAG: I63.9 (ICD-10-CM) - CVA (cerebral vascular accident) (HCC)   THERAPY DIAG:  Muscle weakness (generalized)  Other lack of coordination  Abnormality of gait and mobility  Difficulty in walking, not elsewhere classified  Repeated falls  Other abnormalities of gait and mobility  Cognitive communication deficit  Rationale for Evaluation and Treatment: Rehabilitation  SUBJECTIVE:                                                                                                                                                                                             SUBJECTIVE STATEMENT:   Pt reports that she has not experienced any falls, and nothing has really changed.  PERTINENT HISTORY:  Patient reports she was in hospital from [facility]- Sat (July 21st- 25th) with CVA affecting left side. I was supposed to have a knee replacement surgery in September but that is on hold for now. I was doing my prep for my colonoscopy and fell in the bathroom.  Patient reports having mild left sided weakness compared to her CVA in 2021 with heavy Right sided UE weakness. She reports bad OA affecting both knees as well. Previous CVA in 2021 affecting Right side. Reports going to ED  last Monday due to fall/CVA affecting Left side. Diagnosed with R CVA with Left Sided weakness.    PAIN:  Are you having pain? 4/10 bilat knees   PRECAUTIONS: Fall  WEIGHT BEARING RESTRICTIONS: No  FALLS: Has patient fallen in last 6 months? Yes. Number of falls fell with the stroke  PATIENT GOALS: Walk without any assistive device.  OBJECTIVE:  Note: Objective measures were completed at evaluation unless otherwise noted.  TREATMENT DATE : 03/10/2024  TA:   Ambulation to the long hallway and repeated laps, totaling  ~520 ft with 3 standing rest breaks.  SPC utilized and CGA provided for safety  STS with focus on lessening UE reliance and slowed eccentric lowering, 4x5  Ambulation in gym to EVS hallway, to cafeteria, long therapy hallway, and back to gym (~500') with 4 standing rest breaks.  SPC utilized and CGA provided for safety  STS with cues for proper weight displacement and forward weight shift for proper performance, 3x5     PATIENT EDUCATION: Education details: PT plan of care; Discussion of symptoms of CVA and to go to ED as soon as possible. Purpose of PT for balance, strength, coordination and endurance.  Person educated: Patient Education method: Explanation Education comprehension: verbalized understanding  HOME EXERCISE PROGRAM: Access Code: [email or web address] URL: [email or web address] Date: 12/17/2023 Prepared by: Sidra Simpers  Program Notes **Be sure to perform all exercises next to a countertop or sturdy piece of furniture in case you become unsteady.  Exercises - Standing Heel Raise with Support  - 1 x daily - 7 x weekly - 2 sets - 15 reps - Standing Toe Raises at Chair  - 1 x daily - 7 x weekly - 2 sets - 15 reps - Mini Squat with Counter Support  - 1 x daily - 7 x weekly - 2 sets - 15 reps - Lunge with Counter Support  - 1 x daily - 7 x weekly - 2 sets - 15 reps - Standing Tandem Balance with Counter Support  - 1 x daily - 7 x weekly - 2 sets - 2 reps - 30 hold - Standing Single Leg Stance with Counter Support  - 1 x daily - 7 x weekly - 2 sets - 2 reps - 30 hold  GOALS: Goals reviewed with patient? Yes  SHORT TERM GOALS: Target date: 01/21/2024  Pt will be independent with HEP in order to improve strength and balance in order to decrease fall risk and improve function at home.  Baseline: EVAL - No current formal HEP in place; 03/04/2024- Patient reports independent with all current HEP and riding a stationary bike.  Goal status: MET   LONG TERM GOALS: Target  date: 03/03/2024  1.  Patient will complete five times sit to stand test in < 15 seconds indicating an increased LE strength and improved balance. Baseline: EVAL= 25.20 sec with LUE Support 9/2: 23.36 without UE support and CGA-min assist. 20.10 sec with LUE support; 03/04/2024- more difficulty raising without UE but performed 18.75 sec with LUE Support Goal status: INITIAL  2.  Patient will increase Berg Balance score by > 6 points to demonstrate decreased fall risk during functional activities. Baseline: EVAL: To be assessed next visit; 12/12/2023= 42/56 9/2: 44/56  10/16:45/56 Goal status: PROGRESSING   3.  Patient will reduce timed up and go to <11 seconds to reduce fall risk and demonstrate improved transfer/gait ability. Baseline: EVAL = 40 sec with RW 9/2: 18.33sec with SPC 10/16:17.47 sec with SPC Goal status: PROGRESSING  4.  Patient will increase 10 meter walk test to >1.80m/s as to improve gait speed for better community ambulation and to reduce fall risk. Baseline: EVAL: 0.65 m/s 9/2: 0.67 m/s with [facility] 9/9: 0.71 m/s without an AD and wearing 2.5 # AW 10/16: .75 m/s with SPC Goal status: PROGRESSING  5. Patient will increase six minute walk test distance by 200 feet for progression to community ambulator and improve gait  ability Baseline: EVAL= 482' 9/2: 465 feet  using a SPC with CGA required seated rest break at 4:36min  10/16: 542 ft and full 6 minutes  Goal status: INITIAL   ASSESSMENT:  CLINICAL IMPRESSION:   Pt responded fair to the exercises, however noted it to be challenging on her knees and the consistent ambulation placed increased stress on the knee joints.  Pt otherwise performed well with the tasks and was able to ambulate longer distances with fewer rest breaks needed.  Pt encouraged to ambulate at home to improve overall endurance levels and to improve tolerance to exercise at therapy gym.   Pt will continue to benefit from skilled therapy to address  remaining deficits in order to improve overall QoL and return to PLOF.      OBJECTIVE IMPAIRMENTS: Abnormal gait, decreased activity tolerance, decreased balance, decreased coordination, decreased endurance, decreased mobility, difficulty walking, decreased ROM, decreased strength, impaired UE functional use, and pain.   ACTIVITY LIMITATIONS: carrying, lifting, bending, sitting, standing, squatting, sleeping, stairs, and transfers  PARTICIPATION LIMITATIONS: meal prep, cleaning, laundry, driving, shopping, community activity, and yard work  PERSONAL FACTORS: 1-2 comorbidities: OA, previous CVA with significant R sided weakness are also affecting patient's functional outcome.   REHAB POTENTIAL: Good  CLINICAL DECISION MAKING: Stable/uncomplicated  EVALUATION COMPLEXITY: Moderate  PLAN:  PT FREQUENCY: 1-2x/week  PT DURATION: 12 weeks  PLANNED INTERVENTIONS: 97164- PT Re-evaluation, 97750- Physical Performance Testing, 97110-Therapeutic exercises, 97530- Therapeutic activity, [id number]- Neuromuscular re-education, 97535- Self Care, [id number]- Manual therapy, [id number]- Gait training, [phone]- Orthotic Initial, [phone]- Orthotic/Prosthetic subsequent, [phone]- Canalith repositioning, [phone]- Electrical stimulation (manual), [phone] (1-2 muscles), [phone] (3+ muscles)- Dry Needling, Patient/Family education, Balance training, Stair training, Taping, Joint mobilization, Joint manipulation, Spinal manipulation, Spinal mobilization, Compression bandaging, Vestibular training, DME instructions, Cryotherapy, and Moist heat  PLAN FOR NEXT SESSION:  Distance walking tolerance Curb step navigation training  BLE functional strengthening Sit<>stands Step-ups  Gait training with LRAD with increased activity tolerance through hall of hospital to reduce self limiting behavior.  Decrease seated rest breaks for improved endurance   Fonda Simpers, PT, DPT Physical Therapist - [facility]   03/10/24, 2:43 PM

## 2024-03-11 ENCOUNTER — Ambulatory Visit: Admitting: Physical Therapy

## 2024-03-11 ENCOUNTER — Encounter: Admitting: Speech Pathology

## 2024-03-11 ENCOUNTER — Encounter

## 2024-03-12 ENCOUNTER — Ambulatory Visit: Admitting: Physical Therapy

## 2024-03-12 ENCOUNTER — Ambulatory Visit

## 2024-03-12 DIAGNOSIS — R2689 Other abnormalities of gait and mobility: Secondary | ICD-10-CM

## 2024-03-12 DIAGNOSIS — M6281 Muscle weakness (generalized): Secondary | ICD-10-CM

## 2024-03-12 DIAGNOSIS — R278 Other lack of coordination: Secondary | ICD-10-CM

## 2024-03-12 DIAGNOSIS — R296 Repeated falls: Secondary | ICD-10-CM

## 2024-03-12 DIAGNOSIS — R269 Unspecified abnormalities of gait and mobility: Secondary | ICD-10-CM

## 2024-03-12 DIAGNOSIS — R262 Difficulty in walking, not elsewhere classified: Secondary | ICD-10-CM

## 2024-03-12 NOTE — Therapy (Signed)
 OUTPATIENT PHYSICAL THERAPY TREATMENT  Patient Name: Erika Stanton MRN: [id number] DOB:[birth year], 68 y.o., female Today's Date: 03/12/2024  PCP: Dr. Oneil Galloway  REFERRING PROVIDER: Dr. Oneil Galloway  END OF SESSION:   PT End of Session - 03/12/24 1405     Visit Number 24    Number of Visits 46    Date for Recertification  05/27/24    Authorization Type [employer]    Progress Note Due on Visit 30    PT Start Time 1405    PT Stop Time 1445    PT Time Calculation (min) 40 min    Equipment Utilized During Treatment Gait belt    Activity Tolerance Patient tolerated treatment well;No increased pain    Behavior During Therapy WFL for tasks assessed/performed               Past Medical History:  Diagnosis Date   Anatomical narrow angle, bilateral    Aneurysm    Asthma    Cerebral aneurysm    Cerebral vasospasm    Cognitive change    Dysphagia, post-stroke    GERD (gastroesophageal reflux disease)    Hemorrhoids    History of cervical dysplasia    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 no recurrent polyps.  suggest repeat surveillance study 11/2022.     COLONOSCOPY W/ POLYPECTOMY  06/2014   Dr Elder at [facility].  3 adenomatoous polyps, anal fissure.     ESOPHAGOGASTRODUODENOSCOPY (EGD) WITH PROPOFOL  N/A 01/27/2020   Procedure: ESOPHAGOGASTRODUODENOSCOPY (EGD) WITH PROPOFOL ;  Surgeon: Shila Gustav GAILS, MD;  Location: [facility];  Service: Endoscopy;  Laterality: N/A;   INSERTION OF PERMANENT INTRAPERITONEAL CANNULA/CATH N/A     LAPAROSCOPIC   IR 3D INDEPENDENT WKST  01/06/2020   IR ANGIO INTRA EXTRACRAN SEL INTERNAL CAROTID BILAT MOD SED  01/06/2020   IR ANGIO VERTEBRAL SEL VERTEBRAL UNI L MOD SED  01/06/2020   IR ANGIOGRAM FOLLOW UP STUDY  01/06/2020   IR ANGIOGRAM FOLLOW UP STUDY  01/06/2020   IR ANGIOGRAM FOLLOW UP STUDY  01/06/2020   IR ANGIOGRAM FOLLOW UP STUDY  01/06/2020   IR ANGIOGRAM FOLLOW UP STUDY  01/06/2020   IR ANGIOGRAM FOLLOW UP STUDY  01/06/2020   IR ANGIOGRAM FOLLOW UP STUDY  01/06/2020   IR ANGIOGRAM FOLLOW UP STUDY  01/06/2020   IR ANGIOGRAM FOLLOW UP STUDY  01/06/2020   IR ANGIOGRAM FOLLOW UP STUDY  01/06/2020   IR NEURO EACH ADD'L AFTER BASIC UNI RIGHT (MS)  01/06/2020   IR TRANSCATH/EMBOLIZ  01/06/2020   MASTECTOMY     MOHS SURGERY N/A    RADIOLOGY WITH ANESTHESIA N/A 01/06/2020   Procedure: IR WITH ANESTHESIA FOR ANEURYSM;  Surgeon: Lanis Pupa, MD;  Location: [facility] OR;  Service: Radiology;  Laterality: N/A;   STENT SUPPORTED EMBOLIZATION OF RIGHT ICA ANEURYSM Right    TUBAL LIGATION     VENTRICULO-PERITONEAL SHUNT PLACEMENT / LAPAROSCOPIC INSERTION PERITONEAL CATHETER N/A    Patient Active  Problem List   Diagnosis Date Noted   Dyspareunia in female 04/05/2020   History of abnormal cervical Pap smear 03/10/2020   GIB (gastrointestinal bleeding) 02/28/2020   Elevated BUN    Prediabetes    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   Cerebral vasospasm    Sinus tachycardia    Dysphagia, post-stroke    Thrombocytopenia    Acute blood loss anemia    Brain aneurysm    ICH (intracerebral hemorrhage) (HCC) 01/05/2020   History of adenomatous polyp of colon 02/07/2016   Anatomical narrow angle, bilateral 12/14/2014   Fissure in ano 11/11/2014   Hemorrhoids 11/11/2014   Constipation 11/19/2013   GERD (gastroesophageal reflux disease) 03/24/2013   Irritable bowel syndrome with diarrhea 03/24/2013   Herpes zoster 05/14/2005   Abnormal Pap smear of cervix 05/15/1983   History  of cervical dysplasia 05/15/1983    ONSET DATE: 12/02/2023  REFERRING DIAG: I63.9 (ICD-10-CM) - CVA (cerebral vascular accident) (HCC)   THERAPY DIAG:  Muscle weakness (generalized)  Other lack of coordination  Abnormality of gait and mobility  Difficulty in walking, not elsewhere classified  Repeated falls  Other abnormalities of gait and mobility  Rationale for Evaluation and Treatment: Rehabilitation  SUBJECTIVE:                                                                                                                                                                                             SUBJECTIVE STATEMENT:  Pt reports that her knees are bothering her a bit today, 4/10 pain in both knees. Pt denies stumbles or falls.   Pt states that she went for MRI yesterday with neurosurgery; per pt report, potentially coming off of plavix , staying on arpirin based on imaging, waiting to hear from office via MyChart about official results.     PERTINENT HISTORY:  Patient reports she was in hospital from [facility]- Sat (July 21st- 25th) with CVA affecting left side. I was supposed to have a knee replacement surgery in September but that is on hold for now. I was doing my prep for my colonoscopy and fell in the bathroom.  Patient reports having mild left sided weakness compared to her CVA in 2021 with heavy Right sided UE weakness. She reports bad OA affecting both knees as well. Previous CVA in 2021 affecting Right side. Reports going to ED  last Monday due to fall/CVA affecting Left side. Diagnosed with R CVA with Left Sided weakness.    PAIN:  Are you having pain? 4/10 bilat knees   PRECAUTIONS: Fall  WEIGHT BEARING RESTRICTIONS: No  FALLS: Has patient fallen in last 6 months?  Yes. Number of falls fell with the stroke  PATIENT GOALS: Walk without any assistive device.  OBJECTIVE:  Note: Objective measures were completed at evaluation unless otherwise noted.                                                                                                                               TREATMENT DATE : 03/12/2024  Gait 300' with 2x standing rest break. SPC used, CGA provided.  Pt stating that knee pain making her feel unstable today; pt also stating that she feels wobbly today without any significant LOBs noted throughout  Gait 300' with only 1x standing rest break! Continued use of SPC, CGA Pt requiring extensive verbal cueing for continuation of activity in order to limit standing rest breaks  2x10 STS from green chair; use of LUE for push-off from chair, no UE support during eccentric portion Verbal cueing for slowed descent with improved eccentric control Pt demonstrating adequate forward trunk lean needed for activity Pt reporting some mild knee discomfort during activity, R>L  Gait 300' with 2x standing rest breaks with SPC, CGA Continuing to provide extensive verbal cueing for continuation of activity with limited standing rest breaks     PATIENT EDUCATION: Education details: PT plan of care; Discussion of symptoms of CVA and to go to ED as soon as possible. Purpose of PT for balance, strength, coordination and endurance.  Person educated: Patient Education method: Explanation Education comprehension: verbalized understanding  HOME EXERCISE PROGRAM: Access Code: [email or web address] URL: [email or web address] Date: 12/17/2023 Prepared by: Sidra Simpers  Program Notes **Be sure to perform all exercises next to a countertop or sturdy piece of furniture in case you become unsteady.  Exercises - Standing Heel Raise with Support  - 1 x daily - 7 x weekly - 2 sets - 15 reps - Standing Toe Raises at Chair  - 1 x daily - 7 x weekly - 2 sets - 15 reps - Mini Squat with Counter Support  - 1 x daily - 7 x weekly - 2 sets - 15 reps - Lunge with Counter Support  - 1 x daily - 7 x weekly - 2 sets - 15 reps - Standing Tandem Balance with Counter Support  -  1 x daily - 7 x weekly - 2 sets - 2 reps - 30 hold - Standing Single Leg Stance with Counter Support  - 1 x daily - 7 x weekly - 2 sets - 2 reps - 30 hold  GOALS: Goals reviewed with patient? Yes  SHORT TERM GOALS: Target date: 01/21/2024  Pt will be independent with HEP in order to improve strength and balance in order to decrease fall risk and improve function at home.  Baseline: EVAL - No current formal HEP in place; 03/04/2024- Patient reports independent with all current HEP and riding a stationary bike.  Goal status: MET   LONG TERM GOALS: Target date: 05/27/2024  1.  Patient will complete five  times sit to stand test in < 15 seconds indicating an increased LE strength and improved balance. Baseline: EVAL= 25.20 sec with LUE Support 9/2: 23.36 without UE support and CGA-min assist. 20.10 sec with LUE support; 03/04/2024- more difficulty raising without UE but performed 18.75 sec with LUE Support Goal status: INITIAL  2.  Patient will increase Berg Balance score by > 6 points to demonstrate decreased fall risk during functional activities. Baseline: EVAL: To be assessed next visit; 12/12/2023= 42/56 9/2: 44/56  10/16:45/56 Goal status: PROGRESSING   3.  Patient will reduce timed up and go to <11 seconds to reduce fall risk and demonstrate improved transfer/gait ability. Baseline: EVAL = 40 sec with RW 9/2: 18.33sec with SPC 10/16:17.47 sec with SPC Goal status: PROGRESSING  4.  Patient will increase 10 meter walk test to >1.50m/s as to improve gait speed for better community ambulation and to reduce fall risk. Baseline: EVAL: 0.65 m/s 9/2: 0.67 m/s with [facility] 9/9: 0.71 m/s without an AD and wearing 2.5 # AW 10/16: .75 m/s with SPC Goal status: PROGRESSING  5. Patient will increase six minute walk test distance by 200 feet for progression to community ambulator and improve gait ability Baseline: EVAL= 482' 9/2: 465 feet  using a SPC with CGA required seated rest break at 4:37min   10/16: 542 ft and full 6 minutes  Goal status: INITIAL   ASSESSMENT:  CLINICAL IMPRESSION:   Pt responded fair to the exercises, however noted it to be challenging on her knees and the consistent ambulation placed increased stress on the knee joints.  Pt otherwise performed well with the tasks and was able to ambulate with fewer rest breaks with consistent encouragement. Pt encouraged to ambulate at home, increase overall daily activity to break up sedentary time to improve overall endurance levels and to improve tolerance to exercise at therapy gym. Pt will continue to benefit from skilled therapy to address remaining deficits in order to improve overall QoL and return to PLOF.      OBJECTIVE IMPAIRMENTS: Abnormal gait, decreased activity tolerance, decreased balance, decreased coordination, decreased endurance, decreased mobility, difficulty walking, decreased ROM, decreased strength, impaired UE functional use, and pain.   ACTIVITY LIMITATIONS: carrying, lifting, bending, sitting, standing, squatting, sleeping, stairs, and transfers  PARTICIPATION LIMITATIONS: meal prep, cleaning, laundry, driving, shopping, community activity, and yard work  PERSONAL FACTORS: 1-2 comorbidities: OA, previous CVA with significant R sided weakness are also affecting patient's functional outcome.   REHAB POTENTIAL: Good  CLINICAL DECISION MAKING: Stable/uncomplicated  EVALUATION COMPLEXITY: Moderate  PLAN:  PT FREQUENCY: 1-2x/week  PT DURATION: 12 weeks  PLANNED INTERVENTIONS: 97164- PT Re-evaluation, 97750- Physical Performance Testing, 97110-Therapeutic exercises, 97530- Therapeutic activity, [id number]- Neuromuscular re-education, 97535- Self Care, 02859- Manual therapy, [id number]- Gait training, [phone]- Orthotic Initial, [phone]- Orthotic/Prosthetic subsequent, [phone]- Canalith repositioning, [phone]- Electrical stimulation (manual), [phone] (1-2 muscles), [phone] (3+ muscles)- Dry Needling, Patient/Family  education, Balance training, Stair training, Taping, Joint mobilization, Joint manipulation, Spinal manipulation, Spinal mobilization, Compression bandaging, Vestibular training, DME instructions, Cryotherapy, and Moist heat  PLAN FOR NEXT SESSION:  Distance walking tolerance Curb step navigation training  BLE functional strengthening Sit<>stands Step-ups  Gait training with LRAD with increased activity tolerance through hall of hospital to reduce self limiting behavior.  Decrease seated rest breaks for improved endurance   Chiquita Silvan, SPT Physical Therapy Student - [facility]  [facility]  03/12/24, 5:14 PM

## 2024-03-13 NOTE — Therapy (Signed)
 Outpatient Occupational Therapy Neuro Treatment Note   Patient Name: Erika Stanton MRN: [id number] DOB:01-05-1956, 68 y.o., female Today's Date: 03/13/2024  PCP: Laurice Anes, MD REFERRING PROVIDER: Laurice Anes, MD   OT End of Session - 03/13/24 1212     Visit Number 25    Number of Visits 48    Date for Recertification  05/25/24    Authorization Time Period Reporting period beginning 01/16/24    OT Start Time 1316    OT Stop Time 1400    OT Time Calculation (min) 44 min    Equipment Utilized During Treatment transport chair    Activity Tolerance Patient tolerated treatment well    Behavior During Therapy WFL for tasks assessed/performed         Past Medical History:  Diagnosis Date   Anatomical narrow angle, bilateral    Aneurysm    Asthma    Cerebral aneurysm    Cerebral vasospasm    Cognitive change    Dysphagia, post-stroke    GERD (gastroesophageal reflux disease)    Hemorrhoids    History of cervical dysplasia    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 no recurrent polyps.  suggest repeat surveillance study 11/2022.     COLONOSCOPY W/ POLYPECTOMY  06/2014   Dr Elder at [facility].  3 adenomatoous polyps, anal fissure.     ESOPHAGOGASTRODUODENOSCOPY (EGD) WITH PROPOFOL  N/A 01/27/2020   Procedure: ESOPHAGOGASTRODUODENOSCOPY (EGD) WITH PROPOFOL ;  Surgeon: Shila Gustav GAILS, MD;  Location: [facility] ENDOSCOPY;  Service: Endoscopy;  Laterality: N/A;   INSERTION OF PERMANENT INTRAPERITONEAL CANNULA/CATH N/A    LAPAROSCOPIC   IR 3D INDEPENDENT  WKST  01/06/2020   IR ANGIO INTRA EXTRACRAN SEL INTERNAL CAROTID BILAT MOD SED  01/06/2020   IR ANGIO VERTEBRAL SEL VERTEBRAL UNI L MOD SED  01/06/2020   IR ANGIOGRAM FOLLOW UP STUDY  01/06/2020   IR ANGIOGRAM FOLLOW UP STUDY  01/06/2020   IR ANGIOGRAM FOLLOW UP STUDY  01/06/2020   IR ANGIOGRAM FOLLOW UP STUDY  01/06/2020   IR ANGIOGRAM FOLLOW UP STUDY  01/06/2020   IR ANGIOGRAM FOLLOW UP STUDY  01/06/2020   IR ANGIOGRAM FOLLOW UP STUDY  01/06/2020   IR ANGIOGRAM FOLLOW UP STUDY  01/06/2020   IR ANGIOGRAM FOLLOW UP STUDY  01/06/2020   IR ANGIOGRAM FOLLOW UP STUDY  01/06/2020   IR NEURO EACH ADD'L AFTER BASIC UNI RIGHT (MS)  01/06/2020   IR TRANSCATH/EMBOLIZ  01/06/2020   MASTECTOMY     MOHS SURGERY N/A    RADIOLOGY WITH ANESTHESIA N/A 01/06/2020   Procedure: IR WITH ANESTHESIA FOR ANEURYSM;  Surgeon: Lanis Pupa, MD;  Location: [facility] OR;  Service: Radiology;  Laterality: N/A;   STENT SUPPORTED EMBOLIZATION OF RIGHT ICA ANEURYSM Right    TUBAL LIGATION     VENTRICULO-PERITONEAL SHUNT PLACEMENT / LAPAROSCOPIC INSERTION PERITONEAL CATHETER N/A    Patient Active Problem List   Diagnosis Date Noted   Dyspareunia in female 04/05/2020  History of abnormal cervical Pap smear 03/10/2020   GIB (gastrointestinal bleeding) 02/28/2020   Elevated BUN    Prediabetes    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   Cerebral vasospasm    Sinus tachycardia    Dysphagia, post-stroke    Thrombocytopenia    Acute blood loss anemia    Brain aneurysm    ICH (intracerebral hemorrhage) (HCC) 01/05/2020   History of adenomatous polyp of colon 02/07/2016   Anatomical narrow angle, bilateral 12/14/2014   Fissure in ano 11/11/2014   Hemorrhoids 11/11/2014   Constipation 11/19/2013   GERD (gastroesophageal reflux disease) 03/24/2013   Irritable bowel syndrome with diarrhea 03/24/2013   Herpes zoster 05/14/2005   Abnormal Pap smear of cervix 05/15/1983   History of cervical dysplasia 05/15/1983    ONSET DATE: 12/02/2023  REFERRING DIAG: L ischemic CVA  THERAPY DIAG:  Muscle weakness (generalized)  Other lack of coordination  Rationale for Evaluation and Treatment: Rehabilitation  SUBJECTIVE:  SUBJECTIVE STATEMENT: Pt reports doing well today.  PERTINENT HISTORY:  Pt. was admitted to [facility] from 12/02/23-12/06/23 with an Acute Right MCA CVA, Hydrocephalus with shunt  placement. History of Subarachnoid Hemorrhage with bilateral ICA coiled aneurysms. 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  PRECAUTIONS: None  WEIGHT BEARING RESTRICTIONS: No  PAIN: 03/12/24: No UE pain, 4/10 in knees Are you having pain? No  FALLS: Has patient fallen in last 6 months? Yes. Number of falls 1  LIVING ENVIRONMENT: Lives with: lives with their family and lives with their spouse Lives in: House/apartment Stairs: hand rails, 2 steps   Has following equipment at home: [name], cane  PLOF: Independent with basic ADLs, Spouse help as needed  PATIENT GOALS: Strengthening  OBJECTIVE:  Note: Objective measures were completed at Evaluation unless otherwise noted.  HAND DOMINANCE: Left  ADLs: Overall ADLs: spouse assists when needed, Pt. reports that she can do most tasks herself. Uses her left hand to engage in self-care tasks. Eating: Able to cut foods with a fork, husband assists as needed.  Grooming: husband assists with applying toothpaste, able to brush teeth and hair UB Dressing: Spouse assists with applying/fastening bra, Pt. Requires assistance with zippers, and buttons. LB Dressing: Spouse assists with tying shoes when needed, uses slip on shoes. Pt. Does not zip pants. Toileting: Able to complete  independently Bathing: Pt. Requires intermittent assistance with bathing tasks. Tub Shower transfers: Spouse assists with transfers when/if needed Equipment: Shower seat with back  IADLs: Shopping: Spouse typically does the shopping, isnt able to carry heavy objects.  Light housekeeping: Spouse typically does most of the cleaning around the home, Pt. Cleans bathroom, and laundry Pt. Is unable to carry laundry due to exacerbation of weakness.  Meal Prep: Spouse typically cooks and prepares meals.  Community mobility: Pt. Requires assistance using walker. Pt. reports that she is unable to carry heavy items, and typically does not do the shopping.  Medication management: Pt. is able to take care of meds with pillbox Financial management:  Spouse takes care of it, and makes online payments.  Handwriting: 50% legible  MOBILITY STATUS: Needs Assist: Pt. Requires use of a walker   POSTURE COMMENTS:  No Significant postural limitations Sitting balance: Good  ACTIVITY TOLERANCE: Activity tolerance:   FUNCTIONAL OUTCOME MEASURES: TBD  UPPER EXTREMITY ROM:    Active ROM Right eval Right 01/16/24 Right 03/02/24 Left eval Left 01/16/24 Left 03/02/24  Shoulder flexion 94(121) 100 with scaption (132) 126(146) 102(125) 134 with scaption (145) 142(148)  Shoulder abduction 75(106) 74 (98) 93(120) 110(126) 140 (170) WNL  Shoulder adduction        Shoulder extension        Shoulder internal rotation        Shoulder external rotation        Elbow flexion 135(155)  150(155) 140(150)  150  Elbow extension 0  0 -10(-9)  -4(0)  Wrist flexion 40(44)  48(60) 42(58)  52(60)  Wrist extension 30(34)  48(60) 40(50)  52(56)  Wrist ulnar deviation        Wrist radial deviation        Wrist pronation        Wrist supination        (Blank rows = not tested)  UPPER EXTREMITY MMT:     MMT Right eval Right 03/02/24 Left eval Left 03/02/24  Shoulder flexion 3-/5 3+/5 3-/5 4/5  Shoulder abduction 3-/5  3-/5 3-/5 4/5  Shoulder adduction      Shoulder extension      Shoulder internal rotation      Shoulder external rotation      Middle trapezius      Lower trapezius      Elbow flexion 4-/5 4+/5 4+/5 5/5  Elbow extension 4-/5 4+/5 4+/5 5/5  Wrist flexion 4-/5 4-/5 3+/5 4/5  Wrist extension 4-/5 4-/5 3+/5 4/5  Wrist ulnar deviation      Wrist radial deviation      Wrist pronation      Wrist supination      (Blank rows = not tested)  HAND FUNCTION Eval:  Grip strength: Right: N/T lbs; Left: 43 lbs, Lateral pinch: Right: N/T lbs, Left: 9 lbs, and 3 point pinch: Right: N/T lbs, Left: 5 lbs Pt. Presents with flexor tightness in the right 5th digit, however is able to passively extend 5th digit within normal range. Pt. Also Presents with hyper extension in the 2nd and 3rd digit at PIP joints. Pt. Is able to achieve full Digit MP, PIP, and DIP PROM.  01/16/24: Grip strength: Left: 33 lbs; Lateral pinching: Left: 8 lbs; 3 point pinch: Left: 3 lbs  03/02/24: Grip strength: Left: 36 lbs; Lateral pinching: Left: 8 lbs; 3 point pinch: Left: 3 lbs  COORDINATION: 9 Hole Peg test: Right: N/T sec; Left: 51 sec 01/16/24: Left: 1 min 1 sec 03/02/24:  Left: 1 min 1 sec   SENSATION: Not tested  EDEMA:   MUSCLE TONE: Fluctuating flexor tone in R hand.  COGNITION: Overall cognitive status: Within functional limits for tasks assessed  VISION: Subjective report:  Baseline vision:  Visual history:   VISION ASSESSMENT:  PERCEPTION:   PRAXIS:   OBSERVATIONS: Pt. presents with fluctuating flexor tone in the R hand, involving the 5th digit at the PIP/DIP joint. Pt. presents with hyperextension at the 2nd and 3rd digits at the PIP joints.  TREATMENT DATE: 03/12/24 Therapeutic Exercise: -L grip strengthening: Pt removed jumbo pegs from pegboard using hand gripper set at 6.6# for 2  trials.  Occasional min A to reposition hand over hand gripper to maximize grip ability/reduce dropped pegs.  Therapeutic Activity: -Facilitated bilat hand coordination skills using R hand as a stabilizer for opening various types and sizes of pill bottles/tops.  Pt practiced with easy open tops, push and twist, flip tops, and pull table/twist tops.  Pt required min A to position wider bottles within R hand, and occasional min vc for technique.  Self Care: -Issued palm protector for wear during day time.  Instructed pt in donning/doffing strategies (min-mod A to don), and reviewed wearing recommendations.  Encouraged pt perform skin checks, specifically around thumb hole and velcro straps.  PATIENT EDUCATION: Education details: Radio Producer educated: Patient Education method: Solicitor, and Verbal cues Education comprehension: verbalized understanding, returned demonstration, verbal cues required, and needs further education  HOME EXERCISE PROGRAM: -Pink theraputty; visual handout issued -Self and caregiver assisted PROM to R hand and wrist -Monitor splint with skin checks  GOALS: Goals reviewed with patient? Yes  SHORT TERM GOALS: Target date: 04/13/2024    Pt. Will be independent with HEP for UE functioning. Baseline: Eval: No current HEP; 01/16/24: pt reports doing a little writing and squeezing a ball, but denies using putty; spouse stretches RUE daily 02/20/24:  Pt. Continues to do a little writing and squeezing a ball, but denies using putty; spouse stretches RUE 03/02/24: Pt. Husband assists Pt. With ROM to the right hand. Pt. reports doing hand exercises. Goal status:Ongoing  LONG TERM GOALS: Target date: 05/25/2024   Pt. Will improve [facility] skills to be able to manipulate small objects at home Baseline: 9 hole peg test: L: 51 seconds; 01/16/24: L 1 min 1 sec, 03/02/24: Left: 1 min. & 1 sec. Pt. Continues to work towards using the left to manipulate small  objects. Goal status: Ongoing  2.  Pt. Will increase BUE shoulder ROM by 10 degrees to be able to reach into cabinets and shelves.  Baseline: Shoulder flexion: R: 94(121), L: 102(125), Shoulder abduction: R: 75(106), L: 110(126); 01/16/24: Shoulder flexion: R: 100 with scaption (132), L: 134 with scaption (145), Shoulder abduction: R: 74 (98), L: 140 (170)02/20/24: Pt. Is improving with functional reaching through multiple planes with cues, and assist required. 03/02/24: Shoulder flexion: R: 126(146), L: 142(148), Shoulder abduction: R: 93(120), L: WNL;  Goal status: In progress; Ongoing  3.  Pt. Will improve L grip strength by 5# to be able to securely grasp ADL items at home.  Baseline: Grip strength: R: NT, L: 43#; 01/16/24: L: 33#  03/02/24: 36# Goal status: In progress; Ongoing  4.  Pt. Will increase L lateral key pinch strength by 3#  to open wide mouth jars.  Baseline: Lateral key pinch: R: NT, L: 9#; 01/16/24: L: 8# 03/02/24: Pinch meter is out for calibration. Pt. Continues to have difficulty opening jars Goal status: Ongoing  5.  Pt. Will independently engage the right hand as a gross assist to the left hand 100% of the time during ADLs/IADLs. Baseline: Eval: Pt. With limited engagement of the right hand as a gross assist during tasks; 01/16/24: Observed <25% of the time during OT sessions 02/20/24: Pt. Continues to present with limited engagement of the RUE as a gross assist to the left hand approximately 25% of the time with cues. 03/02/24: Pt. Is starting to engage her right hand more as  a gross assist to the left  Goal status: In progress; Ongoing  6.  Pt. Will be able to manipulate zippers, and buttons efficiently with modified independence Baseline: Eval:Right: NT Left: 51 sec; 01/16/24: Left: 1 min and 1 sec 02/20/24: Pt. continues to have difficulty manipulating zippers and buttons.03/02/24: Pt. continues to have difficulty manipulating zippers, and buttons, and may benefit from reviewing  adaptive devices. Goal status: Ongoing   7. Pt. Will improve handwriting legibility to 100% for one sentence in printed form for written correspondence.  Baseline: 50% legible in printed form for first name only; 01/16/24: 80-90% legible to print first name 1 of 4 attempts.  3 of 4 attempts 50% or less legible 02/20/24: first name only 100%, 25% for lists of words. 03/02/24:  first name only 100%, 25% for lists of words.  Goal status: In progress; Ongoing  8. Pt. Will independently, and efficiently transfer small 1/2 objects from the left hand to the right hand in midline in preparation from reaching up  to place items at a target with 100% accuracy with the right hand.  Baseline: 03/02/24: Pt. Has progressed to transferring 1.5 objects between her hand in midline, and reach up to place them at targets with 60% accuracy  Goal Status: New 03/02/24  9. Pt. Will increased BUE strength by 2 mm grades to assist with ADLs, and IADLs.  Baseline: 03/02/24: UE strength: Shoulder flexion: R: 3+/5 L: 4/5, abduction: R: 3-/5, L 4/5:  elbow flexion: R:4+/5, L: 5/5, extension: R: 4+/5, L:5/5, wrist flexion: R: 4-/5  L: 4/5 wrist extension: R: 4-/5 L: 4/5  Goal Status: New: 03/02/24  ASSESSMENT: CLINICAL IMPRESSION: Pt receptive to palm protector today, acknowledged comfort when donned, and wearing recommendations during the day.  Pt verbalized understanding to monitor skin integrity, specifically around thumb hole and velcro strapping.  Good use of R hand as a stabilizer to help with opening pill bottles today.  Pt required min a on wider bottles d/t R SF getting stuck into flexion against palm.  Pt. continues to benefit from OT services to increase BUE ROM in the shoulder, wrist and hands, as well as increasing L grip/pinch strength and improve [facility] skills to be able to perform her desired ADL/IADLs.    PERFORMANCE DEFICITS: in functional skills including ADLs, IADLs, coordination, dexterity, edema, tone, ROM,  strength, pain, Fine motor control, Gross motor control, endurance, and UE functional use, and psychosocial skills including coping strategies, environmental adaptation, habits, and routines and behaviors.   IMPAIRMENTS: are limiting patient from ADLs, IADLs, rest and sleep, leisure, and social participation.   CO-MORBIDITIES: may have co-morbidities  that affects occupational performance. Patient will benefit from skilled OT to address above impairments and improve overall function.  MODIFICATION OR ASSISTANCE TO COMPLETE EVALUATION: Min-Moderate modification of tasks or assist with assess necessary to complete an evaluation.  OT OCCUPATIONAL PROFILE AND HISTORY: Detailed assessment: Review of records and additional review of physical, cognitive, psychosocial history related to current functional performance.  CLINICAL DECISION MAKING: Moderate - several treatment options, min-mod task modification necessary  REHAB POTENTIAL: Good  EVALUATION COMPLEXITY: Moderate  PLAN:  OT FREQUENCY: 2x/week  OT DURATION: 12 weeks  PLANNED INTERVENTIONS: 97168 OT Re-evaluation, 97535 self care/ADL training, 02889 therapeutic exercise, 97530 therapeutic activity, 97112 neuromuscular re-education, 97140 manual therapy, 97018 paraffin, 02960 fluidotherapy, 97010 moist heat, 97010 cryotherapy, 97034 contrast bath, 97032 electrical stimulation (manual), 97760 Orthotic Initial, passive range of motion, energy conservation, coping strategies training, patient/family education, and DME and/or  AE instructions  RECOMMENDED OTHER SERVICES: PT   CONSULTED AND AGREED WITH PLAN OF CARE: Patient  PLAN FOR NEXT SESSION: Treatment   Inocente Blazing, MS, OTR/L

## 2024-03-16 ENCOUNTER — Encounter: Admitting: Speech Pathology

## 2024-03-16 ENCOUNTER — Ambulatory Visit

## 2024-03-17 ENCOUNTER — Ambulatory Visit

## 2024-03-18 ENCOUNTER — Ambulatory Visit: Attending: Family Medicine

## 2024-03-18 ENCOUNTER — Ambulatory Visit

## 2024-03-18 ENCOUNTER — Encounter: Admitting: Speech Pathology

## 2024-03-18 DIAGNOSIS — R278 Other lack of coordination: Secondary | ICD-10-CM | POA: Diagnosis present

## 2024-03-18 DIAGNOSIS — R262 Difficulty in walking, not elsewhere classified: Secondary | ICD-10-CM

## 2024-03-18 DIAGNOSIS — M6281 Muscle weakness (generalized): Secondary | ICD-10-CM

## 2024-03-18 DIAGNOSIS — R2689 Other abnormalities of gait and mobility: Secondary | ICD-10-CM | POA: Insufficient documentation

## 2024-03-18 DIAGNOSIS — R269 Unspecified abnormalities of gait and mobility: Secondary | ICD-10-CM | POA: Diagnosis present

## 2024-03-18 DIAGNOSIS — R296 Repeated falls: Secondary | ICD-10-CM

## 2024-03-18 NOTE — Therapy (Unsigned)
 Outpatient Occupational Therapy Neuro Treatment Note   Patient Name: Erika Stanton MRN: [id number] DOB:1955-07-08, 68 y.o., female Today's Date: 03/20/2024  PCP: Laurice Anes, MD REFERRING PROVIDER: Laurice Anes, MD  OT End of Session - 03/18/24       Visit Number 26    Number of Visits 48     Date for Recertification  05/25/24     Authorization Time Period Reporting period beginning 01/16/24     OT Start Time 1400p    OT Stop Time 1445p     OT Time Calculation (min) 45 min     Equipment Utilized During Treatment transport chair     Activity Tolerance Patient tolerated treatment well     Behavior During Therapy WFL for tasks assessed/performed    Past Medical History:  Diagnosis Date   Anatomical narrow angle, bilateral    Aneurysm    Asthma    Cerebral aneurysm    Cerebral vasospasm    Cognitive change    Dysphagia, post-stroke    GERD (gastroesophageal reflux disease)    Hemorrhoids    History of cervical dysplasia    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 no recurrent polyps.  suggest repeat surveillance study 11/2022.     COLONOSCOPY W/ POLYPECTOMY  06/2014   Dr Elder at [facility].  3 adenomatoous polyps, anal fissure.     ESOPHAGOGASTRODUODENOSCOPY (EGD) WITH PROPOFOL  N/A 01/27/2020   Procedure: ESOPHAGOGASTRODUODENOSCOPY (EGD) WITH PROPOFOL ;  Surgeon: Shila Gustav GAILS, MD;  Location: [facility] END[facility];  Service: Endoscopy;  Laterality: N/A;   INSERTION OF PERMANENT INTRAPERITONEAL CANNULA/CATH N/A    LAPAROSCOPIC   IR 3D INDEPENDENT WKST   01/06/2020   IR ANGIO INTRA EXTRACRAN SEL INTERNAL CAROTID BILAT MOD SED  01/06/2020   IR ANGIO VERTEBRAL SEL VERTEBRAL UNI L MOD SED  01/06/2020   IR ANGIOGRAM FOLLOW UP STUDY  01/06/2020   IR ANGIOGRAM FOLLOW UP STUDY  01/06/2020   IR ANGIOGRAM FOLLOW UP STUDY  01/06/2020   IR ANGIOGRAM FOLLOW UP STUDY  01/06/2020   IR ANGIOGRAM FOLLOW UP STUDY  01/06/2020   IR ANGIOGRAM FOLLOW UP STUDY  01/06/2020   IR ANGIOGRAM FOLLOW UP STUDY  01/06/2020   IR ANGIOGRAM FOLLOW UP STUDY  01/06/2020   IR ANGIOGRAM FOLLOW UP STUDY  01/06/2020   IR ANGIOGRAM FOLLOW UP STUDY  01/06/2020   IR NEURO EACH ADD'L AFTER BASIC UNI RIGHT (MS)  01/06/2020   IR TRANSCATH/EMBOLIZ  01/06/2020   MASTECTOMY     MOHS SURGERY N/A    RADIOLOGY WITH ANESTHESIA N/A 01/06/2020   Procedure: IR WITH ANESTHESIA FOR ANEURYSM;  Surgeon: Lanis Pupa, MD;  Location: [facility] OR;  Service: Radiology;  Laterality: N/A;   STENT SUPPORTED EMBOLIZATION OF RIGHT ICA ANEURYSM Right    TUBAL LIGATION     VENTRICULO-PERITONEAL SHUNT PLACEMENT / LAPAROSCOPIC INSERTION PERITONEAL CATHETER N/A    Patient Active Problem List   Diagnosis Date Noted   Dyspareunia in female 04/05/2020  History of abnormal cervical Pap smear 03/10/2020   GIB (gastrointestinal bleeding) 02/28/2020   Elevated BUN    Prediabetes    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   Cerebral vasospasm    Sinus tachycardia    Dysphagia, post-stroke    Thrombocytopenia    Acute blood loss anemia    Brain aneurysm    ICH (intracerebral hemorrhage) (HCC) 01/05/2020   History of adenomatous polyp of colon 02/07/2016   Anatomical narrow angle, bilateral 12/14/2014   Fissure in ano 11/11/2014   Hemorrhoids 11/11/2014   Constipation 11/19/2013   GERD (gastroesophageal reflux disease) 03/24/2013   Irritable bowel syndrome with diarrhea 03/24/2013   Herpes zoster 05/14/2005   Abnormal Pap smear of cervix 05/15/1983   History of cervical dysplasia 05/15/1983    ONSET DATE: 12/02/2023  REFERRING DIAG: L ischemic CVA  THERAPY DIAG:  Muscle weakness (generalized)  Other lack of coordination  Rationale for Evaluation and Treatment: Rehabilitation  SUBJECTIVE:  SUBJECTIVE STATEMENT: Pt reports wanting to continue to work on strengthening her L hand.  PERTINENT HISTORY:  Pt. was admitted to [facility] from 12/02/23-12/06/23 with an Acute Right MCA CVA, Hydrocephalus with shunt  placement. History of Subarachnoid Hemorrhage with bilateral ICA coiled aneurysms. 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  PRECAUTIONS: None  WEIGHT BEARING RESTRICTIONS: No  PAIN: 03/18/24: 4/10 pain in knees 03/12/24: No UE pain, 4/10 in knees Are you having pain? No  FALLS: Has patient fallen in last 6 months? Yes. Number of falls 1  LIVING ENVIRONMENT: Lives with: lives with their family and lives with their spouse Lives in: House/apartment Stairs: hand rails, 2 steps   Has following equipment at home: [name], cane  PLOF: Independent with basic ADLs, Spouse help as needed  PATIENT GOALS: Strengthening  OBJECTIVE:  Note: Objective measures were completed at Evaluation unless otherwise noted.  HAND DOMINANCE: Left  ADLs: Overall ADLs: spouse assists when needed, Pt. reports that she can do most tasks herself. Uses her left hand to engage in self-care tasks. Eating: Able to cut foods with a fork, husband assists as needed.  Grooming: husband assists with applying toothpaste, able to brush teeth and hair UB Dressing: Spouse assists with applying/fastening bra, Pt. Requires assistance with zippers, and buttons. LB Dressing: Spouse assists with tying shoes when needed, uses slip on shoes.  Pt. Does not zip pants. Toileting: Able to complete independently Bathing: Pt. Requires intermittent assistance with bathing tasks. Tub Shower transfers: Spouse assists with transfers when/if needed Equipment: Shower seat with back  IADLs: Shopping: Spouse typically does the shopping, isnt able to carry heavy objects.  Light housekeeping: Spouse typically does most of the cleaning around the home, Pt. Cleans bathroom, and laundry Pt. Is unable to carry laundry due to exacerbation of weakness.  Meal Prep: Spouse typically cooks and prepares meals.  Community mobility: Pt. Requires assistance using walker. Pt. reports that she is unable to carry heavy items, and typically does not do the shopping.  Medication management: Pt. is able to take care of meds with pillbox Financial management:  Spouse takes care of it, and makes online payments.  Handwriting: 50% legible  MOBILITY STATUS: Needs Assist: Pt. Requires use of a walker   POSTURE COMMENTS:  No Significant postural limitations Sitting balance: Good  ACTIVITY TOLERANCE: Activity tolerance:   FUNCTIONAL OUTCOME MEASURES: TBD  UPPER EXTREMITY ROM:    Active ROM Right eval Right 01/16/24 Right 03/02/24 Left eval Left 01/16/24 Left 03/02/24  Shoulder flexion 94(121) 100 with scaption (132) 126(146) 102(125) 134 with scaption (145) 142(148)  Shoulder abduction 75(106) 74 (98) 93(120) 110(126) 140 (170) WNL  Shoulder adduction        Shoulder extension        Shoulder internal rotation        Shoulder external rotation        Elbow flexion 135(155)  150(155) 140(150)  150  Elbow extension 0  0 -10(-9)  -4(0)  Wrist flexion 40(44)  48(60) 42(58)  52(60)  Wrist extension 30(34)  48(60) 40(50)  52(56)  Wrist ulnar deviation        Wrist radial deviation        Wrist pronation        Wrist supination        (Blank rows = not tested)  UPPER EXTREMITY MMT:     MMT Right eval Right 03/02/24 Left eval Left 03/02/24  Shoulder  flexion 3-/5 3+/5 3-/5 4/5  Shoulder abduction 3-/5 3-/5 3-/5 4/5  Shoulder adduction      Shoulder extension      Shoulder internal rotation      Shoulder external rotation      Middle trapezius      Lower trapezius      Elbow flexion 4-/5 4+/5 4+/5 5/5  Elbow extension 4-/5 4+/5 4+/5 5/5  Wrist flexion 4-/5 4-/5 3+/5 4/5  Wrist extension 4-/5 4-/5 3+/5 4/5  Wrist ulnar deviation      Wrist radial deviation      Wrist pronation      Wrist supination      (Blank rows = not tested)  HAND FUNCTION Eval:  Grip strength: Right: N/T lbs; Left: 43 lbs, Lateral pinch: Right: N/T lbs, Left: 9 lbs, and 3 point pinch: Right: N/T lbs, Left: 5 lbs Pt. Presents with flexor tightness in the right 5th digit, however is able to passively extend 5th digit within normal range. Pt. Also Presents with hyper extension in the 2nd and 3rd digit at PIP joints. Pt. Is able to achieve full Digit MP, PIP, and DIP PROM.  01/16/24: Grip strength: Left: 33 lbs; Lateral pinching: Left: 8 lbs; 3 point pinch: Left: 3 lbs  03/02/24: Grip strength: Left: 36 lbs; Lateral pinching: Left: 8 lbs; 3 point pinch: Left: 3 lbs  COORDINATION: 9 Hole Peg test: Right: N/T sec; Left: 51 sec 01/16/24: Left: 1 min 1 sec 03/02/24:  Left: 1 min 1 sec  SENSATION: Not tested  EDEMA:   MUSCLE TONE: Fluctuating flexor tone in R hand.  COGNITION: Overall cognitive status: Within functional limits for tasks assessed  VISION: Subjective report:  Baseline vision:  Visual history:   VISION ASSESSMENT:  PERCEPTION:   PRAXIS:   OBSERVATIONS: Pt. presents with fluctuating flexor tone in the R hand, involving the 5th digit at the PIP/DIP joint. Pt. presents with hyperextension at the 2nd and 3rd digits at the PIP joints.  TREATMENT DATE: 03/18/24 Therapeutic Exercise: -L hand strengthening: Facilitated L lateral and  3 point pinch strengthening working with therapy resistant clothespins (yellow, red, green), moving pins on/off a vertical dowel; min vc and demo for pinch prehension patterns.  Therapeutic Activity: -Facilitated L forearm, wrist, and hand strengthening/coordination with participation in EZ board tools.  Pt worked with long handled tool to facilitate L wrist flex/ext, large base key turn, and large dial turn x2 reps for each tool (up/down board=1 rep).  Rest breaks between sets and mod vc for technique to maximize ROM with each tool rotation against wide strip of velcro resistance.  Pt required min A-mod A to maintain tools level on board during each rotation.   Self Care: -Recommendation to bring/wear palm protector to each therapy visit; reinforced wearing during the day to prevent digit contractures. -Participation in handwriting: set up required to formulate tripod grasp in L hand.  Pt declined built up foam and preferred standard pen, despite increased effort to maintain grip.  Practiced writing first name in print and cursive.  Pt benefited from highlighted line for visual cue to keep letters level, despite using lined paper, and vc/visual demo for connecting lines within her letters to increase legibility.  -OT positioned RUE in supportive position throughout session with pillow beneath RUE on table top, rolled washcloth within palm, and wrapped moist hot pack around hand for tone reduction.  PATIENT EDUCATION: Education details: Audiological scientist Person educated: Patient Education method: Solicitor, and Verbal cues Education comprehension: verbalized understanding, returned demonstration, verbal cues required, and needs further education  HOME EXERCISE PROGRAM: -Pink theraputty; visual handout issued -Self and caregiver assisted PROM to R hand and wrist -Monitor splint with skin checks  GOALS: Goals reviewed with patient? Yes  SHORT TERM GOALS:  Target date: 04/13/2024    Pt. Will be independent with HEP for UE functioning. Baseline: Eval: No current HEP; 01/16/24: pt reports doing a little writing and squeezing a ball, but denies using putty; spouse stretches RUE daily 02/20/24:  Pt. Continues to do a little writing and squeezing a ball, but denies using putty; spouse stretches RUE 03/02/24: Pt. Husband assists Pt. With ROM to the right hand. Pt. reports doing hand exercises. Goal status:Ongoing  LONG TERM GOALS: Target date: 05/25/2024   Pt. Will improve [facility] skills to be able to manipulate small objects at home Baseline: 9 hole peg test: L: 51 seconds; 01/16/24: L 1 min 1 sec, 03/02/24: Left: 1 min. & 1 sec. Pt. Continues to work towards using the left to manipulate small objects. Goal status: Ongoing  2.  Pt. Will increase BUE shoulder ROM by 10 degrees to be able to reach into cabinets and shelves.  Baseline: Shoulder flexion: R: 94(121), L: 102(125), Shoulder abduction: R: 75(106), L: 110(126); 01/16/24: Shoulder flexion: R: 100 with scaption (132), L: 134 with scaption (145), Shoulder abduction: R: 74 (98), L: 140 (170)02/20/24: Pt. Is improving with functional reaching through multiple planes with cues, and assist required. 03/02/24: Shoulder flexion: R: 126(146), L: 142(148), Shoulder abduction: R: 93(120), L: WNL;  Goal status: In progress; Ongoing  3.  Pt. Will improve L grip strength by 5# to be able to securely grasp ADL items at home.  Baseline: Grip strength: R: NT, L: 43#; 01/16/24: L: 33#  03/02/24: 36# Goal status: In progress; Ongoing  4.  Pt. Will increase L lateral key pinch strength by 3#  to open wide mouth jars.  Baseline: Lateral key pinch: R: NT, L:  9#; 01/16/24: L: 8# 03/02/24: Pinch meter is out for calibration. Pt. Continues to have difficulty opening jars Goal status: Ongoing  5.  Pt. Will independently engage the right hand as a gross assist to the left hand 100% of the time during ADLs/IADLs. Baseline: Eval: Pt.  With limited engagement of the right hand as a gross assist during tasks; 01/16/24: Observed <25% of the time during OT sessions 02/20/24: Pt. Continues to present with limited engagement of the RUE as a gross assist to the left hand approximately 25% of the time with cues. 03/02/24: Pt. Is starting to engage her right hand more as a gross assist to the left  Goal status: In progress; Ongoing  6.  Pt. Will be able to manipulate zippers, and buttons efficiently with modified independence Baseline: Eval:Right: NT Left: 51 sec; 01/16/24: Left: 1 min and 1 sec 02/20/24: Pt. continues to have difficulty manipulating zippers and buttons.03/02/24: Pt. continues to have difficulty manipulating zippers, and buttons, and may benefit from reviewing adaptive devices. Goal status: Ongoing   7. Pt. Will improve handwriting legibility to 100% for one sentence in printed form for written correspondence.  Baseline: 50% legible in printed form for first name only; 01/16/24: 80-90% legible to print first name 1 of 4 attempts.  3 of 4 attempts 50% or less legible 02/20/24: first name only 100%, 25% for lists of words. 03/02/24:  first name only 100%, 25% for lists of words.  Goal status: In progress; Ongoing  8. Pt. Will independently, and efficiently transfer small 1/2 objects from the left hand to the right hand in midline in preparation from reaching up  to place items at a target with 100% accuracy with the right hand.  Baseline: 03/02/24: Pt. Has progressed to transferring 1.5 objects between her hand in midline, and reach up to place them at targets with 60% accuracy  Goal Status: New 03/02/24  9. Pt. Will increased BUE strength by 2 mm grades to assist with ADLs, and IADLs.  Baseline: 03/02/24: UE strength: Shoulder flexion: R: 3+/5 L: 4/5, abduction: R: 3-/5, L 4/5:  elbow flexion: R:4+/5, L: 5/5, extension: R: 4+/5, L:5/5, wrist flexion: R: 4-/5  L: 4/5 wrist extension: R: 4-/5 L: 4/5  Goal Status: New:  03/02/24  ASSESSMENT: CLINICAL IMPRESSION: Pt encouraged to bring palm protector to each session and to wear intermittently throughout the day for contracture prevention in R hand.  Pt reports she's currently wearing palm protector for about 30 min per day.  Pt receptive and agreed to rolled washcloth placed in hand throughout session as she did not have her palm protector with her today.  Pt continues to focus on L hand strength and coordination skills.  Pt was able to manage light-moderate resistant clothes pins today (no blue or black), and requiring min-mod A to keep EZ board tools level during each rotation on wide strip of velcro.  Pt requires assist to set up pen in hand to formulate tripod grasp; first name legibility printed and cursive ~50-75% legible today.  Pt. continues to benefit from OT services to increase BUE ROM in the shoulder, wrist and hands, as well as increasing L grip/pinch strength and improve [facility] skills to be able to perform her desired ADL/IADLs.    PERFORMANCE DEFICITS: in functional skills including ADLs, IADLs, coordination, dexterity, edema, tone, ROM, strength, pain, Fine motor control, Gross motor control, endurance, and UE functional use, and psychosocial skills including coping strategies, environmental adaptation, habits, and routines and behaviors.  IMPAIRMENTS: are limiting patient from ADLs, IADLs, rest and sleep, leisure, and social participation.   CO-MORBIDITIES: may have co-morbidities  that affects occupational performance. Patient will benefit from skilled OT to address above impairments and improve overall function.  MODIFICATION OR ASSISTANCE TO COMPLETE EVALUATION: Min-Moderate modification of tasks or assist with assess necessary to complete an evaluation.  OT OCCUPATIONAL PROFILE AND HISTORY: Detailed assessment: Review of records and additional review of physical, cognitive, psychosocial history related to current functional performance.  CLINICAL  DECISION MAKING: Moderate - several treatment options, min-mod task modification necessary  REHAB POTENTIAL: Good  EVALUATION COMPLEXITY: Moderate  PLAN:  OT FREQUENCY: 2x/week  OT DURATION: 12 weeks  PLANNED INTERVENTIONS: 97168 OT Re-evaluation, 97535 self care/ADL training, 02889 therapeutic exercise, 97530 therapeutic activity, 97112 neuromuscular re-education, 97140 manual therapy, 97018 paraffin, 02960 fluidotherapy, 97010 moist heat, 97010 cryotherapy, 97034 contrast bath, 97032 electrical stimulation (manual), 97760 Orthotic Initial, passive range of motion, energy conservation, coping strategies training, patient/family education, and DME and/or AE instructions  RECOMMENDED OTHER SERVICES: PT   CONSULTED AND AGREED WITH PLAN OF CARE: Patient  PLAN FOR NEXT SESSION: Treatment   Inocente Blazing, MS, OTR/L

## 2024-03-18 NOTE — Therapy (Signed)
 OUTPATIENT PHYSICAL THERAPY TREATMENT  Patient Name: Erika Stanton MRN: [id number] DOB:Jan 06, 1956, 68 y.o., female Today's Date: 03/18/2024  PCP: Dr. Oneil Galloway  REFERRING PROVIDER: Dr. Oneil Galloway  END OF SESSION:   PT End of Session - 03/18/24 1452     Visit Number 25    Number of Visits 46    Date for Recertification  05/27/24    Authorization Type [employer]    Progress Note Due on Visit 30    PT Start Time 1451    PT Stop Time 1530    PT Time Calculation (min) 39 min    Equipment Utilized During Treatment Gait belt    Activity Tolerance Patient tolerated treatment well    Behavior During Therapy WFL for tasks assessed/performed               Past Medical History:  Diagnosis Date   Anatomical narrow angle, bilateral    Aneurysm    Asthma    Cerebral aneurysm    Cerebral vasospasm    Cognitive change    Dysphagia, post-stroke    GERD (gastroesophageal reflux disease)    Hemorrhoids    History of cervical dysplasia    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 no recurrent polyps.  suggest repeat surveillance study 11/2022.     COLONOSCOPY W/ POLYPECTOMY  06/2014   Dr Elder at [facility].  3 adenomatoous polyps, anal fissure.     ESOPHAGOGASTRODUODENOSCOPY (EGD) WITH PROPOFOL  N/A 01/27/2020   Procedure: ESOPHAGOGASTRODUODENOSCOPY (EGD) WITH PROPOFOL ;  Surgeon: Shila Gustav GAILS, MD;  Location: [facility];  Service: Endoscopy;  Laterality: N/A;   INSERTION OF PERMANENT INTRAPERITONEAL CANNULA/CATH N/A    LAPAROSCOPIC   IR 3D  INDEPENDENT WKST  01/06/2020   IR ANGIO INTRA EXTRACRAN SEL INTERNAL CAROTID BILAT MOD SED  01/06/2020   IR ANGIO VERTEBRAL SEL VERTEBRAL UNI L MOD SED  01/06/2020   IR ANGIOGRAM FOLLOW UP STUDY  01/06/2020   IR ANGIOGRAM FOLLOW UP STUDY  01/06/2020   IR ANGIOGRAM FOLLOW UP STUDY  01/06/2020   IR ANGIOGRAM FOLLOW UP STUDY  01/06/2020   IR ANGIOGRAM FOLLOW UP STUDY  01/06/2020   IR ANGIOGRAM FOLLOW UP STUDY  01/06/2020   IR ANGIOGRAM FOLLOW UP STUDY  01/06/2020   IR ANGIOGRAM FOLLOW UP STUDY  01/06/2020   IR ANGIOGRAM FOLLOW UP STUDY  01/06/2020   IR ANGIOGRAM FOLLOW UP STUDY  01/06/2020   IR NEURO EACH ADD'L AFTER BASIC UNI RIGHT (MS)  01/06/2020   IR TRANSCATH/EMBOLIZ  01/06/2020   MASTECTOMY     MOHS SURGERY N/A    RADIOLOGY WITH ANESTHESIA N/A 01/06/2020   Procedure: IR WITH ANESTHESIA FOR ANEURYSM;  Surgeon: Lanis Pupa, MD;  Location: [facility] OR;  Service: Radiology;  Laterality: N/A;   STENT SUPPORTED EMBOLIZATION OF RIGHT ICA ANEURYSM Right    TUBAL LIGATION     VENTRICULO-PERITONEAL SHUNT PLACEMENT / LAPAROSCOPIC INSERTION PERITONEAL CATHETER N/A    Patient Active Problem List  Diagnosis Date Noted   Dyspareunia in female 04/05/2020   History of abnormal cervical Pap smear 03/10/2020   GIB (gastrointestinal bleeding) 02/28/2020   Elevated BUN    Prediabetes    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   Cerebral vasospasm    Sinus tachycardia    Dysphagia, post-stroke    Thrombocytopenia    Acute blood loss anemia    Brain aneurysm    ICH (intracerebral hemorrhage) (HCC) 01/05/2020   History of adenomatous polyp of colon 02/07/2016   Anatomical narrow angle, bilateral 12/14/2014   Fissure in ano 11/11/2014   Hemorrhoids 11/11/2014   Constipation 11/19/2013   GERD (gastroesophageal reflux disease) 03/24/2013   Irritable bowel syndrome with diarrhea 03/24/2013   Herpes zoster 05/14/2005   Abnormal Pap smear of cervix 05/15/1983   History of cervical dysplasia  05/15/1983    ONSET DATE: 12/02/2023  REFERRING DIAG: I63.9 (ICD-10-CM) - CVA (cerebral vascular accident) (HCC)   THERAPY DIAG:  Muscle weakness (generalized)  Other lack of coordination  Abnormality of gait and mobility  Difficulty in walking, not elsewhere classified  Repeated falls  Other abnormalities of gait and mobility  Rationale for Evaluation and Treatment: Rehabilitation  SUBJECTIVE:                                                                                                                                                                                             SUBJECTIVE STATEMENT:  Pt reports that she is doing okay, stating she wishes she was moving better.   Regarding follow up with family medicine MD yesterday, per pt report recommendation to go to dermatology.  Appointment with dermatologist tomorrow for second opinion on rash.   Pt rating knee pain at 4/10 pain today.   No updates regarding being taken off of plavix .     PERTINENT HISTORY:  Patient reports she was in hospital from [facility]- Sat (July 21st- 25th) with CVA affecting left side. I was supposed to have a knee replacement surgery in September but that is on hold for now. I was doing my prep for my colonoscopy and fell in the bathroom.  Patient reports having mild left sided weakness compared to her CVA in 2021 with heavy Right sided UE weakness. She reports bad OA affecting both knees as well. Previous CVA in 2021 affecting Right side. Reports going to ED  last Monday due to fall/CVA affecting Left side. Diagnosed with R CVA with Left Sided weakness.    PAIN:  Are you having pain? 4/10 bilat knees   PRECAUTIONS: Fall  WEIGHT BEARING RESTRICTIONS: No  FALLS: Has patient fallen in last 6  months? Yes. Number of falls fell with the stroke  PATIENT GOALS: Walk without any assistive device.  OBJECTIVE:  Note: Objective measures were completed at evaluation unless otherwise noted.                                                                                                                               TREATMENT DATE : 03/18/2024  Gait with 300' with 1 standing break SPC used, CGA provided Pt reporting that her knees are bothering her today, pt noting that increased knee pain is the reasoning behind needing break   Pt completed 2 rounds of the following circuit:  5xSTS; 1 UE support for standing, no UE support during eccentric portion Verbal cueing for improved eccentric control 1 min seated rest break Gait 150' without standing rest break, use of SPC, CGA Verbal cueing for improved swing through with SPC  Pt reporting feeling off kilter today  8 min on nustep rolling hill program, 8 min total; levels 1-4; SPM goal of over 55> 422 steps total; average 48 SPM; 0.2 mi total Monitored pt response activity throughout      PATIENT EDUCATION: Education details: PT plan of care; Discussion of symptoms of CVA and to go to ED as soon as possible. Purpose of PT for balance, strength, coordination and endurance.  Person educated: Patient Education method: Explanation Education comprehension: verbalized understanding  HOME EXERCISE PROGRAM: Access Code: [email or web address] URL: [email or web address] Date: 12/17/2023 Prepared by: Sidra Simpers  Program Notes **Be sure to perform all exercises next to a countertop or sturdy piece of furniture in case you become unsteady.  Exercises - Standing Heel Raise with Support  - 1 x daily - 7 x weekly - 2 sets - 15 reps - Standing Toe Raises at Chair  - 1 x daily - 7 x weekly - 2 sets - 15 reps - Mini Squat with Counter Support  - 1 x daily - 7 x weekly - 2 sets - 15 reps - Lunge with Counter Support  - 1 x daily - 7 x weekly - 2 sets - 15 reps - Standing Tandem Balance with Counter Support  - 1 x daily - 7 x weekly - 2 sets - 2 reps - 30 hold - Standing Single Leg Stance with Counter Support  - 1 x daily - 7 x weekly - 2 sets - 2  reps - 30 hold  GOALS: Goals reviewed with patient? Yes  SHORT TERM GOALS: Target date: 01/21/2024  Pt will be independent with HEP in order to improve strength and balance in order to decrease fall risk and improve function at home.  Baseline: EVAL - No current formal HEP in place; 03/04/2024- Patient reports independent with all current HEP and riding a stationary bike.  Goal status: MET   LONG TERM GOALS: Target date: 05/27/2024  1.  Patient will complete five times sit to stand test in < 15 seconds indicating an increased LE  strength and improved balance. Baseline: EVAL= 25.20 sec with LUE Support 9/2: 23.36 without UE support and CGA-min assist. 20.10 sec with LUE support; 03/04/2024- more difficulty raising without UE but performed 18.75 sec with LUE Support Goal status: INITIAL  2.  Patient will increase Berg Balance score by > 6 points to demonstrate decreased fall risk during functional activities. Baseline: EVAL: To be assessed next visit; 12/12/2023= 42/56 9/2: 44/56  10/16:45/56 Goal status: PROGRESSING   3.  Patient will reduce timed up and go to <11 seconds to reduce fall risk and demonstrate improved transfer/gait ability. Baseline: EVAL = 40 sec with RW 9/2: 18.33sec with SPC 10/16:17.47 sec with SPC Goal status: PROGRESSING  4.  Patient will increase 10 meter walk test to >1.11m/s as to improve gait speed for better community ambulation and to reduce fall risk. Baseline: EVAL: 0.65 m/s 9/2: 0.67 m/s with [facility] 9/9: 0.71 m/s without an AD and wearing 2.5 # AW 10/16: .75 m/s with SPC Goal status: PROGRESSING  5. Patient will increase six minute walk test distance by 200 feet for progression to community ambulator and improve gait ability Baseline: EVAL= 482' 9/2: 465 feet  using a SPC with CGA required seated rest break at 4:19min  10/16: 542 ft and full 6 minutes  Goal status: INITIAL   ASSESSMENT:  CLINICAL IMPRESSION:  Pt responded fair to the exercises,  however noted it to be challenging on her knees and the consistent ambulation placed increased stress on the knee joints.  Pt otherwise performed well with the tasks and was able to ambulate with fewer rest breaks with consistent encouragement. Promoted decreased, timed rest breaks this session for improved endurance. Pt will continue to benefit from skilled therapy to address remaining deficits in order to improve overall QoL and return to PLOF.      OBJECTIVE IMPAIRMENTS: Abnormal gait, decreased activity tolerance, decreased balance, decreased coordination, decreased endurance, decreased mobility, difficulty walking, decreased ROM, decreased strength, impaired UE functional use, and pain.   ACTIVITY LIMITATIONS: carrying, lifting, bending, sitting, standing, squatting, sleeping, stairs, and transfers  PARTICIPATION LIMITATIONS: meal prep, cleaning, laundry, driving, shopping, community activity, and yard work  PERSONAL FACTORS: 1-2 comorbidities: OA, previous CVA with significant R sided weakness are also affecting patient's functional outcome.   REHAB POTENTIAL: Good  CLINICAL DECISION MAKING: Stable/uncomplicated  EVALUATION COMPLEXITY: Moderate  PLAN:  PT FREQUENCY: 1-2x/week  PT DURATION: 12 weeks  PLANNED INTERVENTIONS: 97164- PT Re-evaluation, 97750- Physical Performance Testing, 97110-Therapeutic exercises, 97530- Therapeutic activity, [id number]- Neuromuscular re-education, 97535- Self Care, 02859- Manual therapy, [id number]- Gait training, [phone]- Orthotic Initial, [phone]- Orthotic/Prosthetic subsequent, [phone]- Canalith repositioning, [phone]- Electrical stimulation (manual), [phone] (1-2 muscles), 20561 (3+ muscles)- Dry Needling, Patient/Family education, Balance training, Stair training, Taping, Joint mobilization, Joint manipulation, Spinal manipulation, Spinal mobilization, Compression bandaging, Vestibular training, DME instructions, Cryotherapy, and Moist heat  PLAN FOR NEXT SESSION:   Distance walking tolerance Curb step navigation training  BLE functional strengthening Sit<>stands Step-ups  Gait training with LRAD with increased activity tolerance through hall of hospital to reduce self limiting behavior.  Decrease seated rest breaks for improved endurance   Chiquita Silvan, SPT Physical Therapy Student - [facility]  [facility]  03/18/24, 3:39 PM

## 2024-03-19 ENCOUNTER — Ambulatory Visit

## 2024-03-19 DIAGNOSIS — L304 Erythema intertrigo: Secondary | ICD-10-CM | POA: Diagnosis not present

## 2024-03-19 MED ORDER — NYSTATIN 100000 UNIT/GM EX POWD: CUTANEOUS | 4 refills | Status: DC

## 2024-03-19 MED ORDER — TRIAMCINOLONE ACETONIDE 0.1 % EX CREA: TOPICAL_CREAM | CUTANEOUS | 5 refills | Status: AC

## 2024-03-19 NOTE — Patient Instructions (Addendum)
 - Start nystatin (MYCOSTATIN) 100,000 unit/gram ointment; Apply topically Two (2) times a day.   - Start triamcinolone (KENALOG) 0.1 % cream; Apply topically Two (2) times a day, once resolved discontinue.   -Recommend over the counter Zeasorb AF powder to body folds daily after shower.  It is often found in the athlete's foot section in the pharmacy.  Avoid using powders that contain cornstarch.    Steroid Use  We prescribed you a topical steroid at today's visit.   General application instructions: -Apply this to any affected skin areas, twice (2 times) daily, for two (2) weeks -If the areas are better, you can stop -Re-start the topical steroid if the areas come back, or flare -If the areas don't get better after two weeks, we sometimes recommend taking a break for one (1) week, before restarting for another two (2) weeks. Repeat as needed  The most common side effects of topical steroid medications include changes in skin pigment and thinning of the skin. If the steroid is only applied to affected areas of the skin, these effects rarely occur unless the steroid is used for a very long time (years without stopping).   If we prescribed you a strong steroid, please avoid applying to face, groin, or neck, unless we tell you otherwise. We will include more detail in your prescription instructions.    Due to recent changes in healthcare laws, you may see results of your pathology and/or laboratory studies on MyChart before the doctors have had a chance to review them. We understand that in some cases there may be results that are confusing or concerning to you. Please understand that not all results are received at the same time and often the doctors may need to interpret multiple results in order to provide you with the best plan of care or course of treatment. Therefore, we ask that you please give us  2 business days to thoroughly review all your results before contacting the office for  clarification. Should we see a critical lab result, you will be contacted sooner.   If You Need Anything After Your Visit  If you have any questions or concerns for your doctor, please call our main line at [phone] and press option 4 to reach your doctor's medical assistant. If no one answers, please leave a voicemail as directed and we will return your call as soon as possible. Messages left after 4 pm will be answered the following business day.   You may also send us  a message via MyChart. We typically respond to MyChart messages within 1-2 business days.  For prescription refills, please ask your pharmacy to contact our office. Our fax number is [phone].  If you have an urgent issue when the clinic is closed that cannot wait until the next business day, you can page your doctor at the number below.    Please note that while we do our best to be available for urgent issues outside of office hours, we are not available 24/7.   If you have an urgent issue and are unable to reach us , you may choose to seek medical care at your doctor's office, retail clinic, urgent care center, or emergency room.  If you have a medical emergency, please immediately call 911 or go to the emergency department.  Pager Numbers  - Dr. Hester: [phone]  - Dr. Jackquline: [phone]  - Dr. Claudene: [phone]   In the event of inclement weather, please call our main line at [phone] for an  update on the status of any delays or closures.  Dermatology Medication Tips: Please keep the boxes that topical medications come in in order to help keep track of the instructions about where and how to use these. Pharmacies typically print the medication instructions only on the boxes and not directly on the medication tubes.   If your medication is too expensive, please contact our office at [phone] option 4 or send us  a message through MyChart.   We are unable to tell what your co-pay for  medications will be in advance as this is different depending on your insurance coverage. However, we may be able to find a substitute medication at lower cost or fill out paperwork to get insurance to cover a needed medication.   If a prior authorization is required to get your medication covered by your insurance company, please allow us  1-2 business days to complete this process.  Drug prices often vary depending on where the prescription is filled and some pharmacies may offer cheaper prices.  The website www.goodrx.com contains coupons for medications through different pharmacies. The prices here do not account for what the cost may be with help from insurance (it may be cheaper with your insurance), but the website can give you the price if you did not use any insurance.  - You can print the associated coupon and take it with your prescription to the pharmacy.  - You may also stop by our office during regular business hours and pick up a GoodRx coupon card.  - If you need your prescription sent electronically to a different pharmacy, notify our office through [facility] or by phone at [phone] option 4.     Si Usted Necesita Algo Despus de Su Visita  Tambin puede enviarnos un mensaje a travs de Clinical Cytogeneticist. Por lo general respondemos a los mensajes de MyChart en el transcurso de 1 a 2 das hbiles.  Para renovar recetas, por favor pida a su farmacia que se ponga en contacto con nuestra oficina. Randi lakes de fax es [place] [phone].  Si tiene un asunto urgente cuando la clnica est cerrada y que no puede esperar hasta el siguiente da hbil, puede llamar/localizar a su doctor(a) al nmero que aparece a continuacin.   Por favor, tenga en cuenta que aunque hacemos todo lo posible para estar disponibles para asuntos urgentes fuera del horario de [place], no estamos disponibles las 24 horas del da, los 7 [address]  [address] de la [place].   Si tiene un problema urgente y no puede  comunicarse con nosotros, puede optar por buscar atencin mdica  en el consultorio de su doctor(a), en una clnica privada, en un centro de atencin urgente o en una sala de emergencias.  Si tiene engineer, drilling, por favor llame inmediatamente al 911 o vaya a la sala de emergencias.  Nmeros de bper  - Dr. Hester: [phone]  - Dra. Jackquline: [phone]  - Dr. Claudene: [phone]   En caso de inclemencias del tiempo, por favor llame a landry capes principal al [phone] para una actualizacin sobre el [place] de cualquier retraso o cierre.  Consejos para la medicacin en dermatologa: Por favor, guarde las cajas en las que vienen los medicamentos de uso tpico para ayudarle a seguir las instrucciones sobre dnde y cmo usarlos. Las farmacias generalmente imprimen las instrucciones del medicamento slo en las cajas y no directamente en los tubos del [place].   Si su medicamento es pepco holdings, por favor, pngase en contacto con landry oficina  llamando al [phone] y presione la opcin 4 o envenos un mensaje a travs de Clinical Cytogeneticist.   No podemos decirle cul ser su copago por los medicamentos por adelantado ya que esto es diferente dependiendo de la cobertura de su seguro. Sin embargo, es posible que podamos encontrar un medicamento sustituto a audiological scientist un formulario para que el seguro cubra el medicamento que se considera necesario.   Si se requiere una autorizacin previa para que su compaa de seguros cubra su medicamento, por favor permtanos de 1 a 2 das hbiles para completar este proceso.  Los precios de los medicamentos varan con frecuencia dependiendo del environmental consultant de dnde se surte la receta y alguna farmacias pueden ofrecer precios ms baratos.  El sitio web www.goodrx.com tiene cupones para medicamentos de health and safety inspector. Los precios aqu no tienen en cuenta lo que podra costar con la ayuda del seguro (puede ser ms barato con su seguro), pero  el sitio web puede darle el precio si no utiliz tourist information centre manager.  - Puede imprimir el cupn correspondiente y llevarlo con su receta a la farmacia.  - Tambin puede pasar por nuestra oficina durante el horario de atencin regular y education officer, museum una tarjeta de cupones de GoodRx.  - Si necesita que su receta se enve electrnicamente a una farmacia diferente, informe a nuestra oficina a travs de MyChart de [place] o por telfono llamando al [phone] y presione la opcin 4.

## 2024-03-19 NOTE — Progress Notes (Addendum)
    Subjective   Erika Stanton is a 68 y.o. female who presents for the following: Rash. Patient is new patient  Today patient reports: Rash at lower belly subsided in September, but came back about a week ago, itchy. Patient was prescribed ketoconazole 2% cream BID and did help when she initially started rash, but this go around did not help. Patient was then prescribed nystatin but has not picked it up due to issues at pharmacy.   Review of Systems:    No other skin or systemic complaints except as noted in HPI or Assessment and Plan.  The following portions of the chart were reviewed this encounter and updated as appropriate: medications, allergies, medical history  Relevant Medical History:  n/a   Objective  (SKPE) Well appearing patient in no apparent distress; mood and affect are within normal limits. Examination was performed of the: Focused Exam of: Abdomen   Examination notable for: - Erythema of the the infra-pannus area and inguinal folds with superficial erosions and scale.  Examination limited by: Undergarments, Shoes or socks , Clothing, and Patient deferred removal       Assessment & Plan  (SKAP)   Intertrigo - Diagnosis, treatment options, prognosis, risk/ benefit, and side effects of treatment were discussed with the patient.  - Recommended keeping the area dry and clean - Recommended ZeoZorb  - Start nystatin (MYCOSTATIN) 100,000 unit/gram ointment; Apply topically Two (2) times a day.  - Start triamcinolone (KENALOG) 0.1 % cream; Apply topically Two (2) times a day, once resolved discontinue. Repeat prn for flares     Level of service outlined above   Patient instructions (SKPI)   Procedures, orders, diagnosis for this visit:    There are no diagnoses linked to this encounter.  Return to clinic: Return if symptoms worsen or fail to improve, for w/ Dr. Raymund.  I, Jacquelynn V. Wilfred, CMA, am acting as scribe for Lauraine JAYSON Raymund, MD.  Documentation: I  have reviewed the above documentation for accuracy and completeness, and I agree with the above.  Lauraine JAYSON Raymund, MD

## 2024-03-23 ENCOUNTER — Encounter: Payer: Self-pay | Admitting: Physical Therapy

## 2024-03-23 ENCOUNTER — Ambulatory Visit

## 2024-03-23 ENCOUNTER — Encounter: Admitting: Speech Pathology

## 2024-03-23 ENCOUNTER — Ambulatory Visit: Admitting: Physical Therapy

## 2024-03-23 DIAGNOSIS — R278 Other lack of coordination: Secondary | ICD-10-CM

## 2024-03-23 DIAGNOSIS — M6281 Muscle weakness (generalized): Secondary | ICD-10-CM | POA: Diagnosis not present

## 2024-03-23 DIAGNOSIS — R262 Difficulty in walking, not elsewhere classified: Secondary | ICD-10-CM

## 2024-03-23 DIAGNOSIS — R296 Repeated falls: Secondary | ICD-10-CM

## 2024-03-23 DIAGNOSIS — R269 Unspecified abnormalities of gait and mobility: Secondary | ICD-10-CM

## 2024-03-23 DIAGNOSIS — R2689 Other abnormalities of gait and mobility: Secondary | ICD-10-CM

## 2024-03-23 NOTE — Therapy (Addendum)
 OUTPATIENT PHYSICAL THERAPY TREATMENT  Patient Name: Erika Stanton MRN: [id number] DOB:08-04-1955, 68 y.o., female Today's Date: 03/23/2024  PCP: Dr. Oneil Galloway  REFERRING PROVIDER: Dr. Oneil Galloway  END OF SESSION:   PT End of Session - 03/23/24 1534     Visit Number 26    Number of Visits 46    Date for Recertification  05/27/24    PT Start Time 1533    PT Stop Time 1616    PT Time Calculation (min) 43 min    Equipment Utilized During Treatment Gait belt    Activity Tolerance Patient tolerated treatment well               Past Medical History:  Diagnosis Date   Anatomical narrow angle, bilateral    Aneurysm    Asthma    Cerebral aneurysm    Cerebral vasospasm    Cognitive change    Dysphagia, post-stroke    GERD (gastroesophageal reflux disease)    Hemorrhoids    History of cervical dysplasia    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 no recurrent polyps.  suggest repeat surveillance study 11/2022.     COLONOSCOPY W/ POLYPECTOMY  06/2014   Dr Elder at [facility].  3 adenomatoous polyps, anal fissure.     ESOPHAGOGASTRODUODENOSCOPY (EGD) WITH PROPOFOL  N/A 01/27/2020   Procedure: ESOPHAGOGASTRODUODENOSCOPY (EGD) WITH PROPOFOL ;  Surgeon: Shila Gustav GAILS, MD;  Location: [facility] ENDOSCOPY;  Service: Endoscopy;  Laterality: N/A;   INSERTION OF PERMANENT INTRAPERITONEAL CANNULA/CATH N/A    LAPAROSCOPIC   IR 3D INDEPENDENT WKST  01/06/2020   IR ANGIO INTRA EXTRACRAN SEL INTERNAL CAROTID BILAT MOD SED  01/06/2020   IR ANGIO VERTEBRAL SEL  VERTEBRAL UNI L MOD SED  01/06/2020   IR ANGIOGRAM FOLLOW UP STUDY  01/06/2020   IR ANGIOGRAM FOLLOW UP STUDY  01/06/2020   IR ANGIOGRAM FOLLOW UP STUDY  01/06/2020   IR ANGIOGRAM FOLLOW UP STUDY  01/06/2020   IR ANGIOGRAM FOLLOW UP STUDY  01/06/2020   IR ANGIOGRAM FOLLOW UP STUDY  01/06/2020   IR ANGIOGRAM FOLLOW UP STUDY  01/06/2020   IR ANGIOGRAM FOLLOW UP STUDY  01/06/2020   IR ANGIOGRAM FOLLOW UP STUDY  01/06/2020   IR ANGIOGRAM FOLLOW UP STUDY  01/06/2020   IR NEURO EACH ADD'L AFTER BASIC UNI RIGHT (MS)  01/06/2020   IR TRANSCATH/EMBOLIZ  01/06/2020   MASTECTOMY     MOHS SURGERY N/A    RADIOLOGY WITH ANESTHESIA N/A 01/06/2020   Procedure: IR WITH ANESTHESIA FOR ANEURYSM;  Surgeon: Lanis Pupa, MD;  Location: [facility] OR;  Service: Radiology;  Laterality: N/A;   STENT SUPPORTED EMBOLIZATION OF RIGHT ICA ANEURYSM Right    TUBAL LIGATION     VENTRICULO-PERITONEAL SHUNT PLACEMENT / LAPAROSCOPIC INSERTION PERITONEAL CATHETER N/A    Patient Active Problem List   Diagnosis Date Noted   Dyspareunia in female 04/05/2020   History of abnormal cervical Pap smear 03/10/2020   GIB (gastrointestinal bleeding) 02/28/2020  Elevated BUN    Prediabetes    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   Cerebral vasospasm    Sinus tachycardia    Dysphagia, post-stroke    Thrombocytopenia    Acute blood loss anemia    Brain aneurysm    ICH (intracerebral hemorrhage) (HCC) 01/05/2020   History of adenomatous polyp of colon 02/07/2016   Anatomical narrow angle, bilateral 12/14/2014   Fissure in ano 11/11/2014   Hemorrhoids 11/11/2014   Constipation 11/19/2013   GERD (gastroesophageal reflux disease) 03/24/2013   Irritable bowel syndrome with diarrhea 03/24/2013   Herpes zoster 05/14/2005   Abnormal Pap smear of cervix 05/15/1983   History of cervical dysplasia 05/15/1983    ONSET DATE: 12/02/2023  REFERRING DIAG: I63.9 (ICD-10-CM) - CVA (cerebral vascular accident) (HCC)   THERAPY  DIAG:  Muscle weakness (generalized)  Other lack of coordination  Abnormality of gait and mobility  Difficulty in walking, not elsewhere classified  Repeated falls  Other abnormalities of gait and mobility  Rationale for Evaluation and Treatment: Rehabilitation  SUBJECTIVE:                                                                                                                                                                                             SUBJECTIVE STATEMENT:  Pt reports pain in B knees is a 4/10 on NPS today and that they have been hurting. Pt reports the lowest pain in her knees for a while has been a 3/10 on the NPS.   PERTINENT HISTORY:  Patient reports she was in hospital from [facility]- Sat (July 21st- 25th) with CVA affecting left side. I was supposed to have a knee replacement surgery in September but that is on hold for now. I was doing my prep for my colonoscopy and fell in the bathroom.  Patient reports having mild left sided weakness compared to her CVA in 2021 with heavy Right sided UE weakness. She reports bad OA affecting both knees as well. Previous CVA in 2021 affecting Right side. Reports going to ED  last Monday due to fall/CVA affecting Left side. Diagnosed with R CVA with Left Sided weakness.    PAIN:  Are you having pain? 4/10 B knees   PRECAUTIONS: Fall  WEIGHT BEARING RESTRICTIONS: No  FALLS: Has patient fallen in last 6 months? Yes. Number of falls fell with the stroke  PATIENT GOALS: Walk without any assistive device.  OBJECTIVE:  Note: Objective measures were completed at evaluation unless otherwise noted.  TREATMENT DATE : 03/23/2024  -15x sit<>stand with one arm support on chair w/ seated rest breaks between sets of 5  -20x alternating steps on staircase w/ arm rest support, pt asked for rest break  following first 10  -10x B step ups on the staircase, with verbal cuing for upright posture and foot clearance of step  -358ft ambulation, with CGA using SPC, one minute rest break at 172ft due to minor SOB and fatigue  -3x10 seated hip abduction with GTB around knees with cue for control of eccentric motion  - B sidestepping using bar for arm support, 4 laps unweighted, 4 laps with 2# ankle weights  -5 min recipricol pattern on Nustep double rolling hill program, level 3-6, cues for increased SPM    PATIENT EDUCATION: Education details: PT plan of care; Discussion of symptoms of CVA and to go to ED as soon as possible. Purpose of PT for balance, strength, coordination and endurance.  Person educated: Patient Education method: Explanation Education comprehension: verbalized understanding  HOME EXERCISE PROGRAM: Access Code: [email or web address] URL: [email or web address] Date: 12/17/2023 Prepared by: Sidra Simpers  Program Notes **Be sure to perform all exercises next to a countertop or sturdy piece of furniture in case you become unsteady.  Exercises - Standing Heel Raise with Support  - 1 x daily - 7 x weekly - 2 sets - 15 reps - Standing Toe Raises at Chair  - 1 x daily - 7 x weekly - 2 sets - 15 reps - Mini Squat with Counter Support  - 1 x daily - 7 x weekly - 2 sets - 15 reps - Lunge with Counter Support  - 1 x daily - 7 x weekly - 2 sets - 15 reps - Standing Tandem Balance with Counter Support  - 1 x daily - 7 x weekly - 2 sets - 2 reps - 30 hold - Standing Single Leg Stance with Counter Support  - 1 x daily - 7 x weekly - 2 sets - 2 reps - 30 hold  GOALS: Goals reviewed with patient? Yes  SHORT TERM GOALS: Target date: 01/21/2024  Pt will be independent with HEP in order to improve strength and balance in order to decrease fall risk and improve function at home.  Baseline: EVAL - No current formal HEP in place; 03/04/2024- Patient reports independent with all current  HEP and riding a stationary bike.  Goal status: MET   LONG TERM GOALS: Target date: 05/27/2024  1.  Patient will complete five times sit to stand test in < 15 seconds indicating an increased LE strength and improved balance. Baseline: EVAL= 25.20 sec with LUE Support 9/2: 23.36 without UE support and CGA-min assist. 20.10 sec with LUE support; 03/04/2024- more difficulty raising without UE but performed 18.75 sec with LUE Support Goal status: INITIAL  2.  Patient will increase Berg Balance score by > 6 points to demonstrate decreased fall risk during functional activities. Baseline: EVAL: To be assessed next visit; 12/12/2023= 42/56 9/2: 44/56  10/16:45/56 Goal status: PROGRESSING   3.  Patient will reduce timed up and go to <11 seconds to reduce fall risk and demonstrate improved transfer/gait ability. Baseline: EVAL = 40 sec with RW 9/2: 18.33sec with SPC 10/16:17.47 sec with SPC Goal status: PROGRESSING  4.  Patient will increase 10 meter walk test to >1.57m/s as to improve gait speed for better community ambulation and to reduce fall risk. Baseline: EVAL: 0.65 m/s 9/2: 0.67 m/s with SPC 9/9: 0.71 m/s without  an AD and wearing 2.5 # AW 10/16: .75 m/s with SPC Goal status: PROGRESSING  5. Patient will increase six minute walk test distance by 200 feet for progression to community ambulator and improve gait ability Baseline: EVAL= 482' 9/2: 465 feet  using a SPC with CGA required seated rest break at 4:3min  10/16: 542 ft and full 6 minutes  Goal status: INITIAL   ASSESSMENT:  CLINICAL IMPRESSION:  Pt responded well to exercises of today's session and was open to trying newer exercises. Pt was able to increase sit<> stand reps from 10 last session to 15 in today's session with no reported discomfort or fatigue. Added weighted sidestepping with #2 weights to encourage abductor and glute med activation to aid in hip drop during ambulation. Added full alternating step ups onto step  to mimick curb navigation w/ patient requiring verbal cues for upright posture and full foot placement onto step. Added banded GTB hip abduction in order to target glute med strengthening in an unweighted position due to pt reported knee pain. Minor verbal and tactile cues used for full abduction ROM and slowed eccentric control of motion during GTB seated activity. Pt continued to require timed rest breaks due to mild SOB, fatigue, and reported B knee pain. Pt will continue to benefit from skilled therapy to address remaining deficits in order to improve overall QoL and return to PLOF.     OBJECTIVE IMPAIRMENTS: Abnormal gait, decreased activity tolerance, decreased balance, decreased coordination, decreased endurance, decreased mobility, difficulty walking, decreased ROM, decreased strength, impaired UE functional use, and pain.   ACTIVITY LIMITATIONS: carrying, lifting, bending, sitting, standing, squatting, sleeping, stairs, and transfers  PARTICIPATION LIMITATIONS: meal prep, cleaning, laundry, driving, shopping, community activity, and yard work  PERSONAL FACTORS: 1-2 comorbidities: OA, previous CVA with significant R sided weakness are also affecting patient's functional outcome.   REHAB POTENTIAL: Good  CLINICAL DECISION MAKING: Stable/uncomplicated  EVALUATION COMPLEXITY: Moderate  PLAN:  PT FREQUENCY: 1-2x/week  PT DURATION: 12 weeks  PLANNED INTERVENTIONS: 97164- PT Re-evaluation, 97750- Physical Performance Testing, 97110-Therapeutic exercises, 97530- Therapeutic activity, [id number]- Neuromuscular re-education, 97535- Self Care, [id number]- Manual therapy, [id number]- Gait training, [phone]- Orthotic Initial, [phone]- Orthotic/Prosthetic subsequent, [phone]- Canalith repositioning, [phone]- Electrical stimulation (manual), [phone] (1-2 muscles), [phone] (3+ muscles)- Dry Needling, Patient/Family education, Balance training, Stair training, Taping, Joint mobilization, Joint manipulation, Spinal manipulation,  Spinal mobilization, Compression bandaging, Vestibular training, DME instructions, Cryotherapy, and Moist heat  PLAN FOR NEXT SESSION:  -continue to work on improvement of ambulation distance tolerance -decrease time of rest breaks -step navigation of curbs -continue BLE functional strengthening activities Sit<>stands Step-ups  -continue gait training with LRAD w/ increased activity tolerance through hall of hospital to reduce self limiting behavior.    Renna Helling, SPT  03/23/24, 5:31 PM

## 2024-03-24 NOTE — Therapy (Signed)
 Outpatient Occupational Therapy Neuro Treatment Note   Patient Name: Erika Stanton MRN: [id number] DOB:[birth year], 68 y.o., female Today's Date: 03/24/2024  PCP: Laurice Anes, MD REFERRING PROVIDER: Laurice Anes, MD   OT End of Session - 03/24/24 1921     Visit Number 27    Number of Visits 48    Date for Recertification  05/25/24    Authorization Time Period Reporting period beginning 02/20/24    OT Start Time 1445    OT Stop Time 1530    OT Time Calculation (min) 45 min    Equipment Utilized During Treatment transport chair, SPC    Activity Tolerance Patient tolerated treatment well    Behavior During Therapy WFL for tasks assessed/performed         Past Medical History:  Diagnosis Date   Anatomical narrow angle, bilateral    Aneurysm    Asthma    Cerebral aneurysm    Cerebral vasospasm    Cognitive change    Dysphagia, post-stroke    GERD (gastroesophageal reflux disease)    Hemorrhoids    History of cervical dysplasia    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 no recurrent polyps.  suggest repeat surveillance study 11/2022.     COLONOSCOPY W/ POLYPECTOMY  06/2014   Dr Elder at [facility].  3 adenomatoous polyps, anal fissure.     ESOPHAGOGASTRODUODENOSCOPY (EGD) WITH PROPOFOL  N/A 01/27/2020   Procedure: ESOPHAGOGASTRODUODENOSCOPY (EGD) WITH PROPOFOL ;  Surgeon: Shila Gustav GAILS, MD;  Location: [facility] ENDOSCOPY;  Service: Endoscopy;  Laterality: N/A;   INSERTION OF PERMANENT INTRAPERITONEAL CANNULA/CATH N/A    LAPAROSCOPIC   IR 3D  INDEPENDENT WKST  01/06/2020   IR ANGIO INTRA EXTRACRAN SEL INTERNAL CAROTID BILAT MOD SED  01/06/2020   IR ANGIO VERTEBRAL SEL VERTEBRAL UNI L MOD SED  01/06/2020   IR ANGIOGRAM FOLLOW UP STUDY  01/06/2020   IR ANGIOGRAM FOLLOW UP STUDY  01/06/2020   IR ANGIOGRAM FOLLOW UP STUDY  01/06/2020   IR ANGIOGRAM FOLLOW UP STUDY  01/06/2020   IR ANGIOGRAM FOLLOW UP STUDY  01/06/2020   IR ANGIOGRAM FOLLOW UP STUDY  01/06/2020   IR ANGIOGRAM FOLLOW UP STUDY  01/06/2020   IR ANGIOGRAM FOLLOW UP STUDY  01/06/2020   IR ANGIOGRAM FOLLOW UP STUDY  01/06/2020   IR ANGIOGRAM FOLLOW UP STUDY  01/06/2020   IR NEURO EACH ADD'L AFTER BASIC UNI RIGHT (MS)  01/06/2020   IR TRANSCATH/EMBOLIZ  01/06/2020   MASTECTOMY     MOHS SURGERY N/A    RADIOLOGY WITH ANESTHESIA N/A 01/06/2020   Procedure: IR WITH ANESTHESIA FOR ANEURYSM;  Surgeon: Lanis Pupa, MD;  Location: [facility] OR;  Service: Radiology;  Laterality: N/A;   STENT SUPPORTED EMBOLIZATION OF RIGHT ICA ANEURYSM Right    TUBAL LIGATION     VENTRICULO-PERITONEAL SHUNT PLACEMENT / LAPAROSCOPIC INSERTION PERITONEAL CATHETER N/A    Patient Active Problem List   Diagnosis Date Noted   Dyspareunia in female 04/05/2020  History of abnormal cervical Pap smear 03/10/2020   GIB (gastrointestinal bleeding) 02/28/2020   Elevated BUN    Prediabetes    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   Cerebral vasospasm    Sinus tachycardia    Dysphagia, post-stroke    Thrombocytopenia    Acute blood loss anemia    Brain aneurysm    ICH (intracerebral hemorrhage) (HCC) 01/05/2020   History of adenomatous polyp of colon 02/07/2016   Anatomical narrow angle, bilateral 12/14/2014   Fissure in ano 11/11/2014   Hemorrhoids 11/11/2014   Constipation 11/19/2013   GERD (gastroesophageal reflux disease) 03/24/2013   Irritable bowel syndrome with diarrhea 03/24/2013   Herpes zoster 05/14/2005   Abnormal Pap smear of cervix 05/15/1983   History of cervical dysplasia  05/15/1983   ONSET DATE: 12/02/2023  REFERRING DIAG: L ischemic CVA  THERAPY DIAG:  Muscle weakness (generalized)  Other lack of coordination  Rationale for Evaluation and Treatment: Rehabilitation  SUBJECTIVE:  SUBJECTIVE STATEMENT: Pt reports doing well today.  PERTINENT HISTORY:  Pt. was admitted to [facility] from 12/02/23-12/06/23 with an Acute Right MCA CVA, Hydrocephalus with shunt  placement. History of Subarachnoid Hemorrhage with bilateral ICA coiled aneurysms. 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  PRECAUTIONS: None  WEIGHT BEARING RESTRICTIONS: No  PAIN: 03/23/24: No UE pain, 4/10 in knees Are you having pain? No  FALLS: Has patient fallen in last 6 months? Yes. Number of falls 1  LIVING ENVIRONMENT: Lives with: lives with their family and lives with their spouse Lives in: House/apartment Stairs: hand rails, 2 steps   Has following equipment at home: [name], cane  PLOF: Independent with basic ADLs, Spouse help as needed  PATIENT GOALS: Strengthening  OBJECTIVE:  Note: Objective measures were completed at Evaluation unless otherwise noted.  HAND DOMINANCE: Left  ADLs: Overall ADLs: spouse assists when needed, Pt. reports that she can do most tasks herself. Uses her left hand to engage in self-care tasks. Eating: Able to cut foods with a fork, husband assists as needed.  Grooming: husband assists with applying toothpaste, able to brush teeth and hair UB Dressing: Spouse assists with applying/fastening bra, Pt. Requires assistance with zippers, and buttons. LB Dressing: Spouse assists with tying shoes when needed, uses slip on shoes. Pt. Does not zip pants. Toileting: Able to complete  independently Bathing: Pt. Requires intermittent assistance with bathing tasks. Tub Shower transfers: Spouse assists with transfers when/if needed Equipment: Shower seat with back  IADLs: Shopping: Spouse typically does the shopping, isnt able to carry heavy objects.  Light housekeeping: Spouse typically does most of the cleaning around the home, Pt. Cleans bathroom, and laundry Pt. Is unable to carry laundry due to exacerbation of weakness.  Meal Prep: Spouse typically cooks and prepares meals.  Community mobility: Pt. Requires assistance using walker. Pt. reports that she is unable to carry heavy items, and typically does not do the shopping.  Medication management: Pt. is able to take care of meds with pillbox Financial management:  Spouse takes care of it, and makes online payments.  Handwriting: 50% legible  MOBILITY STATUS: Needs Assist: Pt. Requires use of a walker   POSTURE COMMENTS:  No Significant postural limitations Sitting balance: Good  ACTIVITY TOLERANCE: Activity tolerance:   FUNCTIONAL OUTCOME MEASURES: TBD  UPPER EXTREMITY ROM:    Active ROM Right eval Right 01/16/24 Right 03/02/24 Left eval Left 01/16/24 Left 03/02/24  Shoulder flexion 94(121) 100 with scaption (132) 126(146) 102(125) 134 with scaption (145) 142(148)  Shoulder abduction 75(106) 74 (98) 93(120) 110(126) 140 (170) WNL  Shoulder adduction        Shoulder extension        Shoulder internal rotation        Shoulder external rotation        Elbow flexion 135(155)  150(155) 140(150)  150  Elbow extension 0  0 -10(-9)  -4(0)  Wrist flexion 40(44)  48(60) 42(58)  52(60)  Wrist extension 30(34)  48(60) 40(50)  52(56)  Wrist ulnar deviation        Wrist radial deviation        Wrist pronation        Wrist supination        (Blank rows = not tested)  UPPER EXTREMITY MMT:     MMT Right eval Right 03/02/24 Left eval Left 03/02/24  Shoulder flexion 3-/5 3+/5 3-/5 4/5  Shoulder abduction 3-/5  3-/5 3-/5 4/5  Shoulder adduction      Shoulder extension      Shoulder internal rotation      Shoulder external rotation      Middle trapezius      Lower trapezius      Elbow flexion 4-/5 4+/5 4+/5 5/5  Elbow extension 4-/5 4+/5 4+/5 5/5  Wrist flexion 4-/5 4-/5 3+/5 4/5  Wrist extension 4-/5 4-/5 3+/5 4/5  Wrist ulnar deviation      Wrist radial deviation      Wrist pronation      Wrist supination      (Blank rows = not tested)  HAND FUNCTION Eval:  Grip strength: Right: N/T lbs; Left: 43 lbs, Lateral pinch: Right: N/T lbs, Left: 9 lbs, and 3 point pinch: Right: N/T lbs, Left: 5 lbs Pt. Presents with flexor tightness in the right 5th digit, however is able to passively extend 5th digit within normal range. Pt. Also Presents with hyper extension in the 2nd and 3rd digit at PIP joints. Pt. Is able to achieve full Digit MP, PIP, and DIP PROM.  01/16/24: Grip strength: Left: 33 lbs; Lateral pinching: Left: 8 lbs; 3 point pinch: Left: 3 lbs  03/02/24: Grip strength: Left: 36 lbs; Lateral pinching: Left: 8 lbs; 3 point pinch: Left: 3 lbs  COORDINATION: 9 Hole Peg test: Right: N/T sec; Left: 51 sec 01/16/24: Left: 1 min 1 sec 03/02/24:  Left: 1 min 1 sec  SENSATION: Not tested  EDEMA:   MUSCLE TONE: Fluctuating flexor tone in R hand.  COGNITION: Overall cognitive status: Within functional limits for tasks assessed  VISION: Subjective report:  Baseline vision:  Visual history:   VISION ASSESSMENT:  PERCEPTION:   PRAXIS:   OBSERVATIONS: Pt. presents with fluctuating flexor tone in the R hand, involving the 5th digit at the PIP/DIP joint. Pt. presents with hyperextension at the 2nd and 3rd digits at the PIP joints.  TREATMENT DATE: 03/23/24 Therapeutic Exercise: -L pinch strengthening: Facilitated L lateral and 3 point pinch strengthening working with therapy  resistant clothespins (yellow, red, green), moving pins on/off a vertical dowel; min vc and demo for pinch prehension patterns. -L grip strengthening: Hand gripper set at min-mod resistance with 2 red bands; 3 sets 15 reps  Therapeutic Activity: -Facilitated bilat hand coordination and R/LUE forward and lateral reaching patterns, using R hand to reach for clothespins on table top to place into L hand in prep for pinch strengthening activity noted above.  Pt was given intermittent min vc for consistent use of R hand as a stabilizer to set up clips in L hand rather than using torso to stabilize clips.    PATIENT EDUCATION: Education details: engaging R hand as a stabilizer Person educated: Patient Education method: Solicitor, and Verbal cues Education comprehension: verbalized understanding, returned demonstration, verbal cues required, and needs further education  HOME EXERCISE PROGRAM: -Pink theraputty; visual handout issued -Self and caregiver assisted PROM to R hand and wrist -Monitor splint with skin checks  GOALS: Goals reviewed with patient? Yes  SHORT TERM GOALS: Target date: 04/13/2024    Pt. Will be independent with HEP for UE functioning. Baseline: Eval: No current HEP; 01/16/24: pt reports doing a little writing and squeezing a ball, but denies using putty; spouse stretches RUE daily 02/20/24:  Pt. Continues to do a little writing and squeezing a ball, but denies using putty; spouse stretches RUE 03/02/24: Pt. Husband assists Pt. With ROM to the right hand. Pt. reports doing hand exercises. Goal status:Ongoing  LONG TERM GOALS: Target date: 05/25/2024   Pt. Will improve [facility] skills to be able to manipulate small objects at home Baseline: 9 hole peg test: L: 51 seconds; 01/16/24: L 1 min 1 sec, 03/02/24: Left: 1 min. & 1 sec. Pt. Continues to work towards using the left to manipulate small objects. Goal status: Ongoing  2.  Pt. Will increase BUE shoulder ROM by 10  degrees to be able to reach into cabinets and shelves.  Baseline: Shoulder flexion: R: 94(121), L: 102(125), Shoulder abduction: R: 75(106), L: 110(126); 01/16/24: Shoulder flexion: R: 100 with scaption (132), L: 134 with scaption (145), Shoulder abduction: R: 74 (98), L: 140 (170)02/20/24: Pt. Is improving with functional reaching through multiple planes with cues, and assist required. 03/02/24: Shoulder flexion: R: 126(146), L: 142(148), Shoulder abduction: R: 93(120), L: WNL;  Goal status: In progress; Ongoing  3.  Pt. Will improve L grip strength by 5# to be able to securely grasp ADL items at home.  Baseline: Grip strength: R: NT, L: 43#; 01/16/24: L: 33#  03/02/24: 36# Goal status: In progress; Ongoing  4.  Pt. Will increase L lateral key pinch strength by 3#  to open wide mouth jars.  Baseline: Lateral key pinch: R: NT, L: 9#; 01/16/24: L: 8# 03/02/24: Pinch meter is out for calibration. Pt. Continues to have difficulty opening jars Goal status: Ongoing  5.  Pt. Will independently engage the right hand as a gross assist to the left hand 100% of the time during ADLs/IADLs. Baseline: Eval: Pt. With limited engagement of the right hand as a gross assist during tasks; 01/16/24: Observed <25% of the time during OT sessions 02/20/24: Pt. Continues to present with limited engagement of the RUE as a gross assist to the left hand approximately 25% of the time with cues. 03/02/24: Pt. Is starting to engage her right hand more as a gross assist to the left  Goal status: In progress; Ongoing  6.  Pt. Will be able to manipulate zippers, and buttons efficiently with modified independence Baseline: Eval:Right: NT Left: 51 sec; 01/16/24: Left: 1 min and 1 sec 02/20/24: Pt. continues to have difficulty manipulating zippers and buttons.03/02/24: Pt. continues to have difficulty manipulating zippers, and buttons, and may benefit from reviewing adaptive devices. Goal status: Ongoing   7. Pt. Will improve handwriting  legibility to 100% for one sentence in printed form for written correspondence.  Baseline: 50% legible in printed form for first name only; 01/16/24: 80-90% legible to print first name 1 of 4 attempts.  3 of 4 attempts 50% or less legible 02/20/24: first name only 100%, 25% for lists of words. 03/02/24:  first name only 100%, 25% for lists of words.  Goal status: In progress; Ongoing  8. Pt. Will independently, and efficiently transfer small 1/2 objects from the left hand to the right hand in midline in preparation from reaching up  to place items at a target with 100% accuracy with the right hand.  Baseline: 03/02/24: Pt. Has progressed to transferring 1.5 objects between her hand in midline, and reach up to place them at targets with 60% accuracy  Goal Status: New 03/02/24  9. Pt. Will increased BUE strength by 2 mm grades to assist with ADLs, and IADLs.  Baseline: 03/02/24: UE strength: Shoulder flexion: R: 3+/5 L: 4/5, abduction: R: 3-/5, L 4/5:  elbow flexion: R:4+/5, L: 5/5, extension: R: 4+/5, L:5/5, wrist flexion: R: 4-/5  L: 4/5 wrist extension: R: 4-/5 L: 4/5  Goal Status: New: 03/02/24  ASSESSMENT: CLINICAL IMPRESSION: Continued focus on bilat hand coordination skills.  Pt was given intermittent min vc for consistent use of R hand as a stabilizer to set up clips in L hand rather than using torso to stabilize clips.  Pt acknowledged needing to use her R hand more to help the L with activities at home.  Overall good tolerance to activities noted above.  Pt. continues to benefit from OT services to increase BUE ROM in the shoulder, wrist and hands, as well as increasing L grip/pinch strength and improve [facility] skills to be able to perform her desired ADL/IADLs.    PERFORMANCE DEFICITS: in functional skills including ADLs, IADLs, coordination, dexterity, edema, tone, ROM, strength, pain, Fine motor control, Gross motor control, endurance, and UE functional use, and psychosocial skills including  coping strategies, environmental adaptation, habits, and routines and behaviors.   IMPAIRMENTS: are limiting patient from ADLs, IADLs, rest and sleep, leisure, and social participation.   CO-MORBIDITIES: may have co-morbidities  that affects occupational performance. Patient will benefit from skilled OT to address above impairments and improve overall function.  MODIFICATION OR ASSISTANCE TO COMPLETE EVALUATION: Min-Moderate modification of tasks or assist with assess necessary to complete an evaluation.  OT OCCUPATIONAL PROFILE AND HISTORY: Detailed assessment: Review of records and additional review of physical, cognitive, psychosocial history related to current functional performance.  CLINICAL DECISION MAKING: Moderate - several treatment options, min-mod task modification necessary  REHAB POTENTIAL: Good  EVALUATION COMPLEXITY: Moderate  PLAN:  OT FREQUENCY: 2x/week  OT DURATION: 12 weeks  PLANNED INTERVENTIONS: 97168 OT Re-evaluation, 97535 self care/ADL training, 02889 therapeutic exercise, 97530 therapeutic activity, 97112 neuromuscular re-education, 97140 manual therapy, 97018 paraffin, 02960 fluidotherapy, 97010 moist heat, 97010 cryotherapy, 97034 contrast bath, 97032 electrical stimulation (manual), 97760 Orthotic Initial, passive range of motion, energy conservation, coping strategies training, patient/family education, and DME and/or AE instructions  RECOMMENDED OTHER SERVICES: PT  CONSULTED AND AGREED WITH PLAN OF CARE: Patient  PLAN FOR NEXT SESSION: Treatment   Inocente Blazing, MS, OTR/L

## 2024-03-25 ENCOUNTER — Encounter: Admitting: Speech Pathology

## 2024-03-25 ENCOUNTER — Ambulatory Visit

## 2024-03-25 ENCOUNTER — Encounter: Payer: Self-pay | Admitting: Physical Therapy

## 2024-03-25 ENCOUNTER — Ambulatory Visit: Admitting: Physical Therapy

## 2024-03-25 DIAGNOSIS — R278 Other lack of coordination: Secondary | ICD-10-CM

## 2024-03-25 DIAGNOSIS — M6281 Muscle weakness (generalized): Secondary | ICD-10-CM | POA: Diagnosis not present

## 2024-03-25 DIAGNOSIS — R2689 Other abnormalities of gait and mobility: Secondary | ICD-10-CM

## 2024-03-25 DIAGNOSIS — R296 Repeated falls: Secondary | ICD-10-CM

## 2024-03-25 DIAGNOSIS — R262 Difficulty in walking, not elsewhere classified: Secondary | ICD-10-CM

## 2024-03-25 DIAGNOSIS — R269 Unspecified abnormalities of gait and mobility: Secondary | ICD-10-CM

## 2024-03-25 NOTE — Therapy (Signed)
 OUTPATIENT PHYSICAL THERAPY TREATMENT  Patient Name: Erika Stanton MRN: [id number] DOB:12/01/1955, 68 y.o., female Today's Date: 03/25/2024  PCP: Dr. Oneil Galloway  REFERRING PROVIDER: Dr. 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 no recurrent polyps.  suggest repeat surveillance study 11/2022.     COLONOSCOPY W/ POLYPECTOMY  06/2014   Dr Elder at [facility].  3 adenomatoous polyps, anal fissure.     ESOPHAGOGASTRODUODENOSCOPY (EGD) WITH PROPOFOL  N/A 01/27/2020   Procedure: ESOPHAGOGASTRODUODENOSCOPY (EGD) WITH PROPOFOL ;  Surgeon: Shila Gustav GAILS, MD;  Location: [facility] ENDOSCOPY;  Service: Endoscopy;  Laterality: N/A;   INSERTION OF PERMANENT INTRAPERITONEAL CANNULA/CATH N/A    LAPAROSCOPIC   IR 3D INDEPENDENT WKST  01/06/2020   IR ANGIO INTRA EXTRACRAN SEL INTERNAL CAROTID BILAT MOD SED  01/06/2020   IR ANGIO VERTEBRAL SEL VERTEBRAL UNI L MOD SED  01/06/2020   IR  ANGIOGRAM FOLLOW UP STUDY  01/06/2020   IR ANGIOGRAM FOLLOW UP STUDY  01/06/2020   IR ANGIOGRAM FOLLOW UP STUDY  01/06/2020   IR ANGIOGRAM FOLLOW UP STUDY  01/06/2020   IR ANGIOGRAM FOLLOW UP STUDY  01/06/2020   IR ANGIOGRAM FOLLOW UP STUDY  01/06/2020   IR ANGIOGRAM FOLLOW UP STUDY  01/06/2020   IR ANGIOGRAM FOLLOW UP STUDY  01/06/2020   IR ANGIOGRAM FOLLOW UP STUDY  01/06/2020   IR ANGIOGRAM FOLLOW UP STUDY  01/06/2020   IR NEURO EACH ADD'L AFTER BASIC UNI RIGHT (MS)  01/06/2020   IR TRANSCATH/EMBOLIZ  01/06/2020   MASTECTOMY     MOHS SURGERY N/A    RADIOLOGY WITH ANESTHESIA N/A 01/06/2020   Procedure: IR WITH ANESTHESIA FOR ANEURYSM;  Surgeon: Lanis Pupa, MD;  Location: [facility] OR;  Service: Radiology;  Laterality: N/A;   STENT SUPPORTED EMBOLIZATION OF RIGHT ICA ANEURYSM Right    TUBAL LIGATION     VENTRICULO-PERITONEAL SHUNT PLACEMENT / LAPAROSCOPIC INSERTION PERITONEAL CATHETER N/A    Patient Active Problem List   Diagnosis Date Noted   Dyspareunia in female 04/05/2020   History of abnormal cervical Pap smear 03/10/2020   GIB (gastrointestinal bleeding) 02/28/2020   Elevated BUN    Prediabetes  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   Cerebral vasospasm    Sinus tachycardia    Dysphagia, post-stroke    Thrombocytopenia    Acute blood loss anemia    Brain aneurysm    ICH (intracerebral hemorrhage) (HCC) 01/05/2020   History of adenomatous polyp of colon 02/07/2016   Anatomical narrow angle, bilateral 12/14/2014   Fissure in ano 11/11/2014   Hemorrhoids 11/11/2014   Constipation 11/19/2013   GERD (gastroesophageal reflux disease) 03/24/2013   Irritable bowel syndrome with diarrhea 03/24/2013   Herpes zoster 05/14/2005   Abnormal Pap smear of cervix 05/15/1983   History of cervical dysplasia 05/15/1983    ONSET DATE: 12/02/2023  REFERRING DIAG: I63.9 (ICD-10-CM) - CVA (cerebral vascular accident) (HCC)   THERAPY DIAG:  Muscle weakness  (generalized)  Difficulty in walking, not elsewhere classified  Other lack of coordination  Abnormality of gait and mobility  Other abnormalities of gait and mobility  Repeated falls  Rationale for Evaluation and Treatment: Rehabilitation  SUBJECTIVE:                                                                                                                                                                                             SUBJECTIVE STATEMENT:  Pt reports feeling okay today. Pt reports she had a good OT session. Reports continued chronic pain in B knees.   PERTINENT HISTORY:  Patient reports she was in hospital from [facility]- Sat (July 21st- 25th) with CVA affecting left side. I was supposed to have a knee replacement surgery in September but that is on hold for now. I was doing my prep for my colonoscopy and fell in the bathroom.  Patient reports having mild left sided weakness compared to her CVA in 2021 with heavy Right sided UE weakness. She reports bad OA affecting both knees as well. Previous CVA in 2021 affecting Right side. Reports going to ED  last Monday due to fall/CVA affecting Left side. Diagnosed with R CVA with Left Sided weakness.    PAIN:  Are you having pain? 4/10 B knees   PRECAUTIONS: Fall  WEIGHT BEARING RESTRICTIONS: No  FALLS: Has patient fallen in last 6 months? Yes. Number of falls fell with the stroke  PATIENT GOALS: Walk without any assistive device.  OBJECTIVE:  Note: Objective measures were completed at evaluation unless otherwise noted.  TREATMENT DATE : 03/25/2024  -Nustep reciprocal L UE and B LE movement pattern, seat 10, arms 11, 7 minute duration, rolling hill program, level 3-6, cue for >50 SPM  -176ft ambulation with SPC before standing rest break, followed by 266ft ambulation before seated rest  break  -10x sit<>stand with one arm support on chair w/ seated rest break between sets of 5  -58ft ambulation w/ SPC   -B sidestepping using 2 fingers on L hand for stability on raised plinth, 5 laps back<>forth, B 2# ankle weights  -LAQ w/ 2# ankle weights, 3x10, cues for full knee ext/flex ROM   PATIENT EDUCATION: Education details: PT plan of care; Discussion of symptoms of CVA and to go to ED as soon as possible. Purpose of PT for balance, strength, coordination and endurance.  Person educated: Patient Education method: Explanation Education comprehension: verbalized understanding  HOME EXERCISE PROGRAM: Access Code: [email or web address] URL: [email or web address] Date: 12/17/2023 Prepared by: Sidra Simpers  Program Notes **Be sure to perform all exercises next to a countertop or sturdy piece of furniture in case you become unsteady.  Exercises - Standing Heel Raise with Support  - 1 x daily - 7 x weekly - 2 sets - 15 reps - Standing Toe Raises at Chair  - 1 x daily - 7 x weekly - 2 sets - 15 reps - Mini Squat with Counter Support  - 1 x daily - 7 x weekly - 2 sets - 15 reps - Lunge with Counter Support  - 1 x daily - 7 x weekly - 2 sets - 15 reps - Standing Tandem Balance with Counter Support  - 1 x daily - 7 x weekly - 2 sets - 2 reps - 30 hold - Standing Single Leg Stance with Counter Support  - 1 x daily - 7 x weekly - 2 sets - 2 reps - 30 hold  GOALS: Goals reviewed with patient? Yes  SHORT TERM GOALS: Target date: 01/21/2024  Pt will be independent with HEP in order to improve strength and balance in order to decrease fall risk and improve function at home.  Baseline: EVAL - No current formal HEP in place; 03/04/2024- Patient reports independent with all current HEP and riding a stationary bike.  Goal status: MET   LONG TERM GOALS: Target date: 05/27/2024  1.  Patient will complete five times sit to stand test in < 15 seconds indicating an increased LE strength  and improved balance. Baseline: EVAL= 25.20 sec with LUE Support 9/2: 23.36 without UE support and CGA-min assist. 20.10 sec with LUE support; 03/04/2024- more difficulty raising without UE but performed 18.75 sec with LUE Support Goal status: INITIAL  2.  Patient will increase Berg Balance score by > 6 points to demonstrate decreased fall risk during functional activities. Baseline: EVAL: To be assessed next visit; 12/12/2023= 42/56 9/2: 44/56  10/16:45/56 Goal status: PROGRESSING   3.  Patient will reduce timed up and go to <11 seconds to reduce fall risk and demonstrate improved transfer/gait ability. Baseline: EVAL = 40 sec with RW 9/2: 18.33sec with SPC 10/16:17.47 sec with SPC Goal status: PROGRESSING  4.  Patient will increase 10 meter walk test to >1.39m/s as to improve gait speed for better community ambulation and to reduce fall risk. Baseline: EVAL: 0.65 m/s 9/2: 0.67 m/s with [facility] 9/9: 0.71 m/s without an AD and wearing 2.5 # AW 10/16: .75 m/s with SPC Goal status: PROGRESSING  5. Patient will increase six minute walk test distance  by 200 feet for progression to community ambulator and improve gait ability Baseline: EVAL= 482' 9/2: 465 feet  using a SPC with CGA required seated rest break at 4:36min  10/16: 542 ft and full 6 minutes  Goal status: INITIAL   ASSESSMENT:  CLINICAL IMPRESSION:   Pt responded well to exercises in today's session. Began with Nustep for neurologic priming of LE muscles for ambulation and to encourage reciprocal UE and LE movement during ambulation. Pt was encouraged to take fewer and decreased duration of rest breaks during ambulation to promote increased stamina and cardiorespiratory fitness. Continued w/ weighted sidestepping exercise using #2 ankle weights to activate abductors and glute med to decrease hip drop during ambulation, this time on raised plinth with only 2 finger support on mat. Pt performed seated LAQ to promote quad activation and  knee extension during gait, and pt required verbal and tactile cuing for proper foot placement and ROM. Pt continued to require timed rest breaks due to mild SOB, fatigue, and reported B knee pain. Pt will continue to benefit from skilled therapy to address remaining deficits in order to improve overall QoL and return to PLOF.   OBJECTIVE IMPAIRMENTS: Abnormal gait, decreased activity tolerance, decreased balance, decreased coordination, decreased endurance, decreased mobility, difficulty walking, decreased ROM, decreased strength, impaired UE functional use, and pain.   ACTIVITY LIMITATIONS: carrying, lifting, bending, sitting, standing, squatting, sleeping, stairs, and transfers  PARTICIPATION LIMITATIONS: meal prep, cleaning, laundry, driving, shopping, community activity, and yard work  PERSONAL FACTORS: 1-2 comorbidities: OA, previous CVA with significant R sided weakness are also affecting patient's functional outcome.   REHAB POTENTIAL: Good  CLINICAL DECISION MAKING: Stable/uncomplicated  EVALUATION COMPLEXITY: Moderate  PLAN:  PT FREQUENCY: 1-2x/week  PT DURATION: 12 weeks  PLANNED INTERVENTIONS: 97164- PT Re-evaluation, 97750- Physical Performance Testing, 97110-Therapeutic exercises, 97530- Therapeutic activity, [id number]- Neuromuscular re-education, 97535- Self Care, [id number]- Manual therapy, [id number]- Gait training, [phone]- Orthotic Initial, [phone]- Orthotic/Prosthetic subsequent, [phone]- Canalith repositioning, [phone]- Electrical stimulation (manual), [phone] (1-2 muscles), [id number] (3+ muscles)- Dry Needling, Patient/Family education, Balance training, Stair training, Taping, Joint mobilization, Joint manipulation, Spinal manipulation, Spinal mobilization, Compression bandaging, Vestibular training, DME instructions, Cryotherapy, and Moist heat  PLAN FOR NEXT SESSION:  -continue to work on improvement of ambulation distance tolerance -decrease time of rest breaks -step navigation of  curbs -continue BLE functional strengthening activities -continue gait training with LRAD w/ increased activity tolerance through hall of hospital to reduce self limiting behavior   Renna Helling, SPT  03/25/24, 3:02 PM

## 2024-03-28 NOTE — Therapy (Signed)
 Outpatient Occupational Therapy Neuro Treatment Note   Patient Name: Erika Stanton MRN: [id number] DOB:11-09-1955, 68 y.o., female Today's Date: 03/28/2024  PCP: Erika Anes, MD REFERRING PROVIDER: Laurice Anes, MD   OT End of Session - 03/28/24 1941     Visit Number 29    Number of Visits 48    Date for Recertification  05/25/24    Authorization Time Period Reporting period beginning 02/20/24    OT Start Time 1401    OT Stop Time 1445    OT Time Calculation (min) 44 min    Equipment Utilized During Treatment transport chair, SPC    Activity Tolerance Patient tolerated treatment well    Behavior During Therapy WFL for tasks assessed/performed         Past Medical History:  Diagnosis Date   Anatomical narrow angle, bilateral    Aneurysm    Asthma    Cerebral aneurysm    Cerebral vasospasm    Cognitive change    Dysphagia, post-stroke    GERD (gastroesophageal reflux disease)    Hemorrhoids    History of cervical dysplasia    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 no recurrent polyps.  suggest repeat surveillance study 11/2022.     COLONOSCOPY W/ POLYPECTOMY  06/2014   Dr Erika Stanton at [facility].  3 adenomatoous polyps, anal fissure.     ESOPHAGOGASTRODUODENOSCOPY (EGD) WITH PROPOFOL  N/A 01/27/2020   Procedure: ESOPHAGOGASTRODUODENOSCOPY (EGD) WITH PROPOFOL ;  Surgeon: Erika Gustav GAILS, MD;  Location: [facility] ENDOSCOPY;  Service: Endoscopy;  Laterality: N/A;   INSERTION OF PERMANENT INTRAPERITONEAL CANNULA/CATH N/A    LAPAROSCOPIC   IR 3D  INDEPENDENT WKST  01/06/2020   IR ANGIO INTRA EXTRACRAN SEL INTERNAL CAROTID BILAT MOD SED  01/06/2020   IR ANGIO VERTEBRAL SEL VERTEBRAL UNI L MOD SED  01/06/2020   IR ANGIOGRAM FOLLOW UP STUDY  01/06/2020   IR ANGIOGRAM FOLLOW UP STUDY  01/06/2020   IR ANGIOGRAM FOLLOW UP STUDY  01/06/2020   IR ANGIOGRAM FOLLOW UP STUDY  01/06/2020   IR ANGIOGRAM FOLLOW UP STUDY  01/06/2020   IR ANGIOGRAM FOLLOW UP STUDY  01/06/2020   IR ANGIOGRAM FOLLOW UP STUDY  01/06/2020   IR ANGIOGRAM FOLLOW UP STUDY  01/06/2020   IR ANGIOGRAM FOLLOW UP STUDY  01/06/2020   IR ANGIOGRAM FOLLOW UP STUDY  01/06/2020   IR NEURO EACH ADD'L AFTER BASIC UNI RIGHT (MS)  01/06/2020   IR TRANSCATH/EMBOLIZ  01/06/2020   MASTECTOMY     MOHS SURGERY N/A    RADIOLOGY WITH ANESTHESIA N/A 01/06/2020   Procedure: IR WITH ANESTHESIA FOR ANEURYSM;  Surgeon: Erika Pupa, MD;  Location: [facility] OR;  Service: Radiology;  Laterality: N/A;   STENT SUPPORTED EMBOLIZATION OF RIGHT ICA ANEURYSM Right    TUBAL LIGATION     VENTRICULO-PERITONEAL SHUNT PLACEMENT / LAPAROSCOPIC INSERTION PERITONEAL CATHETER N/A    Patient Active Problem List   Diagnosis Date Noted   Dyspareunia in female 04/05/2020  History of abnormal cervical Pap smear 03/10/2020   GIB (gastrointestinal bleeding) 02/28/2020   Elevated BUN    Prediabetes    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   Cerebral vasospasm    Sinus tachycardia    Dysphagia, post-stroke    Thrombocytopenia    Acute blood loss anemia    Brain aneurysm    ICH (intracerebral hemorrhage) (HCC) 01/05/2020   History of adenomatous polyp of colon 02/07/2016   Anatomical narrow angle, bilateral 12/14/2014   Fissure in ano 11/11/2014   Hemorrhoids 11/11/2014   Constipation 11/19/2013   GERD (gastroesophageal reflux disease) 03/24/2013   Irritable bowel syndrome with diarrhea 03/24/2013   Herpes zoster 05/14/2005   Abnormal Pap smear of cervix 05/15/1983   History of cervical dysplasia  05/15/1983   ONSET DATE: 12/02/2023  REFERRING DIAG: L ischemic CVA  THERAPY DIAG:  Muscle weakness (generalized)  Other lack of coordination  Rationale for Evaluation and Treatment: Rehabilitation  SUBJECTIVE:  SUBJECTIVE STATEMENT: Pt reports doing well today.  PERTINENT HISTORY:  Pt. was [facility] to [facility] from 12/02/23-12/06/23 with an Acute Right MCA CVA, Hydrocephalus with shunt  placement. History of Subarachnoid Hemorrhage with bilateral ICA coiled aneurysms. 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  PRECAUTIONS: None  WEIGHT BEARING RESTRICTIONS: No  PAIN: 03/25/24: No UE pain, 4/10 in knees Are you having pain? No  FALLS: Has patient fallen in last 6 months? Yes. Number of falls 1  LIVING ENVIRONMENT: Lives with: lives with their family and lives with their spouse Lives in: House/apartment Stairs: hand rails, 2 steps   Has following equipment at home: [name], cane  PLOF: Independent with basic ADLs, Spouse help as needed  PATIENT GOALS: Strengthening  OBJECTIVE:  Note: Objective measures were completed at Evaluation unless otherwise noted.  HAND DOMINANCE: Left  ADLs: Overall ADLs: spouse assists when needed, Pt. reports that she can do most tasks herself. Uses her left hand to engage in self-care tasks. Eating: Able to cut foods with a fork, husband assists as needed.  Grooming: husband assists with applying toothpaste, able to brush teeth and hair UB Dressing: Spouse assists with applying/fastening bra, Pt. Requires assistance with zippers, and buttons. LB Dressing: Spouse assists with tying shoes when needed, uses slip on shoes. Pt. Does not zip pants. Toileting: Able to complete  independently Bathing: Pt. Requires intermittent assistance with bathing tasks. Tub Shower transfers: Spouse assists with transfers when/if needed Equipment: Shower seat with back  IADLs: Shopping: Spouse typically does the shopping, isnt able to carry heavy objects.  Light housekeeping: Spouse typically does most of the cleaning around the home, Pt. Cleans bathroom, and laundry Pt. Is unable to carry laundry due to exacerbation of weakness.  Meal Prep: Spouse typically cooks and prepares meals.  Community mobility: Pt. Requires assistance using walker. Pt. reports that she is unable to carry heavy items, and typically does not do the shopping.  Medication management: Pt. is able to take care of meds with pillbox Financial management:  Spouse takes care of it, and makes online payments.  Handwriting: 50% legible  MOBILITY STATUS: Needs Assist: Pt. Requires use of a walker   POSTURE COMMENTS:  No Significant postural limitations Sitting balance: Good  ACTIVITY TOLERANCE: Activity tolerance:   FUNCTIONAL OUTCOME MEASURES: TBD  UPPER EXTREMITY ROM:    Active ROM Right eval Right 01/16/24 Right 03/02/24 Left eval Left 01/16/24 Left 03/02/24  Shoulder flexion 94(121) 100 with scaption (132) 126(146) 102(125) 134 with scaption (145) 142(148)  Shoulder abduction 75(106) 74 (98) 93(120) 110(126) 140 (170) WNL  Shoulder adduction        Shoulder extension        Shoulder internal rotation        Shoulder external rotation        Elbow flexion 135(155)  150(155) 140(150)  150  Elbow extension 0  0 -10(-9)  -4(0)  Wrist flexion 40(44)  48(60) 42(58)  52(60)  Wrist extension 30(34)  48(60) 40(50)  52(56)  Wrist ulnar deviation        Wrist radial deviation        Wrist pronation        Wrist supination        (Blank rows = not tested)  UPPER EXTREMITY MMT:     MMT Right eval Right 03/02/24 Left eval Left 03/02/24  Shoulder flexion 3-/5 3+/5 3-/5 4/5  Shoulder abduction 3-/5  3-/5 3-/5 4/5  Shoulder adduction      Shoulder extension      Shoulder internal rotation      Shoulder external rotation      Middle trapezius      Lower trapezius      Elbow flexion 4-/5 4+/5 4+/5 5/5  Elbow extension 4-/5 4+/5 4+/5 5/5  Wrist flexion 4-/5 4-/5 3+/5 4/5  Wrist extension 4-/5 4-/5 3+/5 4/5  Wrist ulnar deviation      Wrist radial deviation      Wrist pronation      Wrist supination      (Blank rows = not tested)  HAND FUNCTION Eval:  Grip strength: Right: N/T lbs; Left: 43 lbs, Lateral pinch: Right: N/T lbs, Left: 9 lbs, and 3 point pinch: Right: N/T lbs, Left: 5 lbs Pt. Presents with flexor tightness in the right 5th digit, however is able to passively extend 5th digit within normal range. Pt. Also Presents with hyper extension in the 2nd and 3rd digit at PIP joints. Pt. Is able to achieve full Digit MP, PIP, and DIP PROM.  01/16/24: Grip strength: Left: 33 lbs; Lateral pinching: Left: 8 lbs; 3 point pinch: Left: 3 lbs  03/02/24: Grip strength: Left: 36 lbs; Lateral pinching: Left: 8 lbs; 3 point pinch: Left: 3 lbs  COORDINATION: 9 Hole Peg test: Right: N/T sec; Left: 51 sec 01/16/24: Left: 1 min 1 sec 03/02/24:  Left: 1 min 1 sec  SENSATION: Not tested  EDEMA:   MUSCLE TONE: Fluctuating flexor tone in R hand.  COGNITION: Overall cognitive status: Within functional limits for tasks assessed  VISION: Subjective report:  Baseline vision:  Visual history:   VISION ASSESSMENT:  PERCEPTION:   PRAXIS:   OBSERVATIONS: Pt. presents with fluctuating flexor tone in the R hand, involving the 5th digit at the PIP/DIP joint. Pt. presents with hyperextension at the 2nd and 3rd digits at the PIP joints.  TREATMENT DATE: 03/25/24 Therapeutic Exercise: -L grip strengthening: Hand gripper set at 11.2# to remove jumbo pegs from pegboard x2  trials  Therapeutic Activity: -Facilitated bilat hand coordination and R/LUE forward and lateral reaching patterns, using R hand to reach for jumbo pegs positioned to pt's R on table top, then transferring peg to L hand to place peg into pegboard.  Pt was given intermittent min vc for consistent use of R hand as a stabilizer, and did make a few successful attempts to place a peg into pegboard with R hand.   PATIENT EDUCATION: Education details: engaging R hand as a stabilizer Person educated: Patient Education method: Solicitor, and Verbal cues Education comprehension: verbalized understanding, returned demonstration, verbal cues required, and needs further education  HOME EXERCISE PROGRAM: -Pink theraputty; visual handout issued -Self and caregiver assisted PROM to R hand and wrist -Monitor splint with skin checks  GOALS: Goals reviewed with patient? Yes  SHORT TERM GOALS: Target date: 04/13/2024    Pt. Will be independent with HEP for UE functioning. Baseline: Eval: No current HEP; 01/16/24: pt reports doing a little writing and squeezing a ball, but denies using putty; spouse stretches RUE daily 02/20/24:  Pt. Continues to do a little writing and squeezing a ball, but denies using putty; spouse stretches RUE 03/02/24: Pt. Husband assists Pt. With ROM to the right hand. Pt. reports doing hand exercises. Goal status:Ongoing  LONG TERM GOALS: Target date: 05/25/2024   Pt. Will improve [facility] skills to be able to manipulate small objects at home Baseline: 9 hole peg test: L: 51 seconds; 01/16/24: L 1 min 1 sec, 03/02/24: Left: 1 min. & 1 sec. Pt. Continues to work towards using the left to manipulate small objects. Goal status: Ongoing  2.  Pt. Will increase BUE shoulder ROM by 10 degrees to be able to reach into cabinets and shelves.  Baseline: Shoulder flexion: R: 94(121), L: 102(125), Shoulder abduction: R: 75(106), L: 110(126); 01/16/24: Shoulder flexion: R: 100 with  scaption (132), L: 134 with scaption (145), Shoulder abduction: R: 74 (98), L: 140 (170)02/20/24: Pt. Is improving with functional reaching through multiple planes with cues, and assist required. 03/02/24: Shoulder flexion: R: 126(146), L: 142(148), Shoulder abduction: R: 93(120), L: WNL;  Goal status: In progress; Ongoing  3.  Pt. Will improve L grip strength by 5# to be able to securely grasp ADL items at home.  Baseline: Grip strength: R: NT, L: 43#; 01/16/24: L: 33#  03/02/24: 36# Goal status: In progress; Ongoing  4.  Pt. Will increase L lateral key pinch strength by 3#  to open wide mouth jars.  Baseline: Lateral key pinch: R: NT, L: 9#; 01/16/24: L: 8# 03/02/24: Pinch meter is out for calibration. Pt. Continues to have difficulty opening jars Goal status: Ongoing  5.  Pt. Will independently engage the right hand as a gross assist to the left hand 100% of the time during ADLs/IADLs. Baseline: Eval: Pt. With limited engagement of the right hand as a gross assist during tasks; 01/16/24: Observed <25% of the time during OT sessions 02/20/24: Pt. Continues to present with limited engagement of the RUE as a gross assist to the left hand approximately 25% of the time with cues. 03/02/24: Pt. Is starting to engage her right hand more as a gross assist to the left  Goal status: In progress; Ongoing  6.  Pt. Will be able to manipulate zippers, and buttons efficiently with modified independence Baseline: Eval:Right: NT Left: 51 sec; 01/16/24: Left:  1 min and 1 sec 02/20/24: Pt. continues to have difficulty manipulating zippers and buttons.03/02/24: Pt. continues to have difficulty manipulating zippers, and buttons, and may benefit from reviewing adaptive devices. Goal status: Ongoing   7. Pt. Will improve handwriting legibility to 100% for one sentence in printed form for written correspondence.  Baseline: 50% legible in printed form for first name only; 01/16/24: 80-90% legible to print first name 1 of 4  attempts.  3 of 4 attempts 50% or less legible 02/20/24: first name only 100%, 25% for lists of words. 03/02/24:  first name only 100%, 25% for lists of words.  Goal status: In progress; Ongoing  8. Pt. Will independently, and efficiently transfer small 1/2 objects from the left hand to the right hand in midline in preparation from reaching up  to place items at a target with 100% accuracy with the right hand.  Baseline: 03/02/24: Pt. Has progressed to transferring 1.5 objects between her hand in midline, and reach up to place them at targets with 60% accuracy  Goal Status: New 03/02/24  9. Pt. Will increased BUE strength by 2 mm grades to assist with ADLs, and IADLs.  Baseline: 03/02/24: UE strength: Shoulder flexion: R: 3+/5 L: 4/5, abduction: R: 3-/5, L 4/5:  elbow flexion: R:4+/5, L: 5/5, extension: R: 4+/5, L:5/5, wrist flexion: R: 4-/5  L: 4/5 wrist extension: R: 4-/5 L: 4/5  Goal Status: New: 03/02/24  ASSESSMENT: CLINICAL IMPRESSION: Continued focus on bilat hand coordination skills.  Pt was given intermittent min vc for consistent use of R hand as a stabilizer to set up jumbo pegs, reaching for pegs on table top with R hand and transferring them to L hand in prep to place pegs into pegboard.  Pt was able to place a few pegs into pegboard with R hand.  Pt was able to increase hand gripper resistance today from 6.6# to 11.2# to remove pegs from pegboard using L hand, with only a few dropped pegs on 2nd trial.  Overall good tolerance to activities noted above.  Pt. continues to benefit from OT services to increase BUE ROM in the shoulder, wrist and hands, as well as increasing L grip/pinch strength and improve [facility] skills to be able to perform her desired ADL/IADLs.    PERFORMANCE DEFICITS: in functional skills including ADLs, IADLs, coordination, dexterity, edema, tone, ROM, strength, pain, Fine motor control, Gross motor control, endurance, and UE functional use, and psychosocial skills  including coping strategies, environmental adaptation, habits, and routines and behaviors.   IMPAIRMENTS: are limiting patient from ADLs, IADLs, rest and sleep, leisure, and social participation.   CO-MORBIDITIES: may have co-morbidities  that affects occupational performance. Patient will benefit from skilled OT to address above impairments and improve overall function.  MODIFICATION OR ASSISTANCE TO COMPLETE EVALUATION: Min-Moderate modification of tasks or assist with assess necessary to complete an evaluation.  OT OCCUPATIONAL PROFILE AND HISTORY: Detailed assessment: Review of records and additional review of physical, cognitive, psychosocial history related to current functional performance.  CLINICAL DECISION MAKING: Moderate - several treatment options, min-mod task modification necessary  REHAB POTENTIAL: Good  EVALUATION COMPLEXITY: Moderate  PLAN:  OT FREQUENCY: 2x/week  OT DURATION: 12 weeks  PLANNED INTERVENTIONS: 97168 OT Re-evaluation, 97535 self care/ADL training, 02889 therapeutic exercise, 97530 therapeutic activity, 97112 neuromuscular re-education, 97140 manual therapy, 97018 paraffin, 02960 fluidotherapy, 97010 moist heat, 97010 cryotherapy, 97034 contrast bath, 97032 electrical stimulation (manual), 97760 Orthotic Initial, passive range of motion, energy conservation, coping strategies training, patient/family education,  and DME and/or AE instructions  RECOMMENDED OTHER SERVICES: PT   CONSULTED AND AGREED WITH PLAN OF CARE: Patient  PLAN FOR NEXT SESSION: Treatment   Inocente Blazing, MS, OTR/L

## 2024-03-30 ENCOUNTER — Encounter: Payer: Self-pay | Admitting: Physical Therapy

## 2024-03-30 ENCOUNTER — Ambulatory Visit: Admitting: Physical Therapy

## 2024-03-30 ENCOUNTER — Encounter: Admitting: Speech Pathology

## 2024-03-30 ENCOUNTER — Ambulatory Visit

## 2024-03-30 DIAGNOSIS — M6281 Muscle weakness (generalized): Secondary | ICD-10-CM

## 2024-03-30 DIAGNOSIS — R269 Unspecified abnormalities of gait and mobility: Secondary | ICD-10-CM

## 2024-03-30 DIAGNOSIS — R278 Other lack of coordination: Secondary | ICD-10-CM

## 2024-03-30 DIAGNOSIS — R296 Repeated falls: Secondary | ICD-10-CM

## 2024-03-30 DIAGNOSIS — R262 Difficulty in walking, not elsewhere classified: Secondary | ICD-10-CM

## 2024-03-30 DIAGNOSIS — R2689 Other abnormalities of gait and mobility: Secondary | ICD-10-CM

## 2024-03-30 NOTE — Therapy (Signed)
 OUTPATIENT PHYSICAL THERAPY TREATMENT  Patient Name: Erika Stanton MRN: [id number] DOB:11-28-1955, 68 y.o., female Today's Date: 03/30/2024  PCP: Dr. Oneil Galloway  REFERRING PROVIDER: Dr. Oneil Galloway  END OF SESSION:   PT End of Session - 03/30/24 1537     Visit Number 28    Number of Visits 45    Date for Recertification  05/27/24    Progress Note Due on Visit 30    PT Start Time 1531    PT Stop Time 1615    PT Time Calculation (min) 44 min    Equipment Utilized During Treatment Gait belt    Activity Tolerance Patient tolerated treatment well    Behavior During Therapy WFL for tasks assessed/performed               Past Medical History:  Diagnosis Date   Anatomical narrow angle, bilateral    Aneurysm    Asthma    Cerebral aneurysm    Cerebral vasospasm    Cognitive change    Dysphagia, post-stroke    GERD (gastroesophageal reflux disease)    Hemorrhoids    History of cervical dysplasia    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 no recurrent polyps.  suggest repeat surveillance study 11/2022.     COLONOSCOPY W/ POLYPECTOMY  06/2014   Dr Elder at [facility].  3 adenomatoous polyps, anal fissure.     ESOPHAGOGASTRODUODENOSCOPY (EGD) WITH PROPOFOL  N/A 01/27/2020   Procedure: ESOPHAGOGASTRODUODENOSCOPY (EGD) WITH PROPOFOL ;  Surgeon: Shila Gustav GAILS, MD;  Location: [facility] ENDOSCOPY;  Service: Endoscopy;  Laterality: N/A;   INSERTION OF PERMANENT INTRAPERITONEAL CANNULA/CATH N/A    LAPAROSCOPIC   IR 3D INDEPENDENT WKST  01/06/2020   IR  ANGIO INTRA EXTRACRAN SEL INTERNAL CAROTID BILAT MOD SED  01/06/2020   IR ANGIO VERTEBRAL SEL VERTEBRAL UNI L MOD SED  01/06/2020   IR ANGIOGRAM FOLLOW UP STUDY  01/06/2020   IR ANGIOGRAM FOLLOW UP STUDY  01/06/2020   IR ANGIOGRAM FOLLOW UP STUDY  01/06/2020   IR ANGIOGRAM FOLLOW UP STUDY  01/06/2020   IR ANGIOGRAM FOLLOW UP STUDY  01/06/2020   IR ANGIOGRAM FOLLOW UP STUDY  01/06/2020   IR ANGIOGRAM FOLLOW UP STUDY  01/06/2020   IR ANGIOGRAM FOLLOW UP STUDY  01/06/2020   IR ANGIOGRAM FOLLOW UP STUDY  01/06/2020   IR ANGIOGRAM FOLLOW UP STUDY  01/06/2020   IR NEURO EACH ADD'L AFTER BASIC UNI RIGHT (MS)  01/06/2020   IR TRANSCATH/EMBOLIZ  01/06/2020   MASTECTOMY     MOHS SURGERY N/A    RADIOLOGY WITH ANESTHESIA N/A 01/06/2020   Procedure: IR WITH ANESTHESIA FOR ANEURYSM;  Surgeon: Lanis Pupa, MD;  Location: [facility] OR;  Service: Radiology;  Laterality: N/A;   STENT SUPPORTED EMBOLIZATION OF RIGHT ICA ANEURYSM Right    TUBAL LIGATION     VENTRICULO-PERITONEAL SHUNT PLACEMENT / LAPAROSCOPIC INSERTION PERITONEAL CATHETER N/A    Patient Active Problem List   Diagnosis Date Noted  Dyspareunia in female 04/05/2020   History of abnormal cervical Pap smear 03/10/2020   GIB (gastrointestinal bleeding) 02/28/2020   Elevated BUN    Prediabetes    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   Cerebral vasospasm    Sinus tachycardia    Dysphagia, post-stroke    Thrombocytopenia    Acute blood loss anemia    Brain aneurysm    ICH (intracerebral hemorrhage) (HCC) 01/05/2020   History of adenomatous polyp of colon 02/07/2016   Anatomical narrow angle, bilateral 12/14/2014   Fissure in ano 11/11/2014   Hemorrhoids 11/11/2014   Constipation 11/19/2013   GERD (gastroesophageal reflux disease) 03/24/2013   Irritable bowel syndrome with diarrhea 03/24/2013   Herpes zoster 05/14/2005   Abnormal Pap smear of cervix 05/15/1983   History of cervical dysplasia 05/15/1983    ONSET DATE:  12/02/2023  REFERRING DIAG: I63.9 (ICD-10-CM) - CVA (cerebral vascular accident) (HCC)   THERAPY DIAG:  Muscle weakness (generalized)  Difficulty in walking, not elsewhere classified  Other lack of coordination  Abnormality of gait and mobility  Other abnormalities of gait and mobility  Repeated falls  Rationale for Evaluation and Treatment: Rehabilitation  SUBJECTIVE:                                                                                                                                                                                             SUBJECTIVE STATEMENT:   Pt reports having a good weekend with her brother going out to lunch for her birthday. Pt reports minor pain in B knees.   PERTINENT HISTORY:  Patient reports she was in hospital from [facility]- Sat (July 21st- 25th) with CVA affecting left side. I was supposed to have a knee replacement surgery in September but that is on hold for now. I was doing my prep for my colonoscopy and fell in the bathroom.  Patient reports having mild left sided weakness compared to her CVA in 2021 with heavy Right sided UE weakness. She reports bad OA affecting both knees as well. Previous CVA in 2021 affecting Right side. Reports going to ED  last Monday due to fall/CVA affecting Left side. Diagnosed with R CVA with Left Sided weakness.    PAIN:  Are you having pain? 4/10 B knees   PRECAUTIONS: Fall  WEIGHT BEARING RESTRICTIONS: No  FALLS: Has patient fallen in last 6 months? Yes. Number of falls fell with the stroke  PATIENT GOALS: Walk without any assistive device.  OBJECTIVE:  Note: Objective measures were completed at evaluation unless otherwise noted.  TREATMENT DATE : 03/30/2024 Nustep seat 8, arms 11, level 3, , R UE support only 591ft total ambulation around hospital 1 minute rest break  taken around 172ft 1 minute rest break taken in front of [employer] Sideways ambulation w/ SPC, 4 laps L/R, 5ft each lap, verbal and tactile cue to maintain neutral pelvic alignment to prevent visuo-spatial compensation to R side 4x5 sit<>stand, one hand support to control descent; verbal and tactile cue for full upright posture upon standing  SPT provided CGA at all times unless otherwise stated.   PATIENT EDUCATION: Education details: PT plan of care; Discussion of symptoms of CVA and to go to ED as soon as possible. Purpose of PT for balance, strength, coordination and endurance.  Person educated: Patient Education method: Explanation Education comprehension: verbalized understanding  HOME EXERCISE PROGRAM: Access Code: [email or web address] URL: [email or web address] Date: 12/17/2023 Prepared by: Sidra Simpers  Program Notes **Be sure to perform all exercises next to a countertop or sturdy piece of furniture in case you become unsteady.  Exercises - Standing Heel Raise with Support  - 1 x daily - 7 x weekly - 2 sets - 15 reps - Standing Toe Raises at Chair  - 1 x daily - 7 x weekly - 2 sets - 15 reps - Mini Squat with Counter Support  - 1 x daily - 7 x weekly - 2 sets - 15 reps - Lunge with Counter Support  - 1 x daily - 7 x weekly - 2 sets - 15 reps - Standing Tandem Balance with Counter Support  - 1 x daily - 7 x weekly - 2 sets - 2 reps - 30 hold - Standing Single Leg Stance with Counter Support  - 1 x daily - 7 x weekly - 2 sets - 2 reps - 30 hold  GOALS: Goals reviewed with patient? Yes  SHORT TERM GOALS: Target date: 01/21/2024  Pt will be independent with HEP in order to improve strength and balance in order to decrease fall risk and improve function at home.  Baseline: EVAL - No current formal HEP in place; 03/04/2024- Patient reports independent with all current HEP and riding a stationary bike.  Goal status: MET   LONG TERM GOALS: Target date: 05/27/2024  1.   Patient will complete five times sit to stand test in < 15 seconds indicating an increased LE strength and improved balance. Baseline: EVAL= 25.20 sec with LUE Support 9/2: 23.36 without UE support and CGA-min assist. 20.10 sec with LUE support; 03/04/2024- more difficulty raising without UE but performed 18.75 sec with LUE Support Goal status: INITIAL  2.  Patient will increase Berg Balance score by > 6 points to demonstrate decreased fall risk during functional activities. Baseline: EVAL: To be assessed next visit; 12/12/2023= 42/56 9/2: 44/56  10/16:45/56 Goal status: PROGRESSING   3.  Patient will reduce timed up and go to <11 seconds to reduce fall risk and demonstrate improved transfer/gait ability. Baseline: EVAL = 40 sec with RW 9/2: 18.33sec with SPC 10/16:17.47 sec with SPC Goal status: PROGRESSING  4.  Patient will increase 10 meter walk test to >1.40m/s as to improve gait speed for better community ambulation and to reduce fall risk. Baseline: EVAL: 0.65 m/s 9/2: 0.67 m/s with [facility] 9/9: 0.71 m/s without an AD and wearing 2.5 # AW 10/16: .75 m/s with SPC Goal status: PROGRESSING  5. Patient will increase six minute walk test distance by 200 feet for progression to community ambulator and improve gait  ability Baseline: EVAL= 482' 9/2: 465 feet  using a SPC with CGA required seated rest break at 4:63min  10/16: 542 ft and full 6 minutes  Goal status: INITIAL   ASSESSMENT:  CLINICAL IMPRESSION:   Pt responded well to today's treatment session and showed improved stamina following decrease of rest breaks and increased distance of ambulation exercises. Started today's session with Nustep for neurologic priming of LE muscles for increased ambulation distance today. Pt required two 1 minute rest breaks during 599ft lap, done in hospital hallways in order to redirect pt from self-limitation of activities. Pt required a few timed rest breaks due to mild fatigue, but it does not seem  to be due to a major increase in fatigue and more so due to self-limitation. Performed sideways ambulation in hallway today for increased space/distance and for decreased reliance of UE support. Pt was able to complete increase in distance of side stepping as well, and required two shortened seated rest breaks. Pt increased sets of sit<>stands to 4x5 in today's session, and showed improved eccentric control of descent compared to last session. Pt will continue to benefit from skilled therapy to address remaining deficits in order to improve overall QoL and return to PLOF.   OBJECTIVE IMPAIRMENTS: Abnormal gait, decreased activity tolerance, decreased balance, decreased coordination, decreased endurance, decreased mobility, difficulty walking, decreased ROM, decreased strength, impaired UE functional use, and pain.   ACTIVITY LIMITATIONS: carrying, lifting, bending, sitting, standing, squatting, sleeping, stairs, and transfers  PARTICIPATION LIMITATIONS: meal prep, cleaning, laundry, driving, shopping, community activity, and yard work  PERSONAL FACTORS: 1-2 comorbidities: OA, previous CVA with significant R sided weakness are also affecting patient's functional outcome.   REHAB POTENTIAL: Good  CLINICAL DECISION MAKING: Stable/uncomplicated  EVALUATION COMPLEXITY: Moderate  PLAN:  PT FREQUENCY: 1-2x/week  PT DURATION: 12 weeks  PLANNED INTERVENTIONS: 97164- PT Re-evaluation, 97750- Physical Performance Testing, 97110-Therapeutic exercises, 97530- Therapeutic activity, [id number]- Neuromuscular re-education, 97535- Self Care, [id number]- Manual therapy, [id number]- Gait training, [phone]- Orthotic Initial, [phone]- Orthotic/Prosthetic subsequent, [phone]- Canalith repositioning, [phone]- Electrical stimulation (manual), [phone] (1-2 muscles), [id number] (3+ muscles)- Dry Needling, Patient/Family education, Balance training, Stair training, Taping, Joint mobilization, Joint manipulation, Spinal manipulation, Spinal  mobilization, Compression bandaging, Vestibular training, DME instructions, Cryotherapy, and Moist heat  PLAN FOR NEXT SESSION:  -continue to work on improvement of ambulation distance tolerance -decrease time of rest breaks -step navigation of curbs -continue BLE functional strengthening activities -continue gait training with LRAD w/ increased activity tolerance through hall of hospital to reduce self limiting behavior   Renna Helling, SPT  03/30/24, 3:39 PM

## 2024-03-31 NOTE — Therapy (Signed)
 Outpatient Occupational Therapy Neuro Progress and Treatment Note Reporting period beginning 02/20/24-03/30/24    Patient Name: Erika Stanton MRN: [id number] DOB:09-Oct-1955, 68 y.o., female Today's Date: 03/31/2024  PCP: Laurice Anes, MD REFERRING PROVIDER: Laurice Anes, MD   OT End of Session - 03/31/24 1419     Visit Number 30    Number of Visits 48    Date for Recertification  05/25/24    Authorization Time Period Reporting period beginning 02/20/24-03/30/24    OT Start Time 1451    OT Stop Time 1530    OT Time Calculation (min) 39 min    Equipment Utilized During Treatment transport chair, SPC    Activity Tolerance Patient tolerated treatment well    Behavior During Therapy WFL for tasks assessed/performed         Past Medical History:  Diagnosis Date   Anatomical narrow angle, bilateral    Aneurysm    Asthma    Cerebral aneurysm    Cerebral vasospasm    Cognitive change    Dysphagia, post-stroke    GERD (gastroesophageal reflux disease)    Hemorrhoids    History of cervical dysplasia    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 no recurrent polyps.  suggest repeat surveillance study 11/2022.     COLONOSCOPY W/ POLYPECTOMY  06/2014   Dr Elder at [facility].  3 adenomatoous polyps, anal fissure.     ESOPHAGOGASTRODUODENOSCOPY (EGD) WITH PROPOFOL  N/A 01/27/2020   Procedure: ESOPHAGOGASTRODUODENOSCOPY (EGD) WITH PROPOFOL ;  Surgeon: Shila Gustav GAILS, MD;  Location: [facility] ENDOSCOPY;  Service: Endoscopy;  Laterality: N/A;   INSERTION OF  PERMANENT INTRAPERITONEAL CANNULA/CATH N/A    LAPAROSCOPIC   IR 3D INDEPENDENT WKST  01/06/2020   IR ANGIO INTRA EXTRACRAN SEL INTERNAL CAROTID BILAT MOD SED  01/06/2020   IR ANGIO VERTEBRAL SEL VERTEBRAL UNI L MOD SED  01/06/2020   IR ANGIOGRAM FOLLOW UP STUDY  01/06/2020   IR ANGIOGRAM FOLLOW UP STUDY  01/06/2020   IR ANGIOGRAM FOLLOW UP STUDY  01/06/2020   IR ANGIOGRAM FOLLOW UP STUDY  01/06/2020   IR ANGIOGRAM FOLLOW UP STUDY  01/06/2020   IR ANGIOGRAM FOLLOW UP STUDY  01/06/2020   IR ANGIOGRAM FOLLOW UP STUDY  01/06/2020   IR ANGIOGRAM FOLLOW UP STUDY  01/06/2020   IR ANGIOGRAM FOLLOW UP STUDY  01/06/2020   IR ANGIOGRAM FOLLOW UP STUDY  01/06/2020   IR NEURO EACH ADD'L AFTER BASIC UNI RIGHT (MS)  01/06/2020   IR TRANSCATH/EMBOLIZ  01/06/2020   MASTECTOMY     MOHS SURGERY N/A    RADIOLOGY WITH ANESTHESIA N/A 01/06/2020   Procedure: IR WITH ANESTHESIA FOR ANEURYSM;  Surgeon: Lanis Pupa, MD;  Location: [facility] OR;  Service: Radiology;  Laterality: N/A;   STENT SUPPORTED EMBOLIZATION OF RIGHT ICA ANEURYSM Right    TUBAL LIGATION     VENTRICULO-PERITONEAL SHUNT PLACEMENT / LAPAROSCOPIC INSERTION PERITONEAL CATHETER N/A    Patient Active Problem List   Diagnosis Date Noted  Dyspareunia in female 04/05/2020   History of abnormal cervical Pap smear 03/10/2020   GIB (gastrointestinal bleeding) 02/28/2020   Elevated BUN    Prediabetes    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   Cerebral vasospasm    Sinus tachycardia    Dysphagia, post-stroke    Thrombocytopenia    Acute blood loss anemia    Brain aneurysm    ICH (intracerebral hemorrhage) (HCC) 01/05/2020   History of adenomatous polyp of colon 02/07/2016   Anatomical narrow angle, bilateral 12/14/2014   Fissure in ano 11/11/2014   Hemorrhoids 11/11/2014   Constipation 11/19/2013   GERD (gastroesophageal reflux disease) 03/24/2013   Irritable bowel syndrome with diarrhea 03/24/2013   Herpes zoster 05/14/2005    Abnormal Pap smear of cervix 05/15/1983   History of cervical dysplasia 05/15/1983   ONSET DATE: 12/02/2023  REFERRING DIAG: L ischemic CVA  THERAPY DIAG:  Muscle weakness (generalized)  Other lack of coordination  Rationale for Evaluation and Treatment: Rehabilitation  SUBJECTIVE:  SUBJECTIVE STATEMENT: Pt reports having a good weekend.  PERTINENT HISTORY:  Pt. was admitted to [facility] from 12/02/23-12/06/23 with an Acute Right MCA CVA, Hydrocephalus with shunt  placement. History of Subarachnoid Hemorrhage with bilateral ICA coiled aneurysms. 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  PRECAUTIONS: None  WEIGHT BEARING RESTRICTIONS: No  PAIN: 03/30/24: No UE pain, 4/10 in knees Are you having pain? No  FALLS: Has patient fallen in last 6 months? Yes. Number of falls 1  LIVING ENVIRONMENT: Lives with: lives with their family and lives with their spouse Lives in: House/apartment Stairs: hand rails, 2 steps   Has following equipment at home: [name], cane  PLOF: Independent with basic ADLs, Spouse help as needed  PATIENT GOALS: Strengthening  OBJECTIVE:  Note: Objective measures were completed at Evaluation unless otherwise noted.  HAND DOMINANCE: Left  ADLs: Overall ADLs: spouse assists when needed, Pt. reports that she can do most tasks herself. Uses her left hand to engage in self-care tasks. Eating: Able to cut foods with a fork, husband assists as needed.  Grooming: husband assists with applying toothpaste, able to brush teeth and hair UB Dressing: Spouse assists with applying/fastening bra, Pt. Requires assistance with zippers, and buttons. LB Dressing: Spouse assists with tying shoes when  needed, uses slip on shoes. Pt. Does not zip pants. Toileting: Able to complete independently Bathing: Pt. Requires intermittent assistance with bathing tasks. Tub Shower transfers: Spouse assists with transfers when/if needed Equipment: Shower seat with back  IADLs: Shopping: Spouse typically does the shopping, isnt able to carry heavy objects.  Light housekeeping: Spouse typically does most of the cleaning around the home, Pt. Cleans bathroom, and laundry Pt. Is unable to carry laundry due to exacerbation of weakness.  Meal Prep: Spouse typically cooks and prepares meals.  Community mobility: Pt. Requires assistance using walker. Pt. reports that she is unable to carry heavy items, and typically does not do the shopping.  Medication management: Pt. is able to take care of meds with pillbox Financial management:  Spouse takes care of it, and makes online payments.  Handwriting: 50% legible  MOBILITY STATUS: Needs Assist: Pt. Requires use of a walker   POSTURE COMMENTS:  No Significant postural limitations Sitting balance: Good  ACTIVITY TOLERANCE: Activity tolerance:  FUNCTIONAL OUTCOME MEASURES: TBD  UPPER EXTREMITY ROM:    Active ROM Right eval Right 01/16/24 Right 03/02/24 Left eval Left 01/16/24 Left 03/02/24  Shoulder flexion 94(121) 100 with scaption (132) 126(146) 102(125) 134 with scaption (145) 142(148)  Shoulder abduction 75(106) 74 (98) 93(120) 110(126) 140 (170) WNL  Shoulder adduction        Shoulder extension        Shoulder internal rotation        Shoulder external rotation        Elbow flexion 135(155)  150(155) 140(150)  150  Elbow extension 0  0 -10(-9)  -4(0)  Wrist flexion 40(44)  48(60) 42(58)  52(60)  Wrist extension 30(34)  48(60) 40(50)  52(56)  Wrist ulnar deviation        Wrist radial deviation        Wrist pronation        Wrist supination        (Blank rows = not tested)  UPPER EXTREMITY MMT:     MMT Right eval Right 03/02/24  Left eval Left 03/02/24  Shoulder flexion 3-/5 3+/5 3-/5 4/5  Shoulder abduction 3-/5 3-/5 3-/5 4/5  Shoulder adduction      Shoulder extension      Shoulder internal rotation      Shoulder external rotation      Middle trapezius      Lower trapezius      Elbow flexion 4-/5 4+/5 4+/5 5/5  Elbow extension 4-/5 4+/5 4+/5 5/5  Wrist flexion 4-/5 4-/5 3+/5 4/5  Wrist extension 4-/5 4-/5 3+/5 4/5  Wrist ulnar deviation      Wrist radial deviation      Wrist pronation      Wrist supination      (Blank rows = not tested)  HAND FUNCTION Eval:  Grip strength: Right: N/T lbs; Left: 43 lbs, Lateral pinch: Right: N/T lbs, Left: 9 lbs, and 3 point pinch: Right: N/T lbs, Left: 5 lbs Pt. Presents with flexor tightness in the right 5th digit, however is able to passively extend 5th digit within normal range. Pt. Also Presents with hyper extension in the 2nd and 3rd digit at PIP joints. Pt. Is able to achieve full Digit MP, PIP, and DIP PROM.  01/16/24: Grip strength: Left: 33 lbs; Lateral pinching: Left: 8 lbs; 3 point pinch: Left: 3 lbs  03/02/24: Grip strength: Left: 36 lbs; Lateral pinching: Left: 8 lbs; 3 point pinch: Left: 3 lbs  COORDINATION: 9 Hole Peg test: Right: N/T sec; Left: 51 sec 01/16/24: Left: 1 min 1 sec 03/02/24:  Left: 1 min 1 sec  SENSATION: Not tested  EDEMA:   MUSCLE TONE: Fluctuating flexor tone in R hand.  COGNITION: Overall cognitive status: Within functional limits for tasks assessed  VISION: Subjective report:  Baseline vision:  Visual history:   VISION ASSESSMENT:  PERCEPTION:   PRAXIS:   OBSERVATIONS: Pt. presents with fluctuating flexor tone in the R hand, involving the 5th digit at the PIP/DIP joint. Pt. presents with hyperextension at the 2nd and 3rd digits at the PIP joints.  TREATMENT DATE: 03/30/24 Therapeutic  Activity: -Facilitated bilat hand coordination, L grip strengthening, and R/LUE forward and lateral reaching patterns, using R hand to reach for jumbo pegs positioned to pt's R on table top, then transferring peg to L hand to place peg into pegboard.  Pt was given intermittent min vc for consistent use of R hand as a stabilizer, and did make a few successful attempts to place a peg into pegboard with R hand.   Self Care: -Review of goals/poc.  PATIENT EDUCATION: Education details: Review of goals/poc Person educated: Patient Education method: Explanation and Verbal cues Education comprehension: verbal cues required  HOME EXERCISE PROGRAM: -Pink theraputty; visual handout issued -Self and caregiver assisted PROM to R hand and wrist -Monitor splint with skin checks  GOALS: Goals reviewed with patient? Yes  SHORT TERM GOALS: Target date: 04/13/2024   Pt. Will be independent with HEP for UE functioning. Baseline: Eval: No current HEP; 01/16/24: pt reports doing a little writing and squeezing a ball, but denies using putty; spouse stretches RUE daily 02/20/24:  Pt. Continues to do a little writing and squeezing a ball, but denies using putty; spouse stretches RUE 03/02/24: Pt. Husband assists Pt. With ROM to the right hand. Pt. reports doing hand exercises; 03/30/24: Spouse indep with HEP (spouse assists daily) Goal status: achieved  LONG TERM GOALS: Target date: 05/25/2024  Pt. Will improve [facility] skills to be able to manipulate small objects at home Baseline: 9 hole peg test: L: 51 seconds; 01/16/24: L 1 min 1 sec, 03/02/24: Left: 1 min. & 1 sec. Pt. Continues to work towards using the left to manipulate small objects Goal status: Ongoing  2.  Pt. Will increase BUE shoulder ROM by 10 degrees to be able to reach into cabinets and shelves.  Baseline: Shoulder flexion: R: 94(121), L: 102(125), Shoulder abduction: R: 75(106), L: 110(126); 01/16/24: Shoulder flexion: R: 100 with scaption (132), L: 134  with scaption (145), Shoulder abduction: R: 74 (98), L: 140 (170)02/20/24: Pt. Is improving with functional reaching through multiple planes with cues, and assist required. 03/02/24: Shoulder flexion: R: 126(146), L: 142(148), Shoulder abduction: R: 93(120), L: WNL; 03/30/24: ROM to be updated next session; limited by time constraints with poc review, though pt does no Goal status: In progress; Ongoing  3.  Pt. Will improve L grip strength by 5# to be able to securely grasp ADL items at home.  Baseline: Grip strength: R: NT, L: 43#; 01/16/24: L: 33#  03/02/24: 36# Goal status: In progress; Ongoing  4.  Pt. Will increase L lateral key pinch strength by 3#  to open wide mouth jars.  Baseline: Lateral key pinch: R: NT, L: 9#; 01/16/24: L: 8# 03/02/24: Pinch meter is out for calibration. Pt. Continues to have difficulty opening jars Goal status: Ongoing  5.  Pt. Will independently engage the right hand as a gross assist to the left hand 100% of the time during ADLs/IADLs. Baseline: Eval: Pt. With limited engagement of the right hand as a gross assist during tasks; 01/16/24: Observed <25% of the time during OT sessions 02/20/24: Pt. Continues to present with limited engagement of the RUE as a gross assist to the left hand approximately 25% of the time with cues. 03/02/24: Pt. Is starting to engage her right hand more as a gross assist to the left  Goal status: In progress; Ongoing  6.  Pt. Will be able to manipulate zippers, and buttons efficiently with modified independence Baseline: Eval:Right: NT Left: 51 sec;  01/16/24: Left: 1 min and 1 sec 02/20/24: Pt. continues to have difficulty manipulating zippers and buttons.03/02/24: Pt. continues to have difficulty manipulating zippers, and buttons, and may benefit from reviewing adaptive devices. Goal status: Ongoing   7. Pt. Will improve handwriting legibility to 100% for one sentence in printed form for written correspondence.  Baseline: 50% legible in printed  form for first name only; 01/16/24: 80-90% legible to print first name 1 of 4 attempts.  3 of 4 attempts 50% or less legible 02/20/24: first name only 100%, 25% for lists of words. 03/02/24:  first name only 100%, 25% for lists of words.  Goal status: In progress; Ongoing  8. Pt. Will independently, and efficiently transfer small 1/2 objects from the left hand to the right hand in midline in preparation from reaching up  to place items at a target with 100% accuracy with the right hand. Baseline: 03/02/24: Pt. Has progressed to transferring 1.5 objects between her hand in midline, and reach up to place them at targets with 60% accuracy; 03/30/24: Pt can transfer jumbo pegs and clothespins (~1/5) from R to L hand with extra time, repeat trials, and occasional dropping. Pt continues to reach toward a target with ~60% accuracy.  Goal Status: ongoing  9. Pt. Will increased BUE strength by 2 mm grades to assist with ADLs, and IADLs.  Baseline: 03/02/24: UE strength: Shoulder flexion: R: 3+/5 L: 4/5, abduction: R: 3-/5, L 4/5:  elbow flexion: R:4+/5, L: 5/5, extension: R: 4+/5, L:5/5, wrist flexion: R: 4-/5  L: 4/5 wrist extension: R: 4-/5 L: 4/5  Goal Status: New: 03/02/24  ASSESSMENT: CLINICAL IMPRESSION: Objective measures to be completed next session d/t time constraints with review of poc and allowing pt opportunity for question and answer.  Pt is demonstrating increased engagement of the R hand to transfer small items (`1.5) between R and L hands.  Pt still reaches toward a target with about 60% accuracy using RUE.  L grip strength is improving, as noted by pt tolerating increased resistance on hand gripper to remove 17 jumbo pegs from pegboard at 11.2# of resistance (previously tolerated 6.6#), with this activity requiring sustained grasp of pegs to prevent dropping them between pegboard and container on table top.  Pt. continues to benefit from OT services to increase BUE ROM in the shoulder, wrist  and hands, as well as increasing L grip/pinch strength and improve [facility] skills to be able to perform her desired ADL/IADLs.    PERFORMANCE DEFICITS: in functional skills including ADLs, IADLs, coordination, dexterity, edema, tone, ROM, strength, pain, Fine motor control, Gross motor control, endurance, and UE functional use, and psychosocial skills including coping strategies, environmental adaptation, habits, and routines and behaviors.   IMPAIRMENTS: are limiting patient from ADLs, IADLs, rest and sleep, leisure, and social participation.   CO-MORBIDITIES: may have co-morbidities  that affects occupational performance. Patient will benefit from skilled OT to address above impairments and improve overall function.  MODIFICATION OR ASSISTANCE TO COMPLETE EVALUATION: Min-Moderate modification of tasks or assist with assess necessary to complete an evaluation.  OT OCCUPATIONAL PROFILE AND HISTORY: Detailed assessment: Review of records and additional review of physical, cognitive, psychosocial history related to current functional performance.  CLINICAL DECISION MAKING: Moderate - several treatment options, min-mod task modification necessary  REHAB POTENTIAL: Good  EVALUATION COMPLEXITY: Moderate  PLAN:  OT FREQUENCY: 2x/week  OT DURATION: 12 weeks  PLANNED INTERVENTIONS: 97168 OT Re-evaluation, 97535 self care/ADL training, 02889 therapeutic exercise, 97530 therapeutic activity, 97112 neuromuscular re-education,  97140 manual therapy, 97018 paraffin, 02960 fluidotherapy, 97010 moist heat, 97010 cryotherapy, 97034 contrast bath, 97032 electrical stimulation (manual), 97760 Orthotic Initial, passive range of motion, energy conservation, coping strategies training, patient/family education, and DME and/or AE instructions  RECOMMENDED OTHER SERVICES: PT   CONSULTED AND AGREED WITH PLAN OF CARE: Patient  PLAN FOR NEXT SESSION: Treatment   Inocente Blazing, MS, OTR/L

## 2024-04-01 ENCOUNTER — Encounter: Payer: Self-pay | Admitting: Physical Therapy

## 2024-04-01 ENCOUNTER — Encounter: Admitting: Speech Pathology

## 2024-04-01 ENCOUNTER — Ambulatory Visit

## 2024-04-01 ENCOUNTER — Ambulatory Visit: Admitting: Physical Therapy

## 2024-04-01 DIAGNOSIS — R2689 Other abnormalities of gait and mobility: Secondary | ICD-10-CM

## 2024-04-01 DIAGNOSIS — R278 Other lack of coordination: Secondary | ICD-10-CM

## 2024-04-01 DIAGNOSIS — R262 Difficulty in walking, not elsewhere classified: Secondary | ICD-10-CM

## 2024-04-01 DIAGNOSIS — R296 Repeated falls: Secondary | ICD-10-CM

## 2024-04-01 DIAGNOSIS — R269 Unspecified abnormalities of gait and mobility: Secondary | ICD-10-CM

## 2024-04-01 DIAGNOSIS — M6281 Muscle weakness (generalized): Secondary | ICD-10-CM | POA: Diagnosis not present

## 2024-04-01 NOTE — Therapy (Signed)
 Outpatient Occupational Therapy Neuro Treatment Note  Patient Name: Erika Stanton MRN: [id number] DOB:Dec 14, 1955, 68 y.o., female Today's Date: 04/05/2024  PCP: Laurice Anes, MD REFERRING PROVIDER: Laurice Anes, MD   OT End of Session - 04/05/24 1914     Visit Number 31    Number of Visits 48    Date for Recertification  05/25/24    Authorization Time Period Reporting period beginning 03/30/24    OT Start Time 1403    OT Stop Time 1445    OT Time Calculation (min) 42 min    Equipment Utilized During Treatment transport chair, SPC    Activity Tolerance Patient tolerated treatment well    Behavior During Therapy WFL for tasks assessed/performed         Past Medical History:  Diagnosis Date   Anatomical narrow angle, bilateral    Aneurysm    Asthma    Cerebral aneurysm    Cerebral vasospasm    Cognitive change    Dysphagia, post-stroke    GERD (gastroesophageal reflux disease)    Hemorrhoids    History of cervical dysplasia    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 no recurrent polyps.  suggest repeat surveillance study 11/2022.     COLONOSCOPY W/ POLYPECTOMY  06/2014   Dr Elder at [facility].  3 adenomatoous polyps, anal fissure.     ESOPHAGOGASTRODUODENOSCOPY (EGD) WITH PROPOFOL  N/A 01/27/2020   Procedure: ESOPHAGOGASTRODUODENOSCOPY (EGD) WITH PROPOFOL ;  Surgeon: Shila Gustav GAILS, MD;  Location: [facility];  Service: Endoscopy;  Laterality: N/A;   INSERTION OF PERMANENT INTRAPERITONEAL CANNULA/CATH N/A    LAPAROSCOPIC   IR 3D  INDEPENDENT WKST  01/06/2020   IR ANGIO INTRA EXTRACRAN SEL INTERNAL CAROTID BILAT MOD SED  01/06/2020   IR ANGIO VERTEBRAL SEL VERTEBRAL UNI L MOD SED  01/06/2020   IR ANGIOGRAM FOLLOW UP STUDY  01/06/2020   IR ANGIOGRAM FOLLOW UP STUDY  01/06/2020   IR ANGIOGRAM FOLLOW UP STUDY  01/06/2020   IR ANGIOGRAM FOLLOW UP STUDY  01/06/2020   IR ANGIOGRAM FOLLOW UP STUDY  01/06/2020   IR ANGIOGRAM FOLLOW UP STUDY  01/06/2020   IR ANGIOGRAM FOLLOW UP STUDY  01/06/2020   IR ANGIOGRAM FOLLOW UP STUDY  01/06/2020   IR ANGIOGRAM FOLLOW UP STUDY  01/06/2020   IR ANGIOGRAM FOLLOW UP STUDY  01/06/2020   IR NEURO EACH ADD'L AFTER BASIC UNI RIGHT (MS)  01/06/2020   IR TRANSCATH/EMBOLIZ  01/06/2020   MASTECTOMY     MOHS SURGERY N/A    RADIOLOGY WITH ANESTHESIA N/A 01/06/2020   Procedure: IR WITH ANESTHESIA FOR ANEURYSM;  Surgeon: Lanis Pupa, MD;  Location: [facility] OR;  Service: Radiology;  Laterality: N/A;   STENT SUPPORTED EMBOLIZATION OF RIGHT ICA ANEURYSM Right    TUBAL LIGATION     VENTRICULO-PERITONEAL SHUNT PLACEMENT / LAPAROSCOPIC INSERTION PERITONEAL CATHETER N/A    Patient Active Problem List   Diagnosis Date Noted   Dyspareunia in female 04/05/2020  History of abnormal cervical Pap smear 03/10/2020   GIB (gastrointestinal bleeding) 02/28/2020   Elevated BUN    Prediabetes    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   Cerebral vasospasm    Sinus tachycardia    Dysphagia, post-stroke    Thrombocytopenia    Acute blood loss anemia    Brain aneurysm    ICH (intracerebral hemorrhage) (HCC) 01/05/2020   History of adenomatous polyp of colon 02/07/2016   Anatomical narrow angle, bilateral 12/14/2014   Fissure in ano 11/11/2014   Hemorrhoids 11/11/2014   Constipation 11/19/2013   GERD (gastroesophageal reflux disease) 03/24/2013   Irritable bowel syndrome with diarrhea 03/24/2013   Herpes zoster 05/14/2005   Abnormal Pap smear of cervix 05/15/1983   History of cervical dysplasia  05/15/1983   ONSET DATE: 12/02/2023  REFERRING DIAG: L ischemic CVA  THERAPY DIAG:  Muscle weakness (generalized)  Other lack of coordination  Rationale for Evaluation and Treatment: Rehabilitation  SUBJECTIVE:  SUBJECTIVE STATEMENT: Pt doing ok today.  PERTINENT HISTORY:  Pt. was admitted to [facility] from 12/02/23-12/06/23 with an Acute Right MCA CVA, Hydrocephalus with shunt  placement. History of Subarachnoid Hemorrhage with bilateral ICA coiled aneurysms. 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  PRECAUTIONS: None  WEIGHT BEARING RESTRICTIONS: No  PAIN: 04/01/24: No UE pain, 3/10 in knees Are you having pain? No  FALLS: Has patient fallen in last 6 months? Yes. Number of falls 1  LIVING ENVIRONMENT: Lives with: lives with their family and lives with their spouse Lives in: House/apartment Stairs: hand rails, 2 steps   Has following equipment at home: [name], cane  PLOF: Independent with basic ADLs, Spouse help as needed  PATIENT GOALS: Strengthening  OBJECTIVE:  Note: Objective measures were completed at Evaluation unless otherwise noted.  HAND DOMINANCE: Left  ADLs: Overall ADLs: spouse assists when needed, Pt. reports that she can do most tasks herself. Uses her left hand to engage in self-care tasks. Eating: Able to cut foods with a fork, husband assists as needed.  Grooming: husband assists with applying toothpaste, able to brush teeth and hair UB Dressing: Spouse assists with applying/fastening bra, Pt. Requires assistance with zippers, and buttons. LB Dressing: Spouse assists with tying shoes when needed, uses slip on shoes. Pt. Does not zip pants. Toileting: Able to complete  independently Bathing: Pt. Requires intermittent assistance with bathing tasks. Tub Shower transfers: Spouse assists with transfers when/if needed Equipment: Shower seat with back  IADLs: Shopping: Spouse typically does the shopping, isnt able to carry heavy objects.  Light housekeeping: Spouse typically does most of the cleaning around the home, Pt. Cleans bathroom, and laundry Pt. Is unable to carry laundry due to exacerbation of weakness.  Meal Prep: Spouse typically cooks and prepares meals.  Community mobility: Pt. Requires assistance using walker. Pt. reports that she is unable to carry heavy items, and typically does not do the shopping.  Medication management: Pt. is able to take care of meds with pillbox Financial management:  Spouse takes care of it, and makes online payments.  Handwriting: 50% legible  MOBILITY STATUS: Needs Assist: Pt. Requires use of a walker   POSTURE COMMENTS:  No Significant postural limitations Sitting balance: Good  ACTIVITY TOLERANCE: Activity tolerance:   FUNCTIONAL OUTCOME MEASURES: TBD  UPPER  EXTREMITY ROM:    Active ROM Right eval Right 01/16/24 Right 03/02/24 Right 04/01/24  Left eval Left 01/16/24 Left 03/02/24 Left 04/01/24  Shoulder flexion 94(121) 100 with scaption (132) 126(146) 100 some scaption (145) 102(125) 134 with scaption (145) 142(148) 140 (155)  Shoulder abduction 75(106) 74 (98) 93(120) 58(90) 110(126) 140 (170) WNL 165 (180)  Shoulder adduction          Shoulder extension          Shoulder internal rotation          Shoulder external rotation          Elbow flexion 135(155)  150(155)  140(150)  150   Elbow extension 0  0  -10(-9)  -4(0)   Wrist flexion 40(44)  48(60)  42(58)  52(60)   Wrist extension 30(34)  48(60)  40(50)  52(56)   Wrist ulnar deviation          Wrist radial deviation          Wrist pronation          Wrist supination          (Blank rows = not tested)  UPPER EXTREMITY MMT:     MMT  Right eval Right 03/02/24 Right 04/01/24 Left eval Left 03/02/24 Left 04/01/24  Shoulder flexion 3-/5 3+/5 3- 3-/5 4/5 4/5  Shoulder abduction 3-/5 3-/5 3- 3-/5 4/5 4/5  Shoulder adduction        Shoulder extension        Shoulder internal rotation        Shoulder external rotation        Middle trapezius        Lower trapezius        Elbow flexion 4-/5 4+/5 4+ 4+/5 5/5 5/5  Elbow extension 4-/5 4+/5 4/5 4+/5 5/5 5/5  Wrist flexion 4-/5 4-/5 4/5 3+/5 4/5 4+/5  Wrist extension 4-/5 4-/5 4- 3+/5 4/5 4+/5  Wrist ulnar deviation        Wrist radial deviation        Wrist pronation        Wrist supination        (Blank rows = not tested)  HAND FUNCTION Eval:  Grip strength: Right: N/T lbs; Left: 43 lbs, Lateral pinch: Right: N/T lbs, Left: 9 lbs, and 3 point pinch: Right: N/T lbs, Left: 5 lbs Pt. Presents with flexor tightness in the right 5th digit, however is able to passively extend 5th digit within normal range. Pt. Also Presents with hyper extension in the 2nd and 3rd digit at PIP joints. Pt. Is able to achieve full Digit MP, PIP, and DIP PROM.  01/16/24: Grip strength: Left: 33 lbs; Lateral pinching: Left: 8 lbs; 3 point pinch: Left: 3 lbs  03/02/24: Grip strength: Left: 36 lbs; Lateral pinching: Left: 8 lbs; 3 point pinch: Left: 3 lbs  04/01/24: Grip strength: Left: (3 trials) 32 lbs, 26 lbs, 34 lbs; Lateral pinch: Pinch gauge unavailable (out for re-calibration)   COORDINATION: 9 Hole Peg test: Right: N/T sec; Left: 51 sec 01/16/24: Left: 1 min 1 sec 03/02/24:  Left: 1 min 1 sec 04/01/24: Left: 58 sec  SENSATION: Not tested  EDEMA:   MUSCLE TONE: Fluctuating flexor tone in R hand.  COGNITION: Overall cognitive status: Within functional limits for tasks assessed  VISION: Subjective report:  Baseline vision:  Visual history:   VISION ASSESSMENT:  PERCEPTION:   PRAXIS:   OBSERVATIONS: Pt. presents with fluctuating flexor tone in the  R hand, involving the  5th digit at the PIP/DIP joint. Pt. presents with hyperextension at the 2nd and 3rd digits at the PIP joints.                                                                                                                    TREATMENT DATE: 04/01/24 Therapeutic Activity: -Objective measures updated  Self Care: -Review of progress towards goals/poc -Review of HEP; recommendation to focus on L hand strengthening and coordination exercises with theraputty on most days of the week  -Reinforced benefits of including R shoulder PROM into daily HEP, not just wrist/hand, in order to prevent R shoulder pain/stiffness -Reinforced importance of pt carryover of HEP at home for goal achievement  PATIENT EDUCATION: Education details: Review of goals/poc/HEP Person educated: Patient Education method: Explanation and Verbal cues Education comprehension: verbal cues required  HOME EXERCISE PROGRAM: -Pink theraputty; visual handout issued -Self and caregiver assisted PROM to R hand and wrist -Monitor splint with skin checks  GOALS: Goals reviewed with patient? Yes  SHORT TERM GOALS: Target date: 04/13/2024   Pt. Will be independent with HEP for UE functioning. Baseline: Eval: No current HEP; 01/16/24: pt reports doing a little writing and squeezing a ball, but denies using putty; spouse stretches RUE daily 02/20/24:  Pt. Continues to do a little writing and squeezing a ball, but denies using putty; spouse stretches RUE 03/02/24: Pt. Husband assists Pt. With ROM to the right hand. Pt. reports doing hand exercises; 03/30/24: Spouse indep with HEP (spouse assists daily) Goal status: achieved  LONG TERM GOALS: Target date: 05/25/2024  Pt. Will improve [facility] skills to be able to manipulate small objects at home Baseline: 9 hole peg test: L: 51 seconds; 01/16/24: L 1 min 1 sec, 03/02/24: Left: 1 min. & 1 sec. Pt. Continues to work towards using the left to manipulate small objects; 04/01/24: L 58 sec (multiple  trials to improve time) Goal status: Ongoing  2.  Pt. Will increase BUE shoulder ROM by 10 degrees to be able to reach into cabinets and shelves.  Baseline: Shoulder flexion: R: 94(121), L: 102(125), Shoulder abduction: R: 75(106), L: 110(126); 01/16/24: Shoulder flexion: R: 100 with scaption (132), L: 134 with scaption (145), Shoulder abduction: R: 74 (98), L: 140 (170)02/20/24: Pt. Is improving with functional reaching through multiple planes with cues, and assist required. 03/02/24: Shoulder flexion: R: 126(146), L: 142(148), Shoulder abduction: R: 93(120), L: WNL; 03/30/24: ROM to be updated next session; limited by time constraints with poc review, though pt reports she does not reach into cabinets; 04/01/24: Shoulder flexion: R: 100 (145), L: 140 (155); Shoulder abd: R: 50 (90), L: 165 (180); pt reports she does not reach into cabinets Goal status: Ongoing  3.  Pt. Will improve L grip strength by 5# to be able to securely grasp ADL items at home.  Baseline: Grip strength: R: NT, L: 43#; 01/16/24: L: 33#  03/02/24: 36#; 04/01/24: L: 34 lbs (best of 3 trials) Goal status: Ongoing  4.  Pt. Will increase L lateral key pinch strength by 3#  to open wide mouth jars.  Baseline: Lateral key pinch: R: NT, L: 9#; 01/16/24: L: 8# 03/02/24: Pinch meter is out for calibration. Pt. Continues to have difficulty opening jars; 04/01/24: NT (pinch gauge not available); continued difficulty opening jars Goal status: Ongoing  5.  Pt. Will independently engage the right hand as a gross assist to the left hand 100% of the time during ADLs/IADLs. Baseline: Eval: Pt. With limited engagement of the right hand as a gross assist during tasks; 01/16/24: Observed <25% of the time during OT sessions 02/20/24: Pt. Continues to present with limited engagement of the RUE as a gross assist to the left hand approximately 25% of the time with cues. 03/02/24: Pt. Is starting to engage her right hand more as a gross assist to the left;  04/01/24: Pt engages the R hand as a fair assist to the L with intermittent vc from OT during tx sessions; pt admits she needs to use the R hand more at home. Goal status: Ongoing  6.  Pt. Will be able to manipulate zippers, and buttons efficiently with modified independence Baseline: Eval:Right: NT Left: 51 sec; 01/16/24: Left: 1 min and 1 sec 02/20/24: Pt. continues to have difficulty manipulating zippers and buttons.03/02/24: Pt. continues to have difficulty manipulating zippers, and buttons, and may benefit from reviewing adaptive devices; 04/01/24: Pt reports that she continues to avoid clothes with fasteners Goal status: Ongoing   7. Pt. Will improve handwriting legibility to 100% for one sentence in printed form for written correspondence.  Baseline: 50% legible in printed form for first name only; 01/16/24: 80-90% legible to print first name 1 of 4 attempts.  3 of 4 attempts 50% or less legible 02/20/24: first name only 100%, 25% for lists of words. 03/02/24:  first name only 100%, 25% for lists of words; 04/01/24: 100% legibility for a short 3 word sentence using L hand.  Goal status: achieved  8. Pt. Will independently, and efficiently transfer small 1/2 objects from the left hand to the right hand in midline in preparation from reaching up  to place items at a target with 100% accuracy with the right hand. Baseline: 03/02/24: Pt. Has progressed to transferring 1.5 objects between her hand in midline, and reach up to place them at targets with 60% accuracy; 03/30/24: Pt can transfer jumbo pegs and clothespins (~1/5) from R to L hand with extra time, repeat trials, and occasional dropping. Pt continues to reach toward a target with ~60% accuracy.  Goal Status: ongoing  9. Pt. Will increased BUE strength by 2 mm grades to assist with ADLs, and IADLs.  Baseline: 03/02/24: UE strength: Shoulder flexion: R: 3+/5 L: 4/5, abduction: R: 3-/5, L 4/5:  elbow flexion: R:4+/5, L: 5/5, extension: R:  4+/5, L:5/5, wrist flexion: R: 4-/5  L: 4/5 wrist extension: R: 4-/5 L: 4/5; 04/01/24: see chart; no significant changes  Goal Status: New: 03/02/24  ASSESSMENT: CLINICAL IMPRESSION: Objective measures taken this date to continue progress update from last session.  Pt has made some gains with L shoulder mobility and engaging the R hand as an assist to the L during functional tasks.  L hand grip strength and 9 hole peg test scores show decline from eval.  L wrist strength shows improvement.  In comparing R/L ROM, strength, and coordination measures, most have been inconsistent, noting increases and decreases between eval and present.  Pt acknowledges little participation in HEP  at home.  OT reinforced importance of increasing activity and carryover of exercises and activities learned in tx in order to work towards established goals.  Pt does report feeling like she is using her L hand better with functional tasks in the home, despite objective measures showing decline.  Poc reviewed with pt, specifically plans to possibly d/c at next progress update if measures/goals are showing plateau.  Pt. continues to benefit from OT services to increase BUE ROM in the shoulder, wrist and hands, as well as increasing L grip/pinch strength and improve [facility] skills to be able to perform her desired ADL/IADLs.    PERFORMANCE DEFICITS: in functional skills including ADLs, IADLs, coordination, dexterity, edema, tone, ROM, strength, pain, Fine motor control, Gross motor control, endurance, and UE functional use, and psychosocial skills including coping strategies, environmental adaptation, habits, and routines and behaviors.   IMPAIRMENTS: are limiting patient from ADLs, IADLs, rest and sleep, leisure, and social participation.   CO-MORBIDITIES: may have co-morbidities  that affects occupational performance. Patient will benefit from skilled OT to address above impairments and improve overall function.  MODIFICATION OR  ASSISTANCE TO COMPLETE EVALUATION: Min-Moderate modification of tasks or assist with assess necessary to complete an evaluation.  OT OCCUPATIONAL PROFILE AND HISTORY: Detailed assessment: Review of records and additional review of physical, cognitive, psychosocial history related to current functional performance.  CLINICAL DECISION MAKING: Moderate - several treatment options, min-mod task modification necessary  REHAB POTENTIAL: Good  EVALUATION COMPLEXITY: Moderate  PLAN:  OT FREQUENCY: 2x/week  OT DURATION: 12 weeks  PLANNED INTERVENTIONS: 97168 OT Re-evaluation, 97535 self care/ADL training, 02889 therapeutic exercise, 97530 therapeutic activity, 97112 neuromuscular re-education, 97140 manual therapy, 97018 paraffin, 02960 fluidotherapy, 97010 moist heat, 97010 cryotherapy, 97034 contrast bath, 97032 electrical stimulation (manual), 97760 Orthotic Initial, passive range of motion, energy conservation, coping strategies training, patient/family education, and DME and/or AE instructions  RECOMMENDED OTHER SERVICES: PT   CONSULTED AND AGREED WITH PLAN OF CARE: Patient  PLAN FOR NEXT SESSION: Treatment  Inocente Blazing, MS, OTR/L

## 2024-04-01 NOTE — Therapy (Signed)
 OUTPATIENT PHYSICAL THERAPY TREATMENT  Patient Name: Erika Stanton MRN: [id number] DOB:January 27, 1956, 68 y.o., female Today's Date: 04/01/2024  PCP: Dr. Oneil Galloway  REFERRING PROVIDER: Dr. Oneil Galloway  END OF SESSION:   PT End of Session - 04/01/24 1449     Visit Number 29    Number of Visits 45    Date for Recertification  05/27/24    Progress Note Due on Visit 30    PT Start Time 1449    PT Stop Time 1530    PT Time Calculation (min) 41 min    Equipment Utilized During Treatment Gait belt    Activity Tolerance Patient tolerated treatment well    Behavior During Therapy WFL for tasks assessed/performed               Past Medical History:  Diagnosis Date   Anatomical narrow angle, bilateral    Aneurysm    Asthma    Cerebral aneurysm    Cerebral vasospasm    Cognitive change    Dysphagia, post-stroke    GERD (gastroesophageal reflux disease)    Hemorrhoids    History of cervical dysplasia    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 no recurrent polyps.  suggest repeat surveillance study 11/2022.     COLONOSCOPY W/ POLYPECTOMY  06/2014   Dr Elder at [facility].  3 adenomatoous polyps, anal fissure.     ESOPHAGOGASTRODUODENOSCOPY (EGD) WITH PROPOFOL  N/A 01/27/2020   Procedure: ESOPHAGOGASTRODUODENOSCOPY (EGD) WITH PROPOFOL ;  Surgeon: Shila Gustav GAILS, MD;  Location: [facility] END[facility];  Service: Endoscopy;  Laterality: N/A;   INSERTION OF PERMANENT INTRAPERITONEAL CANNULA/CATH N/A    LAPAROSCOPIC   IR 3D INDEPENDENT WKST  01/06/2020   IR  ANGIO INTRA EXTRACRAN SEL INTERNAL CAROTID BILAT MOD SED  01/06/2020   IR ANGIO VERTEBRAL SEL VERTEBRAL UNI L MOD SED  01/06/2020   IR ANGIOGRAM FOLLOW UP STUDY  01/06/2020   IR ANGIOGRAM FOLLOW UP STUDY  01/06/2020   IR ANGIOGRAM FOLLOW UP STUDY  01/06/2020   IR ANGIOGRAM FOLLOW UP STUDY  01/06/2020   IR ANGIOGRAM FOLLOW UP STUDY  01/06/2020   IR ANGIOGRAM FOLLOW UP STUDY  01/06/2020   IR ANGIOGRAM FOLLOW UP STUDY  01/06/2020   IR ANGIOGRAM FOLLOW UP STUDY  01/06/2020   IR ANGIOGRAM FOLLOW UP STUDY  01/06/2020   IR ANGIOGRAM FOLLOW UP STUDY  01/06/2020   IR NEURO EACH ADD'L AFTER BASIC UNI RIGHT (MS)  01/06/2020   IR TRANSCATH/EMBOLIZ  01/06/2020   MASTECTOMY     MOHS SURGERY N/A    RADIOLOGY WITH ANESTHESIA N/A 01/06/2020   Procedure: IR WITH ANESTHESIA FOR ANEURYSM;  Surgeon: Lanis Pupa, MD;  Location: [facility] OR;  Service: Radiology;  Laterality: N/A;   STENT SUPPORTED EMBOLIZATION OF RIGHT ICA ANEURYSM Right    TUBAL LIGATION     VENTRICULO-PERITONEAL SHUNT PLACEMENT / LAPAROSCOPIC INSERTION PERITONEAL CATHETER N/A    Patient Active Problem List   Diagnosis Date Noted  Dyspareunia in female 04/05/2020   History of abnormal cervical Pap smear 03/10/2020   GIB (gastrointestinal bleeding) 02/28/2020   Elevated BUN    Prediabetes    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   Cerebral vasospasm    Sinus tachycardia    Dysphagia, post-stroke    Thrombocytopenia    Acute blood loss anemia    Brain aneurysm    ICH (intracerebral hemorrhage) (HCC) 01/05/2020   History of adenomatous polyp of colon 02/07/2016   Anatomical narrow angle, bilateral 12/14/2014   Fissure in ano 11/11/2014   Hemorrhoids 11/11/2014   Constipation 11/19/2013   GERD (gastroesophageal reflux disease) 03/24/2013   Irritable bowel syndrome with diarrhea 03/24/2013   Herpes zoster 05/14/2005   Abnormal Pap smear of cervix 05/15/1983   History of cervical dysplasia 05/15/1983    ONSET DATE:  12/02/2023  REFERRING DIAG: I63.9 (ICD-10-CM) - CVA (cerebral vascular accident) (HCC)   THERAPY DIAG:  Muscle weakness (generalized)  Other lack of coordination  Difficulty in walking, not elsewhere classified  Abnormality of gait and mobility  Other abnormalities of gait and mobility  Repeated falls  Rationale for Evaluation and Treatment: Rehabilitation  SUBJECTIVE:                                                                                                                                                                                             SUBJECTIVE STATEMENT:   Pt reports having a good OT session. She reports minor knee pain, slightly worse than the usual, only a 4/10 in the R knee.   PERTINENT HISTORY:  Patient reports she was in hospital from [facility]- Sat (July 21st- 25th) with CVA affecting left side. I was supposed to have a knee replacement surgery in September but that is on hold for now. I was doing my prep for my colonoscopy and fell in the bathroom.  Patient reports having mild left sided weakness compared to her CVA in 2021 with heavy Right sided UE weakness. She reports bad OA affecting both knees as well. Previous CVA in 2021 affecting Right side. Reports going to ED  last Monday due to fall/CVA affecting Left side. Diagnosed with R CVA with Left Sided weakness.    PAIN:  Are you having pain? 4/10 R knee   PRECAUTIONS: Fall  WEIGHT BEARING RESTRICTIONS: No  FALLS: Has patient fallen in last 6 months? Yes. Number of falls fell with the stroke  PATIENT GOALS: Walk without any assistive device.  OBJECTIVE:  Note: Objective measures were completed at evaluation unless otherwise noted.  TREATMENT DATE : 04/01/2024  Nustep seat 9, arms 9, level 3-6 rolling hill program, 6 min, R UE support only on handle, avg 50-60 SPM  44ft  total ambulation around hospital halls, three short rest breaks <1 min taken during walk 4x5 sit<>stands, using only L hand support to control descent, pt showed full stand today without tactile or verbal cues, only 30s rest break between sets Step ups onto first step of staircase w 2# AW on R LE, 3x8  SPT provided CGA at all times unless otherwise stated. Pt still required/requested occasional rest breaks throughout session.   PATIENT EDUCATION: Education details: PT plan of care; Discussion of symptoms of CVA and to go to ED as soon as possible. Purpose of PT for balance, strength, coordination and endurance.  Person educated: Patient Education method: Explanation Education comprehension: verbalized understanding  HOME EXERCISE PROGRAM: Access Code: [email or web address] URL: [email or web address] Date: 12/17/2023 Prepared by: Sidra Simpers  Program Notes **Be sure to perform all exercises next to a countertop or sturdy piece of furniture in case you become unsteady.  Exercises - Standing Heel Raise with Support  - 1 x daily - 7 x weekly - 2 sets - 15 reps - Standing Toe Raises at Chair  - 1 x daily - 7 x weekly - 2 sets - 15 reps - Mini Squat with Counter Support  - 1 x daily - 7 x weekly - 2 sets - 15 reps - Lunge with Counter Support  - 1 x daily - 7 x weekly - 2 sets - 15 reps - Standing Tandem Balance with Counter Support  - 1 x daily - 7 x weekly - 2 sets - 2 reps - 30 hold - Standing Single Leg Stance with Counter Support  - 1 x daily - 7 x weekly - 2 sets - 2 reps - 30 hold  GOALS: Goals reviewed with patient? Yes  SHORT TERM GOALS: Target date: 01/21/2024  Pt will be independent with HEP in order to improve strength and balance in order to decrease fall risk and improve function at home.  Baseline: EVAL - No current formal HEP in place; 03/04/2024- Patient reports independent with all current HEP and riding a stationary bike.  Goal status: MET   LONG TERM GOALS:  Target date: 05/27/2024  1.  Patient will complete five times sit to stand test in < 15 seconds indicating an increased LE strength and improved balance. Baseline: EVAL= 25.20 sec with LUE Support 9/2: 23.36 without UE support and CGA-min assist. 20.10 sec with LUE support; 03/04/2024- more difficulty raising without UE but performed 18.75 sec with LUE Support Goal status: INITIAL  2.  Patient will increase Berg Balance score by > 6 points to demonstrate decreased fall risk during functional activities. Baseline: EVAL: To be assessed next visit; 12/12/2023= 42/56 9/2: 44/56  10/16:45/56 Goal status: PROGRESSING   3.  Patient will reduce timed up and go to <11 seconds to reduce fall risk and demonstrate improved transfer/gait ability. Baseline: EVAL = 40 sec with RW 9/2: 18.33sec with SPC 10/16:17.47 sec with SPC Goal status: PROGRESSING  4.  Patient will increase 10 meter walk test to >1.70m/s as to improve gait speed for better community ambulation and to reduce fall risk. Baseline: EVAL: 0.65 m/s 9/2: 0.67 m/s with [facility] 9/9: 0.71 m/s without an AD and wearing 2.5 # AW 10/16: .75 m/s with SPC Goal status: PROGRESSING  5. Patient will increase six minute walk test distance by 200 feet for  progression to community ambulator and improve gait ability Baseline: EVAL= 482' 9/2: 465 feet  using a SPC with CGA required seated rest break at 4:24min  10/16: 542 ft and full 6 minutes  Goal status: INITIAL   ASSESSMENT:  CLINICAL IMPRESSION:  Began today's session with rolling hill Nustep program at level 3-8, and eventually decreased to level 3-6 after due to pt reported difficulty and knee pain with initial higher interval. Pt was able to complete the rest of the with no complaint of pain but report of moderate difficulty with increased resistance compared to usual level 3, which was done for neurologic priming of LE muscles and increased resistance for increased challenge to  musculature. Pt was able to ambulate 472 ft in hospital hallways, and was able to take only standing rest breaks instead of seated rest breaks of less then 45s duration, showing increased stamina and decrease in self-limiting behavior when conversing during ambulation.  Pt was able to perform 4x5 sit<>stands in today's session and showed improvement in form of movement as seen by not needed verbal cuing for upright posture for full motion of ascent. Pt reported mild difficulty in step ups onto step with 2# AW, but was able to complete 8 repetitions. Pt was able to complete 2 more sets of 8 after taking off 2# AW. Pt required verbal cue for upright posture during all sets of step-ups. Pt reported nervousness and appeared unable to complete step up onto step leading with L LE. Pt will continue to benefit from skilled therapy to address remaining deficits in order to improve overall QoL and return to PLOF.   OBJECTIVE IMPAIRMENTS: Abnormal gait, decreased activity tolerance, decreased balance, decreased coordination, decreased endurance, decreased mobility, difficulty walking, decreased ROM, decreased strength, impaired UE functional use, and pain.   ACTIVITY LIMITATIONS: carrying, lifting, bending, sitting, standing, squatting, sleeping, stairs, and transfers  PARTICIPATION LIMITATIONS: meal prep, cleaning, laundry, driving, shopping, community activity, and yard work  PERSONAL FACTORS: 1-2 comorbidities: OA, previous CVA with significant R sided weakness are also affecting patient's functional outcome.   REHAB POTENTIAL: Good  CLINICAL DECISION MAKING: Stable/uncomplicated  EVALUATION COMPLEXITY: Moderate  PLAN:  PT FREQUENCY: 1-2x/week  PT DURATION: 12 weeks  PLANNED INTERVENTIONS: [id number]- PT Re-evaluation, 97750- Physical Performance Testing, 97110-Therapeutic exercises, 97530- Therapeutic activity, [id number]- Neuromuscular re-education, 97535- Self Care, [id number]- Manual therapy, [id number]- Gait  training, [phone]- Orthotic Initial, [phone]- Orthotic/Prosthetic subsequent, [phone]- Canalith repositioning, [phone]- Electrical stimulation (manual), [phone] (1-2 muscles), [phone] (3+ muscles)- Dry Needling, Patient/Family education, Balance training, Stair training, Taping, Joint mobilization, Joint manipulation, Spinal manipulation, Spinal mobilization, Compression bandaging, Vestibular training, DME instructions, Cryotherapy, and Moist heat  PLAN FOR NEXT SESSION:  -continue to work on improvement of ambulation distance tolerance in hospital hallways to decrease self-limiting behavior -decrease duration of rest breaks -step ups onto stairs, focus on L LE -continue BLE functional strengthening activities  Renna Helling, SPT  04/01/24, 4:31 PM

## 2024-04-06 ENCOUNTER — Ambulatory Visit

## 2024-04-06 ENCOUNTER — Ambulatory Visit: Admitting: Physical Therapy

## 2024-04-06 ENCOUNTER — Encounter: Admitting: Speech Pathology

## 2024-04-06 ENCOUNTER — Encounter: Payer: Self-pay | Admitting: Physical Therapy

## 2024-04-06 DIAGNOSIS — R278 Other lack of coordination: Secondary | ICD-10-CM

## 2024-04-06 DIAGNOSIS — M6281 Muscle weakness (generalized): Secondary | ICD-10-CM

## 2024-04-06 DIAGNOSIS — R2689 Other abnormalities of gait and mobility: Secondary | ICD-10-CM

## 2024-04-06 DIAGNOSIS — R269 Unspecified abnormalities of gait and mobility: Secondary | ICD-10-CM

## 2024-04-06 DIAGNOSIS — R296 Repeated falls: Secondary | ICD-10-CM

## 2024-04-06 DIAGNOSIS — R262 Difficulty in walking, not elsewhere classified: Secondary | ICD-10-CM

## 2024-04-06 NOTE — Therapy (Signed)
 OUTPATIENT PHYSICAL THERAPY PHYSICAL THERAPY PROGRESS NOTE   Dates of reporting period  02/27/2024   to   04/06/2024    Patient Name: Erika Stanton MRN: [id number] DOB:09-03-1955, 68 y.o., female Today's Date: 04/06/2024  PCP: Dr. Oneil Galloway  REFERRING PROVIDER: Dr. Oneil Galloway  END OF SESSION:   PT End of Session - 04/06/24 1525     Visit Number 30    Number of Visits 45    Date for Recertification  05/27/24    Progress Note Due on Visit 30    PT Start Time 1531    PT Stop Time 1551    PT Time Calculation (min) 20 min    Equipment Utilized During Treatment Gait belt    Activity Tolerance Patient tolerated treatment well    Behavior During Therapy WFL for tasks assessed/performed               Past Medical History:  Diagnosis Date   Anatomical narrow angle, bilateral    Aneurysm    Asthma    Cerebral aneurysm    Cerebral vasospasm    Cognitive change    Dysphagia, post-stroke    GERD (gastroesophageal reflux disease)    Hemorrhoids    History of cervical dysplasia    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 no recurrent polyps.  suggest repeat surveillance study 11/2022.     COLONOSCOPY W/ POLYPECTOMY  06/2014   Dr Elder at [facility].  3 adenomatoous polyps, anal fissure.     ESOPHAGOGASTRODUODENOSCOPY (EGD) WITH PROPOFOL  N/A 01/27/2020   Procedure: ESOPHAGOGASTRODUODENOSCOPY (EGD) WITH PROPOFOL ;  Surgeon: Shila Gustav GAILS, MD;  Location: [facility] END[facility]COPY;  Service: Endoscopy;  Laterality: N/A;   INSERTION OF PERMANENT  INTRAPERITONEAL CANNULA/CATH N/A    LAPAROSCOPIC   IR 3D INDEPENDENT WKST  01/06/2020   IR ANGIO INTRA EXTRACRAN SEL INTERNAL CAROTID BILAT MOD SED  01/06/2020   IR ANGIO VERTEBRAL SEL VERTEBRAL UNI L MOD SED  01/06/2020   IR ANGIOGRAM FOLLOW UP STUDY  01/06/2020   IR ANGIOGRAM FOLLOW UP STUDY  01/06/2020   IR ANGIOGRAM FOLLOW UP STUDY  01/06/2020   IR ANGIOGRAM FOLLOW UP STUDY  01/06/2020   IR ANGIOGRAM FOLLOW UP STUDY  01/06/2020   IR ANGIOGRAM FOLLOW UP STUDY  01/06/2020   IR ANGIOGRAM FOLLOW UP STUDY  01/06/2020   IR ANGIOGRAM FOLLOW UP STUDY  01/06/2020   IR ANGIOGRAM FOLLOW UP STUDY  01/06/2020   IR ANGIOGRAM FOLLOW UP STUDY  01/06/2020   IR NEURO EACH ADD'L AFTER BASIC UNI RIGHT (MS)  01/06/2020   IR TRANSCATH/EMBOLIZ  01/06/2020   MASTECTOMY     MOHS SURGERY N/A    RADIOLOGY WITH ANESTHESIA N/A 01/06/2020   Procedure: IR WITH ANESTHESIA FOR ANEURYSM;  Surgeon: Lanis Pupa, MD;  Location: [facility] OR;  Service: Radiology;  Laterality: N/A;   STENT SUPPORTED EMBOLIZATION OF RIGHT ICA ANEURYSM Right    TUBAL LIGATION     VENTRICULO-PERITONEAL SHUNT PLACEMENT /  LAPAROSCOPIC INSERTION PERITONEAL CATHETER N/A    Patient Active Problem List   Diagnosis Date Noted   Dyspareunia in female 04/05/2020   History of abnormal cervical Pap smear 03/10/2020   GIB (gastrointestinal bleeding) 02/28/2020   Elevated BUN    Prediabetes    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   Cerebral vasospasm    Sinus tachycardia    Dysphagia, post-stroke    Thrombocytopenia    Acute blood loss anemia    Brain aneurysm    ICH (intracerebral hemorrhage) (HCC) 01/05/2020   History of adenomatous polyp of colon 02/07/2016   Anatomical narrow angle, bilateral 12/14/2014   Fissure in ano 11/11/2014   Hemorrhoids 11/11/2014   Constipation 11/19/2013   GERD (gastroesophageal reflux disease) 03/24/2013   Irritable bowel syndrome with diarrhea 03/24/2013   Herpes zoster 05/14/2005   Abnormal Pap  smear of cervix 05/15/1983   History of cervical dysplasia 05/15/1983    ONSET DATE: 12/02/2023  REFERRING DIAG: I63.9 (ICD-10-CM) - CVA (cerebral vascular accident) (HCC)   THERAPY DIAG:  Muscle weakness (generalized)  Other lack of coordination  Difficulty in walking, not elsewhere classified  Repeated falls  Other abnormalities of gait and mobility  Abnormality of gait and mobility  Rationale for Evaluation and Treatment: Rehabilitation  SUBJECTIVE:                                                                                                                                                                                             SUBJECTIVE STATEMENT:  Pt arrives to session from OT stating she has to leave early in 20 minutes in order to make another appointment she has scheduled. Pt reports same pain in B knees today.  PERTINENT HISTORY:  Patient reports she was in hospital from [facility]- Sat (July 21st- 25th) with CVA affecting left side. I was supposed to have a knee replacement surgery in September but that is on hold for now. I was doing my prep for my colonoscopy and fell in the bathroom.  Patient reports having mild left sided weakness compared to her CVA in 2021 with heavy Right sided UE weakness. She reports bad OA affecting both knees as well. Previous CVA in 2021 affecting Right side. Reports going to ED  last Monday due to fall/CVA affecting Left side. Diagnosed with R CVA with Left Sided weakness.    PAIN:  Are you having pain? B pain knees, 4/10  PRECAUTIONS: Fall  WEIGHT BEARING RESTRICTIONS: No  FALLS: Has patient fallen in last 6 months? Yes. Number of falls fell with the stroke  PATIENT GOALS: Walk without any assistive device.  OBJECTIVE:  Note: Objective measures were completed at evaluation unless otherwise noted.                                                                                                                              TREATMENT  DATE : 04/06/2024  *Progress note today, restricted due to pt reported time limit.*  -Pt performed 5 time sit<>stand (5xSTS): 21.99s, L UE support on chair sec (>15 sec indicates increased fall risk).  -PT instructed pt in TUG: Trial 1: 21.96s, Trial 2: 18.39s sec, 20.175s Avg  (average of 3 trials; >13.5 sec indicates increased fall risk)   -10 Meter Walk Test: Patient instructed to walk 10 meters (32.8 ft) as quickly and as safely as possible at their normal speed x2 and at a fast speed x2. Time measured from 2 meter mark to 8 meter mark to accommodate ramp-up and ramp-down.  Normal speed 1: 0.55 m/s, 18.17s Normal speed 2: 0.47 m/s, 21.17s Average Normal speed: 0.51 m/s Cut off scores: <0.4 m/s = household Ambulator, 0.4-0.8 m/s = limited community Ambulator, >0.8 m/s = community Ambulator, >1.2 m/s = crossing a street, <1.0 = increased fall risk MCID 0.05 m/s (small), 0.13 m/s (moderate), 0.06 m/s (significant)  (ANPTA Core Set of Outcome Measures for Adults with Neurologic Conditions, 2018)  6 Min Walk Test:  Instructed patient to ambulate as quickly and as safely as possible for 6 minutes using LRAD. Patient was allowed to take standing rest breaks without stopping the test, but if the patient required a sitting rest break the clock would be stopped and the test would be over.  Results: 525 feet (160.02 meters, Avg speed 0.48m/s) using a SPC with CGA. Results indicate that the patient has reduced endurance with ambulation compared to age matched norms.  Age Matched Norms: 15-69 yo M: 30 F: 26, 49-79 yo M: 49 F: 471, 18-89 yo M: 417 F: 392 MDC: 58.21 meters (190.98 feet) or 50 meters (ANPTA Core Set of Outcome Measures for Adults with Neurologic Conditions, 2018)     PATIENT EDUCATION: Education details: PT plan of care; Discussion of symptoms of CVA and to go to ED as soon as possible. Purpose of PT for balance, strength, coordination and endurance.  Person educated:  Patient Education method: Explanation Education comprehension: verbalized understanding  HOME EXERCISE PROGRAM: Access Code: [email or web address] URL: [email or web address] Date: 12/17/2023 Prepared by: Sidra Simpers  Program Notes **Be sure to perform all exercises next to a countertop or sturdy piece of furniture in case you become unsteady.  Exercises - Standing Heel Raise with Support  - 1 x daily - 7 x weekly - 2 sets - 15 reps - Standing Toe Raises at Chair  - 1 x daily - 7 x weekly - 2 sets - 15 reps - Mini Squat with Counter Support  - 1 x daily - 7 x weekly - 2 sets - 15 reps - Lunge with Counter Support  - 1 x daily -  7 x weekly - 2 sets - 15 reps - Standing Tandem Balance with Counter Support  - 1 x daily - 7 x weekly - 2 sets - 2 reps - 30 hold - Standing Single Leg Stance with Counter Support  - 1 x daily - 7 x weekly - 2 sets - 2 reps - 30 hold  GOALS: Goals reviewed with patient? Yes  SHORT TERM GOALS: Target date: 01/21/2024  Pt will be independent with HEP in order to improve strength and balance in order to decrease fall risk and improve function at home.  Baseline: EVAL - No current formal HEP in place 03/04/2024- Patient reports independent with all current HEP and riding a stationary bike.  Goal status: MET   LONG TERM GOALS: Target date: 05/27/2024  1.  Patient will complete five times sit to stand test in < 15 seconds indicating an increased LE strength and improved balance. Baseline: EVAL= 25.20 sec with LUE Support 9/2: 23.36 without UE support and CGA-min assist. 20.10 sec with LUE support 03/04/2024- more difficulty raising without UE but performed 18.75 sec with LUE Support 04/06/24: 21.99s with L UE support on chair rail Goal status: IN PROGRESS   2.  Patient will increase Berg Balance score by > 6 points to demonstrate decreased fall risk during functional activities. Baseline: EVAL: To be assessed next visit; 12/12/2023= 42/56 9/2: 44/56   10/16:45/56 04/06/2024: test at next session Goal status: PROGRESSING   3.  Patient will reduce timed up and go to <11 seconds to reduce fall risk and demonstrate improved transfer/gait ability. Baseline: EVAL = 40 sec with RW 9/2: 18.33sec with SPC 10/16:17.47 sec with SPC 04/06/24: 20.17s w/ SPC Goal status: PROGRESSING  4.  Patient will increase 10 meter walk test to >1.12m/s as to improve gait speed for better community ambulation and to reduce fall risk. Baseline: EVAL: 0.65 m/s 9/2: 0.67 m/s with [facility] 9/9: 0.71 m/s without an AD and wearing 2.5 # AW 10/16: .75 m/s with [facility] 04/06/24: 0.51 m/s Goal status: PROGRESSING  5. Patient will increase six minute walk test distance by 200 feet for progression to community ambulator and improve gait ability Baseline: EVAL= 482' 9/2: 465 feet using a SPC with CGA required seated rest break at 4:73min   10/16: 542 ft and full 6 minutes  04/06/24: 525 ft using SPC with CGA, 3 rest breaks taken throughout, @ 2:31 min, 3:44min, and 5:59min Goal status: PROGRESSING   ASSESSMENT:  CLINICAL IMPRESSION:  Pt arrived to session today stating appointment must be cut short due to another obligation, which may have limited pt performance in functional tests today. Progress note performed today, and 4/5 tests were able to be completed. Pt TUG score today's shows decrease from previous check in, indicating an increased risk of falls. Pt average score today shows 0.79m/s classifying her as a limited community ambulator, and showing decreased speed from last check in. Pt 5STS score today indicates increased fall risk, and showing decrease since last time. Pt required 3 standing rest breaks during today due to reported L knee pain due to not being able to warm up on Nustep. Pt gait speed during test shows 0.26m/s, again classifying her as a limited community ambulator. Will plan to perform BERG at next session to check balance progression. Pt  will continue to benefit from skilled therapy to address remaining deficits in order to improve overall QoL and return to PLOF.    OBJECTIVE IMPAIRMENTS: Abnormal gait, decreased  activity tolerance, decreased balance, decreased coordination, decreased endurance, decreased mobility, difficulty walking, decreased ROM, decreased strength, impaired UE functional use, and pain.   ACTIVITY LIMITATIONS: carrying, lifting, bending, sitting, standing, squatting, sleeping, stairs, and transfers  PARTICIPATION LIMITATIONS: meal prep, cleaning, laundry, driving, shopping, community activity, and yard work  PERSONAL FACTORS: 1-2 comorbidities: OA, previous CVA with significant R sided weakness are also affecting patient's functional outcome.   REHAB POTENTIAL: Good  CLINICAL DECISION MAKING: Stable/uncomplicated  EVALUATION COMPLEXITY: Moderate  PLAN:  PT FREQUENCY: 1-2x/week  PT DURATION: 12 weeks  PLANNED INTERVENTIONS: 97164- PT Re-evaluation, 97750- Physical Performance Testing, 97110-Therapeutic exercises, 97530- Therapeutic activity, [id number]- Neuromuscular re-education, 97535- Self Care, [id number]- Manual therapy, [id number]- Gait training, [phone]- Orthotic Initial, [phone]- Orthotic/Prosthetic subsequent, [phone]- Canalith repositioning, [phone]- Electrical stimulation (manual), [phone] (1-2 muscles), [id number] (3+ muscles)- Dry Needling, Patient/Family education, Balance training, Stair training, Taping, Joint mobilization, Joint manipulation, Spinal manipulation, Spinal mobilization, Compression bandaging, Vestibular training, DME instructions, Cryotherapy, and Moist heat  PLAN FOR NEXT SESSION:  -reassess all outcome measures for goal update -continue to work on improvement of ambulation distance tolerance in hospital hallways to decrease self-limiting behavior -decrease duration of rest breaks -step ups onto stairs, focus on L LE -continue BLE functional strengthening activities  Renna Helling, SPT  04/06/24,  3:55 PM

## 2024-04-06 NOTE — Therapy (Signed)
 Outpatient Occupational Therapy Neuro Treatment Note  Patient Name: Erika Stanton MRN: [id number] DOB:1956/03/15, 68 y.o., female Today's Date: 04/06/2024  PCP: Laurice Anes, MD REFERRING PROVIDER: Laurice Anes, MD   OT End of Session - 04/06/24 1533     Visit Number 32    Number of Visits 48    Date for Recertification  05/25/24    Authorization Time Period Reporting period beginning 03/30/24    OT Start Time 1457    OT Stop Time 1530    OT Time Calculation (min) 33 min    Equipment Utilized During Treatment transport chair, SPC    Activity Tolerance Patient tolerated treatment well    Behavior During Therapy WFL for tasks assessed/performed         Past Medical History:  Diagnosis Date   Anatomical narrow angle, bilateral    Aneurysm    Asthma    Cerebral aneurysm    Cerebral vasospasm    Cognitive change    Dysphagia, post-stroke    GERD (gastroesophageal reflux disease)    Hemorrhoids    History of cervical dysplasia    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 no recurrent polyps.  suggest repeat surveillance study 11/2022.     COLONOSCOPY W/ POLYPECTOMY  06/2014   Dr Elder at [facility].  3 adenomatoous polyps, anal fissure.     ESOPHAGOGASTRODUODENOSCOPY (EGD) WITH PROPOFOL  N/A 01/27/2020   Procedure: ESOPHAGOGASTRODUODENOSCOPY (EGD) WITH PROPOFOL ;  Surgeon: Shila Gustav GAILS, MD;  Location: [facility] ENDOSCOPY;  Service: Endoscopy;  Laterality: N/A;   INSERTION OF PERMANENT INTRAPERITONEAL CANNULA/CATH N/A    LAPAROSCOPIC   IR 3D  INDEPENDENT WKST  01/06/2020   IR ANGIO INTRA EXTRACRAN SEL INTERNAL CAROTID BILAT MOD SED  01/06/2020   IR ANGIO VERTEBRAL SEL VERTEBRAL UNI L MOD SED  01/06/2020   IR ANGIOGRAM FOLLOW UP STUDY  01/06/2020   IR ANGIOGRAM FOLLOW UP STUDY  01/06/2020   IR ANGIOGRAM FOLLOW UP STUDY  01/06/2020   IR ANGIOGRAM FOLLOW UP STUDY  01/06/2020   IR ANGIOGRAM FOLLOW UP STUDY  01/06/2020   IR ANGIOGRAM FOLLOW UP STUDY  01/06/2020   IR ANGIOGRAM FOLLOW UP STUDY  01/06/2020   IR ANGIOGRAM FOLLOW UP STUDY  01/06/2020   IR ANGIOGRAM FOLLOW UP STUDY  01/06/2020   IR ANGIOGRAM FOLLOW UP STUDY  01/06/2020   IR NEURO EACH ADD'L AFTER BASIC UNI RIGHT (MS)  01/06/2020   IR TRANSCATH/EMBOLIZ  01/06/2020   MASTECTOMY     MOHS SURGERY N/A    RADIOLOGY WITH ANESTHESIA N/A 01/06/2020   Procedure: IR WITH ANESTHESIA FOR ANEURYSM;  Surgeon: Lanis Pupa, MD;  Location: [facility] OR;  Service: Radiology;  Laterality: N/A;   STENT SUPPORTED EMBOLIZATION OF RIGHT ICA ANEURYSM Right    TUBAL LIGATION     VENTRICULO-PERITONEAL SHUNT PLACEMENT / LAPAROSCOPIC INSERTION PERITONEAL CATHETER N/A    Patient Active Problem List   Diagnosis Date Noted   Dyspareunia in female 04/05/2020  History of abnormal cervical Pap smear 03/10/2020   GIB (gastrointestinal bleeding) 02/28/2020   Elevated BUN    Prediabetes    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   Cerebral vasospasm    Sinus tachycardia    Dysphagia, post-stroke    Thrombocytopenia    Acute blood loss anemia    Brain aneurysm    ICH (intracerebral hemorrhage) (HCC) 01/05/2020   History of adenomatous polyp of colon 02/07/2016   Anatomical narrow angle, bilateral 12/14/2014   Fissure in ano 11/11/2014   Hemorrhoids 11/11/2014   Constipation 11/19/2013   GERD (gastroesophageal reflux disease) 03/24/2013   Irritable bowel syndrome with diarrhea 03/24/2013   Herpes zoster 05/14/2005   Abnormal Pap smear of cervix 05/15/1983   History of cervical dysplasia  05/15/1983   ONSET DATE: 12/02/2023  REFERRING DIAG: L ischemic CVA  THERAPY DIAG:  Muscle weakness (generalized)  Other lack of coordination  Rationale for Evaluation and Treatment: Rehabilitation  SUBJECTIVE:  SUBJECTIVE STATEMENT: Pt reports doing well today.  PERTINENT HISTORY:  Pt. was admitted to [facility] from 12/02/23-12/06/23 with an Acute Right MCA CVA, Hydrocephalus with shunt  placement. History of Subarachnoid Hemorrhage with bilateral ICA coiled aneurysms. 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  PRECAUTIONS: None  WEIGHT BEARING RESTRICTIONS: No  PAIN: 04/06/24: No UE pain, 3-4/10 in knees Are you having pain? No  FALLS: Has patient fallen in last 6 months? Yes. Number of falls 1  LIVING ENVIRONMENT: Lives with: lives with their family and lives with their spouse Lives in: House/apartment Stairs: hand rails, 2 steps   Has following equipment at home: [name], cane  PLOF: Independent with basic ADLs, Spouse help as needed  PATIENT GOALS: Strengthening  OBJECTIVE:  Note: Objective measures were completed at Evaluation unless otherwise noted.  HAND DOMINANCE: Left  ADLs: Overall ADLs: spouse assists when needed, Pt. reports that she can do most tasks herself. Uses her left hand to engage in self-care tasks. Eating: Able to cut foods with a fork, husband assists as needed.  Grooming: husband assists with applying toothpaste, able to brush teeth and hair UB Dressing: Spouse assists with applying/fastening bra, Pt. Requires assistance with zippers, and buttons. LB Dressing: Spouse assists with tying shoes when needed, uses slip on shoes. Pt. Does not zip pants. Toileting: Able to complete  independently Bathing: Pt. Requires intermittent assistance with bathing tasks. Tub Shower transfers: Spouse assists with transfers when/if needed Equipment: Shower seat with back  IADLs: Shopping: Spouse typically does the shopping, isnt able to carry heavy objects.  Light housekeeping: Spouse typically does most of the cleaning around the home, Pt. Cleans bathroom, and laundry Pt. Is unable to carry laundry due to exacerbation of weakness.  Meal Prep: Spouse typically cooks and prepares meals.  Community mobility: Pt. Requires assistance using walker. Pt. reports that she is unable to carry heavy items, and typically does not do the shopping.  Medication management: Pt. is able to take care of meds with pillbox Financial management:  Spouse takes care of it, and makes online payments.  Handwriting: 50% legible  MOBILITY STATUS: Needs Assist: Pt. Requires use of a walker   POSTURE COMMENTS:  No Significant postural limitations Sitting balance: Good  ACTIVITY TOLERANCE: Activity tolerance:   FUNCTIONAL OUTCOME MEASURES: TBD  UPPER EXTREMITY ROM:    Active ROM Right eval Right 01/16/24 Right 03/02/24 Right 04/01/24  Left eval Left 01/16/24 Left 03/02/24 Left 04/01/24  Shoulder flexion 94(121) 100 with scaption (132) 126(146) 100 some scaption (145) 102(125) 134 with scaption (145) 142(148) 140 (155)  Shoulder abduction 75(106) 74 (98) 93(120) 58(90) 110(126) 140 (170) WNL 165 (180)  Shoulder adduction          Shoulder extension          Shoulder internal rotation          Shoulder external rotation          Elbow flexion 135(155)  150(155)  140(150)  150   Elbow extension 0  0  -10(-9)  -4(0)   Wrist flexion 40(44)  48(60)  42(58)  52(60)   Wrist extension 30(34)  48(60)  40(50)  52(56)   Wrist ulnar deviation          Wrist radial deviation          Wrist pronation          Wrist supination          (Blank rows = not tested)  UPPER EXTREMITY MMT:     MMT  Right eval Right 03/02/24 Right 04/01/24 Left eval Left 03/02/24 Left 04/01/24  Shoulder flexion 3-/5 3+/5 3- 3-/5 4/5 4/5  Shoulder abduction 3-/5 3-/5 3- 3-/5 4/5 4/5  Shoulder adduction        Shoulder extension        Shoulder internal rotation        Shoulder external rotation        Middle trapezius        Lower trapezius        Elbow flexion 4-/5 4+/5 4+ 4+/5 5/5 5/5  Elbow extension 4-/5 4+/5 4/5 4+/5 5/5 5/5  Wrist flexion 4-/5 4-/5 4/5 3+/5 4/5 4+/5  Wrist extension 4-/5 4-/5 4- 3+/5 4/5 4+/5  Wrist ulnar deviation        Wrist radial deviation        Wrist pronation        Wrist supination        (Blank rows = not tested)  HAND FUNCTION Eval:  Grip strength: Right: N/T lbs; Left: 43 lbs, Lateral pinch: Right: N/T lbs, Left: 9 lbs, and 3 point pinch: Right: N/T lbs, Left: 5 lbs Pt. Presents with flexor tightness in the right 5th digit, however is able to passively extend 5th digit within normal range. Pt. Also Presents with hyper extension in the 2nd and 3rd digit at PIP joints. Pt. Is able to achieve full Digit MP, PIP, and DIP PROM.  01/16/24: Grip strength: Left: 33 lbs; Lateral pinching: Left: 8 lbs; 3 point pinch: Left: 3 lbs  03/02/24: Grip strength: Left: 36 lbs; Lateral pinching: Left: 8 lbs; 3 point pinch: Left: 3 lbs  04/01/24: Grip strength: Left: (3 trials) 32 lbs, 26 lbs, 34 lbs; Lateral pinch: Pinch gauge unavailable (out for re-calibration)   COORDINATION: 9 Hole Peg test: Right: N/T sec; Left: 51 sec 01/16/24: Left: 1 min 1 sec 03/02/24:  Left: 1 min 1 sec 04/01/24: Left: 58 sec  SENSATION: Not tested  EDEMA:   MUSCLE TONE: Fluctuating flexor tone in R hand.  COGNITION: Overall cognitive status: Within functional limits for tasks assessed  VISION: Subjective report:  Baseline vision:  Visual history:   VISION ASSESSMENT:  PERCEPTION:   PRAXIS:   OBSERVATIONS: Pt. presents with fluctuating flexor tone in  the R hand, involving the  5th digit at the PIP/DIP joint. Pt. presents with hyperextension at the 2nd and 3rd digits at the PIP joints.                                                                                                                    TREATMENT DATE: 04/06/24 Therapeutic Activity: -Facilitated L hand strengthening, including sustained grip/pinch/pushing/pulling using Puttycize tools with pink theraputty; pt used the knob turn, cap turn, peg turn, L-Bar and key turn attachments to target specific components of movements in the hand.  Min vc and demo for grasp patterns.  Therapeutic Exercise: -L grip strengthening: Hand gripper set at moderate resistance (2 red bands, 1 yellow band) to complete grip squeezes x3 sets 15 reps; min vc and demo for maximizing digit flexion with each gripping rep -Visual handouts issued per pt request for self passive R shoulder flex/abd stretches with table slides; demo and min vc for technique provided.  PATIENT EDUCATION: Education details: LUE strengthening Person educated: Patient Education method: Explanation and Verbal cues Education comprehension: verbal cues required  HOME EXERCISE PROGRAM: -Pink theraputty; visual handout issued -Self and caregiver assisted PROM to R hand and wrist -Monitor splint with skin checks  GOALS: Goals reviewed with patient? Yes  SHORT TERM GOALS: Target date: 04/13/2024   Pt. Will be independent with HEP for UE functioning. Baseline: Eval: No current HEP; 01/16/24: pt reports doing a little writing and squeezing a ball, but denies using putty; spouse stretches RUE daily 02/20/24:  Pt. Continues to do a little writing and squeezing a ball, but denies using putty; spouse stretches RUE 03/02/24: Pt. Husband assists Pt. With ROM to the right hand. Pt. reports doing hand exercises; 03/30/24: Spouse indep with HEP (spouse assists daily) Goal status: achieved  LONG TERM GOALS: Target date: 05/25/2024  Pt. Will improve [facility] skills to be able to  manipulate small objects at home Baseline: 9 hole peg test: L: 51 seconds; 01/16/24: L 1 min 1 sec, 03/02/24: Left: 1 min. & 1 sec. Pt. Continues to work towards using the left to manipulate small objects; 04/01/24: L 58 sec (multiple trials to improve time) Goal status: Ongoing  2.  Pt. Will increase BUE shoulder ROM by 10 degrees to be able to reach into cabinets and shelves.  Baseline: Shoulder flexion: R: 94(121), L: 102(125), Shoulder abduction: R: 75(106), L: 110(126); 01/16/24: Shoulder flexion: R: 100 with scaption (132), L: 134 with scaption (145), Shoulder abduction: R: 74 (98), L: 140 (170)02/20/24: Pt. Is improving with functional reaching through multiple planes with cues, and assist required. 03/02/24: Shoulder flexion: R: 126(146), L: 142(148), Shoulder abduction: R: 93(120), L: WNL; 03/30/24: ROM to be updated next session; limited by time constraints with poc review, though pt reports she does not reach into cabinets; 04/01/24: Shoulder flexion: R: 100 (145), L: 140 (155); Shoulder abd: R: 50 (90), L: 165 (180); pt reports she does not reach into cabinets Goal status: Ongoing  3.  Pt. Will improve  L grip strength by 5# to be able to securely grasp ADL items at home.  Baseline: Grip strength: R: NT, L: 43#; 01/16/24: L: 33#  03/02/24: 36#; 04/01/24: L: 34 lbs (best of 3 trials) Goal status: Ongoing  4.  Pt. Will increase L lateral key pinch strength by 3#  to open wide mouth jars.  Baseline: Lateral key pinch: R: NT, L: 9#; 01/16/24: L: 8# 03/02/24: Pinch meter is out for calibration. Pt. Continues to have difficulty opening jars; 04/01/24: NT (pinch gauge not available); continued difficulty opening jars Goal status: Ongoing  5.  Pt. Will independently engage the right hand as a gross assist to the left hand 100% of the time during ADLs/IADLs. Baseline: Eval: Pt. With limited engagement of the right hand as a gross assist during tasks; 01/16/24: Observed <25% of the time during OT sessions  02/20/24: Pt. Continues to present with limited engagement of the RUE as a gross assist to the left hand approximately 25% of the time with cues. 03/02/24: Pt. Is starting to engage her right hand more as a gross assist to the left; 04/01/24: Pt engages the R hand as a fair assist to the L with intermittent vc from OT during tx sessions; pt admits she needs to use the R hand more at home. Goal status: Ongoing  6.  Pt. Will be able to manipulate zippers, and buttons efficiently with modified independence Baseline: Eval:Right: NT Left: 51 sec; 01/16/24: Left: 1 min and 1 sec 02/20/24: Pt. continues to have difficulty manipulating zippers and buttons.03/02/24: Pt. continues to have difficulty manipulating zippers, and buttons, and may benefit from reviewing adaptive devices; 04/01/24: Pt reports that she continues to avoid clothes with fasteners Goal status: Ongoing   7. Pt. Will improve handwriting legibility to 100% for one sentence in printed form for written correspondence.  Baseline: 50% legible in printed form for first name only; 01/16/24: 80-90% legible to print first name 1 of 4 attempts.  3 of 4 attempts 50% or less legible 02/20/24: first name only 100%, 25% for lists of words. 03/02/24:  first name only 100%, 25% for lists of words; 04/01/24: 100% legibility for a short 3 word sentence using L hand.  Goal status: achieved  8. Pt. Will independently, and efficiently transfer small 1/2 objects from the left hand to the right hand in midline in preparation from reaching up  to place items at a target with 100% accuracy with the right hand. Baseline: 03/02/24: Pt. Has progressed to transferring 1.5 objects between her hand in midline, and reach up to place them at targets with 60% accuracy; 03/30/24: Pt can transfer jumbo pegs and clothespins (~1/5) from R to L hand with extra time, repeat trials, and occasional dropping. Pt continues to reach toward a target with ~60% accuracy.  Goal Status:  ongoing  9. Pt. Will increased BUE strength by 2 mm grades to assist with ADLs, and IADLs.  Baseline: 03/02/24: UE strength: Shoulder flexion: R: 3+/5 L: 4/5, abduction: R: 3-/5, L 4/5:  elbow flexion: R:4+/5, L: 5/5, extension: R: 4+/5, L:5/5, wrist flexion: R: 4-/5  L: 4/5 wrist extension: R: 4-/5 L: 4/5; 04/01/24: see chart; no significant changes  Goal Status: New: 03/02/24  ASSESSMENT: CLINICAL IMPRESSION: Pt with good tolerance to above noted LUE strengthening activities, requiring min vc and demo for gripping and pinching patterns using puttycise tools.  Pt verbalized understanding of visual printouts for performing table slides for self passive stretching/AAROM to R shoulder.  Pt. continues  to benefit from OT services to increase BUE ROM in the shoulder, wrist and hands, as well as increasing L grip/pinch strength and improve [facility] skills to be able to perform her desired ADL/IADLs.    PERFORMANCE DEFICITS: in functional skills including ADLs, IADLs, coordination, dexterity, edema, tone, ROM, strength, pain, Fine motor control, Gross motor control, endurance, and UE functional use, and psychosocial skills including coping strategies, environmental adaptation, habits, and routines and behaviors.   IMPAIRMENTS: are limiting patient from ADLs, IADLs, rest and sleep, leisure, and social participation.   CO-MORBIDITIES: may have co-morbidities  that affects occupational performance. Patient will benefit from skilled OT to address above impairments and improve overall function.  MODIFICATION OR ASSISTANCE TO COMPLETE EVALUATION: Min-Moderate modification of tasks or assist with assess necessary to complete an evaluation.  OT OCCUPATIONAL PROFILE AND HISTORY: Detailed assessment: Review of records and additional review of physical, cognitive, psychosocial history related to current functional performance.  CLINICAL DECISION MAKING: Moderate - several treatment options, min-mod task modification  necessary  REHAB POTENTIAL: Good  EVALUATION COMPLEXITY: Moderate  PLAN:  OT FREQUENCY: 2x/week  OT DURATION: 12 weeks  PLANNED INTERVENTIONS: 97168 OT Re-evaluation, 97535 self care/ADL training, 02889 therapeutic exercise, 97530 therapeutic activity, 97112 neuromuscular re-education, 97140 manual therapy, 97018 paraffin, 02960 fluidotherapy, 97010 moist heat, 97010 cryotherapy, 97034 contrast bath, 97032 electrical stimulation (manual), 97760 Orthotic Initial, passive range of motion, energy conservation, coping strategies training, patient/family education, and DME and/or AE instructions  RECOMMENDED OTHER SERVICES: PT   CONSULTED AND AGREED WITH PLAN OF CARE: Patient  PLAN FOR NEXT SESSION: Treatment  Inocente Blazing, MS, OTR/L

## 2024-04-08 ENCOUNTER — Encounter: Admitting: Speech Pathology

## 2024-04-08 ENCOUNTER — Ambulatory Visit

## 2024-04-08 ENCOUNTER — Ambulatory Visit: Admitting: Physical Therapy

## 2024-04-13 ENCOUNTER — Ambulatory Visit: Admitting: Physical Therapy

## 2024-04-13 ENCOUNTER — Encounter: Admitting: Speech Pathology

## 2024-04-13 ENCOUNTER — Ambulatory Visit

## 2024-04-13 ENCOUNTER — Telehealth: Payer: Self-pay

## 2024-04-13 DIAGNOSIS — L304 Erythema intertrigo: Secondary | ICD-10-CM

## 2024-04-13 MED ORDER — NYSTATIN 100000 UNIT/GM EX OINT: TOPICAL_OINTMENT | CUTANEOUS | 11 refills | Status: AC

## 2024-04-13 NOTE — Telephone Encounter (Addendum)
 Patient called and left message on nurse vm line stating she was seen by Dr. Raymund and prescribed nystatin  ointment but when she went to pharmacy they had given her powder. Requesting rx be changed to ointment.  Reviewed patient's last visit and verified rx should have been nystatin  ointment but powder was sent instead. Rx changed back to ointment and sent to pharmacy.  Tried calling patient to notify her of the change. No answer. Lm for patient to return call.  Patient returned called back and was informed of the medication being fixed and sent to pharmacy.  Denied further questions or concerns.

## 2024-04-15 ENCOUNTER — Ambulatory Visit: Attending: Family Medicine

## 2024-04-15 ENCOUNTER — Encounter: Admitting: Speech Pathology

## 2024-04-15 ENCOUNTER — Encounter: Payer: Self-pay | Admitting: Physical Therapy

## 2024-04-15 ENCOUNTER — Ambulatory Visit: Admitting: Physical Therapy

## 2024-04-15 DIAGNOSIS — R296 Repeated falls: Secondary | ICD-10-CM | POA: Diagnosis present

## 2024-04-15 DIAGNOSIS — M6281 Muscle weakness (generalized): Secondary | ICD-10-CM | POA: Insufficient documentation

## 2024-04-15 DIAGNOSIS — R2689 Other abnormalities of gait and mobility: Secondary | ICD-10-CM

## 2024-04-15 DIAGNOSIS — R262 Difficulty in walking, not elsewhere classified: Secondary | ICD-10-CM

## 2024-04-15 DIAGNOSIS — R41841 Cognitive communication deficit: Secondary | ICD-10-CM | POA: Diagnosis present

## 2024-04-15 DIAGNOSIS — R278 Other lack of coordination: Secondary | ICD-10-CM | POA: Diagnosis present

## 2024-04-15 DIAGNOSIS — R269 Unspecified abnormalities of gait and mobility: Secondary | ICD-10-CM | POA: Diagnosis present

## 2024-04-15 NOTE — Therapy (Signed)
 Outpatient Occupational Therapy Neuro Treatment Note  Patient Name: Erika Stanton MRN: [id number] DOB:[birth year], 68 y.o., female Today's Date: 04/15/2024  PCP: Laurice Anes, MD REFERRING PROVIDER: Laurice Anes, MD   OT End of Session - 04/15/24 1633     Visit Number 33    Number of Visits 48    Date for Recertification  05/25/24    Authorization Time Period Reporting period beginning 03/30/24    OT Start Time 1403    OT Stop Time 1445    OT Time Calculation (min) 42 min    Equipment Utilized During Treatment transport chair, SPC    Activity Tolerance Patient limited by lethargy;Other (comment)   lack of motivaiton   Behavior During Therapy WFL for tasks assessed/performed         Past Medical History:  Diagnosis Date   Anatomical narrow angle, bilateral    Aneurysm    Asthma    Cerebral aneurysm    Cerebral vasospasm    Cognitive change    Dysphagia, post-stroke    GERD (gastroesophageal reflux disease)    Hemorrhoids    History of cervical dysplasia    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 no recurrent polyps.  suggest repeat surveillance study 11/2022.     COLONOSCOPY W/ POLYPECTOMY  06/2014   Dr Elder at [facility].  3 adenomatoous polyps, anal fissure.     ESOPHAGOGASTRODUODENOSCOPY (EGD) WITH PROPOFOL  N/A 01/27/2020   Procedure: ESOPHAGOGASTRODUODENOSCOPY (EGD) WITH PROPOFOL ;  Surgeon: Shila Gustav GAILS, MD;  Location: [facility] ENDOSCOPY;  Service: Endoscopy;  Laterality: N/A;   INSERTION OF PERMANENT INTRAPERITONEAL CANNULA/CATH N/A     LAPAROSCOPIC   IR 3D INDEPENDENT WKST  01/06/2020   IR ANGIO INTRA EXTRACRAN SEL INTERNAL CAROTID BILAT MOD SED  01/06/2020   IR ANGIO VERTEBRAL SEL VERTEBRAL UNI L MOD SED  01/06/2020   IR ANGIOGRAM FOLLOW UP STUDY  01/06/2020   IR ANGIOGRAM FOLLOW UP STUDY  01/06/2020   IR ANGIOGRAM FOLLOW UP STUDY  01/06/2020   IR ANGIOGRAM FOLLOW UP STUDY  01/06/2020   IR ANGIOGRAM FOLLOW UP STUDY  01/06/2020   IR ANGIOGRAM FOLLOW UP STUDY  01/06/2020   IR ANGIOGRAM FOLLOW UP STUDY  01/06/2020   IR ANGIOGRAM FOLLOW UP STUDY  01/06/2020   IR ANGIOGRAM FOLLOW UP STUDY  01/06/2020   IR ANGIOGRAM FOLLOW UP STUDY  01/06/2020   IR NEURO EACH ADD'L AFTER BASIC UNI RIGHT (MS)  01/06/2020   IR TRANSCATH/EMBOLIZ  01/06/2020   MASTECTOMY     MOHS SURGERY N/A    RADIOLOGY WITH ANESTHESIA N/A 01/06/2020   Procedure: IR WITH ANESTHESIA FOR ANEURYSM;  Surgeon: Lanis Pupa, MD;  Location: [facility] OR;  Service: Radiology;  Laterality: N/A;   STENT SUPPORTED EMBOLIZATION OF RIGHT ICA ANEURYSM Right    TUBAL LIGATION     VENTRICULO-PERITONEAL SHUNT PLACEMENT / LAPAROSCOPIC INSERTION PERITONEAL CATHETER N/A    Patient Active Problem List   Diagnosis Date Noted   Dyspareunia  in female 04/05/2020   History of abnormal cervical Pap smear 03/10/2020   GIB (gastrointestinal bleeding) 02/28/2020   Elevated BUN    Prediabetes    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   Cerebral vasospasm    Sinus tachycardia    Dysphagia, post-stroke    Thrombocytopenia    Acute blood loss anemia    Brain aneurysm    ICH (intracerebral hemorrhage) (HCC) 01/05/2020   History of adenomatous polyp of colon 02/07/2016   Anatomical narrow angle, bilateral 12/14/2014   Fissure in ano 11/11/2014   Hemorrhoids 11/11/2014   Constipation 11/19/2013   GERD (gastroesophageal reflux disease) 03/24/2013   Irritable bowel syndrome with diarrhea 03/24/2013   Herpes zoster 05/14/2005   Abnormal Pap smear of cervix 05/15/1983   History  of cervical dysplasia 05/15/1983   ONSET DATE: 12/02/2023  REFERRING DIAG: L ischemic CVA  THERAPY DIAG:  Muscle weakness (generalized)  Other lack of coordination  Rationale for Evaluation and Treatment: Rehabilitation  SUBJECTIVE:  SUBJECTIVE STATEMENT: Pt reports being tired today.  PERTINENT HISTORY:  Pt. was admitted to [facility] from 12/02/23-12/06/23 with an Acute Right MCA CVA, Hydrocephalus with shunt  placement. History of Subarachnoid Hemorrhage with bilateral ICA coiled aneurysms. 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  PRECAUTIONS: None  WEIGHT BEARING RESTRICTIONS: No  PAIN: 04/15/24: No UE pain, 3-4/10 in knees Are you having pain? No  FALLS: Has patient fallen in last 6 months? Yes. Number of falls 1  LIVING ENVIRONMENT: Lives with: lives with their family and lives with their spouse Lives in: House/apartment Stairs: hand rails, 2 steps   Has following equipment at home: [name], cane  PLOF: Independent with basic ADLs, Spouse help as needed  PATIENT GOALS: Strengthening  OBJECTIVE:  Note: Objective measures were completed at Evaluation unless otherwise noted.  HAND DOMINANCE: Left  ADLs: Overall ADLs: spouse assists when needed, Pt. reports that she can do most tasks herself. Uses her left hand to engage in self-care tasks. Eating: Able to cut foods with a fork, husband assists as needed.  Grooming: husband assists with applying toothpaste, able to brush teeth and hair UB Dressing: Spouse assists with applying/fastening bra, Pt. Requires assistance with zippers, and buttons. LB Dressing: Spouse assists with tying shoes when needed, uses slip on shoes. Pt. Does not zip  pants. Toileting: Able to complete independently Bathing: Pt. Requires intermittent assistance with bathing tasks. Tub Shower transfers: Spouse assists with transfers when/if needed Equipment: Shower seat with back  IADLs: Shopping: Spouse typically does the shopping, isnt able to carry heavy objects.  Light housekeeping: Spouse typically does most of the cleaning around the home, Pt. Cleans bathroom, and laundry Pt. Is unable to carry laundry due to exacerbation of weakness.  Meal Prep: Spouse typically cooks and prepares meals.  Community mobility: Pt. Requires assistance using walker. Pt. reports that she is unable to carry heavy items, and typically does not do the shopping.  Medication management: Pt. is able to take care of meds with pillbox Financial management:  Spouse takes care of it, and makes online payments.  Handwriting: 50% legible  MOBILITY STATUS: Needs Assist: Pt. Requires use of a walker   POSTURE COMMENTS:  No Significant postural limitations Sitting balance: Good  ACTIVITY TOLERANCE: Activity tolerance:  FUNCTIONAL OUTCOME MEASURES: TBD  UPPER EXTREMITY ROM:    Active ROM Right eval Right 01/16/24 Right 03/02/24 Right 04/01/24  Left eval Left 01/16/24 Left 03/02/24 Left 04/01/24  Shoulder flexion 94(121) 100 with scaption (132) 126(146) 100 some scaption (145) 102(125) 134 with scaption (145) 142(148) 140 (155)  Shoulder abduction 75(106) 74 (98) 93(120) 58(90) 110(126) 140 (170) WNL 165 (180)  Shoulder adduction          Shoulder extension          Shoulder internal rotation          Shoulder external rotation          Elbow flexion 135(155)  150(155)  140(150)  150   Elbow extension 0  0  -10(-9)  -4(0)   Wrist flexion 40(44)  48(60)  42(58)  52(60)   Wrist extension 30(34)  48(60)  40(50)  52(56)   Wrist ulnar deviation          Wrist radial deviation          Wrist pronation          Wrist supination          (Blank rows = not tested)  UPPER  EXTREMITY MMT:     MMT Right eval Right 03/02/24 Right 04/01/24 Left eval Left 03/02/24 Left 04/01/24  Shoulder flexion 3-/5 3+/5 3- 3-/5 4/5 4/5  Shoulder abduction 3-/5 3-/5 3- 3-/5 4/5 4/5  Shoulder adduction        Shoulder extension        Shoulder internal rotation        Shoulder external rotation        Middle trapezius        Lower trapezius        Elbow flexion 4-/5 4+/5 4+ 4+/5 5/5 5/5  Elbow extension 4-/5 4+/5 4/5 4+/5 5/5 5/5  Wrist flexion 4-/5 4-/5 4/5 3+/5 4/5 4+/5  Wrist extension 4-/5 4-/5 4- 3+/5 4/5 4+/5  Wrist ulnar deviation        Wrist radial deviation        Wrist pronation        Wrist supination        (Blank rows = not tested)  HAND FUNCTION Eval:  Grip strength: Right: N/T lbs; Left: 43 lbs, Lateral pinch: Right: N/T lbs, Left: 9 lbs, and 3 point pinch: Right: N/T lbs, Left: 5 lbs Pt. Presents with flexor tightness in the right 5th digit, however is able to passively extend 5th digit within normal range. Pt. Also Presents with hyper extension in the 2nd and 3rd digit at PIP joints. Pt. Is able to achieve full Digit MP, PIP, and DIP PROM.  01/16/24: Grip strength: Left: 33 lbs; Lateral pinching: Left: 8 lbs; 3 point pinch: Left: 3 lbs  03/02/24: Grip strength: Left: 36 lbs; Lateral pinching: Left: 8 lbs; 3 point pinch: Left: 3 lbs  04/01/24: Grip strength: Left: (3 trials) 32 lbs, 26 lbs, 34 lbs; Lateral pinch: Pinch gauge unavailable (out for re-calibration)  COORDINATION: 9 Hole Peg test: Right: N/T sec; Left: 51 sec 01/16/24: Left: 1 min 1 sec 03/02/24:  Left: 1 min 1 sec 04/01/24: Left: 58 sec  SENSATION: Not tested  EDEMA:   MUSCLE TONE: Fluctuating flexor tone in R hand.  COGNITION: Overall cognitive status: Within functional limits for tasks assessed  VISION: Subjective report:  Baseline vision:  Visual history:   VISION ASSESSMENT:  PERCEPTION:   PRAXIS:   OBSERVATIONS: Pt. presents with  fluctuating flexor tone in the R  hand, involving the 5th digit at the PIP/DIP joint. Pt. presents with hyperextension at the 2nd and 3rd digits at the PIP joints.                                                                                                                    TREATMENT DATE: 04/15/24 Therapeutic Activity: -Facilitated LUE strengthening, pinch strengthening, and crossing midline L<>R.  1# wrist weight donned to L wrist to facilitate L shoulder strengthening; pt reached into forward and lateral planes of movement to remove velcro blocks from board.  Vc to pinch block rather than pulling from loop to remove peg from velcro board to challenge 2 point and 3 point pinch strength.  Pt placed blocks at a target on table top to the R of midline.  -In standing, practiced forward and lateral reaching patterns with the LUE to move velcro blocks on table top from R to L of midline.   Therapeutic Exercise: -L grip strengthening: Hand gripper set at moderate resistance (2 red bands) to complete grip squeezes x5 sets 15 reps; mod vc for maximizing digit flexion with each gripping rep and min A to stabilize hand gripper in L hand.  -Passive stretching to facilitate L thumb abd during observed muscle spasms   Self Care: -manual BP taken d/t lethargy: 132/81 -Review of OT goals, poc, expectation of increased participation and motivation to achieve OT goals, and balancing activity expectations between PT and OT visits to ensure maximum effort and motivation is put forth toward both disciplines.   PATIENT EDUCATION: Education details: OT goals, poc Person educated: Patient Education method: Explanation and Verbal cues Education comprehension: verbal cues required  HOME EXERCISE PROGRAM: -Pink theraputty; visual handout issued -Self and caregiver assisted PROM to R hand and wrist -Monitor splint with skin checks  GOALS: Goals reviewed with patient? Yes  SHORT TERM GOALS: Target date: 04/13/2024   Pt. Will be independent  with HEP for UE functioning. Baseline: Eval: No current HEP; 01/16/24: pt reports doing a little writing and squeezing a ball, but denies using putty; spouse stretches RUE daily 02/20/24:  Pt. Continues to do a little writing and squeezing a ball, but denies using putty; spouse stretches RUE 03/02/24: Pt. Husband assists Pt. With ROM to the right hand. Pt. reports doing hand exercises; 03/30/24: Spouse indep with HEP (spouse assists daily) Goal status: achieved  LONG TERM GOALS: Target date: 05/25/2024  Pt. Will improve [facility] skills to be able to manipulate small objects at home Baseline: 9 hole peg test: L: 51 seconds; 01/16/24: L 1 min 1 sec, 03/02/24: Left: 1 min. & 1 sec. Pt. Continues to work towards using the left to manipulate small objects; 04/01/24: L 58 sec (multiple trials to improve time) Goal status: Ongoing  2.  Pt. Will increase BUE shoulder ROM by 10 degrees to be able to reach into cabinets and shelves.  Baseline: Shoulder flexion: R: 94(121), L: 102(125), Shoulder abduction: R: 75(106), L: 110(126);  01/16/24: Shoulder flexion: R: 100 with scaption (132), L: 134 with scaption (145), Shoulder abduction: R: 74 (98), L: 140 (170)02/20/24: Pt. Is improving with functional reaching through multiple planes with cues, and assist required. 03/02/24: Shoulder flexion: R: 126(146), L: 142(148), Shoulder abduction: R: 93(120), L: WNL; 03/30/24: ROM to be updated next session; limited by time constraints with poc review, though pt reports she does not reach into cabinets; 04/01/24: Shoulder flexion: R: 100 (145), L: 140 (155); Shoulder abd: R: 50 (90), L: 165 (180); pt reports she does not reach into cabinets Goal status: Ongoing  3.  Pt. Will improve L grip strength by 5# to be able to securely grasp ADL items at home.  Baseline: Grip strength: R: NT, L: 43#; 01/16/24: L: 33#  03/02/24: 36#; 04/01/24: L: 34 lbs (best of 3 trials) Goal status: Ongoing  4.  Pt. Will increase L lateral key pinch strength  by 3#  to open wide mouth jars.  Baseline: Lateral key pinch: R: NT, L: 9#; 01/16/24: L: 8# 03/02/24: Pinch meter is out for calibration. Pt. Continues to have difficulty opening jars; 04/01/24: NT (pinch gauge not available); continued difficulty opening jars Goal status: Ongoing  5.  Pt. Will independently engage the right hand as a gross assist to the left hand 100% of the time during ADLs/IADLs. Baseline: Eval: Pt. With limited engagement of the right hand as a gross assist during tasks; 01/16/24: Observed <25% of the time during OT sessions 02/20/24: Pt. Continues to present with limited engagement of the RUE as a gross assist to the left hand approximately 25% of the time with cues. 03/02/24: Pt. Is starting to engage her right hand more as a gross assist to the left; 04/01/24: Pt engages the R hand as a fair assist to the L with intermittent vc from OT during tx sessions; pt admits she needs to use the R hand more at home. Goal status: Ongoing  6.  Pt. Will be able to manipulate zippers, and buttons efficiently with modified independence Baseline: Eval:Right: NT Left: 51 sec; 01/16/24: Left: 1 min and 1 sec 02/20/24: Pt. continues to have difficulty manipulating zippers and buttons.03/02/24: Pt. continues to have difficulty manipulating zippers, and buttons, and may benefit from reviewing adaptive devices; 04/01/24: Pt reports that she continues to avoid clothes with fasteners Goal status: Ongoing   7. Pt. Will improve handwriting legibility to 100% for one sentence in printed form for written correspondence.  Baseline: 50% legible in printed form for first name only; 01/16/24: 80-90% legible to print first name 1 of 4 attempts.  3 of 4 attempts 50% or less legible 02/20/24: first name only 100%, 25% for lists of words. 03/02/24:  first name only 100%, 25% for lists of words; 04/01/24: 100% legibility for a short 3 word sentence using L hand.  Goal status: achieved  8. Pt. Will independently, and  efficiently transfer small 1/2 objects from the left hand to the right hand in midline in preparation from reaching up  to place items at a target with 100% accuracy with the right hand. Baseline: 03/02/24: Pt. Has progressed to transferring 1.5 objects between her hand in midline, and reach up to place them at targets with 60% accuracy; 03/30/24: Pt can transfer jumbo pegs and clothespins (~1/5) from R to L hand with extra time, repeat trials, and occasional dropping. Pt continues to reach toward a target with ~60% accuracy.  Goal Status: ongoing  9. Pt. Will increased BUE strength by 2 mm  grades to assist with ADLs, and IADLs.  Baseline: 03/02/24: UE strength: Shoulder flexion: R: 3+/5 L: 4/5, abduction: R: 3-/5, L 4/5:  elbow flexion: R:4+/5, L: 5/5, extension: R: 4+/5, L:5/5, wrist flexion: R: 4-/5  L: 4/5 wrist extension: R: 4-/5 L: 4/5; 04/01/24: see chart; no significant changes  Goal Status: New: 03/02/24  ASSESSMENT: CLINICAL IMPRESSION: Pt required extensive redirection throughout seated tasks today, noting lethargy in addition to limited motivation with OT activities.  Pt was asked to stand to increase level of alertness, to which pt initially declined.  OT offered to end session if pt preferred not to stand, with pt insisting on continuing seated tasks.  OT explained that seated tasks were not proving to be sufficient to keep pt alert and maintain attention to tasks.  With further prompting, pt agreed to stand but verbalized her discontent with standing as to save her energy for PT.  OT reinforced standing tasks to be relevant to OT session, specifically to her carryover of standing ADLs in the home, and specifically important today to maximize pt's level of attention and physical effort d/t drowsiness in sitting.  Pt was then encouraged to begin ambulating toward PT gym with OT assist, to further challenge activity tolerance.  Pt adamantly declined to walk, despite OT downgrading request  to walk a few feet, or as far as she could.  Pt adamantly declined and was assisted to seated position on transport chair.  OT reinforced OT expectation of pt demonstrating a level of motivation which she holds for her PT sessions if pt expects to progress with self care tasks.  Plan to continue to challenge standing activities in future sessions, as standing today did allow improved attention to task, as well as an increased challenge that will be necessary to maximize pt's level of indep in the home.  Pt was educated that demonstrating improved motivation within OT sessions will be necessary to continue with OT poc.    PERFORMANCE DEFICITS: in functional skills including ADLs, IADLs, coordination, dexterity, edema, tone, ROM, strength, pain, Fine motor control, Gross motor control, endurance, and UE functional use, and psychosocial skills including coping strategies, environmental adaptation, habits, and routines and behaviors.   IMPAIRMENTS: are limiting patient from ADLs, IADLs, rest and sleep, leisure, and social participation.   CO-MORBIDITIES: may have co-morbidities  that affects occupational performance. Patient will benefit from skilled OT to address above impairments and improve overall function.  MODIFICATION OR ASSISTANCE TO COMPLETE EVALUATION: Min-Moderate modification of tasks or assist with assess necessary to complete an evaluation.  OT OCCUPATIONAL PROFILE AND HISTORY: Detailed assessment: Review of records and additional review of physical, cognitive, psychosocial history related to current functional performance.  CLINICAL DECISION MAKING: Moderate - several treatment options, min-mod task modification necessary  REHAB POTENTIAL: Good  EVALUATION COMPLEXITY: Moderate  PLAN:  OT FREQUENCY: 2x/week  OT DURATION: 12 weeks  PLANNED INTERVENTIONS: 97168 OT Re-evaluation, 97535 self care/ADL training, 02889 therapeutic exercise, 97530 therapeutic activity, 97112 neuromuscular  re-education, 97140 manual therapy, 97018 paraffin, 02960 fluidotherapy, 97010 moist heat, 97010 cryotherapy, 97034 contrast bath, 97032 electrical stimulation (manual), 97760 Orthotic Initial, passive range of motion, energy conservation, coping strategies training, patient/family education, and DME and/or AE instructions  RECOMMENDED OTHER SERVICES: PT   CONSULTED AND AGREED WITH PLAN OF CARE: Patient  PLAN FOR NEXT SESSION: Treatment  Inocente Blazing, MS, OTR/L

## 2024-04-15 NOTE — Therapy (Unsigned)
 OUTPATIENT PHYSICAL THERAPY TREATMENT SESSION     Patient Name: Erika Stanton MRN: [id number] DOB:March 24, 1956, 68 y.o., female Today's Date: 04/15/2024  PCP: Dr. Oneil Galloway  REFERRING PROVIDER: Dr. Oneil Galloway  END OF SESSION:   PT End of Session - 04/15/24 1451     Visit Number 31    Number of Visits 45    Date for Recertification  05/27/24    Progress Note Due on Visit 30    PT Start Time 1452    PT Stop Time 1530    PT Time Calculation (min) 38 min    Equipment Utilized During Treatment Gait belt    Activity Tolerance Patient tolerated treatment well    Behavior During Therapy WFL for tasks assessed/performed               Past Medical History:  Diagnosis Date   Anatomical narrow angle, bilateral    Aneurysm    Asthma    Cerebral aneurysm    Cerebral vasospasm    Cognitive change    Dysphagia, post-stroke    GERD (gastroesophageal reflux disease)    Hemorrhoids    History of cervical dysplasia    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 no recurrent polyps.  suggest repeat surveillance study 11/2022.     COLONOSCOPY W/ POLYPECTOMY  06/2014   Dr Elder at [facility].  3 adenomatoous polyps, anal fissure.     ESOPHAGOGASTRODUODENOSCOPY (EGD) WITH PROPOFOL  N/A 01/27/2020   Procedure: ESOPHAGOGASTRODUODENOSCOPY (EGD) WITH PROPOFOL ;  Surgeon: Shila Gustav GAILS, MD;  Location: [facility] ENDOSCOPY;  Service: Endoscopy;  Laterality: N/A;   INSERTION OF PERMANENT INTRAPERITONEAL CANNULA/CATH N/A    LAPAROSCOPIC   IR 3D INDEPENDENT WKST   01/06/2020   IR ANGIO INTRA EXTRACRAN SEL INTERNAL CAROTID BILAT MOD SED  01/06/2020   IR ANGIO VERTEBRAL SEL VERTEBRAL UNI L MOD SED  01/06/2020   IR ANGIOGRAM FOLLOW UP STUDY  01/06/2020   IR ANGIOGRAM FOLLOW UP STUDY  01/06/2020   IR ANGIOGRAM FOLLOW UP STUDY  01/06/2020   IR ANGIOGRAM FOLLOW UP STUDY  01/06/2020   IR ANGIOGRAM FOLLOW UP STUDY  01/06/2020   IR ANGIOGRAM FOLLOW UP STUDY  01/06/2020   IR ANGIOGRAM FOLLOW UP STUDY  01/06/2020   IR ANGIOGRAM FOLLOW UP STUDY  01/06/2020   IR ANGIOGRAM FOLLOW UP STUDY  01/06/2020   IR ANGIOGRAM FOLLOW UP STUDY  01/06/2020   IR NEURO EACH ADD'L AFTER BASIC UNI RIGHT (MS)  01/06/2020   IR TRANSCATH/EMBOLIZ  01/06/2020   MASTECTOMY     MOHS SURGERY N/A    RADIOLOGY WITH ANESTHESIA N/A 01/06/2020   Procedure: IR WITH ANESTHESIA FOR ANEURYSM;  Surgeon: Lanis Pupa, MD;  Location: [facility] OR;  Service: Radiology;  Laterality: N/A;   STENT SUPPORTED EMBOLIZATION OF RIGHT ICA ANEURYSM Right    TUBAL LIGATION     VENTRICULO-PERITONEAL SHUNT PLACEMENT / LAPAROSCOPIC INSERTION PERITONEAL CATHETER N/A    Patient Active Problem List   Diagnosis  Date Noted   Dyspareunia in female 04/05/2020   History of abnormal cervical Pap smear 03/10/2020   GIB (gastrointestinal bleeding) 02/28/2020   Elevated BUN    Prediabetes    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   Cerebral vasospasm    Sinus tachycardia    Dysphagia, post-stroke    Thrombocytopenia    Acute blood loss anemia    Brain aneurysm    ICH (intracerebral hemorrhage) (HCC) 01/05/2020   History of adenomatous polyp of colon 02/07/2016   Anatomical narrow angle, bilateral 12/14/2014   Fissure in ano 11/11/2014   Hemorrhoids 11/11/2014   Constipation 11/19/2013   GERD (gastroesophageal reflux disease) 03/24/2013   Irritable bowel syndrome with diarrhea 03/24/2013   Herpes zoster 05/14/2005   Abnormal Pap smear of cervix 05/15/1983   History of cervical dysplasia 05/15/1983     ONSET DATE: 12/02/2023  REFERRING DIAG: I63.9 (ICD-10-CM) - CVA (cerebral vascular accident) (HCC)   THERAPY DIAG:  Muscle weakness (generalized)  Other lack of coordination  Difficulty in walking, not elsewhere classified  Repeated falls  Other abnormalities of gait and mobility  Abnormality of gait and mobility  Rationale for Evaluation and Treatment: Rehabilitation  SUBJECTIVE:                                                                                                                                                                                             SUBJECTIVE STATEMENT:  Pt arrives to session today in an emotional state from OT session due to minor conflict with the treating occupational therapist, and not wanting to participate in the activities of that session. Reports her knees are hurting more than usual today, 5/10 on the pain scale, and that she is tired today.  PERTINENT HISTORY:  Patient reports she was in hospital from [facility]- Sat (July 21st- 25th) with CVA affecting left side. I was supposed to have a knee replacement surgery in September but that is on hold for now. I was doing my prep for my colonoscopy and fell in the bathroom.  Patient reports having mild left sided weakness compared to her CVA in 2021 with heavy Right sided UE weakness. She reports bad OA affecting both knees as well. Previous CVA in 2021 affecting Right side. Reports going to ED  last Monday due to fall/CVA affecting Left side. Diagnosed with R CVA with Left Sided weakness.    PAIN:  Are you having pain? 04/15/2024: B pain knees, 5/10  PRECAUTIONS: Fall  WEIGHT BEARING RESTRICTIONS: No  FALLS: Has patient fallen in last 6 months? Yes. Number of falls fell with the stroke  PATIENT GOALS: Walk  without any assistive device.  OBJECTIVE:  Note: Objective measures were completed at evaluation unless otherwise noted.                                                                                                                               TREATMENT DATE : 04/15/2024  153ft ambulation using SPC w/ CGA from SPT 1 min standing rest break at 151ft 151ft ambulation  2 min seated rest break  Sit<>stand x7 1 minute seated rest break  Sit<> stand x8 1 minute seated rest break 22ft ambulation to arm chair 2 minute seated break Sidestepping with 2# AW along raised black mat, 4 laps back/forth   PATIENT EDUCATION: Education details: PT plan of care; Discussion of symptoms of CVA and to go to ED as soon as possible. Purpose of PT for balance, strength, coordination and endurance.  Person educated: Patient Education method: Explanation Education comprehension: verbalized understanding  HOME EXERCISE PROGRAM: Access Code: [email or web address] URL: [email or web address] Date: 12/17/2023 Prepared by: Sidra Simpers  Program Notes **Be sure to perform all exercises next to a countertop or sturdy piece of furniture in case you become unsteady.  Exercises - Standing Heel Raise with Support  - 1 x daily - 7 x weekly - 2 sets - 15 reps - Standing Toe Raises at Chair  - 1 x daily - 7 x weekly - 2 sets - 15 reps - Mini Squat with Counter Support  - 1 x daily - 7 x weekly - 2 sets - 15 reps - Lunge with Counter Support  - 1 x daily - 7 x weekly - 2 sets - 15 reps - Standing Tandem Balance with Counter Support  - 1 x daily - 7 x weekly - 2 sets - 2 reps - 30 hold - Standing Single Leg Stance with Counter Support  - 1 x daily - 7 x weekly - 2 sets - 2 reps - 30 hold  GOALS: Goals reviewed with patient? Yes  SHORT TERM GOALS: Target date: 01/21/2024  Pt will be independent with HEP in order to improve strength and balance in order to decrease fall risk and improve function at home.  Baseline: EVAL - No current formal HEP in place 03/04/2024- Patient reports independent with all current HEP and riding a stationary bike.  Goal status: MET   LONG TERM GOALS: Target date:  05/27/2024  1.  Patient will complete five times sit to stand test in < 15 seconds indicating an increased LE strength and improved balance. Baseline: EVAL= 25.20 sec with LUE Support 9/2: 23.36 without UE support and CGA-min assist. 20.10 sec with LUE support 03/04/2024- more difficulty raising without UE but performed 18.75 sec with LUE Support 04/06/24: 21.99s with L UE support on chair rail Goal status: IN PROGRESS   2.  Patient will increase Berg Balance score by > 6 points to demonstrate decreased fall risk during functional activities. Baseline: EVAL: To be assessed next visit;  12/12/2023= 42/56 9/2: 44/56  10/16:45/56 04/06/2024: test at next session Goal status: PROGRESSING   3.  Patient will reduce timed up and go to <11 seconds to reduce fall risk and demonstrate improved transfer/gait ability. Baseline: EVAL = 40 sec with RW 9/2: 18.33sec with SPC 10/16:17.47 sec with SPC 04/06/24: 20.17s w/ SPC Goal status: PROGRESSING  4.  Patient will increase 10 meter walk test to >1.71m/s as to improve gait speed for better community ambulation and to reduce fall risk. Baseline: EVAL: 0.65 m/s 9/2: 0.67 m/s with [facility] 9/9: 0.71 m/s without an AD and wearing 2.5 # AW 10/16: .75 m/s with [facility] 04/06/24: 0.51 m/s Goal status: PROGRESSING  5. Patient will increase six minute walk test distance by 200 feet for progression to community ambulator and improve gait ability Baseline: EVAL= 482' 9/2: 465 feet using a SPC with CGA required seated rest break at 4:65min   10/16: 542 ft and full 6 minutes  04/06/24: 525 ft using SPC with CGA, 3 rest breaks taken throughout, @ 2:31 min, 3:70min, and 5:56min Goal status: PROGRESSING   ASSESSMENT:  CLINICAL IMPRESSION:   Pt was agreeable for plan today to hold off on redoing goal assessment tasks due to emotional state and reported increased LE pain and fatigue today. Focused today's session on upright movement/ambulation and continuing to minimize  self-limiting behavior and time of rest breaks. SPT utilized timer for rest breaks, allowing 1-2 minutes between activities to encourage pt to continue activity. Pt still showing self-limiting behavior and requesting continual breaks with activities. CGA utilized by SPT for all activities in today's session. Pt shows improved form and ability during sit to stands, but in limited in repetitions due to knee pain. Pt would benefit from increasing tolerance to ambulation and increasing L LE strength. Pt will continue to benefit from skilled therapy to address remaining deficits in order to improve overall QoL and return to PLOF.   OBJECTIVE IMPAIRMENTS: Abnormal gait, decreased activity tolerance, decreased balance, decreased coordination, decreased endurance, decreased mobility, difficulty walking, decreased ROM, decreased strength, impaired UE functional use, and pain.   ACTIVITY LIMITATIONS: carrying, lifting, bending, sitting, standing, squatting, sleeping, stairs, and transfers  PARTICIPATION LIMITATIONS: meal prep, cleaning, laundry, driving, shopping, community activity, and yard work  PERSONAL FACTORS: 1-2 comorbidities: OA, previous CVA with significant R sided weakness are also affecting patient's functional outcome.   REHAB POTENTIAL: Good  CLINICAL DECISION MAKING: Stable/uncomplicated  EVALUATION COMPLEXITY: Moderate  PLAN:  PT FREQUENCY: 1-2x/week  PT DURATION: 12 weeks  PLANNED INTERVENTIONS: 97164- PT Re-evaluation, 97750- Physical Performance Testing, 97110-Therapeutic exercises, 97530- Therapeutic activity, [id number]- Neuromuscular re-education, 97535- Self Care, [id number]- Manual therapy, [id number]- Gait training, [phone]- Orthotic Initial, [phone]- Orthotic/Prosthetic subsequent, [phone]- Canalith repositioning, [phone]- Electrical stimulation (manual), [phone] (1-2 muscles), [id number] (3+ muscles)- Dry Needling, Patient/Family education, Balance training, Stair training, Taping, Joint mobilization,  Joint manipulation, Spinal manipulation, Spinal mobilization, Compression bandaging, Vestibular training, DME instructions, Cryotherapy, and Moist heat  PLAN FOR NEXT SESSION:  -reassess all outcome measures for goal update*** -continue to work on improvement of ambulation distance tolerance in hospital hallways to decrease self-limiting behavior -decrease duration of rest breaks -step ups onto stairs, focus on L LE -continue BLE functional strengthening activities  Renna Helling, SPT  04/15/24, 5:12 PM

## 2024-04-16 ENCOUNTER — Ambulatory Visit

## 2024-04-16 ENCOUNTER — Ambulatory Visit: Admitting: Physical Therapy

## 2024-04-16 DIAGNOSIS — R296 Repeated falls: Secondary | ICD-10-CM

## 2024-04-16 DIAGNOSIS — R269 Unspecified abnormalities of gait and mobility: Secondary | ICD-10-CM

## 2024-04-16 DIAGNOSIS — M6281 Muscle weakness (generalized): Secondary | ICD-10-CM

## 2024-04-16 DIAGNOSIS — R262 Difficulty in walking, not elsewhere classified: Secondary | ICD-10-CM

## 2024-04-16 DIAGNOSIS — R2689 Other abnormalities of gait and mobility: Secondary | ICD-10-CM

## 2024-04-16 DIAGNOSIS — R278 Other lack of coordination: Secondary | ICD-10-CM

## 2024-04-16 NOTE — Therapy (Signed)
 OUTPATIENT PHYSICAL THERAPY TREATMENT SESSION     Patient Name: Erika Stanton MRN: [id number] DOB:Oct 02, 1955, 68 y.o., female Today's Date: 04/16/2024  PCP: Dr. Oneil Galloway  REFERRING PROVIDER: Dr. Oneil Galloway  END OF SESSION:   PT End of Session - 04/16/24 1032     Visit Number 32    Number of Visits 45    Date for Recertification  05/27/24    Progress Note Due on Visit 40    PT Start Time 1100    PT Stop Time 1142    PT Time Calculation (min) 42 min    Equipment Utilized During Treatment Gait belt    Activity Tolerance Patient tolerated treatment well    Behavior During Therapy WFL for tasks assessed/performed               Past Medical History:  Diagnosis Date   Anatomical narrow angle, bilateral    Aneurysm    Asthma    Cerebral aneurysm    Cerebral vasospasm    Cognitive change    Dysphagia, post-stroke    GERD (gastroesophageal reflux disease)    Hemorrhoids    History of cervical dysplasia    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 no recurrent polyps.  suggest repeat surveillance study 11/2022.     COLONOSCOPY W/ POLYPECTOMY  06/2014   Dr Elder at [facility].  3 adenomatoous polyps, anal fissure.     ESOPHAGOGASTRODUODENOSCOPY (EGD) WITH PROPOFOL  N/A 01/27/2020   Procedure: ESOPHAGOGASTRODUODENOSCOPY (EGD) WITH PROPOFOL ;  Surgeon: Shila Gustav GAILS, MD;  Location: [facility] END[facility]COPY;  Service: Endoscopy;  Laterality: N/A;   INSERTION OF PERMANENT INTRAPERITONEAL CANNULA/CATH N/A    LAPAROSCOPIC   IR 3D INDEPENDENT WKST   01/06/2020   IR ANGIO INTRA EXTRACRAN SEL INTERNAL CAROTID BILAT MOD SED  01/06/2020   IR ANGIO VERTEBRAL SEL VERTEBRAL UNI L MOD SED  01/06/2020   IR ANGIOGRAM FOLLOW UP STUDY  01/06/2020   IR ANGIOGRAM FOLLOW UP STUDY  01/06/2020   IR ANGIOGRAM FOLLOW UP STUDY  01/06/2020   IR ANGIOGRAM FOLLOW UP STUDY  01/06/2020   IR ANGIOGRAM FOLLOW UP STUDY  01/06/2020   IR ANGIOGRAM FOLLOW UP STUDY  01/06/2020   IR ANGIOGRAM FOLLOW UP STUDY  01/06/2020   IR ANGIOGRAM FOLLOW UP STUDY  01/06/2020   IR ANGIOGRAM FOLLOW UP STUDY  01/06/2020   IR ANGIOGRAM FOLLOW UP STUDY  01/06/2020   IR NEURO EACH ADD'L AFTER BASIC UNI RIGHT (MS)  01/06/2020   IR TRANSCATH/EMBOLIZ  01/06/2020   MASTECTOMY     MOHS SURGERY N/A    RADIOLOGY WITH ANESTHESIA N/A 01/06/2020   Procedure: IR WITH ANESTHESIA FOR ANEURYSM;  Surgeon: Lanis Pupa, MD;  Location: [facility] OR;  Service: Radiology;  Laterality: N/A;   STENT SUPPORTED EMBOLIZATION OF RIGHT ICA ANEURYSM Right    TUBAL LIGATION     VENTRICULO-PERITONEAL SHUNT PLACEMENT / LAPAROSCOPIC INSERTION PERITONEAL CATHETER N/A    Patient Active Problem List   Diagnosis  Date Noted   Dyspareunia in female 04/05/2020   History of abnormal cervical Pap smear 03/10/2020   GIB (gastrointestinal bleeding) 02/28/2020   Elevated BUN    Prediabetes    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   Cerebral vasospasm    Sinus tachycardia    Dysphagia, post-stroke    Thrombocytopenia    Acute blood loss anemia    Brain aneurysm    ICH (intracerebral hemorrhage) (HCC) 01/05/2020   History of adenomatous polyp of colon 02/07/2016   Anatomical narrow angle, bilateral 12/14/2014   Fissure in ano 11/11/2014   Hemorrhoids 11/11/2014   Constipation 11/19/2013   GERD (gastroesophageal reflux disease) 03/24/2013   Irritable bowel syndrome with diarrhea 03/24/2013   Herpes zoster 05/14/2005   Abnormal Pap smear of cervix 05/15/1983   History of cervical dysplasia 05/15/1983     ONSET DATE: 12/02/2023  REFERRING DIAG: I63.9 (ICD-10-CM) - CVA (cerebral vascular accident) (HCC)   THERAPY DIAG:  Muscle weakness (generalized)  Difficulty in walking, not elsewhere classified  Repeated falls  Other abnormalities of gait and mobility  Abnormality of gait and mobility  Rationale for Evaluation and Treatment: Rehabilitation  SUBJECTIVE:                                                                                                                                                                                             SUBJECTIVE STATEMENT:   Pt reports doing well today with no changes since appointment yesterday. Pt has birthday this weekend and is going out to several restaurants to celebrate. Does not consider this anything special though.   PERTINENT HISTORY:   Patient reports she was in hospital from [facility]- Sat (July 21st- 25th) with CVA affecting left side. I was supposed to have a knee replacement surgery in September but that is on hold for now. I was doing my prep for my colonoscopy and fell in the bathroom.  Patient reports having mild left sided weakness compared to her CVA in 2021 with heavy Right sided UE weakness. She reports bad OA affecting both knees as well. Previous CVA in 2021 affecting Right side. Reports going to ED  last Monday due to fall/CVA affecting Left side. Diagnosed with R CVA with Left Sided weakness.    PAIN:  Are you having pain? 04/16/2024: B pain knees, 5/10  PRECAUTIONS: Fall  WEIGHT BEARING RESTRICTIONS: No  FALLS: Has patient fallen in last 6 months? Yes. Number of falls fell with the stroke  PATIENT GOALS: Walk without any assistive device.  OBJECTIVE:  Note: Objective measures were completed at evaluation unless otherwise noted.  TREATMENT DATE : 04/16/2024  PT instructed pt in TUG: 20.86  sec with SPC 16.58 sec with no AD  ( >13.5 sec indicates increased fall risk)  10 Meter Walk Test: Patient instructed to walk 10 meters (32.8 ft) as quickly and as safely as possible at their normal speed Results: 12/4: .675 m/s with SPC, .70 m/s no AD  Cut off scores:   Household Ambulator  < 0.4 m/s  Limited Community Ambulator  0.4 - 0.8 m/s  [employer]  > 0.8 m/s  Increased fall risk  < 1.57m/s  Crossing a Street  >1.48m/s  MCID 0.05 m/s (small), 0.13 m/s (moderate), 0.06 m/s (significant)  (ANPTA Core Set of Outcome Measures for Adults with Neurologic Conditions, 2018)    Five times Sit to Stand Test (FTSS)  TIME: 16.59 sec with L UE assist   Cut off scores indicative of increased fall risk: >12 sec CVA, >16 sec PD, >13 sec vestibular (ANPTA Core Set of Outcome Measures for Adults with Neurologic Conditions, 2018)   [facility] PT Assessment - 04/16/24 0001       Berg Balance Test   Sit to Stand Able to stand without using hands and stabilize independently    Standing Unsupported Able to stand safely 2 minutes    Sitting with Back Unsupported but Feet Supported on Floor or Stool Able to sit safely and securely 2 minutes    Stand to Sit Sits safely with minimal use of hands    Transfers Able to transfer safely, definite need of hands    Standing Unsupported with Eyes Closed Able to stand 10 seconds safely    Standing Unsupported with Feet Together Able to place feet together independently and stand 1 minute safely    From Standing, Reach Forward with Outstretched Arm Can reach confidently >25 cm (10)    From Standing Position, Pick up Object from Floor Able to pick up shoe safely and easily    From Standing Position, Turn to Look Behind Over each Shoulder Looks behind one side only/other side shows less weight shift    Turn 360 Degrees Able to turn 360 degrees safely but slowly    Standing Unsupported, One Foot in Front Able to plae foot ahead of the other  independently and hold 30 seconds          Patient demonstrates increased fall risk as noted by score of  47 /56 on Berg Balance Scale.  (<36= high risk for falls, close to 100%; 37-45 significant >80%; 46-51 moderate >50%; 52-55 lower >25%)  Unless otherwise stated, CGA was provided and gait belt donned in order to ensure pt safety  Pt required occasional rest breaks due fatigue, PT was attentive to when pt appeared to be tired or winded in order to prevent excessive fatigue.  PATIENT EDUCATION: Education details: PT plan of care; Discussion of symptoms of CVA and to go to ED as soon as possible. Purpose of PT for balance, strength, coordination and endurance.  Person educated: Patient Education method: Explanation Education comprehension: verbalized understanding  HOME EXERCISE PROGRAM: Access Code: [email or web address] URL: [email or web address] Date: 12/17/2023 Prepared by: Sidra Simpers  Program Notes **Be sure to perform all exercises next to a countertop or sturdy piece of furniture in case you become unsteady.  Exercises - Standing Heel Raise with Support  - 1 x daily - 7 x weekly - 2 sets - 15 reps - Standing Toe Raises at Chair  - 1 x daily - 7 x weekly -  2 sets - 15 reps - Mini Squat with Counter Support  - 1 x daily - 7 x weekly - 2 sets - 15 reps - Lunge with Counter Support  - 1 x daily - 7 x weekly - 2 sets - 15 reps - Standing Tandem Balance with Counter Support  - 1 x daily - 7 x weekly - 2 sets - 2 reps - 30 hold - Standing Single Leg Stance with Counter Support  - 1 x daily - 7 x weekly - 2 sets - 2 reps - 30 hold  GOALS: Goals reviewed with patient? Yes  SHORT TERM GOALS: Target date: 01/21/2024  Pt will be independent with HEP in order to improve strength and balance in order to decrease fall risk and improve function at home.  Baseline: EVAL - No current formal HEP in place 03/04/2024- Patient reports independent with all current HEP and riding a  stationary bike.  Goal status: MET   LONG TERM GOALS: Target date: 05/27/2024  1.  Patient will complete five times sit to stand test in < 15 seconds indicating an increased LE strength and improved balance. Baseline: EVAL= 25.20 sec with LUE Support 9/2: 23.36 without UE support and CGA-min assist. 20.10 sec with LUE support 03/04/2024- more difficulty raising without UE but performed 18.75 sec with LUE Support 04/06/24: 21.99s with L UE support on chair rail 12/4:16.59 sec with L UE assist   Goal status: IN PROGRESS   2.  Patient will increase Berg Balance score by > 6 points to demonstrate decreased fall risk during functional activities. Baseline: EVAL: To be assessed next visit; 12/12/2023= 42/56 9/2: 44/56  10/16:45/56 04/06/2024: test at next session 12/4: 47 Goal status: PROGRESSING   3.  Patient will reduce timed up and go to <11 seconds to reduce fall risk and demonstrate improved transfer/gait ability. Baseline: EVAL = 40 sec with RW 9/2: 18.33sec with SPC 10/16:17.47 sec with SPC 04/06/24: 20.17s w/ SPC 12/4:20.86 sec with SPC 16.58 sec with no AD  Goal status: PROGRESSING  4.  Patient will increase 10 meter walk test to >1.66m/s as to improve gait speed for better community ambulation and to reduce fall risk. Baseline: EVAL: 0.65 m/s 9/2: 0.67 m/s with [facility] 9/9: 0.71 m/s without an AD and wearing 2.5 # AW 10/16: .75 m/s with [facility] 04/06/24: 0.51 m/s 12/4: .675 m/s with SPC, .70 m/s no AD  Goal status: PROGRESSING  5. Patient will increase six minute walk test distance by 200 feet for progression to community ambulator and improve gait ability Baseline: EVAL= 482' 9/2: 465 feet using a SPC with CGA required seated rest break at 4:15min   10/16: 542 ft and full 6 minutes  04/06/24: 525 ft using SPC with CGA, 3 rest breaks taken throughout, @ 2:31 min, 3:48min, and 5:64min 12/4:625 ft total mixed SPC and no SPC initial 300 ft no AD only one standing rest break  allowed around 400 ft, encouraged pt to not rest during test.  Goal status: PROGRESS    ASSESSMENT:  CLINICAL IMPRESSION:   Pt present for goal assessment this date. Pt showing adequate progress towards her long term goals. Pt makes significant progress with , performing about half of distance without AD and only requiring one standing rest break. Pt did have to be encouraged she could coplete this as her confidence in this ability was very low. Pt also shows good progress toward BERG balance and TUG goals, notably completing TUG in better time without  AD compared to with [facility]. Encouraged pt to complete limited bouts of ambulation around home without AD as she feels confident, like to bathroom or in limited space around kitchen. Pt gait speed with better since last session but not as fast as she has completed previously. Pt confidence and seems to be a limiting factor.  Pt will continue to benefit from skilled therapy to address remaining deficits in order to improve overall QoL and return to PLOF.   OBJECTIVE IMPAIRMENTS: Abnormal gait, decreased activity tolerance, decreased balance, decreased coordination, decreased endurance, decreased mobility, difficulty walking, decreased ROM, decreased strength, impaired UE functional use, and pain.   ACTIVITY LIMITATIONS: carrying, lifting, bending, sitting, standing, squatting, sleeping, stairs, and transfers  PARTICIPATION LIMITATIONS: meal prep, cleaning, laundry, driving, shopping, community activity, and yard work  PERSONAL FACTORS: 1-2 comorbidities: OA, previous CVA with significant R sided weakness are also affecting patient's functional outcome.   REHAB POTENTIAL: Good  CLINICAL DECISION MAKING: Stable/uncomplicated  EVALUATION COMPLEXITY: Moderate  PLAN:  PT FREQUENCY: 1-2x/week  PT DURATION: 12 weeks  PLANNED INTERVENTIONS: [id number]- PT Re-evaluation, 97750- Physical Performance Testing, 97110-Therapeutic exercises, 97530-  Therapeutic activity, [id number]- Neuromuscular re-education, 97535- Self Care, [id number]- Manual therapy, [id number]- Gait training, [id number]- Orthotic Initial, [id number]- Orthotic/Prosthetic subsequent, [phone]- Canalith repositioning, [id number]- Electrical stimulation (manual), [phone] (1-2 muscles), [id number] (3+ muscles)- Dry Needling, Patient/Family education, Balance training, Stair training, Taping, Joint mobilization, Joint manipulation, Spinal manipulation, Spinal mobilization, Compression bandaging, Vestibular training, DME instructions, Cryotherapy, and Moist heat  PLAN FOR NEXT SESSION:  -walking for 6 min with only 1 rest break.. or fewer  - remove AD with some gait as indicated -continue to work on improvement of ambulation distance tolerance in hospital hallways to decrease self-limiting behavior -decrease duration of rest breaks -step ups onto stairs, focus on L LE -continue BLE functional strengthening activities  Note: Portions of this document were prepared using Dragon voice recognition software and although reviewed may contain unintentional dictation errors in syntax, grammar, or spelling.  Lonni KATHEE Gainer PT ,DPT Physical Therapist- [facility]   04/16/24, 11:27 AM

## 2024-04-19 NOTE — Therapy (Signed)
 Outpatient Occupational Therapy Neuro Treatment Note  Patient Name: Erika Stanton MRN: [id number] DOB:10-06-1955, 68 y.o., female Today's Date: 04/19/2024  PCP: Laurice Anes, MD REFERRING PROVIDER: Laurice Anes, MD   OT End of Session - 04/19/24 1912     Visit Number 34    Number of Visits 48    Date for Recertification  05/25/24    Authorization Time Period Reporting period beginning 03/30/24    OT Start Time 1015    OT Stop Time 1100    OT Time Calculation (min) 45 min    Equipment Utilized During Treatment transport chair, SPC    Activity Tolerance Patient tolerated treatment well    Behavior During Therapy WFL for tasks assessed/performed         Past Medical History:  Diagnosis Date   Anatomical narrow angle, bilateral    Aneurysm    Asthma    Cerebral aneurysm    Cerebral vasospasm    Cognitive change    Dysphagia, post-stroke    GERD (gastroesophageal reflux disease)    Hemorrhoids    History of cervical dysplasia    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 no recurrent polyps.  suggest repeat surveillance study 11/2022.     COLONOSCOPY W/ POLYPECTOMY  06/2014   Dr Elder at [facility].  3 adenomatoous polyps, anal fissure.     ESOPHAGOGASTRODUODENOSCOPY (EGD) WITH PROPOFOL  N/A 01/27/2020   Procedure: ESOPHAGOGASTRODUODENOSCOPY (EGD) WITH PROPOFOL ;  Surgeon: Shila Gustav GAILS, MD;  Location: [facility];  Service: Endoscopy;  Laterality: N/A;   INSERTION OF PERMANENT INTRAPERITONEAL CANNULA/CATH N/A    LAPAROSCOPIC   IR 3D  INDEPENDENT WKST  01/06/2020   IR ANGIO INTRA EXTRACRAN SEL INTERNAL CAROTID BILAT MOD SED  01/06/2020   IR ANGIO VERTEBRAL SEL VERTEBRAL UNI L MOD SED  01/06/2020   IR ANGIOGRAM FOLLOW UP STUDY  01/06/2020   IR ANGIOGRAM FOLLOW UP STUDY  01/06/2020   IR ANGIOGRAM FOLLOW UP STUDY  01/06/2020   IR ANGIOGRAM FOLLOW UP STUDY  01/06/2020   IR ANGIOGRAM FOLLOW UP STUDY  01/06/2020   IR ANGIOGRAM FOLLOW UP STUDY  01/06/2020   IR ANGIOGRAM FOLLOW UP STUDY  01/06/2020   IR ANGIOGRAM FOLLOW UP STUDY  01/06/2020   IR ANGIOGRAM FOLLOW UP STUDY  01/06/2020   IR ANGIOGRAM FOLLOW UP STUDY  01/06/2020   IR NEURO EACH ADD'L AFTER BASIC UNI RIGHT (MS)  01/06/2020   IR TRANSCATH/EMBOLIZ  01/06/2020   MASTECTOMY     MOHS SURGERY N/A    RADIOLOGY WITH ANESTHESIA N/A 01/06/2020   Procedure: IR WITH ANESTHESIA FOR ANEURYSM;  Surgeon: Lanis Pupa, MD;  Location: [facility] OR;  Service: Radiology;  Laterality: N/A;   STENT SUPPORTED EMBOLIZATION OF RIGHT ICA ANEURYSM Right    TUBAL LIGATION     VENTRICULO-PERITONEAL SHUNT PLACEMENT / LAPAROSCOPIC INSERTION PERITONEAL CATHETER N/A    Patient Active Problem List   Diagnosis Date Noted   Dyspareunia in female 04/05/2020  History of abnormal cervical Pap smear 03/10/2020   GIB (gastrointestinal bleeding) 02/28/2020   Elevated BUN    Prediabetes    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   Cerebral vasospasm    Sinus tachycardia    Dysphagia, post-stroke    Thrombocytopenia    Acute blood loss anemia    Brain aneurysm    ICH (intracerebral hemorrhage) (HCC) 01/05/2020   History of adenomatous polyp of colon 02/07/2016   Anatomical narrow angle, bilateral 12/14/2014   Fissure in ano 11/11/2014   Hemorrhoids 11/11/2014   Constipation 11/19/2013   GERD (gastroesophageal reflux disease) 03/24/2013   Irritable bowel syndrome with diarrhea 03/24/2013   Herpes zoster 05/14/2005   Abnormal Pap smear of cervix 05/15/1983   History of cervical dysplasia  05/15/1983   ONSET DATE: 12/02/2023  REFERRING DIAG: L ischemic CVA  THERAPY DIAG:  Muscle weakness (generalized)  Other lack of coordination  Rationale for Evaluation and Treatment: Rehabilitation  SUBJECTIVE:  SUBJECTIVE STATEMENT: Pt reports being ready and motivated to participate in OT, and agreeable to tx activities discussed between pt and OT beginning of session.   PERTINENT HISTORY:  Pt. was admitted to [facility] from 12/02/23-12/06/23 with an Acute Right MCA CVA, Hydrocephalus with shunt  placement. History of Subarachnoid Hemorrhage with bilateral ICA coiled aneurysms. 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  PRECAUTIONS: None  WEIGHT BEARING RESTRICTIONS: No  PAIN: 04/16/24: No UE pain, 3-4/10 in knees Are you having pain? No  FALLS: Has patient fallen in last 6 months? Yes. Number of falls 1  LIVING ENVIRONMENT: Lives with: lives with their family and lives with their spouse Lives in: House/apartment Stairs: hand rails, 2 steps   Has following equipment at home: [name], cane  PLOF: Independent with basic ADLs, Spouse help as needed  PATIENT GOALS: Strengthening  OBJECTIVE:  Note: Objective measures were completed at Evaluation unless otherwise noted.  HAND DOMINANCE: Left  ADLs: Overall ADLs: spouse assists when needed, Pt. reports that she can do most tasks herself. Uses her left hand to engage in self-care tasks. Eating: Able to cut foods with a fork, husband assists as needed.  Grooming: husband assists with applying toothpaste, able to brush teeth and hair UB Dressing: Spouse assists with applying/fastening bra, Pt. Requires assistance with zippers, and buttons. LB Dressing: Spouse  assists with tying shoes when needed, uses slip on shoes. Pt. Does not zip pants. Toileting: Able to complete independently Bathing: Pt. Requires intermittent assistance with bathing tasks. Tub Shower transfers: Spouse assists with transfers when/if needed Equipment: Shower seat with back  IADLs: Shopping: Spouse typically does the shopping, isnt able to carry heavy objects.  Light housekeeping: Spouse typically does most of the cleaning around the home, Pt. Cleans bathroom, and laundry Pt. Is unable to carry laundry due to exacerbation of weakness.  Meal Prep: Spouse typically cooks and prepares meals.  Community mobility: Pt. Requires assistance using walker. Pt. reports that she is unable to carry heavy items, and typically does not do the shopping.  Medication management: Pt. is able to take care of meds with pillbox Financial management:  Spouse takes care of it, and makes online payments.  Handwriting: 50% legible  MOBILITY STATUS: Needs Assist: Pt. Requires use of a walker   POSTURE COMMENTS:  No Significant postural limitations Sitting balance: Good  ACTIVITY TOLERANCE: Activity tolerance:   FUNCTIONAL OUTCOME MEASURES: TBD  UPPER EXTREMITY ROM:    Active ROM Right eval Right 01/16/24 Right 03/02/24 Right 04/01/24  Left eval Left 01/16/24 Left 03/02/24 Left 04/01/24  Shoulder flexion 94(121) 100 with scaption (132) 126(146) 100 some scaption (145) 102(125) 134 with scaption (145) 142(148) 140 (155)  Shoulder abduction 75(106) 74 (98) 93(120) 58(90) 110(126) 140 (170) WNL 165 (180)  Shoulder adduction          Shoulder extension          Shoulder internal rotation          Shoulder external rotation          Elbow flexion 135(155)  150(155)  140(150)  150   Elbow extension 0  0  -10(-9)  -4(0)   Wrist flexion 40(44)  48(60)  42(58)  52(60)   Wrist extension 30(34)  48(60)  40(50)  52(56)   Wrist ulnar deviation          Wrist radial deviation          Wrist  pronation          Wrist supination          (Blank rows = not tested)  UPPER EXTREMITY MMT:     MMT Right eval Right 03/02/24 Right 04/01/24 Left eval Left 03/02/24 Left 04/01/24  Shoulder flexion 3-/5 3+/5 3- 3-/5 4/5 4/5  Shoulder abduction 3-/5 3-/5 3- 3-/5 4/5 4/5  Shoulder adduction        Shoulder extension        Shoulder internal rotation        Shoulder external rotation        Middle trapezius        Lower trapezius        Elbow flexion 4-/5 4+/5 4+ 4+/5 5/5 5/5  Elbow extension 4-/5 4+/5 4/5 4+/5 5/5 5/5  Wrist flexion 4-/5 4-/5 4/5 3+/5 4/5 4+/5  Wrist extension 4-/5 4-/5 4- 3+/5 4/5 4+/5  Wrist ulnar deviation        Wrist radial deviation        Wrist pronation        Wrist supination        (Blank rows = not tested)  HAND FUNCTION Eval:  Grip strength: Right: N/T lbs; Left: 43 lbs, Lateral pinch: Right: N/T lbs, Left: 9 lbs, and 3 point pinch: Right: N/T lbs, Left: 5 lbs Pt. Presents with flexor tightness in the right 5th digit, however is able to passively extend 5th digit within normal range. Pt. Also Presents with hyper extension in the 2nd and 3rd digit at PIP joints. Pt. Is able to achieve full Digit MP, PIP, and DIP PROM.  01/16/24: Grip strength: Left: 33 lbs; Lateral pinching: Left: 8 lbs; 3 point pinch: Left: 3 lbs  03/02/24: Grip strength: Left: 36 lbs; Lateral pinching: Left: 8 lbs; 3 point pinch: Left: 3 lbs  04/01/24: Grip strength: Left: (3 trials) 32 lbs, 26 lbs, 34 lbs; Lateral pinch: Pinch gauge unavailable (out for re-calibration)  COORDINATION: 9 Hole Peg test: Right: N/T sec; Left: 51 sec 01/16/24: Left: 1 min 1 sec 03/02/24:  Left: 1 min 1 sec 04/01/24: Left: 58 sec  SENSATION: Not tested  EDEMA:   MUSCLE TONE: Fluctuating flexor tone in R hand.  COGNITION: Overall cognitive status: Within functional limits for tasks assessed  VISION: Subjective report:  Baseline vision:  Visual history:  VISION  ASSESSMENT:  PERCEPTION:   PRAXIS:   OBSERVATIONS: Pt. presents with fluctuating flexor tone in the R hand, involving the 5th digit at the PIP/DIP joint. Pt. presents with hyperextension at the 2nd and 3rd digits at the PIP joints.                                                                                                                    TREATMENT DATE: 04/16/24 Self Care: -Participation in handwriting tasks to sign/print name for receipts and other official documents using L hand, standard pen, and lined paper.  OT provided vc/tc and visual demo for pen set up in L hand; repeat cues needed when pen was set down between trials.   -Recommended positioning top part of paper with a R sided tilt with pt using L hand, and provided mod vc for stabilizing note pad with R hand/forearm.   -Positioned pt without lateral support on mat table to challenge core stability and promote upright posture for handwriting tasks.  Feet supported on floor while pt used side table for handwriting.     -Practiced zipping/unzipping purse and removing desired items from wallet and purse with min vc for using R hand as a stabilizer.  Trialed variety of positioning with purse strap over shoulder vs around neck, purse in lap vs hanging between legs to widen purse opening for easier access.    PATIENT EDUCATION: Education details: handwriting strategies and positioning to remove and return items from purse and wallet Person educated: Patient Education method: Explanation and Verbal cues Education comprehension: verbal cues required  HOME EXERCISE PROGRAM: -Pink theraputty; visual handout issued -Self and caregiver assisted PROM to R hand and wrist -Monitor splint with skin checks  GOALS: Goals reviewed with patient? Yes  SHORT TERM GOALS: Target date: 04/13/2024   Pt. Will be independent with HEP for UE functioning. Baseline: Eval: No current HEP; 01/16/24: pt reports doing a little writing and squeezing a  ball, but denies using putty; spouse stretches RUE daily 02/20/24:  Pt. Continues to do a little writing and squeezing a ball, but denies using putty; spouse stretches RUE 03/02/24: Pt. Husband assists Pt. With ROM to the right hand. Pt. reports doing hand exercises; 03/30/24: Spouse indep with HEP (spouse assists daily) Goal status: achieved  LONG TERM GOALS: Target date: 05/25/2024  Pt. Will improve [facility] skills to be able to manipulate small objects at home Baseline: 9 hole peg test: L: 51 seconds; 01/16/24: L 1 min 1 sec, 03/02/24: Left: 1 min. & 1 sec. Pt. Continues to work towards using the left to manipulate small objects; 04/01/24: L 58 sec (multiple trials to improve time) Goal status: Ongoing  2.  Pt. Will increase BUE shoulder ROM by 10 degrees to be able to reach into cabinets and shelves.  Baseline: Shoulder flexion: R: 94(121), L: 102(125), Shoulder abduction: R: 75(106), L: 110(126); 01/16/24: Shoulder flexion: R: 100 with scaption (132), L: 134 with scaption (145), Shoulder abduction: R: 74 (98), L: 140 (170)02/20/24:  Pt. Is improving with functional reaching through multiple planes with cues, and assist required. 03/02/24: Shoulder flexion: R: 126(146), L: 142(148), Shoulder abduction: R: 93(120), L: WNL; 03/30/24: ROM to be updated next session; limited by time constraints with poc review, though pt reports she does not reach into cabinets; 04/01/24: Shoulder flexion: R: 100 (145), L: 140 (155); Shoulder abd: R: 50 (90), L: 165 (180); pt reports she does not reach into cabinets Goal status: Ongoing  3.  Pt. Will improve L grip strength by 5# to be able to securely grasp ADL items at home.  Baseline: Grip strength: R: NT, L: 43#; 01/16/24: L: 33#  03/02/24: 36#; 04/01/24: L: 34 lbs (best of 3 trials) Goal status: Ongoing  4.  Pt. Will increase L lateral key pinch strength by 3#  to open wide mouth jars.  Baseline: Lateral key pinch: R: NT, L: 9#; 01/16/24: L: 8# 03/02/24: Pinch meter is out  for calibration. Pt. Continues to have difficulty opening jars; 04/01/24: NT (pinch gauge not available); continued difficulty opening jars Goal status: Ongoing  5.  Pt. Will independently engage the right hand as a gross assist to the left hand 100% of the time during ADLs/IADLs. Baseline: Eval: Pt. With limited engagement of the right hand as a gross assist during tasks; 01/16/24: Observed <25% of the time during OT sessions 02/20/24: Pt. Continues to present with limited engagement of the RUE as a gross assist to the left hand approximately 25% of the time with cues. 03/02/24: Pt. Is starting to engage her right hand more as a gross assist to the left; 04/01/24: Pt engages the R hand as a fair assist to the L with intermittent vc from OT during tx sessions; pt admits she needs to use the R hand more at home. Goal status: Ongoing  6.  Pt. Will be able to manipulate zippers, and buttons efficiently with modified independence Baseline: Eval:Right: NT Left: 51 sec; 01/16/24: Left: 1 min and 1 sec 02/20/24: Pt. continues to have difficulty manipulating zippers and buttons.03/02/24: Pt. continues to have difficulty manipulating zippers, and buttons, and may benefit from reviewing adaptive devices; 04/01/24: Pt reports that she continues to avoid clothes with fasteners Goal status: Ongoing   7. Pt. Will improve handwriting legibility to 100% for one sentence in printed form for written correspondence.  Baseline: 50% legible in printed form for first name only; 01/16/24: 80-90% legible to print first name 1 of 4 attempts.  3 of 4 attempts 50% or less legible 02/20/24: first name only 100%, 25% for lists of words. 03/02/24:  first name only 100%, 25% for lists of words; 04/01/24: 100% legibility for a short 3 word sentence using L hand.  Goal status: achieved  8. Pt. Will independently, and efficiently transfer small 1/2 objects from the left hand to the right hand in midline in preparation from reaching up  to  place items at a target with 100% accuracy with the right hand. Baseline: 03/02/24: Pt. Has progressed to transferring 1.5 objects between her hand in midline, and reach up to place them at targets with 60% accuracy; 03/30/24: Pt can transfer jumbo pegs and clothespins (~1/5) from R to L hand with extra time, repeat trials, and occasional dropping. Pt continues to reach toward a target with ~60% accuracy.  Goal Status: ongoing  9. Pt. Will increased BUE strength by 2 mm grades to assist with ADLs, and IADLs.  Baseline: 03/02/24: UE strength: Shoulder flexion: R: 3+/5 L: 4/5, abduction: R: 3-/5,  L 4/5:  elbow flexion: R:4+/5, L: 5/5, extension: R: 4+/5, L:5/5, wrist flexion: R: 4-/5  L: 4/5 wrist extension: R: 4-/5 L: 4/5; 04/01/24: see chart; no significant changes  Goal Status: New: 03/02/24  ASSESSMENT: CLINICAL IMPRESSION: Pt demonstrated good motivation and participation throughout OT session, and acknowledged willingness to trial recommended activities to promote progress towards OT goals.  Pt was able to achieve upright sitting posture while seated edge of mat without lateral support and feet supported on floor for duration of OT session.  Pt required repeat assist for positioning pen in her L non-dominant hand, including demo and min A for set up.  Pt prefers standard pen over built up pen.  Pt found positioning top of paper slightly angled downward to help with writing within lines when using her L hand.  Legibility fair for printed and signed name, but some difficulty stabilizing paper with R hand/forearm.  Pt continues to struggle with removing and replacing items from purse and wallet, but found positioning strap around neck and purse hanging between legs in sitting to help widen purse for easier access.  Increased difficulty when strap was around shoulder.  Min vc and extra time for zipping/unzipping.  Pt will continue to benefit from skilled OT to address above noted goals, working towards  increased indep with daily tasks and reduced burden of care on caregiver.   PERFORMANCE DEFICITS: in functional skills including ADLs, IADLs, coordination, dexterity, edema, tone, ROM, strength, pain, Fine motor control, Gross motor control, endurance, and UE functional use, and psychosocial skills including coping strategies, environmental adaptation, habits, and routines and behaviors.   IMPAIRMENTS: are limiting patient from ADLs, IADLs, rest and sleep, leisure, and social participation.   CO-MORBIDITIES: may have co-morbidities  that affects occupational performance. Patient will benefit from skilled OT to address above impairments and improve overall function.  MODIFICATION OR ASSISTANCE TO COMPLETE EVALUATION: Min-Moderate modification of tasks or assist with assess necessary to complete an evaluation.  OT OCCUPATIONAL PROFILE AND HISTORY: Detailed assessment: Review of records and additional review of physical, cognitive, psychosocial history related to current functional performance.  CLINICAL DECISION MAKING: Moderate - several treatment options, min-mod task modification necessary  REHAB POTENTIAL: Good  EVALUATION COMPLEXITY: Moderate  PLAN:  OT FREQUENCY: 2x/week  OT DURATION: 12 weeks  PLANNED INTERVENTIONS: 97168 OT Re-evaluation, 97535 self care/ADL training, 02889 therapeutic exercise, 97530 therapeutic activity, 97112 neuromuscular re-education, 97140 manual therapy, 97018 paraffin, 02960 fluidotherapy, 97010 moist heat, 97010 cryotherapy, 97034 contrast bath, 97032 electrical stimulation (manual), 97760 Orthotic Initial, passive range of motion, energy conservation, coping strategies training, patient/family education, and DME and/or AE instructions  RECOMMENDED OTHER SERVICES: PT   CONSULTED AND AGREED WITH PLAN OF CARE: Patient  PLAN FOR NEXT SESSION: Treatment  Inocente Blazing, MS, OTR/L

## 2024-04-20 ENCOUNTER — Ambulatory Visit

## 2024-04-20 ENCOUNTER — Encounter: Admitting: Speech Pathology

## 2024-04-20 DIAGNOSIS — R269 Unspecified abnormalities of gait and mobility: Secondary | ICD-10-CM

## 2024-04-20 DIAGNOSIS — R278 Other lack of coordination: Secondary | ICD-10-CM

## 2024-04-20 DIAGNOSIS — M6281 Muscle weakness (generalized): Secondary | ICD-10-CM

## 2024-04-20 DIAGNOSIS — R2689 Other abnormalities of gait and mobility: Secondary | ICD-10-CM

## 2024-04-20 DIAGNOSIS — R262 Difficulty in walking, not elsewhere classified: Secondary | ICD-10-CM

## 2024-04-20 DIAGNOSIS — R296 Repeated falls: Secondary | ICD-10-CM

## 2024-04-20 NOTE — Therapy (Signed)
 OUTPATIENT PHYSICAL THERAPY TREATMENT   Patient Name: Erika Stanton MRN: [id number] DOB:Mar 09, 1956, 68 y.o., female Today's Date: 04/20/2024  PCP: Dr. Oneil Galloway  REFERRING PROVIDER: Dr. Oneil Galloway  END OF SESSION:   PT End of Session - 04/20/24 1436     Visit Number 33    Number of Visits 45    Date for Recertification  05/27/24    Authorization Type [employer]    Progress Note Due on Visit 40    PT Start Time 1400    PT Stop Time 1440    PT Time Calculation (min) 40 min    Equipment Utilized During Treatment Gait belt    Activity Tolerance Patient tolerated treatment well    Behavior During Therapy WFL for tasks assessed/performed               Past Medical History:  Diagnosis Date   Anatomical narrow angle, bilateral    Aneurysm    Asthma    Cerebral aneurysm    Cerebral vasospasm    Cognitive change    Dysphagia, post-stroke    GERD (gastroesophageal reflux disease)    Hemorrhoids    History of cervical dysplasia    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 no recurrent polyps.  suggest repeat surveillance study 11/2022.     COLONOSCOPY W/ POLYPECTOMY  06/2014   Dr Elder at [facility].  3 adenomatoous polyps, anal fissure.     ESOPHAGOGASTRODUODENOSCOPY (EGD) WITH PROPOFOL  N/A 01/27/2020   Procedure: ESOPHAGOGASTRODUODENOSCOPY (EGD) WITH PROPOFOL ;  Surgeon: Shila Gustav GAILS, MD;  Location: [facility];  Service: Endoscopy;  Laterality: N/A;   INSERTION OF PERMANENT INTRAPERITONEAL CANNULA/CATH N/A    LAPAROSCOPIC   IR 3D  INDEPENDENT WKST  01/06/2020   IR ANGIO INTRA EXTRACRAN SEL INTERNAL CAROTID BILAT MOD SED  01/06/2020   IR ANGIO VERTEBRAL SEL VERTEBRAL UNI L MOD SED  01/06/2020   IR ANGIOGRAM FOLLOW UP STUDY  01/06/2020   IR ANGIOGRAM FOLLOW UP STUDY  01/06/2020   IR ANGIOGRAM FOLLOW UP STUDY  01/06/2020   IR ANGIOGRAM FOLLOW UP STUDY  01/06/2020   IR ANGIOGRAM FOLLOW UP STUDY  01/06/2020   IR ANGIOGRAM FOLLOW UP STUDY  01/06/2020   IR ANGIOGRAM FOLLOW UP STUDY  01/06/2020   IR ANGIOGRAM FOLLOW UP STUDY  01/06/2020   IR ANGIOGRAM FOLLOW UP STUDY  01/06/2020   IR ANGIOGRAM FOLLOW UP STUDY  01/06/2020   IR NEURO EACH ADD'L AFTER BASIC UNI RIGHT (MS)  01/06/2020   IR TRANSCATH/EMBOLIZ  01/06/2020   MASTECTOMY     MOHS SURGERY N/A    RADIOLOGY WITH ANESTHESIA N/A 01/06/2020   Procedure: IR WITH ANESTHESIA FOR ANEURYSM;  Surgeon: Lanis Pupa, MD;  Location: [facility] OR;  Service: Radiology;  Laterality: N/A;   STENT SUPPORTED EMBOLIZATION OF RIGHT ICA ANEURYSM Right    TUBAL LIGATION     VENTRICULO-PERITONEAL SHUNT PLACEMENT / LAPAROSCOPIC INSERTION PERITONEAL CATHETER N/A    Patient Active Problem  List   Diagnosis Date Noted   Dyspareunia in female 04/05/2020   History of abnormal cervical Pap smear 03/10/2020   GIB (gastrointestinal bleeding) 02/28/2020   Elevated BUN    Prediabetes    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   Cerebral vasospasm    Sinus tachycardia    Dysphagia, post-stroke    Thrombocytopenia    Acute blood loss anemia    Brain aneurysm    ICH (intracerebral hemorrhage) (HCC) 01/05/2020   History of adenomatous polyp of colon 02/07/2016   Anatomical narrow angle, bilateral 12/14/2014   Fissure in ano 11/11/2014   Hemorrhoids 11/11/2014   Constipation 11/19/2013   GERD (gastroesophageal reflux disease) 03/24/2013   Irritable bowel syndrome with diarrhea 03/24/2013   Herpes zoster 05/14/2005   Abnormal Pap smear of cervix 05/15/1983   History of cervical dysplasia  05/15/1983    ONSET DATE: 12/02/2023  REFERRING DIAG: I63.9 (ICD-10-CM) - CVA (cerebral vascular accident) (HCC)   THERAPY DIAG:  Muscle weakness (generalized)  Other lack of coordination  Difficulty in walking, not elsewhere classified  Repeated falls  Abnormality of gait and mobility  Other abnormalities of gait and mobility  Rationale for Evaluation and Treatment: Rehabilitation  SUBJECTIVE:                                                                                                                                                                                             SUBJECTIVE STATEMENT:  Had knee injections earlier today in [place].   PERTINENT HISTORY:   Patient reports she was in hospital from [facility]- Sat (July 21st- 25th) with CVA affecting left side. I was supposed to have a knee replacement surgery in September but that is on hold for now. I was doing my prep for my colonoscopy and fell in the bathroom.  Patient reports having mild left sided weakness compared to her CVA in 2021 with heavy Right sided UE weakness. She reports bad OA affecting both knees as well. Previous CVA in 2021 affecting Right side. Reports going to ED  last Monday due to fall/CVA affecting Left side. Diagnosed with R CVA with Left Sided weakness.    PAIN:  Are you having pain? B pain knees, 3/10  PRECAUTIONS: Fall  WEIGHT BEARING RESTRICTIONS: No  FALLS: Has patient fallen in last 6 months? Yes. Number of falls fell with the stroke  PATIENT GOALS: Walk without any assistive device.  OBJECTIVE:  Note: Objective measures were completed at evaluation unless otherwise noted.  TREATMENT DATE : 04/20/2024 -AMB overground x30 minutes, intermittent seated rest breaks, SPC for modI transfers and household distances. Pt limited by knee pain bilat, unable to achieve  >177ft without standing break.  -standing hands free balance, sorting a deck of cards c LLE: no LOB.    PATIENT EDUCATION: Education details: PT plan of care; Discussion of symptoms of CVA and to go to ED as soon as possible. Purpose of PT for balance, strength, coordination and endurance.  Person educated: Patient Education method: Explanation Education comprehension: verbalized understanding  HOME EXERCISE PROGRAM: Access Code: [email or web address] URL: [email or web address] Date: 12/17/2023 Prepared by: Sidra Simpers  Program Notes **Be sure to perform all exercises next to a countertop or sturdy piece of furniture in case you become unsteady.  Exercises - Standing Heel Raise with Support  - 1 x daily - 7 x weekly - 2 sets - 15 reps - Standing Toe Raises at Chair  - 1 x daily - 7 x weekly - 2 sets - 15 reps - Mini Squat with Counter Support  - 1 x daily - 7 x weekly - 2 sets - 15 reps - Lunge with Counter Support  - 1 x daily - 7 x weekly - 2 sets - 15 reps - Standing Tandem Balance with Counter Support  - 1 x daily - 7 x weekly - 2 sets - 2 reps - 30 hold - Standing Single Leg Stance with Counter Support  - 1 x daily - 7 x weekly - 2 sets - 2 reps - 30 hold  GOALS: Goals reviewed with patient? Yes  SHORT TERM GOALS: Target date: 01/21/2024  Pt will be independent with HEP in order to improve strength and balance in order to decrease fall risk and improve function at home.  Baseline: EVAL - No current formal HEP in place 03/04/2024- Patient reports independent with all current HEP and riding a stationary bike.  Goal status: MET   LONG TERM GOALS: Target date: 05/27/2024  1.  Patient will complete five times sit to stand test in < 15 seconds indicating an increased LE strength and improved balance. Baseline: EVAL= 25.20 sec with LUE Support 9/2: 23.36 without UE support and CGA-min assist. 20.10 sec with LUE support 03/04/2024- more difficulty raising without UE but  performed 18.75 sec with LUE Support 04/06/24: 21.99s with L UE support on chair rail 12/4:16.59 sec with L UE assist   Goal status: IN PROGRESS   2.  Patient will increase Berg Balance score by > 6 points to demonstrate decreased fall risk during functional activities. Baseline: EVAL: To be assessed next visit; 12/12/2023= 42/56 9/2: 44/56  10/16:45/56 12/4: 47 Goal status: PROGRESSING   3.  Patient will reduce timed up and go to <11 seconds to reduce fall risk and demonstrate improved transfer/gait ability. Baseline: EVAL = 40 sec with RW 9/2: 18.33sec with SPC 10/16:17.47 sec with SPC 04/06/24: 20.17s w/ SPC 12/4:20.86 sec with SPC 16.58 sec with no AD  Goal status: PROGRESSING  4.  Patient will increase 10 meter walk test to >1.65m/s as to improve gait speed for better community ambulation and to reduce fall risk. Baseline: EVAL: 0.65 m/s 9/2: 0.67 m/s with [facility] 9/9: 0.71 m/s without an AD and wearing 2.5 # AW 10/16: .75 m/s with [facility] 04/06/24: 0.51 m/s 12/4: .675 m/s with SPC, .70 m/s no AD  Goal status: PROGRESSING  5. Patient will increase six minute walk test distance by 200 feet for progression to community ambulator and  improve gait ability Baseline: EVAL= 482' 9/2: 465 feet using a SPC with CGA required seated rest break at 4:21min   10/16: 542 ft and full 6 minutes  04/06/24: 525 ft using SPC with CGA, 3 rest breaks taken throughout, @ 2:31 min, 3:52min, and 5:47min 12/4:625 ft total mixed SPC and no SPC initial 300 ft no AD only one standing rest break allowed around 400 ft, encouraged pt to not rest during test.  Goal status: PROGRESS    ASSESSMENT:  CLINICAL IMPRESSION:  Cotinued focus on walking progression. Pt requires standing rest breaks every 156ft, seated breaks every 232ft or less. Pt continues to be very much limited by bilat knee DJD. Knee injections earlier today did not make any changes to functional activity tolerance. Pt will continue to benefit  from skilled therapy to address remaining deficits in order to improve overall QoL and return to PLOF.   OBJECTIVE IMPAIRMENTS: Abnormal gait, decreased activity tolerance, decreased balance, decreased coordination, decreased endurance, decreased mobility, difficulty walking, decreased ROM, decreased strength, impaired UE functional use, and pain.   ACTIVITY LIMITATIONS: carrying, lifting, bending, sitting, standing, squatting, sleeping, stairs, and transfers  PARTICIPATION LIMITATIONS: meal prep, cleaning, laundry, driving, shopping, community activity, and yard work  PERSONAL FACTORS: 1-2 comorbidities: OA, previous CVA with significant R sided weakness are also affecting patient's functional outcome.   REHAB POTENTIAL: Good  CLINICAL DECISION MAKING: Stable/uncomplicated  EVALUATION COMPLEXITY: Moderate  PLAN:  PT FREQUENCY: 1-2x/week  PT DURATION: 12 weeks  PLANNED INTERVENTIONS: 97164- PT Re-evaluation, 97750- Physical Performance Testing, 97110-Therapeutic exercises, 97530- Therapeutic activity, 97112- Neuromuscular re-education, 97535- Self Care, [id number]- Manual therapy, [phone]- Gait training, [phone]- Orthotic Initial, [phone]- Orthotic/Prosthetic subsequent, [phone]- Canalith repositioning, [phone]- Electrical stimulation (manual), [phone] (1-2 muscles), [phone] (3+ muscles)- Dry Needling, Patient/Family education, Balance training, Stair training, Taping, Joint mobilization, Joint manipulation, Spinal manipulation, Spinal mobilization, Compression bandaging, Vestibular training, DME instructions, Cryotherapy, and Moist heat  PLAN FOR NEXT SESSION:  -start planning for DC to independent home program.   2:41 PM, 04/20/24 Peggye JAYSON Linear, PT, DPT Physical Therapist - [facility]  Outpatient Physical Therapy- Main Campus [phone]

## 2024-04-20 NOTE — Therapy (Signed)
 Outpatient Occupational Therapy Neuro Treatment Note  Patient Name: Erika Stanton MRN: [id number] DOB:May 04, 1956, 68 y.o., female Today's Date: 04/20/2024  PCP: Laurice Anes, MD REFERRING PROVIDER: Laurice Anes, MD   OT End of Session - 04/20/24 1428     Visit Number 35    Number of Visits 48    Date for Recertification  05/25/24    Authorization Time Period Reporting period beginning 03/30/24    OT Start Time 1445    OT Stop Time 1530    OT Time Calculation (min) 45 min    Equipment Utilized During Treatment transport chair, SPC    Activity Tolerance Patient tolerated treatment well    Behavior During Therapy WFL for tasks assessed/performed         Past Medical History:  Diagnosis Date   Anatomical narrow angle, bilateral    Aneurysm    Asthma    Cerebral aneurysm    Cerebral vasospasm    Cognitive change    Dysphagia, post-stroke    GERD (gastroesophageal reflux disease)    Hemorrhoids    History of cervical dysplasia    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. no recurrent polyps.  suggest repeat surveillance study 11/2022.     COLONOSCOPY W/ POLYPECTOMY  06/2014   Dr Elder at [facility].  3 adenomatoous polyps, anal fissure.     ESOPHAGOGASTRODUODENOSCOPY (EGD) WITH PROPOFOL  N/A 01/27/2020   Procedure: ESOPHAGOGASTRODUODENOSCOPY (EGD) WITH PROPOFOL ;  Surgeon: Shila Gustav GAILS, MD;  Location: [facility] ENDOSCOPY;  Service: Endoscopy;  Laterality: N/A;   INSERTION OF PERMANENT INTRAPERITONEAL CANNULA/CATH N/A    LAPAROSCOPIC   IR 3D  INDEPENDENT WKST  01/06/2020   IR ANGIO INTRA EXTRACRAN SEL INTERNAL CAROTID BILAT MOD SED  01/06/2020   IR ANGIO VERTEBRAL SEL VERTEBRAL UNI L MOD SED  01/06/2020   IR ANGIOGRAM FOLLOW UP STUDY  01/06/2020   IR ANGIOGRAM FOLLOW UP STUDY  01/06/2020   IR ANGIOGRAM FOLLOW UP STUDY  01/06/2020   IR ANGIOGRAM FOLLOW UP STUDY  01/06/2020   IR ANGIOGRAM FOLLOW UP STUDY  01/06/2020   IR ANGIOGRAM FOLLOW UP STUDY  01/06/2020   IR ANGIOGRAM FOLLOW UP STUDY  01/06/2020   IR ANGIOGRAM FOLLOW UP STUDY  01/06/2020   IR ANGIOGRAM FOLLOW UP STUDY  01/06/2020   IR ANGIOGRAM FOLLOW UP STUDY  01/06/2020   IR NEURO EACH ADD'L AFTER BASIC UNI RIGHT (MS)  01/06/2020   IR TRANSCATH/EMBOLIZ  01/06/2020   MASTECTOMY     MOHS SURGERY N/A    RADIOLOGY WITH ANESTHESIA N/A 01/06/2020   Procedure: IR WITH ANESTHESIA FOR ANEURYSM;  Surgeon: Lanis Pupa, MD;  Location: [facility] OR;  Service: Radiology;  Laterality: N/A;   STENT SUPPORTED EMBOLIZATION OF RIGHT ICA ANEURYSM Right    TUBAL LIGATION     VENTRICULO-PERITONEAL SHUNT PLACEMENT / LAPAROSCOPIC INSERTION PERITONEAL CATHETER N/A    Patient Active Problem List   Diagnosis Date Noted   Dyspareunia in female 04/05/2020  History of abnormal cervical Pap smear 03/10/2020   GIB (gastrointestinal bleeding) 02/28/2020   Elevated BUN    Prediabetes    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   Cerebral vasospasm    Sinus tachycardia    Dysphagia, post-stroke    Thrombocytopenia    Acute blood loss anemia    Brain aneurysm    ICH (intracerebral hemorrhage) (HCC) 01/05/2020   History of adenomatous polyp of colon 02/07/2016   Anatomical narrow angle, bilateral 12/14/2014   Fissure in ano 11/11/2014   Hemorrhoids 11/11/2014   Constipation 11/19/2013   GERD (gastroesophageal reflux disease) 03/24/2013   Irritable bowel syndrome with diarrhea 03/24/2013   Herpes zoster 05/14/2005   Abnormal Pap smear of cervix 05/15/1983   History of cervical dysplasia  05/15/1983   ONSET DATE: 12/02/2023  REFERRING DIAG: L ischemic CVA  THERAPY DIAG:  Other lack of coordination  Muscle weakness (generalized)  Rationale for Evaluation and Treatment: Rehabilitation  SUBJECTIVE:  SUBJECTIVE STATEMENT: Pt reports she forgot her palm protector today but wears it off and on daily.   PERTINENT HISTORY:  Pt. was admitted to [facility] from 12/02/23-12/06/23 with an Acute Right MCA CVA, Hydrocephalus with shunt  placement. History of Subarachnoid Hemorrhage with bilateral ICA coiled aneurysms. 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  PRECAUTIONS: None  WEIGHT BEARING RESTRICTIONS: No  PAIN: 04/16/24: No UE pain, 3-4/10 in knees Are you having pain? No  FALLS: Has patient fallen in last 6 months? Yes. Number of falls 1  LIVING ENVIRONMENT: Lives with: lives with their family and lives with their spouse Lives in: House/apartment Stairs: hand rails, 2 steps   Has following equipment at home: [name], cane  PLOF: Independent with basic ADLs, Spouse help as needed  PATIENT GOALS: Strengthening  OBJECTIVE:  Note: Objective measures were completed at Evaluation unless otherwise noted.  HAND DOMINANCE: Left  ADLs: Overall ADLs: spouse assists when needed, Pt. reports that she can do most tasks herself. Uses her left hand to engage in self-care tasks. Eating: Able to cut foods with a fork, husband assists as needed.  Grooming: husband assists with applying toothpaste, able to brush teeth and hair UB Dressing: Spouse assists with applying/fastening bra, Pt. Requires assistance with zippers, and buttons. LB Dressing: Spouse assists with tying shoes when needed, uses slip on shoes. Pt.  Does not zip pants. Toileting: Able to complete independently Bathing: Pt. Requires intermittent assistance with bathing tasks. Tub Shower transfers: Spouse assists with transfers when/if needed Equipment: Shower seat with back  IADLs: Shopping: Spouse typically does the shopping, isnt able to carry heavy objects.  Light housekeeping: Spouse typically does most of the cleaning around the home, Pt. Cleans bathroom, and laundry Pt. Is unable to carry laundry due to exacerbation of weakness.  Meal Prep: Spouse typically cooks and prepares meals.  Community mobility: Pt. Requires assistance using walker. Pt. reports that she is unable to carry heavy items, and typically does not do the shopping.  Medication management: Pt. is able to take care of meds with pillbox Financial management:  Spouse takes care of it, and makes online payments.  Handwriting: 50% legible  MOBILITY STATUS: Needs Assist: Pt. Requires use of a walker   POSTURE COMMENTS:  No Significant postural limitations Sitting balance: Good  ACTIVITY TOLERANCE: Activity tolerance:   FUNCTIONAL OUTCOME MEASURES: TBD  UPPER EXTREMITY ROM:    Active ROM Right eval Right 01/16/24 Right 03/02/24 Right 04/01/24  Left eval Left 01/16/24 Left 03/02/24 Left 04/01/24  Shoulder flexion 94(121) 100 with scaption (132) 126(146) 100 some scaption (145) 102(125) 134 with scaption (145) 142(148) 140 (155)  Shoulder abduction 75(106) 74 (98) 93(120) 58(90) 110(126) 140 (170) WNL 165 (180)  Shoulder adduction          Shoulder extension          Shoulder internal rotation          Shoulder external rotation          Elbow flexion 135(155)  150(155)  140(150)  150   Elbow extension 0  0  -10(-9)  -4(0)   Wrist flexion 40(44)  48(60)  42(58)  52(60)   Wrist extension 30(34)  48(60)  40(50)  52(56)   Wrist ulnar deviation          Wrist radial deviation          Wrist pronation          Wrist supination          (Blank rows = not  tested)  UPPER EXTREMITY MMT:     MMT Right eval Right 03/02/24 Right 04/01/24 Left eval Left 03/02/24 Left 04/01/24  Shoulder flexion 3-/5 3+/5 3- 3-/5 4/5 4/5  Shoulder abduction 3-/5 3-/5 3- 3-/5 4/5 4/5  Shoulder adduction        Shoulder extension        Shoulder internal rotation        Shoulder external rotation        Middle trapezius        Lower trapezius        Elbow flexion 4-/5 4+/5 4+ 4+/5 5/5 5/5  Elbow extension 4-/5 4+/5 4/5 4+/5 5/5 5/5  Wrist flexion 4-/5 4-/5 4/5 3+/5 4/5 4+/5  Wrist extension 4-/5 4-/5 4- 3+/5 4/5 4+/5  Wrist ulnar deviation        Wrist radial deviation        Wrist pronation        Wrist supination        (Blank rows = not tested)  HAND FUNCTION Eval:  Grip strength: Right: N/T lbs; Left: 43 lbs, Lateral pinch: Right: N/T lbs, Left: 9 lbs, and 3 point pinch: Right: N/T lbs, Left: 5 lbs Pt. Presents with flexor tightness in the right 5th digit, however is able to passively extend 5th digit within normal range. Pt. Also Presents with hyper extension in the 2nd and 3rd digit at PIP joints. Pt. Is able to achieve full Digit MP, PIP, and DIP PROM.  01/16/24: Grip strength: Left: 33 lbs; Lateral pinching: Left: 8 lbs; 3 point pinch: Left: 3 lbs  03/02/24: Grip strength: Left: 36 lbs; Lateral pinching: Left: 8 lbs; 3 point pinch: Left: 3 lbs  04/01/24: Grip strength: Left: (3 trials) 32 lbs, 26 lbs, 34 lbs; Lateral pinch: Pinch gauge unavailable (out for re-calibration)  COORDINATION: 9 Hole Peg test: Right: N/T sec; Left: 51 sec 01/16/24: Left: 1 min 1 sec 03/02/24:  Left: 1 min 1 sec 04/01/24: Left: 58 sec  SENSATION: Not tested  EDEMA:   MUSCLE TONE: Fluctuating flexor tone in R hand.  COGNITION: Overall cognitive status: Within functional limits for tasks assessed  VISION: Subjective report:  Baseline vision:  Visual history:   VISION ASSESSMENT:  PERCEPTION:   PRAXIS:  OBSERVATIONS: Pt. presents with fluctuating  flexor tone in the R hand, involving the 5th digit at the PIP/DIP joint. Pt. presents with hyperextension at the 2nd and 3rd digits at the PIP joints.                                                                                                                    TREATMENT DATE: 04/20/24  Therapeutic Exercise: -yellow theraband issued with red foam roller handle issued for HEP. Completed 2 set x 10 rep each: shoulder flexion, horizontal ab/adduction, elbow flexion/ext.   -L hand lateral, and 3pt. Pinch strengthening using yellow, red, green, and blue, and black level resistive clips  Therapeutic Activity: -Pt focused on BUE 3pt pinch manipulating 1 inch ball knob on peg. Pt placed 20 knobs onto peg board using L hand, removed 15 with R hand dropping 3. -Focused on translation picking 6 dice up from table and holding in palm while dropping one at a time into jar. Repeated with poker chips holding 2 at a time and placing into slotted lid - focus on manipulating from palm to fingertips with thumb.  PATIENT EDUCATION: Education details: handwriting strategies and positioning to remove and return items from purse and wallet Person educated: Patient Education method: Explanation and Verbal cues Education comprehension: verbal cues required  HOME EXERCISE PROGRAM: -Pink theraputty; visual handout issued -Self and caregiver assisted PROM to R hand and wrist -Monitor splint with skin checks  GOALS: Goals reviewed with patient? Yes  SHORT TERM GOALS: Target date: 04/13/2024   Pt. Will be independent with HEP for UE functioning. Baseline: Eval: No current HEP; 01/16/24: pt reports doing a little writing and squeezing a ball, but denies using putty; spouse stretches RUE daily 02/20/24:  Pt. Continues to do a little writing and squeezing a ball, but denies using putty; spouse stretches RUE 03/02/24: Pt. Husband assists Pt. With ROM to the right hand. Pt. reports doing hand exercises; 03/30/24: Spouse  indep with HEP (spouse assists daily) Goal status: achieved  LONG TERM GOALS: Target date: 05/25/2024  Pt. Will improve [facility] skills to be able to manipulate small objects at home Baseline: 9 hole peg test: L: 51 seconds; 01/16/24: L 1 min 1 sec, 03/02/24: Left: 1 min. & 1 sec. Pt. Continues to work towards using the left to manipulate small objects; 04/01/24: L 58 sec (multiple trials to improve time) Goal status: Ongoing  2.  Pt. Will increase BUE shoulder ROM by 10 degrees to be able to reach into cabinets and shelves.  Baseline: Shoulder flexion: R: 94(121), L: 102(125), Shoulder abduction: R: 75(106), L: 110(126); 01/16/24: Shoulder flexion: R: 100 with scaption (132), L: 134 with scaption (145), Shoulder abduction: R: 74 (98), L: 140 (170)02/20/24: Pt. Is improving with functional reaching through multiple planes with cues, and assist required. 03/02/24: Shoulder flexion: R: 126(146), L: 142(148), Shoulder abduction: R: 93(120), L: WNL; 03/30/24: ROM to be updated next session; limited by time constraints with poc review, though pt reports she does not  reach into cabinets; 04/01/24: Shoulder flexion: R: 100 (145), L: 140 (155); Shoulder abd: R: 50 (90), L: 165 (180); pt reports she does not reach into cabinets Goal status: Ongoing  3.  Pt. Will improve L grip strength by 5# to be able to securely grasp ADL items at home.  Baseline: Grip strength: R: NT, L: 43#; 01/16/24: L: 33#  03/02/24: 36#; 04/01/24: L: 34 lbs (best of 3 trials) Goal status: Ongoing  4.  Pt. Will increase L lateral key pinch strength by 3#  to open wide mouth jars.  Baseline: Lateral key pinch: R: NT, L: 9#; 01/16/24: L: 8# 03/02/24: Pinch meter is out for calibration. Pt. Continues to have difficulty opening jars; 04/01/24: NT (pinch gauge not available); continued difficulty opening jars Goal status: Ongoing  5.  Pt. Will independently engage the right hand as a gross assist to the left hand 100% of the time during  ADLs/IADLs. Baseline: Eval: Pt. With limited engagement of the right hand as a gross assist during tasks; 01/16/24: Observed <25% of the time during OT sessions 02/20/24: Pt. Continues to present with limited engagement of the RUE as a gross assist to the left hand approximately 25% of the time with cues. 03/02/24: Pt. Is starting to engage her right hand more as a gross assist to the left; 04/01/24: Pt engages the R hand as a fair assist to the L with intermittent vc from OT during tx sessions; pt admits she needs to use the R hand more at home. Goal status: Ongoing  6.  Pt. Will be able to manipulate zippers, and buttons efficiently with modified independence Baseline: Eval:Right: NT Left: 51 sec; 01/16/24: Left: 1 min and 1 sec 02/20/24: Pt. continues to have difficulty manipulating zippers and buttons.03/02/24: Pt. continues to have difficulty manipulating zippers, and buttons, and may benefit from reviewing adaptive devices; 04/01/24: Pt reports that she continues to avoid clothes with fasteners Goal status: Ongoing   7. Pt. Will improve handwriting legibility to 100% for one sentence in printed form for written correspondence.  Baseline: 50% legible in printed form for first name only; 01/16/24: 80-90% legible to print first name 1 of 4 attempts.  3 of 4 attempts 50% or less legible 02/20/24: first name only 100%, 25% for lists of words. 03/02/24:  first name only 100%, 25% for lists of words; 04/01/24: 100% legibility for a short 3 word sentence using L hand.  Goal status: achieved  8. Pt. Will independently, and efficiently transfer small 1/2 objects from the left hand to the right hand in midline in preparation from reaching up  to place items at a target with 100% accuracy with the right hand. Baseline: 03/02/24: Pt. Has progressed to transferring 1.5 objects between her hand in midline, and reach up to place them at targets with 60% accuracy; 03/30/24: Pt can transfer jumbo pegs and clothespins  (~1/5) from R to L hand with extra time, repeat trials, and occasional dropping. Pt continues to reach toward a target with ~60% accuracy.  Goal Status: ongoing  9. Pt. Will increased BUE strength by 2 mm grades to assist with ADLs, and IADLs.  Baseline: 03/02/24: UE strength: Shoulder flexion: R: 3+/5 L: 4/5, abduction: R: 3-/5, L 4/5:  elbow flexion: R:4+/5, L: 5/5, extension: R: 4+/5, L:5/5, wrist flexion: R: 4-/5  L: 4/5 wrist extension: R: 4-/5 L: 4/5; 04/01/24: see chart; no significant changes  Goal Status: New: 03/02/24  ASSESSMENT: CLINICAL IMPRESSION: Issued yellow theraband with foam handle for  HEP focused on BUE strengthening, min cues for technique for above exercises. Session focused on manipulating items and using R hand to remove ball pegs from pegboard. Focus on L hand translation moving items from palm to fingertip without proximal shoulder hiking.  Pt will continue to benefit from skilled OT to address above noted goals, working towards increased indep with daily tasks and reduced burden of care on caregiver.   PERFORMANCE DEFICITS: in functional skills including ADLs, IADLs, coordination, dexterity, edema, tone, ROM, strength, pain, Fine motor control, Gross motor control, endurance, and UE functional use, and psychosocial skills including coping strategies, environmental adaptation, habits, and routines and behaviors.   IMPAIRMENTS: are limiting patient from ADLs, IADLs, rest and sleep, leisure, and social participation.   CO-MORBIDITIES: may have co-morbidities  that affects occupational performance. Patient will benefit from skilled OT to address above impairments and improve overall function.  MODIFICATION OR ASSISTANCE TO COMPLETE EVALUATION: Min-Moderate modification of tasks or assist with assess necessary to complete an evaluation.  OT OCCUPATIONAL PROFILE AND HISTORY: Detailed assessment: Review of records and additional review of physical, cognitive, psychosocial  history related to current functional performance.  CLINICAL DECISION MAKING: Moderate - several treatment options, min-mod task modification necessary  REHAB POTENTIAL: Good  EVALUATION COMPLEXITY: Moderate  PLAN:  OT FREQUENCY: 2x/week  OT DURATION: 12 weeks  PLANNED INTERVENTIONS: 97168 OT Re-evaluation, 97535 self care/ADL training, 02889 therapeutic exercise, 97530 therapeutic activity, 97112 neuromuscular re-education, 97140 manual therapy, 97018 paraffin, 02960 fluidotherapy, 97010 moist heat, 97010 cryotherapy, 97034 contrast bath, 97032 electrical stimulation (manual), 97760 Orthotic Initial, passive range of motion, energy conservation, coping strategies training, patient/family education, and DME and/or AE instructions  RECOMMENDED OTHER SERVICES: PT   CONSULTED AND AGREED WITH PLAN OF CARE: Patient  PLAN FOR NEXT SESSION: Treatment  Elston Slot, M.S. OTR/L  04/20/24, 2:29 PM  ascom [phone]

## 2024-04-22 ENCOUNTER — Encounter: Admitting: Speech Pathology

## 2024-04-22 ENCOUNTER — Encounter: Payer: Self-pay | Admitting: Physical Therapy

## 2024-04-22 ENCOUNTER — Ambulatory Visit: Admitting: Physical Therapy

## 2024-04-22 ENCOUNTER — Ambulatory Visit

## 2024-04-22 DIAGNOSIS — R262 Difficulty in walking, not elsewhere classified: Secondary | ICD-10-CM

## 2024-04-22 DIAGNOSIS — R296 Repeated falls: Secondary | ICD-10-CM

## 2024-04-22 DIAGNOSIS — M6281 Muscle weakness (generalized): Secondary | ICD-10-CM

## 2024-04-22 DIAGNOSIS — R41841 Cognitive communication deficit: Secondary | ICD-10-CM

## 2024-04-22 DIAGNOSIS — R278 Other lack of coordination: Secondary | ICD-10-CM

## 2024-04-22 DIAGNOSIS — R2689 Other abnormalities of gait and mobility: Secondary | ICD-10-CM

## 2024-04-22 DIAGNOSIS — R269 Unspecified abnormalities of gait and mobility: Secondary | ICD-10-CM

## 2024-04-22 NOTE — Therapy (Signed)
 OUTPATIENT PHYSICAL THERAPY TREATMENT   Patient Name: MARILU RYLANDER MRN: [id number] DOB:Sep 23, 1955, 68 y.o., female Today's Date: 04/22/2024  PCP: Dr. Oneil Galloway  REFERRING PROVIDER: Dr. Oneil Galloway  END OF SESSION:   PT End of Session - 04/22/24 1453     Visit Number 34    Number of Visits 45    Date for Recertification  05/27/24    Authorization Type [employer]    Progress Note Due on Visit 40    PT Start Time 1447    PT Stop Time 1529    PT Time Calculation (min) 42 min    Equipment Utilized During Treatment Gait belt    Activity Tolerance Patient tolerated treatment well    Behavior During Therapy WFL for tasks assessed/performed               Past Medical History:  Diagnosis Date   Anatomical narrow angle, bilateral    Aneurysm    Asthma    Cerebral aneurysm    Cerebral vasospasm    Cognitive change    Dysphagia, post-stroke    GERD (gastroesophageal reflux disease)    Hemorrhoids    History of cervical dysplasia    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 no recurrent polyps.  suggest repeat surveillance study 11/2022.     COLONOSCOPY W/ POLYPECTOMY  06/2014   Dr Elder at [facility].  3 adenomatoous polyps, anal fissure.     ESOPHAGOGASTRODUODENOSCOPY (EGD) WITH PROPOFOL  N/A 01/27/2020   Procedure: ESOPHAGOGASTRODUODENOSCOPY (EGD) WITH PROPOFOL ;  Surgeon: Shila Gustav GAILS, MD;  Location: [facility] END[facility]COPY;  Service: Endoscopy;  Laterality: N/A;   INSERTION OF PERMANENT INTRAPERITONEAL CANNULA/CATH N/A    LAPAROSCOPIC   IR  3D INDEPENDENT WKST  01/06/2020   IR ANGIO INTRA EXTRACRAN SEL INTERNAL CAROTID BILAT MOD SED  01/06/2020   IR ANGIO VERTEBRAL SEL VERTEBRAL UNI L MOD SED  01/06/2020   IR ANGIOGRAM FOLLOW UP STUDY  01/06/2020   IR ANGIOGRAM FOLLOW UP STUDY  01/06/2020   IR ANGIOGRAM FOLLOW UP STUDY  01/06/2020   IR ANGIOGRAM FOLLOW UP STUDY  01/06/2020   IR ANGIOGRAM FOLLOW UP STUDY  01/06/2020   IR ANGIOGRAM FOLLOW UP STUDY  01/06/2020   IR ANGIOGRAM FOLLOW UP STUDY  01/06/2020   IR ANGIOGRAM FOLLOW UP STUDY  01/06/2020   IR ANGIOGRAM FOLLOW UP STUDY  01/06/2020   IR ANGIOGRAM FOLLOW UP STUDY  01/06/2020   IR NEURO EACH ADD'L AFTER BASIC UNI RIGHT (MS)  01/06/2020   IR TRANSCATH/EMBOLIZ  01/06/2020   MASTECTOMY     MOHS SURGERY N/A    RADIOLOGY WITH ANESTHESIA N/A 01/06/2020   Procedure: IR WITH ANESTHESIA FOR ANEURYSM;  Surgeon: Lanis Pupa, MD;  Location: [facility] OR;  Service: Radiology;  Laterality: N/A;   STENT SUPPORTED EMBOLIZATION OF RIGHT ICA ANEURYSM Right    TUBAL LIGATION     VENTRICULO-PERITONEAL SHUNT PLACEMENT / LAPAROSCOPIC INSERTION PERITONEAL CATHETER N/A    Patient Active Problem  List   Diagnosis Date Noted   Dyspareunia in female 04/05/2020   History of abnormal cervical Pap smear 03/10/2020   GIB (gastrointestinal bleeding) 02/28/2020   Elevated BUN    Prediabetes    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   Cerebral vasospasm    Sinus tachycardia    Dysphagia, post-stroke    Thrombocytopenia    Acute blood loss anemia    Brain aneurysm    ICH (intracerebral hemorrhage) (HCC) 01/05/2020   History of adenomatous polyp of colon 02/07/2016   Anatomical narrow angle, bilateral 12/14/2014   Fissure in ano 11/11/2014   Hemorrhoids 11/11/2014   Constipation 11/19/2013   GERD (gastroesophageal reflux disease) 03/24/2013   Irritable bowel syndrome with diarrhea 03/24/2013   Herpes zoster 05/14/2005   Abnormal Pap smear of cervix 05/15/1983   History of cervical  dysplasia 05/15/1983    ONSET DATE: 12/02/2023  REFERRING DIAG: I63.9 (ICD-10-CM) - CVA (cerebral vascular accident) (HCC)   THERAPY DIAG:  Other lack of coordination  Cognitive communication deficit  Difficulty in walking, not elsewhere classified  Repeated falls  Abnormality of gait and mobility  Muscle weakness (generalized)  Other abnormalities of gait and mobility  Rationale for Evaluation and Treatment: Rehabilitation  SUBJECTIVE:                                                                                                                                                                                             SUBJECTIVE STATEMENT:  Reports she is feeling fine today, and had a good birthday. Still complaints of B knee pain. Pt wearing her hand brace today on R UE.  PERTINENT HISTORY:   Patient reports she was in hospital [facility]- Sat (July 21st- 25th) with CVA affecting left side. I was supposed to have a knee replacement surgery in September but that is on hold for now. I was doing my prep for my colonoscopy and fell in the bathroom.  Patient reports having mild left sided weakness compared to her CVA in 2021 with heavy Right sided UE weakness. She reports bad OA affecting both knees as well. Previous CVA in 2021 affecting Right side. Reports going to ED  last Monday due to fall/CVA affecting Left side. Diagnosed with R CVA with Left Sided weakness.    PAIN:  Are you having pain? B pain knees, 3/10  PRECAUTIONS: Fall  WEIGHT BEARING RESTRICTIONS: No  FALLS: Has patient fallen in last 6 months? Yes. Number of falls fell with the stroke  PATIENT GOALS: Walk without any assistive device.  OBJECTIVE:  Note: Objective measures were completed at evaluation unless otherwise noted.  TREATMENT DATE : 04/22/2024  185ft ambulation using SPC,  standing break at 129ft due to knee pain, able to finish lap into gym to chair Sideways ambulation in // bars w/ 2# AW on B LE, 4 laps, U UE support on bar BWD ambulation in // bars w/ 2# AW on B LE, 3 laps, U UE support on bar Standing toe taps onto 6in step, U UE support on // bars, 2x20, 2# AW on B LE Seated LAQ, 2x20, 2# on B LE Sit<> stand from chair with blue foam pad, 2x10, supervision from SPT, occasional L UE support from seat Ambulation w/ SPC weaving through 5 cones on floor placed 9ft apart, 3 laps back/forth  PATIENT EDUCATION: Education details: PT plan of care; Discussion of symptoms of CVA and to go to ED as soon as possible. Purpose of PT for balance, strength, coordination and endurance.  Person educated: Patient Education method: Explanation Education comprehension: verbalized understanding  HOME EXERCISE PROGRAM: Access Code: [email or web address] URL: [email or web address] Date: 12/17/2023 Prepared by: Sidra Simpers  Program Notes **Be sure to perform all exercises next to a countertop or sturdy piece of furniture in case you become unsteady.  Exercises - Standing Heel Raise with Support  - 1 x daily - 7 x weekly - 2 sets - 15 reps - Standing Toe Raises at Chair  - 1 x daily - 7 x weekly - 2 sets - 15 reps - Mini Squat with Counter Support  - 1 x daily - 7 x weekly - 2 sets - 15 reps - Lunge with Counter Support  - 1 x daily - 7 x weekly - 2 sets - 15 reps - Standing Tandem Balance with Counter Support  - 1 x daily - 7 x weekly - 2 sets - 2 reps - 30 hold - Standing Single Leg Stance with Counter Support  - 1 x daily - 7 x weekly - 2 sets - 2 reps - 30 hold  GOALS: Goals reviewed with patient? Yes  SHORT TERM GOALS: Target date: 01/21/2024  Pt will be independent with HEP in order to improve strength and balance in order to decrease fall risk and improve function at home.  Baseline: EVAL - No current formal HEP in place 03/04/2024- Patient reports independent  with all current HEP and riding a stationary bike.  Goal status: MET   LONG TERM GOALS: Target date: 05/27/2024  1.  Patient will complete five times sit to stand test in < 15 seconds indicating an increased LE strength and improved balance. Baseline: EVAL= 25.20 sec with LUE Support 9/2: 23.36 without UE support and CGA-min assist. 20.10 sec with LUE support 03/04/2024- more difficulty raising without UE but performed 18.75 sec with LUE Support 04/06/24: 21.99s with L UE support on chair rail 12/4:16.59 sec with L UE assist   Goal status: IN PROGRESS   2.  Patient will increase Berg Balance score by > 6 points to demonstrate decreased fall risk during functional activities. Baseline: EVAL: To be assessed next visit; 12/12/2023= 42/56 9/2: 44/56  10/16:45/56 12/4: 47 Goal status: PROGRESSING   3.  Patient will reduce timed up and go to <11 seconds to reduce fall risk and demonstrate improved transfer/gait ability. Baseline: EVAL = 40 sec with RW 9/2: 18.33sec with SPC 10/16:17.47 sec with SPC 04/06/24: 20.17s w/ SPC 12/4:20.86 sec with SPC 16.58 sec with no AD  Goal status: PROGRESSING  4.  Patient will increase 10 meter walk test to >1.25m/s as to improve  gait speed for better community ambulation and to reduce fall risk. Baseline: EVAL: 0.65 m/s 9/2: 0.67 m/s with [facility] 9/9: 0.71 m/s without an AD and wearing 2.5 # AW 10/16: .75 m/s with [facility] 04/06/24: 0.51 m/s 12/4: .675 m/s with SPC, .70 m/s no AD  Goal status: PROGRESSING  5. Patient will increase six minute walk test distance by 200 feet for progression to community ambulator and improve gait ability Baseline: EVAL= 482' 9/2: 465 feet using a SPC with CGA required seated rest break at 4:62min   10/16: 542 ft and full 6 minutes  04/06/24: 525 ft using SPC with CGA, 3 rest breaks taken throughout, @ 2:31 min, 3:32min, and 5:70min 12/4:625 ft total mixed SPC and no SPC initial 300 ft no AD only one standing rest break  allowed around 400 ft, encouraged pt to not rest during test.  Goal status: PROGRESS    ASSESSMENT:  CLINICAL IMPRESSION:  Pt continues with chief complaint of B knee pain during ambulation activities, therefore limiting distance tolerance. Pt was able to complete all activities of today's session with encouragement for standing timed breaks instead of seated breaks between sets. Pt performed ankle weighted // bar ambulation in the  sideways and backwards directions well in today's session. Pt would benefit from continued encouragement and education on benefits of making improvements in therapy sessions. Pt showed good performance of sit <> stands in today's session, and would benefit from increase challenge of strength activities next session. Pt successfully completes cone weaving ambulation with SPC with only 2 cones knocked down during all laps. will continue to benefit from skilled therapy to address remaining deficits in order to improve overall QoL and return to PLOF.   OBJECTIVE IMPAIRMENTS: Abnormal gait, decreased activity tolerance, decreased balance, decreased coordination, decreased endurance, decreased mobility, difficulty walking, decreased ROM, decreased strength, impaired UE functional use, and pain.   ACTIVITY LIMITATIONS: carrying, lifting, bending, sitting, standing, squatting, sleeping, stairs, and transfers  PARTICIPATION LIMITATIONS: meal prep, cleaning, laundry, driving, shopping, community activity, and yard work  PERSONAL FACTORS: 1-2 comorbidities: OA, previous CVA with significant R sided weakness are also affecting patient's functional outcome.   REHAB POTENTIAL: Good  CLINICAL DECISION MAKING: Stable/uncomplicated  EVALUATION COMPLEXITY: Moderate  PLAN:  PT FREQUENCY: 1-2x/week  PT DURATION: 12 weeks  PLANNED INTERVENTIONS: 97164- PT Re-evaluation, 97750- Physical Performance Testing, 97110-Therapeutic exercises, 97530- Therapeutic activity, [id number]-  Neuromuscular re-education, 97535- Self Care, [id number]- Manual therapy, [id number]- Gait training, [phone]- Orthotic Initial, [phone]- Orthotic/Prosthetic subsequent, [phone]- Canalith repositioning, [phone]- Electrical stimulation (manual), [phone] (1-2 muscles), [phone] (3+ muscles)- Dry Needling, Patient/Family education, Balance training, Stair training, Taping, Joint mobilization, Joint manipulation, Spinal manipulation, Spinal mobilization, Compression bandaging, Vestibular training, DME instructions, Cryotherapy, and Moist heat  PLAN FOR NEXT SESSION:  -start planning for DC to independent home program.    Renna Helling, SPT 3:53 PM, 04/22/24

## 2024-04-26 NOTE — Therapy (Signed)
 Outpatient Occupational Therapy Neuro Treatment Note  Patient Name: Erika Stanton MRN: [id number] DOB:16-Jun-1955, 68 y.o., female Today's Date: 04/26/2024  PCP: Laurice Anes, MD REFERRING PROVIDER: Laurice Anes, MD   OT End of Session - 04/26/24 0729     Visit Number 36    Number of Visits 48    Date for Recertification  05/25/24    Authorization Time Period Reporting period beginning 03/30/24    OT Start Time 1400    OT Stop Time 1445    OT Time Calculation (min) 45 min    Equipment Utilized During Treatment transport chair, SPC    Activity Tolerance Patient tolerated treatment well    Behavior During Therapy WFL for tasks assessed/performed         Past Medical History:  Diagnosis Date   Anatomical narrow angle, bilateral    Aneurysm    Asthma    Cerebral aneurysm    Cerebral vasospasm    Cognitive change    Dysphagia, post-stroke    GERD (gastroesophageal reflux disease)    Hemorrhoids    History of cervical dysplasia    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 no recurrent polyps.  suggest repeat surveillance study 11/2022.     COLONOSCOPY W/ POLYPECTOMY  06/2014   Dr Elder at [facility].  3 adenomatoous polyps, anal fissure.     ESOPHAGOGASTRODUODENOSCOPY (EGD) WITH PROPOFOL  N/A 01/27/2020   Procedure: ESOPHAGOGASTRODUODENOSCOPY (EGD) WITH PROPOFOL ;  Surgeon: Shila Gustav GAILS, MD;  Location: [facility] ENDOSCOPY;  Service: Endoscopy;  Laterality: N/A;   INSERTION OF PERMANENT INTRAPERITONEAL CANNULA/CATH N/A    LAPAROSCOPIC   IR 3D  INDEPENDENT WKST  01/06/2020   IR ANGIO INTRA EXTRACRAN SEL INTERNAL CAROTID BILAT MOD SED  01/06/2020   IR ANGIO VERTEBRAL SEL VERTEBRAL UNI L MOD SED  01/06/2020   IR ANGIOGRAM FOLLOW UP STUDY  01/06/2020   IR ANGIOGRAM FOLLOW UP STUDY  01/06/2020   IR ANGIOGRAM FOLLOW UP STUDY  01/06/2020   IR ANGIOGRAM FOLLOW UP STUDY  01/06/2020   IR ANGIOGRAM FOLLOW UP STUDY  01/06/2020   IR ANGIOGRAM FOLLOW UP STUDY  01/06/2020   IR ANGIOGRAM FOLLOW UP STUDY  01/06/2020   IR ANGIOGRAM FOLLOW UP STUDY  01/06/2020   IR ANGIOGRAM FOLLOW UP STUDY  01/06/2020   IR ANGIOGRAM FOLLOW UP STUDY  01/06/2020   IR NEURO EACH ADD'L AFTER BASIC UNI RIGHT (MS)  01/06/2020   IR TRANSCATH/EMBOLIZ  01/06/2020   MASTECTOMY     MOHS SURGERY N/A    RADIOLOGY WITH ANESTHESIA N/A 01/06/2020   Procedure: IR WITH ANESTHESIA FOR ANEURYSM;  Surgeon: Lanis Pupa, MD;  Location: [facility] OR;  Service: Radiology;  Laterality: N/A;   STENT SUPPORTED EMBOLIZATION OF RIGHT ICA ANEURYSM Right    TUBAL LIGATION     VENTRICULO-PERITONEAL SHUNT PLACEMENT / LAPAROSCOPIC INSERTION PERITONEAL CATHETER N/A    Patient Active Problem List   Diagnosis Date Noted   Dyspareunia in female 04/05/2020  History of abnormal cervical Pap smear 03/10/2020   GIB (gastrointestinal bleeding) 02/28/2020   Elevated BUN    Prediabetes    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   Cerebral vasospasm    Sinus tachycardia    Dysphagia, post-stroke    Thrombocytopenia    Acute blood loss anemia    Brain aneurysm    ICH (intracerebral hemorrhage) (HCC) 01/05/2020   History of adenomatous polyp of colon 02/07/2016   Anatomical narrow angle, bilateral 12/14/2014   Fissure in ano 11/11/2014   Hemorrhoids 11/11/2014   Constipation 11/19/2013   GERD (gastroesophageal reflux disease) 03/24/2013   Irritable bowel syndrome with diarrhea 03/24/2013   Herpes zoster 05/14/2005   Abnormal Pap smear of cervix 05/15/1983   History of cervical dysplasia  05/15/1983   ONSET DATE: 12/02/2023  REFERRING DIAG: L ischemic CVA  THERAPY DIAG:  Muscle weakness (generalized)  Other lack of coordination  Rationale for Evaluation and Treatment: Rehabilitation  SUBJECTIVE:  SUBJECTIVE STATEMENT: Pt reports doing well today.   PERTINENT HISTORY:  Pt. was admitted to [facility] from 12/02/23-12/06/23 with an Acute Right MCA CVA, Hydrocephalus with shunt  placement. History of Subarachnoid Hemorrhage with bilateral ICA coiled aneurysms. 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  PRECAUTIONS: None  WEIGHT BEARING RESTRICTIONS: No  PAIN: 04/22/24: No UE pain, 3-4/10 in knees Are you having pain? No  FALLS: Has patient fallen in last 6 months? Yes. Number of falls 1  LIVING ENVIRONMENT: Lives with: lives with their family and lives with their spouse Lives in: House/apartment Stairs: hand rails, 2 steps   Has following equipment at home: [name], cane  PLOF: Independent with basic ADLs, Spouse help as needed  PATIENT GOALS: Strengthening  OBJECTIVE:  Note: Objective measures were completed at Evaluation unless otherwise noted.  HAND DOMINANCE: Left  ADLs: Overall ADLs: spouse assists when needed, Pt. reports that she can do most tasks herself. Uses her left hand to engage in self-care tasks. Eating: Able to cut foods with a fork, husband assists as needed.  Grooming: husband assists with applying toothpaste, able to brush teeth and hair UB Dressing: Spouse assists with applying/fastening bra, Pt. Requires assistance with zippers, and buttons. LB Dressing: Spouse assists with tying shoes when needed, uses slip on shoes. Pt. Does not zip pants. Toileting: Able to complete  independently Bathing: Pt. Requires intermittent assistance with bathing tasks. Tub Shower transfers: Spouse assists with transfers when/if needed Equipment: Shower seat with back  IADLs: Shopping: Spouse typically does the shopping, isnt able to carry heavy objects.  Light housekeeping: Spouse typically does most of the cleaning around the home, Pt. Cleans bathroom, and laundry Pt. Is unable to carry laundry due to exacerbation of weakness.  Meal Prep: Spouse typically cooks and prepares meals.  Community mobility: Pt. Requires assistance using walker. Pt. reports that she is unable to carry heavy items, and typically does not do the shopping.  Medication management: Pt. is able to take care of meds with pillbox Financial management:  Spouse takes care of it, and makes online payments.  Handwriting: 50% legible  MOBILITY STATUS: Needs Assist: Pt. Requires use of a walker   POSTURE COMMENTS:  No Significant postural limitations Sitting balance: Good  ACTIVITY TOLERANCE: Activity tolerance:   FUNCTIONAL OUTCOME MEASURES: TBD  UPPER EXTREMITY ROM:    Active ROM Right eval Right 01/16/24 Right 03/02/24 Right 04/01/24  Left eval Left 01/16/24 Left 03/02/24 Left 04/01/24  Shoulder flexion 94(121) 100 with scaption (132) 126(146) 100 some scaption (145) 102(125) 134 with scaption (145) 142(148) 140 (155)  Shoulder abduction 75(106) 74 (98) 93(120) 58(90) 110(126) 140 (170) WNL 165 (180)  Shoulder adduction          Shoulder extension          Shoulder internal rotation          Shoulder external rotation          Elbow flexion 135(155)  150(155)  140(150)  150   Elbow extension 0  0  -10(-9)  -4(0)   Wrist flexion 40(44)  48(60)  42(58)  52(60)   Wrist extension 30(34)  48(60)  40(50)  52(56)   Wrist ulnar deviation          Wrist radial deviation          Wrist pronation          Wrist supination          (Blank rows = not tested)  UPPER EXTREMITY MMT:     MMT  Right eval Right 03/02/24 Right 04/01/24 Left eval Left 03/02/24 Left 04/01/24  Shoulder flexion 3-/5 3+/5 3- 3-/5 4/5 4/5  Shoulder abduction 3-/5 3-/5 3- 3-/5 4/5 4/5  Shoulder adduction        Shoulder extension        Shoulder internal rotation        Shoulder external rotation        Middle trapezius        Lower trapezius        Elbow flexion 4-/5 4+/5 4+ 4+/5 5/5 5/5  Elbow extension 4-/5 4+/5 4/5 4+/5 5/5 5/5  Wrist flexion 4-/5 4-/5 4/5 3+/5 4/5 4+/5  Wrist extension 4-/5 4-/5 4- 3+/5 4/5 4+/5  Wrist ulnar deviation        Wrist radial deviation        Wrist pronation        Wrist supination        (Blank rows = not tested)  HAND FUNCTION Eval:  Grip strength: Right: N/T lbs; Left: 43 lbs, Lateral pinch: Right: N/T lbs, Left: 9 lbs, and 3 point pinch: Right: N/T lbs, Left: 5 lbs Pt. Presents with flexor tightness in the right 5th digit, however is able to passively extend 5th digit within normal range. Pt. Also Presents with hyper extension in the 2nd and 3rd digit at PIP joints. Pt. Is able to achieve full Digit MP, PIP, and DIP PROM.  01/16/24: Grip strength: Left: 33 lbs; Lateral pinching: Left: 8 lbs; 3 point pinch: Left: 3 lbs  03/02/24: Grip strength: Left: 36 lbs; Lateral pinching: Left: 8 lbs; 3 point pinch: Left: 3 lbs  04/01/24: Grip strength: Left: (3 trials) 32 lbs, 26 lbs, 34 lbs; Lateral pinch: Pinch gauge unavailable (out for re-calibration)  COORDINATION: 9 Hole Peg test: Right: N/T sec; Left: 51 sec 01/16/24: Left: 1 min 1 sec 03/02/24:  Left: 1 min 1 sec 04/01/24: Left: 58 sec  SENSATION: Not tested  EDEMA:   MUSCLE TONE: Fluctuating flexor tone in R hand.  COGNITION: Overall cognitive status: Within functional limits for tasks assessed  VISION: Subjective report:  Baseline vision:  Visual history:   VISION ASSESSMENT:  PERCEPTION:   PRAXIS:   OBSERVATIONS: Pt. presents with fluctuating flexor tone in the  R hand, involving the 5th  digit at the PIP/DIP joint. Pt. presents with hyperextension at the 2nd and 3rd digits at the PIP joints.                                                                                                                    TREATMENT DATE: 04/22/24 -Practiced strategies for zipping/unzipping purse and removing desired items from wallet and purse using R hand as a stabilizer.  -Reviewed positioning with purse strap over shoulder vs around neck (min vc for recall of this strategy), purse in lap vs hanging between legs to widen purse opening for easier access.   -Participation in handwriting tasks to sign/print name for receipts and other official documents using L hand, standard pen, and lined paper.  OT provided intermittent vc/tc and visual demo for pen set up in L hand.   -Practiced variety of paper positions on table top for optimal writing angle using L hand, and provided min vc-min A for stabilizing note pad with R hand/forearm.   -Practiced signing with paper on clipboard in lap to simulate scenarios within medical offices; min vc for positioning RUE as an effective stabilizer.  -Provided designated small spaces with a highlighted line to simulate signing name or initials on a form or small receipt -Practiced writing phone number with paper/pen on paper, transitioning to white board with thick marker, transitioning back to paper/pen once numbers were legible on whiteboard.  -Positioned pt without lateral support on mat table to challenge core stability and promote upright posture for handwriting tasks.  Feet supported on floor while pt used side table for handwriting.      PATIENT EDUCATION: Education details: handwriting strategies and positioning to remove and return items from purse and wallet Person educated: Patient Education method: Explanation and Verbal cues Education comprehension: verbal cues required  HOME EXERCISE PROGRAM: -Pink theraputty; visual handout issued -Self and caregiver  assisted PROM to R hand and wrist -Monitor splint with skin checks  GOALS: Goals reviewed with patient? Yes  SHORT TERM GOALS: Target date: 04/13/2024   Pt. Will be independent with HEP for UE functioning. Baseline: Eval: No current HEP; 01/16/24: pt reports doing a little writing and squeezing a ball, but denies using putty; spouse stretches RUE daily 02/20/24:  Pt. Continues to do a little writing and squeezing a ball, but denies using putty; spouse stretches RUE 03/02/24: Pt. Husband assists Pt. With ROM to the right hand. Pt. reports doing hand exercises; 03/30/24: Spouse indep with HEP (spouse assists daily) Goal status: achieved  LONG TERM GOALS: Target date: 05/25/2024  Pt. Will improve [facility] skills to be able to manipulate small objects at home Baseline: 9 hole peg test: L: 51 seconds; 01/16/24: L 1 min 1 sec, 03/02/24: Left: 1 min. & 1 sec. Pt. Continues to work towards using the left to manipulate small objects; 04/01/24: L 58 sec (multiple trials to improve time) Goal status: Ongoing  2.  Pt. Will increase BUE shoulder ROM by 10 degrees  to be able to reach into cabinets and shelves.  Baseline: Shoulder flexion: R: 94(121), L: 102(125), Shoulder abduction: R: 75(106), L: 110(126); 01/16/24: Shoulder flexion: R: 100 with scaption (132), L: 134 with scaption (145), Shoulder abduction: R: 74 (98), L: 140 (170)02/20/24: Pt. Is improving with functional reaching through multiple planes with cues, and assist required. 03/02/24: Shoulder flexion: R: 126(146), L: 142(148), Shoulder abduction: R: 93(120), L: WNL; 03/30/24: ROM to be updated next session; limited by time constraints with poc review, though pt reports she does not reach into cabinets; 04/01/24: Shoulder flexion: R: 100 (145), L: 140 (155); Shoulder abd: R: 50 (90), L: 165 (180); pt reports she does not reach into cabinets Goal status: Ongoing  3.  Pt. Will improve L grip strength by 5# to be able to securely grasp ADL items at home.   Baseline: Grip strength: R: NT, L: 43#; 01/16/24: L: 33#  03/02/24: 36#; 04/01/24: L: 34 lbs (best of 3 trials) Goal status: Ongoing  4.  Pt. Will increase L lateral key pinch strength by 3#  to open wide mouth jars.  Baseline: Lateral key pinch: R: NT, L: 9#; 01/16/24: L: 8# 03/02/24: Pinch meter is out for calibration. Pt. Continues to have difficulty opening jars; 04/01/24: NT (pinch gauge not available); continued difficulty opening jars Goal status: Ongoing  5.  Pt. Will independently engage the right hand as a gross assist to the left hand 100% of the time during ADLs/IADLs. Baseline: Eval: Pt. With limited engagement of the right hand as a gross assist during tasks; 01/16/24: Observed <25% of the time during OT sessions 02/20/24: Pt. Continues to present with limited engagement of the RUE as a gross assist to the left hand approximately 25% of the time with cues. 03/02/24: Pt. Is starting to engage her right hand more as a gross assist to the left; 04/01/24: Pt engages the R hand as a fair assist to the L with intermittent vc from OT during tx sessions; pt admits she needs to use the R hand more at home. Goal status: Ongoing  6.  Pt. Will be able to manipulate zippers, and buttons efficiently with modified independence Baseline: Eval:Right: NT Left: 51 sec; 01/16/24: Left: 1 min and 1 sec 02/20/24: Pt. continues to have difficulty manipulating zippers and buttons.03/02/24: Pt. continues to have difficulty manipulating zippers, and buttons, and may benefit from reviewing adaptive devices; 04/01/24: Pt reports that she continues to avoid clothes with fasteners Goal status: Ongoing   7. Pt. Will improve handwriting legibility to 100% for one sentence in printed form for written correspondence.  Baseline: 50% legible in printed form for first name only; 01/16/24: 80-90% legible to print first name 1 of 4 attempts.  3 of 4 attempts 50% or less legible 02/20/24: first name only 100%, 25% for lists of words.  03/02/24:  first name only 100%, 25% for lists of words; 04/01/24: 100% legibility for a short 3 word sentence using L hand.  Goal status: achieved  8. Pt. Will independently, and efficiently transfer small 1/2 objects from the left hand to the right hand in midline in preparation from reaching up  to place items at a target with 100% accuracy with the right hand. Baseline: 03/02/24: Pt. Has progressed to transferring 1.5 objects between her hand in midline, and reach up to place them at targets with 60% accuracy; 03/30/24: Pt can transfer jumbo pegs and clothespins (~1/5) from R to L hand with extra time, repeat trials, and occasional dropping. Pt  continues to reach toward a target with ~60% accuracy.  Goal Status: ongoing  9. Pt. Will increased BUE strength by 2 mm grades to assist with ADLs, and IADLs.  Baseline: 03/02/24: UE strength: Shoulder flexion: R: 3+/5 L: 4/5, abduction: R: 3-/5, L 4/5:  elbow flexion: R:4+/5, L: 5/5, extension: R: 4+/5, L:5/5, wrist flexion: R: 4-/5  L: 4/5 wrist extension: R: 4-/5 L: 4/5; 04/01/24: see chart; no significant changes  Goal Status: New: 03/02/24  ASSESSMENT: CLINICAL IMPRESSION: Continued focus on accessing items in purse and wallet, requiring min vc for recall of successful strategies that were practiced in a previous session.  Pt required continued assist for pen set up in L hand, and occasional min A to stabilize paper on table top as R hand and forearm tends to slide from position.  Pt was able to sign name within designated smaller spaces with increased time.  Pt initially struggled to formulate a few numbers within her phone number when attempting first on paper.  Transitioned to white board and provided visual demo for pt to copy and complete reps of large numbers, eventually able to transition back to paper/pen with 90% legibility to print phone number.  Pt will continue to benefit from skilled OT to address above noted goals, working towards  increased indep with daily tasks and reduced burden of care on caregiver.   PERFORMANCE DEFICITS: in functional skills including ADLs, IADLs, coordination, dexterity, edema, tone, ROM, strength, pain, Fine motor control, Gross motor control, endurance, and UE functional use, and psychosocial skills including coping strategies, environmental adaptation, habits, and routines and behaviors.   IMPAIRMENTS: are limiting patient from ADLs, IADLs, rest and sleep, leisure, and social participation.   CO-MORBIDITIES: may have co-morbidities  that affects occupational performance. Patient will benefit from skilled OT to address above impairments and improve overall function.  MODIFICATION OR ASSISTANCE TO COMPLETE EVALUATION: Min-Moderate modification of tasks or assist with assess necessary to complete an evaluation.  OT OCCUPATIONAL PROFILE AND HISTORY: Detailed assessment: Review of records and additional review of physical, cognitive, psychosocial history related to current functional performance.  CLINICAL DECISION MAKING: Moderate - several treatment options, min-mod task modification necessary  REHAB POTENTIAL: Good  EVALUATION COMPLEXITY: Moderate  PLAN:  OT FREQUENCY: 2x/week  OT DURATION: 12 weeks  PLANNED INTERVENTIONS: 97168 OT Re-evaluation, 97535 self care/ADL training, 02889 therapeutic exercise, 97530 therapeutic activity, 97112 neuromuscular re-education, 97140 manual therapy, 97018 paraffin, 02960 fluidotherapy, 97010 moist heat, 97010 cryotherapy, 97034 contrast bath, 97032 electrical stimulation (manual), 97760 Orthotic Initial, passive range of motion, energy conservation, coping strategies training, patient/family education, and DME and/or AE instructions  RECOMMENDED OTHER SERVICES: PT   CONSULTED AND AGREED WITH PLAN OF CARE: Patient  PLAN FOR NEXT SESSION: see above  Inocente Blazing, MS, OTR/L

## 2024-04-27 ENCOUNTER — Encounter: Admitting: Speech Pathology

## 2024-04-27 ENCOUNTER — Encounter: Payer: Self-pay | Admitting: Physical Therapy

## 2024-04-27 ENCOUNTER — Ambulatory Visit: Admitting: Physical Therapy

## 2024-04-27 ENCOUNTER — Ambulatory Visit

## 2024-04-27 DIAGNOSIS — R41841 Cognitive communication deficit: Secondary | ICD-10-CM

## 2024-04-27 DIAGNOSIS — R262 Difficulty in walking, not elsewhere classified: Secondary | ICD-10-CM

## 2024-04-27 DIAGNOSIS — R278 Other lack of coordination: Secondary | ICD-10-CM

## 2024-04-27 DIAGNOSIS — M6281 Muscle weakness (generalized): Secondary | ICD-10-CM

## 2024-04-27 DIAGNOSIS — R2689 Other abnormalities of gait and mobility: Secondary | ICD-10-CM

## 2024-04-27 DIAGNOSIS — R296 Repeated falls: Secondary | ICD-10-CM

## 2024-04-27 DIAGNOSIS — R269 Unspecified abnormalities of gait and mobility: Secondary | ICD-10-CM

## 2024-04-27 NOTE — Therapy (Unsigned)
 OUTPATIENT PHYSICAL THERAPY TREATMENT   Patient Name: MARKEISHA MANCIAS MRN: [id number] DOB:1955/12/09, 68 y.o., female Today's Date: 04/27/2024  PCP: Dr. Oneil Galloway  REFERRING PROVIDER: Dr. Oneil Galloway  END OF SESSION:   PT End of Session - 04/27/24 1738     Visit Number 35    Number of Visits 45    Date for Recertification  05/27/24    Authorization Type [employer]    Progress Note Due on Visit 40    PT Start Time 1531    PT Stop Time 1615    PT Time Calculation (min) 44 min    Equipment Utilized During Treatment Gait belt    Activity Tolerance Patient tolerated treatment well    Behavior During Therapy WFL for tasks assessed/performed               Past Medical History:  Diagnosis Date   Anatomical narrow angle, bilateral    Aneurysm    Asthma    Cerebral aneurysm    Cerebral vasospasm    Cognitive change    Dysphagia, post-stroke    GERD (gastroesophageal reflux disease)    Hemorrhoids    History of cervical dysplasia    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 no recurrent polyps.  suggest repeat surveillance study 11/2022.     COLONOSCOPY W/ POLYPECTOMY  06/2014   Dr Elder at [facility].  3 adenomatoous polyps, anal fissure.     ESOPHAGOGASTRODUODENOSCOPY (EGD) WITH PROPOFOL  N/A 01/27/2020   Procedure: ESOPHAGOGASTRODUODENOSCOPY (EGD) WITH PROPOFOL ;  Surgeon: Shila Gustav GAILS, MD;  Location: [facility] END[facility]COPY;  Service: Endoscopy;  Laterality: N/A;   INSERTION OF PERMANENT INTRAPERITONEAL CANNULA/CATH N/A    LAPAROSCOPIC   IR  3D INDEPENDENT WKST  01/06/2020   IR ANGIO INTRA EXTRACRAN SEL INTERNAL CAROTID BILAT MOD SED  01/06/2020   IR ANGIO VERTEBRAL SEL VERTEBRAL UNI L MOD SED  01/06/2020   IR ANGIOGRAM FOLLOW UP STUDY  01/06/2020   IR ANGIOGRAM FOLLOW UP STUDY  01/06/2020   IR ANGIOGRAM FOLLOW UP STUDY  01/06/2020   IR ANGIOGRAM FOLLOW UP STUDY  01/06/2020   IR ANGIOGRAM FOLLOW UP STUDY  01/06/2020   IR ANGIOGRAM FOLLOW UP STUDY  01/06/2020   IR ANGIOGRAM FOLLOW UP STUDY  01/06/2020   IR ANGIOGRAM FOLLOW UP STUDY  01/06/2020   IR ANGIOGRAM FOLLOW UP STUDY  01/06/2020   IR ANGIOGRAM FOLLOW UP STUDY  01/06/2020   IR NEURO EACH ADD'L AFTER BASIC UNI RIGHT (MS)  01/06/2020   IR TRANSCATH/EMBOLIZ  01/06/2020   MASTECTOMY     MOHS SURGERY N/A    RADIOLOGY WITH ANESTHESIA N/A 01/06/2020   Procedure: IR WITH ANESTHESIA FOR ANEURYSM;  Surgeon: Lanis Pupa, MD;  Location: [facility] OR;  Service: Radiology;  Laterality: N/A;   STENT SUPPORTED EMBOLIZATION OF RIGHT ICA ANEURYSM Right    TUBAL LIGATION     VENTRICULO-PERITONEAL SHUNT PLACEMENT / LAPAROSCOPIC INSERTION PERITONEAL CATHETER N/A    Patient Active Problem  List   Diagnosis Date Noted   Dyspareunia in female 04/05/2020   History of abnormal cervical Pap smear 03/10/2020   GIB (gastrointestinal bleeding) 02/28/2020   Elevated BUN    Prediabetes    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   Cerebral vasospasm    Sinus tachycardia    Dysphagia, post-stroke    Thrombocytopenia    Acute blood loss anemia    Brain aneurysm    ICH (intracerebral hemorrhage) (HCC) 01/05/2020   History of adenomatous polyp of colon 02/07/2016   Anatomical narrow angle, bilateral 12/14/2014   Fissure in ano 11/11/2014   Hemorrhoids 11/11/2014   Constipation 11/19/2013   GERD (gastroesophageal reflux disease) 03/24/2013   Irritable bowel syndrome with diarrhea 03/24/2013   Herpes zoster 05/14/2005   Abnormal Pap smear of cervix 05/15/1983   History of cervical  dysplasia 05/15/1983    ONSET DATE: 12/02/2023  REFERRING DIAG: I63.9 (ICD-10-CM) - CVA (cerebral vascular accident) (HCC)   THERAPY DIAG:  Other lack of coordination  Cognitive communication deficit  Difficulty in walking, not elsewhere classified  Repeated falls  Abnormality of gait and mobility  Muscle weakness (generalized)  Other abnormalities of gait and mobility  Rationale for Evaluation and Treatment: Rehabilitation  SUBJECTIVE:                                                                                                                                                                                             SUBJECTIVE STATEMENT: Pt reports feeling okay today, but that she just has no confidence in herself.    PERTINENT HISTORY:   Patient reports she was in hospital from [facility]- Sat (July 21st- 25th) with CVA affecting left side. I was supposed to have a knee replacement surgery in September but that is on hold for now. I was doing my prep for my colonoscopy and fell in the bathroom.  Patient reports having mild left sided weakness compared to her CVA in 2021 with heavy Right sided UE weakness. She reports bad OA affecting both knees as well. Previous CVA in 2021 affecting Right side. Reports going to ED  last Monday due to fall/CVA affecting Left side. Diagnosed with R CVA with Left Sided weakness.    PAIN:  Are you having pain? B pain knees, 3/10  PRECAUTIONS: Fall  WEIGHT BEARING RESTRICTIONS: No  FALLS: Has patient fallen in last 6 months? Yes. Number of falls fell with the stroke  PATIENT GOALS: Walk without any assistive device.  OBJECTIVE:  Note: Objective measures were completed at evaluation unless otherwise noted.  TREATMENT DATE : 04/27/2024  Nustep, rolling hill program, 8 min, levels 3-5, seat 9, arms 9 seated GTB side  step outs, 2x20 seated LAQ, 3# AW on B LE, 3x20 standing alternating taps onto first step on staircase, 4x10, L UE support on rail 146ft ambulation with 2# AW on B LE, supervision/CGA from SPT 10x sit<>stand seated break 167ft ambulation with 2# AW on B LE, supervision/CGA from SPT 10x sit<>stand seated break 138ft ambulation with 2# AW on B LE with sidestepping at halfway point of bout, supervision/CGA from SPT   PATIENT EDUCATION: Education details: PT plan of care; Discussion of symptoms of CVA and to go to ED as soon as possible. Purpose of PT for balance, strength, coordination and endurance.  Person educated: Patient Education method: Explanation Education comprehension: verbalized understanding  HOME EXERCISE PROGRAM: Access Code: [email or web address] URL: [email or web address] Date: 12/17/2023 Prepared by: Sidra Simpers  Program Notes **Be sure to perform all exercises next to a countertop or sturdy piece of furniture in case you become unsteady.  Exercises - Standing Heel Raise with Support  - 1 x daily - 7 x weekly - 2 sets - 15 reps - Standing Toe Raises at Chair  - 1 x daily - 7 x weekly - 2 sets - 15 reps - Mini Squat with Counter Support  - 1 x daily - 7 x weekly - 2 sets - 15 reps - Lunge with Counter Support  - 1 x daily - 7 x weekly - 2 sets - 15 reps - Standing Tandem Balance with Counter Support  - 1 x daily - 7 x weekly - 2 sets - 2 reps - 30 hold - Standing Single Leg Stance with Counter Support  - 1 x daily - 7 x weekly - 2 sets - 2 reps - 30 hold  GOALS: Goals reviewed with patient? Yes  SHORT TERM GOALS: Target date: 01/21/2024  Pt will be independent with HEP in order to improve strength and balance in order to decrease fall risk and improve function at home.  Baseline: EVAL - No current formal HEP in place 03/04/2024- Patient reports independent with all current HEP and riding a stationary bike.  Goal status: MET   LONG TERM GOALS:  Target date: 05/27/2024  1.  Patient will complete five times sit to stand test in < 15 seconds indicating an increased LE strength and improved balance. Baseline: EVAL= 25.20 sec with LUE Support 9/2: 23.36 without UE support and CGA-min assist. 20.10 sec with LUE support 03/04/2024- more difficulty raising without UE but performed 18.75 sec with LUE Support 04/06/24: 21.99s with L UE support on chair rail 12/4:16.59 sec with L UE assist   Goal status: IN PROGRESS   2.  Patient will increase Berg Balance score by > 6 points to demonstrate decreased fall risk during functional activities. Baseline: EVAL: To be assessed next visit; 12/12/2023= 42/56 9/2: 44/56  10/16:45/56 12/4: 47 Goal status: PROGRESSING   3.  Patient will reduce timed up and go to <11 seconds to reduce fall risk and demonstrate improved transfer/gait ability. Baseline: EVAL = 40 sec with RW 9/2: 18.33sec with SPC 10/16:17.47 sec with SPC 04/06/24: 20.17s w/ SPC 12/4:20.86 sec with SPC 16.58 sec with no AD  Goal status: PROGRESSING  4.  Patient will increase 10 meter walk test to >1.16m/s as to improve gait speed for better community ambulation and to reduce fall risk. Baseline: EVAL: 0.65 m/s 9/2: 0.67 m/s with SPC 9/9: 0.71 m/s  without an AD and wearing 2.5 # AW 10/16: .75 m/s with [facility] 04/06/24: 0.51 m/s 12/4: .675 m/s with SPC, .70 m/s no AD  Goal status: PROGRESSING  5. Patient will increase six minute walk test distance by 200 feet for progression to community ambulator and improve gait ability Baseline: EVAL= 482' 9/2: 465 feet using a SPC with CGA required seated rest break at 4:90min   10/16: 542 ft and full 6 minutes  04/06/24: 525 ft using SPC with CGA, 3 rest breaks taken throughout, @ 2:31 min, 3:63min, and 5:18min 12/4:625 ft total mixed SPC and no SPC initial 300 ft no AD only one standing rest break allowed around 400 ft, encouraged pt to not rest during test.  Goal status: PROGRESS     ASSESSMENT:  CLINICAL IMPRESSION:  Pt arrived to session in [facility] from OT appointment. Pt continues to show self-limiting behavior, decreased confidence, and B knee pain limiting activity. Pt continues to need significant encouragement to complete activities she has previously performed, making progression difficult. Continued complaint of B knee pain persists, making standing activities difficult as well. Began with Nustep and seated hip/knee musculature strengthening exercises to begin with unweighted activities as to not increase knee pain. Pt reported difficulty with 3# AW for alternating steps initially, so weight was removed for remainder of activity. Pt showed good ability with ambulation with 2# AW and sit<>stand circuits, but still requested and required seated rest breaks between bouts. Pt reported difficulty with sidestepping in hallway and ceased activity. Pt will continue to benefit from skilled therapy to address remaining deficits in order to improve overall QoL and return to PLOF.   OBJECTIVE IMPAIRMENTS: Abnormal gait, decreased activity tolerance, decreased balance, decreased coordination, decreased endurance, decreased mobility, difficulty walking, decreased ROM, decreased strength, impaired UE functional use, and pain.   ACTIVITY LIMITATIONS: carrying, lifting, bending, sitting, standing, squatting, sleeping, stairs, and transfers  PARTICIPATION LIMITATIONS: meal prep, cleaning, laundry, driving, shopping, community activity, and yard work  PERSONAL FACTORS: 1-2 comorbidities: OA, previous CVA with significant R sided weakness are also affecting patient's functional outcome.   REHAB POTENTIAL: Good  CLINICAL DECISION MAKING: Stable/uncomplicated  EVALUATION COMPLEXITY: Moderate  PLAN:  PT FREQUENCY: 1-2x/week  PT DURATION: 12 weeks  PLANNED INTERVENTIONS: 97164- PT Re-evaluation, 97750- Physical Performance Testing, 97110-Therapeutic exercises, 97530- Therapeutic activity,  [id number]- Neuromuscular re-education, 97535- Self Care, [id number]- Manual therapy, [id number]- Gait training, [phone]- Orthotic Initial, [phone]- Orthotic/Prosthetic subsequent, [phone]- Canalith repositioning, [phone]- Electrical stimulation (manual), [phone] (1-2 muscles), [id number] (3+ muscles)- Dry Needling, Patient/Family education, Balance training, Stair training, Taping, Joint mobilization, Joint manipulation, Spinal manipulation, Spinal mobilization, Compression bandaging, Vestibular training, DME instructions, Cryotherapy, and Moist heat  PLAN FOR NEXT SESSION:  -continue ambulation distance and activity tolerance -general LE strengthening    Renna Helling, SPT 5:39 PM, 04/27/2024

## 2024-04-28 NOTE — Therapy (Signed)
 Outpatient Occupational Therapy Neuro Treatment Note  Patient Name: Erika Stanton MRN: [id number] DOB:20-Jan-1956, 68 y.o., female Today's Date: 04/28/2024  PCP: Laurice Anes, MD REFERRING PROVIDER: Laurice Anes, MD   OT End of Session - 04/28/24 2032     Visit Number 37    Number of Visits 48    Date for Recertification  05/25/24    Authorization Time Period Reporting period beginning 03/30/24    OT Start Time 1445    OT Stop Time 1530    OT Time Calculation (min) 45 min    Equipment Utilized During Treatment transport chair, SPC    Activity Tolerance Patient tolerated treatment well    Behavior During Therapy WFL for tasks assessed/performed         Past Medical History:  Diagnosis Date   Anatomical narrow angle, bilateral    Aneurysm    Asthma    Cerebral aneurysm    Cerebral vasospasm    Cognitive change    Dysphagia, post-stroke    GERD (gastroesophageal reflux disease)    Hemorrhoids    History of cervical dysplasia    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 no recurrent polyps.  suggest repeat surveillance study 11/2022.     COLONOSCOPY W/ POLYPECTOMY  06/2014   Dr Elder at [facility].  3 adenomatoous polyps, anal fissure.     ESOPHAGOGASTRODUODENOSCOPY (EGD) WITH PROPOFOL  N/A 01/27/2020   Procedure: ESOPHAGOGASTRODUODENOSCOPY (EGD) WITH PROPOFOL ;  Surgeon: Shila Gustav GAILS, MD;  Location: [facility] ENDOSCOPY;  Service: Endoscopy;  Laterality: N/A;   INSERTION OF PERMANENT INTRAPERITONEAL CANNULA/CATH N/A    LAPAROSCOPIC   IR 3D  INDEPENDENT WKST  01/06/2020   IR ANGIO INTRA EXTRACRAN SEL INTERNAL CAROTID BILAT MOD SED  01/06/2020   IR ANGIO VERTEBRAL SEL VERTEBRAL UNI L MOD SED  01/06/2020   IR ANGIOGRAM FOLLOW UP STUDY  01/06/2020   IR ANGIOGRAM FOLLOW UP STUDY  01/06/2020   IR ANGIOGRAM FOLLOW UP STUDY  01/06/2020   IR ANGIOGRAM FOLLOW UP STUDY  01/06/2020   IR ANGIOGRAM FOLLOW UP STUDY  01/06/2020   IR ANGIOGRAM FOLLOW UP STUDY  01/06/2020   IR ANGIOGRAM FOLLOW UP STUDY  01/06/2020   IR ANGIOGRAM FOLLOW UP STUDY  01/06/2020   IR ANGIOGRAM FOLLOW UP STUDY  01/06/2020   IR ANGIOGRAM FOLLOW UP STUDY  01/06/2020   IR NEURO EACH ADD'L AFTER BASIC UNI RIGHT (MS)  01/06/2020   IR TRANSCATH/EMBOLIZ  01/06/2020   MASTECTOMY     MOHS SURGERY N/A    RADIOLOGY WITH ANESTHESIA N/A 01/06/2020   Procedure: IR WITH ANESTHESIA FOR ANEURYSM;  Surgeon: Lanis Pupa, MD;  Location: [facility] OR;  Service: Radiology;  Laterality: N/A;   STENT SUPPORTED EMBOLIZATION OF RIGHT ICA ANEURYSM Right    TUBAL LIGATION     VENTRICULO-PERITONEAL SHUNT PLACEMENT / LAPAROSCOPIC INSERTION PERITONEAL CATHETER N/A    Patient Active Problem List   Diagnosis Date Noted   Dyspareunia in female 04/05/2020  History of abnormal cervical Pap smear 03/10/2020   GIB (gastrointestinal bleeding) 02/28/2020   Elevated BUN    Prediabetes    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   Cerebral vasospasm    Sinus tachycardia    Dysphagia, post-stroke    Thrombocytopenia    Acute blood loss anemia    Brain aneurysm    ICH (intracerebral hemorrhage) (HCC) 01/05/2020   History of adenomatous polyp of colon 02/07/2016   Anatomical narrow angle, bilateral 12/14/2014   Fissure in ano 11/11/2014   Hemorrhoids 11/11/2014   Constipation 11/19/2013   GERD (gastroesophageal reflux disease) 03/24/2013   Irritable bowel syndrome with diarrhea 03/24/2013   Herpes zoster 05/14/2005   Abnormal Pap smear of cervix 05/15/1983   History of cervical dysplasia  05/15/1983   ONSET DATE: 12/02/2023  REFERRING DIAG: L ischemic CVA  THERAPY DIAG:  Muscle weakness (generalized)  Other lack of coordination  Rationale for Evaluation and Treatment: Rehabilitation  SUBJECTIVE:  SUBJECTIVE STATEMENT: Pt reports doing well today.   PERTINENT HISTORY:  Pt. was admitted to [facility] from 12/02/23-12/06/23 with an Acute Right MCA CVA, Hydrocephalus with shunt  placement. History of Subarachnoid Hemorrhage with bilateral ICA coiled aneurysms. 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  PRECAUTIONS: None  WEIGHT BEARING RESTRICTIONS: No  PAIN: 04/27/24: No UE pain, 3-4/10 in knees Are you having pain? No  FALLS: Has patient fallen in last 6 months? Yes. Number of falls 1  LIVING ENVIRONMENT: Lives with: lives with their family and lives with their spouse Lives in: House/apartment Stairs: hand rails, 2 steps   Has following equipment at home: [name], cane  PLOF: Independent with basic ADLs, Spouse help as needed  PATIENT GOALS: Strengthening  OBJECTIVE:  Note: Objective measures were completed at Evaluation unless otherwise noted.  HAND DOMINANCE: Left  ADLs: Overall ADLs: spouse assists when needed, Pt. reports that she can do most tasks herself. Uses her left hand to engage in self-care tasks. Eating: Able to cut foods with a fork, husband assists as needed.  Grooming: husband assists with applying toothpaste, able to brush teeth and hair UB Dressing: Spouse assists with applying/fastening bra, Pt. Requires assistance with zippers, and buttons. LB Dressing: Spouse assists with tying shoes when needed, uses slip on shoes. Pt. Does not zip pants. Toileting: Able to complete  independently Bathing: Pt. Requires intermittent assistance with bathing tasks. Tub Shower transfers: Spouse assists with transfers when/if needed Equipment: Shower seat with back  IADLs: Shopping: Spouse typically does the shopping, isnt able to carry heavy objects.  Light housekeeping: Spouse typically does most of the cleaning around the home, Pt. Cleans bathroom, and laundry Pt. Is unable to carry laundry due to exacerbation of weakness.  Meal Prep: Spouse typically cooks and prepares meals.  Community mobility: Pt. Requires assistance using walker. Pt. reports that she is unable to carry heavy items, and typically does not do the shopping.  Medication management: Pt. is able to take care of meds with pillbox Financial management:  Spouse takes care of it, and makes online payments.  Handwriting: 50% legible  MOBILITY STATUS: Needs Assist: Pt. Requires use of a walker   POSTURE COMMENTS:  No Significant postural limitations Sitting balance: Good  ACTIVITY TOLERANCE: Activity tolerance:   FUNCTIONAL OUTCOME MEASURES: TBD  UPPER EXTREMITY ROM:    Active ROM Right eval Right 01/16/24 Right 03/02/24 Right 04/01/24  Left eval Left 01/16/24 Left 03/02/24 Left 04/01/24  Shoulder flexion 94(121) 100 with scaption (132) 126(146) 100 some scaption (145) 102(125) 134 with scaption (145) 142(148) 140 (155)  Shoulder abduction 75(106) 74 (98) 93(120) 58(90) 110(126) 140 (170) WNL 165 (180)  Shoulder adduction          Shoulder extension          Shoulder internal rotation          Shoulder external rotation          Elbow flexion 135(155)  150(155)  140(150)  150   Elbow extension 0  0  -10(-9)  -4(0)   Wrist flexion 40(44)  48(60)  42(58)  52(60)   Wrist extension 30(34)  48(60)  40(50)  52(56)   Wrist ulnar deviation          Wrist radial deviation          Wrist pronation          Wrist supination          (Blank rows = not tested)  UPPER EXTREMITY MMT:     MMT  Right eval Right 03/02/24 Right 04/01/24 Left eval Left 03/02/24 Left 04/01/24  Shoulder flexion 3-/5 3+/5 3- 3-/5 4/5 4/5  Shoulder abduction 3-/5 3-/5 3- 3-/5 4/5 4/5  Shoulder adduction        Shoulder extension        Shoulder internal rotation        Shoulder external rotation        Middle trapezius        Lower trapezius        Elbow flexion 4-/5 4+/5 4+ 4+/5 5/5 5/5  Elbow extension 4-/5 4+/5 4/5 4+/5 5/5 5/5  Wrist flexion 4-/5 4-/5 4/5 3+/5 4/5 4+/5  Wrist extension 4-/5 4-/5 4- 3+/5 4/5 4+/5  Wrist ulnar deviation        Wrist radial deviation        Wrist pronation        Wrist supination        (Blank rows = not tested)  HAND FUNCTION Eval:  Grip strength: Right: N/T lbs; Left: 43 lbs, Lateral pinch: Right: N/T lbs, Left: 9 lbs, and 3 point pinch: Right: N/T lbs, Left: 5 lbs Pt. Presents with flexor tightness in the right 5th digit, however is able to passively extend 5th digit within normal range. Pt. Also Presents with hyper extension in the 2nd and 3rd digit at PIP joints. Pt. Is able to achieve full Digit MP, PIP, and DIP PROM.  01/16/24: Grip strength: Left: 33 lbs; Lateral pinching: Left: 8 lbs; 3 point pinch: Left: 3 lbs  03/02/24: Grip strength: Left: 36 lbs; Lateral pinching: Left: 8 lbs; 3 point pinch: Left: 3 lbs  04/01/24: Grip strength: Left: (3 trials) 32 lbs, 26 lbs, 34 lbs; Lateral pinch: Pinch gauge unavailable (out for re-calibration)  COORDINATION: 9 Hole Peg test: Right: N/T sec; Left: 51 sec 01/16/24: Left: 1 min 1 sec 03/02/24:  Left: 1 min 1 sec 04/01/24: Left: 58 sec  SENSATION: Not tested  EDEMA:   MUSCLE TONE: Fluctuating flexor tone in R hand.  COGNITION: Overall cognitive status: Within functional limits for tasks assessed  VISION: Subjective report:  Baseline vision:  Visual history:   VISION ASSESSMENT:  PERCEPTION:   PRAXIS:   OBSERVATIONS: Pt. presents with fluctuating flexor tone in the  R hand, involving the 5th  digit at the PIP/DIP joint. Pt. presents with hyperextension at the 2nd and 3rd digits at the PIP joints.                                                                                                                    TREATMENT DATE: 04/27/24 Self Care: -Participation in handwriting tasks to sign/print name for receipts and other official documents using L hand, standard pen, and lined paper.  OT provided intermittent vc/tc for pen set up in L hand.   -Practiced writing phone number with paper/pen, requiring mod vc for strategies to increase legibility, including completing circular patterns within a number (ie 6, 8), lifting pen between numbers, slowing pace, increasing number height on paper, and increasing attention to each number without focus on the next number -Positioned pt without lateral support on mat table to challenge core stability and promote upright posture for handwriting tasks.  Feet supported on floor while pt used side table for handwriting.     Therapeutic Activity: -Facilitated core stability, LUE forward and lateral reaching patterns, and 3 point pinch strengthening for improving grasp of pen, working to move therapy resistant clothes pins on/off a vertical dowel.  Clips and dowel were positioned to challenge L lateral and forward weight shifts to promote core stability while returning to midline without UE support.   PATIENT EDUCATION: Education details: adult nurse Person educated: Patient Education method: Explanation and Verbal cues Education comprehension: verbal cues required  HOME EXERCISE PROGRAM: -Pink theraputty; visual handout issued -Self and caregiver assisted PROM to R hand and wrist -Monitor splint with skin checks  GOALS: Goals reviewed with patient? Yes  SHORT TERM GOALS: Target date: 04/13/2024   Pt. Will be independent with HEP for UE functioning. Baseline: Eval: No current HEP; 01/16/24: pt reports doing a little writing and squeezing a  ball, but denies using putty; spouse stretches RUE daily 02/20/24:  Pt. Continues to do a little writing and squeezing a ball, but denies using putty; spouse stretches RUE 03/02/24: Pt. Husband assists Pt. With ROM to the right hand. Pt. reports doing hand exercises; 03/30/24: Spouse indep with HEP (spouse assists daily) Goal status: achieved  LONG TERM GOALS: Target date: 05/25/2024  Pt. Will improve [facility] skills to be able to manipulate small objects at home Baseline: 9 hole peg test: L: 51 seconds; 01/16/24: L 1 min 1 sec, 03/02/24: Left: 1 min. & 1 sec. Pt. Continues to work towards using the left to manipulate small objects; 04/01/24: L 58 sec (multiple trials to improve time) Goal status: Ongoing  2.  Pt. Will increase BUE shoulder ROM by 10 degrees to be able to reach into cabinets and shelves.  Baseline: Shoulder flexion: R: 94(121), L: 102(125), Shoulder abduction: R: 75(106), L: 110(126); 01/16/24: Shoulder flexion: R: 100 with scaption (132), L: 134 with scaption (145), Shoulder abduction: R: 74 (98), L: 140 (170)02/20/24: Pt. Is improving with functional reaching through multiple planes with cues, and assist required. 03/02/24:  Shoulder flexion: R: 126(146), L: 142(148), Shoulder abduction: R: 93(120), L: WNL; 03/30/24: ROM to be updated next session; limited by time constraints with poc review, though pt reports she does not reach into cabinets; 04/01/24: Shoulder flexion: R: 100 (145), L: 140 (155); Shoulder abd: R: 50 (90), L: 165 (180); pt reports she does not reach into cabinets Goal status: Ongoing  3.  Pt. Will improve L grip strength by 5# to be able to securely grasp ADL items at home.  Baseline: Grip strength: R: NT, L: 43#; 01/16/24: L: 33#  03/02/24: 36#; 04/01/24: L: 34 lbs (best of 3 trials) Goal status: Ongoing  4.  Pt. Will increase L lateral key pinch strength by 3#  to open wide mouth jars.  Baseline: Lateral key pinch: R: NT, L: 9#; 01/16/24: L: 8# 03/02/24: Pinch meter is out  for calibration. Pt. Continues to have difficulty opening jars; 04/01/24: NT (pinch gauge not available); continued difficulty opening jars Goal status: Ongoing  5.  Pt. Will independently engage the right hand as a gross assist to the left hand 100% of the time during ADLs/IADLs. Baseline: Eval: Pt. With limited engagement of the right hand as a gross assist during tasks; 01/16/24: Observed <25% of the time during OT sessions 02/20/24: Pt. Continues to present with limited engagement of the RUE as a gross assist to the left hand approximately 25% of the time with cues. 03/02/24: Pt. Is starting to engage her right hand more as a gross assist to the left; 04/01/24: Pt engages the R hand as a fair assist to the L with intermittent vc from OT during tx sessions; pt admits she needs to use the R hand more at home. Goal status: Ongoing  6.  Pt. Will be able to manipulate zippers, and buttons efficiently with modified independence Baseline: Eval:Right: NT Left: 51 sec; 01/16/24: Left: 1 min and 1 sec 02/20/24: Pt. continues to have difficulty manipulating zippers and buttons.03/02/24: Pt. continues to have difficulty manipulating zippers, and buttons, and may benefit from reviewing adaptive devices; 04/01/24: Pt reports that she continues to avoid clothes with fasteners Goal status: Ongoing   7. Pt. Will improve handwriting legibility to 100% for one sentence in printed form for written correspondence.  Baseline: 50% legible in printed form for first name only; 01/16/24: 80-90% legible to print first name 1 of 4 attempts.  3 of 4 attempts 50% or less legible 02/20/24: first name only 100%, 25% for lists of words. 03/02/24:  first name only 100%, 25% for lists of words; 04/01/24: 100% legibility for a short 3 word sentence using L hand.  Goal status: achieved  8. Pt. Will independently, and efficiently transfer small 1/2 objects from the left hand to the right hand in midline in preparation from reaching up  to  place items at a target with 100% accuracy with the right hand. Baseline: 03/02/24: Pt. Has progressed to transferring 1.5 objects between her hand in midline, and reach up to place them at targets with 60% accuracy; 03/30/24: Pt can transfer jumbo pegs and clothespins (~1/5) from R to L hand with extra time, repeat trials, and occasional dropping. Pt continues to reach toward a target with ~60% accuracy.  Goal Status: ongoing  9. Pt. Will increased BUE strength by 2 mm grades to assist with ADLs, and IADLs.  Baseline: 03/02/24: UE strength: Shoulder flexion: R: 3+/5 L: 4/5, abduction: R: 3-/5, L 4/5:  elbow flexion: R:4+/5, L: 5/5, extension: R: 4+/5, L:5/5, wrist flexion: R:  4-/5  L: 4/5 wrist extension: R: 4-/5 L: 4/5; 04/01/24: see chart; no significant changes  Goal Status: New: 03/02/24  ASSESSMENT: CLINICAL IMPRESSION: Pt continues to demonstrate increased attention to therapeutic activities while positioned on mat table without UE support, also benefiting from this increased core challenge to improve posture during ADLs and functional mobility.  Pt was able to complete duration of OT session while seated edge of mat and feet supported on floor.  Pt required less assist today for pen set up in L hand.  Pt is demonstrating improved legibility with printing and signing first and last name, but legibility is inconsistent when writing numbers with circular patterns (ie 6, 8).  Pt tends to have an incomplete circle which then contributes to illegibility or a likeness to a letter rather than a number.  Attempted tracing numbers to increase legibility, but pt benefited more from repetition and visual cues for correct number patterns.  Pt will continue to benefit from skilled OT to address above noted goals, working towards increased indep with daily tasks and reduced burden of care on caregiver.   PERFORMANCE DEFICITS: in functional skills including ADLs, IADLs, coordination, dexterity, edema, tone,  ROM, strength, pain, Fine motor control, Gross motor control, endurance, and UE functional use, and psychosocial skills including coping strategies, environmental adaptation, habits, and routines and behaviors.   IMPAIRMENTS: are limiting patient from ADLs, IADLs, rest and sleep, leisure, and social participation.   CO-MORBIDITIES: may have co-morbidities  that affects occupational performance. Patient will benefit from skilled OT to address above impairments and improve overall function.  MODIFICATION OR ASSISTANCE TO COMPLETE EVALUATION: Min-Moderate modification of tasks or assist with assess necessary to complete an evaluation.  OT OCCUPATIONAL PROFILE AND HISTORY: Detailed assessment: Review of records and additional review of physical, cognitive, psychosocial history related to current functional performance.  CLINICAL DECISION MAKING: Moderate - several treatment options, min-mod task modification necessary  REHAB POTENTIAL: Good  EVALUATION COMPLEXITY: Moderate  PLAN:  OT FREQUENCY: 2x/week  OT DURATION: 12 weeks  PLANNED INTERVENTIONS: 97168 OT Re-evaluation, 97535 self care/ADL training, 02889 therapeutic exercise, 97530 therapeutic activity, 97112 neuromuscular re-education, 97140 manual therapy, 97018 paraffin, 02960 fluidotherapy, 97010 moist heat, 97010 cryotherapy, 97034 contrast bath, 97032 electrical stimulation (manual), 97760 Orthotic Initial, passive range of motion, energy conservation, coping strategies training, patient/family education, and DME and/or AE instructions  RECOMMENDED OTHER SERVICES: PT   CONSULTED AND AGREED WITH PLAN OF CARE: Patient  PLAN FOR NEXT SESSION: see above  Inocente Blazing, MS, OTR/L

## 2024-04-29 ENCOUNTER — Encounter: Admitting: Speech Pathology

## 2024-04-29 ENCOUNTER — Encounter: Payer: Self-pay | Admitting: Physical Therapy

## 2024-04-29 ENCOUNTER — Ambulatory Visit: Admitting: Physical Therapy

## 2024-04-29 ENCOUNTER — Ambulatory Visit

## 2024-04-29 DIAGNOSIS — M6281 Muscle weakness (generalized): Secondary | ICD-10-CM

## 2024-04-29 DIAGNOSIS — R296 Repeated falls: Secondary | ICD-10-CM

## 2024-04-29 DIAGNOSIS — R41841 Cognitive communication deficit: Secondary | ICD-10-CM

## 2024-04-29 DIAGNOSIS — R2689 Other abnormalities of gait and mobility: Secondary | ICD-10-CM

## 2024-04-29 DIAGNOSIS — R269 Unspecified abnormalities of gait and mobility: Secondary | ICD-10-CM

## 2024-04-29 DIAGNOSIS — R262 Difficulty in walking, not elsewhere classified: Secondary | ICD-10-CM

## 2024-04-29 DIAGNOSIS — R278 Other lack of coordination: Secondary | ICD-10-CM

## 2024-04-29 NOTE — Therapy (Unsigned)
 OUTPATIENT PHYSICAL THERAPY TREATMENT   Patient Name: Erika Stanton MRN: [id number] DOB:1956/03/29, 68 y.o., female Today's Date: 04/29/2024  PCP: Dr. Oneil Galloway  REFERRING PROVIDER: Dr. Oneil Galloway  END OF SESSION:   PT End of Session - 04/29/24 1450     Visit Number 36    Number of Visits 45    Date for Recertification  05/27/24    Authorization Type [employer]    Progress Note Due on Visit 40    PT Start Time 1446    PT Stop Time 1530    PT Time Calculation (min) 44 min    Equipment Utilized During Treatment Gait belt    Activity Tolerance Patient tolerated treatment well    Behavior During Therapy WFL for tasks assessed/performed               Past Medical History:  Diagnosis Date   Anatomical narrow angle, bilateral    Aneurysm    Asthma    Cerebral aneurysm    Cerebral vasospasm    Cognitive change    Dysphagia, post-stroke    GERD (gastroesophageal reflux disease)    Hemorrhoids    History of cervical dysplasia    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 no recurrent polyps.  suggest repeat surveillance study 11/2022.     COLONOSCOPY W/ POLYPECTOMY  06/2014   Dr Elder at [facility].  3 adenomatoous polyps, anal fissure.     ESOPHAGOGASTRODUODENOSCOPY (EGD) WITH PROPOFOL  N/A 01/27/2020   Procedure: ESOPHAGOGASTRODUODENOSCOPY (EGD) WITH PROPOFOL ;  Surgeon: Shila Gustav GAILS, MD;  Location: [facility] ENDOSCOPY;  Service: Endoscopy;  Laterality: N/A;   INSERTION OF PERMANENT INTRAPERITONEAL CANNULA/CATH N/A    LAPAROSCOPIC   IR  3D INDEPENDENT WKST  01/06/2020   IR ANGIO INTRA EXTRACRAN SEL INTERNAL CAROTID BILAT MOD SED  01/06/2020   IR ANGIO VERTEBRAL SEL VERTEBRAL UNI L MOD SED  01/06/2020   IR ANGIOGRAM FOLLOW UP STUDY  01/06/2020   IR ANGIOGRAM FOLLOW UP STUDY  01/06/2020   IR ANGIOGRAM FOLLOW UP STUDY  01/06/2020   IR ANGIOGRAM FOLLOW UP STUDY  01/06/2020   IR ANGIOGRAM FOLLOW UP STUDY  01/06/2020   IR ANGIOGRAM FOLLOW UP STUDY  01/06/2020   IR ANGIOGRAM FOLLOW UP STUDY  01/06/2020   IR ANGIOGRAM FOLLOW UP STUDY  01/06/2020   IR ANGIOGRAM FOLLOW UP STUDY  01/06/2020   IR ANGIOGRAM FOLLOW UP STUDY  01/06/2020   IR NEURO EACH ADD'L AFTER BASIC UNI RIGHT (MS)  01/06/2020   IR TRANSCATH/EMBOLIZ  01/06/2020   MASTECTOMY     MOHS SURGERY N/A    RADIOLOGY WITH ANESTHESIA N/A 01/06/2020   Procedure: IR WITH ANESTHESIA FOR ANEURYSM;  Surgeon: Lanis Pupa, MD;  Location: [facility] OR;  Service: Radiology;  Laterality: N/A;   STENT SUPPORTED EMBOLIZATION OF RIGHT ICA ANEURYSM Right    TUBAL LIGATION     VENTRICULO-PERITONEAL SHUNT PLACEMENT / LAPAROSCOPIC INSERTION PERITONEAL CATHETER N/A    Patient Active Problem  List   Diagnosis Date Noted   Dyspareunia in female 04/05/2020   History of abnormal cervical Pap smear 03/10/2020   GIB (gastrointestinal bleeding) 02/28/2020   Elevated BUN    Prediabetes    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   Cerebral vasospasm    Sinus tachycardia    Dysphagia, post-stroke    Thrombocytopenia    Acute blood loss anemia    Brain aneurysm    ICH (intracerebral hemorrhage) (HCC) 01/05/2020   History of adenomatous polyp of colon 02/07/2016   Anatomical narrow angle, bilateral 12/14/2014   Fissure in ano 11/11/2014   Hemorrhoids 11/11/2014   Constipation 11/19/2013   GERD (gastroesophageal reflux disease) 03/24/2013   Irritable bowel syndrome with diarrhea 03/24/2013   Herpes zoster 05/14/2005   Abnormal Pap smear of cervix 05/15/1983   History of cervical  dysplasia 05/15/1983    ONSET DATE: 12/02/2023  REFERRING DIAG: I63.9 (ICD-10-CM) - CVA (cerebral vascular accident) (HCC)   THERAPY DIAG:  Muscle weakness (generalized)  Other lack of coordination  Cognitive communication deficit  Difficulty in walking, not elsewhere classified  Abnormality of gait and mobility  Repeated falls  Other abnormalities of gait and mobility  Rationale for Evaluation and Treatment: Rehabilitation  SUBJECTIVE:                                                                                                                                                                                             SUBJECTIVE STATEMENT: Pt states she feels okay today, no new updates   PERTINENT HISTORY:   Patient reports she was in hospital from [facility]- Sat (July 21st- 25th) with CVA affecting left side. I was supposed to have a knee replacement surgery in September but that is on hold for now. I was doing my prep for my colonoscopy and fell in the bathroom.  Patient reports having mild left sided weakness compared to her CVA in 2021 with heavy Right sided UE weakness. She reports bad OA affecting both knees as well. Previous CVA in 2021 affecting Right side. Reports going to ED  last Monday due to fall/CVA affecting Left side. Diagnosed with R CVA with Left Sided weakness.    PAIN:  Are you having pain? B pain knees, 3/10  PRECAUTIONS: Fall  WEIGHT BEARING RESTRICTIONS: No  FALLS: Has patient fallen in last 6 months? Yes. Number of falls fell with the stroke  PATIENT GOALS: Walk without any assistive device.  OBJECTIVE:  Note: Objective measures were completed at evaluation unless otherwise noted.  TREATMENT DATE : 04/29/2024  Nustep, rolling hill program, 8 min, levels 2-5, seat 10, arms 8 seated LAQ, 2# AW on B LE, 2x20 standing marches  at raised mat, 2x20, U UE support, CGA from SPT, VC for increased height of knee 136ft ambulation using SPC, supervision/CGA from SPT 10x sit<>stand 130ft ambulation with 2# AW on B LE, supervision/CGA from SPT 10x sit<>stand 144ft ambulation with 2# AW on B LE, supervision/CGA from SPT 10x sit<>stand  PATIENT EDUCATION: Education details: PT plan of care; Discussion of symptoms of CVA and to go to ED as soon as possible. Purpose of PT for balance, strength, coordination and endurance.  Person educated: Patient Education method: Explanation Education comprehension: verbalized understanding  HOME EXERCISE PROGRAM: Access Code: [email or web address] URL: [email or web address] Date: 12/17/2023 Prepared by: Sidra Simpers  Program Notes **Be sure to perform all exercises next to a countertop or sturdy piece of furniture in case you become unsteady.  Exercises - Standing Heel Raise with Support  - 1 x daily - 7 x weekly - 2 sets - 15 reps - Standing Toe Raises at Chair  - 1 x daily - 7 x weekly - 2 sets - 15 reps - Mini Squat with Counter Support  - 1 x daily - 7 x weekly - 2 sets - 15 reps - Lunge with Counter Support  - 1 x daily - 7 x weekly - 2 sets - 15 reps - Standing Tandem Balance with Counter Support  - 1 x daily - 7 x weekly - 2 sets - 2 reps - 30 hold - Standing Single Leg Stance with Counter Support  - 1 x daily - 7 x weekly - 2 sets - 2 reps - 30 hold  GOALS: Goals reviewed with patient? Yes  SHORT TERM GOALS: Target date: 01/21/2024  Pt will be independent with HEP in order to improve strength and balance in order to decrease fall risk and improve function at home.  Baseline: EVAL - No current formal HEP in place 03/04/2024- Patient reports independent with all current HEP and riding a stationary bike.  Goal status: MET   LONG TERM GOALS: Target date: 05/27/2024  1.  Patient will complete five times sit to stand test in < 15 seconds indicating an increased LE strength  and improved balance. Baseline: EVAL= 25.20 sec with LUE Support 9/2: 23.36 without UE support and CGA-min assist. 20.10 sec with LUE support 03/04/2024- more difficulty raising without UE but performed 18.75 sec with LUE Support 04/06/24: 21.99s with L UE support on chair rail 12/4:16.59 sec with L UE assist   Goal status: IN PROGRESS   2.  Patient will increase Berg Balance score by > 6 points to demonstrate decreased fall risk during functional activities. Baseline: EVAL: To be assessed next visit; 12/12/2023= 42/56 9/2: 44/56  10/16:45/56 12/4: 47 Goal status: PROGRESSING   3.  Patient will reduce timed up and go to <11 seconds to reduce fall risk and demonstrate improved transfer/gait ability. Baseline: EVAL = 40 sec with RW 9/2: 18.33sec with SPC 10/16:17.47 sec with SPC 04/06/24: 20.17s w/ SPC 12/4:20.86 sec with SPC 16.58 sec with no AD  Goal status: PROGRESSING  4.  Patient will increase 10 meter walk test to >1.24m/s as to improve gait speed for better community ambulation and to reduce fall risk. Baseline: EVAL: 0.65 m/s 9/2: 0.67 m/s with [facility] 9/9: 0.71 m/s without an AD and wearing 2.5 # AW 10/16: .75 m/s with [facility] 04/06/24: 0.51 m/s 12/4: .675  m/s with SPC, .70 m/s no AD  Goal status: PROGRESSING  5. Patient will increase six minute walk test distance by 200 feet for progression to community ambulator and improve gait ability Baseline: EVAL= 482' 9/2: 465 feet using a SPC with CGA required seated rest break at 4:7min   10/16: 542 ft and full 6 minutes  04/06/24: 525 ft using SPC with CGA, 3 rest breaks taken throughout, @ 2:31 min, 3:43min, and 5:69min 12/4:625 ft total mixed SPC and no SPC initial 300 ft no AD only one standing rest break allowed around 400 ft, encouraged pt to not rest during test.  Goal status: PROGRESS    ASSESSMENT:  CLINICAL IMPRESSION:  Pt continues to show self-limiting behavior, decreased confidence, and B knee pain limiting  activity. Pt continues to need significant encouragement to complete activities she has previously performed, making progression difficult. Continued complaint of B knee pain persists, making standing and ambulation activities difficult as well. Initially began first bout of ambulation with 3# AW, but the weights were removed following 71ft, due to pt reported difficulty and knee pain. Pt needed and requested consistent rest breaks between all bouts of activity again in today's session. Pt would benefit from discussion regarding D/C from therapy due to consistent B knee pain and limitations to activity. Pt will continue to benefit from skilled therapy to address remaining deficits in order to improve overall QoL and return to PLOF.   OBJECTIVE IMPAIRMENTS: Abnormal gait, decreased activity tolerance, decreased balance, decreased coordination, decreased endurance, decreased mobility, difficulty walking, decreased ROM, decreased strength, impaired UE functional use, and pain.   ACTIVITY LIMITATIONS: carrying, lifting, bending, sitting, standing, squatting, sleeping, stairs, and transfers  PARTICIPATION LIMITATIONS: meal prep, cleaning, laundry, driving, shopping, community activity, and yard work  PERSONAL FACTORS: 1-2 comorbidities: OA, previous CVA with significant R sided weakness are also affecting patient's functional outcome.   REHAB POTENTIAL: Good  CLINICAL DECISION MAKING: Stable/uncomplicated  EVALUATION COMPLEXITY: Moderate  PLAN:  PT FREQUENCY: 1-2x/week  PT DURATION: 12 weeks  PLANNED INTERVENTIONS: 97164- PT Re-evaluation, 97750- Physical Performance Testing, 97110-Therapeutic exercises, 97530- Therapeutic activity, [id number]- Neuromuscular re-education, 97535- Self Care, [id number]- Manual therapy, [id number]- Gait training, [phone]- Orthotic Initial, [phone]- Orthotic/Prosthetic subsequent, [phone]- Canalith repositioning, [phone]- Electrical stimulation (manual), [phone] (1-2 muscles), [phone] (3+  muscles)- Dry Needling, Patient/Family education, Balance training, Stair training, Taping, Joint mobilization, Joint manipulation, Spinal manipulation, Spinal mobilization, Compression bandaging, Vestibular training, DME instructions, Cryotherapy, and Moist heat  PLAN FOR NEXT SESSION:  -continue ambulation distance and activity tolerance -general LE strengthening -introduce D/C conversation  Renna Helling, SPT 2:52 PM, 04/29/2024   I have read and reviewed the attached note and am in agreement with the documentation provided.     This licensed clinician was present and actively directing care throughout the session at all times.   Massie Dollar PT, DPT  Physical Therapist - [place]  [facility]  7:52 AM 04/30/2024

## 2024-04-29 NOTE — Therapy (Signed)
 Outpatient Occupational Therapy Neuro Treatment Note  Patient Name: Erika Stanton MRN: [id number] DOB:[birth year], 68 y.o., female Today's Date: 04/29/2024  PCP: Laurice Anes, MD REFERRING PROVIDER: Laurice Anes, MD   OT End of Session - 04/29/24 1446     Visit Number 38    Number of Visits 48    Date for Recertification  05/25/24    Authorization Time Period Reporting period beginning 03/30/24    OT Start Time 1400    OT Stop Time 1445    OT Time Calculation (min) 45 min    Equipment Utilized During Treatment transport chair, SPC    Activity Tolerance Patient tolerated treatment well    Behavior During Therapy WFL for tasks assessed/performed         Past Medical History:  Diagnosis Date   Anatomical narrow angle, bilateral    Aneurysm    Asthma    Cerebral aneurysm    Cerebral vasospasm    Cognitive change    Dysphagia, post-stroke    GERD (gastroesophageal reflux disease)    Hemorrhoids    History of cervical dysplasia    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 no recurrent polyps.  suggest repeat surveillance study 11/2022.     COLONOSCOPY W/ POLYPECTOMY  06/2014   Dr Elder at [facility].  3 adenomatoous polyps, anal fissure.     ESOPHAGOGASTRODUODENOSCOPY (EGD) WITH PROPOFOL  N/A 01/27/2020   Procedure: ESOPHAGOGASTRODUODENOSCOPY (EGD) WITH PROPOFOL ;  Surgeon: Shila Gustav GAILS, MD;  Location: [facility] ENDOSCOPY;  Service: Endoscopy;  Laterality: N/A;   INSERTION OF PERMANENT INTRAPERITONEAL CANNULA/CATH N/A    LAPAROSCOPIC   IR 3D  INDEPENDENT WKST  01/06/2020   IR ANGIO INTRA EXTRACRAN SEL INTERNAL CAROTID BILAT MOD SED  01/06/2020   IR ANGIO VERTEBRAL SEL VERTEBRAL UNI L MOD SED  01/06/2020   IR ANGIOGRAM FOLLOW UP STUDY  01/06/2020   IR ANGIOGRAM FOLLOW UP STUDY  01/06/2020   IR ANGIOGRAM FOLLOW UP STUDY  01/06/2020   IR ANGIOGRAM FOLLOW UP STUDY  01/06/2020   IR ANGIOGRAM FOLLOW UP STUDY  01/06/2020   IR ANGIOGRAM FOLLOW UP STUDY  01/06/2020   IR ANGIOGRAM FOLLOW UP STUDY  01/06/2020   IR ANGIOGRAM FOLLOW UP STUDY  01/06/2020   IR ANGIOGRAM FOLLOW UP STUDY  01/06/2020   IR ANGIOGRAM FOLLOW UP STUDY  01/06/2020   IR NEURO EACH ADD'L AFTER BASIC UNI RIGHT (MS)  01/06/2020   IR TRANSCATH/EMBOLIZ  01/06/2020   MASTECTOMY     MOHS SURGERY N/A    RADIOLOGY WITH ANESTHESIA N/A 01/06/2020   Procedure: IR WITH ANESTHESIA FOR ANEURYSM;  Surgeon: Lanis Pupa, MD;  Location: [facility] OR;  Service: Radiology;  Laterality: N/A;   STENT SUPPORTED EMBOLIZATION OF RIGHT ICA ANEURYSM Right    TUBAL LIGATION     VENTRICULO-PERITONEAL SHUNT PLACEMENT / LAPAROSCOPIC INSERTION PERITONEAL CATHETER N/A    Patient Active Problem List   Diagnosis Date Noted   Dyspareunia in female 04/05/2020  History of abnormal cervical Pap smear 03/10/2020   GIB (gastrointestinal bleeding) 02/28/2020   Elevated BUN    Prediabetes    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   Cerebral vasospasm    Sinus tachycardia    Dysphagia, post-stroke    Thrombocytopenia    Acute blood loss anemia    Brain aneurysm    ICH (intracerebral hemorrhage) (HCC) 01/05/2020   History of adenomatous polyp of colon 02/07/2016   Anatomical narrow angle, bilateral 12/14/2014   Fissure in ano 11/11/2014   Hemorrhoids 11/11/2014   Constipation 11/19/2013   GERD (gastroesophageal reflux disease) 03/24/2013   Irritable bowel syndrome with diarrhea 03/24/2013   Herpes zoster 05/14/2005   Abnormal Pap smear of cervix 05/15/1983   History of cervical dysplasia  05/15/1983   ONSET DATE: 12/02/2023  REFERRING DIAG: L ischemic CVA  THERAPY DIAG:  Muscle weakness (generalized)  Other lack of coordination  Rationale for Evaluation and Treatment: Rehabilitation  SUBJECTIVE:  SUBJECTIVE STATEMENT: Pt reports [place]   PERTINENT HISTORY:  Pt. was admitted to [facility] from 12/02/23-12/06/23 with an Acute Right MCA CVA, Hydrocephalus with shunt  placement. History of Subarachnoid Hemorrhage with bilateral ICA coiled aneurysms. 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  PRECAUTIONS: None  WEIGHT BEARING RESTRICTIONS: No  PAIN: 04/29/24: No UE pain, 3-4/10 in knees Are you having pain? No  FALLS: Has patient fallen in last 6 months? Yes. Number of falls 1  LIVING ENVIRONMENT: Lives with: lives with their family and lives with their spouse Lives in: House/apartment Stairs: hand rails, 2 steps   Has following equipment at home: [name], cane  PLOF: Independent with basic ADLs, Spouse help as needed  PATIENT GOALS: Strengthening  OBJECTIVE:  Note: Objective measures were completed at Evaluation unless otherwise noted.  HAND DOMINANCE: Left  ADLs: Overall ADLs: spouse assists when needed, Pt. reports that she can do most tasks herself. Uses her left hand to engage in self-care tasks. Eating: Able to cut foods with a fork, husband assists as needed.  Grooming: husband assists with applying toothpaste, able to brush teeth and hair UB Dressing: Spouse assists with applying/fastening bra, Pt. Requires assistance with zippers, and buttons. LB Dressing: Spouse assists with tying shoes when needed, uses slip on  shoes. Pt. Does not zip pants. Toileting: Able to complete independently Bathing: Pt. Requires intermittent assistance with bathing tasks. Tub Shower transfers: Spouse assists with transfers when/if needed Equipment: Shower seat with back  IADLs: Shopping: Spouse typically does the shopping, isnt able to carry heavy objects.  Light housekeeping: Spouse typically does most of the cleaning around the home, Pt. Cleans bathroom, and laundry Pt. Is unable to carry laundry due to exacerbation of weakness.  Meal Prep: Spouse typically cooks and prepares meals.  Community mobility: Pt. Requires assistance using walker. Pt. reports that she is unable to carry heavy items, and typically does not do the shopping.  Medication management: Pt. is able to take care of meds with pillbox Financial management:  Spouse takes care of it, and makes online payments.  Handwriting: 50% legible  MOBILITY STATUS: Needs Assist: Pt. Requires use of a walker   POSTURE COMMENTS:  No Significant postural limitations Sitting balance: Good  ACTIVITY TOLERANCE: Activity tolerance:   FUNCTIONAL OUTCOME MEASURES: TBD  UPPER EXTREMITY ROM:    Active ROM Right eval Right 01/16/24 Right 03/02/24 Right 04/01/24  Left eval Left 01/16/24 Left 03/02/24 Left 04/01/24  Shoulder flexion 94(121) 100 with scaption (132) 126(146) 100 some scaption (145) 102(125) 134 with scaption (145) 142(148) 140 (155)  Shoulder abduction 75(106) 74 (98) 93(120) 58(90) 110(126) 140 (170) WNL 165 (180)  Shoulder adduction          Shoulder extension          Shoulder internal rotation          Shoulder external rotation          Elbow flexion 135(155)  150(155)  140(150)  150   Elbow extension 0  0  -10(-9)  -4(0)   Wrist flexion 40(44)  48(60)  42(58)  52(60)   Wrist extension 30(34)  48(60)  40(50)  52(56)   Wrist ulnar deviation          Wrist radial deviation          Wrist pronation          Wrist supination          (Blank  rows = not tested)  UPPER EXTREMITY MMT:     MMT Right eval Right 03/02/24 Right 04/01/24 Left eval Left 03/02/24 Left 04/01/24  Shoulder flexion 3-/5 3+/5 3- 3-/5 4/5 4/5  Shoulder abduction 3-/5 3-/5 3- 3-/5 4/5 4/5  Shoulder adduction        Shoulder extension        Shoulder internal rotation        Shoulder external rotation        Middle trapezius        Lower trapezius        Elbow flexion 4-/5 4+/5 4+ 4+/5 5/5 5/5  Elbow extension 4-/5 4+/5 4/5 4+/5 5/5 5/5  Wrist flexion 4-/5 4-/5 4/5 3+/5 4/5 4+/5  Wrist extension 4-/5 4-/5 4- 3+/5 4/5 4+/5  Wrist ulnar deviation        Wrist radial deviation        Wrist pronation        Wrist supination        (Blank rows = not tested)  HAND FUNCTION Eval:  Grip strength: Right: N/T lbs; Left: 43 lbs, Lateral pinch: Right: N/T lbs, Left: 9 lbs, and 3 point pinch: Right: N/T lbs, Left: 5 lbs Pt. Presents with flexor tightness in the right 5th digit, however is able to passively extend 5th digit within normal range. Pt. Also Presents with hyper extension in the 2nd and 3rd digit at PIP joints. Pt. Is able to achieve full Digit MP, PIP, and DIP PROM.  01/16/24: Grip strength: Left: 33 lbs; Lateral pinching: Left: 8 lbs; 3 point pinch: Left: 3 lbs  03/02/24: Grip strength: Left: 36 lbs; Lateral pinching: Left: 8 lbs; 3 point pinch: Left: 3 lbs  04/01/24: Grip strength: Left: (3 trials) 32 lbs, 26 lbs, 34 lbs; Lateral pinch: Pinch gauge unavailable (out for re-calibration)  COORDINATION: 9 Hole Peg test: Right: N/T sec; Left: 51 sec 01/16/24: Left: 1 min 1 sec 03/02/24:  Left: 1 min 1 sec 04/01/24: Left: 58 sec  SENSATION: Not tested  EDEMA:   MUSCLE TONE: Fluctuating flexor tone in R hand.  COGNITION: Overall cognitive status: Within functional limits for tasks assessed  VISION: Subjective report:  Baseline vision:  Visual history:   VISION ASSESSMENT:  PERCEPTION:   PRAXIS:  OBSERVATIONS: Pt. presents with  fluctuating flexor tone in the R hand, involving the 5th digit at the PIP/DIP joint. Pt. presents with hyperextension at the 2nd and 3rd digits at the PIP joints.                                                                                                                    TREATMENT DATE: 04/29/24 Therapeutic Activity: -Promoted dynamic standing/standing tolerance, handwriting skills, attention to task, and visual spatial skills: pt stood to complete word game (Hang Man) on white board placed flat on elevated table top using large marker for writing.  Pt played 3 games, completing drawings for game set up and filling in letters while formulating sequence of stick figures.  Pt required encouragement for increasing standing tolerance on 2 trials, limited to 3 min for 1st trial and <2 min on 2nd trial before requiring seated rest break.  Pt sat for remaining time of each game played.  Pt required intermittent assist for set up of tripod grasp in L hand, min vc for making corrections to letters to increase legibility, and mod vc for game set up as pt was given a number of spaces to draw for an unknown word, and repeatedly drew 2-3 extra lines per word, requesting validation for correct count each time.    Therapeutic Exercise: -L grip strengthening: hand gripper set at 17.9# for 1st trial, reducing to 11.2# after several dropped pegs, and completed an additional trial on 11.2# to remove jumbo pegs from pegboard.  Constant supv needed for attention to task to reduce dropped pegs, as well as encouragement for completing 2nd trial at above noted resistance level.   PATIENT EDUCATION: Education details: adult nurse Person educated: Patient Education method: Explanation and Verbal cues Education comprehension: verbal cues required  HOME EXERCISE PROGRAM: -Pink theraputty; visual handout issued -Self and caregiver assisted PROM to R hand and wrist -Monitor splint with skin  checks  GOALS: Goals reviewed with patient? Yes  SHORT TERM GOALS: Target date: 04/13/2024   Pt. Will be independent with HEP for UE functioning. Baseline: Eval: No current HEP; 01/16/24: pt reports doing a little writing and squeezing a ball, but denies using putty; spouse stretches RUE daily 02/20/24:  Pt. Continues to do a little writing and squeezing a ball, but denies using putty; spouse stretches RUE 03/02/24: Pt. Husband assists Pt. With ROM to the right hand. Pt. reports doing hand exercises; 03/30/24: Spouse indep with HEP (spouse assists daily) Goal status: achieved  LONG TERM GOALS: Target date: 05/25/2024  Pt. Will improve [facility] skills to be able to manipulate small objects at home Baseline: 9 hole peg test: L: 51 seconds; 01/16/24: L 1 min 1 sec, 03/02/24: Left: 1 min. & 1 sec. Pt. Continues to work towards using the left to manipulate small objects; 04/01/24: L 58 sec (multiple trials to improve time) Goal status: Ongoing  2.  Pt. Will increase BUE shoulder ROM by 10 degrees to be able to reach into cabinets and  shelves.  Baseline: Shoulder flexion: R: 94(121), L: 102(125), Shoulder abduction: R: 75(106), L: 110(126); 01/16/24: Shoulder flexion: R: 100 with scaption (132), L: 134 with scaption (145), Shoulder abduction: R: 74 (98), L: 140 (170)02/20/24: Pt. Is improving with functional reaching through multiple planes with cues, and assist required. 03/02/24: Shoulder flexion: R: 126(146), L: 142(148), Shoulder abduction: R: 93(120), L: WNL; 03/30/24: ROM to be updated next session; limited by time constraints with poc review, though pt reports she does not reach into cabinets; 04/01/24: Shoulder flexion: R: 100 (145), L: 140 (155); Shoulder abd: R: 50 (90), L: 165 (180); pt reports she does not reach into cabinets Goal status: Ongoing  3.  Pt. Will improve L grip strength by 5# to be able to securely grasp ADL items at home.  Baseline: Grip strength: R: NT, L: 43#; 01/16/24: L: 33#   03/02/24: 36#; 04/01/24: L: 34 lbs (best of 3 trials) Goal status: Ongoing  4.  Pt. Will increase L lateral key pinch strength by 3#  to open wide mouth jars.  Baseline: Lateral key pinch: R: NT, L: 9#; 01/16/24: L: 8# 03/02/24: Pinch meter is out for calibration. Pt. Continues to have difficulty opening jars; 04/01/24: NT (pinch gauge not available); continued difficulty opening jars Goal status: Ongoing  5.  Pt. Will independently engage the right hand as a gross assist to the left hand 100% of the time during ADLs/IADLs. Baseline: Eval: Pt. With limited engagement of the right hand as a gross assist during tasks; 01/16/24: Observed <25% of the time during OT sessions 02/20/24: Pt. Continues to present with limited engagement of the RUE as a gross assist to the left hand approximately 25% of the time with cues. 03/02/24: Pt. Is starting to engage her right hand more as a gross assist to the left; 04/01/24: Pt engages the R hand as a fair assist to the L with intermittent vc from OT during tx sessions; pt admits she needs to use the R hand more at home. Goal status: Ongoing  6.  Pt. Will be able to manipulate zippers, and buttons efficiently with modified independence Baseline: Eval:Right: NT Left: 51 sec; 01/16/24: Left: 1 min and 1 sec 02/20/24: Pt. continues to have difficulty manipulating zippers and buttons.03/02/24: Pt. continues to have difficulty manipulating zippers, and buttons, and may benefit from reviewing adaptive devices; 04/01/24: Pt reports that she continues to avoid clothes with fasteners Goal status: Ongoing   7. Pt. Will improve handwriting legibility to 100% for one sentence in printed form for written correspondence.  Baseline: 50% legible in printed form for first name only; 01/16/24: 80-90% legible to print first name 1 of 4 attempts.  3 of 4 attempts 50% or less legible 02/20/24: first name only 100%, 25% for lists of words. 03/02/24:  first name only 100%, 25% for lists of words;  04/01/24: 100% legibility for a short 3 word sentence using L hand.  Goal status: achieved  8. Pt. Will independently, and efficiently transfer small 1/2 objects from the left hand to the right hand in midline in preparation from reaching up  to place items at a target with 100% accuracy with the right hand. Baseline: 03/02/24: Pt. Has progressed to transferring 1.5 objects between her hand in midline, and reach up to place them at targets with 60% accuracy; 03/30/24: Pt can transfer jumbo pegs and clothespins (~1/5) from R to L hand with extra time, repeat trials, and occasional dropping. Pt continues to reach toward a target with ~60%  accuracy.  Goal Status: ongoing  9. Pt. Will increased BUE strength by 2 mm grades to assist with ADLs, and IADLs.  Baseline: 03/02/24: UE strength: Shoulder flexion: R: 3+/5 L: 4/5, abduction: R: 3-/5, L 4/5:  elbow flexion: R:4+/5, L: 5/5, extension: R: 4+/5, L:5/5, wrist flexion: R: 4-/5  L: 4/5 wrist extension: R: 4-/5 L: 4/5; 04/01/24: see chart; no significant changes  Goal Status: New: 03/02/24  ASSESSMENT: CLINICAL IMPRESSION: Standing tolerance limited to ~3 min on 1 trial and <2 min on a 2nd trial, though pt verbalized increased difficulty with standing while dual tasking, specifically with a cognitive task as pt verbalized being flustered and needing to sit down.  Repeat assist to set up L hand with tripod grasp for each game.  Pt demonstrated improved legibility with uppercase letters, but did require vc for spacing and increasing letter height to improve legibility.  Pt continues to be challenged with visual spatial skills, requiring mod vc for game set up as pt was given a number of spaces to draw for an unknown word, and repeatedly drew 2-3 extra lines per word, requesting validation for correct count each time, as well as vc for ensuring spaces between lines to distinguish between each letter.  L grip strength remains weak; pt had many dropped pegs on  2nd trial on 11.2# of resistance with hand gripper.  Pt was able to complete 2 trials with encouragement and positive reinforcement from OT, as well as vc for increasing attention to timing when releasing hand gripper to prevent dropping pegs before reaching container.  Pt will continue to benefit from skilled OT to address above noted goals, working towards increased indep with daily tasks and reduced burden of care on caregiver.   PERFORMANCE DEFICITS: in functional skills including ADLs, IADLs, coordination, dexterity, edema, tone, ROM, strength, pain, Fine motor control, Gross motor control, endurance, and UE functional use, and psychosocial skills including coping strategies, environmental adaptation, habits, and routines and behaviors.   IMPAIRMENTS: are limiting patient from ADLs, IADLs, rest and sleep, leisure, and social participation.   CO-MORBIDITIES: may have co-morbidities  that affects occupational performance. Patient will benefit from skilled OT to address above impairments and improve overall function.  MODIFICATION OR ASSISTANCE TO COMPLETE EVALUATION: Min-Moderate modification of tasks or assist with assess necessary to complete an evaluation.  OT OCCUPATIONAL PROFILE AND HISTORY: Detailed assessment: Review of records and additional review of physical, cognitive, psychosocial history related to current functional performance.  CLINICAL DECISION MAKING: Moderate - several treatment options, min-mod task modification necessary  REHAB POTENTIAL: Good  EVALUATION COMPLEXITY: Moderate  PLAN:  OT FREQUENCY: 2x/week  OT DURATION: 12 weeks  PLANNED INTERVENTIONS: 97168 OT Re-evaluation, 97535 self care/ADL training, 02889 therapeutic exercise, 97530 therapeutic activity, 97112 neuromuscular re-education, 97140 manual therapy, 97018 paraffin, 02960 fluidotherapy, 97010 moist heat, 97010 cryotherapy, 97034 contrast bath, 97032 electrical stimulation (manual), 97760 Orthotic Initial,  passive range of motion, energy conservation, coping strategies training, patient/family education, and DME and/or AE instructions  RECOMMENDED OTHER SERVICES: PT   CONSULTED AND AGREED WITH PLAN OF CARE: Patient  PLAN FOR NEXT SESSION: see above  Inocente Blazing, MS, OTR/L

## 2024-05-04 ENCOUNTER — Ambulatory Visit

## 2024-05-04 ENCOUNTER — Ambulatory Visit: Admitting: Physical Therapy

## 2024-05-04 ENCOUNTER — Encounter: Admitting: Speech Pathology

## 2024-05-04 DIAGNOSIS — R296 Repeated falls: Secondary | ICD-10-CM

## 2024-05-04 DIAGNOSIS — R278 Other lack of coordination: Secondary | ICD-10-CM

## 2024-05-04 DIAGNOSIS — R2689 Other abnormalities of gait and mobility: Secondary | ICD-10-CM

## 2024-05-04 DIAGNOSIS — M6281 Muscle weakness (generalized): Secondary | ICD-10-CM | POA: Diagnosis not present

## 2024-05-04 DIAGNOSIS — R269 Unspecified abnormalities of gait and mobility: Secondary | ICD-10-CM

## 2024-05-04 DIAGNOSIS — R262 Difficulty in walking, not elsewhere classified: Secondary | ICD-10-CM

## 2024-05-04 NOTE — Therapy (Signed)
 " OUTPATIENT PHYSICAL THERAPY TREATMENT   Patient Name: Erika Stanton MRN: [id number] DOB:06-Feb-1956, 68 y.o., female Today's Date: 05/04/2024  PCP: Dr. Oneil Galloway  REFERRING PROVIDER: Dr. Oneil Galloway  END OF SESSION:   PT End of Session - 05/04/24 1527     Visit Number 37    Number of Visits 45    Date for Recertification  05/27/24    Authorization Type [employer]    Progress Note Due on Visit 40    PT Start Time 1532    PT Stop Time 1615    PT Time Calculation (min) 43 min    Equipment Utilized During Treatment Gait belt    Activity Tolerance Patient tolerated treatment well    Behavior During Therapy WFL for tasks assessed/performed               Past Medical History:  Diagnosis Date   Anatomical narrow angle, bilateral    Aneurysm    Asthma    Cerebral aneurysm    Cerebral vasospasm    Cognitive change    Dysphagia, post-stroke    GERD (gastroesophageal reflux disease)    Hemorrhoids    History of cervical dysplasia    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 no recurrent polyps.  suggest repeat surveillance study 11/2022.     COLONOSCOPY W/ POLYPECTOMY  06/2014   Dr Elder at [facility].  3 adenomatoous polyps, anal fissure.     ESOPHAGOGASTRODUODENOSCOPY (EGD) WITH PROPOFOL  N/A 01/27/2020   Procedure: ESOPHAGOGASTRODUODENOSCOPY (EGD) WITH PROPOFOL ;  Surgeon: Shila Gustav GAILS, MD;  Location: [facility] END[facility];  Service: Endoscopy;  Laterality: N/A;   INSERTION OF PERMANENT INTRAPERITONEAL CANNULA/CATH N/A    LAPAROSCOPIC   IR  3D INDEPENDENT WKST  01/06/2020   IR ANGIO INTRA EXTRACRAN SEL INTERNAL CAROTID BILAT MOD SED  01/06/2020   IR ANGIO VERTEBRAL SEL VERTEBRAL UNI L MOD SED  01/06/2020   IR ANGIOGRAM FOLLOW UP STUDY  01/06/2020   IR ANGIOGRAM FOLLOW UP STUDY  01/06/2020   IR ANGIOGRAM FOLLOW UP STUDY  01/06/2020   IR ANGIOGRAM FOLLOW UP STUDY  01/06/2020   IR ANGIOGRAM FOLLOW UP STUDY  01/06/2020   IR ANGIOGRAM FOLLOW UP STUDY  01/06/2020   IR ANGIOGRAM FOLLOW UP STUDY  01/06/2020   IR ANGIOGRAM FOLLOW UP STUDY  01/06/2020   IR ANGIOGRAM FOLLOW UP STUDY  01/06/2020   IR ANGIOGRAM FOLLOW UP STUDY  01/06/2020   IR NEURO EACH ADD'L AFTER BASIC UNI RIGHT (MS)  01/06/2020   IR TRANSCATH/EMBOLIZ  01/06/2020   MASTECTOMY     MOHS SURGERY N/A    RADIOLOGY WITH ANESTHESIA N/A 01/06/2020   Procedure: IR WITH ANESTHESIA FOR ANEURYSM;  Surgeon: Lanis Pupa, MD;  Location: [facility] OR;  Service: Radiology;  Laterality: N/A;   STENT SUPPORTED EMBOLIZATION OF RIGHT ICA ANEURYSM Right    TUBAL LIGATION     VENTRICULO-PERITONEAL SHUNT PLACEMENT / LAPAROSCOPIC INSERTION PERITONEAL CATHETER N/A    Patient Active  Problem List   Diagnosis Date Noted   Dyspareunia in female 04/05/2020   History of abnormal cervical Pap smear 03/10/2020   GIB (gastrointestinal bleeding) 02/28/2020   Elevated BUN    Prediabetes    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   Cerebral vasospasm    Sinus tachycardia    Dysphagia, post-stroke    Thrombocytopenia    Acute blood loss anemia    Brain aneurysm    ICH (intracerebral hemorrhage) (HCC) 01/05/2020   History of adenomatous polyp of colon 02/07/2016   Anatomical narrow angle, bilateral 12/14/2014   Fissure in ano 11/11/2014   Hemorrhoids 11/11/2014   Constipation 11/19/2013   GERD (gastroesophageal reflux disease) 03/24/2013   Irritable bowel syndrome with diarrhea 03/24/2013   Herpes zoster 05/14/2005   Abnormal Pap smear of cervix 05/15/1983   History of cervical  dysplasia 05/15/1983    ONSET DATE: 12/02/2023  REFERRING DIAG: I63.9 (ICD-10-CM) - CVA (cerebral vascular accident) (HCC)   THERAPY DIAG:  Muscle weakness (generalized)  Other lack of coordination  Difficulty in walking, not elsewhere classified  Abnormality of gait and mobility  Repeated falls  Other abnormalities of gait and mobility  Rationale for Evaluation and Treatment: Rehabilitation  SUBJECTIVE:                                                                                                                                                                                             SUBJECTIVE STATEMENT: Pt states she feels okay today, no new updates   PERTINENT HISTORY:   Patient reports she was in hospital from [facility]- Sat (July 21st- 25th) with CVA affecting left side. I was supposed to have a knee replacement surgery in September but that is on hold for now. I was doing my prep for my colonoscopy and fell in the bathroom.  Patient reports having mild left sided weakness compared to her CVA in 2021 with heavy Right sided UE weakness. She reports bad OA affecting both knees as well. Previous CVA in 2021 affecting Right side. Reports going to ED  last Monday due to fall/CVA affecting Left side. Diagnosed with R CVA with Left Sided weakness.    PAIN:  Are you having pain? B pain knees, 3/10  PRECAUTIONS: Fall  WEIGHT BEARING RESTRICTIONS: No  FALLS: Has patient fallen in last 6 months? Yes. Number of falls fell with the stroke  PATIENT GOALS: Walk without any assistive device.  OBJECTIVE:  Note: Objective measures were completed at evaluation unless otherwise noted.  TREATMENT DATE : 05/04/2024  Nustep, rolling hill program, 10 min, levels 2-5, seat 10, arms 10 Forward/reverse gait 63ft x 2 bil  Standing on airex pad to move 1KG ball across  rail with BUE  Sit<>stand with BUE holding 1KG ball with BUE; airex pad in seat.   Gait for 377ft with SPC; standing rest break at 111ft.   CGA for safety throughout session unless other wise noted as well as mutliple therapeutic rest breaks between bouts due to fatigue and need for hydration.   PATIENT EDUCATION: Education details: PT plan of care; Discussion of symptoms of CVA and to go to ED as soon as possible. Purpose of PT for balance, strength, coordination and endurance.   Limitations of PT given need for knee replacement and need to continue to see progress to justify additional PT.    Person educated: Patient Education method: Explanation Education comprehension: verbalized understanding  HOME EXERCISE PROGRAM: Access Code: [email or web address] URL: [email or web address] Date: 12/17/2023 Prepared by: Sidra Simpers  Program Notes **Be sure to perform all exercises next to a countertop or sturdy piece of furniture in case you become unsteady.  Exercises - Standing Heel Raise with Support  - 1 x daily - 7 x weekly - 2 sets - 15 reps - Standing Toe Raises at Chair  - 1 x daily - 7 x weekly - 2 sets - 15 reps - Mini Squat with Counter Support  - 1 x daily - 7 x weekly - 2 sets - 15 reps - Lunge with Counter Support  - 1 x daily - 7 x weekly - 2 sets - 15 reps - Standing Tandem Balance with Counter Support  - 1 x daily - 7 x weekly - 2 sets - 2 reps - 30 hold - Standing Single Leg Stance with Counter Support  - 1 x daily - 7 x weekly - 2 sets - 2 reps - 30 hold  GOALS: Goals reviewed with patient? Yes  SHORT TERM GOALS: Target date: 01/21/2024  Pt will be independent with HEP in order to improve strength and balance in order to decrease fall risk and improve function at home.  Baseline: EVAL - No current formal HEP in place 03/04/2024- Patient reports independent with all current HEP and riding a stationary bike.  Goal status: MET   LONG TERM GOALS: Target date:  05/27/2024  1.  Patient will complete five times sit to stand test in < 15 seconds indicating an increased LE strength and improved balance. Baseline: EVAL= 25.20 sec with LUE Support 9/2: 23.36 without UE support and CGA-min assist. 20.10 sec with LUE support 03/04/2024- more difficulty raising without UE but performed 18.75 sec with LUE Support 04/06/24: 21.99s with L UE support on chair rail 12/4:16.59 sec with L UE assist   Goal status: IN PROGRESS   2.  Patient will increase Berg Balance score by > 6 points to demonstrate decreased fall risk during functional activities. Baseline: EVAL: To be assessed next visit; 12/12/2023= 42/56 9/2: 44/56  10/16:45/56 12/4: 47 Goal status: PROGRESSING   3.  Patient will reduce timed up and go to <11 seconds to reduce fall risk and demonstrate improved transfer/gait ability. Baseline: EVAL = 40 sec with RW 9/2: 18.33sec with SPC 10/16:17.47 sec with SPC 04/06/24: 20.17s w/ SPC 12/4:20.86 sec with SPC 16.58 sec with no AD  Goal status: PROGRESSING  4.  Patient will increase 10 meter walk test to >1.11m/s as to improve gait speed for better community ambulation and  to reduce fall risk. Baseline: EVAL: 0.65 m/s 9/2: 0.67 m/s with [facility] 9/9: 0.71 m/s without an AD and wearing 2.5 # AW 10/16: .75 m/s with [facility] 04/06/24: 0.51 m/s 12/4: .675 m/s with SPC, .70 m/s no AD  Goal status: PROGRESSING  5. Patient will increase six minute walk test distance by 200 feet for progression to community ambulator and improve gait ability Baseline: EVAL= 482' 9/2: 465 feet using a SPC with CGA required seated rest break at 4:70min   10/16: 542 ft and full 6 minutes  04/06/24: 525 ft using SPC with CGA, 3 rest breaks taken throughout, @ 2:31 min, 3:47min, and 5:51min 12/4:625 ft total mixed SPC and no SPC initial 300 ft no AD only one standing rest break allowed around 400 ft, encouraged pt to not rest during test.  Goal status: PROGRESS     ASSESSMENT:  CLINICAL IMPRESSION:  Initiated conversation on difficulties with continued PT given profound knee pain and known need for knee replacement. Pt understands that continued progress is required for continued PT. To be assessed at next progress note. Was noted to have reduced limiting behaviors on this day day; but reduced ambulatory distance performed compared to prior sessions. Pt increased use of RUE with functional mobility and balance tasks on this day, but hesitant due to feeling frustrated about lack of grip control on the R side. Pt will continue to benefit from skilled therapy to address remaining deficits in order to improve overall QoL and return to PLOF.   OBJECTIVE IMPAIRMENTS: Abnormal gait, decreased activity tolerance, decreased balance, decreased coordination, decreased endurance, decreased mobility, difficulty walking, decreased ROM, decreased strength, impaired UE functional use, and pain.   ACTIVITY LIMITATIONS: carrying, lifting, bending, sitting, standing, squatting, sleeping, stairs, and transfers  PARTICIPATION LIMITATIONS: meal prep, cleaning, laundry, driving, shopping, community activity, and yard work  PERSONAL FACTORS: 1-2 comorbidities: OA, previous CVA with significant R sided weakness are also affecting patient's functional outcome.   REHAB POTENTIAL: Good  CLINICAL DECISION MAKING: Stable/uncomplicated  EVALUATION COMPLEXITY: Moderate  PLAN:  PT FREQUENCY: 1-2x/week  PT DURATION: 12 weeks  PLANNED INTERVENTIONS: [id number]- PT Re-evaluation, 97750- Physical Performance Testing, 97110-Therapeutic exercises, 97530- Therapeutic activity, [id number]- Neuromuscular re-education, 97535- Self Care, [id number]- Manual therapy, [id number]- Gait training, [id number]- Orthotic Initial, [id number]- Orthotic/Prosthetic subsequent, [phone]- Canalith repositioning, [id number]- Electrical stimulation (manual), [phone] (1-2 muscles), [id number] (3+ muscles)- Dry Needling, Patient/Family education, Balance  training, Stair training, Taping, Joint mobilization, Joint manipulation, Spinal manipulation, Spinal mobilization, Compression bandaging, Vestibular training, DME instructions, Cryotherapy, and Moist heat   PLAN FOR NEXT SESSION:   -continue ambulation distance and activity tolerance -general LE strengthening - dynamic balance training.    Massie Dollar PT, DPT  Physical Therapist - [place]  [facility]  3:57 PM 05/04/2024     "

## 2024-05-05 NOTE — Therapy (Signed)
 "  Outpatient Occupational Therapy Neuro Treatment Note  Patient Name: Erika Stanton MRN: [id number] DOB:[birth year], 68 y.o., female Today's Date: 05/05/2024  PCP: Laurice Anes, MD REFERRING PROVIDER: Laurice Anes, MD   OT End of Session - 05/05/24 2148     Visit Number 39    Number of Visits 48    Date for Recertification  05/25/24    Authorization Time Period Reporting period beginning 03/30/24    OT Start Time 1448    OT Stop Time 1530    OT Time Calculation (min) 42 min    Equipment Utilized During Treatment transport chair, SPC    Activity Tolerance Patient tolerated treatment well    Behavior During Therapy WFL for tasks assessed/performed         Past Medical History:  Diagnosis Date   Anatomical narrow angle, bilateral    Aneurysm    Asthma    Cerebral aneurysm    Cerebral vasospasm    Cognitive change    Dysphagia, post-stroke    GERD (gastroesophageal reflux disease)    Hemorrhoids    History of cervical dysplasia    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 no recurrent polyps.  suggest repeat surveillance study 11/2022.     COLONOSCOPY W/ POLYPECTOMY  06/2014   Dr Elder at [facility].  3 adenomatoous polyps, anal fissure.     ESOPHAGOGASTRODUODENOSCOPY (EGD) WITH PROPOFOL  N/A 01/27/2020   Procedure: ESOPHAGOGASTRODUODENOSCOPY (EGD) WITH PROPOFOL ;  Surgeon: Shila Gustav GAILS, MD;  Location: [facility] ENDOSCOPY;  Service: Endoscopy;  Laterality: N/A;   INSERTION OF PERMANENT INTRAPERITONEAL CANNULA/CATH N/A    LAPAROSCOPIC   IR 3D  INDEPENDENT WKST  01/06/2020   IR ANGIO INTRA EXTRACRAN SEL INTERNAL CAROTID BILAT MOD SED  01/06/2020   IR ANGIO VERTEBRAL SEL VERTEBRAL UNI L MOD SED  01/06/2020   IR ANGIOGRAM FOLLOW UP STUDY  01/06/2020   IR ANGIOGRAM FOLLOW UP STUDY  01/06/2020   IR ANGIOGRAM FOLLOW UP STUDY  01/06/2020   IR ANGIOGRAM FOLLOW UP STUDY  01/06/2020   IR ANGIOGRAM FOLLOW UP STUDY  01/06/2020   IR ANGIOGRAM FOLLOW UP STUDY  01/06/2020   IR ANGIOGRAM FOLLOW UP STUDY  01/06/2020   IR ANGIOGRAM FOLLOW UP STUDY  01/06/2020   IR ANGIOGRAM FOLLOW UP STUDY  01/06/2020   IR ANGIOGRAM FOLLOW UP STUDY  01/06/2020   IR NEURO EACH ADD'L AFTER BASIC UNI RIGHT (MS)  01/06/2020   IR TRANSCATH/EMBOLIZ  01/06/2020   MASTECTOMY     MOHS SURGERY N/A    RADIOLOGY WITH ANESTHESIA N/A 01/06/2020   Procedure: IR WITH ANESTHESIA FOR ANEURYSM;  Surgeon: Lanis Pupa, MD;  Location: [facility] OR;  Service: Radiology;  Laterality: N/A;   STENT SUPPORTED EMBOLIZATION OF RIGHT ICA ANEURYSM Right    TUBAL LIGATION     VENTRICULO-PERITONEAL SHUNT PLACEMENT / LAPAROSCOPIC INSERTION PERITONEAL CATHETER N/A    Patient Active Problem List   Diagnosis Date Noted   Dyspareunia in female 04/05/2020  History of abnormal cervical Pap smear 03/10/2020   GIB (gastrointestinal bleeding) 02/28/2020   Elevated BUN    Prediabetes    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   Cerebral vasospasm    Sinus tachycardia    Dysphagia, post-stroke    Thrombocytopenia    Acute blood loss anemia    Brain aneurysm    ICH (intracerebral hemorrhage) (HCC) 01/05/2020   History of adenomatous polyp of colon 02/07/2016   Anatomical narrow angle, bilateral 12/14/2014   Fissure in ano 11/11/2014   Hemorrhoids 11/11/2014   Constipation 11/19/2013   GERD (gastroesophageal reflux disease) 03/24/2013   Irritable bowel syndrome with diarrhea 03/24/2013   Herpes zoster 05/14/2005   Abnormal Pap smear of cervix 05/15/1983   History of cervical dysplasia  05/15/1983   ONSET DATE: 12/02/2023  REFERRING DIAG: L ischemic CVA  THERAPY DIAG:  Muscle weakness (generalized)  Other lack of coordination  Rationale for Evaluation and Treatment: Rehabilitation  SUBJECTIVE:  SUBJECTIVE STATEMENT: Pt reports needing to work on organizing her wallet to access her cards more easily.   PERTINENT HISTORY:  Pt. was admitted to [facility] from 12/02/23-12/06/23 with an Acute Right MCA CVA, Hydrocephalus with shunt  placement. History of Subarachnoid Hemorrhage with bilateral ICA coiled aneurysms. 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  PRECAUTIONS: None  WEIGHT BEARING RESTRICTIONS: No  PAIN: 05/04/24: No UE pain, 3-4/10 in knees Are you having pain? No  FALLS: Has patient fallen in last 6 months? Yes. Number of falls 1  LIVING ENVIRONMENT: Lives with: lives with their family and lives with their spouse Lives in: House/apartment Stairs: hand rails, 2 steps   Has following equipment at home: [name], cane  PLOF: Independent with basic ADLs, Spouse help as needed  PATIENT GOALS: Strengthening  OBJECTIVE:  Note: Objective measures were completed at Evaluation unless otherwise noted.  HAND DOMINANCE: Left  ADLs: Overall ADLs: spouse assists when needed, Pt. reports that she can do most tasks herself. Uses her left hand to engage in self-care tasks. Eating: Able to cut foods with a fork, husband assists as needed.  Grooming: husband assists with applying toothpaste, able to brush teeth and hair UB Dressing: Spouse assists with applying/fastening bra, Pt. Requires assistance with zippers, and buttons. LB Dressing: Spouse assists with tying shoes when needed, uses slip on  shoes. Pt. Does not zip pants. Toileting: Able to complete independently Bathing: Pt. Requires intermittent assistance with bathing tasks. Tub Shower transfers: Spouse assists with transfers when/if needed Equipment: Shower seat with back  IADLs: Shopping: Spouse typically does the shopping, isnt able to carry heavy objects.  Light housekeeping: Spouse typically does most of the cleaning around the home, Pt. Cleans bathroom, and laundry Pt. Is unable to carry laundry due to exacerbation of weakness.  Meal Prep: Spouse typically cooks and prepares meals.  Community mobility: Pt. Requires assistance using walker. Pt. reports that she is unable to carry heavy items, and typically does not do the shopping.  Medication management: Pt. is able to take care of meds with pillbox Financial management:  Spouse takes care of it, and makes online payments.  Handwriting: 50% legible  MOBILITY STATUS: Needs Assist: Pt. Requires use of a walker   POSTURE COMMENTS:  No Significant postural limitations Sitting balance: Good  ACTIVITY TOLERANCE: Activity tolerance:   FUNCTIONAL OUTCOME MEASURES: TBD  UPPER EXTREMITY ROM:    Active ROM Right eval Right 01/16/24 Right 03/02/24 Right 04/01/24  Left eval Left 01/16/24 Left 03/02/24 Left 04/01/24  Shoulder flexion 94(121) 100 with scaption (132) 126(146) 100 some scaption (145) 102(125) 134 with scaption (145) 142(148) 140 (155)  Shoulder abduction 75(106) 74 (98) 93(120) 58(90) 110(126) 140 (170) WNL 165 (180)  Shoulder adduction          Shoulder extension          Shoulder internal rotation          Shoulder external rotation          Elbow flexion 135(155)  150(155)  140(150)  150   Elbow extension 0  0  -10(-9)  -4(0)   Wrist flexion 40(44)  48(60)  42(58)  52(60)   Wrist extension 30(34)  48(60)  40(50)  52(56)   Wrist ulnar deviation          Wrist radial deviation          Wrist pronation          Wrist supination          (Blank  rows = not tested)  UPPER EXTREMITY MMT:     MMT Right eval Right 03/02/24 Right 04/01/24 Left eval Left 03/02/24 Left 04/01/24  Shoulder flexion 3-/5 3+/5 3- 3-/5 4/5 4/5  Shoulder abduction 3-/5 3-/5 3- 3-/5 4/5 4/5  Shoulder adduction        Shoulder extension        Shoulder internal rotation        Shoulder external rotation        Middle trapezius        Lower trapezius        Elbow flexion 4-/5 4+/5 4+ 4+/5 5/5 5/5  Elbow extension 4-/5 4+/5 4/5 4+/5 5/5 5/5  Wrist flexion 4-/5 4-/5 4/5 3+/5 4/5 4+/5  Wrist extension 4-/5 4-/5 4- 3+/5 4/5 4+/5  Wrist ulnar deviation        Wrist radial deviation        Wrist pronation        Wrist supination        (Blank rows = not tested)  HAND FUNCTION Eval:  Grip strength: Right: N/T lbs; Left: 43 lbs, Lateral pinch: Right: N/T lbs, Left: 9 lbs, and 3 point pinch: Right: N/T lbs, Left: 5 lbs Pt. Presents with flexor tightness in the right 5th digit, however is able to passively extend 5th digit within normal range. Pt. Also Presents with hyper extension in the 2nd and 3rd digit at PIP joints. Pt. Is able to achieve full Digit MP, PIP, and DIP PROM.  01/16/24: Grip strength: Left: 33 lbs; Lateral pinching: Left: 8 lbs; 3 point pinch: Left: 3 lbs  03/02/24: Grip strength: Left: 36 lbs; Lateral pinching: Left: 8 lbs; 3 point pinch: Left: 3 lbs  04/01/24: Grip strength: Left: (3 trials) 32 lbs, 26 lbs, 34 lbs; Lateral pinch: Pinch gauge unavailable (out for re-calibration)  COORDINATION: 9 Hole Peg test: Right: N/T sec; Left: 51 sec 01/16/24: Left: 1 min 1 sec 03/02/24:  Left: 1 min 1 sec 04/01/24: Left: 58 sec  SENSATION: Not tested  EDEMA:   MUSCLE TONE: Fluctuating flexor tone in R hand.  COGNITION: Overall cognitive status: Within functional limits for tasks assessed  VISION: Subjective report:  Baseline vision:  Visual history:   VISION ASSESSMENT:  PERCEPTION:   PRAXIS:  OBSERVATIONS: Pt. presents with  fluctuating flexor tone in the R hand, involving the 5th digit at the PIP/DIP joint. Pt. presents with hyperextension at the 2nd and 3rd digits at the PIP joints.                                                                                                                    TREATMENT DATE: 05/04/24 Self Care: -Reviewed purse positioning strategies to ease zipping purse open/closed, requiring min vc for positioning purse to pull zipper away from pt rather than toward, and engaging R hand as a stabilizer to purse in lap. -Participation in organizational strategies to ease access to frequently used bank cards in wallet  -Practiced removing cards from various pockets to identify easiest access points  Therapeutic Activity: -Placed tape tags on bank cards in wallet for easing ability to pull cards from wallet sleeves, with practice trials to ensure effectiveness.  Repositioned tabs as needed.  PATIENT EDUCATION: Education details: strategies to ease access to cards in wallet  Person educated: Patient Education method: Explanation and Verbal cues Education comprehension: verbal cues required  HOME EXERCISE PROGRAM: -Pink theraputty; visual handout issued -Self and caregiver assisted PROM to R hand and wrist -Monitor splint with skin checks  GOALS: Goals reviewed with patient? Yes  SHORT TERM GOALS: Target date: 04/13/2024   Pt. Will be independent with HEP for UE functioning. Baseline: Eval: No current HEP; 01/16/24: pt reports doing a little writing and squeezing a ball, but denies using putty; spouse stretches RUE daily 02/20/24:  Pt. Continues to do a little writing and squeezing a ball, but denies using putty; spouse stretches RUE 03/02/24: Pt. Husband assists Pt. With ROM to the right hand. Pt. reports doing hand exercises; 03/30/24: Spouse indep with HEP (spouse assists daily) Goal status: achieved  LONG TERM GOALS: Target date: 05/25/2024  Pt. Will improve [facility] skills to be able to  manipulate small objects at home Baseline: 9 hole peg test: L: 51 seconds; 01/16/24: L 1 min 1 sec, 03/02/24: Left: 1 min. & 1 sec. Pt. Continues to work towards using the left to manipulate small objects; 04/01/24: L 58 sec (multiple trials to improve time) Goal status: Ongoing  2.  Pt. Will increase BUE shoulder ROM by 10 degrees to be able to reach into cabinets and shelves.  Baseline: Shoulder flexion: R: 94(121), L: 102(125), Shoulder abduction: R: 75(106), L: 110(126); 01/16/24: Shoulder flexion: R: 100 with scaption (132), L: 134 with scaption (145), Shoulder abduction: R: 74 (98), L: 140 (170)02/20/24: Pt. Is improving with functional reaching through multiple planes with cues, and assist required. 03/02/24: Shoulder flexion: R: 126(146), L: 142(148), Shoulder abduction: R: 93(120), L: WNL; 03/30/24: ROM to be updated next session; limited by time constraints with poc review, though pt reports she does not reach into cabinets; 04/01/24: Shoulder flexion: R: 100 (145), L: 140 (155); Shoulder abd: R: 50 (90), L: 165 (180); pt reports she does not reach into cabinets Goal status: Ongoing  3.  Pt.  Will improve L grip strength by 5# to be able to securely grasp ADL items at home.  Baseline: Grip strength: R: NT, L: 43#; 01/16/24: L: 33#  03/02/24: 36#; 04/01/24: L: 34 lbs (best of 3 trials) Goal status: Ongoing  4.  Pt. Will increase L lateral key pinch strength by 3#  to open wide mouth jars.  Baseline: Lateral key pinch: R: NT, L: 9#; 01/16/24: L: 8# 03/02/24: Pinch meter is out for calibration. Pt. Continues to have difficulty opening jars; 04/01/24: NT (pinch gauge not available); continued difficulty opening jars Goal status: Ongoing  5.  Pt. Will independently engage the right hand as a gross assist to the left hand 100% of the time during ADLs/IADLs. Baseline: Eval: Pt. With limited engagement of the right hand as a gross assist during tasks; 01/16/24: Observed <25% of the time during OT sessions  02/20/24: Pt. Continues to present with limited engagement of the RUE as a gross assist to the left hand approximately 25% of the time with cues. 03/02/24: Pt. Is starting to engage her right hand more as a gross assist to the left; 04/01/24: Pt engages the R hand as a fair assist to the L with intermittent vc from OT during tx sessions; pt admits she needs to use the R hand more at home. Goal status: Ongoing  6.  Pt. Will be able to manipulate zippers, and buttons efficiently with modified independence Baseline: Eval:Right: NT Left: 51 sec; 01/16/24: Left: 1 min and 1 sec 02/20/24: Pt. continues to have difficulty manipulating zippers and buttons.03/02/24: Pt. continues to have difficulty manipulating zippers, and buttons, and may benefit from reviewing adaptive devices; 04/01/24: Pt reports that she continues to avoid clothes with fasteners Goal status: Ongoing   7. Pt. Will improve handwriting legibility to 100% for one sentence in printed form for written correspondence.  Baseline: 50% legible in printed form for first name only; 01/16/24: 80-90% legible to print first name 1 of 4 attempts.  3 of 4 attempts 50% or less legible 02/20/24: first name only 100%, 25% for lists of words. 03/02/24:  first name only 100%, 25% for lists of words; 04/01/24: 100% legibility for a short 3 word sentence using L hand.  Goal status: achieved  8. Pt. Will independently, and efficiently transfer small 1/2 objects from the left hand to the right hand in midline in preparation from reaching up  to place items at a target with 100% accuracy with the right hand. Baseline: 03/02/24: Pt. Has progressed to transferring 1.5 objects between her hand in midline, and reach up to place them at targets with 60% accuracy; 03/30/24: Pt can transfer jumbo pegs and clothespins (~1/5) from R to L hand with extra time, repeat trials, and occasional dropping. Pt continues to reach toward a target with ~60% accuracy.  Goal Status:  ongoing  9. Pt. Will increased BUE strength by 2 mm grades to assist with ADLs, and IADLs.  Baseline: 03/02/24: UE strength: Shoulder flexion: R: 3+/5 L: 4/5, abduction: R: 3-/5, L 4/5:  elbow flexion: R:4+/5, L: 5/5, extension: R: 4+/5, L:5/5, wrist flexion: R: 4-/5  L: 4/5 wrist extension: R: 4-/5 L: 4/5; 04/01/24: see chart; no significant changes  Goal Status: New: 03/02/24  ASSESSMENT: CLINICAL IMPRESSION: Pt found tape tags effective for easier access to bank cards in wallet d/t difficulty pinching and pulling card edges from a tight wallet sleeve, and was able to identify preferred wallet sleeves for quick access to cards.  Pt continues to  require increased time and min vc for purse positioning for easier manipulation of purse zipper.  Pt will continue to benefit from skilled OT to address above noted goals, working towards increased indep with daily tasks and reduced burden of care on caregiver.   PERFORMANCE DEFICITS: in functional skills including ADLs, IADLs, coordination, dexterity, edema, tone, ROM, strength, pain, Fine motor control, Gross motor control, endurance, and UE functional use, and psychosocial skills including coping strategies, environmental adaptation, habits, and routines and behaviors.   IMPAIRMENTS: are limiting patient from ADLs, IADLs, rest and sleep, leisure, and social participation.   CO-MORBIDITIES: may have co-morbidities  that affects occupational performance. Patient will benefit from skilled OT to address above impairments and improve overall function.  MODIFICATION OR ASSISTANCE TO COMPLETE EVALUATION: Min-Moderate modification of tasks or assist with assess necessary to complete an evaluation.  OT OCCUPATIONAL PROFILE AND HISTORY: Detailed assessment: Review of records and additional review of physical, cognitive, psychosocial history related to current functional performance.  CLINICAL DECISION MAKING: Moderate - several treatment options, min-mod task  modification necessary  REHAB POTENTIAL: Good  EVALUATION COMPLEXITY: Moderate  PLAN:  OT FREQUENCY: 2x/week  OT DURATION: 12 weeks  PLANNED INTERVENTIONS: 97168 OT Re-evaluation, 97535 self care/ADL training, 02889 therapeutic exercise, 97530 therapeutic activity, 97112 neuromuscular re-education, 97140 manual therapy, 97018 paraffin, 02960 fluidotherapy, 97010 moist heat, 97010 cryotherapy, 97034 contrast bath, 97032 electrical stimulation (manual), 97760 Orthotic Initial, passive range of motion, energy conservation, coping strategies training, patient/family education, and DME and/or AE instructions  RECOMMENDED OTHER SERVICES: PT   CONSULTED AND AGREED WITH PLAN OF CARE: Patient  PLAN FOR NEXT SESSION: see above  Inocente Blazing, MS, OTR/L     "

## 2024-05-06 ENCOUNTER — Ambulatory Visit

## 2024-05-06 ENCOUNTER — Encounter: Admitting: Speech Pathology

## 2024-05-06 ENCOUNTER — Ambulatory Visit: Admitting: Physical Therapy

## 2024-05-11 ENCOUNTER — Ambulatory Visit: Admitting: Physical Therapy

## 2024-05-11 ENCOUNTER — Encounter: Admitting: Speech Pathology

## 2024-05-11 ENCOUNTER — Ambulatory Visit

## 2024-05-13 ENCOUNTER — Encounter: Admitting: Speech Pathology

## 2024-05-13 ENCOUNTER — Ambulatory Visit: Admitting: Physical Therapy

## 2024-05-13 ENCOUNTER — Ambulatory Visit

## 2024-05-13 DIAGNOSIS — M6281 Muscle weakness (generalized): Secondary | ICD-10-CM

## 2024-05-13 DIAGNOSIS — R278 Other lack of coordination: Secondary | ICD-10-CM

## 2024-05-13 DIAGNOSIS — R269 Unspecified abnormalities of gait and mobility: Secondary | ICD-10-CM

## 2024-05-13 DIAGNOSIS — R2689 Other abnormalities of gait and mobility: Secondary | ICD-10-CM

## 2024-05-13 DIAGNOSIS — R262 Difficulty in walking, not elsewhere classified: Secondary | ICD-10-CM

## 2024-05-13 DIAGNOSIS — R296 Repeated falls: Secondary | ICD-10-CM

## 2024-05-13 NOTE — Therapy (Signed)
 " OUTPATIENT PHYSICAL THERAPY TREATMENT   Patient Name: Erika Stanton MRN: [id number] DOB:01-Dec-1955, 68 y.o., female Today's Date: 05/13/2024  PCP: Dr. Oneil Galloway  REFERRING PROVIDER: Dr. Oneil Galloway  END OF SESSION:   PT End of Session - 05/13/24 1445     Visit Number 38    Number of Visits 45    Date for Recertification  05/27/24    Authorization Type [employer]    Progress Note Due on Visit 40    PT Start Time 1446    PT Stop Time 1525    PT Time Calculation (min) 39 min    Equipment Utilized During Treatment Gait belt    Activity Tolerance Patient tolerated treatment well    Behavior During Therapy WFL for tasks assessed/performed               Past Medical History:  Diagnosis Date   Anatomical narrow angle, bilateral    Aneurysm    Asthma    Cerebral aneurysm    Cerebral vasospasm    Cognitive change    Dysphagia, post-stroke    GERD (gastroesophageal reflux disease)    Hemorrhoids    History of cervical dysplasia    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 no recurrent polyps.  suggest repeat surveillance study 11/2022.     COLONOSCOPY W/ POLYPECTOMY  06/2014   Dr Elder at [facility].  3 adenomatoous polyps, anal fissure.     ESOPHAGOGASTRODUODENOSCOPY (EGD) WITH PROPOFOL  N/A 01/27/2020   Procedure: ESOPHAGOGASTRODUODENOSCOPY (EGD) WITH PROPOFOL ;  Surgeon: Shila Gustav GAILS, MD;  Location: [facility] END[facility]COPY;  Service: Endoscopy;  Laterality: N/A;   INSERTION OF PERMANENT INTRAPERITONEAL CANNULA/CATH N/A    LAPAROSCOPIC   IR  3D INDEPENDENT WKST  01/06/2020   IR ANGIO INTRA EXTRACRAN SEL INTERNAL CAROTID BILAT MOD SED  01/06/2020   IR ANGIO VERTEBRAL SEL VERTEBRAL UNI L MOD SED  01/06/2020   IR ANGIOGRAM FOLLOW UP STUDY  01/06/2020   IR ANGIOGRAM FOLLOW UP STUDY  01/06/2020   IR ANGIOGRAM FOLLOW UP STUDY  01/06/2020   IR ANGIOGRAM FOLLOW UP STUDY  01/06/2020   IR ANGIOGRAM FOLLOW UP STUDY  01/06/2020   IR ANGIOGRAM FOLLOW UP STUDY  01/06/2020   IR ANGIOGRAM FOLLOW UP STUDY  01/06/2020   IR ANGIOGRAM FOLLOW UP STUDY  01/06/2020   IR ANGIOGRAM FOLLOW UP STUDY  01/06/2020   IR ANGIOGRAM FOLLOW UP STUDY  01/06/2020   IR NEURO EACH ADD'L AFTER BASIC UNI RIGHT (MS)  01/06/2020   IR TRANSCATH/EMBOLIZ  01/06/2020   MASTECTOMY     MOHS SURGERY N/A    RADIOLOGY WITH ANESTHESIA N/A 01/06/2020   Procedure: IR WITH ANESTHESIA FOR ANEURYSM;  Surgeon: Lanis Pupa, MD;  Location: [facility] OR;  Service: Radiology;  Laterality: N/A;   STENT SUPPORTED EMBOLIZATION OF RIGHT ICA ANEURYSM Right    TUBAL LIGATION     VENTRICULO-PERITONEAL SHUNT PLACEMENT / LAPAROSCOPIC INSERTION PERITONEAL CATHETER N/A    Patient Active  Problem List   Diagnosis Date Noted   Dyspareunia in female 04/05/2020   History of abnormal cervical Pap smear 03/10/2020   GIB (gastrointestinal bleeding) 02/28/2020   Elevated BUN    Prediabetes    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   Cerebral vasospasm    Sinus tachycardia    Dysphagia, post-stroke    Thrombocytopenia    Acute blood loss anemia    Brain aneurysm    ICH (intracerebral hemorrhage) (HCC) 01/05/2020   History of adenomatous polyp of colon 02/07/2016   Anatomical narrow angle, bilateral 12/14/2014   Fissure in ano 11/11/2014   Hemorrhoids 11/11/2014   Constipation 11/19/2013   GERD (gastroesophageal reflux disease) 03/24/2013   Irritable bowel syndrome with diarrhea 03/24/2013   Herpes zoster 05/14/2005   Abnormal Pap smear of cervix 05/15/1983   History of cervical  dysplasia 05/15/1983    ONSET DATE: 12/02/2023  REFERRING DIAG: I63.9 (ICD-10-CM) - CVA (cerebral vascular accident) (HCC)   THERAPY DIAG:  Muscle weakness (generalized)  Other lack of coordination  Difficulty in walking, not elsewhere classified  Abnormality of gait and mobility  Repeated falls  Other abnormalities of gait and mobility  Rationale for Evaluation and Treatment: Rehabilitation  SUBJECTIVE:                                                                                                                                                                                             SUBJECTIVE STATEMENT:   Pt states she feels okay today, no new updates. Feels like her deficits are stroke and OA related. Is unsure if the CVA or OA is more problematic.    PERTINENT HISTORY:   Patient reports she was in hospital from [facility]- Sat (July 21st- 25th) with CVA affecting left side. I was supposed to have a knee replacement surgery in September but that is on hold for now. I was doing my prep for my colonoscopy and fell in the bathroom.  Patient reports having mild left sided weakness compared to her CVA in 2021 with heavy Right sided UE weakness. She reports bad OA affecting both knees as well. Previous CVA in 2021 affecting Right side. Reports going to ED  last Monday due to fall/CVA affecting Left side. Diagnosed with R CVA with Left Sided weakness.    PAIN:  Are you having pain? B pain knees, 3/10  PRECAUTIONS: Fall  WEIGHT BEARING RESTRICTIONS: No  FALLS: Has patient fallen in last 6 months? Yes. Number of falls fell with the stroke  PATIENT GOALS: Walk without any assistive device.  OBJECTIVE:  Note: Objective measures were completed at evaluation unless otherwise  noted.                                                                                                                              TREATMENT DATE : 05/13/2024  Nustep, rolling hill program, 7 min, levels  2-5, seat 10, arms 10  Seated therex:  HS curl x 12 bil RTB  AROM LAQ x 12 bil  Hip abduction RTB x 12 bil  Sit<>stand 2x 8  Isometric hip adduction to squeeze bolster x 15   Gait with walking stick. 2 x 173ft and 33ft to transport chair.  Gait with upright walker x 147ft.  Gait with rollator x 78ft.   Pt reports that she does not feel comfortable with upright walker or rollator. Education for need to be able to utilize BUE on walker with expectation for knee replacement within the next year.  Was noted to have improved posture with use of walking stick compared to use of SPC in prior sessions.    CGA for safety throughout session unless other wise noted as well as mutliple therapeutic rest breaks between bouts due to fatigue and need for hydration.   PATIENT EDUCATION: Education details: PT plan of care; Discussion of symptoms of CVA and to go to ED as soon as possible. Purpose of PT for balance, strength, coordination and endurance.   Limitations of PT given need for knee replacement and need to continue to see progress to justify additional PT.    Person educated: Patient Education method: Explanation Education comprehension: verbalized understanding  HOME EXERCISE PROGRAM: Access Code: [email or web address] URL: [email or web address] Date: 12/17/2023 Prepared by: Sidra Simpers  Program Notes **Be sure to perform all exercises next to a countertop or sturdy piece of furniture in case you become unsteady.  Exercises - Standing Heel Raise with Support  - 1 x daily - 7 x weekly - 2 sets - 15 reps - Standing Toe Raises at Chair  - 1 x daily - 7 x weekly - 2 sets - 15 reps - Mini Squat with Counter Support  - 1 x daily - 7 x weekly - 2 sets - 15 reps - Lunge with Counter Support  - 1 x daily - 7 x weekly - 2 sets - 15 reps - Standing Tandem Balance with Counter Support  - 1 x daily - 7 x weekly - 2 sets - 2 reps - 30 hold - Standing Single Leg Stance with Counter Support  -  1 x daily - 7 x weekly - 2 sets - 2 reps - 30 hold  GOALS: Goals reviewed with patient? Yes  SHORT TERM GOALS: Target date: 01/21/2024  Pt will be independent with HEP in order to improve strength and balance in order to decrease fall risk and improve function at home.  Baseline: EVAL - No current formal HEP in place 03/04/2024- Patient reports independent with all current HEP and riding a stationary bike.  Goal status: MET  LONG TERM GOALS: Target date: 05/27/2024  1.  Patient will complete five times sit to stand test in < 15 seconds indicating an increased LE strength and improved balance. Baseline: EVAL= 25.20 sec with LUE Support 9/2: 23.36 without UE support and CGA-min assist. 20.10 sec with LUE support 03/04/2024- more difficulty raising without UE but performed 18.75 sec with LUE Support 04/06/24: 21.99s with L UE support on chair rail 12/4:16.59 sec with L UE assist   Goal status: IN PROGRESS   2.  Patient will increase Berg Balance score by > 6 points to demonstrate decreased fall risk during functional activities. Baseline: EVAL: To be assessed next visit; 12/12/2023= 42/56 9/2: 44/56  10/16:45/56 12/4: 47 Goal status: PROGRESSING   3.  Patient will reduce timed up and go to <11 seconds to reduce fall risk and demonstrate improved transfer/gait ability. Baseline: EVAL = 40 sec with RW 9/2: 18.33sec with SPC 10/16:17.47 sec with SPC 04/06/24: 20.17s w/ SPC 12/4:20.86 sec with SPC 16.58 sec with no AD  Goal status: PROGRESSING  4.  Patient will increase 10 meter walk test to >1.40m/s as to improve gait speed for better community ambulation and to reduce fall risk. Baseline: EVAL: 0.65 m/s 9/2: 0.67 m/s with [facility] 9/9: 0.71 m/s without an AD and wearing 2.5 # AW 10/16: .75 m/s with [facility] 04/06/24: 0.51 m/s 12/4: .675 m/s with SPC, .70 m/s no AD  Goal status: PROGRESSING  5. Patient will increase six minute walk test distance by 200 feet for progression to community  ambulator and improve gait ability Baseline: EVAL= 482' 9/2: 465 feet using a SPC with CGA required seated rest break at 4:21min   10/16: 542 ft and full 6 minutes  04/06/24: 525 ft using SPC with CGA, 3 rest breaks taken throughout, @ 2:31 min, 3:52min, and 5:39min 12/4:625 ft total mixed SPC and no SPC initial 300 ft no AD only one standing rest break allowed around 400 ft, encouraged pt to not rest during test.  Goal status: PROGRESS    ASSESSMENT:  CLINICAL IMPRESSION:  PT treatment focused on BLE strengthening as well as AD training to improve stance while on the LLE. Pt demonstrated reduced antalgic gait pattern on the LLE with use of upright and rollator but states that they are both uncomfortable compared to [facility] or walking stick. Pt is noted to have increased step length and posture with walking stick compared to use of SPC. Pt continues to be extremely limited by knee pain preventing gait for >180ft on this day, but states that she feels like the weakness is still CVA related, rather than OA related.   Pt will continue to benefit from skilled therapy to address remaining deficits in order to improve overall QoL and return to PLOF.   OBJECTIVE IMPAIRMENTS: Abnormal gait, decreased activity tolerance, decreased balance, decreased coordination, decreased endurance, decreased mobility, difficulty walking, decreased ROM, decreased strength, impaired UE functional use, and pain.   ACTIVITY LIMITATIONS: carrying, lifting, bending, sitting, standing, squatting, sleeping, stairs, and transfers  PARTICIPATION LIMITATIONS: meal prep, cleaning, laundry, driving, shopping, community activity, and yard work  PERSONAL FACTORS: 1-2 comorbidities: OA, previous CVA with significant R sided weakness are also affecting patient's functional outcome.   REHAB POTENTIAL: Good  CLINICAL DECISION MAKING: Stable/uncomplicated  EVALUATION COMPLEXITY: Moderate  PLAN:  PT FREQUENCY: 1-2x/week  PT  DURATION: 12 weeks  PLANNED INTERVENTIONS: 97164- PT Re-evaluation, 97750- Physical Performance Testing, 97110-Therapeutic exercises, 97530- Therapeutic activity, [id number]- Neuromuscular re-education, 97535- Self Care, [id number]- Manual  therapy, [id number]- Gait training, 02239- Orthotic Initial, [phone]- Orthotic/Prosthetic subsequent, [phone]- Canalith repositioning, 02967- Electrical stimulation (manual), [phone] (1-2 muscles), [id number] (3+ muscles)- Dry Needling, Patient/Family education, Balance training, Stair training, Taping, Joint mobilization, Joint manipulation, Spinal manipulation, Spinal mobilization, Compression bandaging, Vestibular training, DME instructions, Cryotherapy, and Moist heat   PLAN FOR NEXT SESSION:   -continue ambulation distance and activity tolerance -general LE strengthening - dynamic balance training.  - expand HEP   Massie Dollar PT, DPT  Physical Therapist - [place]  [facility]  2:46 PM 05/13/2024     "

## 2024-05-13 NOTE — Therapy (Signed)
 "  Outpatient Occupational Therapy Neuro Progress and Recertification Note Reporting period beginning 03/30/24-05/13/24  Patient Name: Erika Stanton MRN: [id number] DOB:1956/03/08, 68 y.o., female Today's Date: 05/13/2024  PCP: Laurice Anes, MD REFERRING PROVIDER: Laurice Anes, MD   OT End of Session - 05/13/24 1407     Visit Number 40    Number of Visits 50    Date for Recertification  06/19/24    Authorization Time Period Reporting period beginning 03/30/24-05/13/24    Progress Note Due on Visit 50    OT Start Time 1400    OT Stop Time 1445    OT Time Calculation (min) 45 min    Equipment Utilized During Treatment transport chair, walking stick    Activity Tolerance Patient tolerated treatment well    Behavior During Therapy WFL for tasks assessed/performed         Past Medical History:  Diagnosis Date   Anatomical narrow angle, bilateral    Aneurysm    Asthma    Cerebral aneurysm    Cerebral vasospasm    Cognitive change    Dysphagia, post-stroke    GERD (gastroesophageal reflux disease)    Hemorrhoids    History of cervical dysplasia    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 no recurrent polyps.  suggest repeat surveillance study 11/2022.     COLONOSCOPY W/ POLYPECTOMY  06/2014   Dr Elder at [facility].  3 adenomatoous polyps, anal fissure.     ESOPHAGOGASTRODUODENOSCOPY (EGD) WITH PROPOFOL  N/A 01/27/2020   Procedure: ESOPHAGOGASTRODUODENOSCOPY (EGD) WITH PROPOFOL ;  Surgeon: Shila Gustav GAILS, MD;  Location: [facility] ENDOSCOPY;  Service:  Endoscopy;  Laterality: N/A;   INSERTION OF PERMANENT INTRAPERITONEAL CANNULA/CATH N/A    LAPAROSCOPIC   IR 3D INDEPENDENT WKST  01/06/2020   IR ANGIO INTRA EXTRACRAN SEL INTERNAL CAROTID BILAT MOD SED  01/06/2020   IR ANGIO VERTEBRAL SEL VERTEBRAL UNI L MOD SED  01/06/2020   IR ANGIOGRAM FOLLOW UP STUDY  01/06/2020   IR ANGIOGRAM FOLLOW UP STUDY  01/06/2020   IR ANGIOGRAM FOLLOW UP STUDY  01/06/2020   IR ANGIOGRAM FOLLOW UP STUDY  01/06/2020   IR ANGIOGRAM FOLLOW UP STUDY  01/06/2020   IR ANGIOGRAM FOLLOW UP STUDY  01/06/2020   IR ANGIOGRAM FOLLOW UP STUDY  01/06/2020   IR ANGIOGRAM FOLLOW UP STUDY  01/06/2020   IR ANGIOGRAM FOLLOW UP STUDY  01/06/2020   IR ANGIOGRAM FOLLOW UP STUDY  01/06/2020   IR NEURO EACH ADD'L AFTER BASIC UNI RIGHT (MS)  01/06/2020   IR TRANSCATH/EMBOLIZ  01/06/2020   MASTECTOMY     MOHS SURGERY N/A    RADIOLOGY WITH ANESTHESIA N/A 01/06/2020   Procedure: IR WITH ANESTHESIA FOR ANEURYSM;  Surgeon: Lanis Pupa, MD;  Location: [facility] OR;  Service: Radiology;  Laterality: N/A;   STENT SUPPORTED EMBOLIZATION OF RIGHT ICA ANEURYSM Right    TUBAL LIGATION     VENTRICULO-PERITONEAL SHUNT PLACEMENT / LAPAROSCOPIC INSERTION PERITONEAL CATHETER N/A  Patient Active Problem List   Diagnosis Date Noted   Dyspareunia in female 04/05/2020   History of abnormal cervical Pap smear 03/10/2020   GIB (gastrointestinal bleeding) 02/28/2020   Elevated BUN    Prediabetes    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   Cerebral vasospasm    Sinus tachycardia    Dysphagia, post-stroke    Thrombocytopenia    Acute blood loss anemia    Brain aneurysm    ICH (intracerebral hemorrhage) (HCC) 01/05/2020   History of adenomatous polyp of colon 02/07/2016   Anatomical narrow angle, bilateral 12/14/2014   Fissure in ano 11/11/2014   Hemorrhoids 11/11/2014   Constipation 11/19/2013   GERD (gastroesophageal reflux disease) 03/24/2013   Irritable bowel syndrome with diarrhea  03/24/2013   Herpes zoster 05/14/2005   Abnormal Pap smear of cervix 05/15/1983   History of cervical dysplasia 05/15/1983   ONSET DATE: 12/02/2023  REFERRING DIAG: L ischemic CVA  THERAPY DIAG:  Muscle weakness (generalized)  Other lack of coordination  Rationale for Evaluation and Treatment: Rehabilitation  SUBJECTIVE:  SUBJECTIVE STATEMENT: Pt verbalizes continued desire to improve handwriting to be able to sign greeting cards and make grocery lists.   PERTINENT HISTORY:  Pt. was admitted to [facility] from 12/02/23-12/06/23 with an Acute Right MCA CVA, Hydrocephalus with shunt  placement. History of Subarachnoid Hemorrhage with bilateral ICA coiled aneurysms. 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  PRECAUTIONS: None  WEIGHT BEARING RESTRICTIONS: No  PAIN: 05/13/24: No UE pain, 4/10 in knees Are you having pain? No  FALLS: Has patient fallen in last 6 months? Yes. Number of falls 1  LIVING ENVIRONMENT: Lives with: lives with their family and lives with their spouse Lives in: House/apartment Stairs: hand rails, 2 steps   Has following equipment at home: [name], cane  PLOF: Independent with basic ADLs, Spouse help as needed  PATIENT GOALS: Strengthening  OBJECTIVE:  Note: Objective measures were completed at Evaluation unless otherwise noted.  HAND DOMINANCE: Left  ADLs: Overall ADLs: spouse assists when needed, Pt. reports that she can do most tasks herself. Uses her left hand to engage in self-care tasks. Eating: Able to cut foods with a fork, husband assists as needed.  Grooming: husband assists with applying toothpaste, able to brush teeth and hair UB Dressing: Spouse assists with  applying/fastening bra, Pt. Requires assistance with zippers, and buttons. LB Dressing: Spouse assists with tying shoes when needed, uses slip on shoes. Pt. Does not zip pants. Toileting: Able to complete independently Bathing: Pt. Requires intermittent assistance with bathing tasks. Tub Shower transfers: Spouse assists with transfers when/if needed Equipment: Shower seat with back  IADLs: Shopping: Spouse typically does the shopping, isnt able to carry heavy objects.  Light housekeeping: Spouse typically does most of the cleaning around the home, Pt. Cleans bathroom, and laundry Pt. Is unable to carry laundry due to exacerbation of weakness.  Meal Prep: Spouse typically cooks and prepares meals.  Community mobility: Pt. Requires assistance using walker. Pt. reports that she is unable to carry heavy items, and typically does not do the shopping.  Medication management: Pt. is able to take care of meds with pillbox Financial management:  Spouse takes care of it, and makes online payments.  Handwriting: 50% legible  MOBILITY STATUS: Needs  Assist: Pt. Requires use of a walker   POSTURE COMMENTS:  No Significant postural limitations Sitting balance: Good  ACTIVITY TOLERANCE: Activity tolerance:   FUNCTIONAL OUTCOME MEASURES: TBD  UPPER EXTREMITY ROM:    Active ROM Right eval Right 01/16/24 Right 03/02/24 Right 04/01/24  Right 05/13/24 Left eval Left 01/16/24 Left 03/02/24 Left 04/01/24 Left 05/13/24  Shoulder flexion 94(121) 100 with scaption (132) 126(146) 100 some scaption (145) 100 some scaption (155) 102(125) 134 with scaption (145) 142(148) 140 (155) 140 (155)  Shoulder abduction 75(106) 74 (98) 93(120) 58(90) 85 (131) 110(126) 140 (170) WNL 165 (180) 165 (180)  Shoulder adduction            Shoulder extension            Shoulder internal rotation            Shoulder external rotation            Elbow flexion 135(155)  150(155)   140(150)  150    Elbow extension 0  0    -10(-9)  -4(0)    Wrist flexion 40(44)  48(60)  50 (65) 42(58)  52(60)  88  Wrist extension 30(34)  48(60)  30 (30 limited by spasticity today) 40(50)  52(56)  55  Wrist ulnar deviation            Wrist radial deviation            Wrist pronation            Wrist supination            (Blank rows = not tested)  UPPER EXTREMITY MMT:     MMT Right eval Right 03/02/24 Right 04/01/24 Right 05/13/24 Left eval Left 03/02/24 Left 04/01/24 Left 05/13/24  Shoulder flexion 3-/5 3+/5 3- 3- 3-/5 4/5 4/5 4+/5  Shoulder abduction 3-/5 3-/5 3- 3- 3-/5 4/5 4/5 4+/5  Shoulder adduction          Shoulder extension          Shoulder internal rotation          Shoulder external rotation          Middle trapezius          Lower trapezius          Elbow flexion 4-/5 4+/5 4+ 4+ 4+/5 5/5 5/5 5/5  Elbow extension 4-/5 4+/5 4/5 4/5 4+/5 5/5 5/5 5/5  Wrist flexion 4-/5 4-/5 4/5 4/5 (within ROM limits) 3+/5 4/5 4+/5 4+/5  Wrist extension 4-/5 4-/5 4- 4-/5 (within ROM limits) 3+/5 4/5 4+/5 5/5  Wrist ulnar deviation          Wrist radial deviation          Wrist pronation          Wrist supination          (Blank rows = not tested)  HAND FUNCTION Eval:  Grip strength: Right: N/T lbs; Left: 43 lbs, Lateral pinch: Right: N/T lbs, Left: 9 lbs, and 3 point pinch: Right: N/T lbs, Left: 5 lbs Pt. Presents with flexor tightness in the right 5th digit, however is able to passively extend 5th digit within normal range. Pt. Also Presents with hyper extension in the 2nd and 3rd digit at PIP joints. Pt. Is able to achieve full Digit MP, PIP, and DIP PROM.  01/16/24: Grip strength: Left: 33 lbs; Lateral pinching: Left: 8 lbs; 3 point pinch: Left: 3 lbs  03/02/24: Grip strength: Left: 36 lbs; Lateral  pinching: Left: 8 lbs; 3 point pinch: Left: 3 lbs  04/01/24: Grip strength: Left: (3 trials) 32 lbs, 26 lbs, 34 lbs; Lateral pinch: Pinch gauge unavailable (out for re-calibration)  05/13/24: Grip strength: Left: 30  lbs, 29 lbs, 28 lbs, Lateral pinch: Left: 9 lbs, 3 point pinch: Left: 10 lbs   COORDINATION: 9 Hole Peg test: Right: N/T sec; Left: 51 sec 01/16/24: Left: 1 min 1 sec 03/02/24:  Left: 1 min 1 sec 04/01/24: Left: 58 sec  05/13/24: Left: 43 sec   SENSATION: Not tested  EDEMA:   MUSCLE TONE: Fluctuating flexor tone in R hand.  COGNITION: Overall cognitive status: Within functional limits for tasks assessed  VISION: Subjective report:  Baseline vision:  Visual history:   VISION ASSESSMENT:  PERCEPTION:   PRAXIS:   OBSERVATIONS: Pt. presents with fluctuating flexor tone in the R hand, involving the 5th digit at the PIP/DIP joint. Pt. presents with hyperextension at the 2nd and 3rd digits at the PIP joints.                                                                                                                    TREATMENT DATE: 05/13/24 Therapeutic Activity: -Objective measures taken and goals updated and reviewed for progress note/recertification  Self Care: -Review of OT poc and progress towards goals  PATIENT EDUCATION: Education details: progress towards goals Person educated: Patient Education method: Explanation and Verbal cues Education comprehension: verbal cues required  HOME EXERCISE PROGRAM: -Pink theraputty; visual handout issued -Self and caregiver assisted PROM to R hand and wrist -Monitor splint with skin checks  GOALS: Goals reviewed with patient? Yes  SHORT TERM GOALS: Target date: 04/13/2024   Pt. Will be independent with HEP for UE functioning. Baseline: Eval: No current HEP; 01/16/24: pt reports doing a little writing and squeezing a ball, but denies using putty; spouse stretches RUE daily 02/20/24:  Pt. Continues to do a little writing and squeezing a ball, but denies using putty; spouse stretches RUE 03/02/24: Pt. Husband assists Pt. With ROM to the right hand. Pt. reports doing hand exercises; 03/30/24: Spouse indep with HEP (spouse assists  daily) Goal status: achieved  LONG TERM GOALS: Target date: 06/19/2024  Pt. Will improve [facility] skills to be able to manipulate small objects at home Baseline: 9 hole peg test: L: 51 seconds; 01/16/24: L 1 min 1 sec, 03/02/24: Left: 1 min. & 1 sec. Pt. Continues to work towards using the left to manipulate small objects; 04/01/24: L 58 sec (multiple trials to improve time); 05/13/24: L 43 sec  Goal status: Improved/Ongoing  2.  Pt. Will increase BUE shoulder ROM by 10 degrees to be able to reach into cabinets and shelves.  Baseline: Shoulder flexion: R: 94(121), L: 102(125), Shoulder abduction: R: 75(106), L: 110(126); 01/16/24: Shoulder flexion: R: 100 with scaption (132), L: 134 with scaption (145), Shoulder abduction: R: 74 (98), L: 140 (170)02/20/24: Pt. Is improving with functional reaching through multiple planes with cues, and assist required. 03/02/24: Shoulder  flexion: R: [id number]), L: 142(148), Shoulder abduction: R: 93(120), L: WNL; 03/30/24: ROM to be updated next session; limited by time constraints with poc review, though pt reports she does not reach into cabinets; 04/01/24: Shoulder flexion: R: 100 (145), L: 140 (155); Shoulder abd: R: 50 (90), L: 165 (180); pt reports she does not reach into cabinets; 05/13/24: Shoulder flexion: R: 100 (155), L: 140 (155); Shoulder abd: R: 85 (131), L: 165 (180) Goal status: Ongoing  3.  Pt. Will improve L grip strength by 5# to be able to securely grasp ADL items at home.  Baseline: Grip strength: R: NT, L: 43#; 01/16/24: L: 33#  03/02/24: 36#; 04/01/24: L: 34 lbs (best of 3 trials); 05/13/24: L 30 lbs (best of 3 trials) Goal status: Ongoing  4.  Pt. Will increase L lateral key pinch strength by 3#  to open wide mouth jars.  Baseline: Lateral key pinch: R: NT, L: 9#; 01/16/24: L: 8# 03/02/24: Pinch meter is out for calibration. Pt. Continues to have difficulty opening jars; 04/01/24: NT (pinch gauge not available); continued difficulty opening jars; 05/13/24:  L: 9 lbs Goal status: Ongoing  5.  Pt. Will independently engage the right hand as a gross assist to the left hand 100% of the time during ADLs/IADLs. Baseline: Eval: Pt. With limited engagement of the right hand as a gross assist during tasks; 01/16/24: Observed <25% of the time during OT sessions 02/20/24: Pt. Continues to present with limited engagement of the RUE as a gross assist to the left hand approximately 25% of the time with cues. 03/02/24: Pt. Is starting to engage her right hand more as a gross assist to the left; 04/01/24: Pt engages the R hand as a fair assist to the L with intermittent vc from OT during tx sessions; pt admits she needs to use the R hand more at home; 05/13/24: Intermittent vc needed  Goal status: Ongoing  6.  Pt. Will be able to manipulate zippers, and buttons efficiently with modified independence Baseline: Eval:Right: NT Left: 51 sec; 01/16/24: Left: 1 min and 1 sec 02/20/24: Pt. continues to have difficulty manipulating zippers and buttons.03/02/24: Pt. continues to have difficulty manipulating zippers, and buttons, and may benefit from reviewing adaptive devices; 04/01/24: Pt reports that she continues to avoid clothes with fasteners; 05/13/24: Pt still wears slip on clothing without clothing fasteners, but does report improvement in manipulation of zipper and snaps on purse and wallet.  Goal status: Ongoing   7. Pt. Will improve handwriting legibility to 100% for one sentence in printed form for written correspondence.  Baseline: 50% legible in printed form for first name only; 01/16/24: 80-90% legible to print first name 1 of 4 attempts.  3 of 4 attempts 50% or less legible 02/20/24: first name only 100%, 25% for lists of words. 03/02/24:  first name only 100%, 25% for lists of words; 04/01/24: 100% legibility for a short 3 word sentence using L hand.  Goal status: achieved  8. Pt. Will independently, and efficiently transfer small 1/2 objects from the left hand to  the right hand in midline in preparation from reaching up to place items at a target with 100% accuracy with the right hand. Baseline: 03/02/24: Pt. Has progressed to transferring 1.5 objects between her hand in midline, and reach up to place them at targets with 60% accuracy; 03/30/24: Pt can transfer jumbo pegs and clothespins (~1/5) from R to L hand with extra time, repeat trials, and occasional dropping. Pt continues to  reach toward a target with ~60% accuracy; 05/13/24: goal deferred d/t focus primarily on L hand strength/coordination.  Pt addresses RUE function with a myofascial release provider.   Goal Status: ongoing  9. Pt. Will increase BUE strength by 2 mm grades to assist with ADLs, and IADLs.  Baseline: 03/02/24: UE strength: Shoulder flexion: R: 3+/5 L: 4/5, abduction: R: 3-/5, L 4/5:  elbow flexion: R:4+/5, L: 5/5, extension: R: 4+/5, L:5/5, wrist flexion: R: 4-/5  L: 4/5 wrist extension: R: 4-/5 L: 4/5; 04/01/24: see chart; no significant changes; 05/13/24: improvement in L shoulder flex/abd and wrist ext since last assessment period  Goal Status: achieved for LUE; d/c RUE goals as pt addresses RUE function with a myofascial release provider  10.  Pt will use L hand to write a grocery list of 10 items with at least 75% legibility.    Baseline: 05/13/24: 25-50% legibility to write 1 word lists using L hand.   Goal status: New  ASSESSMENT: CLINICAL IMPRESSION: Pt seen for 40th visit progress update.  Cert sent today d/t objective measures taken and nearing end of cert.  Cert originally ending on the 1/12, planning to ext 4 weeks beyond that to continue with L hand strengthening, coordination, and handwriting.  Pt would like to be able to write a grocery list, and consistently sign her name to greeting cards.  Pt struggles to write her name legibly within confined spaces, and legibility is 25-50% when making 1 word lists (ie grocery lists).  L hand FMC does show improvement since last  session.  Pt has been practicing moving items in/out of her purse and wallet and managing the zippers and snaps on each, and does verbalize better efficiency with these tasks.  L hand grip and lateral pinch strength remain limited, and mostly show a plateau over time since 01/16/24.  L 3 point pinch has improved from 3 lbs to 10 lbs, likely from increased focus on manipulation of zippers, wallet items, and use of tripod grasp for writing during OT sessions this period.  Pt will continue to benefit from skilled OT to address above noted goals, working towards increased indep with daily tasks and reduced burden of care on caregiver.   PERFORMANCE DEFICITS: in functional skills including ADLs, IADLs, coordination, dexterity, edema, tone, ROM, strength, pain, Fine motor control, Gross motor control, endurance, and UE functional use, and psychosocial skills including coping strategies, environmental adaptation, habits, and routines and behaviors.   IMPAIRMENTS: are limiting patient from ADLs, IADLs, rest and sleep, leisure, and social participation.   CO-MORBIDITIES: may have co-morbidities  that affects occupational performance. Patient will benefit from skilled OT to address above impairments and improve overall function.  MODIFICATION OR ASSISTANCE TO COMPLETE EVALUATION: Min-Moderate modification of tasks or assist with assess necessary to complete an evaluation.  OT OCCUPATIONAL PROFILE AND HISTORY: Detailed assessment: Review of records and additional review of physical, cognitive, psychosocial history related to current functional performance.  CLINICAL DECISION MAKING: Moderate - several treatment options, min-mod task modification necessary  REHAB POTENTIAL: Good  EVALUATION COMPLEXITY: Moderate  PLAN:  OT FREQUENCY: 2x/week  OT DURATION: other: 5 weeks  PLANNED INTERVENTIONS: 97168 OT Re-evaluation, 97535 self care/ADL training, 02889 therapeutic exercise, 97530 therapeutic activity, 97112  neuromuscular re-education, 97140 manual therapy, 97018 paraffin, 02960 fluidotherapy, 97010 moist heat, 97010 cryotherapy, 97034 contrast bath, 97032 electrical stimulation (manual), 97760 Orthotic Initial, passive range of motion, energy conservation, coping strategies training, patient/family education, and DME and/or AE instructions  RECOMMENDED OTHER SERVICES:  PT   CONSULTED AND AGREED WITH PLAN OF CARE: Patient  PLAN FOR NEXT SESSION: see above  Inocente Blazing, MS, OTR/L     "

## 2024-05-18 ENCOUNTER — Ambulatory Visit

## 2024-05-18 ENCOUNTER — Ambulatory Visit: Attending: Family Medicine | Admitting: Physical Therapy

## 2024-05-18 ENCOUNTER — Encounter: Admitting: Speech Pathology

## 2024-05-18 DIAGNOSIS — R278 Other lack of coordination: Secondary | ICD-10-CM | POA: Diagnosis present

## 2024-05-18 DIAGNOSIS — M6281 Muscle weakness (generalized): Secondary | ICD-10-CM

## 2024-05-18 DIAGNOSIS — R262 Difficulty in walking, not elsewhere classified: Secondary | ICD-10-CM | POA: Insufficient documentation

## 2024-05-18 DIAGNOSIS — R296 Repeated falls: Secondary | ICD-10-CM | POA: Insufficient documentation

## 2024-05-18 DIAGNOSIS — R269 Unspecified abnormalities of gait and mobility: Secondary | ICD-10-CM | POA: Diagnosis present

## 2024-05-18 DIAGNOSIS — R2689 Other abnormalities of gait and mobility: Secondary | ICD-10-CM | POA: Insufficient documentation

## 2024-05-18 NOTE — Therapy (Signed)
 " OUTPATIENT PHYSICAL THERAPY TREATMENT   Patient Name: Erika Stanton MRN: [id number] DOB:June 27, 1955, 69 y.o., female Today's Date: 05/18/2024  PCP: Dr. Oneil Galloway  REFERRING PROVIDER: Dr. Oneil Galloway  END OF SESSION:   PT End of Session - 05/18/24 1521     Visit Number 39    Number of Visits 45    Date for Recertification  05/27/24    Authorization Type [employer]    Progress Note Due on Visit 40    PT Start Time 1530    PT Stop Time 1615    PT Time Calculation (min) 45 min    Equipment Utilized During Treatment Gait belt    Activity Tolerance Patient tolerated treatment well    Behavior During Therapy WFL for tasks assessed/performed               Past Medical History:  Diagnosis Date   Anatomical narrow angle, bilateral    Aneurysm    Asthma    Cerebral aneurysm    Cerebral vasospasm    Cognitive change    Dysphagia, post-stroke    GERD (gastroesophageal reflux disease)    Hemorrhoids    History of cervical dysplasia    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 no recurrent polyps.  suggest repeat surveillance study 11/2022.     COLONOSCOPY W/ POLYPECTOMY  06/2014   Dr Elder at [facility].  3 adenomatoous polyps, anal fissure.     ESOPHAGOGASTRODUODENOSCOPY (EGD) WITH PROPOFOL  N/A 01/27/2020   Procedure: ESOPHAGOGASTRODUODENOSCOPY (EGD) WITH PROPOFOL ;  Surgeon: Shila Gustav GAILS, MD;  Location: [facility] ENDOSCOPY;  Service: Endoscopy;  Laterality: N/A;   INSERTION OF PERMANENT INTRAPERITONEAL CANNULA/CATH N/A    LAPAROSCOPIC   IR 3D  INDEPENDENT WKST  01/06/2020   IR ANGIO INTRA EXTRACRAN SEL INTERNAL CAROTID BILAT MOD SED  01/06/2020   IR ANGIO VERTEBRAL SEL VERTEBRAL UNI L MOD SED  01/06/2020   IR ANGIOGRAM FOLLOW UP STUDY  01/06/2020   IR ANGIOGRAM FOLLOW UP STUDY  01/06/2020   IR ANGIOGRAM FOLLOW UP STUDY  01/06/2020   IR ANGIOGRAM FOLLOW UP STUDY  01/06/2020   IR ANGIOGRAM FOLLOW UP STUDY  01/06/2020   IR ANGIOGRAM FOLLOW UP STUDY  01/06/2020   IR ANGIOGRAM FOLLOW UP STUDY  01/06/2020   IR ANGIOGRAM FOLLOW UP STUDY  01/06/2020   IR ANGIOGRAM FOLLOW UP STUDY  01/06/2020   IR ANGIOGRAM FOLLOW UP STUDY  01/06/2020   IR NEURO EACH ADD'L AFTER BASIC UNI RIGHT (MS)  01/06/2020   IR TRANSCATH/EMBOLIZ  01/06/2020   MASTECTOMY     MOHS SURGERY N/A    RADIOLOGY WITH ANESTHESIA N/A 01/06/2020   Procedure: IR WITH ANESTHESIA FOR ANEURYSM;  Surgeon: Lanis Pupa, MD;  Location: [facility] OR;  Service: Radiology;  Laterality: N/A;   STENT SUPPORTED EMBOLIZATION OF RIGHT ICA ANEURYSM Right    TUBAL LIGATION     VENTRICULO-PERITONEAL SHUNT PLACEMENT / LAPAROSCOPIC INSERTION PERITONEAL CATHETER N/A    Patient Active  Problem List   Diagnosis Date Noted   Dyspareunia in female 04/05/2020   History of abnormal cervical Pap smear 03/10/2020   GIB (gastrointestinal bleeding) 02/28/2020   Elevated BUN    Prediabetes    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   Cerebral vasospasm    Sinus tachycardia    Dysphagia, post-stroke    Thrombocytopenia    Acute blood loss anemia    Brain aneurysm    ICH (intracerebral hemorrhage) (HCC) 01/05/2020   History of adenomatous polyp of colon 02/07/2016   Anatomical narrow angle, bilateral 12/14/2014   Fissure in ano 11/11/2014   Hemorrhoids 11/11/2014   Constipation 11/19/2013   GERD (gastroesophageal reflux disease) 03/24/2013   Irritable bowel syndrome with diarrhea 03/24/2013   Herpes zoster 05/14/2005   Abnormal Pap smear of cervix 05/15/1983   History of cervical dysplasia  05/15/1983    ONSET DATE: 12/02/2023  REFERRING DIAG: I63.9 (ICD-10-CM) - CVA (cerebral vascular accident) (HCC)   THERAPY DIAG:  Other lack of coordination  Difficulty in walking, not elsewhere classified  Abnormality of gait and mobility  Repeated falls  Other abnormalities of gait and mobility  Muscle weakness (generalized)  Rationale for Evaluation and Treatment: Rehabilitation  SUBJECTIVE:                                                                                                                                                                                             SUBJECTIVE STATEMENT:   Pt states she feels okay today, no new updates. No falls.  States that she had a good new Year; nothing special.   PERTINENT HISTORY:   Patient reports she was in hospital from [facility]- Sat (July 21st- 25th) with CVA affecting left side. I was supposed to have a knee replacement surgery in September but that is on hold for now. I was doing my prep for my colonoscopy and fell in the bathroom.  Patient reports having mild left sided weakness compared to her CVA in 2021 with heavy Right sided UE weakness. She reports bad OA affecting both knees as well. Previous CVA in 2021 affecting Right side. Reports going to ED  last Monday due to fall/CVA affecting Left side. Diagnosed with R CVA with Left Sided weakness.    PAIN:  Are you having pain? B pain knees, 3/10  PRECAUTIONS: Fall  WEIGHT BEARING RESTRICTIONS: No  FALLS: Has patient fallen in last 6 months? Yes. Number of falls fell with the stroke  PATIENT GOALS: Walk without any assistive device.  OBJECTIVE:  Note: Objective measures were completed at evaluation unless otherwise noted.  TREATMENT DATE : 05/18/2024  Nustep, rolling hill program, 6 min, levels 2-5, seat 10, arms 10 use of RUE for 2 min x 2  throughout 6 min.   Blocked practice sit<>stand 3 x 6 reps with LUE support to push to standing,   Gait with walking stick to navigate PT gym 234ft x 2 with cues for posture and sequencing of walking stick on first bout.   Gait with RW and R hand splint 35ft +135ft. Education of position of hand splint and proper use of RUE on RW to improve RW direction and reduce tonal activation of the R biceps.    CGA for safety throughout session unless other wise noted as well as mutliple therapeutic rest breaks between bouts due to fatigue and need for hydration.   PATIENT EDUCATION: Education details: PT plan of care; Discussion of symptoms of CVA and to go to ED as soon as possible. Purpose of PT for balance, strength, coordination and endurance.   Limitations of PT given need for knee replacement and need to continue to see progress to justify additional PT.   Benefits of hand splint on RW to allow use of RW following knee replacement.    Person educated: Patient Education method: Explanation Education comprehension: verbalized understanding  HOME EXERCISE PROGRAM: Access Code: [email or web address] URL: [email or web address] Date: 12/17/2023 Prepared by: Sidra Simpers  Program Notes **Be sure to perform all exercises next to a countertop or sturdy piece of furniture in case you become unsteady.  Exercises - Standing Heel Raise with Support  - 1 x daily - 7 x weekly - 2 sets - 15 reps - Standing Toe Raises at Chair  - 1 x daily - 7 x weekly - 2 sets - 15 reps - Mini Squat with Counter Support  - 1 x daily - 7 x weekly - 2 sets - 15 reps - Lunge with Counter Support  - 1 x daily - 7 x weekly - 2 sets - 15 reps - Standing Tandem Balance with Counter Support  - 1 x daily - 7 x weekly - 2 sets - 2 reps - 30 hold - Standing Single Leg Stance with Counter Support  - 1 x daily - 7 x weekly - 2 sets - 2 reps - 30 hold  GOALS: Goals reviewed with patient? Yes  SHORT TERM GOALS: Target date:  01/21/2024  Pt will be independent with HEP in order to improve strength and balance in order to decrease fall risk and improve function at home.  Baseline: EVAL - No current formal HEP in place 03/04/2024- Patient reports independent with all current HEP and riding a stationary bike.  Goal status: MET   LONG TERM GOALS: Target date: 05/27/2024  1.  Patient will complete five times sit to stand test in < 15 seconds indicating an increased LE strength and improved balance. Baseline: EVAL= 25.20 sec with LUE Support 9/2: 23.36 without UE support and CGA-min assist. 20.10 sec with LUE support 03/04/2024- more difficulty raising without UE but performed 18.75 sec with LUE Support 04/06/24: 21.99s with L UE support on chair rail 12/4:16.59 sec with L UE assist   Goal status: IN PROGRESS   2.  Patient will increase Berg Balance score by > 6 points to demonstrate decreased fall risk during functional activities. Baseline: EVAL: To be assessed next visit; 12/12/2023= 42/56 9/2: 44/56  10/16:45/56 12/4: 47 Goal status: PROGRESSING   3.  Patient will reduce timed up and go to <11 seconds to reduce  fall risk and demonstrate improved transfer/gait ability. Baseline: EVAL = 40 sec with RW 9/2: 18.33sec with SPC 10/16:17.47 sec with SPC 04/06/24: 20.17s w/ SPC 12/4:20.86 sec with SPC 16.58 sec with no AD  Goal status: PROGRESSING  4.  Patient will increase 10 meter walk test to >1.11m/s as to improve gait speed for better community ambulation and to reduce fall risk. Baseline: EVAL: 0.65 m/s 9/2: 0.67 m/s with [facility] 9/9: 0.71 m/s without an AD and wearing 2.5 # AW 10/16: .75 m/s with [facility] 04/06/24: 0.51 m/s 12/4: .675 m/s with SPC, .70 m/s no AD  Goal status: PROGRESSING  5. Patient will increase six minute walk test distance by 200 feet for progression to community ambulator and improve gait ability Baseline: EVAL= 482' 9/2: 465 feet using a SPC with CGA required seated rest break at  4:87min   10/16: 542 ft and full 6 minutes  04/06/24: 525 ft using SPC with CGA, 3 rest breaks taken throughout, @ 2:31 min, 3:67min, and 5:75min 12/4:625 ft total mixed SPC and no SPC initial 300 ft no AD only one standing rest break allowed around 400 ft, encouraged pt to not rest during test.  Goal status: PROGRESS    ASSESSMENT:  CLINICAL IMPRESSION:  PT treatment focused on gait training again with various AD to improve tolerance to standing and increase safety with house hold mobility now and following knee replacement, if MD deems pt to be a good candidate for surgery. Pt noted to have sequencing issues with walking stick on this day, but reports that she felt adequate support with use of hand splint on RW. Pt continues to reports  but states that she feels like the weakness is still CVA related, rather than OA related, but knee pain is the greatest limiting factor on increased distance with all ambulation.   Pt will continue to benefit from skilled therapy to address remaining deficits in order to improve overall QoL and return to PLOF.   OBJECTIVE IMPAIRMENTS: Abnormal gait, decreased activity tolerance, decreased balance, decreased coordination, decreased endurance, decreased mobility, difficulty walking, decreased ROM, decreased strength, impaired UE functional use, and pain.   ACTIVITY LIMITATIONS: carrying, lifting, bending, sitting, standing, squatting, sleeping, stairs, and transfers  PARTICIPATION LIMITATIONS: meal prep, cleaning, laundry, driving, shopping, community activity, and yard work  PERSONAL FACTORS: 1-2 comorbidities: OA, previous CVA with significant R sided weakness are also affecting patient's functional outcome.   REHAB POTENTIAL: Good  CLINICAL DECISION MAKING: Stable/uncomplicated  EVALUATION COMPLEXITY: Moderate  PLAN:  PT FREQUENCY: 1-2x/week  PT DURATION: 12 weeks  PLANNED INTERVENTIONS: [id number]- PT Re-evaluation, 97750- Physical Performance Testing,  97110-Therapeutic exercises, 97530- Therapeutic activity, [id number]- Neuromuscular re-education, 97535- Self Care, [id number]- Manual therapy, [id number]- Gait training, [phone]- Orthotic Initial, [phone]- Orthotic/Prosthetic subsequent, [phone]- Canalith repositioning, [phone]- Electrical stimulation (manual), [phone] (1-2 muscles), [phone] (3+ muscles)- Dry Needling, Patient/Family education, Balance training, Stair training, Taping, Joint mobilization, Joint manipulation, Spinal manipulation, Spinal mobilization, Compression bandaging, Vestibular training, DME instructions, Cryotherapy, and Moist heat   PLAN FOR NEXT SESSION:   - continue ambulation distance and activity tolerance - general LE strengthening - dynamic balance training.  - expand HEP   Massie Dollar PT, DPT  Physical Therapist - [place]  [facility]  3:21 PM 05/18/2024     "

## 2024-05-18 NOTE — Therapy (Signed)
 "  Outpatient Occupational Therapy Neuro Treatment Note   Patient Name: Erika Stanton MRN: [id number] DOB:03-21-1956, 69 y.o., female Today's Date: 05/18/2024  PCP: Laurice Anes, MD REFERRING PROVIDER: Laurice Anes, MD   OT End of Session - 05/18/24 1454     Visit Number 41    Number of Visits 50    Date for Recertification  06/19/24    Authorization Time Period Reporting period beginning 05/13/24    Progress Note Due on Visit 50    OT Start Time 1445    OT Stop Time 1530    OT Time Calculation (min) 45 min    Equipment Utilized During Treatment transport chair, walking stick    Activity Tolerance Patient tolerated treatment well    Behavior During Therapy WFL for tasks assessed/performed         Past Medical History:  Diagnosis Date   Anatomical narrow angle, bilateral    Aneurysm    Asthma    Cerebral aneurysm    Cerebral vasospasm    Cognitive change    Dysphagia, post-stroke    GERD (gastroesophageal reflux disease)    Hemorrhoids    History of cervical dysplasia    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 no recurrent polyps.  suggest repeat surveillance study 11/2022.     COLONOSCOPY W/ POLYPECTOMY  06/2014   Dr Elder at [facility].  3 adenomatoous polyps, anal fissure.     ESOPHAGOGASTRODUODENOSCOPY (EGD) WITH PROPOFOL  N/A 01/27/2020   Procedure: ESOPHAGOGASTRODUODENOSCOPY (EGD) WITH PROPOFOL ;  Surgeon: Shila Gustav GAILS, MD;  Location: [facility] ENDOSCOPY;  Service: Endoscopy;  Laterality: N/A;   INSERTION OF PERMANENT INTRAPERITONEAL  CANNULA/CATH N/A    LAPAROSCOPIC   IR 3D INDEPENDENT WKST  01/06/2020   IR ANGIO INTRA EXTRACRAN SEL INTERNAL CAROTID BILAT MOD SED  01/06/2020   IR ANGIO VERTEBRAL SEL VERTEBRAL UNI L MOD SED  01/06/2020   IR ANGIOGRAM FOLLOW UP STUDY  01/06/2020   IR ANGIOGRAM FOLLOW UP STUDY  01/06/2020   IR ANGIOGRAM FOLLOW UP STUDY  01/06/2020   IR ANGIOGRAM FOLLOW UP STUDY  01/06/2020   IR ANGIOGRAM FOLLOW UP STUDY  01/06/2020   IR ANGIOGRAM FOLLOW UP STUDY  01/06/2020   IR ANGIOGRAM FOLLOW UP STUDY  01/06/2020   IR ANGIOGRAM FOLLOW UP STUDY  01/06/2020   IR ANGIOGRAM FOLLOW UP STUDY  01/06/2020   IR ANGIOGRAM FOLLOW UP STUDY  01/06/2020   IR NEURO EACH ADD'L AFTER BASIC UNI RIGHT (MS)  01/06/2020   IR TRANSCATH/EMBOLIZ  01/06/2020   MASTECTOMY     MOHS SURGERY N/A    RADIOLOGY WITH ANESTHESIA N/A 01/06/2020   Procedure: IR WITH ANESTHESIA FOR ANEURYSM;  Surgeon: Lanis Pupa, MD;  Location: [facility] OR;  Service: Radiology;  Laterality: N/A;   STENT SUPPORTED EMBOLIZATION OF RIGHT ICA ANEURYSM Right    TUBAL LIGATION     VENTRICULO-PERITONEAL SHUNT PLACEMENT / LAPAROSCOPIC INSERTION PERITONEAL CATHETER N/A    Patient Active Problem List  Diagnosis Date Noted   Dyspareunia in female 04/05/2020   History of abnormal cervical Pap smear 03/10/2020   GIB (gastrointestinal bleeding) 02/28/2020   Elevated BUN    Prediabetes    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   Cerebral vasospasm    Sinus tachycardia    Dysphagia, post-stroke    Thrombocytopenia    Acute blood loss anemia    Brain aneurysm    ICH (intracerebral hemorrhage) (HCC) 01/05/2020   History of adenomatous polyp of colon 02/07/2016   Anatomical narrow angle, bilateral 12/14/2014   Fissure in ano 11/11/2014   Hemorrhoids 11/11/2014   Constipation 11/19/2013   GERD (gastroesophageal reflux disease) 03/24/2013   Irritable bowel syndrome with diarrhea 03/24/2013   Herpes zoster 05/14/2005   Abnormal Pap smear of cervix  05/15/1983   History of cervical dysplasia 05/15/1983   ONSET DATE: 12/02/2023  REFERRING DIAG: L ischemic CVA  THERAPY DIAG:  Muscle weakness (generalized)  Other lack of coordination  Rationale for Evaluation and Treatment: Rehabilitation  SUBJECTIVE:  SUBJECTIVE STATEMENT: Pt reports feeling like she did a little   PERTINENT HISTORY:  Pt. was admitted to [facility] from 12/02/23-12/06/23 with an Acute Right MCA CVA, Hydrocephalus with shunt  placement. History of Subarachnoid Hemorrhage with bilateral ICA coiled aneurysms. 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  PRECAUTIONS: None  WEIGHT BEARING RESTRICTIONS: No  PAIN: 05/18/24: No UE pain, 4/10 in knees Are you having pain? No  FALLS: Has patient fallen in last 6 months? Yes. Number of falls 1  LIVING ENVIRONMENT: Lives with: lives with their family and lives with their spouse Lives in: House/apartment Stairs: hand rails, 2 steps   Has following equipment at home: [name], cane  PLOF: Independent with basic ADLs, Spouse help as needed  PATIENT GOALS: Strengthening  OBJECTIVE:  Note: Objective measures were completed at Evaluation unless otherwise noted.  HAND DOMINANCE: Left  ADLs: Overall ADLs: spouse assists when needed, Pt. reports that she can do most tasks herself. Uses her left hand to engage in self-care tasks. Eating: Able to cut foods with a fork, husband assists as needed.  Grooming: husband assists with applying toothpaste, able to brush teeth and hair UB Dressing: Spouse assists with applying/fastening bra, Pt. Requires assistance with zippers, and buttons. LB Dressing: Spouse assists with tying shoes when needed, uses slip on shoes.  Pt. Does not zip pants. Toileting: Able to complete independently Bathing: Pt. Requires intermittent assistance with bathing tasks. Tub Shower transfers: Spouse assists with transfers when/if needed Equipment: Shower seat with back  IADLs: Shopping: Spouse typically does the shopping, isnt able to carry heavy objects.  Light housekeeping: Spouse typically does most of the cleaning around the home, Pt. Cleans bathroom, and laundry Pt. Is unable to carry laundry due to exacerbation of weakness.  Meal Prep: Spouse typically cooks and prepares meals.  Community mobility: Pt. Requires assistance using walker. Pt. reports that she is unable to carry heavy items, and typically does not do the shopping.  Medication management: Pt. is able to take care of meds with pillbox Financial management:  Spouse takes care of it, and makes online payments.  Handwriting: 50% legible  MOBILITY STATUS: Needs Assist: Pt. Requires use of a walker   POSTURE COMMENTS:  No Significant postural limitations  Sitting balance: Good  ACTIVITY TOLERANCE: Activity tolerance:   FUNCTIONAL OUTCOME MEASURES: TBD  UPPER EXTREMITY ROM:    Active ROM Right eval Right 01/16/24 Right 03/02/24 Right 04/01/24  Right 05/13/24 Left eval Left 01/16/24 Left 03/02/24 Left 04/01/24 Left 05/13/24  Shoulder flexion 94(121) 100 with scaption (132) 126(146) 100 some scaption (145) 100 some scaption (155) 102(125) 134 with scaption (145) 142(148) 140 (155) 140 (155)  Shoulder abduction 75(106) 74 (98) 93(120) 58(90) 85 (131) 110(126) 140 (170) WNL 165 (180) 165 (180)  Shoulder adduction            Shoulder extension            Shoulder internal rotation            Shoulder external rotation            Elbow flexion 135(155)  150(155)   140(150)  150    Elbow extension 0  0   -10(-9)  -4(0)    Wrist flexion 40(44)  48(60)  50 (65) 42(58)  52(60)  88  Wrist extension 30(34)  48(60)  30 (30 limited by spasticity today) 40(50)   52(56)  55  Wrist ulnar deviation            Wrist radial deviation            Wrist pronation            Wrist supination            (Blank rows = not tested)  UPPER EXTREMITY MMT:     MMT Right eval Right 03/02/24 Right 04/01/24 Right 05/13/24 Left eval Left 03/02/24 Left 04/01/24 Left 05/13/24  Shoulder flexion 3-/5 3+/5 3- 3- 3-/5 4/5 4/5 4+/5  Shoulder abduction 3-/5 3-/5 3- 3- 3-/5 4/5 4/5 4+/5  Shoulder adduction          Shoulder extension          Shoulder internal rotation          Shoulder external rotation          Middle trapezius          Lower trapezius          Elbow flexion 4-/5 4+/5 4+ 4+ 4+/5 5/5 5/5 5/5  Elbow extension 4-/5 4+/5 4/5 4/5 4+/5 5/5 5/5 5/5  Wrist flexion 4-/5 4-/5 4/5 4/5 (within ROM limits) 3+/5 4/5 4+/5 4+/5  Wrist extension 4-/5 4-/5 4- 4-/5 (within ROM limits) 3+/5 4/5 4+/5 5/5  Wrist ulnar deviation          Wrist radial deviation          Wrist pronation          Wrist supination          (Blank rows = not tested)  HAND FUNCTION Eval:  Grip strength: Right: N/T lbs; Left: 43 lbs, Lateral pinch: Right: N/T lbs, Left: 9 lbs, and 3 point pinch: Right: N/T lbs, Left: 5 lbs Pt. Presents with flexor tightness in the right 5th digit, however is able to passively extend 5th digit within normal range. Pt. Also Presents with hyper extension in the 2nd and 3rd digit at PIP joints. Pt. Is able to achieve full Digit MP, PIP, and DIP PROM.  01/16/24: Grip strength: Left: 33 lbs; Lateral pinching: Left: 8 lbs; 3 point pinch: Left: 3 lbs  03/02/24: Grip strength: Left: 36 lbs; Lateral pinching: Left: 8 lbs; 3 point pinch: Left: 3 lbs  04/01/24: Grip strength: Left: (3  trials) 32 lbs, 26 lbs, 34 lbs; Lateral pinch: Pinch gauge unavailable (out for re-calibration)  05/13/24: Grip strength: Left: 30 lbs, 29 lbs, 28 lbs, Lateral pinch: Left: 9 lbs, 3 point pinch: Left: 10 lbs   COORDINATION: 9 Hole Peg test: Right: N/T sec; Left: 51 sec 01/16/24:  Left: 1 min 1 sec 03/02/24:  Left: 1 min 1 sec 04/01/24: Left: 58 sec  05/13/24: Left: 43 sec   SENSATION: Not tested  EDEMA:   MUSCLE TONE: Fluctuating flexor tone in R hand.  COGNITION: Overall cognitive status: Within functional limits for tasks assessed  VISION: Subjective report:  Baseline vision:  Visual history:   VISION ASSESSMENT:  PERCEPTION:   PRAXIS:   OBSERVATIONS: Pt. presents with fluctuating flexor tone in the R hand, involving the 5th digit at the PIP/DIP joint. Pt. presents with hyperextension at the 2nd and 3rd digits at the PIP joints.                                                                                                                    TREATMENT DATE: 05/18/24 Self Care: -Facilitated handwriting skills with L hand; pt practiced printing, signing name, and writing 1 word grocery items for grocery list on lined paper; occasional min A for pen set up in tripod grasp pattern in L hand, and min vc for closing O's, A's, etc to increase legibility.  Therapeutic Exercise: -L grip strengthening: 3x15 reps with hand gripper (2 red bands for resistance)  Therapeutic Activity: -Facilitated L lateral and 3 point pinch strengthening moving clothespins on/off a vertical dowel.  Min vc/visual cues for pinch prehension patterns.  PATIENT EDUCATION: Education details: OT recert poc Person educated: Patient Education method: Explanation and Verbal cues Education comprehension: verbal cues required  HOME EXERCISE PROGRAM: -Pink theraputty; visual handout issued -Self and caregiver assisted PROM to R hand and wrist -Monitor splint with skin checks  GOALS: Goals reviewed with patient? Yes  SHORT TERM GOALS: Target date: 04/13/2024   Pt. Will be independent with HEP for UE functioning. Baseline: Eval: No current HEP; 01/16/24: pt reports doing a little writing and squeezing a ball, but denies using putty; spouse stretches RUE daily 02/20/24:  Pt.  Continues to do a little writing and squeezing a ball, but denies using putty; spouse stretches RUE 03/02/24: Pt. Husband assists Pt. With ROM to the right hand. Pt. reports doing hand exercises; 03/30/24: Spouse indep with HEP (spouse assists daily) Goal status: achieved  LONG TERM GOALS: Target date: 06/19/2024  Pt. Will improve [facility] skills to be able to manipulate small objects at home Baseline: 9 hole peg test: L: 51 seconds; 01/16/24: L 1 min 1 sec, 03/02/24: Left: 1 min. & 1 sec. Pt. Continues to work towards using the left to manipulate small objects; 04/01/24: L 58 sec (multiple trials to improve time); 05/13/24: L 43 sec  Goal status: Improved/Ongoing  2.  Pt. Will increase BUE shoulder ROM by 10 degrees to be able to reach into cabinets and shelves.  Baseline: Shoulder flexion: R: 94(121), L: 102(125), Shoulder abduction: R: 75(106), L: 110(126); 01/16/24: Shoulder flexion: R: 100 with scaption (132), L: 134 with scaption (145), Shoulder abduction: R: 74 (98), L: 140 (170)02/20/24: Pt. Is improving with functional reaching through multiple planes with cues, and assist required. 03/02/24: Shoulder flexion: R: 126(146), L: 142(148), Shoulder abduction: R: 93(120), L: WNL; 03/30/24: ROM to be updated next session; limited by time constraints with poc review, though pt reports she does not reach into cabinets; 04/01/24: Shoulder flexion: R: 100 (145), L: 140 (155); Shoulder abd: R: 50 (90), L: 165 (180); pt reports she does not reach into cabinets; 05/13/24: Shoulder flexion: R: 100 (155), L: 140 (155); Shoulder abd: R: 85 (131), L: 165 (180) Goal status: Ongoing  3.  Pt. Will improve L grip strength by 5# to be able to securely grasp ADL items at home.  Baseline: Grip strength: R: NT, L: 43#; 01/16/24: L: 33#  03/02/24: 36#; 04/01/24: L: 34 lbs (best of 3 trials); 05/13/24: L 30 lbs (best of 3 trials) Goal status: Ongoing  4.  Pt. Will increase L lateral key pinch strength by 3#  to open wide mouth  jars.  Baseline: Lateral key pinch: R: NT, L: 9#; 01/16/24: L: 8# 03/02/24: Pinch meter is out for calibration. Pt. Continues to have difficulty opening jars; 04/01/24: NT (pinch gauge not available); continued difficulty opening jars; 05/13/24: L: 9 lbs Goal status: Ongoing  5.  Pt. Will independently engage the right hand as a gross assist to the left hand 100% of the time during ADLs/IADLs. Baseline: Eval: Pt. With limited engagement of the right hand as a gross assist during tasks; 01/16/24: Observed <25% of the time during OT sessions 02/20/24: Pt. Continues to present with limited engagement of the RUE as a gross assist to the left hand approximately 25% of the time with cues. 03/02/24: Pt. Is starting to engage her right hand more as a gross assist to the left; 04/01/24: Pt engages the R hand as a fair assist to the L with intermittent vc from OT during tx sessions; pt admits she needs to use the R hand more at home; 05/13/24: Intermittent vc needed  Goal status: Ongoing  6.  Pt. Will be able to manipulate zippers, and buttons efficiently with modified independence Baseline: Eval:Right: NT Left: 51 sec; 01/16/24: Left: 1 min and 1 sec 02/20/24: Pt. continues to have difficulty manipulating zippers and buttons.03/02/24: Pt. continues to have difficulty manipulating zippers, and buttons, and may benefit from reviewing adaptive devices; 04/01/24: Pt reports that she continues to avoid clothes with fasteners; 05/13/24: Pt still wears slip on clothing without clothing fasteners, but does report improvement in manipulation of zipper and snaps on purse and wallet.  Goal status: Ongoing   7. Pt. Will improve handwriting legibility to 100% for one sentence in printed form for written correspondence.  Baseline: 50% legible in printed form for first name only; 01/16/24: 80-90% legible to print first name 1 of 4 attempts.  3 of 4 attempts 50% or less legible 02/20/24: first name only 100%, 25% for lists of words.  03/02/24:  first name only 100%, 25% for lists of words; 04/01/24: 100% legibility for a short 3 word sentence using L hand.  Goal status: achieved  8. Pt. Will independently, and efficiently transfer small 1/2 objects from the left hand to the right hand in midline in preparation from reaching up to place items at a target with 100% accuracy with the right hand.  Baseline: 03/02/24: Pt. Has progressed to transferring 1.5 objects between her hand in midline, and reach up to place them at targets with 60% accuracy; 03/30/24: Pt can transfer jumbo pegs and clothespins (~1/5) from R to L hand with extra time, repeat trials, and occasional dropping. Pt continues to reach toward a target with ~60% accuracy; 05/13/24: goal deferred d/t focus primarily on L hand strength/coordination.  Pt addresses RUE function with a myofascial release provider.   Goal Status: ongoing  9. Pt. Will increase BUE strength by 2 mm grades to assist with ADLs, and IADLs.  Baseline: 03/02/24: UE strength: Shoulder flexion: R: 3+/5 L: 4/5, abduction: R: 3-/5, L 4/5:  elbow flexion: R:4+/5, L: 5/5, extension: R: 4+/5, L:5/5, wrist flexion: R: 4-/5  L: 4/5 wrist extension: R: 4-/5 L: 4/5; 04/01/24: see chart; no significant changes; 05/13/24: improvement in L shoulder flex/abd and wrist ext since last assessment period  Goal Status: achieved for LUE; d/c RUE goals as pt addresses RUE function with a myofascial release provider  10.  Pt will use L hand to write a grocery list of 10 items with at least 75% legibility.    Baseline: 05/13/24: 25-50% legibility to write 1 word lists using L hand.   Goal status: New  ASSESSMENT: CLINICAL IMPRESSION: Pt averaged 75% legibility to write 1 word grocery items on lined paper, though demonstrated as low as 30% legibility on a given word, and as high as 95% legibility.  Continued focus on grip and pinch strengthening in the L hand to maximize stability of ADL supplies and utensils in the L  hand.  Pt will continue to benefit from skilled OT to address above noted goals, working towards increased indep with daily tasks and reduced burden of care on caregiver.   PERFORMANCE DEFICITS: in functional skills including ADLs, IADLs, coordination, dexterity, edema, tone, ROM, strength, pain, Fine motor control, Gross motor control, endurance, and UE functional use, and psychosocial skills including coping strategies, environmental adaptation, habits, and routines and behaviors.   IMPAIRMENTS: are limiting patient from ADLs, IADLs, rest and sleep, leisure, and social participation.   CO-MORBIDITIES: may have co-morbidities  that affects occupational performance. Patient will benefit from skilled OT to address above impairments and improve overall function.  MODIFICATION OR ASSISTANCE TO COMPLETE EVALUATION: Min-Moderate modification of tasks or assist with assess necessary to complete an evaluation.  OT OCCUPATIONAL PROFILE AND HISTORY: Detailed assessment: Review of records and additional review of physical, cognitive, psychosocial history related to current functional performance.  CLINICAL DECISION MAKING: Moderate - several treatment options, min-mod task modification necessary  REHAB POTENTIAL: Good  EVALUATION COMPLEXITY: Moderate  PLAN:  OT FREQUENCY: 2x/week  OT DURATION: other: 5 weeks  PLANNED INTERVENTIONS: 97168 OT Re-evaluation, 97535 self care/ADL training, 02889 therapeutic exercise, 97530 therapeutic activity, 97112 neuromuscular re-education, 97140 manual therapy, 97018 paraffin, 02960 fluidotherapy, 97010 moist heat, 97010 cryotherapy, 97034 contrast bath, 97032 electrical stimulation (manual), 97760 Orthotic Initial, passive range of motion, energy conservation, coping strategies training, patient/family education, and DME and/or AE instructions  RECOMMENDED OTHER SERVICES: PT   CONSULTED AND AGREED WITH PLAN OF CARE: Patient  PLAN FOR NEXT SESSION: see  above  Inocente Blazing, MS, OTR/L     "

## 2024-05-20 ENCOUNTER — Ambulatory Visit

## 2024-05-20 ENCOUNTER — Encounter: Admitting: Speech Pathology

## 2024-05-20 ENCOUNTER — Ambulatory Visit: Admitting: Physical Therapy

## 2024-05-20 DIAGNOSIS — R296 Repeated falls: Secondary | ICD-10-CM

## 2024-05-20 DIAGNOSIS — R278 Other lack of coordination: Secondary | ICD-10-CM

## 2024-05-20 DIAGNOSIS — M6281 Muscle weakness (generalized): Secondary | ICD-10-CM

## 2024-05-20 DIAGNOSIS — R262 Difficulty in walking, not elsewhere classified: Secondary | ICD-10-CM

## 2024-05-20 DIAGNOSIS — R269 Unspecified abnormalities of gait and mobility: Secondary | ICD-10-CM

## 2024-05-20 DIAGNOSIS — R2689 Other abnormalities of gait and mobility: Secondary | ICD-10-CM

## 2024-05-20 NOTE — Therapy (Signed)
 "  Outpatient Occupational Therapy Neuro Treatment Note   Patient Name: Erika Stanton MRN: [id number] DOB:Dec 27, 1955, 69 y.o., female Today's Date: 05/24/2024  PCP: Laurice Anes, MD REFERRING PROVIDER: Laurice Anes, MD   OT End of Session - 05/24/24 1459     Visit Number 42    Number of Visits 50    Date for Recertification  06/19/24    Authorization Time Period Reporting period beginning 05/13/24    Progress Note Due on Visit 50    OT Start Time 1400    OT Stop Time 1445    OT Time Calculation (min) 45 min    Equipment Utilized During Treatment transport chair, walking stick    Activity Tolerance Patient tolerated treatment well    Behavior During Therapy WFL for tasks assessed/performed         Past Medical History:  Diagnosis Date   Anatomical narrow angle, bilateral    Aneurysm    Asthma    Cerebral aneurysm    Cerebral vasospasm    Cognitive change    Dysphagia, post-stroke    GERD (gastroesophageal reflux disease)    Hemorrhoids    History of cervical dysplasia    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 no recurrent polyps.  suggest repeat surveillance study 11/2022.     COLONOSCOPY W/ POLYPECTOMY  06/2014   Dr Elder at [facility].  3 adenomatoous polyps, anal fissure.     ESOPHAGOGASTRODUODENOSCOPY (EGD) WITH PROPOFOL  N/A 01/27/2020   Procedure: ESOPHAGOGASTRODUODENOSCOPY (EGD) WITH PROPOFOL ;  Surgeon: Shila Gustav GAILS, MD;  Location: [facility] ENDOSCOPY;  Service: Endoscopy;  Laterality: N/A;   INSERTION OF PERMANENT INTRAPERITONEAL  CANNULA/CATH N/A    LAPAROSCOPIC   IR 3D INDEPENDENT WKST  01/06/2020   IR ANGIO INTRA EXTRACRAN SEL INTERNAL CAROTID BILAT MOD SED  01/06/2020   IR ANGIO VERTEBRAL SEL VERTEBRAL UNI L MOD SED  01/06/2020   IR ANGIOGRAM FOLLOW UP STUDY  01/06/2020   IR ANGIOGRAM FOLLOW UP STUDY  01/06/2020   IR ANGIOGRAM FOLLOW UP STUDY  01/06/2020   IR ANGIOGRAM FOLLOW UP STUDY  01/06/2020   IR ANGIOGRAM FOLLOW UP STUDY  01/06/2020   IR ANGIOGRAM FOLLOW UP STUDY  01/06/2020   IR ANGIOGRAM FOLLOW UP STUDY  01/06/2020   IR ANGIOGRAM FOLLOW UP STUDY  01/06/2020   IR ANGIOGRAM FOLLOW UP STUDY  01/06/2020   IR ANGIOGRAM FOLLOW UP STUDY  01/06/2020   IR NEURO EACH ADD'L AFTER BASIC UNI RIGHT (MS)  01/06/2020   IR TRANSCATH/EMBOLIZ  01/06/2020   MASTECTOMY     MOHS SURGERY N/A    RADIOLOGY WITH ANESTHESIA N/A 01/06/2020   Procedure: IR WITH ANESTHESIA FOR ANEURYSM;  Surgeon: Lanis Pupa, MD;  Location: [facility] OR;  Service: Radiology;  Laterality: N/A;   STENT SUPPORTED EMBOLIZATION OF RIGHT ICA ANEURYSM Right    TUBAL LIGATION     VENTRICULO-PERITONEAL SHUNT PLACEMENT / LAPAROSCOPIC INSERTION PERITONEAL CATHETER N/A    Patient Active Problem List  Diagnosis Date Noted   Dyspareunia in female 04/05/2020   History of abnormal cervical Pap smear 03/10/2020   GIB (gastrointestinal bleeding) 02/28/2020   Elevated BUN    Prediabetes    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   Cerebral vasospasm    Sinus tachycardia    Dysphagia, post-stroke    Thrombocytopenia    Acute blood loss anemia    Brain aneurysm    ICH (intracerebral hemorrhage) (HCC) 01/05/2020   History of adenomatous polyp of colon 02/07/2016   Anatomical narrow angle, bilateral 12/14/2014   Fissure in ano 11/11/2014   Hemorrhoids 11/11/2014   Constipation 11/19/2013   GERD (gastroesophageal reflux disease) 03/24/2013   Irritable bowel syndrome with diarrhea 03/24/2013   Herpes zoster 05/14/2005   Abnormal Pap smear of cervix  05/15/1983   History of cervical dysplasia 05/15/1983   ONSET DATE: 12/02/2023  REFERRING DIAG: L ischemic CVA  THERAPY DIAG:  Muscle weakness (generalized)  Other lack of coordination  Rationale for Evaluation and Treatment: Rehabilitation  SUBJECTIVE:  SUBJECTIVE STATEMENT: Pt reports feeling like she did a little better signing her receipt when checking in for therapy up front today.  PERTINENT HISTORY:  Pt. was admitted to [facility] from 12/02/23-12/06/23 with an Acute Right MCA CVA, Hydrocephalus with shunt  placement. History of Subarachnoid Hemorrhage with bilateral ICA coiled aneurysms. 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  PRECAUTIONS: None  WEIGHT BEARING RESTRICTIONS: No  PAIN: 05/20/24: No UE pain, 4/10 in knees Are you having pain? No  FALLS: Has patient fallen in last 6 months? Yes. Number of falls 1  LIVING ENVIRONMENT: Lives with: lives with their family and lives with their spouse Lives in: House/apartment Stairs: hand rails, 2 steps   Has following equipment at home: [name], cane  PLOF: Independent with basic ADLs, Spouse help as needed  PATIENT GOALS: Strengthening  OBJECTIVE:  Note: Objective measures were completed at Evaluation unless otherwise noted.  HAND DOMINANCE: Left  ADLs: Overall ADLs: spouse assists when needed, Pt. reports that she can do most tasks herself. Uses her left hand to engage in self-care tasks. Eating: Able to cut foods with a fork, husband assists as needed.  Grooming: husband assists with applying toothpaste, able to brush teeth and hair UB Dressing: Spouse assists with applying/fastening bra, Pt. Requires assistance with zippers, and buttons. LB  Dressing: Spouse assists with tying shoes when needed, uses slip on shoes. Pt. Does not zip pants. Toileting: Able to complete independently Bathing: Pt. Requires intermittent assistance with bathing tasks. Tub Shower transfers: Spouse assists with transfers when/if needed Equipment: Shower seat with back  IADLs: Shopping: Spouse typically does the shopping, isnt able to carry heavy objects.  Light housekeeping: Spouse typically does most of the cleaning around the home, Pt. Cleans bathroom, and laundry Pt. Is unable to carry laundry due to exacerbation of weakness.  Meal Prep: Spouse typically cooks and prepares meals.  Community mobility: Pt. Requires assistance using walker. Pt. reports that she is unable to carry heavy items, and typically does not do the shopping.  Medication management: Pt. is able to take care of meds with pillbox Financial management:  Spouse takes care of it, and makes online payments.  Handwriting: 50% legible  MOBILITY STATUS: Needs Assist: Pt. Requires use of  a walker   POSTURE COMMENTS:  No Significant postural limitations Sitting balance: Good  ACTIVITY TOLERANCE: Activity tolerance:   FUNCTIONAL OUTCOME MEASURES: TBD  UPPER EXTREMITY ROM:    Active ROM Right eval Right 01/16/24 Right 03/02/24 Right 04/01/24  Right 05/13/24 Left eval Left 01/16/24 Left 03/02/24 Left 04/01/24 Left 05/13/24  Shoulder flexion 94(121) 100 with scaption (132) 126(146) 100 some scaption (145) 100 some scaption (155) 102(125) 134 with scaption (145) 142(148) 140 (155) 140 (155)  Shoulder abduction 75(106) 74 (98) 93(120) 58(90) 85 (131) 110(126) 140 (170) WNL 165 (180) 165 (180)  Shoulder adduction            Shoulder extension            Shoulder internal rotation            Shoulder external rotation            Elbow flexion 135(155)  150(155)   140(150)  150    Elbow extension 0  0   -10(-9)  -4(0)    Wrist flexion 40(44)  48(60)  50 (65) 42(58)  52(60)  88   Wrist extension 30(34)  48(60)  30 (30 limited by spasticity today) 40(50)  52(56)  55  Wrist ulnar deviation            Wrist radial deviation            Wrist pronation            Wrist supination            (Blank rows = not tested)  UPPER EXTREMITY MMT:     MMT Right eval Right 03/02/24 Right 04/01/24 Right 05/13/24 Left eval Left 03/02/24 Left 04/01/24 Left 05/13/24  Shoulder flexion 3-/5 3+/5 3- 3- 3-/5 4/5 4/5 4+/5  Shoulder abduction 3-/5 3-/5 3- 3- 3-/5 4/5 4/5 4+/5  Shoulder adduction          Shoulder extension          Shoulder internal rotation          Shoulder external rotation          Middle trapezius          Lower trapezius          Elbow flexion 4-/5 4+/5 4+ 4+ 4+/5 5/5 5/5 5/5  Elbow extension 4-/5 4+/5 4/5 4/5 4+/5 5/5 5/5 5/5  Wrist flexion 4-/5 4-/5 4/5 4/5 (within ROM limits) 3+/5 4/5 4+/5 4+/5  Wrist extension 4-/5 4-/5 4- 4-/5 (within ROM limits) 3+/5 4/5 4+/5 5/5  Wrist ulnar deviation          Wrist radial deviation          Wrist pronation          Wrist supination          (Blank rows = not tested)  HAND FUNCTION Eval:  Grip strength: Right: N/T lbs; Left: 43 lbs, Lateral pinch: Right: N/T lbs, Left: 9 lbs, and 3 point pinch: Right: N/T lbs, Left: 5 lbs Pt. Presents with flexor tightness in the right 5th digit, however is able to passively extend 5th digit within normal range. Pt. Also Presents with hyper extension in the 2nd and 3rd digit at PIP joints. Pt. Is able to achieve full Digit MP, PIP, and DIP PROM.  01/16/24: Grip strength: Left: 33 lbs; Lateral pinching: Left: 8 lbs; 3 point pinch: Left: 3 lbs  03/02/24: Grip strength: Left: 36 lbs; Lateral pinching: Left: 8 lbs; 3  point pinch: Left: 3 lbs  04/01/24: Grip strength: Left: (3 trials) 32 lbs, 26 lbs, 34 lbs; Lateral pinch: Pinch gauge unavailable (out for re-calibration)  05/13/24: Grip strength: Left: 30 lbs, 29 lbs, 28 lbs, Lateral pinch: Left: 9 lbs, 3 point pinch: Left: 10  lbs   COORDINATION: 9 Hole Peg test: Right: N/T sec; Left: 51 sec 01/16/24: Left: 1 min 1 sec 03/02/24:  Left: 1 min 1 sec 04/01/24: Left: 58 sec  05/13/24: Left: 43 sec   SENSATION: Not tested  EDEMA:   MUSCLE TONE: Fluctuating flexor tone in R hand.  COGNITION: Overall cognitive status: Within functional limits for tasks assessed  VISION: Subjective report:  Baseline vision:  Visual history:   VISION ASSESSMENT:  PERCEPTION:   PRAXIS:   OBSERVATIONS: Pt. presents with fluctuating flexor tone in the R hand, involving the 5th digit at the PIP/DIP joint. Pt. presents with hyperextension at the 2nd and 3rd digits at the PIP joints.                                                                                                                    TREATMENT DATE: 05/20/24 Self Care: -Facilitated handwriting skills with L hand; pt practiced printing, signing name, and simulating a greeting card by drawing a heart and signing pt's name within a designated space at the bottom of a small piece of paper. Min vc for L paper positioning and use of dycem to stabilize paper on table.   Therapeutic Exercise: -L grip strengthening: 5x15 reps with hand gripper (2 red bands for resistance); set up assist and vc for maximizing end range digit flexion for complete closed fist   Therapeutic Activity: -Facilitated L lateral and 3 point pinch strengthening moving clothespins on/off a vertical dowel.  Min vc/visual cues for pinch prehension patterns.  Able to tolerate yellow, red, and green pins; successful with 1 or 2 blue clips with lateral pinch only.    PATIENT EDUCATION: Education details: L hand strengthening  Person educated: Patient Education method: Verbal cues Education comprehension: verbal cues required  HOME EXERCISE PROGRAM: -Pink theraputty; visual handout issued -Self and caregiver assisted PROM to R hand and wrist -Monitor splint with skin checks  GOALS: Goals reviewed with  patient? Yes  SHORT TERM GOALS: Target date: 04/13/2024   Pt. Will be independent with HEP for UE functioning. Baseline: Eval: No current HEP; 01/16/24: pt reports doing a little writing and squeezing a ball, but denies using putty; spouse stretches RUE daily 02/20/24:  Pt. Continues to do a little writing and squeezing a ball, but denies using putty; spouse stretches RUE 03/02/24: Pt. Husband assists Pt. With ROM to the right hand. Pt. reports doing hand exercises; 03/30/24: Spouse indep with HEP (spouse assists daily) Goal status: achieved  LONG TERM GOALS: Target date: 06/19/2024  Pt. Will improve [facility] skills to be able to manipulate small objects at home Baseline: 9 hole peg test: L: 51 seconds; 01/16/24: L 1 min 1 sec, 03/02/24: Left: 1 min. &  1 sec. Pt. Continues to work towards using the left to manipulate small objects; 04/01/24: L 58 sec (multiple trials to improve time); 05/13/24: L 43 sec  Goal status: Improved/Ongoing  2.  Pt. Will increase BUE shoulder ROM by 10 degrees to be able to reach into cabinets and shelves.  Baseline: Shoulder flexion: R: 94(121), L: 102(125), Shoulder abduction: R: 75(106), L: 110(126); 01/16/24: Shoulder flexion: R: 100 with scaption (132), L: 134 with scaption (145), Shoulder abduction: R: 74 (98), L: 140 (170)02/20/24: Pt. Is improving with functional reaching through multiple planes with cues, and assist required. 03/02/24: Shoulder flexion: R: 126(146), L: 142(148), Shoulder abduction: R: 93(120), L: WNL; 03/30/24: ROM to be updated next session; limited by time constraints with poc review, though pt reports she does not reach into cabinets; 04/01/24: Shoulder flexion: R: 100 (145), L: 140 (155); Shoulder abd: R: 50 (90), L: 165 (180); pt reports she does not reach into cabinets; 05/13/24: Shoulder flexion: R: 100 (155), L: 140 (155); Shoulder abd: R: 85 (131), L: 165 (180) Goal status: Ongoing  3.  Pt. Will improve L grip strength by 5# to be able to securely  grasp ADL items at home.  Baseline: Grip strength: R: NT, L: 43#; 01/16/24: L: 33#  03/02/24: 36#; 04/01/24: L: 34 lbs (best of 3 trials); 05/13/24: L 30 lbs (best of 3 trials) Goal status: Ongoing  4.  Pt. Will increase L lateral key pinch strength by 3#  to open wide mouth jars.  Baseline: Lateral key pinch: R: NT, L: 9#; 01/16/24: L: 8# 03/02/24: Pinch meter is out for calibration. Pt. Continues to have difficulty opening jars; 04/01/24: NT (pinch gauge not available); continued difficulty opening jars; 05/13/24: L: 9 lbs Goal status: Ongoing  5.  Pt. Will independently engage the right hand as a gross assist to the left hand 100% of the time during ADLs/IADLs. Baseline: Eval: Pt. With limited engagement of the right hand as a gross assist during tasks; 01/16/24: Observed <25% of the time during OT sessions 02/20/24: Pt. Continues to present with limited engagement of the RUE as a gross assist to the left hand approximately 25% of the time with cues. 03/02/24: Pt. Is starting to engage her right hand more as a gross assist to the left; 04/01/24: Pt engages the R hand as a fair assist to the L with intermittent vc from OT during tx sessions; pt admits she needs to use the R hand more at home; 05/13/24: Intermittent vc needed  Goal status: Ongoing  6.  Pt. Will be able to manipulate zippers, and buttons efficiently with modified independence Baseline: Eval:Right: NT Left: 51 sec; 01/16/24: Left: 1 min and 1 sec 02/20/24: Pt. continues to have difficulty manipulating zippers and buttons.03/02/24: Pt. continues to have difficulty manipulating zippers, and buttons, and may benefit from reviewing adaptive devices; 04/01/24: Pt reports that she continues to avoid clothes with fasteners; 05/13/24: Pt still wears slip on clothing without clothing fasteners, but does report improvement in manipulation of zipper and snaps on purse and wallet.  Goal status: Ongoing   7. Pt. Will improve handwriting legibility to 100%  for one sentence in printed form for written correspondence.  Baseline: 50% legible in printed form for first name only; 01/16/24: 80-90% legible to print first name 1 of 4 attempts.  3 of 4 attempts 50% or less legible 02/20/24: first name only 100%, 25% for lists of words. 03/02/24:  first name only 100%, 25% for lists of words; 04/01/24: 100%  legibility for a short 3 word sentence using L hand.  Goal status: achieved  8. Pt. Will independently, and efficiently transfer small 1/2 objects from the left hand to the right hand in midline in preparation from reaching up to place items at a target with 100% accuracy with the right hand. Baseline: 03/02/24: Pt. Has progressed to transferring 1.5 objects between her hand in midline, and reach up to place them at targets with 60% accuracy; 03/30/24: Pt can transfer jumbo pegs and clothespins (~1/5) from R to L hand with extra time, repeat trials, and occasional dropping. Pt continues to reach toward a target with ~60% accuracy; 05/13/24: goal deferred d/t focus primarily on L hand strength/coordination.  Pt addresses RUE function with a myofascial release provider.   Goal Status: ongoing  9. Pt. Will increase BUE strength by 2 mm grades to assist with ADLs, and IADLs.  Baseline: 03/02/24: UE strength: Shoulder flexion: R: 3+/5 L: 4/5, abduction: R: 3-/5, L 4/5:  elbow flexion: R:4+/5, L: 5/5, extension: R: 4+/5, L:5/5, wrist flexion: R: 4-/5  L: 4/5 wrist extension: R: 4-/5 L: 4/5; 04/01/24: see chart; no significant changes; 05/13/24: improvement in L shoulder flex/abd and wrist ext since last assessment period  Goal Status: achieved for LUE; d/c RUE goals as pt addresses RUE function with a myofascial release provider  10.  Pt will use L hand to write a grocery list of 10 items with at least 75% legibility.    Baseline: 05/13/24: 25-50% legibility to write 1 word lists using L hand.   Goal status: New  ASSESSMENT: CLINICAL IMPRESSION: Pt made a good  attempts to stabilize paper with R hand on table top, but ultimately required dycem beneath paper to prevent sliding while writing with L hand.  Pt required occasional tactile cues for use of tripod grasp around pen for better pend stability.  Min vc for picking up pen between letters to improve legibility.  Pt tolerated 5 sets on hand gripper today, and 1-2 blue clips using a lateral pinch on the L hand.  Continued focus on grip and pinch strengthening in the L hand to maximize stability of ADL supplies and utensils in the L hand.  Pt will continue to benefit from skilled OT to address above noted goals, working towards increased indep with daily tasks and reduced burden of care on caregiver.   PERFORMANCE DEFICITS: in functional skills including ADLs, IADLs, coordination, dexterity, edema, tone, ROM, strength, pain, Fine motor control, Gross motor control, endurance, and UE functional use, and psychosocial skills including coping strategies, environmental adaptation, habits, and routines and behaviors.   IMPAIRMENTS: are limiting patient from ADLs, IADLs, rest and sleep, leisure, and social participation.   CO-MORBIDITIES: may have co-morbidities  that affects occupational performance. Patient will benefit from skilled OT to address above impairments and improve overall function.  MODIFICATION OR ASSISTANCE TO COMPLETE EVALUATION: Min-Moderate modification of tasks or assist with assess necessary to complete an evaluation.  OT OCCUPATIONAL PROFILE AND HISTORY: Detailed assessment: Review of records and additional review of physical, cognitive, psychosocial history related to current functional performance.  CLINICAL DECISION MAKING: Moderate - several treatment options, min-mod task modification necessary  REHAB POTENTIAL: Good  EVALUATION COMPLEXITY: Moderate  PLAN:  OT FREQUENCY: 2x/week  OT DURATION: other: 5 weeks  PLANNED INTERVENTIONS: 97168 OT Re-evaluation, 97535 self care/ADL  training, 02889 therapeutic exercise, 97530 therapeutic activity, 97112 neuromuscular re-education, 97140 manual therapy, 97018 paraffin, 02960 fluidotherapy, 97010 moist heat, 97010 cryotherapy, 97034 contrast bath, [id number]  electrical stimulation (manual), 02239 Orthotic Initial, passive range of motion, energy conservation, coping strategies training, patient/family education, and DME and/or AE instructions  RECOMMENDED OTHER SERVICES: PT   CONSULTED AND AGREED WITH PLAN OF CARE: Patient  PLAN FOR NEXT SESSION: see above  Inocente Blazing, MS, OTR/L     "

## 2024-05-20 NOTE — Therapy (Signed)
 " OUTPATIENT PHYSICAL THERAPY TREATMENT /  PHYSICAL THERAPY PROGRESS NOTE   Dates of reporting period  04/19/24   to   05/20/2024    Patient Name: Erika Stanton MRN: [id number] DOB:07-Jan-1956, 69 y.o., female Today's Date: 05/20/2024  PCP: Dr. Oneil Galloway  REFERRING PROVIDER: Dr. Oneil Galloway  END OF SESSION:   PT End of Session - 05/20/24 1456     Visit Number 40    Number of Visits 45    Date for Recertification  05/27/24    Authorization Type [employer]    Progress Note Due on Visit 40    PT Start Time 1449    PT Stop Time 1530    PT Time Calculation (min) 41 min    Equipment Utilized During Treatment Gait belt    Activity Tolerance Patient tolerated treatment well    Behavior During Therapy WFL for tasks assessed/performed                Past Medical History:  Diagnosis Date   Anatomical narrow angle, bilateral    Aneurysm    Asthma    Cerebral aneurysm    Cerebral vasospasm    Cognitive change    Dysphagia, post-stroke    GERD (gastroesophageal reflux disease)    Hemorrhoids    History of cervical dysplasia    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 no recurrent polyps.  suggest repeat surveillance study 11/2022.     COLONOSCOPY W/ POLYPECTOMY  06/2014   Dr Elder at [facility].  3 adenomatoous polyps, anal fissure.     ESOPHAGOGASTRODUODENOSCOPY (EGD) WITH PROPOFOL  N/A 01/27/2020   Procedure: ESOPHAGOGASTRODUODENOSCOPY (EGD) WITH PROPOFOL ;  Surgeon: Shila Gustav GAILS, MD;  Location: [facility] END[facility]COPY;  Service: Endoscopy;   Laterality: N/A;   INSERTION OF PERMANENT INTRAPERITONEAL CANNULA/CATH N/A    LAPAROSCOPIC   IR 3D INDEPENDENT WKST  01/06/2020   IR ANGIO INTRA EXTRACRAN SEL INTERNAL CAROTID BILAT MOD SED  01/06/2020   IR ANGIO VERTEBRAL SEL VERTEBRAL UNI L MOD SED  01/06/2020   IR ANGIOGRAM FOLLOW UP STUDY  01/06/2020   IR ANGIOGRAM FOLLOW UP STUDY  01/06/2020   IR ANGIOGRAM FOLLOW UP STUDY  01/06/2020   IR ANGIOGRAM FOLLOW UP STUDY  01/06/2020   IR ANGIOGRAM FOLLOW UP STUDY  01/06/2020   IR ANGIOGRAM FOLLOW UP STUDY  01/06/2020   IR ANGIOGRAM FOLLOW UP STUDY  01/06/2020   IR ANGIOGRAM FOLLOW UP STUDY  01/06/2020   IR ANGIOGRAM FOLLOW UP STUDY  01/06/2020   IR ANGIOGRAM FOLLOW UP STUDY  01/06/2020   IR NEURO EACH ADD'L AFTER BASIC UNI RIGHT (MS)  01/06/2020   IR TRANSCATH/EMBOLIZ  01/06/2020   MASTECTOMY     MOHS SURGERY N/A    RADIOLOGY WITH ANESTHESIA N/A 01/06/2020   Procedure: IR WITH ANESTHESIA FOR ANEURYSM;  Surgeon: Lanis Pupa, MD;  Location: [facility] OR;  Service: Radiology;  Laterality: N/A;   STENT SUPPORTED EMBOLIZATION OF RIGHT ICA ANEURYSM Right  TUBAL LIGATION     VENTRICULO-PERITONEAL SHUNT PLACEMENT / LAPAROSCOPIC INSERTION PERITONEAL CATHETER N/A    Patient Active Problem List   Diagnosis Date Noted   Dyspareunia in female 04/05/2020   History of abnormal cervical Pap smear 03/10/2020   GIB (gastrointestinal bleeding) 02/28/2020   Elevated BUN    Prediabetes    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   Cerebral vasospasm    Sinus tachycardia    Dysphagia, post-stroke    Thrombocytopenia    Acute blood loss anemia    Brain aneurysm    ICH (intracerebral hemorrhage) (HCC) 01/05/2020   History of adenomatous polyp of colon 02/07/2016   Anatomical narrow angle, bilateral 12/14/2014   Fissure in ano 11/11/2014   Hemorrhoids 11/11/2014   Constipation 11/19/2013   GERD (gastroesophageal reflux disease) 03/24/2013   Irritable bowel syndrome with diarrhea 03/24/2013    Herpes zoster 05/14/2005   Abnormal Pap smear of cervix 05/15/1983   History of cervical dysplasia 05/15/1983    ONSET DATE: 12/02/2023  REFERRING DIAG: I63.9 (ICD-10-CM) - CVA (cerebral vascular accident) (HCC)   THERAPY DIAG:  Muscle weakness (generalized)  Difficulty in walking, not elsewhere classified  Repeated falls  Abnormality of gait and mobility  Other lack of coordination  Other abnormalities of gait and mobility  Rationale for Evaluation and Treatment: Rehabilitation  SUBJECTIVE:                                                                                                                                                                                             SUBJECTIVE STATEMENT:   Pt states she feels okay today, no new updates. Hopeful that she will be improved from prior assessment.   PERTINENT HISTORY:   Patient reports she was in hospital from [facility]- Sat (July 21st- 25th) with CVA affecting left side. I was supposed to have a knee replacement surgery in September but that is on hold for now. I was doing my prep for my colonoscopy and fell in the bathroom.  Patient reports having mild left sided weakness compared to her CVA in 2021 with heavy Right sided UE weakness. She reports bad OA affecting both knees as well. Previous CVA in 2021 affecting Right side. Reports going to ED  last Monday due to fall/CVA affecting Left side. Diagnosed with R CVA with Left Sided weakness.    PAIN:  Are you having pain? B pain knees, 3/10  PRECAUTIONS: Fall  WEIGHT BEARING RESTRICTIONS: No  FALLS: Has patient fallen in last 6 months? Yes. Number of falls fell with the stroke  PATIENT GOALS: Walk without any assistive device.  OBJECTIVE:  Note: Objective measures were completed at evaluation unless otherwise noted.                                                                                                                              TREATMENT DATE :  05/20/2024  Nustep, rolling hill program, 4 min, levels 1-3, seat 10, arms 10 use BLE and LUE throughout. R hand splint in place   PT instructed pt in outcome measure assessment for progress note and measure of current functional status.   Pt performed 5 time sit<>stand (5xSTS): 16.6 sec (16.40, 16.8>15 sec indicates increased fall risk)   PT instructed pt in TUG: 19.69 with walking stick 16.67 sec without AD  sec (average of 3 trials; >13.5 sec indicates increased fall risk)  Patient demonstrates increased fall risk as noted by score of   46/56 on Berg Balance Scale.  (<36= high risk for falls, close to 100%; 37-45 significant >80%; 46-51 moderate >50%; 52-55 lower >25%)   [facility] PT Assessment - 05/20/24 0001       Berg Balance Test   Sit to Stand Able to stand  independently using hands    Standing Unsupported Able to stand safely 2 minutes    Sitting with Back Unsupported but Feet Supported on Floor or Stool Able to sit safely and securely 2 minutes    Stand to Sit Sits safely with minimal use of hands    Transfers Able to transfer safely, definite need of hands    Standing Unsupported with Eyes Closed Able to stand 10 seconds safely    Standing Unsupported with Feet Together Able to place feet together independently and stand 1 minute safely    From Standing, Reach Forward with Outstretched Arm Can reach confidently >25 cm (10)    From Standing Position, Pick up Object from Floor Able to pick up shoe safely and easily    From Standing Position, Turn to Look Behind Over each Shoulder Looks behind one side only/other side shows less weight shift    Turn 360 Degrees Able to turn 360 degrees safely but slowly    Standing Unsupported, Alternately Place Feet on Step/Stool Able to stand independently and complete 8 steps >20 seconds    Standing Unsupported, One Foot in Front Able to plae foot ahead of the other independently and hold 30 seconds    Standing on One Leg Tries to lift leg/unable  to hold 3 seconds but remains standing independently    Total Score 46         10 Meter Walk Test: Patient instructed to walk 10 meters (32.8 ft) as quickly and as safely as possible at their normal speed x2 and at a fast speed x2. Time measured from 2 meter mark to 8 meter mark to accommodate ramp-up and ramp-down.   Average Normal speed: 0.67 m/s  Cut off scores: <0.4 m/s = household Ambulator, 0.4-0.8 m/s = limited community Ambulator, >0.8 m/s = community  Ambulator, >1.2 m/s = crossing a street, <1.0 = increased fall risk MCID 0.05 m/s (small), 0.13 m/s (moderate), 0.06 m/s (significant)  (ANPTA Core Set of Outcome Measures for Adults with Neurologic Conditions, 2018)  6 Min Walk Test:  Instructed patient to ambulate as quickly and as safely as possible for 6 minutes using LRAD. Patient was allowed to take standing rest breaks without stopping the test, but if the patient required a sitting rest break the clock would be stopped and the test would be over.  Results: 437ft with walking stick and multiple standing rest breaks. And supervision assist . Results indicate that the patient has reduced endurance with ambulation compared to age matched norms.  Age Matched Norms: 67-69 yo M: 54 F: 76, 63-79 yo M: 2 F: 471, 9-89 yo M: 417 F: 392 MDC: 58.21 meters (190.98 feet) or 50 meters (ANPTA Core Set of Outcome Measures for Adults with Neurologic Conditions, 2018)  CGA for safety throughout session unless other wise noted as well as mutliple therapeutic rest breaks between bouts due to fatigue and need for hydration.   PATIENT EDUCATION: Education details: PT plan of care; Discussion of symptoms of CVA and to go to ED as soon as possible. Purpose of PT for balance, strength, coordination and endurance.   Limitations of PT given need for knee replacement and need to continue to see progress to justify additional PT.   Benefits of hand splint on RW to allow use of RW following knee  replacement.    Person educated: Patient Education method: Explanation Education comprehension: verbalized understanding  HOME EXERCISE PROGRAM: Access Code: [email or web address] URL: [email or web address] Date: 12/17/2023 Prepared by: Sidra Simpers  Program Notes **Be sure to perform all exercises next to a countertop or sturdy piece of furniture in case you become unsteady.  Exercises - Standing Heel Raise with Support  - 1 x daily - 7 x weekly - 2 sets - 15 reps - Standing Toe Raises at Chair  - 1 x daily - 7 x weekly - 2 sets - 15 reps - Mini Squat with Counter Support  - 1 x daily - 7 x weekly - 2 sets - 15 reps - Lunge with Counter Support  - 1 x daily - 7 x weekly - 2 sets - 15 reps - Standing Tandem Balance with Counter Support  - 1 x daily - 7 x weekly - 2 sets - 2 reps - 30 hold - Standing Single Leg Stance with Counter Support  - 1 x daily - 7 x weekly - 2 sets - 2 reps - 30 hold  GOALS: Goals reviewed with patient? Yes  SHORT TERM GOALS: Target date: 01/21/2024  Pt will be independent with HEP in order to improve strength and balance in order to decrease fall risk and improve function at home.  Baseline: EVAL - No current formal HEP in place 03/04/2024- Patient reports independent with all current HEP and riding a stationary bike.  Goal status: MET   LONG TERM GOALS: Target date: 05/27/2024  1.  Patient will complete five times sit to stand test in < 15 seconds indicating an increased LE strength and improved balance. Baseline: EVAL= 25.20 sec with LUE Support 9/2: 23.36 without UE support and CGA-min assist. 20.10 sec with LUE support 03/04/2024- more difficulty raising without UE but performed 18.75 sec with LUE Support 04/06/24: 21.99s with L UE support on chair rail 12/4:16.59 sec with L UE assist  05/20/2024: 16.4 with L UE   Goal  status: IN PROGRESS   2.  Patient will increase Berg Balance score by > 6 points to demonstrate decreased fall risk during  functional activities. Baseline: EVAL: To be assessed next visit; 12/12/2023= 42/56 9/2: 44/56  10/16:45/56 12/4: 47 05/20/24: 46 Goal status: PROGRESSING   3.  Patient will reduce timed up and go to <11 seconds to reduce fall risk and demonstrate improved transfer/gait ability. Baseline: EVAL = 40 sec with RW 9/2: 18.33sec with SPC 10/16:17.47 sec with SPC 04/06/24: 20.17s w/ SPC 12/4:20.86 sec with SPC 16.58 sec with no AD  05/20/24:19.69 with walking stick 16.67 sec without AD  Goal status: PROGRESSING  4.  Patient will increase 10 meter walk test to >1.53m/s as to improve gait speed for better community ambulation and to reduce fall risk. Baseline: EVAL: 0.65 m/s 9/2: 0.67 m/s with [facility] 9/9: 0.71 m/s without an AD and wearing 2.5 # AW 10/16: .75 m/s with [facility] 04/06/24: 0.51 m/s 12/4: .675 m/s with SPC, .70 m/s no AD 05/20/24: 0.32m/s with walking stick  Goal status: PROGRESSING  5. Patient will increase six minute walk test distance by 200 feet for progression to community ambulator and improve gait ability Baseline: EVAL= 482' 9/2: 465 feet using a SPC with CGA required seated rest break at 4:17min   10/16: 542 ft and full 6 minutes  04/06/24: 525 ft using SPC with CGA, 3 rest breaks taken throughout, @ 2:31 min, 3:49min, and 5:49min 12/4:625 ft total mixed SPC and no SPC initial 300 ft no AD only one standing rest break allowed around 400 ft, encouraged pt to not rest during test.  05/20/24: 490ft with multiple standing rest breaks.  Goal status: PROGRESS    ASSESSMENT:  CLINICAL IMPRESSION:  Completed progress note and goal assessment on this day. Pt appears to have reached plateau in functional progress with no significant change in 5x STS, TUG, BERG, or . Pt was noted to have decreased distance on 6 min walk test but it was performed at the end of PT treatment, when pt was reporting increased fatigue. Due to lack of progress since prior assessment, will spend remainder of PT  treatments on transition ot home program. At this time, Pt will continue to benefit from skilled therapy to address remaining deficits in order to improve overall QoL and return to PLOF.   OBJECTIVE IMPAIRMENTS: Abnormal gait, decreased activity tolerance, decreased balance, decreased coordination, decreased endurance, decreased mobility, difficulty walking, decreased ROM, decreased strength, impaired UE functional use, and pain.   ACTIVITY LIMITATIONS: carrying, lifting, bending, sitting, standing, squatting, sleeping, stairs, and transfers  PARTICIPATION LIMITATIONS: meal prep, cleaning, laundry, driving, shopping, community activity, and yard work  PERSONAL FACTORS: 1-2 comorbidities: OA, previous CVA with significant R sided weakness are also affecting patient's functional outcome.   REHAB POTENTIAL: Good  CLINICAL DECISION MAKING: Stable/uncomplicated  EVALUATION COMPLEXITY: Moderate  PLAN:  PT FREQUENCY: 1-2x/week  PT DURATION: 12 weeks  PLANNED INTERVENTIONS: [id number]- PT Re-evaluation, 97750- Physical Performance Testing, 97110-Therapeutic exercises, 97530- Therapeutic activity, [id number]- Neuromuscular re-education, 97535- Self Care, [id number]- Manual therapy, [id number]- Gait training, [id number]- Orthotic Initial, [phone]- Orthotic/Prosthetic subsequent, [phone]- Canalith repositioning, [id number]- Electrical stimulation (manual), [phone] (1-2 muscles), [id number] (3+ muscles)- Dry Needling, Patient/Family education, Balance training, Stair training, Taping, Joint mobilization, Joint manipulation, Spinal manipulation, Spinal mobilization, Compression bandaging, Vestibular training, DME instructions, Cryotherapy, and Moist heat   PLAN FOR NEXT SESSION:   - continue ambulation distance and activity tolerance - general LE strengthening - dynamic balance training.  - expand  HEP   Massie Dollar PT, DPT  Physical Therapist - [place]  [facility]  3:18 PM 05/20/2024     "

## 2024-05-25 ENCOUNTER — Ambulatory Visit

## 2024-05-25 ENCOUNTER — Ambulatory Visit: Admitting: Physical Therapy

## 2024-05-25 ENCOUNTER — Encounter: Admitting: Speech Pathology

## 2024-05-25 DIAGNOSIS — R278 Other lack of coordination: Secondary | ICD-10-CM

## 2024-05-25 DIAGNOSIS — R269 Unspecified abnormalities of gait and mobility: Secondary | ICD-10-CM

## 2024-05-25 DIAGNOSIS — R296 Repeated falls: Secondary | ICD-10-CM

## 2024-05-25 DIAGNOSIS — M6281 Muscle weakness (generalized): Secondary | ICD-10-CM

## 2024-05-25 DIAGNOSIS — R262 Difficulty in walking, not elsewhere classified: Secondary | ICD-10-CM

## 2024-05-25 DIAGNOSIS — R2689 Other abnormalities of gait and mobility: Secondary | ICD-10-CM

## 2024-05-25 NOTE — Therapy (Unsigned)
 "  Outpatient Occupational Therapy Neuro Treatment Note   Patient Name: Erika Stanton MRN: [id number] DOB:19-Mar-1956, 69 y.o., female Today's Date: 05/25/2024  PCP: Laurice Anes, MD REFERRING PROVIDER: Laurice Anes, MD   OT End of Session - 05/25/24 1506     Visit Number 43    Number of Visits 50    Date for Recertification  06/19/24    Authorization Time Period Reporting period beginning 05/13/24    Progress Note Due on Visit 50    OT Start Time 1400    OT Stop Time 1445    OT Time Calculation (min) 45 min    Equipment Utilized During Treatment transport chair, walking stick    Activity Tolerance Patient tolerated treatment well    Behavior During Therapy WFL for tasks assessed/performed         Past Medical History:  Diagnosis Date   Anatomical narrow angle, bilateral    Aneurysm    Asthma    Cerebral aneurysm    Cerebral vasospasm    Cognitive change    Dysphagia, post-stroke    GERD (gastroesophageal reflux disease)    Hemorrhoids    History of cervical dysplasia    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 no recurrent polyps.  suggest repeat surveillance study 11/2022.     COLONOSCOPY W/ POLYPECTOMY  06/2014   Dr Elder at [facility].  3 adenomatoous polyps, anal fissure.     ESOPHAGOGASTRODUODENOSCOPY (EGD) WITH PROPOFOL  N/A 01/27/2020   Procedure: ESOPHAGOGASTRODUODENOSCOPY (EGD) WITH PROPOFOL ;  Surgeon: Shila Gustav GAILS, MD;  Location: [facility] END[facility];  Service: Endoscopy;  Laterality: N/A;   INSERTION OF PERMANENT INTRAPERITONEAL  CANNULA/CATH N/A    LAPAROSCOPIC   IR 3D INDEPENDENT WKST  01/06/2020   IR ANGIO INTRA EXTRACRAN SEL INTERNAL CAROTID BILAT MOD SED  01/06/2020   IR ANGIO VERTEBRAL SEL VERTEBRAL UNI L MOD SED  01/06/2020   IR ANGIOGRAM FOLLOW UP STUDY  01/06/2020   IR ANGIOGRAM FOLLOW UP STUDY  01/06/2020   IR ANGIOGRAM FOLLOW UP STUDY  01/06/2020   IR ANGIOGRAM FOLLOW UP STUDY  01/06/2020   IR ANGIOGRAM FOLLOW UP STUDY  01/06/2020   IR ANGIOGRAM FOLLOW UP STUDY  01/06/2020   IR ANGIOGRAM FOLLOW UP STUDY  01/06/2020   IR ANGIOGRAM FOLLOW UP STUDY  01/06/2020   IR ANGIOGRAM FOLLOW UP STUDY  01/06/2020   IR ANGIOGRAM FOLLOW UP STUDY  01/06/2020   IR NEURO EACH ADD'L AFTER BASIC UNI RIGHT (MS)  01/06/2020   IR TRANSCATH/EMBOLIZ  01/06/2020   MASTECTOMY     MOHS SURGERY N/A    RADIOLOGY WITH ANESTHESIA N/A 01/06/2020   Procedure: IR WITH ANESTHESIA FOR ANEURYSM;  Surgeon: Lanis Pupa, MD;  Location: [facility] OR;  Service: Radiology;  Laterality: N/A;   STENT SUPPORTED EMBOLIZATION OF RIGHT ICA ANEURYSM Right    TUBAL LIGATION     VENTRICULO-PERITONEAL SHUNT PLACEMENT / LAPAROSCOPIC INSERTION PERITONEAL CATHETER N/A    Patient Active Problem List  Diagnosis Date Noted   Dyspareunia in female 04/05/2020   History of abnormal cervical Pap smear 03/10/2020   GIB (gastrointestinal bleeding) 02/28/2020   Elevated BUN    Prediabetes    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   Cerebral vasospasm    Sinus tachycardia    Dysphagia, post-stroke    Thrombocytopenia    Acute blood loss anemia    Brain aneurysm    ICH (intracerebral hemorrhage) (HCC) 01/05/2020   History of adenomatous polyp of colon 02/07/2016   Anatomical narrow angle, bilateral 12/14/2014   Fissure in ano 11/11/2014   Hemorrhoids 11/11/2014   Constipation 11/19/2013   GERD (gastroesophageal reflux disease) 03/24/2013   Irritable bowel syndrome with diarrhea 03/24/2013   Herpes zoster 05/14/2005   Abnormal Pap smear of cervix  05/15/1983   History of cervical dysplasia 05/15/1983   ONSET DATE: 12/02/2023  REFERRING DIAG: L ischemic CVA  THERAPY DIAG:  Muscle weakness (generalized)  Other lack of coordination  Rationale for Evaluation and Treatment: Rehabilitation  SUBJECTIVE:  SUBJECTIVE STATEMENT: Pt reports feeling like she did a little better signing her receipt when checking in for therapy up front today.  PERTINENT HISTORY:  Pt. was admitted to [facility] from 12/02/23-12/06/23 with an Acute Right MCA CVA, Hydrocephalus with shunt  placement. History of Subarachnoid Hemorrhage with bilateral ICA coiled aneurysms. 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  PRECAUTIONS: None  WEIGHT BEARING RESTRICTIONS: No  PAIN: 05/20/24: No UE pain, 4/10 in knees Are you having pain? No  FALLS: Has patient fallen in last 6 months? Yes. Number of falls 1  LIVING ENVIRONMENT: Lives with: lives with their family and lives with their spouse Lives in: House/apartment Stairs: hand rails, 2 steps   Has following equipment at home: [name], cane  PLOF: Independent with basic ADLs, Spouse help as needed  PATIENT GOALS: Strengthening  OBJECTIVE:  Note: Objective measures were completed at Evaluation unless otherwise noted.  HAND DOMINANCE: Left  ADLs: Overall ADLs: spouse assists when needed, Pt. reports that she can do most tasks herself. Uses her left hand to engage in self-care tasks. Eating: Able to cut foods with a fork, husband assists as needed.  Grooming: husband assists with applying toothpaste, able to brush teeth and hair UB Dressing: Spouse assists with applying/fastening bra, Pt. Requires assistance with zippers, and buttons. LB  Dressing: Spouse assists with tying shoes when needed, uses slip on shoes. Pt. Does not zip pants. Toileting: Able to complete independently Bathing: Pt. Requires intermittent assistance with bathing tasks. Tub Shower transfers: Spouse assists with transfers when/if needed Equipment: Shower seat with back  IADLs: Shopping: Spouse typically does the shopping, isnt able to carry heavy objects.  Light housekeeping: Spouse typically does most of the cleaning around the home, Pt. Cleans bathroom, and laundry Pt. Is unable to carry laundry due to exacerbation of weakness.  Meal Prep: Spouse typically cooks and prepares meals.  Community mobility: Pt. Requires assistance using walker. Pt. reports that she is unable to carry heavy items, and typically does not do the shopping.  Medication management: Pt. is able to take care of meds with pillbox Financial management:  Spouse takes care of it, and makes online payments.  Handwriting: 50% legible  MOBILITY STATUS: Needs Assist: Pt. Requires use of  a walker   POSTURE COMMENTS:  No Significant postural limitations Sitting balance: Good  ACTIVITY TOLERANCE: Activity tolerance:   FUNCTIONAL OUTCOME MEASURES: TBD  UPPER EXTREMITY ROM:    Active ROM Right eval Right 01/16/24 Right 03/02/24 Right 04/01/24  Right 05/13/24 Left eval Left 01/16/24 Left 03/02/24 Left 04/01/24 Left 05/13/24  Shoulder flexion 94(121) 100 with scaption (132) 126(146) 100 some scaption (145) 100 some scaption (155) 102(125) 134 with scaption (145) 142(148) 140 (155) 140 (155)  Shoulder abduction 75(106) 74 (98) 93(120) 58(90) 85 (131) 110(126) 140 (170) WNL 165 (180) 165 (180)  Shoulder adduction            Shoulder extension            Shoulder internal rotation            Shoulder external rotation            Elbow flexion 135(155)  150(155)   140(150)  150    Elbow extension 0  0   -10(-9)  -4(0)    Wrist flexion 40(44)  48(60)  50 (65) 42(58)  52(60)  88   Wrist extension 30(34)  48(60)  30 (30 limited by spasticity today) 40(50)  52(56)  55  Wrist ulnar deviation            Wrist radial deviation            Wrist pronation            Wrist supination            (Blank rows = not tested)  UPPER EXTREMITY MMT:     MMT Right eval Right 03/02/24 Right 04/01/24 Right 05/13/24 Left eval Left 03/02/24 Left 04/01/24 Left 05/13/24  Shoulder flexion 3-/5 3+/5 3- 3- 3-/5 4/5 4/5 4+/5  Shoulder abduction 3-/5 3-/5 3- 3- 3-/5 4/5 4/5 4+/5  Shoulder adduction          Shoulder extension          Shoulder internal rotation          Shoulder external rotation          Middle trapezius          Lower trapezius          Elbow flexion 4-/5 4+/5 4+ 4+ 4+/5 5/5 5/5 5/5  Elbow extension 4-/5 4+/5 4/5 4/5 4+/5 5/5 5/5 5/5  Wrist flexion 4-/5 4-/5 4/5 4/5 (within ROM limits) 3+/5 4/5 4+/5 4+/5  Wrist extension 4-/5 4-/5 4- 4-/5 (within ROM limits) 3+/5 4/5 4+/5 5/5  Wrist ulnar deviation          Wrist radial deviation          Wrist pronation          Wrist supination          (Blank rows = not tested)  HAND FUNCTION Eval:  Grip strength: Right: N/T lbs; Left: 43 lbs, Lateral pinch: Right: N/T lbs, Left: 9 lbs, and 3 point pinch: Right: N/T lbs, Left: 5 lbs Pt. Presents with flexor tightness in the right 5th digit, however is able to passively extend 5th digit within normal range. Pt. Also Presents with hyper extension in the 2nd and 3rd digit at PIP joints. Pt. Is able to achieve full Digit MP, PIP, and DIP PROM.  01/16/24: Grip strength: Left: 33 lbs; Lateral pinching: Left: 8 lbs; 3 point pinch: Left: 3 lbs  03/02/24: Grip strength: Left: 36 lbs; Lateral pinching: Left: 8 lbs; 3  point pinch: Left: 3 lbs  04/01/24: Grip strength: Left: (3 trials) 32 lbs, 26 lbs, 34 lbs; Lateral pinch: Pinch gauge unavailable (out for re-calibration)  05/13/24: Grip strength: Left: 30 lbs, 29 lbs, 28 lbs, Lateral pinch: Left: 9 lbs, 3 point pinch: Left: 10  lbs   COORDINATION: 9 Hole Peg test: Right: N/T sec; Left: 51 sec 01/16/24: Left: 1 min 1 sec 03/02/24:  Left: 1 min 1 sec 04/01/24: Left: 58 sec  05/13/24: Left: 43 sec   SENSATION: Not tested  EDEMA:   MUSCLE TONE: Fluctuating flexor tone in R hand.  COGNITION: Overall cognitive status: Within functional limits for tasks assessed  VISION: Subjective report:  Baseline vision:  Visual history:   VISION ASSESSMENT:  PERCEPTION:   PRAXIS:   OBSERVATIONS: Pt. presents with fluctuating flexor tone in the R hand, involving the 5th digit at the PIP/DIP joint. Pt. presents with hyperextension at the 2nd and 3rd digits at the PIP joints.                                                                                                                    TREATMENT DATE: 05/20/24 Self Care: -Facilitated handwriting skills with L hand; pt practiced printing, signing name, and simulating a greeting card by drawing a heart and signing pt's name within a designated space at the bottom of a small piece of paper. Min vc for L paper positioning and use of dycem to stabilize paper on table.   Therapeutic Exercise: -L grip strengthening: 5x15 reps with hand gripper (2 red bands for resistance); set up assist and vc for maximizing end range digit flexion for complete closed fist   Therapeutic Activity: -Facilitated L lateral and 3 point pinch strengthening moving clothespins on/off a vertical dowel.  Min vc/visual cues for pinch prehension patterns.  Able to tolerate yellow, red, and green pins; successful with 1 or 2 blue clips with lateral pinch only.    PATIENT EDUCATION: Education details: L hand strengthening  Person educated: Patient Education method: Verbal cues Education comprehension: verbal cues required  HOME EXERCISE PROGRAM: -Pink theraputty; visual handout issued -Self and caregiver assisted PROM to R hand and wrist -Monitor splint with skin checks  GOALS: Goals reviewed with  patient? Yes  SHORT TERM GOALS: Target date: 04/13/2024   Pt. Will be independent with HEP for UE functioning. Baseline: Eval: No current HEP; 01/16/24: pt reports doing a little writing and squeezing a ball, but denies using putty; spouse stretches RUE daily 02/20/24:  Pt. Continues to do a little writing and squeezing a ball, but denies using putty; spouse stretches RUE 03/02/24: Pt. Husband assists Pt. With ROM to the right hand. Pt. reports doing hand exercises; 03/30/24: Spouse indep with HEP (spouse assists daily) Goal status: achieved  LONG TERM GOALS: Target date: 06/19/2024  Pt. Will improve [facility] skills to be able to manipulate small objects at home Baseline: 9 hole peg test: L: 51 seconds; 01/16/24: L 1 min 1 sec, 03/02/24: Left: 1 min. &  1 sec. Pt. Continues to work towards using the left to manipulate small objects; 04/01/24: L 58 sec (multiple trials to improve time); 05/13/24: L 43 sec  Goal status: Improved/Ongoing  2.  Pt. Will increase BUE shoulder ROM by 10 degrees to be able to reach into cabinets and shelves.  Baseline: Shoulder flexion: R: 94(121), L: 102(125), Shoulder abduction: R: 75(106), L: 110(126); 01/16/24: Shoulder flexion: R: 100 with scaption (132), L: 134 with scaption (145), Shoulder abduction: R: 74 (98), L: 140 (170)02/20/24: Pt. Is improving with functional reaching through multiple planes with cues, and assist required. 03/02/24: Shoulder flexion: R: 126(146), L: 142(148), Shoulder abduction: R: 93(120), L: WNL; 03/30/24: ROM to be updated next session; limited by time constraints with poc review, though pt reports she does not reach into cabinets; 04/01/24: Shoulder flexion: R: 100 (145), L: 140 (155); Shoulder abd: R: 50 (90), L: 165 (180); pt reports she does not reach into cabinets; 05/13/24: Shoulder flexion: R: 100 (155), L: 140 (155); Shoulder abd: R: 85 (131), L: 165 (180) Goal status: Ongoing  3.  Pt. Will improve L grip strength by 5# to be able to securely  grasp ADL items at home.  Baseline: Grip strength: R: NT, L: 43#; 01/16/24: L: 33#  03/02/24: 36#; 04/01/24: L: 34 lbs (best of 3 trials); 05/13/24: L 30 lbs (best of 3 trials) Goal status: Ongoing  4.  Pt. Will increase L lateral key pinch strength by 3#  to open wide mouth jars.  Baseline: Lateral key pinch: R: NT, L: 9#; 01/16/24: L: 8# 03/02/24: Pinch meter is out for calibration. Pt. Continues to have difficulty opening jars; 04/01/24: NT (pinch gauge not available); continued difficulty opening jars; 05/13/24: L: 9 lbs Goal status: Ongoing  5.  Pt. Will independently engage the right hand as a gross assist to the left hand 100% of the time during ADLs/IADLs. Baseline: Eval: Pt. With limited engagement of the right hand as a gross assist during tasks; 01/16/24: Observed <25% of the time during OT sessions 02/20/24: Pt. Continues to present with limited engagement of the RUE as a gross assist to the left hand approximately 25% of the time with cues. 03/02/24: Pt. Is starting to engage her right hand more as a gross assist to the left; 04/01/24: Pt engages the R hand as a fair assist to the L with intermittent vc from OT during tx sessions; pt admits she needs to use the R hand more at home; 05/13/24: Intermittent vc needed  Goal status: Ongoing  6.  Pt. Will be able to manipulate zippers, and buttons efficiently with modified independence Baseline: Eval:Right: NT Left: 51 sec; 01/16/24: Left: 1 min and 1 sec 02/20/24: Pt. continues to have difficulty manipulating zippers and buttons.03/02/24: Pt. continues to have difficulty manipulating zippers, and buttons, and may benefit from reviewing adaptive devices; 04/01/24: Pt reports that she continues to avoid clothes with fasteners; 05/13/24: Pt still wears slip on clothing without clothing fasteners, but does report improvement in manipulation of zipper and snaps on purse and wallet.  Goal status: Ongoing   7. Pt. Will improve handwriting legibility to 100%  for one sentence in printed form for written correspondence.  Baseline: 50% legible in printed form for first name only; 01/16/24: 80-90% legible to print first name 1 of 4 attempts.  3 of 4 attempts 50% or less legible 02/20/24: first name only 100%, 25% for lists of words. 03/02/24:  first name only 100%, 25% for lists of words; 04/01/24: 100%  legibility for a short 3 word sentence using L hand.  Goal status: achieved  8. Pt. Will independently, and efficiently transfer small 1/2 objects from the left hand to the right hand in midline in preparation from reaching up to place items at a target with 100% accuracy with the right hand. Baseline: 03/02/24: Pt. Has progressed to transferring 1.5 objects between her hand in midline, and reach up to place them at targets with 60% accuracy; 03/30/24: Pt can transfer jumbo pegs and clothespins (~1/5) from R to L hand with extra time, repeat trials, and occasional dropping. Pt continues to reach toward a target with ~60% accuracy; 05/13/24: goal deferred d/t focus primarily on L hand strength/coordination.  Pt addresses RUE function with a myofascial release provider.   Goal Status: ongoing  9. Pt. Will increase BUE strength by 2 mm grades to assist with ADLs, and IADLs.  Baseline: 03/02/24: UE strength: Shoulder flexion: R: 3+/5 L: 4/5, abduction: R: 3-/5, L 4/5:  elbow flexion: R:4+/5, L: 5/5, extension: R: 4+/5, L:5/5, wrist flexion: R: 4-/5  L: 4/5 wrist extension: R: 4-/5 L: 4/5; 04/01/24: see chart; no significant changes; 05/13/24: improvement in L shoulder flex/abd and wrist ext since last assessment period  Goal Status: achieved for LUE; d/c RUE goals as pt addresses RUE function with a myofascial release provider  10.  Pt will use L hand to write a grocery list of 10 items with at least 75% legibility.    Baseline: 05/13/24: 25-50% legibility to write 1 word lists using L hand.   Goal status: New  ASSESSMENT: CLINICAL IMPRESSION: Pt made a good  attempts to stabilize paper with R hand on table top, but ultimately required dycem beneath paper to prevent sliding while writing with L hand.  Pt required occasional tactile cues for use of tripod grasp around pen for better pend stability.  Min vc for picking up pen between letters to improve legibility.  Pt tolerated 5 sets on hand gripper today, and 1-2 blue clips using a lateral pinch on the L hand.  Continued focus on grip and pinch strengthening in the L hand to maximize stability of ADL supplies and utensils in the L hand.  Pt will continue to benefit from skilled OT to address above noted goals, working towards increased indep with daily tasks and reduced burden of care on caregiver.   PERFORMANCE DEFICITS: in functional skills including ADLs, IADLs, coordination, dexterity, edema, tone, ROM, strength, pain, Fine motor control, Gross motor control, endurance, and UE functional use, and psychosocial skills including coping strategies, environmental adaptation, habits, and routines and behaviors.   IMPAIRMENTS: are limiting patient from ADLs, IADLs, rest and sleep, leisure, and social participation.   CO-MORBIDITIES: may have co-morbidities  that affects occupational performance. Patient will benefit from skilled OT to address above impairments and improve overall function.  MODIFICATION OR ASSISTANCE TO COMPLETE EVALUATION: Min-Moderate modification of tasks or assist with assess necessary to complete an evaluation.  OT OCCUPATIONAL PROFILE AND HISTORY: Detailed assessment: Review of records and additional review of physical, cognitive, psychosocial history related to current functional performance.  CLINICAL DECISION MAKING: Moderate - several treatment options, min-mod task modification necessary  REHAB POTENTIAL: Good  EVALUATION COMPLEXITY: Moderate  PLAN:  OT FREQUENCY: 2x/week  OT DURATION: other: 5 weeks  PLANNED INTERVENTIONS: 97168 OT Re-evaluation, 97535 self care/ADL  training, 02889 therapeutic exercise, 97530 therapeutic activity, 97112 neuromuscular re-education, 97140 manual therapy, 97018 paraffin, 02960 fluidotherapy, 97010 moist heat, 97010 cryotherapy, 97034 contrast bath, [id number]  electrical stimulation (manual), 02239 Orthotic Initial, passive range of motion, energy conservation, coping strategies training, patient/family education, and DME and/or AE instructions  RECOMMENDED OTHER SERVICES: PT   CONSULTED AND AGREED WITH PLAN OF CARE: Patient  PLAN FOR NEXT SESSION: see above  Inocente Blazing, MS, OTR/L     "

## 2024-05-25 NOTE — Therapy (Unsigned)
 " OUTPATIENT PHYSICAL THERAPY TREATMENT /  Patient Name: Erika Stanton MRN: [id number] DOB:1955-11-12, 69 y.o., female Today's Date: 05/25/2024  PCP: Dr. Oneil Galloway  REFERRING PROVIDER: Dr. Oneil Galloway  END OF SESSION:   PT End of Session - 05/25/24 1525     Visit Number 41    Number of Visits 45    Date for Recertification  05/27/24    Authorization Type [employer]    Progress Note Due on Visit 40    PT Start Time 1530    PT Stop Time 1610    PT Time Calculation (min) 40 min    Equipment Utilized During Treatment Gait belt    Activity Tolerance Patient tolerated treatment well    Behavior During Therapy WFL for tasks assessed/performed                Past Medical History:  Diagnosis Date   Anatomical narrow angle, bilateral    Aneurysm    Asthma    Cerebral aneurysm    Cerebral vasospasm    Cognitive change    Dysphagia, post-stroke    GERD (gastroesophageal reflux disease)    Hemorrhoids    History of cervical dysplasia    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 no recurrent polyps.  suggest repeat surveillance study 11/2022.     COLONOSCOPY W/ POLYPECTOMY  06/2014   Dr Elder at [facility].  3 adenomatoous polyps, anal fissure.     ESOPHAGOGASTRODUODENOSCOPY (EGD) WITH PROPOFOL  N/A 01/27/2020   Procedure: ESOPHAGOGASTRODUODENOSCOPY (EGD) WITH PROPOFOL ;  Surgeon: Shila Gustav GAILS, MD;  Location: [facility] END[facility]COPY;  Service: Endoscopy;  Laterality: N/A;   INSERTION OF PERMANENT INTRAPERITONEAL CANNULA/CATH N/A    LAPAROSCOPIC   IR  3D INDEPENDENT WKST  01/06/2020   IR ANGIO INTRA EXTRACRAN SEL INTERNAL CAROTID BILAT MOD SED  01/06/2020   IR ANGIO VERTEBRAL SEL VERTEBRAL UNI L MOD SED  01/06/2020   IR ANGIOGRAM FOLLOW UP STUDY  01/06/2020   IR ANGIOGRAM FOLLOW UP STUDY  01/06/2020   IR ANGIOGRAM FOLLOW UP STUDY  01/06/2020   IR ANGIOGRAM FOLLOW UP STUDY  01/06/2020   IR ANGIOGRAM FOLLOW UP STUDY  01/06/2020   IR ANGIOGRAM FOLLOW UP STUDY  01/06/2020   IR ANGIOGRAM FOLLOW UP STUDY  01/06/2020   IR ANGIOGRAM FOLLOW UP STUDY  01/06/2020   IR ANGIOGRAM FOLLOW UP STUDY  01/06/2020   IR ANGIOGRAM FOLLOW UP STUDY  01/06/2020   IR NEURO EACH ADD'L AFTER BASIC UNI RIGHT (MS)  01/06/2020   IR TRANSCATH/EMBOLIZ  01/06/2020   MASTECTOMY     MOHS SURGERY N/A    RADIOLOGY WITH ANESTHESIA N/A 01/06/2020   Procedure: IR WITH ANESTHESIA FOR ANEURYSM;  Surgeon: Lanis Pupa, MD;  Location: [facility] OR;  Service: Radiology;  Laterality: N/A;   STENT SUPPORTED EMBOLIZATION OF RIGHT ICA ANEURYSM Right    TUBAL LIGATION     VENTRICULO-PERITONEAL SHUNT PLACEMENT / LAPAROSCOPIC INSERTION PERITONEAL CATHETER N/A    Patient  Active Problem List   Diagnosis Date Noted   Dyspareunia in female 04/05/2020   History of abnormal cervical Pap smear 03/10/2020   GIB (gastrointestinal bleeding) 02/28/2020   Elevated BUN    Prediabetes    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   Cerebral vasospasm    Sinus tachycardia    Dysphagia, post-stroke    Thrombocytopenia    Acute blood loss anemia    Brain aneurysm    ICH (intracerebral hemorrhage) (HCC) 01/05/2020   History of adenomatous polyp of colon 02/07/2016   Anatomical narrow angle, bilateral 12/14/2014   Fissure in ano 11/11/2014   Hemorrhoids 11/11/2014   Constipation 11/19/2013   GERD (gastroesophageal reflux disease) 03/24/2013   Irritable bowel syndrome with diarrhea 03/24/2013   Herpes zoster 05/14/2005   Abnormal Pap smear of cervix 05/15/1983   History of cervical  dysplasia 05/15/1983    ONSET DATE: 12/02/2023  REFERRING DIAG: I63.9 (ICD-10-CM) - CVA (cerebral vascular accident) (HCC)   THERAPY DIAG:  Muscle weakness (generalized)  Difficulty in walking, not elsewhere classified  Repeated falls  Abnormality of gait and mobility  Other lack of coordination  Other abnormalities of gait and mobility  Rationale for Evaluation and Treatment: Rehabilitation  SUBJECTIVE:                                                                                                                                                                                             SUBJECTIVE STATEMENT:   Pt states she feels okay today.  Would like to review and increase HEP for transition to community/home management.  PERTINENT HISTORY:   Patient reports she was in hospital from [facility]- Sat (July 21st- 25th) with CVA affecting left side. I was supposed to have a knee replacement surgery in September but that is on hold for now. I was doing my prep for my colonoscopy and fell in the bathroom.  Patient reports having mild left sided weakness compared to her CVA in 2021 with heavy Right sided UE weakness. She reports bad OA affecting both knees as well. Previous CVA in 2021 affecting Right side. Reports going to ED  last Monday due to fall/CVA affecting Left side. Diagnosed with R CVA with Left Sided weakness.    PAIN:  Are you having pain? B pain knees, 3/10  PRECAUTIONS: Fall  WEIGHT BEARING RESTRICTIONS: No  FALLS: Has patient fallen in last 6 months? Yes. Number of falls fell with the stroke  PATIENT GOALS: Walk without any assistive device.  OBJECTIVE:  Note: Objective measures were completed at evaluation unless otherwise noted.  TREATMENT DATE : 05/25/2024  Nustep, rolling hill program, 4 min, levels 1-3, seat 10, arms 10 use BLE and  LUE throughout. R hand splint in place    HEP review/expansion   Access Code: [email or web address] URL: [email or web address] Date: 05/26/2024 Prepared by: Massie Dollar  Program Notes **Be sure to perform all exercises next to a countertop or sturdy piece of furniture in case you become unsteady.  Exercises - Mini Squat with Counter Support  -12 reps - Lunge with Counter Support  - 10 reps - Standing Tandem Balance with Counter Support   - 6 reps - 15 hold - Standing Single Leg Stance with Counter Support - 4 reps - 15 hold ( unable to perform on the LLE due to knee pain.  - Heel Toe Raises with Counter Support  - 10 reps - Side Stepping with Counter Support  6 reps - Standing Hip Extension with Counter Support  - 10 reps  CGA for safety throughout session unless other wise noted as well as mutliple therapeutic rest breaks between bouts due to fatigue and need for hydration.   PATIENT EDUCATION: Education details: PT plan of care; Discussion of symptoms of CVA and to go to ED as soon as possible. Purpose of PT for balance, strength, coordination and endurance.   Limitations of PT given need for knee replacement and need to continue to see progress to justify additional PT.   Benefits of hand splint on RW to allow use of RW following knee replacement.    Person educated: Patient Education method: Explanation Education comprehension: verbalized understanding  HOME EXERCISE PROGRAM: Access Code: [email or web address] URL: [email or web address] Date: 05/26/2024 Prepared by: Massie Dollar  Program Notes **Be sure to perform all exercises next to a countertop or sturdy piece of furniture in case you become unsteady.  Exercises - Mini Squat with Counter Support  - 1 x daily - 7 x weekly - 2 sets - 12 reps - Lunge with Counter Support  - 1 x daily - 7 x weekly - 2 sets - 10 reps - Standing Tandem Balance with Counter Support  - 1 x daily - 7 x weekly - 2 sets - 6 reps - 15  hold - Standing Single Leg Stance with Counter Support  - 1 x daily - 7 x weekly - 2 sets - 4 reps - 15 hold - Heel Toe Raises with Counter Support  - 1 x daily - 7 x weekly - 2 sets - 10 reps - Side Stepping with Counter Support  - 1 x daily - 7 x weekly - 2 sets - 6 reps - Standing Hip Extension with Counter Support  - 1 x daily - 7 x weekly - 2 sets - 10 reps  GOALS: Goals reviewed with patient? Yes  SHORT TERM GOALS: Target date: 01/21/2024  Pt will be independent with HEP in order to improve strength and balance in order to decrease fall risk and improve function at home.  Baseline: EVAL - No current formal HEP in place 03/04/2024- Patient reports independent with all current HEP and riding a stationary bike.  Goal status: MET   LONG TERM GOALS: Target date: 05/27/2024  1.  Patient will complete five times sit to stand test in < 15 seconds indicating an increased LE strength and improved balance. Baseline: EVAL= 25.20 sec with LUE Support 9/2: 23.36 without UE support and CGA-min assist. 20.10 sec with LUE support 03/04/2024- more difficulty raising without UE but performed 18.75 sec with LUE Support 04/06/24:  21.99s with L UE support on chair rail 12/4:16.59 sec with L UE assist  05/20/2024: 16.4 with L UE   Goal status: IN PROGRESS   2.  Patient will increase Berg Balance score by > 6 points to demonstrate decreased fall risk during functional activities. Baseline: EVAL: To be assessed next visit; 12/12/2023= 42/56 9/2: 44/56  10/16:45/56 12/4: 47 05/20/24: 46 Goal status: PROGRESSING   3.  Patient will reduce timed up and go to <11 seconds to reduce fall risk and demonstrate improved transfer/gait ability. Baseline: EVAL = 40 sec with RW 9/2: 18.33sec with SPC 10/16:17.47 sec with SPC 04/06/24: 20.17s w/ SPC 12/4:20.86 sec with SPC 16.58 sec with no AD  05/20/24:19.69 with walking stick 16.67 sec without AD  Goal status: PROGRESSING  4.  Patient will increase 10 meter  walk test to >1.9m/s as to improve gait speed for better community ambulation and to reduce fall risk. Baseline: EVAL: 0.65 m/s 9/2: 0.67 m/s with [facility] 9/9: 0.71 m/s without an AD and wearing 2.5 # AW 10/16: .75 m/s with [facility] 04/06/24: 0.51 m/s 12/4: .675 m/s with SPC, .70 m/s no AD 05/20/24: 0.30m/s with walking stick  Goal status: PROGRESSING  5. Patient will increase six minute walk test distance by 200 feet for progression to community ambulator and improve gait ability Baseline: EVAL= 482' 9/2: 465 feet using a SPC with CGA required seated rest break at 4:19min   10/16: 542 ft and full 6 minutes  04/06/24: 525 ft using SPC with CGA, 3 rest breaks taken throughout, @ 2:31 min, 3:55min, and 5:24min 12/4:625 ft total mixed SPC and no SPC initial 300 ft no AD only one standing rest break allowed around 400 ft, encouraged pt to not rest during test.  05/20/24: 478ft with multiple standing rest breaks.  Goal status: PROGRESS    ASSESSMENT:  CLINICAL IMPRESSION:  Pt put forth good effort towards HEP expansion. L knee limited SLS, but all other exercises performed well. Required prolonged rest breaks between interventions due to pain in the knee and mild SOB. Hand out provided HEP. Will provide seated and supine HEP alternatives at next session. At this time, Pt will continue to benefit from skilled therapy to address remaining deficits in order to improve overall QoL and return to PLOF.   OBJECTIVE IMPAIRMENTS: Abnormal gait, decreased activity tolerance, decreased balance, decreased coordination, decreased endurance, decreased mobility, difficulty walking, decreased ROM, decreased strength, impaired UE functional use, and pain.   ACTIVITY LIMITATIONS: carrying, lifting, bending, sitting, standing, squatting, sleeping, stairs, and transfers  PARTICIPATION LIMITATIONS: meal prep, cleaning, laundry, driving, shopping, community activity, and yard work  PERSONAL FACTORS: 1-2 comorbidities: OA,  previous CVA with significant R sided weakness are also affecting patient's functional outcome.   REHAB POTENTIAL: Good  CLINICAL DECISION MAKING: Stable/uncomplicated  EVALUATION COMPLEXITY: Moderate  PLAN:  PT FREQUENCY: 1-2x/week  PT DURATION: 12 weeks  PLANNED INTERVENTIONS: [id number]- PT Re-evaluation, 97750- Physical Performance Testing, 97110-Therapeutic exercises, 97530- Therapeutic activity, [id number]- Neuromuscular re-education, 97535- Self Care, [id number]- Manual therapy, [id number]- Gait training, [id number]- Orthotic Initial, [id number]- Orthotic/Prosthetic subsequent, [phone]- Canalith repositioning, [id number]- Electrical stimulation (manual), [phone] (1-2 muscles), [id number] (3+ muscles)- Dry Needling, Patient/Family education, Balance training, Stair training, Taping, Joint mobilization, Joint manipulation, Spinal manipulation, Spinal mobilization, Compression bandaging, Vestibular training, DME instructions, Cryotherapy, and Moist heat   PLAN FOR NEXT SESSION:   Seated and supine HEP.  D/c at next visit   Massie Dollar PT, DPT  Physical Therapist - [facility]  3:26 PM 05/25/2024     "

## 2024-05-27 ENCOUNTER — Ambulatory Visit

## 2024-05-27 ENCOUNTER — Encounter: Admitting: Speech Pathology

## 2024-05-27 ENCOUNTER — Ambulatory Visit: Admitting: Physical Therapy

## 2024-05-27 DIAGNOSIS — R278 Other lack of coordination: Secondary | ICD-10-CM

## 2024-05-27 DIAGNOSIS — R2689 Other abnormalities of gait and mobility: Secondary | ICD-10-CM

## 2024-05-27 DIAGNOSIS — R269 Unspecified abnormalities of gait and mobility: Secondary | ICD-10-CM

## 2024-05-27 DIAGNOSIS — M6281 Muscle weakness (generalized): Secondary | ICD-10-CM

## 2024-05-27 DIAGNOSIS — R296 Repeated falls: Secondary | ICD-10-CM

## 2024-05-27 DIAGNOSIS — R262 Difficulty in walking, not elsewhere classified: Secondary | ICD-10-CM

## 2024-05-27 NOTE — Therapy (Signed)
 " OUTPATIENT PHYSICAL THERAPY TREATMENT / discharge assessment   Patient Name: Erika Stanton MRN: [id number] DOB:1955/07/06, 69 y.o., female Today's Date: 05/27/2024  PCP: Dr. Oneil Galloway  REFERRING PROVIDER: Dr. Oneil Galloway  END OF SESSION:   PT End of Session - 05/27/24 1445     Visit Number 42    Number of Visits 45    Date for Recertification  05/27/24    Authorization Type [employer]    Progress Note Due on Visit 40    PT Start Time 1445    PT Stop Time 1525    PT Time Calculation (min) 40 min    Equipment Utilized During Treatment Gait belt    Activity Tolerance Patient tolerated treatment well    Behavior During Therapy WFL for tasks assessed/performed                Past Medical History:  Diagnosis Date   Anatomical narrow angle, bilateral    Aneurysm    Asthma    Cerebral aneurysm    Cerebral vasospasm    Cognitive change    Dysphagia, post-stroke    GERD (gastroesophageal reflux disease)    Hemorrhoids    History of cervical dysplasia    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 no recurrent polyps.  suggest repeat surveillance study 11/2022.     COLONOSCOPY W/ POLYPECTOMY  06/2014   Dr Elder at [facility].  3 adenomatoous polyps, anal fissure.     ESOPHAGOGASTRODUODENOSCOPY (EGD) WITH PROPOFOL  N/A 01/27/2020   Procedure: ESOPHAGOGASTRODUODENOSCOPY (EGD) WITH PROPOFOL ;  Surgeon: Shila Gustav GAILS, MD;  Location: [facility] END[facility]COPY;  Service: Endoscopy;  Laterality: N/A;   INSERTION OF PERMANENT INTRAPERITONEAL CANNULA/CATH N/A     LAPAROSCOPIC   IR 3D INDEPENDENT WKST  01/06/2020   IR ANGIO INTRA EXTRACRAN SEL INTERNAL CAROTID BILAT MOD SED  01/06/2020   IR ANGIO VERTEBRAL SEL VERTEBRAL UNI L MOD SED  01/06/2020   IR ANGIOGRAM FOLLOW UP STUDY  01/06/2020   IR ANGIOGRAM FOLLOW UP STUDY  01/06/2020   IR ANGIOGRAM FOLLOW UP STUDY  01/06/2020   IR ANGIOGRAM FOLLOW UP STUDY  01/06/2020   IR ANGIOGRAM FOLLOW UP STUDY  01/06/2020   IR ANGIOGRAM FOLLOW UP STUDY  01/06/2020   IR ANGIOGRAM FOLLOW UP STUDY  01/06/2020   IR ANGIOGRAM FOLLOW UP STUDY  01/06/2020   IR ANGIOGRAM FOLLOW UP STUDY  01/06/2020   IR ANGIOGRAM FOLLOW UP STUDY  01/06/2020   IR NEURO EACH ADD'L AFTER BASIC UNI RIGHT (MS)  01/06/2020   IR TRANSCATH/EMBOLIZ  01/06/2020   MASTECTOMY     MOHS SURGERY N/A    RADIOLOGY WITH ANESTHESIA N/A 01/06/2020   Procedure: IR WITH ANESTHESIA FOR ANEURYSM;  Surgeon: Lanis Pupa, MD;  Location: [facility] OR;  Service: Radiology;  Laterality: N/A;   STENT SUPPORTED EMBOLIZATION OF RIGHT ICA ANEURYSM Right    TUBAL LIGATION     VENTRICULO-PERITONEAL SHUNT PLACEMENT / LAPAROSCOPIC INSERTION PERITONEAL CATHETER N/A  Patient Active Problem List   Diagnosis Date Noted   Dyspareunia in female 04/05/2020   History of abnormal cervical Pap smear 03/10/2020   GIB (gastrointestinal bleeding) 02/28/2020   Elevated BUN    Prediabetes    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   Cerebral vasospasm    Sinus tachycardia    Dysphagia, post-stroke    Thrombocytopenia    Acute blood loss anemia    Brain aneurysm    ICH (intracerebral hemorrhage) (HCC) 01/05/2020   History of adenomatous polyp of colon 02/07/2016   Anatomical narrow angle, bilateral 12/14/2014   Fissure in ano 11/11/2014   Hemorrhoids 11/11/2014   Constipation 11/19/2013   GERD (gastroesophageal reflux disease) 03/24/2013   Irritable bowel syndrome with diarrhea 03/24/2013   Herpes zoster 05/14/2005   Abnormal Pap smear of cervix 05/15/1983    History of cervical dysplasia 05/15/1983    ONSET DATE: 12/02/2023  REFERRING DIAG: I63.9 (ICD-10-CM) - CVA (cerebral vascular accident) (HCC)   THERAPY DIAG:  Muscle weakness (generalized)  Other lack of coordination  Difficulty in walking, not elsewhere classified  Repeated falls  Abnormality of gait and mobility  Other abnormalities of gait and mobility  Rationale for Evaluation and Treatment: Rehabilitation  SUBJECTIVE:                                                                                                                                                                                             SUBJECTIVE STATEMENT:   Pt states she feels okay today.  Would like to review and increase HEP for transition to community/home management.  PERTINENT HISTORY:   Patient reports she was in hospital from [facility]- Sat (July 21st- 25th) with CVA affecting left side. I was supposed to have a knee replacement surgery in September but that is on hold for now. I was doing my prep for my colonoscopy and fell in the bathroom.  Patient reports having mild left sided weakness compared to her CVA in 2021 with heavy Right sided UE weakness. She reports bad OA affecting both knees as well. Previous CVA in 2021 affecting Right side. Reports going to ED  last Monday due to fall/CVA affecting Left side. Diagnosed with R CVA with Left Sided weakness.    PAIN:  Are you having pain? B pain knees, 3/10  PRECAUTIONS: Fall  WEIGHT BEARING RESTRICTIONS: No  FALLS: Has patient fallen in last 6 months? Yes. Number of falls fell with the stroke  PATIENT GOALS: Walk without any assistive device.  OBJECTIVE:  Note: Objective measures were completed at evaluation unless otherwise noted.  TREATMENT DATE : 05/27/2024  Nustep, rolling hill program, 4 min, levels 1-3, seat 10,  arms 10 use BLE and LUE throughout. R hand splint in place    HEP review/expansion  Seated:  LAQ  AROM x 12  Hip abduction/clam shell x 15   Hip flexion x 12  Heel raise/toe raise x 15  Sit<>stand x 8    Supine  SAQ  x 15  Bridge x 8  Bridge with hip abduction RTB x 8  Bridge with squeeze x 8  SLR x 10 bil   Sidelying:  Hip abduction x 8 bil  Reverse clam shell  x 10  Hip extension x 8     CGA for safety throughout session unless other wise noted as well as mutliple therapeutic rest breaks between bouts due to fatigue and need for hydration.   PATIENT EDUCATION: Education details: PT plan of care; Discussion of symptoms of CVA and to go to ED as soon as possible. Purpose of PT for balance, strength, coordination and endurance.   Limitations of PT given need for knee replacement and need to continue to see progress to justify additional PT.   Importance of continued mobility and gait to maintain endurance and functional progress and maximize outcomes when pt becomes surgical candidate.    Person educated: Patient Education method: Explanation Education comprehension: verbalized understanding  HOME EXERCISE PROGRAM: Access Code: [email or web address] URL: [email or web address] Date: 05/28/2024 Prepared by: Massie Dollar  Exercises - Seated Long Arc Quad  - 1 x daily - 3 x weekly - 3 sets - 10 reps - Seated March with Resistance  - 1 x daily - 3 x weekly - 3 sets - 10 reps - Seated Hip Abduction with Resistance  - 1 x daily - 3 x weekly - 3 sets - 10 reps - Seated Heel Toe Raises  - 1 x daily - 3 x weekly - 3 sets - 10 reps - Sit to Stand with Armchair  - 1 x daily - 3 x weekly - 3 sets - 10 reps - Supine Straight Leg Raises  - 1 x daily - 3 x weekly - 3 sets - 10 reps - Supine Bridge  - 1 x daily - 3 x weekly - 3 sets - 10 reps - Supine Bridge with Resistance Band  - 1 x daily - 3 x weekly - 3 sets - 10 reps - Supine Bridge with Mini Swiss Ball Between Knees  - 1  x daily - 3 x weekly - 3 sets - 10 reps - Sidelying Reverse Clamshell  - 1 x daily - 3 x weekly - 3 sets - 10 reps - Sidelying Hip Abduction  - 1 x daily - 3 x weekly - 3 sets - 10 reps - Sidelying Hip Extension in Abduction  - 1 x daily - 3 x weekly - 3 sets - 10 reps   GOALS: Goals reviewed with patient? Yes  SHORT TERM GOALS: Target date: 01/21/2024  Pt will be independent with HEP in order to improve strength and balance in order to decrease fall risk and improve function at home.  Baseline: EVAL - No current formal HEP in place 03/04/2024- Patient reports independent with all current HEP and riding a stationary bike.  Goal status: MET   LONG TERM GOALS: Target date: 05/27/2024  1.  Patient will complete five times sit to stand test in < 15 seconds indicating an increased LE strength and improved balance. Baseline: EVAL= 25.20 sec with  LUE Support 9/2: 23.36 without UE support and CGA-min assist. 20.10 sec with LUE support 03/04/2024- more difficulty raising without UE but performed 18.75 sec with LUE Support 04/06/24: 21.99s with L UE support on chair rail 12/4:16.59 sec with L UE assist  05/20/2024: 16.4 with L UE   Goal status: IN PROGRESS   2.  Patient will increase Berg Balance score by > 6 points to demonstrate decreased fall risk during functional activities. Baseline: EVAL: To be assessed next visit; 12/12/2023= 42/56 9/2: 44/56  10/16:45/56 12/4: 47 05/20/24: 46 Goal status: PROGRESSING   3.  Patient will reduce timed up and go to <11 seconds to reduce fall risk and demonstrate improved transfer/gait ability. Baseline: EVAL = 40 sec with RW 9/2: 18.33sec with SPC 10/16:17.47 sec with SPC 04/06/24: 20.17s w/ SPC 12/4:20.86 sec with SPC 16.58 sec with no AD  05/20/24:19.69 with walking stick 16.67 sec without AD  Goal status: PROGRESSING  4.  Patient will increase 10 meter walk test to >1.63m/s as to improve gait speed for better community ambulation and to reduce fall  risk. Baseline: EVAL: 0.65 m/s 9/2: 0.67 m/s with [facility] 9/9: 0.71 m/s without an AD and wearing 2.5 # AW 10/16: .75 m/s with [facility] 04/06/24: 0.51 m/s 12/4: .675 m/s with SPC, .70 m/s no AD 05/20/24: 0.14m/s with walking stick  Goal status: PROGRESSING  5. Patient will increase six minute walk test distance by 200 feet for progression to community ambulator and improve gait ability Baseline: EVAL= 482' 9/2: 465 feet using a SPC with CGA required seated rest break at 4:45min   10/16: 542 ft and full 6 minutes  04/06/24: 525 ft using SPC with CGA, 3 rest breaks taken throughout, @ 2:31 min, 3:62min, and 5:43min 12/4:625 ft total mixed SPC and no SPC initial 300 ft no AD only one standing rest break allowed around 400 ft, encouraged pt to not rest during test.  05/20/24: 479ft with multiple standing rest breaks.  Goal status: PROGRESS    ASSESSMENT:  CLINICAL IMPRESSION:  Pt put forth good effort towards HEP expansion. Pt feels ready to transition to home based exercise plan with hopes to have knee replacement scheduled within the next few months to alleviate L sided knee pain, and allow increased time in standing.  At this time, Pt will no longer benefit from skilled therapy to address remaining deficits in order to improve overall QoL and return to PLOF.   OBJECTIVE IMPAIRMENTS: Abnormal gait, decreased activity tolerance, decreased balance, decreased coordination, decreased endurance, decreased mobility, difficulty walking, decreased ROM, decreased strength, impaired UE functional use, and pain.   ACTIVITY LIMITATIONS: carrying, lifting, bending, sitting, standing, squatting, sleeping, stairs, and transfers  PARTICIPATION LIMITATIONS: meal prep, cleaning, laundry, driving, shopping, community activity, and yard work  PERSONAL FACTORS: 1-2 comorbidities: OA, previous CVA with significant R sided weakness are also affecting patient's functional outcome.   REHAB POTENTIAL: Good  CLINICAL  DECISION MAKING: Stable/uncomplicated  EVALUATION COMPLEXITY: Moderate  PLAN:  PT FREQUENCY: 1-2x/week  PT DURATION: 12 weeks  PLANNED INTERVENTIONS: [id number]- PT Re-evaluation, 97750- Physical Performance Testing, 97110-Therapeutic exercises, 97530- Therapeutic activity, [id number]- Neuromuscular re-education, 97535- Self Care, [id number]- Manual therapy, [id number]- Gait training, [id number]- Orthotic Initial, [phone]- Orthotic/Prosthetic subsequent, [phone]- Canalith repositioning, [id number]- Electrical stimulation (manual), [phone] (1-2 muscles), [id number] (3+ muscles)- Dry Needling, Patient/Family education, Balance training, Stair training, Taping, Joint mobilization, Joint manipulation, Spinal manipulation, Spinal mobilization, Compression bandaging, Vestibular training, DME instructions, Cryotherapy, and Moist heat   PLAN FOR NEXT SESSION:  N/A.   Massie Dollar PT, DPT  Physical Therapist - [facility]  2:47 PM 05/27/2024     "

## 2024-05-30 NOTE — Therapy (Signed)
 "  Outpatient Occupational Therapy Neuro Treatment Note   Patient Name: Erika Stanton MRN: [id number] DOB:March 23, 1956, 69 y.o., female Today's Date: 05/30/2024  PCP: Laurice Anes, MD REFERRING PROVIDER: Laurice Anes, MD   OT End of Session - 05/30/24 1926     Visit Number 44    Number of Visits 50    Date for Recertification  06/19/24    Authorization Time Period Reporting period beginning 05/13/24    Progress Note Due on Visit 50    OT Start Time 1400    OT Stop Time 1445    OT Time Calculation (min) 45 min    Equipment Utilized During Treatment transport chair, walking stick    Activity Tolerance Patient tolerated treatment well    Behavior During Therapy WFL for tasks assessed/performed         Past Medical History:  Diagnosis Date   Anatomical narrow angle, bilateral    Aneurysm    Asthma    Cerebral aneurysm    Cerebral vasospasm    Cognitive change    Dysphagia, post-stroke    GERD (gastroesophageal reflux disease)    Hemorrhoids    History of cervical dysplasia    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 no recurrent polyps.  suggest repeat surveillance study 11/2022.     COLONOSCOPY W/ POLYPECTOMY  06/2014   Dr Elder at [facility].  3 adenomatoous polyps, anal fissure.     ESOPHAGOGASTRODUODENOSCOPY (EGD) WITH PROPOFOL  N/A 01/27/2020   Procedure: ESOPHAGOGASTRODUODENOSCOPY (EGD) WITH PROPOFOL ;  Surgeon: Shila Gustav GAILS, MD;  Location: [facility] ENDOSCOPY;  Service: Endoscopy;  Laterality: N/A;   INSERTION OF PERMANENT INTRAPERITONEAL  CANNULA/CATH N/A    LAPAROSCOPIC   IR 3D INDEPENDENT WKST  01/06/2020   IR ANGIO INTRA EXTRACRAN SEL INTERNAL CAROTID BILAT MOD SED  01/06/2020   IR ANGIO VERTEBRAL SEL VERTEBRAL UNI L MOD SED  01/06/2020   IR ANGIOGRAM FOLLOW UP STUDY  01/06/2020   IR ANGIOGRAM FOLLOW UP STUDY  01/06/2020   IR ANGIOGRAM FOLLOW UP STUDY  01/06/2020   IR ANGIOGRAM FOLLOW UP STUDY  01/06/2020   IR ANGIOGRAM FOLLOW UP STUDY  01/06/2020   IR ANGIOGRAM FOLLOW UP STUDY  01/06/2020   IR ANGIOGRAM FOLLOW UP STUDY  01/06/2020   IR ANGIOGRAM FOLLOW UP STUDY  01/06/2020   IR ANGIOGRAM FOLLOW UP STUDY  01/06/2020   IR ANGIOGRAM FOLLOW UP STUDY  01/06/2020   IR NEURO EACH ADD'L AFTER BASIC UNI RIGHT (MS)  01/06/2020   IR TRANSCATH/EMBOLIZ  01/06/2020   MASTECTOMY     MOHS SURGERY N/A    RADIOLOGY WITH ANESTHESIA N/A 01/06/2020   Procedure: IR WITH ANESTHESIA FOR ANEURYSM;  Surgeon: Lanis Pupa, MD;  Location: [facility] OR;  Service: Radiology;  Laterality: N/A;   STENT SUPPORTED EMBOLIZATION OF RIGHT ICA ANEURYSM Right    TUBAL LIGATION     VENTRICULO-PERITONEAL SHUNT PLACEMENT / LAPAROSCOPIC INSERTION PERITONEAL CATHETER N/A    Patient Active Problem List  Diagnosis Date Noted   Dyspareunia in female 04/05/2020   History of abnormal cervical Pap smear 03/10/2020   GIB (gastrointestinal bleeding) 02/28/2020   Elevated BUN    Prediabetes    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   Cerebral vasospasm    Sinus tachycardia    Dysphagia, post-stroke    Thrombocytopenia    Acute blood loss anemia    Brain aneurysm    ICH (intracerebral hemorrhage) (HCC) 01/05/2020   History of adenomatous polyp of colon 02/07/2016   Anatomical narrow angle, bilateral 12/14/2014   Fissure in ano 11/11/2014   Hemorrhoids 11/11/2014   Constipation 11/19/2013   GERD (gastroesophageal reflux disease) 03/24/2013   Irritable bowel syndrome with diarrhea 03/24/2013   Herpes zoster 05/14/2005   Abnormal Pap smear of cervix  05/15/1983   History of cervical dysplasia 05/15/1983   ONSET DATE: 12/02/2023  REFERRING DIAG: L ischemic CVA  THERAPY DIAG:  Muscle weakness (generalized)  Other lack of coordination  Rationale for Evaluation and Treatment: Rehabilitation  SUBJECTIVE:  SUBJECTIVE STATEMENT: Pt reports doing well today.  PERTINENT HISTORY:  Pt. was admitted to [facility] from 12/02/23-12/06/23 with an Acute Right MCA CVA, Hydrocephalus with shunt  placement. History of Subarachnoid Hemorrhage with bilateral ICA coiled aneurysms. 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  PRECAUTIONS: None  WEIGHT BEARING RESTRICTIONS: No  PAIN: 05/27/24: No UE pain, 4/10 in knees Are you having pain? No  FALLS: Has patient fallen in last 6 months? Yes. Number of falls 1  LIVING ENVIRONMENT: Lives with: lives with their family and lives with their spouse Lives in: House/apartment Stairs: hand rails, 2 steps   Has following equipment at home: [name], cane  PLOF: Independent with basic ADLs, Spouse help as needed  PATIENT GOALS: Strengthening  OBJECTIVE:  Note: Objective measures were completed at Evaluation unless otherwise noted.  HAND DOMINANCE: Left  ADLs: Overall ADLs: spouse assists when needed, Pt. reports that she can do most tasks herself. Uses her left hand to engage in self-care tasks. Eating: Able to cut foods with a fork, husband assists as needed.  Grooming: husband assists with applying toothpaste, able to brush teeth and hair UB Dressing: Spouse assists with applying/fastening bra, Pt. Requires assistance with zippers, and buttons. LB Dressing: Spouse assists with tying shoes when needed, uses slip on shoes. Pt. Does not  zip pants. Toileting: Able to complete independently Bathing: Pt. Requires intermittent assistance with bathing tasks. Tub Shower transfers: Spouse assists with transfers when/if needed Equipment: Shower seat with back  IADLs: Shopping: Spouse typically does the shopping, isnt able to carry heavy objects.  Light housekeeping: Spouse typically does most of the cleaning around the home, Pt. Cleans bathroom, and laundry Pt. Is unable to carry laundry due to exacerbation of weakness.  Meal Prep: Spouse typically cooks and prepares meals.  Community mobility: Pt. Requires assistance using walker. Pt. reports that she is unable to carry heavy items, and typically does not do the shopping.  Medication management: Pt. is able to take care of meds with pillbox Financial management:  Spouse takes care of it, and makes online payments.  Handwriting: 50% legible  MOBILITY STATUS: Needs Assist: Pt. Requires use of a walker   POSTURE COMMENTS:  No Significant postural limitations Sitting balance: Good  ACTIVITY TOLERANCE: Activity tolerance:   FUNCTIONAL OUTCOME MEASURES: TBD  UPPER EXTREMITY ROM:    Active ROM Right eval Right 01/16/24 Right 03/02/24 Right 04/01/24  Right 05/13/24 Left eval Left 01/16/24 Left 03/02/24 Left 04/01/24 Left 05/13/24  Shoulder flexion 94(121) 100 with scaption (132) 126(146) 100 some scaption (145) 100 some scaption (155) 102(125) 134 with scaption (145) 142(148) 140 (155) 140 (155)  Shoulder abduction 75(106) 74 (98) 93(120) 58(90) 85 (131) 110(126) 140 (170) WNL 165 (180) 165 (180)  Shoulder adduction            Shoulder extension            Shoulder internal rotation            Shoulder external rotation            Elbow flexion 135(155)  150(155)   140(150)  150    Elbow extension 0  0   -10(-9)  -4(0)    Wrist flexion 40(44)  48(60)  50 (65) 42(58)  52(60)  88  Wrist extension 30(34)  48(60)  30 (30 limited by spasticity today) 40(50)  52(56)  55   Wrist ulnar deviation            Wrist radial deviation            Wrist pronation            Wrist supination            (Blank rows = not tested)  UPPER EXTREMITY MMT:     MMT Right eval Right 03/02/24 Right 04/01/24 Right 05/13/24 Left eval Left 03/02/24 Left 04/01/24 Left 05/13/24  Shoulder flexion 3-/5 3+/5 3- 3- 3-/5 4/5 4/5 4+/5  Shoulder abduction 3-/5 3-/5 3- 3- 3-/5 4/5 4/5 4+/5  Shoulder adduction          Shoulder extension          Shoulder internal rotation          Shoulder external rotation          Middle trapezius          Lower trapezius          Elbow flexion 4-/5 4+/5 4+ 4+ 4+/5 5/5 5/5 5/5  Elbow extension 4-/5 4+/5 4/5 4/5 4+/5 5/5 5/5 5/5  Wrist flexion 4-/5 4-/5 4/5 4/5 (within ROM limits) 3+/5 4/5 4+/5 4+/5  Wrist extension 4-/5 4-/5 4- 4-/5 (within ROM limits) 3+/5 4/5 4+/5 5/5  Wrist ulnar deviation          Wrist radial deviation          Wrist pronation          Wrist supination          (Blank rows = not tested)  HAND FUNCTION Eval:  Grip strength: Right: N/T lbs; Left: 43 lbs, Lateral pinch: Right: N/T lbs, Left: 9 lbs, and 3 point pinch: Right: N/T lbs, Left: 5 lbs Pt. Presents with flexor tightness in the right 5th digit, however is able to passively extend 5th digit within normal range. Pt. Also Presents with hyper extension in the 2nd and 3rd digit at PIP joints. Pt. Is able to achieve full Digit MP, PIP, and DIP PROM.  01/16/24: Grip strength: Left: 33 lbs; Lateral pinching: Left: 8 lbs; 3 point pinch: Left: 3 lbs  03/02/24: Grip strength: Left: 36 lbs; Lateral pinching: Left: 8 lbs; 3 point pinch: Left: 3 lbs  04/01/24: Grip strength: Left: (3 trials) 32 lbs, 26  lbs, 34 lbs; Lateral pinch: Pinch gauge unavailable (out for re-calibration)  05/13/24: Grip strength: Left: 30 lbs, 29 lbs, 28 lbs, Lateral pinch: Left: 9 lbs, 3 point pinch: Left: 10 lbs   COORDINATION: 9 Hole Peg test: Right: N/T sec; Left: 51 sec 01/16/24: Left: 1 min 1  sec 03/02/24:  Left: 1 min 1 sec 04/01/24: Left: 58 sec  05/13/24: Left: 43 sec   SENSATION: Not tested  EDEMA:   MUSCLE TONE: Fluctuating flexor tone in R hand.  COGNITION: Overall cognitive status: Within functional limits for tasks assessed  VISION: Subjective report:  Baseline vision:  Visual history:   VISION ASSESSMENT:  PERCEPTION:   PRAXIS:   OBSERVATIONS: Pt. presents with fluctuating flexor tone in the R hand, involving the 5th digit at the PIP/DIP joint. Pt. presents with hyperextension at the 2nd and 3rd digits at the PIP joints.                                                                                                                    TREATMENT DATE: 05/27/24 Self Care: -Facilitated handwriting skills with L hand; pt practiced printing, signing name, date, and 5 item grocery list, requiring min vc for completing circular components of letters (d, p, o) to increase legibility. -Min vc for paper positioning and use of dycem to stabilize paper on table and intermittent min A for tripod prehension on pen to increase pen stability. -Continued encouragement for pt to obtain wide grip pen for home practice (pt does have red built up foam in home as well).  Therapeutic Activity: -Facilitated L hand 3 point pinch strengthening working to move therapy resistant clothespins on/off a vertical dowel; min vc for prehension pattern to reduce substitution patterns.    PATIENT EDUCATION: Education details: Adult nurse Person educated: Patient Education method: Verbal cues Education comprehension: verbal cues required  HOME EXERCISE PROGRAM: -Pink theraputty; visual handout issued -Self and caregiver assisted PROM to R hand and wrist -Monitor splint with skin checks  GOALS: Goals reviewed with patient? Yes  SHORT TERM GOALS: Target date: 04/13/2024   Pt. Will be independent with HEP for UE functioning. Baseline: Eval: No current HEP; 01/16/24: pt reports  doing a little writing and squeezing a ball, but denies using putty; spouse stretches RUE daily 02/20/24:  Pt. Continues to do a little writing and squeezing a ball, but denies using putty; spouse stretches RUE 03/02/24: Pt. Husband assists Pt. With ROM to the right hand. Pt. reports doing hand exercises; 03/30/24: Spouse indep with HEP (spouse assists daily) Goal status: achieved  LONG TERM GOALS: Target date: 06/19/2024  Pt. Will improve [facility] skills to be able to manipulate small objects at home Baseline: 9 hole peg test: L: 51 seconds; 01/16/24: L 1 min 1 sec, 03/02/24: Left: 1 min. & 1 sec. Pt. Continues to work towards using the left to manipulate small objects; 04/01/24: L 58 sec (multiple trials to improve time); 05/13/24: L 43 sec  Goal status: Improved/Ongoing  2.  Pt.  Will increase BUE shoulder ROM by 10 degrees to be able to reach into cabinets and shelves.  Baseline: Shoulder flexion: R: 94(121), L: 102(125), Shoulder abduction: R: 75(106), L: 110(126); 01/16/24: Shoulder flexion: R: 100 with scaption (132), L: 134 with scaption (145), Shoulder abduction: R: 74 (98), L: 140 (170)02/20/24: Pt. Is improving with functional reaching through multiple planes with cues, and assist required. 03/02/24: Shoulder flexion: R: 126(146), L: 142(148), Shoulder abduction: R: 93(120), L: WNL; 03/30/24: ROM to be updated next session; limited by time constraints with poc review, though pt reports she does not reach into cabinets; 04/01/24: Shoulder flexion: R: 100 (145), L: 140 (155); Shoulder abd: R: 50 (90), L: 165 (180); pt reports she does not reach into cabinets; 05/13/24: Shoulder flexion: R: 100 (155), L: 140 (155); Shoulder abd: R: 85 (131), L: 165 (180) Goal status: Ongoing  3.  Pt. Will improve L grip strength by 5# to be able to securely grasp ADL items at home.  Baseline: Grip strength: R: NT, L: 43#; 01/16/24: L: 33#  03/02/24: 36#; 04/01/24: L: 34 lbs (best of 3 trials); 05/13/24: L 30 lbs (best of 3  trials) Goal status: Ongoing  4.  Pt. Will increase L lateral key pinch strength by 3#  to open wide mouth jars.  Baseline: Lateral key pinch: R: NT, L: 9#; 01/16/24: L: 8# 03/02/24: Pinch meter is out for calibration. Pt. Continues to have difficulty opening jars; 04/01/24: NT (pinch gauge not available); continued difficulty opening jars; 05/13/24: L: 9 lbs Goal status: Ongoing  5.  Pt. Will independently engage the right hand as a gross assist to the left hand 100% of the time during ADLs/IADLs. Baseline: Eval: Pt. With limited engagement of the right hand as a gross assist during tasks; 01/16/24: Observed <25% of the time during OT sessions 02/20/24: Pt. Continues to present with limited engagement of the RUE as a gross assist to the left hand approximately 25% of the time with cues. 03/02/24: Pt. Is starting to engage her right hand more as a gross assist to the left; 04/01/24: Pt engages the R hand as a fair assist to the L with intermittent vc from OT during tx sessions; pt admits she needs to use the R hand more at home; 05/13/24: Intermittent vc needed  Goal status: Ongoing  6.  Pt. Will be able to manipulate zippers, and buttons efficiently with modified independence Baseline: Eval:Right: NT Left: 51 sec; 01/16/24: Left: 1 min and 1 sec 02/20/24: Pt. continues to have difficulty manipulating zippers and buttons.03/02/24: Pt. continues to have difficulty manipulating zippers, and buttons, and may benefit from reviewing adaptive devices; 04/01/24: Pt reports that she continues to avoid clothes with fasteners; 05/13/24: Pt still wears slip on clothing without clothing fasteners, but does report improvement in manipulation of zipper and snaps on purse and wallet.  Goal status: Ongoing   7. Pt. Will improve handwriting legibility to 100% for one sentence in printed form for written correspondence.  Baseline: 50% legible in printed form for first name only; 01/16/24: 80-90% legible to print first name 1  of 4 attempts.  3 of 4 attempts 50% or less legible 02/20/24: first name only 100%, 25% for lists of words. 03/02/24:  first name only 100%, 25% for lists of words; 04/01/24: 100% legibility for a short 3 word sentence using L hand.  Goal status: achieved  8. Pt. Will independently, and efficiently transfer small 1/2 objects from the left hand to the right hand in midline  in preparation from reaching up to place items at a target with 100% accuracy with the right hand. Baseline: 03/02/24: Pt. Has progressed to transferring 1.5 objects between her hand in midline, and reach up to place them at targets with 60% accuracy; 03/30/24: Pt can transfer jumbo pegs and clothespins (~1/5) from R to L hand with extra time, repeat trials, and occasional dropping. Pt continues to reach toward a target with ~60% accuracy; 05/13/24: goal deferred d/t focus primarily on L hand strength/coordination.  Pt addresses RUE function with a myofascial release provider.   Goal Status: ongoing  9. Pt. Will increase BUE strength by 2 mm grades to assist with ADLs, and IADLs.  Baseline: 03/02/24: UE strength: Shoulder flexion: R: 3+/5 L: 4/5, abduction: R: 3-/5, L 4/5:  elbow flexion: R:4+/5, L: 5/5, extension: R: 4+/5, L:5/5, wrist flexion: R: 4-/5  L: 4/5 wrist extension: R: 4-/5 L: 4/5; 04/01/24: see chart; no significant changes; 05/13/24: improvement in L shoulder flex/abd and wrist ext since last assessment period  Goal Status: achieved for LUE; d/c RUE goals as pt addresses RUE function with a myofascial release provider  10.  Pt will use L hand to write a grocery list of 10 items with at least 75% legibility.    Baseline: 05/13/24: 25-50% legibility to write 1 word lists using L hand.   Goal status: New  ASSESSMENT: CLINICAL IMPRESSION: Pt continues to demo improved pen stability using wide grip pen.  Continue to encourage pt to obtain wide grip pen for home practice.  5 item grocery list 90% legible, with letter  height extending to ~2 line height on lined paper.  Pt was able to tolerate yellow and red clips well, with increased effort with green clips when using a 3 point pinch pattern.  Pt will continue to benefit from skilled OT to address above noted goals, working towards increased indep with daily tasks and reduced burden of care on caregiver.   PERFORMANCE DEFICITS: in functional skills including ADLs, IADLs, coordination, dexterity, edema, tone, ROM, strength, pain, Fine motor control, Gross motor control, endurance, and UE functional use, and psychosocial skills including coping strategies, environmental adaptation, habits, and routines and behaviors.   IMPAIRMENTS: are limiting patient from ADLs, IADLs, rest and sleep, leisure, and social participation.   CO-MORBIDITIES: may have co-morbidities  that affects occupational performance. Patient will benefit from skilled OT to address above impairments and improve overall function.  MODIFICATION OR ASSISTANCE TO COMPLETE EVALUATION: Min-Moderate modification of tasks or assist with assess necessary to complete an evaluation.  OT OCCUPATIONAL PROFILE AND HISTORY: Detailed assessment: Review of records and additional review of physical, cognitive, psychosocial history related to current functional performance.  CLINICAL DECISION MAKING: Moderate - several treatment options, min-mod task modification necessary  REHAB POTENTIAL: Good  EVALUATION COMPLEXITY: Moderate  PLAN:  OT FREQUENCY: 2x/week  OT DURATION: other: 5 weeks  PLANNED INTERVENTIONS: 97168 OT Re-evaluation, 97535 self care/ADL training, 02889 therapeutic exercise, 97530 therapeutic activity, 97112 neuromuscular re-education, 97140 manual therapy, 97018 paraffin, 02960 fluidotherapy, 97010 moist heat, 97010 cryotherapy, 97034 contrast bath, 97032 electrical stimulation (manual), 97760 Orthotic Initial, passive range of motion, energy conservation, coping strategies training,  patient/family education, and DME and/or AE instructions  RECOMMENDED OTHER SERVICES: PT   CONSULTED AND AGREED WITH PLAN OF CARE: Patient  PLAN FOR NEXT SESSION: see above  Inocente Blazing, MS, OTR/L     "

## 2024-06-01 ENCOUNTER — Ambulatory Visit: Admitting: Occupational Therapy

## 2024-06-01 ENCOUNTER — Ambulatory Visit

## 2024-06-01 ENCOUNTER — Ambulatory Visit: Admitting: Physical Therapy

## 2024-06-01 DIAGNOSIS — M6281 Muscle weakness (generalized): Secondary | ICD-10-CM

## 2024-06-01 DIAGNOSIS — R278 Other lack of coordination: Secondary | ICD-10-CM | POA: Diagnosis not present

## 2024-06-01 NOTE — Therapy (Unsigned)
 "  Outpatient Occupational Therapy Neuro Treatment Note   Patient Name: Erika Stanton MRN: [id number] DOB:[birth year], 69 y.o., female Today's Date: 06/01/2024  PCP: Laurice Anes, MD REFERRING PROVIDER: Laurice Anes, MD   OT End of Session - 06/01/24 1741     Visit Number 45    Number of Visits 50    Date for Recertification  06/19/24    OT Start Time 1450    OT Stop Time 1530    OT Time Calculation (min) 40 min    Equipment Utilized During Treatment transport chair, walking stick    Activity Tolerance Patient tolerated treatment well    Behavior During Therapy WFL for tasks assessed/performed         Past Medical History:  Diagnosis Date   Anatomical narrow angle, bilateral    Aneurysm    Asthma    Cerebral aneurysm    Cerebral vasospasm    Cognitive change    Dysphagia, post-stroke    GERD (gastroesophageal reflux disease)    Hemorrhoids    History of cervical dysplasia    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 no recurrent polyps.  suggest repeat surveillance study 11/2022.     COLONOSCOPY W/ POLYPECTOMY  06/2014   Dr Elder at [facility].  3 adenomatoous polyps, anal fissure.     ESOPHAGOGASTRODUODENOSCOPY (EGD) WITH PROPOFOL  N/A 01/27/2020   Procedure: ESOPHAGOGASTRODUODENOSCOPY (EGD) WITH PROPOFOL ;  Surgeon: Shila Gustav GAILS, MD;  Location: [facility];  Service: Endoscopy;  Laterality: N/A;   INSERTION OF PERMANENT INTRAPERITONEAL CANNULA/CATH N/A    LAPAROSCOPIC   IR 3D INDEPENDENT WKST  01/06/2020   IR ANGIO INTRA EXTRACRAN SEL  INTERNAL CAROTID BILAT MOD SED  01/06/2020   IR ANGIO VERTEBRAL SEL VERTEBRAL UNI L MOD SED  01/06/2020   IR ANGIOGRAM FOLLOW UP STUDY  01/06/2020   IR ANGIOGRAM FOLLOW UP STUDY  01/06/2020   IR ANGIOGRAM FOLLOW UP STUDY  01/06/2020   IR ANGIOGRAM FOLLOW UP STUDY  01/06/2020   IR ANGIOGRAM FOLLOW UP STUDY  01/06/2020   IR ANGIOGRAM FOLLOW UP STUDY  01/06/2020   IR ANGIOGRAM FOLLOW UP STUDY  01/06/2020   IR ANGIOGRAM FOLLOW UP STUDY  01/06/2020   IR ANGIOGRAM FOLLOW UP STUDY  01/06/2020   IR ANGIOGRAM FOLLOW UP STUDY  01/06/2020   IR NEURO EACH ADD'L AFTER BASIC UNI RIGHT (MS)  01/06/2020   IR TRANSCATH/EMBOLIZ  01/06/2020   MASTECTOMY     MOHS SURGERY N/A    RADIOLOGY WITH ANESTHESIA N/A 01/06/2020   Procedure: IR WITH ANESTHESIA FOR ANEURYSM;  Surgeon: Lanis Pupa, MD;  Location: [facility] OR;  Service: Radiology;  Laterality: N/A;   STENT SUPPORTED EMBOLIZATION OF RIGHT ICA ANEURYSM Right    TUBAL LIGATION     VENTRICULO-PERITONEAL SHUNT PLACEMENT / LAPAROSCOPIC INSERTION PERITONEAL CATHETER N/A    Patient Active Problem List   Diagnosis Date Noted   Dyspareunia in female 04/05/2020   History of abnormal cervical Pap smear 03/10/2020  GIB (gastrointestinal bleeding) 02/28/2020   Elevated BUN    Prediabetes    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   Cerebral vasospasm    Sinus tachycardia    Dysphagia, post-stroke    Thrombocytopenia    Acute blood loss anemia    Brain aneurysm    ICH (intracerebral hemorrhage) (HCC) 01/05/2020   History of adenomatous polyp of colon 02/07/2016   Anatomical narrow angle, bilateral 12/14/2014   Fissure in ano 11/11/2014   Hemorrhoids 11/11/2014   Constipation 11/19/2013   GERD (gastroesophageal reflux disease) 03/24/2013   Irritable bowel syndrome with diarrhea 03/24/2013   Herpes zoster 05/14/2005   Abnormal Pap smear of cervix 05/15/1983   History of cervical dysplasia 05/15/1983   ONSET DATE: 12/02/2023  REFERRING DIAG: L  ischemic CVA  THERAPY DIAG:  Muscle weakness (generalized)  Rationale for Evaluation and Treatment: Rehabilitation  SUBJECTIVE:  SUBJECTIVE STATEMENT: Pt reports doing well today.  PERTINENT HISTORY:  Pt. was admitted to [facility] from 12/02/23-12/06/23 with an Acute Right MCA CVA, Hydrocephalus with shunt  placement. History of Subarachnoid Hemorrhage with bilateral ICA coiled aneurysms. 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  PRECAUTIONS: None  WEIGHT BEARING RESTRICTIONS: No  PAIN: 05/27/24: No UE pain, 4/10 in knees Are you having pain? No  FALLS: Has patient fallen in last 6 months? Yes. Number of falls 1  LIVING ENVIRONMENT: Lives with: lives with their family and lives with their spouse Lives in: House/apartment Stairs: hand rails, 2 steps   Has following equipment at home: [name], cane  PLOF: Independent with basic ADLs, Spouse help as needed  PATIENT GOALS: Strengthening  OBJECTIVE:  Note: Objective measures were completed at Evaluation unless otherwise noted.  HAND DOMINANCE: Left  ADLs: Overall ADLs: spouse assists when needed, Pt. reports that she can do most tasks herself. Uses her left hand to engage in self-care tasks. Eating: Able to cut foods with a fork, husband assists as needed.  Grooming: husband assists with applying toothpaste, able to brush teeth and hair UB Dressing: Spouse assists with applying/fastening bra, Pt. Requires assistance with zippers, and buttons. LB Dressing: Spouse assists with tying shoes when needed, uses slip on shoes. Pt. Does not zip pants. Toileting: Able to complete independently Bathing: Pt. Requires intermittent assistance with bathing tasks. Tub Shower  transfers: Spouse assists with transfers when/if needed Equipment: Shower seat with back  IADLs: Shopping: Spouse typically does the shopping, isnt able to carry heavy objects.  Light housekeeping: Spouse typically does most of the cleaning around the home, Pt. Cleans bathroom, and laundry Pt. Is unable to carry laundry due to exacerbation of weakness.  Meal Prep: Spouse typically cooks and prepares meals.  Community mobility: Pt. Requires assistance using walker. Pt. reports that she is unable to carry heavy items, and typically does not do the shopping.  Medication management: Pt. is able to take care of meds with pillbox Financial management:  Spouse takes care of it, and makes online payments.  Handwriting: 50% legible  MOBILITY STATUS: Needs Assist: Pt. Requires use of a walker   POSTURE COMMENTS:  No Significant postural limitations Sitting balance: Good  ACTIVITY TOLERANCE: Activity tolerance:   FUNCTIONAL OUTCOME MEASURES: TBD  UPPER EXTREMITY ROM:    Active ROM Right eval Right 01/16/24 Right 03/02/24  Right 04/01/24  Right 05/13/24 Left eval Left 01/16/24 Left 03/02/24 Left 04/01/24 Left 05/13/24  Shoulder flexion 94(121) 100 with scaption (132) 126(146) 100 some scaption (145) 100 some scaption (155) 102(125) 134 with scaption (145) 142(148) 140 (155) 140 (155)  Shoulder abduction 75(106) 74 (98) 93(120) 58(90) 85 (131) 110(126) 140 (170) WNL 165 (180) 165 (180)  Shoulder adduction            Shoulder extension            Shoulder internal rotation            Shoulder external rotation            Elbow flexion 135(155)  150(155)   140(150)  150    Elbow extension 0  0   -10(-9)  -4(0)    Wrist flexion 40(44)  48(60)  50 (65) 42(58)  52(60)  88  Wrist extension 30(34)  48(60)  30 (30 limited by spasticity today) 40(50)  52(56)  55  Wrist ulnar deviation            Wrist radial deviation            Wrist pronation            Wrist supination            (Blank  rows = not tested)  UPPER EXTREMITY MMT:     MMT Right eval Right 03/02/24 Right 04/01/24 Right 05/13/24 Left eval Left 03/02/24 Left 04/01/24 Left 05/13/24  Shoulder flexion 3-/5 3+/5 3- 3- 3-/5 4/5 4/5 4+/5  Shoulder abduction 3-/5 3-/5 3- 3- 3-/5 4/5 4/5 4+/5  Shoulder adduction          Shoulder extension          Shoulder internal rotation          Shoulder external rotation          Middle trapezius          Lower trapezius          Elbow flexion 4-/5 4+/5 4+ 4+ 4+/5 5/5 5/5 5/5  Elbow extension 4-/5 4+/5 4/5 4/5 4+/5 5/5 5/5 5/5  Wrist flexion 4-/5 4-/5 4/5 4/5 (within ROM limits) 3+/5 4/5 4+/5 4+/5  Wrist extension 4-/5 4-/5 4- 4-/5 (within ROM limits) 3+/5 4/5 4+/5 5/5  Wrist ulnar deviation          Wrist radial deviation          Wrist pronation          Wrist supination          (Blank rows = not tested)  HAND FUNCTION Eval:  Grip strength: Right: N/T lbs; Left: 43 lbs, Lateral pinch: Right: N/T lbs, Left: 9 lbs, and 3 point pinch: Right: N/T lbs, Left: 5 lbs Pt. Presents with flexor tightness in the right 5th digit, however is able to passively extend 5th digit within normal range. Pt. Also Presents with hyper extension in the 2nd and 3rd digit at PIP joints. Pt. Is able to achieve full Digit MP, PIP, and DIP PROM.  01/16/24: Grip strength: Left: 33 lbs; Lateral pinching: Left: 8 lbs; 3 point pinch: Left: 3 lbs  03/02/24: Grip strength: Left: 36 lbs; Lateral pinching: Left: 8 lbs; 3 point pinch: Left: 3 lbs  04/01/24: Grip strength: Left: (3 trials) 32 lbs, 26 lbs, 34 lbs; Lateral pinch: Pinch gauge unavailable (out for re-calibration)  05/13/24: Grip strength: Left: 30 lbs, 29 lbs, 28 lbs, Lateral pinch: Left:  9 lbs, 3 point pinch: Left: 10 lbs   COORDINATION: 9 Hole Peg test: Right: N/T sec; Left: 51 sec 01/16/24: Left: 1 min 1 sec 03/02/24:  Left: 1 min 1 sec 04/01/24: Left: 58 sec  05/13/24: Left: 43 sec   SENSATION: Not tested  EDEMA:   MUSCLE  TONE: Fluctuating flexor tone in R hand.  COGNITION: Overall cognitive status: Within functional limits for tasks assessed  VISION: Subjective report:  Baseline vision:  Visual history:   VISION ASSESSMENT:  PERCEPTION:   PRAXIS:   OBSERVATIONS: Pt. presents with fluctuating flexor tone in the R hand, involving the 5th digit at the PIP/DIP joint. Pt. presents with hyperextension at the 2nd and 3rd digits at the PIP joints.                                                                                                                    TREATMENT DATE: 06/01/24  Self-care:  -Pt. worked on the IADL of writing formulating lists of words with letters completed in a designated lined space. -Pt. formulated the words using  1/2 upper case letters with 50% legibility with decreasing legibility to 25% towadrs the end of the task as Pt. Started to fatigue. -Pt. used a 1 foam adaptive roll with her left hand. -Pt. Required the paper to be secured to the table with tape.  PATIENT EDUCATION: Education details: Adult nurse Person educated: Patient Education method: Verbal cues Education comprehension: verbal cues required  HOME EXERCISE PROGRAM: -Pink theraputty; visual handout issued -Self and caregiver assisted PROM to R hand and wrist -Monitor splint with skin checks  GOALS: Goals reviewed with patient? Yes  SHORT TERM GOALS: Target date: 04/13/2024   Pt. Will be independent with HEP for UE functioning. Baseline: Eval: No current HEP; 01/16/24: pt reports doing a little writing and squeezing a ball, but denies using putty; spouse stretches RUE daily 02/20/24:  Pt. Continues to do a little writing and squeezing a ball, but denies using putty; spouse stretches RUE 03/02/24: Pt. Husband assists Pt. With ROM to the right hand. Pt. reports doing hand exercises; 03/30/24: Spouse indep with HEP (spouse assists daily) Goal status: achieved  LONG TERM GOALS: Target date:  06/19/2024  Pt. Will improve [facility] skills to be able to manipulate small objects at home Baseline: 9 hole peg test: L: 51 seconds; 01/16/24: L 1 min 1 sec, 03/02/24: Left: 1 min. & 1 sec. Pt. Continues to work towards using the left to manipulate small objects; 04/01/24: L 58 sec (multiple trials to improve time); 05/13/24: L 43 sec  Goal status: Improved/Ongoing  2.  Pt. Will increase BUE shoulder ROM by 10 degrees to be able to reach into cabinets and shelves.  Baseline: Shoulder flexion: R: 94(121), L: 102(125), Shoulder abduction: R: 75(106), L: 110(126); 01/16/24: Shoulder flexion: R: 100 with scaption (132), L: 134 with scaption (145), Shoulder abduction: R: 74 (98), L: 140 (170)02/20/24: Pt. Is improving with functional reaching through multiple planes with cues, and assist required.  03/02/24: Shoulder flexion: R: 126(146), L: 142(148), Shoulder abduction: R: 93(120), L: WNL; 03/30/24: ROM to be updated next session; limited by time constraints with poc review, though pt reports she does not reach into cabinets; 04/01/24: Shoulder flexion: R: 100 (145), L: 140 (155); Shoulder abd: R: 50 (90), L: 165 (180); pt reports she does not reach into cabinets; 05/13/24: Shoulder flexion: R: 100 (155), L: 140 (155); Shoulder abd: R: 85 (131), L: 165 (180) Goal status: Ongoing  3.  Pt. Will improve L grip strength by 5# to be able to securely grasp ADL items at home.  Baseline: Grip strength: R: NT, L: 43#; 01/16/24: L: 33#  03/02/24: 36#; 04/01/24: L: 34 lbs (best of 3 trials); 05/13/24: L 30 lbs (best of 3 trials) Goal status: Ongoing  4.  Pt. Will increase L lateral key pinch strength by 3#  to open wide mouth jars.  Baseline: Lateral key pinch: R: NT, L: 9#; 01/16/24: L: 8# 03/02/24: Pinch meter is out for calibration. Pt. Continues to have difficulty opening jars; 04/01/24: NT (pinch gauge not available); continued difficulty opening jars; 05/13/24: L: 9 lbs Goal status: Ongoing  5.  Pt. Will independently  engage the right hand as a gross assist to the left hand 100% of the time during ADLs/IADLs. Baseline: Eval: Pt. With limited engagement of the right hand as a gross assist during tasks; 01/16/24: Observed <25% of the time during OT sessions 02/20/24: Pt. Continues to present with limited engagement of the RUE as a gross assist to the left hand approximately 25% of the time with cues. 03/02/24: Pt. Is starting to engage her right hand more as a gross assist to the left; 04/01/24: Pt engages the R hand as a fair assist to the L with intermittent vc from OT during tx sessions; pt admits she needs to use the R hand more at home; 05/13/24: Intermittent vc needed  Goal status: Ongoing  6.  Pt. Will be able to manipulate zippers, and buttons efficiently with modified independence Baseline: Eval:Right: NT Left: 51 sec; 01/16/24: Left: 1 min and 1 sec 02/20/24: Pt. continues to have difficulty manipulating zippers and buttons.03/02/24: Pt. continues to have difficulty manipulating zippers, and buttons, and may benefit from reviewing adaptive devices; 04/01/24: Pt reports that she continues to avoid clothes with fasteners; 05/13/24: Pt still wears slip on clothing without clothing fasteners, but does report improvement in manipulation of zipper and snaps on purse and wallet.  Goal status: Ongoing   7. Pt. Will improve handwriting legibility to 100% for one sentence in printed form for written correspondence.  Baseline: 50% legible in printed form for first name only; 01/16/24: 80-90% legible to print first name 1 of 4 attempts.  3 of 4 attempts 50% or less legible 02/20/24: first name only 100%, 25% for lists of words. 03/02/24:  first name only 100%, 25% for lists of words; 04/01/24: 100% legibility for a short 3 word sentence using L hand.  Goal status: achieved  8. Pt. Will independently, and efficiently transfer small 1/2 objects from the left hand to the right hand in midline in preparation from reaching up to  place items at a target with 100% accuracy with the right hand. Baseline: 03/02/24: Pt. Has progressed to transferring 1.5 objects between her hand in midline, and reach up to place them at targets with 60% accuracy; 03/30/24: Pt can transfer jumbo pegs and clothespins (~1/5) from R to L hand with extra time, repeat trials, and occasional dropping. Pt  continues to reach toward a target with ~60% accuracy; 05/13/24: goal deferred d/t focus primarily on L hand strength/coordination.  Pt addresses RUE function with a myofascial release provider.   Goal Status: ongoing  9. Pt. Will increase BUE strength by 2 mm grades to assist with ADLs, and IADLs.  Baseline: 03/02/24: UE strength: Shoulder flexion: R: 3+/5 L: 4/5, abduction: R: 3-/5, L 4/5:  elbow flexion: R:4+/5, L: 5/5, extension: R: 4+/5, L:5/5, wrist flexion: R: 4-/5  L: 4/5 wrist extension: R: 4-/5 L: 4/5; 04/01/24: see chart; no significant changes; 05/13/24: improvement in L shoulder flex/abd and wrist ext since last assessment period  Goal Status: achieved for LUE; d/c RUE goals as pt addresses RUE function with a myofascial release provider  10.  Pt will use L hand to write a grocery list of 10 items with at least 75% legibility.    Baseline: 05/13/24: 25-50% legibility to write 1 word lists using L hand.   Goal status: New  ASSESSMENT: CLINICAL IMPRESSION: Pt continues to demo improved pen stability using wide grip pen.  Continue to encourage pt to obtain wide grip pen for home practice.  5 item grocery list 90% legible, with letter height extending to ~2 line height on lined paper.  Pt was able to tolerate yellow and red clips well, with increased effort with green clips when using a 3 point pinch pattern.  Pt. Continue. to benefit from skilled OT to address above noted goals, working towards increased indep with daily tasks and reduced burden of care on caregiver.   PERFORMANCE DEFICITS: in functional skills including ADLs, IADLs,  coordination, dexterity, edema, tone, ROM, strength, pain, Fine motor control, Gross motor control, endurance, and UE functional use, and psychosocial skills including coping strategies, environmental adaptation, habits, and routines and behaviors.   IMPAIRMENTS: are limiting patient from ADLs, IADLs, rest and sleep, leisure, and social participation.   CO-MORBIDITIES: may have co-morbidities  that affects occupational performance. Patient will benefit from skilled OT to address above impairments and improve overall function.  MODIFICATION OR ASSISTANCE TO COMPLETE EVALUATION: Min-Moderate modification of tasks or assist with assess necessary to complete an evaluation.  OT OCCUPATIONAL PROFILE AND HISTORY: Detailed assessment: Review of records and additional review of physical, cognitive, psychosocial history related to current functional performance.  CLINICAL DECISION MAKING: Moderate - several treatment options, min-mod task modification necessary  REHAB POTENTIAL: Good  EVALUATION COMPLEXITY: Moderate  PLAN:  OT FREQUENCY: 2x/week  OT DURATION: other: 5 weeks  PLANNED INTERVENTIONS: 97168 OT Re-evaluation, 97535 self care/ADL training, 02889 therapeutic exercise, 97530 therapeutic activity, 97112 neuromuscular re-education, 97140 manual therapy, 97018 paraffin, 02960 fluidotherapy, 97010 moist heat, 97010 cryotherapy, 97034 contrast bath, 97032 electrical stimulation (manual), 97760 Orthotic Initial, passive range of motion, energy conservation, coping strategies training, patient/family education, and DME and/or AE instructions  RECOMMENDED OTHER SERVICES: PT   CONSULTED AND AGREED WITH PLAN OF CARE: Patient  PLAN FOR NEXT SESSION: see above  Richardson Otter, MS, OTR/L      "

## 2024-06-03 ENCOUNTER — Ambulatory Visit

## 2024-06-03 ENCOUNTER — Ambulatory Visit: Admitting: Physical Therapy

## 2024-06-03 DIAGNOSIS — M6281 Muscle weakness (generalized): Secondary | ICD-10-CM

## 2024-06-03 DIAGNOSIS — R278 Other lack of coordination: Secondary | ICD-10-CM

## 2024-06-03 NOTE — Therapy (Unsigned)
 "  Outpatient Occupational Therapy Neuro Treatment Note   Patient Name: Erika Stanton MRN: [id number] DOB:[birth year], 69 y.o., female Today's Date: 06/03/2024  PCP: Laurice Anes, MD REFERRING PROVIDER: Laurice Anes, MD   OT End of Session - 06/03/24 1411     Visit Number 46    Number of Visits 50    Date for Recertification  06/19/24    Authorization Time Period Reporting period beginning 05/13/24    Progress Note Due on Visit 50    OT Start Time 1400    OT Stop Time 1445    OT Time Calculation (min) 45 min    Equipment Utilized During Treatment transport chair, walking stick    Activity Tolerance Patient tolerated treatment well    Behavior During Therapy WFL for tasks assessed/performed         Past Medical History:  Diagnosis Date   Anatomical narrow angle, bilateral    Aneurysm    Asthma    Cerebral aneurysm    Cerebral vasospasm    Cognitive change    Dysphagia, post-stroke    GERD (gastroesophageal reflux disease)    Hemorrhoids    History of cervical dysplasia    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 no recurrent polyps.  suggest repeat surveillance study 11/2022.     COLONOSCOPY W/ POLYPECTOMY  06/2014   Dr Elder at [facility].  3 adenomatoous polyps, anal fissure.     ESOPHAGOGASTRODUODENOSCOPY (EGD) WITH PROPOFOL  N/A 01/27/2020   Procedure: ESOPHAGOGASTRODUODENOSCOPY (EGD) WITH PROPOFOL ;  Surgeon: Shila Gustav GAILS, MD;  Location: [facility] ENDOSCOPY;  Service: Endoscopy;  Laterality: N/A;   INSERTION OF PERMANENT INTRAPERITONEAL  CANNULA/CATH N/A    LAPAROSCOPIC   IR 3D INDEPENDENT WKST  01/06/2020   IR ANGIO INTRA EXTRACRAN SEL INTERNAL CAROTID BILAT MOD SED  01/06/2020   IR ANGIO VERTEBRAL SEL VERTEBRAL UNI L MOD SED  01/06/2020   IR ANGIOGRAM FOLLOW UP STUDY  01/06/2020   IR ANGIOGRAM FOLLOW UP STUDY  01/06/2020   IR ANGIOGRAM FOLLOW UP STUDY  01/06/2020   IR ANGIOGRAM FOLLOW UP STUDY  01/06/2020   IR ANGIOGRAM FOLLOW UP STUDY  01/06/2020   IR ANGIOGRAM FOLLOW UP STUDY  01/06/2020   IR ANGIOGRAM FOLLOW UP STUDY  01/06/2020   IR ANGIOGRAM FOLLOW UP STUDY  01/06/2020   IR ANGIOGRAM FOLLOW UP STUDY  01/06/2020   IR ANGIOGRAM FOLLOW UP STUDY  01/06/2020   IR NEURO EACH ADD'L AFTER BASIC UNI RIGHT (MS)  01/06/2020   IR TRANSCATH/EMBOLIZ  01/06/2020   MASTECTOMY     MOHS SURGERY N/A    RADIOLOGY WITH ANESTHESIA N/A 01/06/2020   Procedure: IR WITH ANESTHESIA FOR ANEURYSM;  Surgeon: Lanis Pupa, MD;  Location: [facility] OR;  Service: Radiology;  Laterality: N/A;   STENT SUPPORTED EMBOLIZATION OF RIGHT ICA ANEURYSM Right    TUBAL LIGATION     VENTRICULO-PERITONEAL SHUNT PLACEMENT / LAPAROSCOPIC INSERTION PERITONEAL CATHETER N/A    Patient Active Problem List  Diagnosis Date Noted   Dyspareunia in female 04/05/2020   History of abnormal cervical Pap smear 03/10/2020   GIB (gastrointestinal bleeding) 02/28/2020   Elevated BUN    Prediabetes    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   Cerebral vasospasm    Sinus tachycardia    Dysphagia, post-stroke    Thrombocytopenia    Acute blood loss anemia    Brain aneurysm    ICH (intracerebral hemorrhage) (HCC) 01/05/2020   History of adenomatous polyp of colon 02/07/2016   Anatomical narrow angle, bilateral 12/14/2014   Fissure in ano 11/11/2014   Hemorrhoids 11/11/2014   Constipation 11/19/2013   GERD (gastroesophageal reflux disease) 03/24/2013   Irritable bowel syndrome with diarrhea 03/24/2013   Herpes zoster 05/14/2005   Abnormal Pap smear of cervix  05/15/1983   History of cervical dysplasia 05/15/1983   ONSET DATE: 12/02/2023  REFERRING DIAG: L ischemic CVA  THERAPY DIAG:  Muscle weakness (generalized)  Other lack of coordination  Rationale for Evaluation and Treatment: Rehabilitation  SUBJECTIVE:   SUBJECTIVE STATEMENT: Pt reports doing well today.  PERTINENT HISTORY: Pt. was admitted to [facility] from 12/02/23-12/06/23 with an Acute Right MCA CVA, Hydrocephalus with shunt  placement. History of Subarachnoid Hemorrhage with bilateral ICA coiled aneurysms. 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  PRECAUTIONS: None  WEIGHT BEARING RESTRICTIONS: No  PAIN: 06/03/24: No UE pain, 4/10 in knees Are you having pain? No  FALLS: Has patient fallen in last 6 months? Yes. Number of falls 1  LIVING ENVIRONMENT: Lives with: lives with their family and lives with their spouse Lives in: House/apartment Stairs: hand rails, 2 steps   Has following equipment at home: [name], cane  PLOF: Independent with basic ADLs, Spouse help as needed  PATIENT GOALS: Strengthening  OBJECTIVE:  Note: Objective measures were completed at Evaluation unless otherwise noted.  HAND DOMINANCE: Left  ADLs: Overall ADLs: spouse assists when needed, Pt. reports that she can do most tasks herself. Uses her left hand to engage in self-care tasks. Eating: Able to cut foods with a fork, husband assists as needed.  Grooming: husband assists with applying toothpaste, able to brush teeth and hair UB Dressing: Spouse assists with applying/fastening bra, Pt. Requires assistance with zippers, and buttons. LB Dressing: Spouse assists with tying shoes when needed, uses slip on shoes. Pt. Does not  zip pants. Toileting: Able to complete independently Bathing: Pt. Requires intermittent assistance with bathing tasks. Tub Shower transfers: Spouse assists with transfers when/if needed Equipment: Shower seat with back  IADLs: Shopping: Spouse typically does the shopping, isnt able to carry heavy objects.  Light housekeeping: Spouse typically does most of the cleaning around the home, Pt. Cleans bathroom, and laundry Pt. Is unable to carry laundry due to exacerbation of weakness.  Meal Prep: Spouse typically cooks and prepares meals.  Community mobility: Pt. Requires assistance using walker. Pt. reports that she is unable to carry heavy items, and typically does not do the shopping.  Medication management: Pt. is able to take care of meds with pillbox Financial management:  Spouse takes care of it, and makes online payments.  Handwriting: 50% legible  MOBILITY STATUS: Needs Assist: Pt. Requires use of a walker   POSTURE COMMENTS:  No Significant postural limitations Sitting balance: Good  ACTIVITY TOLERANCE: Activity tolerance:   FUNCTIONAL OUTCOME MEASURES: TBD  UPPER EXTREMITY ROM:    Active ROM Right eval Right 01/16/24 Right 03/02/24 Right 04/01/24  Right 05/13/24 Left eval Left 01/16/24 Left 03/02/24 Left 04/01/24 Left 05/13/24  Shoulder flexion 94(121) 100 with scaption (132) 126(146) 100 some scaption (145) 100 some scaption (155) 102(125) 134 with scaption (145) 142(148) 140 (155) 140 (155)  Shoulder abduction 75(106) 74 (98) 93(120) 58(90) 85 (131) 110(126) 140 (170) WNL 165 (180) 165 (180)  Shoulder adduction            Shoulder extension            Shoulder internal rotation            Shoulder external rotation            Elbow flexion 135(155)  150(155)   140(150)  150    Elbow extension 0  0   -10(-9)  -4(0)    Wrist flexion 40(44)  48(60)  50 (65) 42(58)  52(60)  88  Wrist extension 30(34)  48(60)  30 (30 limited by spasticity today) 40(50)  52(56)  55   Wrist ulnar deviation            Wrist radial deviation            Wrist pronation            Wrist supination            (Blank rows = not tested)  UPPER EXTREMITY MMT:     MMT Right eval Right 03/02/24 Right 04/01/24 Right 05/13/24 Left eval Left 03/02/24 Left 04/01/24 Left 05/13/24  Shoulder flexion 3-/5 3+/5 3- 3- 3-/5 4/5 4/5 4+/5  Shoulder abduction 3-/5 3-/5 3- 3- 3-/5 4/5 4/5 4+/5  Shoulder adduction          Shoulder extension          Shoulder internal rotation          Shoulder external rotation          Middle trapezius          Lower trapezius          Elbow flexion 4-/5 4+/5 4+ 4+ 4+/5 5/5 5/5 5/5  Elbow extension 4-/5 4+/5 4/5 4/5 4+/5 5/5 5/5 5/5  Wrist flexion 4-/5 4-/5 4/5 4/5 (within ROM limits) 3+/5 4/5 4+/5 4+/5  Wrist extension 4-/5 4-/5 4- 4-/5 (within ROM limits) 3+/5 4/5 4+/5 5/5  Wrist ulnar deviation          Wrist radial deviation          Wrist pronation          Wrist supination          (Blank rows = not tested)  HAND FUNCTION Eval:  Grip strength: Right: N/T lbs; Left: 43 lbs, Lateral pinch: Right: N/T lbs, Left: 9 lbs, and 3 point pinch: Right: N/T lbs, Left: 5 lbs Pt. Presents with flexor tightness in the right 5th digit, however is able to passively extend 5th digit within normal range. Pt. Also Presents with hyper extension in the 2nd and 3rd digit at PIP joints. Pt. Is able to achieve full Digit MP, PIP, and DIP PROM.  01/16/24: Grip strength: Left: 33 lbs; Lateral pinching: Left: 8 lbs; 3 point pinch: Left: 3 lbs  03/02/24: Grip strength: Left: 36 lbs; Lateral pinching: Left: 8 lbs; 3 point pinch: Left: 3 lbs  04/01/24: Grip strength: Left: (3 trials) 32 lbs, 26  lbs, 34 lbs; Lateral pinch: Pinch gauge unavailable (out for re-calibration)  05/13/24: Grip strength: Left: 30 lbs, 29 lbs, 28 lbs, Lateral pinch: Left: 9 lbs, 3 point pinch: Left: 10 lbs   COORDINATION: 9 Hole Peg test: Right: N/T sec; Left: 51 sec 01/16/24: Left: 1 min 1  sec 03/02/24:  Left: 1 min 1 sec 04/01/24: Left: 58 sec  05/13/24: Left: 43 sec   SENSATION: Not tested  EDEMA:   MUSCLE TONE: Fluctuating flexor tone in R hand.  COGNITION: Overall cognitive status: Within functional limits for tasks assessed  VISION: Subjective report:  Baseline vision:  Visual history:   VISION ASSESSMENT:  PERCEPTION:   PRAXIS:   OBSERVATIONS: Pt. presents with fluctuating flexor tone in the R hand, involving the 5th digit at the PIP/DIP joint. Pt. presents with hyperextension at the 2nd and 3rd digits at the PIP joints.                                                                                                                    TREATMENT DATE: 06/03/24 Self-care: -Handwriting: Used standard pen with built up foam grip and dycem to stabilize notepad.  Pt wrote numbers 1-10 to compile 10 item grocery list, requiring visual cue of rectangle for writing each number within this designated space for improved list organization.  Min vc for L forearm and hand positioning for better distal control and wrist isolation; intermittent min vc to formulate tripod grasp. -Compiled same list of 10 items, progressing to smaller letter height (letter height initially extending to 1.5 lines, reducing to 1 line on standard line paper).  Visual cueing provided with solid pink high lighter used to fill in 1 line for pt to write each of the 10 items within a smaller designated space.  Repeat vc and tc for resting ulnar hand and forearm on table for improved pen control and wrist isolation.     PATIENT EDUCATION: Education details: Adult nurse Person educated: Patient Education method: Verbal cues Education comprehension: verbal cues required  HOME EXERCISE PROGRAM: -Pink theraputty; visual handout issued -Self and caregiver assisted PROM to R hand and wrist -Monitor splint with skin checks  GOALS: Goals reviewed with patient? Yes  SHORT TERM GOALS: Target  date: 04/13/2024   Pt. Will be independent with HEP for UE functioning. Baseline: Eval: No current HEP; 01/16/24: pt reports doing a little writing and squeezing a ball, but denies using putty; spouse stretches RUE daily 02/20/24:  Pt. Continues to do a little writing and squeezing a ball, but denies using putty; spouse stretches RUE 03/02/24: Pt. Husband assists Pt. With ROM to the right hand. Pt. reports doing hand exercises; 03/30/24: Spouse indep with HEP (spouse assists daily) Goal status: achieved  LONG TERM GOALS: Target date: 06/19/2024  Pt. Will improve [facility] skills to be able to manipulate small objects at home Baseline: 9 hole peg test: L: 51 seconds; 01/16/24: L 1 min 1 sec, 03/02/24: Left: 1 min. & 1 sec. Pt. Continues to work  towards using the left to manipulate small objects; 04/01/24: L 58 sec (multiple trials to improve time); 05/13/24: L 43 sec  Goal status: Improved/Ongoing  2.  Pt. Will increase BUE shoulder ROM by 10 degrees to be able to reach into cabinets and shelves.  Baseline: Shoulder flexion: R: 94(121), L: 102(125), Shoulder abduction: R: 75(106), L: 110(126); 01/16/24: Shoulder flexion: R: 100 with scaption (132), L: 134 with scaption (145), Shoulder abduction: R: 74 (98), L: 140 (170)02/20/24: Pt. Is improving with functional reaching through multiple planes with cues, and assist required. 03/02/24: Shoulder flexion: R: 126(146), L: 142(148), Shoulder abduction: R: 93(120), L: WNL; 03/30/24: ROM to be updated next session; limited by time constraints with poc review, though pt reports she does not reach into cabinets; 04/01/24: Shoulder flexion: R: 100 (145), L: 140 (155); Shoulder abd: R: 50 (90), L: 165 (180); pt reports she does not reach into cabinets; 05/13/24: Shoulder flexion: R: 100 (155), L: 140 (155); Shoulder abd: R: 85 (131), L: 165 (180) Goal status: Ongoing  3.  Pt. Will improve L grip strength by 5# to be able to securely grasp ADL items at home.  Baseline: Grip  strength: R: NT, L: 43#; 01/16/24: L: 33#  03/02/24: 36#; 04/01/24: L: 34 lbs (best of 3 trials); 05/13/24: L 30 lbs (best of 3 trials) Goal status: Ongoing  4.  Pt. Will increase L lateral key pinch strength by 3#  to open wide mouth jars.  Baseline: Lateral key pinch: R: NT, L: 9#; 01/16/24: L: 8# 03/02/24: Pinch meter is out for calibration. Pt. Continues to have difficulty opening jars; 04/01/24: NT (pinch gauge not available); continued difficulty opening jars; 05/13/24: L: 9 lbs Goal status: Ongoing  5.  Pt. Will independently engage the right hand as a gross assist to the left hand 100% of the time during ADLs/IADLs. Baseline: Eval: Pt. With limited engagement of the right hand as a gross assist during tasks; 01/16/24: Observed <25% of the time during OT sessions 02/20/24: Pt. Continues to present with limited engagement of the RUE as a gross assist to the left hand approximately 25% of the time with cues. 03/02/24: Pt. Is starting to engage her right hand more as a gross assist to the left; 04/01/24: Pt engages the R hand as a fair assist to the L with intermittent vc from OT during tx sessions; pt admits she needs to use the R hand more at home; 05/13/24: Intermittent vc needed  Goal status: Ongoing  6.  Pt. Will be able to manipulate zippers, and buttons efficiently with modified independence Baseline: Eval:Right: NT Left: 51 sec; 01/16/24: Left: 1 min and 1 sec 02/20/24: Pt. continues to have difficulty manipulating zippers and buttons.03/02/24: Pt. continues to have difficulty manipulating zippers, and buttons, and may benefit from reviewing adaptive devices; 04/01/24: Pt reports that she continues to avoid clothes with fasteners; 05/13/24: Pt still wears slip on clothing without clothing fasteners, but does report improvement in manipulation of zipper and snaps on purse and wallet.  Goal status: Ongoing   7. Pt. Will improve handwriting legibility to 100% for one sentence in printed form for  written correspondence.  Baseline: 50% legible in printed form for first name only; 01/16/24: 80-90% legible to print first name 1 of 4 attempts.  3 of 4 attempts 50% or less legible 02/20/24: first name only 100%, 25% for lists of words. 03/02/24:  first name only 100%, 25% for lists of words; 04/01/24: 100% legibility for a short 3 word  sentence using L hand.  Goal status: achieved  8. Pt. Will independently, and efficiently transfer small 1/2 objects from the left hand to the right hand in midline in preparation from reaching up to place items at a target with 100% accuracy with the right hand. Baseline: 03/02/24: Pt. Has progressed to transferring 1.5 objects between her hand in midline, and reach up to place them at targets with 60% accuracy; 03/30/24: Pt can transfer jumbo pegs and clothespins (~1/5) from R to L hand with extra time, repeat trials, and occasional dropping. Pt continues to reach toward a target with ~60% accuracy; 05/13/24: goal deferred d/t focus primarily on L hand strength/coordination.  Pt addresses RUE function with a myofascial release provider.   Goal Status: ongoing  9. Pt. Will increase BUE strength by 2 mm grades to assist with ADLs, and IADLs.  Baseline: 03/02/24: UE strength: Shoulder flexion: R: 3+/5 L: 4/5, abduction: R: 3-/5, L 4/5:  elbow flexion: R:4+/5, L: 5/5, extension: R: 4+/5, L:5/5, wrist flexion: R: 4-/5  L: 4/5 wrist extension: R: 4-/5 L: 4/5; 04/01/24: see chart; no significant changes; 05/13/24: improvement in L shoulder flex/abd and wrist ext since last assessment period  Goal Status: achieved for LUE; d/c RUE goals as pt addresses RUE function with a myofascial release provider  10.  Pt will use L hand to write a grocery list of 10 items with at least 75% legibility.    Baseline: 05/13/24: 25-50% legibility to write 1 word lists using L hand.   Goal status: New  ASSESSMENT: CLINICAL IMPRESSION: 75% legibility for fist compilation of 10 item  grocery list, with letter height extending to 1.5 lines on lined paper.  2nd trial completed with ~50% legibility with pt being more challenged to write within 1 line on lined paper, but pt did benefit from visual cue of pink highlighted line to keep words straight/horizontal across paper.  Pt continues to benefit from built up foam grip, but does require reassurance of her attempts at formulating correct tripod grasp, occasionally requiring min A to formulate, and other times vc only.  Pt. continues to benefit from skilled OT to address above noted goals, working towards increased indep with daily tasks and reduced burden of care on caregiver.   PERFORMANCE DEFICITS: in functional skills including ADLs, IADLs, coordination, dexterity, edema, tone, ROM, strength, pain, Fine motor control, Gross motor control, endurance, and UE functional use, and psychosocial skills including coping strategies, environmental adaptation, habits, and routines and behaviors.   IMPAIRMENTS: are limiting patient from ADLs, IADLs, rest and sleep, leisure, and social participation.   CO-MORBIDITIES: may have co-morbidities  that affects occupational performance. Patient will benefit from skilled OT to address above impairments and improve overall function.  MODIFICATION OR ASSISTANCE TO COMPLETE EVALUATION: Min-Moderate modification of tasks or assist with assess necessary to complete an evaluation.  OT OCCUPATIONAL PROFILE AND HISTORY: Detailed assessment: Review of records and additional review of physical, cognitive, psychosocial history related to current functional performance.  CLINICAL DECISION MAKING: Moderate - several treatment options, min-mod task modification necessary  REHAB POTENTIAL: Good  EVALUATION COMPLEXITY: Moderate  PLAN:  OT FREQUENCY: 2x/week  OT DURATION: other: 5 weeks  PLANNED INTERVENTIONS: 97168 OT Re-evaluation, 97535 self care/ADL training, 02889 therapeutic exercise, 97530 therapeutic  activity, 97112 neuromuscular re-education, 97140 manual therapy, 97018 paraffin, 02960 fluidotherapy, 97010 moist heat, 97010 cryotherapy, 97034 contrast bath, 97032 electrical stimulation (manual), 97760 Orthotic Initial, passive range of motion, energy conservation, coping strategies training, patient/family education, and DME  and/or AE instructions  RECOMMENDED OTHER SERVICES: PT   CONSULTED AND AGREED WITH PLAN OF CARE: Patient  PLAN FOR NEXT SESSION: see above  Inocente Blazing, MS, OTR/L       "

## 2024-06-08 ENCOUNTER — Ambulatory Visit

## 2024-06-08 ENCOUNTER — Ambulatory Visit: Admitting: Physical Therapy

## 2024-06-10 ENCOUNTER — Ambulatory Visit: Admitting: Occupational Therapy

## 2024-06-10 ENCOUNTER — Ambulatory Visit: Admitting: Physical Therapy

## 2024-06-15 ENCOUNTER — Ambulatory Visit

## 2024-06-15 ENCOUNTER — Ambulatory Visit: Admitting: Physical Therapy

## 2024-06-17 ENCOUNTER — Ambulatory Visit: Admitting: Physical Therapy

## 2024-06-17 ENCOUNTER — Ambulatory Visit

## 2024-06-17 DIAGNOSIS — M6281 Muscle weakness (generalized): Secondary | ICD-10-CM

## 2024-06-17 DIAGNOSIS — R278 Other lack of coordination: Secondary | ICD-10-CM

## 2024-06-17 NOTE — Therapy (Unsigned)
 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 no recurrent polyps.  suggest repeat surveillance study 11/2022.     COLONOSCOPY W/ POLYPECTOMY  06/2014   Dr Elder at [facility].  3 adenomatoous polyps, anal fissure.     ESOPHAGOGASTRODUODENOSCOPY (EGD) WITH PROPOFOL  N/A 01/27/2020   Procedure: ESOPHAGOGASTRODUODENOSCOPY (EGD) WITH PROPOFOL ;  Surgeon: Shila Gustav GAILS, MD;  Location: [facility] ENDOSCOPY;  Service: Endoscopy;  Laterality: N/A;   INSERTION OF PERMANENT INTRAPERITONEAL CANNULA/CATH N/A    LAPAROSCOPIC   IR 3D INDEPENDENT WKST  01/06/2020   IR ANGIO INTRA EXTRACRAN SEL INTERNAL CAROTID BILAT MOD SED  01/06/2020   IR ANGIO VERTEBRAL SEL VERTEBRAL UNI L MOD SED  01/06/2020   IR ANGIOGRAM FOLLOW UP STUDY  01/06/2020   IR ANGIOGRAM FOLLOW UP STUDY  01/06/2020   IR ANGIOGRAM FOLLOW UP STUDY  01/06/2020   IR ANGIOGRAM FOLLOW UP STUDY  01/06/2020   IR ANGIOGRAM FOLLOW UP STUDY  01/06/2020   IR ANGIOGRAM FOLLOW UP STUDY  01/06/2020   IR ANGIOGRAM FOLLOW UP STUDY   01/06/2020   IR ANGIOGRAM FOLLOW UP STUDY  01/06/2020   IR ANGIOGRAM FOLLOW UP STUDY  01/06/2020   IR ANGIOGRAM FOLLOW UP STUDY  01/06/2020   IR NEURO EACH ADD'L AFTER BASIC UNI RIGHT (MS)  01/06/2020   IR TRANSCATH/EMBOLIZ  01/06/2020   MASTECTOMY     MOHS SURGERY N/A    RADIOLOGY WITH ANESTHESIA N/A 01/06/2020   Procedure: IR WITH ANESTHESIA FOR ANEURYSM;  Surgeon: Lanis Pupa, MD;  Location: [facility] OR;  Service: Radiology;  Laterality: N/A;   STENT SUPPORTED EMBOLIZATION OF RIGHT ICA ANEURYSM Right    TUBAL LIGATION     VENTRICULO-PERITONEAL SHUNT PLACEMENT / LAPAROSCOPIC INSERTION PERITONEAL CATHETER N/A    Patient Active Problem List   Diagnosis Date Noted   Dyspareunia in female 04/05/2020   History of abnormal cervical Pap smear 03/10/2020   GIB (gastrointestinal bleeding) 02/28/2020   Elevated BUN    Prediabetes    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   Cerebral vasospasm    Sinus tachycardia    Dysphagia, post-stroke    Thrombocytopenia    Acute blood loss anemia    Brain aneurysm    ICH (intracerebral hemorrhage) (HCC) 01/05/2020   History of adenomatous polyp of colon 02/07/2016   Anatomical narrow angle, bilateral 12/14/2014   Fissure in ano 11/11/2014   Hemorrhoids 11/11/2014   Constipation  11/19/2013   GERD (gastroesophageal reflux disease) 03/24/2013   Irritable bowel syndrome with diarrhea 03/24/2013   Herpes zoster 05/14/2005   Abnormal Pap smear of cervix 05/15/1983   History of cervical dysplasia 05/15/1983   ONSET DATE: 12/02/2023  REFERRING DIAG: L ischemic CVA  THERAPY DIAG:  No diagnosis found.  Rationale for Evaluation and Treatment: Rehabilitation  SUBJECTIVE:   SUBJECTIVE STATEMENT: Pt reports doing well today.  PERTINENT HISTORY: Pt. was admitted to [facility] from 12/02/23-12/06/23 with an Acute Right MCA CVA, Hydrocephalus with shunt  placement. History of Subarachnoid Hemorrhage with bilateral ICA coiled aneurysms. (History of Left  ACA Infarct s/p stent/coil of ruptured large RICA terminus aneurysm 8/21, Right ICA terminus aneurysm retreatment with coiling c/b clot formation s/p stent from MCA to distal intracranial ICA 6/24) PMHz includes: GERD, Hyperlipidemia, peripheral neuropathy, constipation due to immobility, and Breast CA.  PRECAUTIONS: None  WEIGHT BEARING RESTRICTIONS: No  PAIN: 06/03/24: No UE pain, 4/10 in knees Are you having pain? No  FALLS: Has patient fallen in last 6 months? Yes. Number of falls 1  LIVING ENVIRONMENT: Lives with: lives with their family and lives with their spouse Lives in: House/apartment Stairs: hand rails, 2 steps   Has following equipment at home: [name], cane  PLOF: Independent with basic ADLs, Spouse help as needed  PATIENT GOALS: Strengthening  OBJECTIVE:  Note: Objective measures were completed at Evaluation unless otherwise noted.  HAND DOMINANCE: Left  ADLs: Overall ADLs: spouse assists when needed, Pt. reports that she can do most tasks herself. Uses her left hand to engage in self-care tasks. Eating: Able to cut foods with a fork, husband assists as needed.  Grooming: husband assists with applying toothpaste, able to brush teeth and hair UB Dressing: Spouse assists with applying/fastening bra, Pt. Requires assistance with zippers, and buttons. LB Dressing: Spouse assists with tying shoes when needed, uses slip on shoes. Pt. Does not zip pants. Toileting: Able to complete independently Bathing: Pt. Requires intermittent assistance with bathing tasks. Tub Shower transfers: Spouse assists with transfers when/if needed Equipment: Shower seat with back  IADLs: Shopping: Spouse typically does the shopping, isnt able to carry heavy objects.  Light housekeeping: Spouse typically does most of the cleaning around the home, Pt. Cleans bathroom, and laundry Pt. Is unable to carry laundry due to exacerbation of weakness.  Meal Prep: Spouse typically cooks and prepares  meals.  Community mobility: Pt. Requires assistance using walker. Pt. reports that she is unable to carry heavy items, and typically does not do the shopping.  Medication management: Pt. is able to take care of meds with pillbox Financial management:  Spouse takes care of it, and makes online payments.  Handwriting: 50% legible  MOBILITY STATUS: Needs Assist: Pt. Requires use of a walker   POSTURE COMMENTS:  No Significant postural limitations Sitting balance: Good  ACTIVITY TOLERANCE: Activity tolerance:   FUNCTIONAL OUTCOME MEASURES: TBD  UPPER EXTREMITY ROM:    Active ROM Right eval Right 01/16/24 Right 03/02/24 Right 04/01/24  Right 05/13/24 Left eval Left 01/16/24 Left 03/02/24 Left 04/01/24 Left 05/13/24  Shoulder flexion 94(121) 100 with scaption (132) 126(146) 100 some scaption (145) 100 some scaption (155) 102(125) 134 with scaption (145) 142(148) 140 (155) 140 (155)  Shoulder abduction 75(106) 74 (98) 93(120) 58(90) 85 (131) 110(126) 140 (170) WNL 165 (180) 165 (180)  Shoulder adduction            Shoulder extension  Shoulder internal rotation            Shoulder external rotation            Elbow flexion 135(155)  150(155)   140(150)  150    Elbow extension 0  0   -10(-9)  -4(0)    Wrist flexion 40(44)  48(60)  50 (65) 42(58)  52(60)  88  Wrist extension 30(34)  48(60)  30 (30 limited by spasticity today) 40(50)  52(56)  55  Wrist ulnar deviation            Wrist radial deviation            Wrist pronation            Wrist supination            (Blank rows = not tested)  UPPER EXTREMITY MMT:     MMT Right eval Right 03/02/24 Right 04/01/24 Right 05/13/24 Left eval Left 03/02/24 Left 04/01/24 Left 05/13/24  Shoulder flexion 3-/5 3+/5 3- 3- 3-/5 4/5 4/5 4+/5  Shoulder abduction 3-/5 3-/5 3- 3- 3-/5 4/5 4/5 4+/5  Shoulder adduction          Shoulder extension          Shoulder internal rotation          Shoulder external rotation           Middle trapezius          Lower trapezius          Elbow flexion 4-/5 4+/5 4+ 4+ 4+/5 5/5 5/5 5/5  Elbow extension 4-/5 4+/5 4/5 4/5 4+/5 5/5 5/5 5/5  Wrist flexion 4-/5 4-/5 4/5 4/5 (within ROM limits) 3+/5 4/5 4+/5 4+/5  Wrist extension 4-/5 4-/5 4- 4-/5 (within ROM limits) 3+/5 4/5 4+/5 5/5  Wrist ulnar deviation          Wrist radial deviation          Wrist pronation          Wrist supination          (Blank rows = not tested)  HAND FUNCTION Eval:  Grip strength: Right: N/T lbs; Left: 43 lbs, Lateral pinch: Right: N/T lbs, Left: 9 lbs, and 3 point pinch: Right: N/T lbs, Left: 5 lbs Pt. Presents with flexor tightness in the right 5th digit, however is able to passively extend 5th digit within normal range. Pt. Also Presents with hyper extension in the 2nd and 3rd digit at PIP joints. Pt. Is able to achieve full Digit MP, PIP, and DIP PROM.  01/16/24: Grip strength: Left: 33 lbs; Lateral pinching: Left: 8 lbs; 3 point pinch: Left: 3 lbs  03/02/24: Grip strength: Left: 36 lbs; Lateral pinching: Left: 8 lbs; 3 point pinch: Left: 3 lbs  04/01/24: Grip strength: Left: (3 trials) 32 lbs, 26 lbs, 34 lbs; Lateral pinch: Pinch gauge unavailable (out for re-calibration)  05/13/24: Grip strength: Left: 30 lbs, 29 lbs, 28 lbs, Lateral pinch: Left: 9 lbs, 3 point pinch: Left: 10 lbs   06/17/24: Grip strength: Left: 37 lbs, 39 lbs, 44 lbs (average 40 lbs), Lateral pinch: Left: 11 lbs, 3 point pinch: Left: 12 lbs   COORDINATION: 9 Hole Peg test: Right: N/T sec; Left: 51 sec 01/16/24: Left: 1 min 1 sec 03/02/24:  Left: 1 min 1 sec 04/01/24: Left: 58 sec  05/13/24: Left: 43 sec   06/17/24: Left: 46 sec (average of 3)   SENSATION: Not tested  EDEMA:  MUSCLE TONE: Fluctuating flexor tone in R hand.  COGNITION: Overall cognitive status: Within functional limits for tasks assessed  VISION: Subjective report:  Baseline vision:  Visual history:   VISION ASSESSMENT:  PERCEPTION:    PRAXIS:   OBSERVATIONS: Pt. presents with fluctuating flexor tone in the R hand, involving the 5th digit at the PIP/DIP joint. Pt. presents with hyperextension at the 2nd and 3rd digits at the PIP joints.                                                                                                                    TREATMENT DATE: 06/17/24 Self-care: -Handwriting: Used standard pen with built up foam grip and dycem to stabilize notepad.  Pt wrote numbers 1-10 to compile 10 item grocery list, requiring visual cue of rectangle for writing each number within this designated space for improved list organization.  Min vc for L forearm and hand positioning for better distal control and wrist isolation; intermittent min vc to formulate tripod grasp. -Compiled same list of 10 items, progressing to smaller letter height (letter height initially extending to 1.5 lines, reducing to 1 line on standard line paper).  Visual cueing provided with solid pink high lighter used to fill in 1 line for pt to write each of the 10 items within a smaller designated space.  Repeat vc and tc for resting ulnar hand and forearm on table for improved pen control and wrist isolation.     PATIENT EDUCATION: Education details: Adult nurse Person educated: Patient Education method: Verbal cues Education comprehension: verbal cues required  HOME EXERCISE PROGRAM: -Pink theraputty; visual handout issued -Self and caregiver assisted PROM to R hand and wrist -Monitor splint with skin checks  GOALS: Goals reviewed with patient? Yes  SHORT TERM GOALS: Target date: 04/13/2024   Pt. Will be independent with HEP for UE functioning. Baseline: Eval: No current HEP; 01/16/24: pt reports doing a little writing and squeezing a ball, but denies using putty; spouse stretches RUE daily 02/20/24:  Pt. Continues to do a little writing and squeezing a ball, but denies using putty; spouse stretches RUE 03/02/24: Pt. Husband assists  Pt. With ROM to the right hand. Pt. reports doing hand exercises; 03/30/24: Spouse indep with HEP (spouse assists daily) Goal status: achieved  LONG TERM GOALS: Target date: 06/19/2024  Pt. Will improve [facility] skills to be able to manipulate small objects at home Baseline: 9 hole peg test: L: 51 seconds; 01/16/24: L 1 min 1 sec, 03/02/24: Left: 1 min. & 1 sec. Pt. Continues to work towards using the left to manipulate small objects; 04/01/24: L 58 sec (multiple trials to improve time); 05/13/24: L 43 sec  Goal status: Improved/Ongoing  2.  Pt. Will increase BUE shoulder ROM by 10 degrees to be able to reach into cabinets and shelves.  Baseline: Shoulder flexion: R: 94(121), L: 102(125), Shoulder abduction: R: 75(106), L: 110(126); 01/16/24: Shoulder flexion: R: 100 with scaption (132), L: 134 with scaption (145), Shoulder abduction: R: 74 (  98), L: 140 (170)02/20/24: Pt. Is improving with functional reaching through multiple planes with cues, and assist required. 03/02/24: Shoulder flexion: R: 126(146), L: 142(148), Shoulder abduction: R: 93(120), L: WNL; 03/30/24: ROM to be updated next session; limited by time constraints with poc review, though pt reports she does not reach into cabinets; 04/01/24: Shoulder flexion: R: 100 (145), L: 140 (155); Shoulder abd: R: 50 (90), L: 165 (180); pt reports she does not reach into cabinets; 05/13/24: Shoulder flexion: R: 100 (155), L: 140 (155); Shoulder abd: R: 85 (131), L: 165 (180); 06/17/24: R: 100 (150); L shoulder flexion 150 (160), shoulder abd: R: 75 (90), L: 165 (180) Goal status: Ongoing  3.  Pt. Will improve L grip strength by 5# to be able to securely grasp ADL items at home.  Baseline: Grip strength: R: NT, L: 43#; 01/16/24: L: 33#  03/02/24: 36#; 04/01/24: L: 34 lbs (best of 3 trials); 05/13/24: L 30 lbs (best of 3 trials) Goal status: Ongoing  4.  Pt. Will increase L lateral key pinch strength by 3#  to open wide mouth jars.  Baseline: Lateral key pinch: R:  NT, L: 9#; 01/16/24: L: 8# 03/02/24: Pinch meter is out for calibration. Pt. Continues to have difficulty opening jars; 04/01/24: NT (pinch gauge not available); continued difficulty opening jars; 05/13/24: L: 9 lbs Goal status: Ongoing  5.  Pt. Will independently engage the right hand as a gross assist to the left hand 100% of the time during ADLs/IADLs. Baseline: Eval: Pt. With limited engagement of the right hand as a gross assist during tasks; 01/16/24: Observed <25% of the time during OT sessions 02/20/24: Pt. Continues to present with limited engagement of the RUE as a gross assist to the left hand approximately 25% of the time with cues. 03/02/24: Pt. Is starting to engage her right hand more as a gross assist to the left; 04/01/24: Pt engages the R hand as a fair assist to the L with intermittent vc from OT during tx sessions; pt admits she needs to use the R hand more at home; 05/13/24: Intermittent vc needed  Goal status: Ongoing  6.  Pt. Will be able to manipulate zippers, and buttons efficiently with modified independence Baseline: Eval:Right: NT Left: 51 sec; 01/16/24: Left: 1 min and 1 sec 02/20/24: Pt. continues to have difficulty manipulating zippers and buttons.03/02/24: Pt. continues to have difficulty manipulating zippers, and buttons, and may benefit from reviewing adaptive devices; 04/01/24: Pt reports that she continues to avoid clothes with fasteners; 05/13/24: Pt still wears slip on clothing without clothing fasteners, but does report improvement in manipulation of zipper and snaps on purse and wallet; 06/17/24: I don't do those. Pt chooses slip on clothing to avoid fasteners.  Pt reports the purse and wallet fasteners are fine, but clothing remains challenging.  Goal status: Ongoing   7. Pt. Will improve handwriting legibility to 100% for one sentence in printed form for written correspondence.  Baseline: 50% legible in printed form for first name only; 01/16/24: 80-90% legible to print  first name 1 of 4 attempts.  3 of 4 attempts 50% or less legible 02/20/24: first name only 100%, 25% for lists of words. 03/02/24:  first name only 100%, 25% for lists of words; 04/01/24: 100% legibility for a short 3 word sentence using L hand.  Goal status: achieved  8. Pt. Will independently, and efficiently transfer small 1/2 objects from the left hand to the right hand in midline in preparation from reaching  up to place items at a target with 100% accuracy with the right hand. Baseline: 03/02/24: Pt. Has progressed to transferring 1.5 objects between her hand in midline, and reach up to place them at targets with 60% accuracy; 03/30/24: Pt can transfer jumbo pegs and clothespins (~1/5) from R to L hand with extra time, repeat trials, and occasional dropping. Pt continues to reach toward a target with ~60% accuracy; 05/13/24: goal deferred d/t focus primarily on L hand strength/coordination.  Pt addresses RUE function with a myofascial release provider.   Goal Status: deferred d/t addressing RUE function with a myofascial release provider  9. Pt. Will increase BUE strength by 2 mm grades to assist with ADLs, and IADLs.  Baseline: 03/02/24: UE strength: Shoulder flexion: R: 3+/5 L: 4/5, abduction: R: 3-/5, L 4/5:  elbow flexion: R:4+/5, L: 5/5, extension: R: 4+/5, L:5/5, wrist flexion: R: 4-/5  L: 4/5 wrist extension: R: 4-/5 L: 4/5; 04/01/24: see chart; no significant changes; 05/13/24: improvement in L shoulder flex/abd and wrist ext since last assessment period  Goal Status: achieved for LUE; d/c RUE goals as pt addresses RUE function with a myofascial release provider  10.  Pt will use L hand to write a grocery list of 10 items with at least 75% legibility.    Baseline: 05/13/24: 25-50% legibility to write 1 word lists using L hand.   Goal status: New  ASSESSMENT: CLINICAL IMPRESSION: 75% legibility for fist compilation of 10 item grocery list, with letter height extending to 1.5 lines on  lined paper.  2nd trial completed with ~50% legibility with pt being more challenged to write within 1 line on lined paper, but pt did benefit from visual cue of pink highlighted line to keep words straight/horizontal across paper.  Pt continues to benefit from built up foam grip, but does require reassurance of her attempts at formulating correct tripod grasp, occasionally requiring min A to formulate, and other times vc only.  Pt. continues to benefit from skilled OT to address above noted goals, working towards increased indep with daily tasks and reduced burden of care on caregiver.   PERFORMANCE DEFICITS: in functional skills including ADLs, IADLs, coordination, dexterity, edema, tone, ROM, strength, pain, Fine motor control, Gross motor control, endurance, and UE functional use, and psychosocial skills including coping strategies, environmental adaptation, habits, and routines and behaviors.   IMPAIRMENTS: are limiting patient from ADLs, IADLs, rest and sleep, leisure, and social participation.   CO-MORBIDITIES: may have co-morbidities  that affects occupational performance. Patient will benefit from skilled OT to address above impairments and improve overall function.  MODIFICATION OR ASSISTANCE TO COMPLETE EVALUATION: Min-Moderate modification of tasks or assist with assess necessary to complete an evaluation.  OT OCCUPATIONAL PROFILE AND HISTORY: Detailed assessment: Review of records and additional review of physical, cognitive, psychosocial history related to current functional performance.  CLINICAL DECISION MAKING: Moderate - several treatment options, min-mod task modification necessary  REHAB POTENTIAL: Good  EVALUATION COMPLEXITY: Moderate  PLAN:  OT FREQUENCY: 2x/week  OT DURATION: other: 5 weeks  PLANNED INTERVENTIONS: 97168 OT Re-evaluation, 97535 self care/ADL training, 02889 therapeutic exercise, 97530 therapeutic activity, 97112 neuromuscular re-education, 97140 manual  therapy, 97018 paraffin, 02960 fluidotherapy, 97010 moist heat, 97010 cryotherapy, 97034 contrast bath, 97032 electrical stimulation (manual), 97760 Orthotic Initial, passive range of motion, energy conservation, coping strategies training, patient/family education, and DME and/or AE instructions  RECOMMENDED OTHER SERVICES: PT   CONSULTED AND AGREED WITH PLAN OF CARE: Patient  PLAN FOR NEXT SESSION: see  above  Inocente Blazing, MS, OTR/L       "

## 2024-06-22 ENCOUNTER — Ambulatory Visit: Admitting: Physical Therapy

## 2024-06-22 ENCOUNTER — Ambulatory Visit

## 2024-06-24 ENCOUNTER — Ambulatory Visit

## 2024-06-24 ENCOUNTER — Ambulatory Visit: Admitting: Physical Therapy

## 2024-06-29 ENCOUNTER — Ambulatory Visit: Admitting: Physical Therapy

## 2024-06-29 ENCOUNTER — Ambulatory Visit

## 2024-07-01 ENCOUNTER — Ambulatory Visit: Admitting: Physical Therapy

## 2024-07-01 ENCOUNTER — Ambulatory Visit

## 2024-07-06 ENCOUNTER — Ambulatory Visit

## 2024-07-06 ENCOUNTER — Ambulatory Visit: Admitting: Physical Therapy

## 2024-07-08 ENCOUNTER — Ambulatory Visit: Admitting: Physical Therapy

## 2024-07-08 ENCOUNTER — Ambulatory Visit

## 2024-07-13 ENCOUNTER — Ambulatory Visit

## 2024-07-13 ENCOUNTER — Ambulatory Visit: Admitting: Physical Therapy

## 2024-07-15 ENCOUNTER — Ambulatory Visit

## 2024-07-15 ENCOUNTER — Ambulatory Visit: Admitting: Physical Therapy

## 2024-07-20 ENCOUNTER — Ambulatory Visit

## 2024-07-20 ENCOUNTER — Ambulatory Visit: Admitting: Physical Therapy

## 2024-07-22 ENCOUNTER — Ambulatory Visit

## 2024-08-03 ENCOUNTER — Ambulatory Visit

## 2024-08-05 ENCOUNTER — Ambulatory Visit

## 2024-08-10 ENCOUNTER — Ambulatory Visit

## 2024-08-12 ENCOUNTER — Ambulatory Visit

## 2024-08-17 ENCOUNTER — Ambulatory Visit

## 2024-08-19 ENCOUNTER — Ambulatory Visit

## 2024-08-24 ENCOUNTER — Ambulatory Visit

## 2024-08-26 ENCOUNTER — Ambulatory Visit

## 2024-08-31 ENCOUNTER — Ambulatory Visit

## 2024-09-02 ENCOUNTER — Ambulatory Visit

## 2024-09-07 ENCOUNTER — Ambulatory Visit

## 2024-09-09 ENCOUNTER — Ambulatory Visit

## 2024-09-14 ENCOUNTER — Ambulatory Visit

## 2024-09-16 ENCOUNTER — Ambulatory Visit

## 2024-09-21 ENCOUNTER — Ambulatory Visit

## 2024-09-23 ENCOUNTER — Ambulatory Visit

## 2024-09-28 ENCOUNTER — Ambulatory Visit

## 2024-09-30 ENCOUNTER — Ambulatory Visit

## 2024-10-07 ENCOUNTER — Ambulatory Visit

## 2024-10-12 ENCOUNTER — Ambulatory Visit

## 2024-10-14 ENCOUNTER — Ambulatory Visit

## 2024-10-19 ENCOUNTER — Ambulatory Visit

## 2024-10-21 ENCOUNTER — Ambulatory Visit

## 2024-10-26 ENCOUNTER — Ambulatory Visit

## 2024-10-28 ENCOUNTER — Ambulatory Visit

## 2024-11-02 ENCOUNTER — Ambulatory Visit
